# Patient Record
Sex: Male | Born: 1965 | Race: Black or African American | Hispanic: No | Marital: Single | Smoking: Former smoker
Health system: Southern US, Community
[De-identification: ages and names within clinical notes are randomized; demographics above are authoritative.]

## PROBLEM LIST (undated history)

## (undated) DIAGNOSIS — I482 Chronic atrial fibrillation, unspecified: Secondary | ICD-10-CM

## (undated) DIAGNOSIS — J189 Pneumonia, unspecified organism: Secondary | ICD-10-CM

## (undated) DIAGNOSIS — R9431 Abnormal electrocardiogram [ECG] [EKG]: Secondary | ICD-10-CM

## (undated) DIAGNOSIS — J9621 Acute and chronic respiratory failure with hypoxia: Secondary | ICD-10-CM

## (undated) DIAGNOSIS — R140 Abdominal distension (gaseous): Secondary | ICD-10-CM

## (undated) DIAGNOSIS — D62 Acute posthemorrhagic anemia: Secondary | ICD-10-CM

## (undated) DIAGNOSIS — F1410 Cocaine abuse, uncomplicated: Secondary | ICD-10-CM

## (undated) DIAGNOSIS — I472 Ventricular tachycardia: Secondary | ICD-10-CM

## (undated) DIAGNOSIS — R911 Solitary pulmonary nodule: Secondary | ICD-10-CM

## (undated) DIAGNOSIS — F1910 Other psychoactive substance abuse, uncomplicated: Secondary | ICD-10-CM

## (undated) DIAGNOSIS — I5043 Acute on chronic combined systolic (congestive) and diastolic (congestive) heart failure: Secondary | ICD-10-CM

## (undated) DIAGNOSIS — F1010 Alcohol abuse, uncomplicated: Secondary | ICD-10-CM

## (undated) DIAGNOSIS — Z992 Dependence on renal dialysis: Secondary | ICD-10-CM

## (undated) DIAGNOSIS — Z91199 Patient's noncompliance with other medical treatment and regimen due to unspecified reason: Secondary | ICD-10-CM

## (undated) DIAGNOSIS — K4090 Unilateral inguinal hernia, without obstruction or gangrene, not specified as recurrent: Secondary | ICD-10-CM

## (undated) DIAGNOSIS — I428 Other cardiomyopathies: Secondary | ICD-10-CM

## (undated) DIAGNOSIS — I5040 Unspecified combined systolic (congestive) and diastolic (congestive) heart failure: Secondary | ICD-10-CM

## (undated) DIAGNOSIS — I10 Essential (primary) hypertension: Secondary | ICD-10-CM

## (undated) DIAGNOSIS — N186 End stage renal disease: Secondary | ICD-10-CM

## (undated) DIAGNOSIS — I132 Hypertensive heart and chronic kidney disease with heart failure and with stage 5 chronic kidney disease, or end stage renal disease: Secondary | ICD-10-CM

## (undated) DIAGNOSIS — G2581 Restless legs syndrome: Secondary | ICD-10-CM

## (undated) DIAGNOSIS — F17210 Nicotine dependence, cigarettes, uncomplicated: Secondary | ICD-10-CM

## (undated) DIAGNOSIS — R0600 Dyspnea, unspecified: Secondary | ICD-10-CM

## (undated) DIAGNOSIS — R05 Cough: Secondary | ICD-10-CM

## (undated) DIAGNOSIS — K819 Cholecystitis, unspecified: Secondary | ICD-10-CM

## (undated) DIAGNOSIS — I2699 Other pulmonary embolism without acute cor pulmonale: Secondary | ICD-10-CM

## (undated) DIAGNOSIS — Z9119 Patient's noncompliance with other medical treatment and regimen: Secondary | ICD-10-CM

## (undated) DIAGNOSIS — I509 Heart failure, unspecified: Secondary | ICD-10-CM

## (undated) HISTORY — DX: Ventricular tachycardia: I47.2

## (undated) HISTORY — DX: Cocaine abuse, uncomplicated: F14.10

## (undated) HISTORY — DX: Cholecystitis, unspecified: K81.9

## (undated) HISTORY — DX: Cough: R05

## (undated) HISTORY — DX: Patient's noncompliance with other medical treatment and regimen: Z91.19

## (undated) HISTORY — DX: Other cardiomyopathies: I42.8

## (undated) HISTORY — DX: Unilateral inguinal hernia, without obstruction or gangrene, not specified as recurrent: K40.90

## (undated) HISTORY — DX: Abdominal distension (gaseous): R14.0

## (undated) HISTORY — DX: Unspecified combined systolic (congestive) and diastolic (congestive) heart failure: I50.40

## (undated) HISTORY — DX: Alcohol abuse, uncomplicated: F10.10

## (undated) HISTORY — DX: Solitary pulmonary nodule: R91.1

## (undated) HISTORY — DX: End stage renal disease: N18.6

## (undated) HISTORY — DX: Patient's noncompliance with other medical treatment and regimen due to unspecified reason: Z91.199

## (undated) HISTORY — DX: Nicotine dependence, cigarettes, uncomplicated: F17.210

## (undated) HISTORY — DX: Hypocalcemia: E83.51

## (undated) HISTORY — DX: Hypertensive heart and chronic kidney disease with heart failure and with stage 5 chronic kidney disease, or end stage renal disease: I13.2

## (undated) HISTORY — DX: Abnormal electrocardiogram (ECG) (EKG): R94.31

## (undated) HISTORY — DX: Essential (primary) hypertension: I10

## (undated) HISTORY — DX: Acute posthemorrhagic anemia: D62

## (undated) HISTORY — DX: Pneumonia, unspecified organism: J18.9

## (undated) HISTORY — DX: Other psychoactive substance abuse, uncomplicated: F19.10

---

## 2008-04-16 ENCOUNTER — Emergency Department (HOSPITAL_COMMUNITY): Admission: EM | Admit: 2008-04-16 | Discharge: 2008-04-17 | Payer: Self-pay | Admitting: Emergency Medicine

## 2009-06-20 ENCOUNTER — Inpatient Hospital Stay (HOSPITAL_COMMUNITY): Admission: EM | Admit: 2009-06-20 | Discharge: 2009-06-24 | Payer: Self-pay | Admitting: Emergency Medicine

## 2009-06-29 ENCOUNTER — Emergency Department (HOSPITAL_COMMUNITY): Admission: EM | Admit: 2009-06-29 | Discharge: 2009-06-29 | Payer: Self-pay | Admitting: Emergency Medicine

## 2009-07-13 ENCOUNTER — Ambulatory Visit: Payer: Self-pay | Admitting: Internal Medicine

## 2009-07-13 DIAGNOSIS — N186 End stage renal disease: Secondary | ICD-10-CM

## 2009-07-13 DIAGNOSIS — I132 Hypertensive heart and chronic kidney disease with heart failure and with stage 5 chronic kidney disease, or end stage renal disease: Secondary | ICD-10-CM

## 2009-07-13 HISTORY — DX: End stage renal disease: N18.6

## 2009-07-13 HISTORY — DX: Hypertensive heart and chronic kidney disease with heart failure and with stage 5 chronic kidney disease, or end stage renal disease: I13.2

## 2009-07-20 ENCOUNTER — Encounter (INDEPENDENT_AMBULATORY_CARE_PROVIDER_SITE_OTHER): Payer: Self-pay | Admitting: Internal Medicine

## 2009-07-20 LAB — CONVERTED CEMR LAB
BUN: 42 mg/dL — ABNORMAL HIGH (ref 6–23)
Calcium: 9.1 mg/dL (ref 8.4–10.5)
Chloride: 109 meq/L (ref 96–112)
Creatinine, Ser: 4.86 mg/dL — ABNORMAL HIGH (ref 0.40–1.50)

## 2009-11-13 ENCOUNTER — Telehealth (INDEPENDENT_AMBULATORY_CARE_PROVIDER_SITE_OTHER): Payer: Self-pay | Admitting: Internal Medicine

## 2009-11-28 ENCOUNTER — Encounter (INDEPENDENT_AMBULATORY_CARE_PROVIDER_SITE_OTHER): Payer: Self-pay | Admitting: *Deleted

## 2010-02-26 ENCOUNTER — Telehealth (INDEPENDENT_AMBULATORY_CARE_PROVIDER_SITE_OTHER): Payer: Self-pay | Admitting: Internal Medicine

## 2010-03-12 ENCOUNTER — Encounter (INDEPENDENT_AMBULATORY_CARE_PROVIDER_SITE_OTHER): Payer: Self-pay | Admitting: *Deleted

## 2010-03-18 ENCOUNTER — Encounter (INDEPENDENT_AMBULATORY_CARE_PROVIDER_SITE_OTHER): Payer: Self-pay | Admitting: Internal Medicine

## 2010-03-18 ENCOUNTER — Telehealth (INDEPENDENT_AMBULATORY_CARE_PROVIDER_SITE_OTHER): Payer: Self-pay | Admitting: Internal Medicine

## 2010-04-20 ENCOUNTER — Emergency Department (HOSPITAL_COMMUNITY): Admission: EM | Admit: 2010-04-20 | Discharge: 2010-04-20 | Payer: Self-pay | Admitting: Emergency Medicine

## 2010-07-14 ENCOUNTER — Emergency Department (HOSPITAL_COMMUNITY): Admission: EM | Admit: 2010-07-14 | Discharge: 2010-07-14 | Payer: Self-pay | Admitting: Emergency Medicine

## 2010-07-22 ENCOUNTER — Emergency Department (HOSPITAL_COMMUNITY): Admission: EM | Admit: 2010-07-22 | Discharge: 2010-07-22 | Payer: Self-pay | Admitting: Emergency Medicine

## 2010-10-20 ENCOUNTER — Inpatient Hospital Stay (HOSPITAL_COMMUNITY): Admission: EM | Admit: 2010-10-20 | Discharge: 2010-10-26 | Payer: Self-pay | Admitting: Emergency Medicine

## 2010-10-21 ENCOUNTER — Encounter (INDEPENDENT_AMBULATORY_CARE_PROVIDER_SITE_OTHER): Payer: Self-pay | Admitting: Internal Medicine

## 2010-10-22 ENCOUNTER — Encounter (INDEPENDENT_AMBULATORY_CARE_PROVIDER_SITE_OTHER): Payer: Self-pay | Admitting: Nephrology

## 2010-10-23 ENCOUNTER — Ambulatory Visit: Payer: Self-pay | Admitting: Vascular Surgery

## 2010-10-26 ENCOUNTER — Encounter (INDEPENDENT_AMBULATORY_CARE_PROVIDER_SITE_OTHER): Payer: Self-pay | Admitting: Internal Medicine

## 2010-11-26 ENCOUNTER — Ambulatory Visit: Payer: Self-pay | Admitting: Internal Medicine

## 2010-11-26 ENCOUNTER — Telehealth (INDEPENDENT_AMBULATORY_CARE_PROVIDER_SITE_OTHER): Payer: Self-pay | Admitting: Internal Medicine

## 2010-11-26 ENCOUNTER — Encounter (INDEPENDENT_AMBULATORY_CARE_PROVIDER_SITE_OTHER): Payer: Self-pay | Admitting: Internal Medicine

## 2010-11-26 DIAGNOSIS — F1410 Cocaine abuse, uncomplicated: Secondary | ICD-10-CM

## 2010-11-26 DIAGNOSIS — F1010 Alcohol abuse, uncomplicated: Secondary | ICD-10-CM

## 2010-11-26 DIAGNOSIS — N186 End stage renal disease: Secondary | ICD-10-CM | POA: Insufficient documentation

## 2010-11-26 DIAGNOSIS — Z992 Dependence on renal dialysis: Secondary | ICD-10-CM

## 2010-11-26 DIAGNOSIS — F17210 Nicotine dependence, cigarettes, uncomplicated: Secondary | ICD-10-CM

## 2010-11-26 HISTORY — DX: Nicotine dependence, cigarettes, uncomplicated: F17.210

## 2010-11-26 HISTORY — DX: Cocaine abuse, uncomplicated: F14.10

## 2010-11-26 HISTORY — DX: Alcohol abuse, uncomplicated: F10.10

## 2010-11-26 LAB — CONVERTED CEMR LAB
ALT: 8 units/L (ref 0–53)
AST: 11 units/L (ref 0–37)
Albumin: 4.4 g/dL (ref 3.5–5.2)
Basophils Absolute: 0 10*3/uL (ref 0.0–0.1)
Basophils Relative: 0 % (ref 0–1)
Calcium: 9.4 mg/dL (ref 8.4–10.5)
Chloride: 102 meq/L (ref 96–112)
Lymphocytes Relative: 32 % (ref 12–46)
MCHC: 32.6 g/dL (ref 30.0–36.0)
Monocytes Relative: 10 % (ref 3–12)
Neutro Abs: 2.9 10*3/uL (ref 1.7–7.7)
Neutrophils Relative %: 53 % (ref 43–77)
Potassium: 4.2 meq/L (ref 3.5–5.3)
RBC: 4.73 M/uL (ref 4.22–5.81)
RDW: 16.2 % — ABNORMAL HIGH (ref 11.5–15.5)
Total Protein: 7.5 g/dL (ref 6.0–8.3)

## 2010-12-08 ENCOUNTER — Encounter (INDEPENDENT_AMBULATORY_CARE_PROVIDER_SITE_OTHER): Payer: Self-pay | Admitting: Internal Medicine

## 2010-12-17 ENCOUNTER — Encounter (INDEPENDENT_AMBULATORY_CARE_PROVIDER_SITE_OTHER): Payer: Self-pay | Admitting: *Deleted

## 2010-12-19 ENCOUNTER — Encounter (INDEPENDENT_AMBULATORY_CARE_PROVIDER_SITE_OTHER): Payer: Self-pay | Admitting: Internal Medicine

## 2010-12-19 ENCOUNTER — Telehealth (INDEPENDENT_AMBULATORY_CARE_PROVIDER_SITE_OTHER): Payer: Self-pay | Admitting: Internal Medicine

## 2010-12-26 ENCOUNTER — Encounter (INDEPENDENT_AMBULATORY_CARE_PROVIDER_SITE_OTHER): Payer: Self-pay | Admitting: *Deleted

## 2011-01-14 NOTE — Letter (Signed)
Summary: REFERRAL//[facility]   REFERRAL//[facility]   Imported By: Roland Earl 03/27/2010 15:44:15  _____________________________________________________________________  External Attachment:    Type:   Image     Comment:   External Document

## 2011-01-14 NOTE — Progress Notes (Signed)
  Phone Note Outgoing Call   Summary of Call: Rec'd request for refills on lopressor. Patient not seen. No f/u since July 2010. Need to contact him to arrange f/u before meds refilled. Initial call taken by: Richardson Dopp PA-C,  February 26, 2010 5:32 PM  Follow-up for Phone Call        left msg with lady to have pt call.  He will have to have eligibility also.  Was only seen one time for one time hosp f/u and has never done eligibility. Follow-up by: Shellia Carwin CMA,  February 27, 2010 9:41 AM  Additional Follow-up for Phone Call Additional follow up Details #1::        Left message on answering machine for pt to return call  Additional Follow-up by: Shellia Carwin CMA,  March 11, 2010 2:16 PM    Additional Follow-up for Phone Call Additional follow up Details #2::    Left message on answering machine for pt to return call @ [phone]. Will also mail pt. a letter since 3 attemps have been made to reach pt.

## 2011-01-14 NOTE — Letter (Signed)
Summary: Discharge Summary  Discharge Summary   Imported By: Roland Earl 11/15/2010 10:35:52  _____________________________________________________________________  External Attachment:    Type:   Image     Comment:   External Document

## 2011-01-14 NOTE — Letter (Signed)
Summary: FAXED REQUESTED RECORDS TO [place]  FAXED REQUESTED RECORDS TO [facility]   Imported By: Roland Earl 03/18/2010 15:53:03  _____________________________________________________________________  External Attachment:    Type:   Image     Comment:   External Document

## 2011-01-14 NOTE — Progress Notes (Signed)
  Phone Note Call from Patient Call back at Home Phone [phone] Call back at [phone]   Summary of Call: Pt states that he was returning a phone called back from Dr [name], and he also wants to share that he needs refills from two bp medications [name] [name] ([place] [id number]) [name] MD Initial call taken by: Alexis Goodell,  March 18, 2010 12:16 PM  Follow-up for Phone Call        Pt has to have eligibility and an ov.  He has only been seen one time as a one time hosp f/u and has never had his elgibility done. Follow-up by: Shellia Carwin CMA,  March 18, 2010 4:20 PM  Additional Follow-up for Phone Call Additional follow up Details #1::        left a message to the pt to call back.Alexis Goodell  March 22, 2010 3:54 PM  left a message to the pt to call back.Alexis Goodell  March 25, 2010 1:15 PM  Left a message to the pt to call back.Alexis Goodell  March 28, 2010 1:52 PM He had set up an eligibility appointment on May 06, 2010 at 10:15.Alexis Goodell  March 28, 2010 1:58 PM    Additional Follow-up for Phone Call Additional follow up Details #2::    Thank youl. Follow-up by: Shellia Carwin CMA,  March 28, 2010 3:01 PM

## 2011-01-14 NOTE — Letter (Signed)
Summary: *HSN Results Follow up  [place], [place] [address]   Phone: [phone]  Fax: [phone]      03/12/2010   [facility] [address], [place]  [address]   Dear  [name],                            ____S.Drinkard,FNP   ____D. Gore,FNP       ____B. McPherson,MD   ____V. Rankins,MD    ____E. Mulberry,MD    ____N. Hassell Done, FNP  ____D. Jobe Igo, MD    ____K. Tomma Lightning, MD    ____Other     This letter is to inform you that your recent test(s):  _______Pap Smear    _______Lab Test     _______X-ray __x__Prescription request    _______ is within acceptable limits  _______ requires a medication change  _______ requires a follow-up lab visit  ___xx____ requires a follow-up visit with your provider   Comments:  You must complete eligibility and have an appointment with your doctor before we can refill your medications.  Please call to schedule your appointments.  Thank you.       _________________________________________________________ If you have any questions, please contact our office                     Sincerely,  Shellia Carwin CMA [facility]

## 2011-01-14 NOTE — Consult Note (Addendum)
NAMEELWAY, MCCULLUM              ACCOUNT NO.:  [id number]  MEDICAL RECORD NO.:  [id number]          PATIENT TYPE:  INP  LOCATION:  [id number]                         FACILITY:  [facility]  PHYSICIAN:  Conrad W[place]res, MD       DATE OF BIRTH:  1966-10-16  DATE OF CONSULTATION: DATE OF DISCHARGE:                                CONSULTATION   REASON FOR CONSULTATION:  Hemodialysis access, permanent.  REQUESTING PHYSICIAN:  Windy Kalata, MD  Personal care physician at this time were the hospitalist.  HISTORY OF PRESENT ILLNESS:  This 45 year old black man was admitted through the emergency room with shortness of breath, chest pain, and fatigue.  He has a history of uncontrolled hypertension due to poor compliance with medications and position appointments.  He has had several admissions due to this and has been found to have been in hypertensive crisis.  He has also been evaluated in July 2012 by Dr. Hassell Done for kidney disease.  He was admitted to the CCU for evaluation and a renal consult was obtained.  His chest pain was controlled.  Significant laboratory findings revealed a sodium 139, potassium 2.4, bicarb 31, BUN of 38, and creatinine 5.2 at time of admission.  His drug screen was significant for cocaine.  He was felt at that time by Dr. Mercy Moore to have acute-on-chronic kidney disease versus stage IV chronic kidney disease secondary to hypertension and cocaine abuse.  Dr. Mercy Moore feels that he should proceed with placement for permanent access and has requesting our opinion.  PAST MEDICAL HISTORY:  Hypertension.  CURRENT MEDICATIONS: 1. Multivitamin with iron daily. 2. Folic acid p.o. daily. 3. Vitamin B1 p.o. daily. 4. Imdur 30 mg p.o. daily. 5. Aspirin 81 mg p.o. daily. 6. Norvasc 10 mg p.o. daily. 7. Catapres 0.1 mg p.o. every 8 hours.  FAMILY HISTORY:  Significant for hypertension and diabetes mellitus.  SOCIAL HISTORY:  Significant for alcohol use.  He does admit 2  beers per day.  He did have a positive cocaine screening, and admits to marijuana usage.  He is not married.  He does have two children, age 21 and 69; and he works for a [employer].  REVIEW OF SYSTEMS:  Significant for shortness of breath, chest pain, fatigue on admission, and a currently depressed appetite.  The rest of his symptoms was unremarkable.  PHYSICAL EXAMINATION:  VITAL SIGNS:  Blood pressure was 163/124 with a heart rate of 83, respiratory rate of 18. GENERAL:  This is a young black male in no apparent distress.  He is sleeping, but he woke when the lights were turned on. HEENT:  NCAT.  EOMI.  Sclerae were nonicteric.  Mucous membranes were pink and moist. NECK:  Evaluation of the neck demonstrates the trachea to be midline. CHEST:  Atraumatic. LUNGS:  Clear to auscultation. CARDIAC:  Regular rate and rhythm. ABDOMEN:  Soft, nontender, nondistended.  Bowel sounds are present. EXTREMITIES:  He was able to move all extremities.  He is right-handed. Vein mapping was obtained, which demonstrated adequate pain in the left arm.  He does have several puncture sites of his vein  systems due to IV. The IVs at this time, have been removed. NEUROLOGICAL:  Nonfocal. PSYCHOLOGICAL:  Appropriate to situation.  LABORATORY DATA:  As previously-described.  ASSESSMENT AND PLAN:  This 45 year old man has uncontrolled hypertension and chronic kidney disease.  He is nearing hemodialysis stage and will need access.  His veins have been mapped, and the mapping has been reviewed.  Currently, he has in cc but it should be more than just 600- 700 later today.  It may be possible that this access can be obtained during this admission.  Scheduled for admitting or certainly as an outpatient.  I will discuss this patient with Dr. Dorna Mai.  His hypertension should be treated per the hospitalist and renal physicians.     Chad Cordial, PA   ______________________________ Conrad Rock, MD    KEL/MEDQ  D:  10/23/2010  T:  10/24/2010  Job:  [id number]  Electronically Signed by Adele Barthel MD on 01/14/2011 07:36:19 AM

## 2011-01-16 NOTE — Progress Notes (Signed)
Summary: needs BP check  Phone Note From Pharmacy   Summary of Call: Request from [facility] pharmacy to switch to combination BiDil as Isosorbid Diniatrate is not covered alone. Please have pt. come in for bp check 2 weeks after switches Hydralazine and Isosorbid dinitrate to Bidil.  Nurse visit Initial call taken by: Mack Hook MD,  December 19, 2010 10:09 AM  Follow-up for Phone Call        called [phone] line busy Isla Pence  December 23, 2010 2:25 PM  [id number] # no longer in service, contact # [phone] Left message on answer machine for pt. to return call. Bridgett Larsson RN  December 25, 2010 9:34 AM      Additional Follow-up for Phone Call Additional follow up Details #1::        Left message on answer machine for pt. to return call [phone]. Will mail letter. Bridgett Larsson RN  December 26, 2010 9:22 AM        New/Updated Medications: BIDIL 20-37.5 MG TABS (ISOSORB DINITRATE-HYDRALAZINE) 1 tab by mouth three times a day Prescriptions: AMLODIPINE BESYLATE 10 MG TABS (AMLODIPINE BESYLATE) 1 tab by mouth daily  #90 x 1   Entered and Authorized by:   Mack Hook MD   Signed by:   Mack Hook MD on 12/19/2010   Method used:   Printed then faxed to ...       [facility] (retail)       [place], [place]  [address]       Ph: [phone]       Fax: [phone]   RxID:   [id number] BIDIL 20-37.5 MG TABS (ISOSORB DINITRATE-HYDRALAZINE) 1 tab by mouth three times a day  #90 x 6   Entered and Authorized by:   Mack Hook MD   Signed by:   Mack Hook MD on 12/19/2010   Method used:   Printed then faxed to ...       [facility] (retail)       [address] [place], [place]  [address]       Ph: [phone]       Fax: [phone]   RxID:   [id number]

## 2011-01-16 NOTE — Medication Information (Signed)
Summary: MAP FAXED //[facility]  [facility] FAXED //[facility]   Imported By: Roland Earl 12/19/2010 13:46:06  _____________________________________________________________________  External Attachment:    Type:   Image     Comment:   External Document

## 2011-01-16 NOTE — Assessment & Plan Note (Signed)
Summary: HOSPITAL F/U [place]//[facility]   Vital Signs:  Patient profile:   45 year old male Weight:      161 pounds BMI:     23.86 Temp:     97.8 degrees F oral Pulse rate:   76 / minute Pulse rhythm:   regular Resp:     14 per minute BP sitting:   118 / 80  (left arm)  Vitals Entered By: Aquilla Solian CMA (November 26, 2010 12:22 PM) CC: XF/U for BP. Pt. ran out of BP meds for a month. Was feeling very tired and not eating and having HA's.  Is Patient Diabetic? No Pain Assessment Patient in pain? no       Does patient need assistance? Functional Status Self care Ambulation Normal   CC:  XF/U for BP. Pt. ran out of BP meds for a month. Was feeling very tired and not eating and having HA's. .  History of Present Illness: 45 yo male here again as hospital follow up almost 1 1/2 years after one and only previous visit.  Pt. hospitalized 11/6 - 11-12 for Hypertensive emergency with renal and cardiac involvement.  Was found to have chronic Kidney disease, stage IV with this hospitalization and to have cardiomyopathy with bost systolic and diastolic CHF.   Pt. states he has made some bad choices and has not made appts as he should for followup.  States his cocaine use has nothing to do with it.  1.  Hypertension:  Has remained on bp meds since hospitalization--will run out in 2 days, however.   2.  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  3.  CKD, stage IV:  Renal ultrasound with no hydronephrosis and changes consistent with medical renal disease bilaterally.  [name] [name] refused placement of AV fistula   4.  Alcohol and cocaine abuse;  pt. feels this is really not a big problem for him and is not interested in referral to [facility] or other form of treatment.  Calls his mother when tempted to  use.  Current Medications (verified): 1)  Norvasc 10 Mg Tabs (Amlodipine Besylate) .Marland Kitchen.. 1 By Mouth Once Daily 2)  Catapres 0.3 Mg Tabs (Clonidine Hcl) .Marland Kitchen.. 1 By Mouth Two Times A Day 3)  Lopressor 50 Mg Tabs (Metoprolol Tartrate) .Marland Kitchen.. 1 By Mouth Two Times A Day 4)  Amlodipine Besylate 10 Mg Tabs (Amlodipine Besylate) .... Take One Tablet By Mouth Every Day 5)  Isosorbide Dinitrate 20 Mg Tabs (Isosorbide Dinitrate) .... Take One Tablet By Mouth Three Times Daily 6)  Hydralazine Hcl 50 Mg Tabs (Hydralazine Hcl) .... Take One Tablet By Mouth 4 Times Daily 7)  Furosemide 40 Mg Tabs (Furosemide) .... Take One Tablet By Mouth Twice Daily 8)  Rocaltrol 0.25 Mcg Caps (Calcitriol) .... Take One Capsule By Mouth Every Day 9)  Clonidine Hcl 0.2 Mg Tabs (Clonidine Hcl) .... Take One Tablet By Mouth Every 8 Hours  Allergies (verified): No Known Drug Allergies  Family History: Mother, 84:  DM, Hypertension, overweight Father, 19:  Hypertension, Gout 3 Siblings:  brother and sister have hypertension--not sure if another sister has hypertension or not. 2 Biologic Children:  78 and 20:  Healthy  Social History: Currently unemployed [place] married Lives with mother. Children live with their mother here in town No significant other Tobacco Use:  Started age 89 yo.  Usually  smokes when drinking.  Smokes maybe 5 cigarettes in one week. Alcohol Use:  One beer daily.  Has had overuse in past, but not an issue currently. Drug Use:  Hx of drug abuse in 20s--Crack cocaine and cocaine.  Never used anything IV.  Started using Cocaine:  snorting powder in past year--used weekly.  Still using MJ every day.    Physical Exam  General:  NAD--appears much younger than stated age. Lungs:  Normal respiratory effort, chest expands symmetrically. Lungs are clear to auscultation, no crackles or wheezes. Heart:  Normal rate and regular rhythm. S1 and S2 normal without gallop, murmur, click, rub or other extra  sounds.  No S3 or S4 appreciated.  Radial pulses normal and equal Abdomen:  Bowel sounds positive,abdomen soft and non-tender without masses, organomegaly or hernias noted. Extremities:  Trace edema--can see slight indentation from socks.   Impression & Recommendations:  Problem # 1:  CARDIOMYOPATHY, HYPERTENSIVE (ICD-402.90) Consider switch from Clonidine to Labetalol if keeps next visit. BP currently controlled If remains complian, consider echo in a few months to evaluate for any improvement in function The following medications were removed from the medication list:    Lopressor 50 Mg Tabs (Metoprolol tartrate) .Marland Kitchen... 1 by mouth two times a day His updated medication list for this problem includes:    Amlodipine Besylate 10 Mg Tabs (Amlodipine besylate) .Marland Kitchen... 1 tab by mouth daily    Clonidine Hcl 0.3 Mg Tabs (Clonidine hcl) .Marland Kitchen... 1 tab by mouth two times a day    Furosemide 40 Mg Tabs (Furosemide) .Marland Kitchen... 1 tab by mouth two times a day    Hydralazine Hcl 50 Mg Tabs (Hydralazine hcl) .Marland Kitchen... 1 tab by mouth 4 times daily  Orders: T-Lipid Profile [id number]) T-Comprehensive Metabolic Panel (A999333) UA Dipstick w/o Micro (manual) [id number]) Nephrology Referral (Nephro) T-CBC w/Diff [phone])  Problem # 2:  CHRONIC KIDNEY DISEASE STAGE IV (SEVERE) (ICD-585.4)  Orders: T-Lipid Profile [id number]) T-Comprehensive Metabolic Panel (A999333) UA Dipstick w/o Micro (manual) [id number]) Nephrology Referral (Nephro) T-CBC w/Diff [id number])  Problem # 3:  COCAINE ABUSE (ICD-305.60) and alcohol abuse--pt. denies this, but apparently had mild withdrawal in hospital. He does not feel he needs to pursue treatment for addicton. Will continue to encourage him to do so.   Orders: T-Lipid Profile [id number]) T-Comprehensive Metabolic Panel (A999333) UA Dipstick w/o Micro (manual) (81002) T-CBC w/Diff [id number])  Complete Medication List: 1)  Amlodipine Besylate 10 Mg  Tabs (Amlodipine besylate) .Marland Kitchen.. 1 tab by mouth daily 2)  Clonidine Hcl 0.3 Mg Tabs (Clonidine hcl) .Marland Kitchen.. 1 tab by mouth two times a day 3)  Isosorbide Dinitrate 20 Mg Tabs (Isosorbide dinitrate) .Marland Kitchen.. 1 tab by mouth three times a day 4)  Furosemide 40 Mg Tabs (Furosemide) .Marland Kitchen.. 1 tab by mouth two times a day 5)  Hydralazine Hcl 50 Mg Tabs (Hydralazine hcl) .Marland Kitchen.. 1 tab by mouth 4 times daily 6)  Aspirin 81 Mg Tbec (Aspirin) .Marland Kitchen.. 1 tab by mouth daily 7)  Calcitriol 0.25 Mcg Caps (Calcitriol) .Marland Kitchen.. 1 tab by mouth daily 8)  Thiamine Hcl 100 Mg Tabs (Thiamine hcl) .Marland Kitchen.. 1 tab by mouth daily  Other Orders: Flu Vaccine 60yrs + [id number]) Admin 1st Vaccine [id number])  Patient Instructions: 1)  Follow up with Dr. [name] in 2 months --cardiomyopathy, hypertension Prescriptions: THIAMINE HCL 100 MG TABS (THIAMINE HCL) 1 tab by mouth daily  #30 x 6   Entered and Authorized by:   Mack Hook MD   Signed by:   Mack Hook  MD on 11/26/2010   Method used:   Faxed to ...       [facility] (retail)       [place], [place]  [address]       Ph: [phone] [place]       Fax: [phone]   RxID:   [id number] CALCITRIOL 0.25 MCG CAPS (CALCITRIOL) 1 tab by mouth daily  #30 x 6   Entered and Authorized by:   Mack Hook MD   Signed by:   Mack Hook MD on 11/26/2010   Method used:   Faxed to ...       [facility] (retail)       [place], [place]  [address]       Ph: [phone] [place]       Fax: [phone]   RxID:   [id number] HYDRALAZINE HCL 50 MG TABS (HYDRALAZINE HCL) 1 tab by mouth 4 times daily  #120 x 6   Entered and Authorized by:   Mack Hook MD   Signed by:   Mack Hook MD on 11/26/2010   Method used:   Faxed to ...       [facility] (retail)       [place], [place]  [address]       Ph:  [phone] [place]       Fax: [phone]   RxID:   [id number] FUROSEMIDE 40 MG TABS (FUROSEMIDE) 1 tab by mouth two times a day  #60 x 6   Entered and Authorized by:   Mack Hook MD   Signed by:   Mack Hook MD on 11/26/2010   Method used:   Faxed to ...       [facility] (retail)       [place], [place]  [address]       Ph: [phone]       Fax: [phone]   RxID:   [id number] ISOSORBIDE DINITRATE 20 MG TABS (ISOSORBIDE DINITRATE) 1 tab by mouth three times a day  #90 x 6   Entered and Authorized by:   Mack Hook MD   Signed by:   Mack Hook MD on 11/26/2010   Method used:   Faxed to ...       [facility] (retail)       [place], [place]  [address]       Ph: [phone] [place]       Fax: [phone]   RxID:   [id number] CLONIDINE HCL 0.3 MG TABS (CLONIDINE HCL) 1 tab by mouth two times a day  #60 x 6   Entered and Authorized by:   Mack Hook MD   Signed by:   Mack Hook MD on 11/26/2010   Method used:   Faxed to ...       [facility] (retail)       [place], [place]  [address]       Ph: [phone] [place]       Fax: [phone]   RxID:   [id number] AMLODIPINE BESYLATE 10 MG TABS (AMLODIPINE BESYLATE) 1 tab by mouth daily  #30 x  6   Entered and Authorized by:   Mack Hook MD   Signed by:   Mack Hook MD on 11/26/2010   Method used:   Faxed to ...       [facility] (retail)       [place], [place]  [address]       Ph: [phone] [place]       Fax: [phone]   RxID:   [id number]    Orders Added: 1)  Flu Vaccine 62yrs + [id number] 2)  Admin 1st Vaccine [id number] 3)  T-Lipid Profile [id number] 4)  T-Comprehensive Metabolic Panel [id number] 5)  UA Dipstick w/o  Micro (manual) [81002] 6)  Nephrology Referral [Nephro] 7)  T-CBC w/Diff [id number] 8)  Est. Patient Level IV [id number]   Immunizations Administered:  Influenza Vaccine # 1:    Vaccine Type: Fluvax 3+    Site: left deltoid    Mfr: GlaxoSmithKline    Dose: 0.5 ml    Route: IM    Given by: Aquilla Solian CMA    Exp. Date: 06/14/2011    Lot #: [id number]    VIS given: 07/09/10 version given November 26, 2010.  Flu Vaccine Consent Questions:    Do you have a history of severe allergic reactions to this vaccine? no    Any prior history of allergic reactions to egg and/or gelatin? no    Do you have a sensitivity to the preservative Thimersol? no    Do you have a past history of Guillan-Barre Syndrome? no    Do you currently have an acute febrile illness? no    Have you ever had a severe reaction to latex? no    Vaccine information given and explained to patient? yes   Immunizations Administered:  Influenza Vaccine # 1:    Vaccine Type: Fluvax 3+    Site: left deltoid    Mfr: GlaxoSmithKline    Dose: 0.5 ml    Route: IM    Given by: Aquilla Solian CMA    Exp. Date: 06/14/2011    Lot #: [id number]    VIS given: 07/09/10 version given November 26, 2010.   Appended Document: HOSPITAL F/U [place]//[facility]    Nurse Visit   Allergies: No Known Drug Allergies Laboratory Results   Urine Tests  Date/Time Received: November 26, 2010 2:56 PM   Routine Urinalysis   Color: yellow Glucose: negative   (Normal Range: Negative) Bilirubin: negative   (Normal Range: Negative) Ketone: negative   (Normal Range: Negative) Spec. Gravity: 1.020   (Normal Range: 1.003-1.035) Blood: negative   (Normal Range: Negative) pH: 5.0   (Normal Range: 5.0-8.0) Protein: >=300   (Normal Range: Negative) Urobilinogen: 0.2   (Normal Range: 0-1) Nitrite: negative   (Normal Range: Negative) Leukocyte Esterace: negative   (Normal Range: Negative)

## 2011-01-16 NOTE — Letter (Signed)
Summary: *HSN Results Follow up  [facility]  [address] [place], [place] [address]   Phone: [phone]  Fax: [phone]      12/17/2010   [facility] [address] [place], [place]  [address]   Dear  [name],                            Comments: MR [place] WE HAVE BEEN TRYING TO REACH YOU PLEASE, CALL [facility] TO SCHEDULE AN APPOINTMENT . THANK YOU FOR YOU ATTENTION        _________________________________________________________ If you have any questions, please contact our office                     Sincerely,  Maren Reamer [facility]

## 2011-01-16 NOTE — Letter (Signed)
Summary: Lipid Letter  [facility]  [address] [place], [place] [address]   Phone: [phone]  Fax: [phone]    12/08/2010  [name] [name] [address], [place]  [address]  Dear [name] [name]:  We have carefully reviewed your last lipid profile from 11/26/2010 and the results are noted below with a summary of recommendations for lipid management.    Cholesterol:       159     Goal: <200   HDL "good" Cholesterol:   96     Goal: >45   LDL "bad" Cholesterol:   43     Goal: <70   Triglycerides:       102     Goal: <150    Your cholesterol is excellent.  Your kidney status is about the same.  Your blood counts are okay.  Please follow up as planned    TLC Diet (Therapeutic Lifestyle Change): Saturated Fats & Transfatty acids should be kept < 7% of total calories ***Reduce Saturated Fats Polyunstaurated Fat can be up to 10% of total calories Monounsaturated Fat Fat can be up to 20% of total calories Total Fat should be no greater than 25-35% of total calories Carbohydrates should be 50-60% of total calories Protein should be approximately 15% of total calories Fiber should be at least 20-30 grams a day ***Increased fiber may help lower LDL Total Cholesterol should be < 200mg /day Consider adding plant stanol/sterols to diet (example: Benacol spread) ***A higher intake of unsaturated fat may reduce Triglycerides and Increase HDL    Adjunctive Measures (may lower LIPIDS and reduce risk of Heart Attack) include: Aerobic Exercise (20-30 minutes 3-4 times a week) Limit Alcohol Consumption Weight Reduction Aspirin 75-81 mg a day by mouth (if not allergic or contraindicated) Dietary Fiber 20-30 grams a day by mouth     Current Medications: 1)    Amlodipine Besylate 10 Mg Tabs (Amlodipine besylate) .Marland Kitchen.. 1 tab by mouth daily 2)    Clonidine Hcl 0.3 Mg Tabs (Clonidine hcl) .Marland Kitchen.. 1 tab by mouth two times a day 3)    Isosorbide Dinitrate 20  Mg Tabs (Isosorbide dinitrate) .Marland Kitchen.. 1 tab by mouth three times a day 4)    Furosemide 40 Mg Tabs (Furosemide) .Marland Kitchen.. 1 tab by mouth two times a day 5)    Hydralazine Hcl 50 Mg Tabs (Hydralazine hcl) .Marland Kitchen.. 1 tab by mouth 4 times daily 6)    Aspirin 81 Mg Tbec (Aspirin) .Marland Kitchen.. 1 tab by mouth daily 7)    Calcitriol 0.25 Mcg Caps (Calcitriol) .Marland Kitchen.. 1 tab by mouth daily 8)    Thiamine Hcl 100 Mg Tabs (Thiamine hcl) .Marland Kitchen.. 1 tab by mouth daily  If you have any questions, please call. We appreciate being able to work with you.   Sincerely,    [facility]

## 2011-01-16 NOTE — Progress Notes (Signed)
Summary: Renal referral  Phone Note Outgoing Call   Summary of Call: NOra--referral to Nephrology Initial call taken by: Mack Hook MD,  November 26, 2010 1:55 PM  Follow-up for Phone Call        [facility] [place] [facility] APPT Follow-up by: Maren Reamer,  November 27, 2010 6:08 PM

## 2011-01-16 NOTE — Letter (Signed)
Summary: Generic Letter  [facility]  [address] [place], [place] [address]   Phone: [phone]  Fax: [phone]        12/26/2010  [facility] [address] [place], [place]  [address]  Dear Mr. Gessel,  We have been unable to reach you by phone. Please call our office with your new contact information. Your blood pressure medication was changed so we need to have you come in for a blood pressure check two weeks after you have started the new medication.       Sincerely,   Bridgett Larsson RN

## 2011-02-25 LAB — COMPREHENSIVE METABOLIC PANEL
ALT: 11 U/L (ref 0–53)
AST: 17 U/L (ref 0–37)
AST: 18 U/L (ref 0–37)
Albumin: 2.5 g/dL — ABNORMAL LOW (ref 3.5–5.2)
Albumin: 2.6 g/dL — ABNORMAL LOW (ref 3.5–5.2)
Alkaline Phosphatase: 51 U/L (ref 39–117)
Alkaline Phosphatase: 56 U/L (ref 39–117)
CO2: 29 mEq/L (ref 19–32)
Chloride: 103 mEq/L (ref 96–112)
Chloride: 105 mEq/L (ref 96–112)
GFR calc Af Amer: 13 mL/min — ABNORMAL LOW (ref >60)
GFR calc Af Amer: 15 mL/min — ABNORMAL LOW (ref >60)
Potassium: 3.6 mEq/L (ref 3.5–5.1)
Potassium: 4 mEq/L (ref 3.5–5.1)
Sodium: 138 mEq/L (ref 135–145)
Total Bilirubin: 0.4 mg/dL (ref 0.3–1.2)
Total Bilirubin: 0.4 mg/dL (ref 0.3–1.2)

## 2011-02-25 LAB — PTH, INTACT AND CALCIUM: Calcium, Total (PTH): 8.2 mg/dL — ABNORMAL LOW (ref 8.4–10.5)

## 2011-02-25 LAB — BASIC METABOLIC PANEL WITH GFR
BUN: 37 mg/dL — ABNORMAL HIGH (ref 6–23)
Creatinine, Ser: 5.21 mg/dL — ABNORMAL HIGH (ref 0.4–1.5)
GFR calc non Af Amer: 12 mL/min — ABNORMAL LOW (ref >60)
Glucose, Bld: 92 mg/dL (ref 70–99)

## 2011-02-25 LAB — CK TOTAL AND CKMB (NOT AT ARMC)
CK, MB: 5.3 ng/mL — ABNORMAL HIGH (ref 0.3–4.0)
Relative Index: INVALID (ref 0.0–2.5)
Total CK: 74 U/L (ref 7–232)

## 2011-02-25 LAB — URINE MICROSCOPIC-ADD ON

## 2011-02-25 LAB — DIFFERENTIAL
Basophils Absolute: 0.1 K/uL (ref 0.0–0.1)
Basophils Relative: 1 % (ref 0–1)
Eosinophils Absolute: 0.3 10*3/uL (ref 0.0–0.7)
Eosinophils Relative: 6 % — ABNORMAL HIGH (ref 0–5)
Lymphocytes Relative: 32 % (ref 12–46)
Lymphs Abs: 1.8 10*3/uL (ref 0.7–4.0)
Monocytes Absolute: 0.5 K/uL (ref 0.1–1.0)
Monocytes Relative: 9 % (ref 3–12)
Neutro Abs: 2.9 K/uL (ref 1.7–7.7)
Neutrophils Relative %: 51 % (ref 43–77)

## 2011-02-25 LAB — RAPID URINE DRUG SCREEN, HOSP PERFORMED
Amphetamines: NOT DETECTED
Barbiturates: NOT DETECTED
Benzodiazepines: NOT DETECTED
Cocaine: POSITIVE — AB
Opiates: NOT DETECTED
Tetrahydrocannabinol: POSITIVE — AB

## 2011-02-25 LAB — LIPID PANEL
LDL Cholesterol: 34 mg/dL (ref 0–99)
Total CHOL/HDL Ratio: 1.8 RATIO
Triglycerides: 67 mg/dL (ref <150)
VLDL: 13 mg/dL (ref 0–40)

## 2011-02-25 LAB — HEPATIC FUNCTION PANEL
ALT: 13 U/L (ref 0–53)
AST: 31 U/L (ref 0–37)
Albumin: 3 g/dL — ABNORMAL LOW (ref 3.5–5.2)
Alkaline Phosphatase: 72 U/L (ref 39–117)
Bilirubin, Direct: 0.3 mg/dL (ref 0.0–0.3)
Indirect Bilirubin: 0.8 mg/dL (ref 0.3–0.9)
Total Bilirubin: 1.1 mg/dL (ref 0.3–1.2)
Total Protein: 6.6 g/dL (ref 6.0–8.3)

## 2011-02-25 LAB — CBC
HCT: 33.4 % — ABNORMAL LOW (ref 39.0–52.0)
HCT: 34.7 % — ABNORMAL LOW (ref 39.0–52.0)
HCT: 38.4 % — ABNORMAL LOW (ref 39.0–52.0)
Hemoglobin: 11.3 g/dL — ABNORMAL LOW (ref 13.0–17.0)
Hemoglobin: 12.1 g/dL — ABNORMAL LOW (ref 13.0–17.0)
Hemoglobin: 13.1 g/dL (ref 13.0–17.0)
MCH: 28.9 pg (ref 26.0–34.0)
MCH: 29 pg (ref 26.0–34.0)
MCHC: 34.1 g/dL (ref 30.0–36.0)
MCV: 84.4 fL (ref 78.0–100.0)
MCV: 84.8 fL (ref 78.0–100.0)
Platelets: 270 K/uL (ref 150–400)
RBC: 3.89 MIL/uL — ABNORMAL LOW (ref 4.22–5.81)
RBC: 4.53 MIL/uL (ref 4.22–5.81)
RDW: 14.8 % (ref 11.5–15.5)
WBC: 5.6 10*3/uL (ref 4.0–10.5)
WBC: 5.6 K/uL (ref 4.0–10.5)

## 2011-02-25 LAB — MRSA PCR SCREENING
MRSA by PCR: NEGATIVE
MRSA by PCR: NEGATIVE

## 2011-02-25 LAB — PROTIME-INR
INR: 1 (ref 0.00–1.49)
Prothrombin Time: 13.4 s (ref 11.6–15.2)

## 2011-02-25 LAB — BASIC METABOLIC PANEL
BUN: 38 mg/dL — ABNORMAL HIGH (ref 6–23)
BUN: 40 mg/dL — ABNORMAL HIGH (ref 6–23)
BUN: 47 mg/dL — ABNORMAL HIGH (ref 6–23)
BUN: 52 mg/dL — ABNORMAL HIGH (ref 6–23)
CO2: 26 mEq/L (ref 19–32)
CO2: 27 mEq/L (ref 19–32)
Calcium: 8.3 mg/dL — ABNORMAL LOW (ref 8.4–10.5)
Calcium: 8.4 mg/dL (ref 8.4–10.5)
Calcium: 8.6 mg/dL (ref 8.4–10.5)
Chloride: 106 mEq/L (ref 96–112)
Chloride: 90 mEq/L — ABNORMAL LOW (ref 96–112)
Chloride: 98 mEq/L (ref 96–112)
Creatinine, Ser: 5.25 mg/dL — ABNORMAL HIGH (ref 0.4–1.5)
GFR calc Af Amer: 14 mL/min — ABNORMAL LOW (ref >60)
GFR calc Af Amer: 15 mL/min — ABNORMAL LOW (ref >60)
GFR calc non Af Amer: 11 mL/min — ABNORMAL LOW (ref >60)
GFR calc non Af Amer: 12 mL/min — ABNORMAL LOW (ref >60)
Glucose, Bld: 104 mg/dL — ABNORMAL HIGH (ref 70–99)
Glucose, Bld: 66 mg/dL — ABNORMAL LOW (ref 70–99)
Glucose, Bld: 84 mg/dL (ref 70–99)
Potassium: 2.5 mEq/L — CL (ref 3.5–5.1)
Potassium: 3.4 mEq/L — ABNORMAL LOW (ref 3.5–5.1)
Potassium: 4 mEq/L (ref 3.5–5.1)
Sodium: 129 mEq/L — ABNORMAL LOW (ref 135–145)
Sodium: 139 mEq/L (ref 135–145)

## 2011-02-25 LAB — URINALYSIS, ROUTINE W REFLEX MICROSCOPIC
Bilirubin Urine: NEGATIVE
Glucose, UA: NEGATIVE mg/dL
Ketones, ur: NEGATIVE mg/dL
Leukocytes, UA: NEGATIVE
Protein, ur: >300 mg/dL — AB

## 2011-02-25 LAB — PHOSPHORUS: Phosphorus: 4.7 mg/dL — ABNORMAL HIGH (ref 2.3–4.6)

## 2011-02-25 LAB — CARDIAC PANEL(CRET KIN+CKTOT+MB+TROPI)
CK, MB: 5 ng/mL — ABNORMAL HIGH (ref 0.3–4.0)
Total CK: 42 U/L (ref 7–232)
Troponin I: 0.91 ng/mL (ref 0.00–0.06)
Troponin I: 0.98 ng/mL (ref 0.00–0.06)

## 2011-02-25 LAB — TROPONIN I: Troponin I: 1.06 ng/mL (ref 0.00–0.06)

## 2011-02-25 LAB — APTT: aPTT: 29 s (ref 24–37)

## 2011-02-25 LAB — TSH: TSH: 2.982 u[IU]/mL (ref 0.350–4.500)

## 2011-02-27 NOTE — Consult Note (Signed)
NAMEDONIVON, EULBERG              ACCOUNT NO.:  [id number]  MEDICAL RECORD NO.:  [id number]          PATIENT TYPE:  INP  LOCATION:  [id number]                         FACILITY:  [facility]  PHYSICIAN:  Curly Shores, MD      DATE OF BIRTH:  [birth year]  DATE OF CONSULTATION:  10/26/2010 DATE OF DISCHARGE:                                CONSULTATION   TIME OF CONSULT:  6:30 a.m.  REASON FOR CONSULTATION:  "Ventricular tachycardia."  HISTORY OF PRESENT ILLNESS:  A 45 year old African American male with a past medical history detailed below, presents to [facility] as a consult at the request of [facility] for further evaluation and recommendations regarding ventricular tachycardia seen on inpatient telemetry.  The patient was admitted approximately 5-6 days ago in the setting of positive urinary drug screen for cocaine, renal dysfunction and hypertensive urgency.  Tonight, the patient had approximately 11- second beat run of nonsustained ventricular tachycardia.  The patient denied any presyncopal or syncopal symptoms, chest pain, palpitations. No interventions were necessary.  The patient said he felt some minor "fluttering" in his heart.  Notably he is taking cocaine for over 15 years, has smoked marijuana extensively,  continues to smoke cigarettes and has a history of previous alcohol abuse.  ALLERGIES:  NONE REPORTED.  PAST MEDICAL HISTORY: 1. Hypertension. 2. Renal dysfunction.  The patient is currently being evaluated by     Nephrology to  determine whether he has CKD IV versus acute on     chronic renal disease.  He is currently hesitant to undergo     hemodialysis at this time.  MEDICATIONS:  The patient is not taking any medications, but previous discharge summary indicated that he is on antihypertensive therapy, but simply noncompliant.  SOCIAL HISTORY:  Patient has 2 adult children and 2 grandchildren.  He smokes approximately half a pack per day and has done  for the past 17 years.  He continues to smoke marijuana.  Drinks approximately 2 beers a day.  He does admit to doing cocaine and has done so for the past 15 years.  FAMILY HISTORY:  The patient has a positive history for hypertension and diabetes.  REVIEW OF SYSTEMS:  A 14-point review of systems is negative as stated above.  CODE STATUS:  The patient is a full code.  PHYSICAL EXAMINATION:  VITAL SIGNS:  Temperature 97.6, pulse is 67, respiratory rate 17, blood pressure is 138/92, 100% on room air. GENERAL:  No acute distress, but slightly anxious. HEENT:  NCAT, PERRL.  Dentition is fair.  EOMI. NECK:  Supple without lymphadenopathy.  No JVD. CVS:  Heart rate is normal.  Rhythm is regular.  S1 and S2 noted.  There is an S3. LUNGS:  Clear to auscultation without wheezes, rales or rhonchi. SKIN:  No rashes on the skin or lesions on the skin. ABDOMEN:  Soft, nontender, thin, otherwise NABS, otherwise benign. GU/RECTAL:  Deferred. EXTREMITIES:  No clubbing, cyanosis or edema is noted.  No rashes or lesions. MUSCULOSKELETAL:  No joint deformities, effusion, spine tenderness or CVA tenderness. NEURO:  Alert to person, place and time.  CN  II-XII grossly intact.  RADIOLOGY: 1. Chest x-ray shows no acute cardiopulmonary disease. 2. EKG:  Sinus rhythm with T-wave inversions from V2-V3, anterior     ischemia.  QTC is approximately 510.  LVH was noted. 3. Telemetry:  Monomorphic approximately 10-11 second run of     nonsustained ventricular tachycardia.  Ventricular rate     approximately 115 beats per minute.  LABORATORY DATA:  White count 5.4 and 11.3, glucose 266, creatinine 5172, check home values.  Positive urine drug screen for cocaine.  ASSESSMENT:  A 44 year old [place] American male with past medical history detailed above, presents arrhythmic.  [place] consulted for further evaluation and management regarding ventricular tachycardia.  Nonsustained ventricular  tachycardia (supraventricular tachycardia). Occurred approximately at 0341 a.m. today on October 26, 2010.  The patient had approximately 11-second run of monomorphic ventricular tachycardia at 150 beats per minute.  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  Unfortunately, the patient has used cocaine within the past 10 days and has not been compliant with medications including his antihypertensive medications.  Based on the morphology of the ventricular tachycardia, ischemia is less likely as a cause of this ventricular tachycardia.  Furthermore, the patient is hesitant to undergo hemodialysis at this time and with his renal dysfunction, cardiac catheterization is not an option currently.  RECOMMENDATIONS:  Allow the patient to have a sufficient washout from cocaine.  Ideally, he would benefit from being on approximately 6-9 months duration of optimal medical management for heart failure.  During that course and time, the patient will be followed up closely as an outpatient and reassessed in 6-9 months to see whether his EF has improved.  Should the patient have continued runs of nonsustained ventricular tachycardia after his cocaine has washed out of his system and prior to discharge, we need to discuss the role of a __________ test in the interim versus the clinical benefits and risks regarding ICD implantation given his likely noncompliance that will likely be evident when he leaves the hospital.  We will have the primary team follow up on echocardiogram.  Otherwise, no acute concerns at this point in time.     Curly Shores, MD   ______________________________ Curly Shores, MD    SS/MEDQ  D:  10/26/2010  T:  10/26/2010  Job:  [id number]  Electronically Signed by Curly Shores MD on 02/27/2011 05:47:53 PM

## 2011-03-01 LAB — POCT I-STAT, CHEM 8
Calcium, Ion: 0.99 mmol/L — ABNORMAL LOW (ref 1.12–1.32)
HCT: 53 % — ABNORMAL HIGH (ref 39.0–52.0)
TCO2: 25 mmol/L (ref 0–100)

## 2011-03-04 LAB — COMPREHENSIVE METABOLIC PANEL
AST: 25 U/L (ref 0–37)
Albumin: 3.8 g/dL (ref 3.5–5.2)
Chloride: 100 mEq/L (ref 96–112)
Creatinine, Ser: 3.03 mg/dL — ABNORMAL HIGH (ref 0.4–1.5)
GFR calc Af Amer: 28 mL/min — ABNORMAL LOW (ref >60)
Potassium: 3.7 mEq/L (ref 3.5–5.1)
Total Bilirubin: 0.9 mg/dL (ref 0.3–1.2)

## 2011-03-04 LAB — DIFFERENTIAL
Basophils Absolute: 0.1 10*3/uL (ref 0.0–0.1)
Eosinophils Relative: 1 % (ref 0–5)
Lymphocytes Relative: 22 % (ref 12–46)
Lymphs Abs: 2.1 10*3/uL (ref 0.7–4.0)
Monocytes Absolute: 0.6 10*3/uL (ref 0.1–1.0)
Monocytes Relative: 6 % (ref 3–12)

## 2011-03-04 LAB — CBC
MCV: 93.1 fL (ref 78.0–100.0)
Platelets: 253 10*3/uL (ref 150–400)
WBC: 9.8 10*3/uL (ref 4.0–10.5)

## 2011-03-10 ENCOUNTER — Inpatient Hospital Stay (HOSPITAL_COMMUNITY)
Admission: EM | Admit: 2011-03-10 | Discharge: 2011-03-12 | DRG: 683 | Disposition: A | Discharge status: Home / Self Care | Payer: Self-pay | Attending: Internal Medicine | Admitting: Internal Medicine

## 2011-03-10 ENCOUNTER — Emergency Department (HOSPITAL_COMMUNITY): Payer: Self-pay

## 2011-03-10 DIAGNOSIS — R0602 Shortness of breath: Secondary | ICD-10-CM

## 2011-03-10 DIAGNOSIS — F17200 Nicotine dependence, unspecified, uncomplicated: Secondary | ICD-10-CM | POA: Diagnosis present

## 2011-03-10 DIAGNOSIS — Z91199 Patient's noncompliance with other medical treatment and regimen due to unspecified reason: Secondary | ICD-10-CM

## 2011-03-10 DIAGNOSIS — E876 Hypokalemia: Secondary | ICD-10-CM | POA: Diagnosis present

## 2011-03-10 DIAGNOSIS — I517 Cardiomegaly: Secondary | ICD-10-CM | POA: Diagnosis present

## 2011-03-10 DIAGNOSIS — N289 Disorder of kidney and ureter, unspecified: Secondary | ICD-10-CM

## 2011-03-10 DIAGNOSIS — I509 Heart failure, unspecified: Secondary | ICD-10-CM | POA: Diagnosis present

## 2011-03-10 DIAGNOSIS — F1410 Cocaine abuse, uncomplicated: Secondary | ICD-10-CM | POA: Diagnosis present

## 2011-03-10 DIAGNOSIS — Z7982 Long term (current) use of aspirin: Secondary | ICD-10-CM

## 2011-03-10 DIAGNOSIS — I5042 Chronic combined systolic (congestive) and diastolic (congestive) heart failure: Secondary | ICD-10-CM | POA: Diagnosis present

## 2011-03-10 DIAGNOSIS — F1210 Cannabis abuse, uncomplicated: Secondary | ICD-10-CM | POA: Diagnosis present

## 2011-03-10 DIAGNOSIS — R079 Chest pain, unspecified: Secondary | ICD-10-CM | POA: Diagnosis present

## 2011-03-10 DIAGNOSIS — Z9119 Patient's noncompliance with other medical treatment and regimen: Secondary | ICD-10-CM

## 2011-03-10 DIAGNOSIS — I129 Hypertensive chronic kidney disease with stage 1 through stage 4 chronic kidney disease, or unspecified chronic kidney disease: Principal | ICD-10-CM | POA: Diagnosis present

## 2011-03-10 DIAGNOSIS — N184 Chronic kidney disease, stage 4 (severe): Secondary | ICD-10-CM | POA: Diagnosis present

## 2011-03-10 DIAGNOSIS — F1010 Alcohol abuse, uncomplicated: Secondary | ICD-10-CM | POA: Diagnosis present

## 2011-03-10 DIAGNOSIS — R82998 Other abnormal findings in urine: Secondary | ICD-10-CM | POA: Diagnosis present

## 2011-03-10 LAB — CK TOTAL AND CKMB (NOT AT ARMC): Relative Index: INVALID (ref 0.0–2.5)

## 2011-03-10 LAB — RAPID URINE DRUG SCREEN, HOSP PERFORMED
Amphetamines: NOT DETECTED
Barbiturates: NOT DETECTED
Benzodiazepines: NOT DETECTED
Opiates: NOT DETECTED

## 2011-03-10 LAB — URINE MICROSCOPIC-ADD ON

## 2011-03-10 LAB — HEMOGLOBIN A1C
Hgb A1c MFr Bld: 5.7 % — ABNORMAL HIGH (ref <5.7)
Mean Plasma Glucose: 117 mg/dL — ABNORMAL HIGH (ref <117)

## 2011-03-10 LAB — CBC
HCT: 35.2 % — ABNORMAL LOW (ref 39.0–52.0)
Hemoglobin: 12.2 g/dL — ABNORMAL LOW (ref 13.0–17.0)
MCH: 29.8 pg (ref 26.0–34.0)
MCHC: 34.7 g/dL (ref 30.0–36.0)

## 2011-03-10 LAB — URINALYSIS, ROUTINE W REFLEX MICROSCOPIC
Bilirubin Urine: NEGATIVE
Glucose, UA: NEGATIVE mg/dL
Specific Gravity, Urine: 1.02 (ref 1.005–1.030)
Urobilinogen, UA: 0.2 mg/dL (ref 0.0–1.0)

## 2011-03-10 LAB — DIFFERENTIAL
Lymphocytes Relative: 29 % (ref 12–46)
Lymphs Abs: 2.2 10*3/uL (ref 0.7–4.0)
Monocytes Absolute: 0.5 10*3/uL (ref 0.1–1.0)
Monocytes Relative: 7 % (ref 3–12)
Neutro Abs: 4.5 10*3/uL (ref 1.7–7.7)

## 2011-03-10 LAB — POCT I-STAT, CHEM 8
BUN: 43 mg/dL — ABNORMAL HIGH (ref 6–23)
Chloride: 108 mEq/L (ref 96–112)
Creatinine, Ser: 6.2 mg/dL — ABNORMAL HIGH (ref 0.4–1.5)
Potassium: 3.4 mEq/L — ABNORMAL LOW (ref 3.5–5.1)
Sodium: 141 mEq/L (ref 135–145)

## 2011-03-10 LAB — CARDIAC PANEL(CRET KIN+CKTOT+MB+TROPI)
CK, MB: 6.1 ng/mL (ref 0.3–4.0)
Troponin I: 0.34 ng/mL — ABNORMAL HIGH (ref 0.00–0.06)

## 2011-03-10 LAB — HIV ANTIBODY (ROUTINE TESTING W REFLEX): HIV: NONREACTIVE

## 2011-03-10 LAB — TROPONIN I: Troponin I: 0.4 ng/mL — ABNORMAL HIGH (ref 0.00–0.06)

## 2011-03-10 LAB — POCT CARDIAC MARKERS: Troponin i, poc: 0.11 ng/mL — ABNORMAL HIGH (ref 0.00–0.09)

## 2011-03-10 LAB — TSH: TSH: 3.453 u[IU]/mL (ref 0.350–4.500)

## 2011-03-11 DIAGNOSIS — R0602 Shortness of breath: Secondary | ICD-10-CM

## 2011-03-11 DIAGNOSIS — I129 Hypertensive chronic kidney disease with stage 1 through stage 4 chronic kidney disease, or unspecified chronic kidney disease: Secondary | ICD-10-CM

## 2011-03-11 DIAGNOSIS — R079 Chest pain, unspecified: Secondary | ICD-10-CM

## 2011-03-11 DIAGNOSIS — N189 Chronic kidney disease, unspecified: Secondary | ICD-10-CM

## 2011-03-11 LAB — LIPID PANEL
Cholesterol: 91 mg/dL (ref 0–200)
HDL: 36 mg/dL — ABNORMAL LOW (ref >39)

## 2011-03-11 LAB — COMPREHENSIVE METABOLIC PANEL
ALT: 15 U/L (ref 0–53)
AST: 18 U/L (ref 0–37)
Alkaline Phosphatase: 55 U/L (ref 39–117)
CO2: 20 mEq/L (ref 19–32)
Chloride: 110 mEq/L (ref 96–112)
GFR calc Af Amer: 15 mL/min — ABNORMAL LOW (ref >60)
GFR calc non Af Amer: 12 mL/min — ABNORMAL LOW (ref >60)
Glucose, Bld: 88 mg/dL (ref 70–99)
Potassium: 3 mEq/L — ABNORMAL LOW (ref 3.5–5.1)
Sodium: 141 mEq/L (ref 135–145)

## 2011-03-11 LAB — CBC
HCT: 30.7 % — ABNORMAL LOW (ref 39.0–52.0)
Hemoglobin: 10.5 g/dL — ABNORMAL LOW (ref 13.0–17.0)
MCH: 29.4 pg (ref 26.0–34.0)
MCHC: 34.2 g/dL (ref 30.0–36.0)
MCV: 86 fL (ref 78.0–100.0)
RDW: 15 % (ref 11.5–15.5)

## 2011-03-11 LAB — GLUCOSE, CAPILLARY: Glucose-Capillary: 121 mg/dL — ABNORMAL HIGH (ref 70–99)

## 2011-03-12 DIAGNOSIS — R079 Chest pain, unspecified: Secondary | ICD-10-CM

## 2011-03-12 DIAGNOSIS — N189 Chronic kidney disease, unspecified: Secondary | ICD-10-CM

## 2011-03-12 DIAGNOSIS — I129 Hypertensive chronic kidney disease with stage 1 through stage 4 chronic kidney disease, or unspecified chronic kidney disease: Secondary | ICD-10-CM

## 2011-03-12 DIAGNOSIS — R0602 Shortness of breath: Secondary | ICD-10-CM

## 2011-03-12 LAB — BASIC METABOLIC PANEL
CO2: 17 mEq/L — ABNORMAL LOW (ref 19–32)
Calcium: 8.4 mg/dL (ref 8.4–10.5)
Chloride: 112 mEq/L (ref 96–112)
Creatinine, Ser: 5.03 mg/dL — ABNORMAL HIGH (ref 0.4–1.5)
GFR calc Af Amer: 15 mL/min — ABNORMAL LOW (ref >60)
Sodium: 140 mEq/L (ref 135–145)

## 2011-03-13 ENCOUNTER — Encounter: Payer: Self-pay | Admitting: Ophthalmology

## 2011-03-13 NOTE — Progress Notes (Signed)
  Subjective:    Patient ID: [name] [name], male    DOB: 1966-06-05, 45 y.o.   MRN: [id number]  HPI For complete hospital discharge summary and follow up, please see Echart.   For medications at time of discharge, see updated medications.   Review of Systems     Objective:   Physical Exam        Assessment & Plan:

## 2011-03-17 LAB — ALDOSTERONE: Aldosterone, Serum: 2 ng/dL

## 2011-03-17 NOTE — Discharge Summary (Signed)
[name] [name], [name] [name]              ACCOUNT NO.:  [id number]  MEDICAL RECORD NO.:  [id number]           PATIENT TYPE:  I  LOCATION:  2039                         FACILITY:  [facility]  PHYSICIAN:  Oval Linsey, MD     DATE OF BIRTH:  [birth year]  DATE OF ADMISSION:  03/10/2011 DATE OF DISCHARGE:  03/12/2011                              DISCHARGE SUMMARY   DISCHARGE DIAGNOSES: 1. Hypertensive urgency. 2. Hypertension. 3. Chronic kidney disease, stage IV. 4. Systolic congestive heart failure with an ejection fraction of 30-     35%. 5. Medical nonadherence. 6. Polysubstance abuse notable for cocaine, marijuana, tobacco and     alcohol.  DISCHARGE MEDICATIONS: 1. Hydralazine 50 mg taken orally three times daily. 2. Imdur XR 60 mg taken orally once daily. 3. Norvasc 10 mg taken orally once daily. 4. Coreg 12.5 mg taken orally twice daily. 5. Aspirin 325 mg taken orally once daily.  DISPOSITION AND FOLLOWUP:  [name] [name] was discharged from the [facility] in stable and in improved condition.  He will have followup appointments with Dr. Jola Schmidt of the [facility] on March 25, 2011.  He will also have an orange card eligibility appointment on April 04, 2011 and follow up with Dr. [name] with [facility] on May 27, 2011.  At these follow-up appointments, he is to make sure that he has taking his current antihypertensives.  Financial assistance and social work should also be provided during these appointments.  CONSULTATIONS:  None.  PROCEDURES: 1. A chest x-ray done on March 10, 2011, showed stable cardiomegaly,      no pulmonary edema. 2. EKG on March 10, 2011, showed normal sinus rhythm with occasional PVCs. 3. EKG on March 11, 2011, which showed normal sinus rhythm.  ADMISSION HISTORY AND PHYSICAL:  [name] [name] is a 45 year old man with a past medical history significant for hypertension that is poorly controlled, chronic kidney  disease with a baseline creatinine of around 5, noncompliance, systolic and diastolic congestive heart failure with an EF of 30-35% who presented with shortness of breath that had progressed over 1 week.  He reports not taking his medications for 1 month,  having ran out.  He does not have a primary care physician.  He reports  using cocaine weekly.  He continues to smoke.  He denied chest pain, but  did endorse some abdominal pain, which was diffuse.  He states that he  felt like he needed to catch his breath.  He denied any urinary or bowel disturbances, fever, nausea, vomiting, palpitations, syncope, vision  changes or headache.  VITAL SIGNS ON ADMISSION:  Temperature 97.6, blood pressure 227/170, pulse  105, respirations 22, O2 sat 96% on room air.  PHYSICAL EXAMINATION:  GENERAL:  Mild distress, sleeping on two pillows. EYES:  Pupils equal, round, and reactive to light. NECK:  Supple.  Full range of motion. RESPIRATORY:  Mildly labored.  Good bilateral air entry.  No wheezing, occasional crackles heard at the right posterior base. CARDIOVASCULAR:  Positive S1 and S2 with S3 present. ABDOMEN:  Soft, nontender. EXTREMITIES:  No significant edema. NEUROLOGIC:  Alert and oriented.  Cranial nerves II through XII were  intact grossly.  ADMISSION LABS:  Sodium 141, potassium 3.4, chloride 108, bicarb 20,  BUN 43, creatinine 6.2, glucose 113, magnesium 2.3.  White blood cell  count 7.5, hemoglobin 12.2, hematocrit 35.2, platelets 338, MCV 86.1. Cardiac markers included a troponin 0.11, MB 4.9, myoglobin 268. Urinalysis was significant for protein greater than 300, otherwise was negative for leukocyte esterase and nitrites.  UDS was positive for cocaine and cannabis.  HOSPITAL COURSE: 1. Hypertensive urgency.  [name] [name] presented to the [facility]     Emergency Department with complaints of increasing shortness of     breath over the past week along with some abdominal and chest      pain.  The chest pain had remitted before presentation to the     emergency department.  He was found to have a blood pressure of     227/170 and was initially managed with a nitroprusside drip in the     emergency department.  Once his blood pressure dropped to     178/100, he was transferred to a telemetry floor and was restarted     on his home antihypertensives, which consisted of clonidine,     hydralazine, isosorbide dinitrate, Norvasc, Lasix, and he was also     started on IV hydralazine, which was on an as-needed basis.  Over     the course of his hospitalization, his blood pressure trended down     into the 140s/90s.  His clonidine was discontinued secondary to     possible rebound hypertension.  He was started on Coreg,     which is a beta-blocker and this was titrated upwards for optimal     blood pressure control.  On discharge, he was on Coreg, Norvasc, Imdur     and hydralazine.  Furthermore causes of secondary hypertension were     explored, such as hyperaldosteronism by checking a plasma renin     activity as well as aldosterone level.  These two lab values were     pending upon discharge and will be followed up by Dr. Jola Schmidt     at the [facility] at his follow-up appointment.     His hypertensive urgency was likely secondary to medication     nonadherence as well as ongoing alcohol and cocaine abuse.  2. Chronic kidney disease, stage IV.  [name] [name] presented with a     creatinine of 6.2 in the emergency department, which trended back     down to his baseline of 5 throughout his hospitalization.  He was     counseled on the likelihood of dialysis in the future and the need     to adhere to his medication regimen.  On a prior admission, Renal     and Vascular Surgery were consulted in preparation for dialysis and     AV fistula, which the patient continues to decline at this time.  3. Elevated troponin.  This was likely secondary to chronic kidney       disease and hypertensive urgency as he did not have anymore chest      pain and there were no new EKG findings to suggest acute coronary      syndrome.  His troponin and CK-MB did begin to trend down once his      blood pressure was adequately controlled.  4. Systolic and diastolic congestive heart failure.  [name] [name] was  continued on his home regimen of antihypertensives with the     addition of a beta-blocker.  His last echo was done in November     2011 which showed an ejection fraction of 30-35%.  He is due for     another echo in May 2012 per Cardiology recommendations.  His     current regimen for heart failure includes hydralazine and Imdur.     Of note, he cannot be on an ACE inhibitor given his renal     dysfunction.  5. Polysubstance abuse.  [name] [name] was placed on an alcohol and     cocaine withdrawal protocol and Ativan as needed.  He was counseled     on the detriments to his health from cocaine and alcohol and advice     to discontinue their use.  He was in agreement and understood the      ramifications of continued substance abuse.  6. Medical noncompliance.  [name] [name] stated that he was unable to get     his medications due to cost.  We have reviewed the price of all of     his discharge medications which fall under the 4-dollar     formulary at the [facility] [place].  He reports     that he is able to afford them including the over-the-counter     aspirin.  7. Deep venous thrombosis prophylaxis.  He was started on     Lovenox for deep venous thrombosis prophylaxis.  DISCHARGE VITAL SIGNS:  Temperature 98.5, blood pressure 163/107,  pulses 75, respirations 16, O2 sat 100% on room air.  DISCHARGE LABS:  Sodium 140, potassium 3.6, chloride 112, bicarb 17, BUN 34, creatinine 5.03, glucose 112, calcium 8.4.   ______________________________ Jolene Provost, MD   ______________________________ Oval Linsey, MD   AS/MEDQ  D:   03/12/2011  T:  03/13/2011  Job:  [id number]  cc:   Jola Schmidt, MD  Electronically Signed by Jolene Provost  on 03/13/2011 10:40:49 AM Electronically Signed by Oval Linsey  on 03/17/2011 11:10:45 AM

## 2011-03-23 LAB — RENAL FUNCTION PANEL
BUN: 27 mg/dL — ABNORMAL HIGH (ref 6–23)
Calcium: 8.7 mg/dL (ref 8.4–10.5)
Creatinine, Ser: 3.86 mg/dL — ABNORMAL HIGH (ref 0.4–1.5)
Glucose, Bld: 81 mg/dL (ref 70–99)
Phosphorus: 3.8 mg/dL (ref 2.3–4.6)

## 2011-03-23 LAB — COMPREHENSIVE METABOLIC PANEL
ALT: 13 U/L (ref 0–53)
Albumin: 4.1 g/dL (ref 3.5–5.2)
Alkaline Phosphatase: 78 U/L (ref 39–117)
BUN: 31 mg/dL — ABNORMAL HIGH (ref 6–23)
BUN: 33 mg/dL — ABNORMAL HIGH (ref 6–23)
CO2: 34 mEq/L — ABNORMAL HIGH (ref 19–32)
Calcium: 9.2 mg/dL (ref 8.4–10.5)
Chloride: 92 mEq/L — ABNORMAL LOW (ref 96–112)
Chloride: 95 mEq/L — ABNORMAL LOW (ref 96–112)
Creatinine, Ser: 4.81 mg/dL — ABNORMAL HIGH (ref 0.4–1.5)
GFR calc non Af Amer: 13 mL/min — ABNORMAL LOW (ref >60)
Glucose, Bld: 117 mg/dL — ABNORMAL HIGH (ref 70–99)
Glucose, Bld: 165 mg/dL — ABNORMAL HIGH (ref 70–99)
Potassium: 3.1 mEq/L — ABNORMAL LOW (ref 3.5–5.1)
Sodium: 140 mEq/L (ref 135–145)
Total Bilirubin: 0.6 mg/dL (ref 0.3–1.2)
Total Bilirubin: 1 mg/dL (ref 0.3–1.2)
Total Protein: 7.9 g/dL (ref 6.0–8.3)

## 2011-03-23 LAB — BASIC METABOLIC PANEL
BUN: 24 mg/dL — ABNORMAL HIGH (ref 6–23)
CO2: 29 mEq/L (ref 19–32)
CO2: 33 mEq/L — ABNORMAL HIGH (ref 19–32)
Calcium: 8.5 mg/dL (ref 8.4–10.5)
Calcium: 9.3 mg/dL (ref 8.4–10.5)
Creatinine, Ser: 4.25 mg/dL — ABNORMAL HIGH (ref 0.4–1.5)
Glucose, Bld: 82 mg/dL (ref 70–99)
Glucose, Bld: 98 mg/dL (ref 70–99)
Sodium: 137 mEq/L (ref 135–145)

## 2011-03-23 LAB — DIFFERENTIAL
Basophils Absolute: 0 10*3/uL (ref 0.0–0.1)
Eosinophils Relative: 0 % (ref 0–5)
Lymphocytes Relative: 15 % (ref 12–46)
Lymphs Abs: 1.1 10*3/uL (ref 0.7–4.0)
Neutro Abs: 6.2 10*3/uL (ref 1.7–7.7)
Neutrophils Relative %: 81 % — ABNORMAL HIGH (ref 43–77)

## 2011-03-23 LAB — OSMOLALITY, URINE: Osmolality, Ur: 312 mOsm/kg — ABNORMAL LOW (ref 390–1090)

## 2011-03-23 LAB — URINALYSIS, MICROSCOPIC ONLY
Glucose, UA: NEGATIVE mg/dL
Hgb urine dipstick: NEGATIVE
Ketones, ur: NEGATIVE mg/dL
Leukocytes, UA: NEGATIVE
Protein, ur: 100 mg/dL — AB

## 2011-03-23 LAB — PROTEIN, URINE, 24 HOUR
Collection Interval-UPROT: 24 hours
Urine Total Volume-UPROT: 3050 mL

## 2011-03-23 LAB — CBC
HCT: 39 % (ref 39.0–52.0)
Hemoglobin: 10.8 g/dL — ABNORMAL LOW (ref 13.0–17.0)
Hemoglobin: 12.9 g/dL — ABNORMAL LOW (ref 13.0–17.0)
MCHC: 33.7 g/dL (ref 30.0–36.0)
MCHC: 33.9 g/dL (ref 30.0–36.0)
MCHC: 34.3 g/dL (ref 30.0–36.0)
MCV: 93.1 fL (ref 78.0–100.0)
Platelets: 121 10*3/uL — ABNORMAL LOW (ref 150–400)
RBC: 4.19 MIL/uL — ABNORMAL LOW (ref 4.22–5.81)
RDW: 14.2 % (ref 11.5–15.5)
RDW: 14.3 % (ref 11.5–15.5)
WBC: 7.7 10*3/uL (ref 4.0–10.5)

## 2011-03-23 LAB — PTH, INTACT AND CALCIUM: PTH: 213.7 pg/mL — ABNORMAL HIGH (ref 14.0–72.0)

## 2011-03-23 LAB — POCT CARDIAC MARKERS: Myoglobin, poc: 238 ng/mL (ref 12–200)

## 2011-03-23 LAB — IRON AND TIBC
Iron: 41 ug/dL — ABNORMAL LOW (ref 42–135)
TIBC: 253 ug/dL (ref 215–435)

## 2011-03-23 LAB — POCT I-STAT, CHEM 8
Chloride: 112 mEq/L (ref 96–112)
Creatinine, Ser: 2.9 mg/dL — ABNORMAL HIGH (ref 0.4–1.5)
Glucose, Bld: 107 mg/dL — ABNORMAL HIGH (ref 70–99)
Hemoglobin: 11.9 g/dL — ABNORMAL LOW (ref 13.0–17.0)
Potassium: 4.1 mEq/L (ref 3.5–5.1)
Sodium: 139 mEq/L (ref 135–145)

## 2011-03-23 LAB — CREATININE, URINE, RANDOM: Creatinine, Urine: 137.5 mg/dL

## 2011-03-23 LAB — CHLORIDE, URINE, RANDOM: Chloride Urine: 20 mEq/L

## 2011-03-23 LAB — CREATININE, URINE, 24 HOUR
Collection Interval-UCRE24: 24 hours
Creatinine, Urine: 49.7 mg/dL

## 2011-03-23 LAB — NA AND K (SODIUM & POTASSIUM), RAND UR: Sodium, Ur: 16 mEq/L

## 2011-03-25 ENCOUNTER — Encounter: Payer: Self-pay | Admitting: Ophthalmology

## 2011-03-25 ENCOUNTER — Ambulatory Visit (INDEPENDENT_AMBULATORY_CARE_PROVIDER_SITE_OTHER): Payer: Self-pay | Admitting: Ophthalmology

## 2011-03-25 VITALS — BP 223/157 | HR 100 | Temp 96.3°F | Ht 69.0 in | Wt 159.9 lb

## 2011-03-25 DIAGNOSIS — I10 Essential (primary) hypertension: Secondary | ICD-10-CM

## 2011-03-25 MED ORDER — AMLODIPINE BESYLATE 10 MG PO TABS: 10.0000 mg | ORAL_TABLET | Freq: Every day | ORAL | Status: DC

## 2011-03-25 MED ORDER — CLONIDINE HCL 0.1 MG PO TABS: 0.1000 mg | ORAL_TABLET | Freq: Two times a day (BID) | ORAL | Status: DC

## 2011-03-25 MED ORDER — CARVEDILOL 12.5 MG PO TABS: 12.5000 mg | ORAL_TABLET | Freq: Two times a day (BID) | ORAL | Status: DC

## 2011-03-25 MED ORDER — ISOSORBIDE MONONITRATE ER 60 MG PO TB24: 60.0000 mg | ORAL_TABLET | Freq: Every day | ORAL | Status: DC

## 2011-03-25 MED ORDER — HYDRALAZINE HCL 50 MG PO TABS: 50.0000 mg | ORAL_TABLET | Freq: Three times a day (TID) | ORAL | Status: DC

## 2011-03-25 NOTE — Assessment & Plan Note (Addendum)
The patient's blood pressure was quite elevated today, ast 220/148.  This is because he has not been taking any of his blood pressure medications. The patient was given one dose of clonidine in the office with a plan to take a repeat dose tonight. The patient was instructed to immediately go to the [facility] tomorrow to pick up all of his medications. The patient was instructed to call the clinic if he has any trouble filling his medications. According to the health service pharmacy, hydralazine, Corag, and Norvasc should all be $4 per month, and Imdur ER. 60 mg is available at [facility] for $4 per month. The patient has been made aware of this. I will plan to see the patient again next Monday to followup on his blood pressure.  Repeat BP after receiving clonidine +20 mins was 198/138.  Given that the patients BP was improving and the chronic nature of his hypertension, The patient was sent home with instructions to go to the ED if he develops any changes in his vision, headache, or other symptoms.

## 2011-03-25 NOTE — Progress Notes (Signed)
Clonidine 0.1mg  PO given to pt @4 :22PM per Dr. Valetta Close.

## 2011-03-25 NOTE — Patient Instructions (Signed)
It is extremely important that you go get your medications filled tomorrow. If you have any trouble getting  medications please call our clinic at [phone]. You have a followup appointment with me next Monday at 3:15.

## 2011-03-25 NOTE — Progress Notes (Signed)
  Subjective:    Patient ID: [name] [name], male    DOB: 1966-11-08, 45 y.o.   MRN: [id number]  Hypertension Pertinent negatives include no chest pain, palpitations or shortness of breath.    This is a 45 year old male with a past medical history significant for hypertension, CKD stage IV, who presented for hospital followup for hypertensive urgency. The patient was discharged on hydralazine, Imdur, Norvasc, and Corag his blood pressure was stable during his hospitalization. The patient was made aware of the fact that he should fill his prescriptions at the [facility] pharmacy for $4 each, but was confused and went to [facility] and was unable to afford his medications. Because of this, the patient did not have any of his medications filled. The patient's blood pressure was severely elevated at 220/148 today, the patient completely asymptomatic without chest pain, palpitations, changes in his vision, darkening of his urine, or headache.  Review of Systems  Constitutional: Negative for fever and chills.  Respiratory: Negative for cough and shortness of breath.   Cardiovascular: Negative for chest pain and palpitations.  Gastrointestinal: Negative for vomiting, diarrhea and constipation.       Objective:   Physical Exam  Constitutional: He appears well-developed and well-nourished.  HENT:  Head: Normocephalic and atraumatic.  Eyes: Pupils are equal, round, and reactive to light.  Cardiovascular: Normal rate, regular rhythm and intact distal pulses.  Exam reveals no gallop and no friction rub.   No murmur heard. Pulmonary/Chest: Effort normal and breath sounds normal. He has no wheezes. He has no rales.  Abdominal: Soft. Bowel sounds are normal. He exhibits no distension. There is no tenderness.  Musculoskeletal: Normal range of motion.  Neurological: He is alert. No cranial nerve deficit.  Skin: No rash noted.        Current Outpatient Prescriptions on File Prior to Visit  Medication  Sig Dispense Refill  . aspirin 325 MG tablet Take 325 mg by mouth daily.        Marland Kitchen DISCONTD: amLODipine (NORVASC) 10 MG tablet Take 10 mg by mouth daily.        Marland Kitchen DISCONTD: carvedilol (COREG) 12.5 MG tablet Take 12.5 mg by mouth 2 (two) times daily with a meal.        . DISCONTD: hydrALAZINE (APRESOLINE) 50 MG tablet Take 50 mg by mouth 3 (three) times daily.        Marland Kitchen DISCONTD: isosorbide mononitrate (IMDUR) 60 MG 24 hr tablet Take 60 mg by mouth daily.          Past Medical History  Diagnosis Date  . Hypertensive urgency   . Hypertension   . CKD (chronic kidney disease) stage 4, GFR 15-29 ml/min   . Systolic CHF     Ef [id number]  . Medical non-compliance   . Polysubstance abuse      Assessment & Plan:

## 2011-04-01 ENCOUNTER — Telehealth: Payer: Self-pay | Admitting: Ophthalmology

## 2011-04-01 ENCOUNTER — Encounter: Payer: Self-pay | Admitting: Ophthalmology

## 2011-04-01 NOTE — Progress Notes (Signed)
This encounter was created in error - please disregard.

## 2011-04-01 NOTE — Telephone Encounter (Signed)
I attempted to contact the patient regarding his missed appointment this afternoon, but the patient's phone line has been disconnected. Because of the severity of his hypertension at his last visit, I contacted the [facility] pharmacy to determine whether or not the patient had picked up his medications. Fortunately, the patient has picked up all 4 antihypertensives and did so on April 11. The patient was well-controlled on this regimen in the hospital. At this point, if the patient recontacts the clinic to schedule appointment, he should be seen one more time to reach check his blood pressure. Otherwise he has been instructed that he should keep his regularly scheduled appointment that was made for him with [facility].

## 2011-04-29 NOTE — Consult Note (Signed)
NAMENOEL, THORMAHLEN              ACCOUNT NO.:  [id number]   MEDICAL RECORD NO.:  [id number]          PATIENT TYPE:  INP   LOCATION:  6703                         FACILITY:  [facility]   PHYSICIAN:  Maudie Flakes. Hassell Done, M.D.   DATE OF BIRTH:  11/11/1966   DATE OF CONSULTATION:  06/21/2009  DATE OF DISCHARGE:                                 CONSULTATION   Mr. Ocheltree is a 45 year old black man admitted with headache, weakness,  nausea, and blood pressure of 155/122 in the ER.  He had BUN/CR of  31/4.8 on admission.  Despite IV fluids, BUN and creatinine have  remained elevated.  Renal consult was requested by Dr. Lydia Guiles.   PAST MEDICAL HISTORY:  Hypertension 10-15 years.  He takes no meds.  He  was healthy as a child according to his mother.   MEDICATIONS PRIOR TO ADMISSION:  None.  No prescription drugs.  He took  [facility] powders and ibuprofen over the last week or two for headache.   ALLERGIES:  None known.   SOCIAL HISTORY:  He was born in [place].  He graduated in [employer].  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  No cigarettes. A  case of beer per week.  He smokes and snorts  cocaine, last time about 1 week ago.  No IV or subcutaneous drugs.  He  lives with a girlfriend.  He knows several people who were on dialysis  but could not remember their names.   FAMILY HISTORY:  Mother age 60, has diabetes and hypertension.  Father  age 55, has diabetes and hypertension.  He has 2 sisters, one of them  has hypertension.  He has one brother who has hypertension.  He has 2  children, a daughter age 12 and a son age 87, both healthy.  Neither of  them live with him.   REVIEW OF SYSTEMS:  No edema.  No oral ulcers.  No alopecia.  No  arthritis.  No rash.  No angina.  No claudication.  No melena or  hematochezia.  No gross hematuria.  No renal colic.  No DOE.  No  orthopnea.  No cold or heat intolerance.   CURRENT MEDICATIONS:  1. Amlodipine 10/D.  2. Clonidine  0.1 t.i.d. plus p.r.n. Clonidine 0.2 mg.  3. Metoprolol 12.5 b.i.d.  4. Protonix 40/D plus p.r.n.   PHYSICAL EXAMINATION:  GENERAL:  He is awake, alert, and cooperative.  VITAL SIGNS:  Temperature 97.5, pulse 60, respirations 20, blood  pressure 160/104.  HEENT:  Eyes:  Pupils equal, round, and reactive to light.  Fundi show  sharp discs, AV nicking, arterial narrowing.  Exudates are present.    No hemorrhages.  Nose, mouth, pharynx:  No oral ulcers.  Teeth in fair shape.  NECK:  Thyroid not enlarged.  No bruits.  CHEST:  Clear.  HEART:  No rub.  ABDOMEN:  Slight tenderness in the right upper quadrant.  Liver edge  felt 4 cm below the right costal margin.  GENITOURINARY:  Circumcised penis.  Testes descended bilaterally.  No  masses.  EXTREMITIES:  No rash.  No edema.  No arthritis.  NEUROLOGIC:  Right handed.  No flap.  Strength equal.  Sensation intact.   LABORATORY DATA:  Hemoglobin 13.1, WBC 7700, PLTs 120.  Sodium 135,  potassium 3.4, chloride 92, CO2 34, BUN 33, CR 4.81, calcium 9.2,  albumin 3.7.  Urine protein 185.  Urine creatinine 137.5 (protein  creatinine ratio 1.35).  Urinalysis not done.   Renal ultrasound:  Right kidney 9.3 cm, left kidney 9.8 cm.  No  hydronephrosis noted.  No increase in echogenicity.   IMPRESSION:  1. Hypertensive urgency (better now).  2. Cocaine use.  3. Chronic kidney disease, likely secondary to hypertensive urgency      and cocaine use.  4. Alcohol use.  5. Headache.  6. Right upper quadrant pain.   RECOMMENDATIONS:  1. Continue current meds to slowly decrease blood pressure, avoid ACE      inhibitor for now.  2. Cocaine use (he is not using at the current time--needs to STOP).  3. Check HIV, check urinalysis.  Depending on what urinalysis shows      then decide whether further serologic testing is indicated; 24-hour      urine for protein and creatinine.  Save left arm for vascular      access.  4. Watch for DTs.  5. Avoid  NSAIDs.  6. If pain continues, check RUQ ultrasound.      Richard F. Hassell Done, M.D.  Electronically Signed     RFF/MEDQ  D:  06/21/2009  T:  06/22/2009  Job:  [id number]

## 2011-04-29 NOTE — H&P (Signed)
[name] [name], [name] [name]              ACCOUNT NO.:  [id number]   MEDICAL RECORD NO.:  [id number]          PATIENT TYPE:  INP   LOCATION:  6703                         FACILITY:  [facility]   PHYSICIAN:  Noel Christmas, MD    DATE OF BIRTH:  1966/02/23   DATE OF ADMISSION:  06/20/2009  DATE OF DISCHARGE:                              HISTORY & PHYSICAL   The patient seen in the emergency room.   PRIMARY CARE PHYSICIAN:  The patient is unassigned and has no primary  care physician.   CHIEF COMPLAINT:  Generalized weakness, nausea, vomiting, some abdominal  pain.   HISTORY OF PRESENTING ILLNESS:  Mr. Mayhew is a 84-year African  American male with a past medical history significant for only  hypertension for which he has not been taking medications.  According to  him in the past, 1 week, he has been having generalized weakness,  nausea, and vomiting.  He admits to being a heavy drinker and has not  been able to enjoy his beer in the last few days again because of his  generalized weakness.  It got worse to the point that he had to come to  the hospital today to find out what was going on.  Note that he did have  some epigastric pain as well, but his vomitus did not contain any blood  and no melanotic stools or diarrhea.  He denies chest pain and shortness  of breath.  Also, denies dysuria, frequency, and urgency.  He says that  he drinks a lot.  He has not had any withdrawal symptoms when he did not  have any drink.  He denies fever, chills.   PAST MEDICAL HISTORY:  Hypertension for which is not on any medication.   MEDICATIONS:  He is not on any medication at this time.   SOCIAL HISTORY:  He says he lives with his girlfriend.  Admits to  smoking about a pack a day.  Also, admits to drinking a lot of beer,  could not specify just used what a lot and admits to using marijuana,  but denies cocaine and heroin use.   FAMILY HISTORY:  Significant for diabetes with the mother and  hypertension with the father and no heart disease.   ALLERGIES:  No known drug allergies.   REVIEW OF SYSTEMS:  A 12-point review of system was done and was  negative except noted in the history of present illness.  Of note is the  fact that on presentation in the emergency room, the patient came in  with a blood pressure of 155/122 and at one point, it went as high as  230/170.  They had to place the patient on nicardipine drip, which  eventually brought down his blood pressure to 126/94, which is the last  reading in the computer.   PHYSICAL EXAMINATION:  GENERAL:  The patient seen and examined in the  emergency room.  He is alert and oriented x3 in no acute cardiopulmonary  distress.  Complains of mild epigastric discomfort.  HEENT: Normocephalic and atraumatic.  Pupils equal, round, and reactive  to light.  Extraocular muscles intact.  Nares patent.  Neck:  Supple.  No JVD, no lymphadenopathy, no thyromegaly.  CHEST: Clear to auscultation bilaterally.  No rhonchi, no rales, no  wheezing.  ABDOMEN:  Soft, nontender.  No hepatosplenomegaly.  EXTREMITIES:  No clubbing, no cyanosis, no edema.  CARDIOVASCULAR SYSTEM:  First and second heart sounds only.  No third  heart sounds.  No murmurs, no gallops, no rubs.  CENTRAL NERVOUS SYSTEM:  No obvious focal deficits.  Cranial nerves  intact II through XII.  The patient has normal reflexes.  Power is 5/5  globally.   LABORATORY DATA/INVESTIGATIONS:  Sodium 140, potassium 3.1, chloride 95,  bicarb 33, glucose 117, BUN 31, and creatinine 4.80.  CK-MB 5.2,  troponin 0.16, and a myoglobin 238.  CT scan of the head was done and  was read as having no acute intracranial findings.  Chest x-ray was done  and was read as having no acute findings as well.   ASSESSMENT:  1. Accelerated hypertension, now better control.  2. Acute renal failure, possibly prerenal, but chronic components      cannot be disregarded at this point.  3. Nausea and  vomiting, better control.  4. Hypokalemia.  5. Likely contraction alkalosis.  6. Alcohol abuse, watch out for withdrawal.  7. Tobacco abuse.  8. Medical noncompliance.   PLAN OF CARE:  The patient will be admitted and IV fluid will be  initiated.  We are going to hydrate him aggressively and what out for  his renal indices in the morning.  There is a strong suspicion at this  time that this could just be proven of from the patient vomiting for  some time now, but then again there might still be a chronic component  considering the patient has not been on his blood pressure medications  for some time now.  Because of this, we will go ahead and order a renal  ultrasound and a urinary indices.  Depending on how this goes in the  next day also, we will have to consult the Renal Service at that point.  Blood pressure contrary is of highest importance in this setting, and we  are going to start him on both Norvasc and clonidine.  We will also have  him on a p.r.n. hydralazine in case his blood pressure again goes out of  control.  In the meantime, we will replete his potassium.  Once his  urinary indices are back, we will be able to calculate the patient's  FENA.  We will give him antiemetics for his nausea and give him some GI  and DVT prophylaxis.  Further plan of care will be according to hospital  course      Noel Christmas, MD  Electronically Signed     GU/MEDQ  D:  06/20/2009  T:  06/21/2009  Job:  [id number]

## 2011-04-29 NOTE — Discharge Summary (Signed)
NAMELEANTHONY, HOOD              ACCOUNT NO.:  [id number]   MEDICAL RECORD NO.:  [id number]          PATIENT TYPE:  INP   LOCATION:  6703                         FACILITY:  [facility]   PHYSICIAN:  Neysa Bonito, MD  DATE OF BIRTH:  02-Dec-1966   DATE OF ADMISSION:  06/20/2009  DATE OF DISCHARGE:  06/24/2009                               DISCHARGE SUMMARY   PRIMARY CARE PHYSICIAN:  Unassigned.   BRIEF HISTORY OF PRESENT ILLNESS:  Mr. Twing is a pleasant African  American male presented with generalized weakness, nausea and vomiting.  The patient was found to have renal failure and accelerated  hypertension.   HOSPITAL PROCEDURE:  1. During the hospital course, the patient had chest x-ray on June 20, 2009.  Impression was showing no acute finding.  2. CT head without contrast on June 20, 2009.  Impression:  No acute      intracranial findings.  3. Renal sonogram on June 21, 2009.  Impression:  Normal study.  Normal      size and parenchymal echogenicity.  No evidence of mass or      hydronephrosis.  Left renal length is 9.8 cm.   His sodium today is 134, potassium 3.7, chloride 97, CO2 29, glucose is  82, and BUN is 31.  White blood count is 4.1, hemoglobin is 10.8,  hematocrit 31.4, and platelet count 137.   HOSPITAL CONSULTATION:  Nephrology, Maudie Flakes. Hassell Done, [place]:  1. The patient was found to have renal failure, which is felt      secondary acute, hence the patient does not have previous      documented creatinine.  The patient was started on IV fluids and      his renal sonogram was obtained as above.  There is no improvement      of his kidney function and the reason for kidney function is likely      chronic with stage IV.  This is felt secondary to hypertensive      nephrosclerosis, although no renal biopsy was done.  Nevertheless,      the patient is very stable to be released today to follow up with      [facility].  As per the  Nephrology note, the Renal      Service will call him to set up an appointment within 2-4 weeks.  2. Hypertension with medical noncompliance.  The patient will be      discharged on clonidine 0.3 mg p.o. b.i.d. and Lopressor 50 mg      b.i.d.  3. I discussed with the patient the findings and the plan of care.  He      is aware and agreeable to discharge plan.   DISCHARGE DIAGNOSES:  1. 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  2. Hypertension with hypertensive urgency on admission, stable.  3. Hypokalemia stable.   DISCHARGE MEDICATIONS:  1. Norvasc 10 mg p.o. daily.  2. Catapres 0.3 mg p.o. b.i.d.  3. Lopressor 50 mg  p.o. b.i.d., prescription written.   DISCHARGE/PLAN:  Follow up with [facility] as discussed  with the patient.  He is aware and agreeable to discharge plan.      Neysa Bonito, MD  Electronically Signed     EME/MEDQ  D:  06/24/2009  T:  06/25/2009  Job:  [id number]

## 2012-07-15 ENCOUNTER — Inpatient Hospital Stay (HOSPITAL_COMMUNITY)
Admission: EM | Admit: 2012-07-15 | Discharge: 2012-07-22 | DRG: 673 | Disposition: A | Discharge status: Home / Self Care | Payer: Medicaid Other | Attending: Internal Medicine | Admitting: Internal Medicine

## 2012-07-15 ENCOUNTER — Inpatient Hospital Stay (HOSPITAL_COMMUNITY): Payer: Medicaid Other

## 2012-07-15 ENCOUNTER — Emergency Department (HOSPITAL_COMMUNITY): Payer: Medicaid Other

## 2012-07-15 ENCOUNTER — Inpatient Hospital Stay (HOSPITAL_COMMUNITY): Payer: Medicaid Other | Admitting: Certified Registered Nurse Anesthetist

## 2012-07-15 ENCOUNTER — Encounter (HOSPITAL_COMMUNITY): Payer: Self-pay

## 2012-07-15 ENCOUNTER — Encounter (HOSPITAL_COMMUNITY)
Admission: EM | Disposition: A | Discharge status: Home / Self Care | Payer: Self-pay | Source: Home / Self Care | Attending: Internal Medicine

## 2012-07-15 ENCOUNTER — Encounter (HOSPITAL_COMMUNITY): Payer: Self-pay | Admitting: Certified Registered Nurse Anesthetist

## 2012-07-15 DIAGNOSIS — I509 Heart failure, unspecified: Secondary | ICD-10-CM | POA: Diagnosis present

## 2012-07-15 DIAGNOSIS — F17200 Nicotine dependence, unspecified, uncomplicated: Secondary | ICD-10-CM

## 2012-07-15 DIAGNOSIS — I5022 Chronic systolic (congestive) heart failure: Secondary | ICD-10-CM | POA: Diagnosis present

## 2012-07-15 DIAGNOSIS — Z9119 Patient's noncompliance with other medical treatment and regimen: Secondary | ICD-10-CM

## 2012-07-15 DIAGNOSIS — R7989 Other specified abnormal findings of blood chemistry: Secondary | ICD-10-CM

## 2012-07-15 DIAGNOSIS — N179 Acute kidney failure, unspecified: Secondary | ICD-10-CM | POA: Diagnosis present

## 2012-07-15 DIAGNOSIS — D62 Acute posthemorrhagic anemia: Secondary | ICD-10-CM

## 2012-07-15 DIAGNOSIS — F17210 Nicotine dependence, cigarettes, uncomplicated: Secondary | ICD-10-CM | POA: Diagnosis present

## 2012-07-15 DIAGNOSIS — N19 Unspecified kidney failure: Secondary | ICD-10-CM

## 2012-07-15 DIAGNOSIS — E872 Acidosis, unspecified: Secondary | ICD-10-CM

## 2012-07-15 DIAGNOSIS — D631 Anemia in chronic kidney disease: Secondary | ICD-10-CM | POA: Diagnosis present

## 2012-07-15 DIAGNOSIS — I119 Hypertensive heart disease without heart failure: Secondary | ICD-10-CM

## 2012-07-15 DIAGNOSIS — R040 Epistaxis: Secondary | ICD-10-CM

## 2012-07-15 DIAGNOSIS — F1210 Cannabis abuse, uncomplicated: Secondary | ICD-10-CM | POA: Diagnosis present

## 2012-07-15 DIAGNOSIS — I10 Essential (primary) hypertension: Secondary | ICD-10-CM

## 2012-07-15 DIAGNOSIS — F1010 Alcohol abuse, uncomplicated: Secondary | ICD-10-CM

## 2012-07-15 DIAGNOSIS — I120 Hypertensive chronic kidney disease with stage 5 chronic kidney disease or end stage renal disease: Principal | ICD-10-CM | POA: Diagnosis present

## 2012-07-15 DIAGNOSIS — N189 Chronic kidney disease, unspecified: Secondary | ICD-10-CM

## 2012-07-15 DIAGNOSIS — E876 Hypokalemia: Secondary | ICD-10-CM | POA: Diagnosis not present

## 2012-07-15 DIAGNOSIS — F1410 Cocaine abuse, uncomplicated: Secondary | ICD-10-CM

## 2012-07-15 DIAGNOSIS — N184 Chronic kidney disease, stage 4 (severe): Secondary | ICD-10-CM

## 2012-07-15 DIAGNOSIS — Z91199 Patient's noncompliance with other medical treatment and regimen due to unspecified reason: Secondary | ICD-10-CM

## 2012-07-15 DIAGNOSIS — N186 End stage renal disease: Secondary | ICD-10-CM | POA: Diagnosis present

## 2012-07-15 DIAGNOSIS — N2581 Secondary hyperparathyroidism of renal origin: Secondary | ICD-10-CM | POA: Diagnosis present

## 2012-07-15 DIAGNOSIS — N039 Chronic nephritic syndrome with unspecified morphologic changes: Secondary | ICD-10-CM | POA: Diagnosis present

## 2012-07-15 DIAGNOSIS — R748 Abnormal levels of other serum enzymes: Secondary | ICD-10-CM | POA: Diagnosis present

## 2012-07-15 HISTORY — DX: Acute posthemorrhagic anemia: D62

## 2012-07-15 HISTORY — DX: Hypocalcemia: E83.51

## 2012-07-15 LAB — PROTIME-INR
INR: 1.35 (ref 0.00–1.49)
Prothrombin Time: 16.9 seconds — ABNORMAL HIGH (ref 11.6–15.2)

## 2012-07-15 LAB — CBC
MCV: 86 fL (ref 78.0–100.0)
Platelets: 335 10*3/uL (ref 150–400)
RBC: 2.86 MIL/uL — ABNORMAL LOW (ref 4.22–5.81)
WBC: 11 10*3/uL — ABNORMAL HIGH (ref 4.0–10.5)

## 2012-07-15 LAB — PRO B NATRIURETIC PEPTIDE: Pro B Natriuretic peptide (BNP): 31955 pg/mL — ABNORMAL HIGH (ref 0–125)

## 2012-07-15 LAB — CBC WITH DIFFERENTIAL/PLATELET
Basophils Relative: 1 % (ref 0–1)
Eosinophils Absolute: 0.5 10*3/uL (ref 0.0–0.7)
MCH: 28 pg (ref 26.0–34.0)
MCHC: 33 g/dL (ref 30.0–36.0)
Neutrophils Relative %: 64 % (ref 43–77)
Platelets: 295 10*3/uL (ref 150–400)
RBC: 3.28 MIL/uL — ABNORMAL LOW (ref 4.22–5.81)
RDW: 14.4 % (ref 11.5–15.5)

## 2012-07-15 LAB — URINALYSIS, ROUTINE W REFLEX MICROSCOPIC
Bilirubin Urine: NEGATIVE
Nitrite: NEGATIVE
Protein, ur: NEGATIVE mg/dL
Specific Gravity, Urine: 1.015 (ref 1.005–1.030)
Urobilinogen, UA: 0.2 mg/dL (ref 0.0–1.0)

## 2012-07-15 LAB — POCT I-STAT 3, VENOUS BLOOD GAS (G3P V)
Bicarbonate: 15.7 mEq/L — ABNORMAL LOW (ref 20.0–24.0)
pH, Ven: 7.285 (ref 7.250–7.300)
pO2, Ven: 32 mmHg (ref 30.0–45.0)

## 2012-07-15 LAB — COMPREHENSIVE METABOLIC PANEL
ALT: 17 U/L (ref 0–53)
Albumin: 2.3 g/dL — ABNORMAL LOW (ref 3.5–5.2)
Alkaline Phosphatase: 53 U/L (ref 39–117)
Potassium: 3.9 mEq/L (ref 3.5–5.1)
Sodium: 135 mEq/L (ref 135–145)
Total Protein: 5.2 g/dL — ABNORMAL LOW (ref 6.0–8.3)

## 2012-07-15 LAB — CARDIAC PANEL(CRET KIN+CKTOT+MB+TROPI)
CK, MB: 10.1 ng/mL (ref 0.3–4.0)
Total CK: 569 U/L — ABNORMAL HIGH (ref 7–232)
Troponin I: <0.3 ng/mL (ref <0.30)

## 2012-07-15 LAB — APTT: aPTT: 32 seconds (ref 24–37)

## 2012-07-15 LAB — POCT I-STAT TROPONIN I

## 2012-07-15 LAB — RETICULOCYTES: Retic Ct Pct: 2.6 % (ref 0.4–3.1)

## 2012-07-15 SURGERY — CONTROL OF EPISTAXIS, ENDOSCOPIC
Anesthesia: General | Site: Nose | Laterality: Bilateral | Wound class: Clean Contaminated

## 2012-07-15 MED ORDER — ACETAMINOPHEN 650 MG RE SUPP: 650.0000 mg | Freq: Four times a day (QID) | RECTAL | Status: DC | PRN

## 2012-07-15 MED ORDER — OXYMETAZOLINE HCL 0.05 % NA SOLN
NASAL | Status: AC
  Filled 2012-07-15: qty 15

## 2012-07-15 MED ORDER — LORAZEPAM 1 MG PO TABS
1.0000 mg | ORAL_TABLET | Freq: Four times a day (QID) | ORAL | Status: AC | PRN
  Administered 2012-07-15 – 2012-07-17 (×2): 1 mg via ORAL
  Filled 2012-07-15 (×3): qty 1

## 2012-07-15 MED ORDER — ONDANSETRON HCL 4 MG/2ML IJ SOLN
INTRAMUSCULAR | Status: AC
  Filled 2012-07-15: qty 2

## 2012-07-15 MED ORDER — MUPIROCIN CALCIUM 2 % EX CREA
TOPICAL_CREAM | CUTANEOUS | Status: AC
  Filled 2012-07-15: qty 15

## 2012-07-15 MED ORDER — OXYMETAZOLINE HCL 0.05 % NA SOLN
1.0000 | Freq: Once | NASAL | Status: AC
  Administered 2012-07-15: 1 via NASAL
  Filled 2012-07-15: qty 15

## 2012-07-15 MED ORDER — LIDOCAINE-EPINEPHRINE 1 %-1:100000 IJ SOLN
INTRAMUSCULAR | Status: AC
  Filled 2012-07-15: qty 1

## 2012-07-15 MED ORDER — LORAZEPAM 1 MG PO TABS
0.0000 mg | ORAL_TABLET | Freq: Four times a day (QID) | ORAL | Status: AC
  Administered 2012-07-15 – 2012-07-16 (×3): 1 mg via ORAL
  Filled 2012-07-15: qty 1
  Filled 2012-07-15: qty 2
  Filled 2012-07-15: qty 1

## 2012-07-15 MED ORDER — ONDANSETRON HCL 4 MG/2ML IJ SOLN
INTRAMUSCULAR | Status: DC | PRN
  Administered 2012-07-15 (×2): 4 mg via INTRAVENOUS

## 2012-07-15 MED ORDER — 0.9 % SODIUM CHLORIDE (POUR BTL) OPTIME
TOPICAL | Status: DC | PRN
  Administered 2012-07-15: 1000 mL

## 2012-07-15 MED ORDER — PHENYLEPHRINE HCL 10 MG/ML IJ SOLN
INTRAMUSCULAR | Status: DC | PRN
  Administered 2012-07-15: 40 ug via INTRAVENOUS
  Administered 2012-07-15 (×2): 80 ug via INTRAVENOUS

## 2012-07-15 MED ORDER — EPHEDRINE SULFATE 50 MG/ML IJ SOLN
INTRAMUSCULAR | Status: DC | PRN
  Administered 2012-07-15 (×3): 10 mg via INTRAVENOUS
  Administered 2012-07-15: 5 mg via INTRAVENOUS
  Administered 2012-07-15: 10 mg via INTRAVENOUS

## 2012-07-15 MED ORDER — SUCCINYLCHOLINE CHLORIDE 20 MG/ML IJ SOLN
INTRAMUSCULAR | Status: DC | PRN
  Administered 2012-07-15: 110 mg via INTRAVENOUS

## 2012-07-15 MED ORDER — PANTOPRAZOLE SODIUM 40 MG IV SOLR
40.0000 mg | INTRAVENOUS | Status: DC
  Administered 2012-07-15 – 2012-07-17 (×3): 40 mg via INTRAVENOUS
  Filled 2012-07-15 (×4): qty 40

## 2012-07-15 MED ORDER — ONDANSETRON HCL 4 MG/2ML IJ SOLN
4.0000 mg | Freq: Once | INTRAMUSCULAR | Status: AC
  Administered 2012-07-15: 4 mg via INTRAVENOUS

## 2012-07-15 MED ORDER — FUROSEMIDE 10 MG/ML IJ SOLN
40.0000 mg | Freq: Two times a day (BID) | INTRAMUSCULAR | Status: DC
  Administered 2012-07-15 – 2012-07-16 (×3): 40 mg via INTRAVENOUS
  Filled 2012-07-15 (×6): qty 4

## 2012-07-15 MED ORDER — HYDRALAZINE HCL 20 MG/ML IJ SOLN
10.0000 mg | Freq: Four times a day (QID) | INTRAMUSCULAR | Status: DC | PRN
  Administered 2012-07-16 – 2012-07-18 (×6): 10 mg via INTRAVENOUS
  Filled 2012-07-15 (×6): qty 0.5

## 2012-07-15 MED ORDER — MORPHINE SULFATE 2 MG/ML IJ SOLN
2.0000 mg | INTRAMUSCULAR | Status: DC | PRN
  Administered 2012-07-17 – 2012-07-21 (×6): 2 mg via INTRAVENOUS
  Filled 2012-07-15 (×6): qty 1

## 2012-07-15 MED ORDER — MORPHINE SULFATE 4 MG/ML IJ SOLN
4.0000 mg | Freq: Once | INTRAMUSCULAR | Status: AC
  Administered 2012-07-15: 4 mg via INTRAVENOUS
  Filled 2012-07-15: qty 1

## 2012-07-15 MED ORDER — LORAZEPAM 2 MG/ML IJ SOLN: 1.0000 mg | Freq: Four times a day (QID) | INTRAMUSCULAR | Status: AC | PRN

## 2012-07-15 MED ORDER — METOCLOPRAMIDE HCL 5 MG/ML IJ SOLN
INTRAMUSCULAR | Status: DC | PRN
  Administered 2012-07-15: 10 mg via INTRAVENOUS

## 2012-07-15 MED ORDER — ADULT MULTIVITAMIN W/MINERALS CH
1.0000 | ORAL_TABLET | Freq: Every day | ORAL | Status: DC
  Administered 2012-07-15 – 2012-07-22 (×8): 1 via ORAL
  Filled 2012-07-15 (×8): qty 1

## 2012-07-15 MED ORDER — ACETAMINOPHEN 325 MG PO TABS
650.0000 mg | ORAL_TABLET | Freq: Four times a day (QID) | ORAL | Status: DC | PRN
  Administered 2012-07-17 – 2012-07-19 (×3): 650 mg via ORAL
  Filled 2012-07-15 (×2): qty 2

## 2012-07-15 MED ORDER — SODIUM CHLORIDE 0.9 % IV SOLN
1.0000 g | Freq: Once | INTRAVENOUS | Status: AC
  Administered 2012-07-15: 1 g via INTRAVENOUS
  Filled 2012-07-15 (×2): qty 10

## 2012-07-15 MED ORDER — DEXAMETHASONE SODIUM PHOSPHATE 4 MG/ML IJ SOLN
INTRAMUSCULAR | Status: DC | PRN
  Administered 2012-07-15: 4 mg via INTRAVENOUS

## 2012-07-15 MED ORDER — ISOSORBIDE MONONITRATE ER 60 MG PO TB24
60.0000 mg | ORAL_TABLET | Freq: Every day | ORAL | Status: DC
  Administered 2012-07-15 – 2012-07-22 (×8): 60 mg via ORAL
  Filled 2012-07-15 (×8): qty 1

## 2012-07-15 MED ORDER — MUPIROCIN CALCIUM 2 % EX CREA
TOPICAL_CREAM | CUTANEOUS | Status: DC | PRN
  Administered 2012-07-15: 1 via TOPICAL

## 2012-07-15 MED ORDER — OXYMETAZOLINE HCL 0.05 % NA SOLN
NASAL | Status: DC | PRN
  Administered 2012-07-15: 1 via NASAL

## 2012-07-15 MED ORDER — SODIUM CHLORIDE 0.9 % IJ SOLN
3.0000 mL | Freq: Two times a day (BID) | INTRAMUSCULAR | Status: DC
  Administered 2012-07-15 – 2012-07-21 (×11): 3 mL via INTRAVENOUS

## 2012-07-15 MED ORDER — CARVEDILOL 12.5 MG PO TABS
12.5000 mg | ORAL_TABLET | Freq: Two times a day (BID) | ORAL | Status: DC
  Administered 2012-07-16 – 2012-07-18 (×5): 12.5 mg via ORAL
  Filled 2012-07-15 (×9): qty 1

## 2012-07-15 MED ORDER — ONDANSETRON HCL 4 MG PO TABS: 4.0000 mg | ORAL_TABLET | Freq: Four times a day (QID) | ORAL | Status: DC | PRN

## 2012-07-15 MED ORDER — SODIUM CHLORIDE 0.9 % IV SOLN
INTRAVENOUS | Status: DC | PRN
  Administered 2012-07-15: 15:00:00 via INTRAVENOUS

## 2012-07-15 MED ORDER — ONDANSETRON HCL 4 MG/2ML IJ SOLN: 4.0000 mg | Freq: Once | INTRAMUSCULAR | Status: DC | PRN

## 2012-07-15 MED ORDER — PHENYLEPHRINE HCL 10 MG/ML IJ SOLN
10.0000 mg | INTRAVENOUS | Status: DC | PRN
  Administered 2012-07-15: 50 ug/min via INTRAVENOUS

## 2012-07-15 MED ORDER — ONDANSETRON HCL 4 MG/2ML IJ SOLN
4.0000 mg | Freq: Four times a day (QID) | INTRAMUSCULAR | Status: DC | PRN
  Administered 2012-07-18: 4 mg via INTRAVENOUS
  Filled 2012-07-15: qty 2

## 2012-07-15 MED ORDER — MIDAZOLAM HCL 5 MG/5ML IJ SOLN
INTRAMUSCULAR | Status: DC | PRN
  Administered 2012-07-15: 2 mg via INTRAVENOUS

## 2012-07-15 MED ORDER — OXYMETAZOLINE HCL 0.05 % NA SOLN
2.0000 | Freq: Two times a day (BID) | NASAL | Status: AC
  Administered 2012-07-16 – 2012-07-18 (×4): 2 via NASAL
  Filled 2012-07-15 (×4): qty 15

## 2012-07-15 MED ORDER — THIAMINE HCL 100 MG/ML IJ SOLN
100.0000 mg | Freq: Every day | INTRAMUSCULAR | Status: DC
  Filled 2012-07-15 (×8): qty 1

## 2012-07-15 MED ORDER — METOPROLOL TARTRATE 1 MG/ML IV SOLN
10.0000 mg | Freq: Once | INTRAVENOUS | Status: AC
  Administered 2012-07-15: 10 mg via INTRAVENOUS
  Filled 2012-07-15: qty 10

## 2012-07-15 MED ORDER — HYDROMORPHONE HCL PF 1 MG/ML IJ SOLN: 0.2500 mg | INTRAMUSCULAR | Status: DC | PRN

## 2012-07-15 MED ORDER — LIDOCAINE-EPINEPHRINE 1 %-1:100000 IJ SOLN
INTRAMUSCULAR | Status: DC | PRN
  Administered 2012-07-15: 20 mL

## 2012-07-15 MED ORDER — LORAZEPAM 1 MG PO TABS
0.0000 mg | ORAL_TABLET | Freq: Two times a day (BID) | ORAL | Status: AC
  Administered 2012-07-19 (×2): 1 mg via ORAL
  Filled 2012-07-15 (×2): qty 1

## 2012-07-15 MED ORDER — VITAMIN B-1 100 MG PO TABS
100.0000 mg | ORAL_TABLET | Freq: Every day | ORAL | Status: DC
  Administered 2012-07-15 – 2012-07-22 (×8): 100 mg via ORAL
  Filled 2012-07-15 (×8): qty 1

## 2012-07-15 MED ORDER — SENNOSIDES-DOCUSATE SODIUM 8.6-50 MG PO TABS
1.0000 | ORAL_TABLET | Freq: Every evening | ORAL | Status: DC | PRN
  Administered 2012-07-15 – 2012-07-17 (×2): 1 via ORAL
  Filled 2012-07-15 (×2): qty 1

## 2012-07-15 MED ORDER — LIDOCAINE HCL (CARDIAC) 20 MG/ML IV SOLN
INTRAVENOUS | Status: DC | PRN
  Administered 2012-07-15: 40 mg via INTRAVENOUS

## 2012-07-15 MED ORDER — PROPOFOL 10 MG/ML IV EMUL
INTRAVENOUS | Status: DC | PRN
  Administered 2012-07-15: 120 mg via INTRAVENOUS

## 2012-07-15 MED ORDER — CALCIUM CARBONATE 1250 (500 CA) MG PO TABS
1000.0000 mg | ORAL_TABLET | Freq: Three times a day (TID) | ORAL | Status: DC
  Administered 2012-07-15 – 2012-07-16 (×2): 1000 mg via ORAL
  Filled 2012-07-15 (×4): qty 2

## 2012-07-15 MED ORDER — FENTANYL CITRATE 0.05 MG/ML IJ SOLN
INTRAMUSCULAR | Status: DC | PRN
  Administered 2012-07-15: 100 ug via INTRAVENOUS

## 2012-07-15 MED ORDER — ALBUTEROL SULFATE (5 MG/ML) 0.5% IN NEBU: 2.5000 mg | INHALATION_SOLUTION | RESPIRATORY_TRACT | Status: DC | PRN

## 2012-07-15 MED ORDER — FOLIC ACID 1 MG PO TABS
1.0000 mg | ORAL_TABLET | Freq: Every day | ORAL | Status: DC
  Administered 2012-07-15 – 2012-07-22 (×8): 1 mg via ORAL
  Filled 2012-07-15 (×8): qty 1

## 2012-07-15 MED ORDER — ACETAMINOPHEN 10 MG/ML IV SOLN: 1000.0000 mg | Freq: Once | INTRAVENOUS | Status: DC | PRN

## 2012-07-15 MED ORDER — DEXTROSE 5 % IV SOLN
1.0000 g | INTRAVENOUS | Status: DC
  Administered 2012-07-15 – 2012-07-17 (×3): 1 g via INTRAVENOUS
  Filled 2012-07-15 (×3): qty 10

## 2012-07-15 MED ORDER — SODIUM CHLORIDE 0.9 % IV SOLN
20.0000 ug | Freq: Once | INTRAVENOUS | Status: AC
  Administered 2012-07-15: 20 ug via INTRAVENOUS
  Filled 2012-07-15: qty 5

## 2012-07-15 SURGICAL SUPPLY — 46 items
ATTRACTOMAT 16X20 MAGNETIC DRP (DRAPES) IMPLANT
BENZOIN TINCTURE PRP APPL 2/3 (GAUZE/BANDAGES/DRESSINGS) ×2 IMPLANT
BLADE RAD40 ROTATE 4M 4 5PK (BLADE) IMPLANT
BLADE RAD60 ROTATE M4 4 5PK (BLADE) IMPLANT
BLADE TRICUT ROTATE M4 4 5PK (BLADE) ×2 IMPLANT
CANISTER SUCTION 2500CC (MISCELLANEOUS) ×2 IMPLANT
CLOTH BEACON ORANGE TIMEOUT ST (SAFETY) ×2 IMPLANT
CLSR STERI-STRIP ANTIMIC 1/2X4 (GAUZE/BANDAGES/DRESSINGS) ×2 IMPLANT
COAGULATOR SUCT SWTCH 10FR 6 (ELECTROSURGICAL) ×2 IMPLANT
CORDS BIPOLAR (ELECTRODE) IMPLANT
CRADLE DONUT ADULT HEAD (MISCELLANEOUS) ×2 IMPLANT
DECANTER SPIKE VIAL GLASS SM (MISCELLANEOUS) IMPLANT
DRESSING MEROCEL 8CM (GAUZE/BANDAGES/DRESSINGS) ×1 IMPLANT
DRESSING NASAL POPE 10X1.5X2.5 (GAUZE/BANDAGES/DRESSINGS) ×1 IMPLANT
DRESSING TELFA 8X3 (GAUZE/BANDAGES/DRESSINGS) IMPLANT
DRSG MEROCEL 8CM (GAUZE/BANDAGES/DRESSINGS) ×2
DRSG NASAL POPE 10X1.5X2.5 (GAUZE/BANDAGES/DRESSINGS) ×2
ELECT REM PT RETURN 9FT ADLT (ELECTROSURGICAL)
ELECTRODE REM PT RTRN 9FT ADLT (ELECTROSURGICAL) IMPLANT
FILTER ARTHROSCOPY CONVERTOR (FILTER) ×2 IMPLANT
FLOSEAL 10ML (HEMOSTASIS) ×2 IMPLANT
GLOVE SURG SS PI 7.5 STRL IVOR (GLOVE) ×2 IMPLANT
GOWN STRL NON-REIN LRG LVL3 (GOWN DISPOSABLE) ×4 IMPLANT
KIT BASIN OR (CUSTOM PROCEDURE TRAY) ×2 IMPLANT
KIT ROOM TURNOVER OR (KITS) ×2 IMPLANT
NEEDLE SPNL 25GX3.5 QUINCKE BL (NEEDLE) ×2 IMPLANT
NS IRRIG 1000ML POUR BTL (IV SOLUTION) ×2 IMPLANT
PAD ARMBOARD 7.5X6 YLW CONV (MISCELLANEOUS) ×4 IMPLANT
PATTIES SURGICAL .5 X3 (DISPOSABLE) ×2 IMPLANT
PENCIL FOOT CONTROL (ELECTRODE) ×2 IMPLANT
SHEATH ENDOSCRUB 0 DEG (SHEATH) IMPLANT
SHEATH ENDOSCRUB 30 DEG (SHEATH) IMPLANT
SHEATH ENDOSCRUB 45 DEG (SHEATH) IMPLANT
SOLUTION ANTI FOG 6CC (MISCELLANEOUS) ×2 IMPLANT
SPECIMEN JAR SMALL (MISCELLANEOUS) IMPLANT
STRIP CLOSURE SKIN 1/2X4 (GAUZE/BANDAGES/DRESSINGS) ×2 IMPLANT
SUT ETHILON 3 0 PS 1 (SUTURE) IMPLANT
SWAB COLLECTION DEVICE MRSA (MISCELLANEOUS) IMPLANT
SYR 50ML SLIP (SYRINGE) IMPLANT
SYRINGE 10CC LL (SYRINGE) ×2 IMPLANT
TOWEL OR 17X24 6PK STRL BLUE (TOWEL DISPOSABLE) ×2 IMPLANT
TOWEL OR 17X26 10 PK STRL BLUE (TOWEL DISPOSABLE) ×2 IMPLANT
TRAY ENT MC OR (CUSTOM PROCEDURE TRAY) ×2 IMPLANT
TUBE CONNECTING 12X1/4 (SUCTIONS) ×2 IMPLANT
WATER STERILE IRR 1000ML POUR (IV SOLUTION) IMPLANT
WIPE INSTRUMENT VISIWIPE 73X73 (MISCELLANEOUS) ×2 IMPLANT

## 2012-07-15 NOTE — Anesthesia Procedure Notes (Signed)
Procedure Name: Intubation Date/Time: 07/15/2012 2:54 PM Performed by: Carola Frost Pre-anesthesia Checklist: Patient identified, Timeout performed, Emergency Drugs available, Suction available and Patient being monitored Patient Re-evaluated:Patient Re-evaluated prior to inductionOxygen Delivery Method: Circle system utilized Preoxygenation: Pre-oxygenation with 100% oxygen Intubation Type: IV induction, Rapid sequence and Cricoid Pressure applied Laryngoscope Size: Mac and 4 Grade View: Grade I Tube size: 8.0 mm Number of attempts: 1 Airway Equipment and Method: Stylet Placement Confirmation: positive ETCO2,  breath sounds checked- equal and bilateral and ETT inserted through vocal cords under direct vision Secured at: 23 cm Tube secured with: Tape Dental Injury: Teeth and Oropharynx as per pre-operative assessment

## 2012-07-15 NOTE — ED Notes (Signed)
Dr. Darl Householder back at bedside. Consulting with ENT on the phone.

## 2012-07-15 NOTE — Progress Notes (Signed)
Subjective: POD#0 from nasal endoscopy with bilateral control of epistaxis and bilateral sphenopalatine artery cauterization/ligation. Nasal packs in place, nose hemostatic.  Objective: Vital signs in last 24 hours: Temp:  [96.8 F (36 C)-97.9 F (36.6 C)] 96.8 F (36 C) (08/01 1609) Pulse Rate:  [62-84] 65  (08/01 1630) Resp:  [10-19] 13  (08/01 1630) BP: (138-187)/(93-130) 144/110 mmHg (08/01 1630) SpO2:  [99 %-100 %] 99 % (08/01 1630) Weight:  [72.576 kg (160 lb)] 72.576 kg (160 lb) (08/01 0942)  Somnolent but awakens easily and follows commands. Some stertor/snoring when sleping. CN 2-12 intact and symmetric. EOMI, PERRLA BL. Nasal merocele packs in place and secure to malar area with silk ties/steri-strips. Nose hemostatic.  @LABLAST2 (wbc:2,hgb:2,hct:2,plt:2)  Basename 07/15/12 1007  NA 135  K 3.9  CL 100  CO2 13*  GLUCOSE 134*  BUN 107*  CREATININE 12.29*  CALCIUM 4.8*    Medications: Current facility-administered medications:acetaminophen (OFIRMEV) IV 1,000 mg, 1,000 mg, Intravenous, Once PRN, Roberts Gaudy, MD;  calcium gluconate 1 g in sodium chloride 0.9 % 100 mL IVPB, 1 g, Intravenous, Once, Wandra Arthurs, MD, 1 g at 07/15/12 1223;  HYDROmorphone (DILAUDID) injection 0.25-0.5 mg, 0.25-0.5 mg, Intravenous, Q5 min PRN, Roberts Gaudy, MD metoprolol (LOPRESSOR) injection 10 mg, 10 mg, Intravenous, Once, Wandra Arthurs, MD, 10 mg at 07/15/12 1223;  morphine 4 MG/ML injection 4 mg, 4 mg, Intravenous, Once, Wandra Arthurs, MD, 4 mg at 07/15/12 1223;  ondansetron [facility]) injection 4 mg, 4 mg, Intravenous, Once, Wandra Arthurs, MD, 4 mg at 07/15/12 1230;  ondansetron (ZOFRAN) injection 4 mg, 4 mg, Intravenous, Once PRN, Roberts Gaudy, MD oxymetazoline (AFRIN) 0.05 % nasal spray 1 spray, 1 spray, Each Nare, Once, Wandra Arthurs, MD, 1 spray at 07/15/12 1028;  oxymetazoline (AFRIN) 0.05 % nasal spray 2 spray, 2 spray, Each Nare, BID, Ruby Cola, MD;  DISCONTD: 0.9 % irrigation (POUR BTL), , , PRN,  Ruby Cola, MD, 1,000 mL at 07/15/12 1533;  DISCONTD: lidocaine-EPINEPHrine (XYLOCAINE W/EPI) 1 %-1:100000 (with pres) injection, , , PRN, Ruby Cola, MD, 20 mL at 07/15/12 1542 DISCONTD: mupirocin cream (BACTROBAN) 2 %, , , PRN, Ruby Cola, MD, 1 application at [id number] 1545;  DISCONTD: oxymetazoline (AFRIN) 0.05 % nasal spray, , , PRN, Ruby Cola, MD, 1 application at [id number] 1531 Facility-Administered Medications Ordered in Other Encounters: DISCONTD: 0.9 %  sodium chloride infusion, , , Continuous PRN, Carola Frost, CRNA;  DISCONTD: dexamethasone (DECADRON) injection, , , PRN, Carola Frost, CRNA, 4 mg at 07/15/12 1513;  DISCONTD: ePHEDrine injection, , , PRN, Carola Frost, CRNA, 10 mg at 07/15/12 1519;  DISCONTD: fentaNYL (SUBLIMAZE) injection, , , PRN, Noah Charon, CRNA, 100 mcg at 07/15/12 1454 DISCONTD: lidocaine (cardiac) 100 mg/15ml (XYLOCAINE) 20 MG/ML injection 2%, , , PRN, Noah Charon, CRNA, 40 mg at 07/15/12 1454;  DISCONTD: metoCLOPramide (REGLAN) injection, , , PRN, Carola Frost, CRNA, 10 mg at 07/15/12 1513;  DISCONTD: midazolam (VERSED) 5 MG/5ML injection, , , PRN, Noah Charon, CRNA, 2 mg at 07/15/12 1445;  DISCONTD: ondansetron (ZOFRAN) injection, , , PRN, Noah Charon, CRNA, 4 mg at 07/15/12 1513 DISCONTD: phenylephrine (NEO-SYNEPHRINE) 0.04 mg/mL in dextrose 5 % 250 mL infusion, 10 mg, , Continuous PRN, Carola Frost, CRNA, 50 mcg/min at 07/15/12 1517;  DISCONTD: phenylephrine (NEO-SYNEPHRINE) injection, , , PRN, Carola Frost, CRNA, 80 mcg at 07/15/12 1520;  DISCONTD: propofol (DIPRIVAN) 10 mg/ml infusion, , , PRN, Noah Charon, CRNA,  120 mg at 07/15/12 1454 DISCONTD: succinylcholine (ANECTINE) injection, , , PRN, Noah Charon, CRNA, 110 mg at 07/15/12 1454  Assessment/Plan: POD#0 from nasal endoscopy and SPA ligation/cautery. Will be admitted to Medicine service for control of blood pressure and acute on chronic  renal failure. Nasal packs need to stay in place x 7 days. I will remove the packs in the office in 7 days if he is discharged, if an inpatient in 7 days I will remove at bedside. Needs to be on antibiotics while the nasal packs are in place, e.g. Keflex, Bactrim, etc. If on oxygen needs to be humidified.   LOS: 0 days   Ruby Cola 07/15/2012, 4:48 PM

## 2012-07-15 NOTE — ED Notes (Signed)
Troponin elevated, primary advised by phone.  RN  [place].

## 2012-07-15 NOTE — Anesthesia Postprocedure Evaluation (Signed)
  Anesthesia Post-op Note  Patient: [name] [name]  Procedure(s) Performed: Procedure(s) (LRB): NASAL ENDOSCOPY WITH EPISTAXIS CONTROL (Bilateral)  Patient Location: PACU  Anesthesia Type: General  Level of Consciousness: awake, alert  and oriented  Airway and Oxygen Therapy: Patient Spontanous Breathing  Post-op Pain: mild  Post-op Assessment: Post-op Vital signs reviewed  Post-op Vital Signs: Reviewed  Complications: No apparent anesthesia complications

## 2012-07-15 NOTE — H&P (Signed)
[name] [name] is an 46 y.o. male.    PCP: [facility]  Chief Complaint: Bleeding from nose  HPI: This is a 46 year old, African American male, with a past medical history of Hypertension, chronic kidney disease, who is noncompliant, who was in his usual state of health till this morning when he woke up at around 5 or 6:00 and felt that his nose was bleeding. Initially, the bleeding seemed to occur from his left nostril, subsequently, both his nostrils started bleeding. The bleeding would not stop despite pressure and, so, he decided to come in to the hospital. He denies picking his nose or blowing his nose recently. Denies being on any anticoagulants. He's had a few episodes of epistaxis in the past, but hasn't required any surgical intervention. Currently, is complaining of a headache in the front of the head which has been ongoing for 45 minutes without any visual disturbances or any focal weakness.His denies any fever or chills.  Tells me that for the last 2-3 weeks he's noticed that his ankles have been swollen. He hasn't noticed any decrease in the amount of urination. Denies any blood in the urine. Has been short of breath. However, denies any orthopnea. Shortness of breath is mainly with exertion. He mentioned that he's lost about 25 pounds in the last one year. Denies any blood in the stools or black colored stools. He does have known kidney disease, however, does not follow any nephrologist. Review of his chart reveals that he has been offered dialysis multiple times in the past. However, patient has declined. He denies any chest pain at this time. Denies any cough. No sick contacts.    Home Medications: Prior to Admission medications   Medication Sig Start Date End Date Taking? Authorizing Provider  amLODipine (NORVASC) 10 MG tablet Take 1 tablet (10 mg total) by mouth daily. 03/25/11  Yes Leory Plowman Bowen  carvedilol (COREG) 12.5 MG tablet Take 1 tablet (12.5 mg total) by mouth 2 (two)  times daily with a meal. 03/25/11  Yes Jola Schmidt  hydrALAZINE (APRESOLINE) 50 MG tablet Take 1 tablet (50 mg total) by mouth 3 (three) times daily. 03/25/11  Yes Jola Schmidt  isosorbide mononitrate (IMDUR) 60 MG 24 hr tablet Take 1 tablet (60 mg total) by mouth daily. 03/25/11  Yes Jola Schmidt  Multiple Vitamin (MULTIVITAMIN WITH MINERALS) TABS Take 1 tablet by mouth daily.   Yes Historical Provider, MD  Tetrahydrozoline HCl (VISINE OP) Apply 2 drops to eye daily as needed. For dry eyes   Yes Historical Provider, MD    Allergies: No Known Allergies  Past Medical History: Past Medical History  Diagnosis Date  . Hypertensive urgency   . Hypertension   . CKD (chronic kidney disease) stage 4, GFR 15-29 ml/min   . Systolic CHF     Ef [id number]  . Medical non-compliance   . Polysubstance abuse    Denies any surgical history   Social History:  reports that he has been smoking.  He does not have any smokeless tobacco history on file. He reports that he drinks alcohol. He reports that he uses illicit drugs. He's not entirely forthcoming about his alcohol intake.   Family History:  Family History  Problem Relation Age of Onset  . Hypertension Mother   . Hypertension Father     Review of Systems - History obtained from the patient General ROS: positive for  - fatigue Psychological ROS: negative Ophthalmic ROS: negative ENT ROS: positive for - epistaxis Allergy and Immunology  ROS: negative Hematological and Lymphatic ROS: negative Endocrine ROS: negative Respiratory ROS: as in hpi Cardiovascular ROS: as in hpi Gastrointestinal ROS: no abdominal pain, change in bowel habits, or black or bloody stools Genito-Urinary ROS: no dysuria, trouble voiding, or hematuria Musculoskeletal ROS: negative Neurological ROS: no TIA or stroke symptoms Dermatological ROS: negative  Physical Examination Blood pressure 173/121, pulse 68, temperature 97.9 F (36.6 C), temperature source Oral, resp.  rate 10, height 5\' 9"  (1.753 m), weight 72.576 kg (160 lb), SpO2 100.00%.  General appearance: alert, cooperative, appears stated age and no distress Head: Normocephalic, without obvious abnormality, atraumatic Eyes: conjunctivae/corneas clear. PERRL, EOM's intact.  Nose: Nasal packing noted in both nostrils with blood oozing out. Throat: lips, mucosa, and tongue normal; teeth and gums normal Neck: no adenopathy, no carotid bruit, no JVD, supple, symmetrical, trachea midline and thyroid not enlarged, symmetric, no tenderness/mass/nodules Back: symmetric, no curvature. ROM normal. No CVA tenderness. Resp: Decreased air entry at the bases without any crackles. No wheezing is appreciated. Cardio: regular rate and rhythm, S1, S2 normal, no murmur, click, rub or gallop GI: soft, non-tender; bowel sounds normal; no masses,  no organomegaly Extremities: pitting edema 2+ bilateral LE Pulses: 2+ and symmetric Skin: Skin color, texture, turgor normal. 2 open wounds noted in both feet and axillary nodes normal. Neurologic: He is alert and oriented x3. Seems somewhat distracted, and takes a long time to answer questions. Does not have any focal neurologic deficits. Cranial nerves are intact.  Laboratory Data: Results for orders placed during the hospital encounter of 07/15/12 (from the past 48 hour(s))  CBC WITH DIFFERENTIAL     Status: Abnormal   Collection Time   07/15/12 10:07 AM      Component Value Range Comment   WBC 6.4  4.0 - 10.5 K/uL    RBC 3.28 (*) 4.22 - 5.81 MIL/uL    Hemoglobin 9.2 (*) 13.0 - 17.0 g/dL    HCT 27.9 (*) 39.0 - 52.0 %    MCV 85.1  78.0 - 100.0 fL    MCH 28.0  26.0 - 34.0 pg    MCHC 33.0  30.0 - 36.0 g/dL    RDW 14.4  11.5 - 15.5 %    Platelets 295  150 - 400 K/uL    Neutrophils Relative 64  43 - 77 %    Neutro Abs 4.1  1.7 - 7.7 K/uL    Lymphocytes Relative 18  12 - 46 %    Lymphs Abs 1.1  0.7 - 4.0 K/uL    Monocytes Relative 10  3 - 12 %    Monocytes Absolute 0.7   0.1 - 1.0 K/uL    Eosinophils Relative 7 (*) 0 - 5 %    Eosinophils Absolute 0.5  0.0 - 0.7 K/uL    Basophils Relative 1  0 - 1 %    Basophils Absolute 0.1  0.0 - 0.1 K/uL   COMPREHENSIVE METABOLIC PANEL     Status: Abnormal   Collection Time   07/15/12 10:07 AM      Component Value Range Comment   Sodium 135  135 - 145 mEq/L    Potassium 3.9  3.5 - 5.1 mEq/L    Chloride 100  96 - 112 mEq/L    CO2 13 (*) 19 - 32 mEq/L    Glucose, Bld 134 (*) 70 - 99 mg/dL    BUN 107 (*) 6 - 23 mg/dL    Creatinine, Ser 12.29 (*) 0.50 - 1.35  mg/dL    Calcium 4.8 (*) 8.4 - 10.5 mg/dL    Total Protein 5.2 (*) 6.0 - 8.3 g/dL    Albumin 2.3 (*) 3.5 - 5.2 g/dL    AST 20  0 - 37 U/L    ALT 17  0 - 53 U/L    Alkaline Phosphatase 53  39 - 117 U/L    Total Bilirubin 0.1 (*) 0.3 - 1.2 mg/dL    GFR calc non Af Amer 4 (*) >90 mL/min    GFR calc Af Amer 5 (*) >90 mL/min   PRO B NATRIURETIC PEPTIDE     Status: Abnormal   Collection Time   07/15/12 10:08 AM      Component Value Range Comment   Pro B Natriuretic peptide (BNP) 31955.0 (*) 0 - 125 pg/mL   POCT I-STAT TROPONIN I     Status: Abnormal   Collection Time   07/15/12 10:24 AM      Component Value Range Comment   Troponin i, poc 0.17 (*) 0.00 - 0.08 ng/mL    Comment 3            POCT I-STAT 3, BLOOD GAS (G3P V)     Status: Abnormal   Collection Time   07/15/12 11:51 AM      Component Value Range Comment   pH, Ven 7.285  7.250 - 7.300    pCO2, Ven 33.0 (*) 45.0 - 50.0 mmHg    pO2, Ven 32.0  30.0 - 45.0 mmHg    Bicarbonate 15.7 (*) 20.0 - 24.0 mEq/L    TCO2 17  0 - 100 mmol/L    O2 Saturation 56.0      Acid-base deficit 10.0 (*) 0.0 - 2.0 mmol/L    Sample type VENOUS      Comment NOTIFIED PHYSICIAN       Radiology Reports: Dg Chest 2 View  07/15/2012  *RADIOLOGY REPORT*  Clinical Data: Epistaxis  CHEST - 2 VIEW  Comparison: 03/10/2011 10/20/2010  Findings: Stable moderate cardiomegaly.  Thoracic aorta contour is normal.  The lungs are clear.  Negative  for edema, airspace disease, or pleural effusion.  No acute or suspicious bony abnormality.  Visualized upper abdomen is normal.  IMPRESSION: Stable moderate cardiomegaly with mild pulmonary vascular congestion.  The lungs are clear.  Original Report Authenticated By: Curlene Dolphin, M.D.    Electrocardiogram: Pending  Assessment/Plan  Principal Problem:  *Acute on chronic renal failure Active Problems:  ALCOHOL ABUSE  TOBACCO ABUSE  COCAINE ABUSE  CHRONIC KIDNEY DISEASE STAGE IV (SEVERE)  Epistaxis  Anemia due to blood loss, acute  Accelerated hypertension  Elevated troponin  Metabolic acidosis  Hypocalcemia   #1 acute and chronic renal failure with metabolic acidosis and hypocalcemia: This is likely progression of his chronic kidney disease due to non-compliance with anti-hypertensive regimen. He likely has end-stage renal disease. He will likely require hemodialysis during this hospitalization. Nephrology has been consulted. Defer further management of his ARF, hypocalcemia and his metabolic acidosis to them. Phosphorus levels will be checked  #2 epistaxis: He is been seen by ENT and they plan to take him to the OR cauterization. Coagulation panel will be checked.  #3 accelerated hypertension: He'll be placed on his home dose of Coreg and Imdur. Hydralazine intravenously will be utilized as needed. Blood pressure is probably high because of noncompliance.  #4 elevated troponin: Probably a result of his renal failure. He denies any chest pain. EKG will be obtained. Cardiac enzymes will be  cycled. Echocardiogram will be obtained as well.  #5 anemia likely secondary to acute blood loss: CBCs will be followed up. He will be transfused as needed. Anemia panel will be checked.  #6 history of chronic systolic heart failure with a known EF of 30% (based on ECHO from 2011): He does have significant lower extremity edema. However, no evidence for pulmonary edema at this time. History of heart  failure, along with end-stage renal disease is probably contributing to the pedal edema. Follow up on echocardiogram.  #7 history of polysubstance abuse including nicotine, alcohol, as well as cocaine: A urine drug screen will be obtained. He'll be placed on the Ativan withdrawal protocol. Thiamine will be provided.  #8 Headache: Likely related to epistaxis but we will proceed with Ct of head.  Patient is a full code.  DVT, prophylaxis with SCDs.  All of the above was discussed with the patient and, with his permission, with his other family members who were in the room.  Due to his medical acuity he'll be admitted to the step down unit for now.  Further management decisions will depend on results of further testing and patient's response to treatment  [facility] Pager [phone]  07/15/2012, 2:36 PM

## 2012-07-15 NOTE — Anesthesia Postprocedure Evaluation (Signed)
  Anesthesia Post-op Note  Patient: [name] [name]  Procedure(s) Performed: Procedure(s) (LRB): NASAL ENDOSCOPY WITH EPISTAXIS CONTROL (Bilateral)  Patient Location: PACU  Anesthesia Type: General  Level of Consciousness: awake, alert  and oriented  Airway and Oxygen Therapy: Patient Spontanous Breathing  Post-op Pain: none  Post-op Assessment: Post-op Vital signs reviewed and Patient's Cardiovascular Status Stable  Post-op Vital Signs: stable  Complications: No apparent anesthesia complications

## 2012-07-15 NOTE — ED Notes (Signed)
Right nare continues to drain blood around packing as well.

## 2012-07-15 NOTE — ED Notes (Signed)
Family at bedside. 

## 2012-07-15 NOTE — ED Notes (Signed)
Pt returned from xray

## 2012-07-15 NOTE — Progress Notes (Signed)
Report to [place], Therapist, sports. Elmo Putt, NP notified of patient's blood pressure of 189/118. Blood pressure meds ordered

## 2012-07-15 NOTE — Consult Note (Addendum)
Reason for Consult: ARF on CKD 4 versus progressive CKD to ESRD Referring Physician: Bonnielee Haff [name]- [facility]  [name] [name] is an 46 y.o. male.  HPI:  46 year old African American man with a history of CKD 4 (Creatinine of about 5.0 last year) who failed to keep regular appointments with nephrology or primary [name]. Has underlying hypertension complicated by poor compliance and history of polysubstance abuse. Brought in to the ER today with epistaxis that was unresponsive to acute management/rhino rocket  and required emergent surgical intervention by ENT. Also noted to have significantly elevated blood pressures- hypertensive urgency.  I evaluated the patient in the PACU where he was arousable to touch and voice and engaged in part of the conversation however frequently drifted off to sleep-likely residual effects of anesthesia. He denied any chest pain and reported some shortness of breath. Had some itchiness and discomfort in his nose that was recently status post cautery/packing. He was not forthcoming regarding his antihypertensive medication compliance but freely admitted that it was "long" since he last saw Dr. Amil [name].  Labs in ER earlier today were significant for a creatinine that is elevated to 12, Corrected calcium of 6.4, hypoalbuminemia and anemia.     Past Medical History  Diagnosis Date  . Hypertensive urgency   . Hypertension   . CKD (chronic kidney disease) stage 4, GFR 15-29 ml/min   . Systolic CHF     Ef 99991111  . Medical non-compliance   . Polysubstance abuse     History reviewed. No pertinent past surgical history.  Family History  Problem Relation Age of Onset  . Hypertension Mother   . Hypertension Father     Social History:  reports that he has been smoking.  He does not have any smokeless tobacco history on file. He reports that he drinks alcohol. He reports that he uses illicit drugs.  Allergies: No Known Allergies  Medications:    Scheduled:   . calcium gluconate 1 GM IV  1 g Intravenous Once  . metoprolol  10 mg Intravenous Once  .  morphine injection  4 mg Intravenous Once  . ondansetron (ZOFRAN) IV  4 mg Intravenous Once  . oxymetazoline  1 spray Each Nare Once    Results for orders placed during the hospital encounter of 07/15/12 (from the past 48 hour(s))  CBC WITH DIFFERENTIAL     Status: Abnormal   Collection Time   07/15/12 10:07 AM      Component Value Range Comment   WBC 6.4  4.0 - 10.5 K/uL    RBC 3.28 (*) 4.22 - 5.81 MIL/uL    Hemoglobin 9.2 (*) 13.0 - 17.0 g/dL    HCT 27.9 (*) 39.0 - 52.0 %    MCV 85.1  78.0 - 100.0 fL    MCH 28.0  26.0 - 34.0 pg    MCHC 33.0  30.0 - 36.0 g/dL    RDW 14.4  11.5 - 15.5 %    Platelets 295  150 - 400 K/uL    Neutrophils Relative 64  43 - 77 %    Neutro Abs 4.1  1.7 - 7.7 K/uL    Lymphocytes Relative 18  12 - 46 %    Lymphs Abs 1.1  0.7 - 4.0 K/uL    Monocytes Relative 10  3 - 12 %    Monocytes Absolute 0.7  0.1 - 1.0 K/uL    Eosinophils Relative 7 (*) 0 - 5 %  Eosinophils Absolute 0.5  0.0 - 0.7 K/uL    Basophils Relative 1  0 - 1 %    Basophils Absolute 0.1  0.0 - 0.1 K/uL   COMPREHENSIVE METABOLIC PANEL     Status: Abnormal   Collection Time   07/15/12 10:07 AM      Component Value Range Comment   Sodium 135  135 - 145 mEq/L    Potassium 3.9  3.5 - 5.1 mEq/L    Chloride 100  96 - 112 mEq/L    CO2 13 (*) 19 - 32 mEq/L    Glucose, Bld 134 (*) 70 - 99 mg/dL    BUN 107 (*) 6 - 23 mg/dL    Creatinine, Ser 12.29 (*) 0.50 - 1.35 mg/dL    Calcium 4.8 (*) 8.4 - 10.5 mg/dL    Total Protein 5.2 (*) 6.0 - 8.3 g/dL    Albumin 2.3 (*) 3.5 - 5.2 g/dL    AST 20  0 - 37 U/L    ALT 17  0 - 53 U/L    Alkaline Phosphatase 53  39 - 117 U/L    Total Bilirubin 0.1 (*) 0.3 - 1.2 mg/dL    GFR calc non Af Amer 4 (*) >90 mL/min    GFR calc Af Amer 5 (*) >90 mL/min   PRO B NATRIURETIC PEPTIDE     Status: Abnormal   Collection Time   07/15/12 10:08 AM      Component Value  Range Comment   Pro B Natriuretic peptide (BNP) 31955.0 (*) 0 - 125 pg/mL   POCT I-STAT TROPONIN I     Status: Abnormal   Collection Time   07/15/12 10:24 AM      Component Value Range Comment   Troponin i, poc 0.17 (*) 0.00 - 0.08 ng/mL    Comment 3            POCT I-STAT 3, BLOOD GAS (G3P V)     Status: Abnormal   Collection Time   07/15/12 11:51 AM      Component Value Range Comment   pH, Ven 7.285  7.250 - 7.300    pCO2, Ven 33.0 (*) 45.0 - 50.0 mmHg    pO2, Ven 32.0  30.0 - 45.0 mmHg    Bicarbonate 15.7 (*) 20.0 - 24.0 mEq/L    TCO2 17  0 - 100 mmol/L    O2 Saturation 56.0      Acid-base deficit 10.0 (*) 0.0 - 2.0 mmol/L    Sample type VENOUS      Comment NOTIFIED PHYSICIAN       Dg Chest 2 View  07/15/2012  *RADIOLOGY REPORT*  Clinical Data: Epistaxis  CHEST - 2 VIEW  Comparison: 03/10/2011 10/20/2010  Findings: Stable moderate cardiomegaly.  Thoracic aorta contour is normal.  The lungs are clear.  Negative for edema, airspace disease, or pleural effusion.  No acute or suspicious bony abnormality.  Visualized upper abdomen is normal.  IMPRESSION: Stable moderate cardiomegaly with mild pulmonary vascular congestion.  The lungs are clear.  Original Report Authenticated By: Curlene Dolphin, M.D.    Review of Systems  Unable to perform ROS: mental status change  HENT:       Refractory epistaxis as noted in HPI above   Unable to perform the review of systems as the patient is lethargic with intermittent somnolence status post anesthesia.   Blood pressure 173/121, pulse 68, temperature 97.9 F (36.6 C), temperature source Oral, resp. rate 10, height 5'  9" (1.753 m), weight 72.576 kg (160 lb), SpO2 100.00%. Physical Exam  Nursing note and vitals reviewed. Constitutional: He appears well-developed and well-nourished. No distress.       Strong urinary odor is present  HENT:  Head: Normocephalic.  Mouth/Throat: No oropharyngeal exudate.       Both nares are packed for management of  epistaxis-attaching suture is taped onto cheeks bilaterally. Oropharynx and oral mucosa are dry at this time.  Eyes: Pupils are equal, round, and reactive to light. No scleral icterus.       Divergent squint noted  Neck: Normal range of motion. Neck supple. JVD present. No tracheal deviation present. No thyromegaly present.       JVD 5 cm  Cardiovascular: Normal rate and regular rhythm.  Exam reveals no gallop and no friction rub.   No murmur heard. Respiratory: Effort normal. No stridor. No respiratory distress. He has no wheezes. He has rales. He exhibits no tenderness.       Fine rales audible bibasally  GI: Soft. Bowel sounds are normal. He exhibits no distension and no mass. There is no tenderness. There is no rebound and no guarding.  Musculoskeletal: Normal range of motion. He exhibits no tenderness.       3+ bipedal edema extending to the knees  Lymphadenopathy:    He has no cervical adenopathy.  Neurological:       Lethargic status post exposure to anesthesia-unable to test neurological system.  Skin: Skin is warm and dry. No rash noted. He is not diaphoretic. No pallor.  Psychiatric:       Unable to assess status post anesthesia    Assessment/Plan: 1. ARF on CKD 4 versus progressive CKD now to ESRD: Suspect that with his lapses with compliance and poorly controlled blood pressure, Mr. Berteau has likely involved into end-stage renal disease. His volume status is on the hypervolemic and and he would benefit from reinitiation of diuretic therapy to help improve not only his peripheral edema but what also appears to be pulmonary edema. I attempted discussions regarding renal replacement therapy/dialysis however recognized that he was not comprehending what I was trying to tell him due to his mental status. I will go ahead and obtain venous mapping with plans for placement of hemodialysis access in this hospitalization prior to his discharge given his lapses with compliance. This of  course, will have to follow extensive discussions as to whether he will be agreeable to initiate dialysis. His epistaxis at this time may be due to the uremic dysfunctional platelets and I will go ahead and empirically treat him with DDAVP. Difficult to assess for other uremic symptoms at this time given the pitfalls of obtaining his history/evaluating for asterixis. His discordant BUN elevation is in part from the epistaxis-blood to upper GIT. For the benefit of doubt and to help with her management further, we'll check a renal ultrasound, urine protein to creatinine ratio and hepatitis B surface antigen as well. 2. Hypertensive urgency: Agree with initiation of antihypertensive therapy for partial control of his blood pressure at this time-suspect that noncompliance was the main reason for his elevated blood pressures however, would also evaluate for the presence of cocaine use. 3. Epistaxis: Probably associated with hypertension but also in part from dysfunctional platelets of advanced renal dysfunction/uremia. Acute management as done by ENT will be supplemented with DDAVP administration. 4. Anemia of chronic kidney disease complicated by acute blood loss anemia from epistaxis: Iron panel and ferritin will be checked today and  ESA therapy initiated in the near future. 5. Metabolic bone disease: Noted to be profoundly hypocalcemic, he has received 1 g of intravenous calcium gluconate and a will go ahead and start him on oral calcium carbonate supplementation. Will check his intact PTH level and 25 hydroxyvitamin D. level to see if these warrant treatment as well.  Kameko Hukill K. 07/15/2012, 3:23 PM

## 2012-07-15 NOTE — Anesthesia Preprocedure Evaluation (Addendum)
Anesthesia Evaluation  Patient identified by MRN, date of birth, ID band Patient awake    Reviewed: Allergy & Precautions, H&P , NPO status , Patient's Chart, lab work & pertinent test results, reviewed documented beta blocker date and time   Airway Mallampati: II      Dental  (+) Teeth Intact and Dental Advisory Given   Pulmonary  breath sounds clear to auscultation        Cardiovascular hypertension, Pt. on medications +CHF Rhythm:Regular Rate:Normal  EF 30-35% Echo 2011   Neuro/Psych Hx substance abuse   GI/Hepatic   Endo/Other    Renal/GU CRFRenal diseaseStage 4     Musculoskeletal   Abdominal   Peds  Hematology   Anesthesia Other Findings   Reproductive/Obstetrics                          Anesthesia Physical Anesthesia Plan  ASA: IV and Emergent  Anesthesia Plan: General   Post-op Pain Management:    Induction: Intravenous and Rapid sequence  Airway Management Planned: Oral ETT  Additional Equipment:   Intra-op Plan:   Post-operative Plan: Extubation in OR  Informed Consent: I have reviewed the patients History and Physical, chart, labs and discussed the procedure including the risks, benefits and alternatives for the proposed anesthesia with the patient or authorized representative who has indicated his/her understanding and acceptance.   Dental advisory given  Plan Discussed with: CRNA and Surgeon  Anesthesia Plan Comments: (Uncontrolled Epistxis Renal Failure has not started HD K-3.9 Htn H/O LV dysfunction EF 30-35% with LVH H/O polysubstance abuse  Roberts Gaudy, MD )       Anesthesia Quick Evaluation

## 2012-07-15 NOTE — Op Note (Signed)
DATE OF OPERATION: 07/15/12 Surgeon: Ruby Cola Procedure Performed: bilateral nasal endoscopy and control of nasal hemorrhage with endoscopic ligation of bilateral sphenopalatine arteries [id number]  PREOPERATIVE DIAGNOSIS: refractory epistaxis POSTOPERATIVE DIAGNOSIS: refractory epistaxis  SURGEON: Ruby Cola ANESTHESIA: General endotracheal.  ESTIMATED BLOOD LOSS: Approximately 50 mL.  DRAINS/DRESSINGS: bilateral 9cm merocele nasal packs secured to face. SPECIMENS: none INDICATIONS: The patient is a 46yo with a history of refractory epistaxis with renal failure and hypertension. DESCRIPTION OF OPERATION: The patient was brought to the operating room and was placed in the supine position and was placed under general endotracheal anesthesia by anesthesiology. The patient's nose was inspected the nose was decongested with Afrin-coated pledgets which were then removed. The patient was prepped and draped in the usual sterile fashion. After the Afrin pledgets were removed, the inferior turbinates were outfractured. Bilateral greater palatine blocks and bilateral endoscopic sphenopalatine artery blocks were placed with 2% lidocaine with epinephrine. The nose was irrigated out with 245mL of sterile saline.  I first began on the left. The middle turbinate was deflected medially and the crista ethmoidalis was identified. The Cottle was used to elevate the mucosa off of the crista ethmoidalis and the sphenopalatine foramen was identified with the sphenopalatine artery coursing out of it. The Bovie suction cautery was used to ligate and cauterize the left sphenopalatine artery. I then used the suction Bovie to thoroughly cauterize diffuse mucosal bleeding from the inferior and middle turbinate, anterior septum , and adenoid pad.  Next we turned to the right side. The middle turbinate was deflected medially and the crista ethmoidalis was identified. The Cottle was used to elevate the mucosa off of the  crista ethmoidalis and the sphenopalatine foramen was identified with the sphenopalatine artery coursing out of it. The Bovie suction cautery was used to ligate and cauterize the right sphenopalatine artery. I then cauterized diffuse mucosal bleeding from the adenoid pad, inferior and medial turbinate, and anterior septum. I then placed bilateral Surgiflo thrombin foam in the nose, followed by bilateral Mupirocin-coated 9cm merocele nasal packs. Hemostasis was noted. I secured the meroceles to his face with Benzoin and steri-strips.  The nose and stomach were suctioned out and the patient was turned over to anesthesia and awakened from anesthesia and extubated without difficulty. The patient tolerated the procedure well with no immediate complications and was taken to the postoperative recovery area in good condition.   Dr. Ruby Cola was present and performed the entire procedure. 07/15/12 4:17 PM Ruby Cola

## 2012-07-15 NOTE — ED Notes (Signed)
Pt sts his nose started bleeding this morning about 0600. He is unsure if he is on any blood thinners and unsure which side it feels like it is bleeding from.

## 2012-07-15 NOTE — Transfer of Care (Signed)
Immediate Anesthesia Transfer of Care Note  Patient: [name] [name]  Procedure(s) Performed: Procedure(s) (LRB): NASAL ENDOSCOPY WITH EPISTAXIS CONTROL (Bilateral)  Patient Location: PACU  Anesthesia Type: General  Level of Consciousness: awake and alert   Airway & Oxygen Therapy: Patient Spontanous Breathing and Patient connected to face mask oxygen  Post-op Assessment: Report given to PACU RN and Post -op Vital signs reviewed and stable  Post vital signs: Reviewed and stable  Complications: No apparent anesthesia complications

## 2012-07-15 NOTE — ED Provider Notes (Signed)
History     CSN: [id number]  Arrival date & time 07/15/12  [id number]   First MD Initiated Contact with Patient 07/15/12 [phone]      Chief Complaint  Patient presents with  . Epistaxis    (Consider location/radiation/quality/duration/timing/severity/associated sxs/prior treatment) Patient is a 46 y.o. male presenting with nosebleeds. The history is provided by the patient.  Epistaxis  This is a new problem. The current episode started 1 to 2 hours ago. The problem occurs rarely. The problem has not changed since onset.The problem is associated with an unknown factor. The bleeding has been from both nares. He has tried applying pressure for the symptoms. The treatment provided no relief.   Patient also has bilateral leg swelling x 1 month. Patient complains of substernal CP x 1 month, worse with exertion. No SOB, no ab pain, no cough. Also has hx of CKD but hasn't followed up in 2 years.   Past Medical History  Diagnosis Date  . Hypertensive urgency   . Hypertension   . CKD (chronic kidney disease) stage 4, GFR 15-29 ml/min   . Systolic CHF     Ef [id number]  . Medical non-compliance   . Polysubstance abuse     History reviewed. No pertinent past surgical history.  No family history on file.  History  Substance Use Topics  . Smoking status: Current Some Day Smoker  . Smokeless tobacco: Not on file   Comment: Trying to quit; 3 cigs/last 2 weeks  . Alcohol Use: Yes     Beer sometimes      Review of Systems  Constitutional: Negative.   HENT: Positive for nosebleeds.   Eyes: Negative.   Respiratory: Positive for shortness of breath.   Cardiovascular: Positive for chest pain and leg swelling.  Gastrointestinal: Negative.   Genitourinary: Negative.   Musculoskeletal: Positive for joint swelling.  Skin: Negative.   Neurological: Negative.   Hematological: Negative.   Psychiatric/Behavioral: Negative.     Allergies  Review of patient's allergies indicates no known  allergies.  Home Medications   Current Outpatient Rx  Name Route Sig Dispense Refill  . AMLODIPINE BESYLATE 10 MG PO TABS Oral Take 1 tablet (10 mg total) by mouth daily. 30 tablet 11  . CARVEDILOL 12.5 MG PO TABS Oral Take 1 tablet (12.5 mg total) by mouth 2 (two) times daily with a meal. 60 tablet 11  . HYDRALAZINE HCL 50 MG PO TABS Oral Take 1 tablet (50 mg total) by mouth 3 (three) times daily. 30 tablet 11  . ISOSORBIDE MONONITRATE ER 60 MG PO TB24 Oral Take 1 tablet (60 mg total) by mouth daily. 30 tablet 11  . ADULT MULTIVITAMIN W/MINERALS CH Oral Take 1 tablet by mouth daily.    Marland Kitchen VISINE OP Ophthalmic Apply 2 drops to eye daily as needed. For dry eyes      BP 176/126  Pulse 84  Temp 97.9 F (36.6 C) (Oral)  Resp 18  Ht 5\' 9"  (1.753 m)  Wt 160 lb (72.576 kg)  BMI 23.63 kg/m2  SpO2 100%  Physical Exam  Constitutional: He is oriented to person, place, and time. He appears distressed.  HENT:  Nose: No rhinorrhea. Epistaxis is observed.       Bilateral epistaxis, unable to localize bleeding. No active bleeding in OP.   Neck: Normal range of motion. Neck supple.  Cardiovascular: Normal rate, regular rhythm and normal heart sounds.        Mild JVD  Pulmonary/Chest:  Mild crackles bilateral bases.   Abdominal: Soft. Normal appearance.  Musculoskeletal:       1+ edema bilaterally   Neurological: He is alert and oriented to person, place, and time.  Skin: Skin is warm.  Psychiatric: He has a normal mood and affect. His behavior is normal. Judgment and thought content normal.    ED Course  Procedures (including critical care time)  Bilateral rhinorocket placed with some afrin.   Labs Reviewed  CBC WITH DIFFERENTIAL - Abnormal; Notable for the following:    RBC 3.28 (*)     Hemoglobin 9.2 (*)     HCT 27.9 (*)     Eosinophils Relative 7 (*)     All other components within normal limits  COMPREHENSIVE METABOLIC PANEL - Abnormal; Notable for the following:    CO2  13 (*)     Glucose, Bld 134 (*)     BUN 107 (*)     Creatinine, Ser 12.29 (*)     Calcium 4.8 (*)     Total Protein 5.2 (*)     Albumin 2.3 (*)     Total Bilirubin 0.1 (*)     GFR calc non Af Amer 4 (*)     GFR calc Af Amer 5 (*)     All other components within normal limits  PRO B NATRIURETIC PEPTIDE - Abnormal; Notable for the following:    Pro B Natriuretic peptide (BNP) 31955.0 (*)     All other components within normal limits  POCT I-STAT TROPONIN I - Abnormal; Notable for the following:    Troponin i, poc 0.17 (*)     All other components within normal limits   Dg Chest 2 View  07/15/2012  *RADIOLOGY REPORT*  Clinical Data: Epistaxis  CHEST - 2 VIEW  Comparison: 03/10/2011 10/20/2010  Findings: Stable moderate cardiomegaly.  Thoracic aorta contour is normal.  The lungs are clear.  Negative for edema, airspace disease, or pleural effusion.  No acute or suspicious bony abnormality.  Visualized upper abdomen is normal.  IMPRESSION: Stable moderate cardiomegaly with mild pulmonary vascular congestion.  The lungs are clear.  Original Report Authenticated By: Curlene Dolphin, M.D.     No diagnosis found.    MDM  Patient is a 46 yo M with hx of CKD with poor f/u here with bilateral epistaxis and 1 month exertional CP, SOB, leg swelling. Bilateral rhinorocket placed. Will need CBC, CMP, Trop, BNP. Admit to tele.   12:09 PM Patient's L rhinorocket fell out. ENT called, who recommended merocel and will evaluate patient. Patient has positive trop but will avoid aspirin given active bleeding. Has elevated Cr 12 and Ca 4.8. Will give Ca gluconate and admit to medicine to evaluate for possible dialysis.   3:22 PM ENT evaluated patient, will take to OR. Admitted to Medicine for renal failure.      Wandra Arthurs, MD 07/15/12 [phone]

## 2012-07-15 NOTE — ED Notes (Signed)
Rhino rocket in left nare came out. Dr. Darl Householder made aware. Large clot still present in left nare after the packing removed.

## 2012-07-15 NOTE — ED Notes (Signed)
Dr. Darl Householder made aware of Calcium level of 4.8

## 2012-07-15 NOTE — H&P (Addendum)
07/15/12 1:34 PM  [name] [name]  PREOPERATIVE HISTORY AND PHYSICAL  CHIEF COMPLAINT: bilateral epistaxis, renal failure, hypertension  HISTORY: This is a 46 year old who presents with renal failure with creatinine of 12 and hypertension (BP in the 170's over 100's) who has been having significant bilateral epistaxis for several hours. ER physician attempted to stop the bleeding with bilateral packs which was unsuccessful, ENT is consulted for refractory epistaxis.  He now presents for nasal endoscopy with cautery and packing.  Dr. Simeon Craft, Al[place] Dust has discussed the risks, benefits, and alternatives of this procedure. The patient understands the risks and would like to proceed with the procedure. The chances of success of the procedure are >50% and the patient understands this. I personally performed an examination of the patient within 24 hours of the procedure.  PAST MEDICAL HISTORY: Past Medical History  Diagnosis Date  . Hypertensive urgency   . Hypertension   . CKD (chronic kidney disease) stage 4, GFR 15-29 ml/min   . Systolic CHF     Ef [id number]  . Medical non-compliance   . Polysubstance abuse     PAST SURGICAL HISTORY: History reviewed. No pertinent past surgical history.  MEDICATIONS: No current facility-administered medications on file prior to encounter.   Current Outpatient Prescriptions on File Prior to Encounter  Medication Sig Dispense Refill  . amLODipine (NORVASC) 10 MG tablet Take 1 tablet (10 mg total) by mouth daily.  30 tablet  11  . carvedilol (COREG) 12.5 MG tablet Take 1 tablet (12.5 mg total) by mouth 2 (two) times daily with a meal.  60 tablet  11  . hydrALAZINE (APRESOLINE) 50 MG tablet Take 1 tablet (50 mg total) by mouth 3 (three) times daily.  30 tablet  11  . isosorbide mononitrate (IMDUR) 60 MG 24 hr tablet Take 1 tablet (60 mg total) by mouth daily.  30 tablet  11    ALLERGIES: No Known Allergies  SOCIAL HISTORY: History   Social History  .  Marital Status: Single    Spouse Name: N/A    Number of Children: N/A  . Years of Education: N/A   Occupational History  . Not on file.   Social History Main Topics  . Smoking status: Current Some Day Smoker  . Smokeless tobacco: Not on file   Comment: Trying to quit; 3 cigs/last 2 weeks  . Alcohol Use: Yes     Beer sometimes  . Drug Use: Yes     Marijuana  . Sexually Active: Not on file   Other Topics Concern  . Not on file   Social History Narrative  . No narrative on file    FAMILY HISTORY:No family history on file.  REVIEW OF SYSTEMS:  Epistaxis, otherwise negative x 10 systems except per HPI   PHYSICAL EXAM:  GENERAL:  NAD, awake, alert VITAL SIGNS:   Filed Vitals:   07/15/12 1236  BP: 168/115  Pulse: 65  Temp:   Resp:    SKIN:  Warm, dry HEENT:  Oral cavity clear other than posterior clots, bilateral nasal packs in place that are completely soaked with blood with active oozing NECK:  Supple, trachea midline LYMPH:  No LAD LUNGS:  Grossly clear CARDIOVASCULAR:  RRR ABDOMEN:  soft MUSCULOSKELETAL: normal strength PSYCH:  normal NEUROLOGIC:  Cn 2-12 intact and symmetric  DIAGNOSTIC STUDIES: Cr 12, hemoglobin ~9  ASSESSMENT AND PLAN: Plan to proceed with nasal endoscopy and bilateral nasal cautery in the OR with nasal packing. Patient understands the risks, benefits,  and alternatives. Informed written consent on chart. Patient will be admitted to medicine service to manage his multiple significant medical problems. 07/15/12 1:33 PM [name] [name]

## 2012-07-15 NOTE — Progress Notes (Signed)
[name] [name], is a 46 y.o. male,   MRN: [id number]  -  DOB - Jan 26, 1966  Outpatient Primary MD for the patient is Pcp Not In System   Blood pressure 173/121, pulse 68, temperature 97.9 F (36.6 C), temperature source Oral, resp. rate 10, height 5\' 9"  (1.753 m), weight 72.576 kg (160 lb), SpO2 100.00%.  Active Problems:  * No active hospital problems. *    46 yo male with epistaxis despite rhino rocket.  H/O CKD 4.  Did not follow up.  Now Creatinine 12.  Patient with BNP of 31,000+  Bilateral leg swelling, and increased dyspnea on exertion.  ENT taking patient to OR to cauterize nasal bleeding. Request Step-down level of care.  Dr. Elmarie Shiley of [facility] has been consulted.  Imogene Burn, PA-C [facility] Pager: [phone]

## 2012-07-15 NOTE — ED Notes (Signed)
Troponin of 0.17 called from minilab. Dr. Darl Householder made aware.

## 2012-07-16 DIAGNOSIS — Z01810 Encounter for preprocedural cardiovascular examination: Secondary | ICD-10-CM

## 2012-07-16 DIAGNOSIS — I517 Cardiomegaly: Secondary | ICD-10-CM

## 2012-07-16 DIAGNOSIS — F1010 Alcohol abuse, uncomplicated: Secondary | ICD-10-CM

## 2012-07-16 DIAGNOSIS — N186 End stage renal disease: Secondary | ICD-10-CM

## 2012-07-16 LAB — CBC
HCT: 19.9 % — ABNORMAL LOW (ref 39.0–52.0)
HCT: 21.4 % — ABNORMAL LOW (ref 39.0–52.0)
Hemoglobin: 7.6 g/dL — ABNORMAL LOW (ref 13.0–17.0)
Hemoglobin: 6.9 g/dL — CL (ref 13.0–17.0)
Hemoglobin: 7.4 g/dL — ABNORMAL LOW (ref 13.0–17.0)
MCH: 28.6 pg (ref 26.0–34.0)
MCHC: 33.9 g/dL (ref 30.0–36.0)
MCHC: 34.7 g/dL (ref 30.0–36.0)
MCHC: 34.6 g/dL (ref 30.0–36.0)
MCV: 84.2 fL (ref 78.0–100.0)
MCV: 83.6 fL (ref 78.0–100.0)
Platelets: 332 10*3/uL (ref 150–400)
RDW: 14.1 % (ref 11.5–15.5)
WBC: 10.7 10*3/uL — ABNORMAL HIGH (ref 4.0–10.5)

## 2012-07-16 LAB — RENAL FUNCTION PANEL
Albumin: 2.4 g/dL — ABNORMAL LOW (ref 3.5–5.2)
Calcium: 5 mg/dL — CL (ref 8.4–10.5)
GFR calc Af Amer: 5 mL/min — ABNORMAL LOW (ref >90)
GFR calc non Af Amer: 4 mL/min — ABNORMAL LOW (ref >90)
Glucose, Bld: 128 mg/dL — ABNORMAL HIGH (ref 70–99)
Phosphorus: 7.9 mg/dL — ABNORMAL HIGH (ref 2.3–4.6)
Sodium: 137 mEq/L (ref 135–145)

## 2012-07-16 LAB — RAPID URINE DRUG SCREEN, HOSP PERFORMED
Barbiturates: NOT DETECTED
Cocaine: POSITIVE — AB
Tetrahydrocannabinol: POSITIVE — AB

## 2012-07-16 LAB — HEPATITIS B SURFACE ANTIGEN: Hepatitis B Surface Ag: NEGATIVE

## 2012-07-16 LAB — COMPREHENSIVE METABOLIC PANEL
Alkaline Phosphatase: 49 U/L (ref 39–117)
BUN: 129 mg/dL — ABNORMAL HIGH (ref 6–23)
CO2: 15 mEq/L — ABNORMAL LOW (ref 19–32)
Chloride: 102 mEq/L (ref 96–112)
GFR calc Af Amer: 5 mL/min — ABNORMAL LOW (ref >90)
Glucose, Bld: 116 mg/dL — ABNORMAL HIGH (ref 70–99)
Potassium: 4 mEq/L (ref 3.5–5.1)
Total Bilirubin: 0.1 mg/dL — ABNORMAL LOW (ref 0.3–1.2)

## 2012-07-16 LAB — VITAMIN D 25 HYDROXY (VIT D DEFICIENCY, FRACTURES): Vit D, 25-Hydroxy: 26 ng/mL — ABNORMAL LOW (ref 30–89)

## 2012-07-16 LAB — PROTEIN / CREATININE RATIO, URINE: Creatinine, Urine: 83.93 mg/dL

## 2012-07-16 LAB — CALCIUM, IONIZED: Calcium, Ion: 0.63 mmol/L — CL (ref 1.12–1.32)

## 2012-07-16 LAB — CARDIAC PANEL(CRET KIN+CKTOT+MB+TROPI)
CK, MB: 11.6 ng/mL (ref 0.3–4.0)
Total CK: 520 U/L — ABNORMAL HIGH (ref 7–232)

## 2012-07-16 LAB — IRON AND TIBC: Saturation Ratios: 17 % — ABNORMAL LOW (ref 20–55)

## 2012-07-16 MED ORDER — CALCIUM ACETATE 667 MG PO CAPS
1334.0000 mg | ORAL_CAPSULE | Freq: Three times a day (TID) | ORAL | Status: DC
  Administered 2012-07-16 – 2012-07-22 (×9): 1334 mg via ORAL
  Filled 2012-07-16 (×20): qty 2

## 2012-07-16 MED ORDER — PARICALCITOL 5 MCG/ML IV SOLN
2.0000 ug | INTRAVENOUS | Status: DC
  Administered 2012-07-17 – 2012-07-20 (×2): 2 ug via INTRAVENOUS
  Filled 2012-07-16 (×3): qty 0.4

## 2012-07-16 MED ORDER — SODIUM CHLORIDE 0.9 % IV SOLN: 125.0000 mg | INTRAVENOUS | Status: DC

## 2012-07-16 MED ORDER — CLONIDINE HCL 0.1 MG PO TABS
0.1000 mg | ORAL_TABLET | ORAL | Status: DC | PRN
  Administered 2012-07-16 – 2012-07-18 (×3): 0.1 mg via ORAL
  Filled 2012-07-16 (×3): qty 1

## 2012-07-16 MED ORDER — SODIUM CHLORIDE 0.9 % IV SOLN
125.0000 mg | INTRAVENOUS | Status: DC
  Administered 2012-07-17 – 2012-07-22 (×2): 125 mg via INTRAVENOUS
  Filled 2012-07-16 (×6): qty 10

## 2012-07-16 MED ORDER — DARBEPOETIN ALFA-POLYSORBATE 100 MCG/0.5ML IJ SOLN
100.0000 ug | INTRAMUSCULAR | Status: DC
  Administered 2012-07-17: 100 ug via INTRAVENOUS
  Filled 2012-07-16: qty 0.5

## 2012-07-16 MED ORDER — HYDROCODONE-ACETAMINOPHEN 5-325 MG PO TABS
1.0000 | ORAL_TABLET | Freq: Four times a day (QID) | ORAL | Status: DC | PRN
  Administered 2012-07-16 – 2012-07-17 (×4): 1 via ORAL
  Administered 2012-07-18 – 2012-07-22 (×6): 2 via ORAL
  Filled 2012-07-16: qty 2
  Filled 2012-07-16: qty 1
  Filled 2012-07-16: qty 2
  Filled 2012-07-16: qty 1
  Filled 2012-07-16: qty 2
  Filled 2012-07-16 (×2): qty 1
  Filled 2012-07-16 (×2): qty 2

## 2012-07-16 NOTE — Progress Notes (Signed)
Contacted MD due to latest BP 170/97.  Last dose of Hydralazine restricts an administration at this time.  MD instructed nurse to give dose of Coreg due at 1700 and place an order for 0.1mg  Clonidine PRN with specific instructions.  Nurse administered Coreg and placed order as MD specified.  Nurse will continue to monitor.

## 2012-07-16 NOTE — Progress Notes (Signed)
VASCULAR LAB PRELIMINARY  PRELIMINARY  PRELIMINARY  PRELIMINARY  Right  Upper Extremity Vein Map    Cephalic  Segment Diameter Depth Comment  1. Axilla 0.48mm mm   2. Mid upper arm 1.37mm mm   3. Above AC 1.67mm mm   4. In [facility] 1.46mm mm   5. Below AC 2.47mm mm thickening  6. Mid forearm 2.19mm mm thickening  7. Wrist 1.62mm mm    mm mm    mm mm    mm mm    Basilic  Segment Diameter Depth Comment  1. Prox upper arm 4.14mm 5.80mm origin  2. Mid upper arm 2.11mm 7.92mm   3. Above [facility] 4.67mm 6.105mm thickening  4. In [facility] 2.40mm 4.81mm branch  5. Below AC 1.39mm 4.44mm   6. Mid forearm 1.13mm 4.58mm branch  7. Wrist 1.37mm 1.69mm    mm mm    mm mm    mm mm      Left Upper Extremity Vein Map    Cephalic  Segment Diameter Depth Comment  1. Axilla 61mm mm   2. Mid upper arm 72mm mm   3. Above AC 1.38mm mm   4. In [facility] 1.20mm mm   5. Below AC 1.76mm mm   6. Mid forearm mm mm Too small  7. Wrist mm mm Too small   mm mm    mm mm    mm mm    Basilic  Segment Diameter Depth Comment  1. Axilla mm mm   2. Mid upper arm 2.88mm 27mm origin  3. Above [facility] 2.33mm 11.58mm thickening  4. In [facility] 2.58mm 10.42mm branch  5. Below AC 1.49mm 3.6mm   6. Mid forearm mm mm Too small  7. Wrist mm mm Too small   mm mm    mm mm    mm mm     Landry Mellow, RDMS, RVT 07/16/2012, 12:07 PM

## 2012-07-16 NOTE — Progress Notes (Signed)
CRITICAL VALUE ALERT  Critical value received:  CK,MB: 10.1  Date of notification:  07/15/12  Time of notification:  2308  Critical value read back:yes  Nurse who received alert:  Eliane Decree, RN   MD notified (1st page):  Frederik Pear, NP  Time of first page:  2310  MD notified (2nd page):  Time of second page:  Responding MD:  Frederik Pear, NP  Time MD responded:  [phone]

## 2012-07-16 NOTE — Progress Notes (Signed)
Subjective:  Patient feels little better. He had mild nose bleeding and uses 2 tissues every hour overnight. He dose not have nausea or vomiting. Afebrile.   Objective: Vital signs in last 24 hours: Filed Vitals:   07/16/12 0200 07/16/12 0300 07/16/12 0638 07/16/12 0800  BP: 160/107 143/105 171/120 150/112  Pulse:  81 81 78  Temp:  97.7 F (36.5 C)  97.5 F (36.4 C)  TempSrc:  Oral  Oral  Resp:  17  12  Height:      Weight:      SpO2:  100%  100%   Weight change:   Intake/Output Summary (Last 24 hours) at 07/16/12 0838 Last data filed at 07/16/12 0800  Gross per 24 hour  Intake    780 ml  Output   1850 ml  Net  -1070 ml   Urine 1250 Net: -470  Body weight:  07/15/12 0942  160lb 00 oz  Vitals: T: 98.1      HR: 81       BP: 171/120      RR: 17      O2 saturation: 100%  General: resting in bed, not in acute distress. HEENT: PERRL, no scleral icterus. Both nares are packed for management of epistaxis. There is no active bleeding when I saw patient. Oropharynx and oral mucosa are dry at this time.  Cardiac: S1/S2, RRR, No murmurs, gallops or rubs Pulm: Good air movement bilaterally, Clear to auscultation bilaterally, No rales, wheezing, rhonchi or rubs. Abd: Soft,  nondistended, nontender, no rebound pain, no organomegaly, BS present Ext: No rashes,  2+DP/PT pulse bilaterally. 1+ petting edema bilaterally over leg/thigh Musculoskeletal: No joint deformities, erythema, or stiffness, ROM full and no nontender Skin: no rashes. No skin bruise. Neuro: alert and oriented X3, cranial nerves II-XII grossly intact, muscle strength 5/5 in all extremeties,  sensation to light touch intact.   Lab Results: Basic Metabolic Panel:  Lab [id number] 0410 07/15/12 1806 07/15/12 1007  NA 137 -- 135  K 4.1 -- 3.9  CL 101 -- 100  CO2 15* -- 13*  GLUCOSE 128* -- 134*  BUN 126* -- 107*  CREATININE 11.93* -- 12.29*  CALCIUM 5.0* -- 4.8*  MG -- -- --  PHOS 7.9* 9.2* --   Liver Function  Tests:  Lab 07/16/12 0410 07/15/12 1007  AST -- 20  ALT -- 17  ALKPHOS -- 53  BILITOT -- 0.1*  PROT -- 5.2*  ALBUMIN 2.4* 2.3*   CBC:  Lab 07/16/12 0410 07/15/12 2144 07/15/12 1007  WBC 10.9* 11.0* --  NEUTROABS -- -- 4.1  HGB 7.6* 8.3* --  HCT 22.4* 24.6* --  MCV 84.2 86.0 --  PLT 332 335 --   Cardiac Enzymes:  Lab 07/16/12 0410 07/15/12 2146  CKTOTAL 585* 569*  CKMB 11.6* 10.1*  CKMBINDEX -- --  TROPONINI <0.30 <0.30   BNP:  Lab 07/15/12 1008  PROBNP 31955.0*     Lab 07/15/12 1806  LABPROT 16.9*  INR 1.35   Anemia Panel:  Lab 07/15/12 2144 07/15/12 1806  VITAMINB12 -- --  FOLATE -- --  FERRITIN -- 27  TIBC -- 337  IRON -- 57  RETICCTPCT 2.6 --   Urine Drug Screen: Drugs of Abuse     Component Value Date/Time   LABOPIA NONE DETECTED 07/15/2012 2328   COCAINSCRNUR POSITIVE* 07/15/2012 2328   LABBENZ NONE DETECTED 07/15/2012 2328   AMPHETMU NONE DETECTED 07/15/2012 2328   THCU POSITIVE* 07/15/2012 2328   LABBARB NONE  DETECTED 07/15/2012 2328    Alcohol Level: No results found for this basename: ETH:2 in the last 168 hours Urinalysis:  Lab 07/15/12 2328  COLORURINE YELLOW  LABSPEC 1.015  PHURINE 5.0  GLUCOSEU NEGATIVE  HGBUR NEGATIVE  BILIRUBINUR NEGATIVE  KETONESUR NEGATIVE  PROTEINUR NEGATIVE  UROBILINOGEN 0.2  NITRITE NEGATIVE  LEUKOCYTESUR NEGATIVE    Micro Results: No results found for this or any previous visit (from the past 240 hour(s)). Studies/Results: Dg Chest 2 View  07/15/2012  *RADIOLOGY REPORT*  Clinical Data: Epistaxis  CHEST - 2 VIEW  Comparison: 03/10/2011 10/20/2010  Findings: Stable moderate cardiomegaly.  Thoracic aorta contour is normal.  The lungs are clear.  Negative for edema, airspace disease, or pleural effusion.  No acute or suspicious bony abnormality.  Visualized upper abdomen is normal.  IMPRESSION: Stable moderate cardiomegaly with mild pulmonary vascular congestion.  The lungs are clear.  Original Report Authenticated  By: Curlene Dolphin, M.D.   Ct Head Wo Contrast  07/15/2012  *RADIOLOGY REPORT*  Clinical Data: 46 year old male with headache and epistaxis.  CT HEAD WITHOUT CONTRAST  Technique:  Contiguous axial images were obtained from the base of the skull through the vertex without contrast.  Comparison: 07/14/2012  Findings: No intracranial abnormalities are identified, including mass lesion or mass effect, hydrocephalus, extra-axial fluid collection, midline shift, hemorrhage, or acute infarction.  The visualized bony calvarium is unremarkable. High attenuation fluid in the nasal cavity and paranasal sinuses noted compatible with blood.  IMPRESSION: No evidence of intracranial quality.  Blood in the nasal cavity and paranasal sinuses.  Original Report Authenticated By: Lura Em, M.D.   US Renal  07/15/2012  *RADIOLOGY REPORT*  Clinical Data: 46 year old male with acute on chronic renal failure.  RENAL/URINARY TRACT ULTRASOUND COMPLETE  Comparison:  10/21/2010 ultrasound  Findings:  Right Kidney:  The right kidney is small and echogenic measuring 6 cm in greatest longitudinal dimension.  There is no evidence of solid mass, hydronephrosis or definite renal calculi.  Left Kidney:  The left kidney is small and echogenic measuring 7.5 cm in greatest longitudinal dimension.  There is no evidence of hydronephrosis, solid renal mass or definite renal calculi.  Bladder:  The bladder is normal for degree of filling.  IMPRESSION: Small echogenic kidneys compatible with chronic medical renal disease.  No evidence of hydronephrosis.  Original Report Authenticated By: Lura Em, M.D.   Medications: Scheduled Meds:    . calcium carbonate  1,000 mg of elemental calcium Oral TID  . calcium gluconate 1 GM IV  1 g Intravenous Once  . carvedilol  12.5 mg Oral BID WC  . cefTRIAXone (ROCEPHIN)  IV  1 g Intravenous Q24H  . desmopressin (DDAVP) IV  20 mcg Intravenous Once  . folic acid  1 mg Oral Daily  . furosemide  40 mg  Intravenous Q12H  . isosorbide mononitrate  60 mg Oral Daily  . LORazepam  0-4 mg Oral Q6H   Followed by  . LORazepam  0-4 mg Oral Q12H  . metoprolol  10 mg Intravenous Once  .  morphine injection  4 mg Intravenous Once  . multivitamin with minerals  1 tablet Oral Daily  . ondansetron (ZOFRAN) IV  4 mg Intravenous Once  . oxymetazoline  1 spray Each Nare Once  . oxymetazoline  2 spray Each Nare BID  . pantoprazole (PROTONIX) IV  40 mg Intravenous Q24H  . sodium chloride  3 mL Intravenous Q12H  . thiamine  100 mg Oral Daily  Or  . thiamine  100 mg Intravenous Daily   Continuous Infusions:  PRN Meds:.acetaminophen, acetaminophen, albuterol, hydrALAZINE, LORazepam, LORazepam, morphine injection, ondansetron (ZOFRAN) IV, ondansetron, senna-docusate, DISCONTD: 0.9 % irrigation (POUR BTL), DISCONTD: acetaminophen, DISCONTD:  HYDROmorphone (DILAUDID) injection, DISCONTD: lidocaine-EPINEPHrine, DISCONTD: mupirocin cream, DISCONTD: ondansetron (ZOFRAN) IV, DISCONTD: oxymetazoline Assessment/Plan:  1.  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 He had hypervolemic status on admission as evidenced by CXR and leg edema and SOB. He responded to Lasix treatment with good urine output yesterday, but with only minimal improvement of Cre. BUN is worsening which may be related to nose bleeding and GI absorption of blood.  Renal US showed no hydronephrosis and bilaterally small shrunken kidneys. Protein/cre ratio is slightly elevated at 0.16 (normal <0.15).  Venous mapping was ordered.  HBsAg and HBsAb are negative.  He will need chronic dialysis and this is discussed with patient and his mother Chrisotpher Curcio and son CLIP process to be started - has no Medicaid or insurance - will need emergency Medicaid application [facility] called for permanent and temporary access Pt be kept NPO pending access -will continue Lasix 40 mg bid until HD starts -will continue  preparation of venous mapping with plan for placement of hemodialysis access. -Renal function panel in AM  3. Epistaxis: Probably associated with hypertension but also in part from dysfunctional platelets of advanced renal dysfunction/uremia. Acute management as done by ENT. DDAVP was given yesterday. Currently no active bleeding.  2. Hypertensive urgency: bp was 171/121 on admission. Currently on Imdur, lasix and prn IV hydralazine. Today bp is 150/112. Per primary team.   4. Anemia of chronic kidney disease complicated by acute blood loss anemia from epistaxis: both iron and ferritin are at low normal range. It is likely due to iron deficiency.  -will give iron supplement -pending vitamin B12 level Start aranesp and iron with first HD (8/3)   5. 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  Patient will require long term HD.  CLIP process started (note has no medicaid - will need medicaid number before can be d/c).  [facility] has been called for permanent access.  Anticipate first HD on 8/3.  See above for additional plans. Torell Minder B,MD 07/16/2012 12:09 PM

## 2012-07-16 NOTE — Progress Notes (Signed)
Utilization Review Completed.  [name] [name], Muddy T  07/16/2012

## 2012-07-16 NOTE — Progress Notes (Signed)
CRITICAL VALUE ALERT  Critical value received:  Hemoglobin 6.9  Date of notification:  07/16/12  Time of notification:  [id number]  Critical value read back:yes  Nurse who received alert:  Dorena Bodo  MD notified (1st page):  Jamal Maes  Time of first page:  [phone]  MD notified (2nd page):  Time of second page:  Responding MD:  Jamal Maes  Time MD responded:  [phone]

## 2012-07-16 NOTE — Progress Notes (Signed)
NP made aware of pt's BP 160/107 after giving PRN Apresoline. No new orders. Will continue to monitor.

## 2012-07-16 NOTE — Progress Notes (Signed)
Pharmacist Heart Failure Core Measure Documentation  Assessment: [name] [name] has an EF documented as 30 % on 10/2010  by ECHO.  Rationale: Heart failure patients with left ventricular systolic dysfunction (LVSD) and an EF < 40% should be prescribed an angiotensin converting enzyme inhibitor (ACEI) or angiotensin receptor blocker (ARB) at discharge unless a contraindication is documented in the medical record.  This patient is not currently on an ACEI or ARB for HF.  This note is being placed in the record in order to provide documentation that a contraindication to the use of these agents is present for this encounter.  ACE Inhibitor or Angiotensin Receptor Blocker is contraindicated (specify all that apply)  []   ACEI allergy AND ARB allergy []   Angioedema []   Moderate or severe aortic stenosis []   Hyperkalemia []   Hypotension []   Renal artery stenosis [x]   Worsening renal function, preexisting renal disease or dysfunction   [name] [facility] 07/16/2012 10:33 AM

## 2012-07-16 NOTE — Progress Notes (Signed)
Echocardiogram 2D Echocardiogram has been performed.  [name] [name] 07/16/2012, 10:51 AM

## 2012-07-16 NOTE — Progress Notes (Signed)
[facility] PROGRESS NOTE  [name] [name] [id number] DOB: Oct 17, 1966 DOA: 07/15/2012 PCP: Pcp Not In System  Assessment/Plan: #1 acute and chronic renal failure with metabolic acidosis and hypocalcemia: This is likely progression of his chronic kidney disease due to non-compliance with anti-hypertensive regimen. He has agreed to hemodialysis during this hospitalization- Nephrology is following- Defer further management of his ARF, hypocalcemia and his metabolic acidosis to them.  He is currently on Lasix and is diuresing well. No respiratory distress- pulse ox is 100% on RA.    #2 epistaxis: He is been seen by ENT and has had a cauterization. Coagulation panel will be checked.   #3 accelerated hypertension: He'll be placed on his home dose of Coreg and Imdur. Hydralazine intravenously will be utilized as needed. Diastolic continues to be elevated and may be difficult to improve until he has fluid removal. Try Norvasc for further vasodilatation.   #4 elevated troponin: Probably a result of his renal failure. He denies any chest pain. EKG will be obtained. Troponins negative. Echocardiogram will be obtained as well.   #5 anemia likely secondary to acute blood loss: He is to receive 1 unit PRBC today as ordered by nephrology.   #6 history of chronic systolic heart failure with a known EF of 30% (based on ECHO from 2011): He does have significant lower extremity edema. However, no evidence for pulmonary edema at this time. History of heart failure, along with end-stage renal disease is probably contributing to the pedal edema. Follow up on echocardiogram.   #7 history of polysubstance abuse including nicotine, alcohol, as well as cocaine: A urine drug screen is positive for Cocaine and Marijauna. He'll be placed on the Ativan withdrawal protocol. Thiamine will be provided.   #8 Headache: Likely related to epistaxis but we will proceed with Ct of head.   #9 chronic systolic CHF with wall  motion abnormality: will need cardiology workup when stable. Ischemia may have been secondary to cocaine use. Will check lipid profile.    Code Status: full code Disposition Plan: follow in SDU until dialysis is started.    Brief narrative: HPI: This is a 46 year old, African American male, with a past medical history of Hypertension, chronic kidney disease, who is noncompliant, who was in his usual state of health when he woke up at around 5 or 6:00 and felt that his nose was bleeding. Initially, the bleeding seemed to occur from his left nostril, subsequently, both his nostrils started bleeding. The bleeding would not stop despite pressure and, so, he decided to come in to the hospital. He denies picking his nose or blowing his nose recently. Denies being on any anticoagulants. He's had a few episodes of epistaxis in the past, but hasn't required any surgical intervention. He mentioned that for the last 2-3 weeks he's noticed that his ankles have been swollen. He hasn't noticed any decrease in the amount of urination. Denies any blood in the urine. Has been short of breath. However, denies any orthopnea. Shortness of breath is mainly with exertion. He mentioned that he's lost about 25 pounds in the last one year. Denies any blood in the stools or black colored stools. He does have known kidney disease, however, does not follow any nephrologist. Review of his chart reveals that he has been offered dialysis multiple times in the past. However, patient has declined. He denies any chest pain at this time. Denies any cough. No sick contacts.    Consultants:  Nephrology  Vascular  ENT  Procedures:  endoscopic ligation of bilateral sphenopalatine arteries 07/15/12  Antibiotics:  Rocephin 8/1>>  HPI/Subjective: Does not seem very interested in talking with me. Has no complaints. Admits to mile postnasal drip when asked about it. Ankles edema not coming down yet.   Objective: Filed Vitals:    07/16/12 1200 07/16/12 1400 07/16/12 1500 07/16/12 1537  BP: 167/122 157/108  149/100  Pulse: 85 83    Temp: 97.8 F (36.6 C)  98.3 F (36.8 C) 98.3 F (36.8 C)  TempSrc: Oral   Oral  Resp: 20 13  15   Height:      Weight:      SpO2: 100%  100%     Intake/Output Summary (Last 24 hours) at 07/16/12 1549 Last data filed at 07/16/12 1537  Gross per 24 hour  Intake   1407 ml  Output   2850 ml  Net  -1443 ml    Exam:   General:  No distress, alert and sitting up in bed  Cardiovascular: RRR. No murmurs  Respiratory: CTA b/l  Abdomen: soft/ nt/ nd/ bs+  Ext: no c/c/e  Data Reviewed: Basic Metabolic Panel:  Lab [id number] 1321 07/16/12 0410 07/15/12 1806 07/15/12 1007  NA 137 137 -- 135  K 4.0 4.1 -- 3.9  CL 102 101 -- 100  CO2 15* 15* -- 13*  GLUCOSE 116* 128* -- 134*  BUN 129* 126* -- 107*  CREATININE 12.00* 11.93* -- 12.29*  CALCIUM 5.4* 5.0* -- 4.8*  MG -- -- -- --  PHOS -- 7.9* 9.2* --   Liver Function Tests:  Lab 07/16/12 1321 07/16/12 0410 07/15/12 1007  AST 14 -- 20  ALT 13 -- 17  ALKPHOS 49 -- 53  BILITOT 0.1* -- 0.1*  PROT 5.1* -- 5.2*  ALBUMIN 2.4* 2.4* 2.3*   No results found for this basename: LIPASE:5,AMYLASE:5 in the last 168 hours No results found for this basename: AMMONIA:5 in the last 168 hours CBC:  Lab 07/16/12 1321 07/16/12 0410 07/15/12 2144 07/15/12 1007  WBC 10.7* 10.9* 11.0* 6.4  NEUTROABS -- -- -- 4.1  HGB 6.9* 7.6* 8.3* 9.2*  HCT 19.9* 22.4* 24.6* 27.9*  MCV 84.7 84.2 86.0 85.1  PLT 265 332 335 295   Cardiac Enzymes:  Lab 07/16/12 1320 07/16/12 0410 07/15/12 2146  CKTOTAL 520* 585* 569*  CKMB 10.7* 11.6* 10.1*  CKMBINDEX -- -- --  TROPONINI <0.30 <0.30 <0.30   BNP (last 3 results)  Basename 07/15/12 1008  PROBNP 31955.0*   CBG: No results found for this basename: GLUCAP:5 in the last 168 hours  No results found for this or any previous visit (from the past 240 hour(s)).   Studies: Dg Chest 2 View  07/15/2012   *RADIOLOGY REPORT*  Clinical Data: Epistaxis  CHEST - 2 VIEW  Comparison: 03/10/2011 10/20/2010  Findings: Stable moderate cardiomegaly.  Thoracic aorta contour is normal.  The lungs are clear.  Negative for edema, airspace disease, or pleural effusion.  No acute or suspicious bony abnormality.  Visualized upper abdomen is normal.  IMPRESSION: Stable moderate cardiomegaly with mild pulmonary vascular congestion.  The lungs are clear.  Original Report Authenticated By: Curlene Dolphin, M.D.   Ct Head Wo Contrast  07/15/2012  *RADIOLOGY REPORT*  Clinical Data: 46 year old male with headache and epistaxis.  CT HEAD WITHOUT CONTRAST  Technique:  Contiguous axial images were obtained from the base of the skull through the vertex without contrast.  Comparison: 07/14/2012  Findings: No intracranial abnormalities are identified, including mass lesion or  mass effect, hydrocephalus, extra-axial fluid collection, midline shift, hemorrhage, or acute infarction.  The visualized bony calvarium is unremarkable. High attenuation fluid in the nasal cavity and paranasal sinuses noted compatible with blood.  IMPRESSION: No evidence of intracranial quality.  Blood in the nasal cavity and paranasal sinuses.  Original Report Authenticated By: Lura Em, M.D.   US Renal  07/15/2012  *RADIOLOGY REPORT*  Clinical Data: 46 year old male with acute on chronic renal failure.  RENAL/URINARY TRACT ULTRASOUND COMPLETE  Comparison:  10/21/2010 ultrasound  Findings:  Right Kidney:  The right kidney is small and echogenic measuring 6 cm in greatest longitudinal dimension.  There is no evidence of solid mass, hydronephrosis or definite renal calculi.  Left Kidney:  The left kidney is small and echogenic measuring 7.5 cm in greatest longitudinal dimension.  There is no evidence of hydronephrosis, solid renal mass or definite renal calculi.  Bladder:  The bladder is normal for degree of filling.  IMPRESSION: Small echogenic kidneys compatible with  chronic medical renal disease.  No evidence of hydronephrosis.  Original Report Authenticated By: Lura Em, M.D.    Scheduled Meds:   . calcium acetate  1,334 mg Oral TID WC  . carvedilol  12.5 mg Oral BID WC  . cefTRIAXone (ROCEPHIN)  IV  1 g Intravenous Q24H  . darbepoetin (ARANESP) injection - DIALYSIS  100 mcg Intravenous Q Sat-HD  . desmopressin (DDAVP) IV  20 mcg Intravenous Once  . ferric gluconate (FERRLECIT/NULECIT) IV  125 mg Intravenous Q T,Th,Sa-HD  . ferric gluconate (FERRLECIT/NULECIT) IV  125 mg Intravenous Q Thu-HD  . folic acid  1 mg Oral Daily  . furosemide  40 mg Intravenous Q12H  . isosorbide mononitrate  60 mg Oral Daily  . LORazepam  0-4 mg Oral Q6H   Followed by  . LORazepam  0-4 mg Oral Q12H  . multivitamin with minerals  1 tablet Oral Daily  . oxymetazoline  2 spray Each Nare BID  . pantoprazole (PROTONIX) IV  40 mg Intravenous Q24H  . paricalcitol  2 mcg Intravenous Q T,Th,Sa-HD  . sodium chloride  3 mL Intravenous Q12H  . thiamine  100 mg Oral Daily   Or  . thiamine  100 mg Intravenous Daily  . DISCONTD: calcium carbonate  1,000 mg of elemental calcium Oral TID   Continuous Infusions:   ________________________________________________________________________  Time spent: 35 min    [facility]  [facility] Pager [phone] If 8PM-8AM, please contact night-coverage at [email or web address], password [facility] 07/16/2012, 3:49 PM  LOS: 1 day

## 2012-07-16 NOTE — Progress Notes (Signed)
Critical value of CK, MB at 11.1. MD notified via text page [phone]. No response back. MD paged, [phone], no respond. Nephrology resident notified. Reported off to morning shift nurse

## 2012-07-16 NOTE — Progress Notes (Signed)
[facility] [place]  Fully consult to follow.  ARF likely related to ilicit drug use (cocaine and marjuana).  Will schedule the patient for [facility] placement tomorrow.  The patient has documented reluctance to proceed with HD, so I'm not certain he will allow either [facility] placement or permanent access placement.  Laboratory: CBC:    Component Value Date/Time   WBC 10.7* 07/16/2012 1321   RBC 2.35* 07/16/2012 1321   HGB 6.9* 07/16/2012 1321   HCT 19.9* 07/16/2012 1321   PLT 265 07/16/2012 1321   MCV 84.7 07/16/2012 1321   MCH 29.4 07/16/2012 1321   MCHC 34.7 07/16/2012 1321   RDW 14.6 07/16/2012 1321   LYMPHSABS 1.1 07/15/2012 1007   MONOABS 0.7 07/15/2012 1007   EOSABS 0.5 07/15/2012 1007   BASOSABS 0.1 07/15/2012 1007    BMP:    Component Value Date/Time   NA 137 07/16/2012 0410   K 4.1 07/16/2012 0410   CL 101 07/16/2012 0410   CO2 15* 07/16/2012 0410   GLUCOSE 128* 07/16/2012 0410   BUN 126* 07/16/2012 0410   CREATININE 11.93* 07/16/2012 0410   CALCIUM 5.0* 07/16/2012 0410   CALCIUM 8.2* 10/21/2010 1420   GFRNONAA 4* 07/16/2012 0410   GFRAA 5* 07/16/2012 0410    Coagulation: Lab Results  Component Value Date   INR 1.35 07/15/2012   INR 1.00 10/20/2010   INR 0.94 04/20/2010   No results found for this basename: PTT   Cardiac Panel (last 3 results)  Basename 07/16/12 0410 07/15/12 2146  CKTOTAL 585* 569*  CKMB 11.6* 10.1*  TROPONINI <0.30 <0.30  RELINDX 2.0 1.8   Adele Barthel, MD [facility] [place] Office: [phone] Pager: [phone]  07/16/2012, 2:02 PM

## 2012-07-16 NOTE — Progress Notes (Addendum)
Note written by Dorena Bodo, RN at 07/16/12 1930. Written under wrong name. York Grice, RN CRITICAL VALUE ALERT  Critical value received: Ionized Ca drawn 07/15/12  0.63 Date of notification:  07/16/12  Time of notification:  [id number]  Critical value read back:yes  Nurse who received alert:  Dorena Bodo  MD notified (1st page):  Jamal Maes  Time of first page:  [phone]  MD notified (2nd page):  Time of second page:  Responding MD:  Jamal Maes  Time MD responded:  [phone]

## 2012-07-16 NOTE — Consult Note (Addendum)
[facility] [place]  Referred by:  Dr. Jamal Maes  Reason for referral: New access  History of Present Illness  [name] [name] is a 46 y.o. (02/13/1966) male who presents for evaluation for permanent access.  History is obtained from chart review and family as patient kept falling asleep during the history and physical.  The patient is right hand dominant.  The patient has not had previous access procedures.  Previous central venous cannulation procedures include: none.  The patient has never had a PPM placed.  The patient has not be compliant with medical in the past.  He has a document resistance to consideration of hemodialysis.  It appears he came into the hospital with acute renal failure, uncontrolled hypertension, ilicit drug use on UDS, and bilateral epistaxis.  Past Medical History  Diagnosis Date  . Hypertensive urgency   . Hypertension   . CKD (chronic kidney disease) stage 4, GFR 15-29 ml/min   . Systolic CHF     Ef [id number]  . Medical non-compliance   . Polysubstance abuse     History reviewed. No pertinent past surgical history.  History   Social History  . Marital Status: Single    Spouse Name: N/A    Number of Children: N/A  . Years of Education: N/A   Occupational History  . Not on file.   Social History Main Topics  . Smoking status: Current Everyday Smoker    Types: Cigarettes  . Smokeless tobacco: Not on file   Comment: Trying to quit; 3 cigs/last 2 weeks  . Alcohol Use: 5.4 oz/week    7 Cans of beer, 2 Shots of liquor per week     Beer and liquor. Unknown qty. Patient not forthcoming  . Drug Use: Yes     Marijuana  . Sexually Active: Not on file   Other Topics Concern  . Not on file   Social History Narrative  . No narrative on file    Family History  Problem Relation Age of Onset  . Hypertension Mother   . Hypertension Father     No current facility-administered medications on file prior to encounter.   Current  Outpatient Prescriptions on File Prior to Encounter  Medication Sig Dispense Refill  . amLODipine (NORVASC) 10 MG tablet Take 1 tablet (10 mg total) by mouth daily.  30 tablet  11  . carvedilol (COREG) 12.5 MG tablet Take 1 tablet (12.5 mg total) by mouth 2 (two) times daily with a meal.  60 tablet  11  . hydrALAZINE (APRESOLINE) 50 MG tablet Take 1 tablet (50 mg total) by mouth 3 (three) times daily.  30 tablet  11  . isosorbide mononitrate (IMDUR) 60 MG 24 hr tablet Take 1 tablet (60 mg total) by mouth daily.  30 tablet  11    No Known Allergies  REVIEW OF SYSTEMS:  (Positives indicated with an "x", otherwise negative)  CARDIOVASCULAR: [ ]  chest pain    [ ]  chest pressure    [ ]  palpitations    [ ]  orthopnea   [ ]  dyspnea on exert. [ ]  claudication    [ ]  rest pain     [ ]  DVT     [ ]  phlebitis  PULMONARY:    [ ]  productive cough [ ]  asthma  [ ]  wheezing  NEUROLOGIC:    [ ]  weakness    [ ]  paresthesias   [ ]  aphasia    [ ]  amaurosis    [ ]   dizziness  HEMATOLOGIC:    [ ]  bleeding problems  [ ]  clotting disorders  MUSCULOSKEL: [ ]  joint pain     [ ]  joint swelling  GASTROINTEST:  [ ]   blood in stool   [ ]   hematemesis  GENITOURINARY:   [ ]   dysuria    [ ]   hematuria  PSYCHIATRIC:   [ ]  history of major depression  INTEGUMENTARY: [ ]  rashes    [ ]  ulcers  CONSTITUTIONAL:  [ ]  fever     [ ]  chills  Physical Examination  Filed Vitals:   07/16/12 0920 07/16/12 1000 07/16/12 1200 07/16/12 1400  BP: 151/97 148/94 167/122 157/108  Pulse:  75 85 83  Temp:   97.8 F (36.6 C)   TempSrc:   Oral   Resp: 14 12 20 13   Height:      Weight:      SpO2:   100%    Body mass index is 23.63 kg/(m^2).  General: A&O x 3, WDWN  Head: [place]/AT  Ear/Nose/Throat: Hearing grossly intact, nares packed bilaterally  Eyes: limited exam due to AMS  Neck: Supple, no nuchal rigidity, no palpable LAD  Pulmonary: Sym exp, good air movt, CTAB, no rales, rhonchi, & wheezing  Cardiac: RRR, Nl S1,  S2, no Murmurs, rubs or gallops  Vascular: Vessel Right Left  Radial Palpable Palpable  Brachial Palpable Palpable  Carotid Palpable, without bruit Palpable, without bruit  Aorta Non-palpable N/A  Femoral Palpable Palpable  Popliteal Non-palpable Non-palpable  PT Palpable Palpable  DP Palpable Palpable   Gastrointestinal: soft, NTND, -G/R, - HSM, - masses, - CVAT B  Musculoskeletal: Unable to test due to AMS, Extremities without ischemic changes   Neurologic: Unable to cooperate with testing due to AMS  Psychiatric: Unable to cooperate with testing due to AMS  Dermatologic: See M/S exam for extremity exam, no rashes otherwise noted  Lymph : No Cervical, Axillary, or Inguinal lymphadenopathy   Non-Invasive Vascular Imaging  Vein Mapping  (Date: 07/16/12):   R arm: acceptable vein conduits include marginally basilic vein  L arm: acceptable vein conduits include none Laboratory: CBC:    Component Value Date/Time   WBC 10.5 07/17/2012 0520   RBC 2.60* 07/17/2012 0520   HGB 7.6* 07/17/2012 0520   HCT 22.0* 07/17/2012 0520   PLT 239 07/17/2012 0520   MCV 84.6 07/17/2012 0520   MCH 29.2 07/17/2012 0520   MCHC 34.5 07/17/2012 0520   RDW 14.3 07/17/2012 0520   LYMPHSABS 1.1 07/15/2012 1007   MONOABS 0.7 07/15/2012 1007   EOSABS 0.5 07/15/2012 1007   BASOSABS 0.1 07/15/2012 1007    BMP:    Component Value Date/Time   NA 135 07/17/2012 0520   K 3.4* 07/17/2012 0520   CL 100 07/17/2012 0520   CO2 17* 07/17/2012 0520   GLUCOSE 115* 07/17/2012 0520   BUN 126* 07/17/2012 0520   CREATININE 12.07* 07/17/2012 0520   CALCIUM 5.6* 07/17/2012 0520   CALCIUM 8.2* 10/21/2010 1420   GFRNONAA 4* 07/17/2012 0520   GFRAA 5* 07/17/2012 0520    Coagulation: Lab Results  Component Value Date   INR 1.10 07/17/2012   INR 1.35 07/15/2012   INR 1.00 10/20/2010   No results found for this basename: PTT    Medical Decision Making  [name] [name] is a 46 y.o. male who presents with acute on chronic renal failure requiring  hemodialysis, multiple co-morbidities including polysubstance abuse and poorly controlled HTN  Based on vein mapping and  examination, this patient's permanent access options include: possible R BVT vs brachial vein transposition.  He is scheduled for [facility] placement tomorrow.  His family will sign for the consent.  I will defer future determination of permanent access placement until we know if his kidney function will recover.  Also once his mental status is improved, the patient has to make a decision in that regard.  Thank you for consult!  Adele Barthel, MD [facility] [place] Office: [phone] Pager: [phone]  07/16/2012, 2:48 PM

## 2012-07-16 NOTE — Progress Notes (Addendum)
CRITICAL VALUE ALERT  Critical value received:  Calcium 5.4  Date of notification:  07/16/12  Time of notification:  1430  Critical value read back:yes  Nurse who received alert:  Dorena Bodo  MD notified (1st page):  Jamal Maes  Time of first page:  1450  MD notified (2nd page):  Time of second page:  Responding MD:  Jamal Maes  Time MD responded: 1452

## 2012-07-17 ENCOUNTER — Encounter (HOSPITAL_COMMUNITY): Payer: Self-pay | Admitting: Anesthesiology

## 2012-07-17 ENCOUNTER — Inpatient Hospital Stay (HOSPITAL_COMMUNITY): Payer: Medicaid Other | Admitting: Anesthesiology

## 2012-07-17 ENCOUNTER — Inpatient Hospital Stay (HOSPITAL_COMMUNITY): Payer: Medicaid Other

## 2012-07-17 ENCOUNTER — Encounter (HOSPITAL_COMMUNITY)
Admission: EM | Disposition: A | Discharge status: Home / Self Care | Payer: Self-pay | Source: Home / Self Care | Attending: Internal Medicine

## 2012-07-17 HISTORY — PX: INSERTION OF DIALYSIS CATHETER: SHX1324

## 2012-07-17 LAB — CBC
MCHC: 34.5 g/dL (ref 30.0–36.0)
Platelets: 239 10*3/uL (ref 150–400)
RDW: 14.3 % (ref 11.5–15.5)

## 2012-07-17 LAB — LIPID PANEL
LDL Cholesterol: 14 mg/dL (ref 0–99)
VLDL: 21 mg/dL (ref 0–40)

## 2012-07-17 LAB — RENAL FUNCTION PANEL
Calcium: 5.6 mg/dL — CL (ref 8.4–10.5)
GFR calc Af Amer: 5 mL/min — ABNORMAL LOW (ref >90)
GFR calc non Af Amer: 4 mL/min — ABNORMAL LOW (ref >90)
Phosphorus: 7.8 mg/dL — ABNORMAL HIGH (ref 2.3–4.6)
Sodium: 135 mEq/L (ref 135–145)

## 2012-07-17 LAB — HEPATITIS C ANTIBODY (REFLEX): HCV Ab: NEGATIVE

## 2012-07-17 LAB — PROTIME-INR: Prothrombin Time: 14.4 seconds (ref 11.6–15.2)

## 2012-07-17 LAB — HIV ANTIBODY (ROUTINE TESTING W REFLEX): HIV: NONREACTIVE

## 2012-07-17 SURGERY — INSERTION OF DIALYSIS CATHETER
Anesthesia: Monitor Anesthesia Care | Site: Neck | Laterality: Right | Wound class: Clean

## 2012-07-17 MED ORDER — CEPHALEXIN 500 MG PO CAPS
500.0000 mg | ORAL_CAPSULE | Freq: Two times a day (BID) | ORAL | Status: DC
  Administered 2012-07-17 – 2012-07-22 (×8): 500 mg via ORAL
  Filled 2012-07-17 (×11): qty 1

## 2012-07-17 MED ORDER — POTASSIUM CHLORIDE CRYS ER 20 MEQ PO TBCR
40.0000 meq | EXTENDED_RELEASE_TABLET | Freq: Once | ORAL | Status: AC
  Administered 2012-07-17: 40 meq via ORAL
  Filled 2012-07-17 (×2): qty 1

## 2012-07-17 MED ORDER — FENTANYL CITRATE 0.05 MG/ML IJ SOLN
INTRAMUSCULAR | Status: DC | PRN
  Administered 2012-07-17: 100 ug via INTRAVENOUS

## 2012-07-17 MED ORDER — PROPOFOL 10 MG/ML IV BOLUS
INTRAVENOUS | Status: DC | PRN
  Administered 2012-07-17: 200 mg via INTRAVENOUS

## 2012-07-17 MED ORDER — HEPARIN SODIUM (PORCINE) 1000 UNIT/ML IJ SOLN
INTRAMUSCULAR | Status: AC
  Filled 2012-07-17: qty 1

## 2012-07-17 MED ORDER — HEPARIN SODIUM (PORCINE) 1000 UNIT/ML IJ SOLN
INTRAMUSCULAR | Status: DC | PRN
  Administered 2012-07-17: 4.6 mL

## 2012-07-17 MED ORDER — LIDOCAINE-EPINEPHRINE (PF) 1 %-1:200000 IJ SOLN
INTRAMUSCULAR | Status: AC
  Filled 2012-07-17: qty 10

## 2012-07-17 MED ORDER — ONDANSETRON HCL 4 MG/2ML IJ SOLN: 4.0000 mg | Freq: Once | INTRAMUSCULAR | Status: DC | PRN

## 2012-07-17 MED ORDER — SODIUM CHLORIDE 0.9 % IR SOLN
Status: DC | PRN
  Administered 2012-07-17: 09:00:00

## 2012-07-17 MED ORDER — SODIUM CHLORIDE 0.9 % IV SOLN
1.0000 g | Freq: Once | INTRAVENOUS | Status: AC
  Administered 2012-07-17: 1 g via INTRAVENOUS
  Filled 2012-07-17: qty 10

## 2012-07-17 MED ORDER — SODIUM CHLORIDE 0.9 % IV SOLN
INTRAVENOUS | Status: DC | PRN
  Administered 2012-07-17: 08:00:00 via INTRAVENOUS

## 2012-07-17 MED ORDER — LIDOCAINE HCL (CARDIAC) 20 MG/ML IV SOLN
INTRAVENOUS | Status: DC | PRN
  Administered 2012-07-17: 100 mg via INTRAVENOUS

## 2012-07-17 MED ORDER — PARICALCITOL 5 MCG/ML IV SOLN
INTRAVENOUS | Status: AC
  Administered 2012-07-17: 2 ug via INTRAVENOUS
  Filled 2012-07-17: qty 1

## 2012-07-17 MED ORDER — DARBEPOETIN ALFA-POLYSORBATE 100 MCG/0.5ML IJ SOLN
INTRAMUSCULAR | Status: AC
  Administered 2012-07-17: 100 ug via INTRAVENOUS
  Filled 2012-07-17: qty 0.5

## 2012-07-17 MED ORDER — HYDROMORPHONE HCL PF 1 MG/ML IJ SOLN: 0.2500 mg | INTRAMUSCULAR | Status: DC | PRN

## 2012-07-17 SURGICAL SUPPLY — 38 items
BAG DECANTER FOR FLEXI CONT (MISCELLANEOUS) ×2 IMPLANT
CATH CANNON HEMO 15F 50CM (CATHETERS) IMPLANT
CATH CANNON HEMO 15FR 19 (HEMODIALYSIS SUPPLIES) ×2 IMPLANT
CATH CANNON HEMO 15FR 23CM (HEMODIALYSIS SUPPLIES) IMPLANT
CATH CANNON HEMO 15FR 31CM (HEMODIALYSIS SUPPLIES) IMPLANT
CATH CANNON HEMO 15FR 32CM (HEMODIALYSIS SUPPLIES) IMPLANT
CLOTH BEACON ORANGE TIMEOUT ST (SAFETY) ×2 IMPLANT
COVER PROBE W GEL 5X96 (DRAPES) ×2 IMPLANT
COVER SURGICAL LIGHT HANDLE (MISCELLANEOUS) ×2 IMPLANT
DRAPE C-ARM 42X72 X-RAY (DRAPES) ×2 IMPLANT
DRAPE CHEST BREAST 15X10 FENES (DRAPES) ×2 IMPLANT
GAUZE SPONGE 2X2 8PLY STRL LF (GAUZE/BANDAGES/DRESSINGS) ×1 IMPLANT
GAUZE SPONGE 4X4 16PLY XRAY LF (GAUZE/BANDAGES/DRESSINGS) ×2 IMPLANT
GLOVE BIO SURGEON STRL SZ7 (GLOVE) ×2 IMPLANT
GLOVE BIOGEL PI IND STRL 7.5 (GLOVE) ×1 IMPLANT
GLOVE BIOGEL PI INDICATOR 7.5 (GLOVE) ×1
GOWN STRL NON-REIN LRG LVL3 (GOWN DISPOSABLE) ×4 IMPLANT
KIT BASIN OR (CUSTOM PROCEDURE TRAY) ×2 IMPLANT
KIT ROOM TURNOVER OR (KITS) ×2 IMPLANT
NEEDLE 18GX1X1/2 (RX/OR ONLY) (NEEDLE) ×2 IMPLANT
NEEDLE HYPO 25GX1X1/2 BEV (NEEDLE) ×2 IMPLANT
NS IRRIG 1000ML POUR BTL (IV SOLUTION) ×2 IMPLANT
PACK SURGICAL SETUP 50X90 (CUSTOM PROCEDURE TRAY) ×2 IMPLANT
PAD ARMBOARD 7.5X6 YLW CONV (MISCELLANEOUS) ×2 IMPLANT
SOAP 2 % CHG 4 OZ (WOUND CARE) ×2 IMPLANT
SPONGE GAUZE 2X2 STER 10/PKG (GAUZE/BANDAGES/DRESSINGS) ×1
SUT ETHILON 3 0 PS 1 (SUTURE) ×2 IMPLANT
SUT MNCRL AB 4-0 PS2 18 (SUTURE) ×2 IMPLANT
SYR 20CC LL (SYRINGE) ×4 IMPLANT
SYR 30ML LL (SYRINGE) IMPLANT
SYR 3ML LL SCALE MARK (SYRINGE) ×2 IMPLANT
SYR 5ML LL (SYRINGE) ×2 IMPLANT
SYR CONTROL 10ML LL (SYRINGE) ×2 IMPLANT
SYRINGE 10CC LL (SYRINGE) ×2 IMPLANT
TAPE CLOTH SURG 4X10 WHT LF (GAUZE/BANDAGES/DRESSINGS) ×2 IMPLANT
TOWEL OR 17X24 6PK STRL BLUE (TOWEL DISPOSABLE) ×2 IMPLANT
TOWEL OR 17X26 10 PK STRL BLUE (TOWEL DISPOSABLE) ×2 IMPLANT
WATER STERILE IRR 1000ML POUR (IV SOLUTION) IMPLANT

## 2012-07-17 NOTE — Addendum Note (Signed)
Addendum  created 07/17/12 0931 by Warrick Parisian, MD   Modules edited:Orders, PRL Based Order Sets

## 2012-07-17 NOTE — Progress Notes (Addendum)
CRITICAL VALUE ALERT  Critical value received:  Ca: 5.6  Date of notification:  07/17/2012  Time of notification:  0630  Critical value read back:yes  Nurse who received alert:  Eliane Decree, RN   MD notified (1st page):  M. Donnal Debar, NP via text pg  Time of first page:  0632  MD notified (2nd page):  Time of second page:  Responding MD:  M. Donnal Debar, NP  Time MD responded:  [phone]

## 2012-07-17 NOTE — Preoperative (Signed)
Beta Blockers   Reason not to administer Beta Blockers:Not Applicable 

## 2012-07-17 NOTE — Anesthesia Preprocedure Evaluation (Signed)
Anesthesia Evaluation  Patient identified by MRN, date of birth, ID band Patient awake    Reviewed: Allergy & Precautions, H&P , NPO status , Patient's Chart, lab work & pertinent test results  Airway Mallampati: I TM Distance: >3 FB Neck ROM: full    Dental   Pulmonary Current Smoker,          Cardiovascular hypertension, +CHF Rhythm:regular Rate:Normal     Neuro/Psych PSYCHIATRIC DISORDERS    GI/Hepatic   Endo/Other    Renal/GU CRF, ESRF and Dialysis     Musculoskeletal   Abdominal   Peds  Hematology   Anesthesia Other Findings   Reproductive/Obstetrics                           Anesthesia Physical Anesthesia Plan  ASA: III  Anesthesia Plan: MAC   Post-op Pain Management:    Induction: Intravenous  Airway Management Planned: Mask  Additional Equipment:   Intra-op Plan:   Post-operative Plan:   Informed Consent:   Plan Discussed with: CRNA, Anesthesiologist and Surgeon  Anesthesia Plan Comments:         Anesthesia Quick Evaluation

## 2012-07-17 NOTE — Transfer of Care (Signed)
Immediate Anesthesia Transfer of Care Note  Patient: [name] [name]  Procedure(s) Performed: Procedure(s) (LRB): INSERTION OF DIALYSIS CATHETER (Right)  Patient Location: PACU  Anesthesia Type: General  Level of Consciousness: awake, alert  and oriented  Airway & Oxygen Therapy: Patient Spontanous Breathing and Patient connected to face mask oxygen  Post-op Assessment: Report given to PACU RN, Post -op Vital signs reviewed and stable and Patient moving all extremities  Post vital signs: Reviewed and stable  Complications: No apparent anesthesia complications

## 2012-07-17 NOTE — Progress Notes (Signed)
Assumed care of patient - pt currently in OR.

## 2012-07-17 NOTE — Progress Notes (Signed)
Subjective:  Patient feels tired. No active nose bleeding. He dose not have nausea or vomiting. No SOB. Did not eat food; did not have BM.  Afebrile.   Will get tunneled dialysis catheter placement today (07/17/12).  Will get Fistula placement on Wednesday (07/21/12)  Objective: Vital signs in last 24 hours: Filed Vitals:   07/16/12 2300 07/17/12 0132 07/17/12 0416 07/17/12 0500  BP: 162/118 166/110 165/107 171/109  Pulse: 74  71 73  Temp: 97.7 F (36.5 C)  97.4 F (36.3 C)   TempSrc: Oral  Oral   Resp: 13  12   Height:      Weight:   147 lb 11.3 oz (67 kg)   SpO2: 100%  100%    Weight change: -12 lb 4.7 oz (-5.576 kg)  Intake/Output Summary (Last 24 hours) at 07/17/12 0749 Last data filed at 07/17/12 0417  Gross per 24 hour  Intake   1383 ml  Output   2900 ml  Net  -1517 ml   Urine 1250 (8/2); 2900 (8/3)   Body weight:  07/17/12 0416 147 lb 11.3 oz    (67.0  kg) 07/15/12 0942 160 lb                 (72.6 kg)  Vitals: T: 98.3     HR: 73      BP: 171/109     RR: 12      O2 saturation: 100%  General: resting in bed, not in acute distress. HEENT: PERRL, no scleral icterus. Both nares are packed for management of epistaxis. There is no active bleeding when I saw patient. Oropharynx and oral mucosa are dry at this time.  Cardiac: S1/S2, RRR, No murmurs, gallops or rubs Pulm: Good air movement bilaterally, Clear to auscultation bilaterally, No rales, wheezing, rhonchi or rubs. Abd: Soft,  nondistended, nontender, no rebound pain, no organomegaly, BS present Ext: No rashes,  2+DP/PT pulse bilaterally. 1+ petting edema bilaterally over leg/thigh Musculoskeletal: No joint deformities, erythema, or stiffness, ROM full and no nontender Skin: no rashes. No skin bruise. Neuro: alert and oriented X3, cranial nerves II-XII grossly intact, muscle strength 5/5 in all extremeties,  sensation to light touch intact.   Lab Results: Basic Metabolic Panel:  Lab [id number] 0520 07/16/12 1321  07/16/12 0410  NA 135 137 --  K 3.4* 4.0 --  CL 100 102 --  CO2 17* 15* --  GLUCOSE 115* 116* --  BUN 126* 129* --  CREATININE 12.07* 12.00* --  CALCIUM 5.6* 5.4* --  MG -- -- --  PHOS 7.8* -- 7.9*   Magnesium:    Ref. Range 06/24/2009 05:42 10/21/2010 14:20 07/15/2012 18:06  PTH Latest Range: 14.0-72.0 pg/mL 213.7 (H) 317.3 (H) 684.9 (H)    Liver Function Tests:  Lab 07/17/12 0520 07/16/12 1321 07/15/12 1007  AST -- 14 20  ALT -- 13 17  ALKPHOS -- 49 53  BILITOT -- 0.1* 0.1*  PROT -- 5.1* 5.2*  ALBUMIN 2.4* 2.4* --   CBC:  Lab 07/17/12 0520 07/16/12 2013 07/15/12 1007  WBC 10.5 12.6* --  NEUTROABS -- -- 4.1  HGB 7.6* 7.4* --  HCT 22.0* 21.4* --  MCV 84.6 83.6 --  PLT 239 241 --   Cardiac Enzymes:  Lab 07/16/12 1320 07/16/12 0410 07/15/12 2146  CKTOTAL 520* 585* 569*  CKMB 10.7* 11.6* 10.1*  CKMBINDEX -- -- --  TROPONINI <0.30 <0.30 <0.30   BNP:  Lab 07/15/12 1008  PROBNP 31955.0*  Lab 07/17/12 0520 07/15/12 1806  LABPROT 14.4 16.9*  INR 1.10 1.35   Anemia Panel:  Lab 07/15/12 2144 07/15/12 1806  VITAMINB12 489 --  FOLATE 7.1 --  FERRITIN -- 27  TIBC -- 337  IRON -- 57  RETICCTPCT 2.6 --   Urine Drug Screen: Drugs of Abuse     Component Value Date/Time   LABOPIA NONE DETECTED 07/15/2012 2328   COCAINSCRNUR POSITIVE* 07/15/2012 2328   LABBENZ NONE DETECTED 07/15/2012 2328   AMPHETMU NONE DETECTED 07/15/2012 2328   THCU POSITIVE* 07/15/2012 2328   LABBARB NONE DETECTED 07/15/2012 2328    Alcohol Level: No results found for this basename: ETH:2 in the last 168 hours Urinalysis:  Lab 07/15/12 2328  COLORURINE YELLOW  LABSPEC 1.015  PHURINE 5.0  GLUCOSEU NEGATIVE  HGBUR NEGATIVE  BILIRUBINUR NEGATIVE  KETONESUR NEGATIVE  PROTEINUR NEGATIVE  UROBILINOGEN 0.2  NITRITE NEGATIVE  LEUKOCYTESUR NEGATIVE    Micro Results: No results found for this or any previous visit (from the past 240 hour(s)). Studies/Results: Dg Chest 2 View  07/15/2012   *RADIOLOGY REPORT*  Clinical Data: Epistaxis  CHEST - 2 VIEW  Comparison: 03/10/2011 10/20/2010  Findings: Stable moderate cardiomegaly.  Thoracic aorta contour is normal.  The lungs are clear.  Negative for edema, airspace disease, or pleural effusion.  No acute or suspicious bony abnormality.  Visualized upper abdomen is normal.  IMPRESSION: Stable moderate cardiomegaly with mild pulmonary vascular congestion.  The lungs are clear.  Original Report Authenticated By: Curlene Dolphin, M.D.   Ct Head Wo Contrast  07/15/2012  *RADIOLOGY REPORT*  Clinical Data: 46 year old male with headache and epistaxis.  CT HEAD WITHOUT CONTRAST  Technique:  Contiguous axial images were obtained from the base of the skull through the vertex without contrast.  Comparison: 07/14/2012  Findings: No intracranial abnormalities are identified, including mass lesion or mass effect, hydrocephalus, extra-axial fluid collection, midline shift, hemorrhage, or acute infarction.  The visualized bony calvarium is unremarkable. High attenuation fluid in the nasal cavity and paranasal sinuses noted compatible with blood.  IMPRESSION: No evidence of intracranial quality.  Blood in the nasal cavity and paranasal sinuses.  Original Report Authenticated By: Lura Em, M.D.   US Renal  07/15/2012  *RADIOLOGY REPORT*  Clinical Data: 46 year old male with acute on chronic renal failure.  RENAL/URINARY TRACT ULTRASOUND COMPLETE  Comparison:  10/21/2010 ultrasound  Findings:  Right Kidney:  The right kidney is small and echogenic measuring 6 cm in greatest longitudinal dimension.  There is no evidence of solid mass, hydronephrosis or definite renal calculi.  Left Kidney:  The left kidney is small and echogenic measuring 7.5 cm in greatest longitudinal dimension.  There is no evidence of hydronephrosis, solid renal mass or definite renal calculi.  Bladder:  The bladder is normal for degree of filling.  IMPRESSION: Small echogenic kidneys compatible with  chronic medical renal disease.  No evidence of hydronephrosis.  Original Report Authenticated By: Lura Em, M.D.   Medications: Scheduled Meds:    . calcium acetate  1,334 mg Oral TID WC  . calcium gluconate  1 g Intravenous Once  . carvedilol  12.5 mg Oral BID WC  . cefTRIAXone (ROCEPHIN)  IV  1 g Intravenous Q24H  . darbepoetin (ARANESP) injection - DIALYSIS  100 mcg Intravenous Q Sat-HD  . ferric gluconate (FERRLECIT/NULECIT) IV  125 mg Intravenous Q T,Th,Sa-HD  . ferric gluconate (FERRLECIT/NULECIT) IV  125 mg Intravenous Q Thu-HD  . folic acid  1 mg Oral  Daily  . furosemide  40 mg Intravenous Q12H  . isosorbide mononitrate  60 mg Oral Daily  . LORazepam  0-4 mg Oral Q6H   Followed by  . LORazepam  0-4 mg Oral Q12H  . multivitamin with minerals  1 tablet Oral Daily  . oxymetazoline  2 spray Each Nare BID  . pantoprazole (PROTONIX) IV  40 mg Intravenous Q24H  . paricalcitol  2 mcg Intravenous Q T,Th,Sa-HD  . sodium chloride  3 mL Intravenous Q12H  . thiamine  100 mg Oral Daily   Or  . thiamine  100 mg Intravenous Daily  . DISCONTD: calcium carbonate  1,000 mg of elemental calcium Oral TID   Continuous Infusions:  PRN Meds:.acetaminophen, acetaminophen, albuterol, cloNIDine, hydrALAZINE, HYDROcodone-acetaminophen, LORazepam, LORazepam, morphine injection, ondansetron (ZOFRAN) IV, ondansetron, senna-docusate Assessment/Plan:  1. NEW ESRD: most likely due to noncompliance to HTN medications. He had hypervolemic status on admission as evidenced by CXR and leg edema and SOB. He responded to Lasix treatment with good urine output yesterday, but without improvement of Cre and BUN. Renal US showed no hydronephrosis and bilaterally small shrunken kidneys.  Protein/cre ratio is slightly elevated at 0.16 (normal <0.15).  HBsAg and HBsAb are negative.  For initiation of dialysis today - plan 3 days in a row CLIP process has been started - has no Medicaid or insurance - will need  emergency Medicaid application Am told that Financial counselor to see on Monday Tunneled dialysis catheter placement today (07/17/12).  Will get Fistula placement on Wednesday (07/21/12) D/C lasix  3. Epistaxis: Probably associated with hypertension but also in part from dysfunctional platelets of advanced renal dysfunction/uremia and use of crack cocaine.  Acute management as done by ENT.  DDAVP was given (8/1). Currently no active bleeding.  2. Hypertensive urgency: bp was 171/21 on admission. Currently on Imdur, lasix and prn IV hydralazine. Today bp is 171/109. Per primary team.  4. Anemia of chronic kidney disease complicated by acute blood loss anemia from epistaxis: both iron and ferritin are at low normal range. It is likely due to iron deficiency. Vitamin B12 normal. -Start aranesp and iron with first HD (8/3) S/p transfusion 1 unit (8/2) S/p    5. 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 Hypokalemia (K 3.4): will be corrected by HD.   Ivor Costa, MD PGY2, Internal Medicine Teaching Service Pager: [phone] I have seen and examined this patient and agree with plan as outlined above with attention to highlighted additions. For initiation of dialysis today via tunneled dialysis catheter, plan 3 serial treatments; working on outpt arrangemetns; will need to get his AVF before he can be discharged - scheduled for Wednesday 8/7 Kamil Mchaffie B,MD 07/17/2012 11:35 AM

## 2012-07-17 NOTE — Op Note (Signed)
OPERATIVE NOTE  PROCEDURE: 1. right internal jugular vein vein tunneled dialysis catheter placement 2. right internal jugular vein vein cannulation under ultrasound guidance  PRE-OPERATIVE DIAGNOSIS: end-stage renal failure  POST-OPERATIVE DIAGNOSIS: same as above  SURGEON: Hinda Lenis, MD  ANESTHESIA: general  ESTIMATED BLOOD LOSS: minimal  FINDING(S): 1.  Tips of the catheter in the right atrium on fluoroscopy 2.  No obvious pneumothorax on fluoroscopy  SPECIMEN(S):  none  INDICATIONS:   [name] [name] is a 46 y.o. male who presents with end stage renal disease.  The patient presents for tunneled dialysis catheter placement.  The patient is aware the risks of tunneled dialysis catheter placement include but are not limited to: bleeding, infection, central venous injury, pneumothorax, possible venous stenosis, possible malpositioning in the venous system, and possible infections related to long-term catheter presence. The patient was aware of these risks and agreed to proceed.  DESCRIPTION: After written full informed consent was obtained from the patient, the patient was taken back to the operating room.  Prior to induction, the patient was given IV antibiotics.  After obtaining adequate sedation, the patient was prepped and draped in the standard fashion for a chest or neck tunneled dialysis catheter placement.  I anesthesized the neck cannulation site with local anesthetic, then under ultrasound guidance, the right internal jugular vein vein was cannulated with the 18 gauge needle.  A J-wire was then placed down in the inferior vena cava under fluroscopic guidance.  The wire was then secured in place with a clamp to the drapes.  I then made stab incisions are the neck and exit sites.  I dissected from the chest to the neck and dilated the subcutaneous tunnel with a plastic dilator.  The wire was then unclamped and I removed the needle.  The skin tract and venotomy was dilated  serially with dilators.  Finally, the dilator-sheath was placed under fluroscopic guidance into the superior vena cava.  The dilator and wire were removed.  A 23 cm Diatek catheter was placed under fluoroscopic guidance down into the right atrium.  The sheath was broken and peeled away while holding the catheter cuff at the level of the skin.  The back end of this catheter was transected, revealing the two lumens of this catheter.  The ports were docked onto these two lumens.  The catheter hub was then screwed into place.  Each port was tested by aspirating and flushing.  No resistance was noted.  Each port was then thoroughly flushed with heparinized saline.  The catheter was secured in placed with two interrupted stitches of 3-0 Nylon tied to the catheter.  The neck incision was closed with a U-stitch of 4-0 Monocryl.  The neck and chest incision were cleaned and sterile bandages applied.  Each port was then loaded with concentrated heparin (1000 Units/mL) at the manufacturer recommended volumes to each port.  Sterile caps were applied to each port.  On completion fluoroscopy, the tips of the catheter were in the right atrium, and there was no evidence of pneumothorax.  COMPLICATIONS: none  CONDITION: stable   Adele Barthel, MD [facility] [place] Office: [phone] Pager: [phone]  07/17/2012, 8:34 AM

## 2012-07-17 NOTE — Anesthesia Postprocedure Evaluation (Signed)
  Anesthesia Post-op Note  Patient: [name] [name]  Procedure(s) Performed: Procedure(s) (LRB): INSERTION OF DIALYSIS CATHETER (Right)  Patient Location: PACU  Anesthesia Type: General  Level of Consciousness: awake, oriented, sedated and patient cooperative  Airway and Oxygen Therapy: Patient Spontanous Breathing and Patient connected to nasal cannula oxygen  Post-op Pain: none  Post-op Assessment: Post-op Vital signs reviewed, Patient's Cardiovascular Status Stable, Respiratory Function Stable, Patent Airway, No signs of Nausea or vomiting and Pain level controlled  Post-op Vital Signs: stable  Complications: No apparent anesthesia complications

## 2012-07-17 NOTE — H&P (Addendum)
[facility]  History and Physical Update  The patient was interviewed and re-examined.  The patient's previous History and Physical has been reviewed and is unchanged except from my consult on 07/16/12.  There is no change in the plan of care: tunneled dialysis catheter placement today.  Fistula placement on Wednesday assuming resolution of metabolic disturbances after commencement of hemodialysis.  Laboratory: CBC:    Component Value Date/Time   WBC 10.5 07/17/2012 0520   RBC 2.60* 07/17/2012 0520   HGB 7.6* 07/17/2012 0520   HCT 22.0* 07/17/2012 0520   PLT 239 07/17/2012 0520   MCV 84.6 07/17/2012 0520   MCH 29.2 07/17/2012 0520   MCHC 34.5 07/17/2012 0520   RDW 14.3 07/17/2012 0520   LYMPHSABS 1.1 07/15/2012 1007   MONOABS 0.7 07/15/2012 1007   EOSABS 0.5 07/15/2012 1007   BASOSABS 0.1 07/15/2012 1007    BMP:    Component Value Date/Time   NA 135 07/17/2012 0520   K 3.4* 07/17/2012 0520   CL 100 07/17/2012 0520   CO2 17* 07/17/2012 0520   GLUCOSE 115* 07/17/2012 0520   BUN 126* 07/17/2012 0520   CREATININE 12.07* 07/17/2012 0520   CALCIUM 5.6* 07/17/2012 0520   CALCIUM 8.2* 10/21/2010 1420   GFRNONAA 4* 07/17/2012 0520   GFRAA 5* 07/17/2012 0520    Coagulation: Lab Results  Component Value Date   INR 1.10 07/17/2012   INR 1.35 07/15/2012   INR 1.00 10/20/2010   No results found for this basename: PTT      Adele Barthel, MD [facility] Office: [phone] Pager: [phone]  07/17/2012, 7:15 AM

## 2012-07-17 NOTE — Progress Notes (Signed)
Both ports of new catheter flushed frequently and system flushed also.  Venous pressure extremely high.  Lines reversed at times.  Unable to keep machine running due to pressures.  New system set up X1 as venous chamber began clotting.  Only able to keep that system going 13 minutes.  All of patient's blood returned then and Dr Jonnie Finner notified.

## 2012-07-17 NOTE — Progress Notes (Signed)
Pt had 18 beats run of V-tach. Pt was asymptomatic, and VS stable (see VS flow sheet). PA/NP notified; no new order received. Will continue to monitor.

## 2012-07-18 ENCOUNTER — Inpatient Hospital Stay (HOSPITAL_COMMUNITY): Payer: Medicaid Other

## 2012-07-18 DIAGNOSIS — N184 Chronic kidney disease, stage 4 (severe): Secondary | ICD-10-CM

## 2012-07-18 LAB — PREPARE FRESH FROZEN PLASMA: Unit division: 0

## 2012-07-18 LAB — RENAL FUNCTION PANEL
CO2: 18 mEq/L — ABNORMAL LOW (ref 19–32)
Calcium: 6.5 mg/dL — ABNORMAL LOW (ref 8.4–10.5)
Creatinine, Ser: 10.86 mg/dL — ABNORMAL HIGH (ref 0.50–1.35)
Glucose, Bld: 102 mg/dL — ABNORMAL HIGH (ref 70–99)

## 2012-07-18 MED ORDER — AMLODIPINE BESYLATE 5 MG PO TABS
5.0000 mg | ORAL_TABLET | Freq: Every day | ORAL | Status: DC
  Administered 2012-07-18: 5 mg via ORAL
  Filled 2012-07-18 (×2): qty 1

## 2012-07-18 MED ORDER — HYDROCODONE-ACETAMINOPHEN 5-325 MG PO TABS
ORAL_TABLET | ORAL | Status: AC
  Administered 2012-07-18: 2 via ORAL
  Filled 2012-07-18: qty 2

## 2012-07-18 MED ORDER — DARBEPOETIN ALFA-POLYSORBATE 100 MCG/0.5ML IJ SOLN
INTRAMUSCULAR | Status: AC
  Filled 2012-07-18: qty 0.5

## 2012-07-18 MED ORDER — ALTEPLASE 2 MG IJ SOLR
2.0000 mg | Freq: Once | INTRAMUSCULAR | Status: AC
  Administered 2012-07-18: 2 mg
  Filled 2012-07-18: qty 2

## 2012-07-18 MED ORDER — PANTOPRAZOLE SODIUM 40 MG PO TBEC
40.0000 mg | DELAYED_RELEASE_TABLET | Freq: Every day | ORAL | Status: DC
  Administered 2012-07-18 – 2012-07-22 (×4): 40 mg via ORAL
  Filled 2012-07-18 (×5): qty 1

## 2012-07-18 NOTE — Progress Notes (Signed)
Subjective: The patient had a R IJ HD catheter placed yesterday, with some fatigue after his first HD session yesterday.  Patient notes continued urine output.  He notes that he believes he is continuing to have small-volume leakage of blood from nares, around nasal packing, and also wonders when he can have the packing removed.  Overnight, the patient experienced an 18-beat run of symptomatic Vtach.  His BP remains elevated.  Plan for fistula placement 07/21/12 by verbal report - need to clarify with Dr. Bridgett Larsson. Vein mapping has been ordered  Objective: Vital signs in last 24 hours: Filed Vitals:   07/18/12 0514 07/18/12 0700 07/18/12 0910 07/18/12 0950  BP: 166/117 165/93 109/58 168/112  Pulse: 72 67 93 73  Temp: 97.9 F (36.6 C)  98.5 F (36.9 C) 97 F (36.1 C)  TempSrc: Oral  Oral Oral  Resp: 18  18 18   Height:      Weight:    160 lb 15 oz (73 kg)  SpO2: 100%  91% 96%   Weight change: -3 lb 12 oz (-1.7 kg)  Intake/Output Summary (Last 24 hours) at 07/18/12 1030 Last data filed at 07/18/12 0900  Gross per 24 hour  Intake    290 ml  Output    800 ml  Net   -510 ml   Urine 1250 (8/2); 2900 (8/3); 1400 (8/4)  Body weight: 07/18/12 0950 160 lbs 15 oz (73 kg) pre-dialysis 07/17/12 2304 162 lbs 0.6 oz (73.5 kg) post-dialysis 07/17/12 0416 147 lb 11.3 oz    (67.0  kg) 07/15/12 0942 160 lb                 (72.6 kg)  General: lying in bed, resting, NAD HEENT: pupils equal round and reactive to light, vision grossly intact, oropharynx clear and non-erythematous, minimal crusted blood noted below nares, nasal packing in place Neck: supple, no lymphadenopathy Lungs: clear to ascultation bilaterally, normal work of respiration, no wheezes, rales, ronchi Heart: regular rate and rhythm, no murmurs, gallops, or rubs Abdomen: soft, non-tender, non-distended, normal bowel sounds Extremities: 2+ bilateral pitting edema Neurologic: alert & oriented X3, cranial nerves II-XII intact, strength  grossly intact, sensation intact to light touch Right sided tunneled dialysis catheter in place.   Lab Results: Basic Metabolic Panel:  Lab [id number] 07/17/12 0520  NA 140 135  K 4.3 3.4*  CL 105 100  CO2 18* 17*  GLUCOSE 102* 115*  BUN 101* 126*  CREATININE 10.86* 12.07*  CALCIUM 6.5* 5.6*  MG -- --  PHOS 6.8* 7.8*   Magnesium:    Ref. Range 06/24/2009 05:42 10/21/2010 14:20 07/15/2012 18:06  PTH Latest Range: 14.0-72.0 pg/mL 213.7 (H) 317.3 (H) 684.9 (H)    Liver Function Tests:  Lab 07/18/12 0756 07/17/12 0520 07/16/12 1321 07/15/12 1007  AST -- -- 14 20  ALT -- -- 13 17  ALKPHOS -- -- 49 53  BILITOT -- -- 0.1* 0.1*  PROT -- -- 5.1* 5.2*  ALBUMIN 2.3* 2.4* -- --   CBC:  Lab 07/17/12 0520 07/16/12 2013 07/15/12 1007  WBC 10.5 12.6* --  NEUTROABS -- -- 4.1  HGB 7.6* 7.4* --  HCT 22.0* 21.4* --  MCV 84.6 83.6 --  PLT 239 241 --   Cardiac Enzymes:  Lab 07/16/12 1320 07/16/12 0410 07/15/12 2146  CKTOTAL 520* 585* 569*  CKMB 10.7* 11.6* 10.1*  CKMBINDEX -- -- --  TROPONINI <0.30 <0.30 <0.30   BNP:  Lab 07/15/12 1008  PROBNP 31955.0*  Lab 07/17/12 0520 07/15/12 1806  LABPROT 14.4 16.9*  INR 1.10 1.35   Anemia Panel:  Lab 07/15/12 2144 07/15/12 1806  VITAMINB12 489 --  FOLATE 7.1 --  FERRITIN -- 27  TIBC -- 337  IRON -- 57  RETICCTPCT 2.6 --   Urine Drug Screen: Drugs of Abuse     Component Value Date/Time   LABOPIA NONE DETECTED 07/15/2012 2328   COCAINSCRNUR POSITIVE* 07/15/2012 2328   LABBENZ NONE DETECTED 07/15/2012 2328   AMPHETMU NONE DETECTED 07/15/2012 2328   THCU POSITIVE* 07/15/2012 2328   LABBARB NONE DETECTED 07/15/2012 2328    Urinalysis:  Lab 07/15/12 2328  COLORURINE YELLOW  LABSPEC 1.015  PHURINE 5.0  GLUCOSEU NEGATIVE  HGBUR NEGATIVE  BILIRUBINUR NEGATIVE  KETONESUR NEGATIVE  PROTEINUR NEGATIVE  UROBILINOGEN 0.2  NITRITE NEGATIVE  LEUKOCYTESUR NEGATIVE    Studies/Results: Dg Chest 2 View  07/15/2012  *RADIOLOGY  REPORT*  Clinical Data: Epistaxis  CHEST - 2 VIEW  Comparison: 03/10/2011 10/20/2010  Findings: Stable moderate cardiomegaly.  Thoracic aorta contour is normal.  The lungs are clear.  Negative for edema, airspace disease, or pleural effusion.  No acute or suspicious bony abnormality.  Visualized upper abdomen is normal.  IMPRESSION: Stable moderate cardiomegaly with mild pulmonary vascular congestion.  The lungs are clear.  Original Report Authenticated By: Curlene Dolphin, M.D.   Ct Head Wo Contrast  07/15/2012  *RADIOLOGY REPORT*  Clinical Data: 46 year old male with headache and epistaxis.  CT HEAD WITHOUT CONTRAST  Technique:  Contiguous axial images were obtained from the base of the skull through the vertex without contrast.  Comparison: 07/14/2012  Findings: No intracranial abnormalities are identified, including mass lesion or mass effect, hydrocephalus, extra-axial fluid collection, midline shift, hemorrhage, or acute infarction.  The visualized bony calvarium is unremarkable. High attenuation fluid in the nasal cavity and paranasal sinuses noted compatible with blood.  IMPRESSION: No evidence of intracranial quality.  Blood in the nasal cavity and paranasal sinuses.  Original Report Authenticated By: Lura Em, M.D.   US Renal  07/15/2012  *RADIOLOGY REPORT*  Clinical Data: 46 year old male with acute on chronic renal failure.  RENAL/URINARY TRACT ULTRASOUND COMPLETE  Comparison:  10/21/2010 ultrasound  Findings:  Right Kidney:  The right kidney is small and echogenic measuring 6 cm in greatest longitudinal dimension.  There is no evidence of solid mass, hydronephrosis or definite renal calculi.  Left Kidney:  The left kidney is small and echogenic measuring 7.5 cm in greatest longitudinal dimension.  There is no evidence of hydronephrosis, solid renal mass or definite renal calculi.  Bladder:  The bladder is normal for degree of filling.  IMPRESSION: Small echogenic kidneys compatible with chronic  medical renal disease.  No evidence of hydronephrosis.  Original Report Authenticated By: Lura Em, M.D.   Medications: Scheduled Meds:    . calcium acetate  1,334 mg Oral TID WC  . calcium gluconate  1 g Intravenous Once  . carvedilol  12.5 mg Oral BID WC  . cephALEXin  500 mg Oral Q12H  . darbepoetin (ARANESP) injection - DIALYSIS  100 mcg Intravenous Q Sat-HD  . ferric gluconate (FERRLECIT/NULECIT) IV  125 mg Intravenous Q T,Th,Sa-HD  . ferric gluconate (FERRLECIT/NULECIT) IV  125 mg Intravenous Q Thu-HD  . folic acid  1 mg Oral Daily  . isosorbide mononitrate  60 mg Oral Daily  . LORazepam  0-4 mg Oral Q6H   Followed by  . LORazepam  0-4 mg Oral Q12H  . multivitamin  with minerals  1 tablet Oral Daily  . oxymetazoline  2 spray Each Nare BID  . pantoprazole (PROTONIX) IV  40 mg Intravenous Q24H  . paricalcitol  2 mcg Intravenous Q T,Th,Sa-HD  . potassium chloride  40 mEq Oral Once  . sodium chloride  3 mL Intravenous Q12H  . thiamine  100 mg Oral Daily   Or  . thiamine  100 mg Intravenous Daily  . DISCONTD: cefTRIAXone (ROCEPHIN)  IV  1 g Intravenous Q24H  . DISCONTD: furosemide  40 mg Intravenous Q12H   Continuous Infusions:  PRN Meds:.acetaminophen, acetaminophen, albuterol, cloNIDine, hydrALAZINE, HYDROcodone-acetaminophen, LORazepam, LORazepam, morphine injection, ondansetron (ZOFRAN) IV, ondansetron, senna-docusate, DISCONTD:  HYDROmorphone (DILAUDID) injection, DISCONTD: ondansetron (ZOFRAN) IV  Assessment/Plan:  1. ESRD: HTN (poor medication compliance and cocaine use). He had hypervolemic status on admission as evidenced by CXR and leg edema and SOB, which has improved but still persists. Renal US showed no hydronephrosis and bilaterally small shrunken kidneys. Protein/cre ratio is slightly elevated at 0.16 (normal <0.15). HBsAg and HBsAb are negative. -s/p R IJ HD catheter 8/3, plan for AV fistula placement 8/7 -s/p HD 8/3, plan to repeat today (8/4) and  8/5 -CLIP process has been started - has no Medicaid or insurance - will need emergency Medicaid application -Pt to see Financial counselor on Monday -lasix has been discontinued  2. Epistaxis: Probably associated with hypertension but also in part from dysfunctional platelets of advanced renal dysfunction/uremia and use of crack cocaine. -s/p nasal packing and bilateral sphenopalatine artery cauterization by ENT 8/1. -per ENT, nasal packs should stay in place for 7 days (do not remove before 8/8) -on keflex while nasal packs in place -DDAVP was given (8/1). Patient believes he may still be having slow bleeding from nostrils  3. Hypertensive urgency: bp was 171/21 on admission. Currently on Imdur, amlodipine, carvedilol, and prn IV hydralazine.  -Per primary team.  4. Anemia of chronic kidney disease complicated by acute blood loss anemia from epistaxis: both iron and ferritin are at low normal range. It is likely due to iron deficiency. Vitamin B12 normal. 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 [phone] 07/18/2012, 10:48 AM I have seen and examined this patient and agree with plan as outlined in note of Elnora Morrison, MD. Patient with new ESRD secondary to hypertension.  3rd HD tomorrow.  Financial application and Medicaid application to be started tomorrow.  CLIP process started and outpt spot will be contingent on Medicaid.  Have started iron, phosphorus binders, zemplar.  Using no heparin with HD in face of the nosebleed/nasal packing.  Will clarify with Dr. Bridgett Larsson that AVF is in fact to be done this week. Ronnie Mallette B,MD 07/18/2012 11:32 AM

## 2012-07-18 NOTE — Procedures (Signed)
I have personally attended this patient's dialysis session.  Permcath running at 255 with AP -70, VP 60, TMP 90.  Goal is 4200 and thus far BP is high (DBP 112).  No heparin due to nosebleeds. This is patient's second HD treatment (1st treatment abbreviated yesterday due to clotting) 3 hours planned Plan 3rd tmt tomorrow.   Cagney Steenson B

## 2012-07-18 NOTE — Progress Notes (Signed)
Patient ID: [name] [name]  male  [id number]    DOB: 1966/11/19    DOA: 07/15/2012  PCP: Pcp Not In System  Brief narrative:  HPI: This is a 46 year old, African American male, with a past medical history of Hypertension, chronic kidney disease, who is noncompliant, who was in his usual state of health when he woke up at around 5 or 6:00 and felt that his nose was bleeding. Initially, the bleeding seemed to occur from his left nostril, subsequently, both his nostrils started bleeding. The bleeding would not stop despite pressure and, so, he decided to come in to the hospital. He denies picking his nose or blowing his nose recently. Denies being on any anticoagulants. He's had a few episodes of epistaxis in the past, but hasn't required any surgical intervention.  He mentioned that for the last 2-3 weeks he's noticed that his ankles have been swollen. He hasn't noticed any decrease in the amount of urination. Denies any blood in the urine. Has been short of breath. However, denies any orthopnea. Shortness of breath is mainly with exertion. He mentioned that he's lost about 25 pounds in the last one year. Denies any blood in the stools or black colored stools. He does have known kidney disease, however, does not follow any nephrologist. Review of his chart reveals that he has been offered dialysis multiple times in the past. However, patient has declined. He denies any chest pain at this time. Denies any cough. No sick contacts.   Consultants:  Nephrology  Vascular  ENT  Procedures:  endoscopic ligation of bilateral sphenopalatine arteries 07/15/12  Antibiotics:  Rocephin 8/1>>   Subjective: C/o headache, otherwise no specific complaints   Objective: Weight change: -1.7 kg (-3 lb 12 oz)  Intake/Output Summary (Last 24 hours) at 07/18/12 1028 Last data filed at 07/18/12 0900  Gross per 24 hour  Intake    290 ml  Output    800 ml  Net   -510 ml   Blood pressure 109/58, pulse 93,  temperature 98.5 F (36.9 C), temperature source Oral, resp. rate 18, height 5\' 9"  (1.753 m), weight 73.5 kg (162 lb 0.6 oz), SpO2 91.00%.  Physical Exam: General: Alert and awake, oriented x3, not in any acute distress. HEENT: anicteric sclera, pupils reactive to light and accommodation, EOMI, nasal packing+ CVS: S1-S2 clear, no murmur rubs or gallops Chest: clear to auscultation bilaterally, no wheezing, rales or rhonchi Abdomen: soft nontender, nondistended, normal bowel sounds, no organomegaly Extremities: no cyanosis, clubbing. 2+ edema noted bilaterally Neuro: Cranial nerves II-XII intact, no focal neurological deficits  Lab Results: Basic Metabolic Panel:  Lab [id number] 07/17/12 0520  NA 140 135  K 4.3 3.4*  CL 105 100  CO2 18* 17*  GLUCOSE 102* 115*  BUN 101* 126*  CREATININE 10.86* 12.07*  CALCIUM 6.5* 5.6*  MG -- --  PHOS 6.8* --   Liver Function Tests:  Lab 07/18/12 0756 07/17/12 0520 07/16/12 1321 07/15/12 1007  AST -- -- 14 20  ALT -- -- 13 17  ALKPHOS -- -- 49 53  BILITOT -- -- 0.1* 0.1*  PROT -- -- 5.1* 5.2*  ALBUMIN 2.3* 2.4* -- --   CBC:  Lab 07/17/12 0520 07/16/12 2013 07/15/12 1007  WBC 10.5 12.6* --  NEUTROABS -- -- 4.1  HGB 7.6* 7.4* --  HCT 22.0* 21.4* --  MCV 84.6 83.6 --  PLT 239 241 --   Cardiac Enzymes:  Lab 07/16/12 1320 07/16/12 0410 07/15/12 2146  CKTOTAL  520* 585* 569*  CKMB 10.7* 11.6* 10.1*  CKMBINDEX -- -- --  TROPONINI <0.30 <0.30 <0.30    Studies/Results: Dg Chest 2 View  07/15/2012  *RADIOLOGY REPORT*  Clinical Data: Epistaxis  CHEST - 2 VIEW  Comparison: 03/10/2011 10/20/2010  Findings: Stable moderate cardiomegaly.  Thoracic aorta contour is normal.  The lungs are clear.  Negative for edema, airspace disease, or pleural effusion.  No acute or suspicious bony abnormality.  Visualized upper abdomen is normal.  IMPRESSION: Stable moderate cardiomegaly with mild pulmonary vascular congestion.  The lungs are clear.   Original Report Authenticated By: Curlene Dolphin, M.D.   Ct Head Wo Contrast  07/15/2012  *RADIOLOGY REPORT*  Clinical Data: 46 year old male with headache and epistaxis.  CT HEAD WITHOUT CONTRAST  Technique:  Contiguous axial images were obtained from the base of the skull through the vertex without contrast.  Comparison: 07/14/2012  Findings: No intracranial abnormalities are identified, including mass lesion or mass effect, hydrocephalus, extra-axial fluid collection, midline shift, hemorrhage, or acute infarction.  The visualized bony calvarium is unremarkable. High attenuation fluid in the nasal cavity and paranasal sinuses noted compatible with blood.  IMPRESSION: No evidence of intracranial quality.  Blood in the nasal cavity and paranasal sinuses.  Original Report Authenticated By: Lura Em, M.D.   US Renal  07/15/2012  *RADIOLOGY REPORT*  Clinical Data: 46 year old male with acute on chronic renal failure.  RENAL/URINARY TRACT ULTRASOUND COMPLETE  Comparison:  10/21/2010 ultrasound  Findings:  Right Kidney:  The right kidney is small and echogenic measuring 6 cm in greatest longitudinal dimension.  There is no evidence of solid mass, hydronephrosis or definite renal calculi.  Left Kidney:  The left kidney is small and echogenic measuring 7.5 cm in greatest longitudinal dimension.  There is no evidence of hydronephrosis, solid renal mass or definite renal calculi.  Bladder:  The bladder is normal for degree of filling.  IMPRESSION: Small echogenic kidneys compatible with chronic medical renal disease.  No evidence of hydronephrosis.  Original Report Authenticated By: Lura Em, M.D.   Dg Chest Port 1 View  07/17/2012  *RADIOLOGY REPORT*  Clinical Data: Tunneled dialysis catheter placement.  Rule out pneumothorax.  PORTABLE CHEST - 1 VIEW  Comparison: 07/15/2012.  Findings: There is a new right internal jugular PermCath with tips terminating in the right atrium and at the superior cavoatrial  junction.  Lung volumes are normal.  No consolidative airspace disease.  No pleural effusions.  No pneumothorax.  Mild pulmonary venous congestion without frank pulmonary edema.  Heart size is mildly enlarged.  Mediastinal contours are unremarkable.  IMPRESSION: 1.  Right internal jugular PermCath in proper position, as above. No associated pneumothorax. 2.  Cardiomegaly with pulmonary venous congestion but no frank pulmonary edema.  Original Report Authenticated By: Etheleen Mayhew, M.D.    Medications: Scheduled Meds:   . calcium acetate  1,334 mg Oral TID WC  . calcium gluconate  1 g Intravenous Once  . carvedilol  12.5 mg Oral BID WC  . cephALEXin  500 mg Oral Q12H  . darbepoetin (ARANESP) injection - DIALYSIS  100 mcg Intravenous Q Sat-HD  . ferric gluconate (FERRLECIT/NULECIT) IV  125 mg Intravenous Q T,Th,Sa-HD  . ferric gluconate (FERRLECIT/NULECIT) IV  125 mg Intravenous Q Thu-HD  . folic acid  1 mg Oral Daily  . isosorbide mononitrate  60 mg Oral Daily  . LORazepam  0-4 mg Oral Q6H   Followed by  . LORazepam  0-4 mg Oral  Q12H  . multivitamin with minerals  1 tablet Oral Daily  . oxymetazoline  2 spray Each Nare BID  . pantoprazole (PROTONIX) IV  40 mg Intravenous Q24H  . paricalcitol  2 mcg Intravenous Q T,Th,Sa-HD  . potassium chloride  40 mEq Oral Once  . sodium chloride  3 mL Intravenous Q12H  . thiamine  100 mg Oral Daily   Or  . thiamine  100 mg Intravenous Daily  . DISCONTD: cefTRIAXone (ROCEPHIN)  IV  1 g Intravenous Q24H  . DISCONTD: furosemide  40 mg Intravenous Q12H   Continuous Infusions:    Assessment/Plan: Principal Problem:  *Acute on chronic renal failure/New ESRD: likely progression of his chronic kidney disease due to non-compliance with anti-hypertensive regimen - Renal service following, renal ultrasound showed no hydronephrosis and bilateral small shrunken kidneys. - Hemodialysis initiated per renal recommendations x 3 days in row - Fistula  placement on 07/21/2012  Epistaxis: Likely secondary to hypertension, advanced renal dysfunction/uremia and use of crack cocaine - Acute management on by ENT, nasal packing and cauterization - DDAVP was given 07/15/2012, currently no active bleeding  Accelerated hypertension: Not well controlled - Continue beta blocker, Imdur, Norvasc added  Acute blood loss anemia secondary to epistaxis with anemia of chronic kidney disease - Status post transfusion 1 unit on 8/2, started on Aranesp, iron   History of ALCOHOL ABUSE/ TOBACCO ABUSE/ COCAINE ABUSE - Urine drug screen was positive for cocaine and marijuana, currently on Ativan withdrawal protocol  Mildly Elevated troponin: Probably secondary to renal failure, further troponins negative, asymptomatic  Chronic CHF: Systolic, current echo on 07/16/2012 showed EF of [id number], grade 2 diastolic dysfunction  DVT Prophylaxis: SCDs  Code Status: Full  Disposition: Not medically ready   LOS: 3 days   RAI,RIPUDEEP M.D. [facility] 07/18/2012, 10:28 AM Pager: [phone]  If 7PM-7AM, please contact night-coverage [email or web address] Password [email or web address]

## 2012-07-18 NOTE — Progress Notes (Signed)
[facility] PROGRESS NOTE  [name] [name] [id number] DOB: 12-24-1965 DOA: 07/15/2012 PCP: Pcp Not In System  Assessment/Plan: #1 acute and chronic renal failure with metabolic acidosis and hypocalcemia: This is likely progression of his chronic kidney disease due to non-compliance with anti-hypertensive regimen. He has agreed to hemodialysis during this hospitalization- Nephrology is following- Defer further management of his ARF, hypocalcemia and his metabolic acidosis to them.  He was being dialyzed when evaluated.     #2 epistaxis: He is been seen by ENT and has had a cauterization. Coagulation panel WNL.  #3 accelerated hypertension: He'll be placed on his home dose of Coreg and Imdur. Hydralazine intravenously will be utilized as needed. Added Norvasc.   #4 Mildly elevated troponin: Probably a result of his renal failure. He denies any chest pain.  Further troponins negative.   #5 anemia likely secondary to acute blood loss: He is to receive 1 unit PRBC today as ordered by nephrology.   #6 history of chronic systolic heart failure with a known EF of 30% (based on ECHO from 2011): Current ECHO also reveals Gr 2 diastolic dysfunction  He does have significant lower extremity edema. However, no evidence for pulmonary edema at this time. History of heart failure, along with end-stage renal disease is probably contributing to the pedal edema. .   #7 history of polysubstance abuse including nicotine, alcohol, as well as cocaine: A urine drug screen is positive for Cocaine and Marijauna. He'll be placed on the Ativan withdrawal protocol. Thiamine will be provided.   #8 Headache: Likely related to epistaxis but we will proceed with Ct of head.   #9 chronic systolic CHF with wall motion abnormality: will need cardiology workup when stable. Ischemia may have been secondary to cocaine use. Will check lipid profile.    Code Status: full code Disposition Plan: follow in SDU until  dialysis is started.    Brief narrative: HPI: This is a 46 year old, African American male, with a past medical history of Hypertension, chronic kidney disease, who is noncompliant, who was in his usual state of health when he woke up at around 5 or 6:00 and felt that his nose was bleeding. Initially, the bleeding seemed to occur from his left nostril, subsequently, both his nostrils started bleeding. The bleeding would not stop despite pressure and, so, he decided to come in to the hospital. He denies picking his nose or blowing his nose recently. Denies being on any anticoagulants. He's had a few episodes of epistaxis in the past, but hasn't required any surgical intervention. He mentioned that for the last 2-3 weeks he's noticed that his ankles have been swollen. He hasn't noticed any decrease in the amount of urination. Denies any blood in the urine. Has been short of breath. However, denies any orthopnea. Shortness of breath is mainly with exertion. He mentioned that he's lost about 25 pounds in the last one year. Denies any blood in the stools or black colored stools. He does have known kidney disease, however, does not follow any nephrologist. Review of his chart reveals that he has been offered dialysis multiple times in the past. However, patient has declined. He denies any chest pain at this time. Denies any cough. No sick contacts.    Consultants:  Nephrology  Vascular  ENT  Procedures:  endoscopic ligation of bilateral sphenopalatine arteries 07/15/12  Antibiotics:  Rocephin 8/1>>  HPI/Subjective: Evaluated on dialysis- c/o pain at HD site.   Objective: Filed Vitals:   07/17/12 1800 07/17/12 2030  07/17/12 2100 07/17/12 2304  BP: 150/90 153/99 155/100 169/114  Pulse: 70 72 72 69  Temp:  98.2 F (36.8 C)  97.9 F (36.6 C)  TempSrc:  Oral  Oral  Resp: 16 10 12 16   Height:      Weight:    73.5 kg (162 lb 0.6 oz)  SpO2:  100% 100% 100%    Intake/Output Summary (Last 24  hours) at 07/18/12 0015 Last data filed at 07/17/12 2159  Gross per 24 hour  Intake    690 ml  Output   2400 ml  Net  -1710 ml    Exam:   General:  No distress, alert and sitting up in bed  Cardiovascular: RRR. No murmurs  Respiratory: CTA b/l  Abdomen: soft/ nt/ nd/ bs+  Ext: no c/c- 2+ pedal edema  Data Reviewed: Basic Metabolic Panel:  Lab [id number] 0520 07/16/12 1321 07/16/12 0410 07/15/12 1806 07/15/12 1007  NA 135 137 137 -- 135  K 3.4* 4.0 4.1 -- 3.9  CL 100 102 101 -- 100  CO2 17* 15* 15* -- 13*  GLUCOSE 115* 116* 128* -- 134*  BUN 126* 129* 126* -- 107*  CREATININE 12.07* 12.00* 11.93* -- 12.29*  CALCIUM 5.6* 5.4* 5.0* -- 4.8*  MG -- -- -- -- --  PHOS 7.8* -- 7.9* 9.2* --   Liver Function Tests:  Lab 07/17/12 0520 07/16/12 1321 07/16/12 0410 07/15/12 1007  AST -- 14 -- 20  ALT -- 13 -- 17  ALKPHOS -- 49 -- 53  BILITOT -- 0.1* -- 0.1*  PROT -- 5.1* -- 5.2*  ALBUMIN 2.4* 2.4* 2.4* 2.3*   No results found for this basename: LIPASE:5,AMYLASE:5 in the last 168 hours No results found for this basename: AMMONIA:5 in the last 168 hours CBC:  Lab 07/17/12 0520 07/16/12 2013 07/16/12 1321 07/16/12 0410 07/15/12 2144 07/15/12 1007  WBC 10.5 12.6* 10.7* 10.9* 11.0* --  NEUTROABS -- -- -- -- -- 4.1  HGB 7.6* 7.4* 6.9* 7.6* 8.3* --  HCT 22.0* 21.4* 19.9* 22.4* 24.6* --  MCV 84.6 83.6 84.7 84.2 86.0 --  PLT 239 241 265 332 335 --   Cardiac Enzymes:  Lab 07/16/12 1320 07/16/12 0410 07/15/12 2146  CKTOTAL 520* 585* 569*  CKMB 10.7* 11.6* 10.1*  CKMBINDEX -- -- --  TROPONINI <0.30 <0.30 <0.30   BNP (last 3 results)  Basename 07/15/12 1008  PROBNP 31955.0*   CBG: No results found for this basename: GLUCAP:5 in the last 168 hours  No results found for this or any previous visit (from the past 240 hour(s)).   Studies: Dg Chest 2 View  07/15/2012  *RADIOLOGY REPORT*  Clinical Data: Epistaxis  CHEST - 2 VIEW  Comparison: 03/10/2011 10/20/2010  Findings:  Stable moderate cardiomegaly.  Thoracic aorta contour is normal.  The lungs are clear.  Negative for edema, airspace disease, or pleural effusion.  No acute or suspicious bony abnormality.  Visualized upper abdomen is normal.  IMPRESSION: Stable moderate cardiomegaly with mild pulmonary vascular congestion.  The lungs are clear.  Original Report Authenticated By: Curlene Dolphin, M.D.   Ct Head Wo Contrast  07/15/2012  *RADIOLOGY REPORT*  Clinical Data: 46 year old male with headache and epistaxis.  CT HEAD WITHOUT CONTRAST  Technique:  Contiguous axial images were obtained from the base of the skull through the vertex without contrast.  Comparison: 07/14/2012  Findings: No intracranial abnormalities are identified, including mass lesion or mass effect, hydrocephalus, extra-axial fluid collection, midline shift, hemorrhage, or  acute infarction.  The visualized bony calvarium is unremarkable. High attenuation fluid in the nasal cavity and paranasal sinuses noted compatible with blood.  IMPRESSION: No evidence of intracranial quality.  Blood in the nasal cavity and paranasal sinuses.  Original Report Authenticated By: Lura Em, M.D.   US Renal  07/15/2012  *RADIOLOGY REPORT*  Clinical Data: 46 year old male with acute on chronic renal failure.  RENAL/URINARY TRACT ULTRASOUND COMPLETE  Comparison:  10/21/2010 ultrasound  Findings:  Right Kidney:  The right kidney is small and echogenic measuring 6 cm in greatest longitudinal dimension.  There is no evidence of solid mass, hydronephrosis or definite renal calculi.  Left Kidney:  The left kidney is small and echogenic measuring 7.5 cm in greatest longitudinal dimension.  There is no evidence of hydronephrosis, solid renal mass or definite renal calculi.  Bladder:  The bladder is normal for degree of filling.  IMPRESSION: Small echogenic kidneys compatible with chronic medical renal disease.  No evidence of hydronephrosis.  Original Report Authenticated By: Lura Em, M.D.    Scheduled Meds:    . calcium acetate  1,334 mg Oral TID WC  . calcium gluconate  1 g Intravenous Once  . carvedilol  12.5 mg Oral BID WC  . cephALEXin  500 mg Oral Q12H  . darbepoetin (ARANESP) injection - DIALYSIS  100 mcg Intravenous Q Sat-HD  . ferric gluconate (FERRLECIT/NULECIT) IV  125 mg Intravenous Q T,Th,Sa-HD  . ferric gluconate (FERRLECIT/NULECIT) IV  125 mg Intravenous Q Thu-HD  . folic acid  1 mg Oral Daily  . isosorbide mononitrate  60 mg Oral Daily  . LORazepam  0-4 mg Oral Q6H   Followed by  . LORazepam  0-4 mg Oral Q12H  . multivitamin with minerals  1 tablet Oral Daily  . oxymetazoline  2 spray Each Nare BID  . pantoprazole (PROTONIX) IV  40 mg Intravenous Q24H  . paricalcitol  2 mcg Intravenous Q T,Th,Sa-HD  . potassium chloride  40 mEq Oral Once  . sodium chloride  3 mL Intravenous Q12H  . thiamine  100 mg Oral Daily   Or  . thiamine  100 mg Intravenous Daily  . DISCONTD: cefTRIAXone (ROCEPHIN)  IV  1 g Intravenous Q24H  . DISCONTD: furosemide  40 mg Intravenous Q12H   Continuous Infusions:   ________________________________________________________________________  Time spent: 35 min    [facility] Pager [phone] If 8PM-8AM, please contact night-coverage at [email or web address], password [facility] 07/18/2012, 12:15 AM  LOS: 3 days

## 2012-07-19 ENCOUNTER — Encounter (HOSPITAL_COMMUNITY): Payer: Self-pay | Admitting: Vascular Surgery

## 2012-07-19 ENCOUNTER — Inpatient Hospital Stay (HOSPITAL_COMMUNITY): Payer: Medicaid Other

## 2012-07-19 DIAGNOSIS — R7989 Other specified abnormal findings of blood chemistry: Secondary | ICD-10-CM

## 2012-07-19 DIAGNOSIS — N19 Unspecified kidney failure: Secondary | ICD-10-CM

## 2012-07-19 DIAGNOSIS — F1410 Cocaine abuse, uncomplicated: Secondary | ICD-10-CM

## 2012-07-19 LAB — RENAL FUNCTION PANEL
BUN: 58 mg/dL — ABNORMAL HIGH (ref 6–23)
CO2: 25 mEq/L (ref 19–32)
Chloride: 103 mEq/L (ref 96–112)
GFR calc Af Amer: 9 mL/min — ABNORMAL LOW (ref >90)
Glucose, Bld: 85 mg/dL (ref 70–99)
Phosphorus: 5 mg/dL — ABNORMAL HIGH (ref 2.3–4.6)
Potassium: 3.6 mEq/L (ref 3.5–5.1)
Sodium: 138 mEq/L (ref 135–145)

## 2012-07-19 LAB — CBC
HCT: 21.6 % — ABNORMAL LOW (ref 39.0–52.0)
Hemoglobin: 7.3 g/dL — ABNORMAL LOW (ref 13.0–17.0)
MCHC: 33.8 g/dL (ref 30.0–36.0)
RBC: 2.5 MIL/uL — ABNORMAL LOW (ref 4.22–5.81)

## 2012-07-19 LAB — TYPE AND SCREEN
ABO/RH(D): A POS
Antibody Screen: NEGATIVE
Unit division: 0
Unit division: 0

## 2012-07-19 LAB — MAGNESIUM: Magnesium: 1.9 mg/dL (ref 1.5–2.5)

## 2012-07-19 MED ORDER — ACETAMINOPHEN 325 MG PO TABS
ORAL_TABLET | ORAL | Status: AC
  Filled 2012-07-19: qty 2

## 2012-07-19 MED ORDER — SORBITOL 70 % SOLN
30.0000 mL | Freq: Every day | Status: DC | PRN
Start: 2012-07-19 — End: 2012-07-22
  Administered 2012-07-20: 30 mL via ORAL
  Filled 2012-07-19: qty 30

## 2012-07-19 MED ORDER — CARVEDILOL 25 MG PO TABS
25.0000 mg | ORAL_TABLET | Freq: Two times a day (BID) | ORAL | Status: DC
  Administered 2012-07-19 – 2012-07-21 (×4): 25 mg via ORAL
  Filled 2012-07-19 (×6): qty 1

## 2012-07-19 MED ORDER — AMLODIPINE BESYLATE 10 MG PO TABS
10.0000 mg | ORAL_TABLET | Freq: Every day | ORAL | Status: DC
  Administered 2012-07-20 – 2012-07-22 (×3): 10 mg via ORAL
  Filled 2012-07-19 (×3): qty 1

## 2012-07-19 NOTE — Progress Notes (Signed)
07/19/2012 4:03 PM Hemodialysis Outpatient note; This patient has been accepted at the [facility] location on a TTS 2nd shift schedule. The center can begin patient on Thurs 08.08.13 . The patient needs to arrive at the center for 1030 AM. I will inform patient of schedule. Thank you. Gordy Savers

## 2012-07-19 NOTE — Clinical Social Work Psychosocial (Signed)
     Clinical Social Work Department BRIEF PSYCHOSOCIAL ASSESSMENT 07/19/2012  Patient:  [name],[name]     Account Number:  [id number]     Admit date:  07/15/2012  Clinical Social Worker:  Frederico Hamman  Date/Time:  07/19/2012 04:53 AM  Referred by:  Physician  Date Referred:  07/19/2012 Referred for  Transportation assistance   Other Referral:   Patient's daughter [name] [name] was also in the room.   Interview type:  Patient Other interview type:    PSYCHOSOCIAL DATA Living Status:  ALONE Admitted from facility:   Level of care:   Primary support name:   Primary support relationship to patient:   Degree of support available:   Daughter was at the bedside along with another visitor. Patient was comfortable with CSW talking with persons in the room present.    CURRENT CONCERNS Current Concerns  Other - See comment   Other Concerns:   Assistance with transportation to dialysis requested    SOCIAL WORK ASSESSMENT / PLAN CSW talked with patient about dialysis transportation and provided him with a SCAT application. CSW informed patient that if he has any questions, CSW will follow-up with him on Tuesday.    Patient advised CSW that his Medicaid application has been done and he is waiting to hear about when the person will come to do the disability application. CSW advised that follow-up will be done with financial counselor.   Assessment/plan status:   Other assessment/ plan:   Information/referral to community resources:   SCAT application    PATIENTS/FAMILYS RESPONSE TO PLAN OF CARE: Patient and family very pleasant and receptive to talking with CSW. Patient appreciative of CSW assistance.

## 2012-07-19 NOTE — Care Management Note (Unsigned)
    Page 1 of 1   07/19/2012     3:53:16 PM   CARE MANAGEMENT NOTE 07/19/2012  Patient:  [name] [name],[name] [name]   Account Number:  [id number]  Date Initiated:  07/16/2012  Documentation initiated by:  Felix Ahmadi Assessment:   46 yr-old male with h/o polysubstance abuse and noncompliance adm with dx of ARF, lives with mother and dad, independent PTA.     Action/Plan:   Anticipated DC Date:     Anticipated DC Plan:    In-house referral  Financial Counselor  Clinical Social Worker         Choice offered to / List presented to:             Status of service:   Medicare Important Message given?   (If response is "NO", the following Medicare IM given date fields will be blank) Date Medicare IM given:   Date Additional Medicare IM given:    Discharge Disposition:    Per UR Regulation:    If discussed at Long Length of Stay Meetings, dates discussed:    Comments:  Primary care @ [facility]  07-19-12 [address] [place] [phone] CM will continue to [facility] for disposition needs.   07/16/12 [place] MSN CCM Pt sleeping.  Per mother, financial counselor called and will bring applications to room for completion.  Discussed PCP issue, pt will have options for primary care when he receives MCD pending #.   Vascular surgery to place HD access.

## 2012-07-19 NOTE — Progress Notes (Signed)
Patient ID: [name] [name]  male  [id number]    DOB: 10-03-1966    DOA: 07/15/2012  PCP: Pcp Not In System  Brief narrative:  46 year old, African American male, with a past medical history of Hypertension, chronic kidney disease, who is noncompliant, admitted with epistaxis, stabilized after acute management by ENT, currently with nasal packing after cauterization. Started on hemodialysis, new, due to progression of his chronic kidney disease, uremia and accelerated hypertension. He has been previously noncompliant with renal. Access placement is scheduled for Wednesday, 07/21/2012.   Consultants:  Nephrology  Vascular  ENT  Procedures:  endoscopic ligation of bilateral sphenopalatine arteries 07/15/12  Antibiotics:  Rocephin 8/1>>   Subjective: No specific complaints, seen on HD, does not like renal diet.  Objective: Weight change: 7.7 kg (16 lb 15.6 oz)  Intake/Output Summary (Last 24 hours) at 07/19/12 1120 Last data filed at 07/19/12 0900  Gross per 24 hour  Intake    360 ml  Output   3651 ml  Net  -3291 ml   Blood pressure 169/108, pulse 72, temperature 97.8 F (36.6 C), temperature source Oral, resp. rate 18, height 5\' 9"  (1.753 m), weight 68.2 kg (150 lb 5.7 oz), SpO2 98.00%.  Physical Exam: General: Alert and awake, oriented x3, not in any acute distress. HEENT: anicteric sclera, pupils reactive to light and accommodation, EOMI, nasal packing+ CVS: S1-S2 clear, no murmur rubs or gallops Chest: clear to auscultation bilaterally, no wheezing, rales or rhonchi Abdomen: soft nontender, nondistended, normal bowel sounds, no organomegaly Extremities: no cyanosis, clubbing. 2+ edema noted bilaterally Neuro: Cranial nerves II-XII intact, no focal neurological deficits  Lab Results: Basic Metabolic Panel:  Lab [id number] 0910 07/18/12 0756  NA 138 140  K 3.6 4.3  CL 103 105  CO2 25 18*  GLUCOSE 85 102*  BUN 58* 101*  CREATININE 7.87* 10.86*  CALCIUM 7.5* 6.5*  MG  1.9 --  PHOS 5.0* --   Liver Function Tests:  Lab 07/19/12 0910 07/18/12 0756 07/16/12 1321 07/15/12 1007  AST -- -- 14 20  ALT -- -- 13 17  ALKPHOS -- -- 49 53  BILITOT -- -- 0.1* 0.1*  PROT -- -- 5.1* 5.2*  ALBUMIN 2.3* 2.3* -- --   CBC:  Lab 07/19/12 0910 07/17/12 0520 07/15/12 1007  WBC 7.4 10.5 --  NEUTROABS -- -- 4.1  HGB 7.3* 7.6* --  HCT 21.6* 22.0* --  MCV 86.4 84.6 --  PLT 172 239 --   Cardiac Enzymes:  Lab 07/16/12 1320 07/16/12 0410 07/15/12 2146  CKTOTAL 520* 585* 569*  CKMB 10.7* 11.6* 10.1*  CKMBINDEX -- -- --  TROPONINI <0.30 <0.30 <0.30    Studies/Results: Dg Chest 2 View  07/15/2012  *RADIOLOGY REPORT*  Clinical Data: Epistaxis  CHEST - 2 VIEW  Comparison: 03/10/2011 10/20/2010  Findings: Stable moderate cardiomegaly.  Thoracic aorta contour is normal.  The lungs are clear.  Negative for edema, airspace disease, or pleural effusion.  No acute or suspicious bony abnormality.  Visualized upper abdomen is normal.  IMPRESSION: Stable moderate cardiomegaly with mild pulmonary vascular congestion.  The lungs are clear.  Original Report Authenticated By: Curlene Dolphin, M.D.   Ct Head Wo Contrast  07/15/2012  *RADIOLOGY REPORT*  Clinical Data: 46 year old male with headache and epistaxis.  CT HEAD WITHOUT CONTRAST  Technique:  Contiguous axial images were obtained from the base of the skull through the vertex without contrast.  Comparison: 07/14/2012  Findings: No intracranial abnormalities are identified, including mass lesion or  mass effect, hydrocephalus, extra-axial fluid collection, midline shift, hemorrhage, or acute infarction.  The visualized bony calvarium is unremarkable. High attenuation fluid in the nasal cavity and paranasal sinuses noted compatible with blood.  IMPRESSION: No evidence of intracranial quality.  Blood in the nasal cavity and paranasal sinuses.  Original Report Authenticated By: Lura Em, M.D.   US Renal  07/15/2012  *RADIOLOGY REPORT*   Clinical Data: 46 year old male with acute on chronic renal failure.  RENAL/URINARY TRACT ULTRASOUND COMPLETE  Comparison:  10/21/2010 ultrasound  Findings:  Right Kidney:  The right kidney is small and echogenic measuring 6 cm in greatest longitudinal dimension.  There is no evidence of solid mass, hydronephrosis or definite renal calculi.  Left Kidney:  The left kidney is small and echogenic measuring 7.5 cm in greatest longitudinal dimension.  There is no evidence of hydronephrosis, solid renal mass or definite renal calculi.  Bladder:  The bladder is normal for degree of filling.  IMPRESSION: Small echogenic kidneys compatible with chronic medical renal disease.  No evidence of hydronephrosis.  Original Report Authenticated By: Lura Em, M.D.   Dg Chest Port 1 View  07/17/2012  *RADIOLOGY REPORT*  Clinical Data: Tunneled dialysis catheter placement.  Rule out pneumothorax.  PORTABLE CHEST - 1 VIEW  Comparison: 07/15/2012.  Findings: There is a new right internal jugular PermCath with tips terminating in the right atrium and at the superior cavoatrial junction.  Lung volumes are normal.  No consolidative airspace disease.  No pleural effusions.  No pneumothorax.  Mild pulmonary venous congestion without frank pulmonary edema.  Heart size is mildly enlarged.  Mediastinal contours are unremarkable.  IMPRESSION: 1.  Right internal jugular PermCath in proper position, as above. No associated pneumothorax. 2.  Cardiomegaly with pulmonary venous congestion but no frank pulmonary edema.  Original Report Authenticated By: Etheleen Mayhew, M.D.    Medications: Scheduled Meds:    . alteplase  2 mg Intracatheter Once  . amLODipine  5 mg Oral Daily  . calcium acetate  1,334 mg Oral TID WC  . carvedilol  12.5 mg Oral BID WC  . cephALEXin  500 mg Oral Q12H  . darbepoetin (ARANESP) injection - DIALYSIS  100 mcg Intravenous Q Sat-HD  . ferric gluconate (FERRLECIT/NULECIT) IV  125 mg Intravenous Q  T,Th,Sa-HD  . ferric gluconate (FERRLECIT/NULECIT) IV  125 mg Intravenous Q Thu-HD  . folic acid  1 mg Oral Daily  . isosorbide mononitrate  60 mg Oral Daily  . LORazepam  0-4 mg Oral Q12H  . multivitamin with minerals  1 tablet Oral Daily  . oxymetazoline  2 spray Each Nare BID  . pantoprazole  40 mg Oral Q1200  . paricalcitol  2 mcg Intravenous Q T,Th,Sa-HD  . sodium chloride  3 mL Intravenous Q12H  . thiamine  100 mg Oral Daily   Or  . thiamine  100 mg Intravenous Daily   Continuous Infusions:    Assessment/Plan: Principal Problem:  *Acute on chronic renal failure/New ESRD: likely progression of his chronic kidney disease due to non-compliance with anti-hypertensive regimen - Renal service following, renal ultrasound showed no hydronephrosis and bilateral small shrunken kidneys. - Hemodialysis initiated per renal recommendations x 3 days in row, seen on HD today. - Access/Fistula placement on 07/21/2012, vascular surgery following  Epistaxis: Likely secondary to hypertension, advanced renal dysfunction/uremia and use of crack cocaine - Resolved after acute management by ENT, nasal packing and cauterization - DDAVP was given 07/15/2012, currently no active bleeding  Accelerated hypertension: Still not well controlled - Increase Coreg to 25 mg BID, Norvasc to 10 mg, continue Imdur  Acute blood loss anemia secondary to epistaxis with anemia of chronic kidney disease, hemoglobin still low 7.3 but no active bleeding - Status post transfusion 1 unit on 8/2, started on Aranesp, iron   History of ALCOHOL ABUSE/ TOBACCO ABUSE/ COCAINE ABUSE - Urine drug screen was positive for cocaine and marijuana, currently on Ativan withdrawal protocol, stable  Mildly Elevated troponin: Probably secondary to renal failure, further troponins negative, asymptomatic  Chronic CHF: Systolic, current echo on 07/16/2012 showed EF of A999333, grade 2 diastolic dysfunction  DVT Prophylaxis: SCDs  Code  Status: Full  Disposition: Not medically ready, will need CLIP process for outpatient hemodialysis center, SW aware.   LOS: 4 days   RAI,RIPUDEEP M.D. [facility] 07/19/2012, 11:20 AM Pager: [phone]  If 7PM-7AM, please contact night-coverage [email or web address] Password [email or web address]

## 2012-07-19 NOTE — Progress Notes (Addendum)
Pt. Has 8 runs of V-tach,pt. Is asymptomatic.MD. BP 162/96.Has been notified.keep assessing pt. Closely. Orders were received.

## 2012-07-19 NOTE — Progress Notes (Signed)
[facility]  R staged BVT seems to be this pt's only only fistula options and that is seems questionable.  Would repeat the L arm vein mapping to look at brachial vein.  Regardless, access placement scheduled for Wednesday if pt is willing.  He has been reluctant in the past.  Adele Barthel, MD [facility]: [phone] Pager: [phone]  07/19/2012, 9:37 AM

## 2012-07-19 NOTE — Progress Notes (Signed)
Subjective:  Has nausea and vomited once. No SOB. Still has minimal nose bleeding.  The patient had a R IJ HD catheter placed 07/17/12 Tolerated 2 HD, will get 3rc HD today  Plan for fistula placement 07/21/12 by verbal report -  Vein mapping has been ordered  Objective: Vital signs in last 24 hours: Filed Vitals:   07/18/12 1630 07/18/12 1813 07/18/12 2017 07/19/12 0449  BP: 188/108 197/111 162/101 134/90  Pulse: 84  78 85  Temp: 98.2 F (36.8 C)  97.9 F (36.6 C) 97.8 F (36.6 C)  TempSrc: Oral  Oral Oral  Resp: 15  16 16   Height:      Weight:   154 lb 5.2 oz (70 kg)   SpO2: 99%  100% 100%   Weight change: 16 lb 15.6 oz (7.7 kg)  Intake/Output Summary (Last 24 hours) at 07/19/12 0811 Last data filed at 07/19/12 0100  Gross per 24 hour  Intake    360 ml  Output   3651 ml  Net  -3291 ml   Urine 1250 (8/2); 2900 (8/3); 1400 (8/4), No urine ouput recorded on 8/5  Body weight:  07/19/12  07/18/12 0950 160 lbs 15 oz (73 kg) pre-dialysis 07/17/12 2304 162 lbs 0.6 oz (73.5 kg) post-dialysis 07/17/12 0416 147 lb 11.3 oz    (67.0  kg) 07/15/12 0942 160 lb                 (72.6 kg)  General: lying in bed, resting, NAD, Right sided tunneled dialysis catheter in place. HEENT: pupils equal round and reactive to light, vision grossly intact, oropharynx clear and non-erythematous, minimal crusted blood noted below nares, nasal packing in place Neck: supple, no lymphadenopathy Lungs: clear to ascultation bilaterally, normal work of respiration, no wheezes, rales, ronchi Heart: regular rate and rhythm, no murmurs, gallops, or rubs Abdomen: soft, non-tender, non-distended, normal bowel sounds Extremities: 1+ bilateral pitting edema Neurologic: alert & oriented X3, cranial nerves II-XII intact, strength grossly intact, sensation intact to light touch   Lab Results: Basic Metabolic Panel:  Lab [id number] 07/17/12 0520  NA 140 135  K 4.3 3.4*  CL 105 100  CO2 18* 17*  GLUCOSE  102* 115*  BUN 101* 126*  CREATININE 10.86* 12.07*  CALCIUM 6.5* 5.6*  MG -- --  PHOS 6.8* 7.8*     Ref. Range 06/24/2009 05:42 10/21/2010 14:20 07/15/2012 18:06  PTH Latest Range: 14.0-72.0 pg/mL 213.7 (H) 317.3 (H) 684.9 (H)    Liver Function Tests:  Lab 07/18/12 0756 07/17/12 0520 07/16/12 1321 07/15/12 1007  AST -- -- 14 20  ALT -- -- 13 17  ALKPHOS -- -- 49 53  BILITOT -- -- 0.1* 0.1*  PROT -- -- 5.1* 5.2*  ALBUMIN 2.3* 2.4* -- --   CBC:  Lab 07/17/12 0520 07/16/12 2013 07/15/12 1007  WBC 10.5 12.6* --  NEUTROABS -- -- 4.1  HGB 7.6* 7.4* --  HCT 22.0* 21.4* --  MCV 84.6 83.6 --  PLT 239 241 --   Cardiac Enzymes:  Lab 07/16/12 1320 07/16/12 0410 07/15/12 2146  CKTOTAL 520* 585* 569*  CKMB 10.7* 11.6* 10.1*  CKMBINDEX -- -- --  TROPONINI <0.30 <0.30 <0.30   BNP:  Lab 07/15/12 1008  PROBNP 31955.0*     Lab 07/17/12 0520 07/15/12 1806  LABPROT 14.4 16.9*  INR 1.10 1.35   Anemia Panel:  Lab 07/15/12 2144 07/15/12 1806  VITAMINB12 489 --  FOLATE 7.1 --  FERRITIN -- 27  TIBC -- 337  IRON -- 57  RETICCTPCT 2.6 --   Urine Drug Screen: Drugs of Abuse     Component Value Date/Time   LABOPIA NONE DETECTED 07/15/2012 2328   COCAINSCRNUR POSITIVE* 07/15/2012 2328   LABBENZ NONE DETECTED 07/15/2012 2328   AMPHETMU NONE DETECTED 07/15/2012 2328   THCU POSITIVE* 07/15/2012 2328   LABBARB NONE DETECTED 07/15/2012 2328    Urinalysis:  Lab 07/15/12 2328  COLORURINE YELLOW  LABSPEC 1.015  PHURINE 5.0  GLUCOSEU NEGATIVE  HGBUR NEGATIVE  BILIRUBINUR NEGATIVE  KETONESUR NEGATIVE  PROTEINUR NEGATIVE  UROBILINOGEN 0.2  NITRITE NEGATIVE  LEUKOCYTESUR NEGATIVE    Studies/Results: Dg Chest 2 View  07/15/2012  *RADIOLOGY REPORT*  Clinical Data: Epistaxis  CHEST - 2 VIEW  Comparison: 03/10/2011 10/20/2010  Findings: Stable moderate cardiomegaly.  Thoracic aorta contour is normal.  The lungs are clear.  Negative for edema, airspace disease, or pleural effusion.  No acute  or suspicious bony abnormality.  Visualized upper abdomen is normal.  IMPRESSION: Stable moderate cardiomegaly with mild pulmonary vascular congestion.  The lungs are clear.  Original Report Authenticated By: Curlene Dolphin, M.D.   Ct Head Wo Contrast  07/15/2012  *RADIOLOGY REPORT*  Clinical Data: 46 year old male with headache and epistaxis.  CT HEAD WITHOUT CONTRAST  Technique:  Contiguous axial images were obtained from the base of the skull through the vertex without contrast.  Comparison: 07/14/2012  Findings: No intracranial abnormalities are identified, including mass lesion or mass effect, hydrocephalus, extra-axial fluid collection, midline shift, hemorrhage, or acute infarction.  The visualized bony calvarium is unremarkable. High attenuation fluid in the nasal cavity and paranasal sinuses noted compatible with blood.  IMPRESSION: No evidence of intracranial quality.  Blood in the nasal cavity and paranasal sinuses.  Original Report Authenticated By: Lura Em, M.D.   US Renal  07/15/2012  *RADIOLOGY REPORT*  Clinical Data: 46 year old male with acute on chronic renal failure.  RENAL/URINARY TRACT ULTRASOUND COMPLETE  Comparison:  10/21/2010 ultrasound  Findings:  Right Kidney:  The right kidney is small and echogenic measuring 6 cm in greatest longitudinal dimension.  There is no evidence of solid mass, hydronephrosis or definite renal calculi.  Left Kidney:  The left kidney is small and echogenic measuring 7.5 cm in greatest longitudinal dimension.  There is no evidence of hydronephrosis, solid renal mass or definite renal calculi.  Bladder:  The bladder is normal for degree of filling.  IMPRESSION: Small echogenic kidneys compatible with chronic medical renal disease.  No evidence of hydronephrosis.  Original Report Authenticated By: Lura Em, M.D.   Medications: Scheduled Meds:    . alteplase  2 mg Intracatheter Once  . amLODipine  5 mg Oral Daily  . calcium acetate  1,334 mg Oral  TID WC  . carvedilol  12.5 mg Oral BID WC  . cephALEXin  500 mg Oral Q12H  . darbepoetin (ARANESP) injection - DIALYSIS  100 mcg Intravenous Q Sat-HD  . ferric gluconate (FERRLECIT/NULECIT) IV  125 mg Intravenous Q T,Th,Sa-HD  . ferric gluconate (FERRLECIT/NULECIT) IV  125 mg Intravenous Q Thu-HD  . folic acid  1 mg Oral Daily  . isosorbide mononitrate  60 mg Oral Daily  . LORazepam  0-4 mg Oral Q12H  . multivitamin with minerals  1 tablet Oral Daily  . oxymetazoline  2 spray Each Nare BID  . pantoprazole  40 mg Oral Q1200  . paricalcitol  2 mcg Intravenous Q T,Th,Sa-HD  . sodium chloride  3 mL Intravenous  Q12H  . thiamine  100 mg Oral Daily   Or  . thiamine  100 mg Intravenous Daily  . DISCONTD: pantoprazole (PROTONIX) IV  40 mg Intravenous Q24H   Continuous Infusions:  PRN Meds:.acetaminophen, acetaminophen, albuterol, cloNIDine, hydrALAZINE, HYDROcodone-acetaminophen, LORazepam, LORazepam, morphine injection, ondansetron (ZOFRAN) IV, ondansetron, senna-docusate  Assessment/Plan:  1. ESRD: HTN (poor medication compliance and cocaine use). He had hypervolemic status on admission as evidenced by CXR and leg edema and SOB. Renal US showed no hydronephrosis and bilaterally small shrunken kidneys. Protein/cre ratio is slightly elevated at 0.16 (normal <0.15). HBsAg and HBsAb are negative.  -s/p R IJ HD catheter 8/3, plan for AV fistula placement 8/7 -s/p HD 8/3 and 8/4) and will get 3rd  HD 8/5 -CLIP process has been started - has no Medicaid or insurance - will need emergency Medicaid application -Pt to see Financial counselor on Monday -lasix has been discontinued   2. Epistaxis: Probably associated with hypertension but also in part from dysfunctional platelets of advanced renal dysfunction/uremia and use of crack cocaine.  -s/p nasal packing and bilateral sphenopalatine artery cauterization by ENT 8/1. -per ENT, nasal packs should stay in place for 7 days (do not remove before  8/8) -on keflex while nasal packs in place -DDAVP was given (8/1). Patient believes he may still be having slow bleeding from nostrils  3. Hypertensive urgency: bp was 171/21 on admission. Currently on Imdur, amlodipine, carvedilol, and prn IV hydralazine.  -Per primary team. Today Bp is 134/90  4. Anemia of chronic kidney disease complicated by acute blood loss anemia from epistaxis: both iron and ferritin are at low normal range. It is likely due to iron deficiency. Vitamin B12 normal.  -S/p transfusion 1 unit (8/2) -S/p aranesp 100 (8/3) and ferric gluconate 125 mg (8/3)   Lab 07/17/12 0520 07/16/12 2013 07/16/12 1321 07/16/12 0410 07/15/12 2144  HGB 7.6* 7.4* 6.9* 7.6* 8.3*   5. 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  Ap 110 Vp 70 BFR 300.  Working on outpt spot.  He is agreeable to access placement. Plan next HD Wed.

## 2012-07-19 NOTE — Progress Notes (Signed)
VASCULAR LAB PRELIMINARY  PRELIMINARY  PRELIMINARY  PRELIMINARY  Left brachial vein map completed.    Preliminary report:   [name] [name], 07/19/2012, 7:38 PM

## 2012-07-20 ENCOUNTER — Inpatient Hospital Stay (HOSPITAL_COMMUNITY): Payer: Medicaid Other

## 2012-07-20 ENCOUNTER — Encounter (HOSPITAL_COMMUNITY): Payer: Self-pay | Admitting: Anesthesiology

## 2012-07-20 LAB — RENAL FUNCTION PANEL
CO2: 29 mEq/L (ref 19–32)
Chloride: 103 mEq/L (ref 96–112)
GFR calc Af Amer: 12 mL/min — ABNORMAL LOW (ref >90)
GFR calc non Af Amer: 11 mL/min — ABNORMAL LOW (ref >90)
Glucose, Bld: 135 mg/dL — ABNORMAL HIGH (ref 70–99)
Potassium: 3.5 mEq/L (ref 3.5–5.1)
Sodium: 139 mEq/L (ref 135–145)

## 2012-07-20 MED ORDER — HYDROCODONE-ACETAMINOPHEN 5-325 MG PO TABS
ORAL_TABLET | ORAL | Status: AC
  Administered 2012-07-20: 2 via ORAL
  Filled 2012-07-20: qty 2

## 2012-07-20 MED ORDER — CLONIDINE HCL 0.1 MG PO TABS
0.1000 mg | ORAL_TABLET | Freq: Two times a day (BID) | ORAL | Status: DC
  Administered 2012-07-20 – 2012-07-22 (×5): 0.1 mg via ORAL
  Filled 2012-07-20 (×6): qty 1

## 2012-07-20 MED ORDER — PARICALCITOL 5 MCG/ML IV SOLN
INTRAVENOUS | Status: AC
  Administered 2012-07-20: 2 ug via INTRAVENOUS
  Filled 2012-07-20: qty 1

## 2012-07-20 NOTE — Progress Notes (Signed)
Patient ID: [name] [name]  male  [id number]    DOB: Jul 04, 1966    DOA: 07/15/2012  PCP: Pcp Not In System  Brief narrative and hospitalization course so far:  46 year old, African American male, with a past medical history of Hypertension, chronic kidney disease, who is noncompliant, admitted with epistaxis, stabilized after acute management by ENT, currently with nasal packing after cauterization. Started on hemodialysis, new, due to progression of his chronic kidney disease, uremia and accelerated hypertension. He has been previously noncompliant with renal. Access placement is scheduled for Wednesday, 07/21/2012. The plan is to schedule his outpatient hemodialysis on Tuesday, Thursday, Saturday after the CLIP process is complete. Patient will have the nasal packing removed by Dr. Simeon Craft in office on 07/22/2012 at 3:00PM. (Please notify Dr. Theressa Millard office if the patient's discharge is  delayed for some reason).   Consultants:  Nephrology - Dr Mercy Moore Vascular - Dr Bridgett Larsson ENT- Dr Ruby Cola  Procedures:  endoscopic ligation of bilateral sphenopalatine arteries 07/15/12  Antibiotics:  Keflex   Subjective:  No specific complaints, somewhat anxious about his hemodialysis which is definitive for future. Otherwise no chest pain, shortness of breath cough fevers, chills or abdominal pain.  Objective: Weight change: -4.8 kg (-10 lb 9.3 oz)  Intake/Output Summary (Last 24 hours) at 07/20/12 1526 Last data filed at 07/20/12 1513  Gross per 24 hour  Intake   1770 ml  Output    600 ml  Net   1170 ml   Blood pressure 154/99, pulse 67, temperature 96.7 F (35.9 C), temperature source Oral, resp. rate 12, height 5\' 9"  (1.753 m), weight 66.3 kg (146 lb 2.6 oz), SpO2 100.00%.  Physical Exam: General: Alert and awake, oriented x3, not in any acute distress. HEENT: anicteric sclera, pupils reactive to light and accommodation, EOMI, nasal packing+ CVS: S1-S2 clear, no murmur rubs or  gallops Chest: clear to auscultation bilaterally, no wheezing, rales or rhonchi Abdomen: soft nontender, nondistended, normal bowel sounds, no organomegaly Extremities: no cyanosis, clubbing. 2+ edema noted bilaterally Neuro: Cranial nerves II-XII intact, no focal neurological deficits  Lab Results: Basic Metabolic Panel:  Lab [id number] 07/19/12 0910  NA 139 138  K 3.5 3.6  CL 103 103  CO2 29 25  GLUCOSE 135* 85  BUN 31* 58*  CREATININE 5.87* 7.87*  CALCIUM 7.5* 7.5*  MG -- 1.9  PHOS 3.6 --   Liver Function Tests:  Lab 07/20/12 0920 07/19/12 0910 07/16/12 1321 07/15/12 1007  AST -- -- 14 20  ALT -- -- 13 17  ALKPHOS -- -- 49 53  BILITOT -- -- 0.1* 0.1*  PROT -- -- 5.1* 5.2*  ALBUMIN 2.4* 2.3* -- --   CBC:  Lab 07/19/12 0910 07/17/12 0520 07/15/12 1007  WBC 7.4 10.5 --  NEUTROABS -- -- 4.1  HGB 7.3* 7.6* --  HCT 21.6* 22.0* --  MCV 86.4 84.6 --  PLT 172 239 --   Cardiac Enzymes:  Lab 07/16/12 1320 07/16/12 0410 07/15/12 2146  CKTOTAL 520* 585* 569*  CKMB 10.7* 11.6* 10.1*  CKMBINDEX -- -- --  TROPONINI <0.30 <0.30 <0.30    Studies/Results: Dg Chest 2 View  07/15/2012  *RADIOLOGY REPORT*  Clinical Data: Epistaxis  CHEST - 2 VIEW  Comparison: 03/10/2011 10/20/2010  Findings: Stable moderate cardiomegaly.  Thoracic aorta contour is normal.  The lungs are clear.  Negative for edema, airspace disease, or pleural effusion.  No acute or suspicious bony abnormality.  Visualized upper abdomen is normal.  IMPRESSION: Stable moderate  cardiomegaly with mild pulmonary vascular congestion.  The lungs are clear.  Original Report Authenticated By: Curlene Dolphin, M.D.   Ct Head Wo Contrast  07/15/2012  *RADIOLOGY REPORT*  Clinical Data: 46 year old male with headache and epistaxis.  CT HEAD WITHOUT CONTRAST  Technique:  Contiguous axial images were obtained from the base of the skull through the vertex without contrast.  Comparison: 07/14/2012  Findings: No intracranial  abnormalities are identified, including mass lesion or mass effect, hydrocephalus, extra-axial fluid collection, midline shift, hemorrhage, or acute infarction.  The visualized bony calvarium is unremarkable. High attenuation fluid in the nasal cavity and paranasal sinuses noted compatible with blood.  IMPRESSION: No evidence of intracranial quality.  Blood in the nasal cavity and paranasal sinuses.  Original Report Authenticated By: Lura Em, M.D.   US Renal  07/15/2012  *RADIOLOGY REPORT*  Clinical Data: 45 year old male with acute on chronic renal failure.  RENAL/URINARY TRACT ULTRASOUND COMPLETE  Comparison:  10/21/2010 ultrasound  Findings:  Right Kidney:  The right kidney is small and echogenic measuring 6 cm in greatest longitudinal dimension.  There is no evidence of solid mass, hydronephrosis or definite renal calculi.  Left Kidney:  The left kidney is small and echogenic measuring 7.5 cm in greatest longitudinal dimension.  There is no evidence of hydronephrosis, solid renal mass or definite renal calculi.  Bladder:  The bladder is normal for degree of filling.  IMPRESSION: Small echogenic kidneys compatible with chronic medical renal disease.  No evidence of hydronephrosis.  Original Report Authenticated By: Lura Em, M.D.   Dg Chest Port 1 View  07/17/2012  *RADIOLOGY REPORT*  Clinical Data: Tunneled dialysis catheter placement.  Rule out pneumothorax.  PORTABLE CHEST - 1 VIEW  Comparison: 07/15/2012.  Findings: There is a new right internal jugular PermCath with tips terminating in the right atrium and at the superior cavoatrial junction.  Lung volumes are normal.  No consolidative airspace disease.  No pleural effusions.  No pneumothorax.  Mild pulmonary venous congestion without frank pulmonary edema.  Heart size is mildly enlarged.  Mediastinal contours are unremarkable.  IMPRESSION: 1.  Right internal jugular PermCath in proper position, as above. No associated pneumothorax. 2.   Cardiomegaly with pulmonary venous congestion but no frank pulmonary edema.  Original Report Authenticated By: Etheleen Mayhew, M.D.    Medications: Scheduled Meds:    . acetaminophen      . amLODipine  10 mg Oral Daily  . calcium acetate  1,334 mg Oral TID WC  . carvedilol  25 mg Oral BID WC  . cephALEXin  500 mg Oral Q12H  . cloNIDine  0.1 mg Oral BID  . darbepoetin (ARANESP) injection - DIALYSIS  100 mcg Intravenous Q Sat-HD  . ferric gluconate (FERRLECIT/NULECIT) IV  125 mg Intravenous Q T,Th,Sa-HD  . ferric gluconate (FERRLECIT/NULECIT) IV  125 mg Intravenous Q Thu-HD  . folic acid  1 mg Oral Daily  . isosorbide mononitrate  60 mg Oral Daily  . LORazepam  0-4 mg Oral Q12H  . multivitamin with minerals  1 tablet Oral Daily  . oxymetazoline  2 spray Each Nare BID  . pantoprazole  40 mg Oral Q1200  . paricalcitol  2 mcg Intravenous Q T,Th,Sa-HD  . sodium chloride  3 mL Intravenous Q12H  . thiamine  100 mg Oral Daily   Or  . thiamine  100 mg Intravenous Daily   Continuous Infusions:    Assessment/Plan: Principal Problem:  *Acute on chronic renal failure/New ESRD: likely  progression of his chronic kidney disease due to non-compliance with anti-hypertensive regimen - Renal service following, renal ultrasound showed no hydronephrosis and bilateral small shrunken kidneys. - Hemodialysis initiated per renal recommendations x 3 days in row.  - Access/Fistula placement on 07/21/2012, vascular surgery following - Discussed with Dr. Mercy Moore today, they will schedule patient on Tues-Thurs-Sat out-patient schedule, so HD again today.  Epistaxis: Likely secondary to hypertension, advanced renal dysfunction/uremia and use of crack cocaine - Resolved after acute management by ENT, nasal packing and cauterization - DDAVP was given 07/15/2012, currently no active bleeding - Discussed with Dr. Theressa Millard office, nasal packing will be removed outpatient in office on 07/22/2012 at 3 PM. I  have placed the appointment time in the discharge AVS, please notify Dr. Theressa Millard office if the discharge is delayed for some reason to coordinate the removal. It needs to be removed postop days #7).  Accelerated hypertension: Still not well controlled - Added clonidine, continue Coreg to 25 mg BID, Norvasc 10 mg, continue Imdur  Acute blood loss anemia secondary to epistaxis with anemia of chronic kidney disease, hemoglobin still low 7.3 but no active bleeding - Status post transfusion 1 unit on 8/2, started on Aranesp, iron, 2 units today with hemodialysis   History of ALCOHOL ABUSE/ TOBACCO ABUSE/ COCAINE ABUSE - Urine drug screen was positive for cocaine and marijuana, currently on Ativan withdrawal protocol, stable  Mildly Elevated troponin: Probably secondary to renal failure, further troponins negative, asymptomatic  Chronic CHF: Systolic, current echo on 07/16/2012 showed EF of [id number], grade 2 diastolic dysfunction  DVT Prophylaxis: SCDs  Code Status: Full  Disposition: Not medically ready, will need CLIP process for outpatient hemodialysis center, SW aware.    LOS: 5 days   Beckie Viscardi M.D. [facility] 07/20/2012, 3:26 PM Pager: [phone]  If 7PM-7AM, please contact night-coverage [email or web address] Password [email or web address]

## 2012-07-20 NOTE — Progress Notes (Signed)
Pt complaining of bladder discomfort, bladder scan revealed 325cc in bladder, notified Dr. Rai,MD ordered I&O cath, RN carried out order, UOP s/p cath 600 cc, will continue to monitor

## 2012-07-20 NOTE — Plan of Care (Signed)
Problem: Food- and Nutrition-Related Knowledge Deficit (NB-1.1) Goal: Nutrition education Formal process to instruct or train a patient/client in a skill or to impart knowledge to help patients/clients voluntarily manage or modify food choices and eating behavior to maintain or improve health.  Outcome: Completed/Met Date Met:  07/20/12 Nutrition Education Note  RD consulted for Renal Education. Provided Renal Pyramid to patient. Reviewed food groups and provided written recommended serving sizes specifically determined for patient's current nutritional status. Explained why diet restrictions are needed and provided lists of foods to limit/avoid that are high potassium, sodium, and phosphorus. Provided specific recommendations on safer alternatives of these foods. Strongly encouraged compliance of this diet.   Discussed importance of protein intake at each meal and snack. Provided examples of how to maximize protein intake throughout the day. Encouraged pt to discuss specific diet questions/concerns with RD at HD outpatient facility.   Expect poor to fair compliance.  Body mass index is 21.58 kg/(m^2). Pt meets criteria for Normal based on current BMI.  Current diet order is Renal 80-90, patient is consuming approximately 50-100% of meals at this time. Labs and medications reviewed. No further nutrition interventions warranted at this time. RD contact information provided. If additional nutrition issues arise, please re-consult RD.  Inda Coke MS, RD, LDN Pager: [phone] After-hours pager: [phone]

## 2012-07-20 NOTE — Progress Notes (Signed)
Pt received 2 uints of blood in hemodialysis; no s/s of rxn noted. Pt tolerated blood transfusion well.

## 2012-07-20 NOTE — Progress Notes (Signed)
Subjective:   Patient feels much better. No nausea, vomiting, SOB.  Nose bleeding resolved  The patient had a R IJ HD catheter placed 07/17/12  Tolerated 3 HD (8/5, 8/4, 8/3)  Left brachial vein map completed.   Plan for fistula placement 07/21/12 by verbal report   Objective: Vital signs in last 24 hours: Filed Vitals:   07/19/12 1305 07/19/12 1800 07/19/12 2137 07/20/12 0539  BP: 180/104 162/96 154/99 178/111  Pulse: 70 72 74 73  Temp: 98.4 F (36.9 C) 98 F (36.7 C) 97.7 F (36.5 C) 97.4 F (36.3 C)  TempSrc: Oral Oral Oral Oral  Resp: 20 18 18 18   Height:   5\' 9"  (1.753 m)   Weight:   146 lb 2.6 oz (66.3 kg)   SpO2: 100% 98% 99% 100%   Weight change: -10 lb 9.3 oz (-4.8 kg)  Intake/Output Summary (Last 24 hours) at 07/20/12 0641 Last data filed at 07/19/12 2141  Gross per 24 hour  Intake    480 ml  Output   2500 ml  Net  -2020 ml   Urine 1250 (8/2); 2900 (8/3); 1400 (8/4), No urine ouput recorded on 8/6  Body weight:  07/20/12  07/19/12 2137 146 lb 2.6 oz (66.3 kg) 07/19/12 1244 145 lb 4.5 oz (65.9 kg) 07/19/12 0840 150 lb 5.7 oz (68.2 kg) 07/18/12 0950 160 lbs 15 oz (73 kg) pre-dialysis 07/17/12 2304 162 lbs 0.6 oz (73.5 kg) post-dialysis 07/17/12 0416 147 lb 11.3 oz    (67.0  kg) 07/15/12 0942 160 lb                 (72.6 kg)  General: lying in bed, resting, NAD, Right sided tunneled dialysis catheter in place. HEENT: pupils equal round and reactive to light, vision grossly intact, oropharynx clear and non-erythematous, minimal crusted blood noted below nares, nasal packing in place Neck: supple, no lymphadenopathy Lungs: clear to ascultation bilaterally, normal work of respiration, no wheezes, rales, ronchi Heart: regular rate and rhythm, no murmurs, gallops, or rubs Abdomen: soft, non-tender, non-distended, normal bowel sounds Extremities: 1+ bilateral pitting edema Neurologic: alert & oriented X3, cranial nerves II-XII intact, strength grossly intact,  sensation intact to light touch   Lab Results: Basic Metabolic Panel:  Lab [id number] 0910 07/18/12 0756  NA 138 140  K 3.6 4.3  CL 103 105  CO2 25 18*  GLUCOSE 85 102*  BUN 58* 101*  CREATININE 7.87* 10.86*  CALCIUM 7.5* 6.5*  MG 1.9 --  PHOS 5.0* 6.8*     Ref. Range 06/24/2009 05:42 10/21/2010 14:20 07/15/2012 18:06  PTH Latest Range: 14.0-72.0 pg/mL 213.7 (H) 317.3 (H) 684.9 (H)    Liver Function Tests:  Lab 07/19/12 0910 07/18/12 0756 07/16/12 1321 07/15/12 1007  AST -- -- 14 20  ALT -- -- 13 17  ALKPHOS -- -- 49 53  BILITOT -- -- 0.1* 0.1*  PROT -- -- 5.1* 5.2*  ALBUMIN 2.3* 2.3* -- --   CBC:  Lab 07/19/12 0910 07/17/12 0520 07/15/12 1007  WBC 7.4 10.5 --  NEUTROABS -- -- 4.1  HGB 7.3* 7.6* --  HCT 21.6* 22.0* --  MCV 86.4 84.6 --  PLT 172 239 --   Cardiac Enzymes:  Lab 07/16/12 1320 07/16/12 0410 07/15/12 2146  CKTOTAL 520* 585* 569*  CKMB 10.7* 11.6* 10.1*  CKMBINDEX -- -- --  TROPONINI <0.30 <0.30 <0.30   BNP:  Lab 07/15/12 1008  PROBNP 31955.0*     Lab 07/17/12 0520 07/15/12 1806  LABPROT 14.4 16.9*  INR 1.10 1.35   Anemia Panel:  Lab 07/15/12 2144 07/15/12 1806  VITAMINB12 489 --  FOLATE 7.1 --  FERRITIN -- 27  TIBC -- 337  IRON -- 57  RETICCTPCT 2.6 --   Urine Drug Screen: Drugs of Abuse     Component Value Date/Time   LABOPIA NONE DETECTED 07/15/2012 2328   COCAINSCRNUR POSITIVE* 07/15/2012 2328   LABBENZ NONE DETECTED 07/15/2012 2328   AMPHETMU NONE DETECTED 07/15/2012 2328   THCU POSITIVE* 07/15/2012 2328   LABBARB NONE DETECTED 07/15/2012 2328    Urinalysis:  Lab 07/15/12 2328  COLORURINE YELLOW  LABSPEC 1.015  PHURINE 5.0  GLUCOSEU NEGATIVE  HGBUR NEGATIVE  BILIRUBINUR NEGATIVE  KETONESUR NEGATIVE  PROTEINUR NEGATIVE  UROBILINOGEN 0.2  NITRITE NEGATIVE  LEUKOCYTESUR NEGATIVE    Studies/Results: Dg Chest 2 View  07/15/2012  *RADIOLOGY REPORT*  Clinical Data: Epistaxis  CHEST - 2 VIEW  Comparison: 03/10/2011 10/20/2010   Findings: Stable moderate cardiomegaly.  Thoracic aorta contour is normal.  The lungs are clear.  Negative for edema, airspace disease, or pleural effusion.  No acute or suspicious bony abnormality.  Visualized upper abdomen is normal.  IMPRESSION: Stable moderate cardiomegaly with mild pulmonary vascular congestion.  The lungs are clear.  Original Report Authenticated By: Curlene Dolphin, M.D.   Ct Head Wo Contrast  07/15/2012  *RADIOLOGY REPORT*  Clinical Data: 46 year old male with headache and epistaxis.  CT HEAD WITHOUT CONTRAST  Technique:  Contiguous axial images were obtained from the base of the skull through the vertex without contrast.  Comparison: 07/14/2012  Findings: No intracranial abnormalities are identified, including mass lesion or mass effect, hydrocephalus, extra-axial fluid collection, midline shift, hemorrhage, or acute infarction.  The visualized bony calvarium is unremarkable. High attenuation fluid in the nasal cavity and paranasal sinuses noted compatible with blood.  IMPRESSION: No evidence of intracranial quality.  Blood in the nasal cavity and paranasal sinuses.  Original Report Authenticated By: Lura Em, M.D.   US Renal  07/15/2012  *RADIOLOGY REPORT*  Clinical Data: 46 year old male with acute on chronic renal failure.  RENAL/URINARY TRACT ULTRASOUND COMPLETE  Comparison:  10/21/2010 ultrasound  Findings:  Right Kidney:  The right kidney is small and echogenic measuring 6 cm in greatest longitudinal dimension.  There is no evidence of solid mass, hydronephrosis or definite renal calculi.  Left Kidney:  The left kidney is small and echogenic measuring 7.5 cm in greatest longitudinal dimension.  There is no evidence of hydronephrosis, solid renal mass or definite renal calculi.  Bladder:  The bladder is normal for degree of filling.  IMPRESSION: Small echogenic kidneys compatible with chronic medical renal disease.  No evidence of hydronephrosis.  Original Report Authenticated  By: Lura Em, M.D.   Medications: Scheduled Meds:    . acetaminophen      . amLODipine  10 mg Oral Daily  . calcium acetate  1,334 mg Oral TID WC  . carvedilol  25 mg Oral BID WC  . cephALEXin  500 mg Oral Q12H  . darbepoetin (ARANESP) injection - DIALYSIS  100 mcg Intravenous Q Sat-HD  . ferric gluconate (FERRLECIT/NULECIT) IV  125 mg Intravenous Q T,Th,Sa-HD  . ferric gluconate (FERRLECIT/NULECIT) IV  125 mg Intravenous Q Thu-HD  . folic acid  1 mg Oral Daily  . isosorbide mononitrate  60 mg Oral Daily  . LORazepam  0-4 mg Oral Q12H  . multivitamin with minerals  1 tablet Oral Daily  . oxymetazoline  2  spray Each Nare BID  . pantoprazole  40 mg Oral Q1200  . paricalcitol  2 mcg Intravenous Q T,Th,Sa-HD  . sodium chloride  3 mL Intravenous Q12H  . thiamine  100 mg Oral Daily   Or  . thiamine  100 mg Intravenous Daily  . DISCONTD: amLODipine  5 mg Oral Daily  . DISCONTD: carvedilol  12.5 mg Oral BID WC   Continuous Infusions:  PRN Meds:.acetaminophen, acetaminophen, albuterol, cloNIDine, hydrALAZINE, HYDROcodone-acetaminophen, morphine injection, ondansetron (ZOFRAN) IV, ondansetron, senna-docusate, sorbitol  Assessment/Plan:  1. ESRD: Most likely secondary to HTN (poor medication compliance and cocaine use). He had hypervolemic status on admission as evidenced by CXR and leg edema and SOB. Renal US showed no hydronephrosis and bilaterally small shrunken kidneys. Protein/cre ratio is slightly elevated at 0.16 (normal <0.15). HBsAg and HBsAb are negative.  -s/p R IJ HD catheter 8/3, plan for AV fistula placement 8/7 -s/p HD 8/3, 8/4 and 8/5.  -CLIP process has been started - has no Medicaid or insurance - will need emergency Medicaid application -lasix has been discontinued -Plan next HD Wed.  2. Epistaxis: Resolved. Probably associated with hypertension but also in part from dysfunctional platelets of advanced renal dysfunction/uremia and use of crack cocaine.  -s/p  nasal packing and bilateral sphenopalatine artery cauterization by ENT 8/1. -per ENT, nasal packs should stay in place for 7 days (do not remove before 8/8) -on keflex while nasal packs in place -DDAVP was given (8/1).   3. Hypertensive urgency: bp was 171/21 on admission. Currently on Imdur, amlodipine, carvedilol, and prn IV hydralazine.  -Per primary team.   4. Anemia of chronic kidney disease complicated by acute blood loss anemia from epistaxis: both iron and ferritin are at low normal range. It is likely due to iron deficiency. Vitamin B12 normal.  -S/p transfusion 1 unit (8/2) -S/p aranesp 100 (8/3) and ferric gluconate 125 mg (8/3)   Lab 07/19/12 0910 07/17/12 0520 07/16/12 2013 07/16/12 1321 07/16/12 0410  HGB 7.3* 7.6* 7.4* 6.9* 7.6*   5. 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  He has an outpt spot at [facility] at 11:30am.  Will plan for HD today and transfuse.  For access in am and then possibly home. Jinan Biggins,Nicoles T,MD 07/20/2012 11:04 AM

## 2012-07-20 NOTE — Progress Notes (Signed)
[facility]  Based on repeat vein mapping on L arm, pt has an adequate brachial vein for a staged brachial vein transposition.  Adele Barthel, MD [facility] [place] Office: [phone] Pager: [phone]  07/20/2012, 11:54 AM

## 2012-07-20 NOTE — Procedures (Signed)
Pt seen on HD.  Ap 260 Vp 80 will reverse the lines.  To get two units PRBC's.  K 3.5 on 4 K bath

## 2012-07-21 ENCOUNTER — Inpatient Hospital Stay (HOSPITAL_COMMUNITY): Payer: Medicaid Other

## 2012-07-21 ENCOUNTER — Inpatient Hospital Stay (HOSPITAL_COMMUNITY): Payer: Medicaid Other | Admitting: Anesthesiology

## 2012-07-21 ENCOUNTER — Encounter (HOSPITAL_COMMUNITY): Payer: Self-pay | Admitting: Anesthesiology

## 2012-07-21 ENCOUNTER — Encounter (HOSPITAL_COMMUNITY)
Admission: EM | Disposition: A | Discharge status: Home / Self Care | Payer: Self-pay | Source: Home / Self Care | Attending: Internal Medicine

## 2012-07-21 DIAGNOSIS — N186 End stage renal disease: Secondary | ICD-10-CM

## 2012-07-21 LAB — CBC
Platelets: 140 10*3/uL — ABNORMAL LOW (ref 150–400)
RBC: 3.71 MIL/uL — ABNORMAL LOW (ref 4.22–5.81)
RDW: 17.1 % — ABNORMAL HIGH (ref 11.5–15.5)
WBC: 10.1 10*3/uL (ref 4.0–10.5)

## 2012-07-21 LAB — URINALYSIS, ROUTINE W REFLEX MICROSCOPIC
Bilirubin Urine: NEGATIVE
Nitrite: NEGATIVE
Specific Gravity, Urine: 1.012 (ref 1.005–1.030)
Urobilinogen, UA: 0.2 mg/dL (ref 0.0–1.0)
pH: 7 (ref 5.0–8.0)

## 2012-07-21 LAB — TYPE AND SCREEN
Antibody Screen: NEGATIVE
Unit division: 0

## 2012-07-21 LAB — RENAL FUNCTION PANEL
Albumin: 2.5 g/dL — ABNORMAL LOW (ref 3.5–5.2)
BUN: 17 mg/dL (ref 6–23)
Chloride: 104 mEq/L (ref 96–112)
GFR calc Af Amer: 15 mL/min — ABNORMAL LOW (ref >90)
GFR calc non Af Amer: 13 mL/min — ABNORMAL LOW (ref >90)
Phosphorus: 3.1 mg/dL (ref 2.3–4.6)
Potassium: 3.8 mEq/L (ref 3.5–5.1)
Sodium: 140 mEq/L (ref 135–145)

## 2012-07-21 SURGERY — TRANSPOSITION, VEIN, BASILIC
Anesthesia: General | Site: Arm Upper | Laterality: Left | Wound class: Clean

## 2012-07-21 SURGERY — EVACUATION HEMATOMA
Anesthesia: General | Laterality: Right

## 2012-07-21 MED ORDER — CALCIUM ACETATE 667 MG PO CAPS: 1334.0000 mg | ORAL_CAPSULE | Freq: Three times a day (TID) | ORAL | Status: DC

## 2012-07-21 MED ORDER — EPHEDRINE SULFATE 50 MG/ML IJ SOLN
INTRAMUSCULAR | Status: DC | PRN
  Administered 2012-07-21 (×2): 10 mg via INTRAVENOUS
  Administered 2012-07-21: 5 mg via INTRAVENOUS
  Administered 2012-07-21: 10 mg via INTRAVENOUS

## 2012-07-21 MED ORDER — 0.9 % SODIUM CHLORIDE (POUR BTL) OPTIME
TOPICAL | Status: DC | PRN
  Administered 2012-07-21: 1000 mL

## 2012-07-21 MED ORDER — ISOSORBIDE MONONITRATE ER 60 MG PO TB24: 60.0000 mg | ORAL_TABLET | Freq: Every day | ORAL | Status: DC

## 2012-07-21 MED ORDER — CARVEDILOL 25 MG PO TABS
37.5000 mg | ORAL_TABLET | Freq: Two times a day (BID) | ORAL | Status: DC
  Administered 2012-07-21: 37.5 mg via ORAL
  Filled 2012-07-21 (×4): qty 1

## 2012-07-21 MED ORDER — ARTIFICIAL TEARS OP OINT
TOPICAL_OINTMENT | OPHTHALMIC | Status: DC | PRN
  Administered 2012-07-21: 1 via OPHTHALMIC

## 2012-07-21 MED ORDER — HYDRALAZINE HCL 50 MG PO TABS: 50.0000 mg | ORAL_TABLET | Freq: Three times a day (TID) | ORAL | Status: DC

## 2012-07-21 MED ORDER — SODIUM CHLORIDE 0.9 % IR SOLN
Status: DC | PRN
  Administered 2012-07-21: 10:00:00

## 2012-07-21 MED ORDER — CEFAZOLIN SODIUM 1-5 GM-% IV SOLN
INTRAVENOUS | Status: AC
  Filled 2012-07-21: qty 50

## 2012-07-21 MED ORDER — LIDOCAINE HCL 1 % IJ SOLN
INTRAMUSCULAR | Status: DC | PRN
  Administered 2012-07-21: 100 mg via INTRADERMAL

## 2012-07-21 MED ORDER — OXYCODONE-ACETAMINOPHEN 5-325 MG PO TABS
1.0000 | ORAL_TABLET | ORAL | Status: DC | PRN
  Filled 2012-07-21: qty 1

## 2012-07-21 MED ORDER — CEPHALEXIN 500 MG PO CAPS: 500.0000 mg | ORAL_CAPSULE | Freq: Two times a day (BID) | ORAL | Status: DC

## 2012-07-21 MED ORDER — FENTANYL CITRATE 0.05 MG/ML IJ SOLN
INTRAMUSCULAR | Status: DC | PRN
  Administered 2012-07-21: 100 ug via INTRAVENOUS
  Administered 2012-07-21: 50 ug via INTRAVENOUS

## 2012-07-21 MED ORDER — HYDROCODONE-ACETAMINOPHEN 5-325 MG PO TABS: 1.0000 | ORAL_TABLET | Freq: Four times a day (QID) | ORAL | Status: AC | PRN

## 2012-07-21 MED ORDER — HYDROMORPHONE HCL PF 1 MG/ML IJ SOLN
0.2500 mg | INTRAMUSCULAR | Status: DC | PRN
  Administered 2012-07-21: 0.5 mg via INTRAVENOUS

## 2012-07-21 MED ORDER — SODIUM CHLORIDE 0.9 % IV SOLN
INTRAVENOUS | Status: DC | PRN
  Administered 2012-07-21 (×2): via INTRAVENOUS

## 2012-07-21 MED ORDER — CEFAZOLIN SODIUM 1-5 GM-% IV SOLN
INTRAVENOUS | Status: DC | PRN
  Administered 2012-07-21: 1 g via INTRAVENOUS

## 2012-07-21 MED ORDER — CARVEDILOL 12.5 MG PO TABS: 25.0000 mg | ORAL_TABLET | Freq: Two times a day (BID) | ORAL | Status: DC

## 2012-07-21 MED ORDER — SUCCINYLCHOLINE CHLORIDE 20 MG/ML IJ SOLN
INTRAMUSCULAR | Status: DC | PRN
  Administered 2012-07-21: 100 mg via INTRAVENOUS

## 2012-07-21 MED ORDER — THIAMINE HCL 100 MG PO TABS: 100.0000 mg | ORAL_TABLET | Freq: Every day | ORAL | Status: DC

## 2012-07-21 MED ORDER — AMLODIPINE BESYLATE 10 MG PO TABS: 10.0000 mg | ORAL_TABLET | Freq: Every day | ORAL | Status: DC

## 2012-07-21 MED ORDER — CLONIDINE HCL 0.1 MG PO TABS: 0.1000 mg | ORAL_TABLET | Freq: Two times a day (BID) | ORAL | Status: DC

## 2012-07-21 MED ORDER — MIDAZOLAM HCL 5 MG/5ML IJ SOLN
INTRAMUSCULAR | Status: DC | PRN
  Administered 2012-07-21: 2 mg via INTRAVENOUS

## 2012-07-21 MED ORDER — PROPOFOL 10 MG/ML IV EMUL
INTRAVENOUS | Status: DC | PRN
  Administered 2012-07-21: 130 mg via INTRAVENOUS

## 2012-07-21 SURGICAL SUPPLY — 37 items
CANISTER SUCTION 2500CC (MISCELLANEOUS) ×2 IMPLANT
CLIP TI MEDIUM 6 (CLIP) ×2 IMPLANT
CLIP TI WIDE RED SMALL 6 (CLIP) ×2 IMPLANT
CLOTH BEACON ORANGE TIMEOUT ST (SAFETY) ×2 IMPLANT
COVER PROBE W GEL 5X96 (DRAPES) IMPLANT
COVER SURGICAL LIGHT HANDLE (MISCELLANEOUS) ×2 IMPLANT
DECANTER SPIKE VIAL GLASS SM (MISCELLANEOUS) ×2 IMPLANT
DERMABOND ADVANCED (GAUZE/BANDAGES/DRESSINGS) ×1
DERMABOND ADVANCED .7 DNX12 (GAUZE/BANDAGES/DRESSINGS) ×1 IMPLANT
DRAIN PENROSE 1/2X12 LTX STRL (WOUND CARE) IMPLANT
ELECT REM PT RETURN 9FT ADLT (ELECTROSURGICAL) ×2
ELECTRODE REM PT RTRN 9FT ADLT (ELECTROSURGICAL) ×1 IMPLANT
GLOVE BIO SURGEON STRL SZ7 (GLOVE) ×4 IMPLANT
GLOVE BIOGEL PI IND STRL 6.5 (GLOVE) ×1 IMPLANT
GLOVE BIOGEL PI IND STRL 7.5 (GLOVE) ×4 IMPLANT
GLOVE BIOGEL PI INDICATOR 6.5 (GLOVE) ×1
GLOVE BIOGEL PI INDICATOR 7.5 (GLOVE) ×4
GLOVE SS BIOGEL STRL SZ 7 (GLOVE) ×1 IMPLANT
GLOVE SUPERSENSE BIOGEL SZ 7 (GLOVE) ×1
GLOVE SURG SS PI 7.5 STRL IVOR (GLOVE) ×4 IMPLANT
GOWN STRL NON-REIN LRG LVL3 (GOWN DISPOSABLE) ×4 IMPLANT
KIT BASIN OR (CUSTOM PROCEDURE TRAY) ×2 IMPLANT
KIT ROOM TURNOVER OR (KITS) ×2 IMPLANT
NS IRRIG 1000ML POUR BTL (IV SOLUTION) ×2 IMPLANT
PACK CV ACCESS (CUSTOM PROCEDURE TRAY) ×2 IMPLANT
PAD ARMBOARD 7.5X6 YLW CONV (MISCELLANEOUS) ×4 IMPLANT
SPONGE SURGIFOAM ABS GEL 100 (HEMOSTASIS) IMPLANT
SUT MNCRL AB 4-0 PS2 18 (SUTURE) ×2 IMPLANT
SUT PROLENE 6 0 BV (SUTURE) ×2 IMPLANT
SUT PROLENE 7 0 BV 1 (SUTURE) ×4 IMPLANT
SUT SILK 2 0 SH (SUTURE) ×2 IMPLANT
SUT VIC AB 3-0 SH 27 (SUTURE) ×1
SUT VIC AB 3-0 SH 27X BRD (SUTURE) ×1 IMPLANT
TOWEL OR 17X24 6PK STRL BLUE (TOWEL DISPOSABLE) ×2 IMPLANT
TOWEL OR 17X26 10 PK STRL BLUE (TOWEL DISPOSABLE) ×2 IMPLANT
UNDERPAD 30X30 INCONTINENT (UNDERPADS AND DIAPERS) ×2 IMPLANT
WATER STERILE IRR 1000ML POUR (IV SOLUTION) ×2 IMPLANT

## 2012-07-21 NOTE — Anesthesia Postprocedure Evaluation (Signed)
  Anesthesia Post-op Note  Patient: [name] [name]  Procedure(s) Performed: Procedure(s) (LRB): BASCILIC VEIN TRANSPOSITION (Left)  Patient Location: PACU  Anesthesia Type: General  Level of Consciousness: awake  Airway and Oxygen Therapy: Patient Spontanous Breathing  Post-op Pain: mild  Post-op Assessment: Post-op Vital signs reviewed  Post-op Vital Signs: Reviewed  Complications: No apparent anesthesia complications

## 2012-07-21 NOTE — Progress Notes (Signed)
   CARE MANAGEMENT NOTE 07/21/2012  Patient:  [name] [name],[name] [name]   Account Number:  [id number]  Date Initiated:  07/16/2012  Documentation initiated by:  MAYO,HENRIETTA  Subjective/Objective Assessment:   46 yr-old male with h/o polysubstance abuse and noncompliance adm with dx of ARF, lives with mother and dad, independent PTA.     Action/Plan:   Anticipated DC Date:     Anticipated DC Plan:    In-house referral  Financial Counselor  Clinical Social Worker         Choice offered to / List presented to:             Status of service:  In process, will continue to follow Medicare Important Message given?   (If response is "NO", the following Medicare IM given date fields will be blank) Date Medicare IM given:   Date Additional Medicare IM given:    Discharge Disposition:    Per UR Regulation:    If discussed at Long Length of Stay Meetings, dates discussed:    Comments:  Primary care @ [facility]  07/21/2012  [place], [place] Met with patient and mother regarding discharge planning. Provided phone contact information for [facility] on [address] for medical follow up post discharge. Advised copay $45, and $3 when Medicaid active. Talked about the cost of medications at [place]. Mom said could manage the costs. Suggested they follow up regarding the Medicaid application process.  07-19-12 [address] Jacqlyn Krauss, RN,BSN [phone] CM will continue to [place] for disposition needs.   07/16/12 [place] MSN CCM Pt sleeping.  Per mother, financial counselor called and will bring applications to room for completion.  Discussed PCP issue, pt will have options for primary care when he receives MCD pending #.   Vascular surgery to place HD access.

## 2012-07-21 NOTE — Preoperative (Signed)
Beta Blockers   Reason not to administer Beta Blockers:Carvedilol 07/21/12 [phone]

## 2012-07-21 NOTE — Anesthesia Procedure Notes (Signed)
Procedure Name: Intubation Date/Time: 07/21/2012 9:20 AM Performed by: Luane School A Pre-anesthesia Checklist: Patient identified Patient Re-evaluated:Patient Re-evaluated prior to inductionOxygen Delivery Method: Circle system utilized Preoxygenation: Pre-oxygenation with 100% oxygen Intubation Type: IV induction and Cricoid Pressure applied Ventilation: Mask ventilation without difficulty Laryngoscope Size: Miller and 3 Grade View: Grade I Tube type: Oral Tube size: 7.5 mm Number of attempts: 1 Airway Equipment and Method: Stylet Placement Confirmation: ETT inserted through vocal cords under direct vision,  positive ETCO2 and breath sounds checked- equal and bilateral Secured at: 21 cm Tube secured with: Tape Dental Injury: Teeth and Oropharynx as per pre-operative assessment

## 2012-07-21 NOTE — Progress Notes (Signed)
[facility] PROGRESS NOTE  [name] [name] [id number] DOB: March 10, 1966 DOA: 07/15/2012 PCP: Pcp Not In System  Assessment/Plan:  Principal Problem:  *Acute on chronic renal failure/New ESRD: likely progression of his chronic kidney disease due to non-compliance with anti-hypertensive regimen  - Renal service following, renal ultrasound showed no hydronephrosis and bilateral small shrunken kidneys.  - Hemodialysis initiated per renal recommendations. Plans for dialysis on the first shift on 07/22/2012. He is scheduled for outpatient dialysis on Tuesday, Thursday, and Saturdays. - Access/Fistula placement on 07/21/2012, vascular surgery following  - Discussed with Dr. Mercy Moore today, they will schedule patient on Tues-Thurs-Sat out-patient schedule, so HD again today.  Epistaxis: Likely secondary to hypertension, advanced renal dysfunction/uremia and use of crack cocaine  - Resolved after acute management by ENT, nasal packing and cauterization  - DDAVP was given 07/15/2012, currently no active bleeding  - Discussed with Dr. Theressa Millard office, nasal packing will be removed outpatient in office on 07/22/2012 at 3 PM. I have placed the appointment time in the discharge AVS, please notify Dr. Theressa Millard office if the discharge is delayed for some reason to coordinate the removal. It needs to be removed postop days #7).  Abdominal discomfort-noted on 07/22/2012. -The patient has sensations of urine retention. Urinalysis and urine culture were ordered. 2 view abdominal x-ray was also ordered. Morning labs including BMP and CBC. Accelerated hypertension: Still not well controlled,, but gradually improving.  - Added clonidine, continue Coreg to 25 mg BID, Norvasc 10 mg, continue Imdur  Acute blood loss anemia secondary to epistaxis with anemia of chronic kidney disease, hemoglobin still low 7.3 but no active bleeding  - Status post transfusion 1 unit on 8/2, started on Aranesp, iron, 2 units today with  hemodialysis  History of ALCOHOL ABUSE/ TOBACCO ABUSE/ COCAINE ABUSE  - Urine drug screen was positive for cocaine and marijuana, currently on Ativan withdrawal protocol, stable  Mildly Elevated troponin: Probably secondary to renal failure, further troponins negative, asymptomatic  Chronic CHF: Systolic, current echo on 07/16/2012 showed EF of [id number], grade 2 diastolic dysfunction  DVT Prophylaxis: SCDs  Brief narrative: Today, the patient complained of some urine retention with suprapubic discomfort after returning from surgery for his AV graft. He denies any nausea, vomiting, chest pain, shortness of breath, other abdominal pain. Discharge was held for 07/21/12.  46 year old, African American male, with a past medical history of Hypertension, chronic kidney disease, who is noncompliant, admitted with epistaxis, stabilized after acute management by ENT, currently with nasal packing after cauterization. Started on hemodialysis, new, due to progression of his chronic kidney disease, uremia and accelerated hypertension. He has been previously noncompliant with renal. Access placement is scheduled for Wednesday, 07/21/2012. The plan is to schedule his outpatient hemodialysis on Tuesday, Thursday, Saturday after the CLIP process is complete. Patient will have the nasal packing removed by Dr. Simeon Craft in office on 07/22/2012 at 3:00PM. (Please notify Dr. Theressa Millard office if the patient's discharge is delayed for some reason).   Principal Problem:  *Acute on chronic renal failure Active Problems:  ALCOHOL ABUSE  TOBACCO ABUSE  COCAINE ABUSE  CHRONIC KIDNEY DISEASE STAGE IV (SEVERE)  Epistaxis  Anemia due to blood loss, acute  Accelerated hypertension  Elevated troponin  Metabolic acidosis  Hypocalcemia   Consultants: Nephrology - Dr Mercy Moore  Vascular - Dr Bridgett Larsson  ENT- Dr Ruby Cola    Procedures/Studies:  AVG 07/21/12 by Dr. Bridgett Larsson  Antibiotics:  Keflex  Code Status: Full Family  Communication: I spoke to his mother and  father at the bedside today the Disposition Plan: Home when medically stable  HPI/Subjective: No events overnight.   Objective: Filed Vitals:   07/21/12 1215 07/21/12 1230 07/21/12 1245 07/21/12 1314  BP: 141/111 151/107 147/104 163/113  Pulse: 64 64 64 68  Temp:   98 F (36.7 C) 98.1 F (36.7 C)  TempSrc:    Oral  Resp: 13 10 12 16   Height:      Weight:      SpO2: 98% 98% 99% 99%    Intake/Output Summary (Last 24 hours) at 07/21/12 1353 Last data filed at 07/21/12 1053  Gross per 24 hour  Intake   1650 ml  Output   3518 ml  Net  -1868 ml    Exam:   General:  Pt is alert, follows commands appropriately, not in acute distress  Cardiovascular: Regular rate and rhythm, S1/S2, no murmurs, no rubs, no gallops  Respiratory: Clear to auscultation bilaterally, no wheezing, no crackles, no rhonchi  Abdomen: Soft, non tender, non distended, bowel sounds present, no guarding  Extremities: No edema, pulses DP and PT palpable bilaterally  Neuro: Grossly nonfocal  Data Reviewed: Basic Metabolic Panel:  Lab [id number] 0515 07/20/12 0920 07/19/12 0910 07/18/12 0756 07/17/12 0520  NA 140 139 138 140 135  K 3.8 3.5 3.6 4.3 3.4*  CL 104 103 103 105 100  CO2 26 29 25  18* 17*  GLUCOSE 87 135* 85 102* 115*  BUN 17 31* 58* 101* 126*  CREATININE 4.88* 5.87* 7.87* 10.86* 12.07*  CALCIUM 7.5* 7.5* 7.5* 6.5* 5.6*  MG -- -- 1.9 -- --  PHOS 3.1 3.6 5.0* 6.8* 7.8*   Liver Function Tests:  Lab 07/21/12 0515 07/20/12 0920 07/19/12 0910 07/18/12 0756 07/17/12 0520 07/16/12 1321 07/15/12 1007  AST -- -- -- -- -- 14 20  ALT -- -- -- -- -- 13 17  ALKPHOS -- -- -- -- -- 49 53  BILITOT -- -- -- -- -- 0.1* 0.1*  PROT -- -- -- -- -- 5.1* 5.2*  ALBUMIN 2.5* 2.4* 2.3* 2.3* 2.4* -- --   No results found for this basename: LIPASE:5,AMYLASE:5 in the last 168 hours No results found for this basename: AMMONIA:5 in the last 168 hours CBC:  Lab 07/21/12  0515 07/19/12 0910 07/17/12 0520 07/16/12 2013 07/16/12 1321 07/15/12 1007  WBC 10.1 7.4 10.5 12.6* 10.7* --  NEUTROABS -- -- -- -- -- 4.1  HGB 10.6* 7.3* 7.6* 7.4* 6.9* --  HCT 32.1* 21.6* 22.0* 21.4* 19.9* --  MCV 86.5 86.4 84.6 83.6 84.7 --  PLT 140* 172 239 241 265 --   Cardiac Enzymes:  Lab 07/16/12 1320 07/16/12 0410 07/15/12 2146  CKTOTAL 520* 585* 569*  CKMB 10.7* 11.6* 10.1*  CKMBINDEX -- -- --  TROPONINI <0.30 <0.30 <0.30   BNP: No components found with this basename: POCBNP:5 CBG: No results found for this basename: GLUCAP:5 in the last 168 hours  No results found for this or any previous visit (from the past 240 hour(s)).   Scheduled Meds:   . amLODipine  10 mg Oral Daily  . calcium acetate  1,334 mg Oral TID WC  . carvedilol  37.5 mg Oral BID WC  . cephALEXin  500 mg Oral Q12H  . cloNIDine  0.1 mg Oral BID  . darbepoetin (ARANESP) injection - DIALYSIS  100 mcg Intravenous Q Sat-HD  . ferric gluconate (FERRLECIT/NULECIT) IV  125 mg Intravenous Q T,Th,Sa-HD  . ferric gluconate (FERRLECIT/NULECIT) IV  125 mg Intravenous  Q Thu-HD  . folic acid  1 mg Oral Daily  . isosorbide mononitrate  60 mg Oral Daily  . multivitamin with minerals  1 tablet Oral Daily  . pantoprazole  40 mg Oral Q1200  . paricalcitol  2 mcg Intravenous Q T,Th,Sa-HD  . sodium chloride  3 mL Intravenous Q12H  . thiamine  100 mg Oral Daily   Or  . thiamine  100 mg Intravenous Daily  . DISCONTD: carvedilol  25 mg Oral BID WC   Continuous Infusions:    Annabella Elford, MD  [facility] Pager [phone]  If 7PM-7AM, please contact night-coverage [email or web address] Password [email or web address] 07/21/2012, 1:53 PM   LOS: 6 days

## 2012-07-21 NOTE — H&P (Signed)
[facility] [place]  History and Physical Update  The patient was interviewed and re-examined.  The patient's previous History and Physical has been reviewed and is unchanged from my consult on 07/16/12.  Based on his repeat vein mapping, his L brachial vein is his best option for a fistula, brachial vein transposition.  Adele Barthel, MD [facility] [place] Office: [phone] Pager: [phone]  07/21/2012, 8:42 AM

## 2012-07-21 NOTE — Transfer of Care (Signed)
Immediate Anesthesia Transfer of Care Note  Patient: [name] [name]  Procedure(s) Performed: Procedure(s) (LRB): BASCILIC VEIN TRANSPOSITION (Left)  Patient Location: PACU  Anesthesia Type: General  Level of Consciousness: awake, alert , oriented and patient cooperative  Airway & Oxygen Therapy: Patient Spontanous Breathing and Patient connected to face mask oxygen  Post-op Assessment: Report given to PACU RN and Post -op Vital signs reviewed and stable  Post vital signs: Reviewed and stable  Complications: No apparent anesthesia complications

## 2012-07-21 NOTE — Progress Notes (Signed)
Subjective:   Transfused 2 U of blood yesterday.  Patient feels good. No nausea, vomiting, SOB.  Nose bleeding resolved. Will have the nasal packing removed by Dr. Simeon Craft in office on 07/22/2012 at 3:00PM.   Tolerated 4 HD (8/6, 8/5, 8/4, 8/3)  Will get Access/Fistula placement today (07/21/2012).  The patient had a R IJ HD catheter placed 07/17/12  Objective: Vital signs in last 24 hours: Filed Vitals:   07/20/12 1556 07/20/12 1658 07/20/12 2106 07/21/12 0548  BP: 147/88 142/104 148/97 170/93  Pulse: 70 67 73 75  Temp: 97 F (36.1 C) 98.5 F (36.9 C) 98.1 F (36.7 C) 98.4 F (36.9 C)  TempSrc: Oral Oral Oral Oral  Resp: 16 17 17 17   Height:   5\' 9"  (1.753 m)   Weight: 136 lb 3.9 oz (61.8 kg)  136 lb 3.9 oz (61.8 kg)   SpO2:  100% 100% 99%   Weight change: -14 lb 1.8 oz (-6.4 kg)  Intake/Output Summary (Last 24 hours) at 07/21/12 0755 Last data filed at 07/20/12 1556  Gross per 24 hour  Intake   1290 ml  Output   4118 ml  Net  -2828 ml   Urine 1250 (8/2); 2900 (8/3); 1400 (8/4), 600 (8/6) Body weight:  07/21/12  07/20/12 2106 136 lb 3.9 oz (61.8 kg) 07/19/12 2137 146 lb 2.6 oz (66.3 kg) 07/19/12 1244 145 lb 4.5 oz (65.9 kg) 07/19/12 0840 150 lb 5.7 oz (68.2 kg) 07/18/12 0950 160 lbs 15 oz (73 kg) pre-dialysis 07/17/12 2304 162 lbs 0.6 oz (73.5 kg) post-dialysis 07/17/12 0416 147 lb 11.3 oz    (67.0  kg) 07/15/12 0942 160 lb                 (72.6 kg)  General: lying in bed, resting, NAD, Right sided tunneled dialysis catheter in place. HEENT: pupils equal round and reactive to light, vision grossly intact, oropharynx clear and non-erythematous, minimal crusted blood noted below nares, nasal packing in place Neck: supple, no lymphadenopathy Lungs: clear to ascultation bilaterally, normal work of respiration, no wheezes, rales, ronchi Heart: regular rate and rhythm, no murmurs, gallops, or rubs Abdomen: soft, non-tender, non-distended, normal bowel  sounds Extremities: Trace amount of leg edema bilaterally. Neurologic: alert & oriented X3, cranial nerves II-XII intact, strength grossly intact, sensation intact to light touch  Lab Results: Basic Metabolic Panel:  Lab [id number] 0515 07/20/12 0920 07/19/12 0910  NA 140 139 --  K 3.8 3.5 --  CL 104 103 --  CO2 26 29 --  GLUCOSE 87 135* --  BUN 17 31* --  CREATININE 4.88* 5.87* --  CALCIUM 7.5* 7.5* --  MG -- -- 1.9  PHOS 3.1 3.6 --     Ref. Range 06/24/2009 05:42 10/21/2010 14:20 07/15/2012 18:06  PTH Latest Range: 14.0-72.0 pg/mL 213.7 (H) 317.3 (H) 684.9 (H)   Liver Function Tests:  Lab 07/21/12 0515 07/20/12 0920 07/16/12 1321 07/15/12 1007  AST -- -- 14 20  ALT -- -- 13 17  ALKPHOS -- -- 49 53  BILITOT -- -- 0.1* 0.1*  PROT -- -- 5.1* 5.2*  ALBUMIN 2.5* 2.4* -- --   CBC:  Lab 07/21/12 0515 07/19/12 0910 07/15/12 1007  WBC 10.1 7.4 --  NEUTROABS -- -- 4.1  HGB 10.6* 7.3* --  HCT 32.1* 21.6* --  MCV 86.5 86.4 --  PLT 140* 172 --   Cardiac Enzymes:  Lab 07/16/12 1320 07/16/12 0410 07/15/12 2146  CKTOTAL 520* 585* 569*  CKMB  10.7* 11.6* 10.1*  CKMBINDEX -- -- --  TROPONINI <0.30 <0.30 <0.30   BNP:  Lab 07/15/12 1008  PROBNP 31955.0*     Lab 07/17/12 0520 07/15/12 1806  LABPROT 14.4 16.9*  INR 1.10 1.35   Anemia Panel:  Lab 07/15/12 2144 07/15/12 1806  VITAMINB12 489 --  FOLATE 7.1 --  FERRITIN -- 27  TIBC -- 337  IRON -- 57  RETICCTPCT 2.6 --   Urine Drug Screen: Drugs of Abuse     Component Value Date/Time   LABOPIA NONE DETECTED 07/15/2012 2328   COCAINSCRNUR POSITIVE* 07/15/2012 2328   LABBENZ NONE DETECTED 07/15/2012 2328   AMPHETMU NONE DETECTED 07/15/2012 2328   THCU POSITIVE* 07/15/2012 2328   LABBARB NONE DETECTED 07/15/2012 2328    Urinalysis:  Lab 07/15/12 2328  COLORURINE YELLOW  LABSPEC 1.015  PHURINE 5.0  GLUCOSEU NEGATIVE  HGBUR NEGATIVE  BILIRUBINUR NEGATIVE  KETONESUR NEGATIVE  PROTEINUR NEGATIVE  UROBILINOGEN 0.2  NITRITE  NEGATIVE  LEUKOCYTESUR NEGATIVE    Studies/Results: Dg Chest 2 View  07/15/2012  *RADIOLOGY REPORT*  Clinical Data: Epistaxis  CHEST - 2 VIEW  Comparison: 03/10/2011 10/20/2010  Findings: Stable moderate cardiomegaly.  Thoracic aorta contour is normal.  The lungs are clear.  Negative for edema, airspace disease, or pleural effusion.  No acute or suspicious bony abnormality.  Visualized upper abdomen is normal.  IMPRESSION: Stable moderate cardiomegaly with mild pulmonary vascular congestion.  The lungs are clear.  Original Report Authenticated By: Curlene Dolphin, M.D.   Ct Head Wo Contrast  07/15/2012  *RADIOLOGY REPORT*  Clinical Data: 46 year old male with headache and epistaxis.  CT HEAD WITHOUT CONTRAST  Technique:  Contiguous axial images were obtained from the base of the skull through the vertex without contrast.  Comparison: 07/14/2012  Findings: No intracranial abnormalities are identified, including mass lesion or mass effect, hydrocephalus, extra-axial fluid collection, midline shift, hemorrhage, or acute infarction.  The visualized bony calvarium is unremarkable. High attenuation fluid in the nasal cavity and paranasal sinuses noted compatible with blood.  IMPRESSION: No evidence of intracranial quality.  Blood in the nasal cavity and paranasal sinuses.  Original Report Authenticated By: Lura Em, M.D.   US Renal  07/15/2012  *RADIOLOGY REPORT*  Clinical Data: 46 year old male with acute on chronic renal failure.  RENAL/URINARY TRACT ULTRASOUND COMPLETE  Comparison:  10/21/2010 ultrasound  Findings:  Right Kidney:  The right kidney is small and echogenic measuring 6 cm in greatest longitudinal dimension.  There is no evidence of solid mass, hydronephrosis or definite renal calculi.  Left Kidney:  The left kidney is small and echogenic measuring 7.5 cm in greatest longitudinal dimension.  There is no evidence of hydronephrosis, solid renal mass or definite renal calculi.  Bladder:  The bladder  is normal for degree of filling.  IMPRESSION: Small echogenic kidneys compatible with chronic medical renal disease.  No evidence of hydronephrosis.  Original Report Authenticated By: Lura Em, M.D.   Medications: Scheduled Meds:    . amLODipine  10 mg Oral Daily  . calcium acetate  1,334 mg Oral TID WC  . carvedilol  25 mg Oral BID WC  . cephALEXin  500 mg Oral Q12H  . cloNIDine  0.1 mg Oral BID  . darbepoetin (ARANESP) injection - DIALYSIS  100 mcg Intravenous Q Sat-HD  . ferric gluconate (FERRLECIT/NULECIT) IV  125 mg Intravenous Q T,Th,Sa-HD  . ferric gluconate (FERRLECIT/NULECIT) IV  125 mg Intravenous Q Thu-HD  . folic acid  1 mg  Oral Daily  . isosorbide mononitrate  60 mg Oral Daily  . multivitamin with minerals  1 tablet Oral Daily  . pantoprazole  40 mg Oral Q1200  . paricalcitol  2 mcg Intravenous Q T,Th,Sa-HD  . sodium chloride  3 mL Intravenous Q12H  . thiamine  100 mg Oral Daily   Or  . thiamine  100 mg Intravenous Daily   Continuous Infusions:  PRN Meds:.acetaminophen, acetaminophen, albuterol, cloNIDine, hydrALAZINE, HYDROcodone-acetaminophen, morphine injection, ondansetron (ZOFRAN) IV, ondansetron, senna-docusate, sorbitol  Assessment/Plan:  1. ESRD: Most likely secondary to HTN (poor medication compliance and cocaine use). He had hypervolemic status on admission as evidenced by CXR and leg edema and SOB. Renal US showed no hydronephrosis and bilaterally small shrunken kidneys. Protein/cre ratio is slightly elevated at 0.16 (normal <0.15). HBsAg and HBsAb are negative.   -s/p R IJ HD catheter 8/3, plan for AV fistula placement today (8/7) -s/p HD 8/3, 8/4, 8/5 and 8/6 -CLIP process has been started - has no Medicaid or insurance - will need emergency Medicaid application -lasix has been discontinued -Plan next HD today  2. Epistaxis: Resolved. Probably associated with hypertension but also in part from dysfunctional platelets of advanced renal  dysfunction/uremia and use of crack cocaine.  -s/p nasal packing and bilateral sphenopalatine artery cauterization by ENT 8/1. -per ENT, nasal packs should stay in place for 7 days (will  remove on  8/8) -on keflex while nasal packs in place -DDAVP was given (8/1).   3. Hypertensive urgency: bp was 171/21 on admission. Currently on Imdur, amlodipine, carvedilol, and prn IV hydralazine.  -Per primary team.   4. Anemia of chronic kidney disease complicated by acute blood loss anemia from epistaxis: both iron and ferritin are at low normal range. It is likely due to iron deficiency. Vitamin B12 normal.  -S/p transfusion of 4 units of blood so far -S/p aranesp 100 (8/3) and ferric gluconate 125 mg (8/3)   Lab 07/21/12 0515 07/19/12 0910 07/17/12 0520 07/16/12 2013 07/16/12 1321  HGB 10.6* 7.3* 7.6* 7.4* 6.9*   5. 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  For AVF today.  BP still high, will increase coreg to 37.5 mg BID.  He could go today from renal standpoint as he has HD set up at [facility].  He should be there tomorrow at 10:30am to do paperwork.  I would not DC on Imdur or protonix. Maritza Goldsborough,Dominik T,MD 07/21/2012 10:32 AM

## 2012-07-21 NOTE — Op Note (Signed)
OPERATIVE NOTE   PROCEDURE: 1. left first stage brachial vein transposition (brachiobrachial arteriovenous fistula) placement  PRE-OPERATIVE DIAGNOSIS: end stage renal disease   POST-OPERATIVE DIAGNOSIS: same as above   SURGEON: Adele Barthel, MD  ASSISTANT(S): Gerri Lins, PAC   ANESTHESIA: general  ESTIMATED BLOOD LOSS: 50 cc  FINDING(S): 1. Small brachial artery: 2.5-3 mm 2. Small brachial vein: 2.5-3 mm 3. Difficult to palpable thrill due to close proximity to brachial artery 4. Dopplerable left radial signal  SPECIMEN(S):  none  INDICATIONS:   [name] [name] is a 46 y.o. male who presents with end stage renal disease .  The patient is scheduled for left first stage brachial vein transposition.  The patient is aware the risks include but are not limited to: bleeding, infection, steal syndrome, nerve damage, ischemic monomelic neuropathy, failure to mature, and need for additional procedures.  The patient is aware of the risks of the procedure and elects to proceed forward.  DESCRIPTION: After full informed written consent was obtained from the patient, the patient was brought back to the operating room and placed supine upon the operating table.  Prior to induction, the patient received IV antibiotics.   After obtaining adequate anesthesia, the patient was then prepped and draped in the standard fashion for a left arm access procedure.  I turned my attention first to identifying the patient's brachial vein and brachial artery.  Using SonoSite guidance, the location of these vessels were marked out on the skin.   I made a longitudinal incision at the level of the antecubitum and dissected through the subcutaneous tissue and fascia to gain exposure of the brachial artery.  This was noted to be 2.5-3.0 mm in diameter externally.  This was dissected out proximally and distally and controlled with vessel loops .  I then dissected out the brachial vein.  This was noted to be 2.5-3.0 mm  in diameter externally.  The distal segment of the vein was ligated with a  2-0 silk, and the vein was transected.  The proximal segment was iinterrogated with serial dilators.  The vein accepted up to a 3 mm dilator without any difficulty.  I then instilled the heparinized saline into the vein and clamped it.  At this point, I reset my exposure of the brachial artery and placed the artery under tension proximally and distally.  I made an arteriotomy with a #11 blade, and then I extended the arteriotomy with a Potts scissor.  I injected heparinized saline proximal and distal to this arteriotomy.  The vein was then sewn to the artery in an end-to-side configuration with a running stitch of 7-0 Prolene.  Prior to completing this anastomosis, I allowed the vein and artery to backbleed.  There was no evidence of clot from any vessels.  I completed the anastomosis in the usual fashion and then released all vessel loops and clamps.  There was a difficult to palpable thrill in the venous outflow, as it was directly adjacent to the brachial artery.  There was more of pulsatile character in the venous outflow.  There was a dopplerable radial signal.  At this point, I irrigated out the surgical wound.  There was no further active bleeding.  The subcutaneous tissue was reapproximated with a running stitch of 3-0 Vicryl.  The skin was then reapproximated with a running subcuticular stitch of 4-0 Vicryl.  The skin was then cleaned, dried, and reinforced with Dermabond.  The patient tolerated this procedure well.   COMPLICATIONS: none  CONDITION: stable  Adele Barthel, MD [facility] Office: [phone] Pager: [phone]  07/21/2012, 10:39 AM

## 2012-07-21 NOTE — Anesthesia Preprocedure Evaluation (Addendum)
Anesthesia Evaluation  Patient identified by MRN, date of birth, ID band Patient awake    Reviewed: Allergy & Precautions, H&P , NPO status , Patient's Chart, lab work & pertinent test results  Airway       Dental   Pulmonary Current Smoker,  Admit with epitaxies--07/15/12  Patient will have the nasal packing removed by Dr. Simeon Craft in office on 07/22/2012 at 3:00PM breath sounds clear to auscultation        Cardiovascular hypertension, Pt. on medications +CHF Rhythm:Regular Rate:Normal  CM   Neuro/Psych PSYCHIATRIC DISORDERS    GI/Hepatic negative GI ROS, (+)     substance abuse  alcohol use and cocaine use,   Endo/Other  negative endocrine ROS  Renal/GU ESRFRenal disease  negative genitourinary   Musculoskeletal negative musculoskeletal ROS (+)   Abdominal   Peds negative pediatric ROS (+)  Hematology negative hematology ROS (+)   Anesthesia Other Findings   Reproductive/Obstetrics                        Anesthesia Physical Anesthesia Plan  ASA: III  Anesthesia Plan: General   Post-op Pain Management:    Induction: Intravenous  Airway Management Planned: LMA  Additional Equipment:   Intra-op Plan:   Post-operative Plan: Extubation in OR  Informed Consent:   Dental advisory given  Plan Discussed with: CRNA, Anesthesiologist and Surgeon  Anesthesia Plan Comments:        Anesthesia Quick Evaluation

## 2012-07-21 NOTE — Progress Notes (Signed)
Report given to kay rn as caregiver 

## 2012-07-21 NOTE — Progress Notes (Signed)
No bruitt heard dr Bridgett Larsson aware

## 2012-07-21 NOTE — Clinical Social Work Note (Signed)
CSW met with patient, with his parents [name] [name] and [name] [name] at the bedside. CSW had patient sign application and advised that it will be reviewed and faxed to the [employer]AT office and contact made with SCAT coordinator Sherria High. Patient requested that CSW follow-up to determine if his SSA disability application will be done before he is discharged. Call made to financial counselor and CSW advised that patient will be contacted to come back to [facility] to complete disability application. Contact numbers given to financial counselor.  Brihany Butch Givens, MSW, LCSW [phone]

## 2012-07-22 ENCOUNTER — Inpatient Hospital Stay (HOSPITAL_COMMUNITY): Payer: Medicaid Other

## 2012-07-22 ENCOUNTER — Telehealth: Payer: Self-pay | Admitting: Vascular Surgery

## 2012-07-22 LAB — RENAL FUNCTION PANEL
CO2: 26 mEq/L (ref 19–32)
Calcium: 7.3 mg/dL — ABNORMAL LOW (ref 8.4–10.5)
Creatinine, Ser: 6.42 mg/dL — ABNORMAL HIGH (ref 0.50–1.35)
Glucose, Bld: 86 mg/dL (ref 70–99)

## 2012-07-22 LAB — CBC WITH DIFFERENTIAL/PLATELET
Basophils Relative: 1 % (ref 0–1)
Eosinophils Absolute: 0.4 10*3/uL (ref 0.0–0.7)
HCT: 28.6 % — ABNORMAL LOW (ref 39.0–52.0)
Hemoglobin: 9.4 g/dL — ABNORMAL LOW (ref 13.0–17.0)
MCH: 28.6 pg (ref 26.0–34.0)
MCHC: 32.9 g/dL (ref 30.0–36.0)
MCV: 86.9 fL (ref 78.0–100.0)
Monocytes Absolute: 1.6 10*3/uL — ABNORMAL HIGH (ref 0.1–1.0)
Monocytes Relative: 18 % — ABNORMAL HIGH (ref 3–12)

## 2012-07-22 LAB — BASIC METABOLIC PANEL
BUN: 26 mg/dL — ABNORMAL HIGH (ref 6–23)
Chloride: 102 mEq/L (ref 96–112)
Creatinine, Ser: 6.47 mg/dL — ABNORMAL HIGH (ref 0.50–1.35)
GFR calc Af Amer: 11 mL/min — ABNORMAL LOW (ref >90)
GFR calc non Af Amer: 9 mL/min — ABNORMAL LOW (ref >90)

## 2012-07-22 LAB — URINE CULTURE
Colony Count: NO GROWTH
Culture: NO GROWTH

## 2012-07-22 MED ORDER — PARICALCITOL 5 MCG/ML IV SOLN
INTRAVENOUS | Status: AC
  Administered 2012-07-22: 2 ug
  Filled 2012-07-22: qty 1

## 2012-07-22 MED ORDER — MUPIROCIN 2 % EX OINT: TOPICAL_OINTMENT | Freq: Three times a day (TID) | CUTANEOUS | Status: AC

## 2012-07-22 MED ORDER — OXYMETAZOLINE HCL 0.05 % NA SOLN
3.0000 | Freq: Two times a day (BID) | NASAL | Status: DC
  Administered 2012-07-22: 3 via NASAL
  Filled 2012-07-22: qty 15

## 2012-07-22 MED ORDER — LISINOPRIL 10 MG PO TABS: 10.0000 mg | ORAL_TABLET | Freq: Every day | ORAL | Status: DC

## 2012-07-22 MED ORDER — CIPROFLOXACIN HCL 250 MG PO TABS: 250.0000 mg | ORAL_TABLET | Freq: Every day | ORAL | Status: AC

## 2012-07-22 MED ORDER — OXYMETAZOLINE HCL 0.05 % NA SOLN: 3.0000 | Freq: Two times a day (BID) | NASAL | Status: AC

## 2012-07-22 MED ORDER — MUPIROCIN 2 % EX OINT
TOPICAL_OINTMENT | Freq: Three times a day (TID) | CUTANEOUS | Status: DC
  Administered 2012-07-22: 15:00:00 via NASAL
  Filled 2012-07-22: qty 22

## 2012-07-22 MED ORDER — CIPROFLOXACIN HCL 250 MG PO TABS
250.0000 mg | ORAL_TABLET | Freq: Once | ORAL | Status: AC
  Administered 2012-07-22: 250 mg via ORAL
  Filled 2012-07-22: qty 1

## 2012-07-22 NOTE — Discharge Summary (Signed)
Physician Discharge Summary  [name] [name] [id number] DOB: Mar 05, 1966 DOA: 07/15/2012  PCP: Pcp Not In System  Admit date: 07/15/2012 Discharge date: 07/22/2012  Recommendations for Outpatient Follow-up:  1. Pt will need to follow up with PCP in 2-3 weeks post discharge 2. Please obtain BMP to evaluate electrolytes and kidney function 3. Please also check CBC to evaluate Hg and Hct levels 4. The patient was given the contact information for [facility] to establish primary care. 5. Followup with Dr. Adele Barthel in 2 weeks status post left brachial vein fistula placement  Discharge Diagnoses:  Principal Problem:  *Acute on chronic renal failure Active Problems:  ALCOHOL ABUSE  TOBACCO ABUSE  COCAINE ABUSE  CHRONIC KIDNEY DISEASE STAGE IV (SEVERE)  Epistaxis  Anemia due to blood loss, acute  Accelerated hypertension  Elevated troponin  Metabolic acidosis  Hypocalcemia    Discharge Condition: Stable  Diet recommendation: Heart healthy diet discussed in details   History of present illness:  46 year old, African American male, with a past medical history of Hypertension, chronic kidney disease, who is noncompliant, admitted with epistaxis, stabilized after acute management by ENT, currently with nasal packing after cauterization. Started on hemodialysis, new, due to progression of his chronic kidney disease, uremia and accelerated hypertension. He has been previously noncompliant with renal. Access placement is scheduled for Wednesday, 07/21/2012. The plan is to schedule his outpatient hemodialysis on Tuesday, Thursday, Saturday after the CLIP process is complete.    Hospital Course:  Principal Problem:  *Acute on chronic renal failure/New ESRD: likely progression of his chronic kidney disease due to non-compliance with anti-hypertensive regimen  - Renal service following, renal ultrasound showed no hydronephrosis and bilateral small shrunken kidneys.  - Hemodialysis  initiated per renal recommendations. Plans for dialysis on the first shift on 07/22/2012. He is scheduled for outpatient dialysis on Tuesday, Thursday, and Saturdays.  - Access/Fistula placement on 07/21/2012 - Discussed with Dr. Mercy Moore today, they will schedule patient on Tues-Thurs-Sat out-patient schedule Epistaxis: Likely secondary to hypertension, advanced renal dysfunction/uremia and use of crack cocaine  - Resolved after acute management by ENT, nasal packing and cauterization  - DDAVP was given 07/15/2012, currently no active bleeding  - Nasal packing was removed in the hospital by Dr. Simeon Craft on August 8. Instructions were provided to use mupirocin x14 days and Afrin x3 days. No recurrence after nasal packing was removed. Abdominal discomfort-noted on 07/22/2012.  -The patient has sensations of urine retention. Urinalysis and urine culture were ordered. 2 view abdominal x-ray was also ordered. Urinalysis suggested pyuria. The patient will be sent home with a 7 day course of ciprofloxacin. abdominal x-ray was negative for acute abnormalities. The patient is tolerating a renal diet. Accelerated hypertension: Still not well controlled,, but gradually improving.  - Added clonidine and lisinopril, continue Coreg to 25 mg BID, Norvasc 10 mg, continue Imdur  Acute blood loss anemia secondary to epistaxis with anemia of chronic kidney disease, hemoglobin still low 7.3 but no active bleeding  - Status post transfusion 1 unit on 8/2, started on Aranesp, iron, hemoglobin has remained stable History of ALCOHOL ABUSE/ TOBACCO ABUSE/ COCAINE ABUSE  - Urine drug screen was positive for cocaine and marijuana, currently on Ativan withdrawal protocol, stable  Mildly Elevated troponin: Probably secondary to renal failure, further troponins negative, asymptomatic  Chronic CHF: Systolic, current echo on 07/16/2012 showed EF of [id number], grade 2 diastolic dysfunction     Procedures/Studies: Dg Chest 2  View  07/15/2012  *RADIOLOGY REPORT*  Clinical  Data: Epistaxis  CHEST - 2 VIEW  Comparison: 03/10/2011 10/20/2010  Findings: Stable moderate cardiomegaly.  Thoracic aorta contour is normal.  The lungs are clear.  Negative for edema, airspace disease, or pleural effusion.  No acute or suspicious bony abnormality.  Visualized upper abdomen is normal.  IMPRESSION: Stable moderate cardiomegaly with mild pulmonary vascular congestion.  The lungs are clear.  Original Report Authenticated By: Curlene Dolphin, M.D.   Ct Head Wo Contrast  07/15/2012  *RADIOLOGY REPORT*  Clinical Data: 46 year old male with headache and epistaxis.  CT HEAD WITHOUT CONTRAST  Technique:  Contiguous axial images were obtained from the base of the skull through the vertex without contrast.  Comparison: 07/14/2012  Findings: No intracranial abnormalities are identified, including mass lesion or mass effect, hydrocephalus, extra-axial fluid collection, midline shift, hemorrhage, or acute infarction.  The visualized bony calvarium is unremarkable. High attenuation fluid in the nasal cavity and paranasal sinuses noted compatible with blood.  IMPRESSION: No evidence of intracranial quality.  Blood in the nasal cavity and paranasal sinuses.  Original Report Authenticated By: Lura Em, M.D.   US Renal  07/15/2012  *RADIOLOGY REPORT*  Clinical Data: 46 year old male with acute on chronic renal failure.  RENAL/URINARY TRACT ULTRASOUND COMPLETE  Comparison:  10/21/2010 ultrasound  Findings:  Right Kidney:  The right kidney is small and echogenic measuring 6 cm in greatest longitudinal dimension.  There is no evidence of solid mass, hydronephrosis or definite renal calculi.  Left Kidney:  The left kidney is small and echogenic measuring 7.5 cm in greatest longitudinal dimension.  There is no evidence of hydronephrosis, solid renal mass or definite renal calculi.  Bladder:  The bladder is normal for degree of filling.  IMPRESSION: Small echogenic  kidneys compatible with chronic medical renal disease.  No evidence of hydronephrosis.  Original Report Authenticated By: Lura Em, M.D.   Dg Chest Port 1 View  07/17/2012  *RADIOLOGY REPORT*  Clinical Data: Tunneled dialysis catheter placement.  Rule out pneumothorax.  PORTABLE CHEST - 1 VIEW  Comparison: 07/15/2012.  Findings: There is a new right internal jugular PermCath with tips terminating in the right atrium and at the superior cavoatrial junction.  Lung volumes are normal.  No consolidative airspace disease.  No pleural effusions.  No pneumothorax.  Mild pulmonary venous congestion without frank pulmonary edema.  Heart size is mildly enlarged.  Mediastinal contours are unremarkable.  IMPRESSION: 1.  Right internal jugular PermCath in proper position, as above. No associated pneumothorax. 2.  Cardiomegaly with pulmonary venous congestion but no frank pulmonary edema.  Original Report Authenticated By: Etheleen Mayhew, M.D.   Dg Abd 2 Views  07/21/2012  *RADIOLOGY REPORT*  Clinical Data: Abdominal pain.  ABDOMEN - 2 VIEW  Comparison: None.  Findings: There is cardiomegaly.  Double-lumen dialysis catheter in the right atrium.  No free air or free fluid in the abdomen.  The bowel gas pattern is normal.  No worrisome abdominal calcifications.  No osseous abnormality.  IMPRESSION: Benign-appearing abdomen.  Original Report Authenticated By: Larey Seat, M.D.   Dg Fluoro Guide Cv Line-no Report  07/21/2012  CLINICAL DATA: diatek   FLOURO GUIDE CV LINE  Fluoroscopy was utilized by the requesting physician.  No radiographic  interpretation.         Consultations:  Vascular surgery  Nephrology  ENT  Discharge Exam: Filed Vitals:   07/22/12 1020  BP: 137/91  Pulse: 63  Temp:   Resp: 13   Filed Vitals:  07/22/12 0900 07/22/12 0920 07/22/12 0950 07/22/12 1020  BP: 162/97 139/93 134/89 137/91  Pulse: 72 67 67 63  Temp: 97.7 F (36.5 C)     TempSrc: Oral     Resp: 16 15 14  13   Height:      Weight: 62.8 kg (138 lb 7.2 oz)     SpO2: 98%       General: Pt is alert, follows commands appropriately, not in acute distress Cardiovascular: Regular rate and rhythm, S1/S2 +, no murmurs, no rubs, no gallops Respiratory: Clear to auscultation bilaterally, no wheezing, no crackles, no rhonchi Abdominal: Soft, non tender, non distended, bowel sounds +, no guarding Extremities: no edema, no cyanosis   Discharge Instructions  Discharge Orders    Future Appointments: Provider: Department: Dept Phone: Center:   08/20/2012 10:15 AM Conrad Hebron, MD [facility] [phone] [facility]     Medication List  As of 07/22/2012 10:53 AM   STOP taking these medications         hydrALAZINE 50 MG tablet      multivitamin with minerals Tabs      VISINE OP         TAKE these medications         amLODipine 10 MG tablet   Commonly known as: NORVASC   Take 1 tablet (10 mg total) by mouth daily.      calcium acetate 667 MG capsule   Commonly known as: PHOSLO   Take 2 capsules (1,334 mg total) by mouth 3 (three) times daily with meals.      carvedilol 12.5 MG tablet   Commonly known as: COREG   Take 2 tablets (25 mg total) by mouth 2 (two) times daily with a meal.      ciprofloxacin 250 MG tablet   Commonly known as: CIPRO   Take 1 tablet (250 mg total) by mouth daily.      cloNIDine 0.1 MG tablet   Commonly known as: CATAPRES   Take 1 tablet (0.1 mg total) by mouth 2 (two) times daily.      HYDROcodone-acetaminophen 5-325 MG per tablet   Commonly known as: NORCO/VICODIN   Take 1 tablet by mouth every 6 (six) hours as needed.      isosorbide mononitrate 60 MG 24 hr tablet   Commonly known as: IMDUR   Take 1 tablet (60 mg total) by mouth daily.      lisinopril 10 MG tablet   Commonly known as: PRINIVIL,ZESTRIL   Take 1 tablet (10 mg total) by mouth daily.      mupirocin ointment 2 %   Commonly known as: BACTROBAN   Apply topically 3 (three) times daily. Place in  nostrils 3 times daily x 14 days      oxymetazoline 0.05 % nasal spray   Commonly known as: AFRIN   Place 3 sprays into the nose 2 (two) times daily.      thiamine 100 MG tablet   Take 1 tablet (100 mg total) by mouth daily.           Follow-up Information    Follow up with Ruby Cola, MD on 07/22/2012. (at 3:00PM for the removal of nasal packing )    Contact information:   [facility] [address] [place] [phone]       Follow up with Hinda Lenis, MD. Schedule an appointment as soon as possible for a visit in 2 weeks.  Contact information:   [address] [place] [phone]       Schedule an appointment as soon as possible for a visit with Pcp Not In System. ([facility])           The results of significant diagnostics from this hospitalization (including imaging, microbiology, ancillary and laboratory) are listed below for reference.     Microbiology: No results found for this or any previous visit (from the past 240 hour(s)).   Labs: Basic Metabolic Panel:  Lab Q000111Q 0525 07/21/12 0515 07/20/12 0920 07/19/12 0910 07/18/12 0756  NA 136 140 139 138 140  K 4.4 3.8 3.5 3.6 4.3  CL 102 104 103 103 105  CO2 26 26 29 25  18*  GLUCOSE 86 87 135* 85 102*  BUN 26* 17 31* 58* 101*  CREATININE 6.42* 4.88* 5.87* 7.87* 10.86*  CALCIUM 7.3* 7.5* 7.5* 7.5* 6.5*  MG -- -- -- 1.9 --  PHOS 4.3 3.1 3.6 5.0* 6.8*   Liver Function Tests:  Lab 07/22/12 0525 07/21/12 0515 07/20/12 0920 07/19/12 0910 07/18/12 0756 07/16/12 1321  AST -- -- -- -- -- 14  ALT -- -- -- -- -- 13  ALKPHOS -- -- -- -- -- 49  BILITOT -- -- -- -- -- 0.1*  PROT -- -- -- -- -- 5.1*  ALBUMIN 2.2* 2.5* 2.4* 2.3* 2.3* --   No results found for this basename: LIPASE:5,AMYLASE:5 in the last 168 hours No results found for this basename: AMMONIA:5 in the last 168 hours CBC:  Lab  07/22/12 0525 07/21/12 0515 07/19/12 0910 07/17/12 0520 07/16/12 2013  WBC 8.8 10.1 7.4 10.5 12.6*  NEUTROABS 5.7 -- -- -- --  HGB 9.4* 10.6* 7.3* 7.6* 7.4*  HCT 28.6* 32.1* 21.6* 22.0* 21.4*  MCV 86.9 86.5 86.4 84.6 83.6  PLT 173 140* 172 239 241   Cardiac Enzymes:  Lab 07/16/12 1320 07/16/12 0410 07/15/12 2146  CKTOTAL 520* 585* 569*  CKMB 10.7* 11.6* 10.1*  CKMBINDEX -- -- --  TROPONINI <0.30 <0.30 <0.30   BNP: BNP (last 3 results)  Basename 07/15/12 1008  PROBNP 31955.0*   CBG: No results found for this basename: GLUCAP:5 in the last 168 hours   SIGNED: Time coordinating discharge: Over 30 minutes  Sobia Karger, MD  [facility] 07/22/2012, 10:53 AM Pager [phone]  If 7PM-7AM, please contact night-coverage [email or web address] Password [email or web address]

## 2012-07-22 NOTE — Progress Notes (Signed)
Subjective:                                               Patient feels good. No nausea, vomiting, SOB.  Patient feels having urine retention. I asked nurse for bladder scan.  Nose bleeding resolved. Will have the nasal packing removed by Dr. Simeon Craft in office on 07/22/2012 at 3:00PM.   Tolerated 4 HD (8/6, 8/5, 8/4, 8/3).   Will get another HD today in AM  AVF was placed on the left arm on 07/21/2012.  The patient had a R IJ HD catheter placed 07/17/12  Objective: Vital signs in last 24 hours: Filed Vitals:   07/21/12 1748 07/21/12 2055 07/22/12 0317 07/22/12 0436  BP: 154/90 147/82 157/87 163/83  Pulse: 71 64 65 68  Temp: 98.3 F (36.8 C) 98 F (36.7 C) 97.6 F (36.4 C) 98.7 F (37.1 C)  TempSrc: Oral Oral Oral Oral  Resp: 18 18 18 18   Height:  5\' 9"  (1.753 m)    Weight:  136 lb 3.9 oz (61.8 kg)    SpO2: 99% 100% 100% 100%   Weight change: 0 lb (0 kg)  Intake/Output Summary (Last 24 hours) at 07/22/12 0703 Last data filed at 07/21/12 2059  Gross per 24 hour  Intake   1080 ml  Output      0 ml  Net   1080 ml   Urine 1250 (8/2); 2900 (8/3); 1400 (8/4), 600 (8/6), No UOP recorded today Body weight:  07/22/12  07/21/12 2055 136 lb 3.9 oz (61.8 kg) 07/20/12 2106 136 lb 3.9 oz (61.8 kg) 07/19/12 2137 146 lb 2.6 oz (66.3 kg) 07/19/12 1244 145 lb 4.5 oz (65.9 kg) 07/19/12 0840 150 lb 5.7 oz (68.2 kg) 07/18/12 0950 160 lbs 15 oz (73 kg) pre-dialysis 07/17/12 2304 162 lbs 0.6 oz (73.5 kg) post-dialysis 07/17/12 0416 147 lb 11.3 oz    (67.0  kg) 07/15/12 0942 160 lb                 (72.6 kg)  General: lying in bed, resting, NAD, Right sided tunneled dialysis catheter in place. HEENT: pupils equal round and reactive to light, vision grossly intact, oropharynx clear and non-erythematous, minimal crusted blood noted below nares, nasal packing in place Neck: supple, no lymphadenopathy Lungs: clear to ascultation bilaterally, normal work of respiration, no wheezes, rales,  ronchi Heart: regular rate and rhythm, no murmurs, gallops, or rubs Abdomen: soft, non-tender, non-distended, normal bowel sounds Extremities: Trace amount of leg edema bilaterally. Neurologic: alert & oriented X3, cranial nerves II-XII intact, strength grossly intact, sensation intact to light touch  Lab Results: Basic Metabolic Panel:  Lab [id number] 0525 07/21/12 0515 07/19/12 0910  NA 136 140 --  K 4.4 3.8 --  CL 102 104 --  CO2 26 26 --  GLUCOSE 86 87 --  BUN 26* 17 --  CREATININE 6.42* 4.88* --  CALCIUM 7.3* 7.5* --  MG -- -- 1.9  PHOS 4.3 3.1 --     Ref. Range 06/24/2009 05:42 10/21/2010 14:20 07/15/2012 18:06  PTH Latest Range: 14.0-72.0 pg/mL 213.7 (H) 317.3 (H) 684.9 (H)   Liver Function Tests:  Lab 07/22/12 0525 07/21/12 0515 07/16/12 1321 07/15/12 1007  AST -- -- 14 20  ALT -- -- 13 17  ALKPHOS -- -- 49 53  BILITOT -- -- 0.1* 0.1*  PROT -- --  5.1* 5.2*  ALBUMIN 2.2* 2.5* -- --   CBC:  Lab 07/22/12 0525 07/21/12 0515 07/15/12 1007  WBC 8.8 10.1 --  NEUTROABS 5.7 -- 4.1  HGB 9.4* 10.6* --  HCT 28.6* 32.1* --  MCV 86.9 86.5 --  PLT 173 140* --   Cardiac Enzymes:  Lab 07/16/12 1320 07/16/12 0410 07/15/12 2146  CKTOTAL 520* 585* 569*  CKMB 10.7* 11.6* 10.1*  CKMBINDEX -- -- --  TROPONINI <0.30 <0.30 <0.30   BNP:  Lab 07/15/12 1008  PROBNP 31955.0*     Lab 07/17/12 0520 07/15/12 1806  LABPROT 14.4 16.9*  INR 1.10 1.35   Anemia Panel:  Lab 07/15/12 2144 07/15/12 1806  VITAMINB12 489 --  FOLATE 7.1 --  FERRITIN -- 27  TIBC -- 337  IRON -- 57  RETICCTPCT 2.6 --   Urine Drug Screen: Drugs of Abuse     Component Value Date/Time   LABOPIA NONE DETECTED 07/15/2012 2328   COCAINSCRNUR POSITIVE* 07/15/2012 2328   LABBENZ NONE DETECTED 07/15/2012 2328   AMPHETMU NONE DETECTED 07/15/2012 2328   THCU POSITIVE* 07/15/2012 2328   LABBARB NONE DETECTED 07/15/2012 2328    Urinalysis:  Lab 07/21/12 1432 07/15/12 2328  COLORURINE YELLOW YELLOW  LABSPEC 1.012  1.015  PHURINE 7.0 5.0  GLUCOSEU NEGATIVE NEGATIVE  HGBUR NEGATIVE NEGATIVE  BILIRUBINUR NEGATIVE NEGATIVE  KETONESUR NEGATIVE NEGATIVE  PROTEINUR 100* NEGATIVE  UROBILINOGEN 0.2 0.2  NITRITE NEGATIVE NEGATIVE  LEUKOCYTESUR SMALL* NEGATIVE    Studies/Results: Dg Chest 2 View  07/15/2012  *RADIOLOGY REPORT*  Clinical Data: Epistaxis  CHEST - 2 VIEW  Comparison: 03/10/2011 10/20/2010  Findings: Stable moderate cardiomegaly.  Thoracic aorta contour is normal.  The lungs are clear.  Negative for edema, airspace disease, or pleural effusion.  No acute or suspicious bony abnormality.  Visualized upper abdomen is normal.  IMPRESSION: Stable moderate cardiomegaly with mild pulmonary vascular congestion.  The lungs are clear.  Original Report Authenticated By: Curlene Dolphin, M.D.   Ct Head Wo Contrast  07/15/2012  *RADIOLOGY REPORT*  Clinical Data: 46 year old male with headache and epistaxis.  CT HEAD WITHOUT CONTRAST  Technique:  Contiguous axial images were obtained from the base of the skull through the vertex without contrast.  Comparison: 07/14/2012  Findings: No intracranial abnormalities are identified, including mass lesion or mass effect, hydrocephalus, extra-axial fluid collection, midline shift, hemorrhage, or acute infarction.  The visualized bony calvarium is unremarkable. High attenuation fluid in the nasal cavity and paranasal sinuses noted compatible with blood.  IMPRESSION: No evidence of intracranial quality.  Blood in the nasal cavity and paranasal sinuses.  Original Report Authenticated By: Lura Em, M.D.   US Renal  07/15/2012  *RADIOLOGY REPORT*  Clinical Data: 46 year old male with acute on chronic renal failure.  RENAL/URINARY TRACT ULTRASOUND COMPLETE  Comparison:  10/21/2010 ultrasound  Findings:  Right Kidney:  The right kidney is small and echogenic measuring 6 cm in greatest longitudinal dimension.  There is no evidence of solid mass, hydronephrosis or definite renal  calculi.  Left Kidney:  The left kidney is small and echogenic measuring 7.5 cm in greatest longitudinal dimension.  There is no evidence of hydronephrosis, solid renal mass or definite renal calculi.  Bladder:  The bladder is normal for degree of filling.  IMPRESSION: Small echogenic kidneys compatible with chronic medical renal disease.  No evidence of hydronephrosis.  Original Report Authenticated By: Lura Em, M.D.   Medications: Scheduled Meds:    . amLODipine  10 mg  Oral Daily  . calcium acetate  1,334 mg Oral TID WC  . carvedilol  37.5 mg Oral BID WC  . cephALEXin  500 mg Oral Q12H  . cloNIDine  0.1 mg Oral BID  . darbepoetin (ARANESP) injection - DIALYSIS  100 mcg Intravenous Q Sat-HD  . ferric gluconate (FERRLECIT/NULECIT) IV  125 mg Intravenous Q T,Th,Sa-HD  . ferric gluconate (FERRLECIT/NULECIT) IV  125 mg Intravenous Q Thu-HD  . folic acid  1 mg Oral Daily  . isosorbide mononitrate  60 mg Oral Daily  . multivitamin with minerals  1 tablet Oral Daily  . pantoprazole  40 mg Oral Q1200  . paricalcitol  2 mcg Intravenous Q T,Th,Sa-HD  . sodium chloride  3 mL Intravenous Q12H  . thiamine  100 mg Oral Daily   Or  . thiamine  100 mg Intravenous Daily  . DISCONTD: carvedilol  25 mg Oral BID WC   Continuous Infusions:  PRN Meds:.acetaminophen, acetaminophen, albuterol, cloNIDine, hydrALAZINE, HYDROcodone-acetaminophen, HYDROmorphone (DILAUDID) injection, morphine injection, ondansetron (ZOFRAN) IV, ondansetron, oxyCODONE-acetaminophen, senna-docusate, sorbitol, DISCONTD: 0.9 % irrigation (POUR BTL), DISCONTD: heparin 6000 unit irrigation  Assessment/Plan:  1. ESRD: Most likely secondary to HTN (poor medication compliance and cocaine use). He had hypervolemic status on admission as evidenced by CXR and leg edema and SOB. Renal US showed no hydronephrosis and bilaterally small shrunken kidneys. Protein/cre ratio is slightly elevated at 0.16 (normal <0.15). HBsAg and HBsAb are  negative.   -s/p R IJ HD catheter 8/3, AV fistula was placed on 07/21/12. -s/p HD 8/3, 8/4, 8/5 and 8/6. -CLIP process has been started - has no Medicaid or insurance - will need emergency Medicaid application -Will do HD today (8/8)  2. Epistaxis: Resolved. Probably associated with hypertension but also in part from dysfunctional platelets of advanced renal dysfunction/uremia and use of crack cocaine.  -s/p nasal packing and bilateral sphenopalatine artery cauterization by ENT 8/1. -per ENT, nasal packs should stay in place for 7 days (will  remove on  8/8) -on keflex while nasal packs in place -DDAVP was given (8/1).   3. Hypertensive urgency: bp was 171/21 on admission. Currently on Imdur, amlodipine, carvedilol, and prn IV hydralazine. bp is still elevated at 163/83 today  - Increased dose of Carvedilol to 37.5 mg bid on 07/21/12 - Per primary team.   4. Anemia of chronic kidney disease complicated by acute blood loss anemia from epistaxis: both iron and ferritin are at low normal range. It is likely due to iron deficiency. Vitamin B12 normal.  -S/p transfusion of 4 units of blood so far -S/p aranesp 100 (8/3) and ferric gluconate 125 mg (8/3)   Lab 07/22/12 0525 07/21/12 0515 07/19/12 0910 07/17/12 0520 07/16/12 2013  HGB 9.4* 10.6* 7.3* 7.6* 7.4*   5. 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  Note plans for DC today.  Pt seen on HD.  His AVF is clotted and [facility] wants to do central venogram.  Spoke with Dr Bridgett Larsson about getting it done sooner than later.  Pt will need to go th [place] tomorrow to sign paperwork. Maika Kaczmarek,Jemuel T,MD 07/22/2012 11:34 AM

## 2012-07-22 NOTE — Telephone Encounter (Signed)
Message copied by Berniece Salines on Thu Jul 22, 2012  9:54 AM ------      Message from: Denman George      Created: Wed Jul 21, 2012  5:52 PM                   ----- Message -----         From: Conrad Cotulla, MD         Sent: 07/21/2012  10:43 AM           To: Patrici Ranks, Alfonso Patten, RN            Kenderick Ketelsen      WU:[id number]      September 11, 1966            PROCEDURE:      left first stage brachial vein transposition (brachiobrachial arteriovenous fistula) placement            Asst: Gerri Lins, [facility]             Follow-up: 4 weeks

## 2012-07-22 NOTE — Progress Notes (Signed)
Pt discharged with instructions and prescriptions. Pt given education on signs and symptoms of when to call the doctor and he was told in the case of an emergency to go to the hospital.

## 2012-07-22 NOTE — Progress Notes (Signed)
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  He will probably need a L arm and central venogram prior to proceeding with any further access in that arm  Depending on his findings, he might be a candidate for a L upper arm AVG  He can follow up in the office in 2 weeks  Subjective  - 1 Day Post-Op  No complaints  Objective Filed Vitals:   07/21/12 1748 07/21/12 2055 07/22/12 0317 07/22/12 0436  BP: 154/90 147/82 157/87 163/83  Pulse: 71 64 65 68  Temp: 98.3 F (36.8 C) 98 F (36.7 C) 97.6 F (36.4 C) 98.7 F (37.1 C)  TempSrc: Oral Oral Oral Oral  Resp: 18 18 18 18   Height:  5\' 9"  (1.753 m)    Weight:  136 lb 3.9 oz (61.8 kg)    SpO2: 99% 100% 100% 100%    Intake/Output Summary (Last 24 hours) at 07/22/12 0818 Last data filed at 07/21/12 2059  Gross per 24 hour  Intake   1080 ml  Output      0 ml  Net   1080 ml    PULM  CTAB CV  RRR GI  soft, NTND VASC  Palpable L radial pulse, inc c/d/i, no thrill or bruit  Laboratory CBC    Component Value Date/Time   WBC 8.8 07/22/2012 0525   HGB 9.4* 07/22/2012 0525   HCT 28.6* 07/22/2012 0525   PLT 173 07/22/2012 0525    BMET    Component Value Date/Time   NA 136 07/22/2012 0525   K 4.4 07/22/2012 0525   CL 102 07/22/2012 0525   CO2 26 07/22/2012 0525   GLUCOSE 86 07/22/2012 0525   BUN 26* 07/22/2012 0525   CREATININE 6.42* 07/22/2012 0525   CALCIUM 7.3* 07/22/2012 0525   CALCIUM 8.2* 10/21/2010 1420   GFRNONAA 9* 07/22/2012 0525   GFRAA 11* 07/22/2012 0525    Adele Barthel, MD [facility] [place] Office: [phone] Pager: [phone]  07/22/2012, 8:18 AM

## 2012-07-22 NOTE — Progress Notes (Signed)
Subjective: *POD#7 from bilateral sphenopalatine artery ligation and control of nasal hemorrhage. No epistaxis, awake, alert.  Objective: Vital signs in last 24 hours: Temp:  [97.6 F (36.4 C)-98.7 F (37.1 C)] 98.7 F (37.1 C) (08/08 0436) Pulse Rate:  [64-71] 68  (08/08 0436) Resp:  [10-21] 18  (08/08 0436) BP: (139-163)/(82-113) 163/83 mmHg (08/08 0436) SpO2:  [98 %-100 %] 100 % (08/08 0436) Weight:  [61.8 kg (136 lb 3.9 oz)] 61.8 kg (136 lb 3.9 oz) (08/07 2055)  Nose with BL 9cm meroceles secure to face, no epistaxis, EOMI, PERRLA, Cn 2-12 intact and symmetric. I removed the BL merocele packs, minimal mucous cleaned with gauze, no epistaxis. Patient tolerated packing removal well.  @LABLAST2 (wbc:2,hgb:2,hct:2,plt:2)  Basename 07/22/12 0525 07/21/12 0515  NA 136 140  K 4.4 3.8  CL 102 104  CO2 26 26  GLUCOSE 86 87  BUN 26* 17  CREATININE 6.42* 4.88*  CALCIUM 7.3* 7.5*    Medications:  Scheduled Meds:   . amLODipine  10 mg Oral Daily  . calcium acetate  1,334 mg Oral TID WC  . carvedilol  37.5 mg Oral BID WC  . cephALEXin  500 mg Oral Q12H  . cloNIDine  0.1 mg Oral BID  . darbepoetin (ARANESP) injection - DIALYSIS  100 mcg Intravenous Q Sat-HD  . ferric gluconate (FERRLECIT/NULECIT) IV  125 mg Intravenous Q T,Th,Sa-HD  . ferric gluconate (FERRLECIT/NULECIT) IV  125 mg Intravenous Q Thu-HD  . folic acid  1 mg Oral Daily  . isosorbide mononitrate  60 mg Oral Daily  . multivitamin with minerals  1 tablet Oral Daily  . mupirocin ointment   Nasal TID  . oxymetazoline  3 spray Each Nare BID  . pantoprazole  40 mg Oral Q1200  . paricalcitol  2 mcg Intravenous Q T,Th,Sa-HD  . sodium chloride  3 mL Intravenous Q12H  . thiamine  100 mg Oral Daily   Or  . thiamine  100 mg Intravenous Daily  . DISCONTD: carvedilol  25 mg Oral BID WC   Continuous Infusions:  PRN Meds:.acetaminophen, acetaminophen, albuterol, cloNIDine, hydrALAZINE, HYDROcodone-acetaminophen, HYDROmorphone  (DILAUDID) injection, morphine injection, ondansetron (ZOFRAN) IV, ondansetron, oxyCODONE-acetaminophen, senna-docusate, sorbitol, DISCONTD: 0.9 % irrigation (POUR BTL), DISCONTD: heparin 6000 unit irrigation  Assessment/Plan: Doing well POD#7 from endoscopic sphenopalatine artery ligation and control of epistaxis. Packing removed. Nasal mupirocin x 14 days and Afrin BID x 3 days then stop. Follow up with Dr. Simeon Craft as needed, avoid nose blowing for 2 weeks. Can use OTC nasal saline spray if has excessive mucous or crusting.   LOS: 7 days   Ruby Cola 07/22/2012, 8:17 AM

## 2012-07-22 NOTE — Clinical Social Work Note (Signed)
CSW assisted patient with [facility] application for dialysis transportation. Application faxed to [facility] office and call made to [facility] and message left to confirm receipt of application. Patient given completed application to take with him to his in-office [facility] appointment.  Jery Hollern Givens, MSW, LCSW [phone]

## 2012-07-30 ENCOUNTER — Ambulatory Visit: Payer: 59 | Admitting: Vascular Surgery

## 2012-08-05 ENCOUNTER — Other Ambulatory Visit: Payer: Self-pay | Admitting: *Deleted

## 2012-08-06 ENCOUNTER — Ambulatory Visit: Payer: 59 | Admitting: Vascular Surgery

## 2012-08-08 MED ORDER — SODIUM CHLORIDE 0.9 % IJ SOLN: 3.0000 mL | INTRAMUSCULAR | Status: DC | PRN

## 2012-08-09 ENCOUNTER — Ambulatory Visit (HOSPITAL_COMMUNITY)
Admission: RE | Admit: 2012-08-09 | Discharge: 2012-08-09 | Disposition: A | Discharge status: Home / Self Care | Payer: Medicaid Other | Source: Ambulatory Visit | Attending: Vascular Surgery | Admitting: Vascular Surgery

## 2012-08-09 ENCOUNTER — Encounter (HOSPITAL_COMMUNITY)
Admission: RE | Disposition: A | Discharge status: Home / Self Care | Payer: Self-pay | Source: Ambulatory Visit | Attending: Vascular Surgery

## 2012-08-09 DIAGNOSIS — N186 End stage renal disease: Secondary | ICD-10-CM

## 2012-08-09 DIAGNOSIS — N19 Unspecified kidney failure: Secondary | ICD-10-CM

## 2012-08-09 HISTORY — PX: VENOGRAM: SHX5497

## 2012-08-09 LAB — POCT I-STAT, CHEM 8
Creatinine, Ser: 7 mg/dL — ABNORMAL HIGH (ref 0.50–1.35)
Glucose, Bld: 96 mg/dL (ref 70–99)
HCT: 41 % (ref 39.0–52.0)
Hemoglobin: 13.9 g/dL (ref 13.0–17.0)
Potassium: 4.2 mEq/L (ref 3.5–5.1)
Sodium: 138 mEq/L (ref 135–145)
TCO2: 24 mmol/L (ref 0–100)

## 2012-08-09 SURGERY — VENOGRAM
Anesthesia: LOCAL

## 2012-08-09 NOTE — Interval H&P Note (Signed)
[facility] [place]  History and Physical Update  The patient was interviewed and re-examined.  The patient's previous History and Physical has been reviewed and is unchanged except for: failed L brachial vein transposition.  Plan is left arm and central venogram.  Adele Barthel, MD [facility] Office: [phone] Pager: [phone]  08/09/2012, 12:03 PM

## 2012-08-09 NOTE — H&P (View-Only) (Signed)
[facility] [place]  History and Physical Update  The patient was interviewed and re-examined.  The patient's previous History and Physical has been reviewed and is unchanged from my consult on 07/16/12.  Based on his repeat vein mapping, his L brachial vein is his best option for a fistula, brachial vein transposition.  Adele Barthel, MD [facility] [place] Office: [phone] Pager: [phone]  07/21/2012, 8:42 AM

## 2012-08-09 NOTE — Op Note (Signed)
OPERATIVE NOTE   PROCEDURE: 1. left arm and central venogram   PRE-OPERATIVE DIAGNOSIS: end stage renal disease  POST-OPERATIVE DIAGNOSIS: same as above   SURGEON: Adele Barthel, MD  ANESTHESIA: local  ESTIMATED BLOOD LOSS: 5 cc  FINDING(S): 1. Small left basilic and brachial vein 2. Patent left high brachial vein > basilic vein 3. Patent left axillary, subclavian, and innominate veins 4. Patent superior vena cava   SPECIMEN(S):  None  CONTRAST: 45 cc  INDICATIONS: [name] [name] is a 46 y.o. male who  presents with end stage renal disease.  The patient is scheduled for left arm and central venogram to help determine the availability of proximal veins for permanent access placement.  The patient is aware the risks include but are not limited to: bleeding, infection, thrombosis of the cannulated access, and possible anaphylactic reaction to the contrast.  The patient is aware of the risks of the procedure and elects to proceed forward.  DESCRIPTION: After full informed written consent was obtained, the patient was brought back to the angiography suite and placed supine upon the angiography table.  The patient was connected to monitoring equipment.  The left forearm IV was connected to IV extension tubing.  Hand injections were completed to image the arm veins and central venous structures, the findings of which are listed above.  Based on the venogram, this patient's best options is a Left upper arm arteriovenous graft.  COMPLICATIONS: none  CONDITION: stable  Adele Barthel, MD [facility] [place] Office: [phone] Pager: [phone]  08/09/2012 1:37 PM

## 2012-08-19 ENCOUNTER — Encounter: Payer: Self-pay | Admitting: Vascular Surgery

## 2012-08-20 ENCOUNTER — Ambulatory Visit: Payer: 59 | Admitting: Vascular Surgery

## 2012-08-26 ENCOUNTER — Encounter: Payer: Self-pay | Admitting: Vascular Surgery

## 2012-08-27 ENCOUNTER — Ambulatory Visit: Payer: 59 | Admitting: Vascular Surgery

## 2012-09-03 ENCOUNTER — Other Ambulatory Visit: Payer: Self-pay

## 2012-09-03 ENCOUNTER — Ambulatory Visit: Payer: 59 | Admitting: Vascular Surgery

## 2012-09-03 ENCOUNTER — Encounter: Payer: Self-pay | Admitting: Vascular Surgery

## 2012-09-03 ENCOUNTER — Ambulatory Visit (INDEPENDENT_AMBULATORY_CARE_PROVIDER_SITE_OTHER): Payer: 59 | Admitting: Vascular Surgery

## 2012-09-03 VITALS — BP 129/90 | HR 69 | Resp 16 | Ht 69.5 in | Wt 145.1 lb

## 2012-09-03 DIAGNOSIS — N186 End stage renal disease: Secondary | ICD-10-CM

## 2012-09-03 NOTE — Progress Notes (Signed)
[facility] [place]  Postoperative Access Visit  History of Present Illness  [name] [name] is a 46 y.o. year old male who presents for postoperative follow-up for: failed L BRVT (Date: 07/21/12) and L arm and central venogram (08/09/12).  The patient's wounds are healed.  The patient notes no steal symptoms.  The patient is able to complete their activities of daily living.  The patient's current symptoms are: none.  Physical Examination  Filed Vitals:   09/03/12 0917  BP: 129/90  Pulse: 69  Resp: 16   LUE: Incision is healed, skin feels warm, hand grip is 5/5, sensation in digits is intact, no palpable thrill, bruit can not be auscultated   Medical Decision Making  [name] [name] is a 46 y.o. year old male who presents s/p failed L BRVT.  Based on his venogram, I recommended we consider a L 1st stage BVT vs AVG placement if the vein is inadequate.   Thank you for allowing [place] to participate in this patient's care.  Adele Barthel, MD [facility] [place] Office: [phone] Pager: [phone]

## 2012-09-06 ENCOUNTER — Encounter (HOSPITAL_COMMUNITY): Payer: Self-pay | Admitting: Pharmacy Technician

## 2012-09-10 ENCOUNTER — Encounter (HOSPITAL_COMMUNITY): Payer: Self-pay | Admitting: *Deleted

## 2012-09-12 MED ORDER — MUPIROCIN 2 % EX OINT
TOPICAL_OINTMENT | Freq: Two times a day (BID) | CUTANEOUS | Status: DC
  Administered 2012-09-13: 1 via NASAL
  Filled 2012-09-12: qty 22

## 2012-09-12 MED ORDER — SODIUM CHLORIDE 0.9 % IV SOLN: INTRAVENOUS | Status: DC

## 2012-09-12 MED ORDER — CEFAZOLIN SODIUM-DEXTROSE 2-3 GM-% IV SOLR
2.0000 g | Freq: Once | INTRAVENOUS | Status: AC
  Administered 2012-09-13: 2 g via INTRAVENOUS
  Filled 2012-09-12: qty 50

## 2012-09-13 ENCOUNTER — Ambulatory Visit (HOSPITAL_COMMUNITY)
Admission: RE | Admit: 2012-09-13 | Discharge: 2012-09-13 | Disposition: A | Discharge status: Home / Self Care | Payer: Medicaid Other | Source: Ambulatory Visit | Attending: Vascular Surgery | Admitting: Vascular Surgery

## 2012-09-13 ENCOUNTER — Encounter (HOSPITAL_COMMUNITY): Payer: Self-pay | Admitting: *Deleted

## 2012-09-13 ENCOUNTER — Encounter (HOSPITAL_COMMUNITY): Payer: Self-pay | Admitting: Certified Registered"

## 2012-09-13 ENCOUNTER — Ambulatory Visit (HOSPITAL_COMMUNITY): Payer: Medicaid Other | Admitting: Certified Registered"

## 2012-09-13 ENCOUNTER — Telehealth: Payer: Self-pay | Admitting: Vascular Surgery

## 2012-09-13 ENCOUNTER — Encounter (HOSPITAL_COMMUNITY)
Admission: RE | Disposition: A | Discharge status: Home / Self Care | Payer: Self-pay | Source: Ambulatory Visit | Attending: Vascular Surgery

## 2012-09-13 DIAGNOSIS — Z9119 Patient's noncompliance with other medical treatment and regimen: Secondary | ICD-10-CM | POA: Insufficient documentation

## 2012-09-13 DIAGNOSIS — I120 Hypertensive chronic kidney disease with stage 5 chronic kidney disease or end stage renal disease: Secondary | ICD-10-CM | POA: Insufficient documentation

## 2012-09-13 DIAGNOSIS — Z91199 Patient's noncompliance with other medical treatment and regimen due to unspecified reason: Secondary | ICD-10-CM | POA: Insufficient documentation

## 2012-09-13 DIAGNOSIS — N186 End stage renal disease: Secondary | ICD-10-CM

## 2012-09-13 DIAGNOSIS — N19 Unspecified kidney failure: Secondary | ICD-10-CM

## 2012-09-13 LAB — POCT I-STAT, CHEM 8
Glucose, Bld: 82 mg/dL (ref 70–99)
HCT: 39 % (ref 39.0–52.0)
Hemoglobin: 13.3 g/dL (ref 13.0–17.0)
Potassium: 4.8 mEq/L (ref 3.5–5.1)
Sodium: 142 mEq/L (ref 135–145)

## 2012-09-13 LAB — SURGICAL PCR SCREEN
MRSA, PCR: POSITIVE — AB
Staphylococcus aureus: POSITIVE — AB

## 2012-09-13 SURGERY — TRANSPOSITION, VEIN, BASILIC
Anesthesia: General | Site: Arm Upper | Laterality: Left | Wound class: Clean

## 2012-09-13 MED ORDER — 0.9 % SODIUM CHLORIDE (POUR BTL) OPTIME
TOPICAL | Status: DC | PRN
  Administered 2012-09-13: 1000 mL

## 2012-09-13 MED ORDER — HYDROMORPHONE HCL PF 1 MG/ML IJ SOLN
INTRAMUSCULAR | Status: AC
  Filled 2012-09-13: qty 1

## 2012-09-13 MED ORDER — THROMBIN 20000 UNITS EX SOLR
CUTANEOUS | Status: DC | PRN
  Administered 2012-09-13: 09:00:00 via TOPICAL

## 2012-09-13 MED ORDER — OXYCODONE HCL 5 MG PO TABS
ORAL_TABLET | ORAL | Status: AC
  Filled 2012-09-13: qty 1

## 2012-09-13 MED ORDER — MIDAZOLAM HCL 5 MG/5ML IJ SOLN
INTRAMUSCULAR | Status: DC | PRN
  Administered 2012-09-13: 2 mg via INTRAVENOUS

## 2012-09-13 MED ORDER — OXYCODONE HCL 5 MG/5ML PO SOLN: 5.0000 mg | Freq: Once | ORAL | Status: AC | PRN

## 2012-09-13 MED ORDER — OXYCODONE HCL 5 MG PO TABS: 5.0000 mg | ORAL_TABLET | ORAL | Status: DC | PRN

## 2012-09-13 MED ORDER — OXYCODONE HCL 5 MG PO TABS
10.0000 mg | ORAL_TABLET | Freq: Once | ORAL | Status: AC
  Administered 2012-09-13: 10 mg via ORAL

## 2012-09-13 MED ORDER — BUPIVACAINE HCL (PF) 0.5 % IJ SOLN
INTRAMUSCULAR | Status: AC
  Filled 2012-09-13: qty 30

## 2012-09-13 MED ORDER — ONDANSETRON HCL 4 MG/2ML IJ SOLN: 4.0000 mg | Freq: Once | INTRAMUSCULAR | Status: DC | PRN

## 2012-09-13 MED ORDER — THROMBIN 20000 UNITS EX SOLR
CUTANEOUS | Status: AC
  Filled 2012-09-13: qty 20000

## 2012-09-13 MED ORDER — OXYCODONE HCL 5 MG PO TABS
ORAL_TABLET | ORAL | Status: AC
  Filled 2012-09-13: qty 2

## 2012-09-13 MED ORDER — FENTANYL CITRATE 0.05 MG/ML IJ SOLN
INTRAMUSCULAR | Status: DC | PRN
  Administered 2012-09-13 (×3): 50 ug via INTRAVENOUS
  Administered 2012-09-13: 100 ug via INTRAVENOUS

## 2012-09-13 MED ORDER — OXYCODONE HCL 5 MG PO TABS
5.0000 mg | ORAL_TABLET | Freq: Once | ORAL | Status: AC | PRN
  Administered 2012-09-13: 5 mg via ORAL

## 2012-09-13 MED ORDER — HYDROMORPHONE HCL PF 1 MG/ML IJ SOLN
0.2500 mg | INTRAMUSCULAR | Status: DC | PRN
  Administered 2012-09-13 (×2): 0.25 mg via INTRAVENOUS

## 2012-09-13 MED ORDER — LIDOCAINE HCL (CARDIAC) 20 MG/ML IV SOLN
INTRAVENOUS | Status: DC | PRN
  Administered 2012-09-13: 100 mg via INTRAVENOUS

## 2012-09-13 MED ORDER — SODIUM CHLORIDE 0.9 % IR SOLN
Status: DC | PRN
  Administered 2012-09-13: 08:00:00

## 2012-09-13 MED ORDER — MEPERIDINE HCL 25 MG/ML IJ SOLN: 6.2500 mg | INTRAMUSCULAR | Status: DC | PRN

## 2012-09-13 MED ORDER — SODIUM CHLORIDE 0.9 % IV SOLN
INTRAVENOUS | Status: DC | PRN
  Administered 2012-09-13: 07:00:00 via INTRAVENOUS

## 2012-09-13 MED ORDER — LIDOCAINE-EPINEPHRINE (PF) 1 %-1:200000 IJ SOLN
INTRAMUSCULAR | Status: AC
  Filled 2012-09-13: qty 10

## 2012-09-13 MED ORDER — PROPOFOL 10 MG/ML IV BOLUS
INTRAVENOUS | Status: DC | PRN
  Administered 2012-09-13: 200 mg via INTRAVENOUS

## 2012-09-13 MED ORDER — ONDANSETRON HCL 4 MG/2ML IJ SOLN
INTRAMUSCULAR | Status: DC | PRN
  Administered 2012-09-13: 4 mg via INTRAVENOUS

## 2012-09-13 MED ORDER — THROMBIN 20000 UNITS EX SOLR: CUTANEOUS | Status: DC | PRN

## 2012-09-13 SURGICAL SUPPLY — 40 items
CANISTER SUCTION 2500CC (MISCELLANEOUS) ×2 IMPLANT
CLIP TI MEDIUM 24 (CLIP) ×2 IMPLANT
CLIP TI WIDE RED SMALL 24 (CLIP) ×2 IMPLANT
CLOTH BEACON ORANGE TIMEOUT ST (SAFETY) ×2 IMPLANT
COVER PROBE W GEL 5X96 (DRAPES) ×2 IMPLANT
COVER SURGICAL LIGHT HANDLE (MISCELLANEOUS) ×2 IMPLANT
DECANTER SPIKE VIAL GLASS SM (MISCELLANEOUS) IMPLANT
DERMABOND ADVANCED (GAUZE/BANDAGES/DRESSINGS) ×1
DERMABOND ADVANCED .7 DNX12 (GAUZE/BANDAGES/DRESSINGS) ×1 IMPLANT
DRAIN PENROSE 1/2X12 LTX STRL (WOUND CARE) IMPLANT
ELECT REM PT RETURN 9FT ADLT (ELECTROSURGICAL) ×2
ELECTRODE REM PT RTRN 9FT ADLT (ELECTROSURGICAL) ×1 IMPLANT
GLOVE BIO SURGEON STRL SZ7 (GLOVE) ×2 IMPLANT
GLOVE BIOGEL PI IND STRL 6.5 (GLOVE) ×1 IMPLANT
GLOVE BIOGEL PI IND STRL 7.0 (GLOVE) ×2 IMPLANT
GLOVE BIOGEL PI IND STRL 7.5 (GLOVE) ×3 IMPLANT
GLOVE BIOGEL PI INDICATOR 6.5 (GLOVE) ×1
GLOVE BIOGEL PI INDICATOR 7.0 (GLOVE) ×2
GLOVE BIOGEL PI INDICATOR 7.5 (GLOVE) ×3
GLOVE SS BIOGEL STRL SZ 7 (GLOVE) ×1 IMPLANT
GLOVE SUPERSENSE BIOGEL SZ 7 (GLOVE) ×1
GLOVE SURG SS PI 7.5 STRL IVOR (GLOVE) ×4 IMPLANT
GOWN PREVENTION PLUS XLARGE (GOWN DISPOSABLE) ×6 IMPLANT
GOWN STRL NON-REIN LRG LVL3 (GOWN DISPOSABLE) ×6 IMPLANT
KIT BASIN OR (CUSTOM PROCEDURE TRAY) ×2 IMPLANT
KIT ROOM TURNOVER OR (KITS) ×2 IMPLANT
NS IRRIG 1000ML POUR BTL (IV SOLUTION) ×2 IMPLANT
PACK CV ACCESS (CUSTOM PROCEDURE TRAY) ×2 IMPLANT
PAD ARMBOARD 7.5X6 YLW CONV (MISCELLANEOUS) ×4 IMPLANT
SPONGE SURGIFOAM ABS GEL 100 (HEMOSTASIS) ×2 IMPLANT
SUT MNCRL AB 4-0 PS2 18 (SUTURE) ×2 IMPLANT
SUT PROLENE 6 0 BV (SUTURE) ×2 IMPLANT
SUT PROLENE 7 0 BV 1 (SUTURE) ×2 IMPLANT
SUT SILK 2 0 SH (SUTURE) IMPLANT
SUT VIC AB 3-0 SH 27 (SUTURE) ×1
SUT VIC AB 3-0 SH 27X BRD (SUTURE) ×1 IMPLANT
TOWEL OR 17X24 6PK STRL BLUE (TOWEL DISPOSABLE) ×2 IMPLANT
TOWEL OR 17X26 10 PK STRL BLUE (TOWEL DISPOSABLE) ×2 IMPLANT
UNDERPAD 30X30 INCONTINENT (UNDERPADS AND DIAPERS) ×2 IMPLANT
WATER STERILE IRR 1000ML POUR (IV SOLUTION) ×2 IMPLANT

## 2012-09-13 NOTE — Transfer of Care (Signed)
Immediate Anesthesia Transfer of Care Note  Patient: [name] [name]  Procedure(s) Performed: Procedure(s) (LRB) with comments: [place] (Left) - First Stage Left Arm Basilic Vein Transposition   Patient Location: PACU  Anesthesia Type: General  Level of Consciousness: awake, alert , oriented and patient cooperative  Airway & Oxygen Therapy: Patient Spontanous Breathing  Post-op Assessment: Report given to PACU RN and Post -op Vital signs reviewed and stable  Post vital signs: Reviewed and stable  Complications: No apparent anesthesia complications

## 2012-09-13 NOTE — Anesthesia Preprocedure Evaluation (Addendum)
Anesthesia Evaluation  Patient identified by MRN, date of birth, ID band Patient awake    Reviewed: Allergy & Precautions, H&P , NPO status , Patient's Chart, lab work & pertinent test results, reviewed documented beta blocker date and time   History of Anesthesia Complications Negative for: history of anesthetic complications  Airway Mallampati: I TM Distance: >3 FB Neck ROM: Full    Dental  (+) Teeth Intact   Pulmonary former smoker (quit one month ago),          Cardiovascular hypertension, Pt. on medications and Pt. on home beta blockers     Neuro/Psych PSYCHIATRIC DISORDERS    GI/Hepatic   Endo/Other    Renal/GU ESRFRenal disease     Musculoskeletal   Abdominal   Peds  Hematology   Anesthesia Other Findings   Reproductive/Obstetrics                          Anesthesia Physical Anesthesia Plan  ASA: III  Anesthesia Plan: General   Post-op Pain Management:    Induction: Intravenous  Airway Management Planned: LMA  Additional Equipment:   Intra-op Plan:   Post-operative Plan: Extubation in OR  Informed Consent: I have reviewed the patients History and Physical, chart, labs and discussed the procedure including the risks, benefits and alternatives for the proposed anesthesia with the patient or authorized representative who has indicated his/her understanding and acceptance.   Dental advisory given  Plan Discussed with: Surgeon and CRNA  Anesthesia Plan Comments:        Anesthesia Quick Evaluation

## 2012-09-13 NOTE — Anesthesia Procedure Notes (Signed)
Procedure Name: LMA Insertion Date/Time: 09/13/2012 8:00 AM Performed by: Manuela Schwartz B Pre-anesthesia Checklist: Patient identified, Emergency Drugs available, Suction available, Patient being monitored and Timeout performed Patient Re-evaluated:Patient Re-evaluated prior to inductionOxygen Delivery Method: Circle system utilized Preoxygenation: Pre-oxygenation with 100% oxygen Intubation Type: IV induction LMA: LMA inserted LMA Size: 4.0 Number of attempts: 1 Placement Confirmation: positive ETCO2 and breath sounds checked- equal and bilateral Tube secured with: Tape Dental Injury: Teeth and Oropharynx as per pre-operative assessment

## 2012-09-13 NOTE — Anesthesia Postprocedure Evaluation (Signed)
Anesthesia Post Note  Patient: [name] [name]  Procedure(s) Performed: Procedure(s) (LRB): BASCILIC VEIN TRANSPOSITION (Left)  Anesthesia type: general  Patient location: PACU  Post pain: Pain level controlled  Post assessment: Patient's Cardiovascular Status Stable  Last Vitals:  Filed Vitals:   09/13/12 1058  BP:   Pulse: 63  Temp: 36.1 C  Resp: 16    Post vital signs: Reviewed and stable  Level of consciousness: sedated  Complications: No apparent anesthesia complications

## 2012-09-13 NOTE — H&P (Signed)
[facility] [place]  Brief History and Physical  History of Present Illness  [name] [name] is a 46 y.o. male who presents with chief complaint: ESRD.  The patient presents today for placement L arm fistula vs. AVG  Past Medical History  Diagnosis Date  . Hypertensive urgency   . Hypertension   . CKD (chronic kidney disease) stage 4, GFR 15-29 ml/min   . Systolic CHF     Ef [id number]  . Medical non-compliance   . Polysubstance abuse   . ESRD (end stage renal disease)     Past Surgical History  Procedure Date  . Insertion of dialysis catheter 07/17/2012    Procedure: INSERTION OF DIALYSIS CATHETER;  Surgeon: Conrad Klamath, MD;  Location: [facility];  Service: Vascular;  Laterality: Right;  right internal jugular    History   Social History  . Marital Status: Single    Spouse Name: N/A    Number of Children: N/A  . Years of Education: N/A   Occupational History  . Not on file.   Social History Main Topics  . Smoking status: Former Smoker    Types: Cigarettes    Quit date: 08/09/2012  . Smokeless tobacco: Not on file   Comment: Trying to quit; 3 cigs/last 2 weeks  . Alcohol Use: 4.2 oz/week    7 Cans of beer per week     Beer and liquor. Unknown qty. Patient not forthcoming  . Drug Use: Yes    Special: Marijuana     Marijuana  . Sexually Active: Not on file   Other Topics Concern  . Not on file   Social History Narrative  . No narrative on file    Family History  Problem Relation Age of Onset  . Hypertension Mother   . Hypertension Father     No current facility-administered medications on file prior to encounter.   Current Outpatient Prescriptions on File Prior to Encounter  Medication Sig Dispense Refill  . amLODipine (NORVASC) 10 MG tablet Take 10 mg by mouth daily.      . calcium acetate (PHOSLO) 667 MG capsule Take 1,334 mg by mouth 3 (three) times daily with meals.      . carvedilol (COREG) 12.5 MG tablet Take 25 mg by mouth 2  (two) times daily with a meal.      . cloNIDine (CATAPRES) 0.1 MG tablet Take 0.1 mg by mouth 2 (two) times daily.      . isosorbide mononitrate (IMDUR) 60 MG 24 hr tablet Take 60 mg by mouth daily.      Marland Kitchen lisinopril (PRINIVIL,ZESTRIL) 10 MG tablet Take 10 mg by mouth daily.        No Known Allergies  Review of Systems: As listed above, otherwise negative.  Physical Examination  Filed Vitals:   09/13/12 0613 09/13/12 0712  BP: 182/118 165/97  Pulse: 56   Temp: 97.9 F (36.6 C)   TempSrc: Oral   Resp: 20   SpO2: 100%     General: A&O x 3, WDWN  Pulmonary: Sym exp, good air movt, CTAB, no rales, rhonchi, & wheezing  Cardiac: RRR, Nl S1, S2, no Murmurs, rubs or gallops  Gastrointestinal: soft, NTND, -G/R, - HSM, - masses, - CVAT B  Musculoskeletal: M/S 5/5 throughout , Extremities without ischemic changes   Laboratory See iStat  Medical Decision Making  [name] [name] is a 46 y.o. male who presents with: ESRD.   The patient is scheduled for: L AVF  vs AVG  Risk, benefits, and alternatives to access surgery were discussed.  The patient is aware the risks include but are not limited to: bleeding, infection, steal syndrome, nerve damage, ischemic monomelic neuropathy, failure to mature, and need for additional procedures.  The patient is aware of the risks and agrees to proceed.  Adele Barthel, MD [facility] [place] Office: [phone] Pager: [phone]  09/13/2012, 7:28 AM

## 2012-09-13 NOTE — Op Note (Signed)
OPERATIVE NOTE   PROCEDURE: 1. left first stage basilic vein transposition (brachiobasilic arteriovenous fistula) placement  PRE-OPERATIVE DIAGNOSIS: end stage renal disease   POST-OPERATIVE DIAGNOSIS: same as above   SURGEON: Adele Barthel, MD  ASSISTANT(S): Gerri Lins, PAC   ANESTHESIA: general  ESTIMATED BLOOD LOSS: 30 cc  FINDING(S): 1. Somewhat thickened cephalic vein draining into basilic vein: 3 mm  2. Small brachial artery: 2.5 mm (possible early bifurcation)  SPECIMEN(S):  none  INDICATIONS:   [name] [name] is a 46 y.o. male who presents with end stage renal disease.  I had previously placed a first stage brachial vein transposition which thrombosed.  On venogram, the patient had a basilic and brachial vein still patent. The patient is scheduled for left first stage basilic vein transposition.  The patient is aware the risks include but are not limited to: bleeding, infection, steal syndrome, nerve damage, ischemic monomelic neuropathy, failure to mature, and need for additional procedures.  The patient is aware of the risks of the procedure and elects to proceed forward.  DESCRIPTION: After full informed written consent was obtained from the patient, the patient was brought back to the operating room and placed supine upon the operating table.  Prior to induction, the patient received IV antibiotics.   After obtaining adequate anesthesia, the patient was then prepped and draped in the standard fashion for a left arm access procedure.  I turned my attention first to identifying the patient's basilic vein and brachial artery.  Using SonoSite guidance, the location of these vessels were marked out on the skin.   At this point, I injected local anesthetic to obtain a field block of the antecubitum.  I made a transverse incision at the level of the antecubitum and dissected through the subcutaneous tissue and fascia to gain exposure of the brachial artery.  This was noted to be  2.5 mm in diameter externally.  This was dissected out proximally and distally and controlled with vessel loops .  I then started dissecting out the basilic vein.  In the process, I found a 3 mm cubital vein draining to the basilic vein.  This was likely the vein imaged on the venogram, given the proximity to the brachial artery.  This was noted to be 3 mm in diameter externally.  The distal segment of the vein was ligated with a  2-0 silk, and the vein was transected.  The proximal segment was iinterrogated with serial dilators.  The vein accepted up to a 4 mm dilator with some difficulty, even the somewhat sclerotic quality of the vein.  I then instilled the heparinized saline into the vein and clamped it.  At this point, I reset my exposure of the brachial artery and placed the artery under tension proximally and distally.  I made an arteriotomy with a #11 blade, and then I extended the arteriotomy with a Potts scissor.  I injected heparinized saline proximal and distal to this arteriotomy.  The vein was then sewn to the artery in an end-to-side configuration with a running stitch of 7-0 Prolene.  Prior to completing this anastomosis, I allowed the vein and artery to backbleed.  There was no evidence of clot from any vessels.  I completed the anastomosis in the usual fashion and then released all vessel loops and clamps.  There was a weakly palpable thrill in the venous outflow, and there was a dopplerable radial pulse.  At this point, I irrigated out the surgical wound.  There was no further active  bleeding.  The subcutaneous tissue was reapproximated with a running stitch of 3-0 Vicryl.  The skin was then reapproximated with a running subcuticular stitch of 4-0 Vicryl.  The skin was then cleaned, dried, and reinforced with Dermabond.  The patient tolerated this procedure well.   If this fistula fails also, I suspect the etiology is an early bifurcation given the small caliber of the antecubital  artery.  COMPLICATIONS: none  CONDITION: stable  Adele Barthel, MD [facility] [place] Office: [phone] Pager: [phone]  09/13/2012, 9:18 AM

## 2012-09-13 NOTE — Progress Notes (Signed)
WENT OVER D\C INST WITH PATIENT AND SISTER.  (PAIN RX GIVEN TO SISTER, MUPIROCIN OINTMENT AND INSTRUCTIONS , ANESTHESIA, MD INSTRUCTIONS GIVEN.

## 2012-09-13 NOTE — Telephone Encounter (Addendum)
Message copied by Doristine Section on Mon Sep 13, 2012 11:46 AM ------      Message from: Alfonso Patten      Created: Mon Sep 13, 2012 10:57 AM                   ----- Message -----         From: Conrad [place], MD         Sent: 09/13/2012   9:24 AM           To: Patrici Ranks, Alfonso Patten, RN            Zyonn Book      [id number]      1966-05-29            PROCEDURE:      left first stage basilic vein transposition (brachiobasilic arteriovenous fistula) placement            Asst: Gerri Lins, [facility]            Follow-up: 4 weeks        notified patient of fu appt. with blc 10-08-12 3:15

## 2012-09-13 NOTE — Progress Notes (Signed)
PT STATES PARENTS WORK ... HE WILL TRY TO GET HIS GIRLFRIEND TO COME STAY WITH HIM ... HIS SISTER WILL PICK HIM UP .

## 2012-10-07 ENCOUNTER — Encounter: Payer: Self-pay | Admitting: Vascular Surgery

## 2012-10-08 ENCOUNTER — Ambulatory Visit: Payer: Medicaid Other | Admitting: Vascular Surgery

## 2012-11-18 ENCOUNTER — Encounter: Payer: Self-pay | Admitting: Vascular Surgery

## 2012-11-19 ENCOUNTER — Ambulatory Visit: Payer: Medicaid Other | Admitting: Vascular Surgery

## 2012-11-25 ENCOUNTER — Encounter: Payer: Self-pay | Admitting: Vascular Surgery

## 2012-11-26 ENCOUNTER — Ambulatory Visit: Payer: Medicaid Other | Admitting: Vascular Surgery

## 2012-12-09 ENCOUNTER — Encounter: Payer: Self-pay | Admitting: Vascular Surgery

## 2012-12-10 ENCOUNTER — Ambulatory Visit: Payer: Medicaid Other | Admitting: Vascular Surgery

## 2012-12-21 ENCOUNTER — Telehealth: Payer: Self-pay | Admitting: Vascular Surgery

## 2012-12-21 NOTE — Telephone Encounter (Addendum)
Message copied by Lujean Amel on Tue Dec 21, 2012 11:05 AM ------      Message from: Denman George      Created: Tue Dec 21, 2012 10:43 AM      Regarding: needs f/u appt       Call Tamira at [facility]/ Dr. Moshe Cipro questioned what the next step is for pt./ I informed nurse that pt. Has either no-showed and cancelled multiple appts. For follow-up to the BVT done 09/13/12.  Nurse said if we make another appt. She will make sure he keeps it. Ph. [id number] Lorre Munroe, nurse @ [facility]) Thanks!  I spoke w/ Lorre Munroe to schedule the above pt for Friday 12/31/12 at 1pm to see [place]. She will notify pt and make arrangements for him to keep this appt.awt

## 2012-12-30 ENCOUNTER — Encounter: Payer: Self-pay | Admitting: Vascular Surgery

## 2012-12-31 ENCOUNTER — Ambulatory Visit: Payer: Medicaid Other | Admitting: Vascular Surgery

## 2013-01-13 ENCOUNTER — Encounter: Payer: Self-pay | Admitting: Vascular Surgery

## 2013-01-14 ENCOUNTER — Encounter: Payer: Self-pay | Admitting: Vascular Surgery

## 2013-01-14 ENCOUNTER — Ambulatory Visit (INDEPENDENT_AMBULATORY_CARE_PROVIDER_SITE_OTHER): Payer: Medicare Other | Admitting: Vascular Surgery

## 2013-01-14 VITALS — BP 101/60 | HR 81 | Ht 69.5 in | Wt 158.8 lb

## 2013-01-14 DIAGNOSIS — N186 End stage renal disease: Secondary | ICD-10-CM

## 2013-01-14 NOTE — Progress Notes (Signed)
[facility] [place]  Postoperative Access Visit  History of Present Illness  [name] [name] is a 47 y.o. year old male who presents for postoperative follow-up for: L 1st stage BVT (Date: 09/13/12).  The patient's wounds are healed.  The patient notes no steal symptoms.  The patient is able to complete their activities of daily living.  The patient's current symptoms are: bilateral fingertip intermittent numbness.  Physical Examination  Filed Vitals:   01/14/13 0839  BP: 101/60  Pulse: 81   LUE: Incision is healed, skin feels warm, hand grip is 5/5, sensation in digits is decreased B, palpable thrill, bruit can be auscultated , On sonosite: early drainage in brachial vein system, fistula appears 6-8 mm throughout  Medical Decision Making  [name] [name] is a 47 y.o. year old male who presents s/p L 1st stage BVT.  The patient has been lost to follow-up for a few months, but this has worked to his advantage.  The patient is ready for the transposition of the BVT now.  This is scheduled for 5 FEB 14. Risk, benefits, and alternatives to access surgery were discussed.  The patient is aware the risks include but are not limited to: bleeding, infection, steal syndrome, nerve damage, ischemic monomelic neuropathy, failure to mature, need for additional procedures, death and stroke.   The patient agrees to proceed forward with the procedure.  Thank you for allowing [place] to participate in this patient's care.  Adele Barthel, MD [facility] [place] Office: [phone] Pager: [phone]

## 2013-01-17 ENCOUNTER — Encounter (HOSPITAL_COMMUNITY): Payer: Self-pay | Admitting: Respiratory Therapy

## 2013-01-17 ENCOUNTER — Other Ambulatory Visit: Payer: Self-pay

## 2013-01-18 MED ORDER — DEXTROSE 5 % IV SOLN
1.5000 g | INTRAVENOUS | Status: AC
  Administered 2013-01-19: 1.5 g via INTRAVENOUS
  Filled 2013-01-18: qty 1.5

## 2013-01-19 ENCOUNTER — Encounter (HOSPITAL_COMMUNITY): Payer: Self-pay | Admitting: *Deleted

## 2013-01-19 ENCOUNTER — Encounter (HOSPITAL_COMMUNITY): Payer: Self-pay | Admitting: Anesthesiology

## 2013-01-19 ENCOUNTER — Ambulatory Visit (HOSPITAL_COMMUNITY)
Admission: RE | Admit: 2013-01-19 | Discharge: 2013-01-19 | Disposition: A | Discharge status: Home / Self Care | Payer: Medicare Other | Source: Ambulatory Visit | Attending: Vascular Surgery | Admitting: Vascular Surgery

## 2013-01-19 ENCOUNTER — Encounter (HOSPITAL_COMMUNITY)
Admission: RE | Disposition: A | Discharge status: Home / Self Care | Payer: Self-pay | Source: Ambulatory Visit | Attending: Vascular Surgery

## 2013-01-19 ENCOUNTER — Ambulatory Visit (HOSPITAL_COMMUNITY): Payer: Medicare Other | Admitting: Anesthesiology

## 2013-01-19 DIAGNOSIS — Z91199 Patient's noncompliance with other medical treatment and regimen due to unspecified reason: Secondary | ICD-10-CM | POA: Insufficient documentation

## 2013-01-19 DIAGNOSIS — F17200 Nicotine dependence, unspecified, uncomplicated: Secondary | ICD-10-CM | POA: Diagnosis not present

## 2013-01-19 DIAGNOSIS — Z9119 Patient's noncompliance with other medical treatment and regimen: Secondary | ICD-10-CM | POA: Insufficient documentation

## 2013-01-19 DIAGNOSIS — N19 Unspecified kidney failure: Secondary | ICD-10-CM

## 2013-01-19 DIAGNOSIS — I120 Hypertensive chronic kidney disease with stage 5 chronic kidney disease or end stage renal disease: Secondary | ICD-10-CM | POA: Insufficient documentation

## 2013-01-19 DIAGNOSIS — N186 End stage renal disease: Secondary | ICD-10-CM | POA: Insufficient documentation

## 2013-01-19 HISTORY — PX: BASCILIC VEIN TRANSPOSITION: SHX5742

## 2013-01-19 LAB — POCT I-STAT 4, (NA,K, GLUC, HGB,HCT)
Glucose, Bld: 97 mg/dL (ref 70–99)
HCT: 36 % — ABNORMAL LOW (ref 39.0–52.0)
Potassium: 4.9 mEq/L (ref 3.5–5.1)
Sodium: 139 mEq/L (ref 135–145)

## 2013-01-19 LAB — SURGICAL PCR SCREEN
MRSA, PCR: NEGATIVE
Staphylococcus aureus: NEGATIVE

## 2013-01-19 SURGERY — TRANSPOSITION, VEIN, BASILIC
Anesthesia: General | Site: Arm Upper | Laterality: Left | Wound class: Clean

## 2013-01-19 MED ORDER — PROPOFOL 10 MG/ML IV BOLUS
INTRAVENOUS | Status: DC | PRN
  Administered 2013-01-19: 180 mg via INTRAVENOUS

## 2013-01-19 MED ORDER — THROMBIN 20000 UNITS EX SOLR
CUTANEOUS | Status: AC
  Filled 2013-01-19: qty 20000

## 2013-01-19 MED ORDER — OXYCODONE HCL 5 MG PO TABS
ORAL_TABLET | ORAL | Status: AC
  Filled 2013-01-19: qty 1

## 2013-01-19 MED ORDER — OXYCODONE HCL 5 MG PO TABS: 5.0000 mg | ORAL_TABLET | ORAL | Status: DC | PRN

## 2013-01-19 MED ORDER — HYDROMORPHONE HCL PF 1 MG/ML IJ SOLN
INTRAMUSCULAR | Status: AC
  Administered 2013-01-19: 0.5 mg via INTRAVENOUS
  Filled 2013-01-19: qty 1

## 2013-01-19 MED ORDER — OXYCODONE HCL 5 MG PO TABS
ORAL_TABLET | ORAL | Status: AC
  Administered 2013-01-19: 5 mg
  Filled 2013-01-19: qty 1

## 2013-01-19 MED ORDER — BUPIVACAINE HCL (PF) 0.5 % IJ SOLN
INTRAMUSCULAR | Status: AC
  Filled 2013-01-19: qty 30

## 2013-01-19 MED ORDER — PHENYLEPHRINE HCL 10 MG/ML IJ SOLN
INTRAMUSCULAR | Status: DC | PRN
  Administered 2013-01-19 (×2): 80 ug via INTRAVENOUS

## 2013-01-19 MED ORDER — LIDOCAINE HCL (CARDIAC) 10 MG/ML IV SOLN
INTRAVENOUS | Status: DC | PRN
  Administered 2013-01-19: 50 mg via INTRAVENOUS

## 2013-01-19 MED ORDER — FENTANYL CITRATE 0.05 MG/ML IJ SOLN
INTRAMUSCULAR | Status: DC | PRN
  Administered 2013-01-19: 100 ug via INTRAVENOUS
  Administered 2013-01-19 (×3): 50 ug via INTRAVENOUS

## 2013-01-19 MED ORDER — SODIUM CHLORIDE 0.9 % IV SOLN
INTRAVENOUS | Status: DC
  Administered 2013-01-19 (×2): via INTRAVENOUS

## 2013-01-19 MED ORDER — OXYCODONE HCL 5 MG/5ML PO SOLN: 5.0000 mg | Freq: Once | ORAL | Status: AC | PRN

## 2013-01-19 MED ORDER — ALBUMIN HUMAN 5 % IV SOLN
INTRAVENOUS | Status: DC | PRN
  Administered 2013-01-19: 16:00:00 via INTRAVENOUS

## 2013-01-19 MED ORDER — OXYCODONE HCL 5 MG PO TABS
5.0000 mg | ORAL_TABLET | Freq: Once | ORAL | Status: AC | PRN
  Administered 2013-01-19: 5 mg via ORAL

## 2013-01-19 MED ORDER — PROMETHAZINE HCL 25 MG/ML IJ SOLN: 6.2500 mg | INTRAMUSCULAR | Status: DC | PRN

## 2013-01-19 MED ORDER — HYDROMORPHONE HCL PF 1 MG/ML IJ SOLN
0.2500 mg | INTRAMUSCULAR | Status: DC | PRN
  Administered 2013-01-19: 0.5 mg via INTRAVENOUS

## 2013-01-19 MED ORDER — SODIUM CHLORIDE 0.9 % IR SOLN
Status: DC | PRN
  Administered 2013-01-19: 15:00:00

## 2013-01-19 MED ORDER — MUPIROCIN CALCIUM 2 % EX CREA
TOPICAL_CREAM | Freq: Two times a day (BID) | CUTANEOUS | Status: DC
  Filled 2013-01-19: qty 15

## 2013-01-19 MED ORDER — THROMBIN 20000 UNITS EX SOLR
CUTANEOUS | Status: DC | PRN
  Administered 2013-01-19 (×2): via TOPICAL

## 2013-01-19 MED ORDER — MUPIROCIN 2 % EX OINT
TOPICAL_OINTMENT | CUTANEOUS | Status: AC
  Administered 2013-01-19: 1 via NASAL
  Filled 2013-01-19: qty 22

## 2013-01-19 MED ORDER — 0.9 % SODIUM CHLORIDE (POUR BTL) OPTIME
TOPICAL | Status: DC | PRN
  Administered 2013-01-19: 1000 mL

## 2013-01-19 MED ORDER — LIDOCAINE-EPINEPHRINE (PF) 1 %-1:200000 IJ SOLN
INTRAMUSCULAR | Status: AC
  Filled 2013-01-19: qty 10

## 2013-01-19 SURGICAL SUPPLY — 39 items
CANISTER SUCTION 2500CC (MISCELLANEOUS) ×2 IMPLANT
CLIP TI MEDIUM 24 (CLIP) ×2 IMPLANT
CLIP TI WIDE RED SMALL 24 (CLIP) ×2 IMPLANT
CLOTH BEACON ORANGE TIMEOUT ST (SAFETY) ×2 IMPLANT
CORDS BIPOLAR (ELECTRODE) IMPLANT
COVER PROBE W GEL 5X96 (DRAPES) IMPLANT
COVER SURGICAL LIGHT HANDLE (MISCELLANEOUS) ×2 IMPLANT
DECANTER SPIKE VIAL GLASS SM (MISCELLANEOUS) IMPLANT
DERMABOND ADVANCED (GAUZE/BANDAGES/DRESSINGS) ×1
DERMABOND ADVANCED .7 DNX12 (GAUZE/BANDAGES/DRESSINGS) ×1 IMPLANT
DRAIN PENROSE 1/2X12 LTX STRL (WOUND CARE) IMPLANT
ELECT REM PT RETURN 9FT ADLT (ELECTROSURGICAL) ×2
ELECTRODE REM PT RTRN 9FT ADLT (ELECTROSURGICAL) ×1 IMPLANT
GLOVE BIO SURGEON STRL SZ 6 (GLOVE) ×2 IMPLANT
GLOVE BIO SURGEON STRL SZ 6.5 (GLOVE) ×2 IMPLANT
GLOVE BIO SURGEON STRL SZ7 (GLOVE) ×2 IMPLANT
GLOVE BIOGEL PI IND STRL 7.0 (GLOVE) ×1 IMPLANT
GLOVE BIOGEL PI IND STRL 7.5 (GLOVE) ×2 IMPLANT
GLOVE BIOGEL PI INDICATOR 7.0 (GLOVE) ×1
GLOVE BIOGEL PI INDICATOR 7.5 (GLOVE) ×2
GLOVE SURG SS PI 7.0 STRL IVOR (GLOVE) ×4 IMPLANT
GOWN STRL NON-REIN LRG LVL3 (GOWN DISPOSABLE) ×6 IMPLANT
KIT BASIN OR (CUSTOM PROCEDURE TRAY) ×2 IMPLANT
KIT ROOM TURNOVER OR (KITS) ×2 IMPLANT
NS IRRIG 1000ML POUR BTL (IV SOLUTION) ×2 IMPLANT
PACK CV ACCESS (CUSTOM PROCEDURE TRAY) ×2 IMPLANT
PAD ARMBOARD 7.5X6 YLW CONV (MISCELLANEOUS) ×4 IMPLANT
SLEEVE SURGEON STRL (DRAPES) ×4 IMPLANT
SPONGE SURGIFOAM ABS GEL 100 (HEMOSTASIS) ×4 IMPLANT
SUT MNCRL AB 4-0 PS2 18 (SUTURE) ×4 IMPLANT
SUT PROLENE 6 0 BV (SUTURE) ×2 IMPLANT
SUT PROLENE 7 0 BV 1 (SUTURE) IMPLANT
SUT SILK 2 0 SH (SUTURE) IMPLANT
SUT VIC AB 3-0 SH 27 (SUTURE) ×5
SUT VIC AB 3-0 SH 27X BRD (SUTURE) ×5 IMPLANT
TOWEL OR 17X24 6PK STRL BLUE (TOWEL DISPOSABLE) ×2 IMPLANT
TOWEL OR 17X26 10 PK STRL BLUE (TOWEL DISPOSABLE) ×2 IMPLANT
UNDERPAD 30X30 INCONTINENT (UNDERPADS AND DIAPERS) ×2 IMPLANT
WATER STERILE IRR 1000ML POUR (IV SOLUTION) ×2 IMPLANT

## 2013-01-19 NOTE — Op Note (Signed)
OPERATIVE NOTE   PROCEDURE: left second stage basilic vein transposition (ulnar/radial-basilic arteriovenous fistula) placement  PRE-OPERATIVE DIAGNOSIS: end stage renal disease   POST-OPERATIVE DIAGNOSIS: same as above   SURGEON: Adele Barthel, MD  ASSISTANT(S): Wray Kearns, [facility]   ANESTHESIA: none  ESTIMATED BLOOD LOSS: 50 cc  FINDING(S): 1.  Palpable thrill and palpable radial artery  2.  Brachial bifurcation in distal upper arm  SPECIMEN(S):  none  INDICATIONS:   [name] [name] is a 47 y.o. male who presents with end stage renal disease and s/p successful left first stage basilic vein transposition.  The patient is scheduled for left second stage basilic vein transposition.  The patient is aware the risks include but are not limited to: bleeding, infection, steal syndrome, nerve damage, ischemic monomelic neuropathy, failure to mature, and need for additional procedures.  The patient is aware of the risks of the procedure and elects to proceed forward.  DESCRIPTION: After full informed written consent was obtained from the patient, the patient was brought back to the operating room and placed supine upon the operating table.  Prior to induction, the patient received IV antibiotics.   After obtaining adequate anesthesia, the patient was then prepped and draped in the standard fashion for a left arm access procedure.  I turned my attention first to identifying the patient's brachiobasilic arteriovenous fistula.  Using SonoSite guidance, the location of this fistula was marked out on the skin.   I made an longitudinal incision over the fistula from its arterial anastomosis up to its axillary extent.  I carefully dissected the fistula away from its adjacent nerves.  Eventually the entirety of this fistula was mobilized and I dissected a plane on top of the bicipital fascia with electrocautery.  Note, in this process of dissection, I exposed the brachial artery bifurcation into the ulnar  and radial arteries in the upper arm.  Also with electrocautery, I developed a curvilinear subcutaneous trough in which to transpose the fistula into.  The fistula was secured in its new location with loosely placed interrupt 3-0 Vicryl stitches, taking care to avoid compressing the fistula.  The deep subcutaneous tissue was inspected for bleeding.  Bleeding was controlled with electrocautery and placement of large pieces of thrombin and gelfoam.  I washed out the surgical site after waiting a few minutes, and there was no further bleeding.  The deep subcutaneous tissue was reapproximated with mattress sutures of 3-0 Vicryl to eliminate some of the dead space.  The superficial subcutaneous tissue was then reapproximated along the incision line with a running stitch of 3-0 Vicryl.  The skin was then reapproximated with a running subcuticular of 4-0 Monocryl.  The skin was then cleaned, dried, and reinforced with Dermabond.  The patient tolerated this procedure well.   COMPLICATIONS: none  CONDITION: stable  Adele Barthel, MD [facility] [place] Office: [phone] Pager: [phone]  01/19/2013, 5:01 PM

## 2013-01-19 NOTE — Anesthesia Procedure Notes (Signed)
Procedure Name: LMA Insertion Date/Time: 01/19/2013 3:19 PM Performed by: Eligha Bridegroom Pre-anesthesia Checklist: Timeout performed, Patient identified, Emergency Drugs available, Suction available and Patient being monitored Patient Re-evaluated:Patient Re-evaluated prior to inductionOxygen Delivery Method: Circle system utilized Preoxygenation: Pre-oxygenation with 100% oxygen Intubation Type: IV induction LMA: LMA inserted LMA Size: 5.0 Number of attempts: 1 Placement Confirmation: positive ETCO2 and breath sounds checked- equal and bilateral Tube secured with: Tape Dental Injury: Teeth and Oropharynx as per pre-operative assessment

## 2013-01-19 NOTE — H&P (Signed)
[facility] [place]  Brief History and Physical  History of Present Illness  [name] [name] is a 47 y.o. male who presents with chief complaint: end stage renal disease.  The patient presents today for L 2nd stage BVT after successful maturation of 1st BVT.    Past Medical History  Diagnosis Date  . Hypertensive urgency   . Hypertension   . CKD (chronic kidney disease) stage 4, GFR 15-29 ml/min   . Systolic CHF     Ef [id number]  . Medical non-compliance   . Polysubstance abuse   . ESRD (end stage renal disease)     Past Surgical History  Procedure Date  . Insertion of dialysis catheter 07/17/2012    Procedure: INSERTION OF DIALYSIS CATHETER;  Surgeon: Conrad Hinckley, MD;  Location: [facility];  Service: Vascular;  Laterality: Right;  right internal jugular    History   Social History  . Marital Status: Single    Spouse Name: N/A    Number of Children: N/A  . Years of Education: N/A   Occupational History  . Not on file.   Social History Main Topics  . Smoking status: Former Smoker    Types: Cigarettes    Quit date: 08/09/2012  . Smokeless tobacco: Not on file     Comment: Trying to quit; 3 cigs/last 2 weeks  . Alcohol Use: 4.2 oz/week    7 Cans of beer per week     Comment: Beer and liquor. Unknown qty. Patient not forthcoming  . Drug Use: Yes    Special: Marijuana     Comment: Marijuana  . Sexually Active: Not on file   Other Topics Concern  . Not on file   Social History Narrative  . No narrative on file    Family History  Problem Relation Age of Onset  . Hypertension Mother   . Hypertension Father     No current facility-administered medications on file prior to encounter.   Current Outpatient Prescriptions on File Prior to Encounter  Medication Sig Dispense Refill  . calcium acetate (PHOSLO) 667 MG capsule Take 1,334 mg by mouth 3 (three) times daily with meals.      . carvedilol (COREG) 12.5 MG tablet Take 25 mg by mouth 2 (two)  times daily with a meal.      . isosorbide mononitrate (IMDUR) 60 MG 24 hr tablet Take 60 mg by mouth daily.      Marland Kitchen lisinopril (PRINIVIL,ZESTRIL) 10 MG tablet Take 10 mg by mouth daily.        No Known Allergies  Review of Systems: As listed above, otherwise negative.  Physical Examination  Filed Vitals:   01/19/13 0928  BP: 113/75  Pulse: 81  Temp: 97.8 F (36.6 C)  TempSrc: Oral  Resp: 20  Height: 5' 9.5" (1.765 m)  Weight: 151 lb 14.4 oz (68.9 kg)  SpO2: 99%    General: A&O x 3, WDWN  Pulmonary: Sym exp, good air movt, CTAB, no rales, rhonchi, & wheezing  Cardiac: RRR, Nl S1, S2, no Murmurs, rubs or gallops  Gastrointestinal: soft, NTND, -G/R, - HSM, - masses, - CVAT B  Musculoskeletal: M/S 5/5 throughout , Extremities without ischemic changes , palpable thrill in L arm  Laboratory See [place]  [name] [name] is a 47 y.o. male who presents with: end stage renal disease, successful L 1st stage BVT.   The patient is scheduled for: L 2nd stage BVT (actually basiliobrachial transposition  given early drainage into brachial vein system).  Risk, benefits, and alternatives to access surgery were discussed.  The patient is aware the risks include but are not limited to: bleeding, infection, steal syndrome, nerve damage, ischemic monomelic neuropathy, failure to mature, and need for additional procedures.  The patient is aware of that a long incision is necessary to accomplish the transposition.  The patient is aware of the risks and agrees to proceed.  Adele Barthel, MD [facility] [place] Office: [phone] Pager: [phone]  01/19/2013, 8:02 AM

## 2013-01-19 NOTE — Anesthesia Preprocedure Evaluation (Addendum)
Anesthesia Evaluation  Patient identified by MRN, date of birth, ID band Patient awake    Reviewed: Allergy & Precautions, H&P , NPO status , Patient's Chart, lab work & pertinent test results  History of Anesthesia Complications Negative for: history of anesthetic complications  Airway Mallampati: II TM Distance: >3 FB Neck ROM: Full    Dental  (+) Poor Dentition   Pulmonary Current Smoker,    Pulmonary exam normal       Cardiovascular hypertension, Pt. on medications and Pt. on home beta blockers  EF 30-35%   Neuro/Psych negative neurological ROS  negative psych ROS   GI/Hepatic negative GI ROS, (+)     substance abuse   ,   Endo/Other  negative endocrine ROS  Renal/GU ESRFRenal disease     Musculoskeletal   Abdominal   Peds  Hematology negative hematology ROS (+)   Anesthesia Other Findings   Reproductive/Obstetrics                          Anesthesia Physical Anesthesia Plan  ASA: III  Anesthesia Plan: General   Post-op Pain Management:    Induction: Intravenous  Airway Management Planned: LMA  Additional Equipment:   Intra-op Plan:   Post-operative Plan: Extubation in OR  Informed Consent: I have reviewed the patients History and Physical, chart, labs and discussed the procedure including the risks, benefits and alternatives for the proposed anesthesia with the patient or authorized representative who has indicated his/her understanding and acceptance.   Dental advisory given  Plan Discussed with: CRNA, Anesthesiologist and Surgeon  Anesthesia Plan Comments:        Anesthesia Quick Evaluation

## 2013-01-19 NOTE — Anesthesia Postprocedure Evaluation (Signed)
Anesthesia Post Note  Patient: [name] [name]  Procedure(s) Performed: Procedure(s) (LRB): BASCILIC VEIN TRANSPOSITION (Left)  Anesthesia type: general  Patient location: PACU  Post pain: Pain level controlled  Post assessment: Patient's Cardiovascular Status Stable  Last Vitals:  Filed Vitals:   01/19/13 1800  BP: 142/92  Pulse: 66  Temp:   Resp: 15    Post vital signs: Reviewed and stable  Level of consciousness: sedated  Complications: No apparent anesthesia complications

## 2013-01-19 NOTE — Transfer of Care (Signed)
Immediate Anesthesia Transfer of Care Note  Patient: [name] [name]  Procedure(s) Performed: Procedure(s) (LRB) with comments: BASCILIC VEIN TRANSPOSITION (Left) - left 2nd stage basilic vein transposition  Patient Location: PACU  Anesthesia Type:General  Level of Consciousness: awake and alert   Airway & Oxygen Therapy: Patient Spontanous Breathing and Patient connected to nasal cannula oxygen  Post-op Assessment: Report given to PACU RN and Post -op Vital signs reviewed and stable  Post vital signs: Reviewed and stable  Complications: No apparent anesthesia complications

## 2013-01-20 ENCOUNTER — Telehealth: Payer: Self-pay | Admitting: Vascular Surgery

## 2013-01-20 NOTE — Telephone Encounter (Addendum)
Message copied by Lujean Amel on Thu Jan 20, 2013 12:38 PM ------      Message from: Denman George      Created: Thu Jan 20, 2013 11:51 AM      Regarding: 2-3 wk f/u                   ----- Message -----         From: Richrd Prime, PA         Sent: 01/19/2013   5:06 PM           To: Alfonso Patten, RN            2-3 weeks BVT 2nd stage Bridgett Larsson  I scheduled an appt for the above pt on 02/18/13 at 12:30pm w/ [facility]. I mailed an appt letter to the pt but was unable to leave a message-the phone mailbox was full/awt

## 2013-01-21 ENCOUNTER — Encounter (HOSPITAL_COMMUNITY): Payer: Self-pay | Admitting: Vascular Surgery

## 2013-02-17 ENCOUNTER — Encounter: Payer: Self-pay | Admitting: Vascular Surgery

## 2013-02-18 ENCOUNTER — Ambulatory Visit: Payer: Medicare Other | Admitting: Vascular Surgery

## 2013-02-24 ENCOUNTER — Encounter: Payer: Self-pay | Admitting: Vascular Surgery

## 2013-02-25 ENCOUNTER — Ambulatory Visit: Payer: Medicare Other | Admitting: Vascular Surgery

## 2013-03-03 ENCOUNTER — Encounter: Payer: Self-pay | Admitting: Vascular Surgery

## 2013-03-04 ENCOUNTER — Ambulatory Visit: Payer: Medicare Other | Admitting: Vascular Surgery

## 2013-03-25 ENCOUNTER — Encounter (HOSPITAL_COMMUNITY): Payer: Self-pay | Admitting: Emergency Medicine

## 2013-03-25 ENCOUNTER — Emergency Department (INDEPENDENT_AMBULATORY_CARE_PROVIDER_SITE_OTHER)
Admission: EM | Admit: 2013-03-25 | Discharge: 2013-03-25 | Disposition: A | Discharge status: Home / Self Care | Payer: Medicare Other | Source: Home / Self Care | Attending: Family Medicine | Admitting: Family Medicine

## 2013-03-25 DIAGNOSIS — G2581 Restless legs syndrome: Secondary | ICD-10-CM

## 2013-03-25 MED ORDER — CLONAZEPAM 0.5 MG PO TABS: 0.5000 mg | ORAL_TABLET | Freq: Every evening | ORAL | Status: DC | PRN

## 2013-03-25 NOTE — ED Notes (Signed)
Patient has been to dialysis today .  Patient reports for about 4 weeks have had "tickly" feeling in both legs.  Reports difficulty sleeping, unable to get comfortable.  .  Patient denies feeling anxious.

## 2013-03-25 NOTE — ED Provider Notes (Signed)
History     CSN: [id number]  Arrival date & time 03/25/13  1144   First MD Initiated Contact with Patient 03/25/13 1153      No chief complaint on file.   (Consider location/radiation/quality/duration/timing/severity/associated sxs/prior treatment) Patient is a 47 y.o. male presenting with general illness. The history is provided by the patient.  Illness  The current episode started more than 2 weeks ago (itching, tingling, crawling bilat sensation.). The onset is undetermined. The problem occurs continuously. The problem has been gradually worsening. The problem is mild. Associated symptoms include muscle aches. Pertinent negatives include no fever. Recent Medical Care: dialysis.    Past Medical History  Diagnosis Date  . Hypertensive urgency   . Hypertension   . CKD (chronic kidney disease) stage 4, GFR 15-29 ml/min   . Systolic CHF     Ef [id number]  . Medical non-compliance   . Polysubstance abuse   . ESRD (end stage renal disease)     Past Surgical History  Procedure Laterality Date  . Insertion of dialysis catheter  07/17/2012    Procedure: INSERTION OF DIALYSIS CATHETER;  Surgeon: Conrad Linganore, MD;  Location: [facility];  Service: Vascular;  Laterality: Right;  right internal jugular  . Bascilic vein transposition  01/19/2013    Procedure: BASCILIC VEIN TRANSPOSITION;  Surgeon: Conrad Hobson, MD;  Location: [facility];  Service: Vascular;  Laterality: Left;  left 2nd stage basilic vein transposition    Family History  Problem Relation Age of Onset  . Hypertension Mother   . Hypertension Father     History  Substance Use Topics  . Smoking status: Former Smoker    Types: Cigarettes    Quit date: 08/09/2012  . Smokeless tobacco: Not on file     Comment: Trying to quit; 3 cigs/last 2 weeks  . Alcohol Use: 4.2 oz/week    7 Cans of beer per week     Comment: Beer and liquor. Unknown qty. Patient not forthcoming      Review of Systems  Constitutional: Negative.  Negative for  fever.  Musculoskeletal: Negative.   Skin: Negative.     Allergies  Review of patient's allergies indicates no known allergies.  Home Medications   Current Outpatient Rx  Name  Route  Sig  Dispense  Refill  . calcium acetate (PHOSLO) 667 MG capsule   Oral   Take 1,334 mg by mouth 3 (three) times daily with meals.         . carvedilol (COREG) 12.5 MG tablet   Oral   Take 25 mg by mouth 2 (two) times daily with a meal.         . isosorbide mononitrate (IMDUR) 60 MG 24 hr tablet   Oral   Take 60 mg by mouth daily.         Marland Kitchen lisinopril (PRINIVIL,ZESTRIL) 10 MG tablet   Oral   Take 10 mg by mouth daily.         Marland Kitchen oxyCODONE (ROXICODONE) 5 MG immediate release tablet   Oral   Take 1-2 tablets (5-10 mg total) by mouth every 4 (four) hours as needed for pain.   30 tablet   0     BP 169/113  Pulse 70  Temp(Src) 98.2 F (36.8 C) (Oral)  Resp 16  SpO2 99%  Physical Exam  Nursing note and vitals reviewed. Constitutional: He is oriented to person, place, and time. He appears well-developed and well-nourished.  Musculoskeletal: He exhibits no edema and  no tenderness.  Neurological: He is alert and oriented to person, place, and time.  Skin: Skin is warm and dry.    ED Course  Procedures (including critical care time)  Labs Reviewed - No data to display No results found.   No diagnosis found.    MDM          Billy Fischer, MD 03/25/13 [phone]

## 2013-04-19 ENCOUNTER — Other Ambulatory Visit: Payer: Self-pay | Admitting: *Deleted

## 2013-04-21 ENCOUNTER — Other Ambulatory Visit (HOSPITAL_COMMUNITY): Payer: Self-pay | Admitting: *Deleted

## 2013-04-22 ENCOUNTER — Ambulatory Visit (HOSPITAL_COMMUNITY)
Admission: RE | Admit: 2013-04-22 | Discharge: 2013-04-22 | Disposition: A | Discharge status: Home / Self Care | Payer: Medicare Other | Source: Ambulatory Visit | Attending: Vascular Surgery | Admitting: Vascular Surgery

## 2013-04-22 DIAGNOSIS — I5020 Unspecified systolic (congestive) heart failure: Secondary | ICD-10-CM | POA: Insufficient documentation

## 2013-04-22 DIAGNOSIS — Z9119 Patient's noncompliance with other medical treatment and regimen: Secondary | ICD-10-CM | POA: Insufficient documentation

## 2013-04-22 DIAGNOSIS — I120 Hypertensive chronic kidney disease with stage 5 chronic kidney disease or end stage renal disease: Secondary | ICD-10-CM | POA: Insufficient documentation

## 2013-04-22 DIAGNOSIS — Z91199 Patient's noncompliance with other medical treatment and regimen due to unspecified reason: Secondary | ICD-10-CM | POA: Insufficient documentation

## 2013-04-22 DIAGNOSIS — Z87891 Personal history of nicotine dependence: Secondary | ICD-10-CM | POA: Insufficient documentation

## 2013-04-22 DIAGNOSIS — Z79899 Other long term (current) drug therapy: Secondary | ICD-10-CM | POA: Insufficient documentation

## 2013-04-22 DIAGNOSIS — Z452 Encounter for adjustment and management of vascular access device: Secondary | ICD-10-CM | POA: Insufficient documentation

## 2013-04-22 DIAGNOSIS — F1210 Cannabis abuse, uncomplicated: Secondary | ICD-10-CM | POA: Insufficient documentation

## 2013-04-22 DIAGNOSIS — N186 End stage renal disease: Secondary | ICD-10-CM | POA: Insufficient documentation

## 2013-04-22 NOTE — Progress Notes (Signed)
Right chest dressing CDI and pt D/C home

## 2013-04-22 NOTE — Progress Notes (Signed)
[facility] SHORT STAY H&P  CC:  Catheter removal   HPI:  This is a 47 y.o. male here for diatek catheter removal.  He underwent a 2nd stage left BVT 01/19/13.  They have been using his BVT for about a month and a half without difficulty. He had his right IJ diatek placed 07/15/12.   Past Medical History  Diagnosis Date  . Hypertensive urgency   . Hypertension   . CKD (chronic kidney disease) stage 4, GFR 15-29 ml/min   . Systolic CHF     Ef [id number]  . Medical non-compliance   . Polysubstance abuse   . ESRD (end stage renal disease)     FH:  Non-Contributory  History   Social History  . Marital Status: Single    Spouse Name: N/A    Number of Children: N/A  . Years of Education: N/A   Occupational History  . Not on file.   Social History Main Topics  . Smoking status: Former Smoker    Types: Cigarettes    Quit date: 08/09/2012  . Smokeless tobacco: Not on file     Comment: Trying to quit; 3 cigs/last 2 weeks  . Alcohol Use: 4.2 oz/week    7 Cans of beer per week     Comment: Beer and liquor. Unknown qty. Patient not forthcoming  . Drug Use: Yes    Special: Marijuana     Comment: Marijuana  . Sexually Active: Not on file   Other Topics Concern  . Not on file   Social History Narrative  . No narrative on file    No Known Allergies  Current Outpatient Prescriptions  Medication Sig Dispense Refill  . calcium acetate (PHOSLO) 667 MG capsule Take 1,334 mg by mouth 3 (three) times daily with meals.      . carvedilol (COREG) 12.5 MG tablet Take 25 mg by mouth 2 (two) times daily with a meal.      . clonazePAM (KLONOPIN) 0.5 MG tablet Take 1 tablet (0.5 mg total) by mouth at bedtime as needed for anxiety.  30 tablet  1  . isosorbide mononitrate (IMDUR) 60 MG 24 hr tablet Take 60 mg by mouth daily.      Marland Kitchen lisinopril (PRINIVIL,ZESTRIL) 10 MG tablet Take 10 mg by mouth daily.      Marland Kitchen oxyCODONE (ROXICODONE) 5 MG immediate release tablet Take 1-2 tablets  (5-10 mg total) by mouth every 4 (four) hours as needed for pain.  30 tablet  0   No current facility-administered medications for this encounter.    ROS:  See HPI  PHYSICAL EXAM  There were no vitals filed for this visit.  Gen:  Well developed well nourished HEENT:  normocephalic Neck:  Right IJ diatek catheter Heart:  RRR Lungs:  Non-labored Abdomen:  Soft NT/ND Extremities:  + thrill and bruit in left BVT Skin:  No obvious rashes Neuro:  In tact  Lab/X-ray:  none  Impression: This is a 47 y.o. male here for diatek catheter removal  Plan:  Removal of right diatek catheter   Leontine Locket, PA-C [facility] [phone] 04/22/2013 12:53 PM

## 2013-04-22 NOTE — Progress Notes (Signed)
[facility] Catheter Removal Procedure Note  Diagnosis: ESRD  Plan:  Remove right diatek catheter  Consent signed:  yes Time out completed:  yes Coumadin:  no PT/INR (if applicable):   Other labs:  Procedure: 1.  Sterile prepping and draping over catheter area 2. 6 ml 2% lidocaine plain instilled at removal site. 3.  right catheter removed in its entirety with cuff in tact. 4.  Complications:  none 5. Tip of catheter sent for culture:  no   Patient tolerated procedure well:  yes Pressure held, no bleeding noted, dressing applied Instructions given to the pt regarding wound care and bleeding.  Other:  Leontine Locket, PA-C 04/22/2013 1:05 PM

## 2013-08-15 DIAGNOSIS — D509 Iron deficiency anemia, unspecified: Secondary | ICD-10-CM | POA: Diagnosis not present

## 2013-08-15 DIAGNOSIS — N2581 Secondary hyperparathyroidism of renal origin: Secondary | ICD-10-CM | POA: Diagnosis not present

## 2013-08-15 DIAGNOSIS — Z992 Dependence on renal dialysis: Secondary | ICD-10-CM | POA: Diagnosis not present

## 2013-08-15 DIAGNOSIS — D631 Anemia in chronic kidney disease: Secondary | ICD-10-CM | POA: Diagnosis not present

## 2013-08-15 DIAGNOSIS — Z23 Encounter for immunization: Secondary | ICD-10-CM | POA: Diagnosis not present

## 2013-08-15 DIAGNOSIS — N186 End stage renal disease: Secondary | ICD-10-CM | POA: Diagnosis not present

## 2013-10-17 DIAGNOSIS — D631 Anemia in chronic kidney disease: Secondary | ICD-10-CM | POA: Diagnosis not present

## 2013-10-17 DIAGNOSIS — N186 End stage renal disease: Secondary | ICD-10-CM | POA: Diagnosis not present

## 2013-10-17 DIAGNOSIS — N2581 Secondary hyperparathyroidism of renal origin: Secondary | ICD-10-CM | POA: Diagnosis not present

## 2013-10-17 DIAGNOSIS — D509 Iron deficiency anemia, unspecified: Secondary | ICD-10-CM | POA: Diagnosis not present

## 2013-10-17 DIAGNOSIS — Z992 Dependence on renal dialysis: Secondary | ICD-10-CM | POA: Diagnosis not present

## 2013-11-14 DIAGNOSIS — N186 End stage renal disease: Secondary | ICD-10-CM | POA: Diagnosis not present

## 2013-11-14 DIAGNOSIS — D631 Anemia in chronic kidney disease: Secondary | ICD-10-CM | POA: Diagnosis not present

## 2013-11-14 DIAGNOSIS — N2581 Secondary hyperparathyroidism of renal origin: Secondary | ICD-10-CM | POA: Diagnosis not present

## 2013-11-14 DIAGNOSIS — Z992 Dependence on renal dialysis: Secondary | ICD-10-CM | POA: Diagnosis not present

## 2013-11-14 DIAGNOSIS — D509 Iron deficiency anemia, unspecified: Secondary | ICD-10-CM | POA: Diagnosis not present

## 2013-12-12 ENCOUNTER — Encounter (HOSPITAL_COMMUNITY): Payer: Self-pay | Admitting: Emergency Medicine

## 2013-12-12 ENCOUNTER — Emergency Department (INDEPENDENT_AMBULATORY_CARE_PROVIDER_SITE_OTHER)
Admission: EM | Admit: 2013-12-12 | Discharge: 2013-12-12 | Disposition: A | Discharge status: Home / Self Care | Payer: Medicare Other | Source: Home / Self Care | Attending: Emergency Medicine | Admitting: Emergency Medicine

## 2013-12-12 DIAGNOSIS — J111 Influenza due to unidentified influenza virus with other respiratory manifestations: Secondary | ICD-10-CM

## 2013-12-12 MED ORDER — TRAMADOL HCL 50 MG PO TABS: 100.0000 mg | ORAL_TABLET | Freq: Three times a day (TID) | ORAL | Status: DC | PRN

## 2013-12-12 MED ORDER — OSELTAMIVIR PHOSPHATE 75 MG PO CAPS: 75.0000 mg | ORAL_CAPSULE | Freq: Two times a day (BID) | ORAL | Status: DC

## 2013-12-12 NOTE — ED Notes (Signed)
Pt is currently out of BP meds and has been with out meds for several days.   Need refill.

## 2013-12-12 NOTE — ED Notes (Signed)
C/o  Body aches.  Nonproductive cough.  Headache.  Fever.   Denies n/v/d.  Onset  Yesterday.   Pt has tried robutusin with mild relief.

## 2013-12-12 NOTE — ED Provider Notes (Signed)
  Chief Complaint   Chief Complaint  Patient presents with  . Influenza    History of Present Illness   [name] [name] is a 47 year old male who's had a two-day history of dry cough, fever 100.8, chills, sweats, myalgias, headache, scratchy throat, and sore throat. He's been exposed to the flu and has gotten the vaccine. He denies any nasal congestion, rhinorrhea, wheezing, shortness of breath, chest pain, or GI symptoms.  Review of Systems   Other than as noted above, the patient denies any of the following symptoms: Systemic:  No fevers, chills, sweats, or myalgias. Eye:  No redness or discharge. ENT:  No ear pain, headache, nasal congestion, drainage, sinus pressure, or sore throat. Neck:  No neck pain, stiffness, or swollen glands. Lungs:  No cough, sputum production, hemoptysis, wheezing, chest tightness, shortness of breath or chest pain. GI:  No abdominal pain, nausea, vomiting or diarrhea.  [place]   Past medical history, family history, social history, meds, and allergies were reviewed. He has high blood pressure and is on kidney dialysis. He takes lisinopril, carvedilol, and isosorbide. He denies any cardiac disease. He has a history of congestive heart failure, medical noncompliance, and polysubstance abuse.  Physical exam   Vital signs:  BP 158/105  Pulse 88  Temp(Src) 100.8 F (38.2 C) (Oral)  Resp 18  Ht 5' 9.5" (1.765 m)  Wt 160 lb (72.576 kg)  BMI 23.30 kg/m2  SpO2 100% General:  Alert and oriented.  In no distress.  Skin warm and dry. Eye:  No conjunctival injection or drainage. Lids were normal. ENT:  TMs and canals were normal, without erythema or inflammation.  Nasal mucosa was clear and uncongested, without drainage.  Mucous membranes were moist.  Pharynx was clear with no exudate or drainage.  There were no oral ulcerations or lesions. Neck:  Supple, no adenopathy, tenderness or mass. Lungs:  No respiratory distress.  Lungs were clear to auscultation,  without wheezes, rales or rhonchi.  Breath sounds were clear and equal bilaterally.  Heart:  Regular rhythm, without gallops, murmers or rubs. Skin:  Clear, warm, and dry, without rash or lesions.  Assessment     The encounter diagnosis was Influenza-like illness.  Plan    1.  Meds:  The following meds were prescribed:   Discharge Medication List as of 12/12/2013 10:16 AM    START taking these medications   Details  oseltamivir (TAMIFLU) 75 MG capsule Take 1 capsule (75 mg total) by mouth every 12 (twelve) hours., Starting 12/12/2013, Until Discontinued, Normal    traMADol (ULTRAM) 50 MG tablet Take 2 tablets (100 mg total) by mouth every 8 (eight) hours as needed., Starting 12/12/2013, Until Discontinued, Normal        2.  Patient Education/Counseling:  The patient was given appropriate handouts, self care instructions, and instructed in symptomatic relief.  Instructed to get extra fluids, rest, and use a cool mist vaporizer.    3.  Follow up:  The patient was told to follow up here if no better in 3 to 4 days, or sooner if becoming worse in any way, and given some red flag symptoms such as increasing fever, difficulty breathing, chest pain, or persistent vomiting which would prompt immediate return.  Follow up here as needed.      Harden Mo, MD 12/12/13 [phone]

## 2013-12-16 DIAGNOSIS — N2581 Secondary hyperparathyroidism of renal origin: Secondary | ICD-10-CM | POA: Diagnosis not present

## 2013-12-16 DIAGNOSIS — N186 End stage renal disease: Secondary | ICD-10-CM | POA: Diagnosis not present

## 2013-12-16 DIAGNOSIS — D631 Anemia in chronic kidney disease: Secondary | ICD-10-CM | POA: Diagnosis not present

## 2013-12-16 DIAGNOSIS — D509 Iron deficiency anemia, unspecified: Secondary | ICD-10-CM | POA: Diagnosis not present

## 2013-12-16 DIAGNOSIS — Z992 Dependence on renal dialysis: Secondary | ICD-10-CM | POA: Diagnosis not present

## 2014-01-14 DIAGNOSIS — N186 End stage renal disease: Secondary | ICD-10-CM | POA: Diagnosis not present

## 2014-01-17 DIAGNOSIS — D509 Iron deficiency anemia, unspecified: Secondary | ICD-10-CM | POA: Diagnosis not present

## 2014-01-17 DIAGNOSIS — N186 End stage renal disease: Secondary | ICD-10-CM | POA: Diagnosis not present

## 2014-01-17 DIAGNOSIS — Z992 Dependence on renal dialysis: Secondary | ICD-10-CM | POA: Diagnosis not present

## 2014-01-17 DIAGNOSIS — N2581 Secondary hyperparathyroidism of renal origin: Secondary | ICD-10-CM | POA: Diagnosis not present

## 2014-01-17 DIAGNOSIS — N039 Chronic nephritic syndrome with unspecified morphologic changes: Secondary | ICD-10-CM | POA: Diagnosis not present

## 2014-01-18 DIAGNOSIS — D631 Anemia in chronic kidney disease: Secondary | ICD-10-CM | POA: Diagnosis not present

## 2014-01-18 DIAGNOSIS — N2581 Secondary hyperparathyroidism of renal origin: Secondary | ICD-10-CM | POA: Diagnosis not present

## 2014-01-18 DIAGNOSIS — D509 Iron deficiency anemia, unspecified: Secondary | ICD-10-CM | POA: Diagnosis not present

## 2014-01-18 DIAGNOSIS — N186 End stage renal disease: Secondary | ICD-10-CM | POA: Diagnosis not present

## 2014-01-18 DIAGNOSIS — Z992 Dependence on renal dialysis: Secondary | ICD-10-CM | POA: Diagnosis not present

## 2014-01-23 DIAGNOSIS — Z992 Dependence on renal dialysis: Secondary | ICD-10-CM | POA: Diagnosis not present

## 2014-01-23 DIAGNOSIS — N186 End stage renal disease: Secondary | ICD-10-CM | POA: Diagnosis not present

## 2014-01-23 DIAGNOSIS — D631 Anemia in chronic kidney disease: Secondary | ICD-10-CM | POA: Diagnosis not present

## 2014-01-23 DIAGNOSIS — N2581 Secondary hyperparathyroidism of renal origin: Secondary | ICD-10-CM | POA: Diagnosis not present

## 2014-01-23 DIAGNOSIS — D509 Iron deficiency anemia, unspecified: Secondary | ICD-10-CM | POA: Diagnosis not present

## 2014-01-25 DIAGNOSIS — D509 Iron deficiency anemia, unspecified: Secondary | ICD-10-CM | POA: Diagnosis not present

## 2014-01-25 DIAGNOSIS — Z992 Dependence on renal dialysis: Secondary | ICD-10-CM | POA: Diagnosis not present

## 2014-01-25 DIAGNOSIS — N2581 Secondary hyperparathyroidism of renal origin: Secondary | ICD-10-CM | POA: Diagnosis not present

## 2014-01-25 DIAGNOSIS — N186 End stage renal disease: Secondary | ICD-10-CM | POA: Diagnosis not present

## 2014-01-25 DIAGNOSIS — D631 Anemia in chronic kidney disease: Secondary | ICD-10-CM | POA: Diagnosis not present

## 2014-01-27 DIAGNOSIS — D631 Anemia in chronic kidney disease: Secondary | ICD-10-CM | POA: Diagnosis not present

## 2014-01-27 DIAGNOSIS — N2581 Secondary hyperparathyroidism of renal origin: Secondary | ICD-10-CM | POA: Diagnosis not present

## 2014-01-27 DIAGNOSIS — Z992 Dependence on renal dialysis: Secondary | ICD-10-CM | POA: Diagnosis not present

## 2014-01-27 DIAGNOSIS — N186 End stage renal disease: Secondary | ICD-10-CM | POA: Diagnosis not present

## 2014-01-27 DIAGNOSIS — D509 Iron deficiency anemia, unspecified: Secondary | ICD-10-CM | POA: Diagnosis not present

## 2014-01-30 DIAGNOSIS — D509 Iron deficiency anemia, unspecified: Secondary | ICD-10-CM | POA: Diagnosis not present

## 2014-01-30 DIAGNOSIS — N2581 Secondary hyperparathyroidism of renal origin: Secondary | ICD-10-CM | POA: Diagnosis not present

## 2014-01-30 DIAGNOSIS — D631 Anemia in chronic kidney disease: Secondary | ICD-10-CM | POA: Diagnosis not present

## 2014-01-30 DIAGNOSIS — Z992 Dependence on renal dialysis: Secondary | ICD-10-CM | POA: Diagnosis not present

## 2014-01-30 DIAGNOSIS — N186 End stage renal disease: Secondary | ICD-10-CM | POA: Diagnosis not present

## 2014-02-01 DIAGNOSIS — N186 End stage renal disease: Secondary | ICD-10-CM | POA: Diagnosis not present

## 2014-02-01 DIAGNOSIS — N2581 Secondary hyperparathyroidism of renal origin: Secondary | ICD-10-CM | POA: Diagnosis not present

## 2014-02-01 DIAGNOSIS — D509 Iron deficiency anemia, unspecified: Secondary | ICD-10-CM | POA: Diagnosis not present

## 2014-02-01 DIAGNOSIS — D631 Anemia in chronic kidney disease: Secondary | ICD-10-CM | POA: Diagnosis not present

## 2014-02-01 DIAGNOSIS — Z992 Dependence on renal dialysis: Secondary | ICD-10-CM | POA: Diagnosis not present

## 2014-02-06 DIAGNOSIS — Z992 Dependence on renal dialysis: Secondary | ICD-10-CM | POA: Diagnosis not present

## 2014-02-06 DIAGNOSIS — N2581 Secondary hyperparathyroidism of renal origin: Secondary | ICD-10-CM | POA: Diagnosis not present

## 2014-02-06 DIAGNOSIS — N186 End stage renal disease: Secondary | ICD-10-CM | POA: Diagnosis not present

## 2014-02-06 DIAGNOSIS — N039 Chronic nephritic syndrome with unspecified morphologic changes: Secondary | ICD-10-CM | POA: Diagnosis not present

## 2014-02-06 DIAGNOSIS — D509 Iron deficiency anemia, unspecified: Secondary | ICD-10-CM | POA: Diagnosis not present

## 2014-02-08 DIAGNOSIS — Z992 Dependence on renal dialysis: Secondary | ICD-10-CM | POA: Diagnosis not present

## 2014-02-08 DIAGNOSIS — D509 Iron deficiency anemia, unspecified: Secondary | ICD-10-CM | POA: Diagnosis not present

## 2014-02-08 DIAGNOSIS — D631 Anemia in chronic kidney disease: Secondary | ICD-10-CM | POA: Diagnosis not present

## 2014-02-08 DIAGNOSIS — N186 End stage renal disease: Secondary | ICD-10-CM | POA: Diagnosis not present

## 2014-02-08 DIAGNOSIS — N2581 Secondary hyperparathyroidism of renal origin: Secondary | ICD-10-CM | POA: Diagnosis not present

## 2014-02-11 DIAGNOSIS — N186 End stage renal disease: Secondary | ICD-10-CM | POA: Diagnosis not present

## 2014-02-13 DIAGNOSIS — D631 Anemia in chronic kidney disease: Secondary | ICD-10-CM | POA: Diagnosis not present

## 2014-02-13 DIAGNOSIS — D509 Iron deficiency anemia, unspecified: Secondary | ICD-10-CM | POA: Diagnosis not present

## 2014-02-13 DIAGNOSIS — N2581 Secondary hyperparathyroidism of renal origin: Secondary | ICD-10-CM | POA: Diagnosis not present

## 2014-02-13 DIAGNOSIS — N186 End stage renal disease: Secondary | ICD-10-CM | POA: Diagnosis not present

## 2014-02-13 DIAGNOSIS — Z992 Dependence on renal dialysis: Secondary | ICD-10-CM | POA: Diagnosis not present

## 2014-03-08 ENCOUNTER — Emergency Department (HOSPITAL_COMMUNITY): Payer: Medicare Other

## 2014-03-08 ENCOUNTER — Encounter (HOSPITAL_COMMUNITY): Payer: Self-pay | Admitting: Emergency Medicine

## 2014-03-08 ENCOUNTER — Inpatient Hospital Stay (HOSPITAL_COMMUNITY): Payer: Medicare Other

## 2014-03-08 ENCOUNTER — Inpatient Hospital Stay (HOSPITAL_COMMUNITY)
Admission: EM | Admit: 2014-03-08 | Discharge: 2014-03-09 | DRG: 640 | Disposition: A | Discharge status: Home / Self Care | Payer: Medicare Other | Attending: Family Medicine | Admitting: Family Medicine

## 2014-03-08 DIAGNOSIS — Z9119 Patient's noncompliance with other medical treatment and regimen: Secondary | ICD-10-CM

## 2014-03-08 DIAGNOSIS — I119 Hypertensive heart disease without heart failure: Secondary | ICD-10-CM

## 2014-03-08 DIAGNOSIS — I5020 Unspecified systolic (congestive) heart failure: Secondary | ICD-10-CM | POA: Diagnosis present

## 2014-03-08 DIAGNOSIS — Z79899 Other long term (current) drug therapy: Secondary | ICD-10-CM

## 2014-03-08 DIAGNOSIS — R112 Nausea with vomiting, unspecified: Secondary | ICD-10-CM

## 2014-03-08 DIAGNOSIS — R040 Epistaxis: Secondary | ICD-10-CM

## 2014-03-08 DIAGNOSIS — I509 Heart failure, unspecified: Secondary | ICD-10-CM | POA: Diagnosis present

## 2014-03-08 DIAGNOSIS — I132 Hypertensive heart and chronic kidney disease with heart failure and with stage 5 chronic kidney disease, or end stage renal disease: Secondary | ICD-10-CM | POA: Diagnosis present

## 2014-03-08 DIAGNOSIS — N186 End stage renal disease: Secondary | ICD-10-CM | POA: Diagnosis not present

## 2014-03-08 DIAGNOSIS — I10 Essential (primary) hypertension: Secondary | ICD-10-CM

## 2014-03-08 DIAGNOSIS — Z992 Dependence on renal dialysis: Secondary | ICD-10-CM

## 2014-03-08 DIAGNOSIS — F17210 Nicotine dependence, cigarettes, uncomplicated: Secondary | ICD-10-CM | POA: Diagnosis present

## 2014-03-08 DIAGNOSIS — F17200 Nicotine dependence, unspecified, uncomplicated: Secondary | ICD-10-CM

## 2014-03-08 DIAGNOSIS — F411 Generalized anxiety disorder: Secondary | ICD-10-CM | POA: Diagnosis present

## 2014-03-08 DIAGNOSIS — D62 Acute posthemorrhagic anemia: Secondary | ICD-10-CM

## 2014-03-08 DIAGNOSIS — R109 Unspecified abdominal pain: Secondary | ICD-10-CM

## 2014-03-08 DIAGNOSIS — N189 Chronic kidney disease, unspecified: Secondary | ICD-10-CM

## 2014-03-08 DIAGNOSIS — E872 Acidosis, unspecified: Secondary | ICD-10-CM

## 2014-03-08 DIAGNOSIS — D649 Anemia, unspecified: Secondary | ICD-10-CM | POA: Diagnosis present

## 2014-03-08 DIAGNOSIS — Z91199 Patient's noncompliance with other medical treatment and regimen due to unspecified reason: Secondary | ICD-10-CM

## 2014-03-08 DIAGNOSIS — M949 Disorder of cartilage, unspecified: Secondary | ICD-10-CM

## 2014-03-08 DIAGNOSIS — N2889 Other specified disorders of kidney and ureter: Secondary | ICD-10-CM

## 2014-03-08 DIAGNOSIS — M899 Disorder of bone, unspecified: Secondary | ICD-10-CM | POA: Diagnosis present

## 2014-03-08 DIAGNOSIS — I43 Cardiomyopathy in diseases classified elsewhere: Secondary | ICD-10-CM | POA: Diagnosis not present

## 2014-03-08 DIAGNOSIS — E875 Hyperkalemia: Principal | ICD-10-CM

## 2014-03-08 DIAGNOSIS — R7989 Other specified abnormal findings of blood chemistry: Secondary | ICD-10-CM

## 2014-03-08 DIAGNOSIS — N289 Disorder of kidney and ureter, unspecified: Secondary | ICD-10-CM

## 2014-03-08 DIAGNOSIS — N179 Acute kidney failure, unspecified: Secondary | ICD-10-CM

## 2014-03-08 DIAGNOSIS — N19 Unspecified kidney failure: Secondary | ICD-10-CM

## 2014-03-08 DIAGNOSIS — N2581 Secondary hyperparathyroidism of renal origin: Secondary | ICD-10-CM | POA: Diagnosis present

## 2014-03-08 DIAGNOSIS — Z87891 Personal history of nicotine dependence: Secondary | ICD-10-CM

## 2014-03-08 DIAGNOSIS — R778 Other specified abnormalities of plasma proteins: Secondary | ICD-10-CM

## 2014-03-08 DIAGNOSIS — Z23 Encounter for immunization: Secondary | ICD-10-CM

## 2014-03-08 DIAGNOSIS — F1410 Cocaine abuse, uncomplicated: Secondary | ICD-10-CM

## 2014-03-08 DIAGNOSIS — Z9115 Patient's noncompliance with renal dialysis: Secondary | ICD-10-CM

## 2014-03-08 DIAGNOSIS — E785 Hyperlipidemia, unspecified: Secondary | ICD-10-CM | POA: Diagnosis present

## 2014-03-08 DIAGNOSIS — IMO0001 Reserved for inherently not codable concepts without codable children: Secondary | ICD-10-CM | POA: Diagnosis present

## 2014-03-08 DIAGNOSIS — G2581 Restless legs syndrome: Secondary | ICD-10-CM | POA: Diagnosis present

## 2014-03-08 DIAGNOSIS — F1010 Alcohol abuse, uncomplicated: Secondary | ICD-10-CM

## 2014-03-08 DIAGNOSIS — Z91158 Patient's noncompliance with renal dialysis for other reason: Secondary | ICD-10-CM

## 2014-03-08 DIAGNOSIS — I120 Hypertensive chronic kidney disease with stage 5 chronic kidney disease or end stage renal disease: Secondary | ICD-10-CM | POA: Diagnosis not present

## 2014-03-08 LAB — POCT I-STAT, CHEM 8
BUN: 22 mg/dL (ref 6–23)
CALCIUM ION: 1.17 mmol/L (ref 1.12–1.23)
Chloride: 96 mEq/L (ref 96–112)
Creatinine, Ser: 6.4 mg/dL — ABNORMAL HIGH (ref 0.50–1.35)
GLUCOSE: 108 mg/dL — AB (ref 70–99)
HCT: 48 % (ref 39.0–52.0)
Hemoglobin: 16.3 g/dL (ref 13.0–17.0)
Potassium: 3.7 mEq/L (ref 3.7–5.3)
SODIUM: 138 meq/L (ref 137–147)
TCO2: 28 mmol/L (ref 0–100)

## 2014-03-08 LAB — COMPREHENSIVE METABOLIC PANEL
ALK PHOS: 63 U/L (ref 39–117)
ALT: 11 U/L (ref 0–53)
AST: 22 U/L (ref 0–37)
Albumin: 4.8 g/dL (ref 3.5–5.2)
BUN: 37 mg/dL — AB (ref 6–23)
CALCIUM: 11.6 mg/dL — AB (ref 8.4–10.5)
CO2: 27 mEq/L (ref 19–32)
Chloride: 88 mEq/L — ABNORMAL LOW (ref 96–112)
Creatinine, Ser: 9.82 mg/dL — ABNORMAL HIGH (ref 0.50–1.35)
GFR, EST AFRICAN AMERICAN: 7 mL/min — AB (ref >90)
GFR, EST NON AFRICAN AMERICAN: 6 mL/min — AB (ref >90)
GLUCOSE: 88 mg/dL (ref 70–99)
POTASSIUM: 6.1 meq/L — AB (ref 3.7–5.3)
Sodium: 136 mEq/L — ABNORMAL LOW (ref 137–147)
Total Bilirubin: 0.7 mg/dL (ref 0.3–1.2)
Total Protein: 10 g/dL — ABNORMAL HIGH (ref 6.0–8.3)

## 2014-03-08 LAB — BASIC METABOLIC PANEL
BUN: 39 mg/dL — ABNORMAL HIGH (ref 6–23)
CO2: 27 mEq/L (ref 19–32)
Calcium: 10.5 mg/dL (ref 8.4–10.5)
Chloride: 91 mEq/L — ABNORMAL LOW (ref 96–112)
Creatinine, Ser: 10.11 mg/dL — ABNORMAL HIGH (ref 0.50–1.35)
GFR calc Af Amer: 6 mL/min — ABNORMAL LOW (ref >90)
GFR calc non Af Amer: 5 mL/min — ABNORMAL LOW (ref >90)
Glucose, Bld: 92 mg/dL (ref 70–99)
Potassium: 7.1 mEq/L (ref 3.7–5.3)
Sodium: 135 mEq/L — ABNORMAL LOW (ref 137–147)

## 2014-03-08 LAB — LIPASE, BLOOD: Lipase: 26 U/L (ref 11–59)

## 2014-03-08 LAB — CBC
HCT: 45.4 % (ref 39.0–52.0)
HEMOGLOBIN: 15.4 g/dL (ref 13.0–17.0)
MCH: 31.8 pg (ref 26.0–34.0)
MCHC: 33.9 g/dL (ref 30.0–36.0)
MCV: 93.6 fL (ref 78.0–100.0)
Platelets: 224 10*3/uL (ref 150–400)
RBC: 4.85 MIL/uL (ref 4.22–5.81)
RDW: 14.7 % (ref 11.5–15.5)
WBC: 10.5 10*3/uL (ref 4.0–10.5)

## 2014-03-08 MED ORDER — ONDANSETRON HCL 4 MG/2ML IJ SOLN
4.0000 mg | Freq: Once | INTRAMUSCULAR | Status: AC
  Administered 2014-03-08: 4 mg via INTRAVENOUS
  Filled 2014-03-08: qty 2

## 2014-03-08 MED ORDER — ACETAMINOPHEN 650 MG RE SUPP: 650.0000 mg | Freq: Four times a day (QID) | RECTAL | Status: DC | PRN

## 2014-03-08 MED ORDER — HYDROMORPHONE HCL PF 1 MG/ML IJ SOLN
1.0000 mg | Freq: Once | INTRAMUSCULAR | Status: AC
  Administered 2014-03-08: 1 mg via INTRAVENOUS
  Filled 2014-03-08: qty 1

## 2014-03-08 MED ORDER — HEPARIN SODIUM (PORCINE) 5000 UNIT/ML IJ SOLN
5000.0000 [IU] | Freq: Three times a day (TID) | INTRAMUSCULAR | Status: DC
  Administered 2014-03-08 – 2014-03-09 (×2): 5000 [IU] via SUBCUTANEOUS
  Filled 2014-03-08 (×5): qty 1

## 2014-03-08 MED ORDER — ONDANSETRON HCL 4 MG/2ML IJ SOLN: 4.0000 mg | Freq: Four times a day (QID) | INTRAMUSCULAR | Status: DC | PRN

## 2014-03-08 MED ORDER — ONDANSETRON HCL 4 MG PO TABS: 4.0000 mg | ORAL_TABLET | Freq: Four times a day (QID) | ORAL | Status: DC | PRN

## 2014-03-08 MED ORDER — ONDANSETRON HCL 4 MG/2ML IJ SOLN: 4.0000 mg | Freq: Three times a day (TID) | INTRAMUSCULAR | Status: DC | PRN

## 2014-03-08 MED ORDER — SODIUM CHLORIDE 0.9 % IV SOLN
1.0000 g | Freq: Once | INTRAVENOUS | Status: AC
  Administered 2014-03-08: 1 g via INTRAVENOUS
  Filled 2014-03-08: qty 10

## 2014-03-08 MED ORDER — INSULIN ASPART 100 UNIT/ML IV SOLN
10.0000 [IU] | Freq: Once | INTRAVENOUS | Status: AC
  Administered 2014-03-08: 10 [IU] via INTRAVENOUS
  Filled 2014-03-08 (×2): qty 0.1

## 2014-03-08 MED ORDER — HYDRALAZINE HCL 20 MG/ML IJ SOLN: 10.0000 mg | Freq: Four times a day (QID) | INTRAMUSCULAR | Status: DC | PRN

## 2014-03-08 MED ORDER — SODIUM CHLORIDE 0.9 % IJ SOLN
3.0000 mL | Freq: Two times a day (BID) | INTRAMUSCULAR | Status: DC
  Administered 2014-03-08 – 2014-03-09 (×2): 3 mL via INTRAVENOUS

## 2014-03-08 MED ORDER — ACETAMINOPHEN 325 MG PO TABS: 650.0000 mg | ORAL_TABLET | Freq: Four times a day (QID) | ORAL | Status: DC | PRN

## 2014-03-08 MED ORDER — HYDROMORPHONE HCL PF 1 MG/ML IJ SOLN
1.0000 mg | INTRAMUSCULAR | Status: AC | PRN
  Administered 2014-03-08 – 2014-03-09 (×3): 1 mg via INTRAVENOUS
  Filled 2014-03-08 (×3): qty 1

## 2014-03-08 MED ORDER — DEXTROSE 50 % IV SOLN
1.0000 | Freq: Once | INTRAVENOUS | Status: AC
  Administered 2014-03-08: 50 mL via INTRAVENOUS
  Filled 2014-03-08: qty 50

## 2014-03-08 MED ORDER — CARVEDILOL 25 MG PO TABS
25.0000 mg | ORAL_TABLET | Freq: Two times a day (BID) | ORAL | Status: DC
  Administered 2014-03-08 – 2014-03-09 (×2): 25 mg via ORAL
  Filled 2014-03-08 (×4): qty 1

## 2014-03-08 MED ORDER — LISINOPRIL 10 MG PO TABS
10.0000 mg | ORAL_TABLET | Freq: Every day | ORAL | Status: DC
  Administered 2014-03-08: 10 mg via ORAL
  Filled 2014-03-08: qty 1

## 2014-03-08 MED ORDER — AMLODIPINE BESYLATE 5 MG PO TABS
5.0000 mg | ORAL_TABLET | Freq: Every day | ORAL | Status: DC
  Administered 2014-03-09: 5 mg via ORAL
  Filled 2014-03-08 (×2): qty 1

## 2014-03-08 MED ORDER — ADULT MULTIVITAMIN W/MINERALS CH
1.0000 | ORAL_TABLET | Freq: Every day | ORAL | Status: DC
  Administered 2014-03-08 – 2014-03-09 (×2): 1 via ORAL
  Filled 2014-03-08 (×2): qty 1

## 2014-03-08 MED ORDER — ALBUTEROL SULFATE (2.5 MG/3ML) 0.083% IN NEBU
2.5000 mg | INHALATION_SOLUTION | Freq: Once | RESPIRATORY_TRACT | Status: AC
  Administered 2014-03-08: 2.5 mg via RESPIRATORY_TRACT
  Filled 2014-03-08: qty 3

## 2014-03-08 MED ORDER — ISOSORBIDE MONONITRATE ER 60 MG PO TB24
60.0000 mg | ORAL_TABLET | Freq: Every day | ORAL | Status: DC
  Administered 2014-03-08 – 2014-03-09 (×2): 60 mg via ORAL
  Filled 2014-03-08 (×2): qty 1

## 2014-03-08 NOTE — ED Notes (Addendum)
Dialysis M, W, Fri. Pt went to Dialysis today Has not taken any BP med this morning.

## 2014-03-08 NOTE — ED Provider Notes (Signed)
Medical screening examination/treatment/procedure(s) were conducted as a shared visit with non-physician practitioner(s) and myself.  I personally evaluated the patient during the encounter.   EKG Interpretation None      Results for orders placed during the hospital encounter of 03/08/14  CBC      Result Value Ref Range   WBC 10.5  4.0 - 10.5 K/uL   RBC 4.85  4.22 - 5.81 MIL/uL   Hemoglobin 15.4  13.0 - 17.0 g/dL   HCT 45.4  39.0 - 52.0 %   MCV 93.6  78.0 - 100.0 fL   MCH 31.8  26.0 - 34.0 pg   MCHC 33.9  30.0 - 36.0 g/dL   RDW 14.7  11.5 - 15.5 %   Platelets 224  150 - 400 K/uL  COMPREHENSIVE METABOLIC PANEL      Result Value Ref Range   Sodium 136 (*) 137 - 147 mEq/L   Potassium 6.1 (*) 3.7 - 5.3 mEq/L   Chloride 88 (*) 96 - 112 mEq/L   CO2 27  19 - 32 mEq/L   Glucose, Bld 88  70 - 99 mg/dL   BUN 37 (*) 6 - 23 mg/dL   Creatinine, Ser 9.82 (*) 0.50 - 1.35 mg/dL   Calcium 11.6 (*) 8.4 - 10.5 mg/dL   Total Protein 10.0 (*) 6.0 - 8.3 g/dL   Albumin 4.8  3.5 - 5.2 g/dL   AST 22  0 - 37 U/L   ALT 11  0 - 53 U/L   Alkaline Phosphatase 63  39 - 117 U/L   Total Bilirubin 0.7  0.3 - 1.2 mg/dL   GFR calc non Af Amer 6 (*) >90 mL/min   GFR calc Af Amer 7 (*) >90 mL/min  LIPASE, BLOOD      Result Value Ref Range   Lipase 26  11 - 59 U/L  BASIC METABOLIC PANEL      Result Value Ref Range   Sodium 135 (*) 137 - 147 mEq/L   Potassium 7.1 (*) 3.7 - 5.3 mEq/L   Chloride 91 (*) 96 - 112 mEq/L   CO2 27  19 - 32 mEq/L   Glucose, Bld 92  70 - 99 mg/dL   BUN 39 (*) 6 - 23 mg/dL   Creatinine, Ser 10.11 (*) 0.50 - 1.35 mg/dL   Calcium 10.5  8.4 - 10.5 mg/dL   GFR calc non Af Amer 5 (*) >90 mL/min   GFR calc Af Amer 6 (*) >90 mL/min   Ct Abdomen Pelvis Wo Contrast  03/08/2014   CLINICAL DATA:  Left flank and groin pain  EXAM: CT ABDOMEN AND PELVIS WITHOUT CONTRAST  TECHNIQUE: Multidetector CT imaging of the abdomen and pelvis was performed following the standard protocol without  intravenous contrast.  COMPARISON:  DG ABD 2 VIEWS dated 07/21/2012; US RENAL dated 07/15/2012  FINDINGS: Cardiomegaly noted. Lung bases are clear. Kidneys are small in size with lobulated contour and ill-defined hypodense cortical areas suggestive of cysts but incompletely characterized, for example 1.2 cm right upper renal pole in 1.2 cm left upper renal pole image 30. There is a more masslike appearance to the left lower renal pole with an apparent solid mass measuring 2.9 x 2.9 cm image 37. No hydroureteronephrosis. No radiopaque renal or ureteral calculus. Unenhanced liver, gallbladder, spleen, pancreas, and adrenal glands appear normal.  Appendix is normal. Bladder is normal. No bowel wall thickening or focal segmental dilatation. Moderate atheromatous aortic calcification without aneurysm. No lymphadenopathy or free  fluid.  No acute osseous abnormality.  L5-S1 disc degenerative change noted.  IMPRESSION: No acute intra-abdominal or pelvic pathology.  2.9 cm left lower renal pole mass which is suspicious for renal cell carcinoma or less likely benign neoplasm such as oncocytoma, or less likely hyperdense cyst. Other areas of renal cortical lobulation and hypodensity are incompletely characterized on this noncontrast exam. If the patient cannot receive contrast, then outpatient nonemergent renal ultrasound would be recommended as the next imaging exam for further evaluation. If the patient can receive IV contrast, then CT abdomen with contrast according to renal mass protocol would be recommended for further characterization on a nonemergent basis as an outpatient. Because the patient reportedly has stage IV chronic kidney disease, he is unlikely to be a candidate for contrast enhanced MRI.  These results will be called to the ordering clinician or representative by the Radiologist Assistant, and communication documented in the PACS Dashboard.   Electronically Signed   By: Conchita Paris M.D.   On: 03/08/2014  13:24    Critical care documented by PA. Patient is a dialysis patient. Was dialyzed today. Patient with flank pain came in for evaluation of that. It is on the left side of his abdomen. During the workup patient's potassium came back at 6.1. EKG suggestive of hyperkalemia as well with some widening of the QRS peaked T waves. Not sure why this would be present right after dialysis. Nephrology consult today evaluated and felt it was probably a regimen. Repeat potassium was done came back actually at 7. Patient will require bowel cyst. In the meantime patient was given calcium gluconate, patient also got D50 and insulin.   Mervin Kung, MD 03/08/14 [phone]

## 2014-03-08 NOTE — ED Provider Notes (Signed)
CSN: [id number]     Arrival date & time 03/08/14  1007 History   First MD Initiated Contact with Patient 03/08/14 1141     Chief Complaint  Patient presents with  . 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 Pain has been constant since, described as cramping, radiating along the left side of his abdomen. He went to dialysis today and completed treatment, however could not take the pain and came to the ED. Tried taking tylenol without relief. No specific aggravating or alleviating factors. He makes urine, denies any urinary symptoms. Denies fever, chills, n/v/d. No hx of kidney stones. No hx of abdominal surgeries. BP noted to be elevated, pt did not take his medications this morning.  Patient is a 48 y.o. male presenting with flank pain. The history is provided by the patient.  Flank Pain Associated symptoms include abdominal pain.    Past Medical History  Diagnosis Date  . Hypertensive urgency   . Hypertension   . CKD (chronic kidney disease) stage 4, GFR 15-29 ml/min   . Systolic CHF     Ef [id number]  . Medical non-compliance   . Polysubstance abuse   . ESRD (end stage renal disease)    Past Surgical History  Procedure Laterality Date  . Insertion of dialysis catheter  07/17/2012    Procedure: INSERTION OF DIALYSIS CATHETER;  Surgeon: Conrad Delta, MD;  Location: [place];  Service: Vascular;  Laterality: Right;  right internal jugular  . Bascilic vein transposition  01/19/2013    Procedure: BASCILIC VEIN TRANSPOSITION;  Surgeon: Conrad Bristow, MD;  Location: [place];  Service: Vascular;  Laterality: Left;  left 2nd stage basilic vein transposition   Family  History  Problem Relation Age of Onset  . Hypertension Mother   . Hypertension Father    History  Substance Use Topics  . Smoking status: Former Smoker    Types: Cigarettes    Quit date: 08/09/2012  . Smokeless tobacco: Not on file     Comment: Trying to quit; 3 cigs/last 2 weeks  . Alcohol Use: 4.2 oz/week    7 Cans of beer per week     Comment: Beer and liquor. Unknown qty. Patient not forthcoming    Review of Systems  Gastrointestinal: Positive for abdominal pain.  Genitourinary: Positive for flank pain.  All other systems reviewed and are negative.      Allergies  Review of patient's allergies indicates no known allergies.  Home Medications   Current Outpatient Rx  Name  Route  Sig  Dispense  Refill  . carvedilol (COREG) 12.5 MG tablet   Oral   Take 25 mg by mouth 2 (two) times daily with a meal.         . isosorbide mononitrate (IMDUR) 60 MG 24 hr tablet   Oral   Take 60 mg by mouth daily.         Marland Kitchen lisinopril (PRINIVIL,ZESTRIL) 10 MG tablet   Oral   Take 10 mg by mouth daily.         . Multiple Vitamin (MULTIVITAMIN WITH MINERALS) TABS tablet   Oral   Take 1 tablet by mouth daily.         Marland Kitchen  oxyCODONE (ROXICODONE) 5 MG immediate release tablet   Oral   Take 1-2 tablets (5-10 mg total) by mouth every 4 (four) hours as needed for pain.   30 tablet   0   . traMADol (ULTRAM) 50 MG tablet   Oral   Take 100 mg by mouth every 6 (six) hours as needed (pain).          BP 192/119  Pulse 67  Temp(Src) 98.4 F (36.9 C) (Oral)  Resp 22  Ht 5\' 9"  (1.753 m)  Wt 155 lb 3.3 oz (70.4 kg)  BMI 22.91 kg/m2  SpO2 100% Physical Exam  Nursing note and vitals reviewed. Constitutional: He appears distressed.  Abdominal: Normal appearance. He exhibits no distension. There is generalized tenderness. There is guarding and CVA tenderness (mild left). There is no rigidity and no rebound.  Generalized abdominal tenderness, worse left side and flank. No  peritoneal signs.    ED Course  Procedures (including critical care time) CRITICAL CARE Performed by: Michele Mcalpine   Total critical care time: 30 minutes  Critical care time was exclusive of separately billable procedures and treating other patients.  Critical care was necessary to treat or prevent imminent or life-threatening deterioration.  Critical care was time spent personally by me on the following activities: development of treatment plan with patient and/or surrogate as well as nursing, discussions with consultants, evaluation of patient's response to treatment, examination of patient, obtaining history from patient or surrogate, ordering and performing treatments and interventions, ordering and review of laboratory studies, ordering and review of radiographic studies, pulse oximetry and re-evaluation of patient's condition.  Labs Review Labs Reviewed  COMPREHENSIVE METABOLIC PANEL - Abnormal; Notable for the following:    Sodium 136 (*)    Potassium 6.1 (*)    Chloride 88 (*)    BUN 37 (*)    Creatinine, Ser 9.82 (*)    Calcium 11.6 (*)    Total Protein 10.0 (*)    GFR calc non Af Amer 6 (*)    GFR calc Af Amer 7 (*)    All other components within normal limits  BASIC METABOLIC PANEL - Abnormal; Notable for the following:    Sodium 135 (*)    Potassium 7.1 (*)    Chloride 91 (*)    BUN 39 (*)    Creatinine, Ser 10.11 (*)    GFR calc non Af Amer 5 (*)    GFR calc Af Amer 6 (*)    All other components within normal limits  CBC  LIPASE, BLOOD  URINALYSIS, ROUTINE W REFLEX MICROSCOPIC   Imaging Review Ct Abdomen Pelvis Wo Contrast  03/08/2014   CLINICAL DATA:  Left flank and groin pain  EXAM: CT ABDOMEN AND PELVIS WITHOUT CONTRAST  TECHNIQUE: Multidetector CT imaging of the abdomen and pelvis was performed following the standard protocol without intravenous contrast.  COMPARISON:  DG ABD 2 VIEWS dated 07/21/2012; US RENAL dated 07/15/2012  FINDINGS: Cardiomegaly noted.  Lung bases are clear. Kidneys are small in size with lobulated contour and ill-defined hypodense cortical areas suggestive of cysts but incompletely characterized, for example 1.2 cm right upper renal pole in 1.2 cm left upper renal pole image 30. There is a more masslike appearance to the left lower renal pole with an apparent solid mass measuring 2.9 x 2.9 cm image 37. No hydroureteronephrosis. No radiopaque renal or ureteral calculus. Unenhanced liver, gallbladder, spleen, pancreas, and adrenal glands appear normal.  Appendix is normal. Bladder is normal. No  bowel wall thickening or focal segmental dilatation. Moderate atheromatous aortic calcification without aneurysm. No lymphadenopathy or free fluid.  No acute osseous abnormality.  L5-S1 disc degenerative change noted.  IMPRESSION: No acute intra-abdominal or pelvic pathology.  2.9 cm left lower renal pole mass which is suspicious for renal cell carcinoma or less likely benign neoplasm such as oncocytoma, or less likely hyperdense cyst. Other areas of renal cortical lobulation and hypodensity are incompletely characterized on this noncontrast exam. If the patient cannot receive contrast, then outpatient nonemergent renal ultrasound would be recommended as the next imaging exam for further evaluation. If the patient can receive IV contrast, then CT abdomen with contrast according to renal mass protocol would be recommended for further characterization on a nonemergent basis as an outpatient. Because the patient reportedly has stage IV chronic kidney disease, he is unlikely to be a candidate for contrast enhanced MRI.  These results will be called to the ordering clinician or representative by the Radiologist Assistant, and communication documented in the PACS Dashboard.   Electronically Signed   By: Conchita Paris M.D.   On: 03/08/2014 13:24     EKG Interpretation None      MDM   Final diagnoses:  Hyperkalemia  Flank pain  Left renal mass   Pt  presenting with left flank pain, completed dialysis today. No associated symptoms. He appears uncomfortable. Afebrile. Hypertensive at 158/118 on arrival, he has not taken medication this morning. Home bp meds ordered. Generalized abdominal tenderness, worse left, no peritoneal signs. Labs pending. CT w/o contrast pending. 1:59 PM CT scan showing a 2.9 cm left lower renal pole mass, suspicious for renal cell carcinoma. No other acute findings. Patient reports his pain has improved after receiving Dilaudid. Labs showed hyperkalemia with a potassium of 6.1, despite patient having dialysis earlier today. Plan to repeat potassium value. EKG showing peaked T waves and slight widening of QRS, will give calcium gluconate. Plan to consult nephrology. Case discussed with attending Dr. Rogene Houston who agrees with plan of care. 2:47 PM Nephrology in with pt. 4:08 PM Nephrology will dialyze patient. Repeat potassium on BNP 7.1. Patient will be admitted to medicine, admission accepted by Dr. Daleen Bo, [facility]. Insulin and D50 given per hospitalist.  Pt also seen by Dr. Rogene Houston.  Illene Labrador, PA-C 03/08/14 1609

## 2014-03-08 NOTE — ED Notes (Signed)
MD at bedside. 

## 2014-03-08 NOTE — H&P (Signed)
[facility] History and Physical  [name] [name] [id number] DOB: [birth year] DOA: 03/08/2014  Referring physician:  PCP: Pcp Not In System  Specialists:   Chief Complaint: nausea, vomiting   HPI: [name] [name] is a 48 y.o. male with PMH of HTN, HPL, CHF, polysubstance abuse, ESRD on HD, medication non-compliance who presents to the ED with his parents from dialysis complaining of sudden onset left sided flank pain. Pain has been constant since, described as cramping, radiating along the left side of his abdomen associated wiuth nsuea vomiting, no diarrhea, no fever;  He underwent to HD today and presented to ED with L flank pain found to have hyper K;  -denies chest pain, no SOB, no dizziness; no focal neuro symptoms   Review of Systems: The patient denies anorexia, fever, weight loss,, vision loss, decreased hearing, hoarseness, chest pain, syncope, dyspnea on exertion, peripheral edema, balance deficits, hemoptysis, abdominal pain, melena, hematochezia, severe indigestion/heartburn, hematuria, incontinence, genital sores, muscle weakness, suspicious skin lesions, transient blindness, difficulty walking, depression, unusual weight change, abnormal bleeding, enlarged lymph nodes, angioedema, and breast masses.    Past Medical History  Diagnosis Date  . Hypertensive urgency   . Hypertension   . CKD (chronic kidney disease) stage 4, GFR 15-29 ml/min   . Systolic CHF     Ef [id number]  . Medical non-compliance   . Polysubstance abuse   . ESRD (end stage renal disease)    Past Surgical History  Procedure Laterality Date  . Insertion of dialysis catheter  07/17/2012    Procedure: INSERTION OF DIALYSIS CATHETER;  Surgeon: Conrad [place], MD;  Location: [place];  Service: Vascular;  Laterality: Right;  right internal jugular  . Bascilic vein transposition  01/19/2013    Procedure: BASCILIC VEIN TRANSPOSITION;  Surgeon: Conrad [place], MD;  Location: [place];  Service: Vascular;  Laterality: Left;   left 2nd stage basilic vein transposition   Social History:  reports that he quit smoking about 18 months ago. His smoking use included Cigarettes. He smoked 0.00 packs per day. He does not have any smokeless tobacco history on file. He reports that he drinks about 4.2 ounces of alcohol per week. He reports that he uses illicit drugs (Marijuana). Home;  where does patient live--home, ALF, SNF? and with whom if at home? Yes;  Can patient participate in ADLs?  No Known Allergies  Family History  Problem Relation Age of Onset  . Hypertension Mother   . Hypertension Father     (be sure to complete)  Prior to Admission medications   Medication Sig Start Date End Date Taking? Authorizing Provider  carvedilol (COREG) 12.5 MG tablet Take 25 mg by mouth 2 (two) times daily with a meal. 07/21/12 03/08/14 Yes Orson Eva, MD  isosorbide mononitrate (IMDUR) 60 MG 24 hr tablet Take 60 mg by mouth daily. 07/21/12  Yes Orson Eva, MD  lisinopril (PRINIVIL,ZESTRIL) 10 MG tablet Take 10 mg by mouth daily. 07/22/12 03/08/14 Yes Orson Eva, MD  Multiple Vitamin (MULTIVITAMIN WITH MINERALS) TABS tablet Take 1 tablet by mouth daily.   Yes Historical Provider, MD  oxyCODONE (ROXICODONE) 5 MG immediate release tablet Take 1-2 tablets (5-10 mg total) by mouth every 4 (four) hours as needed for pain. 01/19/13  Yes Regina J Roczniak, PA-C  traMADol (ULTRAM) 50 MG tablet Take 100 mg by mouth every 6 (six) hours as needed (pain).   Yes Historical Provider, MD   Physical Exam: Filed Vitals:   03/08/14 1342  BP: 177/108  Pulse: 83  Temp: 98.7 F (37.1 C)  Resp: 17     General:  alert  Eyes: EOM-I  ENT: no oral ulcers   Neck: supple   Cardiovascular: s1,s2 rrr  Respiratory: CTA BL  Abdomen: soft, nt,nd   Skin: no rash  Musculoskeletal: no LE edema  Psychiatric: no hallucinations   Neurologic: CN 2-12 intact, motor 5/5 BL, sensation is intact   Labs on Admission:  Basic Metabolic Panel:  Recent  Labs Lab 03/08/14 1231 03/08/14 1447  NA 136* 135*  K 6.1* 7.1*  CL 88* 91*  CO2 27 27  GLUCOSE 88 92  BUN 37* 39*  CREATININE 9.82* 10.11*  CALCIUM 11.6* 10.5   Liver Function Tests:  Recent Labs Lab 03/08/14 1231  AST 22  ALT 11  ALKPHOS 63  BILITOT 0.7  PROT 10.0*  ALBUMIN 4.8    Recent Labs Lab 03/08/14 1231  LIPASE 26   No results found for this basename: AMMONIA,  in the last 168 hours CBC:  Recent Labs Lab 03/08/14 1231  WBC 10.5  HGB 15.4  HCT 45.4  MCV 93.6  PLT 224   Cardiac Enzymes: No results found for this basename: CKTOTAL, CKMB, CKMBINDEX, TROPONINI,  in the last 168 hours  BNP (last 3 results) No results found for this basename: PROBNP,  in the last 8760 hours CBG: No results found for this basename: GLUCAP,  in the last 168 hours  Radiological Exams on Admission: Ct Abdomen Pelvis Wo Contrast  03/08/2014   CLINICAL DATA:  Left flank and groin pain  EXAM: CT ABDOMEN AND PELVIS WITHOUT CONTRAST  TECHNIQUE: Multidetector CT imaging of the abdomen and pelvis was performed following the standard protocol without intravenous contrast.  COMPARISON:  DG ABD 2 VIEWS dated 07/21/2012; US RENAL dated 07/15/2012  FINDINGS: Cardiomegaly noted. Lung bases are clear. Kidneys are small in size with lobulated contour and ill-defined hypodense cortical areas suggestive of cysts but incompletely characterized, for example 1.2 cm right upper renal pole in 1.2 cm left upper renal pole image 30. There is a more masslike appearance to the left lower renal pole with an apparent solid mass measuring 2.9 x 2.9 cm image 37. No hydroureteronephrosis. No radiopaque renal or ureteral calculus. Unenhanced liver, gallbladder, spleen, pancreas, and adrenal glands appear normal.  Appendix is normal. Bladder is normal. No bowel wall thickening or focal segmental dilatation. Moderate atheromatous aortic calcification without aneurysm. No lymphadenopathy or free fluid.  No acute osseous  abnormality.  L5-S1 disc degenerative change noted.  IMPRESSION: No acute intra-abdominal or pelvic pathology.  2.9 cm left lower renal pole mass which is suspicious for renal cell carcinoma or less likely benign neoplasm such as oncocytoma, or less likely hyperdense cyst. Other areas of renal cortical lobulation and hypodensity are incompletely characterized on this noncontrast exam. If the patient cannot receive contrast, then outpatient nonemergent renal ultrasound would be recommended as the next imaging exam for further evaluation. If the patient can receive IV contrast, then CT abdomen with contrast according to renal mass protocol would be recommended for further characterization on a nonemergent basis as an outpatient. Because the patient reportedly has stage IV chronic kidney disease, he is unlikely to be a candidate for contrast enhanced MRI.  These results will be called to the ordering clinician or representative by the Radiologist Assistant, and communication documented in the PACS Dashboard.   Electronically Signed   By: Conchita Paris M.D.   On: 03/08/2014 13:24  EKG: Independently reviewed.   Assessment/Plan Principal Problem:   Hyperkalemia Active Problems:   HYPERTENSION   TOBACCO ABUSE   COCAINE ABUSE   CARDIOMYOPATHY, HYPERTENSIVE   Nausea & vomiting   48 y.o. male with PMH of HTN, HPL, CHF, polysubstance abuse, ESRD on HD, medication non-compliance who presents to the ED with his parents from dialysis complaining of sudden onset left sided flank pain found to have hyper K  1. ESRD on HD with hyper K likely worse with ACE;  -started IV Ca gluconate, IV insulin+D 50%, albuterol; recheck K; ED called nephrology for HD -d/c ACE, monitor on tele; ECG; defer management to nephrologist, appreciate th input  2. L flank pain with nausea, vomiting of unclear etiology ? musculoskeletal origin; patient have been cleaning floors   -CT abd: No acute intra-abdominal or pelvic  pathology -cont supportive care, pain control, antiemetics   3. HTN uncontrolled; likely medication non adherence  -resume carvedilol, NTG, added amlodipine, prn hydralazine; d/c ACE due to hyper K  4. 2.9 cm left lower renal pole mass; obtain renal US; need urology evaluation likely outpatient   5. Polysubstance abuse; per patient used, cannabis cocaine last week  -recommended to stop using drugs    Nephrology;  if consultant consulted, please document name and whether formally or informally consulted  Code Status: full (must indicate code status--if unknown or must be presumed, indicate so) Family Communication: d/w patient (indicate person spoken with, if applicable, with phone number if by telephone) Disposition Plan: home 24-48 hours  (indicate anticipated LOS)  Time spent: [place], [facility] Pager [phone]  If 7PM-7AM, please contact night-coverage [email or web address] Password [facility] 03/08/2014, 4:25 PM

## 2014-03-08 NOTE — ED Notes (Signed)
Communicated with Pharmacy to send Insulin

## 2014-03-08 NOTE — Consult Note (Signed)
[facility] Renal Consultation Note    Indication for Consultation:  Management of ESRD/hemodialysis; anemia, hypertension/volume and secondary hyperparathyroidism  HPI: [name] [name] is a 48 y.o. male with ESRD presumed secondary to HTN on dialysis since August 2013, hx of substance abuse, noncompliance with dialysis and medical regimen presented to dialysis today (usual MWF schedule) with complaints of left flank pain. Goal was 7.8 kg above EDW BP elevated pre and post whereas BP usually comes down after 2.5 to 3 hours of HD today post BP was 195/109 and 159/87 with P of 69. He was afebrile.  Pt. Came to the ED for further evaluation. CT showed 2.9 cm left lower renal pole mass suspicious for RCC.  He denied fever, chills, diarrhea, but has had intermittent N and V with flank pain. He has severe restless leg syndrome on dialysis and at night..  Post dialysis labs in the ED showed K 6.1 Cr 9.8 with repeat 7.1 Cr 10.1.  Calcium was also elevated at 11.5 down to 10.6 on recheck.  He had been on an added Ca bath and also received 1 mcg of Hectorol.  He will be admitted for emergent treatment of his hyperkalemia and further work up of renal mass.  Past Medical History  Diagnosis Date  . Hypertensive urgency   . Hypertension   . CKD (chronic kidney disease) stage 4, GFR 15-29 ml/min   . Systolic CHF     Ef [id number]  . Medical non-compliance   . Polysubstance abuse   . ESRD (end stage renal disease)    Past Surgical History  Procedure Laterality Date  . Insertion of dialysis catheter  07/17/2012    Procedure: INSERTION OF DIALYSIS CATHETER;  Surgeon: Conrad Buck Creek, MD;  Location: [place];  Service: Vascular;  Laterality: Right;  right internal jugular  . Bascilic vein transposition  01/19/2013    Procedure: BASCILIC VEIN TRANSPOSITION;  Surgeon: Conrad Concord, MD;  Location: [place];  Service: Vascular;  Laterality: Left;  left 2nd stage basilic vein transposition   Family History  Problem  Relation Age of Onset  . Hypertension Mother   . Hypertension Father    Social History:  reports that he quit smoking about 18 months ago. His smoking use included Cigarettes. He smoked 0.00 packs per day. He does not have any smokeless tobacco history on file. He reports that he drinks about 4.2 ounces of alcohol per week. He reports that he uses illicit drugs (Marijuana). No Known Allergies Prior to Admission medications   Medication Sig Start Date End Date Taking? Authorizing Provider  carvedilol (COREG) 12.5 MG tablet Take 25 mg by mouth 2 (two) times daily with a meal. 07/21/12 03/08/14 Yes Orson Eva, MD  isosorbide mononitrate (IMDUR) 60 MG 24 hr tablet Take 60 mg by mouth daily. 07/21/12  Yes Orson Eva, MD  lisinopril (PRINIVIL,ZESTRIL) 10 MG tablet Take 10 mg by mouth daily. 07/22/12 03/08/14 Yes Orson Eva, MD  Multiple Vitamin (MULTIVITAMIN WITH MINERALS) TABS tablet Take 1 tablet by mouth daily.   Yes Historical Provider, MD  oxyCODONE (ROXICODONE) 5 MG immediate release tablet Take 1-2 tablets (5-10 mg total) by mouth every 4 (four) hours as needed for pain. 01/19/13  Yes Regina J Roczniak, PA-C  traMADol (ULTRAM) 50 MG tablet Take 100 mg by mouth every 6 (six) hours as needed (pain).   Yes Historical Provider, MD   Current Facility-Administered Medications  Medication Dose Route Frequency Provider Last Rate Last Dose  . albuterol (  PROVENTIL) (2.5 MG/3ML) 0.083% nebulizer solution 2.5 mg  2.5 mg Nebulization Once Kinnie Feil, MD      . amLODipine (NORVASC) tablet 5 mg  5 mg Oral Daily Kinnie Feil, MD      . carvedilol (COREG) tablet 25 mg  25 mg Oral BID WC Illene Labrador, PA-C   25 mg at 03/08/14 1316  . dextrose 50 % solution 50 mL  1 ampule Intravenous Once Robyn M Albert, PA-C      . hydrALAZINE (APRESOLINE) injection 10 mg  10 mg Intravenous Q6H PRN Kinnie Feil, MD      . insulin aspart (novoLOG) injection 10 Units  10 Units Intravenous Once Robyn M Albert, PA-C      .  lisinopril (PRINIVIL,ZESTRIL) tablet 10 mg  10 mg Oral Daily Illene Labrador, PA-C   10 mg at 03/08/14 1328   Current Outpatient Prescriptions  Medication Sig Dispense Refill  . carvedilol (COREG) 12.5 MG tablet Take 25 mg by mouth 2 (two) times daily with a meal.      . isosorbide mononitrate (IMDUR) 60 MG 24 hr tablet Take 60 mg by mouth daily.      Marland Kitchen lisinopril (PRINIVIL,ZESTRIL) 10 MG tablet Take 10 mg by mouth daily.      . Multiple Vitamin (MULTIVITAMIN WITH MINERALS) TABS tablet Take 1 tablet by mouth daily.      Marland Kitchen oxyCODONE (ROXICODONE) 5 MG immediate release tablet Take 1-2 tablets (5-10 mg total) by mouth every 4 (four) hours as needed for pain.  30 tablet  0  . traMADol (ULTRAM) 50 MG tablet Take 100 mg by mouth every 6 (six) hours as needed (pain).       Labs: Basic Metabolic Panel:  Recent Labs Lab 03/08/14 1231 03/08/14 1447  NA 136* 135*  K 6.1* 7.1*  CL 88* 91*  CO2 27 27  GLUCOSE 88 92  BUN 37* 39*  CREATININE 9.82* 10.11*  CALCIUM 11.6* 10.5   Liver Function Tests:  Recent Labs Lab 03/08/14 1231  AST 22  ALT 11  ALKPHOS 63  BILITOT 0.7  PROT 10.0*  ALBUMIN 4.8    Recent Labs Lab 03/08/14 1231  LIPASE 26   CBC:  Recent Labs Lab 03/08/14 1231  WBC 10.5  HGB 15.4  HCT 45.4  MCV 93.6  PLT 224   Studies/Results: Ct Abdomen Pelvis Wo Contrast  03/08/2014   CLINICAL DATA:  Left flank and groin pain  EXAM: CT ABDOMEN AND PELVIS WITHOUT CONTRAST  TECHNIQUE: Multidetector CT imaging of the abdomen and pelvis was performed following the standard protocol without intravenous contrast.  COMPARISON:  DG ABD 2 VIEWS dated 07/21/2012; US RENAL dated 07/15/2012  FINDINGS: Cardiomegaly noted. Lung bases are clear. Kidneys are small in size with lobulated contour and ill-defined hypodense cortical areas suggestive of cysts but incompletely characterized, for example 1.2 cm right upper renal pole in 1.2 cm left upper renal pole image 30. There is a more masslike  appearance to the left lower renal pole with an apparent solid mass measuring 2.9 x 2.9 cm image 37. No hydroureteronephrosis. No radiopaque renal or ureteral calculus. Unenhanced liver, gallbladder, spleen, pancreas, and adrenal glands appear normal.  Appendix is normal. Bladder is normal. No bowel wall thickening or focal segmental dilatation. Moderate atheromatous aortic calcification without aneurysm. No lymphadenopathy or free fluid.  No acute osseous abnormality.  L5-S1 disc degenerative change noted.  IMPRESSION: No acute intra-abdominal or pelvic pathology.  2.9 cm left  lower renal pole mass which is suspicious for renal cell carcinoma or less likely benign neoplasm such as oncocytoma, or less likely hyperdense cyst. Other areas of renal cortical lobulation and hypodensity are incompletely characterized on this noncontrast exam. If the patient cannot receive contrast, then outpatient nonemergent renal ultrasound would be recommended as the next imaging exam for further evaluation. If the patient can receive IV contrast, then CT abdomen with contrast according to renal mass protocol would be recommended for further characterization on a nonemergent basis as an outpatient. Because the patient reportedly has stage IV chronic kidney disease, he is unlikely to be a candidate for contrast enhanced MRI.  These results will be called to the ordering clinician or representative by the Radiologist Assistant, and communication documented in the PACS Dashboard.   Electronically Signed   By: Conchita Paris M.D.   On: 03/08/2014 13:24   ROS: As per HPI otherwise negative  Physical Exam: Filed Vitals:   03/08/14 1126 03/08/14 1130 03/08/14 1328 03/08/14 1342  BP:  192/119 179/115 177/108  Pulse:  67  83  Temp:    98.7 F (37.1 C)  TempSrc:    Oral  Resp:    17  Height:      Weight:      SpO2: 100% 100%  100%     General:  Slender male ill appearing  Head: Normocephalic, atraumatic, sclera non-icteric,  mucus membranes are moist Neck: Supple. JVD not elevated. Lungs: Clear bilaterally to auscultation without wheezes, rales, or rhonchi. Breathing is unlabored. Heart: RRR with S1 S2. No murmurs, rubs, or gallops appreciated. Abdomen: Soft, non-tender, non-distended with normoactive bowel sounds.  Back:  Left flank tenderness M-S:  Strength and tone appear normal for age. Lower extremities:without edema or ischemic changes, no open wounds  Neuro: Alert and oriented X 3. Moves all extremities spontaneously. Psych:  Responds to questions appropriately with a normal affect. Dialysis Access: left lower AVF + bruit  Dialysis: MWF [facility]  4 hours runs 2.5 - 3 hr on 160 Optiflux with suboptimal kinetics due to not running full time;EDW 69 = but never attains so don't know true edw; 2K 3.5 Ca left lower AVF heparin 5000 no Epo, Hectorol 1 venofer 50 per week Recent labs: pre HD Ca usually 8 - 8.8 and Cr 16-17 P usually 10 - 12; iPTH 205  Assessment/Plan: 1. Left flank pain - new left mass - worrisome for RCC - nausea likely related to pain; 2. ESRD -  MWF - with hyperkalemia - 6.1 recheck 7.1 after reported 2.75 hours of dialysis; LONG history of noncompliance with dialysis reflected in outpatient and inpatient labs; today ran 2.5; Monday ran 3 hrs (today had pain but usually he says he signs off due to restless legs); recent access flows have been in usual range; Plan emergent HD on tele 3 hours today; repeat labs in am; may need additional dialysis Thursday 3. Hypertension/volume  -  More hypertensive today post HD possibly due to pain; BP usually normalizes post HD; UF another 1 -2 L on HD today 4. Anemia  - no ESA - hold venofer; Hgb 15.4 5. Metabolic bone disease -  On added Ca bath - post HD Ca was 11.6, on recheck was 10.5 - redialyze on 2.25 Ca bath today when we correct hyperkalemia - also had received 1 mcg of Hectorol today; noncompliance with binders evident by outpatient ^ P -  i 6. Nutrition - needs renal diet 7. Severe restless legs -  has been given 0.5 xanax pre HD; has tried a variety of meds in the past neurontin, mirapex, klonopin with variable inadequate success; underdialysis may be contributory --which has been explained to him  [name], PA-C [place] [phone] 03/08/2014, 4:25 PM   Pt seen, examined and agree w A/P as above. ESRD patient with poor record of HD compliance presenting with flank pain and small renal mass on CT, but also hyperkalemic most likely due to chronic noncompliance with dialysis duration. Plan extra HD this evening and recheck labs in am.   Kelly Splinter MD pager [phone]    cell [phone] 03/08/2014, 5:57 PM

## 2014-03-08 NOTE — Procedures (Signed)
I was present at this dialysis session, have reviewed the session itself and made  appropriate changes  Kelly Splinter MD (pgr) [phone]    ([phone] 03/08/2014, 6:05 PM

## 2014-03-08 NOTE — Progress Notes (Signed)
Pt admitted to the unit. Pt is alert and oriented. Pt oriented to room, staff, and call bell. Educated on fall safety plan. Bed in lowest position. Full assessment to Epic. Call bell with in reach. Educated to call for assists. Will continue to monitor. Boykin Baetz, RN 

## 2014-03-08 NOTE — ED Notes (Signed)
Pt states he started having left flank pain yesterday. States it is an aching pain. Denies any nausea or vomiting. States he is a M, W, F dialysis patient and just left dialysis. Pt couldn't take his whole treatment and stayed almost 3 hours.

## 2014-03-08 NOTE — ED Notes (Signed)
Critical K+ reported to ED

## 2014-03-08 NOTE — ED Notes (Signed)
PA notified of CT Result

## 2014-03-09 ENCOUNTER — Encounter (HOSPITAL_COMMUNITY): Payer: Self-pay | Admitting: Nephrology

## 2014-03-09 ENCOUNTER — Inpatient Hospital Stay (HOSPITAL_COMMUNITY): Payer: Medicare Other

## 2014-03-09 DIAGNOSIS — T82898A Other specified complication of vascular prosthetic devices, implants and grafts, initial encounter: Secondary | ICD-10-CM | POA: Diagnosis not present

## 2014-03-09 DIAGNOSIS — N189 Chronic kidney disease, unspecified: Secondary | ICD-10-CM

## 2014-03-09 DIAGNOSIS — N179 Acute kidney failure, unspecified: Secondary | ICD-10-CM

## 2014-03-09 DIAGNOSIS — I120 Hypertensive chronic kidney disease with stage 5 chronic kidney disease or end stage renal disease: Secondary | ICD-10-CM | POA: Diagnosis not present

## 2014-03-09 DIAGNOSIS — E875 Hyperkalemia: Secondary | ICD-10-CM | POA: Diagnosis not present

## 2014-03-09 DIAGNOSIS — N289 Disorder of kidney and ureter, unspecified: Secondary | ICD-10-CM | POA: Diagnosis not present

## 2014-03-09 DIAGNOSIS — N186 End stage renal disease: Secondary | ICD-10-CM

## 2014-03-09 LAB — RENAL FUNCTION PANEL
Albumin: 3.9 g/dL (ref 3.5–5.2)
BUN: 31 mg/dL — ABNORMAL HIGH (ref 6–23)
CO2: 23 mEq/L (ref 19–32)
Calcium: 9.6 mg/dL (ref 8.4–10.5)
Chloride: 88 mEq/L — ABNORMAL LOW (ref 96–112)
Creatinine, Ser: 8.77 mg/dL — ABNORMAL HIGH (ref 0.50–1.35)
GFR calc Af Amer: 7 mL/min — ABNORMAL LOW (ref >90)
GFR calc non Af Amer: 6 mL/min — ABNORMAL LOW (ref >90)
Glucose, Bld: 93 mg/dL (ref 70–99)
Phosphorus: 6.2 mg/dL — ABNORMAL HIGH (ref 2.3–4.6)
Potassium: 7.6 mEq/L (ref 3.7–5.3)
Sodium: 131 mEq/L — ABNORMAL LOW (ref 137–147)

## 2014-03-09 LAB — BASIC METABOLIC PANEL
BUN: 18 mg/dL (ref 6–23)
BUN: 24 mg/dL — ABNORMAL HIGH (ref 6–23)
BUN: 36 mg/dL — ABNORMAL HIGH (ref 6–23)
CALCIUM: 8.9 mg/dL (ref 8.4–10.5)
CALCIUM: 9.8 mg/dL (ref 8.4–10.5)
CHLORIDE: 88 meq/L — AB (ref 96–112)
CO2: 25 mEq/L (ref 19–32)
CO2: 28 meq/L (ref 19–32)
CO2: 29 meq/L (ref 19–32)
Calcium: 9.1 mg/dL (ref 8.4–10.5)
Chloride: 90 mEq/L — ABNORMAL LOW (ref 96–112)
Chloride: 92 mEq/L — ABNORMAL LOW (ref 96–112)
Creatinine, Ser: 5.82 mg/dL — ABNORMAL HIGH (ref 0.50–1.35)
Creatinine, Ser: 7.69 mg/dL — ABNORMAL HIGH (ref 0.50–1.35)
Creatinine, Ser: 8.64 mg/dL — ABNORMAL HIGH (ref 0.50–1.35)
GFR calc Af Amer: 12 mL/min — ABNORMAL LOW (ref >90)
GFR calc Af Amer: 9 mL/min — ABNORMAL LOW (ref >90)
GFR calc non Af Amer: 11 mL/min — ABNORMAL LOW (ref >90)
GFR calc non Af Amer: 7 mL/min — ABNORMAL LOW (ref >90)
GFR calc non Af Amer: 8 mL/min — ABNORMAL LOW (ref >90)
GFR, EST AFRICAN AMERICAN: 8 mL/min — AB (ref >90)
GLUCOSE: 90 mg/dL (ref 70–99)
Glucose, Bld: 112 mg/dL — ABNORMAL HIGH (ref 70–99)
Glucose, Bld: 95 mg/dL (ref 70–99)
POTASSIUM: 5.1 meq/L (ref 3.7–5.3)
Potassium: 4.3 mEq/L (ref 3.7–5.3)
Potassium: 5.1 mEq/L (ref 3.7–5.3)
Sodium: 132 mEq/L — ABNORMAL LOW (ref 137–147)
Sodium: 135 mEq/L — ABNORMAL LOW (ref 137–147)
Sodium: 137 mEq/L (ref 137–147)

## 2014-03-09 LAB — URINE MICROSCOPIC-ADD ON

## 2014-03-09 LAB — URINALYSIS, ROUTINE W REFLEX MICROSCOPIC
BILIRUBIN URINE: NEGATIVE
GLUCOSE, UA: NEGATIVE mg/dL
KETONES UR: NEGATIVE mg/dL
Nitrite: NEGATIVE
PH: 7 (ref 5.0–8.0)
Protein, ur: 100 mg/dL — AB
Specific Gravity, Urine: 1.012 (ref 1.005–1.030)
Urobilinogen, UA: 0.2 mg/dL (ref 0.0–1.0)

## 2014-03-09 LAB — CBC
HCT: 42.6 % (ref 39.0–52.0)
Hemoglobin: 14.4 g/dL (ref 13.0–17.0)
MCH: 32 pg (ref 26.0–34.0)
MCHC: 33.8 g/dL (ref 30.0–36.0)
MCV: 94.7 fL (ref 78.0–100.0)
PLATELETS: 187 10*3/uL (ref 150–400)
RBC: 4.5 MIL/uL (ref 4.22–5.81)
RDW: 14.7 % (ref 11.5–15.5)
WBC: 13.6 10*3/uL — ABNORMAL HIGH (ref 4.0–10.5)

## 2014-03-09 LAB — HEPATITIS B SURFACE ANTIGEN: HEP B S AG: NEGATIVE

## 2014-03-09 LAB — POTASSIUM: POTASSIUM: 6.5 meq/L — AB (ref 3.7–5.3)

## 2014-03-09 LAB — MRSA PCR SCREENING: MRSA BY PCR: POSITIVE — AB

## 2014-03-09 MED ORDER — PENTAFLUOROPROP-TETRAFLUOROETH EX AERO: 1.0000 "application " | INHALATION_SPRAY | CUTANEOUS | Status: DC | PRN

## 2014-03-09 MED ORDER — HEPARIN SODIUM (PORCINE) 1000 UNIT/ML DIALYSIS: 1000.0000 [IU] | INTRAMUSCULAR | Status: DC | PRN

## 2014-03-09 MED ORDER — OXYCODONE HCL 5 MG PO TABS: 5.0000 mg | ORAL_TABLET | ORAL | Status: DC | PRN

## 2014-03-09 MED ORDER — HYDROMORPHONE HCL PF 1 MG/ML IJ SOLN
1.0000 mg | INTRAMUSCULAR | Status: DC | PRN
  Administered 2014-03-09: 1 mg via INTRAVENOUS
  Filled 2014-03-09: qty 1

## 2014-03-09 MED ORDER — SODIUM CHLORIDE 0.9 % IV SOLN: 100.0000 mL | INTRAVENOUS | Status: DC | PRN

## 2014-03-09 MED ORDER — ALTEPLASE 2 MG IJ SOLR: 2.0000 mg | Freq: Once | INTRAMUSCULAR | Status: DC | PRN

## 2014-03-09 MED ORDER — CHLORHEXIDINE GLUCONATE CLOTH 2 % EX PADS
6.0000 | MEDICATED_PAD | Freq: Every day | CUTANEOUS | Status: DC
  Administered 2014-03-09: 6 via TOPICAL

## 2014-03-09 MED ORDER — NEPRO/CARBSTEADY PO LIQD: 237.0000 mL | ORAL | Status: DC | PRN

## 2014-03-09 MED ORDER — HEPARIN SODIUM (PORCINE) 1000 UNIT/ML DIALYSIS: 5000.0000 [IU] | Freq: Once | INTRAMUSCULAR | Status: DC

## 2014-03-09 MED ORDER — ALTEPLASE 2 MG IJ SOLR
2.0000 mg | Freq: Once | INTRAMUSCULAR | Status: DC | PRN
Start: 2014-03-09 — End: 2014-03-09

## 2014-03-09 MED ORDER — LIDOCAINE HCL (PF) 1 % IJ SOLN: 5.0000 mL | INTRAMUSCULAR | Status: DC | PRN

## 2014-03-09 MED ORDER — LIDOCAINE-PRILOCAINE 2.5-2.5 % EX CREA: 1.0000 "application " | TOPICAL_CREAM | CUTANEOUS | Status: DC | PRN

## 2014-03-09 MED ORDER — SODIUM CHLORIDE 0.9 % IV SOLN
100.0000 mL | INTRAVENOUS | Status: DC | PRN
Start: 2014-03-09 — End: 2014-03-09

## 2014-03-09 MED ORDER — LORAZEPAM 2 MG/ML IJ SOLN
2.0000 mg | Freq: Four times a day (QID) | INTRAMUSCULAR | Status: DC | PRN
  Administered 2014-03-09: 2 mg via INTRAVENOUS
  Filled 2014-03-09: qty 1

## 2014-03-09 MED ORDER — IOHEXOL 300 MG/ML  SOLN
100.0000 mL | Freq: Once | INTRAMUSCULAR | Status: AC | PRN
  Administered 2014-03-09: 32 mL via INTRAVENOUS

## 2014-03-09 MED ORDER — LORAZEPAM 2 MG PO TABS: 2.0000 mg | ORAL_TABLET | Freq: Four times a day (QID) | ORAL | Status: DC | PRN

## 2014-03-09 MED ORDER — MUPIROCIN 2 % EX OINT
1.0000 "application " | TOPICAL_OINTMENT | Freq: Two times a day (BID) | CUTANEOUS | Status: DC
  Administered 2014-03-09 (×2): 1 via NASAL
  Filled 2014-03-09: qty 22

## 2014-03-09 MED ORDER — HEPARIN SODIUM (PORCINE) 1000 UNIT/ML DIALYSIS: 20.0000 [IU]/kg | INTRAMUSCULAR | Status: DC | PRN

## 2014-03-09 MED ORDER — PNEUMOCOCCAL VAC POLYVALENT 25 MCG/0.5ML IJ INJ: 0.5000 mL | INJECTION | INTRAMUSCULAR | Status: DC

## 2014-03-09 MED ORDER — PNEUMOCOCCAL VAC POLYVALENT 25 MCG/0.5ML IJ INJ
0.5000 mL | INJECTION | INTRAMUSCULAR | Status: AC
  Administered 2014-03-09: 0.5 mL via INTRAMUSCULAR
  Filled 2014-03-09: qty 0.5

## 2014-03-09 MED ORDER — CARVEDILOL 12.5 MG PO TABS: 25.0000 mg | ORAL_TABLET | Freq: Two times a day (BID) | ORAL | Status: DC

## 2014-03-09 MED ORDER — LORAZEPAM 1 MG PO TABS: 2.0000 mg | ORAL_TABLET | Freq: Four times a day (QID) | ORAL | Status: DC | PRN

## 2014-03-09 MED ORDER — HEPARIN SODIUM (PORCINE) 1000 UNIT/ML IJ SOLN
5000.0000 [IU] | Freq: Once | INTRAMUSCULAR | Status: AC
  Administered 2014-03-09: 5000 [IU] via INTRAVENOUS

## 2014-03-09 NOTE — Progress Notes (Signed)
Pt with B/P = 74/58, remains asymptomatic, also with critical K notification of 7.6. Page to K. Schorr for notification with callback, page to on-call Renal MD per K. Schorr. Dr. Augustin Coupe with call back. New order for STAT recheck of potassium. MD to notify HD inpatient.  CRITICAL VALUE ALERT  Critical value received:  K = 7.6  Date of notification:  03/09/2014  Time of notification:  0612  Critical value read back:yes  Nurse who received alert:  Mady Gemma, RN  MD notified (1st page):  Lamar Blinks, NP  Time of first page:  [phone]  MD notified (2nd page): 0630  Time of second page: Dr. Augustin Coupe  Responding MD:  Lamar Blinks, NP; Dr. Augustin Coupe   Time MD [id number]; [phone]

## 2014-03-09 NOTE — Discharge Summary (Signed)
Physician Discharge Summary  [name] [name] [id number] DOB: 02/27/1966 DOA: 03/08/2014  PCP: Pcp Not In System  Admit date: 03/08/2014 Discharge date: 03/09/2014  Time spent: 50* minutes  Recommendations for Outpatient Follow-up:  1. *Follow up PCP in  2 weeks 2.   Discharge Diagnoses:  Principal Problem:   Hyperkalemia Active Problems:   HYPERTENSION   TOBACCO ABUSE   COCAINE ABUSE   CARDIOMYOPATHY, HYPERTENSIVE   Nausea & vomiting   Discharge Condition: Stable  Diet recommendation: Renal diet  Filed Weights   03/08/14 2015 03/08/14 2049 03/09/14 1422  Weight: 69.3 kg (152 lb 12.5 oz) 69.4 kg (153 lb) 69.4 kg (153 lb)    History of present illness:  48 y.o. male with PMH of HTN, HPL, CHF, polysubstance abuse, ESRD on HD, medication non-compliance who presents to the ED with his parents from dialysis complaining of sudden onset left sided flank pain. Pain has been constant since, described as cramping, radiating along the left side of his abdomen associated wiuth nsuea vomiting, no diarrhea, no fever; He underwent to HD today and presented to ED with L flank pain found to have hyper K;  -denies chest pain, no SOB, no dizziness; no focal neuro symptoms    Hospital Course:  ESRD on HD with hyper K likely worse with ACE;  -starteded IV Ca gluconate, IV insulin+D 50%, albuterol; underwent HD, now potasium is back to normal  -d/c ACE,   2. L flank pain with nausea, vomiting of unclear etiology ? musculoskeletal origin; patient have been cleaning floors  -CT abd: No acute intra-abdominal or pelvic pathology  No fever, Ct shows 2.9 cm mass on left, which will need evaluation as outpatient.  3. HTN uncontrolled; likely medication non adherence  Continue with Coreg  4. 2.9 cm left lower renal pole mass; CT scan with contrast will be needed in 3-4 weeks, ; need urology evaluation likely outpatient , discussed with patient.  5. Polysubstance abuse; per patient used, cannabis  cocaine last week  -recommended to stop using drugs   6. Anxiety/Restless leg syndrome- Improved with ativan, will give 10 tabs of ativan 2 mg , to be used before dialysis as it cause restless legs.   Procedures:  None  Consultations:  Nephrology  Discharge Exam: Filed Vitals:   03/09/14 1452  BP: 114/72  Pulse: 102  Temp: 98.5 F (36.9 C)  Resp: 18    General: Appear in no acute distress Cardiovascular: *S1s2 RRR Respiratory: Clear bilaterally  Discharge Instructions  Discharge Orders   Future Orders Complete By Expires   Diet - low sodium heart healthy  As directed    Discharge instructions  As directed    Comments:     You will need CT scan with contrast to evaluate the Renal mass in next 3-4 weeks   Increase activity slowly  As directed        Medication List    STOP taking these medications       lisinopril 10 MG tablet  Commonly known as:  PRINIVIL,ZESTRIL     traMADol 50 MG tablet  Commonly known as:  ULTRAM      TAKE these medications       carvedilol 12.5 MG tablet  Commonly known as:  COREG  Take 2 tablets (25 mg total) by mouth 2 (two) times daily with a meal.     isosorbide mononitrate 60 MG 24 hr tablet  Commonly known as:  IMDUR  Take 60 mg by mouth daily.  LORazepam 2 MG tablet  Commonly known as:  ATIVAN  Take 1 tablet (2 mg total) by mouth every 6 (six) hours as needed for anxiety.     multivitamin with minerals Tabs tablet  Take 1 tablet by mouth daily.     oxyCODONE 5 MG immediate release tablet  Commonly known as:  ROXICODONE  Take 1 tablet (5 mg total) by mouth every 4 (four) hours as needed.       No Known Allergies    The results of significant diagnostics from this hospitalization (including imaging, microbiology, ancillary and laboratory) are listed below for reference.    Significant Diagnostic Studies: Ct Abdomen Pelvis Wo Contrast  03/08/2014   CLINICAL DATA:  Left flank and groin pain  EXAM: CT ABDOMEN  AND PELVIS WITHOUT CONTRAST  TECHNIQUE: Multidetector CT imaging of the abdomen and pelvis was performed following the standard protocol without intravenous contrast.  COMPARISON:  DG ABD 2 VIEWS dated 07/21/2012; US RENAL dated 07/15/2012  FINDINGS: Cardiomegaly noted. Lung bases are clear. Kidneys are small in size with lobulated contour and ill-defined hypodense cortical areas suggestive of cysts but incompletely characterized, for example 1.2 cm right upper renal pole in 1.2 cm left upper renal pole image 30. There is a more masslike appearance to the left lower renal pole with an apparent solid mass measuring 2.9 x 2.9 cm image 37. No hydroureteronephrosis. No radiopaque renal or ureteral calculus. Unenhanced liver, gallbladder, spleen, pancreas, and adrenal glands appear normal.  Appendix is normal. Bladder is normal. No bowel wall thickening or focal segmental dilatation. Moderate atheromatous aortic calcification without aneurysm. No lymphadenopathy or free fluid.  No acute osseous abnormality.  L5-S1 disc degenerative change noted.  IMPRESSION: No acute intra-abdominal or pelvic pathology.  2.9 cm left lower renal pole mass which is suspicious for renal cell carcinoma or less likely benign neoplasm such as oncocytoma, or less likely hyperdense cyst. Other areas of renal cortical lobulation and hypodensity are incompletely characterized on this noncontrast exam. If the patient cannot receive contrast, then outpatient nonemergent renal ultrasound would be recommended as the next imaging exam for further evaluation. If the patient can receive IV contrast, then CT abdomen with contrast according to renal mass protocol would be recommended for further characterization on a nonemergent basis as an outpatient. Because the patient reportedly has stage IV chronic kidney disease, he is unlikely to be a candidate for contrast enhanced MRI.  These results will be called to the ordering clinician or representative by the  Radiologist Assistant, and communication documented in the PACS Dashboard.   Electronically Signed   By: Conchita Paris M.D.   On: 03/08/2014 13:24   US Renal  03/09/2014   CLINICAL DATA:  Question renal mass.  EXAM: RENAL/URINARY TRACT ULTRASOUND COMPLETE  COMPARISON:  Abdominal CT from the same day. Renal ultrasound 07/15/2012  FINDINGS: Right Kidney:  Small and echogenic kidney consistent with chronic kidney disease. Renal length measures 6.7 cm. There are 2 hypoechoic lesions within the upper pole, the largest measuring 15 mm. These have mid level echoes diffusely. No internal vascularity. No hydronephrosis.  Left Kidney:  Small (8 cm length) and echogenic kidney consistent with chronic kidney disease. There is a heterogeneous, mixed echogenicity mass from the lower pole which measures up to 3.3 cm. This was not seen on previous renal ultrasound 07/15/2012. There is questionable internal power Doppler flow within the lesion on 1 of the Doppler images, although there is motion artifact on the same image. Spectral  Doppler not applied.  Bladder:  Unremarkable for degree of distention. There is still production of urine.  IMPRESSION: 1. A 3.3 cm mass arising from the lower pole left kidney remains indeterminate between renal cell carcinoma and hemorrhage/debris containing cyst. Since the patient continues to make urine (the bladder is moderately distended), noncontrast MRI is recommended to evaluate for features of hemorrhagic cyst. Non-emergent MRI should be deferred until patient has been discharged for the acute illness, and can optimally cooperate with positioning and breath-holding instructions. 2. Two complex, cystic appearing lesions in the upper right kidney, up to 15 mm diameter.   Electronically Signed   By: Jorje Guild M.D.   On: 03/09/2014 08:35   Ir Shuntogram/ Fistulagram Left Mod Sed  03/09/2014   CLINICAL DATA:  48 year old male with end-stage renal disease presumed secondary to  hypertension on dialysis since August of 2013 with a past history of substance abuse and non compliance. He dialysis via a left upper extremity brachial artery to transposed basilic vein arteriovenous fistula surgically completed on 01/19/2013. Dialysis flow rates and pressures have been within normal limits, however the patient has been persistently hyperkalemic raising concern for re-circulation during hemodialysis. Fistulogram is warranted to assess for treatable abnormality.  EXAM: IR LEFT SHUNTOGRAM/FISTULAGRAM  Date: 03/09/2014  PROCEDURE: 1. Diagnostic left upper extremity fistulogram Interventional Radiologist:  Criselda Peaches, MD  ANESTHESIA/SEDATION: None  FLUOROSCOPY TIME:  36 seconds  CONTRAST:  72mL OMNIPAQUE IOHEXOL 300 MG/ML  SOLN  TECHNIQUE: Informed consent was obtained from the patient following explanation of the procedure, risks, benefits and alternatives. The patient understands, agrees and consents for the procedure. All questions were addressed. A time out was performed.  The arm was sterilely prepped and draped in the usual fashion using Betadine skin prep. The left upper extremity arteriovenous fistula was punctured with an 18 gauge angiocatheter. A diagnostic fistulogram was performed. Anatomy is consistent with a transposed brachial basilic arteriovenous fistula. The central veins are widely patent. There is no evidence of central stenosis.  At the transposed venous segment, there is a distal branch vessel heading down into the forearm which opacifies during the reflux image. This is unlikely be providing competing flow given the sufficient enlargement of the main draining basilic vein. No evidence of a focal stenosis. Mild dilatation of the venous stick zone. Flow is brisk.  The arterial anastomosis is widely patent. There is a mild focal stenosis of the brachial artery just proximal to the arterial venous anastomosis. Additionally, the more proximal renal artery is slightly abnormal  with a highly tortuous corkscrew configuration and almost beaded appearance. Query possible brachial artery fibromuscular dysplasia?  IMPRESSION: 1. Left forearm brachial artery to transposed basilic vein arteriovenous fistula. The arterial anastomosis, venous outflow and central veins are widely patent. 2. Abnormal appearance of the brachial artery proximal to the arteriovenous anastomosis. There is a highly tortuous, corkscrew appearance and some mild beading. Query possible brachial artery fibromuscular dysplasia versus atherosclerotic stenoses. In either case, flow is brisk through the fistula and there has been no reported issue with poor venous access flow rates or distal forearm or hand ischemia and therefore these findings are likely not clinically significant. Signed,  Criselda Peaches, MD  [facility]  [facility]   Electronically Signed   By: Jacqulynn Cadet M.D.   On: 03/09/2014 16:35    Microbiology: Recent Results (from the past 240 hour(s))  MRSA PCR SCREENING     Status: Abnormal   Collection  Time    03/08/14 11:36 PM      Result Value Ref Range Status   MRSA by PCR POSITIVE (*) NEGATIVE Final   Comment:            The GeneXpert MRSA Assay (FDA     approved for NASAL specimens     only), is one component of a     comprehensive MRSA colonization     surveillance program. It is not     intended to diagnose MRSA     infection nor to guide or     monitor treatment for     MRSA infections.     RESULT CALLED TO, READ BACK BY AND VERIFIED WITH:     MOORE,K RN [id number] AT 0159 SKEEN,P     Labs: Basic Metabolic Panel:  Recent Labs Lab 03/08/14 1231 03/08/14 1447 03/08/14 1840 03/08/14 2341 03/09/14 0500 03/09/14 0735 03/09/14 1059  NA 136* 135* 138 135* 131*  --  132*  K 6.1* 7.1* 3.7 5.1 7.6* 6.5* 5.1  CL 88* 91* 96 90* 88*  --  88*  CO2 27 27  --  28 23  --  25  GLUCOSE 88 92 108* 95 93  --  112*  BUN 37* 39* 22 24* 31*   --  36*  CREATININE 9.82* 10.11* 6.40* 7.69* 8.77*  --  8.64*  CALCIUM 11.6* 10.5  --  9.8 9.6  --  9.1  PHOS  --   --   --   --  6.2*  --   --    Liver Function Tests:  Recent Labs Lab 03/08/14 1231 03/09/14 0500  AST 22  --   ALT 11  --   ALKPHOS 63  --   BILITOT 0.7  --   PROT 10.0*  --   ALBUMIN 4.8 3.9    Recent Labs Lab 03/08/14 1231  LIPASE 26   No results found for this basename: AMMONIA,  in the last 168 hours CBC:  Recent Labs Lab 03/08/14 1231 03/08/14 1840 03/08/14 2341  WBC 10.5  --  13.6*  HGB 15.4 16.3 14.4  HCT 45.4 48.0 42.6  MCV 93.6  --  94.7  PLT 224  --  187   Cardiac Enzymes: No results found for this basename: CKTOTAL, CKMB, CKMBINDEX, TROPONINI,  in the last 168 hours BNP: BNP (last 3 results) No results found for this basename: PROBNP,  in the last 8760 hours CBG: No results found for this basename: GLUCAP,  in the last 168 hours     Signed:  Ericca Labra S  [facility] 03/09/2014, 6:00 PM

## 2014-03-09 NOTE — Progress Notes (Signed)
Subjective: K+ went down w HD yesterday but is back up today , 6.5 on repeat specimen. Cr went from 10 pre HD > 6 post HD > 8 today  Filed Vitals:   03/08/14 2049 03/09/14 0553 03/09/14 0615 03/09/14 0802  BP: 136/109 74/52 74/58 103/75  Pulse: 107 85  96  Temp: 100 F (37.8 C) 99.3 F (37.4 C)  99 F (37.2 C)  TempSrc: Oral Oral  Oral  Resp: 14 16  18   Height: 5\' 9"  (1.753 m)     Weight: 69.4 kg (153 lb)     SpO2: 100% 100%  98%   Exam: Alert, calm, no distress No jvd RRR no MRG Chest clear bilat Abd soft, NTND No LE edema Neuro is nf, ox3 LFA AVF patent   Dialysis: [facility] 4 hours runs 2.5 - 3 hr on 160 Optiflux with suboptimal kinetics due to not running full time;EDW 69 = but never attains so don't know true edw; 2K 3.5 Ca left lower AVF heparin 5000 EPO none   Hectorol 1  Venofer 50 per week  Recent labs: pre HD Ca usually 8 - 8.8 and Cr 16-17 P usually 10 - 12; iPTH 205  Assessment: 1 Hyperkalemia- refractory to HD, suggesting access recirculation and/or marked K overload 2 ESRD 3 Renal mass on CT- needs contrast study when stable 4 HTN BP down, on coreg/acei, at dry wt today 5 Hx systolic CHF   Plan- IR consulted for fistulogram, pt on is way down, plan HD later today after procedure    Kelly Splinter MD  pager [phone]    cell [phone]  03/09/2014, 9:43 AM     Recent Labs Lab 03/08/14 1447 03/08/14 1840 03/08/14 2341 03/09/14 0500 03/09/14 0735  NA 135* 138 135* 131*  --   K 7.1* 3.7 5.1 7.6* 6.5*  CL 91* 96 90* 88*  --   CO2 27  --  28 23  --   GLUCOSE 92 108* 95 93  --   BUN 39* 22 24* 31*  --   CREATININE 10.11* 6.40* 7.69* 8.77*  --   CALCIUM 10.5  --  9.8 9.6  --   PHOS  --   --   --  6.2*  --     Recent Labs Lab 03/08/14 1231 03/09/14 0500  AST 22  --   ALT 11  --   ALKPHOS 63  --   BILITOT 0.7  --   PROT 10.0*  --   ALBUMIN 4.8 3.9    Recent Labs Lab 03/08/14 1231 03/08/14 1840 03/08/14 2341  WBC 10.5  --  13.6*   HGB 15.4 16.3 14.4  HCT 45.4 48.0 42.6  MCV 93.6  --  94.7  PLT 224  --  187   . amLODipine  5 mg Oral Daily  . carvedilol  25 mg Oral BID WC  . Chlorhexidine Gluconate Cloth  6 each Topical Q0600  . heparin  5,000 Units Subcutaneous 3 times per day  . isosorbide mononitrate  60 mg Oral Daily  . multivitamin with minerals  1 tablet Oral Daily  . mupirocin ointment  1 application Nasal BID  . [START ON 03/10/2014] pneumococcal 23 valent vaccine  0.5 mL Intramuscular Tomorrow-1000  . sodium chloride  3 mL Intravenous Q12H     acetaminophen, acetaminophen, hydrALAZINE, LORazepam, ondansetron (ZOFRAN) IV, ondansetron

## 2014-03-09 NOTE — Progress Notes (Signed)
Spoke to Dr. Mattingly/Dr.Schertz who stated it's okay for patient to be discharged tonight. Informed Dr. Darrick Meigs of this information who stated he would look into discharging patient, per patient's request.   Aaron Edelman, Lonni Fix

## 2014-03-09 NOTE — Progress Notes (Addendum)
CRITICAL VALUE ALERT  Critical value received:  Potassium 6.5   Date of notification:  03/09/14   Time of notification:  08:36  Critical value read back:yes  Nurse who received alert:  Virgilio Frees   MD notified (1st page):  Dr. Darrick Meigs  Time of first page:  MD in hallway (08:36)  MD notified (2nd page):  Time of second page:  Responding MD:  Dr. Darrick Meigs  Time MD responded:  08:36  Also notified Dr. Melvia Heaps who stated would speak with patient

## 2014-03-09 NOTE — Progress Notes (Signed)
Pt to be discharged to home. PIV removed. Telemetry box #20 removed and returned the nurse's station. Follow-up appts and medications reviewed. Pt aware he needs to go to his outpt dialysis center tomorrow for his scheduled HD.   Aaron Edelman, Nelvin [place]

## 2014-03-09 NOTE — Progress Notes (Signed)
Patient rec'd pneumonia vaccine prior to discharge.  [name] [name], [name]

## 2014-03-09 NOTE — Progress Notes (Signed)
Pt declines to go to HD until result of potassium recheck first. Spoke with Dr. Moshe Cipro for notification. Pt remains stable and asymptomatic. Oncoming shift made aware for continued monitoring. Dorthey Sawyer, RN

## 2014-03-14 DIAGNOSIS — N186 End stage renal disease: Secondary | ICD-10-CM | POA: Diagnosis not present

## 2014-03-15 DIAGNOSIS — N186 End stage renal disease: Secondary | ICD-10-CM | POA: Diagnosis not present

## 2014-03-15 DIAGNOSIS — D509 Iron deficiency anemia, unspecified: Secondary | ICD-10-CM | POA: Diagnosis not present

## 2014-03-15 DIAGNOSIS — N2581 Secondary hyperparathyroidism of renal origin: Secondary | ICD-10-CM | POA: Diagnosis not present

## 2014-03-15 DIAGNOSIS — D631 Anemia in chronic kidney disease: Secondary | ICD-10-CM | POA: Diagnosis not present

## 2014-03-15 DIAGNOSIS — Z992 Dependence on renal dialysis: Secondary | ICD-10-CM | POA: Diagnosis not present

## 2014-03-15 DIAGNOSIS — N039 Chronic nephritic syndrome with unspecified morphologic changes: Secondary | ICD-10-CM | POA: Diagnosis not present

## 2014-03-17 DIAGNOSIS — D509 Iron deficiency anemia, unspecified: Secondary | ICD-10-CM | POA: Diagnosis not present

## 2014-03-17 DIAGNOSIS — Z992 Dependence on renal dialysis: Secondary | ICD-10-CM | POA: Diagnosis not present

## 2014-03-17 DIAGNOSIS — N2581 Secondary hyperparathyroidism of renal origin: Secondary | ICD-10-CM | POA: Diagnosis not present

## 2014-03-17 DIAGNOSIS — N039 Chronic nephritic syndrome with unspecified morphologic changes: Secondary | ICD-10-CM | POA: Diagnosis not present

## 2014-03-17 DIAGNOSIS — N186 End stage renal disease: Secondary | ICD-10-CM | POA: Diagnosis not present

## 2014-03-20 DIAGNOSIS — N186 End stage renal disease: Secondary | ICD-10-CM | POA: Diagnosis not present

## 2014-03-20 DIAGNOSIS — N2581 Secondary hyperparathyroidism of renal origin: Secondary | ICD-10-CM | POA: Diagnosis not present

## 2014-03-20 DIAGNOSIS — Z992 Dependence on renal dialysis: Secondary | ICD-10-CM | POA: Diagnosis not present

## 2014-03-20 DIAGNOSIS — D509 Iron deficiency anemia, unspecified: Secondary | ICD-10-CM | POA: Diagnosis not present

## 2014-03-20 DIAGNOSIS — N039 Chronic nephritic syndrome with unspecified morphologic changes: Secondary | ICD-10-CM | POA: Diagnosis not present

## 2014-03-22 DIAGNOSIS — Z992 Dependence on renal dialysis: Secondary | ICD-10-CM | POA: Diagnosis not present

## 2014-03-22 DIAGNOSIS — N186 End stage renal disease: Secondary | ICD-10-CM | POA: Diagnosis not present

## 2014-03-22 DIAGNOSIS — D509 Iron deficiency anemia, unspecified: Secondary | ICD-10-CM | POA: Diagnosis not present

## 2014-03-22 DIAGNOSIS — N2581 Secondary hyperparathyroidism of renal origin: Secondary | ICD-10-CM | POA: Diagnosis not present

## 2014-03-22 DIAGNOSIS — D631 Anemia in chronic kidney disease: Secondary | ICD-10-CM | POA: Diagnosis not present

## 2014-03-27 DIAGNOSIS — D631 Anemia in chronic kidney disease: Secondary | ICD-10-CM | POA: Diagnosis not present

## 2014-03-27 DIAGNOSIS — D509 Iron deficiency anemia, unspecified: Secondary | ICD-10-CM | POA: Diagnosis not present

## 2014-03-27 DIAGNOSIS — N2581 Secondary hyperparathyroidism of renal origin: Secondary | ICD-10-CM | POA: Diagnosis not present

## 2014-03-27 DIAGNOSIS — N186 End stage renal disease: Secondary | ICD-10-CM | POA: Diagnosis not present

## 2014-03-27 DIAGNOSIS — Z992 Dependence on renal dialysis: Secondary | ICD-10-CM | POA: Diagnosis not present

## 2014-03-28 DIAGNOSIS — D4959 Neoplasm of unspecified behavior of other genitourinary organ: Secondary | ICD-10-CM | POA: Diagnosis not present

## 2014-03-28 DIAGNOSIS — N269 Renal sclerosis, unspecified: Secondary | ICD-10-CM | POA: Diagnosis not present

## 2014-03-28 DIAGNOSIS — N281 Cyst of kidney, acquired: Secondary | ICD-10-CM | POA: Diagnosis not present

## 2014-03-29 ENCOUNTER — Other Ambulatory Visit (HOSPITAL_COMMUNITY): Payer: Self-pay | Admitting: Urology

## 2014-03-29 DIAGNOSIS — Z992 Dependence on renal dialysis: Secondary | ICD-10-CM | POA: Diagnosis not present

## 2014-03-29 DIAGNOSIS — D49519 Neoplasm of unspecified behavior of unspecified kidney: Secondary | ICD-10-CM

## 2014-03-29 DIAGNOSIS — N2581 Secondary hyperparathyroidism of renal origin: Secondary | ICD-10-CM | POA: Diagnosis not present

## 2014-03-29 DIAGNOSIS — D631 Anemia in chronic kidney disease: Secondary | ICD-10-CM | POA: Diagnosis not present

## 2014-03-29 DIAGNOSIS — D509 Iron deficiency anemia, unspecified: Secondary | ICD-10-CM | POA: Diagnosis not present

## 2014-03-29 DIAGNOSIS — N186 End stage renal disease: Secondary | ICD-10-CM | POA: Diagnosis not present

## 2014-03-31 DIAGNOSIS — D509 Iron deficiency anemia, unspecified: Secondary | ICD-10-CM | POA: Diagnosis not present

## 2014-03-31 DIAGNOSIS — N2581 Secondary hyperparathyroidism of renal origin: Secondary | ICD-10-CM | POA: Diagnosis not present

## 2014-03-31 DIAGNOSIS — N039 Chronic nephritic syndrome with unspecified morphologic changes: Secondary | ICD-10-CM | POA: Diagnosis not present

## 2014-03-31 DIAGNOSIS — N186 End stage renal disease: Secondary | ICD-10-CM | POA: Diagnosis not present

## 2014-03-31 DIAGNOSIS — Z992 Dependence on renal dialysis: Secondary | ICD-10-CM | POA: Diagnosis not present

## 2014-04-03 DIAGNOSIS — N2581 Secondary hyperparathyroidism of renal origin: Secondary | ICD-10-CM | POA: Diagnosis not present

## 2014-04-03 DIAGNOSIS — Z992 Dependence on renal dialysis: Secondary | ICD-10-CM | POA: Diagnosis not present

## 2014-04-03 DIAGNOSIS — N186 End stage renal disease: Secondary | ICD-10-CM | POA: Diagnosis not present

## 2014-04-03 DIAGNOSIS — D509 Iron deficiency anemia, unspecified: Secondary | ICD-10-CM | POA: Diagnosis not present

## 2014-04-03 DIAGNOSIS — D631 Anemia in chronic kidney disease: Secondary | ICD-10-CM | POA: Diagnosis not present

## 2014-04-05 DIAGNOSIS — D631 Anemia in chronic kidney disease: Secondary | ICD-10-CM | POA: Diagnosis not present

## 2014-04-05 DIAGNOSIS — N2581 Secondary hyperparathyroidism of renal origin: Secondary | ICD-10-CM | POA: Diagnosis not present

## 2014-04-05 DIAGNOSIS — D509 Iron deficiency anemia, unspecified: Secondary | ICD-10-CM | POA: Diagnosis not present

## 2014-04-05 DIAGNOSIS — N186 End stage renal disease: Secondary | ICD-10-CM | POA: Diagnosis not present

## 2014-04-05 DIAGNOSIS — Z992 Dependence on renal dialysis: Secondary | ICD-10-CM | POA: Diagnosis not present

## 2014-04-06 ENCOUNTER — Ambulatory Visit (HOSPITAL_COMMUNITY)
Admission: RE | Admit: 2014-04-06 | Discharge: 2014-04-06 | Disposition: A | Discharge status: Home / Self Care | Payer: Medicare Other | Source: Ambulatory Visit | Attending: Urology | Admitting: Urology

## 2014-04-08 DIAGNOSIS — N2581 Secondary hyperparathyroidism of renal origin: Secondary | ICD-10-CM | POA: Diagnosis not present

## 2014-04-08 DIAGNOSIS — N186 End stage renal disease: Secondary | ICD-10-CM | POA: Diagnosis not present

## 2014-04-08 DIAGNOSIS — Z992 Dependence on renal dialysis: Secondary | ICD-10-CM | POA: Diagnosis not present

## 2014-04-08 DIAGNOSIS — D509 Iron deficiency anemia, unspecified: Secondary | ICD-10-CM | POA: Diagnosis not present

## 2014-04-08 DIAGNOSIS — D631 Anemia in chronic kidney disease: Secondary | ICD-10-CM | POA: Diagnosis not present

## 2014-04-10 DIAGNOSIS — N2581 Secondary hyperparathyroidism of renal origin: Secondary | ICD-10-CM | POA: Diagnosis not present

## 2014-04-10 DIAGNOSIS — N039 Chronic nephritic syndrome with unspecified morphologic changes: Secondary | ICD-10-CM | POA: Diagnosis not present

## 2014-04-10 DIAGNOSIS — N186 End stage renal disease: Secondary | ICD-10-CM | POA: Diagnosis not present

## 2014-04-10 DIAGNOSIS — Z992 Dependence on renal dialysis: Secondary | ICD-10-CM | POA: Diagnosis not present

## 2014-04-10 DIAGNOSIS — D509 Iron deficiency anemia, unspecified: Secondary | ICD-10-CM | POA: Diagnosis not present

## 2014-04-13 DIAGNOSIS — D509 Iron deficiency anemia, unspecified: Secondary | ICD-10-CM | POA: Diagnosis not present

## 2014-04-13 DIAGNOSIS — D631 Anemia in chronic kidney disease: Secondary | ICD-10-CM | POA: Diagnosis not present

## 2014-04-13 DIAGNOSIS — Z992 Dependence on renal dialysis: Secondary | ICD-10-CM | POA: Diagnosis not present

## 2014-04-13 DIAGNOSIS — N186 End stage renal disease: Secondary | ICD-10-CM | POA: Diagnosis not present

## 2014-04-13 DIAGNOSIS — N2581 Secondary hyperparathyroidism of renal origin: Secondary | ICD-10-CM | POA: Diagnosis not present

## 2014-04-14 DIAGNOSIS — D509 Iron deficiency anemia, unspecified: Secondary | ICD-10-CM | POA: Diagnosis not present

## 2014-04-14 DIAGNOSIS — N2581 Secondary hyperparathyroidism of renal origin: Secondary | ICD-10-CM | POA: Diagnosis not present

## 2014-04-14 DIAGNOSIS — D631 Anemia in chronic kidney disease: Secondary | ICD-10-CM | POA: Diagnosis not present

## 2014-04-14 DIAGNOSIS — N039 Chronic nephritic syndrome with unspecified morphologic changes: Secondary | ICD-10-CM | POA: Diagnosis not present

## 2014-04-14 DIAGNOSIS — N186 End stage renal disease: Secondary | ICD-10-CM | POA: Diagnosis not present

## 2014-04-14 DIAGNOSIS — Z992 Dependence on renal dialysis: Secondary | ICD-10-CM | POA: Diagnosis not present

## 2014-04-17 DIAGNOSIS — N039 Chronic nephritic syndrome with unspecified morphologic changes: Secondary | ICD-10-CM | POA: Diagnosis not present

## 2014-04-17 DIAGNOSIS — Z992 Dependence on renal dialysis: Secondary | ICD-10-CM | POA: Diagnosis not present

## 2014-04-17 DIAGNOSIS — D509 Iron deficiency anemia, unspecified: Secondary | ICD-10-CM | POA: Diagnosis not present

## 2014-04-17 DIAGNOSIS — N186 End stage renal disease: Secondary | ICD-10-CM | POA: Diagnosis not present

## 2014-04-17 DIAGNOSIS — N2581 Secondary hyperparathyroidism of renal origin: Secondary | ICD-10-CM | POA: Diagnosis not present

## 2014-04-18 ENCOUNTER — Inpatient Hospital Stay (HOSPITAL_COMMUNITY): Payer: Medicare Other

## 2014-04-18 ENCOUNTER — Encounter (HOSPITAL_COMMUNITY): Payer: Self-pay | Admitting: Emergency Medicine

## 2014-04-18 ENCOUNTER — Inpatient Hospital Stay (HOSPITAL_COMMUNITY)
Admission: EM | Admit: 2014-04-18 | Discharge: 2014-04-21 | DRG: 444 | Disposition: A | Discharge status: Home / Self Care | Payer: Medicare Other | Attending: Internal Medicine | Admitting: Internal Medicine

## 2014-04-18 ENCOUNTER — Emergency Department (HOSPITAL_COMMUNITY): Payer: Medicare Other

## 2014-04-18 DIAGNOSIS — R112 Nausea with vomiting, unspecified: Secondary | ICD-10-CM

## 2014-04-18 DIAGNOSIS — N186 End stage renal disease: Secondary | ICD-10-CM

## 2014-04-18 DIAGNOSIS — N19 Unspecified kidney failure: Secondary | ICD-10-CM

## 2014-04-18 DIAGNOSIS — I10 Essential (primary) hypertension: Secondary | ICD-10-CM

## 2014-04-18 DIAGNOSIS — K8010 Calculus of gallbladder with chronic cholecystitis without obstruction: Principal | ICD-10-CM | POA: Diagnosis present

## 2014-04-18 DIAGNOSIS — M899 Disorder of bone, unspecified: Secondary | ICD-10-CM | POA: Diagnosis present

## 2014-04-18 DIAGNOSIS — F1010 Alcohol abuse, uncomplicated: Secondary | ICD-10-CM

## 2014-04-18 DIAGNOSIS — I5040 Unspecified combined systolic (congestive) and diastolic (congestive) heart failure: Secondary | ICD-10-CM

## 2014-04-18 DIAGNOSIS — Z91199 Patient's noncompliance with other medical treatment and regimen due to unspecified reason: Secondary | ICD-10-CM

## 2014-04-18 DIAGNOSIS — I5042 Chronic combined systolic (congestive) and diastolic (congestive) heart failure: Secondary | ICD-10-CM | POA: Diagnosis present

## 2014-04-18 DIAGNOSIS — I120 Hypertensive chronic kidney disease with stage 5 chronic kidney disease or end stage renal disease: Secondary | ICD-10-CM | POA: Diagnosis not present

## 2014-04-18 DIAGNOSIS — D638 Anemia in other chronic diseases classified elsewhere: Secondary | ICD-10-CM | POA: Diagnosis present

## 2014-04-18 DIAGNOSIS — R7989 Other specified abnormal findings of blood chemistry: Secondary | ICD-10-CM

## 2014-04-18 DIAGNOSIS — K838 Other specified diseases of biliary tract: Secondary | ICD-10-CM | POA: Diagnosis not present

## 2014-04-18 DIAGNOSIS — D649 Anemia, unspecified: Secondary | ICD-10-CM | POA: Diagnosis not present

## 2014-04-18 DIAGNOSIS — R109 Unspecified abdominal pain: Secondary | ICD-10-CM

## 2014-04-18 DIAGNOSIS — G2581 Restless legs syndrome: Secondary | ICD-10-CM | POA: Diagnosis present

## 2014-04-18 DIAGNOSIS — E875 Hyperkalemia: Secondary | ICD-10-CM

## 2014-04-18 DIAGNOSIS — F1210 Cannabis abuse, uncomplicated: Secondary | ICD-10-CM | POA: Diagnosis present

## 2014-04-18 DIAGNOSIS — K819 Cholecystitis, unspecified: Secondary | ICD-10-CM

## 2014-04-18 DIAGNOSIS — I509 Heart failure, unspecified: Secondary | ICD-10-CM | POA: Diagnosis present

## 2014-04-18 DIAGNOSIS — F1410 Cocaine abuse, uncomplicated: Secondary | ICD-10-CM

## 2014-04-18 DIAGNOSIS — N179 Acute kidney failure, unspecified: Secondary | ICD-10-CM

## 2014-04-18 DIAGNOSIS — Z8249 Family history of ischemic heart disease and other diseases of the circulatory system: Secondary | ICD-10-CM

## 2014-04-18 DIAGNOSIS — D631 Anemia in chronic kidney disease: Secondary | ICD-10-CM | POA: Diagnosis not present

## 2014-04-18 DIAGNOSIS — D62 Acute posthemorrhagic anemia: Secondary | ICD-10-CM

## 2014-04-18 DIAGNOSIS — R0989 Other specified symptoms and signs involving the circulatory and respiratory systems: Secondary | ICD-10-CM | POA: Diagnosis not present

## 2014-04-18 DIAGNOSIS — Z992 Dependence on renal dialysis: Secondary | ICD-10-CM

## 2014-04-18 DIAGNOSIS — R778 Other specified abnormalities of plasma proteins: Secondary | ICD-10-CM

## 2014-04-18 DIAGNOSIS — N2581 Secondary hyperparathyroidism of renal origin: Secondary | ICD-10-CM | POA: Diagnosis not present

## 2014-04-18 DIAGNOSIS — Z9119 Patient's noncompliance with other medical treatment and regimen: Secondary | ICD-10-CM

## 2014-04-18 DIAGNOSIS — N189 Chronic kidney disease, unspecified: Secondary | ICD-10-CM

## 2014-04-18 DIAGNOSIS — E872 Acidosis, unspecified: Secondary | ICD-10-CM

## 2014-04-18 DIAGNOSIS — I119 Hypertensive heart disease without heart failure: Secondary | ICD-10-CM

## 2014-04-18 DIAGNOSIS — R040 Epistaxis: Secondary | ICD-10-CM

## 2014-04-18 DIAGNOSIS — F17200 Nicotine dependence, unspecified, uncomplicated: Secondary | ICD-10-CM

## 2014-04-18 DIAGNOSIS — K8000 Calculus of gallbladder with acute cholecystitis without obstruction: Secondary | ICD-10-CM | POA: Diagnosis not present

## 2014-04-18 DIAGNOSIS — K8020 Calculus of gallbladder without cholecystitis without obstruction: Secondary | ICD-10-CM

## 2014-04-18 DIAGNOSIS — F1910 Other psychoactive substance abuse, uncomplicated: Secondary | ICD-10-CM | POA: Diagnosis present

## 2014-04-18 DIAGNOSIS — M949 Disorder of cartilage, unspecified: Secondary | ICD-10-CM

## 2014-04-18 DIAGNOSIS — F17210 Nicotine dependence, cigarettes, uncomplicated: Secondary | ICD-10-CM | POA: Diagnosis present

## 2014-04-18 HISTORY — DX: Cholecystitis, unspecified: K81.9

## 2014-04-18 HISTORY — DX: Unspecified combined systolic (congestive) and diastolic (congestive) heart failure: I50.40

## 2014-04-18 LAB — TROPONIN I
Troponin I: 0.39 ng/mL (ref <0.30)
Troponin I: 0.35 ng/mL (ref <0.30)
Troponin I: <0.3 ng/mL (ref <0.30)

## 2014-04-18 LAB — CBC WITH DIFFERENTIAL/PLATELET
Basophils Absolute: 0 10*3/uL (ref 0.0–0.1)
Basophils Relative: 1 % (ref 0–1)
Eosinophils Absolute: 0.4 10*3/uL (ref 0.0–0.7)
Eosinophils Relative: 7 % — ABNORMAL HIGH (ref 0–5)
HEMATOCRIT: 29.4 % — AB (ref 39.0–52.0)
HEMOGLOBIN: 9.5 g/dL — AB (ref 13.0–17.0)
Lymphocytes Relative: 33 % (ref 12–46)
Lymphs Abs: 1.9 10*3/uL (ref 0.7–4.0)
MCH: 30.1 pg (ref 26.0–34.0)
MCHC: 32.3 g/dL (ref 30.0–36.0)
MCV: 93 fL (ref 78.0–100.0)
MONO ABS: 0.4 10*3/uL (ref 0.1–1.0)
MONOS PCT: 7 % (ref 3–12)
NEUTROS ABS: 3 10*3/uL (ref 1.7–7.7)
Neutrophils Relative %: 52 % (ref 43–77)
Platelets: 221 10*3/uL (ref 150–400)
RBC: 3.16 MIL/uL — ABNORMAL LOW (ref 4.22–5.81)
RDW: 14.6 % (ref 11.5–15.5)
WBC: 5.7 10*3/uL (ref 4.0–10.5)

## 2014-04-18 LAB — COMPREHENSIVE METABOLIC PANEL
ALBUMIN: 3.4 g/dL — AB (ref 3.5–5.2)
ALT: 45 U/L (ref 0–53)
AST: 27 U/L (ref 0–37)
Alkaline Phosphatase: 49 U/L (ref 39–117)
BILIRUBIN TOTAL: 0.3 mg/dL (ref 0.3–1.2)
BUN: 82 mg/dL — AB (ref 6–23)
CHLORIDE: 95 meq/L — AB (ref 96–112)
CO2: 26 mEq/L (ref 19–32)
Calcium: 8.8 mg/dL (ref 8.4–10.5)
Creatinine, Ser: 13.1 mg/dL — ABNORMAL HIGH (ref 0.50–1.35)
GFR, EST AFRICAN AMERICAN: 5 mL/min — AB (ref >90)
GFR, EST NON AFRICAN AMERICAN: 4 mL/min — AB (ref >90)
GLUCOSE: 93 mg/dL (ref 70–99)
Potassium: 5.2 mEq/L (ref 3.7–5.3)
Sodium: 141 mEq/L (ref 137–147)
Total Protein: 7.4 g/dL (ref 6.0–8.3)

## 2014-04-18 LAB — GLUCOSE, CAPILLARY
Glucose-Capillary: 67 mg/dL — ABNORMAL LOW (ref 70–99)
Glucose-Capillary: 81 mg/dL (ref 70–99)

## 2014-04-18 LAB — PROTIME-INR
INR: 1.09 (ref 0.00–1.49)
PROTHROMBIN TIME: 13.9 s (ref 11.6–15.2)

## 2014-04-18 LAB — I-STAT TROPONIN, ED: TROPONIN I, POC: 0.28 ng/mL — AB (ref 0.00–0.08)

## 2014-04-18 LAB — LIPASE, BLOOD: LIPASE: 27 U/L (ref 11–59)

## 2014-04-18 MED ORDER — MORPHINE SULFATE 2 MG/ML IJ SOLN
2.0000 mg | INTRAMUSCULAR | Status: DC | PRN
  Administered 2014-04-18 – 2014-04-20 (×5): 2 mg via INTRAVENOUS
  Filled 2014-04-18 (×5): qty 1

## 2014-04-18 MED ORDER — ASPIRIN EC 325 MG PO TBEC
325.0000 mg | DELAYED_RELEASE_TABLET | Freq: Once | ORAL | Status: DC
  Filled 2014-04-18: qty 1

## 2014-04-18 MED ORDER — ACETAMINOPHEN 325 MG PO TABS
650.0000 mg | ORAL_TABLET | Freq: Four times a day (QID) | ORAL | Status: DC | PRN
  Administered 2014-04-21: 650 mg via ORAL
  Filled 2014-04-18: qty 2

## 2014-04-18 MED ORDER — ISOSORBIDE MONONITRATE ER 60 MG PO TB24
60.0000 mg | ORAL_TABLET | Freq: Every day | ORAL | Status: DC
  Administered 2014-04-19 – 2014-04-21 (×3): 60 mg via ORAL
  Filled 2014-04-18 (×3): qty 1

## 2014-04-18 MED ORDER — RENA-VITE PO TABS
1.0000 | ORAL_TABLET | Freq: Every day | ORAL | Status: DC
  Administered 2014-04-18 – 2014-04-20 (×3): 1 via ORAL
  Filled 2014-04-18 (×5): qty 1

## 2014-04-18 MED ORDER — ONDANSETRON HCL 4 MG/2ML IJ SOLN: 4.0000 mg | Freq: Three times a day (TID) | INTRAMUSCULAR | Status: AC | PRN

## 2014-04-18 MED ORDER — ONDANSETRON HCL 4 MG/2ML IJ SOLN
4.0000 mg | Freq: Once | INTRAMUSCULAR | Status: AC
  Administered 2014-04-18: 4 mg via INTRAVENOUS
  Filled 2014-04-18: qty 2

## 2014-04-18 MED ORDER — ROPINIROLE HCL 0.25 MG PO TABS
0.2500 mg | ORAL_TABLET | Freq: Every day | ORAL | Status: DC
  Administered 2014-04-18 – 2014-04-20 (×3): 0.25 mg via ORAL
  Filled 2014-04-18 (×4): qty 1

## 2014-04-18 MED ORDER — SODIUM CHLORIDE 0.9 % IJ SOLN: 3.0000 mL | INTRAMUSCULAR | Status: DC | PRN

## 2014-04-18 MED ORDER — DEXTROSE-NACL 5-0.45 % IV SOLN
INTRAVENOUS | Status: DC
Start: 2014-04-18 — End: 2014-04-21
  Administered 2014-04-18 – 2014-04-19 (×2): via INTRAVENOUS

## 2014-04-18 MED ORDER — SODIUM CHLORIDE 0.9 % IJ SOLN
3.0000 mL | Freq: Two times a day (BID) | INTRAMUSCULAR | Status: DC
  Administered 2014-04-18: 3 mL via INTRAVENOUS

## 2014-04-18 MED ORDER — TECHNETIUM TC 99M MEBROFENIN IV KIT
5.0000 | PACK | Freq: Once | INTRAVENOUS | Status: AC | PRN
  Administered 2014-04-18: 5 via INTRAVENOUS

## 2014-04-18 MED ORDER — SODIUM CHLORIDE 0.9 % IV SOLN: 250.0000 mL | INTRAVENOUS | Status: DC | PRN

## 2014-04-18 MED ORDER — ACETAMINOPHEN 650 MG RE SUPP: 650.0000 mg | Freq: Four times a day (QID) | RECTAL | Status: DC | PRN

## 2014-04-18 MED ORDER — HYDROMORPHONE HCL PF 1 MG/ML IJ SOLN
1.0000 mg | Freq: Once | INTRAMUSCULAR | Status: AC
  Administered 2014-04-18: 1 mg via INTRAVENOUS
  Filled 2014-04-18: qty 1

## 2014-04-18 MED ORDER — DOXERCALCIFEROL 4 MCG/2ML IV SOLN
1.0000 ug | INTRAVENOUS | Status: DC
  Administered 2014-04-19 – 2014-04-21 (×2): 1 ug via INTRAVENOUS
  Filled 2014-04-18 (×2): qty 2

## 2014-04-18 MED ORDER — HYDROMORPHONE HCL PF 1 MG/ML IJ SOLN
0.5000 mg | INTRAMUSCULAR | Status: AC | PRN
  Administered 2014-04-18: 0.5 mg via INTRAVENOUS
  Filled 2014-04-18: qty 1

## 2014-04-18 MED ORDER — SODIUM CHLORIDE 0.9 % IV SOLN: INTRAVENOUS | Status: DC

## 2014-04-18 MED ORDER — HEPARIN SODIUM (PORCINE) 5000 UNIT/ML IJ SOLN
5000.0000 [IU] | Freq: Three times a day (TID) | INTRAMUSCULAR | Status: DC
  Administered 2014-04-18 – 2014-04-21 (×7): 5000 [IU] via SUBCUTANEOUS
  Filled 2014-04-18 (×12): qty 1

## 2014-04-18 MED ORDER — PROMETHAZINE HCL 25 MG PO TABS
12.5000 mg | ORAL_TABLET | Freq: Four times a day (QID) | ORAL | Status: DC | PRN
  Filled 2014-04-18: qty 1

## 2014-04-18 MED ORDER — CARVEDILOL 25 MG PO TABS
25.0000 mg | ORAL_TABLET | Freq: Two times a day (BID) | ORAL | Status: DC
  Administered 2014-04-18 – 2014-04-21 (×5): 25 mg via ORAL
  Filled 2014-04-18 (×8): qty 1

## 2014-04-18 MED ORDER — CALCIUM ACETATE 667 MG PO CAPS
2001.0000 mg | ORAL_CAPSULE | Freq: Three times a day (TID) | ORAL | Status: DC
  Administered 2014-04-19 – 2014-04-21 (×4): 2001 mg via ORAL
  Filled 2014-04-18 (×11): qty 3

## 2014-04-18 MED ORDER — SODIUM CHLORIDE 0.9 % IV SOLN
62.5000 mg | INTRAVENOUS | Status: DC
  Administered 2014-04-19: 62.5 mg via INTRAVENOUS
  Filled 2014-04-18 (×2): qty 5

## 2014-04-18 MED ORDER — DARBEPOETIN ALFA-POLYSORBATE 25 MCG/0.42ML IJ SOLN
25.0000 ug | INTRAMUSCULAR | Status: DC
  Administered 2014-04-19: 25 ug via INTRAVENOUS
  Filled 2014-04-18: qty 0.42

## 2014-04-18 NOTE — ED Notes (Signed)
Informed pt. That we need an urine specimen.

## 2014-04-18 NOTE — ED Provider Notes (Signed)
CSN: [id number]     Arrival date & time 04/18/14  0516 History   First MD Initiated Contact with Patient 04/18/14 [phone]     Chief Complaint  Patient presents with  . Abdominal Pain  . Back Pain     (Consider location/radiation/quality/duration/timing/severity/associated sxs/prior Treatment) HPI Hollice Curnutt is a 48 y.o. male who presents to emergency department complaining of right upper quadrant abdominal pain that radiates around his flank to the right mid back. Patient states this pain began 4 days ago. States it is constant. States pain is worsened with coughing and deep breathing, as well as palpation of his right upper quadrant. Patient denies any associated nausea or vomiting. States eating does not make his pain worse. Patient is a hemodialysis patient, last dialysis yesterday, states did not finish a dialysis due to restless leg syndrome. He did not take any medications for this pain prior to coming in. He does still make urine but denies any urinary symptoms. He states about 2 months ago he was diagnosed with a left kidney mass, states was supposed to get MRI of his abdomen but did not receive his appointment time, due to miscommunication. He has a followup appointment in 2 days. Denies any prior abdominal surgeries.  Past Medical History  Diagnosis Date  . Hypertension   . CKD (chronic kidney disease) stage 4, GFR 15-29 ml/min   . Systolic CHF     Ef [id number]  . Medical non-compliance   . Polysubstance abuse   . ESRD (end stage renal disease)    Past Surgical History  Procedure Laterality Date  . Insertion of dialysis catheter  07/17/2012    Procedure: INSERTION OF DIALYSIS CATHETER;  Surgeon: Conrad [place], MD;  Location: [facility];  Service: Vascular;  Laterality: Right;  right internal jugular  . Bascilic vein transposition  01/19/2013    Procedure: BASCILIC VEIN TRANSPOSITION;  Surgeon: Conrad , MD;  Location: [facility];  Service: Vascular;  Laterality: Left;  left 2nd stage basilic  vein transposition   Family History  Problem Relation Age of Onset  . Hypertension Mother   . Hypertension Father    History  Substance Use Topics  . Smoking status: Former Smoker    Types: Cigarettes    Quit date: 08/09/2012  . Smokeless tobacco: Not on file     Comment: Trying to quit; 3 cigs/last 2 weeks  . Alcohol Use: 4.2 oz/week    7 Cans of beer per week     Comment: Beer and liquor. Unknown qty. Patient not forthcoming    Review of Systems  Constitutional: Negative for fever and chills.  Respiratory: Negative for cough, chest tightness and shortness of breath.   Cardiovascular: Negative for chest pain, palpitations and leg swelling.  Gastrointestinal: Positive for abdominal pain. Negative for nausea, vomiting, diarrhea and abdominal distention.  Genitourinary: Positive for flank pain. Negative for dysuria, urgency, frequency and hematuria.  Musculoskeletal: Negative for arthralgias, myalgias, neck pain and neck stiffness.  Skin: Negative for rash.  Allergic/Immunologic: Negative for immunocompromised state.  Neurological: Negative for dizziness, weakness, light-headedness, numbness and headaches.      Allergies  Review of patient's allergies indicates no known allergies.  Home Medications   Prior to Admission medications   Medication Sig Start Date End Date Taking? Authorizing Provider  carvedilol (COREG) 12.5 MG tablet Take 2 tablets (25 mg total) by mouth 2 (two) times daily with a meal. 03/09/14 10/25/15 Yes Oswald Hillock, MD  isosorbide mononitrate (IMDUR) 60  MG 24 hr tablet Take 60 mg by mouth daily. 07/21/12  Yes Orson Eva, MD  Multiple Vitamin (MULTIVITAMIN WITH MINERALS) TABS tablet Take 1 tablet by mouth daily.   Yes Historical Provider, MD   BP 157/103  Pulse 73  Temp(Src) 98.1 F (36.7 C) (Oral)  Ht 5\' 9"  (1.753 m)  Wt 170 lb (77.111 kg)  BMI 25.09 kg/m2  SpO2 100% Physical Exam  Nursing note and vitals reviewed. Constitutional: He is oriented to  person, place, and time. He appears well-developed and well-nourished. No distress.  HENT:  Head: Normocephalic and atraumatic.  Eyes: Conjunctivae are normal.  Neck: Neck supple.  Cardiovascular: Normal rate, regular rhythm and normal heart sounds.   Pulmonary/Chest: Effort normal. No respiratory distress. He has no wheezes. He has no rales.  Abdominal: Soft. Bowel sounds are normal. He exhibits no distension. There is tenderness. There is no rebound and no guarding.  Right upper quadrant tenderness  Musculoskeletal: He exhibits no edema.  Neurological: He is alert and oriented to person, place, and time.  Skin: Skin is warm and dry.    ED Course  Procedures (including critical care time) Labs Review Labs Reviewed  CBC WITH DIFFERENTIAL - Abnormal; Notable for the following:    RBC 3.16 (*)    Hemoglobin 9.5 (*)    HCT 29.4 (*)    Eosinophils Relative 7 (*)    All other components within normal limits  COMPREHENSIVE METABOLIC PANEL - Abnormal; Notable for the following:    Chloride 95 (*)    BUN 82 (*)    Creatinine, Ser 13.10 (*)    Albumin 3.4 (*)    GFR calc non Af Amer 4 (*)    GFR calc Af Amer 5 (*)    All other components within normal limits  I-STAT TROPOININ, ED - Abnormal; Notable for the following:    Troponin i, poc 0.28 (*)    All other components within normal limits  LIPASE, BLOOD  URINALYSIS, ROUTINE W REFLEX MICROSCOPIC    Imaging Review Dg Chest 2 View  04/18/2014   CLINICAL DATA:  Abdominal pain for 4 days.  Chronic renal failure.  EXAM: CHEST  2 VIEW  COMPARISON:  IR SHUNTOGRAM/ FISTULAGRAM *L* dated 03/09/2014; DG CHEST 1V PORT dated 07/17/2012  FINDINGS: The heart remains enlarged. Dialysis catheter noted previously has been removed in the interim. There is mild vascular congestion but no infiltrates or failure. Similar appearance to priors otherwise. No acute osseous findings.  IMPRESSION: Cardiomegaly.  No active infiltrates.  No overt CHF.    Electronically Signed   By: Rolla Flatten M.D.   On: 04/18/2014 08:15   Mr Abdomen Wo Contrast  04/18/2014   CLINICAL DATA:  Right upper quadrant abdominal pain. Left lower pole renal mass.  EXAM: MRI ABDOMEN WITHOUT CONTRAST  TECHNIQUE: Multiplanar multisequence MR imaging was performed without the administration of intravenous contrast.  COMPARISON:  Abdominal ultrasound dated 04/18/2012. CT abdomen pelvis dated 03/08/2014.  FINDINGS: Cardiomegaly.  Iron deposition in the liver and spleen.  Pancreas and adrenal glands are within normal limits.  Gallbladder wall thickening/edema (series 3/image 16). Known gallstone ultrasound is not evident on MRI. No intrahepatic or extrahepatic ductal dilatation.  Dominant 2.2 x 2.4 x 2.3 cm lesion in the anterior left lower kidney (series 3/image 14), with associated intrinsic T1 hyperintensity suggesting hemorrhage (series 7/image 36). This appearance favors a benign hemorrhagic cyst, although hemorrhage within a solid renal neoplasm remains possible.  Additional 1.7 x 1.7 x  1.6 cm lesion in the medial left upper pole (series 3/image 19), without hemorrhage, suspicious for solid renal neoplasm.  Numerous additional bilateral renal cysts of varying sizes and complexities, most of which likely reflect simple cysts, although incompletely characterized. No hydronephrosis.  No abdominal ascites.  No suspicious abdominal lymphadenopathy.  No focal osseous lesions.  IMPRESSION: 1.7 cm lesion in the medial left upper kidney, suspicious for solid renal neoplasm.  Additional 2.4 cm hemorrhagic lesion in the anterior left lower kidney, favored to reflect a benign hemorrhagic cyst, although solitary neoplasm is not excluded.  Gallbladder wall thickening/edema with known cholelithiasis, worrisome for acute cholecystitis. Consider hepatobiliary nuclear medicine scan for further evaluation as clinically warranted.  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   Electronically Signed   By: Julian Hy M.D.   On: 04/18/2014 17:27   Nm Hepatobiliary  04/18/2014   CLINICAL DATA:  Gallbladder wall thickening on ultrasound. Abdominal pain. Rule out cholecystitis.  EXAM: NUCLEAR MEDICINE HEPATOBILIARY IMAGING  TECHNIQUE: Sequential images of the abdomen were obtained out to 60 minutes following intravenous administration of radiopharmaceutical.  RADIOPHARMACEUTICALS:  5.0 mCi Tc-17m Choletec  COMPARISON:  Ultrasound 04/18/2014  FINDINGS: There is prompt uptake and excretion of radiotracer by the liver. The gallbladder fills. The biliary system empties into the small bowel.  IMPRESSION: No evidence of cystic duct or common bile duct obstruction.   Electronically Signed   By: Rolm Baptise M.D.   On: 04/18/2014 15:40   US Abdomen Complete  04/18/2014   CLINICAL DATA:  Abdominal pain.  EXAM: ULTRASOUND ABDOMEN COMPLETE  COMPARISON:  CT scan 03/08/2014 and renal ultrasound, same date.  FINDINGS: Gallbladder:  The gallbladder wall is thickened measuring up to 5 mm. No pericholecystic fluid. There is a 7 mm echogenic structure in the gallbladder neck. No acoustic shadowing is demonstrated. This could be an non shadowing gallstone or echogenic debris.  Common bile duct:  Diameter: 6.1 mm  Liver:  Normal echogenicity without focal lesion or biliary dilatation.  IVC:  Normal caliber.  Pancreas:  Sonographically unremarkable.  Spleen:  Size and appearance within normal limits.  Right Kidney:  Length: 7.0 cm. Increased echogenicity suggesting medical renal disease. There is a rounded 13 x 13 mm exophytic lesion involving the upper pole which could represent a complex cyst or solid nodule. A small upper pole cyst is also noted. This measures approximately 9 mm.  Left Kidney:  Length: 7.1 cm. Increased echogenicity suggesting medical renal disease. As demonstrated on the prior CT scan there is a 2.2 cm lesion projecting off the lower pole which could be a hemorrhagic cyst or mass.  Abdominal aorta:  No aneurysm  visualized.  Other findings:  None.  IMPRESSION: 1. Gallbladder wall thickening could be due to acute cholecystitis. 7 mm non shadowing gallstone versus echogenic debris in the gallbladder neck. Borderline common bile duct. Nuclear medicine hepatobiliary scan may be helpful for further evaluation. 2. Small kidneys with increased echogenicity consistent with end-stage renal disease. 3. Bilateral renal lesions are indeterminate. MR imaging is suggested (without contrast given renal function).   Electronically Signed   By: Kalman Jewels M.D.   On: 04/18/2014 08:32     EKG Interpretation None      MDM   Final diagnoses:  Cholecystitis  Anemia  Renal failure   Pt is dialysis pt, with RUQ pain and tenderness. Will get labs, [place] abd. Pain medications and anti emetics ordered.   9:23 AM Pt's US showing possible cholecystitis. Borderline  common bile duct. Pt's pain improved with iV dilaudid, but continues to have some pain. Pts LFTs and Lipase are normal. Spoke with surgery, will come see in ED. Asked for medical admission given multiple medical problems. '  Filed Vitals:   04/19/14 1215 04/19/14 1300 04/19/14 1722 04/19/14 2100  BP: 150/115 126/76 142/96 137/90  Pulse: 85 75 73 70  Temp: 98.9 F (37.2 C) 99.8 F (37.7 C) 98.8 F (37.1 C) 99.2 F (37.3 C)  TempSrc: Oral Oral Oral Oral  Resp: 18 17 16 16   Height:      Weight: 159 lb 2.8 oz (72.2 kg)   158 lb 11.7 oz (72 kg)  SpO2:  98% 100% 94%     Jawanza Zambito A Lakoda Mcanany, PA-C 04/20/14 0030

## 2014-04-18 NOTE — H&P (Signed)
[facility] History and Physical  [name] [name] [id number] DOB: 09-26-1966 DOA: 04/18/2014  Referring physician: ED  PCP: Pcp Not In System   Chief Complaint: Abdominal pain  HPI: [name] [name] is a 48 y.o. male    [name] [name] is a 48 y.o. Male with past medical history of end-stage renal disease on dialysis (T/Thu/S), hypertension, combined diastolic and systolic congestive heart failure (EF of 40-45% on 2-D echo 07/16/12), history of substance abuse, who presents to emergency department complaining of right upper quadrant abdominal pain for 4 days.   The patient reports that he has been having mild intermittent right upper quadrant abdominal pain in the past 6-8 months. He did not seek for treatment. Four days ago, his abdominal pain suddenly got worse when he was walking. It is located at right upper quadrant, constant, 8/10 in severity, non-radiating. It is aggravated by walking and not alleviated by any known factors. It is associated with some mild nausea, but without vomiting, diarrhea, fever or chills. Patient does not have chest pain, shortness of breath or palpitation. No symptoms for UTI. No hematuria.   Of note, patient was found to have a 2.9 cm left lower renal pole mass which is suspicious for renal cell carcinoma vs. Less likely benign neoplasm such as oncocytoma vs. less likely hyperdense cyst per radiologist. The patient was supposed to get MRI of his abdomen, but did not receive his appointment time yet.    Review of Systems:  Constitutional: not in acute distress No weight loss, night sweats, Fevers, chills. HEENT:  No headaches, Difficulty swallowing,Tooth/dental problems,Sore throat,  No sneezing, itching, ear ache, nasal congestion, post nasal drip,  Cardio-vascular:  No chest pain, Orthopnea, PND, swelling in lower extremities, anasarca, dizziness, palpitations  GI:  Has abdominal pain and nausea, but without vomiting, diarrhea, change in bowel  habits. Resp:  No shortness of breath with exertion or at rest. No excess mucus, no productive cough, No non-productive cough, No coughing up of blood. No change in color of mucus.No wheezing.No chest wall deformity  Skin:  no rash or lesions.  GU:  no dysuria, change in color of urine, no urgency or frequency. No flank pain.  Musculoskeletal:  No joint pain or swelling. No decreased range of motion. No back pain.  Psych:  No change in mood or affect. No depression or anxiety. No memory loss.   Past Medical History  Diagnosis Date  . Hypertension   . CKD (chronic kidney disease) stage 4, GFR 15-29 ml/min   . Systolic CHF     Ef [id number]  . Medical non-compliance   . Polysubstance abuse   . ESRD (end stage renal disease)    Past Surgical History  Procedure Laterality Date  . Insertion of dialysis catheter  07/17/2012    Procedure: INSERTION OF DIALYSIS CATHETER;  Surgeon: Conrad Aragon, MD;  Location: [facility];  Service: Vascular;  Laterality: Right;  right internal jugular  . Bascilic vein transposition  01/19/2013    Procedure: BASCILIC VEIN TRANSPOSITION;  Surgeon: Conrad Newburg, MD;  Location: [facility];  Service: Vascular;  Laterality: Left;  left 2nd stage basilic vein transposition   Social History:  reports that he has been smoking Cigarettes.  He has been smoking about 0.25 packs per day. He does not have any smokeless tobacco history on file. He reports that he drinks about 4.2 ounces of alcohol per week. He reports that he does not use illicit drugs.  No Known Allergies  Family  History  Problem Relation Age of Onset  . Hypertension Mother   . Diabetes Mother   . Hypertension Father      Prior to Admission medications   Medication Sig Start Date End Date Taking? Authorizing Provider  carvedilol (COREG) 12.5 MG tablet Take 2 tablets (25 mg total) by mouth 2 (two) times daily with a meal. 03/09/14 10/25/15 Yes Oswald Hillock, MD  isosorbide mononitrate (IMDUR) 60 MG 24 hr tablet Take  60 mg by mouth daily. 07/21/12  Yes Orson Eva, MD  Multiple Vitamin (MULTIVITAMIN WITH MINERALS) TABS tablet Take 1 tablet by mouth daily.   Yes Historical Provider, MD   Physical Exam: Filed Vitals:   04/18/14 1144  BP: 175/127  Pulse: 71  Temp: 97.9 F (36.6 C)  Resp: 16    BP 175/127  Pulse 71  Temp(Src) 97.9 F (36.6 C) (Oral)  Resp 16  Ht 5\' 9"  (1.753 m)  Wt 167 lb 12.3 oz (76.1 kg)  BMI 24.76 kg/m2  SpO2 100%  General:  Appears calm, no acute distress Eyes: PERRL, normal lids, irises & conjunctiva ENT: grossly normal hearing, lips & tongue Neck: no LAD, masses or thyromegaly Cardiovascular: RRR, no m/r/g. No LE edema. Telemetry: SR, no arrhythmias  Respiratory: CTA bilaterally, no w/r/r. Normal respiratory effort. Abdomen: soft, tender over RUQ, no guarding or rebound pain. Bowel sounds active Skin: no rash or induration seen on limited exam Musculoskeletal: grossly normal tone BUE/BLE Psychiatric: grossly normal mood and affect, speech fluent and appropriate Neurologic: grossly non-focal.          Labs on Admission:  Basic Metabolic Panel:  Recent Labs Lab 04/18/14 0550  NA 141  K 5.2  CL 95*  CO2 26  GLUCOSE 93  BUN 82*  CREATININE 13.10*  CALCIUM 8.8   Liver Function Tests:  Recent Labs Lab 04/18/14 0550  AST 27  ALT 45  ALKPHOS 49  BILITOT 0.3  PROT 7.4  ALBUMIN 3.4*    Recent Labs Lab 04/18/14 0550  LIPASE 27   No results found for this basename: AMMONIA,  in the last 168 hours CBC:  Recent Labs Lab 04/18/14 0550  WBC 5.7  NEUTROABS 3.0  HGB 9.5*  HCT 29.4*  MCV 93.0  PLT 221   Cardiac Enzymes:  Recent Labs Lab 04/18/14 0920  TROPONINI 0.39*    BNP (last 3 results) No results found for this basename: PROBNP,  in the last 8760 hours CBG: No results found for this basename: GLUCAP,  in the last 168 hours  Radiological Exams on Admission: Dg Chest 2 View  04/18/2014   CLINICAL DATA:  Abdominal pain for 4 days.   Chronic renal failure.  EXAM: CHEST  2 VIEW  COMPARISON:  IR SHUNTOGRAM/ FISTULAGRAM *L* dated 03/09/2014; DG CHEST 1V PORT dated 07/17/2012  FINDINGS: The heart remains enlarged. Dialysis catheter noted previously has been removed in the interim. There is mild vascular congestion but no infiltrates or failure. Similar appearance to priors otherwise. No acute osseous findings.  IMPRESSION: Cardiomegaly.  No active infiltrates.  No overt CHF.   Electronically Signed   By: Rolla Flatten M.D.   On: 04/18/2014 08:15   US Abdomen Complete  04/18/2014   CLINICAL DATA:  Abdominal pain.  EXAM: ULTRASOUND ABDOMEN COMPLETE  COMPARISON:  CT scan 03/08/2014 and renal ultrasound, same date.  FINDINGS: Gallbladder:  The gallbladder wall is thickened measuring up to 5 mm. No pericholecystic fluid. There is a 7 mm echogenic structure in  the gallbladder neck. No acoustic shadowing is demonstrated. This could be an non shadowing gallstone or echogenic debris.  Common bile duct:  Diameter: 6.1 mm  Liver:  Normal echogenicity without focal lesion or biliary dilatation.  IVC:  Normal caliber.  Pancreas:  Sonographically unremarkable.  Spleen:  Size and appearance within normal limits.  Right Kidney:  Length: 7.0 cm. Increased echogenicity suggesting medical renal disease. There is a rounded 13 x 13 mm exophytic lesion involving the upper pole which could represent a complex cyst or solid nodule. A small upper pole cyst is also noted. This measures approximately 9 mm.  Left Kidney:  Length: 7.1 cm. Increased echogenicity suggesting medical renal disease. As demonstrated on the prior CT scan there is a 2.2 cm lesion projecting off the lower pole which could be a hemorrhagic cyst or mass.  Abdominal aorta:  No aneurysm visualized.  Other findings:  None.  IMPRESSION: 1. Gallbladder wall thickening could be due to acute cholecystitis. 7 mm non shadowing gallstone versus echogenic debris in the gallbladder neck. Borderline common bile duct.  Nuclear medicine hepatobiliary scan may be helpful for further evaluation. 2. Small kidneys with increased echogenicity consistent with end-stage renal disease. 3. Bilateral renal lesions are indeterminate. MR imaging is suggested (without contrast given renal function).   Electronically Signed   By: Kalman Jewels M.D.   On: 04/18/2014 08:32    EKG: Sinus rhythm, regular, T-wave inversion in inferior leads and V5 to V6, ST slightly elevated in V3 to V5, left atrial enlargement, Q waves in aVL lead.   Assessment/Plan Principal Problem:   Cholecystitis Active Problems:   TOBACCO ABUSE   ESRD (end stage renal disease) on dialysis   Elevated troponin   HTN (hypertension)   Combined congestive systolic and diastolic heart failure   1. Abdominal pain: It is most likely caused by cholecystitis as evidenced on CT scan. Physical examination findings is consistent with cholecystitis. Other differential diagnoses include, but less likely, pancreatitis (lipase is normal), kidney stone (no typical abdominal pain for kidney stone, no hematuria, no CVA tenderness), gastroenteritis (no vomiting, diarrhea), hemorrhage from his kidney mass (the location of his kidney mass is on the left side).   Plan:  - NPO - Gentle IVF: NS 50 cc/h - Symptomatic treatment: Morphine when necessary for pain and Phenergan for nausea - Surgeon consulted, pending recommendation - cycle trop q6h given abnormal EKG and elevated troponin (currently patient does not have any chest pain). - pending UA - check UDS - HIDA  #: End-stage renal disease on dialysis (T/Thu/S):  - Patient had partial dialysis yesterday. He could not complete a full course of dialysis on 5/4 because of restless leg symptoms-->volume status seems fine.  - will consult to renal for HD  #: Hypertension: - Patient was on Coreg and Imdur at home, but was not compliant to medications. He has not taken medications in the past 3 days - restart home Coreg  and Indure  #: combined diastolic and systolic congestive heart failure (EF of 40-45% with grade 2 diastolic dysfuncion by 2-D echo 07/16/12): - Patient's volume status ok on admission - Volume manage per renal  #: Abnormal EKG and elevated troponin (0.39): - No chest pain or shortness of breath currently - Will give one dose of ASA - cycle trop q6h x 3 - repeat ekg today and then tomorrow morning--> will consult cardiology if develops chest pain or worsening troponin   Code Status: FULL CODE Family Communication: not yet,  no family member available.   Disposition Plan: 2 to 3 days   Time spent: 50 min  [facility] Pager [phone]-

## 2014-04-18 NOTE — ED Notes (Signed)
Pt states that there is a mass on his left kidney, and he states that this pain is similar.

## 2014-04-18 NOTE — ED Notes (Signed)
Pts. Oxygen levels decrease while sleeping 86% ,  placed pt. On Oxygen 2l [place] sats are 100%

## 2014-04-18 NOTE — Consult Note (Signed)
Indication for Consultation:  Management of ESRD/hemodialysis; anemia, hypertension/volume and secondary hyperparathyroidism  HPI: [name] [name] is a 48 y.o. male admitted for eval of abdominal pain. He has a history of HTN on HD MWF @ [place]. Last HD yesterday, is only able to run about 2.5 hours each tx before he signs off d/t restless leg syndrome. He reports a 4 days history of RLQ abdominal pain, describes as sharp and constant. No other associated symptoms. Has been tolerating po intake.  He was recently admitted 3/25-26 for Left flank pain, found to have a renal mass worrisome for RCC and hyperkalemia secondary to noncompliance with HD.   Past Medical History  Diagnosis Date  . Hypertension   . CKD (chronic kidney disease) stage 4, GFR 15-29 ml/min   . Systolic CHF     Ef [id number]  . Medical non-compliance   . Polysubstance abuse   . ESRD (end stage renal disease)    Past Surgical History  Procedure Laterality Date  . Insertion of dialysis catheter  07/17/2012    Procedure: INSERTION OF DIALYSIS CATHETER;  Surgeon: Conrad Grady, MD;  Location: [place];  Service: Vascular;  Laterality: Right;  right internal jugular  . Bascilic vein transposition  01/19/2013    Procedure: BASCILIC VEIN TRANSPOSITION;  Surgeon: Conrad , MD;  Location: [place];  Service: Vascular;  Laterality: Left;  left 2nd stage basilic vein transposition   Family History  Problem Relation Age of Onset  . Hypertension Mother   . Diabetes Mother   . Hypertension Father    Social History:  reports that he has been smoking Cigarettes.  He has been smoking about 0.25 packs per day. He does not have any smokeless tobacco history on file. He reports that he drinks about 4.2 ounces of alcohol per week. He reports that he does not use illicit drugs. Reports occasional cigarettes use. Had 2 beers yesterday. Denies illicit drug use No Known Allergies Prior to Admission medications   Medication Sig Start Date End Date Taking?  Authorizing Provider  carvedilol (COREG) 12.5 MG tablet Take 2 tablets (25 mg total) by mouth 2 (two) times daily with a meal. 03/09/14 10/25/15 Yes Oswald Hillock, MD  isosorbide mononitrate (IMDUR) 60 MG 24 hr tablet Take 60 mg by mouth daily. 07/21/12  Yes Orson Eva, MD  Multiple Vitamin (MULTIVITAMIN WITH MINERALS) TABS tablet Take 1 tablet by mouth daily.   Yes Historical Provider, MD   Current Facility-Administered Medications  Medication Dose Route Frequency Provider Last Rate Last Dose  . acetaminophen (TYLENOL) tablet 650 mg  650 mg Oral Q6H PRN Ivor Costa, MD       Or  . acetaminophen (TYLENOL) suppository 650 mg  650 mg Rectal Q6H PRN Ivor Costa, MD      . aspirin EC tablet 325 mg  325 mg Oral Once Ivor Costa, MD      . carvedilol (COREG) tablet 25 mg  25 mg Oral BID WC Ivor Costa, MD      . dextrose 5 %-0.45 % sodium chloride infusion   Intravenous Continuous Charlynne Cousins, MD 50 mL/hr at 04/18/14 1221    . heparin injection 5,000 Units  5,000 Units Subcutaneous 3 times per day Ivor Costa, MD      . HYDROmorphone (DILAUDID) injection 0.5 mg  0.5 mg Intravenous Q4H PRN Tatyana A Kirichenko, PA-C      . [START ON 04/19/2014] isosorbide mononitrate (IMDUR) 24 hr tablet 60 mg  60 mg  Oral Daily Ivor Costa, MD      . morphine 2 MG/ML injection 2 mg  2 mg Intravenous Q3H PRN Ivor Costa, MD      . ondansetron [facility]) injection 4 mg  4 mg Intravenous Q8H PRN Tatyana A Kirichenko, PA-C      . promethazine (PHENERGAN) tablet 12.5 mg  12.5 mg Oral Q6H PRN Ivor Costa, MD       Labs: Basic Metabolic Panel:  Recent Labs Lab 04/18/14 0550  NA 141  K 5.2  CL 95*  CO2 26  GLUCOSE 93  BUN 82*  CREATININE 13.10*  CALCIUM 8.8   Liver Function Tests:  Recent Labs Lab 04/18/14 0550  AST 27  ALT 45  ALKPHOS 49  BILITOT 0.3  PROT 7.4  ALBUMIN 3.4*    Recent Labs Lab 04/18/14 0550  LIPASE 27   No results found for this basename: AMMONIA,  in the last 168 hours CBC:  Recent Labs Lab  04/18/14 0550  WBC 5.7  NEUTROABS 3.0  HGB 9.5*  HCT 29.4*  MCV 93.0  PLT 221   Cardiac Enzymes:  Recent Labs Lab 04/18/14 0920  TROPONINI 0.39*   CBG:  Recent Labs Lab 04/18/14 1158  GLUCAP 67*   Iron Studies: No results found for this basename: IRON, TIBC, TRANSFERRIN, FERRITIN,  in the last 72 hours Studies/Results: Dg Chest 2 View  04/18/2014   CLINICAL DATA:  Abdominal pain for 4 days.  Chronic renal failure.  EXAM: CHEST  2 VIEW  COMPARISON:  IR SHUNTOGRAM/ FISTULAGRAM *L* dated 03/09/2014; DG CHEST 1V PORT dated 07/17/2012  FINDINGS: The heart remains enlarged. Dialysis catheter noted previously has been removed in the interim. There is mild vascular congestion but no infiltrates or failure. Similar appearance to priors otherwise. No acute osseous findings.  IMPRESSION: Cardiomegaly.  No active infiltrates.  No overt CHF.   Electronically Signed   By: Rolla Flatten M.D.   On: 04/18/2014 08:15   US Abdomen Complete  04/18/2014   CLINICAL DATA:  Abdominal pain.  EXAM: ULTRASOUND ABDOMEN COMPLETE  COMPARISON:  CT scan 03/08/2014 and renal ultrasound, same date.  FINDINGS: Gallbladder:  The gallbladder wall is thickened measuring up to 5 mm. No pericholecystic fluid. There is a 7 mm echogenic structure in the gallbladder neck. No acoustic shadowing is demonstrated. This could be an non shadowing gallstone or echogenic debris.  Common bile duct:  Diameter: 6.1 mm  Liver:  Normal echogenicity without focal lesion or biliary dilatation.  IVC:  Normal caliber.  Pancreas:  Sonographically unremarkable.  Spleen:  Size and appearance within normal limits.  Right Kidney:  Length: 7.0 cm. Increased echogenicity suggesting medical renal disease. There is a rounded 13 x 13 mm exophytic lesion involving the upper pole which could represent a complex cyst or solid nodule. A small upper pole cyst is also noted. This measures approximately 9 mm.  Left Kidney:  Length: 7.1 cm. Increased echogenicity  suggesting medical renal disease. As demonstrated on the prior CT scan there is a 2.2 cm lesion projecting off the lower pole which could be a hemorrhagic cyst or mass.  Abdominal aorta:  No aneurysm visualized.  Other findings:  None.  IMPRESSION: 1. Gallbladder wall thickening could be due to acute cholecystitis. 7 mm non shadowing gallstone versus echogenic debris in the gallbladder neck. Borderline common bile duct. Nuclear medicine hepatobiliary scan may be helpful for further evaluation. 2. Small kidneys with increased echogenicity consistent with end-stage renal disease. 3. Bilateral  renal lesions are indeterminate. MR imaging is suggested (without contrast given renal function).   Electronically Signed   By: Kalman Jewels M.D.   On: 04/18/2014 08:32    Review of Systems: Gen: Denies any fever, chills, sweats, anorexia, fatigue, weakness, malaise, weight loss, reports poor sleep d/t restless leg HEENT: No visual complaints, No history of Retinopathy. Normal external appearance No Epistaxis or Sore throat. No sinusitis.   CV: Denies chest pain, angina, palpitations, syncope, orthopnea, PND, peripheral edema, and claudication. Resp: Reports dyspnea with exercise. Denies dyspnea at rest,  cough, sputum, wheezing, coughing up blood, and pleurisy. GI: Denies vomiting blood, jaundice, and fecal incontinence.   Denies dysphagia or odynophagia. Reports RLQ abd pain. GU : Denies urinary burning, blood in urine, urinary frequency, urinary hesitancy, nocturnal urination, and urinary incontinence.  No renal calculi. MS: Denies joint pain, limitation of movement, and swelling, stiffness, low back pain, extremity pain. Denies muscle weakness, cramps, atrophy.  No use of non steroidal antiinflammatory drugs. Derm: Denies rash, itching, dry skin, hives, moles, warts, or unhealing ulcers.  Psych: Denies depression, anxiety, memory loss, suicidal ideation, hallucinations, paranoia, and confusion. Heme: Denies  bruising, bleeding, and enlarged lymph nodes. Neuro: No headache.  No diplopia. No dysarthria.  No dysphasia.  No history of CVA.  No Seizures. No paresthesias.  No weakness. Reports restless leg  Endocrine No DM.  No Thyroid disease.  No Adrenal disease.  Physical Exam: Filed Vitals:   04/18/14 1030 04/18/14 1045 04/18/14 1100 04/18/14 1144  BP: 161/102 171/114 174/113 175/127  Pulse: 72 72 72 71  Temp:    97.9 F (36.6 C)  TempSrc:    Oral  Resp:    16  Height:      Weight:    76.1 kg (167 lb 12.3 oz)  SpO2: 100% 100% 100% 100%     General: Well developed, well nourished, in no acute distress.  Head: Normocephalic, atraumatic, sclera non-icteric, mucus membranes are moist Neck: Supple. JVD not elevated. Lungs: Clear bilaterally to auscultation without wheezes, rales, or rhonchi. Breathing is unlabored. Heart: RRR with S1 S2. No murmurs, rubs, or gallops appreciated. Abdomen: Soft, mild tenderness RLQ,  non-distended with normoactive bowel sounds. No rebound/guarding. No obvious abdominal masses. M-S:  Strength and tone appear normal for age. Lower extremities:without edema or ischemic changes, no open wounds  Neuro: Alert and oriented X 3. Moves all extremities spontaneously. Psych:  Responds to questions appropriately with a normal affect. Dialysis Access: L AVF +bruit/thrill  Dialysis Orders:  MWF @ [place] 70.5kg    4hr 160   400/1.5  2K /2.5Ca LAVF  Heparin 5000 hectorol 1 mcg IV/HD Epogen 1200 Units IV/HD  Venofer 50 week   Assessment/Plan: 1. Abdominal pain- likely cholecystitis per abd [place]. Surgery consulted. HIDA pending. WBC 5.7 2. Elevated troponin- without chestpain. Management per primary 3. Renal mass- ? RCC, outpt followup next week 4.  ESRD -  MWF @ [place], noncompliant with running full tx. K+ 5.2 ~6kg over EDW, but never reaches. HD pending ?OR 5. Restless leg- limits HD, lorazepam outpt pre HD 6.  Hypertension/volume  - SBP 170s. noncomplaint with medications.  Cont home coreg and imdur 7.  Anemia  - hgb 9.5 cont ESA and Fe 8.  Metabolic bone disease -  Ca+ 8.8. phos 11.1 PTH 291 (from 4/22) phoslo 3 TID when diet. Cont hectorol 9.  Nutrition - alb 3.4. Currently NPO. Renal diet and multivit when advanced.  Shelle Iron, NP [facility]  Beeper [id number] 04/18/2014, 12:54 PM   Pt seen, examined and agree w A/P as above. Mr Broderson has been on HD for about two years. He is here w abd pain and is to have a HIDA scan to evaluate for acute cholecysititis.  The cause of ESRD was HTN.  According to patient he did well initially with HD for a few months , but since then has not been able to stay on the machine due to severe problems with RLS.  This gets worse during HD and he has to sign off at about 2 - 2.5 hours every treatment.  He has never been prescribed RLS meds but has received other treatments such as Ativan and Benadryl to try and get him thru his treatments but they haven't helped.  He does not have pain or numbness, just "restlessness" which causes him to sign off. We will need to try and address this while he is here - he is grossly uremic with uremic fetor.  Will start him on Requip and titrate as needed, plan extra HD while here as well with sedation as tolerated to try and get solute load down.  Will follow.  Kelly Splinter MD pager [phone]    cell [phone] 04/18/2014, 6:36 PM

## 2014-04-18 NOTE — Consult Note (Addendum)
Reason for Consult: abdominal pain Referring Physician: Ezekiel Ina, PA-C   HPI: [name] [name] is a 48 year old male with a history of HTN, ESRD(dialysis MWF), substance abuse, systolic heart failure who presents with RUQ abdominal pain.  Duration of symptoms is 4 days.  Onset was sudden.  Coarse is unchanged.  Moderate in severity.  Characterized as sharp pain.  Time pattern is constant.  Location is RUQ and without radiation.  Associating factors; none.  Denies previous symptoms.  No modifying factors.  No aggravating factors.  Alleviated with pain medication.  On evaluation today, LFTs are normal, white count is normal.  [place] of abdomen gallbladder wall thickening, cholelithiasis.  We have been asked to evaluate the patient for possible acute cholecystitis.  He denies previous abdominal surgeries.  Denies any recent use of illicit drugs.  Last oral intake was last night.     Past Medical History  Diagnosis Date  . Hypertension   . CKD (chronic kidney disease) stage 4, GFR 15-29 ml/min   . Systolic CHF     Ef 52-77%  . Medical non-compliance   . Polysubstance abuse   . ESRD (end stage renal disease)     Past Surgical History  Procedure Laterality Date  . Insertion of dialysis catheter  07/17/2012    Procedure: INSERTION OF DIALYSIS CATHETER;  Surgeon: Conrad [place], MD;  Location: [place];  Service: Vascular;  Laterality: Right;  right internal jugular  . Bascilic vein transposition  01/19/2013    Procedure: BASCILIC VEIN TRANSPOSITION;  Surgeon: Conrad [place], MD;  Location: [place];  Service: Vascular;  Laterality: Left;  left 2nd stage basilic vein transposition    Family History  Problem Relation Age of Onset  . Hypertension Mother   . Hypertension Father     Social History:  reports that he quit smoking about 20 months ago. His smoking use included Cigarettes. He smoked 0.00 packs per day. He does not have any smokeless tobacco history on file. He reports that he drinks about 4.2  ounces of alcohol per week. He reports that he uses illicit drugs (Marijuana).  Allergies: No Known Allergies  Home Medications:  Carvedilol 12.14m BID Imdur 677mdaily MVI  Results for orders placed during the hospital encounter of 04/18/14 (from the past 48 hour(s))  CBC WITH DIFFERENTIAL     Status: Abnormal   Collection Time    04/18/14  5:50 AM      Result Value Ref Range   WBC 5.7  4.0 - 10.5 K/uL   RBC 3.16 (*) 4.22 - 5.81 MIL/uL   Hemoglobin 9.5 (*) 13.0 - 17.0 g/dL   HCT 29.4 (*) 39.0 - 52.0 %   MCV 93.0  78.0 - 100.0 fL   MCH 30.1  26.0 - 34.0 pg   MCHC 32.3  30.0 - 36.0 g/dL   RDW 14.6  11.5 - 15.5 %   Platelets 221  150 - 400 K/uL   Neutrophils Relative % 52  43 - 77 %   Neutro Abs 3.0  1.7 - 7.7 K/uL   Lymphocytes Relative 33  12 - 46 %   Lymphs Abs 1.9  0.7 - 4.0 K/uL   Monocytes Relative 7  3 - 12 %   Monocytes Absolute 0.4  0.1 - 1.0 K/uL   Eosinophils Relative 7 (*) 0 - 5 %   Eosinophils Absolute 0.4  0.0 - 0.7 K/uL   Basophils Relative 1  0 - 1 %  Basophils Absolute 0.0  0.0 - 0.1 K/uL  COMPREHENSIVE METABOLIC PANEL     Status: Abnormal   Collection Time    04/18/14  5:50 AM      Result Value Ref Range   Sodium 141  137 - 147 mEq/L   Potassium 5.2  3.7 - 5.3 mEq/L   Chloride 95 (*) 96 - 112 mEq/L   CO2 26  19 - 32 mEq/L   Glucose, Bld 93  70 - 99 mg/dL   BUN 82 (*) 6 - 23 mg/dL   Creatinine, Ser 13.10 (*) 0.50 - 1.35 mg/dL   Calcium 8.8  8.4 - 10.5 mg/dL   Total Protein 7.4  6.0 - 8.3 g/dL   Albumin 3.4 (*) 3.5 - 5.2 g/dL   AST 27  0 - 37 U/L   ALT 45  0 - 53 U/L   Alkaline Phosphatase 49  39 - 117 U/L   Total Bilirubin 0.3  0.3 - 1.2 mg/dL   GFR calc non Af Amer 4 (*) >90 mL/min   GFR calc Af Amer 5 (*) >90 mL/min   Comment: (NOTE)     The eGFR has been calculated using the CKD EPI equation.     This calculation has not been validated in all clinical situations.     eGFR's persistently <90 mL/min signify possible Chronic Kidney     Disease.   LIPASE, BLOOD     Status: None   Collection Time    04/18/14  5:50 AM      Result Value Ref Range   Lipase 27  11 - 59 U/L  I-STAT TROPOININ, ED     Status: Abnormal   Collection Time    04/18/14  5:52 AM      Result Value Ref Range   Troponin i, poc 0.28 (*) 0.00 - 0.08 ng/mL   Comment NOTIFIED PHYSICIAN     Comment 3            Comment: Due to the release kinetics of cTnI,     a negative result within the first hours     of the onset of symptoms does not rule out     myocardial infarction with certainty.     If myocardial infarction is still suspected,     repeat the test at appropriate intervals.  TROPONIN I     Status: Abnormal   Collection Time    04/18/14  9:20 AM      Result Value Ref Range   Troponin I 0.39 (*) <0.30 ng/mL   Comment:            Due to the release kinetics of cTnI,     a negative result within the first hours     of the onset of symptoms does not rule out     myocardial infarction with certainty.     If myocardial infarction is still suspected,     repeat the test at appropriate intervals.     CRITICAL RESULT CALLED TO, READ BACK BY AND VERIFIED WITH:     L.BERDIK,RN 04/18/14 0956 BY BSLADE    Dg Chest 2 View  04/18/2014   CLINICAL DATA:  Abdominal pain for 4 days.  Chronic renal failure.  EXAM: CHEST  2 VIEW  COMPARISON:  IR SHUNTOGRAM/ FISTULAGRAM *L* dated 03/09/2014; DG CHEST 1V PORT dated 07/17/2012  FINDINGS: The heart remains enlarged. Dialysis catheter noted previously has been removed in the interim. There is mild vascular congestion but  no infiltrates or failure. Similar appearance to priors otherwise. No acute osseous findings.  IMPRESSION: Cardiomegaly.  No active infiltrates.  No overt CHF.   Electronically Signed   By: Rolla Flatten M.D.   On: 04/18/2014 08:15   US Abdomen Complete  04/18/2014   CLINICAL DATA:  Abdominal pain.  EXAM: ULTRASOUND ABDOMEN COMPLETE  COMPARISON:  CT scan 03/08/2014 and renal ultrasound, same date.  FINDINGS:  Gallbladder:  The gallbladder wall is thickened measuring up to 5 mm. No pericholecystic fluid. There is a 7 mm echogenic structure in the gallbladder neck. No acoustic shadowing is demonstrated. This could be an non shadowing gallstone or echogenic debris.  Common bile duct:  Diameter: 6.1 mm  Liver:  Normal echogenicity without focal lesion or biliary dilatation.  IVC:  Normal caliber.  Pancreas:  Sonographically unremarkable.  Spleen:  Size and appearance within normal limits.  Right Kidney:  Length: 7.0 cm. Increased echogenicity suggesting medical renal disease. There is a rounded 13 x 13 mm exophytic lesion involving the upper pole which could represent a complex cyst or solid nodule. A small upper pole cyst is also noted. This measures approximately 9 mm.  Left Kidney:  Length: 7.1 cm. Increased echogenicity suggesting medical renal disease. As demonstrated on the prior CT scan there is a 2.2 cm lesion projecting off the lower pole which could be a hemorrhagic cyst or mass.  Abdominal aorta:  No aneurysm visualized.  Other findings:  None.  IMPRESSION: 1. Gallbladder wall thickening could be due to acute cholecystitis. 7 mm non shadowing gallstone versus echogenic debris in the gallbladder neck. Borderline common bile duct. Nuclear medicine hepatobiliary scan may be helpful for further evaluation. 2. Small kidneys with increased echogenicity consistent with end-stage renal disease. 3. Bilateral renal lesions are indeterminate. MR imaging is suggested (without contrast given renal function).   Electronically Signed   By: Kalman Jewels M.D.   On: 04/18/2014 08:32    Review of Systems  Constitutional: Negative for fever, chills, weight loss, malaise/fatigue and diaphoresis.  Respiratory: Negative for cough, shortness of breath and wheezing.   Cardiovascular: Negative for chest pain, palpitations and leg swelling.  Gastrointestinal: Positive for abdominal pain. Negative for heartburn, nausea, vomiting,  diarrhea, constipation, blood in stool and melena.  Musculoskeletal: Positive for back pain. Negative for joint pain and myalgias.  Neurological: Negative for dizziness, tingling, tremors, sensory change, speech change, focal weakness, seizures, loss of consciousness and weakness.  Psychiatric/Behavioral: Positive for substance abuse.   Blood pressure 157/111, pulse 74, temperature 98.1 F (36.7 C), temperature source Oral, resp. rate 12, height _0  (1.753 m), weight 170 lb (77.111 kg), SpO2 100.00%. Physical Exam  Constitutional: He is oriented to person, place, and time. He appears well-developed and well-nourished. No distress.  HENT:  Head: Normocephalic and atraumatic.  Eyes: No scleral icterus.  Neck: Normal range of motion. Neck supple.  Cardiovascular: Normal rate, regular rhythm, normal heart sounds and intact distal pulses.  Exam reveals no gallop and no friction rub.   No murmur heard. Respiratory: Effort normal and breath sounds normal. No respiratory distress. He has no wheezes. He has no rales. He exhibits no tenderness.  GI: Soft. Bowel sounds are normal. He exhibits no distension and no mass. There is no guarding.  TTP to RUQ  Musculoskeletal: Normal range of motion. He exhibits no edema and no tenderness.  Neurological: He is oriented to person, place, and time.  Skin: Skin is warm and dry. No rash noted. He  is not diaphoretic. No erythema. No pallor.  Left arm AV graft  Psychiatric: He has a normal mood and affect. His behavior is normal. Judgment and thought content normal.    Assessment/Plan: Hx polysubstance abuse HTN ESRD Symptomatic cholelithiasis, possible early cholecystitis  -admit to medicine service -NPO for now, IVF -pain control -HIDA scan versus a lap chole with IOC, Dr. Lucia Gaskins to evaluate.    Left lower renal pole mass (2.9 cm) Smokes (1/2 ppd) Mild elevation of Troponin levels (0.399 on 04/18/2014) Poorly controlled HTN  Emina Riebock  ANP-BC 04/18/2014, 10:34 AM   Agree with above. [place] was equivocal - there is gall bladder wall thickening, there may be a gall stone.  Dr. Blaine Hamper, in his note, mentions a "most likely caused by cholecystitis as evidenced on CT scan", but the patient had an [place].  The CT scan is remote.  There is a 2.9 cm left lower pole renal mass seen on CT scan 03/08/2014 which is being worked up.  He is supposed to get a MRI next week.  I talked to the patient about the potential diagnoses and possible surgery, but he is very anxious, and does not want to thing about surgery today. I spoke to his mother on the phone [phone]) and explained her son's diagnosis. He is getting a HIDA scan, which I think will help the patient make a decision about surgery.  HIDA scan was negative.  He does not have cholecystitis.  If pain continues, would consider GI evaluation.  Both his parent were in the room when I went over the results with him.  MRI results of left renal mass is pending.  Alphonsa Overall, MD, [facility] Pager: [phone] Office phone:  [phone]

## 2014-04-18 NOTE — ED Notes (Signed)
I stat troponin results given to Dr. Florina Ou by B. Yolanda Bonine, EMT

## 2014-04-18 NOTE — Progress Notes (Signed)
Pt c/o of wanting to eat. Stated that he was told that he would not be having surgery. Call to surgeon. Orders for clear liquids then NPO after midnight. Pt advised of diet. Continue to monitor.

## 2014-04-18 NOTE — ED Notes (Signed)
Pt. reports RUQ and right lower back pain worse with deep inspiration onset 4 days ago , pt. did not complete his hemodialysis yesterday .

## 2014-04-19 DIAGNOSIS — D649 Anemia, unspecified: Secondary | ICD-10-CM

## 2014-04-19 DIAGNOSIS — I120 Hypertensive chronic kidney disease with stage 5 chronic kidney disease or end stage renal disease: Secondary | ICD-10-CM | POA: Diagnosis not present

## 2014-04-19 DIAGNOSIS — R799 Abnormal finding of blood chemistry, unspecified: Secondary | ICD-10-CM

## 2014-04-19 DIAGNOSIS — K8010 Calculus of gallbladder with chronic cholecystitis without obstruction: Secondary | ICD-10-CM | POA: Diagnosis not present

## 2014-04-19 DIAGNOSIS — N2581 Secondary hyperparathyroidism of renal origin: Secondary | ICD-10-CM | POA: Diagnosis not present

## 2014-04-19 DIAGNOSIS — R109 Unspecified abdominal pain: Secondary | ICD-10-CM

## 2014-04-19 DIAGNOSIS — K8020 Calculus of gallbladder without cholecystitis without obstruction: Secondary | ICD-10-CM | POA: Diagnosis not present

## 2014-04-19 DIAGNOSIS — E875 Hyperkalemia: Secondary | ICD-10-CM

## 2014-04-19 DIAGNOSIS — N186 End stage renal disease: Secondary | ICD-10-CM | POA: Diagnosis not present

## 2014-04-19 DIAGNOSIS — I10 Essential (primary) hypertension: Secondary | ICD-10-CM

## 2014-04-19 DIAGNOSIS — I5040 Unspecified combined systolic (congestive) and diastolic (congestive) heart failure: Secondary | ICD-10-CM

## 2014-04-19 DIAGNOSIS — F1410 Cocaine abuse, uncomplicated: Secondary | ICD-10-CM

## 2014-04-19 DIAGNOSIS — D631 Anemia in chronic kidney disease: Secondary | ICD-10-CM | POA: Diagnosis not present

## 2014-04-19 DIAGNOSIS — I5042 Chronic combined systolic (congestive) and diastolic (congestive) heart failure: Secondary | ICD-10-CM | POA: Diagnosis not present

## 2014-04-19 DIAGNOSIS — R112 Nausea with vomiting, unspecified: Secondary | ICD-10-CM

## 2014-04-19 LAB — URINALYSIS, ROUTINE W REFLEX MICROSCOPIC
BILIRUBIN URINE: NEGATIVE
GLUCOSE, UA: NEGATIVE mg/dL
KETONES UR: NEGATIVE mg/dL
Nitrite: NEGATIVE
PROTEIN: 100 mg/dL — AB
Specific Gravity, Urine: 1.012 (ref 1.005–1.030)
Urobilinogen, UA: 0.2 mg/dL (ref 0.0–1.0)
pH: 7 (ref 5.0–8.0)

## 2014-04-19 LAB — CBC
HCT: 29.3 % — ABNORMAL LOW (ref 39.0–52.0)
HEMOGLOBIN: 9.7 g/dL — AB (ref 13.0–17.0)
MCH: 30.6 pg (ref 26.0–34.0)
MCHC: 33.1 g/dL (ref 30.0–36.0)
MCV: 92.4 fL (ref 78.0–100.0)
Platelets: 223 10*3/uL (ref 150–400)
RBC: 3.17 MIL/uL — AB (ref 4.22–5.81)
RDW: 14.5 % (ref 11.5–15.5)
WBC: 6.1 10*3/uL (ref 4.0–10.5)

## 2014-04-19 LAB — BASIC METABOLIC PANEL
BUN: 98 mg/dL — ABNORMAL HIGH (ref 6–23)
CHLORIDE: 94 meq/L — AB (ref 96–112)
CO2: 19 mEq/L (ref 19–32)
Calcium: 7.7 mg/dL — ABNORMAL LOW (ref 8.4–10.5)
Creatinine, Ser: 15.13 mg/dL — ABNORMAL HIGH (ref 0.50–1.35)
GFR calc Af Amer: 4 mL/min — ABNORMAL LOW (ref >90)
GFR calc non Af Amer: 3 mL/min — ABNORMAL LOW (ref >90)
GLUCOSE: 108 mg/dL — AB (ref 70–99)
Potassium: 6.7 mEq/L (ref 3.7–5.3)
SODIUM: 136 meq/L — AB (ref 137–147)

## 2014-04-19 LAB — URINE MICROSCOPIC-ADD ON

## 2014-04-19 LAB — POTASSIUM: Potassium: 3.2 mEq/L — ABNORMAL LOW (ref 3.7–5.3)

## 2014-04-19 LAB — RAPID URINE DRUG SCREEN, HOSP PERFORMED
Amphetamines: NOT DETECTED
Barbiturates: NOT DETECTED
Benzodiazepines: NOT DETECTED
Cocaine: POSITIVE — AB
OPIATES: NOT DETECTED
Tetrahydrocannabinol: NOT DETECTED

## 2014-04-19 LAB — GLUCOSE, CAPILLARY: Glucose-Capillary: 72 mg/dL (ref 70–99)

## 2014-04-19 LAB — TROPONIN I

## 2014-04-19 MED ORDER — DOXERCALCIFEROL 4 MCG/2ML IV SOLN
INTRAVENOUS | Status: AC
  Administered 2014-04-19: 1 ug via INTRAVENOUS
  Filled 2014-04-19: qty 2

## 2014-04-19 MED ORDER — DARBEPOETIN ALFA-POLYSORBATE 25 MCG/0.42ML IJ SOLN
INTRAMUSCULAR | Status: AC
  Filled 2014-04-19: qty 0.42

## 2014-04-19 NOTE — Progress Notes (Signed)
[facility]                                                                              Patient Demographics  [name] [name], is a 48 y.o. male, DOB - 03/01/1966, [id number]  Admit date - 04/18/2014   Admitting Physician Charlynne Cousins, MD  Outpatient Primary MD for the patient is Pcp Not In System  LOS - 1   Chief Complaint  Patient presents with  . Abdominal Pain  . Back Pain        Assessment & Plan   Abdominal pain likely due to symptomatic cholelithiasis -Ultrasound the abdomen: Gallbladder wall thickening could be due to acute cholecystitis. 7 mm non shadowing gallstone versus echogenic debris in the gallbladder neck -MRI 1.7 cm lesion in the medial left upper kidney, suspicious for solid renal neoplasm, Gallbladder wall thickening/edema with known cholelithiasis, worrisome for acute cholecystitis. 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 -General surgery consulted and appreciated, recommended cholecystectomy, however not emergently.  End-stage renal disease requiring hemodialysis -Patient dialyzes on Tuesday Thursday and Saturday -Patient partially dialyzed on Tuesday due to restless leg symptoms, dialyzed today -Nephrology consulted and appreciated  Hypertension -Continue Coreg and Imdur  -Patient has had a history of noncompliance with medications  Combined diastolic and systolic congestive heart failure -Echocardiogram 07/16/2012 shows an EF of A999333, grade 2 diastolic dysfunction -Continue daily weights, strict intake and output -Currently appears compensated  Abnormal EKG with elevated troponin -Currently no chest pain or shortness of breath -Continue aspirin -EKG: Normal sinus rhythm -Initial Troponin 0.39, has trended downward to 0.3  Polysubstance abuse -Toxicology screen positive for cocaine -Patient counseled  Restless leg syndrome -Continue Requip  Anemia of  chronic disease -Continue Aranesp, ferric gluconate -Continue to monitor CBC  Hyperkalemia -Resolved, Likely due to missed dialysis, currently 3.2  Code Status: Full  Family Communication: None at bedside  Disposition Plan: Admitted  Time Spent in minutes   30 minutes  Procedures  HIDA  Consults   Nephrology General surgery  DVT Prophylaxis  Heparin  Lab Results  Component Value Date   PLT 223 04/19/2014    Medications  Scheduled Meds: . aspirin EC  325 mg Oral Once  . calcium acetate  2,001 mg Oral TID WC  . carvedilol  25 mg Oral BID WC  . darbepoetin      . darbepoetin (ARANESP) injection - DIALYSIS  25 mcg Intravenous Q Wed-HD  . doxercalciferol  1 mcg Intravenous Q M,W,F-HD  . ferric gluconate (FERRLECIT/NULECIT) IV  62.5 mg Intravenous Weekly  . heparin  5,000 Units Subcutaneous 3 times per day  . isosorbide mononitrate  60 mg Oral Daily  . multivitamin  1 tablet Oral QHS  . rOPINIRole  0.25 mg Oral QHS   Continuous Infusions: . dextrose 5 % and 0.45% NaCl 50 mL/hr at 04/18/14 1221   PRN Meds:.acetaminophen, acetaminophen, morphine injection, promethazine  Antibiotics    Anti-infectives   None      Subjective:   [name] [name] seen and examined today.  Patient continues to complain of nausea and some abdominal pain. Denies any chest pain or  shortness of breath at this time.  Objective:   Filed Vitals:   04/19/14 0930 04/19/14 1000 04/19/14 1030 04/19/14 1100  BP: 198/125 179/121 179/119 181/119  Pulse: 86 86 84 83  Temp:      TempSrc:      Resp: 18 18 18 18   Height:      Weight:      SpO2:        Wt Readings from Last 3 Encounters:  04/18/14 76.476 kg (168 lb 9.6 oz)  03/09/14 69.4 kg (153 lb)  12/12/13 72.576 kg (160 lb)     Intake/Output Summary (Last 24 hours) at 04/19/14 1213 Last data filed at 04/19/14 0700  Gross per 24 hour  Intake 1112.5 ml  Output    200 ml  Net  912.5 ml    Exam  General: Well developed, well  nourished, NAD, appears stated age  HEENT: NCAT, PERRLA, EOMI, Anicteic Sclera, mucous membranes moist.   Neck: Supple, no JVD, no masses  Cardiovascular: S1 S2 auscultated, no rubs, murmurs or gallops. Regular rate and rhythm.  Respiratory: Clear to auscultation bilaterally with equal chest rise  Abdomen: Soft, RUQ tender to palpation, nondistended, + bowel sounds  Extremities: warm dry without cyanosis clubbing or edema  Neuro: AAOx3, cranial nerves grossly intact. Strength 5/5 in patient's upper and lower extremities bilaterally  Skin: Without rashes exudates or nodules  Psych: Normal affect and demeanor with intact judgement and insight  Data Review   Micro Results No results found for this or any previous visit (from the past 240 hour(s)).  Radiology Reports Dg Chest 2 View  04/18/2014   CLINICAL DATA:  Abdominal pain for 4 days.  Chronic renal failure.  EXAM: CHEST  2 VIEW  COMPARISON:  IR SHUNTOGRAM/ FISTULAGRAM *L* dated 03/09/2014; DG CHEST 1V PORT dated 07/17/2012  FINDINGS: The heart remains enlarged. Dialysis catheter noted previously has been removed in the interim. There is mild vascular congestion but no infiltrates or failure. Similar appearance to priors otherwise. No acute osseous findings.  IMPRESSION: Cardiomegaly.  No active infiltrates.  No overt CHF.   Electronically Signed   By: Rolla Flatten M.D.   On: 04/18/2014 08:15   Mr Abdomen Wo Contrast  04/18/2014   CLINICAL DATA:  Right upper quadrant abdominal pain. Left lower pole renal mass.  EXAM: MRI ABDOMEN WITHOUT CONTRAST  TECHNIQUE: Multiplanar multisequence MR imaging was performed without the administration of intravenous contrast.  COMPARISON:  Abdominal ultrasound dated 04/18/2012. CT abdomen pelvis dated 03/08/2014.  FINDINGS: Cardiomegaly.  Iron deposition in the liver and spleen.  Pancreas and adrenal glands are within normal limits.  Gallbladder wall thickening/edema (series 3/image 16). Known gallstone  ultrasound is not evident on MRI. No intrahepatic or extrahepatic ductal dilatation.  Dominant 2.2 x 2.4 x 2.3 cm lesion in the anterior left lower kidney (series 3/image 14), with associated intrinsic T1 hyperintensity suggesting hemorrhage (series 7/image 36). This appearance favors a benign hemorrhagic cyst, although hemorrhage within a solid renal neoplasm remains possible.  Additional 1.7 x 1.7 x 1.6 cm lesion in the medial left upper pole (series 3/image 19), without hemorrhage, suspicious for solid renal neoplasm.  Numerous additional bilateral renal cysts of varying sizes and complexities, most of which likely reflect simple cysts, although incompletely characterized. No hydronephrosis.  No abdominal ascites.  No suspicious abdominal lymphadenopathy.  No focal osseous lesions.  IMPRESSION: 1.7 cm lesion in the medial left upper kidney, suspicious for solid renal neoplasm.  Additional 2.4 cm hemorrhagic  lesion in the anterior left lower kidney, favored to reflect a benign hemorrhagic cyst, although solitary neoplasm is not excluded.  Gallbladder wall thickening/edema with known cholelithiasis, worrisome for acute cholecystitis. Consider hepatobiliary nuclear medicine scan for further evaluation as clinically warranted.  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   Electronically Signed   By: Julian Hy M.D.   On: 04/18/2014 17:27   Nm Hepatobiliary  04/18/2014   CLINICAL DATA:  Gallbladder wall thickening on ultrasound. Abdominal pain. Rule out cholecystitis.  EXAM: NUCLEAR MEDICINE HEPATOBILIARY IMAGING  TECHNIQUE: Sequential images of the abdomen were obtained out to 60 minutes following intravenous administration of radiopharmaceutical.  RADIOPHARMACEUTICALS:  5.0 mCi Tc-31m Choletec  COMPARISON:  Ultrasound 04/18/2014  FINDINGS: There is prompt uptake and excretion of radiotracer by the liver. The gallbladder fills. The biliary system empties into the small bowel.  IMPRESSION: No evidence of cystic  duct or common bile duct obstruction.   Electronically Signed   By: Rolm Baptise M.D.   On: 04/18/2014 15:40   US Abdomen Complete  04/18/2014   CLINICAL DATA:  Abdominal pain.  EXAM: ULTRASOUND ABDOMEN COMPLETE  COMPARISON:  CT scan 03/08/2014 and renal ultrasound, same date.  FINDINGS: Gallbladder:  The gallbladder wall is thickened measuring up to 5 mm. No pericholecystic fluid. There is a 7 mm echogenic structure in the gallbladder neck. No acoustic shadowing is demonstrated. This could be an non shadowing gallstone or echogenic debris.  Common bile duct:  Diameter: 6.1 mm  Liver:  Normal echogenicity without focal lesion or biliary dilatation.  IVC:  Normal caliber.  Pancreas:  Sonographically unremarkable.  Spleen:  Size and appearance within normal limits.  Right Kidney:  Length: 7.0 cm. Increased echogenicity suggesting medical renal disease. There is a rounded 13 x 13 mm exophytic lesion involving the upper pole which could represent a complex cyst or solid nodule. A small upper pole cyst is also noted. This measures approximately 9 mm.  Left Kidney:  Length: 7.1 cm. Increased echogenicity suggesting medical renal disease. As demonstrated on the prior CT scan there is a 2.2 cm lesion projecting off the lower pole which could be a hemorrhagic cyst or mass.  Abdominal aorta:  No aneurysm visualized.  Other findings:  None.  IMPRESSION: 1. Gallbladder wall thickening could be due to acute cholecystitis. 7 mm non shadowing gallstone versus echogenic debris in the gallbladder neck. Borderline common bile duct. Nuclear medicine hepatobiliary scan may be helpful for further evaluation. 2. Small kidneys with increased echogenicity consistent with end-stage renal disease. 3. Bilateral renal lesions are indeterminate. MR imaging is suggested (without contrast given renal function).   Electronically Signed   By: Kalman Jewels M.D.   On: 04/18/2014 08:32    CBC  Recent Labs Lab 04/18/14 0550 04/19/14 0500    WBC 5.7 6.1  HGB 9.5* 9.7*  HCT 29.4* 29.3*  PLT 221 223  MCV 93.0 92.4  MCH 30.1 30.6  MCHC 32.3 33.1  RDW 14.6 14.5  LYMPHSABS 1.9  --   MONOABS 0.4  --   EOSABS 0.4  --   BASOSABS 0.0  --     Chemistries   Recent Labs Lab 04/18/14 0550 04/19/14 0500 04/19/14 1020  NA 141 136*  --   K 5.2 6.7* 3.2*  CL 95* 94*  --   CO2 26 19  --   GLUCOSE 93 108*  --   BUN 82* 98*  --   CREATININE 13.10* 15.13*  --   CALCIUM  8.8 7.7*  --   AST 27  --   --   ALT 45  --   --   ALKPHOS 49  --   --   BILITOT 0.3  --   --    ------------------------------------------------------------------------------------------------------------------ estimated creatinine clearance is 6.2 ml/min (by C-G formula based on Cr of 15.13). ------------------------------------------------------------------------------------------------------------------ No results found for this basename: HGBA1C,  in the last 72 hours ------------------------------------------------------------------------------------------------------------------ No results found for this basename: CHOL, HDL, LDLCALC, TRIG, CHOLHDL, LDLDIRECT,  in the last 72 hours ------------------------------------------------------------------------------------------------------------------ No results found for this basename: TSH, T4TOTAL, FREET3, T3FREE, THYROIDAB,  in the last 72 hours ------------------------------------------------------------------------------------------------------------------ No results found for this basename: VITAMINB12, FOLATE, FERRITIN, TIBC, IRON, RETICCTPCT,  in the last 72 hours  Coagulation profile  Recent Labs Lab 04/18/14 1502  INR 1.09    No results found for this basename: DDIMER,  in the last 72 hours  Cardiac Enzymes  Recent Labs Lab 04/18/14 1502 04/18/14 2115 04/18/14 2333  TROPONINI 0.35* <0.30 <0.30    ------------------------------------------------------------------------------------------------------------------ No components found with this basename: POCBNP,     Laveah Gloster D.O. on 04/19/2014 at 12:13 PM  Between 7am to 7pm - Pager - [phone]  After 7pm go to [email or web address] - password [email or web address]  And look for the night coverage person covering for me after hours  [facility] Office  [phone]

## 2014-04-19 NOTE — Procedures (Signed)
I was present at this dialysis session, have reviewed the session itself and made  appropriate changes  Kelly Splinter MD (pgr) [phone]    ([phone] 04/19/2014, 12:51 PM

## 2014-04-19 NOTE — Progress Notes (Signed)
[facility]                                                                              Patient Demographics  [name] [name], is a 48 y.o. male, DOB - Apr 24, 1966, [id number]  Admit date - 04/18/2014   Admitting Physician Charlynne Cousins, MD  Outpatient Primary MD for the patient is Pcp Not In System  LOS - 1   Chief Complaint  Patient presents with  . Abdominal Pain  . Back Pain        Assessment & Plan   Abdominal pain likely due to symptomatic cholelithiasis -Ultrasound the abdomen: Gallbladder wall thickening could be due to acute cholecystitis. 7 mm non shadowing gallstone versus echogenic debris in the gallbladder neck -MRI 1.7 cm lesion in the medial left upper kidney, suspicious for solid renal neoplasm, Gallbladder wall thickening/edema with known cholelithiasis, worrisome for acute cholecystitis. 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 -General surgery consulted and appreciated, recommended cholecystectomy, however not emergently.  End-stage renal disease requiring hemodialysis -Patient dialyzes on Tuesday Thursday and Saturday -Patient partially dialyzed on Tuesday due to restless leg symptoms, dialyzed today -Nephrology consulted and appreciated  Hypertension -Continue Coreg and Imdur  -Patient has had a history of noncompliance with medications  Combined diastolic and systolic congestive heart failure -Echocardiogram 07/16/2012 shows an EF of A999333, grade 2 diastolic dysfunction -Continue daily weights, strict intake and output -Currently appears compensated  Abnormal EKG with elevated troponin -Currently no chest pain or shortness of breath -Continue aspirin -EKG: Normal sinus rhythm -Initial Troponin 0.39, has trended downward to 0.3 -Likely secondary to end-stage renal disease  Polysubstance abuse -Toxicology screen positive for cocaine -Patient counseled  Restless leg  syndrome -Continue Requip  Anemia of chronic disease -Continue Aranesp, ferric gluconate -Continue to monitor CBC  Hyperkalemia -Resolved, Likely due to missed dialysis, currently 3.2  Code Status: Full  Family Communication: None at bedside  Disposition Plan: Admitted  Time Spent in minutes   30 minutes  Procedures  HIDA  Consults   Nephrology General surgery  DVT Prophylaxis  Heparin  Lab Results  Component Value Date   PLT 223 04/19/2014    Medications  Scheduled Meds: . aspirin EC  325 mg Oral Once  . calcium acetate  2,001 mg Oral TID WC  . carvedilol  25 mg Oral BID WC  . darbepoetin      . darbepoetin (ARANESP) injection - DIALYSIS  25 mcg Intravenous Q Wed-HD  . doxercalciferol  1 mcg Intravenous Q M,W,F-HD  . ferric gluconate (FERRLECIT/NULECIT) IV  62.5 mg Intravenous Weekly  . heparin  5,000 Units Subcutaneous 3 times per day  . isosorbide mononitrate  60 mg Oral Daily  . multivitamin  1 tablet Oral QHS  . rOPINIRole  0.25 mg Oral QHS   Continuous Infusions: . dextrose 5 % and 0.45% NaCl 50 mL/hr at 04/18/14 1221   PRN Meds:.acetaminophen, acetaminophen, morphine injection, promethazine  Antibiotics    Anti-infectives   None      Subjective:   [name] seen and examined today.  Patient continues to complain of nausea and some abdominal  pain. Denies any chest pain or shortness of breath at this time.  Objective:   Filed Vitals:   04/19/14 1100 04/19/14 1130 04/19/14 1200 04/19/14 1215  BP: 181/119 172/102 174/109 150/115  Pulse: 83 80 79 85  Temp:      TempSrc:      Resp: 18 18 18 18   Height:      Weight:      SpO2:        Wt Readings from Last 3 Encounters:  04/18/14 76.476 kg (168 lb 9.6 oz)  03/09/14 69.4 kg (153 lb)  12/12/13 72.576 kg (160 lb)     Intake/Output Summary (Last 24 hours) at 04/19/14 1224 Last data filed at 04/19/14 1215  Gross per 24 hour  Intake 1112.5 ml  Output   4135 ml  Net -3022.5 ml     Exam  General: Well developed, well nourished, NAD, appears stated age  HEENT: NCAT, PERRLA, EOMI, Anicteic Sclera, mucous membranes moist.   Neck: Supple, no JVD, no masses  Cardiovascular: S1 S2 auscultated, no rubs, murmurs or gallops. Regular rate and rhythm.  Respiratory: Clear to auscultation bilaterally with equal chest rise  Abdomen: Soft, RUQ tender to palpation, nondistended, + bowel sounds  Extremities: warm dry without cyanosis clubbing or edema  Neuro: AAOx3, cranial nerves grossly intact. Strength 5/5 in patient's upper and lower extremities bilaterally  Skin: Without rashes exudates or nodules  Psych: Normal affect and demeanor with intact judgement and insight  Data Review   Micro Results No results found for this or any previous visit (from the past 240 hour(s)).  Radiology Reports Dg Chest 2 View  04/18/2014   CLINICAL DATA:  Abdominal pain for 4 days.  Chronic renal failure.  EXAM: CHEST  2 VIEW  COMPARISON:  IR SHUNTOGRAM/ FISTULAGRAM *L* dated 03/09/2014; DG CHEST 1V PORT dated 07/17/2012  FINDINGS: The heart remains enlarged. Dialysis catheter noted previously has been removed in the interim. There is mild vascular congestion but no infiltrates or failure. Similar appearance to priors otherwise. No acute osseous findings.  IMPRESSION: Cardiomegaly.  No active infiltrates.  No overt CHF.   Electronically Signed   By: Rolla Flatten M.D.   On: 04/18/2014 08:15   Mr Abdomen Wo Contrast  04/18/2014   CLINICAL DATA:  Right upper quadrant abdominal pain. Left lower pole renal mass.  EXAM: MRI ABDOMEN WITHOUT CONTRAST  TECHNIQUE: Multiplanar multisequence MR imaging was performed without the administration of intravenous contrast.  COMPARISON:  Abdominal ultrasound dated 04/18/2012. CT abdomen pelvis dated 03/08/2014.  FINDINGS: Cardiomegaly.  Iron deposition in the liver and spleen.  Pancreas and adrenal glands are within normal limits.  Gallbladder wall  thickening/edema (series 3/image 16). Known gallstone ultrasound is not evident on MRI. No intrahepatic or extrahepatic ductal dilatation.  Dominant 2.2 x 2.4 x 2.3 cm lesion in the anterior left lower kidney (series 3/image 14), with associated intrinsic T1 hyperintensity suggesting hemorrhage (series 7/image 36). This appearance favors a benign hemorrhagic cyst, although hemorrhage within a solid renal neoplasm remains possible.  Additional 1.7 x 1.7 x 1.6 cm lesion in the medial left upper pole (series 3/image 19), without hemorrhage, suspicious for solid renal neoplasm.  Numerous additional bilateral renal cysts of varying sizes and complexities, most of which likely reflect simple cysts, although incompletely characterized. No hydronephrosis.  No abdominal ascites.  No suspicious abdominal lymphadenopathy.  No focal osseous lesions.  IMPRESSION: 1.7 cm lesion in the medial left upper kidney, suspicious for solid renal neoplasm.  Additional 2.4 cm hemorrhagic lesion in the anterior left lower kidney, favored to reflect a benign hemorrhagic cyst, although solitary neoplasm is not excluded.  Gallbladder wall thickening/edema with known cholelithiasis, worrisome for acute cholecystitis. Consider hepatobiliary nuclear medicine scan for further evaluation as clinically warranted.  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   Electronically Signed   By: Julian Hy M.D.   On: 04/18/2014 17:27   Nm Hepatobiliary  04/18/2014   CLINICAL DATA:  Gallbladder wall thickening on ultrasound. Abdominal pain. Rule out cholecystitis.  EXAM: NUCLEAR MEDICINE HEPATOBILIARY IMAGING  TECHNIQUE: Sequential images of the abdomen were obtained out to 60 minutes following intravenous administration of radiopharmaceutical.  RADIOPHARMACEUTICALS:  5.0 mCi Tc-92m Choletec  COMPARISON:  Ultrasound 04/18/2014  FINDINGS: There is prompt uptake and excretion of radiotracer by the liver. The gallbladder fills. The biliary system empties into  the small bowel.  IMPRESSION: No evidence of cystic duct or common bile duct obstruction.   Electronically Signed   By: Rolm Baptise M.D.   On: 04/18/2014 15:40   US Abdomen Complete  04/18/2014   CLINICAL DATA:  Abdominal pain.  EXAM: ULTRASOUND ABDOMEN COMPLETE  COMPARISON:  CT scan 03/08/2014 and renal ultrasound, same date.  FINDINGS: Gallbladder:  The gallbladder wall is thickened measuring up to 5 mm. No pericholecystic fluid. There is a 7 mm echogenic structure in the gallbladder neck. No acoustic shadowing is demonstrated. This could be an non shadowing gallstone or echogenic debris.  Common bile duct:  Diameter: 6.1 mm  Liver:  Normal echogenicity without focal lesion or biliary dilatation.  IVC:  Normal caliber.  Pancreas:  Sonographically unremarkable.  Spleen:  Size and appearance within normal limits.  Right Kidney:  Length: 7.0 cm. Increased echogenicity suggesting medical renal disease. There is a rounded 13 x 13 mm exophytic lesion involving the upper pole which could represent a complex cyst or solid nodule. A small upper pole cyst is also noted. This measures approximately 9 mm.  Left Kidney:  Length: 7.1 cm. Increased echogenicity suggesting medical renal disease. As demonstrated on the prior CT scan there is a 2.2 cm lesion projecting off the lower pole which could be a hemorrhagic cyst or mass.  Abdominal aorta:  No aneurysm visualized.  Other findings:  None.  IMPRESSION: 1. Gallbladder wall thickening could be due to acute cholecystitis. 7 mm non shadowing gallstone versus echogenic debris in the gallbladder neck. Borderline common bile duct. Nuclear medicine hepatobiliary scan may be helpful for further evaluation. 2. Small kidneys with increased echogenicity consistent with end-stage renal disease. 3. Bilateral renal lesions are indeterminate. MR imaging is suggested (without contrast given renal function).   Electronically Signed   By: Kalman Jewels M.D.   On: 04/18/2014 08:32     CBC  Recent Labs Lab 04/18/14 0550 04/19/14 0500  WBC 5.7 6.1  HGB 9.5* 9.7*  HCT 29.4* 29.3*  PLT 221 223  MCV 93.0 92.4  MCH 30.1 30.6  MCHC 32.3 33.1  RDW 14.6 14.5  LYMPHSABS 1.9  --   MONOABS 0.4  --   EOSABS 0.4  --   BASOSABS 0.0  --     Chemistries   Recent Labs Lab 04/18/14 0550 04/19/14 0500 04/19/14 1020  NA 141 136*  --   K 5.2 6.7* 3.2*  CL 95* 94*  --   CO2 26 19  --   GLUCOSE 93 108*  --   BUN 82* 98*  --   CREATININE 13.10* 15.13*  --  CALCIUM 8.8 7.7*  --   AST 27  --   --   ALT 45  --   --   ALKPHOS 49  --   --   BILITOT 0.3  --   --    ------------------------------------------------------------------------------------------------------------------ estimated creatinine clearance is 6.2 ml/min (by C-G formula based on Cr of 15.13). ------------------------------------------------------------------------------------------------------------------ No results found for this basename: HGBA1C,  in the last 72 hours ------------------------------------------------------------------------------------------------------------------ No results found for this basename: CHOL, HDL, LDLCALC, TRIG, CHOLHDL, LDLDIRECT,  in the last 72 hours ------------------------------------------------------------------------------------------------------------------ No results found for this basename: TSH, T4TOTAL, FREET3, T3FREE, THYROIDAB,  in the last 72 hours ------------------------------------------------------------------------------------------------------------------ No results found for this basename: VITAMINB12, FOLATE, FERRITIN, TIBC, IRON, RETICCTPCT,  in the last 72 hours  Coagulation profile  Recent Labs Lab 04/18/14 1502  INR 1.09    No results found for this basename: DDIMER,  in the last 72 hours  Cardiac Enzymes  Recent Labs Lab 04/18/14 1502 04/18/14 2115 04/18/14 2333  TROPONINI 0.35* <0.30 <0.30    ------------------------------------------------------------------------------------------------------------------ No components found with this basename: POCBNP,     Rhett Najera D.O. on 04/19/2014 at 12:24 PM  Between 7am to 7pm - Pager - [phone]  After 7pm go to [email or web address] - password [email or web address]  And look for the night coverage person covering for me after hours  [facility] Office  [phone]

## 2014-04-19 NOTE — Progress Notes (Addendum)
Patient ID: [name] [name], male   DOB: [birth year], 48 y.o.   MRN: [id number]   Subjective: Family brought his a hamburger last night, his pain acutely worsened.  No n/v.  Pain is better today.  No white count.    Objective:  Vital signs:  Filed Vitals:   04/18/14 2128 04/19/14 0544 04/19/14 0756 04/19/14 0807  BP: 158/110 150/105 156/100 154/109  Pulse: 71 73 69 70  Temp: 98.4 F (36.9 C) 98.4 F (36.9 C) 98.7 F (37.1 C)   TempSrc: Oral Oral Oral   Resp: 16 17 18 18   Height:      Weight: 168 lb 9.6 oz (76.476 kg)     SpO2: 95% 100%      Last BM Date: 04/17/14  Intake/Output   Yesterday:  05/05 0701 - 05/06 0700 In: 1112.5 [P.O.:180; I.V.:932.5] Out: 200 [Urine:200] This shift:    I/O last 3 completed shifts: In: 1112.5 [P.O.:180; I.V.:932.5] Out: 200 [Urine:200]    Physical Exam: General: Pt awake/alert/oriented x4 in no acute distress Abdomen: Soft.  Nondistended.  TTP RUQ.  No evidence of peritonitis.  No incarcerated hernias.   Problem List:   Principal Problem:   Cholecystitis Active Problems:   TOBACCO ABUSE   ESRD (end stage renal disease) on dialysis   Elevated troponin   HTN (hypertension)   Combined congestive systolic and diastolic heart failure   Abdominal pain    Results:   Labs: Results for orders placed during the hospital encounter of 04/18/14 (from the past 48 hour(s))  CBC WITH DIFFERENTIAL     Status: Abnormal   Collection Time    04/18/14  5:50 AM      Result Value Ref Range   WBC 5.7  4.0 - 10.5 K/uL   RBC 3.16 (*) 4.22 - 5.81 MIL/uL   Hemoglobin 9.5 (*) 13.0 - 17.0 g/dL   HCT 29.4 (*) 39.0 - 52.0 %   MCV 93.0  78.0 - 100.0 fL   MCH 30.1  26.0 - 34.0 pg   MCHC 32.3  30.0 - 36.0 g/dL   RDW 14.6  11.5 - 15.5 %   Platelets 221  150 - 400 K/uL   Neutrophils Relative % 52  43 - 77 %   Neutro Abs 3.0  1.7 - 7.7 K/uL   Lymphocytes Relative 33  12 - 46 %   Lymphs Abs 1.9  0.7 - 4.0 K/uL   Monocytes Relative 7  3 - 12 %    Monocytes Absolute 0.4  0.1 - 1.0 K/uL   Eosinophils Relative 7 (*) 0 - 5 %   Eosinophils Absolute 0.4  0.0 - 0.7 K/uL   Basophils Relative 1  0 - 1 %   Basophils Absolute 0.0  0.0 - 0.1 K/uL  COMPREHENSIVE METABOLIC PANEL     Status: Abnormal   Collection Time    04/18/14  5:50 AM      Result Value Ref Range   Sodium 141  137 - 147 mEq/L   Potassium 5.2  3.7 - 5.3 mEq/L   Chloride 95 (*) 96 - 112 mEq/L   CO2 26  19 - 32 mEq/L   Glucose, Bld 93  70 - 99 mg/dL   BUN 82 (*) 6 - 23 mg/dL   Creatinine, Ser 13.10 (*) 0.50 - 1.35 mg/dL   Calcium 8.8  8.4 - 10.5 mg/dL   Total Protein 7.4  6.0 - 8.3 g/dL   Albumin 3.4 (*) 3.5 - 5.2 g/dL  AST 27  0 - 37 U/L   ALT 45  0 - 53 U/L   Alkaline Phosphatase 49  39 - 117 U/L   Total Bilirubin 0.3  0.3 - 1.2 mg/dL   GFR calc non Af Amer 4 (*) >90 mL/min   GFR calc Af Amer 5 (*) >90 mL/min   Comment: (NOTE)     The eGFR has been calculated using the CKD EPI equation.     This calculation has not been validated in all clinical situations.     eGFR's persistently <90 mL/min signify possible Chronic Kidney     Disease.  LIPASE, BLOOD     Status: None   Collection Time    04/18/14  5:50 AM      Result Value Ref Range   Lipase 27  11 - 59 U/L  I-STAT TROPOININ, ED     Status: Abnormal   Collection Time    04/18/14  5:52 AM      Result Value Ref Range   Troponin i, poc 0.28 (*) 0.00 - 0.08 ng/mL   Comment NOTIFIED PHYSICIAN     Comment 3            Comment: Due to the release kinetics of cTnI,     a negative result within the first hours     of the onset of symptoms does not rule out     myocardial infarction with certainty.     If myocardial infarction is still suspected,     repeat the test at appropriate intervals.  TROPONIN I     Status: Abnormal   Collection Time    04/18/14  9:20 AM      Result Value Ref Range   Troponin I 0.39 (*) <0.30 ng/mL   Comment:            Due to the release kinetics of cTnI,     a negative result within  the first hours     of the onset of symptoms does not rule out     myocardial infarction with certainty.     If myocardial infarction is still suspected,     repeat the test at appropriate intervals.     CRITICAL RESULT CALLED TO, READ BACK BY AND VERIFIED WITH:     L.BERDIK,RN 04/18/14 0956 BY BSLADE  GLUCOSE, CAPILLARY     Status: Abnormal   Collection Time    04/18/14 11:58 AM      Result Value Ref Range   Glucose-Capillary 67 (*) 70 - 99 mg/dL  TROPONIN I     Status: Abnormal   Collection Time    04/18/14  3:02 PM      Result Value Ref Range   Troponin I 0.35 (*) <0.30 ng/mL   Comment:            Due to the release kinetics of cTnI,     a negative result within the first hours     of the onset of symptoms does not rule out     myocardial infarction with certainty.     If myocardial infarction is still suspected,     repeat the test at appropriate intervals.     CRITICAL VALUE NOTED.  VALUE IS CONSISTENT WITH PREVIOUSLY REPORTED AND CALLED VALUE.  PROTIME-INR     Status: None   Collection Time    04/18/14  3:02 PM      Result Value Ref Range   Prothrombin Time 13.9  11.6 -  15.2 seconds   INR 1.09  0.00 - 1.49  GLUCOSE, CAPILLARY     Status: None   Collection Time    04/18/14  5:15 PM      Result Value Ref Range   Glucose-Capillary 81  70 - 99 mg/dL   Comment 1 Notify RN     Comment 2 Documented in Chart    TROPONIN I     Status: None   Collection Time    04/18/14  9:15 PM      Result Value Ref Range   Troponin I <0.30  <0.30 ng/mL   Comment:            Due to the release kinetics of cTnI,     a negative result within the first hours     of the onset of symptoms does not rule out     myocardial infarction with certainty.     If myocardial infarction is still suspected,     repeat the test at appropriate intervals.  TROPONIN I     Status: None   Collection Time    04/18/14 11:33 PM      Result Value Ref Range   Troponin I <0.30  <0.30 ng/mL   Comment:             Due to the release kinetics of cTnI,     a negative result within the first hours     of the onset of symptoms does not rule out     myocardial infarction with certainty.     If myocardial infarction is still suspected,     repeat the test at appropriate intervals.  URINALYSIS, ROUTINE W REFLEX MICROSCOPIC     Status: Abnormal   Collection Time    04/19/14  1:09 AM      Result Value Ref Range   Color, Urine YELLOW  YELLOW   APPearance CLEAR  CLEAR   Specific Gravity, Urine 1.012  1.005 - 1.030   pH 7.0  5.0 - 8.0   Glucose, UA NEGATIVE  NEGATIVE mg/dL   Hgb urine dipstick SMALL (*) NEGATIVE   Bilirubin Urine NEGATIVE  NEGATIVE   Ketones, ur NEGATIVE  NEGATIVE mg/dL   Protein, ur 100 (*) NEGATIVE mg/dL   Urobilinogen, UA 0.2  0.0 - 1.0 mg/dL   Nitrite NEGATIVE  NEGATIVE   Leukocytes, UA TRACE (*) NEGATIVE  URINE RAPID DRUG SCREEN (HOSP PERFORMED)     Status: Abnormal   Collection Time    04/19/14  1:09 AM      Result Value Ref Range   Opiates NONE DETECTED  NONE DETECTED   Cocaine POSITIVE (*) NONE DETECTED   Benzodiazepines NONE DETECTED  NONE DETECTED   Amphetamines NONE DETECTED  NONE DETECTED   Tetrahydrocannabinol NONE DETECTED  NONE DETECTED   Barbiturates NONE DETECTED  NONE DETECTED   Comment:            DRUG SCREEN FOR MEDICAL PURPOSES     ONLY.  IF CONFIRMATION IS NEEDED     FOR ANY PURPOSE, NOTIFY LAB     WITHIN 5 DAYS.                LOWEST DETECTABLE LIMITS     FOR URINE DRUG SCREEN     Drug Class       Cutoff (ng/mL)     Amphetamine      1000     Barbiturate      200  Benzodiazepine   329     Tricyclics       924     Opiates          300     Cocaine          300     THC              50  URINE MICROSCOPIC-ADD ON     Status: None   Collection Time    04/19/14  1:09 AM      Result Value Ref Range   Squamous Epithelial / LPF RARE  RARE   WBC, UA 0-2  <3 WBC/hpf   RBC / HPF 0-2  <3 RBC/hpf   Bacteria, UA RARE  RARE  CBC     Status: Abnormal    Collection Time    04/19/14  5:00 AM      Result Value Ref Range   WBC 6.1  4.0 - 10.5 K/uL   RBC 3.17 (*) 4.22 - 5.81 MIL/uL   Hemoglobin 9.7 (*) 13.0 - 17.0 g/dL   HCT 29.3 (*) 39.0 - 52.0 %   MCV 92.4  78.0 - 100.0 fL   MCH 30.6  26.0 - 34.0 pg   MCHC 33.1  30.0 - 36.0 g/dL   RDW 14.5  11.5 - 15.5 %   Platelets 223  150 - 400 K/uL    Imaging / Studies: Dg Chest 2 View  04/18/2014   CLINICAL DATA:  Abdominal pain for 4 days.  Chronic renal failure.  EXAM: CHEST  2 VIEW  COMPARISON:  IR SHUNTOGRAM/ FISTULAGRAM *L* dated 03/09/2014; DG CHEST 1V PORT dated 07/17/2012  FINDINGS: The heart remains enlarged. Dialysis catheter noted previously has been removed in the interim. There is mild vascular congestion but no infiltrates or failure. Similar appearance to priors otherwise. No acute osseous findings.  IMPRESSION: Cardiomegaly.  No active infiltrates.  No overt CHF.   Electronically Signed   By: Rolla Flatten M.D.   On: 04/18/2014 08:15   Mr Abdomen Wo Contrast  04/18/2014   CLINICAL DATA:  Right upper quadrant abdominal pain. Left lower pole renal mass.  EXAM: MRI ABDOMEN WITHOUT CONTRAST  TECHNIQUE: Multiplanar multisequence MR imaging was performed without the administration of intravenous contrast.  COMPARISON:  Abdominal ultrasound dated 04/18/2012. CT abdomen pelvis dated 03/08/2014.  FINDINGS: Cardiomegaly.  Iron deposition in the liver and spleen.  Pancreas and adrenal glands are within normal limits.  Gallbladder wall thickening/edema (series 3/image 16). Known gallstone ultrasound is not evident on MRI. No intrahepatic or extrahepatic ductal dilatation.  Dominant 2.2 x 2.4 x 2.3 cm lesion in the anterior left lower kidney (series 3/image 14), with associated intrinsic T1 hyperintensity suggesting hemorrhage (series 7/image 36). This appearance favors a benign hemorrhagic cyst, although hemorrhage within a solid renal neoplasm remains possible.  Additional 1.7 x 1.7 x 1.6 cm lesion in the medial  left upper pole (series 3/image 19), without hemorrhage, suspicious for solid renal neoplasm.  Numerous additional bilateral renal cysts of varying sizes and complexities, most of which likely reflect simple cysts, although incompletely characterized. No hydronephrosis.  No abdominal ascites.  No suspicious abdominal lymphadenopathy.  No focal osseous lesions.  IMPRESSION: 1.7 cm lesion in the medial left upper kidney, suspicious for solid renal neoplasm.  Additional 2.4 cm hemorrhagic lesion in the anterior left lower kidney, favored to reflect a benign hemorrhagic cyst, although solitary neoplasm is not excluded.  Gallbladder wall thickening/edema with known cholelithiasis, worrisome for  acute cholecystitis. Consider hepatobiliary nuclear medicine scan for further evaluation as clinically warranted.  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   Electronically Signed   By: Julian Hy M.D.   On: 04/18/2014 17:27   Nm Hepatobiliary  04/18/2014   CLINICAL DATA:  Gallbladder wall thickening on ultrasound. Abdominal pain. Rule out cholecystitis.  EXAM: NUCLEAR MEDICINE HEPATOBILIARY IMAGING  TECHNIQUE: Sequential images of the abdomen were obtained out to 60 minutes following intravenous administration of radiopharmaceutical.  RADIOPHARMACEUTICALS:  5.0 mCi Tc-28mCholetec  COMPARISON:  Ultrasound 04/18/2014  FINDINGS: There is prompt uptake and excretion of radiotracer by the liver. The gallbladder fills. The biliary system empties into the small bowel.  IMPRESSION: No evidence of cystic duct or common bile duct obstruction.   Electronically Signed   By: KRolm BaptiseM.D.   On: 04/18/2014 15:40   UKoreaAbdomen Complete  04/18/2014   CLINICAL DATA:  Abdominal pain.  EXAM: ULTRASOUND ABDOMEN COMPLETE  COMPARISON:  CT scan 03/08/2014 and renal ultrasound, same date.  FINDINGS: Gallbladder:  The gallbladder wall is thickened measuring up to 5 mm. No pericholecystic fluid. There is a 7 mm echogenic structure in the  gallbladder neck. No acoustic shadowing is demonstrated. This could be an non shadowing gallstone or echogenic debris.  Common bile duct:  Diameter: 6.1 mm  Liver:  Normal echogenicity without focal lesion or biliary dilatation.  IVC:  Normal caliber.  Pancreas:  Sonographically unremarkable.  Spleen:  Size and appearance within normal limits.  Right Kidney:  Length: 7.0 cm. Increased echogenicity suggesting medical renal disease. There is a rounded 13 x 13 mm exophytic lesion involving the upper pole which could represent a complex cyst or solid nodule. A small upper pole cyst is also noted. This measures approximately 9 mm.  Left Kidney:  Length: 7.1 cm. Increased echogenicity suggesting medical renal disease. As demonstrated on the prior CT scan there is a 2.2 cm lesion projecting off the lower pole which could be a hemorrhagic cyst or mass.  Abdominal aorta:  No aneurysm visualized.  Other findings:  None.  IMPRESSION: 1. Gallbladder wall thickening could be due to acute cholecystitis. 7 mm non shadowing gallstone versus echogenic debris in the gallbladder neck. Borderline common bile duct. Nuclear medicine hepatobiliary scan may be helpful for further evaluation. 2. Small kidneys with increased echogenicity consistent with end-stage renal disease. 3. Bilateral renal lesions are indeterminate. MR imaging is suggested (without contrast given renal function).   Electronically Signed   By: MKalman JewelsM.D.   On: 04/18/2014 08:32    Medications / Allergies: per chart  Antibiotics: Anti-infectives   None      Assessment/Plan Hx polysubstance abuse(+cocaine UDS this admission)  HTN  ESRD  Left renal mass Symptomatic cholelithiasis without acute cholecystitis, possible chronic cholecystitis -HIDA scan is negative.  MRI reveals gb thickening, cholelithiasis.   -start him on clears and advance to a low fat diet.  He is to remain on a low fat diet.  He will likely require a cholecystectomy at some  point, but does not need to be done on urgent basis.  He needs to stop using cocaine before we can proceed with surgery.  -GI eval -further work up for renal mass per primary team  EErby Pian [facility] Pager [phone] [phone] 04/19/2014 8:36 AM  Agree with above. Now on isolation for MRSA. Urine positive for cocaine.  As above, outside of emergency surgery, he is not a candidate for surgery  until he is off the cocaine. He said that he used to use drugs, but had been off until he made a mistake recently. He feels better post dialysis today.  Plans to stay here until Friday. I discussed these findings with the patient. We will sign off.  Alphonsa Overall, MD, [facility] Surgery Pager: [phone] Office phone:  [phone]

## 2014-04-19 NOTE — Progress Notes (Signed)
  [facility] Progress Note   Subjective: Feels a lot better, restless leg symptoms are better today. Started on Requip last night. Got thru HD without any problems today.   Filed Vitals:   04/19/14 1130 04/19/14 1200 04/19/14 1215 04/19/14 1300  BP: 172/102 174/109 150/115 126/76  Pulse: 80 79 85 75  Temp:   98.9 F (37.2 C) 99.8 F (37.7 C)  TempSrc:   Oral Oral  Resp: 18 18 18 17   Height:      Weight:   72.2 kg (159 lb 2.8 oz)   SpO2:    98%   Exam: Looks better, uremic fetor improved No jvd Chest clear bilat RRR no MRG Abd scaphoid, NTND No LE edema, ulcers L AVF +bruit Neuro is nf, ox3  HD: [facility] 4h  F160   70.5kg   2/2.5 Bath  Heparin 5000   LUA AVF Hect 1  EPO 1200  Venofer 50/wk       Assessment: 1 Abd pain- possible cholecystitis, HIDA scan negative. Per surg advance diet, may need GB out but not urgent 2 ESRD on HD- uremic from chronic underdialysis d/t noncompliance 3 Restless legs syndrome- primary or due to uremia, or both; better on Requip 4 Systolic CHF EF [id number] 5 HTN/volume up 2kg, BP up, on coreg and Imdur only 6 Drug abuse (cocaine+)  Plan- HD again tomorrow and Friday for chronic underdialysis with uremia, continue ropirinole (Requip) for RLS, may be helping with HD compliance    Kelly Splinter MD  pager [phone]    cell [phone]  04/19/2014, 1:51 PM     Recent Labs Lab 04/18/14 0550 04/19/14 0500 04/19/14 1020  NA 141 136*  --   K 5.2 6.7* 3.2*  CL 95* 94*  --   CO2 26 19  --   GLUCOSE 93 108*  --   BUN 82* 98*  --   CREATININE 13.10* 15.13*  --   CALCIUM 8.8 7.7*  --     Recent Labs Lab 04/18/14 0550  AST 27  ALT 45  ALKPHOS 49  BILITOT 0.3  PROT 7.4  ALBUMIN 3.4*    Recent Labs Lab 04/18/14 0550 04/19/14 0500  WBC 5.7 6.1  NEUTROABS 3.0  --   HGB 9.5* 9.7*  HCT 29.4* 29.3*  MCV 93.0 92.4  PLT 221 223   . aspirin EC  325 mg Oral Once  . calcium acetate  2,001 mg Oral TID WC  . carvedilol  25 mg  Oral BID WC  . darbepoetin (ARANESP) injection - DIALYSIS  25 mcg Intravenous Q Wed-HD  . doxercalciferol  1 mcg Intravenous Q M,W,F-HD  . ferric gluconate (FERRLECIT/NULECIT) IV  62.5 mg Intravenous Weekly  . heparin  5,000 Units Subcutaneous 3 times per day  . isosorbide mononitrate  60 mg Oral Daily  . multivitamin  1 tablet Oral QHS  . rOPINIRole  0.25 mg Oral QHS   . dextrose 5 % and 0.45% NaCl Stopped (04/19/14 1347)   acetaminophen, acetaminophen, morphine injection, promethazine

## 2014-04-19 NOTE — Progress Notes (Signed)
Pt A&OX4.  VSS.  Denies pain at this time.  Pleasant.

## 2014-04-20 DIAGNOSIS — I120 Hypertensive chronic kidney disease with stage 5 chronic kidney disease or end stage renal disease: Secondary | ICD-10-CM | POA: Diagnosis not present

## 2014-04-20 DIAGNOSIS — N186 End stage renal disease: Secondary | ICD-10-CM | POA: Diagnosis not present

## 2014-04-20 DIAGNOSIS — N039 Chronic nephritic syndrome with unspecified morphologic changes: Secondary | ICD-10-CM | POA: Diagnosis not present

## 2014-04-20 DIAGNOSIS — N2581 Secondary hyperparathyroidism of renal origin: Secondary | ICD-10-CM | POA: Diagnosis not present

## 2014-04-20 LAB — CBC
HEMATOCRIT: 33.4 % — AB (ref 39.0–52.0)
Hemoglobin: 11 g/dL — ABNORMAL LOW (ref 13.0–17.0)
MCH: 30.8 pg (ref 26.0–34.0)
MCHC: 32.9 g/dL (ref 30.0–36.0)
MCV: 93.6 fL (ref 78.0–100.0)
Platelets: 211 10*3/uL (ref 150–400)
RBC: 3.57 MIL/uL — ABNORMAL LOW (ref 4.22–5.81)
RDW: 14.6 % (ref 11.5–15.5)
WBC: 5.9 10*3/uL (ref 4.0–10.5)

## 2014-04-20 LAB — BASIC METABOLIC PANEL
BUN: 51 mg/dL — AB (ref 6–23)
CO2: 25 mEq/L (ref 19–32)
Calcium: 8.4 mg/dL (ref 8.4–10.5)
Chloride: 94 mEq/L — ABNORMAL LOW (ref 96–112)
Creatinine, Ser: 10.65 mg/dL — ABNORMAL HIGH (ref 0.50–1.35)
GFR, EST AFRICAN AMERICAN: 6 mL/min — AB (ref >90)
GFR, EST NON AFRICAN AMERICAN: 5 mL/min — AB (ref >90)
Glucose, Bld: 86 mg/dL (ref 70–99)
POTASSIUM: 3.9 meq/L (ref 3.7–5.3)
Sodium: 140 mEq/L (ref 137–147)

## 2014-04-20 LAB — HEPATITIS B SURFACE ANTIGEN: Hepatitis B Surface Ag: NEGATIVE

## 2014-04-20 MED ORDER — DIPHENHYDRAMINE HCL 25 MG PO CAPS
50.0000 mg | ORAL_CAPSULE | Freq: Every evening | ORAL | Status: DC | PRN
  Administered 2014-04-21: 50 mg via ORAL
  Filled 2014-04-20: qty 2

## 2014-04-20 NOTE — Progress Notes (Signed)
Utilization review completed. Kasee Hantz, RN, BSN. 

## 2014-04-20 NOTE — Progress Notes (Addendum)
Subjective:   Feeling great, no abdominal pain. Tolerated HD yesterday and slept well last night, no restless leg.   Objective Filed Vitals:   04/19/14 1300 04/19/14 1722 04/19/14 2100 04/20/14 0500  BP: 126/76 142/96 137/90 182/117  Pulse: 75 73 70 77  Temp: 99.8 F (37.7 C) 98.8 F (37.1 C) 99.2 F (37.3 C) 98.3 F (36.8 C)  TempSrc: Oral Oral Oral Oral  Resp: 17 16 16 16   Height:      Weight:   72 kg (158 lb 11.7 oz)   SpO2: 98% 100% 94% 100%   Physical Exam General: alert and oriented. No acute distress. Heart: RRR Lungs: CTA, unlabored Abdomen: soft, nontender. +BS Extremities: no edema Dialysis Access:  L AVF +bruit/thrill  Dialysis Orders: [facility]  70.5kg 4hr 160 400/1.5 2K /2.5Ca LAVF Heparin 5000  hectorol 1 mcg IV/HD Epogen 1200 Units IV/HD Venofer 50 week   Assessment/Plan:  1. Abdominal pain-possible cholecystitis per abd [place].  HIDA negative.  2. Elevated troponin- trending back down. without chestpain.  3. Renal mass- ? RCC, outpt followup next week 4. ESRD - MWF @ [place], noncompliant with running full tx out pt. HD pending again today 5. Restless leg- limits HD, started Requip 0.25 mg qhs 6. Hypertension/volume - SBP up to 180. noncomplaint with medications. Cont home coreg and imdur 7. Anemia - hgb 11cont ESA and Fe 8. Metabolic bone disease - Ca+ 8.4. phos 11.1 PTH 291 (from 4/22) phoslo 3 TID. Cont hectorol 9. Nutrition - alb 3.4. Full liquid and multivit 10. Substance abuse- cocaine +  Shelle Iron, NP [place] [phone] 04/20/2014,9:12 AM  LOS: 2 days   Pt seen, examined and agree w A/P as above. No indication for acute surgery, abd pain somewhat better. Looks a lot better.  Responding well to RLS therapy w ropirinole.  Plan HD today and 1st shift tomorrow in case of d/c tomorrow. Please make sure he has at least 3 months prescription for ropirinole at d/c , thanks.  Kelly Splinter MD pager [phone]    cell  [phone] 04/20/2014, 1:49 PM     Additional Objective Labs: Basic Metabolic Panel:  Recent Labs Lab 04/18/14 0550 04/19/14 0500 04/19/14 1020 04/20/14 0606  NA 141 136*  --  140  K 5.2 6.7* 3.2* 3.9  CL 95* 94*  --  94*  CO2 26 19  --  25  GLUCOSE 93 108*  --  86  BUN 82* 98*  --  51*  CREATININE 13.10* 15.13*  --  10.65*  CALCIUM 8.8 7.7*  --  8.4   Liver Function Tests:  Recent Labs Lab 04/18/14 0550  AST 27  ALT 45  ALKPHOS 49  BILITOT 0.3  PROT 7.4  ALBUMIN 3.4*    Recent Labs Lab 04/18/14 0550  LIPASE 27   CBC:  Recent Labs Lab 04/18/14 0550 04/19/14 0500 04/20/14 0606  WBC 5.7 6.1 5.9  NEUTROABS 3.0  --   --   HGB 9.5* 9.7* 11.0*  HCT 29.4* 29.3* 33.4*  MCV 93.0 92.4 93.6  PLT 221 223 211   Blood Culture    Component Value Date/Time   SDES URINE, CATHETERIZED 07/21/2012 1432   SPECREQUEST NONE 07/21/2012 1432   CULT NO GROWTH 07/21/2012 1432   REPTSTATUS 07/22/2012 FINAL 07/21/2012 1432    Cardiac Enzymes:  Recent Labs Lab 04/18/14 0920 04/18/14 1502 04/18/14 2115 04/18/14 2333  TROPONINI 0.39* 0.35* <0.30 <0.30   CBG:  Recent Labs Lab 04/18/14  1158 04/18/14 1715 04/19/14 1318  GLUCAP 67* 81 72   Iron Studies: No results found for this basename: IRON, TIBC, TRANSFERRIN, FERRITIN,  in the last 72 hours @lablastinr3 @ Studies/Results: Mr Abdomen Wo Contrast  04/18/2014   CLINICAL DATA:  Right upper quadrant abdominal pain. Left lower pole renal mass.  EXAM: MRI ABDOMEN WITHOUT CONTRAST  TECHNIQUE: Multiplanar multisequence MR imaging was performed without the administration of intravenous contrast.  COMPARISON:  Abdominal ultrasound dated 04/18/2012. CT abdomen pelvis dated 03/08/2014.  FINDINGS: Cardiomegaly.  Iron deposition in the liver and spleen.  Pancreas and adrenal glands are within normal limits.  Gallbladder wall thickening/edema (series 3/image 16). Known gallstone ultrasound is not evident on MRI. No intrahepatic or  extrahepatic ductal dilatation.  Dominant 2.2 x 2.4 x 2.3 cm lesion in the anterior left lower kidney (series 3/image 14), with associated intrinsic T1 hyperintensity suggesting hemorrhage (series 7/image 36). This appearance favors a benign hemorrhagic cyst, although hemorrhage within a solid renal neoplasm remains possible.  Additional 1.7 x 1.7 x 1.6 cm lesion in the medial left upper pole (series 3/image 19), without hemorrhage, suspicious for solid renal neoplasm.  Numerous additional bilateral renal cysts of varying sizes and complexities, most of which likely reflect simple cysts, although incompletely characterized. No hydronephrosis.  No abdominal ascites.  No suspicious abdominal lymphadenopathy.  No focal osseous lesions.  IMPRESSION: 1.7 cm lesion in the medial left upper kidney, suspicious for solid renal neoplasm.  Additional 2.4 cm hemorrhagic lesion in the anterior left lower kidney, favored to reflect a benign hemorrhagic cyst, although solitary neoplasm is not excluded.  Gallbladder wall thickening/edema with known cholelithiasis, worrisome for acute cholecystitis. Consider hepatobiliary nuclear medicine scan for further evaluation as clinically warranted.  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   Electronically Signed   By: Julian Hy M.D.   On: 04/18/2014 17:27   Nm Hepatobiliary  04/18/2014   CLINICAL DATA:  Gallbladder wall thickening on ultrasound. Abdominal pain. Rule out cholecystitis.  EXAM: NUCLEAR MEDICINE HEPATOBILIARY IMAGING  TECHNIQUE: Sequential images of the abdomen were obtained out to 60 minutes following intravenous administration of radiopharmaceutical.  RADIOPHARMACEUTICALS:  5.0 mCi Tc-81m Choletec  COMPARISON:  Ultrasound 04/18/2014  FINDINGS: There is prompt uptake and excretion of radiotracer by the liver. The gallbladder fills. The biliary system empties into the small bowel.  IMPRESSION: No evidence of cystic duct or common bile duct obstruction.   Electronically  Signed   By: Rolm Baptise M.D.   On: 04/18/2014 15:40   Medications: . dextrose 5 % and 0.45% NaCl Stopped (04/19/14 1347)   . aspirin EC  325 mg Oral Once  . calcium acetate  2,001 mg Oral TID WC  . carvedilol  25 mg Oral BID WC  . darbepoetin (ARANESP) injection - DIALYSIS  25 mcg Intravenous Q Wed-HD  . doxercalciferol  1 mcg Intravenous Q M,W,F-HD  . ferric gluconate (FERRLECIT/NULECIT) IV  62.5 mg Intravenous Weekly  . heparin  5,000 Units Subcutaneous 3 times per day  . isosorbide mononitrate  60 mg Oral Daily  . multivitamin  1 tablet Oral QHS  . rOPINIRole  0.25 mg Oral QHS

## 2014-04-20 NOTE — Discharge Summary (Addendum)
Physician Discharge Summary  [name] [name] [id number] DOB: 1966/05/21 DOA: 04/18/2014  PCP: Pcp Not In System  Admit date: 04/18/2014 Discharge date: 04/21/2014  Time spent: 45 minutes  Recommendations for Outpatient Follow-up:  Patient will be discharged to home.  He is to establish care and follow up with a primary care physician within one week of discharge.  He has been given that information.  He will also need to follow up with general surgery.  Continue medications as prescribed.   Discharge Diagnoses:  Abdominal pain likely due to symptomatic cholelithiasis End stage renal disease requiring hemodialysis Hypertension Combined diastolic and systolic congestive heart failure Abnormal EKG with elevated troponin Polysubstance abuse Restless leg syndrome Anemia of chronic disease hyperkalemia  Discharge Condition: Stable  Diet recommendation: Cardiac healthy  Filed Weights   04/20/14 1701 04/20/14 2036 04/21/14 0845  Weight: 70.7 kg (155 lb 13.8 oz) 71.6 kg (157 lb 13.6 oz) 71.2 kg (156 lb 15.5 oz)    History of present illness:  [name] [name] is a 48 y.o. Male with past medical history of end-stage renal disease on dialysis (T/Thu/S), hypertension, combined diastolic and systolic congestive heart failure (EF of 40-45% on 2-D echo 07/16/12), history of substance abuse, who presented to the emergency department complaining of right upper quadrant abdominal pain for 4 days.  The patient reported that he has been having mild intermittent right upper quadrant abdominal pain in the past 6-8 months. He did not seek for treatment. Four days before admission, his abdominal pain suddenly got worse when he was walking. It was located at right upper quadrant, constant, 8/10 in severity, non-radiating. It was aggravated by walking and not alleviated by any known factors. It was associated with some mild nausea, but without vomiting, diarrhea, fever or chills. Patient did not have chest pain,  shortness of breath or palpitation. No symptoms for UTI. No hematuria.  Of note, patient was found to have a 2.9 cm left lower renal pole mass which is suspicious for renal cell carcinoma vs. Less likely benign neoplasm such as oncocytoma vs. less likely hyperdense cyst per radiologist. The patient was supposed to get MRI of his abdomen, but did not receive his appointment time yet.  Hospital Course:  Abdominal pain likely due to symptomatic cholelithiasis  -Ultrasound the abdomen: Gallbladder wall thickening could be due to acute cholecystitis. 7 mm non shadowing gallstone versus echogenic debris in the gallbladder neck  -MRI 1.7 cm lesion in the medial left upper kidney, suspicious for solid renal neoplasm, Gallbladder wall thickening/edema with known cholelithiasis, worrisome for acute cholecystitis. 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  -General surgery consulted and appreciated, recommended cholecystectomy, however not emergently due to patient's cocaine abuse, also recommended GI consult  -Spoke with GI Azucena Freed PA), no intervention as there is no duct dilatation or obstruction  -Patient was able to tolerate full diet.  End-stage renal disease requiring hemodialysis  -Patient dialyzes on Tuesday Thursday and Saturday  -Patient partially dialyzed on Tuesday due to restless leg symptoms, dialyzed today  -Nephrology consulted and appreciated   Hypertension  -Continue Coreg and Imdur  -Patient has had a history of noncompliance with medications   Combined diastolic and systolic congestive heart failure  -Echocardiogram 07/16/2012 shows an EF of [id number], grade 2 diastolic dysfunction  -Continue daily weights, strict intake and output  -Currently appears compensated   Abnormal EKG with elevated troponin  -Currently no chest pain or shortness  of breath  -Continue aspirin  -EKG: Normal sinus rhythm  -Initial  Troponin 0.39, has trended downward to 0.3  -Likely secondary to end-stage renal disease vs cocaine abuse  Polysubstance abuse  -Toxicology screen positive for cocaine  -Patient counseled   Restless leg syndrome  -Continue Requip   Anemia of chronic disease  -Stable, H/H 11.7/35.8 -Continue Aranesp, ferric gluconate with dialysis  Hyperkalemia  -Resolved, Likely due to missed dialysis, currently 3.9  Procedures: HIDA: No evidence of cystic duct or common bile duct obstruction.  Consultations: Nephrology  General surgery  Gastroenterology, via phone  Discharge Exam: Filed Vitals:   04/21/14 1000  BP: 158/118  Pulse: 78  Temp:   Resp:    Exam  General: Well developed, well nourished, NAD, appears stated age  HEENT: NCAT, mucous membranes moist.  Neck: Supple, no JVD, no masses  Cardiovascular: S1 S2 auscultated, no rubs, murmurs or gallops. Regular rate and rhythm.  Respiratory: Clear to auscultation bilaterally with equal chest rise  Abdomen: Soft, nontender, nondistended, + bowel sounds  Extremities: warm dry without cyanosis clubbing or edema  Neuro: AAOx3, no focal deficits  Skin: Without rashes exudates or nodules  Psych: Normal affect and demeanor with intact judgement and insight  Discharge Instructions      Discharge Orders   Future Appointments Provider Department Dept Phone   04/25/2014 1:00 PM [facility] [phone]   Patient to arrive 15 minutes prior to appointment time.   Future Orders Complete By Expires   Diet - low sodium heart healthy  As directed    Discharge instructions  As directed    Increase activity slowly  As directed        Medication List         aspirin 325 MG EC tablet  Take 1 tablet (325 mg total) by mouth once.     calcium acetate 667 MG capsule  Commonly known as:  PHOSLO  Take 3 capsules (2,001 mg total) by mouth 3 (three) times daily with meals.     carvedilol 12.5 MG tablet    Commonly known as:  COREG  Take 2 tablets (25 mg total) by mouth 2 (two) times daily with a meal.     isosorbide mononitrate 60 MG 24 hr tablet  Commonly known as:  IMDUR  Take 60 mg by mouth daily.     multivitamin with minerals Tabs tablet  Take 1 tablet by mouth daily.     rOPINIRole 0.25 MG tablet  Commonly known as:  REQUIP  Take 1 tablet (0.25 mg total) by mouth at bedtime.       No Known Allergies Follow-up Information   Follow up with NEWMAN,DAVID H, MD. Schedule an appointment as soon as possible for a visit in 2 weeks. [facility] followup)    Specialty:  General Surgery   Contact information:   [place] [address] [phone]       Follow up with Primary care physician. Schedule an appointment as soon as possible for a visit in 1 week. (Call this number to get a list of local doctors accepting new patients:  [phone])        The results of significant diagnostics from this hospitalization (including imaging, microbiology, ancillary and laboratory) are listed below for reference.    Significant Diagnostic Studies: Dg Chest 2 View  04/18/2014   CLINICAL DATA:  Abdominal pain for 4 days.  Chronic renal failure.  EXAM: CHEST  2 VIEW  COMPARISON:  IR SHUNTOGRAM/ FISTULAGRAM *L* dated 03/09/2014; DG CHEST 1V PORT dated 07/17/2012  FINDINGS: The heart remains enlarged. Dialysis catheter noted previously has been removed in the interim. There is mild vascular congestion but no infiltrates or failure. Similar appearance to priors otherwise. No acute osseous findings.  IMPRESSION: Cardiomegaly.  No active infiltrates.  No overt CHF.   Electronically Signed   By: Rolla Flatten M.D.   On: 04/18/2014 08:15   Mr Abdomen Wo Contrast  04/18/2014   CLINICAL DATA:  Right upper quadrant abdominal pain. Left lower pole renal mass.  EXAM: MRI ABDOMEN WITHOUT CONTRAST  TECHNIQUE: Multiplanar multisequence MR imaging was performed without the administration of  intravenous contrast.  COMPARISON:  Abdominal ultrasound dated 04/18/2012. CT abdomen pelvis dated 03/08/2014.  FINDINGS: Cardiomegaly.  Iron deposition in the liver and spleen.  Pancreas and adrenal glands are within normal limits.  Gallbladder wall thickening/edema (series 3/image 16). Known gallstone ultrasound is not evident on MRI. No intrahepatic or extrahepatic ductal dilatation.  Dominant 2.2 x 2.4 x 2.3 cm lesion in the anterior left lower kidney (series 3/image 14), with associated intrinsic T1 hyperintensity suggesting hemorrhage (series 7/image 36). This appearance favors a benign hemorrhagic cyst, although hemorrhage within a solid renal neoplasm remains possible.  Additional 1.7 x 1.7 x 1.6 cm lesion in the medial left upper pole (series 3/image 19), without hemorrhage, suspicious for solid renal neoplasm.  Numerous additional bilateral renal cysts of varying sizes and complexities, most of which likely reflect simple cysts, although incompletely characterized. No hydronephrosis.  No abdominal ascites.  No suspicious abdominal lymphadenopathy.  No focal osseous lesions.  IMPRESSION: 1.7 cm lesion in the medial left upper kidney, suspicious for solid renal neoplasm.  Additional 2.4 cm hemorrhagic lesion in the anterior left lower kidney, favored to reflect a benign hemorrhagic cyst, although solitary neoplasm is not excluded.  Gallbladder wall thickening/edema with known cholelithiasis, worrisome for acute cholecystitis. Consider hepatobiliary nuclear medicine scan for further evaluation as clinically warranted.  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   Electronically Signed   By: Julian Hy M.D.   On: 04/18/2014 17:27   Nm Hepatobiliary  04/18/2014   CLINICAL DATA:  Gallbladder wall thickening on ultrasound. Abdominal pain. Rule out cholecystitis.  EXAM: NUCLEAR MEDICINE HEPATOBILIARY IMAGING  TECHNIQUE: Sequential images of the abdomen were obtained out to 60 minutes following intravenous  administration of radiopharmaceutical.  RADIOPHARMACEUTICALS:  5.0 mCi Tc-45m Choletec  COMPARISON:  Ultrasound 04/18/2014  FINDINGS: There is prompt uptake and excretion of radiotracer by the liver. The gallbladder fills. The biliary system empties into the small bowel.  IMPRESSION: No evidence of cystic duct or common bile duct obstruction.   Electronically Signed   By: Rolm Baptise M.D.   On: 04/18/2014 15:40   US Abdomen Complete  04/18/2014   CLINICAL DATA:  Abdominal pain.  EXAM: ULTRASOUND ABDOMEN COMPLETE  COMPARISON:  CT scan 03/08/2014 and renal ultrasound, same date.  FINDINGS: Gallbladder:  The gallbladder wall is thickened measuring up to 5 mm. No pericholecystic fluid. There is a 7 mm echogenic structure in the gallbladder neck. No acoustic shadowing is demonstrated. This could be an non shadowing gallstone or echogenic debris.  Common bile duct:  Diameter: 6.1 mm  Liver:  Normal echogenicity without focal lesion or biliary dilatation.  IVC:  Normal caliber.  Pancreas:  Sonographically unremarkable.  Spleen:  Size and appearance within normal limits.  Right Kidney:  Length: 7.0 cm. Increased echogenicity suggesting medical  renal disease. There is a rounded 13 x 13 mm exophytic lesion involving the upper pole which could represent a complex cyst or solid nodule. A small upper pole cyst is also noted. This measures approximately 9 mm.  Left Kidney:  Length: 7.1 cm. Increased echogenicity suggesting medical renal disease. As demonstrated on the prior CT scan there is a 2.2 cm lesion projecting off the lower pole which could be a hemorrhagic cyst or mass.  Abdominal aorta:  No aneurysm visualized.  Other findings:  None.  IMPRESSION: 1. Gallbladder wall thickening could be due to acute cholecystitis. 7 mm non shadowing gallstone versus echogenic debris in the gallbladder neck. Borderline common bile duct. Nuclear medicine hepatobiliary scan may be helpful for further evaluation. 2. Small kidneys with  increased echogenicity consistent with end-stage renal disease. 3. Bilateral renal lesions are indeterminate. MR imaging is suggested (without contrast given renal function).   Electronically Signed   By: Kalman Jewels M.D.   On: 04/18/2014 08:32    Microbiology: No results found for this or any previous visit (from the past 240 hour(s)).   Labs: Basic Metabolic Panel:  Recent Labs Lab 04/18/14 0550 04/19/14 0500 04/19/14 1020 04/20/14 0606 04/21/14 0900  NA 141 136*  --  140 137  K 5.2 6.7* 3.2* 3.9 3.9  CL 95* 94*  --  94* 93*  CO2 26 19  --  25 27  GLUCOSE 93 108*  --  86 114*  BUN 82* 98*  --  51* 35*  CREATININE 13.10* 15.13*  --  10.65* 8.99*  CALCIUM 8.8 7.7*  --  8.4 8.9   Liver Function Tests:  Recent Labs Lab 04/18/14 0550  AST 27  ALT 45  ALKPHOS 49  BILITOT 0.3  PROT 7.4  ALBUMIN 3.4*    Recent Labs Lab 04/18/14 0550  LIPASE 27   No results found for this basename: AMMONIA,  in the last 168 hours CBC:  Recent Labs Lab 04/18/14 0550 04/19/14 0500 04/20/14 0606 04/21/14 0900  WBC 5.7 6.1 5.9 6.1  NEUTROABS 3.0  --   --   --   HGB 9.5* 9.7* 11.0* 11.7*  HCT 29.4* 29.3* 33.4* 35.8*  MCV 93.0 92.4 93.6 93.2  PLT 221 223 211 214   Cardiac Enzymes:  Recent Labs Lab 04/18/14 0920 04/18/14 1502 04/18/14 2115 04/18/14 2333  TROPONINI 0.39* 0.35* <0.30 <0.30   BNP: BNP (last 3 results) No results found for this basename: PROBNP,  in the last 8760 hours CBG:  Recent Labs Lab 04/18/14 1158 04/18/14 1715 04/19/14 1318  GLUCAP 67* 81 72       Signed:  Valma Rotenberg  [facility] 04/21/2014, 10:40 AM

## 2014-04-20 NOTE — Progress Notes (Signed)
[facility]                                                                              Patient Demographics  [name] [name], is a 48 y.o. male, DOB - 1966-05-31, [id number]  Admit date - 04/18/2014   Admitting Physician [name] Cousins, MD  Outpatient Primary MD for the patient is [name] [name]  LOS - 2   Chief Complaint  Patient presents with  . Abdominal Pain  . Back Pain        Assessment & Plan   Abdominal pain likely due to symptomatic cholelithiasis -Ultrasound the abdomen: Gallbladder wall thickening could be due to acute cholecystitis. 7 mm non shadowing gallstone versus echogenic debris in the gallbladder neck -MRI 1.7 cm lesion in the medial left upper kidney, suspicious for solid renal neoplasm, Gallbladder wall thickening/edema with known cholelithiasis, worrisome for acute cholecystitis. 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 -General surgery consulted and appreciated, recommended cholecystectomy, however not emergently due to patient's cocaine abuse, also recommended GI consult -Spoke with GI [name] Freed PA), no intervention as there is no duct dilatation or obstruction -Will advance diet to full liquid and as tolerated  End-stage renal disease requiring hemodialysis -Patient dialyzes on Tuesday Thursday and Saturday -Patient partially dialyzed on Tuesday due to restless leg symptoms, dialyzed today -Nephrology consulted and appreciated  Hypertension -Continue Coreg and Imdur  -Patient has had a history of noncompliance with medications  Combined diastolic and systolic congestive heart failure -Echocardiogram 07/16/2012 shows an EF of [id number], grade 2 diastolic dysfunction -Continue daily weights, strict intake and output -Currently appears compensated  Abnormal EKG with elevated troponin -Currently no chest pain or shortness of breath -Continue aspirin -EKG: Normal  sinus rhythm -Initial Troponin 0.39, has trended downward to 0.3 -Likely secondary to end-stage renal disease  Polysubstance abuse -Toxicology screen positive for cocaine -Patient counseled  Restless leg syndrome -Continue Requip  Anemia of chronic disease -Stable, H/H 11/33.4 -Continue Aranesp, ferric gluconate -Continue to monitor CBC  Hyperkalemia -Resolved, Likely due to missed dialysis, currently 3.9  Code Status: Full  Family Communication: None at bedside  Disposition Plan: Admitted, will advance diet as tolerated. Likely discharge to home 5/8  Time Spent in minutes   25 minutes  Procedures  HIDA: No evidence of cystic duct or common bile duct obstruction.  Consults   Nephrology General surgery Gastroenterology, via phone  DVT Prophylaxis  Heparin  Lab Results  Component Value Date   PLT 211 04/20/2014    Medications  Scheduled Meds: . aspirin EC  325 mg Oral Once  . calcium acetate  2,001 mg Oral TID WC  . carvedilol  25 mg Oral BID WC  . darbepoetin (ARANESP) injection - DIALYSIS  25 mcg Intravenous Q Wed-HD  . doxercalciferol  1 mcg Intravenous Q M,W,F-HD  . ferric gluconate (FERRLECIT/NULECIT) IV  62.5 mg Intravenous Weekly  . heparin  5,000 Units Subcutaneous 3 times per day  . isosorbide mononitrate  60 mg Oral Daily  . multivitamin  1 tablet Oral QHS  . rOPINIRole  0.25 mg Oral QHS   Continuous  Infusions: . dextrose 5 % and 0.45% NaCl Stopped (04/19/14 1347)   PRN Meds:.acetaminophen, acetaminophen, morphine injection, promethazine  Antibiotics    Anti-infectives   None      Subjective:   [name] [name] seen and examined today.  Patient no longer complains of nausea.  States his abdominal pain has improved.  Denies chest pain, shortness of breath.  Objective:   Filed Vitals:   04/19/14 1300 04/19/14 1722 04/19/14 2100 04/20/14 0500  BP: 126/76 142/96 137/90 182/117  Pulse: 75 73 70 77  Temp: 99.8 F (37.7 C) 98.8 F (37.1 C)  99.2 F (37.3 C) 98.3 F (36.8 C)  TempSrc: Oral Oral Oral Oral  Resp: 17 16 16 16   Height:      Weight:   72 kg (158 lb 11.7 oz)   SpO2: 98% 100% 94% 100%    Wt Readings from Last 3 Encounters:  04/19/14 72 kg (158 lb 11.7 oz)  03/09/14 69.4 kg (153 lb)  12/12/13 72.576 kg (160 lb)     Intake/Output Summary (Last 24 hours) at 04/20/14 0820 Last data filed at 04/19/14 1500  Gross per 24 hour  Intake    240 ml  Output   3935 ml  Net  -3695 ml    Exam  General: Well developed, well nourished, NAD, appears stated age  HEENT: NCAT, mucous membranes moist.   Neck: Supple, no JVD, no masses  Cardiovascular: S1 S2 auscultated, no rubs, murmurs or gallops. Regular rate and rhythm.  Respiratory: Clear to auscultation bilaterally with equal chest rise  Abdomen: Soft, nontender, nondistended, + bowel sounds  Extremities: warm dry without cyanosis clubbing or edema  Neuro: AAOx3, no focal deficits  Skin: Without rashes exudates or nodules  Psych: Normal affect and demeanor with intact judgement and insight  Data Review   Micro Results No results found for this or any previous visit (from the past 240 hour(s)).  Radiology Reports Dg Chest 2 View  04/18/2014   CLINICAL DATA:  Abdominal pain for 4 days.  Chronic renal failure.  EXAM: CHEST  2 VIEW  COMPARISON:  IR SHUNTOGRAM/ FISTULAGRAM *L* dated 03/09/2014; DG CHEST 1V PORT dated 07/17/2012  FINDINGS: The heart remains enlarged. Dialysis catheter noted previously has been removed in the interim. There is mild vascular congestion but no infiltrates or failure. Similar appearance to priors otherwise. No acute osseous findings.  IMPRESSION: Cardiomegaly.  No active infiltrates.  No overt CHF.   Electronically Signed   By: Rolla Flatten M.D.   On: 04/18/2014 08:15   Mr Abdomen Wo Contrast  04/18/2014   CLINICAL DATA:  Right upper quadrant abdominal pain. Left lower pole renal mass.  EXAM: MRI ABDOMEN WITHOUT CONTRAST  TECHNIQUE:  Multiplanar multisequence MR imaging was performed without the administration of intravenous contrast.  COMPARISON:  Abdominal ultrasound dated 04/18/2012. CT abdomen pelvis dated 03/08/2014.  FINDINGS: Cardiomegaly.  Iron deposition in the liver and spleen.  Pancreas and adrenal glands are within normal limits.  Gallbladder wall thickening/edema (series 3/image 16). Known gallstone ultrasound is not evident on MRI. No intrahepatic or extrahepatic ductal dilatation.  Dominant 2.2 x 2.4 x 2.3 cm lesion in the anterior left lower kidney (series 3/image 14), with associated intrinsic T1 hyperintensity suggesting hemorrhage (series 7/image 36). This appearance favors a benign hemorrhagic cyst, although hemorrhage within a solid renal neoplasm remains possible.  Additional 1.7 x 1.7 x 1.6 cm lesion in the medial left upper pole (series 3/image 19), without hemorrhage, suspicious for solid renal  neoplasm.  Numerous additional bilateral renal cysts of varying sizes and complexities, most of which likely reflect simple cysts, although incompletely characterized. No hydronephrosis.  No abdominal ascites.  No suspicious abdominal lymphadenopathy.  No focal osseous lesions.  IMPRESSION: 1.7 cm lesion in the medial left upper kidney, suspicious for solid renal neoplasm.  Additional 2.4 cm hemorrhagic lesion in the anterior left lower kidney, favored to reflect a benign hemorrhagic cyst, although solitary neoplasm is not excluded.  Gallbladder wall thickening/edema with known cholelithiasis, worrisome for acute cholecystitis. Consider hepatobiliary nuclear medicine scan for further evaluation as clinically warranted.  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   Electronically Signed   By: Julian Hy M.D.   On: 04/18/2014 17:27   Nm Hepatobiliary  04/18/2014   CLINICAL DATA:  Gallbladder wall thickening on ultrasound. Abdominal pain. Rule out cholecystitis.  EXAM: NUCLEAR MEDICINE HEPATOBILIARY IMAGING  TECHNIQUE:  Sequential images of the abdomen were obtained out to 60 minutes following intravenous administration of radiopharmaceutical.  RADIOPHARMACEUTICALS:  5.0 mCi Tc-66m Choletec  COMPARISON:  Ultrasound 04/18/2014  FINDINGS: There is prompt uptake and excretion of radiotracer by the liver. The gallbladder fills. The biliary [name] empties into the small bowel.  IMPRESSION: No evidence of cystic duct or common bile duct obstruction.   Electronically Signed   By: Rolm Baptise M.D.   On: 04/18/2014 15:40   US Abdomen Complete  04/18/2014   CLINICAL DATA:  Abdominal pain.  EXAM: ULTRASOUND ABDOMEN COMPLETE  COMPARISON:  CT scan 03/08/2014 and renal ultrasound, same date.  FINDINGS: Gallbladder:  The gallbladder wall is thickened measuring up to 5 mm. No pericholecystic fluid. There is a 7 mm echogenic structure in the gallbladder neck. No acoustic shadowing is demonstrated. This could be an non shadowing gallstone or echogenic debris.  Common bile duct:  Diameter: 6.1 mm  Liver:  Normal echogenicity without focal lesion or biliary dilatation.  IVC:  Normal caliber.  Pancreas:  Sonographically unremarkable.  Spleen:  Size and appearance within normal limits.  Right Kidney:  Length: 7.0 cm. Increased echogenicity suggesting medical renal disease. There is a rounded 13 x 13 mm exophytic lesion involving the upper pole which could represent a complex cyst or solid nodule. A small upper pole cyst is also noted. This measures approximately 9 mm.  Left Kidney:  Length: 7.1 cm. Increased echogenicity suggesting medical renal disease. As demonstrated on the prior CT scan there is a 2.2 cm lesion projecting off the lower pole which could be a hemorrhagic cyst or mass.  Abdominal aorta:  No aneurysm visualized.  Other findings:  None.  IMPRESSION: 1. Gallbladder wall thickening could be due to acute cholecystitis. 7 mm non shadowing gallstone versus echogenic debris in the gallbladder neck. Borderline common bile duct. Nuclear  medicine hepatobiliary scan may be helpful for further evaluation. 2. Small kidneys with increased echogenicity consistent with end-stage renal disease. 3. Bilateral renal lesions are indeterminate. MR imaging is suggested (without contrast given renal function).   Electronically Signed   By: Kalman Jewels M.D.   On: 04/18/2014 08:32    CBC  Recent Labs Lab 04/18/14 0550 04/19/14 0500 04/20/14 0606  WBC 5.7 6.1 5.9  HGB 9.5* 9.7* 11.0*  HCT 29.4* 29.3* 33.4*  PLT 221 223 211  MCV 93.0 92.4 93.6  MCH 30.1 30.6 30.8  MCHC 32.3 33.1 32.9  RDW 14.6 14.5 14.6  LYMPHSABS 1.9  --   --   MONOABS 0.4  --   --   EOSABS  0.4  --   --   BASOSABS 0.0  --   --     Chemistries   Recent Labs Lab 04/18/14 0550 04/19/14 0500 04/19/14 1020 04/20/14 0606  NA 141 136*  --  140  K 5.2 6.7* 3.2* 3.9  CL 95* 94*  --  94*  CO2 26 19  --  25  GLUCOSE 93 108*  --  86  BUN 82* 98*  --  51*  CREATININE 13.10* 15.13*  --  10.65*  CALCIUM 8.8 7.7*  --  8.4  AST 27  --   --   --   ALT 45  --   --   --   ALKPHOS 49  --   --   --   BILITOT 0.3  --   --   --    ------------------------------------------------------------------------------------------------------------------ estimated creatinine clearance is 8.8 ml/min (by C-G formula based on Cr of 10.65). ------------------------------------------------------------------------------------------------------------------ No results found for this basename: HGBA1C,  in the last 72 hours ------------------------------------------------------------------------------------------------------------------ No results found for this basename: CHOL, HDL, LDLCALC, TRIG, CHOLHDL, LDLDIRECT,  in the last 72 hours ------------------------------------------------------------------------------------------------------------------ No results found for this basename: TSH, T4TOTAL, FREET3, T3FREE, THYROIDAB,  in the last 72  hours ------------------------------------------------------------------------------------------------------------------ No results found for this basename: VITAMINB12, FOLATE, FERRITIN, TIBC, IRON, RETICCTPCT,  in the last 72 hours  Coagulation profile  Recent Labs Lab 04/18/14 1502  INR 1.09    No results found for this basename: DDIMER,  in the last 72 hours  Cardiac Enzymes  Recent Labs Lab 04/18/14 1502 04/18/14 2115 04/18/14 2333  TROPONINI 0.35* <0.30 <0.30   ------------------------------------------------------------------------------------------------------------------ No components found with this basename: POCBNP,     Tajuanna Burnett D.O. on 04/20/2014 at 8:20 AM  Between 7am to 7pm - Pager - [phone]  After 7pm go to [email or web address] - password [email or web address]  And look for the night coverage person covering for me after hours  [facility] Office  [phone]

## 2014-04-21 DIAGNOSIS — I120 Hypertensive chronic kidney disease with stage 5 chronic kidney disease or end stage renal disease: Secondary | ICD-10-CM | POA: Diagnosis not present

## 2014-04-21 DIAGNOSIS — N2581 Secondary hyperparathyroidism of renal origin: Secondary | ICD-10-CM | POA: Diagnosis not present

## 2014-04-21 DIAGNOSIS — D631 Anemia in chronic kidney disease: Secondary | ICD-10-CM | POA: Diagnosis not present

## 2014-04-21 DIAGNOSIS — N186 End stage renal disease: Secondary | ICD-10-CM | POA: Diagnosis not present

## 2014-04-21 LAB — CBC
HCT: 35.8 % — ABNORMAL LOW (ref 39.0–52.0)
Hemoglobin: 11.7 g/dL — ABNORMAL LOW (ref 13.0–17.0)
MCH: 30.5 pg (ref 26.0–34.0)
MCHC: 32.7 g/dL (ref 30.0–36.0)
MCV: 93.2 fL (ref 78.0–100.0)
PLATELETS: 214 10*3/uL (ref 150–400)
RBC: 3.84 MIL/uL — AB (ref 4.22–5.81)
RDW: 14.6 % (ref 11.5–15.5)
WBC: 6.1 10*3/uL (ref 4.0–10.5)

## 2014-04-21 LAB — BASIC METABOLIC PANEL
BUN: 35 mg/dL — ABNORMAL HIGH (ref 6–23)
CHLORIDE: 93 meq/L — AB (ref 96–112)
CO2: 27 mEq/L (ref 19–32)
Calcium: 8.9 mg/dL (ref 8.4–10.5)
Creatinine, Ser: 8.99 mg/dL — ABNORMAL HIGH (ref 0.50–1.35)
GFR calc Af Amer: 7 mL/min — ABNORMAL LOW (ref >90)
GFR calc non Af Amer: 6 mL/min — ABNORMAL LOW (ref >90)
Glucose, Bld: 114 mg/dL — ABNORMAL HIGH (ref 70–99)
POTASSIUM: 3.9 meq/L (ref 3.7–5.3)
Sodium: 137 mEq/L (ref 137–147)

## 2014-04-21 MED ORDER — HEPARIN SODIUM (PORCINE) 1000 UNIT/ML DIALYSIS: 5000.0000 [IU] | Freq: Once | INTRAMUSCULAR | Status: DC

## 2014-04-21 MED ORDER — SODIUM CHLORIDE 0.9 % IV SOLN: 100.0000 mL | INTRAVENOUS | Status: DC | PRN

## 2014-04-21 MED ORDER — LIDOCAINE-PRILOCAINE 2.5-2.5 % EX CREA: 1.0000 "application " | TOPICAL_CREAM | CUTANEOUS | Status: DC | PRN

## 2014-04-21 MED ORDER — HEPARIN SODIUM (PORCINE) 1000 UNIT/ML DIALYSIS: 1000.0000 [IU] | INTRAMUSCULAR | Status: DC | PRN

## 2014-04-21 MED ORDER — LIDOCAINE HCL (PF) 1 % IJ SOLN: 5.0000 mL | INTRAMUSCULAR | Status: DC | PRN

## 2014-04-21 MED ORDER — HEPARIN SODIUM (PORCINE) 1000 UNIT/ML DIALYSIS
5000.0000 [IU] | Freq: Once | INTRAMUSCULAR | Status: DC
  Filled 2014-04-21: qty 5

## 2014-04-21 MED ORDER — ALTEPLASE 2 MG IJ SOLR: 2.0000 mg | Freq: Once | INTRAMUSCULAR | Status: DC | PRN

## 2014-04-21 MED ORDER — NEPRO/CARBSTEADY PO LIQD: 237.0000 mL | ORAL | Status: DC | PRN

## 2014-04-21 MED ORDER — CALCIUM ACETATE 667 MG PO CAPS: 2001.0000 mg | ORAL_CAPSULE | Freq: Three times a day (TID) | ORAL | Status: DC

## 2014-04-21 MED ORDER — DOXERCALCIFEROL 4 MCG/2ML IV SOLN
INTRAVENOUS | Status: AC
  Filled 2014-04-21: qty 2

## 2014-04-21 MED ORDER — ROPINIROLE HCL 0.25 MG PO TABS: 0.2500 mg | ORAL_TABLET | Freq: Every day | ORAL | Status: DC

## 2014-04-21 MED ORDER — HEPARIN SODIUM (PORCINE) 1000 UNIT/ML IJ SOLN
5000.0000 [IU] | Freq: Once | INTRAMUSCULAR | Status: AC
  Administered 2014-04-21: 5000 [IU] via INTRAVENOUS

## 2014-04-21 MED ORDER — PENTAFLUOROPROP-TETRAFLUOROETH EX AERO
1.0000 | INHALATION_SPRAY | CUTANEOUS | Status: DC | PRN
Start: 2014-04-21 — End: 2014-04-21

## 2014-04-21 MED ORDER — PENTAFLUOROPROP-TETRAFLUOROETH EX AERO: 1.0000 "application " | INHALATION_SPRAY | CUTANEOUS | Status: DC | PRN

## 2014-04-21 MED ORDER — ASPIRIN 325 MG PO TBEC: 325.0000 mg | DELAYED_RELEASE_TABLET | Freq: Once | ORAL | Status: DC

## 2014-04-21 MED ORDER — HEPARIN SODIUM (PORCINE) 1000 UNIT/ML DIALYSIS
1000.0000 [IU] | INTRAMUSCULAR | Status: DC | PRN
  Filled 2014-04-21: qty 1

## 2014-04-21 MED ORDER — ALTEPLASE 2 MG IJ SOLR
2.0000 mg | Freq: Once | INTRAMUSCULAR | Status: DC | PRN
  Filled 2014-04-21: qty 2

## 2014-04-21 NOTE — Progress Notes (Signed)
Explained discharge instructions including medications, upcoming appointment, and precautions.  Pt verbalized understanding of all items discussed.  Pt discharged in stable condition.  Thressa Sheller, RN, RS

## 2014-04-21 NOTE — Discharge Instructions (Signed)
Cholelithiasis °Cholelithiasis (also called gallstones) is a form of gallbladder disease in which gallstones form in your gallbladder. The gallbladder is an organ that stores bile made in the liver, which helps digest fats. Gallstones begin as small crystals and slowly grow into stones. Gallstone pain occurs when the gallbladder spasms and a gallstone is blocking the duct. Pain can also occur when a stone passes out of the duct.  °RISK FACTORS °· Being male.   °· Having multiple pregnancies. Health care providers sometimes advise removing diseased gallbladders before future pregnancies.   °· Being obese. °· Eating a diet heavy in fried foods and fat.   °· Being older than 60 years and increasing age.   °· Prolonged use of medicines containing male hormones.   °· Having diabetes mellitus.   °· Rapidly losing weight.   °· Having a family history of gallstones (heredity).   °SYMPTOMS °· Nausea.   °· Vomiting. °· Abdominal pain.   °· Yellowing of the skin (jaundice).   °· Sudden pain. It may persist from several minutes to several hours. °· Fever.   °· Tenderness to the touch.  °In some cases, when gallstones do not move into the bile duct, people have no pain or symptoms. These are called "silent" gallstones.  °TREATMENT °Silent gallstones do not need treatment. In severe cases, emergency surgery may be required. Options for treatment include: °· Surgery to remove the gallbladder. This is the most common treatment. °· Medicines. These do not always work and may take 6 12 months or more to work. °· Shock wave treatment (extracorporeal biliary lithotripsy). In this treatment an ultrasound machine sends shock waves to the gallbladder to break gallstones into smaller pieces that can pass into the intestines or be dissolved by medicine. °HOME CARE INSTRUCTIONS  °· Only take over-the-counter or prescription medicines for pain, discomfort, or fever as directed by your health care provider.   °· Follow a low-fat diet until  seen again by your health care provider. Fat causes the gallbladder to contract, which can result in pain.   °· Follow up with your health care provider as directed. Attacks are almost always recurrent and surgery is usually required for permanent treatment.   °SEEK IMMEDIATE MEDICAL CARE IF:  °· Your pain increases and is not controlled by medicines.   °· You have a fever or persistent symptoms for more than 2 3 days.   °· You have a fever and your symptoms suddenly get worse.   °· You have persistent nausea and vomiting.   °MAKE SURE YOU:  °· Understand these instructions. °· Will watch your condition. °· Will get help right away if you are not doing well or get worse. °Document Released: 11/27/2005 Document Revised: 08/03/2013 Document Reviewed: 05/25/2013 °[employer]® Patient Information ©2014 [employer]. ° °

## 2014-04-21 NOTE — ED Provider Notes (Signed)
Medical screening examination/treatment/procedure(s) were performed by non-physician practitioner and as supervising physician I was immediately available for consultation/collaboration.   EKG Interpretation   Date/Time:  Tuesday Apr 18 2014 05:21:20 EDT Ventricular Rate:  72 PR Interval:  174 QRS Duration: 98 QT Interval:  458 QTC Calculation: 501 R Axis:   -53 Text Interpretation:  Normal sinus rhythm Possible Left atrial enlargement  Left axis deviation T wave abnormality, consider lateral ischemia  Prolonged QT Abnormal ECG Rate is Slower Confirmed by Florina Ou  MD, [name]  [phone]) on 04/18/2014 7:37:32 AM        Wynetta Fines, MD 04/21/14 2252

## 2014-04-21 NOTE — Care Management Note (Signed)
CARE MANAGEMENT NOTE 04/21/2014  Patient:  [name] [name],[name] [name]   Account Number:  [id number]  Date Initiated:  04/21/2014  Documentation initiated by:  Morrie Daywalt  Subjective/Objective Assessment:     Action/Plan:   No d/c needs identified.   Anticipated DC Date:  04/21/2014   Anticipated DC Plan:  HOME/SELF CARE         Choice offered to / List presented to:             Status of service:  Completed, signed off Medicare Important Message given?   (If response is "NO", the following Medicare IM given date fields will be blank) Date Medicare IM given:  04/21/2014 Date Additional Medicare IM given:    Discharge Disposition:  HOME/SELF CARE  Per UR Regulation:    If discussed at Long Length of Stay Meetings, dates discussed:    Comments:  04/21/2014 IM letter prepared for pt however when pt returned from hemodialysis, he left quickly and was unavailable to sign the IM. Jasmine Pang RN MPH, case manager, [phone]

## 2014-04-21 NOTE — Procedures (Signed)
I was present at this dialysis session, have reviewed the session itself and made  appropriate changes  Kelly Splinter MD (pgr) [phone]    ([phone] 04/21/2014, 11:36 AM

## 2014-04-21 NOTE — Progress Notes (Signed)
Subjective:  Seen on dialysis, no complaints, no restless legs with med  Objective: Vital signs in last 24 hours: Temp:  [98.1 F (36.7 C)-99.4 F (37.4 C)] 98.2 F (36.8 C) (05/08 0845) Pulse Rate:  [69-82] 77 (05/08 1030) Resp:  [18-20] 19 (05/08 0845) BP: (134-173)/(88-120) 141/95 mmHg (05/08 1030) SpO2:  [96 %-100 %] 96 % (05/08 0848) Weight:  [70.7 kg (155 lb 13.8 oz)-72.5 kg (159 lb 13.3 oz)] 71.2 kg (156 lb 15.5 oz) (05/08 0845) Weight change: 0.3 kg (10.6 oz)  Intake/Output from previous day: 05/07 0701 - 05/08 0700 In: 240 [P.O.:240] Out: 1260    Lab Results:  Recent Labs  04/20/14 0606 04/21/14 0900  WBC 5.9 6.1  HGB 11.0* 11.7*  HCT 33.4* 35.8*  PLT 211 214   BMET:  Recent Labs  04/20/14 0606 04/21/14 0900  NA 140 137  K 3.9 3.9  CL 94* 93*  CO2 25 27  GLUCOSE 86 114*  BUN 51* 35*  CREATININE 10.65* 8.99*  CALCIUM 8.4 8.9   No results found for this basename: PTH,  in the last 72 hours Iron Studies: No results found for this basename: IRON, TIBC, TRANSFERRIN, FERRITIN,  in the last 72 hours  Studies/Results: No results found.  EXAM: General appearance:  Alert, in no apparent distress Resp:  CTA without rales, rhonchi, or wheezes Cardio:  RRR without murmur or rub GI:  + BS, soft and nontender Extremities:  No edema Access:  AVF @ LUA with BFR 400  Dialysis Orders: MWF [facility]  70.5kg 4hr 160 400/1.5 2K /2.5Ca LAVF Heparin 5000  hectorol 1 mcg IV/HD Epogen 1200 Units IV/HD Venofer 50 week  Assessment/Plan: 1. Abdominal pain - initially suspected cholecystitis, but HIDA negative; now resolved, possibly related to uremia. 2. Elevated troponin - trending down, no chest pain. 3. Restless leg syndrome - much better on Requip. 4. Renal mass - ? RCC, outpatient follow-up next week. 5. ESRD - HD on MWF @ [facility], K 3.9.  HD 3rd straight day. 6. HTN/Volume - BP 164/101 on Carvedilol; wt 71.2 kg s/p net UF 1.2 L yesterday, goal only 0.7 today.  May require  increase with EDW. 7. Anemia - Hgb 11.7 on Aranesp 25 mcg on Wed. 8. 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 9. Nutrition - Alb 3.4, vitamin. 10. Substance abuse - cocaine +.   LOS: 3 days   Ramiro Harvest 04/21/2014,10:57 AM  Pt seen, examined and agree w A/P as above. Ready for d/c, have d/w primary MD.  Kelly Splinter MD pager [phone]    cell [phone] 04/21/2014, 11:33 AM

## 2014-04-22 NOTE — H&P (Signed)
-   Agree with plan as above, I have d/w in details management and finding. - Abd U/S showed : Gallbladder wall thickening could be due to acute cholecystitis.  7 mm non shadowing gallstone, obtain HIDA scan.

## 2014-04-24 DIAGNOSIS — D509 Iron deficiency anemia, unspecified: Secondary | ICD-10-CM | POA: Diagnosis not present

## 2014-04-24 DIAGNOSIS — N186 End stage renal disease: Secondary | ICD-10-CM | POA: Diagnosis not present

## 2014-04-24 DIAGNOSIS — Z992 Dependence on renal dialysis: Secondary | ICD-10-CM | POA: Diagnosis not present

## 2014-04-24 DIAGNOSIS — N2581 Secondary hyperparathyroidism of renal origin: Secondary | ICD-10-CM | POA: Diagnosis not present

## 2014-04-24 DIAGNOSIS — N039 Chronic nephritic syndrome with unspecified morphologic changes: Secondary | ICD-10-CM | POA: Diagnosis not present

## 2014-04-25 ENCOUNTER — Ambulatory Visit (HOSPITAL_COMMUNITY): Admission: RE | Admit: 2014-04-25 | Payer: Medicare Other | Source: Ambulatory Visit

## 2014-04-26 DIAGNOSIS — N186 End stage renal disease: Secondary | ICD-10-CM | POA: Diagnosis not present

## 2014-04-26 DIAGNOSIS — D631 Anemia in chronic kidney disease: Secondary | ICD-10-CM | POA: Diagnosis not present

## 2014-04-26 DIAGNOSIS — Z992 Dependence on renal dialysis: Secondary | ICD-10-CM | POA: Diagnosis not present

## 2014-04-26 DIAGNOSIS — N2581 Secondary hyperparathyroidism of renal origin: Secondary | ICD-10-CM | POA: Diagnosis not present

## 2014-04-26 DIAGNOSIS — D509 Iron deficiency anemia, unspecified: Secondary | ICD-10-CM | POA: Diagnosis not present

## 2014-04-28 DIAGNOSIS — D509 Iron deficiency anemia, unspecified: Secondary | ICD-10-CM | POA: Diagnosis not present

## 2014-04-28 DIAGNOSIS — Z992 Dependence on renal dialysis: Secondary | ICD-10-CM | POA: Diagnosis not present

## 2014-04-28 DIAGNOSIS — D631 Anemia in chronic kidney disease: Secondary | ICD-10-CM | POA: Diagnosis not present

## 2014-04-28 DIAGNOSIS — N186 End stage renal disease: Secondary | ICD-10-CM | POA: Diagnosis not present

## 2014-04-28 DIAGNOSIS — N2581 Secondary hyperparathyroidism of renal origin: Secondary | ICD-10-CM | POA: Diagnosis not present

## 2014-05-01 DIAGNOSIS — D631 Anemia in chronic kidney disease: Secondary | ICD-10-CM | POA: Diagnosis not present

## 2014-05-01 DIAGNOSIS — Z992 Dependence on renal dialysis: Secondary | ICD-10-CM | POA: Diagnosis not present

## 2014-05-01 DIAGNOSIS — N2581 Secondary hyperparathyroidism of renal origin: Secondary | ICD-10-CM | POA: Diagnosis not present

## 2014-05-01 DIAGNOSIS — D509 Iron deficiency anemia, unspecified: Secondary | ICD-10-CM | POA: Diagnosis not present

## 2014-05-01 DIAGNOSIS — N186 End stage renal disease: Secondary | ICD-10-CM | POA: Diagnosis not present

## 2014-05-03 DIAGNOSIS — N2581 Secondary hyperparathyroidism of renal origin: Secondary | ICD-10-CM | POA: Diagnosis not present

## 2014-05-03 DIAGNOSIS — D509 Iron deficiency anemia, unspecified: Secondary | ICD-10-CM | POA: Diagnosis not present

## 2014-05-03 DIAGNOSIS — D631 Anemia in chronic kidney disease: Secondary | ICD-10-CM | POA: Diagnosis not present

## 2014-05-03 DIAGNOSIS — Z992 Dependence on renal dialysis: Secondary | ICD-10-CM | POA: Diagnosis not present

## 2014-05-03 DIAGNOSIS — N186 End stage renal disease: Secondary | ICD-10-CM | POA: Diagnosis not present

## 2014-05-08 DIAGNOSIS — D631 Anemia in chronic kidney disease: Secondary | ICD-10-CM | POA: Diagnosis not present

## 2014-05-08 DIAGNOSIS — N186 End stage renal disease: Secondary | ICD-10-CM | POA: Diagnosis not present

## 2014-05-08 DIAGNOSIS — N2581 Secondary hyperparathyroidism of renal origin: Secondary | ICD-10-CM | POA: Diagnosis not present

## 2014-05-08 DIAGNOSIS — D509 Iron deficiency anemia, unspecified: Secondary | ICD-10-CM | POA: Diagnosis not present

## 2014-05-08 DIAGNOSIS — Z992 Dependence on renal dialysis: Secondary | ICD-10-CM | POA: Diagnosis not present

## 2014-05-10 DIAGNOSIS — Z992 Dependence on renal dialysis: Secondary | ICD-10-CM | POA: Diagnosis not present

## 2014-05-10 DIAGNOSIS — D509 Iron deficiency anemia, unspecified: Secondary | ICD-10-CM | POA: Diagnosis not present

## 2014-05-10 DIAGNOSIS — N2581 Secondary hyperparathyroidism of renal origin: Secondary | ICD-10-CM | POA: Diagnosis not present

## 2014-05-10 DIAGNOSIS — N186 End stage renal disease: Secondary | ICD-10-CM | POA: Diagnosis not present

## 2014-05-10 DIAGNOSIS — N039 Chronic nephritic syndrome with unspecified morphologic changes: Secondary | ICD-10-CM | POA: Diagnosis not present

## 2014-05-13 DIAGNOSIS — D631 Anemia in chronic kidney disease: Secondary | ICD-10-CM | POA: Diagnosis not present

## 2014-05-13 DIAGNOSIS — D509 Iron deficiency anemia, unspecified: Secondary | ICD-10-CM | POA: Diagnosis not present

## 2014-05-13 DIAGNOSIS — Z992 Dependence on renal dialysis: Secondary | ICD-10-CM | POA: Diagnosis not present

## 2014-05-13 DIAGNOSIS — N186 End stage renal disease: Secondary | ICD-10-CM | POA: Diagnosis not present

## 2014-05-13 DIAGNOSIS — N2581 Secondary hyperparathyroidism of renal origin: Secondary | ICD-10-CM | POA: Diagnosis not present

## 2014-05-14 DIAGNOSIS — N186 End stage renal disease: Secondary | ICD-10-CM | POA: Diagnosis not present

## 2014-05-15 DIAGNOSIS — D509 Iron deficiency anemia, unspecified: Secondary | ICD-10-CM | POA: Diagnosis not present

## 2014-05-15 DIAGNOSIS — N186 End stage renal disease: Secondary | ICD-10-CM | POA: Diagnosis not present

## 2014-05-15 DIAGNOSIS — N039 Chronic nephritic syndrome with unspecified morphologic changes: Secondary | ICD-10-CM | POA: Diagnosis not present

## 2014-05-15 DIAGNOSIS — Z992 Dependence on renal dialysis: Secondary | ICD-10-CM | POA: Diagnosis not present

## 2014-05-15 DIAGNOSIS — N2581 Secondary hyperparathyroidism of renal origin: Secondary | ICD-10-CM | POA: Diagnosis not present

## 2014-05-15 DIAGNOSIS — D631 Anemia in chronic kidney disease: Secondary | ICD-10-CM | POA: Diagnosis not present

## 2014-06-13 DIAGNOSIS — N186 End stage renal disease: Secondary | ICD-10-CM | POA: Diagnosis not present

## 2014-06-15 DIAGNOSIS — Z992 Dependence on renal dialysis: Secondary | ICD-10-CM | POA: Diagnosis not present

## 2014-06-15 DIAGNOSIS — N039 Chronic nephritic syndrome with unspecified morphologic changes: Secondary | ICD-10-CM | POA: Diagnosis not present

## 2014-06-15 DIAGNOSIS — D509 Iron deficiency anemia, unspecified: Secondary | ICD-10-CM | POA: Diagnosis not present

## 2014-06-15 DIAGNOSIS — N186 End stage renal disease: Secondary | ICD-10-CM | POA: Diagnosis not present

## 2014-06-19 DIAGNOSIS — D509 Iron deficiency anemia, unspecified: Secondary | ICD-10-CM | POA: Diagnosis not present

## 2014-06-19 DIAGNOSIS — N039 Chronic nephritic syndrome with unspecified morphologic changes: Secondary | ICD-10-CM | POA: Diagnosis not present

## 2014-06-19 DIAGNOSIS — N186 End stage renal disease: Secondary | ICD-10-CM | POA: Diagnosis not present

## 2014-06-19 DIAGNOSIS — Z992 Dependence on renal dialysis: Secondary | ICD-10-CM | POA: Diagnosis not present

## 2014-06-21 DIAGNOSIS — D509 Iron deficiency anemia, unspecified: Secondary | ICD-10-CM | POA: Diagnosis not present

## 2014-06-21 DIAGNOSIS — N039 Chronic nephritic syndrome with unspecified morphologic changes: Secondary | ICD-10-CM | POA: Diagnosis not present

## 2014-06-21 DIAGNOSIS — N186 End stage renal disease: Secondary | ICD-10-CM | POA: Diagnosis not present

## 2014-06-21 DIAGNOSIS — Z992 Dependence on renal dialysis: Secondary | ICD-10-CM | POA: Diagnosis not present

## 2014-06-23 DIAGNOSIS — D509 Iron deficiency anemia, unspecified: Secondary | ICD-10-CM | POA: Diagnosis not present

## 2014-06-23 DIAGNOSIS — D631 Anemia in chronic kidney disease: Secondary | ICD-10-CM | POA: Diagnosis not present

## 2014-06-23 DIAGNOSIS — Z992 Dependence on renal dialysis: Secondary | ICD-10-CM | POA: Diagnosis not present

## 2014-06-23 DIAGNOSIS — N186 End stage renal disease: Secondary | ICD-10-CM | POA: Diagnosis not present

## 2014-06-26 DIAGNOSIS — Z992 Dependence on renal dialysis: Secondary | ICD-10-CM | POA: Diagnosis not present

## 2014-06-26 DIAGNOSIS — D509 Iron deficiency anemia, unspecified: Secondary | ICD-10-CM | POA: Diagnosis not present

## 2014-06-26 DIAGNOSIS — D631 Anemia in chronic kidney disease: Secondary | ICD-10-CM | POA: Diagnosis not present

## 2014-06-26 DIAGNOSIS — N186 End stage renal disease: Secondary | ICD-10-CM | POA: Diagnosis not present

## 2014-06-28 DIAGNOSIS — D631 Anemia in chronic kidney disease: Secondary | ICD-10-CM | POA: Diagnosis not present

## 2014-06-28 DIAGNOSIS — Z992 Dependence on renal dialysis: Secondary | ICD-10-CM | POA: Diagnosis not present

## 2014-06-28 DIAGNOSIS — N186 End stage renal disease: Secondary | ICD-10-CM | POA: Diagnosis not present

## 2014-06-28 DIAGNOSIS — D509 Iron deficiency anemia, unspecified: Secondary | ICD-10-CM | POA: Diagnosis not present

## 2014-07-01 DIAGNOSIS — N186 End stage renal disease: Secondary | ICD-10-CM | POA: Diagnosis not present

## 2014-07-01 DIAGNOSIS — D631 Anemia in chronic kidney disease: Secondary | ICD-10-CM | POA: Diagnosis not present

## 2014-07-01 DIAGNOSIS — D509 Iron deficiency anemia, unspecified: Secondary | ICD-10-CM | POA: Diagnosis not present

## 2014-07-01 DIAGNOSIS — Z992 Dependence on renal dialysis: Secondary | ICD-10-CM | POA: Diagnosis not present

## 2014-07-03 DIAGNOSIS — N186 End stage renal disease: Secondary | ICD-10-CM | POA: Diagnosis not present

## 2014-07-03 DIAGNOSIS — Z992 Dependence on renal dialysis: Secondary | ICD-10-CM | POA: Diagnosis not present

## 2014-07-03 DIAGNOSIS — D631 Anemia in chronic kidney disease: Secondary | ICD-10-CM | POA: Diagnosis not present

## 2014-07-03 DIAGNOSIS — D509 Iron deficiency anemia, unspecified: Secondary | ICD-10-CM | POA: Diagnosis not present

## 2014-07-05 DIAGNOSIS — D631 Anemia in chronic kidney disease: Secondary | ICD-10-CM | POA: Diagnosis not present

## 2014-07-05 DIAGNOSIS — Z992 Dependence on renal dialysis: Secondary | ICD-10-CM | POA: Diagnosis not present

## 2014-07-05 DIAGNOSIS — D509 Iron deficiency anemia, unspecified: Secondary | ICD-10-CM | POA: Diagnosis not present

## 2014-07-05 DIAGNOSIS — N186 End stage renal disease: Secondary | ICD-10-CM | POA: Diagnosis not present

## 2014-07-07 DIAGNOSIS — Z992 Dependence on renal dialysis: Secondary | ICD-10-CM | POA: Diagnosis not present

## 2014-07-07 DIAGNOSIS — D509 Iron deficiency anemia, unspecified: Secondary | ICD-10-CM | POA: Diagnosis not present

## 2014-07-07 DIAGNOSIS — N186 End stage renal disease: Secondary | ICD-10-CM | POA: Diagnosis not present

## 2014-07-07 DIAGNOSIS — D631 Anemia in chronic kidney disease: Secondary | ICD-10-CM | POA: Diagnosis not present

## 2014-07-10 DIAGNOSIS — D509 Iron deficiency anemia, unspecified: Secondary | ICD-10-CM | POA: Diagnosis not present

## 2014-07-10 DIAGNOSIS — D631 Anemia in chronic kidney disease: Secondary | ICD-10-CM | POA: Diagnosis not present

## 2014-07-10 DIAGNOSIS — Z992 Dependence on renal dialysis: Secondary | ICD-10-CM | POA: Diagnosis not present

## 2014-07-10 DIAGNOSIS — N186 End stage renal disease: Secondary | ICD-10-CM | POA: Diagnosis not present

## 2014-07-14 DIAGNOSIS — N186 End stage renal disease: Secondary | ICD-10-CM | POA: Diagnosis not present

## 2014-07-14 DIAGNOSIS — D509 Iron deficiency anemia, unspecified: Secondary | ICD-10-CM | POA: Diagnosis not present

## 2014-07-14 DIAGNOSIS — D631 Anemia in chronic kidney disease: Secondary | ICD-10-CM | POA: Diagnosis not present

## 2014-07-14 DIAGNOSIS — Z992 Dependence on renal dialysis: Secondary | ICD-10-CM | POA: Diagnosis not present

## 2014-07-16 ENCOUNTER — Emergency Department (HOSPITAL_COMMUNITY)
Admission: EM | Admit: 2014-07-16 | Discharge: 2014-07-16 | Disposition: A | Discharge status: Home / Self Care | Payer: Medicare Other | Attending: Emergency Medicine | Admitting: Emergency Medicine

## 2014-07-16 ENCOUNTER — Encounter (HOSPITAL_COMMUNITY): Payer: Self-pay | Admitting: Emergency Medicine

## 2014-07-16 ENCOUNTER — Emergency Department (HOSPITAL_COMMUNITY): Payer: Medicare Other

## 2014-07-16 DIAGNOSIS — N186 End stage renal disease: Secondary | ICD-10-CM | POA: Diagnosis not present

## 2014-07-16 DIAGNOSIS — K8020 Calculus of gallbladder without cholecystitis without obstruction: Secondary | ICD-10-CM | POA: Diagnosis not present

## 2014-07-16 DIAGNOSIS — I120 Hypertensive chronic kidney disease with stage 5 chronic kidney disease or end stage renal disease: Secondary | ICD-10-CM | POA: Diagnosis not present

## 2014-07-16 DIAGNOSIS — I10 Essential (primary) hypertension: Secondary | ICD-10-CM | POA: Diagnosis not present

## 2014-07-16 DIAGNOSIS — Z9119 Patient's noncompliance with other medical treatment and regimen: Secondary | ICD-10-CM | POA: Insufficient documentation

## 2014-07-16 DIAGNOSIS — Z79899 Other long term (current) drug therapy: Secondary | ICD-10-CM | POA: Insufficient documentation

## 2014-07-16 DIAGNOSIS — R1011 Right upper quadrant pain: Secondary | ICD-10-CM | POA: Insufficient documentation

## 2014-07-16 DIAGNOSIS — Z91199 Patient's noncompliance with other medical treatment and regimen due to unspecified reason: Secondary | ICD-10-CM | POA: Insufficient documentation

## 2014-07-16 DIAGNOSIS — F17200 Nicotine dependence, unspecified, uncomplicated: Secondary | ICD-10-CM | POA: Diagnosis not present

## 2014-07-16 DIAGNOSIS — I5020 Unspecified systolic (congestive) heart failure: Secondary | ICD-10-CM | POA: Diagnosis not present

## 2014-07-16 DIAGNOSIS — K819 Cholecystitis, unspecified: Secondary | ICD-10-CM | POA: Insufficient documentation

## 2014-07-16 DIAGNOSIS — K810 Acute cholecystitis: Secondary | ICD-10-CM | POA: Diagnosis not present

## 2014-07-16 LAB — COMPREHENSIVE METABOLIC PANEL
ALT: 85 U/L — AB (ref 0–53)
AST: 49 U/L — ABNORMAL HIGH (ref 0–37)
Albumin: 2.9 g/dL — ABNORMAL LOW (ref 3.5–5.2)
Alkaline Phosphatase: 58 U/L (ref 39–117)
Anion gap: 22 — ABNORMAL HIGH (ref 5–15)
BILIRUBIN TOTAL: 0.2 mg/dL — AB (ref 0.3–1.2)
BUN: 69 mg/dL — ABNORMAL HIGH (ref 6–23)
CALCIUM: 7.3 mg/dL — AB (ref 8.4–10.5)
CO2: 21 meq/L (ref 19–32)
Chloride: 92 mEq/L — ABNORMAL LOW (ref 96–112)
Creatinine, Ser: 12.4 mg/dL — ABNORMAL HIGH (ref 0.50–1.35)
GFR, EST AFRICAN AMERICAN: 5 mL/min — AB (ref >90)
GFR, EST NON AFRICAN AMERICAN: 4 mL/min — AB (ref >90)
GLUCOSE: 113 mg/dL — AB (ref 70–99)
Potassium: 4.5 mEq/L (ref 3.7–5.3)
SODIUM: 135 meq/L — AB (ref 137–147)
Total Protein: 6 g/dL (ref 6.0–8.3)

## 2014-07-16 LAB — CBC WITH DIFFERENTIAL/PLATELET
Basophils Absolute: 0 10*3/uL (ref 0.0–0.1)
Basophils Relative: 0 % (ref 0–1)
Eosinophils Absolute: 0.1 10*3/uL (ref 0.0–0.7)
Eosinophils Relative: 2 % (ref 0–5)
HEMATOCRIT: 33.4 % — AB (ref 39.0–52.0)
Hemoglobin: 11.2 g/dL — ABNORMAL LOW (ref 13.0–17.0)
LYMPHS ABS: 2 10*3/uL (ref 0.7–4.0)
LYMPHS PCT: 35 % (ref 12–46)
MCH: 30.9 pg (ref 26.0–34.0)
MCHC: 33.5 g/dL (ref 30.0–36.0)
MCV: 92.3 fL (ref 78.0–100.0)
MONO ABS: 0.6 10*3/uL (ref 0.1–1.0)
Monocytes Relative: 11 % (ref 3–12)
Neutro Abs: 3 10*3/uL (ref 1.7–7.7)
Neutrophils Relative %: 52 % (ref 43–77)
PLATELETS: 263 10*3/uL (ref 150–400)
RBC: 3.62 MIL/uL — AB (ref 4.22–5.81)
RDW: 15 % (ref 11.5–15.5)
WBC: 5.7 10*3/uL (ref 4.0–10.5)

## 2014-07-16 LAB — LIPASE, BLOOD: Lipase: 20 U/L (ref 11–59)

## 2014-07-16 MED ORDER — HYDROMORPHONE HCL PF 1 MG/ML IJ SOLN
0.5000 mg | Freq: Once | INTRAMUSCULAR | Status: AC
  Administered 2014-07-16: 0.5 mg via INTRAVENOUS
  Filled 2014-07-16: qty 1

## 2014-07-16 MED ORDER — ONDANSETRON 4 MG PO TBDP: ORAL_TABLET | ORAL | Status: DC

## 2014-07-16 MED ORDER — OXYCODONE-ACETAMINOPHEN 5-325 MG PO TABS: 1.0000 | ORAL_TABLET | Freq: Four times a day (QID) | ORAL | Status: DC | PRN

## 2014-07-16 MED ORDER — ONDANSETRON HCL 4 MG/2ML IJ SOLN
4.0000 mg | Freq: Once | INTRAMUSCULAR | Status: AC
  Administered 2014-07-16: 4 mg via INTRAVENOUS
  Filled 2014-07-16: qty 2

## 2014-07-16 NOTE — ED Notes (Signed)
MD at bedside. 

## 2014-07-16 NOTE — ED Provider Notes (Signed)
CSN: [id number]     Arrival date & time 07/16/14  [id number] History   First MD Initiated Contact with Patient 07/16/14 0740     Chief Complaint  Patient presents with  . Abdominal Pain     (Consider location/radiation/quality/duration/timing/severity/associated sxs/prior Treatment) HPI Comments: Pt presents with RUQ pain which has been worsening over last 2 weeks. He had a recent admission in May for cholecystitis. It was felt at that time that his gallbladder did not need to come out emergently. It was going to be scheduled to be removed as an outpatient. He states he was not aware of this and has not followed up with Gen. surgery. He states he was doing okay until about 2 weeks ago when it started hurting worse again. He's had some nausea but no vomiting. He denies he fevers or chills. He is a dialysis patient and had his last dialysis on Friday. He's had some generalized fatigue. He's been taking Bayer aspirin for the pain but says it hurts his stomach so he stopped taking it.   Past Medical History  Diagnosis Date  . Hypertension   . CKD (chronic kidney disease) stage 4, GFR 15-29 ml/min   . Systolic CHF     Ef [id number]  . Medical non-compliance   . Polysubstance abuse   . ESRD (end stage renal disease)    Past Surgical History  Procedure Laterality Date  . Insertion of dialysis catheter  07/17/2012    Procedure: INSERTION OF DIALYSIS CATHETER;  Surgeon: Conrad Ramah, MD;  Location: [place];  Service: Vascular;  Laterality: Right;  right internal jugular  . Bascilic vein transposition  01/19/2013    Procedure: BASCILIC VEIN TRANSPOSITION;  Surgeon: Conrad [place], MD;  Location: [place];  Service: Vascular;  Laterality: Left;  left 2nd stage basilic vein transposition   Family History  Problem Relation Age of Onset  . Hypertension Mother   . Diabetes Mother   . Hypertension Father    History  Substance Use Topics  . Smoking status: Current Every Day Smoker -- 0.25 packs/day    Types:  Cigarettes    Last Attempt to Quit: 08/09/2012  . Smokeless tobacco: Not on file     Comment: Trying to quit; 3 cigs/last 2 weeks  . Alcohol Use: 4.2 oz/week    7 Cans of beer per week     Comment: Patient not forthcoming.  2-3 beers per week    Review of Systems  Constitutional: Positive for fatigue. Negative for fever, chills and diaphoresis.  HENT: Negative for congestion, rhinorrhea and sneezing.   Eyes: Negative.   Respiratory: Negative for cough, chest tightness and shortness of breath.   Cardiovascular: Negative for chest pain and leg swelling.  Gastrointestinal: Positive for nausea and abdominal pain. Negative for vomiting, diarrhea and blood in stool.  Genitourinary: Negative for frequency, hematuria, flank pain and difficulty urinating.  Musculoskeletal: Negative for arthralgias and back pain.  Skin: Negative for rash.  Neurological: Negative for dizziness, speech difficulty, weakness, numbness and headaches.      Allergies  Review of patient's allergies indicates no known allergies.  Home Medications   Prior to Admission medications   Medication Sig Start Date End Date Taking? Authorizing Provider  calcium acetate (PHOSLO) 667 MG capsule Take 3 capsules (2,001 mg total) by mouth 3 (three) times daily with meals. 04/21/14  Yes Maryann Mikhail, DO  carvedilol (COREG) 12.5 MG tablet Take 2 tablets (25 mg total) by mouth 2 (two) times daily  with a meal. 03/09/14 10/25/15 Yes Oswald Hillock, MD  isosorbide mononitrate (IMDUR) 60 MG 24 hr tablet Take 60 mg by mouth daily. 07/21/12  Yes Orson Eva, MD  Multiple Vitamin (MULTIVITAMIN WITH MINERALS) TABS tablet Take 1 tablet by mouth daily as needed (Nutritional supplementation).    Yes Historical Provider, MD  ondansetron (ZOFRAN ODT) 4 MG disintegrating tablet 4mg  ODT q4 hours prn nausea/vomit 07/16/14   Malvin Johns, MD  oxyCODONE-acetaminophen (PERCOCET) 5-325 MG per tablet Take 1-2 tablets by mouth every 6 (six) hours as needed for  moderate pain. 07/16/14   Malvin Johns, MD   BP 147/109  Pulse 62  Temp(Src) 97.9 F (36.6 C) (Oral)  Resp 16  Ht 5\' 9"  (1.753 m)  Wt 170 lb (77.111 kg)  BMI 25.09 kg/m2  SpO2 100% Physical Exam  Constitutional: He is oriented to person, place, and time. He appears well-developed and well-nourished.  HENT:  Head: Normocephalic and atraumatic.  Eyes: Pupils are equal, round, and reactive to light.  Neck: Normal range of motion. Neck supple.  Cardiovascular: Normal rate, regular rhythm and normal heart sounds.   Pulmonary/Chest: Effort normal and breath sounds normal. No respiratory distress. He has no wheezes. He has no rales. He exhibits no tenderness.  Abdominal: Soft. Bowel sounds are normal. There is tenderness (moderate tenderness to RUQ). There is no rebound and no guarding.  Musculoskeletal: Normal range of motion. He exhibits no edema.  Lymphadenopathy:    He has no cervical adenopathy.  Neurological: He is alert and oriented to person, place, and time.  Skin: Skin is warm and dry. No rash noted.  Psychiatric: He has a normal mood and affect.    ED Course  Procedures (including critical care time) Labs Review Labs Reviewed  CBC WITH DIFFERENTIAL - Abnormal; Notable for the following:    RBC 3.62 (*)    Hemoglobin 11.2 (*)    HCT 33.4 (*)    All other components within normal limits  COMPREHENSIVE METABOLIC PANEL - Abnormal; Notable for the following:    Sodium 135 (*)    Chloride 92 (*)    Glucose, Bld 113 (*)    BUN 69 (*)    Creatinine, Ser 12.40 (*)    Calcium 7.3 (*)    Albumin 2.9 (*)    AST 49 (*)    ALT 85 (*)    Total Bilirubin 0.2 (*)    GFR calc non Af Amer 4 (*)    GFR calc Af Amer 5 (*)    Anion gap 22 (*)    All other components within normal limits  LIPASE, BLOOD  URINE RAPID DRUG SCREEN (HOSP PERFORMED)    Imaging Review US Abdomen Complete  07/16/2014   CLINICAL DATA:  Right upper quadrant pain  EXAM: ULTRASOUND ABDOMEN COMPLETE   COMPARISON:  Abdominal MRI 04/18/2014; prior abdominal ultrasound 04/18/2014  FINDINGS: Gallbladder:  Solitary small 7 mm mobile echogenic focus consistent with cholelithiasis. The gallbladder wall is diffusely thickened at 5.3 mm. Gallbladder wall thickening was also present on prior MRI from 04/18/2014. Per the sonographer, the sonographic Percell Miller sign was negative.  Common bile duct:  Diameter: Within normal limits at 4.5 mm  Liver:  No focal lesion identified. Within normal limits in parenchymal echogenicity. Main portal vein is patent with normal hepatopetal flow.  IVC:  No abnormality visualized. Additionally, sub cm upper pole cystic structures are present.  Pancreas:  Visualized portion unremarkable.  Spleen:  Size and appearance within normal limits.  Right Kidney:  Length: 8 cm. Echogenic renal parenchyma. Subcentimeter simple cysts. Additionally, there is a 1.6 cm region of the right kidney which appears more solid in nature. However, correlation with the recent prior MRI demonstrates no solid lesion in the right kidney. This may represent a hemorrhagic cyst.  Left Kidney:  Length: 7.7 cm. Diffusely echogenic renal parenchyma. Solid 1.7 x 1.9 x 1.9 cm lesion in the upper pole consistent with neoplasm as seen on prior MRI. Complex 1.5 x 1.4 x 1.5 cm lesion in the lower pole correlates with the hemorrhagic cyst versus hemorrhagic neoplasm seen on prior imaging.  Abdominal aorta:  No aneurysm visualized.  Other findings:  Mild perihepatic and perisplenic ascites.  IMPRESSION: 1. Cholelithiasis with gallbladder wall thickening but negative sonographic Murphy sign. Gallbladder wall thickening was also noted on MRI of the abdomen from 04/18/2014. Overall, findings are most suggestive of chronic cholecystitis. If there is clinical concern for acute cholecystitis consider nuclear medicine HIDA scan for confirmation. 2. Multiple renal lesions bilaterally as seen on the recent prior MRI. There are 2 lesions which  appear solid in the left kidney and correlate with suspected renal neoplasm and hemorrhagic cyst versus hemorrhagic neoplasm as described on prior imaging. A potentially a solid lesion in the right kidney has no correlate on the prior MRI in may be artifactual or represent interval hemorrhage into a cyst. 3. Small volume peri hepatic and perisplenic ascites.   Electronically Signed   By: Jacqulynn Cadet M.D.   On: 07/16/2014 09:50     EKG Interpretation None      MDM   Final diagnoses:  Cholecystitis    Patient with gallstones and gallbladder wall thickening which appears to be ongoing since his last evaluation. He does have some mild elevation of his LFTs. Lipase is normal. He has no elevated white count or fever. I discussed this with surgery on call, Dr. Grandville Silos who recommends discharge and outpatient followup to schedule elective removal of his gallbladder. I discussed this with the patient. I advised him that he needs to stop doing cocaine which he says he did about a week ago. I advised him to call tomorrow to get an appointment to follow with surgery return here if he has worsening pain vomiting or fevers.    Malvin Johns, MD 07/16/14 1118

## 2014-07-16 NOTE — ED Notes (Signed)
Pt's O2 sat dropped to 88%, pt resting in bed upon entering room. Pt sts he feels slightly sob. Reports hx of sleep apnea. Pt placed on 1 liter/min O2 nasal canula. O2 sats raised with O2 placement.

## 2014-07-16 NOTE — Discharge Instructions (Signed)
Cholecystitis Cholecystitis is an inflammation of your gallbladder. It is usually caused by a buildup of gallstones or sludge (cholelithiasis) in your gallbladder. The gallbladder stores a fluid that helps digest fats (bile). Cholecystitis is serious and needs treatment right away.  CAUSES   Gallstones. Gallstones can block the tube that leads to your gallbladder, causing bile to build up. As bile builds up, the gallbladder becomes inflamed.  Bile duct problems, such as blockage from scarring or kinking.  Tumors. Tumors can stop bile from leaving your gallbladder correctly, causing bile to build up. As bile builds up, the gallbladder becomes inflamed. SYMPTOMS   Nausea.  Vomiting.  Abdominal pain, especially in the upper right area of your abdomen.  Abdominal tenderness or bloating.  Sweating.  Chills.  Fever.  Yellowing of the skin and the whites of the eyes (jaundice). DIAGNOSIS  Your caregiver may order blood tests to look for infection or gallbladder problems. Your caregiver may also order imaging tests, such as an ultrasound or computed tomography (CT) scan. Further tests may include a hepatobiliary iminodiacetic acid (HIDA) scan. This scan allows your caregiver to see your bile move from the liver to the gallbladder and to the small intestine. TREATMENT  A hospital stay is usually necessary to lessen the inflammation of your gallbladder. You may be required to not eat or drink (fast) for a certain amount of time. You may be given medicine to treat pain or an antibiotic medicine to treat an infection. Surgery may be needed to remove your gallbladder (cholecystectomy) once the inflammation has gone down. Surgery may be needed right away if you develop complications such as death of gallbladder tissue (gangrene) or a tear (perforation) of the gallbladder.  [facility] care will depend on your treatment. In general:  If you were given antibiotics, take them as  directed. Finish them even if you start to feel better.  Only take over-the-counter or prescription medicines for pain, discomfort, or fever as directed by your caregiver.  Follow a low-fat diet until you see your caregiver again.  Keep all follow-up visits as directed by your caregiver. SEEK IMMEDIATE MEDICAL CARE IF:   Your pain is increasing and not controlled by medicines.  Your pain moves to another part of your abdomen or to your back.  You have a fever.  You have nausea and vomiting. MAKE SURE YOU:  Understand these instructions.  Will watch your condition.  Will get help right away if you are not doing well or get worse. Document Released: 12/01/2005 Document Revised: 02/23/2012 Document Reviewed: 10/17/2011 [facility] Patient Information 2015 [place], [place]. This information is not intended to replace advice given to you by your health care provider. Make sure you discuss any questions you have with your health care provider.

## 2014-07-16 NOTE — ED Notes (Signed)
Pt states that he has had RUQ pain for approx 2 weeks. Denies N/V. States that he feels tired and weak when he walks as well. Pt last dialysis treatment was on Friday.

## 2014-07-17 DIAGNOSIS — N2581 Secondary hyperparathyroidism of renal origin: Secondary | ICD-10-CM | POA: Diagnosis not present

## 2014-07-17 DIAGNOSIS — Z992 Dependence on renal dialysis: Secondary | ICD-10-CM | POA: Diagnosis not present

## 2014-07-17 DIAGNOSIS — D509 Iron deficiency anemia, unspecified: Secondary | ICD-10-CM | POA: Diagnosis not present

## 2014-07-17 DIAGNOSIS — N039 Chronic nephritic syndrome with unspecified morphologic changes: Secondary | ICD-10-CM | POA: Diagnosis not present

## 2014-07-17 DIAGNOSIS — D631 Anemia in chronic kidney disease: Secondary | ICD-10-CM | POA: Diagnosis not present

## 2014-07-17 DIAGNOSIS — N186 End stage renal disease: Secondary | ICD-10-CM | POA: Diagnosis not present

## 2014-07-19 DIAGNOSIS — N039 Chronic nephritic syndrome with unspecified morphologic changes: Secondary | ICD-10-CM | POA: Diagnosis not present

## 2014-07-19 DIAGNOSIS — N2581 Secondary hyperparathyroidism of renal origin: Secondary | ICD-10-CM | POA: Diagnosis not present

## 2014-07-19 DIAGNOSIS — N186 End stage renal disease: Secondary | ICD-10-CM | POA: Diagnosis not present

## 2014-07-19 DIAGNOSIS — D509 Iron deficiency anemia, unspecified: Secondary | ICD-10-CM | POA: Diagnosis not present

## 2014-07-19 DIAGNOSIS — Z992 Dependence on renal dialysis: Secondary | ICD-10-CM | POA: Diagnosis not present

## 2014-07-21 DIAGNOSIS — D631 Anemia in chronic kidney disease: Secondary | ICD-10-CM | POA: Diagnosis not present

## 2014-07-21 DIAGNOSIS — Z992 Dependence on renal dialysis: Secondary | ICD-10-CM | POA: Diagnosis not present

## 2014-07-21 DIAGNOSIS — N186 End stage renal disease: Secondary | ICD-10-CM | POA: Diagnosis not present

## 2014-07-21 DIAGNOSIS — D509 Iron deficiency anemia, unspecified: Secondary | ICD-10-CM | POA: Diagnosis not present

## 2014-07-21 DIAGNOSIS — N2581 Secondary hyperparathyroidism of renal origin: Secondary | ICD-10-CM | POA: Diagnosis not present

## 2014-07-24 DIAGNOSIS — N2581 Secondary hyperparathyroidism of renal origin: Secondary | ICD-10-CM | POA: Diagnosis not present

## 2014-07-24 DIAGNOSIS — N186 End stage renal disease: Secondary | ICD-10-CM | POA: Diagnosis not present

## 2014-07-24 DIAGNOSIS — N039 Chronic nephritic syndrome with unspecified morphologic changes: Secondary | ICD-10-CM | POA: Diagnosis not present

## 2014-07-24 DIAGNOSIS — D509 Iron deficiency anemia, unspecified: Secondary | ICD-10-CM | POA: Diagnosis not present

## 2014-07-24 DIAGNOSIS — Z992 Dependence on renal dialysis: Secondary | ICD-10-CM | POA: Diagnosis not present

## 2014-07-26 DIAGNOSIS — N2581 Secondary hyperparathyroidism of renal origin: Secondary | ICD-10-CM | POA: Diagnosis not present

## 2014-07-26 DIAGNOSIS — N186 End stage renal disease: Secondary | ICD-10-CM | POA: Diagnosis not present

## 2014-07-26 DIAGNOSIS — D509 Iron deficiency anemia, unspecified: Secondary | ICD-10-CM | POA: Diagnosis not present

## 2014-07-26 DIAGNOSIS — Z992 Dependence on renal dialysis: Secondary | ICD-10-CM | POA: Diagnosis not present

## 2014-07-26 DIAGNOSIS — N039 Chronic nephritic syndrome with unspecified morphologic changes: Secondary | ICD-10-CM | POA: Diagnosis not present

## 2014-07-28 DIAGNOSIS — N2581 Secondary hyperparathyroidism of renal origin: Secondary | ICD-10-CM | POA: Diagnosis not present

## 2014-07-28 DIAGNOSIS — N186 End stage renal disease: Secondary | ICD-10-CM | POA: Diagnosis not present

## 2014-07-28 DIAGNOSIS — Z992 Dependence on renal dialysis: Secondary | ICD-10-CM | POA: Diagnosis not present

## 2014-07-28 DIAGNOSIS — D631 Anemia in chronic kidney disease: Secondary | ICD-10-CM | POA: Diagnosis not present

## 2014-07-28 DIAGNOSIS — D509 Iron deficiency anemia, unspecified: Secondary | ICD-10-CM | POA: Diagnosis not present

## 2014-07-31 DIAGNOSIS — Z992 Dependence on renal dialysis: Secondary | ICD-10-CM | POA: Diagnosis not present

## 2014-07-31 DIAGNOSIS — D631 Anemia in chronic kidney disease: Secondary | ICD-10-CM | POA: Diagnosis not present

## 2014-07-31 DIAGNOSIS — N2581 Secondary hyperparathyroidism of renal origin: Secondary | ICD-10-CM | POA: Diagnosis not present

## 2014-07-31 DIAGNOSIS — D509 Iron deficiency anemia, unspecified: Secondary | ICD-10-CM | POA: Diagnosis not present

## 2014-07-31 DIAGNOSIS — N186 End stage renal disease: Secondary | ICD-10-CM | POA: Diagnosis not present

## 2014-08-04 DIAGNOSIS — N186 End stage renal disease: Secondary | ICD-10-CM | POA: Diagnosis not present

## 2014-08-04 DIAGNOSIS — D631 Anemia in chronic kidney disease: Secondary | ICD-10-CM | POA: Diagnosis not present

## 2014-08-04 DIAGNOSIS — D509 Iron deficiency anemia, unspecified: Secondary | ICD-10-CM | POA: Diagnosis not present

## 2014-08-04 DIAGNOSIS — Z992 Dependence on renal dialysis: Secondary | ICD-10-CM | POA: Diagnosis not present

## 2014-08-04 DIAGNOSIS — N2581 Secondary hyperparathyroidism of renal origin: Secondary | ICD-10-CM | POA: Diagnosis not present

## 2014-08-07 DIAGNOSIS — Z992 Dependence on renal dialysis: Secondary | ICD-10-CM | POA: Diagnosis not present

## 2014-08-07 DIAGNOSIS — D631 Anemia in chronic kidney disease: Secondary | ICD-10-CM | POA: Diagnosis not present

## 2014-08-07 DIAGNOSIS — D509 Iron deficiency anemia, unspecified: Secondary | ICD-10-CM | POA: Diagnosis not present

## 2014-08-07 DIAGNOSIS — N2581 Secondary hyperparathyroidism of renal origin: Secondary | ICD-10-CM | POA: Diagnosis not present

## 2014-08-07 DIAGNOSIS — N186 End stage renal disease: Secondary | ICD-10-CM | POA: Diagnosis not present

## 2014-08-09 DIAGNOSIS — N2581 Secondary hyperparathyroidism of renal origin: Secondary | ICD-10-CM | POA: Diagnosis not present

## 2014-08-09 DIAGNOSIS — D631 Anemia in chronic kidney disease: Secondary | ICD-10-CM | POA: Diagnosis not present

## 2014-08-09 DIAGNOSIS — D509 Iron deficiency anemia, unspecified: Secondary | ICD-10-CM | POA: Diagnosis not present

## 2014-08-09 DIAGNOSIS — Z992 Dependence on renal dialysis: Secondary | ICD-10-CM | POA: Diagnosis not present

## 2014-08-09 DIAGNOSIS — N186 End stage renal disease: Secondary | ICD-10-CM | POA: Diagnosis not present

## 2014-08-11 DIAGNOSIS — Z992 Dependence on renal dialysis: Secondary | ICD-10-CM | POA: Diagnosis not present

## 2014-08-11 DIAGNOSIS — D509 Iron deficiency anemia, unspecified: Secondary | ICD-10-CM | POA: Diagnosis not present

## 2014-08-11 DIAGNOSIS — N2581 Secondary hyperparathyroidism of renal origin: Secondary | ICD-10-CM | POA: Diagnosis not present

## 2014-08-11 DIAGNOSIS — N186 End stage renal disease: Secondary | ICD-10-CM | POA: Diagnosis not present

## 2014-08-11 DIAGNOSIS — D631 Anemia in chronic kidney disease: Secondary | ICD-10-CM | POA: Diagnosis not present

## 2014-08-14 DIAGNOSIS — D509 Iron deficiency anemia, unspecified: Secondary | ICD-10-CM | POA: Diagnosis not present

## 2014-08-14 DIAGNOSIS — D631 Anemia in chronic kidney disease: Secondary | ICD-10-CM | POA: Diagnosis not present

## 2014-08-14 DIAGNOSIS — N186 End stage renal disease: Secondary | ICD-10-CM | POA: Diagnosis not present

## 2014-08-14 DIAGNOSIS — N2581 Secondary hyperparathyroidism of renal origin: Secondary | ICD-10-CM | POA: Diagnosis not present

## 2014-08-14 DIAGNOSIS — Z992 Dependence on renal dialysis: Secondary | ICD-10-CM | POA: Diagnosis not present

## 2014-08-16 DIAGNOSIS — N2581 Secondary hyperparathyroidism of renal origin: Secondary | ICD-10-CM | POA: Diagnosis not present

## 2014-08-16 DIAGNOSIS — N186 End stage renal disease: Secondary | ICD-10-CM | POA: Diagnosis not present

## 2014-08-16 DIAGNOSIS — D509 Iron deficiency anemia, unspecified: Secondary | ICD-10-CM | POA: Diagnosis not present

## 2014-08-16 DIAGNOSIS — Z992 Dependence on renal dialysis: Secondary | ICD-10-CM | POA: Diagnosis not present

## 2014-08-16 DIAGNOSIS — Z4931 Encounter for adequacy testing for hemodialysis: Secondary | ICD-10-CM | POA: Diagnosis not present

## 2014-09-13 DIAGNOSIS — N186 End stage renal disease: Secondary | ICD-10-CM | POA: Diagnosis not present

## 2014-09-15 DIAGNOSIS — Z23 Encounter for immunization: Secondary | ICD-10-CM | POA: Diagnosis not present

## 2014-09-15 DIAGNOSIS — D509 Iron deficiency anemia, unspecified: Secondary | ICD-10-CM | POA: Diagnosis not present

## 2014-09-15 DIAGNOSIS — N2581 Secondary hyperparathyroidism of renal origin: Secondary | ICD-10-CM | POA: Diagnosis not present

## 2014-09-15 DIAGNOSIS — N186 End stage renal disease: Secondary | ICD-10-CM | POA: Diagnosis not present

## 2014-09-15 DIAGNOSIS — Z4931 Encounter for adequacy testing for hemodialysis: Secondary | ICD-10-CM | POA: Diagnosis not present

## 2014-09-18 DIAGNOSIS — Z4931 Encounter for adequacy testing for hemodialysis: Secondary | ICD-10-CM | POA: Diagnosis not present

## 2014-09-18 DIAGNOSIS — N2581 Secondary hyperparathyroidism of renal origin: Secondary | ICD-10-CM | POA: Diagnosis not present

## 2014-09-18 DIAGNOSIS — D509 Iron deficiency anemia, unspecified: Secondary | ICD-10-CM | POA: Diagnosis not present

## 2014-09-18 DIAGNOSIS — Z23 Encounter for immunization: Secondary | ICD-10-CM | POA: Diagnosis not present

## 2014-09-18 DIAGNOSIS — N186 End stage renal disease: Secondary | ICD-10-CM | POA: Diagnosis not present

## 2014-09-22 DIAGNOSIS — Z4931 Encounter for adequacy testing for hemodialysis: Secondary | ICD-10-CM | POA: Diagnosis not present

## 2014-09-22 DIAGNOSIS — N186 End stage renal disease: Secondary | ICD-10-CM | POA: Diagnosis not present

## 2014-09-22 DIAGNOSIS — D509 Iron deficiency anemia, unspecified: Secondary | ICD-10-CM | POA: Diagnosis not present

## 2014-09-22 DIAGNOSIS — N2581 Secondary hyperparathyroidism of renal origin: Secondary | ICD-10-CM | POA: Diagnosis not present

## 2014-09-22 DIAGNOSIS — Z23 Encounter for immunization: Secondary | ICD-10-CM | POA: Diagnosis not present

## 2014-09-23 ENCOUNTER — Emergency Department (HOSPITAL_COMMUNITY)
Admission: EM | Admit: 2014-09-23 | Discharge: 2014-09-23 | Disposition: A | Discharge status: Home / Self Care | Payer: Medicare Other | Attending: Emergency Medicine | Admitting: Emergency Medicine

## 2014-09-23 ENCOUNTER — Encounter (HOSPITAL_COMMUNITY): Payer: Self-pay | Admitting: Emergency Medicine

## 2014-09-23 ENCOUNTER — Emergency Department (HOSPITAL_COMMUNITY): Payer: Medicare Other

## 2014-09-23 DIAGNOSIS — Z992 Dependence on renal dialysis: Secondary | ICD-10-CM | POA: Insufficient documentation

## 2014-09-23 DIAGNOSIS — R1011 Right upper quadrant pain: Secondary | ICD-10-CM | POA: Diagnosis not present

## 2014-09-23 DIAGNOSIS — Z720 Tobacco use: Secondary | ICD-10-CM | POA: Insufficient documentation

## 2014-09-23 DIAGNOSIS — Z79899 Other long term (current) drug therapy: Secondary | ICD-10-CM | POA: Insufficient documentation

## 2014-09-23 DIAGNOSIS — I120 Hypertensive chronic kidney disease with stage 5 chronic kidney disease or end stage renal disease: Secondary | ICD-10-CM | POA: Insufficient documentation

## 2014-09-23 DIAGNOSIS — I10 Essential (primary) hypertension: Secondary | ICD-10-CM | POA: Diagnosis not present

## 2014-09-23 DIAGNOSIS — R05 Cough: Secondary | ICD-10-CM | POA: Diagnosis present

## 2014-09-23 DIAGNOSIS — G8929 Other chronic pain: Secondary | ICD-10-CM

## 2014-09-23 DIAGNOSIS — N186 End stage renal disease: Secondary | ICD-10-CM | POA: Diagnosis not present

## 2014-09-23 DIAGNOSIS — I5020 Unspecified systolic (congestive) heart failure: Secondary | ICD-10-CM | POA: Insufficient documentation

## 2014-09-23 DIAGNOSIS — J441 Chronic obstructive pulmonary disease with (acute) exacerbation: Secondary | ICD-10-CM | POA: Insufficient documentation

## 2014-09-23 DIAGNOSIS — R109 Unspecified abdominal pain: Secondary | ICD-10-CM

## 2014-09-23 DIAGNOSIS — J9811 Atelectasis: Secondary | ICD-10-CM | POA: Diagnosis not present

## 2014-09-23 LAB — COMPREHENSIVE METABOLIC PANEL
ALBUMIN: 3.7 g/dL (ref 3.5–5.2)
ALT: 24 U/L (ref 0–53)
AST: 22 U/L (ref 0–37)
Alkaline Phosphatase: 56 U/L (ref 39–117)
Anion gap: 18 — ABNORMAL HIGH (ref 5–15)
BUN: 50 mg/dL — ABNORMAL HIGH (ref 6–23)
CO2: 29 mEq/L (ref 19–32)
CREATININE: 10.5 mg/dL — AB (ref 0.50–1.35)
Calcium: 8.5 mg/dL (ref 8.4–10.5)
Chloride: 93 mEq/L — ABNORMAL LOW (ref 96–112)
GFR calc Af Amer: 6 mL/min — ABNORMAL LOW (ref >90)
GFR, EST NON AFRICAN AMERICAN: 5 mL/min — AB (ref >90)
Glucose, Bld: 102 mg/dL — ABNORMAL HIGH (ref 70–99)
Potassium: 4.9 mEq/L (ref 3.7–5.3)
SODIUM: 140 meq/L (ref 137–147)
TOTAL PROTEIN: 8 g/dL (ref 6.0–8.3)
Total Bilirubin: 0.5 mg/dL (ref 0.3–1.2)

## 2014-09-23 LAB — CBC WITH DIFFERENTIAL/PLATELET
BASOS ABS: 0 10*3/uL (ref 0.0–0.1)
Basophils Relative: 1 % (ref 0–1)
EOS ABS: 0.3 10*3/uL (ref 0.0–0.7)
Eosinophils Relative: 9 % — ABNORMAL HIGH (ref 0–5)
HCT: 38.4 % — ABNORMAL LOW (ref 39.0–52.0)
Hemoglobin: 12.6 g/dL — ABNORMAL LOW (ref 13.0–17.0)
Lymphocytes Relative: 16 % (ref 12–46)
Lymphs Abs: 0.6 10*3/uL — ABNORMAL LOW (ref 0.7–4.0)
MCH: 30 pg (ref 26.0–34.0)
MCHC: 32.8 g/dL (ref 30.0–36.0)
MCV: 91.4 fL (ref 78.0–100.0)
Monocytes Absolute: 0.5 10*3/uL (ref 0.1–1.0)
Monocytes Relative: 14 % — ABNORMAL HIGH (ref 3–12)
Neutro Abs: 2.2 10*3/uL (ref 1.7–7.7)
Neutrophils Relative %: 60 % (ref 43–77)
PLATELETS: 212 10*3/uL (ref 150–400)
RBC: 4.2 MIL/uL — ABNORMAL LOW (ref 4.22–5.81)
RDW: 15.6 % — AB (ref 11.5–15.5)
WBC: 3.7 10*3/uL — AB (ref 4.0–10.5)

## 2014-09-23 LAB — I-STAT TROPONIN, ED: Troponin i, poc: 0.26 ng/mL (ref 0.00–0.08)

## 2014-09-23 LAB — LIPASE, BLOOD: LIPASE: 17 U/L (ref 11–59)

## 2014-09-23 LAB — TROPONIN I: Troponin I: <0.3 ng/mL (ref <0.30)

## 2014-09-23 MED ORDER — MORPHINE SULFATE 4 MG/ML IJ SOLN
4.0000 mg | Freq: Once | INTRAMUSCULAR | Status: AC
  Administered 2014-09-23: 4 mg via INTRAVENOUS
  Filled 2014-09-23: qty 1

## 2014-09-23 MED ORDER — ALBUTEROL SULFATE (2.5 MG/3ML) 0.083% IN NEBU
5.0000 mg | INHALATION_SOLUTION | Freq: Once | RESPIRATORY_TRACT | Status: AC
  Administered 2014-09-23: 5 mg via RESPIRATORY_TRACT
  Filled 2014-09-23: qty 6

## 2014-09-23 MED ORDER — PREDNISONE 20 MG PO TABS: 60.0000 mg | ORAL_TABLET | Freq: Every day | ORAL | Status: DC

## 2014-09-23 MED ORDER — ALBUTEROL SULFATE HFA 108 (90 BASE) MCG/ACT IN AERS: 1.0000 | INHALATION_SPRAY | Freq: Four times a day (QID) | RESPIRATORY_TRACT | Status: DC | PRN

## 2014-09-23 MED ORDER — IPRATROPIUM BROMIDE 0.02 % IN SOLN
0.5000 mg | Freq: Once | RESPIRATORY_TRACT | Status: AC
  Administered 2014-09-23: 0.5 mg via RESPIRATORY_TRACT
  Filled 2014-09-23: qty 2.5

## 2014-09-23 MED ORDER — ONDANSETRON HCL 4 MG/2ML IJ SOLN
4.0000 mg | Freq: Once | INTRAMUSCULAR | Status: AC
  Administered 2014-09-23: 4 mg via INTRAVENOUS
  Filled 2014-09-23: qty 2

## 2014-09-23 MED ORDER — ASPIRIN 81 MG PO CHEW
324.0000 mg | CHEWABLE_TABLET | Freq: Once | ORAL | Status: AC
  Administered 2014-09-23: 324 mg via ORAL
  Filled 2014-09-23: qty 4

## 2014-09-23 MED ORDER — PREDNISONE 20 MG PO TABS
60.0000 mg | ORAL_TABLET | Freq: Once | ORAL | Status: AC
  Administered 2014-09-23: 60 mg via ORAL
  Filled 2014-09-23: qty 3

## 2014-09-23 NOTE — ED Notes (Signed)
Pt. reports productive cough for several days , denies fever or chills , hemodialysis q Mon /Wed/Fri .

## 2014-09-23 NOTE — ED Provider Notes (Signed)
CSN: [id number]     Arrival date & time 09/23/14  0554 History   First MD Initiated Contact with Patient 09/23/14 [phone]     Chief Complaint  Patient presents with  . Cough     (Consider location/radiation/quality/duration/timing/severity/associated sxs/prior Treatment) HPI Comments: Patient with a history of ESRD and CHF who currently gets hemodialysis presents today with a chief complaint of cough that has been present for the past 2 weeks.  Cough gradually worsening.  He reports that he is taking Dayquil, but does not feel that it is helping.  He reports associated SOB.  He denies fever or chills.  He is also complaining of some pain in the right lower rib area and RUQ abdominal area.  He reports that this pain has been present for the past 6 months.  Pain is no different today.  Pain does not become worse after eating, but does become worse with walking.  He states that he has had two previous abdominal ultrasounds to evaluate his Gallbladder.  Review of the chart shows that the patient has been diagnosed with Chronic Cholecystitis in the past and has been referred to surgery.  He reports that he has not followed up with Surgery.  He has not taken anything for the pain.  He does complain of associated nausea, but no vomiting.  The history is provided by the patient.    Past Medical History  Diagnosis Date  . Hypertension   . CKD (chronic kidney disease) stage 4, GFR 15-29 ml/min   . Systolic CHF     Ef [id number]  . Medical non-compliance   . Polysubstance abuse   . ESRD (end stage renal disease)    Past Surgical History  Procedure Laterality Date  . Insertion of dialysis catheter  07/17/2012    Procedure: INSERTION OF DIALYSIS CATHETER;  Surgeon: Conrad [place], MD;  Location: [place];  Service: Vascular;  Laterality: Right;  right internal jugular  . Bascilic vein transposition  01/19/2013    Procedure: BASCILIC VEIN TRANSPOSITION;  Surgeon: Conrad [place], MD;  Location: [place];  Service:  Vascular;  Laterality: Left;  left 2nd stage basilic vein transposition   Family History  Problem Relation Age of Onset  . Hypertension Mother   . Diabetes Mother   . Hypertension Father    History  Substance Use Topics  . Smoking status: Current Every Day Smoker -- 0.25 packs/day    Types: Cigarettes    Last Attempt to Quit: 08/09/2012  . Smokeless tobacco: Not on file     Comment: Trying to quit; 3 cigs/last 2 weeks  . Alcohol Use: 4.2 oz/week    7 Cans of beer per week     Comment: Patient not forthcoming.  2-3 beers per week    Review of Systems  All other systems reviewed and are negative.     Allergies  Review of patient's allergies indicates no known allergies.  Home Medications   Prior to Admission medications   Medication Sig Start Date End Date Taking? Authorizing Provider  calcium acetate (PHOSLO) 667 MG capsule Take 3 capsules (2,001 mg total) by mouth 3 (three) times daily with meals. 04/21/14  Yes Maryann Mikhail, DO  carvedilol (COREG) 12.5 MG tablet Take 2 tablets (25 mg total) by mouth 2 (two) times daily with a meal. 03/09/14 10/25/15 Yes Oswald Hillock, MD  isosorbide mononitrate (IMDUR) 60 MG 24 hr tablet Take 60 mg by mouth daily. 07/21/12  Yes Orson Eva, MD  Multiple Vitamin (MULTIVITAMIN WITH MINERALS) TABS tablet Take 1 tablet by mouth daily as needed (Nutritional supplementation).    Yes Historical Provider, MD   BP 123/96  Pulse 95  Temp(Src) 97.6 F (36.4 C) (Oral)  Resp 18  Ht 5\' 9"  (1.753 m)  Wt 160 lb (72.576 kg)  BMI 23.62 kg/m2  SpO2 95% Physical Exam  Nursing note and vitals reviewed. Constitutional: He appears well-developed and well-nourished.  HENT:  Head: Normocephalic and atraumatic.  Mouth/Throat: Oropharynx is clear and moist.  Cardiovascular: Normal rate, regular rhythm and normal heart sounds.   Pulmonary/Chest: Effort normal. He has wheezes.  Diffuse expiratory wheezing  Abdominal: Soft. Bowel sounds are normal. He exhibits  no distension and no mass. There is tenderness in the right upper quadrant. There is no rebound and no guarding.  Musculoskeletal: Normal range of motion.  No LE edema bilaterally  Neurological: He is alert.  Skin: Skin is warm and dry.  Psychiatric: He has a normal mood and affect.    ED Course  Procedures (including critical care time) Labs Review Labs Reviewed  CBC WITH DIFFERENTIAL  COMPREHENSIVE METABOLIC PANEL  LIPASE, BLOOD  I-STAT [place], ED    Imaging Review Dg Chest 2 View  09/23/2014   CLINICAL DATA:  Acute onset of bilateral rib pain, worse on the right. Pain has been worsening with cough over the past 2 weeks. Initial encounter.  EXAM: CHEST  2 VIEW  COMPARISON:  Chest radiograph performed 04/18/2014  FINDINGS: The lungs are well-aerated. Minimal bibasilar densities appear stable from prior studies and appear to reflect the patient's baseline. Minimal left midlung atelectasis is noted. There is no evidence of pleural effusion or pneumothorax.  The heart is mildly enlarged. No acute osseous abnormalities are seen.  IMPRESSION: Minimal left midlung atelectasis noted; lungs otherwise clear. No displaced rib fractures identified. Mild cardiomegaly noted.   Electronically Signed   By: Garald Balding M.D.   On: 09/23/2014 07:16     EKG Interpretation   Date/Time:  Saturday September 23 2014 07:44:30 EDT Ventricular Rate:  68 PR Interval:  190 QRS Duration: 99 QT Interval:  487 QTC Calculation: 518 R Axis:   -97 Text Interpretation:  Sinus rhythm Left atrial enlargement Left anterior  fascicular block Consider right ventricular hypertrophy Consider left  ventricular hypertrophy Prolonged QT interval Confirmed by Rogene Houston  MD,  SCOTT ([id number]) on 09/23/2014 8:17:17 AM     10:36 AM Reassessed patient.  He reports some improvement in his breathing.  Diffuse wheezing heard on exam. 11:48 AM Reassessed patient.  He reports that his breathing is back to baseline.  Denies  SOB at this time.  He reports that his pain is controlled. MDM   Final diagnoses:  None   Patient with a history of ESRD, HTN, and CHF presents today with SOB.  Diffuse wheezing heard on exam.  Lungs sounds and SOB improved after given breathing treatment.  EKG unremarkable. Troponin negative. No acute findings on CXR.  No evidence of fluid overload on exam.  Labs unremarkable. Feel that the patient is stable for discharge. Return precautions given.    Hyman Bible, PA-C 09/25/14 1039

## 2014-09-23 NOTE — ED Notes (Signed)
Troponin results given to Dr. Zackowski 

## 2014-09-25 DIAGNOSIS — Z23 Encounter for immunization: Secondary | ICD-10-CM | POA: Diagnosis not present

## 2014-09-25 DIAGNOSIS — Z4931 Encounter for adequacy testing for hemodialysis: Secondary | ICD-10-CM | POA: Diagnosis not present

## 2014-09-25 DIAGNOSIS — N2581 Secondary hyperparathyroidism of renal origin: Secondary | ICD-10-CM | POA: Diagnosis not present

## 2014-09-25 DIAGNOSIS — N186 End stage renal disease: Secondary | ICD-10-CM | POA: Diagnosis not present

## 2014-09-25 DIAGNOSIS — D509 Iron deficiency anemia, unspecified: Secondary | ICD-10-CM | POA: Diagnosis not present

## 2014-09-27 DIAGNOSIS — Z23 Encounter for immunization: Secondary | ICD-10-CM | POA: Diagnosis not present

## 2014-09-27 DIAGNOSIS — N186 End stage renal disease: Secondary | ICD-10-CM | POA: Diagnosis not present

## 2014-09-27 DIAGNOSIS — Z4931 Encounter for adequacy testing for hemodialysis: Secondary | ICD-10-CM | POA: Diagnosis not present

## 2014-09-27 DIAGNOSIS — N2581 Secondary hyperparathyroidism of renal origin: Secondary | ICD-10-CM | POA: Diagnosis not present

## 2014-09-27 DIAGNOSIS — D509 Iron deficiency anemia, unspecified: Secondary | ICD-10-CM | POA: Diagnosis not present

## 2014-09-29 DIAGNOSIS — Z23 Encounter for immunization: Secondary | ICD-10-CM | POA: Diagnosis not present

## 2014-09-29 DIAGNOSIS — N186 End stage renal disease: Secondary | ICD-10-CM | POA: Diagnosis not present

## 2014-09-29 DIAGNOSIS — N2581 Secondary hyperparathyroidism of renal origin: Secondary | ICD-10-CM | POA: Diagnosis not present

## 2014-09-29 DIAGNOSIS — Z4931 Encounter for adequacy testing for hemodialysis: Secondary | ICD-10-CM | POA: Diagnosis not present

## 2014-09-29 DIAGNOSIS — D509 Iron deficiency anemia, unspecified: Secondary | ICD-10-CM | POA: Diagnosis not present

## 2014-10-02 DIAGNOSIS — Z4931 Encounter for adequacy testing for hemodialysis: Secondary | ICD-10-CM | POA: Diagnosis not present

## 2014-10-02 DIAGNOSIS — N2581 Secondary hyperparathyroidism of renal origin: Secondary | ICD-10-CM | POA: Diagnosis not present

## 2014-10-02 DIAGNOSIS — N186 End stage renal disease: Secondary | ICD-10-CM | POA: Diagnosis not present

## 2014-10-02 DIAGNOSIS — D509 Iron deficiency anemia, unspecified: Secondary | ICD-10-CM | POA: Diagnosis not present

## 2014-10-02 DIAGNOSIS — Z23 Encounter for immunization: Secondary | ICD-10-CM | POA: Diagnosis not present

## 2014-10-03 NOTE — ED Provider Notes (Signed)
Medical screening examination/treatment/procedure(s) were performed by non-physician practitioner and as supervising physician I was immediately available for consultation/collaboration.   EKG Interpretation   Date/Time:  Saturday September 23 2014 07:44:30 EDT Ventricular Rate:  68 PR Interval:  190 QRS Duration: 99 QT Interval:  487 QTC Calculation: 518 R Axis:   -97 Text Interpretation:  Sinus rhythm Left atrial enlargement Left anterior  fascicular block Consider right ventricular hypertrophy Consider left  ventricular hypertrophy Prolonged QT interval Confirmed by Rogene Houston  MD,  Dory Verdun ([id number]) on 09/23/2014 8:17:17 AM        Fredia Sorrow, MD 10/03/14 [phone]

## 2014-10-04 DIAGNOSIS — N2581 Secondary hyperparathyroidism of renal origin: Secondary | ICD-10-CM | POA: Diagnosis not present

## 2014-10-04 DIAGNOSIS — N186 End stage renal disease: Secondary | ICD-10-CM | POA: Diagnosis not present

## 2014-10-04 DIAGNOSIS — D509 Iron deficiency anemia, unspecified: Secondary | ICD-10-CM | POA: Diagnosis not present

## 2014-10-04 DIAGNOSIS — Z4931 Encounter for adequacy testing for hemodialysis: Secondary | ICD-10-CM | POA: Diagnosis not present

## 2014-10-04 DIAGNOSIS — Z23 Encounter for immunization: Secondary | ICD-10-CM | POA: Diagnosis not present

## 2014-10-06 DIAGNOSIS — N186 End stage renal disease: Secondary | ICD-10-CM | POA: Diagnosis not present

## 2014-10-06 DIAGNOSIS — D509 Iron deficiency anemia, unspecified: Secondary | ICD-10-CM | POA: Diagnosis not present

## 2014-10-06 DIAGNOSIS — Z23 Encounter for immunization: Secondary | ICD-10-CM | POA: Diagnosis not present

## 2014-10-06 DIAGNOSIS — Z4931 Encounter for adequacy testing for hemodialysis: Secondary | ICD-10-CM | POA: Diagnosis not present

## 2014-10-06 DIAGNOSIS — N2581 Secondary hyperparathyroidism of renal origin: Secondary | ICD-10-CM | POA: Diagnosis not present

## 2014-10-09 DIAGNOSIS — N2581 Secondary hyperparathyroidism of renal origin: Secondary | ICD-10-CM | POA: Diagnosis not present

## 2014-10-09 DIAGNOSIS — Z23 Encounter for immunization: Secondary | ICD-10-CM | POA: Diagnosis not present

## 2014-10-09 DIAGNOSIS — Z4931 Encounter for adequacy testing for hemodialysis: Secondary | ICD-10-CM | POA: Diagnosis not present

## 2014-10-09 DIAGNOSIS — N186 End stage renal disease: Secondary | ICD-10-CM | POA: Diagnosis not present

## 2014-10-09 DIAGNOSIS — D509 Iron deficiency anemia, unspecified: Secondary | ICD-10-CM | POA: Diagnosis not present

## 2014-10-11 DIAGNOSIS — D509 Iron deficiency anemia, unspecified: Secondary | ICD-10-CM | POA: Diagnosis not present

## 2014-10-11 DIAGNOSIS — Z23 Encounter for immunization: Secondary | ICD-10-CM | POA: Diagnosis not present

## 2014-10-11 DIAGNOSIS — Z4931 Encounter for adequacy testing for hemodialysis: Secondary | ICD-10-CM | POA: Diagnosis not present

## 2014-10-11 DIAGNOSIS — N186 End stage renal disease: Secondary | ICD-10-CM | POA: Diagnosis not present

## 2014-10-11 DIAGNOSIS — N2581 Secondary hyperparathyroidism of renal origin: Secondary | ICD-10-CM | POA: Diagnosis not present

## 2014-10-13 DIAGNOSIS — N2581 Secondary hyperparathyroidism of renal origin: Secondary | ICD-10-CM | POA: Diagnosis not present

## 2014-10-13 DIAGNOSIS — Z23 Encounter for immunization: Secondary | ICD-10-CM | POA: Diagnosis not present

## 2014-10-13 DIAGNOSIS — Z4931 Encounter for adequacy testing for hemodialysis: Secondary | ICD-10-CM | POA: Diagnosis not present

## 2014-10-13 DIAGNOSIS — N186 End stage renal disease: Secondary | ICD-10-CM | POA: Diagnosis not present

## 2014-10-13 DIAGNOSIS — D509 Iron deficiency anemia, unspecified: Secondary | ICD-10-CM | POA: Diagnosis not present

## 2014-10-14 DIAGNOSIS — Z992 Dependence on renal dialysis: Secondary | ICD-10-CM | POA: Diagnosis not present

## 2014-10-14 DIAGNOSIS — N186 End stage renal disease: Secondary | ICD-10-CM | POA: Diagnosis not present

## 2014-10-17 DIAGNOSIS — D509 Iron deficiency anemia, unspecified: Secondary | ICD-10-CM | POA: Diagnosis not present

## 2014-10-17 DIAGNOSIS — N2581 Secondary hyperparathyroidism of renal origin: Secondary | ICD-10-CM | POA: Diagnosis not present

## 2014-10-17 DIAGNOSIS — N186 End stage renal disease: Secondary | ICD-10-CM | POA: Diagnosis not present

## 2014-10-18 DIAGNOSIS — N186 End stage renal disease: Secondary | ICD-10-CM | POA: Diagnosis not present

## 2014-10-18 DIAGNOSIS — D509 Iron deficiency anemia, unspecified: Secondary | ICD-10-CM | POA: Diagnosis not present

## 2014-10-18 DIAGNOSIS — N2581 Secondary hyperparathyroidism of renal origin: Secondary | ICD-10-CM | POA: Diagnosis not present

## 2014-10-20 DIAGNOSIS — N186 End stage renal disease: Secondary | ICD-10-CM | POA: Diagnosis not present

## 2014-10-20 DIAGNOSIS — D509 Iron deficiency anemia, unspecified: Secondary | ICD-10-CM | POA: Diagnosis not present

## 2014-10-20 DIAGNOSIS — N2581 Secondary hyperparathyroidism of renal origin: Secondary | ICD-10-CM | POA: Diagnosis not present

## 2014-10-23 DIAGNOSIS — N186 End stage renal disease: Secondary | ICD-10-CM | POA: Diagnosis not present

## 2014-10-23 DIAGNOSIS — D509 Iron deficiency anemia, unspecified: Secondary | ICD-10-CM | POA: Diagnosis not present

## 2014-10-23 DIAGNOSIS — N2581 Secondary hyperparathyroidism of renal origin: Secondary | ICD-10-CM | POA: Diagnosis not present

## 2014-10-25 DIAGNOSIS — N2581 Secondary hyperparathyroidism of renal origin: Secondary | ICD-10-CM | POA: Diagnosis not present

## 2014-10-25 DIAGNOSIS — D509 Iron deficiency anemia, unspecified: Secondary | ICD-10-CM | POA: Diagnosis not present

## 2014-10-25 DIAGNOSIS — N186 End stage renal disease: Secondary | ICD-10-CM | POA: Diagnosis not present

## 2014-10-27 DIAGNOSIS — N186 End stage renal disease: Secondary | ICD-10-CM | POA: Diagnosis not present

## 2014-10-27 DIAGNOSIS — D509 Iron deficiency anemia, unspecified: Secondary | ICD-10-CM | POA: Diagnosis not present

## 2014-10-27 DIAGNOSIS — N2581 Secondary hyperparathyroidism of renal origin: Secondary | ICD-10-CM | POA: Diagnosis not present

## 2014-10-30 DIAGNOSIS — N2581 Secondary hyperparathyroidism of renal origin: Secondary | ICD-10-CM | POA: Diagnosis not present

## 2014-10-30 DIAGNOSIS — D509 Iron deficiency anemia, unspecified: Secondary | ICD-10-CM | POA: Diagnosis not present

## 2014-10-30 DIAGNOSIS — N186 End stage renal disease: Secondary | ICD-10-CM | POA: Diagnosis not present

## 2014-11-01 DIAGNOSIS — D509 Iron deficiency anemia, unspecified: Secondary | ICD-10-CM | POA: Diagnosis not present

## 2014-11-01 DIAGNOSIS — N186 End stage renal disease: Secondary | ICD-10-CM | POA: Diagnosis not present

## 2014-11-01 DIAGNOSIS — N2581 Secondary hyperparathyroidism of renal origin: Secondary | ICD-10-CM | POA: Diagnosis not present

## 2014-11-03 DIAGNOSIS — D509 Iron deficiency anemia, unspecified: Secondary | ICD-10-CM | POA: Diagnosis not present

## 2014-11-03 DIAGNOSIS — N2581 Secondary hyperparathyroidism of renal origin: Secondary | ICD-10-CM | POA: Diagnosis not present

## 2014-11-03 DIAGNOSIS — N186 End stage renal disease: Secondary | ICD-10-CM | POA: Diagnosis not present

## 2014-11-07 DIAGNOSIS — D509 Iron deficiency anemia, unspecified: Secondary | ICD-10-CM | POA: Diagnosis not present

## 2014-11-07 DIAGNOSIS — N186 End stage renal disease: Secondary | ICD-10-CM | POA: Diagnosis not present

## 2014-11-07 DIAGNOSIS — N2581 Secondary hyperparathyroidism of renal origin: Secondary | ICD-10-CM | POA: Diagnosis not present

## 2014-11-10 DIAGNOSIS — N2581 Secondary hyperparathyroidism of renal origin: Secondary | ICD-10-CM | POA: Diagnosis not present

## 2014-11-10 DIAGNOSIS — N186 End stage renal disease: Secondary | ICD-10-CM | POA: Diagnosis not present

## 2014-11-10 DIAGNOSIS — D509 Iron deficiency anemia, unspecified: Secondary | ICD-10-CM | POA: Diagnosis not present

## 2014-11-13 DIAGNOSIS — N186 End stage renal disease: Secondary | ICD-10-CM | POA: Diagnosis not present

## 2014-11-13 DIAGNOSIS — N2581 Secondary hyperparathyroidism of renal origin: Secondary | ICD-10-CM | POA: Diagnosis not present

## 2014-11-13 DIAGNOSIS — Z992 Dependence on renal dialysis: Secondary | ICD-10-CM | POA: Diagnosis not present

## 2014-11-13 DIAGNOSIS — D509 Iron deficiency anemia, unspecified: Secondary | ICD-10-CM | POA: Diagnosis not present

## 2014-11-15 DIAGNOSIS — N186 End stage renal disease: Secondary | ICD-10-CM | POA: Diagnosis not present

## 2014-11-15 DIAGNOSIS — N2581 Secondary hyperparathyroidism of renal origin: Secondary | ICD-10-CM | POA: Diagnosis not present

## 2014-11-15 DIAGNOSIS — D509 Iron deficiency anemia, unspecified: Secondary | ICD-10-CM | POA: Diagnosis not present

## 2014-11-15 DIAGNOSIS — D631 Anemia in chronic kidney disease: Secondary | ICD-10-CM | POA: Diagnosis not present

## 2014-11-23 ENCOUNTER — Encounter (HOSPITAL_COMMUNITY): Payer: Self-pay | Admitting: Vascular Surgery

## 2014-12-14 DIAGNOSIS — Z992 Dependence on renal dialysis: Secondary | ICD-10-CM | POA: Diagnosis not present

## 2014-12-14 DIAGNOSIS — N186 End stage renal disease: Secondary | ICD-10-CM | POA: Diagnosis not present

## 2014-12-18 DIAGNOSIS — D631 Anemia in chronic kidney disease: Secondary | ICD-10-CM | POA: Diagnosis not present

## 2014-12-18 DIAGNOSIS — D509 Iron deficiency anemia, unspecified: Secondary | ICD-10-CM | POA: Diagnosis not present

## 2014-12-18 DIAGNOSIS — N186 End stage renal disease: Secondary | ICD-10-CM | POA: Diagnosis not present

## 2014-12-18 DIAGNOSIS — N2581 Secondary hyperparathyroidism of renal origin: Secondary | ICD-10-CM | POA: Diagnosis not present

## 2014-12-20 DIAGNOSIS — D631 Anemia in chronic kidney disease: Secondary | ICD-10-CM | POA: Diagnosis not present

## 2014-12-20 DIAGNOSIS — D509 Iron deficiency anemia, unspecified: Secondary | ICD-10-CM | POA: Diagnosis not present

## 2014-12-20 DIAGNOSIS — N2581 Secondary hyperparathyroidism of renal origin: Secondary | ICD-10-CM | POA: Diagnosis not present

## 2014-12-20 DIAGNOSIS — N186 End stage renal disease: Secondary | ICD-10-CM | POA: Diagnosis not present

## 2014-12-22 DIAGNOSIS — D509 Iron deficiency anemia, unspecified: Secondary | ICD-10-CM | POA: Diagnosis not present

## 2014-12-22 DIAGNOSIS — D631 Anemia in chronic kidney disease: Secondary | ICD-10-CM | POA: Diagnosis not present

## 2014-12-22 DIAGNOSIS — N186 End stage renal disease: Secondary | ICD-10-CM | POA: Diagnosis not present

## 2014-12-22 DIAGNOSIS — N2581 Secondary hyperparathyroidism of renal origin: Secondary | ICD-10-CM | POA: Diagnosis not present

## 2014-12-25 DIAGNOSIS — N2581 Secondary hyperparathyroidism of renal origin: Secondary | ICD-10-CM | POA: Diagnosis not present

## 2014-12-25 DIAGNOSIS — N186 End stage renal disease: Secondary | ICD-10-CM | POA: Diagnosis not present

## 2014-12-25 DIAGNOSIS — D631 Anemia in chronic kidney disease: Secondary | ICD-10-CM | POA: Diagnosis not present

## 2014-12-25 DIAGNOSIS — D509 Iron deficiency anemia, unspecified: Secondary | ICD-10-CM | POA: Diagnosis not present

## 2014-12-27 DIAGNOSIS — N2581 Secondary hyperparathyroidism of renal origin: Secondary | ICD-10-CM | POA: Diagnosis not present

## 2014-12-27 DIAGNOSIS — N186 End stage renal disease: Secondary | ICD-10-CM | POA: Diagnosis not present

## 2014-12-27 DIAGNOSIS — D631 Anemia in chronic kidney disease: Secondary | ICD-10-CM | POA: Diagnosis not present

## 2014-12-27 DIAGNOSIS — D509 Iron deficiency anemia, unspecified: Secondary | ICD-10-CM | POA: Diagnosis not present

## 2014-12-29 DIAGNOSIS — N186 End stage renal disease: Secondary | ICD-10-CM | POA: Diagnosis not present

## 2014-12-29 DIAGNOSIS — D509 Iron deficiency anemia, unspecified: Secondary | ICD-10-CM | POA: Diagnosis not present

## 2014-12-29 DIAGNOSIS — D631 Anemia in chronic kidney disease: Secondary | ICD-10-CM | POA: Diagnosis not present

## 2014-12-29 DIAGNOSIS — N2581 Secondary hyperparathyroidism of renal origin: Secondary | ICD-10-CM | POA: Diagnosis not present

## 2015-01-01 DIAGNOSIS — D509 Iron deficiency anemia, unspecified: Secondary | ICD-10-CM | POA: Diagnosis not present

## 2015-01-01 DIAGNOSIS — N2581 Secondary hyperparathyroidism of renal origin: Secondary | ICD-10-CM | POA: Diagnosis not present

## 2015-01-01 DIAGNOSIS — N186 End stage renal disease: Secondary | ICD-10-CM | POA: Diagnosis not present

## 2015-01-01 DIAGNOSIS — D631 Anemia in chronic kidney disease: Secondary | ICD-10-CM | POA: Diagnosis not present

## 2015-01-03 DIAGNOSIS — N2581 Secondary hyperparathyroidism of renal origin: Secondary | ICD-10-CM | POA: Diagnosis not present

## 2015-01-03 DIAGNOSIS — N186 End stage renal disease: Secondary | ICD-10-CM | POA: Diagnosis not present

## 2015-01-03 DIAGNOSIS — D631 Anemia in chronic kidney disease: Secondary | ICD-10-CM | POA: Diagnosis not present

## 2015-01-03 DIAGNOSIS — D509 Iron deficiency anemia, unspecified: Secondary | ICD-10-CM | POA: Diagnosis not present

## 2015-01-08 DIAGNOSIS — N186 End stage renal disease: Secondary | ICD-10-CM | POA: Diagnosis not present

## 2015-01-08 DIAGNOSIS — D631 Anemia in chronic kidney disease: Secondary | ICD-10-CM | POA: Diagnosis not present

## 2015-01-08 DIAGNOSIS — N2581 Secondary hyperparathyroidism of renal origin: Secondary | ICD-10-CM | POA: Diagnosis not present

## 2015-01-08 DIAGNOSIS — D509 Iron deficiency anemia, unspecified: Secondary | ICD-10-CM | POA: Diagnosis not present

## 2015-01-10 DIAGNOSIS — D509 Iron deficiency anemia, unspecified: Secondary | ICD-10-CM | POA: Diagnosis not present

## 2015-01-10 DIAGNOSIS — N186 End stage renal disease: Secondary | ICD-10-CM | POA: Diagnosis not present

## 2015-01-10 DIAGNOSIS — N2581 Secondary hyperparathyroidism of renal origin: Secondary | ICD-10-CM | POA: Diagnosis not present

## 2015-01-10 DIAGNOSIS — D631 Anemia in chronic kidney disease: Secondary | ICD-10-CM | POA: Diagnosis not present

## 2015-01-12 DIAGNOSIS — N186 End stage renal disease: Secondary | ICD-10-CM | POA: Diagnosis not present

## 2015-01-12 DIAGNOSIS — D509 Iron deficiency anemia, unspecified: Secondary | ICD-10-CM | POA: Diagnosis not present

## 2015-01-12 DIAGNOSIS — D631 Anemia in chronic kidney disease: Secondary | ICD-10-CM | POA: Diagnosis not present

## 2015-01-12 DIAGNOSIS — N2581 Secondary hyperparathyroidism of renal origin: Secondary | ICD-10-CM | POA: Diagnosis not present

## 2015-01-14 DIAGNOSIS — N186 End stage renal disease: Secondary | ICD-10-CM | POA: Diagnosis not present

## 2015-01-14 DIAGNOSIS — Z992 Dependence on renal dialysis: Secondary | ICD-10-CM | POA: Diagnosis not present

## 2015-01-15 DIAGNOSIS — D509 Iron deficiency anemia, unspecified: Secondary | ICD-10-CM | POA: Diagnosis not present

## 2015-01-15 DIAGNOSIS — N186 End stage renal disease: Secondary | ICD-10-CM | POA: Diagnosis not present

## 2015-01-15 DIAGNOSIS — N2581 Secondary hyperparathyroidism of renal origin: Secondary | ICD-10-CM | POA: Diagnosis not present

## 2015-02-12 DIAGNOSIS — N186 End stage renal disease: Secondary | ICD-10-CM | POA: Diagnosis not present

## 2015-02-12 DIAGNOSIS — Z992 Dependence on renal dialysis: Secondary | ICD-10-CM | POA: Diagnosis not present

## 2015-02-14 DIAGNOSIS — N186 End stage renal disease: Secondary | ICD-10-CM | POA: Diagnosis not present

## 2015-02-14 DIAGNOSIS — D509 Iron deficiency anemia, unspecified: Secondary | ICD-10-CM | POA: Diagnosis not present

## 2015-02-14 DIAGNOSIS — N2581 Secondary hyperparathyroidism of renal origin: Secondary | ICD-10-CM | POA: Diagnosis not present

## 2015-02-26 ENCOUNTER — Encounter (HOSPITAL_COMMUNITY): Payer: Self-pay | Admitting: Emergency Medicine

## 2015-02-26 ENCOUNTER — Emergency Department (HOSPITAL_COMMUNITY)
Admission: EM | Admit: 2015-02-26 | Discharge: 2015-02-26 | Disposition: A | Discharge status: Home / Self Care | Payer: Medicare Other | Attending: Emergency Medicine | Admitting: Emergency Medicine

## 2015-02-26 ENCOUNTER — Emergency Department (HOSPITAL_COMMUNITY): Payer: Medicare Other

## 2015-02-26 DIAGNOSIS — I120 Hypertensive chronic kidney disease with stage 5 chronic kidney disease or end stage renal disease: Secondary | ICD-10-CM | POA: Diagnosis not present

## 2015-02-26 DIAGNOSIS — N186 End stage renal disease: Secondary | ICD-10-CM | POA: Insufficient documentation

## 2015-02-26 DIAGNOSIS — R0781 Pleurodynia: Secondary | ICD-10-CM | POA: Diagnosis not present

## 2015-02-26 DIAGNOSIS — Z992 Dependence on renal dialysis: Secondary | ICD-10-CM | POA: Diagnosis not present

## 2015-02-26 DIAGNOSIS — R109 Unspecified abdominal pain: Secondary | ICD-10-CM | POA: Diagnosis present

## 2015-02-26 DIAGNOSIS — Z79899 Other long term (current) drug therapy: Secondary | ICD-10-CM | POA: Diagnosis not present

## 2015-02-26 DIAGNOSIS — R079 Chest pain, unspecified: Secondary | ICD-10-CM | POA: Diagnosis not present

## 2015-02-26 DIAGNOSIS — I509 Heart failure, unspecified: Secondary | ICD-10-CM | POA: Insufficient documentation

## 2015-02-26 DIAGNOSIS — Z7952 Long term (current) use of systemic steroids: Secondary | ICD-10-CM | POA: Insufficient documentation

## 2015-02-26 DIAGNOSIS — I517 Cardiomegaly: Secondary | ICD-10-CM | POA: Diagnosis not present

## 2015-02-26 DIAGNOSIS — R0789 Other chest pain: Secondary | ICD-10-CM | POA: Diagnosis not present

## 2015-02-26 LAB — COMPREHENSIVE METABOLIC PANEL
ALBUMIN: 3.5 g/dL (ref 3.5–5.2)
ALT: 11 U/L (ref 0–53)
AST: 21 U/L (ref 0–37)
Alkaline Phosphatase: 66 U/L (ref 39–117)
Anion gap: 16 — ABNORMAL HIGH (ref 5–15)
BILIRUBIN TOTAL: 1 mg/dL (ref 0.3–1.2)
BUN: 27 mg/dL — ABNORMAL HIGH (ref 6–23)
CHLORIDE: 95 mmol/L — AB (ref 96–112)
CO2: 29 mmol/L (ref 19–32)
Calcium: 8.6 mg/dL (ref 8.4–10.5)
Creatinine, Ser: 7.2 mg/dL — ABNORMAL HIGH (ref 0.50–1.35)
GFR calc Af Amer: 9 mL/min — ABNORMAL LOW (ref >90)
GFR calc non Af Amer: 8 mL/min — ABNORMAL LOW (ref >90)
Glucose, Bld: 159 mg/dL — ABNORMAL HIGH (ref 70–99)
Potassium: 3.7 mmol/L (ref 3.5–5.1)
SODIUM: 140 mmol/L (ref 135–145)
Total Protein: 7.5 g/dL (ref 6.0–8.3)

## 2015-02-26 LAB — CBC WITH DIFFERENTIAL/PLATELET
BASOS ABS: 0 10*3/uL (ref 0.0–0.1)
BASOS PCT: 1 % (ref 0–1)
Eosinophils Absolute: 0.2 10*3/uL (ref 0.0–0.7)
Eosinophils Relative: 4 % (ref 0–5)
HEMATOCRIT: 36.6 % — AB (ref 39.0–52.0)
Hemoglobin: 12.2 g/dL — ABNORMAL LOW (ref 13.0–17.0)
LYMPHS PCT: 21 % (ref 12–46)
Lymphs Abs: 0.9 10*3/uL (ref 0.7–4.0)
MCH: 30.6 pg (ref 26.0–34.0)
MCHC: 33.3 g/dL (ref 30.0–36.0)
MCV: 91.7 fL (ref 78.0–100.0)
MONO ABS: 0.6 10*3/uL (ref 0.1–1.0)
Monocytes Relative: 13 % — ABNORMAL HIGH (ref 3–12)
NEUTROS ABS: 2.7 10*3/uL (ref 1.7–7.7)
NEUTROS PCT: 61 % (ref 43–77)
Platelets: 200 10*3/uL (ref 150–400)
RBC: 3.99 MIL/uL — ABNORMAL LOW (ref 4.22–5.81)
RDW: 15.4 % (ref 11.5–15.5)
WBC: 4.3 10*3/uL (ref 4.0–10.5)

## 2015-02-26 LAB — LIPASE, BLOOD: LIPASE: 23 U/L (ref 11–59)

## 2015-02-26 MED ORDER — OXYCODONE-ACETAMINOPHEN 5-325 MG PO TABS
1.0000 | ORAL_TABLET | Freq: Once | ORAL | Status: AC
  Administered 2015-02-26: 1 via ORAL
  Filled 2015-02-26: qty 1

## 2015-02-26 NOTE — Discharge Instructions (Signed)

## 2015-02-26 NOTE — ED Notes (Signed)
Pt c/o RUQ pain x 3 weeks; pt sts dialysis pt with last dialysis today

## 2015-02-26 NOTE — ED Provider Notes (Signed)
CSN: [id number]     Arrival date & time 02/26/15  1238 History   First MD Initiated Contact with Patient 02/26/15 1547     Chief Complaint  Patient presents with  . Abdominal Pain     (Consider location/radiation/quality/duration/timing/severity/associated sxs/prior Treatment) Patient is a 49 y.o. male presenting with abdominal pain and chest pain.  Abdominal Pain Associated symptoms: chest pain   Associated symptoms: no chills, no cough, no diarrhea, no dysuria, no fever, no nausea, no shortness of breath and no vomiting   Chest Pain Pain location:  R chest Pain quality: sharp   Pain radiates to:  Does not radiate Pain radiates to the back: no   Pain severity:  Unable to specify Duration:  1 month Timing:  Constant Progression:  Waxing and waning Context: breathing, movement and at rest   Relieved by:  None tried Worsened by:  Movement Ineffective treatments:  None tried Associated symptoms: abdominal pain   Associated symptoms: no cough, no fever, no headache, no nausea, no shortness of breath and not vomiting   Risk factors: hypertension     Past Medical History  Diagnosis Date  . Hypertension   . CKD (chronic kidney disease) stage 4, GFR 15-29 ml/min   . Systolic CHF     Ef [id number]  . Medical non-compliance   . Polysubstance abuse   . ESRD (end stage renal disease)    Past Surgical History  Procedure Laterality Date  . Insertion of dialysis catheter  07/17/2012    Procedure: INSERTION OF DIALYSIS CATHETER;  Surgeon: Conrad [place], MD;  Location: [place];  Service: Vascular;  Laterality: Right;  right internal jugular  . Bascilic vein transposition  01/19/2013    Procedure: BASCILIC VEIN TRANSPOSITION;  Surgeon: Conrad Bearden, MD;  Location: [place];  Service: Vascular;  Laterality: Left;  left 2nd stage basilic vein transposition  . Venogram N/A 08/09/2012    Procedure: VENOGRAM;  Surgeon: Conrad Monroe, MD;  Location: [facility] CATH LAB;  Service: Cardiovascular;  Laterality: N/A;    Family History  Problem Relation Age of Onset  . Hypertension Mother   . Diabetes Mother   . Hypertension Father    History  Substance Use Topics  . Smoking status: Current Every Day Smoker -- 0.25 packs/day    Types: Cigarettes    Last Attempt to Quit: 08/09/2012  . Smokeless tobacco: Not on file     Comment: Trying to quit; 3 cigs/last 2 weeks  . Alcohol Use: 4.2 oz/week    7 Cans of beer per week     Comment: Patient not forthcoming.  2-3 beers per week    Review of Systems  Constitutional: Negative for fever and chills.  Eyes: Negative for redness.  Respiratory: Negative for cough and shortness of breath.   Cardiovascular: Positive for chest pain. Negative for leg swelling.  Gastrointestinal: Positive for abdominal pain. Negative for nausea, vomiting and diarrhea.  Genitourinary: Negative for dysuria.  Skin: Negative for rash.  Neurological: Negative for headaches.  All other systems reviewed and are negative.     Allergies  Review of patient's allergies indicates no known allergies.  Home Medications   Prior to Admission medications   Medication Sig Start Date End Date Taking? Authorizing Provider  albuterol (PROVENTIL HFA;VENTOLIN HFA) 108 (90 BASE) MCG/ACT inhaler Inhale 1-2 puffs into the lungs every 6 (six) hours as needed for wheezing or shortness of breath. 09/23/14  Yes Heather Laisure, PA-C  calcium acetate (PHOSLO) 667 MG  capsule Take 3 capsules (2,001 mg total) by mouth 3 (three) times daily with meals. 04/21/14  Yes Maryann Mikhail, DO  carvedilol (COREG) 12.5 MG tablet Take 2 tablets (25 mg total) by mouth 2 (two) times daily with a meal. 03/09/14 10/25/15 Yes Oswald Hillock, MD  isosorbide mononitrate (IMDUR) 60 MG 24 hr tablet Take 60 mg by mouth daily. 07/21/12  Yes Orson Eva, MD  lisinopril (PRINIVIL,ZESTRIL) 40 MG tablet Take 40 mg by mouth at bedtime. 01/29/15  Yes Historical Provider, MD  Multiple Vitamin (MULTIVITAMIN WITH MINERALS) TABS tablet Take 1  tablet by mouth daily as needed (Nutritional supplementation).    Yes Historical Provider, MD  predniSONE (DELTASONE) 20 MG tablet Take 3 tablets (60 mg total) by mouth daily. 09/23/14   Heather Laisure, PA-C   BP 156/106 mmHg  Pulse 87  Temp(Src) 98.4 F (36.9 C) (Oral)  Resp 15  Ht 5' 9.5" (1.765 m)  Wt 164 lb 5 oz (74.532 kg)  BMI 23.93 kg/m2  SpO2 93% Physical Exam  Constitutional: He is oriented to person, place, and time. No distress.  HENT:  Head: Normocephalic and atraumatic.  Eyes: EOM are normal. Pupils are equal, round, and reactive to light.  Neck: Normal range of motion. Neck supple.  Cardiovascular: Normal rate.   Pulmonary/Chest: Effort normal. No respiratory distress. He exhibits tenderness (right lower chest wall tenderness to palpation).  Abdominal: Soft. There is no tenderness.  Musculoskeletal: Normal range of motion.  Neurological: He is alert and oriented to person, place, and time.  Skin: No rash noted. He is not diaphoretic.  Psychiatric: He has a normal mood and affect.    ED Course  Procedures (including critical care time) Labs Review Labs Reviewed  CBC WITH DIFFERENTIAL/PLATELET - Abnormal; Notable for the following:    RBC 3.99 (*)    Hemoglobin 12.2 (*)    HCT 36.6 (*)    Monocytes Relative 13 (*)    All other components within normal limits  COMPREHENSIVE METABOLIC PANEL - Abnormal; Notable for the following:    Chloride 95 (*)    Glucose, Bld 159 (*)    BUN 27 (*)    Creatinine, Ser 7.20 (*)    GFR calc non Af Amer 8 (*)    GFR calc Af Amer 9 (*)    Anion gap 16 (*)    All other components within normal limits  LIPASE, BLOOD    Imaging Review Dg Chest 2 View  02/26/2015   CLINICAL DATA:  49 year old male with 3 week history of right lower rib pain  EXAM: CHEST  2 VIEW  COMPARISON:  Prior chest x-ray 09/23/2014  FINDINGS: Stable mild cardiomegaly. The lungs are clear. No evidence of focal airspace consolidation, pulmonary edema,  pleural effusion or pneumothorax. No acute osseous abnormality.  IMPRESSION: Stable cardiomegaly.  No acute cardiopulmonary process.   Electronically Signed   By: Jacqulynn Cadet M.D.   On: 02/26/2015 16:25     EKG Interpretation   Date/Time:  Monday February 26 2015 16:27:54 EDT Ventricular Rate:  82 PR Interval:  191 QRS Duration: 100 QT Interval:  422 QTC Calculation: 493 R Axis:   -84 Text Interpretation:  Sinus rhythm Ventricular premature complex Probable  left atrial enlargement Left anterior fascicular block Abnormal R-wave  progression, late transition Left ventricular hypertrophy Nonspecific T  abnormalities, lateral leads Borderline prolonged QT interval nonspecific  changes as compared to prior ecg Confirmed by CAMPOS  MD, KEVIN ([id number]) on  02/26/2015 5:21:09  PM      MDM   Final diagnoses:  Right-sided chest pain    49 y/o male with PMH ESRD, CHF, HTN.  Dialysis M/W/F, last dialysis today.  He presents today with one month of right lower chest wall pain.  The pain is sharp, worse with movement, worse when he pushes on his chest.  The pain is also worse with taking a deep breath.  No shortness of breath, no abdominal pain.  No nausea/vomiting/diarrhea/dysuria/fever.  Exam as above, patient is afebrile, HDS w/ a heart rate of 91 and satting 99% on room air.  Doubt PE given no leg swelling, no previous dvt/pe, no recent surgeries or hx of cancer.  Heart rate 91 and satting 99% on room air. Doubt ACS given this very atypical sounding pain that has been going on for the past month but will get an EKG to further eval.  Will also get a CXR to rule out PTX.  Patient obtained cbc/cmp/lipase in the waiting room which were unremarkable, w/o need for acute dialysis.  Will give percocet for pain.  ekg w/o any new ischemic changes, CXR w/o PTX other acute abnormality.    I have discussed the results, Dx and Tx plan with the patient. They expressed understanding and agree with the  plan and were told to return to ED with any worsening of condition or concern.    Disposition: Discharge  Condition: Good  Discharge Medication List as of 02/26/2015  5:18 PM      Follow Up: [facility] [address] [id number] [place] [phone]  If symptoms worsen   Pt seen in conjunction with Dr. Alecia Lemming, MD 02/27/15 [id number]  Jola Schmidt, MD 02/28/15 [phone]

## 2015-03-15 DIAGNOSIS — Z992 Dependence on renal dialysis: Secondary | ICD-10-CM | POA: Diagnosis not present

## 2015-03-15 DIAGNOSIS — N186 End stage renal disease: Secondary | ICD-10-CM | POA: Diagnosis not present

## 2015-03-15 DIAGNOSIS — I120 Hypertensive chronic kidney disease with stage 5 chronic kidney disease or end stage renal disease: Secondary | ICD-10-CM | POA: Diagnosis not present

## 2015-03-16 DIAGNOSIS — N186 End stage renal disease: Secondary | ICD-10-CM | POA: Diagnosis not present

## 2015-03-16 DIAGNOSIS — N2581 Secondary hyperparathyroidism of renal origin: Secondary | ICD-10-CM | POA: Diagnosis not present

## 2015-03-16 DIAGNOSIS — D509 Iron deficiency anemia, unspecified: Secondary | ICD-10-CM | POA: Diagnosis not present

## 2015-04-10 ENCOUNTER — Emergency Department (HOSPITAL_COMMUNITY)
Admission: EM | Admit: 2015-04-10 | Discharge: 2015-04-10 | Disposition: A | Discharge status: Home / Self Care | Payer: Medicare Other | Attending: Emergency Medicine | Admitting: Emergency Medicine

## 2015-04-10 ENCOUNTER — Encounter (HOSPITAL_COMMUNITY): Payer: Self-pay

## 2015-04-10 DIAGNOSIS — Z992 Dependence on renal dialysis: Secondary | ICD-10-CM | POA: Insufficient documentation

## 2015-04-10 DIAGNOSIS — I5020 Unspecified systolic (congestive) heart failure: Secondary | ICD-10-CM | POA: Diagnosis not present

## 2015-04-10 DIAGNOSIS — X58XXXA Exposure to other specified factors, initial encounter: Secondary | ICD-10-CM | POA: Diagnosis not present

## 2015-04-10 DIAGNOSIS — Z720 Tobacco use: Secondary | ICD-10-CM | POA: Insufficient documentation

## 2015-04-10 DIAGNOSIS — N186 End stage renal disease: Secondary | ICD-10-CM | POA: Diagnosis not present

## 2015-04-10 DIAGNOSIS — Y939 Activity, unspecified: Secondary | ICD-10-CM | POA: Insufficient documentation

## 2015-04-10 DIAGNOSIS — Z7952 Long term (current) use of systemic steroids: Secondary | ICD-10-CM | POA: Diagnosis not present

## 2015-04-10 DIAGNOSIS — Y999 Unspecified external cause status: Secondary | ICD-10-CM | POA: Insufficient documentation

## 2015-04-10 DIAGNOSIS — Y929 Unspecified place or not applicable: Secondary | ICD-10-CM | POA: Diagnosis not present

## 2015-04-10 DIAGNOSIS — S0502XA Injury of conjunctiva and corneal abrasion without foreign body, left eye, initial encounter: Secondary | ICD-10-CM

## 2015-04-10 DIAGNOSIS — R109 Unspecified abdominal pain: Secondary | ICD-10-CM | POA: Diagnosis not present

## 2015-04-10 DIAGNOSIS — S0592XA Unspecified injury of left eye and orbit, initial encounter: Secondary | ICD-10-CM | POA: Diagnosis present

## 2015-04-10 DIAGNOSIS — Z79899 Other long term (current) drug therapy: Secondary | ICD-10-CM | POA: Insufficient documentation

## 2015-04-10 DIAGNOSIS — I120 Hypertensive chronic kidney disease with stage 5 chronic kidney disease or end stage renal disease: Secondary | ICD-10-CM | POA: Insufficient documentation

## 2015-04-10 MED ORDER — TETRACAINE HCL 0.5 % OP SOLN
1.0000 [drp] | Freq: Once | OPHTHALMIC | Status: AC
  Administered 2015-04-10: 1 [drp] via OPHTHALMIC
  Filled 2015-04-10: qty 2

## 2015-04-10 MED ORDER — POLYMYXIN B-TRIMETHOPRIM 10000-0.1 UNIT/ML-% OP SOLN: 1.0000 [drp] | OPHTHALMIC | Status: DC

## 2015-04-10 MED ORDER — FLUORESCEIN SODIUM 1 MG OP STRP
1.0000 | ORAL_STRIP | Freq: Once | OPHTHALMIC | Status: AC
  Administered 2015-04-10: 1 via OPHTHALMIC
  Filled 2015-04-10: qty 1

## 2015-04-10 NOTE — ED Notes (Signed)
Pt got something in his left eye Friday and tried to wash it out but is still watering and blurry from watering and hurting. States it itches too. Had it covered but took the patch off cause the cotton was getting in it.

## 2015-04-10 NOTE — Discharge Instructions (Signed)
Use polytrim as directed until symptoms improve. Refer to attached documents for more information. Return to the ED with worsening or concerning symptoms.

## 2015-04-10 NOTE — ED Provider Notes (Signed)
CSN: [id number]     Arrival date & time 04/10/15  1512 History  This chart was scribed for non-physician practitioner, Alvina Chou, working with Francine Graven, DO by Molli Posey, ED Scribe. This patient was seen in room TR04C/TR04C and the patient's care was started at 3:49 PM.   Chief Complaint  Patient presents with  . Eye Pain    The history is provided by the patient. No language interpreter was used.   HPI Comments: Khilyn Vaughns is a 49 y.o. male with a history of HTN, CKD, systolic CHF and ESRD who presents to the Emergency Department complaining of eye pain for the last 5 days. Pt states he got something in his left eye and tried to wash it out but states it is still watering and causing him pain. Pt states he was working outside when he thinks he got a foreign body in his left eye. He reports that the eye is itching as well. Pt states that he had covered it with a patch but took it off cause the cotton was getting in his eye. He reports no exacerbating or alleviating factors at this time.   Past Medical History  Diagnosis Date  . Hypertension   . CKD (chronic kidney disease) stage 4, GFR 15-29 ml/min   . Systolic CHF     Ef [id number]  . Medical non-compliance   . Polysubstance abuse   . ESRD (end stage renal disease)    Past Surgical History  Procedure Laterality Date  . Insertion of dialysis catheter  07/17/2012    Procedure: INSERTION OF DIALYSIS CATHETER;  Surgeon: Conrad Crystal, MD;  Location: [place];  Service: Vascular;  Laterality: Right;  right internal jugular  . Bascilic vein transposition  01/19/2013    Procedure: BASCILIC VEIN TRANSPOSITION;  Surgeon: Conrad Prosser, MD;  Location: [place];  Service: Vascular;  Laterality: Left;  left 2nd stage basilic vein transposition  . Venogram N/A 08/09/2012    Procedure: VENOGRAM;  Surgeon: Conrad [place], MD;  Location: [facility] CATH LAB;  Service: Cardiovascular;  Laterality: N/A;   Family History  Problem Relation Age of Onset  .  Hypertension Mother   . Diabetes Mother   . Hypertension Father    History  Substance Use Topics  . Smoking status: Current Every Day Smoker -- 0.25 packs/day    Types: Cigarettes    Last Attempt to Quit: 08/09/2012  . Smokeless tobacco: Not on file     Comment: Trying to quit; 3 cigs/last 2 weeks  . Alcohol Use: 4.2 oz/week    7 Cans of beer per week     Comment: Patient not forthcoming.  2-3 beers per week    Review of Systems  Eyes: Positive for pain, redness and itching.  All other systems reviewed and are negative.     Allergies  Review of patient's allergies indicates no known allergies.  Home Medications   Prior to Admission medications   Medication Sig Start Date End Date Taking? Authorizing Provider  albuterol (PROVENTIL HFA;VENTOLIN HFA) 108 (90 BASE) MCG/ACT inhaler Inhale 1-2 puffs into the lungs every 6 (six) hours as needed for wheezing or shortness of breath. 09/23/14   Hyman Bible, PA-C  calcium acetate (PHOSLO) 667 MG capsule Take 3 capsules (2,001 mg total) by mouth 3 (three) times daily with meals. 04/21/14   Maryann Mikhail, DO  carvedilol (COREG) 12.5 MG tablet Take 2 tablets (25 mg total) by mouth 2 (two) times daily with a meal.  03/09/14 10/25/15  Oswald Hillock, MD  isosorbide mononitrate (IMDUR) 60 MG 24 hr tablet Take 60 mg by mouth daily. 07/21/12   Orson Eva, MD  lisinopril (PRINIVIL,ZESTRIL) 40 MG tablet Take 40 mg by mouth at bedtime. 01/29/15   Historical Provider, MD  Multiple Vitamin (MULTIVITAMIN WITH MINERALS) TABS tablet Take 1 tablet by mouth daily as needed (Nutritional supplementation).     Historical Provider, MD  predniSONE (DELTASONE) 20 MG tablet Take 3 tablets (60 mg total) by mouth daily. 09/23/14   Heather Laisure, PA-C   BP 156/116 mmHg  Pulse 95  Temp(Src) 98.4 F (36.9 C) (Oral)  Resp 16  SpO2 99% Physical Exam  Constitutional: He is oriented to person, place, and time. He appears well-developed and well-nourished.  HENT:   Head: Normocephalic and atraumatic.  Eyes: Conjunctivae and EOM are normal.  Conjunctival injection on the left. Corneal abrasion noted just lateral of the iris on the left eye.   Neck: Normal range of motion. Neck supple. No tracheal deviation present.  Cardiovascular: Normal rate.   Pulmonary/Chest: Effort normal.  Abdominal: Soft. He exhibits no distension. There is tenderness.  Musculoskeletal: Normal range of motion.  Neurological: He is alert and oriented to person, place, and time. Coordination normal.  Skin: Skin is warm and dry.  Psychiatric: He has a normal mood and affect. His behavior is normal.  Nursing note and vitals reviewed.   ED Course  Procedures   DIAGNOSTIC STUDIES: Oxygen Saturation is 99% on RA, normal by my interpretation.    COORDINATION OF CARE: 4:04 PM Discussed treatment plan with pt at bedside and pt agreed to plan.   Labs Review Labs Reviewed - No data to display  Imaging Review No results found.   EKG Interpretation None      MDM   Final diagnoses:  Corneal abrasion, left, initial encounter   Patient will have antibiotic eyedrops for corneal abrasion.    I personally performed the services described in this documentation, which was scribed in my presence. The recorded information has been reviewed and is accurate.     Alvina Chou, PA-C 04/12/15 [place], DO 04/13/15 2110

## 2015-04-14 DIAGNOSIS — I120 Hypertensive chronic kidney disease with stage 5 chronic kidney disease or end stage renal disease: Secondary | ICD-10-CM | POA: Diagnosis not present

## 2015-04-14 DIAGNOSIS — N186 End stage renal disease: Secondary | ICD-10-CM | POA: Diagnosis not present

## 2015-04-14 DIAGNOSIS — Z992 Dependence on renal dialysis: Secondary | ICD-10-CM | POA: Diagnosis not present

## 2015-04-16 DIAGNOSIS — N2581 Secondary hyperparathyroidism of renal origin: Secondary | ICD-10-CM | POA: Diagnosis not present

## 2015-04-16 DIAGNOSIS — N186 End stage renal disease: Secondary | ICD-10-CM | POA: Diagnosis not present

## 2015-04-16 DIAGNOSIS — D509 Iron deficiency anemia, unspecified: Secondary | ICD-10-CM | POA: Diagnosis not present

## 2015-04-16 DIAGNOSIS — D631 Anemia in chronic kidney disease: Secondary | ICD-10-CM | POA: Diagnosis not present

## 2015-05-15 DIAGNOSIS — Z992 Dependence on renal dialysis: Secondary | ICD-10-CM | POA: Diagnosis not present

## 2015-05-15 DIAGNOSIS — N186 End stage renal disease: Secondary | ICD-10-CM | POA: Diagnosis not present

## 2015-05-15 DIAGNOSIS — I120 Hypertensive chronic kidney disease with stage 5 chronic kidney disease or end stage renal disease: Secondary | ICD-10-CM | POA: Diagnosis not present

## 2015-05-16 DIAGNOSIS — D509 Iron deficiency anemia, unspecified: Secondary | ICD-10-CM | POA: Diagnosis not present

## 2015-05-16 DIAGNOSIS — N186 End stage renal disease: Secondary | ICD-10-CM | POA: Diagnosis not present

## 2015-05-16 DIAGNOSIS — N2581 Secondary hyperparathyroidism of renal origin: Secondary | ICD-10-CM | POA: Diagnosis not present

## 2015-05-18 DIAGNOSIS — B0052 Herpesviral keratitis: Secondary | ICD-10-CM | POA: Diagnosis not present

## 2015-06-14 DIAGNOSIS — I120 Hypertensive chronic kidney disease with stage 5 chronic kidney disease or end stage renal disease: Secondary | ICD-10-CM | POA: Diagnosis not present

## 2015-06-14 DIAGNOSIS — N186 End stage renal disease: Secondary | ICD-10-CM | POA: Diagnosis not present

## 2015-06-14 DIAGNOSIS — Z992 Dependence on renal dialysis: Secondary | ICD-10-CM | POA: Diagnosis not present

## 2015-06-15 DIAGNOSIS — N186 End stage renal disease: Secondary | ICD-10-CM | POA: Diagnosis not present

## 2015-06-15 DIAGNOSIS — N2581 Secondary hyperparathyroidism of renal origin: Secondary | ICD-10-CM | POA: Diagnosis not present

## 2015-06-15 DIAGNOSIS — D509 Iron deficiency anemia, unspecified: Secondary | ICD-10-CM | POA: Diagnosis not present

## 2015-07-15 DIAGNOSIS — N186 End stage renal disease: Secondary | ICD-10-CM | POA: Diagnosis not present

## 2015-07-15 DIAGNOSIS — I120 Hypertensive chronic kidney disease with stage 5 chronic kidney disease or end stage renal disease: Secondary | ICD-10-CM | POA: Diagnosis not present

## 2015-07-15 DIAGNOSIS — Z992 Dependence on renal dialysis: Secondary | ICD-10-CM | POA: Diagnosis not present

## 2015-07-16 DIAGNOSIS — D509 Iron deficiency anemia, unspecified: Secondary | ICD-10-CM | POA: Diagnosis not present

## 2015-07-16 DIAGNOSIS — N186 End stage renal disease: Secondary | ICD-10-CM | POA: Diagnosis not present

## 2015-07-16 DIAGNOSIS — N2581 Secondary hyperparathyroidism of renal origin: Secondary | ICD-10-CM | POA: Diagnosis not present

## 2015-08-15 DIAGNOSIS — N186 End stage renal disease: Secondary | ICD-10-CM | POA: Diagnosis not present

## 2015-08-15 DIAGNOSIS — Z992 Dependence on renal dialysis: Secondary | ICD-10-CM | POA: Diagnosis not present

## 2015-08-15 DIAGNOSIS — I120 Hypertensive chronic kidney disease with stage 5 chronic kidney disease or end stage renal disease: Secondary | ICD-10-CM | POA: Diagnosis not present

## 2015-08-17 DIAGNOSIS — N2581 Secondary hyperparathyroidism of renal origin: Secondary | ICD-10-CM | POA: Diagnosis not present

## 2015-08-17 DIAGNOSIS — D509 Iron deficiency anemia, unspecified: Secondary | ICD-10-CM | POA: Diagnosis not present

## 2015-08-17 DIAGNOSIS — N186 End stage renal disease: Secondary | ICD-10-CM | POA: Diagnosis not present

## 2015-09-14 DIAGNOSIS — I120 Hypertensive chronic kidney disease with stage 5 chronic kidney disease or end stage renal disease: Secondary | ICD-10-CM | POA: Diagnosis not present

## 2015-09-14 DIAGNOSIS — N186 End stage renal disease: Secondary | ICD-10-CM | POA: Diagnosis not present

## 2015-09-14 DIAGNOSIS — Z992 Dependence on renal dialysis: Secondary | ICD-10-CM | POA: Diagnosis not present

## 2015-09-17 DIAGNOSIS — N186 End stage renal disease: Secondary | ICD-10-CM | POA: Diagnosis not present

## 2015-09-17 DIAGNOSIS — N2581 Secondary hyperparathyroidism of renal origin: Secondary | ICD-10-CM | POA: Diagnosis not present

## 2015-09-17 DIAGNOSIS — D509 Iron deficiency anemia, unspecified: Secondary | ICD-10-CM | POA: Diagnosis not present

## 2015-09-17 DIAGNOSIS — Z23 Encounter for immunization: Secondary | ICD-10-CM | POA: Diagnosis not present

## 2015-09-19 DIAGNOSIS — Z23 Encounter for immunization: Secondary | ICD-10-CM | POA: Diagnosis not present

## 2015-09-19 DIAGNOSIS — N186 End stage renal disease: Secondary | ICD-10-CM | POA: Diagnosis not present

## 2015-09-19 DIAGNOSIS — D509 Iron deficiency anemia, unspecified: Secondary | ICD-10-CM | POA: Diagnosis not present

## 2015-09-19 DIAGNOSIS — N2581 Secondary hyperparathyroidism of renal origin: Secondary | ICD-10-CM | POA: Diagnosis not present

## 2015-09-21 DIAGNOSIS — Z23 Encounter for immunization: Secondary | ICD-10-CM | POA: Diagnosis not present

## 2015-09-21 DIAGNOSIS — N2581 Secondary hyperparathyroidism of renal origin: Secondary | ICD-10-CM | POA: Diagnosis not present

## 2015-09-21 DIAGNOSIS — N186 End stage renal disease: Secondary | ICD-10-CM | POA: Diagnosis not present

## 2015-09-21 DIAGNOSIS — D509 Iron deficiency anemia, unspecified: Secondary | ICD-10-CM | POA: Diagnosis not present

## 2015-09-24 DIAGNOSIS — D509 Iron deficiency anemia, unspecified: Secondary | ICD-10-CM | POA: Diagnosis not present

## 2015-09-24 DIAGNOSIS — N2581 Secondary hyperparathyroidism of renal origin: Secondary | ICD-10-CM | POA: Diagnosis not present

## 2015-09-24 DIAGNOSIS — Z23 Encounter for immunization: Secondary | ICD-10-CM | POA: Diagnosis not present

## 2015-09-24 DIAGNOSIS — N186 End stage renal disease: Secondary | ICD-10-CM | POA: Diagnosis not present

## 2015-09-26 DIAGNOSIS — N2581 Secondary hyperparathyroidism of renal origin: Secondary | ICD-10-CM | POA: Diagnosis not present

## 2015-09-26 DIAGNOSIS — D509 Iron deficiency anemia, unspecified: Secondary | ICD-10-CM | POA: Diagnosis not present

## 2015-09-26 DIAGNOSIS — Z23 Encounter for immunization: Secondary | ICD-10-CM | POA: Diagnosis not present

## 2015-09-26 DIAGNOSIS — N186 End stage renal disease: Secondary | ICD-10-CM | POA: Diagnosis not present

## 2015-09-28 DIAGNOSIS — Z23 Encounter for immunization: Secondary | ICD-10-CM | POA: Diagnosis not present

## 2015-09-28 DIAGNOSIS — N2581 Secondary hyperparathyroidism of renal origin: Secondary | ICD-10-CM | POA: Diagnosis not present

## 2015-09-28 DIAGNOSIS — N186 End stage renal disease: Secondary | ICD-10-CM | POA: Diagnosis not present

## 2015-09-28 DIAGNOSIS — D509 Iron deficiency anemia, unspecified: Secondary | ICD-10-CM | POA: Diagnosis not present

## 2015-10-01 DIAGNOSIS — Z23 Encounter for immunization: Secondary | ICD-10-CM | POA: Diagnosis not present

## 2015-10-01 DIAGNOSIS — N186 End stage renal disease: Secondary | ICD-10-CM | POA: Diagnosis not present

## 2015-10-01 DIAGNOSIS — N2581 Secondary hyperparathyroidism of renal origin: Secondary | ICD-10-CM | POA: Diagnosis not present

## 2015-10-01 DIAGNOSIS — D509 Iron deficiency anemia, unspecified: Secondary | ICD-10-CM | POA: Diagnosis not present

## 2015-10-03 DIAGNOSIS — D509 Iron deficiency anemia, unspecified: Secondary | ICD-10-CM | POA: Diagnosis not present

## 2015-10-03 DIAGNOSIS — N2581 Secondary hyperparathyroidism of renal origin: Secondary | ICD-10-CM | POA: Diagnosis not present

## 2015-10-03 DIAGNOSIS — Z23 Encounter for immunization: Secondary | ICD-10-CM | POA: Diagnosis not present

## 2015-10-03 DIAGNOSIS — N186 End stage renal disease: Secondary | ICD-10-CM | POA: Diagnosis not present

## 2015-10-05 DIAGNOSIS — N186 End stage renal disease: Secondary | ICD-10-CM | POA: Diagnosis not present

## 2015-10-05 DIAGNOSIS — D509 Iron deficiency anemia, unspecified: Secondary | ICD-10-CM | POA: Diagnosis not present

## 2015-10-05 DIAGNOSIS — N2581 Secondary hyperparathyroidism of renal origin: Secondary | ICD-10-CM | POA: Diagnosis not present

## 2015-10-05 DIAGNOSIS — Z23 Encounter for immunization: Secondary | ICD-10-CM | POA: Diagnosis not present

## 2015-10-08 DIAGNOSIS — N2581 Secondary hyperparathyroidism of renal origin: Secondary | ICD-10-CM | POA: Diagnosis not present

## 2015-10-08 DIAGNOSIS — D509 Iron deficiency anemia, unspecified: Secondary | ICD-10-CM | POA: Diagnosis not present

## 2015-10-08 DIAGNOSIS — N186 End stage renal disease: Secondary | ICD-10-CM | POA: Diagnosis not present

## 2015-10-08 DIAGNOSIS — Z23 Encounter for immunization: Secondary | ICD-10-CM | POA: Diagnosis not present

## 2015-10-09 ENCOUNTER — Ambulatory Visit (INDEPENDENT_AMBULATORY_CARE_PROVIDER_SITE_OTHER): Payer: Medicare Other | Admitting: Internal Medicine

## 2015-10-09 ENCOUNTER — Ambulatory Visit (INDEPENDENT_AMBULATORY_CARE_PROVIDER_SITE_OTHER)
Admission: RE | Admit: 2015-10-09 | Discharge: 2015-10-09 | Disposition: A | Discharge status: Home / Self Care | Payer: Medicare Other | Source: Ambulatory Visit | Attending: Internal Medicine | Admitting: Internal Medicine

## 2015-10-09 ENCOUNTER — Encounter: Payer: Self-pay | Admitting: Internal Medicine

## 2015-10-09 VITALS — BP 120/74 | HR 62 | Ht 69.5 in | Wt 175.4 lb

## 2015-10-09 DIAGNOSIS — F17210 Nicotine dependence, cigarettes, uncomplicated: Secondary | ICD-10-CM

## 2015-10-09 DIAGNOSIS — R058 Other specified cough: Secondary | ICD-10-CM

## 2015-10-09 DIAGNOSIS — R05 Cough: Secondary | ICD-10-CM

## 2015-10-09 DIAGNOSIS — R911 Solitary pulmonary nodule: Secondary | ICD-10-CM | POA: Diagnosis not present

## 2015-10-09 DIAGNOSIS — Z720 Tobacco use: Secondary | ICD-10-CM | POA: Diagnosis not present

## 2015-10-09 HISTORY — DX: Other specified cough: R05.8

## 2015-10-09 MED ORDER — PREDNISONE 10 MG PO TABS: ORAL_TABLET | ORAL | Status: DC

## 2015-10-09 MED ORDER — PANTOPRAZOLE SODIUM 40 MG PO TBEC: 40.0000 mg | DELAYED_RELEASE_TABLET | Freq: Every day | ORAL | Status: DC

## 2015-10-09 MED ORDER — FAMOTIDINE 20 MG PO TABS: ORAL_TABLET | ORAL | Status: DC

## 2015-10-09 NOTE — Progress Notes (Signed)
Subjective:    Patient ID: [name] [name], male    DOB: June 21, 1966,    MRN: [id number]  HPI  45 yobm active smoker no prev h/o asthma/ allergies with esrf on HD x 2015 due to hbp and referred by Dr Joelyn Oms 10/09/2015 for cough x 07/2015   10/09/2015 1st [facility] office visit/ [facility]   Chief Complaint  Patient presents with  . Advice Only    Referred by Dialysis doctor; chronic cough x 2 months, only thing that helps is eating ice or keeping something in mouth, wheezing..   cough is dry, hacking, daily, came on 2 m prior to OV  While on acei no better since stopped ?2-3 weeks prior to OV (on first bottle of cozar)   Not present on wakening, worse as day goes on  With voice use / better when sucks on ice cubes but can't do this too much due to excess water intake on HD - tried inhaler = ventolin no better but note on moderate doses of coreg  No obvious other patterns in day to day or daytime variabilty or assoc chronic cough or cp or chest tightness, subjective wheeze overt sinus or hb symptoms. No unusual exp hx or h/o childhood pna/ asthma or knowledge of premature birth.  Sleeping ok without nocturnal  or early am exacerbation  of respiratory  c/o's or need for noct saba. Also denies any obvious fluctuation of symptoms with weather or environmental changes or other aggravating or alleviating factors except as outlined above   Current Medications, Allergies, Complete Past Medical History, Past Surgical History, Family History, and Social History were reviewed in [employer] record.             Review of Systems  Constitutional: Negative for fever, chills, activity change, appetite change and unexpected weight change.  HENT: Negative for congestion, dental problem, postnasal drip, rhinorrhea, sneezing, sore throat, trouble swallowing and voice change.   Eyes: Negative for visual disturbance.  Respiratory: Positive for cough. Negative for choking and  shortness of breath.   Cardiovascular: Negative for chest pain and leg swelling.  Gastrointestinal: Negative for nausea, vomiting and abdominal pain.  Genitourinary: Negative for difficulty urinating.  Musculoskeletal: Negative for arthralgias.  Skin: Negative for rash.  Psychiatric/Behavioral: Negative for behavioral problems and confusion.       Objective:   Physical Exam  amb bm nad  Wt Readings from Last 3 Encounters:  10/09/15 175 lb 6.4 oz (79.561 kg)  02/26/15 164 lb 5 oz (74.532 kg)  09/23/14 160 lb (72.576 kg)    Vital signs reviewed   HEENT: nl dentition, turbinates, and orophanx. Nl external ear canals without cough reflex   NECK :  without JVD/Nodes/TM/ nl carotid upstrokes bilaterally   LUNGS: no acc muscle use, clear to A and P bilaterally without cough on insp or exp maneuvers   CV:  RRR  no s3 or murmur or increase in P2, no edema   ABD:  soft and nontender with nl excursion in the supine position. No bruits or organomegaly, bowel sounds nl  MS:  warm without deformities, calf tenderness, cyanosis or clubbing  SKIN: warm and dry without lesions    NEURO:  alert, approp, no deficits     .   CXR PA and Lateral:   10/09/2015 :    I personally reviewed images and agree with radiology impression as follows:   No acute infiltrate or pulmonary edema. Mild perihilar bronchitic changes. There is  vague nodular density left midlung laterally overlying left eighth rib posteriorly. A lung nodule cannot be excluded. Further correlation with CT scan of the chest is recommended.       Assessment & Plan:

## 2015-10-09 NOTE — Patient Instructions (Addendum)
Prednisone 10 mg take  4 each am x 2 days,   2 each am x 2 days,  1 each am x 2 days and stop  Pantoprazole (protonix) 40 mg   Take  30-60 min before first meal of the day and Pepcid (famotidine)  20 mg one @  bedtime until return to office - this is the best way to tell whether stomach acid is contributing to your problem.    GERD (REFLUX)  is an extremely common cause of respiratory symptoms just like yours , many times with no obvious heartburn at all.    It can be treated with medication, but also with lifestyle changes including elevation of the head of your bed (ideally with 6 inch  bed blocks),  Smoking cessation, avoidance of late meals, excessive alcohol, and avoid fatty foods, chocolate, peppermint, colas, red wine, and acidic juices such as orange juice.  NO MINT OR MENTHOL PRODUCTS SO NO COUGH DROPS  USE SUGARLESS CANDY INSTEAD (Jolley ranchers or Stover's or Life Savers) or even ice chips will also do - the key is to swallow to prevent all throat clearing. NO OIL BASED VITAMINS - use powdered substitutes.  Please remember to go to the  x-ray  department downstairs for your tests - we will call you with the results when they are available.  In the short run, ok take gabapentin 300 three times daily until stop coughing then resume night time dose only       Please schedule a follow up office visit in 4 weeks, sooner if needed

## 2015-10-10 DIAGNOSIS — D509 Iron deficiency anemia, unspecified: Secondary | ICD-10-CM | POA: Diagnosis not present

## 2015-10-10 DIAGNOSIS — R911 Solitary pulmonary nodule: Secondary | ICD-10-CM

## 2015-10-10 DIAGNOSIS — Z23 Encounter for immunization: Secondary | ICD-10-CM | POA: Diagnosis not present

## 2015-10-10 DIAGNOSIS — N186 End stage renal disease: Secondary | ICD-10-CM | POA: Diagnosis not present

## 2015-10-10 DIAGNOSIS — N2581 Secondary hyperparathyroidism of renal origin: Secondary | ICD-10-CM | POA: Diagnosis not present

## 2015-10-10 HISTORY — DX: Solitary pulmonary nodule: R91.1

## 2015-10-10 NOTE — Assessment & Plan Note (Signed)
The chest x-rays were reviewed and to me this looks like a rib fracture but since he is an active smoker less he remembers a recent rib injury it is reasonable to go ahead his radiology recommends with a noncontrast CT scan. Since she is a chronic cough or will also do a sinus scan limited.

## 2015-10-10 NOTE — Assessment & Plan Note (Signed)

## 2015-10-10 NOTE — Assessment & Plan Note (Addendum)
The most common causes of chronic cough in immunocompetent adults include the following: upper airway cough syndrome (UACS), previously referred to as postnasal drip syndrome (PNDS), which is caused by variety of rhinosinus conditions; (2) asthma; (3) GERD; (4) chronic bronchitis from cigarette smoking or other inhaled environmental irritants; (5) nonasthmatic eosinophilic bronchitis; and (6) bronchiectasis.   These conditions, singly or in combination, have accounted for up to 94% of the causes of chronic cough in prospective studies.   Other conditions have constituted no >6% of the causes in prospective studies These have included bronchogenic carcinoma, chronic interstitial pneumonia, sarcoidosis, left ventricular failure, ACEI-induced cough, and aspiration from a condition associated with pharyngeal dysfunction.    Chronic cough is often simultaneously caused by more than one condition. A single cause has been found from 38 to 82% of the time, multiple causes from 18 to 62%. Multiply caused cough has been the result of three diseases up to 42% of the time.       Although he is a smoker this cough is not typical of chronic bronchitis as he does not have excess mucus in the morning or any evidence of COPD clinically that I can detect. Is much more likely this is upper airway cough syndrome related to the use of ace inhibitors and it simply not been long enough for the full effects of ace inhibitors to have worn off based on the fact that he still has plenty of Cozaar pills in his first bottle he did make the actual change off of ace inhibitors until 2 or 3 weeks ago.  The act that melting ice helps the cough is strongly also in favor of upper airway cough syndrome and the best substitute for ice is  hard rock candy which will not add to his fluid burden and keep him from clearing his throat which promote GERD and cyclical cough. Also treat occult reflux with diet and acid suppression. We can treat  upper airways inflammation with very short course of prednisone as well.  If not better in 4 weeks needs to return to start the chronic cough algorithm.  Total time devoted to counseling  = 35/48m review case with pt/ discussion of options/alternatives/ giving and going over instructions (see avs)

## 2015-10-11 NOTE — Progress Notes (Signed)
Quick Note:  LMTCB ______ 

## 2015-10-12 DIAGNOSIS — N186 End stage renal disease: Secondary | ICD-10-CM | POA: Diagnosis not present

## 2015-10-12 DIAGNOSIS — D509 Iron deficiency anemia, unspecified: Secondary | ICD-10-CM | POA: Diagnosis not present

## 2015-10-12 DIAGNOSIS — Z23 Encounter for immunization: Secondary | ICD-10-CM | POA: Diagnosis not present

## 2015-10-12 DIAGNOSIS — N2581 Secondary hyperparathyroidism of renal origin: Secondary | ICD-10-CM | POA: Diagnosis not present

## 2015-10-15 DIAGNOSIS — D509 Iron deficiency anemia, unspecified: Secondary | ICD-10-CM | POA: Diagnosis not present

## 2015-10-15 DIAGNOSIS — Z23 Encounter for immunization: Secondary | ICD-10-CM | POA: Diagnosis not present

## 2015-10-15 DIAGNOSIS — N2581 Secondary hyperparathyroidism of renal origin: Secondary | ICD-10-CM | POA: Diagnosis not present

## 2015-10-15 DIAGNOSIS — Z992 Dependence on renal dialysis: Secondary | ICD-10-CM | POA: Diagnosis not present

## 2015-10-15 DIAGNOSIS — I120 Hypertensive chronic kidney disease with stage 5 chronic kidney disease or end stage renal disease: Secondary | ICD-10-CM | POA: Diagnosis not present

## 2015-10-15 DIAGNOSIS — N186 End stage renal disease: Secondary | ICD-10-CM | POA: Diagnosis not present

## 2015-10-16 NOTE — Progress Notes (Signed)
Quick Note:  LMTCB ______ 

## 2015-10-17 ENCOUNTER — Encounter: Payer: Self-pay | Admitting: *Deleted

## 2015-10-17 NOTE — Progress Notes (Signed)
Quick Note:    Letter mailed.  ______

## 2015-10-19 DIAGNOSIS — N186 End stage renal disease: Secondary | ICD-10-CM | POA: Diagnosis not present

## 2015-10-19 DIAGNOSIS — N2581 Secondary hyperparathyroidism of renal origin: Secondary | ICD-10-CM | POA: Diagnosis not present

## 2015-10-19 DIAGNOSIS — E8770 Fluid overload, unspecified: Secondary | ICD-10-CM | POA: Diagnosis not present

## 2015-10-19 DIAGNOSIS — D509 Iron deficiency anemia, unspecified: Secondary | ICD-10-CM | POA: Diagnosis not present

## 2015-10-22 DIAGNOSIS — E8770 Fluid overload, unspecified: Secondary | ICD-10-CM | POA: Diagnosis not present

## 2015-10-22 DIAGNOSIS — D509 Iron deficiency anemia, unspecified: Secondary | ICD-10-CM | POA: Diagnosis not present

## 2015-10-22 DIAGNOSIS — N2581 Secondary hyperparathyroidism of renal origin: Secondary | ICD-10-CM | POA: Diagnosis not present

## 2015-10-22 DIAGNOSIS — N186 End stage renal disease: Secondary | ICD-10-CM | POA: Diagnosis not present

## 2015-10-26 DIAGNOSIS — D509 Iron deficiency anemia, unspecified: Secondary | ICD-10-CM | POA: Diagnosis not present

## 2015-10-26 DIAGNOSIS — N2581 Secondary hyperparathyroidism of renal origin: Secondary | ICD-10-CM | POA: Diagnosis not present

## 2015-10-26 DIAGNOSIS — N186 End stage renal disease: Secondary | ICD-10-CM | POA: Diagnosis not present

## 2015-10-26 DIAGNOSIS — E8770 Fluid overload, unspecified: Secondary | ICD-10-CM | POA: Diagnosis not present

## 2015-10-31 DIAGNOSIS — N2581 Secondary hyperparathyroidism of renal origin: Secondary | ICD-10-CM | POA: Diagnosis not present

## 2015-10-31 DIAGNOSIS — E8770 Fluid overload, unspecified: Secondary | ICD-10-CM | POA: Diagnosis not present

## 2015-10-31 DIAGNOSIS — N186 End stage renal disease: Secondary | ICD-10-CM | POA: Diagnosis not present

## 2015-10-31 DIAGNOSIS — D509 Iron deficiency anemia, unspecified: Secondary | ICD-10-CM | POA: Diagnosis not present

## 2015-11-01 DIAGNOSIS — N2581 Secondary hyperparathyroidism of renal origin: Secondary | ICD-10-CM | POA: Diagnosis not present

## 2015-11-01 DIAGNOSIS — N186 End stage renal disease: Secondary | ICD-10-CM | POA: Diagnosis not present

## 2015-11-01 DIAGNOSIS — E8770 Fluid overload, unspecified: Secondary | ICD-10-CM | POA: Diagnosis not present

## 2015-11-01 DIAGNOSIS — D509 Iron deficiency anemia, unspecified: Secondary | ICD-10-CM | POA: Diagnosis not present

## 2015-11-05 DIAGNOSIS — E8770 Fluid overload, unspecified: Secondary | ICD-10-CM | POA: Diagnosis not present

## 2015-11-05 DIAGNOSIS — N2581 Secondary hyperparathyroidism of renal origin: Secondary | ICD-10-CM | POA: Diagnosis not present

## 2015-11-05 DIAGNOSIS — D509 Iron deficiency anemia, unspecified: Secondary | ICD-10-CM | POA: Diagnosis not present

## 2015-11-05 DIAGNOSIS — N186 End stage renal disease: Secondary | ICD-10-CM | POA: Diagnosis not present

## 2015-11-06 ENCOUNTER — Ambulatory Visit: Payer: Medicare Other | Admitting: Internal Medicine

## 2015-11-12 DIAGNOSIS — E8770 Fluid overload, unspecified: Secondary | ICD-10-CM | POA: Diagnosis not present

## 2015-11-12 DIAGNOSIS — N2581 Secondary hyperparathyroidism of renal origin: Secondary | ICD-10-CM | POA: Diagnosis not present

## 2015-11-12 DIAGNOSIS — D509 Iron deficiency anemia, unspecified: Secondary | ICD-10-CM | POA: Diagnosis not present

## 2015-11-12 DIAGNOSIS — N186 End stage renal disease: Secondary | ICD-10-CM | POA: Diagnosis not present

## 2015-11-14 DIAGNOSIS — N186 End stage renal disease: Secondary | ICD-10-CM | POA: Diagnosis not present

## 2015-11-14 DIAGNOSIS — E8770 Fluid overload, unspecified: Secondary | ICD-10-CM | POA: Diagnosis not present

## 2015-11-14 DIAGNOSIS — I120 Hypertensive chronic kidney disease with stage 5 chronic kidney disease or end stage renal disease: Secondary | ICD-10-CM | POA: Diagnosis not present

## 2015-11-14 DIAGNOSIS — Z992 Dependence on renal dialysis: Secondary | ICD-10-CM | POA: Diagnosis not present

## 2015-11-14 DIAGNOSIS — D509 Iron deficiency anemia, unspecified: Secondary | ICD-10-CM | POA: Diagnosis not present

## 2015-11-14 DIAGNOSIS — N2581 Secondary hyperparathyroidism of renal origin: Secondary | ICD-10-CM | POA: Diagnosis not present

## 2015-11-16 DIAGNOSIS — N186 End stage renal disease: Secondary | ICD-10-CM | POA: Diagnosis not present

## 2015-11-16 DIAGNOSIS — D509 Iron deficiency anemia, unspecified: Secondary | ICD-10-CM | POA: Diagnosis not present

## 2015-11-16 DIAGNOSIS — N2581 Secondary hyperparathyroidism of renal origin: Secondary | ICD-10-CM | POA: Diagnosis not present

## 2015-11-19 DIAGNOSIS — D509 Iron deficiency anemia, unspecified: Secondary | ICD-10-CM | POA: Diagnosis not present

## 2015-11-19 DIAGNOSIS — N186 End stage renal disease: Secondary | ICD-10-CM | POA: Diagnosis not present

## 2015-11-19 DIAGNOSIS — N2581 Secondary hyperparathyroidism of renal origin: Secondary | ICD-10-CM | POA: Diagnosis not present

## 2015-11-21 DIAGNOSIS — N2581 Secondary hyperparathyroidism of renal origin: Secondary | ICD-10-CM | POA: Diagnosis not present

## 2015-11-21 DIAGNOSIS — D509 Iron deficiency anemia, unspecified: Secondary | ICD-10-CM | POA: Diagnosis not present

## 2015-11-21 DIAGNOSIS — N186 End stage renal disease: Secondary | ICD-10-CM | POA: Diagnosis not present

## 2015-11-23 DIAGNOSIS — N2581 Secondary hyperparathyroidism of renal origin: Secondary | ICD-10-CM | POA: Diagnosis not present

## 2015-11-23 DIAGNOSIS — N186 End stage renal disease: Secondary | ICD-10-CM | POA: Diagnosis not present

## 2015-11-23 DIAGNOSIS — D509 Iron deficiency anemia, unspecified: Secondary | ICD-10-CM | POA: Diagnosis not present

## 2015-11-26 DIAGNOSIS — D509 Iron deficiency anemia, unspecified: Secondary | ICD-10-CM | POA: Diagnosis not present

## 2015-11-26 DIAGNOSIS — N186 End stage renal disease: Secondary | ICD-10-CM | POA: Diagnosis not present

## 2015-11-26 DIAGNOSIS — N2581 Secondary hyperparathyroidism of renal origin: Secondary | ICD-10-CM | POA: Diagnosis not present

## 2015-11-28 DIAGNOSIS — N186 End stage renal disease: Secondary | ICD-10-CM | POA: Diagnosis not present

## 2015-11-28 DIAGNOSIS — N2581 Secondary hyperparathyroidism of renal origin: Secondary | ICD-10-CM | POA: Diagnosis not present

## 2015-11-28 DIAGNOSIS — D509 Iron deficiency anemia, unspecified: Secondary | ICD-10-CM | POA: Diagnosis not present

## 2015-11-30 DIAGNOSIS — N186 End stage renal disease: Secondary | ICD-10-CM | POA: Diagnosis not present

## 2015-11-30 DIAGNOSIS — N2581 Secondary hyperparathyroidism of renal origin: Secondary | ICD-10-CM | POA: Diagnosis not present

## 2015-11-30 DIAGNOSIS — D509 Iron deficiency anemia, unspecified: Secondary | ICD-10-CM | POA: Diagnosis not present

## 2015-12-03 DIAGNOSIS — D509 Iron deficiency anemia, unspecified: Secondary | ICD-10-CM | POA: Diagnosis not present

## 2015-12-03 DIAGNOSIS — N2581 Secondary hyperparathyroidism of renal origin: Secondary | ICD-10-CM | POA: Diagnosis not present

## 2015-12-03 DIAGNOSIS — N186 End stage renal disease: Secondary | ICD-10-CM | POA: Diagnosis not present

## 2015-12-05 DIAGNOSIS — D509 Iron deficiency anemia, unspecified: Secondary | ICD-10-CM | POA: Diagnosis not present

## 2015-12-05 DIAGNOSIS — N186 End stage renal disease: Secondary | ICD-10-CM | POA: Diagnosis not present

## 2015-12-05 DIAGNOSIS — N2581 Secondary hyperparathyroidism of renal origin: Secondary | ICD-10-CM | POA: Diagnosis not present

## 2015-12-07 DIAGNOSIS — N186 End stage renal disease: Secondary | ICD-10-CM | POA: Diagnosis not present

## 2015-12-07 DIAGNOSIS — D509 Iron deficiency anemia, unspecified: Secondary | ICD-10-CM | POA: Diagnosis not present

## 2015-12-07 DIAGNOSIS — N2581 Secondary hyperparathyroidism of renal origin: Secondary | ICD-10-CM | POA: Diagnosis not present

## 2015-12-10 DIAGNOSIS — N186 End stage renal disease: Secondary | ICD-10-CM | POA: Diagnosis not present

## 2015-12-10 DIAGNOSIS — D509 Iron deficiency anemia, unspecified: Secondary | ICD-10-CM | POA: Diagnosis not present

## 2015-12-10 DIAGNOSIS — N2581 Secondary hyperparathyroidism of renal origin: Secondary | ICD-10-CM | POA: Diagnosis not present

## 2015-12-12 DIAGNOSIS — N186 End stage renal disease: Secondary | ICD-10-CM | POA: Diagnosis not present

## 2015-12-12 DIAGNOSIS — N2581 Secondary hyperparathyroidism of renal origin: Secondary | ICD-10-CM | POA: Diagnosis not present

## 2015-12-12 DIAGNOSIS — D509 Iron deficiency anemia, unspecified: Secondary | ICD-10-CM | POA: Diagnosis not present

## 2015-12-15 DIAGNOSIS — Z992 Dependence on renal dialysis: Secondary | ICD-10-CM | POA: Diagnosis not present

## 2015-12-15 DIAGNOSIS — I120 Hypertensive chronic kidney disease with stage 5 chronic kidney disease or end stage renal disease: Secondary | ICD-10-CM | POA: Diagnosis not present

## 2015-12-15 DIAGNOSIS — N186 End stage renal disease: Secondary | ICD-10-CM | POA: Diagnosis not present

## 2015-12-17 DIAGNOSIS — D509 Iron deficiency anemia, unspecified: Secondary | ICD-10-CM | POA: Diagnosis not present

## 2015-12-17 DIAGNOSIS — N186 End stage renal disease: Secondary | ICD-10-CM | POA: Diagnosis not present

## 2015-12-17 DIAGNOSIS — D631 Anemia in chronic kidney disease: Secondary | ICD-10-CM | POA: Diagnosis not present

## 2015-12-17 DIAGNOSIS — N2581 Secondary hyperparathyroidism of renal origin: Secondary | ICD-10-CM | POA: Diagnosis not present

## 2015-12-21 DIAGNOSIS — D509 Iron deficiency anemia, unspecified: Secondary | ICD-10-CM | POA: Diagnosis not present

## 2015-12-21 DIAGNOSIS — N2581 Secondary hyperparathyroidism of renal origin: Secondary | ICD-10-CM | POA: Diagnosis not present

## 2015-12-21 DIAGNOSIS — N186 End stage renal disease: Secondary | ICD-10-CM | POA: Diagnosis not present

## 2015-12-21 DIAGNOSIS — D631 Anemia in chronic kidney disease: Secondary | ICD-10-CM | POA: Diagnosis not present

## 2015-12-26 DIAGNOSIS — D631 Anemia in chronic kidney disease: Secondary | ICD-10-CM | POA: Diagnosis not present

## 2015-12-26 DIAGNOSIS — D509 Iron deficiency anemia, unspecified: Secondary | ICD-10-CM | POA: Diagnosis not present

## 2015-12-26 DIAGNOSIS — N2581 Secondary hyperparathyroidism of renal origin: Secondary | ICD-10-CM | POA: Diagnosis not present

## 2015-12-26 DIAGNOSIS — N186 End stage renal disease: Secondary | ICD-10-CM | POA: Diagnosis not present

## 2015-12-28 DIAGNOSIS — D509 Iron deficiency anemia, unspecified: Secondary | ICD-10-CM | POA: Diagnosis not present

## 2015-12-28 DIAGNOSIS — D631 Anemia in chronic kidney disease: Secondary | ICD-10-CM | POA: Diagnosis not present

## 2015-12-28 DIAGNOSIS — N2581 Secondary hyperparathyroidism of renal origin: Secondary | ICD-10-CM | POA: Diagnosis not present

## 2015-12-28 DIAGNOSIS — N186 End stage renal disease: Secondary | ICD-10-CM | POA: Diagnosis not present

## 2015-12-31 DIAGNOSIS — N2581 Secondary hyperparathyroidism of renal origin: Secondary | ICD-10-CM | POA: Diagnosis not present

## 2015-12-31 DIAGNOSIS — N186 End stage renal disease: Secondary | ICD-10-CM | POA: Diagnosis not present

## 2015-12-31 DIAGNOSIS — D509 Iron deficiency anemia, unspecified: Secondary | ICD-10-CM | POA: Diagnosis not present

## 2015-12-31 DIAGNOSIS — D631 Anemia in chronic kidney disease: Secondary | ICD-10-CM | POA: Diagnosis not present

## 2016-01-02 DIAGNOSIS — N2581 Secondary hyperparathyroidism of renal origin: Secondary | ICD-10-CM | POA: Diagnosis not present

## 2016-01-02 DIAGNOSIS — D509 Iron deficiency anemia, unspecified: Secondary | ICD-10-CM | POA: Diagnosis not present

## 2016-01-02 DIAGNOSIS — N186 End stage renal disease: Secondary | ICD-10-CM | POA: Diagnosis not present

## 2016-01-02 DIAGNOSIS — D631 Anemia in chronic kidney disease: Secondary | ICD-10-CM | POA: Diagnosis not present

## 2016-01-04 DIAGNOSIS — N186 End stage renal disease: Secondary | ICD-10-CM | POA: Diagnosis not present

## 2016-01-04 DIAGNOSIS — N2581 Secondary hyperparathyroidism of renal origin: Secondary | ICD-10-CM | POA: Diagnosis not present

## 2016-01-04 DIAGNOSIS — D631 Anemia in chronic kidney disease: Secondary | ICD-10-CM | POA: Diagnosis not present

## 2016-01-04 DIAGNOSIS — D509 Iron deficiency anemia, unspecified: Secondary | ICD-10-CM | POA: Diagnosis not present

## 2016-01-07 DIAGNOSIS — N2581 Secondary hyperparathyroidism of renal origin: Secondary | ICD-10-CM | POA: Diagnosis not present

## 2016-01-07 DIAGNOSIS — N186 End stage renal disease: Secondary | ICD-10-CM | POA: Diagnosis not present

## 2016-01-07 DIAGNOSIS — D509 Iron deficiency anemia, unspecified: Secondary | ICD-10-CM | POA: Diagnosis not present

## 2016-01-07 DIAGNOSIS — D631 Anemia in chronic kidney disease: Secondary | ICD-10-CM | POA: Diagnosis not present

## 2016-01-09 DIAGNOSIS — N2581 Secondary hyperparathyroidism of renal origin: Secondary | ICD-10-CM | POA: Diagnosis not present

## 2016-01-09 DIAGNOSIS — D509 Iron deficiency anemia, unspecified: Secondary | ICD-10-CM | POA: Diagnosis not present

## 2016-01-09 DIAGNOSIS — N186 End stage renal disease: Secondary | ICD-10-CM | POA: Diagnosis not present

## 2016-01-09 DIAGNOSIS — D631 Anemia in chronic kidney disease: Secondary | ICD-10-CM | POA: Diagnosis not present

## 2016-01-11 DIAGNOSIS — N2581 Secondary hyperparathyroidism of renal origin: Secondary | ICD-10-CM | POA: Diagnosis not present

## 2016-01-11 DIAGNOSIS — N186 End stage renal disease: Secondary | ICD-10-CM | POA: Diagnosis not present

## 2016-01-11 DIAGNOSIS — D631 Anemia in chronic kidney disease: Secondary | ICD-10-CM | POA: Diagnosis not present

## 2016-01-11 DIAGNOSIS — D509 Iron deficiency anemia, unspecified: Secondary | ICD-10-CM | POA: Diagnosis not present

## 2016-01-14 DIAGNOSIS — D509 Iron deficiency anemia, unspecified: Secondary | ICD-10-CM | POA: Diagnosis not present

## 2016-01-14 DIAGNOSIS — N2581 Secondary hyperparathyroidism of renal origin: Secondary | ICD-10-CM | POA: Diagnosis not present

## 2016-01-14 DIAGNOSIS — D631 Anemia in chronic kidney disease: Secondary | ICD-10-CM | POA: Diagnosis not present

## 2016-01-14 DIAGNOSIS — N186 End stage renal disease: Secondary | ICD-10-CM | POA: Diagnosis not present

## 2016-01-15 DIAGNOSIS — N186 End stage renal disease: Secondary | ICD-10-CM | POA: Diagnosis not present

## 2016-01-15 DIAGNOSIS — Z992 Dependence on renal dialysis: Secondary | ICD-10-CM | POA: Diagnosis not present

## 2016-01-15 DIAGNOSIS — I120 Hypertensive chronic kidney disease with stage 5 chronic kidney disease or end stage renal disease: Secondary | ICD-10-CM | POA: Diagnosis not present

## 2016-01-16 DIAGNOSIS — N186 End stage renal disease: Secondary | ICD-10-CM | POA: Diagnosis not present

## 2016-01-16 DIAGNOSIS — N2581 Secondary hyperparathyroidism of renal origin: Secondary | ICD-10-CM | POA: Diagnosis not present

## 2016-01-16 DIAGNOSIS — D509 Iron deficiency anemia, unspecified: Secondary | ICD-10-CM | POA: Diagnosis not present

## 2016-01-18 DIAGNOSIS — N186 End stage renal disease: Secondary | ICD-10-CM | POA: Diagnosis not present

## 2016-01-18 DIAGNOSIS — D509 Iron deficiency anemia, unspecified: Secondary | ICD-10-CM | POA: Diagnosis not present

## 2016-01-18 DIAGNOSIS — N2581 Secondary hyperparathyroidism of renal origin: Secondary | ICD-10-CM | POA: Diagnosis not present

## 2016-01-21 DIAGNOSIS — N186 End stage renal disease: Secondary | ICD-10-CM | POA: Diagnosis not present

## 2016-01-21 DIAGNOSIS — N2581 Secondary hyperparathyroidism of renal origin: Secondary | ICD-10-CM | POA: Diagnosis not present

## 2016-01-21 DIAGNOSIS — D509 Iron deficiency anemia, unspecified: Secondary | ICD-10-CM | POA: Diagnosis not present

## 2016-01-23 DIAGNOSIS — N186 End stage renal disease: Secondary | ICD-10-CM | POA: Diagnosis not present

## 2016-01-23 DIAGNOSIS — N2581 Secondary hyperparathyroidism of renal origin: Secondary | ICD-10-CM | POA: Diagnosis not present

## 2016-01-23 DIAGNOSIS — D509 Iron deficiency anemia, unspecified: Secondary | ICD-10-CM | POA: Diagnosis not present

## 2016-01-25 DIAGNOSIS — N186 End stage renal disease: Secondary | ICD-10-CM | POA: Diagnosis not present

## 2016-01-25 DIAGNOSIS — D509 Iron deficiency anemia, unspecified: Secondary | ICD-10-CM | POA: Diagnosis not present

## 2016-01-25 DIAGNOSIS — N2581 Secondary hyperparathyroidism of renal origin: Secondary | ICD-10-CM | POA: Diagnosis not present

## 2016-01-28 DIAGNOSIS — N186 End stage renal disease: Secondary | ICD-10-CM | POA: Diagnosis not present

## 2016-01-28 DIAGNOSIS — D509 Iron deficiency anemia, unspecified: Secondary | ICD-10-CM | POA: Diagnosis not present

## 2016-01-28 DIAGNOSIS — N2581 Secondary hyperparathyroidism of renal origin: Secondary | ICD-10-CM | POA: Diagnosis not present

## 2016-01-30 DIAGNOSIS — D509 Iron deficiency anemia, unspecified: Secondary | ICD-10-CM | POA: Diagnosis not present

## 2016-01-30 DIAGNOSIS — N186 End stage renal disease: Secondary | ICD-10-CM | POA: Diagnosis not present

## 2016-01-30 DIAGNOSIS — N2581 Secondary hyperparathyroidism of renal origin: Secondary | ICD-10-CM | POA: Diagnosis not present

## 2016-02-01 DIAGNOSIS — N2581 Secondary hyperparathyroidism of renal origin: Secondary | ICD-10-CM | POA: Diagnosis not present

## 2016-02-01 DIAGNOSIS — N186 End stage renal disease: Secondary | ICD-10-CM | POA: Diagnosis not present

## 2016-02-01 DIAGNOSIS — D509 Iron deficiency anemia, unspecified: Secondary | ICD-10-CM | POA: Diagnosis not present

## 2016-02-04 DIAGNOSIS — D509 Iron deficiency anemia, unspecified: Secondary | ICD-10-CM | POA: Diagnosis not present

## 2016-02-04 DIAGNOSIS — N186 End stage renal disease: Secondary | ICD-10-CM | POA: Diagnosis not present

## 2016-02-04 DIAGNOSIS — N2581 Secondary hyperparathyroidism of renal origin: Secondary | ICD-10-CM | POA: Diagnosis not present

## 2016-02-06 DIAGNOSIS — N186 End stage renal disease: Secondary | ICD-10-CM | POA: Diagnosis not present

## 2016-02-06 DIAGNOSIS — N2581 Secondary hyperparathyroidism of renal origin: Secondary | ICD-10-CM | POA: Diagnosis not present

## 2016-02-06 DIAGNOSIS — D509 Iron deficiency anemia, unspecified: Secondary | ICD-10-CM | POA: Diagnosis not present

## 2016-02-08 DIAGNOSIS — N2581 Secondary hyperparathyroidism of renal origin: Secondary | ICD-10-CM | POA: Diagnosis not present

## 2016-02-08 DIAGNOSIS — N186 End stage renal disease: Secondary | ICD-10-CM | POA: Diagnosis not present

## 2016-02-08 DIAGNOSIS — D509 Iron deficiency anemia, unspecified: Secondary | ICD-10-CM | POA: Diagnosis not present

## 2016-02-11 DIAGNOSIS — N2581 Secondary hyperparathyroidism of renal origin: Secondary | ICD-10-CM | POA: Diagnosis not present

## 2016-02-11 DIAGNOSIS — D509 Iron deficiency anemia, unspecified: Secondary | ICD-10-CM | POA: Diagnosis not present

## 2016-02-11 DIAGNOSIS — N186 End stage renal disease: Secondary | ICD-10-CM | POA: Diagnosis not present

## 2016-02-12 DIAGNOSIS — N186 End stage renal disease: Secondary | ICD-10-CM | POA: Diagnosis not present

## 2016-02-12 DIAGNOSIS — I120 Hypertensive chronic kidney disease with stage 5 chronic kidney disease or end stage renal disease: Secondary | ICD-10-CM | POA: Diagnosis not present

## 2016-02-12 DIAGNOSIS — Z992 Dependence on renal dialysis: Secondary | ICD-10-CM | POA: Diagnosis not present

## 2016-02-13 DIAGNOSIS — N186 End stage renal disease: Secondary | ICD-10-CM | POA: Diagnosis not present

## 2016-02-13 DIAGNOSIS — N2581 Secondary hyperparathyroidism of renal origin: Secondary | ICD-10-CM | POA: Diagnosis not present

## 2016-02-13 DIAGNOSIS — D509 Iron deficiency anemia, unspecified: Secondary | ICD-10-CM | POA: Diagnosis not present

## 2016-02-20 ENCOUNTER — Encounter (HOSPITAL_COMMUNITY): Payer: Self-pay | Admitting: Vascular Surgery

## 2016-02-20 ENCOUNTER — Inpatient Hospital Stay (HOSPITAL_COMMUNITY)
Admission: EM | Admit: 2016-02-20 | Discharge: 2016-02-26 | DRG: 286 | Disposition: A | Discharge status: Home / Self Care | Payer: Medicare Other | Attending: Internal Medicine | Admitting: Internal Medicine

## 2016-02-20 ENCOUNTER — Emergency Department (HOSPITAL_COMMUNITY): Payer: Medicare Other

## 2016-02-20 DIAGNOSIS — F1410 Cocaine abuse, uncomplicated: Secondary | ICD-10-CM | POA: Diagnosis present

## 2016-02-20 DIAGNOSIS — D649 Anemia, unspecified: Secondary | ICD-10-CM | POA: Diagnosis present

## 2016-02-20 DIAGNOSIS — M549 Dorsalgia, unspecified: Secondary | ICD-10-CM

## 2016-02-20 DIAGNOSIS — R001 Bradycardia, unspecified: Secondary | ICD-10-CM | POA: Diagnosis not present

## 2016-02-20 DIAGNOSIS — I5043 Acute on chronic combined systolic (congestive) and diastolic (congestive) heart failure: Secondary | ICD-10-CM | POA: Diagnosis not present

## 2016-02-20 DIAGNOSIS — N186 End stage renal disease: Secondary | ICD-10-CM

## 2016-02-20 DIAGNOSIS — R1011 Right upper quadrant pain: Secondary | ICD-10-CM

## 2016-02-20 DIAGNOSIS — E8770 Fluid overload, unspecified: Secondary | ICD-10-CM | POA: Diagnosis not present

## 2016-02-20 DIAGNOSIS — I5020 Unspecified systolic (congestive) heart failure: Secondary | ICD-10-CM | POA: Diagnosis not present

## 2016-02-20 DIAGNOSIS — I509 Heart failure, unspecified: Secondary | ICD-10-CM | POA: Diagnosis not present

## 2016-02-20 DIAGNOSIS — A419 Sepsis, unspecified organism: Secondary | ICD-10-CM | POA: Diagnosis not present

## 2016-02-20 DIAGNOSIS — N2581 Secondary hyperparathyroidism of renal origin: Secondary | ICD-10-CM | POA: Diagnosis not present

## 2016-02-20 DIAGNOSIS — R0602 Shortness of breath: Secondary | ICD-10-CM | POA: Diagnosis not present

## 2016-02-20 DIAGNOSIS — I440 Atrioventricular block, first degree: Secondary | ICD-10-CM | POA: Diagnosis present

## 2016-02-20 DIAGNOSIS — Z992 Dependence on renal dialysis: Secondary | ICD-10-CM

## 2016-02-20 DIAGNOSIS — E872 Acidosis: Secondary | ICD-10-CM | POA: Diagnosis present

## 2016-02-20 DIAGNOSIS — I159 Secondary hypertension, unspecified: Secondary | ICD-10-CM

## 2016-02-20 DIAGNOSIS — J189 Pneumonia, unspecified organism: Secondary | ICD-10-CM

## 2016-02-20 DIAGNOSIS — Z9115 Patient's noncompliance with renal dialysis: Secondary | ICD-10-CM | POA: Diagnosis not present

## 2016-02-20 DIAGNOSIS — R0781 Pleurodynia: Secondary | ICD-10-CM | POA: Diagnosis not present

## 2016-02-20 DIAGNOSIS — Z8249 Family history of ischemic heart disease and other diseases of the circulatory system: Secondary | ICD-10-CM

## 2016-02-20 DIAGNOSIS — I472 Ventricular tachycardia: Secondary | ICD-10-CM | POA: Diagnosis present

## 2016-02-20 DIAGNOSIS — Z9114 Patient's other noncompliance with medication regimen: Secondary | ICD-10-CM | POA: Diagnosis not present

## 2016-02-20 DIAGNOSIS — I132 Hypertensive heart and chronic kidney disease with heart failure and with stage 5 chronic kidney disease, or end stage renal disease: Secondary | ICD-10-CM | POA: Diagnosis not present

## 2016-02-20 DIAGNOSIS — I5023 Acute on chronic systolic (congestive) heart failure: Secondary | ICD-10-CM | POA: Insufficient documentation

## 2016-02-20 DIAGNOSIS — Z79899 Other long term (current) drug therapy: Secondary | ICD-10-CM | POA: Diagnosis not present

## 2016-02-20 DIAGNOSIS — I110 Hypertensive heart disease with heart failure: Secondary | ICD-10-CM | POA: Diagnosis not present

## 2016-02-20 DIAGNOSIS — R042 Hemoptysis: Secondary | ICD-10-CM | POA: Diagnosis not present

## 2016-02-20 DIAGNOSIS — I429 Cardiomyopathy, unspecified: Secondary | ICD-10-CM | POA: Diagnosis not present

## 2016-02-20 DIAGNOSIS — R05 Cough: Secondary | ICD-10-CM | POA: Diagnosis not present

## 2016-02-20 DIAGNOSIS — E875 Hyperkalemia: Secondary | ICD-10-CM

## 2016-02-20 DIAGNOSIS — M25562 Pain in left knee: Secondary | ICD-10-CM | POA: Diagnosis not present

## 2016-02-20 DIAGNOSIS — K8020 Calculus of gallbladder without cholecystitis without obstruction: Secondary | ICD-10-CM | POA: Diagnosis not present

## 2016-02-20 DIAGNOSIS — K6389 Other specified diseases of intestine: Secondary | ICD-10-CM | POA: Diagnosis not present

## 2016-02-20 DIAGNOSIS — F1910 Other psychoactive substance abuse, uncomplicated: Secondary | ICD-10-CM | POA: Diagnosis present

## 2016-02-20 DIAGNOSIS — G4700 Insomnia, unspecified: Secondary | ICD-10-CM | POA: Diagnosis not present

## 2016-02-20 DIAGNOSIS — M25561 Pain in right knee: Secondary | ICD-10-CM | POA: Diagnosis not present

## 2016-02-20 DIAGNOSIS — I4729 Other ventricular tachycardia: Secondary | ICD-10-CM

## 2016-02-20 DIAGNOSIS — E162 Hypoglycemia, unspecified: Secondary | ICD-10-CM

## 2016-02-20 DIAGNOSIS — R0600 Dyspnea, unspecified: Secondary | ICD-10-CM | POA: Diagnosis not present

## 2016-02-20 DIAGNOSIS — R079 Chest pain, unspecified: Secondary | ICD-10-CM

## 2016-02-20 DIAGNOSIS — I5042 Chronic combined systolic (congestive) and diastolic (congestive) heart failure: Secondary | ICD-10-CM | POA: Diagnosis present

## 2016-02-20 DIAGNOSIS — F17210 Nicotine dependence, cigarettes, uncomplicated: Secondary | ICD-10-CM | POA: Diagnosis present

## 2016-02-20 DIAGNOSIS — Z23 Encounter for immunization: Secondary | ICD-10-CM | POA: Diagnosis not present

## 2016-02-20 DIAGNOSIS — I371 Nonrheumatic pulmonary valve insufficiency: Secondary | ICD-10-CM | POA: Diagnosis present

## 2016-02-20 DIAGNOSIS — I10 Essential (primary) hypertension: Secondary | ICD-10-CM | POA: Diagnosis not present

## 2016-02-20 DIAGNOSIS — I340 Nonrheumatic mitral (valve) insufficiency: Secondary | ICD-10-CM | POA: Diagnosis present

## 2016-02-20 LAB — CBC WITH DIFFERENTIAL/PLATELET
Basophils Absolute: 0 10*3/uL (ref 0.0–0.1)
Basophils Relative: 1 %
EOS PCT: 1 %
Eosinophils Absolute: 0 10*3/uL (ref 0.0–0.7)
HEMATOCRIT: 36.8 % — AB (ref 39.0–52.0)
Hemoglobin: 12.6 g/dL — ABNORMAL LOW (ref 13.0–17.0)
LYMPHS ABS: 0.9 10*3/uL (ref 0.7–4.0)
LYMPHS PCT: 14 %
MCH: 32.6 pg (ref 26.0–34.0)
MCHC: 34.2 g/dL (ref 30.0–36.0)
MCV: 95.1 fL (ref 78.0–100.0)
MONOS PCT: 7 %
Monocytes Absolute: 0.5 10*3/uL (ref 0.1–1.0)
NEUTROS ABS: 5.1 10*3/uL (ref 1.7–7.7)
Neutrophils Relative %: 77 %
PLATELETS: 213 10*3/uL (ref 150–400)
RBC: 3.87 MIL/uL — ABNORMAL LOW (ref 4.22–5.81)
RDW: 17.4 % — AB (ref 11.5–15.5)
WBC: 6.6 10*3/uL (ref 4.0–10.5)

## 2016-02-20 LAB — COMPREHENSIVE METABOLIC PANEL
ALBUMIN: 3.6 g/dL (ref 3.5–5.0)
ALK PHOS: 81 U/L (ref 38–126)
ALT: 13 U/L — ABNORMAL LOW (ref 17–63)
ANION GAP: 31 — AB (ref 5–15)
AST: 22 U/L (ref 15–41)
BILIRUBIN TOTAL: 1.9 mg/dL — AB (ref 0.3–1.2)
BUN: 120 mg/dL — AB (ref 6–20)
CALCIUM: 8.9 mg/dL (ref 8.9–10.3)
CO2: 16 mmol/L — AB (ref 22–32)
CREATININE: 18.43 mg/dL — AB (ref 0.61–1.24)
Chloride: 90 mmol/L — ABNORMAL LOW (ref 101–111)
GFR calc Af Amer: 3 mL/min — ABNORMAL LOW (ref >60)
GFR calc non Af Amer: 3 mL/min — ABNORMAL LOW (ref >60)
GLUCOSE: 63 mg/dL — AB (ref 65–99)
Potassium: 7.1 mmol/L (ref 3.5–5.1)
SODIUM: 137 mmol/L (ref 135–145)
Total Protein: 7.7 g/dL (ref 6.5–8.1)

## 2016-02-20 LAB — PROTIME-INR
INR: 1.72 — ABNORMAL HIGH (ref 0.00–1.49)
Prothrombin Time: 20.1 seconds — ABNORMAL HIGH (ref 11.6–15.2)

## 2016-02-20 LAB — CBG MONITORING, ED
GLUCOSE-CAPILLARY: 167 mg/dL — AB (ref 65–99)
Glucose-Capillary: 141 mg/dL — ABNORMAL HIGH (ref 65–99)
Glucose-Capillary: 40 mg/dL — CL (ref 65–99)

## 2016-02-20 LAB — I-STAT CG4 LACTIC ACID, ED: Lactic Acid, Venous: 3.69 mmol/L (ref 0.5–2.0)

## 2016-02-20 LAB — LACTIC ACID, PLASMA: LACTIC ACID, VENOUS: 2 mmol/L (ref 0.5–2.0)

## 2016-02-20 LAB — GLUCOSE, RANDOM: Glucose, Bld: 131 mg/dL — ABNORMAL HIGH (ref 65–99)

## 2016-02-20 LAB — APTT: APTT: 34 s (ref 24–37)

## 2016-02-20 LAB — LIPASE, BLOOD: Lipase: 19 U/L (ref 11–51)

## 2016-02-20 MED ORDER — LIDOCAINE HCL (PF) 1 % IJ SOLN: 5.0000 mL | INTRAMUSCULAR | Status: DC | PRN

## 2016-02-20 MED ORDER — DEXTROSE 5 % IV SOLN: 2.0000 g | Freq: Once | INTRAVENOUS | Status: DC

## 2016-02-20 MED ORDER — HEPARIN SODIUM (PORCINE) 1000 UNIT/ML DIALYSIS: 1000.0000 [IU] | INTRAMUSCULAR | Status: DC | PRN

## 2016-02-20 MED ORDER — VANCOMYCIN HCL 10 G IV SOLR
1500.0000 mg | Freq: Once | INTRAVENOUS | Status: AC
  Administered 2016-02-20: 1500 mg via INTRAVENOUS
  Filled 2016-02-20: qty 1500

## 2016-02-20 MED ORDER — CALCIUM ACETATE 667 MG PO CAPS: 2001.0000 mg | ORAL_CAPSULE | Freq: Three times a day (TID) | ORAL | Status: DC

## 2016-02-20 MED ORDER — HYDROMORPHONE HCL 1 MG/ML IJ SOLN
0.5000 mg | Freq: Once | INTRAMUSCULAR | Status: AC
  Administered 2016-02-20: 0.5 mg via INTRAVENOUS
  Filled 2016-02-20: qty 1

## 2016-02-20 MED ORDER — ALBUTEROL SULFATE (2.5 MG/3ML) 0.083% IN NEBU
10.0000 mg | INHALATION_SOLUTION | Freq: Once | RESPIRATORY_TRACT | Status: DC
  Filled 2016-02-20: qty 12

## 2016-02-20 MED ORDER — SODIUM CHLORIDE 0.9 % IV SOLN: 100.0000 mL | INTRAVENOUS | Status: DC | PRN

## 2016-02-20 MED ORDER — HEPARIN SODIUM (PORCINE) 1000 UNIT/ML DIALYSIS
1000.0000 [IU] | INTRAMUSCULAR | Status: DC | PRN
Start: 2016-02-20 — End: 2016-02-21
  Filled 2016-02-20: qty 1

## 2016-02-20 MED ORDER — LIDOCAINE-PRILOCAINE 2.5-2.5 % EX CREA: 1.0000 "application " | TOPICAL_CREAM | CUTANEOUS | Status: DC | PRN

## 2016-02-20 MED ORDER — SODIUM CHLORIDE 0.9 % IV BOLUS (SEPSIS): 500.0000 mL | INTRAVENOUS | Status: AC

## 2016-02-20 MED ORDER — IOHEXOL 300 MG/ML  SOLN
25.0000 mL | INTRAMUSCULAR | Status: AC
  Administered 2016-02-20 (×2): 25 mL via ORAL

## 2016-02-20 MED ORDER — ALTEPLASE 2 MG IJ SOLR: 2.0000 mg | Freq: Once | INTRAMUSCULAR | Status: DC | PRN

## 2016-02-20 MED ORDER — PENTAFLUOROPROP-TETRAFLUOROETH EX AERO: 1.0000 "application " | INHALATION_SPRAY | CUTANEOUS | Status: DC | PRN

## 2016-02-20 MED ORDER — HEPARIN SODIUM (PORCINE) 1000 UNIT/ML DIALYSIS
4000.0000 [IU] | Freq: Once | INTRAMUSCULAR | Status: DC
  Filled 2016-02-20: qty 4

## 2016-02-20 MED ORDER — CARVEDILOL 25 MG PO TABS
25.0000 mg | ORAL_TABLET | Freq: Two times a day (BID) | ORAL | Status: DC
  Administered 2016-02-21 – 2016-02-26 (×9): 25 mg via ORAL
  Filled 2016-02-20 (×9): qty 1

## 2016-02-20 MED ORDER — DEXTROSE 5 % IV SOLN
2.0000 g | Freq: Once | INTRAVENOUS | Status: AC
  Administered 2016-02-20: 2 g via INTRAVENOUS
  Filled 2016-02-20 (×2): qty 2

## 2016-02-20 MED ORDER — INSULIN ASPART 100 UNIT/ML IV SOLN
10.0000 [IU] | Freq: Once | INTRAVENOUS | Status: DC
  Filled 2016-02-20: qty 1

## 2016-02-20 MED ORDER — ALBUTEROL (5 MG/ML) CONTINUOUS INHALATION SOLN
10.0000 mg/h | INHALATION_SOLUTION | RESPIRATORY_TRACT | Status: DC
  Administered 2016-02-20: 10 mg/h via RESPIRATORY_TRACT

## 2016-02-20 MED ORDER — SODIUM BICARBONATE 8.4 % IV SOLN
50.0000 meq | Freq: Once | INTRAVENOUS | Status: AC
  Administered 2016-02-20: 50 meq via INTRAVENOUS
  Filled 2016-02-20: qty 50

## 2016-02-20 MED ORDER — LOSARTAN POTASSIUM 50 MG PO TABS: 100.0000 mg | ORAL_TABLET | Freq: Every day | ORAL | Status: DC

## 2016-02-20 MED ORDER — CALCIUM CHLORIDE 10 % IV SOLN
1.0000 g | Freq: Once | INTRAVENOUS | Status: DC
  Filled 2016-02-20: qty 10

## 2016-02-20 MED ORDER — ACETAMINOPHEN 325 MG PO TABS
650.0000 mg | ORAL_TABLET | Freq: Four times a day (QID) | ORAL | Status: DC | PRN
  Administered 2016-02-20: 650 mg via ORAL
  Filled 2016-02-20: qty 2

## 2016-02-20 MED ORDER — HEPARIN SODIUM (PORCINE) 1000 UNIT/ML DIALYSIS
4000.0000 [IU] | Freq: Once | INTRAMUSCULAR | Status: DC
Start: 2016-02-20 — End: 2016-02-20

## 2016-02-20 MED ORDER — SODIUM CHLORIDE 0.9 % IV BOLUS (SEPSIS): 1000.0000 mL | INTRAVENOUS | Status: AC

## 2016-02-20 MED ORDER — ALBUTEROL SULFATE (2.5 MG/3ML) 0.083% IN NEBU
2.5000 mg | INHALATION_SOLUTION | Freq: Four times a day (QID) | RESPIRATORY_TRACT | Status: DC | PRN
  Administered 2016-02-20: 2.5 mg via RESPIRATORY_TRACT
  Filled 2016-02-20: qty 3

## 2016-02-20 MED ORDER — DEXTROSE 50 % IV SOLN
INTRAVENOUS | Status: AC
  Administered 2016-02-20: 50 mL
  Filled 2016-02-20: qty 50

## 2016-02-20 MED ORDER — SODIUM CHLORIDE 0.9 % IV SOLN
1500.0000 mg | Freq: Once | INTRAVENOUS | Status: DC
  Filled 2016-02-20: qty 1500

## 2016-02-20 MED ORDER — CALCIUM GLUCONATE 10 % IV SOLN
1.0000 g | Freq: Once | INTRAVENOUS | Status: AC
  Administered 2016-02-20: 1 g via INTRAVENOUS
  Filled 2016-02-20: qty 10

## 2016-02-20 MED ORDER — PANTOPRAZOLE SODIUM 40 MG PO TBEC
40.0000 mg | DELAYED_RELEASE_TABLET | Freq: Every day | ORAL | Status: DC
  Administered 2016-02-22 – 2016-02-26 (×4): 40 mg via ORAL
  Filled 2016-02-20 (×4): qty 1

## 2016-02-20 MED ORDER — DICLOFENAC SODIUM 1 % TD GEL
2.0000 g | Freq: Three times a day (TID) | TRANSDERMAL | Status: DC
  Administered 2016-02-21 – 2016-02-23 (×8): 2 g via TOPICAL
  Filled 2016-02-20: qty 100

## 2016-02-20 MED ORDER — DEXTROSE 10 % IV SOLN
Freq: Once | INTRAVENOUS | Status: AC
  Administered 2016-02-20: 15:00:00 via INTRAVENOUS

## 2016-02-20 MED ORDER — CALCIUM ACETATE (PHOS BINDER) 667 MG PO CAPS
2001.0000 mg | ORAL_CAPSULE | Freq: Three times a day (TID) | ORAL | Status: DC
  Administered 2016-02-21 – 2016-02-26 (×14): 2001 mg via ORAL
  Filled 2016-02-20 (×15): qty 3

## 2016-02-20 MED ORDER — ALTEPLASE 2 MG IJ SOLR
2.0000 mg | Freq: Once | INTRAMUSCULAR | Status: DC | PRN
  Filled 2016-02-20: qty 2

## 2016-02-20 MED ORDER — LOSARTAN POTASSIUM 50 MG PO TABS
100.0000 mg | ORAL_TABLET | Freq: Every day | ORAL | Status: DC
  Administered 2016-02-22 – 2016-02-24 (×3): 100 mg via ORAL
  Filled 2016-02-20 (×3): qty 2

## 2016-02-20 MED ORDER — VANCOMYCIN HCL IN DEXTROSE 1-5 GM/200ML-% IV SOLN: 1000.0000 mg | Freq: Once | INTRAVENOUS | Status: DC

## 2016-02-20 NOTE — Consult Note (Signed)
Renal Service Consult Note [facility]  [name] [name] 02/20/2016 [place] Requesting Physician:  Dr. Jeneen Rinks, ED  Reason for Consult:  ESRD pt with SOB and high K HPI: The patient is a 50 y.o. year-old with hx of HTN, substance abuse, ESRD on HD , systolic CHF who presented with cough/ SOB and back pain. Missed last 2 HD sessions due to back pain.  SOB in ED, CXR mild pulm edema, unable to speak in full sentences.  BP high in ED 167/128.  RA SaO2 97%.  K 7.1, getting IV medicaitons for acute Rx of hyperkalemia.  EKG LAFB, sinus tach., QRS 118 msec up from 100 msec 1 yr ago.   Patient himself is quite groggy and falling asleep during interview, follows simple commands.  Very drowsy. Poor historian.     ROS  denies CP  no joint pain   no HA  no blurry vision  no rash  no diarrhea  no nausea/ vomiting  no dysuria  no difficulty voiding  no change in urine color    Past Medical History  Past Medical History  Diagnosis Date  . Hypertension   . CKD (chronic kidney disease) stage 4, GFR 15-29 ml/min (HCC)   . Systolic CHF (HCC)     Ef 30-35%  . Medical non-compliance   . Polysubstance abuse   . ESRD (end stage renal disease) [facility])    Past Surgical History  Past Surgical History  Procedure Laterality Date  . Insertion of dialysis catheter  07/17/2012    Procedure: INSERTION OF DIALYSIS CATHETER;  Surgeon: Conrad [place], MD;  Location: [place];  Service: Vascular;  Laterality: Right;  right internal jugular  . Bascilic vein transposition  01/19/2013    Procedure: BASCILIC VEIN TRANSPOSITION;  Surgeon: Conrad [place], MD;  Location: [place];  Service: Vascular;  Laterality: Left;  left 2nd stage basilic vein transposition  . Venogram N/A 08/09/2012    Procedure: VENOGRAM;  Surgeon: Conrad [place], MD;  Location: [facility] CATH LAB;  Service: Cardiovascular;  Laterality: N/A;   Family History  Family History  Problem Relation Age of Onset  . Hypertension Mother   . Diabetes  Mother   . Hypertension Father    Social History  reports that he has been smoking Cigarettes.  He has been smoking about 0.25 packs per day. He does not have any smokeless tobacco history on file. He reports that he drinks about 4.2 oz of alcohol per week. He reports that he does not use illicit drugs. Allergies No Known Allergies Home medications Prior to Admission medications   Medication Sig Start Date End Date Taking? Authorizing Provider  calcium acetate (PHOSLO) 667 MG capsule Take 3 capsules (2,001 mg total) by mouth 3 (three) times daily with meals. 04/21/14   Maryann Mikhail, DO  carvedilol (COREG) 12.5 MG tablet Take 2 tablets (25 mg total) by mouth 2 (two) times daily with a meal. 03/09/14 10/25/15  Oswald Hillock, MD  famotidine (PEPCID) 20 MG tablet One at bedtime 10/09/15   Tanda Rockers, MD  gabapentin (NEURONTIN) 300 MG capsule Take 300 mg by mouth at bedtime.    Historical Provider, MD  isosorbide mononitrate (IMDUR) 60 MG 24 hr tablet Take 60 mg by mouth daily. 07/21/12   Orson Eva, MD  losartan (COZAAR) 100 MG tablet  09/17/15   Historical Provider, MD  Multiple Vitamin (MULTIVITAMIN WITH MINERALS) TABS tablet Take 1 tablet by mouth daily as needed (Nutritional supplementation).  Historical Provider, MD  pantoprazole (PROTONIX) 40 MG tablet Take 1 tablet (40 mg total) by mouth daily. Take 30-60 min before first meal of the day 10/09/15   Tanda Rockers, MD  predniSONE (DELTASONE) 10 MG tablet Take  4 each am x 2 days,   2 each am x 2 days,  1 each am x 2 days and stop 10/09/15   Tanda Rockers, MD   Liver Function Tests  Recent Labs Lab 02/20/16 1346  AST 22  ALT 13*  ALKPHOS 81  BILITOT 1.9*  PROT 7.7  ALBUMIN 3.6   No results for input(s): LIPASE, AMYLASE in the last 168 hours. CBC  Recent Labs Lab 02/20/16 1346  WBC 6.6  NEUTROABS 5.1  HGB 12.6*  HCT 36.8*  MCV 95.1  PLT 123456   Basic Metabolic Panel  Recent Labs Lab 02/20/16 1346  NA 137  K 7.1*  CL  90*  CO2 16*  GLUCOSE 63*  BUN 120*  CREATININE 18.43*  CALCIUM 8.9    Filed Vitals:   02/20/16 1436 02/20/16 1445 02/20/16 1500 02/20/16 1505  BP:  167/128 173/151   Pulse:  66 82 87  Temp:      TempSrc:      Resp:  20 21 21   Weight: 80 kg (176 lb 5.9 oz)     SpO2:  100% 96% 97%   Exam On FM O2, not in severe distress, mild tachypnea No rash, cyanosis or gangrene Sclera anicteric, throat clear ++JVD to angle of jaw Chest coarse rales/ rhonchi bilat throughout RRR no MRG Abd soft ntnd no mass or ascites noted, +bs GU normal male MS no joint effusion Ext 1-2+ bilat pretib edema LE's Neuro is groggy, nonfocal, Ox 2  CXR - bilat early pulm edema, hazy  [facility]    4h   400/A1.5  77kg   AVF  Heparin 5000   Assessment: 1 Hyperkalemia - severe, QRS slightly more prolonged than last EKG 2 Volume excess - prob pulm edema 3 HTN uncontrolled 4 Systolic CHF 5 ESRD HD MWF - missed last two HD sessions 6 Hx noncompliance 7 AMS - lethargic, smells/ looks uremic w ^^BUN/ creat   Plan - HD asap today upstairs, HD again tomorrow.  Will follow.    Kelly Splinter MD [employer] pager [phone]    cell [phone] 02/20/2016, 3:14 PM

## 2016-02-20 NOTE — Progress Notes (Signed)
Pharmacy Antibiotic Note.   58 yom with ESRD on HD admitted 02/20/2016  with pneumonia. Pt reports last HD session was 3/1. Pharmacy consulted to dose Vancomycin and Cefepime for pneumonia. WBC 6.6, afebrile.   Plan: Vancomycin 1500mg  load x1 in the ED Cefepime 2g IV x1 in the ED F/U HD plans and further antibiotic dosing Weight: 176 lb 5.9 oz (80 kg)  Temp (24hrs), Avg:97.9 F (36.6 C), Min:97.9 F (36.6 C), Max:97.9 F (36.6 C)   Recent Labs Lab 02/20/16 1346 02/20/16 1355  WBC 6.6  --   CREATININE 18.43*  --   LATICACIDVEN  --  3.69*    Estimated Creatinine Clearance: 5 mL/min (by C-G formula based on Cr of 18.43).    No Known Allergies  Antimicrobials this admission: 3/8 Vanc>> 3/8 Cefepime>>  Thank you for allowing pharmacy to be a part of this patient's care.  Halston Fairclough C. Lennox Grumbles, PharmD Pharmacy Resident  Pager: [phone] 02/20/2016 3:58 PM

## 2016-02-20 NOTE — ED Notes (Signed)
Pt reports to the ED for eval of generalized weakness, back pain, SOB, and missed dialysis. Pt states he has not been feeling well and has missed his last 2 dialysis appts. Pt reports the last time he went to dialysis was 3/1. Pt tachpnic and lethargic. Unable to speak in full sentences. Pt oriented x 4. Skin warm and dry.

## 2016-02-20 NOTE — ED Provider Notes (Signed)
Pt seen and evaluated with N. Piscotta PA-C.    Patient presents for evaluation with complaints of your back pain for last several days. States he missed his dialysis for the last 3 days. He is short of breath and dizzy and lightheaded. My examination he is lethargic but not apneic. Placed on nasal cannula O2. Sugar is low. Given IV D50 one amp.   Slowly improved his mental status. Pupils initially 1 mm, and improved to 3. Narcan was initially ordered. However this was held as his mental status improved. He had been given Dilaudid. Clinically and radiographically's in congestive heart failure. He has a potassium of 7 but no EKG changes. Given calcium, bicarbonate, D10 drip and albuterol. Is being admitted for emergent dialysis  Tanna Furry, MD 02/20/16 1609

## 2016-02-20 NOTE — ED Notes (Signed)
cbg 167 

## 2016-02-20 NOTE — H&P (Signed)
Date: 02/20/2016               Patient Name:  [name] [name] MRN: [id number]  DOB: 11-Jan-1966 Age / Sex: 50 y.o., male   PCP: Pcp Not In System         Medical Service: Internal Medicine Teaching Service         Attending Physician: Dr. Oval Linsey, MD    First Contact: Dr. Lovena Le Pager: [phone]  Second Contact: Dr. Randell Patient Pager: [phone]       After Hours (After 5p/  First Contact Pager: [phone]  weekends / holidays): Second Contact Pager: [phone]   Chief Complaint: back pain, hemoptysis  History of Present Illness: [name] [name] is a 50 yo male with ESRD (MWF), combined CHF (EF Q000111Q; grade 2 diastolic dysfunction in 0000000), HTN, and polysubstance abuse, presenting with unclear history of back pain, hemoptysis, and missed dialysis.  History was difficult to obtain as patient was somnolent, falling asleep quickly between questions.  Patient reports he has not had dialysis since last Wednesday (3/1) 2/2 back pain.  However, he states he has only had back pain for the last 2 days.  The pain is sharp, right mid back, radiating to right frontal abdomen. Nothing makes the pain better or worse.  It is not a/w eating.  He denies hematuria or dysuria.   MRI in May 2015 demonstrated normal pancreas, gallbladder thickening, left renal lesion c/f solid renal neoplasm, right renal lesion c/f benign hemorrhagic cyst but possibly solid renal neoplasm, and hemosiderosis of the liver and spleen. HIDA scan in May 2015 was negative for cholecystitis.  He had an ultrasound in August 2015 demonstrating cholelithiasis c/f chronic cholecystitis and possible right kidney neoplasm.  Patient also complains of cough for the last 2 days with yellow and bloody sputum.  He denies fever, chills, muscle aches, or sick contacts.   CXR in Oct 2016 noted vague nodular density in left midlung.  CT chest recommended at that time has not been performed.  He is a current tobacco user, but states "not that often" for a "not sure"  number of years.  He drinks 2-3 beers and 2-3 airplane bottles of liquor every couple days.  He last used cocaine 2 days ago.  He denies IVDU.  He has a h/o of combined CHF.  Echo from 2013 noted inability to rule out left atrial mass and follow up TEE or cardiac MRI was recommended, which has not been performed.  FHx: "everyone" has HTN.  He denies family h/o DMII or kidney disease.  In the ED, he was noted to be somnolent, with BUN elevated to 120, lactate 3.7 and potassium of 7.1 without ECG changes.  CXR noted cardiac enlargement, vascular congestion, and patchy left perihilar infiltrate c/f asymmetric edema vs infectious infiltrate.  He was started on Vancomycin and Cefepime in the ED.      Meds: Current Facility-Administered Medications  Medication Dose Route Frequency Provider Last Rate Last Dose  . 0.9 %  sodium chloride infusion  100 mL Intravenous PRN Roney Jaffe, MD      . 0.9 %  sodium chloride infusion  100 mL Intravenous PRN Roney Jaffe, MD      . alteplase (CATHFLO ACTIVASE) injection 2 mg  2 mg Intracatheter Once PRN Roney Jaffe, MD      . Derrill Memo ON 02/21/2016] calcium acetate (PHOSLO) capsule 2,001 mg  2,001 mg Oral TID WC Milagros Loll, MD      . carvedilol (  COREG) tablet 25 mg  25 mg Oral BID WC Milagros Loll, MD      . ceFEPIme (MAXIPIME) 2 g in dextrose 5 % 50 mL IVPB  2 g Intravenous Once [employer], PA-C      . heparin injection 1,000 Units  1,000 Units Dialysis PRN Roney Jaffe, MD      . Derrill Memo ON 02/21/2016] heparin injection 4,000 Units  4,000 Units Dialysis Once in dialysis Roney Jaffe, MD      . insulin aspart (novoLOG) injection 10 Units  10 Units Intravenous Once [employer], PA-C      . lidocaine (PF) (XYLOCAINE) 1 % injection 5 mL  5 mL Intradermal PRN Roney Jaffe, MD      . lidocaine-prilocaine (EMLA) cream 1 application  1 application Topical PRN Roney Jaffe, MD      . losartan (COZAAR) tablet 100 mg  100 mg Oral Daily Milagros Loll, MD      . Derrill Memo ON 02/21/2016] pantoprazole (PROTONIX) EC tablet 40 mg  40 mg Oral Daily Milagros Loll, MD      . pentafluoroprop-tetrafluoroeth (GEBAUERS) aerosol 1 application  1 application Topical PRN Roney Jaffe, MD      . sodium chloride 0.9 % bolus 1,000 mL  1,000 mL Intravenous Q1H Nicole Pisciotta, PA-C       Followed by  . sodium chloride 0.9 % bolus 500 mL  500 mL Intravenous Q1H Nicole Pisciotta, PA-C      . vancomycin (VANCOCIN) 1,500 mg in sodium chloride 0.9 % 500 mL IVPB  1,500 mg Intravenous Once Conrad Salado, [facility]       Current Outpatient Prescriptions  Medication Sig Dispense Refill  . calcium acetate (PHOSLO) 667 MG capsule Take 3 capsules (2,001 mg total) by mouth 3 (three) times daily with meals. 90 capsule 0  . carvedilol (COREG) 12.5 MG tablet Take 2 tablets (25 mg total) by mouth 2 (two) times daily with a meal. 30 tablet 2  . famotidine (PEPCID) 20 MG tablet One at bedtime 30 tablet 2  . gabapentin (NEURONTIN) 300 MG capsule Take 300 mg by mouth at bedtime.    . isosorbide mononitrate (IMDUR) 60 MG 24 hr tablet Take 60 mg by mouth daily.    Marland Kitchen losartan (COZAAR) 100 MG tablet     . Multiple Vitamin (MULTIVITAMIN WITH MINERALS) TABS tablet Take 1 tablet by mouth daily as needed (Nutritional supplementation).     . pantoprazole (PROTONIX) 40 MG tablet Take 1 tablet (40 mg total) by mouth daily. Take 30-60 min before first meal of the day 30 tablet 2    Allergies: Allergies as of 02/20/2016  . (No Known Allergies)   Past Medical History  Diagnosis Date  . Hypertension   . CKD (chronic kidney disease) stage 4, GFR 15-29 ml/min (HCC)   . Systolic CHF (HCC)     Ef 30-35%  . Medical non-compliance   . Polysubstance abuse   . ESRD (end stage renal disease) [facility])    Past Surgical History  Procedure Laterality Date  . Insertion of dialysis catheter  07/17/2012    Procedure: INSERTION OF DIALYSIS CATHETER;  Surgeon: Conrad [place], MD;  Location: [place];   Service: Vascular;  Laterality: Right;  right internal jugular  . Bascilic vein transposition  01/19/2013    Procedure: BASCILIC VEIN TRANSPOSITION;  Surgeon: Conrad [place], MD;  Location: [place];  Service: Vascular;  Laterality: Left;  left 2nd stage basilic vein transposition  .  Venogram N/A 08/09/2012    Procedure: VENOGRAM;  Surgeon: Conrad [place], MD;  Location: [facility] CATH LAB;  Service: Cardiovascular;  Laterality: N/A;   Family History  Problem Relation Age of Onset  . Hypertension Mother   . Diabetes Mother   . Hypertension Father    Social History   Social History  . Marital Status: Single    Spouse Name: N/A  . Number of Children: N/A  . Years of Education: N/A   Occupational History  . Not on file.   Social History Main Topics  . Smoking status: Current Every Day Smoker -- 0.25 packs/day    Types: Cigarettes    Last Attempt to Quit: 08/09/2012  . Smokeless tobacco: Not on file     Comment: Trying to quit; 3 cigs/last 2 weeks  . Alcohol Use: 4.2 oz/week    7 Cans of beer per week     Comment: Patient not forthcoming.  2-3 beers per week  . Drug Use: No     Comment: Marijuana  . Sexual Activity: Not Currently   Other Topics Concern  . Not on file   Social History Narrative    Review of Systems: Pertinent items noted in HPI and remainder of comprehensive ROS otherwise negative.  Physical Exam: Blood pressure 168/112, pulse 83, temperature 98 F (36.7 C), temperature source Oral, resp. rate 23, weight 176 lb 5.9 oz (80 kg), SpO2 93 %. Physical Exam  Constitutional:  Chronically ill appearing male, appears older than stated age, lying in bed, arousable to loud voice/touch, falls asleep quickly, slow to answer questions.  Tachypnic but not in respiratory distress.  HENT:  Head: Normocephalic and atraumatic.  Eyes: Scleral icterus is present.  Pupils 2-3 mm, sluggish, but equal, round, and reactive.  Neck: No tracheal deviation present.  Cardiovascular: Normal rate,  regular rhythm and intact distal pulses.   No murmurs or gallops appreciated.  Pulmonary/Chest: No stridor.  Tachypnic, but no respiratory distress. No use of accessory muscles. Loud, diffuse, rhonchorous breath sounds with inspiratory crackles and expiratory wheezes with forced expiration.  Abdominal: Soft. He exhibits no distension. There is no tenderness. There is no rebound and no guarding.  Protuberant, full.  No epigastric tenderness.  Musculoskeletal: He exhibits no edema.  Neurological:  Somnolent, falls asleep quickly, slow to respond, answers questions appropriately with delay.  Skin: Skin is warm and dry.     Lab results: Basic Metabolic Panel:  Recent Labs  02/20/16 1346 02/20/16 1623  NA 137  --   K 7.1*  --   CL 90*  --   CO2 16*  --   GLUCOSE 63* 131*  BUN 120*  --   CREATININE 18.43*  --   CALCIUM 8.9  --    Liver Function Tests:  Recent Labs  02/20/16 1346  AST 22  ALT 13*  ALKPHOS 81  BILITOT 1.9*  PROT 7.7  ALBUMIN 3.6   No results for input(s): LIPASE, AMYLASE in the last 72 hours. No results for input(s): AMMONIA in the last 72 hours. CBC:  Recent Labs  02/20/16 1346  WBC 6.6  NEUTROABS 5.1  HGB 12.6*  HCT 36.8*  MCV 95.1  PLT 213   Cardiac Enzymes: No results for input(s): CKTOTAL, CKMB, CKMBINDEX, TROPONINI in the last 72 hours. BNP: No results for input(s): PROBNP in the last 72 hours. D-Dimer: No results for input(s): DDIMER in the last 72 hours. CBG:  Recent Labs  02/20/16 1449 02/20/16 1500 02/20/16 1532  GLUCAP 40* 141* 167*   Hemoglobin A1C: No results for input(s): HGBA1C in the last 72 hours. Fasting Lipid Panel: No results for input(s): CHOL, HDL, LDLCALC, TRIG, CHOLHDL, LDLDIRECT in the last 72 hours. Thyroid Function Tests: No results for input(s): TSH, T4TOTAL, FREET4, T3FREE, THYROIDAB in the last 72 hours. Anemia Panel: No results for input(s): VITAMINB12, FOLATE, FERRITIN, TIBC, IRON, RETICCTPCT in the  last 72 hours. Coagulation: No results for input(s): LABPROT, INR in the last 72 hours. Urine Drug Screen: Drugs of Abuse     Component Value Date/Time   LABOPIA NONE DETECTED 04/19/2014 0109   COCAINSCRNUR POSITIVE* 04/19/2014 0109   LABBENZ NONE DETECTED 04/19/2014 0109   AMPHETMU NONE DETECTED 04/19/2014 0109   THCU NONE DETECTED 04/19/2014 0109   LABBARB NONE DETECTED 04/19/2014 0109    Alcohol Level: No results for input(s): ETH in the last 72 hours. Urinalysis: No results for input(s): COLORURINE, LABSPEC, PHURINE, GLUCOSEU, HGBUR, BILIRUBINUR, KETONESUR, PROTEINUR, UROBILINOGEN, NITRITE, LEUKOCYTESUR in the last 72 hours.  Invalid input(s): APPERANCEUR Misc. Labs:   Imaging results:  Dg Chest 2 View  02/20/2016  CLINICAL DATA:  Generalized weakness, back pain, shortness of breath, missed appointment for dialysis EXAM: CHEST  2 VIEW COMPARISON:  10/09/2015 FINDINGS: Stable moderate cardiac enlargement. Central vascular congestion. Mild hazy attenuation left perihilar region. Known posterior lateral mid left rib fracture. No pleural effusion. IMPRESSION: Cardiac enlargement with vascular congestion. Although the right lung is clear there is patchy left perihilar infiltrate. Possibilities include mild asymmetric pulmonary edema or infectious infiltrate. Electronically Signed   By: Skipper Cliche M.D.   On: 02/20/2016 14:24    Other results: EKG: 1st degree AV block (PR 231), prolonged QTc (515), severe LAD.  Assessment & Plan by Problem: Principal Problem:   ESRD (end stage renal disease) on dialysis [facility] Active Problems:   Essential hypertension   Metabolic acidosis   End stage renal disease (HCC)   Hyperkalemia   Combined congestive systolic and diastolic heart failure (HCC)   Volume overload  [name] [name] is a 50 yo male with ESRD (MWF), combined CHF (EF Q000111Q; grade 2 diastolic dysfunction in 0000000), HTN, and polysubstance abuse, presenting with unclear history of  back pain, hemoptysis, and missed dialysis.   AMS 2/2 Metabolic Derangement and ESRD: Patient is ESRD (MWF) and missed last 2 dialysis sessions.  He presents somnolent but arousable, with BUN elevated to 120 and potassium 7.1 without ECG changes.  Lactate 3.7. CXR shows volume overload vs possible infectious infiltrate. He was started on Vanc/Cefepime in the ED.  Patient's lactate likely 2/2 CHF and volume overload more so than infection.  SIRS is 1 (RR) and qSOFA 1.  Low concern for infectious etiology.  Patient has a h/o cocaine abuse and reports last use was 2 days ago.  Therefore, substance abuse may be adding to his presentation.   - Nephrology consult - Dialysis - STOP Vanc and Cefepime [ ]  UDS [ ]  Trend lactate [ ]  BCx from ED  Back Pain: Patient reports unclear history of back pain with radiating to right quadrants. He still makes urine and denies hematuria or dysuria.  WBC normal.  Especially given his previous imaging, the differential for his back pain is broad, including renal neoplasm, cholecystitis, pancreatitis, cirrhosis, and renal colic.  Unfortunately, patient unable to provide clear history of symptoms due to AMS.  Patient will need to be re-evaluated after dialysis to reassess chronology of symptoms.  - CT abdomen/pelvis with contrast [ ]  Lipase  Hemoptysis: Patient complaining of cough and blood streaked sputum for 2 days.  CXR in Oct 2016 noted nodular density in left midlung with recommendation for CT chest.  Extreme volume overload could explain his cough and hemoptysis.  Wells PE is only 1, and patient is not hypoxic or tachycardic.  As patient will need CT chest for evaluation of nodule, will also r/o PE with CTA.  As SIRS is 1, do not feel that infiltrate is infectious in etiology.  Due to recent cocaine use, cocaine induced hemorrhage is also possible, but likely a diagnosis of exclusion.  Recent Pulmonary evaluation for chronic cough does not feel that patient has COPD or  OSA. [ ]  CTA chest - Dialysis         HTN and Combined CHF: Last echo was 2013, showing EF 40-45% and grade 2 diastolic dysfunction.  At that time, right atrial mass could not be ruled out and TEE/cardiac MRI was recommended.  He now presents with volume overload 2/2 missed dialysis and elevated lactate.  Lactate likely explained by poor cardiac function in the setting of volume overload more than infection.   [ ]  TTE - Coreg 25 mg BID - Losartan 100 mg - Imdur 60 mg   Polysubstance Abuse: Patient reports last cocaine use 2 days ago, as well as alcohol and tobacco use.  He denies other illicit drugs.  - Cessation [ ]  HIV  FEN/GI: - Renal  - Protonix  DVT Ppx: Heparin [place]  Dispo: Disposition is deferred at this time, awaiting improvement of current medical problems.   The patient does not have a current PCP (Pcp Not In System) and does need an [facility] hospital follow-up appointment after discharge.  The patient does not have transportation limitations that hinder transportation to clinic appointments.  Signed: Iline Oven, MD, PhD 02/20/2016, 5:32 PM

## 2016-02-20 NOTE — Procedures (Signed)
  I was present at this dialysis session, have reviewed the session itself and made  appropriate changes Kelly Splinter MD [place] pager [phone]    cell [phone] 02/20/2016, 5:05 PM

## 2016-02-20 NOTE — ED Provider Notes (Signed)
CSN: [id number]     Arrival date & time 02/20/16  1248 History   First MD Initiated Contact with Patient 02/20/16 1421     Chief Complaint  Patient presents with  . Vascular Access Problem     (Consider location/radiation/quality/duration/timing/severity/associated sxs/prior Treatment) HPI  Blood pressure 167/128, pulse 85, temperature 97.9 F (36.6 C), temperature source Oral, resp. rate 16, weight 80 kg, SpO2 97 %.  Londyn Coday is a 50 y.o. male with past medical history significant for ESRD, has not been dialyzed in 7 days secondary to back pain complaining of generalized weakness, shortness of breath endorses productive cough without fever, chest pain, palpitations, syncope, abdominal pain. Denies prescription or illicit narcotic drug use. Patient states he took a Xanax to help him fall asleep last night.  Past Medical History  Diagnosis Date  . Hypertension   . CKD (chronic kidney disease) stage 4, GFR 15-29 ml/min (HCC)   . Systolic CHF (HCC)     Ef 30-35%  . Medical non-compliance   . Polysubstance abuse   . ESRD (end stage renal disease) [facility])    Past Surgical History  Procedure Laterality Date  . Insertion of dialysis catheter  07/17/2012    Procedure: INSERTION OF DIALYSIS CATHETER;  Surgeon: Conrad [place], MD;  Location: [place];  Service: Vascular;  Laterality: Right;  right internal jugular  . Bascilic vein transposition  01/19/2013    Procedure: BASCILIC VEIN TRANSPOSITION;  Surgeon: Conrad [place], MD;  Location: [place];  Service: Vascular;  Laterality: Left;  left 2nd stage basilic vein transposition  . Venogram N/A 08/09/2012    Procedure: VENOGRAM;  Surgeon: Conrad [place], MD;  Location: [facility] CATH LAB;  Service: Cardiovascular;  Laterality: N/A;   Family History  Problem Relation Age of Onset  . Hypertension Mother   . Diabetes Mother   . Hypertension Father    Social History  Substance Use Topics  . Smoking status: Current Every Day Smoker -- 0.25 packs/day   Types: Cigarettes    Last Attempt to Quit: 08/09/2012  . Smokeless tobacco: None     Comment: Trying to quit; 3 cigs/last 2 weeks  . Alcohol Use: 4.2 oz/week    7 Cans of beer per week     Comment: Patient not forthcoming.  2-3 beers per week    Review of Systems  10 systems reviewed and found to be negative, except as noted in the HPI.   Allergies  Review of patient's allergies indicates no known allergies.  Home Medications   Prior to Admission medications   Medication Sig Start Date End Date Taking? Authorizing Provider  calcium acetate (PHOSLO) 667 MG capsule Take 3 capsules (2,001 mg total) by mouth 3 (three) times daily with meals. 04/21/14   Maryann Mikhail, DO  carvedilol (COREG) 12.5 MG tablet Take 2 tablets (25 mg total) by mouth 2 (two) times daily with a meal. 03/09/14 10/25/15  Oswald Hillock, MD  famotidine (PEPCID) 20 MG tablet One at bedtime 10/09/15   Tanda Rockers, MD  gabapentin (NEURONTIN) 300 MG capsule Take 300 mg by mouth at bedtime.    Historical Provider, MD  isosorbide mononitrate (IMDUR) 60 MG 24 hr tablet Take 60 mg by mouth daily. 07/21/12   Orson Eva, MD  losartan (COZAAR) 100 MG tablet  09/17/15   Historical Provider, MD  Multiple Vitamin (MULTIVITAMIN WITH MINERALS) TABS tablet Take 1 tablet by mouth daily as needed (Nutritional supplementation).     Historical Provider,  MD  pantoprazole (PROTONIX) 40 MG tablet Take 1 tablet (40 mg total) by mouth daily. Take 30-60 min before first meal of the day 10/09/15   Tanda Rockers, MD  predniSONE (DELTASONE) 10 MG tablet Take  4 each am x 2 days,   2 each am x 2 days,  1 each am x 2 days and stop 10/09/15   Tanda Rockers, MD   BP 167/128 mmHg  Pulse 85  Temp(Src) 97.9 F (36.6 C) (Oral)  Resp 16  Wt 80 kg  SpO2 97% Physical Exam  Constitutional: He is oriented to person, place, and time. He appears well-developed and well-nourished. No distress.  HENT:  Head: Normocephalic.  Dry Mucous membranes  Eyes:  Conjunctivae and EOM are normal.  Cardiovascular: Normal rate, regular rhythm and intact distal pulses.   Pulmonary/Chest: Effort normal. No stridor. No respiratory distress. He has wheezes. He has no rales. He exhibits no tenderness.  Crackles to mid lung bilaterally  Abdominal: Soft. Bowel sounds are normal. He exhibits no distension and no mass. There is no tenderness. There is no rebound and no guarding.  Musculoskeletal: Normal range of motion. He exhibits edema and tenderness.  Neurological: He is alert and oriented to person, place, and time.  Psychiatric: He has a normal mood and affect.  Nursing note and vitals reviewed.   ED Course  Procedures (including critical care time)  CRITICAL CARE Performed by: Monico Blitz   Total critical care time: 45 minutes  Critical care time was exclusive of separately billable procedures and treating other patients.  Critical care was necessary to treat or prevent imminent or life-threatening deterioration.  Critical care was time spent personally by me on the following activities: development of treatment plan with patient and/or surrogate as well as nursing, discussions with consultants, evaluation of patient's response to treatment, examination of patient, obtaining history from patient or surrogate, ordering and performing treatments and interventions, ordering and review of laboratory studies, ordering and review of radiographic studies, pulse oximetry and re-evaluation of patient's condition.  Labs Review Labs Reviewed  COMPREHENSIVE METABOLIC PANEL - Abnormal; Notable for the following:    Potassium 7.1 (*)    Chloride 90 (*)    CO2 16 (*)    Glucose, Bld 63 (*)    BUN 120 (*)    Creatinine, Ser 18.43 (*)    ALT 13 (*)    Total Bilirubin 1.9 (*)    GFR calc non Af Amer 3 (*)    GFR calc Af Amer 3 (*)    Anion gap 31 (*)    All other components within normal limits  CBC WITH DIFFERENTIAL/PLATELET - Abnormal; Notable for  the following:    RBC 3.87 (*)    Hemoglobin 12.6 (*)    HCT 36.8 (*)    RDW 17.4 (*)    All other components within normal limits  I-STAT CG4 LACTIC ACID, ED - Abnormal; Notable for the following:    Lactic Acid, Venous 3.69 (*)    All other components within normal limits  CULTURE, BLOOD (ROUTINE X 2)  CULTURE, BLOOD (ROUTINE X 2)  GLUCOSE, RANDOM    Imaging Review Dg Chest 2 View  02/20/2016  CLINICAL DATA:  Generalized weakness, back pain, shortness of breath, missed appointment for dialysis EXAM: CHEST  2 VIEW COMPARISON:  10/09/2015 FINDINGS: Stable moderate cardiac enlargement. Central vascular congestion. Mild hazy attenuation left perihilar region. Known posterior lateral mid left rib fracture. No pleural effusion. IMPRESSION: Cardiac  enlargement with vascular congestion. Although the right lung is clear there is patchy left perihilar infiltrate. Possibilities include mild asymmetric pulmonary edema or infectious infiltrate. Electronically Signed   By: Skipper Cliche M.D.   On: 02/20/2016 14:24   I have personally reviewed and evaluated these images and lab results as part of my medical decision-making.   EKG Interpretation None      MDM   Final diagnoses:  Hyperkalemia  HCAP (healthcare-associated pneumonia)  Acute on chronic congestive heart failure, unspecified congestive heart failure type ([place])     Filed Vitals:   02/20/16 1436 02/20/16 1445 02/20/16 1500 02/20/16 1505  BP:  167/128 173/151   Pulse:  66 82 87  Temp:      TempSrc:      Resp:  20 21 21   Weight: 80 kg     SpO2:  100% 96% 97%    Medications  insulin aspart (novoLOG) injection 10 Units (not administered)  sodium bicarbonate injection 50 mEq (not administered)  albuterol (PROVENTIL,VENTOLIN) solution continuous neb (10 mg/hr Nebulization New Bag/Given 02/20/16 1503)  dextrose 10 % infusion ( Intravenous New Bag/Given 02/20/16 1455)  HYDROmorphone (DILAUDID) injection 0.5 mg (0.5 mg Intravenous  Given 02/20/16 1447)  dextrose 50 % solution (50 mLs  Given 02/20/16 1454)  calcium gluconate inj 10% (1 g) URGENT USE ONLY! (1 g Intravenous Given 02/20/16 1503)    Bettie Quebodeaux is 50 y.o. male presenting with somnolence, generalized weakness and severe back pain with associated cough and shortness of breath. Patient has not been dialyzed in about one week. Lung sounds with wheezing and crackles patient also has JVD, he has a history of CHF, likely volume overloaded from lack of dialysis. Patient found to be hypoglycemic 240. He is lethargic but oriented 3 and responsive to voice. Chest x-ray consistent with a left-sided infiltrate. Blood cultures are drawn given his elevated lactic acid of 4, will start him on a HCAP Regimen. Case discussed with Dr. Burnett Sheng who accepts admission for emergent dialysis given his potassium of 7.1. No EKG changes. Patient is given bicarbonate, calcium gluconate, albuterol and an infusion of insulin/D10 will be administered after patient's blood sugar remains above 200. This will be an unassigned admission to family practice.  Monico Blitz, PA-C 02/20/16 1618  Tanna Furry, MD 02/26/16 2015

## 2016-02-21 ENCOUNTER — Inpatient Hospital Stay (HOSPITAL_COMMUNITY): Payer: Medicare Other

## 2016-02-21 ENCOUNTER — Encounter (HOSPITAL_COMMUNITY): Payer: Self-pay | Admitting: Radiology

## 2016-02-21 DIAGNOSIS — M549 Dorsalgia, unspecified: Secondary | ICD-10-CM

## 2016-02-21 DIAGNOSIS — I5043 Acute on chronic combined systolic (congestive) and diastolic (congestive) heart failure: Secondary | ICD-10-CM | POA: Insufficient documentation

## 2016-02-21 DIAGNOSIS — R1011 Right upper quadrant pain: Secondary | ICD-10-CM

## 2016-02-21 DIAGNOSIS — Z720 Tobacco use: Secondary | ICD-10-CM

## 2016-02-21 DIAGNOSIS — F1010 Alcohol abuse, uncomplicated: Secondary | ICD-10-CM

## 2016-02-21 DIAGNOSIS — F1410 Cocaine abuse, uncomplicated: Secondary | ICD-10-CM

## 2016-02-21 DIAGNOSIS — R0600 Dyspnea, unspecified: Secondary | ICD-10-CM

## 2016-02-21 DIAGNOSIS — Z9115 Patient's noncompliance with renal dialysis: Secondary | ICD-10-CM

## 2016-02-21 DIAGNOSIS — I132 Hypertensive heart and chronic kidney disease with heart failure and with stage 5 chronic kidney disease, or end stage renal disease: Principal | ICD-10-CM

## 2016-02-21 DIAGNOSIS — I5023 Acute on chronic systolic (congestive) heart failure: Secondary | ICD-10-CM | POA: Insufficient documentation

## 2016-02-21 DIAGNOSIS — R042 Hemoptysis: Secondary | ICD-10-CM

## 2016-02-21 LAB — HEPATIC FUNCTION PANEL
ALK PHOS: 83 U/L (ref 38–126)
ALT: 13 U/L — ABNORMAL LOW (ref 17–63)
AST: 19 U/L (ref 15–41)
Albumin: 3.3 g/dL — ABNORMAL LOW (ref 3.5–5.0)
BILIRUBIN TOTAL: 2 mg/dL — AB (ref 0.3–1.2)
Bilirubin, Direct: 0.8 mg/dL — ABNORMAL HIGH (ref 0.1–0.5)
Indirect Bilirubin: 1.2 mg/dL — ABNORMAL HIGH (ref 0.3–0.9)
Total Protein: 7.3 g/dL (ref 6.5–8.1)

## 2016-02-21 LAB — ECHOCARDIOGRAM COMPLETE
HEIGHTINCHES: 69 in
Weight: 2751.34 oz

## 2016-02-21 LAB — RENAL FUNCTION PANEL
Albumin: 3.3 g/dL — ABNORMAL LOW (ref 3.5–5.0)
Anion gap: 25 — ABNORMAL HIGH (ref 5–15)
BUN: 58 mg/dL — AB (ref 6–20)
CHLORIDE: 90 mmol/L — AB (ref 101–111)
CO2: 20 mmol/L — ABNORMAL LOW (ref 22–32)
CREATININE: 11.46 mg/dL — AB (ref 0.61–1.24)
Calcium: 8.5 mg/dL — ABNORMAL LOW (ref 8.9–10.3)
GFR calc Af Amer: 5 mL/min — ABNORMAL LOW (ref >60)
GFR calc non Af Amer: 5 mL/min — ABNORMAL LOW (ref >60)
GLUCOSE: 82 mg/dL (ref 65–99)
Phosphorus: 7.7 mg/dL — ABNORMAL HIGH (ref 2.5–4.6)
Potassium: 4.6 mmol/L (ref 3.5–5.1)
Sodium: 135 mmol/L (ref 135–145)

## 2016-02-21 LAB — CBC
HCT: 34.7 % — ABNORMAL LOW (ref 39.0–52.0)
Hemoglobin: 11.9 g/dL — ABNORMAL LOW (ref 13.0–17.0)
MCH: 32.3 pg (ref 26.0–34.0)
MCHC: 34.3 g/dL (ref 30.0–36.0)
MCV: 94.3 fL (ref 78.0–100.0)
PLATELETS: 196 10*3/uL (ref 150–400)
RBC: 3.68 MIL/uL — ABNORMAL LOW (ref 4.22–5.81)
RDW: 17.3 % — AB (ref 11.5–15.5)
WBC: 6.6 10*3/uL (ref 4.0–10.5)

## 2016-02-21 LAB — HEPATITIS B SURFACE ANTIGEN: HEP B S AG: NEGATIVE

## 2016-02-21 LAB — GLUCOSE, CAPILLARY
GLUCOSE-CAPILLARY: 109 mg/dL — AB (ref 65–99)
GLUCOSE-CAPILLARY: 101 mg/dL — AB (ref 65–99)

## 2016-02-21 LAB — HIV ANTIBODY (ROUTINE TESTING W REFLEX): HIV SCREEN 4TH GENERATION: NONREACTIVE

## 2016-02-21 LAB — MAGNESIUM: Magnesium: 2.3 mg/dL (ref 1.7–2.4)

## 2016-02-21 LAB — MRSA PCR SCREENING: MRSA by PCR: NEGATIVE

## 2016-02-21 MED ORDER — CALCITRIOL 0.5 MCG PO CAPS
0.5000 ug | ORAL_CAPSULE | ORAL | Status: DC
  Administered 2016-02-22 – 2016-02-25 (×2): 0.5 ug via ORAL
  Filled 2016-02-21: qty 1

## 2016-02-21 MED ORDER — ACETAMINOPHEN 650 MG RE SUPP: 650.0000 mg | Freq: Four times a day (QID) | RECTAL | Status: DC | PRN

## 2016-02-21 MED ORDER — ACETAMINOPHEN 325 MG PO TABS
650.0000 mg | ORAL_TABLET | Freq: Four times a day (QID) | ORAL | Status: DC | PRN
  Administered 2016-02-21 – 2016-02-23 (×4): 650 mg via ORAL
  Filled 2016-02-21 (×4): qty 2

## 2016-02-21 MED ORDER — SODIUM CHLORIDE 0.9 % IV SOLN
125.0000 mg | INTRAVENOUS | Status: DC
  Administered 2016-02-22 – 2016-02-25 (×2): 125 mg via INTRAVENOUS
  Filled 2016-02-21 (×4): qty 10

## 2016-02-21 MED ORDER — ISOSORBIDE MONONITRATE ER 60 MG PO TB24
60.0000 mg | ORAL_TABLET | Freq: Every day | ORAL | Status: DC
  Administered 2016-02-21 – 2016-02-26 (×5): 60 mg via ORAL
  Filled 2016-02-21 (×5): qty 1

## 2016-02-21 MED ORDER — IOHEXOL 350 MG/ML SOLN
80.0000 mL | Freq: Once | INTRAVENOUS | Status: AC | PRN
Start: 2016-02-21 — End: 2016-02-21
  Administered 2016-02-21: 100 mL via INTRAVENOUS

## 2016-02-21 MED ORDER — SODIUM CHLORIDE 0.9% FLUSH
3.0000 mL | Freq: Two times a day (BID) | INTRAVENOUS | Status: DC
  Administered 2016-02-21 – 2016-02-24 (×7): 3 mL via INTRAVENOUS

## 2016-02-21 MED ORDER — CETYLPYRIDINIUM CHLORIDE 0.05 % MT LIQD: 7.0000 mL | Freq: Two times a day (BID) | OROMUCOSAL | Status: DC

## 2016-02-21 MED ORDER — HEPARIN SODIUM (PORCINE) 5000 UNIT/ML IJ SOLN
5000.0000 [IU] | Freq: Three times a day (TID) | INTRAMUSCULAR | Status: DC
  Administered 2016-02-21 – 2016-02-24 (×10): 5000 [IU] via SUBCUTANEOUS
  Filled 2016-02-21 (×9): qty 1

## 2016-02-21 NOTE — Progress Notes (Signed)
Subjective: Overnight, patient had reported 7 beat run of VTach.  He was asymptomatic.  His mental status improved following dialysis.  This morning, he still describes only 2 days of back pain, but also states that the back pain forced him to miss his last two sessions of dialysis.  He also reports first noticing worsening about abdominal girth/swelling for the last 3 weeks.    Objective: Vital signs in last 24 hours: Filed Vitals:   02/21/16 0600 02/21/16 0719 02/21/16 0725 02/21/16 0800  BP:  161/127 165/121 159/110  Pulse:  83 85 87  Temp:      TempSrc:  Oral    Resp:  16 15 23   Height:      Weight:  171 lb 15.3 oz (78 kg)    SpO2: 99% 100% 100% 99%   Weight change:   Intake/Output Summary (Last 24 hours) at 02/21/16 0838 Last data filed at 02/21/16 0100  Gross per 24 hour  Intake    300 ml  Output   3001 ml  Net  -2701 ml   Physical Exam  Constitutional: He is well-developed, well-nourished, and in no distress.  Lying in bed in dialysis.  HENT:  Head: Normocephalic and atraumatic.  Eyes: EOM are normal. Scleral icterus is present.  Neck: No tracheal deviation present.  JVD not appreciated 2/2 body habitus.  Cardiovascular: Normal rate, regular rhythm, normal heart sounds and intact distal pulses.   Pulmonary/Chest: No stridor.  Poor inspiratory effort.  Lungs clear in anterior zones.  Posterior zones could not be auscultated while in dialysis.  Abdominal:  Protuberant, full, moderately TTP in RUQ.  Murphy's sign positive. No rebound or guarding.  Musculoskeletal: He exhibits no edema.  Nontender right flank/rib cage to palpation.  Neurological: He is alert.  Skin: Skin is warm and dry.    Lab Results: Basic Metabolic Panel:  Recent Labs Lab 02/20/16 1346 02/20/16 1623 02/21/16 0512 02/21/16 0715  NA 137  --  135  --   K 7.1*  --  4.6  --   CL 90*  --  90*  --   CO2 16*  --  20*  --   GLUCOSE 63* 131* 82  --   BUN 120*  --  58*  --   CREATININE  18.43*  --  11.46*  --   CALCIUM 8.9  --  8.5*  --   MG  --   --   --  2.3  PHOS  --   --  7.7*  --    Liver Function Tests:  Recent Labs Lab 02/20/16 1346 02/21/16 0512  AST 22 19  ALT 13* 13*  ALKPHOS 81 83  BILITOT 1.9* 2.0*  PROT 7.7 7.3  ALBUMIN 3.6 3.3*  3.3*    Recent Labs Lab 02/20/16 1623  LIPASE 19   No results for input(s): AMMONIA in the last 168 hours. CBC:  Recent Labs Lab 02/20/16 1346 02/21/16 0512  WBC 6.6 6.6  NEUTROABS 5.1  --   HGB 12.6* 11.9*  HCT 36.8* 34.7*  MCV 95.1 94.3  PLT 213 196   Cardiac Enzymes: No results for input(s): CKTOTAL, CKMB, CKMBINDEX, TROPONINI in the last 168 hours. BNP: No results for input(s): PROBNP in the last 168 hours. D-Dimer: No results for input(s): DDIMER in the last 168 hours. CBG:  Recent Labs Lab 02/20/16 1449 02/20/16 1500 02/20/16 1532  GLUCAP 40* 141* 167*   Hemoglobin A1C: No results for input(s): HGBA1C in the last  168 hours. Fasting Lipid Panel: No results for input(s): CHOL, HDL, LDLCALC, TRIG, CHOLHDL, LDLDIRECT in the last 168 hours. Thyroid Function Tests: No results for input(s): TSH, T4TOTAL, FREET4, T3FREE, THYROIDAB in the last 168 hours. Coagulation:  Recent Labs Lab 02/20/16 2214  LABPROT 20.1*  INR 1.72*   Anemia Panel: No results for input(s): VITAMINB12, FOLATE, FERRITIN, TIBC, IRON, RETICCTPCT in the last 168 hours. Urine Drug Screen: Drugs of Abuse     Component Value Date/Time   LABOPIA NONE DETECTED 04/19/2014 0109   COCAINSCRNUR POSITIVE* 04/19/2014 0109   LABBENZ NONE DETECTED 04/19/2014 0109   AMPHETMU NONE DETECTED 04/19/2014 0109   THCU NONE DETECTED 04/19/2014 0109   LABBARB NONE DETECTED 04/19/2014 0109    Alcohol Level: No results for input(s): ETH in the last 168 hours. Urinalysis: No results for input(s): COLORURINE, LABSPEC, PHURINE, GLUCOSEU, HGBUR, BILIRUBINUR, KETONESUR, PROTEINUR, UROBILINOGEN, NITRITE, LEUKOCYTESUR in the last 168  hours.  Invalid input(s): APPERANCEUR Misc. Labs:   Micro Results: Recent Results (from the past 240 hour(s))  MRSA PCR Screening     Status: None   Collection Time: 02/20/16  9:53 PM  Result Value Ref Range Status   MRSA by PCR NEGATIVE NEGATIVE Final    Comment:        The GeneXpert MRSA Assay (FDA approved for NASAL specimens only), is one component of a comprehensive MRSA colonization surveillance program. It is not intended to diagnose MRSA infection nor to guide or monitor treatment for MRSA infections.    Studies/Results: Dg Chest 2 View  02/20/2016  CLINICAL DATA:  Generalized weakness, back pain, shortness of breath, missed appointment for dialysis EXAM: CHEST  2 VIEW COMPARISON:  10/09/2015 FINDINGS: Stable moderate cardiac enlargement. Central vascular congestion. Mild hazy attenuation left perihilar region. Known posterior lateral mid left rib fracture. No pleural effusion. IMPRESSION: Cardiac enlargement with vascular congestion. Although the right lung is clear there is patchy left perihilar infiltrate. Possibilities include mild asymmetric pulmonary edema or infectious infiltrate. Electronically Signed   By: Skipper Cliche M.D.   On: 02/20/2016 14:24   Ct Angio Chest Pe W/cm &/or Wo Cm  02/21/2016  CLINICAL DATA:  Sharp right mid back and frontal abdominal pain. Two days productive cough. Hemoptysis. EXAM: CT ANGIOGRAPHY CHEST CT ABDOMEN AND PELVIS WITH CONTRAST TECHNIQUE: Multidetector CT imaging of the chest was performed using the standard protocol during bolus administration of intravenous contrast. Multiplanar CT image reconstructions and MIPs were obtained to evaluate the vascular anatomy. Multidetector CT imaging of the abdomen and pelvis was performed using the standard protocol during bolus administration of intravenous contrast. CONTRAST:  144mL OMNIPAQUE IOHEXOL 350 MG/ML SOLN COMPARISON:  CT abdomen and pelvis 03/08/2014 FINDINGS: CTA CHEST FINDINGS Technically  adequate study with good opacification of the central and segmental pulmonary arteries. No focal filling defects are demonstrated. No evidence of significant pulmonary embolus. Diffuse cardiac enlargement. Reflux of contrast material into the IVC and hepatic veins consistent with passive congestion and right heart failure. No significant lymphadenopathy in the chest. Visualized mediastinal and axillary lymph nodes are prominent but without pathologic enlargement. These are likely reactive. Esophagus is decompressed. Evaluation of lungs is limited due to motion artifact but there appears to be diffuse perihilar airspace disease likely representing edema although pneumonia could also have this appearance. No pleural effusions. No pneumothorax. Airways appear patent. The nodule demonstrated on previous chest x-ray from 10/09/2015 appears to correspond to callus from a left posterior rib fracture. No destructive bone lesions. CT ABDOMEN and  PELVIS FINDINGS Cholelithiasis with mild gallbladder wall thickening. No bile duct dilatation. Liver, spleen, pancreas, adrenal glands, abdominal aorta, inferior vena cava, and retroperitoneal lymph nodes are unremarkable. There is mild diffuse abdominal and pelvic ascites. Kidneys are atrophic with heterogeneous delayed nephrograms consistent with renal insufficiency. The left renal mass seen previously is no longer present and may have been removed or resolved in the interval. No hydronephrosis in either kidney. Multiple sub cm renal cysts. Stomach, small bowel, and colon are not abnormally distended and no discrete wall thickening is appreciated. Contrast material flows through to the colon without evidence of bowel obstruction. No free air in the abdomen. Pelvis: Prostate gland is enlarged, measuring 4.2 cm diameter. Mild thickening of bladder wall may be due to hypertrophy or cystitis. Free fluid in the pelvis consistent with ascites. Fluid extends into a right inguinal hernia.  No bowel herniation. The appendix is normal. No pelvic mass or lymphadenopathy. Small left inguinal hernia containing mostly fat. No destructive bone lesions. Review of the MIP images confirms the above findings. IMPRESSION: No evidence of significant pulmonary embolus. Cardiac enlargement with evidence of right heart failure. Bilateral perihilar airspace disease probably representing edema although pneumonia could also have this appearance. Prominent lymph nodes without pathologic enlargement, likely reactive. Cholelithiasis with mild gallbladder wall thickening. Cholecystitis not excluded. Diffuse abdominal and pelvic ascites. Atrophic kidneys with heterogeneous delayed nephrogram consistent with renal insufficiency. Prostate enlargement. Electronically Signed   By: Lucienne Capers M.D.   On: 02/21/2016 02:05   Ct Abdomen Pelvis W Contrast  02/21/2016  CLINICAL DATA:  Sharp right mid back and frontal abdominal pain. Two days productive cough. Hemoptysis. EXAM: CT ANGIOGRAPHY CHEST CT ABDOMEN AND PELVIS WITH CONTRAST TECHNIQUE: Multidetector CT imaging of the chest was performed using the standard protocol during bolus administration of intravenous contrast. Multiplanar CT image reconstructions and MIPs were obtained to evaluate the vascular anatomy. Multidetector CT imaging of the abdomen and pelvis was performed using the standard protocol during bolus administration of intravenous contrast. CONTRAST:  169mL OMNIPAQUE IOHEXOL 350 MG/ML SOLN COMPARISON:  CT abdomen and pelvis 03/08/2014 FINDINGS: CTA CHEST FINDINGS Technically adequate study with good opacification of the central and segmental pulmonary arteries. No focal filling defects are demonstrated. No evidence of significant pulmonary embolus. Diffuse cardiac enlargement. Reflux of contrast material into the IVC and hepatic veins consistent with passive congestion and right heart failure. No significant lymphadenopathy in the chest. Visualized  mediastinal and axillary lymph nodes are prominent but without pathologic enlargement. These are likely reactive. Esophagus is decompressed. Evaluation of lungs is limited due to motion artifact but there appears to be diffuse perihilar airspace disease likely representing edema although pneumonia could also have this appearance. No pleural effusions. No pneumothorax. Airways appear patent. The nodule demonstrated on previous chest x-ray from 10/09/2015 appears to correspond to callus from a left posterior rib fracture. No destructive bone lesions. CT ABDOMEN and PELVIS FINDINGS Cholelithiasis with mild gallbladder wall thickening. No bile duct dilatation. Liver, spleen, pancreas, adrenal glands, abdominal aorta, inferior vena cava, and retroperitoneal lymph nodes are unremarkable. There is mild diffuse abdominal and pelvic ascites. Kidneys are atrophic with heterogeneous delayed nephrograms consistent with renal insufficiency. The left renal mass seen previously is no longer present and may have been removed or resolved in the interval. No hydronephrosis in either kidney. Multiple sub cm renal cysts. Stomach, small bowel, and colon are not abnormally distended and no discrete wall thickening is appreciated. Contrast material flows through to the colon without  evidence of bowel obstruction. No free air in the abdomen. Pelvis: Prostate gland is enlarged, measuring 4.2 cm diameter. Mild thickening of bladder wall may be due to hypertrophy or cystitis. Free fluid in the pelvis consistent with ascites. Fluid extends into a right inguinal hernia. No bowel herniation. The appendix is normal. No pelvic mass or lymphadenopathy. Small left inguinal hernia containing mostly fat. No destructive bone lesions. Review of the MIP images confirms the above findings. IMPRESSION: No evidence of significant pulmonary embolus. Cardiac enlargement with evidence of right heart failure. Bilateral perihilar airspace disease probably  representing edema although pneumonia could also have this appearance. Prominent lymph nodes without pathologic enlargement, likely reactive. Cholelithiasis with mild gallbladder wall thickening. Cholecystitis not excluded. Diffuse abdominal and pelvic ascites. Atrophic kidneys with heterogeneous delayed nephrogram consistent with renal insufficiency. Prostate enlargement. Electronically Signed   By: Lucienne Capers M.D.   On: 02/21/2016 02:05   Medications: I have reviewed the patient's current medications. Scheduled Meds: . calcium acetate  2,001 mg Oral TID WC  . carvedilol  25 mg Oral BID WC  . diclofenac sodium  2 g Topical TID AC & HS  . heparin  4,000 Units Dialysis Once in dialysis  . heparin  5,000 Units Subcutaneous 3 times per day  . insulin aspart  10 Units Intravenous Once  . losartan  100 mg Oral Daily  . pantoprazole  40 mg Oral Daily  . sodium chloride flush  3 mL Intravenous Q12H   Continuous Infusions:  PRN Meds:.sodium chloride, sodium chloride, acetaminophen **OR** acetaminophen, albuterol, alteplase, heparin, lidocaine (PF), lidocaine-prilocaine, pentafluoroprop-tetrafluoroeth Assessment/Plan: Principal Problem:   ESRD (end stage renal disease) on dialysis [facility]) Active Problems:   Essential hypertension   Metabolic acidosis   End stage renal disease (HCC)   Hyperkalemia   Combined congestive systolic and diastolic heart failure (HCC)   Volume overload  Mr. Kipps is a 51 yo male with ESRD (MWF), combined CHF (EF [id number]; grade 2 diastolic dysfunction in 0000000), HTN, and polysubstance abuse, presenting with unclear history of back pain, hemoptysis, and missed dialysis.   AMS 2/2 Metabolic Derangement and ESRD, improved: Patient is ESRD (MWF) and missed last 2 dialysis sessions. Mental status improved following dialysis with reduction of BUN from 120 to 60.  Lactate corrected.  Low concern for infection. Patient has a h/o cocaine abuse and reports last use was 2 days  ago. Therefore, substance abuse may be adding to his acute presentation.  - Nephrology consult - Dialysis [ ]  UDS [ ]  BCx from ED  HTN and Combined CHF: Last echo was 2013, showing EF 40-45% and grade 2 diastolic dysfunction. At that time, right atrial mass could not be ruled out and TEE/cardiac MRI was recommended. He now presents with volume overload 2/2 missed dialysis and elevated lactate. Lactate likely explained by poor cardiac function in the setting of volume overload more than infection. 7 beat run of VTach documented on telemetry.  Patient was asymptomatic.  If repeat TTE demonstrates EF < 40%, will consult cardiology for evaluation and consideration of ICD.  [ ]  TTE - Coreg 25 mg BID - Losartan 100 mg - Imdur 60 mg   Flank/Right Abdominal Pain: Patient reports unclear history of sharp, back/flank pain with radiating to right quadrants.  He still makes urine and denies hematuria or dysuria. His history is still unclear, though it appears the back pain preceded his missed dialysis.  Location and description of pain most c/w MSK pathology, but ribs nontender to  palpation and Murphy's sign positive.  CT abdomen negative for pancreatic or renal pathology.  It does show diffuse ascites cholelithiasis.  He has previous RUQ [place] c/w chronic cholecystitis.  Differential also includes osteomalacia 2/2 renal disease.  If RUQ [place] does not reveal active cholecystitis, will consider right rib films.  [ ]  RUQ [place]  Hemoptysis: Patient complaining of cough and blood streaked sputum for 2 days. CXR in Oct 2016 noted nodular density in left midlung with recommendation for CT chest. Extreme volume overload likely cause of his cough and hemoptysis. CTA negative for PE and previous density is due to callus of previous bone fracture.  Due to recent cocaine use, cocaine induced hemorrhage is also possible, but likely a diagnosis of exclusion.  - Dialysis   Polysubstance Abuse: Patient reports  last cocaine use 2 days ago, as well as alcohol and tobacco use. He denies other illicit drugs.  - Cessation [ ]  HIV  FEN/GI: - Renal  - Protonix  DVT Ppx: Heparin [place]  Dispo: Disposition is deferred at this time, awaiting improvement of current medical problems.  Anticipated discharge in approximately 2-3 day(s).   The patient does not have a current PCP (Pcp Not In System) and does need an [facility] hospital follow-up appointment after discharge.  The patient does not have transportation limitations that hinder transportation to clinic appointments.  .Services Needed at time of discharge: Y = Yes, Blank = No PT:   OT:   RN:   Equipment:   Other:     LOS: 1 day   Iline Oven, MD 02/21/2016, 8:38 AM

## 2016-02-21 NOTE — Progress Notes (Signed)
Patient had 22 beat of Vtach. No signs or symptoms noted.Patient denies any pain or discomfort at this time. Notified attending. Continue to monitor and notify on call if any changes.

## 2016-02-21 NOTE — Progress Notes (Signed)
Patient had 7 runs of V-tach. Pt denies any chest pain or discomfort. MD notified. Will continue to monitor.

## 2016-02-21 NOTE — Progress Notes (Signed)
Marland Kitchen  [facility] Progress Note  Assessment/Plan: 1. Hyperkalemia - resolved with HD K 4.6 today 2. ESRD - MWF - off schedule - serial HD to decrease volume, correct labs; HD again Friday to get on schedule if still here 3. Anemia - hgb 11.9 no ESA - resume Fe if still here 4. Secondary hyperparathyroidism -  Resume calcitriol 5. HTN/volume - continue to lower volume; post HD BPs are usually 120 - 130/80 - 90s at EDW; on usual outpt BP meds -BP still high had net UF 3 L Wed 6. Nutrition - renal diet/vit 7. Back pain - back still hurts - no abdominal pain - had neg CT angio and abd CT yesterday 8. Noncompliance with HD - attendance variable - often signs off early 9. Polysubstance abuse - may be contributory to HTN 10. Hx systolic CHF  [name], PA-C [place] 02/21/2016,9:58 AM  LOS: 1 day    Patient seen and agree w above A/P.  For HD today and tomorrow, get back on schedule. May be ready for dc after HD tomorrow.   Kelly Splinter MD [facility] pager [phone]    cell [phone] 02/21/2016, 11:05 AM   Subjective:   Tells me he missed HD due to back pain. Breathing better  Objective Filed Vitals:   02/21/16 0725 02/21/16 0800 02/21/16 0830 02/21/16 0900  BP: 165/121 159/110 161/105 155/110  Pulse: 85 87 90 91  Temp:      TempSrc:      Resp: 15 23 24 24   Height:      Weight:      SpO2: 100% 99% 100% 100%   Physical Exam General: NAD Heart: RRR Lungs: crackles at bases, exp wheezes Abdomen:  Soft NT Extremities:  No edema Dialysis Access: AVF  Dialysis Orders: [place] M[facility] 4h 400/A1.5 77kg AVF Heparin 5000 calcitriol 0.5 - venofer through 3/20 no ESA  Additional Objective Labs: Basic Metabolic Panel:  Recent Labs Lab 02/20/16 1346 02/20/16 1623 02/21/16 0512  NA 137  --  135  K 7.1*  --  4.6  CL 90*  --  90*  CO2 16*  --  20*  GLUCOSE 63* 131* 82  BUN 120*  --  58*  CREATININE 18.43*   --  11.46*  CALCIUM 8.9  --  8.5*  PHOS  --   --  7.7*   Liver Function Tests:  Recent Labs Lab 02/20/16 1346 02/21/16 0512  AST 22 19  ALT 13* 13*  ALKPHOS 81 83  BILITOT 1.9* 2.0*  PROT 7.7 7.3  ALBUMIN 3.6 3.3*  3.3*    Recent Labs Lab 02/20/16 1623  LIPASE 19   CBC:  Recent Labs Lab 02/20/16 1346 02/21/16 0512  WBC 6.6 6.6  NEUTROABS 5.1  --   HGB 12.6* 11.9*  HCT 36.8* 34.7*  MCV 95.1 94.3  PLT 213 196  CBG:  Recent Labs Lab 02/20/16 1449 02/20/16 1500 02/20/16 1532  GLUCAP 40* 141* 167*    Medications:   . calcium acetate  2,001 mg Oral TID WC  . carvedilol  25 mg Oral BID WC  . diclofenac sodium  2 g Topical TID AC & HS  . heparin  4,000 Units Dialysis Once in dialysis  . heparin  5,000 Units Subcutaneous 3 times per day  . insulin aspart  10 Units Intravenous Once  . losartan  100 mg Oral Daily  . pantoprazole  40 mg Oral Daily  . sodium  chloride flush  3 mL Intravenous Q12H

## 2016-02-21 NOTE — Progress Notes (Signed)
Echocardiogram 2D Echocardiogram has been performed.  [name] [name] 02/21/2016, 3:30 PM

## 2016-02-21 NOTE — H&P (Signed)
Internal Medicine Attending Admission Note Date: 02/21/2016  Patient name: [name] [name] Medical record number: [id number] Date of birth: 01-Dec-1966 Age: 50 y.o. Gender: male  I saw and evaluated the patient. I reviewed the resident's note and I agree with the resident's findings and plan as documented in the resident's note.  Chief Complaint(s): Back pain and shortness of breath.  History - key components related to admission:  [name] is a 50 year old man with a history of end-stage renal disease requiring hemodialysis, chronic systolic and diastolic heart failure, hypertension, and polysubstance abuse including cocaine and alcohol who presents with a several day history of right-sided back pain, shortness of breath, and bloody sputum. He missed hemodialysis for 1 week secondary to back pain per his report. He has had a recent upper respiratory infection and states the bloody sputum is associated with some mucus as well. He presented to the emergency department with the above complaints and was found to have a potassium of 7.1 with mild widening of his QRS complex compared to his previous tracing. He was taken to emergent dialysis and admitted to the internal medicine teaching service for further evaluation and care.  When seen on rounds the morning after admission and during his second dialysis session he felt much improved from a respiratory standpoint. He continued to note right-sided back pain that is worse with movement, deep breaths, and coughing. It is only slightly exacerbated by external compression over the area and he specifically denies any trauma although he has been coughing recently. He is without other complaints at this time.  Physical Exam - key components related to admission:  Filed Vitals:   02/21/16 0900 02/21/16 0930 02/21/16 1000 02/21/16 1030  BP: 155/110 162/104 157/115   Pulse: 91 90 90 91  Temp:      TempSrc:      Resp: 24 19 21 19   Height:      Weight:       SpO2: 100% 99% 100%    Gen.: Well-developed, well-nourished, man lying comfortably on a bed undergoing hemodialysis in no acute distress. He answers questions appropriately and is cooperative. Neck: Jugular venous reflux is not appreciated. Lungs: Bibasilar inspiratory crackles with scattered faint end expiratory wheezes. Heart: Regular rate and rhythm without murmurs, rubs, or gallops. Abdomen: Soft, active bowel sounds, mild right upper quadrant tenderness to palpation, mild guarding without rebound. Extremities: Without edema.  Lab results:  Basic Metabolic Panel:  Recent Labs  02/20/16 1346 02/20/16 1623 02/21/16 0512 02/21/16 0715  NA 137  --  135  --   K 7.1*  --  4.6  --   CL 90*  --  90*  --   CO2 16*  --  20*  --   GLUCOSE 63* 131* 82  --   BUN 120*  --  58*  --   CREATININE 18.43*  --  11.46*  --   CALCIUM 8.9  --  8.5*  --   MG  --   --   --  2.3  PHOS  --   --  7.7*  --    Liver Function Tests:  Recent Labs  02/20/16 1346 02/21/16 0512  AST 22 19  ALT 13* 13*  ALKPHOS 81 83  BILITOT 1.9* 2.0*  PROT 7.7 7.3  ALBUMIN 3.6 3.3*  3.3*    Recent Labs  02/20/16 1623  LIPASE 19   CBC:  Recent Labs  02/20/16 1346 02/21/16 0512  WBC 6.6 6.6  NEUTROABS 5.1  --  HGB 12.6* 11.9*  HCT 36.8* 34.7*  MCV 95.1 94.3  PLT 213 196   CBG:  Recent Labs  02/20/16 1449 02/20/16 1500 02/20/16 1532  GLUCAP 40* 141* 167*   Coagulation:  Recent Labs  02/20/16 2214  INR 1.72*   Misc. Labs:  Lactic acid 3.69 -> 2.0 Blood culture 2 pending HIV antibody nonreactive Hepatitis B surface antigen negative  Imaging results:  Dg Chest 2 View  02/20/2016  CLINICAL DATA:  Generalized weakness, back pain, shortness of breath, missed appointment for dialysis EXAM: CHEST  2 VIEW COMPARISON:  10/09/2015 FINDINGS: Stable moderate cardiac enlargement. Central vascular congestion. Mild hazy attenuation left perihilar region. Known posterior lateral mid left rib  fracture. No pleural effusion. IMPRESSION: Cardiac enlargement with vascular congestion. Although the right lung is clear there is patchy left perihilar infiltrate. Possibilities include mild asymmetric pulmonary edema or infectious infiltrate. Electronically Signed   By: Skipper Cliche M.D.   On: 02/20/2016 14:24   Ct Angio Chest Pe W/cm &/or Wo Cm  02/21/2016  CLINICAL DATA:  Sharp right mid back and frontal abdominal pain. Two days productive cough. Hemoptysis. EXAM: CT ANGIOGRAPHY CHEST CT ABDOMEN AND PELVIS WITH CONTRAST TECHNIQUE: Multidetector CT imaging of the chest was performed using the standard protocol during bolus administration of intravenous contrast. Multiplanar CT image reconstructions and MIPs were obtained to evaluate the vascular anatomy. Multidetector CT imaging of the abdomen and pelvis was performed using the standard protocol during bolus administration of intravenous contrast. CONTRAST:  160mL OMNIPAQUE IOHEXOL 350 MG/ML SOLN COMPARISON:  CT abdomen and pelvis 03/08/2014 FINDINGS: CTA CHEST FINDINGS Technically adequate study with good opacification of the central and segmental pulmonary arteries. No focal filling defects are demonstrated. No evidence of significant pulmonary embolus. Diffuse cardiac enlargement. Reflux of contrast material into the IVC and hepatic veins consistent with passive congestion and right heart failure. No significant lymphadenopathy in the chest. Visualized mediastinal and axillary lymph nodes are prominent but without pathologic enlargement. These are likely reactive. Esophagus is decompressed. Evaluation of lungs is limited due to motion artifact but there appears to be diffuse perihilar airspace disease likely representing edema although pneumonia could also have this appearance. No pleural effusions. No pneumothorax. Airways appear patent. The nodule demonstrated on previous chest x-ray from 10/09/2015 appears to correspond to callus from a left posterior  rib fracture. No destructive bone lesions. CT ABDOMEN and PELVIS FINDINGS Cholelithiasis with mild gallbladder wall thickening. No bile duct dilatation. Liver, spleen, pancreas, adrenal glands, abdominal aorta, inferior vena cava, and retroperitoneal lymph nodes are unremarkable. There is mild diffuse abdominal and pelvic ascites. Kidneys are atrophic with heterogeneous delayed nephrograms consistent with renal insufficiency. The left renal mass seen previously is no longer present and may have been removed or resolved in the interval. No hydronephrosis in either kidney. Multiple sub cm renal cysts. Stomach, small bowel, and colon are not abnormally distended and no discrete wall thickening is appreciated. Contrast material flows through to the colon without evidence of bowel obstruction. No free air in the abdomen. Pelvis: Prostate gland is enlarged, measuring 4.2 cm diameter. Mild thickening of bladder wall may be due to hypertrophy or cystitis. Free fluid in the pelvis consistent with ascites. Fluid extends into a right inguinal hernia. No bowel herniation. The appendix is normal. No pelvic mass or lymphadenopathy. Small left inguinal hernia containing mostly fat. No destructive bone lesions. Review of the MIP images confirms the above findings. IMPRESSION: No evidence of significant pulmonary embolus. Cardiac enlargement with  evidence of right heart failure. Bilateral perihilar airspace disease probably representing edema although pneumonia could also have this appearance. Prominent lymph nodes without pathologic enlargement, likely reactive. Cholelithiasis with mild gallbladder wall thickening. Cholecystitis not excluded. Diffuse abdominal and pelvic ascites. Atrophic kidneys with heterogeneous delayed nephrogram consistent with renal insufficiency. Prostate enlargement. Electronically Signed   By: Lucienne Capers M.D.   On: 02/21/2016 02:05   Ct Abdomen Pelvis W Contrast  02/21/2016  CLINICAL DATA:  Sharp  right mid back and frontal abdominal pain. Two days productive cough. Hemoptysis. EXAM: CT ANGIOGRAPHY CHEST CT ABDOMEN AND PELVIS WITH CONTRAST TECHNIQUE: Multidetector CT imaging of the chest was performed using the standard protocol during bolus administration of intravenous contrast. Multiplanar CT image reconstructions and MIPs were obtained to evaluate the vascular anatomy. Multidetector CT imaging of the abdomen and pelvis was performed using the standard protocol during bolus administration of intravenous contrast. CONTRAST:  159mL OMNIPAQUE IOHEXOL 350 MG/ML SOLN COMPARISON:  CT abdomen and pelvis 03/08/2014 FINDINGS: CTA CHEST FINDINGS Technically adequate study with good opacification of the central and segmental pulmonary arteries. No focal filling defects are demonstrated. No evidence of significant pulmonary embolus. Diffuse cardiac enlargement. Reflux of contrast material into the IVC and hepatic veins consistent with passive congestion and right heart failure. No significant lymphadenopathy in the chest. Visualized mediastinal and axillary lymph nodes are prominent but without pathologic enlargement. These are likely reactive. Esophagus is decompressed. Evaluation of lungs is limited due to motion artifact but there appears to be diffuse perihilar airspace disease likely representing edema although pneumonia could also have this appearance. No pleural effusions. No pneumothorax. Airways appear patent. The nodule demonstrated on previous chest x-ray from 10/09/2015 appears to correspond to callus from a left posterior rib fracture. No destructive bone lesions. CT ABDOMEN and PELVIS FINDINGS Cholelithiasis with mild gallbladder wall thickening. No bile duct dilatation. Liver, spleen, pancreas, adrenal glands, abdominal aorta, inferior vena cava, and retroperitoneal lymph nodes are unremarkable. There is mild diffuse abdominal and pelvic ascites. Kidneys are atrophic with heterogeneous delayed  nephrograms consistent with renal insufficiency. The left renal mass seen previously is no longer present and may have been removed or resolved in the interval. No hydronephrosis in either kidney. Multiple sub cm renal cysts. Stomach, small bowel, and colon are not abnormally distended and no discrete wall thickening is appreciated. Contrast material flows through to the colon without evidence of bowel obstruction. No free air in the abdomen. Pelvis: Prostate gland is enlarged, measuring 4.2 cm diameter. Mild thickening of bladder wall may be due to hypertrophy or cystitis. Free fluid in the pelvis consistent with ascites. Fluid extends into a right inguinal hernia. No bowel herniation. The appendix is normal. No pelvic mass or lymphadenopathy. Small left inguinal hernia containing mostly fat. No destructive bone lesions. Review of the MIP images confirms the above findings. IMPRESSION: No evidence of significant pulmonary embolus. Cardiac enlargement with evidence of right heart failure. Bilateral perihilar airspace disease probably representing edema although pneumonia could also have this appearance. Prominent lymph nodes without pathologic enlargement, likely reactive. Cholelithiasis with mild gallbladder wall thickening. Cholecystitis not excluded. Diffuse abdominal and pelvic ascites. Atrophic kidneys with heterogeneous delayed nephrogram consistent with renal insufficiency. Prostate enlargement. Electronically Signed   By: Lucienne Capers M.D.   On: 02/21/2016 02:05   Other results:  EKG: Normal sinus rhythm at 79 bpm, indeterminate axis, first-degree AV block, no significant Q waves, no LVH by voltage, delayed R wave progression, no ST segment changes, lateral  T wave flattening. There are no peaked T waves. Compared to the previous ECG on 02/26/2015 the QRS complexes are wider at this time and there is pseudonormalization of the T-wave in V6.  Assessment & Plan by Problem:  [name] is a  51 year old man with a history of end-stage renal disease requiring hemodialysis, chronic systolic and diastolic heart failure, hypertension, and polysubstance abuse including cocaine and alcohol who presents with a several day history of right-sided back pain, shortness of breath, and bloody sputum. He missed hemodialysis for 1 week secondary to back pain per his report. The most likely cause of his shortness of breath and blood-tinged sputum is decompensation of his combined systolic and diastolic heart failure. This was exacerbated by noncompliance with his hemodialysis resulting in fluid buildup. The cause of his right-sided back pain is less clear, but I am concerned about the possibility of a rib fracture secondary to renal osteodystrophy, particularly given the fact that he has an isolated left-sided rib fracture with callus suggesting he may have underlying renal bone disease. Because of the right upper quadrant tenderness the differential includes cholecystitis related to chronic cholelithiasis although the liver function tests are not consistent with a cholestatic pattern and the CT of the abdomen is only notable for mild thickening of the gallbladder wall. The hemoptysis may be secondary to an acute viral bronchitis as it was associated with sputum production. That being said, if it resolves with the resolution of his volume overload and pulmonary edema the cause was most likely his acute on chronic heart failure exacerbation.  1) Acute on chronic systolic and diastolic heart failure secondary to volume overload from renal dialysis noncompliance: He is undergoing dialysis for his second run this morning with marked improvement in his symptoms. Further dialysis will be as per nephrology's recommendations, but likely be back on his typical schedule. We will reassess his clinical status after the second run of dialysis including if he has continued hemoptysis.  2) Right-sided back pain: We are obtaining  a right upper quadrant ultrasound to better assess the gallbladder. If that is unremarkable we will consider obtaining spot posterior rib films on the right to take a closer look at a possible rib fracture. We will also check an intact PTH to see if he has secondary hyperparathyroidism, and if so whether further changes in his regimen are required.  3) Disposition: I anticipate he will be ready for discharge within the next 24 hours if nephrology feels that his dialysis sessions have brought him back to normal volume status and eliminated his hyperkalemia and uremia. If he does not have gallbladder disease as a cause of the pain he may respond to as needed nonsteroidal anti-inflammatories for his musculoskeletal induced pleuritic type pain.

## 2016-02-21 NOTE — Progress Notes (Signed)
Report attempted to nurse on 6E * 1. Nurse to call back for report.

## 2016-02-22 ENCOUNTER — Encounter (HOSPITAL_COMMUNITY): Payer: Self-pay | Admitting: Cardiology

## 2016-02-22 ENCOUNTER — Inpatient Hospital Stay (HOSPITAL_COMMUNITY): Payer: Medicare Other

## 2016-02-22 DIAGNOSIS — I4729 Other ventricular tachycardia: Secondary | ICD-10-CM

## 2016-02-22 DIAGNOSIS — I5043 Acute on chronic combined systolic (congestive) and diastolic (congestive) heart failure: Secondary | ICD-10-CM

## 2016-02-22 DIAGNOSIS — I472 Ventricular tachycardia: Secondary | ICD-10-CM

## 2016-02-22 DIAGNOSIS — I10 Essential (primary) hypertension: Secondary | ICD-10-CM

## 2016-02-22 HISTORY — DX: Other ventricular tachycardia: I47.29

## 2016-02-22 LAB — CBC
HCT: 31 % — ABNORMAL LOW (ref 39.0–52.0)
Hemoglobin: 10.2 g/dL — ABNORMAL LOW (ref 13.0–17.0)
MCH: 30.9 pg (ref 26.0–34.0)
MCHC: 32.9 g/dL (ref 30.0–36.0)
MCV: 93.9 fL (ref 78.0–100.0)
Platelets: 146 10*3/uL — ABNORMAL LOW (ref 150–400)
RBC: 3.3 MIL/uL — ABNORMAL LOW (ref 4.22–5.81)
RDW: 17.3 % — ABNORMAL HIGH (ref 11.5–15.5)
WBC: 4.2 10*3/uL (ref 4.0–10.5)

## 2016-02-22 LAB — RENAL FUNCTION PANEL
Albumin: 2.9 g/dL — ABNORMAL LOW (ref 3.5–5.0)
Anion gap: 14 (ref 5–15)
BUN: 32 mg/dL — ABNORMAL HIGH (ref 6–20)
CO2: 25 mmol/L (ref 22–32)
Calcium: 8.4 mg/dL — ABNORMAL LOW (ref 8.9–10.3)
Chloride: 95 mmol/L — ABNORMAL LOW (ref 101–111)
Creatinine, Ser: 8.55 mg/dL — ABNORMAL HIGH (ref 0.61–1.24)
GFR calc Af Amer: 8 mL/min — ABNORMAL LOW (ref >60)
GFR calc non Af Amer: 7 mL/min — ABNORMAL LOW (ref >60)
Glucose, Bld: 101 mg/dL — ABNORMAL HIGH (ref 65–99)
Phosphorus: 4.2 mg/dL (ref 2.5–4.6)
Potassium: 3.4 mmol/L — ABNORMAL LOW (ref 3.5–5.1)
Sodium: 134 mmol/L — ABNORMAL LOW (ref 135–145)

## 2016-02-22 LAB — GLUCOSE, CAPILLARY: Glucose-Capillary: 137 mg/dL — ABNORMAL HIGH (ref 65–99)

## 2016-02-22 MED ORDER — HEPARIN SODIUM (PORCINE) 1000 UNIT/ML DIALYSIS: 20.0000 [IU]/kg | INTRAMUSCULAR | Status: DC | PRN

## 2016-02-22 MED ORDER — ALTEPLASE 2 MG IJ SOLR
2.0000 mg | Freq: Once | INTRAMUSCULAR | Status: DC | PRN
  Filled 2016-02-22: qty 2

## 2016-02-22 MED ORDER — HYDRALAZINE HCL 25 MG PO TABS
25.0000 mg | ORAL_TABLET | Freq: Three times a day (TID) | ORAL | Status: DC
  Administered 2016-02-22 – 2016-02-23 (×2): 25 mg via ORAL
  Filled 2016-02-22 (×2): qty 1

## 2016-02-22 MED ORDER — HYDRALAZINE HCL 10 MG PO TABS
10.0000 mg | ORAL_TABLET | Freq: Three times a day (TID) | ORAL | Status: DC
  Administered 2016-02-22: 10 mg via ORAL
  Filled 2016-02-22: qty 1

## 2016-02-22 MED ORDER — NON FORMULARY: 1.5000 mg | Freq: Every evening | Status: DC | PRN

## 2016-02-22 MED ORDER — DICLOFENAC SODIUM 1 % TD GEL: 2.0000 g | Freq: Three times a day (TID) | TRANSDERMAL | Status: DC

## 2016-02-22 MED ORDER — SODIUM CHLORIDE 0.9 % IV SOLN: 125.0000 mg | INTRAVENOUS | Status: DC

## 2016-02-22 MED ORDER — LIDOCAINE HCL (PF) 1 % IJ SOLN: 5.0000 mL | INTRAMUSCULAR | Status: DC | PRN

## 2016-02-22 MED ORDER — SODIUM CHLORIDE 0.9 % IV SOLN: 100.0000 mL | INTRAVENOUS | Status: DC | PRN

## 2016-02-22 MED ORDER — VANCOMYCIN HCL IN DEXTROSE 750-5 MG/150ML-% IV SOLN: 750.0000 mg | INTRAVENOUS | Status: DC

## 2016-02-22 MED ORDER — HEPARIN SODIUM (PORCINE) 1000 UNIT/ML DIALYSIS: 1000.0000 [IU] | INTRAMUSCULAR | Status: DC | PRN

## 2016-02-22 MED ORDER — PENTAFLUOROPROP-TETRAFLUOROETH EX AERO: 1.0000 "application " | INHALATION_SPRAY | CUTANEOUS | Status: DC | PRN

## 2016-02-22 MED ORDER — CALCITRIOL 0.5 MCG PO CAPS: 0.5000 ug | ORAL_CAPSULE | ORAL | Status: DC

## 2016-02-22 MED ORDER — CALCITRIOL 0.5 MCG PO CAPS
ORAL_CAPSULE | ORAL | Status: AC
  Filled 2016-02-22: qty 1

## 2016-02-22 MED ORDER — DEXTROSE 5 % IV SOLN
2.0000 g | INTRAVENOUS | Status: DC
  Filled 2016-02-22: qty 2

## 2016-02-22 MED ORDER — PNEUMOCOCCAL VAC POLYVALENT 25 MCG/0.5ML IJ INJ
0.5000 mL | INJECTION | INTRAMUSCULAR | Status: AC
  Administered 2016-02-23: 0.5 mL via INTRAMUSCULAR
  Filled 2016-02-22: qty 0.5
  Filled 2016-02-22: qty 1

## 2016-02-22 MED ORDER — LIDOCAINE-PRILOCAINE 2.5-2.5 % EX CREA: 1.0000 "application " | TOPICAL_CREAM | CUTANEOUS | Status: DC | PRN

## 2016-02-22 MED ORDER — MELATONIN 3 MG PO TABS
1.5000 mg | ORAL_TABLET | Freq: Every evening | ORAL | Status: DC | PRN
  Administered 2016-02-22 – 2016-02-23 (×2): 1.5 mg via ORAL
  Filled 2016-02-22 (×4): qty 0.5

## 2016-02-22 MED ORDER — LOSARTAN POTASSIUM 100 MG PO TABS: 100.0000 mg | ORAL_TABLET | Freq: Every day | ORAL | Status: DC

## 2016-02-22 NOTE — Progress Notes (Signed)
Internal Medicine Attending  Date: 02/22/2016  Patient name: [name] Medical record number: [id number] Date of birth: [birth year] Age: 50 y.o. Gender: male  I saw and evaluated the patient. I reviewed the resident's note by Dr. Lovena Le and I agree with the resident's findings and plans as documented in his progress note.  [name] continues to have right-sided pain. CT angiogram was negative, right upper quadrant ultrasound failed to reveal an etiology, and his rib films were negative for fracture. I believe he's got underlying musculoskeletal pain related to his coughing. Otherwise his presenting complaints have resolved with dialysis and volume removal. He did have a 22 beat run of nonsustained V. tach yesterday evening. An echocardiogram revealed a decrease in his ejection fraction down to 25%. He has been on maximal medical therapy with an angiotensin receptor blocker and beta blocker and despite this therapy continues with a low ejection fraction. The cause could be non-ischemic related to substance abuse or may be related to underlying ischemia. Cardiology has been consulted to provide [place] with opinions on whether or not we need to rule out an ischemic source and whether or not he would qualify for an AICD given his continued depressed left ventricular ejection fraction despite the ARB and beta blocker therapy. We will proceed with any recommendations they have. If no further evaluation or intervention is necessary at this time he is stable for discharge home.

## 2016-02-22 NOTE — Progress Notes (Signed)
Subjective: Overnight, patient had 22 beat run of VTach.  He was asymptomatic.  He has continued back right flank/rib pain worsened with cough and twisting motions.  He denies CP, SOB, hemoptysis, or palpitations    Objective: Vital signs in last 24 hours: Filed Vitals:   02/22/16 0800 02/22/16 0830 02/22/16 0900 02/22/16 0930  BP: 128/82 121/99 140/102 139/90  Pulse: 72 82 81 81  Temp:      TempSrc:      Resp:      Height:      Weight:      SpO2:       Weight change: -8 lb 0.7 oz (-3.647 kg)  Intake/Output Summary (Last 24 hours) at 02/22/16 1032 Last data filed at 02/21/16 2336  Gross per 24 hour  Intake    363 ml  Output      0 ml  Net    363 ml   Physical Exam  Constitutional: He is well-developed, well-nourished, and in no distress.  Lying in bed in dialysis.  HENT:  Head: Normocephalic and atraumatic.  Eyes: EOM are normal. Scleral icterus is present.  Neck: No tracheal deviation present.  JVD not appreciated 2/2 body habitus.  Cardiovascular: Normal rate, regular rhythm, normal heart sounds and intact distal pulses.   Pulmonary/Chest: No stridor.  Poor inspiratory effort.  Diffuse inspiratory and expiratory rhonchorous sounds without wheezes or crackles.  Abdominal:  Protuberant, full. Nontender to palpation. No rebound or guarding.  Musculoskeletal: He exhibits no edema.  Nontender right flank/rib cage to palpation.  Neurological: He is alert.  Skin: Skin is warm and dry.    Lab Results: Basic Metabolic Panel:  Recent Labs Lab 02/21/16 0512 02/21/16 0715 02/22/16 0706  NA 135  --  134*  K 4.6  --  3.4*  CL 90*  --  95*  CO2 20*  --  25  GLUCOSE 82  --  101*  BUN 58*  --  32*  CREATININE 11.46*  --  8.55*  CALCIUM 8.5*  --  8.4*  MG  --  2.3  --   PHOS 7.7*  --  4.2   Liver Function Tests:  Recent Labs Lab 02/20/16 1346 02/21/16 0512 02/22/16 0706  AST 22 19  --   ALT 13* 13*  --   ALKPHOS 81 83  --   BILITOT 1.9* 2.0*  --   PROT  7.7 7.3  --   ALBUMIN 3.6 3.3*  3.3* 2.9*    Recent Labs Lab 02/20/16 1623  LIPASE 19   No results for input(s): AMMONIA in the last 168 hours. CBC:  Recent Labs Lab 02/20/16 1346 02/21/16 0512 02/22/16 0707  WBC 6.6 6.6 4.2  NEUTROABS 5.1  --   --   HGB 12.6* 11.9* 10.2*  HCT 36.8* 34.7* 31.0*  MCV 95.1 94.3 93.9  PLT 213 196 146*   Cardiac Enzymes: No results for input(s): CKTOTAL, CKMB, CKMBINDEX, TROPONINI in the last 168 hours. BNP: No results for input(s): PROBNP in the last 168 hours. D-Dimer: No results for input(s): DDIMER in the last 168 hours. CBG:  Recent Labs Lab 02/20/16 1449 02/20/16 1500 02/20/16 1532 02/21/16 1239 02/21/16 1702  GLUCAP 40* 141* 167* 109* 101*   Hemoglobin A1C: No results for input(s): HGBA1C in the last 168 hours. Fasting Lipid Panel: No results for input(s): CHOL, HDL, LDLCALC, TRIG, CHOLHDL, LDLDIRECT in the last 168 hours. Thyroid Function Tests: No results for input(s): TSH, T4TOTAL, FREET4, T3FREE, THYROIDAB in  the last 168 hours. Coagulation:  Recent Labs Lab 02/20/16 2214  LABPROT 20.1*  INR 1.72*   Anemia Panel: No results for input(s): VITAMINB12, FOLATE, FERRITIN, TIBC, IRON, RETICCTPCT in the last 168 hours. Urine Drug Screen: Drugs of Abuse     Component Value Date/Time   LABOPIA NONE DETECTED 04/19/2014 0109   COCAINSCRNUR POSITIVE* 04/19/2014 0109   LABBENZ NONE DETECTED 04/19/2014 0109   AMPHETMU NONE DETECTED 04/19/2014 0109   THCU NONE DETECTED 04/19/2014 0109   LABBARB NONE DETECTED 04/19/2014 0109    Alcohol Level: No results for input(s): ETH in the last 168 hours. Urinalysis: No results for input(s): COLORURINE, LABSPEC, PHURINE, GLUCOSEU, HGBUR, BILIRUBINUR, KETONESUR, PROTEINUR, UROBILINOGEN, NITRITE, LEUKOCYTESUR in the last 168 hours.  Invalid input(s): APPERANCEUR Misc. Labs:   Micro Results: Recent Results (from the past 240 hour(s))  Blood culture (routine x 2)     Status:  None (Preliminary result)   Collection Time: 02/20/16  3:42 PM  Result Value Ref Range Status   Specimen Description BLOOD RIGHT HAND  Final   Special Requests BOTTLES DRAWN AEROBIC ONLY 5CC  Final   Culture NO GROWTH < 24 HOURS  Final   Report Status PENDING  Incomplete  MRSA PCR Screening     Status: None   Collection Time: 02/20/16  9:53 PM  Result Value Ref Range Status   MRSA by PCR NEGATIVE NEGATIVE Final    Comment:        The GeneXpert MRSA Assay (FDA approved for NASAL specimens only), is one component of a comprehensive MRSA colonization surveillance program. It is not intended to diagnose MRSA infection nor to guide or monitor treatment for MRSA infections.    Studies/Results: Dg Chest 2 View  02/20/2016  CLINICAL DATA:  Generalized weakness, back pain, shortness of breath, missed appointment for dialysis EXAM: CHEST  2 VIEW COMPARISON:  10/09/2015 FINDINGS: Stable moderate cardiac enlargement. Central vascular congestion. Mild hazy attenuation left perihilar region. Known posterior lateral mid left rib fracture. No pleural effusion. IMPRESSION: Cardiac enlargement with vascular congestion. Although the right lung is clear there is patchy left perihilar infiltrate. Possibilities include mild asymmetric pulmonary edema or infectious infiltrate. Electronically Signed   By: Skipper Cliche M.D.   On: 02/20/2016 14:24   Ct Angio Chest Pe W/cm &/or Wo Cm  02/21/2016  CLINICAL DATA:  Sharp right mid back and frontal abdominal pain. Two days productive cough. Hemoptysis. EXAM: CT ANGIOGRAPHY CHEST CT ABDOMEN AND PELVIS WITH CONTRAST TECHNIQUE: Multidetector CT imaging of the chest was performed using the standard protocol during bolus administration of intravenous contrast. Multiplanar CT image reconstructions and MIPs were obtained to evaluate the vascular anatomy. Multidetector CT imaging of the abdomen and pelvis was performed using the standard protocol during bolus administration  of intravenous contrast. CONTRAST:  11mL OMNIPAQUE IOHEXOL 350 MG/ML SOLN COMPARISON:  CT abdomen and pelvis 03/08/2014 FINDINGS: CTA CHEST FINDINGS Technically adequate study with good opacification of the central and segmental pulmonary arteries. No focal filling defects are demonstrated. No evidence of significant pulmonary embolus. Diffuse cardiac enlargement. Reflux of contrast material into the IVC and hepatic veins consistent with passive congestion and right heart failure. No significant lymphadenopathy in the chest. Visualized mediastinal and axillary lymph nodes are prominent but without pathologic enlargement. These are likely reactive. Esophagus is decompressed. Evaluation of lungs is limited due to motion artifact but there appears to be diffuse perihilar airspace disease likely representing edema although pneumonia could also have this appearance. No pleural effusions.  No pneumothorax. Airways appear patent. The nodule demonstrated on previous chest x-ray from 10/09/2015 appears to correspond to callus from a left posterior rib fracture. No destructive bone lesions. CT ABDOMEN and PELVIS FINDINGS Cholelithiasis with mild gallbladder wall thickening. No bile duct dilatation. Liver, spleen, pancreas, adrenal glands, abdominal aorta, inferior vena cava, and retroperitoneal lymph nodes are unremarkable. There is mild diffuse abdominal and pelvic ascites. Kidneys are atrophic with heterogeneous delayed nephrograms consistent with renal insufficiency. The left renal mass seen previously is no longer present and may have been removed or resolved in the interval. No hydronephrosis in either kidney. Multiple sub cm renal cysts. Stomach, small bowel, and colon are not abnormally distended and no discrete wall thickening is appreciated. Contrast material flows through to the colon without evidence of bowel obstruction. No free air in the abdomen. Pelvis: Prostate gland is enlarged, measuring 4.2 cm diameter.  Mild thickening of bladder wall may be due to hypertrophy or cystitis. Free fluid in the pelvis consistent with ascites. Fluid extends into a right inguinal hernia. No bowel herniation. The appendix is normal. No pelvic mass or lymphadenopathy. Small left inguinal hernia containing mostly fat. No destructive bone lesions. Review of the MIP images confirms the above findings. IMPRESSION: No evidence of significant pulmonary embolus. Cardiac enlargement with evidence of right heart failure. Bilateral perihilar airspace disease probably representing edema although pneumonia could also have this appearance. Prominent lymph nodes without pathologic enlargement, likely reactive. Cholelithiasis with mild gallbladder wall thickening. Cholecystitis not excluded. Diffuse abdominal and pelvic ascites. Atrophic kidneys with heterogeneous delayed nephrogram consistent with renal insufficiency. Prostate enlargement. Electronically Signed   By: Lucienne Capers M.D.   On: 02/21/2016 02:05   Ct Abdomen Pelvis W Contrast  02/21/2016  CLINICAL DATA:  Sharp right mid back and frontal abdominal pain. Two days productive cough. Hemoptysis. EXAM: CT ANGIOGRAPHY CHEST CT ABDOMEN AND PELVIS WITH CONTRAST TECHNIQUE: Multidetector CT imaging of the chest was performed using the standard protocol during bolus administration of intravenous contrast. Multiplanar CT image reconstructions and MIPs were obtained to evaluate the vascular anatomy. Multidetector CT imaging of the abdomen and pelvis was performed using the standard protocol during bolus administration of intravenous contrast. CONTRAST:  150mL OMNIPAQUE IOHEXOL 350 MG/ML SOLN COMPARISON:  CT abdomen and pelvis 03/08/2014 FINDINGS: CTA CHEST FINDINGS Technically adequate study with good opacification of the central and segmental pulmonary arteries. No focal filling defects are demonstrated. No evidence of significant pulmonary embolus. Diffuse cardiac enlargement. Reflux of contrast  material into the IVC and hepatic veins consistent with passive congestion and right heart failure. No significant lymphadenopathy in the chest. Visualized mediastinal and axillary lymph nodes are prominent but without pathologic enlargement. These are likely reactive. Esophagus is decompressed. Evaluation of lungs is limited due to motion artifact but there appears to be diffuse perihilar airspace disease likely representing edema although pneumonia could also have this appearance. No pleural effusions. No pneumothorax. Airways appear patent. The nodule demonstrated on previous chest x-ray from 10/09/2015 appears to correspond to callus from a left posterior rib fracture. No destructive bone lesions. CT ABDOMEN and PELVIS FINDINGS Cholelithiasis with mild gallbladder wall thickening. No bile duct dilatation. Liver, spleen, pancreas, adrenal glands, abdominal aorta, inferior vena cava, and retroperitoneal lymph nodes are unremarkable. There is mild diffuse abdominal and pelvic ascites. Kidneys are atrophic with heterogeneous delayed nephrograms consistent with renal insufficiency. The left renal mass seen previously is no longer present and may have been removed or resolved in the interval. No hydronephrosis  in either kidney. Multiple sub cm renal cysts. Stomach, small bowel, and colon are not abnormally distended and no discrete wall thickening is appreciated. Contrast material flows through to the colon without evidence of bowel obstruction. No free air in the abdomen. Pelvis: Prostate gland is enlarged, measuring 4.2 cm diameter. Mild thickening of bladder wall may be due to hypertrophy or cystitis. Free fluid in the pelvis consistent with ascites. Fluid extends into a right inguinal hernia. No bowel herniation. The appendix is normal. No pelvic mass or lymphadenopathy. Small left inguinal hernia containing mostly fat. No destructive bone lesions. Review of the MIP images confirms the above findings. IMPRESSION:  No evidence of significant pulmonary embolus. Cardiac enlargement with evidence of right heart failure. Bilateral perihilar airspace disease probably representing edema although pneumonia could also have this appearance. Prominent lymph nodes without pathologic enlargement, likely reactive. Cholelithiasis with mild gallbladder wall thickening. Cholecystitis not excluded. Diffuse abdominal and pelvic ascites. Atrophic kidneys with heterogeneous delayed nephrogram consistent with renal insufficiency. Prostate enlargement. Electronically Signed   By: Lucienne Capers M.D.   On: 02/21/2016 02:05   US Abdomen Limited Ruq  02/21/2016  CLINICAL DATA:  Right upper quadrant abdominal pain. EXAM: US ABDOMEN LIMITED - RIGHT UPPER QUADRANT COMPARISON:  02/21/2016 FINDINGS: Gallbladder: Gallbladder wall thickening at 4 mm. Flat 7 mm gallstone observed. Sonographic Murphy's sign absent. Common bile duct: Diameter: 4 mm Liver: No focal lesion identified. Within normal limits in parenchymal echogenicity. Other: Perihepatic ascites as on prior images such as 02/21/2016. IMPRESSION: 1. Flat 7 mm gallstone. 2. Gallbladder wall thickening with perihepatic ascites as on the prior CT. Gallbladder wall thickening could be from inflammation although could also be from the patient's known hypoalbuminemia. Electronically Signed   By: Van Clines M.D.   On: 02/21/2016 13:52   Medications: I have reviewed the patient's current medications. Scheduled Meds: . calcitRIOL      . calcitRIOL  0.5 mcg Oral Q M,W,F-HD  . calcium acetate  2,001 mg Oral TID WC  . carvedilol  25 mg Oral BID WC  . ceFEPime (MAXIPIME) IV  2 g Intravenous Q M,W,F-HD  . diclofenac sodium  2 g Topical TID AC & HS  . ferric gluconate (FERRLECIT/NULECIT) IV  125 mg Intravenous Q M,W,F-HD  . heparin  4,000 Units Dialysis Once in dialysis  . heparin  5,000 Units Subcutaneous 3 times per day  . insulin aspart  10 Units Intravenous Once  . isosorbide  mononitrate  60 mg Oral Daily  . losartan  100 mg Oral Daily  . pantoprazole  40 mg Oral Daily  . sodium chloride flush  3 mL Intravenous Q12H  . vancomycin  750 mg Intravenous Q M,W,F-HD   Continuous Infusions:  PRN Meds:.sodium chloride, sodium chloride, sodium chloride, sodium chloride, acetaminophen **OR** acetaminophen, albuterol, alteplase, heparin, heparin, lidocaine (PF), lidocaine-prilocaine, pentafluoroprop-tetrafluoroeth Assessment/Plan: Principal Problem:   ESRD (end stage renal disease) on dialysis [facility]) Active Problems:   Essential hypertension   Metabolic acidosis   End stage renal disease (HCC)   Hyperkalemia   Combined congestive systolic and diastolic heart failure (HCC)   Volume overload   Acute on chronic combined systolic and diastolic heart failure (HCC)   Back pain  [name] [name] is a 50 yo male with ESRD (MWF), combined CHF (EF [id number]; grade 2 diastolic dysfunction in 0000000), HTN, and polysubstance abuse, presenting with unclear history of back pain, hemoptysis, and missed dialysis.   Combined CHF, NSVT, and HTN: Repeat echo demonstrates EF 25-30%  and grade 2 diastolic dysfunction.  Overnight, asymptomatic 22 beat run of VTach documented on telemetry.  Given worsening cardiac function and intermittent dysrhythmia, will consult cardiology for evaluation of need for ICD placement. - Cardiology consult - Coreg 25 mg BID - Losartan 100 mg - Imdur 60 mg   AMS 2/2 Metabolic Derangement and ESRD, improved: Patient is ESRD (MWF) and missed last 2 dialysis sessions. Mental status improved following dialysis with reduction of BUN from 120 to 60.  Lactate corrected.  Low concern for infection. Nephrology believes patient stable for discharge from renal disease standpoint. - Nephrology consult - Dialysis [ ]  UDS [ ]  BCx from ED  Flank/Right Abdominal Pain: Patient reports unclear history of sharp, back/flank pain with radiating to right quadrants.  He still makes urine  and denies hematuria or dysuria. His history is still unclear, though it appears the back pain preceded his missed dialysis.  Location and description of pain most c/w [facility] pathology. RUQ US shows nonobstructing stone, gallbladder wall thickening (which is chronic for patient), and normal CBD without dilation.  Will check PTH given his renal dysfunction and check rib films. [ ]  Right rib plain films [ ]  PTH  Hemoptysis: Patient complaining of cough and blood streaked sputum for 2 days. CXR in Oct 2016 noted nodular density in left midlung with recommendation for CT chest. Extreme volume overload likely cause of his cough and hemoptysis. CTA negative for PE and previous density is due to callus of previous bone fracture.  Due to recent cocaine use, cocaine induced hemorrhage is also possible, but likely a diagnosis of exclusion.  - Dialysis   Polysubstance Abuse: Patient reports last cocaine use 2 days ago, as well as alcohol and tobacco use. He denies other illicit drugs. HIV negative - Cessation  FEN/GI: - Renal  - Protonix  DVT Ppx: Heparin [place]  Dispo: Disposition is deferred at this time, awaiting improvement of current medical problems.  Anticipated discharge in approximately 2-3 day(s).   The patient does not have a current PCP (Pcp Not In System) and does need an [facility] hospital follow-up appointment after discharge.  The patient does not have transportation limitations that hinder transportation to clinic appointments.  .Services Needed at time of discharge: Y = Yes, Blank = No PT:   OT:   RN:   Equipment:   Other:     LOS: 2 days   [name] Oven, MD 02/22/2016, 10:32 AM

## 2016-02-22 NOTE — Consult Note (Signed)
CARDIOLOGY CONSULT NOTE   Patient ID: [name] [name] MRN: [id number] DOB/AGE: [birth year] 48 y.o.  Admit date: 02/20/2016  Primary Physician   Pcp Not In System Primary Cardiologist   New Reason for Consultation   NSVT Requesting Physician  Dr. Eppie Gibson  HPI: [name] [name] is a 50 y.o. male with a history of HTN, ESRD on HD, polysubstance abuse including cocaine and alcohol abuse, chronic systolic and diastolic HF, medication non compliance, Secondary hyperparathyroidism and anemia who brought to [facility] 3/8 with complaint of cough/shortness of breath and back pain. He had missed 2 prior hemodialysis session due to back pain. Workup in ED revealed mild pulmonary edema, cardiac enlargement, potassium of 7.1 and taken for emergent hemodialysis. He was started on Vancomycin and Cefepime in the ED. His mental status improved after dialysis.  EKG on admission shows normal sinus rhythm at rate of 79 bpm, prolonged PR interval and QTc of 567ms.  Echocardiogram during this admission 3/9 shows left ventricular function of 25-30%, mild LVH, grade 2 diastolic dysfunction, mild mitral regurgitation, she file dilated left atrium, mild dilated right ventricle with reduced systolic function, moderate pulmonic regurgitation, PA peak pressure of 55 mmHg and finding consistent with elevated central venous pressure. Review of telemetry shows multiple episode of nonsustained VT ranging 3 beats to 21 beats. 21 beats 3/9 @ 1802. 13 beats at 3/10 @ 0734. Rate mostly stable in 60s to 70s. Intermittently goes to high 40s. The patient seems asymptomatic, unable to provide specific information.  He complains of right flank pain that worsens with cough and movement. The patient denies nausea, vomiting, fever, chest pain, palpitations, orthopnea, PND, dizziness, syncope,  abdominal pain, hematochezia, melena, lower extremity edema. He drinks 2-3 beers and 2-3 airplane bottles of liquor every couple days. He last used  cocaine 02/18/16 He denies IVDU.  Echo from 2013 noted inability to rule out left atrial mass and follow up TEE or cardiac MRI was recommended, which has not been performed. The patient was seen by Dr. Curly Shores 11/12/11for VT in setting of + cocaine and hypertensive urgency and renal dysfunction. Advice was out period of cocaine and medical therapy and reassess LV EF afterwards.    Past Medical History  Diagnosis Date  . Hypertension   . CKD (chronic kidney disease) stage 4, GFR 15-29 ml/min (HCC)   . Systolic CHF (HCC)     Ef 30-35%  . Medical non-compliance   . Polysubstance abuse   . ESRD (end stage renal disease) [facility])      Past Surgical History  Procedure Laterality Date  . Insertion of dialysis catheter  07/17/2012    Procedure: INSERTION OF DIALYSIS CATHETER;  Surgeon: Conrad Oldtown, MD;  Location: [place];  Service: Vascular;  Laterality: Right;  right internal jugular  . Bascilic vein transposition  01/19/2013    Procedure: BASCILIC VEIN TRANSPOSITION;  Surgeon: Conrad [place], MD;  Location: [place];  Service: Vascular;  Laterality: Left;  left 2nd stage basilic vein transposition  . Venogram N/A 08/09/2012    Procedure: VENOGRAM;  Surgeon: Conrad [place], MD;  Location: [facility] CATH LAB;  Service: Cardiovascular;  Laterality: N/A;    No Known Allergies  I have reviewed the patient's current medications . calcitRIOL      . calcitRIOL  0.5 mcg Oral Q M,W,F-HD  . calcium acetate  2,001 mg Oral TID WC  . carvedilol  25 mg Oral BID WC  . ceFEPime (MAXIPIME) IV  2 g Intravenous  Q M,W,F-HD  . diclofenac sodium  2 g Topical TID AC & HS  . ferric gluconate (FERRLECIT/NULECIT) IV  125 mg Intravenous Q M,W,F-HD  . heparin  4,000 Units Dialysis Once in dialysis  . heparin  5,000 Units Subcutaneous 3 times per day  . insulin aspart  10 Units Intravenous Once  . isosorbide mononitrate  60 mg Oral Daily  . losartan  100 mg Oral Daily  . pantoprazole  40 mg Oral Daily  . sodium chloride flush   3 mL Intravenous Q12H  . vancomycin  750 mg Intravenous Q M,W,F-HD     sodium chloride, sodium chloride, sodium chloride, sodium chloride, acetaminophen **OR** acetaminophen, albuterol, alteplase, heparin, heparin, lidocaine (PF), lidocaine-prilocaine, pentafluoroprop-tetrafluoroeth  Prior to Admission medications   Medication Sig Start Date End Date Taking? Authorizing Provider  calcium acetate (PHOSLO) 667 MG capsule Take 3 capsules (2,001 mg total) by mouth 3 (three) times daily with meals. 04/21/14  Yes Maryann Mikhail, DO  carvedilol (COREG) 12.5 MG tablet Take 2 tablets (25 mg total) by mouth 2 (two) times daily with a meal. 03/09/14 02/21/16 Yes Oswald Hillock, MD  isosorbide mononitrate (IMDUR) 60 MG 24 hr tablet Take 60 mg by mouth daily. 07/21/12  Yes Orson Eva, MD  Multiple Vitamin (MULTIVITAMIN WITH MINERALS) TABS tablet Take 1 tablet by mouth daily as needed (Nutritional supplementation). Reported on 02/21/2016   Yes Historical Provider, MD  rOPINIRole (REQUIP) 0.5 MG tablet Take 0.5 mg by mouth 2 (two) times daily. 0.5mg  at bedtime and 0.5mg  at dialysis 12/20/15  Yes Historical Provider, MD     Social History   Social History  . Marital Status: Single    Spouse Name: N/A  . Number of Children: N/A  . Years of Education: N/A   Occupational History  . Not on file.   Social History Main Topics  . Smoking status: Current Every Day Smoker -- 0.25 packs/day    Types: Cigarettes    Last Attempt to Quit: 08/09/2012  . Smokeless tobacco: Not on file     Comment: Trying to quit; 3 cigs/last 2 weeks  . Alcohol Use: 4.2 oz/week    7 Cans of beer per week     Comment: Patient not forthcoming.  2-3 beers per week  . Drug Use: No     Comment: Marijuana  . Sexual Activity: Not Currently   Other Topics Concern  . Not on file   Social History Narrative    Family Status  Relation Status Death Age  . Mother Alive   . Father Alive    Family History  Problem Relation Age of Onset  .  Hypertension Mother   . Diabetes Mother   . Hypertension Father      ROS:  Full 14 point review of systems complete and found to be negative unless listed above.  Physical Exam: Blood pressure 139/90, pulse 81, temperature 97.1 F (36.2 C), temperature source Oral, resp. rate 21, height 5\' 9"  (1.753 m), weight 167 lb 15.9 oz (76.2 kg), SpO2 97 %.  General: Well developed, well nourished, male in no acute distress. Seen in hemodialysis center. Head: Eyes PERRLA, No xanthomas. Normocephalic and atraumatic, oropharynx without edema or exudate.  Lungs: Resp regular and unlabored. Bibasilar faint crackles with expiratory wheezing. Heart: RRR no s3, s4, or murmurs.  Neck: No carotid bruits. No lymphadenopathy.  Abdomen: Bowel sounds present, abdomen soft with diffuse tenderness Msk:  No spine tenderness. No weakness, no joint deformities or effusions. R  flank tenderness Extremities: No clubbing, cyanosis or edema. DP/PT/Radials 2+ and equal bilaterally. Neuro: Alert and oriented X 3. No focal deficits noted. Psych:  Good affect, responds appropriately Skin: No rashes or lesions noted.  Labs:   Lab Results  Component Value Date   WBC 4.2 02/22/2016   HGB 10.2* 02/22/2016   HCT 31.0* 02/22/2016   MCV 93.9 02/22/2016   PLT 146* 02/22/2016    Recent Labs  02/20/16 2214  INR 1.72*    Recent Labs Lab 02/21/16 0512 02/22/16 0706  NA 135 134*  K 4.6 3.4*  CL 90* 95*  CO2 20* 25  BUN 58* 32*  CREATININE 11.46* 8.55*  CALCIUM 8.5* 8.4*  PROT 7.3  --   BILITOT 2.0*  --   ALKPHOS 83  --   ALT 13*  --   AST 19  --   GLUCOSE 82 101*  ALBUMIN 3.3*  3.3* 2.9*  No results found for: DDIMER LIPASE  Date/Time Value Ref Range Status  02/20/2016 04:23 PM 19 11 - 51 U/L Final   Echo:02/21/16   LV EF: 25% - 30%  ------------------------------------------------------------------- Indications: Dyspnea 786.09. Renal Failure chronic  585.0.  ------------------------------------------------------------------- History: Risk factors: Current tobacco use. Hypertension.  ------------------------------------------------------------------- Study Conclusions  - Left ventricle: The cavity size was normal. Wall thickness was  increased in a pattern of mild LVH. There was mild concentric  hypertrophy. Systolic function was severely reduced. The  estimated ejection fraction was in the range of 25% to 30%.  Diffuse hypokinesis. Features are consistent with a pseudonormal  left ventricular filling pattern, with concomitant abnormal  relaxation and increased filling pressure (grade 2 diastolic  dysfunction). Doppler parameters are consistent with high  ventricular filling pressure. - Aortic valve: Transvalvular velocity was within the normal range.  There was no stenosis. There was no regurgitation. - Mitral valve: There was mild regurgitation. - Left atrium: The atrium was severely dilated. - Right ventricle: The cavity size was mildly dilated. Wall  thickness was normal. Systolic function was moderately reduced. - Right atrium: The atrium was severely dilated. - Atrial septum: No defect or patent foramen ovale was identified  by color flow Doppler. - Tricuspid valve: There was mild regurgitation. - Pulmonic valve: There was moderate regurgitation. - Pulmonary arteries: Systolic pressure was severely increased. PA  peak pressure: 55 mm Hg (S). - Inferior vena cava: The vessel was dilated. The respirophasic  diameter changes were blunted (< 50%), consistent with elevated  central venous pressure.  ECG:  Vent. rate 79 BPM PR interval 231 ms QRS duration 118 ms QT/QTc 449/515 ms P-R-T axes 71 244 65   Radiology:  Dg Chest 2 View  02/20/2016  CLINICAL DATA:  Generalized weakness, back pain, shortness of breath, missed appointment for dialysis EXAM: CHEST  2 VIEW COMPARISON:  10/09/2015 FINDINGS: Stable  moderate cardiac enlargement. Central vascular congestion. Mild hazy attenuation left perihilar region. Known posterior lateral mid left rib fracture. No pleural effusion. IMPRESSION: Cardiac enlargement with vascular congestion. Although the right lung is clear there is patchy left perihilar infiltrate. Possibilities include mild asymmetric pulmonary edema or infectious infiltrate. Electronically Signed   By: Skipper Cliche M.D.   On: 02/20/2016 14:24   Ct Angio Chest Pe W/cm &/or Wo Cm  02/21/2016  CLINICAL DATA:  Sharp right mid back and frontal abdominal pain. Two days productive cough. Hemoptysis. EXAM: CT ANGIOGRAPHY CHEST CT ABDOMEN AND PELVIS WITH CONTRAST TECHNIQUE: Multidetector CT imaging of the chest was performed using the  standard protocol during bolus administration of intravenous contrast. Multiplanar CT image reconstructions and MIPs were obtained to evaluate the vascular anatomy. Multidetector CT imaging of the abdomen and pelvis was performed using the standard protocol during bolus administration of intravenous contrast. CONTRAST:  160mL OMNIPAQUE IOHEXOL 350 MG/ML SOLN COMPARISON:  CT abdomen and pelvis 03/08/2014 FINDINGS: CTA CHEST FINDINGS Technically adequate study with good opacification of the central and segmental pulmonary arteries. No focal filling defects are demonstrated. No evidence of significant pulmonary embolus. Diffuse cardiac enlargement. Reflux of contrast material into the IVC and hepatic veins consistent with passive congestion and right heart failure. No significant lymphadenopathy in the chest. Visualized mediastinal and axillary lymph nodes are prominent but without pathologic enlargement. These are likely reactive. Esophagus is decompressed. Evaluation of lungs is limited due to motion artifact but there appears to be diffuse perihilar airspace disease likely representing edema although pneumonia could also have this appearance. No pleural effusions. No pneumothorax.  Airways appear patent. The nodule demonstrated on previous chest x-ray from 10/09/2015 appears to correspond to callus from a left posterior rib fracture. No destructive bone lesions. CT ABDOMEN and PELVIS FINDINGS Cholelithiasis with mild gallbladder wall thickening. No bile duct dilatation. Liver, spleen, pancreas, adrenal glands, abdominal aorta, inferior vena cava, and retroperitoneal lymph nodes are unremarkable. There is mild diffuse abdominal and pelvic ascites. Kidneys are atrophic with heterogeneous delayed nephrograms consistent with renal insufficiency. The left renal mass seen previously is no longer present and may have been removed or resolved in the interval. No hydronephrosis in either kidney. Multiple sub cm renal cysts. Stomach, small bowel, and colon are not abnormally distended and no discrete wall thickening is appreciated. Contrast material flows through to the colon without evidence of bowel obstruction. No free air in the abdomen. Pelvis: Prostate gland is enlarged, measuring 4.2 cm diameter. Mild thickening of bladder wall may be due to hypertrophy or cystitis. Free fluid in the pelvis consistent with ascites. Fluid extends into a right inguinal hernia. No bowel herniation. The appendix is normal. No pelvic mass or lymphadenopathy. Small left inguinal hernia containing mostly fat. No destructive bone lesions. Review of the MIP images confirms the above findings. IMPRESSION: No evidence of significant pulmonary embolus. Cardiac enlargement with evidence of right heart failure. Bilateral perihilar airspace disease probably representing edema although pneumonia could also have this appearance. Prominent lymph nodes without pathologic enlargement, likely reactive. Cholelithiasis with mild gallbladder wall thickening. Cholecystitis not excluded. Diffuse abdominal and pelvic ascites. Atrophic kidneys with heterogeneous delayed nephrogram consistent with renal insufficiency. Prostate enlargement.  Electronically Signed   By: Lucienne Capers M.D.   On: 02/21/2016 02:05   Ct Abdomen Pelvis W Contrast  02/21/2016  CLINICAL DATA:  Sharp right mid back and frontal abdominal pain. Two days productive cough. Hemoptysis. EXAM: CT ANGIOGRAPHY CHEST CT ABDOMEN AND PELVIS WITH CONTRAST TECHNIQUE: Multidetector CT imaging of the chest was performed using the standard protocol during bolus administration of intravenous contrast. Multiplanar CT image reconstructions and MIPs were obtained to evaluate the vascular anatomy. Multidetector CT imaging of the abdomen and pelvis was performed using the standard protocol during bolus administration of intravenous contrast. CONTRAST:  130mL OMNIPAQUE IOHEXOL 350 MG/ML SOLN COMPARISON:  CT abdomen and pelvis 03/08/2014 FINDINGS: CTA CHEST FINDINGS Technically adequate study with good opacification of the central and segmental pulmonary arteries. No focal filling defects are demonstrated. No evidence of significant pulmonary embolus. Diffuse cardiac enlargement. Reflux of contrast material into the IVC and hepatic veins consistent with passive  congestion and right heart failure. No significant lymphadenopathy in the chest. Visualized mediastinal and axillary lymph nodes are prominent but without pathologic enlargement. These are likely reactive. Esophagus is decompressed. Evaluation of lungs is limited due to motion artifact but there appears to be diffuse perihilar airspace disease likely representing edema although pneumonia could also have this appearance. No pleural effusions. No pneumothorax. Airways appear patent. The nodule demonstrated on previous chest x-ray from 10/09/2015 appears to correspond to callus from a left posterior rib fracture. No destructive bone lesions. CT ABDOMEN and PELVIS FINDINGS Cholelithiasis with mild gallbladder wall thickening. No bile duct dilatation. Liver, spleen, pancreas, adrenal glands, abdominal aorta, inferior vena cava, and  retroperitoneal lymph nodes are unremarkable. There is mild diffuse abdominal and pelvic ascites. Kidneys are atrophic with heterogeneous delayed nephrograms consistent with renal insufficiency. The left renal mass seen previously is no longer present and may have been removed or resolved in the interval. No hydronephrosis in either kidney. Multiple sub cm renal cysts. Stomach, small bowel, and colon are not abnormally distended and no discrete wall thickening is appreciated. Contrast material flows through to the colon without evidence of bowel obstruction. No free air in the abdomen. Pelvis: Prostate gland is enlarged, measuring 4.2 cm diameter. Mild thickening of bladder wall may be due to hypertrophy or cystitis. Free fluid in the pelvis consistent with ascites. Fluid extends into a right inguinal hernia. No bowel herniation. The appendix is normal. No pelvic mass or lymphadenopathy. Small left inguinal hernia containing mostly fat. No destructive bone lesions. Review of the MIP images confirms the above findings. IMPRESSION: No evidence of significant pulmonary embolus. Cardiac enlargement with evidence of right heart failure. Bilateral perihilar airspace disease probably representing edema although pneumonia could also have this appearance. Prominent lymph nodes without pathologic enlargement, likely reactive. Cholelithiasis with mild gallbladder wall thickening. Cholecystitis not excluded. Diffuse abdominal and pelvic ascites. Atrophic kidneys with heterogeneous delayed nephrogram consistent with renal insufficiency. Prostate enlargement. Electronically Signed   By: Lucienne Capers M.D.   On: 02/21/2016 02:05   US Abdomen Limited Ruq  02/21/2016  CLINICAL DATA:  Right upper quadrant abdominal pain. EXAM: US ABDOMEN LIMITED - RIGHT UPPER QUADRANT COMPARISON:  02/21/2016 FINDINGS: Gallbladder: Gallbladder wall thickening at 4 mm. Flat 7 mm gallstone observed. Sonographic Murphy's sign absent. Common bile  duct: Diameter: 4 mm Liver: No focal lesion identified. Within normal limits in parenchymal echogenicity. Other: Perihepatic ascites as on prior images such as 02/21/2016. IMPRESSION: 1. Flat 7 mm gallstone. 2. Gallbladder wall thickening with perihepatic ascites as on the prior CT. Gallbladder wall thickening could be from inflammation although could also be from the patient's known hypoalbuminemia. Electronically Signed   By: Van Clines M.D.   On: 02/21/2016 13:52      ASSESSMENT AND PLAN:     Boyan Cheever is a 50 y.o. male with a history of HTN, ESRD on HD, polysubstance abuse including cocaine and alcohol abuse, chronic systolic and diastolic HF, medication non compliance, secondary hyperparathyroidism and anemia who brought to [facility] 3/8 with complaint of AMS/cough/shortness of breath and back pain. He had missed 2 prior hemodialysis session due to back pain. Workup in ED revealed mild pulmonary edema, cardiac enlargement, potassium of 7.1 and taken for emergent hemodialysis. He was started on Vancomycin and Cefepime in the ED. His mental status improved after dialysis.  1. Acute on chronic combined systolic and diastolic heart failure - Echo from 2013 with LV EF of 40-45% noted inability to  rule out left atrial mass and follow up TEE or cardiac MRI was recommended, which has not been performed. He continued to use polysubstance including cocaine, marijuana and tobacco. He also continued to drink alcohol. Noncompliance with medication and hemodialysis. - Echocardiogram during this admission 3/9 shows left ventricular function of 25-30%, mild LVH, grade 2 diastolic dysfunction, mild mitral regurgitation, she file dilated left atrium, mild dilated right ventricle with reduced systolic function, moderate pulmonic regurgitation, PA peak pressure of 55 mmHg and finding consistent with elevated central venous pressure.  - We will discuss plan with M.D. On coreg 25mg  BID, losartan  100mg , imdur 60mg .    2. NSVT - Review of telemetry shows multiple episode of nonsustained VT ranging from 3 beats to 21 beats. 21 beats 3/9 @ 1802. 13 beats at 3/10 @ 0734. Rate mostly stable in 60s to 70s. Intermittently goes to high 40s. The patient seems asymptomatic, unable to provide specific information. - EKG on admission shows normal sinus rhythm at rate of 79 bpm, prolonged PR interval and QTc of 555ms.   Vent. rate 79 BPM  PR interval 231 ms  QRS duration 118 ms  QT/QTc 449/515 ms  P-R-T axes 71 244 65 - Worsening of PR and QTC prolongation when compared to EKG of 02/26/2015.  Vent. rate 82 BPM  PR interval 191 ms  QRS duration 100 ms  QT/QTc 422/493 ms  P-R-T axes 60 -84 148 - Continue BB. Correct K.    Principal Problem:   ESRD (end stage renal disease) on dialysis [facility]) Active Problems:   Essential hypertension   Metabolic acidosis   End stage renal disease (HCC)   Hyperkalemia   Combined congestive systolic and diastolic heart failure (HCC)   Volume overload   Acute on chronic combined systolic and diastolic heart failure ([place])   Back pain   Signed: Anacristina Steffek, PA 02/22/2016, 10:31 AM Pager [phone]  Co-Sign MD

## 2016-02-22 NOTE — Discharge Summary (Signed)
Name: [name] [name] MRN: [id number] DOB: 04/20/1966 50 y.o. PCP: Pcp Not In System  Date of Admission: 02/20/2016  2:13 PM Date of Discharge: 02/26/2016 Attending Physician: Oval Linsey, MD  Discharge Diagnosis: 1. ESRD 2. Combined CHF 3. Back/Flank Pain   Principal Problem:   ESRD (end stage renal disease) on dialysis [facility]) Active Problems:   Essential hypertension   Metabolic acidosis   End stage renal disease (HCC)   Hyperkalemia   Combined congestive systolic and diastolic heart failure (HCC)   Volume overload   Acute on chronic combined systolic and diastolic heart failure (HCC)   Back pain   Right-sided chest pain   Ventricular tachycardia ([place])   Non-ischemic cardiomyopathy ([place])  Discharge Medications:   Medication List    TAKE these medications        calcitRIOL 0.5 MCG capsule  Commonly known as:  ROCALTROL  Take 1 capsule (0.5 mcg total) by mouth every Monday, Wednesday, and Friday with hemodialysis.     calcium acetate 667 MG capsule  Commonly known as:  PHOSLO  Take 3 capsules (2,001 mg total) by mouth 3 (three) times daily with meals.     carvedilol 12.5 MG tablet  Commonly known as:  COREG  Take 2 tablets (25 mg total) by mouth 2 (two) times daily with a meal.     dextromethorphan 30 MG/5ML liquid  Commonly known as:  DELSYM  Take 5 mLs (30 mg total) by mouth 2 (two) times daily as needed for cough.     diclofenac sodium 1 % Gel  Commonly known as:  VOLTAREN  Apply 2 g topically 4 (four) times daily -  before meals and at bedtime.     ferric gluconate 125 mg in sodium chloride 0.9 % 100 mL  Inject 125 mg into the vein every Monday, Wednesday, and Friday with hemodialysis.     isosorbide mononitrate 60 MG 24 hr tablet  Commonly known as:  IMDUR  Take 60 mg by mouth daily.     losartan 100 MG tablet  Commonly known as:  COZAAR  Take 1 tablet (100 mg total) by mouth daily.     multivitamin with minerals Tabs tablet  Take 1 tablet by  mouth daily as needed (Nutritional supplementation). Reported on 02/21/2016     rOPINIRole 0.5 MG tablet  Commonly known as:  REQUIP  Take 0.5 mg by mouth 2 (two) times daily. 0.5mg  at bedtime and 0.5mg  at dialysis        Disposition and follow-up:   [name] was discharged from [facility] in Stable condition.  At the hospital follow up visit please address:  1.  Medication and dialysis adherence, Cardiology follow up  2.  Labs / imaging needed at time of follow-up: none  3.  Pending labs/ test needing follow-up: none  Follow-up Appointments: Follow-up Information    Follow up with [facility]. Schedule an appointment as soon as possible for a visit in 2 weeks.   Why:  establish care   Contact information:   [address] [place] [phone]      Discharge Instructions: Discharge Instructions    Call MD for:  difficulty breathing, headache or visual disturbances    Complete by:  As directed      Call MD for:  extreme fatigue    Complete by:  As directed      Call MD for:  persistant dizziness or light-headedness  Complete by:  As directed      Call MD for:  persistant nausea and vomiting    Complete by:  As directed      Call MD for:  redness, tenderness, or signs of infection (pain, swelling, redness, odor or green/yellow discharge around incision site)    Complete by:  As directed      Call MD for:  temperature >100.4    Complete by:  As directed      Diet - low sodium heart healthy    Complete by:  As directed      Increase activity slowly    Complete by:  As directed            Consultations: Treatment Team:  Roney Jaffe, MD  Procedures Performed:  Dg Chest 2 View  02/20/2016  CLINICAL DATA:  Generalized weakness, back pain, shortness of breath, missed appointment for dialysis EXAM: CHEST  2 VIEW COMPARISON:  10/09/2015 FINDINGS: Stable moderate cardiac  enlargement. Central vascular congestion. Mild hazy attenuation left perihilar region. Known posterior lateral mid left rib fracture. No pleural effusion. IMPRESSION: Cardiac enlargement with vascular congestion. Although the right lung is clear there is patchy left perihilar infiltrate. Possibilities include mild asymmetric pulmonary edema or infectious infiltrate. Electronically Signed   By: Skipper Cliche M.D.   On: 02/20/2016 14:24   Dg Ribs Unilateral Right  02/22/2016  CLINICAL DATA:  Right anterior to lateral rib pain. EXAM: RIGHT RIBS - 2 VIEW COMPARISON:  None. FINDINGS: No fracture or other bone lesions are seen involving the ribs. IMPRESSION: Negative. Electronically Signed   By: Kerby Moors M.D.   On: 02/22/2016 11:45   Ct Angio Chest Pe W/cm &/or Wo Cm  02/21/2016  CLINICAL DATA:  Sharp right mid back and frontal abdominal pain. Two days productive cough. Hemoptysis. EXAM: CT ANGIOGRAPHY CHEST CT ABDOMEN AND PELVIS WITH CONTRAST TECHNIQUE: Multidetector CT imaging of the chest was performed using the standard protocol during bolus administration of intravenous contrast. Multiplanar CT image reconstructions and MIPs were obtained to evaluate the vascular anatomy. Multidetector CT imaging of the abdomen and pelvis was performed using the standard protocol during bolus administration of intravenous contrast. CONTRAST:  161mL OMNIPAQUE IOHEXOL 350 MG/ML SOLN COMPARISON:  CT abdomen and pelvis 03/08/2014 FINDINGS: CTA CHEST FINDINGS Technically adequate study with good opacification of the central and segmental pulmonary arteries. No focal filling defects are demonstrated. No evidence of significant pulmonary embolus. Diffuse cardiac enlargement. Reflux of contrast material into the IVC and hepatic veins consistent with passive congestion and right heart failure. No significant lymphadenopathy in the chest. Visualized mediastinal and axillary lymph nodes are prominent but without pathologic  enlargement. These are likely reactive. Esophagus is decompressed. Evaluation of lungs is limited due to motion artifact but there appears to be diffuse perihilar airspace disease likely representing edema although pneumonia could also have this appearance. No pleural effusions. No pneumothorax. Airways appear patent. The nodule demonstrated on previous chest x-ray from 10/09/2015 appears to correspond to callus from a left posterior rib fracture. No destructive bone lesions. CT ABDOMEN and PELVIS FINDINGS Cholelithiasis with mild gallbladder wall thickening. No bile duct dilatation. Liver, spleen, pancreas, adrenal glands, abdominal aorta, inferior vena cava, and retroperitoneal lymph nodes are unremarkable. There is mild diffuse abdominal and pelvic ascites. Kidneys are atrophic with heterogeneous delayed nephrograms consistent with renal insufficiency. The left renal mass seen previously is no longer present and may have been removed or resolved in the interval. No hydronephrosis in  either kidney. Multiple sub cm renal cysts. Stomach, small bowel, and colon are not abnormally distended and no discrete wall thickening is appreciated. Contrast material flows through to the colon without evidence of bowel obstruction. No free air in the abdomen. Pelvis: Prostate gland is enlarged, measuring 4.2 cm diameter. Mild thickening of bladder wall may be due to hypertrophy or cystitis. Free fluid in the pelvis consistent with ascites. Fluid extends into a right inguinal hernia. No bowel herniation. The appendix is normal. No pelvic mass or lymphadenopathy. Small left inguinal hernia containing mostly fat. No destructive bone lesions. Review of the MIP images confirms the above findings. IMPRESSION: No evidence of significant pulmonary embolus. Cardiac enlargement with evidence of right heart failure. Bilateral perihilar airspace disease probably representing edema although pneumonia could also have this appearance. Prominent  lymph nodes without pathologic enlargement, likely reactive. Cholelithiasis with mild gallbladder wall thickening. Cholecystitis not excluded. Diffuse abdominal and pelvic ascites. Atrophic kidneys with heterogeneous delayed nephrogram consistent with renal insufficiency. Prostate enlargement. Electronically Signed   By: Lucienne Capers M.D.   On: 02/21/2016 02:05   Ct Abdomen Pelvis W Contrast  02/21/2016  CLINICAL DATA:  Sharp right mid back and frontal abdominal pain. Two days productive cough. Hemoptysis. EXAM: CT ANGIOGRAPHY CHEST CT ABDOMEN AND PELVIS WITH CONTRAST TECHNIQUE: Multidetector CT imaging of the chest was performed using the standard protocol during bolus administration of intravenous contrast. Multiplanar CT image reconstructions and MIPs were obtained to evaluate the vascular anatomy. Multidetector CT imaging of the abdomen and pelvis was performed using the standard protocol during bolus administration of intravenous contrast. CONTRAST:  164mL OMNIPAQUE IOHEXOL 350 MG/ML SOLN COMPARISON:  CT abdomen and pelvis 03/08/2014 FINDINGS: CTA CHEST FINDINGS Technically adequate study with good opacification of the central and segmental pulmonary arteries. No focal filling defects are demonstrated. No evidence of significant pulmonary embolus. Diffuse cardiac enlargement. Reflux of contrast material into the IVC and hepatic veins consistent with passive congestion and right heart failure. No significant lymphadenopathy in the chest. Visualized mediastinal and axillary lymph nodes are prominent but without pathologic enlargement. These are likely reactive. Esophagus is decompressed. Evaluation of lungs is limited due to motion artifact but there appears to be diffuse perihilar airspace disease likely representing edema although pneumonia could also have this appearance. No pleural effusions. No pneumothorax. Airways appear patent. The nodule demonstrated on previous chest x-ray from 10/09/2015 appears  to correspond to callus from a left posterior rib fracture. No destructive bone lesions. CT ABDOMEN and PELVIS FINDINGS Cholelithiasis with mild gallbladder wall thickening. No bile duct dilatation. Liver, spleen, pancreas, adrenal glands, abdominal aorta, inferior vena cava, and retroperitoneal lymph nodes are unremarkable. There is mild diffuse abdominal and pelvic ascites. Kidneys are atrophic with heterogeneous delayed nephrograms consistent with renal insufficiency. The left renal mass seen previously is no longer present and may have been removed or resolved in the interval. No hydronephrosis in either kidney. Multiple sub cm renal cysts. Stomach, small bowel, and colon are not abnormally distended and no discrete wall thickening is appreciated. Contrast material flows through to the colon without evidence of bowel obstruction. No free air in the abdomen. Pelvis: Prostate gland is enlarged, measuring 4.2 cm diameter. Mild thickening of bladder wall may be due to hypertrophy or cystitis. Free fluid in the pelvis consistent with ascites. Fluid extends into a right inguinal hernia. No bowel herniation. The appendix is normal. No pelvic mass or lymphadenopathy. Small left inguinal hernia containing mostly fat. No destructive bone lesions. Review  of the MIP images confirms the above findings. IMPRESSION: No evidence of significant pulmonary embolus. Cardiac enlargement with evidence of right heart failure. Bilateral perihilar airspace disease probably representing edema although pneumonia could also have this appearance. Prominent lymph nodes without pathologic enlargement, likely reactive. Cholelithiasis with mild gallbladder wall thickening. Cholecystitis not excluded. Diffuse abdominal and pelvic ascites. Atrophic kidneys with heterogeneous delayed nephrogram consistent with renal insufficiency. Prostate enlargement. Electronically Signed   By: Lucienne Capers M.D.   On: 02/21/2016 02:05   US Abdomen Limited  Ruq  02/21/2016  CLINICAL DATA:  Right upper quadrant abdominal pain. EXAM: US ABDOMEN LIMITED - RIGHT UPPER QUADRANT COMPARISON:  02/21/2016 FINDINGS: Gallbladder: Gallbladder wall thickening at 4 mm. Flat 7 mm gallstone observed. Sonographic Murphy's sign absent. Common bile duct: Diameter: 4 mm Liver: No focal lesion identified. Within normal limits in parenchymal echogenicity. Other: Perihepatic ascites as on prior images such as 02/21/2016. IMPRESSION: 1. Flat 7 mm gallstone. 2. Gallbladder wall thickening with perihepatic ascites as on the prior CT. Gallbladder wall thickening could be from inflammation although could also be from the patient's known hypoalbuminemia. Electronically Signed   By: Van Clines M.D.   On: 02/21/2016 13:52    2D Echo: Study Conclusions  - Left ventricle: The cavity size was normal. Wall thickness was  increased in a pattern of mild LVH. There was mild concentric  hypertrophy. Systolic function was severely reduced. The  estimated ejection fraction was in the range of 25% to 30%.  Diffuse hypokinesis. Features are consistent with a pseudonormal  left ventricular filling pattern, with concomitant abnormal  relaxation and increased filling pressure (grade 2 diastolic  dysfunction). Doppler parameters are consistent with high  ventricular filling pressure. - Aortic valve: Transvalvular velocity was within the normal range.  There was no stenosis. There was no regurgitation. - Mitral valve: There was mild regurgitation. - Left atrium: The atrium was severely dilated. - Right ventricle: The cavity size was mildly dilated. Wall  thickness was normal. Systolic function was moderately reduced. - Right atrium: The atrium was severely dilated. - Atrial septum: No defect or patent foramen ovale was identified  by color flow Doppler. - Tricuspid valve: There was mild regurgitation. - Pulmonic valve: There was moderate regurgitation. - Pulmonary  arteries: Systolic pressure was severely increased. PA  peak pressure: 55 mm Hg (S). - Inferior vena cava: The vessel was dilated. The respirophasic  diameter changes were blunted (< 50%), consistent with elevated  central venous pressure.  Cardiac Cath:  PCWP: 45 mmHg Conclusion    1. No angiographic evidence of CAD 2. Moderate pulmonary HTN 3. Elevated LV filling pressures.   Recommendations: Would consider aggressive volume removal with HD.     Admission HPI: Mr. Iha is a 50 yo male with ESRD (MWF), combined CHF (EF [id number]; grade 2 diastolic dysfunction in 0000000), HTN, and polysubstance abuse, presenting with unclear history of back pain, hemoptysis, and missed dialysis. History was difficult to obtain as patient was somnolent, falling asleep quickly between questions. Patient reports he has not had dialysis since last Wednesday (3/1) 2/2 back pain. However, he states he has only had back pain for the last 2 days. The pain is sharp, right mid back, radiating to right frontal abdomen. Nothing makes the pain better or worse. It is not a/w eating. He denies hematuria or dysuria. MRI in May 2015 demonstrated normal pancreas, gallbladder thickening, left renal lesion c/f solid renal neoplasm, right renal lesion c/f benign hemorrhagic cyst but possibly  solid renal neoplasm, and hemosiderosis of the liver and spleen. HIDA scan in May 2015 was negative for cholecystitis. He had an ultrasound in August 2015 demonstrating cholelithiasis c/f chronic cholecystitis and possible right kidney neoplasm.  Patient also complains of cough for the last 2 days with yellow and bloody sputum. He denies fever, chills, muscle aches, or sick contacts. CXR in Oct 2016 noted vague nodular density in left midlung. CT chest recommended at that time has not been performed.  He is a current tobacco user, but states "not that often" for a "not sure" number of years. He drinks 2-3 beers and 2-3 airplane  bottles of liquor every couple days. He last used cocaine 2 days ago. He denies IVDU.  He has a h/o of combined CHF. Echo from 2013 noted inability to rule out left atrial mass and follow up TEE or cardiac MRI was recommended, which has not been performed.  FHx: "everyone" has HTN. He denies family h/o DMII or kidney disease.  In the ED, he was noted to be somnolent, with BUN elevated to 120, lactate 3.7 and potassium of 7.1 without ECG changes. CXR noted cardiac enlargement, vascular congestion, and patchy left perihilar infiltrate c/f asymmetric edema vs infectious infiltrate. He was started on Vancomycin and Cefepime in the ED.    Hospital Course by problem list: Principal Problem:   ESRD (end stage renal disease) on dialysis [facility]) Active Problems:   Essential hypertension   Metabolic acidosis   End stage renal disease (HCC)   Hyperkalemia   Combined congestive systolic and diastolic heart failure (HCC)   Volume overload   Acute on chronic combined systolic and diastolic heart failure (HCC)   Back pain   Right-sided chest pain   Ventricular tachycardia ([place])   Non-ischemic cardiomyopathy ([place])   ESRD: Patient presented somnolent, with potassium 7.1 without ECG changes after missing 2 dialysis sessions.  He was given emergent dialysis and repeat dialysis on the next two consecutive days.  His somnolence improved and he was cleared to return to his outpatient dialysis schedule.  Combined CHF and NSVT: Patient presented with volume overload 2/2 missed dialysis sessions.  Repeat echocardiogram showed reduced EF of 25-30% and grade 2 diastolic dysfunction.  During hospitalization, he had repeat episodes of asymptomatic nonsustained VTach. Cardiology evaluated patient and did not recommend ICD placement.  Right and left heart catheterizations demonstrated elevated PCWP/LVEDP without evidence of coronary ischemia.  His CHF is therefore believed to be non-ischemic in the setting of  longstanding cocaine abuse.  Patient understands implication of cocaine use and need to abstain.  At follow up, please address whether patient needs continued Cardiology follow up and cocaine abstinence.  Back/Flank Pain: Previous imaging by CT scan and abdominal US showed concern for renal neoplasms and chronic cholecystitis.  Repeat CT was negative for concern for renal neoplasm.  RUQ US showed continued gallbladder stone without obstruction.  Rib Xrays were negative.  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  Pulmonary Density: Previous CXR demonstrated left density.  Follow up CT chest showed callus of previous rib fracture.    Discharge Vitals:   BP 116/81 mmHg  Pulse 66  Temp(Src) 98.6 F (37 C) (Oral)  Resp 16  Ht 5\' 9"  (1.753 m)  Wt 173 lb 15.1 oz (78.9 kg)  BMI 25.68 kg/m2  SpO2 99%  Discharge Labs:  Results for orders placed or performed during the hospital encounter of 02/20/16 (from the past 24 hour(s))  CBC     Status: Abnormal   Collection Time: 02/25/16  2:01 PM  Result Value Ref Range   WBC 4.6 4.0 - 10.5 K/uL   RBC 3.38 (L) 4.22 - 5.81 MIL/uL   Hemoglobin 10.5 (L) 13.0 - 17.0 g/dL   HCT 32.3 (L) 39.0 - 52.0 %   MCV 95.6 78.0 - 100.0 fL   MCH 31.1 26.0 - 34.0 pg   MCHC 32.5 30.0 - 36.0 g/dL   RDW 17.5 (H) 11.5 - 15.5 %   Platelets 152 150 - 400 K/uL  Renal function panel     Status: Abnormal   Collection Time: 02/25/16  2:01 PM  Result Value Ref Range   Sodium 136 135 - 145 mmol/L   Potassium 4.1 3.5 - 5.1 mmol/L   Chloride 98 (L) 101 - 111 mmol/L   CO2 21 (L) 22 - 32 mmol/L   Glucose, Bld 130 (H) 65 - 99 mg/dL   BUN 61 (H) 6 - 20 mg/dL   Creatinine, Ser 10.74 (H) 0.61 - 1.24 mg/dL   Calcium 8.7 (L) 8.9 - 10.3 mg/dL   Phosphorus 5.8 (H) 2.5 - 4.6 mg/dL   Albumin 3.0 (L) 3.5 - 5.0 g/dL   GFR calc non Af Amer 5 (L) >60 mL/min   GFR calc Af Amer 6 (L) >60 mL/min   Anion gap 17 (H) 5 - 15  CBC     Status:  Abnormal   Collection Time: 02/26/16  5:42 AM  Result Value Ref Range   WBC 4.7 4.0 - 10.5 K/uL   RBC 3.47 (L) 4.22 - 5.81 MIL/uL   Hemoglobin 10.8 (L) 13.0 - 17.0 g/dL   HCT 33.7 (L) 39.0 - 52.0 %   MCV 97.1 78.0 - 100.0 fL   MCH 31.1 26.0 - 34.0 pg   MCHC 32.0 30.0 - 36.0 g/dL   RDW 17.7 (H) 11.5 - 15.5 %   Platelets 140 (L) 150 - 400 K/uL  Basic metabolic panel     Status: Abnormal   Collection Time: 02/26/16  5:42 AM  Result Value Ref Range   Sodium 140 135 - 145 mmol/L   Potassium 4.0 3.5 - 5.1 mmol/L   Chloride 98 (L) 101 - 111 mmol/L   CO2 26 22 - 32 mmol/L   Glucose, Bld 87 65 - 99 mg/dL   BUN 30 (H) 6 - 20 mg/dL   Creatinine, Ser 6.74 (H) 0.61 - 1.24 mg/dL   Calcium 8.7 (L) 8.9 - 10.3 mg/dL   GFR calc non Af Amer 9 (L) >60 mL/min   GFR calc Af Amer 10 (L) >60 mL/min   Anion gap 16 (H) 5 - 15    Signed: Iline Oven, MD 02/26/2016, 1:00 PM    Services Ordered on Discharge: none Equipment Ordered on Discharge: none

## 2016-02-22 NOTE — Progress Notes (Signed)
Utilization review completed. Carney Saxton, RN, BSN. 

## 2016-02-22 NOTE — Consult Note (Signed)
ELECTROPHYSIOLOGY CONSULT NOTE    Patient ID: [name] [name] MRN: [id number], DOB/AGE: August 22, 1966 50 y.o.  Admit date: 02/20/2016 Date of Consult: 02/22/2016  Primary Physician: Pcp Not In System Primary Cardiologist:   Reason for Consultation: VT  HPI: [name] [name] is a 50 y.o. male a history of end-stage renal disease requiring hemodialysis, chronic systolic and diastolic heart failure, hypertension, and polysubstance abuse including cocaine and alcohol who presents with a several day history of right-sided back pain, shortness of breath, and bloody sputum. He missed hemodialysis for 1 week secondary to back pain per his report. He has had a recent upper respiratory infection and states the bloody sputum is associated with some mucus as well. He presented to the emergency department with the above complaints and was found to have a potassium of 7.1 and was taken to emergent dialysis and admitted.    Cardiology was consulted to the case with observed episodes of NSVT and noted hx of CM without an ischemic w/u and is planned for cardiac cath on Monday to further evaluate this.  EP is being asked to evaluate VT noting prolonged QT as well.  He is feeling well today outside of his back pain.  He denies any kind of CP, palpitations,.  No h/o near syncope or syncope, no family hx of sudden death.   The patient's presenting labs  02/20/16: K+ 7.1 >> 4.6, BUN/Creat 120/18.43 02/21/16: Mag 2.3 02/22/16: K+ 3.4, BUN/Creat 32/8.55  He continues to use ETOH and cocaine ETOH he reports 3 beers week, historically a heavy drinker Last cocaine use 6-7 days ago Marijuana: daily No tox screen done  02/21/16: Echocardiogram Study Conclusions - Left ventricle: The cavity size was normal. Wall thickness was  increased in a pattern of mild LVH. There was mild concentric  hypertrophy. Systolic function was severely reduced. The  estimated ejection fraction was in the range of 25% to 30%.  Diffuse  hypokinesis. Features are consistent with a pseudonormal  left ventricular filling pattern, with concomitant abnormal  relaxation and increased filling pressure (grade 2 diastolic  dysfunction). Doppler parameters are consistent with high  ventricular filling pressure. - Aortic valve: Transvalvular velocity was within the normal range.  There was no stenosis. There was no regurgitation. - Mitral valve: There was mild regurgitation. - Left atrium: The atrium was severely dilated. - Right ventricle: The cavity size was mildly dilated. Wall  thickness was normal. Systolic function was moderately reduced. - Right atrium: The atrium was severely dilated. - Atrial septum: No defect or patent foramen ovale was identified  by color flow Doppler. - Tricuspid valve: There was mild regurgitation. - Pulmonic valve: There was moderate regurgitation. - Pulmonary arteries: Systolic pressure was severely increased. PA  peak pressure: 55 mm Hg (S). - Inferior vena cava: The vessel was dilated. The respirophasic  diameter changes were blunted (< 50%), consistent with elevated  central venous pressure.  07/16/12: EF 40-45% 10/21/10: EF 30-35%   Past Medical History  Diagnosis Date  . Hypertension   . CKD (chronic kidney disease) stage 4, GFR 15-29 ml/min (HCC)   . Systolic CHF (HCC)     Ef 30-35%  . Medical non-compliance   . Polysubstance abuse   . ESRD (end stage renal disease) ([place])   . Ventricular tachycardia ([place]) 02/22/2016     Surgical History:  Past Surgical History  Procedure Laterality Date  . Insertion of dialysis catheter  07/17/2012    Procedure: INSERTION OF DIALYSIS CATHETER;  Surgeon: Jannette Fogo  Bridgett Larsson, MD;  Location: [place];  Service: Vascular;  Laterality: Right;  right internal jugular  . Bascilic vein transposition  01/19/2013    Procedure: BASCILIC VEIN TRANSPOSITION;  Surgeon: Conrad Ranger, MD;  Location: [place];  Service: Vascular;  Laterality: Left;  left 2nd stage  basilic vein transposition  . Venogram N/A 08/09/2012    Procedure: VENOGRAM;  Surgeon: Conrad Waverly, MD;  Location: [facility];  Service: Cardiovascular;  Laterality: N/A;     Prescriptions prior to admission  Medication Sig Dispense Refill Last Dose  . calcium acetate (PHOSLO) 667 MG capsule Take 3 capsules (2,001 mg total) by mouth 3 (three) times daily with meals. 90 capsule 0 02/18/2016  . carvedilol (COREG) 12.5 MG tablet Take 2 tablets (25 mg total) by mouth 2 (two) times daily with a meal. 30 tablet 2 02/17/2016 at 1800  . isosorbide mononitrate (IMDUR) 60 MG 24 hr tablet Take 60 mg by mouth daily.   02/18/2016  . Multiple Vitamin (MULTIVITAMIN WITH MINERALS) TABS tablet Take 1 tablet by mouth daily as needed (Nutritional supplementation). Reported on 02/21/2016   02/18/2016  . rOPINIRole (REQUIP) 0.5 MG tablet Take 0.5 mg by mouth 2 (two) times daily. 0.5mg  at bedtime and 0.5mg  at dialysis       Inpatient Medications:  . calcitRIOL      . calcitRIOL  0.5 mcg Oral Q M,W,F-HD  . calcium acetate  2,001 mg Oral TID WC  . carvedilol  25 mg Oral BID WC  . diclofenac sodium  2 g Topical TID AC & HS  . ferric gluconate (FERRLECIT/NULECIT) IV  125 mg Intravenous Q M,W,F-HD  . heparin  4,000 Units Dialysis Once in dialysis  . heparin  5,000 Units Subcutaneous 3 times per day  . hydrALAZINE  10 mg Oral 3 times per day  . insulin aspart  10 Units Intravenous Once  . isosorbide mononitrate  60 mg Oral Daily  . losartan  100 mg Oral Daily  . pantoprazole  40 mg Oral Daily  . sodium chloride flush  3 mL Intravenous Q12H    Allergies: No Known Allergies  Social History   Social History  . Marital Status: Single    Spouse Name: N/A  . Number of Children: N/A  . Years of Education: N/A   Occupational History  . Not on file.   Social History Main Topics  . Smoking status: Current Every Day Smoker -- 0.25 packs/day    Types: Cigarettes    Last Attempt to Quit: 08/09/2012  . Smokeless  tobacco: Not on file     Comment: Trying to quit; 3 cigs/last 2 weeks  . Alcohol Use: 4.2 oz/week    7 Cans of beer per week     Comment: Patient not forthcoming.  2-3 beers per week  . Drug Use: No     Comment: Marijuana  . Sexual Activity: Not Currently   Other Topics Concern  . Not on file   Social History Narrative     Family History  Problem Relation Age of Onset  . Hypertension Mother   . Diabetes Mother   . Hypertension Father      Review of Systems: All other systems reviewed and are otherwise negative except as noted above.  Physical Exam: Filed Vitals:   02/22/16 0900 02/22/16 0930 02/22/16 1000 02/22/16 1145  BP: 140/102 139/90 134/96 130/84  Pulse: 81 81 75 81  Temp:   97.5 F (36.4 C) 97.2 F (36.2  C)  TempSrc:   Oral Oral  Resp:   15 16  Height:      Weight:   161 lb 13.1 oz (73.4 kg)   SpO2:   96% 97%    GEN- The patient appears older then his age, is in NAD, alert and oriented x 3 today.   HEENT: normocephalic, atraumatic; sclera clear, conjunctiva pink; hearing intact; oropharynx clear; neck supple, no JVP Lymph- no cervical lymphadenopathy Lungs- Clear to ausculation bilaterally, normal work of breathing.  No wheezes, rales, rhonchi Heart- Regular rate and rhythm, no significant murmurs, rubs or gallops, PMI not laterally displaced GI- soft, non-tender, somewhat distended Extremities- no clubbing, cyanosis, or edema; DP/PT/radial pulses 2+ bilaterally, LUE AVF MS- no significant deformity or atrophy Skin- warm and dry, no rash or lesion Psych- euthymic mood, full affect Neuro- no gross deficits observed  Labs:   Lab Results  Component Value Date   WBC 4.2 02/22/2016   HGB 10.2* 02/22/2016   HCT 31.0* 02/22/2016   MCV 93.9 02/22/2016   PLT 146* 02/22/2016    Recent Labs Lab 02/21/16 0512 02/22/16 0706  NA 135 134*  K 4.6 3.4*  CL 90* 95*  CO2 20* 25  BUN 58* 32*  CREATININE 11.46* 8.55*  CALCIUM 8.5* 8.4*  PROT 7.3  --     BILITOT 2.0*  --   ALKPHOS 83  --   ALT 13*  --   AST 19  --   GLUCOSE 82 101*      Radiology/Studies:  Dg Chest 2 View 02/20/2016  CLINICAL DATA:  Generalized weakness, back pain, shortness of breath, missed appointment for dialysis EXAM: CHEST  2 VIEW COMPARISON:  10/09/2015 FINDINGS: Stable moderate cardiac enlargement. Central vascular congestion. Mild hazy attenuation left perihilar region. Known posterior lateral mid left rib fracture. No pleural effusion. IMPRESSION: Cardiac enlargement with vascular congestion. Although the right lung is clear there is patchy left perihilar infiltrate. Possibilities include mild asymmetric pulmonary edema or infectious infiltrate. Electronically Signed   By: Skipper Cliche M.D.   On: 02/20/2016 14:24   Dg Ribs Unilateral Right 02/22/2016  CLINICAL DATA:  Right anterior to lateral rib pain. EXAM: RIGHT RIBS - 2 VIEW COMPARISON:  None. FINDINGS: No fracture or other bone lesions are seen involving the ribs. IMPRESSION: Negative. Electronically Signed   By: Kerby Moors M.D.   On: 02/22/2016 11:45   Ct Angio Chest Pe W/cm &/or Wo Cm 02/21/2016  CLINICAL DATA:  Sharp right mid back and frontal abdominal pain. Two days productive cough. Hemoptysis. EXAM: CT ANGIOGRAPHY CHEST CT ABDOMEN AND PELVIS WITH CONTRAST TECHNIQUE: Multidetector CT imaging of the chest was performed using the standard protocol during bolus administration of intravenous contrast. Multiplanar CT image reconstructions and MIPs were obtained to evaluate the vascular anatomy. Multidetector CT imaging of the abdomen and pelvis was performed using the standard protocol during bolus administration of intravenous contrast. CONTRAST:  176mL OMNIPAQUE IOHEXOL 350 MG/ML SOLN COMPARISON:  CT abdomen and pelvis 03/08/2014 FINDINGS: CTA CHEST FINDINGS Technically adequate study with good opacification of the central and segmental pulmonary arteries. No focal filling defects are demonstrated. No evidence of  significant pulmonary embolus. Diffuse cardiac enlargement. Reflux of contrast material into the IVC and hepatic veins consistent with passive congestion and right heart failure. No significant lymphadenopathy in the chest. Visualized mediastinal and axillary lymph nodes are prominent but without pathologic enlargement. These are likely reactive. Esophagus is decompressed. Evaluation of lungs is limited due to motion artifact  but there appears to be diffuse perihilar airspace disease likely representing edema although pneumonia could also have this appearance. No pleural effusions. No pneumothorax. Airways appear patent. The nodule demonstrated on previous chest x-ray from 10/09/2015 appears to correspond to callus from a left posterior rib fracture. No destructive bone lesions. CT ABDOMEN and PELVIS FINDINGS Cholelithiasis with mild gallbladder wall thickening. No bile duct dilatation. Liver, spleen, pancreas, adrenal glands, abdominal aorta, inferior vena cava, and retroperitoneal lymph nodes are unremarkable. There is mild diffuse abdominal and pelvic ascites. Kidneys are atrophic with heterogeneous delayed nephrograms consistent with renal insufficiency. The left renal mass seen previously is no longer present and may have been removed or resolved in the interval. No hydronephrosis in either kidney. Multiple sub cm renal cysts. Stomach, small bowel, and colon are not abnormally distended and no discrete wall thickening is appreciated. Contrast material flows through to the colon without evidence of bowel obstruction. No free air in the abdomen. Pelvis: Prostate gland is enlarged, measuring 4.2 cm diameter. Mild thickening of bladder wall may be due to hypertrophy or cystitis. Free fluid in the pelvis consistent with ascites. Fluid extends into a right inguinal hernia. No bowel herniation. The appendix is normal. No pelvic mass or lymphadenopathy. Small left inguinal hernia containing mostly fat. No destructive  bone lesions. Review of the MIP images confirms the above findings. IMPRESSION: No evidence of significant pulmonary embolus. Cardiac enlargement with evidence of right heart failure. Bilateral perihilar airspace disease probably representing edema although pneumonia could also have this appearance. Prominent lymph nodes without pathologic enlargement, likely reactive. Cholelithiasis with mild gallbladder wall thickening. Cholecystitis not excluded. Diffuse abdominal and pelvic ascites. Atrophic kidneys with heterogeneous delayed nephrogram consistent with renal insufficiency. Prostate enlargement. Electronically Signed   By: Lucienne Capers M.D.   On: 02/21/2016 02:05    US Abdomen Limited Ruq 02/21/2016  CLINICAL DATA:  Right upper quadrant abdominal pain. EXAM: US ABDOMEN LIMITED - RIGHT UPPER QUADRANT COMPARISON:  02/21/2016 FINDINGS: Gallbladder: Gallbladder wall thickening at 4 mm. Flat 7 mm gallstone observed. Sonographic Murphy's sign absent. Common bile duct: Diameter: 4 mm Liver: No focal lesion identified. Within normal limits in parenchymal echogenicity. Other: Perihepatic ascites as on prior images such as 02/21/2016. IMPRESSION: 1. Flat 7 mm gallstone. 2. Gallbladder wall thickening with perihepatic ascites as on the prior CT. Gallbladder wall thickening could be from inflammation although could also be from the patient's known hypoalbuminemia. Electronically Signed   By: Van Clines M.D.   On: 02/21/2016 13:52    EKG: Today is SR, QTc is 437ms 02/20/16: SR, QTc 451ms  TELEMETRY: SR, NSVT episodes, longest 22 beats   Assessment and Plan:   1. CHF/fluid OL     Missed 2 sessions of HD out patient, has had 3 here      Severe hyperkalemia is resolved, now hypokalemic -- deferred to nephrology  2. NSVT     Hyperkalemia, QTc reported to be 522ms, appears less then that, todays EKG as well.  3.CM dating back years     no history of an ischemic w/u, the patient states he has never  seen a cardiologist     Could be multifactorial with ESRF, multi-substance abuse, uncontrolled HTN for years leading to renal failure     Agree with cardiac cath given NSVT     Continue BB  4. Back pain     Felt to be MS by IM       Signed, Tommye Standard, PA-C 02/22/2016 2:43  PM  Patient history reviewed. The patient was examined. He was falling asleep during my interview.  Nonsustained ventricular tachycardia  Borderline QT prolongation  Cardiomyopathy ejection fraction 25-35%; severe left atrial enlargement (53/2.7/44) and pulmonary hypertension  End-stage renal disease on dialysis with challenges with compliance  Polysubstance abuse   The patient has a cardiomyopathy of unclear cause. I concur with recommendation for catheterization. Nonsustained ventricular tachycardia has prognostic implications in the context of cardiomyopathy but it differs as to whether the cardiomyopathy is ischemic or not. Total mortality is increased  ischemic cohort while sudden death but not total mortality is associated with nonischemic cohort.  QT prolongation and nonsustained ventricular tachycardia are not likely related as when they are we typically see long-and short coupling as well as polymorphic ventricular tachycardia.  Cardiomyopathy in itself can be associated with  QT prolongation.  The issue raised by Dr. Eppie Gibson regarding ICD is a very complicated issue in patients with dialysis and even more so in a patient with polysubstance drug abuse and, if I may be Bold, the issues further raised by intermittent noncompliance.  For now medical therapy for his cardiomyopathy would be most appropriate with hydralazine nitrates as well as beta blockers.  Call for further questions

## 2016-02-22 NOTE — Progress Notes (Signed)
Marland Kitchen  [facility] Progress Note  Assessment: 1. Hyperkalemia - resolved 2. ESRD - MWF - lower dry wt 3. Anemia - hgb 11.9 no ESA 4. Secondary hyperparathyroidism -  Resume calcitriol 5. HTN/volume - lowering dry wt 6. Nutrition - renal diet/vit 7. Back pain - back still hurts - no abdominal pain - had neg CT angio and abd CT yesterday 8. Noncompliance with HD - attendance variable - often signs off early 9. Polysubstance abuse - may be contributory to HTN 10. Hx systolic CHF  Plan - ok for discharge after HD today     Kelly Splinter MD [place] pager [phone]    cell [phone] 02/22/2016, 9:05 AM   Subjective:   Tells me he missed HD due to back pain. Breathing better  Objective Filed Vitals:   02/22/16 0651 02/22/16 0700 02/22/16 0730 02/22/16 0800  BP: 129/92 133/86 124/87 128/82  Pulse: 72 71 72 72  Temp:      TempSrc:      Resp:      Height:      Weight:      SpO2:       Physical Exam General: NAD Heart: RRR Lungs: crackles at bases, exp wheezes Abdomen:  Soft NT Extremities:  No edema Dialysis Access: AVF  Dialysis Orders: [facility] M[facility] 4h 400/A1.5 77kg AVF Heparin 5000 calcitriol 0.5 - venofer through 3/20 no ESA  Additional Objective Labs: Basic Metabolic Panel:  Recent Labs Lab 02/20/16 1346 02/20/16 1623 02/21/16 0512 02/22/16 0706  NA 137  --  135 134*  K 7.1*  --  4.6 3.4*  CL 90*  --  90* 95*  CO2 16*  --  20* 25  GLUCOSE 63* 131* 82 101*  BUN 120*  --  58* 32*  CREATININE 18.43*  --  11.46* 8.55*  CALCIUM 8.9  --  8.5* 8.4*  PHOS  --   --  7.7* 4.2   Liver Function Tests:  Recent Labs Lab 02/20/16 1346 02/21/16 0512 02/22/16 0706  AST 22 19  --   ALT 13* 13*  --   ALKPHOS 81 83  --   BILITOT 1.9* 2.0*  --   PROT 7.7 7.3  --   ALBUMIN 3.6 3.3*  3.3* 2.9*    Recent Labs Lab 02/20/16 1623  LIPASE 19   CBC:  Recent Labs Lab 02/20/16 1346 02/21/16 0512 02/22/16 0707  WBC 6.6 6.6  4.2  NEUTROABS 5.1  --   --   HGB 12.6* 11.9* 10.2*  HCT 36.8* 34.7* 31.0*  MCV 95.1 94.3 93.9  PLT 213 196 146*  CBG:  Recent Labs Lab 02/20/16 1449 02/20/16 1500 02/20/16 1532 02/21/16 1239 02/21/16 1702  GLUCAP 40* 141* 167* 109* 101*    Medications:   . calcitRIOL  0.5 mcg Oral Q M,W,F-HD  . calcium acetate  2,001 mg Oral TID WC  . carvedilol  25 mg Oral BID WC  . diclofenac sodium  2 g Topical TID AC & HS  . ferric gluconate (FERRLECIT/NULECIT) IV  125 mg Intravenous Q M,W,F-HD  . heparin  4,000 Units Dialysis Once in dialysis  . heparin  5,000 Units Subcutaneous 3 times per day  . insulin aspart  10 Units Intravenous Once  . isosorbide mononitrate  60 mg Oral Daily  . losartan  100 mg Oral Daily  . pantoprazole  40 mg Oral Daily  . sodium chloride flush  3 mL Intravenous Q12H

## 2016-02-23 DIAGNOSIS — Z9119 Patient's noncompliance with other medical treatment and regimen: Secondary | ICD-10-CM

## 2016-02-23 DIAGNOSIS — I429 Cardiomyopathy, unspecified: Secondary | ICD-10-CM

## 2016-02-23 DIAGNOSIS — Z992 Dependence on renal dialysis: Secondary | ICD-10-CM

## 2016-02-23 DIAGNOSIS — N186 End stage renal disease: Secondary | ICD-10-CM

## 2016-02-23 DIAGNOSIS — I132 Hypertensive heart and chronic kidney disease with heart failure and with stage 5 chronic kidney disease, or end stage renal disease: Secondary | ICD-10-CM | POA: Diagnosis not present

## 2016-02-23 LAB — PARATHYROID HORMONE, INTACT (NO CA): PTH: 177 pg/mL — ABNORMAL HIGH (ref 15–65)

## 2016-02-23 MED ORDER — HYDRALAZINE HCL 10 MG PO TABS
10.0000 mg | ORAL_TABLET | Freq: Three times a day (TID) | ORAL | Status: DC
  Administered 2016-02-23 – 2016-02-24 (×3): 10 mg via ORAL
  Filled 2016-02-23 (×3): qty 1

## 2016-02-23 MED ORDER — DIPHENHYDRAMINE HCL 12.5 MG/5ML PO ELIX
12.5000 mg | ORAL_SOLUTION | Freq: Every evening | ORAL | Status: DC | PRN
  Administered 2016-02-23: 12.5 mg via ORAL
  Filled 2016-02-23 (×2): qty 5

## 2016-02-23 MED ORDER — DEXTROMETHORPHAN POLISTIREX ER 30 MG/5ML PO SUER
30.0000 mg | Freq: Two times a day (BID) | ORAL | Status: DC | PRN
  Administered 2016-02-23 (×2): 30 mg via ORAL
  Filled 2016-02-23 (×4): qty 5

## 2016-02-23 MED ORDER — DOXYLAMINE SUCCINATE (SLEEP) 25 MG PO TABS
25.0000 mg | ORAL_TABLET | Freq: Every evening | ORAL | Status: DC | PRN
  Filled 2016-02-23: qty 1

## 2016-02-23 MED ORDER — DICLOFENAC SODIUM 1 % TD GEL
4.0000 g | Freq: Three times a day (TID) | TRANSDERMAL | Status: DC
  Administered 2016-02-23 – 2016-02-25 (×7): 4 g via TOPICAL
  Filled 2016-02-23: qty 100

## 2016-02-23 NOTE — Progress Notes (Signed)
Primary cardiologist: Dr. Fransico Him  Seen for followup: Cardiomyopathy  Subjective:    Breathing more easily. No chest pain or dizziness.  Objective:   Temp:  [97.9 F (36.6 C)-98.4 F (36.9 C)] 98.4 F (36.9 C) (03/11 0907) Pulse Rate:  [64-80] 80 (03/11 0907) Resp:  [16-22] 20 (03/11 0907) BP: (117-133)/(64-110) 129/89 mmHg (03/11 0907) SpO2:  [94 %-100 %] 95 % (03/11 0907) Weight:  [170 lb (77.111 kg)] 170 lb (77.111 kg) (03/10 2238) Last BM Date: 02/22/16  Filed Weights   02/22/16 0644 02/22/16 1000 02/22/16 2238  Weight: 167 lb 15.9 oz (76.2 kg) 161 lb 13.1 oz (73.4 kg) 170 lb (77.111 kg)    Intake/Output Summary (Last 24 hours) at 02/23/16 1402 Last data filed at 02/23/16 0908  Gross per 24 hour  Intake   1080 ml  Output      0 ml  Net   1080 ml    Telemetry: Sinus rhythm with PVCs.  Exam:  General: Appears comfortable at rest.  Lungs: Scattered rhonchi, no wheezes.  Cardiac: Elevated JVP. Indistinct PMI with RRR, 2/6 apical systolic murmur.  Abdomen: NABS.  Extremities: Trace edema.  Lab Results:  Basic Metabolic Panel:  Recent Labs Lab 02/20/16 1346 02/20/16 1623 02/21/16 0512 02/21/16 0715 02/22/16 0706  NA 137  --  135  --  134*  K 7.1*  --  4.6  --  3.4*  CL 90*  --  90*  --  95*  CO2 16*  --  20*  --  25  GLUCOSE 63* 131* 82  --  101*  BUN 120*  --  58*  --  32*  CREATININE 18.43*  --  11.46*  --  8.55*  CALCIUM 8.9  --  8.5*  --  8.4*  MG  --   --   --  2.3  --     Liver Function Tests:  Recent Labs Lab 02/20/16 1346 02/21/16 0512 02/22/16 0706  AST 22 19  --   ALT 13* 13*  --   ALKPHOS 81 83  --   BILITOT 1.9* 2.0*  --   PROT 7.7 7.3  --   ALBUMIN 3.6 3.3*  3.3* 2.9*    CBC:  Recent Labs Lab 02/20/16 1346 02/21/16 0512 02/22/16 0707  WBC 6.6 6.6 4.2  HGB 12.6* 11.9* 10.2*  HCT 36.8* 34.7* 31.0*  MCV 95.1 94.3 93.9  PLT 213 196 146*    Coagulation:  Recent Labs Lab 02/20/16 2214  INR 1.72*    Echocardiogram 02/21/2016: Study Conclusions  - Left ventricle: The cavity size was normal. Wall thickness was  increased in a pattern of mild LVH. There was mild concentric  hypertrophy. Systolic function was severely reduced. The  estimated ejection fraction was in the range of 25% to 30%.  Diffuse hypokinesis. Features are consistent with a pseudonormal  left ventricular filling pattern, with concomitant abnormal  relaxation and increased filling pressure (grade 2 diastolic  dysfunction). Doppler parameters are consistent with high  ventricular filling pressure. - Aortic valve: Transvalvular velocity was within the normal range.  There was no stenosis. There was no regurgitation. - Mitral valve: There was mild regurgitation. - Left atrium: The atrium was severely dilated. - Right ventricle: The cavity size was mildly dilated. Wall  thickness was normal. Systolic function was moderately reduced. - Right atrium: The atrium was severely dilated. - Atrial septum: No defect or patent foramen ovale was identified  by color flow Doppler. -  Tricuspid valve: There was mild regurgitation. - Pulmonic valve: There was moderate regurgitation. - Pulmonary arteries: Systolic pressure was severely increased. PA  peak pressure: 55 mm Hg (S). - Inferior vena cava: The vessel was dilated. The respirophasic  diameter changes were blunted (< 50%), consistent with elevated  central venous pressure.   Medications:   Scheduled Medications: . calcitRIOL  0.5 mcg Oral Q M,W,F-HD  . calcium acetate  2,001 mg Oral TID WC  . carvedilol  25 mg Oral BID WC  . diclofenac sodium  4 g Topical TID AC & HS  . ferric gluconate (FERRLECIT/NULECIT) IV  125 mg Intravenous Q M,W,F-HD  . heparin  4,000 Units Dialysis Once in dialysis  . heparin  5,000 Units Subcutaneous 3 times per day  . hydrALAZINE  10 mg Oral TID  . insulin aspart  10 Units Intravenous Once  . isosorbide mononitrate  60 mg Oral  Daily  . losartan  100 mg Oral Daily  . pantoprazole  40 mg Oral Daily  . sodium chloride flush  3 mL Intravenous Q12H     PRN Medications:  sodium chloride, sodium chloride, acetaminophen **OR** acetaminophen, albuterol, dextromethorphan, diphenhydrAMINE, Melatonin   Assessment:   1. Cardiomyopathy with LVEF 25-30%, reduced RV function and moderate pulmonary hypertension. Plan is for left and right heart catheterization on Monday.  2. ESRD on HD.  3. Noncompliance.  4. Polysubstance abuse including cocaine and alcohol.  5. Hypertension.  6. NSVT. EP consultation reviewed.   Plan/Discussion:    Continue Coreg, hydralazine, Imdur, and Cozaar. For right and left heart catheterization on Monday.   Satira Sark, M.D., F.A.C.C.

## 2016-02-23 NOTE — Progress Notes (Signed)
Subjective: No acute events overnight. Main complaint of insomnia and bilateral knee pain he associates with lying in the hospital bed. He has continued back right flank/rib pain worsened with cough and twisting motions. He denies CP, SOB.  Objective: Vital signs in last 24 hours: Filed Vitals:   02/22/16 1603 02/22/16 2238 02/23/16 0516 02/23/16 0907  BP: 125/96 117/64 133/110 129/89  Pulse: 66 67 75 80  Temp: 98.1 F (36.7 C) 97.9 F (36.6 C) 98.2 F (36.8 C) 98.4 F (36.9 C)  TempSrc: Oral  Oral Oral  Resp: 17 22 18 20   Height:      Weight:  170 lb (77.111 kg)    SpO2: 94% 100% 99% 95%   Weight change: -10 lb 2.3 oz (-4.6 kg)  Intake/Output Summary (Last 24 hours) at 02/23/16 1242 Last data filed at 02/23/16 0908  Gross per 24 hour  Intake   1320 ml  Output      0 ml  Net   1320 ml   General Apperance: NAD HEENT: Normocephalic, atraumatic Neck: Supple, trachea midline Lungs: Some rhonchi or rales. Breathing comfortably on room air Heart: Regular rate and rhythm Abdomen: Soft, nontender, mildly distended, no rebound/guarding Extremities: Warm and well perfused, no edema Skin: No rashes or lesions Neurologic: Alert and interactive. No gross deficits.  Lab Results: Basic Metabolic Panel:  Recent Labs Lab 02/21/16 0512 02/21/16 0715 02/22/16 0706  NA 135  --  134*  K 4.6  --  3.4*  CL 90*  --  95*  CO2 20*  --  25  GLUCOSE 82  --  101*  BUN 58*  --  32*  CREATININE 11.46*  --  8.55*  CALCIUM 8.5*  --  8.4*  MG  --  2.3  --   PHOS 7.7*  --  4.2   Liver Function Tests:  Recent Labs Lab 02/20/16 1346 02/21/16 0512 02/22/16 0706  AST 22 19  --   ALT 13* 13*  --   ALKPHOS 81 83  --   BILITOT 1.9* 2.0*  --   PROT 7.7 7.3  --   ALBUMIN 3.6 3.3*  3.3* 2.9*   CBC:  Recent Labs Lab 02/20/16 1346 02/21/16 0512 02/22/16 0707  WBC 6.6 6.6 4.2  NEUTROABS 5.1  --   --   HGB 12.6* 11.9* 10.2*  HCT 36.8* 34.7* 31.0*  MCV 95.1 94.3 93.9  PLT 213  196 146*    Micro Results: Recent Results (from the past 240 hour(s))  Blood culture (routine x 2)     Status: None (Preliminary result)   Collection Time: 02/20/16  3:42 PM  Result Value Ref Range Status   Specimen Description BLOOD RIGHT HAND  Final   Special Requests BOTTLES DRAWN AEROBIC ONLY 5CC  Final   Culture NO GROWTH 2 DAYS  Final   Report Status PENDING  Incomplete  MRSA PCR Screening     Status: None   Collection Time: 02/20/16  9:53 PM  Result Value Ref Range Status   MRSA by PCR NEGATIVE NEGATIVE Final    Comment:        The GeneXpert MRSA Assay (FDA approved for NASAL specimens only), is one component of a comprehensive MRSA colonization surveillance program. It is not intended to diagnose MRSA infection nor to guide or monitor treatment for MRSA infections.   Blood culture (routine x 2)     Status: None (Preliminary result)   Collection Time: 02/20/16 11:28 PM  Result Value  Ref Range Status   Specimen Description BLOOD RIGHT HAND  Final   Special Requests IN PEDIATRIC BOTTLE 2CC  Final   Culture NO GROWTH 1 DAY  Final   Report Status PENDING  Incomplete   Studies/Results: Dg Ribs Unilateral Right  02/22/2016  CLINICAL DATA:  Right anterior to lateral rib pain. EXAM: RIGHT RIBS - 2 VIEW COMPARISON:  None. FINDINGS: No fracture or other bone lesions are seen involving the ribs. IMPRESSION: Negative. Electronically Signed   By: Kerby Moors M.D.   On: 02/22/2016 11:45   US Abdomen Limited Ruq  02/21/2016  CLINICAL DATA:  Right upper quadrant abdominal pain. EXAM: US ABDOMEN LIMITED - RIGHT UPPER QUADRANT COMPARISON:  02/21/2016 FINDINGS: Gallbladder: Gallbladder wall thickening at 4 mm. Flat 7 mm gallstone observed. Sonographic Murphy's sign absent. Common bile duct: Diameter: 4 mm Liver: No focal lesion identified. Within normal limits in parenchymal echogenicity. Other: Perihepatic ascites as on prior images such as 02/21/2016. IMPRESSION: 1. Flat 7 mm  gallstone. 2. Gallbladder wall thickening with perihepatic ascites as on the prior CT. Gallbladder wall thickening could be from inflammation although could also be from the patient's known hypoalbuminemia. Electronically Signed   By: Van Clines M.D.   On: 02/21/2016 13:52   Medications: I have reviewed the patient's current medications. Scheduled Meds: . calcitRIOL  0.5 mcg Oral Q M,W,F-HD  . calcium acetate  2,001 mg Oral TID WC  . carvedilol  25 mg Oral BID WC  . diclofenac sodium  2 g Topical TID AC & HS  . ferric gluconate (FERRLECIT/NULECIT) IV  125 mg Intravenous Q M,W,F-HD  . heparin  4,000 Units Dialysis Once in dialysis  . heparin  5,000 Units Subcutaneous 3 times per day  . hydrALAZINE  25 mg Oral TID  . insulin aspart  10 Units Intravenous Once  . isosorbide mononitrate  60 mg Oral Daily  . losartan  100 mg Oral Daily  . pantoprazole  40 mg Oral Daily  . 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   Combined CHF, NSVT, and HTN: Repeat echo demonstrates EF 25-30% and grade 2 diastolic dysfunction.  - Cardiology and EP consulted, appreciated recommendations. - Right and left heart catheterization to rule out CAD and assess PAP on Monday - Coreg 25 mg BID - Losartan 100 mg - Imdur 60 mg  - Hydralazine 25mg  TID  AMS 2/2 Metabolic Derangement and ESRD, improved:  - HD per nephrology  Flank/Right Abdominal Pain: Location and description of pain most c/w MSK pathology. RUQ US shows nonobstructing stone, gallbladder wall thickening (which is chronic for patient), and normal CBD without dilation. Will check PTH given his  renal dysfunction. R rib XR unremarkable. -Dextromethorphan 30mg  BID prn cough [ ]  PTHpending  Hemoptysis secondary to increased pulmonary capillary pressure due to HD noncompliance: Improved - Continue to monitor  Polysubstance Abuse:  - Cessation counseling provided  FEN: Renal diet VTE Ppx: Heparin [place]  Dispo: Disposition is deferred at this time, awaiting improvement of current medical problems.  Anticipated discharge in approximately 2-3 day(s).     LOS: 3 days   Milagros Loll, MD 02/23/2016, 12:42 PM

## 2016-02-23 NOTE — Progress Notes (Signed)
Subjective: HD yest on schedule/ noted patient had asymptomatic 22 beat run of VTach  And Card / IP seeing/ co some lower back pain with movements but does straight Leg raises  In bed with no cos this am   Objective Vital signs in last 24 hours: Filed Vitals:   02/22/16 1530 02/22/16 1603 02/22/16 2238 02/23/16 0516  BP: 124/92 125/96 117/64 133/110  Pulse: 64 66 67 75  Temp: 97.9 F (36.6 C) 98.1 F (36.7 C) 97.9 F (36.6 C) 98.2 F (36.8 C)  TempSrc: Oral Oral  Oral  Resp: 16 17 22 18   Height:      Weight:   77.111 kg (170 lb)   SpO2: 100% 94% 100% 99%   Weight change: -4.6 kg (-10 lb 2.3 oz)      Physical Exam General: alert , OX3,  NAD Heart: RRR no mur , rub or gal. Lungs: soft  exp wheezes bilat / nonlabored breathing Abdomen:  bs pos. Soft NT Extremities: No pedal  edema Dialysis Access: LUA AVF pos bruit   Dialysis Orders: [facility] 4h 400/A1.5 77kg AVF Heparin 5000 calcitriol 0.5 - venofer through 3/20 no ESA        Problem/Plan: 1. Hyperkalemia - resolved 2. ESRD - MWF - lower dry wt 3. NSVT- EP saw  4  Hx systolic CHF/ CM - noted per EP recom Card cath  In setting of NSVT  5 Anemia - hgb 11.9 no ESA 6. Secondary hyperparathyroidism - Resume calcitriol/ ca / phos stable 7 HTN/volume - lowering dry wt/ stable bp on hd and  yest post hd wt to 73.4 kg  (old edw 77kg) 8. Nutrition - renal diet/vit 9. Back pain - per admit tem  - had neg CT angio and abd CT 10. Noncompliance with HD - attendance variable - often signs off early 33. Polysubstance abuse -playing large component to his problems = htn /cardiac arrhythmias/ missing HD     Ernest Haber, PA-C [place] [phone] 02/23/2016,8:38 AM  LOS: 3 days   Pt seen, examined and agree w A/P as above. Recurrent NSVT, hx cocaine abuse.  EP team consulted, for heart cath on Monday.  Cont BB/ nitrates/ ARB, cards added hydralazine 10 tid.  Poor ICD candidate w noncompliance/  drug abuse/ ESRD.  Kelly Splinter MD [facility] pager [phone]    cell [phone] 02/23/2016, 12:58 PM  '  Labs: Basic Metabolic Panel:  Recent Labs Lab 02/20/16 1346 02/20/16 1623 02/21/16 0512 02/22/16 0706  NA 137  --  135 134*  K 7.1*  --  4.6 3.4*  CL 90*  --  90* 95*  CO2 16*  --  20* 25  GLUCOSE 63* 131* 82 101*  BUN 120*  --  58* 32*  CREATININE 18.43*  --  11.46* 8.55*  CALCIUM 8.9  --  8.5* 8.4*  PHOS  --   --  7.7* 4.2   Liver Function Tests:  Recent Labs Lab 02/20/16 1346 02/21/16 0512 02/22/16 0706  AST 22 19  --   ALT 13* 13*  --   ALKPHOS 81 83  --   BILITOT 1.9* 2.0*  --   PROT 7.7 7.3  --   ALBUMIN 3.6 3.3*  3.3* 2.9*    Recent Labs Lab 02/20/16 1623  LIPASE 19   No results for input(s): AMMONIA in the last 168 hours. CBC:  Recent Labs Lab 02/20/16 1346 02/21/16 0512 02/22/16 0707  WBC 6.6 6.6 4.2  NEUTROABS 5.1  --   --   HGB 12.6* 11.9* 10.2*  HCT 36.8* 34.7* 31.0*  MCV 95.1 94.3 93.9  PLT 213 196 146*   Cardiac Enzymes: No results for input(s): CKTOTAL, CKMB, CKMBINDEX, TROPONINI in the last 168 hours. CBG:  Recent Labs Lab 02/20/16 1500 02/20/16 1532 02/21/16 1239 02/21/16 1702 02/22/16 2235  GLUCAP 141* 167* 109* 101* 137*    Medications:   . calcitRIOL  0.5 mcg Oral Q M,W,F-HD  . calcium acetate  2,001 mg Oral TID WC  . carvedilol  25 mg Oral BID WC  . diclofenac sodium  2 g Topical TID AC & HS  . ferric gluconate (FERRLECIT/NULECIT) IV  125 mg Intravenous Q M,W,F-HD  . heparin  4,000 Units Dialysis Once in dialysis  . heparin  5,000 Units Subcutaneous 3 times per day  . hydrALAZINE  25 mg Oral TID  . insulin aspart  10 Units Intravenous Once  . isosorbide mononitrate  60 mg Oral Daily  . losartan  100 mg Oral Daily  . pantoprazole  40 mg Oral Daily  . pneumococcal 23 valent vaccine  0.5 mL Intramuscular Tomorrow-1000  . sodium chloride flush  3 mL Intravenous Q12H

## 2016-02-24 MED ORDER — ASPIRIN 81 MG PO CHEW
81.0000 mg | CHEWABLE_TABLET | ORAL | Status: AC
  Administered 2016-02-25: 81 mg via ORAL
  Filled 2016-02-24: qty 1

## 2016-02-24 MED ORDER — SODIUM CHLORIDE 0.9% FLUSH
3.0000 mL | Freq: Two times a day (BID) | INTRAVENOUS | Status: DC
  Administered 2016-02-24 (×2): 3 mL via INTRAVENOUS

## 2016-02-24 MED ORDER — SODIUM CHLORIDE 0.9 % IV SOLN: 250.0000 mL | INTRAVENOUS | Status: DC | PRN

## 2016-02-24 MED ORDER — ASPIRIN 81 MG PO CHEW: 81.0000 mg | CHEWABLE_TABLET | ORAL | Status: DC

## 2016-02-24 MED ORDER — LOSARTAN POTASSIUM 25 MG PO TABS
25.0000 mg | ORAL_TABLET | Freq: Every day | ORAL | Status: DC
  Administered 2016-02-26: 25 mg via ORAL
  Filled 2016-02-24: qty 1

## 2016-02-24 MED ORDER — SODIUM CHLORIDE 0.9% FLUSH: 3.0000 mL | Freq: Two times a day (BID) | INTRAVENOUS | Status: DC

## 2016-02-24 MED ORDER — SODIUM CHLORIDE 0.9% FLUSH: 3.0000 mL | INTRAVENOUS | Status: DC | PRN

## 2016-02-24 MED ORDER — SODIUM CHLORIDE 0.9 % IV SOLN
INTRAVENOUS | Status: DC
  Administered 2016-02-25: 06:00:00 via INTRAVENOUS

## 2016-02-24 MED ORDER — SODIUM CHLORIDE 0.9 % IV SOLN: INTRAVENOUS | Status: DC

## 2016-02-24 NOTE — Progress Notes (Signed)
Primary cardiologist: Dr. Fransico Him  Seen for followup: Cardiomyopathy  Subjective:    No chest pain or dizziness. No dyspnea at rest.  Objective:   Temp:  [97.4 F (36.3 C)-98.3 F (36.8 C)] 98.1 F (36.7 C) (03/12 0602) Pulse Rate:  [61-71] 71 (03/12 0602) Resp:  [17-18] 18 (03/12 0602) BP: (111-140)/(79-92) 140/92 mmHg (03/12 0602) SpO2:  [100 %] 100 % (03/12 0602) Last BM Date: 02/23/16  Filed Weights   02/22/16 0644 02/22/16 1000 02/22/16 2238  Weight: 167 lb 15.9 oz (76.2 kg) 161 lb 13.1 oz (73.4 kg) 170 lb (77.111 kg)    Intake/Output Summary (Last 24 hours) at 02/24/16 1031 Last data filed at 02/23/16 2251  Gross per 24 hour  Intake    720 ml  Output      0 ml  Net    720 ml    Telemetry: Sinus rhythm with PVCs and short bursts of NSVT.  Exam:  General: Appears comfortable at rest.  Lungs: Scattered rhonchi, no wheezes.  Cardiac: Elevated JVP. Indistinct PMI with RRR, 2/6 apical systolic murmur.  Abdomen: NABS.  Extremities: Trace edema.  Lab Results:  Basic Metabolic Panel:  Recent Labs Lab 02/20/16 1346 02/20/16 1623 02/21/16 0512 02/21/16 0715 02/22/16 0706  NA 137  --  135  --  134*  K 7.1*  --  4.6  --  3.4*  CL 90*  --  90*  --  95*  CO2 16*  --  20*  --  25  GLUCOSE 63* 131* 82  --  101*  BUN 120*  --  58*  --  32*  CREATININE 18.43*  --  11.46*  --  8.55*  CALCIUM 8.9  --  8.5*  --  8.4*  MG  --   --   --  2.3  --     Liver Function Tests:  Recent Labs Lab 02/20/16 1346 02/21/16 0512 02/22/16 0706  AST 22 19  --   ALT 13* 13*  --   ALKPHOS 81 83  --   BILITOT 1.9* 2.0*  --   PROT 7.7 7.3  --   ALBUMIN 3.6 3.3*  3.3* 2.9*    CBC:  Recent Labs Lab 02/20/16 1346 02/21/16 0512 02/22/16 0707  WBC 6.6 6.6 4.2  HGB 12.6* 11.9* 10.2*  HCT 36.8* 34.7* 31.0*  MCV 95.1 94.3 93.9  PLT 213 196 146*    Coagulation:  Recent Labs Lab 02/20/16 2214  INR 1.72*   Echocardiogram 02/21/2016: Study  Conclusions  - Left ventricle: The cavity size was normal. Wall thickness was  increased in a pattern of mild LVH. There was mild concentric  hypertrophy. Systolic function was severely reduced. The  estimated ejection fraction was in the range of 25% to 30%.  Diffuse hypokinesis. Features are consistent with a pseudonormal  left ventricular filling pattern, with concomitant abnormal  relaxation and increased filling pressure (grade 2 diastolic  dysfunction). Doppler parameters are consistent with high  ventricular filling pressure. - Aortic valve: Transvalvular velocity was within the normal range.  There was no stenosis. There was no regurgitation. - Mitral valve: There was mild regurgitation. - Left atrium: The atrium was severely dilated. - Right ventricle: The cavity size was mildly dilated. Wall  thickness was normal. Systolic function was moderately reduced. - Right atrium: The atrium was severely dilated. - Atrial septum: No defect or patent foramen ovale was identified  by color flow Doppler. - Tricuspid valve: There was mild  regurgitation. - Pulmonic valve: There was moderate regurgitation. - Pulmonary arteries: Systolic pressure was severely increased. PA  peak pressure: 55 mm Hg (S). - Inferior vena cava: The vessel was dilated. The respirophasic  diameter changes were blunted (< 50%), consistent with elevated  central venous pressure.   Medications:   Scheduled Medications: . [START ON 02/25/2016] aspirin  81 mg Oral Pre-Cath  . calcitRIOL  0.5 mcg Oral Q M,W,F-HD  . calcium acetate  2,001 mg Oral TID WC  . carvedilol  25 mg Oral BID WC  . diclofenac sodium  4 g Topical TID AC & HS  . ferric gluconate (FERRLECIT/NULECIT) IV  125 mg Intravenous Q M,W,F-HD  . heparin  5,000 Units Subcutaneous 3 times per day  . hydrALAZINE  10 mg Oral TID  . isosorbide mononitrate  60 mg Oral Daily  . losartan  100 mg Oral Daily  . pantoprazole  40 mg Oral Daily  .  sodium chloride flush  3 mL Intravenous Q12H  . sodium chloride flush  3 mL Intravenous Q12H    PRN Medications: sodium chloride, acetaminophen **OR** acetaminophen, albuterol, dextromethorphan, diphenhydrAMINE, Melatonin, sodium chloride flush   Assessment:   1. Cardiomyopathy with LVEF 25-30%, reduced RV function and moderate pulmonary hypertension. Plan is for left and right heart catheterization on Monday.  2. ESRD on HD.  3. Noncompliance.  4. Polysubstance abuse including cocaine and alcohol.  5. Hypertension.  6. NSVT. EP consultation reviewed.   Plan/Discussion:    Continue Coreg, hydralazine, Imdur, and Cozaar. For right and left heart catheterization on Monday.   Satira Sark, M.D., F.A.C.C.

## 2016-02-24 NOTE — Progress Notes (Addendum)
Subjective: no complaints  Objective Vital signs in last 24 hours: Filed Vitals:   02/23/16 0907 02/23/16 1635 02/23/16 2059 02/24/16 0602  BP: 129/89 124/86 111/79 140/92  Pulse: 80 71 61 71  Temp: 98.4 F (36.9 C) 98.3 F (36.8 C) 97.4 F (36.3 C) 98.1 F (36.7 C)  TempSrc: Oral Oral Oral Oral  Resp: 20 18 17 18   Height:      Weight:      SpO2: 95% 100% 100% 100%   Weight change:       Physical Exam: General: alert , OX3,  NAD Heart: RRR no mur , rub or gal. Lungs: soft  exp wheezes bilat / nonlabored breathing Abdomen:  bs pos. Soft NT Extremities: No pedal  edema Dialysis Access: LUA AVF pos bruit   Dialysis Orders: [facility] M[facility] 4h 400/A1.5 77kg AVF Heparin 5000  LUA AVF calcitriol 0.5 venofer through 3/20 no ESA      Assessment: 1. Hyperkalemia - resolved 2. ESRD - MWF lowering dry wt 3. NSVT- EP saw  4  Hx systolic CHF/ CM EF [id number] NSVT - for heart cath tomorrow 5  Anemia - hgb 11.9 no ESA 6. Secondary hyperparathyroidism - Resume calcitriol/ ca / phos stable 7  HTN/volume - at dry wt, lower BP meds and get vol down further 8. Nutrition - renal diet/vit 9. Back pain - per admit tem  - had neg CT angio and abd CT 10. Noncompliance with HD - attendance variable - often signs off early 80. Polysubstance abuse -playing large component to his problems = htn /cardiac arrhythmias/ missing HD      Plan - for heart cath tomorrow. HD tomorrow , sched around cath. Lower BP meds, get vol down further   Labs: Basic Metabolic Panel:  Recent Labs Lab 02/20/16 1346 02/20/16 1623 02/21/16 0512 02/22/16 0706  NA 137  --  135 134*  K 7.1*  --  4.6 3.4*  CL 90*  --  90* 95*  CO2 16*  --  20* 25  GLUCOSE 63* 131* 82 101*  BUN 120*  --  58* 32*  CREATININE 18.43*  --  11.46* 8.55*  CALCIUM 8.9  --  8.5* 8.4*  PHOS  --   --  7.7* 4.2   Liver Function Tests:  Recent Labs Lab 02/20/16 1346 02/21/16 0512 02/22/16 0706  AST 22 19  --   ALT 13*  13*  --   ALKPHOS 81 83  --   BILITOT 1.9* 2.0*  --   PROT 7.7 7.3  --   ALBUMIN 3.6 3.3*  3.3* 2.9*    Recent Labs Lab 02/20/16 1623  LIPASE 19   No results for input(s): AMMONIA in the last 168 hours. CBC:  Recent Labs Lab 02/20/16 1346 02/21/16 0512 02/22/16 0707  WBC 6.6 6.6 4.2  NEUTROABS 5.1  --   --   HGB 12.6* 11.9* 10.2*  HCT 36.8* 34.7* 31.0*  MCV 95.1 94.3 93.9  PLT 213 196 146*   Cardiac Enzymes: No results for input(s): CKTOTAL, CKMB, CKMBINDEX, TROPONINI in the last 168 hours. CBG:  Recent Labs Lab 02/20/16 1500 02/20/16 1532 02/21/16 1239 02/21/16 1702 02/22/16 2235  GLUCAP 141* 167* 109* 101* 137*    Medications: . [START ON 02/25/2016] sodium chloride     . [START ON 02/25/2016] aspirin  81 mg Oral Pre-Cath  . calcitRIOL  0.5 mcg Oral Q M,W,F-HD  . calcium acetate  2,001 mg Oral TID WC  . carvedilol  25 mg Oral BID WC  . diclofenac sodium  4 g Topical TID AC & HS  . ferric gluconate (FERRLECIT/NULECIT) IV  125 mg Intravenous Q M,W,F-HD  . heparin  5,000 Units Subcutaneous 3 times per day  . hydrALAZINE  10 mg Oral TID  . isosorbide mononitrate  60 mg Oral Daily  . losartan  100 mg Oral Daily  . pantoprazole  40 mg Oral Daily  . sodium chloride flush  3 mL Intravenous Q12H  . sodium chloride flush  3 mL Intravenous Q12H

## 2016-02-24 NOTE — Progress Notes (Signed)
Subjective: No acute events overnight. Patient left the unit on his own accord to buy food from the cafeteria.  He denies chest pain, palpitations, or SOB.  His back/flank pain is improving and almost resolved.  He feels "really good."  Objective: Vital signs in last 24 hours: Filed Vitals:   02/23/16 0907 02/23/16 1635 02/23/16 2059 02/24/16 0602  BP: 129/89 124/86 111/79 140/92  Pulse: 80 71 61 71  Temp: 98.4 F (36.9 C) 98.3 F (36.8 C) 97.4 F (36.3 C) 98.1 F (36.7 C)  TempSrc: Oral Oral Oral Oral  Resp: 20 18 17 18   Height:      Weight:      SpO2: 95% 100% 100% 100%   Weight change:   Intake/Output Summary (Last 24 hours) at 02/24/16 0700 Last data filed at 02/23/16 2251  Gross per 24 hour  Intake   1080 ml  Output      0 ml  Net   1080 ml   General Apperance: NAD HEENT: Normocephalic, atraumatic Neck: Supple, trachea midline Lungs: Minimal diffuse rhonchi. Breathing comfortably on room air Heart: Regular rate and rhythm Abdomen: Soft, nontender, mildly distended, no rebound/guarding Extremities: Warm and well perfused, no edema Skin: No rashes or lesions Neurologic: Alert and interactive. No gross deficits.  Lab Results: Basic Metabolic Panel:  Recent Labs Lab 02/21/16 0512 02/21/16 0715 02/22/16 0706  NA 135  --  134*  K 4.6  --  3.4*  CL 90*  --  95*  CO2 20*  --  25  GLUCOSE 82  --  101*  BUN 58*  --  32*  CREATININE 11.46*  --  8.55*  CALCIUM 8.5*  --  8.4*  MG  --  2.3  --   PHOS 7.7*  --  4.2   Liver Function Tests:  Recent Labs Lab 02/20/16 1346 02/21/16 0512 02/22/16 0706  AST 22 19  --   ALT 13* 13*  --   ALKPHOS 81 83  --   BILITOT 1.9* 2.0*  --   PROT 7.7 7.3  --   ALBUMIN 3.6 3.3*  3.3* 2.9*   CBC:  Recent Labs Lab 02/20/16 1346 02/21/16 0512 02/22/16 0707  WBC 6.6 6.6 4.2  NEUTROABS 5.1  --   --   HGB 12.6* 11.9* 10.2*  HCT 36.8* 34.7* 31.0*  MCV 95.1 94.3 93.9  PLT 213 196 146*    Micro Results: Recent  Results (from the past 240 hour(s))  Blood culture (routine x 2)     Status: None (Preliminary result)   Collection Time: 02/20/16  3:42 PM  Result Value Ref Range Status   Specimen Description BLOOD RIGHT HAND  Final   Special Requests BOTTLES DRAWN AEROBIC ONLY 5CC  Final   Culture NO GROWTH 3 DAYS  Final   Report Status PENDING  Incomplete  MRSA PCR Screening     Status: None   Collection Time: 02/20/16  9:53 PM  Result Value Ref Range Status   MRSA by PCR NEGATIVE NEGATIVE Final    Comment:        The GeneXpert MRSA Assay (FDA approved for NASAL specimens only), is one component of a comprehensive MRSA colonization surveillance program. It is not intended to diagnose MRSA infection nor to guide or monitor treatment for MRSA infections.   Blood culture (routine x 2)     Status: None (Preliminary result)   Collection Time: 02/20/16 11:28 PM  Result Value Ref Range Status   Specimen  Description BLOOD RIGHT HAND  Final   Special Requests IN PEDIATRIC BOTTLE 2CC  Final   Culture NO GROWTH 2 DAYS  Final   Report Status PENDING  Incomplete   Studies/Results: Dg Ribs Unilateral Right  02/22/2016  CLINICAL DATA:  Right anterior to lateral rib pain. EXAM: RIGHT RIBS - 2 VIEW COMPARISON:  None. FINDINGS: No fracture or other bone lesions are seen involving the ribs. IMPRESSION: Negative. Electronically Signed   By: Kerby Moors M.D.   On: 02/22/2016 11:45   Medications: I have reviewed the patient's current medications. Scheduled Meds: . calcitRIOL  0.5 mcg Oral Q M,W,F-HD  . calcium acetate  2,001 mg Oral TID WC  . carvedilol  25 mg Oral BID WC  . diclofenac sodium  4 g Topical TID AC & HS  . ferric gluconate (FERRLECIT/NULECIT) IV  125 mg Intravenous Q M,W,F-HD  . heparin  4,000 Units Dialysis Once in dialysis  . heparin  5,000 Units Subcutaneous 3 times per day  . hydrALAZINE  10 mg Oral TID  . insulin aspart  10 Units Intravenous Once  . isosorbide mononitrate  60 mg Oral  Daily  . losartan  100 mg Oral Daily  . pantoprazole  40 mg Oral Daily  . sodium chloride flush  3 mL Intravenous Q12H   Continuous Infusions:  PRN Meds:.sodium chloride, sodium chloride, acetaminophen **OR** acetaminophen, albuterol, dextromethorphan, diphenhydrAMINE, Melatonin Assessment/Plan:  Mr. 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   Combined CHF, NSVT, and HTN: Repeat echo demonstrates EF 25-30% and grade 2 diastolic dysfunction. Telemetry showing runs of NSVT, up to 21-22 beats.  Cardiology planning EP and cath on Monday. - Cardiology and EP consulted, appreciated recommendations. - Coreg 25 mg BID - Losartan 100 mg - Imdur 60 mg  - Hydralazine 25mg  TID  AMS 2/2 Metabolic Derangement and ESRD, improved:  - HD per nephrology  Flank/Right Abdominal Pain, improving: Location and description of pain most c/w MSK pathology. RUQ US shows nonobstructing stone, gallbladder wall thickening (which is chronic for patient), and normal CBD without dilation. Right rib XR unremarkable. PTH elevated, likely secondary hyperparathyroidism in the setting of renal disease.  Corrected calcium normal.  Phosphorous normal.   Management per Neurology. -Dextromethorphan 30mg  BID prn cough  Hemoptysis secondary to increased pulmonary capillary pressure due to HD noncompliance: Improved - Continue to monitor  Polysubstance Abuse:  - Cessation counseling provided  FEN: Renal diet VTE Ppx: Heparin [place]  Dispo: Disposition is deferred at this time, awaiting improvement of current medical problems.  Anticipated discharge in approximately 2-3 day(s).     LOS: 4 days   Iline Oven, MD 02/24/2016, 7:00 AM

## 2016-02-24 NOTE — Progress Notes (Signed)
[id number] , patient insisted on going downstairs to the cafeteria to go buy  his breakfast against staff advice.He was advised not to go out of the unit because he was on a heart monitor but he said that he needed to buy his breakfast and that he will be right back immediately because he is a 50 year old adult and that nothing will happen to him.At 0605hrs patient went downstairs to the cafeteria with his heart monitor on ,after staff finished taking his vitals.

## 2016-02-25 ENCOUNTER — Encounter (HOSPITAL_COMMUNITY): Payer: Self-pay | Admitting: Cardiovascular Disease

## 2016-02-25 ENCOUNTER — Encounter (HOSPITAL_COMMUNITY)
Admission: EM | Disposition: A | Discharge status: Home / Self Care | Payer: Self-pay | Source: Home / Self Care | Attending: Internal Medicine

## 2016-02-25 HISTORY — PX: CARDIAC CATHETERIZATION: SHX172

## 2016-02-25 LAB — RENAL FUNCTION PANEL
ALBUMIN: 3 g/dL — AB (ref 3.5–5.0)
ANION GAP: 17 — AB (ref 5–15)
BUN: 61 mg/dL — ABNORMAL HIGH (ref 6–20)
CO2: 21 mmol/L — ABNORMAL LOW (ref 22–32)
Calcium: 8.7 mg/dL — ABNORMAL LOW (ref 8.9–10.3)
Chloride: 98 mmol/L — ABNORMAL LOW (ref 101–111)
Creatinine, Ser: 10.74 mg/dL — ABNORMAL HIGH (ref 0.61–1.24)
GFR calc Af Amer: 6 mL/min — ABNORMAL LOW (ref >60)
GFR calc non Af Amer: 5 mL/min — ABNORMAL LOW (ref >60)
GLUCOSE: 130 mg/dL — AB (ref 65–99)
PHOSPHORUS: 5.8 mg/dL — AB (ref 2.5–4.6)
POTASSIUM: 4.1 mmol/L (ref 3.5–5.1)
Sodium: 136 mmol/L (ref 135–145)

## 2016-02-25 LAB — POCT I-STAT 3, ART BLOOD GAS (G3+)
Acid-base deficit: 5 mmol/L — ABNORMAL HIGH (ref 0.0–2.0)
BICARBONATE: 20.7 meq/L (ref 20.0–24.0)
O2 Saturation: 85 %
PCO2 ART: 40.9 mmHg (ref 35.0–45.0)
PH ART: 7.312 — AB (ref 7.350–7.450)
TCO2: 22 mmol/L (ref 0–100)
pO2, Arterial: 54 mmHg — ABNORMAL LOW (ref 80.0–100.0)

## 2016-02-25 LAB — CULTURE, BLOOD (ROUTINE X 2): CULTURE: NO GROWTH

## 2016-02-25 LAB — POCT I-STAT 3, VENOUS BLOOD GAS (G3P V)
Acid-base deficit: 3 mmol/L — ABNORMAL HIGH (ref 0.0–2.0)
Bicarbonate: 22.4 mEq/L (ref 20.0–24.0)
O2 SAT: 63 %
PCO2 VEN: 41.4 mmHg — AB (ref 45.0–50.0)
PO2 VEN: 35 mmHg (ref 31.0–45.0)
TCO2: 24 mmol/L (ref 0–100)
pH, Ven: 7.341 — ABNORMAL HIGH (ref 7.250–7.300)

## 2016-02-25 LAB — CBC
HEMATOCRIT: 32.3 % — AB (ref 39.0–52.0)
HEMOGLOBIN: 10.5 g/dL — AB (ref 13.0–17.0)
MCH: 31.1 pg (ref 26.0–34.0)
MCHC: 32.5 g/dL (ref 30.0–36.0)
MCV: 95.6 fL (ref 78.0–100.0)
Platelets: 152 10*3/uL (ref 150–400)
RBC: 3.38 MIL/uL — ABNORMAL LOW (ref 4.22–5.81)
RDW: 17.5 % — ABNORMAL HIGH (ref 11.5–15.5)
WBC: 4.6 10*3/uL (ref 4.0–10.5)

## 2016-02-25 LAB — PROTIME-INR
INR: 1.24 (ref 0.00–1.49)
PROTHROMBIN TIME: 15.8 s — AB (ref 11.6–15.2)

## 2016-02-25 SURGERY — RIGHT/LEFT HEART CATH AND CORONARY ANGIOGRAPHY

## 2016-02-25 MED ORDER — MIDAZOLAM HCL 2 MG/2ML IJ SOLN
INTRAMUSCULAR | Status: DC | PRN
  Administered 2016-02-25 (×2): 1 mg via INTRAVENOUS

## 2016-02-25 MED ORDER — SODIUM CHLORIDE 0.9 % IV SOLN: 250.0000 mL | INTRAVENOUS | Status: DC | PRN

## 2016-02-25 MED ORDER — SODIUM CHLORIDE 0.9% FLUSH
3.0000 mL | Freq: Two times a day (BID) | INTRAVENOUS | Status: DC
  Administered 2016-02-25 – 2016-02-26 (×2): 3 mL via INTRAVENOUS

## 2016-02-25 MED ORDER — MIDAZOLAM HCL 2 MG/2ML IJ SOLN
INTRAMUSCULAR | Status: AC
  Filled 2016-02-25: qty 2

## 2016-02-25 MED ORDER — LIDOCAINE HCL (PF) 1 % IJ SOLN
INTRAMUSCULAR | Status: DC | PRN
  Administered 2016-02-25: 18 mL

## 2016-02-25 MED ORDER — FENTANYL CITRATE (PF) 100 MCG/2ML IJ SOLN
INTRAMUSCULAR | Status: DC | PRN
  Administered 2016-02-25 (×2): 25 ug via INTRAVENOUS

## 2016-02-25 MED ORDER — HEPARIN (PORCINE) IN NACL 2-0.9 UNIT/ML-% IJ SOLN
INTRAMUSCULAR | Status: AC
  Filled 2016-02-25: qty 500

## 2016-02-25 MED ORDER — FENTANYL CITRATE (PF) 100 MCG/2ML IJ SOLN
INTRAMUSCULAR | Status: AC
  Filled 2016-02-25: qty 2

## 2016-02-25 MED ORDER — SODIUM CHLORIDE 0.9% FLUSH
3.0000 mL | Freq: Two times a day (BID) | INTRAVENOUS | Status: DC
  Administered 2016-02-25 – 2016-02-26 (×3): 3 mL via INTRAVENOUS

## 2016-02-25 MED ORDER — IOHEXOL 350 MG/ML SOLN
INTRAVENOUS | Status: DC | PRN
  Administered 2016-02-25: 55 mL via INTRA_ARTERIAL

## 2016-02-25 MED ORDER — HEPARIN (PORCINE) IN NACL 2-0.9 UNIT/ML-% IJ SOLN
INTRAMUSCULAR | Status: DC | PRN
Start: 2016-02-25 — End: 2016-02-25
  Administered 2016-02-25: 1500 mL

## 2016-02-25 MED ORDER — SODIUM CHLORIDE 0.9% FLUSH: 3.0000 mL | INTRAVENOUS | Status: DC | PRN

## 2016-02-25 MED ORDER — LIDOCAINE HCL (PF) 1 % IJ SOLN
INTRAMUSCULAR | Status: AC
  Filled 2016-02-25: qty 30

## 2016-02-25 MED ORDER — HEPARIN (PORCINE) IN NACL 2-0.9 UNIT/ML-% IJ SOLN
INTRAMUSCULAR | Status: AC
  Filled 2016-02-25: qty 1000

## 2016-02-25 SURGICAL SUPPLY — 13 items
CATH INFINITI 5FR MULTPACK ANG (CATHETERS) ×3 IMPLANT
CATH SWAN GANZ 7F STRAIGHT (CATHETERS) ×3 IMPLANT
KIT HEART LEFT (KITS) ×3 IMPLANT
PACK CARDIAC CATHETERIZATION (CUSTOM PROCEDURE TRAY) ×3 IMPLANT
SHEATH BRITE TIP 6FR 35CM (SHEATH) ×3 IMPLANT
SHEATH PINNACLE 5F 10CM (SHEATH) ×3 IMPLANT
SHEATH PINNACLE 6F 10CM (SHEATH) ×3 IMPLANT
SHEATH PINNACLE 7F 10CM (SHEATH) ×3 IMPLANT
SYR MEDRAD MARK V 150ML (SYRINGE) ×3 IMPLANT
TRANSDUCER W/STOPCOCK (MISCELLANEOUS) ×6 IMPLANT
WIRE EMERALD 3MM-J .025X260CM (WIRE) ×3 IMPLANT
WIRE EMERALD 3MM-J .035X150CM (WIRE) ×3 IMPLANT
WIRE HI TORQ VERSACORE-J 145CM (WIRE) ×3 IMPLANT

## 2016-02-25 NOTE — Plan of Care (Signed)
Problem: Consults Goal: Cardiac Cath Patient Education (See Patient Education module for education specifics.)  Outcome: Progressing Pt was educated on the cardiac cath procedure and the important of staying NPO from midnight prior to procedure

## 2016-02-25 NOTE — H&P (View-Only) (Signed)
Primary cardiologist: Dr. Fransico Him  Seen for followup: Cardiomyopathy  Subjective:    No chest pain or dizziness. No dyspnea at rest.  Objective:   Temp:  [97.4 F (36.3 C)-98.3 F (36.8 C)] 98.1 F (36.7 C) (03/12 0602) Pulse Rate:  [61-71] 71 (03/12 0602) Resp:  [17-18] 18 (03/12 0602) BP: (111-140)/(79-92) 140/92 mmHg (03/12 0602) SpO2:  [100 %] 100 % (03/12 0602) Last BM Date: 02/23/16  Filed Weights   02/22/16 0644 02/22/16 1000 02/22/16 2238  Weight: 167 lb 15.9 oz (76.2 kg) 161 lb 13.1 oz (73.4 kg) 170 lb (77.111 kg)    Intake/Output Summary (Last 24 hours) at 02/24/16 1031 Last data filed at 02/23/16 2251  Gross per 24 hour  Intake    720 ml  Output      0 ml  Net    720 ml    Telemetry: Sinus rhythm with PVCs and short bursts of NSVT.  Exam:  General: Appears comfortable at rest.  Lungs: Scattered rhonchi, no wheezes.  Cardiac: Elevated JVP. Indistinct PMI with RRR, 2/6 apical systolic murmur.  Abdomen: NABS.  Extremities: Trace edema.  Lab Results:  Basic Metabolic Panel:  Recent Labs Lab 02/20/16 1346 02/20/16 1623 02/21/16 0512 02/21/16 0715 02/22/16 0706  NA 137  --  135  --  134*  K 7.1*  --  4.6  --  3.4*  CL 90*  --  90*  --  95*  CO2 16*  --  20*  --  25  GLUCOSE 63* 131* 82  --  101*  BUN 120*  --  58*  --  32*  CREATININE 18.43*  --  11.46*  --  8.55*  CALCIUM 8.9  --  8.5*  --  8.4*  MG  --   --   --  2.3  --     Liver Function Tests:  Recent Labs Lab 02/20/16 1346 02/21/16 0512 02/22/16 0706  AST 22 19  --   ALT 13* 13*  --   ALKPHOS 81 83  --   BILITOT 1.9* 2.0*  --   PROT 7.7 7.3  --   ALBUMIN 3.6 3.3*  3.3* 2.9*    CBC:  Recent Labs Lab 02/20/16 1346 02/21/16 0512 02/22/16 0707  WBC 6.6 6.6 4.2  HGB 12.6* 11.9* 10.2*  HCT 36.8* 34.7* 31.0*  MCV 95.1 94.3 93.9  PLT 213 196 146*    Coagulation:  Recent Labs Lab 02/20/16 2214  INR 1.72*   Echocardiogram 02/21/2016: Study  Conclusions  - Left ventricle: The cavity size was normal. Wall thickness was  increased in a pattern of mild LVH. There was mild concentric  hypertrophy. Systolic function was severely reduced. The  estimated ejection fraction was in the range of 25% to 30%.  Diffuse hypokinesis. Features are consistent with a pseudonormal  left ventricular filling pattern, with concomitant abnormal  relaxation and increased filling pressure (grade 2 diastolic  dysfunction). Doppler parameters are consistent with high  ventricular filling pressure. - Aortic valve: Transvalvular velocity was within the normal range.  There was no stenosis. There was no regurgitation. - Mitral valve: There was mild regurgitation. - Left atrium: The atrium was severely dilated. - Right ventricle: The cavity size was mildly dilated. Wall  thickness was normal. Systolic function was moderately reduced. - Right atrium: The atrium was severely dilated. - Atrial septum: No defect or patent foramen ovale was identified  by color flow Doppler. - Tricuspid valve: There was mild  regurgitation. - Pulmonic valve: There was moderate regurgitation. - Pulmonary arteries: Systolic pressure was severely increased. PA  peak pressure: 55 mm Hg (S). - Inferior vena cava: The vessel was dilated. The respirophasic  diameter changes were blunted (< 50%), consistent with elevated  central venous pressure.   Medications:   Scheduled Medications: . [START ON 02/25/2016] aspirin  81 mg Oral Pre-Cath  . calcitRIOL  0.5 mcg Oral Q M,W,F-HD  . calcium acetate  2,001 mg Oral TID WC  . carvedilol  25 mg Oral BID WC  . diclofenac sodium  4 g Topical TID AC & HS  . ferric gluconate (FERRLECIT/NULECIT) IV  125 mg Intravenous Q M,W,F-HD  . heparin  5,000 Units Subcutaneous 3 times per day  . hydrALAZINE  10 mg Oral TID  . isosorbide mononitrate  60 mg Oral Daily  . losartan  100 mg Oral Daily  . pantoprazole  40 mg Oral Daily  .  sodium chloride flush  3 mL Intravenous Q12H  . sodium chloride flush  3 mL Intravenous Q12H    PRN Medications: sodium chloride, acetaminophen **OR** acetaminophen, albuterol, dextromethorphan, diphenhydrAMINE, Melatonin, sodium chloride flush   Assessment:   1. Cardiomyopathy with LVEF 25-30%, reduced RV function and moderate pulmonary hypertension. Plan is for left and right heart catheterization on Monday.  2. ESRD on HD.  3. Noncompliance.  4. Polysubstance abuse including cocaine and alcohol.  5. Hypertension.  6. NSVT. EP consultation reviewed.   Plan/Discussion:    Continue Coreg, hydralazine, Imdur, and Cozaar. For right and left heart catheterization on Monday.   Satira Sark, M.D., F.A.C.C.

## 2016-02-25 NOTE — Progress Notes (Signed)
Patient refusing to go to hemodialysis at this time.

## 2016-02-25 NOTE — Progress Notes (Signed)
Assessment: 1. Hyperkalemia - resolved 2. ESRD - MWF lowering dry wt 3. NSVT- EP saw  4 Hx systolic CHF/ CM EF [id number] NSVT - for heart cath today & HD 5 Anemia - hgb 11.9 no ESA 6. Secondary hyperparathyroidism - Resume calcitriol/ ca / phos stable 7 HTN/volume - get vol down further as tol 8. Nutrition - renal diet/vit 9. Back pain - per admit tem - had neg CT angio and abd CT 10. Noncompliance with HD - attendance variable - often signs off early 66. Polysubstance abuse -playing large component to his problems = htn /cardiac arrhythmias/ missing HD    Plan - for heart cath tomorrow. HD tomorrow , sched around cath. Lower BP meds, get vol down further  Subjective: Interval History: Better  Objective: Vital signs in last 24 hours: Temp:  [97.5 F (36.4 C)-98.2 F (36.8 C)] 98.2 F (36.8 C) (03/13 0626) Pulse Rate:  [49-72] 52 (03/13 1209) Resp:  [10-55] 14 (03/13 1005) BP: (105-137)/(70-102) 120/92 mmHg (03/13 1209) SpO2:  [74 %-100 %] 100 % (03/13 1139) Weight:  [79.9 kg (176 lb 2.4 oz)] 79.9 kg (176 lb 2.4 oz) (03/12 2050) Weight change:   Intake/Output from previous day: 03/12 0701 - 03/13 0700 In: 20 [P.O.:20] Out: 1 [Urine:1] Intake/Output this shift:    General appearance: alert and cooperative Back: symmetric, no curvature. ROM normal. No CVA tenderness. Resp: clear to auscultation bilaterally Cardio: regular rate and rhythm, S1, S2 normal, no murmur, click, rub or gallop GI: soft, non-tender; bowel sounds normal; no masses,  no organomegaly Extremities: extremities normal, atraumatic, no cyanosis or edema  LUE AV access  Lab Results: No results for input(s): WBC, HGB, HCT, PLT in the last 72 hours. BMET: No results for input(s): NA, K, CL, CO2, GLUCOSE, BUN, CREATININE, CALCIUM in the last 72 hours. No results for input(s): PTH in the last 72 hours. Iron Studies: No results for input(s): IRON, TIBC, TRANSFERRIN, FERRITIN in the last 72  hours. Studies/Results: No results found.  Scheduled: . calcitRIOL  0.5 mcg Oral Q M,W,F-HD  . calcium acetate  2,001 mg Oral TID WC  . carvedilol  25 mg Oral BID WC  . diclofenac sodium  4 g Topical TID AC & HS  . ferric gluconate (FERRLECIT/NULECIT) IV  125 mg Intravenous Q M,W,F-HD  . isosorbide mononitrate  60 mg Oral Daily  . losartan  25 mg Oral Daily  . pantoprazole  40 mg Oral Daily  . sodium chloride flush  3 mL Intravenous Q12H  . sodium chloride flush  3 mL Intravenous Q12H  . sodium chloride flush  3 mL Intravenous Q12H    LOS: 5 days   Cricket Goodlin C 02/25/2016,12:19 PM

## 2016-02-25 NOTE — Progress Notes (Signed)
Internal Medicine Attending  Date: 02/25/2016  Patient name: [name] [name] Medical record number: [id number] Date of birth: November 15, 1966 Age: 50 y.o. Gender: male  I saw and evaluated the patient. I reviewed the resident's note by Dr. Randell Patient and I agree with the resident's findings and plans as documented in her progress note.  [name] underwent left and right heart catheterizations today. His left heart cath was without evidence of coronary artery disease. 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 Right heart catheterization is notable for moderate pulmonary artery hypertension with a mean pulmonary artery pressure of 27 mmHg and significant volume overload with a left ventricular end-diastolic pressure of 47 mmHg. Therefore, he has acute on chronic systolic heart failure with moderate pulmonary hypertension related to left ventricular heart failure. The pulmonary artery hypertension should improve with volume removal during hemodialysis in order to lower his left ventricular end-diastolic pressures. As fluid removal can be adjusted in hemodialysis as an outpatient [name] should be ready for discharge home tomorrow morning.

## 2016-02-25 NOTE — Progress Notes (Signed)
Subjective: No acute events overnight. No chest pain, palpitations, or SOB.  Objective: Vital signs in last 24 hours: Filed Vitals:   02/25/16 0950 02/25/16 0955 02/25/16 1000 02/25/16 1005  BP: 108/70 118/85 114/78 113/82  Pulse: 53 51 50 49  Temp:      TempSrc:      Resp: 10 11 12 14   Height:      Weight:      SpO2: 100% 99% 98% 98%   Weight change:   Intake/Output Summary (Last 24 hours) at 02/25/16 1013 Last data filed at 02/25/16 0600  Gross per 24 hour  Intake     20 ml  Output      1 ml  Net     19 ml   General Apperance: NAD HEENT: Normocephalic, atraumatic Neck: Supple, trachea midline Lungs: Clear to auscultation. Breathing comfortably on room air Heart: Regular rate and rhythm Abdomen: Soft, nontender, mildly distended, no rebound/guarding Extremities: Warm and well perfused, no edema Skin: No rashes or lesions Neurologic: Alert and interactive. No gross deficits.  Lab Results: Basic Metabolic Panel:  Recent Labs Lab 02/21/16 0512 02/21/16 0715 02/22/16 0706  NA 135  --  134*  K 4.6  --  3.4*  CL 90*  --  95*  CO2 20*  --  25  GLUCOSE 82  --  101*  BUN 58*  --  32*  CREATININE 11.46*  --  8.55*  CALCIUM 8.5*  --  8.4*  MG  --  2.3  --   PHOS 7.7*  --  4.2   Liver Function Tests:  Recent Labs Lab 02/20/16 1346 02/21/16 0512 02/22/16 0706  AST 22 19  --   ALT 13* 13*  --   ALKPHOS 81 83  --   BILITOT 1.9* 2.0*  --   PROT 7.7 7.3  --   ALBUMIN 3.6 3.3*  3.3* 2.9*   CBC:  Recent Labs Lab 02/20/16 1346 02/21/16 0512 02/22/16 0707  WBC 6.6 6.6 4.2  NEUTROABS 5.1  --   --   HGB 12.6* 11.9* 10.2*  HCT 36.8* 34.7* 31.0*  MCV 95.1 94.3 93.9  PLT 213 196 146*    Micro Results: Recent Results (from the past 240 hour(s))  Blood culture (routine x 2)     Status: None (Preliminary result)   Collection Time: 02/20/16  3:42 PM  Result Value Ref Range Status   Specimen Description BLOOD RIGHT HAND  Final   Special Requests BOTTLES  DRAWN AEROBIC ONLY 5CC  Final   Culture NO GROWTH 4 DAYS  Final   Report Status PENDING  Incomplete  MRSA PCR Screening     Status: None   Collection Time: 02/20/16  9:53 PM  Result Value Ref Range Status   MRSA by PCR NEGATIVE NEGATIVE Final    Comment:        The GeneXpert MRSA Assay (FDA approved for NASAL specimens only), is one component of a comprehensive MRSA colonization surveillance program. It is not intended to diagnose MRSA infection nor to guide or monitor treatment for MRSA infections.   Blood culture (routine x 2)     Status: None (Preliminary result)   Collection Time: 02/20/16 11:28 PM  Result Value Ref Range Status   Specimen Description BLOOD RIGHT HAND  Final   Special Requests IN PEDIATRIC BOTTLE 2CC  Final   Culture NO GROWTH 3 DAYS  Final   Report Status PENDING  Incomplete   Studies/Results: No results found.  Medications: I have reviewed the patient's current medications. Scheduled Meds: . [MAR Hold] calcitRIOL  0.5 mcg Oral Q M,W,F-HD  . [MAR Hold] calcium acetate  2,001 mg Oral TID WC  . [MAR Hold] carvedilol  25 mg Oral BID WC  . [MAR Hold] diclofenac sodium  4 g Topical TID AC & HS  . [MAR Hold] ferric gluconate (FERRLECIT/NULECIT) IV  125 mg Intravenous Q M,W,F-HD  . [MAR Hold] heparin  5,000 Units Subcutaneous 3 times per day  . [MAR Hold] isosorbide mononitrate  60 mg Oral Daily  . [MAR Hold] losartan  25 mg Oral Daily  . [MAR Hold] pantoprazole  40 mg Oral Daily  . [MAR Hold] sodium chloride flush  3 mL Intravenous Q12H  . sodium chloride flush  3 mL Intravenous Q12H   Continuous Infusions: . sodium chloride 10 mL/hr at 02/25/16 0610   PRN Meds:.sodium chloride, [MAR Hold] acetaminophen **OR** [MAR Hold] acetaminophen, [MAR Hold] albuterol, [MAR Hold] dextromethorphan, [MAR Hold] diphenhydrAMINE, [MAR Hold] Melatonin, sodium chloride flush   Assessment/Plan: 50 year old man with ESRD on HD MWF, combined CHF (EF 40-50%; grade 2  diastolic dysfunction in 0000000), HTN, and polysubstance abuse, presenting with unclear history of back pain, hemoptysis, and missed dialysis.   Combined CHF, NSVT, and HTN: Repeat echo demonstrates EF 25-30% and grade 2 diastolic dysfunction. - Cardiology and EP consulted, appreciated recommendations. - Cath today - Coreg 25 mg BID - Losartan 100 mg - Imdur 60 mg  - Hydralazine 25mg  TID  AMS 2/2 Metabolic Derangement and ESRD, improved:  - HD per nephrology  Flank/Right Abdominal Pain, improving: Location and description of pain most c/w MSK pathology.  -Dextromethorphan 30mg  BID prn cough  Hemoptysis secondary to increased pulmonary capillary pressure due to HD noncompliance: Improved - Continue to monitor  Polysubstance Abuse:  - Cessation counseling provided  FEN: Renal diet VTE Ppx: Heparin   Dispo: Disposition is deferred at this time, awaiting improvement of current medical problems.  Anticipated discharge in approximately 0-1 day(s).     LOS: 5 days   Milagros Loll, MD 02/25/2016, 10:13 AM

## 2016-02-25 NOTE — Interval H&P Note (Signed)
History and Physical Interval Note:  02/25/2016 8:28 AM  [name] [name]  has presented today for cardiac cath with the diagnosis of cardiomyopathy.  The various methods of treatment have been discussed with the patient and family. After consideration of risks, benefits and other options for treatment, the patient has consented to  Procedure(s): Right/Left Heart Cath and Coronary Angiography (N/A) as a surgical intervention .  The patient's history has been reviewed, patient examined, no change in status, stable for surgery.  I have reviewed the patient's chart and labs.  Questions were answered to the patient's satisfaction.     [name]

## 2016-02-25 NOTE — Progress Notes (Signed)
Site area: Right groin a 6 french arterial and a 7 french venous sheath was removed  Site Prior to Removal:  Level 0  Pressure Applied For 15 MINUTES    Minutes Beginning at 0940a  Manual:   Yes.    Patient Status During Pull:  stable  Post Pull Groin Site:  Level 0  Post Pull Instructions Given:  Yes.    Post Pull Pulses Present:  Yes.    Dressing Applied:  Yes.    Comments:  VS remain stable during sheath pull

## 2016-02-26 ENCOUNTER — Encounter (HOSPITAL_COMMUNITY): Payer: Self-pay | Admitting: Cardiovascular Disease

## 2016-02-26 LAB — BASIC METABOLIC PANEL
Anion gap: 16 — ABNORMAL HIGH (ref 5–15)
BUN: 30 mg/dL — AB (ref 6–20)
CO2: 26 mmol/L (ref 22–32)
CREATININE: 6.74 mg/dL — AB (ref 0.61–1.24)
Calcium: 8.7 mg/dL — ABNORMAL LOW (ref 8.9–10.3)
Chloride: 98 mmol/L — ABNORMAL LOW (ref 101–111)
GFR calc Af Amer: 10 mL/min — ABNORMAL LOW (ref >60)
GFR, EST NON AFRICAN AMERICAN: 9 mL/min — AB (ref >60)
GLUCOSE: 87 mg/dL (ref 65–99)
POTASSIUM: 4 mmol/L (ref 3.5–5.1)
SODIUM: 140 mmol/L (ref 135–145)

## 2016-02-26 LAB — CBC
HCT: 33.7 % — ABNORMAL LOW (ref 39.0–52.0)
Hemoglobin: 10.8 g/dL — ABNORMAL LOW (ref 13.0–17.0)
MCH: 31.1 pg (ref 26.0–34.0)
MCHC: 32 g/dL (ref 30.0–36.0)
MCV: 97.1 fL (ref 78.0–100.0)
PLATELETS: 140 10*3/uL — AB (ref 150–400)
RBC: 3.47 MIL/uL — ABNORMAL LOW (ref 4.22–5.81)
RDW: 17.7 % — ABNORMAL HIGH (ref 11.5–15.5)
WBC: 4.7 10*3/uL (ref 4.0–10.5)

## 2016-02-26 LAB — POCT ACTIVATED CLOTTING TIME: ACTIVATED CLOTTING TIME: 142 s

## 2016-02-26 LAB — CULTURE, BLOOD (ROUTINE X 2): Culture: NO GROWTH

## 2016-02-26 MED ORDER — DEXTROMETHORPHAN POLISTIREX ER 30 MG/5ML PO SUER: 30.0000 mg | Freq: Two times a day (BID) | ORAL | Status: DC | PRN

## 2016-02-26 MED ORDER — LOSARTAN POTASSIUM 100 MG PO TABS: 100.0000 mg | ORAL_TABLET | Freq: Every day | ORAL | Status: DC

## 2016-02-26 MED ORDER — CARVEDILOL 12.5 MG PO TABS: 25.0000 mg | ORAL_TABLET | Freq: Two times a day (BID) | ORAL | Status: DC

## 2016-02-26 NOTE — Progress Notes (Signed)
Internal Medicine Attending  Date: 02/26/2016  Patient name: [name] [name] Medical record number: [id number] Date of birth: 06-12-1966 Age: 50 y.o. Gender: male  I saw and evaluated the patient. I reviewed the resident's note by Dr. Lovena Le and I agree with the resident's findings and plans as documented in his progress note.  [name] [name] feels well today and is ready for discharge home. We discussed the importance of cocaine cessation given this is the likely cause of his heart failure. We will back off on his afterload reducers in order to assist dialysis in removing more fluid given his left ventricular end-diastolic pressure and pulmonary capillary wedge pressure being 45 mmHg. Follow-up will be in the [place] [facility] as well as in his chronic dialysis unit.

## 2016-02-26 NOTE — Care Management Important Message (Signed)
Important Message  Patient Details  Name: [name] [name] MRN: [id number] Date of Birth: May 19, 1966   Medicare Important Message Given:  Yes    Barb Merino [place] 02/26/2016, 12:33 PM

## 2016-02-26 NOTE — Progress Notes (Signed)
[facility] Progress Note  Assessment/Plan: 1. Hyperkalemia - resolved 2. ESRD - MWF at [facility]. K+4.0 Last tx 02/25/16. Possible DC today.  3. NSVT- EP saw pt. Now in SR.  4 Hx systolic CHF/ CM EF [id number] NSVT - L heart cath 02/25/16 showed No angiographic evidence of CAD, moderate pulmonary HTN and elevated LV pressures.  5 Anemia - hgb 10.8 no ESA, follow hgb, cont Fe load 6. Secondary hyperparathyroidism - Resume calcitriol/ ca / phos stable 7 HTN/volume - HD 02/25/16 Net UF 2514 post wt 78.9. BP ok during HD. Continue to lower EDW.  8. Nutrition - renal diet/vit 9. Back pain - per admit tem - had neg CT angio and abd CT 10. Noncompliance with HD - attendance variable - often signs off early 34. Polysubstance abuse -playing large component to his problems = htn /cardiac arrhythmias/ missing HD   Disposition: Possible DC today  Rita H. Brown NP-C 02/26/2016, 10:28 AM  [facility] [phone]  Renal Attending: I agree with note above.  I remain concerned about his adherence to treatment and have discussed this with him. Anesia Blackwell C   Subjective:  "I feel great" Sitting up in chair, apparently just spoke with cardiology, excited about heart cath results.   Objective Filed Vitals:   02/25/16 1852 02/25/16 2246 02/26/16 0626 02/26/16 0853  BP: 129/96 112/76 131/89 116/81  Pulse: 66 60 63 66  Temp: 98 F (36.7 C) 98.8 F (37.1 C) 98.4 F (36.9 C) 98.6 F (37 C)  TempSrc: Oral   Oral  Resp: 18 20 18 16   Height:      Weight:      SpO2: 100% 100% 98% 99%   Physical Exam General: Well nourished, NAD.  Heart: S1, S2, RRR.  Lungs: Bilateral breath sounds CTA Abdomen: Soft nontender Extremities: No LE edema Dialysis Access: LFA AVF + bruit  Dialysis Orders: [place]  MonWedFri, 4 hrs 0 min, 180NRe Optiflux, BFR 400, DFR Autoflow 1.5, EDW 77 (kg), Dialysate 2.0 K, 2.0 Ca, UFR Profile: None, Sodium Model: None, Access: LFA AV  Fistula Mircera: None Heparin: 5000 units per treatment Calcitriol: 0.5 mcg PO q MWF  Additional Objective Labs: Basic Metabolic Panel:  Recent Labs Lab 02/21/16 0512 02/22/16 0706 02/25/16 1401 02/26/16 0542  NA 135 134* 136 140  K 4.6 3.4* 4.1 4.0  CL 90* 95* 98* 98*  CO2 20* 25 21* 26  GLUCOSE 82 101* 130* 87  BUN 58* 32* 61* 30*  CREATININE 11.46* 8.55* 10.74* 6.74*  CALCIUM 8.5* 8.4* 8.7* 8.7*  PHOS 7.7* 4.2 5.8*  --    Liver Function Tests:  Recent Labs Lab 02/20/16 1346 02/21/16 0512 02/22/16 0706 02/25/16 1401  AST 22 19  --   --   ALT 13* 13*  --   --   ALKPHOS 81 83  --   --   BILITOT 1.9* 2.0*  --   --   PROT 7.7 7.3  --   --   ALBUMIN 3.6 3.3*  3.3* 2.9* 3.0*    Recent Labs Lab 02/20/16 1623  LIPASE 19   CBC:  Recent Labs Lab 02/20/16 1346 02/21/16 0512 02/22/16 0707 02/25/16 1401 02/26/16 0542  WBC 6.6 6.6 4.2 4.6 4.7  NEUTROABS 5.1  --   --   --   --   HGB 12.6* 11.9* 10.2* 10.5* 10.8*  HCT 36.8* 34.7* 31.0* 32.3* 33.7*  MCV 95.1 94.3 93.9 95.6 97.1  PLT 213 196 146* 152 140*  Blood Culture    Component Value Date/Time   SDES BLOOD RIGHT HAND 02/20/2016 2328   SPECREQUEST IN PEDIATRIC BOTTLE 2CC 02/20/2016 2328   CULT NO GROWTH 4 DAYS 02/20/2016 2328   REPTSTATUS PENDING 02/20/2016 2328    Cardiac Enzymes: No results for input(s): CKTOTAL, CKMB, CKMBINDEX, TROPONINI in the last 168 hours. CBG:  Recent Labs Lab 02/20/16 1500 02/20/16 1532 02/21/16 1239 02/21/16 1702 02/22/16 2235  GLUCAP 141* 167* 109* 101* 137*   Iron Studies: No results for input(s): IRON, TIBC, TRANSFERRIN, FERRITIN in the last 72 hours. @lablastinr3 @ Studies/Results: No results found. Medications:   . calcitRIOL  0.5 mcg Oral Q M,W,F-HD  . calcium acetate  2,001 mg Oral TID WC  . carvedilol  25 mg Oral BID WC  . diclofenac sodium  4 g Topical TID AC & HS  . ferric gluconate (FERRLECIT/NULECIT) IV  125 mg Intravenous Q M,W,F-HD  .  isosorbide mononitrate  60 mg Oral Daily  . losartan  25 mg Oral Daily  . pantoprazole  40 mg Oral Daily  . sodium chloride flush  3 mL Intravenous Q12H  . sodium chloride flush  3 mL Intravenous Q12H  . sodium chloride flush  3 mL Intravenous Q12H

## 2016-02-26 NOTE — Progress Notes (Signed)
Subjective: No acute events overnight. No chest pain, palpitations, or SOB.  He feels good and wants to go home.  He understands that cocaine has likely led to his heart failure and he states he will stop using.  Objective: Vital signs in last 24 hours: Filed Vitals:   02/25/16 1739 02/25/16 1852 02/25/16 2246 02/26/16 0626  BP: 107/86 129/96 112/76 131/89  Pulse: 68 66 60 63  Temp: 98.5 F (36.9 C) 98 F (36.7 C) 98.8 F (37.1 C) 98.4 F (36.9 C)  TempSrc: Oral Oral    Resp: 19 18 20 18   Height:      Weight: 173 lb 15.1 oz (78.9 kg)     SpO2: 100% 100% 100% 98%   Weight change: 6 lb 2.8 oz (2.8 kg)  Intake/Output Summary (Last 24 hours) at 02/26/16 0844 Last data filed at 02/25/16 2132  Gross per 24 hour  Intake    240 ml  Output   2514 ml  Net  -2274 ml   General Apperance: NAD HEENT: Normocephalic, atraumatic Neck: Supple, trachea midline Lungs: Clear to auscultation. Breathing comfortably on room air Heart: Regular rate and rhythm Abdomen: Soft, nontender, mildly distended, no rebound/guarding Extremities: Warm and well perfused, no edema Skin: No rashes or lesions Neurologic: Alert and interactive. No gross deficits.  Lab Results: Basic Metabolic Panel:  Recent Labs Lab 02/21/16 0715 02/22/16 0706 02/25/16 1401 02/26/16 0542  NA  --  134* 136 140  K  --  3.4* 4.1 4.0  CL  --  95* 98* 98*  CO2  --  25 21* 26  GLUCOSE  --  101* 130* 87  BUN  --  32* 61* 30*  CREATININE  --  8.55* 10.74* 6.74*  CALCIUM  --  8.4* 8.7* 8.7*  MG 2.3  --   --   --   PHOS  --  4.2 5.8*  --    Liver Function Tests:  Recent Labs Lab 02/20/16 1346 02/21/16 0512 02/22/16 0706 02/25/16 1401  AST 22 19  --   --   ALT 13* 13*  --   --   ALKPHOS 81 83  --   --   BILITOT 1.9* 2.0*  --   --   PROT 7.7 7.3  --   --   ALBUMIN 3.6 3.3*  3.3* 2.9* 3.0*   CBC:  Recent Labs Lab 02/20/16 1346  02/25/16 1401 02/26/16 0542  WBC 6.6  < > 4.6 4.7  NEUTROABS 5.1  --   --    --   HGB 12.6*  < > 10.5* 10.8*  HCT 36.8*  < > 32.3* 33.7*  MCV 95.1  < > 95.6 97.1  PLT 213  < > 152 140*  < > = values in this interval not displayed.  Micro Results: Recent Results (from the past 240 hour(s))  Blood culture (routine x 2)     Status: None   Collection Time: 02/20/16  3:42 PM  Result Value Ref Range Status   Specimen Description BLOOD RIGHT HAND  Final   Special Requests BOTTLES DRAWN AEROBIC ONLY 5CC  Final   Culture NO GROWTH 5 DAYS  Final   Report Status 02/25/2016 FINAL  Final  MRSA PCR Screening     Status: None   Collection Time: 02/20/16  9:53 PM  Result Value Ref Range Status   MRSA by PCR NEGATIVE NEGATIVE Final    Comment:        The GeneXpert MRSA  Assay (FDA approved for NASAL specimens only), is one component of a comprehensive MRSA colonization surveillance program. It is not intended to diagnose MRSA infection nor to guide or monitor treatment for MRSA infections.   Blood culture (routine x 2)     Status: None (Preliminary result)   Collection Time: 02/20/16 11:28 PM  Result Value Ref Range Status   Specimen Description BLOOD RIGHT HAND  Final   Special Requests IN PEDIATRIC BOTTLE 2CC  Final   Culture NO GROWTH 4 DAYS  Final   Report Status PENDING  Incomplete   Studies/Results: No results found.   Medications: I have reviewed the patient's current medications. Scheduled Meds: . calcitRIOL  0.5 mcg Oral Q M,W,F-HD  . calcium acetate  2,001 mg Oral TID WC  . carvedilol  25 mg Oral BID WC  . diclofenac sodium  4 g Topical TID AC & HS  . ferric gluconate (FERRLECIT/NULECIT) IV  125 mg Intravenous Q M,W,F-HD  . isosorbide mononitrate  60 mg Oral Daily  . losartan  25 mg Oral Daily  . pantoprazole  40 mg Oral Daily  . sodium chloride flush  3 mL Intravenous Q12H  . sodium chloride flush  3 mL Intravenous Q12H  . sodium chloride flush  3 mL Intravenous Q12H   Continuous Infusions:   PRN Meds:.sodium chloride, sodium chloride,  acetaminophen **OR** acetaminophen, albuterol, dextromethorphan, diphenhydrAMINE, Melatonin, sodium chloride flush, sodium chloride flush   Assessment/Plan: 50 year old man with ESRD on HD MWF, combined CHF (EF [id number]; grade 2 diastolic dysfunction in 0000000), HTN, and polysubstance abuse, presenting with unclear history of back pain, hemoptysis, and missed dialysis.   Combined CHF, NSVT, and HTN: Repeat echo demonstrates EF 25-30% and grade 2 diastolic dysfunction.  Cath with ischemic disease and with mean PA pressure and LVEDP 45 mmHg.  NICM likely 2/2 longstanding cocaine abuse and worsened by dialysis noncompliance.  No recommendations from EP regarding NSVT.   - Cardiology consulted, appreciated recommendations. - Coreg 25 mg BID - Losartan 100 mg - Imdur 60 mg   AMS 2/2 Metabolic Derangement, resolved   ESRD:  - HD per nephrology  Flank/Right Abdominal Pain, improving: Location and description of pain most c/w MSK pathology.  -Dextromethorphan 30mg  BID prn cough  Hemoptysis secondary to increased pulmonary capillary pressure due to HD noncompliance: Improved - Continue to monitor  Polysubstance Abuse:  - Cessation counseling provided  FEN: Renal diet VTE Ppx: Heparin [place]   Dispo: Disposition is deferred at this time, awaiting improvement of current medical problems.  Anticipated discharge in approximately 0-1 day(s).     LOS: 6 days   Iline Oven, MD 02/26/2016, 8:44 AM

## 2016-02-26 NOTE — Progress Notes (Signed)
Patient Name: [name] [name] Date of Encounter: 02/26/2016  Principal Problem:   ESRD (end stage renal disease) on dialysis [facility]) Active Problems:   Essential hypertension   Metabolic acidosis   End stage renal disease (HCC)   Hyperkalemia   Combined congestive systolic and diastolic heart failure (HCC)   Volume overload   Acute on chronic combined systolic and diastolic heart failure (HCC)   Back pain   Right-sided chest pain   Ventricular tachycardia ([place])   Non-ischemic cardiomyopathy [facility])   Primary Cardiologist: Dr Radford Pax Patient Profile: 50 yo male w/ hx ESRD on HD, S-D-CHF, HTN, polysubs abuse, admitted 03/08 w/ back pain, missed HD. Cards consulted due to NSVT, CM>>no CAD>>NICM, EF 25-30% by echo.  SUBJECTIVE: No chest pain, no palpitations, breathing well, had HD yesterday.  OBJECTIVE Filed Vitals:   02/25/16 1739 02/25/16 1852 02/25/16 2246 02/26/16 0626  BP: 107/86 129/96 112/76 131/89  Pulse: 68 66 60 63  Temp: 98.5 F (36.9 C) 98 F (36.7 C) 98.8 F (37.1 C) 98.4 F (36.9 C)  TempSrc: Oral Oral    Resp: 19 18 20 18   Height:      Weight: 173 lb 15.1 oz (78.9 kg)     SpO2: 100% 100% 100% 98%    Intake/Output Summary (Last 24 hours) at 02/26/16 0804 Last data filed at 02/25/16 2132  Gross per 24 hour  Intake    240 ml  Output   2514 ml  Net  -2274 ml   Filed Weights   02/24/16 2050 02/25/16 1325 02/25/16 1739  Weight: 176 lb 2.4 oz (79.9 kg) 182 lb 5.1 oz (82.7 kg) 173 lb 15.1 oz (78.9 kg)    PHYSICAL EXAM General: Well developed, well nourished, male in no acute distress. Head: Normocephalic, atraumatic.  Neck: Supple without bruits, JVD 11 cm. Lungs:  Resp regular and unlabored, CTA. Heart: RRR, S1, S2, no S3, S4, or murmur; no rub. Abdomen: Soft, non-tender, non-distended, BS + x 4.  Extremities: No clubbing, cyanosis, edema.  Neuro: Alert and oriented X 3. Moves all extremities spontaneously. Psych: Normal  affect.  LABS: CBC: Recent Labs  02/25/16 1401 02/26/16 0542  WBC 4.6 4.7  HGB 10.5* 10.8*  HCT 32.3* 33.7*  MCV 95.6 97.1  PLT 152 140*   INR: Recent Labs  02/25/16  INR 0000000   Basic Metabolic Panel: Recent Labs  02/25/16 1401  NA 136  K 4.1  CL 98*  CO2 21*  GLUCOSE 130*  BUN 61*  CREATININE 10.74*  CALCIUM 8.7*  PHOS 5.8*   Liver Function Tests: Recent Labs  02/25/16 1401  ALBUMIN 3.0*   TELE:   SR, S brady, 40s at times.  ?Junctional brady w/ inverted P waves before the QRS 03/13   CATH: 02/25/2016 1. No angiographic evidence of CAD 2. Moderate pulmonary HTN 3. Elevated LV filling pressures.   Recommendations: Would consider aggressive volume removal with HD.   Radiology/Studies: No results found.   Current Medications:  . calcitRIOL  0.5 mcg Oral Q M,W,F-HD  . calcium acetate  2,001 mg Oral TID WC  . carvedilol  25 mg Oral BID WC  . diclofenac sodium  4 g Topical TID AC & HS  . ferric gluconate (FERRLECIT/NULECIT) IV  125 mg Intravenous Q M,W,F-HD  . isosorbide mononitrate  60 mg Oral Daily  . losartan  25 mg Oral Daily  . pantoprazole  40 mg Oral Daily  . sodium chloride flush  3 mL Intravenous  Q12H  . sodium chloride flush  3 mL Intravenous Q12H  . sodium chloride flush  3 mL Intravenous Q12H      ASSESSMENT AND PLAN: Principal Problem: 1.  ESRD (end stage renal disease) on dialysis ([place]) - per IM  2.  Ventricular tachycardia ([place]) - none since 03/12 - PVCs seen on telemetry but no pairs or runs - continue Coreg  3.  Bradycardia - asymptomatic - would continue BB at current dose for now  4.  Non-ischemic cardiomyopathy (HCC) - on ARB, BB, Imdur - no doses missed - volume mgt with HD  Otherwise, per IM Active Problems:   Essential hypertension   Metabolic acidosis   End stage renal disease (HCC)   Hyperkalemia   Combined congestive systolic and diastolic heart failure (HCC)   Volume overload   Acute on chronic  combined systolic and diastolic heart failure (HCC)   Back pain   Right-sided chest pain  Signed, Rosaria Ferries , PA-C 8:04 AM 02/26/2016   The patient was seen, examined and discussed with Rosaria Ferries, PA-C and I agree with the above.   50 year old male with h/o cocaine abuse, non-ischemic cardiomyopathy, not\rmal coronaries by cath yesterday, started on carvedilol and losartan, fluid overload managed by HD. We will sign off and arrange a follow up in our clinic.  Dorothy Spark 02/26/2016

## 2016-02-27 DIAGNOSIS — N186 End stage renal disease: Secondary | ICD-10-CM | POA: Diagnosis not present

## 2016-02-27 DIAGNOSIS — N2581 Secondary hyperparathyroidism of renal origin: Secondary | ICD-10-CM | POA: Diagnosis not present

## 2016-02-27 DIAGNOSIS — D509 Iron deficiency anemia, unspecified: Secondary | ICD-10-CM | POA: Diagnosis not present

## 2016-02-29 DIAGNOSIS — N2581 Secondary hyperparathyroidism of renal origin: Secondary | ICD-10-CM | POA: Diagnosis not present

## 2016-02-29 DIAGNOSIS — D509 Iron deficiency anemia, unspecified: Secondary | ICD-10-CM | POA: Diagnosis not present

## 2016-02-29 DIAGNOSIS — N186 End stage renal disease: Secondary | ICD-10-CM | POA: Diagnosis not present

## 2016-03-03 DIAGNOSIS — N2581 Secondary hyperparathyroidism of renal origin: Secondary | ICD-10-CM | POA: Diagnosis not present

## 2016-03-03 DIAGNOSIS — D509 Iron deficiency anemia, unspecified: Secondary | ICD-10-CM | POA: Diagnosis not present

## 2016-03-03 DIAGNOSIS — N186 End stage renal disease: Secondary | ICD-10-CM | POA: Diagnosis not present

## 2016-03-05 DIAGNOSIS — D509 Iron deficiency anemia, unspecified: Secondary | ICD-10-CM | POA: Diagnosis not present

## 2016-03-05 DIAGNOSIS — N186 End stage renal disease: Secondary | ICD-10-CM | POA: Diagnosis not present

## 2016-03-05 DIAGNOSIS — N2581 Secondary hyperparathyroidism of renal origin: Secondary | ICD-10-CM | POA: Diagnosis not present

## 2016-03-07 DIAGNOSIS — N186 End stage renal disease: Secondary | ICD-10-CM | POA: Diagnosis not present

## 2016-03-07 DIAGNOSIS — D509 Iron deficiency anemia, unspecified: Secondary | ICD-10-CM | POA: Diagnosis not present

## 2016-03-07 DIAGNOSIS — N2581 Secondary hyperparathyroidism of renal origin: Secondary | ICD-10-CM | POA: Diagnosis not present

## 2016-03-10 DIAGNOSIS — D509 Iron deficiency anemia, unspecified: Secondary | ICD-10-CM | POA: Diagnosis not present

## 2016-03-10 DIAGNOSIS — N2581 Secondary hyperparathyroidism of renal origin: Secondary | ICD-10-CM | POA: Diagnosis not present

## 2016-03-10 DIAGNOSIS — N186 End stage renal disease: Secondary | ICD-10-CM | POA: Diagnosis not present

## 2016-03-12 DIAGNOSIS — N2581 Secondary hyperparathyroidism of renal origin: Secondary | ICD-10-CM | POA: Diagnosis not present

## 2016-03-12 DIAGNOSIS — D509 Iron deficiency anemia, unspecified: Secondary | ICD-10-CM | POA: Diagnosis not present

## 2016-03-12 DIAGNOSIS — N186 End stage renal disease: Secondary | ICD-10-CM | POA: Diagnosis not present

## 2016-03-13 ENCOUNTER — Telehealth: Payer: Self-pay

## 2016-03-13 ENCOUNTER — Institutional Professional Consult (permissible substitution): Payer: Medicare Other | Admitting: Neurology

## 2016-03-13 NOTE — Telephone Encounter (Signed)
Pt did not show for their appt with Dr. Dohmeier today.  

## 2016-03-14 ENCOUNTER — Encounter: Payer: Self-pay | Admitting: Neurology

## 2016-03-14 DIAGNOSIS — N186 End stage renal disease: Secondary | ICD-10-CM | POA: Diagnosis not present

## 2016-03-14 DIAGNOSIS — I120 Hypertensive chronic kidney disease with stage 5 chronic kidney disease or end stage renal disease: Secondary | ICD-10-CM | POA: Diagnosis not present

## 2016-03-14 DIAGNOSIS — D509 Iron deficiency anemia, unspecified: Secondary | ICD-10-CM | POA: Diagnosis not present

## 2016-03-14 DIAGNOSIS — N2581 Secondary hyperparathyroidism of renal origin: Secondary | ICD-10-CM | POA: Diagnosis not present

## 2016-03-14 DIAGNOSIS — Z992 Dependence on renal dialysis: Secondary | ICD-10-CM | POA: Diagnosis not present

## 2016-03-17 DIAGNOSIS — N186 End stage renal disease: Secondary | ICD-10-CM | POA: Diagnosis not present

## 2016-03-17 DIAGNOSIS — N2581 Secondary hyperparathyroidism of renal origin: Secondary | ICD-10-CM | POA: Diagnosis not present

## 2016-03-17 DIAGNOSIS — D509 Iron deficiency anemia, unspecified: Secondary | ICD-10-CM | POA: Diagnosis not present

## 2016-03-17 DIAGNOSIS — D631 Anemia in chronic kidney disease: Secondary | ICD-10-CM | POA: Diagnosis not present

## 2016-03-19 DIAGNOSIS — N2581 Secondary hyperparathyroidism of renal origin: Secondary | ICD-10-CM | POA: Diagnosis not present

## 2016-03-19 DIAGNOSIS — D509 Iron deficiency anemia, unspecified: Secondary | ICD-10-CM | POA: Diagnosis not present

## 2016-03-19 DIAGNOSIS — N186 End stage renal disease: Secondary | ICD-10-CM | POA: Diagnosis not present

## 2016-03-19 DIAGNOSIS — D631 Anemia in chronic kidney disease: Secondary | ICD-10-CM | POA: Diagnosis not present

## 2016-03-21 DIAGNOSIS — N2581 Secondary hyperparathyroidism of renal origin: Secondary | ICD-10-CM | POA: Diagnosis not present

## 2016-03-21 DIAGNOSIS — N186 End stage renal disease: Secondary | ICD-10-CM | POA: Diagnosis not present

## 2016-03-21 DIAGNOSIS — D509 Iron deficiency anemia, unspecified: Secondary | ICD-10-CM | POA: Diagnosis not present

## 2016-03-21 DIAGNOSIS — D631 Anemia in chronic kidney disease: Secondary | ICD-10-CM | POA: Diagnosis not present

## 2016-03-24 DIAGNOSIS — D631 Anemia in chronic kidney disease: Secondary | ICD-10-CM | POA: Diagnosis not present

## 2016-03-24 DIAGNOSIS — D509 Iron deficiency anemia, unspecified: Secondary | ICD-10-CM | POA: Diagnosis not present

## 2016-03-24 DIAGNOSIS — N186 End stage renal disease: Secondary | ICD-10-CM | POA: Diagnosis not present

## 2016-03-24 DIAGNOSIS — N2581 Secondary hyperparathyroidism of renal origin: Secondary | ICD-10-CM | POA: Diagnosis not present

## 2016-03-26 ENCOUNTER — Telehealth: Payer: Self-pay

## 2016-03-26 ENCOUNTER — Institutional Professional Consult (permissible substitution): Payer: Medicare Other | Admitting: Neurology

## 2016-03-26 DIAGNOSIS — D509 Iron deficiency anemia, unspecified: Secondary | ICD-10-CM | POA: Diagnosis not present

## 2016-03-26 DIAGNOSIS — D631 Anemia in chronic kidney disease: Secondary | ICD-10-CM | POA: Diagnosis not present

## 2016-03-26 DIAGNOSIS — N186 End stage renal disease: Secondary | ICD-10-CM | POA: Diagnosis not present

## 2016-03-26 DIAGNOSIS — N2581 Secondary hyperparathyroidism of renal origin: Secondary | ICD-10-CM | POA: Diagnosis not present

## 2016-03-26 NOTE — Telephone Encounter (Signed)
I spoke to pt. I advised him that Dr. Brett Fairy is out today for sickness and his appt needs to be rescheduled. Pt is agreeable to coming in on 4/20 at 2:00. Pt verbalized understanding.

## 2016-03-28 DIAGNOSIS — D509 Iron deficiency anemia, unspecified: Secondary | ICD-10-CM | POA: Diagnosis not present

## 2016-03-28 DIAGNOSIS — N186 End stage renal disease: Secondary | ICD-10-CM | POA: Diagnosis not present

## 2016-03-28 DIAGNOSIS — D631 Anemia in chronic kidney disease: Secondary | ICD-10-CM | POA: Diagnosis not present

## 2016-03-28 DIAGNOSIS — N2581 Secondary hyperparathyroidism of renal origin: Secondary | ICD-10-CM | POA: Diagnosis not present

## 2016-03-31 DIAGNOSIS — N2581 Secondary hyperparathyroidism of renal origin: Secondary | ICD-10-CM | POA: Diagnosis not present

## 2016-03-31 DIAGNOSIS — N186 End stage renal disease: Secondary | ICD-10-CM | POA: Diagnosis not present

## 2016-03-31 DIAGNOSIS — D631 Anemia in chronic kidney disease: Secondary | ICD-10-CM | POA: Diagnosis not present

## 2016-03-31 DIAGNOSIS — D509 Iron deficiency anemia, unspecified: Secondary | ICD-10-CM | POA: Diagnosis not present

## 2016-04-02 DIAGNOSIS — N2581 Secondary hyperparathyroidism of renal origin: Secondary | ICD-10-CM | POA: Diagnosis not present

## 2016-04-02 DIAGNOSIS — D509 Iron deficiency anemia, unspecified: Secondary | ICD-10-CM | POA: Diagnosis not present

## 2016-04-02 DIAGNOSIS — N186 End stage renal disease: Secondary | ICD-10-CM | POA: Diagnosis not present

## 2016-04-02 DIAGNOSIS — D631 Anemia in chronic kidney disease: Secondary | ICD-10-CM | POA: Diagnosis not present

## 2016-04-03 ENCOUNTER — Telehealth: Payer: Self-pay

## 2016-04-03 ENCOUNTER — Institutional Professional Consult (permissible substitution): Payer: Self-pay | Admitting: Neurology

## 2016-04-03 NOTE — Telephone Encounter (Signed)
Pt did not show for their appt with Dr. Dohmeier today.  

## 2016-04-04 ENCOUNTER — Encounter: Payer: Self-pay | Admitting: Neurology

## 2016-04-04 DIAGNOSIS — N2581 Secondary hyperparathyroidism of renal origin: Secondary | ICD-10-CM | POA: Diagnosis not present

## 2016-04-04 DIAGNOSIS — N186 End stage renal disease: Secondary | ICD-10-CM | POA: Diagnosis not present

## 2016-04-04 DIAGNOSIS — D509 Iron deficiency anemia, unspecified: Secondary | ICD-10-CM | POA: Diagnosis not present

## 2016-04-04 DIAGNOSIS — D631 Anemia in chronic kidney disease: Secondary | ICD-10-CM | POA: Diagnosis not present

## 2016-04-07 DIAGNOSIS — D509 Iron deficiency anemia, unspecified: Secondary | ICD-10-CM | POA: Diagnosis not present

## 2016-04-07 DIAGNOSIS — D631 Anemia in chronic kidney disease: Secondary | ICD-10-CM | POA: Diagnosis not present

## 2016-04-07 DIAGNOSIS — N2581 Secondary hyperparathyroidism of renal origin: Secondary | ICD-10-CM | POA: Diagnosis not present

## 2016-04-07 DIAGNOSIS — N186 End stage renal disease: Secondary | ICD-10-CM | POA: Diagnosis not present

## 2016-04-09 DIAGNOSIS — D509 Iron deficiency anemia, unspecified: Secondary | ICD-10-CM | POA: Diagnosis not present

## 2016-04-09 DIAGNOSIS — N2581 Secondary hyperparathyroidism of renal origin: Secondary | ICD-10-CM | POA: Diagnosis not present

## 2016-04-09 DIAGNOSIS — N186 End stage renal disease: Secondary | ICD-10-CM | POA: Diagnosis not present

## 2016-04-09 DIAGNOSIS — D631 Anemia in chronic kidney disease: Secondary | ICD-10-CM | POA: Diagnosis not present

## 2016-04-10 ENCOUNTER — Encounter: Payer: Self-pay | Admitting: Vascular Surgery

## 2016-04-11 DIAGNOSIS — D509 Iron deficiency anemia, unspecified: Secondary | ICD-10-CM | POA: Diagnosis not present

## 2016-04-11 DIAGNOSIS — N186 End stage renal disease: Secondary | ICD-10-CM | POA: Diagnosis not present

## 2016-04-11 DIAGNOSIS — D631 Anemia in chronic kidney disease: Secondary | ICD-10-CM | POA: Diagnosis not present

## 2016-04-11 DIAGNOSIS — N2581 Secondary hyperparathyroidism of renal origin: Secondary | ICD-10-CM | POA: Diagnosis not present

## 2016-04-13 DIAGNOSIS — N186 End stage renal disease: Secondary | ICD-10-CM | POA: Diagnosis not present

## 2016-04-13 DIAGNOSIS — I120 Hypertensive chronic kidney disease with stage 5 chronic kidney disease or end stage renal disease: Secondary | ICD-10-CM | POA: Diagnosis not present

## 2016-04-13 DIAGNOSIS — Z992 Dependence on renal dialysis: Secondary | ICD-10-CM | POA: Diagnosis not present

## 2016-04-14 DIAGNOSIS — D509 Iron deficiency anemia, unspecified: Secondary | ICD-10-CM | POA: Diagnosis not present

## 2016-04-14 DIAGNOSIS — N186 End stage renal disease: Secondary | ICD-10-CM | POA: Diagnosis not present

## 2016-04-14 DIAGNOSIS — N2581 Secondary hyperparathyroidism of renal origin: Secondary | ICD-10-CM | POA: Diagnosis not present

## 2016-04-15 ENCOUNTER — Ambulatory Visit: Payer: Medicare Other | Admitting: Vascular Surgery

## 2016-04-22 ENCOUNTER — Institutional Professional Consult (permissible substitution): Payer: Medicare Other | Admitting: Neurology

## 2016-04-22 ENCOUNTER — Telehealth: Payer: Self-pay

## 2016-04-22 NOTE — Telephone Encounter (Signed)
Pt did not show for their appt with Dr. Brett Fairy today.  This is the third no show this year.  Pt has no showed on 03/13/16, 04/03/16, and 04/22/16.  Do you want to dismiss?

## 2016-04-22 NOTE — Telephone Encounter (Signed)
3 no shows- dismissed from [facility]> CD

## 2016-04-23 ENCOUNTER — Encounter: Payer: Self-pay | Admitting: Neurology

## 2016-05-14 DIAGNOSIS — I120 Hypertensive chronic kidney disease with stage 5 chronic kidney disease or end stage renal disease: Secondary | ICD-10-CM | POA: Diagnosis not present

## 2016-05-14 DIAGNOSIS — Z992 Dependence on renal dialysis: Secondary | ICD-10-CM | POA: Diagnosis not present

## 2016-05-14 DIAGNOSIS — N186 End stage renal disease: Secondary | ICD-10-CM | POA: Diagnosis not present

## 2016-05-16 DIAGNOSIS — N186 End stage renal disease: Secondary | ICD-10-CM | POA: Diagnosis not present

## 2016-05-16 DIAGNOSIS — D509 Iron deficiency anemia, unspecified: Secondary | ICD-10-CM | POA: Diagnosis not present

## 2016-05-16 DIAGNOSIS — N2581 Secondary hyperparathyroidism of renal origin: Secondary | ICD-10-CM | POA: Diagnosis not present

## 2016-05-26 DIAGNOSIS — N186 End stage renal disease: Secondary | ICD-10-CM | POA: Diagnosis not present

## 2016-05-26 DIAGNOSIS — F419 Anxiety disorder, unspecified: Secondary | ICD-10-CM | POA: Diagnosis not present

## 2016-05-26 DIAGNOSIS — K439 Ventral hernia without obstruction or gangrene: Secondary | ICD-10-CM | POA: Diagnosis not present

## 2016-05-26 DIAGNOSIS — Z1389 Encounter for screening for other disorder: Secondary | ICD-10-CM | POA: Diagnosis not present

## 2016-05-26 DIAGNOSIS — I10 Essential (primary) hypertension: Secondary | ICD-10-CM | POA: Diagnosis not present

## 2016-05-26 DIAGNOSIS — Z125 Encounter for screening for malignant neoplasm of prostate: Secondary | ICD-10-CM | POA: Diagnosis not present

## 2016-05-26 DIAGNOSIS — Z13220 Encounter for screening for lipoid disorders: Secondary | ICD-10-CM | POA: Diagnosis not present

## 2016-05-26 DIAGNOSIS — I509 Heart failure, unspecified: Secondary | ICD-10-CM | POA: Diagnosis not present

## 2016-06-11 ENCOUNTER — Ambulatory Visit: Payer: Self-pay | Admitting: Surgery

## 2016-06-11 DIAGNOSIS — M6208 Separation of muscle (nontraumatic), other site: Secondary | ICD-10-CM | POA: Diagnosis not present

## 2016-06-11 DIAGNOSIS — K429 Umbilical hernia without obstruction or gangrene: Secondary | ICD-10-CM | POA: Diagnosis not present

## 2016-06-11 DIAGNOSIS — K4090 Unilateral inguinal hernia, without obstruction or gangrene, not specified as recurrent: Secondary | ICD-10-CM | POA: Diagnosis not present

## 2016-06-11 NOTE — H&P (Signed)
History of Present Illness [name] [name]. [name] Garis MD; 06/11/2016 5:05 PM) Patient words: hernia.  The patient is a 50 year old male who presents with an inguinal hernia. Cardiology - [facility] - [place] PCP - Dr. Jeanie Cooks  This is a 50 year old male who is dialysis dependent who presents with several months of some generalized abdominal distention. His develop some protrusion in the upper part of his abdomen whenever he tries to sit up. He reports some discomfort near his umbilicus. He has also developed a bulge in his right groin over the last 3 months that seems to be enlarging and is uncomfortable. He has no left-sided symptoms. He had a CT scan in March for other reasons and there was no sign of ventral hernia. He had a noticeable right inguinal hernia and a tiny fat-containing left inguinal hernia on that scan.  CLINICAL DATA: Sharp right mid back and frontal abdominal pain. Two days productive cough. Hemoptysis.  EXAM: CT ANGIOGRAPHY CHEST  CT ABDOMEN AND PELVIS WITH CONTRAST  TECHNIQUE: Multidetector CT imaging of the chest was performed using the standard protocol during bolus administration of intravenous contrast. Multiplanar CT image reconstructions and MIPs were obtained to evaluate the vascular anatomy. Multidetector CT imaging of the abdomen and pelvis was performed using the standard protocol during bolus administration of intravenous contrast.  CONTRAST: 169mL OMNIPAQUE IOHEXOL 350 MG/ML SOLN  COMPARISON: CT abdomen and pelvis 03/08/2014  FINDINGS: CTA CHEST FINDINGS  Technically adequate study with good opacification of the central and segmental pulmonary arteries. No focal filling defects are demonstrated. No evidence of significant pulmonary embolus.  Diffuse cardiac enlargement. Reflux of contrast material into the IVC and hepatic veins consistent with passive congestion and right heart failure. No significant lymphadenopathy in the chest. Visualized  mediastinal and axillary lymph nodes are prominent but without pathologic enlargement. These are likely reactive. Esophagus is decompressed.  Evaluation of lungs is limited due to motion artifact but there appears to be diffuse perihilar airspace disease likely representing edema although pneumonia could also have this appearance. No pleural effusions. No pneumothorax. Airways appear patent. The nodule demonstrated on previous chest x-ray from 10/09/2015 appears to correspond to callus from a left posterior rib fracture. No destructive bone lesions.  CT ABDOMEN and PELVIS FINDINGS  Cholelithiasis with mild gallbladder wall thickening. No bile duct dilatation. Liver, spleen, pancreas, adrenal glands, abdominal aorta, inferior vena cava, and retroperitoneal lymph nodes are unremarkable. There is mild diffuse abdominal and pelvic ascites. Kidneys are atrophic with heterogeneous delayed nephrograms consistent with renal insufficiency. The left renal mass seen previously is no longer present and may have been removed or resolved in the interval. No hydronephrosis in either kidney. Multiple sub cm renal cysts.  Stomach, small bowel, and colon are not abnormally distended and no discrete wall thickening is appreciated. Contrast material flows through to the colon without evidence of bowel obstruction. No free air in the abdomen.  Pelvis: Prostate gland is enlarged, measuring 4.2 cm diameter. Mild thickening of bladder wall may be due to hypertrophy or cystitis. Free fluid in the pelvis consistent with ascites. Fluid extends into a right inguinal hernia. No bowel herniation. The appendix is normal. No pelvic mass or lymphadenopathy. Small left inguinal hernia containing mostly fat. No destructive bone lesions.  Review of the MIP images confirms the above findings.  IMPRESSION: No evidence of significant pulmonary embolus. Cardiac enlargement with evidence of right heart failure.  Bilateral perihilar airspace disease probably representing edema although pneumonia could also have this appearance.  Prominent lymph nodes without pathologic enlargement, likely reactive.  Cholelithiasis with mild gallbladder wall thickening. Cholecystitis not excluded. Diffuse abdominal and pelvic ascites. Atrophic kidneys with heterogeneous delayed nephrogram consistent with renal insufficiency. Prostate enlargement.   Electronically Signed By: Lucienne Capers M.D. On: 02/21/2016 02:05   Other Problems (Ammie Eversole, LPN; 075-GRM D34-534 PM) Anxiety Disorder Back Pain Chronic Renal Failure Syndrome High blood pressure Inguinal Hernia Sleep Apnea Umbilical Hernia Repair  Past Surgical History (Ammie Eversole, LPN; 075-GRM D34-534 PM) Dialysis Shunt / Fistula  Diagnostic Studies History (Ammie Eversole, LPN; 075-GRM D34-534 PM) Colonoscopy never  Allergies (Ammie Eversole, LPN; 075-GRM QA348G PM) No Known Drug Allergies 06/11/2016  Medication History (Ammie Eversole, LPN; 075-GRM 624THL PM) Carvedilol (25MG  Tablet, Oral) Active. ROPINIRole HCl (0.5MG  Tablet, Oral) Active. Isosorbide Mononitrate ER (60MG  Tablet ER 24HR, Oral) Active. Calcium Acetate (Phos Binder) (667MG  Capsule, Oral) Active. ClonazePAM (0.5MG  Tablet, Oral) Active. Ferric Ammon Cit-B12-FA (15-0.010-0.8MG /ML Liquid, Oral) Active. Medications Reconciled  Social History (Aleatha Borer, LPN; 075-GRM D34-534 PM) Alcohol use Occasional alcohol use. Illicit drug use Recently quit drug use. No caffeine use Tobacco use Former smoker.  Family History Aleatha Borer, LPN; 075-GRM D34-534 PM) Arthritis Father. Diabetes Mellitus Mother. Hypertension Brother, Father, Mother, Sister. Prostate Cancer Father.     Review of Systems (Ammie Eversole LPN; 075-GRM D34-534 PM) General Present- Fatigue. Not Present- Appetite Loss, Chills, Fever, Night Sweats, Weight Gain and Weight  Loss. Skin Present- Non-Healing Wounds. Not Present- Change in Wart/Mole, Dryness, Hives, Jaundice, New Lesions, Rash and Ulcer. HEENT Not Present- Earache, Hearing Loss, Hoarseness, Nose Bleed, Oral Ulcers, Ringing in the Ears, Seasonal Allergies, Sinus Pain, Sore Throat, Visual Disturbances, Wears glasses/contact lenses and Yellow Eyes. Respiratory Present- Chronic Cough and Wheezing. Not Present- Bloody sputum, Difficulty Breathing and Snoring. Breast Not Present- Breast Mass, Breast Pain, Nipple Discharge and Skin Changes. Cardiovascular Present- Difficulty Breathing Lying Down, Shortness of Breath and Swelling of Extremities. Not Present- Chest Pain, Leg Cramps, Palpitations and Rapid Heart Rate. Gastrointestinal Present- Abdominal Pain and Excessive gas. Not Present- Bloating, Bloody Stool, Change in Bowel Habits, Chronic diarrhea, Constipation, Difficulty Swallowing, Gets full quickly at meals, Hemorrhoids, Indigestion, Nausea, Rectal Pain and Vomiting. Male Genitourinary Present- Impotence. Not Present- Blood in Urine, Change in Urinary Stream, Frequency, Nocturia, Painful Urination, Urgency and Urine Leakage. Musculoskeletal Present- Back Pain. Not Present- Joint Pain, Joint Stiffness, Muscle Pain, Muscle Weakness and Swelling of Extremities. Neurological Present- Weakness. Not Present- Decreased Memory, Fainting, Headaches, Numbness, Seizures, Tingling, Tremor and Trouble walking. Psychiatric Present- Anxiety. Not Present- Bipolar, Change in Sleep Pattern, Depression, Fearful and Frequent crying. Endocrine Not Present- Cold Intolerance, Excessive Hunger, Hair Changes, Heat Intolerance, Hot flashes and New Diabetes. Hematology Not Present- Blood Thinners, Easy Bruising, Excessive bleeding, Gland problems, HIV and Persistent Infections.  Vitals (Ammie Eversole LPN; 075-GRM 075-GRM PM) 06/11/2016 1:41 PM Weight: 170.8 lb Height: 69.5in Body Surface Area: 1.94 m Body Mass Index: 24.86  kg/m  Temp.: 98.66F(Oral)  BP: 126/70 (Sitting, Left Arm, Standard)      Physical Exam Rodman Key K. Paulette Rockford MD; 06/11/2016 5:07 PM)  The physical exam findings are as follows: Note:WDWN in NAD HEENT: EOMI, sclera anicteric Neck: No masses, no thyromegaly Lungs: CTA bilaterally; normal respiratory effort CV: Regular rate and rhythm; no murmurs Abd: +bowel sounds, soft, upper midline rectus diastasis when sitting up Small umbilical hernia - reducible; 1.5 cm defect Large right inguinal hernia - reducible; no left inguinal hernia on physical examination Ext: Well-perfused; no edema Skin: Warm, dry; no sign of  jaundice    Assessment & Plan [name] [name]. 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 The surgical procedure has been discussed with the patient. Potential risks, benefits, alternative treatments, and expected outcomes have been explained. All of the patient's questions at this time have been answered. The likelihood of reaching the patient's treatment goal is good. The patient understand the proposed surgical procedure and wishes to proceed.   Cardiac clearance - [facility]  Will need admission after surgery for dialysis the next day.  Will schedule for Tuesday or Thursday, since patient dialyzes MWF   [name] [name]. Georgette Dover, MD, [facility] Surgery  General/ Trauma Surgery  06/11/2016 5:11 PM

## 2016-06-13 DIAGNOSIS — Z992 Dependence on renal dialysis: Secondary | ICD-10-CM | POA: Diagnosis not present

## 2016-06-13 DIAGNOSIS — N186 End stage renal disease: Secondary | ICD-10-CM | POA: Diagnosis not present

## 2016-06-13 DIAGNOSIS — I120 Hypertensive chronic kidney disease with stage 5 chronic kidney disease or end stage renal disease: Secondary | ICD-10-CM | POA: Diagnosis not present

## 2016-06-16 DIAGNOSIS — N2581 Secondary hyperparathyroidism of renal origin: Secondary | ICD-10-CM | POA: Diagnosis not present

## 2016-06-16 DIAGNOSIS — D509 Iron deficiency anemia, unspecified: Secondary | ICD-10-CM | POA: Diagnosis not present

## 2016-06-16 DIAGNOSIS — N186 End stage renal disease: Secondary | ICD-10-CM | POA: Diagnosis not present

## 2016-06-24 ENCOUNTER — Encounter: Payer: Medicare Other | Admitting: Physician Assistant

## 2016-06-24 ENCOUNTER — Encounter: Payer: Self-pay | Admitting: Physician Assistant

## 2016-06-24 DIAGNOSIS — R0989 Other specified symptoms and signs involving the circulatory and respiratory systems: Secondary | ICD-10-CM

## 2016-06-24 NOTE — Progress Notes (Signed)
Cardiology Office Note:    Date:  06/24/2016   ID:  [name] [name], DOB 11-10-1966, MRN [id number]  PCP:  Pcp Not In System  Cardiologist:  Dr. Fransico Him   Electrophysiologist:  n/a  Referring MD: No ref. provider found   Chief Complaint  Patient presents with  . Surgical Clearance    History of Present Illness:     [name] [name] is a 50 y.o. male with a hx of HTN, ESRD on HD, polysubstance abuse including cocaine and ETOH, combined systolic and diastolic CHF, secondary hyperparathyroidism, anemia, VT in the setting of cocaine and HTN urgency in 2011, medical non-adherence.  Prior EF was 40-45% in 2013.    Admitted in 3/17 with volume overload in the setting of 2 missed dialysis sessions requiring emergent dialysis.  Echo demonstrated EF 123XX123, mod diastolic dysfunction, PASP 55 mmHg.  He was noted to have NSVT on Tele in the hospital.  Cardiac catheterization demonstrated normal coronary arteries. He was seen by electrophysiology. Continued medical therapy was recommended.    He has not been seen in our office for follow-up since his hospitalization.  Recently seen by general surgery and he needs inguinal hernia repair. Surgeon is Dr. Donnie Mesa.      Past Medical History  Diagnosis Date  . Hypertension   . CKD (chronic kidney disease) stage 4, GFR 15-29 ml/min (HCC)   . Systolic CHF (HCC)     Ef 30-35%  . Medical non-compliance   . Polysubstance abuse   . ESRD (end stage renal disease) ([place])   . Ventricular tachycardia ([place]) 02/22/2016    Past Surgical History  Procedure Laterality Date  . Insertion of dialysis catheter  07/17/2012    Procedure: INSERTION OF DIALYSIS CATHETER;  Surgeon: Conrad Chico, MD;  Location: [place];  Service: Vascular;  Laterality: Right;  right internal jugular  . Bascilic vein transposition  01/19/2013    Procedure: BASCILIC VEIN TRANSPOSITION;  Surgeon: Conrad [place], MD;  Location: [place];  Service: Vascular;  Laterality: Left;  left 2nd  stage basilic vein transposition  . Venogram N/A 08/09/2012    Procedure: VENOGRAM;  Surgeon: Conrad Sneads, MD;  Location: [facility] CATH LAB;  Service: Cardiovascular;  Laterality: N/A;  . Cardiac catheterization N/A 02/25/2016    Procedure: Right/Left Heart Cath and Coronary Angiography;  Surgeon: Burnell Blanks, MD;  Location: [place] CV LAB;  Service: Cardiovascular;  Laterality: N/A;    Current Medications: Outpatient Prescriptions Prior to Visit  Medication Sig Dispense Refill  . calcitRIOL (ROCALTROL) 0.5 MCG capsule Take 1 capsule (0.5 mcg total) by mouth every Monday, Wednesday, and Friday with hemodialysis.    Marland Kitchen calcium acetate (PHOSLO) 667 MG capsule Take 3 capsules (2,001 mg total) by mouth 3 (three) times daily with meals. 90 capsule 0  . carvedilol (COREG) 12.5 MG tablet Take 2 tablets (25 mg total) by mouth 2 (two) times daily with a meal. 30 tablet 2  . carvedilol (COREG) 12.5 MG tablet Take 2 tablets (25 mg total) by mouth 2 (two) times daily with a meal. 30 tablet 2  . dextromethorphan (DELSYM) 30 MG/5ML liquid Take 5 mLs (30 mg total) by mouth 2 (two) times daily as needed for cough. 89 mL 0  . diclofenac sodium (VOLTAREN) 1 % GEL Apply 2 g topically 4 (four) times daily -  before meals and at bedtime. 100 g 0  . ferric gluconate 125 mg in sodium chloride 0.9 % 100 mL Inject 125 mg  into the vein every Monday, Wednesday, and Friday with hemodialysis.    Marland Kitchen gabapentin (NEURONTIN) 300 MG capsule Take 300 mg by mouth 3 (three) times daily.    . isosorbide mononitrate (IMDUR) 60 MG 24 hr tablet Take 60 mg by mouth daily.    Marland Kitchen levofloxacin (LEVAQUIN) 500 MG tablet Take 500 mg by mouth daily. After dialysis on dialysis days    . losartan (COZAAR) 100 MG tablet Take 1 tablet (100 mg total) by mouth daily. 30 tablet 1  . Multiple Vitamin (MULTIVITAMIN WITH MINERALS) TABS tablet Take 1 tablet by mouth daily as needed (Nutritional supplementation). Reported on 02/21/2016    . rOPINIRole  (REQUIP) 0.5 MG tablet Take 0.5 mg by mouth 2 (two) times daily. 0.5mg  at bedtime and 0.5mg  at dialysis     No facility-administered medications prior to visit.      Allergies:   Review of patient's allergies indicates no known allergies.   Social History   Social History  . Marital Status: Single    Spouse Name: N/A  . Number of Children: N/A  . Years of Education: N/A   Social History Main Topics  . Smoking status: Current Every Day Smoker -- 0.25 packs/day    Types: Cigarettes    Last Attempt to Quit: 08/09/2012  . Smokeless tobacco: Not on file     Comment: Trying to quit; 3 cigs/last 2 weeks  . Alcohol Use: 4.2 oz/week    7 Cans of beer per week     Comment: Patient not forthcoming.  2-3 beers per week  . Drug Use: No     Comment: Marijuana  . Sexual Activity: Not Currently   Other Topics Concern  . Not on file   Social History Narrative     Family History:  The patient's family history includes Diabetes in his mother; Hypertension in his father and mother.   ROS:   Please see the history of present illness.    ROS All other systems reviewed and are negative.   Physical Exam:    VS:  There were no vitals taken for this visit.   Physical Exam  Wt Readings from Last 3 Encounters:  02/25/16 173 lb 15.1 oz (78.9 kg)  10/09/15 175 lb 6.4 oz (79.561 kg)  02/26/15 164 lb 5 oz (74.532 kg)      Studies/Labs Reviewed:     EKG:  EKG is  ordered today.  The ekg ordered today demonstrates   Recent Labs: 02/21/2016: ALT 13*; Magnesium 2.3 02/26/2016: BUN 30*; Creatinine, Ser 6.74*; Hemoglobin 10.8*; Platelets 140*; Potassium 4.0; Sodium 140   Recent Lipid Panel    Component Value Date/Time   CHOL 68 07/17/2012 0520   TRIG 103 07/17/2012 0520   HDL 33* 07/17/2012 0520   CHOLHDL 2.1 07/17/2012 0520   VLDL 21 07/17/2012 0520   LDLCALC 14 07/17/2012 0520    Additional studies/ records that were reviewed today include:    LHC 02/25/16 Normal coronary  arteries PASP 65 mmHg LVEDP 45 mmHg 1. No angiographic evidence of CAD 2. Moderate pulmonary HTN 3. Elevated LV filling pressures.   Echo 02/21/16 - Left ventricle: The cavity size was normal. Wall thickness was  increased in a pattern of mild LVH. There was mild concentric  hypertrophy. Systolic function was severely reduced. The  estimated ejection fraction was in the range of 25% to 30%.  Diffuse hypokinesis. Features are consistent with a pseudonormal  left ventricular filling pattern, with concomitant abnormal  relaxation and  increased filling pressure (grade 2 diastolic  dysfunction). Doppler parameters are consistent with high  ventricular filling pressure. - Aortic valve: Transvalvular velocity was within the normal range.  There was no stenosis. There was no regurgitation. - Mitral valve: There was mild regurgitation. - Left atrium: The atrium was severely dilated. - Right ventricle: The cavity size was mildly dilated. Wall  thickness was normal. Systolic function was moderately reduced. - Right atrium: The atrium was severely dilated. - Atrial septum: No defect or patent foramen ovale was identified  by color flow Doppler. - Tricuspid valve: There was mild regurgitation. - Pulmonic valve: There was moderate regurgitation. - Pulmonary arteries: Systolic pressure was severely increased. PA  peak pressure: 55 mm Hg (S). - Inferior vena cava: The vessel was dilated. The respirophasic  diameter changes were blunted (< 50%), consistent with elevated  central venous pressure.  Echo 8/13 - Left ventricle: The cavity size was normal. Wall thickness was increased in a pattern of severe LVH. Systolic function was mildly to moderately reduced. The estimated ejection fraction was in the range of 40% to 45%. There is akinesis of the basal-mid inferoposterior myocardium. Features are consistent with a pseudonormal left ventricular filling pattern, with  concomitant abnormal relaxation and increased filling pressure (grade 2 diastolic dysfunction). - Left atrium: The atrium was moderately dilated. - Right ventricle: The cavity size was mildly dilated. Systolic function was mildly reduced. - Right atrium: The atrium was mildly dilated. Impressions:  - Cannot R/O right atrial mass; suggest TEE or cardiac MRI to better assess.  ASSESSMENT:     1. Pre-operative cardiovascular examination   2. Chronic combined systolic and diastolic congestive heart failure ([place])   3. Non-ischemic cardiomyopathy ([place])   4. ESRD (end stage renal disease) on dialysis ([place])   5. 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 NSVT (nonsustained ventricular tachycardia) (HCC)     PLAN:     In order of problems listed above:  1.    Medication Adjustments/Labs and Tests Ordered: Current medicines are reviewed at length with the patient today.  Concerns regarding medicines are outlined above.  Medication changes, Labs and Tests ordered today are outlined in the Patient Instructions noted below. There are no Patient Instructions on file for this visit. Signed, Richardson Dopp, PA-C  06/24/2016 1:50 PM    [facility] [address], [place], [place]  [address] Phone: [phone]; Fax: [phone]     This encounter was created in error - please disregard.

## 2016-06-25 ENCOUNTER — Telehealth: Payer: Self-pay | Admitting: Internal Medicine

## 2016-06-25 NOTE — Telephone Encounter (Signed)
New message     Richardson Dopp, the PA saw the pt the office for [place] surgery id calling to make sure the pt is cleared for surgery please fax back [phone]

## 2016-06-26 NOTE — Telephone Encounter (Signed)
I called and left a message for surgery scheduling that the patient did not show for his appointment for surgical clearance yesterday.

## 2016-06-30 DIAGNOSIS — K439 Ventral hernia without obstruction or gangrene: Secondary | ICD-10-CM | POA: Diagnosis not present

## 2016-06-30 DIAGNOSIS — F419 Anxiety disorder, unspecified: Secondary | ICD-10-CM | POA: Diagnosis not present

## 2016-06-30 DIAGNOSIS — I10 Essential (primary) hypertension: Secondary | ICD-10-CM | POA: Diagnosis not present

## 2016-06-30 DIAGNOSIS — I509 Heart failure, unspecified: Secondary | ICD-10-CM | POA: Diagnosis not present

## 2016-06-30 DIAGNOSIS — N186 End stage renal disease: Secondary | ICD-10-CM | POA: Diagnosis not present

## 2016-06-30 DIAGNOSIS — F17210 Nicotine dependence, cigarettes, uncomplicated: Secondary | ICD-10-CM | POA: Diagnosis not present

## 2016-07-14 DIAGNOSIS — N186 End stage renal disease: Secondary | ICD-10-CM | POA: Diagnosis not present

## 2016-07-14 DIAGNOSIS — Z992 Dependence on renal dialysis: Secondary | ICD-10-CM | POA: Diagnosis not present

## 2016-07-14 DIAGNOSIS — I120 Hypertensive chronic kidney disease with stage 5 chronic kidney disease or end stage renal disease: Secondary | ICD-10-CM | POA: Diagnosis not present

## 2016-07-16 DIAGNOSIS — D509 Iron deficiency anemia, unspecified: Secondary | ICD-10-CM | POA: Diagnosis not present

## 2016-07-16 DIAGNOSIS — N2581 Secondary hyperparathyroidism of renal origin: Secondary | ICD-10-CM | POA: Diagnosis not present

## 2016-07-16 DIAGNOSIS — N186 End stage renal disease: Secondary | ICD-10-CM | POA: Diagnosis not present

## 2016-07-18 DIAGNOSIS — N186 End stage renal disease: Secondary | ICD-10-CM | POA: Diagnosis not present

## 2016-07-18 DIAGNOSIS — D509 Iron deficiency anemia, unspecified: Secondary | ICD-10-CM | POA: Diagnosis not present

## 2016-07-18 DIAGNOSIS — N2581 Secondary hyperparathyroidism of renal origin: Secondary | ICD-10-CM | POA: Diagnosis not present

## 2016-07-21 DIAGNOSIS — N186 End stage renal disease: Secondary | ICD-10-CM | POA: Diagnosis not present

## 2016-07-21 DIAGNOSIS — N2581 Secondary hyperparathyroidism of renal origin: Secondary | ICD-10-CM | POA: Diagnosis not present

## 2016-07-21 DIAGNOSIS — D509 Iron deficiency anemia, unspecified: Secondary | ICD-10-CM | POA: Diagnosis not present

## 2016-07-21 NOTE — Progress Notes (Signed)
Cardiology Office Note:    Date:  07/22/2016   ID:  [name] [name], DOB 1966-06-07, MRN [id number]  PCP:  Pcp Not In System  Cardiologist:  Dr. Fransico Him   Electrophysiologist:  n/a  Referring MD: No ref. provider found   Chief Complaint  Patient presents with  . Other    Surgical Clearance   History of Present Illness:    [name] [name] is a 50 y.o. male with a hx of HTN, ESRD on HD, polysubstance abuse including cocaine and ETOH, combined systolic and diastolic CHF, secondary hyperparathyroidism, anemia, VT in the setting of cocaine and HTN urgency in 2011, medical non-adherence.  Prior EF was 40-45% in 2013.    Admitted in 3/17 with volume overload in the setting of 2 missed dialysis sessions requiring emergent dialysis.  Echo demonstrated EF 123XX123, mod diastolic dysfunction, PASP 55 mmHg.  He was noted to have NSVT on Tele in the hospital.  Cardiac catheterization demonstrated normal coronary arteries. He was seen by electrophysiology. Continued medical therapy was recommended.    He has not been seen in our office for follow-up since his hospitalization.  Recently seen by general surgery and he needs inguinal hernia repair. Surgeon is Dr. Donnie Mesa. He returns for surgical clearance.  He is here alone.  He has been adherent with dialysis.  He goes every Monday, Wednesday, Friday.  He tells me that he is getting to his dry weight.  He notes a dry cough for > 1 year.  He was previously on Losartan. He thinks he stopped b/c of the cough.  He denies chest pain.  He notes dyspnea with more extreme activities.  He denies syncope.  He denies orthopnea, PND, edema. He tells me that he no longer uses cocaine.  He smokes on occasion.   Prior CV studies that were reviewed today include:    LHC 02/25/16 Normal coronary arteries PASP 65 mmHg LVEDP 45 mmHg 1. No angiographic evidence of CAD 2. Moderate pulmonary HTN 3. Elevated LV filling pressures.   Echo 02/21/16 Mild LVH, EF  123XX123, grade 2 diastolic dysfunction, mild MR, severe LAE, moderately reduced RVSF, severe RAE, mild TR, moderate PI, PASP 55 mmHg  Echo 8/13 Severe LVH, EF 40-45%, inferoposterior AK, grade 2 diastolic dysfunction, moderate LAE, mild RVE, mildly reduced RVSF, mild RAE   Past Medical History:  Diagnosis Date  . Combined congestive systolic and diastolic heart failure ([place]) 04/18/2014   A. Echo 8/13: Severe LVH, EF 40-45%, inferoposterior akinesis, grade 2 diastolic dysfunction, moderate LAE, mild RVE, mildly reduced RVSF, mild RAE; cannot rule out R atrial mass-suggest TEE or cardiac MRI  //  B. Echo 3/17: Mild LVH, EF 25-30%, diffuse HK, grade 2 diastolic dysfunction, mild MR, severe LAE,  moderately reduced RVSF, severe RAE, mild TR, moderate PI, PASP 55 mmHg    . ESRD (end stage renal disease) ([place])   . Hypertension   . Medical non-compliance   . Non-ischemic cardiomyopathy ([place])    A. R/L HC 3/17: Normal coronary arteries, moderate pulmonary hypertension (PASP 65 mmHg), elevated LV filling pressures (LVEDP 45 mmHg)   . NSVT (nonsustained ventricular tachycardia) ([place]) 02/22/2016  . Polysubstance abuse     Past Surgical History:  Procedure Laterality Date  . [place] TRANSPOSITION  01/19/2013   Procedure: BASCILIC VEIN TRANSPOSITION;  Surgeon: Conrad Babcock, MD;  Location: [place];  Service: Vascular;  Laterality: Left;  left 2nd stage basilic vein transposition  . CARDIAC CATHETERIZATION N/A 02/25/2016  Procedure: Right/Left Heart Cath and Coronary Angiography;  Surgeon: Burnell Blanks, MD;  Location: [place] CV LAB;  Service: Cardiovascular;  Laterality: N/A;  . INSERTION OF DIALYSIS CATHETER  07/17/2012   Procedure: INSERTION OF DIALYSIS CATHETER;  Surgeon: Conrad Moran, MD;  Location: [place];  Service: Vascular;  Laterality: Right;  right internal jugular  . VENOGRAM N/A 08/09/2012   Procedure: VENOGRAM;  Surgeon: Conrad Mulford, MD;  Location: [facility] CATH LAB;  Service:  Cardiovascular;  Laterality: N/A;    Current Medications: Outpatient Medications Prior to Visit  Medication Sig Dispense Refill  . calcitRIOL (ROCALTROL) 0.5 MCG capsule Take 1 capsule (0.5 mcg total) by mouth every Monday, Wednesday, and Friday with hemodialysis.    Marland Kitchen calcium acetate (PHOSLO) 667 MG capsule Take 3 capsules (2,001 mg total) by mouth 3 (three) times daily with meals. 90 capsule 0  . isosorbide mononitrate (IMDUR) 60 MG 24 hr tablet Take 60 mg by mouth daily.    . carvedilol (COREG) 12.5 MG tablet Take 2 tablets (25 mg total) by mouth 2 (two) times daily with a meal. 30 tablet 2  . carvedilol (COREG) 12.5 MG tablet Take 2 tablets (25 mg total) by mouth 2 (two) times daily with a meal. (Patient not taking: Reported on 07/22/2016) 30 tablet 2  . dextromethorphan (DELSYM) 30 MG/5ML liquid Take 5 mLs (30 mg total) by mouth 2 (two) times daily as needed for cough. (Patient not taking: Reported on 07/22/2016) 89 mL 0  . diclofenac sodium (VOLTAREN) 1 % GEL Apply 2 g topically 4 (four) times daily -  before meals and at bedtime. (Patient not taking: Reported on 07/22/2016) 100 g 0  . ferric gluconate 125 mg in sodium chloride 0.9 % 100 mL Inject 125 mg into the vein every Monday, Wednesday, and Friday with hemodialysis. (Patient not taking: Reported on 07/22/2016)    . gabapentin (NEURONTIN) 300 MG capsule Take 300 mg by mouth 3 (three) times daily.    Marland Kitchen levofloxacin (LEVAQUIN) 500 MG tablet Take 500 mg by mouth daily. After dialysis on dialysis days    . losartan (COZAAR) 100 MG tablet Take 1 tablet (100 mg total) by mouth daily. (Patient not taking: Reported on 07/22/2016) 30 tablet 1  . Multiple Vitamin (MULTIVITAMIN WITH MINERALS) TABS tablet Take 1 tablet by mouth daily as needed (Nutritional supplementation). Reported on 02/21/2016    . rOPINIRole (REQUIP) 0.5 MG tablet Take 0.5 mg by mouth 2 (two) times daily. 0.5mg  at bedtime and 0.5mg  at dialysis     No facility-administered medications  prior to visit.       Allergies:   Review of patient's allergies indicates no known allergies.   Social History   Social History  . Marital status: Single    Spouse name: N/A  . Number of children: N/A  . Years of education: N/A   Social History Main Topics  . Smoking status: Current Every Day Smoker    Packs/day: 0.25    Types: Cigarettes    Last attempt to quit: 08/09/2012  . Smokeless tobacco: Current User     Comment: Trying to quit; 3 cigs/last 2 weeks  . Alcohol use 4.2 oz/week    7 Cans of beer per week     Comment: Patient not forthcoming.  2-3 beers per week  . Drug use: No     Comment: Marijuana  . Sexual activity: Not Currently   Other Topics Concern  . None   Social History Narrative  .  None     Family History:  The patient's family history includes Diabetes in his mother; Hypertension in his father and mother.   ROS:   Please see the history of present illness.    Review of Systems  Respiratory: Positive for cough and wheezing.   Psychiatric/Behavioral: The patient is nervous/anxious.    All other systems reviewed and are negative.   EKGs/Labs/Other Test Reviewed:    EKG:  EKG is  ordered today.  The ekg ordered today demonstrates sinus bradycardia, HR 59, right axis deviation, first-degree AV block, PR interval 236 ms, QTC 477 ms, T-wave inversions V5-V6, poor R-wave progression, no significant change when compared to prior tracing  Recent Labs: 02/21/2016: ALT 13; Magnesium 2.3 02/26/2016: BUN 30; Creatinine, Ser 6.74; Hemoglobin 10.8; Platelets 140; Potassium 4.0; Sodium 140   Recent Lipid Panel    Component Value Date/Time   CHOL 68 07/17/2012 0520   TRIG 103 07/17/2012 0520   HDL 33 (L) 07/17/2012 0520   CHOLHDL 2.1 07/17/2012 0520   VLDL 21 07/17/2012 0520   LDLCALC 14 07/17/2012 0520     Physical Exam:    VS:  BP 120/70   Pulse 60   Ht 5\' 9"  (1.753 m)   Wt 173 lb 12.8 oz (78.8 kg)   BMI 25.67 kg/m     Wt Readings from Last 3  Encounters:  07/22/16 173 lb 12.8 oz (78.8 kg)  02/25/16 173 lb 15.1 oz (78.9 kg)  10/09/15 175 lb 6.4 oz (79.6 kg)     Physical Exam  Constitutional: He is oriented to person, place, and time. He appears well-developed and well-nourished. No distress.  HENT:  Head: Normocephalic and atraumatic.  Eyes: No scleral icterus.  Neck: Normal range of motion. No JVD present.  Cardiovascular: Normal rate, regular rhythm, S1 normal, S2 normal and normal heart sounds.  Exam reveals no gallop and no friction rub.   No murmur heard. Pulmonary/Chest: Effort normal. He has decreased breath sounds. He has no wheezes. He has no rhonchi. He has no rales.  Faint crackles bilateral bases  Abdominal: Soft. He exhibits distension. He exhibits no mass. There is no tenderness.  Musculoskeletal: Normal range of motion. He exhibits no edema.  Lymphadenopathy:    He has no cervical adenopathy.  Neurological: He is alert and oriented to person, place, and time.  Skin: Skin is warm and dry.  Psychiatric: He has a normal mood and affect.    ASSESSMENT:    1. Pre-operative cardiovascular examination   2. Chronic combined systolic and diastolic congestive heart failure ([place])   3. Non-ischemic cardiomyopathy ([place])   4. ESRD (end stage renal disease) on dialysis ([place])   5. 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 NSVT (nonsustained ventricular tachycardia) (HCC)    PLAN:    In order of problems listed above:  1. Surgical clearance - Patient needs R inguinal hernia repair.  He has a hx of non-ischemic cardiomyopathy, combined systolic and diastolic CHF and VT.  LHC in 3/17 demonstrated no CAD.  He is dependent on dialysis for volume management.  He has a hx of non-adherence. However, he notes compliance with dialysis.  Given his comorbid illnesses, he is mod risk at best for any procedure requiring gen anesthesia.  I reviewed his case today with Dr. Radford Pax.   He is ok to proceed as long as he and his surgeon are agreeable to the risk. He will need dialysis  at the time of his procedure.    2. Combined systolic and diastolic CHF - Volume management per dialysis.  Continue beta blocker, nitrates. Will try to resume ARB.  I have asked him to clear this with nephrology first.  -  If ok with renal, start Losartan 25 mg QD  3. NICM - Likely related to HTN +/- substance abuse.  His LHC in 3/17 was neg for CAD.  Continue beta blocker, nitrates. Try to resume ARB.  Eventually plan repeat echo in next few mos.  If EF remains < 35%, refer back to Dr. Caryl Comes for consideration of ICD.  4. ESRD - He goes to dialysis on M, W, F.  5. HTN - Controlled.   6. NSVT - Patient was seen by EP in the hospital earlier this year (Dr. Virl Axe). ICD implant was felt to be complicated given ESRD, substance abuse and non-adherence.  As his DCM is non-ischemic, it puts him in a different risk category and therefore, medical Rx was planned.  Continue beta blocker.     Medication Adjustments/Labs and Tests Ordered: Current medicines are reviewed at length with the patient today.  Concerns regarding medicines are outlined above.  Medication changes, Labs and Tests ordered today are outlined in the Patient Instructions noted below. Patient Instructions  Medication Instructions:  I have sent Losartan 25 mg Once daily to your pharmacy.  DO NOT START this until you ask the kidney doctor at dialysis. Take this paper to dialysis tomorrow and ask the kidney doctor if it is ok to start Losartan 25 mg Once daily - if they say no, do not take and let us know.  Labwork: None   Testing/Procedures: None   Follow-Up: 08/19/16 @ 10:45 WITH MICHELLE LENZE, PAC SAME DAY DR. Radford Pax IS IN THE OFFICE  Any Other Special Instructions Will Be Listed Below (If Applicable).  If you need a refill on your cardiac medications before your next appointment, please call your pharmacy.    Signed, Richardson Dopp, PA-C  07/22/2016 12:51 PM    [facility] [place], [place], [place]  [address] Phone: [phone]; Fax: [phone]

## 2016-07-22 ENCOUNTER — Encounter: Payer: Self-pay | Admitting: Physician Assistant

## 2016-07-22 ENCOUNTER — Encounter (INDEPENDENT_AMBULATORY_CARE_PROVIDER_SITE_OTHER): Payer: Self-pay

## 2016-07-22 ENCOUNTER — Ambulatory Visit (INDEPENDENT_AMBULATORY_CARE_PROVIDER_SITE_OTHER): Payer: Medicare Other | Admitting: Physician Assistant

## 2016-07-22 VITALS — BP 120/70 | HR 60 | Ht 69.0 in | Wt 173.8 lb

## 2016-07-22 DIAGNOSIS — I429 Cardiomyopathy, unspecified: Secondary | ICD-10-CM

## 2016-07-22 DIAGNOSIS — I472 Ventricular tachycardia: Secondary | ICD-10-CM | POA: Diagnosis not present

## 2016-07-22 DIAGNOSIS — Z992 Dependence on renal dialysis: Secondary | ICD-10-CM

## 2016-07-22 DIAGNOSIS — I509 Heart failure, unspecified: Secondary | ICD-10-CM

## 2016-07-22 DIAGNOSIS — I428 Other cardiomyopathies: Secondary | ICD-10-CM

## 2016-07-22 DIAGNOSIS — I132 Hypertensive heart and chronic kidney disease with heart failure and with stage 5 chronic kidney disease, or end stage renal disease: Secondary | ICD-10-CM

## 2016-07-22 DIAGNOSIS — I5042 Chronic combined systolic (congestive) and diastolic (congestive) heart failure: Secondary | ICD-10-CM | POA: Diagnosis not present

## 2016-07-22 DIAGNOSIS — Z01810 Encounter for preprocedural cardiovascular examination: Secondary | ICD-10-CM | POA: Diagnosis not present

## 2016-07-22 DIAGNOSIS — N186 End stage renal disease: Secondary | ICD-10-CM

## 2016-07-22 DIAGNOSIS — I4729 Other ventricular tachycardia: Secondary | ICD-10-CM

## 2016-07-22 MED ORDER — LOSARTAN POTASSIUM 25 MG PO TABS: 25.0000 mg | ORAL_TABLET | Freq: Every day | ORAL | 3 refills | Status: DC

## 2016-07-22 NOTE — Patient Instructions (Addendum)
Medication Instructions:  I have sent Losartan 25 mg Once daily to your pharmacy.  DO NOT START this until you ask the kidney doctor at dialysis. Take this paper to dialysis tomorrow and ask the kidney doctor if it is ok to start Losartan 25 mg Once daily - if they say no, do not take and let us know.  Labwork: None   Testing/Procedures: None   Follow-Up: 08/19/16 @ 10:45 WITH MICHELLE LENZE, PAC SAME DAY DR. Radford Pax IS IN THE OFFICE  Any Other Special Instructions Will Be Listed Below (If Applicable).  If you need a refill on your cardiac medications before your next appointment, please call your pharmacy.

## 2016-07-23 DIAGNOSIS — N2581 Secondary hyperparathyroidism of renal origin: Secondary | ICD-10-CM | POA: Diagnosis not present

## 2016-07-23 DIAGNOSIS — N186 End stage renal disease: Secondary | ICD-10-CM | POA: Diagnosis not present

## 2016-07-23 DIAGNOSIS — D509 Iron deficiency anemia, unspecified: Secondary | ICD-10-CM | POA: Diagnosis not present

## 2016-07-25 DIAGNOSIS — D509 Iron deficiency anemia, unspecified: Secondary | ICD-10-CM | POA: Diagnosis not present

## 2016-07-25 DIAGNOSIS — N186 End stage renal disease: Secondary | ICD-10-CM | POA: Diagnosis not present

## 2016-07-25 DIAGNOSIS — N2581 Secondary hyperparathyroidism of renal origin: Secondary | ICD-10-CM | POA: Diagnosis not present

## 2016-07-28 DIAGNOSIS — N186 End stage renal disease: Secondary | ICD-10-CM | POA: Diagnosis not present

## 2016-07-28 DIAGNOSIS — N2581 Secondary hyperparathyroidism of renal origin: Secondary | ICD-10-CM | POA: Diagnosis not present

## 2016-07-28 DIAGNOSIS — D509 Iron deficiency anemia, unspecified: Secondary | ICD-10-CM | POA: Diagnosis not present

## 2016-07-30 DIAGNOSIS — N2581 Secondary hyperparathyroidism of renal origin: Secondary | ICD-10-CM | POA: Diagnosis not present

## 2016-07-30 DIAGNOSIS — N186 End stage renal disease: Secondary | ICD-10-CM | POA: Diagnosis not present

## 2016-07-30 DIAGNOSIS — D509 Iron deficiency anemia, unspecified: Secondary | ICD-10-CM | POA: Diagnosis not present

## 2016-08-01 DIAGNOSIS — N186 End stage renal disease: Secondary | ICD-10-CM | POA: Diagnosis not present

## 2016-08-01 DIAGNOSIS — N2581 Secondary hyperparathyroidism of renal origin: Secondary | ICD-10-CM | POA: Diagnosis not present

## 2016-08-01 DIAGNOSIS — D509 Iron deficiency anemia, unspecified: Secondary | ICD-10-CM | POA: Diagnosis not present

## 2016-08-01 NOTE — Pre-Procedure Instructions (Signed)
Tarel Wandell  08/01/2016     Your procedure is scheduled on : Tuesday August 05, 2016 at 7:30 AM.  Report to [facility] Admitting at 5:30 AM.  Call this number if you have problems the morning of surgery: [phone]    Remember:  Do not eat food or drink liquids after midnight.  Take these medicines the morning of surgery with A SIP OF WATER : Carvedilol (Coreg), Isosorbide (Imdur)   Stop taking any vitamins, herbal medications/supplements, NSAIDs, Ibuprofen, Advil, Motrin, Aleve, etc today   Do not wear jewelry.  Do not wear lotions, powders, or cologne.    Men may shave face and neck.  Do not bring valuables to the hospital.  [facility] is not responsible for any belongings or valuables.  Contacts, dentures or bridgework may not be worn into surgery.  Leave your suitcase in the car.  After surgery it may be brought to your room.  For patients admitted to the hospital, discharge time will be determined by your treatment team.  Patients discharged the day of surgery will not be allowed to drive home.   Name and phone number of your driver:    Special instructions:  Shower using CHG soap the night before and the morning of your surgery  Please read over the following fact sheets that you were given.

## 2016-08-04 ENCOUNTER — Encounter (HOSPITAL_COMMUNITY): Payer: Self-pay

## 2016-08-04 ENCOUNTER — Encounter (HOSPITAL_COMMUNITY)
Admission: RE | Admit: 2016-08-04 | Discharge: 2016-08-04 | Disposition: A | Discharge status: Home / Self Care | Payer: Medicare Other | Source: Ambulatory Visit | Attending: Surgery | Admitting: Surgery

## 2016-08-04 DIAGNOSIS — N2581 Secondary hyperparathyroidism of renal origin: Secondary | ICD-10-CM | POA: Diagnosis not present

## 2016-08-04 DIAGNOSIS — I5040 Unspecified combined systolic (congestive) and diastolic (congestive) heart failure: Secondary | ICD-10-CM | POA: Diagnosis not present

## 2016-08-04 DIAGNOSIS — Z992 Dependence on renal dialysis: Secondary | ICD-10-CM | POA: Diagnosis not present

## 2016-08-04 DIAGNOSIS — F17210 Nicotine dependence, cigarettes, uncomplicated: Secondary | ICD-10-CM | POA: Diagnosis not present

## 2016-08-04 DIAGNOSIS — D509 Iron deficiency anemia, unspecified: Secondary | ICD-10-CM | POA: Diagnosis not present

## 2016-08-04 DIAGNOSIS — I132 Hypertensive heart and chronic kidney disease with heart failure and with stage 5 chronic kidney disease, or end stage renal disease: Secondary | ICD-10-CM | POA: Diagnosis not present

## 2016-08-04 DIAGNOSIS — N186 End stage renal disease: Secondary | ICD-10-CM | POA: Diagnosis not present

## 2016-08-04 DIAGNOSIS — K4090 Unilateral inguinal hernia, without obstruction or gangrene, not specified as recurrent: Secondary | ICD-10-CM | POA: Diagnosis not present

## 2016-08-04 HISTORY — DX: Dependence on renal dialysis: Z99.2

## 2016-08-04 HISTORY — DX: Restless legs syndrome: G25.81

## 2016-08-04 NOTE — Progress Notes (Signed)
Anesthesia Chart Review: Patient is a 50 year old male scheduled for right inguinal hernia repair, umbilical hernia repair on 08/05/16 by Dr. Georgette Dover.  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  - Admitted in 02/20/16-02/26/16 with volume overload in the setting of 2 missed dialysis sessions requiring emergent dialysis. Echo demonstrated EF 123XX123, mod diastolic dysfunction, PASP 55 mmHg. He was noted to have NSVT on Tele in the hospital. Cardiac catheterization demonstrated normal coronary arteries. He was seen by electrophysiology (Dr. Caryl Comes) for NSVT, cardiomyopathy. Question of future ICD could be considered but very complicated in a non-compliant patient with ESRD and polysubstance abuse, so continued medical therapy recommended for now.  Primary cardiologist is Dr. Fransico Him. He was seen by Richardson Dopp, PA-C on 07/22/16 and felt to be moderate risk at best for any procedure requiring general anesthesia. He reviewed with Dr. Radford Pax who also felt "ok to proceed as long as he and his surgeon are agreeable to the risk. He will need dialysis at the time of his procedure."  Meds include calcitriol, Phoslo, Coreg, clonazepam, Imdur, losartan.   BP 116/79   Pulse 88   Temp 36.9 C   Resp 20   Ht 5\' 9"  (1.753 m)   Wt 169 lb 12.8 oz (77 kg)   SpO2 95%   BMI 25.08 kg/m   07/22/16 EKG: SB with 1st degree AVB, right superior axis deviation, anterior infarct (age undetermined), ST/T wave abnormality, consider lateral ischemia.       02/25/16 Cardiac cath:  1. No angiographic evidence of CAD 2. Moderate pulmonary HTN 3. Elevated LV filling pressures.  Recommendations: Would consider aggressive volume removal with HD.   02/21/16 Echo: Study Conclusions -  Left ventricle: The cavity size was normal. Wall thickness was increased in a pattern of mild LVH. There was mild concentric hypertrophy. Systolic function was severely reduced. The estimated ejection fraction was in the range of 25% to 30%. Diffuse hypokinesis. Features are consistent with a pseudonormal left ventricular filling pattern, with concomitant abnormal relaxation and increased filling pressure (grade 2 diastolic dysfunction). Doppler parameters are consistent with high ventricular filling pressure. - Aortic valve: Transvalvular velocity was within the normal range. There was no stenosis. There was no regurgitation. - Mitral valve: There was mild regurgitation. - Left atrium: The atrium was severely dilated. - Right ventricle: The cavity size was mildly dilated. Wall thickness was normal. Systolic function was moderately reduced. - Right atrium: The atrium was severely dilated. - Atrial septum: No defect or patent foramen ovale was identified by color flow Doppler. - Tricuspid valve: There was mild regurgitation. - Pulmonic valve: There was moderate regurgitation. - Pulmonary arteries: Systolic pressure was severely increased. PA peak pressure: 55 mm Hg (S). - Inferior vena cava: The vessel was dilated. The respirophasic diameter changes were blunted (< 50%), consistent with elevated central venous pressure. (Comparsion EF 40-45% 07/16/12.)  02/20/16 CXR: IMPRESSION: Cardiac enlargement with vascular congestion. Although the right lung is clear there is patchy left perihilar infiltrate. Possibilities include mild asymmetric pulmonary edema or infectious infiltrate.  02/21/16 CTA Chest/abd/pelvis: IMPRESSION: No evidence of significant pulmonary embolus. Cardiac enlargement with evidence of right heart failure. Bilateral perihilar airspace disease probably representing edema although pneumonia could also have this appearance. Prominent lymph nodes without pathologic enlargement, likely  reactive. Cholelithiasis with  mild gallbladder wall thickening. Cholecystitis not excluded. Diffuse abdominal and pelvic ascites. Atrophic kidneys with heterogeneous delayed nephrogram consistent with renal insufficiency. Prostate enlargement.  Labs were not done at PAT (RN misunderstood and thought since renal patient would not need to DOS.) He will get STAT CMET and CBC on arrival. His last hemodialysis session was on 08/04/16.   Patient with recent cath and echo. Normal coronaries but with severe LV dysfunction. Medical treatment recommended. He has been cleared by cardiology at at least moderate risk. If labs acceptable and otherwise no acute changes then I would anticipate that he could proceed as planned.  George Hugh [facility] Short Stay Center/Anesthesiology Phone [phone] 08/04/2016 4:31 PM

## 2016-08-04 NOTE — Progress Notes (Signed)
Patient is a hemodialysis patient and has dialysis on M,W,F. Patient was dialyzed today. Nurse assumed ISTAT would be performed DOS, however Ebony Hail, [place] informed Nurse that patient needed to have a CBC and CMP done instead. Will enter STAT labs for tomorrow.

## 2016-08-04 NOTE — Progress Notes (Signed)
PCP is at [facility]  Patient denied having any acute cardiac or pulmonary issues, and did not recall being hospitalized in March of 2017, and patient even denied having a cardiac cath which was found in [place] on 02/25/16. After Nurse re questioned patient about the cath, he then stated he had a cath, but everything was fine.  Patient denied having a cardiologist, but did inform Nurse that he recently had a office visit with a Cardiologist before having scheduled surgery. OV in EPIC with Richardson Dopp, PA-C on 07/22/16.  Patient denied any recreational drug usage, and Nurse also explained to patient the importance of smoking cessation. Patient verbalized understanding.   Ebony Hail, [place] called notified of patient history and informed her that patients surgery is scheduled for tomorrow.

## 2016-08-04 NOTE — Anesthesia Preprocedure Evaluation (Addendum)
Anesthesia Evaluation  Patient identified by MRN, date of birth, ID band Patient awake    Reviewed: Allergy & Precautions, H&P , NPO status , Patient's Chart, lab work & pertinent test results, reviewed documented beta blocker date and time   Airway Mallampati: III  TM Distance: >3 FB Neck ROM: Full    Dental no notable dental hx. (+) Poor Dentition, Dental Advisory Given   Pulmonary Current Smoker,    Pulmonary exam normal breath sounds clear to auscultation       Cardiovascular hypertension, Pt. on medications and Pt. on home beta blockers +CHF   Rhythm:Regular Rate:Normal     Neuro/Psych negative neurological ROS  negative psych ROS   GI/Hepatic negative GI ROS, Neg liver ROS,   Endo/Other  negative endocrine ROS  Renal/GU ESRF and DialysisRenal disease  negative genitourinary   Musculoskeletal   Abdominal   Peds  Hematology negative hematology ROS (+) anemia ,   Anesthesia Other Findings   Reproductive/Obstetrics negative OB ROS                            Anesthesia Physical Anesthesia Plan  ASA: III  Anesthesia Plan: General   Post-op Pain Management:    Induction: Intravenous  Airway Management Planned: Oral ETT  Additional Equipment:   Intra-op Plan:   Post-operative Plan: Extubation in OR  Informed Consent: I have reviewed the patients History and Physical, chart, labs and discussed the procedure including the risks, benefits and alternatives for the proposed anesthesia with the patient or authorized representative who has indicated his/her understanding and acceptance.   Dental advisory given  Plan Discussed with: CRNA  Anesthesia Plan Comments:         Anesthesia Quick Evaluation

## 2016-08-05 ENCOUNTER — Ambulatory Visit (HOSPITAL_COMMUNITY): Payer: Medicare Other | Admitting: Vascular Surgery

## 2016-08-05 ENCOUNTER — Ambulatory Visit (HOSPITAL_COMMUNITY): Payer: Medicare Other | Admitting: Anesthesiology

## 2016-08-05 ENCOUNTER — Observation Stay (HOSPITAL_COMMUNITY)
Admission: RE | Admit: 2016-08-05 | Discharge: 2016-08-06 | Disposition: A | Discharge status: Home / Self Care | Payer: Medicare Other | Source: Ambulatory Visit | Attending: Surgery | Admitting: Surgery

## 2016-08-05 ENCOUNTER — Encounter (HOSPITAL_COMMUNITY): Payer: Self-pay | Admitting: General Practice

## 2016-08-05 ENCOUNTER — Encounter (HOSPITAL_COMMUNITY)
Admission: RE | Disposition: A | Discharge status: Home / Self Care | Payer: Self-pay | Source: Ambulatory Visit | Attending: Surgery

## 2016-08-05 DIAGNOSIS — F17210 Nicotine dependence, cigarettes, uncomplicated: Secondary | ICD-10-CM | POA: Diagnosis not present

## 2016-08-05 DIAGNOSIS — N186 End stage renal disease: Secondary | ICD-10-CM | POA: Insufficient documentation

## 2016-08-05 DIAGNOSIS — I5040 Unspecified combined systolic (congestive) and diastolic (congestive) heart failure: Secondary | ICD-10-CM | POA: Insufficient documentation

## 2016-08-05 DIAGNOSIS — Z992 Dependence on renal dialysis: Secondary | ICD-10-CM | POA: Diagnosis not present

## 2016-08-05 DIAGNOSIS — K429 Umbilical hernia without obstruction or gangrene: Secondary | ICD-10-CM | POA: Diagnosis not present

## 2016-08-05 DIAGNOSIS — I132 Hypertensive heart and chronic kidney disease with heart failure and with stage 5 chronic kidney disease, or end stage renal disease: Secondary | ICD-10-CM | POA: Insufficient documentation

## 2016-08-05 DIAGNOSIS — K4090 Unilateral inguinal hernia, without obstruction or gangrene, not specified as recurrent: Principal | ICD-10-CM | POA: Insufficient documentation

## 2016-08-05 HISTORY — PX: INSERTION OF MESH: SHX5868

## 2016-08-05 HISTORY — DX: Unilateral inguinal hernia, without obstruction or gangrene, not specified as recurrent: K40.90

## 2016-08-05 HISTORY — PX: UMBILICAL HERNIA REPAIR: SHX196

## 2016-08-05 HISTORY — PX: INGUINAL HERNIA REPAIR: SHX194

## 2016-08-05 LAB — COMPREHENSIVE METABOLIC PANEL
ALBUMIN: 3.6 g/dL (ref 3.5–5.0)
ALK PHOS: 85 U/L (ref 38–126)
ALT: 12 U/L — AB (ref 17–63)
ANION GAP: 16 — AB (ref 5–15)
AST: 13 U/L — ABNORMAL LOW (ref 15–41)
BILIRUBIN TOTAL: 0.8 mg/dL (ref 0.3–1.2)
BUN: 54 mg/dL — AB (ref 6–20)
CALCIUM: 8.5 mg/dL — AB (ref 8.9–10.3)
CO2: 30 mmol/L (ref 22–32)
CREATININE: 9.25 mg/dL — AB (ref 0.61–1.24)
Chloride: 93 mmol/L — ABNORMAL LOW (ref 101–111)
GFR calc Af Amer: 7 mL/min — ABNORMAL LOW (ref >60)
GFR calc non Af Amer: 6 mL/min — ABNORMAL LOW (ref >60)
GLUCOSE: 105 mg/dL — AB (ref 65–99)
Potassium: 4.7 mmol/L (ref 3.5–5.1)
Sodium: 139 mmol/L (ref 135–145)
TOTAL PROTEIN: 7.5 g/dL (ref 6.5–8.1)

## 2016-08-05 LAB — CBC
HEMATOCRIT: 37.6 % — AB (ref 39.0–52.0)
HEMOGLOBIN: 12.2 g/dL — AB (ref 13.0–17.0)
MCH: 30.7 pg (ref 26.0–34.0)
MCHC: 32.4 g/dL (ref 30.0–36.0)
MCV: 94.7 fL (ref 78.0–100.0)
Platelets: 168 10*3/uL (ref 150–400)
RBC: 3.97 MIL/uL — AB (ref 4.22–5.81)
RDW: 14.7 % (ref 11.5–15.5)
WBC: 4.2 10*3/uL (ref 4.0–10.5)

## 2016-08-05 SURGERY — REPAIR, HERNIA, INGUINAL, ADULT
Anesthesia: General | Site: Abdomen | Laterality: Right

## 2016-08-05 MED ORDER — PHENYLEPHRINE 40 MCG/ML (10ML) SYRINGE FOR IV PUSH (FOR BLOOD PRESSURE SUPPORT)
PREFILLED_SYRINGE | INTRAVENOUS | Status: AC
  Filled 2016-08-05: qty 10

## 2016-08-05 MED ORDER — PROPOFOL 10 MG/ML IV BOLUS
INTRAVENOUS | Status: AC
  Filled 2016-08-05: qty 20

## 2016-08-05 MED ORDER — ROCURONIUM BROMIDE 10 MG/ML (PF) SYRINGE
PREFILLED_SYRINGE | INTRAVENOUS | Status: AC
  Filled 2016-08-05: qty 10

## 2016-08-05 MED ORDER — LIDOCAINE 2% (20 MG/ML) 5 ML SYRINGE
INTRAMUSCULAR | Status: AC
  Filled 2016-08-05: qty 5

## 2016-08-05 MED ORDER — BUPIVACAINE LIPOSOME 1.3 % IJ SUSP
20.0000 mL | INTRAMUSCULAR | Status: DC
  Filled 2016-08-05: qty 20

## 2016-08-05 MED ORDER — NEOSTIGMINE METHYLSULFATE 10 MG/10ML IV SOLN
INTRAVENOUS | Status: DC | PRN
  Administered 2016-08-05: 4 mg via INTRAVENOUS

## 2016-08-05 MED ORDER — ONDANSETRON HCL 4 MG/2ML IJ SOLN
INTRAMUSCULAR | Status: DC | PRN
  Administered 2016-08-05: 4 mg via INTRAVENOUS

## 2016-08-05 MED ORDER — HYDRALAZINE HCL 20 MG/ML IJ SOLN: 10.0000 mg | INTRAMUSCULAR | Status: DC | PRN

## 2016-08-05 MED ORDER — CHLORHEXIDINE GLUCONATE CLOTH 2 % EX PADS
6.0000 | MEDICATED_PAD | Freq: Once | CUTANEOUS | Status: DC
Start: 2016-08-05 — End: 2016-08-05

## 2016-08-05 MED ORDER — HYDROMORPHONE HCL 1 MG/ML IJ SOLN
INTRAMUSCULAR | Status: AC
  Filled 2016-08-05: qty 1

## 2016-08-05 MED ORDER — DIPHENHYDRAMINE HCL 25 MG PO CAPS
25.0000 mg | ORAL_CAPSULE | Freq: Four times a day (QID) | ORAL | Status: DC | PRN
Start: 2016-08-05 — End: 2016-08-06

## 2016-08-05 MED ORDER — ONDANSETRON 4 MG PO TBDP: 4.0000 mg | ORAL_TABLET | Freq: Four times a day (QID) | ORAL | Status: DC | PRN

## 2016-08-05 MED ORDER — CLONAZEPAM 0.5 MG PO TABS
0.5000 mg | ORAL_TABLET | Freq: Every day | ORAL | Status: DC | PRN
  Administered 2016-08-06: 0.5 mg via ORAL

## 2016-08-05 MED ORDER — DIPHENHYDRAMINE HCL 50 MG/ML IJ SOLN: 25.0000 mg | Freq: Four times a day (QID) | INTRAMUSCULAR | Status: DC | PRN

## 2016-08-05 MED ORDER — FENTANYL CITRATE (PF) 100 MCG/2ML IJ SOLN
INTRAMUSCULAR | Status: AC
  Filled 2016-08-05: qty 2

## 2016-08-05 MED ORDER — OXYCODONE HCL 5 MG PO TABS
5.0000 mg | ORAL_TABLET | ORAL | Status: DC | PRN
  Administered 2016-08-05: 10 mg via ORAL
  Administered 2016-08-05 (×2): 5 mg via ORAL
  Administered 2016-08-06: 10 mg via ORAL
  Filled 2016-08-05 (×3): qty 2

## 2016-08-05 MED ORDER — GLYCOPYRROLATE 0.2 MG/ML IV SOSY
PREFILLED_SYRINGE | INTRAVENOUS | Status: AC
  Filled 2016-08-05: qty 3

## 2016-08-05 MED ORDER — CALCIUM ACETATE (PHOS BINDER) 667 MG PO CAPS
2001.0000 mg | ORAL_CAPSULE | Freq: Three times a day (TID) | ORAL | Status: DC
  Administered 2016-08-05 – 2016-08-06 (×2): 2001 mg via ORAL
  Filled 2016-08-05 (×2): qty 3

## 2016-08-05 MED ORDER — ONDANSETRON HCL 4 MG/2ML IJ SOLN: 4.0000 mg | Freq: Four times a day (QID) | INTRAMUSCULAR | Status: DC | PRN

## 2016-08-05 MED ORDER — METHOCARBAMOL 500 MG PO TABS
500.0000 mg | ORAL_TABLET | Freq: Four times a day (QID) | ORAL | Status: DC | PRN
  Administered 2016-08-05: 500 mg via ORAL
  Filled 2016-08-05: qty 1

## 2016-08-05 MED ORDER — OXYCODONE HCL 5 MG PO TABS
ORAL_TABLET | ORAL | Status: AC
  Filled 2016-08-05: qty 1

## 2016-08-05 MED ORDER — ETOMIDATE 2 MG/ML IV SOLN
INTRAVENOUS | Status: AC
  Filled 2016-08-05: qty 10

## 2016-08-05 MED ORDER — MIDAZOLAM HCL 2 MG/2ML IJ SOLN
INTRAMUSCULAR | Status: AC
  Filled 2016-08-05: qty 2

## 2016-08-05 MED ORDER — BUPIVACAINE-EPINEPHRINE (PF) 0.25% -1:200000 IJ SOLN
INTRAMUSCULAR | Status: AC
  Filled 2016-08-05: qty 30

## 2016-08-05 MED ORDER — MIDAZOLAM HCL 5 MG/5ML IJ SOLN
INTRAMUSCULAR | Status: DC | PRN
  Administered 2016-08-05: 2 mg via INTRAVENOUS

## 2016-08-05 MED ORDER — CHLORHEXIDINE GLUCONATE CLOTH 2 % EX PADS: 6.0000 | MEDICATED_PAD | Freq: Once | CUTANEOUS | Status: DC

## 2016-08-05 MED ORDER — LIDOCAINE HCL (CARDIAC) 20 MG/ML IV SOLN
INTRAVENOUS | Status: DC | PRN
  Administered 2016-08-05: 60 mg via INTRAVENOUS

## 2016-08-05 MED ORDER — ROCURONIUM BROMIDE 100 MG/10ML IV SOLN
INTRAVENOUS | Status: DC | PRN
Start: 2016-08-05 — End: 2016-08-05
  Administered 2016-08-05: 50 mg via INTRAVENOUS
  Administered 2016-08-05: 10 mg via INTRAVENOUS

## 2016-08-05 MED ORDER — PHENYLEPHRINE HCL 10 MG/ML IJ SOLN
INTRAMUSCULAR | Status: DC | PRN
  Administered 2016-08-05 (×2): 80 ug via INTRAVENOUS
  Administered 2016-08-05 (×3): 40 ug via INTRAVENOUS
  Administered 2016-08-05: 80 ug via INTRAVENOUS

## 2016-08-05 MED ORDER — MORPHINE SULFATE (PF) 2 MG/ML IV SOLN
2.0000 mg | INTRAVENOUS | Status: DC | PRN
  Administered 2016-08-05 – 2016-08-06 (×2): 2 mg via INTRAVENOUS
  Filled 2016-08-05 (×2): qty 1

## 2016-08-05 MED ORDER — CEFAZOLIN SODIUM-DEXTROSE 2-4 GM/100ML-% IV SOLN
2.0000 g | INTRAVENOUS | Status: AC
  Administered 2016-08-05: 2 g via INTRAVENOUS
  Filled 2016-08-05: qty 100

## 2016-08-05 MED ORDER — CARVEDILOL 25 MG PO TABS
25.0000 mg | ORAL_TABLET | Freq: Two times a day (BID) | ORAL | Status: DC
  Administered 2016-08-05 – 2016-08-06 (×2): 25 mg via ORAL
  Filled 2016-08-05 (×2): qty 1

## 2016-08-05 MED ORDER — ISOSORBIDE MONONITRATE ER 60 MG PO TB24
60.0000 mg | ORAL_TABLET | Freq: Every day | ORAL | Status: DC
  Administered 2016-08-06: 60 mg via ORAL
  Filled 2016-08-05: qty 1

## 2016-08-05 MED ORDER — CEFAZOLIN SODIUM-DEXTROSE 2-4 GM/100ML-% IV SOLN
2.0000 g | Freq: Three times a day (TID) | INTRAVENOUS | Status: AC
  Administered 2016-08-05: 2 g via INTRAVENOUS
  Filled 2016-08-05: qty 100

## 2016-08-05 MED ORDER — GLYCOPYRROLATE 0.2 MG/ML IJ SOLN
INTRAMUSCULAR | Status: DC | PRN
  Administered 2016-08-05: .8 mg via INTRAVENOUS
  Administered 2016-08-05: 0.2 mg via INTRAVENOUS

## 2016-08-05 MED ORDER — SODIUM CHLORIDE 0.9 % IV SOLN
INTRAVENOUS | Status: DC | PRN
  Administered 2016-08-05: 07:00:00 via INTRAVENOUS

## 2016-08-05 MED ORDER — FENTANYL CITRATE (PF) 100 MCG/2ML IJ SOLN
INTRAMUSCULAR | Status: DC | PRN
Start: 2016-08-05 — End: 2016-08-05
  Administered 2016-08-05 (×2): 50 ug via INTRAVENOUS
  Administered 2016-08-05: 100 ug via INTRAVENOUS

## 2016-08-05 MED ORDER — DEXAMETHASONE SODIUM PHOSPHATE 10 MG/ML IJ SOLN
INTRAMUSCULAR | Status: DC | PRN
  Administered 2016-08-05: 5 mg via INTRAVENOUS

## 2016-08-05 MED ORDER — ACETAMINOPHEN 500 MG PO TABS
1000.0000 mg | ORAL_TABLET | Freq: Four times a day (QID) | ORAL | Status: DC
  Administered 2016-08-05 – 2016-08-06 (×4): 1000 mg via ORAL
  Filled 2016-08-05 (×4): qty 2

## 2016-08-05 MED ORDER — HYDROMORPHONE HCL 1 MG/ML IJ SOLN
0.2500 mg | INTRAMUSCULAR | Status: DC | PRN
  Administered 2016-08-05: 0.5 mg via INTRAVENOUS
  Administered 2016-08-05: 0.25 mg via INTRAVENOUS

## 2016-08-05 MED ORDER — ONDANSETRON HCL 4 MG/2ML IJ SOLN
INTRAMUSCULAR | Status: AC
  Filled 2016-08-05: qty 2

## 2016-08-05 MED ORDER — DEXAMETHASONE SODIUM PHOSPHATE 10 MG/ML IJ SOLN
INTRAMUSCULAR | Status: AC
  Filled 2016-08-05: qty 1

## 2016-08-05 MED ORDER — CALCIUM ACETATE 667 MG PO CAPS: 2001.0000 mg | ORAL_CAPSULE | Freq: Three times a day (TID) | ORAL | Status: DC

## 2016-08-05 MED ORDER — NEOSTIGMINE METHYLSULFATE 5 MG/5ML IV SOSY
PREFILLED_SYRINGE | INTRAVENOUS | Status: AC
  Filled 2016-08-05: qty 5

## 2016-08-05 MED ORDER — ETOMIDATE 2 MG/ML IV SOLN
INTRAVENOUS | Status: DC | PRN
  Administered 2016-08-05: 20 mg via INTRAVENOUS

## 2016-08-05 MED ORDER — 0.9 % SODIUM CHLORIDE (POUR BTL) OPTIME
TOPICAL | Status: DC | PRN
Start: 2016-08-05 — End: 2016-08-05
  Administered 2016-08-05: 1000 mL

## 2016-08-05 MED ORDER — BUPIVACAINE-EPINEPHRINE 0.25% -1:200000 IJ SOLN
INTRAMUSCULAR | Status: DC | PRN
  Administered 2016-08-05: 20 mL

## 2016-08-05 SURGICAL SUPPLY — 65 items
BENZOIN TINCTURE PRP APPL 2/3 (GAUZE/BANDAGES/DRESSINGS) ×8 IMPLANT
BLADE SURG 15 STRL LF DISP TIS (BLADE) ×2 IMPLANT
BLADE SURG 15 STRL SS (BLADE) ×2
BLADE SURG ROTATE 9660 (MISCELLANEOUS) ×4 IMPLANT
CANISTER SUCTION 2500CC (MISCELLANEOUS) IMPLANT
CHLORAPREP W/TINT 26ML (MISCELLANEOUS) ×4 IMPLANT
CLOSURE STERI-STRIP 1/2X4 (GAUZE/BANDAGES/DRESSINGS) ×2
CLOSURE WOUND 1/2 X4 (GAUZE/BANDAGES/DRESSINGS) ×1
CLSR STERI-STRIP ANTIMIC 1/2X4 (GAUZE/BANDAGES/DRESSINGS) ×6 IMPLANT
COVER SURGICAL LIGHT HANDLE (MISCELLANEOUS) ×4 IMPLANT
DRAIN PENROSE 1/2X12 LTX STRL (WOUND CARE) ×4 IMPLANT
DRAPE LAPAROSCOPIC ABDOMINAL (DRAPES) ×4 IMPLANT
DRAPE LAPAROTOMY T 98X78 PEDS (DRAPES) IMPLANT
DRAPE LAPAROTOMY TRNSV 102X78 (DRAPE) IMPLANT
DRAPE UTILITY XL STRL (DRAPES) ×4 IMPLANT
DRSG TEGADERM 4X4.75 (GAUZE/BANDAGES/DRESSINGS) ×8 IMPLANT
ELECT CAUTERY BLADE 6.4 (BLADE) ×4 IMPLANT
ELECT REM PT RETURN 9FT ADLT (ELECTROSURGICAL) ×4
ELECTRODE REM PT RTRN 9FT ADLT (ELECTROSURGICAL) ×2 IMPLANT
GAUZE SPONGE 2X2 8PLY STRL LF (GAUZE/BANDAGES/DRESSINGS) ×2 IMPLANT
GAUZE SPONGE 4X4 12PLY STRL (GAUZE/BANDAGES/DRESSINGS) ×4 IMPLANT
GAUZE SPONGE 4X4 16PLY XRAY LF (GAUZE/BANDAGES/DRESSINGS) ×4 IMPLANT
GLOVE BIO SURGEON STRL SZ 6 (GLOVE) ×4 IMPLANT
GLOVE BIO SURGEON STRL SZ7 (GLOVE) ×12 IMPLANT
GLOVE BIOGEL PI IND STRL 6.5 (GLOVE) ×4 IMPLANT
GLOVE BIOGEL PI IND STRL 7.0 (GLOVE) ×2 IMPLANT
GLOVE BIOGEL PI IND STRL 7.5 (GLOVE) ×2 IMPLANT
GLOVE BIOGEL PI INDICATOR 6.5 (GLOVE) ×4
GLOVE BIOGEL PI INDICATOR 7.0 (GLOVE) ×2
GLOVE BIOGEL PI INDICATOR 7.5 (GLOVE) ×2
GOWN STRL REUS W/ TWL LRG LVL3 (GOWN DISPOSABLE) ×8 IMPLANT
GOWN STRL REUS W/TWL LRG LVL3 (GOWN DISPOSABLE) ×8
KIT BASIN OR (CUSTOM PROCEDURE TRAY) ×4 IMPLANT
KIT ROOM TURNOVER OR (KITS) ×4 IMPLANT
MESH PARIETEX PROGRIP RIGHT (Mesh General) ×4 IMPLANT
MESH VENTRALEX ST 1-7/10 CRC S (Mesh General) ×4 IMPLANT
NEEDLE HYPO 25GX1X1/2 BEV (NEEDLE) ×4 IMPLANT
NS IRRIG 1000ML POUR BTL (IV SOLUTION) ×4 IMPLANT
PACK SURGICAL SETUP 50X90 (CUSTOM PROCEDURE TRAY) ×4 IMPLANT
PAD ARMBOARD 7.5X6 YLW CONV (MISCELLANEOUS) ×4 IMPLANT
PENCIL BUTTON HOLSTER BLD 10FT (ELECTRODE) ×4 IMPLANT
SPONGE GAUZE 2X2 STER 10/PKG (GAUZE/BANDAGES/DRESSINGS) ×2
SPONGE GAUZE 4X4 12PLY STER LF (GAUZE/BANDAGES/DRESSINGS) ×8 IMPLANT
SPONGE INTESTINAL PEANUT (DISPOSABLE) ×4 IMPLANT
STRIP CLOSURE SKIN 1/2X4 (GAUZE/BANDAGES/DRESSINGS) ×3 IMPLANT
SUT MNCRL AB 4-0 PS2 18 (SUTURE) ×8 IMPLANT
SUT NOVA NAB DX-16 0-1 5-0 T12 (SUTURE) ×4 IMPLANT
SUT NOVA NAB GS-21 0 18 T12 DT (SUTURE) ×4 IMPLANT
SUT PDS AB 0 CT 36 (SUTURE) IMPLANT
SUT SILK 2 0 SH (SUTURE) IMPLANT
SUT SILK 3 0 (SUTURE)
SUT SILK 3-0 18XBRD TIE 12 (SUTURE) IMPLANT
SUT VIC AB 0 CT2 27 (SUTURE) ×4 IMPLANT
SUT VIC AB 2-0 SH 27 (SUTURE) ×2
SUT VIC AB 2-0 SH 27X BRD (SUTURE) ×2 IMPLANT
SUT VIC AB 3-0 SH 27 (SUTURE) ×4
SUT VIC AB 3-0 SH 27X BRD (SUTURE) ×2 IMPLANT
SUT VIC AB 3-0 SH 27XBRD (SUTURE) ×2 IMPLANT
SYR BULB 3OZ (MISCELLANEOUS) ×4 IMPLANT
SYR CONTROL 10ML LL (SYRINGE) ×4 IMPLANT
TOWEL OR 17X24 6PK STRL BLUE (TOWEL DISPOSABLE) ×4 IMPLANT
TOWEL OR 17X26 10 PK STRL BLUE (TOWEL DISPOSABLE) ×4 IMPLANT
TUBE CONNECTING 12'X1/4 (SUCTIONS)
TUBE CONNECTING 12X1/4 (SUCTIONS) IMPLANT
YANKAUER SUCT BULB TIP NO VENT (SUCTIONS) IMPLANT

## 2016-08-05 NOTE — Transfer of Care (Signed)
Immediate Anesthesia Transfer of Care Note  Patient: [name] [name]  Procedure(s) Performed: Procedure(s): RIGHT INGUINAL HERNIA REPAIR WITH MESH (Right) UMBILICAL HERINA REPAIR WITH MESH (N/A) INSERTION OF MESH (Right)  Patient Location: PACU  Anesthesia Type:General  Level of Consciousness:  sedated, patient cooperative and responds to stimulation  Airway & Oxygen Therapy:Patient Spontanous Breathing and Patient connected to face mask oxgen  Post-op Assessment:  Report given to PACU RN and Post -op Vital signs reviewed and stable  Post vital signs:  Reviewed and stable  Last Vitals:  Vitals:   08/05/16 0611 08/05/16 0918  BP: 119/86   Pulse: (!) 58 70  Resp: 20 (!) 7  Temp: 36.8 C 123XX123 C    Complications: No apparent anesthesia complications

## 2016-08-05 NOTE — Op Note (Signed)
Right inguinal hernia repair with mesh/ umbilical hernia repair with meshProcedure Note  Indications: The patient presented with a history of a right, reducible inguinal hernia and an umbilical hernia  Pre-operative Diagnosis: right reducible inguinal hernia and umbilical hernia Post-operative Diagnosis: same  Surgeon: Maia Petties.   Assistants: Dr. Romana Juniper  Anesthesia: General endotracheal anesthesia  ASA Class: 2  Procedure Details  The patient was seen again in the Holding Room. The risks, benefits, complications, treatment options, and expected outcomes were discussed with the patient. The possibilities of reaction to medication, pulmonary aspiration, perforation of viscus, bleeding, recurrent infection, the need for additional procedures, and development of a complication requiring transfusion or further operation were discussed with the patient and/or family. The likelihood of success in repairing the hernia and returning the patient to their previous functional status is good.  There was concurrence with the proposed plan, and informed consent was obtained. The site of surgery was properly noted/marked. The patient was taken to the Operating Room, identified as [name] [name], and the procedure verified as right inguinal hernia repair and umbilical hernia repair. A Time Out was held and the above information confirmed.  The patient was placed in the supine position and underwent induction of anesthesia. The lower abdomen and groin was prepped with Chloraprep and draped in the standard fashion, and 0.25% Marcaine with epinephrine was used to anesthetize the skin over the mid-portion of the inguinal canal. An oblique incision was made. Dissection was carried down through the subcutaneous tissue with cautery to the external oblique fascia.  We opened the external oblique fascia along the direction of its fibers to the external ring.  The spermatic cord was circumferentially dissected  bluntly and retracted with a Penrose drain.  The ilioinguinal nerve was identified and preserved.  The floor of the inguinal canal was inspected and showed a large direct hernia sac.  We dissected the direct sac away from the surrounding tissues and reduced it.  We closed the floor of the inguinal canal with 0 Vicryl.  We skeletonized the spermatic cord and removed a small cord lipoma.  We used a right Progrip mesh which was inserted and deployed across the floor of the inguinal canal. The mesh was tucked underneath the external oblique fascia laterally.  The flap of the mesh was closed around the spermatic cord to recreate the internal inguinal ring.  The mesh was secured to the pubic tubercle with 0 Vicryl.  The lower edge of the mesh was secured to the shelving edgeThe external oblique fascia was reapproximated with 2-0 Vicryl.  3-0 Vicryl was used to close the subcutaneous tissues and 4-0 Monocryl was used to close the skin in subcuticular fashion.  We then turned our attention to the umbilicus.  We made a transverse incision below the umbilicus.  Dissection was carried down to the hernia sac with cautery.  We dissected bluntly around the hernia sac down to the edge of the fascial defect.  We reduced the hernia sac back into the pre-peritoneal space.  The fascial defect measured 1.5 cm.  We cleared the fascia in all directions.  A 4.3 cm Ventralex mesh was inserted into the pre-peritoneal space and was deployed.  The mesh was secured with four trans-fascial sutures of 0 Novofil.  The fascial defect was closed with multiple interrupted figure-of-eight 1 Novofil sutures.  The base of the umbilicus was tacked down with 3-0 Vicryl.  3-0 Vicryl was used to close the subcutaneous tissues and 4-0 Monocryl was used  to close the skin.  Steri-strips and clean dressing were applied.  The patient was extubated and brought to the recovery room in stable condition.  All sponge, instrument, and needle counts were correct  prior to closure and at the conclusion of the case.  Benzoin and steri-strips were used to seal the incision.  A clean dressing was applied.  The patient was then extubated and brought to the recovery room in stable condition.  All sponge, instrument, and needle counts were correct prior to closure and at the conclusion of the case.   Estimated Blood Loss: Minimal                 Complications: None; patient tolerated the procedure well.         Disposition: PACU - hemodynamically stable.         Condition: stable  Imogene Burn. Georgette Dover, MD, [facility]  General/ Trauma Surgery  08/05/2016 9:14 AM

## 2016-08-05 NOTE — Anesthesia Postprocedure Evaluation (Signed)
Anesthesia Post Note  Patient: [name]  Procedure(s) Performed: Procedure(s) (LRB): RIGHT INGUINAL HERNIA REPAIR WITH MESH (Right) UMBILICAL HERINA REPAIR WITH MESH (N/A) INSERTION OF MESH (Right)  Patient location during evaluation: PACU Anesthesia Type: General Level of consciousness: awake and alert Pain management: pain level controlled Vital Signs Assessment: post-procedure vital signs reviewed and stable Respiratory status: spontaneous breathing, nonlabored ventilation, respiratory function stable and patient connected to nasal cannula oxygen Cardiovascular status: blood pressure returned to baseline and stable Postop Assessment: no signs of nausea or vomiting Anesthetic complications: no    Last Vitals:  Vitals:   08/05/16 1130 08/05/16 1200  BP: 110/79 115/86  Pulse: (!) 55 (!) 55  Resp: 13 10  Temp:      Last Pain:  Vitals:   08/05/16 1200  TempSrc:   PainSc: Asleep                 Gaelle Adriance,W. EDMOND

## 2016-08-05 NOTE — H&P (Signed)
History of Present Illness  Patient words: hernia.  The patient is a 50 year old male who presents with an inguinal hernia. Cardiology - [name] [name] Renal - [name] [name] PCP - Dr. Jeanie [name]  This is a 50 year old male who is dialysis dependent who presents with several months of some generalized abdominal distention. His develop some protrusion in the upper part of his abdomen whenever he tries to sit up. He reports some discomfort near his umbilicus. He has also developed a bulge in his right groin over the last 3 months that seems to be enlarging and is uncomfortable. He has no left-sided symptoms. He had a CT scan in March for other reasons and there was no sign of ventral hernia. He had a noticeable right inguinal hernia and a tiny fat-containing left inguinal hernia on that scan.  CLINICAL DATA: Sharp right mid back and frontal abdominal pain. Two days productive cough. Hemoptysis.  EXAM: CT ANGIOGRAPHY CHEST  CT ABDOMEN AND PELVIS WITH CONTRAST  TECHNIQUE: Multidetector CT imaging of the chest was performed using the standard protocol during bolus administration of intravenous contrast. Multiplanar CT image reconstructions and MIPs were obtained to evaluate the vascular anatomy. Multidetector CT imaging of the abdomen and pelvis was performed using the standard protocol during bolus administration of intravenous contrast.  CONTRAST: 142mL OMNIPAQUE IOHEXOL 350 MG/ML SOLN  COMPARISON: CT abdomen and pelvis 03/08/2014  FINDINGS: CTA CHEST FINDINGS  Technically adequate study with good opacification of the central and segmental pulmonary arteries. No focal filling defects are demonstrated. No evidence of significant pulmonary embolus.  Diffuse cardiac enlargement. Reflux of contrast material into the IVC and hepatic veins consistent with passive congestion and right heart failure. No significant lymphadenopathy in the chest. Visualized mediastinal and axillary  lymph nodes are prominent but without pathologic enlargement. These are likely reactive. Esophagus is decompressed.  Evaluation of lungs is limited due to motion artifact but there appears to be diffuse perihilar airspace disease likely representing edema although pneumonia could also have this appearance. No pleural effusions. No pneumothorax. Airways appear patent. The nodule demonstrated on previous chest x-ray from 10/09/2015 appears to correspond to callus from a left posterior rib fracture. No destructive bone lesions.  CT ABDOMEN and PELVIS FINDINGS  Cholelithiasis with mild gallbladder wall thickening. No bile duct dilatation. Liver, spleen, pancreas, adrenal glands, abdominal aorta, inferior vena cava, and retroperitoneal lymph nodes are unremarkable. There is mild diffuse abdominal and pelvic ascites. Kidneys are atrophic with heterogeneous delayed nephrograms consistent with renal insufficiency. The left renal mass seen previously is no longer present and may have been removed or resolved in the interval. No hydronephrosis in either kidney. Multiple sub cm renal cysts.  Stomach, small bowel, and colon are not abnormally distended and no discrete wall thickening is appreciated. Contrast material flows through to the colon without evidence of bowel obstruction. No free air in the abdomen.  Pelvis: Prostate gland is enlarged, measuring 4.2 cm diameter. Mild thickening of bladder wall may be due to hypertrophy or cystitis. Free fluid in the pelvis consistent with ascites. Fluid extends into a right inguinal hernia. No bowel herniation. The appendix is normal. No pelvic mass or lymphadenopathy. Small left inguinal hernia containing mostly fat. No destructive bone lesions.  Review of the MIP images confirms the above findings.  IMPRESSION: No evidence of significant pulmonary embolus. Cardiac enlargement with evidence of right heart failure. Bilateral perihilar  airspace disease probably representing edema although pneumonia could also have this appearance. Prominent lymph nodes without pathologic enlargement,  likely reactive.  Cholelithiasis with mild gallbladder wall thickening. Cholecystitis not excluded. Diffuse abdominal and pelvic ascites. Atrophic kidneys with heterogeneous delayed nephrogram consistent with renal insufficiency. Prostate enlargement.   Electronically Signed By: [name] [name] M.D. On: 02/21/2016 02:05   Other Problems  Anxiety Disorder Back Pain Chronic Renal Failure Syndrome High blood pressure Inguinal Hernia Sleep Apnea Umbilical Hernia Repair  Past Surgical History  Dialysis Shunt / Fistula  Diagnostic Studies History  Colonoscopy never  Allergies  No Known Drug Allergies 06/11/2016  Medication History  Carvedilol (25MG  Tablet, Oral) Active. ROPINIRole HCl (0.5MG  Tablet, Oral) Active. Isosorbide Mononitrate ER (60MG  Tablet ER 24HR, Oral) Active. Calcium Acetate (Phos Binder) (667MG  Capsule, Oral) Active. ClonazePAM (0.5MG  Tablet, Oral) Active. Ferric Ammon Cit-B12-FA (15-0.010-0.8MG /ML Liquid, Oral) Active. Medications Reconciled  Social History Alcohol use Occasional alcohol use. Illicit drug use Recently quit drug use. No caffeine use Tobacco use Former smoker.  Family History  Arthritis Father. Diabetes Mellitus Mother. Hypertension Brother, Father, Mother, Sister. Prostate Cancer Father.     Review of Systems  General Present- Fatigue. Not Present- Appetite Loss, Chills, Fever, Night Sweats, Weight Gain and Weight Loss. Skin Present- Non-Healing Wounds. Not Present- Change in Wart/Mole, Dryness, Hives, Jaundice, New Lesions, Rash and Ulcer. HEENT Not Present- Earache, Hearing Loss, Hoarseness, Nose Bleed, Oral Ulcers, Ringing in the Ears, Seasonal Allergies, Sinus Pain, Sore Throat, Visual Disturbances, Wears glasses/contact lenses and  Yellow Eyes. Respiratory Present- Chronic Cough and Wheezing. Not Present- Bloody sputum, Difficulty Breathing and Snoring. Breast Not Present- Breast Mass, Breast Pain, Nipple Discharge and Skin Changes. Cardiovascular Present- Difficulty Breathing Lying Down, Shortness of Breath and Swelling of Extremities. Not Present- Chest Pain, Leg Cramps, Palpitations and Rapid Heart Rate. Gastrointestinal Present- Abdominal Pain and Excessive gas. Not Present- Bloating, Bloody Stool, Change in Bowel Habits, Chronic diarrhea, Constipation, Difficulty Swallowing, Gets full quickly at meals, Hemorrhoids, Indigestion, Nausea, Rectal Pain and Vomiting. Male Genitourinary Present- Impotence. Not Present- Blood in Urine, Change in Urinary Stream, Frequency, Nocturia, Painful Urination, Urgency and Urine Leakage. Musculoskeletal Present- Back Pain. Not Present- Joint Pain, Joint Stiffness, Muscle Pain, Muscle Weakness and Swelling of Extremities. Neurological Present- Weakness. Not Present- Decreased Memory, Fainting, Headaches, Numbness, Seizures, Tingling, Tremor and Trouble walking. Psychiatric Present- Anxiety. Not Present- Bipolar, Change in Sleep Pattern, Depression, Fearful and Frequent crying. Endocrine Not Present- Cold Intolerance, Excessive Hunger, Hair Changes, Heat Intolerance, Hot flashes and New Diabetes. Hematology Not Present- Blood Thinners, Easy Bruising, Excessive bleeding, Gland problems, HIV and Persistent Infections.  Vitals  Weight: 170.8 lb Height: 69.5in Body Surface Area: 1.94 m Body Mass Index: 24.86 kg/m  Temp.: 98.74F(Oral)  BP: 126/70 (Sitting, Left Arm, Standard)      Physical Exam   The physical exam findings are as follows: Note:WDWN in NAD HEENT: EOMI, sclera anicteric Neck: No masses, no thyromegaly Lungs: CTA bilaterally; normal respiratory effort CV: Regular rate and rhythm; no murmurs Abd: +bowel sounds, soft, upper midline rectus diastasis  when sitting up Small umbilical hernia - reducible; 1.5 cm defect Large right inguinal hernia - reducible; no left inguinal hernia on physical examination Ext: Well-perfused; no edema Skin: Warm, dry; no sign of jaundice    Assessment & Plan   RECTUS DIASTASIS (123456)  UMBILICAL HERNIA WITHOUT OBSTRUCTION OR GANGRENE (K42.9)  RIGHT INGUINAL HERNIA (K40.90)  Current Plans Schedule for Surgery - Right inguinal hernia repair with mesh/ umbilical hernia repair with mesh. The surgical procedure has been discussed with the patient. Potential risks, benefits, alternative treatments, and expected outcomes have  been explained. All of the patient's questions at this time have been answered. The likelihood of reaching the patient's treatment goal is good. The patient understand the proposed surgical procedure and wishes to proceed.   Cardiac clearance - [name] [name]  Will need admission after surgery for dialysis the next day.  Will schedule for Tuesday or Thursday, since patient dialyzes MWF  Imogene Burn. Georgette Dover, MD, [facility]  General/ Trauma Surgery  08/05/2016 7:07 AM

## 2016-08-05 NOTE — Progress Notes (Signed)
Pt. With ESRD secondary to HTN on MWF HD at [facility] is admitted to OBS following right inguinal and umbilical hernia repairs by Dr. Georgette Dover.  Plan HD in the am and hold heparin.  HD: 4 hr 400/A 1.5 EDW 75.5 (post wt 77.2 Monday) 2 K 2 Ca heparin 5000 calcitriol 0.25 - no ESA or Fe. Access left lower AVF Last outpt Hgb 12.5  Amalia Hailey, PA-C

## 2016-08-05 NOTE — Progress Notes (Signed)
08/05/2016 1:51 PM  New admission from PACU. Placed patient on telemetry box [phone]. Informed CCMD. Awaiting second verification.   At time of transfer patient is alert and oriented X4. Call light placed within in reach, educated on use. Verbalized understanding. IVF running. Bed alarm activated. Oxygen in place. Will inform attending RN that patient has arrived.   [employer], RN-BC, [employer] [facility] Phone [phone]

## 2016-08-05 NOTE — Care Management Obs Status (Signed)
MEDICARE OBSERVATION STATUS NOTIFICATION   Patient Details  Name: [name] [name] MRN: [id number] Date of Birth: 1966-04-24   Medicare Observation Status Notification Given:  Yes CM spoke to patient at the bedside and delivered and explained [place] letter. Patient verbalized understanding and said that there were no questions. Patient signed [facility] letter and was given a copy.    Guido Sander, RN 08/05/2016, 10:21 PM

## 2016-08-05 NOTE — Anesthesia Procedure Notes (Signed)
Procedure Name: Intubation Date/Time: 08/05/2016 7:42 AM Performed by: Freddie Breech Pre-anesthesia Checklist: Patient identified, Emergency Drugs available, Suction available and Patient being monitored Patient Re-evaluated:Patient Re-evaluated prior to inductionOxygen Delivery Method: Circle System Utilized Preoxygenation: Pre-oxygenation with 100% oxygen Intubation Type: IV induction Ventilation: Mask ventilation without difficulty Laryngoscope Size: Mac and 4 Grade View: Grade I Tube type: Oral Tube size: 7.0 mm Number of attempts: 1 Airway Equipment and Method: Stylet and Oral airway Placement Confirmation: ETT inserted through vocal cords under direct vision,  positive ETCO2 and breath sounds checked- equal and bilateral Secured at: 22 cm Tube secured with: Tape Dental Injury: Teeth and Oropharynx as per pre-operative assessment

## 2016-08-06 ENCOUNTER — Encounter (HOSPITAL_COMMUNITY): Payer: Self-pay | Admitting: Surgery

## 2016-08-06 DIAGNOSIS — F17210 Nicotine dependence, cigarettes, uncomplicated: Secondary | ICD-10-CM | POA: Diagnosis not present

## 2016-08-06 DIAGNOSIS — I5040 Unspecified combined systolic (congestive) and diastolic (congestive) heart failure: Secondary | ICD-10-CM | POA: Diagnosis not present

## 2016-08-06 DIAGNOSIS — I132 Hypertensive heart and chronic kidney disease with heart failure and with stage 5 chronic kidney disease, or end stage renal disease: Secondary | ICD-10-CM | POA: Diagnosis not present

## 2016-08-06 DIAGNOSIS — N186 End stage renal disease: Secondary | ICD-10-CM | POA: Diagnosis not present

## 2016-08-06 DIAGNOSIS — K4090 Unilateral inguinal hernia, without obstruction or gangrene, not specified as recurrent: Secondary | ICD-10-CM | POA: Diagnosis not present

## 2016-08-06 DIAGNOSIS — Z992 Dependence on renal dialysis: Secondary | ICD-10-CM | POA: Diagnosis not present

## 2016-08-06 LAB — BASIC METABOLIC PANEL
Anion gap: 17 — ABNORMAL HIGH (ref 5–15)
BUN: 71 mg/dL — AB (ref 6–20)
CHLORIDE: 94 mmol/L — AB (ref 101–111)
CO2: 22 mmol/L (ref 22–32)
Calcium: 8.2 mg/dL — ABNORMAL LOW (ref 8.9–10.3)
Creatinine, Ser: 10.73 mg/dL — ABNORMAL HIGH (ref 0.61–1.24)
GFR calc Af Amer: 6 mL/min — ABNORMAL LOW (ref >60)
GFR calc non Af Amer: 5 mL/min — ABNORMAL LOW (ref >60)
Glucose, Bld: 128 mg/dL — ABNORMAL HIGH (ref 65–99)
POTASSIUM: 6 mmol/L — AB (ref 3.5–5.1)
SODIUM: 133 mmol/L — AB (ref 135–145)

## 2016-08-06 LAB — CBC
HEMATOCRIT: 36.8 % — AB (ref 39.0–52.0)
HEMOGLOBIN: 12 g/dL — AB (ref 13.0–17.0)
MCH: 30.9 pg (ref 26.0–34.0)
MCHC: 32.6 g/dL (ref 30.0–36.0)
MCV: 94.8 fL (ref 78.0–100.0)
Platelets: 174 10*3/uL (ref 150–400)
RBC: 3.88 MIL/uL — ABNORMAL LOW (ref 4.22–5.81)
RDW: 15 % (ref 11.5–15.5)
WBC: 8.9 10*3/uL (ref 4.0–10.5)

## 2016-08-06 MED ORDER — CLONAZEPAM 0.5 MG PO TABS
ORAL_TABLET | ORAL | Status: AC
  Filled 2016-08-06: qty 1

## 2016-08-06 MED ORDER — OXYCODONE HCL 5 MG PO TABS: 5.0000 mg | ORAL_TABLET | ORAL | 0 refills | Status: DC | PRN

## 2016-08-06 NOTE — Discharge Instructions (Signed)
[facility] _______[facility]  UMBILICAL OR INGUINAL HERNIA REPAIR: POST OP INSTRUCTIONS  Always review your discharge instruction sheet given to you by the facility where your surgery was performed. IF YOU HAVE DISABILITY OR FAMILY LEAVE FORMS, YOU MUST BRING THEM TO THE OFFICE FOR PROCESSING.   DO NOT GIVE THEM TO YOUR DOCTOR.  1. A  prescription for pain medication may be given to you upon discharge.  Take your pain medication as prescribed, if needed.  If narcotic pain medicine is not needed, then you may take acetaminophen (Tylenol) or ibuprofen (Advil) as needed. 2. Take your usually prescribed medications unless otherwise directed. 3. If you need a refill on your pain medication, please contact your pharmacy.  They will contact our office to request authorization. Prescriptions will not be filled after 5 pm or on week-ends. 4. You should follow a light diet the first 24 hours after arrival home, such as soup and crackers, etc.  Be sure to include lots of fluids daily.  Resume your normal diet the day after surgery. 5. Most patients will experience some swelling and bruising around the umbilicus or in the groin and scrotum.  Ice packs and reclining will help.  Swelling and bruising can take several days to resolve.  6. It is common to experience some constipation if taking pain medication after surgery.  Increasing fluid intake and taking a stool softener (such as Colace) will usually help or prevent this problem from occurring.  A mild laxative (Milk of Magnesia or Miralax) should be taken according to package directions if there are no bowel movements after 48 hours. 7. Unless discharge instructions indicate otherwise, you may remove your bandages 24-48 hours after surgery, and you may shower at that time.  You may have steri-strips (small skin tapes) in place directly over the incision.  These strips should be left on the skin for 7-10 days.  If your surgeon used skin glue on the  incision, you may shower in 24 hours.  The glue will flake off over the next 2-3 weeks.  Any sutures or staples will be removed at the office during your follow-up visit. 8. ACTIVITIES:  You may resume regular (light) daily activities beginning the next day--such as daily self-care, walking, climbing stairs--gradually increasing activities as tolerated.  You may have sexual intercourse when it is comfortable.  Refrain from any heavy lifting or straining until approved by your doctor. a. You may drive when you are no longer taking prescription pain medication, you can comfortably wear a seatbelt, and you can safely maneuver your car and apply brakes. b. RETURN TO WORK:  __________________________________________________________ 9. You should see your doctor in the office for a follow-up appointment approximately 2-3 weeks after your surgery.  Make sure that you call for this appointment within a day or two after you arrive home to insure a convenient appointment time. 10. OTHER INSTRUCTIONS:  __________________________________________________________________________________________________________________________________________________________________________________________  WHEN TO CALL YOUR DOCTOR: 1. Fever over 101.0 2. Inability to urinate 3. Nausea and/or vomiting 4. Extreme swelling or bruising 5. Continued bleeding from incision. 6. Increased pain, redness, or drainage from the incision  The clinic staff is available to answer your questions during regular business hours.  Please don't hesitate to call and ask to speak to one of the nurses for clinical concerns.  If you have a medical emergency, go to the nearest emergency room or call 911.  A surgeon from [facility] is always on call at the hospital     [address], [address], [place],   [address] ?  [address] [address], [place],    [address] [phone] ? [phone] ? FAX [phone] Web site:  [email or web address]  

## 2016-08-06 NOTE — Progress Notes (Signed)
Seen on HD today. Goal 4500. SBP 130s K 6 - for 4 hr HD  =should be ok hgb 12 post op No changes in dialysis orders at discharge except adjust heparin for short term. Doing well  Amalia Hailey, PA-C

## 2016-08-06 NOTE — Progress Notes (Signed)
Patient discharged to home with family. All discharge instructions reviewed with patient, all scripts and follow up appointments. IV removed. Patient has all belongings with the patient. Patient left the floor in stable condition.  Sheliah Plane RN

## 2016-08-06 NOTE — Discharge Summary (Signed)
Physician Discharge Summary  Patient ID: [name] [name] MRN: [id number] DOB/AGE: 05-06-1966 50 y.o.  Admit date: 08/05/2016 Discharge date: 08/06/2016  Admission Diagnoses:  Right inguinal hernia     Umbilical hernia   Discharge Diagnoses: same Active Problems:   Hernia, inguinal, right   Discharged Condition: good  Hospital Course: Open repair of large direct right inguinal hernia/ umbilical hernia with mesh on 08/05/16.  Kept overnight for observation and dialysis.  He normally dialyzes MWF.  He will be discharged after dialysis this morning.  Consults: nephrology  Significant Diagnostic Studies: labs:  Lab Results  Component Value Date   CREATININE 10.73 (H) 08/06/2016   BUN 71 (H) 08/06/2016   NA 133 (L) 08/06/2016   K 6.0 (H) 08/06/2016   CL 94 (L) 08/06/2016   CO2 22 08/06/2016   Lab Results  Component Value Date   WBC 8.9 08/06/2016   HGB 12.0 (L) 08/06/2016   HCT 36.8 (L) 08/06/2016   MCV 94.8 08/06/2016   PLT 174 08/06/2016     Treatments: surgery: see above and dialysis  Discharge Exam: Blood pressure 129/85, pulse 64, temperature 98.5 F (36.9 C), temperature source Oral, resp. rate 16, height 5\' 9"  (1.753 m), weight 76.7 kg (169 lb), SpO2 93 %. General appearance: alert, cooperative and no distress GI: tender around umbilicus; dressing dry Right inguinal incision - minimal swelling; dressing dry  Disposition: 01-Home or Self Care  Discharge Instructions    Call MD for:  persistant nausea and vomiting    Complete by:  As directed   Call MD for:  redness, tenderness, or signs of infection (pain, swelling, redness, odor or green/yellow discharge around incision site)    Complete by:  As directed   Call MD for:  severe uncontrolled pain    Complete by:  As directed   Call MD for:  temperature >100.4    Complete by:  As directed   Diet general    Complete by:  As directed   Driving Restrictions    Complete by:  As directed   Do not drive while taking  pain medications   Increase activity slowly    Complete by:  As directed   May shower / Bathe    Complete by:  As directed   Tomorrow - 08/07/16   Remove dressing in 24 hours    Complete by:  As directed        Medication List    TAKE these medications   calcitRIOL 0.5 MCG capsule Commonly known as:  ROCALTROL Take 1 capsule (0.5 mcg total) by mouth every Monday, Wednesday, and Friday with hemodialysis.   calcium acetate 667 MG capsule Commonly known as:  PHOSLO Take 3 capsules (2,001 mg total) by mouth 3 (three) times daily with meals.   carvedilol 25 MG tablet Commonly known as:  COREG Take 25 mg by mouth 2 (two) times daily.   clonazePAM 0.5 MG tablet Commonly known as:  KLONOPIN Take 0.5 mg by mouth daily as needed for anxiety.   isosorbide mononitrate 60 MG 24 hr tablet Commonly known as:  IMDUR Take 60 mg by mouth daily.   losartan 25 MG tablet Commonly known as:  COZAAR Take 1 tablet (25 mg total) by mouth daily.   oxyCODONE 5 MG immediate release tablet Commonly known as:  Oxy IR/ROXICODONE Take 1-2 tablets (5-10 mg total) by mouth every 4 (four) hours as needed for moderate pain.       Follow-up Information    [name] Blayney  K., MD. Schedule an appointment as soon as possible for a visit in 3 week(s).   Specialty:  General Surgery Contact information: [address] [place]  [address] [phone]           Signed: Maia Petties. 08/06/2016, 7:44 AM

## 2016-08-07 LAB — HEPATITIS B SURFACE ANTIGEN: Hepatitis B Surface Ag: NEGATIVE

## 2016-08-08 DIAGNOSIS — D509 Iron deficiency anemia, unspecified: Secondary | ICD-10-CM | POA: Diagnosis not present

## 2016-08-08 DIAGNOSIS — N186 End stage renal disease: Secondary | ICD-10-CM | POA: Diagnosis not present

## 2016-08-08 DIAGNOSIS — N2581 Secondary hyperparathyroidism of renal origin: Secondary | ICD-10-CM | POA: Diagnosis not present

## 2016-08-11 DIAGNOSIS — N2581 Secondary hyperparathyroidism of renal origin: Secondary | ICD-10-CM | POA: Diagnosis not present

## 2016-08-11 DIAGNOSIS — D509 Iron deficiency anemia, unspecified: Secondary | ICD-10-CM | POA: Diagnosis not present

## 2016-08-11 DIAGNOSIS — N186 End stage renal disease: Secondary | ICD-10-CM | POA: Diagnosis not present

## 2016-08-13 ENCOUNTER — Encounter (HOSPITAL_COMMUNITY): Payer: Self-pay | Admitting: Surgery

## 2016-08-13 DIAGNOSIS — D509 Iron deficiency anemia, unspecified: Secondary | ICD-10-CM | POA: Diagnosis not present

## 2016-08-13 DIAGNOSIS — N186 End stage renal disease: Secondary | ICD-10-CM | POA: Diagnosis not present

## 2016-08-13 DIAGNOSIS — N2581 Secondary hyperparathyroidism of renal origin: Secondary | ICD-10-CM | POA: Diagnosis not present

## 2016-08-14 DIAGNOSIS — N186 End stage renal disease: Secondary | ICD-10-CM | POA: Diagnosis not present

## 2016-08-14 DIAGNOSIS — I120 Hypertensive chronic kidney disease with stage 5 chronic kidney disease or end stage renal disease: Secondary | ICD-10-CM | POA: Diagnosis not present

## 2016-08-14 DIAGNOSIS — Z992 Dependence on renal dialysis: Secondary | ICD-10-CM | POA: Diagnosis not present

## 2016-08-15 DIAGNOSIS — Z23 Encounter for immunization: Secondary | ICD-10-CM | POA: Diagnosis not present

## 2016-08-15 DIAGNOSIS — N186 End stage renal disease: Secondary | ICD-10-CM | POA: Diagnosis not present

## 2016-08-15 DIAGNOSIS — N2581 Secondary hyperparathyroidism of renal origin: Secondary | ICD-10-CM | POA: Diagnosis not present

## 2016-08-15 DIAGNOSIS — D509 Iron deficiency anemia, unspecified: Secondary | ICD-10-CM | POA: Diagnosis not present

## 2016-08-19 ENCOUNTER — Ambulatory Visit: Payer: Medicare Other | Admitting: Physician Assistant

## 2016-08-19 NOTE — Progress Notes (Deleted)
Cardiology Office Note    Date:  08/19/2016   ID:  [name] [name], DOB August 31, 1966, MRN [id number]  PCP:  Pcp Not In System  Cardiologist:   No chief complaint on file.   History of Present Illness:  [name] [name] is a 50 y.o. male with a hx of HTN, ESRD on HD, polysubstance abuse including cocaine and ETOH, combined systolic and diastolic CHF, secondary hyperparathyroidism, anemia, VT in the setting of cocaine and HTN urgency in 2011, medical non-adherence.  Prior EF was 40-45% in 2013.     Admitted in 3/17 with volume overload in the setting of 2 missed dialysis sessions requiring emergent dialysis.  Echo demonstrated EF 123XX123, mod diastolic dysfunction, PASP 55 mmHg.  He was noted to have NSVT on Tele in the hospital.  Cardiac catheterization demonstrated normal coronary arteries. He was seen by electrophysiology. Continued medical therapy was recommended.    Patient saw [name] Dopp PA-C for surgical clearance.He underwent right inguinal hernia and umbilical hernia repair 123XX123.       Past Medical History:  Diagnosis Date  . Combined congestive systolic and diastolic heart failure ([place]) 04/18/2014   A. Echo 8/13: Severe LVH, EF 40-45%, inferoposterior akinesis, grade 2 diastolic dysfunction, moderate LAE, mild RVE, mildly reduced RVSF, mild RAE; cannot rule out R atrial mass-suggest TEE or cardiac MRI  //  B. Echo 3/17: Mild LVH, EF 25-30%, diffuse HK, grade 2 diastolic dysfunction, mild MR, severe LAE,  moderately reduced RVSF, severe RAE, mild TR, moderate PI, PASP 55 mmHg    . ESRD (end stage renal disease) ([place])   . Hemodialysis patient ([place])    M,W,F  . Hypertension   . Medical non-compliance   . Non-ischemic cardiomyopathy ([place])    A. R/L HC 3/17: Normal coronary arteries, moderate pulmonary hypertension (PASP 65 mmHg), elevated LV filling pressures (LVEDP 45 mmHg)   . NSVT (nonsustained ventricular tachycardia) ([place]) 02/22/2016  . Polysubstance abuse   . Restless leg  syndrome     Past Surgical History:  Procedure Laterality Date  . [place] TRANSPOSITION  01/19/2013   Procedure: BASCILIC VEIN TRANSPOSITION;  Surgeon: Conrad [place], MD;  Location: [facility];  Service: Vascular;  Laterality: Left;  left 2nd stage basilic vein transposition  . CARDIAC CATHETERIZATION N/A 02/25/2016   Procedure: Right/Left Heart Cath and Coronary Angiography;  Surgeon: Burnell Blanks, MD;  Location: [facility] CV LAB;  Service: Cardiovascular;  Laterality: N/A;  . INGUINAL HERNIA REPAIR Right 08/05/2016   Procedure: RIGHT INGUINAL HERNIA REPAIR WITH MESH;  Surgeon: Donnie Mesa, MD;  Location: [facility];  Service: General;  Laterality: Right;  . INSERTION OF DIALYSIS CATHETER  07/17/2012   Procedure: INSERTION OF DIALYSIS CATHETER;  Surgeon: Conrad [place], MD;  Location: [facility];  Service: Vascular;  Laterality: Right;  right internal jugular  . INSERTION OF MESH Right 08/05/2016   Procedure: INSERTION OF MESH;  Surgeon: Donnie Mesa, MD;  Location: [facility];  Service: General;  Laterality: Right;  . UMBILICAL HERNIA REPAIR N/A 08/05/2016   Procedure: [place];  Surgeon: Donnie Mesa, MD;  Location: [facility];  Service: General;  Laterality: N/A;  . VENOGRAM N/A 08/09/2012   Procedure: VENOGRAM;  Surgeon: Conrad Trenton, MD;  Location: [facility] CATH LAB;  Service: Cardiovascular;  Laterality: N/A;    Current Medications: Outpatient Medications Prior to Visit  Medication Sig Dispense Refill  . calcitRIOL (ROCALTROL) 0.5 MCG capsule Take 1 capsule (0.5 mcg total) by mouth  every Monday, Wednesday, and Friday with hemodialysis. (Patient not taking: Reported on 08/01/2016)    . calcium acetate (PHOSLO) 667 MG capsule Take 3 capsules (2,001 mg total) by mouth 3 (three) times daily with meals. 90 capsule 0  . carvedilol (COREG) 25 MG tablet Take 25 mg by mouth 2 (two) times daily.    . clonazePAM (KLONOPIN) 0.5 MG tablet Take 0.5 mg by mouth daily as needed for anxiety.      . isosorbide mononitrate (IMDUR) 60 MG 24 hr tablet Take 60 mg by mouth daily.    Marland Kitchen losartan (COZAAR) 25 MG tablet Take 1 tablet (25 mg total) by mouth daily. (Patient not taking: Reported on 08/01/2016) 90 tablet 3  . oxyCODONE (OXY IR/ROXICODONE) 5 MG immediate release tablet Take 1-2 tablets (5-10 mg total) by mouth every 4 (four) hours as needed for moderate pain. 40 tablet 0   No facility-administered medications prior to visit.      Allergies:   Losartan   Social History   Social History  . Marital status: Single    Spouse name: N/A  . Number of children: N/A  . Years of education: N/A   Social History Main Topics  . Smoking status: Current Every Day Smoker    Packs/day: 0.25    Years: 10.00    Types: Cigarettes    Last attempt to quit: 08/09/2012  . Smokeless tobacco: Current User     Comment: Trying to quit; 3 cigs/last 2 weeks  . Alcohol use 4.2 oz/week    7 Cans of beer per week     Comment: Patient not forthcoming.  2-3 beers per week  . Drug use: No     Comment: Marijuana  . Sexual activity: Not Currently   Other Topics Concern  . Not on file   Social History Narrative  . No narrative on file     Family History:  The patient's ***family history includes Diabetes in his mother; Hypertension in his father and mother.   ROS:   Please see the history of present illness.    ROS All other systems reviewed and are negative.   PHYSICAL EXAM:   VS:  There were no vitals taken for this visit.  Physical Exam  GEN: Well nourished, well developed, in no acute distress  HEENT: normal  Neck: no JVD, carotid bruits, or masses Cardiac:RRR; no murmurs, rubs, or gallops  Respiratory:  clear to auscultation bilaterally, normal work of breathing GI: soft, nontender, nondistended, + BS Ext: without cyanosis, clubbing, or edema, Good distal pulses bilaterally MS: no deformity or atrophy  Skin: warm and dry, no rash Neuro:  Alert and Oriented x 3, Strength and sensation  are intact Psych: euthymic mood, full affect  Wt Readings from Last 3 Encounters:  08/06/16 167 lb 5.3 oz (75.9 kg)  08/04/16 169 lb 12.8 oz (77 kg)  07/22/16 173 lb 12.8 oz (78.8 kg)      Studies/Labs Reviewed:   EKG:  EKG is*** ordered today.  The ekg ordered today demonstrates ***  Recent Labs: 02/21/2016: Magnesium 2.3 08/05/2016: ALT 12 08/06/2016: BUN 71; Creatinine, Ser 10.73; Hemoglobin 12.0; Platelets 174; Potassium 6.0; Sodium 133   Lipid Panel    Component Value Date/Time   CHOL 68 07/17/2012 0520   TRIG 103 07/17/2012 0520   HDL 33 (L) 07/17/2012 0520   CHOLHDL 2.1 07/17/2012 0520   VLDL 21 07/17/2012 0520   LDLCALC 14 07/17/2012 0520    Additional studies/ records that were  reviewed today include:  ***    ASSESSMENT:    No diagnosis found.   PLAN:  In order of problems listed above:      Medication Adjustments/Labs and Tests Ordered: Current medicines are reviewed at length with the patient today.  Concerns regarding medicines are outlined above.  Medication changes, Labs and Tests ordered today are listed in the Patient Instructions below. There are no Patient Instructions on file for this visit.   Sumner Boast, PA-C  08/19/2016 10:31 AM    [facility], [place],   [address] Phone: [phone]; Fax: [phone]

## 2016-08-22 ENCOUNTER — Encounter: Payer: Self-pay | Admitting: Physician Assistant

## 2016-09-04 ENCOUNTER — Encounter: Payer: Self-pay | Admitting: Cardiology

## 2016-09-04 ENCOUNTER — Encounter: Payer: Self-pay | Admitting: Physician Assistant

## 2016-09-13 DIAGNOSIS — Z992 Dependence on renal dialysis: Secondary | ICD-10-CM | POA: Diagnosis not present

## 2016-09-13 DIAGNOSIS — N186 End stage renal disease: Secondary | ICD-10-CM | POA: Diagnosis not present

## 2016-09-13 DIAGNOSIS — I120 Hypertensive chronic kidney disease with stage 5 chronic kidney disease or end stage renal disease: Secondary | ICD-10-CM | POA: Diagnosis not present

## 2016-09-15 DIAGNOSIS — N186 End stage renal disease: Secondary | ICD-10-CM | POA: Diagnosis not present

## 2016-09-15 DIAGNOSIS — N2581 Secondary hyperparathyroidism of renal origin: Secondary | ICD-10-CM | POA: Diagnosis not present

## 2016-09-29 ENCOUNTER — Encounter: Payer: Self-pay | Admitting: Vascular Surgery

## 2016-10-02 ENCOUNTER — Ambulatory Visit: Payer: Medicare Other | Admitting: Vascular Surgery

## 2016-10-09 ENCOUNTER — Encounter: Payer: Self-pay | Admitting: Vascular Surgery

## 2016-10-14 DIAGNOSIS — I120 Hypertensive chronic kidney disease with stage 5 chronic kidney disease or end stage renal disease: Secondary | ICD-10-CM | POA: Diagnosis not present

## 2016-10-14 DIAGNOSIS — Z992 Dependence on renal dialysis: Secondary | ICD-10-CM | POA: Diagnosis not present

## 2016-10-14 DIAGNOSIS — N186 End stage renal disease: Secondary | ICD-10-CM | POA: Diagnosis not present

## 2016-10-15 ENCOUNTER — Ambulatory Visit: Payer: Medicare Other | Admitting: Vascular Surgery

## 2016-10-15 DIAGNOSIS — N2581 Secondary hyperparathyroidism of renal origin: Secondary | ICD-10-CM | POA: Diagnosis not present

## 2016-10-15 DIAGNOSIS — N186 End stage renal disease: Secondary | ICD-10-CM | POA: Diagnosis not present

## 2016-10-15 DIAGNOSIS — D509 Iron deficiency anemia, unspecified: Secondary | ICD-10-CM | POA: Diagnosis not present

## 2016-10-15 DIAGNOSIS — Z23 Encounter for immunization: Secondary | ICD-10-CM | POA: Diagnosis not present

## 2016-10-17 DIAGNOSIS — Z23 Encounter for immunization: Secondary | ICD-10-CM | POA: Diagnosis not present

## 2016-10-17 DIAGNOSIS — D509 Iron deficiency anemia, unspecified: Secondary | ICD-10-CM | POA: Diagnosis not present

## 2016-10-17 DIAGNOSIS — N2581 Secondary hyperparathyroidism of renal origin: Secondary | ICD-10-CM | POA: Diagnosis not present

## 2016-10-17 DIAGNOSIS — N186 End stage renal disease: Secondary | ICD-10-CM | POA: Diagnosis not present

## 2016-10-20 DIAGNOSIS — Z23 Encounter for immunization: Secondary | ICD-10-CM | POA: Diagnosis not present

## 2016-10-20 DIAGNOSIS — N186 End stage renal disease: Secondary | ICD-10-CM | POA: Diagnosis not present

## 2016-10-20 DIAGNOSIS — N2581 Secondary hyperparathyroidism of renal origin: Secondary | ICD-10-CM | POA: Diagnosis not present

## 2016-10-20 DIAGNOSIS — D509 Iron deficiency anemia, unspecified: Secondary | ICD-10-CM | POA: Diagnosis not present

## 2016-10-22 ENCOUNTER — Ambulatory Visit: Payer: Medicare Other | Admitting: Vascular Surgery

## 2016-10-22 DIAGNOSIS — N2581 Secondary hyperparathyroidism of renal origin: Secondary | ICD-10-CM | POA: Diagnosis not present

## 2016-10-22 DIAGNOSIS — D509 Iron deficiency anemia, unspecified: Secondary | ICD-10-CM | POA: Diagnosis not present

## 2016-10-22 DIAGNOSIS — Z23 Encounter for immunization: Secondary | ICD-10-CM | POA: Diagnosis not present

## 2016-10-22 DIAGNOSIS — N186 End stage renal disease: Secondary | ICD-10-CM | POA: Diagnosis not present

## 2016-10-24 DIAGNOSIS — Z23 Encounter for immunization: Secondary | ICD-10-CM | POA: Diagnosis not present

## 2016-10-24 DIAGNOSIS — D509 Iron deficiency anemia, unspecified: Secondary | ICD-10-CM | POA: Diagnosis not present

## 2016-10-24 DIAGNOSIS — N186 End stage renal disease: Secondary | ICD-10-CM | POA: Diagnosis not present

## 2016-10-24 DIAGNOSIS — N2581 Secondary hyperparathyroidism of renal origin: Secondary | ICD-10-CM | POA: Diagnosis not present

## 2016-10-27 DIAGNOSIS — N2581 Secondary hyperparathyroidism of renal origin: Secondary | ICD-10-CM | POA: Diagnosis not present

## 2016-10-27 DIAGNOSIS — D509 Iron deficiency anemia, unspecified: Secondary | ICD-10-CM | POA: Diagnosis not present

## 2016-10-27 DIAGNOSIS — Z23 Encounter for immunization: Secondary | ICD-10-CM | POA: Diagnosis not present

## 2016-10-27 DIAGNOSIS — N186 End stage renal disease: Secondary | ICD-10-CM | POA: Diagnosis not present

## 2016-10-29 DIAGNOSIS — D509 Iron deficiency anemia, unspecified: Secondary | ICD-10-CM | POA: Diagnosis not present

## 2016-10-29 DIAGNOSIS — Z23 Encounter for immunization: Secondary | ICD-10-CM | POA: Diagnosis not present

## 2016-10-29 DIAGNOSIS — N186 End stage renal disease: Secondary | ICD-10-CM | POA: Diagnosis not present

## 2016-10-29 DIAGNOSIS — N2581 Secondary hyperparathyroidism of renal origin: Secondary | ICD-10-CM | POA: Diagnosis not present

## 2016-10-31 DIAGNOSIS — N186 End stage renal disease: Secondary | ICD-10-CM | POA: Diagnosis not present

## 2016-10-31 DIAGNOSIS — Z23 Encounter for immunization: Secondary | ICD-10-CM | POA: Diagnosis not present

## 2016-10-31 DIAGNOSIS — N2581 Secondary hyperparathyroidism of renal origin: Secondary | ICD-10-CM | POA: Diagnosis not present

## 2016-10-31 DIAGNOSIS — D509 Iron deficiency anemia, unspecified: Secondary | ICD-10-CM | POA: Diagnosis not present

## 2016-11-02 DIAGNOSIS — N2581 Secondary hyperparathyroidism of renal origin: Secondary | ICD-10-CM | POA: Diagnosis not present

## 2016-11-02 DIAGNOSIS — Z23 Encounter for immunization: Secondary | ICD-10-CM | POA: Diagnosis not present

## 2016-11-02 DIAGNOSIS — N186 End stage renal disease: Secondary | ICD-10-CM | POA: Diagnosis not present

## 2016-11-02 DIAGNOSIS — D509 Iron deficiency anemia, unspecified: Secondary | ICD-10-CM | POA: Diagnosis not present

## 2016-11-07 DIAGNOSIS — N2581 Secondary hyperparathyroidism of renal origin: Secondary | ICD-10-CM | POA: Diagnosis not present

## 2016-11-07 DIAGNOSIS — N186 End stage renal disease: Secondary | ICD-10-CM | POA: Diagnosis not present

## 2016-11-07 DIAGNOSIS — Z23 Encounter for immunization: Secondary | ICD-10-CM | POA: Diagnosis not present

## 2016-11-07 DIAGNOSIS — D509 Iron deficiency anemia, unspecified: Secondary | ICD-10-CM | POA: Diagnosis not present

## 2016-11-10 DIAGNOSIS — N186 End stage renal disease: Secondary | ICD-10-CM | POA: Diagnosis not present

## 2016-11-10 DIAGNOSIS — Z23 Encounter for immunization: Secondary | ICD-10-CM | POA: Diagnosis not present

## 2016-11-10 DIAGNOSIS — N2581 Secondary hyperparathyroidism of renal origin: Secondary | ICD-10-CM | POA: Diagnosis not present

## 2016-11-10 DIAGNOSIS — D509 Iron deficiency anemia, unspecified: Secondary | ICD-10-CM | POA: Diagnosis not present

## 2016-11-12 DIAGNOSIS — N2581 Secondary hyperparathyroidism of renal origin: Secondary | ICD-10-CM | POA: Diagnosis not present

## 2016-11-12 DIAGNOSIS — Z23 Encounter for immunization: Secondary | ICD-10-CM | POA: Diagnosis not present

## 2016-11-12 DIAGNOSIS — D509 Iron deficiency anemia, unspecified: Secondary | ICD-10-CM | POA: Diagnosis not present

## 2016-11-12 DIAGNOSIS — N186 End stage renal disease: Secondary | ICD-10-CM | POA: Diagnosis not present

## 2016-11-13 DIAGNOSIS — N186 End stage renal disease: Secondary | ICD-10-CM | POA: Diagnosis not present

## 2016-11-13 DIAGNOSIS — I120 Hypertensive chronic kidney disease with stage 5 chronic kidney disease or end stage renal disease: Secondary | ICD-10-CM | POA: Diagnosis not present

## 2016-11-13 DIAGNOSIS — Z992 Dependence on renal dialysis: Secondary | ICD-10-CM | POA: Diagnosis not present

## 2016-11-17 ENCOUNTER — Encounter: Payer: Self-pay | Admitting: Vascular Surgery

## 2016-11-17 DIAGNOSIS — N186 End stage renal disease: Secondary | ICD-10-CM | POA: Diagnosis not present

## 2016-11-17 DIAGNOSIS — D509 Iron deficiency anemia, unspecified: Secondary | ICD-10-CM | POA: Diagnosis not present

## 2016-11-17 DIAGNOSIS — N2581 Secondary hyperparathyroidism of renal origin: Secondary | ICD-10-CM | POA: Diagnosis not present

## 2016-11-19 DIAGNOSIS — N186 End stage renal disease: Secondary | ICD-10-CM | POA: Diagnosis not present

## 2016-11-19 DIAGNOSIS — D509 Iron deficiency anemia, unspecified: Secondary | ICD-10-CM | POA: Diagnosis not present

## 2016-11-19 DIAGNOSIS — N2581 Secondary hyperparathyroidism of renal origin: Secondary | ICD-10-CM | POA: Diagnosis not present

## 2016-11-20 ENCOUNTER — Ambulatory Visit: Payer: Medicare Other | Admitting: Vascular Surgery

## 2016-11-21 DIAGNOSIS — D509 Iron deficiency anemia, unspecified: Secondary | ICD-10-CM | POA: Diagnosis not present

## 2016-11-21 DIAGNOSIS — N186 End stage renal disease: Secondary | ICD-10-CM | POA: Diagnosis not present

## 2016-11-21 DIAGNOSIS — N2581 Secondary hyperparathyroidism of renal origin: Secondary | ICD-10-CM | POA: Diagnosis not present

## 2016-11-24 DIAGNOSIS — N2581 Secondary hyperparathyroidism of renal origin: Secondary | ICD-10-CM | POA: Diagnosis not present

## 2016-11-24 DIAGNOSIS — D509 Iron deficiency anemia, unspecified: Secondary | ICD-10-CM | POA: Diagnosis not present

## 2016-11-24 DIAGNOSIS — N186 End stage renal disease: Secondary | ICD-10-CM | POA: Diagnosis not present

## 2016-11-26 DIAGNOSIS — N2581 Secondary hyperparathyroidism of renal origin: Secondary | ICD-10-CM | POA: Diagnosis not present

## 2016-11-26 DIAGNOSIS — N186 End stage renal disease: Secondary | ICD-10-CM | POA: Diagnosis not present

## 2016-11-26 DIAGNOSIS — D509 Iron deficiency anemia, unspecified: Secondary | ICD-10-CM | POA: Diagnosis not present

## 2016-11-28 DIAGNOSIS — D509 Iron deficiency anemia, unspecified: Secondary | ICD-10-CM | POA: Diagnosis not present

## 2016-11-28 DIAGNOSIS — N2581 Secondary hyperparathyroidism of renal origin: Secondary | ICD-10-CM | POA: Diagnosis not present

## 2016-11-28 DIAGNOSIS — N186 End stage renal disease: Secondary | ICD-10-CM | POA: Diagnosis not present

## 2016-12-01 DIAGNOSIS — D509 Iron deficiency anemia, unspecified: Secondary | ICD-10-CM | POA: Diagnosis not present

## 2016-12-01 DIAGNOSIS — N2581 Secondary hyperparathyroidism of renal origin: Secondary | ICD-10-CM | POA: Diagnosis not present

## 2016-12-01 DIAGNOSIS — N186 End stage renal disease: Secondary | ICD-10-CM | POA: Diagnosis not present

## 2016-12-03 DIAGNOSIS — N186 End stage renal disease: Secondary | ICD-10-CM | POA: Diagnosis not present

## 2016-12-03 DIAGNOSIS — D509 Iron deficiency anemia, unspecified: Secondary | ICD-10-CM | POA: Diagnosis not present

## 2016-12-03 DIAGNOSIS — N2581 Secondary hyperparathyroidism of renal origin: Secondary | ICD-10-CM | POA: Diagnosis not present

## 2016-12-05 DIAGNOSIS — N2581 Secondary hyperparathyroidism of renal origin: Secondary | ICD-10-CM | POA: Diagnosis not present

## 2016-12-05 DIAGNOSIS — N186 End stage renal disease: Secondary | ICD-10-CM | POA: Diagnosis not present

## 2016-12-05 DIAGNOSIS — D509 Iron deficiency anemia, unspecified: Secondary | ICD-10-CM | POA: Diagnosis not present

## 2016-12-10 DIAGNOSIS — D509 Iron deficiency anemia, unspecified: Secondary | ICD-10-CM | POA: Diagnosis not present

## 2016-12-10 DIAGNOSIS — N186 End stage renal disease: Secondary | ICD-10-CM | POA: Diagnosis not present

## 2016-12-10 DIAGNOSIS — N2581 Secondary hyperparathyroidism of renal origin: Secondary | ICD-10-CM | POA: Diagnosis not present

## 2016-12-12 DIAGNOSIS — N186 End stage renal disease: Secondary | ICD-10-CM | POA: Diagnosis not present

## 2016-12-12 DIAGNOSIS — D509 Iron deficiency anemia, unspecified: Secondary | ICD-10-CM | POA: Diagnosis not present

## 2016-12-12 DIAGNOSIS — N2581 Secondary hyperparathyroidism of renal origin: Secondary | ICD-10-CM | POA: Diagnosis not present

## 2016-12-14 DIAGNOSIS — Z992 Dependence on renal dialysis: Secondary | ICD-10-CM | POA: Diagnosis not present

## 2016-12-14 DIAGNOSIS — I120 Hypertensive chronic kidney disease with stage 5 chronic kidney disease or end stage renal disease: Secondary | ICD-10-CM | POA: Diagnosis not present

## 2016-12-14 DIAGNOSIS — N2581 Secondary hyperparathyroidism of renal origin: Secondary | ICD-10-CM | POA: Diagnosis not present

## 2016-12-14 DIAGNOSIS — N186 End stage renal disease: Secondary | ICD-10-CM | POA: Diagnosis not present

## 2016-12-14 DIAGNOSIS — D509 Iron deficiency anemia, unspecified: Secondary | ICD-10-CM | POA: Diagnosis not present

## 2016-12-17 DIAGNOSIS — D509 Iron deficiency anemia, unspecified: Secondary | ICD-10-CM | POA: Diagnosis not present

## 2016-12-17 DIAGNOSIS — N2581 Secondary hyperparathyroidism of renal origin: Secondary | ICD-10-CM | POA: Diagnosis not present

## 2016-12-17 DIAGNOSIS — N186 End stage renal disease: Secondary | ICD-10-CM | POA: Diagnosis not present

## 2016-12-19 DIAGNOSIS — D509 Iron deficiency anemia, unspecified: Secondary | ICD-10-CM | POA: Diagnosis not present

## 2016-12-19 DIAGNOSIS — N2581 Secondary hyperparathyroidism of renal origin: Secondary | ICD-10-CM | POA: Diagnosis not present

## 2016-12-19 DIAGNOSIS — N186 End stage renal disease: Secondary | ICD-10-CM | POA: Diagnosis not present

## 2016-12-22 DIAGNOSIS — D509 Iron deficiency anemia, unspecified: Secondary | ICD-10-CM | POA: Diagnosis not present

## 2016-12-22 DIAGNOSIS — N2581 Secondary hyperparathyroidism of renal origin: Secondary | ICD-10-CM | POA: Diagnosis not present

## 2016-12-22 DIAGNOSIS — N186 End stage renal disease: Secondary | ICD-10-CM | POA: Diagnosis not present

## 2016-12-24 DIAGNOSIS — D509 Iron deficiency anemia, unspecified: Secondary | ICD-10-CM | POA: Diagnosis not present

## 2016-12-24 DIAGNOSIS — N2581 Secondary hyperparathyroidism of renal origin: Secondary | ICD-10-CM | POA: Diagnosis not present

## 2016-12-24 DIAGNOSIS — N186 End stage renal disease: Secondary | ICD-10-CM | POA: Diagnosis not present

## 2016-12-25 ENCOUNTER — Encounter: Payer: Self-pay | Admitting: Family

## 2016-12-26 DIAGNOSIS — N186 End stage renal disease: Secondary | ICD-10-CM | POA: Diagnosis not present

## 2016-12-26 DIAGNOSIS — D509 Iron deficiency anemia, unspecified: Secondary | ICD-10-CM | POA: Diagnosis not present

## 2016-12-26 DIAGNOSIS — N2581 Secondary hyperparathyroidism of renal origin: Secondary | ICD-10-CM | POA: Diagnosis not present

## 2016-12-29 DIAGNOSIS — N2581 Secondary hyperparathyroidism of renal origin: Secondary | ICD-10-CM | POA: Diagnosis not present

## 2016-12-29 DIAGNOSIS — D509 Iron deficiency anemia, unspecified: Secondary | ICD-10-CM | POA: Diagnosis not present

## 2016-12-29 DIAGNOSIS — N186 End stage renal disease: Secondary | ICD-10-CM | POA: Diagnosis not present

## 2016-12-30 ENCOUNTER — Other Ambulatory Visit: Payer: Self-pay

## 2016-12-30 ENCOUNTER — Ambulatory Visit (INDEPENDENT_AMBULATORY_CARE_PROVIDER_SITE_OTHER): Payer: Medicare Other | Admitting: Vascular Surgery

## 2016-12-30 VITALS — BP 151/106 | HR 74 | Temp 97.4°F | Resp 18 | Ht 69.0 in | Wt 172.8 lb

## 2016-12-30 DIAGNOSIS — T82898A Other specified complication of vascular prosthetic devices, implants and grafts, initial encounter: Secondary | ICD-10-CM

## 2016-12-30 NOTE — Progress Notes (Signed)
[facility]  Patient name: [name] [name] MRN: [id number] DOB: 09-08-1966 Sex: male  REASON FOR VISIT: scab left arm fistula  HPI: [name] [name] is a 51 y.o. male, who presents with 2-3 month history of a scab overlying his left upper arm fistula. The patient previously underwent left basilic vein transposition by Dr. Bridgett Larsson in 2014. He has been dialyzing on MWF without complications. He denies any bleeding issues. The scab did improve some last month with rest of the fistula, but started progressing again when he was stuck in that area.   He has a past medical history of CHF, hypertension and polysubstance abuse. He is not on blood thinners. He is a smoker. He denies any chest pain or shortness of breath.   Past Medical History:  Diagnosis Date  . Combined congestive systolic and diastolic heart failure ([place]) 04/18/2014   A. Echo 8/13: Severe LVH, EF 40-45%, inferoposterior akinesis, grade 2 diastolic dysfunction, moderate LAE, mild RVE, mildly reduced RVSF, mild RAE; cannot rule out R atrial mass-suggest TEE or cardiac MRI  //  B. Echo 3/17: Mild LVH, EF 25-30%, diffuse HK, grade 2 diastolic dysfunction, mild MR, severe LAE,  moderately reduced RVSF, severe RAE, mild TR, moderate PI, PASP 55 mmHg    . ESRD (end stage renal disease) ([place])   . Hemodialysis patient ([place])    M,W,F  . Hypertension   . Medical non-compliance   . Non-ischemic cardiomyopathy ([place])    A. R/L HC 3/17: Normal coronary arteries, moderate pulmonary hypertension (PASP 65 mmHg), elevated LV filling pressures (LVEDP 45 mmHg)   . NSVT (nonsustained ventricular tachycardia) ([place]) 02/22/2016  . Polysubstance abuse   . Restless leg syndrome     Family History  Problem Relation Age of Onset  . Hypertension Mother   . Diabetes Mother   . Hypertension Father     SOCIAL HISTORY: Social History   Social History  . Marital status: Single    Spouse name: N/A  . Number of children: N/A  .  Years of education: N/A   Occupational History  . Not on file.   Social History Main Topics  . Smoking status: Current Every Day Smoker    Packs/day: 0.25    Years: 10.00    Types: Cigarettes    Last attempt to quit: 08/09/2012  . Smokeless tobacco: Current User     Comment: Trying to quit; 3 cigs/last 2 weeks  . Alcohol use 4.2 oz/week    7 Cans of beer per week     Comment: Patient not forthcoming.  2-3 beers per week  . Drug use: No     Comment: Marijuana  . Sexual activity: Not Currently   Other Topics Concern  . Not on file   Social History Narrative  . No narrative on file    Allergies  Allergen Reactions  . Losartan Cough    Current Outpatient Prescriptions  Medication Sig Dispense Refill  . calcium acetate (PHOSLO) 667 MG capsule Take 3 capsules (2,001 mg total) by mouth 3 (three) times daily with meals. 90 capsule 0  . carvedilol (COREG) 25 MG tablet Take 25 mg by mouth 2 (two) times daily.    . clonazePAM (KLONOPIN) 0.5 MG tablet Take 0.5 mg by mouth daily as needed for anxiety.    . isosorbide mononitrate (IMDUR) 60 MG 24 hr tablet Take 60 mg by mouth daily.    . calcitRIOL (ROCALTROL) 0.5 MCG capsule Take 1 capsule (0.5 mcg  total) by mouth every Monday, Wednesday, and Friday with hemodialysis. (Patient not taking: Reported on 12/30/2016)    . losartan (COZAAR) 25 MG tablet Take 1 tablet (25 mg total) by mouth daily. (Patient not taking: Reported on 08/01/2016) 90 tablet 3  . oxyCODONE (OXY IR/ROXICODONE) 5 MG immediate release tablet Take 1-2 tablets (5-10 mg total) by mouth every 4 (four) hours as needed for moderate pain. (Patient not taking: Reported on 12/30/2016) 40 tablet 0   No current facility-administered medications for this visit.     REVIEW OF SYSTEMS:  [X]  denotes positive finding, [ ]  denotes negative finding Cardiac  Comments:  Chest pain or chest pressure:    Shortness of breath upon exertion:    Short of breath when lying flat:    Irregular  heart rhythm:        Vascular    Pain in calf, thigh, or hip brought on by ambulation:    Pain in feet at night that wakes you up from your sleep:     Blood clot in your veins:    Leg swelling:         Pulmonary    Oxygen at home:    Productive cough:     Wheezing:         Neurologic    Sudden weakness in arms or legs:     Sudden numbness in arms or legs:     Sudden onset of difficulty speaking or slurred speech:    Temporary loss of vision in one eye:     Problems with dizziness:         Gastrointestinal    Blood in stool:     Vomited blood:         Genitourinary    Burning when urinating:     Blood in urine:        Psychiatric    Major depression:         Hematologic    Bleeding problems:    Problems with blood clotting too easily:        Skin    Rashes or ulcers: x Scab to left upper arm fistula 2-3 months      Constitutional    Fever or chills:      PHYSICAL EXAM: Vitals:   12/30/16 1258 12/30/16 1300  BP: (!) 146/106 (!) 151/106  Pulse: 74   Resp: 18   Temp: 97.4 F (36.3 C)   TempSrc: Oral   SpO2: 97%   Weight: 172 lb 12.8 oz (78.4 kg)   Height: 5\' 9"  (1.753 m)     GENERAL: The patient is a well-nourished male, in no acute distress. The vital signs are documented above. CARDIAC: There is a regular rate and rhythm. No carotid bruits.  VASCULAR: Left upper arm fistula with some skin thinning and scab to mid portion of fistula. Minimally aneurysmal. The scab cannot be mobilized over the fistula. There is a larger aneurysmal segment just distal to the scab that is without any skin thinning or ulceration. Easily palpable thrill left upper arm fistula. 1+ left radial pulse.  PULMONARY: There is good air exchange bilaterally without wheezing or rales. MUSCULOSKELETAL: There are no major deformities or cyanosis. NEUROLOGIC: No focal weakness or paresthesias are detected. SKIN: See vascular section above. No other rashes appreciated.  PSYCHIATRIC: The  patient has a normal affect.  MEDICAL ISSUES: Ulceration left arm fistula ESRD on HD (MWF)  There is some skin thinning and a scab overlying the mid  portion of the patient's left upper arm fistula. He fortunately has not had any bleeding issues. Discussed that he is at increased risk of bleeding secondary to this. Dr. Donnetta Hutching also saw the patient. Recommend revision of his left arm fistula. He has adequate room on his fistula for cannulation while his surgical site heals. Plan for revision of left arm fistula on 01/06/17 with Dr. Patterson Hammersmith, PA-C [facility] MD: Early

## 2016-12-31 DIAGNOSIS — N186 End stage renal disease: Secondary | ICD-10-CM | POA: Diagnosis not present

## 2016-12-31 DIAGNOSIS — N2581 Secondary hyperparathyroidism of renal origin: Secondary | ICD-10-CM | POA: Diagnosis not present

## 2016-12-31 DIAGNOSIS — D509 Iron deficiency anemia, unspecified: Secondary | ICD-10-CM | POA: Diagnosis not present

## 2017-01-02 DIAGNOSIS — D509 Iron deficiency anemia, unspecified: Secondary | ICD-10-CM | POA: Diagnosis not present

## 2017-01-02 DIAGNOSIS — N186 End stage renal disease: Secondary | ICD-10-CM | POA: Diagnosis not present

## 2017-01-02 DIAGNOSIS — N2581 Secondary hyperparathyroidism of renal origin: Secondary | ICD-10-CM | POA: Diagnosis not present

## 2017-01-05 ENCOUNTER — Encounter (HOSPITAL_COMMUNITY): Payer: Self-pay | Admitting: *Deleted

## 2017-01-05 DIAGNOSIS — D509 Iron deficiency anemia, unspecified: Secondary | ICD-10-CM | POA: Diagnosis not present

## 2017-01-05 DIAGNOSIS — N186 End stage renal disease: Secondary | ICD-10-CM | POA: Diagnosis not present

## 2017-01-05 DIAGNOSIS — N2581 Secondary hyperparathyroidism of renal origin: Secondary | ICD-10-CM | POA: Diagnosis not present

## 2017-01-05 NOTE — Anesthesia Preprocedure Evaluation (Addendum)
Anesthesia Evaluation  Patient identified by MRN, date of birth, ID band Patient awake    Reviewed: Allergy & Precautions, NPO status , Patient's Chart, lab work & pertinent test results, reviewed documented beta blocker date and time   Airway Mallampati: II  TM Distance: >3 FB Neck ROM: Full    Dental no notable dental hx. (+) Poor Dentition   Pulmonary Current Smoker,    Pulmonary exam normal breath sounds clear to auscultation       Cardiovascular hypertension, Pt. on medications and Pt. on home beta blockers +CHF  Normal cardiovascular exam Rhythm:Regular Rate:Normal     Neuro/Psych negative neurological ROS  negative psych ROS   GI/Hepatic GERD  Medicated and Controlled,(+)     substance abuse  alcohol use and cocaine use,   Endo/Other  negative endocrine ROS  Renal/GU ESRF and DialysisRenal disease  negative genitourinary   Musculoskeletal negative musculoskeletal ROS (+)   Abdominal   Peds  Hematology  (+) anemia ,   Anesthesia Other Findings   Reproductive/Obstetrics                            Anesthesia Physical Anesthesia Plan  ASA: IV  Anesthesia Plan: MAC   Post-op Pain Management:    Induction: Intravenous  Airway Management Planned: Natural Airway and Simple Face Mask  Additional Equipment:   Intra-op Plan:   Post-operative Plan:   Informed Consent: I have reviewed the patients History and Physical, chart, labs and discussed the procedure including the risks, benefits and alternatives for the proposed anesthesia with the patient or authorized representative who has indicated his/her understanding and acceptance.     Plan Discussed with: CRNA, Anesthesiologist and Surgeon  Anesthesia Plan Comments:        Anesthesia Quick Evaluation

## 2017-01-06 ENCOUNTER — Telehealth: Payer: Self-pay | Admitting: Vascular Surgery

## 2017-01-06 ENCOUNTER — Ambulatory Visit (HOSPITAL_COMMUNITY): Payer: Medicare Other | Admitting: Anesthesiology

## 2017-01-06 ENCOUNTER — Encounter (HOSPITAL_COMMUNITY): Payer: Self-pay | Admitting: Certified Registered"

## 2017-01-06 ENCOUNTER — Encounter (HOSPITAL_COMMUNITY)
Admission: RE | Disposition: A | Discharge status: Home / Self Care | Payer: Self-pay | Source: Ambulatory Visit | Attending: Vascular Surgery

## 2017-01-06 ENCOUNTER — Ambulatory Visit (HOSPITAL_COMMUNITY)
Admission: RE | Admit: 2017-01-06 | Discharge: 2017-01-06 | Disposition: A | Discharge status: Home / Self Care | Payer: Medicare Other | Source: Ambulatory Visit | Attending: Vascular Surgery | Admitting: Vascular Surgery

## 2017-01-06 DIAGNOSIS — Z888 Allergy status to other drugs, medicaments and biological substances status: Secondary | ICD-10-CM | POA: Diagnosis not present

## 2017-01-06 DIAGNOSIS — Z8249 Family history of ischemic heart disease and other diseases of the circulatory system: Secondary | ICD-10-CM | POA: Insufficient documentation

## 2017-01-06 DIAGNOSIS — Z9114 Patient's other noncompliance with medication regimen: Secondary | ICD-10-CM | POA: Insufficient documentation

## 2017-01-06 DIAGNOSIS — I132 Hypertensive heart and chronic kidney disease with heart failure and with stage 5 chronic kidney disease, or end stage renal disease: Secondary | ICD-10-CM | POA: Diagnosis not present

## 2017-01-06 DIAGNOSIS — Z79899 Other long term (current) drug therapy: Secondary | ICD-10-CM | POA: Diagnosis not present

## 2017-01-06 DIAGNOSIS — I509 Heart failure, unspecified: Secondary | ICD-10-CM | POA: Diagnosis not present

## 2017-01-06 DIAGNOSIS — Z833 Family history of diabetes mellitus: Secondary | ICD-10-CM | POA: Insufficient documentation

## 2017-01-06 DIAGNOSIS — T82898A Other specified complication of vascular prosthetic devices, implants and grafts, initial encounter: Secondary | ICD-10-CM | POA: Diagnosis not present

## 2017-01-06 DIAGNOSIS — D649 Anemia, unspecified: Secondary | ICD-10-CM | POA: Diagnosis not present

## 2017-01-06 DIAGNOSIS — I5042 Chronic combined systolic (congestive) and diastolic (congestive) heart failure: Secondary | ICD-10-CM | POA: Diagnosis not present

## 2017-01-06 DIAGNOSIS — I728 Aneurysm of other specified arteries: Secondary | ICD-10-CM | POA: Insufficient documentation

## 2017-01-06 DIAGNOSIS — G2581 Restless legs syndrome: Secondary | ICD-10-CM | POA: Insufficient documentation

## 2017-01-06 DIAGNOSIS — X58XXXA Exposure to other specified factors, initial encounter: Secondary | ICD-10-CM | POA: Insufficient documentation

## 2017-01-06 DIAGNOSIS — N186 End stage renal disease: Secondary | ICD-10-CM | POA: Insufficient documentation

## 2017-01-06 DIAGNOSIS — K219 Gastro-esophageal reflux disease without esophagitis: Secondary | ICD-10-CM | POA: Diagnosis not present

## 2017-01-06 DIAGNOSIS — I120 Hypertensive chronic kidney disease with stage 5 chronic kidney disease or end stage renal disease: Secondary | ICD-10-CM | POA: Diagnosis not present

## 2017-01-06 DIAGNOSIS — F17210 Nicotine dependence, cigarettes, uncomplicated: Secondary | ICD-10-CM | POA: Diagnosis not present

## 2017-01-06 DIAGNOSIS — Z992 Dependence on renal dialysis: Secondary | ICD-10-CM | POA: Diagnosis not present

## 2017-01-06 DIAGNOSIS — I429 Cardiomyopathy, unspecified: Secondary | ICD-10-CM | POA: Insufficient documentation

## 2017-01-06 HISTORY — DX: Heart failure, unspecified: I50.9

## 2017-01-06 HISTORY — PX: REVISON OF ARTERIOVENOUS FISTULA: SHX6074

## 2017-01-06 LAB — POCT I-STAT 4, (NA,K, GLUC, HGB,HCT)
Glucose, Bld: 102 mg/dL — ABNORMAL HIGH (ref 65–99)
HEMATOCRIT: 42 % (ref 39.0–52.0)
Hemoglobin: 14.3 g/dL (ref 13.0–17.0)
Potassium: 4 mmol/L (ref 3.5–5.1)
SODIUM: 138 mmol/L (ref 135–145)

## 2017-01-06 SURGERY — REVISON OF ARTERIOVENOUS FISTULA
Anesthesia: Monitor Anesthesia Care | Laterality: Left

## 2017-01-06 MED ORDER — PROTAMINE SULFATE 10 MG/ML IV SOLN
INTRAVENOUS | Status: DC | PRN
  Administered 2017-01-06: 30 mg via INTRAVENOUS

## 2017-01-06 MED ORDER — CHLORHEXIDINE GLUCONATE CLOTH 2 % EX PADS: 6.0000 | MEDICATED_PAD | Freq: Once | CUTANEOUS | Status: DC

## 2017-01-06 MED ORDER — OXYCODONE-ACETAMINOPHEN 5-325 MG PO TABS: 1.0000 | ORAL_TABLET | Freq: Four times a day (QID) | ORAL | 0 refills | Status: DC | PRN

## 2017-01-06 MED ORDER — PROPOFOL 10 MG/ML IV BOLUS
INTRAVENOUS | Status: DC | PRN
  Administered 2017-01-06: 20 mg via INTRAVENOUS

## 2017-01-06 MED ORDER — SODIUM CHLORIDE 0.9 % IV SOLN
INTRAVENOUS | Status: DC | PRN
  Administered 2017-01-06: 07:00:00 via INTRAVENOUS

## 2017-01-06 MED ORDER — MIDAZOLAM HCL 2 MG/2ML IJ SOLN
INTRAMUSCULAR | Status: DC | PRN
  Administered 2017-01-06: 2 mg via INTRAVENOUS

## 2017-01-06 MED ORDER — FENTANYL CITRATE (PF) 100 MCG/2ML IJ SOLN
INTRAMUSCULAR | Status: AC
  Filled 2017-01-06: qty 2

## 2017-01-06 MED ORDER — FENTANYL CITRATE (PF) 100 MCG/2ML IJ SOLN: 25.0000 ug | INTRAMUSCULAR | Status: DC | PRN

## 2017-01-06 MED ORDER — PROPOFOL 10 MG/ML IV BOLUS
INTRAVENOUS | Status: AC
  Filled 2017-01-06: qty 20

## 2017-01-06 MED ORDER — SODIUM CHLORIDE 0.9 % IV SOLN
INTRAVENOUS | Status: DC | PRN
  Administered 2017-01-06: 08:00:00 500 mL

## 2017-01-06 MED ORDER — LIDOCAINE HCL (PF) 1 % IJ SOLN
INTRAMUSCULAR | Status: AC
  Filled 2017-01-06: qty 30

## 2017-01-06 MED ORDER — PROPOFOL 500 MG/50ML IV EMUL
INTRAVENOUS | Status: DC | PRN
  Administered 2017-01-06: 75 ug/kg/min via INTRAVENOUS

## 2017-01-06 MED ORDER — PHENYLEPHRINE HCL 10 MG/ML IJ SOLN
INTRAMUSCULAR | Status: DC | PRN
  Administered 2017-01-06 (×2): 20 ug via INTRAVENOUS
  Administered 2017-01-06: 40 ug via INTRAVENOUS
  Administered 2017-01-06 (×2): 20 ug via INTRAVENOUS
  Administered 2017-01-06: 40 ug via INTRAVENOUS
  Administered 2017-01-06: 20 ug via INTRAVENOUS

## 2017-01-06 MED ORDER — SODIUM CHLORIDE 0.9 % IV SOLN: INTRAVENOUS | Status: DC

## 2017-01-06 MED ORDER — 0.9 % SODIUM CHLORIDE (POUR BTL) OPTIME
TOPICAL | Status: DC | PRN
  Administered 2017-01-06: 1000 mL

## 2017-01-06 MED ORDER — FENTANYL CITRATE (PF) 100 MCG/2ML IJ SOLN
INTRAMUSCULAR | Status: DC | PRN
  Administered 2017-01-06: 25 ug via INTRAVENOUS
  Administered 2017-01-06 (×2): 50 ug via INTRAVENOUS
  Administered 2017-01-06: 25 ug via INTRAVENOUS
  Administered 2017-01-06: 50 ug via INTRAVENOUS

## 2017-01-06 MED ORDER — LIDOCAINE HCL (PF) 1 % IJ SOLN
INTRAMUSCULAR | Status: DC | PRN
  Administered 2017-01-06: 5 mL

## 2017-01-06 MED ORDER — MIDAZOLAM HCL 2 MG/2ML IJ SOLN
INTRAMUSCULAR | Status: AC
  Filled 2017-01-06: qty 2

## 2017-01-06 MED ORDER — DEXMEDETOMIDINE HCL 200 MCG/2ML IV SOLN
INTRAVENOUS | Status: DC | PRN
  Administered 2017-01-06 (×2): 2 ug via INTRAVENOUS
  Administered 2017-01-06: 4 ug via INTRAVENOUS
  Administered 2017-01-06: 2 ug via INTRAVENOUS

## 2017-01-06 MED ORDER — DEXTROSE 5 % IV SOLN
1.5000 g | INTRAVENOUS | Status: AC
  Administered 2017-01-06: 1.5 g via INTRAVENOUS
  Filled 2017-01-06: qty 1.5

## 2017-01-06 MED ORDER — ONDANSETRON HCL 4 MG/2ML IJ SOLN: 4.0000 mg | Freq: Once | INTRAMUSCULAR | Status: DC | PRN

## 2017-01-06 MED ORDER — HEPARIN SODIUM (PORCINE) 1000 UNIT/ML IJ SOLN
INTRAMUSCULAR | Status: DC | PRN
  Administered 2017-01-06: 7 mL via INTRAVENOUS

## 2017-01-06 SURGICAL SUPPLY — 38 items
ARMBAND PINK RESTRICT EXTREMIT (MISCELLANEOUS) ×3 IMPLANT
BNDG ESMARK 4X9 LF (GAUZE/BANDAGES/DRESSINGS) ×3 IMPLANT
CANISTER SUCTION 2500CC (MISCELLANEOUS) ×3 IMPLANT
CLIP TI MEDIUM 6 (CLIP) ×3 IMPLANT
CLIP TI WIDE RED SMALL 6 (CLIP) ×3 IMPLANT
COVER PROBE W GEL 5X96 (DRAPES) ×3 IMPLANT
CUFF TOURNIQUET SINGLE 18IN (TOURNIQUET CUFF) ×3 IMPLANT
DECANTER SPIKE VIAL GLASS SM (MISCELLANEOUS) ×3 IMPLANT
DERMABOND ADVANCED (GAUZE/BANDAGES/DRESSINGS) ×2
DERMABOND ADVANCED .7 DNX12 (GAUZE/BANDAGES/DRESSINGS) ×1 IMPLANT
DRAIN PENROSE 1/2X12 LTX STRL (WOUND CARE) IMPLANT
DRSG COVADERM 4X6 (GAUZE/BANDAGES/DRESSINGS) ×3 IMPLANT
ELECT REM PT RETURN 9FT ADLT (ELECTROSURGICAL) ×3
ELECTRODE REM PT RTRN 9FT ADLT (ELECTROSURGICAL) ×1 IMPLANT
GLOVE BIO SURGEON STRL SZ 6.5 (GLOVE) ×2 IMPLANT
GLOVE BIO SURGEON STRL SZ7 (GLOVE) ×3 IMPLANT
GLOVE BIO SURGEONS STRL SZ 6.5 (GLOVE) ×1
GLOVE BIOGEL PI IND STRL 6.5 (GLOVE) ×2 IMPLANT
GLOVE BIOGEL PI IND STRL 7.5 (GLOVE) ×1 IMPLANT
GLOVE BIOGEL PI INDICATOR 6.5 (GLOVE) ×4
GLOVE BIOGEL PI INDICATOR 7.5 (GLOVE) ×2
GOWN STRL REUS W/ TWL LRG LVL3 (GOWN DISPOSABLE) ×3 IMPLANT
GOWN STRL REUS W/TWL LRG LVL3 (GOWN DISPOSABLE) ×6
HEMOSTAT SPONGE AVITENE ULTRA (HEMOSTASIS) IMPLANT
KIT BASIN OR (CUSTOM PROCEDURE TRAY) ×3 IMPLANT
KIT ROOM TURNOVER OR (KITS) ×3 IMPLANT
NS IRRIG 1000ML POUR BTL (IV SOLUTION) ×3 IMPLANT
PACK CV ACCESS (CUSTOM PROCEDURE TRAY) ×3 IMPLANT
PAD ARMBOARD 7.5X6 YLW CONV (MISCELLANEOUS) ×6 IMPLANT
STAPLER VISISTAT 35W (STAPLE) ×3 IMPLANT
SUT MNCRL AB 4-0 PS2 18 (SUTURE) ×3 IMPLANT
SUT PROLENE 5 0 C 1 24 (SUTURE) ×9 IMPLANT
SUT PROLENE 6 0 BV (SUTURE) ×3 IMPLANT
SUT PROLENE 7 0 BV 1 (SUTURE) ×3 IMPLANT
SUT VIC AB 3-0 SH 27 (SUTURE) ×2
SUT VIC AB 3-0 SH 27X BRD (SUTURE) ×1 IMPLANT
UNDERPAD 30X30 (UNDERPADS AND DIAPERS) ×3 IMPLANT
WATER STERILE IRR 1000ML POUR (IV SOLUTION) ×3 IMPLANT

## 2017-01-06 NOTE — Telephone Encounter (Signed)
-----   Message from Mena Goes, RN sent at 01/06/2017  9:41 AM EST ----- Regarding: Suture and MD appts   ----- Message ----- From: Conrad Alpine, MD Sent: 01/06/2017   8:42 AM To: Vvs Charge Pool  [name] [name] [id number] [birth year]  PROCEDURE: Plication of left upper arm arteriovenous fistula   Asst: Silva Bandy, [facility]   Follow-up:  1. 2 weeks staple removal (nurse) 2. 4 weeks follow up with MD

## 2017-01-06 NOTE — Telephone Encounter (Signed)
spoke to pt at [facility], req letter mailed Staple removal PA on 2/9 Post Op PA on 2/23

## 2017-01-06 NOTE — Interval H&P Note (Signed)
[facility] [place]  History and Physical Update  The patient was interviewed and re-examined.  The patient's previous History and Physical has been reviewed and is unchanged from Dr. Luther Parody consult.  There is no change in the plan of care: excision of scab with revision of fistula.   Risk, benefits, and alternatives to access surgery were discussed.    The patient is aware the risks include but are not limited to: bleeding, infection, steal syndrome, nerve damage, ischemic monomelic neuropathy, thrombosis, failure to mature, need for additional procedures, death and stroke.    The patient agrees to proceed forward with the procedure.   Adele Barthel, MD [facility] [place] Office: [phone] Pager: [phone]  01/06/2017, 7:16 AM

## 2017-01-06 NOTE — Anesthesia Postprocedure Evaluation (Signed)
Anesthesia Post Note  Patient: [name] [name]  Procedure(s) Performed: Procedure(s) (LRB): REVISON OF ARTERIOVENOUS FISTULA (Left)  Patient location during evaluation: PACU Anesthesia Type: MAC Level of consciousness: awake and alert and oriented Pain management: pain level controlled Vital Signs Assessment: post-procedure vital signs reviewed and stable Respiratory status: spontaneous breathing, nonlabored ventilation and respiratory function stable Cardiovascular status: blood pressure returned to baseline and stable Postop Assessment: no signs of nausea or vomiting Anesthetic complications: no       Last Vitals:  Vitals:   01/06/17 0900 01/06/17 0915  BP: 125/88 (!) 126/91  Pulse: (!) 56 (!) 57  Resp: 12 18  Temp:      Last Pain:  Vitals:   01/06/17 0847  TempSrc:   PainSc: 0-No pain                 Lilliam Chamblee,Avaneesh A.

## 2017-01-06 NOTE — Transfer of Care (Signed)
Immediate Anesthesia Transfer of Care Note  Patient: [name] [name]  Procedure(s) Performed: Procedure(s): REVISON OF ARTERIOVENOUS FISTULA (Left)  Patient Location: PACU  Anesthesia Type:MAC  Level of Consciousness: awake, alert , oriented and patient cooperative  Airway & Oxygen Therapy: Patient Spontanous Breathing and Patient connected to face mask oxygen  Post-op Assessment: Report given to RN and Post -op Vital signs reviewed and stable  Post vital signs: Reviewed and stable  Last Vitals:  Vitals:   01/06/17 0559 01/06/17 0847  BP: (!) 141/102 (P) 117/87  Pulse: 70   Resp: 18   Temp: 36.7 C (P) 36.6 C    Last Pain:  Vitals:   01/06/17 0559  TempSrc: Oral      Patients Stated Pain Goal: 3 (00/37/04 8889)  Complications: No apparent anesthesia complications

## 2017-01-06 NOTE — Op Note (Signed)
    OPERATIVE NOTE   PROCEDURE: 1. Plication of left upper arm arteriovenous fistula   PRE-OPERATIVE DIAGNOSIS: Pseudoaneurysm degeneration of arteriovenous fistula   POST-OPERATIVE DIAGNOSIS: same as above   SURGEON: Adele Barthel, MD  ASSISTANT(S): Silva Bandy, PAC   ANESTHESIA: local and MAC  ESTIMATED BLOOD LOSS: 50 cc  FINDING(S): 1. Moderate size pseudoaneurysm with overlying ulcer 2. Palpable thrill at end of the case  SPECIMEN(S):  none  INDICATIONS:   [name] [name] is a 51 y.o. 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  In order to salvage the fistula and decrease the bleeding complication risks, I recommended plication of the fistula.  Risk, benefits, and alternatives to access surgery were discussed.  The patient is aware the risks include but are not limited to: bleeding, infection, steal syndrome, nerve damage, ischemic monomelic neuropathy, failure to mature, need for additional procedures, thrombosis of the access, death and stroke.  The patient agrees to proceed forward with the procedure.   DESCRIPTION: After obtaining full informed written consent, the patient was brought back to the operating room and placed supine upon the operating table.  The patient received IV antibiotics prior to induction.  After obtaining adequate anesthesia, the patient was prepped and draped in the standard fashion for: left arm access procedure.  The patient was given 7000 units of Heparin intravenously, which was a therapeutic bolus.  After waiting 5 minutes, a sterile tourniquet was applied to the upper arm and inflated to 250 mm Hg after exsanguinating the arm with an Esmark bandage.    An elliptical incision was made around the skin overlying the pseudoaneurysmal segment in the fistula.  I dissected the skin away from the pseudoaneurysm with electrocautery.  Eventually, I was able to skeletonize the pseudoaneurysm.  I sharply entered into  the pseudoaneurysm to visualize the lumen of the pseudoaneurysm.  The residual fistula wall was felt to be adequately strong to repair.  I sharply tailored the wall of this fistula.  I repaired the fistula with a running stitch of 5-0 Prolene, taking care to take large bites of the fistula wall.  Prior to completing this repair, I passed 5 mm dilator proximally and distally: no resistance was encountered.  The repair was completed in the usual fashion.    At this point, the patient was given 30 mg of Protamine intravenously to reverse the anticoagulation.  Bleeding points were controlled with electrocautery.  No further active bleeding was noted.  I reapproximated the subcutaneous tissue immediately above the fistula with a running stitch of 3-0 Vicryl.  The skin was reapproximated with staples.  The skin was cleaned, dried, and dressed with Coverderm.  The patient continued to have a palpable thrill in the fistula.   COMPLICATIONS: none  CONDITION: stable   Adele Barthel, MD, [facility] [place] Office: [phone] Pager: [phone]  01/06/2017, 8:39 AM

## 2017-01-06 NOTE — Progress Notes (Signed)
Arrived to pacu with iv infusing from pacu

## 2017-01-06 NOTE — H&P (View-Only) (Signed)
[facility]  Patient name: [name] [name] MRN: [id number] DOB: 1965-12-24 Sex: male  REASON FOR VISIT: scab left arm fistula  HPI: [name] [name] is a 51 y.o. male, who presents with 2-3 month history of a scab overlying his left upper arm fistula. The patient previously underwent left basilic vein transposition by Dr. Bridgett Larsson in 2014. He has been dialyzing on MWF without complications. He denies any bleeding issues. The scab did improve some last month with rest of the fistula, but started progressing again when he was stuck in that area.   He has a past medical history of CHF, hypertension and polysubstance abuse. He is not on blood thinners. He is a smoker. He denies any chest pain or shortness of breath.   Past Medical History:  Diagnosis Date  . Combined congestive systolic and diastolic heart failure ([place]) 04/18/2014   A. Echo 8/13: Severe LVH, EF 40-45%, inferoposterior akinesis, grade 2 diastolic dysfunction, moderate LAE, mild RVE, mildly reduced RVSF, mild RAE; cannot rule out R atrial mass-suggest TEE or cardiac MRI  //  B. Echo 3/17: Mild LVH, EF 25-30%, diffuse HK, grade 2 diastolic dysfunction, mild MR, severe LAE,  moderately reduced RVSF, severe RAE, mild TR, moderate PI, PASP 55 mmHg    . ESRD (end stage renal disease) ([place])   . Hemodialysis patient ([place])    M,W,F  . Hypertension   . Medical non-compliance   . Non-ischemic cardiomyopathy ([place])    A. R/L HC 3/17: Normal coronary arteries, moderate pulmonary hypertension (PASP 65 mmHg), elevated LV filling pressures (LVEDP 45 mmHg)   . NSVT (nonsustained ventricular tachycardia) ([place]) 02/22/2016  . Polysubstance abuse   . Restless leg syndrome     Family History  Problem Relation Age of Onset  . Hypertension Mother   . Diabetes Mother   . Hypertension Father     SOCIAL HISTORY: Social History   Social History  . Marital status: Single    Spouse name: N/A  . Number of children: N/A  .  Years of education: N/A   Occupational History  . Not on file.   Social History Main Topics  . Smoking status: Current Every Day Smoker    Packs/day: 0.25    Years: 10.00    Types: Cigarettes    Last attempt to quit: 08/09/2012  . Smokeless tobacco: Current User     Comment: Trying to quit; 3 cigs/last 2 weeks  . Alcohol use 4.2 oz/week    7 Cans of beer per week     Comment: Patient not forthcoming.  2-3 beers per week  . Drug use: No     Comment: Marijuana  . Sexual activity: Not Currently   Other Topics Concern  . Not on file   Social History Narrative  . No narrative on file    Allergies  Allergen Reactions  . Losartan Cough    Current Outpatient Prescriptions  Medication Sig Dispense Refill  . calcium acetate (PHOSLO) 667 MG capsule Take 3 capsules (2,001 mg total) by mouth 3 (three) times daily with meals. 90 capsule 0  . carvedilol (COREG) 25 MG tablet Take 25 mg by mouth 2 (two) times daily.    . clonazePAM (KLONOPIN) 0.5 MG tablet Take 0.5 mg by mouth daily as needed for anxiety.    . isosorbide mononitrate (IMDUR) 60 MG 24 hr tablet Take 60 mg by mouth daily.    . calcitRIOL (ROCALTROL) 0.5 MCG capsule Take 1 capsule (0.5 mcg  total) by mouth every Monday, Wednesday, and Friday with hemodialysis. (Patient not taking: Reported on 12/30/2016)    . losartan (COZAAR) 25 MG tablet Take 1 tablet (25 mg total) by mouth daily. (Patient not taking: Reported on 08/01/2016) 90 tablet 3  . oxyCODONE (OXY IR/ROXICODONE) 5 MG immediate release tablet Take 1-2 tablets (5-10 mg total) by mouth every 4 (four) hours as needed for moderate pain. (Patient not taking: Reported on 12/30/2016) 40 tablet 0   No current facility-administered medications for this visit.     REVIEW OF SYSTEMS:  [X]  denotes positive finding, [ ]  denotes negative finding Cardiac  Comments:  Chest pain or chest pressure:    Shortness of breath upon exertion:    Short of breath when lying flat:    Irregular  heart rhythm:        Vascular    Pain in calf, thigh, or hip brought on by ambulation:    Pain in feet at night that wakes you up from your sleep:     Blood clot in your veins:    Leg swelling:         Pulmonary    Oxygen at home:    Productive cough:     Wheezing:         Neurologic    Sudden weakness in arms or legs:     Sudden numbness in arms or legs:     Sudden onset of difficulty speaking or slurred speech:    Temporary loss of vision in one eye:     Problems with dizziness:         Gastrointestinal    Blood in stool:     Vomited blood:         Genitourinary    Burning when urinating:     Blood in urine:        Psychiatric    Major depression:         Hematologic    Bleeding problems:    Problems with blood clotting too easily:        Skin    Rashes or ulcers: x Scab to left upper arm fistula 2-3 months      Constitutional    Fever or chills:      PHYSICAL EXAM: Vitals:   12/30/16 1258 12/30/16 1300  BP: (!) 146/106 (!) 151/106  Pulse: 74   Resp: 18   Temp: 97.4 F (36.3 C)   TempSrc: Oral   SpO2: 97%   Weight: 172 lb 12.8 oz (78.4 kg)   Height: 5\' 9"  (1.753 m)     GENERAL: The patient is a well-nourished male, in no acute distress. The vital signs are documented above. CARDIAC: There is a regular rate and rhythm. No carotid bruits.  VASCULAR: Left upper arm fistula with some skin thinning and scab to mid portion of fistula. Minimally aneurysmal. The scab cannot be mobilized over the fistula. There is a larger aneurysmal segment just distal to the scab that is without any skin thinning or ulceration. Easily palpable thrill left upper arm fistula. 1+ left radial pulse.  PULMONARY: There is good air exchange bilaterally without wheezing or rales. MUSCULOSKELETAL: There are no major deformities or cyanosis. NEUROLOGIC: No focal weakness or paresthesias are detected. SKIN: See vascular section above. No other rashes appreciated.  PSYCHIATRIC: The  patient has a normal affect.  MEDICAL ISSUES: Ulceration left arm fistula ESRD on HD (MWF)  There is some skin thinning and a scab overlying the mid  portion of the patient's left upper arm fistula. He fortunately has not had any bleeding issues. Discussed that he is at increased risk of bleeding secondary to this. Dr. Donnetta Hutching also saw the patient. Recommend revision of his left arm fistula. He has adequate room on his fistula for cannulation while his surgical site heals. Plan for revision of left arm fistula on 01/06/17 with Dr. Patterson Hammersmith, PA-C [facility] [place] MD: [place]

## 2017-01-07 ENCOUNTER — Encounter (HOSPITAL_COMMUNITY): Payer: Self-pay | Admitting: Vascular Surgery

## 2017-01-07 DIAGNOSIS — N186 End stage renal disease: Secondary | ICD-10-CM | POA: Diagnosis not present

## 2017-01-07 DIAGNOSIS — D509 Iron deficiency anemia, unspecified: Secondary | ICD-10-CM | POA: Diagnosis not present

## 2017-01-07 DIAGNOSIS — N2581 Secondary hyperparathyroidism of renal origin: Secondary | ICD-10-CM | POA: Diagnosis not present

## 2017-01-09 DIAGNOSIS — N2581 Secondary hyperparathyroidism of renal origin: Secondary | ICD-10-CM | POA: Diagnosis not present

## 2017-01-09 DIAGNOSIS — N186 End stage renal disease: Secondary | ICD-10-CM | POA: Diagnosis not present

## 2017-01-09 DIAGNOSIS — D509 Iron deficiency anemia, unspecified: Secondary | ICD-10-CM | POA: Diagnosis not present

## 2017-01-12 DIAGNOSIS — D509 Iron deficiency anemia, unspecified: Secondary | ICD-10-CM | POA: Diagnosis not present

## 2017-01-12 DIAGNOSIS — N186 End stage renal disease: Secondary | ICD-10-CM | POA: Diagnosis not present

## 2017-01-12 DIAGNOSIS — N2581 Secondary hyperparathyroidism of renal origin: Secondary | ICD-10-CM | POA: Diagnosis not present

## 2017-01-14 DIAGNOSIS — N186 End stage renal disease: Secondary | ICD-10-CM | POA: Diagnosis not present

## 2017-01-14 DIAGNOSIS — Z992 Dependence on renal dialysis: Secondary | ICD-10-CM | POA: Diagnosis not present

## 2017-01-14 DIAGNOSIS — I120 Hypertensive chronic kidney disease with stage 5 chronic kidney disease or end stage renal disease: Secondary | ICD-10-CM | POA: Diagnosis not present

## 2017-01-14 DIAGNOSIS — D509 Iron deficiency anemia, unspecified: Secondary | ICD-10-CM | POA: Diagnosis not present

## 2017-01-14 DIAGNOSIS — N2581 Secondary hyperparathyroidism of renal origin: Secondary | ICD-10-CM | POA: Diagnosis not present

## 2017-01-16 ENCOUNTER — Encounter: Payer: Self-pay | Admitting: Vascular Surgery

## 2017-01-16 DIAGNOSIS — N186 End stage renal disease: Secondary | ICD-10-CM | POA: Diagnosis not present

## 2017-01-16 DIAGNOSIS — N2581 Secondary hyperparathyroidism of renal origin: Secondary | ICD-10-CM | POA: Diagnosis not present

## 2017-01-16 DIAGNOSIS — D509 Iron deficiency anemia, unspecified: Secondary | ICD-10-CM | POA: Diagnosis not present

## 2017-01-19 DIAGNOSIS — N2581 Secondary hyperparathyroidism of renal origin: Secondary | ICD-10-CM | POA: Diagnosis not present

## 2017-01-19 DIAGNOSIS — D509 Iron deficiency anemia, unspecified: Secondary | ICD-10-CM | POA: Diagnosis not present

## 2017-01-19 DIAGNOSIS — N186 End stage renal disease: Secondary | ICD-10-CM | POA: Diagnosis not present

## 2017-01-21 DIAGNOSIS — N186 End stage renal disease: Secondary | ICD-10-CM | POA: Diagnosis not present

## 2017-01-21 DIAGNOSIS — D509 Iron deficiency anemia, unspecified: Secondary | ICD-10-CM | POA: Diagnosis not present

## 2017-01-21 DIAGNOSIS — N2581 Secondary hyperparathyroidism of renal origin: Secondary | ICD-10-CM | POA: Diagnosis not present

## 2017-01-23 ENCOUNTER — Ambulatory Visit (INDEPENDENT_AMBULATORY_CARE_PROVIDER_SITE_OTHER): Payer: Medicare Other | Admitting: Physician Assistant

## 2017-01-23 VITALS — BP 128/88 | HR 71 | Temp 97.8°F | Resp 16 | Ht 69.0 in | Wt 165.0 lb

## 2017-01-23 DIAGNOSIS — N186 End stage renal disease: Secondary | ICD-10-CM

## 2017-01-23 DIAGNOSIS — N2581 Secondary hyperparathyroidism of renal origin: Secondary | ICD-10-CM | POA: Diagnosis not present

## 2017-01-23 DIAGNOSIS — D509 Iron deficiency anemia, unspecified: Secondary | ICD-10-CM | POA: Diagnosis not present

## 2017-01-23 DIAGNOSIS — Z992 Dependence on renal dialysis: Secondary | ICD-10-CM

## 2017-01-23 NOTE — Progress Notes (Signed)
  POST OPERATIVE OFFICE NOTE    CC:  F/u for surgery  HPI:  This is a 51 y.o. male who is s/p who is s/p plication of left upper arm AV fistula by Dr. Bridgett Larsson on 01/06/17.  He has done well since then.  They are able to use his fistula without difficulty.  He denies any pain in his hand.  He dialyzes M/W/F on [employer].    Allergies  Allergen Reactions  . Losartan Cough    Current Outpatient Prescriptions  Medication Sig Dispense Refill  . calcium acetate (PHOSLO) 667 MG capsule Take 3 capsules (2,001 mg total) by mouth 3 (three) times daily with meals. 90 capsule 0  . carvedilol (COREG) 25 MG tablet Take 25 mg by mouth 2 (two) times daily.    . clonazePAM (KLONOPIN) 0.5 MG tablet Take 0.5 mg by mouth daily as needed for anxiety.    . isosorbide mononitrate (IMDUR) 60 MG 24 hr tablet Take 60 mg by mouth daily.     No current facility-administered medications for this visit.      ROS:  See HPI  Physical Exam:  Vitals:   01/23/17 1252  BP: 128/88  Pulse: 71  Resp: 16  Temp: 97.8 F (36.6 C)    Incision:  Healing nicely with staples in tact.  Extremities:  Strong left hand grip with 2+ left radial pulse.  There is an excellent thrill within the fistula. Small superficial area on the aneurysmal area distal to the incision.  No eschar present.   Assessment/Plan:  This is a 51 y.o. male who is s/p: Plication of left upper arm AV fistula  -pt is doing well and incision is well healed. -will remove staples today -he does have a small superficial area on the aneurysmal area distal to the incision.  He will f/u with Dr. Bridgett Larsson in 2 weeks on 02/06/17 at 11am to evaluate this area.  This should heal with period of rest.  I have instructed the pt not to stick over the incision or the area of concern until he sees Dr. Bridgett Larsson back in 2 weeks.  May stick the distal portion of the incision. -he will call sooner if he has any issues.     Leontine Locket, PA-C [facility] [phone]  Clinic MD:  Donzetta Matters

## 2017-01-26 DIAGNOSIS — N2581 Secondary hyperparathyroidism of renal origin: Secondary | ICD-10-CM | POA: Diagnosis not present

## 2017-01-26 DIAGNOSIS — N186 End stage renal disease: Secondary | ICD-10-CM | POA: Diagnosis not present

## 2017-01-26 DIAGNOSIS — D509 Iron deficiency anemia, unspecified: Secondary | ICD-10-CM | POA: Diagnosis not present

## 2017-01-28 DIAGNOSIS — D509 Iron deficiency anemia, unspecified: Secondary | ICD-10-CM | POA: Diagnosis not present

## 2017-01-28 DIAGNOSIS — N186 End stage renal disease: Secondary | ICD-10-CM | POA: Diagnosis not present

## 2017-01-28 DIAGNOSIS — N2581 Secondary hyperparathyroidism of renal origin: Secondary | ICD-10-CM | POA: Diagnosis not present

## 2017-02-02 DIAGNOSIS — N186 End stage renal disease: Secondary | ICD-10-CM | POA: Diagnosis not present

## 2017-02-02 DIAGNOSIS — N2581 Secondary hyperparathyroidism of renal origin: Secondary | ICD-10-CM | POA: Diagnosis not present

## 2017-02-02 DIAGNOSIS — D509 Iron deficiency anemia, unspecified: Secondary | ICD-10-CM | POA: Diagnosis not present

## 2017-02-03 ENCOUNTER — Encounter: Payer: Self-pay | Admitting: Vascular Surgery

## 2017-02-03 NOTE — Progress Notes (Deleted)
    Postoperative Access Visit   History of Present Illness  Marico Buckle is a 51 y.o. year old male who presents for postoperative follow-up for: plication of LUA AVF (Date: 01/06/17).  The patient's wounds are *** healed.  The patient notes *** steal symptoms.  The patient is *** able to complete their activities of daily living.  The patient's current symptoms are: ***.  For VQI Use Only  PRE-ADM LIVING: {VQI Pre-admission Living:20973}  AMB STATUS: {VQI Ambulatory Status:20974}  Physical Examination There were no vitals filed for this visit.  LUE: Incision is *** healed, skin feels ***, hand grip is ***/5, sensation in digits is *** intact, ***palpable thrill, bruit can *** be auscultated   Medical Decision Making  Danis Pembleton is a 51 y.o. year old male who presents s/p plication of LUA AVF.   The patient's access is *** ready for use.  The patient's tunneled dialysis catheter can be removed after two successful cannulations and completed dialysis treatments.  Thank you for allowing [place] to participate in this patient's care.   Adele Barthel, MD, FACS [facility] [place] Office: [phone] Pager: [phone]

## 2017-02-04 DIAGNOSIS — D509 Iron deficiency anemia, unspecified: Secondary | ICD-10-CM | POA: Diagnosis not present

## 2017-02-04 DIAGNOSIS — N2581 Secondary hyperparathyroidism of renal origin: Secondary | ICD-10-CM | POA: Diagnosis not present

## 2017-02-04 DIAGNOSIS — N186 End stage renal disease: Secondary | ICD-10-CM | POA: Diagnosis not present

## 2017-02-06 ENCOUNTER — Encounter: Payer: Medicare Other | Admitting: Vascular Surgery

## 2017-02-06 DIAGNOSIS — D509 Iron deficiency anemia, unspecified: Secondary | ICD-10-CM | POA: Diagnosis not present

## 2017-02-06 DIAGNOSIS — N186 End stage renal disease: Secondary | ICD-10-CM | POA: Diagnosis not present

## 2017-02-06 DIAGNOSIS — N2581 Secondary hyperparathyroidism of renal origin: Secondary | ICD-10-CM | POA: Diagnosis not present

## 2017-02-09 DIAGNOSIS — D509 Iron deficiency anemia, unspecified: Secondary | ICD-10-CM | POA: Diagnosis not present

## 2017-02-09 DIAGNOSIS — N2581 Secondary hyperparathyroidism of renal origin: Secondary | ICD-10-CM | POA: Diagnosis not present

## 2017-02-09 DIAGNOSIS — N186 End stage renal disease: Secondary | ICD-10-CM | POA: Diagnosis not present

## 2017-02-11 DIAGNOSIS — D509 Iron deficiency anemia, unspecified: Secondary | ICD-10-CM | POA: Diagnosis not present

## 2017-02-11 DIAGNOSIS — I120 Hypertensive chronic kidney disease with stage 5 chronic kidney disease or end stage renal disease: Secondary | ICD-10-CM | POA: Diagnosis not present

## 2017-02-11 DIAGNOSIS — N2581 Secondary hyperparathyroidism of renal origin: Secondary | ICD-10-CM | POA: Diagnosis not present

## 2017-02-11 DIAGNOSIS — Z992 Dependence on renal dialysis: Secondary | ICD-10-CM | POA: Diagnosis not present

## 2017-02-11 DIAGNOSIS — N186 End stage renal disease: Secondary | ICD-10-CM | POA: Diagnosis not present

## 2017-02-13 ENCOUNTER — Encounter: Payer: Self-pay | Admitting: Vascular Surgery

## 2017-02-16 DIAGNOSIS — N186 End stage renal disease: Secondary | ICD-10-CM | POA: Diagnosis not present

## 2017-02-16 DIAGNOSIS — D509 Iron deficiency anemia, unspecified: Secondary | ICD-10-CM | POA: Diagnosis not present

## 2017-02-16 DIAGNOSIS — N2581 Secondary hyperparathyroidism of renal origin: Secondary | ICD-10-CM | POA: Diagnosis not present

## 2017-02-18 DIAGNOSIS — D509 Iron deficiency anemia, unspecified: Secondary | ICD-10-CM | POA: Diagnosis not present

## 2017-02-18 DIAGNOSIS — N2581 Secondary hyperparathyroidism of renal origin: Secondary | ICD-10-CM | POA: Diagnosis not present

## 2017-02-18 DIAGNOSIS — N186 End stage renal disease: Secondary | ICD-10-CM | POA: Diagnosis not present

## 2017-02-19 NOTE — Progress Notes (Deleted)
    Postoperative Access Visit   History of Present Illness  [name] [name] is a 51 y.o. year old male who presents for postoperative follow-up for: pliation of L UA AVF (Date: 01/06/17).  The patient's wounds are *** healed.  The patient notes *** steal symptoms.  The patient is *** able to complete their activities of daily living.  The patient's current symptoms are: ***.  For VQI Use Only  PRE-ADM LIVING: {VQI Pre-admission Living:20973}  AMB STATUS: {VQI Ambulatory Status:20974}   Physical Examination There were no vitals filed for this visit.  LUE: Incision is *** healed, skin feels ***, hand grip is ***/5, sensation in digits is *** intact, ***palpable thrill, bruit can *** be auscultated    Medical Decision Making  [name] [name] is a 51 y.o. year old male who presents s/p L UA AVF plication.   The patient's access is *** ready for use.  Thank you for allowing [place] to participate in this patient's care.  Adele Barthel, MD, FACS [facility] [place] Office: [phone] Pager: [phone]

## 2017-02-20 ENCOUNTER — Encounter: Payer: Medicare Other | Admitting: Vascular Surgery

## 2017-02-20 DIAGNOSIS — D509 Iron deficiency anemia, unspecified: Secondary | ICD-10-CM | POA: Diagnosis not present

## 2017-02-20 DIAGNOSIS — N186 End stage renal disease: Secondary | ICD-10-CM | POA: Diagnosis not present

## 2017-02-20 DIAGNOSIS — N2581 Secondary hyperparathyroidism of renal origin: Secondary | ICD-10-CM | POA: Diagnosis not present

## 2017-02-23 DIAGNOSIS — N186 End stage renal disease: Secondary | ICD-10-CM | POA: Diagnosis not present

## 2017-02-23 DIAGNOSIS — D509 Iron deficiency anemia, unspecified: Secondary | ICD-10-CM | POA: Diagnosis not present

## 2017-02-23 DIAGNOSIS — N2581 Secondary hyperparathyroidism of renal origin: Secondary | ICD-10-CM | POA: Diagnosis not present

## 2017-02-24 NOTE — Progress Notes (Deleted)
    Postoperative Access Visit   History of Present Illness  [name] [name] is a 51 y.o. year old male who presents for postoperative follow-up for: plicationof LUA AVF (Date: 01/06/17).  The patient's wounds are *** healed.  The patient notes *** steal symptoms.  The patient is *** able to complete their activities of daily living.  The patient's current symptoms are: ***.  For VQI Use Only  PRE-ADM LIVING: {VQI Pre-admission Living:20973}  AMB STATUS: {VQI Ambulatory Status:20974}  Physical Examination There were no vitals filed for this visit.  LUE: Incision is *** healed, skin feels ***, hand grip is ***/5, sensation in digits is *** intact, ***palpable thrill, bruit can *** be auscultated   Medical Decision Making  [name] [name] is a 51 y.o. year old male who presents s/p Plication of LUA AVF.   The patient's access is *** ready for use.  The patient's tunneled dialysis catheter can be removed after two successful cannulations and completed dialysis treatments.  Thank you for allowing [place] to participate in this patient's care.  Adele Barthel, MD, FACS [facility] [place] Office: [phone] Pager: [phone]

## 2017-02-25 DIAGNOSIS — N186 End stage renal disease: Secondary | ICD-10-CM | POA: Diagnosis not present

## 2017-02-25 DIAGNOSIS — N2581 Secondary hyperparathyroidism of renal origin: Secondary | ICD-10-CM | POA: Diagnosis not present

## 2017-02-25 DIAGNOSIS — D509 Iron deficiency anemia, unspecified: Secondary | ICD-10-CM | POA: Diagnosis not present

## 2017-02-27 ENCOUNTER — Encounter: Payer: Medicare Other | Admitting: Vascular Surgery

## 2017-02-27 DIAGNOSIS — N2581 Secondary hyperparathyroidism of renal origin: Secondary | ICD-10-CM | POA: Diagnosis not present

## 2017-02-27 DIAGNOSIS — D509 Iron deficiency anemia, unspecified: Secondary | ICD-10-CM | POA: Diagnosis not present

## 2017-02-27 DIAGNOSIS — N186 End stage renal disease: Secondary | ICD-10-CM | POA: Diagnosis not present

## 2017-03-02 DIAGNOSIS — D509 Iron deficiency anemia, unspecified: Secondary | ICD-10-CM | POA: Diagnosis not present

## 2017-03-02 DIAGNOSIS — N2581 Secondary hyperparathyroidism of renal origin: Secondary | ICD-10-CM | POA: Diagnosis not present

## 2017-03-02 DIAGNOSIS — N186 End stage renal disease: Secondary | ICD-10-CM | POA: Diagnosis not present

## 2017-03-04 DIAGNOSIS — N2581 Secondary hyperparathyroidism of renal origin: Secondary | ICD-10-CM | POA: Diagnosis not present

## 2017-03-04 DIAGNOSIS — N186 End stage renal disease: Secondary | ICD-10-CM | POA: Diagnosis not present

## 2017-03-04 DIAGNOSIS — D509 Iron deficiency anemia, unspecified: Secondary | ICD-10-CM | POA: Diagnosis not present

## 2017-03-06 DIAGNOSIS — N186 End stage renal disease: Secondary | ICD-10-CM | POA: Diagnosis not present

## 2017-03-06 DIAGNOSIS — D509 Iron deficiency anemia, unspecified: Secondary | ICD-10-CM | POA: Diagnosis not present

## 2017-03-06 DIAGNOSIS — N2581 Secondary hyperparathyroidism of renal origin: Secondary | ICD-10-CM | POA: Diagnosis not present

## 2017-03-09 DIAGNOSIS — D509 Iron deficiency anemia, unspecified: Secondary | ICD-10-CM | POA: Diagnosis not present

## 2017-03-09 DIAGNOSIS — N2581 Secondary hyperparathyroidism of renal origin: Secondary | ICD-10-CM | POA: Diagnosis not present

## 2017-03-09 DIAGNOSIS — N186 End stage renal disease: Secondary | ICD-10-CM | POA: Diagnosis not present

## 2017-03-11 DIAGNOSIS — N2581 Secondary hyperparathyroidism of renal origin: Secondary | ICD-10-CM | POA: Diagnosis not present

## 2017-03-11 DIAGNOSIS — D509 Iron deficiency anemia, unspecified: Secondary | ICD-10-CM | POA: Diagnosis not present

## 2017-03-11 DIAGNOSIS — N186 End stage renal disease: Secondary | ICD-10-CM | POA: Diagnosis not present

## 2017-03-13 DIAGNOSIS — D509 Iron deficiency anemia, unspecified: Secondary | ICD-10-CM | POA: Diagnosis not present

## 2017-03-13 DIAGNOSIS — N2581 Secondary hyperparathyroidism of renal origin: Secondary | ICD-10-CM | POA: Diagnosis not present

## 2017-03-13 DIAGNOSIS — N186 End stage renal disease: Secondary | ICD-10-CM | POA: Diagnosis not present

## 2017-03-14 DIAGNOSIS — N186 End stage renal disease: Secondary | ICD-10-CM | POA: Diagnosis not present

## 2017-03-14 DIAGNOSIS — Z992 Dependence on renal dialysis: Secondary | ICD-10-CM | POA: Diagnosis not present

## 2017-03-14 DIAGNOSIS — I120 Hypertensive chronic kidney disease with stage 5 chronic kidney disease or end stage renal disease: Secondary | ICD-10-CM | POA: Diagnosis not present

## 2017-03-16 DIAGNOSIS — Z23 Encounter for immunization: Secondary | ICD-10-CM | POA: Diagnosis not present

## 2017-03-16 DIAGNOSIS — N186 End stage renal disease: Secondary | ICD-10-CM | POA: Diagnosis not present

## 2017-03-16 DIAGNOSIS — D509 Iron deficiency anemia, unspecified: Secondary | ICD-10-CM | POA: Diagnosis not present

## 2017-03-16 DIAGNOSIS — N2581 Secondary hyperparathyroidism of renal origin: Secondary | ICD-10-CM | POA: Diagnosis not present

## 2017-03-18 DIAGNOSIS — N2581 Secondary hyperparathyroidism of renal origin: Secondary | ICD-10-CM | POA: Diagnosis not present

## 2017-03-18 DIAGNOSIS — D509 Iron deficiency anemia, unspecified: Secondary | ICD-10-CM | POA: Diagnosis not present

## 2017-03-18 DIAGNOSIS — Z23 Encounter for immunization: Secondary | ICD-10-CM | POA: Diagnosis not present

## 2017-03-18 DIAGNOSIS — N186 End stage renal disease: Secondary | ICD-10-CM | POA: Diagnosis not present

## 2017-03-23 DIAGNOSIS — Z23 Encounter for immunization: Secondary | ICD-10-CM | POA: Diagnosis not present

## 2017-03-23 DIAGNOSIS — N2581 Secondary hyperparathyroidism of renal origin: Secondary | ICD-10-CM | POA: Diagnosis not present

## 2017-03-23 DIAGNOSIS — N186 End stage renal disease: Secondary | ICD-10-CM | POA: Diagnosis not present

## 2017-03-23 DIAGNOSIS — D509 Iron deficiency anemia, unspecified: Secondary | ICD-10-CM | POA: Diagnosis not present

## 2017-03-25 DIAGNOSIS — D509 Iron deficiency anemia, unspecified: Secondary | ICD-10-CM | POA: Diagnosis not present

## 2017-03-25 DIAGNOSIS — N2581 Secondary hyperparathyroidism of renal origin: Secondary | ICD-10-CM | POA: Diagnosis not present

## 2017-03-25 DIAGNOSIS — Z23 Encounter for immunization: Secondary | ICD-10-CM | POA: Diagnosis not present

## 2017-03-25 DIAGNOSIS — N186 End stage renal disease: Secondary | ICD-10-CM | POA: Diagnosis not present

## 2017-03-27 DIAGNOSIS — N2581 Secondary hyperparathyroidism of renal origin: Secondary | ICD-10-CM | POA: Diagnosis not present

## 2017-03-27 DIAGNOSIS — N186 End stage renal disease: Secondary | ICD-10-CM | POA: Diagnosis not present

## 2017-03-27 DIAGNOSIS — D509 Iron deficiency anemia, unspecified: Secondary | ICD-10-CM | POA: Diagnosis not present

## 2017-03-27 DIAGNOSIS — Z23 Encounter for immunization: Secondary | ICD-10-CM | POA: Diagnosis not present

## 2017-03-30 DIAGNOSIS — N186 End stage renal disease: Secondary | ICD-10-CM | POA: Diagnosis not present

## 2017-03-30 DIAGNOSIS — N2581 Secondary hyperparathyroidism of renal origin: Secondary | ICD-10-CM | POA: Diagnosis not present

## 2017-03-30 DIAGNOSIS — D509 Iron deficiency anemia, unspecified: Secondary | ICD-10-CM | POA: Diagnosis not present

## 2017-03-30 DIAGNOSIS — Z23 Encounter for immunization: Secondary | ICD-10-CM | POA: Diagnosis not present

## 2017-04-01 DIAGNOSIS — D509 Iron deficiency anemia, unspecified: Secondary | ICD-10-CM | POA: Diagnosis not present

## 2017-04-01 DIAGNOSIS — N2581 Secondary hyperparathyroidism of renal origin: Secondary | ICD-10-CM | POA: Diagnosis not present

## 2017-04-01 DIAGNOSIS — N186 End stage renal disease: Secondary | ICD-10-CM | POA: Diagnosis not present

## 2017-04-01 DIAGNOSIS — Z23 Encounter for immunization: Secondary | ICD-10-CM | POA: Diagnosis not present

## 2017-04-03 DIAGNOSIS — N186 End stage renal disease: Secondary | ICD-10-CM | POA: Diagnosis not present

## 2017-04-03 DIAGNOSIS — N2581 Secondary hyperparathyroidism of renal origin: Secondary | ICD-10-CM | POA: Diagnosis not present

## 2017-04-03 DIAGNOSIS — D509 Iron deficiency anemia, unspecified: Secondary | ICD-10-CM | POA: Diagnosis not present

## 2017-04-03 DIAGNOSIS — Z23 Encounter for immunization: Secondary | ICD-10-CM | POA: Diagnosis not present

## 2017-04-06 DIAGNOSIS — N2581 Secondary hyperparathyroidism of renal origin: Secondary | ICD-10-CM | POA: Diagnosis not present

## 2017-04-06 DIAGNOSIS — Z23 Encounter for immunization: Secondary | ICD-10-CM | POA: Diagnosis not present

## 2017-04-06 DIAGNOSIS — D509 Iron deficiency anemia, unspecified: Secondary | ICD-10-CM | POA: Diagnosis not present

## 2017-04-06 DIAGNOSIS — N186 End stage renal disease: Secondary | ICD-10-CM | POA: Diagnosis not present

## 2017-04-08 DIAGNOSIS — Z23 Encounter for immunization: Secondary | ICD-10-CM | POA: Diagnosis not present

## 2017-04-08 DIAGNOSIS — N2581 Secondary hyperparathyroidism of renal origin: Secondary | ICD-10-CM | POA: Diagnosis not present

## 2017-04-08 DIAGNOSIS — D509 Iron deficiency anemia, unspecified: Secondary | ICD-10-CM | POA: Diagnosis not present

## 2017-04-08 DIAGNOSIS — N186 End stage renal disease: Secondary | ICD-10-CM | POA: Diagnosis not present

## 2017-04-10 DIAGNOSIS — D509 Iron deficiency anemia, unspecified: Secondary | ICD-10-CM | POA: Diagnosis not present

## 2017-04-10 DIAGNOSIS — Z23 Encounter for immunization: Secondary | ICD-10-CM | POA: Diagnosis not present

## 2017-04-10 DIAGNOSIS — N186 End stage renal disease: Secondary | ICD-10-CM | POA: Diagnosis not present

## 2017-04-10 DIAGNOSIS — N2581 Secondary hyperparathyroidism of renal origin: Secondary | ICD-10-CM | POA: Diagnosis not present

## 2017-04-13 DIAGNOSIS — Z992 Dependence on renal dialysis: Secondary | ICD-10-CM | POA: Diagnosis not present

## 2017-04-13 DIAGNOSIS — Z23 Encounter for immunization: Secondary | ICD-10-CM | POA: Diagnosis not present

## 2017-04-13 DIAGNOSIS — N2581 Secondary hyperparathyroidism of renal origin: Secondary | ICD-10-CM | POA: Diagnosis not present

## 2017-04-13 DIAGNOSIS — N186 End stage renal disease: Secondary | ICD-10-CM | POA: Diagnosis not present

## 2017-04-13 DIAGNOSIS — D509 Iron deficiency anemia, unspecified: Secondary | ICD-10-CM | POA: Diagnosis not present

## 2017-04-13 DIAGNOSIS — I120 Hypertensive chronic kidney disease with stage 5 chronic kidney disease or end stage renal disease: Secondary | ICD-10-CM | POA: Diagnosis not present

## 2017-04-15 DIAGNOSIS — N2581 Secondary hyperparathyroidism of renal origin: Secondary | ICD-10-CM | POA: Diagnosis not present

## 2017-04-15 DIAGNOSIS — N186 End stage renal disease: Secondary | ICD-10-CM | POA: Diagnosis not present

## 2017-04-15 DIAGNOSIS — D509 Iron deficiency anemia, unspecified: Secondary | ICD-10-CM | POA: Diagnosis not present

## 2017-04-17 DIAGNOSIS — N2581 Secondary hyperparathyroidism of renal origin: Secondary | ICD-10-CM | POA: Diagnosis not present

## 2017-04-17 DIAGNOSIS — D509 Iron deficiency anemia, unspecified: Secondary | ICD-10-CM | POA: Diagnosis not present

## 2017-04-17 DIAGNOSIS — N186 End stage renal disease: Secondary | ICD-10-CM | POA: Diagnosis not present

## 2017-04-20 DIAGNOSIS — N186 End stage renal disease: Secondary | ICD-10-CM | POA: Diagnosis not present

## 2017-04-20 DIAGNOSIS — D509 Iron deficiency anemia, unspecified: Secondary | ICD-10-CM | POA: Diagnosis not present

## 2017-04-20 DIAGNOSIS — N2581 Secondary hyperparathyroidism of renal origin: Secondary | ICD-10-CM | POA: Diagnosis not present

## 2017-04-22 DIAGNOSIS — N2581 Secondary hyperparathyroidism of renal origin: Secondary | ICD-10-CM | POA: Diagnosis not present

## 2017-04-22 DIAGNOSIS — N186 End stage renal disease: Secondary | ICD-10-CM | POA: Diagnosis not present

## 2017-04-22 DIAGNOSIS — D509 Iron deficiency anemia, unspecified: Secondary | ICD-10-CM | POA: Diagnosis not present

## 2017-04-24 DIAGNOSIS — N2581 Secondary hyperparathyroidism of renal origin: Secondary | ICD-10-CM | POA: Diagnosis not present

## 2017-04-24 DIAGNOSIS — N186 End stage renal disease: Secondary | ICD-10-CM | POA: Diagnosis not present

## 2017-04-24 DIAGNOSIS — D509 Iron deficiency anemia, unspecified: Secondary | ICD-10-CM | POA: Diagnosis not present

## 2017-04-27 ENCOUNTER — Encounter: Payer: Self-pay | Admitting: Vascular Surgery

## 2017-04-29 ENCOUNTER — Ambulatory Visit: Payer: Medicare Other | Admitting: Vascular Surgery

## 2017-04-29 DIAGNOSIS — N2581 Secondary hyperparathyroidism of renal origin: Secondary | ICD-10-CM | POA: Diagnosis not present

## 2017-04-29 DIAGNOSIS — D509 Iron deficiency anemia, unspecified: Secondary | ICD-10-CM | POA: Diagnosis not present

## 2017-04-29 DIAGNOSIS — N186 End stage renal disease: Secondary | ICD-10-CM | POA: Diagnosis not present

## 2017-05-01 DIAGNOSIS — N2581 Secondary hyperparathyroidism of renal origin: Secondary | ICD-10-CM | POA: Diagnosis not present

## 2017-05-01 DIAGNOSIS — D509 Iron deficiency anemia, unspecified: Secondary | ICD-10-CM | POA: Diagnosis not present

## 2017-05-01 DIAGNOSIS — N186 End stage renal disease: Secondary | ICD-10-CM | POA: Diagnosis not present

## 2017-05-04 DIAGNOSIS — N186 End stage renal disease: Secondary | ICD-10-CM | POA: Diagnosis not present

## 2017-05-04 DIAGNOSIS — D509 Iron deficiency anemia, unspecified: Secondary | ICD-10-CM | POA: Diagnosis not present

## 2017-05-04 DIAGNOSIS — N2581 Secondary hyperparathyroidism of renal origin: Secondary | ICD-10-CM | POA: Diagnosis not present

## 2017-05-08 DIAGNOSIS — N186 End stage renal disease: Secondary | ICD-10-CM | POA: Diagnosis not present

## 2017-05-08 DIAGNOSIS — N2581 Secondary hyperparathyroidism of renal origin: Secondary | ICD-10-CM | POA: Diagnosis not present

## 2017-05-08 DIAGNOSIS — D509 Iron deficiency anemia, unspecified: Secondary | ICD-10-CM | POA: Diagnosis not present

## 2017-05-11 DIAGNOSIS — N2581 Secondary hyperparathyroidism of renal origin: Secondary | ICD-10-CM | POA: Diagnosis not present

## 2017-05-11 DIAGNOSIS — N186 End stage renal disease: Secondary | ICD-10-CM | POA: Diagnosis not present

## 2017-05-11 DIAGNOSIS — D509 Iron deficiency anemia, unspecified: Secondary | ICD-10-CM | POA: Diagnosis not present

## 2017-05-13 DIAGNOSIS — N186 End stage renal disease: Secondary | ICD-10-CM | POA: Diagnosis not present

## 2017-05-13 DIAGNOSIS — D509 Iron deficiency anemia, unspecified: Secondary | ICD-10-CM | POA: Diagnosis not present

## 2017-05-13 DIAGNOSIS — N2581 Secondary hyperparathyroidism of renal origin: Secondary | ICD-10-CM | POA: Diagnosis not present

## 2017-05-14 DIAGNOSIS — N186 End stage renal disease: Secondary | ICD-10-CM | POA: Diagnosis not present

## 2017-05-14 DIAGNOSIS — I120 Hypertensive chronic kidney disease with stage 5 chronic kidney disease or end stage renal disease: Secondary | ICD-10-CM | POA: Diagnosis not present

## 2017-05-14 DIAGNOSIS — Z992 Dependence on renal dialysis: Secondary | ICD-10-CM | POA: Diagnosis not present

## 2017-05-15 DIAGNOSIS — N186 End stage renal disease: Secondary | ICD-10-CM | POA: Diagnosis not present

## 2017-05-15 DIAGNOSIS — D509 Iron deficiency anemia, unspecified: Secondary | ICD-10-CM | POA: Diagnosis not present

## 2017-05-15 DIAGNOSIS — N2581 Secondary hyperparathyroidism of renal origin: Secondary | ICD-10-CM | POA: Diagnosis not present

## 2017-05-18 ENCOUNTER — Inpatient Hospital Stay (HOSPITAL_COMMUNITY)
Admission: EM | Admit: 2017-05-18 | Discharge: 2017-05-20 | DRG: 291 | Disposition: A | Discharge status: Home / Self Care | Payer: Medicare Other | Attending: Internal Medicine | Admitting: Internal Medicine

## 2017-05-18 ENCOUNTER — Observation Stay (HOSPITAL_COMMUNITY): Payer: Medicare Other

## 2017-05-18 ENCOUNTER — Emergency Department (HOSPITAL_COMMUNITY): Payer: Medicare Other

## 2017-05-18 ENCOUNTER — Encounter (HOSPITAL_COMMUNITY): Payer: Self-pay | Admitting: Vascular Surgery

## 2017-05-18 DIAGNOSIS — R0609 Other forms of dyspnea: Secondary | ICD-10-CM | POA: Diagnosis not present

## 2017-05-18 DIAGNOSIS — R0602 Shortness of breath: Secondary | ICD-10-CM | POA: Diagnosis not present

## 2017-05-18 DIAGNOSIS — K5730 Diverticulosis of large intestine without perforation or abscess without bleeding: Secondary | ICD-10-CM | POA: Diagnosis not present

## 2017-05-18 DIAGNOSIS — I4581 Long QT syndrome: Secondary | ICD-10-CM | POA: Diagnosis not present

## 2017-05-18 DIAGNOSIS — R109 Unspecified abdominal pain: Secondary | ICD-10-CM

## 2017-05-18 DIAGNOSIS — Z992 Dependence on renal dialysis: Secondary | ICD-10-CM

## 2017-05-18 DIAGNOSIS — R9431 Abnormal electrocardiogram [ECG] [EKG]: Secondary | ICD-10-CM

## 2017-05-18 DIAGNOSIS — K8020 Calculus of gallbladder without cholecystitis without obstruction: Secondary | ICD-10-CM | POA: Diagnosis present

## 2017-05-18 DIAGNOSIS — R1011 Right upper quadrant pain: Secondary | ICD-10-CM | POA: Diagnosis not present

## 2017-05-18 DIAGNOSIS — N2581 Secondary hyperparathyroidism of renal origin: Secondary | ICD-10-CM | POA: Diagnosis present

## 2017-05-18 DIAGNOSIS — R748 Abnormal levels of other serum enzymes: Secondary | ICD-10-CM | POA: Diagnosis not present

## 2017-05-18 DIAGNOSIS — I428 Other cardiomyopathies: Secondary | ICD-10-CM

## 2017-05-18 DIAGNOSIS — I2729 Other secondary pulmonary hypertension: Secondary | ICD-10-CM | POA: Diagnosis present

## 2017-05-18 DIAGNOSIS — F1410 Cocaine abuse, uncomplicated: Secondary | ICD-10-CM | POA: Diagnosis present

## 2017-05-18 DIAGNOSIS — D631 Anemia in chronic kidney disease: Secondary | ICD-10-CM | POA: Diagnosis present

## 2017-05-18 DIAGNOSIS — R778 Other specified abnormalities of plasma proteins: Secondary | ICD-10-CM | POA: Diagnosis present

## 2017-05-18 DIAGNOSIS — I429 Cardiomyopathy, unspecified: Secondary | ICD-10-CM | POA: Diagnosis present

## 2017-05-18 DIAGNOSIS — I5043 Acute on chronic combined systolic (congestive) and diastolic (congestive) heart failure: Secondary | ICD-10-CM | POA: Diagnosis not present

## 2017-05-18 DIAGNOSIS — N186 End stage renal disease: Secondary | ICD-10-CM

## 2017-05-18 DIAGNOSIS — I5042 Chronic combined systolic (congestive) and diastolic (congestive) heart failure: Secondary | ICD-10-CM | POA: Diagnosis present

## 2017-05-18 DIAGNOSIS — R945 Abnormal results of liver function studies: Secondary | ICD-10-CM | POA: Diagnosis present

## 2017-05-18 DIAGNOSIS — F17210 Nicotine dependence, cigarettes, uncomplicated: Secondary | ICD-10-CM | POA: Diagnosis present

## 2017-05-18 DIAGNOSIS — T447X6A Underdosing of beta-adrenoreceptor antagonists, initial encounter: Secondary | ICD-10-CM | POA: Diagnosis present

## 2017-05-18 DIAGNOSIS — R7989 Other specified abnormal findings of blood chemistry: Secondary | ICD-10-CM

## 2017-05-18 DIAGNOSIS — R079 Chest pain, unspecified: Secondary | ICD-10-CM

## 2017-05-18 DIAGNOSIS — I132 Hypertensive heart and chronic kidney disease with heart failure and with stage 5 chronic kidney disease, or end stage renal disease: Secondary | ICD-10-CM | POA: Diagnosis not present

## 2017-05-18 DIAGNOSIS — Z833 Family history of diabetes mellitus: Secondary | ICD-10-CM

## 2017-05-18 DIAGNOSIS — Z91128 Patient's intentional underdosing of medication regimen for other reason: Secondary | ICD-10-CM

## 2017-05-18 DIAGNOSIS — F1010 Alcohol abuse, uncomplicated: Secondary | ICD-10-CM | POA: Diagnosis present

## 2017-05-18 DIAGNOSIS — Z8249 Family history of ischemic heart disease and other diseases of the circulatory system: Secondary | ICD-10-CM

## 2017-05-18 DIAGNOSIS — D509 Iron deficiency anemia, unspecified: Secondary | ICD-10-CM | POA: Diagnosis not present

## 2017-05-18 HISTORY — DX: Abnormal electrocardiogram (ECG) (EKG): R94.31

## 2017-05-18 HISTORY — DX: Dependence on renal dialysis: Z99.2

## 2017-05-18 HISTORY — DX: End stage renal disease: N18.6

## 2017-05-18 LAB — HEPATIC FUNCTION PANEL
ALBUMIN: 3.4 g/dL — AB (ref 3.5–5.0)
ALT: 120 U/L — AB (ref 17–63)
AST: 69 U/L — ABNORMAL HIGH (ref 15–41)
Alkaline Phosphatase: 68 U/L (ref 38–126)
Bilirubin, Direct: 0.1 mg/dL (ref 0.1–0.5)
Indirect Bilirubin: 0.8 mg/dL (ref 0.3–0.9)
TOTAL PROTEIN: 6.7 g/dL (ref 6.5–8.1)
Total Bilirubin: 0.9 mg/dL (ref 0.3–1.2)

## 2017-05-18 LAB — BASIC METABOLIC PANEL
Anion gap: 15 (ref 5–15)
BUN: 30 mg/dL — AB (ref 6–20)
CHLORIDE: 93 mmol/L — AB (ref 101–111)
CO2: 31 mmol/L (ref 22–32)
Calcium: 7.8 mg/dL — ABNORMAL LOW (ref 8.9–10.3)
Creatinine, Ser: 8.17 mg/dL — ABNORMAL HIGH (ref 0.61–1.24)
GFR calc Af Amer: 8 mL/min — ABNORMAL LOW (ref >60)
GFR calc non Af Amer: 7 mL/min — ABNORMAL LOW (ref >60)
GLUCOSE: 138 mg/dL — AB (ref 65–99)
Potassium: 3.5 mmol/L (ref 3.5–5.1)
Sodium: 139 mmol/L (ref 135–145)

## 2017-05-18 LAB — CBC
HEMATOCRIT: 36.7 % — AB (ref 39.0–52.0)
Hemoglobin: 12.2 g/dL — ABNORMAL LOW (ref 13.0–17.0)
MCH: 30.7 pg (ref 26.0–34.0)
MCHC: 33.2 g/dL (ref 30.0–36.0)
MCV: 92.4 fL (ref 78.0–100.0)
Platelets: 162 10*3/uL (ref 150–400)
RBC: 3.97 MIL/uL — ABNORMAL LOW (ref 4.22–5.81)
RDW: 15.4 % (ref 11.5–15.5)
WBC: 4.5 10*3/uL (ref 4.0–10.5)

## 2017-05-18 LAB — TROPONIN I: Troponin I: 0.29 ng/mL (ref <0.03)

## 2017-05-18 LAB — I-STAT TROPONIN, ED: Troponin i, poc: 0.24 ng/mL (ref 0.00–0.08)

## 2017-05-18 LAB — LACTIC ACID, PLASMA: Lactic Acid, Venous: 2.4 mmol/L (ref 0.5–1.9)

## 2017-05-18 LAB — MAGNESIUM: Magnesium: 2.8 mg/dL — ABNORMAL HIGH (ref 1.7–2.4)

## 2017-05-18 LAB — LIPASE, BLOOD: Lipase: 24 U/L (ref 11–51)

## 2017-05-18 MED ORDER — GUAIFENESIN ER 600 MG PO TB12
600.0000 mg | ORAL_TABLET | Freq: Two times a day (BID) | ORAL | Status: DC
  Administered 2017-05-18 – 2017-05-20 (×4): 600 mg via ORAL
  Filled 2017-05-18 (×4): qty 1

## 2017-05-18 MED ORDER — MAGNESIUM SULFATE 2 GM/50ML IV SOLN
2.0000 g | INTRAVENOUS | Status: AC
  Administered 2017-05-18: 2 g via INTRAVENOUS
  Filled 2017-05-18: qty 50

## 2017-05-18 MED ORDER — CARVEDILOL 25 MG PO TABS
25.0000 mg | ORAL_TABLET | Freq: Two times a day (BID) | ORAL | Status: DC
  Administered 2017-05-19 (×2): 25 mg via ORAL
  Filled 2017-05-18 (×3): qty 1

## 2017-05-18 MED ORDER — SODIUM CHLORIDE 0.9 % IV SOLN: 250.0000 mL | INTRAVENOUS | Status: DC | PRN

## 2017-05-18 MED ORDER — SODIUM CHLORIDE 0.9% FLUSH
3.0000 mL | Freq: Two times a day (BID) | INTRAVENOUS | Status: DC
  Administered 2017-05-18 – 2017-05-20 (×4): 3 mL via INTRAVENOUS

## 2017-05-18 MED ORDER — CALCIUM ACETATE (PHOS BINDER) 667 MG PO CAPS
2001.0000 mg | ORAL_CAPSULE | Freq: Three times a day (TID) | ORAL | Status: DC
  Administered 2017-05-19 – 2017-05-20 (×2): 2001 mg via ORAL
  Filled 2017-05-18 (×2): qty 3

## 2017-05-18 MED ORDER — CALCIUM ACETATE (PHOS BINDER) 667 MG PO CAPS: 1334.0000 mg | ORAL_CAPSULE | ORAL | Status: DC | PRN

## 2017-05-18 MED ORDER — ENOXAPARIN SODIUM 30 MG/0.3ML ~~LOC~~ SOLN
30.0000 mg | Freq: Every day | SUBCUTANEOUS | Status: DC
  Administered 2017-05-18 – 2017-05-19 (×2): 30 mg via SUBCUTANEOUS
  Filled 2017-05-18 (×2): qty 0.3

## 2017-05-18 MED ORDER — ALBUTEROL SULFATE (2.5 MG/3ML) 0.083% IN NEBU: 2.5000 mg | INHALATION_SOLUTION | RESPIRATORY_TRACT | Status: DC | PRN

## 2017-05-18 MED ORDER — HYDROCODONE-ACETAMINOPHEN 5-325 MG PO TABS
1.0000 | ORAL_TABLET | ORAL | Status: DC | PRN
  Administered 2017-05-18: 1 via ORAL
  Filled 2017-05-18: qty 1

## 2017-05-18 MED ORDER — ISOSORBIDE MONONITRATE ER 60 MG PO TB24
60.0000 mg | ORAL_TABLET | Freq: Every day | ORAL | Status: DC
Start: 2017-05-19 — End: 2017-05-20
  Administered 2017-05-19 – 2017-05-20 (×2): 60 mg via ORAL
  Filled 2017-05-18 (×2): qty 1

## 2017-05-18 MED ORDER — SODIUM CHLORIDE 0.9% FLUSH: 3.0000 mL | INTRAVENOUS | Status: DC | PRN

## 2017-05-18 MED ORDER — CALCIUM ACETATE 667 MG PO CAPS: 1334.0000 mg | ORAL_CAPSULE | ORAL | Status: DC

## 2017-05-18 MED ORDER — ACETAMINOPHEN 650 MG RE SUPP
650.0000 mg | Freq: Four times a day (QID) | RECTAL | Status: DC | PRN
Start: 2017-05-18 — End: 2017-05-20

## 2017-05-18 MED ORDER — ACETAMINOPHEN 325 MG PO TABS: 650.0000 mg | ORAL_TABLET | Freq: Four times a day (QID) | ORAL | Status: DC | PRN

## 2017-05-18 MED ORDER — IOPAMIDOL (ISOVUE-370) INJECTION 76%
INTRAVENOUS | Status: AC
  Administered 2017-05-18: 100 mL via INTRAVENOUS
  Filled 2017-05-18: qty 100

## 2017-05-18 MED ORDER — SODIUM CHLORIDE 0.9% FLUSH
3.0000 mL | Freq: Two times a day (BID) | INTRAVENOUS | Status: DC
  Administered 2017-05-20: 3 mL via INTRAVENOUS

## 2017-05-18 NOTE — ED Notes (Signed)
Troponin 0.24

## 2017-05-18 NOTE — ED Triage Notes (Signed)
Pt reports to the ED for eval of SOB with minimal exertion. Denies any CP. States that he has had it x several days. He reports a dry cough x several days as well. Denies any BLE swelling. Has hx of CHF in the past and he is a dialysis patient. Last dialysis was this am. He did have his full tx. He states dialysis did not help his symptoms.

## 2017-05-18 NOTE — ED Notes (Signed)
Crit result Lactic acid 2.4

## 2017-05-18 NOTE — H&P (Signed)
[name] MMN:[id number] DOB: 06-Feb-1966 DOA: 05/18/2017     PCP: System, [place] Not In   Outpatient [facility], Cardiology Caryl Comes Patient coming from:   home Lives  With family    Chief Complaint: Shortness of breath  HPI: [name] [name] is a 51 y.o. male with medical history significant of       HTN, ESRD on HD, polysubstance abuse including cocaine and ETOH, combined systolic and diastolic CHF, secondary hyperparathyroidism, anemia, VT in the setting of cocaine and HTN urgency in 2011, medical non-adherence.  Presented with 4 days dyspnea and exertional shortness of breath with some orthopnea. Endorses RUQ pain and tenderness. He been feeling un well. No nausea.  symptoms has progressively been getting worse he denies any leg edema feels that he always been dialyzed down to her dry weight. He has occasional nonproductive cough but has been getting better. He continues to smoke about 2 cigarettes a day. Reports no cocaine for the past 90 days Last hemodialysis was this a.m.  reports no fever no chest pain.  NO recent sick contacts, no travel. Still able to make urine.   Reports decreased appetite, no diarrhea. No melena no blood in stool  Regarding pertinent Chronic problems:  EF was 40-45% in 2013. in 2017 EF 90-38%, mod diastolic dysfunction, PASP 55 mmHg. He was noted to have NSVT on Tele in the hospital. Cardiac catheterization demonstrated normal coronary arteries. ESRD on HD He goes every Monday, Wednesday, Friday [place] through left upper extremity fistula,  IN ER:  Temp (24hrs), Avg:97.8 F (36.6 C), Min:97.8 F (36.6 C), Max:97.8 F (36.6 C) Last vitals  RR 15 setting 100% HR 65 BP 114/95    on arrival  ED Triage Vitals  Enc Vitals Group     BP 05/18/17 1610 (!) 131/100     Pulse Rate 05/18/17 1610 76     Resp 05/18/17 1610 20     Temp 05/18/17 1610 97.8 F (36.6 C)     Temp Source 05/18/17 1610 Oral     SpO2 05/18/17 1610 99 %     Weight  05/18/17 1615 169 lb 3.2 oz (76.7 kg)     Height 05/18/17 1615 5\' 9"  (1.753 m)     Head Circumference --      Peak Flow --      Pain Score 05/18/17 1615 6     Pain Loc --      Pain Edu? --      Excl. in [place]? --     Troponin 0.24 at  his baseline  Sodium 139 potassium 3.5 BUN 30 creatinine 8.17 WBC 4.4 hemoglobin 12.2 CXR showing cardiomegaly but no edema or infiltrates  Following Medications were ordered in ER: Medications  magnesium sulfate IVPB 2 g 50 mL (0 g Intravenous Stopped 05/18/17 2014)    Hospitalist was called for admission for Dyspnea and prolonged QT  Review of Systems:    Pertinent positives include:  shortness of breath at rest  dyspnea on exertion, orthopnea Constitutional:  No weight loss, night sweats, Fevers, chills, fatigue, weight loss  HEENT:  No headaches, Difficulty swallowing,Tooth/dental problems,Sore throat,  No sneezing, itching, ear ache, nasal congestion, post nasal drip,  Cardio-vascular:  No chest pain, Orthopnea, PND, anasarca, dizziness, palpitations.no Bilateral lower extremity swelling  GI:  No heartburn, indigestion, abdominal pain, nausea, vomiting, diarrhea, change in bowel habits, loss of appetite, melena, blood in stool, hematemesis Resp:  no . No No excess mucus, no productive cough,  No non-productive cough, No coughing up of blood.No change in color of mucus.No wheezing. Skin:  no rash or lesions. No jaundice GU:  no dysuria, change in color of urine, no urgency or frequency. No straining to urinate.  No flank pain.  Musculoskeletal:  No joint pain or no joint swelling. No decreased range of motion. No back pain.  Psych:  No change in mood or affect. No depression or anxiety. No memory loss.  Neuro: no localizing neurological complaints, no tingling, no weakness, no double vision, no gait abnormality, no slurred speech, no confusion  As per HPI otherwise 10 point review of systems negative.   Past Medical History: Past  Medical History:  Diagnosis Date  . CHF (congestive heart failure) ([place])   . Combined congestive systolic and diastolic heart failure ([place]) 04/18/2014   A. Echo 8/13: Severe LVH, EF 40-45%, inferoposterior akinesis, grade 2 diastolic dysfunction, moderate LAE, mild RVE, mildly reduced RVSF, mild RAE; cannot rule out R atrial mass-suggest TEE or cardiac MRI  //  B. Echo 3/17: Mild LVH, EF 25-30%, diffuse HK, grade 2 diastolic dysfunction, mild MR, severe LAE,  moderately reduced RVSF, severe RAE, mild TR, moderate PI, PASP 55 mmHg    . ESRD (end stage renal disease) ([place])    MWF- [facility]  . Hemodialysis patient ([place])    M,W,F  . Hypertension   . Medical non-compliance   . Non-ischemic cardiomyopathy ([place])    A. R/L HC 3/17: Normal coronary arteries, moderate pulmonary hypertension (PASP 65 mmHg), elevated LV filling pressures (LVEDP 45 mmHg)   . NSVT (nonsustained ventricular tachycardia) ([place]) 02/22/2016  . Polysubstance abuse   . Restless leg syndrome    Past Surgical History:  Procedure Laterality Date  . [place] TRANSPOSITION  01/19/2013   Procedure: BASCILIC VEIN TRANSPOSITION;  Surgeon: Conrad Albion, MD;  Location: [facility];  Service: Vascular;  Laterality: Left;  left 2nd stage basilic vein transposition  . CARDIAC CATHETERIZATION N/A 02/25/2016   Procedure: Right/Left Heart Cath and Coronary Angiography;  Surgeon: Burnell Blanks, MD;  Location: [facility] CV LAB;  Service: Cardiovascular;  Laterality: N/A;  . INGUINAL HERNIA REPAIR Right 08/05/2016   Procedure: RIGHT INGUINAL HERNIA REPAIR WITH MESH;  Surgeon: Donnie Mesa, MD;  Location: [facility];  Service: General;  Laterality: Right;  . INSERTION OF DIALYSIS CATHETER  07/17/2012   Procedure: INSERTION OF DIALYSIS CATHETER;  Surgeon: Conrad Woodland, MD;  Location: [facility];  Service: Vascular;  Laterality: Right;  right internal jugular  . INSERTION OF MESH Right 08/05/2016   Procedure: INSERTION OF MESH;  Surgeon: Donnie Mesa, MD;  Location: [place];  Service: General;  Laterality: Right;  . REVISON OF ARTERIOVENOUS FISTULA Left 01/06/2017   Procedure: REVISON OF ARTERIOVENOUS FISTULA;  Surgeon: Conrad [place], MD;  Location: [facility];  Service: Vascular;  Laterality: Left;  . UMBILICAL HERNIA REPAIR N/A 08/05/2016   Procedure: [place];  Surgeon: Donnie Mesa, MD;  Location: [facility];  Service: General;  Laterality: N/A;  . VENOGRAM N/A 08/09/2012   Procedure: VENOGRAM;  Surgeon: Conrad Sterling, MD;  Location: [facility] CATH LAB;  Service: Cardiovascular;  Laterality: N/A;     Social History:  Ambulatory  independently     reports that he has been smoking Cigarettes.  He has a 2.50 pack-year smoking history. He uses smokeless tobacco. He reports that he drinks alcohol. He reports that he does not use drugs.  Allergies:  Allergies  Allergen Reactions  . Losartan Cough       Family History:   Family History  Problem Relation Age of Onset  . Hypertension Mother   . Diabetes Mother   . Hypertension Father     Medications: Prior to Admission medications   Medication Sig Start Date End Date Taking? Authorizing Provider  acetaminophen (TYLENOL) 325 MG tablet Take 650 mg by mouth every 6 (six) hours as needed for headache (pain).   Yes [provider]  calcium acetate (PHOSLO) 667 MG capsule Take 3 capsules (2,001 mg total) by mouth 3 (three) times daily with meals. Patient taking differently: Take 1,334-3,335 mg by mouth See admin instructions. Take 3-5 capsules (2001 - 3335 mg) by mouth 3 times daily with large meals, take 2 capsules (1334 mg) with snacks 04/21/14  Yes Mikhail, Maryann, DO  carvedilol (COREG) 25 MG tablet Take 25 mg by mouth 2 (two) times daily. 07/10/16  Yes [provider]  isosorbide mononitrate (IMDUR) 60 MG 24 hr tablet Take 60 mg by mouth daily. 07/21/12  Yes Tat, Shanon Brow, MD  Multiple Vitamin (MULTIVITAMIN WITH MINERALS) TABS tablet Take 1 tablet by mouth  daily.   Yes [provider]    Physical Exam: Patient Vitals for the past 24 hrs:  BP Temp Temp src Pulse Resp SpO2 Height Weight  05/18/17 1900 (!) 114/95 - - 65 15 100 % - -  05/18/17 1830 113/89 - - 62 12 98 % - -  05/18/17 1806 (!) 105/94 - - 66 13 100 % - -  05/18/17 1803 - - - - - 100 % - -  05/18/17 1615 - - - - - - 5\' 9"  (1.753 m) 76.7 kg (169 lb 3.2 oz)  05/18/17 1610 (!) 131/100 97.8 F (36.6 C) Oral 76 20 99 % - -    1. General:  in No Acute distress 2. Psychological: Alert and   Oriented 3. Head/ENT:   Moist Mucous Membranes                          Head Non traumatic, neck supple                            Poor Dentition 4. SKIN:  decreased Skin turgor,  Skin clean Dry and intact no rash 5. Heart: Regular rate and rhythm no Murmur, Rub or gallop 6. Lungs:  no wheezes mild  crackles  At the bases 7. Abdomen: Soft, diffusely tender, Non distended 8. Lower extremities: no clubbing, cyanosis, or edema 9. Neurologically Grossly intact, moving all 4 extremities equally  intact 10. MSK: Normal range of motion   body mass index is 24.99 kg/m.  Labs on Admission:   Labs on Admission: I have personally reviewed following labs and imaging studies  CBC:  Recent Labs Lab 05/18/17 1630  WBC 4.5  HGB 12.2*  HCT 36.7*  MCV 92.4  PLT 938   Basic Metabolic Panel:  Recent Labs Lab 05/18/17 1630  NA 139  K 3.5  CL 93*  CO2 31  GLUCOSE 138*  BUN 30*  CREATININE 8.17*  CALCIUM 7.8*   GFR: Estimated Creatinine Clearance: 10.9 mL/min (A) (by C-G formula based on SCr of 8.17 mg/dL (H)). Liver Function Tests: No results for input(s): AST, ALT, ALKPHOS, BILITOT, PROT, ALBUMIN in the last 168 hours. No results for input(s): LIPASE, AMYLASE in the last 168 hours.  No results for input(s): AMMONIA in the last 168 hours. Coagulation Profile: No results for input(s): INR, PROTIME in the last 168 hours. Cardiac Enzymes: No results for input(s): CKTOTAL,  CKMB, CKMBINDEX, TROPONINI in the last 168 hours. BNP (last 3 results) No results for input(s): PROBNP in the last 8760 hours. HbA1C: No results for input(s): HGBA1C in the last 72 hours. CBG: No results for input(s): GLUCAP in the last 168 hours. Lipid Profile: No results for input(s): CHOL, HDL, LDLCALC, TRIG, CHOLHDL, LDLDIRECT in the last 72 hours. Thyroid Function Tests: No results for input(s): TSH, T4TOTAL, FREET4, T3FREE, THYROIDAB in the last 72 hours. Anemia Panel: No results for input(s): VITAMINB12, FOLATE, FERRITIN, TIBC, IRON, RETICCTPCT in the last 72 hours.  Sepsis Labs: @LABRCNTIP (procalcitonin:4,lacticidven:4) )No results found for this or any previous visit (from the past 240 hour(s)).     Lab Results  Component Value Date   HGBA1C (H) 03/10/2011    5.7 (NOTE)                                                                       According to the ADA Clinical Practice Recommendations for 2011, when HbA1c is used as a screening test:   >=6.5%   Diagnostic of Diabetes Mellitus           (if abnormal result  is confirmed)  5.7-6.4%   Increased risk of developing Diabetes Mellitus  References:Diagnosis and Classification of Diabetes Mellitus,Diabetes LYYT,0354,65(KCLEX 1):S62-S69 and Standards of Medical Care in         Diabetes - 2011,Diabetes Care,2011,34  (Suppl 1):S11-S61.    Estimated Creatinine Clearance: 10.9 mL/min (A) (by C-G formula based on SCr of 8.17 mg/dL (H)).  BNP (last 3 results) No results for input(s): PROBNP in the last 8760 hours.   ECG REPORT  Independently reviewed Rate:73  Rhythm: Sinus rhythm with left posterior fascicular block No evidence of ischemia QTC 656  Filed Weights   05/18/17 1615  Weight: 76.7 kg (169 lb 3.2 oz)     Cultures:    Component Value Date/Time   SDES BLOOD RIGHT HAND 02/20/2016 2328   SPECREQUEST IN PEDIATRIC BOTTLE 2CC 02/20/2016 2328   CULT NO GROWTH 5 DAYS 02/20/2016 2328   REPTSTATUS 02/26/2016 FINAL  02/20/2016 2328     Radiological Exams on Admission: Dg Chest 2 View  Result Date: 05/18/2017 CLINICAL DATA:  Shortness of breath EXAM: CHEST  2 VIEW COMPARISON:  02/20/2016 FINDINGS: Mild cardiomegaly. No edema or pleural effusion. No pneumothorax. Probable old right eighth and ninth rib fractures. IMPRESSION: Mild cardiomegaly.  No edema or infiltrate. Electronically Signed   By: Donavan Foil M.D.   On: 05/18/2017 17:37    Chart has been reviewed    Assessment/Plan  51 y.o. male with medical history significant of       HTN, ESRD on HD, polysubstance abuse including cocaine and ETOH, combined systolic and diastolic CHF, secondary hyperparathyroidism, anemia, VT in the setting of cocaine and HTN urgency in 2011, medical non-adherence. Admitted for dyspnea and prolonged QT  Present on Admission: . Elevated troponin chronic in the setting of end-stage renal disease and cardiomyopathy . Prolonged QT interval monitor on telemetry hold QT prolonging medications, check electrolytes and replace as needed  repeat in the morning . Alcohol abuse - will monitor for sign of withdrawal, may explain elevated LFT's and CT finding of chirrosis. CIWA protocol . Cocaine abuse - currently deneis . Combined congestive systolic and diastolic heart failure ([place]) - repeat echo, appreciate cardiology and nephrology input.    . RUQ pain - CT evidence of cirrhosis and cholelithiasis without cholecystitis will obtain right upper quadrant ultrasound to value gallbladder better. Alcohol   abuse may be contributing to liver disease     Other plan as per orders.  DVT prophylaxis:    Lovenox     Code Status:  FULL CODE as per patient    Family Communication:   Family not  at  Bedside    Disposition Plan:    To home once workup is complete and patient is stable                             Consults called: email cardiology , left msg to nephrology  Admission status:  obs   Level of care    tele          I have spent a total of 67 min on this admission   Alvilda Mckenna 05/19/2017, 12:27 AM  [facility]  Pager [phone]   after 2 AM please page floor coverage PA If 7AM-7PM, please contact the day team taking care of the patient  [email or web address]  Password [email or web address]

## 2017-05-18 NOTE — ED Provider Notes (Signed)
[facility] DEPT Provider Note   CSN: [id number] Arrival date & time: 05/18/17  1553     History   Chief Complaint Chief Complaint  Patient presents with  . Shortness of Breath    HPI [name] [name] is a 50 y.o. male.  HPI  The patient is a 51 year old male, he has a known history of end-stage renal disease on dialysis, he uses his left upper extremity fistula, he goes on Monday Wednesdays and Fridays and has not missed any dialysis. He states that he has been close to his dry weight, he dialyzed this morning. He has had exertional shortness of breath over the last several days, he also has some orthopnea over the last several days. He has been told he has some congestive heart failure but never had a myocardial infarction. Symptoms are gradually worsening, becoming more severe, not associated with lower extremity swelling fevers or chills. He does have a nonproductive cough. He still smokes one to 2 cigarettes a day. He has not used illegal drugs including cocaine over 90 days.   Past Medical History:  Diagnosis Date  . CHF (congestive heart failure) ([place])   . Combined congestive systolic and diastolic heart failure ([place]) 04/18/2014   A. Echo 8/13: Severe LVH, EF 40-45%, inferoposterior akinesis, grade 2 diastolic dysfunction, moderate LAE, mild RVE, mildly reduced RVSF, mild RAE; cannot rule out R atrial mass-suggest TEE or cardiac MRI  //  B. Echo 3/17: Mild LVH, EF 25-30%, diffuse HK, grade 2 diastolic dysfunction, mild MR, severe LAE,  moderately reduced RVSF, severe RAE, mild TR, moderate PI, PASP 55 mmHg    . ESRD (end stage renal disease) ([place])    MWF- [facility]  . Hemodialysis patient ([place])    M,W,F  . Hypertension   . Medical non-compliance   . Non-ischemic cardiomyopathy ([place])    A. R/L HC 3/17: Normal coronary arteries, moderate pulmonary hypertension (PASP 65 mmHg), elevated LV filling pressures (LVEDP 45 mmHg)   . NSVT (nonsustained ventricular tachycardia)  ([place]) 02/22/2016  . Polysubstance abuse   . Restless leg syndrome     Patient Active Problem List   Diagnosis Date Noted  . Prolonged QT interval 05/18/2017  . Hernia, inguinal, right 08/05/2016  . Non-ischemic cardiomyopathy ([place])   . NSVT (nonsustained ventricular tachycardia) ([place]) 02/22/2016  . Right-sided chest pain   . Back pain   . Volume overload 02/20/2016  . Solitary pulmonary nodule 10/10/2015  . Upper airway cough syndrome 10/09/2015  . Cholecystitis 04/18/2014  . Combined congestive systolic and diastolic heart failure ([place]) 04/18/2014  . Abdominal pain 04/18/2014  . Hyperkalemia 03/08/2014  . Nausea & vomiting 03/08/2014  . End stage renal disease ([place]) 09/03/2012  . Epistaxis 07/15/2012  . Acute on chronic renal failure ([place]) 07/15/2012  . Anemia due to blood loss, acute 07/15/2012  . Elevated troponin 07/15/2012  . Metabolic acidosis 10/11/2535  . Hypocalcemia 07/15/2012  . ALCOHOL ABUSE 11/26/2010  . Cigarette smoker 11/26/2010  . COCAINE ABUSE 11/26/2010  . ESRD (end stage renal disease) on dialysis ([place]) 11/26/2010  . 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 RENAL FAILURE 07/13/2009    Past Surgical History:  Procedure Laterality Date  . [place] TRANSPOSITION  01/19/2013   Procedure: BASCILIC VEIN TRANSPOSITION;  Surgeon: Conrad [place], MD;  Location: [facility];  Service: Vascular;  Laterality: Left;  left 2nd stage basilic vein transposition  . CARDIAC CATHETERIZATION N/A 02/25/2016  Procedure: Right/Left Heart Cath and Coronary Angiography;  Surgeon: Burnell Blanks, MD;  Location: [place] [facility] CV LAB;  Service: Cardiovascular;  Laterality: N/A;  . INGUINAL HERNIA REPAIR Right 08/05/2016   Procedure: RIGHT INGUINAL HERNIA REPAIR WITH MESH;  Surgeon: Donnie Mesa, MD;  Location: [facility];  Service: General;  Laterality: Right;  . INSERTION OF DIALYSIS CATHETER  07/17/2012   Procedure:  INSERTION OF DIALYSIS CATHETER;  Surgeon: Conrad [place], MD;  Location: [place] [facility];  Service: Vascular;  Laterality: Right;  right internal jugular  . INSERTION OF MESH Right 08/05/2016   Procedure: INSERTION OF MESH;  Surgeon: Donnie Mesa, MD;  Location: [facility];  Service: General;  Laterality: Right;  . REVISON OF ARTERIOVENOUS FISTULA Left 01/06/2017   Procedure: REVISON OF ARTERIOVENOUS FISTULA;  Surgeon: Conrad , MD;  Location: [facility];  Service: Vascular;  Laterality: Left;  . UMBILICAL HERNIA REPAIR N/A 08/05/2016   Procedure: [place];  Surgeon: Donnie Mesa, MD;  Location: [place];  Service: General;  Laterality: N/A;  . VENOGRAM N/A 08/09/2012   Procedure: VENOGRAM;  Surgeon: Conrad , MD;  Location: [facility] CATH LAB;  Service: Cardiovascular;  Laterality: N/A;       Home Medications    Prior to Admission medications   Medication Sig Start Date End Date Taking? Authorizing Provider  calcium acetate (PHOSLO) 667 MG capsule Take 3 capsules (2,001 mg total) by mouth 3 (three) times daily with meals. 04/21/14   Mikhail, Velta Addison, DO  carvedilol (COREG) 25 MG tablet Take 25 mg by mouth 2 (two) times daily. 07/10/16   [provider]  clonazePAM (KLONOPIN) 0.5 MG tablet Take 0.5 mg by mouth daily as needed for anxiety. 07/11/16   [provider]  isosorbide mononitrate (IMDUR) 60 MG 24 hr tablet Take 60 mg by mouth daily. 07/21/12   Orson Eva, MD    Family History Family History  Problem Relation Age of Onset  . Hypertension Mother   . Diabetes Mother   . Hypertension Father     Social History Social History  Substance Use Topics  . Smoking status: Current Every Day Smoker    Packs/day: 0.25    Years: 10.00    Types: Cigarettes  . Smokeless tobacco: Current User     Comment: 1- 2 cigs a day  . Alcohol use Yes     Comment: Patient not forthcoming.  2-3 beers per week     Allergies   Losartan   Review of Systems Review of Systems  All  other systems reviewed and are negative.    Physical Exam Updated Vital Signs BP (!) 105/94 (BP Location: Right Arm)   Pulse 66   Temp 97.8 F (36.6 C) (Oral)   Resp 13   Ht 5\' 9"  (1.753 m)   Wt 76.7 kg (169 lb 3.2 oz)   SpO2 100%   BMI 24.99 kg/m   Physical Exam  Constitutional: He appears well-developed and well-nourished. No distress.  HENT:  Head: Normocephalic and atraumatic.  Mouth/Throat: Oropharynx is clear and moist. No oropharyngeal exudate.  Eyes: Conjunctivae and EOM are normal. Pupils are equal, round, and reactive to light. Right eye exhibits no discharge. Left eye exhibits no discharge. No scleral icterus.  Neck: Normal range of motion. Neck supple. No JVD present. No thyromegaly present.  Cardiovascular: Normal rate, regular rhythm, normal heart sounds and intact distal pulses.  Exam reveals no gallop and no friction rub.   No murmur heard.  Pulmonary/Chest: Effort normal and breath sounds normal. No respiratory distress. He has no wheezes. He has no rales.  Abdominal: Soft. Bowel sounds are normal. He exhibits no distension and no mass. There is no tenderness.  Musculoskeletal: Normal range of motion. He exhibits no edema or tenderness.  Lymphadenopathy:    He has no cervical adenopathy.  Neurological: He is alert. Coordination normal.  Skin: Skin is warm and dry. No rash noted. No erythema.  Psychiatric: He has a normal mood and affect. His behavior is normal.  Nursing note and vitals reviewed.    ED Treatments / Results  Labs (all labs ordered are listed, but only abnormal results are displayed) Labs Reviewed  BASIC METABOLIC PANEL - Abnormal; Notable for the following:       Result Value   Chloride 93 (*)    Glucose, Bld 138 (*)    BUN 30 (*)    Creatinine, Ser 8.17 (*)    Calcium 7.8 (*)    GFR calc non Af Amer 7 (*)    GFR calc Af Amer 8 (*)    All other components within normal limits  CBC - Abnormal; Notable for the following:    RBC 3.97  (*)    Hemoglobin 12.2 (*)    HCT 36.7 (*)    All other components within normal limits  I-STAT TROPOININ, ED - Abnormal; Notable for the following:    Troponin i, poc 0.24 (*)    All other components within normal limits  MAGNESIUM  TROPONIN I  TROPONIN I  TROPONIN I    EKG  EKG Interpretation  Date/Time:  Monday May 18 2017 16:14:27 EDT Ventricular Rate:  73 PR Interval:  194 QRS Duration: 108 QT Interval:  596 QTC Calculation: 656 R Axis:   164 Text Interpretation:  * Critical Test Result: Long QTc Normal sinus rhythm Possible Left atrial enlargement Left posterior fascicular block Anterior infarct , age undetermined Abnormal ECG Since last tracing QT prolonged Confirmed by Noemi Chapel [phone]) on 05/18/2017 6:00:40 PM       Radiology Dg Chest 2 View  Result Date: 05/18/2017 CLINICAL DATA:  Shortness of breath EXAM: CHEST  2 VIEW COMPARISON:  02/20/2016 FINDINGS: Mild cardiomegaly. No edema or pleural effusion. No pneumothorax. Probable old right eighth and ninth rib fractures. IMPRESSION: Mild cardiomegaly.  No edema or infiltrate. Electronically Signed   By: Donavan Foil M.D.   On: 05/18/2017 17:37    Procedures Procedures (including critical care time)  Medications Ordered in ED Medications  magnesium sulfate IVPB 2 g 50 mL (2 g Intravenous New Bag/Given 05/18/17 1854)     Initial Impression / Assessment and Plan / ED Course  I have reviewed the triage vital signs and the nursing notes.  Pertinent labs & imaging results that were available during my care of the patient were reviewed by me and considered in my medical decision making (see chart for details).     The patient has a significant prolonged QT, I will give him magnesium, I do not see any reason for him to have this based on his home medications, he does not have congenital prolonged QT. I'm not sure that this explains his shortness of breath on exertion but with an elevated troponin despite being a  dialysis patient and likely having a baseline elevated troponin I will admit him to the hospital for further evaluation. He does not have any chest pain at this time.   Magnesium ordered, CT angiogram ordered, discussed  with the hospitalist will admit.  Final Clinical Impressions(s) / ED Diagnoses   Final diagnoses:  Chest pain  QT prolongation  DOE (dyspnea on exertion)    New Prescriptions New Prescriptions   No medications on file     Noemi Chapel, MD 05/18/17 1914

## 2017-05-19 ENCOUNTER — Inpatient Hospital Stay (HOSPITAL_COMMUNITY): Payer: Medicare Other

## 2017-05-19 DIAGNOSIS — Z833 Family history of diabetes mellitus: Secondary | ICD-10-CM | POA: Diagnosis not present

## 2017-05-19 DIAGNOSIS — I5021 Acute systolic (congestive) heart failure: Secondary | ICD-10-CM | POA: Diagnosis not present

## 2017-05-19 DIAGNOSIS — Z91128 Patient's intentional underdosing of medication regimen for other reason: Secondary | ICD-10-CM | POA: Diagnosis not present

## 2017-05-19 DIAGNOSIS — R945 Abnormal results of liver function studies: Secondary | ICD-10-CM | POA: Diagnosis present

## 2017-05-19 DIAGNOSIS — R0609 Other forms of dyspnea: Secondary | ICD-10-CM | POA: Diagnosis present

## 2017-05-19 DIAGNOSIS — R1011 Right upper quadrant pain: Secondary | ICD-10-CM | POA: Diagnosis not present

## 2017-05-19 DIAGNOSIS — I2729 Other secondary pulmonary hypertension: Secondary | ICD-10-CM | POA: Diagnosis present

## 2017-05-19 DIAGNOSIS — N186 End stage renal disease: Secondary | ICD-10-CM

## 2017-05-19 DIAGNOSIS — R778 Other specified abnormalities of plasma proteins: Secondary | ICD-10-CM | POA: Diagnosis present

## 2017-05-19 DIAGNOSIS — I132 Hypertensive heart and chronic kidney disease with heart failure and with stage 5 chronic kidney disease, or end stage renal disease: Secondary | ICD-10-CM | POA: Diagnosis present

## 2017-05-19 DIAGNOSIS — Z992 Dependence on renal dialysis: Secondary | ICD-10-CM | POA: Diagnosis not present

## 2017-05-19 DIAGNOSIS — I5020 Unspecified systolic (congestive) heart failure: Secondary | ICD-10-CM | POA: Diagnosis not present

## 2017-05-19 DIAGNOSIS — R7989 Other specified abnormal findings of blood chemistry: Secondary | ICD-10-CM | POA: Diagnosis not present

## 2017-05-19 DIAGNOSIS — F1410 Cocaine abuse, uncomplicated: Secondary | ICD-10-CM | POA: Diagnosis not present

## 2017-05-19 DIAGNOSIS — I429 Cardiomyopathy, unspecified: Secondary | ICD-10-CM | POA: Diagnosis present

## 2017-05-19 DIAGNOSIS — F17210 Nicotine dependence, cigarettes, uncomplicated: Secondary | ICD-10-CM | POA: Diagnosis present

## 2017-05-19 DIAGNOSIS — I4581 Long QT syndrome: Secondary | ICD-10-CM | POA: Diagnosis present

## 2017-05-19 DIAGNOSIS — I5043 Acute on chronic combined systolic (congestive) and diastolic (congestive) heart failure: Secondary | ICD-10-CM | POA: Diagnosis present

## 2017-05-19 DIAGNOSIS — F1010 Alcohol abuse, uncomplicated: Secondary | ICD-10-CM | POA: Diagnosis present

## 2017-05-19 DIAGNOSIS — D631 Anemia in chronic kidney disease: Secondary | ICD-10-CM | POA: Diagnosis not present

## 2017-05-19 DIAGNOSIS — T447X6A Underdosing of beta-adrenoreceptor antagonists, initial encounter: Secondary | ICD-10-CM | POA: Diagnosis present

## 2017-05-19 DIAGNOSIS — Z8249 Family history of ischemic heart disease and other diseases of the circulatory system: Secondary | ICD-10-CM | POA: Diagnosis not present

## 2017-05-19 DIAGNOSIS — K8020 Calculus of gallbladder without cholecystitis without obstruction: Secondary | ICD-10-CM | POA: Diagnosis present

## 2017-05-19 DIAGNOSIS — I120 Hypertensive chronic kidney disease with stage 5 chronic kidney disease or end stage renal disease: Secondary | ICD-10-CM | POA: Diagnosis not present

## 2017-05-19 DIAGNOSIS — N2581 Secondary hyperparathyroidism of renal origin: Secondary | ICD-10-CM | POA: Diagnosis not present

## 2017-05-19 DIAGNOSIS — R188 Other ascites: Secondary | ICD-10-CM | POA: Diagnosis not present

## 2017-05-19 LAB — LIPID PANEL
Cholesterol: 62 mg/dL (ref 0–200)
HDL: 29 mg/dL — ABNORMAL LOW (ref >40)
LDL CALC: 22 mg/dL (ref 0–99)
TRIGLYCERIDES: 56 mg/dL (ref <150)
Total CHOL/HDL Ratio: 2.1 RATIO
VLDL: 11 mg/dL (ref 0–40)

## 2017-05-19 LAB — COMPREHENSIVE METABOLIC PANEL
ALBUMIN: 3.2 g/dL — AB (ref 3.5–5.0)
ALK PHOS: 59 U/L (ref 38–126)
ALT: 124 U/L — ABNORMAL HIGH (ref 17–63)
ANION GAP: 17 — AB (ref 5–15)
AST: 77 U/L — ABNORMAL HIGH (ref 15–41)
BILIRUBIN TOTAL: 1.2 mg/dL (ref 0.3–1.2)
BUN: 39 mg/dL — ABNORMAL HIGH (ref 6–20)
CALCIUM: 7.4 mg/dL — AB (ref 8.9–10.3)
CO2: 26 mmol/L (ref 22–32)
Chloride: 93 mmol/L — ABNORMAL LOW (ref 101–111)
Creatinine, Ser: 9.34 mg/dL — ABNORMAL HIGH (ref 0.61–1.24)
GFR calc non Af Amer: 6 mL/min — ABNORMAL LOW (ref >60)
GFR, EST AFRICAN AMERICAN: 7 mL/min — AB (ref >60)
GLUCOSE: 90 mg/dL (ref 65–99)
Potassium: 4 mmol/L (ref 3.5–5.1)
Sodium: 136 mmol/L (ref 135–145)
TOTAL PROTEIN: 6.5 g/dL (ref 6.5–8.1)

## 2017-05-19 LAB — CBC
HCT: 36.6 % — ABNORMAL LOW (ref 39.0–52.0)
HEMOGLOBIN: 12.1 g/dL — AB (ref 13.0–17.0)
MCH: 30.7 pg (ref 26.0–34.0)
MCHC: 33.1 g/dL (ref 30.0–36.0)
MCV: 92.9 fL (ref 78.0–100.0)
PLATELETS: 150 10*3/uL (ref 150–400)
RBC: 3.94 MIL/uL — ABNORMAL LOW (ref 4.22–5.81)
RDW: 15.7 % — AB (ref 11.5–15.5)
WBC: 4.9 10*3/uL (ref 4.0–10.5)

## 2017-05-19 LAB — PROTIME-INR
INR: 1.4
PROTHROMBIN TIME: 17.3 s — AB (ref 11.4–15.2)

## 2017-05-19 LAB — TROPONIN I
TROPONIN I: 0.26 ng/mL — AB (ref <0.03)
Troponin I: 0.28 ng/mL (ref <0.03)

## 2017-05-19 LAB — LACTIC ACID, PLASMA: Lactic Acid, Venous: 2.3 mmol/L (ref 0.5–1.9)

## 2017-05-19 LAB — TSH: TSH: 4.659 u[IU]/mL — ABNORMAL HIGH (ref 0.350–4.500)

## 2017-05-19 LAB — PHOSPHORUS: Phosphorus: 6.2 mg/dL — ABNORMAL HIGH (ref 2.5–4.6)

## 2017-05-19 LAB — MAGNESIUM: MAGNESIUM: 2.9 mg/dL — AB (ref 1.7–2.4)

## 2017-05-19 LAB — MRSA PCR SCREENING: MRSA BY PCR: NEGATIVE

## 2017-05-19 LAB — HIV ANTIBODY (ROUTINE TESTING W REFLEX): HIV SCREEN 4TH GENERATION: NONREACTIVE

## 2017-05-19 MED ORDER — ADULT MULTIVITAMIN W/MINERALS CH
1.0000 | ORAL_TABLET | Freq: Every day | ORAL | Status: DC
  Administered 2017-05-19 – 2017-05-20 (×2): 1 via ORAL
  Filled 2017-05-19 (×2): qty 1

## 2017-05-19 MED ORDER — TECHNETIUM TC 99M MEBROFENIN IV KIT
5.0400 | PACK | Freq: Once | INTRAVENOUS | Status: AC | PRN
  Administered 2017-05-19: 5.04 via INTRAVENOUS

## 2017-05-19 MED ORDER — LORAZEPAM 1 MG PO TABS: 1.0000 mg | ORAL_TABLET | Freq: Four times a day (QID) | ORAL | Status: DC | PRN

## 2017-05-19 MED ORDER — LORAZEPAM 2 MG/ML IJ SOLN: 1.0000 mg | Freq: Four times a day (QID) | INTRAMUSCULAR | Status: DC | PRN

## 2017-05-19 MED ORDER — CALCITRIOL 0.5 MCG PO CAPS: 0.7500 ug | ORAL_CAPSULE | ORAL | Status: DC

## 2017-05-19 MED ORDER — THIAMINE HCL 100 MG/ML IJ SOLN: 100.0000 mg | Freq: Every day | INTRAMUSCULAR | Status: DC

## 2017-05-19 MED ORDER — VITAMIN B-1 100 MG PO TABS
100.0000 mg | ORAL_TABLET | Freq: Every day | ORAL | Status: DC
  Administered 2017-05-19 – 2017-05-20 (×2): 100 mg via ORAL
  Filled 2017-05-19 (×2): qty 1

## 2017-05-19 MED ORDER — SODIUM CHLORIDE 0.9 % IV SOLN
125.0000 mg | INTRAVENOUS | Status: DC
  Administered 2017-05-20: 125 mg via INTRAVENOUS
  Filled 2017-05-19 (×3): qty 10

## 2017-05-19 MED ORDER — FOLIC ACID 1 MG PO TABS
1.0000 mg | ORAL_TABLET | Freq: Every day | ORAL | Status: DC
  Administered 2017-05-19 – 2017-05-20 (×2): 1 mg via ORAL
  Filled 2017-05-19 (×2): qty 1

## 2017-05-19 NOTE — Progress Notes (Signed)
Forrest Moron, NP notified of elevated lactic acid and CT results.  Jodell Cipro

## 2017-05-19 NOTE — Consult Note (Signed)
[facility] Renal Consultation Note  Indication for Consultation:  Management of ESRD/hemodialysis; anemia, hypertension/volume and secondary hyperparathyroidism  HPI: [name] [name] is a 51 y.o. male with ESRD 2/2 HTN( MWF  HD started 07/22/2012),ho cocaine last used "last year"/ and current cannibis,  ho ED 40-45% 2013 and Hosp 5/5-04/21/14 abdom pain poss symptomatic gallstones ; surgery recommended but not emergent due to cocaine . Now presents with  SOB  And 4 days RUQ pain and tenderness./reports no fever no chest pain no diarrhea.Also reports some dyspnea but had full Hd tx yesterday and actually left .1 kg below edw with no cramping. Admit cxr show no edema.CT evidence of cirrhosis and cholelithiasis without cholecystitis will obtain right upper quadrant ultrasound to value gallbladder better.  Has ho Alcohol abuse. Also had prolonged  QT and Elevated troponin 0..28>0.26 in setting of ESRD and CM  HIDA Scan today pending /CCS seeing/ Currently in room no sob.and minimal abd discomfort and hungry  npo for Hida scan.    Past Medical History:  Diagnosis Date  . CHF (congestive heart failure) ([place])   . Combined congestive systolic and diastolic heart failure ([place]) 04/18/2014   A. Echo 8/13: Severe LVH, EF 40-45%, inferoposterior akinesis, grade 2 diastolic dysfunction, moderate LAE, mild RVE, mildly reduced RVSF, mild RAE; cannot rule out R atrial mass-suggest TEE or cardiac MRI  //  B. Echo 3/17: Mild LVH, EF 25-30%, diffuse HK, grade 2 diastolic dysfunction, mild MR, severe LAE,  moderately reduced RVSF, severe RAE, mild TR, moderate PI, PASP 55 mmHg    . ESRD (end stage renal disease) on dialysis [facility])    MWF- [place] (05/18/2017)  . Hemodialysis patient ([place])    M,W,F  . Hypertension   . Medical non-compliance   . Non-ischemic cardiomyopathy ([place])    A. R/L HC 3/17: Normal coronary arteries, moderate pulmonary hypertension (PASP 65 mmHg), elevated LV filling pressures  (LVEDP 45 mmHg)   . NSVT (nonsustained ventricular tachycardia) ([place]) 02/22/2016  . Polysubstance abuse   . Restless leg syndrome     Past Surgical History:  Procedure Laterality Date  . [place] TRANSPOSITION  01/19/2013   Procedure: BASCILIC VEIN TRANSPOSITION;  Surgeon: Conrad [place], MD;  Location: [place];  Service: Vascular;  Laterality: Left;  left 2nd stage basilic vein transposition  . CARDIAC CATHETERIZATION N/A 02/25/2016   Procedure: Right/Left Heart Cath and Coronary Angiography;  Surgeon: Burnell Blanks, MD;  Location: [place] CV LAB;  Service: Cardiovascular;  Laterality: N/A;  . INGUINAL HERNIA REPAIR Right 08/05/2016   Procedure: RIGHT INGUINAL HERNIA REPAIR WITH MESH;  Surgeon: Donnie Mesa, MD;  Location: [place];  Service: General;  Laterality: Right;  . INSERTION OF DIALYSIS CATHETER  07/17/2012   Procedure: INSERTION OF DIALYSIS CATHETER;  Surgeon: Conrad [place], MD;  Location: [place];  Service: Vascular;  Laterality: Right;  right internal jugular  . INSERTION OF MESH Right 08/05/2016   Procedure: INSERTION OF MESH;  Surgeon: Donnie Mesa, MD;  Location: [place];  Service: General;  Laterality: Right;  . REVISON OF ARTERIOVENOUS FISTULA Left 01/06/2017   Procedure: REVISON OF ARTERIOVENOUS FISTULA;  Surgeon: Conrad [place], MD;  Location: [place];  Service: Vascular;  Laterality: Left;  . UMBILICAL HERNIA REPAIR N/A 08/05/2016   Procedure: [place];  Surgeon: Donnie Mesa, MD;  Location: [place];  Service: General;  Laterality: N/A;  . VENOGRAM N/A 08/09/2012   Procedure: VENOGRAM;  Surgeon: Conrad [place],  MD;  Location: [place] CATH LAB;  Service: Cardiovascular;  Laterality: N/A;      Family History  Problem Relation Age of Onset  . Hypertension Mother   . Diabetes Mother   . Hypertension Father       reports that he has been smoking Cigarettes.  He has a 3.00 pack-year smoking history. He has never used smokeless tobacco. He reports that he  drinks about 3.6 oz of alcohol per week . He reports that he does not use drugs.   Allergies  Allergen Reactions  . Losartan Cough    Prior to Admission medications   Medication Sig Start Date End Date Taking? Authorizing Provider  acetaminophen (TYLENOL) 325 MG tablet Take 650 mg by mouth every 6 (six) hours as needed for headache (pain).   Yes [provider]  calcium acetate (PHOSLO) 667 MG capsule Take 3 capsules (2,001 mg total) by mouth 3 (three) times daily with meals. Patient taking differently: Take 1,334-3,335 mg by mouth See admin instructions. Take 3-5 capsules (2001 - 3335 mg) by mouth 3 times daily with large meals, take 2 capsules (1334 mg) with snacks 04/21/14  Yes Mikhail, Maryann, DO  carvedilol (COREG) 25 MG tablet Take 25 mg by mouth 2 (two) times daily. 07/10/16  Yes [provider]  isosorbide mononitrate (IMDUR) 60 MG 24 hr tablet Take 60 mg by mouth daily. 07/21/12  Yes Tat, Shanon Brow, MD  Multiple Vitamin (MULTIVITAMIN WITH MINERALS) TABS tablet Take 1 tablet by mouth daily.   Yes [provider]     Anti-infectives    None      Results for orders placed or performed during the hospital encounter of 05/18/17 (from the past 48 hour(s))  Basic metabolic panel     Status: Abnormal   Collection Time: 05/18/17  4:30 PM  Result Value Ref Range   Sodium 139 135 - 145 mmol/L   Potassium 3.5 3.5 - 5.1 mmol/L   Chloride 93 (L) 101 - 111 mmol/L   CO2 31 22 - 32 mmol/L   Glucose, Bld 138 (H) 65 - 99 mg/dL   BUN 30 (H) 6 - 20 mg/dL   Creatinine, Ser 8.17 (H) 0.61 - 1.24 mg/dL   Calcium 7.8 (L) 8.9 - 10.3 mg/dL   GFR calc non Af Amer 7 (L) >60 mL/min   GFR calc Af Amer 8 (L) >60 mL/min    Comment: (NOTE) The eGFR has been calculated using the CKD EPI equation. This calculation has not been validated in all clinical situations. eGFR's persistently <60 mL/min signify possible Chronic Kidney Disease.    Anion gap 15 5 - 15  CBC     Status: Abnormal    Collection Time: 05/18/17  4:30 PM  Result Value Ref Range   WBC 4.5 4.0 - 10.5 K/uL   RBC 3.97 (L) 4.22 - 5.81 MIL/uL   Hemoglobin 12.2 (L) 13.0 - 17.0 g/dL   HCT 36.7 (L) 39.0 - 52.0 %   MCV 92.4 78.0 - 100.0 fL   MCH 30.7 26.0 - 34.0 pg   MCHC 33.2 30.0 - 36.0 g/dL   RDW 15.4 11.5 - 15.5 %   Platelets 162 150 - 400 K/uL  I-stat troponin, ED     Status: Abnormal   Collection Time: 05/18/17  4:54 PM  Result Value Ref Range   Troponin i, poc 0.24 (HH) 0.00 - 0.08 ng/mL   Comment NOTIFIED PHYSICIAN    Comment 3  Comment: Due to the release kinetics of cTnI, a negative result within the first hours of the onset of symptoms does not rule out myocardial infarction with certainty. If myocardial infarction is still suspected, repeat the test at appropriate intervals.   Magnesium     Status: Abnormal   Collection Time: 05/18/17  7:47 PM  Result Value Ref Range   Magnesium 2.8 (H) 1.7 - 2.4 mg/dL  Troponin I (q 6hr x 3)     Status: Abnormal   Collection Time: 05/18/17  7:47 PM  Result Value Ref Range   Troponin I 0.29 (HH) <0.03 ng/mL    Comment: CRITICAL RESULT CALLED TO, READ BACK BY AND VERIFIED WITH: GUIJOZA,A RN 05/18/2017 2044 JORDANS   Hepatic function panel     Status: Abnormal   Collection Time: 05/18/17  7:47 PM  Result Value Ref Range   Total Protein 6.7 6.5 - 8.1 g/dL   Albumin 3.4 (L) 3.5 - 5.0 g/dL   AST 69 (H) 15 - 41 U/L   ALT 120 (H) 17 - 63 U/L   Alkaline Phosphatase 68 38 - 126 U/L   Total Bilirubin 0.9 0.3 - 1.2 mg/dL   Bilirubin, Direct 0.1 0.1 - 0.5 mg/dL   Indirect Bilirubin 0.8 0.3 - 0.9 mg/dL  Lipase, blood     Status: None   Collection Time: 05/18/17  7:47 PM  Result Value Ref Range   Lipase 24 11 - 51 U/L  Lactic acid, plasma     Status: Abnormal   Collection Time: 05/18/17  9:15 PM  Result Value Ref Range   Lactic Acid, Venous 2.4 (HH) 0.5 - 1.9 mmol/L    Comment: CRITICAL RESULT CALLED TO, READ BACK BY AND VERIFIED  WITH: S.MIZE,RN 2208 05/18/17 G.MCADOO   MRSA PCR Screening     Status: None   Collection Time: 05/18/17 11:27 PM  Result Value Ref Range   MRSA by PCR NEGATIVE NEGATIVE    Comment:        The GeneXpert MRSA Assay (FDA approved for NASAL specimens only), is one component of a comprehensive MRSA colonization surveillance program. It is not intended to diagnose MRSA infection nor to guide or monitor treatment for MRSA infections.   Troponin I (q 6hr x 3)     Status: Abnormal   Collection Time: 05/19/17  1:54 AM  Result Value Ref Range   Troponin I 0.28 (HH) <0.03 ng/mL    Comment: CRITICAL VALUE NOTED.  VALUE IS CONSISTENT WITH PREVIOUSLY REPORTED AND CALLED VALUE.  TSH     Status: Abnormal   Collection Time: 05/19/17  1:54 AM  Result Value Ref Range   TSH 4.659 (H) 0.350 - 4.500 uIU/mL    Comment: Performed by a 3rd Generation assay with a functional sensitivity of <=0.01 uIU/mL.  Lactic acid, plasma     Status: Abnormal   Collection Time: 05/19/17  1:54 AM  Result Value Ref Range   Lactic Acid, Venous 2.3 (HH) 0.5 - 1.9 mmol/L    Comment: CRITICAL RESULT CALLED TO, READ BACK BY AND VERIFIED WITH: DUVALL,D RN 05/19/2017 0302 JORDANS   Troponin I (q 6hr x 3)     Status: Abnormal   Collection Time: 05/19/17  6:59 AM  Result Value Ref Range   Troponin I 0.26 (HH) <0.03 ng/mL    Comment: CRITICAL VALUE NOTED.  VALUE IS CONSISTENT WITH PREVIOUSLY REPORTED AND CALLED VALUE.  Magnesium     Status: Abnormal   Collection Time: 05/19/17  6:59  AM  Result Value Ref Range   Magnesium 2.9 (H) 1.7 - 2.4 mg/dL  Phosphorus     Status: Abnormal   Collection Time: 05/19/17  6:59 AM  Result Value Ref Range   Phosphorus 6.2 (H) 2.5 - 4.6 mg/dL  Comprehensive metabolic panel     Status: Abnormal   Collection Time: 05/19/17  6:59 AM  Result Value Ref Range   Sodium 136 135 - 145 mmol/L   Potassium 4.0 3.5 - 5.1 mmol/L   Chloride 93 (L) 101 - 111 mmol/L   CO2 26 22 - 32 mmol/L   Glucose,  Bld 90 65 - 99 mg/dL   BUN 39 (H) 6 - 20 mg/dL   Creatinine, Ser 9.34 (H) 0.61 - 1.24 mg/dL   Calcium 7.4 (L) 8.9 - 10.3 mg/dL   Total Protein 6.5 6.5 - 8.1 g/dL   Albumin 3.2 (L) 3.5 - 5.0 g/dL   AST 77 (H) 15 - 41 U/L   ALT 124 (H) 17 - 63 U/L   Alkaline Phosphatase 59 38 - 126 U/L   Total Bilirubin 1.2 0.3 - 1.2 mg/dL   GFR calc non Af Amer 6 (L) >60 mL/min   GFR calc Af Amer 7 (L) >60 mL/min    Comment: (NOTE) The eGFR has been calculated using the CKD EPI equation. This calculation has not been validated in all clinical situations. eGFR's persistently <60 mL/min signify possible Chronic Kidney Disease.    Anion gap 17 (H) 5 - 15  CBC     Status: Abnormal   Collection Time: 05/19/17  6:59 AM  Result Value Ref Range   WBC 4.9 4.0 - 10.5 K/uL   RBC 3.94 (L) 4.22 - 5.81 MIL/uL   Hemoglobin 12.1 (L) 13.0 - 17.0 g/dL   HCT 36.6 (L) 39.0 - 52.0 %   MCV 92.9 78.0 - 100.0 fL   MCH 30.7 26.0 - 34.0 pg   MCHC 33.1 30.0 - 36.0 g/dL   RDW 15.7 (H) 11.5 - 15.5 %   Platelets 150 150 - 400 K/uL  Protime-INR     Status: Abnormal   Collection Time: 05/19/17  6:59 AM  Result Value Ref Range   Prothrombin Time 17.3 (H) 11.4 - 15.2 seconds   INR 1.40   Lipid panel     Status: Abnormal   Collection Time: 05/19/17  6:59 AM  Result Value Ref Range   Cholesterol 62 0 - 200 mg/dL   Triglycerides 56 <150 mg/dL   HDL 29 (L) >40 mg/dL   Total CHOL/HDL Ratio 2.1 RATIO   VLDL 11 0 - 40 mg/dL   LDL Cholesterol 22 0 - 99 mg/dL    Comment:        Total Cholesterol/HDL:CHD Risk Coronary Heart Disease Risk Table                     Men   Women  1/2 Average Risk   3.4   3.3  Average Risk       5.0   4.4  2 X Average Risk   9.6   7.1  3 X Average Risk  23.4   11.0        Use the calculated Patient Ratio above and the CHD Risk Table to determine the patient's CHD Risk.        ATP III CLASSIFICATION (LDL):  <100     mg/dL   Optimal  100-129  mg/dL   Near or Above  Optimal  130-159  mg/dL   Borderline  160-189  mg/dL   High  >190     mg/dL   Very High     ROS: see hpi for positives  Physical Exam: Vitals:   05/19/17 0027 05/19/17 0611  BP: (!) 122/92 111/86  Pulse: 63 62  Resp: 18   Temp: 97.7 F (36.5 C) 97.7 F (36.5 C)     General: alert AAM  NAD , OX3 HEENT: [place], MMM, poor dental  dentation  Neck: supple, no jvd Heart: RRR no mur, rub or gallop Lungs: CTA  Bila Abdomen: BS pos , soft , ND, Tender R U/L quads  Extremities: no peal edema Skin: no overt rash Neuro: Alert OX3 ,no acute focal deficits  noted Dialysis Access: Pos. Bruit l AVF  Dialysis Orders: [facility]: [facility] on MWF . EDW 76.5  HD Bath 2k, 2ca  Time 4hr Heparin 5000. Access  lUA AVF     PO Calcitriol 0.75 mcg/HD Epogen no ESA    Venofer  149m  q hd  On 06/01/17    Assessment/Plan 1. ESRD -  Plan HD MWF No hd need today  2. R sided abd Pain  With CT cirrhosis and cholelithiasis -plan per admit  3. Hypertension/volume  - bp and vol stable / on Coreg 269mand [place] [id number] 4. Anemia  - hgb 12.1 no esa / on fe load continue  5. Metabolic bone disease -  Po vit d on d and Phoslo binder  DavErnest HaberA-C CarLower [facility] Beeper [phone]/2018, 10:33 AM

## 2017-05-19 NOTE — Progress Notes (Signed)
High risk for surgery with cirrhosis and ESRD.  HIDA is appropriate.  [name] [name]. Dahlia Bailiff, MD, [place] [phone] [facility]

## 2017-05-19 NOTE — Progress Notes (Signed)
PROGRESS NOTE    [name] [name]  JIR:[id number] DOB: 04-03-1966 DOA: 05/18/2017 PCP: [facility]   Outpatient Specialists:     Brief Narrative:  [name] [name] is a 51 y.o. male with medical history significant of HTN, ESRD on HD, polysubstance abuse including cocaine and ETOH, combined systolic and diastolic CHF, secondary hyperparathyroidism, anemia, VT in the setting of cocaine and HTN urgency in 2011, medical non-adherence.  Presented with 4 days dyspnea and exertional shortness of breath with some orthopnea. Endorses RUQ pain and tenderness. He been feeling un well. No nausea.  symptoms has progressively been getting worse he denies any leg edema feels that he always been dialyzed down to her dry weight. He has occasional nonproductive cough but has been getting better. He continues to smoke about 2 cigarettes a day. Reports no cocaine for the past 90 days Last hemodialysis was this a.m.     Assessment & Plan:   Active Problems:   Alcohol abuse   Cocaine abuse   ESRD (end stage renal disease) on dialysis (HCC)   Elevated troponin   Combined congestive systolic and diastolic heart failure (HCC)   Non-ischemic cardiomyopathy (HCC)   Prolonged QT interval   Prolonged Q-T interval on ECG   RUQ pain   Acute systolic CHF (congestive heart failure) (HCC)   Elevated troponin chronic in the setting of end-stage renal disease and cardiomyopathy -cardiology consult  Prolonged QT interval monitor on telemetry hold QT prolonging medications, check electrolytes and replace as needed  Alcohol abuse -LFTs elevated -CIWA protocol  Cocaine abuse - currently deneis  Combined congestive systolic and diastolic heart failure ([place])  -repeat echo, appreciate cardiology and nephrology input.    RUQ pain -  -right upper quadrant ultrasound:Few small layering gallstones. Gallbladder wall thickening without sonographic Murphy's sign. Findings may reflect chronic cholecystitis.  Recommend clinical correlation to exclude acute cholecystitis. -Alcohol   abuse may be contributing to liver disease   -HIDA scan   DVT prophylaxis:  Lovenox   Code Status: Full Code   Family Communication:   Disposition Plan:     Consultants:   GS  Cards  renal   Subjective: Asking to eat but still c/o RUQ Pain  Objective: Vitals:   05/18/17 1930 05/18/17 2223 05/19/17 0027 05/19/17 0611  BP: 111/88 (!) 125/95 (!) 122/92 111/86  Pulse: 63 62 63 62  Resp: 17 20 18    Temp:  97.5 F (36.4 C) 97.7 F (36.5 C) 97.7 F (36.5 C)  TempSrc:  Oral Oral Oral  SpO2: 100% 100% 95% 97%  Weight:  75.4 kg (166 lb 4.8 oz)  75.3 kg (166 lb)  Height:  5\' 9"  (1.753 m)      Intake/Output Summary (Last 24 hours) at 05/19/17 1044 Last data filed at 05/19/17 0900  Gross per 24 hour  Intake              530 ml  Output                0 ml  Net              530 ml   Filed Weights   05/18/17 1615 05/18/17 2223 05/19/17 0611  Weight: 76.7 kg (169 lb 3.2 oz) 75.4 kg (166 lb 4.8 oz) 75.3 kg (166 lb)    Examination:  General exam: Appears calm and comfortable  Respiratory system: Clear to auscultation. Respiratory effort normal. Cardiovascular system: S1 & S2 heard, RRR. No JVD, murmurs, rubs, gallops or  clicks. No pedal edema. Gastrointestinal system: +BS, soft, mildly tender to palpation RUQ Central nervous system: Alert and oriented. No focal neurological deficits. Extremities: Symmetric 5 x 5 power.     Data Reviewed: I have personally reviewed following labs and imaging studies  CBC:  Recent Labs Lab 05/18/17 1630 05/19/17 0659  WBC 4.5 4.9  HGB 12.2* 12.1*  HCT 36.7* 36.6*  MCV 92.4 92.9  PLT 162 947   Basic Metabolic Panel:  Recent Labs Lab 05/18/17 1630 05/18/17 1947 05/19/17 0659  NA 139  --  136  K 3.5  --  4.0  CL 93*  --  93*  CO2 31  --  26  GLUCOSE 138*  --  90  BUN 30*  --  39*  CREATININE 8.17*  --  9.34*  CALCIUM 7.8*  --  7.4*    MG  --  2.8* 2.9*  PHOS  --   --  6.2*   GFR: Estimated Creatinine Clearance: 9.6 mL/min (A) (by C-G formula based on SCr of 9.34 mg/dL (H)). Liver Function Tests:  Recent Labs Lab 05/18/17 1947 05/19/17 0659  AST 69* 77*  ALT 120* 124*  ALKPHOS 68 59  BILITOT 0.9 1.2  PROT 6.7 6.5  ALBUMIN 3.4* 3.2*    Recent Labs Lab 05/18/17 1947  LIPASE 24   No results for input(s): AMMONIA in the last 168 hours. Coagulation Profile:  Recent Labs Lab 05/19/17 0659  INR 1.40   Cardiac Enzymes:  Recent Labs Lab 05/18/17 1947 05/19/17 0154 05/19/17 0659  TROPONINI 0.29* 0.28* 0.26*   BNP (last 3 results) No results for input(s): PROBNP in the last 8760 hours. HbA1C: No results for input(s): HGBA1C in the last 72 hours. CBG: No results for input(s): GLUCAP in the last 168 hours. Lipid Profile:  Recent Labs  05/19/17 0659  CHOL 62  HDL 29*  LDLCALC 22  TRIG 56  CHOLHDL 2.1   Thyroid Function Tests:  Recent Labs  05/19/17 0154  TSH 4.659*   Anemia Panel: No results for input(s): VITAMINB12, FOLATE, FERRITIN, TIBC, IRON, RETICCTPCT in the last 72 hours. Urine analysis:    Component Value Date/Time   COLORURINE YELLOW 04/19/2014 0109   APPEARANCEUR CLEAR 04/19/2014 0109   LABSPEC 1.012 04/19/2014 0109   PHURINE 7.0 04/19/2014 0109   GLUCOSEU NEGATIVE 04/19/2014 0109   HGBUR SMALL (A) 04/19/2014 0109   BILIRUBINUR NEGATIVE 04/19/2014 0109   KETONESUR NEGATIVE 04/19/2014 0109   PROTEINUR 100 (A) 04/19/2014 0109   UROBILINOGEN 0.2 04/19/2014 0109   NITRITE NEGATIVE 04/19/2014 0109   LEUKOCYTESUR TRACE (A) 04/19/2014 0109    ) Recent Results (from the past 240 hour(s))  MRSA PCR Screening     Status: None   Collection Time: 05/18/17 11:27 PM  Result Value Ref Range Status   MRSA by PCR NEGATIVE NEGATIVE Final    Comment:        The GeneXpert MRSA Assay (FDA approved for NASAL specimens only), is one component of a comprehensive MRSA  colonization surveillance program. It is not intended to diagnose MRSA infection nor to guide or monitor treatment for MRSA infections.       Anti-infectives    None       Radiology Studies: Dg Chest 2 View  Result Date: 05/18/2017 CLINICAL DATA:  Shortness of breath EXAM: CHEST  2 VIEW COMPARISON:  02/20/2016 FINDINGS: Mild cardiomegaly. No edema or pleural effusion. No pneumothorax. Probable old right eighth and ninth rib fractures. IMPRESSION: Mild cardiomegaly.  No edema or infiltrate. Electronically Signed   By: Donavan Foil M.D.   On: 05/18/2017 17:37   Ct Angio Chest Pe W Or Wo Contrast  Result Date: 05/18/2017 CLINICAL DATA:  Chest pain times 3-4 days with pain beneath right lower ribs. End-stage renal disease on dialysis greater than 5 years. Last dialysis was today. History of right inguinal hernia repair and umbilical hernia repair. EXAM: CT ANGIOGRAPHY CHEST CT ABDOMEN AND PELVIS WITH CONTRAST TECHNIQUE: Multidetector CT imaging of the chest was performed using the standard protocol during bolus administration of intravenous contrast. Multiplanar CT image reconstructions and MIPs were obtained to evaluate the vascular anatomy. Multidetector CT imaging of the abdomen and pelvis was performed using the standard protocol during bolus administration of intravenous contrast. CONTRAST:  100 cc Isovue 370 IV. COMPARISON:  02/21/2016 CT, 03/08/2014 CT FINDINGS: CTA CHEST FINDINGS Cardiovascular: Cardiomegaly. Reflux of contrast into the IVC and hepatic veins consistent with right heart failure. No pericardial effusion. Aortic atherosclerosis without aneurysm. No large central pulmonary embolus. Coronary arteriosclerosis is identified along the circumflex. Mediastinum/Nodes: Mild mediastinal lymphadenopathy measuring up to 14 mm in the right lower paratracheal and prevascular portions of the mediastinum possibly reactive in etiology. No hilar lymphadenopathy. Trachea, esophagus and thyroid  gland are unremarkable. Lungs/Pleura: Small right pleural effusion. No pneumonic consolidation. No dominant mass. Musculoskeletal: Chronic right lower rib fractures. No acute osseous abnormality. Osteoarthritis of the right glenohumeral joint with subcortical and subchondral cystic change. Review of the MIP images confirms the above findings. CT ABDOMEN and PELVIS FINDINGS Hepatobiliary: Cholelithiasis with slight thickened appearance of the gallbladder some which may be due to sympathetic thickening from small volume of ascites noted in the abdomen. Slightly nodular appearance of the liver surface may represent changes of cirrhosis. No space-occupying mass. Pancreas: No pancreatic ductal dilatation or mass. Spleen: No splenomegaly or mass. Adrenals/Urinary Tract: Several bilateral simple and complex renal cysts are noted some which demonstrate milk-of-calcium for example in the left upper pole, series 14, image 34 and others which are hyperdense, example 9 mm in diameter in the right upper pole image 29, too small to further characterize but similar in size dating back to 2015 and are likely of benign etiology. No obstructive uropathy. Renovascular calcifications are noted bilaterally. No obstructive uropathy. The urinary bladder is contracted. Renal cortical scarring is seen bilaterally. No adrenal mass is identified. Stomach/Bowel: Contracted stomach with normal small bowel rotation. No bowel obstruction or inflammation. Normal appendix. Scattered colonic diverticulosis is identified without acute diverticulitis. Vascular/Lymphatic: No aortic aneurysm or dissection. No lymphadenopathy. A few mildly enlarged inguinal lymph nodes are seen bilaterally which may be reactive in etiology the largest approximately mm short axis. Reproductive: Mildly enlarged prostate gland measuring 4.6 x 3.7 cm in transverse by AP dimension. Unremarkable seminal vesicles. Other: Small volume of ascites is noted within the abdomen  outlining the liver, pancreas, spleen and along the pericolic gutters. Musculoskeletal: Degenerative disc disease L5-S1 with slight retrolisthesis grade 1 of L5 on S1. No acute nor suspicious osseous lesions. Review of the MIP images confirms the above findings. IMPRESSION: 1. Stable cardiomegaly with changes suggesting right heart failure with reflux of contrast into the IVC and right hepatic veins with trace right effusion. 2. Coronary arteriosclerosis and aortic atherosclerosis. No aneurysm or dissection. No acute pulmonary embolus. 3. Slightly nodular appearance of the liver surface which may reflect changes of cirrhosis. Small volume of ascites in the abdomen. 4. Mild reactive adenopathy suspected of the mediastinal lymph nodes with prevascular  and right paratracheal lymph nodes measuring up to 14 mm short axis. Mild enlargement of the inguinal lymph nodes measuring up to 8 mm short axis. 5. Sympathetic thickening of the gallbladder wall likely secondary to adjacent ascites and cholelithiasis. Findings are similar to that seen previously. 6. Simple and complex renal cysts with cortical scarring of the kidneys. No dominant mass noted. 7. Colonic diverticulosis without acute diverticulitis. Electronically Signed   By: Ashley Royalty M.D.   On: 05/18/2017 22:37   Ct Abdomen Pelvis W Contrast  Result Date: 05/18/2017 CLINICAL DATA:  Chest pain times 3-4 days with pain beneath right lower ribs. End-stage renal disease on dialysis greater than 5 years. Last dialysis was today. History of right inguinal hernia repair and umbilical hernia repair. EXAM: CT ANGIOGRAPHY CHEST CT ABDOMEN AND PELVIS WITH CONTRAST TECHNIQUE: Multidetector CT imaging of the chest was performed using the standard protocol during bolus administration of intravenous contrast. Multiplanar CT image reconstructions and MIPs were obtained to evaluate the vascular anatomy. Multidetector CT imaging of the abdomen and pelvis was performed using the  standard protocol during bolus administration of intravenous contrast. CONTRAST:  100 cc Isovue 370 IV. COMPARISON:  02/21/2016 CT, 03/08/2014 CT FINDINGS: CTA CHEST FINDINGS Cardiovascular: Cardiomegaly. Reflux of contrast into the IVC and hepatic veins consistent with right heart failure. No pericardial effusion. Aortic atherosclerosis without aneurysm. No large central pulmonary embolus. Coronary arteriosclerosis is identified along the circumflex. Mediastinum/Nodes: Mild mediastinal lymphadenopathy measuring up to 14 mm in the right lower paratracheal and prevascular portions of the mediastinum possibly reactive in etiology. No hilar lymphadenopathy. Trachea, esophagus and thyroid gland are unremarkable. Lungs/Pleura: Small right pleural effusion. No pneumonic consolidation. No dominant mass. Musculoskeletal: Chronic right lower rib fractures. No acute osseous abnormality. Osteoarthritis of the right glenohumeral joint with subcortical and subchondral cystic change. Review of the MIP images confirms the above findings. CT ABDOMEN and PELVIS FINDINGS Hepatobiliary: Cholelithiasis with slight thickened appearance of the gallbladder some which may be due to sympathetic thickening from small volume of ascites noted in the abdomen. Slightly nodular appearance of the liver surface may represent changes of cirrhosis. No space-occupying mass. Pancreas: No pancreatic ductal dilatation or mass. Spleen: No splenomegaly or mass. Adrenals/Urinary Tract: Several bilateral simple and complex renal cysts are noted some which demonstrate milk-of-calcium for example in the left upper pole, series 14, image 34 and others which are hyperdense, example 9 mm in diameter in the right upper pole image 29, too small to further characterize but similar in size dating back to 2015 and are likely of benign etiology. No obstructive uropathy. Renovascular calcifications are noted bilaterally. No obstructive uropathy. The urinary bladder is  contracted. Renal cortical scarring is seen bilaterally. No adrenal mass is identified. Stomach/Bowel: Contracted stomach with normal small bowel rotation. No bowel obstruction or inflammation. Normal appendix. Scattered colonic diverticulosis is identified without acute diverticulitis. Vascular/Lymphatic: No aortic aneurysm or dissection. No lymphadenopathy. A few mildly enlarged inguinal lymph nodes are seen bilaterally which may be reactive in etiology the largest approximately mm short axis. Reproductive: Mildly enlarged prostate gland measuring 4.6 x 3.7 cm in transverse by AP dimension. Unremarkable seminal vesicles. Other: Small volume of ascites is noted within the abdomen outlining the liver, pancreas, spleen and along the pericolic gutters. Musculoskeletal: Degenerative disc disease L5-S1 with slight retrolisthesis grade 1 of L5 on S1. No acute nor suspicious osseous lesions. Review of the MIP images confirms the above findings. IMPRESSION: 1. Stable cardiomegaly with changes suggesting right heart failure  with reflux of contrast into the IVC and right hepatic veins with trace right effusion. 2. Coronary arteriosclerosis and aortic atherosclerosis. No aneurysm or dissection. No acute pulmonary embolus. 3. Slightly nodular appearance of the liver surface which may reflect changes of cirrhosis. Small volume of ascites in the abdomen. 4. Mild reactive adenopathy suspected of the mediastinal lymph nodes with prevascular and right paratracheal lymph nodes measuring up to 14 mm short axis. Mild enlargement of the inguinal lymph nodes measuring up to 8 mm short axis. 5. Sympathetic thickening of the gallbladder wall likely secondary to adjacent ascites and cholelithiasis. Findings are similar to that seen previously. 6. Simple and complex renal cysts with cortical scarring of the kidneys. No dominant mass noted. 7. Colonic diverticulosis without acute diverticulitis. Electronically Signed   By: Ashley Royalty M.D.    On: 05/18/2017 22:37   US Abdomen Limited Ruq  Result Date: 05/19/2017 CLINICAL DATA:  Right upper quadrant pain EXAM: ULTRASOUND ABDOMEN LIMITED RIGHT UPPER QUADRANT COMPARISON:  05/18/2017 FINDINGS: Gallbladder: Small gallstones, the largest 7 mm. Gallbladder wall thickening measuring 7 mm. Negative sonographic Murphy's. Common bile duct: Diameter: Normal caliber, 3 mm Liver: No focal lesion identified. Within normal limits in parenchymal echogenicity. Incidentally noted is small amount of perihepatic ascites and right effusion. IMPRESSION: Few small layering gallstones. Gallbladder wall thickening without sonographic Murphy's sign. Findings may reflect chronic cholecystitis. Recommend clinical correlation to exclude acute cholecystitis. If felt clinically indicated, nuclear medicine hepatobiliary scan may be helpful. Small amount of perihepatic ascites.  Right pleural effusion. Electronically Signed   By: Rolm Baptise M.D.   On: 05/19/2017 07:38        Scheduled Meds: . calcium acetate  2,001 mg Oral TID WC  . carvedilol  25 mg Oral BID WC  . enoxaparin (LOVENOX) injection  30 mg Subcutaneous QHS  . folic acid  1 mg Oral Daily  . guaiFENesin  600 mg Oral BID  . isosorbide mononitrate  60 mg Oral Daily  . multivitamin with minerals  1 tablet Oral Daily  . sodium chloride flush  3 mL Intravenous Q12H  . sodium chloride flush  3 mL Intravenous Q12H  . thiamine  100 mg Oral Daily   Or  . thiamine  100 mg Intravenous Daily   Continuous Infusions: . sodium chloride       LOS: 0 days    Time spent: 25 min    [place], DO [facility] Pager [phone]  If 7PM-7AM, please contact night-coverage [email or web address] Password [email or web address] 05/19/2017, 10:44 AM

## 2017-05-19 NOTE — Consult Note (Signed)
[facility] Surgery Consult/Admission Note  [name] [name] 01-Apr-1966  [id number].    Requesting MD: Dr. Eliseo Squires Chief Complaint/Reason for Consult: RUQ abdominal pain  HPI:    Patient is a 51 year old male with a history of ESRD on HD, chronic systolic and diastolic heart failure, polysubstance abuse (alcohol and cocaine), secondary hypoparathyroidism who presented to the [facility] ED with complaints of shortness of breath yesterday (6/4). Patient states he had a similar episode of shortness of breath and abdominal pain roughly one year ago. He states roughly 4 days ago he noted shortness of breath walking up his driveway. Shortly after he began having pain in his right upper quadrant that radiated to the left side. This pain waxes and wanes in severity but is constant. Pain is currently mild. Patient states he had abdominal bloating 4 days ago but this has resolved. Patient denies fever, chills, nausea, vomiting, diarrhea, changes in bowel habits, blood in his stool.  ED course: Vital signs stable, chloride 93, glucose 138, BUN 30, creatinine 8.17, calcium 7.8, magnesium 2.8, AST 69, all T1 20, troponin 0.29, lactic acid 2.4, hemoglobin 12.2, TSH 4.659 CT abdomen pelvis: Small-volume ascites, slightly nodular appearance of liver service may reflect changes of cirrhosis, thickening of the gallbladder wall, cholelithiasis US abdomen: Diffuse mild ankle stones, bladder wall thickening without sonographic Murphy sign. Findings may reflect chronic cholecystitis. Small amount of perihepatic ascites  ROS:  Review of Systems  Constitutional: Positive for malaise/fatigue. Negative for chills and fever.  HENT: Negative for sore throat.   Respiratory: Positive for shortness of breath. Negative for cough.   Cardiovascular: Negative for chest pain.  Gastrointestinal: Positive for abdominal pain. Negative for blood in stool, constipation, diarrhea, nausea and vomiting.  Genitourinary: Negative for dysuria and  hematuria.  Skin: Negative for rash.  Neurological: Negative for dizziness, loss of consciousness and headaches.  All other systems reviewed and are negative.    Family History  Problem Relation Age of Onset  . Hypertension Mother   . Diabetes Mother   . Hypertension Father     Past Medical History:  Diagnosis Date  . CHF (congestive heart failure) ([place])   . Combined congestive systolic and diastolic heart failure ([place]) 04/18/2014   A. Echo 8/13: Severe LVH, EF 40-45%, inferoposterior akinesis, grade 2 diastolic dysfunction, moderate LAE, mild RVE, mildly reduced RVSF, mild RAE; cannot rule out R atrial mass-suggest TEE or cardiac MRI  //  B. Echo 3/17: Mild LVH, EF 25-30%, diffuse HK, grade 2 diastolic dysfunction, mild MR, severe LAE,  moderately reduced RVSF, severe RAE, mild TR, moderate PI, PASP 55 mmHg    . ESRD (end stage renal disease) on dialysis [facility])    MWF- [place] (05/18/2017)  . Hemodialysis patient ([place])    M,W,F  . Hypertension   . Medical non-compliance   . Non-ischemic cardiomyopathy ([place])    A. R/L HC 3/17: Normal coronary arteries, moderate pulmonary hypertension (PASP 65 mmHg), elevated LV filling pressures (LVEDP 45 mmHg)   . NSVT (nonsustained ventricular tachycardia) ([place]) 02/22/2016  . Polysubstance abuse   . Restless leg syndrome     Past Surgical History:  Procedure Laterality Date  . [place] TRANSPOSITION  01/19/2013   Procedure: BASCILIC VEIN TRANSPOSITION;  Surgeon: Conrad White Stone, MD;  Location: [place];  Service: Vascular;  Laterality: Left;  left 2nd stage basilic vein transposition  . CARDIAC CATHETERIZATION N/A 02/25/2016   Procedure: Right/Left Heart Cath and Coronary Angiography;  Surgeon: Burnell Blanks, MD;  Location:  [place] INVASIVE CV LAB;  Service: Cardiovascular;  Laterality: N/A;  . INGUINAL HERNIA REPAIR Right 08/05/2016   Procedure: RIGHT INGUINAL HERNIA REPAIR WITH MESH;  Surgeon: Donnie Mesa, MD;  Location: [place];  Service:  General;  Laterality: Right;  . INSERTION OF DIALYSIS CATHETER  07/17/2012   Procedure: INSERTION OF DIALYSIS CATHETER;  Surgeon: Conrad Hungerford, MD;  Location: [place];  Service: Vascular;  Laterality: Right;  right internal jugular  . INSERTION OF MESH Right 08/05/2016   Procedure: INSERTION OF MESH;  Surgeon: Donnie Mesa, MD;  Location: [place];  Service: General;  Laterality: Right;  . REVISON OF ARTERIOVENOUS FISTULA Left 01/06/2017   Procedure: REVISON OF ARTERIOVENOUS FISTULA;  Surgeon: Conrad Vredenburgh, MD;  Location: [place];  Service: Vascular;  Laterality: Left;  . UMBILICAL HERNIA REPAIR N/A 08/05/2016   Procedure: [place];  Surgeon: Donnie Mesa, MD;  Location: [place];  Service: General;  Laterality: N/A;  . VENOGRAM N/A 08/09/2012   Procedure: VENOGRAM;  Surgeon: Conrad Hearne, MD;  Location: [facility] CATH LAB;  Service: Cardiovascular;  Laterality: N/A;    Social History:  reports that he has been smoking Cigarettes.  He has a 3.00 pack-year smoking history. He has never used smokeless tobacco. He reports that he drinks about 3.6 oz of alcohol per week . He reports that he does not use drugs.  Allergies:  Allergies  Allergen Reactions  . Losartan Cough    Medications Prior to Admission  Medication Sig Dispense Refill  . acetaminophen (TYLENOL) 325 MG tablet Take 650 mg by mouth every 6 (six) hours as needed for headache (pain).    . calcium acetate (PHOSLO) 667 MG capsule Take 3 capsules (2,001 mg total) by mouth 3 (three) times daily with meals. (Patient taking differently: Take 1,334-3,335 mg by mouth See admin instructions. Take 3-5 capsules (2001 - 3335 mg) by mouth 3 times daily with large meals, take 2 capsules (1334 mg) with snacks) 90 capsule 0  . carvedilol (COREG) 25 MG tablet Take 25 mg by mouth 2 (two) times daily.    . isosorbide mononitrate (IMDUR) 60 MG 24 hr tablet Take 60 mg by mouth daily.    . Multiple Vitamin (MULTIVITAMIN WITH MINERALS) TABS tablet  Take 1 tablet by mouth daily.      Blood pressure 111/86, pulse 62, temperature 97.7 F (36.5 C), temperature source Oral, resp. rate 18, height '5\' 9"'$  (1.753 m), weight 166 lb (75.3 kg), SpO2 97 %.  Physical Exam  Constitutional: He is oriented to person, place, and time and well-developed, well-nourished, and in no distress. No distress.  HENT:  Head: Normocephalic and atraumatic.  Nose: Nose normal.  Mouth/Throat: Oropharynx is clear and moist. No oropharyngeal exudate.  Eyes: Right eye exhibits no discharge. Left eye exhibits no discharge.  Pupils equal  Neck: Normal range of motion. Neck supple. No thyromegaly present.  Cardiovascular: Normal rate, regular rhythm and normal heart sounds.  Exam reveals no gallop and no friction rub.   No murmur heard. Pulses:      Radial pulses are 2+ on the right side, and 2+ on the left side.  Pulmonary/Chest: Effort normal and breath sounds normal. He has no decreased breath sounds. He has no wheezes.  Abdominal: Soft. Normal appearance and bowel sounds are normal. He exhibits no distension. There is hepatomegaly. There is tenderness in the right upper quadrant.  Musculoskeletal: Normal range of motion. He exhibits no tenderness or deformity.  Neurological:  He is alert and oriented to person, place, and time.  Skin: Skin is warm and dry. He is not diaphoretic.  Psychiatric: Mood and affect normal.  Nursing note and vitals reviewed.   Results for orders placed or performed during the hospital encounter of 05/18/17 (from the past 48 hour(s))  Basic metabolic panel     Status: Abnormal   Collection Time: 05/18/17  4:30 PM  Result Value Ref Range   Sodium 139 135 - 145 mmol/L   Potassium 3.5 3.5 - 5.1 mmol/L   Chloride 93 (L) 101 - 111 mmol/L   CO2 31 22 - 32 mmol/L   Glucose, Bld 138 (H) 65 - 99 mg/dL   BUN 30 (H) 6 - 20 mg/dL   Creatinine, Ser 8.17 (H) 0.61 - 1.24 mg/dL   Calcium 7.8 (L) 8.9 - 10.3 mg/dL   GFR calc non Af Amer 7 (L) >60  mL/min   GFR calc Af Amer 8 (L) >60 mL/min    Comment: (NOTE) The eGFR has been calculated using the CKD EPI equation. This calculation has not been validated in all clinical situations. eGFR's persistently <60 mL/min signify possible Chronic Kidney Disease.    Anion gap 15 5 - 15  CBC     Status: Abnormal   Collection Time: 05/18/17  4:30 PM  Result Value Ref Range   WBC 4.5 4.0 - 10.5 K/uL   RBC 3.97 (L) 4.22 - 5.81 MIL/uL   Hemoglobin 12.2 (L) 13.0 - 17.0 g/dL   HCT 36.7 (L) 39.0 - 52.0 %   MCV 92.4 78.0 - 100.0 fL   MCH 30.7 26.0 - 34.0 pg   MCHC 33.2 30.0 - 36.0 g/dL   RDW 15.4 11.5 - 15.5 %   Platelets 162 150 - 400 K/uL  I-stat troponin, ED     Status: Abnormal   Collection Time: 05/18/17  4:54 PM  Result Value Ref Range   Troponin i, poc 0.24 (HH) 0.00 - 0.08 ng/mL   Comment NOTIFIED PHYSICIAN    Comment 3            Comment: Due to the release kinetics of cTnI, a negative result within the first hours of the onset of symptoms does not rule out myocardial infarction with certainty. If myocardial infarction is still suspected, repeat the test at appropriate intervals.   Magnesium     Status: Abnormal   Collection Time: 05/18/17  7:47 PM  Result Value Ref Range   Magnesium 2.8 (H) 1.7 - 2.4 mg/dL  Troponin I (q 6hr x 3)     Status: Abnormal   Collection Time: 05/18/17  7:47 PM  Result Value Ref Range   Troponin I 0.29 (HH) <0.03 ng/mL    Comment: CRITICAL RESULT CALLED TO, READ BACK BY AND VERIFIED WITH: GUIJOZA,A RN 05/18/2017 2044 JORDANS   Hepatic function panel     Status: Abnormal   Collection Time: 05/18/17  7:47 PM  Result Value Ref Range   Total Protein 6.7 6.5 - 8.1 g/dL   Albumin 3.4 (L) 3.5 - 5.0 g/dL   AST 69 (H) 15 - 41 U/L   ALT 120 (H) 17 - 63 U/L   Alkaline Phosphatase 68 38 - 126 U/L   Total Bilirubin 0.9 0.3 - 1.2 mg/dL   Bilirubin, Direct 0.1 0.1 - 0.5 mg/dL   Indirect Bilirubin 0.8 0.3 - 0.9 mg/dL  Lipase, blood     Status: None    Collection Time: 05/18/17  7:47 PM  Result Value Ref Range   Lipase 24 11 - 51 U/L  Lactic acid, plasma     Status: Abnormal   Collection Time: 05/18/17  9:15 PM  Result Value Ref Range   Lactic Acid, Venous 2.4 (HH) 0.5 - 1.9 mmol/L    Comment: CRITICAL RESULT CALLED TO, READ BACK BY AND VERIFIED WITH: S.MIZE,RN 2208 05/18/17 [id number]   MRSA PCR Screening     Status: None   Collection Time: 05/18/17 11:27 PM  Result Value Ref Range   MRSA by PCR NEGATIVE NEGATIVE    Comment:        The GeneXpert MRSA Assay (FDA approved for NASAL specimens only), is one component of a comprehensive MRSA colonization surveillance program. It is not intended to diagnose MRSA infection nor to guide or monitor treatment for MRSA infections.   Troponin I (q 6hr x 3)     Status: Abnormal   Collection Time: 05/19/17  1:54 AM  Result Value Ref Range   Troponin I 0.28 (HH) <0.03 ng/mL    Comment: CRITICAL VALUE NOTED.  VALUE IS CONSISTENT WITH PREVIOUSLY REPORTED AND CALLED VALUE.  TSH     Status: Abnormal   Collection Time: 05/19/17  1:54 AM  Result Value Ref Range   TSH 4.659 (H) 0.350 - 4.500 uIU/mL    Comment: Performed by a 3rd Generation assay with a functional sensitivity of <=0.01 uIU/mL.  Lactic acid, plasma     Status: Abnormal   Collection Time: 05/19/17  1:54 AM  Result Value Ref Range   Lactic Acid, Venous 2.3 (HH) 0.5 - 1.9 mmol/L    Comment: CRITICAL RESULT CALLED TO, READ BACK BY AND VERIFIED WITH: DUVALL,D RN 05/19/2017 0302 JORDANS   Troponin I (q 6hr x 3)     Status: Abnormal   Collection Time: 05/19/17  6:59 AM  Result Value Ref Range   Troponin I 0.26 (HH) <0.03 ng/mL    Comment: CRITICAL VALUE NOTED.  VALUE IS CONSISTENT WITH PREVIOUSLY REPORTED AND CALLED VALUE.  Magnesium     Status: Abnormal   Collection Time: 05/19/17  6:59 AM  Result Value Ref Range   Magnesium 2.9 (H) 1.7 - 2.4 mg/dL  Phosphorus     Status: Abnormal   Collection Time: 05/19/17  6:59 AM  Result  Value Ref Range   Phosphorus 6.2 (H) 2.5 - 4.6 mg/dL  Comprehensive metabolic panel     Status: Abnormal   Collection Time: 05/19/17  6:59 AM  Result Value Ref Range   Sodium 136 135 - 145 mmol/L   Potassium 4.0 3.5 - 5.1 mmol/L   Chloride 93 (L) 101 - 111 mmol/L   CO2 26 22 - 32 mmol/L   Glucose, Bld 90 65 - 99 mg/dL   BUN 39 (H) 6 - 20 mg/dL   Creatinine, Ser 9.34 (H) 0.61 - 1.24 mg/dL   Calcium 7.4 (L) 8.9 - 10.3 mg/dL   Total Protein 6.5 6.5 - 8.1 g/dL   Albumin 3.2 (L) 3.5 - 5.0 g/dL   AST 77 (H) 15 - 41 U/L   ALT 124 (H) 17 - 63 U/L   Alkaline Phosphatase 59 38 - 126 U/L   Total Bilirubin 1.2 0.3 - 1.2 mg/dL   GFR calc non Af Amer 6 (L) >60 mL/min   GFR calc Af Amer 7 (L) >60 mL/min    Comment: (NOTE) The eGFR has been calculated using the CKD EPI equation. This calculation has not been validated in all clinical situations.  eGFR's persistently <60 mL/min signify possible Chronic Kidney Disease.    Anion gap 17 (H) 5 - 15  CBC     Status: Abnormal   Collection Time: 05/19/17  6:59 AM  Result Value Ref Range   WBC 4.9 4.0 - 10.5 K/uL   RBC 3.94 (L) 4.22 - 5.81 MIL/uL   Hemoglobin 12.1 (L) 13.0 - 17.0 g/dL   HCT 36.6 (L) 39.0 - 52.0 %   MCV 92.9 78.0 - 100.0 fL   MCH 30.7 26.0 - 34.0 pg   MCHC 33.1 30.0 - 36.0 g/dL   RDW 15.7 (H) 11.5 - 15.5 %   Platelets 150 150 - 400 K/uL  Protime-INR     Status: Abnormal   Collection Time: 05/19/17  6:59 AM  Result Value Ref Range   Prothrombin Time 17.3 (H) 11.4 - 15.2 seconds   INR 1.40   Lipid panel     Status: Abnormal   Collection Time: 05/19/17  6:59 AM  Result Value Ref Range   Cholesterol 62 0 - 200 mg/dL   Triglycerides 56 <150 mg/dL   HDL 29 (L) >40 mg/dL   Total CHOL/HDL Ratio 2.1 RATIO   VLDL 11 0 - 40 mg/dL   LDL Cholesterol 22 0 - 99 mg/dL    Comment:        Total Cholesterol/HDL:CHD Risk Coronary Heart Disease Risk Table                     Men   Women  1/2 Average Risk   3.4   3.3  Average Risk        5.0   4.4  2 X Average Risk   9.6   7.1  3 X Average Risk  23.4   11.0        Use the calculated Patient Ratio above and the CHD Risk Table to determine the patient's CHD Risk.        ATP III CLASSIFICATION (LDL):  <100     mg/dL   Optimal  100-129  mg/dL   Near or Above                    Optimal  130-159  mg/dL   Borderline  160-189  mg/dL   High  >190     mg/dL   Very High    Dg Chest 2 View  Result Date: 05/18/2017 CLINICAL DATA:  Shortness of breath EXAM: CHEST  2 VIEW COMPARISON:  02/20/2016 FINDINGS: Mild cardiomegaly. No edema or pleural effusion. No pneumothorax. Probable old right eighth and ninth rib fractures. IMPRESSION: Mild cardiomegaly.  No edema or infiltrate. Electronically Signed   By: Donavan Foil M.D.   On: 05/18/2017 17:37   Ct Angio Chest Pe W Or Wo Contrast  Result Date: 05/18/2017 CLINICAL DATA:  Chest pain times 3-4 days with pain beneath right lower ribs. End-stage renal disease on dialysis greater than 5 years. Last dialysis was today. History of right inguinal hernia repair and umbilical hernia repair. EXAM: CT ANGIOGRAPHY CHEST CT ABDOMEN AND PELVIS WITH CONTRAST TECHNIQUE: Multidetector CT imaging of the chest was performed using the standard protocol during bolus administration of intravenous contrast. Multiplanar CT image reconstructions and MIPs were obtained to evaluate the vascular anatomy. Multidetector CT imaging of the abdomen and pelvis was performed using the standard protocol during bolus administration of intravenous contrast. CONTRAST:  100 cc Isovue 370 IV. COMPARISON:  02/21/2016 CT, 03/08/2014 CT FINDINGS: CTA CHEST  FINDINGS Cardiovascular: Cardiomegaly. Reflux of contrast into the IVC and hepatic veins consistent with right heart failure. No pericardial effusion. Aortic atherosclerosis without aneurysm. No large central pulmonary embolus. Coronary arteriosclerosis is identified along the circumflex. Mediastinum/Nodes: Mild mediastinal  lymphadenopathy measuring up to 14 mm in the right lower paratracheal and prevascular portions of the mediastinum possibly reactive in etiology. No hilar lymphadenopathy. Trachea, esophagus and thyroid gland are unremarkable. Lungs/Pleura: Small right pleural effusion. No pneumonic consolidation. No dominant mass. Musculoskeletal: Chronic right lower rib fractures. No acute osseous abnormality. Osteoarthritis of the right glenohumeral joint with subcortical and subchondral cystic change. Review of the MIP images confirms the above findings. CT ABDOMEN and PELVIS FINDINGS Hepatobiliary: Cholelithiasis with slight thickened appearance of the gallbladder some which may be due to sympathetic thickening from small volume of ascites noted in the abdomen. Slightly nodular appearance of the liver surface may represent changes of cirrhosis. No space-occupying mass. Pancreas: No pancreatic ductal dilatation or mass. Spleen: No splenomegaly or mass. Adrenals/Urinary Tract: Several bilateral simple and complex renal cysts are noted some which demonstrate milk-of-calcium for example in the left upper pole, series 14, image 34 and others which are hyperdense, example 9 mm in diameter in the right upper pole image 29, too small to further characterize but similar in size dating back to 2015 and are likely of benign etiology. No obstructive uropathy. Renovascular calcifications are noted bilaterally. No obstructive uropathy. The urinary bladder is contracted. Renal cortical scarring is seen bilaterally. No adrenal mass is identified. Stomach/Bowel: Contracted stomach with normal small bowel rotation. No bowel obstruction or inflammation. Normal appendix. Scattered colonic diverticulosis is identified without acute diverticulitis. Vascular/Lymphatic: No aortic aneurysm or dissection. No lymphadenopathy. A few mildly enlarged inguinal lymph nodes are seen bilaterally which may be reactive in etiology the largest approximately mm  short axis. Reproductive: Mildly enlarged prostate gland measuring 4.6 x 3.7 cm in transverse by AP dimension. Unremarkable seminal vesicles. Other: Small volume of ascites is noted within the abdomen outlining the liver, pancreas, spleen and along the pericolic gutters. Musculoskeletal: Degenerative disc disease L5-S1 with slight retrolisthesis grade 1 of L5 on S1. No acute nor suspicious osseous lesions. Review of the MIP images confirms the above findings. IMPRESSION: 1. Stable cardiomegaly with changes suggesting right heart failure with reflux of contrast into the IVC and right hepatic veins with trace right effusion. 2. Coronary arteriosclerosis and aortic atherosclerosis. No aneurysm or dissection. No acute pulmonary embolus. 3. Slightly nodular appearance of the liver surface which may reflect changes of cirrhosis. Small volume of ascites in the abdomen. 4. Mild reactive adenopathy suspected of the mediastinal lymph nodes with prevascular and right paratracheal lymph nodes measuring up to 14 mm short axis. Mild enlargement of the inguinal lymph nodes measuring up to 8 mm short axis. 5. Sympathetic thickening of the gallbladder wall likely secondary to adjacent ascites and cholelithiasis. Findings are similar to that seen previously. 6. Simple and complex renal cysts with cortical scarring of the kidneys. No dominant mass noted. 7. Colonic diverticulosis without acute diverticulitis. Electronically Signed   By: Ashley Royalty M.D.   On: 05/18/2017 22:37   Ct Abdomen Pelvis W Contrast  Result Date: 05/18/2017 CLINICAL DATA:  Chest pain times 3-4 days with pain beneath right lower ribs. End-stage renal disease on dialysis greater than 5 years. Last dialysis was today. History of right inguinal hernia repair and umbilical hernia repair. EXAM: CT ANGIOGRAPHY CHEST CT ABDOMEN AND PELVIS WITH CONTRAST TECHNIQUE: Multidetector CT imaging of the  chest was performed using the standard protocol during bolus  administration of intravenous contrast. Multiplanar CT image reconstructions and MIPs were obtained to evaluate the vascular anatomy. Multidetector CT imaging of the abdomen and pelvis was performed using the standard protocol during bolus administration of intravenous contrast. CONTRAST:  100 cc Isovue 370 IV. COMPARISON:  02/21/2016 CT, 03/08/2014 CT FINDINGS: CTA CHEST FINDINGS Cardiovascular: Cardiomegaly. Reflux of contrast into the IVC and hepatic veins consistent with right heart failure. No pericardial effusion. Aortic atherosclerosis without aneurysm. No large central pulmonary embolus. Coronary arteriosclerosis is identified along the circumflex. Mediastinum/Nodes: Mild mediastinal lymphadenopathy measuring up to 14 mm in the right lower paratracheal and prevascular portions of the mediastinum possibly reactive in etiology. No hilar lymphadenopathy. Trachea, esophagus and thyroid gland are unremarkable. Lungs/Pleura: Small right pleural effusion. No pneumonic consolidation. No dominant mass. Musculoskeletal: Chronic right lower rib fractures. No acute osseous abnormality. Osteoarthritis of the right glenohumeral joint with subcortical and subchondral cystic change. Review of the MIP images confirms the above findings. CT ABDOMEN and PELVIS FINDINGS Hepatobiliary: Cholelithiasis with slight thickened appearance of the gallbladder some which may be due to sympathetic thickening from small volume of ascites noted in the abdomen. Slightly nodular appearance of the liver surface may represent changes of cirrhosis. No space-occupying mass. Pancreas: No pancreatic ductal dilatation or mass. Spleen: No splenomegaly or mass. Adrenals/Urinary Tract: Several bilateral simple and complex renal cysts are noted some which demonstrate milk-of-calcium for example in the left upper pole, series 14, image 34 and others which are hyperdense, example 9 mm in diameter in the right upper pole image 29, too small to further  characterize but similar in size dating back to 2015 and are likely of benign etiology. No obstructive uropathy. Renovascular calcifications are noted bilaterally. No obstructive uropathy. The urinary bladder is contracted. Renal cortical scarring is seen bilaterally. No adrenal mass is identified. Stomach/Bowel: Contracted stomach with normal small bowel rotation. No bowel obstruction or inflammation. Normal appendix. Scattered colonic diverticulosis is identified without acute diverticulitis. Vascular/Lymphatic: No aortic aneurysm or dissection. No lymphadenopathy. A few mildly enlarged inguinal lymph nodes are seen bilaterally which may be reactive in etiology the largest approximately mm short axis. Reproductive: Mildly enlarged prostate gland measuring 4.6 x 3.7 cm in transverse by AP dimension. Unremarkable seminal vesicles. Other: Small volume of ascites is noted within the abdomen outlining the liver, pancreas, spleen and along the pericolic gutters. Musculoskeletal: Degenerative disc disease L5-S1 with slight retrolisthesis grade 1 of L5 on S1. No acute nor suspicious osseous lesions. Review of the MIP images confirms the above findings. IMPRESSION: 1. Stable cardiomegaly with changes suggesting right heart failure with reflux of contrast into the IVC and right hepatic veins with trace right effusion. 2. Coronary arteriosclerosis and aortic atherosclerosis. No aneurysm or dissection. No acute pulmonary embolus. 3. Slightly nodular appearance of the liver surface which may reflect changes of cirrhosis. Small volume of ascites in the abdomen. 4. Mild reactive adenopathy suspected of the mediastinal lymph nodes with prevascular and right paratracheal lymph nodes measuring up to 14 mm short axis. Mild enlargement of the inguinal lymph nodes measuring up to 8 mm short axis. 5. Sympathetic thickening of the gallbladder wall likely secondary to adjacent ascites and cholelithiasis. Findings are similar to that seen  previously. 6. Simple and complex renal cysts with cortical scarring of the kidneys. No dominant mass noted. 7. Colonic diverticulosis without acute diverticulitis. Electronically Signed   By: Ashley Royalty M.D.   On: 05/18/2017 22:37  US Abdomen Limited Ruq  Result Date: 05/19/2017 CLINICAL DATA:  Right upper quadrant pain EXAM: ULTRASOUND ABDOMEN LIMITED RIGHT UPPER QUADRANT COMPARISON:  05/18/2017 FINDINGS: Gallbladder: Small gallstones, the largest 7 mm. Gallbladder wall thickening measuring 7 mm. Negative sonographic Murphy's. Common bile duct: Diameter: Normal caliber, 3 mm Liver: No focal lesion identified. Within normal limits in parenchymal echogenicity. Incidentally noted is small amount of perihepatic ascites and right effusion. IMPRESSION: Few small layering gallstones. Gallbladder wall thickening without sonographic Murphy's sign. Findings may reflect chronic cholecystitis. Recommend clinical correlation to exclude acute cholecystitis. If felt clinically indicated, nuclear medicine hepatobiliary scan may be helpful. Small amount of perihepatic ascites.  Right pleural effusion. Electronically Signed   By: Rolm Baptise M.D.   On: 05/19/2017 07:38      Assessment/Plan  CHF ESRD - HD M/W/F Polysubstance abuse  Right upper quadrant abdominal pain - CT scan and ultrasound concerning for chronic cholecystitis - HIDA scan pending - Echo pending  Patient is not an ideal surgical candidate. We'll await results of HIDA scan. We will continue to follow this patient. Thank you for the consult.  Kalman Drape, [facility] Surgery 05/19/2017, 1:51 PM Pager: [phone] Consults: [phone] Mon-Fri 7:00 am-4:30 pm Sat-Sun 7:00 am-11:30 am

## 2017-05-19 NOTE — Consult Note (Signed)
Cardiology Consult    Patient ID: [name] [name] MRN: [id number], DOB/AGE: [id number]   Admit date: 05/18/2017 Date of Consult: 05/19/2017  Primary Physician: [facility] Primary Cardiologist: Dr. Radford Pax Requesting Provider: Dr. Eliseo Squires  Reason for Consult: acute on chronic combined heart failure  Patient Profile    [name] [name] has a PMH significant for ESRD on HD, polysubstance abuse (cocaine, ETOH), chronic systolic and diastolic heart failure, secondary hyperparathyroidism, VT in the setting of cocaine and HTN urgerncy (2011), and medical non-compliance. He presented to [facility] with shortness of breath and dyspnea on exertion.   [name] [name] is a 51 y.o. male who is being seen today for the evaluation of CHF exacerbation at the request of Dr. Eliseo Squires.   Past Medical History   Past Medical History:  Diagnosis Date  . CHF (congestive heart failure) ([place])   . Combined congestive systolic and diastolic heart failure ([place]) 04/18/2014   A. Echo 8/13: Severe LVH, EF 40-45%, inferoposterior akinesis, grade 2 diastolic dysfunction, moderate LAE, mild RVE, mildly reduced RVSF, mild RAE; cannot rule out R atrial mass-suggest TEE or cardiac MRI  //  B. Echo 3/17: Mild LVH, EF 25-30%, diffuse HK, grade 2 diastolic dysfunction, mild MR, severe LAE,  moderately reduced RVSF, severe RAE, mild TR, moderate PI, PASP 55 mmHg    . ESRD (end stage renal disease) on dialysis [facility])    MWF- [place] (05/18/2017)  . Hemodialysis patient ([place])    M,W,F  . Hypertension   . Medical non-compliance   . Non-ischemic cardiomyopathy ([place])    A. R/L HC 3/17: Normal coronary arteries, moderate pulmonary hypertension (PASP 65 mmHg), elevated LV filling pressures (LVEDP 45 mmHg)   . NSVT (nonsustained ventricular tachycardia) ([place]) 02/22/2016  . Polysubstance abuse   . Restless leg syndrome     Past Surgical History:  Procedure Laterality Date  . [place] TRANSPOSITION  01/19/2013   Procedure: BASCILIC VEIN  TRANSPOSITION;  Surgeon: Conrad Sharon, MD;  Location: [place];  Service: Vascular;  Laterality: Left;  left 2nd stage basilic vein transposition  . CARDIAC CATHETERIZATION N/A 02/25/2016   Procedure: Right/Left Heart Cath and Coronary Angiography;  Surgeon: Burnell Blanks, MD;  Location: [place] CV LAB;  Service: Cardiovascular;  Laterality: N/A;  . INGUINAL HERNIA REPAIR Right 08/05/2016   Procedure: RIGHT INGUINAL HERNIA REPAIR WITH MESH;  Surgeon: Donnie Mesa, MD;  Location: [place];  Service: General;  Laterality: Right;  . INSERTION OF DIALYSIS CATHETER  07/17/2012   Procedure: INSERTION OF DIALYSIS CATHETER;  Surgeon: Conrad Noble, MD;  Location: [place];  Service: Vascular;  Laterality: Right;  right internal jugular  . INSERTION OF MESH Right 08/05/2016   Procedure: INSERTION OF MESH;  Surgeon: Donnie Mesa, MD;  Location: [place];  Service: General;  Laterality: Right;  . REVISON OF ARTERIOVENOUS FISTULA Left 01/06/2017   Procedure: REVISON OF ARTERIOVENOUS FISTULA;  Surgeon: Conrad Surry, MD;  Location: [place];  Service: Vascular;  Laterality: Left;  . UMBILICAL HERNIA REPAIR N/A 08/05/2016   Procedure: [place];  Surgeon: Donnie Mesa, MD;  Location: [place];  Service: General;  Laterality: N/A;  . VENOGRAM N/A 08/09/2012   Procedure: VENOGRAM;  Surgeon: Conrad Livingston Wheeler, MD;  Location: [facility] C[facility] LAB;  Service: Cardiovascular;  Laterality: N/A;     Allergies  Allergies  Allergen Reactions  . Losartan Cough    History of Present Illness    [name] [name] is known to  our service and last saw [name] Dopp, PA-C, in clinic on 07/22/16 for surgical clearance for inguinal hernia repair. At that time, he was compliant on HD (MWF).    He states that he started to become short of breath on his daily walks about 4-5 days ago. He has an associated cough and RUQ abdominal pain. The dyspnea on exertion persisted and SOB started occurring at rest. No relieving or aggravating  factors. He denies chest pain, palpitations, dizziness, nausea, vomiting, recent illness, sick contacts, and feelings of syncope. He presented to [facility] because the SOB and DOE were not improving. He continues to have a nonproductive cough.  Inpatient Medications    . calcium acetate  2,001 mg Oral TID WC  . carvedilol  25 mg Oral BID WC  . enoxaparin (LOVENOX) injection  30 mg Subcutaneous QHS  . folic acid  1 mg Oral Daily  . guaiFENesin  600 mg Oral BID  . isosorbide mononitrate  60 mg Oral Daily  . multivitamin with minerals  1 tablet Oral Daily  . sodium chloride flush  3 mL Intravenous Q12H  . sodium chloride flush  3 mL Intravenous Q12H  . thiamine  100 mg Oral Daily   Or  . thiamine  100 mg Intravenous Daily     Outpatient Medications    Prior to Admission medications   Medication Sig Start Date End Date Taking? Authorizing Provider  acetaminophen (TYLENOL) 325 MG tablet Take 650 mg by mouth every 6 (six) hours as needed for headache (pain).   Yes [provider]  calcium acetate (PHOSLO) 667 MG capsule Take 3 capsules (2,001 mg total) by mouth 3 (three) times daily with meals. Patient taking differently: Take 1,334-3,335 mg by mouth See admin instructions. Take 3-5 capsules (2001 - 3335 mg) by mouth 3 times daily with large meals, take 2 capsules (1334 mg) with snacks 04/21/14  Yes Mikhail, Maryann, DO  carvedilol (COREG) 25 MG tablet Take 25 mg by mouth 2 (two) times daily. 07/10/16  Yes [provider]  isosorbide mononitrate (IMDUR) 60 MG 24 hr tablet Take 60 mg by mouth daily. 07/21/12  Yes Tat, Shanon Brow, MD  Multiple Vitamin (MULTIVITAMIN WITH MINERALS) TABS tablet Take 1 tablet by mouth daily.   Yes [provider]     Family History     Family History  Problem Relation Age of Onset  . Hypertension Mother   . Diabetes Mother   . Hypertension Father     Social History    Social History   Social History  . Marital status: Single     Spouse name: N/A  . Number of children: N/A  . Years of education: N/A   Occupational History  . Not on file.   Social History Main Topics  . Smoking status: Current Every Day Smoker    Packs/day: 0.10    Years: 30.00    Types: Cigarettes  . Smokeless tobacco: Never Used  . Alcohol use 3.6 oz/week    6 Cans of beer per week  . Drug use: No     Comment: 05/18/2017 "qd"  . Sexual activity: Not Currently   Other Topics Concern  . Not on file   Social History Narrative  . No narrative on file     Review of Systems    General:  No chills, fever, night sweats or weight changes.  Cardiovascular:  No chest pain, + dyspnea on exertion, no edema, orthopnea, palpitations, paroxysmal nocturnal dyspnea. Dermatological: No  rash, lesions/masses Respiratory: + cough, + dyspnea Urologic: No hematuria, dysuria Abdominal:   No nausea, vomiting, diarrhea, bright red blood per rectum, melena, or hematemesis Neurologic:  No visual changes, changes in mental status. All other systems reviewed and are otherwise negative except as noted above.  Physical Exam    Blood pressure 111/86, pulse 62, temperature 97.7 F (36.5 C), temperature source Oral, resp. rate 18, height 5\' 9"  (1.753 m), weight 166 lb (75.3 kg), SpO2 97 %.  General: Pleasant, NAD Psych: Normal affect. Neuro: Alert and oriented X 3. Moves all extremities spontaneously. HEENT: Normal  Neck: Supple without bruits, minimal JVD. Lungs:  Resp regular and unlabored, CTA. Heart: RRR no s3, s4, or murmurs. Abdomen: Soft, non-tender, non-distended, BS + x 4.  Extremities: No clubbing, cyanosis or edema. DP/PT/Radials 2+ and equal bilaterally.  Labs    Troponin [facility] Test)  Recent Labs  05/18/17 1654  TROPIPOC 0.24*    Recent Labs  05/18/17 1947 05/19/17 0154 05/19/17 0659  TROPONINI 0.29* 0.28* 0.26*   Lab Results  Component Value Date   WBC 4.9 05/19/2017   HGB 12.1 (L) 05/19/2017   HCT 36.6 (L) 05/19/2017     MCV 92.9 05/19/2017   PLT 150 05/19/2017    Recent Labs Lab 05/19/17 0659  NA 136  K 4.0  CL 93*  CO2 26  BUN 39*  CREATININE 9.34*  CALCIUM 7.4*  PROT 6.5  BILITOT 1.2  ALKPHOS 59  ALT 124*  AST 77*  GLUCOSE 90   Lab Results  Component Value Date   CHOL 62 05/19/2017   HDL 29 (L) 05/19/2017   LDLCALC 22 05/19/2017   TRIG 56 05/19/2017   No results found for: [facility]   Radiology Studies    Dg Chest 2 View  Result Date: 05/18/2017 CLINICAL DATA:  Shortness of breath EXAM: CHEST  2 VIEW COMPARISON:  02/20/2016 FINDINGS: Mild cardiomegaly. No edema or pleural effusion. No pneumothorax. Probable old right eighth and ninth rib fractures. IMPRESSION: Mild cardiomegaly.  No edema or infiltrate. Electronically Signed   By: Donavan Foil M.D.   On: 05/18/2017 17:37   Ct Angio Chest Pe W Or Wo Contrast  Result Date: 05/18/2017 CLINICAL DATA:  Chest pain times 3-4 days with pain beneath right lower ribs. End-stage renal disease on dialysis greater than 5 years. Last dialysis was today. History of right inguinal hernia repair and umbilical hernia repair. EXAM: CT ANGIOGRAPHY CHEST CT ABDOMEN AND PELVIS WITH CONTRAST TECHNIQUE: Multidetector CT imaging of the chest was performed using the standard protocol during bolus administration of intravenous contrast. Multiplanar CT image reconstructions and MIPs were obtained to evaluate the vascular anatomy. Multidetector CT imaging of the abdomen and pelvis was performed using the standard protocol during bolus administration of intravenous contrast. CONTRAST:  100 cc Isovue 370 IV. COMPARISON:  02/21/2016 CT, 03/08/2014 CT FINDINGS: CTA CHEST FINDINGS Cardiovascular: Cardiomegaly. Reflux of contrast into the IVC and hepatic veins consistent with right heart failure. No pericardial effusion. Aortic atherosclerosis without aneurysm. No large central pulmonary embolus. Coronary arteriosclerosis is identified along the circumflex. Mediastinum/Nodes:  Mild mediastinal lymphadenopathy measuring up to 14 mm in the right lower paratracheal and prevascular portions of the mediastinum possibly reactive in etiology. No hilar lymphadenopathy. Trachea, esophagus and thyroid gland are unremarkable. Lungs/Pleura: Small right pleural effusion. No pneumonic consolidation. No dominant mass. Musculoskeletal: Chronic right lower rib fractures. No acute osseous abnormality. Osteoarthritis of the right glenohumeral joint with subcortical and subchondral cystic change.  Review of the MIP images confirms the above findings. CT ABDOMEN and PELVIS FINDINGS Hepatobiliary: Cholelithiasis with slight thickened appearance of the gallbladder some which may be due to sympathetic thickening from small volume of ascites noted in the abdomen. Slightly nodular appearance of the liver surface may represent changes of cirrhosis. No space-occupying mass. Pancreas: No pancreatic ductal dilatation or mass. Spleen: No splenomegaly or mass. Adrenals/Urinary Tract: Several bilateral simple and complex renal cysts are noted some which demonstrate milk-of-calcium for example in the left upper pole, series 14, image 34 and others which are hyperdense, example 9 mm in diameter in the right upper pole image 29, too small to further characterize but similar in size dating back to 2015 and are likely of benign etiology. No obstructive uropathy. Renovascular calcifications are noted bilaterally. No obstructive uropathy. The urinary bladder is contracted. Renal cortical scarring is seen bilaterally. No adrenal mass is identified. Stomach/Bowel: Contracted stomach with normal small bowel rotation. No bowel obstruction or inflammation. Normal appendix. Scattered colonic diverticulosis is identified without acute diverticulitis. Vascular/Lymphatic: No aortic aneurysm or dissection. No lymphadenopathy. A few mildly enlarged inguinal lymph nodes are seen bilaterally which may be reactive in etiology the largest  approximately mm short axis. Reproductive: Mildly enlarged prostate gland measuring 4.6 x 3.7 cm in transverse by AP dimension. Unremarkable seminal vesicles. Other: Small volume of ascites is noted within the abdomen outlining the liver, pancreas, spleen and along the pericolic gutters. Musculoskeletal: Degenerative disc disease L5-S1 with slight retrolisthesis grade 1 of L5 on S1. No acute nor suspicious osseous lesions. Review of the MIP images confirms the above findings. IMPRESSION: 1. Stable cardiomegaly with changes suggesting right heart failure with reflux of contrast into the IVC and right hepatic veins with trace right effusion. 2. Coronary arteriosclerosis and aortic atherosclerosis. No aneurysm or dissection. No acute pulmonary embolus. 3. Slightly nodular appearance of the liver surface which may reflect changes of cirrhosis. Small volume of ascites in the abdomen. 4. Mild reactive adenopathy suspected of the mediastinal lymph nodes with prevascular and right paratracheal lymph nodes measuring up to 14 mm short axis. Mild enlargement of the inguinal lymph nodes measuring up to 8 mm short axis. 5. Sympathetic thickening of the gallbladder wall likely secondary to adjacent ascites and cholelithiasis. Findings are similar to that seen previously. 6. Simple and complex renal cysts with cortical scarring of the kidneys. No dominant mass noted. 7. Colonic diverticulosis without acute diverticulitis. Electronically Signed   By: Ashley Royalty M.D.   On: 05/18/2017 22:37   Ct Abdomen Pelvis W Contrast  Result Date: 05/18/2017 CLINICAL DATA:  Chest pain times 3-4 days with pain beneath right lower ribs. End-stage renal disease on dialysis greater than 5 years. Last dialysis was today. History of right inguinal hernia repair and umbilical hernia repair. EXAM: CT ANGIOGRAPHY CHEST CT ABDOMEN AND PELVIS WITH CONTRAST TECHNIQUE: Multidetector CT imaging of the chest was performed using the standard protocol during  bolus administration of intravenous contrast. Multiplanar CT image reconstructions and MIPs were obtained to evaluate the vascular anatomy. Multidetector CT imaging of the abdomen and pelvis was performed using the standard protocol during bolus administration of intravenous contrast. CONTRAST:  100 cc Isovue 370 IV. COMPARISON:  02/21/2016 CT, 03/08/2014 CT FINDINGS: CTA CHEST FINDINGS Cardiovascular: Cardiomegaly. Reflux of contrast into the IVC and hepatic veins consistent with right heart failure. No pericardial effusion. Aortic atherosclerosis without aneurysm. No large central pulmonary embolus. Coronary arteriosclerosis is identified along the circumflex. Mediastinum/Nodes: Mild mediastinal lymphadenopathy measuring  up to 14 mm in the right lower paratracheal and prevascular portions of the mediastinum possibly reactive in etiology. No hilar lymphadenopathy. Trachea, esophagus and thyroid gland are unremarkable. Lungs/Pleura: Small right pleural effusion. No pneumonic consolidation. No dominant mass. Musculoskeletal: Chronic right lower rib fractures. No acute osseous abnormality. Osteoarthritis of the right glenohumeral joint with subcortical and subchondral cystic change. Review of the MIP images confirms the above findings. CT ABDOMEN and PELVIS FINDINGS Hepatobiliary: Cholelithiasis with slight thickened appearance of the gallbladder some which may be due to sympathetic thickening from small volume of ascites noted in the abdomen. Slightly nodular appearance of the liver surface may represent changes of cirrhosis. No space-occupying mass. Pancreas: No pancreatic ductal dilatation or mass. Spleen: No splenomegaly or mass. Adrenals/Urinary Tract: Several bilateral simple and complex renal cysts are noted some which demonstrate milk-of-calcium for example in the left upper pole, series 14, image 34 and others which are hyperdense, example 9 mm in diameter in the right upper pole image 29, too small to  further characterize but similar in size dating back to 2015 and are likely of benign etiology. No obstructive uropathy. Renovascular calcifications are noted bilaterally. No obstructive uropathy. The urinary bladder is contracted. Renal cortical scarring is seen bilaterally. No adrenal mass is identified. Stomach/Bowel: Contracted stomach with normal small bowel rotation. No bowel obstruction or inflammation. Normal appendix. Scattered colonic diverticulosis is identified without acute diverticulitis. Vascular/Lymphatic: No aortic aneurysm or dissection. No lymphadenopathy. A few mildly enlarged inguinal lymph nodes are seen bilaterally which may be reactive in etiology the largest approximately mm short axis. Reproductive: Mildly enlarged prostate gland measuring 4.6 x 3.7 cm in transverse by AP dimension. Unremarkable seminal vesicles. Other: Small volume of ascites is noted within the abdomen outlining the liver, pancreas, spleen and along the pericolic gutters. Musculoskeletal: Degenerative disc disease L5-S1 with slight retrolisthesis grade 1 of L5 on S1. No acute nor suspicious osseous lesions. Review of the MIP images confirms the above findings. IMPRESSION: 1. Stable cardiomegaly with changes suggesting right heart failure with reflux of contrast into the IVC and right hepatic veins with trace right effusion. 2. Coronary arteriosclerosis and aortic atherosclerosis. No aneurysm or dissection. No acute pulmonary embolus. 3. Slightly nodular appearance of the liver surface which may reflect changes of cirrhosis. Small volume of ascites in the abdomen. 4. Mild reactive adenopathy suspected of the mediastinal lymph nodes with prevascular and right paratracheal lymph nodes measuring up to 14 mm short axis. Mild enlargement of the inguinal lymph nodes measuring up to 8 mm short axis. 5. Sympathetic thickening of the gallbladder wall likely secondary to adjacent ascites and cholelithiasis. Findings are similar to  that seen previously. 6. Simple and complex renal cysts with cortical scarring of the kidneys. No dominant mass noted. 7. Colonic diverticulosis without acute diverticulitis. Electronically Signed   By: Ashley Royalty M.D.   On: 05/18/2017 22:37   US Abdomen Limited Ruq  Result Date: 05/19/2017 CLINICAL DATA:  Right upper quadrant pain EXAM: ULTRASOUND ABDOMEN LIMITED RIGHT UPPER QUADRANT COMPARISON:  05/18/2017 FINDINGS: Gallbladder: Small gallstones, the largest 7 mm. Gallbladder wall thickening measuring 7 mm. Negative sonographic Murphy's. Common bile duct: Diameter: Normal caliber, 3 mm Liver: No focal lesion identified. Within normal limits in parenchymal echogenicity. Incidentally noted is small amount of perihepatic ascites and right effusion. IMPRESSION: Few small layering gallstones. Gallbladder wall thickening without sonographic Murphy's sign. Findings may reflect chronic cholecystitis. Recommend clinical correlation to exclude acute cholecystitis. If felt clinically indicated, nuclear medicine hepatobiliary  scan may be helpful. Small amount of perihepatic ascites.  Right pleural effusion. Electronically Signed   By: Rolm Baptise M.D.   On: 05/19/2017 07:38    ECG & Cardiac Imaging    EKG 05/19/17: sinus rhythm, nonspecific T wave changes   Echocardiogram 05/19/17: pending   Right and Left Heart Catheterization 02/25/16: 1. No angiographic evidence of CAD 2. Moderate pulmonary HTN 3. Elevated LV filling pressures.   Recommendations: Would consider aggressive volume removal with HD.    Echocardiogram 02/21/16: Study Conclusions - Left ventricle: The cavity size was normal. Wall thickness was   increased in a pattern of mild LVH. There was mild concentric   hypertrophy. Systolic function was severely reduced. The   estimated ejection fraction was in the range of 25% to 30%.   Diffuse hypokinesis. Features are consistent with a pseudonormal   left ventricular filling pattern, with  concomitant abnormal   relaxation and increased filling pressure (grade 2 diastolic   dysfunction). Doppler parameters are consistent with high   ventricular filling pressure. - Aortic valve: Transvalvular velocity was within the normal range.   There was no stenosis. There was no regurgitation. - Mitral valve: There was mild regurgitation. - Left atrium: The atrium was severely dilated. - Right ventricle: The cavity size was mildly dilated. Wall   thickness was normal. Systolic function was moderately reduced. - Right atrium: The atrium was severely dilated. - Atrial septum: No defect or patent foramen ovale was identified   by color flow Doppler. - Tricuspid valve: There was mild regurgitation. - Pulmonic valve: There was moderate regurgitation. - Pulmonary arteries: Systolic pressure was severely increased. PA   peak pressure: 55 mm Hg (S). - Inferior vena cava: The vessel was dilated. The respirophasic   diameter changes were blunted (< 50%), consistent with elevated   central venous pressure.  Assessment & Plan    1. Acute on chronic combined systolic and diastolic heart failure, NICM - volume management per HD (MWF) - he has been compliant with dialysis - NICM likely secondary to HTN and drug abuse - echo with LVEF of 25-30%; will repeat echo - if remains low, will discuss ICD with EP and modify medication regimen - prior consideration for ICD by EP during a 2017 hospitalization with NSVT was thought to be risky, medical management was recommended at that time - home medications include coreg and imdur; he did not restart losartan after last clinic visit - BNP not collected given his HD status - CT chest with cardiomegaly and signs of right heart failure; negative for PE - pt continues to complain of shortness of breath, although he is maintaining on room air; continues with nonproductive cough   2. HTN - continue medications as above, defer to nephrology   3.  Polysubstance abuse - UDS pending - may need to hold beta blocker pending cocaine positive on UDS - per patient, no cocaine in the past 90 days - continues to smoke cigarettes   4. RUQ abdominal pain - defer to GI/surgery   5. ESRD on HD - per nephrology - will defer to nephrology fluid management   Signed, Ledora Bottcher, PA-C 05/19/2017, 11:51 AM [phone]  History and all data above reviewed.  Patient examined.  I agree with the findings as above.  The patient presents with increased SOB.  He does not appear to have excess volume and in fact does not have this issue when he presents with dialysis.  He has been at dry  weight at the end of dialysis and does not typically have to have significant fluid "pulled" .  He presents with DOE walking less than 50 yards on level ground.  It turns out that he has been without his Coreg and Imdur for a week as he says the prescriptions were not called into the pharmacy.   I do note that he has had many no show and cancelled appts in the system including the last cardiology appt and he has not been seen by [place] since last year. The patient exam reveals COR:RRR  ,  Lungs: Clear  ,  Abd: Positive bowel sounds, no rebound no guarding, Ext No edema  .  All available labs, radiology testing, previous records reviewed. Agree with documented assessment and plan. ACUTE ON CHRONIC SYSTOLIC HF:  I think that he has low output symptoms likely secondary to being off of his meds and these have been restarted. I do not suspect that he has severe volume overload.  Volume is managed by dialysis.  I do not think that further ischemia work up is indicated.  I do not think that he would be a good ICD candidate but we can talk to him further about this and the risk of not following with appts.    Jeneen Rinks Arden Axon  6:50 PM  05/19/2017

## 2017-05-20 ENCOUNTER — Other Ambulatory Visit (HOSPITAL_COMMUNITY): Payer: Medicare Other

## 2017-05-20 LAB — CBC
HEMATOCRIT: 35.4 % — AB (ref 39.0–52.0)
Hemoglobin: 11.9 g/dL — ABNORMAL LOW (ref 13.0–17.0)
MCH: 30.8 pg (ref 26.0–34.0)
MCHC: 33.6 g/dL (ref 30.0–36.0)
MCV: 91.7 fL (ref 78.0–100.0)
Platelets: 147 10*3/uL — ABNORMAL LOW (ref 150–400)
RBC: 3.86 MIL/uL — AB (ref 4.22–5.81)
RDW: 16 % — ABNORMAL HIGH (ref 11.5–15.5)
WBC: 5.1 10*3/uL (ref 4.0–10.5)

## 2017-05-20 LAB — RENAL FUNCTION PANEL
ALBUMIN: 3.1 g/dL — AB (ref 3.5–5.0)
Anion gap: 17 — ABNORMAL HIGH (ref 5–15)
BUN: 55 mg/dL — ABNORMAL HIGH (ref 6–20)
CHLORIDE: 90 mmol/L — AB (ref 101–111)
CO2: 22 mmol/L (ref 22–32)
Calcium: 7.4 mg/dL — ABNORMAL LOW (ref 8.9–10.3)
Creatinine, Ser: 11.11 mg/dL — ABNORMAL HIGH (ref 0.61–1.24)
GFR, EST AFRICAN AMERICAN: 5 mL/min — AB (ref >60)
GFR, EST NON AFRICAN AMERICAN: 5 mL/min — AB (ref >60)
Glucose, Bld: 137 mg/dL — ABNORMAL HIGH (ref 65–99)
PHOSPHORUS: 6.5 mg/dL — AB (ref 2.5–4.6)
POTASSIUM: 3.8 mmol/L (ref 3.5–5.1)
Sodium: 129 mmol/L — ABNORMAL LOW (ref 135–145)

## 2017-05-20 LAB — HEPATITIS PANEL, ACUTE
HCV Ab: 0.1 s/co ratio (ref 0.0–0.9)
Hep A IgM: NEGATIVE
Hep B C IgM: NEGATIVE
Hepatitis B Surface Ag: NEGATIVE

## 2017-05-20 MED ORDER — ISOSORBIDE MONONITRATE ER 60 MG PO TB24: 60.0000 mg | ORAL_TABLET | Freq: Every day | ORAL | 0 refills | Status: DC

## 2017-05-20 MED ORDER — ZOLPIDEM TARTRATE 5 MG PO TABS
5.0000 mg | ORAL_TABLET | Freq: Every evening | ORAL | Status: DC | PRN
  Administered 2017-05-20: 5 mg via ORAL
  Filled 2017-05-20: qty 1

## 2017-05-20 MED ORDER — CARVEDILOL 25 MG PO TABS: 25.0000 mg | ORAL_TABLET | Freq: Two times a day (BID) | ORAL | 0 refills | Status: DC

## 2017-05-20 NOTE — Progress Notes (Signed)
At 0533, Patient had 22 beat run of Vtach. Assessed patient. Asymptomatic, resting in bed. Will continue to monitor.

## 2017-05-20 NOTE — Progress Notes (Signed)
Progress Note  Patient Name: [name] [name] Date of Encounter: 05/20/2017  Primary Cardiologist: Fransico Him, MD  Subjective   Feeling well.  Breathing is back to baseline.   Inpatient Medications    Scheduled Meds: . calcitRIOL  0.75 mcg Oral Q M,W,F-HD  . calcium acetate  2,001 mg Oral TID WC  . carvedilol  25 mg Oral BID WC  . enoxaparin (LOVENOX) injection  30 mg Subcutaneous QHS  . folic acid  1 mg Oral Daily  . guaiFENesin  600 mg Oral BID  . isosorbide mononitrate  60 mg Oral Daily  . multivitamin with minerals  1 tablet Oral Daily  . sodium chloride flush  3 mL Intravenous Q12H  . sodium chloride flush  3 mL Intravenous Q12H  . thiamine  100 mg Oral Daily   Or  . thiamine  100 mg Intravenous Daily   Continuous Infusions: . sodium chloride    . ferric gluconate (FERRLECIT/NULECIT) IV     PRN Meds: sodium chloride, acetaminophen **OR** acetaminophen, albuterol, calcium acetate, HYDROcodone-acetaminophen, LORazepam **OR** LORazepam, sodium chloride flush, zolpidem   Vital Signs    Vitals:   05/20/17 0009 05/20/17 0547 05/20/17 0650 05/20/17 0658  BP: 110/85 (!) 124/95 112/88 112/81  Pulse: 60 61 61 60  Resp:   11   Temp: 97.4 F (36.3 C) 98.1 F (36.7 C) 97.5 F (36.4 C)   TempSrc: Oral Oral Oral   SpO2: 100% 100% 99%   Weight:  75.8 kg (167 lb 1.6 oz) 76.4 kg (168 lb 6.9 oz)   Height:        Intake/Output Summary (Last 24 hours) at 05/20/17 0848 Last data filed at 05/20/17 0600  Gross per 24 hour  Intake              240 ml  Output                0 ml  Net              240 ml   Filed Weights   05/19/17 0611 05/20/17 0547 05/20/17 0650  Weight: 75.3 kg (166 lb) 75.8 kg (167 lb 1.6 oz) 76.4 kg (168 lb 6.9 oz)    Telemetry    No events  - Personally Reviewed  ECG    n/a - Personally Reviewed  Physical Exam   GEN: No acute distress.  Resting comfortably in HD. Neck: No JVD Cardiac: RRR, no murmurs, rubs, or gallops.  Respiratory: Clear  to auscultation bilaterally. GI: Soft, nontender, non-distended  MS: No edema; No deformity. Neuro:  Nonfocal  Psych: Normal affect  Ext: L UE fistula  Labs    Chemistry Recent Labs Lab 05/18/17 1630 05/18/17 1947 05/19/17 0659 05/20/17 0630  NA 139  --  136 129*  K 3.5  --  4.0 3.8  CL 93*  --  93* 90*  CO2 31  --  26 22  GLUCOSE 138*  --  90 137*  BUN 30*  --  39* 55*  CREATININE 8.17*  --  9.34* 11.11*  CALCIUM 7.8*  --  7.4* 7.4*  PROT  --  6.7 6.5  --   ALBUMIN  --  3.4* 3.2* 3.1*  AST  --  69* 77*  --   ALT  --  120* 124*  --   ALKPHOS  --  68 59  --   BILITOT  --  0.9 1.2  --   GFRNONAA 7*  --  6*  5*  GFRAA 8*  --  7* 5*  ANIONGAP 15  --  17* 17*     Hematology Recent Labs Lab 05/18/17 1630 05/19/17 0659 05/20/17 0630  WBC 4.5 4.9 5.1  RBC 3.97* 3.94* 3.86*  HGB 12.2* 12.1* 11.9*  HCT 36.7* 36.6* 35.4*  MCV 92.4 92.9 91.7  MCH 30.7 30.7 30.8  MCHC 33.2 33.1 33.6  RDW 15.4 15.7* 16.0*  PLT 162 150 147*    Cardiac Enzymes Recent Labs Lab 05/18/17 1947 05/19/17 0154 05/19/17 0659  TROPONINI 0.29* 0.28* 0.26*    Recent Labs Lab 05/18/17 1654  TROPIPOC 0.24*     BNPNo results for input(s): BNP, PROBNP in the last 168 hours.   DDimer No results for input(s): DDIMER in the last 168 hours.   Radiology    Dg Chest 2 View  Result Date: 05/18/2017 CLINICAL DATA:  Shortness of breath EXAM: CHEST  2 VIEW COMPARISON:  02/20/2016 FINDINGS: Mild cardiomegaly. No edema or pleural effusion. No pneumothorax. Probable old right eighth and ninth rib fractures. IMPRESSION: Mild cardiomegaly.  No edema or infiltrate. Electronically Signed   By: Donavan Foil M.D.   On: 05/18/2017 17:37   Ct Angio Chest Pe W Or Wo Contrast  Result Date: 05/18/2017 CLINICAL DATA:  Chest pain times 3-4 days with pain beneath right lower ribs. End-stage renal disease on dialysis greater than 5 years. Last dialysis was today. History of right inguinal hernia repair and umbilical  hernia repair. EXAM: CT ANGIOGRAPHY CHEST CT ABDOMEN AND PELVIS WITH CONTRAST TECHNIQUE: Multidetector CT imaging of the chest was performed using the standard protocol during bolus administration of intravenous contrast. Multiplanar CT image reconstructions and MIPs were obtained to evaluate the vascular anatomy. Multidetector CT imaging of the abdomen and pelvis was performed using the standard protocol during bolus administration of intravenous contrast. CONTRAST:  100 cc Isovue 370 IV. COMPARISON:  02/21/2016 CT, 03/08/2014 CT FINDINGS: CTA CHEST FINDINGS Cardiovascular: Cardiomegaly. Reflux of contrast into the IVC and hepatic veins consistent with right heart failure. No pericardial effusion. Aortic atherosclerosis without aneurysm. No large central pulmonary embolus. Coronary arteriosclerosis is identified along the circumflex. Mediastinum/Nodes: Mild mediastinal lymphadenopathy measuring up to 14 mm in the right lower paratracheal and prevascular portions of the mediastinum possibly reactive in etiology. No hilar lymphadenopathy. Trachea, esophagus and thyroid gland are unremarkable. Lungs/Pleura: Small right pleural effusion. No pneumonic consolidation. No dominant mass. Musculoskeletal: Chronic right lower rib fractures. No acute osseous abnormality. Osteoarthritis of the right glenohumeral joint with subcortical and subchondral cystic change. Review of the MIP images confirms the above findings. CT ABDOMEN and PELVIS FINDINGS Hepatobiliary: Cholelithiasis with slight thickened appearance of the gallbladder some which may be due to sympathetic thickening from small volume of ascites noted in the abdomen. Slightly nodular appearance of the liver surface may represent changes of cirrhosis. No space-occupying mass. Pancreas: No pancreatic ductal dilatation or mass. Spleen: No splenomegaly or mass. Adrenals/Urinary Tract: Several bilateral simple and complex renal cysts are noted some which demonstrate  milk-of-calcium for example in the left upper pole, series 14, image 34 and others which are hyperdense, example 9 mm in diameter in the right upper pole image 29, too small to further characterize but similar in size dating back to 2015 and are likely of benign etiology. No obstructive uropathy. Renovascular calcifications are noted bilaterally. No obstructive uropathy. The urinary bladder is contracted. Renal cortical scarring is seen bilaterally. No adrenal mass is identified. Stomach/Bowel: Contracted stomach with normal small bowel rotation.  No bowel obstruction or inflammation. Normal appendix. Scattered colonic diverticulosis is identified without acute diverticulitis. Vascular/Lymphatic: No aortic aneurysm or dissection. No lymphadenopathy. A few mildly enlarged inguinal lymph nodes are seen bilaterally which may be reactive in etiology the largest approximately mm short axis. Reproductive: Mildly enlarged prostate gland measuring 4.6 x 3.7 cm in transverse by AP dimension. Unremarkable seminal vesicles. Other: Small volume of ascites is noted within the abdomen outlining the liver, pancreas, spleen and along the pericolic gutters. Musculoskeletal: Degenerative disc disease L5-S1 with slight retrolisthesis grade 1 of L5 on S1. No acute nor suspicious osseous lesions. Review of the MIP images confirms the above findings. IMPRESSION: 1. Stable cardiomegaly with changes suggesting right heart failure with reflux of contrast into the IVC and right hepatic veins with trace right effusion. 2. Coronary arteriosclerosis and aortic atherosclerosis. No aneurysm or dissection. No acute pulmonary embolus. 3. Slightly nodular appearance of the liver surface which may reflect changes of cirrhosis. Small volume of ascites in the abdomen. 4. Mild reactive adenopathy suspected of the mediastinal lymph nodes with prevascular and right paratracheal lymph nodes measuring up to 14 mm short axis. Mild enlargement of the inguinal  lymph nodes measuring up to 8 mm short axis. 5. Sympathetic thickening of the gallbladder wall likely secondary to adjacent ascites and cholelithiasis. Findings are similar to that seen previously. 6. Simple and complex renal cysts with cortical scarring of the kidneys. No dominant mass noted. 7. Colonic diverticulosis without acute diverticulitis. Electronically Signed   By: Ashley Royalty M.D.   On: 05/18/2017 22:37   Ct Abdomen Pelvis W Contrast  Result Date: 05/18/2017 CLINICAL DATA:  Chest pain times 3-4 days with pain beneath right lower ribs. End-stage renal disease on dialysis greater than 5 years. Last dialysis was today. History of right inguinal hernia repair and umbilical hernia repair. EXAM: CT ANGIOGRAPHY CHEST CT ABDOMEN AND PELVIS WITH CONTRAST TECHNIQUE: Multidetector CT imaging of the chest was performed using the standard protocol during bolus administration of intravenous contrast. Multiplanar CT image reconstructions and MIPs were obtained to evaluate the vascular anatomy. Multidetector CT imaging of the abdomen and pelvis was performed using the standard protocol during bolus administration of intravenous contrast. CONTRAST:  100 cc Isovue 370 IV. COMPARISON:  02/21/2016 CT, 03/08/2014 CT FINDINGS: CTA CHEST FINDINGS Cardiovascular: Cardiomegaly. Reflux of contrast into the IVC and hepatic veins consistent with right heart failure. No pericardial effusion. Aortic atherosclerosis without aneurysm. No large central pulmonary embolus. Coronary arteriosclerosis is identified along the circumflex. Mediastinum/Nodes: Mild mediastinal lymphadenopathy measuring up to 14 mm in the right lower paratracheal and prevascular portions of the mediastinum possibly reactive in etiology. No hilar lymphadenopathy. Trachea, esophagus and thyroid gland are unremarkable. Lungs/Pleura: Small right pleural effusion. No pneumonic consolidation. No dominant mass. Musculoskeletal: Chronic right lower rib fractures. No  acute osseous abnormality. Osteoarthritis of the right glenohumeral joint with subcortical and subchondral cystic change. Review of the MIP images confirms the above findings. CT ABDOMEN and PELVIS FINDINGS Hepatobiliary: Cholelithiasis with slight thickened appearance of the gallbladder some which may be due to sympathetic thickening from small volume of ascites noted in the abdomen. Slightly nodular appearance of the liver surface may represent changes of cirrhosis. No space-occupying mass. Pancreas: No pancreatic ductal dilatation or mass. Spleen: No splenomegaly or mass. Adrenals/Urinary Tract: Several bilateral simple and complex renal cysts are noted some which demonstrate milk-of-calcium for example in the left upper pole, series 14, image 34 and others which are hyperdense, example 9 mm in  diameter in the right upper pole image 29, too small to further characterize but similar in size dating back to 2015 and are likely of benign etiology. No obstructive uropathy. Renovascular calcifications are noted bilaterally. No obstructive uropathy. The urinary bladder is contracted. Renal cortical scarring is seen bilaterally. No adrenal mass is identified. Stomach/Bowel: Contracted stomach with normal small bowel rotation. No bowel obstruction or inflammation. Normal appendix. Scattered colonic diverticulosis is identified without acute diverticulitis. Vascular/Lymphatic: No aortic aneurysm or dissection. No lymphadenopathy. A few mildly enlarged inguinal lymph nodes are seen bilaterally which may be reactive in etiology the largest approximately mm short axis. Reproductive: Mildly enlarged prostate gland measuring 4.6 x 3.7 cm in transverse by AP dimension. Unremarkable seminal vesicles. Other: Small volume of ascites is noted within the abdomen outlining the liver, pancreas, spleen and along the pericolic gutters. Musculoskeletal: Degenerative disc disease L5-S1 with slight retrolisthesis grade 1 of L5 on S1. No  acute nor suspicious osseous lesions. Review of the MIP images confirms the above findings. IMPRESSION: 1. Stable cardiomegaly with changes suggesting right heart failure with reflux of contrast into the IVC and right hepatic veins with trace right effusion. 2. Coronary arteriosclerosis and aortic atherosclerosis. No aneurysm or dissection. No acute pulmonary embolus. 3. Slightly nodular appearance of the liver surface which may reflect changes of cirrhosis. Small volume of ascites in the abdomen. 4. Mild reactive adenopathy suspected of the mediastinal lymph nodes with prevascular and right paratracheal lymph nodes measuring up to 14 mm short axis. Mild enlargement of the inguinal lymph nodes measuring up to 8 mm short axis. 5. Sympathetic thickening of the gallbladder wall likely secondary to adjacent ascites and cholelithiasis. Findings are similar to that seen previously. 6. Simple and complex renal cysts with cortical scarring of the kidneys. No dominant mass noted. 7. Colonic diverticulosis without acute diverticulitis. Electronically Signed   By: Ashley Royalty M.D.   On: 05/18/2017 22:37   Nm Hepato W/eject Fract  Result Date: 05/19/2017 CLINICAL DATA:  Right upper quadrant pain EXAM: NUCLEAR MEDICINE HEPATOBILIARY IMAGING WITH GALLBLADDER EF TECHNIQUE: Sequential images of the abdomen were obtained out to 60 minutes following intravenous administration of radiopharmaceutical. After oral ingestion of 8 ounces of Half-and-Half cream, gallbladder ejection fraction was determined. RADIOPHARMACEUTICALS:  5.28 mCi Tc-36m Choletec IV COMPARISON:  None. FINDINGS: Prompt uptake and biliary excretion of activity by the liver is seen. Gallbladder activity is visualized, consistent with patency of cystic duct. Biliary activity passes into small bowel, consistent with patent common bile duct. Calculated gallbladder ejection fraction is 54%. (Normal gallbladder ejection fraction with half-and-half is greater than 33%.)  IMPRESSION: Cystic and common bile ducts are patent. Gallbladder ejection fraction is within normal limits. Electronically Signed   By: Marybelle Killings M.D.   On: 05/19/2017 18:55   US Abdomen Limited Ruq  Result Date: 05/19/2017 CLINICAL DATA:  Right upper quadrant pain EXAM: ULTRASOUND ABDOMEN LIMITED RIGHT UPPER QUADRANT COMPARISON:  05/18/2017 FINDINGS: Gallbladder: Small gallstones, the largest 7 mm. Gallbladder wall thickening measuring 7 mm. Negative sonographic Murphy's. Common bile duct: Diameter: Normal caliber, 3 mm Liver: No focal lesion identified. Within normal limits in parenchymal echogenicity. Incidentally noted is small amount of perihepatic ascites and right effusion. IMPRESSION: Few small layering gallstones. Gallbladder wall thickening without sonographic Murphy's sign. Findings may reflect chronic cholecystitis. Recommend clinical correlation to exclude acute cholecystitis. If felt clinically indicated, nuclear medicine hepatobiliary scan may be helpful. Small amount of perihepatic ascites.  Right pleural effusion. Electronically Signed   By:  Rolm Baptise M.D.   On: 05/19/2017 07:38    Cardiac Studies   Echo 02/21/16: Study Conclusions  - Left ventricle: The cavity size was normal. Wall thickness was   increased in a pattern of mild LVH. There was mild concentric   hypertrophy. Systolic function was severely reduced. The   estimated ejection fraction was in the range of 25% to 30%.   Diffuse hypokinesis. Features are consistent with a pseudonormal   left ventricular filling pattern, with concomitant abnormal   relaxation and increased filling pressure (grade 2 diastolic   dysfunction). Doppler parameters are consistent with high   ventricular filling pressure. - Aortic valve: Transvalvular velocity was within the normal range.   There was no stenosis. There was no regurgitation. - Mitral valve: There was mild regurgitation. - Left atrium: The atrium was severely dilated. -  Right ventricle: The cavity size was mildly dilated. Wall   thickness was normal. Systolic function was moderately reduced. - Right atrium: The atrium was severely dilated. - Atrial septum: No defect or patent foramen ovale was identified   by color flow Doppler. - Tricuspid valve: There was mild regurgitation. - Pulmonic valve: There was moderate regurgitation. - Pulmonary arteries: Systolic pressure was severely increased. PA   peak pressure: 55 mm Hg (S). - Inferior vena cava: The vessel was dilated. The respirophasic   diameter changes were blunted (< 50%), consistent with elevated   central venous pressure.  LHC 02/25/16: 1. No angiographic evidence of CAD 2. Moderate pulmonary HTN 3. Elevated LV filling pressures.   Patient Profile     51 y.o. male with Chronic systolic and diastolic heart failure, ventricular tachycardia in the setting of cocaine abuse and hypertensive emergency, noncompliance and ESRD on HD here was acute on chronic systolic and diastolic heart failure.  Assessment & Plan    # Acute on systolic and diastolic heart failure: LVEF 25-30%.  [name] presented with acute systolic and diastolic heart failure in the setting of not taking his BP medication for several days. Respiratory status is back to baseline and he is not volume overloaded.  Volume management with hemodialysis. They're going to try adjusting his dry weight to see if he can remove more fluid.  He is not a good candidate for ICD due to noncompliance and drug abuse.   # Hypertension: Blood pressures while-controlled medication regimen was restarted.  Continue carvedilol and Imdur.    Signed, Skeet Latch, MD  05/20/2017, 8:48 AM

## 2017-05-20 NOTE — Discharge Summary (Addendum)
Physician Discharge Summary  [name] [name] NIO:[id number] DOB: October 07, 1966 DOA: 05/18/2017  PCP: Beaulah Corin, [facility]  Admit date: 05/18/2017 Discharge date: 05/20/2017   Recommendations for Outpatient Follow-Up:   1. Compliance with medications   Discharge Diagnosis:   Active Problems:   Alcohol abuse   Cocaine abuse   ESRD (end stage renal disease) on dialysis (HCC)   Elevated troponin   Combined congestive systolic and diastolic heart failure (HCC)   Non-ischemic cardiomyopathy (HCC)   Prolonged QT interval   Prolonged Q-T interval on ECG   RUQ pain   Acute systolic CHF (congestive heart failure) [facility])   Discharge disposition:  Home.  Discharge Condition: Improved.  Diet recommendation: renal  Wound care: None.   History of Present Illness:   Presented with 4 days dyspnea and exertional shortness of breath with some orthopnea. Endorses RUQ pain and tenderness. He been feeling un well. No nausea.  symptoms has progressively been getting worse he denies any leg edema feels that he always been dialyzed down to her dry weight. He has occasional nonproductive cough but has been getting better. He continues to smoke about 2 cigarettes a day. Reports no cocaine for the past 90 days Last hemodialysis was this a.m.  reports no fever no chest pain.  NO recent sick contacts, no travel. Still able to make urine. -- ran out of medications several weeks prior   Hospital Course by Problem:   acute on systolic and diastolic heart failure: LVEF 25-30%.   in the setting of not taking his BP medication for several days. Respiratory status is back to baseline and he is not volume overloaded.  Volume management with hemodialysis.  not a good candidate for ICD due to noncompliance and drug abuse per cardiology  Hypertension:   Continue carvedilol and Imdur-- had run out at home  Elevated LFTS; most likely due to alcohol abuse, HIDA scan normal after abnormal U/S -alcohol cessation  counseled.    ESRD -continue HD M/W/F   Medical Consultants:    Cards  General surgery   Discharge Exam:   Vitals:   05/20/17 0930 05/20/17 1000  BP: 122/90 (!) 129/92  Pulse: 61 (!) 50  Resp:  14  Temp:  97.6 F (36.4 C)   Vitals:   05/20/17 0830 05/20/17 0900 05/20/17 0930 05/20/17 1000  BP: (!) 119/92 (!) 133/100 122/90 (!) 129/92  Pulse: 63 62 61 (!) 50  Resp:    14  Temp:    97.6 F (36.4 C)  TempSrc:    Oral  SpO2:    99%  Weight:    73.7 kg (162 lb 7.7 oz)  Height:        Gen:  NAD    The results of significant diagnostics from this hospitalization (including imaging, microbiology, ancillary and laboratory) are listed below for reference.     Procedures and Diagnostic Studies:   Dg Chest 2 View  Result Date: 05/18/2017 CLINICAL DATA:  Shortness of breath EXAM: CHEST  2 VIEW COMPARISON:  02/20/2016 FINDINGS: Mild cardiomegaly. No edema or pleural effusion. No pneumothorax. Probable old right eighth and ninth rib fractures. IMPRESSION: Mild cardiomegaly.  No edema or infiltrate. Electronically Signed   By: Donavan Foil M.D.   On: 05/18/2017 17:37   Ct Angio Chest Pe W Or Wo Contrast  Result Date: 05/18/2017 CLINICAL DATA:  Chest pain times 3-4 days with pain beneath right lower ribs. End-stage renal disease on dialysis greater than 5 years. Last dialysis was today.  History of right inguinal hernia repair and umbilical hernia repair. EXAM: CT ANGIOGRAPHY CHEST CT ABDOMEN AND PELVIS WITH CONTRAST TECHNIQUE: Multidetector CT imaging of the chest was performed using the standard protocol during bolus administration of intravenous contrast. Multiplanar CT image reconstructions and MIPs were obtained to evaluate the vascular anatomy. Multidetector CT imaging of the abdomen and pelvis was performed using the standard protocol during bolus administration of intravenous contrast. CONTRAST:  100 cc Isovue 370 IV. COMPARISON:  02/21/2016 CT, 03/08/2014 CT FINDINGS: CTA  CHEST FINDINGS Cardiovascular: Cardiomegaly. Reflux of contrast into the IVC and hepatic veins consistent with right heart failure. No pericardial effusion. Aortic atherosclerosis without aneurysm. No large central pulmonary embolus. Coronary arteriosclerosis is identified along the circumflex. Mediastinum/Nodes: Mild mediastinal lymphadenopathy measuring up to 14 mm in the right lower paratracheal and prevascular portions of the mediastinum possibly reactive in etiology. No hilar lymphadenopathy. Trachea, esophagus and thyroid gland are unremarkable. Lungs/Pleura: Small right pleural effusion. No pneumonic consolidation. No dominant mass. Musculoskeletal: Chronic right lower rib fractures. No acute osseous abnormality. Osteoarthritis of the right glenohumeral joint with subcortical and subchondral cystic change. Review of the MIP images confirms the above findings. CT ABDOMEN and PELVIS FINDINGS Hepatobiliary: Cholelithiasis with slight thickened appearance of the gallbladder some which may be due to sympathetic thickening from small volume of ascites noted in the abdomen. Slightly nodular appearance of the liver surface may represent changes of cirrhosis. No space-occupying mass. Pancreas: No pancreatic ductal dilatation or mass. Spleen: No splenomegaly or mass. Adrenals/Urinary Tract: Several bilateral simple and complex renal cysts are noted some which demonstrate milk-of-calcium for example in the left upper pole, series 14, image 34 and others which are hyperdense, example 9 mm in diameter in the right upper pole image 29, too small to further characterize but similar in size dating back to 2015 and are likely of benign etiology. No obstructive uropathy. Renovascular calcifications are noted bilaterally. No obstructive uropathy. The urinary bladder is contracted. Renal cortical scarring is seen bilaterally. No adrenal mass is identified. Stomach/Bowel: Contracted stomach with normal small bowel rotation. No  bowel obstruction or inflammation. Normal appendix. Scattered colonic diverticulosis is identified without acute diverticulitis. Vascular/Lymphatic: No aortic aneurysm or dissection. No lymphadenopathy. A few mildly enlarged inguinal lymph nodes are seen bilaterally which may be reactive in etiology the largest approximately mm short axis. Reproductive: Mildly enlarged prostate gland measuring 4.6 x 3.7 cm in transverse by AP dimension. Unremarkable seminal vesicles. Other: Small volume of ascites is noted within the abdomen outlining the liver, pancreas, spleen and along the pericolic gutters. Musculoskeletal: Degenerative disc disease L5-S1 with slight retrolisthesis grade 1 of L5 on S1. No acute nor suspicious osseous lesions. Review of the MIP images confirms the above findings. IMPRESSION: 1. Stable cardiomegaly with changes suggesting right heart failure with reflux of contrast into the IVC and right hepatic veins with trace right effusion. 2. Coronary arteriosclerosis and aortic atherosclerosis. No aneurysm or dissection. No acute pulmonary embolus. 3. Slightly nodular appearance of the liver surface which may reflect changes of cirrhosis. Small volume of ascites in the abdomen. 4. Mild reactive adenopathy suspected of the mediastinal lymph nodes with prevascular and right paratracheal lymph nodes measuring up to 14 mm short axis. Mild enlargement of the inguinal lymph nodes measuring up to 8 mm short axis. 5. Sympathetic thickening of the gallbladder wall likely secondary to adjacent ascites and cholelithiasis. Findings are similar to that seen previously. 6. Simple and complex renal cysts with cortical scarring of the kidneys.  No dominant mass noted. 7. Colonic diverticulosis without acute diverticulitis. Electronically Signed   By: Ashley Royalty M.D.   On: 05/18/2017 22:37   Ct Abdomen Pelvis W Contrast  Result Date: 05/18/2017 CLINICAL DATA:  Chest pain times 3-4 days with pain beneath right lower ribs.  End-stage renal disease on dialysis greater than 5 years. Last dialysis was today. History of right inguinal hernia repair and umbilical hernia repair. EXAM: CT ANGIOGRAPHY CHEST CT ABDOMEN AND PELVIS WITH CONTRAST TECHNIQUE: Multidetector CT imaging of the chest was performed using the standard protocol during bolus administration of intravenous contrast. Multiplanar CT image reconstructions and MIPs were obtained to evaluate the vascular anatomy. Multidetector CT imaging of the abdomen and pelvis was performed using the standard protocol during bolus administration of intravenous contrast. CONTRAST:  100 cc Isovue 370 IV. COMPARISON:  02/21/2016 CT, 03/08/2014 CT FINDINGS: CTA CHEST FINDINGS Cardiovascular: Cardiomegaly. Reflux of contrast into the IVC and hepatic veins consistent with right heart failure. No pericardial effusion. Aortic atherosclerosis without aneurysm. No large central pulmonary embolus. Coronary arteriosclerosis is identified along the circumflex. Mediastinum/Nodes: Mild mediastinal lymphadenopathy measuring up to 14 mm in the right lower paratracheal and prevascular portions of the mediastinum possibly reactive in etiology. No hilar lymphadenopathy. Trachea, esophagus and thyroid gland are unremarkable. Lungs/Pleura: Small right pleural effusion. No pneumonic consolidation. No dominant mass. Musculoskeletal: Chronic right lower rib fractures. No acute osseous abnormality. Osteoarthritis of the right glenohumeral joint with subcortical and subchondral cystic change. Review of the MIP images confirms the above findings. CT ABDOMEN and PELVIS FINDINGS Hepatobiliary: Cholelithiasis with slight thickened appearance of the gallbladder some which may be due to sympathetic thickening from small volume of ascites noted in the abdomen. Slightly nodular appearance of the liver surface may represent changes of cirrhosis. No space-occupying mass. Pancreas: No pancreatic ductal dilatation or mass. Spleen:  No splenomegaly or mass. Adrenals/Urinary Tract: Several bilateral simple and complex renal cysts are noted some which demonstrate milk-of-calcium for example in the left upper pole, series 14, image 34 and others which are hyperdense, example 9 mm in diameter in the right upper pole image 29, too small to further characterize but similar in size dating back to 2015 and are likely of benign etiology. No obstructive uropathy. Renovascular calcifications are noted bilaterally. No obstructive uropathy. The urinary bladder is contracted. Renal cortical scarring is seen bilaterally. No adrenal mass is identified. Stomach/Bowel: Contracted stomach with normal small bowel rotation. No bowel obstruction or inflammation. Normal appendix. Scattered colonic diverticulosis is identified without acute diverticulitis. Vascular/Lymphatic: No aortic aneurysm or dissection. No lymphadenopathy. A few mildly enlarged inguinal lymph nodes are seen bilaterally which may be reactive in etiology the largest approximately mm short axis. Reproductive: Mildly enlarged prostate gland measuring 4.6 x 3.7 cm in transverse by AP dimension. Unremarkable seminal vesicles. Other: Small volume of ascites is noted within the abdomen outlining the liver, pancreas, spleen and along the pericolic gutters. Musculoskeletal: Degenerative disc disease L5-S1 with slight retrolisthesis grade 1 of L5 on S1. No acute nor suspicious osseous lesions. Review of the MIP images confirms the above findings. IMPRESSION: 1. Stable cardiomegaly with changes suggesting right heart failure with reflux of contrast into the IVC and right hepatic veins with trace right effusion. 2. Coronary arteriosclerosis and aortic atherosclerosis. No aneurysm or dissection. No acute pulmonary embolus. 3. Slightly nodular appearance of the liver surface which may reflect changes of cirrhosis. Small volume of ascites in the abdomen. 4. Mild reactive adenopathy suspected of the mediastinal  lymph nodes with prevascular and right paratracheal lymph nodes measuring up to 14 mm short axis. Mild enlargement of the inguinal lymph nodes measuring up to 8 mm short axis. 5. Sympathetic thickening of the gallbladder wall likely secondary to adjacent ascites and cholelithiasis. Findings are similar to that seen previously. 6. Simple and complex renal cysts with cortical scarring of the kidneys. No dominant mass noted. 7. Colonic diverticulosis without acute diverticulitis. Electronically Signed   By: Ashley Royalty M.D.   On: 05/18/2017 22:37   Nm Hepato W/eject Fract  Result Date: 05/19/2017 CLINICAL DATA:  Right upper quadrant pain EXAM: NUCLEAR MEDICINE HEPATOBILIARY IMAGING WITH GALLBLADDER EF TECHNIQUE: Sequential images of the abdomen were obtained out to 60 minutes following intravenous administration of radiopharmaceutical. After oral ingestion of 8 ounces of Half-and-Half cream, gallbladder ejection fraction was determined. RADIOPHARMACEUTICALS:  5.28 mCi Tc-73m Choletec IV COMPARISON:  None. FINDINGS: Prompt uptake and biliary excretion of activity by the liver is seen. Gallbladder activity is visualized, consistent with patency of cystic duct. Biliary activity passes into small bowel, consistent with patent common bile duct. Calculated gallbladder ejection fraction is 54%. (Normal gallbladder ejection fraction with half-and-half is greater than 33%.) IMPRESSION: Cystic and common bile ducts are patent. Gallbladder ejection fraction is within normal limits. Electronically Signed   By: Marybelle Killings M.D.   On: 05/19/2017 18:55   US Abdomen Limited Ruq  Result Date: 05/19/2017 CLINICAL DATA:  Right upper quadrant pain EXAM: ULTRASOUND ABDOMEN LIMITED RIGHT UPPER QUADRANT COMPARISON:  05/18/2017 FINDINGS: Gallbladder: Small gallstones, the largest 7 mm. Gallbladder wall thickening measuring 7 mm. Negative sonographic Murphy's. Common bile duct: Diameter: Normal caliber, 3 mm Liver: No focal lesion  identified. Within normal limits in parenchymal echogenicity. Incidentally noted is small amount of perihepatic ascites and right effusion. IMPRESSION: Few small layering gallstones. Gallbladder wall thickening without sonographic Murphy's sign. Findings may reflect chronic cholecystitis. Recommend clinical correlation to exclude acute cholecystitis. If felt clinically indicated, nuclear medicine hepatobiliary scan may be helpful. Small amount of perihepatic ascites.  Right pleural effusion. Electronically Signed   By: Rolm Baptise M.D.   On: 05/19/2017 07:38     Labs:   Basic Metabolic Panel:  Recent Labs Lab 05/18/17 1630 05/18/17 1947 05/19/17 0659 05/20/17 0630  NA 139  --  136 129*  K 3.5  --  4.0 3.8  CL 93*  --  93* 90*  CO2 31  --  26 22  GLUCOSE 138*  --  90 137*  BUN 30*  --  39* 55*  CREATININE 8.17*  --  9.34* 11.11*  CALCIUM 7.8*  --  7.4* 7.4*  MG  --  2.8* 2.9*  --   PHOS  --   --  6.2* 6.5*   GFR Estimated Creatinine Clearance: 8 mL/min (A) (by C-G formula based on SCr of 11.11 mg/dL (H)). Liver Function Tests:  Recent Labs Lab 05/18/17 1947 05/19/17 0659 05/20/17 0630  AST 69* 77*  --   ALT 120* 124*  --   ALKPHOS 68 59  --   BILITOT 0.9 1.2  --   PROT 6.7 6.5  --   ALBUMIN 3.4* 3.2* 3.1*    Recent Labs Lab 05/18/17 1947  LIPASE 24   No results for input(s): AMMONIA in the last 168 hours. Coagulation profile  Recent Labs Lab 05/19/17 0659  INR 1.40    CBC:  Recent Labs Lab 05/18/17 1630 05/19/17 0659 05/20/17 0630  WBC 4.5 4.9 5.1  HGB 12.2*  12.1* 11.9*  HCT 36.7* 36.6* 35.4*  MCV 92.4 92.9 91.7  PLT 162 150 147*   Cardiac Enzymes:  Recent Labs Lab 05/18/17 1947 05/19/17 0154 05/19/17 0659  TROPONINI 0.29* 0.28* 0.26*   BNP: Invalid input(s): POCBNP CBG: No results for input(s): GLUCAP in the last 168 hours. D-Dimer No results for input(s): DDIMER in the last 72 hours. Hgb A1c No results for input(s): HGBA1C in the  last 72 hours. Lipid Profile  Recent Labs  05/19/17 0659  CHOL 62  HDL 29*  LDLCALC 22  TRIG 56  CHOLHDL 2.1   Thyroid function studies  Recent Labs  05/19/17 0154  TSH 4.659*   Anemia work up No results for input(s): VITAMINB12, FOLATE, FERRITIN, TIBC, IRON, RETICCTPCT in the last 72 hours. Microbiology Recent Results (from the past 240 hour(s))  MRSA PCR Screening     Status: None   Collection Time: 05/18/17 11:27 PM  Result Value Ref Range Status   MRSA by PCR NEGATIVE NEGATIVE Final    Comment:        The GeneXpert MRSA Assay (FDA approved for NASAL specimens only), is one component of a comprehensive MRSA colonization surveillance program. It is not intended to diagnose MRSA infection nor to guide or monitor treatment for MRSA infections.      Discharge Instructions:   Discharge Instructions    Discharge instructions    Complete by:  As directed    Alcohol cessation Establish with PCP Renal diet   Increase activity slowly    Complete by:  As directed      Allergies as of 05/20/2017      Reactions   Losartan Cough      Medication List    TAKE these medications   acetaminophen 325 MG tablet Commonly known as:  TYLENOL Take 650 mg by mouth every 6 (six) hours as needed for headache (pain).   calcium acetate 667 MG capsule Commonly known as:  PHOSLO Take 3 capsules (2,001 mg total) by mouth 3 (three) times daily with meals. What changed:  how much to take  when to take this  additional instructions   carvedilol 25 MG tablet Commonly known as:  COREG Take 1 tablet (25 mg total) by mouth 2 (two) times daily.   isosorbide mononitrate 60 MG 24 hr tablet Commonly known as:  IMDUR Take 1 tablet (60 mg total) by mouth daily.   multivitamin with minerals Tabs tablet Take 1 tablet by mouth daily.      Follow-up Information    [facility], [facility] Follow up in 1 week(s).   Specialty:  Internal Medicine Why:  resume HD Contact  information: [address] [place] [address] [phone]            Time coordinating discharge: 35 min  Signed:  [place]   [facility] 05/20/2017, 12:29 PM

## 2017-05-20 NOTE — Procedures (Signed)
I was present at this dialysis session. I have reviewed the session itself and made appropriate changes.   Cardiology notes reviewed.  Probe for lower EDW with UF 2.5L.  4K bath.  Next HD 6/8 as inpatient or outpatient.    Filed Weights   05/19/17 0611 05/20/17 0547 05/20/17 0650  Weight: 75.3 kg (166 lb) 75.8 kg (167 lb 1.6 oz) 76.4 kg (168 lb 6.9 oz)     Recent Labs Lab 05/20/17 0630  NA 129*  K 3.8  CL 90*  CO2 22  GLUCOSE 137*  BUN 55*  CREATININE 11.11*  CALCIUM 7.4*  PHOS 6.5*     Recent Labs Lab 05/18/17 1630 05/19/17 0659 05/20/17 0630  WBC 4.5 4.9 5.1  HGB 12.2* 12.1* 11.9*  HCT 36.7* 36.6* 35.4*  MCV 92.4 92.9 91.7  PLT 162 150 147*    Scheduled Meds: . calcitRIOL  0.75 mcg Oral Q M,W,F-HD  . calcium acetate  2,001 mg Oral TID WC  . carvedilol  25 mg Oral BID WC  . enoxaparin (LOVENOX) injection  30 mg Subcutaneous QHS  . folic acid  1 mg Oral Daily  . guaiFENesin  600 mg Oral BID  . isosorbide mononitrate  60 mg Oral Daily  . multivitamin with minerals  1 tablet Oral Daily  . sodium chloride flush  3 mL Intravenous Q12H  . sodium chloride flush  3 mL Intravenous Q12H  . thiamine  100 mg Oral Daily   Or  . thiamine  100 mg Intravenous Daily   Continuous Infusions: . sodium chloride    . ferric gluconate (FERRLECIT/NULECIT) IV     PRN Meds:.sodium chloride, acetaminophen **OR** acetaminophen, albuterol, calcium acetate, HYDROcodone-acetaminophen, LORazepam **OR** LORazepam, sodium chloride flush, zolpidem   Pearson Grippe  MD 05/20/2017, 8:05 AM

## 2017-05-22 DIAGNOSIS — N2581 Secondary hyperparathyroidism of renal origin: Secondary | ICD-10-CM | POA: Diagnosis not present

## 2017-05-22 DIAGNOSIS — D509 Iron deficiency anemia, unspecified: Secondary | ICD-10-CM | POA: Diagnosis not present

## 2017-05-22 DIAGNOSIS — N186 End stage renal disease: Secondary | ICD-10-CM | POA: Diagnosis not present

## 2017-05-25 DIAGNOSIS — N2581 Secondary hyperparathyroidism of renal origin: Secondary | ICD-10-CM | POA: Diagnosis not present

## 2017-05-25 DIAGNOSIS — D509 Iron deficiency anemia, unspecified: Secondary | ICD-10-CM | POA: Diagnosis not present

## 2017-05-25 DIAGNOSIS — N186 End stage renal disease: Secondary | ICD-10-CM | POA: Diagnosis not present

## 2017-05-25 LAB — OPIATES,MS,WB/SP RFX
6-ACETYLMORPHINE: NEGATIVE
CODEINE: NEGATIVE ng/mL
Dihydrocodeine: NEGATIVE ng/mL
Hydrocodone: 7.5 ng/mL
Hydromorphone: NEGATIVE ng/mL
MORPHINE: NEGATIVE ng/mL
OPIATE CONFIRMATION: POSITIVE

## 2017-05-25 LAB — OXYCODONES,MS,WB/SP RFX
OXYMORPHONE: NEGATIVE ng/mL
Oxycocone: NEGATIVE ng/mL
Oxycodones Confirmation: NEGATIVE

## 2017-05-26 LAB — COCAINE,MS,WB/SP RFX
BENZOYLECGONINE: 105 ng/mL
COCAINE CONFIRMATION: POSITIVE
Cocaine: NEGATIVE ng/mL

## 2017-05-27 DIAGNOSIS — N2581 Secondary hyperparathyroidism of renal origin: Secondary | ICD-10-CM | POA: Diagnosis not present

## 2017-05-27 DIAGNOSIS — N186 End stage renal disease: Secondary | ICD-10-CM | POA: Diagnosis not present

## 2017-05-27 DIAGNOSIS — D509 Iron deficiency anemia, unspecified: Secondary | ICD-10-CM | POA: Diagnosis not present

## 2017-05-29 DIAGNOSIS — N2581 Secondary hyperparathyroidism of renal origin: Secondary | ICD-10-CM | POA: Diagnosis not present

## 2017-05-29 DIAGNOSIS — N186 End stage renal disease: Secondary | ICD-10-CM | POA: Diagnosis not present

## 2017-05-29 DIAGNOSIS — D509 Iron deficiency anemia, unspecified: Secondary | ICD-10-CM | POA: Diagnosis not present

## 2017-05-31 LAB — DRUG SCREEN 10 W/CONF, SERUM
AMPHETAMINES, IA: NEGATIVE ng/mL
BENZODIAZEPINES, IA: NEGATIVE ng/mL
Barbiturates, IA: NEGATIVE ug/mL
COCAINE & METABOLITE, IA: POSITIVE ng/mL
Methadone, IA: NEGATIVE ng/mL
OPIATES, IA: POSITIVE ng/mL
Oxycodones, IA: NEGATIVE ng/mL
PROPOXYPHENE, IA: NEGATIVE ng/mL
Phencyclidine, IA: NEGATIVE ng/mL
THC(Marijuana) Metabolite, IA: POSITIVE ng/mL

## 2017-05-31 LAB — THC,MS,WB/SP RFX
CANNABIDIOL: NEGATIVE ng/mL
Cannabinoid Confirmation: POSITIVE
Cannabinol: NEGATIVE ng/mL
Carboxy-THC: 17.4 ng/mL
Hydroxy-THC: NEGATIVE ng/mL
TETRAHYDROCANNABINOL(THC): 1.5 ng/mL

## 2017-06-01 DIAGNOSIS — N2581 Secondary hyperparathyroidism of renal origin: Secondary | ICD-10-CM | POA: Diagnosis not present

## 2017-06-01 DIAGNOSIS — D509 Iron deficiency anemia, unspecified: Secondary | ICD-10-CM | POA: Diagnosis not present

## 2017-06-01 DIAGNOSIS — N186 End stage renal disease: Secondary | ICD-10-CM | POA: Diagnosis not present

## 2017-06-03 DIAGNOSIS — N186 End stage renal disease: Secondary | ICD-10-CM | POA: Diagnosis not present

## 2017-06-03 DIAGNOSIS — N2581 Secondary hyperparathyroidism of renal origin: Secondary | ICD-10-CM | POA: Diagnosis not present

## 2017-06-03 DIAGNOSIS — D509 Iron deficiency anemia, unspecified: Secondary | ICD-10-CM | POA: Diagnosis not present

## 2017-06-05 DIAGNOSIS — N2581 Secondary hyperparathyroidism of renal origin: Secondary | ICD-10-CM | POA: Diagnosis not present

## 2017-06-05 DIAGNOSIS — D509 Iron deficiency anemia, unspecified: Secondary | ICD-10-CM | POA: Diagnosis not present

## 2017-06-05 DIAGNOSIS — N186 End stage renal disease: Secondary | ICD-10-CM | POA: Diagnosis not present

## 2017-06-08 DIAGNOSIS — D509 Iron deficiency anemia, unspecified: Secondary | ICD-10-CM | POA: Diagnosis not present

## 2017-06-08 DIAGNOSIS — N186 End stage renal disease: Secondary | ICD-10-CM | POA: Diagnosis not present

## 2017-06-08 DIAGNOSIS — N2581 Secondary hyperparathyroidism of renal origin: Secondary | ICD-10-CM | POA: Diagnosis not present

## 2017-06-10 DIAGNOSIS — D509 Iron deficiency anemia, unspecified: Secondary | ICD-10-CM | POA: Diagnosis not present

## 2017-06-10 DIAGNOSIS — N186 End stage renal disease: Secondary | ICD-10-CM | POA: Diagnosis not present

## 2017-06-10 DIAGNOSIS — N2581 Secondary hyperparathyroidism of renal origin: Secondary | ICD-10-CM | POA: Diagnosis not present

## 2017-06-12 DIAGNOSIS — N2581 Secondary hyperparathyroidism of renal origin: Secondary | ICD-10-CM | POA: Diagnosis not present

## 2017-06-12 DIAGNOSIS — D509 Iron deficiency anemia, unspecified: Secondary | ICD-10-CM | POA: Diagnosis not present

## 2017-06-12 DIAGNOSIS — N186 End stage renal disease: Secondary | ICD-10-CM | POA: Diagnosis not present

## 2017-06-13 DIAGNOSIS — I120 Hypertensive chronic kidney disease with stage 5 chronic kidney disease or end stage renal disease: Secondary | ICD-10-CM | POA: Diagnosis not present

## 2017-06-13 DIAGNOSIS — Z992 Dependence on renal dialysis: Secondary | ICD-10-CM | POA: Diagnosis not present

## 2017-06-13 DIAGNOSIS — N186 End stage renal disease: Secondary | ICD-10-CM | POA: Diagnosis not present

## 2017-06-15 DIAGNOSIS — N2581 Secondary hyperparathyroidism of renal origin: Secondary | ICD-10-CM | POA: Diagnosis not present

## 2017-06-15 DIAGNOSIS — N186 End stage renal disease: Secondary | ICD-10-CM | POA: Diagnosis not present

## 2017-06-15 DIAGNOSIS — D509 Iron deficiency anemia, unspecified: Secondary | ICD-10-CM | POA: Diagnosis not present

## 2017-06-17 DIAGNOSIS — N2581 Secondary hyperparathyroidism of renal origin: Secondary | ICD-10-CM | POA: Diagnosis not present

## 2017-06-17 DIAGNOSIS — N186 End stage renal disease: Secondary | ICD-10-CM | POA: Diagnosis not present

## 2017-06-17 DIAGNOSIS — D509 Iron deficiency anemia, unspecified: Secondary | ICD-10-CM | POA: Diagnosis not present

## 2017-06-19 DIAGNOSIS — D509 Iron deficiency anemia, unspecified: Secondary | ICD-10-CM | POA: Diagnosis not present

## 2017-06-19 DIAGNOSIS — N186 End stage renal disease: Secondary | ICD-10-CM | POA: Diagnosis not present

## 2017-06-19 DIAGNOSIS — N2581 Secondary hyperparathyroidism of renal origin: Secondary | ICD-10-CM | POA: Diagnosis not present

## 2017-06-22 DIAGNOSIS — N186 End stage renal disease: Secondary | ICD-10-CM | POA: Diagnosis not present

## 2017-06-22 DIAGNOSIS — N2581 Secondary hyperparathyroidism of renal origin: Secondary | ICD-10-CM | POA: Diagnosis not present

## 2017-06-22 DIAGNOSIS — D509 Iron deficiency anemia, unspecified: Secondary | ICD-10-CM | POA: Diagnosis not present

## 2017-06-24 DIAGNOSIS — N2581 Secondary hyperparathyroidism of renal origin: Secondary | ICD-10-CM | POA: Diagnosis not present

## 2017-06-24 DIAGNOSIS — N186 End stage renal disease: Secondary | ICD-10-CM | POA: Diagnosis not present

## 2017-06-24 DIAGNOSIS — D509 Iron deficiency anemia, unspecified: Secondary | ICD-10-CM | POA: Diagnosis not present

## 2017-06-26 DIAGNOSIS — N2581 Secondary hyperparathyroidism of renal origin: Secondary | ICD-10-CM | POA: Diagnosis not present

## 2017-06-26 DIAGNOSIS — N186 End stage renal disease: Secondary | ICD-10-CM | POA: Diagnosis not present

## 2017-06-26 DIAGNOSIS — D509 Iron deficiency anemia, unspecified: Secondary | ICD-10-CM | POA: Diagnosis not present

## 2017-06-29 DIAGNOSIS — N186 End stage renal disease: Secondary | ICD-10-CM | POA: Diagnosis not present

## 2017-06-29 DIAGNOSIS — N2581 Secondary hyperparathyroidism of renal origin: Secondary | ICD-10-CM | POA: Diagnosis not present

## 2017-06-29 DIAGNOSIS — D509 Iron deficiency anemia, unspecified: Secondary | ICD-10-CM | POA: Diagnosis not present

## 2017-07-01 DIAGNOSIS — N186 End stage renal disease: Secondary | ICD-10-CM | POA: Diagnosis not present

## 2017-07-01 DIAGNOSIS — N2581 Secondary hyperparathyroidism of renal origin: Secondary | ICD-10-CM | POA: Diagnosis not present

## 2017-07-01 DIAGNOSIS — D509 Iron deficiency anemia, unspecified: Secondary | ICD-10-CM | POA: Diagnosis not present

## 2017-07-02 DIAGNOSIS — G2589 Other specified extrapyramidal and movement disorders: Secondary | ICD-10-CM | POA: Diagnosis not present

## 2017-07-02 DIAGNOSIS — F419 Anxiety disorder, unspecified: Secondary | ICD-10-CM | POA: Diagnosis not present

## 2017-07-02 DIAGNOSIS — Z13220 Encounter for screening for lipoid disorders: Secondary | ICD-10-CM | POA: Diagnosis not present

## 2017-07-02 DIAGNOSIS — Z125 Encounter for screening for malignant neoplasm of prostate: Secondary | ICD-10-CM | POA: Diagnosis not present

## 2017-07-02 DIAGNOSIS — Z131 Encounter for screening for diabetes mellitus: Secondary | ICD-10-CM | POA: Diagnosis not present

## 2017-07-02 DIAGNOSIS — I509 Heart failure, unspecified: Secondary | ICD-10-CM | POA: Diagnosis not present

## 2017-07-02 DIAGNOSIS — I10 Essential (primary) hypertension: Secondary | ICD-10-CM | POA: Diagnosis not present

## 2017-07-02 DIAGNOSIS — N186 End stage renal disease: Secondary | ICD-10-CM | POA: Diagnosis not present

## 2017-07-03 DIAGNOSIS — N186 End stage renal disease: Secondary | ICD-10-CM | POA: Diagnosis not present

## 2017-07-03 DIAGNOSIS — N2581 Secondary hyperparathyroidism of renal origin: Secondary | ICD-10-CM | POA: Diagnosis not present

## 2017-07-03 DIAGNOSIS — D509 Iron deficiency anemia, unspecified: Secondary | ICD-10-CM | POA: Diagnosis not present

## 2017-07-06 DIAGNOSIS — D509 Iron deficiency anemia, unspecified: Secondary | ICD-10-CM | POA: Diagnosis not present

## 2017-07-06 DIAGNOSIS — N186 End stage renal disease: Secondary | ICD-10-CM | POA: Diagnosis not present

## 2017-07-06 DIAGNOSIS — N2581 Secondary hyperparathyroidism of renal origin: Secondary | ICD-10-CM | POA: Diagnosis not present

## 2017-07-10 DIAGNOSIS — N186 End stage renal disease: Secondary | ICD-10-CM | POA: Diagnosis not present

## 2017-07-10 DIAGNOSIS — D509 Iron deficiency anemia, unspecified: Secondary | ICD-10-CM | POA: Diagnosis not present

## 2017-07-10 DIAGNOSIS — N2581 Secondary hyperparathyroidism of renal origin: Secondary | ICD-10-CM | POA: Diagnosis not present

## 2017-07-13 DIAGNOSIS — N186 End stage renal disease: Secondary | ICD-10-CM | POA: Diagnosis not present

## 2017-07-13 DIAGNOSIS — D509 Iron deficiency anemia, unspecified: Secondary | ICD-10-CM | POA: Diagnosis not present

## 2017-07-13 DIAGNOSIS — N2581 Secondary hyperparathyroidism of renal origin: Secondary | ICD-10-CM | POA: Diagnosis not present

## 2017-07-14 DIAGNOSIS — N186 End stage renal disease: Secondary | ICD-10-CM | POA: Diagnosis not present

## 2017-07-14 DIAGNOSIS — Z992 Dependence on renal dialysis: Secondary | ICD-10-CM | POA: Diagnosis not present

## 2017-07-14 DIAGNOSIS — I120 Hypertensive chronic kidney disease with stage 5 chronic kidney disease or end stage renal disease: Secondary | ICD-10-CM | POA: Diagnosis not present

## 2017-07-17 DIAGNOSIS — Z23 Encounter for immunization: Secondary | ICD-10-CM | POA: Diagnosis not present

## 2017-07-17 DIAGNOSIS — N2581 Secondary hyperparathyroidism of renal origin: Secondary | ICD-10-CM | POA: Diagnosis not present

## 2017-07-17 DIAGNOSIS — N186 End stage renal disease: Secondary | ICD-10-CM | POA: Diagnosis not present

## 2017-07-17 DIAGNOSIS — D509 Iron deficiency anemia, unspecified: Secondary | ICD-10-CM | POA: Diagnosis not present

## 2017-07-20 DIAGNOSIS — D509 Iron deficiency anemia, unspecified: Secondary | ICD-10-CM | POA: Diagnosis not present

## 2017-07-20 DIAGNOSIS — N186 End stage renal disease: Secondary | ICD-10-CM | POA: Diagnosis not present

## 2017-07-20 DIAGNOSIS — N2581 Secondary hyperparathyroidism of renal origin: Secondary | ICD-10-CM | POA: Diagnosis not present

## 2017-07-20 DIAGNOSIS — Z23 Encounter for immunization: Secondary | ICD-10-CM | POA: Diagnosis not present

## 2017-07-22 DIAGNOSIS — Z23 Encounter for immunization: Secondary | ICD-10-CM | POA: Diagnosis not present

## 2017-07-22 DIAGNOSIS — N186 End stage renal disease: Secondary | ICD-10-CM | POA: Diagnosis not present

## 2017-07-22 DIAGNOSIS — D509 Iron deficiency anemia, unspecified: Secondary | ICD-10-CM | POA: Diagnosis not present

## 2017-07-22 DIAGNOSIS — N2581 Secondary hyperparathyroidism of renal origin: Secondary | ICD-10-CM | POA: Diagnosis not present

## 2017-07-24 DIAGNOSIS — N186 End stage renal disease: Secondary | ICD-10-CM | POA: Diagnosis not present

## 2017-07-24 DIAGNOSIS — D509 Iron deficiency anemia, unspecified: Secondary | ICD-10-CM | POA: Diagnosis not present

## 2017-07-24 DIAGNOSIS — Z23 Encounter for immunization: Secondary | ICD-10-CM | POA: Diagnosis not present

## 2017-07-24 DIAGNOSIS — N2581 Secondary hyperparathyroidism of renal origin: Secondary | ICD-10-CM | POA: Diagnosis not present

## 2017-07-27 DIAGNOSIS — D509 Iron deficiency anemia, unspecified: Secondary | ICD-10-CM | POA: Diagnosis not present

## 2017-07-27 DIAGNOSIS — N186 End stage renal disease: Secondary | ICD-10-CM | POA: Diagnosis not present

## 2017-07-27 DIAGNOSIS — Z23 Encounter for immunization: Secondary | ICD-10-CM | POA: Diagnosis not present

## 2017-07-27 DIAGNOSIS — N2581 Secondary hyperparathyroidism of renal origin: Secondary | ICD-10-CM | POA: Diagnosis not present

## 2017-07-29 DIAGNOSIS — N2581 Secondary hyperparathyroidism of renal origin: Secondary | ICD-10-CM | POA: Diagnosis not present

## 2017-07-29 DIAGNOSIS — D509 Iron deficiency anemia, unspecified: Secondary | ICD-10-CM | POA: Diagnosis not present

## 2017-07-29 DIAGNOSIS — N186 End stage renal disease: Secondary | ICD-10-CM | POA: Diagnosis not present

## 2017-07-29 DIAGNOSIS — Z23 Encounter for immunization: Secondary | ICD-10-CM | POA: Diagnosis not present

## 2017-07-31 DIAGNOSIS — N186 End stage renal disease: Secondary | ICD-10-CM | POA: Diagnosis not present

## 2017-07-31 DIAGNOSIS — Z23 Encounter for immunization: Secondary | ICD-10-CM | POA: Diagnosis not present

## 2017-07-31 DIAGNOSIS — D509 Iron deficiency anemia, unspecified: Secondary | ICD-10-CM | POA: Diagnosis not present

## 2017-07-31 DIAGNOSIS — N2581 Secondary hyperparathyroidism of renal origin: Secondary | ICD-10-CM | POA: Diagnosis not present

## 2017-08-03 DIAGNOSIS — N186 End stage renal disease: Secondary | ICD-10-CM | POA: Diagnosis not present

## 2017-08-03 DIAGNOSIS — Z23 Encounter for immunization: Secondary | ICD-10-CM | POA: Diagnosis not present

## 2017-08-03 DIAGNOSIS — N2581 Secondary hyperparathyroidism of renal origin: Secondary | ICD-10-CM | POA: Diagnosis not present

## 2017-08-03 DIAGNOSIS — D509 Iron deficiency anemia, unspecified: Secondary | ICD-10-CM | POA: Diagnosis not present

## 2017-08-05 DIAGNOSIS — D509 Iron deficiency anemia, unspecified: Secondary | ICD-10-CM | POA: Diagnosis not present

## 2017-08-05 DIAGNOSIS — Z23 Encounter for immunization: Secondary | ICD-10-CM | POA: Diagnosis not present

## 2017-08-05 DIAGNOSIS — N186 End stage renal disease: Secondary | ICD-10-CM | POA: Diagnosis not present

## 2017-08-05 DIAGNOSIS — N2581 Secondary hyperparathyroidism of renal origin: Secondary | ICD-10-CM | POA: Diagnosis not present

## 2017-08-07 DIAGNOSIS — N2581 Secondary hyperparathyroidism of renal origin: Secondary | ICD-10-CM | POA: Diagnosis not present

## 2017-08-07 DIAGNOSIS — N186 End stage renal disease: Secondary | ICD-10-CM | POA: Diagnosis not present

## 2017-08-07 DIAGNOSIS — D509 Iron deficiency anemia, unspecified: Secondary | ICD-10-CM | POA: Diagnosis not present

## 2017-08-07 DIAGNOSIS — Z23 Encounter for immunization: Secondary | ICD-10-CM | POA: Diagnosis not present

## 2017-08-10 DIAGNOSIS — N186 End stage renal disease: Secondary | ICD-10-CM | POA: Diagnosis not present

## 2017-08-10 DIAGNOSIS — Z23 Encounter for immunization: Secondary | ICD-10-CM | POA: Diagnosis not present

## 2017-08-10 DIAGNOSIS — D509 Iron deficiency anemia, unspecified: Secondary | ICD-10-CM | POA: Diagnosis not present

## 2017-08-10 DIAGNOSIS — N2581 Secondary hyperparathyroidism of renal origin: Secondary | ICD-10-CM | POA: Diagnosis not present

## 2017-08-12 DIAGNOSIS — D509 Iron deficiency anemia, unspecified: Secondary | ICD-10-CM | POA: Diagnosis not present

## 2017-08-12 DIAGNOSIS — Z23 Encounter for immunization: Secondary | ICD-10-CM | POA: Diagnosis not present

## 2017-08-12 DIAGNOSIS — N186 End stage renal disease: Secondary | ICD-10-CM | POA: Diagnosis not present

## 2017-08-12 DIAGNOSIS — N2581 Secondary hyperparathyroidism of renal origin: Secondary | ICD-10-CM | POA: Diagnosis not present

## 2017-08-14 DIAGNOSIS — Z992 Dependence on renal dialysis: Secondary | ICD-10-CM | POA: Diagnosis not present

## 2017-08-14 DIAGNOSIS — N186 End stage renal disease: Secondary | ICD-10-CM | POA: Diagnosis not present

## 2017-08-14 DIAGNOSIS — Z23 Encounter for immunization: Secondary | ICD-10-CM | POA: Diagnosis not present

## 2017-08-14 DIAGNOSIS — N2581 Secondary hyperparathyroidism of renal origin: Secondary | ICD-10-CM | POA: Diagnosis not present

## 2017-08-14 DIAGNOSIS — D509 Iron deficiency anemia, unspecified: Secondary | ICD-10-CM | POA: Diagnosis not present

## 2017-08-14 DIAGNOSIS — I120 Hypertensive chronic kidney disease with stage 5 chronic kidney disease or end stage renal disease: Secondary | ICD-10-CM | POA: Diagnosis not present

## 2017-08-17 DIAGNOSIS — N2581 Secondary hyperparathyroidism of renal origin: Secondary | ICD-10-CM | POA: Diagnosis not present

## 2017-08-17 DIAGNOSIS — N186 End stage renal disease: Secondary | ICD-10-CM | POA: Diagnosis not present

## 2017-08-17 DIAGNOSIS — D509 Iron deficiency anemia, unspecified: Secondary | ICD-10-CM | POA: Diagnosis not present

## 2017-08-19 DIAGNOSIS — D509 Iron deficiency anemia, unspecified: Secondary | ICD-10-CM | POA: Diagnosis not present

## 2017-08-19 DIAGNOSIS — N186 End stage renal disease: Secondary | ICD-10-CM | POA: Diagnosis not present

## 2017-08-19 DIAGNOSIS — N2581 Secondary hyperparathyroidism of renal origin: Secondary | ICD-10-CM | POA: Diagnosis not present

## 2017-08-21 DIAGNOSIS — N2581 Secondary hyperparathyroidism of renal origin: Secondary | ICD-10-CM | POA: Diagnosis not present

## 2017-08-21 DIAGNOSIS — N186 End stage renal disease: Secondary | ICD-10-CM | POA: Diagnosis not present

## 2017-08-21 DIAGNOSIS — D509 Iron deficiency anemia, unspecified: Secondary | ICD-10-CM | POA: Diagnosis not present

## 2017-08-24 DIAGNOSIS — N2581 Secondary hyperparathyroidism of renal origin: Secondary | ICD-10-CM | POA: Diagnosis not present

## 2017-08-24 DIAGNOSIS — D509 Iron deficiency anemia, unspecified: Secondary | ICD-10-CM | POA: Diagnosis not present

## 2017-08-24 DIAGNOSIS — N186 End stage renal disease: Secondary | ICD-10-CM | POA: Diagnosis not present

## 2017-08-26 DIAGNOSIS — N2581 Secondary hyperparathyroidism of renal origin: Secondary | ICD-10-CM | POA: Diagnosis not present

## 2017-08-26 DIAGNOSIS — N186 End stage renal disease: Secondary | ICD-10-CM | POA: Diagnosis not present

## 2017-08-26 DIAGNOSIS — D509 Iron deficiency anemia, unspecified: Secondary | ICD-10-CM | POA: Diagnosis not present

## 2017-08-28 DIAGNOSIS — D509 Iron deficiency anemia, unspecified: Secondary | ICD-10-CM | POA: Diagnosis not present

## 2017-08-28 DIAGNOSIS — N186 End stage renal disease: Secondary | ICD-10-CM | POA: Diagnosis not present

## 2017-08-28 DIAGNOSIS — N2581 Secondary hyperparathyroidism of renal origin: Secondary | ICD-10-CM | POA: Diagnosis not present

## 2017-08-31 DIAGNOSIS — N186 End stage renal disease: Secondary | ICD-10-CM | POA: Diagnosis not present

## 2017-08-31 DIAGNOSIS — D509 Iron deficiency anemia, unspecified: Secondary | ICD-10-CM | POA: Diagnosis not present

## 2017-08-31 DIAGNOSIS — N2581 Secondary hyperparathyroidism of renal origin: Secondary | ICD-10-CM | POA: Diagnosis not present

## 2017-09-02 DIAGNOSIS — N186 End stage renal disease: Secondary | ICD-10-CM | POA: Diagnosis not present

## 2017-09-02 DIAGNOSIS — D509 Iron deficiency anemia, unspecified: Secondary | ICD-10-CM | POA: Diagnosis not present

## 2017-09-02 DIAGNOSIS — N2581 Secondary hyperparathyroidism of renal origin: Secondary | ICD-10-CM | POA: Diagnosis not present

## 2017-09-04 DIAGNOSIS — N186 End stage renal disease: Secondary | ICD-10-CM | POA: Diagnosis not present

## 2017-09-04 DIAGNOSIS — D509 Iron deficiency anemia, unspecified: Secondary | ICD-10-CM | POA: Diagnosis not present

## 2017-09-04 DIAGNOSIS — N2581 Secondary hyperparathyroidism of renal origin: Secondary | ICD-10-CM | POA: Diagnosis not present

## 2017-09-07 DIAGNOSIS — N186 End stage renal disease: Secondary | ICD-10-CM | POA: Diagnosis not present

## 2017-09-07 DIAGNOSIS — N2581 Secondary hyperparathyroidism of renal origin: Secondary | ICD-10-CM | POA: Diagnosis not present

## 2017-09-07 DIAGNOSIS — D509 Iron deficiency anemia, unspecified: Secondary | ICD-10-CM | POA: Diagnosis not present

## 2017-09-09 DIAGNOSIS — N2581 Secondary hyperparathyroidism of renal origin: Secondary | ICD-10-CM | POA: Diagnosis not present

## 2017-09-09 DIAGNOSIS — N186 End stage renal disease: Secondary | ICD-10-CM | POA: Diagnosis not present

## 2017-09-09 DIAGNOSIS — D509 Iron deficiency anemia, unspecified: Secondary | ICD-10-CM | POA: Diagnosis not present

## 2017-09-11 DIAGNOSIS — N186 End stage renal disease: Secondary | ICD-10-CM | POA: Diagnosis not present

## 2017-09-11 DIAGNOSIS — N2581 Secondary hyperparathyroidism of renal origin: Secondary | ICD-10-CM | POA: Diagnosis not present

## 2017-09-11 DIAGNOSIS — D509 Iron deficiency anemia, unspecified: Secondary | ICD-10-CM | POA: Diagnosis not present

## 2017-09-13 DIAGNOSIS — Z992 Dependence on renal dialysis: Secondary | ICD-10-CM | POA: Diagnosis not present

## 2017-09-13 DIAGNOSIS — N186 End stage renal disease: Secondary | ICD-10-CM | POA: Diagnosis not present

## 2017-09-13 DIAGNOSIS — I120 Hypertensive chronic kidney disease with stage 5 chronic kidney disease or end stage renal disease: Secondary | ICD-10-CM | POA: Diagnosis not present

## 2017-09-14 DIAGNOSIS — D509 Iron deficiency anemia, unspecified: Secondary | ICD-10-CM | POA: Diagnosis not present

## 2017-09-14 DIAGNOSIS — N2581 Secondary hyperparathyroidism of renal origin: Secondary | ICD-10-CM | POA: Diagnosis not present

## 2017-09-14 DIAGNOSIS — N186 End stage renal disease: Secondary | ICD-10-CM | POA: Diagnosis not present

## 2017-09-16 DIAGNOSIS — N2581 Secondary hyperparathyroidism of renal origin: Secondary | ICD-10-CM | POA: Diagnosis not present

## 2017-09-16 DIAGNOSIS — N186 End stage renal disease: Secondary | ICD-10-CM | POA: Diagnosis not present

## 2017-09-16 DIAGNOSIS — D509 Iron deficiency anemia, unspecified: Secondary | ICD-10-CM | POA: Diagnosis not present

## 2017-09-18 DIAGNOSIS — N2581 Secondary hyperparathyroidism of renal origin: Secondary | ICD-10-CM | POA: Diagnosis not present

## 2017-09-18 DIAGNOSIS — D509 Iron deficiency anemia, unspecified: Secondary | ICD-10-CM | POA: Diagnosis not present

## 2017-09-18 DIAGNOSIS — N186 End stage renal disease: Secondary | ICD-10-CM | POA: Diagnosis not present

## 2017-09-21 DIAGNOSIS — D509 Iron deficiency anemia, unspecified: Secondary | ICD-10-CM | POA: Diagnosis not present

## 2017-09-21 DIAGNOSIS — N2581 Secondary hyperparathyroidism of renal origin: Secondary | ICD-10-CM | POA: Diagnosis not present

## 2017-09-21 DIAGNOSIS — N186 End stage renal disease: Secondary | ICD-10-CM | POA: Diagnosis not present

## 2017-09-23 DIAGNOSIS — D509 Iron deficiency anemia, unspecified: Secondary | ICD-10-CM | POA: Diagnosis not present

## 2017-09-23 DIAGNOSIS — N186 End stage renal disease: Secondary | ICD-10-CM | POA: Diagnosis not present

## 2017-09-23 DIAGNOSIS — N2581 Secondary hyperparathyroidism of renal origin: Secondary | ICD-10-CM | POA: Diagnosis not present

## 2017-09-25 DIAGNOSIS — N2581 Secondary hyperparathyroidism of renal origin: Secondary | ICD-10-CM | POA: Diagnosis not present

## 2017-09-25 DIAGNOSIS — D509 Iron deficiency anemia, unspecified: Secondary | ICD-10-CM | POA: Diagnosis not present

## 2017-09-25 DIAGNOSIS — N186 End stage renal disease: Secondary | ICD-10-CM | POA: Diagnosis not present

## 2017-09-28 DIAGNOSIS — N186 End stage renal disease: Secondary | ICD-10-CM | POA: Diagnosis not present

## 2017-09-28 DIAGNOSIS — N2581 Secondary hyperparathyroidism of renal origin: Secondary | ICD-10-CM | POA: Diagnosis not present

## 2017-09-28 DIAGNOSIS — D509 Iron deficiency anemia, unspecified: Secondary | ICD-10-CM | POA: Diagnosis not present

## 2017-09-30 DIAGNOSIS — D509 Iron deficiency anemia, unspecified: Secondary | ICD-10-CM | POA: Diagnosis not present

## 2017-09-30 DIAGNOSIS — N186 End stage renal disease: Secondary | ICD-10-CM | POA: Diagnosis not present

## 2017-09-30 DIAGNOSIS — N2581 Secondary hyperparathyroidism of renal origin: Secondary | ICD-10-CM | POA: Diagnosis not present

## 2017-10-02 DIAGNOSIS — N2581 Secondary hyperparathyroidism of renal origin: Secondary | ICD-10-CM | POA: Diagnosis not present

## 2017-10-02 DIAGNOSIS — N186 End stage renal disease: Secondary | ICD-10-CM | POA: Diagnosis not present

## 2017-10-02 DIAGNOSIS — D509 Iron deficiency anemia, unspecified: Secondary | ICD-10-CM | POA: Diagnosis not present

## 2017-10-05 DIAGNOSIS — N2581 Secondary hyperparathyroidism of renal origin: Secondary | ICD-10-CM | POA: Diagnosis not present

## 2017-10-05 DIAGNOSIS — N186 End stage renal disease: Secondary | ICD-10-CM | POA: Diagnosis not present

## 2017-10-05 DIAGNOSIS — D509 Iron deficiency anemia, unspecified: Secondary | ICD-10-CM | POA: Diagnosis not present

## 2017-10-07 DIAGNOSIS — D509 Iron deficiency anemia, unspecified: Secondary | ICD-10-CM | POA: Diagnosis not present

## 2017-10-07 DIAGNOSIS — N186 End stage renal disease: Secondary | ICD-10-CM | POA: Diagnosis not present

## 2017-10-07 DIAGNOSIS — N2581 Secondary hyperparathyroidism of renal origin: Secondary | ICD-10-CM | POA: Diagnosis not present

## 2017-10-09 DIAGNOSIS — N186 End stage renal disease: Secondary | ICD-10-CM | POA: Diagnosis not present

## 2017-10-09 DIAGNOSIS — N2581 Secondary hyperparathyroidism of renal origin: Secondary | ICD-10-CM | POA: Diagnosis not present

## 2017-10-09 DIAGNOSIS — D509 Iron deficiency anemia, unspecified: Secondary | ICD-10-CM | POA: Diagnosis not present

## 2017-10-12 DIAGNOSIS — D509 Iron deficiency anemia, unspecified: Secondary | ICD-10-CM | POA: Diagnosis not present

## 2017-10-12 DIAGNOSIS — N186 End stage renal disease: Secondary | ICD-10-CM | POA: Diagnosis not present

## 2017-10-12 DIAGNOSIS — N2581 Secondary hyperparathyroidism of renal origin: Secondary | ICD-10-CM | POA: Diagnosis not present

## 2017-10-14 DIAGNOSIS — I120 Hypertensive chronic kidney disease with stage 5 chronic kidney disease or end stage renal disease: Secondary | ICD-10-CM | POA: Diagnosis not present

## 2017-10-14 DIAGNOSIS — D509 Iron deficiency anemia, unspecified: Secondary | ICD-10-CM | POA: Diagnosis not present

## 2017-10-14 DIAGNOSIS — Z992 Dependence on renal dialysis: Secondary | ICD-10-CM | POA: Diagnosis not present

## 2017-10-14 DIAGNOSIS — N2581 Secondary hyperparathyroidism of renal origin: Secondary | ICD-10-CM | POA: Diagnosis not present

## 2017-10-14 DIAGNOSIS — N186 End stage renal disease: Secondary | ICD-10-CM | POA: Diagnosis not present

## 2017-10-16 DIAGNOSIS — N2581 Secondary hyperparathyroidism of renal origin: Secondary | ICD-10-CM | POA: Diagnosis not present

## 2017-10-16 DIAGNOSIS — N186 End stage renal disease: Secondary | ICD-10-CM | POA: Diagnosis not present

## 2017-10-16 DIAGNOSIS — D509 Iron deficiency anemia, unspecified: Secondary | ICD-10-CM | POA: Diagnosis not present

## 2017-10-19 DIAGNOSIS — N2581 Secondary hyperparathyroidism of renal origin: Secondary | ICD-10-CM | POA: Diagnosis not present

## 2017-10-19 DIAGNOSIS — N186 End stage renal disease: Secondary | ICD-10-CM | POA: Diagnosis not present

## 2017-10-19 DIAGNOSIS — D509 Iron deficiency anemia, unspecified: Secondary | ICD-10-CM | POA: Diagnosis not present

## 2017-10-21 DIAGNOSIS — N2581 Secondary hyperparathyroidism of renal origin: Secondary | ICD-10-CM | POA: Diagnosis not present

## 2017-10-21 DIAGNOSIS — D509 Iron deficiency anemia, unspecified: Secondary | ICD-10-CM | POA: Diagnosis not present

## 2017-10-21 DIAGNOSIS — N186 End stage renal disease: Secondary | ICD-10-CM | POA: Diagnosis not present

## 2017-10-23 DIAGNOSIS — N2581 Secondary hyperparathyroidism of renal origin: Secondary | ICD-10-CM | POA: Diagnosis not present

## 2017-10-23 DIAGNOSIS — D509 Iron deficiency anemia, unspecified: Secondary | ICD-10-CM | POA: Diagnosis not present

## 2017-10-23 DIAGNOSIS — N186 End stage renal disease: Secondary | ICD-10-CM | POA: Diagnosis not present

## 2017-10-26 DIAGNOSIS — N186 End stage renal disease: Secondary | ICD-10-CM | POA: Diagnosis not present

## 2017-10-26 DIAGNOSIS — D509 Iron deficiency anemia, unspecified: Secondary | ICD-10-CM | POA: Diagnosis not present

## 2017-10-26 DIAGNOSIS — N2581 Secondary hyperparathyroidism of renal origin: Secondary | ICD-10-CM | POA: Diagnosis not present

## 2017-10-28 DIAGNOSIS — N186 End stage renal disease: Secondary | ICD-10-CM | POA: Diagnosis not present

## 2017-10-28 DIAGNOSIS — D509 Iron deficiency anemia, unspecified: Secondary | ICD-10-CM | POA: Diagnosis not present

## 2017-10-28 DIAGNOSIS — N2581 Secondary hyperparathyroidism of renal origin: Secondary | ICD-10-CM | POA: Diagnosis not present

## 2017-10-30 DIAGNOSIS — N186 End stage renal disease: Secondary | ICD-10-CM | POA: Diagnosis not present

## 2017-10-30 DIAGNOSIS — D509 Iron deficiency anemia, unspecified: Secondary | ICD-10-CM | POA: Diagnosis not present

## 2017-10-30 DIAGNOSIS — N2581 Secondary hyperparathyroidism of renal origin: Secondary | ICD-10-CM | POA: Diagnosis not present

## 2017-11-03 DIAGNOSIS — N2581 Secondary hyperparathyroidism of renal origin: Secondary | ICD-10-CM | POA: Diagnosis not present

## 2017-11-03 DIAGNOSIS — N186 End stage renal disease: Secondary | ICD-10-CM | POA: Diagnosis not present

## 2017-11-03 DIAGNOSIS — D509 Iron deficiency anemia, unspecified: Secondary | ICD-10-CM | POA: Diagnosis not present

## 2017-11-06 DIAGNOSIS — N2581 Secondary hyperparathyroidism of renal origin: Secondary | ICD-10-CM | POA: Diagnosis not present

## 2017-11-06 DIAGNOSIS — N186 End stage renal disease: Secondary | ICD-10-CM | POA: Diagnosis not present

## 2017-11-06 DIAGNOSIS — D509 Iron deficiency anemia, unspecified: Secondary | ICD-10-CM | POA: Diagnosis not present

## 2017-11-09 DIAGNOSIS — D509 Iron deficiency anemia, unspecified: Secondary | ICD-10-CM | POA: Diagnosis not present

## 2017-11-09 DIAGNOSIS — N186 End stage renal disease: Secondary | ICD-10-CM | POA: Diagnosis not present

## 2017-11-09 DIAGNOSIS — N2581 Secondary hyperparathyroidism of renal origin: Secondary | ICD-10-CM | POA: Diagnosis not present

## 2017-11-11 DIAGNOSIS — N186 End stage renal disease: Secondary | ICD-10-CM | POA: Diagnosis not present

## 2017-11-11 DIAGNOSIS — D509 Iron deficiency anemia, unspecified: Secondary | ICD-10-CM | POA: Diagnosis not present

## 2017-11-11 DIAGNOSIS — N2581 Secondary hyperparathyroidism of renal origin: Secondary | ICD-10-CM | POA: Diagnosis not present

## 2017-11-12 ENCOUNTER — Inpatient Hospital Stay (HOSPITAL_COMMUNITY)
Admission: EM | Admit: 2017-11-12 | Discharge: 2017-11-14 | DRG: 193 | Disposition: A | Discharge status: Home / Self Care | Payer: Medicare Other | Attending: Internal Medicine | Admitting: Internal Medicine

## 2017-11-12 ENCOUNTER — Encounter (HOSPITAL_COMMUNITY): Payer: Self-pay

## 2017-11-12 ENCOUNTER — Emergency Department (HOSPITAL_COMMUNITY): Payer: Medicare Other

## 2017-11-12 DIAGNOSIS — Z23 Encounter for immunization: Secondary | ICD-10-CM | POA: Diagnosis not present

## 2017-11-12 DIAGNOSIS — I5042 Chronic combined systolic (congestive) and diastolic (congestive) heart failure: Secondary | ICD-10-CM | POA: Diagnosis present

## 2017-11-12 DIAGNOSIS — J181 Lobar pneumonia, unspecified organism: Secondary | ICD-10-CM

## 2017-11-12 DIAGNOSIS — I428 Other cardiomyopathies: Secondary | ICD-10-CM | POA: Diagnosis not present

## 2017-11-12 DIAGNOSIS — R0602 Shortness of breath: Secondary | ICD-10-CM | POA: Diagnosis not present

## 2017-11-12 DIAGNOSIS — R05 Cough: Secondary | ICD-10-CM | POA: Diagnosis not present

## 2017-11-12 DIAGNOSIS — D649 Anemia, unspecified: Secondary | ICD-10-CM | POA: Diagnosis present

## 2017-11-12 DIAGNOSIS — R748 Abnormal levels of other serum enzymes: Secondary | ICD-10-CM | POA: Diagnosis not present

## 2017-11-12 DIAGNOSIS — I2510 Atherosclerotic heart disease of native coronary artery without angina pectoris: Secondary | ICD-10-CM | POA: Diagnosis present

## 2017-11-12 DIAGNOSIS — I132 Hypertensive heart and chronic kidney disease with heart failure and with stage 5 chronic kidney disease, or end stage renal disease: Secondary | ICD-10-CM | POA: Diagnosis not present

## 2017-11-12 DIAGNOSIS — E889 Metabolic disorder, unspecified: Secondary | ICD-10-CM | POA: Diagnosis present

## 2017-11-12 DIAGNOSIS — Z992 Dependence on renal dialysis: Secondary | ICD-10-CM | POA: Diagnosis not present

## 2017-11-12 DIAGNOSIS — F17210 Nicotine dependence, cigarettes, uncomplicated: Secondary | ICD-10-CM | POA: Diagnosis present

## 2017-11-12 DIAGNOSIS — N2581 Secondary hyperparathyroidism of renal origin: Secondary | ICD-10-CM | POA: Diagnosis present

## 2017-11-12 DIAGNOSIS — G2581 Restless legs syndrome: Secondary | ICD-10-CM | POA: Diagnosis present

## 2017-11-12 DIAGNOSIS — I120 Hypertensive chronic kidney disease with stage 5 chronic kidney disease or end stage renal disease: Secondary | ICD-10-CM | POA: Diagnosis not present

## 2017-11-12 DIAGNOSIS — J189 Pneumonia, unspecified organism: Secondary | ICD-10-CM | POA: Diagnosis not present

## 2017-11-12 DIAGNOSIS — F1910 Other psychoactive substance abuse, uncomplicated: Secondary | ICD-10-CM | POA: Diagnosis present

## 2017-11-12 DIAGNOSIS — F1410 Cocaine abuse, uncomplicated: Secondary | ICD-10-CM | POA: Diagnosis present

## 2017-11-12 DIAGNOSIS — Z888 Allergy status to other drugs, medicaments and biological substances status: Secondary | ICD-10-CM | POA: Diagnosis not present

## 2017-11-12 DIAGNOSIS — N186 End stage renal disease: Secondary | ICD-10-CM | POA: Diagnosis present

## 2017-11-12 HISTORY — DX: Pneumonia, unspecified organism: J18.9

## 2017-11-12 LAB — COMPREHENSIVE METABOLIC PANEL
ALK PHOS: 84 U/L (ref 38–126)
ALT: 15 U/L — ABNORMAL LOW (ref 17–63)
ANION GAP: 11 (ref 5–15)
AST: 20 U/L (ref 15–41)
Albumin: 3.4 g/dL — ABNORMAL LOW (ref 3.5–5.0)
BUN: 43 mg/dL — ABNORMAL HIGH (ref 6–20)
CALCIUM: 9.1 mg/dL (ref 8.9–10.3)
CHLORIDE: 97 mmol/L — AB (ref 101–111)
CO2: 31 mmol/L (ref 22–32)
CREATININE: 9.85 mg/dL — AB (ref 0.61–1.24)
GFR, EST AFRICAN AMERICAN: 6 mL/min — AB (ref >60)
GFR, EST NON AFRICAN AMERICAN: 5 mL/min — AB (ref >60)
Glucose, Bld: 147 mg/dL — ABNORMAL HIGH (ref 65–99)
Potassium: 4.2 mmol/L (ref 3.5–5.1)
SODIUM: 139 mmol/L (ref 135–145)
Total Bilirubin: 1.1 mg/dL (ref 0.3–1.2)
Total Protein: 7.5 g/dL (ref 6.5–8.1)

## 2017-11-12 LAB — I-STAT TROPONIN, ED: TROPONIN I, POC: 0.19 ng/mL — AB (ref 0.00–0.08)

## 2017-11-12 LAB — CBC WITH DIFFERENTIAL/PLATELET
BASOS PCT: 1 %
Basophils Absolute: 0 10*3/uL (ref 0.0–0.1)
EOS ABS: 0.2 10*3/uL (ref 0.0–0.7)
Eosinophils Relative: 4 %
HEMATOCRIT: 40.8 % (ref 39.0–52.0)
HEMOGLOBIN: 13.7 g/dL (ref 13.0–17.0)
Lymphocytes Relative: 27 %
Lymphs Abs: 1.2 10*3/uL (ref 0.7–4.0)
MCH: 32.2 pg (ref 26.0–34.0)
MCHC: 33.6 g/dL (ref 30.0–36.0)
MCV: 96 fL (ref 78.0–100.0)
MONOS PCT: 9 %
Monocytes Absolute: 0.4 10*3/uL (ref 0.1–1.0)
NEUTROS ABS: 2.5 10*3/uL (ref 1.7–7.7)
NEUTROS PCT: 59 %
Platelets: 140 10*3/uL — ABNORMAL LOW (ref 150–400)
RBC: 4.25 MIL/uL (ref 4.22–5.81)
RDW: 16.5 % — ABNORMAL HIGH (ref 11.5–15.5)
WBC: 4.2 10*3/uL (ref 4.0–10.5)

## 2017-11-12 LAB — I-STAT CG4 LACTIC ACID, ED: LACTIC ACID, VENOUS: 1.33 mmol/L (ref 0.5–1.9)

## 2017-11-12 LAB — TROPONIN I: TROPONIN I: 0.19 ng/mL — AB (ref <0.03)

## 2017-11-12 MED ORDER — SODIUM CHLORIDE 0.9% FLUSH
3.0000 mL | Freq: Two times a day (BID) | INTRAVENOUS | Status: DC
  Administered 2017-11-12 – 2017-11-13 (×2): 3 mL via INTRAVENOUS

## 2017-11-12 MED ORDER — CALCIUM ACETATE (PHOS BINDER) 667 MG PO CAPS
1334.0000 mg | ORAL_CAPSULE | Freq: Two times a day (BID) | ORAL | Status: DC | PRN
  Filled 2017-11-12: qty 2

## 2017-11-12 MED ORDER — HEPARIN SODIUM (PORCINE) 5000 UNIT/ML IJ SOLN: 5000.0000 [IU] | Freq: Three times a day (TID) | INTRAMUSCULAR | Status: DC

## 2017-11-12 MED ORDER — DEXTROSE 5 % IV SOLN
1.0000 g | INTRAVENOUS | Status: DC
  Administered 2017-11-13: 1 g via INTRAVENOUS
  Filled 2017-11-12 (×2): qty 10

## 2017-11-12 MED ORDER — CLONAZEPAM 0.5 MG PO TABS
0.5000 mg | ORAL_TABLET | ORAL | Status: DC | PRN
  Administered 2017-11-13 (×2): 0.5 mg via ORAL
  Filled 2017-11-12 (×2): qty 1

## 2017-11-12 MED ORDER — AZITHROMYCIN 250 MG PO TABS
500.0000 mg | ORAL_TABLET | Freq: Once | ORAL | Status: AC
  Administered 2017-11-12: 500 mg via ORAL
  Filled 2017-11-12: qty 2

## 2017-11-12 MED ORDER — AZITHROMYCIN 500 MG PO TABS
500.0000 mg | ORAL_TABLET | Freq: Every day | ORAL | Status: DC
  Administered 2017-11-13 – 2017-11-14 (×2): 500 mg via ORAL
  Filled 2017-11-12 (×2): qty 1

## 2017-11-12 MED ORDER — CALCIUM ACETATE (PHOS BINDER) 667 MG PO CAPS
2001.0000 mg | ORAL_CAPSULE | Freq: Three times a day (TID) | ORAL | Status: DC
  Administered 2017-11-13 – 2017-11-14 (×4): 2001 mg via ORAL
  Filled 2017-11-12 (×5): qty 3

## 2017-11-12 MED ORDER — AZITHROMYCIN 250 MG PO TABS: 500.0000 mg | ORAL_TABLET | ORAL | Status: DC

## 2017-11-12 MED ORDER — SODIUM CHLORIDE 0.9% FLUSH: 3.0000 mL | INTRAVENOUS | Status: DC | PRN

## 2017-11-12 MED ORDER — CALCIUM ACETATE 667 MG PO CAPS: 1334.0000 mg | ORAL_CAPSULE | ORAL | Status: DC

## 2017-11-12 MED ORDER — HYDRALAZINE HCL 20 MG/ML IJ SOLN: 5.0000 mg | Freq: Three times a day (TID) | INTRAMUSCULAR | Status: DC | PRN

## 2017-11-12 MED ORDER — DEXTROSE 5 % IV SOLN: 1.0000 g | INTRAVENOUS | Status: DC

## 2017-11-12 MED ORDER — IPRATROPIUM-ALBUTEROL 0.5-2.5 (3) MG/3ML IN SOLN
3.0000 mL | RESPIRATORY_TRACT | Status: DC | PRN
  Administered 2017-11-12: 3 mL via RESPIRATORY_TRACT
  Filled 2017-11-12: qty 3

## 2017-11-12 MED ORDER — ISOSORBIDE MONONITRATE ER 60 MG PO TB24
60.0000 mg | ORAL_TABLET | Freq: Every day | ORAL | Status: DC
  Administered 2017-11-13 – 2017-11-14 (×2): 60 mg via ORAL
  Filled 2017-11-12 (×2): qty 1

## 2017-11-12 MED ORDER — ADULT MULTIVITAMIN W/MINERALS CH
1.0000 | ORAL_TABLET | Freq: Every day | ORAL | Status: DC
  Administered 2017-11-13 – 2017-11-14 (×2): 1 via ORAL
  Filled 2017-11-12 (×2): qty 1

## 2017-11-12 MED ORDER — IPRATROPIUM-ALBUTEROL 0.5-2.5 (3) MG/3ML IN SOLN
3.0000 mL | Freq: Four times a day (QID) | RESPIRATORY_TRACT | Status: DC
  Administered 2017-11-12 – 2017-11-13 (×4): 3 mL via RESPIRATORY_TRACT
  Filled 2017-11-12 (×6): qty 3

## 2017-11-12 MED ORDER — CARVEDILOL 12.5 MG PO TABS: 12.5000 mg | ORAL_TABLET | Freq: Two times a day (BID) | ORAL | Status: DC

## 2017-11-12 MED ORDER — SODIUM CHLORIDE 0.9 % IV SOLN: 250.0000 mL | INTRAVENOUS | Status: DC | PRN

## 2017-11-12 MED ORDER — HEPARIN SODIUM (PORCINE) 5000 UNIT/ML IJ SOLN
5000.0000 [IU] | Freq: Three times a day (TID) | INTRAMUSCULAR | Status: DC
  Administered 2017-11-12 – 2017-11-14 (×4): 5000 [IU] via SUBCUTANEOUS
  Filled 2017-11-12 (×4): qty 1

## 2017-11-12 MED ORDER — DEXTROSE 5 % IV SOLN
1.0000 g | Freq: Once | INTRAVENOUS | Status: AC
  Administered 2017-11-12: 1 g via INTRAVENOUS
  Filled 2017-11-12: qty 10

## 2017-11-12 MED ORDER — OFLOXACIN 0.3 % OP SOLN
1.0000 [drp] | Freq: Four times a day (QID) | OPHTHALMIC | Status: DC
  Administered 2017-11-12 – 2017-11-14 (×3): 1 [drp] via OPHTHALMIC
  Filled 2017-11-12 (×2): qty 5

## 2017-11-12 NOTE — ED Notes (Signed)
0.19 Trop

## 2017-11-12 NOTE — ED Notes (Signed)
Patient states he is starting to feel bad, like "sick" kind of bad, like a virus.  Temperature taken, 98.7.

## 2017-11-12 NOTE — ED Notes (Signed)
CRITICAL VALUE ALERT  Critical Value:  Troponin 0.19  Date & Time Notied:  11/12/2017 1407   Provider Notified: Dr. Rex Kras  Orders Received/Actions taken: MD advised that pt has chronically elevated troponin. Pt does not need to rush back to room at this time.

## 2017-11-12 NOTE — ED Notes (Signed)
Pt aware of need for urine specimen. Pt does not make much urine.

## 2017-11-12 NOTE — ED Triage Notes (Signed)
Patient complains of 2 weeks of cough and SOB with the cough, states that he now has some epigastric discomfort that he states is sore muscles. Had dialysis yesterday, denies CP. Alert and oriented

## 2017-11-12 NOTE — H&P (Signed)
History and Physical  [name] MWN:[id number] DOB: [birth year] DOA: 11/12/2017  Referring physician: Dr. Wynelle Cleveland, ED PCP: Pa, [place]  Outpatient Specialists: Nephrologist Patient coming from: Home & is able to ambulate independently  Chief Complaint: Nonproductive cough  HPI: [name] [name] is a 51 y.o. male with medical history significant for end-stage renal disease on hemodialysis Monday/Wednesday/Friday, hypertension, nonischemic CAD, polysubstance/tobacco abuse, presents to the ED complaining of nonproductive cough for 2 weeks.  Patient reported associated musculoskeletal sharp chest pain, nonradiating, only when he coughs, reports shortness of breath.  Patient denies any left-sided chest pain, fever/chills, abdominal pain, nausea/vomiting/diarrhea, sore throat, runny nose.  Patient reports sick contacts with his grandkids.  Patient initially thought cough will go away on its own, did not seek any medical attention.  Was advised by his partner to seek medical attention due to unresolving cough.  Of note, patient has a history of polysubstance abuse (marijuana, cocaine, reports smoking a few cigarettes per day).  Chest x-ray showed right middle lobe consolidation consistent with pneumonia.  Due to patient's complex medical history, patient was admitted for community-acquired pneumonia  ED Course: In the ED, chest x-ray showed right middle lobe consolidation, lactic acid within normal limit, afebrile, no leukocytosis, saturating well on room air, troponin was elevated at 0.19, which appears to be his baseline.  EKG shows inverted T waves in the lateral leads similar to his prior EKGs.  No acute ischemic changes.  Patient was started on IV ceftriaxone and azithromycin for community-acquired pneumonia.   Review of Systems: Review of systems are otherwise negative   Past Medical History:  Diagnosis Date  . CHF (congestive heart failure) ([place])   . Combined congestive systolic and  diastolic heart failure ([place]) 04/18/2014   A. Echo 8/13: Severe LVH, EF 40-45%, inferoposterior akinesis, grade 2 diastolic dysfunction, moderate LAE, mild RVE, mildly reduced RVSF, mild RAE; cannot rule out R atrial mass-suggest TEE or cardiac MRI  //  B. Echo 3/17: Mild LVH, EF 25-30%, diffuse HK, grade 2 diastolic dysfunction, mild MR, severe LAE,  moderately reduced RVSF, severe RAE, mild TR, moderate PI, PASP 55 mmHg    . ESRD (end stage renal disease) on dialysis [facility])    MWF- [place] (05/18/2017)  . Hemodialysis patient ([place])    M,W,F  . Hypertension   . Medical non-compliance   . Non-ischemic cardiomyopathy ([place])    A. R/L HC 3/17: Normal coronary arteries, moderate pulmonary hypertension (PASP 65 mmHg), elevated LV filling pressures (LVEDP 45 mmHg)   . NSVT (nonsustained ventricular tachycardia) ([place]) 02/22/2016  . Polysubstance abuse ([place])   . Restless leg syndrome    Past Surgical History:  Procedure Laterality Date  . [place] TRANSPOSITION  01/19/2013   Procedure: BASCILIC VEIN TRANSPOSITION;  Surgeon: Conrad [place], MD;  Location: [place];  Service: Vascular;  Laterality: Left;  left 2nd stage basilic vein transposition  . CARDIAC CATHETERIZATION N/A 02/25/2016   Procedure: Right/Left Heart Cath and Coronary Angiography;  Surgeon: Burnell Blanks, MD;  Location: [place] CV LAB;  Service: Cardiovascular;  Laterality: N/A;  . INGUINAL HERNIA REPAIR Right 08/05/2016   Procedure: RIGHT INGUINAL HERNIA REPAIR WITH MESH;  Surgeon: Donnie Mesa, MD;  Location: [place];  Service: General;  Laterality: Right;  . INSERTION OF DIALYSIS CATHETER  07/17/2012   Procedure: INSERTION OF DIALYSIS CATHETER;  Surgeon: Conrad [place], MD;  Location: [place];  Service: Vascular;  Laterality: Right;  right internal jugular  . INSERTION OF MESH  Right 08/05/2016   Procedure: INSERTION OF MESH;  Surgeon: Donnie Mesa, MD;  Location: [place];  Service: General;  Laterality: Right;  . REVISON OF  ARTERIOVENOUS FISTULA Left 01/06/2017   Procedure: REVISON OF ARTERIOVENOUS FISTULA;  Surgeon: Conrad [place], MD;  Location: [place];  Service: Vascular;  Laterality: Left;  . UMBILICAL HERNIA REPAIR N/A 08/05/2016   Procedure: [place];  Surgeon: Donnie Mesa, MD;  Location: [place];  Service: General;  Laterality: N/A;  . VENOGRAM N/A 08/09/2012   Procedure: VENOGRAM;  Surgeon: Conrad [place], MD;  Location: [facility] CATH LAB;  Service: Cardiovascular;  Laterality: N/A;    Social History:  reports that he has been smoking cigarettes.  He has a 3.00 pack-year smoking history. he has never used smokeless tobacco. He reports that he drinks about 3.6 oz of alcohol per week. He reports that he does not use drugs.   Allergies  Allergen Reactions  . Losartan Cough    Family History  Problem Relation Age of Onset  . Hypertension Mother   . Diabetes Mother   . Hypertension Father     No other family history  Prior to Admission medications   Medication Sig Start Date End Date Taking? Authorizing Provider  acetaminophen (TYLENOL) 325 MG tablet Take 650 mg by mouth every 6 (six) hours as needed for headache (pain).   Yes [provider]  calcium acetate (PHOSLO) 667 MG capsule Take 3 capsules (2,001 mg total) by mouth 3 (three) times daily with meals. Patient taking differently: Take 1,334-3,335 mg by mouth See admin instructions. Take 3-5 capsules (2001 - 3335 mg) by mouth 3 times daily with large meals, take 2 capsules (1334 mg) with snacks 04/21/14  Yes Mikhail, Maryann, DO  carvedilol (COREG) 25 MG tablet Take 1 tablet (25 mg total) by mouth 2 (two) times daily. 05/20/17  Yes Vann, Jessica U, DO  clonazePAM (KLONOPIN) 0.5 MG tablet Take 0.5 mg by mouth as needed. 11/03/17  Yes [provider]  isosorbide mononitrate (IMDUR) 60 MG 24 hr tablet Take 1 tablet (60 mg total) by mouth daily. 05/20/17  Yes Geradine Girt, DO  Multiple Vitamin (MULTIVITAMIN WITH MINERALS)  TABS tablet Take 1 tablet by mouth daily.   Yes [provider]  ofloxacin (OCUFLOX) 0.3 % ophthalmic solution Place 1 drop into the left eye 4 (four) times daily. 11/03/17  Yes [provider]    Physical Exam: BP (!) 145/106   Pulse 61   Temp 98.2 F (36.8 C) (Oral)   Resp 17   SpO2 100%   General: Alert, awake, oriented x3 Eyes: Normal ENT: Normal Neck: Normal Cardiovascular: Heart sounds S1-S2 present, no added heart sound Respiratory: RML/RLL rhonci noted, other clear to auscultation Abdomen: Soft, nontender, nondistended, bowel sounds present Skin: Normal Musculoskeletal: No bilateral pedal edema Psychiatric: Normal mood Neurologic: No focal neurologic deficit          Labs on Admission:  Basic Metabolic Panel: Recent Labs  Lab 11/12/17 1304  NA 139  K 4.2  CL 97*  CO2 31  GLUCOSE 147*  BUN 43*  CREATININE 9.85*  CALCIUM 9.1   Liver Function Tests: Recent Labs  Lab 11/12/17 1304  AST 20  ALT 15*  ALKPHOS 84  BILITOT 1.1  PROT 7.5  ALBUMIN 3.4*   No results for input(s): LIPASE, AMYLASE in the last 168 hours. No results for input(s): AMMONIA in the last 168 hours. CBC: Recent Labs  Lab  11/12/17 1304  WBC 4.2  NEUTROABS 2.5  HGB 13.7  HCT 40.8  MCV 96.0  PLT 140*   Cardiac Enzymes: No results for input(s): CKTOTAL, CKMB, CKMBINDEX, TROPONINI in the last 168 hours.  BNP (last 3 results) No results for input(s): BNP in the last 8760 hours.  ProBNP (last 3 results) No results for input(s): PROBNP in the last 8760 hours.  CBG: No results for input(s): GLUCAP in the last 168 hours.  Radiological Exams on Admission: Dg Chest 2 View  Result Date: 11/12/2017 CLINICAL DATA:  51 year old male with cough and shortness of breath for 2 weeks. EXAM: CHEST  2 VIEW COMPARISON:  05/18/2017. FINDINGS: Cardiomediastinal silhouette is enlarged but stable in size and morphology. There is mild increase in pulmonary vascular congestion.  Focal opacity of the right middle lobe most consistent with pneumonia. Small amount of pleural fluid/ atelectasis at the minor fissure. The left lung is clear. No sizeable effusions. No pneumothorax. No acute osseous abnormality. IMPRESSION: Right middle lobe consolidation most consistent with pneumonia. Follow-up in 4-6 week after treatment recommended to exclude underlying mass. Electronically Signed   By: Kristopher Oppenheim M.D.   On: 11/12/2017 13:23    EKG: Independently reviewed.  Inverted T waves in the lateral leads, unchanged from previous  Assessment/Plan Present on Admission: . Pneumonia . 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 Cigarette smoker . Cocaine abuse ([place]) . Elevated troponin . 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 (HCC)   Cigarette smoker   Cocaine abuse (HCC)   Elevated troponin   Combined congestive systolic and diastolic heart failure (HCC)   Non-ischemic cardiomyopathy (HCC)   #Community-acquired pneumonia Detailed history as above Afebrile, no leukocytosis, saturating well on room air Chest x-ray showed RML consolidation Lactic acid within normal limits Blood culture x2, sputum culture, strep urine antigen, Legionella urine antigen all pending Duonebs Continue IV ceftriaxone, azithromycin  #End-stage renal disease HD on M/W/F, patient reports still making little urine Didn't complete dialysis on 11/28, due to not feeling well Continue renal meds, renal diet Consult to nephrology in the a.m. for dialysis Daily BMP  #Hypertension Stable Decreased home Coreg to 12.5 twice daily due to soft BP, continue Imdur  #Nonischemic cardiomyopathy/CAD Reports musculoskeletal chest pain during cough only Troponin noted to be 0.19, at baseline, EKG no  ischemic changes Last echo in 02/2016 showed LVEF of 37-48%, grade 2 diastolic dysfunction Last cath in 02/2016 showed nonischemic CM, moderate pulmonary hypertension Trend troponin Continue Coreg, imdur  #Polysubstance abuse UDS pending (patient still makes little urine) Advised against marijuana/cocaine use Advised smoking cessation    DVT prophylaxis: Heparin  Code Status: Full  Family Communication: None at bedside  Disposition Plan: Home once stable on 11/14/17  Consults called: None  Admission status: Inpatient    Alma Friendly MD [facility] Pager [phone]  If 7PM-7AM, please contact night-coverage [email or web address] Password [facility]  11/12/2017, 6:31 PM

## 2017-11-12 NOTE — ED Notes (Signed)
Notified pharmacy of need for Ocuflox

## 2017-11-12 NOTE — ED Provider Notes (Signed)
[facility] EMERGENCY DEPARTMENT Provider Note   CSN: [id number] Arrival date & time: 11/12/17  1245     History   Chief Complaint No chief complaint on file.   HPI [name] [name] is a 51 y.o. male.  The history is provided by the patient.  Cough  This is a new problem. Episode onset: 2 weeks. The problem occurs constantly. The problem has been gradually worsening. The cough is non-productive. There has been no fever. Associated symptoms include chest pain (only when he coughs) and shortness of breath. Pertinent negatives include no chills, no sweats, no ear pain, no headaches and no sore throat. He has tried nothing for the symptoms. He is a smoker.  Hx of CHF, ESRD on HD MWF, HTN, CAD, polysubstance abuse. SOB worse when lying back or on exertion. CP is sharp and only when he coughs, does not radiate, no pain at this time. Denies Abd pain, N/V/D.  Past Medical History:  Diagnosis Date  . CHF (congestive heart failure) ([place])   . Combined congestive systolic and diastolic heart failure ([place]) 04/18/2014   A. Echo 8/13: Severe LVH, EF 40-45%, inferoposterior akinesis, grade 2 diastolic dysfunction, moderate LAE, mild RVE, mildly reduced RVSF, mild RAE; cannot rule out R atrial mass-suggest TEE or cardiac MRI  //  B. Echo 3/17: Mild LVH, EF 25-30%, diffuse HK, grade 2 diastolic dysfunction, mild MR, severe LAE,  moderately reduced RVSF, severe RAE, mild TR, moderate PI, PASP 55 mmHg    . ESRD (end stage renal disease) on dialysis [facility])    MWF- [place] (05/18/2017)  . Hemodialysis patient ([place])    M,W,F  . Hypertension   . Medical non-compliance   . Non-ischemic cardiomyopathy ([place])    A. R/L HC 3/17: Normal coronary arteries, moderate pulmonary hypertension (PASP 65 mmHg), elevated LV filling pressures (LVEDP 45 mmHg)   . NSVT (nonsustained ventricular tachycardia) ([place]) 02/22/2016  . Polysubstance abuse ([place])   . Restless leg syndrome     Patient Active  Problem List   Diagnosis Date Noted  . Pneumonia 11/12/2017  . Acute systolic CHF (congestive heart failure) ([place]) 05/19/2017  . Prolonged QT interval 05/18/2017  . Prolonged Q-T interval on ECG 05/18/2017  . RUQ pain 05/18/2017  . Hernia, inguinal, right 08/05/2016  . Non-ischemic cardiomyopathy ([place])   . NSVT (nonsustained ventricular tachycardia) ([place]) 02/22/2016  . Right-sided chest pain   . Back pain   . Volume overload 02/20/2016  . Solitary pulmonary nodule 10/10/2015  . Upper airway cough syndrome 10/09/2015  . Cholecystitis 04/18/2014  . Combined congestive systolic and diastolic heart failure ([place]) 04/18/2014  . Abdominal pain 04/18/2014  . Hyperkalemia 03/08/2014  . Nausea & vomiting 03/08/2014  . End stage renal disease ([place]) 09/03/2012  . Epistaxis 07/15/2012  . Acute on chronic renal failure ([place]) 07/15/2012  . Anemia due to blood loss, acute 07/15/2012  . Elevated troponin 07/15/2012  . Metabolic acidosis 69/67/8938  . Hypocalcemia 07/15/2012  . Alcohol abuse 11/26/2010  . Cigarette smoker 11/26/2010  . Cocaine abuse ([place]) 11/26/2010  . ESRD (end stage renal disease) on dialysis ([place]) 11/26/2010  . 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 RENAL FAILURE 07/13/2009    Past Surgical History:  Procedure Laterality Date  . [place] TRANSPOSITION  01/19/2013   Procedure: BASCILIC VEIN TRANSPOSITION;  Surgeon: Conrad [place], MD;  Location: [place];  Service: Vascular;  Laterality: Left;  left 2nd  stage basilic vein transposition  . CARDIAC CATHETERIZATION N/A 02/25/2016   Procedure: Right/Left Heart Cath and Coronary Angiography;  Surgeon: Burnell Blanks, MD;  Location: [place] CV LAB;  Service: Cardiovascular;  Laterality: N/A;  . INGUINAL HERNIA REPAIR Right 08/05/2016   Procedure: RIGHT INGUINAL HERNIA REPAIR WITH MESH;  Surgeon: Donnie Mesa, MD;  Location: [place];  Service: General;   Laterality: Right;  . INSERTION OF DIALYSIS CATHETER  07/17/2012   Procedure: INSERTION OF DIALYSIS CATHETER;  Surgeon: Conrad [place], MD;  Location: [place];  Service: Vascular;  Laterality: Right;  right internal jugular  . INSERTION OF MESH Right 08/05/2016   Procedure: INSERTION OF MESH;  Surgeon: Donnie Mesa, MD;  Location: [place];  Service: General;  Laterality: Right;  . REVISON OF ARTERIOVENOUS FISTULA Left 01/06/2017   Procedure: REVISON OF ARTERIOVENOUS FISTULA;  Surgeon: Conrad [place], MD;  Location: [place];  Service: Vascular;  Laterality: Left;  . UMBILICAL HERNIA REPAIR N/A 08/05/2016   Procedure: [place];  Surgeon: Donnie Mesa, MD;  Location: [place];  Service: General;  Laterality: N/A;  . VENOGRAM N/A 08/09/2012   Procedure: VENOGRAM;  Surgeon: Conrad [place], MD;  Location: [facility] CATH LAB;  Service: Cardiovascular;  Laterality: N/A;       Home Medications    Prior to Admission medications   Medication Sig Start Date End Date Taking? Authorizing Provider  acetaminophen (TYLENOL) 325 MG tablet Take 650 mg by mouth every 6 (six) hours as needed for headache (pain).   Yes [provider]  calcium acetate (PHOSLO) 667 MG capsule Take 3 capsules (2,001 mg total) by mouth 3 (three) times daily with meals. Patient taking differently: Take 1,334-3,335 mg by mouth See admin instructions. Take 3-5 capsules (2001 - 3335 mg) by mouth 3 times daily with large meals, take 2 capsules (1334 mg) with snacks 04/21/14  Yes Mikhail, Maryann, DO  carvedilol (COREG) 25 MG tablet Take 1 tablet (25 mg total) by mouth 2 (two) times daily. 05/20/17  Yes Vann, Jessica U, DO  clonazePAM (KLONOPIN) 0.5 MG tablet Take 0.5 mg by mouth as needed. 11/03/17  Yes [provider]  isosorbide mononitrate (IMDUR) 60 MG 24 hr tablet Take 1 tablet (60 mg total) by mouth daily. 05/20/17  Yes Geradine Girt, DO  Multiple Vitamin (MULTIVITAMIN WITH MINERALS) TABS tablet Take 1 tablet by mouth  daily.   Yes [provider]  ofloxacin (OCUFLOX) 0.3 % ophthalmic solution Place 1 drop into the left eye 4 (four) times daily. 11/03/17  Yes [provider]    Family History Family History  Problem Relation Age of Onset  . Hypertension Mother   . Diabetes Mother   . Hypertension Father     Social History Social History   Tobacco Use  . Smoking status: Current Every Day Smoker    Packs/day: 0.10    Years: 30.00    Pack years: 3.00    Types: Cigarettes  . Smokeless tobacco: Never Used  Substance Use Topics  . Alcohol use: Yes    Alcohol/week: 3.6 oz    Types: 6 Cans of beer per week  . Drug use: No    Comment: 05/18/2017 "qd"     Allergies   Losartan   Review of Systems Review of Systems  Constitutional: Negative for chills and fever.  HENT: Negative for ear pain and sore throat.   Eyes: Negative for pain and visual disturbance.  Respiratory: Positive for  cough and shortness of breath.   Cardiovascular: Positive for chest pain (only when he coughs). Negative for palpitations.  Gastrointestinal: Negative for abdominal pain and vomiting.  Genitourinary: Negative for dysuria and hematuria.  Musculoskeletal: Negative for arthralgias and back pain.  Skin: Negative for color change and rash.  Neurological: Negative for seizures, syncope and headaches.  All other systems reviewed and are negative.    Physical Exam Updated Vital Signs BP (!) 145/106   Pulse 61   Temp 98.2 F (36.8 C) (Oral)   Resp 17   SpO2 100%   Physical Exam  Constitutional: He appears well-developed and well-nourished.  HENT:  Head: Normocephalic and atraumatic.  Eyes: Conjunctivae are normal.  Neck: Neck supple.  Cardiovascular: Normal rate and regular rhythm.  No murmur heard. Pulmonary/Chest: Effort normal. No tachypnea. No respiratory distress. He has rales in the right lower field.  Abdominal: Soft. There is no tenderness.  Musculoskeletal: He exhibits no edema.    Dialysis fistula in LUE, no overlying erythema   Neurological: He is alert.  Skin: Skin is warm and dry. Capillary refill takes less than 2 seconds.  Psychiatric: He has a normal mood and affect.  Nursing note and vitals reviewed.    ED Treatments / Results  Labs (all labs ordered are listed, but only abnormal results are displayed) Labs Reviewed  COMPREHENSIVE METABOLIC PANEL - Abnormal; Notable for the following components:      Result Value   Chloride 97 (*)    Glucose, Bld 147 (*)    BUN 43 (*)    Creatinine, Ser 9.85 (*)    Albumin 3.4 (*)    ALT 15 (*)    GFR calc non Af Amer 5 (*)    GFR calc Af Amer 6 (*)    All other components within normal limits  CBC WITH DIFFERENTIAL/PLATELET - Abnormal; Notable for the following components:   RDW 16.5 (*)    Platelets 140 (*)    All other components within normal limits  I-STAT TROPONIN, ED - Abnormal; Notable for the following components:   Troponin i, poc 0.19 (*)    All other components within normal limits  I-STAT CG4 LACTIC ACID, ED    EKG  EKG Interpretation None       Radiology Dg Chest 2 View  Result Date: 11/12/2017 CLINICAL DATA:  51 year old male with cough and shortness of breath for 2 weeks. EXAM: CHEST  2 VIEW COMPARISON:  05/18/2017. FINDINGS: Cardiomediastinal silhouette is enlarged but stable in size and morphology. There is mild increase in pulmonary vascular congestion. Focal opacity of the right middle lobe most consistent with pneumonia. Small amount of pleural fluid/ atelectasis at the minor fissure. The left lung is clear. No sizeable effusions. No pneumothorax. No acute osseous abnormality. IMPRESSION: Right middle lobe consolidation most consistent with pneumonia. Follow-up in 4-6 week after treatment recommended to exclude underlying mass. Electronically Signed   By: Kristopher Oppenheim M.D.   On: 11/12/2017 13:23    Procedures Procedures (including critical care time)  Medications Ordered in  ED Medications  cefTRIAXone (ROCEPHIN) 1 g in dextrose 5 % 50 mL IVPB (1 g Intravenous New Bag/Given 11/12/17 1804)  azithromycin (ZITHROMAX) tablet 500 mg (500 mg Oral Given 11/12/17 1804)     Initial Impression / Assessment and Plan / ED Course  I have reviewed the triage vital signs and the nursing notes.  Pertinent labs & imaging results that were available during my care of the patient were reviewed  by me and considered in my medical decision making (see chart for details).     51 year old male with the above history presenting with 2 weeks of worsening shortness of breath and nonproductive cough.  He is currently afebrile and hemodynamic is stable and well-appearing.  Did not finish a full dialysis session yesterday but has not missed any sessions.  CBC, CMP, lactic acid, troponin ordered in triage notable for a BUN of 43 and creatinine 9.8 related to his ESRD.  Troponin 0.19 however this appears to be his baseline.  EKG shows inverted T waves in the lateral leads similar to prior EKGs no acute ischemic changes.  Chest x-ray shows changes in the right middle lobe pneumonia.  No pneumothorax or widened mediastinum or pneumomediastinum.  Patient denies pain at this time.  We will give ceftriaxone and azithromycin for CAP.  QTc within normal limits.  Patient will be admitted to the hospital service.  Patient amenable with plan.  Final Clinical Impressions(s) / ED Diagnoses   Final diagnoses:  Community acquired pneumonia of right middle lobe of lung [facility])    ED Discharge Orders    None       Tommie Dejoseph Mali, MD 11/12/17 1810    Isla Pence, MD 11/12/17 1815

## 2017-11-12 NOTE — ED Notes (Signed)
1 set of blood cultures drawn with IV start and kept at bedside if needed.

## 2017-11-12 NOTE — ED Notes (Signed)
Pt called out asking for something for a cough.  This RN assessed, patient has wheezing, will provide PRN breathing treatment

## 2017-11-13 ENCOUNTER — Other Ambulatory Visit: Payer: Self-pay

## 2017-11-13 DIAGNOSIS — Z992 Dependence on renal dialysis: Secondary | ICD-10-CM | POA: Diagnosis not present

## 2017-11-13 DIAGNOSIS — N186 End stage renal disease: Secondary | ICD-10-CM | POA: Diagnosis not present

## 2017-11-13 DIAGNOSIS — I120 Hypertensive chronic kidney disease with stage 5 chronic kidney disease or end stage renal disease: Secondary | ICD-10-CM | POA: Diagnosis not present

## 2017-11-13 LAB — CBC WITH DIFFERENTIAL/PLATELET
BASOS ABS: 0 10*3/uL (ref 0.0–0.1)
Basophils Relative: 0 %
EOS PCT: 5 %
Eosinophils Absolute: 0.2 10*3/uL (ref 0.0–0.7)
HEMATOCRIT: 37.9 % — AB (ref 39.0–52.0)
Hemoglobin: 12.3 g/dL — ABNORMAL LOW (ref 13.0–17.0)
LYMPHS ABS: 1.7 10*3/uL (ref 0.7–4.0)
LYMPHS PCT: 35 %
MCH: 31 pg (ref 26.0–34.0)
MCHC: 32.5 g/dL (ref 30.0–36.0)
MCV: 95.5 fL (ref 78.0–100.0)
Monocytes Absolute: 0.4 10*3/uL (ref 0.1–1.0)
Monocytes Relative: 8 %
NEUTROS ABS: 2.6 10*3/uL (ref 1.7–7.7)
Neutrophils Relative %: 52 %
PLATELETS: 144 10*3/uL — AB (ref 150–400)
RBC: 3.97 MIL/uL — AB (ref 4.22–5.81)
RDW: 16.4 % — ABNORMAL HIGH (ref 11.5–15.5)
WBC: 4.9 10*3/uL (ref 4.0–10.5)

## 2017-11-13 LAB — RESPIRATORY PANEL BY PCR
Adenovirus: NOT DETECTED
BORDETELLA PERTUSSIS-RVPCR: NOT DETECTED
CHLAMYDOPHILA PNEUMONIAE-RVPPCR: NOT DETECTED
CORONAVIRUS 229E-RVPPCR: NOT DETECTED
CORONAVIRUS HKU1-RVPPCR: NOT DETECTED
Coronavirus NL63: NOT DETECTED
Coronavirus OC43: NOT DETECTED
INFLUENZA B-RVPPCR: NOT DETECTED
Influenza A: NOT DETECTED
METAPNEUMOVIRUS-RVPPCR: NOT DETECTED
Mycoplasma pneumoniae: NOT DETECTED
Parainfluenza Virus 1: NOT DETECTED
Parainfluenza Virus 2: NOT DETECTED
Parainfluenza Virus 3: NOT DETECTED
Parainfluenza Virus 4: NOT DETECTED
RESPIRATORY SYNCYTIAL VIRUS-RVPPCR: NOT DETECTED
Rhinovirus / Enterovirus: NOT DETECTED

## 2017-11-13 LAB — BASIC METABOLIC PANEL
ANION GAP: 16 — AB (ref 5–15)
BUN: 53 mg/dL — AB (ref 6–20)
CHLORIDE: 98 mmol/L — AB (ref 101–111)
CO2: 23 mmol/L (ref 22–32)
Calcium: 8.4 mg/dL — ABNORMAL LOW (ref 8.9–10.3)
Creatinine, Ser: 10.97 mg/dL — ABNORMAL HIGH (ref 0.61–1.24)
GFR calc Af Amer: 6 mL/min — ABNORMAL LOW (ref >60)
GFR calc non Af Amer: 5 mL/min — ABNORMAL LOW (ref >60)
GLUCOSE: 84 mg/dL (ref 65–99)
POTASSIUM: 4.1 mmol/L (ref 3.5–5.1)
Sodium: 137 mmol/L (ref 135–145)

## 2017-11-13 LAB — HIV ANTIBODY (ROUTINE TESTING W REFLEX): HIV SCREEN 4TH GENERATION: NONREACTIVE

## 2017-11-13 LAB — TROPONIN I: Troponin I: 0.22 ng/mL (ref <0.03)

## 2017-11-13 LAB — MAGNESIUM: Magnesium: 2.5 mg/dL — ABNORMAL HIGH (ref 1.7–2.4)

## 2017-11-13 MED ORDER — SODIUM CHLORIDE 0.9% FLUSH: 3.0000 mL | INTRAVENOUS | Status: DC | PRN

## 2017-11-13 MED ORDER — ACETAMINOPHEN 325 MG PO TABS
650.0000 mg | ORAL_TABLET | Freq: Four times a day (QID) | ORAL | Status: DC | PRN
  Administered 2017-11-13 (×2): 650 mg via ORAL
  Filled 2017-11-13 (×2): qty 2

## 2017-11-13 MED ORDER — LIDOCAINE HCL (PF) 1 % IJ SOLN
5.0000 mL | INTRAMUSCULAR | Status: DC | PRN
  Filled 2017-11-13: qty 5

## 2017-11-13 MED ORDER — PNEUMOCOCCAL VAC POLYVALENT 25 MCG/0.5ML IJ INJ
0.5000 mL | INJECTION | INTRAMUSCULAR | Status: AC
  Administered 2017-11-14: 0.5 mL via INTRAMUSCULAR
  Filled 2017-11-13: qty 0.5

## 2017-11-13 MED ORDER — LIDOCAINE-PRILOCAINE 2.5-2.5 % EX CREA
1.0000 "application " | TOPICAL_CREAM | CUTANEOUS | Status: DC | PRN
  Filled 2017-11-13: qty 5

## 2017-11-13 MED ORDER — SODIUM CHLORIDE 0.9 % IV SOLN: 100.0000 mL | INTRAVENOUS | Status: DC | PRN

## 2017-11-13 MED ORDER — IPRATROPIUM-ALBUTEROL 0.5-2.5 (3) MG/3ML IN SOLN: 3.0000 mL | Freq: Three times a day (TID) | RESPIRATORY_TRACT | Status: DC

## 2017-11-13 MED ORDER — MELATONIN 3 MG PO TABS
3.0000 mg | ORAL_TABLET | Freq: Every day | ORAL | Status: DC
  Administered 2017-11-13: 3 mg via ORAL
  Filled 2017-11-13: qty 1

## 2017-11-13 MED ORDER — ALTEPLASE 2 MG IJ SOLR
2.0000 mg | Freq: Once | INTRAMUSCULAR | Status: DC | PRN
  Filled 2017-11-13: qty 2

## 2017-11-13 MED ORDER — PENTAFLUOROPROP-TETRAFLUOROETH EX AERO: 1.0000 "application " | INHALATION_SPRAY | CUTANEOUS | Status: DC | PRN

## 2017-11-13 MED ORDER — CALCITRIOL 0.25 MCG PO CAPS: 0.5000 ug | ORAL_CAPSULE | ORAL | Status: DC

## 2017-11-13 MED ORDER — DM-GUAIFENESIN ER 30-600 MG PO TB12
1.0000 | ORAL_TABLET | Freq: Two times a day (BID) | ORAL | Status: DC | PRN
  Administered 2017-11-13: 1 via ORAL
  Filled 2017-11-13: qty 1

## 2017-11-13 MED ORDER — HEPARIN SODIUM (PORCINE) 1000 UNIT/ML DIALYSIS
20.0000 [IU]/kg | INTRAMUSCULAR | Status: DC | PRN
  Filled 2017-11-13: qty 2

## 2017-11-13 MED ORDER — CARVEDILOL 25 MG PO TABS
25.0000 mg | ORAL_TABLET | Freq: Two times a day (BID) | ORAL | Status: DC
  Administered 2017-11-13 – 2017-11-14 (×3): 25 mg via ORAL
  Filled 2017-11-13 (×3): qty 1

## 2017-11-13 MED ORDER — MELATONIN 3 MG PO TABS
3.0000 mg | ORAL_TABLET | Freq: Once | ORAL | Status: AC
  Administered 2017-11-13: 3 mg via ORAL
  Filled 2017-11-13: qty 1

## 2017-11-13 MED ORDER — SODIUM CHLORIDE 0.9% FLUSH
3.0000 mL | Freq: Two times a day (BID) | INTRAVENOUS | Status: DC
  Administered 2017-11-13 – 2017-11-14 (×3): 3 mL via INTRAVENOUS

## 2017-11-13 MED ORDER — HEPARIN SODIUM (PORCINE) 1000 UNIT/ML DIALYSIS
1000.0000 [IU] | INTRAMUSCULAR | Status: DC | PRN
  Filled 2017-11-13: qty 1

## 2017-11-13 NOTE — Progress Notes (Signed)
This RN and a NT on the floor went to [facility], ED overflow to transfer the pt to 5W-19. Pt was assessed to be AxOx4. No complaints of pain. Vital signs were completed. Skin assessment completed. Cardiac telemetry initiated. Droplet isolation was noted and initiated. Will continue to monitor pt.

## 2017-11-13 NOTE — Progress Notes (Signed)
PROGRESS NOTE  [name] [name] VPX:[id number] DOB: [birth year] DOA: 11/12/2017 PCP: [facility]  HPI/Recap of past 24 hours: [name] [name] is a 51 y.o. male with medical history significant for end-stage renal disease on hemodialysis M/W/F, hypertension, nonischemic CAD, polysubstance/tobacco abuse, presents to the ED complaining of nonproductive cough for 2 weeks.  Patient reported associated musculoskeletal sharp chest pain, nonradiating, only when he coughs, reports shortness of breath.  Patient denies any left-sided chest pain, fever/chills, abdominal pain, nausea/vomiting/diarrhea, sore throat, runny nose. Chest x-ray showed right middle lobe consolidation consistent with pneumonia.  Due to patient's complex medical history, patient was admitted for community-acquired pneumonia.  Today, pt still coughing, feeling slightly better overall. Denies any chest pain, worsening SOB, abdominal pain, N/V/D/C, fever/chills. Pt noted to have episodes of NSVT over night and this am.    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)   Cigarette smoker   Cocaine abuse (HCC)   Elevated troponin   Combined congestive systolic and diastolic heart failure (HCC)   Non-ischemic cardiomyopathy ([place])  #Community-acquired pneumonia Detailed history as above Afebrile, no leukocytosis, saturating well on room air Chest x-ray showed RML consolidation Lactic acid within normal limits Blood culture x2, sputum culture, strep urine antigen, Legionella urine antigen all pending Duonebs Continue IV ceftriaxone, azithromycin  #End-stage renal disease HD on M/W/F, patient reports still making little urine Didn't complete dialysis on 11/28, due to not feeling well Continue renal meds, renal diet Consult to nephrology for dialysis Daily BMP  #Hypertension Stable Continue home Coreg to 25 mg  twice daily, continue Imdur  #Nonischemic cardiomyopathy/CAD Compensated Reports musculoskeletal chest pain during cough only Troponin noted to be 0.19, at baseline with flat trend, EKG no ischemic changes Last echo in 02/2016 showed LVEF of 46-27%, grade 2 diastolic dysfunction Last cath in 02/2016 showed nonischemic CM, moderate pulmonary hypertension Trend troponin Continue Coreg, imdur  #Polysubstance abuse UDS pending (patient still makes little urine) Advised against marijuana/cocaine use Advised smoking cessation      Code Status: Full  Family Communication: None at bedside  Disposition Plan: Home once stable   Consultants:  Nephrology  Procedures:  None   Antimicrobials:  IV ceftriaxone  IV Azithromycin  DVT prophylaxis:  Heparin   Objective: Vitals:   11/13/17 0326 11/13/17 0538 11/13/17 0807 11/13/17 0912  BP:  (!) 126/105 (!) 133/101   Pulse:  65 76   Resp:  20    Temp:  98 F (36.7 C)    TempSrc:  Oral    SpO2: 100% 100%  95%  Weight:      Height:        Intake/Output Summary (Last 24 hours) at 11/13/2017 1346 Last data filed at 11/12/2017 2214 Gross per 24 hour  Intake 270 ml  Output -  Net 270 ml   Filed Weights   11/13/17 0053  Weight: 74.7 kg (164 lb 9.6 oz)    Exam:   General:  Alert, awake, oriented X 3  Cardiovascular: S1-S2, present, no added heart sound   Respiratory: RLL rhonchi, otherwise, clear to auscultation  Abdomen: Soft, non-tender, non-distended, bowel sound present   Musculoskeletal: No bilateral pedal edema, LUE graft  Skin: Normal  Psychiatry: Normal mood   Data Reviewed: CBC: Recent Labs  Lab 11/12/17 1304 11/13/17 0343  WBC 4.2 4.9  NEUTROABS 2.5 2.6  HGB 13.7 12.3*  HCT 40.8 37.9*  MCV 96.0  95.5  PLT 140* 196*   Basic Metabolic Panel: Recent Labs  Lab 11/12/17 1304 11/13/17 0343  NA 139 137  K 4.2 4.1  CL 97* 98*  CO2 31 23  GLUCOSE 147* 84  BUN 43* 53*  CREATININE  9.85* 10.97*  CALCIUM 9.1 8.4*  MG  --  2.5*   GFR: Estimated Creatinine Clearance: 8.1 mL/min (A) (by C-G formula based on SCr of 10.97 mg/dL (H)). Liver Function Tests: Recent Labs  Lab 11/12/17 1304  AST 20  ALT 15*  ALKPHOS 84  BILITOT 1.1  PROT 7.5  ALBUMIN 3.4*   No results for input(s): LIPASE, AMYLASE in the last 168 hours. No results for input(s): AMMONIA in the last 168 hours. Coagulation Profile: No results for input(s): INR, PROTIME in the last 168 hours. Cardiac Enzymes: Recent Labs  Lab 11/12/17 2130 11/13/17 0343  TROPONINI 0.19* 0.22*   BNP (last 3 results) No results for input(s): PROBNP in the last 8760 hours. HbA1C: No results for input(s): HGBA1C in the last 72 hours. CBG: No results for input(s): GLUCAP in the last 168 hours. Lipid Profile: No results for input(s): CHOL, HDL, LDLCALC, TRIG, CHOLHDL, LDLDIRECT in the last 72 hours. Thyroid Function Tests: No results for input(s): TSH, T4TOTAL, FREET4, T3FREE, THYROIDAB in the last 72 hours. Anemia Panel: No results for input(s): VITAMINB12, FOLATE, FERRITIN, TIBC, IRON, RETICCTPCT in the last 72 hours. Urine analysis:    Component Value Date/Time   COLORURINE YELLOW 04/19/2014 0109   APPEARANCEUR CLEAR 04/19/2014 0109   LABSPEC 1.012 04/19/2014 0109   PHURINE 7.0 04/19/2014 0109   GLUCOSEU NEGATIVE 04/19/2014 0109   HGBUR SMALL (A) 04/19/2014 0109   BILIRUBINUR NEGATIVE 04/19/2014 0109   KETONESUR NEGATIVE 04/19/2014 0109   PROTEINUR 100 (A) 04/19/2014 0109   UROBILINOGEN 0.2 04/19/2014 0109   NITRITE NEGATIVE 04/19/2014 0109   LEUKOCYTESUR TRACE (A) 04/19/2014 0109   Sepsis Labs: @LABRCNTIP (procalcitonin:4,lacticidven:4)  ) Recent Results (from the past 240 hour(s))  Respiratory Panel by PCR     Status: None   Collection Time: 11/13/17 12:40 AM  Result Value Ref Range Status   Adenovirus NOT DETECTED NOT DETECTED Final   Coronavirus 229E NOT DETECTED NOT DETECTED Final    Coronavirus HKU1 NOT DETECTED NOT DETECTED Final   Coronavirus NL63 NOT DETECTED NOT DETECTED Final   Coronavirus OC43 NOT DETECTED NOT DETECTED Final   Metapneumovirus NOT DETECTED NOT DETECTED Final   Rhinovirus / Enterovirus NOT DETECTED NOT DETECTED Final   Influenza A NOT DETECTED NOT DETECTED Final   Influenza B NOT DETECTED NOT DETECTED Final   Parainfluenza Virus 1 NOT DETECTED NOT DETECTED Final   Parainfluenza Virus 2 NOT DETECTED NOT DETECTED Final   Parainfluenza Virus 3 NOT DETECTED NOT DETECTED Final   Parainfluenza Virus 4 NOT DETECTED NOT DETECTED Final   Respiratory Syncytial Virus NOT DETECTED NOT DETECTED Final   Bordetella pertussis NOT DETECTED NOT DETECTED Final   Chlamydophila pneumoniae NOT DETECTED NOT DETECTED Final   Mycoplasma pneumoniae NOT DETECTED NOT DETECTED Final      Studies: No results found.  Scheduled Meds: . azithromycin  500 mg Oral Daily  . [START ON 11/16/2017] calcitRIOL  0.5 mcg Oral Q Mon-HD  . calcium acetate  2,001 mg Oral TID WC  . carvedilol  25 mg Oral BID WC  . heparin  5,000 Units Subcutaneous Q8H  . ipratropium-albuterol  3 mL Nebulization Q6H  . isosorbide mononitrate  60 mg Oral Daily  . Melatonin  3 mg Oral QHS  . multivitamin with minerals  1 tablet Oral Daily  . ofloxacin  1 drop Left Eye QID  . [START ON 11/14/2017] pneumococcal 23 valent vaccine  0.5 mL Intramuscular Tomorrow-1000  . sodium chloride flush  3 mL Intravenous Q12H  . sodium chloride flush  3 mL Intravenous Q12H    Continuous Infusions: . sodium chloride    . sodium chloride    . cefTRIAXone (ROCEPHIN)  IV       LOS: 1 day     Alma Friendly, MD [facility]  If 7PM-7AM, please contact night-coverage [email or web address] Password [email or web address] 11/13/2017, 1:46 PM

## 2017-11-13 NOTE — Progress Notes (Addendum)
At around 2am, pt had 13 beats of Vtach. Pt was asymptomatic. NP on call was notified. No new orders.  At around 3:45am, pt reported SOB. Pt was put on 3l, he reports eased breathing and SpO2 was assessed to be 100% on 3l. NP on call notified. Will continue to monitor.

## 2017-11-13 NOTE — Consult Note (Signed)
[facility] Renal Consultation Note    Indication for Consultation:  Management of ESRD/hemodialysis; anemia, hypertension/volume and secondary hyperparathyroidism PCP:  HPI: [name] [name] is a 51 y.o. male with ESRD secondary to HTN on HD at [place] (MWF) since 07/2012 with PMHx of CHF (EF 40 - 45% 2015), NSVT, substance abuse was + cocaine and cannibis June 2018  still smoking who presented with a two week hx of dry cough. He denies fever and chills, but did report SOB to his rounding nephrologist this week.  He has not been attaining his EDW recently but also signs off 15 - 40 min early at times.   He tells me that his father told him he should get his cough checked out because it has gone on so long. He has severe restless leg syndrome but overall this has improved and though he signs off early on HD, he stays on [facility] longer than he used to.  Today he feels better and is coughing less.  He doesn't feel like his appetite has lessened at all. Work up upon admission showed RML consolidation. He was afebrile without ^ WBC. Sats were ok on room air. There were no acute changes on EKG. He was started on empiric antibiotics for CAP.  Past Medical History:  Diagnosis Date  . CHF (congestive heart failure) ([place])   . Combined congestive systolic and diastolic heart failure ([place]) 04/18/2014   A. Echo 8/13: Severe LVH, EF 40-45%, inferoposterior akinesis, grade 2 diastolic dysfunction, moderate LAE, mild RVE, mildly reduced RVSF, mild RAE; cannot rule out R atrial mass-suggest TEE or cardiac MRI  //  B. Echo 3/17: Mild LVH, EF 25-30%, diffuse HK, grade 2 diastolic dysfunction, mild MR, severe LAE,  moderately reduced RVSF, severe RAE, mild TR, moderate PI, PASP 55 mmHg    . ESRD (end stage renal disease) on dialysis [facility])    MWF- [place] (05/18/2017)  . Hemodialysis patient ([place])    M,W,F  . Hypertension   . Medical non-compliance   . Non-ischemic cardiomyopathy ([place])    A. R/L HC 3/17:  Normal coronary arteries, moderate pulmonary hypertension (PASP 65 mmHg), elevated LV filling pressures (LVEDP 45 mmHg)   . NSVT (nonsustained ventricular tachycardia) ([place]) 02/22/2016  . Polysubstance abuse ([place])   . Restless leg syndrome    Past Surgical History:  Procedure Laterality Date  . [place] TRANSPOSITION  01/19/2013   Procedure: BASCILIC VEIN TRANSPOSITION;  Surgeon: Conrad Elida, MD;  Location: [place];  Service: Vascular;  Laterality: Left;  left 2nd stage basilic vein transposition  . CARDIAC CATHETERIZATION N/A 02/25/2016   Procedure: Right/Left Heart Cath and Coronary Angiography;  Surgeon: Burnell Blanks, MD;  Location: [place] CV LAB;  Service: Cardiovascular;  Laterality: N/A;  . INGUINAL HERNIA REPAIR Right 08/05/2016   Procedure: RIGHT INGUINAL HERNIA REPAIR WITH MESH;  Surgeon: Donnie Mesa, MD;  Location: [place];  Service: General;  Laterality: Right;  . INSERTION OF DIALYSIS CATHETER  07/17/2012   Procedure: INSERTION OF DIALYSIS CATHETER;  Surgeon: Conrad Edgeley, MD;  Location: [place];  Service: Vascular;  Laterality: Right;  right internal jugular  . INSERTION OF MESH Right 08/05/2016   Procedure: INSERTION OF MESH;  Surgeon: Donnie Mesa, MD;  Location: [place];  Service: General;  Laterality: Right;  . REVISON OF ARTERIOVENOUS FISTULA Left 01/06/2017   Procedure: REVISON OF ARTERIOVENOUS FISTULA;  Surgeon: Conrad [place], MD;  Location: [place];  Service: Vascular;  Laterality: Left;  .  UMBILICAL HERNIA REPAIR N/A 08/05/2016   Procedure: UMBILICAL HERINA REPAIR WITH MESH;  Surgeon: Donnie Mesa, MD;  Location: [place];  Service: General;  Laterality: N/A;  . VENOGRAM N/A 08/09/2012   Procedure: VENOGRAM;  Surgeon: Conrad Galax, MD;  Location: [facility] CATH LAB;  Service: Cardiovascular;  Laterality: N/A;   Family History  Problem Relation Age of Onset  . Hypertension Mother   . Diabetes Mother   . Hypertension Father    Social History:  reports that he has been  smoking cigarettes.  He has a 3.00 pack-year smoking history. he has never used smokeless tobacco. He reports that he drinks about 3.6 oz of alcohol per week. He reports that he does not use drugs. Allergies  Allergen Reactions  . Losartan Cough   Prior to Admission medications   Medication Sig Start Date End Date Taking? Authorizing Provider  acetaminophen (TYLENOL) 325 MG tablet Take 650 mg by mouth every 6 (six) hours as needed for headache (pain).   Yes [provider]  calcium acetate (PHOSLO) 667 MG capsule Take 3 capsules (2,001 mg total) by mouth 3 (three) times daily with meals. Patient taking differently: Take 1,334-3,335 mg by mouth See admin instructions. Take 3-5 capsules (2001 - 3335 mg) by mouth 3 times daily with large meals, take 2 capsules (1334 mg) with snacks 04/21/14  Yes Mikhail, Maryann, DO  carvedilol (COREG) 25 MG tablet Take 1 tablet (25 mg total) by mouth 2 (two) times daily. 05/20/17  Yes Vann, Jessica U, DO  clonazePAM (KLONOPIN) 0.5 MG tablet Take 0.5 mg by mouth as needed. 11/03/17  Yes [provider]  isosorbide mononitrate (IMDUR) 60 MG 24 hr tablet Take 1 tablet (60 mg total) by mouth daily. 05/20/17  Yes Geradine Girt, DO  Multiple Vitamin (MULTIVITAMIN WITH MINERALS) TABS tablet Take 1 tablet by mouth daily.   Yes [provider]  ofloxacin (OCUFLOX) 0.3 % ophthalmic solution Place 1 drop into the left eye 4 (four) times daily. 11/03/17  Yes [provider]   Current Facility-Administered Medications  Medication Dose Route Frequency Provider Last Rate Last Dose  . 0.9 %  sodium chloride infusion  250 mL Intravenous PRN Alma Friendly, MD      . 0.9 %  sodium chloride infusion  250 mL Intravenous PRN Alma Friendly, MD      . acetaminophen (TYLENOL) tablet 650 mg  650 mg Oral Q6H PRN Alma Friendly, MD   650 mg at 11/13/17 0807  . azithromycin (ZITHROMAX) tablet 500 mg  500 mg Oral Daily Alma Friendly,  MD   500 mg at 11/13/17 1109  . [START ON 11/16/2017] calcitRIOL (ROCALTROL) capsule 0.5 mcg  0.5 mcg Oral Q Mon-HD Alric Seton, PA-C      . calcium acetate (PHOSLO) capsule 1,334 mg  1,334 mg Oral BID PRN Alma Friendly, MD      . calcium acetate (PHOSLO) capsule 2,001 mg  2,001 mg Oral TID WC Alma Friendly, MD   2,001 mg at 11/13/17 1230  . carvedilol (COREG) tablet 25 mg  25 mg Oral BID WC Alma Friendly, MD   25 mg at 11/13/17 1610  . cefTRIAXone (ROCEPHIN) 1 g in dextrose 5 % 50 mL IVPB  1 g Intravenous Q24H Alma Friendly, MD      . clonazePAM Bobbye [place]) tablet 0.5 mg  0.5 mg Oral PRN Alma Friendly, MD   0.5 mg at 11/13/17 0128  .  dextromethorphan-guaiFENesin (MUCINEX DM) 30-600 MG per 12 hr tablet 1 tablet  1 tablet Oral BID PRN Alma Friendly, MD      . heparin injection 5,000 Units  5,000 Units Subcutaneous Q8H Alma Friendly, MD   5,000 Units at 11/13/17 0509  . hydrALAZINE (APRESOLINE) injection 5 mg  5 mg Intravenous Q8H PRN Alma Friendly, MD      . ipratropium-albuterol (DUONEB) 0.5-2.5 (3) MG/3ML nebulizer solution 3 mL  3 mL Nebulization Q6H Alma Friendly, MD   3 mL at 11/13/17 0911  . ipratropium-albuterol (DUONEB) 0.5-2.5 (3) MG/3ML nebulizer solution 3 mL  3 mL Nebulization Q2H PRN Alma Friendly, MD   3 mL at 11/12/17 2359  . isosorbide mononitrate (IMDUR) 24 hr tablet 60 mg  60 mg Oral Daily Alma Friendly, MD   60 mg at 11/13/17 1108  . Melatonin TABS 3 mg  3 mg Oral QHS Alma Friendly, MD      . multivitamin with minerals tablet 1 tablet  1 tablet Oral Daily Alma Friendly, MD   1 tablet at 11/13/17 1109  . ofloxacin (OCUFLOX) 0.3 % ophthalmic solution 1 drop  1 drop Left Eye QID Alma Friendly, MD   1 drop at 11/13/17 1030  . [START ON 11/14/2017] pneumococcal 23 valent vaccine (PNU-IMMUNE) injection 0.5 mL  0.5 mL Intramuscular Tomorrow-1000 Alma Friendly, MD      . sodium  chloride flush (NS) 0.9 % injection 3 mL  3 mL Intravenous Q12H Alma Friendly, MD   3 mL at 11/12/17 2309  . sodium chloride flush (NS) 0.9 % injection 3 mL  3 mL Intravenous PRN Alma Friendly, MD      . sodium chloride flush (NS) 0.9 % injection 3 mL  3 mL Intravenous Q12H Alma Friendly, MD   3 mL at 11/13/17 0129  . sodium chloride flush (NS) 0.9 % injection 3 mL  3 mL Intravenous PRN Alma Friendly, MD       Labs: Basic Metabolic Panel: Recent Labs  Lab 11/12/17 1304 11/13/17 0343  NA 139 137  K 4.2 4.1  CL 97* 98*  CO2 31 23  GLUCOSE 147* 84  BUN 43* 53*  CREATININE 9.85* 10.97*  CALCIUM 9.1 8.4*   Liver Function Tests: Recent Labs  Lab 11/12/17 1304  AST 20  ALT 15*  ALKPHOS 84  BILITOT 1.1  PROT 7.5  ALBUMIN 3.4*   CBC: Recent Labs  Lab 11/12/17 1304 11/13/17 0343  WBC 4.2 4.9  NEUTROABS 2.5 2.6  HGB 13.7 12.3*  HCT 40.8 37.9*  MCV 96.0 95.5  PLT 140* 144*   Cardiac Enzymes: Recent Labs  Lab 11/12/17 2130 11/13/17 0343  TROPONINI 0.19* 0.22*  Studies/Results: Dg Chest 2 View  Result Date: 11/12/2017 CLINICAL DATA:  51 year old male with cough and shortness of breath for 2 weeks. EXAM: CHEST  2 VIEW COMPARISON:  05/18/2017. FINDINGS: Cardiomediastinal silhouette is enlarged but stable in size and morphology. There is mild increase in pulmonary vascular congestion. Focal opacity of the right middle lobe most consistent with pneumonia. Small amount of pleural fluid/ atelectasis at the minor fissure. The left lung is clear. No sizeable effusions. No pneumothorax. No acute osseous abnormality. IMPRESSION: Right middle lobe consolidation most consistent with pneumonia. Follow-up in 4-6 week after treatment recommended to exclude underlying mass. Electronically Signed   By: Kristopher Oppenheim M.D.   On: 11/12/2017 13:23    ROS: As per  HPI otherwise negative.   Physical Exam: Vitals:   11/13/17 0326 11/13/17 0538 11/13/17 0807 11/13/17  0912  BP:  (!) 126/105 (!) 133/101   Pulse:  65 76   Resp:  20    Temp:  98 F (36.7 C)    TempSrc:  Oral    SpO2: 100% 100%  95%  Weight:      Height:         General: WDWN AAM breathing easily on room air Head: NCAT sclera muddy  Neck: Supple.  Lungs: rhonchi right mid.lower lung - no rales bialterally Breathing is unlabored. Heart: RRR with S1 S2.  Abdomen: soft NT + BS Lower extremities:without edema or ischemic changes, no open wounds  Neuro: A & O  X 3. Moves all extremities spontaneously. Psych:  Responds to questions appropriately with a normal affect. Dialysis Access:left upper AVF + bruit totuous  Dialysis Orders: [facility] MWF 4 hr 400/600 73.5kg   2 K 2 Ca profile 2 left upper AVF, heparin 5 K calcitriol 0.5 no ESA or Fe Recent Labs:  hgb 13.9 21% sat ferritin 1249 Ca corr 8.8 P 7.9 iPTH 360 Compliance: attends regularly but signs off 15 - 40 min early  Assessment/Plan: 1. RML PNA - improved on po zithromax and IV rocephin- hopefully can transition soon to all PO and d/c; Pneumovax given- need to notify HD unit at d/c 2. ESRD -  MWF - HD today K 4.1 - use 3 K bath today 3. Hypertension/volume  - Has not been getting to edw but leaves early - so hard to know if EDW truly needs adjusting - 4. Anemia  - hgb 12.3 - no ESA or Fe 5. Metabolic bone disease -  Prone to ^ P missed binders - continue usual binder and VDRA dose 6. Nutrition - renal diet + vitamin\ 7. NICM- on coreg, imdur- trop 0.19 > 0.22 8. Hx polysubstance abuse - encourage abstinence  [name], PA-C [place] [phone] 11/13/2017, 12:40 PM   Pt seen, examined and agree w A/P as above.  ESRD pt with cough, RLL infiltrate, clinically stable.  On abx.  Plan HD today on schedule.  Kelly Splinter MD [employer] pager [phone]   11/13/2017, 3:10 PM

## 2017-11-13 NOTE — Progress Notes (Signed)
Pt has a nine beats run of V-Tach at around 0730 with no symptoms associated. MD notified. Will continue to monitor and treat per MD's order.

## 2017-11-14 LAB — BASIC METABOLIC PANEL
Anion gap: 13 (ref 5–15)
BUN: 30 mg/dL — ABNORMAL HIGH (ref 6–20)
CHLORIDE: 97 mmol/L — AB (ref 101–111)
CO2: 28 mmol/L (ref 22–32)
CREATININE: 7.79 mg/dL — AB (ref 0.61–1.24)
Calcium: 8.6 mg/dL — ABNORMAL LOW (ref 8.9–10.3)
GFR, EST AFRICAN AMERICAN: 8 mL/min — AB (ref >60)
GFR, EST NON AFRICAN AMERICAN: 7 mL/min — AB (ref >60)
Glucose, Bld: 68 mg/dL (ref 65–99)
POTASSIUM: 3.7 mmol/L (ref 3.5–5.1)
SODIUM: 138 mmol/L (ref 135–145)

## 2017-11-14 LAB — CBC WITH DIFFERENTIAL/PLATELET
BASOS PCT: 0 %
Basophils Absolute: 0 10*3/uL (ref 0.0–0.1)
EOS ABS: 0.2 10*3/uL (ref 0.0–0.7)
EOS PCT: 5 %
HCT: 38.1 % — ABNORMAL LOW (ref 39.0–52.0)
HEMOGLOBIN: 12.7 g/dL — AB (ref 13.0–17.0)
Lymphocytes Relative: 29 %
Lymphs Abs: 1.4 10*3/uL (ref 0.7–4.0)
MCH: 31.8 pg (ref 26.0–34.0)
MCHC: 33.3 g/dL (ref 30.0–36.0)
MCV: 95.3 fL (ref 78.0–100.0)
Monocytes Absolute: 0.4 10*3/uL (ref 0.1–1.0)
Monocytes Relative: 8 %
NEUTROS PCT: 58 %
Neutro Abs: 2.8 10*3/uL (ref 1.7–7.7)
PLATELETS: 125 10*3/uL — AB (ref 150–400)
RBC: 4 MIL/uL — AB (ref 4.22–5.81)
RDW: 16.3 % — ABNORMAL HIGH (ref 11.5–15.5)
WBC: 4.7 10*3/uL (ref 4.0–10.5)

## 2017-11-14 MED ORDER — ALBUTEROL SULFATE HFA 108 (90 BASE) MCG/ACT IN AERS: 2.0000 | INHALATION_SPRAY | Freq: Four times a day (QID) | RESPIRATORY_TRACT | 0 refills | Status: DC | PRN

## 2017-11-14 MED ORDER — AMOXICILLIN-POT CLAVULANATE 875-125 MG PO TABS: 1.0000 | ORAL_TABLET | Freq: Two times a day (BID) | ORAL | 0 refills | Status: DC

## 2017-11-14 MED ORDER — IPRATROPIUM-ALBUTEROL 0.5-2.5 (3) MG/3ML IN SOLN
3.0000 mL | Freq: Two times a day (BID) | RESPIRATORY_TRACT | Status: DC
  Administered 2017-11-14: 3 mL via RESPIRATORY_TRACT
  Filled 2017-11-14: qty 3

## 2017-11-14 MED ORDER — AMOXICILLIN-POT CLAVULANATE 875-125 MG PO TABS: ORAL_TABLET | ORAL | 0 refills | Status: DC

## 2017-11-14 MED ORDER — DM-GUAIFENESIN ER 30-600 MG PO TB12: 1.0000 | ORAL_TABLET | Freq: Two times a day (BID) | ORAL | 0 refills | Status: DC | PRN

## 2017-11-14 NOTE — Progress Notes (Signed)
Patient ready for discharge, ride on the way.

## 2017-11-14 NOTE — Progress Notes (Signed)
[facility] Progress Note   Dialysis Orders: [facility] MWF 4 hr 400/600 73.5kg   2 K 2 Ca profile 2 left upper AVF, heparin 5 K calcitriol 0.5 no ESA or Fe Recent Labs:  hgb 13.9 21% sat ferritin 1249 Ca corr 8.8 P 7.9 iPTH 360 Compliance: attends regularly but signs off 15 - 40 min early   Assessment/Plan: 1. RML PNA - improved on po zithromax and IV rocephin- d/c on augmentin- asked primary to reduce his d/c dose to 500 mg per due to ESRD status.; Pneumovax given- need to notify HD unit at d/c 2. ESRD -  MWF -will notify home HD unit of d/c 3. Hypertension/volume  - Has not been getting to edw but leaves early - so hard to know if EDW truly needs adjusting -post HD wt 73.5 Sat - no change in EDW 4. Anemia  - hgb 12.3 - no ESA or Fe 5. Metabolic bone disease -  Prone to ^ P missed binders - continue usual binder and VDRA dose 6. Nutrition - renal diet + vitamin\ 7. NICM- on coreg, imdur- trop 0.19 > 0.22 8. Hx polysubstance abuse - encourage abstinence    [name], PA-C [place] [phone] 11/14/2017,12:11 PM  LOS: 2 days   Pt seen, examined and agree w A/P as above.  Kelly Splinter MD [facility] pager [phone]   11/14/2017, 1:38 PM    Subjective:   Happy to be going home  Objective Vitals:   11/13/17 2222 11/14/17 0541 11/14/17 0819 11/14/17 1155  BP: (!) 128/98 (!) 126/99  (!) 130/94  Pulse: 81 65  70  Resp: 20 18    Temp: 98.6 F (37 C) (!) 97.4 F (36.3 C)    TempSrc: Oral Oral    SpO2: 100% 100% 100%   Weight:      Height:       Physical Exam General: dressed for d/c Heart: RRR Lungs: rhonchi on right mid and lower low Abdomen: soft NT Extremities: no LE edema Dialysis Access: left AVF + bruit   Additional Objective Labs: Basic Metabolic Panel: Recent Labs  Lab 11/12/17 1304 11/13/17 0343 11/14/17 0315  NA 139 137 138  K 4.2 4.1 3.7  CL 97* 98* 97*  CO2 31 23 28   GLUCOSE 147* 84 68   BUN 43* 53* 30*  CREATININE 9.85* 10.97* 7.79*  CALCIUM 9.1 8.4* 8.6*   Liver Function Tests: Recent Labs  Lab 11/12/17 1304  AST 20  ALT 15*  ALKPHOS 84  BILITOT 1.1  PROT 7.5  ALBUMIN 3.4*   No results for input(s): LIPASE, AMYLASE in the last 168 hours. CBC: Recent Labs  Lab 11/12/17 1304 11/13/17 0343 11/14/17 0315  WBC 4.2 4.9 4.7  NEUTROABS 2.5 2.6 2.8  HGB 13.7 12.3* 12.7*  HCT 40.8 37.9* 38.1*  MCV 96.0 95.5 95.3  PLT 140* 144* 125*   Blood Culture    Component Value Date/Time   SDES BLOOD BLOOD RIGHT HAND 11/12/2017 2130   SPECREQUEST  11/12/2017 2130    BOTTLES DRAWN AEROBIC AND ANAEROBIC Blood Culture adequate volume   CULT NO GROWTH 2 DAYS 11/12/2017 2130   REPTSTATUS PENDING 11/12/2017 2130    Cardiac Enzymes: Recent Labs  Lab 11/12/17 2130 11/13/17 0343  TROPONINI 0.19* 0.22*   CBG: No results for input(s): GLUCAP in the last 168 hours. Iron Studies: No results for input(s): IRON, TIBC, TRANSFERRIN, FERRITIN in the last 72 hours. Lab Results  Component  Value Date   INR 1.40 05/19/2017   INR 1.24 02/25/2016   INR 1.72 (H) 02/20/2016   Studies/Results: Dg Chest 2 View  Result Date: 11/12/2017 CLINICAL DATA:  51 year old male with cough and shortness of breath for 2 weeks. EXAM: CHEST  2 VIEW COMPARISON:  05/18/2017. FINDINGS: Cardiomediastinal silhouette is enlarged but stable in size and morphology. There is mild increase in pulmonary vascular congestion. Focal opacity of the right middle lobe most consistent with pneumonia. Small amount of pleural fluid/ atelectasis at the minor fissure. The left lung is clear. No sizeable effusions. No pneumothorax. No acute osseous abnormality. IMPRESSION: Right middle lobe consolidation most consistent with pneumonia. Follow-up in 4-6 week after treatment recommended to exclude underlying mass. Electronically Signed   By: Kristopher Oppenheim M.D.   On: 11/12/2017 13:23   Medications: . sodium chloride    .  sodium chloride    . sodium chloride    . sodium chloride    . cefTRIAXone (ROCEPHIN)  IV Stopped (11/13/17 2017)   . azithromycin  500 mg Oral Daily  . [START ON 11/16/2017] calcitRIOL  0.5 mcg Oral Q Mon-HD  . calcium acetate  2,001 mg Oral TID WC  . carvedilol  25 mg Oral BID WC  . heparin  5,000 Units Subcutaneous Q8H  . ipratropium-albuterol  3 mL Nebulization BID  . isosorbide mononitrate  60 mg Oral Daily  . Melatonin  3 mg Oral QHS  . multivitamin with minerals  1 tablet Oral Daily  . ofloxacin  1 drop Left Eye QID  . sodium chloride flush  3 mL Intravenous Q12H  . sodium chloride flush  3 mL Intravenous Q12H

## 2017-11-14 NOTE — Plan of Care (Signed)
  Progressing Clinical Measurements: Ability to maintain clinical measurements within normal limits will improve 11/14/2017 0623 - Progressing by Verne Grain, RN Note Telemetry called and stated patient had several runs of VT overnight patient denies any chest pain, weakness, lightheadedness. Vitals remain stable.

## 2017-11-14 NOTE — Progress Notes (Signed)
[name] to be D/C'd Home per MD order.  Discussed with the patient and all questions fully answered.  VSS, Skin clean, dry and intact without evidence of skin break down, no evidence of skin tears noted. IV catheter discontinued intact. Site without signs and symptoms of complications. Dressing and pressure applied.  An After Visit Summary was printed and given to the patient. Patient received prescription.  D/c education completed with patient/family including follow up instructions, medication list, d/c activities limitations if indicated, with other d/c instructions as indicated by MD - patient able to verbalize understanding, all questions fully answered.   Patient instructed to return to ED, call 911, or call MD for any changes in condition.   Patient escorted via [place], and D/C home via private auto. Discharge Instructions    Diet - low sodium heart healthy   Complete by:  As directed    Increase activity slowly   Complete by:  As directed      Allergies as of 11/14/2017      Reactions   Losartan Cough      Medication List    TAKE these medications   acetaminophen 325 MG tablet Commonly known as:  TYLENOL Take 650 mg by mouth every 6 (six) hours as needed for headache (pain).   albuterol 108 (90 Base) MCG/ACT inhaler Commonly known as:  PROVENTIL HFA;VENTOLIN HFA Inhale 2 puffs into the lungs every 6 (six) hours as needed for wheezing or shortness of breath.   amoxicillin-clavulanate 875-125 MG tablet Commonly known as:  AUGMENTIN TAKE THE 500MG  TABS CALLED INTO YOUR PHARMACY   calcium acetate 667 MG capsule Commonly known as:  PHOSLO Take 3 capsules (2,001 mg total) by mouth 3 (three) times daily with meals. What changed:    how much to take  when to take this  additional instructions   carvedilol 25 MG tablet Commonly known as:  COREG Take 1 tablet (25 mg total) by mouth 2 (two) times daily.   clonazePAM 0.5 MG tablet Commonly known as:   KLONOPIN Take 0.5 mg by mouth as needed.   dextromethorphan-guaiFENesin 30-600 MG 12hr tablet Commonly known as:  MUCINEX DM Take 1 tablet by mouth 2 (two) times daily as needed for cough.   isosorbide mononitrate 60 MG 24 hr tablet Commonly known as:  IMDUR Take 1 tablet (60 mg total) by mouth daily.   multivitamin with minerals Tabs tablet Take 1 tablet by mouth daily.   ofloxacin 0.3 % ophthalmic solution Commonly known as:  OCUFLOX Place 1 drop into the left eye 4 (four) times daily.      Betha Loa Apolonia Ellwood 11/14/2017 1:58 PM

## 2017-11-14 NOTE — Progress Notes (Signed)
Spoke to patient at the bedside. CM consult for PCP needs. Patient states he goes to [facility] but is not satisfied with the care. Patient is assigned to this clinic through [facility]. CM instructed patient to contact Medicaid to have PCP changed, they will review with him his options for new PCP. Patient verbalized understanding, no other CM needs identified at this time.

## 2017-11-14 NOTE — Discharge Summary (Signed)
Discharge Summary  [name] [name] OIZ:[id number] DOB: [birth year]  [facility]P: [name]  Admit date: 11/12/2017 Discharge date: 11/14/2017  Time spent: >21mins  Recommendations for Outpatient Follow-up:  1. PCP 2. Dialysis  Discharge Diagnoses:  Active Hospital Problems   Diagnosis Date Noted  . Pneumonia 11/12/2017  . Non-ischemic cardiomyopathy ([place])   . 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 Elevated troponin 07/15/2012  . Cigarette smoker 11/26/2010  . Cocaine abuse ([place]) 11/26/2010  . 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    Discharge Condition: Stable  Diet recommendation: Renal diet  Vitals:   11/14/17 0819 11/14/17 1155  BP:  (!) 130/94  Pulse:  70  Resp:    Temp:    SpO2: 100%     History of present illness:  [name] Hardinis a 51 y.o.malewith medical history significant forend-stage renal disease on hemodialysis M/W/F, hypertension, nonischemic CAD, polysubstance/tobacco abuse,presents to the ED complaining of nonproductive cough for 2 weeks. Patient reported associated musculoskeletal sharp chest pain,nonradiating,only when he coughs, reports shortness of breath. Patient denies any left-sided chest pain, fever/chills, abdominal pain, nausea/vomiting/diarrhea, sore throat, runny nose. Chest x-ray showed right middle lobe consolidation consistent with pneumonia. Due to patient's complex medical history, patient was admitted for community-acquired pneumonia.  Today, pt still coughing, re-assured pt that it will gradually resolve, otherwise feeling slightly better overall. Denies any chest pain, worsening SOB, abdominal pain, N/V/D/C, fever/chills. Pt stable for discharge.  Hospital Course:  Principal Problem:   Pneumonia Active Problems:   Hypertensive heart and kidney disease with  heart failure and end-stage renal failure ([facility])   Cigarette smoker   Cocaine abuse ([facility])   Elevated troponin   Combined congestive systolic and diastolic heart failure ([facility])   Non-ischemic cardiomyopathy ([facility])  #Community-acquired pneumonia Detailed history as above Afebrile, no leukocytosis,saturating well on room air Chest x-ray showedRMLconsolidation Lactic acid within normal limits Blood culture x2 showed NGTD Strep urine antigen, Legionella urine antigen not done as pt is oliguric S/P IV ceftriaxone, azithromycin DC pt on PO Augmentin 500mg , renally dosed, albuterol inhaler and mucinex Follow up with PCP  #End-stage renal disease HD on M/W/F Continue renal meds,renal diet  #Hypertension Stable Continue home Coreg to 25 mg twice daily, continueImdur  #Nonischemic cardiomyopathy/CAD Compensated Reports musculoskeletal chest pain during cough only Troponin noted to be 0.19, at baseline with flat trend, EKG no ischemic changes Last echo in 02/2016 showed LVEF of 33-82%, grade 2 diastolic dysfunction Last cath in 3/2017showed nonischemicCM,moderate pulmonary hypertension Continue Coreg,imdur  #Polysubstance abuse Advised against marijuana/cocaine use Advised smoking cessation   Procedures:  None  Consultations:  Nephrology   Discharge Exam: BP (!) 130/94   Pulse 70   Temp (!) 97.4 F (36.3 C) (Oral)   Resp 18   Ht 5\' 9"  (1.753 m)   Wt 73.5 kg (162 lb 0.6 oz)   SpO2 100%   BMI 23.93 kg/m   General: Alert, awake, oriented x3 Cardiovascular: S1-S2 present Respiratory: Chest clear bilaterally, with very mild expiratory wheeze  Discharge Instructions You were cared for by a hospitalist during your hospital stay. If you have any questions about your discharge medications or the care you received while you were in the hospital after you are discharged, you can call the unit and asked to speak with the hospitalist on call if the hospitalist that  took care of you  is not available. Once you are discharged, your primary care physician will handle any further medical issues. Please note that NO REFILLS for any discharge medications will be authorized once you are discharged, as it is imperative that you return to your primary care physician (or establish a relationship with a primary care physician if you do not have one) for your aftercare needs so that they can reassess your need for medications and monitor your lab values.  Discharge Instructions    Diet - low sodium heart healthy   Complete by:  As directed    Increase activity slowly   Complete by:  As directed      Allergies as of 11/14/2017      Reactions   Losartan Cough      Medication List    TAKE these medications   acetaminophen 325 MG tablet Commonly known as:  TYLENOL Take 650 mg by mouth every 6 (six) hours as needed for headache (pain).   albuterol 108 (90 Base) MCG/ACT inhaler Commonly known as:  PROVENTIL HFA;VENTOLIN HFA Inhale 2 puffs into the lungs every 6 (six) hours as needed for wheezing or shortness of breath.   amoxicillin-clavulanate 875-125 MG tablet Commonly known as:  AUGMENTIN TAKE THE 500MG  TABS CALLED INTO YOUR PHARMACY   calcium acetate 667 MG capsule Commonly known as:  PHOSLO Take 3 capsules (2,001 mg total) by mouth 3 (three) times daily with meals. What changed:    how much to take  when to take this  additional instructions   carvedilol 25 MG tablet Commonly known as:  COREG Take 1 tablet (25 mg total) by mouth 2 (two) times daily.   clonazePAM 0.5 MG tablet Commonly known as:  KLONOPIN Take 0.5 mg by mouth as needed.   dextromethorphan-guaiFENesin 30-600 MG 12hr tablet Commonly known as:  MUCINEX DM Take 1 tablet by mouth 2 (two) times daily as needed for cough.   isosorbide mononitrate 60 MG 24 hr tablet Commonly known as:  IMDUR Take 1 tablet (60 mg total) by mouth daily.   multivitamin with minerals Tabs  tablet Take 1 tablet by mouth daily.   ofloxacin 0.3 % ophthalmic solution Commonly known as:  OCUFLOX Place 1 drop into the left eye 4 (four) times daily.      Allergies  Allergen Reactions  . Losartan Cough   Follow-up Information    [place]. Go on 11/26/2017.   Why:  hospital follow up/ establish new PCP scheduled for 11/26/2017 at 9:00 am  with Domenica Fail NP Contact information: [place] [id number] [phone]           The results of significant diagnostics from this hospitalization (including imaging, microbiology, ancillary and laboratory) are listed below for reference.    Significant Diagnostic Studies: Dg Chest 2 View  Result Date: 11/12/2017 CLINICAL DATA:  51 year old male with cough and shortness of breath for 2 weeks. EXAM: CHEST  2 VIEW COMPARISON:  05/18/2017. FINDINGS: Cardiomediastinal silhouette is enlarged but stable in size and morphology. There is mild increase in pulmonary vascular congestion. Focal opacity of the right middle lobe most consistent with pneumonia. Small amount of pleural fluid/ atelectasis at the minor fissure. The left lung is clear. No sizeable effusions. No pneumothorax. No acute osseous abnormality. IMPRESSION: Right middle lobe consolidation most consistent with pneumonia. Follow-up in 4-6 week after treatment recommended to exclude underlying mass. Electronically Signed   By: Shayne Alken.D.  On: 11/12/2017 13:23    Microbiology: Recent Results (from the past 240 hour(s))  Culture, blood (routine x 2)     Status: None (Preliminary result)   Collection Time: 11/12/17  6:00 PM  Result Value Ref Range Status   Specimen Description BLOOD BLOOD RIGHT FOREARM  Final   Special Requests   Final    BOTTLES DRAWN AEROBIC AND ANAEROBIC Blood Culture adequate volume   Culture NO GROWTH 2 DAYS  Final   Report Status PENDING  Incomplete  Culture, blood  (routine x 2)     Status: None (Preliminary result)   Collection Time: 11/12/17  9:30 PM  Result Value Ref Range Status   Specimen Description BLOOD BLOOD RIGHT HAND  Final   Special Requests   Final    BOTTLES DRAWN AEROBIC AND ANAEROBIC Blood Culture adequate volume   Culture NO GROWTH 2 DAYS  Final   Report Status PENDING  Incomplete  Respiratory Panel by PCR     Status: None   Collection Time: 11/13/17 12:40 AM  Result Value Ref Range Status   Adenovirus NOT DETECTED NOT DETECTED Final   Coronavirus 229E NOT DETECTED NOT DETECTED Final   Coronavirus HKU1 NOT DETECTED NOT DETECTED Final   Coronavirus NL63 NOT DETECTED NOT DETECTED Final   Coronavirus OC43 NOT DETECTED NOT DETECTED Final   Metapneumovirus NOT DETECTED NOT DETECTED Final   Rhinovirus / Enterovirus NOT DETECTED NOT DETECTED Final   Influenza A NOT DETECTED NOT DETECTED Final   Influenza B NOT DETECTED NOT DETECTED Final   Parainfluenza Virus 1 NOT DETECTED NOT DETECTED Final   Parainfluenza Virus 2 NOT DETECTED NOT DETECTED Final   Parainfluenza Virus 3 NOT DETECTED NOT DETECTED Final   Parainfluenza Virus 4 NOT DETECTED NOT DETECTED Final   Respiratory Syncytial Virus NOT DETECTED NOT DETECTED Final   Bordetella pertussis NOT DETECTED NOT DETECTED Final   Chlamydophila pneumoniae NOT DETECTED NOT DETECTED Final   Mycoplasma pneumoniae NOT DETECTED NOT DETECTED Final     Labs: Basic Metabolic Panel: Recent Labs  Lab 11/12/17 1304 11/13/17 0343 11/14/17 0315  NA 139 137 138  K 4.2 4.1 3.7  CL 97* 98* 97*  CO2 31 23 28   GLUCOSE 147* 84 68  BUN 43* 53* 30*  CREATININE 9.85* 10.97* 7.79*  CALCIUM 9.1 8.4* 8.6*  MG  --  2.5*  --    Liver Function Tests: Recent Labs  Lab 11/12/17 1304  AST 20  ALT 15*  ALKPHOS 84  BILITOT 1.1  PROT 7.5  ALBUMIN 3.4*   No results for input(s): LIPASE, AMYLASE in the last 168 hours. No results for input(s): AMMONIA in the last 168 hours. CBC: Recent Labs  Lab  11/12/17 1304 11/13/17 0343 11/14/17 0315  WBC 4.2 4.9 4.7  NEUTROABS 2.5 2.6 2.8  HGB 13.7 12.3* 12.7*  HCT 40.8 37.9* 38.1*  MCV 96.0 95.5 95.3  PLT 140* 144* 125*   Cardiac Enzymes: Recent Labs  Lab 11/12/17 2130 11/13/17 0343  TROPONINI 0.19* 0.22*   BNP: BNP (last 3 results) No results for input(s): BNP in the last 8760 hours.  ProBNP (last 3 results) No results for input(s): PROBNP in the last 8760 hours.  CBG: No results for input(s): GLUCAP in the last 168 hours.     Signed:  Alma Friendly, MD [facility] 11/14/2017, 7:50 PM

## 2017-11-16 DIAGNOSIS — N186 End stage renal disease: Secondary | ICD-10-CM | POA: Diagnosis not present

## 2017-11-16 DIAGNOSIS — D509 Iron deficiency anemia, unspecified: Secondary | ICD-10-CM | POA: Diagnosis not present

## 2017-11-16 DIAGNOSIS — N2581 Secondary hyperparathyroidism of renal origin: Secondary | ICD-10-CM | POA: Diagnosis not present

## 2017-11-17 LAB — CULTURE, BLOOD (ROUTINE X 2)
Culture: NO GROWTH
Culture: NO GROWTH
SPECIAL REQUESTS: ADEQUATE
Special Requests: ADEQUATE

## 2017-11-18 DIAGNOSIS — N2581 Secondary hyperparathyroidism of renal origin: Secondary | ICD-10-CM | POA: Diagnosis not present

## 2017-11-18 DIAGNOSIS — N186 End stage renal disease: Secondary | ICD-10-CM | POA: Diagnosis not present

## 2017-11-18 DIAGNOSIS — D509 Iron deficiency anemia, unspecified: Secondary | ICD-10-CM | POA: Diagnosis not present

## 2017-11-20 DIAGNOSIS — N186 End stage renal disease: Secondary | ICD-10-CM | POA: Diagnosis not present

## 2017-11-20 DIAGNOSIS — D509 Iron deficiency anemia, unspecified: Secondary | ICD-10-CM | POA: Diagnosis not present

## 2017-11-20 DIAGNOSIS — N2581 Secondary hyperparathyroidism of renal origin: Secondary | ICD-10-CM | POA: Diagnosis not present

## 2017-11-23 DIAGNOSIS — N186 End stage renal disease: Secondary | ICD-10-CM | POA: Diagnosis not present

## 2017-11-23 DIAGNOSIS — D509 Iron deficiency anemia, unspecified: Secondary | ICD-10-CM | POA: Diagnosis not present

## 2017-11-23 DIAGNOSIS — N2581 Secondary hyperparathyroidism of renal origin: Secondary | ICD-10-CM | POA: Diagnosis not present

## 2017-11-25 DIAGNOSIS — N186 End stage renal disease: Secondary | ICD-10-CM | POA: Diagnosis not present

## 2017-11-25 DIAGNOSIS — N2581 Secondary hyperparathyroidism of renal origin: Secondary | ICD-10-CM | POA: Diagnosis not present

## 2017-11-25 DIAGNOSIS — D509 Iron deficiency anemia, unspecified: Secondary | ICD-10-CM | POA: Diagnosis not present

## 2017-11-26 ENCOUNTER — Ambulatory Visit (INDEPENDENT_AMBULATORY_CARE_PROVIDER_SITE_OTHER): Payer: Medicare Other | Admitting: Physician Assistant

## 2017-11-27 DIAGNOSIS — N2581 Secondary hyperparathyroidism of renal origin: Secondary | ICD-10-CM | POA: Diagnosis not present

## 2017-11-27 DIAGNOSIS — N186 End stage renal disease: Secondary | ICD-10-CM | POA: Diagnosis not present

## 2017-11-27 DIAGNOSIS — D509 Iron deficiency anemia, unspecified: Secondary | ICD-10-CM | POA: Diagnosis not present

## 2017-11-30 DIAGNOSIS — N2581 Secondary hyperparathyroidism of renal origin: Secondary | ICD-10-CM | POA: Diagnosis not present

## 2017-11-30 DIAGNOSIS — N186 End stage renal disease: Secondary | ICD-10-CM | POA: Diagnosis not present

## 2017-11-30 DIAGNOSIS — D509 Iron deficiency anemia, unspecified: Secondary | ICD-10-CM | POA: Diagnosis not present

## 2017-12-02 DIAGNOSIS — D509 Iron deficiency anemia, unspecified: Secondary | ICD-10-CM | POA: Diagnosis not present

## 2017-12-02 DIAGNOSIS — N2581 Secondary hyperparathyroidism of renal origin: Secondary | ICD-10-CM | POA: Diagnosis not present

## 2017-12-02 DIAGNOSIS — N186 End stage renal disease: Secondary | ICD-10-CM | POA: Diagnosis not present

## 2017-12-04 DIAGNOSIS — D509 Iron deficiency anemia, unspecified: Secondary | ICD-10-CM | POA: Diagnosis not present

## 2017-12-04 DIAGNOSIS — N186 End stage renal disease: Secondary | ICD-10-CM | POA: Diagnosis not present

## 2017-12-04 DIAGNOSIS — N2581 Secondary hyperparathyroidism of renal origin: Secondary | ICD-10-CM | POA: Diagnosis not present

## 2017-12-06 DIAGNOSIS — D509 Iron deficiency anemia, unspecified: Secondary | ICD-10-CM | POA: Diagnosis not present

## 2017-12-06 DIAGNOSIS — N186 End stage renal disease: Secondary | ICD-10-CM | POA: Diagnosis not present

## 2017-12-06 DIAGNOSIS — N2581 Secondary hyperparathyroidism of renal origin: Secondary | ICD-10-CM | POA: Diagnosis not present

## 2017-12-09 DIAGNOSIS — N2581 Secondary hyperparathyroidism of renal origin: Secondary | ICD-10-CM | POA: Diagnosis not present

## 2017-12-09 DIAGNOSIS — D509 Iron deficiency anemia, unspecified: Secondary | ICD-10-CM | POA: Diagnosis not present

## 2017-12-09 DIAGNOSIS — N186 End stage renal disease: Secondary | ICD-10-CM | POA: Diagnosis not present

## 2017-12-11 DIAGNOSIS — D509 Iron deficiency anemia, unspecified: Secondary | ICD-10-CM | POA: Diagnosis not present

## 2017-12-11 DIAGNOSIS — N186 End stage renal disease: Secondary | ICD-10-CM | POA: Diagnosis not present

## 2017-12-11 DIAGNOSIS — N2581 Secondary hyperparathyroidism of renal origin: Secondary | ICD-10-CM | POA: Diagnosis not present

## 2017-12-13 DIAGNOSIS — N2581 Secondary hyperparathyroidism of renal origin: Secondary | ICD-10-CM | POA: Diagnosis not present

## 2017-12-13 DIAGNOSIS — N186 End stage renal disease: Secondary | ICD-10-CM | POA: Diagnosis not present

## 2017-12-13 DIAGNOSIS — D509 Iron deficiency anemia, unspecified: Secondary | ICD-10-CM | POA: Diagnosis not present

## 2017-12-14 DIAGNOSIS — I120 Hypertensive chronic kidney disease with stage 5 chronic kidney disease or end stage renal disease: Secondary | ICD-10-CM | POA: Diagnosis not present

## 2017-12-14 DIAGNOSIS — N186 End stage renal disease: Secondary | ICD-10-CM | POA: Diagnosis not present

## 2017-12-14 DIAGNOSIS — Z992 Dependence on renal dialysis: Secondary | ICD-10-CM | POA: Diagnosis not present

## 2017-12-16 DIAGNOSIS — N186 End stage renal disease: Secondary | ICD-10-CM | POA: Diagnosis not present

## 2017-12-16 DIAGNOSIS — N2581 Secondary hyperparathyroidism of renal origin: Secondary | ICD-10-CM | POA: Diagnosis not present

## 2017-12-16 DIAGNOSIS — D509 Iron deficiency anemia, unspecified: Secondary | ICD-10-CM | POA: Diagnosis not present

## 2017-12-18 DIAGNOSIS — D509 Iron deficiency anemia, unspecified: Secondary | ICD-10-CM | POA: Diagnosis not present

## 2017-12-18 DIAGNOSIS — N2581 Secondary hyperparathyroidism of renal origin: Secondary | ICD-10-CM | POA: Diagnosis not present

## 2017-12-18 DIAGNOSIS — N186 End stage renal disease: Secondary | ICD-10-CM | POA: Diagnosis not present

## 2017-12-21 DIAGNOSIS — N2581 Secondary hyperparathyroidism of renal origin: Secondary | ICD-10-CM | POA: Diagnosis not present

## 2017-12-21 DIAGNOSIS — D509 Iron deficiency anemia, unspecified: Secondary | ICD-10-CM | POA: Diagnosis not present

## 2017-12-21 DIAGNOSIS — N186 End stage renal disease: Secondary | ICD-10-CM | POA: Diagnosis not present

## 2017-12-23 DIAGNOSIS — N2581 Secondary hyperparathyroidism of renal origin: Secondary | ICD-10-CM | POA: Diagnosis not present

## 2017-12-23 DIAGNOSIS — N186 End stage renal disease: Secondary | ICD-10-CM | POA: Diagnosis not present

## 2017-12-23 DIAGNOSIS — D509 Iron deficiency anemia, unspecified: Secondary | ICD-10-CM | POA: Diagnosis not present

## 2017-12-25 DIAGNOSIS — D509 Iron deficiency anemia, unspecified: Secondary | ICD-10-CM | POA: Diagnosis not present

## 2017-12-25 DIAGNOSIS — N186 End stage renal disease: Secondary | ICD-10-CM | POA: Diagnosis not present

## 2017-12-25 DIAGNOSIS — N2581 Secondary hyperparathyroidism of renal origin: Secondary | ICD-10-CM | POA: Diagnosis not present

## 2017-12-28 DIAGNOSIS — D509 Iron deficiency anemia, unspecified: Secondary | ICD-10-CM | POA: Diagnosis not present

## 2017-12-28 DIAGNOSIS — N2581 Secondary hyperparathyroidism of renal origin: Secondary | ICD-10-CM | POA: Diagnosis not present

## 2017-12-28 DIAGNOSIS — N186 End stage renal disease: Secondary | ICD-10-CM | POA: Diagnosis not present

## 2017-12-30 DIAGNOSIS — N186 End stage renal disease: Secondary | ICD-10-CM | POA: Diagnosis not present

## 2017-12-30 DIAGNOSIS — N2581 Secondary hyperparathyroidism of renal origin: Secondary | ICD-10-CM | POA: Diagnosis not present

## 2017-12-30 DIAGNOSIS — D509 Iron deficiency anemia, unspecified: Secondary | ICD-10-CM | POA: Diagnosis not present

## 2017-12-31 ENCOUNTER — Encounter (INDEPENDENT_AMBULATORY_CARE_PROVIDER_SITE_OTHER): Payer: Self-pay | Admitting: Physician Assistant

## 2017-12-31 ENCOUNTER — Other Ambulatory Visit: Payer: Self-pay

## 2017-12-31 ENCOUNTER — Ambulatory Visit (INDEPENDENT_AMBULATORY_CARE_PROVIDER_SITE_OTHER): Payer: Medicare Other | Admitting: Physician Assistant

## 2017-12-31 ENCOUNTER — Ambulatory Visit (HOSPITAL_COMMUNITY)
Admission: RE | Admit: 2017-12-31 | Discharge: 2017-12-31 | Disposition: A | Discharge status: Home / Self Care | Payer: Medicare Other | Source: Ambulatory Visit | Attending: Physician Assistant | Admitting: Physician Assistant

## 2017-12-31 VITALS — BP 122/85 | HR 63 | Temp 98.0°F | Ht 69.5 in | Wt 172.6 lb

## 2017-12-31 DIAGNOSIS — F17200 Nicotine dependence, unspecified, uncomplicated: Secondary | ICD-10-CM | POA: Diagnosis not present

## 2017-12-31 DIAGNOSIS — R9431 Abnormal electrocardiogram [ECG] [EKG]: Secondary | ICD-10-CM | POA: Diagnosis not present

## 2017-12-31 DIAGNOSIS — R918 Other nonspecific abnormal finding of lung field: Secondary | ICD-10-CM | POA: Insufficient documentation

## 2017-12-31 DIAGNOSIS — R05 Cough: Secondary | ICD-10-CM | POA: Diagnosis not present

## 2017-12-31 DIAGNOSIS — R053 Chronic cough: Secondary | ICD-10-CM

## 2017-12-31 MED ORDER — LEVOFLOXACIN 750 MG PO TABS: 750.0000 mg | ORAL_TABLET | Freq: Every day | ORAL | 0 refills | Status: AC

## 2017-12-31 NOTE — Progress Notes (Signed)
Subjective:  Patient ID: [name] [name], male    DOB: 1966/04/20  Age: 52 y.o. MRN: [id number]  CC: cough, hospital f/u  HPI [name] [name] is a 52 y.o. male with a medical history of CHF, ESRD, HTN, Non-ischemic cardiomyopathy, polysubstance, and restless leg syndrome that presents for hospital f/u for non productive cough. Was diagnosed with CAP. CXR found consolidation in the right middle lobe. Radiologist recommended repeat to assess for underlying mass. Had been treated with three antibiotics. Of note, WBCs were normal, Troponin was positive and had an abnormal EKG. Patient is hemodialysis patient and goes to have hemodialysis three times per week. Patient says his cough is the same as when he was admitted. Does not endorse CP, palpitations, LE edema, SOB, HA, abdominal pain, f/c/n/v, rash, or GI/GU sxs.      Outpatient Medications Prior to Visit  Medication Sig Dispense Refill  . albuterol (PROVENTIL HFA;VENTOLIN HFA) 108 (90 Base) MCG/ACT inhaler Inhale 2 puffs into the lungs every 6 (six) hours as needed for wheezing or shortness of breath. 1 Inhaler 0  . calcium acetate (PHOSLO) 667 MG capsule Take 3 capsules (2,001 mg total) by mouth 3 (three) times daily with meals. (Patient taking differently: Take 1,334-3,335 mg by mouth See admin instructions. Take 3-5 capsules (2001 - 3335 mg) by mouth 3 times daily with large meals, take 2 capsules (1334 mg) with snacks) 90 capsule 0  . carvedilol (COREG) 25 MG tablet Take 1 tablet (25 mg total) by mouth 2 (two) times daily. 60 tablet 0  . clonazePAM (KLONOPIN) 0.5 MG tablet Take 0.5 mg by mouth as needed.    . isosorbide mononitrate (IMDUR) 60 MG 24 hr tablet Take 1 tablet (60 mg total) by mouth daily. 30 tablet 0  . acetaminophen (TYLENOL) 325 MG tablet Take 650 mg by mouth every 6 (six) hours as needed for headache (pain).    Marland Kitchen amoxicillin-clavulanate (AUGMENTIN) 875-125 MG tablet TAKE THE 500MG  TABS CALLED INTO YOUR PHARMACY 10 tablet 0  .  dextromethorphan-guaiFENesin (MUCINEX DM) 30-600 MG 12hr tablet Take 1 tablet by mouth 2 (two) times daily as needed for cough. (Patient not taking: Reported on 12/31/2017) 30 tablet 0  . Multiple Vitamin (MULTIVITAMIN WITH MINERALS) TABS tablet Take 1 tablet by mouth daily.    Marland Kitchen ofloxacin (OCUFLOX) 0.3 % ophthalmic solution Place 1 drop into the left eye 4 (four) times daily.     No facility-administered medications prior to visit.      ROS Review of Systems  Constitutional: Negative for chills, fever and malaise/fatigue.  Eyes: Negative for blurred vision.  Respiratory: Positive for cough. Negative for shortness of breath.   Cardiovascular: Negative for chest pain and palpitations.  Gastrointestinal: Negative for abdominal pain and nausea.  Genitourinary: Negative for dysuria and hematuria.  Musculoskeletal: Negative for joint pain and myalgias.  Skin: Negative for rash.  Neurological: Negative for tingling and headaches.  Psychiatric/Behavioral: Negative for depression. The patient is not nervous/anxious.     Objective:  BP 122/85 (BP Location: Right Arm, Patient Position: Sitting, Cuff Size: Normal)   Pulse 63   Temp 98 F (36.7 C) (Oral)   Ht 5' 9.5" (1.765 m)   Wt 172 lb 9.6 oz (78.3 kg)   SpO2 100%   BMI 25.12 kg/m   BP/Weight 12/31/2017 11/14/2017 00/93/8182  Systolic BP 993 716 -  Diastolic BP 85 94 -  Wt. (Lbs) 172.6 - 162.04  BMI 25.12 - 23.93      Physical Exam  Constitutional: He is oriented to person, place, and time.  Well developed, well nourished, NAD, polite  HENT:  Head: Normocephalic and atraumatic.  Eyes: Conjunctivae are normal. No scleral icterus.  Neck: Normal range of motion. Neck supple. No thyromegaly present.  Cardiovascular: Normal rate, regular rhythm and normal heart sounds. Exam reveals no gallop and no friction rub.  No murmur heard. No LE edema bilaterally  Pulmonary/Chest: Effort normal and breath sounds normal. No respiratory  distress. He has no wheezes. He has no rales.  Abdominal: Soft. Bowel sounds are normal. There is no tenderness.  Musculoskeletal: He exhibits no edema.  Neurological: He is alert and oriented to person, place, and time. No cranial nerve deficit. Coordination normal.  Skin: Skin is warm and dry. No rash noted. No erythema. No pallor.  Psychiatric: He has a normal mood and affect. His behavior is normal. Thought content normal.  Vitals reviewed.    Assessment & Plan:    1. Chronic cough - IV ceftriaxone and azithromycin in hospital. Augmentin rx'ed at discharge. Cough still the same according to patient. Of note, WBCs normal twice at hospital. XR found consolidation in the right middle lobe and recommended to have repeat to look for underlying mass. Pt asymptomatic otherwise. Sending patient for cardiac evaluation due to abnormal EKG and elevated Troponin at time of admission. Of note patient is a hemodialysis pt. - Begin levofloxacin (LEVAQUIN) 750 MG tablet; Take 1 tablet (750 mg total) by mouth daily for 5 days.  Dispense: 5 tablet; Refill: 0 - DG Chest 2 View; Future - CBC with Differential - TB Skin Test; Future  2. Tobacco use disorder - Pt wants to quit on his own accord and has declined treatment.  3. Nonspecific abnormal electrocardiogram (ECG) (EKG) - Ambulatory referral to Cardiology   Meds ordered this encounter  Medications  . levofloxacin (LEVAQUIN) 750 MG tablet    Sig: Take 1 tablet (750 mg total) by mouth daily for 5 days.    Dispense:  5 tablet    Refill:  0    Order Specific Question:   Supervising Provider    Answer:   Tresa Garter [id number]    Follow-up: Return in about 2 weeks (around 01/14/2018) for cough.   Clent Demark PA

## 2017-12-31 NOTE — Progress Notes (Signed)
Pt was told at hospital last month that he had a touch of pneumonia

## 2017-12-31 NOTE — Patient Instructions (Signed)

## 2018-01-01 ENCOUNTER — Other Ambulatory Visit (INDEPENDENT_AMBULATORY_CARE_PROVIDER_SITE_OTHER): Payer: Self-pay | Admitting: Physician Assistant

## 2018-01-01 ENCOUNTER — Telehealth (INDEPENDENT_AMBULATORY_CARE_PROVIDER_SITE_OTHER): Payer: Self-pay

## 2018-01-01 DIAGNOSIS — R918 Other nonspecific abnormal finding of lung field: Secondary | ICD-10-CM

## 2018-01-01 DIAGNOSIS — N2581 Secondary hyperparathyroidism of renal origin: Secondary | ICD-10-CM | POA: Diagnosis not present

## 2018-01-01 DIAGNOSIS — D509 Iron deficiency anemia, unspecified: Secondary | ICD-10-CM | POA: Diagnosis not present

## 2018-01-01 DIAGNOSIS — N186 End stage renal disease: Secondary | ICD-10-CM | POA: Diagnosis not present

## 2018-01-01 LAB — CBC WITH DIFFERENTIAL/PLATELET
BASOS ABS: 0 10*3/uL (ref 0.0–0.2)
Basos: 1 %
EOS (ABSOLUTE): 0.1 10*3/uL (ref 0.0–0.4)
Eos: 3 %
Hematocrit: 37.4 % — ABNORMAL LOW (ref 37.5–51.0)
Hemoglobin: 12.3 g/dL — ABNORMAL LOW (ref 13.0–17.7)
Immature Grans (Abs): 0 10*3/uL (ref 0.0–0.1)
Immature Granulocytes: 0 %
LYMPHS ABS: 1 10*3/uL (ref 0.7–3.1)
Lymphs: 25 %
MCH: 32.6 pg (ref 26.6–33.0)
MCHC: 32.9 g/dL (ref 31.5–35.7)
MCV: 99 fL — ABNORMAL HIGH (ref 79–97)
Monocytes Absolute: 0.5 10*3/uL (ref 0.1–0.9)
Monocytes: 12 %
NEUTROS ABS: 2.4 10*3/uL (ref 1.4–7.0)
Neutrophils: 59 %
PLATELETS: 176 10*3/uL (ref 150–379)
RBC: 3.77 x10E6/uL — ABNORMAL LOW (ref 4.14–5.80)
RDW: 18.8 % — ABNORMAL HIGH (ref 12.3–15.4)
WBC: 3.9 10*3/uL (ref 3.4–10.8)

## 2018-01-01 NOTE — Telephone Encounter (Signed)
Patient is aware of lab results and Xray results, has been scheduled for CT on 01/07/17 @ 3:30, he is aware of CT date and time and to have nothing by mouth 4 hours prior to CT. Nat Christen, CMA

## 2018-01-01 NOTE — Telephone Encounter (Signed)
-----   Message from Clent Demark, PA-C sent at 01/01/2018  8:22 AM EST ----- The consolidation in the right middle lobe is persistant. I am ordering a chest CT now, please try to get him scheduled.

## 2018-01-04 DIAGNOSIS — N186 End stage renal disease: Secondary | ICD-10-CM | POA: Diagnosis not present

## 2018-01-04 DIAGNOSIS — D509 Iron deficiency anemia, unspecified: Secondary | ICD-10-CM | POA: Diagnosis not present

## 2018-01-04 DIAGNOSIS — N2581 Secondary hyperparathyroidism of renal origin: Secondary | ICD-10-CM | POA: Diagnosis not present

## 2018-01-06 DIAGNOSIS — N186 End stage renal disease: Secondary | ICD-10-CM | POA: Diagnosis not present

## 2018-01-06 DIAGNOSIS — D509 Iron deficiency anemia, unspecified: Secondary | ICD-10-CM | POA: Diagnosis not present

## 2018-01-06 DIAGNOSIS — N2581 Secondary hyperparathyroidism of renal origin: Secondary | ICD-10-CM | POA: Diagnosis not present

## 2018-01-07 ENCOUNTER — Ambulatory Visit (HOSPITAL_COMMUNITY): Admission: RE | Admit: 2018-01-07 | Payer: Medicare Other | Source: Ambulatory Visit

## 2018-01-08 DIAGNOSIS — N186 End stage renal disease: Secondary | ICD-10-CM | POA: Diagnosis not present

## 2018-01-08 DIAGNOSIS — N2581 Secondary hyperparathyroidism of renal origin: Secondary | ICD-10-CM | POA: Diagnosis not present

## 2018-01-08 DIAGNOSIS — D509 Iron deficiency anemia, unspecified: Secondary | ICD-10-CM | POA: Diagnosis not present

## 2018-01-11 DIAGNOSIS — N2581 Secondary hyperparathyroidism of renal origin: Secondary | ICD-10-CM | POA: Diagnosis not present

## 2018-01-11 DIAGNOSIS — D509 Iron deficiency anemia, unspecified: Secondary | ICD-10-CM | POA: Diagnosis not present

## 2018-01-11 DIAGNOSIS — N186 End stage renal disease: Secondary | ICD-10-CM | POA: Diagnosis not present

## 2018-01-13 DIAGNOSIS — N2581 Secondary hyperparathyroidism of renal origin: Secondary | ICD-10-CM | POA: Diagnosis not present

## 2018-01-13 DIAGNOSIS — D509 Iron deficiency anemia, unspecified: Secondary | ICD-10-CM | POA: Diagnosis not present

## 2018-01-13 DIAGNOSIS — N186 End stage renal disease: Secondary | ICD-10-CM | POA: Diagnosis not present

## 2018-01-14 DIAGNOSIS — N186 End stage renal disease: Secondary | ICD-10-CM | POA: Diagnosis not present

## 2018-01-14 DIAGNOSIS — Z992 Dependence on renal dialysis: Secondary | ICD-10-CM | POA: Diagnosis not present

## 2018-01-14 DIAGNOSIS — I120 Hypertensive chronic kidney disease with stage 5 chronic kidney disease or end stage renal disease: Secondary | ICD-10-CM | POA: Diagnosis not present

## 2018-01-15 DIAGNOSIS — Z992 Dependence on renal dialysis: Secondary | ICD-10-CM | POA: Diagnosis not present

## 2018-01-15 DIAGNOSIS — N186 End stage renal disease: Secondary | ICD-10-CM | POA: Diagnosis not present

## 2018-01-15 DIAGNOSIS — D509 Iron deficiency anemia, unspecified: Secondary | ICD-10-CM | POA: Diagnosis not present

## 2018-01-15 DIAGNOSIS — N2581 Secondary hyperparathyroidism of renal origin: Secondary | ICD-10-CM | POA: Diagnosis not present

## 2018-01-15 DIAGNOSIS — I120 Hypertensive chronic kidney disease with stage 5 chronic kidney disease or end stage renal disease: Secondary | ICD-10-CM | POA: Diagnosis not present

## 2018-01-18 DIAGNOSIS — N186 End stage renal disease: Secondary | ICD-10-CM | POA: Diagnosis not present

## 2018-01-18 DIAGNOSIS — N2581 Secondary hyperparathyroidism of renal origin: Secondary | ICD-10-CM | POA: Diagnosis not present

## 2018-01-18 DIAGNOSIS — D509 Iron deficiency anemia, unspecified: Secondary | ICD-10-CM | POA: Diagnosis not present

## 2018-01-20 DIAGNOSIS — N2581 Secondary hyperparathyroidism of renal origin: Secondary | ICD-10-CM | POA: Diagnosis not present

## 2018-01-20 DIAGNOSIS — D509 Iron deficiency anemia, unspecified: Secondary | ICD-10-CM | POA: Diagnosis not present

## 2018-01-20 DIAGNOSIS — N186 End stage renal disease: Secondary | ICD-10-CM | POA: Diagnosis not present

## 2018-01-22 DIAGNOSIS — N2581 Secondary hyperparathyroidism of renal origin: Secondary | ICD-10-CM | POA: Diagnosis not present

## 2018-01-22 DIAGNOSIS — D509 Iron deficiency anemia, unspecified: Secondary | ICD-10-CM | POA: Diagnosis not present

## 2018-01-22 DIAGNOSIS — N186 End stage renal disease: Secondary | ICD-10-CM | POA: Diagnosis not present

## 2018-01-25 DIAGNOSIS — N186 End stage renal disease: Secondary | ICD-10-CM | POA: Diagnosis not present

## 2018-01-25 DIAGNOSIS — N2581 Secondary hyperparathyroidism of renal origin: Secondary | ICD-10-CM | POA: Diagnosis not present

## 2018-01-25 DIAGNOSIS — D509 Iron deficiency anemia, unspecified: Secondary | ICD-10-CM | POA: Diagnosis not present

## 2018-01-27 DIAGNOSIS — D509 Iron deficiency anemia, unspecified: Secondary | ICD-10-CM | POA: Diagnosis not present

## 2018-01-27 DIAGNOSIS — N2581 Secondary hyperparathyroidism of renal origin: Secondary | ICD-10-CM | POA: Diagnosis not present

## 2018-01-27 DIAGNOSIS — N186 End stage renal disease: Secondary | ICD-10-CM | POA: Diagnosis not present

## 2018-02-01 DIAGNOSIS — N2581 Secondary hyperparathyroidism of renal origin: Secondary | ICD-10-CM | POA: Diagnosis not present

## 2018-02-01 DIAGNOSIS — N186 End stage renal disease: Secondary | ICD-10-CM | POA: Diagnosis not present

## 2018-02-01 DIAGNOSIS — D509 Iron deficiency anemia, unspecified: Secondary | ICD-10-CM | POA: Diagnosis not present

## 2018-02-03 DIAGNOSIS — N186 End stage renal disease: Secondary | ICD-10-CM | POA: Diagnosis not present

## 2018-02-03 DIAGNOSIS — N2581 Secondary hyperparathyroidism of renal origin: Secondary | ICD-10-CM | POA: Diagnosis not present

## 2018-02-03 DIAGNOSIS — D509 Iron deficiency anemia, unspecified: Secondary | ICD-10-CM | POA: Diagnosis not present

## 2018-02-08 DIAGNOSIS — N186 End stage renal disease: Secondary | ICD-10-CM | POA: Diagnosis not present

## 2018-02-08 DIAGNOSIS — D509 Iron deficiency anemia, unspecified: Secondary | ICD-10-CM | POA: Diagnosis not present

## 2018-02-08 DIAGNOSIS — N2581 Secondary hyperparathyroidism of renal origin: Secondary | ICD-10-CM | POA: Diagnosis not present

## 2018-02-10 DIAGNOSIS — N186 End stage renal disease: Secondary | ICD-10-CM | POA: Diagnosis not present

## 2018-02-10 DIAGNOSIS — N2581 Secondary hyperparathyroidism of renal origin: Secondary | ICD-10-CM | POA: Diagnosis not present

## 2018-02-10 DIAGNOSIS — D509 Iron deficiency anemia, unspecified: Secondary | ICD-10-CM | POA: Diagnosis not present

## 2018-02-12 DIAGNOSIS — D509 Iron deficiency anemia, unspecified: Secondary | ICD-10-CM | POA: Diagnosis not present

## 2018-02-12 DIAGNOSIS — N2581 Secondary hyperparathyroidism of renal origin: Secondary | ICD-10-CM | POA: Diagnosis not present

## 2018-02-12 DIAGNOSIS — I120 Hypertensive chronic kidney disease with stage 5 chronic kidney disease or end stage renal disease: Secondary | ICD-10-CM | POA: Diagnosis not present

## 2018-02-12 DIAGNOSIS — N186 End stage renal disease: Secondary | ICD-10-CM | POA: Diagnosis not present

## 2018-02-12 DIAGNOSIS — Z992 Dependence on renal dialysis: Secondary | ICD-10-CM | POA: Diagnosis not present

## 2018-02-15 DIAGNOSIS — N2581 Secondary hyperparathyroidism of renal origin: Secondary | ICD-10-CM | POA: Diagnosis not present

## 2018-02-15 DIAGNOSIS — D509 Iron deficiency anemia, unspecified: Secondary | ICD-10-CM | POA: Diagnosis not present

## 2018-02-15 DIAGNOSIS — N186 End stage renal disease: Secondary | ICD-10-CM | POA: Diagnosis not present

## 2018-02-17 DIAGNOSIS — N186 End stage renal disease: Secondary | ICD-10-CM | POA: Diagnosis not present

## 2018-02-17 DIAGNOSIS — D509 Iron deficiency anemia, unspecified: Secondary | ICD-10-CM | POA: Diagnosis not present

## 2018-02-17 DIAGNOSIS — N2581 Secondary hyperparathyroidism of renal origin: Secondary | ICD-10-CM | POA: Diagnosis not present

## 2018-02-19 DIAGNOSIS — N186 End stage renal disease: Secondary | ICD-10-CM | POA: Diagnosis not present

## 2018-02-19 DIAGNOSIS — N2581 Secondary hyperparathyroidism of renal origin: Secondary | ICD-10-CM | POA: Diagnosis not present

## 2018-02-19 DIAGNOSIS — D509 Iron deficiency anemia, unspecified: Secondary | ICD-10-CM | POA: Diagnosis not present

## 2018-02-22 DIAGNOSIS — N2581 Secondary hyperparathyroidism of renal origin: Secondary | ICD-10-CM | POA: Diagnosis not present

## 2018-02-22 DIAGNOSIS — N186 End stage renal disease: Secondary | ICD-10-CM | POA: Diagnosis not present

## 2018-02-22 DIAGNOSIS — D509 Iron deficiency anemia, unspecified: Secondary | ICD-10-CM | POA: Diagnosis not present

## 2018-02-26 DIAGNOSIS — N186 End stage renal disease: Secondary | ICD-10-CM | POA: Diagnosis not present

## 2018-02-26 DIAGNOSIS — D509 Iron deficiency anemia, unspecified: Secondary | ICD-10-CM | POA: Diagnosis not present

## 2018-02-26 DIAGNOSIS — N2581 Secondary hyperparathyroidism of renal origin: Secondary | ICD-10-CM | POA: Diagnosis not present

## 2018-03-01 DIAGNOSIS — N2581 Secondary hyperparathyroidism of renal origin: Secondary | ICD-10-CM | POA: Diagnosis not present

## 2018-03-01 DIAGNOSIS — N186 End stage renal disease: Secondary | ICD-10-CM | POA: Diagnosis not present

## 2018-03-01 DIAGNOSIS — D509 Iron deficiency anemia, unspecified: Secondary | ICD-10-CM | POA: Diagnosis not present

## 2018-03-03 DIAGNOSIS — D509 Iron deficiency anemia, unspecified: Secondary | ICD-10-CM | POA: Diagnosis not present

## 2018-03-03 DIAGNOSIS — N186 End stage renal disease: Secondary | ICD-10-CM | POA: Diagnosis not present

## 2018-03-03 DIAGNOSIS — N2581 Secondary hyperparathyroidism of renal origin: Secondary | ICD-10-CM | POA: Diagnosis not present

## 2018-03-05 DIAGNOSIS — D509 Iron deficiency anemia, unspecified: Secondary | ICD-10-CM | POA: Diagnosis not present

## 2018-03-05 DIAGNOSIS — N2581 Secondary hyperparathyroidism of renal origin: Secondary | ICD-10-CM | POA: Diagnosis not present

## 2018-03-05 DIAGNOSIS — N186 End stage renal disease: Secondary | ICD-10-CM | POA: Diagnosis not present

## 2018-03-08 DIAGNOSIS — D509 Iron deficiency anemia, unspecified: Secondary | ICD-10-CM | POA: Diagnosis not present

## 2018-03-08 DIAGNOSIS — N2581 Secondary hyperparathyroidism of renal origin: Secondary | ICD-10-CM | POA: Diagnosis not present

## 2018-03-08 DIAGNOSIS — N186 End stage renal disease: Secondary | ICD-10-CM | POA: Diagnosis not present

## 2018-03-10 DIAGNOSIS — D509 Iron deficiency anemia, unspecified: Secondary | ICD-10-CM | POA: Diagnosis not present

## 2018-03-10 DIAGNOSIS — N2581 Secondary hyperparathyroidism of renal origin: Secondary | ICD-10-CM | POA: Diagnosis not present

## 2018-03-10 DIAGNOSIS — N186 End stage renal disease: Secondary | ICD-10-CM | POA: Diagnosis not present

## 2018-03-12 DIAGNOSIS — N2581 Secondary hyperparathyroidism of renal origin: Secondary | ICD-10-CM | POA: Diagnosis not present

## 2018-03-12 DIAGNOSIS — N186 End stage renal disease: Secondary | ICD-10-CM | POA: Diagnosis not present

## 2018-03-12 DIAGNOSIS — D509 Iron deficiency anemia, unspecified: Secondary | ICD-10-CM | POA: Diagnosis not present

## 2018-03-15 DIAGNOSIS — N186 End stage renal disease: Secondary | ICD-10-CM | POA: Diagnosis not present

## 2018-03-15 DIAGNOSIS — I120 Hypertensive chronic kidney disease with stage 5 chronic kidney disease or end stage renal disease: Secondary | ICD-10-CM | POA: Diagnosis not present

## 2018-03-15 DIAGNOSIS — D509 Iron deficiency anemia, unspecified: Secondary | ICD-10-CM | POA: Diagnosis not present

## 2018-03-15 DIAGNOSIS — N2581 Secondary hyperparathyroidism of renal origin: Secondary | ICD-10-CM | POA: Diagnosis not present

## 2018-03-15 DIAGNOSIS — Z992 Dependence on renal dialysis: Secondary | ICD-10-CM | POA: Diagnosis not present

## 2018-03-17 DIAGNOSIS — N186 End stage renal disease: Secondary | ICD-10-CM | POA: Diagnosis not present

## 2018-03-17 DIAGNOSIS — D509 Iron deficiency anemia, unspecified: Secondary | ICD-10-CM | POA: Diagnosis not present

## 2018-03-17 DIAGNOSIS — N2581 Secondary hyperparathyroidism of renal origin: Secondary | ICD-10-CM | POA: Diagnosis not present

## 2018-03-19 DIAGNOSIS — N186 End stage renal disease: Secondary | ICD-10-CM | POA: Diagnosis not present

## 2018-03-19 DIAGNOSIS — D509 Iron deficiency anemia, unspecified: Secondary | ICD-10-CM | POA: Diagnosis not present

## 2018-03-19 DIAGNOSIS — N2581 Secondary hyperparathyroidism of renal origin: Secondary | ICD-10-CM | POA: Diagnosis not present

## 2018-03-22 DIAGNOSIS — D509 Iron deficiency anemia, unspecified: Secondary | ICD-10-CM | POA: Diagnosis not present

## 2018-03-22 DIAGNOSIS — N186 End stage renal disease: Secondary | ICD-10-CM | POA: Diagnosis not present

## 2018-03-22 DIAGNOSIS — N2581 Secondary hyperparathyroidism of renal origin: Secondary | ICD-10-CM | POA: Diagnosis not present

## 2018-03-24 DIAGNOSIS — N2581 Secondary hyperparathyroidism of renal origin: Secondary | ICD-10-CM | POA: Diagnosis not present

## 2018-03-24 DIAGNOSIS — N186 End stage renal disease: Secondary | ICD-10-CM | POA: Diagnosis not present

## 2018-03-24 DIAGNOSIS — D509 Iron deficiency anemia, unspecified: Secondary | ICD-10-CM | POA: Diagnosis not present

## 2018-03-26 DIAGNOSIS — N186 End stage renal disease: Secondary | ICD-10-CM | POA: Diagnosis not present

## 2018-03-26 DIAGNOSIS — D509 Iron deficiency anemia, unspecified: Secondary | ICD-10-CM | POA: Diagnosis not present

## 2018-03-26 DIAGNOSIS — N2581 Secondary hyperparathyroidism of renal origin: Secondary | ICD-10-CM | POA: Diagnosis not present

## 2018-03-29 DIAGNOSIS — D509 Iron deficiency anemia, unspecified: Secondary | ICD-10-CM | POA: Diagnosis not present

## 2018-03-29 DIAGNOSIS — N186 End stage renal disease: Secondary | ICD-10-CM | POA: Diagnosis not present

## 2018-03-29 DIAGNOSIS — N2581 Secondary hyperparathyroidism of renal origin: Secondary | ICD-10-CM | POA: Diagnosis not present

## 2018-03-31 ENCOUNTER — Telehealth: Payer: Self-pay | Admitting: *Deleted

## 2018-03-31 DIAGNOSIS — D509 Iron deficiency anemia, unspecified: Secondary | ICD-10-CM | POA: Diagnosis not present

## 2018-03-31 DIAGNOSIS — N186 End stage renal disease: Secondary | ICD-10-CM | POA: Diagnosis not present

## 2018-03-31 DIAGNOSIS — N2581 Secondary hyperparathyroidism of renal origin: Secondary | ICD-10-CM | POA: Diagnosis not present

## 2018-03-31 NOTE — Telephone Encounter (Signed)
Attempted to call patient back. Number in chart is not in service and number he left on voice mail is "wrong number" per person who answers.

## 2018-04-05 DIAGNOSIS — N2581 Secondary hyperparathyroidism of renal origin: Secondary | ICD-10-CM | POA: Diagnosis not present

## 2018-04-05 DIAGNOSIS — D509 Iron deficiency anemia, unspecified: Secondary | ICD-10-CM | POA: Diagnosis not present

## 2018-04-05 DIAGNOSIS — N186 End stage renal disease: Secondary | ICD-10-CM | POA: Diagnosis not present

## 2018-04-07 DIAGNOSIS — N2581 Secondary hyperparathyroidism of renal origin: Secondary | ICD-10-CM | POA: Diagnosis not present

## 2018-04-07 DIAGNOSIS — N186 End stage renal disease: Secondary | ICD-10-CM | POA: Diagnosis not present

## 2018-04-07 DIAGNOSIS — D509 Iron deficiency anemia, unspecified: Secondary | ICD-10-CM | POA: Diagnosis not present

## 2018-04-09 DIAGNOSIS — N186 End stage renal disease: Secondary | ICD-10-CM | POA: Diagnosis not present

## 2018-04-09 DIAGNOSIS — D509 Iron deficiency anemia, unspecified: Secondary | ICD-10-CM | POA: Diagnosis not present

## 2018-04-09 DIAGNOSIS — N2581 Secondary hyperparathyroidism of renal origin: Secondary | ICD-10-CM | POA: Diagnosis not present

## 2018-04-12 DIAGNOSIS — N2581 Secondary hyperparathyroidism of renal origin: Secondary | ICD-10-CM | POA: Diagnosis not present

## 2018-04-12 DIAGNOSIS — N186 End stage renal disease: Secondary | ICD-10-CM | POA: Diagnosis not present

## 2018-04-12 DIAGNOSIS — D509 Iron deficiency anemia, unspecified: Secondary | ICD-10-CM | POA: Diagnosis not present

## 2018-04-14 DIAGNOSIS — I120 Hypertensive chronic kidney disease with stage 5 chronic kidney disease or end stage renal disease: Secondary | ICD-10-CM | POA: Diagnosis not present

## 2018-04-14 DIAGNOSIS — N186 End stage renal disease: Secondary | ICD-10-CM | POA: Diagnosis not present

## 2018-04-14 DIAGNOSIS — D509 Iron deficiency anemia, unspecified: Secondary | ICD-10-CM | POA: Diagnosis not present

## 2018-04-14 DIAGNOSIS — Z992 Dependence on renal dialysis: Secondary | ICD-10-CM | POA: Diagnosis not present

## 2018-04-14 DIAGNOSIS — N2581 Secondary hyperparathyroidism of renal origin: Secondary | ICD-10-CM | POA: Diagnosis not present

## 2018-04-19 DIAGNOSIS — N186 End stage renal disease: Secondary | ICD-10-CM | POA: Diagnosis not present

## 2018-04-19 DIAGNOSIS — N2581 Secondary hyperparathyroidism of renal origin: Secondary | ICD-10-CM | POA: Diagnosis not present

## 2018-04-19 DIAGNOSIS — D509 Iron deficiency anemia, unspecified: Secondary | ICD-10-CM | POA: Diagnosis not present

## 2018-04-21 DIAGNOSIS — D509 Iron deficiency anemia, unspecified: Secondary | ICD-10-CM | POA: Diagnosis not present

## 2018-04-21 DIAGNOSIS — N186 End stage renal disease: Secondary | ICD-10-CM | POA: Diagnosis not present

## 2018-04-21 DIAGNOSIS — N2581 Secondary hyperparathyroidism of renal origin: Secondary | ICD-10-CM | POA: Diagnosis not present

## 2018-04-23 DIAGNOSIS — D509 Iron deficiency anemia, unspecified: Secondary | ICD-10-CM | POA: Diagnosis not present

## 2018-04-23 DIAGNOSIS — N186 End stage renal disease: Secondary | ICD-10-CM | POA: Diagnosis not present

## 2018-04-23 DIAGNOSIS — N2581 Secondary hyperparathyroidism of renal origin: Secondary | ICD-10-CM | POA: Diagnosis not present

## 2018-04-26 DIAGNOSIS — N186 End stage renal disease: Secondary | ICD-10-CM | POA: Diagnosis not present

## 2018-04-26 DIAGNOSIS — D509 Iron deficiency anemia, unspecified: Secondary | ICD-10-CM | POA: Diagnosis not present

## 2018-04-26 DIAGNOSIS — N2581 Secondary hyperparathyroidism of renal origin: Secondary | ICD-10-CM | POA: Diagnosis not present

## 2018-04-28 DIAGNOSIS — N2581 Secondary hyperparathyroidism of renal origin: Secondary | ICD-10-CM | POA: Diagnosis not present

## 2018-04-28 DIAGNOSIS — N186 End stage renal disease: Secondary | ICD-10-CM | POA: Diagnosis not present

## 2018-04-28 DIAGNOSIS — D509 Iron deficiency anemia, unspecified: Secondary | ICD-10-CM | POA: Diagnosis not present

## 2018-04-30 DIAGNOSIS — D509 Iron deficiency anemia, unspecified: Secondary | ICD-10-CM | POA: Diagnosis not present

## 2018-04-30 DIAGNOSIS — N2581 Secondary hyperparathyroidism of renal origin: Secondary | ICD-10-CM | POA: Diagnosis not present

## 2018-04-30 DIAGNOSIS — N186 End stage renal disease: Secondary | ICD-10-CM | POA: Diagnosis not present

## 2018-05-03 DIAGNOSIS — D509 Iron deficiency anemia, unspecified: Secondary | ICD-10-CM | POA: Diagnosis not present

## 2018-05-03 DIAGNOSIS — N186 End stage renal disease: Secondary | ICD-10-CM | POA: Diagnosis not present

## 2018-05-03 DIAGNOSIS — N2581 Secondary hyperparathyroidism of renal origin: Secondary | ICD-10-CM | POA: Diagnosis not present

## 2018-05-05 DIAGNOSIS — N186 End stage renal disease: Secondary | ICD-10-CM | POA: Diagnosis not present

## 2018-05-05 DIAGNOSIS — D509 Iron deficiency anemia, unspecified: Secondary | ICD-10-CM | POA: Diagnosis not present

## 2018-05-05 DIAGNOSIS — N2581 Secondary hyperparathyroidism of renal origin: Secondary | ICD-10-CM | POA: Diagnosis not present

## 2018-05-07 DIAGNOSIS — N186 End stage renal disease: Secondary | ICD-10-CM | POA: Diagnosis not present

## 2018-05-07 DIAGNOSIS — D509 Iron deficiency anemia, unspecified: Secondary | ICD-10-CM | POA: Diagnosis not present

## 2018-05-07 DIAGNOSIS — N2581 Secondary hyperparathyroidism of renal origin: Secondary | ICD-10-CM | POA: Diagnosis not present

## 2018-05-10 DIAGNOSIS — N186 End stage renal disease: Secondary | ICD-10-CM | POA: Diagnosis not present

## 2018-05-10 DIAGNOSIS — D509 Iron deficiency anemia, unspecified: Secondary | ICD-10-CM | POA: Diagnosis not present

## 2018-05-10 DIAGNOSIS — N2581 Secondary hyperparathyroidism of renal origin: Secondary | ICD-10-CM | POA: Diagnosis not present

## 2018-05-14 DIAGNOSIS — D509 Iron deficiency anemia, unspecified: Secondary | ICD-10-CM | POA: Diagnosis not present

## 2018-05-14 DIAGNOSIS — N186 End stage renal disease: Secondary | ICD-10-CM | POA: Diagnosis not present

## 2018-05-14 DIAGNOSIS — N2581 Secondary hyperparathyroidism of renal origin: Secondary | ICD-10-CM | POA: Diagnosis not present

## 2018-05-15 DIAGNOSIS — N186 End stage renal disease: Secondary | ICD-10-CM | POA: Diagnosis not present

## 2018-05-15 DIAGNOSIS — I120 Hypertensive chronic kidney disease with stage 5 chronic kidney disease or end stage renal disease: Secondary | ICD-10-CM | POA: Diagnosis not present

## 2018-05-15 DIAGNOSIS — Z992 Dependence on renal dialysis: Secondary | ICD-10-CM | POA: Diagnosis not present

## 2018-05-17 DIAGNOSIS — N186 End stage renal disease: Secondary | ICD-10-CM | POA: Diagnosis not present

## 2018-05-17 DIAGNOSIS — N2581 Secondary hyperparathyroidism of renal origin: Secondary | ICD-10-CM | POA: Diagnosis not present

## 2018-05-17 DIAGNOSIS — D509 Iron deficiency anemia, unspecified: Secondary | ICD-10-CM | POA: Diagnosis not present

## 2018-05-21 DIAGNOSIS — N186 End stage renal disease: Secondary | ICD-10-CM | POA: Diagnosis not present

## 2018-05-21 DIAGNOSIS — N2581 Secondary hyperparathyroidism of renal origin: Secondary | ICD-10-CM | POA: Diagnosis not present

## 2018-05-21 DIAGNOSIS — D509 Iron deficiency anemia, unspecified: Secondary | ICD-10-CM | POA: Diagnosis not present

## 2018-05-24 DIAGNOSIS — N186 End stage renal disease: Secondary | ICD-10-CM | POA: Diagnosis not present

## 2018-05-24 DIAGNOSIS — N2581 Secondary hyperparathyroidism of renal origin: Secondary | ICD-10-CM | POA: Diagnosis not present

## 2018-05-24 DIAGNOSIS — D509 Iron deficiency anemia, unspecified: Secondary | ICD-10-CM | POA: Diagnosis not present

## 2018-05-26 DIAGNOSIS — N186 End stage renal disease: Secondary | ICD-10-CM | POA: Diagnosis not present

## 2018-05-26 DIAGNOSIS — N2581 Secondary hyperparathyroidism of renal origin: Secondary | ICD-10-CM | POA: Diagnosis not present

## 2018-05-26 DIAGNOSIS — D509 Iron deficiency anemia, unspecified: Secondary | ICD-10-CM | POA: Diagnosis not present

## 2018-05-28 DIAGNOSIS — D509 Iron deficiency anemia, unspecified: Secondary | ICD-10-CM | POA: Diagnosis not present

## 2018-05-28 DIAGNOSIS — N186 End stage renal disease: Secondary | ICD-10-CM | POA: Diagnosis not present

## 2018-05-28 DIAGNOSIS — N2581 Secondary hyperparathyroidism of renal origin: Secondary | ICD-10-CM | POA: Diagnosis not present

## 2018-05-31 DIAGNOSIS — N2581 Secondary hyperparathyroidism of renal origin: Secondary | ICD-10-CM | POA: Diagnosis not present

## 2018-05-31 DIAGNOSIS — D509 Iron deficiency anemia, unspecified: Secondary | ICD-10-CM | POA: Diagnosis not present

## 2018-05-31 DIAGNOSIS — N186 End stage renal disease: Secondary | ICD-10-CM | POA: Diagnosis not present

## 2018-06-02 DIAGNOSIS — D509 Iron deficiency anemia, unspecified: Secondary | ICD-10-CM | POA: Diagnosis not present

## 2018-06-02 DIAGNOSIS — N186 End stage renal disease: Secondary | ICD-10-CM | POA: Diagnosis not present

## 2018-06-02 DIAGNOSIS — N2581 Secondary hyperparathyroidism of renal origin: Secondary | ICD-10-CM | POA: Diagnosis not present

## 2018-06-04 DIAGNOSIS — D509 Iron deficiency anemia, unspecified: Secondary | ICD-10-CM | POA: Diagnosis not present

## 2018-06-04 DIAGNOSIS — N2581 Secondary hyperparathyroidism of renal origin: Secondary | ICD-10-CM | POA: Diagnosis not present

## 2018-06-04 DIAGNOSIS — N186 End stage renal disease: Secondary | ICD-10-CM | POA: Diagnosis not present

## 2018-06-07 DIAGNOSIS — N186 End stage renal disease: Secondary | ICD-10-CM | POA: Diagnosis not present

## 2018-06-07 DIAGNOSIS — D509 Iron deficiency anemia, unspecified: Secondary | ICD-10-CM | POA: Diagnosis not present

## 2018-06-07 DIAGNOSIS — N2581 Secondary hyperparathyroidism of renal origin: Secondary | ICD-10-CM | POA: Diagnosis not present

## 2018-06-09 DIAGNOSIS — N2581 Secondary hyperparathyroidism of renal origin: Secondary | ICD-10-CM | POA: Diagnosis not present

## 2018-06-09 DIAGNOSIS — N186 End stage renal disease: Secondary | ICD-10-CM | POA: Diagnosis not present

## 2018-06-09 DIAGNOSIS — D509 Iron deficiency anemia, unspecified: Secondary | ICD-10-CM | POA: Diagnosis not present

## 2018-06-11 DIAGNOSIS — D509 Iron deficiency anemia, unspecified: Secondary | ICD-10-CM | POA: Diagnosis not present

## 2018-06-11 DIAGNOSIS — N2581 Secondary hyperparathyroidism of renal origin: Secondary | ICD-10-CM | POA: Diagnosis not present

## 2018-06-11 DIAGNOSIS — N186 End stage renal disease: Secondary | ICD-10-CM | POA: Diagnosis not present

## 2018-06-14 DIAGNOSIS — D509 Iron deficiency anemia, unspecified: Secondary | ICD-10-CM | POA: Diagnosis not present

## 2018-06-14 DIAGNOSIS — Z992 Dependence on renal dialysis: Secondary | ICD-10-CM | POA: Diagnosis not present

## 2018-06-14 DIAGNOSIS — I120 Hypertensive chronic kidney disease with stage 5 chronic kidney disease or end stage renal disease: Secondary | ICD-10-CM | POA: Diagnosis not present

## 2018-06-14 DIAGNOSIS — N186 End stage renal disease: Secondary | ICD-10-CM | POA: Diagnosis not present

## 2018-06-14 DIAGNOSIS — N2581 Secondary hyperparathyroidism of renal origin: Secondary | ICD-10-CM | POA: Diagnosis not present

## 2018-06-16 DIAGNOSIS — N186 End stage renal disease: Secondary | ICD-10-CM | POA: Diagnosis not present

## 2018-06-16 DIAGNOSIS — D509 Iron deficiency anemia, unspecified: Secondary | ICD-10-CM | POA: Diagnosis not present

## 2018-06-16 DIAGNOSIS — N2581 Secondary hyperparathyroidism of renal origin: Secondary | ICD-10-CM | POA: Diagnosis not present

## 2018-06-18 DIAGNOSIS — D509 Iron deficiency anemia, unspecified: Secondary | ICD-10-CM | POA: Diagnosis not present

## 2018-06-18 DIAGNOSIS — N2581 Secondary hyperparathyroidism of renal origin: Secondary | ICD-10-CM | POA: Diagnosis not present

## 2018-06-18 DIAGNOSIS — N186 End stage renal disease: Secondary | ICD-10-CM | POA: Diagnosis not present

## 2018-06-21 DIAGNOSIS — N186 End stage renal disease: Secondary | ICD-10-CM | POA: Diagnosis not present

## 2018-06-21 DIAGNOSIS — N2581 Secondary hyperparathyroidism of renal origin: Secondary | ICD-10-CM | POA: Diagnosis not present

## 2018-06-21 DIAGNOSIS — D509 Iron deficiency anemia, unspecified: Secondary | ICD-10-CM | POA: Diagnosis not present

## 2018-06-23 DIAGNOSIS — N2581 Secondary hyperparathyroidism of renal origin: Secondary | ICD-10-CM | POA: Diagnosis not present

## 2018-06-23 DIAGNOSIS — N186 End stage renal disease: Secondary | ICD-10-CM | POA: Diagnosis not present

## 2018-06-23 DIAGNOSIS — D509 Iron deficiency anemia, unspecified: Secondary | ICD-10-CM | POA: Diagnosis not present

## 2018-06-25 DIAGNOSIS — N2581 Secondary hyperparathyroidism of renal origin: Secondary | ICD-10-CM | POA: Diagnosis not present

## 2018-06-25 DIAGNOSIS — D509 Iron deficiency anemia, unspecified: Secondary | ICD-10-CM | POA: Diagnosis not present

## 2018-06-25 DIAGNOSIS — N186 End stage renal disease: Secondary | ICD-10-CM | POA: Diagnosis not present

## 2018-06-28 DIAGNOSIS — D509 Iron deficiency anemia, unspecified: Secondary | ICD-10-CM | POA: Diagnosis not present

## 2018-06-28 DIAGNOSIS — N186 End stage renal disease: Secondary | ICD-10-CM | POA: Diagnosis not present

## 2018-06-28 DIAGNOSIS — N2581 Secondary hyperparathyroidism of renal origin: Secondary | ICD-10-CM | POA: Diagnosis not present

## 2018-06-30 DIAGNOSIS — N2581 Secondary hyperparathyroidism of renal origin: Secondary | ICD-10-CM | POA: Diagnosis not present

## 2018-06-30 DIAGNOSIS — D509 Iron deficiency anemia, unspecified: Secondary | ICD-10-CM | POA: Diagnosis not present

## 2018-06-30 DIAGNOSIS — N186 End stage renal disease: Secondary | ICD-10-CM | POA: Diagnosis not present

## 2018-07-05 DIAGNOSIS — D509 Iron deficiency anemia, unspecified: Secondary | ICD-10-CM | POA: Diagnosis not present

## 2018-07-05 DIAGNOSIS — N186 End stage renal disease: Secondary | ICD-10-CM | POA: Diagnosis not present

## 2018-07-05 DIAGNOSIS — N2581 Secondary hyperparathyroidism of renal origin: Secondary | ICD-10-CM | POA: Diagnosis not present

## 2018-07-07 DIAGNOSIS — D509 Iron deficiency anemia, unspecified: Secondary | ICD-10-CM | POA: Diagnosis not present

## 2018-07-07 DIAGNOSIS — N2581 Secondary hyperparathyroidism of renal origin: Secondary | ICD-10-CM | POA: Diagnosis not present

## 2018-07-07 DIAGNOSIS — N186 End stage renal disease: Secondary | ICD-10-CM | POA: Diagnosis not present

## 2018-07-09 DIAGNOSIS — N186 End stage renal disease: Secondary | ICD-10-CM | POA: Diagnosis not present

## 2018-07-09 DIAGNOSIS — N2581 Secondary hyperparathyroidism of renal origin: Secondary | ICD-10-CM | POA: Diagnosis not present

## 2018-07-09 DIAGNOSIS — D509 Iron deficiency anemia, unspecified: Secondary | ICD-10-CM | POA: Diagnosis not present

## 2018-07-12 DIAGNOSIS — N2581 Secondary hyperparathyroidism of renal origin: Secondary | ICD-10-CM | POA: Diagnosis not present

## 2018-07-12 DIAGNOSIS — D509 Iron deficiency anemia, unspecified: Secondary | ICD-10-CM | POA: Diagnosis not present

## 2018-07-12 DIAGNOSIS — N186 End stage renal disease: Secondary | ICD-10-CM | POA: Diagnosis not present

## 2018-07-14 DIAGNOSIS — N2581 Secondary hyperparathyroidism of renal origin: Secondary | ICD-10-CM | POA: Diagnosis not present

## 2018-07-14 DIAGNOSIS — D509 Iron deficiency anemia, unspecified: Secondary | ICD-10-CM | POA: Diagnosis not present

## 2018-07-14 DIAGNOSIS — N186 End stage renal disease: Secondary | ICD-10-CM | POA: Diagnosis not present

## 2018-07-15 DIAGNOSIS — N186 End stage renal disease: Secondary | ICD-10-CM | POA: Diagnosis not present

## 2018-07-15 DIAGNOSIS — Z992 Dependence on renal dialysis: Secondary | ICD-10-CM | POA: Diagnosis not present

## 2018-07-15 DIAGNOSIS — I120 Hypertensive chronic kidney disease with stage 5 chronic kidney disease or end stage renal disease: Secondary | ICD-10-CM | POA: Diagnosis not present

## 2018-07-16 DIAGNOSIS — N186 End stage renal disease: Secondary | ICD-10-CM | POA: Diagnosis not present

## 2018-07-16 DIAGNOSIS — N2581 Secondary hyperparathyroidism of renal origin: Secondary | ICD-10-CM | POA: Diagnosis not present

## 2018-07-19 DIAGNOSIS — N186 End stage renal disease: Secondary | ICD-10-CM | POA: Diagnosis not present

## 2018-07-19 DIAGNOSIS — N2581 Secondary hyperparathyroidism of renal origin: Secondary | ICD-10-CM | POA: Diagnosis not present

## 2018-07-23 DIAGNOSIS — N2581 Secondary hyperparathyroidism of renal origin: Secondary | ICD-10-CM | POA: Diagnosis not present

## 2018-07-23 DIAGNOSIS — N186 End stage renal disease: Secondary | ICD-10-CM | POA: Diagnosis not present

## 2018-07-26 DIAGNOSIS — N2581 Secondary hyperparathyroidism of renal origin: Secondary | ICD-10-CM | POA: Diagnosis not present

## 2018-07-26 DIAGNOSIS — N186 End stage renal disease: Secondary | ICD-10-CM | POA: Diagnosis not present

## 2018-07-28 DIAGNOSIS — N186 End stage renal disease: Secondary | ICD-10-CM | POA: Diagnosis not present

## 2018-07-28 DIAGNOSIS — N2581 Secondary hyperparathyroidism of renal origin: Secondary | ICD-10-CM | POA: Diagnosis not present

## 2018-08-02 DIAGNOSIS — N186 End stage renal disease: Secondary | ICD-10-CM | POA: Diagnosis not present

## 2018-08-02 DIAGNOSIS — N2581 Secondary hyperparathyroidism of renal origin: Secondary | ICD-10-CM | POA: Diagnosis not present

## 2018-08-04 DIAGNOSIS — N2581 Secondary hyperparathyroidism of renal origin: Secondary | ICD-10-CM | POA: Diagnosis not present

## 2018-08-04 DIAGNOSIS — N186 End stage renal disease: Secondary | ICD-10-CM | POA: Diagnosis not present

## 2018-08-06 DIAGNOSIS — N2581 Secondary hyperparathyroidism of renal origin: Secondary | ICD-10-CM | POA: Diagnosis not present

## 2018-08-06 DIAGNOSIS — N186 End stage renal disease: Secondary | ICD-10-CM | POA: Diagnosis not present

## 2018-08-09 DIAGNOSIS — N2581 Secondary hyperparathyroidism of renal origin: Secondary | ICD-10-CM | POA: Diagnosis not present

## 2018-08-09 DIAGNOSIS — N186 End stage renal disease: Secondary | ICD-10-CM | POA: Diagnosis not present

## 2018-08-10 ENCOUNTER — Other Ambulatory Visit: Payer: Self-pay

## 2018-08-10 ENCOUNTER — Ambulatory Visit (INDEPENDENT_AMBULATORY_CARE_PROVIDER_SITE_OTHER): Payer: Medicare Other | Admitting: Internal Medicine

## 2018-08-10 VITALS — BP 120/90 | HR 71 | Temp 98.5°F | Ht 69.5 in | Wt 168.7 lb

## 2018-08-10 DIAGNOSIS — I5040 Unspecified combined systolic (congestive) and diastolic (congestive) heart failure: Secondary | ICD-10-CM | POA: Diagnosis not present

## 2018-08-10 DIAGNOSIS — R911 Solitary pulmonary nodule: Secondary | ICD-10-CM | POA: Diagnosis not present

## 2018-08-10 DIAGNOSIS — I132 Hypertensive heart and chronic kidney disease with heart failure and with stage 5 chronic kidney disease, or end stage renal disease: Secondary | ICD-10-CM | POA: Diagnosis not present

## 2018-08-10 DIAGNOSIS — F1411 Cocaine abuse, in remission: Secondary | ICD-10-CM | POA: Diagnosis not present

## 2018-08-10 DIAGNOSIS — Z992 Dependence on renal dialysis: Secondary | ICD-10-CM | POA: Diagnosis not present

## 2018-08-10 DIAGNOSIS — R160 Hepatomegaly, not elsewhere classified: Secondary | ICD-10-CM | POA: Diagnosis not present

## 2018-08-10 DIAGNOSIS — Z79899 Other long term (current) drug therapy: Secondary | ICD-10-CM | POA: Diagnosis not present

## 2018-08-10 DIAGNOSIS — N2581 Secondary hyperparathyroidism of renal origin: Secondary | ICD-10-CM | POA: Diagnosis not present

## 2018-08-10 DIAGNOSIS — K4091 Unilateral inguinal hernia, without obstruction or gangrene, recurrent: Secondary | ICD-10-CM

## 2018-08-10 DIAGNOSIS — I5042 Chronic combined systolic (congestive) and diastolic (congestive) heart failure: Secondary | ICD-10-CM

## 2018-08-10 DIAGNOSIS — R188 Other ascites: Secondary | ICD-10-CM | POA: Diagnosis not present

## 2018-08-10 DIAGNOSIS — N186 End stage renal disease: Secondary | ICD-10-CM

## 2018-08-10 DIAGNOSIS — F1011 Alcohol abuse, in remission: Secondary | ICD-10-CM | POA: Diagnosis not present

## 2018-08-10 DIAGNOSIS — I428 Other cardiomyopathies: Secondary | ICD-10-CM | POA: Diagnosis not present

## 2018-08-10 DIAGNOSIS — K4090 Unilateral inguinal hernia, without obstruction or gangrene, not specified as recurrent: Secondary | ICD-10-CM

## 2018-08-10 DIAGNOSIS — R140 Abdominal distension (gaseous): Secondary | ICD-10-CM

## 2018-08-10 HISTORY — DX: Abdominal distension (gaseous): R14.0

## 2018-08-10 NOTE — Patient Instructions (Signed)
Mr. Kitchings,  It was a pleasure taking care of you at the clinic today.  The ultrasound we performed the clinic today showed that he had some fluid in your belly which could be a result of the heart failure.  I have ordered a CT scan of your abdomen, chest and also an echocardiogram to evaluate your heart.  I will see you in the clinic in 1 to 2 months for follow-up.  Please follow-up with cardiologist Dr. Adam Phenix.  ~Dr. Eileen Stanford

## 2018-08-10 NOTE — Progress Notes (Signed)
CC: Abdominal distension, establish care   HPI:  [name] is a 52 y.o. 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  Abdominal distention: [name] has nonischemic cardiomyopathy, combined systolic and diastolic heart failure EF 20-30% 2017, ESRD on HD MWF, HTN presenting today with abdominal distention.  He reports that for the past 2 to 3 months he has been noticing increased abdominal girth but denies abdominal pain, nausea, vomiting, shortness of breath, lower extremity swelling, fevers or chills.  He had a similar incident about a year ago when he was told that he had multiple hernias.  On physical exam, abdomen was noticeable for distention, liver edge was palpable about 3 to 4 cm below the costal margin, no splenomegaly, abdomen was nontender to palpation, negative fluid wave, no lower extremity edema, heart sounds were normal, no crackles or wheezes on lung auscultation.  Point-of-care abdominal ultrasound reviewed trace intraperitoneal perihepatic ascites which was present on abdominal ultrasound on 05/2017, hepatomegaly and bilateral renal cysts.  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  Plan: - CT abdomen - Echocardiogram - RTC in 1-2 months or sooner pending imaging - Continue hemodialysis   Inguinal hernia:  [name] has a history of hernia repair.  Hernia status post repair in 2017.  Today he states that he feels his hernia is returned.  On physical exam, there was a noticeable bulge bilaterally at the groin R>L measuring about 1.5 cm.  He most likely has an uncomplicated hernia with no signs of incarceration or  strangulation.  Plan: - Will continue to monitor - Given strict return precautions  Chronic combined systolic and diastolic congestive heart failure: [name] has nonischemic cardiomyopathy, combined systolic and diastolic heart failure EF 20-30% 2017.  He was last seen by cardiology on 07/22/2016 for preoperative evaluation for right inguinal hernia repair.  He was evaluated by electrophysiologist Dr. Virl Axe who indicated that the ICD implant was not optimal for him and will be complicated due to ESRD, substance abuse and nonadherence.  He recommended medical management and continue beta-blocker.  Plan: -Continue Coreg 25 mg twice daily -Continue Imdur 60 mg daily - Referral to cardiology (Dr. Adam Phenix)  Solitary lung nodule: Was noted to have a vague nodular density of the left midlung on chest x-ray in 2016.  In addition, he has been experiencing chronic cough and was evaluated by family practice in 12/2017 and was given a 5-day course of Levaquin 750 mg.  Checks x-ray reviewed right middle lobe consolidation with repeat CXR which also showed persistent consolidation of right middle lobe.    Plan: - CT chest    Past Medical History:  Diagnosis Date  . CHF (congestive heart failure) ([place])   . Cholecystitis 04/18/2014  . Combined congestive systolic and diastolic heart failure ([place]) 04/18/2014   A. Echo 8/13: Severe LVH, EF 40-45%, inferoposterior akinesis, grade 2 diastolic dysfunction, moderate LAE, mild RVE, mildly reduced RVSF, mild RAE; cannot rule out R atrial mass-suggest TEE or cardiac MRI  //  B. Echo 3/17: Mild LVH, EF 25-30%, diffuse HK, grade 2 diastolic dysfunction, mild MR, severe LAE,  moderately reduced RVSF, severe RAE, mild TR, moderate PI, PASP 55 mmHg    . ESRD (end stage renal disease)  on dialysis [facility])    MWF- [place] (05/18/2017)  . Hemodialysis patient ([place])    M,W,F  . Hypertension   . Medical non-compliance   . Non-ischemic cardiomyopathy  ([place])    A. R/L HC 3/17: Normal coronary arteries, moderate pulmonary hypertension (PASP 65 mmHg), elevated LV filling pressures (LVEDP 45 mmHg)   . NSVT (nonsustained ventricular tachycardia) ([place]) 02/22/2016  . NSVT (nonsustained ventricular tachycardia) ([place]) 02/22/2016  . Polysubstance abuse ([place])   . Restless leg syndrome    Review of Systems: As per HPI  Physical Exam:  Vitals:   08/10/18 0842  BP: 120/90  Pulse: 71  Temp: 98.5 F (36.9 C)  TempSrc: Oral  SpO2: 99%  Weight: 168 lb 11.2 oz (76.5 kg)  Height: 5' 9.5" (1.765 m)   Constitutional: In no acute distress HEENT: Noticeable bulge bilaterally at the supraclavicular region, nontender to touch, nonerythematous, no palpable mass or lymph node. Cardiovascular: Normal S1 and S2.  No murmurs, gallops, rubs Respiratory: Clear to auscultation bilaterally.  No wheezes, crackles, rhonchi Abdomen: Distended abdomen, liver edge was palpable about 3 to 4 cm below the costal margin, no splenomegaly, abdomen was nontender to palpation, negative fluid wave, BS+  Assessment & Plan:   See Encounters Tab for problem based charting.  Patient discussed with Dr. Rebeca Alert

## 2018-08-10 NOTE — Assessment & Plan Note (Signed)
[name] [name] has nonischemic cardiomyopathy, combined systolic and diastolic heart failure EF 20-30% 2017, ESRD on HD MWF, HTN presenting today with abdominal distention.  He reports that for the past 2 to 3 months he has been noticing increased abdominal girth but denies abdominal pain, nausea, vomiting, shortness of breath, lower extremity swelling, fevers or chills.  He had a similar incident about a year ago when he was told that he had multiple hernias.  On physical exam, abdomen was noticeable for distention, liver edge was palpable about 3 to 4 cm below the costal margin, no splenomegaly, abdomen was nontender to palpation, negative fluid wave, no lower extremity edema, heart sounds were normal, no crackles or wheezes on lung auscultation.  Point-of-care abdominal ultrasound reviewed trace intraperitoneal perihepatic ascites which was present on abdominal ultrasound on 05/2017, hepatomegaly and bilateral renal cysts.  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  Plan: - CT abdomen - Echocardiogram - RTC in 1-2 months or sooner pending imaging - Continue hemodialysis [facility]

## 2018-08-10 NOTE — Assessment & Plan Note (Addendum)
[name] [name] has a history of hernia repair.  Hernia status post repair in 2017.  Today he states that he feels his hernia is returned.  On physical exam, there was a noticeable bulge bilaterally at the groin R>L measuring about 1.5 cm.  He most likely has an uncomplicated hernia with no signs of incarceration or strangulation.  Plan: - Will continue to monitor - Given strict return precautions

## 2018-08-10 NOTE — Assessment & Plan Note (Signed)
Was noted to have a vague nodular density of the left midlung on chest x-ray in 2016.  In addition, he has been experiencing chronic cough and was evaluated by family practice in 12/2017 and was given a 5-day course of Levaquin 750 mg.  Checks x-ray reviewed right middle lobe consolidation with repeat CXR which also showed persistent consolidation of right middle lobe.    Plan: - CT chest

## 2018-08-10 NOTE — Assessment & Plan Note (Addendum)
Mr. Beeks has nonischemic cardiomyopathy, combined systolic and diastolic heart failure EF 20-30% 2017.  He was last seen by cardiology on 07/22/2016 for preoperative evaluation for right inguinal hernia repair.  He was evaluated by electrophysiologist Dr. Virl Axe who indicated that the ICD implant was not optimal for him and will be complicated due to ESRD, substance abuse and nonadherence.  He recommended medical management and continue beta-blocker.  Plan: -Continue Coreg 25 mg twice daily -Continue Imdur 60 mg daily - Referral to cardiology (Dr. Adam Phenix)

## 2018-08-11 DIAGNOSIS — N186 End stage renal disease: Secondary | ICD-10-CM | POA: Diagnosis not present

## 2018-08-11 DIAGNOSIS — N2581 Secondary hyperparathyroidism of renal origin: Secondary | ICD-10-CM | POA: Diagnosis not present

## 2018-08-13 DIAGNOSIS — N186 End stage renal disease: Secondary | ICD-10-CM | POA: Diagnosis not present

## 2018-08-13 DIAGNOSIS — N2581 Secondary hyperparathyroidism of renal origin: Secondary | ICD-10-CM | POA: Diagnosis not present

## 2018-08-15 DIAGNOSIS — I120 Hypertensive chronic kidney disease with stage 5 chronic kidney disease or end stage renal disease: Secondary | ICD-10-CM | POA: Diagnosis not present

## 2018-08-15 DIAGNOSIS — Z992 Dependence on renal dialysis: Secondary | ICD-10-CM | POA: Diagnosis not present

## 2018-08-15 DIAGNOSIS — N186 End stage renal disease: Secondary | ICD-10-CM | POA: Diagnosis not present

## 2018-08-16 DIAGNOSIS — N2581 Secondary hyperparathyroidism of renal origin: Secondary | ICD-10-CM | POA: Diagnosis not present

## 2018-08-16 DIAGNOSIS — D509 Iron deficiency anemia, unspecified: Secondary | ICD-10-CM | POA: Diagnosis not present

## 2018-08-16 DIAGNOSIS — N186 End stage renal disease: Secondary | ICD-10-CM | POA: Diagnosis not present

## 2018-08-18 ENCOUNTER — Encounter: Payer: Self-pay | Admitting: Internal Medicine

## 2018-08-18 DIAGNOSIS — N186 End stage renal disease: Secondary | ICD-10-CM | POA: Diagnosis not present

## 2018-08-18 DIAGNOSIS — N2581 Secondary hyperparathyroidism of renal origin: Secondary | ICD-10-CM | POA: Diagnosis not present

## 2018-08-18 DIAGNOSIS — D509 Iron deficiency anemia, unspecified: Secondary | ICD-10-CM | POA: Diagnosis not present

## 2018-08-19 NOTE — Progress Notes (Signed)
Internal Medicine Clinic Attending  I saw and evaluated the patient.  I personally confirmed the key portions of the history and exam documented by Dr. Eileen Stanford and I reviewed pertinent patient test results.  The assessment, diagnosis, and plan were formulated together and I agree with the documentation in the resident's note.  Here for worsening abdominal distention.  Exam and point-of-care ultrasound significant for hepatomegaly, which could be due to volume overload from HFrEF and ESRD, but other etiologies of hepatomegaly also possible.  Minimal amount of ascites noted in Morison's pouch between liver and kidney.  This warrants further imaging and laboratory work-up.  We discussed the best possible imaging modality, and both ultrasound and CT are reasonable.  However, given that he has a pulmonary nodule that needs follow-up, we discussed obtaining CT chest abdomen and pelvis with and without contrast to follow-up on the nodule and evaluate his hepatomegaly.  We will try to coordinate with his dialysis center to have him dialyzed after the contrast load.  In addition, his exam was notable for bilateral swelling between his distal clavicles and trapezius muscles that is soft and easily compressible.  Minor pulsations noted on exam, suggesting close approximation to the subclavian or carotid arteries.  On point-of-care ultrasound examination, did not appear hypoechoic as would expect for fluid collection, density was more consistent with tissue.  Uncertain of the etiology for these bilateral masses, but they would also be evaluated on CT chest imaging.  We will follow-up closely on this imaging to see what further studies are warranted.  Lenice Pressman, M.D., Ph.D.

## 2018-08-20 ENCOUNTER — Ambulatory Visit (HOSPITAL_COMMUNITY): Payer: Medicare Other

## 2018-08-20 DIAGNOSIS — N186 End stage renal disease: Secondary | ICD-10-CM | POA: Diagnosis not present

## 2018-08-20 DIAGNOSIS — N2581 Secondary hyperparathyroidism of renal origin: Secondary | ICD-10-CM | POA: Diagnosis not present

## 2018-08-20 DIAGNOSIS — D509 Iron deficiency anemia, unspecified: Secondary | ICD-10-CM | POA: Diagnosis not present

## 2018-08-25 DIAGNOSIS — N186 End stage renal disease: Secondary | ICD-10-CM | POA: Diagnosis not present

## 2018-08-25 DIAGNOSIS — N2581 Secondary hyperparathyroidism of renal origin: Secondary | ICD-10-CM | POA: Diagnosis not present

## 2018-08-25 DIAGNOSIS — D509 Iron deficiency anemia, unspecified: Secondary | ICD-10-CM | POA: Diagnosis not present

## 2018-08-26 ENCOUNTER — Ambulatory Visit (HOSPITAL_COMMUNITY): Admission: RE | Admit: 2018-08-26 | Payer: Medicare Other | Source: Ambulatory Visit

## 2018-08-27 ENCOUNTER — Other Ambulatory Visit: Payer: Self-pay | Admitting: Internal Medicine

## 2018-08-27 DIAGNOSIS — N2581 Secondary hyperparathyroidism of renal origin: Secondary | ICD-10-CM | POA: Diagnosis not present

## 2018-08-27 DIAGNOSIS — Z720 Tobacco use: Secondary | ICD-10-CM

## 2018-08-27 DIAGNOSIS — R160 Hepatomegaly, not elsewhere classified: Secondary | ICD-10-CM

## 2018-08-27 DIAGNOSIS — D509 Iron deficiency anemia, unspecified: Secondary | ICD-10-CM | POA: Diagnosis not present

## 2018-08-27 DIAGNOSIS — R140 Abdominal distension (gaseous): Secondary | ICD-10-CM

## 2018-08-27 DIAGNOSIS — R911 Solitary pulmonary nodule: Secondary | ICD-10-CM

## 2018-08-27 DIAGNOSIS — N186 End stage renal disease: Secondary | ICD-10-CM | POA: Diagnosis not present

## 2018-08-30 DIAGNOSIS — N186 End stage renal disease: Secondary | ICD-10-CM | POA: Diagnosis not present

## 2018-08-30 DIAGNOSIS — D509 Iron deficiency anemia, unspecified: Secondary | ICD-10-CM | POA: Diagnosis not present

## 2018-08-30 DIAGNOSIS — N2581 Secondary hyperparathyroidism of renal origin: Secondary | ICD-10-CM | POA: Diagnosis not present

## 2018-09-01 DIAGNOSIS — N2581 Secondary hyperparathyroidism of renal origin: Secondary | ICD-10-CM | POA: Diagnosis not present

## 2018-09-01 DIAGNOSIS — N186 End stage renal disease: Secondary | ICD-10-CM | POA: Diagnosis not present

## 2018-09-01 DIAGNOSIS — D509 Iron deficiency anemia, unspecified: Secondary | ICD-10-CM | POA: Diagnosis not present

## 2018-09-03 DIAGNOSIS — N186 End stage renal disease: Secondary | ICD-10-CM | POA: Diagnosis not present

## 2018-09-03 DIAGNOSIS — N2581 Secondary hyperparathyroidism of renal origin: Secondary | ICD-10-CM | POA: Diagnosis not present

## 2018-09-03 DIAGNOSIS — D509 Iron deficiency anemia, unspecified: Secondary | ICD-10-CM | POA: Diagnosis not present

## 2018-09-06 DIAGNOSIS — I771 Stricture of artery: Secondary | ICD-10-CM | POA: Diagnosis not present

## 2018-09-06 DIAGNOSIS — T82858A Stenosis of vascular prosthetic devices, implants and grafts, initial encounter: Secondary | ICD-10-CM | POA: Diagnosis not present

## 2018-09-06 DIAGNOSIS — Z992 Dependence on renal dialysis: Secondary | ICD-10-CM | POA: Diagnosis not present

## 2018-09-06 DIAGNOSIS — N186 End stage renal disease: Secondary | ICD-10-CM | POA: Diagnosis not present

## 2018-09-07 ENCOUNTER — Ambulatory Visit (HOSPITAL_COMMUNITY): Payer: Medicare Other

## 2018-09-07 DIAGNOSIS — N2581 Secondary hyperparathyroidism of renal origin: Secondary | ICD-10-CM | POA: Diagnosis not present

## 2018-09-07 DIAGNOSIS — N186 End stage renal disease: Secondary | ICD-10-CM | POA: Diagnosis not present

## 2018-09-07 DIAGNOSIS — D509 Iron deficiency anemia, unspecified: Secondary | ICD-10-CM | POA: Diagnosis not present

## 2018-09-08 DIAGNOSIS — D509 Iron deficiency anemia, unspecified: Secondary | ICD-10-CM | POA: Diagnosis not present

## 2018-09-08 DIAGNOSIS — N2581 Secondary hyperparathyroidism of renal origin: Secondary | ICD-10-CM | POA: Diagnosis not present

## 2018-09-08 DIAGNOSIS — N186 End stage renal disease: Secondary | ICD-10-CM | POA: Diagnosis not present

## 2018-09-10 DIAGNOSIS — N2581 Secondary hyperparathyroidism of renal origin: Secondary | ICD-10-CM | POA: Diagnosis not present

## 2018-09-10 DIAGNOSIS — N186 End stage renal disease: Secondary | ICD-10-CM | POA: Diagnosis not present

## 2018-09-10 DIAGNOSIS — D509 Iron deficiency anemia, unspecified: Secondary | ICD-10-CM | POA: Diagnosis not present

## 2018-09-13 DIAGNOSIS — D509 Iron deficiency anemia, unspecified: Secondary | ICD-10-CM | POA: Diagnosis not present

## 2018-09-13 DIAGNOSIS — N2581 Secondary hyperparathyroidism of renal origin: Secondary | ICD-10-CM | POA: Diagnosis not present

## 2018-09-13 DIAGNOSIS — N186 End stage renal disease: Secondary | ICD-10-CM | POA: Diagnosis not present

## 2018-09-14 DIAGNOSIS — Z992 Dependence on renal dialysis: Secondary | ICD-10-CM | POA: Diagnosis not present

## 2018-09-14 DIAGNOSIS — I120 Hypertensive chronic kidney disease with stage 5 chronic kidney disease or end stage renal disease: Secondary | ICD-10-CM | POA: Diagnosis not present

## 2018-09-14 DIAGNOSIS — N186 End stage renal disease: Secondary | ICD-10-CM | POA: Diagnosis not present

## 2018-09-15 DIAGNOSIS — D509 Iron deficiency anemia, unspecified: Secondary | ICD-10-CM | POA: Diagnosis not present

## 2018-09-15 DIAGNOSIS — N2581 Secondary hyperparathyroidism of renal origin: Secondary | ICD-10-CM | POA: Diagnosis not present

## 2018-09-15 DIAGNOSIS — Z23 Encounter for immunization: Secondary | ICD-10-CM | POA: Diagnosis not present

## 2018-09-15 DIAGNOSIS — N186 End stage renal disease: Secondary | ICD-10-CM | POA: Diagnosis not present

## 2018-09-16 NOTE — Addendum Note (Signed)
Addended by: Hulan Fray on: 09/16/2018 04:54 PM   Modules accepted: Orders

## 2018-09-17 ENCOUNTER — Other Ambulatory Visit: Payer: Self-pay

## 2018-09-17 ENCOUNTER — Encounter: Payer: Self-pay | Admitting: Internal Medicine

## 2018-09-17 ENCOUNTER — Ambulatory Visit (INDEPENDENT_AMBULATORY_CARE_PROVIDER_SITE_OTHER): Payer: Medicare Other | Admitting: Internal Medicine

## 2018-09-17 VITALS — BP 110/77 | HR 59 | Temp 99.0°F | Ht 69.5 in | Wt 169.3 lb

## 2018-09-17 DIAGNOSIS — Z992 Dependence on renal dialysis: Secondary | ICD-10-CM

## 2018-09-17 DIAGNOSIS — Z79899 Other long term (current) drug therapy: Secondary | ICD-10-CM

## 2018-09-17 DIAGNOSIS — R918 Other nonspecific abnormal finding of lung field: Secondary | ICD-10-CM

## 2018-09-17 DIAGNOSIS — I132 Hypertensive heart and chronic kidney disease with heart failure and with stage 5 chronic kidney disease, or end stage renal disease: Secondary | ICD-10-CM | POA: Diagnosis not present

## 2018-09-17 DIAGNOSIS — N186 End stage renal disease: Secondary | ICD-10-CM | POA: Diagnosis not present

## 2018-09-17 DIAGNOSIS — F17210 Nicotine dependence, cigarettes, uncomplicated: Secondary | ICD-10-CM | POA: Diagnosis not present

## 2018-09-17 DIAGNOSIS — I5042 Chronic combined systolic (congestive) and diastolic (congestive) heart failure: Secondary | ICD-10-CM

## 2018-09-17 DIAGNOSIS — Z23 Encounter for immunization: Secondary | ICD-10-CM | POA: Diagnosis not present

## 2018-09-17 DIAGNOSIS — F1011 Alcohol abuse, in remission: Secondary | ICD-10-CM | POA: Diagnosis not present

## 2018-09-17 DIAGNOSIS — N2581 Secondary hyperparathyroidism of renal origin: Secondary | ICD-10-CM | POA: Diagnosis not present

## 2018-09-17 DIAGNOSIS — D649 Anemia, unspecified: Secondary | ICD-10-CM | POA: Diagnosis not present

## 2018-09-17 DIAGNOSIS — I5040 Unspecified combined systolic (congestive) and diastolic (congestive) heart failure: Secondary | ICD-10-CM | POA: Diagnosis not present

## 2018-09-17 DIAGNOSIS — R140 Abdominal distension (gaseous): Secondary | ICD-10-CM | POA: Diagnosis not present

## 2018-09-17 DIAGNOSIS — R911 Solitary pulmonary nodule: Secondary | ICD-10-CM

## 2018-09-17 DIAGNOSIS — Z9114 Patient's other noncompliance with medication regimen: Secondary | ICD-10-CM | POA: Diagnosis not present

## 2018-09-17 DIAGNOSIS — I43 Cardiomyopathy in diseases classified elsewhere: Secondary | ICD-10-CM

## 2018-09-17 DIAGNOSIS — D509 Iron deficiency anemia, unspecified: Secondary | ICD-10-CM | POA: Diagnosis not present

## 2018-09-17 MED ORDER — CLONAZEPAM 0.5 MG PO TABS: 0.5000 mg | ORAL_TABLET | ORAL | 3 refills | Status: DC | PRN

## 2018-09-17 NOTE — Progress Notes (Signed)
   CC: Medication refill, Routine follow up   HPI:  [name] is a 52 y.o. 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  Please see problem based charting for further details.  Past Medical History:  Diagnosis Date  . CHF (congestive heart failure) ([place])   . Cholecystitis 04/18/2014  . Combined congestive systolic and diastolic heart failure ([place]) 04/18/2014   A. Echo 8/13: Severe LVH, EF 40-45%, inferoposterior akinesis, grade 2 diastolic dysfunction, moderate LAE, mild RVE, mildly reduced RVSF, mild RAE; cannot rule out R atrial mass-suggest TEE or cardiac MRI  //  B. Echo 3/17: Mild LVH, EF 25-30%, diffuse HK, grade 2 diastolic dysfunction, mild MR, severe LAE,  moderately reduced RVSF, severe RAE, mild TR, moderate PI, PASP 55 mmHg    . ESRD (end stage renal disease) on dialysis [facility])    MWF- [place] (05/18/2017)  . Hemodialysis patient ([place])    M,W,F  . Hypertension   . Medical non-compliance   . Non-ischemic cardiomyopathy ([place])    A. R/L HC 3/17: Normal coronary arteries, moderate pulmonary hypertension (PASP 65 mmHg), elevated LV filling pressures (LVEDP 45 mmHg)   . NSVT (nonsustained ventricular tachycardia) ([place]) 02/22/2016  . NSVT (nonsustained ventricular tachycardia) ([place]) 02/22/2016  . Polysubstance abuse ([place])   . Restless leg syndrome    Review of Systems:  As per HPI  Physical Exam:  Vitals:   09/17/18 1332  BP: 110/77  Pulse: (!) 59  Temp: 99 F (37.2 C)  TempSrc: Oral  SpO2: 99%  Weight: 169 lb 4.8 oz (76.8 kg)  Height: 5' 9.5" (1.765 m)   Const: In NAD CV: RRR, no MGR Resp: Minimal rales on the posterior lobes Abd: Abdominal distension-unchanged from previous assessment, liver edge palpation, palpable spleen Ext: No pitting edema Skin: No  noticeable spider angiomata  Assessment & Plan:   See Encounters Tab for problem based charting.  Patient discussed with Dr. Dareen Piano

## 2018-09-17 NOTE — Patient Instructions (Signed)
Mr. Grosso,   It was a pleasure seeing you here at the clinic again and taking care of you. As we discussed, I am ordering another image study for you since you were unable to have the previous ones done.   I am also getting labs to see how your liver is doing.   ~Take Care

## 2018-09-17 NOTE — Assessment & Plan Note (Signed)
During our assessment on 8/27, he was noted to have hepatomegaly and splenomegaly on physical exam and also on point-of-care ultrasound.  This is most likely multifactorial secondary to heart failure vs chronic alcohol use.  An order for CT abdomen and pelvis was placed but patient did not follow-up with appointment as he reports that he was feeling nauseous, weak and generalized malaise after undergoing dialysis.  This was after [place] and myself had worked closely with the radiologist and the dialysis center to coordinate the ideal time to undergo his CT images.  On chart review he had a negative hepatitis panel in 2018 and he denies any history of hepatitis B or C.  Plan: - His previous CT abdomen with contrast will be consult -A new order for complete abdominal ultrasound has been placed - Follow-up LFTs

## 2018-09-17 NOTE — Assessment & Plan Note (Addendum)
Previous chest x-ray revealed right middle lobe consolidation with repeat CXR which also showed persistent consolidation of right middle lobe.  When I saw the patient on 8/27 I placed an order for CT chest but he reports that he was unable to follow through with appointment as he was feeling weak and nauseous after dialysis.  He has minimal rales on lung exams which has been present and I am suspecting COPD but will hold off on PFTs for now and reevaluate.  Plan: -CT chest without contrast placed

## 2018-09-17 NOTE — Assessment & Plan Note (Signed)
[name] [name] has nonischemic cardiomyopathy, combined systolic and diastolic heart failure EF 25-30% 2017.  He was last seen at the clinic on 08/10/2018 and was referred for an echocardiogram but he no showed.  He remains asymptomatic and denies shortness of breath, orthopnea, dyspnea on exertion, paroxysmal nocturnal dyspnea, lower extremity edema.  He reports that he is able to walk 2 blocks without feeling short of breath and also able to carry out his daily activities.  I strongly encouraged the patient to follow through with the second set of echocardiogram that I am placing.  Previous evaluation by cardiology noted that he was not the ideal candidate for an ICD placement due to complication of ESRD, substance abuse and nonadherence to medications.  Plan: - A new order for echocardiogram has been placed -Continue Coreg 25 mg twice daily and Imdur 60 mg daily -He is to follow-up with Dr. Adam Phenix

## 2018-09-18 LAB — HEPATIC FUNCTION PANEL
ALT: 6 IU/L (ref 0–44)
AST: 10 IU/L (ref 0–40)
Albumin: 4.4 g/dL (ref 3.5–5.5)
Alkaline Phosphatase: 105 IU/L (ref 39–117)
Bilirubin Total: 0.8 mg/dL (ref 0.0–1.2)
Bilirubin, Direct: 0.39 mg/dL (ref 0.00–0.40)
Total Protein: 7.6 g/dL (ref 6.0–8.5)

## 2018-09-20 DIAGNOSIS — N2581 Secondary hyperparathyroidism of renal origin: Secondary | ICD-10-CM | POA: Diagnosis not present

## 2018-09-20 DIAGNOSIS — D509 Iron deficiency anemia, unspecified: Secondary | ICD-10-CM | POA: Diagnosis not present

## 2018-09-20 DIAGNOSIS — N186 End stage renal disease: Secondary | ICD-10-CM | POA: Diagnosis not present

## 2018-09-20 DIAGNOSIS — Z23 Encounter for immunization: Secondary | ICD-10-CM | POA: Diagnosis not present

## 2018-09-20 NOTE — Progress Notes (Signed)
Internal Medicine Clinic Attending  I saw and evaluated the patient.  I personally confirmed the key portions of the history and exam documented by Dr. Eileen Stanford and I reviewed pertinent patient test results.  The assessment, diagnosis, and plan were formulated together and I agree with the documentation in the resident's note.  Of note, his CT abdomen was cancelled and abd sono was ordered instead.

## 2018-09-21 ENCOUNTER — Other Ambulatory Visit (HOSPITAL_COMMUNITY): Payer: Medicare Other

## 2018-09-22 DIAGNOSIS — N2581 Secondary hyperparathyroidism of renal origin: Secondary | ICD-10-CM | POA: Diagnosis not present

## 2018-09-22 DIAGNOSIS — Z23 Encounter for immunization: Secondary | ICD-10-CM | POA: Diagnosis not present

## 2018-09-22 DIAGNOSIS — N186 End stage renal disease: Secondary | ICD-10-CM | POA: Diagnosis not present

## 2018-09-22 DIAGNOSIS — D509 Iron deficiency anemia, unspecified: Secondary | ICD-10-CM | POA: Diagnosis not present

## 2018-09-24 DIAGNOSIS — D509 Iron deficiency anemia, unspecified: Secondary | ICD-10-CM | POA: Diagnosis not present

## 2018-09-24 DIAGNOSIS — N186 End stage renal disease: Secondary | ICD-10-CM | POA: Diagnosis not present

## 2018-09-24 DIAGNOSIS — N2581 Secondary hyperparathyroidism of renal origin: Secondary | ICD-10-CM | POA: Diagnosis not present

## 2018-09-24 DIAGNOSIS — Z23 Encounter for immunization: Secondary | ICD-10-CM | POA: Diagnosis not present

## 2018-09-29 DIAGNOSIS — D509 Iron deficiency anemia, unspecified: Secondary | ICD-10-CM | POA: Diagnosis not present

## 2018-09-29 DIAGNOSIS — N186 End stage renal disease: Secondary | ICD-10-CM | POA: Diagnosis not present

## 2018-09-29 DIAGNOSIS — Z23 Encounter for immunization: Secondary | ICD-10-CM | POA: Diagnosis not present

## 2018-09-29 DIAGNOSIS — N2581 Secondary hyperparathyroidism of renal origin: Secondary | ICD-10-CM | POA: Diagnosis not present

## 2018-09-30 ENCOUNTER — Ambulatory Visit (HOSPITAL_COMMUNITY): Admission: RE | Admit: 2018-09-30 | Payer: Medicare Other | Source: Ambulatory Visit

## 2018-10-01 ENCOUNTER — Encounter: Payer: Self-pay | Admitting: Internal Medicine

## 2018-10-01 DIAGNOSIS — D509 Iron deficiency anemia, unspecified: Secondary | ICD-10-CM | POA: Diagnosis not present

## 2018-10-01 DIAGNOSIS — N186 End stage renal disease: Secondary | ICD-10-CM | POA: Diagnosis not present

## 2018-10-01 DIAGNOSIS — N2581 Secondary hyperparathyroidism of renal origin: Secondary | ICD-10-CM | POA: Diagnosis not present

## 2018-10-01 DIAGNOSIS — Z23 Encounter for immunization: Secondary | ICD-10-CM | POA: Diagnosis not present

## 2018-10-04 DIAGNOSIS — N2581 Secondary hyperparathyroidism of renal origin: Secondary | ICD-10-CM | POA: Diagnosis not present

## 2018-10-04 DIAGNOSIS — Z23 Encounter for immunization: Secondary | ICD-10-CM | POA: Diagnosis not present

## 2018-10-04 DIAGNOSIS — N186 End stage renal disease: Secondary | ICD-10-CM | POA: Diagnosis not present

## 2018-10-04 DIAGNOSIS — D509 Iron deficiency anemia, unspecified: Secondary | ICD-10-CM | POA: Diagnosis not present

## 2018-10-06 DIAGNOSIS — Z23 Encounter for immunization: Secondary | ICD-10-CM | POA: Diagnosis not present

## 2018-10-06 DIAGNOSIS — N186 End stage renal disease: Secondary | ICD-10-CM | POA: Diagnosis not present

## 2018-10-06 DIAGNOSIS — N2581 Secondary hyperparathyroidism of renal origin: Secondary | ICD-10-CM | POA: Diagnosis not present

## 2018-10-06 DIAGNOSIS — D509 Iron deficiency anemia, unspecified: Secondary | ICD-10-CM | POA: Diagnosis not present

## 2018-10-07 ENCOUNTER — Ambulatory Visit (HOSPITAL_COMMUNITY): Payer: Medicare Other | Attending: Internal Medicine

## 2018-10-11 DIAGNOSIS — D509 Iron deficiency anemia, unspecified: Secondary | ICD-10-CM | POA: Diagnosis not present

## 2018-10-11 DIAGNOSIS — Z23 Encounter for immunization: Secondary | ICD-10-CM | POA: Diagnosis not present

## 2018-10-11 DIAGNOSIS — N2581 Secondary hyperparathyroidism of renal origin: Secondary | ICD-10-CM | POA: Diagnosis not present

## 2018-10-11 DIAGNOSIS — N186 End stage renal disease: Secondary | ICD-10-CM | POA: Diagnosis not present

## 2018-10-15 DIAGNOSIS — N186 End stage renal disease: Secondary | ICD-10-CM | POA: Diagnosis not present

## 2018-10-15 DIAGNOSIS — D509 Iron deficiency anemia, unspecified: Secondary | ICD-10-CM | POA: Diagnosis not present

## 2018-10-15 DIAGNOSIS — N2581 Secondary hyperparathyroidism of renal origin: Secondary | ICD-10-CM | POA: Diagnosis not present

## 2018-10-15 DIAGNOSIS — Z23 Encounter for immunization: Secondary | ICD-10-CM | POA: Diagnosis not present

## 2018-10-18 DIAGNOSIS — N186 End stage renal disease: Secondary | ICD-10-CM | POA: Diagnosis not present

## 2018-10-18 DIAGNOSIS — D509 Iron deficiency anemia, unspecified: Secondary | ICD-10-CM | POA: Diagnosis not present

## 2018-10-18 DIAGNOSIS — N2581 Secondary hyperparathyroidism of renal origin: Secondary | ICD-10-CM | POA: Diagnosis not present

## 2018-10-18 DIAGNOSIS — Z23 Encounter for immunization: Secondary | ICD-10-CM | POA: Diagnosis not present

## 2018-10-20 DIAGNOSIS — Z23 Encounter for immunization: Secondary | ICD-10-CM | POA: Diagnosis not present

## 2018-10-20 DIAGNOSIS — N186 End stage renal disease: Secondary | ICD-10-CM | POA: Diagnosis not present

## 2018-10-20 DIAGNOSIS — D509 Iron deficiency anemia, unspecified: Secondary | ICD-10-CM | POA: Diagnosis not present

## 2018-10-20 DIAGNOSIS — N2581 Secondary hyperparathyroidism of renal origin: Secondary | ICD-10-CM | POA: Diagnosis not present

## 2018-10-22 ENCOUNTER — Other Ambulatory Visit: Payer: Self-pay

## 2018-10-22 ENCOUNTER — Encounter: Payer: Self-pay | Admitting: *Deleted

## 2018-10-22 ENCOUNTER — Encounter: Payer: Self-pay | Admitting: Family

## 2018-10-22 ENCOUNTER — Other Ambulatory Visit: Payer: Self-pay | Admitting: *Deleted

## 2018-10-22 ENCOUNTER — Ambulatory Visit (INDEPENDENT_AMBULATORY_CARE_PROVIDER_SITE_OTHER): Payer: Medicare Other | Admitting: Family

## 2018-10-22 VITALS — BP 129/90 | HR 57 | Temp 97.0°F | Resp 16 | Ht 69.0 in | Wt 180.0 lb

## 2018-10-22 DIAGNOSIS — N186 End stage renal disease: Secondary | ICD-10-CM

## 2018-10-22 DIAGNOSIS — T82898D Other specified complication of vascular prosthetic devices, implants and grafts, subsequent encounter: Secondary | ICD-10-CM

## 2018-10-22 DIAGNOSIS — Z992 Dependence on renal dialysis: Secondary | ICD-10-CM | POA: Diagnosis not present

## 2018-10-22 NOTE — Progress Notes (Signed)
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 (1966-11-19) male who is s/p plication of left upper arm AV fistula by Dr. Bridgett Larsson on 01/06/17.   He denies any pain in his hand.  He dialyzes M/W/F on [employer].  Pt was last evaluated on 01-23-17 by S. Rhyne PA-C. At that time incision was healing nicely with staples in tact.  Strong left hand grip with 2+ left radial pulse.  There was an excellent thrill within the fistula.  Pt was doing well and incision was well healed, staples removed. He had a small superficial area on the aneurysmal area distal to the incision.  He was to f/u with Dr. Bridgett Larsson in 2 weeks on 02/06/17 at 11am to evaluate this area.  This should heal with period of rest. [name] instructed the pt not to allow sticks over the incision or the area of concern until he sees Dr. Bridgett Larsson back in 2 weeks.  May stick the distal portion of the incision. Pt was to will call sooner if he has any issues.    Pt did not follow up after the visit on 01-23-17, until today. Today he returns at the request of Dr. Joelyn Oms to evaluate thinning of skin over aneurysms on his left upper arm AVF, in use for about 4+ years. He denies any steal sx's left UE.  He denies any bleeding from his AV fistula.   Past Medical History:  Diagnosis Date  . Abdominal distension 08/10/2018  . Alcohol abuse 11/26/2010   Qualifier: Diagnosis of  By: [name] MD, Benjamine Mola    . Anemia due to blood loss, acute 07/15/2012  . CHF (congestive heart failure) ([place])   . Cholecystitis 04/18/2014  . Cigarette smoker 11/26/2010   Qualifier: Diagnosis of  By: [name] MD, Benjamine Mola    . Cocaine abuse ([place]) 11/26/2010   Qualifier: Diagnosis of  By: [name] MD, Benjamine Mola    . Combined congestive systolic and diastolic heart failure ([place]) 04/18/2014   A. Echo 8/13: Severe LVH, EF 40-45%, inferoposterior  akinesis, grade 2 diastolic dysfunction, moderate LAE, mild RVE, mildly reduced RVSF, mild RAE; cannot rule out R atrial mass-suggest TEE or cardiac MRI  //  B. Echo 3/17: Mild LVH, EF 25-30%, diffuse HK, grade 2 diastolic dysfunction, mild MR, severe LAE,  moderately reduced RVSF, severe RAE, mild TR, moderate PI, PASP 55 mmHg    . ESRD (end stage renal disease) on dialysis [facility])    MWF- [place] (05/18/2017)  . Hemodialysis patient ([place])    M,W,F  . Hernia, inguinal, right 08/05/2016  . Hypertension   . 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 Hypocalcemia 07/15/2012  . Medical non-compliance   . Non-ischemic cardiomyopathy ([place])    A. R/L HC 3/17: Normal coronary arteries, moderate pulmonary hypertension (PASP 65 mmHg), elevated LV filling pressures (LVEDP 45 mmHg)   . NSVT (nonsustained ventricular tachycardia) ([place]) 02/22/2016  . NSVT (nonsustained ventricular tachycardia) ([place]) 02/22/2016  . Pneumonia 11/12/2017  . Polysubstance abuse ([place])   . Prolonged Q-T interval on ECG 05/18/2017  . Prolonged QT interval 05/18/2017  . Restless leg syndrome   . Solitary pulmonary nodule 10/10/2015   See cxr 10/09/2015 - CT rec 10/10/2015 >>>   .  Upper airway cough syndrome 10/09/2015   Off ACEi around 1st Oct 2016  - Sinus CT 10/10/2015 >>>      Social History Social History   Tobacco Use  . Smoking status: Current Every Day Smoker    Packs/day: 0.10    Years: 30.00    Pack years: 3.00    Types: Cigarettes  . Smokeless tobacco: Never Used  . Tobacco comment: Cutting back   Substance Use Topics  . Alcohol use: Yes    Alcohol/week: 6.0 standard drinks    Types: 6 Cans of beer per week  . Drug use: No    Types: Marijuana    Comment: 05/18/2017 "qd"    Family History Family History  Problem Relation Age of Onset  . Hypertension Mother   . Diabetes Mother   .  Hypertension Father     Surgical History Past Surgical History:  Procedure Laterality Date  . [place] TRANSPOSITION  01/19/2013   Procedure: BASCILIC VEIN TRANSPOSITION;  Surgeon: Conrad Borup, MD;  Location: [facility];  Service: Vascular;  Laterality: Left;  left 2nd stage basilic vein transposition  . CARDIAC CATHETERIZATION N/A 02/25/2016   Procedure: Right/Left Heart Cath and Coronary Angiography;  Surgeon: Burnell Blanks, MD;  Location: [facility];  Service: Cardiovascular;  Laterality: N/A;  . INGUINAL HERNIA REPAIR Right 08/05/2016   Procedure: RIGHT INGUINAL HERNIA REPAIR WITH MESH;  Surgeon: Donnie Mesa, MD;  Location: [facility];  Service: General;  Laterality: Right;  . INSERTION OF DIALYSIS CATHETER  07/17/2012   Procedure: INSERTION OF DIALYSIS CATHETER;  Surgeon: Conrad Valley Hi, MD;  Location: [facility];  Service: Vascular;  Laterality: Right;  right internal jugular  . INSERTION OF MESH Right 08/05/2016   Procedure: INSERTION OF MESH;  Surgeon: Donnie Mesa, MD;  Location: [facility];  Service: General;  Laterality: Right;  . REVISON OF ARTERIOVENOUS FISTULA Left 01/06/2017   Procedure: REVISON OF ARTERIOVENOUS FISTULA;  Surgeon: Conrad Nuremberg, MD;  Location: [facility];  Service: Vascular;  Laterality: Left;  . UMBILICAL HERNIA REPAIR N/A 08/05/2016   Procedure: [place] Walk;  Surgeon: Donnie Mesa, MD;  Location: [facility];  Service: General;  Laterality: N/A;  . VENOGRAM N/A 08/09/2012   Procedure: VENOGRAM;  Surgeon: Conrad Greenfield, MD;  Location: [facility] CATH LAB;  Service: Cardiovascular;  Laterality: N/A;    Allergies  Allergen Reactions  . Losartan Cough    Current Outpatient Medications  Medication Sig Dispense Refill  . calcium acetate (PHOSLO) 667 MG capsule Take 3 capsules (2,001 mg total) by mouth 3 (three) times daily with meals. (Patient taking differently: Take 1,334-3,335 mg by mouth See admin instructions. Take 3-5 capsules (2001 - 3335 mg) by mouth 3  times daily with large meals, take 2 capsules (1334 mg) with snacks) 90 capsule 0  . carvedilol (COREG) 25 MG tablet Take 1 tablet (25 mg total) by mouth 2 (two) times daily. 60 tablet 0  . clonazePAM (KLONOPIN) 0.5 MG tablet Take 1 tablet (0.5 mg total) by mouth as needed. 30 tablet 3  . isosorbide mononitrate (IMDUR) 60 MG 24 hr tablet Take 1 tablet (60 mg total) by mouth daily. 30 tablet 0  . albuterol (PROVENTIL HFA;VENTOLIN HFA) 108 (90 Base) MCG/ACT inhaler Inhale 2 puffs into the lungs every 6 (six) hours as needed for wheezing or shortness of breath. (Patient not taking: Reported on 10/22/2018) 1 Inhaler 0  . Multiple Vitamin (MULTIVITAMIN WITH MINERALS) TABS tablet Take  1 tablet by mouth daily.     No current facility-administered medications for this visit.      REVIEW OF SYSTEMS: see HPI for pertinent positives and negatives    PHYSICAL EXAMINATION:  Vitals:   10/22/18 1457  BP: 129/90  Pulse: (!) 57  Resp: 16  Temp: (!) 97 F (36.1 C)  TempSrc: Oral  SpO2: 98%  Weight: 180 lb (81.6 kg)  Height: 5\' 9"  (1.753 m)   Body mass index is 26.58 kg/m.  General: The patient appears his stated age.   HEENT:  No gross abnormalities Pulmonary: Respirations are non-labored Abdomen: Soft and non-tender Musculoskeletal: There are no major deformities.   Neurologic: No focal weakness or paresthesias are detected, Skin: There are no ulcer or rashes noted. Psychiatric: The patient has normal affect. Cardiovascular: There is a regular rate and rhythm without significant murmur appreciated.  Left radial pulse is 2+ palpable. Palpable thrill over aneurysmal AVF.    Left upper arm AV fistula     Medical Decision Making  Ladarryl Wrage is a 52 y.o. male who was referred by Dr. 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  Dr. Donzetta Matters spoke with and examined pt. Will schedule for plication  and revision of left upper arm AV fistula for 10-26-18, available vascular surgeon that date.  Pt advised to hold direct pressure to bleeding area, if bleeding occurs from AV fistula.    Clemon Chambers, RN, MSN, FNP-C [facility] Office: [phone]  10/22/2018, 3:07 PM  Clinic MD: Donzetta Matters

## 2018-10-25 ENCOUNTER — Other Ambulatory Visit: Payer: Self-pay

## 2018-10-25 ENCOUNTER — Ambulatory Visit (HOSPITAL_COMMUNITY): Payer: Medicare Other | Admitting: Vascular Surgery

## 2018-10-25 ENCOUNTER — Encounter (HOSPITAL_COMMUNITY): Payer: Self-pay | Admitting: *Deleted

## 2018-10-25 NOTE — Progress Notes (Signed)
Spoke with pt's mother, Zephan Beauchaine for pre-op call. DPR on file. She states pt has hx of CHF, not sure about other cardiac history. Pt's cardiologist is Dr. Ashok Norris. Mrs. Tugwell states pt has not c/o any recent chest pain or sob. She states pt is not diabetic. She also states she's not sure if he is using any recreational drugs or if he is still smoking. Pre-op instructions given to her and she voiced understanding.

## 2018-10-25 NOTE — Anesthesia Preprocedure Evaluation (Addendum)
Anesthesia Evaluation    Airway        Dental   Pulmonary Current Smoker,           Cardiovascular hypertension, Pt. on medications and Pt. on home beta blockers +CHF    CV: Cardiac cath 02/25/16: Conclusions: 1. No angiographic evidence of CAD 2. Moderate pulmonary HTN 3. Elevated LV filling pressures.  Recommendations: Would consider aggressive volume removal with HD.  Echo 02/21/16: Study Conclusions - Left ventricle: The cavity size was normal. Wall thickness was increased in a pattern of mild LVH. There was mild concentric hypertrophy. Systolic function was severely reduced. The estimated ejection fraction was in the range of 25% to 30%. Diffuse hypokinesis. Features are consistent with a pseudonormal left ventricular filling pattern, with concomitant abnormalrelaxation and increased filling pressure (grade 2diastolicdysfunction). Doppler parameters are consistent with high ventricular filling pressure. - Aortic valve: Transvalvular velocity was within the normal range. There was no stenosis. There was no regurgitation. - Mitral valve: There was mild regurgitation. - Left atrium: The atrium was severely dilated. - Right ventricle: The cavity size was mildly dilated. Wall thickness was normal. Systolic function was moderately reduced. - Right atrium: The atrium was severely dilated. - Atrial septum: No defect or patent foramen ovale was identified by color flow Doppler. - Tricuspid valve: There was mild regurgitation. - Pulmonic valve: There was moderate regurgitation. - Pulmonary arteries: Systolic pressure was severely increased. PA peak pressure: 55 mm Hg (S). - Inferior vena cava: The vessel was dilated. The respirophasic diameter changes were blunted (<50%), consistent with elevated central venous pressure. (Comparsion EF 40-45% 07/16/12.)   Neuro/Psych negative neurological ROS     GI/Hepatic negative GI ROS, (+)      substance abuse  alcohol use, cocaine use and marijuana use,   Endo/Other  negative endocrine ROS  Renal/GU ESRF and DialysisRenal disease     Musculoskeletal   Abdominal   Peds  Hematology  (+) anemia ,   Anesthesia Other Findings   Reproductive/Obstetrics                            Lab Results  Component Value Date   WBC 3.9 12/31/2017   HGB 12.3 (L) 12/31/2017   HCT 37.4 (L) 12/31/2017   MCV 99 (H) 12/31/2017   PLT 176 12/31/2017   Lab Results  Component Value Date   CREATININE 7.79 (H) 11/14/2017   BUN 30 (H) 11/14/2017   NA 138 11/14/2017   K 3.7 11/14/2017   CL 97 (L) 11/14/2017   CO2 28 11/14/2017    Anesthesia Physical Anesthesia Plan  ASA: III  Anesthesia Plan:    Post-op Pain Management:    Induction:   PONV Risk Score and Plan:   Airway Management Planned:   Additional Equipment:   Intra-op Plan:   Post-operative Plan:   Informed Consent:   Plan Discussed with:   Anesthesia Plan Comments: (PAT note written 10/25/2018 by Myra Gianotti, PA-C.  )       Anesthesia Quick Evaluation

## 2018-10-25 NOTE — Progress Notes (Signed)
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  History includes smoking, ESRD (HD MWF, [place]), non-ischemic cardiomyopathy (normal coronaries 02/2016), combined systolic and diastolic CHF (EF 97-98% 08/2118, PASP 55 mmHg; last admission for acute CHF 05/2017), NSVT (in the setting of cocaine and hypertensive urgency '11, 02/2016), prolonged QT 05/18/17 (+ cocaine), HTN, anemia, polysubstance abuse (alcohol, tobacco, marijuana; previous cocaine, + UDS 05/19/17), medical non-compliance. He has pending abdominal U/S to evaluate exam findings of hepatomegaly and splenomegaly (LFTs WNL 09/17/18, PLT count WNL 12/31/17), a routine echo to follow-up CHF (denied acute symptoms), and chest CT to follow-up lung nodule per PCP Jean Rosenthal, MD 09/17/18 note.   Previous evaluation by EP cardiology noted that he was not the ideal candidate for an ICD placement due to complication of ESRD, substance abuse and nonadherence to medications. This was also noted in 05/2017 cardiology consult notes.   Patient with extensive history as outlined above (including ESRD and non-ischemic CM with EF 25-30%, not felt to be a candidate for ICD in 2018). He has pending tests ordered by his PCP last month, but his AVF is also at risk for bleeding, so unless he is having acute symptoms, acute drug intoxication, or his labs are unacceptable for OR then I would still anticipate that he could proceed as planned. However, he is a same day work-up, so definitve  plan following evaluation by his anesthesia team on the day of surgery.    PROVIDERS: Jean Rosenthal, MD is PCP ([facility]). Last visit 09/17/18. Patient did not have his ordered CT abd/pelvis due to hepatomegaly/splenomegaly on exam and POC U/S, so abdominal ultrasound ordered with follow-up LFTs. CT scan of the chest ordered for follow-up pulmonary nodule. Routine echo ordered to follow-up on CHF history although at 09/17/18 visit, "He remains asymptomatic and denies shortness of breath, orthopnea, dyspnea on exertion, paroxysmal nocturnal dyspnea, lower extremity edema.  He reports that he is able to walk 2 blocks without feeling short of breath and also able to carry out his daily activities."   Nephrologist is through [place], MD is primary cardiologist. However, cardiology encounters noted during 05/2017 hospitalization for acute on chronic systolic and diastolic HF in the setting of not taking his medications for several days. Drug screen positive for cocaine & metabolites, opiates, THC. 2017 echo showed LVEF 25-30%. Compliance with medical therapy recommended. He was not a good candidate for ICD due to noncompliance and drug abuse.    LABS: He is for ISTAT4 on the day of surgery. Last labs in Epic include: Lab Results  Component Value Date   WBC 3.9 12/31/2017   HGB 12.3 (L) 12/31/2017   HCT 37.4 (L) 12/31/2017   PLT 176 12/31/2017   GLUCOSE 68 11/14/2017   ALT 6 09/17/2018   AST 10 09/17/2018   NA 138 11/14/2017   K 3.7 11/14/2017   CL  97 (L) 11/14/2017   CREATININE 7.79 (H) 11/14/2017   BUN 30 (H) 11/14/2017   CO2 28 11/14/2017    IMAGES: CXR 12/31/17: IMPRESSION: Persistent consolidation of the right medial lung base since November 12, 2017. Further evaluation with a chest CT is recommended for exclusion of underlying mass. (Follow-up chest CT ordered by Jean Rosenthal, MD, but patient had not had yet. He also did  not go to his scheduled CT abd/pelvis or abdominal U/S that were also ordered.)   EKG: 11/12/17: SR with first degree AV block, occasional PVCs. Right superior axis deviation. Anterior infarct (age undetermined).    CV: Cardiac cath 02/25/16:  Conclusions: 1. No angiographic evidence of CAD 2. Moderate pulmonary HTN 3. Elevated LV filling pressures.  Recommendations: Would consider aggressive volume removal with HD.   Echo 02/21/16:  Study Conclusions - Left ventricle: The cavity size was normal. Wall thickness was increased in a pattern of mild LVH. There was mild concentric hypertrophy. Systolic function was severely reduced. The estimated ejection fraction was in the range of 25% to 30%. Diffuse hypokinesis. Features are consistent with a pseudonormal left ventricular filling pattern, with concomitant abnormalrelaxation and increased filling pressure (grade 2 diastolic dysfunction). Doppler parameters are consistent with high ventricular filling pressure. - Aortic valve: Transvalvular velocity was within the normal range. There was no stenosis. There was no regurgitation. - Mitral valve: There was mild regurgitation. - Left atrium: The atrium was severely dilated. - Right ventricle: The cavity size was mildly dilated. Wall thickness was normal. Systolic function was moderately reduced. - Right atrium: The atrium was severely dilated. - Atrial septum: No defect or patent foramen ovale was identified by color flow Doppler. - Tricuspid valve: There was mild regurgitation. - Pulmonic valve: There was moderate regurgitation. - Pulmonary arteries: Systolic pressure was severely increased. PA peak pressure: 55 mm Hg (S). - Inferior vena cava: The vessel was dilated. The respirophasic diameter changes were blunted (<50%), consistent with elevated central venous pressure. (Comparsion EF 40-45% 07/16/12.)    Past Medical History:  Diagnosis Date  . Abdominal distension 08/10/2018  . Alcohol  abuse 11/26/2010   Qualifier: Diagnosis of  By: [name] MD, Benjamine Mola    . Anemia due to blood loss, acute 07/15/2012  . CHF (congestive heart failure) ([place])   . Cholecystitis 04/18/2014  . Cigarette smoker 11/26/2010   Qualifier: Diagnosis of  By: [name] MD, Benjamine Mola    . Cocaine abuse ([place]) 11/26/2010   Qualifier: Diagnosis of  By: [name] MD, Benjamine Mola    . Combined congestive systolic and diastolic heart failure ([place]) 04/18/2014   A. Echo 8/13: Severe LVH, EF 40-45%, inferoposterior akinesis, grade 2 diastolic dysfunction, moderate LAE, mild RVE, mildly reduced RVSF, mild RAE; cannot rule out R atrial mass-suggest TEE or cardiac MRI  //  B. Echo 3/17: Mild LVH, EF 25-30%, diffuse HK, grade 2 diastolic dysfunction, mild MR, severe LAE,  moderately reduced RVSF, severe RAE, mild TR, moderate PI, PASP 55 mmHg    . ESRD (end stage renal disease) on dialysis [facility])    MWF- [place] (05/18/2017)  . Hemodialysis patient ([place])    M,W,F  . Hernia, inguinal, right 08/05/2016  . Hypertension   . 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 Hypocalcemia 07/15/2012  . Medical non-compliance   . Non-ischemic cardiomyopathy ([place])    A. R/L HC 3/17: Normal coronary  arteries, moderate pulmonary hypertension (PASP 65 mmHg), elevated LV filling pressures (LVEDP 45 mmHg)   . NSVT (nonsustained ventricular tachycardia) ([place]) 02/22/2016  . NSVT (nonsustained ventricular tachycardia) ([place]) 02/22/2016  . Pneumonia 11/12/2017  . Polysubstance abuse ([place])   . Prolonged Q-T interval on ECG 05/18/2017  . Prolonged QT interval 05/18/2017  . Restless leg syndrome   . Solitary pulmonary nodule 10/10/2015   See cxr 10/09/2015 - CT rec 10/10/2015 >>>   . Upper airway cough syndrome 10/09/2015   Off ACEi around 1st Oct 2016  - Sinus CT 10/10/2015 >>>      Past Surgical History:  Procedure Laterality  Date  . [place] TRANSPOSITION  01/19/2013   Procedure: BASCILIC VEIN TRANSPOSITION;  Surgeon: Conrad [place], MD;  Location: [facility];  Service: Vascular;  Laterality: Left;  left 2nd stage basilic vein transposition  . CARDIAC CATHETERIZATION N/A 02/25/2016   Procedure: Right/Left Heart Cath and Coronary Angiography;  Surgeon: Burnell Blanks, MD;  Location: [facility];  Service: Cardiovascular;  Laterality: N/A;  . INGUINAL HERNIA REPAIR Right 08/05/2016   Procedure: RIGHT INGUINAL HERNIA REPAIR WITH MESH;  Surgeon: Donnie Mesa, MD;  Location: [facility];  Service: General;  Laterality: Right;  . INSERTION OF DIALYSIS CATHETER  07/17/2012   Procedure: INSERTION OF DIALYSIS CATHETER;  Surgeon: Conrad [place], MD;  Location: [facility] [place];  Service: Vascular;  Laterality: Right;  right internal jugular  . INSERTION OF MESH Right 08/05/2016   Procedure: INSERTION OF MESH;  Surgeon: Donnie Mesa, MD;  Location: [facility];  Service: General;  Laterality: Right;  . REVISON OF ARTERIOVENOUS FISTULA Left 01/06/2017   Procedure: REVISON OF ARTERIOVENOUS FISTULA;  Surgeon: Conrad [place], MD;  Location: [facility];  Service: Vascular;  Laterality: Left;  . UMBILICAL HERNIA REPAIR N/A 08/05/2016   Procedure: [place];  Surgeon: Donnie Mesa, MD;  Location: [facility];  Service: General;  Laterality: N/A;  . VENOGRAM N/A 08/09/2012   Procedure: VENOGRAM;  Surgeon: Conrad Parke, MD;  Location: [facility] CATH LAB;  Service: Cardiovascular;  Laterality: N/A;    MEDICATIONS: No current facility-administered medications for this encounter.    . calcium acetate (PHOSLO) 667 MG capsule  . carvedilol (COREG) 25 MG tablet  . clonazePAM (KLONOPIN) 0.5 MG tablet  . isosorbide mononitrate (IMDUR) 60 MG 24 hr tablet    [facility] Short Stay Center/Anesthesiology Phone [phone] 10/25/2018 11:27 AM

## 2018-10-26 ENCOUNTER — Observation Stay (HOSPITAL_COMMUNITY)
Admission: EM | Admit: 2018-10-26 | Discharge: 2018-10-28 | Disposition: A | Discharge status: Home / Self Care | Payer: Medicare Other | Attending: Student in an Organized Health Care Education/Training Program | Admitting: Student in an Organized Health Care Education/Training Program

## 2018-10-26 ENCOUNTER — Emergency Department (HOSPITAL_COMMUNITY): Payer: Medicare Other

## 2018-10-26 ENCOUNTER — Encounter (HOSPITAL_COMMUNITY)
Admission: RE | Disposition: A | Discharge status: Still In Hospital | Payer: Self-pay | Source: Ambulatory Visit | Attending: Vascular Surgery

## 2018-10-26 ENCOUNTER — Encounter (HOSPITAL_COMMUNITY): Payer: Self-pay

## 2018-10-26 ENCOUNTER — Encounter (HOSPITAL_COMMUNITY): Payer: Self-pay | Admitting: Certified Registered"

## 2018-10-26 ENCOUNTER — Other Ambulatory Visit: Payer: Self-pay

## 2018-10-26 ENCOUNTER — Ambulatory Visit (HOSPITAL_COMMUNITY)
Admission: RE | Admit: 2018-10-26 | Discharge: 2018-10-26 | Disposition: A | Discharge status: Still In Hospital | Payer: Medicare Other | Source: Ambulatory Visit | Attending: Vascular Surgery | Admitting: Vascular Surgery

## 2018-10-26 DIAGNOSIS — Y832 Surgical operation with anastomosis, bypass or graft as the cause of abnormal reaction of the patient, or of later complication, without mention of misadventure at the time of the procedure: Secondary | ICD-10-CM

## 2018-10-26 DIAGNOSIS — Z9115 Patient's noncompliance with renal dialysis: Secondary | ICD-10-CM | POA: Insufficient documentation

## 2018-10-26 DIAGNOSIS — Z79899 Other long term (current) drug therapy: Secondary | ICD-10-CM | POA: Diagnosis not present

## 2018-10-26 DIAGNOSIS — Z5309 Procedure and treatment not carried out because of other contraindication: Secondary | ICD-10-CM | POA: Insufficient documentation

## 2018-10-26 DIAGNOSIS — F1010 Alcohol abuse, uncomplicated: Secondary | ICD-10-CM | POA: Diagnosis not present

## 2018-10-26 DIAGNOSIS — Z992 Dependence on renal dialysis: Secondary | ICD-10-CM | POA: Insufficient documentation

## 2018-10-26 DIAGNOSIS — Z8249 Family history of ischemic heart disease and other diseases of the circulatory system: Secondary | ICD-10-CM | POA: Diagnosis not present

## 2018-10-26 DIAGNOSIS — I132 Hypertensive heart and chronic kidney disease with heart failure and with stage 5 chronic kidney disease, or end stage renal disease: Secondary | ICD-10-CM

## 2018-10-26 DIAGNOSIS — I428 Other cardiomyopathies: Secondary | ICD-10-CM | POA: Diagnosis not present

## 2018-10-26 DIAGNOSIS — I2510 Atherosclerotic heart disease of native coronary artery without angina pectoris: Secondary | ICD-10-CM | POA: Insufficient documentation

## 2018-10-26 DIAGNOSIS — I160 Hypertensive urgency: Secondary | ICD-10-CM | POA: Insufficient documentation

## 2018-10-26 DIAGNOSIS — R0600 Dyspnea, unspecified: Secondary | ICD-10-CM | POA: Diagnosis not present

## 2018-10-26 DIAGNOSIS — I5043 Acute on chronic combined systolic (congestive) and diastolic (congestive) heart failure: Secondary | ICD-10-CM | POA: Insufficient documentation

## 2018-10-26 DIAGNOSIS — X58XXXA Exposure to other specified factors, initial encounter: Secondary | ICD-10-CM | POA: Insufficient documentation

## 2018-10-26 DIAGNOSIS — Z833 Family history of diabetes mellitus: Secondary | ICD-10-CM | POA: Diagnosis not present

## 2018-10-26 DIAGNOSIS — I272 Pulmonary hypertension, unspecified: Secondary | ICD-10-CM | POA: Diagnosis not present

## 2018-10-26 DIAGNOSIS — Z9119 Patient's noncompliance with other medical treatment and regimen: Secondary | ICD-10-CM | POA: Diagnosis not present

## 2018-10-26 DIAGNOSIS — D631 Anemia in chronic kidney disease: Secondary | ICD-10-CM | POA: Diagnosis not present

## 2018-10-26 DIAGNOSIS — G2581 Restless legs syndrome: Secondary | ICD-10-CM | POA: Insufficient documentation

## 2018-10-26 DIAGNOSIS — N186 End stage renal disease: Secondary | ICD-10-CM | POA: Insufficient documentation

## 2018-10-26 DIAGNOSIS — E1122 Type 2 diabetes mellitus with diabetic chronic kidney disease: Secondary | ICD-10-CM

## 2018-10-26 DIAGNOSIS — F1410 Cocaine abuse, uncomplicated: Secondary | ICD-10-CM | POA: Insufficient documentation

## 2018-10-26 DIAGNOSIS — I5040 Unspecified combined systolic (congestive) and diastolic (congestive) heart failure: Secondary | ICD-10-CM | POA: Insufficient documentation

## 2018-10-26 DIAGNOSIS — T82898A Other specified complication of vascular prosthetic devices, implants and grafts, initial encounter: Secondary | ICD-10-CM | POA: Insufficient documentation

## 2018-10-26 DIAGNOSIS — R0602 Shortness of breath: Secondary | ICD-10-CM | POA: Diagnosis not present

## 2018-10-26 DIAGNOSIS — I5042 Chronic combined systolic (congestive) and diastolic (congestive) heart failure: Secondary | ICD-10-CM | POA: Diagnosis present

## 2018-10-26 DIAGNOSIS — Z841 Family history of disorders of kidney and ureter: Secondary | ICD-10-CM | POA: Diagnosis not present

## 2018-10-26 DIAGNOSIS — F17210 Nicotine dependence, cigarettes, uncomplicated: Secondary | ICD-10-CM | POA: Insufficient documentation

## 2018-10-26 DIAGNOSIS — I120 Hypertensive chronic kidney disease with stage 5 chronic kidney disease or end stage renal disease: Secondary | ICD-10-CM | POA: Diagnosis not present

## 2018-10-26 DIAGNOSIS — E8770 Fluid overload, unspecified: Secondary | ICD-10-CM | POA: Diagnosis not present

## 2018-10-26 HISTORY — DX: Dyspnea, unspecified: R06.00

## 2018-10-26 LAB — CBC WITH DIFFERENTIAL/PLATELET
Abs Immature Granulocytes: 0.01 10*3/uL (ref 0.00–0.07)
BASOS ABS: 0.1 10*3/uL (ref 0.0–0.1)
Basophils Relative: 1 %
EOS ABS: 0.2 10*3/uL (ref 0.0–0.5)
EOS PCT: 4 %
HCT: 37.3 % — ABNORMAL LOW (ref 39.0–52.0)
Hemoglobin: 11.6 g/dL — ABNORMAL LOW (ref 13.0–17.0)
Immature Granulocytes: 0 %
LYMPHS ABS: 1.2 10*3/uL (ref 0.7–4.0)
Lymphocytes Relative: 25 %
MCH: 30.4 pg (ref 26.0–34.0)
MCHC: 31.1 g/dL (ref 30.0–36.0)
MCV: 97.9 fL (ref 80.0–100.0)
MONO ABS: 0.7 10*3/uL (ref 0.1–1.0)
Monocytes Relative: 14 %
NRBC: 0 % (ref 0.0–0.2)
Neutro Abs: 2.8 10*3/uL (ref 1.7–7.7)
Neutrophils Relative %: 56 %
Platelets: 187 10*3/uL (ref 150–400)
RBC: 3.81 MIL/uL — ABNORMAL LOW (ref 4.22–5.81)
RDW: 15.7 % — AB (ref 11.5–15.5)
WBC: 4.9 10*3/uL (ref 4.0–10.5)

## 2018-10-26 LAB — I-STAT CHEM 8, ED
BUN: 101 mg/dL — AB (ref 6–20)
CREATININE: 16 mg/dL — AB (ref 0.61–1.24)
Calcium, Ion: 0.89 mmol/L — CL (ref 1.15–1.40)
Chloride: 97 mmol/L — ABNORMAL LOW (ref 98–111)
GLUCOSE: 94 mg/dL (ref 70–99)
HCT: 40 % (ref 39.0–52.0)
Hemoglobin: 13.6 g/dL (ref 13.0–17.0)
POTASSIUM: 5 mmol/L (ref 3.5–5.1)
Sodium: 136 mmol/L (ref 135–145)
TCO2: 26 mmol/L (ref 22–32)

## 2018-10-26 LAB — MRSA PCR SCREENING: MRSA BY PCR: NEGATIVE

## 2018-10-26 LAB — POCT I-STAT 4, (NA,K, GLUC, HGB,HCT)
Glucose, Bld: 102 mg/dL — ABNORMAL HIGH (ref 70–99)
HCT: 40 % (ref 39.0–52.0)
Hemoglobin: 13.6 g/dL (ref 13.0–17.0)
POTASSIUM: 4.8 mmol/L (ref 3.5–5.1)
SODIUM: 137 mmol/L (ref 135–145)

## 2018-10-26 SURGERY — REVISON OF ARTERIOVENOUS FISTULA
Anesthesia: General | Laterality: Left

## 2018-10-26 MED ORDER — ISOSORBIDE MONONITRATE ER 60 MG PO TB24
60.0000 mg | ORAL_TABLET | Freq: Every day | ORAL | Status: DC
  Administered 2018-10-26 – 2018-10-28 (×3): 60 mg via ORAL
  Filled 2018-10-26 (×3): qty 1

## 2018-10-26 MED ORDER — LIDOCAINE 2% (20 MG/ML) 5 ML SYRINGE
INTRAMUSCULAR | Status: AC
  Filled 2018-10-26: qty 5

## 2018-10-26 MED ORDER — ACETAMINOPHEN 325 MG PO TABS: 650.0000 mg | ORAL_TABLET | Freq: Four times a day (QID) | ORAL | Status: DC | PRN

## 2018-10-26 MED ORDER — SENNOSIDES-DOCUSATE SODIUM 8.6-50 MG PO TABS: 1.0000 | ORAL_TABLET | Freq: Every evening | ORAL | Status: DC | PRN

## 2018-10-26 MED ORDER — HEPARIN SODIUM (PORCINE) 5000 UNIT/ML IJ SOLN: 5000.0000 [IU] | Freq: Three times a day (TID) | INTRAMUSCULAR | Status: DC

## 2018-10-26 MED ORDER — PROPOFOL 10 MG/ML IV BOLUS
INTRAVENOUS | Status: AC
  Filled 2018-10-26: qty 20

## 2018-10-26 MED ORDER — HEPARIN SODIUM (PORCINE) 1000 UNIT/ML DIALYSIS
5000.0000 [IU] | Freq: Once | INTRAMUSCULAR | Status: DC
  Filled 2018-10-26: qty 5

## 2018-10-26 MED ORDER — CARVEDILOL 25 MG PO TABS
25.0000 mg | ORAL_TABLET | Freq: Two times a day (BID) | ORAL | Status: DC
  Administered 2018-10-26 – 2018-10-28 (×5): 25 mg via ORAL
  Filled 2018-10-26 (×5): qty 1

## 2018-10-26 MED ORDER — CEFAZOLIN SODIUM-DEXTROSE 2-4 GM/100ML-% IV SOLN: 2.0000 g | INTRAVENOUS | Status: DC

## 2018-10-26 MED ORDER — GLYCOPYRROLATE PF 0.2 MG/ML IJ SOSY
PREFILLED_SYRINGE | INTRAMUSCULAR | Status: AC
  Filled 2018-10-26: qty 1

## 2018-10-26 MED ORDER — MIDAZOLAM HCL 2 MG/2ML IJ SOLN
INTRAMUSCULAR | Status: AC
  Filled 2018-10-26: qty 2

## 2018-10-26 MED ORDER — CHLORHEXIDINE GLUCONATE CLOTH 2 % EX PADS
6.0000 | MEDICATED_PAD | Freq: Every day | CUTANEOUS | Status: DC
  Administered 2018-10-27: 6 via TOPICAL

## 2018-10-26 MED ORDER — HEPARIN SODIUM (PORCINE) 1000 UNIT/ML IJ SOLN
INTRAMUSCULAR | Status: AC
  Administered 2018-10-26: 5000 [IU]
  Filled 2018-10-26: qty 5

## 2018-10-26 MED ORDER — ACETAMINOPHEN 650 MG RE SUPP: 650.0000 mg | Freq: Four times a day (QID) | RECTAL | Status: DC | PRN

## 2018-10-26 MED ORDER — DEXAMETHASONE SODIUM PHOSPHATE 10 MG/ML IJ SOLN
INTRAMUSCULAR | Status: AC
  Filled 2018-10-26: qty 1

## 2018-10-26 MED ORDER — FENTANYL CITRATE (PF) 250 MCG/5ML IJ SOLN
INTRAMUSCULAR | Status: AC
  Filled 2018-10-26: qty 5

## 2018-10-26 MED ORDER — PHENYLEPHRINE 40 MCG/ML (10ML) SYRINGE FOR IV PUSH (FOR BLOOD PRESSURE SUPPORT)
PREFILLED_SYRINGE | INTRAVENOUS | Status: AC
  Filled 2018-10-26: qty 10

## 2018-10-26 MED ORDER — ONDANSETRON HCL 4 MG/2ML IJ SOLN
INTRAMUSCULAR | Status: AC
  Filled 2018-10-26: qty 2

## 2018-10-26 MED ORDER — SODIUM CHLORIDE 0.9 % IV SOLN
1.5000 g | INTRAVENOUS | Status: AC
  Administered 2018-10-27: 1.5 g via INTRAVENOUS
  Filled 2018-10-26 (×2): qty 1.5

## 2018-10-26 MED ORDER — SODIUM CHLORIDE 0.9 % IV SOLN
INTRAVENOUS | Status: DC
  Administered 2018-10-26: 07:00:00 via INTRAVENOUS

## 2018-10-26 NOTE — Progress Notes (Signed)
Report given to nurse in ED by Pecolia Ades, RN.  Patient transferred to ED.  ED nurses at bedside with patient.

## 2018-10-26 NOTE — H&P (Signed)
Date: 10/26/2018               Patient Name:  [name] [name] MRN: [id number]  DOB: [birth year] Age / Sex: 52 y.o., male   PCP: Jean Rosenthal, MD         Medical Service: Internal Medicine Teaching Service         Attending Physician: Dr. Evette Doffing, Mallie Mussel, *    First Contact: Dr. Laural Golden Pager: [phone]  Second Contact: Dr. Frederico Hamman Pager: [phone]       After Hours (After 5p/  First Contact Pager: [phone]  weekends / holidays): Second Contact Pager: [phone]   Chief Complaint: Shortness of breath   History of Present Illness: [name] is a 52 year old male with a past medical history of ESRD on HD M/W/F, hypertension, nonischemic CAD and combined systolic and diastolic HF that presented to short stay today for elective fistula revision.  He reports missing dialysis for the past week due to being out of town on vacation. He reports shortness of breath and inability to lay flat.  From short stay he was sent to the ED for further evaluation and need for hemodialysis.  Nephrology was consulted in the ED for hemodialysis.  Patient denies fever/chills, chest pains, abdominal pains or leg swelling.  Meds:  Current Meds  Medication Sig  . calcium acetate (PHOSLO) 667 MG capsule Take 3 capsules (2,001 mg total) by mouth 3 (three) times daily with meals. (Patient taking differently: Take 1,334-3,335 mg by mouth See admin instructions. Take 3-5 capsules (2001 - 3335 mg) by mouth 3 times daily with large meals, take 2 capsules (1334 mg) with snacks)  . carvedilol (COREG) 25 MG tablet Take 1 tablet (25 mg total) by mouth 2 (two) times daily.  . clonazePAM (KLONOPIN) 0.5 MG tablet Take 1 tablet (0.5 mg total) by mouth as needed. (Patient taking differently: Take 0.5 mg by mouth every Monday, Wednesday, and Friday with hemodialysis. )  . isosorbide mononitrate (IMDUR) 60 MG 24 hr tablet Take 1 tablet (60 mg total) by mouth daily.     Allergies: Allergies as of 10/26/2018 - Review  Complete 10/26/2018  Allergen Reaction Noted  . Losartan Cough 08/01/2016   Past Medical History:  Diagnosis Date  . Abdominal distension 08/10/2018  . Alcohol abuse 11/26/2010   Qualifier: Diagnosis of  By: [name] MD, Benjamine Mola    . Anemia due to blood loss, acute 07/15/2012  . CHF (congestive heart failure) ([place])   . Cholecystitis 04/18/2014  . Cigarette smoker 11/26/2010   Qualifier: Diagnosis of  By: [name] MD, Benjamine Mola    . Cocaine abuse ([place]) 11/26/2010   Qualifier: Diagnosis of  By: [name] MD, Benjamine Mola    . Combined congestive systolic and diastolic heart failure ([place]) 04/18/2014   A. Echo 8/13: Severe LVH, EF 40-45%, inferoposterior akinesis, grade 2 diastolic dysfunction, moderate LAE, mild RVE, mildly reduced RVSF, mild RAE; cannot rule out R atrial mass-suggest TEE or cardiac MRI  //  B. Echo 3/17: Mild LVH, EF 25-30%, diffuse HK, grade 2 diastolic dysfunction, mild MR, severe LAE,  moderately reduced RVSF, severe RAE, mild TR, moderate PI, PASP 55 mmHg    . Dyspnea   . ESRD (end stage renal disease) on dialysis [facility])    MWF- [place] (05/18/2017)  . Hemodialysis patient ([place])    M,W,F  . Hernia, inguinal, right 08/05/2016  . Hypertension   . 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 Hypocalcemia 07/15/2012  . Medical non-compliance   . Non-ischemic cardiomyopathy ([place])    A. R/L HC 3/17: Normal coronary arteries, moderate pulmonary hypertension (PASP 65 mmHg), elevated LV filling pressures (LVEDP 45 mmHg)   . NSVT (nonsustained ventricular tachycardia) ([place]) 02/22/2016  . NSVT (nonsustained ventricular tachycardia) ([place]) 02/22/2016  . Pneumonia 11/12/2017  . Polysubstance abuse ([place])   . Prolonged Q-T interval on ECG 05/18/2017  . Prolonged QT interval 05/18/2017  . Restless leg syndrome   . Solitary pulmonary nodule 10/10/2015   See cxr 10/09/2015 - CT  rec 10/10/2015 >>>   . Upper airway cough syndrome 10/09/2015   Off ACEi around 1st Oct 2016  - Sinus CT 10/10/2015 >>>      Family History:  Family History  Problem Relation Age of Onset  . Hypertension Mother   . Diabetes Mother   . Renal Disease Mother   . Hypertension Father      Social History: Tobacco use: denies Alcohol use: Denies Illicit drug use: Denies  Review of Systems: A complete ROS was negative except as per HPI.   Physical Exam: Blood pressure 131/88, pulse (!) 57, temperature 97.8 F (36.6 C), temperature source Oral, resp. rate 18, height 5\' 9"  (1.753 m), weight 81.6 kg, SpO2 99 %. Physical Exam  Constitutional: He is oriented to person, place, and time and well-developed, well-nourished, and in no distress.  HENT:  Head: Normocephalic and atraumatic.  Eyes: Pupils are equal, round, and reactive to light. Conjunctivae and EOM are normal.  Neck: Normal range of motion.  Cardiovascular: Normal rate, regular rhythm and normal heart sounds. Exam reveals no gallop and no friction rub.  No murmur heard. Pulmonary/Chest: Effort normal and breath sounds normal. No respiratory distress. He has no wheezes. He has no rales.  Abdominal: Soft. He exhibits no distension and no mass. There is no tenderness. There is no rebound and no guarding.  Musculoskeletal: He exhibits no edema.  Neurological: He is alert and oriented to person, place, and time.  Skin: Skin is warm and dry.     EKG: personally reviewed my interpretation is normal sinus rhythm with ventricular bigeminy and prolonged PR interval  CXR: personally reviewed my interpretation is vascular congestion and perihilar opacities  Assessment & Plan by Problem: Active Problems:   ESRD needing dialysis ([place])  Volume overload secondary to missing hemodialysis for 1 week Patient presented with shortness of breath after missing hemodialysis for 1 week.  No signs of emergent dialysis on blood work. Patient was  transported from the ED to hemodialysis and currently receiving treatment.  He is able to lay flat at this time and has no acute issues.  Patient was planned to have revision of his fistula today. This has been rescheduled for tomorrow. -Hemodialysis -fistula revision per Dr. Donzetta Matters 11/13, NPO at midnight  Combined diastolic and systolic heart failure Continuing home medications - Carvedilol and IMDUR   Dispo: Admit patient to Observation with expected length of stay less than 2 midnights.  Signed: Valinda Party, DO 10/26/2018, 11:45 AM  Pager: [phone]

## 2018-10-26 NOTE — ED Notes (Signed)
IM paged to [phone]-per Dr. Delo-pg by Levada Dy

## 2018-10-26 NOTE — Consult Note (Signed)
Renal Service Consult Note [facility]  [name] [name] 10/26/2018 [name] Requesting Physician:  Dr Stark Jock, ED [facility]  Reason for Consult:  ESRD pt missed HD, SOB HPI: The patient is a 52 y.o. year-old with hx of HTN, esrd on HD, combined CHF, substance abuse presented to ED w/ SOB sent from Short Stay where he was supposed to have surgery on his fistula today.  Patient had gone to the beach and missed last 2 HD sessions.  We were asked to see for HD.    Patient started HD in Aug 2013. Prior to HD was here mostly for HTN urgency and combined CHF/ fluid overload.  He went to the beach this past week w/ his friends to go fishing , he didn't want to bother them to take him to dialysis so he tried to "tough it out".    Pt was seen in SS by Vasc surg this am and due to his difficulty breathing was sent to ED.    Pt denies any fevers, chills, CP or abd pain.      EChart  06/2009 - new dx CKD IV, HTN urgency  02/2011 - CKD IV, HTN urgency, low EF 30%, polysubstance abuse  07/2012 - a/c renal , etoh / cocaine/ tobacco, epistaxis, anemia, accel htn  02/2014 - esrd on HD, high K+, ace dc'd  04/2014 - abd pain/ gallstones, esrd on HD  02/2016 - combined CHF/ pulm edema, had R heart cath w/ LVEDP of 45 w/ normal CXR and clear lungs on exam.  BP meds reduced and had extra HD to lower volume.   07/2016 - repair R ing hernia  05/2017 - etoh/ cocaine, esrd, ^trop, combined CHF/ NICM, RUQ pain, poor ICD candidate per cards due to noncompliance and drug abuse  10/2017 - CAP rx'd w/ abx, esrd on HD   ROS  denies CP  no joint pain   no HA  no blurry vision  no rash  no diarrhea  no nausea/ vomiting    Past Medical History  Past Medical History:  Diagnosis Date  . Abdominal distension 08/10/2018  . Alcohol abuse 11/26/2010   Qualifier: Diagnosis of  By: [name] MD, Benjamine Mola    . Anemia due to blood loss, acute 07/15/2012  . CHF (congestive heart failure) ([place])   . Cholecystitis  04/18/2014  . Cigarette smoker 11/26/2010   Qualifier: Diagnosis of  By: [name] MD, Benjamine Mola    . Cocaine abuse ([place]) 11/26/2010   Qualifier: Diagnosis of  By: [name] MD, Benjamine Mola    . Combined congestive systolic and diastolic heart failure ([place]) 04/18/2014   A. Echo 8/13: Severe LVH, EF 40-45%, inferoposterior akinesis, grade 2 diastolic dysfunction, moderate LAE, mild RVE, mildly reduced RVSF, mild RAE; cannot rule out R atrial mass-suggest TEE or cardiac MRI  //  B. Echo 3/17: Mild LVH, EF 25-30%, diffuse HK, grade 2 diastolic dysfunction, mild MR, severe LAE,  moderately reduced RVSF, severe RAE, mild TR, moderate PI, PASP 55 mmHg    . Dyspnea   . ESRD (end stage renal disease) on dialysis [facility])    MWF- [place] (05/18/2017)  . Hemodialysis patient ([facility])    M,W,F  . Hernia, inguinal, right 08/05/2016  . Hypertension   . 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 Hypocalcemia 07/15/2012  . Medical non-compliance   . Non-ischemic cardiomyopathy ([place])  A. R/L [facility] 3/17: Normal coronary arteries, moderate pulmonary hypertension (PASP 65 mmHg), elevated LV filling pressures (LVEDP 45 mmHg)   . NSVT (nonsustained ventricular tachycardia) ([place]) 02/22/2016  . NSVT (nonsustained ventricular tachycardia) ([place]) 02/22/2016  . Pneumonia 11/12/2017  . Polysubstance abuse ([place])   . Prolonged Q-T interval on ECG 05/18/2017  . Prolonged QT interval 05/18/2017  . Restless leg syndrome   . Solitary pulmonary nodule 10/10/2015   See cxr 10/09/2015 - CT rec 10/10/2015 >>>   . Upper airway cough syndrome 10/09/2015   Off ACEi around 1st Oct 2016  - Sinus CT 10/10/2015 >>>     Past Surgical History  Past Surgical History:  Procedure Laterality Date  . [place] TRANSPOSITION  01/19/2013   Procedure: BASCILIC VEIN TRANSPOSITION;  Surgeon: Conrad Tyrone, MD;  Location: [place];  Service:  Vascular;  Laterality: Left;  left 2nd stage basilic vein transposition  . CARDIAC CATHETERIZATION N/A 02/25/2016   Procedure: Right/Left Heart Cath and Coronary Angiography;  Surgeon: Burnell Blanks, MD;  Location: [place] CV LAB;  Service: Cardiovascular;  Laterality: N/A;  . INGUINAL HERNIA REPAIR Right 08/05/2016   Procedure: RIGHT INGUINAL HERNIA REPAIR WITH MESH;  Surgeon: Donnie Mesa, MD;  Location: [place];  Service: General;  Laterality: Right;  . INSERTION OF DIALYSIS CATHETER  07/17/2012   Procedure: INSERTION OF DIALYSIS CATHETER;  Surgeon: Conrad Winside, MD;  Location: [place];  Service: Vascular;  Laterality: Right;  right internal jugular  . INSERTION OF MESH Right 08/05/2016   Procedure: INSERTION OF MESH;  Surgeon: Donnie Mesa, MD;  Location: [place];  Service: General;  Laterality: Right;  . REVISON OF ARTERIOVENOUS FISTULA Left 01/06/2017   Procedure: REVISON OF ARTERIOVENOUS FISTULA;  Surgeon: Conrad [place], MD;  Location: [place];  Service: Vascular;  Laterality: Left;  . UMBILICAL HERNIA REPAIR N/A 08/05/2016   Procedure: [place];  Surgeon: Donnie Mesa, MD;  Location: [place];  Service: General;  Laterality: N/A;  . VENOGRAM N/A 08/09/2012   Procedure: VENOGRAM;  Surgeon: Conrad [place], MD;  Location[facility] C[facility] LAB;  Service: Cardiovascular;  Laterality: N/A;   Family History  Family History  Problem Relation Age of Onset  . Hypertension Mother   . Diabetes Mother   . Renal Disease Mother   . Hypertension Father    Social History  reports that he has been smoking cigarettes. He has a 3.00 pack-year smoking history. He has never used smokeless tobacco. He reports that he drinks about 6.0 standard drinks of alcohol per week. He reports that he does not use drugs. Allergies  Allergies  Allergen Reactions  . Losartan Cough   Home medications Prior to Admission medications   Medication Sig Start Date End Date Taking? Authorizing Provider  calcium  acetate (PHOSLO) 667 MG capsule Take 3 capsules (2,001 mg total) by mouth 3 (three) times daily with meals. Patient taking differently: Take 1,334-3,335 mg by mouth See admin instructions. Take 3-5 capsules (2001 - 3335 mg) by mouth 3 times daily with large meals, take 2 capsules (1334 mg) with snacks 04/21/14  Yes Mikhail, Maryann, DO  carvedilol (COREG) 25 MG tablet Take 1 tablet (25 mg total) by mouth 2 (two) times daily. 05/20/17  Yes Vann, Jessica U, DO  clonazePAM (KLONOPIN) 0.5 MG tablet Take 1 tablet (0.5 mg total) by mouth as needed. Patient taking differently: Take 0.5 mg by mouth every Monday, Wednesday, and Friday with hemodialysis.  09/17/18  Yes Jean Rosenthal, MD  isosorbide mononitrate (IMDUR) 60 MG 24 hr tablet Take 1 tablet (60 mg total) by mouth daily. 05/20/17  Yes Geradine Girt, DO   Liver Function Tests No results for input(s): AST, ALT, ALKPHOS, BILITOT, PROT, ALBUMIN in the last 168 hours. No results for input(s): LIPASE, AMYLASE in the last 168 hours. CBC Recent Labs  Lab 10/26/18 0634 10/26/18 0811 10/26/18 0815  WBC  --  4.9  --   NEUTROABS  --  2.8  --   HGB 13.6 11.6* 13.6  HCT 40.0 37.3* 40.0  MCV  --  97.9  --   PLT  --  187  --    Basic Metabolic Panel Recent Labs  Lab 10/26/18 0634 10/26/18 0815  NA 137 136  K 4.8 5.0  CL  --  97*  GLUCOSE 102* 94  BUN  --  101*  CREATININE  --  16.00*   Iron/TIBC/Ferritin/ %Sat    Component Value Date/Time   IRON 57 07/15/2012 1806   TIBC 337 07/15/2012 1806   FERRITIN 27 07/15/2012 1806   IRONPCTSAT 17 (L) 07/15/2012 1806    Vitals:   10/26/18 1000 10/26/18 1030 10/26/18 1100 10/26/18 1130  BP: (!) 130/98 119/81 135/81 131/88  Pulse: (!) 59 61 62 (!) 57  Resp:      Temp:      TempSrc:      SpO2:      Weight:      Height:       Exam Gen alert, dyspneic, not in distress, nasal O2 No rash, cyanosis or gangrene Sclera anicteric, throat clear  +jvd no bruits Chest bilat basilar crackles RRR no  MRG Abd soft ntnd no mass +ascites moderate, +bs GU normal male MS no joint effusions or deformity Ext 1+ bilat LE edema, no wounds or ulcers Neuro is alert, Ox 3 , nf    Home meds:  - carvedilol 25 bid/ isosorbide mononitrate 60 qd  - clonazepam 0.5mg  prn mwf  - calcium acetate 3-5 w/ large meals  CXR - bilat mild-mod CHF  Dialysis: MWF   4h  76kg  2/2 bath  Hep 5000  LFA AVF  - vit D 0.25     Impression/ Plan: 1. Dyspnea - in pt w/ missed HD x 2, no infection symptoms, suspect vol overload. Plan HD asap this am , get volume down.   2. Volume - is 5-6kg over dry wt and vol overloaded on exam 3. ESRD on HD MWF 4. HTN - cont meds 5. Anemia ckd - not an issue at this time 6. MBD ckd - cont meds 7. Combined CHF - low EF 8. Tobacco use     Kelly Splinter MD [employer] pager [phone]   10/26/2018, 11:51 AM

## 2018-10-26 NOTE — Procedures (Signed)
   I was present at this dialysis session, have reviewed the session itself and made  appropriate changes Kelly Splinter MD [place] pager [phone]   10/26/2018, 12:08 PM

## 2018-10-26 NOTE — Progress Notes (Signed)
   Patient presented for elective fistula revision today.  Unfortunately he has not had dialysis for 1 week and is short of breath on arrival with rales bilaterally and cannot lay flat.  I discussed with Dr. Jonnie Finner of nephrology we will send him to the emergency department for evaluation.  Fistula revision will be rescheduled.  Ilisha Blust C. Donzetta Matters, MD [facility] [place] Office: [phone] Pager: [phone]

## 2018-10-26 NOTE — ED Provider Notes (Signed)
[facility] EMERGENCY DEPARTMENT Provider Note   CSN: [id number] Arrival date & time: 10/26/18  0759     History   Chief Complaint Chief Complaint  Patient presents with  . Shortness of Breath    HPI [name] [name] is a 52 y.o. male.  Patient is a 52 year old male with past medical history of end-stage renal disease on hemodialysis, polysubstance abuse, and medical noncompliance.  He presents today for evaluation of shortness of breath.  He was to have a procedure today to revise the fistula in his left biceps region.  When he arrived at same-day surgery, he informed them that he has not had dialysis in 1 week and felt somewhat short of breath.  He was then sent here for dialysis and further evaluation.  The patient complains of mild shortness of breath, but has no other complaints.  The history is provided by the patient.  Shortness of Breath  This is a new problem. The average episode lasts 2 days. The problem occurs continuously.The problem has been gradually worsening. Pertinent negatives include no fever, no cough and no chest pain. He has tried nothing for the symptoms. The treatment provided no relief.    Past Medical History:  Diagnosis Date  . Abdominal distension 08/10/2018  . Alcohol abuse 11/26/2010   Qualifier: Diagnosis of  By: [name] MD, Benjamine Mola    . Anemia due to blood loss, acute 07/15/2012  . CHF (congestive heart failure) ([place])   . Cholecystitis 04/18/2014  . Cigarette smoker 11/26/2010   Qualifier: Diagnosis of  By: [name] MD, Benjamine Mola    . Cocaine abuse ([place]) 11/26/2010   Qualifier: Diagnosis of  By: [name] MD, Benjamine Mola    . Combined congestive systolic and diastolic heart failure ([place]) 04/18/2014   A. Echo 8/13: Severe LVH, EF 40-45%, inferoposterior akinesis, grade 2 diastolic dysfunction, moderate LAE, mild RVE, mildly reduced RVSF, mild RAE; cannot rule out R atrial mass-suggest TEE or cardiac MRI  //  B. Echo 3/17: Mild LVH, EF  25-30%, diffuse HK, grade 2 diastolic dysfunction, mild MR, severe LAE,  moderately reduced RVSF, severe RAE, mild TR, moderate PI, PASP 55 mmHg    . Dyspnea   . ESRD (end stage renal disease) on dialysis [facility])    MWF- [place] (05/18/2017)  . Hemodialysis patient ([place])    M,W,F  . Hernia, inguinal, right 08/05/2016  . Hypertension   . 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 Hypocalcemia 07/15/2012  . Medical non-compliance   . Non-ischemic cardiomyopathy ([place])    A. R/L [facility] 3/17: Normal coronary arteries, moderate pulmonary hypertension (PASP 65 mmHg), elevated LV filling pressures (LVEDP 45 mmHg)   . NSVT (nonsustained ventricular tachycardia) ([place]) 02/22/2016  . NSVT (nonsustained ventricular tachycardia) ([place]) 02/22/2016  . Pneumonia 11/12/2017  . Polysubstance abuse ([place])   . Prolonged Q-T interval on ECG 05/18/2017  . Prolonged QT interval 05/18/2017  . Restless leg syndrome   . Solitary pulmonary nodule 10/10/2015   See cxr 10/09/2015 - CT rec 10/10/2015 >>>   . Upper airway cough syndrome 10/09/2015   Off ACEi around 1st Oct 2016  - Sinus CT 10/10/2015 >>>      Patient Active Problem List   Diagnosis Date Noted  . Abdominal distension 08/10/2018  . Pneumonia 11/12/2017  . Prolonged QT interval 05/18/2017  . Prolonged Q-T interval on ECG 05/18/2017  .  Hernia, inguinal, right 08/05/2016  . Volume overload 02/20/2016  . Solitary pulmonary nodule 10/10/2015  . Upper airway cough syndrome 10/09/2015  . Combined congestive systolic and diastolic heart failure ([place]) 04/18/2014  . Anemia due to blood loss, acute 07/15/2012  . Hypocalcemia 07/15/2012  . Alcohol abuse 11/26/2010  . Cigarette smoker 11/26/2010  . Cocaine abuse ([place]) 11/26/2010  . ESRD (end stage renal disease) on dialysis ([place]) 11/26/2010  . Hypertensive heart and kidney disease with heart  failure and end-stage renal failure ([place]) 07/13/2009    Past Surgical History:  Procedure Laterality Date  . [place] TRANSPOSITION  01/19/2013   Procedure: BASCILIC VEIN TRANSPOSITION;  Surgeon: Conrad [place], MD;  Location: [place];  Service: Vascular;  Laterality: Left;  left 2nd stage basilic vein transposition  . CARDIAC CATHETERIZATION N/A 02/25/2016   Procedure: Right/Left Heart Cath and Coronary Angiography;  Surgeon: Burnell Blanks, MD;  Location: [place] CV LAB;  Service: Cardiovascular;  Laterality: N/A;  . INGUINAL HERNIA REPAIR Right 08/05/2016   Procedure: RIGHT INGUINAL HERNIA REPAIR WITH MESH;  Surgeon: Donnie Mesa, MD;  Location: [place];  Service: General;  Laterality: Right;  . INSERTION OF DIALYSIS CATHETER  07/17/2012   Procedure: INSERTION OF DIALYSIS CATHETER;  Surgeon: Conrad [place], MD;  Location: [place];  Service: Vascular;  Laterality: Right;  right internal jugular  . INSERTION OF MESH Right 08/05/2016   Procedure: INSERTION OF MESH;  Surgeon: Donnie Mesa, MD;  Location: [place];  Service: General;  Laterality: Right;  . REVISON OF ARTERIOVENOUS FISTULA Left 01/06/2017   Procedure: REVISON OF ARTERIOVENOUS FISTULA;  Surgeon: Conrad [place], MD;  Location: [place];  Service: Vascular;  Laterality: Left;  . UMBILICAL HERNIA REPAIR N/A 08/05/2016   Procedure: [place];  Surgeon: Donnie Mesa, MD;  Location: [place];  Service: General;  Laterality: N/A;  . VENOGRAM N/A 08/09/2012   Procedure: VENOGRAM;  Surgeon: Conrad [place], MD;  Location: [facility] CATH LAB;  Service: Cardiovascular;  Laterality: N/A;        Home Medications    Prior to Admission medications   Medication Sig Start Date End Date Taking? Authorizing Provider  calcium acetate (PHOSLO) 667 MG capsule Take 3 capsules (2,001 mg total) by mouth 3 (three) times daily with meals. Patient taking differently: Take 1,334-3,335 mg by mouth See admin instructions. Take 3-5 capsules (2001 -  3335 mg) by mouth 3 times daily with large meals, take 2 capsules (1334 mg) with snacks 04/21/14   Mikhail, Velta [place], DO  carvedilol (COREG) 25 MG tablet Take 1 tablet (25 mg total) by mouth 2 (two) times daily. 05/20/17   Geradine Girt, DO  clonazePAM (KLONOPIN) 0.5 MG tablet Take 1 tablet (0.5 mg total) by mouth as needed. Patient taking differently: Take 0.5 mg by mouth every Monday, Wednesday, and Friday with hemodialysis.  09/17/18   Jean Rosenthal, MD  isosorbide mononitrate (IMDUR) 60 MG 24 hr tablet Take 1 tablet (60 mg total) by mouth daily. 05/20/17   Geradine Girt, DO    Family History Family History  Problem Relation Age of Onset  . Hypertension Mother   . Diabetes Mother   . Renal Disease Mother   . Hypertension Father     Social History Social History   Tobacco Use  . Smoking status: Current Every Day Smoker    Packs/day: 0.10    Years: 30.00    Pack years: 3.00    Types:  Cigarettes  . Smokeless tobacco: Never Used  . Tobacco comment: Cutting back   Substance Use Topics  . Alcohol use: Yes    Alcohol/week: 6.0 standard drinks    Types: 6 Cans of beer per week  . Drug use: No    Types: Marijuana    Comment: 05/18/2017 "qd"     Allergies   Losartan   Review of Systems Review of Systems  Constitutional: Negative for fever.  Respiratory: Positive for shortness of breath. Negative for cough.   Cardiovascular: Negative for chest pain.  All other systems reviewed and are negative.    Physical Exam Updated Vital Signs Ht 5\' 9"  (1.753 m)   Wt 81.6 kg   BMI 26.58 kg/m   Physical Exam  Constitutional: He is oriented to person, place, and time. He appears well-developed and well-nourished. No distress.  HENT:  Head: Normocephalic and atraumatic.  Mouth/Throat: Oropharynx is clear and moist.  Neck: Normal range of motion. Neck supple.  Cardiovascular: Normal rate and regular rhythm. Exam reveals no friction rub.  No murmur heard. Pulmonary/Chest: Effort  normal and breath sounds normal. No respiratory distress. He has no wheezes. He has no rales.  Abdominal: Soft. Bowel sounds are normal. He exhibits no distension. There is no tenderness.  Musculoskeletal: Normal range of motion. He exhibits no edema.  Neurological: He is alert and oriented to person, place, and time. Coordination normal.  Skin: Skin is warm and dry. He is not diaphoretic.  Nursing note and vitals reviewed.    ED Treatments / Results  Labs (all labs ordered are listed, but only abnormal results are displayed) Labs Reviewed  CBC WITH DIFFERENTIAL/PLATELET  I-STAT CHEM 8, ED    EKG None  Radiology No results found.  Procedures Procedures (including critical care time)  Medications Ordered in ED Medications - No data to display   Initial Impression / Assessment and Plan / ED Course  I have reviewed the triage vital signs and the nursing notes.  Pertinent labs & imaging results that were available during my care of the patient were reviewed by me and considered in my medical decision making (see chart for details).  Patient presenting with complaints of shortness of breath.  He was to undergo a procedure to revise his dialysis fistula, however has not been dialyzed in 1 week as he has been out of town.  He was sent here for emergent dialysis and admission.  Vascular surgery plans to revise his fistula in the morning once his dialysis is complete.  Dr. Melvia Heaps is aware of the patient and is requesting admission to the hospitalist service.  Final Clinical Impressions(s) / ED Diagnoses   Final diagnoses:  None    ED Discharge Orders    None       Veryl Speak, MD 10/26/18 1539

## 2018-10-26 NOTE — Progress Notes (Signed)
New Admission Note: Patient admitted to 5M01  Arrival Method: via stretcher Mental Orientation:  A&O x4  Telemetry: Box # 3 Assessment: Completed Skin: dry and intact IV: SL right FA g # 20 Pain: denies of any pain Tubes: none Safety Measures: Safety Fall Prevention Plan has been given, discussed and signed Admission: Completed [facility] Orientation: Patient has been orientated to the room, unit and staff.  Family: unavailable at this time  Orders have been reviewed and implemented. Will continue to monitor the patient. Call light has been placed within reach and bed alarm has been activated.   Dolores Hoose, RN

## 2018-10-26 NOTE — ED Notes (Signed)
Report given to [place] in hemodialysis.

## 2018-10-26 NOTE — Progress Notes (Signed)
Pt. Reported last dialysis was 1 week ago. Stated he went on vacation. Reports he has become more short of breath since yesterday. Dr. Deatra Canter notified.

## 2018-10-26 NOTE — ED Triage Notes (Signed)
Pt is coming from short stay where he was scheduled to have a procedure on his dialysis fistula. Pt stated that he has been short of breath since yesterday and hasn't had dialysis in a week due to being on vacation.

## 2018-10-27 ENCOUNTER — Observation Stay (HOSPITAL_COMMUNITY): Payer: Medicare Other | Admitting: Certified Registered"

## 2018-10-27 ENCOUNTER — Encounter (HOSPITAL_COMMUNITY): Payer: Self-pay | Admitting: Certified Registered"

## 2018-10-27 ENCOUNTER — Encounter (HOSPITAL_COMMUNITY)
Admission: EM | Disposition: A | Discharge status: Home / Self Care | Payer: Self-pay | Source: Home / Self Care | Attending: Emergency Medicine

## 2018-10-27 DIAGNOSIS — Z79899 Other long term (current) drug therapy: Secondary | ICD-10-CM | POA: Diagnosis not present

## 2018-10-27 DIAGNOSIS — I160 Hypertensive urgency: Secondary | ICD-10-CM | POA: Diagnosis not present

## 2018-10-27 DIAGNOSIS — Z992 Dependence on renal dialysis: Secondary | ICD-10-CM

## 2018-10-27 DIAGNOSIS — N186 End stage renal disease: Secondary | ICD-10-CM | POA: Diagnosis not present

## 2018-10-27 DIAGNOSIS — I132 Hypertensive heart and chronic kidney disease with heart failure and with stage 5 chronic kidney disease, or end stage renal disease: Secondary | ICD-10-CM | POA: Diagnosis not present

## 2018-10-27 DIAGNOSIS — T82898A Other specified complication of vascular prosthetic devices, implants and grafts, initial encounter: Secondary | ICD-10-CM | POA: Diagnosis not present

## 2018-10-27 DIAGNOSIS — I5043 Acute on chronic combined systolic (congestive) and diastolic (congestive) heart failure: Secondary | ICD-10-CM

## 2018-10-27 HISTORY — PX: REVISON OF ARTERIOVENOUS FISTULA: SHX6074

## 2018-10-27 LAB — BASIC METABOLIC PANEL
ANION GAP: 14 (ref 5–15)
BUN: 58 mg/dL — ABNORMAL HIGH (ref 6–20)
CALCIUM: 7.8 mg/dL — AB (ref 8.9–10.3)
CHLORIDE: 100 mmol/L (ref 98–111)
CO2: 24 mmol/L (ref 22–32)
Creatinine, Ser: 10.46 mg/dL — ABNORMAL HIGH (ref 0.61–1.24)
GFR calc Af Amer: 6 mL/min — ABNORMAL LOW (ref >60)
GFR, EST NON AFRICAN AMERICAN: 5 mL/min — AB (ref >60)
Glucose, Bld: 116 mg/dL — ABNORMAL HIGH (ref 70–99)
POTASSIUM: 4 mmol/L (ref 3.5–5.1)
Sodium: 138 mmol/L (ref 135–145)

## 2018-10-27 LAB — HEPATITIS B SURFACE ANTIGEN: HEP B S AG: NEGATIVE

## 2018-10-27 LAB — PROTIME-INR
INR: 1.36
PROTHROMBIN TIME: 16.6 s — AB (ref 11.4–15.2)

## 2018-10-27 LAB — CBC
HCT: 35 % — ABNORMAL LOW (ref 39.0–52.0)
HEMOGLOBIN: 11.2 g/dL — AB (ref 13.0–17.0)
MCH: 30.8 pg (ref 26.0–34.0)
MCHC: 32 g/dL (ref 30.0–36.0)
MCV: 96.2 fL (ref 80.0–100.0)
Platelets: 148 10*3/uL — ABNORMAL LOW (ref 150–400)
RBC: 3.64 MIL/uL — AB (ref 4.22–5.81)
RDW: 15.7 % — ABNORMAL HIGH (ref 11.5–15.5)
WBC: 4.3 10*3/uL (ref 4.0–10.5)
nRBC: 0 % (ref 0.0–0.2)

## 2018-10-27 SURGERY — REVISON OF ARTERIOVENOUS FISTULA
Anesthesia: General | Site: Arm Upper | Laterality: Left

## 2018-10-27 MED ORDER — PROPOFOL 10 MG/ML IV BOLUS
INTRAVENOUS | Status: DC | PRN
  Administered 2018-10-27: 160 mg via INTRAVENOUS

## 2018-10-27 MED ORDER — GLYCOPYRROLATE PF 0.2 MG/ML IJ SOSY
PREFILLED_SYRINGE | INTRAMUSCULAR | Status: DC | PRN
  Administered 2018-10-27 (×2): .2 mg via INTRAVENOUS

## 2018-10-27 MED ORDER — GLYCOPYRROLATE PF 0.2 MG/ML IJ SOSY
PREFILLED_SYRINGE | INTRAMUSCULAR | Status: AC
  Filled 2018-10-27: qty 1

## 2018-10-27 MED ORDER — LIDOCAINE 2% (20 MG/ML) 5 ML SYRINGE
INTRAMUSCULAR | Status: AC
  Filled 2018-10-27: qty 5

## 2018-10-27 MED ORDER — FENTANYL CITRATE (PF) 250 MCG/5ML IJ SOLN
INTRAMUSCULAR | Status: AC
  Filled 2018-10-27: qty 5

## 2018-10-27 MED ORDER — 0.9 % SODIUM CHLORIDE (POUR BTL) OPTIME
TOPICAL | Status: DC | PRN
  Administered 2018-10-27: 1000 mL

## 2018-10-27 MED ORDER — MIDAZOLAM HCL 2 MG/2ML IJ SOLN
INTRAMUSCULAR | Status: AC
  Filled 2018-10-27: qty 2

## 2018-10-27 MED ORDER — EPHEDRINE SULFATE-NACL 50-0.9 MG/10ML-% IV SOSY
PREFILLED_SYRINGE | INTRAVENOUS | Status: DC | PRN
  Administered 2018-10-27 (×3): 15 mg via INTRAVENOUS
  Administered 2018-10-27 (×2): 10 mg via INTRAVENOUS
  Administered 2018-10-27: 15 mg via INTRAVENOUS

## 2018-10-27 MED ORDER — CHLORHEXIDINE GLUCONATE CLOTH 2 % EX PADS: 6.0000 | MEDICATED_PAD | Freq: Every day | CUTANEOUS | Status: DC

## 2018-10-27 MED ORDER — SODIUM CHLORIDE 0.9 % IV SOLN
INTRAVENOUS | Status: DC | PRN
  Administered 2018-10-27: 12:00:00

## 2018-10-27 MED ORDER — DEXAMETHASONE SODIUM PHOSPHATE 10 MG/ML IJ SOLN
INTRAMUSCULAR | Status: DC | PRN
  Administered 2018-10-27: 10 mg via INTRAVENOUS

## 2018-10-27 MED ORDER — PHENYLEPHRINE 40 MCG/ML (10ML) SYRINGE FOR IV PUSH (FOR BLOOD PRESSURE SUPPORT)
PREFILLED_SYRINGE | INTRAVENOUS | Status: AC
  Filled 2018-10-27: qty 10

## 2018-10-27 MED ORDER — OXYCODONE HCL 5 MG/5ML PO SOLN: 5.0000 mg | Freq: Once | ORAL | Status: DC | PRN

## 2018-10-27 MED ORDER — HYDROCODONE-ACETAMINOPHEN 5-325 MG PO TABS
1.0000 | ORAL_TABLET | ORAL | Status: DC | PRN
  Administered 2018-10-27 – 2018-10-28 (×4): 2 via ORAL
  Filled 2018-10-27 (×3): qty 2

## 2018-10-27 MED ORDER — FENTANYL CITRATE (PF) 100 MCG/2ML IJ SOLN
INTRAMUSCULAR | Status: DC | PRN
  Administered 2018-10-27 (×2): 50 ug via INTRAVENOUS

## 2018-10-27 MED ORDER — VASOPRESSIN 20 UNIT/ML IV SOLN
INTRAVENOUS | Status: DC | PRN
  Administered 2018-10-27: 1 [IU] via INTRAVENOUS

## 2018-10-27 MED ORDER — LIDOCAINE 2% (20 MG/ML) 5 ML SYRINGE
INTRAMUSCULAR | Status: DC | PRN
  Administered 2018-10-27: 60 mg via INTRAVENOUS

## 2018-10-27 MED ORDER — SODIUM CHLORIDE 0.9 % IV SOLN
INTRAVENOUS | Status: AC
  Filled 2018-10-27: qty 1.2

## 2018-10-27 MED ORDER — HYDROCODONE-ACETAMINOPHEN 5-325 MG PO TABS
ORAL_TABLET | ORAL | Status: AC
  Filled 2018-10-27: qty 2

## 2018-10-27 MED ORDER — MORPHINE SULFATE (PF) 2 MG/ML IV SOLN: 2.0000 mg | INTRAVENOUS | Status: AC | PRN

## 2018-10-27 MED ORDER — PHENYLEPHRINE HCL 10 MG/ML IJ SOLN
INTRAMUSCULAR | Status: AC
  Filled 2018-10-27: qty 1

## 2018-10-27 MED ORDER — HYDROCODONE-ACETAMINOPHEN 5-325 MG PO TABS: 1.0000 | ORAL_TABLET | Freq: Four times a day (QID) | ORAL | 0 refills | Status: DC | PRN

## 2018-10-27 MED ORDER — VASOPRESSIN 20 UNIT/ML IV SOLN
INTRAVENOUS | Status: AC
  Filled 2018-10-27: qty 1

## 2018-10-27 MED ORDER — FENTANYL CITRATE (PF) 100 MCG/2ML IJ SOLN: 25.0000 ug | INTRAMUSCULAR | Status: DC | PRN

## 2018-10-27 MED ORDER — LIDOCAINE HCL (PF) 1 % IJ SOLN
INTRAMUSCULAR | Status: AC
  Filled 2018-10-27: qty 30

## 2018-10-27 MED ORDER — MIDAZOLAM HCL 5 MG/5ML IJ SOLN
INTRAMUSCULAR | Status: DC | PRN
  Administered 2018-10-27: 2 mg via INTRAVENOUS

## 2018-10-27 MED ORDER — EPHEDRINE 5 MG/ML INJ
INTRAVENOUS | Status: AC
  Filled 2018-10-27: qty 10

## 2018-10-27 MED ORDER — SODIUM CHLORIDE 0.9 % IV SOLN
INTRAVENOUS | Status: DC
  Administered 2018-10-27 (×2): via INTRAVENOUS

## 2018-10-27 MED ORDER — ONDANSETRON HCL 4 MG/2ML IJ SOLN
INTRAMUSCULAR | Status: DC | PRN
  Administered 2018-10-27: 4 mg via INTRAVENOUS

## 2018-10-27 MED ORDER — SODIUM CHLORIDE 0.9 % IV SOLN
INTRAVENOUS | Status: DC | PRN
  Administered 2018-10-27: 50 ug/min via INTRAVENOUS

## 2018-10-27 MED ORDER — DEXAMETHASONE SODIUM PHOSPHATE 10 MG/ML IJ SOLN
INTRAMUSCULAR | Status: AC
  Filled 2018-10-27: qty 1

## 2018-10-27 MED ORDER — PROPOFOL 10 MG/ML IV BOLUS
INTRAVENOUS | Status: AC
  Filled 2018-10-27: qty 20

## 2018-10-27 MED ORDER — PHENYLEPHRINE 40 MCG/ML (10ML) SYRINGE FOR IV PUSH (FOR BLOOD PRESSURE SUPPORT)
PREFILLED_SYRINGE | INTRAVENOUS | Status: DC | PRN
  Administered 2018-10-27: 120 ug via INTRAVENOUS
  Administered 2018-10-27: 160 ug via INTRAVENOUS
  Administered 2018-10-27: 120 ug via INTRAVENOUS

## 2018-10-27 MED ORDER — OXYCODONE HCL 5 MG PO TABS: 5.0000 mg | ORAL_TABLET | Freq: Once | ORAL | Status: DC | PRN

## 2018-10-27 MED ORDER — ONDANSETRON HCL 4 MG/2ML IJ SOLN
INTRAMUSCULAR | Status: AC
  Filled 2018-10-27: qty 2

## 2018-10-27 SURGICAL SUPPLY — 33 items
ARMBAND PINK RESTRICT EXTREMIT (MISCELLANEOUS) ×3 IMPLANT
CANISTER SUCT 3000ML PPV (MISCELLANEOUS) ×3 IMPLANT
CLIP VESOCCLUDE MED 6/CT (CLIP) ×3 IMPLANT
CLIP VESOCCLUDE SM WIDE 6/CT (CLIP) ×3 IMPLANT
COVER PROBE W GEL 5X96 (DRAPES) ×3 IMPLANT
COVER WAND RF STERILE (DRAPES) ×3 IMPLANT
DERMABOND ADVANCED (GAUZE/BANDAGES/DRESSINGS) ×2
DERMABOND ADVANCED .7 DNX12 (GAUZE/BANDAGES/DRESSINGS) ×1 IMPLANT
ELECT REM PT RETURN 9FT ADLT (ELECTROSURGICAL) ×3
ELECTRODE REM PT RTRN 9FT ADLT (ELECTROSURGICAL) ×1 IMPLANT
GAUZE SPONGE 4X4 12PLY STRL LF (GAUZE/BANDAGES/DRESSINGS) ×3 IMPLANT
GLOVE BIO SURGEON STRL SZ7 (GLOVE) ×3 IMPLANT
GLOVE BIO SURGEON STRL SZ7.5 (GLOVE) ×9 IMPLANT
GLOVE INDICATOR 7.0 STRL GRN (GLOVE) ×6 IMPLANT
GOWN STRL REUS W/ TWL LRG LVL3 (GOWN DISPOSABLE) ×3 IMPLANT
GOWN STRL REUS W/ TWL XL LVL3 (GOWN DISPOSABLE) ×3 IMPLANT
GOWN STRL REUS W/TWL LRG LVL3 (GOWN DISPOSABLE) ×6
GOWN STRL REUS W/TWL XL LVL3 (GOWN DISPOSABLE) ×6
KIT BASIN OR (CUSTOM PROCEDURE TRAY) ×3 IMPLANT
KIT TURNOVER KIT B (KITS) ×3 IMPLANT
NS IRRIG 1000ML POUR BTL (IV SOLUTION) ×3 IMPLANT
PACK CV ACCESS (CUSTOM PROCEDURE TRAY) ×3 IMPLANT
PAD ARMBOARD 7.5X6 YLW CONV (MISCELLANEOUS) ×6 IMPLANT
STAPLER VISISTAT 35W (STAPLE) ×3 IMPLANT
SUT MNCRL AB 4-0 PS2 18 (SUTURE) ×3 IMPLANT
SUT PROLENE 5 0 C 1 24 (SUTURE) ×6 IMPLANT
SUT PROLENE 6 0 BV (SUTURE) ×3 IMPLANT
SUT VIC AB 3-0 SH 27 (SUTURE) ×2
SUT VIC AB 3-0 SH 27X BRD (SUTURE) ×1 IMPLANT
TAPE CLOTH SURG 4X10 WHT LF (GAUZE/BANDAGES/DRESSINGS) ×3 IMPLANT
TOWEL GREEN STERILE (TOWEL DISPOSABLE) ×3 IMPLANT
UNDERPAD 30X30 (UNDERPADS AND DIAPERS) ×3 IMPLANT
WATER STERILE IRR 1000ML POUR (IV SOLUTION) ×3 IMPLANT

## 2018-10-27 NOTE — Progress Notes (Signed)
  Progress Note    10/27/2018 11:00 AM Day of Surgery  Subjective: feeling better today  Vitals:   10/27/18 0411 10/27/18 0949  BP: 116/89 (!) 114/93  Pulse: 60 (!) 58  Resp: 18 18  Temp: 97.7 F (36.5 C) 98.3 F (36.8 C)  SpO2: 100% 99%    Physical Exam: aaox3 Non labored respirations Left arm avf with thrill, 2 areas in close connection with ulceration  CBC    Component Value Date/Time   WBC 4.3 10/27/2018 0521   RBC 3.64 (L) 10/27/2018 0521   HGB 11.2 (L) 10/27/2018 0521   HGB 12.3 (L) 12/31/2017 1012   HCT 35.0 (L) 10/27/2018 0521   HCT 37.4 (L) 12/31/2017 1012   PLT 148 (L) 10/27/2018 0521   PLT 176 12/31/2017 1012   MCV 96.2 10/27/2018 0521   MCV 99 (H) 12/31/2017 1012   MCH 30.8 10/27/2018 0521   MCHC 32.0 10/27/2018 0521   RDW 15.7 (H) 10/27/2018 0521   RDW 18.8 (H) 12/31/2017 1012   LYMPHSABS 1.2 10/26/2018 0811   LYMPHSABS 1.0 12/31/2017 1012   MONOABS 0.7 10/26/2018 0811   EOSABS 0.2 10/26/2018 0811   EOSABS 0.1 12/31/2017 1012   BASOSABS 0.1 10/26/2018 0811   BASOSABS 0.0 12/31/2017 1012    BMET    Component Value Date/Time   NA 138 10/27/2018 0521   K 4.0 10/27/2018 0521   CL 100 10/27/2018 0521   CO2 24 10/27/2018 0521   GLUCOSE 116 (H) 10/27/2018 0521   BUN 58 (H) 10/27/2018 0521   CREATININE 10.46 (H) 10/27/2018 0521   CALCIUM 7.8 (L) 10/27/2018 0521   CALCIUM 8.2 (L) 10/21/2010 1420   GFRNONAA 5 (L) 10/27/2018 0521   GFRAA 6 (L) 10/27/2018 0521    INR    Component Value Date/Time   INR 1.36 10/27/2018 0521     Intake/Output Summary (Last 24 hours) at 10/27/2018 1100 Last data filed at 10/27/2018 0930 Gross per 24 hour  Intake 80 ml  Output 3293 ml  Net -3213 ml     Assessment/plan:  52 y.o. male is here for revision of left arm AV fistula.  He required dialysis yesterday and so operation was canceled.  He is feeling much better today without shortness of breath and we will plan for revision of left arm AV fistula  possible placement of Artegraft.  I discussed the risk benefits alternatives she agrees to proceed.   Biran Mayberry C. Donzetta Matters, MD [facility] [place] Office: [phone] Pager: [phone]  10/27/2018 11:00 AM

## 2018-10-27 NOTE — Op Note (Signed)
    Patient name: [name] [name] MRN: [id number] DOB: [birth year] Sex: male  10/27/2018 Pre-operative Diagnosis: esrd Post-operative diagnosis:  Same Surgeon:  Erlene Quan C. Donzetta Matters, MD  Assistant: Arlee Muslim, PA Procedure Performed: Revision left arm AV fistula with primary plication  Indications: 52 year old male with end-stage renal disease on dialysis via left arm AV fistula.  He has 2 areas of ulceration with concern for bleeding and is indicated for revision.  Findings: Fistula itself was densely adhered to the skin.  At completion there was a thrill throughout the fistula palpable radial pulse the wrist skin was closed primarily with staples.  There should be an area above and below the fistula for cannulation.   Procedure:  The patient was identified in the holding area and taken to the operating room where is placed supine the operative table and LMA anesthesia induced.  Sterilely prepped and draped in the left upper extremity usual fashion, antibiotics were administered, a timeout was called.  We began with longitudinal incisions at either end of the concerning area of the fistula and obtained proximal and distal control placing Vesseloops around the fistula.  I then clamped distally and proximally and opened the entire skin with 10 blade with an elliptical incision to remove the infected skin.  Flushed with heparinized saline both directions.  We then primarily repaired the fistula with running 5-0 Prolene suture in a mattress fashion.  Prior to completing the suture line we flushed all directions.  Upon completion there was good pulsatility with compression of the fistula.  He had a palpable radial pulse this was confirmed with Doppler.  We mobilized some skin around the fistula.  Interrupted 3-0 Vicryl sutures were placed and the skin was closed with staples.  Dry dressing was placed.  His blood away from anesthesia having tolerated procedure without immediate comp occasion.  All counts were  correct at completion.  EBL 50 cc   [name] C. Donzetta Matters, MD [facility] Office: [phone] Pager: [phone]

## 2018-10-27 NOTE — Anesthesia Procedure Notes (Signed)
Procedure Name: LMA Insertion Date/Time: 10/27/2018 11:39 AM Performed by: Moshe Salisbury, CRNA Pre-anesthesia Checklist: Patient identified, Emergency Drugs available, Suction available and Patient being monitored Patient Re-evaluated:Patient Re-evaluated prior to induction Oxygen Delivery Method: Circle System Utilized Preoxygenation: Pre-oxygenation with 100% oxygen Induction Type: IV induction Ventilation: Mask ventilation without difficulty LMA: LMA inserted LMA Size: 5.0 Number of attempts: 1 Placement Confirmation: positive ETCO2 Tube secured with: Tape Dental Injury: Teeth and Oropharynx as per pre-operative assessment

## 2018-10-27 NOTE — Discharge Instructions (Signed)
° °  [facility] ° °Discharge Instructions ° °AV Fistula or Graft Surgery for Dialysis Access ° °Please refer to the following instructions for your post-procedure care. Your surgeon or physician assistant will discuss any changes with you. ° °Activity ° °You may drive the day following your surgery, if you are comfortable and no longer taking prescription pain medication. Resume full activity as the soreness in your incision resolves. ° °Bathing/Showering ° °You may shower after you go home. Keep your incision dry for 48 hours. Do not soak in a bathtub, hot tub, or swim until the incision heals completely. You may not shower if you have a hemodialysis catheter. ° °Incision Care ° °Clean your incision with mild soap and water after 48 hours. Pat the area dry with a clean towel. You do not need a bandage unless otherwise instructed. Do not apply any ointments or creams to your incision. You may have skin glue on your incision. Do not peel it off. It will come off on its own in about one week. Your arm may swell a bit after surgery. To reduce swelling use pillows to elevate your arm so it is above your heart. Your doctor will tell you if you need to lightly wrap your arm with an ACE bandage. ° °Diet ° °Resume your normal diet. There are not special food restrictions following this procedure. In order to heal from your surgery, it is CRITICAL to get adequate nutrition. Your body requires vitamins, minerals, and protein. Vegetables are the best source of vitamins and minerals. Vegetables also provide the perfect balance of protein. Processed food has little nutritional value, so try to avoid this. ° °Medications ° °Resume taking all of your medications. If your incision is causing pain, you may take over-the counter pain relievers such as acetaminophen (Tylenol). If you were prescribed a stronger pain medication, please be aware these medications can cause nausea and constipation. Prevent  nausea by taking the medication with a snack or meal. Avoid constipation by drinking plenty of fluids and eating foods with high amount of fiber, such as fruits, vegetables, and grains. Do not take Tylenol if you are taking prescription pain medications. ° ° ° ° °Follow up °Your surgeon may want to see you in the office following your access surgery. If so, this will be arranged at the time of your surgery. ° °Please call us immediately for any of the following conditions: ° °Increased pain, redness, drainage (pus) from your incision site °Fever of 101 degrees or higher °Severe or worsening pain at your incision site °Hand pain or numbness. ° °Reduce your risk of vascular disease: ° °Stop smoking. If you would like help, call [facility] at 1-800-QUIT-NOW (1-800-784-8669) or [place] at 336-586-4000 ° °Manage your cholesterol °Maintain a desired weight °Control your diabetes °Keep your blood pressure down ° °Dialysis ° °It will take several weeks to several months for your new dialysis access to be ready for use. Your surgeon will determine when it is OK to use it. Your nephrologist will continue to direct your dialysis. You can continue to use your Permcath until your new access is ready for use. ° °If you have any questions, please call the office at [phone]. ° °

## 2018-10-27 NOTE — Progress Notes (Signed)
   Subjective: Mr. Arts reported feeling well this morning. He said he missed 3 HD sessions last week because he went on an annual trip to the beach with friends. He said he has done this in the past. He denied any SOB or orthopnea today. He reported feeling well after HD. He said he has needed a fistula repair in the past and due to his religion he is not considering transplant options.   Objective:  Vital signs in last 24 hours: Vitals:   10/26/18 2028 10/27/18 0411 10/27/18 0949 10/27/18 1037  BP: 102/73 116/89 (!) 114/93   Pulse: (!) 59 60 (!) 58   Resp: 18 18 18    Temp: 98.9 F (37.2 C) 97.7 F (36.5 C) 98.3 F (36.8 C)   TempSrc: Oral Oral Oral   SpO2: 99% 100% 99%   Weight:    80 kg  Height:    5' 9.02" (1.753 m)   Physical Exam  Constitutional: He is oriented to person, place, and time and well-developed, well-nourished, and in no distress.  Cardiovascular: Normal rate, regular rhythm and normal heart sounds.  No murmur heard. Abdominal: Soft. Bowel sounds are normal. He exhibits no distension. There is no tenderness.  Musculoskeletal: He exhibits no edema.  Neurological: He is alert and oriented to person, place, and time.  Skin: Skin is warm and dry.    Assessment/Plan:  Principal Problem:   Acute on chronic combined systolic and diastolic CHF (congestive heart failure) (HCC) Active Problems:   ESRD needing dialysis ([place])  Volume overload secondary to missing hemodialysis for 1 week Patient had HD yesterday, denies SOB or orthopnea today.  No acute issues. Scheudled for fistula revision today. Will discuss with nephrology if patient needs HD today -fistula revision per Dr. Donzetta Matters today  Combined diastolic and systolic heart failure Continuing home medications - Carvedilol and IMDUR  Dispo: Anticipated discharge is today pending AV fistula repair and possible HD.   Mike Craze, DO 10/27/2018, 10:56 AM Pager: [phone]

## 2018-10-27 NOTE — Transfer of Care (Signed)
Immediate Anesthesia Transfer of Care Note  Patient: [name] [name]  Procedure(s) Performed: REVISION PLICATION OF ARTERIOVENOUS FISTULA  LEFT UPPER ARM (Left Arm Upper)  Patient Location: PACU  Anesthesia Type:General  Level of Consciousness: drowsy  Airway & Oxygen Therapy: Patient Spontanous Breathing and Patient connected to nasal cannula oxygen  Post-op Assessment: Report given to RN, Post -op Vital signs reviewed and stable and Patient moving all extremities  Post vital signs: Reviewed and stable  Last Vitals:  Vitals Value Taken Time  BP 96/64 10/27/2018  1:06 PM  Temp    Pulse 58 10/27/2018  1:05 PM  Resp 12 10/27/2018  1:07 PM  SpO2 68 % 10/27/2018  1:05 PM  Vitals shown include unvalidated device data.  Last Pain:  Vitals:   10/27/18 0949  TempSrc: Oral  PainSc:       Patients Stated Pain Goal: 0 (76/28/31 5176)  Complications: No apparent anesthesia complications

## 2018-10-27 NOTE — Anesthesia Preprocedure Evaluation (Signed)
Anesthesia Evaluation  Patient identified by MRN, date of birth, ID band Patient awake    Reviewed: Allergy & Precautions, NPO status , Patient's Chart, lab work & pertinent test results  Airway Mallampati: II   Neck ROM: full    Dental   Pulmonary shortness of breath, Current Smoker,    breath sounds clear to auscultation       Cardiovascular hypertension, +CHF   Rhythm:regular Rate:Normal     Neuro/Psych    GI/Hepatic   Endo/Other    Renal/GU ESRF and DialysisRenal disease     Musculoskeletal   Abdominal   Peds  Hematology  (+) Blood dyscrasia, anemia ,   Anesthesia Other Findings   Reproductive/Obstetrics                             Anesthesia Physical Anesthesia Plan  ASA: III  Anesthesia Plan: General   Post-op Pain Management:    Induction: Intravenous  PONV Risk Score and Plan: 1 and Ondansetron, Dexamethasone, Treatment may vary due to age or medical condition and Midazolam  Airway Management Planned: LMA  Additional Equipment:   Intra-op Plan:   Post-operative Plan:   Informed Consent: I have reviewed the patients History and Physical, chart, labs and discussed the procedure including the risks, benefits and alternatives for the proposed anesthesia with the patient or authorized representative who has indicated his/her understanding and acceptance.     Plan Discussed with: CRNA, Anesthesiologist and Surgeon  Anesthesia Plan Comments:         Anesthesia Quick Evaluation

## 2018-10-27 NOTE — Progress Notes (Signed)
[facility] Progress Note  Subjective: feeling a lot better, going for surgery this am  Vitals:   10/26/18 2028 10/27/18 0411 10/27/18 0949 10/27/18 1037  BP: 102/73 116/89 (!) 114/93   Pulse: (!) 59 60 (!) 58   Resp: 18 18 18    Temp: 98.9 F (37.2 C) 97.7 F (36.5 C) 98.3 F (36.8 C)   TempSrc: Oral Oral Oral   SpO2: 99% 100% 99%   Weight:    80 kg  Height:    5' 9.02" (1.753 m)    Inpatient medications: . [MAR Hold] carvedilol  25 mg Oral BID  . [MAR Hold] Chlorhexidine Gluconate Cloth  6 each Topical Q0600  . [MAR Hold] heparin  5,000 Units Dialysis Once in dialysis  . [MAR Hold] isosorbide mononitrate  60 mg Oral Daily   . sodium chloride 10 mL/hr at 10/27/18 1040  . [MAR Hold] cefUROXime (ZINACEF)  IV     [MAR Hold] acetaminophen **OR** [MAR Hold] acetaminophen, [MAR Hold] senna-docusate  Iron/TIBC/Ferritin/ %Sat    Component Value Date/Time   IRON 57 07/15/2012 1806   TIBC 337 07/15/2012 1806   FERRITIN 27 07/15/2012 1806   IRONPCTSAT 17 (L) 07/15/2012 1806    Exam: Gen alert, no distress, looks much better Chest clear bilat  RRR no MRG Abd soft ntnd no mass +bs Ext no edema Neuro is alert, Ox 3 , nf    Home meds:  - carvedilol 25 bid/ isosorbide mononitrate 60 qd  - clonazepam 0.5mg  prn mwf  - calcium acetate 3-5 w/ large meals  CXR - bilat mild-mod CHF  Dialysis: MWF   4h  76kg  2/2 bath  Hep 5000  LFA AVF  - vit D 0.25     Impression/ Plan: 1. Dyspnea - in pt w/ missed HD x 2, better after HD yest.  2. Volume - still over dry wt 3-4kg, get vol down w/ HD again today 3. ESRD on HD MWF. HD this afternoon after surgery.  4. HTN - cont meds 5. Anemia ckd - not an issue at this time 6. MBD ckd - cont meds 7. Combined CHF - low EF 8. Tobacco use 9. Dispo - stable for d/c from renal standpoint after HD this afternoon      Kelly Splinter MD [place] pager [phone]   10/27/2018, 11:02 AM    Recent Labs  Lab 10/26/18 0815 10/27/18 0521  NA 136 138  K 5.0 4.0  CL 97* 100  CO2  --  24  GLUCOSE 94 116*  BUN 101* 58*  CREATININE 16.00* 10.46*  CALCIUM  --  7.8*  INR  --  1.36   No results for input(s): AST, ALT, ALKPHOS, BILITOT, PROT in the last 168 hours. Recent Labs  Lab 10/26/18 0811 10/26/18 0815 10/27/18 0521  WBC 4.9  --  4.3  NEUTROABS 2.8  --   --   HGB 11.6* 13.6 11.2*  HCT 37.3* 40.0 35.0*  MCV 97.9  --  96.2  PLT 187  --  148*

## 2018-10-28 ENCOUNTER — Telehealth: Payer: Self-pay | Admitting: Vascular Surgery

## 2018-10-28 ENCOUNTER — Encounter (HOSPITAL_COMMUNITY): Payer: Self-pay | Admitting: Vascular Surgery

## 2018-10-28 DIAGNOSIS — Z992 Dependence on renal dialysis: Secondary | ICD-10-CM | POA: Diagnosis not present

## 2018-10-28 DIAGNOSIS — T82898A Other specified complication of vascular prosthetic devices, implants and grafts, initial encounter: Secondary | ICD-10-CM | POA: Diagnosis not present

## 2018-10-28 DIAGNOSIS — Z888 Allergy status to other drugs, medicaments and biological substances status: Secondary | ICD-10-CM

## 2018-10-28 DIAGNOSIS — Z79899 Other long term (current) drug therapy: Secondary | ICD-10-CM | POA: Diagnosis not present

## 2018-10-28 DIAGNOSIS — N186 End stage renal disease: Secondary | ICD-10-CM | POA: Diagnosis not present

## 2018-10-28 DIAGNOSIS — I5043 Acute on chronic combined systolic (congestive) and diastolic (congestive) heart failure: Secondary | ICD-10-CM | POA: Diagnosis not present

## 2018-10-28 NOTE — Progress Notes (Signed)
   Subjective: Mr. Gabrielle reported feeling well this morning. He is having some pain from his fistula repair. No other complaints.  Objective:  Vital signs in last 24 hours: Vitals:   10/27/18 2000 10/27/18 2045 10/27/18 2147 10/28/18 0516  BP: 106/61 116/83 106/60 127/78  Pulse: (!) 54 (!) 56 (!) 58 (!) 58  Resp: 16 17 17 20   Temp:  97.6 F (36.4 C) 98.4 F (36.9 C) 98.3 F (36.8 C)  TempSrc:  Oral Oral Oral  SpO2:  98% 100% 98%  Weight:  79.5 kg    Height:       Physical Exam  Constitutional: He is oriented to person, place, and time and well-developed, well-nourished, and in no distress.  Neurological: He is alert and oriented to person, place, and time.    Assessment/Plan:  Principal Problem:   Acute on chronic combined systolic and diastolic CHF (congestive heart failure) (HCC) Active Problems:   ESRD needing dialysis ([place])  Volume overload secondary to missing hemodialysis for 1 week Patient had HD yesterday, denies SOB or orthopnea today.Also had his AV fistula revision yesterday which was used for HD later the same day. Will need to continue HD MWF.   Dispo: Patient is medically stable for discharge today.  Mike Craze, DO 10/28/2018, 7:38 AM Pager: [phone]

## 2018-10-28 NOTE — Progress Notes (Addendum)
Subjective:  Tolerated hd post op AVF  revision  yesterday / no sob ,for dc today   Objective Vital signs in last 24 hours: Vitals:   10/27/18 2000 10/27/18 2045 10/27/18 2147 10/28/18 0516  BP: 106/61 116/83 106/60 127/78  Pulse: (!) 54 (!) 56 (!) 58 (!) 58  Resp: 16 17 17 20   Temp:  97.6 F (36.4 C) 98.4 F (36.9 C) 98.3 F (36.8 C)  TempSrc:  Oral Oral Oral  SpO2:  98% 100% 98%  Weight:  79.5 kg    Height:       Weight change: -1.647 kg  Physical Exam: Gen-alert, nad ,OX3 Chest- clear bilat Card- RRR no MRG Abd- soft nt ,no change in distention with ventral  Hernia,  no mass+bs Ext -no edema Dialysis Access: pos. Bruit LFA  AVF with ace wrap   Dialysis: [facility]  4h 76kg 2/2 bath Hep 5000 LFA AVF - vit D 0.25  Problem/Plan: 1.  Dyspnea/ Vol overload  - missed HD x 2, better after HD x 2   2. Volume -  uf yest on hd  Closer to edw  stable for dc 3. ESRD on HD MWF.  4. SP  Scheduled revision  Of  left arm AV fistula with primary plication- Dr Donzetta Matters  32/20/25 5. HTN - cont meds 6. Anemia ckd - hgb 11.2 no esa  7. MBD ckd - cont meds 8. Combined CHF - low EF 9. Tobacco use 10. Dispo - stable for d/c from renal standpoint   Ernest Haber, PA-C [facility] [phone] 10/28/2018,12:03 PM  LOS: 0 days   Pt seen, examined and agree w A/P as above.  Kelly Splinter MD [facility] pager [phone]   10/28/2018, 12:45 PM    Labs: Basic Metabolic Panel: Recent Labs  Lab 10/26/18 0634 10/26/18 0815 10/27/18 0521  NA 137 136 138  K 4.8 5.0 4.0  CL  --  97* 100  CO2  --   --  24  GLUCOSE 102* 94 116*  BUN  --  101* 58*  CREATININE  --  16.00* 10.46*  CALCIUM  --   --  7.8*   Liver Function Tests: No results for input(s): AST, ALT, ALKPHOS, BILITOT, PROT, ALBUMIN in the last 168 hours. No results for input(s): LIPASE, AMYLASE in the last 168 hours. No results for input(s): AMMONIA in the last 168  hours. CBC: Recent Labs  Lab 10/26/18 0811 10/26/18 0815 10/27/18 0521  WBC 4.9  --  4.3  NEUTROABS 2.8  --   --   HGB 11.6* 13.6 11.2*  HCT 37.3* 40.0 35.0*  MCV 97.9  --  96.2  PLT 187  --  148*   Cardiac Enzymes: No results for input(s): CKTOTAL, CKMB, CKMBINDEX, TROPONINI in the last 168 hours. CBG: No results for input(s): GLUCAP in the last 168 hours.  Studies/Results: No results found. Medications: . sodium chloride 10 mL/hr at 10/27/18 1040   . carvedilol  25 mg Oral BID  . Chlorhexidine Gluconate Cloth  6 each Topical Q0600  . Chlorhexidine Gluconate Cloth  6 each Topical Q0600  . heparin  5,000 Units Dialysis Once in dialysis  . isosorbide mononitrate  60 mg Oral Daily

## 2018-10-28 NOTE — Discharge Summary (Signed)
Name: Tidus Upchurch MRN: [id number] DOB: [birth year] 52 y.o. PCP: Jean Rosenthal, MD  Date of Admission: 10/26/2018  8:03 AM Date of Discharge: 10/28/2018 Attending Physician: Axel Filler, *  Discharge Diagnosis: 1. Acute on chronic combined systolic and diastolic CHF 2. ESRD on HD  Discharge Medications: Allergies as of 10/28/2018      Reactions   Losartan Cough      Medication List    TAKE these medications   calcium acetate 667 MG capsule Commonly known as:  PHOSLO Take 3 capsules (2,001 mg total) by mouth 3 (three) times daily with meals. What changed:    how much to take  when to take this  additional instructions   carvedilol 25 MG tablet Commonly known as:  COREG Take 1 tablet (25 mg total) by mouth 2 (two) times daily.   clonazePAM 0.5 MG tablet Commonly known as:  KLONOPIN Take 1 tablet (0.5 mg total) by mouth as needed. What changed:  when to take this   HYDROcodone-acetaminophen 5-325 MG tablet Commonly known as:  NORCO/VICODIN Take 1 tablet by mouth every 6 (six) hours as needed for moderate pain.   isosorbide mononitrate 60 MG 24 hr tablet Commonly known as:  IMDUR Take 1 tablet (60 mg total) by mouth daily.       Disposition and follow-up:   Mr.Izaah Nienhuis was discharged from [facility] in Stable condition.  At the hospital follow up visit please address:  1.  ESRD on HD - please assess patient's compliance with HD   2.  Labs / imaging needed at time of follow-up: none  3.  Pending labs/ test needing follow-up: none  Follow-up Appointments: Follow-up Information    Waynetta Sandy, MD Follow up in 3 week(s).   Specialties:  Vascular Surgery, Cardiology Contact information: [place] [address] [phone]        Jean Rosenthal, MD .   Specialty:  Internal Medicine Contact information: [address] [place] [address] [phone]        [place] .   Contact information: [address] [place] [facility] Course by problem list: 1. Volume overload secondary to missed HD, Combined diastolic and systolic HF- patient presented to short stay for elective fistula revision.  He reports missing dialysis for the past week due to being out of town on vacation. He reports shortness of breath and inability to lay flat.  From short stay he was sent to the ED for further evaluation and need for hemodialysis.  Nephrology was consulted in the ED for hemodialysis. After receiving HD he had his AV fistula repaired the following morning and another HD session following the procedure using the AV fistula. He was discharged with instructions to continue HD on Friday and MWF onward. He will follow up with vascular surgery in ~3 weeks for staple removal.  Discharge Vitals:   BP 127/78 (BP Location: Left Arm)   Pulse (!) 58   Temp 98.3 F (36.8 C) (Oral)   Resp 20   Ht 5' 9.02" (1.753 m)   Wt 79.5 kg Comment: bed scale  SpO2 98%   BMI 25.87 kg/m   Pertinent Labs, Studies, and Procedures:   CBC Latest Ref Rng & Units 10/27/2018 10/26/2018 10/26/2018  WBC 4.0 - 10.5 K/uL 4.3 - 4.9  Hemoglobin 13.0 - 17.0 g/dL 11.2(L)  13.6 11.6(L)  Hematocrit 39.0 - 52.0 % 35.0(L) 40.0 37.3(L)  Platelets 150 - 400 K/uL 148(L) - 187   CMP Latest Ref Rng & Units 10/27/2018 10/26/2018 10/26/2018  Glucose 70 - 99 mg/dL 116(H) 94 102(H)  BUN 6 - 20 mg/dL 58(H) 101(H) -  Creatinine 0.61 - 1.24 mg/dL 10.46(H) 16.00(H) -  Sodium 135 - 145 mmol/L 138 136 137  Potassium 3.5 - 5.1 mmol/L 4.0 5.0 4.8  Chloride 98 - 111 mmol/L 100 97(L) -  CO2 22 - 32 mmol/L 24 - -  Calcium 8.9 - 10.3 mg/dL 7.8(L) - -  Total Protein 6.0 - 8.5 g/dL - - -  Total Bilirubin 0.0 - 1.2 mg/dL - - -  Alkaline Phos 39 - 117 IU/L - - -  AST 0 - 40 IU/L - - -  ALT 0 - 44 IU/L - - -     Discharge Instructions: Discharge  Instructions    Diet - low sodium heart healthy   Complete by:  As directed    Discharge instructions   Complete by:  As directed    Mr. Bartolucci,  Please take Norco 5-325 mg 1 tablet every six hours as needed for moderate to severe pain. Resume all your other medications as prescribed and your MWF Hemodialysis. Follow up with vascular surgery in 3 weeks and with your PCP on 11/22.   Increase activity slowly   Complete by:  As directed       Signed: Darryle Dennie N, DO 10/28/2018, 11:40 AM   Pager: [phone]

## 2018-10-28 NOTE — Telephone Encounter (Signed)
-----   Message from Mena Goes, RN sent at 10/27/2018  1:50 PM EST ----- Regarding: 3 weeks for staple removal in PA clinic   ----- Message ----- From: Iline Oven Sent: 10/27/2018  12:39 PM EST To: [facility] Admin Pool, [facility] Charge Pool   Can you schedule an appt for this pt in 3 weeks for staple removal on [facility] clinic.  PO L arm fistula plication. Thanks, [facility]

## 2018-10-28 NOTE — Progress Notes (Signed)
Internal Medicine Attending:   I saw and examined the patient. I reviewed the resident's note and I agree with the resident's findings and plan as documented in the resident's note.  Principal Problem:   Acute on chronic combined systolic and diastolic CHF (congestive heart failure) (HCC) Active Problems:   ESRD needing dialysis ([place])  Clinically stable. Please see Dr. Olevia Perches progress note for today's plan.  Lalla Brothers, MD

## 2018-10-28 NOTE — Anesthesia Postprocedure Evaluation (Signed)
Anesthesia Post Note  Patient: [name] [name]  Procedure(s) Performed: REVISION PLICATION OF ARTERIOVENOUS FISTULA  LEFT UPPER ARM (Left Arm Upper)     Patient location during evaluation: PACU Anesthesia Type: General Level of consciousness: awake and alert Pain management: pain level controlled Vital Signs Assessment: post-procedure vital signs reviewed and stable Respiratory status: spontaneous breathing, nonlabored ventilation and respiratory function stable Cardiovascular status: blood pressure returned to baseline and stable Postop Assessment: no apparent nausea or vomiting Anesthetic complications: no    Last Vitals:  Vitals:   10/27/18 2147 10/28/18 0516  BP: 106/60 127/78  Pulse: (!) 58 (!) 58  Resp: 17 20  Temp: 36.9 C 36.8 C  SpO2: 100% 98%    Last Pain:  Vitals:   10/28/18 0939  TempSrc:   PainSc: 8                  Lidia Collum

## 2018-10-28 NOTE — Progress Notes (Signed)
[name] [name] to be D/C'd Home per MD order.  Discussed prescriptions and follow up appointments with the patient. Prescriptions given to patient, medication list explained in detail. Pt verbalized understanding.  Allergies as of 10/28/2018      Reactions   Losartan Cough      Medication List    TAKE these medications   calcium acetate 667 MG capsule Commonly known as:  PHOSLO Take 3 capsules (2,001 mg total) by mouth 3 (three) times daily with meals. What changed:    how much to take  when to take this  additional instructions   carvedilol 25 MG tablet Commonly known as:  COREG Take 1 tablet (25 mg total) by mouth 2 (two) times daily.   clonazePAM 0.5 MG tablet Commonly known as:  KLONOPIN Take 1 tablet (0.5 mg total) by mouth as needed. What changed:  when to take this   HYDROcodone-acetaminophen 5-325 MG tablet Commonly known as:  NORCO/VICODIN Take 1 tablet by mouth every 6 (six) hours as needed for moderate pain.   isosorbide mononitrate 60 MG 24 hr tablet Commonly known as:  IMDUR Take 1 tablet (60 mg total) by mouth daily.       Vitals:   10/27/18 2147 10/28/18 0516  BP: 106/60 127/78  Pulse: (!) 58 (!) 58  Resp: 17 20  Temp: 98.4 F (36.9 C) 98.3 F (36.8 C)  SpO2: 100% 98%    IV catheter discontinued intact. Site without signs and symptoms of complications. Dressing and pressure applied. Pt denies pain at this time. No complaints noted.  An After Visit Summary was printed and given to the patient. Patient escorted via [place], and D/C home via private auto.

## 2018-10-28 NOTE — Telephone Encounter (Signed)
sch appt lvm 11/19/18 3pm staple removal

## 2018-10-28 NOTE — Progress Notes (Addendum)
  Progress Note    10/28/2018 9:15 AM 1 Day Post-Op  Subjective:  Says HD went well last night; soreness at incision of arm   Vitals:   10/27/18 2147 10/28/18 0516  BP: 106/60 127/78  Pulse: (!) 58 (!) 58  Resp: 17 20  Temp: 98.4 F (36.9 C) 98.3 F (36.8 C)  SpO2: 100% 98%   Physical Exam: Lungs:  Non labored Heart: bradycardic Incisions:  L arm incision without palpable hematoma or continued bleeding; skin edges approximated well with staples; soft thrill with compression of fistula at shoulder Extremities:  Palpable L radial pulse Abdomen:  Soft Neurologic: A&O  CBC    Component Value Date/Time   WBC 4.3 10/27/2018 0521   RBC 3.64 (L) 10/27/2018 0521   HGB 11.2 (L) 10/27/2018 0521   HGB 12.3 (L) 12/31/2017 1012   HCT 35.0 (L) 10/27/2018 0521   HCT 37.4 (L) 12/31/2017 1012   PLT 148 (L) 10/27/2018 0521   PLT 176 12/31/2017 1012   MCV 96.2 10/27/2018 0521   MCV 99 (H) 12/31/2017 1012   MCH 30.8 10/27/2018 0521   MCHC 32.0 10/27/2018 0521   RDW 15.7 (H) 10/27/2018 0521   RDW 18.8 (H) 12/31/2017 1012   LYMPHSABS 1.2 10/26/2018 0811   LYMPHSABS 1.0 12/31/2017 1012   MONOABS 0.7 10/26/2018 0811   EOSABS 0.2 10/26/2018 0811   EOSABS 0.1 12/31/2017 1012   BASOSABS 0.1 10/26/2018 0811   BASOSABS 0.0 12/31/2017 1012    BMET    Component Value Date/Time   NA 138 10/27/2018 0521   K 4.0 10/27/2018 0521   CL 100 10/27/2018 0521   CO2 24 10/27/2018 0521   GLUCOSE 116 (H) 10/27/2018 0521   BUN 58 (H) 10/27/2018 0521   CREATININE 10.46 (H) 10/27/2018 0521   CALCIUM 7.8 (L) 10/27/2018 0521   CALCIUM 8.2 (L) 10/21/2010 1420   GFRNONAA 5 (L) 10/27/2018 0521   GFRAA 6 (L) 10/27/2018 0521    INR    Component Value Date/Time   INR 1.36 10/27/2018 0521     Intake/Output Summary (Last 24 hours) at 10/28/2018 0915 Last data filed at 10/28/2018 0601 Gross per 24 hour  Intake 1085.17 ml  Output 2538 ml  Net -1452.83 ml     Assessment/Plan:  52 y.o. male  is s/p revision with plication of left arm fistula 1 Day Post-Op   Patent fistula with soft thrill Ok for discharge from vascular standpoint if fistula functioning well on HD Office will arrange follow up for staple removal in about 3 weeks   Dagoberto Ligas, PA-C [facility] [phone] 10/28/2018 9:15 AM   I have independently interviewed and examine patient and agree with PA assessment and plan above.   Nevelyn Mellott C. Donzetta Matters, MD [facility] [place] Office: [phone] Pager: [phone]

## 2018-10-29 DIAGNOSIS — D509 Iron deficiency anemia, unspecified: Secondary | ICD-10-CM | POA: Diagnosis not present

## 2018-10-29 DIAGNOSIS — N186 End stage renal disease: Secondary | ICD-10-CM | POA: Diagnosis not present

## 2018-10-29 DIAGNOSIS — Z23 Encounter for immunization: Secondary | ICD-10-CM | POA: Diagnosis not present

## 2018-10-29 DIAGNOSIS — N2581 Secondary hyperparathyroidism of renal origin: Secondary | ICD-10-CM | POA: Diagnosis not present

## 2018-11-01 DIAGNOSIS — Z23 Encounter for immunization: Secondary | ICD-10-CM | POA: Diagnosis not present

## 2018-11-01 DIAGNOSIS — D509 Iron deficiency anemia, unspecified: Secondary | ICD-10-CM | POA: Diagnosis not present

## 2018-11-01 DIAGNOSIS — N2581 Secondary hyperparathyroidism of renal origin: Secondary | ICD-10-CM | POA: Diagnosis not present

## 2018-11-01 DIAGNOSIS — N186 End stage renal disease: Secondary | ICD-10-CM | POA: Diagnosis not present

## 2018-11-01 NOTE — Progress Notes (Deleted)
Cardiology Office Note:    Date:  11/02/2018   ID:  [name] [name], DOB Aug 30, 1966, MRN [id number]  PCP:  [name] Rosenthal, MD  Cardiologist:  [name] Him, MD *** Electrophysiologist:  None   Referring MD: [name] Rosenthal, MD   No chief complaint on file. ***  History of Present Illness:    [name] [name] is a 52 y.o. male with HTN, ESRD on HD, polysubstance abuse including cocaine and ETOH, combined systolic and diastolic CHF, secondary hyperparathyroidism, anemia, VT in the setting of cocaine and HTN urgency in 2011, medical non-adherence. Prior EF was 40-45% in 2013.   He was admitted in 02/2016 with volume overload in the setting of 2 missed dialysis sessions requiring emergent dialysis. Echo demonstrated EF 08-67%, mod diastolic dysfunction, PASP 55 mmHg. He was noted to have NSVT on Tele in the hospital. Cardiac catheterization demonstrated normal coronary arteries. He was seen by electrophysiology. Continued medical therapy was recommended.   He has not been seen in clinic since 2017.  He has been admitted several times for decompensated congestive heart failure in the setting of missed dialysis sessions.  He was most recently admitted 11/12-11/14 for volume overload.    [name] [name] ***  Prior CV studies:   The following studies were reviewed today:  *** LHC 02/25/16 Normal coronary arteries PASP 65 mmHg LVEDP 45 mmHg 1. No angiographic evidence of CAD 2. Moderate pulmonary HTN 3. Elevated LV filling pressures.   Echo 02/21/16 Mild LVH, EF 61-95%, grade 2 diastolic dysfunction, mild MR, severe LAE, moderately reduced RVSF, severe RAE, mild TR, moderate PI, PASP 55 mmHg  Echo 8/13 Severe LVH, EF 40-45%, inferoposterior AK, grade 2 diastolic dysfunction, moderate LAE, mild RVE, mildly reduced RVSF, mild RAE   Past Medical History:  Diagnosis Date  . Abdominal distension 08/10/2018  . Alcohol abuse 11/26/2010   Qualifier: Diagnosis of  By: [name] MD, Benjamine Mola      . Anemia due to blood loss, acute 07/15/2012  . CHF (congestive heart failure) ([place])   . Cholecystitis 04/18/2014  . Cigarette smoker 11/26/2010   Qualifier: Diagnosis of  By: [name] MD, Benjamine Mola    . Cocaine abuse ([place]) 11/26/2010   Qualifier: Diagnosis of  By: [name] MD, Benjamine Mola    . Combined congestive systolic and diastolic heart failure ([place]) 04/18/2014   A. Echo 8/13: Severe LVH, EF 40-45%, inferoposterior akinesis, grade 2 diastolic dysfunction, moderate LAE, mild RVE, mildly reduced RVSF, mild RAE; cannot rule out R atrial mass-suggest TEE or cardiac MRI  //  B. Echo 3/17: Mild LVH, EF 25-30%, diffuse HK, grade 2 diastolic dysfunction, mild MR, severe LAE,  moderately reduced RVSF, severe RAE, mild TR, moderate PI, PASP 55 mmHg    . Dyspnea   . ESRD (end stage renal disease) on dialysis [facility])    MWF- [place] (05/18/2017)  . Hemodialysis patient ([place])    M,W,F  . Hernia, inguinal, right 08/05/2016  . Hypertension   . 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 Hypocalcemia 07/15/2012  . Medical non-compliance   . Non-ischemic cardiomyopathy ([place])    A. R/L HC 3/17: Normal coronary arteries, moderate pulmonary hypertension (PASP 65 mmHg), elevated LV filling pressures (LVEDP 45 mmHg)   . NSVT (nonsustained ventricular tachycardia) ([place]) 02/22/2016  . NSVT (nonsustained ventricular tachycardia) ([place]) 02/22/2016  . Pneumonia 11/12/2017  . Polysubstance abuse ([place])   .  Prolonged Q-T interval on ECG 05/18/2017  . Prolonged QT interval 05/18/2017  . Restless leg syndrome   . Solitary pulmonary nodule 10/10/2015   See cxr 10/09/2015 - CT rec 10/10/2015 >>>   . Upper airway cough syndrome 10/09/2015   Off ACEi around 1st Oct 2016  - Sinus CT 10/10/2015 >>>     Surgical Hx: The patient  has a past surgical history that includes Insertion of dialysis catheter (07/17/2012);  Bascilic vein transposition (01/19/2013); venogram (N/A, 08/09/2012); Cardiac catheterization (N/A, 02/25/2016); Inguinal hernia repair (Right, 08/05/2016); Umbilical hernia repair (N/A, 08/05/2016); Insertion of mesh (Right, 08/05/2016); Revison of arteriovenous fistula (Left, 01/06/2017); and Revison of arteriovenous fistula (Left, 10/27/2018).   Current Medications: No outpatient medications have been marked as taking for the 11/02/18 encounter (Appointment) with Richardson Dopp T, PA-C.     Allergies:   Losartan   Social History   Tobacco Use  . Smoking status: Current Every Day Smoker    Packs/day: 0.10    Years: 30.00    Pack years: 3.00    Types: Cigarettes  . Smokeless tobacco: Never Used  . Tobacco comment: Cutting back   Substance Use Topics  . Alcohol use: Yes    Alcohol/week: 6.0 standard drinks    Types: 6 Cans of beer per week  . Drug use: No    Types: Marijuana    Comment: 05/18/2017 "qd"     Family Hx: The patient's family history includes Diabetes in his mother; Hypertension in his father and mother; Renal Disease in his mother.  ROS:   Please see the history of present illness.    ROS All other systems reviewed and are negative.   EKGs/Labs/Other Test Reviewed:    EKG:  EKG is *** ordered today.  The ekg ordered today demonstrates ***  Recent Labs: 11/13/2017: Magnesium 2.5 09/17/2018: ALT 6 10/27/2018: BUN 58; Creatinine, Ser 10.46; Hemoglobin 11.2; Platelets 148; Potassium 4.0; Sodium 138   Recent Lipid Panel Lab Results  Component Value Date/Time   CHOL 62 05/19/2017 06:59 AM   TRIG 56 05/19/2017 06:59 AM   HDL 29 (L) 05/19/2017 06:59 AM   CHOLHDL 2.1 05/19/2017 06:59 AM   LDLCALC 22 05/19/2017 06:59 AM    Physical Exam:    VS:  There were no vitals taken for this visit.    Wt Readings from Last 3 Encounters:  10/27/18 175 lb 4.3 oz (79.5 kg)  10/26/18 180 lb (81.6 kg)  10/22/18 180 lb (81.6 kg)     ***Physical Exam  ASSESSMENT & PLAN:     Chronic combined systolic and diastolic heart failure (HCC)  ESRD needing dialysis ([place])  Essential hypertension  NSVT (nonsustained ventricular tachycardia) (HCC)*** 1. Surgical clearance - Patient needs R inguinal hernia repair.  He has a hx of non-ischemic cardiomyopathy, combined systolic and diastolic CHF and VT.  LHC in 3/17 demonstrated no CAD.  He is dependent on dialysis for volume management.  He has a hx of non-adherence. However, he notes compliance with dialysis.  Given his comorbid illnesses, he is mod risk at best for any procedure requiring gen anesthesia.  I reviewed his case today with Dr. Radford Pax.  He is ok to proceed as long as he and his surgeon are agreeable to the risk. He will need dialysis at the time of his procedure.    2. Combined systolic and diastolic CHF - Volume management per dialysis.  Continue beta blocker, nitrates. Will try to resume ARB.  I have asked [name] to  clear this with nephrology first.             -  If ok with renal, start Losartan 25 mg QD  3. NICM - Likely related to HTN +/- substance abuse.  His LHC in 3/17 was neg for CAD.  Continue beta blocker, nitrates. Try to resume ARB.  Eventually plan repeat echo in next few mos.  If EF remains < 35%, refer back to Dr. Caryl Comes for consideration of ICD.  4. ESRD - He goes to dialysis on M, W, F.  5. HTN - Controlled.   6. NSVT - Patient was seen by EP in the hospital earlier this year (Dr. Virl Axe). ICD implant was felt to be complicated given ESRD, substance abuse and non-adherence.  As his DCM is non-ischemic, it puts [name] in a different risk category and therefore, medical Rx was planned.  Continue beta blocker.   Dispo:  No follow-ups on file.   Medication Adjustments/Labs and Tests Ordered: Current medicines are reviewed at length with the patient today.  Concerns regarding medicines are outlined above.  Tests Ordered: No orders of the defined types were placed in this  encounter.  Medication Changes: No orders of the defined types were placed in this encounter.   Signed, Richardson Dopp, PA-C  11/02/2018 7:59 AM    [facility] [place], [place], [place]  [address] Phone: [phone]; Fax: [phone]

## 2018-11-02 ENCOUNTER — Ambulatory Visit: Payer: Medicare Other | Admitting: Physician Assistant

## 2018-11-03 ENCOUNTER — Ambulatory Visit: Payer: Medicare Other | Admitting: Physician Assistant

## 2018-11-03 ENCOUNTER — Encounter: Payer: Self-pay | Admitting: Physician Assistant

## 2018-11-03 DIAGNOSIS — I428 Other cardiomyopathies: Secondary | ICD-10-CM | POA: Insufficient documentation

## 2018-11-03 DIAGNOSIS — I10 Essential (primary) hypertension: Secondary | ICD-10-CM | POA: Insufficient documentation

## 2018-11-03 NOTE — Progress Notes (Deleted)
Cardiology Office Note:    Date:  11/03/2018   ID:  [name] [name], DOB March 28, 1966, MRN [id number]  PCP:  Jean Rosenthal, MD  Cardiologist:  Fransico Him, MD *** Electrophysiologist:  None   Referring MD: Jean Rosenthal, MD   No chief complaint on file. ***  History of Present Illness:    [name] [name] is a 52 y.o. male with HTN, ESRD on HD, polysubstance abuse including cocaine and ETOH, combined systolic and diastolic CHF, secondary hyperparathyroidism, anemia, VT in the setting of cocaine and HTN urgency in 2011, medical non-adherence. Prior EF was 40-45% in 2013.   He was admitted in 02/2016 with volume overload in the setting of 2 missed dialysis sessions requiring emergent dialysis. Echo demonstrated EF 18-29%, mod diastolic dysfunction, PASP 55 mmHg. He was noted to have NSVT on Tele in the hospital. Cardiac catheterization demonstrated normal coronary arteries. He was seen by electrophysiology. Continued medical therapy was recommended.   He has not been seen in clinic since 2017.  He has been admitted several times for decompensated congestive heart failure in the setting of missed dialysis sessions.  He was most recently admitted 11/12-11/14 for volume overload.    [name] [name] ***  Prior CV studies:   The following studies were reviewed today:  *** LHC 02/25/16 Normal coronary arteries PASP 65 mmHg LVEDP 45 mmHg 1. No angiographic evidence of CAD 2. Moderate pulmonary HTN 3. Elevated LV filling pressures.   Echo 02/21/16 Mild LVH, EF 93-71%, grade 2 diastolic dysfunction, mild MR, severe LAE, moderately reduced RVSF, severe RAE, mild TR, moderate PI, PASP 55 mmHg  Echo 8/13 Severe LVH, EF 40-45%, inferoposterior AK, grade 2 diastolic dysfunction, moderate LAE, mild RVE, mildly reduced RVSF, mild RAE   Past Medical History:  Diagnosis Date  . Abdominal distension 08/10/2018  . Alcohol abuse 11/26/2010   Qualifier: Diagnosis of  By: [name] MD, Benjamine Mola      . Anemia due to blood loss, acute 07/15/2012  . CHF (congestive heart failure) ([place])   . Cholecystitis 04/18/2014  . Cigarette smoker 11/26/2010   Qualifier: Diagnosis of  By: [name] MD, Benjamine Mola    . Cocaine abuse ([place]) 11/26/2010   Qualifier: Diagnosis of  By: [name] MD, Benjamine Mola    . Combined congestive systolic and diastolic heart failure ([place]) 04/18/2014   A. Echo 8/13: Severe LVH, EF 40-45%, inferoposterior akinesis, grade 2 diastolic dysfunction, moderate LAE, mild RVE, mildly reduced RVSF, mild RAE; cannot rule out R atrial mass-suggest TEE or cardiac MRI  //  B. Echo 3/17: Mild LVH, EF 25-30%, diffuse HK, grade 2 diastolic dysfunction, mild MR, severe LAE,  moderately reduced RVSF, severe RAE, mild TR, moderate PI, PASP 55 mmHg    . Dyspnea   . ESRD (end stage renal disease) on dialysis [facility])    MWF- [place] (05/18/2017)  . Hemodialysis patient ([place])    M,W,F  . Hernia, inguinal, right 08/05/2016  . Hypertension   . 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 Hypocalcemia 07/15/2012  . Medical non-compliance   . Non-ischemic cardiomyopathy ([place])    A. R/L HC 3/17: Normal coronary arteries, moderate pulmonary hypertension (PASP 65 mmHg), elevated LV filling pressures (LVEDP 45 mmHg)   . NSVT (nonsustained ventricular tachycardia) ([place]) 02/22/2016  . NSVT (nonsustained ventricular tachycardia) ([place]) 02/22/2016  . Pneumonia 11/12/2017  . Polysubstance abuse ([place])   .  Prolonged Q-T interval on ECG 05/18/2017  . Prolonged QT interval 05/18/2017  . Restless leg syndrome   . Solitary pulmonary nodule 10/10/2015   See cxr 10/09/2015 - CT rec 10/10/2015 >>>   . Upper airway cough syndrome 10/09/2015   Off ACEi around 1st Oct 2016  - Sinus CT 10/10/2015 >>>     Surgical Hx: The patient  has a past surgical history that includes Insertion of dialysis catheter (07/17/2012);  Bascilic vein transposition (01/19/2013); venogram (N/A, 08/09/2012); Cardiac catheterization (N/A, 02/25/2016); Inguinal hernia repair (Right, 08/05/2016); Umbilical hernia repair (N/A, 08/05/2016); Insertion of mesh (Right, 08/05/2016); Revison of arteriovenous fistula (Left, 01/06/2017); and Revison of arteriovenous fistula (Left, 10/27/2018).   Current Medications: No outpatient medications have been marked as taking for the 11/03/18 encounter (Appointment) with Richardson Dopp T, PA-C.     Allergies:   Losartan   Social History   Tobacco Use  . Smoking status: Current Every Day Smoker    Packs/day: 0.10    Years: 30.00    Pack years: 3.00    Types: Cigarettes  . Smokeless tobacco: Never Used  . Tobacco comment: Cutting back   Substance Use Topics  . Alcohol use: Yes    Alcohol/week: 6.0 standard drinks    Types: 6 Cans of beer per week  . Drug use: No    Types: Marijuana    Comment: 05/18/2017 "qd"     Family Hx: The patient's family history includes Diabetes in his mother; Hypertension in his father and mother; Renal Disease in his mother.  ROS:   Please see the history of present illness.    ROS All other systems reviewed and are negative.   EKGs/Labs/Other Test Reviewed:    EKG:  EKG is *** ordered today.  The ekg ordered today demonstrates ***  Recent Labs: 11/13/2017: Magnesium 2.5 09/17/2018: ALT 6 10/27/2018: BUN 58; Creatinine, Ser 10.46; Hemoglobin 11.2; Platelets 148; Potassium 4.0; Sodium 138   Recent Lipid Panel Lab Results  Component Value Date/Time   CHOL 62 05/19/2017 06:59 AM   TRIG 56 05/19/2017 06:59 AM   HDL 29 (L) 05/19/2017 06:59 AM   CHOLHDL 2.1 05/19/2017 06:59 AM   LDLCALC 22 05/19/2017 06:59 AM    Physical Exam:    VS:  There were no vitals taken for this visit.    Wt Readings from Last 3 Encounters:  10/27/18 175 lb 4.3 oz (79.5 kg)  10/26/18 180 lb (81.6 kg)  10/22/18 180 lb (81.6 kg)     ***Physical Exam  ASSESSMENT & PLAN:     Chronic combined systolic and diastolic heart failure (HCC)  NICM (nonischemic cardiomyopathy) (HCC)  NSVT (nonsustained ventricular tachycardia) (HCC)  ESRD (end stage renal disease) on dialysis [facility])  Essential hypertension*** 1. Surgical clearance - Patient needs R inguinal hernia repair.  He has a hx of non-ischemic cardiomyopathy, combined systolic and diastolic CHF and VT.  LHC in 3/17 demonstrated no CAD.  He is dependent on dialysis for volume management.  He has a hx of non-adherence. However, he notes compliance with dialysis.  Given his comorbid illnesses, he is mod risk at best for any procedure requiring gen anesthesia.  I reviewed his case today with Dr. Radford Pax.  He is ok to proceed as long as he and his surgeon are agreeable to the risk. He will need dialysis at the time of his procedure.    2. Combined systolic and diastolic CHF - Volume management per dialysis.  Continue beta blocker, nitrates. Will try  to resume ARB.  I have asked him to clear this with nephrology first.             -  If ok with renal, start Losartan 25 mg QD  3. NICM - Likely related to HTN +/- substance abuse.  His LHC in 3/17 was neg for CAD.  Continue beta blocker, nitrates. Try to resume ARB.  Eventually plan repeat echo in next few mos.  If EF remains < 35%, refer back to Dr. Caryl Comes for consideration of ICD.  4. ESRD - He goes to dialysis on M, W, F.  5. HTN - Controlled.   6. NSVT - Patient was seen by EP in the hospital earlier this year (Dr. Virl Axe). ICD implant was felt to be complicated given ESRD, substance abuse and non-adherence.  As his DCM is non-ischemic, it puts him in a different risk category and therefore, medical Rx was planned.  Continue beta blocker.   Dispo:  No follow-ups on file.   Medication Adjustments/Labs and Tests Ordered: Current medicines are reviewed at length with the patient today.  Concerns regarding medicines are outlined above.  Tests Ordered: No  orders of the defined types were placed in this encounter.  Medication Changes: No orders of the defined types were placed in this encounter.   Signed, Richardson Dopp, PA-C  11/03/2018 9:02 AM    [facility] [place], [place], [place]  [address] Phone: [phone]; Fax: [phone]

## 2018-11-04 ENCOUNTER — Ambulatory Visit (HOSPITAL_COMMUNITY): Admission: RE | Admit: 2018-11-04 | Payer: Medicare Other | Source: Ambulatory Visit

## 2018-11-05 ENCOUNTER — Ambulatory Visit (INDEPENDENT_AMBULATORY_CARE_PROVIDER_SITE_OTHER): Payer: Medicare Other | Admitting: Internal Medicine

## 2018-11-05 ENCOUNTER — Encounter: Payer: Self-pay | Admitting: Internal Medicine

## 2018-11-05 ENCOUNTER — Other Ambulatory Visit: Payer: Self-pay

## 2018-11-05 VITALS — BP 114/79 | HR 61 | Temp 97.8°F | Ht 69.0 in | Wt 171.9 lb

## 2018-11-05 DIAGNOSIS — Z992 Dependence on renal dialysis: Secondary | ICD-10-CM | POA: Diagnosis not present

## 2018-11-05 DIAGNOSIS — N186 End stage renal disease: Secondary | ICD-10-CM | POA: Diagnosis not present

## 2018-11-05 DIAGNOSIS — R140 Abdominal distension (gaseous): Secondary | ICD-10-CM

## 2018-11-05 DIAGNOSIS — M79602 Pain in left arm: Secondary | ICD-10-CM | POA: Diagnosis not present

## 2018-11-05 DIAGNOSIS — Z23 Encounter for immunization: Secondary | ICD-10-CM | POA: Diagnosis not present

## 2018-11-05 DIAGNOSIS — F17210 Nicotine dependence, cigarettes, uncomplicated: Secondary | ICD-10-CM

## 2018-11-05 DIAGNOSIS — N2581 Secondary hyperparathyroidism of renal origin: Secondary | ICD-10-CM | POA: Diagnosis not present

## 2018-11-05 DIAGNOSIS — D509 Iron deficiency anemia, unspecified: Secondary | ICD-10-CM | POA: Diagnosis not present

## 2018-11-05 DIAGNOSIS — I82611 Acute embolism and thrombosis of superficial veins of right upper extremity: Secondary | ICD-10-CM | POA: Insufficient documentation

## 2018-11-05 MED ORDER — HYDROCODONE-ACETAMINOPHEN 5-325 MG PO TABS: 1.0000 | ORAL_TABLET | Freq: Four times a day (QID) | ORAL | 0 refills | Status: DC | PRN

## 2018-11-05 NOTE — Progress Notes (Signed)
Internal Medicine Clinic Attending  I saw and evaluated the patient.  I personally confirmed the key portions of the history and exam documented by Dr. Agyei and I reviewed pertinent patient test results.  The assessment, diagnosis, and plan were formulated together and I agree with the documentation in the resident's note.  

## 2018-11-05 NOTE — Patient Instructions (Addendum)
[name] [name],  It was a pleasure taking care of you at the clinic.  I am glad you are feeling much better after your recent hospitalization.  For your left arm pain I am going to prescribe you a short course of Norco 5-325 mg hopefully this should give you some relief.  As we discussed, - Please follow-up with the ultrasound results that I place - I also placed an order for you to have an echocardiogram so please follow-up on that.  Dr. Eileen Stanford

## 2018-11-05 NOTE — Assessment & Plan Note (Signed)
Left arm pain: Patient recently underwent revision of left arm fistula for dialysis access and currently complaining of throbbing and constant pain that was initially relieved with a short course of Norco 5-325 mg he was given at discharge.  He reports that he takes 1 pill every 6 hours.  He states that he has tried Tylenol with no improvement and indicates he has given taken up to 7 pills of Tylenol.  He states that he is compliant with hemodialysis and is able to undergo dialysis through the recently revised fistula with no difficulties.      Plan: -Advised patient to stop taking Tylenol for now  (should not exceed more than 4 g/day.) -Given short course of Norco 5-325 mg #12 (on review of the [place] narcotic database, he only refilled Norco 5-325 mg #12 on 10/31/2018)

## 2018-11-05 NOTE — Progress Notes (Signed)
CC: Left arm pain  HPI:  Mr.Charly Febles is a 52 y.o. gentleman with ESRD on hemodialysis Monday, Wednesday, Friday and recent revision of left arm fistula presenting with left arm pain.  He was recently admitted to the hospital from 11/12 to 11/14 with volume overload secondary to missing hemodialysis while on vacation.  During his hospitalization, he underwent hemodialysis and was subsequently discharged once he was stable.  He also underwent revision of his left arm fistula and since then has been having throbbing and constant pain.  Currently rates the pain at 8/10.  Though he has pain, he is able to undergo hemodialysis through the recently revised fistula with no difficulties.  He was discharged with a short course of Norco and reports that it helps relieve the pain.  See problem based charting for further details:  Past Medical History:  Diagnosis Date  . Abdominal distension 08/10/2018  . Alcohol abuse 11/26/2010   Qualifier: Diagnosis of  By: [name] MD, Benjamine Mola    . Anemia due to blood loss, acute 07/15/2012  . CHF (congestive heart failure) ([place])   . Cholecystitis 04/18/2014  . Cigarette smoker 11/26/2010   Qualifier: Diagnosis of  By: [name] MD, Benjamine Mola    . Cocaine abuse ([place]) 11/26/2010   Qualifier: Diagnosis of  By: [name] MD, Benjamine Mola    . 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 Echo 8/13: Severe LVH, EF 40-45%, inferoposterior akinesis, grade 2 diastolic dysfunction, moderate LAE, mild RVE, mildly reduced RVSF, mild RAE; cannot rule out R atrial mass-suggest TEE or cardiac MRI  //  B. Echo 3/17: Mild LVH, EF 25-30%, diffuse HK, grade 2 diastolic dysfunction, mild MR, severe LAE,  moderately reduced RVSF, severe RAE, mild TR, moderate PI, PASP 55 mmHg    . Dyspnea   . ESRD (end stage renal disease) on dialysis [facility])    MWF- [place] (05/18/2017)  . Hemodialysis patient ([place])    M,W,F  . Hernia, inguinal, right 08/05/2016   . Hypertension   . 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 Hypocalcemia 07/15/2012  . Medical non-compliance   . Non-ischemic cardiomyopathy ([place])    A. R/L HC 3/17: Normal coronary arteries, moderate pulmonary hypertension (PASP 65 mmHg), elevated LV filling pressures (LVEDP 45 mmHg)   . NSVT (nonsustained ventricular tachycardia) ([place]) 02/22/2016  . NSVT (nonsustained ventricular tachycardia) ([place]) 02/22/2016  . Pneumonia 11/12/2017  . Polysubstance abuse ([place])   . Prolonged Q-T interval on ECG 05/18/2017  . Prolonged QT interval 05/18/2017  . Restless leg syndrome   . Solitary pulmonary nodule 10/10/2015   See cxr 10/09/2015 - CT rec 10/10/2015 >>>   . Upper airway cough syndrome 10/09/2015   Off ACEi around 1st Oct 2016  - Sinus CT 10/10/2015 >>>     Review of Systems: As per HPI  Physical Exam:  Vitals:   11/05/18 1516  BP: 114/79  Pulse: 61  Temp: 97.8 F (36.6 C)  TempSrc: Oral  SpO2: 100%  Weight: 171 lb 14.4 oz (78 kg)  Height: 5\' 9"  (1.753 m)   Physical Exam  Constitutional: He is well-developed, well-nourished, and in no distress. No distress.  HENT:  Head: Normocephalic and atraumatic.  Eyes: Conjunctivae are normal.  Cardiovascular: Normal rate, regular rhythm and normal heart sounds. Exam reveals no friction rub.  No murmur heard. Pulmonary/Chest: Effort normal  and breath sounds normal. No respiratory distress. He has no wheezes. He has no rales.  Abdominal: Bowel sounds are normal.  Musculoskeletal: Normal range of motion. He exhibits tenderness (Left arm at the site of fistula revision, staples in place). He exhibits no edema or deformity.  Skin: He is not diaphoretic.      Assessment & Plan:   See Encounters Tab for problem based charting.  Patient discussed with Dr. Evette Doffing

## 2018-11-06 LAB — HEPATIC FUNCTION PANEL
ALBUMIN: 4.6 g/dL (ref 3.5–5.5)
ALT: 11 IU/L (ref 0–44)
AST: 17 IU/L (ref 0–40)
Alkaline Phosphatase: 90 IU/L (ref 39–117)
Bilirubin Total: 0.7 mg/dL (ref 0.0–1.2)
Bilirubin, Direct: 0.37 mg/dL (ref 0.00–0.40)
Total Protein: 8 g/dL (ref 6.0–8.5)

## 2018-11-09 DIAGNOSIS — Z23 Encounter for immunization: Secondary | ICD-10-CM | POA: Diagnosis not present

## 2018-11-09 DIAGNOSIS — D509 Iron deficiency anemia, unspecified: Secondary | ICD-10-CM | POA: Diagnosis not present

## 2018-11-09 DIAGNOSIS — N2581 Secondary hyperparathyroidism of renal origin: Secondary | ICD-10-CM | POA: Diagnosis not present

## 2018-11-09 DIAGNOSIS — N186 End stage renal disease: Secondary | ICD-10-CM | POA: Diagnosis not present

## 2018-11-10 ENCOUNTER — Other Ambulatory Visit: Payer: Self-pay

## 2018-11-10 ENCOUNTER — Ambulatory Visit (HOSPITAL_COMMUNITY): Payer: Medicare Other | Attending: Internal Medicine

## 2018-11-10 DIAGNOSIS — I5042 Chronic combined systolic (congestive) and diastolic (congestive) heart failure: Secondary | ICD-10-CM | POA: Insufficient documentation

## 2018-11-12 DIAGNOSIS — N2581 Secondary hyperparathyroidism of renal origin: Secondary | ICD-10-CM | POA: Diagnosis not present

## 2018-11-12 DIAGNOSIS — N186 End stage renal disease: Secondary | ICD-10-CM | POA: Diagnosis not present

## 2018-11-12 DIAGNOSIS — D509 Iron deficiency anemia, unspecified: Secondary | ICD-10-CM | POA: Diagnosis not present

## 2018-11-12 DIAGNOSIS — Z23 Encounter for immunization: Secondary | ICD-10-CM | POA: Diagnosis not present

## 2018-11-14 DIAGNOSIS — Z992 Dependence on renal dialysis: Secondary | ICD-10-CM | POA: Diagnosis not present

## 2018-11-14 DIAGNOSIS — I120 Hypertensive chronic kidney disease with stage 5 chronic kidney disease or end stage renal disease: Secondary | ICD-10-CM | POA: Diagnosis not present

## 2018-11-14 DIAGNOSIS — N186 End stage renal disease: Secondary | ICD-10-CM | POA: Diagnosis not present

## 2018-11-15 DIAGNOSIS — N186 End stage renal disease: Secondary | ICD-10-CM | POA: Diagnosis not present

## 2018-11-15 DIAGNOSIS — D509 Iron deficiency anemia, unspecified: Secondary | ICD-10-CM | POA: Diagnosis not present

## 2018-11-15 DIAGNOSIS — N2581 Secondary hyperparathyroidism of renal origin: Secondary | ICD-10-CM | POA: Diagnosis not present

## 2018-11-19 ENCOUNTER — Ambulatory Visit (INDEPENDENT_AMBULATORY_CARE_PROVIDER_SITE_OTHER): Payer: Self-pay | Admitting: Physician Assistant

## 2018-11-19 ENCOUNTER — Other Ambulatory Visit: Payer: Self-pay

## 2018-11-19 VITALS — BP 100/70 | Temp 97.0°F | Resp 20 | Ht 69.0 in | Wt 181.0 lb

## 2018-11-19 DIAGNOSIS — N186 End stage renal disease: Secondary | ICD-10-CM

## 2018-11-19 DIAGNOSIS — N2581 Secondary hyperparathyroidism of renal origin: Secondary | ICD-10-CM | POA: Diagnosis not present

## 2018-11-19 DIAGNOSIS — Z992 Dependence on renal dialysis: Secondary | ICD-10-CM

## 2018-11-19 DIAGNOSIS — D509 Iron deficiency anemia, unspecified: Secondary | ICD-10-CM | POA: Diagnosis not present

## 2018-11-19 NOTE — Progress Notes (Signed)
  POST OPERATIVE OFFICE NOTE    CC:  F/u for surgery  HPI:  This is a 52 y.o. male who is s/p Revision left arm AV fistula with primary plication on 74/16/3845.  He has HD T-TH-Sat.  He was unable to complete HD yesterday secondary to infiltration.  He will return tomorrow for HD.  He denise loss of sensation, weakness or pain.  Allergies  Allergen Reactions  . Losartan Cough    Current Outpatient Medications  Medication Sig Dispense Refill  . calcium acetate (PHOSLO) 667 MG capsule Take 3 capsules (2,001 mg total) by mouth 3 (three) times daily with meals. (Patient taking differently: Take 1,334-3,335 mg by mouth See admin instructions. Take 3-5 capsules (2001 - 3335 mg) by mouth 3 times daily with large meals, take 2 capsules (1334 mg) with snacks) 90 capsule 0  . carvedilol (COREG) 25 MG tablet Take 1 tablet (25 mg total) by mouth 2 (two) times daily. 60 tablet 0  . clonazePAM (KLONOPIN) 0.5 MG tablet Take 1 tablet (0.5 mg total) by mouth as needed. (Patient taking differently: Take 0.5 mg by mouth every Monday, Wednesday, and Friday with hemodialysis. ) 30 tablet 3  . HYDROcodone-acetaminophen (NORCO) 5-325 MG tablet Take 1 tablet by mouth every 6 (six) hours as needed for moderate pain. 12 tablet 0  . isosorbide mononitrate (IMDUR) 60 MG 24 hr tablet Take 1 tablet (60 mg total) by mouth daily. 30 tablet 0   No current facility-administered medications for this visit.      ROS:  See HPI  Physical Exam:  There were no vitals filed for this visit.  Incision:  Well healed left Upper arm incision.  Extremities:  Grip 5/5, sensation intact, palpable thrill in fistula.  Upper area of localized edema due to infiltration.  Surrounding skin intact without edema.   Assessment/Plan:  This is a 52 y.o. male who is s/p: s/p Revision left arm AV fistula with primary plication on 36/46/8032.    Staples were removed today, patient tolerated this well.  He will return to HD tomorrow.  Don't  stick at new incision site for 8 weeks.  F/U as needed.     Roxy Horseman PA-C [facility] [phone]

## 2018-11-22 DIAGNOSIS — N2581 Secondary hyperparathyroidism of renal origin: Secondary | ICD-10-CM | POA: Diagnosis not present

## 2018-11-22 DIAGNOSIS — N186 End stage renal disease: Secondary | ICD-10-CM | POA: Diagnosis not present

## 2018-11-22 DIAGNOSIS — D509 Iron deficiency anemia, unspecified: Secondary | ICD-10-CM | POA: Diagnosis not present

## 2018-11-24 DIAGNOSIS — D509 Iron deficiency anemia, unspecified: Secondary | ICD-10-CM | POA: Diagnosis not present

## 2018-11-24 DIAGNOSIS — N2581 Secondary hyperparathyroidism of renal origin: Secondary | ICD-10-CM | POA: Diagnosis not present

## 2018-11-24 DIAGNOSIS — N186 End stage renal disease: Secondary | ICD-10-CM | POA: Diagnosis not present

## 2018-11-26 DIAGNOSIS — N186 End stage renal disease: Secondary | ICD-10-CM | POA: Diagnosis not present

## 2018-11-26 DIAGNOSIS — D509 Iron deficiency anemia, unspecified: Secondary | ICD-10-CM | POA: Diagnosis not present

## 2018-11-26 DIAGNOSIS — N2581 Secondary hyperparathyroidism of renal origin: Secondary | ICD-10-CM | POA: Diagnosis not present

## 2018-11-29 DIAGNOSIS — D509 Iron deficiency anemia, unspecified: Secondary | ICD-10-CM | POA: Diagnosis not present

## 2018-11-29 DIAGNOSIS — N2581 Secondary hyperparathyroidism of renal origin: Secondary | ICD-10-CM | POA: Diagnosis not present

## 2018-11-29 DIAGNOSIS — N186 End stage renal disease: Secondary | ICD-10-CM | POA: Diagnosis not present

## 2018-12-01 DIAGNOSIS — N186 End stage renal disease: Secondary | ICD-10-CM | POA: Diagnosis not present

## 2018-12-01 DIAGNOSIS — N2581 Secondary hyperparathyroidism of renal origin: Secondary | ICD-10-CM | POA: Diagnosis not present

## 2018-12-01 DIAGNOSIS — D509 Iron deficiency anemia, unspecified: Secondary | ICD-10-CM | POA: Diagnosis not present

## 2018-12-03 DIAGNOSIS — N2581 Secondary hyperparathyroidism of renal origin: Secondary | ICD-10-CM | POA: Diagnosis not present

## 2018-12-03 DIAGNOSIS — D509 Iron deficiency anemia, unspecified: Secondary | ICD-10-CM | POA: Diagnosis not present

## 2018-12-03 DIAGNOSIS — N186 End stage renal disease: Secondary | ICD-10-CM | POA: Diagnosis not present

## 2018-12-05 DIAGNOSIS — N2581 Secondary hyperparathyroidism of renal origin: Secondary | ICD-10-CM | POA: Diagnosis not present

## 2018-12-05 DIAGNOSIS — D509 Iron deficiency anemia, unspecified: Secondary | ICD-10-CM | POA: Diagnosis not present

## 2018-12-05 DIAGNOSIS — N186 End stage renal disease: Secondary | ICD-10-CM | POA: Diagnosis not present

## 2018-12-07 DIAGNOSIS — N2581 Secondary hyperparathyroidism of renal origin: Secondary | ICD-10-CM | POA: Diagnosis not present

## 2018-12-07 DIAGNOSIS — N186 End stage renal disease: Secondary | ICD-10-CM | POA: Diagnosis not present

## 2018-12-07 DIAGNOSIS — D509 Iron deficiency anemia, unspecified: Secondary | ICD-10-CM | POA: Diagnosis not present

## 2018-12-10 DIAGNOSIS — N186 End stage renal disease: Secondary | ICD-10-CM | POA: Diagnosis not present

## 2018-12-10 DIAGNOSIS — N2581 Secondary hyperparathyroidism of renal origin: Secondary | ICD-10-CM | POA: Diagnosis not present

## 2018-12-10 DIAGNOSIS — D509 Iron deficiency anemia, unspecified: Secondary | ICD-10-CM | POA: Diagnosis not present

## 2018-12-14 DIAGNOSIS — N2581 Secondary hyperparathyroidism of renal origin: Secondary | ICD-10-CM | POA: Diagnosis not present

## 2018-12-14 DIAGNOSIS — N186 End stage renal disease: Secondary | ICD-10-CM | POA: Diagnosis not present

## 2018-12-14 DIAGNOSIS — D509 Iron deficiency anemia, unspecified: Secondary | ICD-10-CM | POA: Diagnosis not present

## 2018-12-15 DIAGNOSIS — Z992 Dependence on renal dialysis: Secondary | ICD-10-CM | POA: Diagnosis not present

## 2018-12-15 DIAGNOSIS — I120 Hypertensive chronic kidney disease with stage 5 chronic kidney disease or end stage renal disease: Secondary | ICD-10-CM | POA: Diagnosis not present

## 2018-12-15 DIAGNOSIS — N186 End stage renal disease: Secondary | ICD-10-CM | POA: Diagnosis not present

## 2018-12-17 DIAGNOSIS — N186 End stage renal disease: Secondary | ICD-10-CM | POA: Diagnosis not present

## 2018-12-17 DIAGNOSIS — D509 Iron deficiency anemia, unspecified: Secondary | ICD-10-CM | POA: Diagnosis not present

## 2018-12-17 DIAGNOSIS — N2581 Secondary hyperparathyroidism of renal origin: Secondary | ICD-10-CM | POA: Diagnosis not present

## 2018-12-20 DIAGNOSIS — N186 End stage renal disease: Secondary | ICD-10-CM | POA: Diagnosis not present

## 2018-12-20 DIAGNOSIS — N2581 Secondary hyperparathyroidism of renal origin: Secondary | ICD-10-CM | POA: Diagnosis not present

## 2018-12-20 DIAGNOSIS — D509 Iron deficiency anemia, unspecified: Secondary | ICD-10-CM | POA: Diagnosis not present

## 2018-12-22 DIAGNOSIS — D509 Iron deficiency anemia, unspecified: Secondary | ICD-10-CM | POA: Diagnosis not present

## 2018-12-22 DIAGNOSIS — N2581 Secondary hyperparathyroidism of renal origin: Secondary | ICD-10-CM | POA: Diagnosis not present

## 2018-12-22 DIAGNOSIS — N186 End stage renal disease: Secondary | ICD-10-CM | POA: Diagnosis not present

## 2018-12-24 DIAGNOSIS — N2581 Secondary hyperparathyroidism of renal origin: Secondary | ICD-10-CM | POA: Diagnosis not present

## 2018-12-24 DIAGNOSIS — D509 Iron deficiency anemia, unspecified: Secondary | ICD-10-CM | POA: Diagnosis not present

## 2018-12-24 DIAGNOSIS — N186 End stage renal disease: Secondary | ICD-10-CM | POA: Diagnosis not present

## 2018-12-27 DIAGNOSIS — N186 End stage renal disease: Secondary | ICD-10-CM | POA: Diagnosis not present

## 2018-12-27 DIAGNOSIS — D509 Iron deficiency anemia, unspecified: Secondary | ICD-10-CM | POA: Diagnosis not present

## 2018-12-27 DIAGNOSIS — N2581 Secondary hyperparathyroidism of renal origin: Secondary | ICD-10-CM | POA: Diagnosis not present

## 2018-12-29 DIAGNOSIS — N2581 Secondary hyperparathyroidism of renal origin: Secondary | ICD-10-CM | POA: Diagnosis not present

## 2018-12-29 DIAGNOSIS — N186 End stage renal disease: Secondary | ICD-10-CM | POA: Diagnosis not present

## 2018-12-29 DIAGNOSIS — D509 Iron deficiency anemia, unspecified: Secondary | ICD-10-CM | POA: Diagnosis not present

## 2018-12-31 DIAGNOSIS — D509 Iron deficiency anemia, unspecified: Secondary | ICD-10-CM | POA: Diagnosis not present

## 2018-12-31 DIAGNOSIS — N2581 Secondary hyperparathyroidism of renal origin: Secondary | ICD-10-CM | POA: Diagnosis not present

## 2018-12-31 DIAGNOSIS — N186 End stage renal disease: Secondary | ICD-10-CM | POA: Diagnosis not present

## 2019-01-03 DIAGNOSIS — N186 End stage renal disease: Secondary | ICD-10-CM | POA: Diagnosis not present

## 2019-01-03 DIAGNOSIS — D509 Iron deficiency anemia, unspecified: Secondary | ICD-10-CM | POA: Diagnosis not present

## 2019-01-03 DIAGNOSIS — N2581 Secondary hyperparathyroidism of renal origin: Secondary | ICD-10-CM | POA: Diagnosis not present

## 2019-01-05 DIAGNOSIS — N2581 Secondary hyperparathyroidism of renal origin: Secondary | ICD-10-CM | POA: Diagnosis not present

## 2019-01-05 DIAGNOSIS — N186 End stage renal disease: Secondary | ICD-10-CM | POA: Diagnosis not present

## 2019-01-05 DIAGNOSIS — D509 Iron deficiency anemia, unspecified: Secondary | ICD-10-CM | POA: Diagnosis not present

## 2019-01-10 DIAGNOSIS — N2581 Secondary hyperparathyroidism of renal origin: Secondary | ICD-10-CM | POA: Diagnosis not present

## 2019-01-10 DIAGNOSIS — D509 Iron deficiency anemia, unspecified: Secondary | ICD-10-CM | POA: Diagnosis not present

## 2019-01-10 DIAGNOSIS — N186 End stage renal disease: Secondary | ICD-10-CM | POA: Diagnosis not present

## 2019-01-12 DIAGNOSIS — D509 Iron deficiency anemia, unspecified: Secondary | ICD-10-CM | POA: Diagnosis not present

## 2019-01-12 DIAGNOSIS — N186 End stage renal disease: Secondary | ICD-10-CM | POA: Diagnosis not present

## 2019-01-12 DIAGNOSIS — N2581 Secondary hyperparathyroidism of renal origin: Secondary | ICD-10-CM | POA: Diagnosis not present

## 2019-01-14 DIAGNOSIS — D509 Iron deficiency anemia, unspecified: Secondary | ICD-10-CM | POA: Diagnosis not present

## 2019-01-14 DIAGNOSIS — N186 End stage renal disease: Secondary | ICD-10-CM | POA: Diagnosis not present

## 2019-01-14 DIAGNOSIS — N2581 Secondary hyperparathyroidism of renal origin: Secondary | ICD-10-CM | POA: Diagnosis not present

## 2019-01-15 DIAGNOSIS — N186 End stage renal disease: Secondary | ICD-10-CM | POA: Diagnosis not present

## 2019-01-15 DIAGNOSIS — Z992 Dependence on renal dialysis: Secondary | ICD-10-CM | POA: Diagnosis not present

## 2019-01-15 DIAGNOSIS — I120 Hypertensive chronic kidney disease with stage 5 chronic kidney disease or end stage renal disease: Secondary | ICD-10-CM | POA: Diagnosis not present

## 2019-01-17 DIAGNOSIS — D509 Iron deficiency anemia, unspecified: Secondary | ICD-10-CM | POA: Diagnosis not present

## 2019-01-17 DIAGNOSIS — N186 End stage renal disease: Secondary | ICD-10-CM | POA: Diagnosis not present

## 2019-01-17 DIAGNOSIS — N2581 Secondary hyperparathyroidism of renal origin: Secondary | ICD-10-CM | POA: Diagnosis not present

## 2019-01-19 DIAGNOSIS — N186 End stage renal disease: Secondary | ICD-10-CM | POA: Diagnosis not present

## 2019-01-19 DIAGNOSIS — D509 Iron deficiency anemia, unspecified: Secondary | ICD-10-CM | POA: Diagnosis not present

## 2019-01-19 DIAGNOSIS — N2581 Secondary hyperparathyroidism of renal origin: Secondary | ICD-10-CM | POA: Diagnosis not present

## 2019-01-21 DIAGNOSIS — N186 End stage renal disease: Secondary | ICD-10-CM | POA: Diagnosis not present

## 2019-01-21 DIAGNOSIS — D509 Iron deficiency anemia, unspecified: Secondary | ICD-10-CM | POA: Diagnosis not present

## 2019-01-21 DIAGNOSIS — N2581 Secondary hyperparathyroidism of renal origin: Secondary | ICD-10-CM | POA: Diagnosis not present

## 2019-01-26 DIAGNOSIS — N2581 Secondary hyperparathyroidism of renal origin: Secondary | ICD-10-CM | POA: Diagnosis not present

## 2019-01-26 DIAGNOSIS — D509 Iron deficiency anemia, unspecified: Secondary | ICD-10-CM | POA: Diagnosis not present

## 2019-01-26 DIAGNOSIS — N186 End stage renal disease: Secondary | ICD-10-CM | POA: Diagnosis not present

## 2019-01-31 DIAGNOSIS — N186 End stage renal disease: Secondary | ICD-10-CM | POA: Diagnosis not present

## 2019-01-31 DIAGNOSIS — N2581 Secondary hyperparathyroidism of renal origin: Secondary | ICD-10-CM | POA: Diagnosis not present

## 2019-01-31 DIAGNOSIS — D509 Iron deficiency anemia, unspecified: Secondary | ICD-10-CM | POA: Diagnosis not present

## 2019-02-02 DIAGNOSIS — D509 Iron deficiency anemia, unspecified: Secondary | ICD-10-CM | POA: Diagnosis not present

## 2019-02-02 DIAGNOSIS — N2581 Secondary hyperparathyroidism of renal origin: Secondary | ICD-10-CM | POA: Diagnosis not present

## 2019-02-02 DIAGNOSIS — N186 End stage renal disease: Secondary | ICD-10-CM | POA: Diagnosis not present

## 2019-02-07 DIAGNOSIS — N186 End stage renal disease: Secondary | ICD-10-CM | POA: Diagnosis not present

## 2019-02-07 DIAGNOSIS — D509 Iron deficiency anemia, unspecified: Secondary | ICD-10-CM | POA: Diagnosis not present

## 2019-02-07 DIAGNOSIS — N2581 Secondary hyperparathyroidism of renal origin: Secondary | ICD-10-CM | POA: Diagnosis not present

## 2019-02-09 DIAGNOSIS — D509 Iron deficiency anemia, unspecified: Secondary | ICD-10-CM | POA: Diagnosis not present

## 2019-02-09 DIAGNOSIS — N2581 Secondary hyperparathyroidism of renal origin: Secondary | ICD-10-CM | POA: Diagnosis not present

## 2019-02-09 DIAGNOSIS — N186 End stage renal disease: Secondary | ICD-10-CM | POA: Diagnosis not present

## 2019-02-11 DIAGNOSIS — N2581 Secondary hyperparathyroidism of renal origin: Secondary | ICD-10-CM | POA: Diagnosis not present

## 2019-02-11 DIAGNOSIS — N186 End stage renal disease: Secondary | ICD-10-CM | POA: Diagnosis not present

## 2019-02-11 DIAGNOSIS — D509 Iron deficiency anemia, unspecified: Secondary | ICD-10-CM | POA: Diagnosis not present

## 2019-02-13 DIAGNOSIS — Z992 Dependence on renal dialysis: Secondary | ICD-10-CM | POA: Diagnosis not present

## 2019-02-13 DIAGNOSIS — I120 Hypertensive chronic kidney disease with stage 5 chronic kidney disease or end stage renal disease: Secondary | ICD-10-CM | POA: Diagnosis not present

## 2019-02-13 DIAGNOSIS — N186 End stage renal disease: Secondary | ICD-10-CM | POA: Diagnosis not present

## 2019-02-14 DIAGNOSIS — D509 Iron deficiency anemia, unspecified: Secondary | ICD-10-CM | POA: Diagnosis not present

## 2019-02-14 DIAGNOSIS — N2581 Secondary hyperparathyroidism of renal origin: Secondary | ICD-10-CM | POA: Diagnosis not present

## 2019-02-14 DIAGNOSIS — N186 End stage renal disease: Secondary | ICD-10-CM | POA: Diagnosis not present

## 2019-02-16 DIAGNOSIS — D509 Iron deficiency anemia, unspecified: Secondary | ICD-10-CM | POA: Diagnosis not present

## 2019-02-16 DIAGNOSIS — N186 End stage renal disease: Secondary | ICD-10-CM | POA: Diagnosis not present

## 2019-02-16 DIAGNOSIS — N2581 Secondary hyperparathyroidism of renal origin: Secondary | ICD-10-CM | POA: Diagnosis not present

## 2019-02-18 DIAGNOSIS — N2581 Secondary hyperparathyroidism of renal origin: Secondary | ICD-10-CM | POA: Diagnosis not present

## 2019-02-18 DIAGNOSIS — D509 Iron deficiency anemia, unspecified: Secondary | ICD-10-CM | POA: Diagnosis not present

## 2019-02-18 DIAGNOSIS — N186 End stage renal disease: Secondary | ICD-10-CM | POA: Diagnosis not present

## 2019-02-21 DIAGNOSIS — D509 Iron deficiency anemia, unspecified: Secondary | ICD-10-CM | POA: Diagnosis not present

## 2019-02-21 DIAGNOSIS — N186 End stage renal disease: Secondary | ICD-10-CM | POA: Diagnosis not present

## 2019-02-21 DIAGNOSIS — N2581 Secondary hyperparathyroidism of renal origin: Secondary | ICD-10-CM | POA: Diagnosis not present

## 2019-02-28 DIAGNOSIS — D509 Iron deficiency anemia, unspecified: Secondary | ICD-10-CM | POA: Diagnosis not present

## 2019-02-28 DIAGNOSIS — N2581 Secondary hyperparathyroidism of renal origin: Secondary | ICD-10-CM | POA: Diagnosis not present

## 2019-02-28 DIAGNOSIS — N186 End stage renal disease: Secondary | ICD-10-CM | POA: Diagnosis not present

## 2019-03-02 DIAGNOSIS — N186 End stage renal disease: Secondary | ICD-10-CM | POA: Diagnosis not present

## 2019-03-02 DIAGNOSIS — D509 Iron deficiency anemia, unspecified: Secondary | ICD-10-CM | POA: Diagnosis not present

## 2019-03-02 DIAGNOSIS — N2581 Secondary hyperparathyroidism of renal origin: Secondary | ICD-10-CM | POA: Diagnosis not present

## 2019-03-04 DIAGNOSIS — N186 End stage renal disease: Secondary | ICD-10-CM | POA: Diagnosis not present

## 2019-03-04 DIAGNOSIS — D509 Iron deficiency anemia, unspecified: Secondary | ICD-10-CM | POA: Diagnosis not present

## 2019-03-04 DIAGNOSIS — N2581 Secondary hyperparathyroidism of renal origin: Secondary | ICD-10-CM | POA: Diagnosis not present

## 2019-03-07 DIAGNOSIS — N186 End stage renal disease: Secondary | ICD-10-CM | POA: Diagnosis not present

## 2019-03-07 DIAGNOSIS — N2581 Secondary hyperparathyroidism of renal origin: Secondary | ICD-10-CM | POA: Diagnosis not present

## 2019-03-07 DIAGNOSIS — D509 Iron deficiency anemia, unspecified: Secondary | ICD-10-CM | POA: Diagnosis not present

## 2019-03-11 DIAGNOSIS — N186 End stage renal disease: Secondary | ICD-10-CM | POA: Diagnosis not present

## 2019-03-11 DIAGNOSIS — N2581 Secondary hyperparathyroidism of renal origin: Secondary | ICD-10-CM | POA: Diagnosis not present

## 2019-03-11 DIAGNOSIS — D509 Iron deficiency anemia, unspecified: Secondary | ICD-10-CM | POA: Diagnosis not present

## 2019-03-16 DIAGNOSIS — N186 End stage renal disease: Secondary | ICD-10-CM | POA: Diagnosis not present

## 2019-03-16 DIAGNOSIS — D509 Iron deficiency anemia, unspecified: Secondary | ICD-10-CM | POA: Diagnosis not present

## 2019-03-16 DIAGNOSIS — Z992 Dependence on renal dialysis: Secondary | ICD-10-CM | POA: Diagnosis not present

## 2019-03-16 DIAGNOSIS — I120 Hypertensive chronic kidney disease with stage 5 chronic kidney disease or end stage renal disease: Secondary | ICD-10-CM | POA: Diagnosis not present

## 2019-03-16 DIAGNOSIS — N2581 Secondary hyperparathyroidism of renal origin: Secondary | ICD-10-CM | POA: Diagnosis not present

## 2019-03-21 DIAGNOSIS — D509 Iron deficiency anemia, unspecified: Secondary | ICD-10-CM | POA: Diagnosis not present

## 2019-03-21 DIAGNOSIS — N2581 Secondary hyperparathyroidism of renal origin: Secondary | ICD-10-CM | POA: Diagnosis not present

## 2019-03-21 DIAGNOSIS — N186 End stage renal disease: Secondary | ICD-10-CM | POA: Diagnosis not present

## 2019-03-23 DIAGNOSIS — N2581 Secondary hyperparathyroidism of renal origin: Secondary | ICD-10-CM | POA: Diagnosis not present

## 2019-03-23 DIAGNOSIS — D509 Iron deficiency anemia, unspecified: Secondary | ICD-10-CM | POA: Diagnosis not present

## 2019-03-23 DIAGNOSIS — N186 End stage renal disease: Secondary | ICD-10-CM | POA: Diagnosis not present

## 2019-03-25 DIAGNOSIS — N186 End stage renal disease: Secondary | ICD-10-CM | POA: Diagnosis not present

## 2019-03-25 DIAGNOSIS — D509 Iron deficiency anemia, unspecified: Secondary | ICD-10-CM | POA: Diagnosis not present

## 2019-03-25 DIAGNOSIS — N2581 Secondary hyperparathyroidism of renal origin: Secondary | ICD-10-CM | POA: Diagnosis not present

## 2019-03-28 DIAGNOSIS — N186 End stage renal disease: Secondary | ICD-10-CM | POA: Diagnosis not present

## 2019-03-28 DIAGNOSIS — N2581 Secondary hyperparathyroidism of renal origin: Secondary | ICD-10-CM | POA: Diagnosis not present

## 2019-03-28 DIAGNOSIS — D509 Iron deficiency anemia, unspecified: Secondary | ICD-10-CM | POA: Diagnosis not present

## 2019-03-30 DIAGNOSIS — N186 End stage renal disease: Secondary | ICD-10-CM | POA: Diagnosis not present

## 2019-03-30 DIAGNOSIS — D509 Iron deficiency anemia, unspecified: Secondary | ICD-10-CM | POA: Diagnosis not present

## 2019-03-30 DIAGNOSIS — N2581 Secondary hyperparathyroidism of renal origin: Secondary | ICD-10-CM | POA: Diagnosis not present

## 2019-04-01 DIAGNOSIS — N186 End stage renal disease: Secondary | ICD-10-CM | POA: Diagnosis not present

## 2019-04-01 DIAGNOSIS — D509 Iron deficiency anemia, unspecified: Secondary | ICD-10-CM | POA: Diagnosis not present

## 2019-04-01 DIAGNOSIS — N2581 Secondary hyperparathyroidism of renal origin: Secondary | ICD-10-CM | POA: Diagnosis not present

## 2019-04-06 DIAGNOSIS — N186 End stage renal disease: Secondary | ICD-10-CM | POA: Diagnosis not present

## 2019-04-06 DIAGNOSIS — D509 Iron deficiency anemia, unspecified: Secondary | ICD-10-CM | POA: Diagnosis not present

## 2019-04-06 DIAGNOSIS — N2581 Secondary hyperparathyroidism of renal origin: Secondary | ICD-10-CM | POA: Diagnosis not present

## 2019-04-08 DIAGNOSIS — D509 Iron deficiency anemia, unspecified: Secondary | ICD-10-CM | POA: Diagnosis not present

## 2019-04-08 DIAGNOSIS — N2581 Secondary hyperparathyroidism of renal origin: Secondary | ICD-10-CM | POA: Diagnosis not present

## 2019-04-08 DIAGNOSIS — N186 End stage renal disease: Secondary | ICD-10-CM | POA: Diagnosis not present

## 2019-04-11 DIAGNOSIS — D509 Iron deficiency anemia, unspecified: Secondary | ICD-10-CM | POA: Diagnosis not present

## 2019-04-11 DIAGNOSIS — N186 End stage renal disease: Secondary | ICD-10-CM | POA: Diagnosis not present

## 2019-04-11 DIAGNOSIS — N2581 Secondary hyperparathyroidism of renal origin: Secondary | ICD-10-CM | POA: Diagnosis not present

## 2019-04-13 DIAGNOSIS — N2581 Secondary hyperparathyroidism of renal origin: Secondary | ICD-10-CM | POA: Diagnosis not present

## 2019-04-13 DIAGNOSIS — N186 End stage renal disease: Secondary | ICD-10-CM | POA: Diagnosis not present

## 2019-04-13 DIAGNOSIS — D509 Iron deficiency anemia, unspecified: Secondary | ICD-10-CM | POA: Diagnosis not present

## 2019-04-15 DIAGNOSIS — Z992 Dependence on renal dialysis: Secondary | ICD-10-CM | POA: Diagnosis not present

## 2019-04-15 DIAGNOSIS — I120 Hypertensive chronic kidney disease with stage 5 chronic kidney disease or end stage renal disease: Secondary | ICD-10-CM | POA: Diagnosis not present

## 2019-04-15 DIAGNOSIS — N186 End stage renal disease: Secondary | ICD-10-CM | POA: Diagnosis not present

## 2019-04-15 DIAGNOSIS — N2581 Secondary hyperparathyroidism of renal origin: Secondary | ICD-10-CM | POA: Diagnosis not present

## 2019-04-15 DIAGNOSIS — D509 Iron deficiency anemia, unspecified: Secondary | ICD-10-CM | POA: Diagnosis not present

## 2019-04-18 DIAGNOSIS — N186 End stage renal disease: Secondary | ICD-10-CM | POA: Diagnosis not present

## 2019-04-18 DIAGNOSIS — D509 Iron deficiency anemia, unspecified: Secondary | ICD-10-CM | POA: Diagnosis not present

## 2019-04-18 DIAGNOSIS — N2581 Secondary hyperparathyroidism of renal origin: Secondary | ICD-10-CM | POA: Diagnosis not present

## 2019-04-20 DIAGNOSIS — D509 Iron deficiency anemia, unspecified: Secondary | ICD-10-CM | POA: Diagnosis not present

## 2019-04-20 DIAGNOSIS — N2581 Secondary hyperparathyroidism of renal origin: Secondary | ICD-10-CM | POA: Diagnosis not present

## 2019-04-20 DIAGNOSIS — N186 End stage renal disease: Secondary | ICD-10-CM | POA: Diagnosis not present

## 2019-04-22 DIAGNOSIS — N186 End stage renal disease: Secondary | ICD-10-CM | POA: Diagnosis not present

## 2019-04-22 DIAGNOSIS — N2581 Secondary hyperparathyroidism of renal origin: Secondary | ICD-10-CM | POA: Diagnosis not present

## 2019-04-22 DIAGNOSIS — D509 Iron deficiency anemia, unspecified: Secondary | ICD-10-CM | POA: Diagnosis not present

## 2019-04-25 DIAGNOSIS — N2581 Secondary hyperparathyroidism of renal origin: Secondary | ICD-10-CM | POA: Diagnosis not present

## 2019-04-25 DIAGNOSIS — N186 End stage renal disease: Secondary | ICD-10-CM | POA: Diagnosis not present

## 2019-04-25 DIAGNOSIS — D509 Iron deficiency anemia, unspecified: Secondary | ICD-10-CM | POA: Diagnosis not present

## 2019-04-27 DIAGNOSIS — D509 Iron deficiency anemia, unspecified: Secondary | ICD-10-CM | POA: Diagnosis not present

## 2019-04-27 DIAGNOSIS — N2581 Secondary hyperparathyroidism of renal origin: Secondary | ICD-10-CM | POA: Diagnosis not present

## 2019-04-27 DIAGNOSIS — N186 End stage renal disease: Secondary | ICD-10-CM | POA: Diagnosis not present

## 2019-04-29 DIAGNOSIS — D509 Iron deficiency anemia, unspecified: Secondary | ICD-10-CM | POA: Diagnosis not present

## 2019-04-29 DIAGNOSIS — N2581 Secondary hyperparathyroidism of renal origin: Secondary | ICD-10-CM | POA: Diagnosis not present

## 2019-04-29 DIAGNOSIS — N186 End stage renal disease: Secondary | ICD-10-CM | POA: Diagnosis not present

## 2019-05-02 DIAGNOSIS — N2581 Secondary hyperparathyroidism of renal origin: Secondary | ICD-10-CM | POA: Diagnosis not present

## 2019-05-02 DIAGNOSIS — N186 End stage renal disease: Secondary | ICD-10-CM | POA: Diagnosis not present

## 2019-05-02 DIAGNOSIS — D509 Iron deficiency anemia, unspecified: Secondary | ICD-10-CM | POA: Diagnosis not present

## 2019-05-04 DIAGNOSIS — N186 End stage renal disease: Secondary | ICD-10-CM | POA: Diagnosis not present

## 2019-05-04 DIAGNOSIS — D509 Iron deficiency anemia, unspecified: Secondary | ICD-10-CM | POA: Diagnosis not present

## 2019-05-04 DIAGNOSIS — N2581 Secondary hyperparathyroidism of renal origin: Secondary | ICD-10-CM | POA: Diagnosis not present

## 2019-05-06 DIAGNOSIS — N2581 Secondary hyperparathyroidism of renal origin: Secondary | ICD-10-CM | POA: Diagnosis not present

## 2019-05-06 DIAGNOSIS — N186 End stage renal disease: Secondary | ICD-10-CM | POA: Diagnosis not present

## 2019-05-06 DIAGNOSIS — D509 Iron deficiency anemia, unspecified: Secondary | ICD-10-CM | POA: Diagnosis not present

## 2019-05-09 DIAGNOSIS — N2581 Secondary hyperparathyroidism of renal origin: Secondary | ICD-10-CM | POA: Diagnosis not present

## 2019-05-09 DIAGNOSIS — N186 End stage renal disease: Secondary | ICD-10-CM | POA: Diagnosis not present

## 2019-05-09 DIAGNOSIS — D509 Iron deficiency anemia, unspecified: Secondary | ICD-10-CM | POA: Diagnosis not present

## 2019-05-13 DIAGNOSIS — D509 Iron deficiency anemia, unspecified: Secondary | ICD-10-CM | POA: Diagnosis not present

## 2019-05-13 DIAGNOSIS — N2581 Secondary hyperparathyroidism of renal origin: Secondary | ICD-10-CM | POA: Diagnosis not present

## 2019-05-13 DIAGNOSIS — N186 End stage renal disease: Secondary | ICD-10-CM | POA: Diagnosis not present

## 2019-05-16 DIAGNOSIS — I120 Hypertensive chronic kidney disease with stage 5 chronic kidney disease or end stage renal disease: Secondary | ICD-10-CM | POA: Diagnosis not present

## 2019-05-16 DIAGNOSIS — N2581 Secondary hyperparathyroidism of renal origin: Secondary | ICD-10-CM | POA: Diagnosis not present

## 2019-05-16 DIAGNOSIS — D509 Iron deficiency anemia, unspecified: Secondary | ICD-10-CM | POA: Diagnosis not present

## 2019-05-16 DIAGNOSIS — N186 End stage renal disease: Secondary | ICD-10-CM | POA: Diagnosis not present

## 2019-05-16 DIAGNOSIS — Z992 Dependence on renal dialysis: Secondary | ICD-10-CM | POA: Diagnosis not present

## 2019-05-23 DIAGNOSIS — N186 End stage renal disease: Secondary | ICD-10-CM | POA: Diagnosis not present

## 2019-05-23 DIAGNOSIS — D509 Iron deficiency anemia, unspecified: Secondary | ICD-10-CM | POA: Diagnosis not present

## 2019-05-23 DIAGNOSIS — N2581 Secondary hyperparathyroidism of renal origin: Secondary | ICD-10-CM | POA: Diagnosis not present

## 2019-05-25 DIAGNOSIS — N2581 Secondary hyperparathyroidism of renal origin: Secondary | ICD-10-CM | POA: Diagnosis not present

## 2019-05-25 DIAGNOSIS — N186 End stage renal disease: Secondary | ICD-10-CM | POA: Diagnosis not present

## 2019-05-25 DIAGNOSIS — D509 Iron deficiency anemia, unspecified: Secondary | ICD-10-CM | POA: Diagnosis not present

## 2019-05-30 DIAGNOSIS — N2581 Secondary hyperparathyroidism of renal origin: Secondary | ICD-10-CM | POA: Diagnosis not present

## 2019-05-30 DIAGNOSIS — N186 End stage renal disease: Secondary | ICD-10-CM | POA: Diagnosis not present

## 2019-05-30 DIAGNOSIS — D509 Iron deficiency anemia, unspecified: Secondary | ICD-10-CM | POA: Diagnosis not present

## 2019-06-01 DIAGNOSIS — D509 Iron deficiency anemia, unspecified: Secondary | ICD-10-CM | POA: Diagnosis not present

## 2019-06-01 DIAGNOSIS — N186 End stage renal disease: Secondary | ICD-10-CM | POA: Diagnosis not present

## 2019-06-01 DIAGNOSIS — N2581 Secondary hyperparathyroidism of renal origin: Secondary | ICD-10-CM | POA: Diagnosis not present

## 2019-06-03 DIAGNOSIS — N186 End stage renal disease: Secondary | ICD-10-CM | POA: Diagnosis not present

## 2019-06-03 DIAGNOSIS — N2581 Secondary hyperparathyroidism of renal origin: Secondary | ICD-10-CM | POA: Diagnosis not present

## 2019-06-03 DIAGNOSIS — D509 Iron deficiency anemia, unspecified: Secondary | ICD-10-CM | POA: Diagnosis not present

## 2019-06-10 DIAGNOSIS — N2581 Secondary hyperparathyroidism of renal origin: Secondary | ICD-10-CM | POA: Diagnosis not present

## 2019-06-10 DIAGNOSIS — N186 End stage renal disease: Secondary | ICD-10-CM | POA: Diagnosis not present

## 2019-06-10 DIAGNOSIS — D509 Iron deficiency anemia, unspecified: Secondary | ICD-10-CM | POA: Diagnosis not present

## 2019-06-13 DIAGNOSIS — N2581 Secondary hyperparathyroidism of renal origin: Secondary | ICD-10-CM | POA: Diagnosis not present

## 2019-06-13 DIAGNOSIS — D509 Iron deficiency anemia, unspecified: Secondary | ICD-10-CM | POA: Diagnosis not present

## 2019-06-13 DIAGNOSIS — N186 End stage renal disease: Secondary | ICD-10-CM | POA: Diagnosis not present

## 2019-06-15 DIAGNOSIS — N186 End stage renal disease: Secondary | ICD-10-CM | POA: Diagnosis not present

## 2019-06-15 DIAGNOSIS — D509 Iron deficiency anemia, unspecified: Secondary | ICD-10-CM | POA: Diagnosis not present

## 2019-06-15 DIAGNOSIS — I120 Hypertensive chronic kidney disease with stage 5 chronic kidney disease or end stage renal disease: Secondary | ICD-10-CM | POA: Diagnosis not present

## 2019-06-15 DIAGNOSIS — N2581 Secondary hyperparathyroidism of renal origin: Secondary | ICD-10-CM | POA: Diagnosis not present

## 2019-06-15 DIAGNOSIS — Z992 Dependence on renal dialysis: Secondary | ICD-10-CM | POA: Diagnosis not present

## 2019-06-20 DIAGNOSIS — N186 End stage renal disease: Secondary | ICD-10-CM | POA: Diagnosis not present

## 2019-06-20 DIAGNOSIS — D509 Iron deficiency anemia, unspecified: Secondary | ICD-10-CM | POA: Diagnosis not present

## 2019-06-20 DIAGNOSIS — N2581 Secondary hyperparathyroidism of renal origin: Secondary | ICD-10-CM | POA: Diagnosis not present

## 2019-06-22 DIAGNOSIS — N186 End stage renal disease: Secondary | ICD-10-CM | POA: Diagnosis not present

## 2019-06-22 DIAGNOSIS — N2581 Secondary hyperparathyroidism of renal origin: Secondary | ICD-10-CM | POA: Diagnosis not present

## 2019-06-22 DIAGNOSIS — D509 Iron deficiency anemia, unspecified: Secondary | ICD-10-CM | POA: Diagnosis not present

## 2019-06-24 DIAGNOSIS — D509 Iron deficiency anemia, unspecified: Secondary | ICD-10-CM | POA: Diagnosis not present

## 2019-06-24 DIAGNOSIS — N2581 Secondary hyperparathyroidism of renal origin: Secondary | ICD-10-CM | POA: Diagnosis not present

## 2019-06-24 DIAGNOSIS — N186 End stage renal disease: Secondary | ICD-10-CM | POA: Diagnosis not present

## 2019-06-27 DIAGNOSIS — D509 Iron deficiency anemia, unspecified: Secondary | ICD-10-CM | POA: Diagnosis not present

## 2019-06-27 DIAGNOSIS — N186 End stage renal disease: Secondary | ICD-10-CM | POA: Diagnosis not present

## 2019-06-27 DIAGNOSIS — N2581 Secondary hyperparathyroidism of renal origin: Secondary | ICD-10-CM | POA: Diagnosis not present

## 2019-06-29 ENCOUNTER — Encounter: Payer: Medicare Other | Admitting: Internal Medicine

## 2019-06-29 ENCOUNTER — Encounter: Payer: Self-pay | Admitting: Internal Medicine

## 2019-06-29 DIAGNOSIS — N2581 Secondary hyperparathyroidism of renal origin: Secondary | ICD-10-CM | POA: Diagnosis not present

## 2019-06-29 DIAGNOSIS — D509 Iron deficiency anemia, unspecified: Secondary | ICD-10-CM | POA: Diagnosis not present

## 2019-06-29 DIAGNOSIS — N186 End stage renal disease: Secondary | ICD-10-CM | POA: Diagnosis not present

## 2019-07-01 DIAGNOSIS — N186 End stage renal disease: Secondary | ICD-10-CM | POA: Diagnosis not present

## 2019-07-01 DIAGNOSIS — N2581 Secondary hyperparathyroidism of renal origin: Secondary | ICD-10-CM | POA: Diagnosis not present

## 2019-07-01 DIAGNOSIS — D509 Iron deficiency anemia, unspecified: Secondary | ICD-10-CM | POA: Diagnosis not present

## 2019-07-04 DIAGNOSIS — N2581 Secondary hyperparathyroidism of renal origin: Secondary | ICD-10-CM | POA: Diagnosis not present

## 2019-07-04 DIAGNOSIS — N186 End stage renal disease: Secondary | ICD-10-CM | POA: Diagnosis not present

## 2019-07-04 DIAGNOSIS — D509 Iron deficiency anemia, unspecified: Secondary | ICD-10-CM | POA: Diagnosis not present

## 2019-07-06 DIAGNOSIS — N186 End stage renal disease: Secondary | ICD-10-CM | POA: Diagnosis not present

## 2019-07-06 DIAGNOSIS — D509 Iron deficiency anemia, unspecified: Secondary | ICD-10-CM | POA: Diagnosis not present

## 2019-07-06 DIAGNOSIS — N2581 Secondary hyperparathyroidism of renal origin: Secondary | ICD-10-CM | POA: Diagnosis not present

## 2019-07-11 DIAGNOSIS — N2581 Secondary hyperparathyroidism of renal origin: Secondary | ICD-10-CM | POA: Diagnosis not present

## 2019-07-11 DIAGNOSIS — D509 Iron deficiency anemia, unspecified: Secondary | ICD-10-CM | POA: Diagnosis not present

## 2019-07-11 DIAGNOSIS — N186 End stage renal disease: Secondary | ICD-10-CM | POA: Diagnosis not present

## 2019-07-13 DIAGNOSIS — D509 Iron deficiency anemia, unspecified: Secondary | ICD-10-CM | POA: Diagnosis not present

## 2019-07-13 DIAGNOSIS — N186 End stage renal disease: Secondary | ICD-10-CM | POA: Diagnosis not present

## 2019-07-13 DIAGNOSIS — N2581 Secondary hyperparathyroidism of renal origin: Secondary | ICD-10-CM | POA: Diagnosis not present

## 2019-07-15 DIAGNOSIS — N2581 Secondary hyperparathyroidism of renal origin: Secondary | ICD-10-CM | POA: Diagnosis not present

## 2019-07-15 DIAGNOSIS — N186 End stage renal disease: Secondary | ICD-10-CM | POA: Diagnosis not present

## 2019-07-15 DIAGNOSIS — D509 Iron deficiency anemia, unspecified: Secondary | ICD-10-CM | POA: Diagnosis not present

## 2019-07-16 DIAGNOSIS — N186 End stage renal disease: Secondary | ICD-10-CM | POA: Diagnosis not present

## 2019-07-16 DIAGNOSIS — I120 Hypertensive chronic kidney disease with stage 5 chronic kidney disease or end stage renal disease: Secondary | ICD-10-CM | POA: Diagnosis not present

## 2019-07-16 DIAGNOSIS — Z992 Dependence on renal dialysis: Secondary | ICD-10-CM | POA: Diagnosis not present

## 2019-07-18 DIAGNOSIS — N2581 Secondary hyperparathyroidism of renal origin: Secondary | ICD-10-CM | POA: Diagnosis not present

## 2019-07-18 DIAGNOSIS — Z992 Dependence on renal dialysis: Secondary | ICD-10-CM | POA: Diagnosis not present

## 2019-07-18 DIAGNOSIS — D509 Iron deficiency anemia, unspecified: Secondary | ICD-10-CM | POA: Diagnosis not present

## 2019-07-18 DIAGNOSIS — N186 End stage renal disease: Secondary | ICD-10-CM | POA: Diagnosis not present

## 2019-07-20 DIAGNOSIS — N186 End stage renal disease: Secondary | ICD-10-CM | POA: Diagnosis not present

## 2019-07-20 DIAGNOSIS — D509 Iron deficiency anemia, unspecified: Secondary | ICD-10-CM | POA: Diagnosis not present

## 2019-07-20 DIAGNOSIS — N2581 Secondary hyperparathyroidism of renal origin: Secondary | ICD-10-CM | POA: Diagnosis not present

## 2019-07-20 DIAGNOSIS — Z992 Dependence on renal dialysis: Secondary | ICD-10-CM | POA: Diagnosis not present

## 2019-07-22 DIAGNOSIS — D509 Iron deficiency anemia, unspecified: Secondary | ICD-10-CM | POA: Diagnosis not present

## 2019-07-22 DIAGNOSIS — Z992 Dependence on renal dialysis: Secondary | ICD-10-CM | POA: Diagnosis not present

## 2019-07-22 DIAGNOSIS — N2581 Secondary hyperparathyroidism of renal origin: Secondary | ICD-10-CM | POA: Diagnosis not present

## 2019-07-22 DIAGNOSIS — N186 End stage renal disease: Secondary | ICD-10-CM | POA: Diagnosis not present

## 2019-07-25 DIAGNOSIS — D509 Iron deficiency anemia, unspecified: Secondary | ICD-10-CM | POA: Diagnosis not present

## 2019-07-25 DIAGNOSIS — N2581 Secondary hyperparathyroidism of renal origin: Secondary | ICD-10-CM | POA: Diagnosis not present

## 2019-07-25 DIAGNOSIS — Z992 Dependence on renal dialysis: Secondary | ICD-10-CM | POA: Diagnosis not present

## 2019-07-25 DIAGNOSIS — N186 End stage renal disease: Secondary | ICD-10-CM | POA: Diagnosis not present

## 2019-07-27 DIAGNOSIS — Z992 Dependence on renal dialysis: Secondary | ICD-10-CM | POA: Diagnosis not present

## 2019-07-27 DIAGNOSIS — N186 End stage renal disease: Secondary | ICD-10-CM | POA: Diagnosis not present

## 2019-07-27 DIAGNOSIS — D509 Iron deficiency anemia, unspecified: Secondary | ICD-10-CM | POA: Diagnosis not present

## 2019-07-27 DIAGNOSIS — N2581 Secondary hyperparathyroidism of renal origin: Secondary | ICD-10-CM | POA: Diagnosis not present

## 2019-07-29 DIAGNOSIS — D509 Iron deficiency anemia, unspecified: Secondary | ICD-10-CM | POA: Diagnosis not present

## 2019-07-29 DIAGNOSIS — N2581 Secondary hyperparathyroidism of renal origin: Secondary | ICD-10-CM | POA: Diagnosis not present

## 2019-07-29 DIAGNOSIS — N186 End stage renal disease: Secondary | ICD-10-CM | POA: Diagnosis not present

## 2019-07-29 DIAGNOSIS — Z992 Dependence on renal dialysis: Secondary | ICD-10-CM | POA: Diagnosis not present

## 2019-08-01 ENCOUNTER — Ambulatory Visit (INDEPENDENT_AMBULATORY_CARE_PROVIDER_SITE_OTHER): Payer: Medicare Other | Admitting: Physician Assistant

## 2019-08-01 ENCOUNTER — Encounter: Payer: Self-pay | Admitting: *Deleted

## 2019-08-01 ENCOUNTER — Other Ambulatory Visit: Payer: Self-pay | Admitting: *Deleted

## 2019-08-01 ENCOUNTER — Other Ambulatory Visit: Payer: Self-pay

## 2019-08-01 VITALS — BP 128/70 | HR 66 | Temp 98.0°F | Resp 12 | Ht 69.0 in | Wt 166.0 lb

## 2019-08-01 DIAGNOSIS — N186 End stage renal disease: Secondary | ICD-10-CM | POA: Diagnosis not present

## 2019-08-01 DIAGNOSIS — Z992 Dependence on renal dialysis: Secondary | ICD-10-CM | POA: Diagnosis not present

## 2019-08-01 DIAGNOSIS — N2581 Secondary hyperparathyroidism of renal origin: Secondary | ICD-10-CM | POA: Diagnosis not present

## 2019-08-01 DIAGNOSIS — D509 Iron deficiency anemia, unspecified: Secondary | ICD-10-CM | POA: Diagnosis not present

## 2019-08-01 NOTE — Progress Notes (Signed)
Pt not available. Spoke with pt's mother, Peter Congo and she was willing to take down the pre-op instructions only.   Pt to have Covid test done on arrival in the AM.

## 2019-08-01 NOTE — H&P (View-Only) (Signed)
History of Present Illness:  Patient is a 53 y.o. year old male who presents for assessment of his left UE AV fistula with non healing chronic ulcers.  He denise prolonged bleeding episodes, but the ulcers never heal.   He denise pain, loss of motor and loss of sensation.    He has had ulcer formation prior to this and had plication/revision of the fistula by Dr. Donzetta Matters 10/27/2018.  Previous plication for pseudoaneurysm by Dr. Bridgett Larsson 01/06/2017.  He has had the fistula working since Agricultural engineer in 2014.    Past Medical History:  Diagnosis Date  . Abdominal distension 08/10/2018  . Alcohol abuse 11/26/2010   Qualifier: Diagnosis of  By: [name] MD, Benjamine Mola    . Anemia due to blood loss, acute 07/15/2012  . CHF (congestive heart failure) ([place])   . Cholecystitis 04/18/2014  . Cigarette smoker 11/26/2010   Qualifier: Diagnosis of  By: [name] MD, Benjamine Mola    . Cocaine abuse ([place]) 11/26/2010   Qualifier: Diagnosis of  By: [name] MD, Benjamine Mola    . Combined congestive systolic and diastolic heart failure ([place]) 04/18/2014   A. Echo 8/13: Severe LVH, EF 40-45%, inferoposterior akinesis, grade 2 diastolic dysfunction, moderate LAE, mild RVE, mildly reduced RVSF, mild RAE; cannot rule out R atrial mass-suggest TEE or cardiac MRI  //  B. Echo 3/17: Mild LVH, EF 25-30%, diffuse HK, grade 2 diastolic dysfunction, mild MR, severe LAE,  moderately reduced RVSF, severe RAE, mild TR, moderate PI, PASP 55 mmHg    . Dyspnea   . ESRD (end stage renal disease) on dialysis [facility])    MWF- [place] (05/18/2017)  . Hemodialysis patient ([place])    M,W,F  . Hernia, inguinal, right 08/05/2016  . Hypertension   . 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 Hypocalcemia 07/15/2012  . Medical non-compliance   . Non-ischemic cardiomyopathy ([place])    A. R/L HC 3/17: Normal coronary arteries, moderate  pulmonary hypertension (PASP 65 mmHg), elevated LV filling pressures (LVEDP 45 mmHg)   . NSVT (nonsustained ventricular tachycardia) ([place]) 02/22/2016  . NSVT (nonsustained ventricular tachycardia) ([place]) 02/22/2016  . Pneumonia 11/12/2017  . Polysubstance abuse ([place])   . Prolonged Q-T interval on ECG 05/18/2017  . Prolonged QT interval 05/18/2017  . Restless leg syndrome   . Solitary pulmonary nodule 10/10/2015   See cxr 10/09/2015 - CT rec 10/10/2015 >>>   . Upper airway cough syndrome 10/09/2015   Off ACEi around 1st Oct 2016  - Sinus CT 10/10/2015 >>>      Past Surgical History:  Procedure Laterality Date  . [place] TRANSPOSITION  01/19/2013   Procedure: BASCILIC VEIN TRANSPOSITION;  Surgeon: Conrad Sedgwick, MD;  Location: [place];  Service: Vascular;  Laterality: Left;  left 2nd stage basilic vein transposition  . CARDIAC CATHETERIZATION N/A 02/25/2016   Procedure: Right/Left Heart Cath and Coronary Angiography;  Surgeon: Burnell Blanks, MD;  Location: [place] CV LAB;  Service: Cardiovascular;  Laterality: N/A;  . INGUINAL HERNIA REPAIR Right 08/05/2016   Procedure: RIGHT INGUINAL HERNIA REPAIR WITH MESH;  Surgeon: Donnie Mesa, MD;  Location: [place];  Service: General;  Laterality: Right;  . INSERTION OF DIALYSIS CATHETER  07/17/2012   Procedure: INSERTION OF DIALYSIS CATHETER;  Surgeon: Conrad [place], MD;  Location: [place];  Service: Vascular;  Laterality: Right;  right internal  jugular  . INSERTION OF MESH Right 08/05/2016   Procedure: INSERTION OF MESH;  Surgeon: Donnie Mesa, MD;  Location: [place];  Service: General;  Laterality: Right;  . REVISON OF ARTERIOVENOUS FISTULA Left 01/06/2017   Procedure: REVISON OF ARTERIOVENOUS FISTULA;  Surgeon: Conrad Kellerton, MD;  Location: [place];  Service: Vascular;  Laterality: Left;  . REVISON OF ARTERIOVENOUS FISTULA Left 10/27/2018   Procedure: REVISION PLICATION OF ARTERIOVENOUS FISTULA  LEFT UPPER ARM;  Surgeon: Waynetta Sandy, MD;   Location: [place];  Service: Vascular;  Laterality: Left;  . UMBILICAL HERNIA REPAIR N/A 08/05/2016   Procedure: [place];  Surgeon: Donnie Mesa, MD;  Location: [place];  Service: General;  Laterality: N/A;  . VENOGRAM N/A 08/09/2012   Procedure: VENOGRAM;  Surgeon: Conrad [place], MD;  Location: [facility] CATH LAB;  Service: Cardiovascular;  Laterality: N/A;     Social History Social History   Tobacco Use  . Smoking status: Former Smoker    Years: 30.00    Types: Cigarettes    Quit date: 10/20/2018    Years since quitting: 0.7  . Smokeless tobacco: Never Used  Substance Use Topics  . Alcohol use: Yes    Alcohol/week: 6.0 standard drinks    Types: 6 Cans of beer per week  . Drug use: No    Types: Marijuana    Comment: 05/18/2017 "qd"    Family History Family History  Problem Relation Age of Onset  . Hypertension Mother   . Diabetes Mother   . Renal Disease Mother   . Hypertension Father     Allergies  Allergies  Allergen Reactions  . Losartan Cough     Current Outpatient Medications  Medication Sig Dispense Refill  . calcium acetate (PHOSLO) 667 MG capsule Take 3 capsules (2,001 mg total) by mouth 3 (three) times daily with meals. (Patient taking differently: Take 1,334-3,335 mg by mouth See admin instructions. Take 3-5 capsules (2001 - 3335 mg) by mouth 3 times daily with large meals, take 2 capsules (1334 mg) with snacks) 90 capsule 0  . carvedilol (COREG) 25 MG tablet Take 1 tablet (25 mg total) by mouth 2 (two) times daily. 60 tablet 0  . clonazePAM (KLONOPIN) 0.5 MG tablet Take 1 tablet (0.5 mg total) by mouth as needed. (Patient taking differently: Take 0.5 mg by mouth every Monday, Wednesday, and Friday with hemodialysis. ) 30 tablet 3  . isosorbide mononitrate (IMDUR) 60 MG 24 hr tablet Take 1 tablet (60 mg total) by mouth daily. 30 tablet 0   No current facility-administered medications for this visit.     ROS:   General:  No weight loss,  Fever, chills  HEENT: No recent headaches, no nasal bleeding, no visual changes, no sore throat  Neurologic: No dizziness, blackouts, seizures. No recent symptoms of stroke or mini- stroke. No recent episodes of slurred speech, or temporary blindness.  Cardiac: No recent episodes of chest pain/pressure, no shortness of breath at rest.  No shortness of breath with exertion.  Denies history of atrial fibrillation or irregular heartbeat  Vascular: No history of rest pain in feet.  No history of claudication.  No history of non-healing ulcer, No history of DVT   Pulmonary: No home oxygen, no productive cough, no hemoptysis,  No asthma or wheezing  Musculoskeletal:  [ ]  Arthritis, [ ]  Low back pain,  [ ]  Joint pain  Hematologic:No history of hypercoagulable state.  No history of easy bleeding.  No  history of anemia  Gastrointestinal: No hematochezia or melena,  No gastroesophageal reflux, no trouble swallowing  Urinary: [ ]  chronic Kidney disease, [x ] on HD - [x ] MWF or [ ]  TTHS, [ ]  Burning with urination, [ ]  Frequent urination, [ ]  Difficulty urinating;   Skin: No rashes  Psychological: No history of anxiety,  No history of depression   Physical Examination  Vitals:   08/01/19 1450  BP: 128/70  Pulse: 66  Resp: 12  Temp: 98 F (36.7 C)  TempSrc: Temporal  SpO2: 99%  Weight: 166 lb (75.3 kg)  Height: 5\' 9"  (1.753 m)    Body mass index is 24.51 kg/m.  General:  Alert and oriented, no acute distress HEENT: Normal Neck: No bruit or JVD Pulmonary: Clear to auscultation bilaterally Cardiac: Regular Rate and Rhythm without murmur Gastrointestinal: Soft, non-tender, non-distended, no mass, no scars Skin: Thin shinny adhered ulcers over the fistula stick sites. Extremity Pulses:  2+ radial, brachial pulses bilaterally, palpable thrill throughout the fistula. 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  He will be scheduled on his non HD day when conveniently.    Roxy Horseman PA-C [facility] [place] Office: [phone]    MD on call Donzetta Matters

## 2019-08-01 NOTE — Progress Notes (Signed)
History of Present Illness:  Patient is a 53 y.o. year old male who presents for assessment of his left UE AV fistula with non healing chronic ulcers.  He denise prolonged bleeding episodes, but the ulcers never heal.   He denise pain, loss of motor and loss of sensation.    He has had ulcer formation prior to this and had plication/revision of the fistula by Dr. Donzetta Matters 10/27/2018.  Previous plication for pseudoaneurysm by Dr. Bridgett Larsson 01/06/2017.  He has had the fistula working since Agricultural engineer in 2014.    Past Medical History:  Diagnosis Date  . Abdominal distension 08/10/2018  . Alcohol abuse 11/26/2010   Qualifier: Diagnosis of  By: [name] MD, Benjamine Mola    . Anemia due to blood loss, acute 07/15/2012  . CHF (congestive heart failure) ([place])   . Cholecystitis 04/18/2014  . Cigarette smoker 11/26/2010   Qualifier: Diagnosis of  By: [name] MD, Benjamine Mola    . Cocaine abuse ([place]) 11/26/2010   Qualifier: Diagnosis of  By: [name] MD, Benjamine Mola    . Combined congestive systolic and diastolic heart failure ([place]) 04/18/2014   A. Echo 8/13: Severe LVH, EF 40-45%, inferoposterior akinesis, grade 2 diastolic dysfunction, moderate LAE, mild RVE, mildly reduced RVSF, mild RAE; cannot rule out R atrial mass-suggest TEE or cardiac MRI  //  B. Echo 3/17: Mild LVH, EF 25-30%, diffuse HK, grade 2 diastolic dysfunction, mild MR, severe LAE,  moderately reduced RVSF, severe RAE, mild TR, moderate PI, PASP 55 mmHg    . Dyspnea   . ESRD (end stage renal disease) on dialysis [facility])    MWF- [place] (05/18/2017)  . Hemodialysis patient ([place])    M,W,F  . Hernia, inguinal, right 08/05/2016  . Hypertension   . 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 Hypocalcemia 07/15/2012  . Medical non-compliance   . Non-ischemic cardiomyopathy ([place])    A. R/L HC 3/17: Normal coronary arteries, moderate  pulmonary hypertension (PASP 65 mmHg), elevated LV filling pressures (LVEDP 45 mmHg)   . NSVT (nonsustained ventricular tachycardia) ([place]) 02/22/2016  . NSVT (nonsustained ventricular tachycardia) ([place]) 02/22/2016  . Pneumonia 11/12/2017  . Polysubstance abuse ([place])   . Prolonged Q-T interval on ECG 05/18/2017  . Prolonged QT interval 05/18/2017  . Restless leg syndrome   . Solitary pulmonary nodule 10/10/2015   See cxr 10/09/2015 - CT rec 10/10/2015 >>>   . Upper airway cough syndrome 10/09/2015   Off ACEi around 1st Oct 2016  - Sinus CT 10/10/2015 >>>      Past Surgical History:  Procedure Laterality Date  . [place] TRANSPOSITION  01/19/2013   Procedure: BASCILIC VEIN TRANSPOSITION;  Surgeon: Conrad Penbrook, MD;  Location: [place];  Service: Vascular;  Laterality: Left;  left 2nd stage basilic vein transposition  . CARDIAC CATHETERIZATION N/A 02/25/2016   Procedure: Right/Left Heart Cath and Coronary Angiography;  Surgeon: Burnell Blanks, MD;  Location: [place] CV LAB;  Service: Cardiovascular;  Laterality: N/A;  . INGUINAL HERNIA REPAIR Right 08/05/2016   Procedure: RIGHT INGUINAL HERNIA REPAIR WITH MESH;  Surgeon: Donnie Mesa, MD;  Location: [place];  Service: General;  Laterality: Right;  . INSERTION OF DIALYSIS CATHETER  07/17/2012   Procedure: INSERTION OF DIALYSIS CATHETER;  Surgeon: Conrad Mead, MD;  Location: [place];  Service: Vascular;  Laterality: Right;  right internal  jugular  . INSERTION OF MESH Right 08/05/2016   Procedure: INSERTION OF MESH;  Surgeon: Donnie Mesa, MD;  Location: [place];  Service: General;  Laterality: Right;  . REVISON OF ARTERIOVENOUS FISTULA Left 01/06/2017   Procedure: REVISON OF ARTERIOVENOUS FISTULA;  Surgeon: Conrad Amanda Park, MD;  Location: [place];  Service: Vascular;  Laterality: Left;  . REVISON OF ARTERIOVENOUS FISTULA Left 10/27/2018   Procedure: REVISION PLICATION OF ARTERIOVENOUS FISTULA  LEFT UPPER ARM;  Surgeon: Waynetta Sandy, MD;   Location: [place];  Service: Vascular;  Laterality: Left;  . UMBILICAL HERNIA REPAIR N/A 08/05/2016   Procedure: [place];  Surgeon: Donnie Mesa, MD;  Location: [place];  Service: General;  Laterality: N/A;  . VENOGRAM N/A 08/09/2012   Procedure: VENOGRAM;  Surgeon: Conrad Delia, MD;  Location: [facility] CATH LAB;  Service: Cardiovascular;  Laterality: N/A;     Social History Social History   Tobacco Use  . Smoking status: Former Smoker    Years: 30.00    Types: Cigarettes    Quit date: 10/20/2018    Years since quitting: 0.7  . Smokeless tobacco: Never Used  Substance Use Topics  . Alcohol use: Yes    Alcohol/week: 6.0 standard drinks    Types: 6 Cans of beer per week  . Drug use: No    Types: Marijuana    Comment: 05/18/2017 "qd"    Family History Family History  Problem Relation Age of Onset  . Hypertension Mother   . Diabetes Mother   . Renal Disease Mother   . Hypertension Father     Allergies  Allergies  Allergen Reactions  . Losartan Cough     Current Outpatient Medications  Medication Sig Dispense Refill  . calcium acetate (PHOSLO) 667 MG capsule Take 3 capsules (2,001 mg total) by mouth 3 (three) times daily with meals. (Patient taking differently: Take 1,334-3,335 mg by mouth See admin instructions. Take 3-5 capsules (2001 - 3335 mg) by mouth 3 times daily with large meals, take 2 capsules (1334 mg) with snacks) 90 capsule 0  . carvedilol (COREG) 25 MG tablet Take 1 tablet (25 mg total) by mouth 2 (two) times daily. 60 tablet 0  . clonazePAM (KLONOPIN) 0.5 MG tablet Take 1 tablet (0.5 mg total) by mouth as needed. (Patient taking differently: Take 0.5 mg by mouth every Monday, Wednesday, and Friday with hemodialysis. ) 30 tablet 3  . isosorbide mononitrate (IMDUR) 60 MG 24 hr tablet Take 1 tablet (60 mg total) by mouth daily. 30 tablet 0   No current facility-administered medications for this visit.     ROS:   General:  No weight loss,  Fever, chills  HEENT: No recent headaches, no nasal bleeding, no visual changes, no sore throat  Neurologic: No dizziness, blackouts, seizures. No recent symptoms of stroke or mini- stroke. No recent episodes of slurred speech, or temporary blindness.  Cardiac: No recent episodes of chest pain/pressure, no shortness of breath at rest.  No shortness of breath with exertion.  Denies history of atrial fibrillation or irregular heartbeat  Vascular: No history of rest pain in feet.  No history of claudication.  No history of non-healing ulcer, No history of DVT   Pulmonary: No home oxygen, no productive cough, no hemoptysis,  No asthma or wheezing  Musculoskeletal:  [ ]  Arthritis, [ ]  Low back pain,  [ ]  Joint pain  Hematologic:No history of hypercoagulable state.  No history of easy bleeding.  No  history of anemia  Gastrointestinal: No hematochezia or melena,  No gastroesophageal reflux, no trouble swallowing  Urinary: [ ]  chronic Kidney disease, [x ] on HD - [x ] MWF or [ ]  TTHS, [ ]  Burning with urination, [ ]  Frequent urination, [ ]  Difficulty urinating;   Skin: No rashes  Psychological: No history of anxiety,  No history of depression   Physical Examination  Vitals:   08/01/19 1450  BP: 128/70  Pulse: 66  Resp: 12  Temp: 98 F (36.7 C)  TempSrc: Temporal  SpO2: 99%  Weight: 166 lb (75.3 kg)  Height: 5\' 9"  (1.753 m)    Body mass index is 24.51 kg/m.  General:  Alert and oriented, no acute distress HEENT: Normal Neck: No bruit or JVD Pulmonary: Clear to auscultation bilaterally Cardiac: Regular Rate and Rhythm without murmur Gastrointestinal: Soft, non-tender, non-distended, no mass, no scars Skin: Thin shinny adhered ulcers over the fistula stick sites. Extremity Pulses:  2+ radial, brachial pulses bilaterally, palpable thrill throughout the fistula. 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  He will be scheduled on his non HD day when conveniently.    Roxy Horseman PA-C [facility] [place] Office: [phone]    MD on call Donzetta Matters

## 2019-08-02 ENCOUNTER — Other Ambulatory Visit: Payer: Self-pay

## 2019-08-02 ENCOUNTER — Ambulatory Visit (HOSPITAL_COMMUNITY)
Admission: RE | Admit: 2019-08-02 | Discharge: 2019-08-02 | Disposition: A | Discharge status: Home / Self Care | Payer: Medicare Other | Attending: Vascular Surgery | Admitting: Vascular Surgery

## 2019-08-02 ENCOUNTER — Encounter (HOSPITAL_COMMUNITY): Payer: Self-pay | Admitting: *Deleted

## 2019-08-02 ENCOUNTER — Ambulatory Visit (HOSPITAL_COMMUNITY): Payer: Medicare Other | Admitting: Anesthesiology

## 2019-08-02 ENCOUNTER — Encounter (HOSPITAL_COMMUNITY)
Admission: RE | Disposition: A | Discharge status: Home / Self Care | Payer: Self-pay | Source: Home / Self Care | Attending: Vascular Surgery

## 2019-08-02 DIAGNOSIS — I5042 Chronic combined systolic (congestive) and diastolic (congestive) heart failure: Secondary | ICD-10-CM | POA: Insufficient documentation

## 2019-08-02 DIAGNOSIS — T82898A Other specified complication of vascular prosthetic devices, implants and grafts, initial encounter: Secondary | ICD-10-CM | POA: Insufficient documentation

## 2019-08-02 DIAGNOSIS — Z888 Allergy status to other drugs, medicaments and biological substances status: Secondary | ICD-10-CM | POA: Insufficient documentation

## 2019-08-02 DIAGNOSIS — Z87891 Personal history of nicotine dependence: Secondary | ICD-10-CM | POA: Insufficient documentation

## 2019-08-02 DIAGNOSIS — I272 Pulmonary hypertension, unspecified: Secondary | ICD-10-CM | POA: Diagnosis not present

## 2019-08-02 DIAGNOSIS — N186 End stage renal disease: Secondary | ICD-10-CM | POA: Diagnosis not present

## 2019-08-02 DIAGNOSIS — Y832 Surgical operation with anastomosis, bypass or graft as the cause of abnormal reaction of the patient, or of later complication, without mention of misadventure at the time of the procedure: Secondary | ICD-10-CM | POA: Diagnosis not present

## 2019-08-02 DIAGNOSIS — Z20828 Contact with and (suspected) exposure to other viral communicable diseases: Secondary | ICD-10-CM | POA: Insufficient documentation

## 2019-08-02 DIAGNOSIS — I428 Other cardiomyopathies: Secondary | ICD-10-CM | POA: Diagnosis not present

## 2019-08-02 DIAGNOSIS — Z992 Dependence on renal dialysis: Secondary | ICD-10-CM | POA: Insufficient documentation

## 2019-08-02 DIAGNOSIS — T82590A Other mechanical complication of surgically created arteriovenous fistula, initial encounter: Secondary | ICD-10-CM | POA: Diagnosis not present

## 2019-08-02 DIAGNOSIS — Z79899 Other long term (current) drug therapy: Secondary | ICD-10-CM | POA: Insufficient documentation

## 2019-08-02 DIAGNOSIS — I132 Hypertensive heart and chronic kidney disease with heart failure and with stage 5 chronic kidney disease, or end stage renal disease: Secondary | ICD-10-CM | POA: Diagnosis not present

## 2019-08-02 HISTORY — PX: REVISION OF ARTERIOVENOUS GORETEX GRAFT: SHX6073

## 2019-08-02 LAB — POCT I-STAT 4, (NA,K, GLUC, HGB,HCT)
Glucose, Bld: 107 mg/dL — ABNORMAL HIGH (ref 70–99)
HCT: 45 % (ref 39.0–52.0)
Hemoglobin: 15.3 g/dL (ref 13.0–17.0)
Potassium: 3.9 mmol/L (ref 3.5–5.1)
Sodium: 137 mmol/L (ref 135–145)

## 2019-08-02 LAB — SARS CORONAVIRUS 2 BY RT PCR (HOSPITAL ORDER, PERFORMED IN ~~LOC~~ HOSPITAL LAB): SARS Coronavirus 2: NEGATIVE

## 2019-08-02 SURGERY — REVISION OF ARTERIOVENOUS GORETEX GRAFT
Anesthesia: General | Laterality: Left

## 2019-08-02 MED ORDER — SODIUM CHLORIDE 0.9 % IV SOLN
INTRAVENOUS | Status: DC
  Administered 2019-08-02: 08:00:00 via INTRAVENOUS

## 2019-08-02 MED ORDER — HYDROMORPHONE HCL 1 MG/ML IJ SOLN
INTRAMUSCULAR | Status: AC
  Filled 2019-08-02: qty 1

## 2019-08-02 MED ORDER — STERILE WATER FOR IRRIGATION IR SOLN
Status: DC | PRN
  Administered 2019-08-02: 1000 mL

## 2019-08-02 MED ORDER — CHLORHEXIDINE GLUCONATE 4 % EX LIQD: 60.0000 mL | Freq: Once | CUTANEOUS | Status: DC

## 2019-08-02 MED ORDER — DEXAMETHASONE SODIUM PHOSPHATE 10 MG/ML IJ SOLN
INTRAMUSCULAR | Status: DC | PRN
  Administered 2019-08-02: 4 mg via INTRAVENOUS

## 2019-08-02 MED ORDER — HYDROMORPHONE HCL 1 MG/ML IJ SOLN
0.2500 mg | INTRAMUSCULAR | Status: DC | PRN
  Administered 2019-08-02: 0.5 mg via INTRAVENOUS

## 2019-08-02 MED ORDER — MEPERIDINE HCL 25 MG/ML IJ SOLN: 6.2500 mg | INTRAMUSCULAR | Status: DC | PRN

## 2019-08-02 MED ORDER — SODIUM CHLORIDE 0.9 % IV SOLN
INTRAVENOUS | Status: DC | PRN
  Administered 2019-08-02: 150 ug/min via INTRAVENOUS

## 2019-08-02 MED ORDER — DEXAMETHASONE SODIUM PHOSPHATE 10 MG/ML IJ SOLN
INTRAMUSCULAR | Status: AC
  Filled 2019-08-02: qty 1

## 2019-08-02 MED ORDER — BUPIVACAINE HCL (PF) 0.25 % IJ SOLN
INTRAMUSCULAR | Status: AC
  Filled 2019-08-02: qty 360

## 2019-08-02 MED ORDER — HYDROCODONE-ACETAMINOPHEN 5-325 MG PO TABS: 1.0000 | ORAL_TABLET | Freq: Four times a day (QID) | ORAL | 0 refills | Status: DC | PRN

## 2019-08-02 MED ORDER — PROPOFOL 10 MG/ML IV BOLUS
INTRAVENOUS | Status: DC | PRN
  Administered 2019-08-02: 50 mg via INTRAVENOUS
  Administered 2019-08-02 (×2): 20 mg via INTRAVENOUS
  Administered 2019-08-02: 150 mg via INTRAVENOUS

## 2019-08-02 MED ORDER — PROTAMINE SULFATE 10 MG/ML IV SOLN
INTRAVENOUS | Status: AC
  Filled 2019-08-02: qty 5

## 2019-08-02 MED ORDER — PROPOFOL 500 MG/50ML IV EMUL
INTRAVENOUS | Status: DC | PRN
  Administered 2019-08-02: 50 ug/kg/min via INTRAVENOUS

## 2019-08-02 MED ORDER — SODIUM CHLORIDE 0.9 % IV SOLN
INTRAVENOUS | Status: AC
  Filled 2019-08-02: qty 1.2

## 2019-08-02 MED ORDER — SODIUM CHLORIDE 0.9 % IV SOLN
INTRAVENOUS | Status: DC | PRN
  Administered 2019-08-02: 500 mL

## 2019-08-02 MED ORDER — BUPIVACAINE HCL (PF) 0.25 % IJ SOLN
INTRAMUSCULAR | Status: DC | PRN
  Administered 2019-08-02: 9.5 mL

## 2019-08-02 MED ORDER — CEFAZOLIN SODIUM-DEXTROSE 2-4 GM/100ML-% IV SOLN
2.0000 g | INTRAVENOUS | Status: AC
  Administered 2019-08-02: 09:00:00 2 g via INTRAVENOUS
  Filled 2019-08-02: qty 100

## 2019-08-02 MED ORDER — PROPOFOL 10 MG/ML IV BOLUS
INTRAVENOUS | Status: AC
  Filled 2019-08-02: qty 20

## 2019-08-02 MED ORDER — ONDANSETRON HCL 4 MG/2ML IJ SOLN
INTRAMUSCULAR | Status: DC | PRN
  Administered 2019-08-02: 4 mg via INTRAVENOUS

## 2019-08-02 MED ORDER — DEXMEDETOMIDINE HCL IN NACL 200 MCG/50ML IV SOLN
INTRAVENOUS | Status: DC | PRN
  Administered 2019-08-02: 8 ug via INTRAVENOUS

## 2019-08-02 MED ORDER — ONDANSETRON HCL 4 MG/2ML IJ SOLN
INTRAMUSCULAR | Status: AC
  Filled 2019-08-02: qty 2

## 2019-08-02 MED ORDER — LIDOCAINE HCL (PF) 1 % IJ SOLN
INTRAMUSCULAR | Status: AC
  Filled 2019-08-02: qty 30

## 2019-08-02 MED ORDER — FENTANYL CITRATE (PF) 100 MCG/2ML IJ SOLN
INTRAMUSCULAR | Status: DC | PRN
  Administered 2019-08-02: 50 ug via INTRAVENOUS
  Administered 2019-08-02: 25 ug via INTRAVENOUS

## 2019-08-02 MED ORDER — MIDAZOLAM HCL 5 MG/5ML IJ SOLN
INTRAMUSCULAR | Status: DC | PRN
  Administered 2019-08-02: 2 mg via INTRAVENOUS

## 2019-08-02 MED ORDER — 0.9 % SODIUM CHLORIDE (POUR BTL) OPTIME
TOPICAL | Status: DC | PRN
  Administered 2019-08-02: 10:00:00 1000 mL

## 2019-08-02 MED ORDER — HEPARIN SODIUM (PORCINE) 1000 UNIT/ML IJ SOLN
INTRAMUSCULAR | Status: DC | PRN
  Administered 2019-08-02: 5000 [IU] via INTRAVENOUS

## 2019-08-02 MED ORDER — LIDOCAINE-EPINEPHRINE (PF) 1 %-1:200000 IJ SOLN
INTRAMUSCULAR | Status: AC
  Filled 2019-08-02: qty 90

## 2019-08-02 MED ORDER — FENTANYL CITRATE (PF) 250 MCG/5ML IJ SOLN
INTRAMUSCULAR | Status: AC
  Filled 2019-08-02: qty 5

## 2019-08-02 MED ORDER — ALBUMIN HUMAN 5 % IV SOLN
INTRAVENOUS | Status: DC | PRN
  Administered 2019-08-02: 10:00:00 via INTRAVENOUS

## 2019-08-02 MED ORDER — GLYCOPYRROLATE PF 0.2 MG/ML IJ SOSY
PREFILLED_SYRINGE | INTRAMUSCULAR | Status: DC | PRN
  Administered 2019-08-02: .2 mg via INTRAVENOUS

## 2019-08-02 MED ORDER — PROTAMINE SULFATE 10 MG/ML IV SOLN
INTRAVENOUS | Status: DC | PRN
  Administered 2019-08-02 (×4): 10 mg via INTRAVENOUS

## 2019-08-02 MED ORDER — MIDAZOLAM HCL 2 MG/2ML IJ SOLN
INTRAMUSCULAR | Status: AC
  Filled 2019-08-02: qty 2

## 2019-08-02 MED ORDER — PHENYLEPHRINE 40 MCG/ML (10ML) SYRINGE FOR IV PUSH (FOR BLOOD PRESSURE SUPPORT)
PREFILLED_SYRINGE | INTRAVENOUS | Status: DC | PRN
  Administered 2019-08-02 (×5): 80 ug via INTRAVENOUS

## 2019-08-02 MED ORDER — EPHEDRINE SULFATE-NACL 50-0.9 MG/10ML-% IV SOSY
PREFILLED_SYRINGE | INTRAVENOUS | Status: DC | PRN
  Administered 2019-08-02 (×5): 10 mg via INTRAVENOUS

## 2019-08-02 MED ORDER — LIDOCAINE-EPINEPHRINE (PF) 1 %-1:200000 IJ SOLN
INTRAMUSCULAR | Status: DC | PRN
  Administered 2019-08-02: 9.5 mL

## 2019-08-02 SURGICAL SUPPLY — 38 items
BNDG ELASTIC 4X5.8 VLCR STR LF (GAUZE/BANDAGES/DRESSINGS) ×2 IMPLANT
CANISTER SUCT 3000ML PPV (MISCELLANEOUS) ×2 IMPLANT
CANNULA VESSEL 3MM 2 BLNT TIP (CANNULA) ×2 IMPLANT
CLIP VESOCCLUDE MED 6/CT (CLIP) ×2 IMPLANT
CLIP VESOCCLUDE SM WIDE 6/CT (CLIP) ×2 IMPLANT
CONT SPEC 4OZ CLIKSEAL STRL BL (MISCELLANEOUS) ×2 IMPLANT
COVER WAND RF STERILE (DRAPES) IMPLANT
DECANTER SPIKE VIAL GLASS SM (MISCELLANEOUS) ×4 IMPLANT
DERMABOND ADVANCED (GAUZE/BANDAGES/DRESSINGS) ×1
DERMABOND ADVANCED .7 DNX12 (GAUZE/BANDAGES/DRESSINGS) ×1 IMPLANT
ELECT REM PT RETURN 9FT ADLT (ELECTROSURGICAL) ×2
ELECTRODE REM PT RTRN 9FT ADLT (ELECTROSURGICAL) ×1 IMPLANT
GAUZE SPONGE 4X4 12PLY STRL LF (GAUZE/BANDAGES/DRESSINGS) ×2 IMPLANT
GLOVE BIO SURGEON STRL SZ7.5 (GLOVE) ×4 IMPLANT
GLOVE BIOGEL PI IND STRL 6.5 (GLOVE) ×1 IMPLANT
GLOVE BIOGEL PI IND STRL 7.0 (GLOVE) ×1 IMPLANT
GLOVE BIOGEL PI INDICATOR 6.5 (GLOVE) ×1
GLOVE BIOGEL PI INDICATOR 7.0 (GLOVE) ×1
GLOVE SURG SS PI 6.5 STRL IVOR (GLOVE) ×4 IMPLANT
GOWN STRL NON-REIN LRG LVL3 (GOWN DISPOSABLE) ×2 IMPLANT
GOWN STRL REUS W/ TWL LRG LVL3 (GOWN DISPOSABLE) ×3 IMPLANT
GOWN STRL REUS W/TWL LRG LVL3 (GOWN DISPOSABLE) ×3
KIT BASIN OR (CUSTOM PROCEDURE TRAY) ×2 IMPLANT
KIT TURNOVER KIT B (KITS) ×2 IMPLANT
LOOP VESSEL MINI RED (MISCELLANEOUS) IMPLANT
NEEDLE HYPO 25GX1X1/2 BEV (NEEDLE) ×2 IMPLANT
NS IRRIG 1000ML POUR BTL (IV SOLUTION) ×2 IMPLANT
PACK CV ACCESS (CUSTOM PROCEDURE TRAY) ×2 IMPLANT
PAD ARMBOARD 7.5X6 YLW CONV (MISCELLANEOUS) ×4 IMPLANT
SPONGE SURGIFOAM ABS GEL 100 (HEMOSTASIS) IMPLANT
SUT PROLENE 5 0 C 1 24 (SUTURE) ×6 IMPLANT
SUT PROLENE 6 0 CC (SUTURE) ×2 IMPLANT
SUT VIC AB 3-0 SH 27 (SUTURE) ×1
SUT VIC AB 3-0 SH 27X BRD (SUTURE) ×1 IMPLANT
SUT VICRYL 4-0 PS2 18IN ABS (SUTURE) ×4 IMPLANT
TOWEL GREEN STERILE (TOWEL DISPOSABLE) ×2 IMPLANT
UNDERPAD 30X30 (UNDERPADS AND DIAPERS) ×2 IMPLANT
WATER STERILE IRR 1000ML POUR (IV SOLUTION) ×2 IMPLANT

## 2019-08-02 NOTE — Discharge Instructions (Signed)
° °  [facility] ° °Discharge Instructions ° °AV Fistula or Graft Surgery for Dialysis Access ° °Please refer to the following instructions for your post-procedure care. Your surgeon or physician assistant will discuss any changes with you. ° °Activity ° °You may drive the day following your surgery, if you are comfortable and no longer taking prescription pain medication. Resume full activity as the soreness in your incision resolves. ° °Bathing/Showering ° °You may shower after you go home. Keep your incision dry for 48 hours. Do not soak in a bathtub, hot tub, or swim until the incision heals completely. You may not shower if you have a hemodialysis catheter. ° °Incision Care ° °Clean your incision with mild soap and water after 48 hours. Pat the area dry with a clean towel. You do not need a bandage unless otherwise instructed. Do not apply any ointments or creams to your incision. You may have skin glue on your incision. Do not peel it off. It will come off on its own in about one week. Your arm may swell a bit after surgery. To reduce swelling use pillows to elevate your arm so it is above your heart. Your doctor will tell you if you need to lightly wrap your arm with an ACE bandage. ° °Diet ° °Resume your normal diet. There are not special food restrictions following this procedure. In order to heal from your surgery, it is CRITICAL to get adequate nutrition. Your body requires vitamins, minerals, and protein. Vegetables are the best source of vitamins and minerals. Vegetables also provide the perfect balance of protein. Processed food has little nutritional value, so try to avoid this. ° °Medications ° °Resume taking all of your medications. If your incision is causing pain, you may take over-the counter pain relievers such as acetaminophen (Tylenol). If you were prescribed a stronger pain medication, please be aware these medications can cause nausea and constipation. Prevent  nausea by taking the medication with a snack or meal. Avoid constipation by drinking plenty of fluids and eating foods with high amount of fiber, such as fruits, vegetables, and grains. Do not take Tylenol if you are taking prescription pain medications. ° ° ° ° °Follow up °Your surgeon may want to see you in the office following your access surgery. If so, this will be arranged at the time of your surgery. ° °Please call us immediately for any of the following conditions: ° °Increased pain, redness, drainage (pus) from your incision site °Fever of 101 degrees or higher °Severe or worsening pain at your incision site °Hand pain or numbness. ° °Reduce your risk of vascular disease: ° °Stop smoking. If you would like help, call [facility] at 1-800-QUIT-NOW (1-800-784-8669) or [facility] at 336-586-4000 ° °Manage your cholesterol °Maintain a desired weight °Control your diabetes °Keep your blood pressure down ° °Dialysis ° °It will take several weeks to several months for your new dialysis access to be ready for use. Your surgeon will determine when it is OK to use it. Your nephrologist will continue to direct your dialysis. You can continue to use your Permcath until your new access is ready for use. ° °If you have any questions, please call the office at [phone]. ° °

## 2019-08-02 NOTE — Anesthesia Postprocedure Evaluation (Signed)
Anesthesia Post Note  Patient: Keokea Shellhammer  Procedure(s) Performed: PLICATION OF ARTERIOVENOUS FISTULA LEFT ARM (Left )     Patient location during evaluation: PACU Anesthesia Type: General Level of consciousness: awake and alert Pain management: pain level controlled Vital Signs Assessment: post-procedure vital signs reviewed and stable Respiratory status: spontaneous breathing, nonlabored ventilation, respiratory function stable and patient connected to nasal cannula oxygen Cardiovascular status: blood pressure returned to baseline and stable Postop Assessment: no apparent nausea or vomiting Anesthetic complications: no    Last Vitals:  Vitals:   08/02/19 1145 08/02/19 1200  BP: 92/69 98/67  Pulse: (!) 59 (!) 57  Resp: 10 18  Temp:    SpO2: 100% 94%    Last Pain:  Vitals:   08/02/19 1200  TempSrc:   PainSc: 0-No pain                 Kiondre Grenz DAVID

## 2019-08-02 NOTE — Op Note (Signed)
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  The patient placed supine position operating table.  After induction of general anesthesia and placement of a laryngeal mask, patient's entire left upper extremities prepped and draped in usual sterile fashion.  Local anesthesia in the form of 1% lidocaine with epinephrine in addition to quarter percent Marcaine was infiltrated around the area of the midportion of the fistula.  An elliptical incision was then made encompassing the eroded skin and the aneurysmal portion of the fistula over about a 7 cm segment in the central portion of the fistula.  Incision was carried on through subcutaneous tissues down the level of fistula.  The fistula was dissected free circumferentially.  Patient was given 5000 inch of intravenous heparin.  Fistula clamps were used to control the fistula proximally and distally.  The fistula was opened longitudinally and the redundant skin and aneurysmal anterior wall resected.  The fistula was then repaired with a running 5-0 Prolene suture.  Despite completion anastomosis everything was opened up and there was good hemostasis except for 2 areas where a repair stitch was placed.  At this point there was hemostasis.  The patient was given 50 mg of protamine.  The subcutaneous tissues were reapproximated using a running 3-0 Vicryl suture.  The skin was closed with a 4-0 Vicryl subcuticular stitch.  The patient tolerated procedure well and there were no complications.  The instrument sponge needle count was correct end of the case.  The patient was taken the recovery room in stable condition.  Operative management: The patient's fistula he is  ready for use proximal and distal to the area of incision.  The area of incision should not be cannulated for 1 month.  Ruta Hinds, MD [facility] Office: [phone] Pager: [phone]

## 2019-08-02 NOTE — Anesthesia Procedure Notes (Signed)
Procedure Name: LMA Insertion Date/Time: 08/02/2019 9:45 AM Performed by: Renato Shin, CRNA Pre-anesthesia Checklist: Patient identified, Emergency Drugs available, Suction available and Patient being monitored Patient Re-evaluated:Patient Re-evaluated prior to induction Oxygen Delivery Method: Circle system utilized Preoxygenation: Pre-oxygenation with 100% oxygen Induction Type: IV induction LMA: LMA inserted LMA Size: 5.0 Number of attempts: 1 Placement Confirmation: positive ETCO2 and breath sounds checked- equal and bilateral Tube secured with: Tape Dental Injury: Teeth and Oropharynx as per pre-operative assessment

## 2019-08-02 NOTE — Interval H&P Note (Signed)
History and Physical Interval Note:  08/02/2019 10:53 AM  [name] [name]  has presented today for surgery, with the diagnosis of COMPLICATION OF ARTERIOVENOUS FISTULA LEFT ARM.  The various methods of treatment have been discussed with the patient and family. After consideration of risks, benefits and other options for treatment, the patient has consented to  Procedure(s): PLICATION OF ARTERIOVENOUS FISTULA LEFT ARM (Left) as a surgical intervention.  The patient's history has been reviewed, patient examined, no change in status, stable for surgery.  I have reviewed the patient's chart and labs.  Questions were answered to the patient's satisfaction.     Ruta Hinds

## 2019-08-02 NOTE — Transfer of Care (Signed)
Immediate Anesthesia Transfer of Care Note  Patient: Finlay Godbee  Procedure(s) Performed: PLICATION OF ARTERIOVENOUS FISTULA LEFT ARM (Left )  Patient Location: PACU  Anesthesia Type:General  Level of Consciousness: awake and drowsy  Airway & Oxygen Therapy: Patient Spontanous Breathing and Patient connected to face mask oxygen  Post-op Assessment: Report given to RN and Post -op Vital signs reviewed and stable  Post vital signs: Reviewed and stable  Last Vitals:  Vitals Value Taken Time  BP 99/67 08/02/19 1059  Temp    Pulse 56 08/02/19 1100  Resp 14 08/02/19 1100  SpO2 98 % 08/02/19 1100  Vitals shown include unvalidated device data.  Last Pain:  Vitals:   08/02/19 0738  TempSrc:   PainSc: 0-No pain         Complications: No apparent anesthesia complications

## 2019-08-02 NOTE — Anesthesia Preprocedure Evaluation (Signed)
Anesthesia Evaluation  Patient identified by MRN, date of birth, ID band Patient awake    Reviewed: Allergy & Precautions, NPO status , Patient's Chart, lab work & pertinent test results  Airway Mallampati: I  TM Distance: >3 FB Neck ROM: Full    Dental   Pulmonary former smoker,    Pulmonary exam normal        Cardiovascular hypertension, Pt. on medications Normal cardiovascular exam     Neuro/Psych    GI/Hepatic   Endo/Other    Renal/GU Dialysis and ESRFRenal disease     Musculoskeletal   Abdominal   Peds  Hematology   Anesthesia Other Findings   Reproductive/Obstetrics                             Anesthesia Physical Anesthesia Plan  ASA: III  Anesthesia Plan: General   Post-op Pain Management:    Induction: Intravenous  PONV Risk Score and Plan: 2  Airway Management Planned: LMA  Additional Equipment:   Intra-op Plan:   Post-operative Plan: Extubation in OR  Informed Consent: I have reviewed the patients History and Physical, chart, labs and discussed the procedure including the risks, benefits and alternatives for the proposed anesthesia with the patient or authorized representative who has indicated his/her understanding and acceptance.       Plan Discussed with: CRNA and Surgeon  Anesthesia Plan Comments:         Anesthesia Quick Evaluation

## 2019-08-03 ENCOUNTER — Encounter (HOSPITAL_COMMUNITY): Payer: Self-pay | Admitting: Vascular Surgery

## 2019-08-03 DIAGNOSIS — D509 Iron deficiency anemia, unspecified: Secondary | ICD-10-CM | POA: Diagnosis not present

## 2019-08-03 DIAGNOSIS — N2581 Secondary hyperparathyroidism of renal origin: Secondary | ICD-10-CM | POA: Diagnosis not present

## 2019-08-03 DIAGNOSIS — N186 End stage renal disease: Secondary | ICD-10-CM | POA: Diagnosis not present

## 2019-08-03 DIAGNOSIS — Z992 Dependence on renal dialysis: Secondary | ICD-10-CM | POA: Diagnosis not present

## 2019-08-05 DIAGNOSIS — D509 Iron deficiency anemia, unspecified: Secondary | ICD-10-CM | POA: Diagnosis not present

## 2019-08-05 DIAGNOSIS — N2581 Secondary hyperparathyroidism of renal origin: Secondary | ICD-10-CM | POA: Diagnosis not present

## 2019-08-05 DIAGNOSIS — Z992 Dependence on renal dialysis: Secondary | ICD-10-CM | POA: Diagnosis not present

## 2019-08-05 DIAGNOSIS — N186 End stage renal disease: Secondary | ICD-10-CM | POA: Diagnosis not present

## 2019-08-08 DIAGNOSIS — N2581 Secondary hyperparathyroidism of renal origin: Secondary | ICD-10-CM | POA: Diagnosis not present

## 2019-08-08 DIAGNOSIS — N186 End stage renal disease: Secondary | ICD-10-CM | POA: Diagnosis not present

## 2019-08-08 DIAGNOSIS — Z992 Dependence on renal dialysis: Secondary | ICD-10-CM | POA: Diagnosis not present

## 2019-08-08 DIAGNOSIS — D509 Iron deficiency anemia, unspecified: Secondary | ICD-10-CM | POA: Diagnosis not present

## 2019-08-12 DIAGNOSIS — N186 End stage renal disease: Secondary | ICD-10-CM | POA: Diagnosis not present

## 2019-08-12 DIAGNOSIS — Z992 Dependence on renal dialysis: Secondary | ICD-10-CM | POA: Diagnosis not present

## 2019-08-12 DIAGNOSIS — D509 Iron deficiency anemia, unspecified: Secondary | ICD-10-CM | POA: Diagnosis not present

## 2019-08-12 DIAGNOSIS — N2581 Secondary hyperparathyroidism of renal origin: Secondary | ICD-10-CM | POA: Diagnosis not present

## 2019-08-15 DIAGNOSIS — D509 Iron deficiency anemia, unspecified: Secondary | ICD-10-CM | POA: Diagnosis not present

## 2019-08-15 DIAGNOSIS — Z992 Dependence on renal dialysis: Secondary | ICD-10-CM | POA: Diagnosis not present

## 2019-08-15 DIAGNOSIS — N2581 Secondary hyperparathyroidism of renal origin: Secondary | ICD-10-CM | POA: Diagnosis not present

## 2019-08-15 DIAGNOSIS — N186 End stage renal disease: Secondary | ICD-10-CM | POA: Diagnosis not present

## 2019-08-16 DIAGNOSIS — N186 End stage renal disease: Secondary | ICD-10-CM | POA: Diagnosis not present

## 2019-08-16 DIAGNOSIS — I120 Hypertensive chronic kidney disease with stage 5 chronic kidney disease or end stage renal disease: Secondary | ICD-10-CM | POA: Diagnosis not present

## 2019-08-16 DIAGNOSIS — Z992 Dependence on renal dialysis: Secondary | ICD-10-CM | POA: Diagnosis not present

## 2019-08-17 ENCOUNTER — Ambulatory Visit: Payer: Medicare Other | Admitting: Family

## 2019-08-22 DIAGNOSIS — Z992 Dependence on renal dialysis: Secondary | ICD-10-CM | POA: Diagnosis not present

## 2019-08-22 DIAGNOSIS — L089 Local infection of the skin and subcutaneous tissue, unspecified: Secondary | ICD-10-CM | POA: Diagnosis not present

## 2019-08-22 DIAGNOSIS — N186 End stage renal disease: Secondary | ICD-10-CM | POA: Diagnosis not present

## 2019-08-22 DIAGNOSIS — N2581 Secondary hyperparathyroidism of renal origin: Secondary | ICD-10-CM | POA: Diagnosis not present

## 2019-08-22 DIAGNOSIS — D509 Iron deficiency anemia, unspecified: Secondary | ICD-10-CM | POA: Diagnosis not present

## 2019-08-24 DIAGNOSIS — N186 End stage renal disease: Secondary | ICD-10-CM | POA: Diagnosis not present

## 2019-08-24 DIAGNOSIS — L089 Local infection of the skin and subcutaneous tissue, unspecified: Secondary | ICD-10-CM | POA: Diagnosis not present

## 2019-08-24 DIAGNOSIS — Z992 Dependence on renal dialysis: Secondary | ICD-10-CM | POA: Diagnosis not present

## 2019-08-24 DIAGNOSIS — D509 Iron deficiency anemia, unspecified: Secondary | ICD-10-CM | POA: Diagnosis not present

## 2019-08-24 DIAGNOSIS — N2581 Secondary hyperparathyroidism of renal origin: Secondary | ICD-10-CM | POA: Diagnosis not present

## 2019-08-26 DIAGNOSIS — N2581 Secondary hyperparathyroidism of renal origin: Secondary | ICD-10-CM | POA: Diagnosis not present

## 2019-08-26 DIAGNOSIS — Z992 Dependence on renal dialysis: Secondary | ICD-10-CM | POA: Diagnosis not present

## 2019-08-26 DIAGNOSIS — L089 Local infection of the skin and subcutaneous tissue, unspecified: Secondary | ICD-10-CM | POA: Diagnosis not present

## 2019-08-26 DIAGNOSIS — D509 Iron deficiency anemia, unspecified: Secondary | ICD-10-CM | POA: Diagnosis not present

## 2019-08-26 DIAGNOSIS — N186 End stage renal disease: Secondary | ICD-10-CM | POA: Diagnosis not present

## 2019-08-29 DIAGNOSIS — N2581 Secondary hyperparathyroidism of renal origin: Secondary | ICD-10-CM | POA: Diagnosis not present

## 2019-08-29 DIAGNOSIS — D509 Iron deficiency anemia, unspecified: Secondary | ICD-10-CM | POA: Diagnosis not present

## 2019-08-29 DIAGNOSIS — L089 Local infection of the skin and subcutaneous tissue, unspecified: Secondary | ICD-10-CM | POA: Diagnosis not present

## 2019-08-29 DIAGNOSIS — Z992 Dependence on renal dialysis: Secondary | ICD-10-CM | POA: Diagnosis not present

## 2019-08-29 DIAGNOSIS — N186 End stage renal disease: Secondary | ICD-10-CM | POA: Diagnosis not present

## 2019-09-02 DIAGNOSIS — Z992 Dependence on renal dialysis: Secondary | ICD-10-CM | POA: Diagnosis not present

## 2019-09-02 DIAGNOSIS — L089 Local infection of the skin and subcutaneous tissue, unspecified: Secondary | ICD-10-CM | POA: Diagnosis not present

## 2019-09-02 DIAGNOSIS — N186 End stage renal disease: Secondary | ICD-10-CM | POA: Diagnosis not present

## 2019-09-02 DIAGNOSIS — N2581 Secondary hyperparathyroidism of renal origin: Secondary | ICD-10-CM | POA: Diagnosis not present

## 2019-09-02 DIAGNOSIS — D509 Iron deficiency anemia, unspecified: Secondary | ICD-10-CM | POA: Diagnosis not present

## 2019-09-05 ENCOUNTER — Other Ambulatory Visit: Payer: Self-pay

## 2019-09-05 ENCOUNTER — Emergency Department (HOSPITAL_COMMUNITY)
Admission: EM | Admit: 2019-09-05 | Discharge: 2019-09-05 | Disposition: A | Discharge status: Home / Self Care | Payer: Medicare Other | Attending: Emergency Medicine | Admitting: Emergency Medicine

## 2019-09-05 ENCOUNTER — Encounter (HOSPITAL_COMMUNITY): Payer: Self-pay | Admitting: Emergency Medicine

## 2019-09-05 DIAGNOSIS — M79622 Pain in left upper arm: Secondary | ICD-10-CM | POA: Diagnosis present

## 2019-09-05 DIAGNOSIS — I132 Hypertensive heart and chronic kidney disease with heart failure and with stage 5 chronic kidney disease, or end stage renal disease: Secondary | ICD-10-CM | POA: Insufficient documentation

## 2019-09-05 DIAGNOSIS — N2581 Secondary hyperparathyroidism of renal origin: Secondary | ICD-10-CM | POA: Diagnosis not present

## 2019-09-05 DIAGNOSIS — N186 End stage renal disease: Secondary | ICD-10-CM | POA: Diagnosis not present

## 2019-09-05 DIAGNOSIS — Z79899 Other long term (current) drug therapy: Secondary | ICD-10-CM | POA: Insufficient documentation

## 2019-09-05 DIAGNOSIS — L089 Local infection of the skin and subcutaneous tissue, unspecified: Secondary | ICD-10-CM | POA: Diagnosis not present

## 2019-09-05 DIAGNOSIS — I5042 Chronic combined systolic (congestive) and diastolic (congestive) heart failure: Secondary | ICD-10-CM | POA: Insufficient documentation

## 2019-09-05 DIAGNOSIS — Z992 Dependence on renal dialysis: Secondary | ICD-10-CM | POA: Insufficient documentation

## 2019-09-05 DIAGNOSIS — Z87891 Personal history of nicotine dependence: Secondary | ICD-10-CM | POA: Diagnosis not present

## 2019-09-05 DIAGNOSIS — D509 Iron deficiency anemia, unspecified: Secondary | ICD-10-CM | POA: Diagnosis not present

## 2019-09-05 LAB — CBC WITH DIFFERENTIAL/PLATELET
Abs Immature Granulocytes: 0.01 10*3/uL (ref 0.00–0.07)
Basophils Absolute: 0 10*3/uL (ref 0.0–0.1)
Basophils Relative: 1 %
Eosinophils Absolute: 0.1 10*3/uL (ref 0.0–0.5)
Eosinophils Relative: 3 %
HCT: 37 % — ABNORMAL LOW (ref 39.0–52.0)
Hemoglobin: 12.4 g/dL — ABNORMAL LOW (ref 13.0–17.0)
Immature Granulocytes: 0 %
Lymphocytes Relative: 17 %
Lymphs Abs: 0.7 10*3/uL (ref 0.7–4.0)
MCH: 31.6 pg (ref 26.0–34.0)
MCHC: 33.5 g/dL (ref 30.0–36.0)
MCV: 94.4 fL (ref 80.0–100.0)
Monocytes Absolute: 0.6 10*3/uL (ref 0.1–1.0)
Monocytes Relative: 15 %
Neutro Abs: 2.7 10*3/uL (ref 1.7–7.7)
Neutrophils Relative %: 64 %
Platelets: 126 10*3/uL — ABNORMAL LOW (ref 150–400)
RBC: 3.92 MIL/uL — ABNORMAL LOW (ref 4.22–5.81)
RDW: 15.4 % (ref 11.5–15.5)
WBC: 4.2 10*3/uL (ref 4.0–10.5)
nRBC: 0 % (ref 0.0–0.2)

## 2019-09-05 LAB — COMPREHENSIVE METABOLIC PANEL
ALT: 8 U/L (ref 0–44)
AST: 15 U/L (ref 15–41)
Albumin: 4 g/dL (ref 3.5–5.0)
Alkaline Phosphatase: 81 U/L (ref 38–126)
Anion gap: 18 — ABNORMAL HIGH (ref 5–15)
BUN: 28 mg/dL — ABNORMAL HIGH (ref 6–20)
CO2: 27 mmol/L (ref 22–32)
Calcium: 8.1 mg/dL — ABNORMAL LOW (ref 8.9–10.3)
Chloride: 93 mmol/L — ABNORMAL LOW (ref 98–111)
Creatinine, Ser: 6.79 mg/dL — ABNORMAL HIGH (ref 0.61–1.24)
GFR calc Af Amer: 10 mL/min — ABNORMAL LOW (ref >60)
GFR calc non Af Amer: 9 mL/min — ABNORMAL LOW (ref >60)
Glucose, Bld: 78 mg/dL (ref 70–99)
Potassium: 4 mmol/L (ref 3.5–5.1)
Sodium: 138 mmol/L (ref 135–145)
Total Bilirubin: 1.5 mg/dL — ABNORMAL HIGH (ref 0.3–1.2)
Total Protein: 8.1 g/dL (ref 6.5–8.1)

## 2019-09-05 LAB — LACTIC ACID, PLASMA: Lactic Acid, Venous: 1.3 mmol/L (ref 0.5–1.9)

## 2019-09-05 MED ORDER — CLINDAMYCIN HCL 150 MG PO CAPS
300.0000 mg | ORAL_CAPSULE | Freq: Once | ORAL | Status: AC
  Administered 2019-09-05: 300 mg via ORAL
  Filled 2019-09-05: qty 2

## 2019-09-05 MED ORDER — CLINDAMYCIN HCL 150 MG PO CAPS: 300.0000 mg | ORAL_CAPSULE | Freq: Three times a day (TID) | ORAL | 0 refills | Status: AC

## 2019-09-05 MED ORDER — SODIUM CHLORIDE 0.9% FLUSH: 3.0000 mL | Freq: Once | INTRAVENOUS | Status: DC

## 2019-09-05 NOTE — ED Notes (Signed)
Pt called for room x2 with no answer

## 2019-09-05 NOTE — ED Triage Notes (Signed)
Pt presents with left upper arm graft site draining pus. He reports completing dialysis today but continues to have pain in the site. Denies any recent trauma, fever, or chills. NAD at triage.

## 2019-09-05 NOTE — ED Notes (Signed)
Vascular surgeon at bedside.

## 2019-09-05 NOTE — Consult Note (Signed)
Hospital Consult    Reason for Consult:  Concern for left arm AVF infection Referring Physician:  ED MRN #:  [id number]  History of Present Illness: This is a 53 y.o. male with multiple medical problems including end-stage renal disease on hemodialysis Monday Wednesday Friday who vascular surgery has been consulted with concern for left arm AV fistula infection.  Patient's has had multiple left arm accesses including a left first stage brachial vein transposition by Dr. Bridgett Larsson in 2633, then a basilic vein transposition in 2013.  Subsequently had 3 primary fistula plications in 3545, 6256, 2020.  States he has never had any prosthetic or other graft placed in his left arm.  He last underwent a left AV fistula plication on 3/89/3734 by Dr. Oneida Alar.  States he went to dialysis today and they noticed at the very proximal portion of the incision on his upper arm there was some purulent drainage that prompted his referral to the ED.  No fevers or chills.  Fistula is working ok when they stick away from it.  Past Medical History:  Diagnosis Date  . Abdominal distension 08/10/2018  . Alcohol abuse 11/26/2010   Qualifier: Diagnosis of  By: [name] MD, Pershing Proud  . Anemia due to blood loss, acute 07/15/2012  . CHF (congestive heart failure) ([place])   . Cholecystitis 04/18/2014  . Cigarette smoker    has currently quit  . Cocaine abuse ([place]) 11/26/2010   Qualifier: Diagnosis of  By: [name] MD, Benjamine Mola  has been over a year  . 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 Echo 8/13: Severe LVH, EF 40-45%, inferoposterior akinesis, grade 2 diastolic dysfunction, moderate LAE, mild RVE, mildly reduced RVSF, mild RAE; cannot rule out R atrial mass-suggest TEE or cardiac MRI  //  B. Echo 3/17: Mild LVH, EF 25-30%, diffuse HK, grade 2 diastolic dysfunction, mild MR, severe LAE,  moderately reduced RVSF, severe RAE, mild TR, moderate PI, PASP 55 mmHg    . Dyspnea   .  ESRD (end stage renal disease) on dialysis [facility])    MWF- [place] (05/18/2017)  . Hemodialysis patient ([place])    M,W,F  . Hernia, inguinal, right 08/05/2016  . Hypertension   . 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 Hypocalcemia 07/15/2012  . Medical non-compliance   . Non-ischemic cardiomyopathy ([place])    A. R/L HC 3/17: Normal coronary arteries, moderate pulmonary hypertension (PASP 65 mmHg), elevated LV filling pressures (LVEDP 45 mmHg)   . NSVT (nonsustained ventricular tachycardia) ([place]) 02/22/2016  . NSVT (nonsustained ventricular tachycardia) ([place]) 02/22/2016  . Pneumonia 11/12/2017  . Polysubstance abuse ([place])   . Prolonged Q-T interval on ECG 05/18/2017  . Prolonged QT interval 05/18/2017  . Restless leg syndrome   . Solitary pulmonary nodule 10/10/2015   See cxr 10/09/2015 - CT rec 10/10/2015 >>>   . Upper airway cough syndrome 10/09/2015   Off ACEi around 1st Oct 2016  - Sinus CT 10/10/2015 >>>      Past Surgical History:  Procedure Laterality Date  . [place] TRANSPOSITION  01/19/2013   Procedure: BASCILIC VEIN TRANSPOSITION;  Surgeon: Conrad [place], MD;  Location: [facility];  Service: Vascular;  Laterality: Left;  left 2nd stage basilic vein transposition  . CARDIAC CATHETERIZATION N/A 02/25/2016   Procedure: Right/Left Heart Cath and Coronary Angiography;  Surgeon: Burnell Blanks,  MD;  Location: [facility];  Service: Cardiovascular;  Laterality: N/A;  . INGUINAL HERNIA REPAIR Right 08/05/2016   Procedure: RIGHT INGUINAL HERNIA REPAIR WITH MESH;  Surgeon: Donnie Mesa, MD;  Location: [facility] [place];  Service: General;  Laterality: Right;  . INSERTION OF DIALYSIS CATHETER  07/17/2012   Procedure: INSERTION OF DIALYSIS CATHETER;  Surgeon: Conrad Fulton, MD;  Location: [facility];  Service: Vascular;  Laterality: Right;  right internal jugular  . INSERTION OF  MESH Right 08/05/2016   Procedure: INSERTION OF MESH;  Surgeon: Donnie Mesa, MD;  Location: [facility];  Service: General;  Laterality: Right;  . REVISION OF ARTERIOVENOUS GORETEX GRAFT Left 01/04/9757   Procedure: PLICATION OF ARTERIOVENOUS FISTULA LEFT ARM;  Surgeon: Elam Dutch, MD;  Location: [facility];  Service: Vascular;  Laterality: Left;  . REVISON OF ARTERIOVENOUS FISTULA Left 01/06/2017   Procedure: REVISON OF ARTERIOVENOUS FISTULA;  Surgeon: Conrad Camino, MD;  Location: [facility];  Service: Vascular;  Laterality: Left;  . REVISON OF ARTERIOVENOUS FISTULA Left 10/27/2018   Procedure: REVISION PLICATION OF ARTERIOVENOUS FISTULA  LEFT UPPER ARM;  Surgeon: Waynetta Sandy, MD;  Location: [facility];  Service: Vascular;  Laterality: Left;  . UMBILICAL HERNIA REPAIR N/A 08/05/2016   Procedure: [place];  Surgeon: Donnie Mesa, MD;  Location: [place];  Service: General;  Laterality: N/A;  . VENOGRAM N/A 08/09/2012   Procedure: VENOGRAM;  Surgeon: Conrad Parker, MD;  Location[facility] C[facility] LAB;  Service: Cardiovascular;  Laterality: N/A;    Allergies  Allergen Reactions  . Losartan Cough    Prior to Admission medications   Medication Sig Start Date End Date Taking? Authorizing Provider  calcium acetate (PHOSLO) 667 MG capsule Take 3 capsules (2,001 mg total) by mouth 3 (three) times daily with meals. Patient taking differently: Take 1,334-3,335 mg by mouth See admin instructions. Take 3-5 capsules (2001 - 3335 mg) by mouth 3 times daily with large meals, take 2 capsules (1334 mg) with snacks 04/21/14   Mikhail, Velta Addison, DO  carvedilol (COREG) 25 MG tablet Take 1 tablet (25 mg total) by mouth 2 (two) times daily. 05/20/17   Geradine Girt, DO  clonazePAM (KLONOPIN) 0.5 MG tablet Take 1 tablet (0.5 mg total) by mouth as needed. Patient taking differently: Take 0.5 mg by mouth every Monday, Wednesday, and Friday with hemodialysis.  09/17/18   Jean Rosenthal, MD   HYDROcodone-acetaminophen (NORCO) 5-325 MG tablet Take 1 tablet by mouth every 6 (six) hours as needed for moderate pain. 08/02/19   Dagoberto Ligas, PA-C  isosorbide mononitrate (IMDUR) 60 MG 24 hr tablet Take 1 tablet (60 mg total) by mouth daily. 05/20/17   Geradine Girt, DO    Social History   Socioeconomic History  . Marital status: Single    Spouse name: Not on file  . Number of children: Not on file  . Years of education: Not on file  . Highest education level: Not on file  Occupational History  . Not on file  Social Needs  . Financial resource strain: Not on file  . Food insecurity    Worry: Not on file    Inability: Not on file  . Transportation needs    Medical: Not on file    Non-medical: Not on file  Tobacco Use  . Smoking status: Former Smoker    Years: 30.00    Types: Cigarettes    Quit date: 10/20/2018    Years  since quitting: 0.8  . Smokeless tobacco: Never Used  Substance and Sexual Activity  . Alcohol use: Yes    Alcohol/week: 4.0 standard drinks    Types: 4 Cans of beer per week  . Drug use: No    Types: Marijuana    Comment: 05/18/2017 "qd"  . Sexual activity: Not Currently  Lifestyle  . Physical activity    Days per week: Not on file    Minutes per session: Not on file  . Stress: Not on file  Relationships  . Social Herbalist on phone: Not on file    Gets together: Not on file    Attends religious service: Not on file    Active member of club or organization: Not on file    Attends meetings of clubs or organizations: Not on file    Relationship status: Not on file  . Intimate partner violence    Fear of current or ex partner: Not on file    Emotionally abused: Not on file    Physically abused: Not on file    Forced sexual activity: Not on file  Other Topics Concern  . Not on file  Social History Narrative  . Not on file     Family History  Problem Relation Age of Onset  . Hypertension Mother   . Diabetes Mother   . Renal  Disease Mother   . Hypertension Father     ROS: [x]  Positive   [ ]  Negative   [ ]  All sytems reviewed and are negative  Cardiovascular: []  chest pain/pressure []  palpitations []  SOB lying flat []  DOE []  pain in legs while walking []  pain in legs at rest []  pain in legs at night []  non-healing ulcers []  hx of DVT []  swelling in legs  Pulmonary: []  productive cough []  asthma/wheezing []  home O2  Neurologic: []  weakness in []  arms []  legs []  numbness in []  arms []  legs []  hx of CVA []  mini stroke [] difficulty speaking or slurred speech []  temporary loss of vision in one eye []  dizziness  Hematologic: []  hx of cancer []  bleeding problems []  problems with blood clotting easily  Endocrine:   []  diabetes []  thyroid disease  GI []  vomiting blood []  blood in stool  GU: []  CKD/renal failure []  HD--[]  M/W/F or []  T/T/S []  burning with urination []  blood in urine  Psychiatric: []  anxiety []  depression  Musculoskeletal: []  arthritis []  joint pain  Integumentary: []  rashes []  ulcers  Constitutional: []  fever []  chills   Physical Examination  Vitals:   09/05/19 1512 09/05/19 1742  BP: 107/77 119/79  Pulse: 64 62  Resp: 16 16  Temp: 98.4 F (36.9 C) 98.3 F (36.8 C)  SpO2: 97% 99%   There is no height or weight on file to calculate BMI.  General:  WDWN in NAD Gait: Not observed HENT: WNL, normocephalic Vascular Exam/Pulses: Left radial pulse palpable Left arm fistula good thrill Left arm incision healing as pictured below except for most proximal extent with pinpoint purulent expression, no fluctuance, no appreciable abscess       CBC    Component Value Date/Time   WBC 4.2 09/05/2019 1212   RBC 3.92 (L) 09/05/2019 1212   HGB 12.4 (L) 09/05/2019 1212   HGB 12.3 (L) 12/31/2017 1012   HCT 37.0 (L) 09/05/2019 1212   HCT 37.4 (L) 12/31/2017 1012   PLT 126 (L) 09/05/2019 1212   PLT 176 12/31/2017 1012   MCV 94.4  09/05/2019 1212   MCV 99  (H) 12/31/2017 1012   MCH 31.6 09/05/2019 1212   MCHC 33.5 09/05/2019 1212   RDW 15.4 09/05/2019 1212   RDW 18.8 (H) 12/31/2017 1012   LYMPHSABS 0.7 09/05/2019 1212   LYMPHSABS 1.0 12/31/2017 1012   MONOABS 0.6 09/05/2019 1212   EOSABS 0.1 09/05/2019 1212   EOSABS 0.1 12/31/2017 1012   BASOSABS 0.0 09/05/2019 1212   BASOSABS 0.0 12/31/2017 1012    BMET    Component Value Date/Time   NA 138 09/05/2019 1212   K 4.0 09/05/2019 1212   CL 93 (L) 09/05/2019 1212   CO2 27 09/05/2019 1212   GLUCOSE 78 09/05/2019 1212   BUN 28 (H) 09/05/2019 1212   CREATININE 6.79 (H) 09/05/2019 1212   CALCIUM 8.1 (L) 09/05/2019 1212   CALCIUM 8.2 (L) 10/21/2010 1420   GFRNONAA 9 (L) 09/05/2019 1212   GFRAA 10 (L) 09/05/2019 1212    COAGS: Lab Results  Component Value Date   INR 1.36 10/27/2018   INR 1.40 05/19/2017   INR 1.24 02/25/2016     Non-Invasive Vascular Imaging:    None   ASSESSMENT/PLAN: This is a 53 y.o. male that vascular surgery has been consulted for evaluation of left arm AV fistula given concern for infection after recent revision.  On evaluation the incision is overall healing nicely except for a very small pinpoint area at the most proximal extent of the incision in the left arm.  There is no fluctuance or other abscess that I appreciate here.  There is only a little pinpoint area of purulent drainage that I can only express a minimal amount of material.  I do not see anything that would warrant surgical drainage in the OR at this time.  I have recommended one week of antibiotics and discussed this with the ED.  I would be okay with clindamycin or whatever else is felt to be appropriate.  We can arrange wound check in 1 to 2 weeks.  They should be able to keep using his fistula by accessing away from the incision.  Per patient's history as well as after chart review, I do not see any history of prosthetic material that would warrant explanting his fistula since this appears to be  autogenous vein.    Marty Heck, MD [facility] Office: [phone] Pager: [phone]

## 2019-09-05 NOTE — ED Provider Notes (Signed)
[facility] EMERGENCY DEPARTMENT Provider Note   CSN: [id number] Arrival date & time: 09/05/19  1136     History   Chief Complaint Chief Complaint  Patient presents with  . Dialysis Graft Infection    HPI [name] [name] is a 53 y.o. male.     54yo M w/ PMH including ESRD on HD, CHF, polysubstance abuse, prolonged QT, HTN who p/w fistula infection. Last night, he noticed some soreness around his fistula. He pushed on it and it began draining pus. He had a normal dialysis session today with no problems during access. He continues to have soreness in his arm. He denies fever, body aches, or other complaints.   The history is provided by the patient.    Past Medical History:  Diagnosis Date  . Abdominal distension 08/10/2018  . Alcohol abuse 11/26/2010   Qualifier: Diagnosis of  By: [name] MD, Pershing Proud  . Anemia due to blood loss, acute 07/15/2012  . CHF (congestive heart failure) ([place])   . Cholecystitis 04/18/2014  . Cigarette smoker    has currently quit  . Cocaine abuse ([place]) 11/26/2010   Qualifier: Diagnosis of  By: [name] MD, Benjamine Mola  has been over a year  . Combined congestive systolic and diastolic heart failure ([place]) 04/18/2014   A. Echo 8/13: Severe LVH, EF 40-45%, inferoposterior akinesis, grade 2 diastolic dysfunction, moderate LAE, mild RVE, mildly reduced RVSF, mild RAE; cannot rule out R atrial mass-suggest TEE or cardiac MRI  //  B. Echo 3/17: Mild LVH, EF 25-30%, diffuse HK, grade 2 diastolic dysfunction, mild MR, severe LAE,  moderately reduced RVSF, severe RAE, mild TR, moderate PI, PASP 55 mmHg    . Dyspnea   . ESRD (end stage renal disease) on dialysis [facility])    MWF- [place] (05/18/2017)  . Hemodialysis patient ([place])    M,W,F  . Hernia, inguinal, right 08/05/2016  . Hypertension   . 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 Hypocalcemia 07/15/2012  . Medical non-compliance   . Non-ischemic cardiomyopathy ([place])    A. R/L HC 3/17: Normal coronary arteries, moderate pulmonary hypertension (PASP 65 mmHg), elevated LV filling pressures (LVEDP 45 mmHg)   . NSVT (nonsustained ventricular tachycardia) ([place]) 02/22/2016  . NSVT (nonsustained ventricular tachycardia) ([place]) 02/22/2016  . Pneumonia 11/12/2017  . Polysubstance abuse ([place])   . Prolonged Q-T interval on ECG 05/18/2017  . Prolonged QT interval 05/18/2017  . Restless leg syndrome   . Solitary pulmonary nodule 10/10/2015   See cxr 10/09/2015 - CT rec 10/10/2015 >>>   . Upper airway cough syndrome 10/09/2015   Off ACEi around 1st Oct 2016  - Sinus CT 10/10/2015 >>>      Patient Active Problem List   Diagnosis Date Noted  . Arm pain, anterior, left 11/05/2018  . NICM (nonischemic cardiomyopathy) ([place]) 11/03/2018  . Essential hypertension 11/03/2018  . ESRD needing dialysis ([place]) 10/26/2018  . Prolonged Q-T interval on ECG 05/18/2017  . Hernia, inguinal, right 08/05/2016  . Solitary pulmonary nodule 10/10/2015  . Chronic combined systolic and diastolic heart failure ([place]) 04/18/2014  . Anemia due to blood loss, acute 07/15/2012  . Hypocalcemia 07/15/2012  . Cigarette smoker 11/26/2010    Past Surgical History:  Procedure Laterality Date  . [place] TRANSPOSITION  01/19/2013   Procedure: BASCILIC VEIN TRANSPOSITION;  Surgeon: Conrad Fairbury, MD;  [facility] OR;  Service: Vascular;  Laterality: Left;  left 2nd stage basilic vein transposition  . CARDIAC CATHETERIZATION N/A 02/25/2016   Procedure: Right/Left Heart Cath and Coronary Angiography;  Surgeon: Burnell Blanks, MD;  Location: [facility] CV LAB;  Service: Cardiovascular;  Laterality: N/A;  . INGUINAL HERNIA REPAIR Right 08/05/2016   Procedure: RIGHT INGUINAL HERNIA REPAIR WITH MESH;  Surgeon: Donnie Mesa, MD;  Location: [facility];  Service: General;  Laterality: Right;   . INSERTION OF DIALYSIS CATHETER  07/17/2012   Procedure: INSERTION OF DIALYSIS CATHETER;  Surgeon: Conrad [place], MD;  Location: [place];  Service: Vascular;  Laterality: Right;  right internal jugular  . INSERTION OF MESH Right 08/05/2016   Procedure: INSERTION OF MESH;  Surgeon: Donnie Mesa, MD;  Location: [facility];  Service: General;  Laterality: Right;  . REVISION OF ARTERIOVENOUS GORETEX GRAFT Left 0/12/7492   Procedure: PLICATION OF ARTERIOVENOUS FISTULA LEFT ARM;  Surgeon: Elam Dutch, MD;  Location: [place];  Service: Vascular;  Laterality: Left;  . REVISON OF ARTERIOVENOUS FISTULA Left 01/06/2017   Procedure: REVISON OF ARTERIOVENOUS FISTULA;  Surgeon: Conrad Sanborn, MD;  Location: [place];  Service: Vascular;  Laterality: Left;  . REVISON OF ARTERIOVENOUS FISTULA Left 10/27/2018   Procedure: REVISION PLICATION OF ARTERIOVENOUS FISTULA  LEFT UPPER ARM;  Surgeon: Waynetta Sandy, MD;  Location: [place];  Service: Vascular;  Laterality: Left;  . UMBILICAL HERNIA REPAIR N/A 08/05/2016   Procedure: [place];  Surgeon: Donnie Mesa, MD;  Location: [place];  Service: General;  Laterality: N/A;  . VENOGRAM N/A 08/09/2012   Procedure: VENOGRAM;  Surgeon: Conrad [place]hill, MD;  Location: [facility] CATH LAB;  Service: Cardiovascular;  Laterality: N/A;        Home Medications    Prior to Admission medications   Medication Sig Start Date End Date Taking? Authorizing Provider  calcium acetate (PHOSLO) 667 MG capsule Take 3 capsules (2,001 mg total) by mouth 3 (three) times daily with meals. Patient taking differently: Take 1,334-3,335 mg by mouth See admin instructions. Take 3-5 capsules (2001 - 3335 mg) by mouth 3 times daily with large meals, take 2 capsules (1334 mg) with snacks 04/21/14   Mikhail, Velta Addison, DO  carvedilol (COREG) 25 MG tablet Take 1 tablet (25 mg total) by mouth 2 (two) times daily. 05/20/17   Geradine Girt, DO  clindamycin (CLEOCIN) 150 MG capsule Take 2  capsules (300 mg total) by mouth 3 (three) times daily for 7 days. 09/05/19 09/12/19  Little, Wenda Overland, MD  clonazePAM (KLONOPIN) 0.5 MG tablet Take 1 tablet (0.5 mg total) by mouth as needed. Patient taking differently: Take 0.5 mg by mouth every Monday, Wednesday, and Friday with hemodialysis.  09/17/18   Jean Rosenthal, MD  HYDROcodone-acetaminophen (NORCO) 5-325 MG tablet Take 1 tablet by mouth every 6 (six) hours as needed for moderate pain. 08/02/19   Dagoberto Ligas, PA-C  isosorbide mononitrate (IMDUR) 60 MG 24 hr tablet Take 1 tablet (60 mg total) by mouth daily. 05/20/17   Geradine Girt, DO    Family History Family History  Problem Relation Age of Onset  . Hypertension Mother   . Diabetes Mother   . Renal Disease Mother   . Hypertension Father     Social History Social History   Tobacco Use  . Smoking status: Former Smoker    Years: 30.00    Types: Cigarettes    Quit date: 10/20/2018  Years since quitting: 0.8  . Smokeless tobacco: Never Used  Substance Use Topics  . Alcohol use: Yes    Alcohol/week: 4.0 standard drinks    Types: 4 Cans of beer per week  . Drug use: No    Types: Marijuana    Comment: 05/18/2017 "qd"     Allergies   Losartan   Review of Systems Review of Systems All other systems reviewed and are negative except that which was mentioned in HPI   Physical Exam Updated Vital Signs BP 112/82 (BP Location: Right Arm)   Pulse 73   Temp 98.3 F (36.8 C) (Oral)   Resp 16   SpO2 97%   Physical Exam Vitals signs and nursing note reviewed.  Constitutional:      General: He is not in acute distress.    Appearance: He is well-developed.  HENT:     Head: Normocephalic and atraumatic.  Eyes:     Conjunctiva/sclera: Conjunctivae normal.  Neck:     Musculoskeletal: Neck supple.  Cardiovascular:     Rate and Rhythm: Normal rate and regular rhythm.     Pulses: Normal pulses.     Heart sounds: Murmur present.  Pulmonary:     Effort:  Pulmonary effort is normal.     Breath sounds: Normal breath sounds.  Abdominal:     General: Bowel sounds are normal. There is no distension.     Palpations: Abdomen is soft.     Tenderness: There is no abdominal tenderness.  Musculoskeletal:        General: Tenderness present.     Comments: Tenderness and warmth of L upper arm over fistula; pus draining from proximal edge of fistula with surrounding tenderness; bandage in place over previous puncture sites on distal portion of fistula  Skin:    General: Skin is warm and dry.  Neurological:     Mental Status: He is alert and oriented to person, place, and time.     Comments: Fluent speech  Psychiatric:        Judgment: Judgment normal.      ED Treatments / Results  Labs (all labs ordered are listed, but only abnormal results are displayed) Labs Reviewed  COMPREHENSIVE METABOLIC PANEL - Abnormal; Notable for the following components:      Result Value   Chloride 93 (*)    BUN 28 (*)    Creatinine, Ser 6.79 (*)    Calcium 8.1 (*)    Total Bilirubin 1.5 (*)    GFR calc non Af Amer 9 (*)    GFR calc Af Amer 10 (*)    Anion gap 18 (*)    All other components within normal limits  CBC WITH DIFFERENTIAL/PLATELET - Abnormal; Notable for the following components:   RBC 3.92 (*)    Hemoglobin 12.4 (*)    HCT 37.0 (*)    Platelets 126 (*)    All other components within normal limits  CULTURE, BLOOD (SINGLE)  LACTIC ACID, PLASMA  LACTIC ACID, PLASMA    EKG None  Radiology No results found.  Procedures Procedures (including critical care time)  Medications Ordered in ED Medications  sodium chloride flush (NS) 0.9 % injection 3 mL (has no administration in time range)  clindamycin (CLEOCIN) capsule 300 mg (300 mg Oral Given 09/05/19 2036)     Initial Impression / Assessment and Plan / ED Course  I have reviewed the triage vital signs and the nursing notes.  Pertinent labs  that were available during  my care of the  patient were reviewed by me and considered in my medical decision making (see chart for details).       Focal area draining pus on proximal portion of fistula with no drainage or tenderness of distal portion where it had been accessed earlier today. Afebrile, reassuring WBC count. Discussed w/ vascular, Dr. Carlis Abbott, who evaluated the patient in the ED.  I appreciate his assistance with the patient's care.  He advised that the infection appears very localized and superficial and because he does not have any graft or foreign material, he can be discharged home with a one-week course of antibiotics and follow-up.  I have discussed this plan with patient, gave first dose of clindamycin here.  I have extensively reviewed return precautions including any worsening symptoms.  He voiced understanding.  Final Clinical Impressions(s) / ED Diagnoses   Final diagnoses:  Skin infection    ED Discharge Orders         Ordered    clindamycin (CLEOCIN) 150 MG capsule  3 times daily     09/05/19 2128           Little, Wenda Overland, MD 09/05/19 2130

## 2019-09-10 LAB — CULTURE, BLOOD (SINGLE): Culture: NO GROWTH

## 2019-09-12 DIAGNOSIS — Z992 Dependence on renal dialysis: Secondary | ICD-10-CM | POA: Diagnosis not present

## 2019-09-12 DIAGNOSIS — N2581 Secondary hyperparathyroidism of renal origin: Secondary | ICD-10-CM | POA: Diagnosis not present

## 2019-09-12 DIAGNOSIS — L089 Local infection of the skin and subcutaneous tissue, unspecified: Secondary | ICD-10-CM | POA: Diagnosis not present

## 2019-09-12 DIAGNOSIS — D509 Iron deficiency anemia, unspecified: Secondary | ICD-10-CM | POA: Diagnosis not present

## 2019-09-12 DIAGNOSIS — N186 End stage renal disease: Secondary | ICD-10-CM | POA: Diagnosis not present

## 2019-09-14 DIAGNOSIS — D509 Iron deficiency anemia, unspecified: Secondary | ICD-10-CM | POA: Diagnosis not present

## 2019-09-14 DIAGNOSIS — N2581 Secondary hyperparathyroidism of renal origin: Secondary | ICD-10-CM | POA: Diagnosis not present

## 2019-09-14 DIAGNOSIS — Z992 Dependence on renal dialysis: Secondary | ICD-10-CM | POA: Diagnosis not present

## 2019-09-14 DIAGNOSIS — L089 Local infection of the skin and subcutaneous tissue, unspecified: Secondary | ICD-10-CM | POA: Diagnosis not present

## 2019-09-14 DIAGNOSIS — N186 End stage renal disease: Secondary | ICD-10-CM | POA: Diagnosis not present

## 2019-09-15 DIAGNOSIS — N186 End stage renal disease: Secondary | ICD-10-CM | POA: Diagnosis not present

## 2019-09-15 DIAGNOSIS — I120 Hypertensive chronic kidney disease with stage 5 chronic kidney disease or end stage renal disease: Secondary | ICD-10-CM | POA: Diagnosis not present

## 2019-09-15 DIAGNOSIS — Z992 Dependence on renal dialysis: Secondary | ICD-10-CM | POA: Diagnosis not present

## 2019-09-16 DIAGNOSIS — Z992 Dependence on renal dialysis: Secondary | ICD-10-CM | POA: Diagnosis not present

## 2019-09-16 DIAGNOSIS — N2581 Secondary hyperparathyroidism of renal origin: Secondary | ICD-10-CM | POA: Diagnosis not present

## 2019-09-16 DIAGNOSIS — N186 End stage renal disease: Secondary | ICD-10-CM | POA: Diagnosis not present

## 2019-09-16 DIAGNOSIS — Z23 Encounter for immunization: Secondary | ICD-10-CM | POA: Diagnosis not present

## 2019-09-16 DIAGNOSIS — D509 Iron deficiency anemia, unspecified: Secondary | ICD-10-CM | POA: Diagnosis not present

## 2019-09-19 DIAGNOSIS — Z992 Dependence on renal dialysis: Secondary | ICD-10-CM | POA: Diagnosis not present

## 2019-09-19 DIAGNOSIS — Z23 Encounter for immunization: Secondary | ICD-10-CM | POA: Diagnosis not present

## 2019-09-19 DIAGNOSIS — N186 End stage renal disease: Secondary | ICD-10-CM | POA: Diagnosis not present

## 2019-09-19 DIAGNOSIS — N2581 Secondary hyperparathyroidism of renal origin: Secondary | ICD-10-CM | POA: Diagnosis not present

## 2019-09-19 DIAGNOSIS — D509 Iron deficiency anemia, unspecified: Secondary | ICD-10-CM | POA: Diagnosis not present

## 2019-09-20 ENCOUNTER — Ambulatory Visit: Payer: Medicare Other

## 2019-09-21 DIAGNOSIS — N186 End stage renal disease: Secondary | ICD-10-CM | POA: Diagnosis not present

## 2019-09-21 DIAGNOSIS — N2581 Secondary hyperparathyroidism of renal origin: Secondary | ICD-10-CM | POA: Diagnosis not present

## 2019-09-21 DIAGNOSIS — D509 Iron deficiency anemia, unspecified: Secondary | ICD-10-CM | POA: Diagnosis not present

## 2019-09-21 DIAGNOSIS — Z992 Dependence on renal dialysis: Secondary | ICD-10-CM | POA: Diagnosis not present

## 2019-09-21 DIAGNOSIS — Z23 Encounter for immunization: Secondary | ICD-10-CM | POA: Diagnosis not present

## 2019-09-23 DIAGNOSIS — N2581 Secondary hyperparathyroidism of renal origin: Secondary | ICD-10-CM | POA: Diagnosis not present

## 2019-09-23 DIAGNOSIS — Z992 Dependence on renal dialysis: Secondary | ICD-10-CM | POA: Diagnosis not present

## 2019-09-23 DIAGNOSIS — Z23 Encounter for immunization: Secondary | ICD-10-CM | POA: Diagnosis not present

## 2019-09-23 DIAGNOSIS — D509 Iron deficiency anemia, unspecified: Secondary | ICD-10-CM | POA: Diagnosis not present

## 2019-09-23 DIAGNOSIS — N186 End stage renal disease: Secondary | ICD-10-CM | POA: Diagnosis not present

## 2019-09-26 DIAGNOSIS — N2581 Secondary hyperparathyroidism of renal origin: Secondary | ICD-10-CM | POA: Diagnosis not present

## 2019-09-26 DIAGNOSIS — Z992 Dependence on renal dialysis: Secondary | ICD-10-CM | POA: Diagnosis not present

## 2019-09-26 DIAGNOSIS — Z23 Encounter for immunization: Secondary | ICD-10-CM | POA: Diagnosis not present

## 2019-09-26 DIAGNOSIS — D509 Iron deficiency anemia, unspecified: Secondary | ICD-10-CM | POA: Diagnosis not present

## 2019-09-26 DIAGNOSIS — N186 End stage renal disease: Secondary | ICD-10-CM | POA: Diagnosis not present

## 2019-09-28 DIAGNOSIS — Z992 Dependence on renal dialysis: Secondary | ICD-10-CM | POA: Diagnosis not present

## 2019-09-28 DIAGNOSIS — Z23 Encounter for immunization: Secondary | ICD-10-CM | POA: Diagnosis not present

## 2019-09-28 DIAGNOSIS — N2581 Secondary hyperparathyroidism of renal origin: Secondary | ICD-10-CM | POA: Diagnosis not present

## 2019-09-28 DIAGNOSIS — D509 Iron deficiency anemia, unspecified: Secondary | ICD-10-CM | POA: Diagnosis not present

## 2019-09-28 DIAGNOSIS — N186 End stage renal disease: Secondary | ICD-10-CM | POA: Diagnosis not present

## 2019-09-30 DIAGNOSIS — D509 Iron deficiency anemia, unspecified: Secondary | ICD-10-CM | POA: Diagnosis not present

## 2019-09-30 DIAGNOSIS — N2581 Secondary hyperparathyroidism of renal origin: Secondary | ICD-10-CM | POA: Diagnosis not present

## 2019-09-30 DIAGNOSIS — Z23 Encounter for immunization: Secondary | ICD-10-CM | POA: Diagnosis not present

## 2019-09-30 DIAGNOSIS — N186 End stage renal disease: Secondary | ICD-10-CM | POA: Diagnosis not present

## 2019-09-30 DIAGNOSIS — Z992 Dependence on renal dialysis: Secondary | ICD-10-CM | POA: Diagnosis not present

## 2019-10-03 DIAGNOSIS — D509 Iron deficiency anemia, unspecified: Secondary | ICD-10-CM | POA: Diagnosis not present

## 2019-10-03 DIAGNOSIS — Z992 Dependence on renal dialysis: Secondary | ICD-10-CM | POA: Diagnosis not present

## 2019-10-03 DIAGNOSIS — Z23 Encounter for immunization: Secondary | ICD-10-CM | POA: Diagnosis not present

## 2019-10-03 DIAGNOSIS — N2581 Secondary hyperparathyroidism of renal origin: Secondary | ICD-10-CM | POA: Diagnosis not present

## 2019-10-03 DIAGNOSIS — N186 End stage renal disease: Secondary | ICD-10-CM | POA: Diagnosis not present

## 2019-10-05 DIAGNOSIS — Z992 Dependence on renal dialysis: Secondary | ICD-10-CM | POA: Diagnosis not present

## 2019-10-05 DIAGNOSIS — Z23 Encounter for immunization: Secondary | ICD-10-CM | POA: Diagnosis not present

## 2019-10-05 DIAGNOSIS — N186 End stage renal disease: Secondary | ICD-10-CM | POA: Diagnosis not present

## 2019-10-05 DIAGNOSIS — D509 Iron deficiency anemia, unspecified: Secondary | ICD-10-CM | POA: Diagnosis not present

## 2019-10-05 DIAGNOSIS — N2581 Secondary hyperparathyroidism of renal origin: Secondary | ICD-10-CM | POA: Diagnosis not present

## 2019-10-10 DIAGNOSIS — N186 End stage renal disease: Secondary | ICD-10-CM | POA: Diagnosis not present

## 2019-10-10 DIAGNOSIS — D509 Iron deficiency anemia, unspecified: Secondary | ICD-10-CM | POA: Diagnosis not present

## 2019-10-10 DIAGNOSIS — N2581 Secondary hyperparathyroidism of renal origin: Secondary | ICD-10-CM | POA: Diagnosis not present

## 2019-10-10 DIAGNOSIS — Z992 Dependence on renal dialysis: Secondary | ICD-10-CM | POA: Diagnosis not present

## 2019-10-10 DIAGNOSIS — Z23 Encounter for immunization: Secondary | ICD-10-CM | POA: Diagnosis not present

## 2019-10-12 DIAGNOSIS — N2581 Secondary hyperparathyroidism of renal origin: Secondary | ICD-10-CM | POA: Diagnosis not present

## 2019-10-12 DIAGNOSIS — Z23 Encounter for immunization: Secondary | ICD-10-CM | POA: Diagnosis not present

## 2019-10-12 DIAGNOSIS — Z992 Dependence on renal dialysis: Secondary | ICD-10-CM | POA: Diagnosis not present

## 2019-10-12 DIAGNOSIS — D509 Iron deficiency anemia, unspecified: Secondary | ICD-10-CM | POA: Diagnosis not present

## 2019-10-12 DIAGNOSIS — N186 End stage renal disease: Secondary | ICD-10-CM | POA: Diagnosis not present

## 2019-10-14 DIAGNOSIS — Z992 Dependence on renal dialysis: Secondary | ICD-10-CM | POA: Diagnosis not present

## 2019-10-14 DIAGNOSIS — Z23 Encounter for immunization: Secondary | ICD-10-CM | POA: Diagnosis not present

## 2019-10-14 DIAGNOSIS — N186 End stage renal disease: Secondary | ICD-10-CM | POA: Diagnosis not present

## 2019-10-14 DIAGNOSIS — N2581 Secondary hyperparathyroidism of renal origin: Secondary | ICD-10-CM | POA: Diagnosis not present

## 2019-10-14 DIAGNOSIS — D509 Iron deficiency anemia, unspecified: Secondary | ICD-10-CM | POA: Diagnosis not present

## 2019-10-16 DIAGNOSIS — I120 Hypertensive chronic kidney disease with stage 5 chronic kidney disease or end stage renal disease: Secondary | ICD-10-CM | POA: Diagnosis not present

## 2019-10-16 DIAGNOSIS — N186 End stage renal disease: Secondary | ICD-10-CM | POA: Diagnosis not present

## 2019-10-16 DIAGNOSIS — Z992 Dependence on renal dialysis: Secondary | ICD-10-CM | POA: Diagnosis not present

## 2019-10-17 DIAGNOSIS — D509 Iron deficiency anemia, unspecified: Secondary | ICD-10-CM | POA: Diagnosis not present

## 2019-10-17 DIAGNOSIS — N2581 Secondary hyperparathyroidism of renal origin: Secondary | ICD-10-CM | POA: Diagnosis not present

## 2019-10-17 DIAGNOSIS — Z992 Dependence on renal dialysis: Secondary | ICD-10-CM | POA: Diagnosis not present

## 2019-10-17 DIAGNOSIS — N186 End stage renal disease: Secondary | ICD-10-CM | POA: Diagnosis not present

## 2019-10-19 DIAGNOSIS — N2581 Secondary hyperparathyroidism of renal origin: Secondary | ICD-10-CM | POA: Diagnosis not present

## 2019-10-19 DIAGNOSIS — Z992 Dependence on renal dialysis: Secondary | ICD-10-CM | POA: Diagnosis not present

## 2019-10-19 DIAGNOSIS — N186 End stage renal disease: Secondary | ICD-10-CM | POA: Diagnosis not present

## 2019-10-19 DIAGNOSIS — D509 Iron deficiency anemia, unspecified: Secondary | ICD-10-CM | POA: Diagnosis not present

## 2019-10-24 DIAGNOSIS — N2581 Secondary hyperparathyroidism of renal origin: Secondary | ICD-10-CM | POA: Diagnosis not present

## 2019-10-24 DIAGNOSIS — N186 End stage renal disease: Secondary | ICD-10-CM | POA: Diagnosis not present

## 2019-10-24 DIAGNOSIS — D509 Iron deficiency anemia, unspecified: Secondary | ICD-10-CM | POA: Diagnosis not present

## 2019-10-24 DIAGNOSIS — Z992 Dependence on renal dialysis: Secondary | ICD-10-CM | POA: Diagnosis not present

## 2019-10-26 DIAGNOSIS — N2581 Secondary hyperparathyroidism of renal origin: Secondary | ICD-10-CM | POA: Diagnosis not present

## 2019-10-26 DIAGNOSIS — N186 End stage renal disease: Secondary | ICD-10-CM | POA: Diagnosis not present

## 2019-10-26 DIAGNOSIS — D509 Iron deficiency anemia, unspecified: Secondary | ICD-10-CM | POA: Diagnosis not present

## 2019-10-26 DIAGNOSIS — Z992 Dependence on renal dialysis: Secondary | ICD-10-CM | POA: Diagnosis not present

## 2019-10-28 DIAGNOSIS — Z992 Dependence on renal dialysis: Secondary | ICD-10-CM | POA: Diagnosis not present

## 2019-10-28 DIAGNOSIS — N186 End stage renal disease: Secondary | ICD-10-CM | POA: Diagnosis not present

## 2019-10-28 DIAGNOSIS — N2581 Secondary hyperparathyroidism of renal origin: Secondary | ICD-10-CM | POA: Diagnosis not present

## 2019-10-28 DIAGNOSIS — D509 Iron deficiency anemia, unspecified: Secondary | ICD-10-CM | POA: Diagnosis not present

## 2019-10-30 ENCOUNTER — Emergency Department (HOSPITAL_COMMUNITY)
Admission: EM | Admit: 2019-10-30 | Discharge: 2019-10-30 | Disposition: A | Discharge status: Home / Self Care | Payer: Medicare Other | Attending: Emergency Medicine | Admitting: Emergency Medicine

## 2019-10-30 ENCOUNTER — Encounter (HOSPITAL_COMMUNITY): Payer: Self-pay | Admitting: Emergency Medicine

## 2019-10-30 ENCOUNTER — Other Ambulatory Visit: Payer: Self-pay

## 2019-10-30 DIAGNOSIS — N186 End stage renal disease: Secondary | ICD-10-CM | POA: Insufficient documentation

## 2019-10-30 DIAGNOSIS — H5789 Other specified disorders of eye and adnexa: Secondary | ICD-10-CM | POA: Diagnosis not present

## 2019-10-30 DIAGNOSIS — H1131 Conjunctival hemorrhage, right eye: Secondary | ICD-10-CM | POA: Insufficient documentation

## 2019-10-30 DIAGNOSIS — I5042 Chronic combined systolic (congestive) and diastolic (congestive) heart failure: Secondary | ICD-10-CM | POA: Insufficient documentation

## 2019-10-30 DIAGNOSIS — Z87891 Personal history of nicotine dependence: Secondary | ICD-10-CM | POA: Diagnosis not present

## 2019-10-30 DIAGNOSIS — Z992 Dependence on renal dialysis: Secondary | ICD-10-CM | POA: Diagnosis not present

## 2019-10-30 DIAGNOSIS — I132 Hypertensive heart and chronic kidney disease with heart failure and with stage 5 chronic kidney disease, or end stage renal disease: Secondary | ICD-10-CM | POA: Insufficient documentation

## 2019-10-30 DIAGNOSIS — Z79899 Other long term (current) drug therapy: Secondary | ICD-10-CM | POA: Insufficient documentation

## 2019-10-30 DIAGNOSIS — H5711 Ocular pain, right eye: Secondary | ICD-10-CM | POA: Diagnosis not present

## 2019-10-30 MED ORDER — TOBRADEX 0.3-0.1 % OP OINT: 1.0000 "application " | TOPICAL_OINTMENT | Freq: Three times a day (TID) | OPHTHALMIC | 0 refills | Status: AC

## 2019-10-30 MED ORDER — FLUORESCEIN SODIUM 1 MG OP STRP
1.0000 | ORAL_STRIP | Freq: Once | OPHTHALMIC | Status: AC
  Administered 2019-10-30: 12:00:00 1 via OPHTHALMIC
  Filled 2019-10-30: qty 1

## 2019-10-30 MED ORDER — TETRACAINE HCL 0.5 % OP SOLN
2.0000 [drp] | Freq: Once | OPHTHALMIC | Status: AC
  Administered 2019-10-30: 2 [drp] via OPHTHALMIC
  Filled 2019-10-30: qty 4

## 2019-10-30 NOTE — ED Triage Notes (Signed)
PT. STATED, IVE HAD EYE PAIN FOR 3 DAYS, DENIES ANY BLURRED VISION

## 2019-10-30 NOTE — ED Provider Notes (Addendum)
[facility] EMERGENCY DEPARTMENT Provider Note   CSN: [id number] Arrival date & time: 10/30/19  1028     History   Chief Complaint Chief Complaint  Patient presents with  . Eye Pain    HPI [name] [name] is a 53 y.o. male with significant medical history as below presents today for eye pain.  Patient reports he was rubbing his eye a few days ago prior to developing pain.  He reports he has had 2-3 days of right eye pain constant severe burning sharp no alleviating factors worsened with touching the area, nonradiating.  He reports clear drainage from the area over the past 2 days.  Denies fever/chills, headache, vision changes, contact lens use, neck pain, facial swelling or any additional concerns.    Of note patient reports Tdap utd within past year.  HPI  Past Medical History:  Diagnosis Date  . Abdominal distension 08/10/2018  . Alcohol abuse 11/26/2010   Qualifier: Diagnosis of  By: [name] MD, Pershing Proud  . Anemia due to blood loss, acute 07/15/2012  . CHF (congestive heart failure) ([place])   . Cholecystitis 04/18/2014  . Cigarette smoker    has currently quit  . Cocaine abuse ([place]) 11/26/2010   Qualifier: Diagnosis of  By: [name] MD, Benjamine Mola  has been over a year  . Combined congestive systolic and diastolic heart failure ([place]) 04/18/2014   A. Echo 8/13: Severe LVH, EF 40-45%, inferoposterior akinesis, grade 2 diastolic dysfunction, moderate LAE, mild RVE, mildly reduced RVSF, mild RAE; cannot rule out R atrial mass-suggest TEE or cardiac MRI  //  B. Echo 3/17: Mild LVH, EF 25-30%, diffuse HK, grade 2 diastolic dysfunction, mild MR, severe LAE,  moderately reduced RVSF, severe RAE, mild TR, moderate PI, PASP 55 mmHg    . Dyspnea   . ESRD (end stage renal disease) on dialysis [facility])    MWF- [place] (05/18/2017)  . Hemodialysis patient ([place])    M,W,F  . Hernia, inguinal, right 08/05/2016  . Hypertension   . Hypertensive heart and  kidney disease with heart failure and end-stage renal failure ([place]) 07/13/2009   Qualifier: Diagnosis of  By: [name] MD, Benjamine Mola    . Hypocalcemia 07/15/2012  . Medical non-compliance   . Non-ischemic cardiomyopathy ([place])    A. R/L HC 3/17: Normal coronary arteries, moderate pulmonary hypertension (PASP 65 mmHg), elevated LV filling pressures (LVEDP 45 mmHg)   . NSVT (nonsustained ventricular tachycardia) ([place]) 02/22/2016  . NSVT (nonsustained ventricular tachycardia) ([place]) 02/22/2016  . Pneumonia 11/12/2017  . Polysubstance abuse ([place])   . Prolonged Q-T interval on ECG 05/18/2017  . Prolonged QT interval 05/18/2017  . Restless leg syndrome   . Solitary pulmonary nodule 10/10/2015   See cxr 10/09/2015 - CT rec 10/10/2015 >>>   . Upper airway cough syndrome 10/09/2015   Off ACEi around 1st Oct 2016  - Sinus CT 10/10/2015 >>>      Patient Active Problem List   Diagnosis Date Noted  . Arm pain, anterior, left 11/05/2018  . NICM (nonischemic cardiomyopathy) ([place]) 11/03/2018  . Essential hypertension 11/03/2018  . ESRD needing dialysis ([place]) 10/26/2018  . Prolonged Q-T interval on ECG 05/18/2017  . Hernia, inguinal, right 08/05/2016  . Solitary pulmonary nodule 10/10/2015  . Chronic combined systolic and diastolic heart failure ([place]) 04/18/2014  . Anemia due to blood loss, acute 07/15/2012  . Hypocalcemia 07/15/2012  . Cigarette smoker 11/26/2010    Past Surgical History:  Procedure Laterality Date  . [place]  TRANSPOSITION  01/19/2013   Procedure: BASCILIC VEIN TRANSPOSITION;  Surgeon: Conrad [place], MD;  Location: [place];  Service: Vascular;  Laterality: Left;  left 2nd stage basilic vein transposition  . CARDIAC CATHETERIZATION N/A 02/25/2016   Procedure: Right/Left Heart Cath and Coronary Angiography;  Surgeon: Burnell Blanks, MD;  Location: [place] CV LAB;  Service: Cardiovascular;  Laterality: N/A;  . INGUINAL HERNIA REPAIR Right 08/05/2016   Procedure: RIGHT INGUINAL  HERNIA REPAIR WITH MESH;  Surgeon: Donnie Mesa, MD;  Location: [place];  Service: General;  Laterality: Right;  . INSERTION OF DIALYSIS CATHETER  07/17/2012   Procedure: INSERTION OF DIALYSIS CATHETER;  Surgeon: Conrad Elliott, MD;  Location: [place];  Service: Vascular;  Laterality: Right;  right internal jugular  . INSERTION OF MESH Right 08/05/2016   Procedure: INSERTION OF MESH;  Surgeon: Donnie Mesa, MD;  Location: [place] [facility];  Service: General;  Laterality: Right;  . REVISION OF ARTERIOVENOUS GORETEX GRAFT Left 2/53/6644   Procedure: PLICATION OF ARTERIOVENOUS FISTULA LEFT ARM;  Surgeon: Elam Dutch, MD;  Location: [place];  Service: Vascular;  Laterality: Left;  . REVISON OF ARTERIOVENOUS FISTULA Left 01/06/2017   Procedure: REVISON OF ARTERIOVENOUS FISTULA;  Surgeon: Conrad [place], MD;  Location: [place];  Service: Vascular;  Laterality: Left;  . REVISON OF ARTERIOVENOUS FISTULA Left 10/27/2018   Procedure: REVISION PLICATION OF ARTERIOVENOUS FISTULA  LEFT UPPER ARM;  Surgeon: Waynetta Sandy, MD;  Location: [place];  Service: Vascular;  Laterality: Left;  . UMBILICAL HERNIA REPAIR N/A 08/05/2016   Procedure: [place];  Surgeon: Donnie Mesa, MD;  Location: [place];  Service: General;  Laterality: N/A;  . VENOGRAM N/A 08/09/2012   Procedure: VENOGRAM;  Surgeon: Conrad [place]unn[place], MD;  Location: [facility] CATH LAB;  Service: Cardiovascular;  Laterality: N/A;        Home Medications    Prior to Admission medications   Medication Sig Start Date End Date Taking? Authorizing Provider  calcium acetate (PHOSLO) 667 MG capsule Take 3 capsules (2,001 mg total) by mouth 3 (three) times daily with meals. Patient taking differently: Take 1,334-3,335 mg by mouth See admin instructions. Take 3-5 capsules (2001 - 3335 mg) by mouth 3 times daily with large meals, take 2 capsules (1334 mg) with snacks 04/21/14   Mikhail, Velta Addison, DO  carvedilol (COREG) 25 MG tablet Take 1 tablet (25 mg total)  by mouth 2 (two) times daily. 05/20/17   Geradine Girt, DO  clonazePAM (KLONOPIN) 0.5 MG tablet Take 1 tablet (0.5 mg total) by mouth as needed. Patient taking differently: Take 0.5 mg by mouth every Monday, Wednesday, and Friday with hemodialysis.  09/17/18   Jean Rosenthal, MD  HYDROcodone-acetaminophen (NORCO) 5-325 MG tablet Take 1 tablet by mouth every 6 (six) hours as needed for moderate pain. 08/02/19   Dagoberto Ligas, PA-C  isosorbide mononitrate (IMDUR) 60 MG 24 hr tablet Take 1 tablet (60 mg total) by mouth daily. 05/20/17   Geradine Girt, DO  tobramycin-dexamethasone (TOBRADEX) ophthalmic ointment Place 1 application into the right eye 3 (three) times daily for 7 days. 10/30/19 11/06/19  Deliah Boston, PA-C    Family History Family History  Problem Relation Age of Onset  . Hypertension Mother   . Diabetes Mother   . Renal Disease Mother   . Hypertension Father     Social History Social History   Tobacco Use  . Smoking status: Former Smoker  Years: 30.00    Types: Cigarettes    Quit date: 10/20/2018    Years since quitting: 1.0  . Smokeless tobacco: Never Used  Substance Use Topics  . Alcohol use: Yes    Alcohol/week: 4.0 standard drinks    Types: 4 Cans of beer per week  . Drug use: No    Types: Marijuana    Comment: 05/18/2017 "qd"     Allergies   Losartan   Review of Systems Review of Systems Ten systems are reviewed and are negative for acute change except as noted in the HPI   Physical Exam Updated Vital Signs BP (!) 128/91 (BP Location: Right Arm)   Pulse 82   Temp 98.1 F (36.7 C) (Oral)   Resp 20   SpO2 100%   Physical Exam Constitutional:      General: He is not in acute distress.    Appearance: Normal appearance. He is well-developed. He is not ill-appearing or diaphoretic.  HENT:     Head: Normocephalic and atraumatic.     Right Ear: External ear normal.     Left Ear: External ear normal.     Nose: Nose normal.  Eyes:      General: Vision grossly intact. Gaze aligned appropriately.     Extraocular Movements: Extraocular movements intact.     Pupils: Pupils are equal, round, and reactive to light.     Comments: Right eye: Large subconjunctival hemorrhage of the medial right eye.  Just medial to the iris on the sclera that there appears to be a 7 mm in diameter brown bulge.  No active discharge.  Pupils equal round reactive to light and accommodating.  Extraocular motions intact without nystagmus.  No photophobia or consensual photophobia.  Corneal Abrasion Exam Verbal Consent Obtained. Risks, benefits and alternatives explained. 2 drops of tetracaine (PONTOCAINE) 0.5 % ophthalmic solution were applied to the eye. Fluorescein 1 MG ophthalmic strip applied the the surface of the eye Wood's lamp used to screen for abrasion. No increased fluorescein uptake. No corneal ulcer. Negative Seidel sign. No foreign bodies noted. No visible hyphema. Eye flushed with sterile saline. Patient tolerated the procedure well  TONOPEN: 25 LEFT, 23 RIGHT   Neck:     Musculoskeletal: Normal range of motion.     Trachea: Trachea and phonation normal. No tracheal deviation.  Pulmonary:     Effort: Pulmonary effort is normal. No respiratory distress.  Musculoskeletal: Normal range of motion.  Skin:    General: Skin is warm and dry.  Neurological:     Mental Status: He is alert.     GCS: GCS eye subscore is 4. GCS verbal subscore is 5. GCS motor subscore is 6.     Comments: Speech is clear and goal oriented, follows commands Major Cranial nerves without deficit, no facial droop Moves extremities without ataxia, coordination intact  Psychiatric:        Behavior: Behavior normal.        ED Treatments / Results  Labs (all labs ordered are listed, but only abnormal results are displayed) Labs Reviewed - No data to display  EKG None  Radiology No results found.  Procedures Procedures (including critical care time)   Medications Ordered in ED Medications  fluorescein ophthalmic strip 1 strip (1 strip Right Eye Given 10/30/19 1134)  tetracaine (PONTOCAINE) 0.5 % ophthalmic solution 2 drop (2 drops Right Eye Given 10/30/19 1135)     Initial Impression / Assessment and Plan / ED Course  I have reviewed  the triage vital signs and the nursing notes.  Pertinent labs & imaging results that were available during my care of the patient were reviewed by me and considered in my medical decision making (see chart for details).  Clinical Course as of Oct 29 1402  Sun Oct 30, 2019  1316 Dr. Baird Cancer; topradex tid; shield at night; cool compresses tid-qid; outpatient f/u if not improved.   [BM]    Clinical Course User Index [BM] Deliah Boston, PA-C   Patient with pain x3 days, subconjunctival hemorrhage of the right medial eye, some bulging in the center as pictured above.  No complaint of vision changes and on visual acuity his vision is actually improved her right eye compared to left.  Pressures bilaterally within normal limits, extraocular motion is intact.  There is no sign of a hyphema, hypopyon or open globe on examination additionally mechanism is low risk, patient not a contact lens user.  Patient seen and evaluated by Dr. Wyvonnia Dusky who advises ophthalmology consult. - Discussed case and findings with ophthalmologist Dr. Baird Cancer who advises this is likely subconjunctival hemorrhage possible minor subconjunctival laceration, advises that we treat patient with TobraDex 3 times a day, have patient chilled his eye at night and use cool compresses 3-4 times a day.  If patient's symptoms do not improve he may follow-up with ophthalmology however Dr. Baird Cancer advises this is not necessary if patient symptoms improved. - Patient reevaluated resting comfortably no acute distress states understanding of care plan and is agreeable to above. - Case rediscussed with Dr. Wyvonnia Dusky who agrees with ophthalmology  recommendations and discharge at this time.  At this time there does not appear to be any evidence of an acute emergency medical condition and the patient appears stable for discharge with appropriate outpatient follow up. Diagnosis was discussed with patient who verbalizes understanding of care plan and is agreeable to discharge. I have discussed return precautions with patient who verbalizes understanding of return precautions. Patient encouraged to follow-up with their PCP. All questions answered.   Note: Portions of this report may have been transcribed using voice recognition software. Every effort was made to ensure accuracy; however, inadvertent computerized transcription errors may still be present. Final Clinical Impressions(s) / ED Diagnoses   Final diagnoses:  Eye irritation  Subconjunctival hemorrhage of right eye    ED Discharge Orders         Ordered    tobramycin-dexamethasone [facility]) ophthalmic ointment  3 times daily     10/30/19 1400           Deliah Boston, [place] 10/30/19 1409    Deliah Boston, PA-C 10/30/19 1434    Ezequiel [place], MD 10/30/19 1829

## 2019-10-30 NOTE — Discharge Instructions (Addendum)
You have been diagnosed today with right eye irritation and subconjunctival hemorrhage.  At this time there does not appear to be the presence of an emergent medical condition, however there is always the potential for conditions to change. Please read and follow the below instructions.  Please return to the Emergency Department immediately for any new or worsening symptoms or if your symptoms do not improve within 3 days. Please be sure to follow up with your Primary Care Provider within one week regarding your visit today; please call their office to schedule an appointment even if you are feeling better for a follow-up visit. Please use the medication TobraDex as prescribed in your right eye 3 times a day.  Please use the eye shield given to you at night to avoid accidental rubbing.  You may use cool compresses as discussed in your eye 2-3 times a day.  If your symptoms do not begin to improve over the next few days please call the eye doctor's office Dr. Baird Cancer on your discharge paperwork to schedule a follow-up appointment.  If your symptoms get worse or if you have vision changes return to the emergency department immediately.  Get help right away if: Your vision changes or you have difficulty seeing. You suddenly develop severe sensitivity to light. You develop a severe headache, persistent vomiting, confusion, or abnormal tiredness (lethargy). Your eye seems to bulge or protrude from your eye socket. You develop unexplained bruises on your body. You have unexplained bleeding in another area of your body. You have any new/concerning or worsening of symptoms  Please read the additional information packets attached to your discharge summary.  Do not take your medicine if  develop an itchy rash, swelling in your mouth or lips, or difficulty breathing; call 911 and seek immediate emergency medical attention if this occurs.  Note: Portions of this text may have been transcribed using voice  recognition software. Every effort was made to ensure accuracy; however, inadvertent computerized transcription errors may still be present.

## 2019-10-31 DIAGNOSIS — N186 End stage renal disease: Secondary | ICD-10-CM | POA: Diagnosis not present

## 2019-10-31 DIAGNOSIS — N2581 Secondary hyperparathyroidism of renal origin: Secondary | ICD-10-CM | POA: Diagnosis not present

## 2019-10-31 DIAGNOSIS — D509 Iron deficiency anemia, unspecified: Secondary | ICD-10-CM | POA: Diagnosis not present

## 2019-10-31 DIAGNOSIS — Z992 Dependence on renal dialysis: Secondary | ICD-10-CM | POA: Diagnosis not present

## 2019-11-02 DIAGNOSIS — N186 End stage renal disease: Secondary | ICD-10-CM | POA: Diagnosis not present

## 2019-11-02 DIAGNOSIS — N2581 Secondary hyperparathyroidism of renal origin: Secondary | ICD-10-CM | POA: Diagnosis not present

## 2019-11-02 DIAGNOSIS — Z992 Dependence on renal dialysis: Secondary | ICD-10-CM | POA: Diagnosis not present

## 2019-11-02 DIAGNOSIS — D509 Iron deficiency anemia, unspecified: Secondary | ICD-10-CM | POA: Diagnosis not present

## 2019-11-08 DIAGNOSIS — N186 End stage renal disease: Secondary | ICD-10-CM | POA: Diagnosis not present

## 2019-11-08 DIAGNOSIS — N2581 Secondary hyperparathyroidism of renal origin: Secondary | ICD-10-CM | POA: Diagnosis not present

## 2019-11-08 DIAGNOSIS — D509 Iron deficiency anemia, unspecified: Secondary | ICD-10-CM | POA: Diagnosis not present

## 2019-11-08 DIAGNOSIS — Z992 Dependence on renal dialysis: Secondary | ICD-10-CM | POA: Diagnosis not present

## 2019-11-14 DIAGNOSIS — N2581 Secondary hyperparathyroidism of renal origin: Secondary | ICD-10-CM | POA: Diagnosis not present

## 2019-11-14 DIAGNOSIS — N186 End stage renal disease: Secondary | ICD-10-CM | POA: Diagnosis not present

## 2019-11-14 DIAGNOSIS — Z992 Dependence on renal dialysis: Secondary | ICD-10-CM | POA: Diagnosis not present

## 2019-11-14 DIAGNOSIS — D509 Iron deficiency anemia, unspecified: Secondary | ICD-10-CM | POA: Diagnosis not present

## 2019-11-16 ENCOUNTER — Inpatient Hospital Stay (HOSPITAL_COMMUNITY): Payer: Medicare Other

## 2019-11-16 ENCOUNTER — Other Ambulatory Visit: Payer: Self-pay

## 2019-11-16 ENCOUNTER — Inpatient Hospital Stay (HOSPITAL_COMMUNITY)
Admission: EM | Admit: 2019-11-16 | Discharge: 2019-12-23 | DRG: 004 | Disposition: A | Discharge status: Other Acute Inpatient Hospital | Payer: Medicare Other | Source: Other Acute Inpatient Hospital | Attending: Pulmonary Disease | Admitting: Pulmonary Disease

## 2019-11-16 ENCOUNTER — Emergency Department (HOSPITAL_COMMUNITY): Payer: Medicare Other

## 2019-11-16 DIAGNOSIS — R140 Abdominal distension (gaseous): Secondary | ICD-10-CM

## 2019-11-16 DIAGNOSIS — J9601 Acute respiratory failure with hypoxia: Secondary | ICD-10-CM | POA: Diagnosis not present

## 2019-11-16 DIAGNOSIS — I472 Ventricular tachycardia: Secondary | ICD-10-CM | POA: Diagnosis not present

## 2019-11-16 DIAGNOSIS — R0689 Other abnormalities of breathing: Secondary | ICD-10-CM | POA: Diagnosis not present

## 2019-11-16 DIAGNOSIS — G931 Anoxic brain damage, not elsewhere classified: Secondary | ICD-10-CM | POA: Diagnosis not present

## 2019-11-16 DIAGNOSIS — E8809 Other disorders of plasma-protein metabolism, not elsewhere classified: Secondary | ICD-10-CM | POA: Diagnosis not present

## 2019-11-16 DIAGNOSIS — J9611 Chronic respiratory failure with hypoxia: Secondary | ICD-10-CM

## 2019-11-16 DIAGNOSIS — L97829 Non-pressure chronic ulcer of other part of left lower leg with unspecified severity: Secondary | ICD-10-CM | POA: Diagnosis not present

## 2019-11-16 DIAGNOSIS — L97819 Non-pressure chronic ulcer of other part of right lower leg with unspecified severity: Secondary | ICD-10-CM | POA: Diagnosis not present

## 2019-11-16 DIAGNOSIS — I82611 Acute embolism and thrombosis of superficial veins of right upper extremity: Secondary | ICD-10-CM | POA: Diagnosis not present

## 2019-11-16 DIAGNOSIS — I998 Other disorder of circulatory system: Secondary | ICD-10-CM | POA: Diagnosis not present

## 2019-11-16 DIAGNOSIS — Z9911 Dependence on respirator [ventilator] status: Secondary | ICD-10-CM

## 2019-11-16 DIAGNOSIS — E1022 Type 1 diabetes mellitus with diabetic chronic kidney disease: Secondary | ICD-10-CM | POA: Diagnosis present

## 2019-11-16 DIAGNOSIS — Z7289 Other problems related to lifestyle: Secondary | ICD-10-CM | POA: Diagnosis not present

## 2019-11-16 DIAGNOSIS — I5042 Chronic combined systolic (congestive) and diastolic (congestive) heart failure: Secondary | ICD-10-CM | POA: Diagnosis present

## 2019-11-16 DIAGNOSIS — N2581 Secondary hyperparathyroidism of renal origin: Secondary | ICD-10-CM | POA: Diagnosis not present

## 2019-11-16 DIAGNOSIS — Z8249 Family history of ischemic heart disease and other diseases of the circulatory system: Secondary | ICD-10-CM

## 2019-11-16 DIAGNOSIS — L8990 Pressure ulcer of unspecified site, unspecified stage: Secondary | ICD-10-CM | POA: Insufficient documentation

## 2019-11-16 DIAGNOSIS — Z992 Dependence on renal dialysis: Secondary | ICD-10-CM

## 2019-11-16 DIAGNOSIS — Z515 Encounter for palliative care: Secondary | ICD-10-CM

## 2019-11-16 DIAGNOSIS — R918 Other nonspecific abnormal finding of lung field: Secondary | ICD-10-CM | POA: Diagnosis not present

## 2019-11-16 DIAGNOSIS — M7989 Other specified soft tissue disorders: Secondary | ICD-10-CM | POA: Diagnosis not present

## 2019-11-16 DIAGNOSIS — J181 Lobar pneumonia, unspecified organism: Secondary | ICD-10-CM | POA: Diagnosis not present

## 2019-11-16 DIAGNOSIS — Z841 Family history of disorders of kidney and ureter: Secondary | ICD-10-CM

## 2019-11-16 DIAGNOSIS — I4891 Unspecified atrial fibrillation: Secondary | ICD-10-CM | POA: Diagnosis not present

## 2019-11-16 DIAGNOSIS — E871 Hypo-osmolality and hyponatremia: Secondary | ICD-10-CM | POA: Diagnosis not present

## 2019-11-16 DIAGNOSIS — L89152 Pressure ulcer of sacral region, stage 2: Secondary | ICD-10-CM | POA: Diagnosis present

## 2019-11-16 DIAGNOSIS — I454 Nonspecific intraventricular block: Secondary | ICD-10-CM | POA: Diagnosis not present

## 2019-11-16 DIAGNOSIS — J9691 Respiratory failure, unspecified with hypoxia: Secondary | ICD-10-CM | POA: Diagnosis not present

## 2019-11-16 DIAGNOSIS — Z930 Tracheostomy status: Secondary | ICD-10-CM | POA: Diagnosis not present

## 2019-11-16 DIAGNOSIS — F410 Panic disorder [episodic paroxysmal anxiety] without agoraphobia: Secondary | ICD-10-CM | POA: Diagnosis not present

## 2019-11-16 DIAGNOSIS — I480 Paroxysmal atrial fibrillation: Secondary | ICD-10-CM

## 2019-11-16 DIAGNOSIS — D631 Anemia in chronic kidney disease: Secondary | ICD-10-CM | POA: Diagnosis not present

## 2019-11-16 DIAGNOSIS — E874 Mixed disorder of acid-base balance: Secondary | ICD-10-CM | POA: Diagnosis not present

## 2019-11-16 DIAGNOSIS — R627 Adult failure to thrive: Secondary | ICD-10-CM

## 2019-11-16 DIAGNOSIS — E8770 Fluid overload, unspecified: Secondary | ICD-10-CM | POA: Diagnosis not present

## 2019-11-16 DIAGNOSIS — I2721 Secondary pulmonary arterial hypertension: Secondary | ICD-10-CM | POA: Diagnosis not present

## 2019-11-16 DIAGNOSIS — N186 End stage renal disease: Secondary | ICD-10-CM | POA: Diagnosis not present

## 2019-11-16 DIAGNOSIS — J15211 Pneumonia due to Methicillin susceptible Staphylococcus aureus: Secondary | ICD-10-CM | POA: Diagnosis not present

## 2019-11-16 DIAGNOSIS — I428 Other cardiomyopathies: Secondary | ICD-10-CM | POA: Diagnosis present

## 2019-11-16 DIAGNOSIS — E162 Hypoglycemia, unspecified: Secondary | ICD-10-CM | POA: Diagnosis not present

## 2019-11-16 DIAGNOSIS — J810 Acute pulmonary edema: Secondary | ICD-10-CM | POA: Diagnosis not present

## 2019-11-16 DIAGNOSIS — I2510 Atherosclerotic heart disease of native coronary artery without angina pectoris: Secondary | ICD-10-CM | POA: Diagnosis present

## 2019-11-16 DIAGNOSIS — J9620 Acute and chronic respiratory failure, unspecified whether with hypoxia or hypercapnia: Secondary | ICD-10-CM | POA: Diagnosis not present

## 2019-11-16 DIAGNOSIS — A419 Sepsis, unspecified organism: Secondary | ICD-10-CM | POA: Diagnosis not present

## 2019-11-16 DIAGNOSIS — K567 Ileus, unspecified: Secondary | ICD-10-CM | POA: Diagnosis not present

## 2019-11-16 DIAGNOSIS — I4901 Ventricular fibrillation: Secondary | ICD-10-CM | POA: Diagnosis not present

## 2019-11-16 DIAGNOSIS — I4819 Other persistent atrial fibrillation: Secondary | ICD-10-CM | POA: Diagnosis present

## 2019-11-16 DIAGNOSIS — I255 Ischemic cardiomyopathy: Secondary | ICD-10-CM | POA: Diagnosis not present

## 2019-11-16 DIAGNOSIS — I469 Cardiac arrest, cause unspecified: Secondary | ICD-10-CM | POA: Diagnosis not present

## 2019-11-16 DIAGNOSIS — R0902 Hypoxemia: Secondary | ICD-10-CM

## 2019-11-16 DIAGNOSIS — A492 Hemophilus influenzae infection, unspecified site: Secondary | ICD-10-CM | POA: Diagnosis not present

## 2019-11-16 DIAGNOSIS — I493 Ventricular premature depolarization: Secondary | ICD-10-CM | POA: Diagnosis present

## 2019-11-16 DIAGNOSIS — E1052 Type 1 diabetes mellitus with diabetic peripheral angiopathy with gangrene: Secondary | ICD-10-CM | POA: Diagnosis not present

## 2019-11-16 DIAGNOSIS — Z87891 Personal history of nicotine dependence: Secondary | ICD-10-CM

## 2019-11-16 DIAGNOSIS — I462 Cardiac arrest due to underlying cardiac condition: Secondary | ICD-10-CM | POA: Diagnosis present

## 2019-11-16 DIAGNOSIS — J9621 Acute and chronic respiratory failure with hypoxia: Principal | ICD-10-CM | POA: Diagnosis present

## 2019-11-16 DIAGNOSIS — I132 Hypertensive heart and chronic kidney disease with heart failure and with stage 5 chronic kidney disease, or end stage renal disease: Secondary | ICD-10-CM | POA: Diagnosis not present

## 2019-11-16 DIAGNOSIS — I5023 Acute on chronic systolic (congestive) heart failure: Secondary | ICD-10-CM

## 2019-11-16 DIAGNOSIS — Z833 Family history of diabetes mellitus: Secondary | ICD-10-CM

## 2019-11-16 DIAGNOSIS — Z20828 Contact with and (suspected) exposure to other viral communicable diseases: Secondary | ICD-10-CM | POA: Diagnosis not present

## 2019-11-16 DIAGNOSIS — I517 Cardiomegaly: Secondary | ICD-10-CM | POA: Diagnosis not present

## 2019-11-16 DIAGNOSIS — J439 Emphysema, unspecified: Secondary | ICD-10-CM | POA: Diagnosis not present

## 2019-11-16 DIAGNOSIS — Z4682 Encounter for fitting and adjustment of non-vascular catheter: Secondary | ICD-10-CM | POA: Diagnosis not present

## 2019-11-16 DIAGNOSIS — Z4659 Encounter for fitting and adjustment of other gastrointestinal appliance and device: Secondary | ICD-10-CM

## 2019-11-16 DIAGNOSIS — Z09 Encounter for follow-up examination after completed treatment for conditions other than malignant neoplasm: Secondary | ICD-10-CM

## 2019-11-16 DIAGNOSIS — R570 Cardiogenic shock: Secondary | ICD-10-CM | POA: Diagnosis not present

## 2019-11-16 DIAGNOSIS — G2581 Restless legs syndrome: Secondary | ICD-10-CM | POA: Diagnosis present

## 2019-11-16 DIAGNOSIS — E10622 Type 1 diabetes mellitus with other skin ulcer: Secondary | ICD-10-CM | POA: Diagnosis not present

## 2019-11-16 DIAGNOSIS — T797XXA Traumatic subcutaneous emphysema, initial encounter: Secondary | ICD-10-CM

## 2019-11-16 DIAGNOSIS — N342 Other urethritis: Secondary | ICD-10-CM

## 2019-11-16 DIAGNOSIS — Z9119 Patient's noncompliance with other medical treatment and regimen: Secondary | ICD-10-CM

## 2019-11-16 DIAGNOSIS — J9690 Respiratory failure, unspecified, unspecified whether with hypoxia or hypercapnia: Secondary | ICD-10-CM

## 2019-11-16 DIAGNOSIS — I2699 Other pulmonary embolism without acute cor pulmonale: Secondary | ICD-10-CM | POA: Diagnosis not present

## 2019-11-16 DIAGNOSIS — E8779 Other fluid overload: Secondary | ICD-10-CM | POA: Diagnosis not present

## 2019-11-16 DIAGNOSIS — J90 Pleural effusion, not elsewhere classified: Secondary | ICD-10-CM | POA: Diagnosis not present

## 2019-11-16 DIAGNOSIS — Z978 Presence of other specified devices: Secondary | ICD-10-CM | POA: Diagnosis not present

## 2019-11-16 DIAGNOSIS — Z888 Allergy status to other drugs, medicaments and biological substances status: Secondary | ICD-10-CM

## 2019-11-16 DIAGNOSIS — J189 Pneumonia, unspecified organism: Secondary | ICD-10-CM

## 2019-11-16 DIAGNOSIS — F1410 Cocaine abuse, uncomplicated: Secondary | ICD-10-CM | POA: Diagnosis present

## 2019-11-16 DIAGNOSIS — G9340 Encephalopathy, unspecified: Secondary | ICD-10-CM | POA: Diagnosis not present

## 2019-11-16 DIAGNOSIS — R9082 White matter disease, unspecified: Secondary | ICD-10-CM | POA: Diagnosis not present

## 2019-11-16 DIAGNOSIS — Z7189 Other specified counseling: Secondary | ICD-10-CM | POA: Diagnosis not present

## 2019-11-16 DIAGNOSIS — E10649 Type 1 diabetes mellitus with hypoglycemia without coma: Secondary | ICD-10-CM | POA: Diagnosis not present

## 2019-11-16 DIAGNOSIS — J811 Chronic pulmonary edema: Secondary | ICD-10-CM

## 2019-11-16 DIAGNOSIS — I2693 Single subsegmental pulmonary embolism without acute cor pulmonale: Secondary | ICD-10-CM | POA: Diagnosis not present

## 2019-11-16 DIAGNOSIS — Z452 Encounter for adjustment and management of vascular access device: Secondary | ICD-10-CM

## 2019-11-16 DIAGNOSIS — J9600 Acute respiratory failure, unspecified whether with hypoxia or hypercapnia: Secondary | ICD-10-CM

## 2019-11-16 DIAGNOSIS — Z20822 Contact with and (suspected) exposure to covid-19: Secondary | ICD-10-CM | POA: Diagnosis present

## 2019-11-16 DIAGNOSIS — F1010 Alcohol abuse, uncomplicated: Secondary | ICD-10-CM | POA: Diagnosis present

## 2019-11-16 DIAGNOSIS — J329 Chronic sinusitis, unspecified: Secondary | ICD-10-CM | POA: Diagnosis not present

## 2019-11-16 DIAGNOSIS — R112 Nausea with vomiting, unspecified: Secondary | ICD-10-CM

## 2019-11-16 DIAGNOSIS — J690 Pneumonitis due to inhalation of food and vomit: Secondary | ICD-10-CM | POA: Diagnosis not present

## 2019-11-16 LAB — POCT I-STAT 7, (LYTES, BLD GAS, ICA,H+H)
Acid-Base Excess: 3 mmol/L — ABNORMAL HIGH (ref 0.0–2.0)
Acid-base deficit: 2 mmol/L (ref 0.0–2.0)
Bicarbonate: 27.3 mmol/L (ref 20.0–28.0)
Bicarbonate: 28.1 mmol/L — ABNORMAL HIGH (ref 20.0–28.0)
Calcium, Ion: 1.35 mmol/L (ref 1.15–1.40)
Calcium, Ion: 1.21 mmol/L (ref 1.15–1.40)
HCT: 42 % (ref 39.0–52.0)
HCT: 39 % (ref 39.0–52.0)
Hemoglobin: 14.3 g/dL (ref 13.0–17.0)
Hemoglobin: 13.3 g/dL (ref 13.0–17.0)
O2 Saturation: 100 %
O2 Saturation: 100 %
Patient temperature: 98.4
Potassium: 5.1 mmol/L (ref 3.5–5.1)
Potassium: 3.8 mmol/L (ref 3.5–5.1)
Sodium: 136 mmol/L (ref 135–145)
Sodium: 138 mmol/L (ref 135–145)
TCO2: 29 mmol/L (ref 22–32)
TCO2: 29 mmol/L (ref 22–32)
pCO2 arterial: 65.7 mmHg (ref 32.0–48.0)
pCO2 arterial: 41.9 mmHg (ref 32.0–48.0)
pH, Arterial: 7.227 — ABNORMAL LOW (ref 7.350–7.450)
pH, Arterial: 7.434 (ref 7.350–7.450)
pO2, Arterial: 349 mmHg — ABNORMAL HIGH (ref 83.0–108.0)
pO2, Arterial: 185 mmHg — ABNORMAL HIGH (ref 83.0–108.0)

## 2019-11-16 LAB — MRSA PCR SCREENING: MRSA by PCR: NEGATIVE

## 2019-11-16 LAB — COMPREHENSIVE METABOLIC PANEL
ALT: 17 U/L (ref 0–44)
AST: 30 U/L (ref 15–41)
Albumin: 3.5 g/dL (ref 3.5–5.0)
Alkaline Phosphatase: 98 U/L (ref 38–126)
Anion gap: 17 — ABNORMAL HIGH (ref 5–15)
BUN: 23 mg/dL — ABNORMAL HIGH (ref 6–20)
CO2: 23 mmol/L (ref 22–32)
Calcium: 9.6 mg/dL (ref 8.9–10.3)
Chloride: 100 mmol/L (ref 98–111)
Creatinine, Ser: 7.01 mg/dL — ABNORMAL HIGH (ref 0.61–1.24)
GFR calc Af Amer: 10 mL/min — ABNORMAL LOW (ref >60)
GFR calc non Af Amer: 8 mL/min — ABNORMAL LOW (ref >60)
Glucose, Bld: 117 mg/dL — ABNORMAL HIGH (ref 70–99)
Potassium: 3.4 mmol/L — ABNORMAL LOW (ref 3.5–5.1)
Sodium: 140 mmol/L (ref 135–145)
Total Bilirubin: 1.2 mg/dL (ref 0.3–1.2)
Total Protein: 7.7 g/dL (ref 6.5–8.1)

## 2019-11-16 LAB — GLUCOSE, CAPILLARY
Glucose-Capillary: 119 mg/dL — ABNORMAL HIGH (ref 70–99)
Glucose-Capillary: 146 mg/dL — ABNORMAL HIGH (ref 70–99)
Glucose-Capillary: 165 mg/dL — ABNORMAL HIGH (ref 70–99)
Glucose-Capillary: 152 mg/dL — ABNORMAL HIGH (ref 70–99)
Glucose-Capillary: 147 mg/dL — ABNORMAL HIGH (ref 70–99)

## 2019-11-16 LAB — I-STAT CHEM 8, ED
BUN: 34 mg/dL — ABNORMAL HIGH (ref 6–20)
Calcium, Ion: 1.08 mmol/L — ABNORMAL LOW (ref 1.15–1.40)
Chloride: 100 mmol/L (ref 98–111)
Creatinine, Ser: 7.2 mg/dL — ABNORMAL HIGH (ref 0.61–1.24)
Glucose, Bld: 99 mg/dL (ref 70–99)
HCT: 46 % (ref 39.0–52.0)
Hemoglobin: 15.6 g/dL (ref 13.0–17.0)
Potassium: 3.8 mmol/L (ref 3.5–5.1)
Sodium: 138 mmol/L (ref 135–145)
TCO2: 26 mmol/L (ref 22–32)

## 2019-11-16 LAB — CBC WITH DIFFERENTIAL/PLATELET
Abs Immature Granulocytes: 0.4 10*3/uL — ABNORMAL HIGH (ref 0.00–0.07)
Basophils Absolute: 0.1 10*3/uL (ref 0.0–0.1)
Basophils Relative: 1 %
Eosinophils Absolute: 0.2 10*3/uL (ref 0.0–0.5)
Eosinophils Relative: 2 %
HCT: 42.5 % (ref 39.0–52.0)
Hemoglobin: 13.1 g/dL (ref 13.0–17.0)
Immature Granulocytes: 5 %
Lymphocytes Relative: 25 %
Lymphs Abs: 2.2 10*3/uL (ref 0.7–4.0)
MCH: 30.9 pg (ref 26.0–34.0)
MCHC: 30.8 g/dL (ref 30.0–36.0)
MCV: 100.2 fL — ABNORMAL HIGH (ref 80.0–100.0)
Monocytes Absolute: 0.4 10*3/uL (ref 0.1–1.0)
Monocytes Relative: 5 %
Neutro Abs: 5.6 10*3/uL (ref 1.7–7.7)
Neutrophils Relative %: 62 %
Platelets: 170 10*3/uL (ref 150–400)
RBC: 4.24 MIL/uL (ref 4.22–5.81)
RDW: 16.1 % — ABNORMAL HIGH (ref 11.5–15.5)
WBC: 8.8 10*3/uL (ref 4.0–10.5)
nRBC: 0.3 % — ABNORMAL HIGH (ref 0.0–0.2)

## 2019-11-16 LAB — APTT
aPTT: 37 seconds — ABNORMAL HIGH (ref 24–36)
aPTT: 51 seconds — ABNORMAL HIGH (ref 24–36)

## 2019-11-16 LAB — PROTIME-INR
INR: 1.5 — ABNORMAL HIGH (ref 0.8–1.2)
INR: 1.6 — ABNORMAL HIGH (ref 0.8–1.2)
Prothrombin Time: 17.5 seconds — ABNORMAL HIGH (ref 11.4–15.2)
Prothrombin Time: 19.2 seconds — ABNORMAL HIGH (ref 11.4–15.2)

## 2019-11-16 LAB — SAMPLE TO BLOOD BANK

## 2019-11-16 LAB — CBC
HCT: 34 % — ABNORMAL LOW (ref 39.0–52.0)
Hemoglobin: 12.4 g/dL — ABNORMAL LOW (ref 13.0–17.0)
MCH: 35.9 pg — ABNORMAL HIGH (ref 26.0–34.0)
MCHC: 36.5 g/dL — ABNORMAL HIGH (ref 30.0–36.0)
MCV: 98.6 fL (ref 80.0–100.0)
Platelets: 178 10*3/uL (ref 150–400)
RBC: 3.45 MIL/uL — ABNORMAL LOW (ref 4.22–5.81)
RDW: 16.1 % — ABNORMAL HIGH (ref 11.5–15.5)
WBC: 10 10*3/uL (ref 4.0–10.5)
nRBC: 0.3 % — ABNORMAL HIGH (ref 0.0–0.2)

## 2019-11-16 LAB — TROPONIN I (HIGH SENSITIVITY)
Troponin I (High Sensitivity): 171 ng/L (ref <18)
Troponin I (High Sensitivity): 243 ng/L (ref <18)
Troponin I (High Sensitivity): 679 ng/L (ref <18)

## 2019-11-16 LAB — SARS CORONAVIRUS 2 BY RT PCR (HOSPITAL ORDER, PERFORMED IN ~~LOC~~ HOSPITAL LAB): SARS Coronavirus 2: NEGATIVE

## 2019-11-16 LAB — LACTIC ACID, PLASMA
Lactic Acid, Venous: 5.9 mmol/L (ref 0.5–1.9)
Lactic Acid, Venous: 4.4 mmol/L (ref 0.5–1.9)
Lactic Acid, Venous: 1.2 mmol/L (ref 0.5–1.9)
Lactic Acid, Venous: 1.3 mmol/L (ref 0.5–1.9)

## 2019-11-16 LAB — CBG MONITORING, ED
Glucose-Capillary: 35 mg/dL — CL (ref 70–99)
Glucose-Capillary: 134 mg/dL — ABNORMAL HIGH (ref 70–99)

## 2019-11-16 LAB — PROCALCITONIN: Procalcitonin: 0.69 ng/mL

## 2019-11-16 LAB — ETHANOL: Alcohol, Ethyl (B): <10 mg/dL (ref <10)

## 2019-11-16 LAB — TSH: TSH: 7.112 u[IU]/mL — ABNORMAL HIGH (ref 0.350–4.500)

## 2019-11-16 LAB — PHOSPHORUS: Phosphorus: 5.9 mg/dL — ABNORMAL HIGH (ref 2.5–4.6)

## 2019-11-16 LAB — BRAIN NATRIURETIC PEPTIDE: B Natriuretic Peptide: 962.8 pg/mL — ABNORMAL HIGH (ref 0.0–100.0)

## 2019-11-16 LAB — MAGNESIUM: Magnesium: 1.9 mg/dL (ref 1.7–2.4)

## 2019-11-16 MED ORDER — HEPARIN SODIUM (PORCINE) 5000 UNIT/ML IJ SOLN: 5000.0000 [IU] | Freq: Three times a day (TID) | INTRAMUSCULAR | Status: DC

## 2019-11-16 MED ORDER — CISATRACURIUM BOLUS VIA INFUSION
0.1000 mg/kg | Freq: Once | INTRAVENOUS | Status: AC
  Administered 2019-11-16: 8 mg via INTRAVENOUS
  Filled 2019-11-16: qty 8

## 2019-11-16 MED ORDER — MIDAZOLAM 50MG/50ML (1MG/ML) PREMIX INFUSION
2.0000 mg/h | INTRAVENOUS | Status: DC
  Administered 2019-11-16 (×2): 4 mg/h via INTRAVENOUS
  Administered 2019-11-17: 8 mg/h via INTRAVENOUS
  Administered 2019-11-17: 10 mg/h via INTRAVENOUS
  Administered 2019-11-17: 4 mg/h via INTRAVENOUS
  Administered 2019-11-18: 8 mg/h via INTRAVENOUS
  Administered 2019-11-18: 4 mg/h via INTRAVENOUS
  Administered 2019-11-18: 8 mg/h via INTRAVENOUS
  Filled 2019-11-16 (×8): qty 50

## 2019-11-16 MED ORDER — FENTANYL CITRATE (PF) 100 MCG/2ML IJ SOLN: 100.0000 ug | Freq: Once | INTRAMUSCULAR | Status: DC

## 2019-11-16 MED ORDER — DEXTROSE 50 % IV SOLN
INTRAVENOUS | Status: AC
  Administered 2019-11-16: 13:00:00
  Filled 2019-11-16: qty 50

## 2019-11-16 MED ORDER — ARTIFICIAL TEARS OPHTHALMIC OINT
1.0000 "application " | TOPICAL_OINTMENT | Freq: Three times a day (TID) | OPHTHALMIC | Status: DC
  Administered 2019-11-16 – 2019-11-22 (×16): 1 via OPHTHALMIC
  Filled 2019-11-16 (×4): qty 3.5

## 2019-11-16 MED ORDER — AMIODARONE HCL IN DEXTROSE 360-4.14 MG/200ML-% IV SOLN
60.0000 mg/h | INTRAVENOUS | Status: DC
  Administered 2019-11-16: 60 mg/h via INTRAVENOUS

## 2019-11-16 MED ORDER — HEPARIN (PORCINE) 25000 UT/250ML-% IV SOLN
1700.0000 [IU]/h | INTRAVENOUS | Status: DC
  Administered 2019-11-16: 650 [IU]/h via INTRAVENOUS
  Administered 2019-11-18: 800 [IU]/h via INTRAVENOUS
  Administered 2019-11-19: 1050 [IU]/h via INTRAVENOUS
  Administered 2019-11-20: 1400 [IU]/h via INTRAVENOUS
  Administered 2019-11-21 – 2019-11-25 (×6): 1500 [IU]/h via INTRAVENOUS
  Administered 2019-11-27: 1600 [IU]/h via INTRAVENOUS
  Administered 2019-11-28: 1700 [IU]/h via INTRAVENOUS
  Administered 2019-11-28: 1650 [IU]/h via INTRAVENOUS
  Administered 2019-11-29 – 2019-11-30 (×3): 1700 [IU]/h via INTRAVENOUS
  Filled 2019-11-16 (×20): qty 250

## 2019-11-16 MED ORDER — NOREPINEPHRINE 4 MG/250ML-% IV SOLN
INTRAVENOUS | Status: AC
  Administered 2019-11-16: 18 ug/kg/min via INTRAVENOUS
  Filled 2019-11-16: qty 250

## 2019-11-16 MED ORDER — MIDAZOLAM BOLUS VIA INFUSION
2.0000 mg | INTRAVENOUS | Status: DC | PRN
  Filled 2019-11-16: qty 2

## 2019-11-16 MED ORDER — CISATRACURIUM BOLUS VIA INFUSION
0.0500 mg/kg | INTRAVENOUS | Status: DC | PRN
  Filled 2019-11-16: qty 4

## 2019-11-16 MED ORDER — NOREPINEPHRINE 4 MG/250ML-% IV SOLN
0.0000 ug/min | INTRAVENOUS | Status: DC
  Administered 2019-11-16: 12:00:00 18 ug/kg/min via INTRAVENOUS
  Administered 2019-11-17: 6 ug/min via INTRAVENOUS
  Filled 2019-11-16: qty 250

## 2019-11-16 MED ORDER — AMIODARONE HCL IN DEXTROSE 360-4.14 MG/200ML-% IV SOLN
30.0000 mg/h | INTRAVENOUS | Status: DC
  Administered 2019-11-16: 60 mg/h via INTRAVENOUS
  Administered 2019-11-17: 30 mg/h via INTRAVENOUS
  Filled 2019-11-16 (×2): qty 200

## 2019-11-16 MED ORDER — PANTOPRAZOLE SODIUM 40 MG IV SOLR
40.0000 mg | Freq: Every day | INTRAVENOUS | Status: DC
  Administered 2019-11-16 – 2019-11-21 (×6): 40 mg via INTRAVENOUS
  Filled 2019-11-16 (×6): qty 40

## 2019-11-16 MED ORDER — ASPIRIN 300 MG RE SUPP
300.0000 mg | RECTAL | Status: AC
  Administered 2019-11-16: 300 mg via RECTAL
  Filled 2019-11-16: qty 1

## 2019-11-16 MED ORDER — PROPOFOL 1000 MG/100ML IV EMUL
INTRAVENOUS | Status: AC
  Administered 2019-11-16: 25 ug/kg/min
  Filled 2019-11-16: qty 100

## 2019-11-16 MED ORDER — AMIODARONE LOAD VIA INFUSION: 150.0000 mg | Freq: Once | INTRAVENOUS | Status: DC

## 2019-11-16 MED ORDER — FENTANYL 2500MCG IN NS 250ML (10MCG/ML) PREMIX INFUSION
0.0000 ug/h | INTRAVENOUS | Status: DC
  Administered 2019-11-16 – 2019-11-19 (×3): 100 ug/h via INTRAVENOUS
  Administered 2019-11-20: 30 ug/h via INTRAVENOUS
  Administered 2019-11-21: 100 ug/h via INTRAVENOUS
  Administered 2019-11-22 (×2): 150 ug/h via INTRAVENOUS
  Filled 2019-11-16 (×9): qty 250

## 2019-11-16 MED ORDER — CHLORHEXIDINE GLUCONATE CLOTH 2 % EX PADS
6.0000 | MEDICATED_PAD | Freq: Every day | CUTANEOUS | Status: DC
  Administered 2019-11-18 – 2019-12-05 (×18): 6 via TOPICAL

## 2019-11-16 MED ORDER — SODIUM CHLORIDE 0.9 % IV SOLN
INTRAVENOUS | Status: DC
  Administered 2019-11-16: 1000 mL via INTRAVENOUS
  Administered 2019-11-24: 09:00:00 via INTRAVENOUS

## 2019-11-16 MED ORDER — CHLORHEXIDINE GLUCONATE 0.12% ORAL RINSE (MEDLINE KIT)
15.0000 mL | Freq: Two times a day (BID) | OROMUCOSAL | Status: DC
  Administered 2019-11-16 – 2019-12-23 (×72): 15 mL via OROMUCOSAL

## 2019-11-16 MED ORDER — MIDAZOLAM HCL 2 MG/2ML IJ SOLN
2.0000 mg | Freq: Once | INTRAMUSCULAR | Status: AC
  Administered 2019-11-22: 2 mg via INTRAVENOUS
  Filled 2019-11-16: qty 2

## 2019-11-16 MED ORDER — FENTANYL CITRATE (PF) 100 MCG/2ML IJ SOLN
INTRAMUSCULAR | Status: AC
  Administered 2019-11-16: 100 ug
  Filled 2019-11-16: qty 2

## 2019-11-16 MED ORDER — IOHEXOL 350 MG/ML SOLN
100.0000 mL | Freq: Once | INTRAVENOUS | Status: AC | PRN
  Administered 2019-11-16: 100 mL via INTRAVENOUS

## 2019-11-16 MED ORDER — ORAL CARE MOUTH RINSE
15.0000 mL | OROMUCOSAL | Status: DC
  Administered 2019-11-16 – 2019-12-23 (×341): 15 mL via OROMUCOSAL

## 2019-11-16 MED ORDER — SODIUM CHLORIDE 0.9 % IV SOLN
1.0000 ug/kg/min | INTRAVENOUS | Status: DC
  Administered 2019-11-16: 1 ug/kg/min via INTRAVENOUS
  Administered 2019-11-18: 1.5 ug/kg/min via INTRAVENOUS
  Filled 2019-11-16 (×2): qty 20

## 2019-11-16 MED ORDER — FENTANYL BOLUS VIA INFUSION
50.0000 ug | INTRAVENOUS | Status: DC | PRN
  Administered 2019-11-19 – 2019-11-21 (×8): 50 ug via INTRAVENOUS
  Filled 2019-11-16: qty 50

## 2019-11-16 MED ORDER — HEPARIN BOLUS VIA INFUSION
3500.0000 [IU] | Freq: Once | INTRAVENOUS | Status: DC
  Filled 2019-11-16: qty 3500

## 2019-11-16 MED ORDER — AMIODARONE HCL IN DEXTROSE 360-4.14 MG/200ML-% IV SOLN
INTRAVENOUS | Status: AC
  Filled 2019-11-16: qty 200

## 2019-11-16 NOTE — Progress Notes (Signed)
EEG complete - results pending 

## 2019-11-16 NOTE — Progress Notes (Signed)
CCM at bedside and notified of high PIP 60-80. Bedside bronch performed. High PIP okay per MD at this time. Cric airway is positional per MD. RT will continue to monitor.

## 2019-11-16 NOTE — Progress Notes (Signed)
Critical ABG results given to MD. °

## 2019-11-16 NOTE — Progress Notes (Signed)
A-line attempted by 2 RTs, x 2 attempts each.  Unable to obtain a-line.

## 2019-11-16 NOTE — Progress Notes (Signed)
Pt arrived via EMS post CPR. EMS unable to ventilate pt via kings airway and cric airway was placed in field. Pt easy to BVM. Pt has coarse crackles throughout both lungs and is more diminished on the left. PIP between 50-60. MD aware. Cric secured by EMS cric airway holder and secure. Omniflex in place.

## 2019-11-16 NOTE — Progress Notes (Signed)
Pt transported to CT then to [place] room 13 without any apparent complications. Report given to receiving RT.

## 2019-11-16 NOTE — ED Provider Notes (Signed)
[facility] Emergency Department Provider Note MRN:  [id number]  Arrival date & time: 11/16/19     Chief Complaint   Cardiac arrest History of Present Illness   [name] [name] is a 53 y.o. year-old male with a history of ESRD presenting to the ED with chief complaint of cardiac arrest.  Patient went unresponsive during dialysis.  3 hours into dialysis.  CPR started by my standards quickly.  PEA arrest upon EMS arrival.  Cricothyrotomy performed in the field.  I was unable to obtain an accurate HPI, PMH, or ROS due to the patient's altered mental status.  Level 5 caveat.  Review of Systems  Positive for collapse, cardiac arrest.  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 Echo 8/13: Severe LVH, EF 40-45%, inferoposterior akinesis, grade 2 diastolic dysfunction, moderate LAE, mild RVE, mildly reduced RVSF, mild RAE; cannot rule out R atrial mass-suggest TEE or cardiac MRI  //  B. 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 R/L HC 3/17: Normal coronary arteries, moderate pulmonary hypertension (PASP 65 mmHg), elevated LV filling pressures (LVEDP 45 mmHg)    NSVT (nonsustained ventricular tachycardia) ([facility]) 02/22/2016   NSVT (nonsustained ventricular tachycardia) ([place]) 02/22/2016   Pneumonia 11/12/2017   Polysubstance abuse ([place])    Prolonged Q-T interval on ECG 05/18/2017   Prolonged QT interval 05/18/2017   Restless leg syndrome    Solitary pulmonary nodule 10/10/2015   See cxr 10/09/2015 - CT rec 10/10/2015 >>>    Upper airway cough syndrome 10/09/2015   Off ACEi around 1st Oct 2016  - Sinus CT 10/10/2015 >>>      Past Surgical History:  Procedure Laterality Date   [place]  01/19/2013   Procedure: BASCILIC VEIN TRANSPOSITION;  Surgeon: Conrad Chesterfield, MD;  Location: [place];  Service: Vascular;  Laterality: Left;  left 2nd stage basilic vein transposition   CARDIAC CATHETERIZATION N/A 02/25/2016   Procedure: Right/Left Heart Cath and Coronary Angiography;  Surgeon: Burnell Blanks, MD;  Location: [facility] CV [facility];  Service: Cardiovascular;  Laterality: N/A;   INGUINAL HERNIA REPAIR Right 08/05/2016   Procedure: RIGHT INGUINAL HERNIA REPAIR WITH MESH;  Surgeon: Donnie Mesa, MD;  Location: [place];  Service: General;  Laterality: Right;   INSERTION OF DIALYSIS CATHETER  07/17/2012   Procedure:  INSERTION OF DIALYSIS CATHETER;  Surgeon: Conrad [place], MD;  Location: [place];  Service: Vascular;  Laterality: Right;  right internal jugular   INSERTION OF MESH Right 08/05/2016   Procedure: INSERTION OF MESH;  Surgeon: Donnie Mesa, MD;  Location: [place];  Service: General;  Laterality: Right;   REVISION OF ARTERIOVENOUS GORETEX GRAFT Left 7/84/6962   Procedure: PLICATION OF ARTERIOVENOUS FISTULA LEFT ARM;  Surgeon: Elam Dutch, MD;  Location: [place];  Service:  Vascular;  Laterality: Left;   REVISON OF ARTERIOVENOUS FISTULA Left 01/06/2017   Procedure: REVISON OF ARTERIOVENOUS FISTULA;  Surgeon: Conrad [place], MD;  Location: [place];  Service: Vascular;  Laterality: Left;   REVISON OF ARTERIOVENOUS FISTULA Left 10/27/2018   Procedure: REVISION PLICATION OF ARTERIOVENOUS FISTULA  LEFT UPPER ARM;  Surgeon: Waynetta Sandy, MD;  Location: [place];  Service: Vascular;  Laterality: Left;   UMBILICAL HERNIA REPAIR N/A 08/05/2016   Procedure: [place];  Surgeon: Donnie Mesa, MD;  Location: [place];  Service: General;  Laterality: N/A;   VENOGRAM N/A 08/09/2012   Procedure: VENOGRAM;  Surgeon: Conrad [place]ak[place], MD;  Location: [facility] CATH LAB;  Service: Cardiovascular;  Laterality: N/A;    Family History  Problem Relation Age of Onset   Hypertension Mother    Diabetes Mother    Renal Disease Mother    Hypertension Father     Social History   Socioeconomic History   Marital status: Single    Spouse name: Not on file   Number of children: Not on file   Years of education: Not on file   Highest education level: Not on file  Occupational History   Not on file  Social Needs   Financial resource strain: Not on file   Food insecurity    Worry: Not on file    Inability: Not on file   Transportation needs    Medical: Not on file    Non-medical: Not on file  Tobacco Use   Smoking status: Former Smoker    Years: 30.00    Types: Cigarettes    Quit date: 10/20/2018    Years since quitting: 1.0   Smokeless tobacco: Never Used  Substance and Sexual Activity   Alcohol use: Yes    Alcohol/week: 4.0 standard drinks    Types: 4 Cans of beer per week   Drug use: No    Types: Marijuana    Comment: 05/18/2017 "qd"   Sexual activity: Not Currently  Lifestyle   Physical activity    Days per week: Not on file    Minutes per session: Not on file   Stress: Not on file  Relationships   Social connections    Talks  on phone: Not on file    Gets together: Not on file    Attends religious service: Not on file    Active member of club or organization: Not on file    Attends meetings of clubs or organizations: Not on file    Relationship status: Not on file   Intimate partner violence    Fear of current or ex partner: Not on file    Emotionally abused: Not on file    Physically abused: Not on file    Forced sexual activity: Not on file  Other Topics Concern   Not on file  Social History Narrative   Not on file     Physical Exam  Vital Signs and Nursing Notes reviewed Vitals:  11/16/19 1145 11/16/19 1200  BP: (!) 118/92 112/82  Pulse:    Resp: (!) 24 (!) 25  Temp: (!) 96.9 F (36.1 C) (!) 97.5 F (36.4 C)    CONSTITUTIONAL: Ill-appearing NEURO: Nonpurposeful movements, largely unresponsive EYES:  eyes equal and reactive ENT/NECK:  no LAD, no JVD, cricothyrotomy of the anterior neck CARDIO: Tachycardic rate, cold and poorly perfused PULM:  CTAB no wheezing or rhonchi GI/GU: Significant abdominal distention MSK/SPINE:  No gross deformities, no edema SKIN:  no rash, atraumatic PSYCH: Unable to assess  Diagnostic and Interventional Summary    EKG Interpretation  Date/Time:  Wednesday November 16 2019 10:54:42 EST Ventricular Rate:  136 PR Interval:    QRS Duration: 114 QT Interval:  351 QTC Calculation: 528 R Axis:   -166 Text Interpretation: Atrial fibrillation Ventricular premature complex IRBBB and LPFB Borderline low voltage, extremity leads Abnormal lateral Q waves Minimal ST elevation, inferior leads Prolonged QT interval Artifact in lead(s) I II III aVR aVL aVF V3 V4 V5 V6 Confirmed by Gerlene Fee [phone]) on 11/16/2019 11:55:38 AM      Labs Reviewed  CBC WITH DIFFERENTIAL/PLATELET - Abnormal; Notable for the following components:      Result Value   MCV 100.2 (*)    RDW 16.1 (*)    nRBC 0.3 (*)    Abs Immature Granulocytes 0.40 (*)    All other components within  normal limits  LACTIC ACID, PLASMA - Abnormal; Notable for the following components:   Lactic Acid, Venous 5.9 (*)    All other components within normal limits  I-STAT CHEM 8, ED - Abnormal; Notable for the following components:   BUN 34 (*)    Creatinine, Ser 7.20 (*)    Calcium, Ion 1.08 (*)    All other components within normal limits  POCT I-STAT 7, (LYTES, BLD GAS, ICA,H+H) - Abnormal; Notable for the following components:   pH, Arterial 7.227 (*)    pCO2 arterial 65.7 (*)    pO2, Arterial 349.0 (*)    All other components within normal limits  SARS CORONAVIRUS 2 BY RT PCR (HOSPITAL ORDER, [facility])  COMPREHENSIVE METABOLIC PANEL  LACTIC ACID, PLASMA  SAMPLE TO BLOOD BANK  TROPONIN I (HIGH SENSITIVITY)    DG Chest Portable 1 View  Final Result      Medications  propofol (DIPRIVAN) 1000 MG/100ML infusion (45 mcg/kg/min  Rate/Dose Change 11/16/19 1205)  fentaNYL (SUBLIMAZE) 100 MCG/2ML injection (100 mcg  Given 11/16/19 1107)  norepinephrine (LEVOPHED) 4-5 MG/250ML-% infusion SOLN (18 mcg/kg/min  New Bag/Given 11/16/19 1204)     Procedures  /  Critical Care .Critical Care Performed by: Maudie Flakes, MD Authorized by: Maudie Flakes, MD   Critical care provider statement:    Critical care time (minutes):  45   Critical care was necessary to treat or prevent imminent or life-threatening deterioration of the following conditions:  Circulatory failure and respiratory failure (Cardiac arrest)   Critical care was time spent personally by me on the following activities:  Discussions with consultants, evaluation of patient's response to treatment, examination of patient, ordering and performing treatments and interventions, ordering and review of laboratory studies, ordering and review of radiographic studies, pulse oximetry, re-evaluation of patient's condition, obtaining history from patient or surrogate and review of old charts    ED Course and  Medical Decision Making  I have reviewed the triage vital signs and the nursing notes.  Pertinent labs & imaging results that  were available during my care of the patient were reviewed by me and considered in my medical decision making (see below for details).     Cardiac arrest at dialysis, favoring sudden cardiac arrhythmia.  Rapid bystander CPR, found to be in PEA arrest with EMS.  Total of 17 minutes of CPR, 4 epinephrines, amp bicarb, and calcium.  EMS had great difficulty with bag-valve-mask, patient was "clamped down" and unable to receive oral airway.  Cricothyrotomy performed in the field.  ROSC obtained.  On arrival patient is tachycardic in the 539N, systolic blood pressure 225, strong peripheral pulses put cold.  Spontaneous breathing, good oxygenation with the cric illness original position.  Patient provided with propofol and fentanyl for sedation and pain control.  Patient's heart rate improving to the 90s, blood pressure decreasing to the 83M systolic.  Starting norepinephrine drip.  Admitted to intensivist service for further care.  We will also consult ENT given the cricothyrotomy.  Barth Kirks. Sedonia Small, [place] [email or web address]  Final Clinical Impressions(s) / ED Diagnoses     ICD-10-CM   1. Cardiac arrest [facility])  I46.9     ED Discharge Orders    None       Discharge Instructions Discussed with and Provided to Patient:   Discharge Instructions   None       Maudie Flakes, MD 11/16/19 1212

## 2019-11-16 NOTE — Procedures (Signed)
Central Venous Catheter Insertion Procedure Note [name] [name] [id number] [birth year]  Procedure: Insertion of Central Venous Catheter Indications: Assessment of intravascular volume, Drug and/or fluid administration and Frequent blood sampling  Procedure Details Consent: Unable to obtain consent because of altered level of consciousness. Time Out: Verified patient identification, verified procedure, site/side was marked, verified correct patient position, special equipment/implants available, medications/allergies/relevent history reviewed, required imaging and test results available.  Performed  Maximum sterile technique was used including antiseptics, cap, gloves, gown, hand hygiene, mask and sheet. Skin prep: Chlorhexidine; local anesthetic administered A antimicrobial bonded/coated triple lumen catheter was placed in the right subclavian vein using the Seldinger technique.   Evaluation Blood flow good Complications: No apparent complications Patient did tolerate procedure well. Chest X-ray ordered to verify placement.  CXR: pending.  Johnsie Cancel, NP-C [facility] After hours pager: [phone]. 11/16/2019, 6:09 PM

## 2019-11-16 NOTE — Progress Notes (Signed)
vLTM started  Events button tested.  Rn shown use of event button ,  Neurology notified

## 2019-11-16 NOTE — Progress Notes (Signed)
Chaplain responded to consult for family support. Chaplain assisted parents in visiting Decklyn in Trauma room A. Father is designated visitor/support person for the visit. Chaplain handed off family contact information to the RN in the ED. Chaplains remain available for support as needs arise.   Chaplain Resident, Evelene Croon, M Div

## 2019-11-16 NOTE — Progress Notes (Signed)
ANTICOAGULATION CONSULT NOTE - Initial Consult  Pharmacy Consult for heparin Indication: atrial fibrillation and pulmonary embolus  Allergies  Allergen Reactions  . Losartan Cough    Patient Measurements: Height: 5' 10.5" (179.1 cm) Weight: 176 lb 5.9 oz (80 kg) IBW/kg (Calculated) : 74.15  Vital Signs: Temp: 99.2 F (37.3 C) (12/02 1500) BP: 97/86 (12/02 1500) Pulse Rate: 52 (12/02 1520)  Labs: Recent Labs    11/16/19 1105  11/16/19 1116 11/16/19 1141 11/16/19 1238 11/16/19 1310  HGB 15.6   < > 13.1  --  12.4* 13.3  HCT 46.0   < > 42.5  --  34.0* 39.0  PLT  --   --  170  --  178  --   APTT  --   --   --   --  37*  --   LABPROT  --   --   --   --  17.5*  --   INR  --   --   --   --  1.5*  --   CREATININE 7.20*  --  7.01*  --   --   --   TROPONINIHS  --   --   --  171* 243*  --    < > = values in this interval not displayed.    Estimated Creatinine Clearance: 13.1 mL/min (A) (by C-G formula based on SCr of 7.01 mg/dL (H)).  Assessment: CC/HPI: 53 yo m presenting post-cardiac arrest from HD - ROSC after 15 min; emergency cric done in field   PMH: NSVT CHF PSA ESRD  Anticoag: none pta - new onset afib on EKG; CTA highly suspicious for PE   CV: witnessed arrest TTM 12/2; new onset afib  Neuro: CTH neg  Renal: ESRD  Heme/Onc: H&H 13.3/39, PLT 178  Goal of Therapy:  Heparin level 0.3-0.7 units/ml Monitor platelets by anticoagulation protocol: Yes   Plan:  Heparin bolus 2500 units x 1  Heparin infusion 650 units/hr Lower doses of above since initiating TTM Initial HL 0100 Daily HL CBC F/U closely during warming  Barth Kirks, PharmD, BCPS, BCCCP Clinical Pharmacist [phone]  Please check AMION for all [place] numbers  11/16/2019 5:38 PM

## 2019-11-16 NOTE — Consult Note (Signed)
Cardiology Consultation:   Patient ID: [name] [name]; [id number]; December 18, 1965   Admit date: 11/16/2019 Date of Consult: 11/16/2019  Primary Care Provider: Jean Rosenthal, MD Primary Cardiologist: Fransico Him, MD Primary Electrophysiologist:  None   Patient Profile:   [name] [name] is a 53 y.o. 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 Lynetta Mare.  History of Present Illness:   [name] [name] is currently ventilated and sedated - unable to participate in history, therefore history obtained through chart review.   He was in his usual state of health until this morning when he suffered a PEA arrest while at dialysis. He reportedly 3 hours into his dialysis session when he became unresponsive. He was found to be pulseless and CPR was initiated by a bystander. EMS was activated and he was found to be inmEq of bicarb with ROSC after 15 minutes. EMS was unable PEA arrest. He received 3 rounds of epi, 1g of Ca, and 50  to intubate with a King airway in the field and emergent cricothyrotomy was performed. On arrival to the ED, he was maintaining sinus rhythm. He was admitted to PCCM with cooling protocol initiated.   He was last seen by cardiology during an admission to the hospital 05/2017 for acute on chronic combined CHF. His last echo 10/2018 showed EF 25-30%, G2DD, severe LAE, moderate RAE, mild-moderate TR, and mild pulmHTN. He was deemed to be a poor candidate for ICD in the past due to non-compliance and drug abuse. His last ischemic evaluation was a [facility] 02/2016 without CAD and moderate pulmHTN. He was subsequently lost to follow-up.   Past Medical History:  Diagnosis Date  . Abdominal distension 08/10/2018  . Alcohol abuse 11/26/2010   Qualifier: Diagnosis of  By: [name] MD, Pershing Proud   . Anemia due to blood loss, acute 07/15/2012  . CHF (congestive heart failure) ([place])   . Cholecystitis 04/18/2014  . Cigarette smoker    has currently quit  . Cocaine abuse ([place]) 11/26/2010   Qualifier: Diagnosis of  By: [name] MD, Benjamine Mola  has been over a year  . Combined congestive systolic and diastolic heart failure ([place]) 04/18/2014   A. Echo 8/13: Severe LVH, EF 40-45%, inferoposterior akinesis, grade 2 diastolic dysfunction, moderate LAE, mild RVE, mildly reduced RVSF, mild RAE; cannot rule out R atrial mass-suggest TEE or cardiac MRI  //  B. Echo 3/17: Mild LVH, EF 25-30%, diffuse HK, grade 2 diastolic dysfunction, mild MR, severe LAE,  moderately reduced RVSF, severe RAE, mild TR, moderate PI, PASP 55 mmHg    . Dyspnea   . ESRD (end stage renal disease) on dialysis [facility])    MWF- [place] (05/18/2017)  . Hemodialysis patient ([place])    M,W,F  . Hernia, inguinal, right 08/05/2016  . Hypertension   . 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 Hypocalcemia 07/15/2012  . Medical non-compliance   . Non-ischemic cardiomyopathy ([place])    A. R/L HC 3/17: Normal coronary arteries, moderate pulmonary hypertension (PASP 65 mmHg), elevated LV filling pressures (LVEDP 45 mmHg)   . NSVT (nonsustained ventricular tachycardia) ([place]) 02/22/2016  . NSVT (nonsustained ventricular tachycardia) ([place]) 02/22/2016  . Pneumonia 11/12/2017  .  Polysubstance abuse ([place])   . Prolonged Q-T interval on ECG 05/18/2017  . Prolonged QT interval 05/18/2017  . Restless leg syndrome   . Solitary pulmonary nodule 10/10/2015   See cxr 10/09/2015 - CT rec 10/10/2015 >>>   . Upper airway cough syndrome 10/09/2015   Off ACEi around 1st Oct 2016  - Sinus CT 10/10/2015 >>>      Past Surgical History:  Procedure Laterality Date  . [place] TRANSPOSITION  01/19/2013   Procedure: BASCILIC VEIN TRANSPOSITION;   Surgeon: Conrad Rockwall, MD;  Location: [place];  Service: Vascular;  Laterality: Left;  left 2nd stage basilic vein transposition  . CARDIAC CATHETERIZATION N/A 02/25/2016   Procedure: Right/Left Heart Cath and Coronary Angiography;  Surgeon: Burnell Blanks, MD;  Location: [place] CV LAB;  Service: Cardiovascular;  Laterality: N/A;  . INGUINAL HERNIA REPAIR Right 08/05/2016   Procedure: RIGHT INGUINAL HERNIA REPAIR WITH MESH;  Surgeon: Donnie Mesa, MD;  Location: [place];  Service: General;  Laterality: Right;  . INSERTION OF DIALYSIS CATHETER  07/17/2012   Procedure: INSERTION OF DIALYSIS CATHETER;  Surgeon: Conrad [place], MD;  Location: [place];  Service: Vascular;  Laterality: Right;  right internal jugular  . INSERTION OF MESH Right 08/05/2016   Procedure: INSERTION OF MESH;  Surgeon: Donnie Mesa, MD;  Location: [place];  Service: General;  Laterality: Right;  . REVISION OF ARTERIOVENOUS GORETEX GRAFT Left 6/71/2458   Procedure: PLICATION OF ARTERIOVENOUS FISTULA LEFT ARM;  Surgeon: Elam Dutch, MD;  Location: [place];  Service: Vascular;  Laterality: Left;  . REVISON OF ARTERIOVENOUS FISTULA Left 01/06/2017   Procedure: REVISON OF ARTERIOVENOUS FISTULA;  Surgeon: Conrad Aldan, MD;  Location: [place];  Service: Vascular;  Laterality: Left;  . REVISON OF ARTERIOVENOUS FISTULA Left 10/27/2018   Procedure: REVISION PLICATION OF ARTERIOVENOUS FISTULA  LEFT UPPER ARM;  Surgeon: Waynetta Sandy, MD;  Location: [place];  Service: Vascular;  Laterality: Left;  . UMBILICAL HERNIA REPAIR N/A 08/05/2016   Procedure: [place];  Surgeon: Donnie Mesa, MD;  Location: [place];  Service: General;  Laterality: N/A;  . VENOGRAM N/A 08/09/2012   Procedure: VENOGRAM;  Surgeon: Conrad Loomis, MD;  Location: [facility] CATH LAB;  Service: Cardiovascular;  Laterality: N/A;     Home Medications:  Prior to Admission medications   Medication Sig Start Date End Date Taking? Authorizing  Provider  calcium acetate (PHOSLO) 667 MG capsule Take 3 capsules (2,001 mg total) by mouth 3 (three) times daily with meals. Patient taking differently: Take 1,334-3,335 mg by mouth See admin instructions. Take 3-5 capsules (2001 - 3335 mg) by mouth 3 times daily with large meals, take 2 capsules (1334 mg) with snacks 04/21/14   Mikhail, Velta Addison, DO  carvedilol (COREG) 25 MG tablet Take 1 tablet (25 mg total) by mouth 2 (two) times daily. 05/20/17   Geradine Girt, DO  clonazePAM (KLONOPIN) 0.5 MG tablet Take 1 tablet (0.5 mg total) by mouth as needed. Patient taking differently: Take 0.5 mg by mouth every Monday, Wednesday, and Friday with hemodialysis.  09/17/18   Jean Rosenthal, MD  HYDROcodone-acetaminophen (NORCO) 5-325 MG tablet Take 1 tablet by mouth every 6 (six) hours as needed for moderate pain. 08/02/19   Dagoberto Ligas, PA-C  isosorbide mononitrate (IMDUR) 60 MG 24 hr tablet Take 1 tablet (60 mg total) by mouth daily. 05/20/17   Geradine Girt, DO    Inpatient Medications: Scheduled Meds: .  artificial tears  1 application Both Eyes Q3F  . aspirin  300 mg Rectal NOW  . chlorhexidine gluconate (MEDLINE KIT)  15 mL Mouth Rinse BID  . fentaNYL (SUBLIMAZE) injection  100 mcg Intravenous Once  . heparin  5,000 Units Subcutaneous Q8H  . mouth rinse  15 mL Mouth Rinse 10 times per day  . midazolam  2 mg Intravenous Once  . pantoprazole (PROTONIX) IV  40 mg Intravenous QHS   Continuous Infusions: . sodium chloride    . cisatracurium (NIMBEX) infusion 1 mcg/kg/min (11/16/19 1416)  . fentaNYL infusion INTRAVENOUS 100 mcg/hr (11/16/19 1340)  . midazolam 4 mg/hr (11/16/19 1341)  . norepinephrine (LEVOPHED) Adult infusion 20 mcg/min (11/16/19 1449)   PRN Meds: [COMPLETED] cisatracurium **AND** cisatracurium (NIMBEX) infusion **AND** cisatracurium, fentaNYL, midazolam  Allergies:    Allergies  Allergen Reactions  . Losartan Cough    Social History:   Social History   Socioeconomic  History  . Marital status: Single    Spouse name: Not on file  . Number of children: Not on file  . Years of education: Not on file  . Highest education level: Not on file  Occupational History  . Not on file  Social Needs  . Financial resource strain: Not on file  . Food insecurity    Worry: Not on file    Inability: Not on file  . Transportation needs    Medical: Not on file    Non-medical: Not on file  Tobacco Use  . Smoking status: Former Smoker    Years: 30.00    Types: Cigarettes    Quit date: 10/20/2018    Years since quitting: 1.0  . Smokeless tobacco: Never Used  Substance and Sexual Activity  . Alcohol use: Yes    Alcohol/week: 4.0 standard drinks    Types: 4 Cans of beer per week  . Drug use: No    Types: Marijuana    Comment: 05/18/2017 "qd"  . Sexual activity: Not Currently  Lifestyle  . Physical activity    Days per week: Not on file    Minutes per session: Not on file  . Stress: Not on file  Relationships  . Social Herbalist on phone: Not on file    Gets together: Not on file    Attends religious service: Not on file    Active member of club or organization: Not on file    Attends meetings of clubs or organizations: Not on file    Relationship status: Not on file  . Intimate partner violence    Fear of current or ex partner: Not on file    Emotionally abused: Not on file    Physically abused: Not on file    Forced sexual activity: Not on file  Other Topics Concern  . Not on file  Social History Narrative  . Not on file    Family History:    Family History  Problem Relation Age of Onset  . Hypertension Mother   . Diabetes Mother   . Renal Disease Mother   . Hypertension Father      ROS:  Please see the history of present illness.  ROS  All other ROS reviewed and negative.     Physical Exam/Data:   Vitals:   11/16/19 1418 11/16/19 1430 11/16/19 1445 11/16/19 1500  BP: (!) 104/91 108/90 95/74 97/86   Pulse:      Resp: (!)  24 (!) 24 14 14   Temp: 99.4 F (37.4  C) 99.4 F (37.4 C) 99.3 F (37.4 C) 99.2 F (37.3 C)  Weight:      Height:       No intake or output data in the 24 hours ending 11/16/19 1529 Filed Weights   11/16/19 1300  Weight: 80 kg   Body mass index is 24.95 kg/m.  Physical exam per MD below  EKG:  The EKG was personally reviewed and demonstrates:  Atrial fibrillation, rate 96bpm, multifocal PVC, no STE/D.  Telemetry:  Telemetry was personally reviewed and demonstrates:  Atrial fibrillation with CVR with frequent ectopy  Relevant CV Studies: Echocardiogram 10/2018: Study Conclusions  - Left ventricle: The cavity size was mildly dilated. Systolic   function was severely reduced. The estimated ejection fraction was in the range of 25% to 30%. Diffuse hypokinesis. Although no diagnostic regional wall motion abnormality was identified, this possibility cannot be completely excluded on the basis of this study. Features are consistent with a pseudonormal left ventricular filling pattern, with concomitant abnormal relaxation and increased filling pressure (grade 2 diastolic dysfunction). - Aortic valve: Transvalvular velocity was within the normal range. There was no stenosis. There was no regurgitation. - Mitral valve: Transvalvular velocity was within the normal range. There was no evidence for stenosis. There was trivial   regurgitation. - Left atrium: The atrium was severely dilated. - Right ventricle: The cavity size was moderately dilated. Wall   thickness was normal. Systolic function was moderately reduced. RV systolic pressure (S, est): 47 mm Hg. - Right atrium: The atrium was moderately dilated. - Tricuspid valve: There was mild-moderate regurgitation. - Pulmonic valve: There was trivial regurgitation. - Pulmonary arteries: The main pulmonary artery was mildly dilated. Systolic pressure was moderately increased. PA peak pressure: 47 mm Hg (S). - Inferior vena cava: The vessel was  dilated. The respirophasic diameter changes were blunted (< 50%), consistent with elevated central venous pressure. - Pericardium, extracardiac: There was no pericardial effusion.  Impressions:  - Compared to the previous exam 02/21/2016, no significant change has occured. There is severe LV dysfunction with LVEF 25-30%. Moderate-severe pulmonary hypertension with RVSP 47 mmHg and estimated RA pressure at least 15 mmHg. Moderate RV enlargement with moderately reduced function. Mild-moderate tricuspid valve regurgutation. Trivial pulmonary valve regurgutation. No pericardial effusion.  Left heart catheterization 2017: 1. No angiographic evidence of CAD 2. Moderate pulmonary HTN 3. Elevated LV filling pressures.   Recommendations: Would consider aggressive volume removal with HD.   Laboratory Data:  Chemistry Recent Labs  Lab 11/16/19 1105 11/16/19 1115 11/16/19 1116 11/16/19 1310  NA 138 136 140 138  K 3.8 5.1 3.4* 3.8  CL 100  --  100  --   CO2  --   --  23  --   GLUCOSE 99  --  117*  --   BUN 34*  --  23*  --   CREATININE 7.20*  --  7.01*  --   CALCIUM  --   --  9.6  --   GFRNONAA  --   --  8*  --   GFRAA  --   --  10*  --   ANIONGAP  --   --  17*  --     Recent Labs  Lab 11/16/19 1116  PROT 7.7  ALBUMIN 3.5  AST 30  ALT 17  ALKPHOS 98  BILITOT 1.2   Hematology Recent Labs  Lab 11/16/19 1116 11/16/19 1238 11/16/19 1310  WBC 8.8 10.0  --   RBC 4.24 3.45*  --  HGB 13.1 12.4* 13.3  HCT 42.5 34.0* 39.0  MCV 100.2* 98.6  --   MCH 30.9 35.9*  --   MCHC 30.8 36.5*  --   RDW 16.1* 16.1*  --   PLT 170 178  --    Cardiac EnzymesNo results for input(s): TROPONINI in the last 168 hours. No results for input(s): TROPIPOC in the last 168 hours.  BNPNo results for input(s): BNP, PROBNP in the last 168 hours.  DDimer No results for input(s): DDIMER in the last 168 hours.  Radiology/Studies:  Dg Chest Portable 1 View  Result Date: 11/16/2019 CLINICAL DATA:  Post  CPR.  Tracheostomy tube. EXAM: PORTABLE CHEST 1 VIEW COMPARISON:  Radiographs 10/26/2018 and 12/31/2017. FINDINGS: 1057 hours. Tip of the tracheostomy tube is just below the thoracic inlet. The heart is enlarged. There is vascular congestion with increasing right greater than left perihilar opacities, probably reflecting edema. There is no confluent airspace opacity, pleural effusion or pneumothorax. No acute fractures are seen. IMPRESSION: 1. Worsened right greater than left perihilar opacities most consistent with edema (versus atypical inflammation). 2. Tip of the tracheostomy tube in the proximal trachea, just below the thoracic inlet. Electronically Signed   By: Richardean Sale M.D.   On: 11/16/2019 11:16    Assessment and Plan:   1. PEA arrest: occurred at HD today with immediate bystander CPR with ROSC achieved after 15 minutes with 1 round of epi, Ca, and bicarb). Etiology unclear but was suspected to have RML and LLL PE on CT chest, though no large central or lobar PE's noted, likely supporting pulmonary etiology. He had normal coronaries on LHC in 2017, making obstructive CAD less likely. - Continue cooling protocol per primary team - No plans for ischemic evaluation at this time - Will defer anticoagulation to PCCM for management of PEs  2. Acute respiratory failure: he underwent emergent cricothyrotomy by EMS prior to arrival. CT Chest with RML and LLL PE, large right pleural effusion with new loculated collection of fluid at the base the left hemithorax, and collapse/consolidation in the RML and RLL possibly 2/2 aspiration. Currently ventilated  - Continue management per primary team  3. New onset atrial fibrillation: noted post resuscitation. Rates fairly well controlled. LHC in 2017 without CAD. TSH pending.  - This patients CHA2DS2-VASc Score and unadjusted Ischemic Stroke Rate (% per year) is equal to 3.2 % stroke rate/year from a score of 3 Above score calculated as 1 point each if  present [CHF, HTN, DM, Vascular=MI/PAD/Aortic Plaque, Age if 65-74, or Male] Above score calculated as 2 points each if present [Age > 75, or Stroke/TIA/TE] - Consider heparin gtt for stroke ppx  4. Chronic combined CHF: EF 25-30% with G2DD on echo 10/2018. Not a candidate for ICD given non-compliance and drug abuse. Not on goal directed therapy due to underlying renal disease. Volume managed with HD.  - Continue volume management per nephrology - Resume BBlocker as BP will allow  5. HTN: BP soft but stable post-arrest.  - Continue to monitor and resume BBlocker as BP will allow  6. ESRD on HD: on dialysis M/W/F.  - Continue management per nephrology     For questions or updates, please contact [place] Please consult [email or web address] for contact info under Cardiology/STEMI.   Signed, Abigail Butts, PA-C  11/16/2019 3:29 PM [phone]

## 2019-11-16 NOTE — H&P (Signed)
NAME:  [name] [name], MRN:  [id number], DOB:  Jul 28, 1966, LOS: 0 ADMISSION DATE:  11/16/2019, CONSULTATION DATE:  12/2 REFERRING MD:  Dr. Sedonia Small, CHIEF COMPLAINT:  Post cardiac arrest    Brief History   53yo male presented post arrest which occurred during iHD treatment. This was a witnessed arrest with quick bystander CPR. Received 3 rounds of epi, 1g of calcium and 50 mEq of bicarb with ROSC after 15 minutes.   History of present illness   HPI obtained from medical chart review as patient is currently intubated on mechanical ventilation.  53 year old male with extensive PMH significant for but not limited to NSVT, combined systolic and diastolic CHF, polysubstance abuse, and  end-stage renal disease who presented to the ED after cardiac arrest.  Patient had presented to outpatient hemodialysis appointment 3-1/2 hours into HD treatment when patient suffered a witnessed cardiac arrest. On arrival to EMS patients rhythm was PEA.  Patient received 3 rounds of epi, 1g of calcium and 50 mEq of bicarb with ROSC after 15 minutes. Emergent cricothyrotomy performed by EMS due to failed intubation with [facility] airway.   Vital signs stable on admission, has remained in sinus rhythm since ROSC.  ABG on admission pH 7.227 / pCO2 65.7 / pO2 349.0 / bicarb 27.3. Troponin 171, creatinine 7.01 and  lactic acid 5.9.   PCCM consulted for admission   Past Medical History  NSVT 2017 Nonischemic cardiomyopathy Medical noncompliance Polysubstance abuse Hypertension End-stage renal disease Combined systolic and diastolic congestive heart failure Cocaine abuse Alcohol abuse Prolonged QTC  Significant Hospital Events   12/2 admitted post cardiac arrest  Consults:  Cardiology Nephrology  Procedures:  12/2 remeasuring cricothyrotomy per EMS  Significant Diagnostic Tests:  CXR 12/2 > Worsened right greater than left perihilar opacities most consistent with edema (versus atypical inflammation). Tip of the  tracheostomy tube in the proximal trachea, just below the thoracic inlet.  Micro Data:  COVID 12/2 > pending  Antimicrobials:    Interim history/subjective:  Unresponsive on ventilator.   Objective   Blood pressure (!) 133/115, pulse (!) 32, resp. rate 20, height 5' 10.5" (1.791 m).    Vent Mode: PRVC FiO2 (%):  [100 %] 100 % Set Rate:  [28 bmp] 28 bmp Vt Set:  [590 mL] 590 mL PEEP:  [8 cmH20] 8 cmH20  No intake or output data in the 24 hours ending 11/16/19 1155 There were no vitals filed for this visit.  Examination: General: Chronically ill appearing male on mechanical ventilation, in NAD HEENT: emergent trach midline, bleeding from nose and mouth seen, pupils pinpoint and non-reactive  Neuro: unresponsive  CV: irregularly irregular rate and rhythm, no murmur, rubs, or gallops,  PULM:  Rhonchi bilaterally  GI: soft, bowel sounds active in all 4 quadrants, non-tender, non-distended Extremities: warm/dry, no edema  Skin: no rashes or lesions  Resolved Hospital Problem list     Assessment & Plan:  Cardiac arrest -[place] etiology, pt has hx of combined systolic and diastolic CHF, NSVT, prolonged QT, and polysubstance abuse  Circulatory shock -~15 minutes downtime, witnessed arrest with quick initiation of bystander CPR P: Start TTM, goal 33 degrees. Place CVL, arterial line Continue levophed PRN, goal MAP > 65 Assess CVP's, echo, UDS. Trend troponin, lactate Cardiology following, appreciate assistance  Hold preadmission coreg and Imdur medication   Acute hypoxic respiratory failure Emergent cricothyrotomy   -In the setting of post cardiac arrest  P: Continue ventilator support with lung protective strategies  Wean PEEP and FiO2  for sats greater than 90%. Head of bed elevated 30 degrees. Plateau pressures less than 30 cm H20.  Follow intermittent chest x-ray and ABG.   Hold SBT until off NMB. Ensure adequate pulmonary hygiene  Follow cultures  VAP bundle in  place  PAD protocol ENT consult due to emergent cricothyrotomy   New onset A-fib HX NSVT HX Prolonged QT -EKG on admission with A-fib and prolonged QT P: Continuous telemetry  Repeat 12 lead in AM Obtain TSH Cardiology consulted    Chronic combined systolic and diastolic congestive heart failure Nonischemic cardiomyopathy  -ECHO 11/10/18 with EF of 25-30% and grade 2 diastolicdysfunction  -Right and left heart cath 02/25/2016 with no angiographic evidence of CAD P: Obtain ECHO Strict I&O Hold antihypertensives due to circulatory shock Trend BNP Cardiology consult   At risk for anoxic encephalopathy. -In the setting of cardiac arrest  P: Sedation with fentanyl and versed during TTM RASS goal: -5 until off NMB Hold daily WUA until off NMB Assess EEG Neuro consult once rewarmed   At risk for multiple metabolic derangements during cooling. P: NS @ 50 Correct electrolytes as indicated. BMP q2hrs x 4 then q4hrs  At risk for hyperglycemia during cooling. P: ICU hyperglycemia protocol.  ESRD -Undergoing iHD at time of arrest, assuming regular schedule is MWF P: Consult nephrology Trend Bmet Close monitoring of volume status   Hypertension -Home medications includes Coreg and Imdur  P: Currently requiring pressor support home home antihypertensives for now   Polysubstance abuse -Hx of cocaine, alcohol, and tobacco abuse P: Tox screen PAD protocol    Best practice:  Diet: NPO Pain/Anxiety/Delirium protocol (if indicated): Fentanyl and versed  VAP protocol (if indicated): In place  DVT prophylaxis: Subq heparin GI prophylaxis: PPI Glucose control: SSI Mobility: Bedrest Code Status: Full Family Communication: Updated  Disposition: ICU  Labs   CBC: Recent Labs  Lab 11/16/19 1105 11/16/19 1115 11/16/19 1116  WBC  --   --  8.8  NEUTROABS  --   --  5.6  HGB 15.6 14.3 13.1  HCT 46.0 42.0 42.5  MCV  --   --  100.2*  PLT  --   --  170    Basic  Metabolic Panel: Recent Labs  Lab 11/16/19 1105 11/16/19 1115  NA 138 136  K 3.8 5.1  CL 100  --   GLUCOSE 99  --   BUN 34*  --   CREATININE 7.20*  --    GFR: CrCl cannot be calculated (Unknown ideal weight.). Recent Labs  Lab 11/16/19 1116  WBC 8.8    Liver Function Tests: No results for input(s): AST, ALT, ALKPHOS, BILITOT, PROT, ALBUMIN in the last 168 hours. No results for input(s): LIPASE, AMYLASE in the last 168 hours. No results for input(s): AMMONIA in the last 168 hours.  ABG    Component Value Date/Time   PHART 7.227 (L) 11/16/2019 1115   PCO2ART 65.7 (HH) 11/16/2019 1115   PO2ART 349.0 (H) 11/16/2019 1115   HCO3 27.3 11/16/2019 1115   TCO2 29 11/16/2019 1115   ACIDBASEDEF 2.0 11/16/2019 1115   O2SAT 100.0 11/16/2019 1115     Coagulation Profile: No results for input(s): INR, PROTIME in the last 168 hours.  Cardiac Enzymes: No results for input(s): CKTOTAL, CKMB, CKMBINDEX, TROPONINI in the last 168 hours.  HbA1C: Hgb A1c MFr Bld  Date/Time Value Ref Range Status  03/10/2011 12:02 PM (H) <5.7 % Final   5.7 (NOTE)  According to the ADA Clinical Practice Recommendations for 2011, when HbA1c is used as a screening test:   >=6.5%   Diagnostic of Diabetes Mellitus           (if abnormal result  is confirmed)  5.7-6.4%   Increased risk of developing Diabetes Mellitus  References:Diagnosis and Classification of Diabetes Mellitus,Diabetes NIOE,7035,00(XFGHW 1):S62-S69 and Standards of Medical Care in         Diabetes - 2011,Diabetes EXHB,7169,67  (Suppl 1):S11-S61.    CBG: No results for input(s): GLUCAP in the last 168 hours.  Review of Systems:   Unable to assess due to unresponsiveness   Past Medical History  He,  has a past medical history of Abdominal distension (08/10/2018), Alcohol abuse (11/26/2010), Anemia due to blood loss, acute (07/15/2012), CHF (congestive heart failure) ([place]),  Cholecystitis (04/18/2014), Cigarette smoker, Cocaine abuse ([place]) (11/26/2010), Combined congestive systolic and diastolic heart failure ([place]) (04/18/2014), Dyspnea, ESRD (end stage renal disease) on dialysis [facility]), Hemodialysis patient ([place]), Hernia, inguinal, right (08/05/2016), Hypertension, 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   Surgical History    Past Surgical History:  Procedure Laterality Date  . [place] TRANSPOSITION  01/19/2013   Procedure: BASCILIC VEIN TRANSPOSITION;  Surgeon: Conrad Anna, MD;  Location: [place];  Service: Vascular;  Laterality: Left;  left 2nd stage basilic vein transposition  . CARDIAC CATHETERIZATION N/A 02/25/2016   Procedure: Right/Left Heart Cath and Coronary Angiography;  Surgeon: Burnell Blanks, MD;  Location: [place] CV LAB;  Service: Cardiovascular;  Laterality: N/A;  . INGUINAL HERNIA REPAIR Right 08/05/2016   Procedure: RIGHT INGUINAL HERNIA REPAIR WITH MESH;  Surgeon: Donnie Mesa, MD;  Location: [place];  Service: General;  Laterality: Right;  . INSERTION OF DIALYSIS CATHETER  07/17/2012   Procedure: INSERTION OF DIALYSIS CATHETER;  Surgeon: Conrad Covel, MD;  Location: [place];  Service: Vascular;  Laterality: Right;  right internal jugular  . INSERTION OF MESH Right 08/05/2016   Procedure: INSERTION OF MESH;  Surgeon: Donnie Mesa, MD;  Location: [place];  Service: General;  Laterality: Right;  . REVISION OF ARTERIOVENOUS GORETEX GRAFT Left 8/93/8101    Procedure: PLICATION OF ARTERIOVENOUS FISTULA LEFT ARM;  Surgeon: Elam Dutch, MD;  Location: [place];  Service: Vascular;  Laterality: Left;  . REVISON OF ARTERIOVENOUS FISTULA Left 01/06/2017   Procedure: REVISON OF ARTERIOVENOUS FISTULA;  Surgeon: Conrad Torboy, MD;  Location: [place];  Service: Vascular;  Laterality: Left;  . REVISON OF ARTERIOVENOUS FISTULA Left 10/27/2018   Procedure: REVISION PLICATION OF ARTERIOVENOUS FISTULA  LEFT UPPER ARM;  Surgeon: Waynetta Sandy, MD;  Location: [place];  Service: Vascular;  Laterality: Left;  . UMBILICAL HERNIA REPAIR N/A 08/05/2016   Procedure: [place];  Surgeon: Donnie Mesa, MD;  Location: [place];  Service: General;  Laterality: N/A;  . VENOGRAM N/A 08/09/2012   Procedure: VENOGRAM;  Surgeon: Conrad Blackfoot, MD;  Location: [facility] CATH LAB;  Service: Cardiovascular;  Laterality: N/A;     Social History   reports that he quit smoking about 12 months ago. His smoking use included cigarettes. He quit after 30.00 years of use.  He has never used smokeless tobacco. He reports current alcohol use of about 4.0 standard drinks of alcohol per week. He reports that he does not use drugs.   Family History   His family history includes Diabetes in his mother; Hypertension in his father and mother; Renal Disease in his mother.   Allergies Allergies  Allergen Reactions  . Losartan Cough     Home Medications  Prior to Admission medications   Medication Sig Start Date End Date Taking? Authorizing Provider  calcium acetate (PHOSLO) 667 MG capsule Take 3 capsules (2,001 mg total) by mouth 3 (three) times daily with meals. Patient taking differently: Take 1,334-3,335 mg by mouth See admin instructions. Take 3-5 capsules (2001 - 3335 mg) by mouth 3 times daily with large meals, take 2 capsules (1334 mg) with snacks 04/21/14   Mikhail, Velta Addison, DO  carvedilol (COREG) 25 MG tablet Take 1 tablet (25 mg total) by mouth 2 (two) times  daily. 05/20/17   Geradine Girt, DO  clonazePAM (KLONOPIN) 0.5 MG tablet Take 1 tablet (0.5 mg total) by mouth as needed. Patient taking differently: Take 0.5 mg by mouth every Monday, Wednesday, and Friday with hemodialysis.  09/17/18   Jean Rosenthal, MD  HYDROcodone-acetaminophen (NORCO) 5-325 MG tablet Take 1 tablet by mouth every 6 (six) hours as needed for moderate pain. 08/02/19   Dagoberto Ligas, PA-C  isosorbide mononitrate (IMDUR) 60 MG 24 hr tablet Take 1 tablet (60 mg total) by mouth daily. 05/20/17   Geradine Girt, DO     Critical care time:    CRITICAL CARE Performed by: Johnsie Cancel   Total critical care time: 50 minutes  Critical care time was exclusive of separately billable procedures and treating other patients.  Critical care was necessary to treat or prevent imminent or life-threatening deterioration.  Critical care was time spent personally by me on the following activities: development of treatment plan with patient and/or surrogate as well as nursing, discussions with consultants, evaluation of patient's response to treatment, examination of patient, obtaining history from patient or surrogate, ordering and performing treatments and interventions, ordering and review of laboratory studies, ordering and review of radiographic studies, pulse oximetry and re-evaluation of patient's condition.  Signature:    Johnsie Cancel, NP-C [facility] After hours pager: [phone]. 11/16/2019, 1:58 PM

## 2019-11-16 NOTE — ED Notes (Signed)
Ice packs applied to pt

## 2019-11-16 NOTE — Procedures (Signed)
Arterial Catheter Insertion Procedure Note [name] [name] [id number] 1966/01/25  Procedure: Insertion of Arterial Catheter  Indications: Blood pressure monitoring and Frequent blood sampling  Procedure Details Consent: Unable to obtain consent because of emergent medical necessity. Time Out: Verified patient identification, verified procedure, site/side was marked, verified correct patient position, special equipment/implants available, medications/allergies/relevent history reviewed, required imaging and test results available.  Performed  Maximum sterile technique was used including antiseptics, cap, gloves, gown, hand hygiene, mask and sheet. Skin prep: Chlorhexidine; local anesthetic administered 22 gauge catheter was inserted into left femoral artery using the Seldinger technique. ULTRASOUND GUIDANCE USED: YES Evaluation Blood flow good; BP tracing good. Complications: No apparent complications.  Attempts at right brachial and axillary arterial line were aborted due to vessel tortuosity.  [name] [name] [name] [name] 11/16/2019

## 2019-11-16 NOTE — Progress Notes (Signed)
Patient was received on unit at approximately 1600. Dr. Lynetta Mare placed an arterial line in the left femoral artery. A 12 lead ECG had been obtained in the ED. Patient was continually iced at neck, groin and axillas as permitted by a-line placement procedure. Initial labs were taken and sent.

## 2019-11-16 NOTE — Progress Notes (Signed)
RT called to look at femoral Aline not working correctly.  Fem. Line had blood backed up and wasn't able to flush. New line was setup and site was flushed easily and new line was hooked up.  Aline tracing is on the monitor and blood pulls back easily. CVP line set up as well per RN request.  RT will continue to monitor.

## 2019-11-16 NOTE — ED Notes (Signed)
Cleaned patient of stool. Profuse bleeding still intermittently coming from mouth and nose. Suctioning frequently.

## 2019-11-16 NOTE — ED Triage Notes (Signed)
Pt here from dialysis-3.5 hrs intp tx when he had witnessed arrest. CPR in progress on EMS arrival-PEA initial rhythm. 3 epi, 1g calcium, and 50 meq bicarb given. ROSC after 17 mins. NSR after ROSC. Cric airway placed by EMS d/t failed king airway. Arrives to ED with strong pulses and 100% O2 with BVM.

## 2019-11-16 NOTE — Consult Note (Signed)
[name] [name] Admit Date: 11/16/2019 11/16/2019 [name] [name] Requesting Physician:  [name] Mare MD  Reason for Consult:  ESRD, cardiac arrest HPI:  74M ESRD MWF [name] [name] Kitchen who had witnessed cardiac arrest near end of HD today.  Prolonged CPR in field and ED, req emergent cricothyrotomy.  After eventual ROSC, now for therapeutic cooling.  Also found to have AFib.    PMH includes NICM, hx/o PSA/Cocaine, ETOH, HTN.   HD Rx: [place] MWF, 4h 8mn; 2/2.5 bath; 450/1.5; F180; UFP 4; IVB Hep 5 k qTx; C3 0.75 qTx; no ESA/Fe; Hb 12s usually  Intake labs here K 3.4, HCO3 23, BUN 23.    Outpt HD was uneventful prior to being found unresponsive.   Unable to complete balance of 12 systems PMH  Past Medical History:  Diagnosis Date  . Abdominal distension 08/10/2018  . Alcohol abuse 11/26/2010   Qualifier: Diagnosis of  By: MAmil AmenMD, EPershing Proud . Anemia due to blood loss, acute 07/15/2012  . CHF (congestive heart failure) ([place]   . Cholecystitis 04/18/2014  . Cigarette smoker    has currently quit  . Cocaine abuse ([place] 11/26/2010   Qualifier: Diagnosis of  By: MAmil AmenMD, EBenjamine Mola has been over a year  . Combined congestive systolic and diastolic heart failure ([place] 04/18/2014   A. Echo 8/13: Severe LVH, EF 40-45%, inferoposterior akinesis, grade 2 diastolic dysfunction, moderate LAE, mild RVE, mildly reduced RVSF, mild RAE; cannot rule out R atrial mass-suggest TEE or cardiac MRI  //  B. Echo 3/17: Mild LVH, EF 25-30%, diffuse HK, grade 2 diastolic dysfunction, mild MR, severe LAE,  moderately reduced RVSF, severe RAE, mild TR, moderate PI, PASP 55 mmHg    . Dyspnea   . ESRD (end stage renal disease) on dialysis ([facility]    MWF- [place] (05/18/2017)  . Hemodialysis patient ([facility]    M,W,F  . Hernia, inguinal, right 08/05/2016  . Hypertension   . 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 Hypocalcemia 07/15/2012  . Medical non-compliance   . Non-ischemic cardiomyopathy ([place]    A. R/L HC 3/17: Normal coronary arteries, moderate pulmonary hypertension (PASP 65 mmHg), elevated LV filling pressures (LVEDP 45 mmHg)   . NSVT (nonsustained ventricular tachycardia) ([place] 02/22/2016  . NSVT (nonsustained ventricular tachycardia) ([place] 02/22/2016  . Pneumonia 11/12/2017  . Polysubstance abuse ([place]   . Prolonged Q-T interval on ECG 05/18/2017  . Prolonged QT interval 05/18/2017  . Restless leg syndrome   . Solitary pulmonary nodule 10/10/2015   See cxr 10/09/2015 - CT rec 10/10/2015 >>>   . Upper airway cough syndrome 10/09/2015   Off ACEi around 1st Oct 2016  - Sinus CT 10/10/2015 >>>     PSH  Past Surgical History:  Procedure Laterality Date  . BD[place]ter[place]SPOSITION  01/19/2013   Procedure: BASCILIC VEIN TRANSPOSITION;  Surgeon: BConrad [place] MD;  [place]  Service: Vascular;  Laterality: Left;  left 2nd stage basilic vein transposition  . CARDIAC CATHETERIZATION N/A 02/25/2016   Procedure: Right/Left Heart Cath and Coronary Angiography;  Surgeon: CBurnell Blanks MD;  [place];  Service: Cardiovascular;  Laterality: N/A;  . INGUINAL HERNIA REPAIR Right 08/05/2016   Procedure: RIGHT INGUINAL HERNIA REPAIR WITH MESH;  Surgeon: MDonnie Mesa MD;  [place]  Service: General;  Laterality: Right;  . INSERTION  OF DIALYSIS CATHETER  07/17/2012   Procedure: INSERTION OF DIALYSIS CATHETER;  Surgeon: Conrad [place], MD;  Location: [place];  Service: Vascular;  Laterality: Right;  right internal jugular  . INSERTION OF MESH Right 08/05/2016   Procedure: INSERTION OF MESH;  Surgeon: Donnie Mesa, MD;  Location: [place];  Service: General;  Laterality: Right;  . REVISION OF ARTERIOVENOUS GORETEX GRAFT Left 03/31/4080   Procedure: PLICATION OF ARTERIOVENOUS FISTULA LEFT ARM;  Surgeon: Elam Dutch, MD;  Location: [place];  Service:  Vascular;  Laterality: Left;  . REVISON OF ARTERIOVENOUS FISTULA Left 01/06/2017   Procedure: REVISON OF ARTERIOVENOUS FISTULA;  Surgeon: Conrad [place], MD;  Location: [place];  Service: Vascular;  Laterality: Left;  . REVISON OF ARTERIOVENOUS FISTULA Left 10/27/2018   Procedure: REVISION PLICATION OF ARTERIOVENOUS FISTULA  LEFT UPPER ARM;  Surgeon: Waynetta Sandy, MD;  Location: [place];  Service: Vascular;  Laterality: Left;  . UMBILICAL HERNIA REPAIR N/A 08/05/2016   Procedure: [place];  Surgeon: Donnie Mesa, MD;  Location: [place];  Service: General;  Laterality: N/A;  . VENOGRAM N/A 08/09/2012   Procedure: VENOGRAM;  Surgeon: Conrad Fisher, MD;  Location: [facility] C[facility];  Service: Cardiovascular;  Laterality: N/A;   FH  Family History  Problem Relation Age of Onset  . Hypertension Mother   . Diabetes Mother   . Renal Disease Mother   . Hypertension Father    SH  reports that he quit smoking about 12 months ago. His smoking use included cigarettes. He quit after 30.00 years of use. He has never used smokeless tobacco. He reports current alcohol use of about 4.0 standard drinks of alcohol per week. He reports that he does not use drugs. Allergies  Allergies  Allergen Reactions  . Losartan Cough   Home medications Prior to Admission medications   Medication Sig Start Date End Date Taking? Authorizing Provider  calcium acetate (PHOSLO) 667 MG capsule Take 3 capsules (2,001 mg total) by mouth 3 (three) times daily with meals. Patient taking differently: Take 1,334-3,335 mg by mouth See admin instructions. Take 3-5 capsules (2001 - 3335 mg) by mouth 3 times daily with large meals, take 2 capsules (1334 mg) with snacks 04/21/14   [name], Velta Addison, DO  carvedilol (COREG) 25 MG tablet Take 1 tablet (25 mg total) by mouth 2 (two) times daily. 05/20/17   [name] Girt, DO  clonazePAM (KLONOPIN) 0.5 MG tablet Take 1 tablet (0.5 mg total) by mouth as needed. Patient  taking differently: Take 0.5 mg by mouth every Monday, Wednesday, and Friday with hemodialysis.  09/17/18   Jean Rosenthal, MD  HYDROcodone-acetaminophen (NORCO) 5-325 MG tablet Take 1 tablet by mouth every 6 (six) hours as needed for moderate pain. 08/02/19   Dagoberto Ligas, PA-C  isosorbide mononitrate (IMDUR) 60 MG 24 hr tablet Take 1 tablet (60 mg total) by mouth daily. 05/20/17   [name] Girt, DO    Current Medications Scheduled Meds: . artificial tears  1 application Both Eyes K4Y  . aspirin  300 mg Rectal NOW  . chlorhexidine gluconate (MEDLINE KIT)  15 mL Mouth Rinse BID  . fentaNYL (SUBLIMAZE) injection  100 mcg Intravenous Once  . heparin  5,000 Units Subcutaneous Q8H  . mouth rinse  15 mL Mouth Rinse 10 times per day  . midazolam  2 mg Intravenous Once  . pantoprazole (PROTONIX) IV  40 mg Intravenous QHS   Continuous Infusions: . sodium  chloride    . cisatracurium (NIMBEX) infusion 1 mcg/kg/min (11/16/19 1416)  . fentaNYL infusion INTRAVENOUS 100 mcg/hr (11/16/19 1340)  . midazolam 4 mg/hr (11/16/19 1341)  . norepinephrine (LEVOPHED) Adult infusion 20 mcg/min (11/16/19 1449)   PRN Meds:.[COMPLETED] cisatracurium **AND** cisatracurium (NIMBEX) infusion **AND** cisatracurium, fentaNYL, midazolam  CBC Recent Labs  Lab 11/16/19 1116 11/16/19 1238 11/16/19 1310  WBC 8.8 10.0  --   NEUTROABS 5.6  --   --   HGB 13.1 12.4* 13.3  HCT 42.5 34.0* 39.0  MCV 100.2* 98.6  --   PLT 170 178  --    Basic Metabolic Panel Recent Labs  Lab 11/16/19 1105 11/16/19 1115 11/16/19 1116 11/16/19 1310  NA 138 136 140 138  K 3.8 5.1 3.4* 3.8  CL 100  --  100  --   CO2  --   --  23  --   GLUCOSE 99  --  117*  --   BUN 34*  --  23*  --   CREATININE 7.20*  --  7.01*  --   CALCIUM  --   --  9.6  --     Physical Exam  Blood pressure 97/86, pulse (!) 45, temperature 99.2 F (37.3 C), resp. rate 14, height 5' 10.5" (1.791 m), weight 80 kg. GEN: unresponsive, jerking ENT: Cric in  place EYES: eyes closed CV: regular PULM: coarse bs b/l ABD: s, distended SKIN: no rashes/ LUE AVF with needles in place EXT: NO LEE   Assessment 89M ESRD, NICM, s/p cardiac arrest prolonged time until ROSC  1. ESRD MWF AVF [place] 2. Cardiac arrerst 12/2 at HD, prolonged period until ROSC, TTM in proces 3. S/p emergent cricothyrotomy 4. VDRF 5. CKD-BMD, severe chronic hyperphosphatemia 6. ANemia, not major issue  Plan 1. No immediate HD needs, will follow along.     [name] [name]  [phone] pgr 11/16/2019, 3:19 PM

## 2019-11-16 NOTE — Progress Notes (Signed)
Responded to CPR in progress in ED.  Pt in trauma A.  Pt. Witnessed arrest.  EDP has spoken with patient Mother and Father. Patient son and sister are all in consultation room A.  Patient is critical per EDP and scheduled to go to ICU. Per EMS patient was at dialysis and suddenly went unconsciousness and was CPR was started and was  broth to hospital.  Chaplain provided emotional and spiritual support to family and staff.  Chaplain will continue support as needed.  This was passed on to fellow chaplain to follow as needed.  Jaclynn Major, [place], [facility], Pager [phone]

## 2019-11-17 ENCOUNTER — Inpatient Hospital Stay (HOSPITAL_COMMUNITY): Payer: Medicare Other

## 2019-11-17 ENCOUNTER — Encounter (HOSPITAL_COMMUNITY): Payer: Self-pay

## 2019-11-17 DIAGNOSIS — J9600 Acute respiratory failure, unspecified whether with hypoxia or hypercapnia: Secondary | ICD-10-CM | POA: Diagnosis not present

## 2019-11-17 DIAGNOSIS — I469 Cardiac arrest, cause unspecified: Secondary | ICD-10-CM | POA: Diagnosis not present

## 2019-11-17 DIAGNOSIS — I480 Paroxysmal atrial fibrillation: Secondary | ICD-10-CM | POA: Diagnosis not present

## 2019-11-17 DIAGNOSIS — I428 Other cardiomyopathies: Secondary | ICD-10-CM | POA: Diagnosis not present

## 2019-11-17 DIAGNOSIS — I5023 Acute on chronic systolic (congestive) heart failure: Secondary | ICD-10-CM | POA: Diagnosis not present

## 2019-11-17 LAB — POCT I-STAT, CHEM 8
BUN: 32 mg/dL — ABNORMAL HIGH (ref 6–20)
BUN: 32 mg/dL — ABNORMAL HIGH (ref 6–20)
BUN: 32 mg/dL — ABNORMAL HIGH (ref 6–20)
BUN: 33 mg/dL — ABNORMAL HIGH (ref 6–20)
BUN: 35 mg/dL — ABNORMAL HIGH (ref 6–20)
BUN: 36 mg/dL — ABNORMAL HIGH (ref 6–20)
Calcium, Ion: 1.06 mmol/L — ABNORMAL LOW (ref 1.15–1.40)
Calcium, Ion: 1.08 mmol/L — ABNORMAL LOW (ref 1.15–1.40)
Calcium, Ion: 1.08 mmol/L — ABNORMAL LOW (ref 1.15–1.40)
Calcium, Ion: 1.15 mmol/L (ref 1.15–1.40)
Calcium, Ion: 1.19 mmol/L (ref 1.15–1.40)
Calcium, Ion: 1.2 mmol/L (ref 1.15–1.40)
Chloride: 100 mmol/L (ref 98–111)
Chloride: 101 mmol/L (ref 98–111)
Chloride: 102 mmol/L (ref 98–111)
Chloride: 98 mmol/L (ref 98–111)
Chloride: 98 mmol/L (ref 98–111)
Chloride: 99 mmol/L (ref 98–111)
Creatinine, Ser: 8 mg/dL — ABNORMAL HIGH (ref 0.61–1.24)
Creatinine, Ser: 8 mg/dL — ABNORMAL HIGH (ref 0.61–1.24)
Creatinine, Ser: 8.1 mg/dL — ABNORMAL HIGH (ref 0.61–1.24)
Creatinine, Ser: 8.5 mg/dL — ABNORMAL HIGH (ref 0.61–1.24)
Creatinine, Ser: 8.7 mg/dL — ABNORMAL HIGH (ref 0.61–1.24)
Creatinine, Ser: 8.8 mg/dL — ABNORMAL HIGH (ref 0.61–1.24)
Glucose, Bld: 125 mg/dL — ABNORMAL HIGH (ref 70–99)
Glucose, Bld: 127 mg/dL — ABNORMAL HIGH (ref 70–99)
Glucose, Bld: 134 mg/dL — ABNORMAL HIGH (ref 70–99)
Glucose, Bld: 143 mg/dL — ABNORMAL HIGH (ref 70–99)
Glucose, Bld: 167 mg/dL — ABNORMAL HIGH (ref 70–99)
Glucose, Bld: 97 mg/dL (ref 70–99)
HCT: 37 % — ABNORMAL LOW (ref 39.0–52.0)
HCT: 39 % (ref 39.0–52.0)
HCT: 39 % (ref 39.0–52.0)
HCT: 40 % (ref 39.0–52.0)
HCT: 40 % (ref 39.0–52.0)
HCT: 40 % (ref 39.0–52.0)
Hemoglobin: 12.6 g/dL — ABNORMAL LOW (ref 13.0–17.0)
Hemoglobin: 13.3 g/dL (ref 13.0–17.0)
Hemoglobin: 13.3 g/dL (ref 13.0–17.0)
Hemoglobin: 13.6 g/dL (ref 13.0–17.0)
Hemoglobin: 13.6 g/dL (ref 13.0–17.0)
Hemoglobin: 13.6 g/dL (ref 13.0–17.0)
Potassium: 3.5 mmol/L (ref 3.5–5.1)
Potassium: 3.6 mmol/L (ref 3.5–5.1)
Potassium: 3.6 mmol/L (ref 3.5–5.1)
Potassium: 3.6 mmol/L (ref 3.5–5.1)
Potassium: 4.1 mmol/L (ref 3.5–5.1)
Potassium: 4.3 mmol/L (ref 3.5–5.1)
Sodium: 137 mmol/L (ref 135–145)
Sodium: 137 mmol/L (ref 135–145)
Sodium: 138 mmol/L (ref 135–145)
Sodium: 138 mmol/L (ref 135–145)
Sodium: 138 mmol/L (ref 135–145)
Sodium: 139 mmol/L (ref 135–145)
TCO2: 22 mmol/L (ref 22–32)
TCO2: 22 mmol/L (ref 22–32)
TCO2: 23 mmol/L (ref 22–32)
TCO2: 23 mmol/L (ref 22–32)
TCO2: 24 mmol/L (ref 22–32)
TCO2: 27 mmol/L (ref 22–32)

## 2019-11-17 LAB — POCT I-STAT 7, (LYTES, BLD GAS, ICA,H+H)
Acid-Base Excess: 1 mmol/L (ref 0.0–2.0)
Bicarbonate: 26.4 mmol/L (ref 20.0–28.0)
Bicarbonate: 22.4 mmol/L (ref 20.0–28.0)
Calcium, Ion: 1.19 mmol/L (ref 1.15–1.40)
Calcium, Ion: 1.06 mmol/L — ABNORMAL LOW (ref 1.15–1.40)
HCT: 41 % (ref 39.0–52.0)
HCT: 38 % — ABNORMAL LOW (ref 39.0–52.0)
Hemoglobin: 13.9 g/dL (ref 13.0–17.0)
Hemoglobin: 12.9 g/dL — ABNORMAL LOW (ref 13.0–17.0)
O2 Saturation: 96 %
O2 Saturation: 100 %
Patient temperature: 35.7
Potassium: 3.5 mmol/L (ref 3.5–5.1)
Potassium: 3.8 mmol/L (ref 3.5–5.1)
Sodium: 137 mmol/L (ref 135–145)
Sodium: 137 mmol/L (ref 135–145)
TCO2: 28 mmol/L (ref 22–32)
TCO2: 23 mmol/L (ref 22–32)
pCO2 arterial: 42.1 mmHg (ref 32.0–48.0)
pCO2 arterial: 30.4 mmHg — ABNORMAL LOW (ref 32.0–48.0)
pH, Arterial: 7.4 (ref 7.350–7.450)
pH, Arterial: 7.474 — ABNORMAL HIGH (ref 7.350–7.450)
pO2, Arterial: 75 mmHg — ABNORMAL LOW (ref 83.0–108.0)
pO2, Arterial: 170 mmHg — ABNORMAL HIGH (ref 83.0–108.0)

## 2019-11-17 LAB — CBC
HCT: 37 % — ABNORMAL LOW (ref 39.0–52.0)
Hemoglobin: 12.2 g/dL — ABNORMAL LOW (ref 13.0–17.0)
MCH: 30.7 pg (ref 26.0–34.0)
MCHC: 33 g/dL (ref 30.0–36.0)
MCV: 93 fL (ref 80.0–100.0)
Platelets: 125 10*3/uL — ABNORMAL LOW (ref 150–400)
RBC: 3.98 MIL/uL — ABNORMAL LOW (ref 4.22–5.81)
RDW: 15.7 % — ABNORMAL HIGH (ref 11.5–15.5)
WBC: 11.1 10*3/uL — ABNORMAL HIGH (ref 4.0–10.5)
nRBC: 0 % (ref 0.0–0.2)

## 2019-11-17 LAB — GLUCOSE, CAPILLARY
Glucose-Capillary: 103 mg/dL — ABNORMAL HIGH (ref 70–99)
Glucose-Capillary: 104 mg/dL — ABNORMAL HIGH (ref 70–99)
Glucose-Capillary: 104 mg/dL — ABNORMAL HIGH (ref 70–99)
Glucose-Capillary: 119 mg/dL — ABNORMAL HIGH (ref 70–99)
Glucose-Capillary: 120 mg/dL — ABNORMAL HIGH (ref 70–99)
Glucose-Capillary: 122 mg/dL — ABNORMAL HIGH (ref 70–99)
Glucose-Capillary: 128 mg/dL — ABNORMAL HIGH (ref 70–99)
Glucose-Capillary: 137 mg/dL — ABNORMAL HIGH (ref 70–99)
Glucose-Capillary: 65 mg/dL — ABNORMAL LOW (ref 70–99)
Glucose-Capillary: 82 mg/dL (ref 70–99)
Glucose-Capillary: 90 mg/dL (ref 70–99)
Glucose-Capillary: 96 mg/dL (ref 70–99)
Glucose-Capillary: 96 mg/dL (ref 70–99)
Glucose-Capillary: 97 mg/dL (ref 70–99)
Glucose-Capillary: 98 mg/dL (ref 70–99)

## 2019-11-17 LAB — BASIC METABOLIC PANEL
Anion gap: 15 (ref 5–15)
BUN: 37 mg/dL — ABNORMAL HIGH (ref 6–20)
CO2: 21 mmol/L — ABNORMAL LOW (ref 22–32)
Calcium: 8.4 mg/dL — ABNORMAL LOW (ref 8.9–10.3)
Chloride: 100 mmol/L (ref 98–111)
Creatinine, Ser: 8.39 mg/dL — ABNORMAL HIGH (ref 0.61–1.24)
GFR calc Af Amer: 8 mL/min — ABNORMAL LOW (ref >60)
GFR calc non Af Amer: 7 mL/min — ABNORMAL LOW (ref >60)
Glucose, Bld: 110 mg/dL — ABNORMAL HIGH (ref 70–99)
Potassium: 4 mmol/L (ref 3.5–5.1)
Sodium: 136 mmol/L (ref 135–145)

## 2019-11-17 LAB — ECHOCARDIOGRAM COMPLETE
Height: 70.5 in
Weight: 2821.89 oz

## 2019-11-17 LAB — MAGNESIUM: Magnesium: 2 mg/dL (ref 1.7–2.4)

## 2019-11-17 LAB — PROCALCITONIN: Procalcitonin: 24.16 ng/mL

## 2019-11-17 LAB — PHOSPHORUS: Phosphorus: 5.2 mg/dL — ABNORMAL HIGH (ref 2.5–4.6)

## 2019-11-17 LAB — HEPARIN LEVEL (UNFRACTIONATED)
Heparin Unfractionated: 0.12 IU/mL — ABNORMAL LOW (ref 0.30–0.70)
Heparin Unfractionated: 0.28 IU/mL — ABNORMAL LOW (ref 0.30–0.70)
Heparin Unfractionated: 0.24 IU/mL — ABNORMAL LOW (ref 0.30–0.70)

## 2019-11-17 MED ORDER — HEPARIN BOLUS VIA INFUSION
1500.0000 [IU] | Freq: Once | INTRAVENOUS | Status: AC
  Administered 2019-11-17: 1500 [IU] via INTRAVENOUS
  Filled 2019-11-17: qty 1500

## 2019-11-17 MED ORDER — ACETAMINOPHEN 650 MG RE SUPP
650.0000 mg | Freq: Four times a day (QID) | RECTAL | Status: DC | PRN
  Administered 2019-11-17: 650 mg via RECTAL
  Filled 2019-11-17: qty 1

## 2019-11-17 MED ORDER — NOREPINEPHRINE 16 MG/250ML-% IV SOLN
0.0000 ug/min | INTRAVENOUS | Status: DC
  Administered 2019-11-17: 10 ug/min via INTRAVENOUS
  Administered 2019-11-18: 36 ug/min via INTRAVENOUS
  Administered 2019-11-18: 30 ug/min via INTRAVENOUS
  Administered 2019-11-19: 20 ug/min via INTRAVENOUS
  Administered 2019-11-19: 35 ug/min via INTRAVENOUS
  Administered 2019-11-19: 37 ug/min via INTRAVENOUS
  Administered 2019-11-20: 34 ug/min via INTRAVENOUS
  Administered 2019-11-20: 20 ug/min via INTRAVENOUS
  Administered 2019-11-21: 22 ug/min via INTRAVENOUS
  Administered 2019-11-21: 20 ug/min via INTRAVENOUS
  Administered 2019-11-22: 33 ug/min via INTRAVENOUS
  Administered 2019-11-23: 24 ug/min via INTRAVENOUS
  Administered 2019-11-23: 30 ug/min via INTRAVENOUS
  Administered 2019-11-23: 25.6 ug/min via INTRAVENOUS
  Administered 2019-11-23: 30 ug/min via INTRAVENOUS
  Administered 2019-11-24: 22 ug/min via INTRAVENOUS
  Administered 2019-11-24 – 2019-11-25 (×2): 28 ug/min via INTRAVENOUS
  Administered 2019-11-25: 15 ug/min via INTRAVENOUS
  Administered 2019-11-27: 20 ug/min via INTRAVENOUS
  Administered 2019-11-27: 19 ug/min via INTRAVENOUS
  Administered 2019-11-28: 16 ug/min via INTRAVENOUS
  Administered 2019-11-30: 2 ug/min via INTRAVENOUS
  Filled 2019-11-17 (×24): qty 250

## 2019-11-17 MED ORDER — "THROMBI-PAD 3""X3"" EX PADS"
1.0000 | MEDICATED_PAD | Freq: Once | CUTANEOUS | Status: DC
  Filled 2019-11-17: qty 1

## 2019-11-17 MED ORDER — DEXTROSE 50 % IV SOLN
12.5000 g | Freq: Once | INTRAVENOUS | Status: AC
  Administered 2019-11-17: 12.5 g via INTRAVENOUS
  Filled 2019-11-17: qty 50

## 2019-11-17 MED ORDER — PERFLUTREN LIPID MICROSPHERE
1.0000 mL | INTRAVENOUS | Status: DC | PRN
  Administered 2019-11-17: 2 mL via INTRAVENOUS
  Filled 2019-11-17: qty 10

## 2019-11-17 NOTE — Progress Notes (Signed)
NAME:  [name] [name], MRN:  [id number], DOB:  Mar 06, 1966, LOS: 1 ADMISSION DATE:  11/16/2019, CONSULTATION DATE:  12/2 REFERRING MD:  Dr. Sedonia Small, CHIEF COMPLAINT:  Post cardiac arrest    Brief History   53yo male presented post arrest which occurred during iHD treatment. This was a witnessed arrest with quick bystander CPR. Received 3 rounds of epi, 1g of calcium and 50 mEq of bicarb with ROSC after 15 minutes.   History of present illness   HPI obtained from medical chart review as patient is currently intubated on mechanical ventilation.  54 year old male with extensive PMH significant for but not limited to NSVT, combined systolic and diastolic CHF, polysubstance abuse, and  end-stage renal disease who presented to the ED after cardiac arrest.  Patient had presented to outpatient hemodialysis appointment 3-1/2 hours into HD treatment when patient suffered a witnessed cardiac arrest. On arrival to EMS patients rhythm was PEA.  Patient received 3 rounds of epi, 1g of calcium and 50 mEq of bicarb with ROSC after 15 minutes. Emergent cricothyrotomy performed by EMS due to failed intubation with [facility] airway.   Vital signs stable on admission, has remained in sinus rhythm since ROSC.  ABG on admission pH 7.227 / pCO2 65.7 / pO2 349.0 / bicarb 27.3. Troponin 171, creatinine 7.01 and  lactic acid 5.9.   PCCM consulted for admission   Past Medical History  NSVT 2017 Nonischemic cardiomyopathy Medical noncompliance Polysubstance abuse Hypertension End-stage renal disease Combined systolic and diastolic congestive heart failure Cocaine abuse Alcohol abuse Prolonged QTC  Significant Hospital Events   12/2 admitted post cardiac arrest  Consults:  Cardiology Nephrology  Procedures:  12/2 remeasuring cricothyrotomy per EMS  Significant Diagnostic Tests:  CXR 12/2 > Worsened right greater than left perihilar opacities most consistent with edema (versus atypical inflammation). Tip of the  tracheostomy tube in the proximal trachea, just below the thoracic inlet.  Micro Data:  COVID 12/2 > negative  Antimicrobials:    Interim history/subjective:  Currently neuromuscular blockade  Objective   Blood pressure 91/74, pulse (!) 28, temperature (!) 90.7 F (32.6 C), temperature source Bladder, resp. rate 14, height 5' 10.5" (1.791 m), weight 80 kg, SpO2 100 %. CVP:  [3 mmHg-31 mmHg] 17 mmHg  Vent Mode: PRVC FiO2 (%):  [40 %-100 %] 40 % Set Rate:  [14 bmp-28 bmp] 14 bmp Vt Set:  [590 mL] 590 mL PEEP:  [5 cmH20-8 cmH20] 5 cmH20 Plateau Pressure:  [16 cmH20-21 cmH20] 20 cmH20   Intake/Output Summary (Last 24 hours) at 11/17/2019 1039 Last data filed at 11/17/2019 1000 Gross per 24 hour  Intake 1553.4 ml  Output 0 ml  Net 1553.4 ml   Filed Weights   11/16/19 1300  Weight: 80 kg    Examination: General: Obese male in neuromuscular blockade and full mechanical ventilatory support HEENT: Emergency chronic is in place functioning well, Neuro: Currently with neuromuscular blockade on board  CV: Heart sounds are regular PULM: Coarse rhonchi Vent pressure regulated volume control FIO2 40% PEEP 5 RATE 14 VT 590 Plateau pressure 20 No chest x-ray for 11/17/2019 GI: soft, bsx4 active  GU: None Extremities: warm/dry,  edema left bicep graft positive bruit Skin: no rashes or lesions   Resolved Hospital Problem list     Assessment & Plan:  Cardiac arrest -[place] etiology, pt has hx of combined systolic and diastolic CHF, NSVT, prolonged QT, and polysubstance abuse  Circulatory shock -~15 minutes downtime, witnessed arrest with quick initiation of bystander CPR  P: TTM goal 33 degrees Central line and arterial line been placed Vasopressor support Appreciate cardiology's input   Acute hypoxic respiratory failure Emergent cricothyrotomy   -In the setting of post cardiac arrest  P: Full ventilatory support Vent bundle Wean per protocol once off neuromuscular  blockade ENT consult due to emergency chronic performed in field although the cricoid thyrotomy is working well Check chest x-ray to confirm cricotomy is in proper position.    New onset A-fib HX NSVT HX Prolonged QT -EKG on admission with A-fib and prolonged QT P: Continuous telemetry  Repeat 12 lead in AM Obtain TSH Cardiology consulted    Chronic combined systolic and diastolic congestive heart failure Nonischemic cardiomyopathy  -ECHO 11/10/18 with EF of 25-30% and grade 2 diastolicdysfunction  -Right and left heart cath 02/25/2016 with no angiographic evidence of CAD P: Cardiology consult Holding antihypertensives 2D echo  At risk for anoxic encephalopathy. -In the setting of cardiac arrest  P: Standard sedation protocol during TTM Neuromuscular blockade Once warmed will most likely need a neuro consult  At risk for multiple metabolic derangements during cooling. Recent Labs  Lab 11/17/19 0010 11/17/19 0500 11/17/19 0534  K 3.6 4.0 3.8   Recent Labs  Lab 11/17/19 0010 11/17/19 0500 11/17/19 0534  NA 138 136 137    P: KVO fluids in the setting of end-stage renal disease Monitor and correct  electrolytes as needed  At risk for hyperglycemia during cooling. CBG (last 3)  Recent Labs    11/17/19 0605 11/17/19 0655 11/17/19 0914  GLUCAP 103* 96 97    P: Sliding scale insulin protocol  ESRD -Undergoing iHD at time of arrest, assuming regular schedule is MWF P: Nephrology is following Most likely need temporary HD catheter placed and CVVH in the near future  Hypertension -Home medications includes Coreg and Imdur  P: [place] point currently in sinus requiring vasopressors May need to resume oral antihypertensives in the future  Polysubstance abuse -Hx of cocaine, alcohol, and tobacco abuse P: PAD protocol  Best practice:  Diet: NPO Pain/Anxiety/Delirium protocol (if indicated): Fentanyl and versed  VAP protocol (if indicated): In place   DVT prophylaxis: Subq heparin GI prophylaxis: PPI Glucose control: SSI Mobility: Bedrest Code Status: Full Family Communication: Updated  Disposition: ICU  Labs   CBC: Recent Labs  Lab 11/16/19 1116 11/16/19 1238  11/16/19 2004 11/16/19 2148 11/17/19 0010 11/17/19 0500 11/17/19 0534  WBC 8.8 10.0  --   --   --   --  11.1*  --   NEUTROABS 5.6  --   --   --   --   --   --   --   HGB 13.1 12.4*   < > 13.6 13.3 13.3 12.2* 12.9*  HCT 42.5 34.0*   < > 40.0 39.0 39.0 37.0* 38.0*  MCV 100.2* 98.6  --   --   --   --  93.0  --   PLT 170 178  --   --   --   --  125*  --    < > = values in this interval not displayed.    Basic Metabolic Panel: Recent Labs  Lab 11/16/19 1116  11/16/19 1730  11/16/19 1747 11/16/19 2004 11/16/19 2148 11/17/19 0010 11/17/19 0500 11/17/19 0534  NA 140   < >  --    < > 139 138 138 138 136 137  K 3.4*   < >  --    < > 3.6 3.5 3.6 3.6  4.0 3.8  CL 100  --   --   --  98 98 99 102 100  --   CO2 23  --   --   --   --   --   --   --  21*  --   GLUCOSE 117*  --   --   --  125* 167* 143* 127* 110*  --   BUN 23*  --   --   --  32* 33* 32* 32* 37*  --   CREATININE 7.01*  --   --   --  8.00* 8.00* 8.10* 8.80* 8.39*  --   CALCIUM 9.6  --   --   --   --   --   --   --  8.4*  --   MG  --   --  1.9  --   --   --   --   --  2.0  --   PHOS  --   --  5.9*  --   --   --   --   --  5.2*  --    < > = values in this interval not displayed.   GFR: Estimated Creatinine Clearance: 10.9 mL/min (A) (by C-G formula based on SCr of 8.39 mg/dL (H)). Recent Labs  Lab 11/16/19 1101 11/16/19 1116 11/16/19 1238 11/16/19 1346 11/16/19 1357 11/16/19 1740 11/16/19 2005 11/17/19 0500  PROCALCITON  --   --   --   --  0.69  --   --  24.16  WBC  --  8.8 10.0  --   --   --   --  11.1*  LATICACIDVEN 5.9*  --   --  4.4*  --  1.2 1.3  --     Liver Function Tests: Recent Labs  Lab 11/16/19 1116  AST 30  ALT 17  ALKPHOS 98  BILITOT 1.2  PROT 7.7  ALBUMIN 3.5   No  results for input(s): LIPASE, AMYLASE in the last 168 hours. No results for input(s): AMMONIA in the last 168 hours.  ABG    Component Value Date/Time   PHART 7.474 (H) 11/17/2019 0534   PCO2ART 30.4 (L) 11/17/2019 0534   PO2ART 170.0 (H) 11/17/2019 0534   HCO3 22.4 11/17/2019 0534   TCO2 23 11/17/2019 0534   ACIDBASEDEF 2.0 11/16/2019 1115   O2SAT 100.0 11/17/2019 0534     Coagulation Profile: Recent Labs  Lab 11/16/19 1238 11/16/19 2005  INR 1.5* 1.6*    Cardiac Enzymes: No results for input(s): CKTOTAL, CKMB, CKMBINDEX, TROPONINI in the last 168 hours.  HbA1C: Hgb A1c MFr Bld  Date/Time Value Ref Range Status  03/10/2011 12:02 PM (H) <5.7 % Final   5.7 (NOTE)                                                                       According to the ADA Clinical Practice Recommendations for 2011, when HbA1c is used as a screening test:   >=6.5%   Diagnostic of Diabetes Mellitus           (if abnormal result  is confirmed)  5.7-6.4%   Increased risk of developing Diabetes Mellitus  References:Diagnosis and Classification of Diabetes Mellitus,Diabetes  BEEF,0071,21(FXJOI 1):S62-S69 and Standards of Medical Care in         Diabetes - 2011,Diabetes TGPQ,9826,41  (Suppl 1):S11-S61.    CBG: Recent Labs  Lab 11/17/19 0434 11/17/19 0531 11/17/19 0605 11/17/19 0655 11/17/19 0914  GLUCAP 98 104* 103* 96 97     App cct 40 min   Richardson Landry Reighn Kaplan ACNP Acute Care Nurse Practitioner [place] Please consult Amion 11/17/2019, 10:40 AM

## 2019-11-17 NOTE — Procedures (Signed)
Patient Name: [name] [name]  MRN: [id number]  Epilepsy Attending: Lora Havens  Referring Physician/Provider: Merlene Laughter, NP Date: 11/16/2019 Duration: 22.35 minutes  Patient history: 53 year old male status post cardiac arrest on TTM.  EEG to evaluate for seizures.  Level of alertness: Comatose/sedated  AEDs during EEG study: Versed  Technical aspects: This EEG study was done with scalp electrodes positioned according to the 10-20 International system of electrode placement. Electrical activity was acquired at a sampling rate of 500Hz  and reviewed with a high frequency filter of 70Hz  and a low frequency filter of 1Hz . EEG data were recorded continuously and digitally stored.   Description: EEG showed continuous generalized mixed 8 to 13 Hz alpha-beta frequency as well as generalized 5 to 6 Hz theta frequency.  Hyperventilation and photic stimulation were not performed.   Abnormality -Continuous slow, generalized  IMPRESSION: This study is suggestive of profound diffuse encephalopathy, non specific to etiology. No seizures or epileptiform discharges were seen throughout the recording.

## 2019-11-17 NOTE — Progress Notes (Signed)
CCM at bedside to assess pt. MD updated on pt condition, made aware of pt temp/water temp trends. Labs reviewed at bedside with MD. Phillips Climes gtt stopped at this time per MD r/t pt VS. MD also made aware of noted bleeding from nose, mouth, penis and central line insertion site. MD gave verbal order for thrombi-pad. No further changes made at this time.

## 2019-11-17 NOTE — Progress Notes (Signed)
Admit: 11/16/2019 LOS: 1  60M ESRD, NICM, s/p cardiac arrest prolonged time until ROSC  Subjective:  . Sedated, paralyzed, on NE, in 24h cooling period . Labs stable,  K 3.8  12/02 0701 - 12/03 0700 In: 1235.8 [I.V.:1235.8] Out: 0   Filed Weights   11/16/19 1300  Weight: 80 kg    Scheduled Meds: . amiodarone  150 mg Intravenous Once  . artificial tears  1 application Both Eyes E7O  . chlorhexidine gluconate (MEDLINE KIT)  15 mL Mouth Rinse BID  . Chlorhexidine Gluconate Cloth  6 each Topical Daily  . fentaNYL (SUBLIMAZE) injection  100 mcg Intravenous Once  . mouth rinse  15 mL Mouth Rinse 10 times per day  . midazolam  2 mg Intravenous Once  . pantoprazole (PROTONIX) IV  40 mg Intravenous QHS   Continuous Infusions: . sodium chloride Stopped (11/17/19 0836)  . amiodarone 30 mg/hr (11/17/19 1000)  . cisatracurium (NIMBEX) infusion 1 mcg/kg/min (11/17/19 1000)  . fentaNYL infusion INTRAVENOUS 100 mcg/hr (11/17/19 1213)  . heparin 750 Units/hr (11/17/19 1000)  . midazolam 4 mg/hr (11/17/19 1000)  . norepinephrine (LEVOPHED) Adult infusion 3 mcg/min (11/17/19 1000)   PRN Meds:.[COMPLETED] cisatracurium **AND** cisatracurium (NIMBEX) infusion **AND** cisatracurium, fentaNYL, midazolam  Current Labs: reviewed    Physical Exam:  Blood pressure (!) 89/58, pulse (!) 54, temperature (!) 91 F (32.8 C), temperature source Bladder, resp. rate 14, height 5' 10.5" (1.791 m), weight 80 kg, SpO2 100 %. Cricothyrotomy, sedated, paralyzed LUE AVF +B/T [place], nl s1s2 Coarse bs b/l S/mild distension No LEE  HD Rx: [place] MWF, 4h 79mn; 2/2.5 bath; 450/1.5; F180; UFP 4; IVB Hep 5 k qTx; C3 0.75 qTx; no ESA/Fe; Hb 12s usually  A 1. [place] MWF [place]F [place]. Cardiac arrerst 12/2 at HD, prolonged period until ROSC, Cooling in proces 3. S/p emergent cricothyrotomy 4. VDRF 5. CKD-BMD, severe chronic hyperphosphatemia 6. ANemia, not major issue 7. Shock on NE  P . No HD needs right  now, try to get through cooling first and see if has BP to support iHD instead of CRRT . Medication Issues; o Preferred narcotic agents for pain control are hydromorphone, fentanyl, and methadone. Morphine should not be used.  o Baclofen should be avoided o Avoid oral sodium phosphate and magnesium citrate based laxatives / bowel preps    RPearson GrippeMD 11/17/2019, 12:17 PM  Recent Labs  Lab 11/16/19 1116  11/16/19 1730  11/16/19 2148 11/17/19 0010 11/17/19 0500 11/17/19 0534  NA 140   < >  --    < > 138 138 136 137  K 3.4*   < >  --    < > 3.6 3.6 4.0 3.8  CL 100  --   --    < > 99 102 100  --   CO2 23  --   --   --   --   --  21*  --   GLUCOSE 117*  --   --    < > 143* 127* 110*  --   BUN 23*  --   --    < > 32* 32* 37*  --   CREATININE 7.01*  --   --    < > 8.10* 8.80* 8.39*  --   CALCIUM 9.6  --   --   --   --   --  8.4*  --   PHOS  --   --  5.9*  --   --   --  5.2*  --    < > = values in this interval not displayed.   Recent Labs  Lab 11/16/19 1116 11/16/19 1238  11/17/19 0010 11/17/19 0500 11/17/19 0534  WBC 8.8 10.0  --   --  11.1*  --   NEUTROABS 5.6  --   --   --   --   --   HGB 13.1 12.4*   < > 13.3 12.2* 12.9*  HCT 42.5 34.0*   < > 39.0 37.0* 38.0*  MCV 100.2* 98.6  --   --  93.0  --   PLT 170 178  --   --  125*  --    < > = values in this interval not displayed.

## 2019-11-17 NOTE — Progress Notes (Signed)
ANTICOAGULATION CONSULT NOTE   Pharmacy Consult for Heparin Indication: atrial fibrillation and pulmonary embolus  Patient Measurements: Height: 5' 10.5" (179.1 cm) Weight: 176 lb 5.9 oz (80 kg) IBW/kg (Calculated) : 74.15  Vital Signs: Temp: 92.1 F (33.4 C) (12/03 2000) Temp Source: Rectal (12/03 2000) BP: 87/72 (12/03 2000) Pulse Rate: 71 (12/03 2007)  Labs: Recent Labs    11/16/19 1116 11/16/19 1141 11/16/19 1238  11/16/19 1730  11/16/19 2005  11/17/19 0500 11/17/19 0534 11/17/19 0541 11/17/19 1219 11/17/19 1300 11/17/19 1813 11/17/19 2000  HGB 13.1  --  12.4*   < >  --    < >  --    < > 12.2* 12.9*  --  13.6  --  12.6*  --   HCT 42.5  --  34.0*   < >  --    < >  --    < > 37.0* 38.0*  --  40.0  --  37.0*  --   PLT 170  --  178  --   --   --   --   --  125*  --   --   --   --   --   --   APTT  --   --  37*  --   --   --  51*  --   --   --   --   --   --   --   --   LABPROT  --   --  17.5*  --   --   --  19.2*  --   --   --   --   --   --   --   --   INR  --   --  1.5*  --   --   --  1.6*  --   --   --   --   --   --   --   --   HEPARINUNFRC  --   --   --   --   --   --   --   --   --   --  0.12*  --  0.28*  --  0.24*  CREATININE 7.01*  --   --   --   --    < >  --    < > 8.39*  --   --  8.50*  --  8.70*  --   TROPONINIHS  --  171* 243*  --  679*  --   --   --   --   --   --   --   --   --   --    < > = values in this interval not displayed.    Estimated Creatinine Clearance: 10.5 mL/min (A) (by C-G formula based on SCr of 8.7 mg/dL (H)).  Assessment: 53 yo m presenting post-cardiac arrest from HD - ROSC after 15 min; emergency cric done in field. Now to TTM protocol and heparin.  Hep lvl low has had some issues with bleeding so targeting low end of goal  Rewarming beginning this evening  Goal of Therapy:  Heparin level 0.3-0.7 units/ml Monitor platelets by anticoagulation protocol: Yes   Plan:  Slowly increase heparin at 800 units/hr Next lvl  0300  Barth Kirks, PharmD, BCPS, BCCCP Clinical Pharmacist [phone]  Please check AMION for all [place] numbers  11/17/2019 9:10 PM ,

## 2019-11-17 NOTE — Progress Notes (Signed)
  Echocardiogram 2D Echocardiogram has been performed.  [name] [name] 11/17/2019, 9:07 AM

## 2019-11-17 NOTE — Progress Notes (Signed)
[place] Progress Note Patient Name: [name] [name] DOB: Apr 03, 1966 MRN: [id number]   Date of Service  11/17/2019  HPI/Events of Note  Notified of bleeding per trach on suctioning. On discussion with bedside RN, bleeding has since stopped.  eICU Interventions  Continue heparin drip for PE and monitor aPTT as per protocol. Inform eLink if bleeding recurs     Intervention Category Major Interventions: Hemorrhage - evaluation and management  Shona Needles Jamaree Hosier 11/17/2019, 7:45 PM

## 2019-11-17 NOTE — Progress Notes (Signed)
ANTICOAGULATION CONSULT NOTE   Pharmacy Consult for Heparin Indication: atrial fibrillation and pulmonary embolus  Patient Measurements: Height: 5' 10.5" (179.1 cm) Weight: 176 lb 5.9 oz (80 kg) IBW/kg (Calculated) : 74.15  Vital Signs: Temp: 92.1 F (33.4 C) (12/03 1400) Temp Source: Bladder (12/03 1200) BP: 105/81 (12/03 1400) Pulse Rate: 69 (12/03 1300)  Labs: Recent Labs    11/16/19 1116 11/16/19 1141 11/16/19 1238  11/16/19 1730  11/16/19 2005  11/17/19 0010 11/17/19 0500 11/17/19 0534 11/17/19 0541 11/17/19 1219 11/17/19 1300  HGB 13.1  --  12.4*   < >  --    < >  --    < > 13.3 12.2* 12.9*  --  13.6  --   HCT 42.5  --  34.0*   < >  --    < >  --    < > 39.0 37.0* 38.0*  --  40.0  --   PLT 170  --  178  --   --   --   --   --   --  125*  --   --   --   --   APTT  --   --  37*  --   --   --  51*  --   --   --   --   --   --   --   LABPROT  --   --  17.5*  --   --   --  19.2*  --   --   --   --   --   --   --   INR  --   --  1.5*  --   --   --  1.6*  --   --   --   --   --   --   --   HEPARINUNFRC  --   --   --   --   --   --   --   --   --   --   --  0.12*  --  0.28*  CREATININE 7.01*  --   --   --   --    < >  --    < > 8.80* 8.39*  --   --  8.50*  --   TROPONINIHS  --  171* 243*  --  679*  --   --   --   --   --   --   --   --   --    < > = values in this interval not displayed.    Estimated Creatinine Clearance: 10.8 mL/min (A) (by C-G formula based on SCr of 8.5 mg/dL (H)).  Assessment: 53 yo m presenting post-cardiac arrest from HD - ROSC after 15 min; emergency cric done in field. Now to TTM protocol and heparin.  HL nearly therapeutic. Per RN, patient with trickling nose bleed that started ~14:20. Will target lower end of goal.    Goal of Therapy:  Heparin level 0.3-0.7 units/ml Monitor platelets by anticoagulation protocol: Yes   Plan:  Continue Heparin infusion to 750 units/hr Re-check confirmatory heparin level at 6 h  Monitor daily HL, CBC, plt   Monitor for signs/symptoms of bleeding    Benetta Spar, PharmD, BCPS, BCCP Clinical Pharmacist  Please check AMION for all [place] phone numbers After 10:00 PM, call [place]

## 2019-11-17 NOTE — Progress Notes (Signed)
[place] for Heparin Indication: atrial fibrillation and pulmonary embolus  Allergies  Allergen Reactions  . Losartan Cough    Patient Measurements: Height: 5' 10.5" (179.1 cm) Weight: 176 lb 5.9 oz (80 kg) IBW/kg (Calculated) : 74.15  Vital Signs: Temp: 91.9 F (33.3 C) (12/03 0500) Temp Source: Core (Comment) (12/03 0400) BP: 110/79 (12/03 0600) Pulse Rate: 61 (12/03 0333)  Labs: Recent Labs    11/16/19 1116 11/16/19 1141 11/16/19 1238  11/16/19 1730  11/16/19 2005 11/16/19 2148 11/17/19 0010 11/17/19 0500 11/17/19 0534 11/17/19 0541  HGB 13.1  --  12.4*   < >  --    < >  --  13.3 13.3 12.2* 12.9*  --   HCT 42.5  --  34.0*   < >  --    < >  --  39.0 39.0 37.0* 38.0*  --   PLT 170  --  178  --   --   --   --   --   --  125*  --   --   APTT  --   --  37*  --   --   --  51*  --   --   --   --   --   LABPROT  --   --  17.5*  --   --   --  19.2*  --   --   --   --   --   INR  --   --  1.5*  --   --   --  1.6*  --   --   --   --   --   HEPARINUNFRC  --   --   --   --   --   --   --   --   --   --   --  0.12*  CREATININE 7.01*  --   --   --   --    < >  --  8.10* 8.80* 8.39*  --   --   TROPONINIHS  --  171* 243*  --  679*  --   --   --   --   --   --   --    < > = values in this interval not displayed.    Estimated Creatinine Clearance: 10.9 mL/min (A) (by C-G formula based on SCr of 8.39 mg/dL (H)).  Assessment: CC/HPI: 53 yo m presenting post-cardiac arrest from HD - ROSC after 15 min; emergency cric done in field   PMH: NSVT CHF PSA ESRD  Anticoag: none pta - new onset afib on EKG; CTA highly suspicious for PE   CV: witnessed arrest TTM 12/2; new onset afib  Neuro: CTH neg  Renal: ESRD  Heme/Onc: H&H 13.3/39, PLT 178  12/3 AM update:  Heparin level low Pt still at 33 degree cooling No issues per RN  Goal of Therapy:  Heparin level 0.3-0.7 units/ml Monitor platelets by anticoagulation protocol: Yes   Plan:   Heparin re-bolus 1500 units x 1  Heparin infusion to 750 units/hr Re-check heparin level at [place], PharmD, [place] Pharmacist Phone: [phone]

## 2019-11-17 NOTE — Procedures (Addendum)
Patient Name: Brandom Kerwin  MRN: [id number]  Epilepsy Attending: Lora Havens  Referring Physician/Provider: Merlene Laughter, NP Duration:  11/16/2019 2027 to 11/17/2019 2024  Patient history: 53 year old male status post cardiac arrest on TTM.  EEG to evaluate for seizures.  Level of alertness: Comatose/sedated  AEDs during EEG study: Versed  Technical aspects: This EEG study was done with scalp electrodes positioned according to the 10-20 International system of electrode placement. Electrical activity was acquired at a sampling rate of 500Hz  and reviewed with a high frequency filter of 70Hz  and a low frequency filter of 1Hz . EEG data were recorded continuously and digitally stored.   Description: EEG showed continuous generalized mixed 8 to 13 Hz alpha-beta frequency as well as generalized 5 to 6 Hz theta frequency.  Hyperventilation and photic stimulation were not performed.   Abnormality -Continuous slow, generalized  IMPRESSION: This study is suggestive of profound diffuse encephalopathy, non specific to etiology. No seizures or epileptiform discharges were seen throughout the recording.  Lashanti Chambless Barbra Sarks

## 2019-11-17 NOTE — Progress Notes (Signed)
LTM maint complete - no skin breakdown under: FP1, F7, Grnd, P3

## 2019-11-17 NOTE — Plan of Care (Signed)
  Problem: Cardiac: Goal: Ability to achieve and maintain adequate cardiopulmonary perfusion will improve Outcome: Progressing   Problem: Skin Integrity: Goal: Risk for impaired skin integrity will be minimized. Outcome: Progressing   Problem: Pain Managment: Goal: General experience of comfort will improve Outcome: Progressing   Problem: Skin Integrity: Goal: Risk for impaired skin integrity will decrease Outcome: Progressing

## 2019-11-17 NOTE — Progress Notes (Signed)
Chaplain referred by nurse to see Mr. Fallin's father at bedside.  Chaplain was able to learn about Mr. Parker love of sports and the divide that existed in faith traditions within their household.  Mr. Derwin's father spoke of his wife's faith tradition of being a Restaurant manager, fast food and how Basilio decided to stop going to church because of wanting to please his dad's baptist tradition and also wanting to please his mom.  The discussion about the divide about faith in the household seemed to be weighing on Mr. Stowers, possibly as he prepares to make hard decisions about his son's life and care.    Chaplain offered father and Mr. Feggins prayer, father accepted.  Chaplain wanted to get dad talking because he was noticeably carrying a lot on his plate.  Father was able to open up and share some of things he has been feeling and the hard position he is in right now.   Chaplain will continue to follow-up.

## 2019-11-17 NOTE — Progress Notes (Signed)
[facility] Progress Note Patient Name: [name] [name] DOB: February 07, 1966 MRN: [id number]   Date of Service  11/17/2019  HPI/Events of Note  Noted bright red blood while suctioning patients cric  eICU Interventions  eLink RN to notify [facility] physician. Awaiting orders. Will continue to monitor.         Terie Purser 11/17/2019, 7:02 PM

## 2019-11-17 NOTE — Progress Notes (Signed)
Progress Note  Patient Name: [name] [name] Date of Encounter: 11/17/2019  Primary Cardiologist: Fransico Him, MD   Subjective   Patient intubated, on cooling protocol. nonresponsive  Inpatient Medications    Scheduled Meds:  amiodarone  150 mg Intravenous Once   artificial tears  1 application Both Eyes J2I   chlorhexidine gluconate (MEDLINE KIT)  15 mL Mouth Rinse BID   Chlorhexidine Gluconate Cloth  6 each Topical Daily   fentaNYL (SUBLIMAZE) injection  100 mcg Intravenous Once   mouth rinse  15 mL Mouth Rinse 10 times per day   midazolam  2 mg Intravenous Once   pantoprazole (PROTONIX) IV  40 mg Intravenous QHS   Continuous Infusions:  sodium chloride 50 mL/hr at 11/17/19 0600   amiodarone 30 mg/hr (11/17/19 0653)   cisatracurium (NIMBEX) infusion 1 mcg/kg/min (11/17/19 0600)   fentaNYL infusion INTRAVENOUS 100 mcg/hr (11/16/19 1340)   heparin 750 Units/hr (11/17/19 0651)   midazolam 4 mg/hr (11/17/19 0600)   norepinephrine (LEVOPHED) Adult infusion 3 mcg/min (11/17/19 0600)   PRN Meds: [COMPLETED] cisatracurium **AND** cisatracurium (NIMBEX) infusion **AND** cisatracurium, fentaNYL, midazolam   Vital Signs    Vitals:   11/17/19 0400 11/17/19 0500 11/17/19 0600 11/17/19 0700  BP: 93/76 90/60 110/79   Pulse:      Resp: '14 14 14 14  '$ Temp: (!) 91.4 F (33 C) (!) 91.9 F (33.3 C)    TempSrc: Core (Comment)     SpO2:      Weight:      Height:        Intake/Output Summary (Last 24 hours) at 11/17/2019 0737 Last data filed at 11/17/2019 0600 Gross per 24 hour  Intake 1235.76 ml  Output 0 ml  Net 1235.76 ml   Last 3 Weights 11/16/2019 08/02/2019 08/01/2019  Weight (lbs) 176 lb 5.9 oz 166 lb 166 lb  Weight (kg) 80 kg 75.297 kg 75.297 kg      Telemetry    Atrial fibrillation, frequent PVCs - Personally Reviewed  ECG    AFib controlled rate. Poor R wave progression. Prolonged QT- Personally Reviewed  Physical Exam   GEN: on vent via  cricothyrotomy.    Neck: No JVD Cardiac: IRRR, no murmurs, rubs, or gallops.  Respiratory: Clear to auscultation anteriorly GI: Soft, nontender, non-distended  MS: No edema; No deformity. Neuro:  Nonresponsive  Labs    High Sensitivity Troponin:   Recent Labs  Lab 11/16/19 1141 11/16/19 1238 11/16/19 1730  TROPONINIHS 171* 243* 679*      Chemistry Recent Labs  Lab 11/16/19 1116  11/16/19 2148 11/17/19 0010 11/17/19 0500 11/17/19 0534  NA 140   < > 138 138 136 137  K 3.4*   < > 3.6 3.6 4.0 3.8  CL 100   < > 99 102 100  --   CO2 23  --   --   --  21*  --   GLUCOSE 117*   < > 143* 127* 110*  --   BUN 23*   < > 32* 32* 37*  --   CREATININE 7.01*   < > 8.10* 8.80* 8.39*  --   CALCIUM 9.6  --   --   --  8.4*  --   PROT 7.7  --   --   --   --   --   ALBUMIN 3.5  --   --   --   --   --   AST 30  --   --   --   --   --  ALT 17  --   --   --   --   --   ALKPHOS 98  --   --   --   --   --   BILITOT 1.2  --   --   --   --   --   GFRNONAA 8*  --   --   --  7*  --   GFRAA 10*  --   --   --  8*  --   ANIONGAP 17*  --   --   --  15  --    < > = values in this interval not displayed.     Hematology Recent Labs  Lab 11/16/19 1116 11/16/19 1238  11/17/19 0010 11/17/19 0500 11/17/19 0534  WBC 8.8 10.0  --   --  11.1*  --   RBC 4.24 3.45*  --   --  3.98*  --   HGB 13.1 12.4*   < > 13.3 12.2* 12.9*  HCT 42.5 34.0*   < > 39.0 37.0* 38.0*  MCV 100.2* 98.6  --   --  93.0  --   MCH 30.9 35.9*  --   --  30.7  --   MCHC 30.8 36.5*  --   --  33.0  --   RDW 16.1* 16.1*  --   --  15.7*  --   PLT 170 178  --   --  125*  --    < > = values in this interval not displayed.    BNP Recent Labs  Lab 11/16/19 1356  BNP 962.8*     DDimer No results for input(s): DDIMER in the last 168 hours.   Radiology    Ct Head Wo Contrast  Result Date: 11/16/2019 CLINICAL DATA:  Status post cardiac arrest and CPR. EXAM: CT HEAD WITHOUT CONTRAST TECHNIQUE: Contiguous axial images were  obtained from the base of the skull through the vertex without intravenous contrast. COMPARISON:  Head CT 07/15/2012 FINDINGS: Brain: Streak artifact from overlying monitoring devices particularly in the left frontal region. No intracranial hemorrhage, mass effect, or midline shift. No evidence of cerebral edema. Gray-white differentiation is preserved. No hydrocephalus. The basilar cisterns are patent. No evidence of territorial infarct or acute ischemia. No extra-axial or intracranial fluid collection. Vascular: No hyperdense vessel. Skull: No fracture or focal lesion. Sinuses/Orbits: Fluid within the nasopharynx. Fluid level in the right maxillary sinus, sphenoid sinuses, and throughout the ethmoid air cells. No acute orbital abnormality. Minimal opacification of lower mastoid air cells. Other: None. IMPRESSION: 1. No acute intracranial abnormality, allowing for mild streak artifact limitations. 2. Inflammatory changes in the paranasal sinuses, likely due to tracheostomy tube placement. Electronically Signed   By: Keith Rake M.D.   On: 11/16/2019 15:33   Ct Angio Chest Pe W Or Wo Contrast  Result Date: 11/16/2019 CLINICAL DATA:  Cardiac arrest during dialysis today. EXAM: CT ANGIOGRAPHY CHEST WITH CONTRAST TECHNIQUE: Multidetector CT imaging of the chest was performed using the standard protocol during bolus administration of intravenous contrast. Multiplanar CT image reconstructions and MIPs were obtained to evaluate the vascular anatomy. CONTRAST:  175m OMNIPAQUE IOHEXOL 350 MG/ML SOLN COMPARISON:  05/19/2017. FINDINGS: Cardiovascular: Image quality is degraded by respiratory motion, expiratory phase imaging and streak artifact from the patient's arms. These factors limit evaluation of the segmental and subsegmental pulmonary arteries. There are suspected filling defects, however, at the segmental level in the right middle lobe (7/220) and left lower lobe (7/216). No  large central or lobar pulmonary  embolus. Atherosclerotic calcification of the aorta and coronary arteries. Pulmonic trunk and heart are enlarged. No pericardial effusion. Mediastinum/Nodes: Air is seen in the superior mediastinum. Mediastinal lymph nodes measure up to 1.5 cm in the prevascular space, as before. No definite hilar or axillary adenopathy. Esophagus is grossly unremarkable. Lungs/Pleura: Tracheostomy is in place. Large right pleural effusion with collapse/consolidation in the adjacent right upper and right lower lobes. There is airspace consolidation in the left lower lobe with a rounded area of fluid density in the base of the left hemithorax, adjacent to the left hemidiaphragm, measuring 4.2 cm (5/107), new from 05/18/2017. Increased attenuation throughout the lungs is likely due to expiratory phase imaging. No pneumothorax. Trace left pleural fluid. Airway is otherwise unremarkable. Upper Abdomen: Reflux of contrast into the IVC and hepatic veins. Again, streak artifact from the patient's arms degrades image quality. Visualized portions of the liver, adrenal glands, spleen, pancreas and stomach are grossly unremarkable. Perihepatic and perisplenic ascites. Musculoskeletal: Acute anterior rib fractures bilaterally, related to cardiopulmonary resuscitation. Subcutaneous emphysema is seen in the left neck and upper left chest. Old posterior right rib fractures. Bones appear dense, indicative of renal osteodystrophy. Review of the MIP images confirms the above findings. IMPRESSION: 1. Assessment for pulmonary emboli is fairly compromised by respiratory motion, expiratory phase imaging and streak artifact from the patient's arms. There are segmental low-attenuation filling defects associated with the right middle and left lower lobe pulmonary arteries, however, suspicious for pulmonary emboli. No large central or lobar pulmonary embolus. 2. Pneumomediastinum and subcutaneous emphysema with bilateral anterior rib fractures related to  cardiopulmonary resuscitation. No pneumothorax. 3. Large right pleural effusion. New loculated collection of fluid at the base of the left hemithorax may be within the left pleural space. A new small diaphragmatic defect on the left cannot be excluded, given aggressive cardiopulmonary resuscitation. 4. Collapse/consolidation in the right middle and right lower lobes. Additional airspace consolidation in the left lower lobe. Findings can be seen with aspiration. 5. Mediastinal adenopathy may be reactive. Difficult to exclude a lymphoproliferative disorder. 6. Ascites. 7. Aortic atherosclerosis (ICD10-170.0). Coronary artery calcification. 8. Enlarged pulmonic trunk, indicative of pulmonary arterial hypertension. Electronically Signed   By: Lorin Picket M.D.   On: 11/16/2019 16:08   Dg Chest Port 1 View  Result Date: 11/16/2019 CLINICAL DATA:  Central line insertion. Pulmonary infiltrates. EXAM: PORTABLE CHEST 1 VIEW 6:28 p.m. COMPARISON:  Chest x-ray 11/16/2019 at 10:57 a.m. FINDINGS: Tracheostomy tube in good position. New right central venous catheter tip is in the superior vena cava approximately 3 cm below the carina in good position. The bilateral pulmonary infiltrates have progressed particularly in the right upper and mid lung zone. Cardiomegaly. No acute bone abnormality. IMPRESSION: 1. New right central venous catheter is in good position. 2. Progressive bilateral pulmonary infiltrates, particularly in the right upper and mid lung zone. 3. No pneumothorax. Electronically Signed   By: Lorriane Shire M.D.   On: 11/16/2019 18:40   Dg Chest Portable 1 View  Result Date: 11/16/2019 CLINICAL DATA:  Post CPR.  Tracheostomy tube. EXAM: PORTABLE CHEST 1 VIEW COMPARISON:  Radiographs 10/26/2018 and 12/31/2017. FINDINGS: 1057 hours. Tip of the tracheostomy tube is just below the thoracic inlet. The heart is enlarged. There is vascular congestion with increasing right greater than left perihilar opacities,  probably reflecting edema. There is no confluent airspace opacity, pleural effusion or pneumothorax. No acute fractures are seen. IMPRESSION: 1. Worsened right greater than left perihilar  opacities most consistent with edema (versus atypical inflammation). 2. Tip of the tracheostomy tube in the proximal trachea, just below the thoracic inlet. Electronically Signed   By: Richardean Sale M.D.   On: 11/16/2019 11:16    Cardiac Studies   Echo 11/11/19:Study Conclusions  - Left ventricle: The cavity size was mildly dilated. Systolic   function was severely reduced. The estimated ejection fraction   was in the range of 25% to 30%. Diffuse hypokinesis. Although no   diagnostic regional wall motion abnormality was identified, this   possibility cannot be completely excluded on the basis of this   study. Features are consistent with a pseudonormal left   ventricular filling pattern, with concomitant abnormal relaxation   and increased filling pressure (grade 2 diastolic dysfunction). - Aortic valve: Transvalvular velocity was within the normal range.   There was no stenosis. There was no regurgitation. - Mitral valve: Transvalvular velocity was within the normal range.   There was no evidence for stenosis. There was trivial   regurgitation. - Left atrium: The atrium was severely dilated. - Right ventricle: The cavity size was moderately dilated. Wall   thickness was normal. Systolic function was moderately reduced.   RV systolic pressure (S, est): 47 mm Hg. - Right atrium: The atrium was moderately dilated. - Tricuspid valve: There was mild-moderate regurgitation. - Pulmonic valve: There was trivial regurgitation. - Pulmonary arteries: The main pulmonary artery was mildly dilated.   Systolic pressure was moderately increased. PA peak pressure: 47   mm Hg (S). - Inferior vena cava: The vessel was dilated. The respirophasic   diameter changes were blunted (< 50%), consistent with elevated    central venous pressure. - Pericardium, extracardiac: There was no pericardial effusion.    Patient Profile     53 y.o. 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 Lynetta Mare.  Assessment & Plan    1. PEA arrest: occurred at HD today with immediate bystander CPR with ROSC achieved after 15 minutes with 1 round of epi, Ca, and bicarb). Etiology unclear ? PE, aspiration, metabolic, acute volume shift in setting of poor LV function. No clear arrhythmia. Doubt ischemic with no acute Ecg changes and no CAD by cath in 2017. - Continue cooling protocol per primary team - continue vent support - No plans for ischemic evaluation at this time - on IV heparin for possible PE and for AFib - bedside Echo yesterday showed very poor LV and RV function. Will await complete Echo today. -Patient is a poor candidate for advanced CHF therapies. I don't think he is a candidate for ICD given poor social situation/ substance abuse and poor long term prognosis.  2. Acute respiratory failure: he underwent emergent cricothyrotomy by EMS prior to arrival. CT Chest with RML and LLL PE, large right pleural effusion with new loculated collection of fluid at the base the left hemithorax, and collapse/consolidation in the RML and RLL possibly 2/2 aspiration. Currently ventilated  - Continue management per primary team  3. New onset atrial fibrillation: noted post resuscitation. Rates well controlled. LHC in 2017 without CAD.  - This patients CHA2DS2-VASc Score and unadjusted Ischemic Stroke Rate (% per year) is equal to 3.2 % stroke rate/year from a score of 3 Above score calculated as 1 point each if present [  CHF, HTN, DM, Vascular=MI/PAD/Aortic Plaque, Age if 65-74, or Male] Above score calculated as 2 points each if present  [Age > 75, or Stroke/TIA/TE] - on IV heparin - on IV amiodarone but need to monitor closely for bradycardia on cooling protocol.   4. Chronic combined CHF: EF 25-30% with G2DD on echo 10/2018. Not a candidate for ICD given non-compliance and drug abuse. Not on goal directed therapy due to underlying renal disease. Volume managed with HD.  - Continue volume management per nephrology - now on phenylephrine for pressure support.  - otherwise as noted in #1  5. HTN: hypotensive on pressor support  6. ESRD on HD: on dialysis M/W/F.  - Continue management per nephrology  7. Prolonged QT - was normal on admit. Likely related to cooling protocol. Need to monitor for arrhythmia on amiodarone.         For questions or updates, please contact [place] Please consult [email or web address] for contact info under        Signed, Diyari Cherne Martinique, MD  11/17/2019, 7:37 AM

## 2019-11-18 ENCOUNTER — Inpatient Hospital Stay (HOSPITAL_COMMUNITY): Payer: Medicare Other

## 2019-11-18 DIAGNOSIS — Z992 Dependence on renal dialysis: Secondary | ICD-10-CM

## 2019-11-18 DIAGNOSIS — I5023 Acute on chronic systolic (congestive) heart failure: Secondary | ICD-10-CM | POA: Diagnosis not present

## 2019-11-18 DIAGNOSIS — I469 Cardiac arrest, cause unspecified: Secondary | ICD-10-CM | POA: Diagnosis not present

## 2019-11-18 DIAGNOSIS — N186 End stage renal disease: Secondary | ICD-10-CM

## 2019-11-18 DIAGNOSIS — I480 Paroxysmal atrial fibrillation: Secondary | ICD-10-CM | POA: Diagnosis not present

## 2019-11-18 DIAGNOSIS — J9600 Acute respiratory failure, unspecified whether with hypoxia or hypercapnia: Secondary | ICD-10-CM | POA: Diagnosis not present

## 2019-11-18 DIAGNOSIS — I428 Other cardiomyopathies: Secondary | ICD-10-CM | POA: Diagnosis not present

## 2019-11-18 LAB — BASIC METABOLIC PANEL
Anion gap: 15 (ref 5–15)
BUN: 42 mg/dL — ABNORMAL HIGH (ref 6–20)
CO2: 22 mmol/L (ref 22–32)
Calcium: 8.1 mg/dL — ABNORMAL LOW (ref 8.9–10.3)
Chloride: 101 mmol/L (ref 98–111)
Creatinine, Ser: 8.77 mg/dL — ABNORMAL HIGH (ref 0.61–1.24)
GFR calc Af Amer: 7 mL/min — ABNORMAL LOW (ref >60)
GFR calc non Af Amer: 6 mL/min — ABNORMAL LOW (ref >60)
Glucose, Bld: 83 mg/dL (ref 70–99)
Potassium: 4.1 mmol/L (ref 3.5–5.1)
Sodium: 138 mmol/L (ref 135–145)

## 2019-11-18 LAB — GLUCOSE, CAPILLARY
Glucose-Capillary: 85 mg/dL (ref 70–99)
Glucose-Capillary: 60 mg/dL — ABNORMAL LOW (ref 70–99)
Glucose-Capillary: 95 mg/dL (ref 70–99)
Glucose-Capillary: 65 mg/dL — ABNORMAL LOW (ref 70–99)
Glucose-Capillary: 66 mg/dL — ABNORMAL LOW (ref 70–99)
Glucose-Capillary: 66 mg/dL — ABNORMAL LOW (ref 70–99)
Glucose-Capillary: 110 mg/dL — ABNORMAL HIGH (ref 70–99)
Glucose-Capillary: 87 mg/dL (ref 70–99)
Glucose-Capillary: 73 mg/dL (ref 70–99)
Glucose-Capillary: 76 mg/dL (ref 70–99)
Glucose-Capillary: 44 mg/dL — CL (ref 70–99)

## 2019-11-18 LAB — POCT I-STAT, CHEM 8
BUN: 35 mg/dL — ABNORMAL HIGH (ref 6–20)
BUN: 36 mg/dL — ABNORMAL HIGH (ref 6–20)
BUN: 36 mg/dL — ABNORMAL HIGH (ref 6–20)
BUN: 37 mg/dL — ABNORMAL HIGH (ref 6–20)
BUN: 38 mg/dL — ABNORMAL HIGH (ref 6–20)
Calcium, Ion: 1.05 mmol/L — ABNORMAL LOW (ref 1.15–1.40)
Calcium, Ion: 1.05 mmol/L — ABNORMAL LOW (ref 1.15–1.40)
Calcium, Ion: 1.06 mmol/L — ABNORMAL LOW (ref 1.15–1.40)
Calcium, Ion: 1.08 mmol/L — ABNORMAL LOW (ref 1.15–1.40)
Calcium, Ion: 1.11 mmol/L — ABNORMAL LOW (ref 1.15–1.40)
Chloride: 102 mmol/L (ref 98–111)
Chloride: 102 mmol/L (ref 98–111)
Chloride: 102 mmol/L (ref 98–111)
Chloride: 103 mmol/L (ref 98–111)
Chloride: 104 mmol/L (ref 98–111)
Creatinine, Ser: 8.6 mg/dL — ABNORMAL HIGH (ref 0.61–1.24)
Creatinine, Ser: 8.6 mg/dL — ABNORMAL HIGH (ref 0.61–1.24)
Creatinine, Ser: 8.6 mg/dL — ABNORMAL HIGH (ref 0.61–1.24)
Creatinine, Ser: 8.7 mg/dL — ABNORMAL HIGH (ref 0.61–1.24)
Creatinine, Ser: 9 mg/dL — ABNORMAL HIGH (ref 0.61–1.24)
Glucose, Bld: 79 mg/dL (ref 70–99)
Glucose, Bld: 84 mg/dL (ref 70–99)
Glucose, Bld: 86 mg/dL (ref 70–99)
Glucose, Bld: 92 mg/dL (ref 70–99)
Glucose, Bld: 94 mg/dL (ref 70–99)
HCT: 37 % — ABNORMAL LOW (ref 39.0–52.0)
HCT: 37 % — ABNORMAL LOW (ref 39.0–52.0)
HCT: 38 % — ABNORMAL LOW (ref 39.0–52.0)
HCT: 38 % — ABNORMAL LOW (ref 39.0–52.0)
HCT: 39 % (ref 39.0–52.0)
Hemoglobin: 12.6 g/dL — ABNORMAL LOW (ref 13.0–17.0)
Hemoglobin: 12.6 g/dL — ABNORMAL LOW (ref 13.0–17.0)
Hemoglobin: 12.9 g/dL — ABNORMAL LOW (ref 13.0–17.0)
Hemoglobin: 12.9 g/dL — ABNORMAL LOW (ref 13.0–17.0)
Hemoglobin: 13.3 g/dL (ref 13.0–17.0)
Potassium: 3.8 mmol/L (ref 3.5–5.1)
Potassium: 3.9 mmol/L (ref 3.5–5.1)
Potassium: 3.9 mmol/L (ref 3.5–5.1)
Potassium: 3.9 mmol/L (ref 3.5–5.1)
Potassium: 3.9 mmol/L (ref 3.5–5.1)
Sodium: 138 mmol/L (ref 135–145)
Sodium: 138 mmol/L (ref 135–145)
Sodium: 138 mmol/L (ref 135–145)
Sodium: 138 mmol/L (ref 135–145)
Sodium: 139 mmol/L (ref 135–145)
TCO2: 21 mmol/L — ABNORMAL LOW (ref 22–32)
TCO2: 22 mmol/L (ref 22–32)
TCO2: 22 mmol/L (ref 22–32)
TCO2: 22 mmol/L (ref 22–32)
TCO2: 24 mmol/L (ref 22–32)

## 2019-11-18 LAB — PHOSPHORUS
Phosphorus: 5.9 mg/dL — ABNORMAL HIGH (ref 2.5–4.6)
Phosphorus: 5.8 mg/dL — ABNORMAL HIGH (ref 2.5–4.6)
Phosphorus: 5.6 mg/dL — ABNORMAL HIGH (ref 2.5–4.6)

## 2019-11-18 LAB — RENAL FUNCTION PANEL
Albumin: 3.1 g/dL — ABNORMAL LOW (ref 3.5–5.0)
Anion gap: 12 (ref 5–15)
BUN: 38 mg/dL — ABNORMAL HIGH (ref 6–20)
CO2: 22 mmol/L (ref 22–32)
Calcium: 8 mg/dL — ABNORMAL LOW (ref 8.9–10.3)
Chloride: 102 mmol/L (ref 98–111)
Creatinine, Ser: 7.01 mg/dL — ABNORMAL HIGH (ref 0.61–1.24)
GFR calc Af Amer: 10 mL/min — ABNORMAL LOW (ref >60)
GFR calc non Af Amer: 8 mL/min — ABNORMAL LOW (ref >60)
Glucose, Bld: 79 mg/dL (ref 70–99)
Phosphorus: 5.1 mg/dL — ABNORMAL HIGH (ref 2.5–4.6)
Potassium: 4.9 mmol/L (ref 3.5–5.1)
Sodium: 136 mmol/L (ref 135–145)

## 2019-11-18 LAB — MAGNESIUM
Magnesium: 1.8 mg/dL (ref 1.7–2.4)
Magnesium: 1.9 mg/dL (ref 1.7–2.4)
Magnesium: 2 mg/dL (ref 1.7–2.4)

## 2019-11-18 LAB — PROCALCITONIN: Procalcitonin: 31.62 ng/mL

## 2019-11-18 LAB — HEPARIN LEVEL (UNFRACTIONATED)
Heparin Unfractionated: 0.18 IU/mL — ABNORMAL LOW (ref 0.30–0.70)
Heparin Unfractionated: 0.34 IU/mL (ref 0.30–0.70)

## 2019-11-18 MED ORDER — DEXTROSE 50 % IV SOLN
25.0000 mL | Freq: Once | INTRAVENOUS | Status: AC
  Administered 2019-11-18: 25 mL via INTRAVENOUS

## 2019-11-18 MED ORDER — SODIUM CHLORIDE 0.9 % IV SOLN
3.0000 g | Freq: Three times a day (TID) | INTRAVENOUS | Status: DC
  Administered 2019-11-18 – 2019-11-22 (×12): 3 g via INTRAVENOUS
  Filled 2019-11-18: qty 8
  Filled 2019-11-18 (×3): qty 3
  Filled 2019-11-18: qty 8
  Filled 2019-11-18 (×3): qty 3
  Filled 2019-11-18: qty 8
  Filled 2019-11-18: qty 3
  Filled 2019-11-18: qty 8
  Filled 2019-11-18 (×3): qty 3

## 2019-11-18 MED ORDER — PRISMASOL BGK 4/2.5 32-4-2.5 MEQ/L REPLACEMENT SOLN
Status: DC
  Administered 2019-11-18 – 2019-11-22 (×10): via INTRAVENOUS_CENTRAL

## 2019-11-18 MED ORDER — PRISMASOL BGK 4/2.5 32-4-2.5 MEQ/L REPLACEMENT SOLN
Status: DC
  Administered 2019-11-18 – 2019-11-22 (×10): via INTRAVENOUS_CENTRAL

## 2019-11-18 MED ORDER — B COMPLEX-C PO TABS
1.0000 | ORAL_TABLET | Freq: Every day | ORAL | Status: DC
  Administered 2019-11-18 – 2019-12-08 (×19): 1
  Filled 2019-11-18 (×22): qty 1

## 2019-11-18 MED ORDER — DEXTROSE 5 % IV SOLN
INTRAVENOUS | Status: DC
  Administered 2019-11-18 – 2019-11-22 (×5): via INTRAVENOUS

## 2019-11-18 MED ORDER — DEXTROSE 50 % IV SOLN
25.0000 mL | Freq: Once | INTRAVENOUS | Status: AC
  Administered 2019-11-18: 25 mL via INTRAVENOUS
  Filled 2019-11-18: qty 50

## 2019-11-18 MED ORDER — SODIUM CHLORIDE 0.9 % IV SOLN
1.5000 g | Freq: Two times a day (BID) | INTRAVENOUS | Status: DC
  Filled 2019-11-18: qty 4

## 2019-11-18 MED ORDER — SODIUM CHLORIDE 0.9 % FOR CRRT
INTRAVENOUS_CENTRAL | Status: DC | PRN
  Filled 2019-11-18: qty 1000

## 2019-11-18 MED ORDER — HEPARIN SODIUM (PORCINE) 1000 UNIT/ML DIALYSIS
1000.0000 [IU] | INTRAMUSCULAR | Status: DC | PRN
  Administered 2019-11-18 – 2019-11-22 (×2): 2800 [IU] via INTRAVENOUS_CENTRAL
  Filled 2019-11-18 (×4): qty 6

## 2019-11-18 MED ORDER — VITAL AF 1.2 CAL PO LIQD
1000.0000 mL | ORAL | Status: DC
  Administered 2019-11-19 – 2019-11-20 (×2): 1000 mL

## 2019-11-18 MED ORDER — PRO-STAT SUGAR FREE PO LIQD
30.0000 mL | Freq: Three times a day (TID) | ORAL | Status: DC
  Administered 2019-11-18 – 2019-12-14 (×74): 30 mL
  Filled 2019-11-18 (×70): qty 30

## 2019-11-18 MED ORDER — DEXTROSE 50 % IV SOLN
INTRAVENOUS | Status: AC
  Filled 2019-11-18: qty 50

## 2019-11-18 MED ORDER — PRISMASOL BGK 4/2.5 32-4-2.5 MEQ/L IV SOLN
INTRAVENOUS | Status: DC
  Administered 2019-11-18 – 2019-11-22 (×32): via INTRAVENOUS_CENTRAL

## 2019-11-18 NOTE — Progress Notes (Signed)
NAME:  [name] [name], MRN:  [id number], DOB:  11/01/1966, LOS: 2 ADMISSION DATE:  11/16/2019, CONSULTATION DATE:  12/2 REFERRING MD:  Dr. Sedonia Small, CHIEF COMPLAINT:  Post cardiac arrest    Brief History   53yo male presented post arrest which occurred during iHD treatment. This was a witnessed arrest with quick bystander CPR. Received 3 rounds of epi, 1g of calcium and 50 mEq of bicarb with ROSC after 15 minutes.   History of present illness   HPI obtained from medical chart review as patient is currently intubated on mechanical ventilation.  53 year old male with extensive PMH significant for but not limited to NSVT, combined systolic and diastolic CHF, polysubstance abuse, and  end-stage renal disease who presented to the ED after cardiac arrest.  Patient had presented to outpatient hemodialysis appointment 3-1/2 hours into HD treatment when patient suffered a witnessed cardiac arrest. On arrival to EMS patients rhythm was PEA.  Patient received 3 rounds of epi, 1g of calcium and 50 mEq of bicarb with ROSC after 15 minutes. Emergent cricothyrotomy performed by EMS due to failed intubation with [facility] airway.   Vital signs stable on admission, has remained in sinus rhythm since ROSC.  ABG on admission pH 7.227 / pCO2 65.7 / pO2 349.0 / bicarb 27.3. Troponin 171, creatinine 7.01 and  lactic acid 5.9.   PCCM consulted for admission   Past Medical History  NSVT 2017 Nonischemic cardiomyopathy Medical noncompliance Polysubstance abuse Hypertension End-stage renal disease Combined systolic and diastolic congestive heart failure Cocaine abuse Alcohol abuse Prolonged QTC  Significant Hospital Events   12/2 admitted post cardiac arrest  Consults:  Cardiology Nephrology  Procedures:  12/2 remeasuring cricothyrotomy per EMS  Significant Diagnostic Tests:  CXR 12/2 > Worsened right greater than left perihilar opacities most consistent with edema (versus atypical inflammation). Tip of the  tracheostomy tube in the proximal trachea, just below the thoracic inlet.  Micro Data:  COVID 12/2 > negative  Antimicrobials:    Interim history/subjective:  We will require temporary HD catheter  Objective   Blood pressure 90/62, pulse (!) 108, temperature 98.1 F (36.7 C), temperature source Rectal, resp. rate (!) 7, height 5' 10.5" (1.791 m), weight 80 kg, SpO2 98 %. CVP:  [11 mmHg-20 mmHg] 20 mmHg  Vent Mode: PRVC FiO2 (%):  [40 %] 40 % Set Rate:  [14 bmp] 14 bmp Vt Set:  [590 mL] 590 mL PEEP:  [5 cmH20] 5 cmH20 Plateau Pressure:  [19 cmH20-27 cmH20] 27 cmH20   Intake/Output Summary (Last 24 hours) at 11/18/2019 0959 Last data filed at 11/18/2019 0800 Gross per 24 hour  Intake 1322.48 ml  Output 0 ml  Net 1322.48 ml   Filed Weights   11/16/19 1300  Weight: 80 kg    Examination: General: Obese male who is poorly responsive HEENT: Nipples equal reactive.  Emergency circ in place without overt bleeding Neuro: Does not respond to verbal noxious stimuli CV: Heart sounds are regular underlying atrial failure PULM: Coarse rhonchi with decreased breath sounds in the bases Vent pressure regulated volume control FIO2 40% PEEP 5 RATE 14 VT 590    GI: soft, bsx4 active  GU: Amber urine Extremities: warm/dry, 2+ edema left arm bicep AV graft positive bruit and thrill Skin: no rashes or lesions    Resolved Hospital Problem list     Assessment & Plan:  Cardiac arrest -[place] etiology, pt has hx of combined systolic and diastolic CHF, NSVT, prolonged QT, and polysubstance abuse  Circulatory shock -~  15 minutes downtime, witnessed arrest with quick initiation of bystander CPR P: TTM goals Appreciate cardiology's input Vasopressor support   Acute hypoxic respiratory failure Emergent cricothyrotomy   -In the setting of post cardiac arrest  P: Vent bundle  Wean per protocol when able  Pulmonary toilet Emergency circ in place mobilize likely needs ENT and  revision in the future.    New onset A-fib HX NSVT HX Prolonged QT -EKG on admission with A-fib and prolonged QT P: Off amiodarone due to prolonged QT Continue to monitor  Chronic combined systolic and diastolic congestive heart failure Nonischemic cardiomyopathy  -ECHO 11/10/18 with EF of 25-30% and grade 2 diastolicdysfunction  -Right and left heart cath 02/25/2016 with no angiographic evidence of CAD P: Cardiology is following   At risk for anoxic encephalopathy. -In the setting of cardiac arrest  P: EEG has been evaluated by neurology who with poor prognosis findings consistent with anoxic injury Sedate as needed Monitor for seizure  At risk for multiple metabolic derangements during cooling. Recent Labs  Lab 11/17/19 1219 11/17/19 1813 11/18/19 0306  K 4.3 4.1 4.1   Recent Labs  Lab 11/17/19 1219 11/17/19 1813 11/18/19 0306  NA 137 137 138    P: IV fluids were KVO in setting of end-stage renal disease Monitor and correct electrolyte disturbances  At risk for hyperglycemia during cooling. CBG (last 3)  Recent Labs    11/18/19 0544 11/18/19 0831 11/18/19 0832  GLUCAP 95 39* 65*    P: Continue to monitor using sliding scale insulin protocol  ESRD -Undergoing iHD at time of arrest, assuming regular schedule is MWF P: Nephrology is following Request for temporary hemodialysis catheter on 11/18/2019 will be placed after heparin is off for now  Hypertension -Home medications includes Coreg and Imdur  P: Currently a moot point requiring vasopressor support May need antihypertensives in the future  Polysubstance abuse -Hx of cocaine, alcohol, and tobacco abuse P: PAD protocol  Best practice:  Diet: NPO Pain/Anxiety/Delirium protocol (if indicated): Fentanyl and versed  VAP protocol (if indicated): In place  DVT prophylaxis: Subq heparin GI prophylaxis: PPI Glucose control: SSI Mobility: Bedrest Code Status: Full Family Communication:  Updated  Disposition: ICU  Labs   CBC: Recent Labs  Lab 11/16/19 1116 11/16/19 1238  11/17/19 0010 11/17/19 0500 11/17/19 0534 11/17/19 1219 11/17/19 1813  WBC 8.8 10.0  --   --  11.1*  --   --   --   NEUTROABS 5.6  --   --   --   --   --   --   --   HGB 13.1 12.4*   < > 13.3 12.2* 12.9* 13.6 12.6*  HCT 42.5 34.0*   < > 39.0 37.0* 38.0* 40.0 37.0*  MCV 100.2* 98.6  --   --  93.0  --   --   --   PLT 170 178  --   --  125*  --   --   --    < > = values in this interval not displayed.    Basic Metabolic Panel: Recent Labs  Lab 11/16/19 1116  11/16/19 1730  11/17/19 0010 11/17/19 0500 11/17/19 0534 11/17/19 1219 11/17/19 1813 11/18/19 0306  NA 140   < >  --    < > 138 136 137 137 137 138  K 3.4*   < >  --    < > 3.6 4.0 3.8 4.3 4.1 4.1  CL 100  --   --    < >  102 100  --  101 100 101  CO2 23  --   --   --   --  21*  --   --   --  22  GLUCOSE 117*  --   --    < > 127* 110*  --  97 134* 83  BUN 23*  --   --    < > 32* 37*  --  36* 35* 42*  CREATININE 7.01*  --   --    < > 8.80* 8.39*  --  8.50* 8.70* 8.77*  CALCIUM 9.6  --   --   --   --  8.4*  --   --   --  8.1*  MG  --   --  1.9  --   --  2.0  --   --   --  1.8  PHOS  --   --  5.9*  --   --  5.2*  --   --   --  5.9*   < > = values in this interval not displayed.   GFR: Estimated Creatinine Clearance: 10.5 mL/min (A) (by C-G formula based on SCr of 8.77 mg/dL (H)). Recent Labs  Lab 11/16/19 1101 11/16/19 1116 11/16/19 1238 11/16/19 1346 11/16/19 1357 11/16/19 1740 11/16/19 2005 11/17/19 0500 11/18/19 0306  PROCALCITON  --   --   --   --  0.69  --   --  24.16 31.62  WBC  --  8.8 10.0  --   --   --   --  11.1*  --   LATICACIDVEN 5.9*  --   --  4.4*  --  1.2 1.3  --   --     Liver Function Tests: Recent Labs  Lab 11/16/19 1116  AST 30  ALT 17  ALKPHOS 98  BILITOT 1.2  PROT 7.7  ALBUMIN 3.5   No results for input(s): LIPASE, AMYLASE in the last 168 hours. No results for input(s): AMMONIA in the last  168 hours.  ABG    Component Value Date/Time   PHART 7.474 (H) 11/17/2019 0534   PCO2ART 30.4 (L) 11/17/2019 0534   PO2ART 170.0 (H) 11/17/2019 0534   HCO3 22.4 11/17/2019 0534   TCO2 22 11/17/2019 1813   ACIDBASEDEF 2.0 11/16/2019 1115   O2SAT 100.0 11/17/2019 0534     Coagulation Profile: Recent Labs  Lab 11/16/19 1238 11/16/19 2005  INR 1.5* 1.6*    Cardiac Enzymes: No results for input(s): CKTOTAL, CKMB, CKMBINDEX, TROPONINI in the last 168 hours.  HbA1C: Hgb A1c MFr Bld  Date/Time Value Ref Range Status  03/10/2011 12:02 PM (H) <5.7 % Final   5.7 (NOTE)                                                                       According to the ADA Clinical Practice Recommendations for 2011, when HbA1c is used as a screening test:   >=6.5%   Diagnostic of Diabetes Mellitus           (if abnormal result  is confirmed)  5.7-6.4%   Increased risk of developing Diabetes Mellitus  References:Diagnosis and Classification of Diabetes Mellitus,Diabetes YBOF,7510,25(ENIDP 1):S62-S69 and Standards of Medical Care in  Diabetes - 2011,Diabetes Care,2011,34  (Suppl 1):S11-S61.    CBG: Recent Labs  Lab 11/18/19 0045 11/18/19 0456 11/18/19 0544 11/18/19 0831 11/18/19 0832  GLUCAP 85 60* 95 39* 65*     App cct 40 min   Richardson Landry Lucy Boardman ACNP Acute Care Nurse Practitioner [place] Please consult [place] 11/18/2019, 9:59 AM

## 2019-11-18 NOTE — Progress Notes (Signed)
Hypoglycemic Event  CBG: 66 at 1011  Treatment: 1/2 amp D50 and D5% gtt started at 73ml/hr   Symptoms: pt sedated   Follow-up CBG: Time:  1040 CBG Result: 110  Possible Reasons for Event: lack of nutrition, Cortrak placement planned today   Comments/MD notified: Minor NP notified     Halton Neas P Marjarie Irion

## 2019-11-18 NOTE — Progress Notes (Signed)
ANTICOAGULATION CONSULT NOTE   Pharmacy Consult for Heparin Indication: atrial fibrillation and pulmonary embolus  Patient Measurements: Height: 5' 10.5" (179.1 cm) Weight: 176 lb 5.9 oz (80 kg) IBW/kg (Calculated) : 74.15  Vital Signs: Temp: 97.7 F (36.5 C) (12/04 1700) Temp Source: Rectal (12/04 1600) BP: 90/76 (12/04 1800) Pulse Rate: 118 (12/04 1800)  Labs: Recent Labs    11/16/19 1116 11/16/19 1141 11/16/19 1238  11/16/19 1730  11/16/19 2005  11/17/19 0500 11/17/19 0534  11/17/19 1219  11/17/19 1813 11/17/19 2000 11/18/19 0305 11/18/19 0306 11/18/19 1619  HGB 13.1  --  12.4*   < >  --    < >  --    < > 12.2* 12.9*  --  13.6  --  12.6*  --   --   --   --   HCT 42.5  --  34.0*   < >  --    < >  --    < > 37.0* 38.0*  --  40.0  --  37.0*  --   --   --   --   PLT 170  --  178  --   --   --   --   --  125*  --   --   --   --   --   --   --   --   --   APTT  --   --  37*  --   --   --  51*  --   --   --   --   --   --   --   --   --   --   --   LABPROT  --   --  17.5*  --   --   --  19.2*  --   --   --   --   --   --   --   --   --   --   --   INR  --   --  1.5*  --   --   --  1.6*  --   --   --   --   --   --   --   --   --   --   --   HEPARINUNFRC  --   --   --   --   --   --   --   --   --   --    < >  --    < >  --  0.24* 0.18*  --  0.34  CREATININE 7.01*  --   --   --   --    < >  --    < > 8.39*  --   --  8.50*  --  8.70*  --   --  8.77* 7.01*  TROPONINIHS  --  171* 243*  --  679*  --   --   --   --   --   --   --   --   --   --   --   --   --    < > = values in this interval not displayed.    Estimated Creatinine Clearance: 13.1 mL/min (A) (by C-G formula based on SCr of 7.01 mg/dL (H)).  Assessment: 53 yo male with new onset afib and possible PE for heparin  Heparin infusion at 950 units/hr was stopped earlier today for placement  of catheter for CRRT and was restarted at same rate at 12:37 PM today. Heparin level drawn at 16:19 PM (~3.5 hrs after infusion  restarted) was 0.34 units/ml, which is within goal range. H/H, platelets stable. Per RN, pt continues to have episodes of epistaxis, but seems to be getting better.  Goal of Therapy:  Heparin level 0.3-0.7 units/ml (targeting lower end of range, due to episodic epistaxis) Monitor platelets by anticoagulation protocol: Yes   Plan: Continue heparin at 950 units/hr Check 8-hr heparin level Monitor daily heparin level, CBC Monitor for signs/symptoms of bleedingh  Gillermina Hu, PharmD, BCPS, [facility] Clinical Pharmacist 11/18/2019 6:55 PM ,

## 2019-11-18 NOTE — Progress Notes (Signed)
LTM maintenance completed; checked A1 and A2, no skin breakdown was seen.

## 2019-11-18 NOTE — Procedures (Signed)
Hemodialysis Insertion Procedure Note [name] [name] [id number] 17-Jun-1966  Procedure: Insertion of Hemodialysis Catheter Type: 3 port  Indications: Hemodialysis   Procedure Details Consent: Risks of procedure as well as the alternatives and risks of each were explained to the (patient/caregiver).  Consent for procedure obtained. Time Out: Verified patient identification, verified procedure, site/side was marked, verified correct patient position, special equipment/implants available, medications/allergies/relevent history reviewed, required imaging and test results available.  Performed  Maximum sterile technique was used including antiseptics, cap, gloves, gown, hand hygiene, mask and sheet. Skin prep: Chlorhexidine; local anesthetic administered A antimicrobial bonded/coated triple lumen catheter was placed in the right femoral vein due to patient being a dialysis patient using the Seldinger technique. Ultrasound guidance used.Yes.   Catheter placed to 20 cm. Blood aspirated via all 3 ports and then flushed x 3. Line sutured x 2 and dressing applied.   Rt fem a/v Evaluation Blood flow good Complications: No apparent complications Patient did tolerate procedure well. Chest X-ray ordered to verify placement.  CXR: not needed  Richardson Landry Berry Godsey ACNP Acute Care Nurse Practitioner [place] Please consult [place] 11/18/2019, 11:34 AM

## 2019-11-18 NOTE — Progress Notes (Signed)
Hypoglycemic Event  CBG: 66  Treatment: 25mg  D50   Symptoms:  UTA  Follow-up CBG: Time: 1233 CBG Result: 87  Possible Reasons for Event:  Lack of nutrition   Comments/MD notified: MD made aware     [name] [name]

## 2019-11-18 NOTE — Procedures (Addendum)
Patient Name:[name] [name] JAS:[id number] Epilepsy Attending:Latrelle Fuston Barbra Sarks Referring Physician/Provider:Whitney Rosana Hoes, NP Duration: 11/17/2019 2024 to 11/17/2019 2024  Patient history:53 year old male status post cardiac arrest on TTM. EEG to evaluate for seizures.  Level of alertness:Comatose/sedated  AEDs during EEG study:Versed  Technical aspects: This EEG study was done with scalp electrodes positioned according to the 10-20 International system of electrode placement. Electrical activity was acquired at a sampling rate of 500Hz  and reviewed with a high frequency filter of 70Hz  and a low frequency filter of 1Hz . EEG data were recorded continuously and digitally stored.  Description:EEG showed continuous generalized mixed 8 to 13 Hz alpha-beta frequency as well as generalized 5 to 6 Hz theta frequency. EEG was reactive to tactile stimulation. Hyperventilation and photic stimulation were not performed.   Abnormality - Intermittent slow, generalized - Generalized alpha activity  IMPRESSION: This study issuggestive of profound diffuse encephalopathy, non specific to etiology.No seizures or epileptiform discharges were seen throughout the recording.  EEG appears similar to prior study.      [name] Barbra Sarks

## 2019-11-18 NOTE — Consult Note (Signed)
Reason for Consult: Management of airway Referring Physician: Kipp Brood, MD  [name] [name] is an 53 y.o. male.  HPI: Patient admitted yesterday after a cardiac arrest and emergent cricothyrotomy in the field.  He is stable on ventilatory support and the plan for now is to evaluate his neurologic situation.  There may be a need for conversion to a tracheostomy.  Past Medical History:  Diagnosis Date  . Abdominal distension 08/10/2018  . Alcohol abuse 11/26/2010   Qualifier: Diagnosis of  By: [name] MD, Pershing Proud  . Anemia due to blood loss, acute 07/15/2012  . CHF (congestive heart failure) ([place])   . Cholecystitis 04/18/2014  . Cigarette smoker    has currently quit  . Cocaine abuse ([place]) 11/26/2010   Qualifier: Diagnosis of  By: [name] MD, Benjamine Mola  has been over a year  . 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 Echo 8/13: Severe LVH, EF 40-45%, inferoposterior akinesis, grade 2 diastolic dysfunction, moderate LAE, mild RVE, mildly reduced RVSF, mild RAE; cannot rule out R atrial mass-suggest TEE or cardiac MRI  //  B. Echo 3/17: Mild LVH, EF 25-30%, diffuse HK, grade 2 diastolic dysfunction, mild MR, severe LAE,  moderately reduced RVSF, severe RAE, mild TR, moderate PI, PASP 55 mmHg    . Dyspnea   . ESRD (end stage renal disease) on dialysis [facility])    MWF- [place] (05/18/2017)  . Hemodialysis patient ([place])    M,W,F  . Hernia, inguinal, right 08/05/2016  . Hypertension   . 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 Hypocalcemia 07/15/2012  . Medical non-compliance   . Non-ischemic cardiomyopathy ([place])    A. R/L HC 3/17: Normal coronary arteries, moderate pulmonary hypertension (PASP 65 mmHg), elevated LV filling pressures (LVEDP 45 mmHg)   . NSVT (nonsustained ventricular tachycardia) ([place]) 02/22/2016  . NSVT  (nonsustained ventricular tachycardia) ([place]) 02/22/2016  . Pneumonia 11/12/2017  . Polysubstance abuse ([place])   . Prolonged Q-T interval on ECG 05/18/2017  . Prolonged QT interval 05/18/2017  . Restless leg syndrome   . Solitary pulmonary nodule 10/10/2015   See cxr 10/09/2015 - CT rec 10/10/2015 >>>   . Upper airway cough syndrome 10/09/2015   Off ACEi around 1st Oct 2016  - Sinus CT 10/10/2015 >>>      Past Surgical History:  Procedure Laterality Date  . [place] TRANSPOSITION  01/19/2013   Procedure: BASCILIC VEIN TRANSPOSITION;  Surgeon: Conrad Onycha, MD;  Location: [place];  Service: Vascular;  Laterality: Left;  left 2nd stage basilic vein transposition  . CARDIAC CATHETERIZATION N/A 02/25/2016   Procedure: Right/Left Heart Cath and Coronary Angiography;  Surgeon: Burnell Blanks, MD;  Location: [facility];  Service: Cardiovascular;  Laterality: N/A;  . INGUINAL HERNIA REPAIR Right 08/05/2016   Procedure: RIGHT INGUINAL HERNIA REPAIR WITH MESH;  Surgeon: Donnie Mesa, MD;  Location: [place];  Service: General;  Laterality: Right;  . INSERTION OF DIALYSIS CATHETER  07/17/2012   Procedure: INSERTION OF DIALYSIS CATHETER;  Surgeon: Conrad [place], MD;  Location: [place];  Service: Vascular;  Laterality: Right;  right internal jugular  . INSERTION OF MESH Right 08/05/2016   Procedure: INSERTION OF MESH;  Surgeon: Donnie Mesa, MD;  Location: [place];  Service: General;  Laterality: Right;  . REVISION OF ARTERIOVENOUS GORETEX GRAFT Left 08/02/2019  Procedure: PLICATION OF ARTERIOVENOUS FISTULA LEFT ARM;  Surgeon: Elam Dutch, MD;  Location: [facility] [place];  Service: Vascular;  Laterality: Left;  . REVISON OF ARTERIOVENOUS FISTULA Left 01/06/2017   Procedure: REVISON OF ARTERIOVENOUS FISTULA;  Surgeon: Conrad [place], MD;  Location: [place];  Service: Vascular;  Laterality: Left;  . REVISON OF ARTERIOVENOUS FISTULA Left 10/27/2018   Procedure: REVISION PLICATION OF ARTERIOVENOUS FISTULA  LEFT  UPPER ARM;  Surgeon: Waynetta Sandy, MD;  Location: [place];  Service: Vascular;  Laterality: Left;  . UMBILICAL HERNIA REPAIR N/A 08/05/2016   Procedure: [place];  Surgeon: Donnie Mesa, MD;  Location: [place];  Service: General;  Laterality: N/A;  . VENOGRAM N/A 08/09/2012   Procedure: VENOGRAM;  Surgeon: Conrad , MD;  Location: [facility] CATH LAB;  Service: Cardiovascular;  Laterality: N/A;    Family History  Problem Relation Age of Onset  . Hypertension Mother   . Diabetes Mother   . Renal Disease Mother   . Hypertension Father     Social History:  reports that he quit smoking about 12 months ago. His smoking use included cigarettes. He quit after 30.00 years of use. He has never used smokeless tobacco. He reports current alcohol use of about 4.0 standard drinks of alcohol per week. He reports that he does not use drugs.  Allergies:  Allergies  Allergen Reactions  . Losartan Cough    Medications: Reviewed  Results for orders placed or performed during the hospital encounter of 11/16/19 (from the past 48 hour(s))  I-STAT 7, (LYTES, BLD GAS, ICA, H+H)     Status: Abnormal   Collection Time: 11/16/19  1:10 PM  Result Value Ref Range   pH, Arterial 7.434 7.350 - 7.450   pCO2 arterial 41.9 32.0 - 48.0 mmHg   pO2, Arterial 185.0 (H) 83.0 - 108.0 mmHg   Bicarbonate 28.1 (H) 20.0 - 28.0 mmol/L   TCO2 29 22 - 32 mmol/L   O2 Saturation 100.0 %   Acid-Base Excess 3.0 (H) 0.0 - 2.0 mmol/L   Sodium 138 135 - 145 mmol/L   Potassium 3.8 3.5 - 5.1 mmol/L   Calcium, Ion 1.21 1.15 - 1.40 mmol/L   HCT 39.0 39.0 - 52.0 %   Hemoglobin 13.3 13.0 - 17.0 g/dL   Patient temperature 98.4 F    Sample type ARTERIAL   CBG monitoring, ED     Status: Abnormal   Collection Time: 11/16/19  1:37 PM  Result Value Ref Range   Glucose-Capillary 134 (H) 70 - 99 mg/dL   Comment 1 Notify RN    Comment 2 Document in Chart   Lactic acid, plasma     Status: Abnormal    Collection Time: 11/16/19  1:46 PM  Result Value Ref Range   Lactic Acid, Venous 4.4 (HH) 0.5 - 1.9 mmol/L    Comment: CRITICAL VALUE NOTED.  VALUE IS CONSISTENT WITH PREVIOUSLY REPORTED AND CALLED VALUE. Performed at [facility], [place] [address] [place], [place] [address]   Brain natriuretic peptide     Status: Abnormal   Collection Time: 11/16/19  1:56 PM  Result Value Ref Range   B Natriuretic Peptide 962.8 (H) 0.0 - 100.0 pg/mL    Comment: Performed at [facility] [address], [place], [place] [address]  Procalcitonin - Baseline     Status: None   Collection Time: 11/16/19  1:57 PM  Result Value Ref Range   Procalcitonin 0.69  ng/mL    Comment:        Interpretation: PCT > 0.5 ng/mL and <= 2 ng/mL: Systemic infection (sepsis) is possible, but other conditions are known to elevate PCT as well. (NOTE)       Sepsis PCT Algorithm           Lower Respiratory Tract                                      Infection PCT Algorithm    ----------------------------     ----------------------------         PCT < 0.25 ng/mL                PCT < 0.10 ng/mL         Strongly encourage             Strongly discourage   discontinuation of antibiotics    initiation of antibiotics    ----------------------------     -----------------------------       PCT 0.25 - 0.50 ng/mL            PCT 0.10 - 0.25 ng/mL               OR       >80% decrease in PCT            Discourage initiation of                                            antibiotics      Encourage discontinuation           of antibiotics    ----------------------------     -----------------------------         PCT >= 0.50 ng/mL              PCT 0.26 - 0.50 ng/mL                AND       <80% decrease in PCT             Encourage initiation of                                             antibiotics       Encourage continuation           of antibiotics    ----------------------------     -----------------------------         PCT >= 0.50 ng/mL                  PCT > 0.50 ng/mL               AND         increase in PCT                  Strongly encourage                                      initiation of antibiotics    Strongly encourage escalation  of antibiotics                                     -----------------------------                                           PCT <= 0.25 ng/mL                                                 OR                                        > 80% decrease in PCT                                     Discontinue / Do not initiate                                             antibiotics Performed at [facility], [place] [address], [place], [place] [address]   TSH     Status: Abnormal   Collection Time: 11/16/19  5:30 PM  Result Value Ref Range   TSH 7.112 (H) 0.350 - 4.500 uIU/mL    Comment: Performed by a 3rd Generation assay with a functional sensitivity of <=0.01 uIU/mL. Performed at [facility], [place] [address], [place], [place] [address]   Troponin I (High Sensitivity)     Status: Abnormal   Collection Time: 11/16/19  5:30 PM  Result Value Ref Range   Troponin I (High Sensitivity) 679 (HH) <18 ng/L    Comment: CRITICAL VALUE NOTED.  VALUE IS CONSISTENT WITH PREVIOUSLY REPORTED AND CALLED VALUE. (NOTE) Elevated high sensitivity troponin I (hsTnI) values and significant  changes across serial measurements may suggest ACS but many other  chronic and acute conditions are known to elevate hsTnI results.  Refer to the Links section for chest pain algorithms and additional  guidance. Performed at [facility], [place] [address] [place], [place] [address]   Ethanol     Status: None   Collection Time: 11/16/19  5:30 PM  Result Value Ref Range   Alcohol, Ethyl (B) <10 <10 mg/dL    Comment: (NOTE) Lowest detectable limit for serum alcohol is 10 mg/dL. For medical purposes only. Performed at [facility], [place] [address], [place],  [place] [address]   Magnesium     Status: None   Collection Time: 11/16/19  5:30 PM  Result Value Ref Range   Magnesium 1.9 1.7 - 2.4 mg/dL    Comment: Performed at [facility] [address], [place], [place] [address]  Phosphorus     Status: Abnormal   Collection Time: 11/16/19  5:30 PM  Result Value Ref Range   Phosphorus 5.9 (H) 2.5 - 4.6 mg/dL    Comment: Performed at [facility]  [address] [place], [place] [address]  Glucose, capillary     Status: Abnormal   Collection Time: 11/16/19  5:39 PM  Result Value Ref Range   Glucose-Capillary 119 (H) 70 - 99 mg/dL  Lactic acid, plasma     Status: None   Collection Time: 11/16/19  5:40 PM  Result Value Ref Range   Lactic Acid, Venous 1.2 0.5 - 1.9 mmol/L    Comment: Performed at [facility] [address] [place], [place] [address]  I-STAT 7, (LYTES, BLD GAS, ICA, H+H)     Status: Abnormal   Collection Time: 11/16/19  5:41 PM  Result Value Ref Range   pH, Arterial 7.400 7.350 - 7.450   pCO2 arterial 42.1 32.0 - 48.0 mmHg   pO2, Arterial 75.0 (L) 83.0 - 108.0 mmHg   Bicarbonate 26.4 20.0 - 28.0 mmol/L   TCO2 28 22 - 32 mmol/L   O2 Saturation 96.0 %   Acid-Base Excess 1.0 0.0 - 2.0 mmol/L   Sodium 137 135 - 145 mmol/L   Potassium 3.5 3.5 - 5.1 mmol/L   Calcium, Ion 1.19 1.15 - 1.40 mmol/L   HCT 41.0 39.0 - 52.0 %   Hemoglobin 13.9 13.0 - 17.0 g/dL   Patient temperature 35.7 C    Collection site ARTERIAL LINE    Drawn by Nurse    Sample type ARTERIAL   I-STAT, chem 8     Status: Abnormal   Collection Time: 11/16/19  5:47 PM  Result Value Ref Range   Sodium 139 135 - 145 mmol/L   Potassium 3.6 3.5 - 5.1 mmol/L   Chloride 98 98 - 111 mmol/L   BUN 32 (H) 6 - 20 mg/dL   Creatinine, Ser 8.00 (H) 0.61 - 1.24 mg/dL   Glucose, Bld 125 (H) 70 - 99 mg/dL   Calcium, Ion 1.19 1.15 - 1.40 mmol/L   TCO2 27 22 - 32 mmol/L   Hemoglobin 13.6 13.0 - 17.0 g/dL   HCT 40.0 39.0 - 52.0 %  Culture, blood (routine x 2)      Status: None (Preliminary result)   Collection Time: 11/16/19  6:03 PM   Specimen: BLOOD RIGHT HAND  Result Value Ref Range   Specimen Description BLOOD RIGHT HAND    Special Requests      BOTTLES DRAWN AEROBIC AND ANAEROBIC Blood Culture results may not be optimal due to an inadequate volume of blood received in culture bottles   Culture      NO GROWTH 2 DAYS Performed at [facility], [place] [address], [place], [place] [address]    Report Status PENDING   MRSA PCR Screening     Status: None   Collection Time: 11/16/19  6:05 PM   Specimen: Nasal Mucosa; Nasopharyngeal  Result Value Ref Range   MRSA by PCR NEGATIVE NEGATIVE    Comment:        The GeneXpert MRSA Assay (FDA approved for NASAL specimens only), is one component of a comprehensive MRSA colonization surveillance program. It is not intended to diagnose MRSA infection nor to guide or monitor treatment for MRSA infections. Performed at [facility], [place] [address], [place], [place] [address]   Culture, blood (routine x 2)     Status: None (Preliminary result)   Collection Time: 11/16/19  6:08 PM   Specimen: BLOOD RIGHT HAND  Result Value Ref Range   Specimen Description BLOOD RIGHT HAND    Special Requests  BOTTLES DRAWN AEROBIC ONLY Blood Culture results may not be optimal due to an inadequate volume of blood received in culture bottles   Culture      NO GROWTH 2 DAYS Performed at [facility] [address] [place], [place] [address]    Report Status PENDING   Glucose, capillary     Status: Abnormal   Collection Time: 11/16/19  7:05 PM  Result Value Ref Range   Glucose-Capillary 146 (H) 70 - 99 mg/dL  Glucose, capillary     Status: Abnormal   Collection Time: 11/16/19  8:01 PM  Result Value Ref Range   Glucose-Capillary 165 (H) 70 - 99 mg/dL  I-STAT, chem 8     Status: Abnormal   Collection Time: 11/16/19  8:04 PM  Result Value Ref Range   Sodium 138 135 - 145 mmol/L   Potassium  3.5 3.5 - 5.1 mmol/L   Chloride 98 98 - 111 mmol/L   BUN 33 (H) 6 - 20 mg/dL   Creatinine, Ser 8.00 (H) 0.61 - 1.24 mg/dL   Glucose, Bld 167 (H) 70 - 99 mg/dL   Calcium, Ion 1.20 1.15 - 1.40 mmol/L   TCO2 23 22 - 32 mmol/L   Hemoglobin 13.6 13.0 - 17.0 g/dL   HCT 40.0 39.0 - 52.0 %  Protime-INR now and repeat in 8 hours     Status: Abnormal   Collection Time: 11/16/19  8:05 PM  Result Value Ref Range   Prothrombin Time 19.2 (H) 11.4 - 15.2 seconds   INR 1.6 (H) 0.8 - 1.2    Comment: (NOTE) INR goal varies based on device and disease states. Performed at [facility], [place] [address], [place], [place] [address]   APTT now and repeat in 8 hours     Status: Abnormal   Collection Time: 11/16/19  8:05 PM  Result Value Ref Range   aPTT 51 (H) 24 - 36 seconds    Comment:        IF BASELINE aPTT IS ELEVATED, SUGGEST PATIENT RISK ASSESSMENT BE USED TO DETERMINE APPROPRIATE ANTICOAGULANT THERAPY. Performed at [facility], [place] [address] [place], [place] [address]   Lactic acid, plasma     Status: None   Collection Time: 11/16/19  8:05 PM  Result Value Ref Range   Lactic Acid, Venous 1.3 0.5 - 1.9 mmol/L    Comment: Performed at [facility] [address], [place], [place] [address]  Glucose, capillary     Status: Abnormal   Collection Time: 11/16/19  9:05 PM  Result Value Ref Range   Glucose-Capillary 152 (H) 70 - 99 mg/dL  Glucose, capillary     Status: Abnormal   Collection Time: 11/16/19  9:45 PM  Result Value Ref Range   Glucose-Capillary 147 (H) 70 - 99 mg/dL  I-STAT, chem 8     Status: Abnormal   Collection Time: 11/16/19  9:48 PM  Result Value Ref Range   Sodium 138 135 - 145 mmol/L   Potassium 3.6 3.5 - 5.1 mmol/L   Chloride 99 98 - 111 mmol/L   BUN 32 (H) 6 - 20 mg/dL   Creatinine, Ser 8.10 (H) 0.61 - 1.24 mg/dL   Glucose, Bld 143 (H) 70 - 99 mg/dL   Calcium, Ion 1.15 1.15 - 1.40 mmol/L   TCO2 23 22 - 32 mmol/L   Hemoglobin 13.3 13.0 - 17.0  g/dL   HCT 39.0 39.0 - 52.0 %  Glucose,  capillary     Status: Abnormal   Collection Time: 11/16/19 11:03 PM  Result Value Ref Range   Glucose-Capillary 137 (H) 70 - 99 mg/dL  Glucose, capillary     Status: Abnormal   Collection Time: 11/17/19 12:06 AM  Result Value Ref Range   Glucose-Capillary 128 (H) 70 - 99 mg/dL  I-STAT, chem 8     Status: Abnormal   Collection Time: 11/17/19 12:10 AM  Result Value Ref Range   Sodium 138 135 - 145 mmol/L   Potassium 3.6 3.5 - 5.1 mmol/L   Chloride 102 98 - 111 mmol/L   BUN 32 (H) 6 - 20 mg/dL   Creatinine, Ser 8.80 (H) 0.61 - 1.24 mg/dL   Glucose, Bld 127 (H) 70 - 99 mg/dL   Calcium, Ion 1.06 (L) 1.15 - 1.40 mmol/L   TCO2 24 22 - 32 mmol/L   Hemoglobin 13.3 13.0 - 17.0 g/dL   HCT 39.0 39.0 - 52.0 %  Glucose, capillary     Status: Abnormal   Collection Time: 11/17/19  1:16 AM  Result Value Ref Range   Glucose-Capillary 120 (H) 70 - 99 mg/dL  Glucose, capillary     Status: Abnormal   Collection Time: 11/17/19  2:14 AM  Result Value Ref Range   Glucose-Capillary 122 (H) 70 - 99 mg/dL  Glucose, capillary     Status: Abnormal   Collection Time: 11/17/19  3:20 AM  Result Value Ref Range   Glucose-Capillary 119 (H) 70 - 99 mg/dL  Glucose, capillary     Status: None   Collection Time: 11/17/19  4:34 AM  Result Value Ref Range   Glucose-Capillary 98 70 - 99 mg/dL  CBC     Status: Abnormal   Collection Time: 11/17/19  5:00 AM  Result Value Ref Range   WBC 11.1 (H) 4.0 - 10.5 K/uL   RBC 3.98 (L) 4.22 - 5.81 MIL/uL   Hemoglobin 12.2 (L) 13.0 - 17.0 g/dL   HCT 37.0 (L) 39.0 - 52.0 %   MCV 93.0 80.0 - 100.0 fL   MCH 30.7 26.0 - 34.0 pg   MCHC 33.0 30.0 - 36.0 g/dL   RDW 15.7 (H) 11.5 - 15.5 %   Platelets 125 (L) 150 - 400 K/uL   nRBC 0.0 0.0 - 0.2 %    Comment: Performed at [facility], [address], [place], [place] [address]  Basic metabolic panel     Status: Abnormal   Collection Time: 11/17/19  5:00 AM  Result Value Ref Range    Sodium 136 135 - 145 mmol/L   Potassium 4.0 3.5 - 5.1 mmol/L   Chloride 100 98 - 111 mmol/L   CO2 21 (L) 22 - 32 mmol/L   Glucose, Bld 110 (H) 70 - 99 mg/dL   BUN 37 (H) 6 - 20 mg/dL   Creatinine, Ser 8.39 (H) 0.61 - 1.24 mg/dL   Calcium 8.4 (L) 8.9 - 10.3 mg/dL   GFR calc non Af Amer 7 (L) >60 mL/min   GFR calc Af Amer 8 (L) >60 mL/min   Anion gap 15 5 - 15    Comment: Performed at [facility] [address] [place], [place] [address]  Magnesium     Status: None   Collection Time: 11/17/19  5:00 AM  Result Value Ref Range   Magnesium 2.0 1.7 - 2.4 mg/dL    Comment: Performed at [facility], [place] [address], [place], [place]  [address]  Phosphorus     Status: Abnormal   Collection Time: 11/17/19  5:00 AM  Result Value Ref Range   Phosphorus 5.2 (H) 2.5 - 4.6 mg/dL    Comment: Performed at [facility] [address] [place], [place] [address]  Procalcitonin     Status: None   Collection Time: 11/17/19  5:00 AM  Result Value Ref Range   Procalcitonin 24.16 ng/mL    Comment:        Interpretation: PCT >= 10 ng/mL: Important systemic inflammatory response, almost exclusively due to severe bacterial sepsis or septic shock. (NOTE)       Sepsis PCT Algorithm           Lower Respiratory Tract                                      Infection PCT Algorithm    ----------------------------     ----------------------------         PCT < 0.25 ng/mL                PCT < 0.10 ng/mL         Strongly encourage             Strongly discourage   discontinuation of antibiotics    initiation of antibiotics    ----------------------------     -----------------------------       PCT 0.25 - 0.50 ng/mL            PCT 0.10 - 0.25 ng/mL               OR       >80% decrease in PCT            Discourage initiation of                                            antibiotics      Encourage discontinuation           of antibiotics    ----------------------------      -----------------------------         PCT >= 0.50 ng/mL              PCT 0.26 - 0.50 ng/mL                AND       <80% decrease in PCT             Encourage initiation of                                             antibiotics       Encourage continuation           of antibiotics    ----------------------------     -----------------------------        PCT >= 0.50 ng/mL                  PCT > 0.50 ng/mL               AND         increase in PCT  Strongly encourage                                      initiation of antibiotics    Strongly encourage escalation           of antibiotics                                     -----------------------------                                           PCT <= 0.25 ng/mL                                                 OR                                        > 80% decrease in PCT                                     Discontinue / Do not initiate                                             antibiotics Performed at [facility], [place] [address] [place], [place] [address]   Glucose, capillary     Status: Abnormal   Collection Time: 11/17/19  5:31 AM  Result Value Ref Range   Glucose-Capillary 104 (H) 70 - 99 mg/dL  I-STAT 7, (LYTES, BLD GAS, ICA, H+H)     Status: Abnormal   Collection Time: 11/17/19  5:34 AM  Result Value Ref Range   pH, Arterial 7.474 (H) 7.350 - 7.450   pCO2 arterial 30.4 (L) 32.0 - 48.0 mmHg   pO2, Arterial 170.0 (H) 83.0 - 108.0 mmHg   Bicarbonate 22.4 20.0 - 28.0 mmol/L   TCO2 23 22 - 32 mmol/L   O2 Saturation 100.0 %   Sodium 137 135 - 145 mmol/L   Potassium 3.8 3.5 - 5.1 mmol/L   Calcium, Ion 1.06 (L) 1.15 - 1.40 mmol/L   HCT 38.0 (L) 39.0 - 52.0 %   Hemoglobin 12.9 (L) 13.0 - 17.0 g/dL   Patient temperature HIDE    Sample type ARTERIAL   Heparin level (unfractionated)     Status: Abnormal   Collection Time: 11/17/19  5:41 AM  Result Value Ref Range   Heparin Unfractionated 0.12 (L) 0.30 -  0.70 IU/mL    Comment: (NOTE) If heparin results are below expected values, and patient dosage has  been confirmed, suggest follow up testing of antithrombin III levels. Performed at [facility], [place] [address], [place], [place] [address]   Glucose, capillary     Status: Abnormal   Collection Time: 11/17/19  6:05 AM  Result Value Ref Range  Glucose-Capillary 103 (H) 70 - 99 mg/dL  Glucose, capillary     Status: None   Collection Time: 11/17/19  6:55 AM  Result Value Ref Range   Glucose-Capillary 96 70 - 99 mg/dL  Glucose, capillary     Status: None   Collection Time: 11/17/19  9:14 AM  Result Value Ref Range   Glucose-Capillary 97 70 - 99 mg/dL  Glucose, capillary     Status: None   Collection Time: 11/17/19 11:28 AM  Result Value Ref Range   Glucose-Capillary 96 70 - 99 mg/dL  I-STAT, chem 8     Status: Abnormal   Collection Time: 11/17/19 12:19 PM  Result Value Ref Range   Sodium 137 135 - 145 mmol/L   Potassium 4.3 3.5 - 5.1 mmol/L   Chloride 101 98 - 111 mmol/L   BUN 36 (H) 6 - 20 mg/dL   Creatinine, Ser 8.50 (H) 0.61 - 1.24 mg/dL   Glucose, Bld 97 70 - 99 mg/dL   Calcium, Ion 1.08 (L) 1.15 - 1.40 mmol/L   TCO2 22 22 - 32 mmol/L   Hemoglobin 13.6 13.0 - 17.0 g/dL   HCT 40.0 39.0 - 52.0 %  Heparin level (unfractionated)     Status: Abnormal   Collection Time: 11/17/19  1:00 PM  Result Value Ref Range   Heparin Unfractionated 0.28 (L) 0.30 - 0.70 IU/mL    Comment: (NOTE) If heparin results are below expected values, and patient dosage has  been confirmed, suggest follow up testing of antithrombin III levels. Performed at [facility], [place] [address], [place], [place] [address]   Glucose, capillary     Status: Abnormal   Collection Time: 11/17/19  1:03 PM  Result Value Ref Range   Glucose-Capillary 104 (H) 70 - 99 mg/dL  Glucose, capillary     Status: None   Collection Time: 11/17/19  3:10 PM  Result Value Ref Range   Glucose-Capillary 90 70 - 99  mg/dL  Glucose, capillary     Status: Abnormal   Collection Time: 11/17/19  5:07 PM  Result Value Ref Range   Glucose-Capillary 65 (L) 70 - 99 mg/dL  I-STAT, chem 8     Status: Abnormal   Collection Time: 11/17/19  6:13 PM  Result Value Ref Range   Sodium 137 135 - 145 mmol/L   Potassium 4.1 3.5 - 5.1 mmol/L   Chloride 100 98 - 111 mmol/L   BUN 35 (H) 6 - 20 mg/dL   Creatinine, Ser 8.70 (H) 0.61 - 1.24 mg/dL   Glucose, Bld 134 (H) 70 - 99 mg/dL   Calcium, Ion 1.08 (L) 1.15 - 1.40 mmol/L   TCO2 22 22 - 32 mmol/L   Hemoglobin 12.6 (L) 13.0 - 17.0 g/dL   HCT 37.0 (L) 39.0 - 52.0 %  Glucose, capillary     Status: None   Collection Time: 11/17/19  7:52 PM  Result Value Ref Range   Glucose-Capillary 82 70 - 99 mg/dL  Heparin level (unfractionated)     Status: Abnormal   Collection Time: 11/17/19  8:00 PM  Result Value Ref Range   Heparin Unfractionated 0.24 (L) 0.30 - 0.70 IU/mL    Comment: (NOTE) If heparin results are below expected values, and patient dosage has  been confirmed, suggest follow up testing of antithrombin III levels. Performed at [facility], [place] [address], [place], [place] [address]   Glucose, capillary     Status: None   Collection Time:  11/18/19 12:45 AM  Result Value Ref Range   Glucose-Capillary 85 70 - 99 mg/dL  Heparin level (unfractionated)     Status: Abnormal   Collection Time: 11/18/19  3:05 AM  Result Value Ref Range   Heparin Unfractionated 0.18 (L) 0.30 - 0.70 IU/mL    Comment: (NOTE) If heparin results are below expected values, and patient dosage has  been confirmed, suggest follow up testing of antithrombin III levels. Performed at [facility], [place] [address], [place], [place] [address]   Procalcitonin     Status: None   Collection Time: 11/18/19  3:06 AM  Result Value Ref Range   Procalcitonin 31.62 ng/mL    Comment:        Interpretation: PCT >= 10 ng/mL: Important systemic inflammatory response, almost exclusively  due to severe bacterial sepsis or septic shock. (NOTE)       Sepsis PCT Algorithm           Lower Respiratory Tract                                      Infection PCT Algorithm    ----------------------------     ----------------------------         PCT < 0.25 ng/mL                PCT < 0.10 ng/mL         Strongly encourage             Strongly discourage   discontinuation of antibiotics    initiation of antibiotics    ----------------------------     -----------------------------       PCT 0.25 - 0.50 ng/mL            PCT 0.10 - 0.25 ng/mL               OR       >80% decrease in PCT            Discourage initiation of                                            antibiotics      Encourage discontinuation           of antibiotics    ----------------------------     -----------------------------         PCT >= 0.50 ng/mL              PCT 0.26 - 0.50 ng/mL                AND       <80% decrease in PCT             Encourage initiation of                                             antibiotics       Encourage continuation           of antibiotics    ----------------------------     -----------------------------        PCT >= 0.50 ng/mL  PCT > 0.50 ng/mL               AND         increase in PCT                  Strongly encourage                                      initiation of antibiotics    Strongly encourage escalation           of antibiotics                                     -----------------------------                                           PCT <= 0.25 ng/mL                                                 OR                                        > 80% decrease in PCT                                     Discontinue / Do not initiate                                             antibiotics Performed at [facility], [address], [place], [place] [address]   AM Basic metabolic panel     Status: Abnormal   Collection Time: 11/18/19  3:06 AM   Result Value Ref Range   Sodium 138 135 - 145 mmol/L   Potassium 4.1 3.5 - 5.1 mmol/L   Chloride 101 98 - 111 mmol/L   CO2 22 22 - 32 mmol/L   Glucose, Bld 83 70 - 99 mg/dL   BUN 42 (H) 6 - 20 mg/dL   Creatinine, Ser 8.77 (H) 0.61 - 1.24 mg/dL   Calcium 8.1 (L) 8.9 - 10.3 mg/dL   GFR calc non Af Amer 6 (L) >60 mL/min   GFR calc Af Amer 7 (L) >60 mL/min   Anion gap 15 5 - 15    Comment: Performed at [facility], [place] [address], [place],  [address]  AM Magnesium     Status: None   Collection Time: 11/18/19  3:06 AM  Result Value Ref Range   Magnesium 1.8 1.7 - 2.4 mg/dL    Comment: Performed at [place] [address], [place],  [address]  AM Phosphorus     Status: Abnormal   Collection Time: 11/18/19  3:06 AM  Result Value Ref Range   Phosphorus 5.9 (H) 2.5 - 4.6  mg/dL    Comment: Performed at [facility], [place] [address], [place], [place] [address]  Glucose, capillary     Status: Abnormal   Collection Time: 11/18/19  4:56 AM  Result Value Ref Range   Glucose-Capillary 60 (L) 70 - 99 mg/dL  Glucose, capillary     Status: None   Collection Time: 11/18/19  5:44 AM  Result Value Ref Range   Glucose-Capillary 95 70 - 99 mg/dL  Glucose, capillary     Status: Abnormal   Collection Time: 11/18/19  8:31 AM  Result Value Ref Range   Glucose-Capillary 39 (LL) 70 - 99 mg/dL   Comment 1 Notify RN   Glucose, capillary     Status: Abnormal   Collection Time: 11/18/19  8:32 AM  Result Value Ref Range   Glucose-Capillary 65 (L) 70 - 99 mg/dL  Glucose, capillary     Status: Abnormal   Collection Time: 11/18/19 10:11 AM  Result Value Ref Range   Glucose-Capillary 66 (L) 70 - 99 mg/dL  Glucose, capillary     Status: Abnormal   Collection Time: 11/18/19 10:43 AM  Result Value Ref Range   Glucose-Capillary 110 (H) 70 - 99 mg/dL  Glucose, capillary     Status: Abnormal   Collection Time: 11/18/19 11:34 AM  Result Value Ref Range   Glucose-Capillary  66 (L) 70 - 99 mg/dL  Glucose, capillary     Status: None   Collection Time: 11/18/19 12:33 PM  Result Value Ref Range   Glucose-Capillary 87 70 - 99 mg/dL    Ct Head Wo Contrast  Result Date: 11/16/2019 CLINICAL DATA:  Status post cardiac arrest and CPR. EXAM: CT HEAD WITHOUT CONTRAST TECHNIQUE: Contiguous axial images were obtained from the base of the skull through the vertex without intravenous contrast. COMPARISON:  Head CT 07/15/2012 FINDINGS: Brain: Streak artifact from overlying monitoring devices particularly in the left frontal region. No intracranial hemorrhage, mass effect, or midline shift. No evidence of cerebral edema. Gray-white differentiation is preserved. No hydrocephalus. The basilar cisterns are patent. No evidence of territorial infarct or acute ischemia. No extra-axial or intracranial fluid collection. Vascular: No hyperdense vessel. Skull: No fracture or focal lesion. Sinuses/Orbits: Fluid within the nasopharynx. Fluid level in the right maxillary sinus, sphenoid sinuses, and throughout the ethmoid air cells. No acute orbital abnormality. Minimal opacification of lower mastoid air cells. Other: None. IMPRESSION: 1. No acute intracranial abnormality, allowing for mild streak artifact limitations. 2. Inflammatory changes in the paranasal sinuses, likely due to tracheostomy tube placement. Electronically Signed   By: Keith Rake M.D.   On: 11/16/2019 15:33   Ct Angio Chest Pe W Or Wo Contrast  Result Date: 11/16/2019 CLINICAL DATA:  Cardiac arrest during dialysis today. EXAM: CT ANGIOGRAPHY CHEST WITH CONTRAST TECHNIQUE: Multidetector CT imaging of the chest was performed using the standard protocol during bolus administration of intravenous contrast. Multiplanar CT image reconstructions and MIPs were obtained to evaluate the vascular anatomy. CONTRAST:  147mL OMNIPAQUE IOHEXOL 350 MG/ML SOLN COMPARISON:  05/19/2017. FINDINGS: Cardiovascular: Image quality is degraded by  respiratory motion, expiratory phase imaging and streak artifact from the patient's arms. These factors limit evaluation of the segmental and subsegmental pulmonary arteries. There are suspected filling defects, however, at the segmental level in the right middle lobe (7/220) and left lower lobe (7/216). No large central or lobar pulmonary embolus. Atherosclerotic calcification of the aorta and coronary arteries. Pulmonic trunk and heart are enlarged. No pericardial effusion. Mediastinum/Nodes: Air is  seen in the superior mediastinum. Mediastinal lymph nodes measure up to 1.5 cm in the prevascular space, as before. No definite hilar or axillary adenopathy. Esophagus is grossly unremarkable. Lungs/Pleura: Tracheostomy is in place. Large right pleural effusion with collapse/consolidation in the adjacent right upper and right lower lobes. There is airspace consolidation in the left lower lobe with a rounded area of fluid density in the base of the left hemithorax, adjacent to the left hemidiaphragm, measuring 4.2 cm (5/107), new from 05/18/2017. Increased attenuation throughout the lungs is likely due to expiratory phase imaging. No pneumothorax. Trace left pleural fluid. Airway is otherwise unremarkable. Upper Abdomen: Reflux of contrast into the IVC and hepatic veins. Again, streak artifact from the patient's arms degrades image quality. Visualized portions of the liver, adrenal glands, spleen, pancreas and stomach are grossly unremarkable. Perihepatic and perisplenic ascites. Musculoskeletal: Acute anterior rib fractures bilaterally, related to cardiopulmonary resuscitation. Subcutaneous emphysema is seen in the left neck and upper left chest. Old posterior right rib fractures. Bones appear dense, indicative of renal osteodystrophy. Review of the MIP images confirms the above findings. IMPRESSION: 1. Assessment for pulmonary emboli is fairly compromised by respiratory motion, expiratory phase imaging and streak  artifact from the patient's arms. There are segmental low-attenuation filling defects associated with the right middle and left lower lobe pulmonary arteries, however, suspicious for pulmonary emboli. No large central or lobar pulmonary embolus. 2. Pneumomediastinum and subcutaneous emphysema with bilateral anterior rib fractures related to cardiopulmonary resuscitation. No pneumothorax. 3. Large right pleural effusion. New loculated collection of fluid at the base of the left hemithorax may be within the left pleural space. A new small diaphragmatic defect on the left cannot be excluded, given aggressive cardiopulmonary resuscitation. 4. Collapse/consolidation in the right middle and right lower lobes. Additional airspace consolidation in the left lower lobe. Findings can be seen with aspiration. 5. Mediastinal adenopathy may be reactive. Difficult to exclude a lymphoproliferative disorder. 6. Ascites. 7. Aortic atherosclerosis (ICD10-170.0). Coronary artery calcification. 8. Enlarged pulmonic trunk, indicative of pulmonary arterial hypertension. Electronically Signed   By: Lorin Picket M.D.   On: 11/16/2019 16:08   Am Dg Chest Port 1 View  Result Date: 11/18/2019 CLINICAL DATA:  Cardiac arrest. EXAM: PORTABLE CHEST 1 VIEW COMPARISON:  11/17/2019 FINDINGS: Tracheostomy tube in adequate position. Right subclavian central venous catheter with tip over the SVC. Lungs are adequately inflated demonstrate moderate stable hazy bilateral perihilar opacification likely moderate interstitial edema. Possible small amount right pleural fluid and basilar atelectasis. Stable cardiomegaly. Remainder of the exam is unchanged. IMPRESSION: 1. Stable cardiomegaly with evidence of stable moderate interstitial edema and possible small right effusion/atelectasis. 2.  Tubes and lines as described. Electronically Signed   By: Marin Olp M.D.   On: 11/18/2019 08:20   Dg Chest Port 1 View  Result Date: 11/17/2019 CLINICAL  DATA:  Respiratory failure. EXAM: PORTABLE CHEST 1 VIEW COMPARISON:  11/16/2019 FINDINGS: Tracheostomy tube and central venous catheter appear in good position, unchanged. Persistent cardiomegaly. The bilateral pulmonary infiltrates have improved. However, there is increased density at the lung bases, likely representing increased effusions. No other change. IMPRESSION: 1. Increased bilateral pleural effusions. 2. Improved bilateral pulmonary infiltrates. 3. No other change. 4. Stable cardiomegaly. Electronically Signed   By: Lorriane Shire M.D.   On: 11/17/2019 12:10   Dg Chest Port 1 View  Result Date: 11/16/2019 CLINICAL DATA:  Central line insertion. Pulmonary infiltrates. EXAM: PORTABLE CHEST 1 VIEW 6:28 p.m. COMPARISON:  Chest x-ray 11/16/2019 at 10:57 a.m.  FINDINGS: Tracheostomy tube in good position. New right central venous catheter tip is in the superior vena cava approximately 3 cm below the carina in good position. The bilateral pulmonary infiltrates have progressed particularly in the right upper and mid lung zone. Cardiomegaly. No acute bone abnormality. IMPRESSION: 1. New right central venous catheter is in good position. 2. Progressive bilateral pulmonary infiltrates, particularly in the right upper and mid lung zone. 3. No pneumothorax. Electronically Signed   By: Lorriane Shire M.D.   On: 11/16/2019 18:40    XTA:[id number] except as listed in admit H&P  Blood pressure 108/62, pulse (!) 117, temperature 99 F (37.2 C), temperature source Rectal, resp. rate 14, height 5' 10.5" (1.791 m), weight 80 kg, SpO2 97 %.  PHYSICAL EXAM: Overall appearance: On a ventilator, comatose. Head:  Normocephalic, atraumatic. Ears: External ears look healthy. Nose: External nose is healthy in appearance. Internal nasal exam free of any lesions or obstruction. Oral Cavity/Pharynx:  There are no mucosal lesions or masses identified. Larynx/Hypopharynx: Deferred Neuro: Comatose. Neck: Tracheostomy tube  in place within the cricothyrotomy site.  No palpable neck masses.  Studies Reviewed: none  Procedures: none   Assessment/Plan: Airway is stable at the time of the evaluation today.  I am available over the weekend if any needs arise including converting to a formal tracheostomy.  Izora Gala 11/18/2019, 1:08 PM

## 2019-11-18 NOTE — Progress Notes (Signed)
Pharmacy Antibiotic Note  [name] [name] is a 53 y.o. male admitted on 11/16/2019 with aspiration pneumonia.  Pharmacy has been consulted for amp/sulbactam dosing. Patient is normally on MWF HD, currently on TTM protocol and CRRT.   Plan: Start amp/sulb 3g Q8 hr while on CRRT F/u if switched to regular HD, adjust dose to 1.5g Q12hr    Height: 5' 10.5" (179.1 cm) Weight: 176 lb 5.9 oz (80 kg) IBW/kg (Calculated) : 74.15  Temp (24hrs), Avg:93.7 F (34.3 C), Min:89.8 F (32.1 C), Max:98.1 F (36.7 C)  Recent Labs  Lab 11/16/19 1101  11/16/19 1116 11/16/19 1238 11/16/19 1346 11/16/19 1740  11/16/19 2005  11/17/19 0010 11/17/19 0500 11/17/19 1219 11/17/19 1813 11/18/19 0306  WBC  --   --  8.8 10.0  --   --   --   --   --   --  11.1*  --   --   --   CREATININE  --    < > 7.01*  --   --   --    < >  --    < > 8.80* 8.39* 8.50* 8.70* 8.77*  LATICACIDVEN 5.9*  --   --   --  4.4* 1.2  --  1.3  --   --   --   --   --   --    < > = values in this interval not displayed.    Estimated Creatinine Clearance: 10.5 mL/min (A) (by C-G formula based on SCr of 8.77 mg/dL (H)).    Allergies  Allergen Reactions  . Losartan Cough    Antimicrobials this admission: Amp/sulb 12/4 >>    Microbiology results: 12/4 UCx: pend  12/4 Sputum: pend  12/2 BCx: ngtd 12/2 MRSA PCR: neg 12/2 Covid neg   Thank you for allowing pharmacy to be a part of this patient's care.   Benetta Spar, PharmD, BCPS, [facility] Clinical Pharmacist  Please check AMION for all [place] phone numbers After 10:00 PM, call [place] [phone]'

## 2019-11-18 NOTE — Progress Notes (Signed)
[facility] ROUNDING NOTE   Subjective:   Is a 53 year old gentleman history of SVT systolic heart failure polysubstance abuse end-stage renal disease Monday Wednesday Friday dialysis [facility] status post PEA arrest 11/16/2019.  Occurred about 3-1/2 hours into hemodialysis treatment received 3 rounds of epinephrine 1 g of calcium 50 mEq of bicarbonate with ROSC of about 15 minutes.  Emergent cricothyroitomy performed by EMS due to failed intubation.  Blood pressure 82/62 pulse 98 temperature 96.8 O2 sats 98% FiO2 40%  IV norepinephrine IV heparin  Sodium 138 potassium 4.1 chloride 101 CO2 22 BUN 42 creatinine 8.77 glucose 83 calcium 8.1 phosphorus 5.9 magnesium 1.8 hemoglobin 12.9 WBC 11.1 platelets 125  There has been no dialysis performed since admission 11/16/2019  IV Protonix   Objective:  Vital signs in last 24 hours:  Temp:  [89.8 F (32.1 C)-97.5 F (36.4 C)] 96.8 F (36 C) (12/04 0700) Pulse Rate:  [25-104] 104 (12/04 0700) Resp:  [9-19] 14 (12/04 0700) BP: (76-112)/(55-86) 87/67 (12/04 0700) SpO2:  [91 %-100 %] 100 % (12/04 0700) Arterial Line BP: (85-129)/(53-83) 96/59 (12/04 0700) FiO2 (%):  [40 %] 40 % (12/04 0359)  Weight change:  Filed Weights   11/16/19 1300  Weight: 80 kg    Intake/Output: I/O last 3 completed shifts: In: 2432.5 [I.V.:2432.5] Out: 0    Intake/Output this shift:  No intake/output data recorded.  Cricothyrotomy, sedated, paralyzed LUE AVF +B/T Brady, nl s1s2 Coarse bs b/l S/mild distension No LEE   Basic Metabolic Panel: Recent Labs  Lab 11/16/19 1116  11/16/19 1730  11/17/19 0010 11/17/19 0500 11/17/19 0534 11/17/19 1219 11/17/19 1813 11/18/19 0306  NA 140   < >  --    < > 138 136 137 137 137 138  K 3.4*   < >  --    < > 3.6 4.0 3.8 4.3 4.1 4.1  CL 100  --   --    < > 102 100  --  101 100 101  CO2 23  --   --   --   --  21*  --   --   --  22  GLUCOSE 117*  --   --    < > 127* 110*   --  97 134* 83  BUN 23*  --   --    < > 32* 37*  --  36* 35* 42*  CREATININE 7.01*  --   --    < > 8.80* 8.39*  --  8.50* 8.70* 8.77*  CALCIUM 9.6  --   --   --   --  8.4*  --   --   --  8.1*  MG  --   --  1.9  --   --  2.0  --   --   --  1.8  PHOS  --   --  5.9*  --   --  5.2*  --   --   --  5.9*   < > = values in this interval not displayed.    Liver Function Tests: Recent Labs  Lab 11/16/19 1116  AST 30  ALT 17  ALKPHOS 98  BILITOT 1.2  PROT 7.7  ALBUMIN 3.5   No results for input(s): LIPASE, AMYLASE in the last 168 hours. No results for input(s): AMMONIA in the last 168 hours.  CBC: Recent Labs  Lab 11/16/19 1116 11/16/19 1238  11/17/19 0010 11/17/19 0500 11/17/19 0534 11/17/19 1219 11/17/19 1813  WBC 8.8 10.0  --   --  11.1*  --   --   --   NEUTROABS 5.6  --   --   --   --   --   --   --   HGB 13.1 12.4*   < > 13.3 12.2* 12.9* 13.6 12.6*  HCT 42.5 34.0*   < > 39.0 37.0* 38.0* 40.0 37.0*  MCV 100.2* 98.6  --   --  93.0  --   --   --   PLT 170 178  --   --  125*  --   --   --    < > = values in this interval not displayed.    Cardiac Enzymes: No results for input(s): CKTOTAL, CKMB, CKMBINDEX, TROPONINI in the last 168 hours.  BNP: Invalid input(s): POCBNP  CBG: Recent Labs  Lab 11/17/19 1707 11/17/19 1952 11/18/19 0045 11/18/19 0456 11/18/19 0544  GLUCAP 65* 82 5 60* 95    Microbiology: Results for orders placed or performed during the hospital encounter of 11/16/19  SARS Coronavirus 2 by RT PCR (hospital order, performed in [facility] hospital lab) Nasopharyngeal Nasopharyngeal Swab     Status: None   Collection Time: 11/16/19 11:41 AM   Specimen: Nasopharyngeal Swab  Result Value Ref Range Status   SARS Coronavirus 2 NEGATIVE NEGATIVE Final    Comment: (NOTE) SARS-CoV-2 target nucleic acids are NOT DETECTED. The SARS-CoV-2 RNA is generally detectable in upper and lower respiratory specimens during the acute phase of infection. The  lowest concentration of SARS-CoV-2 viral copies this assay can detect is 250 copies / mL. A negative result does not preclude SARS-CoV-2 infection and should not be used as the sole basis for treatment or other patient management decisions.  A negative result may occur with improper specimen collection / handling, submission of specimen other than nasopharyngeal swab, presence of viral mutation(s) within the areas targeted by this assay, and inadequate number of viral copies (<250 copies / mL). A negative result must be combined with clinical observations, patient history, and epidemiological information. Fact Sheet for Patients:   StrictlyIdeas.no Fact Sheet for Healthcare Providers: BankingDealers.co.za This test is not yet approved or cleared  by the [place] FDA and has been authorized for detection and/or diagnosis of SARS-CoV-2 by FDA under an Emergency Use Authorization (EUA).  This EUA will remain in effect (meaning this test can be used) for the duration of the COVID-19 declaration under Section 564(b)(1) of the Act, 21 U.S.C. section 360bbb-3(b)(1), unless the authorization is terminated or revoked sooner. Performed at [facility], [place] [address] [place], [place] [address]   Culture, blood (routine x 2)     Status: None (Preliminary result)   Collection Time: 11/16/19  6:03 PM   Specimen: BLOOD RIGHT HAND  Result Value Ref Range Status   Specimen Description BLOOD RIGHT HAND  Final   Special Requests   Final    BOTTLES DRAWN AEROBIC AND ANAEROBIC Blood Culture results may not be optimal due to an inadequate volume of blood received in culture bottles   Culture   Final    NO GROWTH < 12 HOURS Performed at [facility], [place] [address], [place], [place] [address]    Report Status PENDING  Incomplete  MRSA PCR Screening     Status: None   Collection Time: 11/16/19  6:05 PM   Specimen: Nasal Mucosa;  Nasopharyngeal  Result Value Ref Range Status   MRSA by  PCR NEGATIVE NEGATIVE Final    Comment:        The GeneXpert MRSA Assay (FDA approved for NASAL specimens only), is one component of a comprehensive MRSA colonization surveillance program. It is not intended to diagnose MRSA infection nor to guide or monitor treatment for MRSA infections. Performed at [facility], [place] [address] [place], [place] [address]   Culture, blood (routine x 2)     Status: None (Preliminary result)   Collection Time: 11/16/19  6:08 PM   Specimen: BLOOD RIGHT HAND  Result Value Ref Range Status   Specimen Description BLOOD RIGHT HAND  Final   Special Requests   Final    BOTTLES DRAWN AEROBIC ONLY Blood Culture results may not be optimal due to an inadequate volume of blood received in culture bottles   Culture   Final    NO GROWTH < 12 HOURS Performed at [facility], [place] [address], [place], [place] [address]    Report Status PENDING  Incomplete    Coagulation Studies: Recent Labs    11/16/19 1238 11/16/19 2005  LABPROT 17.5* 19.2*  INR 1.5* 1.6*    Urinalysis: No results for input(s): COLORURINE, LABSPEC, PHURINE, GLUCOSEU, HGBUR, BILIRUBINUR, KETONESUR, PROTEINUR, UROBILINOGEN, NITRITE, LEUKOCYTESUR in the last 72 hours.  Invalid input(s): APPERANCEUR    Imaging: Ct Head Wo Contrast  Result Date: 11/16/2019 CLINICAL DATA:  Status post cardiac arrest and CPR. EXAM: CT HEAD WITHOUT CONTRAST TECHNIQUE: Contiguous axial images were obtained from the base of the skull through the vertex without intravenous contrast. COMPARISON:  Head CT 07/15/2012 FINDINGS: Brain: Streak artifact from overlying monitoring devices particularly in the left frontal region. No intracranial hemorrhage, mass effect, or midline shift. No evidence of cerebral edema. Gray-white differentiation is preserved. No hydrocephalus. The basilar cisterns are patent. No evidence of territorial infarct or acute  ischemia. No extra-axial or intracranial fluid collection. Vascular: No hyperdense vessel. Skull: No fracture or focal lesion. Sinuses/Orbits: Fluid within the nasopharynx. Fluid level in the right maxillary sinus, sphenoid sinuses, and throughout the ethmoid air cells. No acute orbital abnormality. Minimal opacification of lower mastoid air cells. Other: None. IMPRESSION: 1. No acute intracranial abnormality, allowing for mild streak artifact limitations. 2. Inflammatory changes in the paranasal sinuses, likely due to tracheostomy tube placement. Electronically Signed   By: Keith Rake M.D.   On: 11/16/2019 15:33   Ct Angio Chest Pe W Or Wo Contrast  Result Date: 11/16/2019 CLINICAL DATA:  Cardiac arrest during dialysis today. EXAM: CT ANGIOGRAPHY CHEST WITH CONTRAST TECHNIQUE: Multidetector CT imaging of the chest was performed using the standard protocol during bolus administration of intravenous contrast. Multiplanar CT image reconstructions and MIPs were obtained to evaluate the vascular anatomy. CONTRAST:  124m OMNIPAQUE IOHEXOL 350 MG/ML SOLN COMPARISON:  05/19/2017. FINDINGS: Cardiovascular: Image quality is degraded by respiratory motion, expiratory phase imaging and streak artifact from the patient's arms. These factors limit evaluation of the segmental and subsegmental pulmonary arteries. There are suspected filling defects, however, at the segmental level in the right middle lobe (7/220) and left lower lobe (7/216). No large central or lobar pulmonary embolus. Atherosclerotic calcification of the aorta and coronary arteries. Pulmonic trunk and heart are enlarged. No pericardial effusion. Mediastinum/Nodes: Air is seen in the superior mediastinum. Mediastinal lymph nodes measure up to 1.5 cm in the prevascular space, as before. No definite hilar or axillary adenopathy. Esophagus is grossly unremarkable. Lungs/Pleura: Tracheostomy is in place. Large right pleural effusion with  collapse/consolidation  in the adjacent right upper and right lower lobes. There is airspace consolidation in the left lower lobe with a rounded area of fluid density in the base of the left hemithorax, adjacent to the left hemidiaphragm, measuring 4.2 cm (5/107), new from 05/18/2017. Increased attenuation throughout the lungs is likely due to expiratory phase imaging. No pneumothorax. Trace left pleural fluid. Airway is otherwise unremarkable. Upper Abdomen: Reflux of contrast into the IVC and hepatic veins. Again, streak artifact from the patient's arms degrades image quality. Visualized portions of the liver, adrenal glands, spleen, pancreas and stomach are grossly unremarkable. Perihepatic and perisplenic ascites. Musculoskeletal: Acute anterior rib fractures bilaterally, related to cardiopulmonary resuscitation. Subcutaneous emphysema is seen in the left neck and upper left chest. Old posterior right rib fractures. Bones appear dense, indicative of renal osteodystrophy. Review of the MIP images confirms the above findings. IMPRESSION: 1. Assessment for pulmonary emboli is fairly compromised by respiratory motion, expiratory phase imaging and streak artifact from the patient's arms. There are segmental low-attenuation filling defects associated with the right middle and left lower lobe pulmonary arteries, however, suspicious for pulmonary emboli. No large central or lobar pulmonary embolus. 2. Pneumomediastinum and subcutaneous emphysema with bilateral anterior rib fractures related to cardiopulmonary resuscitation. No pneumothorax. 3. Large right pleural effusion. New loculated collection of fluid at the base of the left hemithorax may be within the left pleural space. A new small diaphragmatic defect on the left cannot be excluded, given aggressive cardiopulmonary resuscitation. 4. Collapse/consolidation in the right middle and right lower lobes. Additional airspace consolidation in the left lower lobe.  Findings can be seen with aspiration. 5. Mediastinal adenopathy may be reactive. Difficult to exclude a lymphoproliferative disorder. 6. Ascites. 7. Aortic atherosclerosis (ICD10-170.0). Coronary artery calcification. 8. Enlarged pulmonic trunk, indicative of pulmonary arterial hypertension. Electronically Signed   By: Lorin Picket M.D.   On: 11/16/2019 16:08   Dg Chest Port 1 View  Result Date: 11/17/2019 CLINICAL DATA:  Respiratory failure. EXAM: PORTABLE CHEST 1 VIEW COMPARISON:  11/16/2019 FINDINGS: Tracheostomy tube and central venous catheter appear in good position, unchanged. Persistent cardiomegaly. The bilateral pulmonary infiltrates have improved. However, there is increased density at the lung bases, likely representing increased effusions. No other change. IMPRESSION: 1. Increased bilateral pleural effusions. 2. Improved bilateral pulmonary infiltrates. 3. No other change. 4. Stable cardiomegaly. Electronically Signed   By: Lorriane Shire M.D.   On: 11/17/2019 12:10   Dg Chest Port 1 View  Result Date: 11/16/2019 CLINICAL DATA:  Central line insertion. Pulmonary infiltrates. EXAM: PORTABLE CHEST 1 VIEW 6:28 p.m. COMPARISON:  Chest x-ray 11/16/2019 at 10:57 a.m. FINDINGS: Tracheostomy tube in good position. New right central venous catheter tip is in the superior vena cava approximately 3 cm below the carina in good position. The bilateral pulmonary infiltrates have progressed particularly in the right upper and mid lung zone. Cardiomegaly. No acute bone abnormality. IMPRESSION: 1. New right central venous catheter is in good position. 2. Progressive bilateral pulmonary infiltrates, particularly in the right upper and mid lung zone. 3. No pneumothorax. Electronically Signed   By: Lorriane Shire M.D.   On: 11/16/2019 18:40   Dg Chest Portable 1 View  Result Date: 11/16/2019 CLINICAL DATA:  Post CPR.  Tracheostomy tube. EXAM: PORTABLE CHEST 1 VIEW COMPARISON:  Radiographs 10/26/2018 and  12/31/2017. FINDINGS: 1057 hours. Tip of the tracheostomy tube is just below the thoracic inlet. The heart is enlarged. There is vascular congestion with increasing right greater than left perihilar opacities, probably  reflecting edema. There is no confluent airspace opacity, pleural effusion or pneumothorax. No acute fractures are seen. IMPRESSION: 1. Worsened right greater than left perihilar opacities most consistent with edema (versus atypical inflammation). 2. Tip of the tracheostomy tube in the proximal trachea, just below the thoracic inlet. Electronically Signed   By: Richardean Sale M.D.   On: 11/16/2019 11:16     Medications:   . sodium chloride Stopped (11/17/19 0836)  . cisatracurium (NIMBEX) infusion 1.5 mcg/kg/min (11/18/19 0200)  . fentaNYL infusion INTRAVENOUS 100 mcg/hr (11/18/19 0200)  . heparin 950 Units/hr (11/18/19 0458)  . midazolam 8 mg/hr (11/18/19 0503)  . norepinephrine (LEVOPHED) Adult infusion 10 mcg/min (11/18/19 0200)   . artificial tears  1 application Both Eyes D4X  . chlorhexidine gluconate (MEDLINE KIT)  15 mL Mouth Rinse BID  . Chlorhexidine Gluconate Cloth  6 each Topical Daily  . fentaNYL (SUBLIMAZE) injection  100 mcg Intravenous Once  . mouth rinse  15 mL Mouth Rinse 10 times per day  . midazolam  2 mg Intravenous Once  . pantoprazole (PROTONIX) IV  40 mg Intravenous QHS  . Thrombi-Pad  1 each Topical Once   acetaminophen, [COMPLETED] cisatracurium **AND** cisatracurium (NIMBEX) infusion **AND** cisatracurium, fentaNYL, midazolam   HD Rx: [facility] MWF, 4h 34mn; 2/2.5 bath; 450/1.5; F180; UFP 4; IVB Hep 5 k qTx; C3 0.75 qTx; no ESA/Fe; Hb 12s usually  Assessment/ Plan:   [facility]Monday Wednesday [facility]  Patient will need placement of temporary dialysis catheter and initiation of CRRT.  Will write orders for CRRT today  Status post PEA arrest.  Appreciate assistance from Dr. [id number] 2 at hemodialysis prolonged  period of ROSC.  11/17/2019 2D echo less than 20% ejection fraction.  New onset atrial fibrillation CHA2DS2-VASc score 3 IV heparin and IV amiodarone  Chronic combined congestive heart failure EF 20% not a candidate for ICD due to noncompliance and drug abuse  Prolonged QT interval  Cardiogenic shock hypotensive pressor support  ANEMIA-no major issue  MBD-severe chronic hyperphosphatemia  ACCESS-AV fistula  Diffuse nonspecific encephalopathy EEG performed appreciate assistance from Dr. YHortense Ramal Emergent cricothyroidotomy  acute respiratory failure   History of polysubstance abuse   LOS: 2 MSherril [email or web address] @8 :02 AM

## 2019-11-18 NOTE — Progress Notes (Signed)
ANTICOAGULATION CONSULT NOTE   Pharmacy Consult for Heparin Indication: atrial fibrillation and pulmonary embolus  Patient Measurements: Height: 5' 10.5" (179.1 cm) Weight: 176 lb 5.9 oz (80 kg) IBW/kg (Calculated) : 74.15  Vital Signs: Temp: 94.6 F (34.8 C) (12/04 0200) Temp Source: Rectal (12/04 0000) BP: 76/59 (12/04 0200) Pulse Rate: 91 (12/03 2339)  Labs: Recent Labs    11/16/19 1116 11/16/19 1141 11/16/19 1238  11/16/19 1730  11/16/19 2005  11/17/19 0500 11/17/19 0534  11/17/19 1219 11/17/19 1300 11/17/19 1813 11/17/19 2000 11/18/19 0305  HGB 13.1  --  12.4*   < >  --    < >  --    < > 12.2* 12.9*  --  13.6  --  12.6*  --   --   HCT 42.5  --  34.0*   < >  --    < >  --    < > 37.0* 38.0*  --  40.0  --  37.0*  --   --   PLT 170  --  178  --   --   --   --   --  125*  --   --   --   --   --   --   --   APTT  --   --  37*  --   --   --  51*  --   --   --   --   --   --   --   --   --   LABPROT  --   --  17.5*  --   --   --  19.2*  --   --   --   --   --   --   --   --   --   INR  --   --  1.5*  --   --   --  1.6*  --   --   --   --   --   --   --   --   --   HEPARINUNFRC  --   --   --   --   --   --   --   --   --   --    < >  --  0.28*  --  0.24* 0.18*  CREATININE 7.01*  --   --   --   --    < >  --    < > 8.39*  --   --  8.50*  --  8.70*  --   --   TROPONINIHS  --  171* 243*  --  679*  --   --   --   --   --   --   --   --   --   --   --    < > = values in this interval not displayed.    Estimated Creatinine Clearance: 10.5 mL/min (A) (by C-G formula based on SCr of 8.7 mg/dL (H)).  Assessment: 53 yo male with new onset Afib and possible PE for heparin  Goal of Therapy:  Heparin level 0.3-0.7 units/ml Monitor platelets by anticoagulation protocol: Yes   Plan: Increase Heparin 950 units/hr Check heparin level in 8 hours.  Phillis Knack, PharmD, BCPS  11/18/2019 3:30 AM ,

## 2019-11-18 NOTE — Progress Notes (Signed)
Initial Nutrition Assessment  DOCUMENTATION CODES:   Not applicable  INTERVENTION:   Tube Feeding Vital AF 1.2 at 55 ml/hr Pro-Stat 30 mL TID Provides 144 g of protein, 1884 kcals and 1069 mL of free water Meets 100% estimated protein and calorie needs  Add B-complex with Vitamin C; switch to Rena-Vit once able to take po meds   NUTRITION DIAGNOSIS:   Inadequate oral intake related to acute illness as evidenced by NPO status.  GOAL:   Patient will meet greater than or equal to 90% of their needs  MONITOR:   Vent status, Labs, Weight trends, TF tolerance  REASON FOR ASSESSMENT:   Ventilator    ASSESSMENT:   53 yo male admitted post cardiac arrest at [facility], required emergent cricothyrotomy in the field. PMH includes ESRD on HD, HTN, CHF, cocaine and EtOH abuse, medical nonadherence   RD working remotely.  12/02 Admit post Cardiac Arrest, TTM 33 degrees 12/04 Gastric Cortrak placed, CRRT initiated  ENT consulted for possible conversion to formal trach Patient is currently on ventilator support, off paralytic, fentanyl and versed for sedation, on levophed MV: 7.9 L/min Temp (24hrs), Avg:94.2 F (34.6 C), Min:89.8 F (32.1 C), Max:99 F (37.2 C)  Cortak placed  Unable to obtain diet and weight history  Current wt 80 kg; unsure of EDW  Hypoglycemic; hopefully will improve with TF initiation  Monitor electrolytes and makes formula adjustments as needed  Labs: CBGs 39-110; phosphorus 5.9, potassium wdl Meds: reviewed   Diet Order:   Diet Order    None      EDUCATION NEEDS:   Not appropriate for education at this time  Skin:  Skin Assessment: Reviewed RN Assessment  Last BM:  no documented BM  Height:   Ht Readings from Last 1 Encounters:  11/16/19 5' 10.5" (1.791 m)    Weight:   Wt Readings from Last 1 Encounters:  11/16/19 80 kg    Ideal Body Weight:     BMI:  Body mass index is 24.95 kg/m.  Estimated Nutritional Needs:   Kcal:   1851 kcals  Protein:  120-160 g  Fluid:  1000 mL plus UOP    [employer] MS, RDN, LDN, CNSC [phone] Pager  [phone] Weekend/On-Call Pager,

## 2019-11-18 NOTE — H&P (View-Only) (Signed)
Reason for Consult: Management of airway Referring Physician: Kipp Brood, MD  [name] [name] is an 53 y.o. male.  HPI: Patient admitted yesterday after a cardiac arrest and emergent cricothyrotomy in the field.  He is stable on ventilatory support and the plan for now is to evaluate his neurologic situation.  There may be a need for conversion to a tracheostomy.  Past Medical History:  Diagnosis Date  . Abdominal distension 08/10/2018  . Alcohol abuse 11/26/2010   Qualifier: Diagnosis of  By: [name] MD, Pershing Proud  . Anemia due to blood loss, acute 07/15/2012  . CHF (congestive heart failure) ([place])   . Cholecystitis 04/18/2014  . Cigarette smoker    has currently quit  . Cocaine abuse ([place]) 11/26/2010   Qualifier: Diagnosis of  By: [name] MD, Benjamine Mola  has been over a year  . 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 Echo 8/13: Severe LVH, EF 40-45%, inferoposterior akinesis, grade 2 diastolic dysfunction, moderate LAE, mild RVE, mildly reduced RVSF, mild RAE; cannot rule out R atrial mass-suggest TEE or cardiac MRI  //  B. Echo 3/17: Mild LVH, EF 25-30%, diffuse HK, grade 2 diastolic dysfunction, mild MR, severe LAE,  moderately reduced RVSF, severe RAE, mild TR, moderate PI, PASP 55 mmHg    . Dyspnea   . ESRD (end stage renal disease) on dialysis [facility])    MWF- [place] (05/18/2017)  . Hemodialysis patient ([place])    M,W,F  . Hernia, inguinal, right 08/05/2016  . Hypertension   . 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 Hypocalcemia 07/15/2012  . Medical non-compliance   . Non-ischemic cardiomyopathy ([place])    A. R/L HC 3/17: Normal coronary arteries, moderate pulmonary hypertension (PASP 65 mmHg), elevated LV filling pressures (LVEDP 45 mmHg)   . NSVT (nonsustained ventricular tachycardia) ([place]) 02/22/2016  . NSVT  (nonsustained ventricular tachycardia) ([place]) 02/22/2016  . Pneumonia 11/12/2017  . Polysubstance abuse ([place])   . Prolonged Q-T interval on ECG 05/18/2017  . Prolonged QT interval 05/18/2017  . Restless leg syndrome   . Solitary pulmonary nodule 10/10/2015   See cxr 10/09/2015 - CT rec 10/10/2015 >>>   . Upper airway cough syndrome 10/09/2015   Off ACEi around 1st Oct 2016  - Sinus CT 10/10/2015 >>>      Past Surgical History:  Procedure Laterality Date  . [place] TRANSPOSITION  01/19/2013   Procedure: BASCILIC VEIN TRANSPOSITION;  Surgeon: Conrad Bertha, MD;  Location: [place];  Service: Vascular;  Laterality: Left;  left 2nd stage basilic vein transposition  . CARDIAC CATHETERIZATION N/A 02/25/2016   Procedure: Right/Left Heart Cath and Coronary Angiography;  Surgeon: Burnell Blanks, MD;  Location: [place] CV LAB;  Service: Cardiovascular;  Laterality: N/A;  . INGUINAL HERNIA REPAIR Right 08/05/2016   Procedure: RIGHT INGUINAL HERNIA REPAIR WITH MESH;  Surgeon: Donnie Mesa, MD;  Location: [place];  Service: General;  Laterality: Right;  . INSERTION OF DIALYSIS CATHETER  07/17/2012   Procedure: INSERTION OF DIALYSIS CATHETER;  Surgeon: Conrad Aguilar, MD;  Location: [place];  Service: Vascular;  Laterality: Right;  right internal jugular  . INSERTION OF MESH Right 08/05/2016   Procedure: INSERTION OF MESH;  Surgeon: Donnie Mesa, MD;  Location: [place];  Service: General;  Laterality: Right;  . REVISION OF ARTERIOVENOUS GORETEX GRAFT Left 08/02/2019  Procedure: PLICATION OF ARTERIOVENOUS FISTULA LEFT ARM;  Surgeon: Elam Dutch, MD;  Location: [place];  Service: Vascular;  Laterality: Left;  . REVISON OF ARTERIOVENOUS FISTULA Left 01/06/2017   Procedure: REVISON OF ARTERIOVENOUS FISTULA;  Surgeon: Conrad [place], MD;  Location: [place];  Service: Vascular;  Laterality: Left;  . REVISON OF ARTERIOVENOUS FISTULA Left 10/27/2018   Procedure: REVISION PLICATION OF ARTERIOVENOUS FISTULA  LEFT  UPPER ARM;  Surgeon: Waynetta [place], MD;  Location: [place];  Service: Vascular;  Laterality: Left;  . UMBILICAL HERNIA REPAIR N/A 08/05/2016   Procedure: [place];  Surgeon: Donnie Mesa, MD;  Location: [place];  Service: General;  Laterality: N/A;  . VENOGRAM N/A 08/09/2012   Procedure: VENOGRAM;  Surgeon: Conrad Falconaire, MD;  [facility];  Service: Cardiovascular;  Laterality: N/A;    Family History  Problem Relation Age of Onset  . Hypertension Mother   . Diabetes Mother   . Renal Disease Mother   . Hypertension Father     Social History:  reports that he quit smoking about 12 months ago. His smoking use included cigarettes. He quit after 30.00 years of use. He has never used smokeless tobacco. He reports current alcohol use of about 4.0 standard drinks of alcohol per week. He reports that he does not use drugs.  Allergies:  Allergies  Allergen Reactions  . Losartan Cough    Medications: Reviewed  Results for orders placed or performed during the hospital encounter of 11/16/19 (from the past 48 hour(s))  I-STAT 7, (LYTES, BLD GAS, ICA, H+H)     Status: Abnormal   Collection Time: 11/16/19  1:10 PM  Result Value Ref Range   pH, Arterial 7.434 7.350 - 7.450   pCO2 arterial 41.9 32.0 - 48.0 mmHg   pO2, Arterial 185.0 (H) 83.0 - 108.0 mmHg   Bicarbonate 28.1 (H) 20.0 - 28.0 mmol/L   TCO2 29 22 - 32 mmol/L   O2 Saturation 100.0 %   Acid-Base Excess 3.0 (H) 0.0 - 2.0 mmol/L   Sodium 138 135 - 145 mmol/L   Potassium 3.8 3.5 - 5.1 mmol/L   Calcium, Ion 1.21 1.15 - 1.40 mmol/L   HCT 39.0 39.0 - 52.0 %   Hemoglobin 13.3 13.0 - 17.0 g/dL   Patient temperature 98.4 F    Sample type ARTERIAL   CBG monitoring, ED     Status: Abnormal   Collection Time: 11/16/19  1:37 PM  Result Value Ref Range   Glucose-Capillary 134 (H) 70 - 99 mg/dL   Comment 1 Notify RN    Comment 2 Document in Chart   Lactic acid, plasma     Status: Abnormal    Collection Time: 11/16/19  1:46 PM  Result Value Ref Range   Lactic Acid, Venous 4.4 (HH) 0.5 - 1.9 mmol/L    Comment: CRITICAL VALUE NOTED.  VALUE IS CONSISTENT WITH PREVIOUSLY REPORTED AND CALLED VALUE. Performed at [facility], [place] [address] [place], [place] [address]   Brain natriuretic peptide     Status: Abnormal   Collection Time: 11/16/19  1:56 PM  Result Value Ref Range   B Natriuretic Peptide 962.8 (H) 0.0 - 100.0 pg/mL    Comment: Performed at [facility] [address] [place] [address], [place], [place] [address]  Procalcitonin - Baseline     Status: None   Collection Time: 11/16/19  1:57 PM  Result Value Ref Range   Procalcitonin 0.69  ng/mL    Comment:        Interpretation: PCT > 0.5 ng/mL and <= 2 ng/mL: Systemic infection (sepsis) is possible, but other conditions are known to elevate PCT as well. (NOTE)       Sepsis PCT Algorithm           Lower Respiratory Tract                                      Infection PCT Algorithm    ----------------------------     ----------------------------         PCT < 0.25 ng/mL                PCT < 0.10 ng/mL         Strongly encourage             Strongly discourage   discontinuation of antibiotics    initiation of antibiotics    ----------------------------     -----------------------------       PCT 0.25 - 0.50 ng/mL            PCT 0.10 - 0.25 ng/mL               OR       >80% decrease in PCT            Discourage initiation of                                            antibiotics      Encourage discontinuation           of antibiotics    ----------------------------     -----------------------------         PCT >= 0.50 ng/mL              PCT 0.26 - 0.50 ng/mL                AND       <80% decrease in PCT             Encourage initiation of                                             antibiotics       Encourage continuation           of antibiotics    ----------------------------     -----------------------------         PCT >= 0.50 ng/mL                  PCT > 0.50 ng/mL               AND         increase in PCT                  Strongly encourage                                      initiation of antibiotics    Strongly encourage escalation  of antibiotics                                     -----------------------------                                           PCT <= 0.25 ng/mL                                                 OR                                        > 80% decrease in PCT                                     Discontinue / Do not initiate                                             antibiotics Performed at [facility], [place] [address], [place], [place] [address]   TSH     Status: Abnormal   Collection Time: 11/16/19  5:30 PM  Result Value Ref Range   TSH 7.112 (H) 0.350 - 4.500 uIU/mL    Comment: Performed by a 3rd Generation assay with a functional sensitivity of <=0.01 uIU/mL. Performed at [facility], [place] [address], [place], [place] [address]   Troponin I (High Sensitivity)     Status: Abnormal   Collection Time: 11/16/19  5:30 PM  Result Value Ref Range   Troponin I (High Sensitivity) 679 (HH) <18 ng/L    Comment: CRITICAL VALUE NOTED.  VALUE IS CONSISTENT WITH PREVIOUSLY REPORTED AND CALLED VALUE. (NOTE) Elevated high sensitivity troponin I (hsTnI) values and significant  changes across serial measurements may suggest ACS but many other  chronic and acute conditions are known to elevate hsTnI results.  Refer to the Links section for chest pain algorithms and additional  guidance. Performed at [facility], [place] [address], [place], [place] [address]   Ethanol     Status: None   Collection Time: 11/16/19  5:30 PM  Result Value Ref Range   Alcohol, Ethyl (B) <10 <10 mg/dL    Comment: (NOTE) Lowest detectable limit for serum alcohol is 10 mg/dL. For medical purposes only. Performed at [facility], [place] [address] [place],  [place] [address]   Magnesium     Status: None   Collection Time: 11/16/19  5:30 PM  Result Value Ref Range   Magnesium 1.9 1.7 - 2.4 mg/dL    Comment: Performed at [facility] [address], [place], [place] [address]  Phosphorus     Status: Abnormal   Collection Time: 11/16/19  5:30 PM  Result Value Ref Range   Phosphorus 5.9 (H) 2.5 - 4.6 mg/dL    Comment: Performed at [facility]  [address] [place], [place] [address]  Glucose, capillary     Status: Abnormal   Collection Time: 11/16/19  5:39 PM  Result Value Ref Range   Glucose-Capillary 119 (H) 70 - 99 mg/dL  Lactic acid, plasma     Status: None   Collection Time: 11/16/19  5:40 PM  Result Value Ref Range   Lactic Acid, Venous 1.2 0.5 - 1.9 mmol/L    Comment: Performed at [facility] [address], [place], [place] [address]  I-STAT 7, (LYTES, BLD GAS, ICA, H+H)     Status: Abnormal   Collection Time: 11/16/19  5:41 PM  Result Value Ref Range   pH, Arterial 7.400 7.350 - 7.450   pCO2 arterial 42.1 32.0 - 48.0 mmHg   pO2, Arterial 75.0 (L) 83.0 - 108.0 mmHg   Bicarbonate 26.4 20.0 - 28.0 mmol/L   TCO2 28 22 - 32 mmol/L   O2 Saturation 96.0 %   Acid-Base Excess 1.0 0.0 - 2.0 mmol/L   Sodium 137 135 - 145 mmol/L   Potassium 3.5 3.5 - 5.1 mmol/L   Calcium, Ion 1.19 1.15 - 1.40 mmol/L   HCT 41.0 39.0 - 52.0 %   Hemoglobin 13.9 13.0 - 17.0 g/dL   Patient temperature 35.7 C    Collection site ARTERIAL LINE    Drawn by Nurse    Sample type ARTERIAL   I-STAT, chem 8     Status: Abnormal   Collection Time: 11/16/19  5:47 PM  Result Value Ref Range   Sodium 139 135 - 145 mmol/L   Potassium 3.6 3.5 - 5.1 mmol/L   Chloride 98 98 - 111 mmol/L   BUN 32 (H) 6 - 20 mg/dL   Creatinine, Ser 8.00 (H) 0.61 - 1.24 mg/dL   Glucose, Bld 125 (H) 70 - 99 mg/dL   Calcium, Ion 1.19 1.15 - 1.40 mmol/L   TCO2 27 22 - 32 mmol/L   Hemoglobin 13.6 13.0 - 17.0 g/dL   HCT 40.0 39.0 - 52.0 %  Culture, blood (routine x 2)      Status: None (Preliminary result)   Collection Time: 11/16/19  6:03 PM   Specimen: BLOOD RIGHT HAND  Result Value Ref Range   Specimen Description BLOOD RIGHT HAND    Special Requests      BOTTLES DRAWN AEROBIC AND ANAEROBIC Blood Culture results may not be optimal due to an inadequate volume of blood received in culture bottles   Culture      NO GROWTH 2 DAYS Performed at [facility], [place] [address] [place], [place] [address]    Report Status PENDING   MRSA PCR Screening     Status: None   Collection Time: 11/16/19  6:05 PM   Specimen: Nasal Mucosa; Nasopharyngeal  Result Value Ref Range   MRSA by PCR NEGATIVE NEGATIVE    Comment:        The GeneXpert MRSA Assay (FDA approved for NASAL specimens only), is one component of a comprehensive MRSA colonization surveillance program. It is not intended to diagnose MRSA infection nor to guide or monitor treatment for MRSA infections. Performed at [facility], [place] [address] [place],  [address]   Culture, blood (routine x 2)     Status: None (Preliminary result)   Collection Time: 11/16/19  6:08 PM   Specimen: BLOOD RIGHT HAND  Result Value Ref Range   Specimen Description BLOOD RIGHT HAND    Special Requests  BOTTLES DRAWN AEROBIC ONLY Blood Culture results may not be optimal due to an inadequate volume of blood received in culture bottles   Culture      NO GROWTH 2 DAYS Performed at [facility] [address], [place], [place] [address]    Report Status PENDING   Glucose, capillary     Status: Abnormal   Collection Time: 11/16/19  7:05 PM  Result Value Ref Range   Glucose-Capillary 146 (H) 70 - 99 mg/dL  Glucose, capillary     Status: Abnormal   Collection Time: 11/16/19  8:01 PM  Result Value Ref Range   Glucose-Capillary 165 (H) 70 - 99 mg/dL  I-STAT, chem 8     Status: Abnormal   Collection Time: 11/16/19  8:04 PM  Result Value Ref Range   Sodium 138 135 - 145 mmol/L   Potassium  3.5 3.5 - 5.1 mmol/L   Chloride 98 98 - 111 mmol/L   BUN 33 (H) 6 - 20 mg/dL   Creatinine, Ser 8.00 (H) 0.61 - 1.24 mg/dL   Glucose, Bld 167 (H) 70 - 99 mg/dL   Calcium, Ion 1.20 1.15 - 1.40 mmol/L   TCO2 23 22 - 32 mmol/L   Hemoglobin 13.6 13.0 - 17.0 g/dL   HCT 40.0 39.0 - 52.0 %  Protime-INR now and repeat in 8 hours     Status: Abnormal   Collection Time: 11/16/19  8:05 PM  Result Value Ref Range   Prothrombin Time 19.2 (H) 11.4 - 15.2 seconds   INR 1.6 (H) 0.8 - 1.2    Comment: (NOTE) INR goal varies based on device and disease states. Performed at [facility], [place] [address] [place], [place] [address]   APTT now and repeat in 8 hours     Status: Abnormal   Collection Time: 11/16/19  8:05 PM  Result Value Ref Range   aPTT 51 (H) 24 - 36 seconds    Comment:        IF BASELINE aPTT IS ELEVATED, SUGGEST PATIENT RISK ASSESSMENT BE USED TO DETERMINE APPROPRIATE ANTICOAGULANT THERAPY. Performed at [facility], [place] [address], [place], [place] [address]   Lactic acid, plasma     Status: None   Collection Time: 11/16/19  8:05 PM  Result Value Ref Range   Lactic Acid, Venous 1.3 0.5 - 1.9 mmol/L    Comment: Performed at [facility] [address], [place], [place] [address]  Glucose, capillary     Status: Abnormal   Collection Time: 11/16/19  9:05 PM  Result Value Ref Range   Glucose-Capillary 152 (H) 70 - 99 mg/dL  Glucose, capillary     Status: Abnormal   Collection Time: 11/16/19  9:45 PM  Result Value Ref Range   Glucose-Capillary 147 (H) 70 - 99 mg/dL  I-STAT, chem 8     Status: Abnormal   Collection Time: 11/16/19  9:48 PM  Result Value Ref Range   Sodium 138 135 - 145 mmol/L   Potassium 3.6 3.5 - 5.1 mmol/L   Chloride 99 98 - 111 mmol/L   BUN 32 (H) 6 - 20 mg/dL   Creatinine, Ser 8.10 (H) 0.61 - 1.24 mg/dL   Glucose, Bld 143 (H) 70 - 99 mg/dL   Calcium, Ion 1.15 1.15 - 1.40 mmol/L   TCO2 23 22 - 32 mmol/L   Hemoglobin 13.3 13.0 - 17.0  g/dL   HCT 39.0 39.0 - 52.0 %  Glucose,  capillary     Status: Abnormal   Collection Time: 11/16/19 11:03 PM  Result Value Ref Range   Glucose-Capillary 137 (H) 70 - 99 mg/dL  Glucose, capillary     Status: Abnormal   Collection Time: 11/17/19 12:06 AM  Result Value Ref Range   Glucose-Capillary 128 (H) 70 - 99 mg/dL  I-STAT, chem 8     Status: Abnormal   Collection Time: 11/17/19 12:10 AM  Result Value Ref Range   Sodium 138 135 - 145 mmol/L   Potassium 3.6 3.5 - 5.1 mmol/L   Chloride 102 98 - 111 mmol/L   BUN 32 (H) 6 - 20 mg/dL   Creatinine, Ser 8.80 (H) 0.61 - 1.24 mg/dL   Glucose, Bld 127 (H) 70 - 99 mg/dL   Calcium, Ion 1.06 (L) 1.15 - 1.40 mmol/L   TCO2 24 22 - 32 mmol/L   Hemoglobin 13.3 13.0 - 17.0 g/dL   HCT 39.0 39.0 - 52.0 %  Glucose, capillary     Status: Abnormal   Collection Time: 11/17/19  1:16 AM  Result Value Ref Range   Glucose-Capillary 120 (H) 70 - 99 mg/dL  Glucose, capillary     Status: Abnormal   Collection Time: 11/17/19  2:14 AM  Result Value Ref Range   Glucose-Capillary 122 (H) 70 - 99 mg/dL  Glucose, capillary     Status: Abnormal   Collection Time: 11/17/19  3:20 AM  Result Value Ref Range   Glucose-Capillary 119 (H) 70 - 99 mg/dL  Glucose, capillary     Status: None   Collection Time: 11/17/19  4:34 AM  Result Value Ref Range   Glucose-Capillary 98 70 - 99 mg/dL  CBC     Status: Abnormal   Collection Time: 11/17/19  5:00 AM  Result Value Ref Range   WBC 11.1 (H) 4.0 - 10.5 K/uL   RBC 3.98 (L) 4.22 - 5.81 MIL/uL   Hemoglobin 12.2 (L) 13.0 - 17.0 g/dL   HCT 37.0 (L) 39.0 - 52.0 %   MCV 93.0 80.0 - 100.0 fL   MCH 30.7 26.0 - 34.0 pg   MCHC 33.0 30.0 - 36.0 g/dL   RDW 15.7 (H) 11.5 - 15.5 %   Platelets 125 (L) 150 - 400 K/uL   nRBC 0.0 0.0 - 0.2 %    Comment: Performed at [facility], [address] [place], [place] [address]  Basic metabolic panel     Status: Abnormal   Collection Time: 11/17/19  5:00 AM  Result Value Ref Range    Sodium 136 135 - 145 mmol/L   Potassium 4.0 3.5 - 5.1 mmol/L   Chloride 100 98 - 111 mmol/L   CO2 21 (L) 22 - 32 mmol/L   Glucose, Bld 110 (H) 70 - 99 mg/dL   BUN 37 (H) 6 - 20 mg/dL   Creatinine, Ser 8.39 (H) 0.61 - 1.24 mg/dL   Calcium 8.4 (L) 8.9 - 10.3 mg/dL   GFR calc non Af Amer 7 (L) >60 mL/min   GFR calc Af Amer 8 (L) >60 mL/min   Anion gap 15 5 - 15    Comment: Performed at [facility] [address] [place], [place] [address]  Magnesium     Status: None   Collection Time: 11/17/19  5:00 AM  Result Value Ref Range   Magnesium 2.0 1.7 - 2.4 mg/dL    Comment: Performed at [facility], [place] [address] [place], [place]  [address]  Phosphorus     Status: Abnormal   Collection Time: 11/17/19  5:00 AM  Result Value Ref Range   Phosphorus 5.2 (H) 2.5 - 4.6 mg/dL    Comment: Performed at [facility] [address] [place], [place] [address]  Procalcitonin     Status: None   Collection Time: 11/17/19  5:00 AM  Result Value Ref Range   Procalcitonin 24.16 ng/mL    Comment:        Interpretation: PCT >= 10 ng/mL: Important systemic inflammatory response, almost exclusively due to severe bacterial sepsis or septic shock. (NOTE)       Sepsis PCT Algorithm           Lower Respiratory Tract                                      Infection PCT Algorithm    ----------------------------     ----------------------------         PCT < 0.25 ng/mL                PCT < 0.10 ng/mL         Strongly encourage             Strongly discourage   discontinuation of antibiotics    initiation of antibiotics    ----------------------------     -----------------------------       PCT 0.25 - 0.50 ng/mL            PCT 0.10 - 0.25 ng/mL               OR       >80% decrease in PCT            Discourage initiation of                                            antibiotics      Encourage discontinuation           of antibiotics    ----------------------------      -----------------------------         PCT >= 0.50 ng/mL              PCT 0.26 - 0.50 ng/mL                AND       <80% decrease in PCT             Encourage initiation of                                             antibiotics       Encourage continuation           of antibiotics    ----------------------------     -----------------------------        PCT >= 0.50 ng/mL                  PCT > 0.50 ng/mL               AND         increase in PCT  Strongly encourage                                      initiation of antibiotics    Strongly encourage escalation           of antibiotics                                     -----------------------------                                           PCT <= 0.25 ng/mL                                                 OR                                        > 80% decrease in PCT                                     Discontinue / Do not initiate                                             antibiotics Performed at [facility], [place] [address] [place], [place] [address]   Glucose, capillary     Status: Abnormal   Collection Time: 11/17/19  5:31 AM  Result Value Ref Range   Glucose-Capillary 104 (H) 70 - 99 mg/dL  I-STAT 7, (LYTES, BLD GAS, ICA, H+H)     Status: Abnormal   Collection Time: 11/17/19  5:34 AM  Result Value Ref Range   pH, Arterial 7.474 (H) 7.350 - 7.450   pCO2 arterial 30.4 (L) 32.0 - 48.0 mmHg   pO2, Arterial 170.0 (H) 83.0 - 108.0 mmHg   Bicarbonate 22.4 20.0 - 28.0 mmol/L   TCO2 23 22 - 32 mmol/L   O2 Saturation 100.0 %   Sodium 137 135 - 145 mmol/L   Potassium 3.8 3.5 - 5.1 mmol/L   Calcium, Ion 1.06 (L) 1.15 - 1.40 mmol/L   HCT 38.0 (L) 39.0 - 52.0 %   Hemoglobin 12.9 (L) 13.0 - 17.0 g/dL   Patient temperature HIDE    Sample type ARTERIAL   Heparin level (unfractionated)     Status: Abnormal   Collection Time: 11/17/19  5:41 AM  Result Value Ref Range   Heparin Unfractionated 0.12 (L) 0.30 -  0.70 IU/mL    Comment: (NOTE) If heparin results are below expected values, and patient dosage has  been confirmed, suggest follow up testing of antithrombin III levels. Performed at [facility], [place] [address] [place], [place] [address]   Glucose, capillary     Status: Abnormal   Collection Time: 11/17/19  6:05 AM  Result Value Ref Range  Glucose-Capillary 103 (H) 70 - 99 mg/dL  Glucose, capillary     Status: None   Collection Time: 11/17/19  6:55 AM  Result Value Ref Range   Glucose-Capillary 96 70 - 99 mg/dL  Glucose, capillary     Status: None   Collection Time: 11/17/19  9:14 AM  Result Value Ref Range   Glucose-Capillary 97 70 - 99 mg/dL  Glucose, capillary     Status: None   Collection Time: 11/17/19 11:28 AM  Result Value Ref Range   Glucose-Capillary 96 70 - 99 mg/dL  I-STAT, chem 8     Status: Abnormal   Collection Time: 11/17/19 12:19 PM  Result Value Ref Range   Sodium 137 135 - 145 mmol/L   Potassium 4.3 3.5 - 5.1 mmol/L   Chloride 101 98 - 111 mmol/L   BUN 36 (H) 6 - 20 mg/dL   Creatinine, Ser 8.50 (H) 0.61 - 1.24 mg/dL   Glucose, Bld 97 70 - 99 mg/dL   Calcium, Ion 1.08 (L) 1.15 - 1.40 mmol/L   TCO2 22 22 - 32 mmol/L   Hemoglobin 13.6 13.0 - 17.0 g/dL   HCT 40.0 39.0 - 52.0 %  Heparin level (unfractionated)     Status: Abnormal   Collection Time: 11/17/19  1:00 PM  Result Value Ref Range   Heparin Unfractionated 0.28 (L) 0.30 - 0.70 IU/mL    Comment: (NOTE) If heparin results are below expected values, and patient dosage has  been confirmed, suggest follow up testing of antithrombin III levels. Performed at [facility], [place] [address] [place], [place] [address]   Glucose, capillary     Status: Abnormal   Collection Time: 11/17/19  1:03 PM  Result Value Ref Range   Glucose-Capillary 104 (H) 70 - 99 mg/dL  Glucose, capillary     Status: None   Collection Time: 11/17/19  3:10 PM  Result Value Ref Range   Glucose-Capillary 90 70 - 99  mg/dL  Glucose, capillary     Status: Abnormal   Collection Time: 11/17/19  5:07 PM  Result Value Ref Range   Glucose-Capillary 65 (L) 70 - 99 mg/dL  I-STAT, chem 8     Status: Abnormal   Collection Time: 11/17/19  6:13 PM  Result Value Ref Range   Sodium 137 135 - 145 mmol/L   Potassium 4.1 3.5 - 5.1 mmol/L   Chloride 100 98 - 111 mmol/L   BUN 35 (H) 6 - 20 mg/dL   Creatinine, Ser 8.70 (H) 0.61 - 1.24 mg/dL   Glucose, Bld 134 (H) 70 - 99 mg/dL   Calcium, Ion 1.08 (L) 1.15 - 1.40 mmol/L   TCO2 22 22 - 32 mmol/L   Hemoglobin 12.6 (L) 13.0 - 17.0 g/dL   HCT 37.0 (L) 39.0 - 52.0 %  Glucose, capillary     Status: None   Collection Time: 11/17/19  7:52 PM  Result Value Ref Range   Glucose-Capillary 82 70 - 99 mg/dL  Heparin level (unfractionated)     Status: Abnormal   Collection Time: 11/17/19  8:00 PM  Result Value Ref Range   Heparin Unfractionated 0.24 (L) 0.30 - 0.70 IU/mL    Comment: (NOTE) If heparin results are below expected values, and patient dosage has  been confirmed, suggest follow up testing of antithrombin III levels. Performed at [facility], [place] [address], [place], [place] [address]   Glucose, capillary     Status: None   Collection Time:  11/18/19 12:45 AM  Result Value Ref Range   Glucose-Capillary 85 70 - 99 mg/dL  Heparin level (unfractionated)     Status: Abnormal   Collection Time: 11/18/19  3:05 AM  Result Value Ref Range   Heparin Unfractionated 0.18 (L) 0.30 - 0.70 IU/mL    Comment: (NOTE) If heparin results are below expected values, and patient dosage has  been confirmed, suggest follow up testing of antithrombin III levels. Performed at [facility], [place] [address] [place], [place] [address]   Procalcitonin     Status: None   Collection Time: 11/18/19  3:06 AM  Result Value Ref Range   Procalcitonin 31.62 ng/mL    Comment:        Interpretation: PCT >= 10 ng/mL: Important systemic inflammatory response, almost exclusively  due to severe bacterial sepsis or septic shock. (NOTE)       Sepsis PCT Algorithm           Lower Respiratory Tract                                      Infection PCT Algorithm    ----------------------------     ----------------------------         PCT < 0.25 ng/mL                PCT < 0.10 ng/mL         Strongly encourage             Strongly discourage   discontinuation of antibiotics    initiation of antibiotics    ----------------------------     -----------------------------       PCT 0.25 - 0.50 ng/mL            PCT 0.10 - 0.25 ng/mL               OR       >80% decrease in PCT            Discourage initiation of                                            antibiotics      Encourage discontinuation           of antibiotics    ----------------------------     -----------------------------         PCT >= 0.50 ng/mL              PCT 0.26 - 0.50 ng/mL                AND       <80% decrease in PCT             Encourage initiation of                                             antibiotics       Encourage continuation           of antibiotics    ----------------------------     -----------------------------        PCT >= 0.50 ng/mL  PCT > 0.50 ng/mL               AND         increase in PCT                  Strongly encourage                                      initiation of antibiotics    Strongly encourage escalation           of antibiotics                                     -----------------------------                                           PCT <= 0.25 ng/mL                                                 OR                                        > 80% decrease in PCT                                     Discontinue / Do not initiate                                             antibiotics Performed at [facility], [address] [place], [place] [address]   AM Basic metabolic panel     Status: Abnormal   Collection Time: 11/18/19  3:06 AM   Result Value Ref Range   Sodium 138 135 - 145 mmol/L   Potassium 4.1 3.5 - 5.1 mmol/L   Chloride 101 98 - 111 mmol/L   CO2 22 22 - 32 mmol/L   Glucose, Bld 83 70 - 99 mg/dL   BUN 42 (H) 6 - 20 mg/dL   Creatinine, Ser 8.77 (H) 0.61 - 1.24 mg/dL   Calcium 8.1 (L) 8.9 - 10.3 mg/dL   GFR calc non Af Amer 6 (L) >60 mL/min   GFR calc Af Amer 7 (L) >60 mL/min   Anion gap 15 5 - 15    Comment: Performed at [facility], [place] [address] [place], [place] [address]  AM Magnesium     Status: None   Collection Time: 11/18/19  3:06 AM  Result Value Ref Range   Magnesium 1.8 1.7 - 2.4 mg/dL    Comment: Performed at [place] [address], [place], [place] [address]  AM Phosphorus     Status: Abnormal   Collection Time: 11/18/19  3:06 AM  Result Value Ref Range   Phosphorus 5.9 (H) 2.5 - 4.6  mg/dL    Comment: Performed at [facility], [place] [address], [place], [place] [address]  Glucose, capillary     Status: Abnormal   Collection Time: 11/18/19  4:56 AM  Result Value Ref Range   Glucose-Capillary 60 (L) 70 - 99 mg/dL  Glucose, capillary     Status: None   Collection Time: 11/18/19  5:44 AM  Result Value Ref Range   Glucose-Capillary 95 70 - 99 mg/dL  Glucose, capillary     Status: Abnormal   Collection Time: 11/18/19  8:31 AM  Result Value Ref Range   Glucose-Capillary 39 (LL) 70 - 99 mg/dL   Comment 1 Notify RN   Glucose, capillary     Status: Abnormal   Collection Time: 11/18/19  8:32 AM  Result Value Ref Range   Glucose-Capillary 65 (L) 70 - 99 mg/dL  Glucose, capillary     Status: Abnormal   Collection Time: 11/18/19 10:11 AM  Result Value Ref Range   Glucose-Capillary 66 (L) 70 - 99 mg/dL  Glucose, capillary     Status: Abnormal   Collection Time: 11/18/19 10:43 AM  Result Value Ref Range   Glucose-Capillary 110 (H) 70 - 99 mg/dL  Glucose, capillary     Status: Abnormal   Collection Time: 11/18/19 11:34 AM  Result Value Ref Range   Glucose-Capillary  66 (L) 70 - 99 mg/dL  Glucose, capillary     Status: None   Collection Time: 11/18/19 12:33 PM  Result Value Ref Range   Glucose-Capillary 87 70 - 99 mg/dL    Ct Head Wo Contrast  Result Date: 11/16/2019 CLINICAL DATA:  Status post cardiac arrest and CPR. EXAM: CT HEAD WITHOUT CONTRAST TECHNIQUE: Contiguous axial images were obtained from the base of the skull through the vertex without intravenous contrast. COMPARISON:  Head CT 07/15/2012 FINDINGS: Brain: Streak artifact from overlying monitoring devices particularly in the left frontal region. No intracranial hemorrhage, mass effect, or midline shift. No evidence of cerebral edema. Gray-white differentiation is preserved. No hydrocephalus. The basilar cisterns are patent. No evidence of territorial infarct or acute ischemia. No extra-axial or intracranial fluid collection. Vascular: No hyperdense vessel. Skull: No fracture or focal lesion. Sinuses/Orbits: Fluid within the nasopharynx. Fluid level in the right maxillary sinus, sphenoid sinuses, and throughout the ethmoid air cells. No acute orbital abnormality. Minimal opacification of lower mastoid air cells. Other: None. IMPRESSION: 1. No acute intracranial abnormality, allowing for mild streak artifact limitations. 2. Inflammatory changes in the paranasal sinuses, likely due to tracheostomy tube placement. Electronically Signed   By: Keith Rake M.D.   On: 11/16/2019 15:33   Ct Angio Chest Pe W Or Wo Contrast  Result Date: 11/16/2019 CLINICAL DATA:  Cardiac arrest during dialysis today. EXAM: CT ANGIOGRAPHY CHEST WITH CONTRAST TECHNIQUE: Multidetector CT imaging of the chest was performed using the standard protocol during bolus administration of intravenous contrast. Multiplanar CT image reconstructions and MIPs were obtained to evaluate the vascular anatomy. CONTRAST:  15mL OMNIPAQUE IOHEXOL 350 MG/ML SOLN COMPARISON:  05/19/2017. FINDINGS: Cardiovascular: Image quality is degraded by  respiratory motion, expiratory phase imaging and streak artifact from the patient's arms. These factors limit evaluation of the segmental and subsegmental pulmonary arteries. There are suspected filling defects, however, at the segmental level in the right middle lobe (7/220) and left lower lobe (7/216). No large central or lobar pulmonary embolus. Atherosclerotic calcification of the aorta and coronary arteries. Pulmonic trunk and heart are enlarged. No pericardial effusion. Mediastinum/Nodes: Air is  seen in the superior mediastinum. Mediastinal lymph nodes measure up to 1.5 cm in the prevascular space, as before. No definite hilar or axillary adenopathy. Esophagus is grossly unremarkable. Lungs/Pleura: Tracheostomy is in place. Large right pleural effusion with collapse/consolidation in the adjacent right upper and right lower lobes. There is airspace consolidation in the left lower lobe with a rounded area of fluid density in the base of the left hemithorax, adjacent to the left hemidiaphragm, measuring 4.2 cm (5/107), new from 05/18/2017. Increased attenuation throughout the lungs is likely due to expiratory phase imaging. No pneumothorax. Trace left pleural fluid. Airway is otherwise unremarkable. Upper Abdomen: Reflux of contrast into the IVC and hepatic veins. Again, streak artifact from the patient's arms degrades image quality. Visualized portions of the liver, adrenal glands, spleen, pancreas and stomach are grossly unremarkable. Perihepatic and perisplenic ascites. Musculoskeletal: Acute anterior rib fractures bilaterally, related to cardiopulmonary resuscitation. Subcutaneous emphysema is seen in the left neck and upper left chest. Old posterior right rib fractures. Bones appear dense, indicative of renal osteodystrophy. Review of the MIP images confirms the above findings. IMPRESSION: 1. Assessment for pulmonary emboli is fairly compromised by respiratory motion, expiratory phase imaging and streak  artifact from the patient's arms. There are segmental low-attenuation filling defects associated with the right middle and left lower lobe pulmonary arteries, however, suspicious for pulmonary emboli. No large central or lobar pulmonary embolus. 2. Pneumomediastinum and subcutaneous emphysema with bilateral anterior rib fractures related to cardiopulmonary resuscitation. No pneumothorax. 3. Large right pleural effusion. New loculated collection of fluid at the base of the left hemithorax may be within the left pleural space. A new small diaphragmatic defect on the left cannot be excluded, given aggressive cardiopulmonary resuscitation. 4. Collapse/consolidation in the right middle and right lower lobes. Additional airspace consolidation in the left lower lobe. Findings can be seen with aspiration. 5. Mediastinal adenopathy may be reactive. Difficult to exclude a lymphoproliferative disorder. 6. Ascites. 7. Aortic atherosclerosis (ICD10-170.0). Coronary artery calcification. 8. Enlarged pulmonic trunk, indicative of pulmonary arterial hypertension. Electronically Signed   By: Lorin Picket M.D.   On: 11/16/2019 16:08   Am Dg Chest Port 1 View  Result Date: 11/18/2019 CLINICAL DATA:  Cardiac arrest. EXAM: PORTABLE CHEST 1 VIEW COMPARISON:  11/17/2019 FINDINGS: Tracheostomy tube in adequate position. Right subclavian central venous catheter with tip over the SVC. Lungs are adequately inflated demonstrate moderate stable hazy bilateral perihilar opacification likely moderate interstitial edema. Possible small amount right pleural fluid and basilar atelectasis. Stable cardiomegaly. Remainder of the exam is unchanged. IMPRESSION: 1. Stable cardiomegaly with evidence of stable moderate interstitial edema and possible small right effusion/atelectasis. 2.  Tubes and lines as described. Electronically Signed   By: Marin Olp M.D.   On: 11/18/2019 08:20   Dg Chest Port 1 View  Result Date: 11/17/2019 CLINICAL  DATA:  Respiratory failure. EXAM: PORTABLE CHEST 1 VIEW COMPARISON:  11/16/2019 FINDINGS: Tracheostomy tube and central venous catheter appear in good position, unchanged. Persistent cardiomegaly. The bilateral pulmonary infiltrates have improved. However, there is increased density at the lung bases, likely representing increased effusions. No other change. IMPRESSION: 1. Increased bilateral pleural effusions. 2. Improved bilateral pulmonary infiltrates. 3. No other change. 4. Stable cardiomegaly. Electronically Signed   By: Lorriane Shire M.D.   On: 11/17/2019 12:10   Dg Chest Port 1 View  Result Date: 11/16/2019 CLINICAL DATA:  Central line insertion. Pulmonary infiltrates. EXAM: PORTABLE CHEST 1 VIEW 6:28 p.m. COMPARISON:  Chest x-ray 11/16/2019 at 10:57 a.m.  FINDINGS: Tracheostomy tube in good position. New right central venous catheter tip is in the superior vena cava approximately 3 cm below the carina in good position. The bilateral pulmonary infiltrates have progressed particularly in the right upper and mid lung zone. Cardiomegaly. No acute bone abnormality. IMPRESSION: 1. New right central venous catheter is in good position. 2. Progressive bilateral pulmonary infiltrates, particularly in the right upper and mid lung zone. 3. No pneumothorax. Electronically Signed   By: Lorriane Shire M.D.   On: 11/16/2019 18:40    MBE:[id number] except as listed in admit H&P  Blood pressure 108/62, pulse (!) 117, temperature 99 F (37.2 C), temperature source Rectal, resp. rate 14, height 5' 10.5" (1.791 m), weight 80 kg, SpO2 97 %.  PHYSICAL EXAM: Overall appearance: On a ventilator, comatose. Head:  Normocephalic, atraumatic. Ears: External ears look healthy. Nose: External nose is healthy in appearance. Internal nasal exam free of any lesions or obstruction. Oral Cavity/Pharynx:  There are no mucosal lesions or masses identified. Larynx/Hypopharynx: Deferred Neuro: Comatose. Neck: Tracheostomy tube  in place within the cricothyrotomy site.  No palpable neck masses.  Studies Reviewed: none  Procedures: none   Assessment/Plan: Airway is stable at the time of the evaluation today.  I am available over the weekend if any needs arise including converting to a formal tracheostomy.  Izora Gala 11/18/2019, 1:08 PM

## 2019-11-18 NOTE — Procedures (Signed)
Cortrak  Tube Type:  Cortrak - 43 inches Tube Location:  Left nare Initial Placement:  Stomach Secured by: Bridle Technique Used to Measure Tube Placement:  Documented cm marking at nare/ corner of mouth Cortrak Secured At:  60 cm     Cortrak Tube Team Note:  Consult received to place a Cortrak feeding tube.   No x-ray is required. RN may begin using tube.    If the tube becomes dislodged please keep the tube and contact the Cortrak team at [email or web address] (password [email or web address]) for replacement.  If after hours and replacement cannot be delayed, place a NG tube and confirm placement with an abdominal x-ray.    Koleen Distance MS, RD, LDN Pager #- [phone] Office#- [phone] After Hours Pager: [phone]

## 2019-11-18 NOTE — Progress Notes (Signed)
Progress Note  Patient Name: [name] [name] Date of Encounter: 11/18/2019  Primary Cardiologist: Fransico Him, MD   Subjective   Patient intubated, on cooling protocol. nonresponsive  Inpatient Medications    Scheduled Meds:  artificial tears  1 application Both Eyes [id number]   chlorhexidine gluconate (MEDLINE KIT)  15 mL Mouth Rinse BID   Chlorhexidine Gluconate Cloth  6 each Topical Daily   fentaNYL (SUBLIMAZE) injection  100 mcg Intravenous Once   mouth rinse  15 mL Mouth Rinse 10 times per day   midazolam  2 mg Intravenous Once   pantoprazole (PROTONIX) IV  40 mg Intravenous QHS   Thrombi-Pad  1 each Topical Once   Continuous Infusions:   prismasol BGK 4/2.5      prismasol BGK 4/2.5     sodium chloride Stopped (11/17/19 0836)   cisatracurium (NIMBEX) infusion Stopped (11/18/19 0960)   fentaNYL infusion INTRAVENOUS 100 mcg/hr (11/18/19 0800)   heparin 950 Units/hr (11/18/19 0800)   midazolam 8 mg/hr (11/18/19 0800)   norepinephrine (LEVOPHED) Adult infusion 18 mcg/min (11/18/19 0800)   prismasol BGK 4/2.5     PRN Meds: acetaminophen, [COMPLETED] cisatracurium **AND** cisatracurium (NIMBEX) infusion **AND** cisatracurium, fentaNYL, heparin, midazolam, sodium chloride   Vital Signs    Vitals:   11/18/19 0700 11/18/19 0800 11/18/19 0821 11/18/19 0822  BP: (!) 87/67 (!) 83/62 (!) 83/62   Pulse: (!) 104  (!) 108   Resp: 14 14 16    Temp: (!) 96.8 F (36 C) (!) 97.2 F (36.2 C)    TempSrc:  Rectal    SpO2: 100% 94% 96% 96%  Weight:      Height:        Intake/Output Summary (Last 24 hours) at 11/18/2019 0921 Last data filed at 11/18/2019 0800 Gross per 24 hour  Intake 1322.48 ml  Output 0 ml  Net 1322.48 ml   Last 3 Weights 11/16/2019 08/02/2019 08/01/2019  Weight (lbs) 176 lb 5.9 oz 166 lb 166 lb  Weight (kg) 80 kg 75.297 kg 75.297 kg      Telemetry    Atrial fibrillation, Occ PVCs - Personally Reviewed  ECG    AFib controlled rate. Poor R  wave progression. Prolonged QT- Personally Reviewed  Physical Exam   GEN: on vent via cricothyrotomy.    Neck: No JVD Cardiac: IRRR, no murmurs, rubs, or gallops.  Respiratory: Clear to auscultation anteriorly GI: Soft, nontender, non-distended  MS: No edema; No deformity. Neuro:  Nonresponsive  Labs    High Sensitivity Troponin:   Recent Labs  Lab 11/16/19 1141 11/16/19 1238 11/16/19 1730  TROPONINIHS 171* 243* 679*      Chemistry Recent Labs  Lab 11/16/19 1116  11/17/19 0500  11/17/19 1219 11/17/19 1813 11/18/19 0306  NA 140   < > 136   < > 137 137 138  K 3.4*   < > 4.0   < > 4.3 4.1 4.1  CL 100   < > 100  --  101 100 101  CO2 23  --  21*  --   --   --  22  GLUCOSE 117*   < > 110*  --  97 134* 83  BUN 23*   < > 37*  --  36* 35* 42*  CREATININE 7.01*   < > 8.39*  --  8.50* 8.70* 8.77*  CALCIUM 9.6  --  8.4*  --   --   --  8.1*  PROT 7.7  --   --   --   --   --   --  ALBUMIN 3.5  --   --   --   --   --   --   AST 30  --   --   --   --   --   --   ALT 17  --   --   --   --   --   --   ALKPHOS 98  --   --   --   --   --   --   BILITOT 1.2  --   --   --   --   --   --   GFRNONAA 8*  --  7*  --   --   --  6*  GFRAA 10*  --  8*  --   --   --  7*  ANIONGAP 17*  --  15  --   --   --  15   < > = values in this interval not displayed.     Hematology Recent Labs  Lab 11/16/19 1116 11/16/19 1238  11/17/19 0500 11/17/19 0534 11/17/19 1219 11/17/19 1813  WBC 8.8 10.0  --  11.1*  --   --   --   RBC 4.24 3.45*  --  3.98*  --   --   --   HGB 13.1 12.4*   < > 12.2* 12.9* 13.6 12.6*  HCT 42.5 34.0*   < > 37.0* 38.0* 40.0 37.0*  MCV 100.2* 98.6  --  93.0  --   --   --   MCH 30.9 35.9*  --  30.7  --   --   --   MCHC 30.8 36.5*  --  33.0  --   --   --   RDW 16.1* 16.1*  --  15.7*  --   --   --   PLT 170 178  --  125*  --   --   --    < > = values in this interval not displayed.    BNP Recent Labs  Lab 11/16/19 1356  BNP 962.8*     DDimer No results for  input(s): DDIMER in the last 168 hours.   Radiology    Ct Head Wo Contrast  Result Date: 11/16/2019 CLINICAL DATA:  Status post cardiac arrest and CPR. EXAM: CT HEAD WITHOUT CONTRAST TECHNIQUE: Contiguous axial images were obtained from the base of the skull through the vertex without intravenous contrast. COMPARISON:  Head CT 07/15/2012 FINDINGS: Brain: Streak artifact from overlying monitoring devices particularly in the left frontal region. No intracranial hemorrhage, mass effect, or midline shift. No evidence of cerebral edema. Gray-white differentiation is preserved. No hydrocephalus. The basilar cisterns are patent. No evidence of territorial infarct or acute ischemia. No extra-axial or intracranial fluid collection. Vascular: No hyperdense vessel. Skull: No fracture or focal lesion. Sinuses/Orbits: Fluid within the nasopharynx. Fluid level in the right maxillary sinus, sphenoid sinuses, and throughout the ethmoid air cells. No acute orbital abnormality. Minimal opacification of lower mastoid air cells. Other: None. IMPRESSION: 1. No acute intracranial abnormality, allowing for mild streak artifact limitations. 2. Inflammatory changes in the paranasal sinuses, likely due to tracheostomy tube placement. Electronically Signed   By: Keith Rake M.D.   On: 11/16/2019 15:33   Ct Angio Chest Pe W Or Wo Contrast  Result Date: 11/16/2019 CLINICAL DATA:  Cardiac arrest during dialysis today. EXAM: CT ANGIOGRAPHY CHEST WITH CONTRAST TECHNIQUE: Multidetector CT imaging of the chest was performed using the standard protocol during bolus administration  of intravenous contrast. Multiplanar CT image reconstructions and MIPs were obtained to evaluate the vascular anatomy. CONTRAST:  119m OMNIPAQUE IOHEXOL 350 MG/ML SOLN COMPARISON:  05/19/2017. FINDINGS: Cardiovascular: Image quality is degraded by respiratory motion, expiratory phase imaging and streak artifact from the patient's arms. These factors limit  evaluation of the segmental and subsegmental pulmonary arteries. There are suspected filling defects, however, at the segmental level in the right middle lobe (7/220) and left lower lobe (7/216). No large central or lobar pulmonary embolus. Atherosclerotic calcification of the aorta and coronary arteries. Pulmonic trunk and heart are enlarged. No pericardial effusion. Mediastinum/Nodes: Air is seen in the superior mediastinum. Mediastinal lymph nodes measure up to 1.5 cm in the prevascular space, as before. No definite hilar or axillary adenopathy. Esophagus is grossly unremarkable. Lungs/Pleura: Tracheostomy is in place. Large right pleural effusion with collapse/consolidation in the adjacent right upper and right lower lobes. There is airspace consolidation in the left lower lobe with a rounded area of fluid density in the base of the left hemithorax, adjacent to the left hemidiaphragm, measuring 4.2 cm (5/107), new from 05/18/2017. Increased attenuation throughout the lungs is likely due to expiratory phase imaging. No pneumothorax. Trace left pleural fluid. Airway is otherwise unremarkable. Upper Abdomen: Reflux of contrast into the IVC and hepatic veins. Again, streak artifact from the patient's arms degrades image quality. Visualized portions of the liver, adrenal glands, spleen, pancreas and stomach are grossly unremarkable. Perihepatic and perisplenic ascites. Musculoskeletal: Acute anterior rib fractures bilaterally, related to cardiopulmonary resuscitation. Subcutaneous emphysema is seen in the left neck and upper left chest. Old posterior right rib fractures. Bones appear dense, indicative of renal osteodystrophy. Review of the MIP images confirms the above findings. IMPRESSION: 1. Assessment for pulmonary emboli is fairly compromised by respiratory motion, expiratory phase imaging and streak artifact from the patient's arms. There are segmental low-attenuation filling defects associated with the right  middle and left lower lobe pulmonary arteries, however, suspicious for pulmonary emboli. No large central or lobar pulmonary embolus. 2. Pneumomediastinum and subcutaneous emphysema with bilateral anterior rib fractures related to cardiopulmonary resuscitation. No pneumothorax. 3. Large right pleural effusion. New loculated collection of fluid at the base of the left hemithorax may be within the left pleural space. A new small diaphragmatic defect on the left cannot be excluded, given aggressive cardiopulmonary resuscitation. 4. Collapse/consolidation in the right middle and right lower lobes. Additional airspace consolidation in the left lower lobe. Findings can be seen with aspiration. 5. Mediastinal adenopathy may be reactive. Difficult to exclude a lymphoproliferative disorder. 6. Ascites. 7. Aortic atherosclerosis (ICD10-170.0). Coronary artery calcification. 8. Enlarged pulmonic trunk, indicative of pulmonary arterial hypertension. Electronically Signed   By: MLorin PicketM.D.   On: 11/16/2019 16:08   Am Dg Chest Port 1 View  Result Date: 11/18/2019 CLINICAL DATA:  Cardiac arrest. EXAM: PORTABLE CHEST 1 VIEW COMPARISON:  11/17/2019 FINDINGS: Tracheostomy tube in adequate position. Right subclavian central venous catheter with tip over the SVC. Lungs are adequately inflated demonstrate moderate stable hazy bilateral perihilar opacification likely moderate interstitial edema. Possible small amount right pleural fluid and basilar atelectasis. Stable cardiomegaly. Remainder of the exam is unchanged. IMPRESSION: 1. Stable cardiomegaly with evidence of stable moderate interstitial edema and possible small right effusion/atelectasis. 2.  Tubes and lines as described. Electronically Signed   By: DMarin OlpM.D.   On: 11/18/2019 08:20   Dg Chest Port 1 View  Result Date: 11/17/2019 CLINICAL DATA:  Respiratory failure. EXAM: PORTABLE CHEST 1 VIEW  COMPARISON:  11/16/2019 FINDINGS: Tracheostomy tube and  central venous catheter appear in good position, unchanged. Persistent cardiomegaly. The bilateral pulmonary infiltrates have improved. However, there is increased density at the lung bases, likely representing increased effusions. No other change. IMPRESSION: 1. Increased bilateral pleural effusions. 2. Improved bilateral pulmonary infiltrates. 3. No other change. 4. Stable cardiomegaly. Electronically Signed   By: Lorriane Shire M.D.   On: 11/17/2019 12:10   Dg Chest Port 1 View  Result Date: 11/16/2019 CLINICAL DATA:  Central line insertion. Pulmonary infiltrates. EXAM: PORTABLE CHEST 1 VIEW 6:28 p.m. COMPARISON:  Chest x-ray 11/16/2019 at 10:57 a.m. FINDINGS: Tracheostomy tube in good position. New right central venous catheter tip is in the superior vena cava approximately 3 cm below the carina in good position. The bilateral pulmonary infiltrates have progressed particularly in the right upper and mid lung zone. Cardiomegaly. No acute bone abnormality. IMPRESSION: 1. New right central venous catheter is in good position. 2. Progressive bilateral pulmonary infiltrates, particularly in the right upper and mid lung zone. 3. No pneumothorax. Electronically Signed   By: Lorriane Shire M.D.   On: 11/16/2019 18:40   Dg Chest Portable 1 View  Result Date: 11/16/2019 CLINICAL DATA:  Post CPR.  Tracheostomy tube. EXAM: PORTABLE CHEST 1 VIEW COMPARISON:  Radiographs 10/26/2018 and 12/31/2017. FINDINGS: 1057 hours. Tip of the tracheostomy tube is just below the thoracic inlet. The heart is enlarged. There is vascular congestion with increasing right greater than left perihilar opacities, probably reflecting edema. There is no confluent airspace opacity, pleural effusion or pneumothorax. No acute fractures are seen. IMPRESSION: 1. Worsened right greater than left perihilar opacities most consistent with edema (versus atypical inflammation). 2. Tip of the tracheostomy tube in the proximal trachea, just below the  thoracic inlet. Electronically Signed   By: Richardean Sale M.D.   On: 11/16/2019 11:16    Cardiac Studies   Echo 11/11/19:Study Conclusions  - Left ventricle: The cavity size was mildly dilated. Systolic   function was severely reduced. The estimated ejection fraction   was in the range of 25% to 30%. Diffuse hypokinesis. Although no   diagnostic regional wall motion abnormality was identified, this   possibility cannot be completely excluded on the basis of this   study. Features are consistent with a pseudonormal left   ventricular filling pattern, with concomitant abnormal relaxation   and increased filling pressure (grade 2 diastolic dysfunction). - Aortic valve: Transvalvular velocity was within the normal range.   There was no stenosis. There was no regurgitation. - Mitral valve: Transvalvular velocity was within the normal range.   There was no evidence for stenosis. There was trivial   regurgitation. - Left atrium: The atrium was severely dilated. - Right ventricle: The cavity size was moderately dilated. Wall   thickness was normal. Systolic function was moderately reduced.   RV systolic pressure (S, est): 47 mm Hg. - Right atrium: The atrium was moderately dilated. - Tricuspid valve: There was mild-moderate regurgitation. - Pulmonic valve: There was trivial regurgitation. - Pulmonary arteries: The main pulmonary artery was mildly dilated.   Systolic pressure was moderately increased. PA peak pressure: 47   mm Hg (S). - Inferior vena cava: The vessel was dilated. The respirophasic   diameter changes were blunted (< 50%), consistent with elevated   central venous pressure. - Pericardium, extracardiac: There was no pericardial effusion.  Echo 11/17/19: IMPRESSIONS    1. Left ventricular ejection fraction, by visual estimation, is <20%. The left ventricle has  severely decreased function. Left ventricular septal wall thickness was mildly increased. Mildly increased left  ventricular posterior wall thickness. Cannot  rule out early forming mural thrombus at the apex.  2. Definity contrast agent was given IV to delineate the left ventricular endocardial borders.  3. Left ventricular diastolic function could not be evaluated due to atrial fibrillation.  4. Global right ventricle has normal systolic function.The right ventricular size is normal. No increase in right ventricular wall thickness.  5. Left atrial size was severely dilated.  6. Right atrial size was severely dilated.  7. Moderate to severe mitral annular calcification.  8. The mitral valve is normal in structure. Trace mitral valve regurgitation. No evidence of mitral stenosis.  9. The tricuspid valve is normal in structure. Tricuspid valve regurgitation is trivial. 10. The aortic valve is tricuspid. Aortic valve regurgitation is not visualized. Mild aortic valve sclerosis without stenosis. 11. The pulmonic valve was normal in structure. Pulmonic valve regurgitation is not visualized.     Patient Profile     53 y.o. 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 Lynetta Mare.  Assessment & Plan    1. PEA arrest: occurred at HD with immediate bystander CPR with ROSC achieved after 15 minutes with 1 round of epi, Ca, and bicarb). Etiology unclear ? PE, aspiration, metabolic, acute volume shift in setting of poor LV function. No clear arrhythmia. Doubt ischemic with no acute Ecg changes and no CAD by cath in 2017. - Continue cooling protocol per primary team - continue vent support - No plans for ischemic evaluation at this time - on IV heparin for possible PE and for AFib - Echo shows severe LV dysfunction with EF < 20%. Moderate to severe MR, severe biatrial enlargement.  -Patient is a poor candidate for advanced  CHF therapies. I don't think he is a candidate for ICD given poor social situation/ substance abuse and poor long term prognosis. - continues to need IV pressors for BP control   2. Acute respiratory failure: he underwent emergent cricothyrotomy by EMS prior to arrival. CT Chest with RML and LLL PE, large right pleural effusion with new loculated collection of fluid at the base the left hemithorax, and collapse/consolidation in the RML and RLL possibly 2/2 aspiration. Currently ventilated  - Continue management per primary team - currently on IV heparin  3. New onset atrial fibrillation: noted post resuscitation. Rates well controlled. LHC in 2017 without CAD.  - This patients CHA2DS2-VASc Score and unadjusted Ischemic Stroke Rate (% per year) is equal to 3.2 % stroke rate/year from a score of 3 Above score calculated as 1 point each if present [CHF, HTN, DM, Vascular=MI/PAD/Aortic Plaque, Age if 65-74, or Male] Above score calculated as 2 points each if present [Age > 75, or Stroke/TIA/TE] - on IV heparin - IV amiodarone discontinued due to prolonged QT.  - doubt he will convert to NSR or maintain due to severe biatrial enlargement.   4. Chronic combined CHF: EF 25-30% with G2DD on echo 10/2018. Not a candidate for ICD given non-compliance and drug abuse.  Volume managed with HD.  - Continue volume management per nephrology - now on Levophed for pressure support.  - otherwise as noted in #1  5. HTN: hypotensive on pressor support  6. ESRD on HD:  on dialysis M/W/F.  - Continue management per nephrology  7. Prolonged QT - was normal on admit. Likely related to cooling protocol. Amiodarone discontinued  8. Severe anoxic encephalopathy. EEG unfavorable. Overall prognosis is poor.         For questions or updates, please contact [place] Please consult [email or web address] for contact info under        Signed, Kaydyn Sayas [place], MD  11/18/2019, 9:21 AM

## 2019-11-19 ENCOUNTER — Inpatient Hospital Stay (HOSPITAL_COMMUNITY): Payer: Medicare Other

## 2019-11-19 DIAGNOSIS — I469 Cardiac arrest, cause unspecified: Secondary | ICD-10-CM | POA: Diagnosis not present

## 2019-11-19 DIAGNOSIS — I428 Other cardiomyopathies: Secondary | ICD-10-CM | POA: Diagnosis not present

## 2019-11-19 DIAGNOSIS — N186 End stage renal disease: Secondary | ICD-10-CM | POA: Diagnosis not present

## 2019-11-19 DIAGNOSIS — L8990 Pressure ulcer of unspecified site, unspecified stage: Secondary | ICD-10-CM | POA: Insufficient documentation

## 2019-11-19 DIAGNOSIS — J9601 Acute respiratory failure with hypoxia: Secondary | ICD-10-CM | POA: Diagnosis not present

## 2019-11-19 DIAGNOSIS — I5023 Acute on chronic systolic (congestive) heart failure: Secondary | ICD-10-CM | POA: Diagnosis not present

## 2019-11-19 LAB — CBC WITH DIFFERENTIAL/PLATELET
Abs Immature Granulocytes: 0.13 10*3/uL — ABNORMAL HIGH (ref 0.00–0.07)
Basophils Absolute: 0 10*3/uL (ref 0.0–0.1)
Basophils Relative: 1 %
Eosinophils Absolute: 0.1 10*3/uL (ref 0.0–0.5)
Eosinophils Relative: 1 %
HCT: 35.5 % — ABNORMAL LOW (ref 39.0–52.0)
Hemoglobin: 11.3 g/dL — ABNORMAL LOW (ref 13.0–17.0)
Immature Granulocytes: 2 %
Lymphocytes Relative: 4 %
Lymphs Abs: 0.3 10*3/uL — ABNORMAL LOW (ref 0.7–4.0)
MCH: 30.8 pg (ref 26.0–34.0)
MCHC: 31.8 g/dL (ref 30.0–36.0)
MCV: 96.7 fL (ref 80.0–100.0)
Monocytes Absolute: 0.4 10*3/uL (ref 0.1–1.0)
Monocytes Relative: 5 %
Neutro Abs: 6.1 10*3/uL (ref 1.7–7.7)
Neutrophils Relative %: 87 %
Platelets: 125 10*3/uL — ABNORMAL LOW (ref 150–400)
RBC: 3.67 MIL/uL — ABNORMAL LOW (ref 4.22–5.81)
RDW: 16.3 % — ABNORMAL HIGH (ref 11.5–15.5)
WBC: 7 10*3/uL (ref 4.0–10.5)
nRBC: 0.7 % — ABNORMAL HIGH (ref 0.0–0.2)

## 2019-11-19 LAB — RENAL FUNCTION PANEL
Albumin: 3 g/dL — ABNORMAL LOW (ref 3.5–5.0)
Albumin: 2.8 g/dL — ABNORMAL LOW (ref 3.5–5.0)
Anion gap: 12 (ref 5–15)
Anion gap: 10 (ref 5–15)
BUN: 25 mg/dL — ABNORMAL HIGH (ref 6–20)
BUN: 19 mg/dL (ref 6–20)
CO2: 22 mmol/L (ref 22–32)
CO2: 23 mmol/L (ref 22–32)
Calcium: 7.9 mg/dL — ABNORMAL LOW (ref 8.9–10.3)
Calcium: 8.1 mg/dL — ABNORMAL LOW (ref 8.9–10.3)
Chloride: 101 mmol/L (ref 98–111)
Chloride: 102 mmol/L (ref 98–111)
Creatinine, Ser: 4.45 mg/dL — ABNORMAL HIGH (ref 0.61–1.24)
Creatinine, Ser: 3.03 mg/dL — ABNORMAL HIGH (ref 0.61–1.24)
GFR calc Af Amer: 17 mL/min — ABNORMAL LOW (ref >60)
GFR calc Af Amer: 26 mL/min — ABNORMAL LOW (ref >60)
GFR calc non Af Amer: 14 mL/min — ABNORMAL LOW (ref >60)
GFR calc non Af Amer: 23 mL/min — ABNORMAL LOW (ref >60)
Glucose, Bld: 92 mg/dL (ref 70–99)
Glucose, Bld: 112 mg/dL — ABNORMAL HIGH (ref 70–99)
Phosphorus: 4 mg/dL (ref 2.5–4.6)
Phosphorus: 2.2 mg/dL — ABNORMAL LOW (ref 2.5–4.6)
Potassium: 4.9 mmol/L (ref 3.5–5.1)
Potassium: 4.3 mmol/L (ref 3.5–5.1)
Sodium: 135 mmol/L (ref 135–145)
Sodium: 135 mmol/L (ref 135–145)

## 2019-11-19 LAB — POCT I-STAT 7, (LYTES, BLD GAS, ICA,H+H)
Acid-base deficit: 3 mmol/L — ABNORMAL HIGH (ref 0.0–2.0)
Acid-base deficit: 2 mmol/L (ref 0.0–2.0)
Bicarbonate: 24 mmol/L (ref 20.0–28.0)
Bicarbonate: 23.3 mmol/L (ref 20.0–28.0)
Calcium, Ion: 1.08 mmol/L — ABNORMAL LOW (ref 1.15–1.40)
Calcium, Ion: 1.07 mmol/L — ABNORMAL LOW (ref 1.15–1.40)
HCT: 36 % — ABNORMAL LOW (ref 39.0–52.0)
HCT: 35 % — ABNORMAL LOW (ref 39.0–52.0)
Hemoglobin: 12.2 g/dL — ABNORMAL LOW (ref 13.0–17.0)
Hemoglobin: 11.9 g/dL — ABNORMAL LOW (ref 13.0–17.0)
O2 Saturation: 88 %
O2 Saturation: 97 %
Patient temperature: 36.8
Patient temperature: 36.7
Potassium: 4.8 mmol/L (ref 3.5–5.1)
Potassium: 4.6 mmol/L (ref 3.5–5.1)
Sodium: 135 mmol/L (ref 135–145)
Sodium: 136 mmol/L (ref 135–145)
TCO2: 26 mmol/L (ref 22–32)
TCO2: 25 mmol/L (ref 22–32)
pCO2 arterial: 52.4 mmHg — ABNORMAL HIGH (ref 32.0–48.0)
pCO2 arterial: 42 mmHg (ref 32.0–48.0)
pH, Arterial: 7.268 — ABNORMAL LOW (ref 7.350–7.450)
pH, Arterial: 7.352 (ref 7.350–7.450)
pO2, Arterial: 62 mmHg — ABNORMAL LOW (ref 83.0–108.0)
pO2, Arterial: 95 mmHg (ref 83.0–108.0)

## 2019-11-19 LAB — HEPARIN LEVEL (UNFRACTIONATED)
Heparin Unfractionated: 0.24 IU/mL — ABNORMAL LOW (ref 0.30–0.70)
Heparin Unfractionated: 0.21 IU/mL — ABNORMAL LOW (ref 0.30–0.70)
Heparin Unfractionated: 0.23 IU/mL — ABNORMAL LOW (ref 0.30–0.70)

## 2019-11-19 LAB — MAGNESIUM
Magnesium: 2.2 mg/dL (ref 1.7–2.4)
Magnesium: 2.3 mg/dL (ref 1.7–2.4)

## 2019-11-19 LAB — GLUCOSE, CAPILLARY
Glucose-Capillary: 103 mg/dL — ABNORMAL HIGH (ref 70–99)
Glucose-Capillary: 106 mg/dL — ABNORMAL HIGH (ref 70–99)
Glucose-Capillary: 112 mg/dL — ABNORMAL HIGH (ref 70–99)
Glucose-Capillary: 117 mg/dL — ABNORMAL HIGH (ref 70–99)
Glucose-Capillary: 60 mg/dL — ABNORMAL LOW (ref 70–99)

## 2019-11-19 LAB — PHOSPHORUS: Phosphorus: 3.9 mg/dL (ref 2.5–4.6)

## 2019-11-19 MED ORDER — AMIODARONE HCL IN DEXTROSE 360-4.14 MG/200ML-% IV SOLN
60.0000 mg/h | INTRAVENOUS | Status: AC
  Administered 2019-11-19 (×2): 60 mg/h via INTRAVENOUS
  Filled 2019-11-19: qty 200

## 2019-11-19 MED ORDER — DOCUSATE SODIUM 50 MG/5ML PO LIQD
100.0000 mg | Freq: Every day | ORAL | Status: DC
  Administered 2019-11-19 – 2019-12-23 (×17): 100 mg
  Filled 2019-11-19 (×19): qty 10

## 2019-11-19 MED ORDER — WHITE PETROLATUM EX OINT
TOPICAL_OINTMENT | Freq: Every day | CUTANEOUS | Status: DC
  Administered 2019-11-19 – 2019-11-20 (×2): 0.2 via TOPICAL
  Administered 2019-11-21: 09:00:00 via TOPICAL
  Administered 2019-11-22 – 2019-11-25 (×3): 0.2 via TOPICAL
  Administered 2019-11-26: 09:00:00 via TOPICAL
  Administered 2019-11-27: 1 via TOPICAL
  Administered 2019-11-28: 12:00:00 via TOPICAL
  Administered 2019-11-29 – 2019-12-02 (×2): 0.2 via TOPICAL
  Administered 2019-12-04: 1 via TOPICAL
  Administered 2019-12-07 – 2019-12-09 (×3): 0.2 via TOPICAL
  Administered 2019-12-10: 1 via TOPICAL
  Administered 2019-12-12: 0.2 via TOPICAL
  Administered 2019-12-13: 1 via TOPICAL
  Administered 2019-12-14 – 2019-12-23 (×9): 0.2 via TOPICAL
  Filled 2019-11-19 (×6): qty 28.35

## 2019-11-19 MED ORDER — DEXTROSE 50 % IV SOLN
INTRAVENOUS | Status: AC
  Administered 2019-11-19: 25 mL
  Filled 2019-11-19: qty 50

## 2019-11-19 MED ORDER — AMIODARONE HCL IN DEXTROSE 360-4.14 MG/200ML-% IV SOLN
30.0000 mg/h | INTRAVENOUS | Status: DC
  Filled 2019-11-19 (×2): qty 200

## 2019-11-19 NOTE — Progress Notes (Signed)
ANTICOAGULATION CONSULT NOTE   Pharmacy Consult for Heparin Indication: atrial fibrillation and pulmonary embolus  Patient Measurements: Height: 5' 10.5" (179.1 cm) Weight: 176 lb 5.9 oz (80 kg) IBW/kg (Calculated) : 74.15  Vital Signs: Temp: 98.6 F (37 C) (12/05 2100) Temp Source: Rectal (12/05 1800) BP: 79/60 (12/05 2200) Pulse Rate: 101 (12/05 2230)  Labs: Recent Labs    11/17/19 0500  11/18/19 1619 11/19/19 0312 11/19/19 0321 11/19/19 0402 11/19/19 0828 11/19/19 1039 11/19/19 1453 11/19/19 2117  HGB 12.2*   < >  --   --  12.2* 11.3* 11.9*  --   --   --   HCT 37.0*   < >  --   --  36.0* 35.5* 35.0*  --   --   --   PLT 125*  --   --   --   --  125*  --   --   --   --   HEPARINUNFRC  --    < > 0.34 0.24*  --   --   --  0.21*  --  0.23*  CREATININE 8.39*   < > 7.01* 4.45*  --   --   --   --  3.03*  --    < > = values in this interval not displayed.    Estimated Creatinine Clearance: 30.3 mL/min (A) (by C-G formula based on SCr of 3.03 mg/dL (H)).  Assessment: 53 yo male with new onset afib and possible PE for heparin -heparin level= 0.23 after increase to1200 units/hr  Goal of Therapy:  Heparin level 0.3-0.7 units/ml (targeting lower end of range, due to episodic epistaxis) Monitor platelets by anticoagulation protocol: Yes   Plan:  Increase heparin to 1400 units/hr Check 8-hr heparin level Monitor daily heparin level, CBC  Hildred Laser, PharmD Clinical Pharmacist **Pharmacist phone directory can now be found on [email or web address] ([email or web address]).  Listed under [place].

## 2019-11-19 NOTE — Progress Notes (Signed)
Progress Note  Patient Name: [name] [name] Date of Encounter: 11/19/2019  Primary Cardiologist: Fransico Him, MD   Subjective   Patient intubated, on cooling protocol. Nonresponsive,  Inpatient Medications    Scheduled Meds:  artificial tears  1 application Both Eyes T5V   B-complex with vitamin C  1 tablet Per Tube Daily   chlorhexidine gluconate (MEDLINE KIT)  15 mL Mouth Rinse BID   Chlorhexidine Gluconate Cloth  6 each Topical Daily   feeding supplement (PRO-STAT SUGAR FREE 64)  30 mL Per Tube TID   fentaNYL (SUBLIMAZE) injection  100 mcg Intravenous Once   mouth rinse  15 mL Mouth Rinse 10 times per day   midazolam  2 mg Intravenous Once   pantoprazole (PROTONIX) IV  40 mg Intravenous QHS   Thrombi-Pad  1 each Topical Once   Continuous Infusions:   prismasol BGK 4/2.5 500 mL/hr at 11/18/19 2132    prismasol BGK 4/2.5 500 mL/hr at 11/18/19 2132   sodium chloride Stopped (11/17/19 0836)   ampicillin-sulbactam (UNASYN) IV Stopped (11/19/19 0558)   dextrose 50 mL/hr at 11/19/19 0800   feeding supplement (VITAL AF 1.2 CAL)     fentaNYL infusion INTRAVENOUS 100 mcg/hr (11/19/19 0800)   heparin 1,050 Units/hr (11/19/19 0800)   midazolam Stopped (11/18/19 1434)   norepinephrine (LEVOPHED) Adult infusion 26 mcg/min (11/19/19 0800)   prismasol BGK 4/2.5 2,000 mL/hr at 11/19/19 0531   PRN Meds: acetaminophen, fentaNYL, heparin, midazolam, sodium chloride   Vital Signs    Vitals:   11/19/19 0700 11/19/19 0730 11/19/19 0800 11/19/19 0844  BP: 115/87 97/83 97/79    Pulse:  (!) 34  61  Resp: 20 20 (!) 22 20  Temp:   98.1 F (36.7 C)   TempSrc:   Rectal   SpO2: 100% (!) 87% 93% 91%  Weight:      Height:        Intake/Output Summary (Last 24 hours) at 11/19/2019 0856 Last data filed at 11/19/2019 0800 Gross per 24 hour  Intake 2545.31 ml  Output 1896 ml  Net 649.31 ml   Last 3 Weights 11/16/2019 08/02/2019 08/01/2019  Weight (lbs) 176 lb 5.9 oz  166 lb 166 lb  Weight (kg) 80 kg 75.297 kg 75.297 kg      Telemetry    Atrial fibrillation, ventricular rates in 120', frequent PVCs - Personally Reviewed  ECG    AFib . Poor R wave progression. Prolonged QT- Personally Reviewed  Physical Exam   GEN: on vent via cricothyrotomy.    Neck: No JVD Cardiac: IRRR, no murmurs, rubs, or gallops.  Respiratory: Crackles to auscultation anteriorly GI: Soft, nontender, non-distended  MS: No edema; No deformity. Neuro:  Nonresponsive  Labs    High Sensitivity Troponin:   Recent Labs  Lab 11/16/19 1141 11/16/19 1238 11/16/19 1730  TROPONINIHS 171* 243* 679*      Chemistry Recent Labs  Lab 11/16/19 1116  11/18/19 0306 11/18/19 0547 11/18/19 1619 11/19/19 0312 11/19/19 0321  NA 140   < > 138 138 136 135 135  K 3.4*   < > 4.1 3.8 4.9 4.9 4.8  CL 100   < > 101 104 102 101  --   CO2 23   < > 22  --  22 22  --   GLUCOSE 117*   < > 83 94 79 92  --   BUN 23*   < > 42* 37* 38* 25*  --   CREATININE 7.01*   < >  8.77* 9.00* 7.01* 4.45*  --   CALCIUM 9.6   < > 8.1*  --  8.0* 7.9*  --   PROT 7.7  --   --   --   --   --   --   ALBUMIN 3.5  --   --   --  3.1* 3.0*  --   AST 30  --   --   --   --   --   --   ALT 17  --   --   --   --   --   --   ALKPHOS 98  --   --   --   --   --   --   BILITOT 1.2  --   --   --   --   --   --   GFRNONAA 8*   < > 6*  --  8* 14*  --   GFRAA 10*   < > 7*  --  10* 17*  --   ANIONGAP 17*   < > 15  --  12 12  --    < > = values in this interval not displayed.     Hematology Recent Labs  Lab 11/16/19 1238  11/17/19 0500  11/18/19 0547 11/19/19 0321 11/19/19 0402  WBC 10.0  --  11.1*  --   --   --  7.0  RBC 3.45*  --  3.98*  --   --   --  3.67*  HGB 12.4*   < > 12.2*   < > 12.6* 12.2* 11.3*  HCT 34.0*   < > 37.0*   < > 37.0* 36.0* 35.5*  MCV 98.6  --  93.0  --   --   --  96.7  MCH 35.9*  --  30.7  --   --   --  30.8  MCHC 36.5*  --  33.0  --   --   --  31.8  RDW 16.1*  --  15.7*  --   --   --   16.3*  PLT 178  --  125*  --   --   --  125*   < > = values in this interval not displayed.    BNP Recent Labs  Lab 11/16/19 1356  BNP 962.8*     DDimer No results for input(s): DDIMER in the last 168 hours.   Radiology    Am Dg Chest Port 1 View  Result Date: 11/18/2019 CLINICAL DATA:  Cardiac arrest. EXAM: PORTABLE CHEST 1 VIEW COMPARISON:  11/17/2019 FINDINGS: Tracheostomy tube in adequate position. Right subclavian central venous catheter with tip over the SVC. Lungs are adequately inflated demonstrate moderate stable hazy bilateral perihilar opacification likely moderate interstitial edema. Possible small amount right pleural fluid and basilar atelectasis. Stable cardiomegaly. Remainder of the exam is unchanged. IMPRESSION: 1. Stable cardiomegaly with evidence of stable moderate interstitial edema and possible small right effusion/atelectasis. 2.  Tubes and lines as described. Electronically Signed   By: Marin Olp M.D.   On: 11/18/2019 08:20   Dg Chest Port 1 View  Result Date: 11/17/2019 CLINICAL DATA:  Respiratory failure. EXAM: PORTABLE CHEST 1 VIEW COMPARISON:  11/16/2019 FINDINGS: Tracheostomy tube and central venous catheter appear in good position, unchanged. Persistent cardiomegaly. The bilateral pulmonary infiltrates have improved. However, there is increased density at the lung bases, likely representing increased effusions. No other change. IMPRESSION: 1. Increased bilateral pleural effusions. 2. Improved bilateral pulmonary infiltrates. 3.  No other change. 4. Stable cardiomegaly. Electronically Signed   By: Lorriane Shire M.D.   On: 11/17/2019 12:10    Cardiac Studies   Echo 11/11/19:Study Conclusions  - Left ventricle: The cavity size was mildly dilated. Systolic   function was severely reduced. The estimated ejection fraction   was in the range of 25% to 30%. Diffuse hypokinesis. Although no   diagnostic regional wall motion abnormality was identified, this    possibility cannot be completely excluded on the basis of this   study. Features are consistent with a pseudonormal left   ventricular filling pattern, with concomitant abnormal relaxation   and increased filling pressure (grade 2 diastolic dysfunction). - Aortic valve: Transvalvular velocity was within the normal range.   There was no stenosis. There was no regurgitation. - Mitral valve: Transvalvular velocity was within the normal range.   There was no evidence for stenosis. There was trivial   regurgitation. - Left atrium: The atrium was severely dilated. - Right ventricle: The cavity size was moderately dilated. Wall   thickness was normal. Systolic function was moderately reduced.   RV systolic pressure (S, est): 47 mm Hg. - Right atrium: The atrium was moderately dilated. - Tricuspid valve: There was mild-moderate regurgitation. - Pulmonic valve: There was trivial regurgitation. - Pulmonary arteries: The main pulmonary artery was mildly dilated.   Systolic pressure was moderately increased. PA peak pressure: 47   mm Hg (S). - Inferior vena cava: The vessel was dilated. The respirophasic   diameter changes were blunted (< 50%), consistent with elevated   central venous pressure. - Pericardium, extracardiac: There was no pericardial effusion.  Echo 11/17/19: IMPRESSIONS    1. Left ventricular ejection fraction, by visual estimation, is <20%. The left ventricle has severely decreased function. Left ventricular septal wall thickness was mildly increased. Mildly increased left ventricular posterior wall thickness. Cannot  rule out early forming mural thrombus at the apex.  2. Definity contrast agent was given IV to delineate the left ventricular endocardial borders.  3. Left ventricular diastolic function could not be evaluated due to atrial fibrillation.  4. Global right ventricle has normal systolic function.The right ventricular size is normal. No increase in right ventricular  wall thickness.  5. Left atrial size was severely dilated.  6. Right atrial size was severely dilated.  7. Moderate to severe mitral annular calcification.  8. The mitral valve is normal in structure. Trace mitral valve regurgitation. No evidence of mitral stenosis.  9. The tricuspid valve is normal in structure. Tricuspid valve regurgitation is trivial. 10. The aortic valve is tricuspid. Aortic valve regurgitation is not visualized. Mild aortic valve sclerosis without stenosis. 11. The pulmonic valve was normal in structure. Pulmonic valve regurgitation is not visualized.     Patient Profile     53 y.o. 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 Lynetta Mare.  Assessment & Plan    1. PEA arrest: occurred at HD with immediate bystander CPR with ROSC achieved after 15 minutes with 1 round of epi, Ca, and bicarb). Etiology unclear ? PE, aspiration, metabolic, acute volume shift in setting of poor LV function. No clear arrhythmia. Doubt ischemic with no acute Ecg changes and no CAD by cath in 2017. - Continue cooling protocol per primary team - continue vent support -  No plans for ischemic evaluation at this time - on IV heparin for possible PE and for AFib - Echo shows severe LV dysfunction with EF < 20%. Tis is chronic LVEF 25-30% a year ago  Moderate to severe MR, severe biatrial enlargement.  -Patient is a poor candidate for advanced CHF therapies. I don't think he is a candidate for ICD given poor social situation/ substance abuse and poor long term prognosis. - continues to need IV pressors for BP control   2. Acute respiratory failure: he underwent emergent cricothyrotomy by EMS prior to arrival. CT Chest with RML and LLL PE, large right pleural effusion with new loculated collection of fluid at  the base the left hemithorax, and collapse/consolidation in the RML and RLL possibly 2/2 aspiration. Currently ventilated  - Continue management per primary team - currently on IV heparin  3. New onset atrial fibrillation: noted post resuscitation. Rates well controlled. LHC in 2017 without CAD.  - This patients CHA2DS2-VASc Score and unadjusted Ischemic Stroke Rate (% per year) is equal to 3.2 % stroke rate/year from a score of 3 Above score calculated as 1 point each if present [CHF, HTN, DM, Vascular=MI/PAD/Aortic Plaque, Age if 65-74, or Male] Above score calculated as 2 points each if present [Age > 75, or Stroke/TIA/TE] - on IV heparin - IV amiodarone discontinued due to prolonged QT.  - doubt he will convert to NSR or maintain due to severe biatrial enlargement.   4. Chronic combined CHF: EF 25-30% with G2DD on echo 10/2018. Not a candidate for ICD given non-compliance and drug abuse.  Volume managed with HD.  - Continue volume management per nephrology - now on Levophed for pressure support 24 mcg/kg/min.  - otherwise as noted in #1  5. HTN: hypotensive on pressor support  6. ESRD on HD: on dialysis M/W/F.  - Continue management per nephrology  7. Prolonged QT - was normal on admit. Likely related to cooling protocol. Amiodarone discontinued  8. Severe anoxic encephalopathy. EEG unfavorable. Overall prognosis is poor.    Overall prognosis here is very poor, the patient is not a candidate for any advanced therapies given chronic CHF, CKD stage V and ongoing drug abuse.  We will not plan for any ischemic workup. He is fluid overloaded, but diuresis managed by HD.  We will sign off, please call us with any questions.   For questions or updates, please contact [place] Please consult [email or web address] for contact info under    Signed, Ena Dawley, MD  11/19/2019, 8:56 AM

## 2019-11-19 NOTE — Progress Notes (Signed)
vLTM EEG complete. 2 skin breakdown spots on forehead. Let nursing know

## 2019-11-19 NOTE — Progress Notes (Signed)
NAME:  [name] [name], MRN:  [id number], DOB:  1966/09/19, LOS: 3 ADMISSION DATE:  11/16/2019, CONSULTATION DATE:  12/2 REFERRING MD:  Dr. Sedonia Small, CHIEF COMPLAINT:  Post cardiac arrest    Brief History   53yo male presented post arrest which occurred during iHD treatment. This was a witnessed arrest with quick bystander CPR. Received 3 rounds of epi, 1g of calcium and 50 mEq of bicarb with ROSC after 15 minutes.   History of present illness   HPI obtained from medical chart review as patient is currently intubated on mechanical ventilation.  53 year old male with extensive PMH significant for but not limited to NSVT, combined systolic and diastolic CHF, polysubstance abuse, and  end-stage renal disease who presented to the ED after cardiac arrest.  Patient had presented to outpatient hemodialysis appointment 3-1/2 hours into HD treatment when patient suffered a witnessed cardiac arrest. On arrival to EMS patients rhythm was PEA.  Patient received 3 rounds of epi, 1g of calcium and 50 mEq of bicarb with ROSC after 15 minutes. Emergent cricothyrotomy performed by EMS due to failed intubation with [facility] airway.   Vital signs stable on admission, has remained in sinus rhythm since ROSC.  ABG on admission pH 7.227 / pCO2 65.7 / pO2 349.0 / bicarb 27.3. Troponin 171, creatinine 7.01 and  lactic acid 5.9.   PCCM consulted for admission   Past Medical History  NSVT 2017 Nonischemic cardiomyopathy Medical noncompliance Polysubstance abuse Hypertension End-stage renal disease Combined systolic and diastolic congestive heart failure Cocaine abuse Alcohol abuse Prolonged QTC  Significant Hospital Events   12/2 admitted post cardiac arrest  Consults:  Cardiology Nephrology  Procedures:  12/2 remeasuring cricothyrotomy per EMS  Significant Diagnostic Tests:  CXR 12/2 > Worsened right greater than left perihilar opacities most consistent with edema (versus atypical inflammation). Tip of the  tracheostomy tube in the proximal trachea, just below the thoracic inlet.  Micro Data:  COVID 12/2 > negative  Antimicrobials:    Interim history/subjective:   Weaning norepinephrine Awake and following commands In AF RVR  Best practice:    Objective   Blood pressure (!) 88/58, pulse (!) 107, temperature 98.6 F (37 C), temperature source Rectal, resp. rate 20, height 5' 10.5" (1.791 m), weight 80 kg, SpO2 93 %. CVP:  [14 mmHg-35 mmHg] 23 mmHg  Vent Mode: PRVC FiO2 (%):  [40 %-50 %] 50 % Set Rate:  [14 bmp-20 bmp] 20 bmp Vt Set:  [590 mL] 590 mL PEEP:  [5 cmH20] 5 cmH20 Plateau Pressure:  [24 cmH20-33 cmH20] 29 cmH20   Intake/Output Summary (Last 24 hours) at 11/19/2019 1340 Last data filed at 11/19/2019 1300 Gross per 24 hour  Intake 2754.99 ml  Output 2819 ml  Net -64.01 ml   Filed Weights   11/16/19 1300  Weight: 80 kg    Examination: General: Obese male in neuromuscular blockade and full mechanical ventilatory support HEENT: Emergency chronic is in place functioning well, Neuro: Resting comfortably, per nursing follows commands CV: Heart sounds are regular PULM: Coarse rhonchi GI: soft, bsx4 active  GU: None Extremities: warm/dry,  edema left bicep graft positive bruit Skin: no rashes or lesions Lines: Right femoral HD catheter, left femoral arterial line, right subclavian central venous catheter   Resolved Hospital Problem list     Assessment & Plan:  Cardiac arrest -Unknown etiology, pt has hx of combined systolic and diastolic CHF, NSVT, prolonged QT, and polysubstance abuse  Cardiogenic shock -~15 minutes downtime, witnessed arrest with quick initiation  of bystander CPR P: S/p TTM goal 33 degrees Central line and arterial line been placed in the groin Vasopressor support, weaning norepinephrine as able.  Appreciate cardiology's input   Acute hypoxic respiratory failure Emergent cricothyrotomy   -In the setting of post cardiac arrest  P:  Full ventilatory support Vent bundle Lung protective ventilation, decrease tidal volume to 6 cc/kg Will discuss tracheostomy with ENT as neurologic function improves.    New onset A-fib HX NSVT HX Prolonged QT -EKG on admission with A-fib and prolonged QT P: Continuous telemetry  Rate control as able, however will attempt to just wean norepinephrine right now. TSH is 7, not suppressed. Cardiology consulted   On heparin drip  Chronic combined systolic and diastolic congestive heart failure Nonischemic cardiomyopathy  -ECHO 11/10/18 with EF of 25-30% and grade 2 diastolicdysfunction  -Right and left heart cath 02/25/2016 with no angiographic evidence of CAD P: Cardiology consult Holding antihypertensives 2D echo  Acute encephalopathy Probably some component of delirium and anoxic encephalopathy from cardiac arrest Likely still delirious, but following commands Awaiting full neurologic recovery Minimizing sedation Delirium precautions   ESRD -Undergoing iHD at time of arrest, assuming regular schedule is MWF P: Nephrology is following Continue CRRT, will aim for net negative fluid balance.  Will discuss with nephrology.  Hypertension -Home medications includes Coreg and Imdur  P: [place] currently in sinus requiring vasopressors May need to resume oral antihypertensives in the future  Polysubstance abuse -Hx of cocaine, alcohol, and tobacco abuse P: PAD protocol   Best practice:  Diet: tube feeds when possible- don't have a pump, may need to start bolus Pain/Anxiety/Delirium protocol (if indicated): fentanyl prn VAP protocol (if indicated): yes, ordered DVT prophylaxis: heparin gtt GI prophylaxis: PPI Glucose control: yes, on D5 until he starts TF Foley: none Mobility: bed rest Code Status: Full Family Communication: mom and dad - will update.  Disposition: needs ICU  Labs   CBC: Recent Labs  Lab 11/16/19 1116 11/16/19 1238  11/17/19 0500   11/18/19 0048 11/18/19 0250 11/18/19 0547 11/19/19 0321 11/19/19 0402  WBC 8.8 10.0  --  11.1*  --   --   --   --   --  7.0  NEUTROABS 5.6  --   --   --   --   --   --   --   --  6.1  HGB 13.1 12.4*   < > 12.2*   < > 12.9* 13.3 12.6* 12.2* 11.3*  HCT 42.5 34.0*   < > 37.0*   < > 38.0* 39.0 37.0* 36.0* 35.5*  MCV 100.2* 98.6  --  93.0  --   --   --   --   --  96.7  PLT 170 178  --  125*  --   --   --   --   --  125*   < > = values in this interval not displayed.    Basic Metabolic Panel: Recent Labs  Lab 11/16/19 1116  11/17/19 0500  11/18/19 0250 11/18/19 0306 11/18/19 0547 11/18/19 1453 11/18/19 1619 11/19/19 0312 11/19/19 0321  NA 140   < > 136   < > 138 138 138  --  136 135 135  K 3.4*   < > 4.0   < > 3.9 4.1 3.8  --  4.9 4.9 4.8  CL 100   < > 100   < > 103 101 104  --  102 101  --   CO2  23  --  21*  --   --  22  --   --  22 22  --   GLUCOSE 117*   < > 110*   < > 79 83 94  --  79 92  --   BUN 23*   < > 37*   < > 38* 42* 37*  --  38* 25*  --   CREATININE 7.01*   < > 8.39*   < > 8.60* 8.77* 9.00*  --  7.01* 4.45*  --   CALCIUM 9.6  --  8.4*  --   --  8.1*  --   --  8.0* 7.9*  --   MG  --    < > 2.0  --   --  1.8  --  1.9 2.0 2.2  --   PHOS  --    < > 5.2*  --   --  5.9*  --  5.8* 5.6*  5.1* 3.9  4.0  --    < > = values in this interval not displayed.   GFR: Estimated Creatinine Clearance: 20.6 mL/min (A) (by C-G formula based on SCr of 4.45 mg/dL (H)). Recent Labs  Lab 11/16/19 1101 11/16/19 1116 11/16/19 1238 11/16/19 1346 11/16/19 1357 11/16/19 1740 11/16/19 2005 11/17/19 0500 11/18/19 0306 11/19/19 0402  PROCALCITON  --   --   --   --  0.69  --   --  24.16 31.62  --   WBC  --  8.8 10.0  --   --   --   --  11.1*  --  7.0  LATICACIDVEN 5.9*  --   --  4.4*  --  1.2 1.3  --   --   --     Liver Function Tests: Recent Labs  Lab 11/16/19 1116 11/18/19 1619 11/19/19 0312  AST 30  --   --   ALT 17  --   --   ALKPHOS 98  --   --   BILITOT 1.2  --   --    PROT 7.7  --   --   ALBUMIN 3.5 3.1* 3.0*   No results for input(s): LIPASE, AMYLASE in the last 168 hours. No results for input(s): AMMONIA in the last 168 hours.  ABG    Component Value Date/Time   PHART 7.268 (L) 11/19/2019 0321   PCO2ART 52.4 (H) 11/19/2019 0321   PO2ART 62.0 (L) 11/19/2019 0321   HCO3 24.0 11/19/2019 0321   TCO2 26 11/19/2019 0321   ACIDBASEDEF 3.0 (H) 11/19/2019 0321   O2SAT 88.0 11/19/2019 0321     Coagulation Profile: Recent Labs  Lab 11/16/19 1238 11/16/19 2005  INR 1.5* 1.6*    Cardiac Enzymes: No results for input(s): CKTOTAL, CKMB, CKMBINDEX, TROPONINI in the last 168 hours.  HbA1C: Hgb A1c MFr Bld  Date/Time Value Ref Range Status  03/10/2011 12:02 PM (H) <5.7 % Final   5.7 (NOTE)                                                                       According to the ADA Clinical Practice Recommendations for 2011, when HbA1c is used as a screening test:   >=6.5%   Diagnostic of Diabetes  Mellitus           (if abnormal result  is confirmed)  5.7-6.4%   Increased risk of developing Diabetes Mellitus  References:Diagnosis and Classification of Diabetes Mellitus,Diabetes BPPH,4327,61(YJWLK 1):S62-S69 and Standards of Medical Care in         Diabetes - 2011,Diabetes HVFM,7340,37  (Suppl 1):S11-S61.    CBG: Recent Labs  Lab 11/18/19 1614 11/18/19 1945 11/18/19 2016 11/19/19 0826 11/19/19 1134  GLUCAP 73 76 44* 103* 106*    The patient is critically ill with multiple organ systems failure and requires high complexity decision making for assessment and support, frequent evaluation and titration of therapies, application of advanced monitoring technologies and extensive interpretation of multiple databases.   Critical Care Time devoted to patient care services described in this note is 42 minutes. This time reflects time of care of this [place] . This critical care time does not reflect separately billable procedures or procedure  time, teaching time or supervisory time of PA/NP/Med student/Med Resident etc but could involve care discussion time.  [facility] 11/19/2019 1:40 PM  Pager: [phone] After hours pager: [phone]

## 2019-11-19 NOTE — Progress Notes (Signed)
Wasted 65ml of Versed with Production assistant, radio

## 2019-11-19 NOTE — Progress Notes (Signed)
ANTICOAGULATION CONSULT NOTE   Pharmacy Consult for Heparin Indication: atrial fibrillation and pulmonary embolus  Patient Measurements: Height: 5' 10.5" (179.1 cm) Weight: 176 lb 5.9 oz (80 kg) IBW/kg (Calculated) : 74.15  Vital Signs: Temp: 98.2 F (36.8 C) (12/05 1400) Temp Source: Rectal (12/05 1200) BP: 77/67 (12/05 1400) Pulse Rate: 107 (12/05 1132)  Labs: Recent Labs    11/16/19 1730  11/16/19 2005  11/17/19 0500  11/18/19 0547 11/18/19 1619 11/19/19 0312 11/19/19 0321 11/19/19 0402 11/19/19 1039  HGB  --    < >  --    < > 12.2*   < > 12.6*  --   --  12.2* 11.3*  --   HCT  --    < >  --    < > 37.0*   < > 37.0*  --   --  36.0* 35.5*  --   PLT  --   --   --   --  125*  --   --   --   --   --  125*  --   APTT  --   --  51*  --   --   --   --   --   --   --   --   --   LABPROT  --   --  19.2*  --   --   --   --   --   --   --   --   --   INR  --   --  1.6*  --   --   --   --   --   --   --   --   --   HEPARINUNFRC  --   --   --   --   --    < >  --  0.34 0.24*  --   --  0.21*  CREATININE  --    < >  --    < > 8.39*   < > 9.00* 7.01* 4.45*  --   --   --   TROPONINIHS 679*  --   --   --   --   --   --   --   --   --   --   --    < > = values in this interval not displayed.    Estimated Creatinine Clearance: 20.6 mL/min (A) (by C-G formula based on SCr of 4.45 mg/dL (H)).  Assessment: 53 yo male with new onset afib and possible PE for heparin  12/5 pm update:  Heparin level still below goal - 0.2 No current issues with bleeding per RN, no epistaxis noted, some oozing this am around HD site but now resolved.   Goal of Therapy:  Heparin level 0.3-0.7 units/ml (targeting lower end of range, due to episodic epistaxis) Monitor platelets by anticoagulation protocol: Yes   Plan: Increase heparin to 1200 units/hr Check 8-hr heparin level Monitor daily heparin level, CBC Monitor for signs/symptoms of bleedings  Erin Hearing PharmD., BCPS Clinical  Pharmacist 11/19/2019 2:19 PM

## 2019-11-19 NOTE — Progress Notes (Signed)
ANTICOAGULATION CONSULT NOTE   Pharmacy Consult for Heparin Indication: atrial fibrillation and pulmonary embolus  Patient Measurements: Height: 5' 10.5" (179.1 cm) Weight: 176 lb 5.9 oz (80 kg) IBW/kg (Calculated) : 74.15  Vital Signs: Temp: 98.8 F (37.1 C) (12/05 0400) Temp Source: Rectal (12/05 0000) BP: 88/56 (12/05 0400) Pulse Rate: 126 (12/05 0400)  Labs: Recent Labs    11/16/19 1141 11/16/19 1238  11/16/19 1730  11/16/19 2005  11/17/19 0500  11/18/19 0305 11/18/19 0306 11/18/19 0547 11/18/19 1619 11/19/19 0312 11/19/19 0321 11/19/19 0402  HGB  --  12.4*   < >  --    < >  --    < > 12.2*   < >  --   --  12.6*  --   --  12.2* 11.3*  HCT  --  34.0*   < >  --    < >  --    < > 37.0*   < >  --   --  37.0*  --   --  36.0* 35.5*  PLT  --  178  --   --   --   --   --  125*  --   --   --   --   --   --   --  125*  APTT  --  37*  --   --   --  51*  --   --   --   --   --   --   --   --   --   --   LABPROT  --  17.5*  --   --   --  19.2*  --   --   --   --   --   --   --   --   --   --   INR  --  1.5*  --   --   --  1.6*  --   --   --   --   --   --   --   --   --   --   HEPARINUNFRC  --   --   --   --   --   --   --   --    < > 0.18*  --   --  0.34 0.24*  --   --   CREATININE  --   --   --   --    < >  --    < > 8.39*   < >  --  8.77* 9.00* 7.01*  --   --   --   TROPONINIHS 171* 243*  --  679*  --   --   --   --   --   --   --   --   --   --   --   --    < > = values in this interval not displayed.    Estimated Creatinine Clearance: 13.1 mL/min (A) (by C-G formula based on SCr of 7.01 mg/dL (H)).  Assessment: 53 yo male with new onset afib and possible PE for heparin  Heparin infusion at 950 units/hr was stopped earlier today for placement of catheter for CRRT and was restarted at same rate at 12:37 PM today. Heparin level drawn at 16:19 PM (~3.5 hrs after infusion restarted) was 0.34 units/ml, which is within goal range. H/H, platelets stable. Per RN, pt continues to  have episodes of epistaxis, but  seems to be getting better  12/5 AM update:  Heparin level just below goal No current issues with bleeding per RN  Goal of Therapy:  Heparin level 0.3-0.7 units/ml (targeting lower end of range, due to episodic epistaxis) Monitor platelets by anticoagulation protocol: Yes   Plan: Inc heparin to 1050 units/hr Check 8-hr heparin level Monitor daily heparin level, CBC Monitor for signs/symptoms of bleedings  Narda Bonds, PharmD, BCPS Clinical Pharmacist Phone: [phone]

## 2019-11-19 NOTE — Progress Notes (Signed)
eLink Physician-Brief Progress Note Patient Name: [name] [name] DOB: [birth year] MRN: [id number]   Date of Service  11/19/2019  HPI/Events of Note  ABG 7.26/52/63 on PRVC 14/590/40%/5 PEEP not breathing over the set rate  eICU Interventions  Increase rate to 20     Intervention Category Major Interventions: Respiratory failure - evaluation and management  Judd Lien 11/19/2019, 3:30 AM

## 2019-11-19 NOTE — Procedures (Addendum)
Patient Name:[name] [name] CCE:[id number] Epilepsy Attending:Faelynn Wynder Barbra Sarks Referring Conway, NP Duration:11/18/2019 2024 to12/04/2019 65  Patient history:53 year old male status post cardiac arrest on TTM. EEG to evaluate for seizures.  Level of alertness:Comatose/sedated  AEDs during EEG study:None  Technical aspects: This EEG study was done with scalp electrodes positioned according to the 10-20 International system of electrode placement. Electrical activity was acquired at a sampling rate of 500Hz  and reviewed with a high frequency filter of 70Hz  and a low frequency filter of 1Hz . EEG data were recorded continuously and digitally stored.  Description:EEG showed continuous generalized mixed 8 to 13 Hz alpha-beta frequency as well as generalized 5 to 6 Hz theta frequency. EEG was reactive to tactile stimulation. Hyperventilation and photic stimulation were not performed.   Abnormality - Intermittent slow, generalized - Generalized alpha activity  IMPRESSION: This study issuggestive of severe diffuse encephalopathy, non specific to etiology.No seizures or epileptiform discharges were seen throughout the recording.

## 2019-11-19 NOTE — Progress Notes (Signed)
Spoke cards fellow @ [phone] 0n 11/18/19 concerning continued afib in the 120-130s, no orders given will continue to follow

## 2019-11-19 NOTE — Progress Notes (Signed)
[facility] ROUNDING NOTE   Subjective:   Is a 53 year old gentleman history of SVT systolic heart failure polysubstance abuse end-stage renal disease Monday Wednesday Friday dialysis [facility] status post PEA arrest 11/16/2019.  Occurred about 3-1/2 hours into hemodialysis treatment received 3 rounds of epinephrine 1 g of calcium 50 mEq of bicarbonate with ROSC of about 15 minutes.  Emergent cricothyroitomy performed by EMS due to failed intubation.  EEG revealed profound diffuse encephalopathy nonspecific to etiology with no seizure or epileptiform discharges.  Blood pressure 126/69.  A-line pulse 116 temperature 98.8 O2 sats 100% FiO2 50  IV norepinephrine IV heparin IV Protonix  Sodium 135 potassium 4.9 chloride 101 CO2 22 BUN 25 creatinine 4.45 glucose 92 phosphorus 4.0 calcium 7.9 magnesium 2.2 albumin 3.0 WBC 7.0 hemoglobin 11.3 platelets 125   CRRT initiated 11/18/2019      Objective:  Vital signs in last 24 hours:  Temp:  [97.7 F (36.5 C)-99 F (37.2 C)] 98.8 F (37.1 C) (12/05 0400) Pulse Rate:  [62-127] 62 (12/05 0600) Resp:  [7-31] 20 (12/05 0700) BP: (83-115)/(47-87) 115/87 (12/05 0700) SpO2:  [96 %-100 %] 100 % (12/05 0700) Arterial Line BP: (85-130)/(46-79) 123/76 (12/05 0700) FiO2 (%):  [40 %-50 %] 50 % (12/05 0500)  Weight change:  Filed Weights   11/16/19 1300  Weight: 80 kg    Intake/Output: I/O last 3 completed shifts: In: 3019.9 [I.V.:2689.8; NG/GT:30; IV Piggyback:300.1] Out: 1884 [Other:1884]   Intake/Output this shift:  Total I/O In: 99.3 [I.V.:99.3] Out: -   Cricothyrotomy, sedated, paralyzed LUE AVF +B/T Brady, nl s1s2 Coarse bs b/l S/mild distension No LEE   Basic Metabolic Panel: Recent Labs  Lab 11/16/19 1116  11/17/19 0500  11/18/19 0250 11/18/19 0306 11/18/19 0547 11/18/19 1453 11/18/19 1619 11/19/19 0312 11/19/19 0321  NA 140   < > 136   < > 138 138 138  --  136 135 135  K 3.4*   <  > 4.0   < > 3.9 4.1 3.8  --  4.9 4.9 4.8  CL 100   < > 100   < > 103 101 104  --  102 101  --   CO2 23  --  21*  --   --  22  --   --  22 22  --   GLUCOSE 117*   < > 110*   < > 79 83 94  --  79 92  --   BUN 23*   < > 37*   < > 38* 42* 37*  --  38* 25*  --   CREATININE 7.01*   < > 8.39*   < > 8.60* 8.77* 9.00*  --  7.01* 4.45*  --   CALCIUM 9.6  --  8.4*  --   --  8.1*  --   --  8.0* 7.9*  --   MG  --    < > 2.0  --   --  1.8  --  1.9 2.0 2.2  --   PHOS  --    < > 5.2*  --   --  5.9*  --  5.8* 5.6*  5.1* 3.9  4.0  --    < > = values in this interval not displayed.    Liver Function Tests: Recent Labs  Lab 11/16/19 1116 11/18/19 1619 11/19/19 0312  AST 30  --   --   ALT 17  --   --   ALKPHOS 98  --   --  BILITOT 1.2  --   --   PROT 7.7  --   --   ALBUMIN 3.5 3.1* 3.0*   No results for input(s): LIPASE, AMYLASE in the last 168 hours. No results for input(s): AMMONIA in the last 168 hours.  CBC: Recent Labs  Lab 11/16/19 1116 11/16/19 1238  11/17/19 0500  11/18/19 0048 11/18/19 0250 11/18/19 0547 11/19/19 0321 11/19/19 0402  WBC 8.8 10.0  --  11.1*  --   --   --   --   --  7.0  NEUTROABS 5.6  --   --   --   --   --   --   --   --  6.1  HGB 13.1 12.4*   < > 12.2*   < > 12.9* 13.3 12.6* 12.2* 11.3*  HCT 42.5 34.0*   < > 37.0*   < > 38.0* 39.0 37.0* 36.0* 35.5*  MCV 100.2* 98.6  --  93.0  --   --   --   --   --  96.7  PLT 170 178  --  125*  --   --   --   --   --  125*   < > = values in this interval not displayed.    Cardiac Enzymes: No results for input(s): CKTOTAL, CKMB, CKMBINDEX, TROPONINI in the last 168 hours.  BNP: Invalid input(s): POCBNP  CBG: Recent Labs  Lab 11/18/19 1134 11/18/19 1233 11/18/19 1614 11/18/19 1945 11/18/19 2016  GLUCAP 66* 87 73 94 44*    Microbiology: Results for orders placed or performed during the hospital encounter of 11/16/19  SARS Coronavirus 2 by RT PCR (hospital order, performed in [facility] hospital lab)  Nasopharyngeal Nasopharyngeal Swab     Status: None   Collection Time: 11/16/19 11:41 AM   Specimen: Nasopharyngeal Swab  Result Value Ref Range Status   SARS Coronavirus 2 NEGATIVE NEGATIVE Final    Comment: (NOTE) SARS-CoV-2 target nucleic acids are NOT DETECTED. The SARS-CoV-2 RNA is generally detectable in upper and lower respiratory specimens during the acute phase of infection. The lowest concentration of SARS-CoV-2 viral copies this assay can detect is 250 copies / mL. A negative result does not preclude SARS-CoV-2 infection and should not be used as the sole basis for treatment or other patient management decisions.  A negative result may occur with improper specimen collection / handling, submission of specimen other than nasopharyngeal swab, presence of viral mutation(s) within the areas targeted by this assay, and inadequate number of viral copies (<250 copies / mL). A negative result must be combined with clinical observations, patient history, and epidemiological information. Fact Sheet for Patients:   StrictlyIdeas.no Fact Sheet for Healthcare Providers: BankingDealers.co.za This test is not yet approved or cleared  by the [place] FDA and has been authorized for detection and/or diagnosis of SARS-CoV-2 by FDA under an Emergency Use Authorization (EUA).  This EUA will remain in effect (meaning this test can be used) for the duration of the COVID-19 declaration under Section 564(b)(1) of the Act, 21 U.S.C. section 360bbb-3(b)(1), unless the authorization is terminated or revoked sooner. Performed at [facility], [place] [address] [place], [place] [address]   Culture, blood (routine x 2)     Status: None (Preliminary result)   Collection Time: 11/16/19  6:03 PM   Specimen: Artery-; Blood  Result Value Ref Range Status   Specimen Description BLOOD RIGHT HAND  Final   Special Requests   Final  BOTTLES DRAWN  AEROBIC AND ANAEROBIC Blood Culture results may not be optimal due to an inadequate volume of blood received in culture bottles   Culture   Final    NO GROWTH 2 DAYS Performed at [facility], [place] [address], [place], [place] [address]    Report Status PENDING  Incomplete  MRSA PCR Screening     Status: None   Collection Time: 11/16/19  6:05 PM   Specimen: Nasal Mucosa; Nasopharyngeal  Result Value Ref Range Status   MRSA by PCR NEGATIVE NEGATIVE Final    Comment:        The GeneXpert MRSA Assay (FDA approved for NASAL specimens only), is one component of a comprehensive MRSA colonization surveillance program. It is not intended to diagnose MRSA infection nor to guide or monitor treatment for MRSA infections. Performed at [facility], [place] [address], [place], [place] [address]   Culture, blood (routine x 2)     Status: None (Preliminary result)   Collection Time: 11/16/19  6:08 PM   Specimen: Artery-; Blood  Result Value Ref Range Status   Specimen Description BLOOD RIGHT HAND  Final   Special Requests   Final    BOTTLES DRAWN AEROBIC ONLY Blood Culture results may not be optimal due to an inadequate volume of blood received in culture bottles   Culture   Final    NO GROWTH 2 DAYS Performed at [facility], [place] [address], [place], [place] [address]    Report Status PENDING  Incomplete  Culture, respiratory (non-expectorated)     Status: None (Preliminary result)   Collection Time: 11/18/19 12:28 PM   Specimen: Tracheal Aspirate; Respiratory  Result Value Ref Range Status   Specimen Description TRACHEAL ASPIRATE  Final   Special Requests NONE  Final   Gram Stain   Final    ABUNDANT WBC PRESENT,BOTH PMN AND MONONUCLEAR ABUNDANT GRAM POSITIVE COCCI ABUNDANT GRAM NEGATIVE RODS FEW GRAM VARIABLE ROD Performed at [facility], [place] [address] [place], [place] [address]    Culture PENDING  Incomplete   Report Status PENDING  Incomplete     Coagulation Studies: Recent Labs    11/16/19 1238 11/16/19 2005  LABPROT 17.5* 19.2*  INR 1.5* 1.6*    Urinalysis: No results for input(s): COLORURINE, LABSPEC, PHURINE, GLUCOSEU, HGBUR, BILIRUBINUR, KETONESUR, PROTEINUR, UROBILINOGEN, NITRITE, LEUKOCYTESUR in the last 72 hours.  Invalid input(s): APPERANCEUR    Imaging: Am Dg Chest Port 1 View  Result Date: 11/18/2019 CLINICAL DATA:  Cardiac arrest. EXAM: PORTABLE CHEST 1 VIEW COMPARISON:  11/17/2019 FINDINGS: Tracheostomy tube in adequate position. Right subclavian central venous catheter with tip over the SVC. Lungs are adequately inflated demonstrate moderate stable hazy bilateral perihilar opacification likely moderate interstitial edema. Possible small amount right pleural fluid and basilar atelectasis. Stable cardiomegaly. Remainder of the exam is unchanged. IMPRESSION: 1. Stable cardiomegaly with evidence of stable moderate interstitial edema and possible small right effusion/atelectasis. 2.  Tubes and lines as described. Electronically Signed   By: Marin Olp M.D.   On: 11/18/2019 08:20   Dg Chest Port 1 View  Result Date: 11/17/2019 CLINICAL DATA:  Respiratory failure. EXAM: PORTABLE CHEST 1 VIEW COMPARISON:  11/16/2019 FINDINGS: Tracheostomy tube and central venous catheter appear in good position, unchanged. Persistent cardiomegaly. The bilateral pulmonary infiltrates have improved. However, there is increased density at the lung bases, likely representing increased effusions. No other change. IMPRESSION: 1. Increased bilateral pleural effusions. 2. Improved bilateral pulmonary  infiltrates. 3. No other change. 4. Stable cardiomegaly. Electronically Signed   By: Lorriane Shire M.D.   On: 11/17/2019 12:10     Medications:   .  prismasol BGK 4/2.5 500 mL/hr at 11/18/19 2132  .  prismasol BGK 4/2.5 500 mL/hr at 11/18/19 2132  . sodium chloride Stopped (11/17/19 0836)  . ampicillin-sulbactam (UNASYN) IV Stopped  (11/19/19 0558)  . dextrose 50 mL/hr at 11/19/19 0800  . feeding supplement (VITAL AF 1.2 CAL)    . fentaNYL infusion INTRAVENOUS 100 mcg/hr (11/19/19 0800)  . heparin 1,050 Units/hr (11/19/19 0800)  . midazolam Stopped (11/18/19 1434)  . norepinephrine (LEVOPHED) Adult infusion 26 mcg/min (11/19/19 0800)  . prismasol BGK 4/2.5 2,000 mL/hr at 11/19/19 0531   . artificial tears  1 application Both Eyes Z7H  . B-complex with vitamin C  1 tablet Per Tube Daily  . chlorhexidine gluconate (MEDLINE KIT)  15 mL Mouth Rinse BID  . Chlorhexidine Gluconate Cloth  6 each Topical Daily  . feeding supplement (PRO-STAT SUGAR FREE 64)  30 mL Per Tube TID  . fentaNYL (SUBLIMAZE) injection  100 mcg Intravenous Once  . mouth rinse  15 mL Mouth Rinse 10 times per day  . midazolam  2 mg Intravenous Once  . pantoprazole (PROTONIX) IV  40 mg Intravenous QHS  . Thrombi-Pad  1 each Topical Once   acetaminophen, fentaNYL, heparin, midazolam, sodium chloride   HD Rx: [facility] MWF, 4h 60mn; 2/2.5 bath; 450/1.5; F180; UFP 4; IVB Hep 5 k qTx; C3 0.75 qTx; no ESA/Fe; Hb 12s usually  Assessment/ Plan:   [facility] Wednesday [facility]  Patient initiated on CRRT 11/19/2019.  Being kept even with no fluid removal.  Pre and post filter replacement fluids 500 cc an hour.  Composition of fluids and dialysate 4 mEq potassium 2.5 mEq calcium  Status post PEA arrest.  Appreciate assistance from Dr. [id number] 2 at hemodialysis prolonged period of ROSC.  11/17/2019 2D echo less than 20% ejection fraction.  New onset atrial fibrillation CHA2DS2-VASc score 3 IV heparin and IV amiodarone  Chronic combined congestive heart failure EF 20% not a candidate for ICD due to noncompliance and drug abuse  Prolonged QT interval  Cardiogenic shock hypotensive pressor support  ANEMIA-no major issue  MBD-severe chronic hyperphosphatemia  ACCESS-AV fistula  Diffuse nonspecific encephalopathy EEG  performed appreciate assistance from Dr. YHortense Ramal Emergent cricothyroidotomy  acute respiratory failure   History of polysubstance abuse   LOS: 3[place]_0 _1 :18 AM

## 2019-11-20 ENCOUNTER — Inpatient Hospital Stay (HOSPITAL_COMMUNITY): Payer: Medicare Other

## 2019-11-20 DIAGNOSIS — J9601 Acute respiratory failure with hypoxia: Secondary | ICD-10-CM | POA: Diagnosis not present

## 2019-11-20 DIAGNOSIS — I480 Paroxysmal atrial fibrillation: Secondary | ICD-10-CM

## 2019-11-20 DIAGNOSIS — R570 Cardiogenic shock: Secondary | ICD-10-CM

## 2019-11-20 DIAGNOSIS — I469 Cardiac arrest, cause unspecified: Secondary | ICD-10-CM | POA: Diagnosis not present

## 2019-11-20 DIAGNOSIS — I428 Other cardiomyopathies: Secondary | ICD-10-CM

## 2019-11-20 DIAGNOSIS — G9340 Encephalopathy, unspecified: Secondary | ICD-10-CM

## 2019-11-20 DIAGNOSIS — N186 End stage renal disease: Secondary | ICD-10-CM | POA: Diagnosis not present

## 2019-11-20 LAB — RENAL FUNCTION PANEL
Albumin: 3 g/dL — ABNORMAL LOW (ref 3.5–5.0)
Albumin: 2.8 g/dL — ABNORMAL LOW (ref 3.5–5.0)
Anion gap: 12 (ref 5–15)
Anion gap: 12 (ref 5–15)
BUN: 19 mg/dL (ref 6–20)
BUN: 20 mg/dL (ref 6–20)
CO2: 20 mmol/L — ABNORMAL LOW (ref 22–32)
CO2: 21 mmol/L — ABNORMAL LOW (ref 22–32)
Calcium: 8.4 mg/dL — ABNORMAL LOW (ref 8.9–10.3)
Calcium: 8.2 mg/dL — ABNORMAL LOW (ref 8.9–10.3)
Chloride: 103 mmol/L (ref 98–111)
Chloride: 102 mmol/L (ref 98–111)
Creatinine, Ser: 2.35 mg/dL — ABNORMAL HIGH (ref 0.61–1.24)
Creatinine, Ser: 2 mg/dL — ABNORMAL HIGH (ref 0.61–1.24)
GFR calc Af Amer: 36 mL/min — ABNORMAL LOW (ref >60)
GFR calc Af Amer: 43 mL/min — ABNORMAL LOW (ref >60)
GFR calc non Af Amer: 31 mL/min — ABNORMAL LOW (ref >60)
GFR calc non Af Amer: 38 mL/min — ABNORMAL LOW (ref >60)
Glucose, Bld: 146 mg/dL — ABNORMAL HIGH (ref 70–99)
Glucose, Bld: 100 mg/dL — ABNORMAL HIGH (ref 70–99)
Phosphorus: 2.2 mg/dL — ABNORMAL LOW (ref 2.5–4.6)
Phosphorus: 1.9 mg/dL — ABNORMAL LOW (ref 2.5–4.6)
Potassium: 4.4 mmol/L (ref 3.5–5.1)
Potassium: 4.4 mmol/L (ref 3.5–5.1)
Sodium: 135 mmol/L (ref 135–145)
Sodium: 135 mmol/L (ref 135–145)

## 2019-11-20 LAB — CBC WITH DIFFERENTIAL/PLATELET
Abs Immature Granulocytes: 0 10*3/uL (ref 0.00–0.07)
Band Neutrophils: 22 %
Basophils Absolute: 0 10*3/uL (ref 0.0–0.1)
Basophils Relative: 0 %
Eosinophils Absolute: 0 10*3/uL (ref 0.0–0.5)
Eosinophils Relative: 0 %
HCT: 32.6 % — ABNORMAL LOW (ref 39.0–52.0)
Hemoglobin: 10.5 g/dL — ABNORMAL LOW (ref 13.0–17.0)
Lymphocytes Relative: 2 %
Lymphs Abs: 0.2 10*3/uL — ABNORMAL LOW (ref 0.7–4.0)
MCH: 30.7 pg (ref 26.0–34.0)
MCHC: 32.2 g/dL (ref 30.0–36.0)
MCV: 95.3 fL (ref 80.0–100.0)
Monocytes Absolute: 0.2 10*3/uL (ref 0.1–1.0)
Monocytes Relative: 3 %
Neutro Abs: 7.7 10*3/uL (ref 1.7–7.7)
Neutrophils Relative %: 73 %
Platelets: 110 10*3/uL — ABNORMAL LOW (ref 150–400)
RBC: 3.42 MIL/uL — ABNORMAL LOW (ref 4.22–5.81)
RDW: 16.3 % — ABNORMAL HIGH (ref 11.5–15.5)
WBC: 8.1 10*3/uL (ref 4.0–10.5)
nRBC: 0.6 % — ABNORMAL HIGH (ref 0.0–0.2)
nRBC: 1 /100 WBC — ABNORMAL HIGH

## 2019-11-20 LAB — POCT I-STAT 7, (LYTES, BLD GAS, ICA,H+H)
Acid-base deficit: 3 mmol/L — ABNORMAL HIGH (ref 0.0–2.0)
Acid-base deficit: 4 mmol/L — ABNORMAL HIGH (ref 0.0–2.0)
Bicarbonate: 22.6 mmol/L (ref 20.0–28.0)
Bicarbonate: 23.5 mmol/L (ref 20.0–28.0)
Calcium, Ion: 1.09 mmol/L — ABNORMAL LOW (ref 1.15–1.40)
Calcium, Ion: 1.11 mmol/L — ABNORMAL LOW (ref 1.15–1.40)
HCT: 34 % — ABNORMAL LOW (ref 39.0–52.0)
HCT: 35 % — ABNORMAL LOW (ref 39.0–52.0)
Hemoglobin: 11.6 g/dL — ABNORMAL LOW (ref 13.0–17.0)
Hemoglobin: 11.9 g/dL — ABNORMAL LOW (ref 13.0–17.0)
O2 Saturation: 93 %
O2 Saturation: 98 %
Patient temperature: 36.9
Patient temperature: 37.3
Potassium: 4.3 mmol/L (ref 3.5–5.1)
Potassium: 4.5 mmol/L (ref 3.5–5.1)
Sodium: 136 mmol/L (ref 135–145)
Sodium: 137 mmol/L (ref 135–145)
TCO2: 24 mmol/L (ref 22–32)
TCO2: 25 mmol/L (ref 22–32)
pCO2 arterial: 40.9 mmHg (ref 32.0–48.0)
pCO2 arterial: 50.3 mmHg — ABNORMAL HIGH (ref 32.0–48.0)
pH, Arterial: 7.278 — ABNORMAL LOW (ref 7.350–7.450)
pH, Arterial: 7.351 (ref 7.350–7.450)
pO2, Arterial: 105 mmHg (ref 83.0–108.0)
pO2, Arterial: 78 mmHg — ABNORMAL LOW (ref 83.0–108.0)

## 2019-11-20 LAB — CULTURE, RESPIRATORY W GRAM STAIN

## 2019-11-20 LAB — GLUCOSE, CAPILLARY
Glucose-Capillary: 179 mg/dL — ABNORMAL HIGH (ref 70–99)
Glucose-Capillary: 105 mg/dL — ABNORMAL HIGH (ref 70–99)
Glucose-Capillary: 146 mg/dL — ABNORMAL HIGH (ref 70–99)
Glucose-Capillary: 141 mg/dL — ABNORMAL HIGH (ref 70–99)
Glucose-Capillary: 95 mg/dL (ref 70–99)
Glucose-Capillary: 92 mg/dL (ref 70–99)

## 2019-11-20 LAB — MAGNESIUM: Magnesium: 2.5 mg/dL — ABNORMAL HIGH (ref 1.7–2.4)

## 2019-11-20 LAB — HEPARIN LEVEL (UNFRACTIONATED): Heparin Unfractionated: 0.3 IU/mL (ref 0.30–0.70)

## 2019-11-20 MED ORDER — SODIUM CHLORIDE 0.9 % IV SOLN
INTRAVENOUS | Status: DC | PRN
  Administered 2019-11-20 – 2019-11-22 (×3): via INTRAVENOUS

## 2019-11-20 MED ORDER — AMIODARONE HCL 200 MG PO TABS: 200.0000 mg | ORAL_TABLET | Freq: Two times a day (BID) | ORAL | Status: DC

## 2019-11-20 MED ORDER — AMIODARONE HCL 200 MG PO TABS
400.0000 mg | ORAL_TABLET | Freq: Two times a day (BID) | ORAL | Status: DC
  Administered 2019-11-20 – 2019-11-21 (×4): 400 mg
  Filled 2019-11-20 (×4): qty 2

## 2019-11-20 MED ORDER — AMIODARONE HCL 200 MG PO TABS: 400.0000 mg | ORAL_TABLET | Freq: Two times a day (BID) | ORAL | Status: DC

## 2019-11-20 MED ORDER — MIDODRINE HCL 5 MG PO TABS
5.0000 mg | ORAL_TABLET | Freq: Three times a day (TID) | ORAL | Status: DC
  Administered 2019-11-20 – 2019-11-23 (×9): 5 mg
  Filled 2019-11-20 (×11): qty 1

## 2019-11-20 NOTE — Progress Notes (Signed)
NAME:  [name] [name], MRN:  [id number], DOB:  1966/12/09, LOS: 4 ADMISSION DATE:  11/16/2019, CONSULTATION DATE:  12/2 REFERRING MD:  Dr. Sedonia Small, CHIEF COMPLAINT:  Post cardiac arrest    Brief History   53yo male presented post arrest which occurred during iHD treatment. This was a witnessed arrest with quick bystander CPR. Received 3 rounds of epi, 1g of calcium and 50 mEq of bicarb with ROSC after 15 minutes.   History of present illness   HPI obtained from medical chart review as patient is currently intubated on mechanical ventilation.  53 year old male with extensive PMH significant for but not limited to NSVT, combined systolic and diastolic CHF, polysubstance abuse, and  end-stage renal disease who presented to the ED after cardiac arrest.  Patient had presented to outpatient hemodialysis appointment 3-1/2 hours into HD treatment when patient suffered a witnessed cardiac arrest. On arrival to EMS patients rhythm was PEA.  Patient received 3 rounds of epi, 1g of calcium and 50 mEq of bicarb with ROSC after 15 minutes. Emergent cricothyrotomy performed by EMS due to failed intubation with [facility] airway.   Vital signs stable on admission, has remained in sinus rhythm since ROSC.  ABG on admission pH 7.227 / pCO2 65.7 / pO2 349.0 / bicarb 27.3. Troponin 171, creatinine 7.01 and  lactic acid 5.9.   PCCM consulted for admission   Past Medical History  NSVT 2017 Nonischemic cardiomyopathy Medical noncompliance Polysubstance abuse Hypertension End-stage renal disease Combined systolic and diastolic congestive heart failure Cocaine abuse Alcohol abuse Prolonged QTC  Significant Hospital Events   12/2 admitted post cardiac arrest  Consults:  Cardiology Nephrology  Procedures:  12/2 remeasuring cricothyrotomy per EMS  Significant Diagnostic Tests:  CXR 12/2 > Worsened right greater than left perihilar opacities most consistent with edema (versus atypical inflammation). Tip of the  tracheostomy tube in the proximal trachea, just below the thoracic inlet.  Micro Data:  COVID 12/2 > negative  Antimicrobials:    Interim history/subjective:   Unable to wean norepinephrine this morning.  Have been unable to take net negative fluid off. Patient is able to open his eye but the movements do not appear to be purposeful.   Objective   Blood pressure 113/60, pulse 87, temperature 98.8 F (37.1 C), temperature source Rectal, resp. rate (!) 25, height 5' 10.5" (1.791 m), weight 81.4 kg, SpO2 93 %. CVP:  [21 mmHg-30 mmHg] 30 mmHg  Vent Mode: PRVC FiO2 (%):  [50 %] 50 % Set Rate:  [20 bmp-25 bmp] 25 bmp Vt Set:  [440 mL-590 mL] 440 mL PEEP:  [5 cmH20] 5 cmH20 Plateau Pressure:  [17 cmH20-33 cmH20] 25 cmH20   Intake/Output Summary (Last 24 hours) at 11/20/2019 0832 Last data filed at 11/20/2019 0800 Gross per 24 hour  Intake 3289.35 ml  Output 4136 ml  Net -846.65 ml   Filed Weights   11/16/19 1300 11/20/19 0756  Weight: 80 kg 81.4 kg    Examination: General: Obese male vent to cric.  HEENT: Emergency cric is in place functioning well, Neuro: grimacing. Spontaneously moves eyes and lower extremities, no purposeful commands for me. CV: Heart sounds are regular, no mrg PULM: Coarse rhonchi GI: soft, bsx4 active  GU: None Extremities: warm/dry,  edema left bicep graft positive bruit Skin: no rashes or lesions Lines: Right femoral HD catheter, left femoral arterial line, right subclavian central venous catheter  Resolved Hospital Problem list     Assessment & Plan:  Cardiac arrest -Unknown etiology, pt  has hx of combined systolic and diastolic CHF, NSVT, prolonged QT, and polysubstance abuse  Cardiogenic shock -~15 minutes downtime, witnessed arrest with quick initiation of bystander CPR P: S/p TTM goal 33 degrees Central line and arterial line been placed in the groin Vasopressor support, weaning norepinephrine as able.  Appreciate cardiology's input  Will add midodrine for component of circulatory shock from ESRD.    Acute hypoxic respiratory failure Emergent cricothyrotomy   -In the setting of post cardiac arrest  P: Full ventilatory support Vent bundle Lung protective ventilation, decrease tidal volume to 6 cc/kg Will discuss tracheostomy with ENT as neurologic function improves.    New onset A-fib HX NSVT HX Prolonged QT -EKG on admission with A-fib and prolonged QT P: Continuous telemetry  Rate control as able, however will attempt to just wean norepinephrine right now. TSH is 7, not suppressed. Cardiology consulted   On heparin drip Will transition amiodarone to po as he has received a 4g load.   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 EEG shows profound diffuse encephalopathy Suspect ESRD status makes clearing sedation agents challenging  ESRD -Undergoing iHD at time of arrest, assuming regular schedule is MWF P: Nephrology is following Continue CRRT, will aim for net negative fluid balance as able  Polysubstance abuse -Hx of cocaine, alcohol, and tobacco abuse P: PAD protocol   Best practice:  Diet: tube feeds at goal Pain/Anxiety/Delirium protocol (if indicated): fentanyl prn VAP protocol (if indicated): yes, ordered DVT prophylaxis: heparin gtt GI prophylaxis: PPI Glucose control: yes less than 180 Foley: none Mobility: bed rest Code Status: Full Family Communication: mom and dad - will  update.  Disposition: needs ICU  Labs   CBC: Recent Labs  Lab 11/16/19 1116 11/16/19 1238  11/17/19 0500  11/19/19 0402 11/19/19 0828 11/20/19 0022 11/20/19 0439 11/20/19 0448  WBC 8.8 10.0  --  11.1*  --  7.0  --   --   --  8.1  NEUTROABS 5.6  --   --   --   --  6.1  --   --   --  7.7  HGB 13.1 12.4*   < > 12.2*   < > 11.3* 11.9* 11.6* 11.9* 10.5*  HCT 42.5 34.0*   < > 37.0*   < > 35.5* 35.0* 34.0* 35.0* 32.6*  MCV 100.2* 98.6  --  93.0  --  96.7  --   --   --  95.3  PLT 170 178  --  125*  --  125*  --   --   --  110*   < > = values in this interval not displayed.    Basic Metabolic Panel: Recent Labs  Lab 11/18/19 0306 11/18/19 0547 11/18/19 1453 11/18/19 1619 11/19/19 0312  11/19/19 0828 11/19/19 1453 11/20/19 0022 11/20/19 0439 11/20/19 0447  NA 138 138  --  136 135   < > 136 135 137 136 135  K 4.1 3.8  --  4.9 4.9   < > 4.6 4.3 4.3 4.5 4.4  CL 101 104  --  102 101  --   --  102  --   --  103  CO2 22  --   --  22 22  --   --  23  --   --  20*  GLUCOSE 83 94  --  79 92  --   --  112*  --   --  146*  BUN 42* 37*  --  38* 25*  --   --  19  --   --  19  CREATININE 8.77* 9.00*  --  7.01* 4.45*  --   --  3.03*  --   --  2.35*  CALCIUM 8.1*  --   --  8.0* 7.9*  --   --  8.1*  --   --  8.4*  MG 1.8  --  1.9 2.0 2.2  --   --  2.3  --   --  2.5*  PHOS 5.9*  --  5.8* 5.6*  5.1* 3.9  4.0  --   --  2.2*  --   --  2.2*   < > = values in this interval not displayed.   GFR: Estimated Creatinine Clearance: 39 mL/min (A) (by C-G formula based on SCr of 2.35 mg/dL (H)). Recent Labs  Lab 11/16/19 1101  11/16/19 1238 11/16/19 1346 11/16/19 1357 11/16/19 1740 11/16/19 2005 11/17/19 0500 11/18/19 0306 11/19/19 0402 11/20/19 0448  PROCALCITON  --   --   --   --  0.69  --   --  24.16 31.62  --   --   WBC  --    < > 10.0  --   --   --   --  11.1*  --  7.0 8.1  LATICACIDVEN 5.9*  --   --  4.4*  --  1.2 1.3  --   --   --   --    < > = values in this interval not displayed.     Liver Function Tests: Recent Labs  Lab 11/16/19 1116 11/18/19 1619 11/19/19 0312 11/19/19 1453 11/20/19 0447  AST 30  --   --   --   --   ALT 17  --   --   --   --   ALKPHOS 98  --   --   --   --   BILITOT 1.2  --   --   --   --   PROT 7.7  --   --   --   --   ALBUMIN 3.5 3.1* 3.0* 2.8* 3.0*   No results for input(s): LIPASE, AMYLASE in the last 168 hours. No results for input(s): AMMONIA in the last 168 hours.  ABG    Component Value Date/Time   PHART 7.351 11/20/2019 0439   PCO2ART 40.9 11/20/2019 0439   PO2ART 105.0 11/20/2019 0439   HCO3 22.6 11/20/2019 0439   TCO2 24 11/20/2019 0439   ACIDBASEDEF 3.0 (H) 11/20/2019 0439   O2SAT 98.0 11/20/2019 0439     Coagulation Profile: Recent Labs  Lab 11/16/19 1238 11/16/19 2005  INR 1.5* 1.6*    Cardiac Enzymes: No results for input(s): CKTOTAL, CKMB, CKMBINDEX, TROPONINI in the last 168 hours.  HbA1C: Hgb A1c MFr Bld  Date/Time Value Ref Range Status  03/10/2011 12:02 PM (H) <5.7 % Final   5.7 (NOTE)  According to the ADA Clinical Practice Recommendations for 2011, when HbA1c is used as a screening test:   >=6.5%   Diagnostic of Diabetes Mellitus           (if abnormal result  is confirmed)  5.7-6.4%   Increased risk of developing Diabetes Mellitus  References:Diagnosis and Classification of Diabetes Mellitus,Diabetes SVXB,9390,30(SPQZR 1):S62-S69 and Standards of Medical Care in         Diabetes - 2011,Diabetes AQTM,2263,33  (Suppl 1):S11-S61.    CBG: Recent Labs  Lab 11/19/19 1554 11/19/19 2024 11/19/19 2052 11/19/19 2350 11/20/19 0334  GLUCAP 117* 60* 179* 112* 105*    The patient is critically ill with multiple organ systems failure and requires high complexity decision making for assessment and support, frequent evaluation and titration of therapies, application of advanced monitoring technologies and extensive interpretation of  multiple databases.   Critical Care Time devoted to patient care services described in this note is 47 minutes. This time reflects time of care of this [place] . This critical care time does not reflect separately billable procedures or procedure time, teaching time or supervisory time of PA/NP/Med student/Med Resident etc but could involve care discussion time.  [facility] 11/20/2019 8:32 AM  Pager: [phone] After hours pager: [phone]

## 2019-11-20 NOTE — Progress Notes (Signed)
[facility] Progress Note Patient Name: [name] [name] DOB: 18-Jun-1966 MRN: [id number]   Date of Service  11/20/2019  HPI/Events of Note  ABG on 50%/PRVC 20/TV 440/P 5 = 7.278/50.3/78.0  eICU Interventions  Will order: 1. Increase PRVC rate to 25. 2. Repeat ABG at 5 AM.      Intervention Category Major Interventions: Acid-Base disturbance - evaluation and management;Respiratory failure - evaluation and management  Sommer,Steven Eugene 11/20/2019, 2:05 AM

## 2019-11-20 NOTE — Progress Notes (Signed)
ANTICOAGULATION CONSULT NOTE   Pharmacy Consult for Heparin Indication: atrial fibrillation and pulmonary embolus  Patient Measurements: Height: 5' 10.5" (179.1 cm) Weight: 179 lb 7.3 oz (81.4 kg) IBW/kg (Calculated) : 74.15  Vital Signs: Temp: 98.8 F (37.1 C) (12/06 0800) Temp Source: Rectal (12/06 0800) BP: 113/60 (12/06 0806) Pulse Rate: 87 (12/06 0806)  Labs: Recent Labs    11/19/19 0312  11/19/19 0402  11/19/19 1039 11/19/19 1453 11/19/19 2117 11/20/19 0022 11/20/19 0439 11/20/19 0447 11/20/19 0448 11/20/19 0725  HGB  --    < > 11.3*   < >  --   --   --  11.6* 11.9*  --  10.5*  --   HCT  --    < > 35.5*   < >  --   --   --  34.0* 35.0*  --  32.6*  --   PLT  --   --  125*  --   --   --   --   --   --   --  110*  --   HEPARINUNFRC 0.24*  --   --   --  0.21*  --  0.23*  --   --   --   --  0.30  CREATININE 4.45*  --   --   --   --  3.03*  --   --   --  2.35*  --   --    < > = values in this interval not displayed.    Estimated Creatinine Clearance: 39 mL/min (A) (by C-G formula based on SCr of 2.35 mg/dL (H)).  Assessment: 53 yo male with new onset afib and possible PE for heparin.   Heparin level at low end of goal this morning at 0.3, no bleeding issues noted per RN.   Goal of Therapy:  Heparin level 0.3-0.7 units/ml  Monitor platelets by anticoagulation protocol: Yes   Plan:  Increase heparin to 1500 units/hr Monitor daily heparin level, CBC  Erin Hearing PharmD., BCPS Clinical Pharmacist 11/20/2019 8:24 AM

## 2019-11-20 NOTE — Progress Notes (Signed)
[facility] ROUNDING NOTE   Subjective:   Is a 53 year old gentleman history of SVT systolic heart failure polysubstance abuse end-stage renal disease Monday Wednesday Friday dialysis [facility] kidney center status post PEA arrest 11/16/2019.  Occurred about 3-1/2 hours into hemodialysis treatment received 3 rounds of epinephrine 1 g of calcium 50 mEq of bicarbonate with ROSC of about 15 minutes.  Emergent cricothyroitomy performed by EMS due to failed intubation.  EEG revealed profound diffuse encephalopathy nonspecific to etiology with no seizure or epileptiform discharges.  Intermittent episodes of atrial fibrillation started on IV amiodarone but discontinued due to increased QT interval.  Now on oral amiodarone 400 mg twice daily.  Appreciate assistance from cardiology Dr. Meda Coffee.  CRRT initiated 11/18/2019 and running well attempting to remove 50 cc an hour.  Blood pressure 113/60   pulse 87 temperature 98.8 O2 sats 93 FiO2 50  IV norepinephrine IV heparin IV Protonix   Sodium 135 potassium 4.4 chloride 103 CO2 20 BUN 19 creatinine 2.35 glucose 146 calcium 8.4 phosphorus  2.2 albumin 3.0 WBC 8.1 hemoglobin 10.5 platelets 110   Amiodarone 400 mg twice daily, midodrine 5 mg every 8 hours, Protonix 40 mg daily  CT scan chest 11/16/2019 segmental low-attenuation filling defects in the right middle and left lower lobe suspicious for pulmonary emboli.  Pneumomediastinum subcutaneous emphysema bilateral anterior rib fractures.  Right large pleural effusion new loculated collection of fluid base left hemithorax.  Collapse consolidation of right middle and right lower lobe.  Mediastinal adenopathy.  Ascites.  Coronary artery calcification.  Pulmonary artery hypertension.   Objective:  Vital signs in last 24 hours:  Temp:  [97.9 F (36.6 C)-98.8 F (37.1 C)] 98.8 F (37.1 C) (12/06 0800) Pulse Rate:  [32-112] 87 (12/06 0806) Resp:  [13-28] 25 (12/06 0806) BP:  (70-115)/(18-86) 113/60 (12/06 0806) SpO2:  [43 %-93 %] 93 % (12/06 0800) Arterial Line BP: (74-124)/(45-73) 112/67 (12/06 0800) FiO2 (%):  [50 %] 50 % (12/06 0806) Weight:  [81.4 kg] 81.4 kg (12/06 0756)  Weight change:  Filed Weights   11/16/19 1300 11/20/19 0756  Weight: 80 kg 81.4 kg    Intake/Output: I/O last 3 completed shifts: In: 4693.6 [I.V.:2913.9; NG/GT:1279.5; IV Piggyback:500.1] Out: 0109 [Other:5295]   Intake/Output this shift:  Total I/O In: 129.3 [I.V.:74.3; NG/GT:55] Out: 187 [Other:187]  Cricothyrotomy, sedated, paralyzed LUE AVF +B/T Brady, nl s1s2 Coarse bs b/l S/mild distension No LEE   Basic Metabolic Panel: Recent Labs  Lab 11/18/19 0306 11/18/19 0547 11/18/19 1453 11/18/19 1619 11/19/19 0312  11/19/19 0828 11/19/19 1453 11/20/19 0022 11/20/19 0439 11/20/19 0447  NA 138 138  --  136 135   < > 136 135 137 136 135  K 4.1 3.8  --  4.9 4.9   < > 4.6 4.3 4.3 4.5 4.4  CL 101 104  --  102 101  --   --  102  --   --  103  CO2 22  --   --  22 22  --   --  23  --   --  20*  GLUCOSE 83 94  --  79 92  --   --  112*  --   --  146*  BUN 42* 37*  --  38* 25*  --   --  19  --   --  19  CREATININE 8.77* 9.00*  --  7.01* 4.45*  --   --  3.03*  --   --  2.35*  CALCIUM 8.1*  --   --  8.0* 7.9*  --   --  8.1*  --   --  8.4*  MG 1.8  --  1.9 2.0 2.2  --   --  2.3  --   --  2.5*  PHOS 5.9*  --  5.8* 5.6*  5.1* 3.9  4.0  --   --  2.2*  --   --  2.2*   < > = values in this interval not displayed.    Liver Function Tests: Recent Labs  Lab 11/16/19 1116 11/18/19 1619 11/19/19 0312 11/19/19 1453 11/20/19 0447  AST 30  --   --   --   --   ALT 17  --   --   --   --   ALKPHOS 98  --   --   --   --   BILITOT 1.2  --   --   --   --   PROT 7.7  --   --   --   --   ALBUMIN 3.5 3.1* 3.0* 2.8* 3.0*   No results for input(s): LIPASE, AMYLASE in the last 168 hours. No results for input(s): AMMONIA in the last 168 hours.  CBC: Recent Labs  Lab  11/16/19 1116 11/16/19 1238  11/17/19 0500  11/19/19 0402 11/19/19 0828 11/20/19 0022 11/20/19 0439 11/20/19 0448  WBC 8.8 10.0  --  11.1*  --  7.0  --   --   --  8.1  NEUTROABS 5.6  --   --   --   --  6.1  --   --   --  7.7  HGB 13.1 12.4*   < > 12.2*   < > 11.3* 11.9* 11.6* 11.9* 10.5*  HCT 42.5 34.0*   < > 37.0*   < > 35.5* 35.0* 34.0* 35.0* 32.6*  MCV 100.2* 98.6  --  93.0  --  96.7  --   --   --  95.3  PLT 170 178  --  125*  --  125*  --   --   --  110*   < > = values in this interval not displayed.    Cardiac Enzymes: No results for input(s): CKTOTAL, CKMB, CKMBINDEX, TROPONINI in the last 168 hours.  BNP: Invalid input(s): POCBNP  CBG: Recent Labs  Lab 11/19/19 1554 11/19/19 2024 11/19/19 2052 11/19/19 2350 11/20/19 0334  GLUCAP 117* 60* 179* 112* 105*    Microbiology: Results for orders placed or performed during the hospital encounter of 11/16/19  SARS Coronavirus 2 by RT PCR (hospital order, performed in [facility] hospital lab) Nasopharyngeal Nasopharyngeal Swab     Status: None   Collection Time: 11/16/19 11:41 AM   Specimen: Nasopharyngeal Swab  Result Value Ref Range Status   SARS Coronavirus 2 NEGATIVE NEGATIVE Final    Comment: (NOTE) SARS-CoV-2 target nucleic acids are NOT DETECTED. The SARS-CoV-2 RNA is generally detectable in upper and lower respiratory specimens during the acute phase of infection. The lowest concentration of SARS-CoV-2 viral copies this assay can detect is 250 copies / mL. A negative result does not preclude SARS-CoV-2 infection and should not be used as the sole basis for treatment or other patient management decisions.  A negative result may occur with improper specimen collection / handling, submission of specimen other than nasopharyngeal swab, presence of viral mutation(s) within the areas targeted by this assay, and inadequate number of viral copies (<250 copies / mL). A negative result must  be combined with  clinical observations, patient history, and epidemiological information. Fact Sheet for Patients:   StrictlyIdeas.no Fact Sheet for Healthcare Providers: BankingDealers.co.za This test is not yet approved or cleared  by the [place] FDA and has been authorized for detection and/or diagnosis of SARS-CoV-2 by FDA under an Emergency Use Authorization (EUA).  This EUA will remain in effect (meaning this test can be used) for the duration of the COVID-19 declaration under Section 564(b)(1) of the Act, 21 U.S.C. section 360bbb-3(b)(1), unless the authorization is terminated or revoked sooner. Performed at [facility], [place] [address], [place], [place] [address]   Culture, blood (routine x 2)     Status: None (Preliminary result)   Collection Time: 11/16/19  6:03 PM   Specimen: BLOOD RIGHT HAND  Result Value Ref Range Status   Specimen Description BLOOD RIGHT HAND  Final   Special Requests   Final    BOTTLES DRAWN AEROBIC AND ANAEROBIC Blood Culture results may not be optimal due to an inadequate volume of blood received in culture bottles   Culture   Final    NO GROWTH 3 DAYS Performed at [facility], [place] [address] [place], [place] [address]    Report Status PENDING  Incomplete  MRSA PCR Screening     Status: None   Collection Time: 11/16/19  6:05 PM   Specimen: Nasal Mucosa; Nasopharyngeal  Result Value Ref Range Status   MRSA by PCR NEGATIVE NEGATIVE Final    Comment:        The GeneXpert MRSA Assay (FDA approved for NASAL specimens only), is one component of a comprehensive MRSA colonization surveillance program. It is not intended to diagnose MRSA infection nor to guide or monitor treatment for MRSA infections. Performed at [facility], [place] [address] [place], [place] [address]   Culture, blood (routine x 2)     Status: None (Preliminary result)   Collection Time: 11/16/19  6:08 PM   Specimen:  BLOOD RIGHT HAND  Result Value Ref Range Status   Specimen Description BLOOD RIGHT HAND  Final   Special Requests   Final    BOTTLES DRAWN AEROBIC ONLY Blood Culture results may not be optimal due to an inadequate volume of blood received in culture bottles   Culture   Final    NO GROWTH 3 DAYS Performed at [facility], [place] [address], [place], [place] [address]    Report Status PENDING  Incomplete  Culture, respiratory (non-expectorated)     Status: None (Preliminary result)   Collection Time: 11/18/19 12:28 PM   Specimen: Tracheal Aspirate; Respiratory  Result Value Ref Range Status   Specimen Description TRACHEAL ASPIRATE  Final   Special Requests NONE  Final   Gram Stain   Final    ABUNDANT WBC PRESENT,BOTH PMN AND MONONUCLEAR ABUNDANT GRAM POSITIVE COCCI ABUNDANT GRAM NEGATIVE RODS FEW GRAM VARIABLE ROD    Culture   Final    ABUNDANT HAEMOPHILUS INFLUENZAE BETA LACTAMASE POSITIVE Performed at [facility], [place] [address], [place], [place] [address]    Report Status PENDING  Incomplete    Coagulation Studies: No results for input(s): LABPROT, INR in the last 72 hours.  Urinalysis: No results for input(s): COLORURINE, LABSPEC, PHURINE, GLUCOSEU, HGBUR, BILIRUBINUR, KETONESUR, PROTEINUR, UROBILINOGEN, NITRITE, LEUKOCYTESUR in the last 72 hours.  Invalid input(s): APPERANCEUR    Imaging: Am Dg Chest Port 1 View  Result Date: 11/19/2019 CLINICAL DATA:  Respiratory failure,  cardiac arrest. EXAM: PORTABLE CHEST 1 VIEW COMPARISON:  None FINDINGS: There is a tracheostomy tube with tip above carina. Feeding tube tip is well below the expected level of the GE junction. There is a right subclavian central venous catheter with tip the expected location of the SVC. Cardiac enlargement. Bilateral interstitial and airspace opacities are identified throughout both lungs. Bilateral pleural effusions suspected. When compared with the previous exam the overall aeration a lungs  is unchanged. IMPRESSION: 1. No change in aeration to the lungs compared with previous exam. 2. Stable support apparatus. 3. No change in aeration to the heart and lungs compared with prior exam. Electronically Signed   By: Kerby Moors M.D.   On: 11/19/2019 10:56     Medications:   .  prismasol BGK 4/2.5 500 mL/hr at 11/20/19 0522  .  prismasol BGK 4/2.5 500 mL/hr at 11/20/19 0522  . sodium chloride Stopped (11/17/19 0836)  . sodium chloride 10 mL/hr at 11/20/19 0800  . ampicillin-sulbactam (UNASYN) IV Stopped (11/20/19 0551)  . dextrose Stopped (11/19/19 1103)  . feeding supplement (VITAL AF 1.2 CAL) 1,000 mL (11/19/19 1106)  . fentaNYL infusion INTRAVENOUS 30 mcg/hr (11/20/19 0800)  . heparin 1,500 Units/hr (11/20/19 0830)  . norepinephrine (LEVOPHED) Adult infusion 24 mcg/min (11/20/19 0800)  . prismasol BGK 4/2.5 2,000 mL/hr at 11/20/19 0834   . amiodarone  400 mg Per Tube BID   Followed by  . [START ON 11/27/2019] amiodarone  200 mg Per Tube BID  . artificial tears  1 application Both Eyes W6F  . B-complex with vitamin C  1 tablet Per Tube Daily  . chlorhexidine gluconate (MEDLINE KIT)  15 mL Mouth Rinse BID  . Chlorhexidine Gluconate Cloth  6 each Topical Daily  . docusate  100 mg Per Tube Daily  . feeding supplement (PRO-STAT SUGAR FREE 64)  30 mL Per Tube TID  . fentaNYL (SUBLIMAZE) injection  100 mcg Intravenous Once  . mouth rinse  15 mL Mouth Rinse 10 times per day  . midazolam  2 mg Intravenous Once  . midodrine  5 mg Per Tube Q8H  . pantoprazole (PROTONIX) IV  40 mg Intravenous QHS  . Thrombi-Pad  1 each Topical Once  . white petrolatum   Topical Daily   sodium chloride, acetaminophen, fentaNYL, heparin, sodium chloride   HD Rx: [facility] MWF, 4h 77mn; 2/2.5 bath; 450/1.5; F180; UFP 4; IVB Hep 5 k qTx; C3 0.75 qTx; no ESA/Fe; Hb 12s usually  Assessment/ Plan:   [facility]  Patient initiated on CRRT  11/19/2019.  Attempting 50 cc an hour removal Pre and post filter replacement fluids 500 cc an hour.  Composition of fluids and dialysate 4 mEq potassium 2.5 mEq calcium  Status post PEA arrest.  Appreciate assistance from Dr. [id number] 2 at hemodialysis prolonged period of ROSC about 15 minutes..  11/17/2019 2D echo less than 20% ejection fraction.  New onset atrial fibrillation CHA2DS2-VASc score 3 IV heparin.  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  Midodrine added per critical care medicine.  ANEMIA-no major issue  MBD-severe chronic hyperphosphatemia  ACCESS-AV fistula  Diffuse nonspecific encephalopathy EEG performed appreciate assistance from Dr. YHortense Ramal Emergent cricothyroidotomy  acute respiratory failure.  Will need tracheostomy placement if plan is to continue.  History of polysubstance abuse  CT scan of chest with suspicion for pulmonary embolus.  Continue IV heparin   LOS: 4 Sherril Croon @TODAY @8 :45 AM

## 2019-11-21 DIAGNOSIS — I428 Other cardiomyopathies: Secondary | ICD-10-CM | POA: Diagnosis not present

## 2019-11-21 DIAGNOSIS — N186 End stage renal disease: Secondary | ICD-10-CM | POA: Diagnosis not present

## 2019-11-21 DIAGNOSIS — J690 Pneumonitis due to inhalation of food and vomit: Secondary | ICD-10-CM

## 2019-11-21 DIAGNOSIS — J9601 Acute respiratory failure with hypoxia: Secondary | ICD-10-CM | POA: Diagnosis not present

## 2019-11-21 DIAGNOSIS — I469 Cardiac arrest, cause unspecified: Secondary | ICD-10-CM | POA: Diagnosis not present

## 2019-11-21 LAB — RENAL FUNCTION PANEL
Albumin: 2.7 g/dL — ABNORMAL LOW (ref 3.5–5.0)
Albumin: 2.7 g/dL — ABNORMAL LOW (ref 3.5–5.0)
Anion gap: 12 (ref 5–15)
Anion gap: 9 (ref 5–15)
BUN: 14 mg/dL (ref 6–20)
BUN: 14 mg/dL (ref 6–20)
CO2: 22 mmol/L (ref 22–32)
CO2: 22 mmol/L (ref 22–32)
Calcium: 8.3 mg/dL — ABNORMAL LOW (ref 8.9–10.3)
Calcium: 8.2 mg/dL — ABNORMAL LOW (ref 8.9–10.3)
Chloride: 101 mmol/L (ref 98–111)
Chloride: 101 mmol/L (ref 98–111)
Creatinine, Ser: 1.66 mg/dL — ABNORMAL HIGH (ref 0.61–1.24)
Creatinine, Ser: 1.72 mg/dL — ABNORMAL HIGH (ref 0.61–1.24)
GFR calc Af Amer: 54 mL/min — ABNORMAL LOW (ref >60)
GFR calc Af Amer: 52 mL/min — ABNORMAL LOW (ref >60)
GFR calc non Af Amer: 47 mL/min — ABNORMAL LOW (ref >60)
GFR calc non Af Amer: 45 mL/min — ABNORMAL LOW (ref >60)
Glucose, Bld: 103 mg/dL — ABNORMAL HIGH (ref 70–99)
Glucose, Bld: 106 mg/dL — ABNORMAL HIGH (ref 70–99)
Phosphorus: 1.8 mg/dL — ABNORMAL LOW (ref 2.5–4.6)
Phosphorus: 1.6 mg/dL — ABNORMAL LOW (ref 2.5–4.6)
Potassium: 4.2 mmol/L (ref 3.5–5.1)
Potassium: 4.2 mmol/L (ref 3.5–5.1)
Sodium: 135 mmol/L (ref 135–145)
Sodium: 132 mmol/L — ABNORMAL LOW (ref 135–145)

## 2019-11-21 LAB — CULTURE, BLOOD (ROUTINE X 2)
Culture: NO GROWTH
Culture: NO GROWTH

## 2019-11-21 LAB — CBC WITH DIFFERENTIAL/PLATELET
Abs Immature Granulocytes: 0.06 10*3/uL (ref 0.00–0.07)
Basophils Absolute: 0.1 10*3/uL (ref 0.0–0.1)
Basophils Relative: 1 %
Eosinophils Absolute: 0.2 10*3/uL (ref 0.0–0.5)
Eosinophils Relative: 2 %
HCT: 33.2 % — ABNORMAL LOW (ref 39.0–52.0)
Hemoglobin: 10.7 g/dL — ABNORMAL LOW (ref 13.0–17.0)
Immature Granulocytes: 1 %
Lymphocytes Relative: 5 %
Lymphs Abs: 0.5 10*3/uL — ABNORMAL LOW (ref 0.7–4.0)
MCH: 30.4 pg (ref 26.0–34.0)
MCHC: 32.2 g/dL (ref 30.0–36.0)
MCV: 94.3 fL (ref 80.0–100.0)
Monocytes Absolute: 0.8 10*3/uL (ref 0.1–1.0)
Monocytes Relative: 8 %
Neutro Abs: 7.8 10*3/uL — ABNORMAL HIGH (ref 1.7–7.7)
Neutrophils Relative %: 83 %
Platelets: 113 10*3/uL — ABNORMAL LOW (ref 150–400)
RBC: 3.52 MIL/uL — ABNORMAL LOW (ref 4.22–5.81)
RDW: 16.3 % — ABNORMAL HIGH (ref 11.5–15.5)
WBC: 9.3 10*3/uL (ref 4.0–10.5)
nRBC: 0.6 % — ABNORMAL HIGH (ref 0.0–0.2)

## 2019-11-21 LAB — POCT I-STAT 7, (LYTES, BLD GAS, ICA,H+H)
Acid-base deficit: 2 mmol/L (ref 0.0–2.0)
Bicarbonate: 24.2 mmol/L (ref 20.0–28.0)
Calcium, Ion: 1.13 mmol/L — ABNORMAL LOW (ref 1.15–1.40)
HCT: 35 % — ABNORMAL LOW (ref 39.0–52.0)
Hemoglobin: 11.9 g/dL — ABNORMAL LOW (ref 13.0–17.0)
O2 Saturation: 99 %
Patient temperature: 35.6
Potassium: 4.2 mmol/L (ref 3.5–5.1)
Sodium: 136 mmol/L (ref 135–145)
TCO2: 26 mmol/L (ref 22–32)
pCO2 arterial: 43.4 mmHg (ref 32.0–48.0)
pH, Arterial: 7.348 — ABNORMAL LOW (ref 7.350–7.450)
pO2, Arterial: 144 mmHg — ABNORMAL HIGH (ref 83.0–108.0)

## 2019-11-21 LAB — HEPARIN LEVEL (UNFRACTIONATED): Heparin Unfractionated: 0.46 IU/mL (ref 0.30–0.70)

## 2019-11-21 LAB — GLUCOSE, CAPILLARY
Glucose-Capillary: 102 mg/dL — ABNORMAL HIGH (ref 70–99)
Glucose-Capillary: 104 mg/dL — ABNORMAL HIGH (ref 70–99)
Glucose-Capillary: 110 mg/dL — ABNORMAL HIGH (ref 70–99)
Glucose-Capillary: 39 mg/dL — CL (ref 70–99)
Glucose-Capillary: 93 mg/dL (ref 70–99)
Glucose-Capillary: 94 mg/dL (ref 70–99)

## 2019-11-21 LAB — MAGNESIUM: Magnesium: 2.4 mg/dL (ref 1.7–2.4)

## 2019-11-21 NOTE — Progress Notes (Signed)
[facility] ROUNDING NOTE   Subjective:   Is a 53 year old gentleman history of SVT systolic heart failure polysubstance abuse end-stage renal disease Monday Wednesday Friday dialysis [facility] kidney center status post PEA arrest 11/16/2019.  Occurred about 3-1/2 hours into hemodialysis treatment received 3 rounds of epinephrine 1 g of calcium 50 mEq of bicarbonate with ROSC of about 15 minutes.  Emergent cricothyroitomy performed by EMS due to failed intubation.  EEG revealed profound diffuse encephalopathy nonspecific to etiology with no seizure or epileptiform discharges.  Intermittent episodes of atrial fibrillation started on IV amiodarone but discontinued due to increased QT interval.  Now on oral amiodarone 400 mg twice daily.  CRRT initiated 11/18/2019.   Daily Update:  seen in ICU, on pressors x 1  pulled - 4.1 L net yesterday  Wt's dow 80 >> 75kg   Objective:  Vital signs in last 24 hours:  Temp:  [96.1 F (35.6 C)-98.2 F (36.8 C)] 97.9 F (36.6 C) (12/07 1200) Pulse Rate:  [94-113] 94 (12/07 1536) Resp:  [13-29] 15 (12/07 1600) BP: (78-104)/(46-88) 94/62 (12/07 1600) SpO2:  [96 %-100 %] 98 % (12/07 1536) Arterial Line BP: (84-134)/(40-68) 119/47 (12/07 1600) FiO2 (%):  [40 %-50 %] 40 % (12/07 1536) Weight:  [74.6 kg] 74.6 kg (12/07 0600)  Weight change:  Filed Weights   11/16/19 1300 11/20/19 0756 11/21/19 0600  Weight: 80 kg 81.4 kg 74.6 kg    Intake/Output: I/O last 3 completed shifts: In: 4555.9 [I.V.:2790.6; NG/GT:1250.4; IV Piggyback:514.9] Out: 8910 [Emesis/NG output:800; Other:8110]   Intake/Output this shift:  Total I/O In: 934.6 [I.V.:834.6; IV Piggyback:100] Out: 1173 [Other:1173]  Cricothyrotomy, sedated, paralyzed LUE AVF +B/T Brady, nl s1s2 Coarse bs b/l S/mild distension No LEE   Basic Metabolic Panel: Recent Labs  Lab 11/18/19 1619 11/19/19 0312  11/19/19 1453  11/20/19 0439 11/20/19 0447 11/20/19 1600  11/21/19 0320 11/21/19 0321  NA 136 135   < > 135   < > 136 135 135 136 135  K 4.9 4.9   < > 4.3   < > 4.5 4.4 4.4 4.2 4.2  CL 102 101  --  102  --   --  103 102  --  101  CO2 22 22  --  23  --   --  20* 21*  --  22  GLUCOSE 79 92  --  112*  --   --  146* 100*  --  103*  BUN 38* 25*  --  19  --   --  19 20  --  14  CREATININE 7.01* 4.45*  --  3.03*  --   --  2.35* 2.00*  --  1.66*  CALCIUM 8.0* 7.9*  --  8.1*  --   --  8.4* 8.2*  --  8.3*  MG 2.0 2.2  --  2.3  --   --  2.5*  --   --  2.4  PHOS 5.6*  5.1* 3.9  4.0  --  2.2*  --   --  2.2* 1.9*  --  1.8*   < > = values in this interval not displayed.    Liver Function Tests: Recent Labs  Lab 11/16/19 1116  11/19/19 0312 11/19/19 1453 11/20/19 0447 11/20/19 1600 11/21/19 0321  AST 30  --   --   --   --   --   --   ALT 17  --   --   --   --   --   --  ALKPHOS 98  --   --   --   --   --   --   BILITOT 1.2  --   --   --   --   --   --   PROT 7.7  --   --   --   --   --   --   ALBUMIN 3.5   < > 3.0* 2.8* 3.0* 2.8* 2.7*   < > = values in this interval not displayed.   No results for input(s): LIPASE, AMYLASE in the last 168 hours. No results for input(s): AMMONIA in the last 168 hours.  CBC: Recent Labs  Lab 11/16/19 1116 11/16/19 1238  11/17/19 0500  11/19/19 0402  11/20/19 0022 11/20/19 0439 11/20/19 0448 11/21/19 0320 11/21/19 0321  WBC 8.8 10.0  --  11.1*  --  7.0  --   --   --  8.1  --  9.3  NEUTROABS 5.6  --   --   --   --  6.1  --   --   --  7.7  --  7.8*  HGB 13.1 12.4*   < > 12.2*   < > 11.3*   < > 11.6* 11.9* 10.5* 11.9* 10.7*  HCT 42.5 34.0*   < > 37.0*   < > 35.5*   < > 34.0* 35.0* 32.6* 35.0* 33.2*  MCV 100.2* 98.6  --  93.0  --  96.7  --   --   --  95.3  --  94.3  PLT 170 178  --  125*  --  125*  --   --   --  110*  --  113*   < > = values in this interval not displayed.    Cardiac Enzymes: No results for input(s): CKTOTAL, CKMB, CKMBINDEX, TROPONINI in the last 168 hours.  BNP: Invalid input(s):  POCBNP  CBG: Recent Labs  Lab 11/20/19 2003 11/20/19 2353 11/21/19 0825 11/21/19 1151 11/21/19 1552  GLUCAP 92 102* 94 93 104*    Microbiology: Results for orders placed or performed during the hospital encounter of 11/16/19  SARS Coronavirus 2 by RT PCR (hospital order, performed in [facility] hospital lab) Nasopharyngeal Nasopharyngeal Swab     Status: None   Collection Time: 11/16/19 11:41 AM   Specimen: Nasopharyngeal Swab  Result Value Ref Range Status   SARS Coronavirus 2 NEGATIVE NEGATIVE Final    Comment: (NOTE) SARS-CoV-2 target nucleic acids are NOT DETECTED. The SARS-CoV-2 RNA is generally detectable in upper and lower respiratory specimens during the acute phase of infection. The lowest concentration of SARS-CoV-2 viral copies this assay can detect is 250 copies / mL. A negative result does not preclude SARS-CoV-2 infection and should not be used as the sole basis for treatment or other patient management decisions.  A negative result may occur with improper specimen collection / handling, submission of specimen other than nasopharyngeal swab, presence of viral mutation(s) within the areas targeted by this assay, and inadequate number of viral copies (<250 copies / mL). A negative result must be combined with clinical observations, patient history, and epidemiological information. Fact Sheet for Patients:   StrictlyIdeas.no Fact Sheet for Healthcare Providers: BankingDealers.co.za This test is not yet approved or cleared  by the [place] FDA and has been authorized for detection and/or diagnosis of SARS-CoV-2 by FDA under an Emergency Use Authorization (EUA).  This EUA will remain in effect (meaning this test can be used) for the duration of the  COVID-19 declaration under Section 564(b)(1) of the Act, 21 U.S.C. section 360bbb-3(b)(1), unless the authorization is terminated or revoked sooner. Performed at  [facility], [place] [address], [place], [place] [address]   Culture, blood (routine x 2)     Status: None (Preliminary result)   Collection Time: 11/16/19  6:03 PM   Specimen: BLOOD RIGHT HAND  Result Value Ref Range Status   Specimen Description BLOOD RIGHT HAND  Final   Special Requests   Final    BOTTLES DRAWN AEROBIC AND ANAEROBIC Blood Culture results may not be optimal due to an inadequate volume of blood received in culture bottles   Culture   Final    NO GROWTH 4 DAYS Performed at [facility], [place] [address], [place], [place] [address]    Report Status PENDING  Incomplete  MRSA PCR Screening     Status: None   Collection Time: 11/16/19  6:05 PM   Specimen: Nasal Mucosa; Nasopharyngeal  Result Value Ref Range Status   MRSA by PCR NEGATIVE NEGATIVE Final    Comment:        The GeneXpert MRSA Assay (FDA approved for NASAL specimens only), is one component of a comprehensive MRSA colonization surveillance program. It is not intended to diagnose MRSA infection nor to guide or monitor treatment for MRSA infections. Performed at [facility], [place] [address], [place], [place] [address]   Culture, blood (routine x 2)     Status: None (Preliminary result)   Collection Time: 11/16/19  6:08 PM   Specimen: BLOOD RIGHT HAND  Result Value Ref Range Status   Specimen Description BLOOD RIGHT HAND  Final   Special Requests   Final    BOTTLES DRAWN AEROBIC ONLY Blood Culture results may not be optimal due to an inadequate volume of blood received in culture bottles   Culture   Final    NO GROWTH 4 DAYS Performed at [facility], [place] [address], [place], [place] [address]    Report Status PENDING  Incomplete  Culture, respiratory (non-expectorated)     Status: None   Collection Time: 11/18/19 12:28 PM   Specimen: Tracheal Aspirate; Respiratory  Result Value Ref Range Status   Specimen Description TRACHEAL ASPIRATE  Final   Special Requests NONE  Final    Gram Stain   Final    ABUNDANT WBC PRESENT,BOTH PMN AND MONONUCLEAR ABUNDANT GRAM POSITIVE COCCI ABUNDANT GRAM NEGATIVE RODS FEW GRAM VARIABLE ROD    Culture   Final    ABUNDANT HAEMOPHILUS INFLUENZAE BETA LACTAMASE POSITIVE Performed at [facility], [place] [address] [place], [place] [address]    Report Status 11/20/2019 FINAL  Final    Coagulation Studies: No results for input(s): LABPROT, INR in the last 72 hours.  Urinalysis: No results for input(s): COLORURINE, LABSPEC, PHURINE, GLUCOSEU, HGBUR, BILIRUBINUR, KETONESUR, PROTEINUR, UROBILINOGEN, NITRITE, LEUKOCYTESUR in the last 72 hours.  Invalid input(s): APPERANCEUR    Imaging: Dg Abd 1 View  Result Date: 11/20/2019 CLINICAL DATA:  Nasogastric tube placement EXAM: ABDOMEN - 1 VIEW COMPARISON:  Portable exam 1303 hours compared to 12/07 hours FINDINGS: Tip of feeding tube projects over mid stomach. Tip of nasogastric tube proxy acts over proximal stomach. Decreased gaseous distention of stomach since previous exam. Enlargement of cardiac silhouette with vascular congestion and probable pulmonary edema. Atelectasis versus consolidation at LEFT lower lobe. Minimal pleural effusions. IMPRESSION: Nasogastric tube and feeding tube projects over stomach as above. Electronically Signed  By: Lavonia Dana M.D.   On: 11/20/2019 13:49   Dg Abd Portable 1v  Result Date: 11/20/2019 CLINICAL DATA:  Abdominal distension. EXAM: PORTABLE ABDOMEN - 1 VIEW COMPARISON:  None FINDINGS: There is a weighted feeding tube with tip projecting over the proximal body of stomach. There is diffuse gaseous distension of the stomach. Air-filled transverse colon and cecum noted. IMPRESSION: 1. Diffuse gaseous distension of the stomach is nonspecific. A feeding tube is noted with tip in the expected location of the body of stomach. Electronically Signed   By: Kerby Moors M.D.   On: 11/20/2019 13:10     Medications:   .  prismasol BGK 4/2.5 500 mL/hr  at 11/21/19 1600  .  prismasol BGK 4/2.5 500 mL/hr at 11/21/19 1603  . sodium chloride Stopped (11/17/19 0836)  . sodium chloride Stopped (11/20/19 1344)  . ampicillin-sulbactam (UNASYN) IV 3 g (11/21/19 1322)  . dextrose 50 mL/hr at 11/21/19 1600  . feeding supplement (VITAL AF 1.2 CAL) Stopped (11/20/19 1255)  . fentaNYL infusion INTRAVENOUS 100 mcg/hr (11/21/19 1600)  . heparin 1,500 Units/hr (11/21/19 1600)  . norepinephrine (LEVOPHED) Adult infusion 18 mcg/min (11/21/19 1600)  . prismasol BGK 4/2.5 2,000 mL/hr at 11/21/19 1600   . amiodarone  400 mg Per Tube BID   Followed by  . [START ON 11/27/2019] amiodarone  200 mg Per Tube BID  . artificial tears  1 application Both Eyes Q3R  . B-complex with vitamin C  1 tablet Per Tube Daily  . chlorhexidine gluconate (MEDLINE KIT)  15 mL Mouth Rinse BID  . Chlorhexidine Gluconate Cloth  6 each Topical Daily  . docusate  100 mg Per Tube Daily  . feeding supplement (PRO-STAT SUGAR FREE 64)  30 mL Per Tube TID  . fentaNYL (SUBLIMAZE) injection  100 mcg Intravenous Once  . mouth rinse  15 mL Mouth Rinse 10 times per day  . midazolam  2 mg Intravenous Once  . midodrine  5 mg Per Tube Q8H  . pantoprazole (PROTONIX) IV  40 mg Intravenous QHS  . Thrombi-Pad  1 each Topical Once  . white petrolatum   Topical Daily   sodium chloride, acetaminophen, fentaNYL, heparin, sodium chloride   HD [facility]: [facility] M[facility]  4h 74mn  74.5kg  2/2.5 bath  340/A1.5   P4  Hep 5000     C3 0.75 qTx     no ESA/Fe     Hb 12s usually  CXR 12/4 - bilat infiltrates / edema    Assessment/ Plan:   ESRD- MWF HD.  Started on CRRT 11/19/2019.  Large UF yest, 4 L neg. Plan change to keep even for soft BP's and is below edw now and requiring pressor support. Repeat CXR in am.   Status post PEA arrest-  Appreciate assistance from Dr. JMartinique12/2 at hemodialysis prolonged period of ROSC about 15 minutes..  11/17/2019 2D echo less than 20% ejection fraction.  New onset atrial  fibrillation CHA2DS2-VASc score 3 IV heparin.  Started oral amiodarone  Chronic combined congestive heart failure EF 20% not a candidate for ICD due to noncompliance and drug abuse  Prolonged QT interval unable to tolerate IV amiodarone  Cardiogenic shock hypotensive pressor support  ANEMIA-no major issue  MBD-severe chronic hyperphosphatemia  ACCESS-AV fistula  Diffuse nonspecific encephalopathy EEG performed appreciate assistance from Dr. YHortense Ramal Emergent cricothyroidotomy / acute respiratory failure- per CCM  History of polysubstance abuse  CT scan of chest with suspicion for pulmonary embolus.  Continue  IV heparin  Kelly Splinter, MD 11/21/2019, 4:22 PM

## 2019-11-21 NOTE — Progress Notes (Signed)
ANTICOAGULATION CONSULT NOTE   Pharmacy Consult for Heparin Indication: atrial fibrillation and pulmonary embolus  Patient Measurements: Height: 5' 10.5" (179.1 cm) Weight: 164 lb 7.4 oz (74.6 kg) IBW/kg (Calculated) : 74.15  Vital Signs: Temp: 97.9 F (36.6 C) (12/07 1200) Temp Source: Rectal (12/07 1200) BP: 84/67 (12/07 1300) Pulse Rate: 113 (12/07 1129)  Labs: Recent Labs    11/19/19 0402  11/19/19 2117  11/20/19 0447 11/20/19 0448 11/20/19 0725 11/20/19 1600 11/21/19 0320 11/21/19 0321  HGB 11.3*   < >  --    < >  --  10.5*  --   --  11.9* 10.7*  HCT 35.5*   < >  --    < >  --  32.6*  --   --  35.0* 33.2*  PLT 125*  --   --   --   --  110*  --   --   --  113*  HEPARINUNFRC  --    < > 0.23*  --   --   --  0.30  --   --  0.46  CREATININE  --    < >  --   --  2.35*  --   --  2.00*  --  1.66*   < > = values in this interval not displayed.    Estimated Creatinine Clearance: 55.3 mL/min (A) (by C-G formula based on SCr of 1.66 mg/dL (H)).  Assessment: 53 yo male with new onset afib and possible PE for heparin.   Heparin level at goal this morning at 0.46, no bleeding issues noted per RN.   Goal of Therapy:  Heparin level 0.3-0.7 units/ml  Monitor platelets by anticoagulation protocol: Yes   Plan:  Continue heparin at 1500 units/hr Monitor daily heparin level, CBC  Erin Hearing PharmD., BCPS Clinical Pharmacist 11/21/2019 1:54 PM

## 2019-11-21 NOTE — Progress Notes (Signed)
NAME:  [name] [name], MRN:  [id number], DOB:  [birth year], LOS: 5 ADMISSION DATE:  11/16/2019, CONSULTATION DATE:  12/2 REFERRING MD:  Dr. Sedonia Small, CHIEF COMPLAINT:  Post cardiac arrest    Brief History   53yo male presented post arrest which occurred during iHD treatment. This was a witnessed arrest with quick bystander CPR. Received 3 rounds of epi, 1g of calcium and 50 mEq of bicarb with ROSC after 15 minutes.   History of present illness   HPI obtained from medical chart review as patient is currently intubated on mechanical ventilation.  53 year old male with extensive PMH significant for but not limited to NSVT, combined systolic and diastolic CHF, polysubstance abuse, and  end-stage renal disease who presented to the ED after cardiac arrest.  Patient had presented to outpatient hemodialysis appointment 3-1/2 hours into HD treatment when patient suffered a witnessed cardiac arrest. On arrival to EMS patients rhythm was PEA.  Patient received 3 rounds of epi, 1g of calcium and 50 mEq of bicarb with ROSC after 15 minutes. Emergent cricothyrotomy performed by EMS due to failed intubation with [facility] airway.   Vital signs stable on admission, has remained in sinus rhythm since ROSC.  ABG on admission pH 7.227 / pCO2 65.7 / pO2 349.0 / bicarb 27.3. Troponin 171, creatinine 7.01 and  lactic acid 5.9.   PCCM consulted for admission   Past Medical History  NSVT 2017 Nonischemic cardiomyopathy Medical noncompliance Polysubstance abuse Hypertension End-stage renal disease Combined systolic and diastolic congestive heart failure Cocaine abuse Alcohol abuse Prolonged QTC  Significant Hospital Events   12/2 admitted post cardiac arrest  Consults:  Cardiology Nephrology  Procedures:  12/2 remeasuring cricothyrotomy per EMS  Significant Diagnostic Tests:  CXR 12/2 > Worsened right greater than left perihilar opacities most consistent with edema (versus atypical inflammation). Tip of the  tracheostomy tube in the proximal trachea, just below the thoracic inlet.  Micro Data:  COVID 12/2 > negative  Antimicrobials:  On Unasyn for total 5-day course for aspiration pneumonia.  To finish 12/8  Interim history/subjective:   This morning's movements are more purposeful.  Taking off net -50 cc/h on CRRT.  Objective   Blood pressure (!) 86/59, pulse 98, temperature 98.2 F (36.8 C), temperature source Rectal, resp. rate (!) 22, height 5' 10.5" (1.791 m), weight 74.6 kg, SpO2 96 %. CVP:  [18 mmHg-29 mmHg] 18 mmHg  Vent Mode: PRVC FiO2 (%):  [40 %-50 %] 40 % Set Rate:  [25 bmp] 25 bmp Vt Set:  [440 mL] 440 mL PEEP:  [5 cmH20] 5 cmH20 Plateau Pressure:  [23 cmH20-35 cmH20] 35 cmH20   Intake/Output Summary (Last 24 hours) at 11/21/2019 1119 Last data filed at 11/21/2019 1100 Gross per 24 hour  Intake 2622.33 ml  Output 6411 ml  Net -3788.67 ml   Filed Weights   11/16/19 1300 11/20/19 0756 11/21/19 0600  Weight: 80 kg 81.4 kg 74.6 kg    Examination: General: Obese male vent to cric.  HEENT: Emergency cric is tied in place Neuro: grimacing. Spontaneously moves eyes and lower extremities, intermittently follows commands, very weak not moving his left arm. CV: Heart sounds are regular, no mrg PULM: Coarse rhonchi GI: soft, bsx4 active  GU: None Extremities: warm/dry,  edema left bicep graft positive bruit Skin: no rashes or lesions Lines: Right femoral HD catheter, left femoral arterial line, right subclavian central venous catheter  Resolved Hospital Problem list     Assessment & Plan:  Cardiac arrest -Unknown etiology,  pt has hx of combined systolic and diastolic CHF, NSVT, prolonged QT, and polysubstance abuse  Cardiogenic shock -~15 minutes downtime, witnessed arrest with quick initiation of bystander CPR P: S/p TTM goal 33 degrees Central line and arterial line been placed in the groin Vasopressor support, weaning norepinephrine as able.  Appreciate  cardiology's input Continue midodrine, he is on a little bit less norepinephrine today.  Acute hypoxic respiratory failure Emergent cricothyrotomy   -In the setting of post cardiac arrest  P: Full ventilatory support Vent bundle Lung protective ventilation, decrease tidal volume to 6 cc/kg Will discuss tracheostomy with ENT as neurologic function improves.  Today meets criteria for SBT.  Will perform.  New onset A-fib HX NSVT HX Prolonged QT -EKG on admission with A-fib and prolonged QT P: Continuous telemetry  Rate control as able, however will attempt to just wean norepinephrine right now. TSH is 7, not suppressed. Cardiology consulted   On heparin drip Will transition amiodarone to po as he has received a 4g load.   Chronic combined systolic and diastolic congestive heart failure Nonischemic cardiomyopathy  -ECHO 11/10/18 with EF of 25-30% and grade 2 diastolicdysfunction  -Right and left heart cath 02/25/2016 with no angiographic evidence of CAD P: Cardiology consult Holding antihypertensives 2D echo  Acute encephalopathy Probably some component of delirium and anoxic encephalopathy from cardiac arrest still delirious and encephalopathic  Awaiting full neurologic recovery.  He is very weak, and is now intermittently following commands. Off sedation Delirium precautions Will need an MRI early this week if not improving. EEG shows profound diffuse encephalopathy Suspect ESRD status makes clearing sedation agents challenging  ESRD -Undergoing iHD at time of arrest, assuming regular schedule is MWF P: Nephrology is following Continue CRRT, will aim for net negative fluid balance as able  Polysubstance abuse -Hx of cocaine, alcohol, and tobacco abuse P: PAD protocol   Best practice:  Diet: tube feeds at goal Pain/Anxiety/Delirium protocol (if indicated): fentanyl prn VAP protocol (if indicated): yes, ordered DVT prophylaxis: heparin gtt GI prophylaxis: PPI  Glucose control: yes less than 180 Foley: none Mobility: bed rest Code Status: Full Family Communication: have not been able to reach mother or father over the phone and unable to leave a message. Will ask nurse to page me if they come in person.  Disposition: needs ICU  Labs   CBC: Recent Labs  Lab 11/16/19 1116 11/16/19 1238  11/17/19 0500  11/19/19 0402  11/20/19 0022 11/20/19 0439 11/20/19 0448 11/21/19 0320 11/21/19 0321  WBC 8.8 10.0  --  11.1*  --  7.0  --   --   --  8.1  --  9.3  NEUTROABS 5.6  --   --   --   --  6.1  --   --   --  7.7  --  7.8*  HGB 13.1 12.4*   < > 12.2*   < > 11.3*   < > 11.6* 11.9* 10.5* 11.9* 10.7*  HCT 42.5 34.0*   < > 37.0*   < > 35.5*   < > 34.0* 35.0* 32.6* 35.0* 33.2*  MCV 100.2* 98.6  --  93.0  --  96.7  --   --   --  95.3  --  94.3  PLT 170 178  --  125*  --  125*  --   --   --  110*  --  113*   < > = values in this interval not displayed.    Basic Metabolic Panel:  Recent Labs  Lab 11/18/19 1619 11/19/19 0312  11/19/19 1453  11/20/19 0439 11/20/19 0447 11/20/19 1600 11/21/19 0320 11/21/19 0321  NA 136 135   < > 135   < > 136 135 135 136 135  K 4.9 4.9   < > 4.3   < > 4.5 4.4 4.4 4.2 4.2  CL 102 101  --  102  --   --  103 102  --  101  CO2 22 22  --  23  --   --  20* 21*  --  22  GLUCOSE 79 92  --  112*  --   --  146* 100*  --  103*  BUN 38* 25*  --  19  --   --  19 20  --  14  CREATININE 7.01* 4.45*  --  3.03*  --   --  2.35* 2.00*  --  1.66*  CALCIUM 8.0* 7.9*  --  8.1*  --   --  8.4* 8.2*  --  8.3*  MG 2.0 2.2  --  2.3  --   --  2.5*  --   --  2.4  PHOS 5.6*  5.1* 3.9  4.0  --  2.2*  --   --  2.2* 1.9*  --  1.8*   < > = values in this interval not displayed.   GFR: Estimated Creatinine Clearance: 55.3 mL/min (A) (by C-G formula based on SCr of 1.66 mg/dL (H)). Recent Labs  Lab 11/16/19 1101  11/16/19 1346 11/16/19 1357 11/16/19 1740 11/16/19 2005 11/17/19 0500 11/18/19 0306 11/19/19 0402 11/20/19 0448 11/21/19  0321  PROCALCITON  --   --   --  0.69  --   --  24.16 31.62  --   --   --   WBC  --    < >  --   --   --   --  11.1*  --  7.0 8.1 9.3  LATICACIDVEN 5.9*  --  4.4*  --  1.2 1.3  --   --   --   --   --    < > = values in this interval not displayed.    Liver Function Tests: Recent Labs  Lab 11/16/19 1116  11/19/19 0312 11/19/19 1453 11/20/19 0447 11/20/19 1600 11/21/19 0321  AST 30  --   --   --   --   --   --   ALT 17  --   --   --   --   --   --   ALKPHOS 98  --   --   --   --   --   --   BILITOT 1.2  --   --   --   --   --   --   PROT 7.7  --   --   --   --   --   --   ALBUMIN 3.5   < > 3.0* 2.8* 3.0* 2.8* 2.7*   < > = values in this interval not displayed.   No results for input(s): LIPASE, AMYLASE in the last 168 hours. No results for input(s): AMMONIA in the last 168 hours.  ABG    Component Value Date/Time   PHART 7.348 (L) 11/21/2019 0320   PCO2ART 43.4 11/21/2019 0320   PO2ART 144.0 (H) 11/21/2019 0320   HCO3 24.2 11/21/2019 0320   TCO2 26 11/21/2019 0320   ACIDBASEDEF 2.0 11/21/2019 0320   O2SAT 99.0 11/21/2019  0320     Coagulation Profile: Recent Labs  Lab 11/16/19 1238 11/16/19 2005  INR 1.5* 1.6*    Cardiac Enzymes: No results for input(s): CKTOTAL, CKMB, CKMBINDEX, TROPONINI in the last 168 hours.  HbA1C: Hgb A1c MFr Bld  Date/Time Value Ref Range Status  03/10/2011 12:02 PM (H) <5.7 % Final   5.7 (NOTE)                                                                       According to the ADA Clinical Practice Recommendations for 2011, when HbA1c is used as a screening test:   >=6.5%   Diagnostic of Diabetes Mellitus           (if abnormal result  is confirmed)  5.7-6.4%   Increased risk of developing Diabetes Mellitus  References:Diagnosis and Classification of Diabetes Mellitus,Diabetes MWUX,3244,01(UUVOZ 1):S62-S69 and Standards of Medical Care in         Diabetes - 2011,Diabetes DGUY,4034,74  (Suppl 1):S11-S61.    CBG: Recent Labs  Lab  11/20/19 1139 11/20/19 1555 11/20/19 2003 11/20/19 2353 11/21/19 0825  GLUCAP 141* 95 92 102* 94    The patient is critically ill with multiple organ systems failure and requires high complexity decision making for assessment and support, frequent evaluation and titration of therapies, application of advanced monitoring technologies and extensive interpretation of multiple databases.   Critical Care Time devoted to patient care services described in this note is 39 minutes. This time reflects time of care of this [place] . This critical care time does not reflect separately billable procedures or procedure time, teaching time or supervisory time of PA/NP/Med student/Med Resident etc but could involve care discussion time.  [facility] 11/21/2019 11:19 AM  Pager: [phone] After hours pager: [phone]

## 2019-11-21 NOTE — Plan of Care (Signed)
  Problem: Neurologic: Goal: Promote progressive neurologic recovery Outcome: Progressing

## 2019-11-22 ENCOUNTER — Inpatient Hospital Stay (HOSPITAL_COMMUNITY): Payer: Medicare Other

## 2019-11-22 DIAGNOSIS — I428 Other cardiomyopathies: Secondary | ICD-10-CM | POA: Diagnosis not present

## 2019-11-22 DIAGNOSIS — I469 Cardiac arrest, cause unspecified: Secondary | ICD-10-CM | POA: Diagnosis not present

## 2019-11-22 DIAGNOSIS — I4901 Ventricular fibrillation: Secondary | ICD-10-CM

## 2019-11-22 DIAGNOSIS — I472 Ventricular tachycardia: Secondary | ICD-10-CM

## 2019-11-22 DIAGNOSIS — I5023 Acute on chronic systolic (congestive) heart failure: Secondary | ICD-10-CM | POA: Diagnosis not present

## 2019-11-22 DIAGNOSIS — A492 Hemophilus influenzae infection, unspecified site: Secondary | ICD-10-CM

## 2019-11-22 DIAGNOSIS — N186 End stage renal disease: Secondary | ICD-10-CM | POA: Diagnosis not present

## 2019-11-22 LAB — POCT I-STAT 7, (LYTES, BLD GAS, ICA,H+H)
Acid-base deficit: 3 mmol/L — ABNORMAL HIGH (ref 0.0–2.0)
Acid-base deficit: 1 mmol/L (ref 0.0–2.0)
Bicarbonate: 24.6 mmol/L (ref 20.0–28.0)
Bicarbonate: 23.3 mmol/L (ref 20.0–28.0)
Calcium, Ion: 1.17 mmol/L (ref 1.15–1.40)
Calcium, Ion: 1.1 mmol/L — ABNORMAL LOW (ref 1.15–1.40)
HCT: 36 % — ABNORMAL LOW (ref 39.0–52.0)
HCT: 35 % — ABNORMAL LOW (ref 39.0–52.0)
Hemoglobin: 12.2 g/dL — ABNORMAL LOW (ref 13.0–17.0)
Hemoglobin: 11.9 g/dL — ABNORMAL LOW (ref 13.0–17.0)
O2 Saturation: 85 %
O2 Saturation: 97 %
Patient temperature: 98
Potassium: 5 mmol/L (ref 3.5–5.1)
Potassium: 4 mmol/L (ref 3.5–5.1)
Sodium: 135 mmol/L (ref 135–145)
Sodium: 135 mmol/L (ref 135–145)
TCO2: 26 mmol/L (ref 22–32)
TCO2: 24 mmol/L (ref 22–32)
pCO2 arterial: 51.8 mmHg — ABNORMAL HIGH (ref 32.0–48.0)
pCO2 arterial: 38.3 mmHg (ref 32.0–48.0)
pH, Arterial: 7.282 — ABNORMAL LOW (ref 7.350–7.450)
pH, Arterial: 7.391 (ref 7.350–7.450)
pO2, Arterial: 56 mmHg — ABNORMAL LOW (ref 83.0–108.0)
pO2, Arterial: 87 mmHg (ref 83.0–108.0)

## 2019-11-22 LAB — CBC WITH DIFFERENTIAL/PLATELET
Abs Immature Granulocytes: 0.17 10*3/uL — ABNORMAL HIGH (ref 0.00–0.07)
Basophils Absolute: 0 10*3/uL (ref 0.0–0.1)
Basophils Relative: 0 %
Eosinophils Absolute: 0.3 10*3/uL (ref 0.0–0.5)
Eosinophils Relative: 2 %
HCT: 31.2 % — ABNORMAL LOW (ref 39.0–52.0)
Hemoglobin: 10.4 g/dL — ABNORMAL LOW (ref 13.0–17.0)
Immature Granulocytes: 2 %
Lymphocytes Relative: 7 %
Lymphs Abs: 0.7 10*3/uL (ref 0.7–4.0)
MCH: 31 pg (ref 26.0–34.0)
MCHC: 33.3 g/dL (ref 30.0–36.0)
MCV: 93.1 fL (ref 80.0–100.0)
Monocytes Absolute: 1.3 10*3/uL — ABNORMAL HIGH (ref 0.1–1.0)
Monocytes Relative: 12 %
Neutro Abs: 8.8 10*3/uL — ABNORMAL HIGH (ref 1.7–7.7)
Neutrophils Relative %: 77 %
Platelets: 115 10*3/uL — ABNORMAL LOW (ref 150–400)
RBC: 3.35 MIL/uL — ABNORMAL LOW (ref 4.22–5.81)
RDW: 16.3 % — ABNORMAL HIGH (ref 11.5–15.5)
WBC: 11.3 10*3/uL — ABNORMAL HIGH (ref 4.0–10.5)
nRBC: 0.8 % — ABNORMAL HIGH (ref 0.0–0.2)

## 2019-11-22 LAB — RENAL FUNCTION PANEL
Albumin: 2.5 g/dL — ABNORMAL LOW (ref 3.5–5.0)
Albumin: 2.3 g/dL — ABNORMAL LOW (ref 3.5–5.0)
Anion gap: 12 (ref 5–15)
Anion gap: 11 (ref 5–15)
BUN: 15 mg/dL (ref 6–20)
BUN: 24 mg/dL — ABNORMAL HIGH (ref 6–20)
CO2: 22 mmol/L (ref 22–32)
CO2: 20 mmol/L — ABNORMAL LOW (ref 22–32)
Calcium: 8.3 mg/dL — ABNORMAL LOW (ref 8.9–10.3)
Calcium: 7.8 mg/dL — ABNORMAL LOW (ref 8.9–10.3)
Chloride: 101 mmol/L (ref 98–111)
Chloride: 100 mmol/L (ref 98–111)
Creatinine, Ser: 1.7 mg/dL — ABNORMAL HIGH (ref 0.61–1.24)
Creatinine, Ser: 2.4 mg/dL — ABNORMAL HIGH (ref 0.61–1.24)
GFR calc Af Amer: 53 mL/min — ABNORMAL LOW (ref >60)
GFR calc Af Amer: 35 mL/min — ABNORMAL LOW (ref >60)
GFR calc non Af Amer: 46 mL/min — ABNORMAL LOW (ref >60)
GFR calc non Af Amer: 30 mL/min — ABNORMAL LOW (ref >60)
Glucose, Bld: 109 mg/dL — ABNORMAL HIGH (ref 70–99)
Glucose, Bld: 130 mg/dL — ABNORMAL HIGH (ref 70–99)
Phosphorus: 1.2 mg/dL — ABNORMAL LOW (ref 2.5–4.6)
Phosphorus: 2.4 mg/dL — ABNORMAL LOW (ref 2.5–4.6)
Potassium: 4.3 mmol/L (ref 3.5–5.1)
Potassium: 4.6 mmol/L (ref 3.5–5.1)
Sodium: 135 mmol/L (ref 135–145)
Sodium: 131 mmol/L — ABNORMAL LOW (ref 135–145)

## 2019-11-22 LAB — GLUCOSE, CAPILLARY
Glucose-Capillary: 105 mg/dL — ABNORMAL HIGH (ref 70–99)
Glucose-Capillary: 108 mg/dL — ABNORMAL HIGH (ref 70–99)
Glucose-Capillary: 113 mg/dL — ABNORMAL HIGH (ref 70–99)
Glucose-Capillary: 115 mg/dL — ABNORMAL HIGH (ref 70–99)
Glucose-Capillary: 129 mg/dL — ABNORMAL HIGH (ref 70–99)

## 2019-11-22 LAB — MAGNESIUM: Magnesium: 2.6 mg/dL — ABNORMAL HIGH (ref 1.7–2.4)

## 2019-11-22 LAB — HEPARIN LEVEL (UNFRACTIONATED): Heparin Unfractionated: 0.36 IU/mL (ref 0.30–0.70)

## 2019-11-22 MED ORDER — SODIUM CHLORIDE 0.9 % FOR CRRT
INTRAVENOUS_CENTRAL | Status: DC | PRN
  Filled 2019-11-22: qty 1000

## 2019-11-22 MED ORDER — POTASSIUM PHOSPHATES 15 MMOLE/5ML IV SOLN
30.0000 mmol | Freq: Once | INTRAVENOUS | Status: AC
  Administered 2019-11-22: 30 mmol via INTRAVENOUS
  Filled 2019-11-22: qty 10

## 2019-11-22 MED ORDER — PRISMASOL BGK 4/2.5 32-4-2.5 MEQ/L IV SOLN
INTRAVENOUS | Status: DC
  Administered 2019-11-22 (×2): via INTRAVENOUS_CENTRAL
  Administered 2019-11-22: 5000 mL via INTRAVENOUS_CENTRAL
  Administered 2019-11-23 – 2019-11-25 (×16): via INTRAVENOUS_CENTRAL

## 2019-11-22 MED ORDER — SODIUM CHLORIDE 0.9% FLUSH: 10.0000 mL | INTRAVENOUS | Status: DC | PRN

## 2019-11-22 MED ORDER — SODIUM CHLORIDE 0.9% FLUSH
10.0000 mL | Freq: Two times a day (BID) | INTRAVENOUS | Status: DC
  Administered 2019-11-22 – 2019-11-25 (×3): 10 mL
  Administered 2019-11-25: 20 mL
  Administered 2019-11-26 – 2019-12-23 (×33): 10 mL

## 2019-11-22 MED ORDER — AMIODARONE HCL IN DEXTROSE 360-4.14 MG/200ML-% IV SOLN
30.0000 mg/h | INTRAVENOUS | Status: DC
  Administered 2019-11-22 – 2019-11-29 (×14): 30 mg/h via INTRAVENOUS
  Filled 2019-11-22 (×13): qty 200

## 2019-11-22 MED ORDER — ACETAMINOPHEN 160 MG/5ML PO SOLN
650.0000 mg | Freq: Four times a day (QID) | ORAL | Status: DC | PRN
  Administered 2019-11-22 – 2019-12-21 (×7): 650 mg
  Filled 2019-11-22 (×7): qty 20.3

## 2019-11-22 MED ORDER — AMIODARONE HCL IN DEXTROSE 360-4.14 MG/200ML-% IV SOLN
INTRAVENOUS | Status: AC
  Administered 2019-11-22: 30 mg/h via INTRAVENOUS
  Filled 2019-11-22: qty 200

## 2019-11-22 MED ORDER — PRISMASOL BGK 4/2.5 32-4-2.5 MEQ/L REPLACEMENT SOLN
Status: DC
  Administered 2019-11-22: 5000 mL via INTRAVENOUS_CENTRAL
  Administered 2019-11-23 – 2019-11-25 (×4): via INTRAVENOUS_CENTRAL

## 2019-11-22 MED ORDER — PRISMASOL BGK 4/2.5 32-4-2.5 MEQ/L REPLACEMENT SOLN
Status: DC
  Administered 2019-11-22: 5000 mL via INTRAVENOUS_CENTRAL
  Administered 2019-11-23 – 2019-11-25 (×2): via INTRAVENOUS_CENTRAL

## 2019-11-22 MED ORDER — LIDOCAINE IN D5W 4-5 MG/ML-% IV SOLN
1.0000 mg/min | INTRAVENOUS | Status: DC
  Administered 2019-11-22: 1 mg/min via INTRAVENOUS

## 2019-11-22 MED ORDER — SODIUM CHLORIDE 0.9 % IV SOLN
2.0000 g | INTRAVENOUS | Status: AC
  Administered 2019-11-22 – 2019-11-27 (×6): 2 g via INTRAVENOUS
  Filled 2019-11-22 (×3): qty 2
  Filled 2019-11-22: qty 20
  Filled 2019-11-22 (×2): qty 2

## 2019-11-22 MED ORDER — ALTEPLASE 2 MG IJ SOLR: 2.0000 mg | Freq: Once | INTRAMUSCULAR | Status: DC | PRN

## 2019-11-22 MED ORDER — MIDAZOLAM HCL 2 MG/2ML IJ SOLN
2.0000 mg | INTRAMUSCULAR | Status: DC | PRN
  Administered 2019-11-24 – 2019-11-25 (×3): 2 mg via INTRAVENOUS
  Filled 2019-11-22 (×2): qty 2

## 2019-11-22 MED ORDER — LIDOCAINE BOLUS VIA INFUSION
50.0000 mg | Freq: Once | INTRAVENOUS | Status: AC
  Administered 2019-11-22: 50 mg via INTRAVENOUS
  Filled 2019-11-22: qty 52

## 2019-11-22 MED ORDER — HEPARIN SODIUM (PORCINE) 1000 UNIT/ML DIALYSIS
1000.0000 [IU] | INTRAMUSCULAR | Status: DC | PRN
  Administered 2019-11-24 – 2019-11-25 (×2): 2800 [IU] via INTRAVENOUS_CENTRAL
  Filled 2019-11-22 (×3): qty 6
  Filled 2019-11-22 (×2): qty 4

## 2019-11-22 MED ORDER — PANTOPRAZOLE SODIUM 40 MG PO PACK
40.0000 mg | PACK | Freq: Every day | ORAL | Status: DC
  Administered 2019-11-22 – 2019-12-23 (×31): 40 mg
  Filled 2019-11-22 (×34): qty 20

## 2019-11-22 MED ORDER — VITAL 1.5 CAL PO LIQD
1000.0000 mL | ORAL | Status: DC
  Administered 2019-11-22 – 2019-11-29 (×5): 1000 mL
  Filled 2019-11-22 (×10): qty 1000

## 2019-11-22 NOTE — Progress Notes (Signed)
NAME:  Jozeph Persing, MRN:  [id number], DOB:  [birth year], LOS: 6 ADMISSION DATE:  11/16/2019, CONSULTATION DATE:  12/2 REFERRING MD:  Dr. Sedonia Small, CHIEF COMPLAINT:  Post cardiac arrest    Brief History   53yo male presented post arrest which occurred during iHD treatment. This was a witnessed arrest with quick bystander CPR. Received 3 rounds of epi, 1g of calcium and 50 mEq of bicarb with ROSC after 15 minutes.   History of present illness   HPI obtained from medical chart review as patient is currently intubated on mechanical ventilation.  53 year old male with extensive PMH significant for but not limited to NSVT, combined systolic and diastolic CHF, polysubstance abuse, and  end-stage renal disease who presented to the ED after cardiac arrest.  Patient had presented to outpatient hemodialysis appointment 3-1/2 hours into HD treatment when patient suffered a witnessed cardiac arrest. On arrival to EMS patients rhythm was PEA.  Patient received 3 rounds of epi, 1g of calcium and 50 mEq of bicarb with ROSC after 15 minutes. Emergent cricothyrotomy performed by EMS due to failed intubation with [facility] airway.   Vital signs stable on admission, has remained in sinus rhythm since ROSC.  ABG on admission pH 7.227 / pCO2 65.7 / pO2 349.0 / bicarb 27.3. Troponin 171, creatinine 7.01 and  lactic acid 5.9.   PCCM consulted for admission   Past Medical History  NSVT 2017 Nonischemic cardiomyopathy Medical noncompliance Polysubstance abuse Hypertension End-stage renal disease Combined systolic and diastolic congestive heart failure Cocaine abuse Alcohol abuse Prolonged QTC  Significant Hospital Events   12/2 admitted post cardiac arrest  Consults:   Nephrology Electrophysiology  Procedures:  12/2 remeasuring cricothyrotomy per EMS  Significant Diagnostic Tests:  CXR 12/2 > Worsened right greater than left perihilar opacities most consistent with edema (versus atypical inflammation). Tip  of the tracheostomy tube in the proximal trachea, just below the thoracic inlet.  Micro Data:  COVID 12/2 > negative Aspirate 12/04+ for Haemophilus influenza all/2 blood cultures no growth to date Antimicrobials:  Unasyn 12/4>>12/7 Ceftriazone 12/7>>  Interim history/subjective:   This morning had a V. fib arrest, got shocked twice.  Objective   Blood pressure 95/65, pulse 70, temperature (!) 101.3 F (38.5 C), resp. rate (!) 28, height 5' 10.5" (1.791 m), weight 75.3 kg, SpO2 97 %. CVP:  [16 mmHg-18 mmHg] (P) 17 mmHg  Vent Mode: PRVC FiO2 (%):  [40 %-50 %] 50 % Set Rate:  [25 bmp-28 bmp] 28 bmp Vt Set:  [440 mL] 440 mL PEEP:  [5 cmH20] 5 cmH20 Plateau Pressure:  [19 cmH20-25 cmH20] 19 cmH20   Intake/Output Summary (Last 24 hours) at 11/22/2019 1412 Last data filed at 11/22/2019 0800 Gross per 24 hour  Intake 2272.52 ml  Output 1734 ml  Net 538.52 ml   Filed Weights   11/20/19 0756 11/21/19 0600 11/22/19 0545  Weight: 81.4 kg 74.6 kg 75.3 kg    Examination: General: Obese male vent to cric.  HEENT: Emergency cric is tied in place Neuro: grimacing. Spontaneously moves eyes and lower extremities, intermittently follows commands, very weak not moving his left arm. CV: Heart sounds are regular, no mrg PULM: Coarse rhonchi GI: soft, bsx4 active  GU: None Extremities: warm/dry,  edema left bicep graft positive bruit Skin: no rashes or lesions Lines: Right femoral HD catheter, left femoral arterial line, right subclavian central venous catheter  Resolved Hospital Problem list     Assessment & Plan:  Cardiac arrest -Unknown etiology, pt has  hx of combined systolic and diastolic CHF, NSVT, prolonged QT, and polysubstance abuse  Cardiogenic shock -~15 minutes downtime, witnessed arrest with quick initiation of bystander CPR P: S/p TTM goal 33 degrees Central line and arterial line been placed in the groin Appreciate cardiology's input Continue midodrine, still  requiring norepinephrine  Acute hypoxic respiratory failure Emergent cricothyrotomy   -In the setting of post cardiac arrest  P: Full ventilatory support Vent bundle Lung protective ventilation, decrease tidal volume to 6 cc/kg Will discuss tracheostomy with ENT as neurologic function improves.  Today meets criteria for SBT.  Will perform.  New onset A-fib HX NSVT/V. fib arrest HX Prolonged QT -EKG on admission with A-fib and prolonged QT P: Continuous telemetry  Rate control as able, however will attempt to just wean norepinephrine right now. TSH is 7, not suppressed. Cardiology consulted   On heparin drip He is now on amiodarone drip again, as well as lidocaine infusion. 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  He is very weak, and is now intermittently following commands. I did get a CT head today which did not demonstrate any acute intracranial hemorrhage or evidence of embolic phenomenon. Delirium precautions EEG shows profound diffuse encephalopathy Suspect ESRD status makes clearing sedation agents challenging.  He is profoundly deconditioned.  ESRD -Undergoing iHD at time of arrest, assuming regular schedule is MWF P: Nephrology is following CRRT was stopped this morning during the arrest.  Will resume today.  Polysubstance abuse -Hx of cocaine, alcohol, and tobacco abuse P: PAD protocol   Best practice:  Diet: tube feeds at goal Pain/Anxiety/Delirium protocol (if indicated): fentanyl infusion VAP protocol  (if indicated): yes, ordered DVT prophylaxis: heparin gtt GI prophylaxis: PPI Glucose control: yes less than 180 Foley: none Mobility: bed rest Code Status: Full Family Communication: I updated his son Cartez at the bedside today.  Disposition: needs ICU  Labs   CBC: Recent Labs  Lab 11/16/19 1116  11/17/19 0500  11/19/19 0402  11/20/19 0448 11/21/19 0320 11/21/19 0321 11/22/19 0408 11/22/19 0425 11/22/19 0603  WBC 8.8   < > 11.1*  --  7.0  --  8.1  --  9.3 11.3*  --   --   NEUTROABS 5.6  --   --   --  6.1  --  7.7  --  7.8* 8.8*  --   --   HGB 13.1   < > 12.2*   < > 11.3*   < > 10.5* 11.9* 10.7* 10.4* 12.2* 11.9*  HCT 42.5   < > 37.0*   < > 35.5*   < > 32.6* 35.0* 33.2* 31.2* 36.0* 35.0*  MCV 100.2*   < > 93.0  --  96.7  --  95.3  --  94.3 93.1  --   --   PLT 170   < > 125*  --  125*  --  110*  --  113* 115*  --   --    < > = values in this interval not displayed.    Basic Metabolic Panel: Recent Labs  Lab 11/19/19 0312  11/19/19 1453  11/20/19 0447 11/20/19 1600  11/21/19  0321 11/21/19 1550 11/22/19 0408 11/22/19 0425 11/22/19 0603  NA 135   < > 135   < > 135 135   < > 135 132* 135 135 135  K 4.9   < > 4.3   < > 4.4 4.4   < > 4.2 4.2 4.3 5.0 4.0  CL 101  --  102  --  103 102  --  101 101 101  --   --   CO2 22  --  23  --  20* 21*  --  22 22 22   --   --   GLUCOSE 92  --  112*  --  146* 100*  --  103* 106* 109*  --   --   BUN 25*  --  19  --  19 20  --  14 14 15   --   --   CREATININE 4.45*  --  3.03*  --  2.35* 2.00*  --  1.66* 1.72* 1.70*  --   --   CALCIUM 7.9*  --  8.1*  --  8.4* 8.2*  --  8.3* 8.2* 8.3*  --   --   MG 2.2  --  2.3  --  2.5*  --   --  2.4  --  2.6*  --   --   PHOS 3.9  4.0  --  2.2*  --  2.2* 1.9*  --  1.8* 1.6* 1.2*  --   --    < > = values in this interval not displayed.   GFR: Estimated Creatinine Clearance: 54 mL/min (A) (by C-G formula based on SCr of 1.7 mg/dL (H)). Recent Labs  Lab 11/16/19 1101  11/16/19 1346 11/16/19 1357  11/16/19 1740 11/16/19 2005 11/17/19 0500 11/18/19 0306 11/19/19 0402 11/20/19 0448 11/21/19 0321 11/22/19 0408  PROCALCITON  --   --   --  0.69  --   --  24.16 31.62  --   --   --   --   WBC  --    < >  --   --   --   --  11.1*  --  7.0 8.1 9.3 11.3*  LATICACIDVEN 5.9*  --  4.4*  --  1.2 1.3  --   --   --   --   --   --    < > = values in this interval not displayed.    Liver Function Tests: Recent Labs  Lab 11/16/19 1116  11/20/19 0447 11/20/19 1600 11/21/19 0321 11/21/19 1550 11/22/19 0408  AST 30  --   --   --   --   --   --   ALT 17  --   --   --   --   --   --   ALKPHOS 98  --   --   --   --   --   --   BILITOT 1.2  --   --   --   --   --   --   PROT 7.7  --   --   --   --   --   --   ALBUMIN 3.5   < > 3.0* 2.8* 2.7* 2.7* 2.5*   < > = values in this interval not displayed.   No results for input(s): LIPASE, AMYLASE in the last 168 hours. No results for input(s): AMMONIA in the last 168 hours.  ABG    Component Value Date/Time   PHART 7.391 11/22/2019  0603   PCO2ART 38.3 11/22/2019 0603   PO2ART 87.0 11/22/2019 0603   HCO3 23.3 11/22/2019 0603   TCO2 24 11/22/2019 0603   ACIDBASEDEF 1.0 11/22/2019 0603   O2SAT 97.0 11/22/2019 0603     Coagulation Profile: Recent Labs  Lab 11/16/19 1238 11/16/19 2005  INR 1.5* 1.6*    Cardiac Enzymes: No results for input(s): CKTOTAL, CKMB, CKMBINDEX, TROPONINI in the last 168 hours.  HbA1C: Hgb A1c MFr Bld  Date/Time Value Ref Range Status  03/10/2011 12:02 PM (H) <5.7 % Final   5.7 (NOTE)                                                                       According to the ADA Clinical Practice Recommendations for 2011, when HbA1c is used as a screening test:   >=6.5%   Diagnostic of Diabetes Mellitus           (if abnormal result  is confirmed)  5.7-6.4%   Increased risk of developing Diabetes Mellitus  References:Diagnosis and Classification of Diabetes Mellitus,Diabetes OXBD,5329,92(EQAST 1):S62-S69 and  Standards of Medical Care in         Diabetes - 2011,Diabetes MHDQ,2229,79  (Suppl 1):S11-S61.    CBG: Recent Labs  Lab 11/21/19 1552 11/21/19 1935 11/22/19 0005 11/22/19 0402 11/22/19 0913  GLUCAP 104* 110* 108* 113* 105*    The patient is critically ill with multiple organ systems failure and requires high complexity decision making for assessment and support, frequent evaluation and titration of therapies, application of advanced monitoring technologies and extensive interpretation of multiple databases.   Critical Care Time devoted to patient care services described in this note is 48 minutes. This time reflects time of care of this George . This critical care time does not reflect separately billable procedures or procedure time, teaching time or supervisory time of PA/NP/Med student/Med Resident etc but could involve care discussion time.  [facility] 11/22/2019 2:12 PM  Pager: [phone] After hours pager: [phone]

## 2019-11-22 NOTE — Code Documentation (Addendum)
  Patient Name: [name] [name]   MRN: [id number]   Date of Birth/ Sex: 10-06-1966 , male      Admission Date: 11/16/2019  Attending Provider: Spero Geralds, MD  Primary Diagnosis: Cardiogenic shock   Indication: Pt was in his usual state of health until this PM, when he was noted to be in [place]. Code blue was subsequently called. At the time of arrival on scene, ACLS protocol was underway.   Technical Description:  - CPR performance duration:  6 minutes  - Was defibrillation or cardioversion used? Yes   - Was external pacer placed? No  - Was patient intubated pre/post CPR? Yes   Medications Administered: Y = Yes; Blank = No Amiodarone    Atropine    Calcium    Epinephrine    Lidocaine    Magnesium    Norepinephrine    Phenylephrine    Sodium bicarbonate    Vasopressin     Post CPR evaluation:  - Final Status - Was patient successfully resuscitated ? Yes - What is current rhythm? Normal sinus - What is current hemodynamic status? yes  Miscellaneous Information:  - Labs sent, including:   - Primary team notified?  Yes  - Family Notified? No  - Additional notes/ transfer status: Critical care was already at bedside when imts arrived. The patient had been going in and out of vtach. Cardiology was called and they took over management.      Lars Mage, MD  11/22/2019, 4:46 AM

## 2019-11-22 NOTE — Progress Notes (Addendum)
Per dayshift RN, MD wants patient MAP kept above 50.

## 2019-11-22 NOTE — Consult Note (Addendum)
Cardiology Consultation:   Patient ID: [name] [name] MRN: [id number]; DOB: Jul 15, 1966  Admit date: 11/16/2019 Date of Consult: 11/22/2019  Primary Care Provider: Jean Rosenthal, MD Primary Cardiologist: Fransico Him, MD  Primary Electrophysiologist:  None    Patient Profile:   [name] [name] is a 53 y.o. male with a hx of chronic CHF (combined), ESRF onHD, poly substance abuse (ETOH, cocaine), h/o VT in setting of cocaine, HTN urgency (2011), lost to cardiology follow up since 2018 who is being seen today for the evaluation of VT arrest at the request of Dr. Shearon Stalls.  History of Present Illness:   [name] [name] was admitted to [facility] 11/16/2019 after a PEA arrest he had while on HD, he received ACL via center and EMS services, received CPR, 3 rounds of epi, 1g of calcium and 50 mEq of bicarbwith ROSC after approx 15 minutes post arrest with new Afib. Cardiology was consulted, TTE day of admission noted LVEF 15-20% as well as RV dysfunction, he was intubated, sedated, being cooled, on pressors.  Low suspicion for CAD with 2017 cath with no CAD and no acute EKG changes, suspected not to be candidate for advanced CHF therapies or ICD given social situation and substance abuse and poor long term prognosis.  Anticoagulated with heparin gtt for his new AF that was rate controlled post arrest  He underwent emergent cricothyrotomy by EMS prior to arrival. CT Chest with RML and LLL PE, large right pleural effusion with new loculated collection of fluid at the base the left hemithorax, and collapse/consolidation in the RML and RLL possibly 2/2 aspiration.  EEG suggestive of profound diffuse encephalopathy COVID negative CRRT initiated  Cardiology followed, noted development of prolonged QT suspected 2/2 cooling (not present on initial EKG), not felt to be an ischemic event, not planned for ischemic w/u, Etiology unclear ? PE, aspiration, metabolic, acute volume shift in setting of poor LV function.  They last  saw him 11/19/2019, Overall prognosis here is very poor, the patient is not a candidate for any advanced therapies given chronic CHF, CKD stage V and ongoing drug abuse.  We will not plan for any ischemic workup. Volume management to be managed with HD, and signed off case  Neurologic recovery has been slow  He remains on pressors (weaning) with the addition of midodrine, remains intubated (via cricothyrotomy, pending ENT poss trach pending neurologic recovery) and is febrile today 101.3 He had VT arrest this AM, he was started on amiodarone gtt as well as lidocaine gtt Required shock successful with recurrent VT x2 more events with total 3 shocks CRRT was stopped.  He was hypotensive and levophed was increased, seems cards overnight responded to code, EP was asked by cardiology service to weigh in  LABS Admission I don't see a tox screen was done COVID negative  Resp panel H. Influenza BC (x1) negative x5 days  Today  K+ 4,3 > 5.0 > 4.0 BUN/Creat 15/1.70 Mag 2.6 WBC 11.3 H/H 11.9/35.o Plts 115  ABG (at code) PH 7.282 > 7.391 PCO2 51.8 > 38.3 PO2 56 > 87 HCO2 24.6 > 23.3 O2 sat 98%   Amiodarone gtt started at admission (peesumably for AF ?) 11/17/2019 - 11/20/2019 PO(tube) amiodarone started 12/6   amio gtt resumed with code  Lidocaine bolus and gtt started today  Also on midodrine 60m TID  Rocephin Fentanyl Heparin gtt Levophed at 38  Heart Pathway Score:     Past Medical History:  Diagnosis Date  . Abdominal distension 08/10/2018  .  Alcohol abuse 11/26/2010   Qualifier: Diagnosis of  By: [name] MD, Pershing Proud  . Anemia due to blood loss, acute 07/15/2012  . CHF (congestive heart failure) ([place])   . Cholecystitis 04/18/2014  . Cigarette smoker    has currently quit  . Cocaine abuse ([place]) 11/26/2010   Qualifier: Diagnosis of  By: [name] MD, Benjamine Mola  has been over a year  . Combined congestive systolic and diastolic heart failure ([place]) 04/18/2014   A.  Echo 8/13: Severe LVH, EF 40-45%, inferoposterior akinesis, grade 2 diastolic dysfunction, moderate LAE, mild RVE, mildly reduced RVSF, mild RAE; cannot rule out R atrial mass-suggest TEE or cardiac MRI  //  B. Echo 3/17: Mild LVH, EF 25-30%, diffuse HK, grade 2 diastolic dysfunction, mild MR, severe LAE,  moderately reduced RVSF, severe RAE, mild TR, moderate PI, PASP 55 mmHg    . Dyspnea   . ESRD (end stage renal disease) on dialysis [facility])    MWF- [place] (05/18/2017)  . Hemodialysis patient ([place])    M,W,F  . Hernia, inguinal, right 08/05/2016  . Hypertension   . 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 Hypocalcemia 07/15/2012  . Medical non-compliance   . Non-ischemic cardiomyopathy ([place])    A. R/L HC 3/17: Normal coronary arteries, moderate pulmonary hypertension (PASP 65 mmHg), elevated LV filling pressures (LVEDP 45 mmHg)   . NSVT (nonsustained ventricular tachycardia) ([place]) 02/22/2016  . NSVT (nonsustained ventricular tachycardia) ([place]) 02/22/2016  . Pneumonia 11/12/2017  . Polysubstance abuse ([place])   . Prolonged Q-T interval on ECG 05/18/2017  . Prolonged QT interval 05/18/2017  . Restless leg syndrome   . Solitary pulmonary nodule 10/10/2015   See cxr 10/09/2015 - CT rec 10/10/2015 >>>   . Upper airway cough syndrome 10/09/2015   Off ACEi around 1st Oct 2016  - Sinus CT 10/10/2015 >>>      Past Surgical History:  Procedure Laterality Date  . [place] TRANSPOSITION  01/19/2013   Procedure: BASCILIC VEIN TRANSPOSITION;  Surgeon: Conrad Fairview, MD;  Location: [place];  Service: Vascular;  Laterality: Left;  left 2nd stage basilic vein transposition  . CARDIAC CATHETERIZATION N/A 02/25/2016   Procedure: Right/Left Heart Cath and Coronary Angiography;  Surgeon: Burnell Blanks, MD;  Location: [place] CV LAB;  Service: Cardiovascular;  Laterality: N/A;  .  INGUINAL HERNIA REPAIR Right 08/05/2016   Procedure: RIGHT INGUINAL HERNIA REPAIR WITH MESH;  Surgeon: Donnie Mesa, MD;  Location: [place];  Service: General;  Laterality: Right;  . INSERTION OF DIALYSIS CATHETER  07/17/2012   Procedure: INSERTION OF DIALYSIS CATHETER;  Surgeon: Conrad Towns, MD;  Location: [place];  Service: Vascular;  Laterality: Right;  right internal jugular  . INSERTION OF MESH Right 08/05/2016   Procedure: INSERTION OF MESH;  Surgeon: Donnie Mesa, MD;  Location: [place];  Service: General;  Laterality: Right;  . REVISION OF ARTERIOVENOUS GORETEX GRAFT Left 04/16/5464   Procedure: PLICATION OF ARTERIOVENOUS FISTULA LEFT ARM;  Surgeon: Elam Dutch, MD;  Location: [place];  Service: Vascular;  Laterality: Left;  . REVISON OF ARTERIOVENOUS FISTULA Left 01/06/2017   Procedure: REVISON OF ARTERIOVENOUS FISTULA;  Surgeon: Conrad [place], MD;  Location: [place];  Service: Vascular;  Laterality: Left;  . REVISON OF ARTERIOVENOUS FISTULA Left 10/27/2018   Procedure: REVISION PLICATION OF ARTERIOVENOUS FISTULA  LEFT UPPER ARM;  Surgeon: Waynetta Sandy, MD;  Location: [place];  Service: Vascular;  Laterality: Left;  . UMBILICAL HERNIA REPAIR N/A 08/05/2016   Procedure: [place];  Surgeon: Donnie Mesa, MD;  Location: [place];  Service: General;  Laterality: N/A;  . VENOGRAM N/A 08/09/2012   Procedure: VENOGRAM;  Surgeon: Conrad Bennington, MD;  Location[facility] C[facility] LAB;  Service: Cardiovascular;  Laterality: N/A;       Inpatient Medications: Scheduled Meds: . artificial tears  1 application Both Eyes O2V  . B-complex with vitamin C  1 tablet Per Tube Daily  . chlorhexidine gluconate (MEDLINE KIT)  15 mL Mouth Rinse BID  . Chlorhexidine Gluconate Cloth  6 each Topical Daily  . docusate  100 mg Per Tube Daily  . feeding supplement (PRO-STAT SUGAR FREE 64)  30 mL Per Tube TID  . fentaNYL (SUBLIMAZE) injection  100 mcg Intravenous Once  . mouth rinse  15 mL Mouth  Rinse 10 times per day  . midazolam  2 mg Intravenous Once  . midodrine  5 mg Per Tube Q8H  . pantoprazole sodium  40 mg Per Tube QHS  . sodium chloride flush  10-40 mL Intracatheter Q12H  . white petrolatum   Topical Daily   Continuous Infusions: .  prismasol BGK 4/2.5 400 mL/hr at 11/22/19 0443  .  prismasol BGK 4/2.5 200 mL/hr at 11/22/19 0100  . sodium chloride Stopped (11/17/19 0836)  . sodium chloride 10 mL/hr at 11/22/19 0800  . amiodarone 30 mg/hr (11/22/19 1038)  . cefTRIAXone (ROCEPHIN)  IV    . dextrose 50 mL/hr at 11/22/19 0800  . feeding supplement (VITAL AF 1.2 CAL) Stopped (11/20/19 1255)  . fentaNYL infusion INTRAVENOUS 150 mcg/hr (11/22/19 0800)  . heparin 1,500 Units/hr (11/22/19 0800)  . lidocaine 1 mg/min (11/22/19 0800)  . norepinephrine (LEVOPHED) Adult infusion 42 mcg/min (11/22/19 0800)  . prismasol BGK 4/2.5 1,700 mL/hr at 11/22/19 0346   PRN Meds: sodium chloride, acetaminophen (TYLENOL) oral liquid 160 mg/5 mL, acetaminophen, fentaNYL, heparin, sodium chloride, sodium chloride flush  Allergies:    Allergies  Allergen Reactions  . Losartan Cough    Social History:   Social History   Socioeconomic History  . Marital status: Single    Spouse name: Not on file  . Number of children: Not on file  . Years of education: Not on file  . Highest education level: Not on file  Occupational History  . Not on file  Social Needs  . Financial resource strain: Not on file  . Food insecurity    Worry: Not on file    Inability: Not on file  . Transportation needs    Medical: Not on file    Non-medical: Not on file  Tobacco Use  . Smoking status: Former Smoker    Years: 30.00    Types: Cigarettes    Quit date: 10/20/2018    Years since quitting: 1.0  . Smokeless tobacco: Never Used  Substance and Sexual Activity  . Alcohol use: Yes    Alcohol/week: 4.0 standard drinks    Types: 4 Cans of beer per week  . Drug use: No    Types: Marijuana    Comment:  05/18/2017 "qd"  . Sexual activity: Not Currently  Lifestyle  . Physical activity    Days per week: Not on file    Minutes per session: Not on file  . Stress: Not on file  Relationships  . Social connections  Talks on phone: Not on file    Gets together: Not on file    Attends religious service: Not on file    Active member of club or organization: Not on file    Attends meetings of clubs or organizations: Not on file    Relationship status: Not on file  . Intimate partner violence    Fear of current or ex partner: Not on file    Emotionally abused: Not on file    Physically abused: Not on file    Forced sexual activity: Not on file  Other Topics Concern  . Not on file  Social History Narrative  . Not on file    Family History:    Family History  Problem Relation Age of Onset  . Hypertension Mother   . Diabetes Mother   . Renal Disease Mother   . Hypertension Father      ROS:  Please see the history of present illness.  All other ROS reviewed and negative.     Physical Exam/Data:   Vitals:   11/22/19 0645 11/22/19 0700 11/22/19 0740 11/22/19 0745  BP:   (!) 92/51   Pulse: 73 72 78   Resp: 20 (!) 24 (!) 28   Temp: (!) 101.3 F (38.5 C)     TempSrc:    (P) Rectal  SpO2: 97% 97% 98%   Weight:      Height:        Intake/Output Summary (Last 24 hours) at 11/22/2019 1042 Last data filed at 11/22/2019 0800 Gross per 24 hour  Intake 2742.72 ml  Output 2318 ml  Net 424.72 ml   Last 3 Weights 11/22/2019 11/21/2019 11/20/2019  Weight (lbs) 166 lb 0.1 oz 164 lb 7.4 oz 179 lb 7.3 oz  Weight (kg) 75.3 kg 74.6 kg 81.4 kg     Body mass index is 23.48 kg/m.  General:  Well nourished, intibated Lymph: no adenopathy Neck: no JVD Endocrine: not assessed Vascular: No carotid bruits  Cardiac: RRR; no murmurs, gallops or rubs are appreciated Lungs:  Diminished at the base (on vent) Abd: soft, Ext: no edema Musculoskeletal:  No obvious deformities Skin: warm and dry   Neuro:  He opens eyes to verbal, does not blick to threat (attending at bedside) does not reliably follow commands, though does seem to look towards voice Psych:  Unable to assess   EKG:  The EKG was personally reviewed and demonstrates:   admit (much artifact), irregular, 136bpm, no clear ischemic changes, no clear QT prolongation) AFib 96, PVCs (no QT prolongation) 12/3 AFib 68bpm, QTc 591 (cooled) 11/18/2019 AFib 119, artifact, QT looks Ok 12/5 AFib 118, PVC 12/7 AFib 105 PVCs  Today post code: regular rhythm, looks accelerated junctional 72bpm, icRBBB, QRS 165m, measured QT 4412m Qtc 48268mTelemetry:  Telemetry was personally reviewed and demonstrates:    AFib w/CVR >> VT >> post shocks appears accel junctional rhythm 70's  Relevant CV Studies:   Echo 11/11/19: Study Conclusions - Left ventricle: The cavity size was mildly dilated. Systolic function was severely reduced. The estimated ejection fraction was in the range of 25% to 30%. Diffuse hypokinesis. Although no diagnostic regional wall motion abnormality was identified, this possibility cannot be completely excluded on the basis of this study. Features are consistent with a pseudonormal left ventricular filling pattern, with concomitant abnormal relaxation and increased filling pressure (grade 2 diastolic dysfunction). - Aortic valve: Transvalvular velocity was within the normal range. There was no stenosis. There was no  regurgitation. - Mitral valve: Transvalvular velocity was within the normal range. There was no evidence for stenosis. There was trivial regurgitation. - Left atrium: The atrium was severely dilated. - Right ventricle: The cavity size was moderately dilated. Wall thickness was normal. Systolic function was moderately reduced. RV systolic pressure (S, est): 47 mm Hg. - Right atrium: The atrium was moderately dilated. - Tricuspid valve: There was mild-moderate regurgitation.  - Pulmonic valve: There was trivial regurgitation. - Pulmonary arteries: The main pulmonary artery was mildly dilated. Systolic pressure was moderately increased. PA peak pressure: 47 mm Hg (S). - Inferior vena cava: The vessel was dilated. The respirophasic diameter changes were blunted (<50%), consistent with elevated central venous pressure. - Pericardium, extracardiac: There was no pericardial effusion.    02/25/2016: LHC . No angiographic evidence of CAD 2. Moderate pulmonary HTN 3. Elevated LV filling pressures.    11/11/2019 25-30% 02/21/16 LVEF 25-30% 2014 LVEF 40-45%   Laboratory Data:  High Sensitivity Troponin:   Recent Labs  Lab 11/16/19 1141 11/16/19 1238 11/16/19 1730  TROPONINIHS 171* 243* 679*     Chemistry Recent Labs  Lab 11/21/19 0321 11/21/19 1550 11/22/19 0408 11/22/19 0425 11/22/19 0603  NA 135 132* 135 135 135  K 4.2 4.2 4.3 5.0 4.0  CL 101 101 101  --   --   CO2 _0 --   --   GLUCOSE 103* 106* 109*  --   --   BUN _1 --   --   CREATININE 1.66* 1.72* 1.70*  --   --   CALCIUM 8.3* 8.2* 8.3*  --   --   GFRNONAA 47* 45* 46*  --   --   GFRAA 54* 52* 53*  --   --   ANIONGAP _2 --   --     Recent Labs  Lab 11/16/19 1116  11/21/19 0321 11/21/19 1550 11/22/19 0408  PROT 7.7  --   --   --   --   ALBUMIN 3.5   < > 2.7* 2.7* 2.5*  AST 30  --   --   --   --   ALT 17  --   --   --   --   ALKPHOS 98  --   --   --   --   BILITOT 1.2  --   --   --   --    < > = values in this interval not displayed.   Hematology Recent Labs  Lab 11/20/19 0448  11/21/19 0321 11/22/19 0408 11/22/19 0425 11/22/19 0603  WBC 8.1  --  9.3 11.3*  --   --   RBC 3.42*  --  3.52* 3.35*  --   --   HGB 10.5*   < > 10.7* 10.4* 12.2* 11.9*  HCT 32.6*   < > 33.2* 31.2* 36.0* 35.0*  MCV 95.3  --  94.3 93.1  --   --   MCH 30.7  --  30.4 31.0  --   --   MCHC 32.2  --  32.2 33.3  --   --   RDW 16.3*  --  16.3* 16.3*  --   --   PLT 110*   --  113* 115*  --   --    < > = values in this interval not displayed.   BNP Recent Labs  Lab 11/16/19 1356  BNP 962.8*    DDimer No results for input(s): DDIMER  in the last 168 hours.   Radiology/Studies:   Dg Abd 1 View Result Date: 11/20/2019 CLINICAL DATA:  Nasogastric tube placement EXAM: ABDOMEN - 1 VIEW COMPARISON:  Portable exam 1303 hours compared to 12/07 hours FINDINGS: Tip of feeding tube projects over mid stomach. Tip of nasogastric tube proxy acts over proximal stomach. Decreased gaseous distention of stomach since previous exam. Enlargement of cardiac silhouette with vascular congestion and probable pulmonary edema. Atelectasis versus consolidation at LEFT lower lobe. Minimal pleural effusions. IMPRESSION: Nasogastric tube and feeding tube projects over stomach as above. Electronically Signed   By: Lavonia Dana M.D.   On: 11/20/2019 13:49    Dg Chest Port 1 View Result Date: 11/22/2019 CLINICAL DATA:  Post code. EXAM: PORTABLE CHEST 1 VIEW COMPARISON:  Radiograph 11/20/2019. CT 11/16/2019 FINDINGS: Tracheostomy tube tip at the thoracic inlet. Two enteric tubes with tips below the diaphragm not included in the field of view. Right subclavian central line tip overlies the lower SVC. Stable cardiomegaly. Unchanged hazy lung base opacities consistent with pleural effusions. Slight improvement in interstitial and airspace opacities in the upper lobes from prior. No pneumothorax. IMPRESSION: 1. Slight improvement in interstitial and airspace opacities in the upper lobes from prior. 2. Unchanged pleural effusions and cardiomegaly. 3. Tracheostomy tube tip at the thoracic inlet. Electronically Signed   By: Keith Rake M.D.   On: 11/22/2019 05:15     Assessment and Plan:   1. Admitted 11/16/2019 post PEA arrest while at HD     Required pressors since admission     Midodrine added      Cardiology signed off 11/19/2019 no plans for ischemic w/u given no CAD by cath 2017     LVEF  25-30%  Now s/p VT arrest had 2 shocks, amio gtt and lido gtt started, no drugs otherwise given that I can tell, levo was increased  He has had minimal, very slow neurological recovery, not felt to be an ischemic w/u candidate by cardiology service s/o a few days ago  He is febrile today, COVID negative on admission, being treated for H Influenza infection  amio and lidocaine have quieted his VT so far  CCM at bedside planned for stat CT brain Remains pressor dependent CRRT stopped with code (on HD at baseline) 2/2 hypotension  Reportedly polysubstance abuse (no tix screen this admission) with ETOH, cocaine  He is in accelerated junctional rhythm post shock, 70's, icRBBB (new), QRS 179m Follow telemetry I did not see any VF or clear PMVT  on tele, QT looks OK  Follow lido level closely Continued supportive care Keep K+ >4.0 and mag >2.0  Goals of care will need to be established  Dr. ARayann Hemanhas seen the patient Will stop the lidocaine gtt Discussed briefly with CCM/attending MD, agree with goals of care discussions with family    For questions or updates, please contact [facility] Please consult [email or web address] for contact info under     Signed, RBaldwin Jamaica PA-C  11/22/2019 10:42 AM   I have seen, examined the patient, and reviewed the above assessment and plan.  Changes to above are made where necessary.  On exam, ill appearing, not currently responding.  The patient had a VT arrest this morning in the setting of profound acute medical illness and while on amiodarone. I worry that his prognosis is very poor.  He is currently not a candidate for EP procedures.  He has a chronically depressed ejection fraction and normal cors by cath in  2017.    Stop lidocaine.  Continue IV amiodarone.  Family is at bedside.  He has also had afib.  I would hold off on anticoagulation at this time and continue to follow.  Ultimately, goals of care should be discussed with the  family.  If he is extubated and makes substantial recovery, further EP discussions will need to be had at that time.  Co Sign: Thompson Grayer, MD 11/22/2019 5:26 PM

## 2019-11-22 NOTE — Progress Notes (Signed)
[facility] ROUNDING NOTE   Subjective:   Is a 53 year old gentleman history of SVT systolic heart failure polysubstance abuse end-stage renal disease Monday Wednesday Friday dialysis [facility] status post PEA arrest 11/16/2019.  Occurred about 3-1/2 hours into hemodialysis treatment received 3 rounds of epinephrine 1 g of calcium 50 mEq of bicarbonate with ROSC of about 15 minutes.  Emergent cricothyroitomy performed by EMS due to failed intubation.  EEG revealed profound diffuse encephalopathy nonspecific to etiology with no seizure or epileptiform discharges.  Intermittent episodes of atrial fibrillation started on IV amiodarone but discontinued due to increased QT interval.  Now on oral amiodarone 400 mg twice daily.  CRRT initiated 11/18/2019.   Daily Update:  arrested again this am, Vtach, CRRT held  I/O even yest, wt's stable 75kg  Wt's dow 80 >> 75kg   Objective:  Vital signs in last 24 hours:  Temp:  [97.7 F (36.5 C)-101.3 F (38.5 C)] 101.3 F (38.5 C) (12/08 0645) Pulse Rate:  [40-122] 70 (12/08 1151) Resp:  [12-29] 28 (12/08 1151) BP: (83-109)/(51-86) 95/65 (12/08 1151) SpO2:  [75 %-100 %] 97 % (12/08 1151) Arterial Line BP: (89-129)/(39-65) 103/47 (12/08 0700) FiO2 (%):  [40 %-50 %] 50 % (12/08 1151) Weight:  [75.3 kg] 75.3 kg (12/08 0545)  Weight change: -6.1 kg Filed Weights   11/20/19 0756 11/21/19 0600 11/22/19 0545  Weight: 81.4 kg 74.6 kg 75.3 kg    Intake/Output: I/O last 3 completed shifts: In: 4285.5 [I.V.:3530.7; NG/GT:240; IV Piggyback:514.9] Out: 0865 [Emesis/NG output:300; Other:5461]   Intake/Output this shift:  Total I/O In: 285.3 [I.V.:165.3; NG/GT:120] Out: -   Exam Cricothyrotomy, sedated, paralyzed LUE AVF +B/T Brady, nl s1s2 Coarse bs b/l S/mild distension Mild LE edema  HD OP: [facility] M[facility]  4h 16mn  74.5kg  2/2.5 bath  340/A1.5   P4  Hep 5000     C3 0.75 qTx     no ESA/Fe     Hb 12s usually  CXR  12/4 - bilat infiltrates / edema CXR 12/8 - no chg bilat layering effusion and IS/ alveloar infiltrates   Assessment:   ESRD- MWF HD.  Started on CRRT 11/19/2019.  Held this am post arrest, will resume now.   Volume - CVP up 16, CXR still looks wet, poss aspiration also> will resume UF 50- 100 cc/hr as tolerated.   Status post PEA arrest-  132m until ROSC.  ECHO LV = 20% ejection fraction. Seen by EP team.   New onset atrial fibrillation CHA2DS2-VASc score 3 IV heparin.  Started oral amiodarone  Chronic combined congestive heart failure:  EF 25-30% , now 20% not a candidate for ICD due to noncompliance and drug abuse  Prolonged QT interval unable to tolerate IV amiodarone  Cardiogenic shock: remains on pressors  ANEMIA-no major issue  MBD-severe chronic hyperphosphatemia  ACCESS-AV fistula  Diffuse nonspecific encephalopathy EEG performed   Emergent cricothyroidotomy / acute respiratory failure- per CCM  History of polysubstance abuse  CT scan of chest with suspicion for pulmonary embolus - IV heparin  RoKelly SplinterMD 11/22/2019, 12:38 PM    Basic Metabolic Panel: Recent Labs  Lab 11/19/19 0312  11/19/19 1453  11/20/19 0447 11/20/19 1600  11/21/19 0321 11/21/19 1550 11/22/19 0408 11/22/19 0425 11/22/19 0603  NA 135   < > 135   < > 135 135   < > 135 132* 135 135 135  K 4.9   < > 4.3   < > 4.4 4.4   < >  4.2 4.2 4.3 5.0 4.0  CL 101  --  102  --  103 102  --  101 101 101  --   --   CO2 22  --  23  --  20* 21*  --  _0 --   --   GLUCOSE 92  --  112*  --  146* 100*  --  103* 106* 109*  --   --   BUN 25*  --  19  --  19 20  --  _1 --   --   CREATININE 4.45*  --  3.03*  --  2.35* 2.00*  --  1.66* 1.72* 1.70*  --   --   CALCIUM 7.9*  --  8.1*  --  8.4* 8.2*  --  8.3* 8.2* 8.3*  --   --   MG 2.2  --  2.3  --  2.5*  --   --  2.4  --  2.6*  --   --   PHOS 3.9  4.0  --  2.2*  --  2.2* 1.9*  --  1.8* 1.6* 1.2*  --   --    < > = values in this interval not  displayed.    Liver Function Tests: Recent Labs  Lab 11/16/19 1116  11/20/19 0447 11/20/19 1600 11/21/19 0321 11/21/19 1550 11/22/19 0408  AST 30  --   --   --   --   --   --   ALT 17  --   --   --   --   --   --   ALKPHOS 98  --   --   --   --   --   --   BILITOT 1.2  --   --   --   --   --   --   PROT 7.7  --   --   --   --   --   --   ALBUMIN 3.5   < > 3.0* 2.8* 2.7* 2.7* 2.5*   < > = values in this interval not displayed.   No results for input(s): LIPASE, AMYLASE in the last 168 hours. No results for input(s): AMMONIA in the last 168 hours.  CBC: Recent Labs  Lab 11/16/19 1116  11/17/19 0500  11/19/19 0402  11/20/19 0448 11/21/19 0320 11/21/19 0321 11/22/19 0408 11/22/19 0425 11/22/19 0603  WBC 8.8   < > 11.1*  --  7.0  --  8.1  --  9.3 11.3*  --   --   NEUTROABS 5.6  --   --   --  6.1  --  7.7  --  7.8* 8.8*  --   --   HGB 13.1   < > 12.2*   < > 11.3*   < > 10.5* 11.9* 10.7* 10.4* 12.2* 11.9*  HCT 42.5   < > 37.0*   < > 35.5*   < > 32.6* 35.0* 33.2* 31.2* 36.0* 35.0*  MCV 100.2*   < > 93.0  --  96.7  --  95.3  --  94.3 93.1  --   --   PLT 170   < > 125*  --  125*  --  110*  --  113* 115*  --   --    < > = values in this interval not displayed.    Cardiac Enzymes: No results for input(s): CKTOTAL, CKMB, CKMBINDEX, TROPONINI in the last 168 hours.  BNP: Invalid input(s): POCBNP  CBG: Recent Labs  Lab 11/21/19 1552 11/21/19 1935 11/22/19 0005 11/22/19 0402 11/22/19 0913  GLUCAP 104* 110* 108* 113* 105*     Medications:   .  prismasol BGK 4/2.5 400 mL/hr at 11/22/19 0443  .  prismasol BGK 4/2.5 200 mL/hr at 11/22/19 0100  . sodium chloride Stopped (11/17/19 0836)  . sodium chloride 10 mL/hr at 11/22/19 0800  . amiodarone 30 mg/hr (11/22/19 1038)  . cefTRIAXone (ROCEPHIN)  IV    . dextrose 50 mL/hr at 11/22/19 0800  . feeding supplement (VITAL AF 1.2 CAL) Stopped (11/20/19 1255)  . fentaNYL infusion INTRAVENOUS 150 mcg/hr (11/22/19 0800)  .  heparin 1,500 Units/hr (11/22/19 0800)  . lidocaine 1 mg/min (11/22/19 0800)  . norepinephrine (LEVOPHED) Adult infusion 42 mcg/min (11/22/19 0800)  . prismasol BGK 4/2.5 1,700 mL/hr at 11/22/19 0346   . artificial tears  1 application Both Eyes L1E  . B-complex with vitamin C  1 tablet Per Tube Daily  . chlorhexidine gluconate (MEDLINE KIT)  15 mL Mouth Rinse BID  . Chlorhexidine Gluconate Cloth  6 each Topical Daily  . docusate  100 mg Per Tube Daily  . feeding supplement (PRO-STAT SUGAR FREE 64)  30 mL Per Tube TID  . fentaNYL (SUBLIMAZE) injection  100 mcg Intravenous Once  . mouth rinse  15 mL Mouth Rinse 10 times per day  . midazolam  2 mg Intravenous Once  . midodrine  5 mg Per Tube Q8H  . pantoprazole sodium  40 mg Per Tube QHS  . sodium chloride flush  10-40 mL Intracatheter Q12H  . white petrolatum   Topical Daily   sodium chloride, acetaminophen (TYLENOL) oral liquid 160 mg/5 mL, acetaminophen, fentaNYL, heparin, midazolam, sodium chloride, sodium chloride flush

## 2019-11-22 NOTE — Progress Notes (Signed)
Patient had a vtach arrest that lasted for approximately 3 minutes. Please see code blue sheet. ABG was obtained after code. Labs pending. Amiodarone and Lidocaine gtt was started. Verbal order from Duwayne Heck, MD to stop CRRT at this time. CRRT stopped at 0505 and 1.49ml of Heparin was placed into both ports each of the HD cath verified by Orvil Feil, RN. Please see MAR. Awaiting labs. Repeat ABG to be obtained.

## 2019-11-22 NOTE — Progress Notes (Signed)
Pt transported from 2H13 to CT and back without incdent.

## 2019-11-22 NOTE — Progress Notes (Addendum)
Called to bedside for cardiac arrest, vtach.  Code in progress, pt had received one defibrillation.  Pulse check + pulse, Hypotension SBT 60s, Increased levo from 25 to 40.  BP improved.  VTach spont developed 2 more times Defib x 2.  PVCs.  Started lidocaine gtt 1mg /min.  Cards fellow at bedside Already received amio load and is on amio po.   ABG with ph 7.278, 52.6/57 immediately after code.   Labs pending.  CXR pending.   Increased RR to 28 from 25, FIO2 to 60% from 40%.  Will repeat ABG at 5:10Am.    Hold CRRT for now.   Starting amio gtt per cards recs.  ( already received Loading dose, on oral, switch to IV gtt)  Family call attempted, no answer.

## 2019-11-22 NOTE — Progress Notes (Signed)
Nutrition Follow-up  DOCUMENTATION CODES:   Not applicable  INTERVENTION:   Tube Feeding:  Vital 1.5 at 20, goal 50 ml/hr Pro-Stat 30 mL TID Provides 2100 kcals, 126 g of protein and 912 mL of free water   NUTRITION DIAGNOSIS:   Inadequate oral intake related to acute illness as evidenced by NPO status.  Being addressed via TF   GOAL:   Patient will meet greater than or equal to 90% of their needs  Progressing   MONITOR:   Vent status, Labs, Weight trends, TF tolerance  REASON FOR ASSESSMENT:   Ventilator    ASSESSMENT:   53 yo male admitted post cardiac arrest at [facility], required emergent cricothyrotomy in the field. PMH includes ESRD on HD, HTN, CHF, cocaine and EtOH abuse, medical nonadherence  12/02 Admit post Cardiac Arrest, TTM 33 degrees 12/04 Gastric Cortrak placed, CRRT initiated 12/08 Arrested, CRRT to resume  Patient is currently intubated on ventilator support MV: 11.2 L/min Temp (24hrs), Avg:99.9 F (37.7 C), Min:97.7 F (36.5 C), Max:101.3 F (38.5 C)  Currently 75.3 kg; admit wt 81.4 kg. EDW 74.5 kg  Per RN, TF held on 12/06 for abdominal distention, has not been restarted. Abd xray on 12/06 with OG tip in stomach, gaseous distention. OG to suction with minimal output, +BM. Discussed resuming TF with CCM MD today, MD agreeable  Labs: phosphorus 1.2 (H), Creatinine 1.70, BUN wdl Meds: B-complex with C, D5 at 50 ml/hr, potassium phosphate  Diet Order:   Diet Order    None      EDUCATION NEEDS:   Not appropriate for education at this time  Skin:  Skin Assessment: Reviewed RN Assessment  Last BM:  12/8  Height:   Ht Readings from Last 1 Encounters:  11/16/19 5' 10.5" (1.791 m)    Weight:   Wt Readings from Last 1 Encounters:  11/22/19 75.3 kg    Ideal Body Weight:     BMI:  Body mass index is 23.48 kg/m.  Estimated Nutritional Needs:   Kcal:  2118 kcals  Protein:  120-160 g  Fluid:  1000 mL plus UOP   [employer]  MS, RDN, LDN, CNSC [phone] Pager  [phone] Weekend/On-Call Pager

## 2019-11-22 NOTE — Progress Notes (Signed)
ANTICOAGULATION CONSULT NOTE   Pharmacy Consult for Heparin Indication: atrial fibrillation and pulmonary embolus  Patient Measurements: Height: 5' 10.5" (179.1 cm) Weight: 166 lb 0.1 oz (75.3 kg) IBW/kg (Calculated) : 74.15  Vital Signs: Temp: 101.3 F (38.5 C) (12/08 0645) Temp Source: Oral (12/08 0400) BP: 109/61 (12/08 0600) Pulse Rate: 72 (12/08 0700)  Labs: Recent Labs    11/20/19 0448 11/20/19 0725  11/21/19 0321 11/21/19 1550 11/22/19 0408 11/22/19 0410 11/22/19 0425 11/22/19 0603  HGB 10.5*  --    < > 10.7*  --  10.4*  --  12.2* 11.9*  HCT 32.6*  --    < > 33.2*  --  31.2*  --  36.0* 35.0*  PLT 110*  --   --  113*  --  115*  --   --   --   HEPARINUNFRC  --  0.30  --  0.46  --   --  0.36  --   --   CREATININE  --   --    < > 1.66* 1.72* 1.70*  --   --   --    < > = values in this interval not displayed.    Estimated Creatinine Clearance: 54 mL/min (A) (by C-G formula based on SCr of 1.7 mg/dL (H)).  Assessment: 53 yo male with new onset afib and possible PE for heparin.   Heparin level at goal this morning at 0.3, no bleeding issues noted per RN.   Goal of Therapy:  Heparin level 0.3-0.7 units/ml  Monitor platelets by anticoagulation protocol: Yes   Plan:  Continue heparin at 1500 units/hr Monitor daily heparin level, CBC  Erin Hearing PharmD., BCPS Clinical Pharmacist 11/22/2019 7:23 AM

## 2019-11-23 ENCOUNTER — Inpatient Hospital Stay (HOSPITAL_COMMUNITY): Payer: Medicare Other

## 2019-11-23 DIAGNOSIS — J9601 Acute respiratory failure with hypoxia: Secondary | ICD-10-CM | POA: Diagnosis not present

## 2019-11-23 DIAGNOSIS — I469 Cardiac arrest, cause unspecified: Secondary | ICD-10-CM | POA: Diagnosis not present

## 2019-11-23 LAB — CBC WITH DIFFERENTIAL/PLATELET
Abs Immature Granulocytes: 1.21 10*3/uL — ABNORMAL HIGH (ref 0.00–0.07)
Basophils Absolute: 0.1 10*3/uL (ref 0.0–0.1)
Basophils Relative: 1 %
Eosinophils Absolute: 0.1 10*3/uL (ref 0.0–0.5)
Eosinophils Relative: 1 %
HCT: 32.8 % — ABNORMAL LOW (ref 39.0–52.0)
Hemoglobin: 10.8 g/dL — ABNORMAL LOW (ref 13.0–17.0)
Immature Granulocytes: 9 %
Lymphocytes Relative: 7 %
Lymphs Abs: 0.9 10*3/uL (ref 0.7–4.0)
MCH: 30.5 pg (ref 26.0–34.0)
MCHC: 32.9 g/dL (ref 30.0–36.0)
MCV: 92.7 fL (ref 80.0–100.0)
Monocytes Absolute: 1.9 10*3/uL — ABNORMAL HIGH (ref 0.1–1.0)
Monocytes Relative: 14 %
Neutro Abs: 9.6 10*3/uL — ABNORMAL HIGH (ref 1.7–7.7)
Neutrophils Relative %: 68 %
Platelets: 129 10*3/uL — ABNORMAL LOW (ref 150–400)
RBC: 3.54 MIL/uL — ABNORMAL LOW (ref 4.22–5.81)
RDW: 16.8 % — ABNORMAL HIGH (ref 11.5–15.5)
WBC: 13.8 10*3/uL — ABNORMAL HIGH (ref 4.0–10.5)
nRBC: 3.2 % — ABNORMAL HIGH (ref 0.0–0.2)

## 2019-11-23 LAB — RENAL FUNCTION PANEL
Albumin: 2.3 g/dL — ABNORMAL LOW (ref 3.5–5.0)
Albumin: 2.1 g/dL — ABNORMAL LOW (ref 3.5–5.0)
Anion gap: 16 — ABNORMAL HIGH (ref 5–15)
Anion gap: 15 (ref 5–15)
BUN: 22 mg/dL — ABNORMAL HIGH (ref 6–20)
BUN: 24 mg/dL — ABNORMAL HIGH (ref 6–20)
CO2: 17 mmol/L — ABNORMAL LOW (ref 22–32)
CO2: 16 mmol/L — ABNORMAL LOW (ref 22–32)
Calcium: 7.8 mg/dL — ABNORMAL LOW (ref 8.9–10.3)
Calcium: 7.5 mg/dL — ABNORMAL LOW (ref 8.9–10.3)
Chloride: 100 mmol/L (ref 98–111)
Chloride: 103 mmol/L (ref 98–111)
Creatinine, Ser: 2.17 mg/dL — ABNORMAL HIGH (ref 0.61–1.24)
Creatinine, Ser: 1.93 mg/dL — ABNORMAL HIGH (ref 0.61–1.24)
GFR calc Af Amer: 39 mL/min — ABNORMAL LOW (ref >60)
GFR calc Af Amer: 45 mL/min — ABNORMAL LOW (ref >60)
GFR calc non Af Amer: 34 mL/min — ABNORMAL LOW (ref >60)
GFR calc non Af Amer: 39 mL/min — ABNORMAL LOW (ref >60)
Glucose, Bld: 89 mg/dL (ref 70–99)
Glucose, Bld: 197 mg/dL — ABNORMAL HIGH (ref 70–99)
Phosphorus: 3.3 mg/dL (ref 2.5–4.6)
Phosphorus: 2.6 mg/dL (ref 2.5–4.6)
Potassium: 4.8 mmol/L (ref 3.5–5.1)
Potassium: 4.3 mmol/L (ref 3.5–5.1)
Sodium: 133 mmol/L — ABNORMAL LOW (ref 135–145)
Sodium: 134 mmol/L — ABNORMAL LOW (ref 135–145)

## 2019-11-23 LAB — GLUCOSE, CAPILLARY
Glucose-Capillary: 121 mg/dL — ABNORMAL HIGH (ref 70–99)
Glucose-Capillary: 130 mg/dL — ABNORMAL HIGH (ref 70–99)
Glucose-Capillary: 143 mg/dL — ABNORMAL HIGH (ref 70–99)
Glucose-Capillary: 87 mg/dL (ref 70–99)
Glucose-Capillary: 90 mg/dL (ref 70–99)
Glucose-Capillary: 98 mg/dL (ref 70–99)

## 2019-11-23 LAB — HEPARIN LEVEL (UNFRACTIONATED): Heparin Unfractionated: 0.55 IU/mL (ref 0.30–0.70)

## 2019-11-23 LAB — MAGNESIUM: Magnesium: 2.5 mg/dL — ABNORMAL HIGH (ref 1.7–2.4)

## 2019-11-23 MED ORDER — AMIODARONE IV BOLUS ONLY 150 MG/100ML
150.0000 mg | Freq: Once | INTRAVENOUS | Status: AC
  Administered 2019-11-23: 150 mg via INTRAVENOUS

## 2019-11-23 MED ORDER — OXYCODONE HCL 5 MG/5ML PO SOLN
5.0000 mg | Freq: Four times a day (QID) | ORAL | Status: DC
  Administered 2019-11-23 – 2019-11-28 (×17): 5 mg via ORAL
  Filled 2019-11-23 (×18): qty 5

## 2019-11-23 MED ORDER — OXYCODONE HCL 5 MG PO TABS: 5.0000 mg | ORAL_TABLET | Freq: Four times a day (QID) | ORAL | Status: DC

## 2019-11-23 MED ORDER — DEXMEDETOMIDINE HCL IN NACL 400 MCG/100ML IV SOLN
0.4000 ug/kg/h | INTRAVENOUS | Status: DC
  Administered 2019-11-23 (×2): 0.4 ug/kg/h via INTRAVENOUS
  Administered 2019-11-24: 1.2 ug/kg/h via INTRAVENOUS
  Filled 2019-11-23 (×4): qty 100

## 2019-11-23 MED ORDER — FENTANYL CITRATE (PF) 100 MCG/2ML IJ SOLN
50.0000 ug | INTRAMUSCULAR | Status: DC | PRN
  Administered 2019-11-25 – 2019-12-01 (×13): 50 ug via INTRAVENOUS
  Filled 2019-11-23 (×15): qty 2

## 2019-11-23 MED ORDER — MIDODRINE HCL 5 MG PO TABS
10.0000 mg | ORAL_TABLET | Freq: Three times a day (TID) | ORAL | Status: DC
  Administered 2019-11-23 – 2019-12-16 (×65): 10 mg
  Filled 2019-11-23 (×67): qty 2

## 2019-11-23 MED FILL — Medication: Qty: 1 | Status: AC

## 2019-11-23 NOTE — Progress Notes (Signed)
Dr. Lois Huxley made aware of the patient having an increasing amount of ectopy while on Amiodarone and Levophed. Orders received to obtain an ECG, give Amiodarone bolus, and to send am labs early.

## 2019-11-23 NOTE — Progress Notes (Signed)
NAME:  [name] [name], MRN:  [id number], DOB:  [birth year], LOS: 7 ADMISSION DATE:  11/16/2019, CONSULTATION DATE:  12/2 REFERRING MD:  Dr. Sedonia Small, CHIEF COMPLAINT:  Post cardiac arrest    Brief History   53yo male presented post arrest which occurred during iHD treatment. This was a witnessed arrest with quick bystander CPR. Received 3 rounds of epi, 1g of calcium and 50 mEq of bicarb with ROSC after 15 minutes.   History of present illness   HPI obtained from medical chart review as patient is currently intubated on mechanical ventilation.  53 year old male with extensive PMH significant for but not limited to NSVT, combined systolic and diastolic CHF, polysubstance abuse, and  end-stage renal disease who presented to the ED after cardiac arrest.  Patient had presented to outpatient hemodialysis appointment 3-1/2 hours into HD treatment when patient suffered a witnessed cardiac arrest. On arrival to EMS patients rhythm was PEA.  Patient received 3 rounds of epi, 1g of calcium and 50 mEq of bicarb with ROSC after 15 minutes. Emergent cricothyrotomy performed by EMS due to failed intubation with [facility] airway.   Vital signs stable on admission, has remained in sinus rhythm since ROSC.  ABG on admission pH 7.227 / pCO2 65.7 / pO2 349.0 / bicarb 27.3. Troponin 171, creatinine 7.01 and  lactic acid 5.9.   PCCM consulted for admission   Past Medical History  NSVT 2017 Nonischemic cardiomyopathy Medical noncompliance Polysubstance abuse Hypertension End-stage renal disease Combined systolic and diastolic congestive heart failure Cocaine abuse Alcohol abuse Prolonged QTC  Significant Hospital Events   12/2 admitted post cardiac arrest  Consults:   Nephrology Electrophysiology  Procedures:  12/2 remeasuring cricothyrotomy per EMS  Significant Diagnostic Tests:  CXR 12/2 > Worsened right greater than left perihilar opacities most consistent with edema (versus atypical inflammation). Tip  of the tracheostomy tube in the proximal trachea, just below the thoracic inlet.  Micro Data:  COVID 12/2 > negative Aspirate 12/04+ for Haemophilus influenza all/2 blood cultures no growth to date Antimicrobials:  Unasyn 12/4>>12/7 Ceftriazone 12/7>>  Interim history/subjective:   This morning had a V. fib arrest, got shocked twice.  Objective   Blood pressure 90/64, pulse 62, temperature 98.2 F (36.8 C), resp. rate 15, height 5' 10.5" (1.791 m), weight 74.4 kg, SpO2 100 %. CVP:  [7 mmHg-20 mmHg] 13 mmHg  Vent Mode: PSV;CPAP FiO2 (%):  [40 %-50 %] 40 % Set Rate:  [28 bmp] 28 bmp Vt Set:  [440 mL] 440 mL PEEP:  [5 cmH20] 5 cmH20 Pressure Support:  [12 cmH20] 12 cmH20 Plateau Pressure:  [19 cmH20-24 cmH20] 23 cmH20   Intake/Output Summary (Last 24 hours) at 11/23/2019 0918 Last data filed at 11/23/2019 0900 Gross per 24 hour  Intake 4486.51 ml  Output 4323 ml  Net 163.51 ml   Filed Weights   11/21/19 0600 11/22/19 0545 11/23/19 0411  Weight: 74.6 kg 75.3 kg 74.4 kg    Examination: General: Obese male vent to cric.  HEENT: Emergency cric is tied in place Neuro: eyes open, awake, follows commands CV: Heart sounds are regular, no mrg PULM: Coarse rhonchi GI: soft, bsx4 active  GU: None Extremities: warm/dry,  edema left bicep graft positive bruit Skin: no rashes or lesions Lines: Right femoral HD catheter, left femoral arterial line, right subclavian central venous catheter  Resolved Hospital Problem list     Assessment & Plan:  Cardiac arrest -Unknown etiology, pt has hx of combined systolic and diastolic CHF, NSVT, prolonged  QT, and polysubstance abuse  Cardiogenic shock -~15 minutes downtime, witnessed arrest with quick initiation of bystander CPR P: S/p TTM goal 33 degrees Central line and arterial line been placed in the groin Increase midodrine, still requiring norepinephrine despite lower MAP goals.   Acute hypoxic respiratory failure Emergent  cricothyrotomy   -In the setting of post cardiac arrest  P: Vent bundle Lung protective ventilation, tidal volume to 6 cc/kg Will do cric collar trial today. Need to talk to ENT Dr. Constance Holster today about converting him to tracheostomy.  On SBT today. Very weak.   New onset A-fib HX NSVT/V. fib arrest HX Prolonged QT -EKG on admission with A-fib and prolonged QT P: Continuous telemetry  Rate control as able, however will attempt to just wean norepinephrine right now. TSH is 7, not suppressed. Cardiology consulted   On heparin drip He is now on amiodarone drip again, as well as lidocaine infusion. Appreciate EP assistance  Chronic combined systolic and diastolic congestive heart failure Nonischemic cardiomyopathy  -ECHO 11/10/18 with EF of 25-30% and grade 2 diastolicdysfunction  -Right and left heart cath 02/25/2016 with no angiographic evidence of CAD P: Cardiology consult Holding antihypertensives 2D echo  Acute encephalopathy Weak but improving a little bit daily.  CT head today which did not demonstrate any acute intracranial hemorrhage or evidence of embolic phenomenon. Delirium precautions EEG shows profound diffuse encephalopathy Suspect ESRD status makes clearing sedation agents challenging.  He is profoundly deconditioned. Discontinue continuous fentanyl. Will do scheduled oxycodone and precedex as needed.   ESRD -Undergoing iHD at time of arrest, assuming regular schedule is MWF P: Nephrology is following Continue CRRT.   Polysubstance abuse -Hx of cocaine, alcohol, and tobacco abuse P: PAD protocol   Best practice:  Diet: tube feeds at goal Pain/Anxiety/Delirium protocol (if indicated): fentanyl infusion VAP protocol (if indicated): yes, ordered DVT prophylaxis: heparin gtt GI prophylaxis: PPI Glucose control: yes less than 180 Foley: none Mobility: bed rest Code Status: Full Family Communication: I updated his son Solon at the bedside today.   Disposition: needs ICU  Labs   CBC: Recent Labs  Lab 11/19/19 0402  11/20/19 0448  11/21/19 0321 11/22/19 0408 11/22/19 0425 11/22/19 0603 11/23/19 0300  WBC 7.0  --  8.1  --  9.3 11.3*  --   --  13.8*  NEUTROABS 6.1  --  7.7  --  7.8* 8.8*  --   --  9.6*  HGB 11.3*   < > 10.5*   < > 10.7* 10.4* 12.2* 11.9* 10.8*  HCT 35.5*   < > 32.6*   < > 33.2* 31.2* 36.0* 35.0* 32.8*  MCV 96.7  --  95.3  --  94.3 93.1  --   --  92.7  PLT 125*  --  110*  --  113* 115*  --   --  129*   < > = values in this interval not displayed.    Basic Metabolic Panel: Recent Labs  Lab 11/19/19 1453  11/20/19 0447  11/21/19 0321 11/21/19 1550 11/22/19 0408 11/22/19 0425 11/22/19 0603 11/22/19 1652 11/23/19 0300  NA 135   < > 135   < > 135 132* 135 135 135 131* 133*  K 4.3   < > 4.4   < > 4.2 4.2 4.3 5.0 4.0 4.6 4.8  CL 102  --  103   < > 101 101 101  --   --  100 100  CO2 23  --  20*   < >  22 22 22   --   --  20* 17*  GLUCOSE 112*  --  146*   < > 103* 106* 109*  --   --  130* 89  BUN 19  --  19   < > 14 14 15   --   --  24* 22*  CREATININE 3.03*  --  2.35*   < > 1.66* 1.72* 1.70*  --   --  2.40* 2.17*  CALCIUM 8.1*  --  8.4*   < > 8.3* 8.2* 8.3*  --   --  7.8* 7.8*  MG 2.3  --  2.5*  --  2.4  --  2.6*  --   --   --  2.5*  PHOS 2.2*  --  2.2*   < > 1.8* 1.6* 1.2*  --   --  2.4* 3.3   < > = values in this interval not displayed.   GFR: Estimated Creatinine Clearance: 42.3 mL/min (A) (by C-G formula based on SCr of 2.17 mg/dL (H)). Recent Labs  Lab 11/16/19 1101  11/16/19 1346 11/16/19 1357 11/16/19 1740 11/16/19 2005 11/17/19 0500 11/18/19 0306  11/20/19 0448 11/21/19 0321 11/22/19 0408 11/23/19 0300  PROCALCITON  --   --   --  0.69  --   --  24.16 31.62  --   --   --   --   --   WBC  --    < >  --   --   --   --  11.1*  --    < > 8.1 9.3 11.3* 13.8*  LATICACIDVEN 5.9*  --  4.4*  --  1.2 1.3  --   --   --   --   --   --   --    < > = values in this interval not displayed.    Liver  Function Tests: Recent Labs  Lab 11/16/19 1116  11/21/19 0321 11/21/19 1550 11/22/19 0408 11/22/19 1652 11/23/19 0300  AST 30  --   --   --   --   --   --   ALT 17  --   --   --   --   --   --   ALKPHOS 98  --   --   --   --   --   --   BILITOT 1.2  --   --   --   --   --   --   PROT 7.7  --   --   --   --   --   --   ALBUMIN 3.5   < > 2.7* 2.7* 2.5* 2.3* 2.3*   < > = values in this interval not displayed.   No results for input(s): LIPASE, AMYLASE in the last 168 hours. No results for input(s): AMMONIA in the last 168 hours.  ABG    Component Value Date/Time   PHART 7.391 11/22/2019 0603   PCO2ART 38.3 11/22/2019 0603   PO2ART 87.0 11/22/2019 0603   HCO3 23.3 11/22/2019 0603   TCO2 24 11/22/2019 0603   ACIDBASEDEF 1.0 11/22/2019 0603   O2SAT 97.0 11/22/2019 0603     Coagulation Profile: Recent Labs  Lab 11/16/19 1238 11/16/19 2005  INR 1.5* 1.6*    Cardiac Enzymes: No results for input(s): CKTOTAL, CKMB, CKMBINDEX, TROPONINI in the last 168 hours.  HbA1C: Hgb A1c MFr Bld  Date/Time Value Ref Range Status  03/10/2011 12:02 PM (H) <5.7 % Final   5.7 (  NOTE)                                                                       According to the ADA Clinical Practice Recommendations for 2011, when HbA1c is used as a screening test:   >=6.5%   Diagnostic of Diabetes Mellitus           (if abnormal result  is confirmed)  5.7-6.4%   Increased risk of developing Diabetes Mellitus  References:Diagnosis and Classification of Diabetes Mellitus,Diabetes RWER,1540,08(QPYPP 1):S62-S69 and Standards of Medical Care in         Diabetes - 2011,Diabetes JKDT,2671,24  (Suppl 1):S11-S61.    CBG: Recent Labs  Lab 11/22/19 1623 11/22/19 2018 11/23/19 0015 11/23/19 0443 11/23/19 0908  GLUCAP 115* 129* 90 87 130*    The patient is critically ill with multiple organ systems failure and requires high complexity decision making for assessment and support, frequent evaluation and  titration of therapies, application of advanced monitoring technologies and extensive interpretation of multiple databases.   Critical Care Time devoted to patient care services described in this note is 48 minutes. This time reflects time of care of this [place] . This critical care time does not reflect separately billable procedures or procedure time, teaching time or supervisory time of PA/NP/Med student/Med Resident etc but could involve care discussion time.  [facility] 11/23/2019 9:18 AM  Pager: [phone] After hours pager: [phone]

## 2019-11-23 NOTE — Plan of Care (Signed)
  Problem: Neurologic: Goal: Promote progressive neurologic recovery Outcome: Progressing   Problem: Skin Integrity: Goal: Risk for impaired skin integrity will be minimized. Outcome: Progressing   Problem: Education: Goal: Knowledge of General Education information will improve Description: Including pain rating scale, medication(s)/side effects and non-pharmacologic comfort measures Outcome: Progressing   Problem: Health Behavior/Discharge Planning: Goal: Ability to manage health-related needs will improve Outcome: Progressing

## 2019-11-23 NOTE — Progress Notes (Signed)
ANTICOAGULATION CONSULT NOTE   Pharmacy Consult for Heparin Indication: atrial fibrillation and pulmonary embolus  Patient Measurements: Height: 5' 10.5" (179.1 cm) Weight: 164 lb 0.4 oz (74.4 kg) IBW/kg (Calculated) : 74.15  Vital Signs: Temp: 98.2 F (36.8 C) (12/09 0800) Temp Source: Rectal (12/09 0800) BP: 79/59 (12/09 0800) Pulse Rate: 62 (12/09 0800)  Labs: Recent Labs    11/21/19 0321  11/22/19 0408 11/22/19 0410 11/22/19 0425 11/22/19 0603 11/22/19 1652 11/23/19 0300 11/23/19 0445  HGB 10.7*  --  10.4*  --  12.2* 11.9*  --  10.8*  --   HCT 33.2*  --  31.2*  --  36.0* 35.0*  --  32.8*  --   PLT 113*  --  115*  --   --   --   --  129*  --   APTT  --   --   --   --   --   --   --   --  >200*  HEPARINUNFRC 0.46  --   --  0.36  --   --   --   --  0.55  CREATININE 1.66*   < > 1.70*  --   --   --  2.40* 2.17*  --    < > = values in this interval not displayed.    Estimated Creatinine Clearance: 42.3 mL/min (A) (by C-G formula based on SCr of 2.17 mg/dL (H)).  Assessment: 53 yo male s/p VT arrest and with VDRF and on CRRT. He is on heparin for with new onset afib and possible PE. He is noted with poor prognosis -heparin level at goal, hg= 10.8, plt= 129    Goal of Therapy:  Heparin level 0.3-0.7 units/ml  Monitor platelets by anticoagulation protocol: Yes   Plan:  Continue heparin at 1500 units/hr Monitor daily heparin level, CBC  Hildred Laser, PharmD Clinical Pharmacist **Pharmacist phone directory can now be found on [email or web address] ([email or web address]).  Listed under [place].

## 2019-11-23 NOTE — Progress Notes (Signed)
[place] Progress Note Patient Name: [name] [name] DOB: 1966-12-13 MRN: [id number]   Date of Service  11/23/2019  HPI/Events of Note  Vomiting, abdominal distention, faint bowel sounds r/o ileus  eICU Interventions  NG tube to LIS, KUB ordered, enteral feed held        [employer] 11/23/2019, 8:16 PM

## 2019-11-23 NOTE — Progress Notes (Signed)
Patient vomiting tube feeding. Abdomen distended. Left upper quadrant bowel sounds hypoactive. Other bowel sounds active. Patient had residual tube feeding totaling 350 mL. Tube feeding placed on hold. Elink/CCM called. Waiting for orders.

## 2019-11-23 NOTE — Progress Notes (Signed)
RT note- Placed back to PSV wean from trach collar trial, tolerated first trial well, will attempt again later this afternoon.

## 2019-11-23 NOTE — Progress Notes (Signed)
[place] Progress Note Patient Name: [name] [name] DOB: March 14, 1966 MRN: [id number]   Date of Service  11/23/2019  HPI/Events of Note  Patient with ventricular ectopy  eICU Interventions  BMET, Mg++ to be sent stat, Amiodarone 150 mg iv x 1 then continue infusion at 30 mg         Frederik Pear 11/23/2019, 2:54 AM

## 2019-11-23 NOTE — Progress Notes (Addendum)
[facility] ROUNDING NOTE   Subjective:   Is a 53 year old gentleman history of SVT systolic heart failure polysubstance abuse end-stage renal disease Monday Wednesday Friday dialysis [facility] kidney center status post PEA arrest 11/16/2019.  Occurred about 3-1/2 hours into hemodialysis treatment received 3 rounds of epinephrine 1 g of calcium 50 mEq of bicarbonate with ROSC of about 15 minutes.  Emergent cricothyroitomy performed by EMS due to failed intubation.  CRRT initiated 11/18/2019.   Daily Update:  seen in ICU, levo at 20 ug/min, diastolics very low, goal  map is now > 50. Having some PVC's overnight, got amio bolus.   I/O yest +600 cc, wt's down 1kg and CVP down to 13 from 16   Pt is responsive, nodding head to questions   Objective:  Vital signs in last 24 hours:  Temp:  [97.9 F (36.6 C)-101.1 F (38.4 C)] 98.2 F (36.8 C) (12/09 0900) Pulse Rate:  [38-70] 62 (12/09 0900) Resp:  [15-29] 15 (12/09 0900) BP: (76-103)/(43-79) 90/64 (12/09 0900) SpO2:  [95 %-100 %] 100 % (12/09 0900) Arterial Line BP: (90-130)/(35-68) 96/39 (12/09 0900) FiO2 (%):  [40 %-50 %] 40 % (12/09 0830) Weight:  [74.4 kg] 74.4 kg (12/09 0411)  Weight change: -0.9 kg Filed Weights   11/21/19 0600 11/22/19 0545 11/23/19 0411  Weight: 74.6 kg 75.3 kg 74.4 kg    Intake/Output: I/O last 3 completed shifts: In: 6079 [I.V.:4547.9; NG/GT:721.3; IV Piggyback:809.7] Out: 5007 [Emesis/NG output:20; WCHEN:2778]   Intake/Output this shift:  Total I/O In: 204.6 [I.V.:104.6; NG/GT:100] Out: 384 [Other:384]  Exam Cricothyrotomy, weaning vent on pressure support  responds to questions w/ head nods  LUE AVF +B/T Brady, nl s1s2 Coarse bs b/l S/mild distension Mild LE edema Temp cath in place  HD [facility]: [facility]  4h 24mn  74.5kg  2/2.5 bath  340/A1.5   P4  Hep 5000     C3 0.75 qTx     no ESA/Fe     Hb 12s usually  CXR 12/4 - bilat infiltrates / edema CXR 12/8 - no chg bilat  layering effusion and IS/ alveloar infiltrates   Assessment:   ES[facility]- MWF HD.  Started on CRRT 11/19/2019.  Remains in shock on pressors.   BP/Volume - down to dry wt, CXR still looks wet 12/8. cvp down a little, cont UF -50 /hr.  Will increase midodrine to 10 tid.  At OP HD pt's BP's are normal on coreg and Imdur home meds.  Status post arrest x 2- PEA at OP HD on 12/2, 1108m until ROSC.  ECHO LV = 20% ejection fraction. Seen by EP team. Then VT arrest on 12/8, recovered.   New onset atrial fibrillation CHA2DS2-VASc score 3 IV heparin.  Started oral amiodarone  Chronic combined congestive heart failure:  EF 25-30% , now 20% not a candidate for ICD due to noncompliance and drug abuse  Prolonged QT   Cardiogenic shock: remains on pressors  ANEMIA-no major issue  MBD-severe chronic hyperphosphatemia  ACCESS-AV fistula  Diffuse nonspecific encephalopathy EEG performed   Emergent cricothyroidotomy / acute respiratory failure- per CCM  History of polysubstance abuse  CT scan of chest with suspicion for pulmonary embolus - IV heparin  RoKelly SplinterMD 11/22/2019, 12:38 PM    Basic Metabolic Panel: Recent Labs  Lab 11/19/19 1453  11/20/19 0447  11/21/19 0321 11/21/19 1550 11/22/19 0408 11/22/19 0425 11/22/19 0603 11/22/19 1652 11/23/19 0300  NA 135   < > 135   < >  135 132* 135 135 135 131* 133*  K 4.3   < > 4.4   < > 4.2 4.2 4.3 5.0 4.0 4.6 4.8  CL 102  --  103   < > 101 101 101  --   --  100 100  CO2 23  --  20*   < > 22 22 22   --   --  20* 17*  GLUCOSE 112*  --  146*   < > 103* 106* 109*  --   --  130* 89  BUN 19  --  19   < > 14 14 15   --   --  24* 22*  CREATININE 3.03*  --  2.35*   < > 1.66* 1.72* 1.70*  --   --  2.40* 2.17*  CALCIUM 8.1*  --  8.4*   < > 8.3* 8.2* 8.3*  --   --  7.8* 7.8*  MG 2.3  --  2.5*  --  2.4  --  2.6*  --   --   --  2.5*  PHOS 2.2*  --  2.2*   < > 1.8* 1.6* 1.2*  --   --  2.4* 3.3   < > = values in this interval not displayed.    Liver  Function Tests: Recent Labs  Lab 11/16/19 1116  11/21/19 0321 11/21/19 1550 11/22/19 0408 11/22/19 1652 11/23/19 0300  AST 30  --   --   --   --   --   --   ALT 17  --   --   --   --   --   --   ALKPHOS 98  --   --   --   --   --   --   BILITOT 1.2  --   --   --   --   --   --   PROT 7.7  --   --   --   --   --   --   ALBUMIN 3.5   < > 2.7* 2.7* 2.5* 2.3* 2.3*   < > = values in this interval not displayed.   No results for input(s): LIPASE, AMYLASE in the last 168 hours. No results for input(s): AMMONIA in the last 168 hours.  CBC: Recent Labs  Lab 11/19/19 0402  11/20/19 0448  11/21/19 0321 11/22/19 0408 11/22/19 0425 11/22/19 0603 11/23/19 0300  WBC 7.0  --  8.1  --  9.3 11.3*  --   --  13.8*  NEUTROABS 6.1  --  7.7  --  7.8* 8.8*  --   --  9.6*  HGB 11.3*   < > 10.5*   < > 10.7* 10.4* 12.2* 11.9* 10.8*  HCT 35.5*   < > 32.6*   < > 33.2* 31.2* 36.0* 35.0* 32.8*  MCV 96.7  --  95.3  --  94.3 93.1  --   --  92.7  PLT 125*  --  110*  --  113* 115*  --   --  129*   < > = values in this interval not displayed.    Cardiac Enzymes: No results for input(s): CKTOTAL, CKMB, CKMBINDEX, TROPONINI in the last 168 hours.  BNP: Invalid input(s): POCBNP  CBG: Recent Labs  Lab 11/22/19 1623 11/22/19 2018 11/23/19 0015 11/23/19 0443 11/23/19 0908  GLUCAP 115* 129* 90 87 130*     Medications:   .  prismasol BGK 4/2.5 400 mL/hr at 11/23/19 0345  .  prismasol BGK 4/2.5 5,000 mL (11/22/19 1458)  . sodium chloride Stopped (11/17/19 0836)  . sodium chloride Stopped (11/22/19 1656)  . amiodarone 30 mg/hr (11/23/19 0258)  . cefTRIAXone (ROCEPHIN)  IV Stopped (11/22/19 1909)  . dextrose Stopped (11/22/19 1400)  . feeding supplement (VITAL 1.5 CAL) 50 mL/hr at 11/23/19 0900  . fentaNYL infusion INTRAVENOUS 150 mcg/hr (11/22/19 1832)  . heparin 1,500 Units/hr (11/23/19 0900)  . norepinephrine (LEVOPHED) Adult infusion 24 mcg/min (11/23/19 0900)  . prismasol BGK 4/2.5 1,600  mL/hr at 11/23/19 0729   . B-complex with vitamin C  1 tablet Per Tube Daily  . chlorhexidine gluconate (MEDLINE KIT)  15 mL Mouth Rinse BID  . Chlorhexidine Gluconate Cloth  6 each Topical Daily  . docusate  100 mg Per Tube Daily  . feeding supplement (PRO-STAT SUGAR FREE 64)  30 mL Per Tube TID  . fentaNYL (SUBLIMAZE) injection  100 mcg Intravenous Once  . mouth rinse  15 mL Mouth Rinse 10 times per day  . midodrine  10 mg Per Tube Q8H  . pantoprazole sodium  40 mg Per Tube QHS  . sodium chloride flush  10-40 mL Intracatheter Q12H  . white petrolatum   Topical Daily   sodium chloride, acetaminophen (TYLENOL) oral liquid 160 mg/5 mL, acetaminophen, alteplase, fentaNYL, heparin, midazolam, sodium chloride, sodium chloride flush

## 2019-11-23 NOTE — Progress Notes (Addendum)
Progress Note   Subjective   Remains intubated (trach) but more responsive.  Inpatient Medications    Scheduled Meds: . artificial tears  1 application Both Eyes T1X  . B-complex with vitamin C  1 tablet Per Tube Daily  . chlorhexidine gluconate (MEDLINE KIT)  15 mL Mouth Rinse BID  . Chlorhexidine Gluconate Cloth  6 each Topical Daily  . docusate  100 mg Per Tube Daily  . feeding supplement (PRO-STAT SUGAR FREE 64)  30 mL Per Tube TID  . fentaNYL (SUBLIMAZE) injection  100 mcg Intravenous Once  . mouth rinse  15 mL Mouth Rinse 10 times per day  . midodrine  5 mg Per Tube Q8H  . pantoprazole sodium  40 mg Per Tube QHS  . sodium chloride flush  10-40 mL Intracatheter Q12H  . white petrolatum   Topical Daily   Continuous Infusions: .  prismasol BGK 4/2.5 400 mL/hr at 11/23/19 0345  .  prismasol BGK 4/2.5 5,000 mL (11/22/19 1458)  . sodium chloride Stopped (11/17/19 0836)  . sodium chloride Stopped (11/22/19 1656)  . amiodarone 30 mg/hr (11/23/19 0258)  . cefTRIAXone (ROCEPHIN)  IV Stopped (11/22/19 1909)  . dextrose Stopped (11/22/19 1400)  . feeding supplement (VITAL 1.5 CAL) 1,000 mL (11/22/19 2206)  . fentaNYL infusion INTRAVENOUS 150 mcg/hr (11/22/19 1832)  . heparin 1,500 Units/hr (11/23/19 0700)  . norepinephrine (LEVOPHED) Adult infusion 20 mcg/min (11/23/19 0700)  . prismasol BGK 4/2.5 1,600 mL/hr at 11/23/19 0729   PRN Meds: sodium chloride, acetaminophen (TYLENOL) oral liquid 160 mg/5 mL, acetaminophen, alteplase, fentaNYL, heparin, midazolam, sodium chloride, sodium chloride flush   Vital Signs    Vitals:   11/23/19 0615 11/23/19 0630 11/23/19 0645 11/23/19 0700  BP: 96/76 92/66 (!) 89/66 (!) 79/51  Pulse:      Resp: (!) 28 17 (!) 24 (!) 28  Temp: 98.4 F (36.9 C) 98.4 F (36.9 C) 98.4 F (36.9 C) 98.4 F (36.9 C)  TempSrc:      SpO2:      Weight:      Height:        Intake/Output Summary (Last 24 hours) at 11/23/2019 0800 Last data filed at  11/23/2019 0700 Gross per 24 hour  Intake 4281.94 ml  Output 3939 ml  Net 342.94 ml   Filed Weights   11/21/19 0600 11/22/19 0545 11/23/19 0411  Weight: 74.6 kg 75.3 kg 74.4 kg    Telemetry    Sinus with frequent PVCs and atrial ectopy - Personally Reviewed  Physical Exam   GEN- The patient is ill appearing, sedated Head- normocephalic, atraumatic Eyes-  Sclera clear, conjunctiva pink Ears- hearing intact Oropharynx- clear Neck- supple, trach Lungs-  normal work of breathing Heart- Regular rate and rhythm with frequent ectopy GI- soft  Skin- no rash or lesion Psych- sedated Neuro- sedated   Labs    Chemistry Recent Labs  Lab 11/16/19 1116  11/22/19 0408  11/22/19 0603 11/22/19 1652 11/23/19 0300  NA 140   < > 135   < > 135 131* 133*  K 3.4*   < > 4.3   < > 4.0 4.6 4.8  CL 100   < > 101  --   --  100 100  CO2 23   < > 22  --   --  20* 17*  GLUCOSE 117*   < > 109*  --   --  130* 89  BUN 23*   < > 15  --   --  24* 22*  CREATININE 7.01*   < > 1.70*  --   --  2.40* 2.17*  CALCIUM 9.6   < > 8.3*  --   --  7.8* 7.8*  PROT 7.7  --   --   --   --   --   --   ALBUMIN 3.5   < > 2.5*  --   --  2.3* 2.3*  AST 30  --   --   --   --   --   --   ALT 17  --   --   --   --   --   --   ALKPHOS 98  --   --   --   --   --   --   BILITOT 1.2  --   --   --   --   --   --   GFRNONAA 8*   < > 46*  --   --  30* 34*  GFRAA 10*   < > 53*  --   --  35* 39*  ANIONGAP 17*   < > 12  --   --  11 16*   < > = values in this interval not displayed.     Hematology Recent Labs  Lab 11/21/19 0321 11/22/19 0408 11/22/19 0425 11/22/19 0603 11/23/19 0300  WBC 9.3 11.3*  --   --  13.8*  RBC 3.52* 3.35*  --   --  3.54*  HGB 10.7* 10.4* 12.2* 11.9* 10.8*  HCT 33.2* 31.2* 36.0* 35.0* 32.8*  MCV 94.3 93.1  --   --  92.7  MCH 30.4 31.0  --   --  30.5  MCHC 32.2 33.3  --   --  32.9  RDW 16.3* 16.3*  --   --  16.8*  PLT 113* 115*  --   --  129*     Patient Profile:   Kiril Hippe is  a 53 y.o. male with a hx of chronic CHF (combined), ESRF onHD, poly substance abuse (ETOH, cocaine), h/o VT in setting of cocaine, HTN urgency (2011), lost to cardiology follow up since 2018 who is being seen today for the evaluation of VT arrest at the request of Dr. Shearon Stalls.  Assessment & Plan    1.  VT No further VT though he does have frequent ectopy BP too low for beta blocker therapy Continue IV amiodarone Will need to have long discussions if he recovers/ makes meaningful improvement as currently he would not be an ICD candidate Keep Mg >1.8, K >3.9  2. Nonischemic CM Volume managed with dialysis BP too low for medical therapy current  3. Paroxysmal atrial fibrillation On amiodarone On heparin drip  Prognosis is poor. Goals of care conversations would be beneficial  Thompson Grayer MD, [facility] 11/23/2019 8:00 AM

## 2019-11-24 ENCOUNTER — Inpatient Hospital Stay (HOSPITAL_COMMUNITY): Payer: Medicare Other

## 2019-11-24 ENCOUNTER — Encounter (HOSPITAL_COMMUNITY)
Admission: EM | Disposition: A | Discharge status: Nursing Home (Includes ICF) | Payer: Self-pay | Attending: Internal Medicine

## 2019-11-24 ENCOUNTER — Encounter (HOSPITAL_COMMUNITY): Payer: Self-pay | Admitting: Pulmonary Disease

## 2019-11-24 DIAGNOSIS — J9601 Acute respiratory failure with hypoxia: Secondary | ICD-10-CM | POA: Diagnosis not present

## 2019-11-24 DIAGNOSIS — I469 Cardiac arrest, cause unspecified: Secondary | ICD-10-CM | POA: Diagnosis not present

## 2019-11-24 HISTORY — PX: TRACHEOSTOMY TUBE PLACEMENT: SHX814

## 2019-11-24 LAB — CBC WITH DIFFERENTIAL/PLATELET
Abs Immature Granulocytes: 0.93 10*3/uL — ABNORMAL HIGH (ref 0.00–0.07)
Basophils Absolute: 0.1 10*3/uL (ref 0.0–0.1)
Basophils Relative: 1 %
Eosinophils Absolute: 0.1 10*3/uL (ref 0.0–0.5)
Eosinophils Relative: 1 %
HCT: 32.7 % — ABNORMAL LOW (ref 39.0–52.0)
Hemoglobin: 11.2 g/dL — ABNORMAL LOW (ref 13.0–17.0)
Immature Granulocytes: 5 %
Lymphocytes Relative: 6 %
Lymphs Abs: 1.1 10*3/uL (ref 0.7–4.0)
MCH: 30 pg (ref 26.0–34.0)
MCHC: 34.3 g/dL (ref 30.0–36.0)
MCV: 87.7 fL (ref 80.0–100.0)
Monocytes Absolute: 1.7 10*3/uL — ABNORMAL HIGH (ref 0.1–1.0)
Monocytes Relative: 9 %
Neutro Abs: 14.9 10*3/uL — ABNORMAL HIGH (ref 1.7–7.7)
Neutrophils Relative %: 78 %
Platelets: 164 10*3/uL (ref 150–400)
RBC: 3.73 MIL/uL — ABNORMAL LOW (ref 4.22–5.81)
RDW: 16.4 % — ABNORMAL HIGH (ref 11.5–15.5)
WBC: 19 10*3/uL — ABNORMAL HIGH (ref 4.0–10.5)
nRBC: 6.8 % — ABNORMAL HIGH (ref 0.0–0.2)

## 2019-11-24 LAB — GLUCOSE, CAPILLARY
Glucose-Capillary: 111 mg/dL — ABNORMAL HIGH (ref 70–99)
Glucose-Capillary: 115 mg/dL — ABNORMAL HIGH (ref 70–99)
Glucose-Capillary: 162 mg/dL — ABNORMAL HIGH (ref 70–99)
Glucose-Capillary: 50 mg/dL — ABNORMAL LOW (ref 70–99)
Glucose-Capillary: 58 mg/dL — ABNORMAL LOW (ref 70–99)
Glucose-Capillary: 80 mg/dL (ref 70–99)
Glucose-Capillary: 81 mg/dL (ref 70–99)
Glucose-Capillary: 88 mg/dL (ref 70–99)

## 2019-11-24 LAB — SURGICAL PCR SCREEN
MRSA, PCR: NEGATIVE
Staphylococcus aureus: POSITIVE — AB

## 2019-11-24 LAB — RENAL FUNCTION PANEL
Albumin: 2.3 g/dL — ABNORMAL LOW (ref 3.5–5.0)
Albumin: 2.1 g/dL — ABNORMAL LOW (ref 3.5–5.0)
Anion gap: 12 (ref 5–15)
Anion gap: 14 (ref 5–15)
BUN: 25 mg/dL — ABNORMAL HIGH (ref 6–20)
BUN: 23 mg/dL — ABNORMAL HIGH (ref 6–20)
CO2: 21 mmol/L — ABNORMAL LOW (ref 22–32)
CO2: 20 mmol/L — ABNORMAL LOW (ref 22–32)
Calcium: 7.9 mg/dL — ABNORMAL LOW (ref 8.9–10.3)
Calcium: 7.7 mg/dL — ABNORMAL LOW (ref 8.9–10.3)
Chloride: 102 mmol/L (ref 98–111)
Chloride: 102 mmol/L (ref 98–111)
Creatinine, Ser: 1.86 mg/dL — ABNORMAL HIGH (ref 0.61–1.24)
Creatinine, Ser: 1.84 mg/dL — ABNORMAL HIGH (ref 0.61–1.24)
GFR calc Af Amer: 47 mL/min — ABNORMAL LOW (ref >60)
GFR calc Af Amer: 48 mL/min — ABNORMAL LOW (ref >60)
GFR calc non Af Amer: 41 mL/min — ABNORMAL LOW (ref >60)
GFR calc non Af Amer: 42 mL/min — ABNORMAL LOW (ref >60)
Glucose, Bld: 116 mg/dL — ABNORMAL HIGH (ref 70–99)
Glucose, Bld: 106 mg/dL — ABNORMAL HIGH (ref 70–99)
Phosphorus: 1.5 mg/dL — ABNORMAL LOW (ref 2.5–4.6)
Phosphorus: 1.8 mg/dL — ABNORMAL LOW (ref 2.5–4.6)
Potassium: 4.3 mmol/L (ref 3.5–5.1)
Potassium: 4.2 mmol/L (ref 3.5–5.1)
Sodium: 135 mmol/L (ref 135–145)
Sodium: 136 mmol/L (ref 135–145)

## 2019-11-24 LAB — HEPARIN LEVEL (UNFRACTIONATED): Heparin Unfractionated: 0.47 IU/mL (ref 0.30–0.70)

## 2019-11-24 LAB — APTT
aPTT: >200 seconds (ref 24–36)
aPTT: 171 seconds (ref 24–36)

## 2019-11-24 LAB — MAGNESIUM: Magnesium: 2.5 mg/dL — ABNORMAL HIGH (ref 1.7–2.4)

## 2019-11-24 SURGERY — CREATION, TRACHEOSTOMY
Anesthesia: General

## 2019-11-24 MED ORDER — SODIUM CHLORIDE 0.9 % IR SOLN
Status: DC | PRN
  Administered 2019-11-24: 1000 mL

## 2019-11-24 MED ORDER — STERILE WATER FOR IRRIGATION IR SOLN
Status: DC | PRN
  Administered 2019-11-24: 1000 mL

## 2019-11-24 MED ORDER — DEXTROSE 50 % IV SOLN: 25.0000 g | INTRAVENOUS | Status: AC

## 2019-11-24 MED ORDER — ROCURONIUM BROMIDE 10 MG/ML (PF) SYRINGE: PREFILLED_SYRINGE | INTRAVENOUS | Status: DC | PRN

## 2019-11-24 MED ORDER — FENTANYL CITRATE (PF) 250 MCG/5ML IJ SOLN
INTRAMUSCULAR | Status: DC | PRN
  Administered 2019-11-24: 12.5 ug via INTRAVENOUS

## 2019-11-24 MED ORDER — MIDAZOLAM HCL 2 MG/2ML IJ SOLN
INTRAMUSCULAR | Status: AC
  Filled 2019-11-24: qty 2

## 2019-11-24 MED ORDER — PROPOFOL 1000 MG/100ML IV EMUL
INTRAVENOUS | Status: AC
  Filled 2019-11-24: qty 100

## 2019-11-24 MED ORDER — ROCURONIUM BROMIDE 10 MG/ML (PF) SYRINGE
PREFILLED_SYRINGE | INTRAVENOUS | Status: DC | PRN
  Administered 2019-11-24: 30 mg via INTRAVENOUS
  Administered 2019-11-24: 20 mg via INTRAVENOUS

## 2019-11-24 MED ORDER — FENTANYL CITRATE (PF) 250 MCG/5ML IJ SOLN
INTRAMUSCULAR | Status: AC
  Filled 2019-11-24: qty 5

## 2019-11-24 MED ORDER — SODIUM PHOSPHATES 45 MMOLE/15ML IV SOLN
30.0000 mmol | Freq: Once | INTRAVENOUS | Status: AC
  Administered 2019-11-24: 30 mmol via INTRAVENOUS
  Filled 2019-11-24: qty 10

## 2019-11-24 MED ORDER — PROPOFOL 10 MG/ML IV BOLUS
INTRAVENOUS | Status: AC
  Filled 2019-11-24: qty 20

## 2019-11-24 MED ORDER — DEXTROSE 50 % IV SOLN
INTRAVENOUS | Status: AC
  Administered 2019-11-24: 25 g via INTRAVENOUS
  Filled 2019-11-24: qty 50

## 2019-11-24 SURGICAL SUPPLY — 37 items
BENZOIN TINCTURE PRP APPL 2/3 (GAUZE/BANDAGES/DRESSINGS) IMPLANT
BLADE CLIPPER SURG (BLADE) IMPLANT
CANISTER SUCT 3000ML PPV (MISCELLANEOUS) ×2 IMPLANT
CLEANER TIP ELECTROSURG 2X2 (MISCELLANEOUS) ×2 IMPLANT
COVER SURGICAL LIGHT HANDLE (MISCELLANEOUS) ×2 IMPLANT
COVER WAND RF STERILE (DRAPES) ×1 IMPLANT
DECANTER SPIKE VIAL GLASS SM (MISCELLANEOUS) ×1 IMPLANT
DRAPE HALF SHEET 40X57 (DRAPES) IMPLANT
ELECT COATED BLADE 2.86 ST (ELECTRODE) ×2 IMPLANT
ELECT REM PT RETURN 9FT ADLT (ELECTROSURGICAL) ×2
ELECTRODE REM PT RTRN 9FT ADLT (ELECTROSURGICAL) ×1 IMPLANT
GAUZE 4X4 16PLY RFD (DISPOSABLE) ×2 IMPLANT
GAUZE SPONGE 4X4 12PLY STRL (GAUZE/BANDAGES/DRESSINGS) ×1 IMPLANT
GLOVE ECLIPSE 7.5 STRL STRAW (GLOVE) ×2 IMPLANT
GOWN STRL REUS W/ TWL LRG LVL3 (GOWN DISPOSABLE) ×2 IMPLANT
GOWN STRL REUS W/TWL LRG LVL3 (GOWN DISPOSABLE) ×2
KIT BASIN OR (CUSTOM PROCEDURE TRAY) ×2 IMPLANT
KIT TURNOVER KIT B (KITS) ×2 IMPLANT
NDL PRECISIONGLIDE 27X1.5 (NEEDLE) ×1 IMPLANT
NEEDLE PRECISIONGLIDE 27X1.5 (NEEDLE) ×2 IMPLANT
NS IRRIG 1000ML POUR BTL (IV SOLUTION) ×2 IMPLANT
PAD ARMBOARD 7.5X6 YLW CONV (MISCELLANEOUS) ×4 IMPLANT
PENCIL FOOT CONTROL (ELECTRODE) ×2 IMPLANT
SPLINT NASAL DOYLE BI-VL (GAUZE/BANDAGES/DRESSINGS) ×1 IMPLANT
SUT CHROMIC 2 0 SH (SUTURE) ×2 IMPLANT
SUT ETHILON 3 0 PS 1 (SUTURE) ×2 IMPLANT
SUT SILK 4 0 (SUTURE) ×1
SUT SILK 4 0 TIE 10X30 (SUTURE) ×2 IMPLANT
SUT SILK 4-0 18XBRD TIE 12 (SUTURE) ×1 IMPLANT
SYR 20ML LL LF (SYRINGE) ×2 IMPLANT
SYR CONTROL 10ML LL (SYRINGE) IMPLANT
TOWEL GREEN STERILE FF (TOWEL DISPOSABLE) ×2 IMPLANT
TRAY ENT MC OR (CUSTOM PROCEDURE TRAY) ×2 IMPLANT
TUBE CONNECTING 12X1/4 (SUCTIONS) ×2 IMPLANT
TUBE TRACH SHILEY  6 DIST  CUF (TUBING) ×1 IMPLANT
WATER STERILE IRR 1000ML POUR (IV SOLUTION) ×2 IMPLANT
YANKAUER SUCT BULB TIP NO VENT (SUCTIONS) ×1 IMPLANT

## 2019-11-24 NOTE — Transfer of Care (Signed)
Immediate Anesthesia Transfer of Care Note  Patient: [name] [name]  Procedure(s) Performed: TRACHEOSTOMY (N/A )  Patient Location: ICU  Anesthesia Type:General  Level of Consciousness: sedated  Airway & Oxygen Therapy: Patient placed on Ventilator (see vital sign flow sheet for setting)  Post-op Assessment: Report given to RN and Post -op Vital signs reviewed and stable  Post vital signs: Reviewed and stable  Last Vitals:  Vitals Value Taken Time  BP    Temp    Pulse    Resp    SpO2      Last Pain:  Vitals:   11/24/19 0857  TempSrc: Oral  PainSc:          Complications: No apparent anesthesia complications

## 2019-11-24 NOTE — Anesthesia Postprocedure Evaluation (Signed)
Anesthesia Post Note  Patient: Nicandro Perrault  Procedure(s) Performed: TRACHEOSTOMY (N/A )     Patient location during evaluation: PACU Anesthesia Type: General Level of consciousness: awake and alert Pain management: pain level controlled Vital Signs Assessment: post-procedure vital signs reviewed and stable Respiratory status: spontaneous breathing, nonlabored ventilation, respiratory function stable and patient connected to nasal cannula oxygen Cardiovascular status: blood pressure returned to baseline and stable Postop Assessment: no apparent nausea or vomiting Anesthetic complications: no    Last Vitals:  Vitals:   11/24/19 0900 11/24/19 1141  BP: (!) 81/51 (!) 93/44  Pulse: (!) 56 (!) 56  Resp: 17 19  Temp:    SpO2: 100% 98%    Last Pain:  Vitals:   11/24/19 0857  TempSrc: Oral  PainSc:                  Effie Berkshire

## 2019-11-24 NOTE — Anesthesia Preprocedure Evaluation (Addendum)
Anesthesia Evaluation  Patient identified by MRN, date of birth, ID band Patient awake    Reviewed: Allergy & Precautions, NPO status , Patient's Chart, lab work & pertinent test results  Airway Mallampati: [place]  (+) Teeth Intact   Pulmonary former smoker,     + decreased breath sounds      Cardiovascular hypertension, Pt. on home beta blockers +CHF  Normal cardiovascular exam+ dysrhythmias Supra Ventricular Tachycardia      Neuro/Psych PSYCHIATRIC DISORDERS negative neurological ROS     GI/Hepatic negative GI ROS, Neg liver ROS,   Endo/Other  negative endocrine ROS  Renal/GU ESRF and DialysisRenal disease     Musculoskeletal negative musculoskeletal ROS (+)   Abdominal Normal abdominal exam  (+)   Peds  Hematology negative hematology ROS (+)   Anesthesia Other Findings   Reproductive/Obstetrics                            Anesthesia Physical Anesthesia Plan  ASA: IV  Anesthesia Plan: General   Post-op Pain Management:    Induction: Inhalational  PONV Risk Score and Plan: 3 and Ondansetron, Midazolam and Treatment may vary due to age or medical condition  Airway Management Planned: Oral ETT  Additional Equipment: None  Intra-op Plan:   Post-operative Plan: Post-operative intubation/ventilation  Informed Consent:   Plan Discussed with: CRNA  Anesthesia Plan Comments:         Anesthesia Quick Evaluation

## 2019-11-24 NOTE — Progress Notes (Signed)
ANTICOAGULATION CONSULT NOTE   Pharmacy Consult for Heparin Indication: atrial fibrillation and pulmonary embolus  Patient Measurements: Height: 5' 10.5" (179.1 cm) Weight: 156 lb 1.4 oz (70.8 kg) IBW/kg (Calculated) : 74.15  Vital Signs: Temp: 97.5 F (36.4 C) (12/10 0300) Temp Source: Oral (12/10 0300) BP: 96/49 (12/10 0818) Pulse Rate: 57 (12/10 0818)  Labs: Recent Labs    11/22/19 0408 11/22/19 0410 11/22/19 0425 11/22/19 0603 11/23/19 0300 11/23/19 0445 11/23/19 1514 11/24/19 0324  HGB 10.4*  --   --  11.9* 10.8*  --   --  11.2*  HCT 31.2*  --   --  35.0* 32.8*  --   --  32.7*  PLT 115*  --   --   --  129*  --   --  164  APTT  --   --   --   --   --  >200*  --  171*  HEPARINUNFRC  --  0.36  --   --   --  0.55  --  0.47  CREATININE 1.70*  --    < >  --  2.17*  --  1.93* 1.86*   < > = values in this interval not displayed.    Estimated Creatinine Clearance: 47.1 mL/min (A) (by C-G formula based on SCr of 1.86 mg/dL (H)).  Assessment: 53 yo male s/p VT arrest and with VDRF and on CRRT. He is on heparin for with new onset afib and possible PE. He is noted with poor prognosis -heparin level at goal, hg= 11.2, plt= 164    Goal of Therapy:  Heparin level 0.3-0.7 units/ml  Monitor platelets by anticoagulation protocol: Yes   Plan:  Continue heparin at 1500 units/hr Monitor daily heparin level, CBC  Hildred Laser, PharmD Clinical Pharmacist **Pharmacist phone directory can now be found on [email or web address] ([email or web address]).  Listed under [place].

## 2019-11-24 NOTE — Progress Notes (Signed)
OG tube connected to low-intermittent suction. 300 mL more tube feeding was in cannister.

## 2019-11-24 NOTE — Interval H&P Note (Signed)
History and Physical Interval Note:  11/24/2019 8:28 AM  [name] [name]  has presented today for surgery, with the diagnosis of tracheostomy.  The various methods of treatment have been discussed with the patient and family. After consideration of risks, benefits and other options for treatment, the patient has consented to  Procedure(s): TRACHEOSTOMY (N/A) as a surgical intervention.  The patient's history has been reviewed, patient examined, no change in status, stable for surgery.  I have reviewed the patient's chart and labs.  Questions were answered to the patient's satisfaction.     [name] [name]

## 2019-11-24 NOTE — Op Note (Signed)
11/24/2019 10:06 AM   PATIENT:  [name] [name], 53 y.o. male  PRE-OPERATIVE DIAGNOSIS: Ventilator dependent, previous cricothyrotomy  POST-OPERATIVE DIAGNOSIS: Same  PROCEDURE:  Procedure(s): TRACHEOSTOMY conversion from cricothyrotomy  SURGEON:  Surgeon(s): Beckie Salts, MD  ASSISTANTS: none   ANESTHESIA:   general  EBL: Minimal   DRAINS: none   LOCAL MEDICATIONS USED:  NONE  COUNTS CORRECT:  YES  PROCEDURE DETAILS: Patient was taken to the operating room and placed on the operating table in the supine position. A shoulder roll was placed for positioning. The patient was previously orally intubated. The neck was prepped and draped in a standard fashion. A vertical incision was created just above the sternal notch and just below the previous incision using electrocautery. The midline fascia was divided. The isthmus of the thyroid was reflected superiorly and the upper trachea was exposed. A tracheotomy was created between the second and third tracheal rings in a horizontal fashion. A lower tracheal flap was created with scissors and the flap was sutured to the cervical skin using 2-0 chromic suture. The previous tube was removed. The # 6 Shiley tracheostomy tube was placed without difficulty and the cuff was inflated. The shield was secured to the neck using a Velcro straps and nylon suture.  The previous incision was reapproximated with 3-0 chromic mattress sutures.  The patient was then transferred back to the intensive care unit in critical condition.  PLAN OF CARE: Transfer to ICU  PATIENT DISPOSITION:  ICU - hemodynamically stable.

## 2019-11-24 NOTE — Progress Notes (Signed)
NAME:  [name] [name], MRN:  [id number], DOB:  02-20-1966, LOS: 77 ADMISSION DATE:  11/16/2019, CONSULTATION DATE:  12/2 REFERRING MD:  Dr. Sedonia Small, CHIEF COMPLAINT:  Post cardiac arrest    Brief History   53yo male presented post PEA arrest which occurred during iHD treatment as outpatient. This was a witnessed arrest with quick bystander CPR. Received 3 rounds of epi, 1g of calcium and 50 mEq of bicarb with ROSC after 15 minutes.   Emergent cricothyrotomy performed by EMS due to failed intubation with [facility] airway.   Past Medical History  NSVT 2017 Nonischemic cardiomyopathy Medical noncompliance Polysubstance abuse Hypertension End-stage renal disease Combined systolic and diastolic congestive heart failure Cocaine abuse Alcohol abuse Prolonged QTC  Significant Hospital Events   12/2 admitted post cardiac arrest, emergent cric, 33 C TTM 12/8-V. tach arrest 12/10 Cric changed to tracheostomy  Consults:  Nephrology Electrophysiology ENT  Procedures:  12/2 remeasuring cricothyrotomy per EMS  Significant Diagnostic Tests:  CTA 12/2-segmental filling defects in the right middle lobe and right lower lobe pulmonary arteries, pneumomediastinum, subcu emphysema CT head 12/8-no acute abnormality  Echocardiogram 11/17/19-LVEF less than 20%, normal LV systolic function  Micro Data:  COVID 12/2 > negative Aspirate 12/04+ for Haemophilus influenza all/2 blood cultures no growth to date  Antimicrobials:  Unasyn 12/4>>12/7 Ceftriazone 12/7>>  Interim history/subjective:  Underwent tracheostomy.  Continues on Levophed  Objective   Blood pressure (!) 96/49, pulse (!) 57, temperature (!) 97.5 F (36.4 C), temperature source Oral, resp. rate 20, height 5' 10.5" (1.791 m), weight 70.8 kg, SpO2 100 %. CVP:  [15 mmHg-24 mmHg] 15 mmHg  Vent Mode: PRVC FiO2 (%):  [40 %] 40 % Set Rate:  [28 bmp] 28 bmp Vt Set:  [440 mL] 440 mL PEEP:  [5 cmH20] 5 cmH20 Pressure Support:  [12 cmH20-14  cmH20] 14 cmH20 Plateau Pressure:  [19 cmH20-20 cmH20] 19 cmH20   Intake/Output Summary (Last 24 hours) at 11/24/2019 1044 Last data filed at 11/24/2019 1004 Gross per 24 hour  Intake 2255.29 ml  Output 3661 ml  Net -1405.71 ml   Filed Weights   11/22/19 0545 11/23/19 0411 11/24/19 0500  Weight: 75.3 kg 74.4 kg 70.8 kg    Examination: Gen:      No acute distress HEENT:  EOMI, sclera anicteric Neck:     No masses; no thyromegaly, trach in place Lungs:    Clear to auscultation bilaterally; normal respiratory effort CV:         Regular rate and rhythm; no murmurs Abd:      + bowel sounds; soft, non-tender; no palpable masses, no distension Ext:    No edema; adequate peripheral perfusion Skin:      Warm and dry; no rash Neuro: Sedated, unresponsive Lines: Right femoral HD catheter, left femoral arterial line, right subclavian central venous catheter  Resolved Hospital Problem list     Assessment & Plan:  Cardiac arrest, with tachycardia Possibly PE but filling defects may be artifactual. Combined systolic and diastolic CHF, NSVT, prolonged QT, and polysubstance abuse  Cardiogenic shock, LVEF less than 20% P: S/p TTM goal 33 degrees Wean down nor epi as tolerated  Acute hypoxic respiratory failure Emergent cricothyrotomy   -In the setting of post cardiac arrest  P: Status post tracheostomy We will start pressure support weans when he wakes up  New onset A-fib HX NSVT/V. fib arrest HX Prolonged QT Chronic combined systolic and diastolic congestive heart failure P: Cardiology on board Continue amiodarone, heparin drip  Nonischemic cardiomyopathy  -ECHO 11/10/18 with EF of 25-30% and grade 2 diastolicdysfunction  -Right and left heart cath 02/25/2016 with no angiographic evidence of CAD P: Volume removal with dialysis  Acute encephalopathy Has been responsive per nursing We will wean off Precedex and use intermittent fentanyl, and Versed  ESRD -Undergoing  iHD at time of arrest, assuming regular schedule is MWF P: Continue CRRT  Polysubstance abuse -Hx of cocaine, alcohol, and tobacco abuse P: PAD protocol   Best practice:  Diet: tube feeds at goal Pain/Anxiety/Delirium protocol (if indicated): fentanyl infusion VAP protocol (if indicated): yes, ordered DVT prophylaxis: heparin gtt GI prophylaxis: PPI Glucose control: yes less than 180 Foley: none Mobility: bed rest Code Status: Full Family Communication: Family update pending Disposition: needs ICU  Labs   Significant for glucose 116, BUN/creatinine 25/1.86, Phos 1.5 WBC count 19, hemoglobin 11.2, platelets 164   Critical care time:    Critical Care Time devoted to patient care services described in this note is 48 minutes. This time reflects time of care of this [name] [name] . This critical care time does not reflect separately billable procedures or procedure time, teaching time or supervisory time of PA/NP/Med student/Med Resident etc but could involve care discussion time.  [name] [name] [facility] 11/24/2019 10:44 AM  Pager: [phone] After hours pager: [phone]

## 2019-11-24 NOTE — Progress Notes (Signed)
Telemetry reviewed SB/SR, 1st degree AVblock, some accelerated junctional as well, rates generally 50's One NSVT 6 beats, occ PVCs  Had vomiting, abd distention, OG tube to suction Went for trach today  Continue amiodarone gtt  Continue care with primary team We will follow along  Tommye Standard, PA-C

## 2019-11-25 ENCOUNTER — Inpatient Hospital Stay (HOSPITAL_COMMUNITY): Payer: Medicare Other

## 2019-11-25 LAB — CBC WITH DIFFERENTIAL/PLATELET
Abs Immature Granulocytes: 1.18 10*3/uL — ABNORMAL HIGH (ref 0.00–0.07)
Basophils Absolute: 0.1 10*3/uL (ref 0.0–0.1)
Basophils Relative: 1 %
Eosinophils Absolute: 0.1 10*3/uL (ref 0.0–0.5)
Eosinophils Relative: 1 %
HCT: 32.7 % — ABNORMAL LOW (ref 39.0–52.0)
Hemoglobin: 11.3 g/dL — ABNORMAL LOW (ref 13.0–17.0)
Immature Granulocytes: 5 %
Lymphocytes Relative: 6 %
Lymphs Abs: 1.3 10*3/uL (ref 0.7–4.0)
MCH: 29.8 pg (ref 26.0–34.0)
MCHC: 34.6 g/dL (ref 30.0–36.0)
MCV: 86.3 fL (ref 80.0–100.0)
Monocytes Absolute: 1.7 10*3/uL — ABNORMAL HIGH (ref 0.1–1.0)
Monocytes Relative: 8 %
Neutro Abs: 18 10*3/uL — ABNORMAL HIGH (ref 1.7–7.7)
Neutrophils Relative %: 79 %
Platelets: 189 10*3/uL (ref 150–400)
RBC: 3.79 MIL/uL — ABNORMAL LOW (ref 4.22–5.81)
RDW: 16.8 % — ABNORMAL HIGH (ref 11.5–15.5)
WBC: 22.5 10*3/uL — ABNORMAL HIGH (ref 4.0–10.5)
nRBC: 4.8 % — ABNORMAL HIGH (ref 0.0–0.2)

## 2019-11-25 LAB — POCT I-STAT 7, (LYTES, BLD GAS, ICA,H+H)
Bicarbonate: 24.6 mmol/L (ref 20.0–28.0)
Calcium, Ion: 1.03 mmol/L — ABNORMAL LOW (ref 1.15–1.40)
HCT: 38 % — ABNORMAL LOW (ref 39.0–52.0)
Hemoglobin: 12.9 g/dL — ABNORMAL LOW (ref 13.0–17.0)
O2 Saturation: 98 %
Patient temperature: 36.4
Potassium: 4 mmol/L (ref 3.5–5.1)
Sodium: 135 mmol/L (ref 135–145)
TCO2: 26 mmol/L (ref 22–32)
pCO2 arterial: 36.9 mmHg (ref 32.0–48.0)
pH, Arterial: 7.43 (ref 7.350–7.450)
pO2, Arterial: 106 mmHg (ref 83.0–108.0)

## 2019-11-25 LAB — RENAL FUNCTION PANEL
Albumin: 2.3 g/dL — ABNORMAL LOW (ref 3.5–5.0)
Anion gap: 15 (ref 5–15)
BUN: 21 mg/dL — ABNORMAL HIGH (ref 6–20)
CO2: 21 mmol/L — ABNORMAL LOW (ref 22–32)
Calcium: 8 mg/dL — ABNORMAL LOW (ref 8.9–10.3)
Chloride: 101 mmol/L (ref 98–111)
Creatinine, Ser: 1.71 mg/dL — ABNORMAL HIGH (ref 0.61–1.24)
GFR calc Af Amer: 53 mL/min — ABNORMAL LOW (ref >60)
GFR calc non Af Amer: 45 mL/min — ABNORMAL LOW (ref >60)
Glucose, Bld: 117 mg/dL — ABNORMAL HIGH (ref 70–99)
Phosphorus: 3 mg/dL (ref 2.5–4.6)
Potassium: 4.1 mmol/L (ref 3.5–5.1)
Sodium: 137 mmol/L (ref 135–145)

## 2019-11-25 LAB — GLUCOSE, CAPILLARY
Glucose-Capillary: 107 mg/dL — ABNORMAL HIGH (ref 70–99)
Glucose-Capillary: 109 mg/dL — ABNORMAL HIGH (ref 70–99)
Glucose-Capillary: 110 mg/dL — ABNORMAL HIGH (ref 70–99)
Glucose-Capillary: 129 mg/dL — ABNORMAL HIGH (ref 70–99)
Glucose-Capillary: 131 mg/dL — ABNORMAL HIGH (ref 70–99)
Glucose-Capillary: 161 mg/dL — ABNORMAL HIGH (ref 70–99)
Glucose-Capillary: 57 mg/dL — ABNORMAL LOW (ref 70–99)
Glucose-Capillary: 82 mg/dL (ref 70–99)

## 2019-11-25 LAB — APTT: aPTT: 133 seconds — ABNORMAL HIGH (ref 24–36)

## 2019-11-25 LAB — HEPARIN LEVEL (UNFRACTIONATED): Heparin Unfractionated: 0.35 IU/mL (ref 0.30–0.70)

## 2019-11-25 LAB — MAGNESIUM: Magnesium: 2.5 mg/dL — ABNORMAL HIGH (ref 1.7–2.4)

## 2019-11-25 LAB — PATHOLOGIST SMEAR REVIEW

## 2019-11-25 MED ORDER — CALCIUM GLUCONATE-NACL 1-0.675 GM/50ML-% IV SOLN
1.0000 g | Freq: Once | INTRAVENOUS | Status: AC
  Administered 2019-11-25: 1000 mg via INTRAVENOUS
  Filled 2019-11-25: qty 50

## 2019-11-25 MED ORDER — PROCHLORPERAZINE EDISYLATE 10 MG/2ML IJ SOLN
5.0000 mg | INTRAMUSCULAR | Status: DC | PRN
  Administered 2019-11-25 – 2019-11-27 (×7): 5 mg via INTRAVENOUS
  Filled 2019-11-25 (×10): qty 1

## 2019-11-25 MED ORDER — DEXTROSE 50 % IV SOLN
12.5000 g | INTRAVENOUS | Status: AC
  Administered 2019-11-25: 12.5 g via INTRAVENOUS
  Filled 2019-11-25: qty 50

## 2019-11-25 NOTE — Progress Notes (Signed)
[facility] Progress Note Patient Name: [name] [name] DOB: August 07, 1966 MRN: [id number]   Date of Service  11/25/2019  HPI/Events of Note  vomiting  eICU Interventions  KUB r/o ileus        Frederik Pear 11/25/2019, 6:39 AM

## 2019-11-25 NOTE — Progress Notes (Addendum)
NAME:  [name] [name], MRN:  [id number], DOB:  [birth year], LOS: 98 ADMISSION DATE:  11/16/2019, CONSULTATION DATE:  12/2 REFERRING MD:  Dr. Sedonia Small, CHIEF COMPLAINT:  Post cardiac arrest    Brief History   53yo male presented post PEA arrest which occurred during iHD treatment as outpatient. This was a witnessed arrest with quick bystander CPR. Received 3 rounds of epi, 1g of calcium and 50 mEq of bicarb with ROSC after 15 minutes.   Emergent cricothyrotomy performed by EMS due to failed intubation with [facility] airway.   Past Medical History  NSVT 2017 Nonischemic cardiomyopathy Medical noncompliance Polysubstance abuse Hypertension End-stage renal disease Combined systolic and diastolic congestive heart failure Cocaine abuse Alcohol abuse Prolonged QTC  Significant Hospital Events   12/2 admitted post cardiac arrest, emergent cric, 33 C TTM 12/8-V. tach arrest 12/10 Cric changed to tracheostomy  Consults:  Nephrology Electrophysiology ENT  Procedures:  12/2 remeasuring cricothyrotomy per EMS  Significant Diagnostic Tests:  CTA 12/2-segmental filling defects in the right middle lobe and right lower lobe pulmonary arteries, pneumomediastinum, subcu emphysema CT head 12/8-no acute abnormality  Echocardiogram 11/17/19-LVEF less than 20%, normal LV systolic function  40/98 KUB: Bowel gas pattern c/w ileus, mildly improved from prior imaging.   Micro Data:  COVID 12/2 > negative Aspirate 12/04+ for Haemophilus influenza all/2 blood cultures no growth to date  Antimicrobials:  Unasyn 12/4>>12/7 Ceftriazone 12/7>>  Interim history/subjective:  Vomiting overnight. KUB c/w ileus but with some improvement from prior NGT removed, cortrak remains  Objective   Blood pressure (!) 78/55, pulse 71, temperature 98.6 F (37 C), temperature source Oral, resp. rate (!) 30, height 5' 10.5" (1.791 m), weight 69.9 kg, SpO2 100 %. CVP:  [12 mmHg-17 mmHg] 16 mmHg  Vent Mode: PRVC FiO2 (%):   [40 %-92 %] 40 % Set Rate:  [28 bmp] 28 bmp Vt Set:  [440 mL] 440 mL PEEP:  [5 cmH20] 5 cmH20 Pressure Support:  [10 cmH20-15 cmH20] 10 cmH20 Plateau Pressure:  [20 cmH20-22 cmH20] 20 cmH20   Intake/Output Summary (Last 24 hours) at 11/25/2019 1124 Last data filed at 11/25/2019 1000 Gross per 24 hour  Intake 1846.49 ml  Output 3364 ml  Net -1517.51 ml   Filed Weights   11/23/19 0411 11/24/19 0500 11/25/19 0415  Weight: 74.4 kg 70.8 kg 69.9 kg    Examination: Gen:      Middle aged M, NAD reclined in bed  HEENT:  NCAT. Trach site c/d/i.Marland Kitchen cortrack secure.  Lungs:    CTA bilaterally. Symmetrical chest expansion without accessory muscle use CV:         RRR s1s2 no rgm. Cap refill < 3 seconds  Abd:      Distended, non-tender. + bowel sounds  Ext:    Symmetrical bulk and tone no edema. No cyanosis or clubbing  Skin:      Warm and dry; no rash Neuro:  Lightly sedated. Awakens and following commands.  Lines:   L fem a line, R [place] CVC. Fem trialysis cath  [facility] Problem list     Assessment & Plan:   Cardiac arrest, with tachycardia Possibly PE but filling defects may be artifactual. Combined systolic and diastolic CHF, NSVT, prolonged QT, and polysubstance abuse  Cardiogenic shock, LVEF less than 20% P: -MAP goal > 65. NE for goal. Wean as able  -Heparin for possible PE   Acute hypoxic respiratory failure, now s/p tracheostomy  Emergent cricothyrotomy   -In the setting of post cardiac  arrest  P: -Goal is wean to trach collar -Continue pulm hygiene   New onset A-fib HX NSVT/V. fib arrest HX Prolonged QT Chronic combined systolic and diastolic congestive heart failure Nonischemic cardiomyopathy  -ECHO 11/10/18 with EF of 25-30% and grade 2 diastolicdysfunction  -Right and left heart cath 02/25/2016 with no angiographic evidence of CAD P: -Appreciate cardiology involvement -Continue amio and heparin -Continue ICU monitoring  Acute encephalopathy Has been  responsive per nursing P -Delirium precautions   ESRD  -Undergoing iHD at time of arrest, assuming regular schedule is MWF P: -nephrology following, off CRRT  -Remove femoral trialysis cath  -Continue midodrine  Polysubstance abuse  -Hx of cocaine, alcohol, and tobacco abuse P: -PAD protocol -Will need substance use cessation counseling prior to discharge   Ileus Hiccups n/v P: -replace NGT (cortrack is not suctioning well) -LIMS  -Hold EN-- enteral meds ok  -Compazine PRN for n/v/hiccups  Hypocalcemia Replacing 12/11  Best practice:  Diet: Hold EN  Pain/Anxiety/Delirium protocol (if indicated): fentanyl infusion VAP protocol (if indicated): yes, ordered DVT prophylaxis: heparin gtt GI prophylaxis: PPI Glucose control: yes less than 180 Foley: none Mobility: bed rest Code Status: Full Family Communication: Pending 12/11  Disposition: needs ICU   Critical care time:    CRITICAL CARE Performed by: Cristal Generous   Total critical care time: 35 minutes  Critical care time was exclusive of separately billable procedures and treating other patients. Critical care was necessary to treat or prevent imminent or life-threatening deterioration. Critical care was time spent personally by me on the following activities: development of treatment plan with patient and/or surrogate as well as nursing, discussions with consultants, evaluation of patient's response to treatment, examination of patient, obtaining history from patient or surrogate, ordering and performing treatments and interventions, ordering and review of laboratory studies, ordering and review of radiographic studies, pulse oximetry and re-evaluation of patient's condition.  Eliseo Gum MSN, AGACNP-BC [place] [phone] If no answer, [phone] 11/25/2019, 11:24 AM

## 2019-11-25 NOTE — Progress Notes (Signed)
[facility] ROUNDING NOTE   Subjective:   Is a 53 year old gentleman history of SVT systolic heart failure polysubstance abuse end-stage renal disease Monday Wednesday Friday dialysis [facility] kidney center status post PEA arrest 11/16/2019.  Occurred about 3-1/2 hours into hemodialysis treatment received 3 rounds of epinephrine 1 g of calcium 50 mEq of bicarbonate with ROSC of about 15 minutes.  Emergent cricothyroitomy performed by EMS due to failed intubation.  CRRT initiated 11/18/2019.   Daily Update:  seen in ICU, I/O negative 1 L yesterday, still on pressors    Objective:  Vital signs in last 24 hours:  Temp:  [97.5 F (36.4 C)-98.6 F (37 C)] 98.6 F (37 C) (12/11 0817) Pulse Rate:  [25-76] 58 (12/11 1000) Resp:  [14-35] 22 (12/11 1000) BP: (67-122)/(31-87) 78/55 (12/11 1000) SpO2:  [96 %-100 %] 96 % (12/11 1000) Arterial Line BP: (81-142)/(33-62) 102/43 (12/11 1000) FiO2 (%):  [40 %-92 %] 40 % (12/11 0833) Weight:  [69.9 kg] 69.9 kg (12/11 0415)  Weight change: -0.9 kg Filed Weights   11/23/19 0411 11/24/19 0500 11/25/19 0415  Weight: 74.4 kg 70.8 kg 69.9 kg    Intake/Output: I/O last 3 completed shifts: In: 2827.6 [I.V.:2417.6; NG/GT:50; IV Piggyback:360.1] Out: 5148 [Other:5148]   Intake/Output this shift:  Total I/O In: 178.5 [I.V.:178.5] Out: 325 [Other:324; Stool:1]  Exam Trach, vent, alert and responsive LUE AVF +B/T Brady, nl s1s2 Coarse bs b/l S/mild distension Mild LE edema Temp cath in place R femoral  HD OP: [facility] MWF  4h 75mn  74.5kg  2/2.5 bath  340/A1.5   P4  Hep 5000     C3 0.75 qTx     no ESA/Fe     Hb 12s usually  CXR 12/4 - bilat infiltrates / edema CXR 12/8 - no chg bilat layering effusion and IS/ alveloar infiltrates   Assessment:   ESRD- MWF HD.  Started on CRRT 11/19/2019.  Is now 4kg under dry wt, still requiring pressors. Will dc CRRT, hopefully BP's will improve.    BP/Volume - wt's down 4kg under dry  wt. Patchy infiltrates on CXR not likely vol related. Midodrine to 10 tid.  At OP HD pt's BP's are normal on coreg and Imdur home meds.  Status post arrest x 2- PEA at OP HD on 12/2, 118m until ROSC.  ECHO LV = 20% ejection fraction. Seen by EP team. Then VT arrest on 12/8, recovered.   New onset atrial fibrillation CHA2DS2-VASc score 3 - per primary   Chronic combined congestive heart failure:  EF 25-30% , now 20% not a candidate for ICD due to noncompliance and drug abuse  Prolonged QT   Cardiogenic shock: remains on pressors  ANEMIA-no major issue  MBD-severe chronic hyperphosphatemia  ACCESS-AV fistula  Diffuse nonspecific encephalopathy EEG performed   Emergent cricothyroidotomy / acute respiratory failure- per CCM  History of polysubstance abuse  CT scan of chest with suspicion for pulmonary embolus - IV heparin  RoKelly SplinterMD 11/24/2019, 10:35 AM      Basic Metabolic Panel: Recent Labs  Lab 11/21/19 0321 11/22/19 0408 11/22/19 0425 11/23/19 0300 11/23/19 1514 11/24/19 0324 11/24/19 1632 11/25/19 0359 11/25/19 0408  NA 135 135  --  133* 134* 135 136 137 135  K 4.2 4.3  --  4.8 4.3 4.3 4.2 4.1 4.0  CL 101 101   < > 100 103 102 102 101  --   CO2 22 22   < > 17* 16* 21* 20*  21*  --   GLUCOSE 103* 109*   < > 89 197* 116* 106* 117*  --   BUN 14 15   < > 22* 24* 25* 23* 21*  --   CREATININE 1.66* 1.70*   < > 2.17* 1.93* 1.86* 1.84* 1.71*  --   CALCIUM 8.3* 8.3*   < > 7.8* 7.5* 7.9* 7.7* 8.0*  --   MG 2.4 2.6*  --  2.5*  --  2.5*  --  2.5*  --   PHOS 1.8* 1.2*   < > 3.3 2.6 1.5* 1.8* 3.0  --    < > = values in this interval not displayed.    Liver Function Tests: Recent Labs  Lab 11/23/19 0300 11/23/19 1514 11/24/19 0324 11/24/19 1632 11/25/19 0359  ALBUMIN 2.3* 2.1* 2.3* 2.1* 2.3*   No results for input(s): LIPASE, AMYLASE in the last 168 hours. No results for input(s): AMMONIA in the last 168 hours.  CBC: Recent Labs  Lab 11/21/19 0321  11/22/19 0408 11/22/19 0603 11/23/19 0300 11/24/19 0324 11/25/19 0359 11/25/19 0408  WBC 9.3 11.3*  --  13.8* 19.0* 22.5*  --   NEUTROABS 7.8* 8.8*  --  9.6* 14.9* 18.0*  --   HGB 10.7* 10.4* 11.9* 10.8* 11.2* 11.3* 12.9*  HCT 33.2* 31.2* 35.0* 32.8* 32.7* 32.7* 38.0*  MCV 94.3 93.1  --  92.7 87.7 86.3  --   PLT 113* 115*  --  129* 164 189  --     Cardiac Enzymes: No results for input(s): CKTOTAL, CKMB, CKMBINDEX, TROPONINI in the last 168 hours.  BNP: Invalid input(s): POCBNP  CBG: Recent Labs  Lab 11/24/19 1752 11/24/19 2048 11/25/19 0101 11/25/19 0403 11/25/19 0813  GLUCAP 162* 88 129* 110* 57*     Medications:   .  prismasol BGK 4/2.5 400 mL/hr at 11/25/19 0424  .  prismasol BGK 4/2.5 200 mL/hr at 11/25/19 0527  . sodium chloride Stopped (11/24/19 1004)  . sodium chloride Stopped (11/22/19 1656)  . amiodarone 30 mg/hr (11/25/19 1000)  . cefTRIAXone (ROCEPHIN)  IV Stopped (11/24/19 1758)  . dexmedetomidine (PRECEDEX) IV infusion 0.8 mcg/kg/hr (11/25/19 1000)  . dextrose Stopped (11/22/19 1400)  . feeding supplement (VITAL 1.5 CAL) Stopped (11/23/19 1950)  . heparin 1,500 Units/hr (11/25/19 1000)  . norepinephrine (LEVOPHED) Adult infusion 12 mcg/min (11/25/19 1000)  . prismasol BGK 4/2.5 1,600 mL/hr at 11/25/19 0916   . B-complex with vitamin C  1 tablet Per Tube Daily  . chlorhexidine gluconate (MEDLINE KIT)  15 mL Mouth Rinse BID  . Chlorhexidine Gluconate Cloth  6 each Topical Daily  . docusate  100 mg Per Tube Daily  . feeding supplement (PRO-STAT SUGAR FREE 64)  30 mL Per Tube TID  . mouth rinse  15 mL Mouth Rinse 10 times per day  . midodrine  10 mg Per Tube Q8H  . oxyCODONE  5 mg Oral Q6H  . pantoprazole sodium  40 mg Per Tube QHS  . sodium chloride flush  10-40 mL Intracatheter Q12H  . white petrolatum   Topical Daily   sodium chloride, acetaminophen (TYLENOL) oral liquid 160 mg/5 mL, acetaminophen, alteplase, fentaNYL (SUBLIMAZE) injection,  heparin, midazolam, sodium chloride, sodium chloride flush

## 2019-11-25 NOTE — Progress Notes (Signed)
[facility] ROUNDING NOTE   Subjective:   Is a 53 year old gentleman history of SVT systolic heart failure polysubstance abuse end-stage renal disease Monday Wednesday Friday dialysis [facility] kidney center status post PEA arrest 11/16/2019.  Occurred about 3-1/2 hours into hemodialysis treatment received 3 rounds of epinephrine 1 g of calcium 50 mEq of bicarbonate with ROSC of about 15 minutes.  Emergent cricothyroitomy performed by EMS due to failed intubation.  CRRT initiated 11/18/2019.   Daily Update:  seen in ICU, I/O negative 1 L yest, still on pressors    Objective:  Vital signs in last 24 hours:  Temp:  [97.5 F (36.4 C)-98.6 F (37 C)] 98.6 F (37 C) (12/11 0817) Pulse Rate:  [25-76] 58 (12/11 1000) Resp:  [14-35] 22 (12/11 1000) BP: (67-122)/(31-87) 78/55 (12/11 1000) SpO2:  [96 %-100 %] 96 % (12/11 1000) Arterial Line BP: (81-142)/(33-62) 102/43 (12/11 1000) FiO2 (%):  [40 %-92 %] 40 % (12/11 0833) Weight:  [69.9 kg] 69.9 kg (12/11 0415)  Weight change: -0.9 kg Filed Weights   11/23/19 0411 11/24/19 0500 11/25/19 0415  Weight: 74.4 kg 70.8 kg 69.9 kg    Intake/Output: I/O last 3 completed shifts: In: 2827.6 [I.V.:2417.6; NG/GT:50; IV Piggyback:360.1] Out: 5148 [Other:5148]   Intake/Output this shift:  Total I/O In: 178.5 [I.V.:178.5] Out: 325 [Other:324; Stool:1]  Exam Cricothyrotomy, weaning vent on pressure support  responds to questions w/ head nods  LUE AVF +B/T Brady, nl s1s2 Coarse bs b/l S/mild distension Mild LE edema Temp cath in place  HD OP: [facility] MWF  4h 61mn  74.5kg  2/2.5 bath  340/A1.5   P4  Hep 5000     C3 0.75 qTx     no ESA/Fe     Hb 12s usually  CXR 12/4 - bilat infiltrates / edema CXR 12/8 - no chg bilat layering effusion and IS/ alveloar infiltrates   Assessment:   ESRD- MWF HD.  Started on CRRT 11/19/2019.  On pressors.   BP/Volume - wt's down 3kg under dry wt. Midodrine to 10 tid.  At OP HD pt's  BP's are normal on coreg and Imdur home meds.  Status post arrest x 2- PEA at OP HD on 12/2, 158m until ROSC.  ECHO LV = 20% ejection fraction. Seen by EP team. Then VT arrest on 12/8, recovered.   New onset atrial fibrillation CHA2DS2-VASc score 3 - per primary   Chronic combined congestive heart failure:  EF 25-30% , now 20% not a candidate for ICD due to noncompliance and drug abuse  Prolonged QT   Cardiogenic shock: remains on pressors  ANEMIA-no major issue  MBD-severe chronic hyperphosphatemia  ACCESS-AV fistula  Diffuse nonspecific encephalopathy EEG performed   Emergent cricothyroidotomy / acute respiratory failure- per CCM  History of polysubstance abuse  CT scan of chest with suspicion for pulmonary embolus - IV heparin  RoKelly SplinterMD 11/24/2019, 10:33 AM      Basic Metabolic Panel: Recent Labs  Lab 11/21/19 0321 11/22/19 0408 11/22/19 0425 11/23/19 0300 11/23/19 1514 11/24/19 0324 11/24/19 1632 11/25/19 0359 11/25/19 0408  NA 135 135  --  133* 134* 135 136 137 135  K 4.2 4.3  --  4.8 4.3 4.3 4.2 4.1 4.0  CL 101 101   < > 100 103 102 102 101  --   CO2 22 22   < > 17* 16* 21* 20* 21*  --   GLUCOSE 103* 109*   < > 89 197* 116* 106*  117*  --   BUN 14 15   < > 22* 24* 25* 23* 21*  --   CREATININE 1.66* 1.70*   < > 2.17* 1.93* 1.86* 1.84* 1.71*  --   CALCIUM 8.3* 8.3*   < > 7.8* 7.5* 7.9* 7.7* 8.0*  --   MG 2.4 2.6*  --  2.5*  --  2.5*  --  2.5*  --   PHOS 1.8* 1.2*   < > 3.3 2.6 1.5* 1.8* 3.0  --    < > = values in this interval not displayed.    Liver Function Tests: Recent Labs  Lab 11/23/19 0300 11/23/19 1514 11/24/19 0324 11/24/19 1632 11/25/19 0359  ALBUMIN 2.3* 2.1* 2.3* 2.1* 2.3*   No results for input(s): LIPASE, AMYLASE in the last 168 hours. No results for input(s): AMMONIA in the last 168 hours.  CBC: Recent Labs  Lab 11/21/19 0321 11/22/19 0408 11/22/19 0603 11/23/19 0300 11/24/19 0324 11/25/19 0359 11/25/19 0408   WBC 9.3 11.3*  --  13.8* 19.0* 22.5*  --   NEUTROABS 7.8* 8.8*  --  9.6* 14.9* 18.0*  --   HGB 10.7* 10.4* 11.9* 10.8* 11.2* 11.3* 12.9*  HCT 33.2* 31.2* 35.0* 32.8* 32.7* 32.7* 38.0*  MCV 94.3 93.1  --  92.7 87.7 86.3  --   PLT 113* 115*  --  129* 164 189  --     Cardiac Enzymes: No results for input(s): CKTOTAL, CKMB, CKMBINDEX, TROPONINI in the last 168 hours.  BNP: Invalid input(s): POCBNP  CBG: Recent Labs  Lab 11/24/19 1752 11/24/19 2048 11/25/19 0101 11/25/19 0403 11/25/19 0813  GLUCAP 162* 88 129* 110* 57*     Medications:   .  prismasol BGK 4/2.5 400 mL/hr at 11/25/19 0424  .  prismasol BGK 4/2.5 200 mL/hr at 11/25/19 0527  . sodium chloride Stopped (11/24/19 1004)  . sodium chloride Stopped (11/22/19 1656)  . amiodarone 30 mg/hr (11/25/19 1000)  . cefTRIAXone (ROCEPHIN)  IV Stopped (11/24/19 1758)  . dexmedetomidine (PRECEDEX) IV infusion 0.8 mcg/kg/hr (11/25/19 1000)  . dextrose Stopped (11/22/19 1400)  . feeding supplement (VITAL 1.5 CAL) Stopped (11/23/19 1950)  . heparin 1,500 Units/hr (11/25/19 1000)  . norepinephrine (LEVOPHED) Adult infusion 12 mcg/min (11/25/19 1000)  . prismasol BGK 4/2.5 1,600 mL/hr at 11/25/19 0916   . B-complex with vitamin C  1 tablet Per Tube Daily  . chlorhexidine gluconate (MEDLINE KIT)  15 mL Mouth Rinse BID  . Chlorhexidine Gluconate Cloth  6 each Topical Daily  . docusate  100 mg Per Tube Daily  . feeding supplement (PRO-STAT SUGAR FREE 64)  30 mL Per Tube TID  . mouth rinse  15 mL Mouth Rinse 10 times per day  . midodrine  10 mg Per Tube Q8H  . oxyCODONE  5 mg Oral Q6H  . pantoprazole sodium  40 mg Per Tube QHS  . sodium chloride flush  10-40 mL Intracatheter Q12H  . white petrolatum   Topical Daily   sodium chloride, acetaminophen (TYLENOL) oral liquid 160 mg/5 mL, acetaminophen, alteplase, fentaNYL (SUBLIMAZE) injection, heparin, midazolam, sodium chloride, sodium chloride flush

## 2019-11-25 NOTE — Progress Notes (Signed)
Progress Note  Patient Name: [name] [name] Date of Encounter: 11/25/2019  Primary Cardiologist: Fransico Him, MD   Subjective   Awake and alert, follows commands, weak.  C/o belly discomfort, has hiccups  Inpatient Medications    Scheduled Meds: . B-complex with vitamin C  1 tablet Per Tube Daily  . chlorhexidine gluconate (MEDLINE KIT)  15 mL Mouth Rinse BID  . Chlorhexidine Gluconate Cloth  6 each Topical Daily  . docusate  100 mg Per Tube Daily  . feeding supplement (PRO-STAT SUGAR FREE 64)  30 mL Per Tube TID  . mouth rinse  15 mL Mouth Rinse 10 times per day  . midodrine  10 mg Per Tube Q8H  . oxyCODONE  5 mg Oral Q6H  . pantoprazole sodium  40 mg Per Tube QHS  . sodium chloride flush  10-40 mL Intracatheter Q12H  . white petrolatum   Topical Daily   Continuous Infusions: .  prismasol BGK 4/2.5 400 mL/hr at 11/25/19 0424  .  prismasol BGK 4/2.5 200 mL/hr at 11/25/19 0527  . sodium chloride Stopped (11/24/19 1004)  . sodium chloride Stopped (11/22/19 1656)  . amiodarone 30 mg/hr (11/25/19 0700)  . cefTRIAXone (ROCEPHIN)  IV Stopped (11/24/19 1758)  . dexmedetomidine (PRECEDEX) IV infusion 0.8 mcg/kg/hr (11/24/19 1900)  . dextrose Stopped (11/22/19 1400)  . feeding supplement (VITAL 1.5 CAL) Stopped (11/23/19 1950)  . heparin 1,500 Units/hr (11/25/19 0700)  . norepinephrine (LEVOPHED) Adult infusion 26 mcg/min (11/25/19 0700)  . prismasol BGK 4/2.5 1,600 mL/hr at 11/25/19 0515   PRN Meds: sodium chloride, acetaminophen (TYLENOL) oral liquid 160 mg/5 mL, acetaminophen, alteplase, fentaNYL (SUBLIMAZE) injection, heparin, midazolam, sodium chloride, sodium chloride flush   Vital Signs    Vitals:   11/25/19 0615 11/25/19 0630 11/25/19 0645 11/25/19 0700  BP: 92/69 103/72 100/72 91/73  Pulse:    65  Resp: (!) 28 (!) 28 (!) 28 (!) 28  Temp:      TempSrc:      SpO2: 100% 100%  100%  Weight:      Height:        Intake/Output Summary (Last 24 hours) at  11/25/2019 0806 Last data filed at 11/25/2019 0700 Gross per 24 hour  Intake 1858.82 ml  Output 3197 ml  Net -1338.18 ml   Last 3 Weights 11/25/2019 11/24/2019 11/23/2019  Weight (lbs) 154 lb 1.6 oz 156 lb 1.4 oz 164 lb 0.4 oz  Weight (kg) 69.9 kg 70.8 kg 74.4 kg      Telemetry    SR/SB, PAFib, HR generally 50's-60's, much less PVCs, rare NSVT - Personally Reviewed  ECG    No new EKGs - Personally Reviewed  Physical Exam   GEN: No acute distress.  Awake, alert Neck: trach Cardiac: RRR, no murmurs, rubs, or gallops.  Respiratory: CTA b/l. GI: somewhat distended, nontender to light palp  MS: No edema; No deformity. Neuro:  follows commands appropriately Psych: difficult to establish   Labs    High Sensitivity Troponin:   Recent Labs  Lab 11/16/19 1141 11/16/19 1238 11/16/19 1730  TROPONINIHS 171* 243* 679*      Chemistry Recent Labs  Lab 11/24/19 0324 11/24/19 1632 11/25/19 0359 11/25/19 0408  NA 135 136 137 135  K 4.3 4.2 4.1 4.0  CL 102 102 101  --   CO2 21* 20* 21*  --   GLUCOSE 116* 106* 117*  --   BUN 25* 23* 21*  --   CREATININE 1.86* 1.84* 1.71*  --  CALCIUM 7.9* 7.7* 8.0*  --   ALBUMIN 2.3* 2.1* 2.3*  --   GFRNONAA 41* 42* 45*  --   GFRAA 47* 48* 53*  --   ANIONGAP 12 14 15   --      Hematology Recent Labs  Lab 11/23/19 0300 11/24/19 0324 11/25/19 0359 11/25/19 0408  WBC 13.8* 19.0* 22.5*  --   RBC 3.54* 3.73* 3.79*  --   HGB 10.8* 11.2* 11.3* 12.9*  HCT 32.8* 32.7* 32.7* 38.0*  MCV 92.7 87.7 86.3  --   MCH 30.5 30.0 29.8  --   MCHC 32.9 34.3 34.6  --   RDW 16.8* 16.4* 16.8*  --   PLT 129* 164 189  --     BNPNo results for input(s): BNP, PROBNP in the last 168 hours.   DDimer No results for input(s): DDIMER in the last 168 hours.   Radiology    DG Abd 1 View Result Date: 11/23/2019 CLINICAL DATA:  Recent CPR with abdominal distension EXAM: ABDOMEN - 1 VIEW COMPARISON:  11/20/2019 FINDINGS: Feeding catheter extends into the  gastric lumen. The nasogastric catheter has withdrawn and the tip lies just above the gastroesophageal junction. Proximal side port also lies above the gastroesophageal junction. Right femoral central line is noted. Scattered large and small bowel gas is noted without obstructive change. This may represent a generalized ileus. IMPRESSION: Feeding catheter within the stomach. Nasogastric catheter has withdrawn into the distal esophagus and should be readvanced. Scattered large and small bowel gas without obstructive change. This may represent a mild ileus. Correlation with the physical exam is recommended. Electronically Signed   By: Inez Catalina M.D.   On: 11/23/2019 21:11    Cardiac Studies   Echo 11/11/19: Study Conclusions - Left ventricle: The cavity size was mildly dilated. Systolic function was severely reduced. The estimated ejection fraction was in the range of 25% to 30%. Diffuse hypokinesis. Although no diagnostic regional wall motion abnormality was identified, this possibility cannot be completely excluded on the basis of this study. Features are consistent with a pseudonormal left ventricular filling pattern, with concomitant abnormal relaxation and increased filling pressure (grade 2 diastolic dysfunction). - Aortic valve: Transvalvular velocity was within the normal range. There was no stenosis. There was no regurgitation. - Mitral valve: Transvalvular velocity was within the normal range. There was no evidence for stenosis. There was trivial regurgitation. - Left atrium: The atrium was severely dilated. - Right ventricle: The cavity size was moderately dilated. Wall thickness was normal. Systolic function was moderately reduced. RV systolic pressure (S, est): 47 mm Hg. - Right atrium: The atrium was moderately dilated. - Tricuspid valve: There was mild-moderate regurgitation. - Pulmonic valve: There was trivial regurgitation. - Pulmonary arteries:  The main pulmonary artery was mildly dilated. Systolic pressure was moderately increased. PA peak pressure: 47 mm Hg (S). - Inferior vena cava: The vessel was dilated. The respirophasic diameter changes were blunted (<50%), consistent with elevated central venous pressure. - Pericardium, extracardiac: There was no pericardial effusion.   02/25/2016: LHC . No angiographic evidence of CAD 2. Moderate pulmonary HTN 3. Elevated LV filling pressures.    11/11/2019 25-30% 02/21/16 LVEF 25-30% 2014 LVEF 40-45%  Patient Profile     53 y.o. male with a hx of chronic CHF (combined), ESRF onHD, poly substance abuse (ETOH, cocaine), h/o VT in setting of cocaine, HTN urgency (2011), lost to cardiology follow up since 2018  Admitted to [facility] 11/16/2019 after a PEA arrest he had while on  HD  >> post arrest in AFib (new) Cardiology followed, noted development of prolonged QT suspected 2/2 cooling (not present on initial EKG), not felt to be an ischemic event, not planned for ischemic w/u, Etiology unclear ? PE, aspiration, metabolic, acute volume shift in setting of poor LV function.  They last saw him 11/19/2019, Overall prognosis felt to be very poor, the patient was not felt to be a candidate for any advanced therapies given chronic CHF, CKD stage V and ongoing drug abuse (no tox screen done this admission). Cardiology signed off 11/22/2019  11/22/2019 VT arrest, restarted on amio gtt and started on lido gtt EP consulted Lidocaine stopped amio continued    Assessment & Plan    1. Admitted 11/16/2019 post PEA arrest while at HD     Required pressors since admission     Midodrine added  Cardiology signed off 11/19/2019 no plans for ischemic w/u given no CAD by cath 2017, overall poor prognosis  3. NICM     LVEF 25-30%     On pressors, no BB/ARB     Volume management with CRRT  3. VT arrest      V ectopy is much less, rare NSVT     In/out of AFib, 60's     On heparin gtt     On  amio gtt     Continue both  4. Post arrest, new onset Paroxysmal Afib     CHA2DS2Vasc is 2     Rate controlled     On heparin gtt  5. Post arrest encephalopathy     Off precedex, much better     Follows commands, awake and alert     C/w CCM  6. abd distention, vomiting     OG was noted high and renoved     Pending KUB     On/off hiccups     C/w CCM  7. H. Influenza     Leukocytosis continues to rise     BC x2 are neg 5 days     Afebrile last 24hrs     C/w CCM  8. ESRF on HD out pt, CRRT here     C/w nephrology       10. Hypotension        Continues to require pressor, on levo 18 this AM       Getting Midodrine 33m TID       C/w CCM  11. Repsiratory failure       Emergent cric by EMS >> trach yesterday       C/w CCM  12. Vomiting on/off       RN mentioned OG tube since removed was in esophagus       Some belly distension       hiccups       KUB done, pending       Deferred to CCM/attending    EP will continue to follow, he is improving though remains pressor dependent He is not a candidate currently for EP procedures or implant Continue amiodarone If he improves, able to get off pressors, may be a life vest and outpatient implant, final recommendations pending his clinical course      For questions or updates, please contact [facility] Please consult [email or web address] for contact info under        Signed, RBaldwin Jamaica PA-C  11/25/2019, 8:06 AM

## 2019-11-25 NOTE — Plan of Care (Signed)
  Problem: Education: Goal: Ability to manage disease process will improve Outcome: Progressing   Problem: Neurologic: Goal: Promote progressive neurologic recovery Outcome: Progressing   Problem: Education: Goal: Knowledge of General Education information will improve Description: Including pain rating scale, medication(s)/side effects and non-pharmacologic comfort measures Outcome: Progressing   Problem: Health Behavior/Discharge Planning: Goal: Ability to manage health-related needs will improve Outcome: Progressing   Problem: Clinical Measurements: Goal: Ability to maintain clinical measurements within normal limits will improve Outcome: Progressing Goal: Will remain free from infection Outcome: Progressing Goal: Diagnostic test results will improve Outcome: Progressing Goal: Respiratory complications will improve Outcome: Progressing Goal: Cardiovascular complication will be avoided Outcome: Progressing   Problem: Coping: Goal: Level of anxiety will decrease Outcome: Progressing   Problem: Elimination: Goal: Will not experience complications related to bowel motility Outcome: Progressing Goal: Will not experience complications related to urinary retention Outcome: Progressing   Problem: Safety: Goal: Ability to remain free from injury will improve Outcome: Progressing

## 2019-11-25 NOTE — Progress Notes (Signed)
ANTICOAGULATION CONSULT NOTE   Pharmacy Consult for Heparin Indication: atrial fibrillation and pulmonary embolus  Patient Measurements: Height: 5' 10.5" (179.1 cm) Weight: 154 lb 1.6 oz (69.9 kg) IBW/kg (Calculated) : 74.15  Vital Signs: Temp: 98.6 F (37 C) (12/11 0817) Temp Source: Oral (12/11 0817) BP: 91/73 (12/11 0700) Pulse Rate: 65 (12/11 0700)  Labs: Recent Labs    11/23/19 0300 11/23/19 0445 11/24/19 0324 11/24/19 1632 11/25/19 0359 11/25/19 0408  HGB 10.8*  --  11.2*  --  11.3* 12.9*  HCT 32.8*  --  32.7*  --  32.7* 38.0*  PLT 129*  --  164  --  189  --   APTT  --  >200* 171*  --  133*  --   HEPARINUNFRC  --  0.55 0.47  --  0.35  --   CREATININE 2.17*  --  1.86* 1.84* 1.71*  --     Estimated Creatinine Clearance: 50.5 mL/min (A) (by C-G formula based on SCr of 1.71 mg/dL (H)).  Assessment: 53 yo male s/p VT arrest and with VDRF and on CRRT. He is on heparin for with new onset afib and possible PE. He is noted with poor prognosis -heparin level at goal, hg= 12.9, plt= 135    Goal of Therapy:  Heparin level 0.3-0.7 units/ml  Monitor platelets by anticoagulation protocol: Yes   Plan:  Continue heparin at 1500 units/hr Monitor daily heparin level, CBC  Hildred Laser, PharmD Clinical Pharmacist **Pharmacist phone directory can now be found on [email or web address] ([email or web address]).  Listed under [place].

## 2019-11-25 NOTE — Progress Notes (Signed)
RN observed patient burping and spitting up medication and water from stomach. Patient also had 150 mL of fluid in the suction cannister that is attached to the nasogastric tube. Tube medications held. RN notified Ivin Booty, RN from [place]. Will notify 1st shift RN.

## 2019-11-26 DIAGNOSIS — I4891 Unspecified atrial fibrillation: Secondary | ICD-10-CM

## 2019-11-26 LAB — CBC WITH DIFFERENTIAL/PLATELET
Abs Immature Granulocytes: 0.79 10*3/uL — ABNORMAL HIGH (ref 0.00–0.07)
Basophils Absolute: 0.1 10*3/uL (ref 0.0–0.1)
Basophils Relative: 0 %
Eosinophils Absolute: 0.1 10*3/uL (ref 0.0–0.5)
Eosinophils Relative: 0 %
HCT: 28.1 % — ABNORMAL LOW (ref 39.0–52.0)
Hemoglobin: 10 g/dL — ABNORMAL LOW (ref 13.0–17.0)
Immature Granulocytes: 4 %
Lymphocytes Relative: 6 %
Lymphs Abs: 1.2 10*3/uL (ref 0.7–4.0)
MCH: 30.3 pg (ref 26.0–34.0)
MCHC: 35.6 g/dL (ref 30.0–36.0)
MCV: 85.2 fL (ref 80.0–100.0)
Monocytes Absolute: 1.5 10*3/uL — ABNORMAL HIGH (ref 0.1–1.0)
Monocytes Relative: 7 %
Neutro Abs: 16.6 10*3/uL — ABNORMAL HIGH (ref 1.7–7.7)
Neutrophils Relative %: 83 %
Platelets: 161 10*3/uL (ref 150–400)
RBC: 3.3 MIL/uL — ABNORMAL LOW (ref 4.22–5.81)
RDW: 16.8 % — ABNORMAL HIGH (ref 11.5–15.5)
WBC: 20.2 10*3/uL — ABNORMAL HIGH (ref 4.0–10.5)
nRBC: 1.4 % — ABNORMAL HIGH (ref 0.0–0.2)

## 2019-11-26 LAB — BASIC METABOLIC PANEL
Anion gap: 14 (ref 5–15)
BUN: 39 mg/dL — ABNORMAL HIGH (ref 6–20)
CO2: 20 mmol/L — ABNORMAL LOW (ref 22–32)
Calcium: 7.8 mg/dL — ABNORMAL LOW (ref 8.9–10.3)
Chloride: 102 mmol/L (ref 98–111)
Creatinine, Ser: 3.96 mg/dL — ABNORMAL HIGH (ref 0.61–1.24)
GFR calc Af Amer: 19 mL/min — ABNORMAL LOW (ref >60)
GFR calc non Af Amer: 16 mL/min — ABNORMAL LOW (ref >60)
Glucose, Bld: 122 mg/dL — ABNORMAL HIGH (ref 70–99)
Potassium: 3.5 mmol/L (ref 3.5–5.1)
Sodium: 136 mmol/L (ref 135–145)

## 2019-11-26 LAB — GLUCOSE, CAPILLARY
Glucose-Capillary: 102 mg/dL — ABNORMAL HIGH (ref 70–99)
Glucose-Capillary: 120 mg/dL — ABNORMAL HIGH (ref 70–99)
Glucose-Capillary: 127 mg/dL — ABNORMAL HIGH (ref 70–99)
Glucose-Capillary: 129 mg/dL — ABNORMAL HIGH (ref 70–99)
Glucose-Capillary: 132 mg/dL — ABNORMAL HIGH (ref 70–99)
Glucose-Capillary: 135 mg/dL — ABNORMAL HIGH (ref 70–99)

## 2019-11-26 LAB — HEPARIN LEVEL (UNFRACTIONATED): Heparin Unfractionated: 0.44 IU/mL (ref 0.30–0.70)

## 2019-11-26 NOTE — Progress Notes (Signed)
[facility] ROUNDING NOTE   Subjective:   Is a 53 year old gentleman history of SVT systolic heart failure polysubstance abuse end-stage renal disease Monday Wednesday Friday dialysis [facility] kidney center status post PEA arrest 11/16/2019.  Occurred about 3-1/2 hours into hemodialysis treatment received 3 rounds of epinephrine 1 g of calcium 50 mEq of bicarbonate with ROSC of about 15 minutes.  Emergent cricothyroitomy performed by EMS due to failed intubation.  CRRT initiated 11/18/2019.   Daily Update:  seen in ICU, up in bed interactive, eating ice   Objective:  Vital signs in last 24 hours:  Temp:  [98.8 F (37.1 C)-100.8 F (38.2 C)] 98.9 F (37.2 C) (12/12 0807) Pulse Rate:  [67-89] 71 (12/12 1122) Resp:  [14-32] 28 (12/12 1122) BP: (72-128)/(23-93) 128/60 (12/12 1122) SpO2:  [90 %-100 %] 100 % (12/12 1122) Arterial Line BP: (93-148)/(40-81) 106/43 (12/12 1100) FiO2 (%):  [40 %] 40 % (12/12 1122) Weight:  [69.1 kg] 69.1 kg (12/12 0500)  Weight change: -0.8 kg Filed Weights   11/24/19 0500 11/25/19 0415 11/26/19 0500  Weight: 70.8 kg 69.9 kg 69.1 kg    Intake/Output: I/O last 3 completed shifts: In: 2377.5 [I.V.:1877.4; NG/GT:90; IV Piggyback:410.1] Out: 2367 [Emesis/NG output:300; BDZHG:9924; Stool:1]   Intake/Output this shift:  No intake/output data recorded.  Exam Trach, vent, alert and responsive LUE AVF +B/T Brady, nl s1s2 Coarse bs b/l S/mild distension Mild LE edema Temp cath in place R femoral  HD OP: [facility]  4h 68mn  74.5kg  2/2.5 bath  340/A1.5   P4  Hep 5000     C3 0.75 qTx     no ESA/Fe     Hb 12s usually  CXR 12/8 - no chg bilat layering effusion and IS/ alveloar infiltrates   Assessment:   ESRD- MWF HD.  Started on CRRT 11/19/2019.  DMyrtleCRRT 12/11. No need HD for HD today, let vol come up and plan reg HD prob Monday.   BP/Volume - wt's down 4kg under dry wt. Patchy infiltrates on CXR not likely vol related.  Midodrine to 10 tid. Weaning pressors.  At OP HD pt's BP's are normal on coreg and Imdur home meds.  Status post arrest x 2- PEA at OP HD on 12/2, 133m until ROSC.  ECHO LV = 20% ejection fraction. Seen by EP team. Then VT arrest on 12/8, recovered.   New onset atrial fibrillation CHA2DS2-VASc score 3 - per primary   Chronic combined congestive heart failure:  EF 25-30% , now 20% not a candidate for ICD due to noncompliance and drug abuse  Prolonged QT   Cardiogenic shock: remains on pressors  ANEMIA-no major issue  MBD-severe chronic hyperphosphatemia  ACCESS-AV fistula  Diffuse nonspecific encephalopathy EEG performed   Emergent cricothyroidotomy / acute respiratory failure- per CCM  History of polysubstance abuse  CT scan of chest with suspicion for pulmonary embolus - IV heparin  RoKelly SplinterMD 11/24/2019, 11:31 AM      Basic Metabolic Panel: Recent Labs  Lab 11/21/19 0321 11/22/19 0408 11/22/19 0425 11/23/19 0300 11/23/19 1514 11/24/19 0324 11/24/19 1632 11/25/19 0359 11/25/19 0408 11/26/19 0406  NA 135 135  --  133* 134* 135 136 137 135 136  K 4.2 4.3  --  4.8 4.3 4.3 4.2 4.1 4.0 3.5  CL 101 101   < > 100 103 102 102 101  --  102  CO2 22 22   < > 17* 16* 21* 20* 21*  --  20*  GLUCOSE 103* 109*   < > 89 197* 116* 106* 117*  --  122*  BUN 14 15   < > 22* 24* 25* 23* 21*  --  39*  CREATININE 1.66* 1.70*   < > 2.17* 1.93* 1.86* 1.84* 1.71*  --  3.96*  CALCIUM 8.3* 8.3*   < > 7.8* 7.5* 7.9* 7.7* 8.0*  --  7.8*  MG 2.4 2.6*  --  2.5*  --  2.5*  --  2.5*  --   --   PHOS 1.8* 1.2*   < > 3.3 2.6 1.5* 1.8* 3.0  --   --    < > = values in this interval not displayed.    Liver Function Tests: Recent Labs  Lab 11/23/19 0300 11/23/19 1514 11/24/19 0324 11/24/19 1632 11/25/19 0359  ALBUMIN 2.3* 2.1* 2.3* 2.1* 2.3*   No results for input(s): LIPASE, AMYLASE in the last 168 hours. No results for input(s): AMMONIA in the last 168 hours.  CBC: Recent Labs   Lab 11/22/19 0408 11/23/19 0300 11/24/19 0324 11/25/19 0359 11/25/19 0408 11/26/19 0406  WBC 11.3* 13.8* 19.0* 22.5*  --  20.2*  NEUTROABS 8.8* 9.6* 14.9* 18.0*  --  16.6*  HGB 10.4* 10.8* 11.2* 11.3* 12.9* 10.0*  HCT 31.2* 32.8* 32.7* 32.7* 38.0* 28.1*  MCV 93.1 92.7 87.7 86.3  --  85.2  PLT 115* 129* 164 189  --  161    Cardiac Enzymes: No results for input(s): CKTOTAL, CKMB, CKMBINDEX, TROPONINI in the last 168 hours.  BNP: Invalid input(s): POCBNP  CBG: Recent Labs  Lab 11/25/19 1618 11/25/19 1951 11/25/19 2338 11/26/19 0319 11/26/19 0806  GLUCAP 107* 109* 131* 135* 102*     Medications:   . sodium chloride Stopped (11/22/19 1656)  . amiodarone 30 mg/hr (11/26/19 0300)  . cefTRIAXone (ROCEPHIN)  IV Stopped (11/25/19 2012)  . dexmedetomidine (PRECEDEX) IV infusion 0.8 mcg/kg/hr (11/25/19 1400)  . dextrose Stopped (11/22/19 1400)  . feeding supplement (VITAL 1.5 CAL) Stopped (11/23/19 1950)  . heparin 1,500 Units/hr (11/26/19 0300)  . norepinephrine (LEVOPHED) Adult infusion 16 mcg/min (11/26/19 0300)   . B-complex with vitamin C  1 tablet Per Tube Daily  . chlorhexidine gluconate (MEDLINE KIT)  15 mL Mouth Rinse BID  . Chlorhexidine Gluconate Cloth  6 each Topical Daily  . docusate  100 mg Per Tube Daily  . feeding supplement (PRO-STAT SUGAR FREE 64)  30 mL Per Tube TID  . mouth rinse  15 mL Mouth Rinse 10 times per day  . midodrine  10 mg Per Tube Q8H  . oxyCODONE  5 mg Oral Q6H  . pantoprazole sodium  40 mg Per Tube QHS  . sodium chloride flush  10-40 mL Intracatheter Q12H  . white petrolatum   Topical Daily   sodium chloride, acetaminophen (TYLENOL) oral liquid 160 mg/5 mL, acetaminophen, fentaNYL (SUBLIMAZE) injection, midazolam, prochlorperazine, sodium chloride flush

## 2019-11-26 NOTE — Progress Notes (Signed)
ANTICOAGULATION CONSULT NOTE   Pharmacy Consult for Heparin Indication: atrial fibrillation and pulmonary embolus  Patient Measurements: Height: 5' 10.5" (179.1 cm) Weight: 152 lb 5.4 oz (69.1 kg) IBW/kg (Calculated) : 74.15  Vital Signs: Temp: 98.8 F (37.1 C) (12/12 0321) Temp Source: Oral (12/12 0321) BP: 102/67 (12/12 0630) Pulse Rate: 67 (12/12 0630)  Labs: Recent Labs    11/24/19 0324 11/24/19 1632 11/25/19 0359 11/25/19 0408 11/26/19 0406  HGB 11.2*  --  11.3* 12.9* 10.0*  HCT 32.7*  --  32.7* 38.0* 28.1*  PLT 164  --  189  --  161  APTT 171*  --  133*  --   --   HEPARINUNFRC 0.47  --  0.35  --  0.44  CREATININE 1.86* 1.84* 1.71*  --   --     Estimated Creatinine Clearance: 50 mL/min (A) (by C-G formula based on SCr of 1.71 mg/dL (H)).  Assessment: 53 yo male s/p VT arrest and with VDRF and on CRRT. He is on heparin for with new onset afib and possible PE. He is noted with poor prognosis -heparin level at goal 0.44, hg down slightly to 10, pltc stable 161  Goal of Therapy:  Heparin level 0.3-0.7 units/ml  Monitor platelets by anticoagulation protocol: Yes   Plan:  Continue heparin at 1500 units/hr Monitor daily heparin level, CBC  Vertis Kelch, PharmD, [facility] PGY2 Cardiology Pharmacy Resident Phone [phone] 11/26/2019       7:28 AM  Please check [email or web address] for unit-specific pharmacist phone numbers

## 2019-11-26 NOTE — Progress Notes (Signed)
Progress Note  Patient Name: Pranshu Lyster Date of Encounter: 11/26/2019  Primary Cardiologist: Fransico Him, MD   Subjective   No useful or specific complaints from the patient.  Inpatient Medications    Scheduled Meds: . B-complex with vitamin C  1 tablet Per Tube Daily  . chlorhexidine gluconate (MEDLINE KIT)  15 mL Mouth Rinse BID  . Chlorhexidine Gluconate Cloth  6 each Topical Daily  . docusate  100 mg Per Tube Daily  . feeding supplement (PRO-STAT SUGAR FREE 64)  30 mL Per Tube TID  . mouth rinse  15 mL Mouth Rinse 10 times per day  . midodrine  10 mg Per Tube Q8H  . oxyCODONE  5 mg Oral Q6H  . pantoprazole sodium  40 mg Per Tube QHS  . sodium chloride flush  10-40 mL Intracatheter Q12H  . white petrolatum   Topical Daily   Continuous Infusions: . sodium chloride Stopped (11/22/19 1656)  . amiodarone 30 mg/hr (11/26/19 0300)  . cefTRIAXone (ROCEPHIN)  IV Stopped (11/25/19 2012)  . dexmedetomidine (PRECEDEX) IV infusion 0.8 mcg/kg/hr (11/25/19 1400)  . dextrose Stopped (11/22/19 1400)  . feeding supplement (VITAL 1.5 CAL) Stopped (11/23/19 1950)  . heparin 1,500 Units/hr (11/26/19 0300)  . norepinephrine (LEVOPHED) Adult infusion 16 mcg/min (11/26/19 0300)   PRN Meds: sodium chloride, acetaminophen (TYLENOL) oral liquid 160 mg/5 mL, acetaminophen, fentaNYL (SUBLIMAZE) injection, midazolam, prochlorperazine, sodium chloride flush   Vital Signs    Vitals:   11/26/19 0630 11/26/19 0700 11/26/19 0736 11/26/19 0807  BP: 102/67 105/70    Pulse: 67 69 67   Resp: (!) 22 (!) 29 (!) 29   Temp:    98.9 F (37.2 C)  TempSrc:    Oral  SpO2: 100% 100% 100%   Weight:      Height:        Intake/Output Summary (Last 24 hours) at 11/26/2019 1011 Last data filed at 11/26/2019 0300 Gross per 24 hour  Intake 1068.78 ml  Output 300 ml  Net 768.78 ml   Last 3 Weights 11/26/2019 11/25/2019 11/24/2019  Weight (lbs) 152 lb 5.4 oz 154 lb 1.6 oz 156 lb 1.4 oz  Weight  (kg) 69.1 kg 69.9 kg 70.8 kg      Telemetry    Sinus rhythm we will monitor- Personally Reviewed  ECG    No repeat- Personally Reviewed  Physical Exam  Appears older than stated age.  Currently on vent. GEN: No acute distress.  Lying flat in bed. Neck: No JVD Cardiac: RRR, no murmurs, rubs, or gallops.  Respiratory: Clear to auscultation bilaterally. GI: Soft, nontender, non-distended  MS: No edema; No deformity. Neuro:  Nonfocal  Psych: Normal affect   Labs    High Sensitivity Troponin:   Recent Labs  Lab 11/16/19 1141 11/16/19 1238 11/16/19 1730  TROPONINIHS 171* 243* 679*      Chemistry Recent Labs  Lab 11/24/19 0324 11/24/19 1632 11/25/19 0359 11/25/19 0408 11/26/19 0406  NA 135 136 137 135 136  K 4.3 4.2 4.1 4.0 3.5  CL 102 102 101  --  102  CO2 21* 20* 21*  --  20*  GLUCOSE 116* 106* 117*  --  122*  BUN 25* 23* 21*  --  39*  CREATININE 1.86* 1.84* 1.71*  --  3.96*  CALCIUM 7.9* 7.7* 8.0*  --  7.8*  ALBUMIN 2.3* 2.1* 2.3*  --   --   GFRNONAA 41* 42* 45*  --  16*  GFRAA 47* 48*  53*  --  19*  ANIONGAP '12 14 15  '$ --  14     Hematology Recent Labs  Lab 11/24/19 0324 11/25/19 0359 11/25/19 0408 11/26/19 0406  WBC 19.0* 22.5*  --  20.2*  RBC 3.73* 3.79*  --  3.30*  HGB 11.2* 11.3* 12.9* 10.0*  HCT 32.7* 32.7* 38.0* 28.1*  MCV 87.7 86.3  --  85.2  MCH 30.0 29.8  --  30.3  MCHC 34.3 34.6  --  35.6  RDW 16.4* 16.8*  --  16.8*  PLT 164 189  --  161    BNPNo results for input(s): BNP, PROBNP in the last 168 hours.   DDimer No results for input(s): DDIMER in the last 168 hours.   Radiology    DG Chest Port 1 View  Result Date: 11/25/2019 CLINICAL DATA:  54 year old male with respiratory failure. Tracheostomy. EXAM: PORTABLE CHEST 1 VIEW COMPARISON:  Portable chest 11/22/2019 and earlier. FINDINGS: Portable AP semi upright view at 0531 hours. Tracheostomy tube has been exchanged but placement appears stable. Stable right subclavian central  line. Visible enteric tube and feeding tube are stable. Cardiomegaly with veiling and confluent perihilar and lower lung opacity. No superimposed pneumothorax. Small component of pleural fluid suspected. Pulmonary vascularity has mildly decreased since yesterday. Stable visualized osseous structures. IMPRESSION: 1. Tracheostomy tube exchanged but position is stable. Otherwise stable lines and tubes. 2. Cardiomegaly with combination airspace opacity, lower lobe collapse, and probable small effusions stable since yesterday. No overt edema. Electronically Signed   By: Genevie Ann M.D.   On: 11/25/2019 08:53   DG Abd Portable 1V  Result Date: 11/25/2019 CLINICAL DATA:  53 year old male with respiratory failure and ileus. NG tube placement. EXAM: PORTABLE ABDOMEN - 1 VIEW COMPARISON:  0655 hours today. FINDINGS: Portable AP upright view at 1241 hours. The NG type tube has been advanced, and the side hole now projects at the level of the gastric cardia. The tip of the NG tube projects near the tip of the feeding tube. Partially visible right femoral and subclavian lines appear stable. Stable to slightly decreased gaseous distension of bowel from earlier today. Visible lung bases demonstrate cardiomegaly with bilateral retrocardiac lower lobe air bronchograms. Small if any effusions. IMPRESSION: 1. Satisfactory advancement of the enteric tube, side hole now at the level of the gastric cardia. Stable feeding tube. 2. Stable to slightly decreased gas distended bowel in the abdomen from this morning. 3. Lower lobe collapse or consolidation with small if any effusions visible at the lung bases. Electronically Signed   By: Genevie Ann M.D.   On: 11/25/2019 12:53   DG Abd Portable 1V  Result Date: 11/25/2019 CLINICAL DATA:  53 year old male with respiratory failure, ileus. EXAM: PORTABLE ABDOMEN - 1 VIEW COMPARISON:  Portable chest today reported separately. Abdominal films 11/23/2019. FINDINGS: Portable AP supine view at 0655  hours. Relative positions of enteric feeding tube and NG type tube tips are stable from 11/23/2019, and at that time the NG tube side hole was at the level of the distal esophagus. Feeding tube tip at the level of the lateral fundus. Persistent gas-filled bowel loops throughout the abdomen although the transverse colon appears less distended now. Probable gas-filled cecum is stable in the right lower quadrant. Distal gas is present to the junction of the sigmoid and descending colon. There are bilateral femoral approach vascular catheters in place now. Stable visualized osseous structures. IMPRESSION: 1. Stable enteric feeding tube tip at the gastric fundus. NG type tube  side hole is likely in the distal esophagus or at the GE J. consider advancing the NG tube 6 cm to ensure side hole placement within the stomach. 2. Bowel-gas pattern compatible with ileus appears mildly improved since 11/23/2019. Electronically Signed   By: Genevie Ann M.D.   On: 11/25/2019 08:56    Cardiac Studies   2D Doppler echocardiogram 11/17/2019 IMPRESSIONS    1. Left ventricular ejection fraction, by visual estimation, is <20%. The left ventricle has severely decreased function. Left ventricular septal wall thickness was mildly increased. Mildly increased left ventricular posterior wall thickness. Cannot  rule out early forming mural thrombus at the apex.  2. Definity contrast agent was given IV to delineate the left ventricular endocardial borders.  3. Left ventricular diastolic function could not be evaluated due to atrial fibrillation.  4. Global right ventricle has normal systolic function.The right ventricular size is normal. No increase in right ventricular wall thickness.  5. Left atrial size was severely dilated.  6. Right atrial size was severely dilated.  7. Moderate to severe mitral annular calcification.  8. The mitral valve is normal in structure. Trace mitral valve regurgitation. No evidence of mitral  stenosis.  9. The tricuspid valve is normal in structure. Tricuspid valve regurgitation is trivial. 10. The aortic valve is tricuspid. Aortic valve regurgitation is not visualized. Mild aortic valve sclerosis without stenosis. 11. The pulmonic valve was normal in structure. Pulmonic valve regurgitation is not visualized.  Patient Profile     53 y.o. 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 Lynetta Mare.  Assessment & Plan    1. PEA arrest - EP now following 2. New onset AF - amio will help prevent recurrence. 3. Chronic combined systolic HF- Volume control by dialysis. Hemodynamics prevent guideline dirested therapy 4. ESRD - continue 5. Encephalopathy -slightly improving.   Not much to offer from cardiac standpoint.  This is an unfortunate situation.  For questions or updates, please contact [place] Please consult [email or web address] for contact info under        Signed, Sinclair Grooms, MD  11/26/2019, 10:11 AM

## 2019-11-26 NOTE — Progress Notes (Signed)
NAME:  [name] [name], MRN:  [id number], DOB:  05-10-1966, LOS: 53 ADMISSION DATE:  11/16/2019, CONSULTATION DATE:  12/2 REFERRING MD:  Dr. Sedonia Small, CHIEF COMPLAINT:  Post cardiac arrest    Brief History   53yo male presented post PEA arrest which occurred during iHD treatment as outpatient. This was a witnessed arrest with quick bystander CPR. Received 3 rounds of epi, 1g of calcium and 50 mEq of bicarb with ROSC after 15 minutes.   Emergent cricothyrotomy performed by EMS due to failed intubation with [facility] airway.   Past Medical History  NSVT 2017 Nonischemic cardiomyopathy Medical noncompliance Polysubstance abuse Hypertension End-stage renal disease Combined systolic and diastolic congestive heart failure Cocaine abuse Alcohol abuse Prolonged QTC  Significant Hospital Events   12/2 admitted post cardiac arrest, emergent cric, 33 C TTM 12/8-V. tach arrest 12/10 Cric changed to tracheostomy  Consults:  Nephrology Electrophysiology ENT  Procedures:  12/2 remeasuring cricothyrotomy per EMS  Significant Diagnostic Tests:  CTA 12/2-segmental filling defects in the right middle lobe and right lower lobe pulmonary arteries, pneumomediastinum, subcu emphysema CT head 12/8-no acute abnormality  Echocardiogram 11/17/19-LVEF less than 20%, normal LV systolic function  85/63 KUB: Bowel gas pattern c/w ileus, mildly improved from prior imaging.   Micro Data:  COVID 12/2 > negative Aspirate 12/04+ for Haemophilus influenza all/2 blood cultures no growth to date  Antimicrobials:  Unasyn 12/4>>12/7 Ceftriazone 12/7>>  Interim history/subjective:  NG tube continues to do intermittent suction.  No acute events overnight Levophed is weaning down slowly. Has intermittent hiccups  Objective   Blood pressure 105/70, pulse 67, temperature 98.9 F (37.2 C), temperature source Oral, resp. rate (!) 29, height 5' 10.5" (1.791 m), weight 69.1 kg, SpO2 100 %. CVP:  [13 mmHg-17 mmHg] 14  mmHg  Vent Mode: PRVC FiO2 (%):  [40 %] 40 % Set Rate:  [28 bmp] 28 bmp Vt Set:  [440 mL] 440 mL PEEP:  [5 cmH20] 5 cmH20 Plateau Pressure:  [15 cmH20-21 cmH20] 15 cmH20   Intake/Output Summary (Last 24 hours) at 11/26/2019 0913 Last data filed at 11/26/2019 0300 Gross per 24 hour  Intake 1146.12 ml  Output 410 ml  Net 736.12 ml   Filed Weights   11/24/19 0500 11/25/19 0415 11/26/19 0500  Weight: 70.8 kg 69.9 kg 69.1 kg    Examination: Gen:      No acute distress HEENT:  EOMI, sclera anicteric Neck:     No masses; no thyromegaly, ET tube Lungs:    Clear to auscultation bilaterally; normal respiratory effort CV:         Regular rate and rhythm; no murmurs Abd:   Improved abdominal distention, positive bowel sounds Ext:    No edema; adequate peripheral perfusion Skin:      Warm and dry; no rash Neuro: Awake, responsive  Lines:   L fem a line, R [place] CVC.   Resolved Hospital Problem list     Assessment & Plan:   Cardiac arrest, with tachycardia Possibly PE but filling defects may be artifactual. Combined systolic and diastolic CHF, NSVT, prolonged QT, and polysubstance abuse  Cardiogenic shock, LVEF less than 20% P: -MAP goal > 65. NE for goal. Wean as able  -Heparin for possible PE   Acute hypoxic respiratory failure, now s/p tracheostomy  Emergent cricothyrotomy   -In the setting of post cardiac arrest  P: -Goal is wean to trach collar -Continue pulm hygiene   New onset A-fib HX NSVT/V. fib arrest HX Prolonged QT Chronic  combined systolic and diastolic congestive heart failure Nonischemic cardiomyopathy  -ECHO 11/10/18 with EF of 25-30% and grade 2 diastolicdysfunction  -Right and left heart cath 02/25/2016 with no angiographic evidence of CAD P: -Appreciate cardiology involvement -Continue amio and heparin -Continue ICU monitoring  Acute encephalopathy Has been responsive per nursing P -Delirium precautions   ESRD  -Undergoing iHD at time of  arrest, assuming regular schedule is MWF P: -nephrology following, off CRRT  -Removed femoral trialysis cath  -Continue midodrine  Polysubstance abuse  -Hx of cocaine, alcohol, and tobacco abuse P: -PAD protocol -Will need substance use cessation counseling prior to discharge   Ileus Hiccups n/v P: NG tube Intermittent suction Abdominal exam is better today.  We will resume tube feeds tomorrow after review of repeat x-ray  Hypocalcemia Replacing 12/11  Best practice:  Diet: Hold EN  Pain/Anxiety/Delirium protocol (if indicated): VAP protocol (if indicated): yes, ordered DVT prophylaxis: heparin gtt GI prophylaxis: PPI Glucose control: yes less than 180 Foley: none Mobility: bed rest Code Status: Full Family Communication: Pending 12/12  Disposition: needs ICU  Critical care time:   The patient is critically ill with multiple organ system failure and requires high complexity decision making for assessment and support, frequent evaluation and titration of therapies, advanced monitoring, review of radiographic studies and interpretation of complex data.   Critical Care Time devoted to patient care services, exclusive of separately billable procedures, described in this note is 35 minutes.   Marshell Garfinkel MD [facility] 11/26/2019, 9:13 AM

## 2019-11-26 NOTE — Progress Notes (Signed)
Progress Note  Patient Name: Blake Vetrano Date of Encounter: 11/26/2019  Primary Cardiologist: Fransico Him, MD   Subjective   Remains awake with trach tube in place  Inpatient Medications    Scheduled Meds: . B-complex with vitamin C  1 tablet Per Tube Daily  . chlorhexidine gluconate (MEDLINE KIT)  15 mL Mouth Rinse BID  . Chlorhexidine Gluconate Cloth  6 each Topical Daily  . docusate  100 mg Per Tube Daily  . feeding supplement (PRO-STAT SUGAR FREE 64)  30 mL Per Tube TID  . mouth rinse  15 mL Mouth Rinse 10 times per day  . midodrine  10 mg Per Tube Q8H  . oxyCODONE  5 mg Oral Q6H  . pantoprazole sodium  40 mg Per Tube QHS  . sodium chloride flush  10-40 mL Intracatheter Q12H  . white petrolatum   Topical Daily   Continuous Infusions: . sodium chloride Stopped (11/22/19 1656)  . amiodarone 30 mg/hr (11/26/19 0300)  . cefTRIAXone (ROCEPHIN)  IV Stopped (11/25/19 2012)  . dexmedetomidine (PRECEDEX) IV infusion 0.8 mcg/kg/hr (11/25/19 1400)  . dextrose Stopped (11/22/19 1400)  . feeding supplement (VITAL 1.5 CAL) Stopped (11/23/19 1950)  . heparin 1,500 Units/hr (11/26/19 0300)  . norepinephrine (LEVOPHED) Adult infusion 16 mcg/min (11/26/19 0300)   PRN Meds: sodium chloride, acetaminophen (TYLENOL) oral liquid 160 mg/5 mL, acetaminophen, fentaNYL (SUBLIMAZE) injection, midazolam, prochlorperazine, sodium chloride flush   Vital Signs    Vitals:   11/26/19 0630 11/26/19 0700 11/26/19 0736 11/26/19 0807  BP: 102/67 105/70    Pulse: 67 69 67   Resp: (!) 22 (!) 29 (!) 29   Temp:    98.9 F (37.2 C)  TempSrc:    Oral  SpO2: 100% 100% 100%   Weight:      Height:        Intake/Output Summary (Last 24 hours) at 11/26/2019 1031 Last data filed at 11/26/2019 0300 Gross per 24 hour  Intake 1068.78 ml  Output 300 ml  Net 768.78 ml   Filed Weights   11/24/19 0500 11/25/19 0415 11/26/19 0500  Weight: 70.8 kg 69.9 kg 69.1 kg    Telemetry    NSR -  Personally Reviewed  ECG    none - Personally Reviewed  Physical Exam   GEN: No acute distress.   Neck: No JVD, trach tube in place Cardiac: RRR, no murmurs, rubs, or gallops.  Respiratory: Clear to auscultation bilaterally. GI: Soft, nontender, non-distended  MS: No edema; No deformity. Neuro:  Nonfocal  Psych: Normal affect   Labs    Chemistry Recent Labs  Lab 11/24/19 0324 11/24/19 1632 11/25/19 0359 11/25/19 0408 11/26/19 0406  NA 135 136 137 135 136  K 4.3 4.2 4.1 4.0 3.5  CL 102 102 101  --  102  CO2 21* 20* 21*  --  20*  GLUCOSE 116* 106* 117*  --  122*  BUN 25* 23* 21*  --  39*  CREATININE 1.86* 1.84* 1.71*  --  3.96*  CALCIUM 7.9* 7.7* 8.0*  --  7.8*  ALBUMIN 2.3* 2.1* 2.3*  --   --   GFRNONAA 41* 42* 45*  --  16*  GFRAA 47* 48* 53*  --  19*  ANIONGAP 12 14 15   --  14     Hematology Recent Labs  Lab 11/24/19 0324 11/25/19 0359 11/25/19 0408 11/26/19 0406  WBC 19.0* 22.5*  --  20.2*  RBC 3.73* 3.79*  --  3.30*  HGB 11.2*  11.3* 12.9* 10.0*  HCT 32.7* 32.7* 38.0* 28.1*  MCV 87.7 86.3  --  85.2  MCH 30.0 29.8  --  30.3  MCHC 34.3 34.6  --  35.6  RDW 16.4* 16.8*  --  16.8*  PLT 164 189  --  161    Cardiac EnzymesNo results for input(s): TROPONINI in the last 168 hours. No results for input(s): TROPIPOC in the last 168 hours.   BNPNo results for input(s): BNP, PROBNP in the last 168 hours.   DDimer No results for input(s): DDIMER in the last 168 hours.   Radiology    DG Chest Port 1 View  Result Date: 11/25/2019 CLINICAL DATA:  53 year old male with respiratory failure. Tracheostomy. EXAM: PORTABLE CHEST 1 VIEW COMPARISON:  Portable chest 11/22/2019 and earlier. FINDINGS: Portable AP semi upright view at 0531 hours. Tracheostomy tube has been exchanged but placement appears stable. Stable right subclavian central line. Visible enteric tube and feeding tube are stable. Cardiomegaly with veiling and confluent perihilar and lower lung opacity. No  superimposed pneumothorax. Small component of pleural fluid suspected. Pulmonary vascularity has mildly decreased since yesterday. Stable visualized osseous structures. IMPRESSION: 1. Tracheostomy tube exchanged but position is stable. Otherwise stable lines and tubes. 2. Cardiomegaly with combination airspace opacity, lower lobe collapse, and probable small effusions stable since yesterday. No overt edema. Electronically Signed   By: Genevie Ann M.D.   On: 11/25/2019 08:53   DG Abd Portable 1V  Result Date: 11/25/2019 CLINICAL DATA:  53 year old male with respiratory failure and ileus. NG tube placement. EXAM: PORTABLE ABDOMEN - 1 VIEW COMPARISON:  0655 hours today. FINDINGS: Portable AP upright view at 1241 hours. The NG type tube has been advanced, and the side hole now projects at the level of the gastric cardia. The tip of the NG tube projects near the tip of the feeding tube. Partially visible right femoral and subclavian lines appear stable. Stable to slightly decreased gaseous distension of bowel from earlier today. Visible lung bases demonstrate cardiomegaly with bilateral retrocardiac lower lobe air bronchograms. Small if any effusions. IMPRESSION: 1. Satisfactory advancement of the enteric tube, side hole now at the level of the gastric cardia. Stable feeding tube. 2. Stable to slightly decreased gas distended bowel in the abdomen from this morning. 3. Lower lobe collapse or consolidation with small if any effusions visible at the lung bases. Electronically Signed   By: Genevie Ann M.D.   On: 11/25/2019 12:53   DG Abd Portable 1V  Result Date: 11/25/2019 CLINICAL DATA:  53 year old male with respiratory failure, ileus. EXAM: PORTABLE ABDOMEN - 1 VIEW COMPARISON:  Portable chest today reported separately. Abdominal films 11/23/2019. FINDINGS: Portable AP supine view at 0655 hours. Relative positions of enteric feeding tube and NG type tube tips are stable from 11/23/2019, and at that time the NG tube  side hole was at the level of the distal esophagus. Feeding tube tip at the level of the lateral fundus. Persistent gas-filled bowel loops throughout the abdomen although the transverse colon appears less distended now. Probable gas-filled cecum is stable in the right lower quadrant. Distal gas is present to the junction of the sigmoid and descending colon. There are bilateral femoral approach vascular catheters in place now. Stable visualized osseous structures. IMPRESSION: 1. Stable enteric feeding tube tip at the gastric fundus. NG type tube side hole is likely in the distal esophagus or at the GE J. consider advancing the NG tube 6 cm to ensure side hole placement within the  stomach. 2. Bowel-gas pattern compatible with ileus appears mildly improved since 11/23/2019. Electronically Signed   By: Genevie Ann M.D.   On: 11/25/2019 08:56    Cardiac Studies   none  Patient Profile     53 y.o. male admitted with PEA arrest but also VT in the hospital, s/p trach  Assessment & Plan    1. VT arrest - he will continue his iv amiodarone. No recurrent VT 2. New onset atrial fib - he will continue amiodarone 3. ESRD - on HD. Continue 4. Anoxic encephalopathy - he appears to be awake and alert.  5. Tracheostomy - this will limit our treatment options to prevent VT. I would suggest a Life vest at DC along with amiodarone.      For questions or updates, please contact [place] Please consult [email or web address] for contact info under Cardiology/STEMI.      Signed, Cristopher Peru, MD  11/26/2019, 10:31 AM  Patient ID: [name], male   DOB: 1966/09/17, 53 y.o.   MRN: [id number]

## 2019-11-27 ENCOUNTER — Inpatient Hospital Stay (HOSPITAL_COMMUNITY): Payer: Medicare Other

## 2019-11-27 LAB — GLUCOSE, CAPILLARY
Glucose-Capillary: 116 mg/dL — ABNORMAL HIGH (ref 70–99)
Glucose-Capillary: 105 mg/dL — ABNORMAL HIGH (ref 70–99)
Glucose-Capillary: 111 mg/dL — ABNORMAL HIGH (ref 70–99)
Glucose-Capillary: 110 mg/dL — ABNORMAL HIGH (ref 70–99)
Glucose-Capillary: 111 mg/dL — ABNORMAL HIGH (ref 70–99)

## 2019-11-27 LAB — MAGNESIUM: Magnesium: 2.8 mg/dL — ABNORMAL HIGH (ref 1.7–2.4)

## 2019-11-27 LAB — BASIC METABOLIC PANEL
Anion gap: 17 — ABNORMAL HIGH (ref 5–15)
BUN: 62 mg/dL — ABNORMAL HIGH (ref 6–20)
CO2: 18 mmol/L — ABNORMAL LOW (ref 22–32)
Calcium: 7.8 mg/dL — ABNORMAL LOW (ref 8.9–10.3)
Chloride: 98 mmol/L (ref 98–111)
Creatinine, Ser: 5.25 mg/dL — ABNORMAL HIGH (ref 0.61–1.24)
GFR calc Af Amer: 14 mL/min — ABNORMAL LOW (ref >60)
GFR calc non Af Amer: 12 mL/min — ABNORMAL LOW (ref >60)
Glucose, Bld: 112 mg/dL — ABNORMAL HIGH (ref 70–99)
Potassium: 3.8 mmol/L (ref 3.5–5.1)
Sodium: 133 mmol/L — ABNORMAL LOW (ref 135–145)

## 2019-11-27 LAB — CBC WITH DIFFERENTIAL/PLATELET
Abs Immature Granulocytes: 0.57 10*3/uL — ABNORMAL HIGH (ref 0.00–0.07)
Basophils Absolute: 0.1 10*3/uL (ref 0.0–0.1)
Basophils Relative: 0 %
Eosinophils Absolute: 0.2 10*3/uL (ref 0.0–0.5)
Eosinophils Relative: 1 %
HCT: 28 % — ABNORMAL LOW (ref 39.0–52.0)
Hemoglobin: 9.8 g/dL — ABNORMAL LOW (ref 13.0–17.0)
Immature Granulocytes: 3 %
Lymphocytes Relative: 7 %
Lymphs Abs: 1.3 10*3/uL (ref 0.7–4.0)
MCH: 29.8 pg (ref 26.0–34.0)
MCHC: 35 g/dL (ref 30.0–36.0)
MCV: 85.1 fL (ref 80.0–100.0)
Monocytes Absolute: 1.5 10*3/uL — ABNORMAL HIGH (ref 0.1–1.0)
Monocytes Relative: 8 %
Neutro Abs: 16 10*3/uL — ABNORMAL HIGH (ref 1.7–7.7)
Neutrophils Relative %: 81 %
Platelets: 195 10*3/uL (ref 150–400)
RBC: 3.29 MIL/uL — ABNORMAL LOW (ref 4.22–5.81)
RDW: 17.3 % — ABNORMAL HIGH (ref 11.5–15.5)
WBC: 19.6 10*3/uL — ABNORMAL HIGH (ref 4.0–10.5)
nRBC: 0.4 % — ABNORMAL HIGH (ref 0.0–0.2)

## 2019-11-27 LAB — HEPARIN LEVEL (UNFRACTIONATED)
Heparin Unfractionated: 0.23 IU/mL — ABNORMAL LOW (ref 0.30–0.70)
Heparin Unfractionated: 0.36 IU/mL (ref 0.30–0.70)

## 2019-11-27 LAB — PHOSPHORUS: Phosphorus: 3.7 mg/dL (ref 2.5–4.6)

## 2019-11-27 MED ORDER — CHLORHEXIDINE GLUCONATE CLOTH 2 % EX PADS
6.0000 | MEDICATED_PAD | Freq: Every day | CUTANEOUS | Status: DC
  Administered 2019-11-28 – 2019-12-05 (×4): 6 via TOPICAL

## 2019-11-27 NOTE — Progress Notes (Signed)
Per discussion w/ CCM, no ATC wean trials today, but okay for pt to wean on PSV as tol.

## 2019-11-27 NOTE — Progress Notes (Addendum)
NAME:  [name] [name], MRN:  [id number], DOB:  1966/11/27, LOS: 53 ADMISSION DATE:  11/16/2019, CONSULTATION DATE:  12/2 REFERRING MD:  Dr. Sedonia Small, CHIEF COMPLAINT:  Post cardiac arrest    Brief History   53yo male presented post PEA arrest which occurred during iHD treatment as outpatient. This was a witnessed arrest with quick bystander CPR. Received 3 rounds of epi, 1g of calcium and 50 mEq of bicarb with ROSC after 15 minutes.   Emergent cricothyrotomy performed by EMS due to failed intubation with [facility] airway.   Past Medical History  NSVT 2017 Nonischemic cardiomyopathy Medical noncompliance Polysubstance abuse Hypertension End-stage renal disease Combined systolic and diastolic congestive heart failure Cocaine abuse Alcohol abuse Prolonged QTC  Significant Hospital Events   12/2 Admitted post cardiac arrest, emergent cric, 33 C TTM 12/8 V. tach arrest 12/10 Cric changed to tracheostomy  Consults:  Nephrology Electrophysiology ENT  Procedures:  12/10-trach  Significant Diagnostic Tests:  CTA 12/2-segmental filling defects in the right middle lobe and right lower lobe pulmonary arteries, pneumomediastinum, subcu emphysema CT head 12/8-no acute abnormality  Echocardiogram 11/17/19-LVEF less than 20%, normal LV systolic function  11/91 KUB: Bowel gas pattern c/w ileus, mildly improved from prior imaging.   Micro Data:  COVID 12/2 > negative Aspirate 12/04+ for Haemophilus influenza all/2 blood cultures no growth to date  Antimicrobials:  Unasyn 12/4>>12/7 Ceftriazone 12/7>>  Interim history/subjective:  No acute events Levophed is weaning down Awake, give a thumbs up when asked how he is doing  Objective   Blood pressure 99/64, pulse 68, temperature 98.2 F (36.8 C), temperature source Oral, resp. rate 16, height 5' 10.5" (1.791 m), weight 71.6 kg, SpO2 93 %. CVP:  [5 mmHg-37 mmHg] 11 mmHg  Vent Mode: PRVC FiO2 (%):  [40 %] 40 % Set Rate:  [28 bmp] 28  bmp Vt Set:  [440 mL] 440 mL PEEP:  [5 cmH20] 5 cmH20 Plateau Pressure:  [16 cmH20-31 cmH20] 16 cmH20   Intake/Output Summary (Last 24 hours) at 11/27/2019 0902 Last data filed at 11/27/2019 0600 Gross per 24 hour  Intake 1046.81 ml  Output --  Net 1046.81 ml   Filed Weights   11/25/19 0415 11/26/19 0500 11/27/19 0500  Weight: 69.9 kg 69.1 kg 71.6 kg    Examination: Gen:      No acute distress HEENT:  EOMI, sclera anicteric Neck:     No masses; no thyromegaly Lungs:    Clear to auscultation bilaterally; normal respiratory effort CV:         Regular rate and rhythm; no murmurs Abd:      Mild abd distention, + BS Ext:    No edema; adequate peripheral perfusion Skin:      Warm and dry; no rash Neuro: Awake, responsive  Lines:   L fem a line, R [place] CVC.   Resolved Hospital Problem list     Assessment & Plan:   Cardiac arrest, with tachycardia Possibly PE but filling defects may be artifactual. Combined systolic and diastolic CHF, NSVT, prolonged QT, and polysubstance abuse  Cardiogenic shock, LVEF less than 20% P: -MAP goal > 65. NE for goal. Wean as able  -Heparin for possible PE   Acute hypoxic respiratory failure, now s/p tracheostomy  Emergent cricothyrotomy   -In the setting of post cardiac arrest  P: -PSV weans as tolerated -Continue pulm hygiene   New onset A-fib HX NSVT/V. fib arrest HX Prolonged QT Chronic combined systolic and diastolic congestive heart failure Nonischemic cardiomyopathy  -  ECHO 11/10/18 with EF of 25-30% and grade 2 diastolicdysfunction  -Right and left heart cath 02/25/2016 with no angiographic evidence of CAD P: -Appreciate cardiology involvement -Continue amio and heparin -Continue ICU monitoring -DC A-line and get out of bed  ESRD  -Undergoing iHD at time of arrest, assuming regular schedule is MWF P: -nephrology following, off CRRT  -Removed femoral trialysis cath  -Continue midodrine  Polysubstance abuse  -Hx of  cocaine, alcohol, and tobacco abuse P: -PAD protocol -Will need substance use cessation counseling prior to discharge   Ileus Hiccups n/v P: NG tube Intermittent suction Abdominal exam is better today.  We will resume tube feeds after review of repeat x-ray  Best practice:  Diet: Hold EN  Pain/Anxiety/Delirium protocol (if indicated): VAP protocol (if indicated): yes, ordered DVT prophylaxis: heparin gtt GI prophylaxis: PPI Glucose control: yes less than 180 Foley: none Mobility: bed rest Code Status: Full Family Communication: Pending 12/12  Disposition: needs ICU  Critical care time:   The patient is critically ill with multiple organ system failure and requires high complexity decision making for assessment and support, frequent evaluation and titration of therapies, advanced monitoring, review of radiographic studies and interpretation of complex data.   Critical Care Time devoted to patient care services, exclusive of separately billable procedures, described in this note is 35 minutes.   Marshell Garfinkel MD  Pulmonary and Critical Care 11/27/2019, 9:02 AM

## 2019-11-27 NOTE — Progress Notes (Signed)
Progress Note  Patient Name: [name] [name] Date of Encounter: 11/27/2019  Primary Cardiologist: Fransico Him, MD   Subjective   Remains intubated, sedated and hypotensive  Inpatient Medications    Scheduled Meds: . B-complex with vitamin C  1 tablet Per Tube Daily  . chlorhexidine gluconate (MEDLINE KIT)  15 mL Mouth Rinse BID  . Chlorhexidine Gluconate Cloth  6 each Topical Daily  . docusate  100 mg Per Tube Daily  . feeding supplement (PRO-STAT SUGAR FREE 64)  30 mL Per Tube TID  . mouth rinse  15 mL Mouth Rinse 10 times per day  . midodrine  10 mg Per Tube Q8H  . oxyCODONE  5 mg Oral Q6H  . pantoprazole sodium  40 mg Per Tube QHS  . sodium chloride flush  10-40 mL Intracatheter Q12H  . white petrolatum   Topical Daily   Continuous Infusions: . sodium chloride Stopped (11/22/19 1656)  . amiodarone 30 mg/hr (11/27/19 0600)  . cefTRIAXone (ROCEPHIN)  IV Stopped (11/26/19 1834)  . dexmedetomidine (PRECEDEX) IV infusion 0.8 mcg/kg/hr (11/25/19 1400)  . dextrose Stopped (11/22/19 1400)  . feeding supplement (VITAL 1.5 CAL) Stopped (11/23/19 1950)  . heparin 1,600 Units/hr (11/27/19 0600)  . norepinephrine (LEVOPHED) Adult infusion 20 mcg/min (11/27/19 0718)   PRN Meds: sodium chloride, acetaminophen (TYLENOL) oral liquid 160 mg/5 mL, acetaminophen, fentaNYL (SUBLIMAZE) injection, midazolam, prochlorperazine, sodium chloride flush   Vital Signs    Vitals:   11/27/19 0600 11/27/19 0700 11/27/19 0822 11/27/19 0824  BP: 102/69 99/64    Pulse: 70 68    Resp: 14 16    Temp:   98.2 F (36.8 C)   TempSrc:   Oral   SpO2: 99% 98%  93%  Weight:      Height:        Intake/Output Summary (Last 24 hours) at 11/27/2019 0858 Last data filed at 11/27/2019 0600 Gross per 24 hour  Intake 1091.54 ml  Output -  Net 1091.54 ml   Filed Weights   11/25/19 0415 11/26/19 0500 11/27/19 0500  Weight: 69.9 kg 69.1 kg 71.6 kg    Telemetry    nsr - Personally Reviewed  ECG    none - Personally Reviewed  Physical Exam   GEN: No acute distress.   Neck: trach tube in place Cardiac: RRR, no murmurs, rubs, or gallops.  Respiratory: Clear to auscultation bilaterally. GI: Soft, nontender, non-distended  MS: No edema; No deformity. Neuro:  Nonfocal but sedated Psych: unable to assess  Labs    Chemistry Recent Labs  Lab 11/24/19 0324 11/24/19 1632 11/25/19 0359 11/25/19 0408 11/26/19 0406 11/27/19 0341  NA 135 136 137 135 136 133*  K 4.3 4.2 4.1 4.0 3.5 3.8  CL 102 102 101  --  102 98  CO2 21* 20* 21*  --  20* 18*  GLUCOSE 116* 106* 117*  --  122* 112*  BUN 25* 23* 21*  --  39* 62*  CREATININE 1.86* 1.84* 1.71*  --  3.96* 5.25*  CALCIUM 7.9* 7.7* 8.0*  --  7.8* 7.8*  ALBUMIN 2.3* 2.1* 2.3*  --   --   --   GFRNONAA 41* 42* 45*  --  16* 12*  GFRAA 47* 48* 53*  --  19* 14*  ANIONGAP _0 --  14 17*     Hematology Recent Labs  Lab 11/25/19 0359 11/25/19 0408 11/26/19 0406 11/27/19 0341  WBC 22.5*  --  20.2* 19.6*  RBC 3.79*  --  3.30* 3.29*  HGB 11.3* 12.9* 10.0* 9.8*  HCT 32.7* 38.0* 28.1* 28.0*  MCV 86.3  --  85.2 85.1  MCH 29.8  --  30.3 29.8  MCHC 34.6  --  35.6 35.0  RDW 16.8*  --  16.8* 17.3*  PLT 189  --  161 195    Cardiac EnzymesNo results for input(s): TROPONINI in the last 168 hours. No results for input(s): TROPIPOC in the last 168 hours.   BNPNo results for input(s): BNP, PROBNP in the last 168 hours.   DDimer No results for input(s): DDIMER in the last 168 hours.   Radiology    DG Abd Portable 1V  Result Date: 11/25/2019 CLINICAL DATA:  53 year old male with respiratory failure and ileus. NG tube placement. EXAM: PORTABLE ABDOMEN - 1 VIEW COMPARISON:  0655 hours today. FINDINGS: Portable AP upright view at 1241 hours. The NG type tube has been advanced, and the side hole now projects at the level of the gastric cardia. The tip of the NG tube projects near the tip of the feeding tube. Partially visible right  femoral and subclavian lines appear stable. Stable to slightly decreased gaseous distension of bowel from earlier today. Visible lung bases demonstrate cardiomegaly with bilateral retrocardiac lower lobe air bronchograms. Small if any effusions. IMPRESSION: 1. Satisfactory advancement of the enteric tube, side hole now at the level of the gastric cardia. Stable feeding tube. 2. Stable to slightly decreased gas distended bowel in the abdomen from this morning. 3. Lower lobe collapse or consolidation with small if any effusions visible at the lung bases. Electronically Signed   By: Genevie Ann M.D.   On: 11/25/2019 12:53    Cardiac Studies   none  Patient Profile     53 y.o. male admitted with PEA arrest then with VT arrest in the hospital, with VDRF/s/p trach, with ES renal failure   Assessment & Plan    1. VT arrest  - he will continue amiodarone. No additional rec's. He will need a life vest if he survives to DC. Not currently an ICD candidate due to comorbidities. 2. PAF - he will continue amiodarone.      For questions or updates, please contact [place] Please consult [email or web address] for contact info under Cardiology/STEMI.      Signed, Cristopher Peru, MD  11/27/2019, 8:58 AM  Patient ID: [name], male   DOB: 1966-07-20, 53 y.o.   MRN: [id number]

## 2019-11-27 NOTE — Progress Notes (Signed)
[facility] ROUNDING NOTE   Subjective:   Is a 53 year old gentleman history of SVT systolic heart failure polysubstance abuse end-stage renal disease Monday Wednesday Friday dialysis [facility] status post PEA arrest 11/16/2019.  Occurred about 3-1/2 hours into hemodialysis treatment received 3 rounds of epinephrine 1 g of calcium 50 mEq of bicarbonate with ROSC of about 15 minutes.  Emergent cricothyroitomy performed by EMS due to failed intubation.  CRRT initiated 11/18/2019.   Daily Update:  seen in ICU, still on pressors   Objective:  Vital signs in last 24 hours:  Temp:  [97.8 F (36.6 C)-99.1 F (37.3 C)] 97.8 F (36.6 C) (12/13 1154) Pulse Rate:  [65-101] 101 (12/13 1000) Resp:  [14-33] 17 (12/13 1000) BP: (93-125)/(51-100) 102/80 (12/13 1000) SpO2:  [83 %-100 %] 100 % (12/13 1100) Arterial Line BP: (103-169)/(45-86) 111/53 (12/13 1000) FiO2 (%):  [40 %] 40 % (12/13 1100) Weight:  [71.6 kg] 71.6 kg (12/13 0500)  Weight change: 2.5 kg Filed Weights   11/25/19 0415 11/26/19 0500 11/27/19 0500  Weight: 69.9 kg 69.1 kg 71.6 kg    Intake/Output: I/O last 3 completed shifts: In: 1898.4 [I.V.:1608.4; NG/GT:90; IV Piggyback:200.1] Out: 100 [Emesis/NG output:100]   Intake/Output this shift:  Total I/O In: 430.7 [P.O.:110; I.V.:250.7; NG/GT:70] Out: 0   Exam Trach, awakens easily, on vent 40% LUE AVF +B/T Brady, nl s1s2 Coarse bs b/l S/mild distension Mild LE edema Temp cath removed  HD [facility]: [place] MWF  4h 32mn  74.5kg  2/2.5 bath  340/A1.5   P4  Hep 5000     C3 0.75 qTx     no ESA/Fe     Hb 12s usually  CXR 12/8 - no chg bilat layering effusion and IS/ alveloar infiltrates   Assessment:   ESRD- MWF HD.  Started on CRRT 11/19/2019.  [place] Plan HD Monday as tol in ICU.   BP/Volume - remains below dry wt, no vol excess on exam. Midodrine to 10 tid. Weaning pressors.  At OP HD pt's BP's are normal on coreg and Imdur  home meds  ID - WBC up so temp HD cath removed on Friday  Status post arrest x 2- PEA at OP HD on 12/2, 174m until ROSC.  ECHO LV = 20% ejection fraction. Seen by EP team. Then VT arrest on 12/8, recovered.   New onset atrial fibrillation CHA2DS2-VASc score 3 - per primary   Chronic combined congestive heart failure:  EF was 30% , now < 20% not a candidate for ICD due to noncompliance and drug abuse  Prolonged QT   Cardiogenic shock: remains on pressors  ANEMIA-no major issue  MBD-severe chronic hyperphosphatemia  ACCESS-AV fistula  Diffuse nonspecific encephalopathy EEG performed   Emergent cricothyroidotomy / acute respiratory failure- per CCM  History of polysubstance abuse  CT scan of chest with suspicion for pulmonary embolus - IV heparin  RoKelly SplinterMD 11/24/2019, 1:17 PM      Basic Metabolic Panel: Recent Labs  Lab 11/22/19 0408 11/22/19 0425 11/23/19 0300 11/23/19 1514 11/24/19 0324 11/24/19 1632 11/25/19 0359 11/25/19 0408 11/26/19 0406 11/27/19 0341  NA 135  --  133* 134* 135 136 137 135 136 133*  K 4.3  --  4.8 4.3 4.3 4.2 4.1 4.0 3.5 3.8  CL 101   < > 100 103 102 102 101  --  102 98  CO2 22   < > 17* 16* 21* 20* 21*  --  20* 18*  GLUCOSE 109*   < > 89 197* 116* 106* 117*  --  122* 112*  BUN 15   < > 22* 24* 25* 23* 21*  --  39* 62*  CREATININE 1.70*   < > 2.17* 1.93* 1.86* 1.84* 1.71*  --  3.96* 5.25*  CALCIUM 8.3*   < > 7.8* 7.5* 7.9* 7.7* 8.0*  --  7.8* 7.8*  MG 2.6*  --  2.5*  --  2.5*  --  2.5*  --   --  2.8*  PHOS 1.2*   < > 3.3 2.6 1.5* 1.8* 3.0  --   --  3.7   < > = values in this interval not displayed.    Liver Function Tests: Recent Labs  Lab 11/23/19 0300 11/23/19 1514 11/24/19 0324 11/24/19 1632 11/25/19 0359  ALBUMIN 2.3* 2.1* 2.3* 2.1* 2.3*   No results for input(s): LIPASE, AMYLASE in the last 168 hours. No results for input(s): AMMONIA in the last 168 hours.  CBC: Recent Labs  Lab 11/23/19 0300 11/24/19 0324  11/25/19 0359 11/25/19 0408 11/26/19 0406 11/27/19 0341  WBC 13.8* 19.0* 22.5*  --  20.2* 19.6*  NEUTROABS 9.6* 14.9* 18.0*  --  16.6* 16.0*  HGB 10.8* 11.2* 11.3* 12.9* 10.0* 9.8*  HCT 32.8* 32.7* 32.7* 38.0* 28.1* 28.0*  MCV 92.7 87.7 86.3  --  85.2 85.1  PLT 129* 164 189  --  161 195    Cardiac Enzymes: No results for input(s): CKTOTAL, CKMB, CKMBINDEX, TROPONINI in the last 168 hours.  BNP: Invalid input(s): POCBNP  CBG: Recent Labs  Lab 11/26/19 1953 11/26/19 2352 11/27/19 0400 11/27/19 0819 11/27/19 1151  GLUCAP 127* 120* 116* 105* 111*     Medications:   . sodium chloride Stopped (11/22/19 1656)  . amiodarone 30 mg/hr (11/27/19 1100)  . cefTRIAXone (ROCEPHIN)  IV Stopped (11/26/19 1834)  . dexmedetomidine (PRECEDEX) IV infusion 0.8 mcg/kg/hr (11/25/19 1400)  . dextrose Stopped (11/22/19 1400)  . feeding supplement (VITAL 1.5 CAL) Stopped (11/23/19 1950)  . heparin 1,600 Units/hr (11/27/19 1100)  . norepinephrine (LEVOPHED) Adult infusion 18 mcg/min (11/27/19 1100)   . B-complex with vitamin C  1 tablet Per Tube Daily  . chlorhexidine gluconate (MEDLINE KIT)  15 mL Mouth Rinse BID  . Chlorhexidine Gluconate Cloth  6 each Topical Daily  . docusate  100 mg Per Tube Daily  . feeding supplement (PRO-STAT SUGAR FREE 64)  30 mL Per Tube TID  . mouth rinse  15 mL Mouth Rinse 10 times per day  . midodrine  10 mg Per Tube Q8H  . oxyCODONE  5 mg Oral Q6H  . pantoprazole sodium  40 mg Per Tube QHS  . sodium chloride flush  10-40 mL Intracatheter Q12H  . white petrolatum   Topical Daily   sodium chloride, acetaminophen (TYLENOL) oral liquid 160 mg/5 mL, acetaminophen, fentaNYL (SUBLIMAZE) injection, midazolam, prochlorperazine, sodium chloride flush

## 2019-11-27 NOTE — Progress Notes (Signed)
Progress Note  Patient Name: [name] [name] Date of Encounter: 11/27/2019  Primary Cardiologist: Fransico Him, MD   Subjective   He is awake this morning.  Still complains of chest soreness.  Tracheostomy in place.  Inpatient Medications    Scheduled Meds: . B-complex with vitamin C  1 tablet Per Tube Daily  . chlorhexidine gluconate (MEDLINE KIT)  15 mL Mouth Rinse BID  . Chlorhexidine Gluconate Cloth  6 each Topical Daily  . docusate  100 mg Per Tube Daily  . feeding supplement (PRO-STAT SUGAR FREE 64)  30 mL Per Tube TID  . mouth rinse  15 mL Mouth Rinse 10 times per day  . midodrine  10 mg Per Tube Q8H  . oxyCODONE  5 mg Oral Q6H  . pantoprazole sodium  40 mg Per Tube QHS  . sodium chloride flush  10-40 mL Intracatheter Q12H  . white petrolatum   Topical Daily   Continuous Infusions: . sodium chloride Stopped (11/22/19 1656)  . amiodarone 30 mg/hr (11/27/19 1000)  . cefTRIAXone (ROCEPHIN)  IV Stopped (11/26/19 1834)  . dexmedetomidine (PRECEDEX) IV infusion 0.8 mcg/kg/hr (11/25/19 1400)  . dextrose Stopped (11/22/19 1400)  . feeding supplement (VITAL 1.5 CAL) Stopped (11/23/19 1950)  . heparin 1,600 Units/hr (11/27/19 1000)  . norepinephrine (LEVOPHED) Adult infusion 18 mcg/min (11/27/19 1000)   PRN Meds: sodium chloride, acetaminophen (TYLENOL) oral liquid 160 mg/5 mL, acetaminophen, fentaNYL (SUBLIMAZE) injection, midazolam, prochlorperazine, sodium chloride flush   Vital Signs    Vitals:   11/27/19 0600 11/27/19 0700 11/27/19 0822 11/27/19 0824  BP: 102/69 99/64    Pulse: 70 68    Resp: 14 16    Temp:   98.2 F (36.8 C)   TempSrc:   Oral   SpO2: 99% 98%  93%  Weight:      Height:        Intake/Output Summary (Last 24 hours) at 11/27/2019 1003 Last data filed at 11/27/2019 1000 Gross per 24 hour  Intake 1383.23 ml  Output 0 ml  Net 1383.23 ml   Last 3 Weights 11/27/2019 11/26/2019 11/25/2019  Weight (lbs) 157 lb 13.6 oz 152 lb 5.4 oz 154 lb 1.6  oz  Weight (kg) 71.6 kg 69.1 kg 69.9 kg      Telemetry    Normal sinus rhythm- Personally Reviewed  ECG    No repeat since 11/23/2019- Personally Reviewed  Physical Exam  Appears older than stated age.  Currently on vent. GEN:  Frail lying flat in bed. Neck: No JVD Cardiac: RRR, no murmurs, rubs, or gallops.  Respiratory:  Has paradoxical sternal motion related to fractures from CPR.Marland Kitchen GI: Soft, nontender, non-distended  MS: No edema; No deformity. Neuro:  Nonfocal  Psych: Normal affect   Labs    High Sensitivity Troponin:   Recent Labs  Lab 11/16/19 1141 11/16/19 1238 11/16/19 1730  TROPONINIHS 171* 243* 679*      Chemistry Recent Labs  Lab 11/24/19 0324 11/24/19 1632 11/25/19 0359 11/25/19 0408 11/26/19 0406 11/27/19 0341  NA 135 136 137 135 136 133*  K 4.3 4.2 4.1 4.0 3.5 3.8  CL 102 102 101  --  102 98  CO2 21* 20* 21*  --  20* 18*  GLUCOSE 116* 106* 117*  --  122* 112*  BUN 25* 23* 21*  --  39* 62*  CREATININE 1.86* 1.84* 1.71*  --  3.96* 5.25*  CALCIUM 7.9* 7.7* 8.0*  --  7.8* 7.8*  ALBUMIN 2.3* 2.1* 2.3*  --   --   --  GFRNONAA 41* 42* 45*  --  16* 12*  GFRAA 47* 48* 53*  --  19* 14*  ANIONGAP 12 14 15   --  14 17*     Hematology Recent Labs  Lab 11/25/19 0359 11/25/19 0408 11/26/19 0406 11/27/19 0341  WBC 22.5*  --  20.2* 19.6*  RBC 3.79*  --  3.30* 3.29*  HGB 11.3* 12.9* 10.0* 9.8*  HCT 32.7* 38.0* 28.1* 28.0*  MCV 86.3  --  85.2 85.1  MCH 29.8  --  30.3 29.8  MCHC 34.6  --  35.6 35.0  RDW 16.8*  --  16.8* 17.3*  PLT 189  --  161 195    BNPNo results for input(s): BNP, PROBNP in the last 168 hours.   DDimer No results for input(s): DDIMER in the last 168 hours.   Radiology    DG Abd Portable 1V  Result Date: 11/25/2019 CLINICAL DATA:  53 year old male with respiratory failure and ileus. NG tube placement. EXAM: PORTABLE ABDOMEN - 1 VIEW COMPARISON:  0655 hours today. FINDINGS: Portable AP upright view at 1241 hours. The NG  type tube has been advanced, and the side hole now projects at the level of the gastric cardia. The tip of the NG tube projects near the tip of the feeding tube. Partially visible right femoral and subclavian lines appear stable. Stable to slightly decreased gaseous distension of bowel from earlier today. Visible lung bases demonstrate cardiomegaly with bilateral retrocardiac lower lobe air bronchograms. Small if any effusions. IMPRESSION: 1. Satisfactory advancement of the enteric tube, side hole now at the level of the gastric cardia. Stable feeding tube. 2. Stable to slightly decreased gas distended bowel in the abdomen from this morning. 3. Lower lobe collapse or consolidation with small if any effusions visible at the lung bases. Electronically Signed   By: Genevie Ann M.D.   On: 11/25/2019 12:53    Cardiac Studies   2D Doppler echocardiogram 11/17/2019 IMPRESSIONS    1. Left ventricular ejection fraction, by visual estimation, is <20%. The left ventricle has severely decreased function. Left ventricular septal wall thickness was mildly increased. Mildly increased left ventricular posterior wall thickness. Cannot  rule out early forming mural thrombus at the apex.  2. Definity contrast agent was given IV to delineate the left ventricular endocardial borders.  3. Left ventricular diastolic function could not be evaluated due to atrial fibrillation.  4. Global right ventricle has normal systolic function.The right ventricular size is normal. No increase in right ventricular wall thickness.  5. Left atrial size was severely dilated.  6. Right atrial size was severely dilated.  7. Moderate to severe mitral annular calcification.  8. The mitral valve is normal in structure. Trace mitral valve regurgitation. No evidence of mitral stenosis.  9. The tricuspid valve is normal in structure. Tricuspid valve regurgitation is trivial. 10. The aortic valve is tricuspid. Aortic valve regurgitation is not  visualized. Mild aortic valve sclerosis without stenosis. 11. The pulmonic valve was normal in structure. Pulmonic valve regurgitation is not visualized.  Patient Profile     53 y.o. 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 Lynetta Mare.  Assessment & Plan    1. PEA arrest -appreciate EP recommendation for LifeVest and amiodarone at discharge. 2. New onset AF - amio will help prevent recurrence.  Once taking p.o., we  can transition amiodarone to oral therapy. 3. Chronic combined systolic HF- Volume control by dialysis. Hemodynamics prevent guideline dirested therapy 4. ESRD - continue 5. Encephalopathy -slightly improving. 6. Leukocytosis: This is concerning and source needs to be identified.   Not much to offer from cardiac standpoint.  This is an unfortunate situation.  For questions or updates, please contact [place] Please consult [email or web address] for contact info under        Signed, Sinclair Grooms, MD  11/27/2019, 10:03 AM

## 2019-11-27 NOTE — Progress Notes (Signed)
ANTICOAGULATION CONSULT NOTE   Pharmacy Consult for Heparin Indication: atrial fibrillation and pulmonary embolus  Patient Measurements: Height: 5' 10.5" (179.1 cm) Weight: 152 lb 5.4 oz (69.1 kg) IBW/kg (Calculated) : 74.15  Vital Signs: Temp: 99.1 F (37.3 C) (12/13 0300) Temp Source: Oral (12/13 0300) BP: 115/51 (12/13 0416) Pulse Rate: 68 (12/13 0416)  Labs: Recent Labs    11/25/19 0359 11/25/19 0408 11/26/19 0406 11/27/19 0341  HGB 11.3* 12.9* 10.0* 9.8*  HCT 32.7* 38.0* 28.1* 28.0*  PLT 189  --  161 195  APTT 133*  --   --   --   HEPARINUNFRC 0.35  --  0.44 0.23*  CREATININE 1.71*  --  3.96* 5.25*    Estimated Creatinine Clearance: 16.3 mL/min (A) (by C-G formula based on SCr of 5.25 mg/dL (H)).  Assessment: 53 yo male s/p VT arrest and with VDRF and on CRRT. He is on heparin for with new onset afib and possible PE. He is noted with poor prognosis -heparin 0.23 units/ml  Goal of Therapy:  Heparin level 0.3-0.7 units/ml  Monitor platelets by anticoagulation protocol: Yes   Plan:  Increase heparin to 1600 units/hr Check heparin level later today Monitor daily heparin level, CBC  Thanks for allowing pharmacy to be a part of this patient's care.  Excell Seltzer, PharmD Clinical Pharmacist  11/27/2019       5:09 AM

## 2019-11-27 NOTE — Progress Notes (Signed)
While RT was working w/ another pt, RN changed vent back to full vent support d/t high RR.

## 2019-11-27 NOTE — Progress Notes (Signed)
ANTICOAGULATION CONSULT NOTE  Pharmacy Consult for Heparin Indication: atrial fibrillation and pulmonary embolus  Patient Measurements: Height: 5' 10.5" (179.1 cm) Weight: 157 lb 13.6 oz (71.6 kg) IBW/kg (Calculated) : 74.15  Vital Signs: Temp: 97.8 F (36.6 C) (12/13 1154) Temp Source: Oral (12/13 1154) BP: 90/73 (12/13 1400) Pulse Rate: 84 (12/13 1400)  Labs: Recent Labs    11/25/19 0359 11/25/19 0359 11/25/19 0408 11/26/19 0406 11/27/19 0341 11/27/19 1514  HGB 11.3*   < > 12.9* 10.0* 9.8*  --   HCT 32.7*  --  38.0* 28.1* 28.0*  --   PLT 189  --   --  161 195  --   APTT 133*  --   --   --   --   --   HEPARINUNFRC 0.35  --   --  0.44 0.23* 0.36  CREATININE 1.71*  --   --  3.96* 5.25*  --    < > = values in this interval not displayed.    Estimated Creatinine Clearance: 16.9 mL/min (A) (by C-G formula based on SCr of 5.25 mg/dL (H)).  Assessment: 53 yo male s/p VT arrest and with VDRF and on CRRT. He is on heparin for with new onset afib and possible PE.   Heparin level is therapeutic.  RN reported some bloody drainage from trach done on 122/10/20, but no bleeding.  Goal of Therapy:  Heparin level 0.3-0.7 units/ml  Monitor platelets by anticoagulation protocol: Yes   Plan:  Increase heparin gtt slightly to 1650 units/hr to maintain therapeutic goal F/U AM labs  Nettie Wyffels D. Mina Marble, PharmD, BCPS, [place] 11/27/2019, 3:37 PM

## 2019-11-28 ENCOUNTER — Inpatient Hospital Stay (HOSPITAL_COMMUNITY): Payer: Medicare Other

## 2019-11-28 LAB — BLOOD GAS, ARTERIAL
Acid-base deficit: 8.2 mmol/L — ABNORMAL HIGH (ref 0.0–2.0)
Bicarbonate: 16.3 mmol/L — ABNORMAL LOW (ref 20.0–28.0)
FIO2: 40
O2 Saturation: 99.3 %
Patient temperature: 36.6
pCO2 arterial: 29.8 mmHg — ABNORMAL LOW (ref 32.0–48.0)
pH, Arterial: 7.356 (ref 7.350–7.450)
pO2, Arterial: 148 mmHg — ABNORMAL HIGH (ref 83.0–108.0)

## 2019-11-28 LAB — BASIC METABOLIC PANEL
Anion gap: 19 — ABNORMAL HIGH (ref 5–15)
BUN: 79 mg/dL — ABNORMAL HIGH (ref 6–20)
CO2: 15 mmol/L — ABNORMAL LOW (ref 22–32)
Calcium: 7.7 mg/dL — ABNORMAL LOW (ref 8.9–10.3)
Chloride: 96 mmol/L — ABNORMAL LOW (ref 98–111)
Creatinine, Ser: 6.67 mg/dL — ABNORMAL HIGH (ref 0.61–1.24)
GFR calc Af Amer: 10 mL/min — ABNORMAL LOW (ref >60)
GFR calc non Af Amer: 9 mL/min — ABNORMAL LOW (ref >60)
Glucose, Bld: 109 mg/dL — ABNORMAL HIGH (ref 70–99)
Potassium: 4 mmol/L (ref 3.5–5.1)
Sodium: 130 mmol/L — ABNORMAL LOW (ref 135–145)

## 2019-11-28 LAB — CBC WITH DIFFERENTIAL/PLATELET
Abs Immature Granulocytes: 0.33 10*3/uL — ABNORMAL HIGH (ref 0.00–0.07)
Basophils Absolute: 0.1 10*3/uL (ref 0.0–0.1)
Basophils Relative: 0 %
Eosinophils Absolute: 0.1 10*3/uL (ref 0.0–0.5)
Eosinophils Relative: 1 %
HCT: 24.6 % — ABNORMAL LOW (ref 39.0–52.0)
Hemoglobin: 9 g/dL — ABNORMAL LOW (ref 13.0–17.0)
Immature Granulocytes: 2 %
Lymphocytes Relative: 5 %
Lymphs Abs: 0.9 10*3/uL (ref 0.7–4.0)
MCH: 29.7 pg (ref 26.0–34.0)
MCHC: 36.6 g/dL — ABNORMAL HIGH (ref 30.0–36.0)
MCV: 81.2 fL (ref 80.0–100.0)
Monocytes Absolute: 1.5 10*3/uL — ABNORMAL HIGH (ref 0.1–1.0)
Monocytes Relative: 9 %
Neutro Abs: 13.9 10*3/uL — ABNORMAL HIGH (ref 1.7–7.7)
Neutrophils Relative %: 83 %
Platelets: 243 10*3/uL (ref 150–400)
RBC: 3.03 MIL/uL — ABNORMAL LOW (ref 4.22–5.81)
RDW: 17.3 % — ABNORMAL HIGH (ref 11.5–15.5)
WBC: 16.7 10*3/uL — ABNORMAL HIGH (ref 4.0–10.5)
nRBC: 0.3 % — ABNORMAL HIGH (ref 0.0–0.2)

## 2019-11-28 LAB — CULTURE, RESPIRATORY W GRAM STAIN

## 2019-11-28 LAB — GLUCOSE, CAPILLARY
Glucose-Capillary: 104 mg/dL — ABNORMAL HIGH (ref 70–99)
Glucose-Capillary: 107 mg/dL — ABNORMAL HIGH (ref 70–99)
Glucose-Capillary: 113 mg/dL — ABNORMAL HIGH (ref 70–99)
Glucose-Capillary: 146 mg/dL — ABNORMAL HIGH (ref 70–99)
Glucose-Capillary: 74 mg/dL (ref 70–99)
Glucose-Capillary: 88 mg/dL (ref 70–99)
Glucose-Capillary: 91 mg/dL (ref 70–99)
Glucose-Capillary: 97 mg/dL (ref 70–99)

## 2019-11-28 LAB — PHOSPHORUS: Phosphorus: 5.2 mg/dL — ABNORMAL HIGH (ref 2.5–4.6)

## 2019-11-28 LAB — HEPARIN LEVEL (UNFRACTIONATED): Heparin Unfractionated: 0.3 IU/mL (ref 0.30–0.70)

## 2019-11-28 LAB — CORTISOL: Cortisol, Plasma: 93.8 ug/dL

## 2019-11-28 LAB — MAGNESIUM: Magnesium: 2.8 mg/dL — ABNORMAL HIGH (ref 1.7–2.4)

## 2019-11-28 MED ORDER — METOCLOPRAMIDE HCL 5 MG/ML IJ SOLN: 10.0000 mg | Freq: Four times a day (QID) | INTRAMUSCULAR | Status: DC

## 2019-11-28 MED ORDER — HYDROCORTISONE NA SUCCINATE PF 100 MG IJ SOLR
50.0000 mg | Freq: Four times a day (QID) | INTRAMUSCULAR | Status: DC
  Administered 2019-11-28 – 2019-11-29 (×4): 50 mg via INTRAVENOUS
  Filled 2019-11-28 (×4): qty 2

## 2019-11-28 MED ORDER — NEOSTIGMINE METHYLSULFATE 10 MG/10ML IV SOLN
0.2500 mg | Freq: Four times a day (QID) | INTRAVENOUS | Status: AC
  Administered 2019-11-28 – 2019-11-29 (×8): 0.25 mg via SUBCUTANEOUS
  Filled 2019-11-28 (×10): qty 0.25

## 2019-11-28 MED ORDER — NEOSTIGMINE METHYLSULFATE 10 MG/10ML IV SOLN
0.2500 mg | Freq: Four times a day (QID) | INTRAVENOUS | Status: DC
  Filled 2019-11-28 (×2): qty 0.25

## 2019-11-28 MED ORDER — ATROPINE SULFATE 1 MG/10ML IJ SOSY
PREFILLED_SYRINGE | INTRAMUSCULAR | Status: AC
  Filled 2019-11-28: qty 10

## 2019-11-28 MED ORDER — OXYCODONE HCL 5 MG/5ML PO SOLN
5.0000 mg | ORAL | Status: DC | PRN
  Administered 2019-11-30 – 2019-12-15 (×36): 5 mg via ORAL
  Filled 2019-11-28 (×38): qty 5

## 2019-11-28 NOTE — Progress Notes (Signed)
NAME:  [name] [name], MRN:  [id number], DOB:  [birth year], LOS: 63 ADMISSION DATE:  11/16/2019, CONSULTATION DATE:  12/2 REFERRING MD:  Dr. Sedonia Small, CHIEF COMPLAINT:  Post cardiac arrest    Brief History   53yo male presented post PEA arrest which occurred during iHD treatment as outpatient. This was a witnessed arrest with quick bystander CPR. Received 3 rounds of epi, 1g of calcium and 50 mEq of bicarb with ROSC after 15 minutes.   Emergent cricothyrotomy performed by EMS due to failed intubation with [facility] airway.   Past Medical History  NSVT 2017 Nonischemic cardiomyopathy Medical noncompliance Polysubstance abuse Hypertension End-stage renal disease Combined systolic and diastolic congestive heart failure Cocaine abuse Alcohol abuse Prolonged QTC  Significant Hospital Events   12/2 Admitted post cardiac arrest, emergent cric, 33 C TTM 12/8 V. tach arrest 12/10 Cric changed to tracheostomy  Consults:  Nephrology Electrophysiology ENT  Procedures:  12/10-trach  Significant Diagnostic Tests:  CTA 12/2-segmental filling defects in the right middle lobe and right lower lobe pulmonary arteries, pneumomediastinum, subcu emphysema CT head 12/8-no acute abnormality  Echocardiogram 11/17/19-LVEF less than 20%, normal LV systolic function  76/22 KUB: Bowel gas pattern c/w ileus, mildly improved from prior imaging.   Micro Data:  COVID 12/2 > negative Aspirate 12/04+ for Haemophilus influenza all/2 blood cultures no growth to date  Antimicrobials:  Unasyn 12/4>>12/7 Ceftriazone 12/7>>  Interim history/subjective:  Sleepy this AM On PS Still on pressors  Objective   Blood pressure (!) 107/52, pulse (!) 103, temperature 98 F (36.7 C), temperature source Oral, resp. rate 16, height 5' 10.5" (1.791 m), weight 72.9 kg, SpO2 95 %. CVP:  [7 mmHg-50 mmHg] 12 mmHg  Vent Mode: PSV;CPAP FiO2 (%):  [40 %] 40 % Set Rate:  [28 bmp] 28 bmp Vt Set:  [440 mL] 440 mL PEEP:  [5  cmH20] 5 cmH20 Pressure Support:  [10 cmH20-15 cmH20] 15 cmH20 Plateau Pressure:  [15 cmH20-25 cmH20] 22 cmH20   Intake/Output Summary (Last 24 hours) at 11/28/2019 0911 Last data filed at 11/28/2019 0600 Gross per 24 hour  Intake 1365.83 ml  Output 580 ml  Net 785.83 ml   Filed Weights   11/26/19 0500 11/27/19 0500 11/28/19 0200  Weight: 69.1 kg 71.6 kg 72.9 kg    Examination: GEN: ill appearing man lying in BED HEENT: trach in place with mild secretion burden CV: regular, borderline tachycardic, ext warm PULM: Clear, no wheezing GI: Soft, +BS cortrak in place EXT: + muscle wasting, trace edena NEURO: will withdraw to pain PSYCH: RASS -1 SKIN: No rashes, LUE AV fistula with good thrill  WBC improved Hgb stable BMP derangements noted  CXR pulmonary edema and effusion AXR ileus  Resolved Hospital Problem list     Assessment & Plan:  # Cardiac arrest- possible PE but contrast timing poor, on empiric heparin regardless.  Vfib rhythm, remains on amiodarone. # Shock - related to bad heart, renal vasoplegia, persistent # Persistent respiratory failure, difficult intubation- crich now trach Possibly PE but filling defects may be artifactual. Combined systolic and diastolic CHF, NSVT, prolonged QT, and polysubstance abuse  # ESRD- to have trial of iHD today # Hx polysustance abuse- cocaine, alcohol, tobacco # Ileus- persistent # Prolonged QT  - Continue amiodarone drip (while no PO access), levophed - Heparin gtt for now, would probably repeat CTA in future as I am pretty doubtful he had PE, pretty rare in HD patients - Add stress steroids, check cortisol (grasping at straws here) -  iHD trial today - Start neostigmine subcutaneous for ileus (reglan not ideal given long QTc), once having flatus can resume TF - Progressive mobility as able  - Hold on further abx, not sure what we treating - No further benzodiazepines unless directed by physician - Switch oxycodone to PRN  - Poor prognosis, started talking with mother about this, she is not ready to discuss GoC    Best practice:  Diet: Hold EN pending return of bowel function Pain/Anxiety/Delirium protocol (if indicated): see above VAP protocol (if indicated): yes, ordered DVT prophylaxis: heparin gtt GI prophylaxis: PPI Glucose control: yes less than 180 Foley: none Mobility: bed rest Code Status: Full Family Communication: Updated mother today Disposition: needs ICU  The patient is critically ill with multiple organ systems failure and requires high complexity decision making for assessment and support, frequent evaluation and titration of therapies, application of advanced monitoring technologies and extensive interpretation of multiple databases. Critical Care Time devoted to patient care services described in this note independent of APP/resident time (if applicable)  is 38 minutes.   Erskine Emery MD [facility] 11/28/2019 9:41 AM Personal pager: [phone] If unanswered, please page CCM On-call: [phone]

## 2019-11-28 NOTE — Progress Notes (Signed)
PT Cancellation Note  Patient Details Name: [name] [name] MRN: [id number] DOB: March 01, 1966   Cancelled Treatment:    Reason Eval/Treat Not Completed: Patient not medically ready;Medical issues which prohibited therapy Per RN, pt not appropriate for PT at this time due to having difficulty keeping 02 saturations up despite being on full vent support. Will follow.  Marguarite Arbour A Emylie Amster 11/28/2019, 11:21 AM Marisa Severin, PT, DPT Acute Rehabilitation Services Pager [phone] Office [phone]

## 2019-11-28 NOTE — Progress Notes (Signed)
ANTICOAGULATION CONSULT NOTE  Pharmacy Consult for Heparin Indication: atrial fibrillation and pulmonary embolus  Patient Measurements: Height: 5' 10.5" (179.1 cm) Weight: 160 lb 11.5 oz (72.9 kg) IBW/kg (Calculated) : 74.15  Vital Signs: Temp: 98 F (36.7 C) (12/14 0837) Temp Source: Oral (12/14 0837) BP: 107/52 (12/14 0845) Pulse Rate: 103 (12/14 0845)  Labs: Recent Labs    11/26/19 0406 11/27/19 0341 11/27/19 1514 11/28/19 0401  HGB 10.0* 9.8*  --  9.0*  HCT 28.1* 28.0*  --  24.6*  PLT 161 195  --  243  HEPARINUNFRC 0.44 0.23* 0.36 0.30  CREATININE 3.96* 5.25*  --  6.67*    Estimated Creatinine Clearance: 13.5 mL/min (A) (by C-G formula based on SCr of 6.67 mg/dL (H)).  Assessment: 59 yoM with cardiac arrest complicated by VDRF and need for CRRT. Pt had intermittent AFib and concern for possible PE on CT chest, pharmacy consulted for IV heparin.  Heparin level therapeutic at 0.3, CBC stable.  Goal of Therapy:  Heparin level 0.3-0.7 units/ml  Monitor platelets by anticoagulation protocol: Yes   Plan:  -Increase heparin to 1700 units/h -Recheck heparin level and CBC with morning labs   Arrie Senate, PharmD, BCPS Clinical Pharmacist [phone] Please check AMION for all [place] numbers 11/28/2019

## 2019-11-28 NOTE — Progress Notes (Addendum)
   Pt remains critically ill on trach with pressor support. Plan for HD today.   No sustained VT noted on tele.   He is not currently a candidate for ICD with unclear prognosis and significant co morbidities.   We will continue to follow for disposition and plan.  Discussed all above with Dr. Loistine Chance "[address] [place], [place]  11/28/2019 8:05 AM

## 2019-11-28 NOTE — Progress Notes (Addendum)
[facility] ROUNDING NOTE   Subjective:   CRRT stopped on 12/11 per note. For HD today - hasn't been put on yet.  Levo was weaned to 17 mcg/min.   Review of systems: limited secondary to vent; denies air hunger, nausea or vomiting   Background:  Is a 53-year-old gentleman history of SVT systolic heart failure polysubstance abuse end-stage renal disease Monday Wednesday Friday dialysis [facility] status post PEA arrest 11/16/2019.  Occurred about 3-1/2 hours into hemodialysis treatment received 3 rounds of epinephrine 1 g of calcium 50 mEq of bicarbonate with ROSC of about 15 minutes.  Emergent cricothyroitomy performed by EMS due to failed intubation.  CRRT initiated 11/18/2019.     Objective:  Vital signs in last 24 hours:  Temp:  [97.8 F (36.6 C)-99 F (37.2 C)] 99 F (37.2 C) (12/14 0300) Pulse Rate:  [67-131] 98 (12/14 0516) Resp:  [13-29] 28 (12/14 0516) BP: (59-128)/(51-102) 105/57 (12/14 0516) SpO2:  [34 %-100 %] 99 % (12/14 0516) Arterial Line BP: (87-260)/(42-256) 103/56 (12/14 0445) FiO2 (%):  [40 %] 40 % (12/14 0516) Weight:  [72.9 kg] 72.9 kg (12/14 0200)  Weight change: 1.3 kg Filed Weights   11/26/19 0500 11/27/19 0500 11/28/19 0200  Weight: 69.1 kg 71.6 kg 72.9 kg    Intake/Output: I/O last 3 completed shifts: In: 2159.4 [P.O.:110; I.V.:1709.3; NG/GT:140; IV Piggyback:200.1] Out: 700 [Emesis/NG output:700]   Intake/Output this shift:  Total I/O In: 574.5 [I.V.:454.5; NG/GT:120] Out: -   Exam General adult male in bed awakens easily on vent  HEENT normocephalic atraumatic extraocular movements intact sclera anicteric Lungs coarse mechanical breath sounds on 40% fio2 peep 5 Heart tachycardic, s1s2 no rub Abdomen soft nontender mildly distended Extremities no edema appreciated  Psych no agitation Access LUE AVF bruit and thrill   [facility]  4h 15min  74.5kg  2/2.5 bath  340/A1.5   P4  Hep 5000     C3 0.75  qTx     no ESA/Fe     Hb 12s usually  CXR 12/8 - no chg bilat layering effusion and IS/ alveloar infiltrates   Assessment:   ESRD- MWF HD.  Started on CRRT 11/19/2019.  DC'd CRRT 12/11. Transitioning back to HD on today, 12/14 via AVF. Note temp line removed due to leukocytosis   Leukocytosis temp HD cath removed on 12/12    Status post arrest x 2- PEA at OP HD on 12/2, 15min until ROSC.  ECHO LV = 20% ejection fraction. Seen by EP team. Then VT arrest on 12/8, recovered.   New onset atrial fibrillation CHA2DS2-VASc score 3 - per primary   Chronic combined congestive heart failure:  EF was 30% , now < 20% not a candidate for ICD due to noncompliance and drug abuse  Prolonged QT   Cardiogenic shock: remains on pressors with requirement improving.  Midodrine 10 tid. Weaning pressors.  At OP HD pt's BP's are normal on coreg and Imdur are home meds  ANEMIA- trending down - no acute need for PRBC's  MBD-severe chronic hyperphosphatemia - for HD today and anticipate addition of binder    ACCESS-AV fistula  Diffuse nonspecific encephalopathy EEG performed   Emergent cricothyroidotomy / acute respiratory failure- per CCM  History of polysubstance abuse  CT scan of chest with suspicion for pulmonary embolus - per primary team     C  11/28/2019 5:56 AM       Basic Metabolic Panel: Recent Labs  Lab   11/23/19 0300 11/24/19 0324 11/24/19 1632 11/25/19 0359 11/25/19 0408 11/26/19 0406 11/27/19 0341 11/28/19 0401  NA 133* 135 136 137 135 136 133* 130*  K 4.8 4.3 4.2 4.1 4.0 3.5 3.8 4.0  CL 100 102 102 101  --  102 98 96*  CO2 17* 21* 20* 21*  --  20* 18* 15*  GLUCOSE 89 116* 106* 117*  --  122* 112* 109*  BUN 22* 25* 23* 21*  --  39* 62* 79*  CREATININE 2.17* 1.86* 1.84* 1.71*  --  3.96* 5.25* 6.67*  CALCIUM 7.8* 7.9* 7.7* 8.0*  --  7.8* 7.8* 7.7*  MG 2.5* 2.5*  --  2.5*  --   --  2.8* 2.8*  PHOS 3.3 1.5* 1.8* 3.0  --   --  3.7 5.2*    Liver Function  Tests: Recent Labs  Lab 11/23/19 0300 11/23/19 1514 11/24/19 0324 11/24/19 1632 11/25/19 0359  ALBUMIN 2.3* 2.1* 2.3* 2.1* 2.3*   No results for input(s): LIPASE, AMYLASE in the last 168 hours. No results for input(s): AMMONIA in the last 168 hours.  CBC: Recent Labs  Lab 11/24/19 0324 11/25/19 0359 11/25/19 0408 11/26/19 0406 11/27/19 0341 11/28/19 0401  WBC 19.0* 22.5*  --  20.2* 19.6* 16.7*  NEUTROABS 14.9* 18.0*  --  16.6* 16.0* 13.9*  HGB 11.2* 11.3* 12.9* 10.0* 9.8* 9.0*  HCT 32.7* 32.7* 38.0* 28.1* 28.0* 24.6*  MCV 87.7 86.3  --  85.2 85.1 81.2  PLT 164 189  --  161 195 243    Cardiac Enzymes: No results for input(s): CKTOTAL, CKMB, CKMBINDEX, TROPONINI in the last 168 hours.  BNP: Invalid input(s): POCBNP  CBG: Recent Labs  Lab 11/27/19 1151 11/27/19 1657 11/27/19 1947 11/28/19 0023 11/28/19 0405  GLUCAP 111* 110* 111* 104* 97     Medications:   . sodium chloride Stopped (11/22/19 1656)  . amiodarone 30 mg/hr (11/28/19 0400)  . dexmedetomidine (PRECEDEX) IV infusion 0.8 mcg/kg/hr (11/25/19 1400)  . dextrose Stopped (11/22/19 1400)  . feeding supplement (VITAL 1.5 CAL) Stopped (11/23/19 1950)  . heparin 1,650 Units/hr (11/28/19 0400)  . norepinephrine (LEVOPHED) Adult infusion 18 mcg/min (11/28/19 0400)   . B-complex with vitamin C  1 tablet Per Tube Daily  . chlorhexidine gluconate (MEDLINE KIT)  15 mL Mouth Rinse BID  . Chlorhexidine Gluconate Cloth  6 each Topical Daily  . Chlorhexidine Gluconate Cloth  6 each Topical Q0600  . docusate  100 mg Per Tube Daily  . feeding supplement (PRO-STAT SUGAR FREE 64)  30 mL Per Tube TID  . mouth rinse  15 mL Mouth Rinse 10 times per day  . midodrine  10 mg Per Tube Q8H  . oxyCODONE  5 mg Oral Q6H  . pantoprazole sodium  40 mg Per Tube QHS  . sodium chloride flush  10-40 mL Intracatheter Q12H  . white petrolatum   Topical Daily   sodium chloride, acetaminophen (TYLENOL) oral liquid 160 mg/5 mL,  acetaminophen, fentaNYL (SUBLIMAZE) injection, midazolam, prochlorperazine, sodium chloride flush      

## 2019-11-29 ENCOUNTER — Inpatient Hospital Stay (HOSPITAL_COMMUNITY): Payer: Medicare Other

## 2019-11-29 LAB — RENAL FUNCTION PANEL
Albumin: 2 g/dL — ABNORMAL LOW (ref 3.5–5.0)
Anion gap: 17 — ABNORMAL HIGH (ref 5–15)
BUN: 67 mg/dL — ABNORMAL HIGH (ref 6–20)
CO2: 18 mmol/L — ABNORMAL LOW (ref 22–32)
Calcium: 7.6 mg/dL — ABNORMAL LOW (ref 8.9–10.3)
Chloride: 99 mmol/L (ref 98–111)
Creatinine, Ser: 5.91 mg/dL — ABNORMAL HIGH (ref 0.61–1.24)
GFR calc Af Amer: 12 mL/min — ABNORMAL LOW (ref >60)
GFR calc non Af Amer: 10 mL/min — ABNORMAL LOW (ref >60)
Glucose, Bld: 129 mg/dL — ABNORMAL HIGH (ref 70–99)
Phosphorus: 5 mg/dL — ABNORMAL HIGH (ref 2.5–4.6)
Potassium: 4 mmol/L (ref 3.5–5.1)
Sodium: 134 mmol/L — ABNORMAL LOW (ref 135–145)

## 2019-11-29 LAB — BASIC METABOLIC PANEL
Anion gap: 18 — ABNORMAL HIGH (ref 5–15)
BUN: 81 mg/dL — ABNORMAL HIGH (ref 6–20)
CO2: 18 mmol/L — ABNORMAL LOW (ref 22–32)
Calcium: 7.7 mg/dL — ABNORMAL LOW (ref 8.9–10.3)
Chloride: 97 mmol/L — ABNORMAL LOW (ref 98–111)
Creatinine, Ser: 6.61 mg/dL — ABNORMAL HIGH (ref 0.61–1.24)
GFR calc Af Amer: 10 mL/min — ABNORMAL LOW (ref >60)
GFR calc non Af Amer: 9 mL/min — ABNORMAL LOW (ref >60)
Glucose, Bld: 140 mg/dL — ABNORMAL HIGH (ref 70–99)
Potassium: 3.6 mmol/L (ref 3.5–5.1)
Sodium: 133 mmol/L — ABNORMAL LOW (ref 135–145)

## 2019-11-29 LAB — CBC WITH DIFFERENTIAL/PLATELET
Abs Immature Granulocytes: 0.26 10*3/uL — ABNORMAL HIGH (ref 0.00–0.07)
Basophils Absolute: 0 10*3/uL (ref 0.0–0.1)
Basophils Relative: 0 %
Eosinophils Absolute: 0 10*3/uL (ref 0.0–0.5)
Eosinophils Relative: 0 %
HCT: 21.5 % — ABNORMAL LOW (ref 39.0–52.0)
Hemoglobin: 7.8 g/dL — ABNORMAL LOW (ref 13.0–17.0)
Immature Granulocytes: 2 %
Lymphocytes Relative: 3 %
Lymphs Abs: 0.4 10*3/uL — ABNORMAL LOW (ref 0.7–4.0)
MCH: 28.5 pg (ref 26.0–34.0)
MCHC: 36.3 g/dL — ABNORMAL HIGH (ref 30.0–36.0)
MCV: 78.5 fL — ABNORMAL LOW (ref 80.0–100.0)
Monocytes Absolute: 0.8 10*3/uL (ref 0.1–1.0)
Monocytes Relative: 6 %
Neutro Abs: 12.9 10*3/uL — ABNORMAL HIGH (ref 1.7–7.7)
Neutrophils Relative %: 89 %
Platelets: 278 10*3/uL (ref 150–400)
RBC: 2.74 MIL/uL — ABNORMAL LOW (ref 4.22–5.81)
RDW: 17 % — ABNORMAL HIGH (ref 11.5–15.5)
WBC: 14.4 10*3/uL — ABNORMAL HIGH (ref 4.0–10.5)
nRBC: 0.4 % — ABNORMAL HIGH (ref 0.0–0.2)

## 2019-11-29 LAB — GLUCOSE, CAPILLARY
Glucose-Capillary: 101 mg/dL — ABNORMAL HIGH (ref 70–99)
Glucose-Capillary: 103 mg/dL — ABNORMAL HIGH (ref 70–99)
Glucose-Capillary: 116 mg/dL — ABNORMAL HIGH (ref 70–99)
Glucose-Capillary: 130 mg/dL — ABNORMAL HIGH (ref 70–99)
Glucose-Capillary: 66 mg/dL — ABNORMAL LOW (ref 70–99)
Glucose-Capillary: 76 mg/dL (ref 70–99)

## 2019-11-29 LAB — HEPARIN LEVEL (UNFRACTIONATED): Heparin Unfractionated: 0.34 IU/mL (ref 0.30–0.70)

## 2019-11-29 LAB — MAGNESIUM: Magnesium: 2.6 mg/dL — ABNORMAL HIGH (ref 1.7–2.4)

## 2019-11-29 MED ORDER — CHLORHEXIDINE GLUCONATE CLOTH 2 % EX PADS
6.0000 | MEDICATED_PAD | Freq: Every day | CUTANEOUS | Status: DC
  Administered 2019-11-30 – 2019-12-05 (×2): 6 via TOPICAL

## 2019-11-29 MED ORDER — HYDROCORTISONE 10 MG PO TABS
10.0000 mg | ORAL_TABLET | Freq: Two times a day (BID) | ORAL | Status: DC
  Administered 2019-11-29 – 2019-11-30 (×3): 10 mg via ORAL
  Filled 2019-11-29 (×3): qty 1

## 2019-11-29 MED ORDER — AMIODARONE HCL 200 MG PO TABS
200.0000 mg | ORAL_TABLET | Freq: Every day | ORAL | Status: DC
  Administered 2019-11-29 – 2019-11-30 (×2): 200 mg via ORAL
  Filled 2019-11-29 (×2): qty 1

## 2019-11-29 MED ORDER — DEXTROSE 50 % IV SOLN
INTRAVENOUS | Status: AC
  Administered 2019-11-29: 12.5 g via INTRAVENOUS
  Filled 2019-11-29: qty 50

## 2019-11-29 MED ORDER — VITAL HIGH PROTEIN PO LIQD
1000.0000 mL | ORAL | Status: DC
  Administered 2019-11-29: 1000 mL

## 2019-11-29 MED ORDER — VITAL 1.5 CAL PO LIQD
1000.0000 mL | ORAL | Status: DC
  Administered 2019-11-29 – 2019-12-01 (×2): 1000 mL
  Filled 2019-11-29 (×2): qty 1000

## 2019-11-29 MED ORDER — DARBEPOETIN ALFA 100 MCG/0.5ML IJ SOSY
100.0000 ug | PREFILLED_SYRINGE | Freq: Once | INTRAMUSCULAR | Status: AC
  Administered 2019-11-29: 100 ug via SUBCUTANEOUS
  Filled 2019-11-29: qty 0.5

## 2019-11-29 MED ORDER — DEXTROSE 50 % IV SOLN: 12.5000 g | INTRAVENOUS | Status: AC

## 2019-11-29 MED ORDER — DARBEPOETIN ALFA 100 MCG/0.5ML IJ SOSY
100.0000 ug | PREFILLED_SYRINGE | Freq: Once | INTRAMUSCULAR | Status: AC
  Filled 2019-11-29: qty 0.5

## 2019-11-29 MED ORDER — POTASSIUM CHLORIDE 10 MEQ/50ML IV SOLN
10.0000 meq | INTRAVENOUS | Status: AC
  Administered 2019-11-29 (×2): 10 meq via INTRAVENOUS
  Filled 2019-11-29 (×2): qty 50

## 2019-11-29 MED ORDER — AMIODARONE IV BOLUS ONLY 150 MG/100ML
150.0000 mg | Freq: Once | INTRAVENOUS | Status: AC
  Administered 2019-11-29: 150 mg via INTRAVENOUS
  Filled 2019-11-29: qty 100

## 2019-11-29 NOTE — Progress Notes (Addendum)
[name] [name] Progress Note Patient Name: [name] [name] DOB: 20-Jul-1966 MRN: [id number]   Date of Service  11/29/2019  HPI/Events of Note  Paroxsymal Afib with rate dependent bundle branch block x 2, K+ 3.6, Pt is on oral Amiodarone which started today.  eICU Interventions  KCL 20 meq iv bolus over 2 hours, Amiodarone 150 mg iv x 1 .        Kerry Kass Onya Eutsler 11/29/2019, 9:24 PM

## 2019-11-29 NOTE — Progress Notes (Signed)
Cortrak Tube Team Note:  Consult received to advance Cortrak feeding tube post-pyloric  RD able to advance tube without difficulty    X-ray is required, abdominal x-ray has been ordered by the Cortrak team. Please confirm tube placement before using the Cortrak tube.   If the tube becomes dislodged please keep the tube and contact the Cortrak team at [email or web address] (password [email or web address]) for replacement.  If after hours and replacement cannot be delayed, place a NG tube and confirm placement with an abdominal x-ray.    Koleen Distance MS, RD, LDN Pager #- [phone] Office#- [phone] After Hours Pager: [phone]

## 2019-11-29 NOTE — Progress Notes (Signed)
PT Cancellation Note  Patient Details Name: [name] [name] MRN: [id number] DOB: 1966-05-04   Cancelled Treatment:    Reason Eval/Treat Not Completed: Patient not medically ready. Per discussion pt remains with femoral line with no plans to D/C today. RN also reporting patient has been able to follow commands but remains sleepy throughout the day, likely unable to follow through with HEP at this time. PT will hold evaluation at this time until patient is able to participate in functional mobility and sitting activities.   Zenaida Niece 11/29/2019, 10:40 AM

## 2019-11-29 NOTE — Progress Notes (Addendum)
ANTICOAGULATION CONSULT NOTE  Pharmacy Consult for Heparin Indication: atrial fibrillation and pulmonary embolus  Patient Measurements: Height: 5' 10.5" (179.1 cm) Weight: 162 lb 11.2 oz (73.8 kg) IBW/kg (Calculated) : 74.15  Vital Signs: Temp: 97.8 F (36.6 C) (12/15 0739) Temp Source: Oral (12/15 0739) BP: 88/69 (12/15 0900) Pulse Rate: 94 (12/15 0900)  Labs: Recent Labs    11/27/19 0341 11/27/19 1514 11/28/19 0401 11/29/19 0411 11/29/19 0632  HGB 9.8*  --  9.0* 7.8*  --   HCT 28.0*  --  24.6* 21.5*  --   PLT 195  --  243 278  --   HEPARINUNFRC 0.23* 0.36 0.30 0.34  --   CREATININE 5.25*  --  6.67*  --  5.91*    Estimated Creatinine Clearance: 15.4 mL/min (A) (by C-G formula based on SCr of 5.91 mg/dL (H)).  Assessment: 85 yoM with cardiac arrest complicated by VDRF and need for CRRT. Pt had intermittent AFib and concern for possible PE on CT chest, pharmacy consulted for IV heparin.  Heparin level therapeutic at 0.34, CBC down slightly.  Goal of Therapy:  Heparin level 0.3-0.7 units/ml  Monitor platelets by anticoagulation protocol: Yes   Plan:  -Continue heparin 1700 units/h -Recheck heparin level and CBC with morning labs   Arrie Senate, PharmD, BCPS Clinical Pharmacist [phone] Please check AMION for all [place] numbers 11/29/2019

## 2019-11-29 NOTE — Progress Notes (Signed)
Nutrition Follow-up  DOCUMENTATION CODES:   Not applicable  INTERVENTION:   If pt does not tolerate TF at this time, need to consider TPN initiation given no nutrition x 6 days.   Plan to move Cortrak tube tip to post-pyloric position  Tube Feeding:  Vital 1.5 at 20, goal 50 ml/hr Pro-Stat 30 mL TID Provides 2100 kcals, 126 g of protein and 912 mL of free water   NUTRITION DIAGNOSIS:   Inadequate oral intake related to acute illness as evidenced by NPO status.  Being Addressed  GOAL:   Patient will meet greater than or equal to 90% of their needs  Not Met  MONITOR:   Vent status, Labs, Weight trends, TF tolerance  REASON FOR ASSESSMENT:   Ventilator    ASSESSMENT:   53 yo male admitted post cardiac arrest at iHD, required emergent cricothyrotomy in the field. PMH includes ESRD on HD, HTN, CHF, cocaine and EtOH abuse, medical nonadherence  12/02 Admit post Cardiac Arrest, TTM 33 degrees 12/04 Gastric Cortrak placed, CRRT initiated 12/08 Arrested, CRRT to resume 12/10 Cricthothyrotomy changed to Trach 12/11 KUB with ileus pattern, CRRT d/c 12/13 Abd xray with improved ileus  Noted pt follows commands but sleepy. Unable to work with PT due to femoral line   Last HD on 12/14 with no UF, next iHD 12/16  Start trickle TF today per MD; it appears TF has been on hold for many days. The last documented TF infusion was on 12/09. No nutrition x 6 days. Pt vomited on 12/09 and TF never restarted despite active TF order  Abdominal xray from 12/13 shows improved ileus Noted pt with Cortrak in place but tip is gastric. Pt also with OG tube in stomach. Discussed advancing tube tip  EDW 74.5 kg; current wt 73.8 kg; lowest weight of 69 kg on 12/12. Likely pt has experienced true weight loss (not fluid related) give lack of nutrition  Labs: sodium 134 (L), phosphorus 5.0 (acceptable for HD) Meds: B-complex with C   Diet Order:   Diet Order    None      EDUCATION  NEEDS:   Not appropriate for education at this time  Skin:  Skin Assessment: Reviewed RN Assessment  Last BM:  12/15  Height:   Ht Readings from Last 1 Encounters:  11/16/19 5' 10.5" (1.791 m)    Weight:   Wt Readings from Last 1 Encounters:  11/29/19 73.8 kg    Ideal Body Weight:     BMI:  Body mass index is 23.01 kg/m.  Estimated Nutritional Needs:   Kcal:  2118 kcals  Protein:  120-160 g  Fluid:  1000 mL plus UOP   Cate  MS, RDN, LDN, CNSC [phone] Pager  [phone] Weekend/On-Call Pager  

## 2019-11-29 NOTE — Progress Notes (Addendum)
[facility] ROUNDING NOTE   Subjective:   Patient had HD on 12/14 with no UF.  Some issues with AVF alarming with patient movement per nursing.  His levo is down on 4 mcg/minute.    Review of systems: limited secondary to vent; denies air hunger, nausea or vomiting   Background:  Is a 53 year old gentleman history of SVT systolic heart failure polysubstance abuse end-stage renal disease Monday Wednesday Friday dialysis [facility] kidney center status post PEA arrest 11/16/2019.  Occurred about 3-1/2 hours into hemodialysis treatment received 3 rounds of epinephrine 1 g of calcium 50 mEq of bicarbonate with ROSC of about 15 minutes.  Emergent cricothyroitomy performed by EMS due to failed intubation.  CRRT initiated 11/18/2019.     Objective:  Vital signs in last 24 hours:  Temp:  [97.8 F (36.6 C)-98.6 F (37 C)] 97.8 F (36.6 C) (12/14 2345) Pulse Rate:  [83-115] 98 (12/14 2230) Resp:  [8-32] 22 (12/15 0400) BP: (80-131)/(51-103) 95/77 (12/15 0400) SpO2:  [85 %-99 %] 97 % (12/15 0355) Arterial Line BP: (98-143)/(48-76) 113/63 (12/15 0400) FiO2 (%):  [40 %] 40 % (12/15 0355) Weight:  [73.8 kg] 73.8 kg (12/15 0400)  Weight change: 0.9 kg Filed Weights   11/27/19 0500 11/28/19 0200 11/29/19 0400  Weight: 71.6 kg 72.9 kg 73.8 kg    Intake/Output: I/O last 3 completed shifts: In: 2290.2 [P.O.:110; I.V.:1740.1; NG/GT:340; IV Piggyback:100.1] Out: 700 [Emesis/NG output:700]   Intake/Output this shift:  Total I/O In: 440 [I.V.:440] Out: 1 [Stool:1]  Exam General adult male in bed awake on arrival, on vent HEENT normocephalic atraumatic extraocular movements intact sclera anicteric Lungs coarse mechanical breath sounds on 30% fio2 peep 5 Heart tachycardic, s1s2 no rub Abdomen soft nontender mildly distended Extremities no edema appreciated  Psych no agitation Access LUE AVF bruit and thrill   [facility]: [facility]  4h 69mn  74.5kg  2/2.5 bath  340/A1.5    P4  Hep 5000     C3 0.75 qTx     no ESA/Fe     Hb 12s usually  CXR 12/8 - no chg bilat layering effusion and IS/ alveloar infiltrates   Assessment:   ESRD- MWF HD.  Started on CRRT 11/19/2019.  [facility]CRRT 12/11.    - Had HD on 12/14 PM. Note temp line removed due to leukocytosis.     - renal panel ordered now and daily - to follow  - Plan for HD on 12/16   Status post arrest x 2- PEA at OP HD on 12/2, 132m until ROSC.  ECHO LV = 20% ejection fraction. Seen by EP team. Then VT arrest on 12/8, recovered.   New onset atrial fibrillation CHA2DS2-VASc score 3 - per primary   Chronic combined congestive heart failure:  EF was 30% , now < 20% not a candidate for ICD due to noncompliance and drug abuse  Prolonged QT - noted   Cardiogenic shock: pressors per primary team.  On midodrine 10 tid.  At OP HD pt's BP's are normal on coreg and Imdur are home meds  ANEMIA- CKD and acute decline as well.  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  anticipate addition of binder soon - follow trends.  ACCESS-AV fistula  Diffuse nonspecific encephalopathy EEG performed   Emergent cricothyroidotomy / acute respiratory failure- per CCM  History of polysubstance abuse  CT scan of chest with suspicion for pulmonary embolus - per primary team  Basic Metabolic Panel: Recent Labs  Lab 11/23/19 0300 11/24/19 0324 11/24/19 1632 11/25/19 0359 11/25/19 0408 11/26/19 0406 11/27/19 0341 11/28/19 0401  NA 133* 135 136 137 135 136 133* 130*  K 4.8 4.3 4.2 4.1 4.0 3.5 3.8 4.0  CL 100 102 102 101  --  102 98 96*  CO2 17* 21* 20* 21*  --  20* 18* 15*  GLUCOSE 89 116* 106* 117*  --  122* 112* 109*  BUN 22* 25* 23* 21*  --  39* 62* 79*  CREATININE 2.17* 1.86* 1.84* 1.71*  --  3.96* 5.25* 6.67*  CALCIUM 7.8* 7.9* 7.7* 8.0*  --  7.8* 7.8* 7.7*  MG 2.5* 2.5*  --  2.5*  --   --  2.8* 2.8*  PHOS 3.3 1.5* 1.8* 3.0  --   --  3.7 5.2*    Liver Function Tests: Recent Labs   Lab 11/23/19 0300 11/23/19 1514 11/24/19 0324 11/24/19 1632 11/25/19 0359  ALBUMIN 2.3* 2.1* 2.3* 2.1* 2.3*   No results for input(s): LIPASE, AMYLASE in the last 168 hours. No results for input(s): AMMONIA in the last 168 hours.  CBC: Recent Labs  Lab 11/25/19 0359 11/25/19 0408 11/26/19 0406 11/27/19 0341 11/28/19 0401 11/29/19 0411  WBC 22.5*  --  20.2* 19.6* 16.7* 14.4*  NEUTROABS 18.0*  --  16.6* 16.0* 13.9* 12.9*  HGB 11.3* 12.9* 10.0* 9.8* 9.0* 7.8*  HCT 32.7* 38.0* 28.1* 28.0* 24.6* 21.5*  MCV 86.3  --  85.2 85.1 81.2 78.5*  PLT 189  --  161 195 243 278    Cardiac Enzymes: No results for input(s): CKTOTAL, CKMB, CKMBINDEX, TROPONINI in the last 168 hours.  BNP: Invalid input(s): POCBNP  CBG: Recent Labs  Lab 11/28/19 1245 11/28/19 1634 11/28/19 2001 11/28/19 2339 11/29/19 0402  GLUCAP 88 113* 107* 146* 130*     Medications:   . sodium chloride Stopped (11/22/19 1656)  . amiodarone 30 mg/hr (11/29/19 0400)  . feeding supplement (VITAL 1.5 CAL) Stopped (11/23/19 1950)  . heparin 1,700 Units/hr (11/29/19 0400)  . norepinephrine (LEVOPHED) Adult infusion 4 mcg/min (11/29/19 0400)   . B-complex with vitamin C  1 tablet Per Tube Daily  . chlorhexidine gluconate (MEDLINE KIT)  15 mL Mouth Rinse BID  . Chlorhexidine Gluconate Cloth  6 each Topical Daily  . Chlorhexidine Gluconate Cloth  6 each Topical Q0600  . docusate  100 mg Per Tube Daily  . feeding supplement (PRO-STAT SUGAR FREE 64)  30 mL Per Tube TID  . hydrocortisone sod succinate (SOLU-CORTEF) inj  50 mg Intravenous Q6H  . mouth rinse  15 mL Mouth Rinse 10 times per day  . midodrine  10 mg Per Tube Q8H  . neostigmine  0.25 mg Subcutaneous QID  . pantoprazole sodium  40 mg Per Tube QHS  . sodium chloride flush  10-40 mL Intracatheter Q12H  . white petrolatum   Topical Daily   sodium chloride, acetaminophen (TYLENOL) oral liquid 160 mg/5 mL, acetaminophen, fentaNYL (SUBLIMAZE) injection,  oxyCODONE, prochlorperazine, sodium chloride flush     Claudia Desanctis 11/29/2019 5:45 AM

## 2019-11-29 NOTE — Progress Notes (Signed)
NAME:  [name] [name], MRN:  [id number], DOB:  [birth year], LOS: 41 ADMISSION DATE:  11/16/2019, CONSULTATION DATE:  12/2 REFERRING MD:  Dr. Sedonia Small, CHIEF COMPLAINT:  Post cardiac arrest    Brief History   53yo male presented post PEA arrest which occurred during iHD treatment as outpatient. This was a witnessed arrest with quick bystander CPR. Received 3 rounds of epi, 1g of calcium and 50 mEq of bicarb with ROSC after 15 minutes.   Emergent cricothyrotomy performed by EMS due to failed intubation with [facility] airway.   Past Medical History  NSVT 2017 Nonischemic cardiomyopathy Medical noncompliance Polysubstance abuse Hypertension End-stage renal disease Combined systolic and diastolic congestive heart failure Cocaine abuse Alcohol abuse Prolonged QTC  Significant Hospital Events   12/2 Admitted post cardiac arrest, emergent cric, 33 C TTM 12/8 V. tach arrest 12/10 Cric changed to tracheostomy  Consults:  Nephrology Electrophysiology ENT  Procedures:  12/10-trach  Significant Diagnostic Tests:  CTA 12/2-segmental filling defects in the right middle lobe and right lower lobe pulmonary arteries, pneumomediastinum, subcu emphysema CT head 12/8-no acute abnormality  Echocardiogram 11/17/19-LVEF less than 20%, normal LV systolic function  94/70 KUB: Bowel gas pattern c/w ileus, mildly improved from prior imaging.   Micro Data:  COVID 12/2 > negative Aspirate 12/04+ for Haemophilus influenza all/2 blood cultures no growth to date  Antimicrobials:  Unasyn 12/4>>12/7 Ceftriazone 12/7>>  Interim history/subjective:  No events.  Pressor requirements have improved dramatically.  I do not see where he actually had hemodialysis yesterday.  Apparently there are plans for this today.  Objective   Blood pressure (!) 88/69, pulse 94, temperature 97.8 F (36.6 C), temperature source Oral, resp. rate 15, height 5' 10.5" (1.791 m), weight 73.8 kg, SpO2 100 %. CVP:  [12 mmHg-17 mmHg] 14  mmHg  Vent Mode: CPAP;PSV FiO2 (%):  [40 %] 40 % Set Rate:  [20 bmp-28 bmp] 20 bmp Vt Set:  [440 mL-590 mL] 590 mL PEEP:  [5 cmH20] 5 cmH20 Pressure Support:  [12 cmH20] 12 cmH20 Plateau Pressure:  [15 cmH20-22 cmH20] 21 cmH20   Intake/Output Summary (Last 24 hours) at 11/29/2019 9628 Last data filed at 11/29/2019 0900 Gross per 24 hour  Intake 1048.23 ml  Output 1 ml  Net 1047.23 ml   Filed Weights   11/27/19 0500 11/28/19 0200 11/29/19 0400  Weight: 71.6 kg 72.9 kg 73.8 kg    Examination: GEN: ill appearing man lying in BED HEENT: trach in place with mild-moderate secretion burden CV: regular, borderline tachycardic, ext warm PULM: He has rhonchi along the right, left lung is clear, he is mildly tachypneic GI: Abdomen remains distended, he does have bowel sounds EXT: + muscle wasting, trace edena NEURO: Moves all 4 extremities to command PSYCH: RASS -1 SKIN: No rashes, LUE AV fistula with good thrill  WBC improved Hemoglobin slightly down today BMP derangements noted  CXR pulmonary edema and effusion AXR ileus  Resolved Hospital Problem list     Assessment & Plan:  # Cardiac arrest- possible PE but contrast timing poor, on empiric heparin regardless.  Vfib rhythm, remains on amiodarone. # Shock - related to bad heart, renal vasoplegia, persistent # Persistent respiratory failure, difficult intubation- crich now trach Possibly PE but filling defects may be artifactual. Combined systolic and diastolic CHF, NSVT, prolonged QT, and polysubstance abuse  # ESRD- to have trial of iHD today # Hx polysustance abuse- cocaine, alcohol, tobacco # Ileus- persistent  # Prolonged QT  -Switch amiodarone to p.o. 200  mg daily, cardiology can adjust as needed -Continue heparin drip for now, low threshold to DC if continues to drop hemoglobin, I doubt he had a pulmonary embolism, a repeat CTA chest down the line may be helpful to confirm this; transfusion threshold hemoglobin 7,  if continues to drop and/or pressor requirements increase (unrelated to HD), would consider CT abdomen pelvis rule out RP bleed.  He has no signs or symptoms of a GI bleed at this time, he is on a PPI. -Hydrocortisone, switch to p.o., seems to respond to this -iHD trial today -Continue neostigmine x1 more day then can DC -Progressive mobility as able  -Hold on further abx, not sure what we treating -No further benzodiazepines unless directed by physician -Switch oxycodone to PRN -Pressure support trials as tolerated -Remains in ICU pending vent and pressor liberation  Best practice:  Diet: Start trickle feeds Pain/Anxiety/Delirium protocol (if indicated): see above VAP protocol (if indicated): yes, ordered DVT prophylaxis: heparin gtt GI prophylaxis: PPI Glucose control: yes less than 180 Foley: none Mobility: bed rest Code Status: Full Family Communication: Will update son later Disposition: needs ICU  The patient is critically ill with multiple organ systems failure and requires high complexity decision making for assessment and support, frequent evaluation and titration of therapies, application of advanced monitoring technologies and extensive interpretation of multiple databases. Critical Care Time devoted to patient care services described in this note independent of APP/resident time (if applicable)  is 32 minutes.   Erskine Emery MD [facility] 11/29/2019 9:37 AM Personal pager: [phone] If unanswered, please page CCM On-call: [phone]

## 2019-11-30 ENCOUNTER — Inpatient Hospital Stay (HOSPITAL_COMMUNITY): Payer: Medicare Other

## 2019-11-30 DIAGNOSIS — I2699 Other pulmonary embolism without acute cor pulmonale: Secondary | ICD-10-CM

## 2019-11-30 LAB — GLUCOSE, CAPILLARY
Glucose-Capillary: 100 mg/dL — ABNORMAL HIGH (ref 70–99)
Glucose-Capillary: 107 mg/dL — ABNORMAL HIGH (ref 70–99)
Glucose-Capillary: 124 mg/dL — ABNORMAL HIGH (ref 70–99)
Glucose-Capillary: 150 mg/dL — ABNORMAL HIGH (ref 70–99)
Glucose-Capillary: 86 mg/dL (ref 70–99)
Glucose-Capillary: 91 mg/dL (ref 70–99)
Glucose-Capillary: 93 mg/dL (ref 70–99)

## 2019-11-30 LAB — CBC WITH DIFFERENTIAL/PLATELET
Abs Immature Granulocytes: 0.49 10*3/uL — ABNORMAL HIGH (ref 0.00–0.07)
Basophils Absolute: 0 10*3/uL (ref 0.0–0.1)
Basophils Relative: 0 %
Eosinophils Absolute: 0 10*3/uL (ref 0.0–0.5)
Eosinophils Relative: 0 %
HCT: 21.5 % — ABNORMAL LOW (ref 39.0–52.0)
Hemoglobin: 7.8 g/dL — ABNORMAL LOW (ref 13.0–17.0)
Immature Granulocytes: 3 %
Lymphocytes Relative: 7 %
Lymphs Abs: 1 10*3/uL (ref 0.7–4.0)
MCH: 29.2 pg (ref 26.0–34.0)
MCHC: 36.3 g/dL — ABNORMAL HIGH (ref 30.0–36.0)
MCV: 80.5 fL (ref 80.0–100.0)
Monocytes Absolute: 1.6 10*3/uL — ABNORMAL HIGH (ref 0.1–1.0)
Monocytes Relative: 10 %
Neutro Abs: 11.9 10*3/uL — ABNORMAL HIGH (ref 1.7–7.7)
Neutrophils Relative %: 80 %
Platelets: 398 10*3/uL (ref 150–400)
RBC: 2.67 MIL/uL — ABNORMAL LOW (ref 4.22–5.81)
RDW: 17.5 % — ABNORMAL HIGH (ref 11.5–15.5)
WBC: 15 10*3/uL — ABNORMAL HIGH (ref 4.0–10.5)
nRBC: 0.7 % — ABNORMAL HIGH (ref 0.0–0.2)

## 2019-11-30 LAB — CULTURE, BLOOD (ROUTINE X 2)
Culture: NO GROWTH
Culture: NO GROWTH

## 2019-11-30 LAB — RENAL FUNCTION PANEL
Albumin: 2.1 g/dL — ABNORMAL LOW (ref 3.5–5.0)
Anion gap: 18 — ABNORMAL HIGH (ref 5–15)
BUN: 88 mg/dL — ABNORMAL HIGH (ref 6–20)
CO2: 18 mmol/L — ABNORMAL LOW (ref 22–32)
Calcium: 7.8 mg/dL — ABNORMAL LOW (ref 8.9–10.3)
Chloride: 96 mmol/L — ABNORMAL LOW (ref 98–111)
Creatinine, Ser: 7.13 mg/dL — ABNORMAL HIGH (ref 0.61–1.24)
GFR calc Af Amer: 9 mL/min — ABNORMAL LOW (ref >60)
GFR calc non Af Amer: 8 mL/min — ABNORMAL LOW (ref >60)
Glucose, Bld: 124 mg/dL — ABNORMAL HIGH (ref 70–99)
Phosphorus: 5.8 mg/dL — ABNORMAL HIGH (ref 2.5–4.6)
Potassium: 3.9 mmol/L (ref 3.5–5.1)
Sodium: 132 mmol/L — ABNORMAL LOW (ref 135–145)

## 2019-11-30 LAB — BASIC METABOLIC PANEL
Anion gap: 11 (ref 5–15)
BUN: 31 mg/dL — ABNORMAL HIGH (ref 6–20)
CO2: 25 mmol/L (ref 22–32)
Calcium: 7.5 mg/dL — ABNORMAL LOW (ref 8.9–10.3)
Chloride: 99 mmol/L (ref 98–111)
Creatinine, Ser: 2.74 mg/dL — ABNORMAL HIGH (ref 0.61–1.24)
GFR calc Af Amer: 30 mL/min — ABNORMAL LOW (ref >60)
GFR calc non Af Amer: 26 mL/min — ABNORMAL LOW (ref >60)
Glucose, Bld: 106 mg/dL — ABNORMAL HIGH (ref 70–99)
Potassium: 3.3 mmol/L — ABNORMAL LOW (ref 3.5–5.1)
Sodium: 135 mmol/L (ref 135–145)

## 2019-11-30 LAB — MAGNESIUM: Magnesium: 2 mg/dL (ref 1.7–2.4)

## 2019-11-30 LAB — HEPARIN LEVEL (UNFRACTIONATED): Heparin Unfractionated: 0.44 IU/mL (ref 0.30–0.70)

## 2019-11-30 MED ORDER — CALCIUM ACETATE (PHOS BINDER) 667 MG PO CAPS
667.0000 mg | ORAL_CAPSULE | Freq: Three times a day (TID) | ORAL | Status: DC
  Administered 2019-11-30 (×3): 667 mg via ORAL
  Filled 2019-11-30 (×3): qty 1

## 2019-11-30 NOTE — Progress Notes (Signed)
ANTICOAGULATION CONSULT NOTE  Pharmacy Consult for Heparin Indication: atrial fibrillation and pulmonary embolus  Patient Measurements: Height: 5' 10.5" (179.1 cm) Weight: 168 lb 6.9 oz (76.4 kg) IBW/kg (Calculated) : 74.15  Vital Signs: Temp: 97.6 F (36.4 C) (12/16 0343) Temp Source: Axillary (12/16 0343) BP: 102/84 (12/16 0700) Pulse Rate: 77 (12/16 0400)  Labs: Recent Labs    11/28/19 0401 11/29/19 0411 11/29/19 0632 11/29/19 2019 11/30/19 0359  HGB 9.0* 7.8*  --   --  7.8*  HCT 24.6* 21.5*  --   --  21.5*  PLT 243 278  --   --  398  HEPARINUNFRC 0.30 0.34  --   --  0.44  CREATININE 6.67*  --  5.91* 6.61* 7.13*    Estimated Creatinine Clearance: 12.9 mL/min (A) (by C-G formula based on SCr of 7.13 mg/dL (H)).  Assessment: 82 yoM with cardiac arrest complicated by VDRF and need for CRRT. Pt had intermittent AFib and concern for possible PE on CT chest, pharmacy consulted for IV heparin.  Heparin level therapeutic at 0.44, CBC down slightly.  Goal of Therapy:  Heparin level 0.3-0.7 units/ml  Monitor platelets by anticoagulation protocol: Yes   Plan:  -Continue heparin 1700 units/h -Recheck heparin level and CBC with morning labs   Arrie Senate, PharmD, BCPS Clinical Pharmacist [phone] Please check AMION for all [place] numbers 11/30/2019

## 2019-11-30 NOTE — Progress Notes (Signed)
Called to room by RN d/t pt desat 78-80% and increased WOB on ATC.  Placed pt back on full vent support, RN in room and aware, MD at bedside and aware.

## 2019-11-30 NOTE — Progress Notes (Signed)
[facility] ROUNDING NOTE   Subjective:   Last had HD on 12/14 with no UF.  He has been off of levo for night shift with most recent pressures in the 48'J systolic - they are about to start back.  Had been 85'U systolic per nursing.   Review of systems: limited secondary to vent; denies air hunger, nausea or vomiting   Background:  Is a 53 year old gentleman history of SVT systolic heart failure polysubstance abuse end-stage renal disease Monday Wednesday Friday dialysis [facility] kidney center status post PEA arrest 11/16/2019.  Occurred about 3-1/2 hours into hemodialysis treatment received 3 rounds of epinephrine 1 g of calcium 50 mEq of bicarbonate with ROSC of about 15 minutes.  Emergent cricothyroitomy performed by EMS due to failed intubation.  CRRT initiated 11/18/2019.     Objective:  Vital signs in last 24 hours:  Temp:  [97.6 F (36.4 C)-97.9 F (36.6 C)] 97.6 F (36.4 C) (12/16 0343) Pulse Rate:  [70-104] 77 (12/16 0400) Resp:  [12-29] 19 (12/16 0500) BP: (77-112)/(56-95) 78/64 (12/16 0500) SpO2:  [66 %-100 %] 97 % (12/16 0400) Arterial Line BP: (85-126)/(44-67) 119/67 (12/15 1715) FiO2 (%):  [30 %-40 %] 30 % (12/16 0400) Weight:  [76.4 kg] 76.4 kg (12/16 0500)  Weight change: 2.6 kg Filed Weights   11/28/19 0200 11/29/19 0400 11/30/19 0500  Weight: 72.9 kg 73.8 kg 76.4 kg    Intake/Output: I/O last 3 completed shifts: In: 1878.1 [I.V.:1409.1; NG/GT:469] Out: 1 [Stool:1]   Intake/Output this shift:  Total I/O In: 881 [I.V.:332.4; NG/GT:460; IV Piggyback:88.6] Out: -   Exam General adult male in bed awake on arrival; he is on vent via trach HEENT normocephalic atraumatic extraocular movements intact sclera anicteric Lung coarse mechanical breath sounds on 30% fio2 peep 5  Heart s1s2 no rub Abdomen soft nontender mildly distended Extremities no edema appreciated  Psych no agitation Access LUE AVF bruit and thrill - tape/bandage is in  place  HD [facility]: [facility]  4h 57mn  74.5kg  2/2.5 bath  340/A1.5   P4  Hep 5000     C3 0.75 qTx     no ESA/Fe     Hb 12s usually  CXR 12/8 - no chg bilat layering effusion and IS/ alveloar infiltrates   Assessment:   ESRD- MWF HD.  Started on CRRT 11/19/2019.  [place]    - Had HD on 12/14 PM. Note temp line removed due to leukocytosis.   - HD today, 12/16 with gentle UF as tolerated  - Please avoid applying tape directly to any scabs overlying AVF    Status post arrest x 2- PEA at OP HD on 12/2, 14m until ROSC.  ECHO LV = 20% ejection fraction. Seen by EP team. Then VT arrest on 12/8, recovered.   New onset atrial fibrillation CHA2DS2-VASc score 3 - per primary   Chronic combined congestive heart failure:  EF was 30% , now < 20% not a candidate for ICD due to noncompliance and drug abuse  Prolonged QT - noted   Cardiogenic shock: pressors per primary team.  On midodrine 10 tid.  At OP HD pt's BP's are normal and pt on coreg and Imdur at home  ANEMIA- CKD and acute decline as well.  aranesp 100 mcg on 12/15.    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  Added back reduced dose of home calcium acetate   ACCESS-AV fistula  Diffuse nonspecific encephalopathy EEG performed   Emergent cricothyroidotomy /  acute respiratory failure- per CCM  History of polysubstance abuse  CT scan of chest with suspicion for pulmonary embolus - per primary team    Basic Metabolic Panel: Recent Labs  Lab 11/24/19 0324 11/25/19 0359 11/25/19 0408 11/27/19 0341 11/28/19 0401 11/29/19 0632 11/29/19 2019 11/30/19 0359  NA 135 137  --  133* 130* 134* 133* 132*  K 4.3 4.1  --  3.8 4.0 4.0 3.6 3.9  CL 102 101   < > 98 96* 99 97* 96*  CO2 21* 21*   < > 18* 15* 18* 18* 18*  GLUCOSE 116* 117*   < > 112* 109* 129* 140* 124*  BUN 25* 21*   < > 62* 79* 67* 81* 88*  CREATININE 1.86* 1.71*   < > 5.25* 6.67* 5.91* 6.61* 7.13*  CALCIUM 7.9* 8.0*   < > 7.8* 7.7* 7.6* 7.7* 7.8*  MG 2.5*  2.5*  --  2.8* 2.8*  --  2.6*  --   PHOS 1.5* 3.0  --  3.7 5.2* 5.0*  --  5.8*   < > = values in this interval not displayed.    Liver Function Tests: Recent Labs  Lab 11/24/19 0324 11/24/19 1632 11/25/19 0359 11/29/19 0632 11/30/19 0359  ALBUMIN 2.3* 2.1* 2.3* 2.0* 2.1*   No results for input(s): LIPASE, AMYLASE in the last 168 hours. No results for input(s): AMMONIA in the last 168 hours.  CBC: Recent Labs  Lab 11/26/19 0406 11/27/19 0341 11/28/19 0401 11/29/19 0411 11/30/19 0359  WBC 20.2* 19.6* 16.7* 14.4* 15.0*  NEUTROABS 16.6* 16.0* 13.9* 12.9* 11.9*  HGB 10.0* 9.8* 9.0* 7.8* 7.8*  HCT 28.1* 28.0* 24.6* 21.5* 21.5*  MCV 85.2 85.1 81.2 78.5* 80.5  PLT 161 195 243 278 398    Cardiac Enzymes: No results for input(s): CKTOTAL, CKMB, CKMBINDEX, TROPONINI in the last 168 hours.  BNP: Invalid input(s): POCBNP  CBG: Recent Labs  Lab 11/29/19 2006 11/29/19 2339 11/30/19 0012 11/30/19 0341 11/30/19 0402  GLUCAP 76 66* 150* 93 107*     Medications:   . sodium chloride 10 mL/hr at 11/30/19 0500  . feeding supplement (VITAL 1.5 CAL) 40 mL/hr at 11/30/19 0300  . heparin 1,700 Units/hr (11/30/19 0500)  . norepinephrine (LEVOPHED) Adult infusion Stopped (11/29/19 1608)   . amiodarone  200 mg Oral Daily  . B-complex with vitamin C  1 tablet Per Tube Daily  . chlorhexidine gluconate (MEDLINE KIT)  15 mL Mouth Rinse BID  . Chlorhexidine Gluconate Cloth  6 each Topical Daily  . Chlorhexidine Gluconate Cloth  6 each Topical Q0600  . Chlorhexidine Gluconate Cloth  6 each Topical Q0600  . docusate  100 mg Per Tube Daily  . feeding supplement (PRO-STAT SUGAR FREE 64)  30 mL Per Tube TID  . hydrocortisone  10 mg Oral BID  . mouth rinse  15 mL Mouth Rinse 10 times per day  . midodrine  10 mg Per Tube Q8H  . pantoprazole sodium  40 mg Per Tube QHS  . sodium chloride flush  10-40 mL Intracatheter Q12H  . white petrolatum   Topical Daily   sodium chloride,  acetaminophen (TYLENOL) oral liquid 160 mg/5 mL, acetaminophen, fentaNYL (SUBLIMAZE) injection, oxyCODONE, prochlorperazine, sodium chloride flush     Claudia Desanctis 11/30/2019 5:41 AM

## 2019-11-30 NOTE — Progress Notes (Signed)
NAME:  [name] [name], MRN:  [id number], DOB:  [birth year], LOS: 61 ADMISSION DATE:  11/16/2019, CONSULTATION DATE:  12/2 REFERRING MD:  Dr. Sedonia Small, CHIEF COMPLAINT:  Post cardiac arrest    Brief History   53yo male presented post PEA arrest which occurred during iHD treatment as outpatient. This was a witnessed arrest with quick bystander CPR. Received 3 rounds of epi, 1g of calcium and 50 mEq of bicarb with ROSC after 15 minutes.   Emergent cricothyrotomy performed by EMS due to failed intubation with [facility] airway.   Past Medical History  NSVT 2017 Nonischemic cardiomyopathy Medical noncompliance Polysubstance abuse Hypertension End-stage renal disease Combined systolic and diastolic congestive heart failure Cocaine abuse Alcohol abuse Prolonged QTC  Significant Hospital Events   12/2 Admitted post cardiac arrest, emergent cric, 33 C TTM 12/8 V. tach arrest 12/10 Cric changed to tracheostomy  Consults:  Nephrology Electrophysiology ENT  Procedures:  12/10-trach  Significant Diagnostic Tests:  CTA 12/2-segmental filling defects in the right middle lobe and right lower lobe pulmonary arteries, pneumomediastinum, subcu emphysema CT head 12/8-no acute abnormality  Echocardiogram 11/17/19-LVEF less than 20%, normal LV systolic function  97/98 KUB: Bowel gas pattern c/w ileus, mildly improved from prior imaging.   Micro Data:  COVID 12/2 > negative Aspirate 12/04+ for Haemophilus influenza all/2 blood cultures no growth to date  Antimicrobials:  Unasyn 12/4>>12/7 Ceftriazone 12/7>>12/13  Interim history/subjective:   Critically ill, with trach Off pressors Weaning on pressure support 12/5 Afebrile Last HD was 12/14 Complains of pain in both legs, has history of gout  Objective   Blood pressure (!) 79/63, pulse 79, temperature (!) 96.4 F (35.8 C), temperature source Axillary, resp. rate 14, height 5' 10.5" (1.791 m), weight 76.4 kg, SpO2 97 %. CVP:  [19 mmHg-22  mmHg] 22 mmHg  Vent Mode: PSV;CPAP FiO2 (%):  [30 %-40 %] 40 % Set Rate:  [20 bmp] 20 bmp Vt Set:  [590 mL] 590 mL PEEP:  [5 cmH20] 5 cmH20 Pressure Support:  [8 XQJ19-41 cmH20] 12 cmH20 Plateau Pressure:  [14 cmH20-24 cmH20] 14 cmH20   Intake/Output Summary (Last 24 hours) at 11/30/2019 0948 Last data filed at 11/30/2019 7408 Gross per 24 hour  Intake 1833.29 ml  Output --  Net 1833.29 ml   Filed Weights   11/28/19 0200 11/29/19 0400 11/30/19 0500  Weight: 72.9 kg 73.8 kg 76.4 kg    Examination: GEN: Acutely ill appearing man lying supine HEENT: trach in place , mild secretions, mild pallor, no icterus CV: regular, S1-S2 PULM: Decreased breath sounds bilateral, no accessory muscle use, on pressure support wean GI: Abdomen remains distended, active bowel sounds, nontender EXT: + muscle wasting, trace edema, tender both legs, ankles are not warm NEURO: Moves all 4 extremities to command PSYCH: RASS 0 to +1 SKIN: No rashes, LUE AV fistula with good thrill  Labs show persistent leukocytosis and anemia, mild hyponatremia, normal electrolytes. Chest x-ray 12/14 personally reviewed which shows bilateral layering pleural effusions with cardiomegaly  Resolved Hospital Problem list     Assessment & Plan:  # Cardiac arrest- possible PE but contrast timing poor, Vfib rhythm,  Combined systolic and diastolic CHF, NSVT, prolonged QT, and polysubstance abuse   -ct amiodarone.  -On IV heparin , hemoglobin dropped from 9-7.8 on 12/15 but stable, continue to monitor -Obtain venous duplex for completion, doubt PE, if negative may have to consider repeat CT angio to clarify need for anticoagulation going forward. -Not a candidate for ICD per EP -we  will have to readdress ICD versus LifeVest   # Shock - related to bad heart, renal vasoplegia, persistent -On empiric hydrocortisone  #Acute respiratory failure, difficult intubation- crich now trach  -Tolerating pressure support,  progress to trach collar -Then mobilize and progressed to Passy-Muir valve  # ESRD-intermittent HD per renal  # Hx polysustance abuse- cocaine, alcohol, tobacco -Fentanyl as needed  # Ileus- persistent     Best practice:  Diet: TFs Pain/Anxiety/Delirium protocol (if indicated): see above VAP protocol (if indicated): yes, ordered DVT prophylaxis: heparin gtt GI prophylaxis: PPI Glucose control: yes less than 180 Foley: none Mobility: bed rest Code Status: Full Family Communication: son Disposition: ICU   The patient is critically ill with multiple organ systems failure and requires high complexity decision making for assessment and support, frequent evaluation and titration of therapies, application of advanced monitoring technologies and extensive interpretation of multiple databases. Critical Care Time devoted to patient care services described in this note independent of APP/resident  time is 35 minutes.    Kara Mead MD. [facility]. [facility]  If no response to pager , please call [phone]   11/30/2019

## 2019-11-30 NOTE — Evaluation (Signed)
Physical Therapy Evaluation Patient Details Name: [name] [name] MRN: [id number] DOB: [birth year] Today's Date: 11/30/2019   History of Present Illness  53yo male presented post PEA arrest which occurred during iHD treatment as outpatient. This was a witnessed arrest with quick bystander CPR. Emergent cricothyrotomy performed by EMS due to failed intubation with [facility] airway. PMH significant for polysubstance abuse, HTN, ESRD, CHF, nonischemic cardiomyopathy. Trach placed on 12/10.  Clinical Impression  Pt presents to PT with deficits in strength, power, functional mobility, gair, balance, and endurance. Pt generally weak, requiring assistance for sitting balance and significant assistance for all functional mobility at this time. Pt remains ventilated through trach at time of evaluation and is noted to have increased RR into high 40s with mobility. Pt will benefit from continued acute PT POC to improve strength and mobility while also reducing physical assistance requirements during mobility. PT recommends high intensity inpatient Pt services at time of discharge to aide in a return toward the pt's PLOF.    Follow Up Recommendations CIR;Supervision/Assistance - 24 hour    Equipment Recommendations  (defer to post-acute setting)    Recommendations for Other Services OT consult;Rehab consult     Precautions / Restrictions Precautions Precautions: Fall Precaution Comments: Ventilated Restrictions Weight Bearing Restrictions: No      Mobility  Bed Mobility Overal bed mobility: Needs Assistance Bed Mobility: Supine to Sit     Supine to sit: Max assist;HOB elevated        Transfers Overall transfer level: Needs assistance Equipment used: 2 person hand held assist Transfers: Stand Pivot Transfers   Stand pivot transfers: Max assist       General transfer comment: pt performs bed to [facility] and BSC to recliner transfers with maxA  Ambulation/Gait                Stairs             Wheelchair Mobility    Modified Rankin (Stroke Patients Only)       Balance Overall balance assessment: Needs assistance Sitting-balance support: Bilateral upper extremity supported;Feet supported Sitting balance-Leahy Scale: Poor Sitting balance - Comments: minG-modA, increased sway Postural control: Posterior lean;Right lateral lean;Left lateral lean Standing balance support: Bilateral upper extremity supported Standing balance-Leahy Scale: Poor Standing balance comment: mod-maxA during SPT                             Pertinent Vitals/Pain Pain Assessment: Faces Faces Pain Scale: Hurts whole lot Pain Location: BLE, R>L Pain Descriptors / Indicators: Aching Pain Intervention(s): Limited activity within patient's tolerance    Home Living Family/patient expects to be discharged to:: Private residence Living Arrangements: Parent Available Help at Discharge: Family;Available 24 hours/day Type of Home: House Home Access: Stairs to enter Entrance Stairs-Rails: Can reach both Entrance Stairs-Number of Steps: 2 Home Layout: One level Home Equipment: None      Prior Function Level of Independence: Independent               Hand Dominance        Extremity/Trunk Assessment   Upper Extremity Assessment Upper Extremity Assessment: Generalized weakness    Lower Extremity Assessment Lower Extremity Assessment: Generalized weakness    Cervical / Trunk Assessment Cervical / Trunk Assessment: Kyphotic  Communication   Communication: Tracheostomy;Other (comment)(vent)  Cognition Arousal/Alertness: Awake/alert Behavior During Therapy: WFL for tasks assessed/performed Overall Cognitive Status: Difficult to assess  General Comments: difficult to assess 2/2 nonverbal, pt is A/Ox4, responds appropriately to PT commands and questions      General Comments General comments (skin integrity, edema,  etc.): VSS, pt on vent, RR intermittently increasing to upper 40s during mobility but pt recovers quickly, RN present and assisting with line management and mobility    Exercises     Assessment/Plan    PT Assessment Patient needs continued PT services  PT Problem List Decreased strength;Decreased activity tolerance;Decreased balance;Decreased mobility;Decreased knowledge of use of DME;Decreased safety awareness;Decreased knowledge of precautions;Cardiopulmonary status limiting activity;Pain       PT Treatment Interventions DME instruction;Stair training;Gait training;Functional mobility training;Therapeutic activities;Therapeutic exercise;Balance training;Neuromuscular re-education;Patient/family education    PT Goals (Current goals can be found in the Care Plan section)  Acute Rehab PT Goals Patient Stated Goal: To improve mobility PT Goal Formulation: With patient Time For Goal Achievement: 12/14/19 Potential to Achieve Goals: Fair    Frequency Min 3X/week   Barriers to discharge        Co-evaluation               AM-PAC PT "6 Clicks" Mobility  Outcome Measure Help needed turning from your back to your side while in a flat bed without using bedrails?: A Lot Help needed moving from lying on your back to sitting on the side of a flat bed without using bedrails?: Total Help needed moving to and from a bed to a chair (including a wheelchair)?: Total Help needed standing up from a chair using your arms (e.g., wheelchair or bedside chair)?: Total Help needed to walk in hospital room?: Total Help needed climbing 3-5 steps with a railing? : Total 6 Click Score: 7    End of Session Equipment Utilized During Treatment: Oxygen Activity Tolerance: Patient tolerated treatment well Patient left: in chair;with call bell/phone within reach;with chair alarm set;with nursing/sitter in room Nurse Communication: Mobility status PT Visit Diagnosis: Unsteadiness on feet (R26.81);Muscle  weakness (generalized) (M62.81)    Time: 2703-5009 PT Time Calculation (min) (ACUTE ONLY): 29 min   Charges:   PT Evaluation $PT Eval High Complexity: 1 High PT Treatments $Therapeutic Activity: 8-22 mins        Zenaida Niece, PT, DPT Acute Rehabilitation Pager: [phone]   Zenaida Niece 11/30/2019, 1:32 PM

## 2019-11-30 NOTE — Progress Notes (Signed)
Rehab Admissions Coordinator Note:  Per PT recommendation, patient was screened by Michel Santee, PT, DPT for appropriateness for an Inpatient Acute Rehab Consult.  At this time, note pt still requiring full vent support.  Will follow from a distance.   Michel Santee, PT, DPT 11/30/2019, 1:55 PM  I can be reached at [phone].

## 2019-11-30 NOTE — Progress Notes (Signed)
Lower extremity venous has been completed.   Preliminary results in CV Proc.   Abram Sander 11/30/2019 2:31 PM

## 2019-11-30 NOTE — Progress Notes (Signed)
Sat 86-88% on 40% fio2.  Fio2 increased to 50%, sat now 92%.  No distress noted, VSS currently.

## 2019-12-01 LAB — RENAL FUNCTION PANEL
Albumin: 2.2 g/dL — ABNORMAL LOW (ref 3.5–5.0)
Anion gap: 16 — ABNORMAL HIGH (ref 5–15)
BUN: 46 mg/dL — ABNORMAL HIGH (ref 6–20)
CO2: 24 mmol/L (ref 22–32)
Calcium: 7.5 mg/dL — ABNORMAL LOW (ref 8.9–10.3)
Chloride: 96 mmol/L — ABNORMAL LOW (ref 98–111)
Creatinine, Ser: 4.41 mg/dL — ABNORMAL HIGH (ref 0.61–1.24)
GFR calc Af Amer: 17 mL/min — ABNORMAL LOW (ref >60)
GFR calc non Af Amer: 14 mL/min — ABNORMAL LOW (ref >60)
Glucose, Bld: 128 mg/dL — ABNORMAL HIGH (ref 70–99)
Phosphorus: 2.3 mg/dL — ABNORMAL LOW (ref 2.5–4.6)
Potassium: 3.8 mmol/L (ref 3.5–5.1)
Sodium: 136 mmol/L (ref 135–145)

## 2019-12-01 LAB — HEMOGLOBIN AND HEMATOCRIT, BLOOD
HCT: 22 % — ABNORMAL LOW (ref 39.0–52.0)
Hemoglobin: 7.7 g/dL — ABNORMAL LOW (ref 13.0–17.0)

## 2019-12-01 LAB — CBC WITH DIFFERENTIAL/PLATELET
Abs Immature Granulocytes: 0.7 10*3/uL — ABNORMAL HIGH (ref 0.00–0.07)
Basophils Absolute: 0 10*3/uL (ref 0.0–0.1)
Basophils Relative: 0 %
Eosinophils Absolute: 0.1 10*3/uL (ref 0.0–0.5)
Eosinophils Relative: 1 %
HCT: 19.4 % — ABNORMAL LOW (ref 39.0–52.0)
Hemoglobin: 6.9 g/dL — CL (ref 13.0–17.0)
Immature Granulocytes: 5 %
Lymphocytes Relative: 10 %
Lymphs Abs: 1.2 10*3/uL (ref 0.7–4.0)
MCH: 29.2 pg (ref 26.0–34.0)
MCHC: 35.6 g/dL (ref 30.0–36.0)
MCV: 82.2 fL (ref 80.0–100.0)
Monocytes Absolute: 1.1 10*3/uL — ABNORMAL HIGH (ref 0.1–1.0)
Monocytes Relative: 8 %
Neutro Abs: 10 10*3/uL — ABNORMAL HIGH (ref 1.7–7.7)
Neutrophils Relative %: 76 %
Platelets: 421 10*3/uL — ABNORMAL HIGH (ref 150–400)
RBC: 2.36 MIL/uL — ABNORMAL LOW (ref 4.22–5.81)
RDW: 18.5 % — ABNORMAL HIGH (ref 11.5–15.5)
WBC: 13.1 10*3/uL — ABNORMAL HIGH (ref 4.0–10.5)
nRBC: 2.9 % — ABNORMAL HIGH (ref 0.0–0.2)

## 2019-12-01 LAB — GLUCOSE, CAPILLARY
Glucose-Capillary: 118 mg/dL — ABNORMAL HIGH (ref 70–99)
Glucose-Capillary: 118 mg/dL — ABNORMAL HIGH (ref 70–99)
Glucose-Capillary: 119 mg/dL — ABNORMAL HIGH (ref 70–99)
Glucose-Capillary: 136 mg/dL — ABNORMAL HIGH (ref 70–99)
Glucose-Capillary: 79 mg/dL (ref 70–99)
Glucose-Capillary: 86 mg/dL (ref 70–99)

## 2019-12-01 LAB — PREPARE RBC (CROSSMATCH)

## 2019-12-01 LAB — HEPARIN LEVEL (UNFRACTIONATED): Heparin Unfractionated: 0.24 IU/mL — ABNORMAL LOW (ref 0.30–0.70)

## 2019-12-01 MED ORDER — POTASSIUM CHLORIDE 20 MEQ/15ML (10%) PO SOLN
20.0000 meq | Freq: Once | ORAL | Status: AC
  Administered 2019-12-01: 20 meq
  Filled 2019-12-01: qty 15

## 2019-12-01 MED ORDER — AMIODARONE HCL 200 MG PO TABS
200.0000 mg | ORAL_TABLET | Freq: Every day | ORAL | Status: DC
  Administered 2019-12-01 – 2019-12-02 (×2): 200 mg
  Filled 2019-12-01 (×2): qty 1

## 2019-12-01 MED ORDER — CHLORHEXIDINE GLUCONATE CLOTH 2 % EX PADS
6.0000 | MEDICATED_PAD | Freq: Every day | CUTANEOUS | Status: DC
  Administered 2019-12-03 – 2019-12-09 (×8): 6 via TOPICAL

## 2019-12-01 MED ORDER — SODIUM CHLORIDE 0.9% IV SOLUTION: Freq: Once | INTRAVENOUS | Status: AC

## 2019-12-01 MED ORDER — VITAL 1.5 CAL PO LIQD
1000.0000 mL | ORAL | Status: DC
  Administered 2019-12-04 – 2019-12-14 (×10): 1000 mL
  Filled 2019-12-01 (×17): qty 1000

## 2019-12-01 MED ORDER — CALCIUM GLUCONATE-NACL 1-0.675 GM/50ML-% IV SOLN
1.0000 g | Freq: Once | INTRAVENOUS | Status: AC
  Administered 2019-12-01: 1000 mg via INTRAVENOUS
  Filled 2019-12-01: qty 50

## 2019-12-01 MED ORDER — ALBUMIN HUMAN 25 % IV SOLN
12.5000 g | Freq: Once | INTRAVENOUS | Status: AC
  Administered 2019-12-01: 12.5 g via INTRAVENOUS
  Filled 2019-12-01: qty 50

## 2019-12-01 NOTE — Progress Notes (Signed)
CRITICAL VALUE ALERT  Critical Value:  Hgb 6.9  Date & Time Notied:  12/01/19 4:50 AM   Provider Notified: Warren Lacy   Orders Received/Actions taken: Awaiting orders

## 2019-12-01 NOTE — Evaluation (Signed)
Occupational Therapy Evaluation Patient Details Name: [name] [name] MRN: [id number] DOB: Feb 13, 1966 Today's Date: 12/01/2019    History of Present Illness 53yo male presented post PEA arrest which occurred during iHD treatment as outpatient. This was a witnessed arrest with quick bystander CPR. Emergent cricothyrotomy performed by EMS due to failed intubation with [facility] airway. PMH significant for polysubstance abuse, HTN, ESRD, CHF, nonischemic cardiomyopathy. Trach placed on 12/10.   Clinical Impression   PTA patient reports independent with ADLs, mobility, IADLs and driving.  Admitted for above and limited by problem listed below, including generalized weakness, impaired balance, decreased activity tolerance. Patient currently requires setup/supervision for grooming, min-mod assist for UB ADLs and max-total assist for LB ADLs, mod assist +2 for transfers.  Pt on vent during session with SPo2 dropping to 78% when standing and RR increased to 40, but recovers quickly when seated; vent setting listed below. Patient will benefit from continued OT services while admitted and after dc at CIR level in order to optimize independence and return to PLOF with ADLs, mobility prior to dc home.     Follow Up Recommendations  CIR;Supervision/Assistance - 24 hour    Equipment Recommendations  3 in 1 bedside commode    Recommendations for Other Services Rehab consult     Precautions / Restrictions Precautions Precautions: Fall Precaution Comments: Ventilated Restrictions Weight Bearing Restrictions: No      Mobility Bed Mobility               General bed mobility comments: OOB in recliner upon entry  Transfers Overall transfer level: Needs assistance Equipment used: 2 person hand held assist Transfers: Sit to/from Stand Sit to Stand: Mod assist;+2 physical assistance;+2 safety/equipment         General transfer comment: mod assist +2 from recliner to power up and steady given  cueing for hand placement and safety    Balance Overall balance assessment: Needs assistance Sitting-balance support: No upper extremity supported;Feet supported Sitting balance-Leahy Scale: Fair Sitting balance - Comments: min guard seated unsupported in recliner, limited dynamic balance   Standing balance support: Bilateral upper extremity supported;During functional activity Standing balance-Leahy Scale: Poor Standing balance comment: relaint on BUE and external support                           ADL either performed or assessed with clinical judgement   ADL Overall ADL's : Needs assistance/impaired     Grooming: Wash/dry face;Oral care;Set up;Sitting;Supervision/safety Grooming Details (indicate cue type and reason): setup in chair, using suction oral care  Upper Body Bathing: Minimal assistance;Sitting   Lower Body Bathing: Maximal assistance;Sit to/from stand Lower Body Bathing Details (indicate cue type and reason): +2 mod assist sit to stand; unable to reach feet Upper Body Dressing : Moderate assistance;Sitting   Lower Body Dressing: Total assistance;Sit to/from stand;+2 for physical assistance     Toilet Transfer Details (indicate cue type and reason): sit to stand from recliner with mod assist +2, did not pivot today         Functional mobility during ADLs: Moderate assistance;+2 for physical assistance;+2 for safety/equipment;Rolling walker;Cueing for sequencing;Cueing for safety General ADL Comments: pt limited by decreased activity tolerance, endurance and generalized weakness      Vision Baseline Vision/History: No visual deficits Vision Assessment?: No apparent visual deficits     Perception     Praxis      Pertinent Vitals/Pain Pain Assessment: 0-10 Faces Pain Scale: Hurts whole lot Pain  Location: B LEs  Pain Descriptors / Indicators: Aching Pain Intervention(s): Limited activity within patient's tolerance;Monitored during  session;Repositioned     Hand Dominance Right   Extremity/Trunk Assessment Upper Extremity Assessment Upper Extremity Assessment: Generalized weakness   Lower Extremity Assessment Lower Extremity Assessment: Defer to PT evaluation   Cervical / Trunk Assessment Cervical / Trunk Assessment: Kyphotic   Communication Communication Communication: Tracheostomy;Other (comment)(vent)   Cognition Arousal/Alertness: Awake/alert Behavior During Therapy: WFL for tasks assessed/performed Overall Cognitive Status: Difficult to assess                                 General Comments: pt on vent via trach, engages appropriately    General Comments  pt on vent during session with SpO2 maintained while sitting, dropped to 78% when standing and noted RR increased to 40; pt recovers quickly; Vent settings: PRVC, PEEP 5, 40% FiO2    Exercises     Shoulder Instructions      Home Living Family/patient expects to be discharged to:: Private residence Living Arrangements: Parent Available Help at Discharge: Family;Available 24 hours/day Type of Home: House Home Access: Stairs to enter [employer] of Steps: 2 Entrance Stairs-Rails: Can reach both Home Layout: One level     Bathroom Shower/Tub: Teacher, early years/pre: Standard     Home Equipment: None          Prior Functioning/Environment Level of Independence: Independent                 OT Problem List: Decreased strength;Decreased activity tolerance;Impaired balance (sitting and/or standing);Decreased knowledge of use of DME or AE;Decreased knowledge of precautions;Cardiopulmonary status limiting activity;Pain      OT Treatment/Interventions: Self-care/ADL training;DME and/or AE instruction;Therapeutic exercise;Therapeutic activities;Balance training;Patient/family education    OT Goals(Current goals can be found in the care plan section) Acute Rehab OT Goals Patient Stated Goal: to get  stronger OT Goal Formulation: With patient Time For Goal Achievement: 12/15/19 Potential to Achieve Goals: Good  OT Frequency: Min 2X/week   Barriers to D/C:            Co-evaluation PT/OT/SLP Co-Evaluation/Treatment: Yes Reason for Co-Treatment: Complexity of the patient's impairments (multi-system involvement)   OT goals addressed during session: ADL's and self-care      AM-PAC OT "6 Clicks" Daily Activity     Outcome Measure Help from another person eating meals?: Total Help from another person taking care of personal grooming?: A Little Help from another person toileting, which includes using toliet, bedpan, or urinal?: A Lot Help from another person bathing (including washing, rinsing, drying)?: A Lot Help from another person to put on and taking off regular upper body clothing?: A Lot Help from another person to put on and taking off regular lower body clothing?: Total 6 Click Score: 11   End of Session Equipment Utilized During Treatment: Rolling walker;Other (comment)(vent ) Nurse Communication: Mobility status  Activity Tolerance: Patient tolerated treatment well Patient left: in chair;with call bell/phone within reach;Other (comment)(PT in room)  OT Visit Diagnosis: Other abnormalities of gait and mobility (R26.89);Muscle weakness (generalized) (M62.81);Pain Pain - part of body: (B LE)                Time: 0102-7253 OT Time Calculation (min): 19 min Charges:  OT General Charges $OT Visit: 1 Visit OT Evaluation $OT Eval High Complexity: 1 High  Jolaine Artist, OT Acute Rehabilitation Services Pager [phone] Office [phone]  Delight Stare 12/01/2019, 8:38 AM

## 2019-12-01 NOTE — Progress Notes (Signed)
Physical Therapy Treatment Patient Details Name: [name] [name] MRN: [id number] DOB: 1966-04-19 Today's Date: 12/01/2019    History of Present Illness 53yo male presented post PEA arrest which occurred during iHD treatment as outpatient. This was a witnessed arrest with quick bystander CPR. Emergent cricothyrotomy performed by EMS due to failed intubation with [facility] airway. PMH significant for polysubstance abuse, HTN, ESRD, CHF, nonischemic cardiomyopathy. Trach placed on 12/10.    PT Comments    Pt tolerated treatment well despite desaturation during standing. Pt progress with therapy has been limited by oxygen needs and ventilator dependency at this time. Pt demonstrates some improvement in transfer technique, although continues to require physical assistance. Pt will continue to benefit from acute PT POC to improve strength and activity tolerance.   Follow Up Recommendations  CIR;Supervision/Assistance - 24 hour     Equipment Recommendations  (defer to post-acute)    Recommendations for Other Services       Precautions / Restrictions Precautions Precautions: Fall Precaution Comments: Ventilated Restrictions Weight Bearing Restrictions: No    Mobility  Bed Mobility Overal bed mobility: (pt receive and left in recliner)             General bed mobility comments: OOB in recliner upon entry  Transfers Overall transfer level: Needs assistance Equipment used: Rolling walker (2 wheeled) Transfers: Sit to/from Stand Sit to Stand: Mod assist;+2 physical assistance         General transfer comment: mod assist +2 from recliner to power up and steady given cueing for hand placement and safety  Ambulation/Gait                 Stairs             Wheelchair Mobility    Modified Rankin (Stroke Patients Only)       Balance Overall balance assessment: Needs assistance Sitting-balance support: Single extremity supported;Feet supported Sitting  balance-Leahy Scale: Fair Sitting balance - Comments: minG with support of recliner arm rest Postural control: Left lateral lean(offloading R hip) Standing balance support: Bilateral upper extremity supported Standing balance-Leahy Scale: Fair Standing balance comment: relaint on BUE and external support                            Cognition Arousal/Alertness: Awake/alert Behavior During Therapy: WFL for tasks assessed/performed Overall Cognitive Status: Difficult to assess                                 General Comments: pt on vent via trach, engages appropriately       Exercises General Exercises - Lower Extremity Ankle Circles/Pumps: AROM;Both;20 reps Gluteal Sets: AROM;Both;10 reps Long Arc Quad: AROM;Both;15 reps    General Comments General comments (skin integrity, edema, etc.): pt on vent during session, desaturating to 78% during brief period of standing with reports of SOB, peak RR of 40. Pt on 50% FiO2. Pt performing seated HEP without reports of SOB, unreliable pleth reading at this time but reading 100% SpO2 with adjustment of pleth.      Pertinent Vitals/Pain Pain Assessment: Faces Faces Pain Scale: Hurts even more Pain Location: R hip Pain Descriptors / Indicators: Aching Pain Intervention(s): Limited activity within patient's tolerance    Home Living Family/patient expects to be discharged to:: Private residence Living Arrangements: Parent Available Help at Discharge: Family;Available 24 hours/day Type of Home: House Home Access: Stairs to enter Entrance  Stairs-Rails: Can reach both Home Layout: One level Home Equipment: None      Prior Function Level of Independence: Independent          PT Goals (current goals can now be found in the care plan section) Acute Rehab PT Goals Patient Stated Goal: to get stronger Progress towards PT goals: Progressing toward goals(slowly, limited by ventilator dependency)    Frequency     Min 3X/week      PT Plan Current plan remains appropriate    Co-evaluation PT/OT/SLP Co-Evaluation/Treatment: Yes Reason for Co-Treatment: Complexity of the patient's impairments (multi-system involvement);For patient/therapist safety;To address functional/ADL transfers PT goals addressed during session: Mobility/safety with mobility;Balance;Strengthening/ROM;Proper use of DME OT goals addressed during session: ADL's and self-care      AM-PAC PT "6 Clicks" Mobility   Outcome Measure  Help needed turning from your back to your side while in a flat bed without using bedrails?: A Lot Help needed moving from lying on your back to sitting on the side of a flat bed without using bedrails?: Total Help needed moving to and from a bed to a chair (including a wheelchair)?: Total Help needed standing up from a chair using your arms (e.g., wheelchair or bedside chair)?: Total Help needed to walk in hospital room?: Total Help needed climbing 3-5 steps with a railing? : Total 6 Click Score: 7    End of Session Equipment Utilized During Treatment: Oxygen Activity Tolerance: Patient tolerated treatment well Patient left: in chair;with call bell/phone within reach;with nursing/sitter in room Nurse Communication: Mobility status PT Visit Diagnosis: Unsteadiness on feet (R26.81);Muscle weakness (generalized) (M62.81)     Time: 5537-4827 PT Time Calculation (min) (ACUTE ONLY): 31 min  Charges:  $Therapeutic Exercise: 8-22 mins                     Zenaida Niece, PT, DPT Acute Rehabilitation Pager: [phone]    Zenaida Niece 12/01/2019, 10:45 AM

## 2019-12-01 NOTE — TOC Initial Note (Signed)
Transition of Care [facility]) - Initial/Assessment Note    Patient Details  Name: [name] [name] MRN: [id number] Date of Birth: 09/10/1966  [facility]) CM/SW Contact:    Midge Minium MSN, RN, NCM-BC, ACM-RN [phone] Phone Number: 12/01/2019, 1:50 PM  Clinical Narrative:                 Patient remains critically ill with vent support. CM will continue to follow for transitional needs.   Expected Discharge Plan: [place] Barriers to Discharge: Continued Medical Work up(vent support)   Expected Discharge Plan and Services Expected Discharge Plan: [place]      Activities of Daily Living Home Assistive Devices/Equipment: None ADL Screening (condition at time of admission) Patient's cognitive ability adequate to safely complete daily activities?: Yes Is the patient deaf or have difficulty hearing?: No Does the patient have difficulty seeing, even when wearing glasses/contacts?: No Does the patient have difficulty concentrating, remembering, or making decisions?: No Patient able to express need for assistance with ADLs?: Yes Does the patient have difficulty dressing or bathing?: No Independently performs ADLs?: Yes (appropriate for developmental age) Does the patient have difficulty walking or climbing stairs?: No Weakness of Legs: None Weakness of Arms/Hands: None   Admission diagnosis:  Cardiac arrest [facility]) [I46.9] Tracheostomy in place [facility]) [Z93.0] Patient Active Problem List   Diagnosis Date Noted  . Pressure injury of skin 11/19/2019  . Acute respiratory failure ([place])   . ESRD on hemodialysis ([place])   . Cardiac arrest ([place]) 11/16/2019  . Paroxysmal atrial fibrillation ([facility])   . Arm pain, anterior, left 11/05/2018  . Nonischemic cardiomyopathy ([place]) 11/03/2018  . Essential hypertension 11/03/2018  . ESRD needing dialysis ([place]) 10/26/2018  . Prolonged Q-T interval on ECG 05/18/2017  . Hernia, inguinal, right  08/05/2016  . Acute on chronic systolic CHF (congestive heart failure) ([place])   . Solitary pulmonary nodule 10/10/2015  . Chronic combined systolic and diastolic heart failure ([place]) 04/18/2014  . Anemia due to blood loss, acute 07/15/2012  . Hypocalcemia 07/15/2012  . Cigarette smoker 11/26/2010   PCP:  Jean Rosenthal, MD Pharmacy:   [place] - [address] [place] [address] Phone: [phone] Fax: [phone]     Social Determinants of Health (SDOH) Interventions    Readmission Risk Interventions No flowsheet data found.

## 2019-12-01 NOTE — Progress Notes (Signed)
[place] Progress Note Patient Name: [name] [name] DOB: 1966-02-14 MRN: [id number]   Date of Service  12/01/2019  HPI/Events of Note  K+ - 3.3 and Creatinine = 2.74. Patient is on HD, therefore, will not replace K+. Ca++ = 7.5 which corrects to 9.02 (normal) given albumin = 2.1.  eICU Interventions       Intervention Category Major Interventions: Electrolyte abnormality - evaluation and management  Rayssa Atha [place] 12/01/2019, 12:02 AM

## 2019-12-01 NOTE — Progress Notes (Signed)
NAME:  [name] [name], MRN:  [id number], DOB:  02/16/1966, LOS: 27 ADMISSION DATE:  11/16/2019, CONSULTATION DATE:  12/2 REFERRING MD:  Dr. Sedonia Small, CHIEF COMPLAINT:  Post cardiac arrest    Brief History   53yo male presented post PEA arrest which occurred during iHD treatment as outpatient. This was a witnessed arrest with quick bystander CPR. Received 3 rounds of epi, 1g of calcium and 50 mEq of bicarb with ROSC after 15 minutes.   Emergent cricothyrotomy performed by EMS due to failed intubation with [facility] airway.   Past Medical History  NSVT 2017 Nonischemic cardiomyopathy Medical noncompliance Polysubstance abuse Hypertension End-stage renal disease Combined systolic and diastolic congestive heart failure Cocaine abuse Alcohol abuse Prolonged QTC  Significant Hospital Events   12/2 Admitted post cardiac arrest, emergent cric, 33 C TTM 12/8 V. tach arrest 12/10 Cric changed to tracheostomy 12/16 HD, tolerated only 30 minutes of trach collar  Consults:  Nephrology Electrophysiology ENT  Procedures:  12/10-trach 12/4 RT fem HD cath >> out 12/2 RT [place] CV[facility] >>  Significant Diagnostic Tests:  CTA 12/2-segmental filling defects in the right middle lobe and right lower lobe pulmonary arteries, pneumomediastinum, subcu emphysema CT head 12/8-no acute abnormality  Echocardiogram 11/17/19-LVEF less than 20%, normal LV systolic function  22/02 KUB: Bowel gas pattern c/w ileus, mildly improved from prior imaging.   Micro Data:  COVID 12/2 > negative Aspirate 12/04+ for Haemophilus influenza  12/11 Bc >> ng  Antimicrobials:  Unasyn 12/4>>12/7 Ceftriazone 12/7>>12/13  Interim history/subjective:   Underwent HD yesterday Remains critically ill, on vent Soft blood pressure today Afebrile  Objective   Blood pressure (!) 80/62, pulse 88, temperature 98.4 F (36.9 C), temperature source Oral, resp. rate (!) 21, height 5' 10.5" (1.791 m), weight 74.3 kg, SpO2 100 %. CVP:  [0  mmHg-23 mmHg] 16 mmHg  Vent Mode: PRVC FiO2 (%):  [40 %-50 %] 40 % Set Rate:  [20 bmp] 20 bmp Vt Set:  [590 mL] 590 mL PEEP:  [5 cmH20] 5 cmH20 Pressure Support:  [12 cmH20] 12 cmH20 Plateau Pressure:  [19 cmH20-20 cmH20] 19 cmH20   Intake/Output Summary (Last 24 hours) at 12/01/2019 0953 Last data filed at 12/01/2019 0600 Gross per 24 hour  Intake 1137.49 ml  Output 1300 ml  Net -162.51 ml   Filed Weights   11/30/19 1914 11/30/19 2254 12/01/19 0400  Weight: 77 kg 76 kg 74.3 kg    Examination: GEN: Acutely ill appearing man out of bed to chair, on vent HEENT: trach in place , mild secretions, mild pallor, no icterus CV: regular, S1-S2 PULM: Bilateral scattered rhonchi GI: Abdomen remains distended, active bowel sounds, nontender EXT: + muscle wasting, trace edema, tender both legs, no swelling of ankles NEURO: Moves all 4 extremities to command PSYCH: RASS 0 to +1 SKIN: No rashes, LUE AV fistula with good thrill  Labs show drop in hemoglobin to 6.9, decrease leukocytosis, rising creatinine Chest x-ray 12/14 personally reviewed which shows bilateral layering pleural effusions with cardiomegaly  Resolved Hospital Problem list     Assessment & Plan:  # Cardiac arrest- doubt PE but contrast timing poor, venous duplex negative  Vfib rhythm,  Combined systolic and diastolic CHF, NSVT, prolonged QT  -ct amiodarone.  -Discontinue IV heparin , low prop PE and hemoglobin is dropping -Not a candidate for ICD per EP -we will have to readdress ICD versus LifeVest   # Shock - related to bad heart, renal vasoplegia, persistent -Off steroids -Discontinue subclavian access  #  Acute respiratory failure, difficult intubation needing cric- s/p  trach  -Goal is to progress to trach collar but not tolerate much weaning today   # ESRD-intermittent HD per renal, last 12/16  # Hx polysustance abuse- cocaine, alcohol, tobacco -Fentanyl as needed  # Ileus- persistent, tolerating  tube feeds  #Anemia -?  Of critical illness, no evidence of blood loss -Transfuse 1 unit PRBC today    Best practice:  Diet: TFs Pain/Anxiety/Delirium protocol (if indicated): see above VAP protocol (if indicated): yes, ordered DVT prophylaxis: heparin gtt GI prophylaxis: PPI Glucose control: yes less than 180 Foley: none Mobility: bed rest Code Status: Full Family Communication: son Disposition: ICU   The patient is critically ill with multiple organ systems failure and requires high complexity decision making for assessment and support, frequent evaluation and titration of therapies, application of advanced monitoring technologies and extensive interpretation of multiple databases. Critical Care Time devoted to patient care services described in this note independent of APP/resident  time is 35 minutes.    Kara Mead MD. [place] Flood. [facility]  If no response to pager , please call [phone]   12/01/2019

## 2019-12-01 NOTE — Progress Notes (Signed)
Nutrition Follow-up  DOCUMENTATION CODES:   Not applicable  INTERVENTION:   Tube Feeding:  Vital 1.5 at 50 ml/hr Pro-Stat 30 mL TID Provides 2100 kcals, 126 g of protein and 912 mL of free water  Continue B-complex with C; change to Rena-Vit when/if able to take po  NUTRITION DIAGNOSIS:   Inadequate oral intake related to acute illness as evidenced by NPO status.  Being addressed via TF   GOAL:   Patient will meet greater than or equal to 90% of their needs  Met  MONITOR:   Vent status, Labs, Weight trends, TF tolerance  REASON FOR ASSESSMENT:   Ventilator    ASSESSMENT:   53 yo male admitted post cardiac arrest at [facility], required emergent cricothyrotomy in the field. PMH includes ESRD on HD, HTN, CHF, cocaine and EtOH abuse, medical nonadherence  12/02 Admit post Cardiac Arrest, TTM 33 degrees 12/04 Gastric Cortrak placed, CRRT initiated 12/08 Arrested, CRRT to resume 12/10 Cricthothyrotomy changed to [place] 12/11 KUB with ileus pattern, CRRT d/c 12/13 Abd xray with improved ileus 12/15 Cortrak advanced to post-pyloric position  Pt alert on vent via trach. Pt denies any N/V or abdominal pain  Tolerating Vital 1.5 TF via Cortrak (post pyloric)  Rectal tube in place with liquid stool  Stage II on sacrum  Labs: phosphorus 2.3 (L), potassium 3.8 (wdl) Meds: B-complex with C   Diet Order:   Diet Order    None      EDUCATION NEEDS:   Not appropriate for education at this time  Skin:  Skin Assessment: Skin Integrity Issues: Skin Integrity Issues:: Stage II Stage II: sacrum  Last BM:  12/17 via rectal tube  Height:   Ht Readings from Last 1 Encounters:  11/16/19 5' 10.5" (1.791 m)    Weight:   Wt Readings from Last 1 Encounters:  12/01/19 74.3 kg    BMI:  Body mass index is 23.17 kg/m.  Estimated Nutritional Needs:   Kcal:  2118 kcals  Protein:  120-160 g  Fluid:  1000 mL plus UOP   [employer] MS, RDN, LDN, CNSC [phone] Pager  [phone] Weekend/On-Call Pager

## 2019-12-01 NOTE — Progress Notes (Signed)
[facility] Progress Note Patient Name: Mikai Meints DOB: [birth year] MRN: [id number]   Date of Service  12/01/2019  HPI/Events of Note  Anemia - Hgb = 6.9   eICU Interventions  Will transfuse 1 unit PRBC.     Intervention Category Major Interventions: Other:  Marice Angelino Cornelia Copa 12/01/2019, 4:59 AM

## 2019-12-01 NOTE — Progress Notes (Signed)
[facility] ROUNDING NOTE   Subjective:   Had HD on 12/16 with 1 kg UF. Hemoglobin further declined to 6.9 overnight. BP 80's and 90's overnight.  94/65 on exam.  He is to receive a unit of PRBC's.  He remains on heparin gtt per primary team.  Trach collar yesterday.   He has been off of levo.  Received albumin overnight.  Review of systems: limited secondary to vent; patient denies air hunger, nausea or vomiting. Shakes head no that he does not feel swollen     Background:  Is a 53 year old gentleman history of SVT systolic heart failure polysubstance abuse end-stage renal disease Monday Wednesday Friday dialysis [facility] status post PEA arrest 11/16/2019.  Occurred about 3-1/2 hours into hemodialysis treatment received 3 rounds of epinephrine 1 g of calcium 50 mEq of bicarbonate with ROSC of about 15 minutes.  Emergent cricothyroitomy performed by EMS due to failed intubation.  CRRT initiated 11/18/2019.     Objective:  Vital signs in last 24 hours:  Temp:  [96.2 F (35.7 C)-97.8 F (36.6 C)] 97.6 F (36.4 C) (12/17 0400) Pulse Rate:  [71-95] 82 (12/17 0445) Resp:  [13-26] 18 (12/17 0445) BP: (70-126)/(44-106) 86/54 (12/17 0445) SpO2:  [92 %-100 %] 95 % (12/17 0445) FiO2 (%):  [40 %-50 %] 50 % (12/17 0400) Weight:  [74.3 kg-77 kg] 74.3 kg (12/17 0400)  Weight change: 0.6 kg Filed Weights   11/30/19 1914 11/30/19 2254 12/01/19 0400  Weight: 77 kg 76 kg 74.3 kg    Intake/Output: I/O last 3 completed shifts: In: 2120.2 [I.V.:982.6; NG/GT:1049; IV Piggyback:88.6] Out: -    Intake/Output this shift:  Total I/O In: 919.7 [I.V.:157; NG/GT:722.7; IV Piggyback:40] Out: 1000 [Other:1000]  Exam General adult male in bed awake on arrival; he is on vent via trach  HEENT normocephalic atraumatic extraocular movements intact sclera anicteric Lung coarse mechanical breath sounds on 50% fio2 peep 5  Heart s1s2 no rub Abdomen soft nontender  distended Extremities no edema appreciated  Psych no agitation Access LUE AVF bruit and thrill - bandage is in place  HD [facility]: [facility]  4h 90mn  74.5kg  2/2.5 bath  340/A1.5   P4  Hep 5000     C3 0.75 qTx     no ESA/Fe     Hb 12s usually  CXR 12/8 - no chg bilat layering effusion and IS/ alveloar infiltrates   Assessment:   ESRD- MWF HD.  Started on CR[place] 11[place]19/2019.  [place] First HD on 12/14 then 12/16 Note temp line removed due to leukocytosis.  Using AVF - Plan for HD on 12/18 - would recommend support of pressor to UF if needed.  If not able to tolerate UF may need back on CRRT - Please avoid applying tape directly to any scabs overlying AVF    Status post arrest x 2- PEA at OP HD on 12/2, 157m until ROSC.  ECHO LV = 20% ejection fraction. Seen by EP team. Then VT arrest on 12/8, recovered.   New onset atrial fibrillation CHA2DS2-VASc score 3 - per primary   Chronic combined congestive heart failure:  EF was 30% , now < 20% not a candidate for ICD due to noncompliance and drug abuse  Prolonged QT - noted   Cardiogenic shock: pressors per primary team.  On midodrine 10 tid.  At OP HD pt's BP's are normal and pt on coreg and Imdur at home  ANEMIA- CKD and acute decline  as well, has been on heparin per primary.  Agree with PRBC's on 12/17.  aranesp 100 mcg on 12/15.    MBD-severe chronic hyperphosphatemia - back on HD.  Discontinued the low dose calcium acetate given low phos.  Trend   ACCESS-AV fistula  Diffuse nonspecific encephalopathy EEG performed   Emergent cricothyroidotomy / acute respiratory failure- per CCM  History of polysubstance abuse  CT scan of chest with suspicion for pulmonary embolus - per primary team    Basic Metabolic Panel: Recent Labs  Lab 11/25/19 0359 11/25/19 0408 11/27/19 0341 11/28/19 0401 11/29/19 9983 11/29/19 2019 11/30/19 0359 11/30/19 2236 12/01/19 0356  NA 137  --  133* 130* 134* 133* 132* 135 136  K 4.1  --  3.8  4.0 4.0 3.6 3.9 3.3* 3.8  CL 101   < > 98 96* 99 97* 96* 99 96*  CO2 21*   < > 18* 15* 18* 18* 18* 25 24  GLUCOSE 117*   < > 112* 109* 129* 140* 124* 106* 128*  BUN 21*   < > 62* 79* 67* 81* 88* 31* 46*  CREATININE 1.71*   < > 5.25* 6.67* 5.91* 6.61* 7.13* 2.74* 4.41*  CALCIUM 8.0*   < > 7.8* 7.7* 7.6* 7.7* 7.8* 7.5* 7.5*  MG 2.5*  --  2.8* 2.8*  --  2.6*  --  2.0  --   PHOS 3.0  --  3.7 5.2* 5.0*  --  5.8*  --  2.3*   < > = values in this interval not displayed.    Liver Function Tests: Recent Labs  Lab 11/24/19 1632 11/25/19 0359 11/29/19 3825 11/30/19 0359 12/01/19 0356  ALBUMIN 2.1* 2.3* 2.0* 2.1* 2.2*   No results for input(s): LIPASE, AMYLASE in the last 168 hours. No results for input(s): AMMONIA in the last 168 hours.  CBC: Recent Labs  Lab 11/27/19 0341 11/28/19 0401 11/29/19 0411 11/30/19 0359 12/01/19 0356  WBC 19.6* 16.7* 14.4* 15.0* 13.1*  NEUTROABS 16.0* 13.9* 12.9* 11.9* 10.0*  HGB 9.8* 9.0* 7.8* 7.8* 6.9*  HCT 28.0* 24.6* 21.5* 21.5* 19.4*  MCV 85.1 81.2 78.5* 80.5 82.2  PLT 195 243 278 398 421*    Cardiac Enzymes: No results for input(s): CKTOTAL, CKMB, CKMBINDEX, TROPONINI in the last 168 hours.  BNP: Invalid input(s): POCBNP  CBG: Recent Labs  Lab 11/30/19 1100 11/30/19 1541 11/30/19 1937 12/01/19 0002 12/01/19 0405  GLUCAP 100* 86 124* 118* 118*     Medications:   . sodium chloride Stopped (12/01/19 0136)  . feeding supplement (VITAL 1.5 CAL) 1,000 mL (12/01/19 0008)  . heparin Stopped (12/01/19 0352)   . sodium chloride   Intravenous Once  . amiodarone  200 mg Oral Daily  . B-complex with vitamin C  1 tablet Per Tube Daily  . calcium acetate  667 mg Oral TID WC  . chlorhexidine gluconate (MEDLINE KIT)  15 mL Mouth Rinse BID  . Chlorhexidine Gluconate Cloth  6 each Topical Daily  . Chlorhexidine Gluconate Cloth  6 each Topical Q0600  . Chlorhexidine Gluconate Cloth  6 each Topical Q0600  . docusate  100 mg Per Tube Daily  .  feeding supplement (PRO-STAT SUGAR FREE 64)  30 mL Per Tube TID  . mouth rinse  15 mL Mouth Rinse 10 times per day  . midodrine  10 mg Per Tube Q8H  . pantoprazole sodium  40 mg Per Tube QHS  . sodium chloride flush  10-40 mL Intracatheter Q12H  .  white petrolatum   Topical Daily   sodium chloride, acetaminophen (TYLENOL) oral liquid 160 mg/5 mL, acetaminophen, fentaNYL (SUBLIMAZE) injection, oxyCODONE, prochlorperazine, sodium chloride flush     Claudia Desanctis 12/01/2019 5:35 AM

## 2019-12-01 NOTE — Progress Notes (Signed)
   Tele reviewed. No further sustained VT on po amiodarone. He continues to have PVCs and NSVT 3-4 beats.   He remains on full vent support through trach.   We will continue to follow for plan -> likely lifevest at this time in the setting of severe co-morbidities.   [name] [address]" [place], PA-C  12/01/2019 1:09 PM

## 2019-12-02 ENCOUNTER — Inpatient Hospital Stay (HOSPITAL_COMMUNITY): Payer: Medicare Other

## 2019-12-02 DIAGNOSIS — I998 Other disorder of circulatory system: Secondary | ICD-10-CM

## 2019-12-02 DIAGNOSIS — I428 Other cardiomyopathies: Secondary | ICD-10-CM

## 2019-12-02 DIAGNOSIS — N186 End stage renal disease: Secondary | ICD-10-CM

## 2019-12-02 DIAGNOSIS — Z992 Dependence on renal dialysis: Secondary | ICD-10-CM

## 2019-12-02 LAB — CBC WITH DIFFERENTIAL/PLATELET
Abs Immature Granulocytes: 0.53 10*3/uL — ABNORMAL HIGH (ref 0.00–0.07)
Basophils Absolute: 0 10*3/uL (ref 0.0–0.1)
Basophils Relative: 0 %
Eosinophils Absolute: 0.1 10*3/uL (ref 0.0–0.5)
Eosinophils Relative: 1 %
HCT: 22.5 % — ABNORMAL LOW (ref 39.0–52.0)
Hemoglobin: 7.5 g/dL — ABNORMAL LOW (ref 13.0–17.0)
Immature Granulocytes: 4 %
Lymphocytes Relative: 7 %
Lymphs Abs: 1 10*3/uL (ref 0.7–4.0)
MCH: 29.3 pg (ref 26.0–34.0)
MCHC: 33.3 g/dL (ref 30.0–36.0)
MCV: 87.9 fL (ref 80.0–100.0)
Monocytes Absolute: 1.4 10*3/uL — ABNORMAL HIGH (ref 0.1–1.0)
Monocytes Relative: 9 %
Neutro Abs: 11.5 10*3/uL — ABNORMAL HIGH (ref 1.7–7.7)
Neutrophils Relative %: 79 %
Platelets: 423 10*3/uL — ABNORMAL HIGH (ref 150–400)
RBC: 2.56 MIL/uL — ABNORMAL LOW (ref 4.22–5.81)
RDW: 18.7 % — ABNORMAL HIGH (ref 11.5–15.5)
WBC: 14.5 10*3/uL — ABNORMAL HIGH (ref 4.0–10.5)
nRBC: 3.6 % — ABNORMAL HIGH (ref 0.0–0.2)

## 2019-12-02 LAB — BPAM RBC
Blood Product Expiration Date: 202101162359
ISSUE DATE / TIME: 202012170916
Unit Type and Rh: 6200

## 2019-12-02 LAB — GLUCOSE, CAPILLARY
Glucose-Capillary: 108 mg/dL — ABNORMAL HIGH (ref 70–99)
Glucose-Capillary: 114 mg/dL — ABNORMAL HIGH (ref 70–99)
Glucose-Capillary: 116 mg/dL — ABNORMAL HIGH (ref 70–99)
Glucose-Capillary: 119 mg/dL — ABNORMAL HIGH (ref 70–99)
Glucose-Capillary: 126 mg/dL — ABNORMAL HIGH (ref 70–99)
Glucose-Capillary: 137 mg/dL — ABNORMAL HIGH (ref 70–99)

## 2019-12-02 LAB — RENAL FUNCTION PANEL
Albumin: 2.1 g/dL — ABNORMAL LOW (ref 3.5–5.0)
Anion gap: 13 (ref 5–15)
BUN: 65 mg/dL — ABNORMAL HIGH (ref 6–20)
CO2: 24 mmol/L (ref 22–32)
Calcium: 7.5 mg/dL — ABNORMAL LOW (ref 8.9–10.3)
Chloride: 97 mmol/L — ABNORMAL LOW (ref 98–111)
Creatinine, Ser: 5.94 mg/dL — ABNORMAL HIGH (ref 0.61–1.24)
GFR calc Af Amer: 12 mL/min — ABNORMAL LOW (ref >60)
GFR calc non Af Amer: 10 mL/min — ABNORMAL LOW (ref >60)
Glucose, Bld: 115 mg/dL — ABNORMAL HIGH (ref 70–99)
Phosphorus: 2.3 mg/dL — ABNORMAL LOW (ref 2.5–4.6)
Potassium: 4.3 mmol/L (ref 3.5–5.1)
Sodium: 134 mmol/L — ABNORMAL LOW (ref 135–145)

## 2019-12-02 LAB — HEMOGLOBIN A1C
Hgb A1c MFr Bld: 6.2 % — ABNORMAL HIGH (ref 4.8–5.6)
Mean Plasma Glucose: 131.24 mg/dL

## 2019-12-02 LAB — TYPE AND SCREEN
ABO/RH(D): A POS
Antibody Screen: NEGATIVE
Unit division: 0

## 2019-12-02 MED ORDER — AMIODARONE HCL IN DEXTROSE 360-4.14 MG/200ML-% IV SOLN
INTRAVENOUS | Status: AC
  Filled 2019-12-02: qty 200

## 2019-12-02 MED ORDER — AMIODARONE HCL IN DEXTROSE 360-4.14 MG/200ML-% IV SOLN
60.0000 mg/h | INTRAVENOUS | Status: DC
  Administered 2019-12-02 (×2): 60 mg/h via INTRAVENOUS
  Filled 2019-12-02: qty 2000

## 2019-12-02 MED ORDER — FENTANYL CITRATE (PF) 100 MCG/2ML IJ SOLN
25.0000 ug | INTRAMUSCULAR | Status: DC | PRN
  Administered 2019-12-03 – 2019-12-15 (×12): 25 ug via INTRAVENOUS
  Filled 2019-12-02 (×13): qty 2

## 2019-12-02 MED ORDER — AMIODARONE HCL IN DEXTROSE 360-4.14 MG/200ML-% IV SOLN
30.0000 mg/h | INTRAVENOUS | Status: DC
  Administered 2019-12-03 – 2019-12-06 (×7): 30 mg/h via INTRAVENOUS
  Filled 2019-12-02 (×7): qty 200

## 2019-12-02 MED ORDER — INSULIN ASPART 100 UNIT/ML ~~LOC~~ SOLN
0.0000 [IU] | SUBCUTANEOUS | Status: DC
  Administered 2019-12-02 – 2019-12-23 (×10): 1 [IU] via SUBCUTANEOUS

## 2019-12-02 MED ORDER — AMIODARONE LOAD VIA INFUSION
150.0000 mg | Freq: Once | INTRAVENOUS | Status: AC
  Administered 2019-12-02: 150 mg via INTRAVENOUS
  Filled 2019-12-02: qty 83.34

## 2019-12-02 MED ORDER — ALBUMIN HUMAN 25 % IV SOLN
INTRAVENOUS | Status: AC
  Administered 2019-12-02: 12.5 g
  Filled 2019-12-02: qty 50

## 2019-12-02 MED ORDER — HEPARIN SODIUM (PORCINE) 1000 UNIT/ML IJ SOLN
INTRAMUSCULAR | Status: AC
  Filled 2019-12-02: qty 1

## 2019-12-02 NOTE — Consult Note (Signed)
[facility] Nurse Consult Note: Reason for Consult: Wound type: Full thickness ulcerations;  Not present prior to admission. PEA arrest with use of pressors; history ESRD. Now with two full thickness ulcerations on the right knee and 2 full thickness ulcerations on the left knee. With extension circumferentially that is dark purple but not open. Sloughing of the bilateral feed, superficial with darkening of both feet.   Pressure Injury POA:NA Measurement: added each ulcer to the nursing flow sheets; will need staff to measure and add with first dressing application Wound KCL:EXNTZ knee medial100% black; proximal clean 100% pink, appears dry Left medial and distal wounds both pink All of these wounds appear punched out in appearance.  Periwound:intact  Dressing procedure/placement/frequency: Vascular consult pending completion; topical care ordered in the interim for moist wound healing.   Discussed POC with patient and bedside nurse.  Re consult if needed, will not follow at this time. Thanks  Stephie Xu [employer], RN,CWOCN, CNS, [place] [phone])

## 2019-12-02 NOTE — Progress Notes (Addendum)
   Paged to come and assess patient for VT.   On my arrival pt in VT with slow rates of around 110-120s. He is awake and alert with stable BP. He denies CP, SOB, lightheadedness, or palpitations.   Will re-bolus with IV amiodarone and continue to follow (was on 200 mg daily, per tube)  Patients VT broke and he went back into AF with rates in the 80-90s PRIOR to initiation of amiodarone.    K 4.3 this am and Mg 2.0. He was set to go for HD today, but has not had it yet.   Discussed all above with Dr. Curt Bears.  We will continue to follow along for disposition and possible LifeVest.  [name] "[place], PA-C  12/02/2019 1:56 PM

## 2019-12-02 NOTE — Progress Notes (Signed)
Goal reduced to 1.5 d/t hypotension

## 2019-12-02 NOTE — Consult Note (Addendum)
Hospital Consult    Reason for Consult:  Ischemic BLE Requesting Physician:  Elsworth Soho MRN #:  [id number]  History of Present Illness: This is a 53 y.o. male who came in ~ 2 weeks ago with PEA arrest and required emergent cricothyrotomy by EMS.   He did require pressors and is now off these.  He was on heparin gtt and this was discontinued yesterday.    His echo this admission revealed EF of 20%.  Pt does not know how long his feet have been hurting.  He denies any pain in his feet or legs prior to his cardiac event.  He denies any claudication sx prior also.  He says that his feet hurt.  His legs do not hurt.  He did not have wounds prior to admission.    He is ESRD pt and has a LUA AVF.  It was plicated back in August and when asked, he says it is working well.    The pt is not on a statin for cholesterol management.  The pt is not on a daily aspirin.   Other AC:  none The pt is not on meds for hypertension.   The pt is not diabetic.   Tobacco hx:  remote  Past Medical History:  Diagnosis Date  . Abdominal distension 08/10/2018  . Alcohol abuse 11/26/2010   Qualifier: Diagnosis of  By: [name] MD, Pershing Proud  . Anemia due to blood loss, acute 07/15/2012  . CHF (congestive heart failure) ([place])   . Cholecystitis 04/18/2014  . Cigarette smoker    has currently quit  . Cocaine abuse ([place]) 11/26/2010   Qualifier: Diagnosis of  By: [name] MD, Benjamine Mola  has been over a year  . Combined congestive systolic and diastolic heart failure ([place]) 04/18/2014   A. Echo 8/13: Severe LVH, EF 40-45%, inferoposterior akinesis, grade 2 diastolic dysfunction, moderate LAE, mild RVE, mildly reduced RVSF, mild RAE; cannot rule out R atrial mass-suggest TEE or cardiac MRI  //  B. Echo 3/17: Mild LVH, EF 25-30%, diffuse HK, grade 2 diastolic dysfunction, mild MR, severe LAE,  moderately reduced RVSF, severe RAE, mild TR, moderate PI, PASP 55 mmHg    . Dyspnea   . ESRD (end stage renal disease) on  dialysis [facility])    MWF- [place] (05/18/2017)  . Hemodialysis patient ([place])    M,W,F  . Hernia, inguinal, right 08/05/2016  . Hypertension   . 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 Hypocalcemia 07/15/2012  . Medical non-compliance   . Non-ischemic cardiomyopathy ([place])    A. R/L HC 3/17: Normal coronary arteries, moderate pulmonary hypertension (PASP 65 mmHg), elevated LV filling pressures (LVEDP 45 mmHg)   . NSVT (nonsustained ventricular tachycardia) ([place]) 02/22/2016  . NSVT (nonsustained ventricular tachycardia) ([place]) 02/22/2016  . Pneumonia 11/12/2017  . Polysubstance abuse ([place])   . Prolonged Q-T interval on ECG 05/18/2017  . Prolonged QT interval 05/18/2017  . Restless leg syndrome   . Solitary pulmonary nodule 10/10/2015   See cxr 10/09/2015 - CT rec 10/10/2015 >>>   . Upper airway cough syndrome 10/09/2015   Off ACEi around 1st Oct 2016  - Sinus CT 10/10/2015 >>>      Past Surgical History:  Procedure Laterality Date  . [place] TRANSPOSITION  01/19/2013   Procedure: BASCILIC VEIN TRANSPOSITION;  Surgeon: Conrad [place], MD;  Location: [facility];  Service: Vascular;  Laterality: Left;  left 2nd stage basilic vein transposition  . CARDIAC CATHETERIZATION N/A 02/25/2016   Procedure: Right/Left Heart Cath and Coronary Angiography;  Surgeon: Burnell Blanks, MD;  Location: [facility];  Service: Cardiovascular;  Laterality: N/A;  . INGUINAL HERNIA REPAIR Right 08/05/2016   Procedure: RIGHT INGUINAL HERNIA REPAIR WITH MESH;  Surgeon: Donnie Mesa, MD;  Location: [facility];  Service: General;  Laterality: Right;  . INSERTION OF DIALYSIS CATHETER  07/17/2012   Procedure: INSERTION OF DIALYSIS CATHETER;  Surgeon: Conrad McIntire, MD;  Location: [facility];  Service: Vascular;  Laterality: Right;  right internal jugular  . INSERTION OF MESH Right 08/05/2016   Procedure:  INSERTION OF MESH;  Surgeon: Donnie Mesa, MD;  Location: [facility];  Service: General;  Laterality: Right;  . REVISION OF ARTERIOVENOUS GORETEX GRAFT Left 1/61/0960   Procedure: PLICATION OF ARTERIOVENOUS FISTULA LEFT ARM;  Surgeon: Elam Dutch, MD;  Location: [facility];  Service: Vascular;  Laterality: Left;  . REVISON OF ARTERIOVENOUS FISTULA Left 01/06/2017   Procedure: REVISON OF ARTERIOVENOUS FISTULA;  Surgeon: Conrad Sherrodsville, MD;  Location: [facility];  Service: Vascular;  Laterality: Left;  . REVISON OF ARTERIOVENOUS FISTULA Left 10/27/2018   Procedure: REVISION PLICATION OF ARTERIOVENOUS FISTULA  LEFT UPPER ARM;  Surgeon: Waynetta Sandy, MD;  Location: [facility];  Service: Vascular;  Laterality: Left;  . TRACHEOSTOMY TUBE PLACEMENT N/A 11/24/2019   Procedure: TRACHEOSTOMY;  Surgeon: Izora Gala, MD;  Location: [facility];  Service: ENT;  Laterality: N/A;  . UMBILICAL HERNIA REPAIR N/A 08/05/2016   Procedure: Central;  Surgeon: Donnie Mesa, MD;  Location: [facility];  Service: General;  Laterality: N/A;  . VENOGRAM N/A 08/09/2012   Procedure: VENOGRAM;  Surgeon: Conrad Clatskanie, MD;  Location[facility] CATH [facility];  Service: Cardiovascular;  Laterality: N/A;    Allergies  Allergen Reactions  . Losartan Cough    Prior to Admission medications   Medication Sig Start Date End Date Taking? Authorizing Provider  calcium acetate (PHOSLO) 667 MG capsule Take 3 capsules (2,001 mg total) by mouth 3 (three) times daily with meals. Patient taking differently: Take 1,334-3,335 mg by mouth See admin instructions. Take 3-5 capsules (2001 - 3335 mg) by mouth 3 times daily with large meals, take 2 capsules (1334 mg) with snacks 04/21/14   Mikhail, Velta Addison, DO  carvedilol (COREG) 25 MG tablet Take 1 tablet (25 mg total) by mouth 2 (two) times daily. 05/20/17   Geradine Girt, DO  clonazePAM (KLONOPIN) 0.5 MG tablet Take 1 tablet (0.5 mg total) by mouth as needed. Patient taking differently: Take 0.5  mg by mouth every Monday, Wednesday, and Friday with hemodialysis.  09/17/18   Jean Rosenthal, MD  HYDROcodone-acetaminophen (NORCO) 5-325 MG tablet Take 1 tablet by mouth every 6 (six) hours as needed for moderate pain. 08/02/19   Dagoberto Ligas, PA-C  isosorbide mononitrate (IMDUR) 60 MG 24 hr tablet Take 1 tablet (60 mg total) by mouth daily. 05/20/17   Geradine Girt, DO    Social History   Socioeconomic History  . Marital status: Single    Spouse name: Not on file  . Number of children: Not on file  . Years of education: Not on file  . Highest education level: Not on file  Occupational History  . Not on file  Tobacco Use  . Smoking status: Former Smoker    Years: 30.00    Types:  Cigarettes    Quit date: 10/20/2018    Years since quitting: 1.1  . Smokeless tobacco: Never Used  Substance and Sexual Activity  . Alcohol use: Yes    Alcohol/week: 4.0 standard drinks    Types: 4 Cans of beer per week  . Drug use: No    Types: Marijuana    Comment: 05/18/2017 "qd"  . Sexual activity: Not Currently  Other Topics Concern  . Not on file  Social History Narrative  . Not on file   Social Determinants of Health   Financial Resource Strain:   . Difficulty of Paying Living Expenses: Not on file  Food Insecurity:   . Worried About Charity fundraiser in the Last Year: Not on file  . Ran Out of Food in the Last Year: Not on file  Transportation Needs:   . Lack of Transportation (Medical): Not on file  . Lack of Transportation (Non-Medical): Not on file  Physical Activity:   . Days of Exercise per Week: Not on file  . Minutes of Exercise per Session: Not on file  Stress:   . Feeling of Stress : Not on file  Social Connections:   . Frequency of Communication with Friends and Family: Not on file  . Frequency of Social Gatherings with Friends and Family: Not on file  . Attends Religious Services: Not on file  . Active Member of Clubs or Organizations: Not on file  . Attends Theatre manager Meetings: Not on file  . Marital Status: Not on file  Intimate Partner Violence:   . Fear of Current or Ex-Partner: Not on file  . Emotionally Abused: Not on file  . Physically Abused: Not on file  . Sexually Abused: Not on file     Family History  Problem Relation Age of Onset  . Hypertension Mother   . Diabetes Mother   . Renal Disease Mother   . Hypertension Father     ROS: [x]  Positive   [ ]  Negative   [ ]  All sytems reviewed and are negative  Cardiac: [x]  cardiac arrest  Vascular: []  pain in legs while walking [x]  pain in feet at rest  Pulmonary: [x]  tracheostomy  Neurologic: []  hx of CVA   Hematologic: []  hx of cancer  Endocrine:   []  diabetes []  thyroid disease  GI []  vomiting blood []  blood in stool  GU: [x]  CKD/renal failure [x]  HD  Psychiatric: []  anxiety []  depression  Musculoskeletal: []  arthritis []  joint pain  Integumentary: []  rashes []  ulcers  Constitutional: []  fever []  chills   Physical Examination  Vitals:   12/02/19 0800 12/02/19 0912  BP: 90/65   Pulse: 93 95  Resp: 20 (!) 26  Temp: 98.9 F (37.2 C)   SpO2: 100% 98%   Body mass index is 24.23 kg/m.  General:  WDWN in NAD Gait: Not observed HENT: WNL, normocephalic Pulmonary: normal non-labored breathing, without Rales, rhonchi,  wheezing Cardiac: regular Abdomen:  soft, NT/ND, no masses Skin: without rashes Vascular Exam/Pulses:  Right Left  Radial Unable to palpate  2+ (normal)  Femoral Unable to palpate (pt sitting in chair and difficult to palpate)  1+ (weak)  DP Brisk doppler signal Brisk doppler signal  PT Brisk doppler signal Brisk doppler signal  Peroneal Brisk doppler signal Brisk doppler signal   Extremities: LUA AVF with + thrill    Musculoskeletal: no muscle wasting or atrophy  Neurologic: A&O X 3;  No focal weakness or paresthesias are detected;  speech is fluent/normal Psychiatric:  The pt has Normal affect.   CBC     Component Value Date/Time   WBC 14.5 (H) 12/02/2019 0527   RBC 2.56 (L) 12/02/2019 0527   HGB 7.5 (L) 12/02/2019 0527   HGB 12.3 (L) 12/31/2017 1012   HCT 22.5 (L) 12/02/2019 0527   HCT 37.4 (L) 12/31/2017 1012   PLT 423 (H) 12/02/2019 0527   PLT 176 12/31/2017 1012   MCV 87.9 12/02/2019 0527   MCV 99 (H) 12/31/2017 1012   MCH 29.3 12/02/2019 0527   MCHC 33.3 12/02/2019 0527   RDW 18.7 (H) 12/02/2019 0527   RDW 18.8 (H) 12/31/2017 1012   LYMPHSABS 1.0 12/02/2019 0527   LYMPHSABS 1.0 12/31/2017 1012   MONOABS 1.4 (H) 12/02/2019 0527   EOSABS 0.1 12/02/2019 0527   EOSABS 0.1 12/31/2017 1012   BASOSABS 0.0 12/02/2019 0527   BASOSABS 0.0 12/31/2017 1012    BMET    Component Value Date/Time   NA 134 (L) 12/02/2019 0527   K 4.3 12/02/2019 0527   CL 97 (L) 12/02/2019 0527   CO2 24 12/02/2019 0527   GLUCOSE 115 (H) 12/02/2019 0527   BUN 65 (H) 12/02/2019 0527   CREATININE 5.94 (H) 12/02/2019 0527   CALCIUM 7.5 (L) 12/02/2019 0527   CALCIUM 8.2 (L) 10/21/2010 1420   GFRNONAA 10 (L) 12/02/2019 0527   GFRAA 12 (L) 12/02/2019 0527    COAGS: Lab Results  Component Value Date   INR 1.6 (H) 11/16/2019   INR 1.5 (H) 11/16/2019   INR 1.36 10/27/2018     Non-Invasive Vascular Imaging:   none   ASSESSMENT/PLAN: This is a 53 y.o. male who presented a couple of weeks ago with cardiac arrest and required pressors until a couple of days ago.  -pt with painful bilateral feet and sloughing of skin with duration unknown.  He also has wounds around both knees.  He was on Norepi for most of this hospitalization but was weaned a couple of days ago.  He also has EF of 20% on echo.   He now has brisk doppler signals bilateral feet.  He did not have any wounds or claudication sx prior to his event.   May eventually need amputation, but will allow feet/legs to demarcate.  Dr. Trula Slade to see pt later today.     Leontine Locket, PA-C [facility] [phone]   I agree  with the above.  I have seen and evaluated the patient.  Briefly this is a 53 year old gentleman who initially presented with PEA arrest during hemodialysis outpatient treatment.  Emergency cricothyroidotomy was performed by EMS due to failed airway.  Cricothyroidotomy was converted to tracheostomy.  He required long term management with pressors which he is now off.  During this time he developed ischemic changes to both feet which now have blisters and are painful.  He also has skin breakdown on both knees.  On examination he has brisk pedal Doppler signals.  At this time, no urgent management of his legs are necessary.  Given that he has adequate Doppler signals, I would like for his legs to demarcate.  There is a possibility that the blisters could heal however he is at risk for bilateral amputation.  As long as he remains without infectious issues, we will give these time before recommending amputation.  This was discussed with the patient who acknowledged understanding.  We will continue to follow the patient.  Annamarie Major

## 2019-12-02 NOTE — Progress Notes (Signed)
[facility] Progress Note Patient Name: [name] [name] DOB: 10/22/1966 MRN: [id number]   Date of Service  12/02/2019  HPI/Events of Note  Hypotension - Patient dropped BP to 91/69 with MAP = 77 post Fentanyl 50 mcg IV after patient's bath. HD scheduled for the AM. LVEF < 20% on last echo.  eICU Interventions  Will order: 1. Decrease Fentanyl dose to 25 mcg IV Q 3 hours PRN.      Intervention Category Major Interventions: Hypotension - evaluation and management  Ohana Birdwell Eugene 12/02/2019, 1:36 AM

## 2019-12-02 NOTE — Progress Notes (Signed)
Patient ID: [name] [name], male   DOB: Oct 27, 1966, 53 y.o.   MRN: [id number] S: Hypotensive overnight with VT and bp 91/69.  Fentanyl decreased and no longer with sustained VT on po amiodarone. O:BP 90/70   Pulse 100   Temp 98.2 F (36.8 C) (Axillary)   Resp 20   Ht 5' 10.5" (1.791 m)   Wt 77.7 kg   SpO2 99%   BMI 24.23 kg/m   Intake/Output Summary (Last 24 hours) at 12/02/2019 0850 Last data filed at 12/02/2019 0600 Gross per 24 hour  Intake 650 ml  Output 800 ml  Net -150 ml   Intake/Output: I/O last 3 completed shifts: In: 1601.9 [P.O.:50; I.V.:189.2; NG/GT:1322.7; IV Piggyback:40] Out: 2100 [Other:1000; VVOHY:0737]  Intake/Output this shift:  No intake/output data recorded. Weight change: 0.7 kg Gen: sitting up in bed, appears fatigued/weak on vent via trach CVS: tachy no rub Resp: occ rhonchi Abd: +BS, soft, NT/ND Ext: +edema of lower ext with ischemic changes of feet bilateral, LUE AVF faint t/b but present.  Recent Labs  Lab 11/27/19 0341 11/28/19 0401 11/29/19 1062 11/29/19 2019 11/30/19 0359 11/30/19 2236 12/01/19 0356 12/02/19 0527  NA 133* 130* 134* 133* 132* 135 136 134*  K 3.8 4.0 4.0 3.6 3.9 3.3* 3.8 4.3  CL 98 96* 99 97* 96* 99 96* 97*  CO2 18* 15* 18* 18* 18* 25 24 24   GLUCOSE 112* 109* 129* 140* 124* 106* 128* 115*  BUN 62* 79* 67* 81* 88* 31* 46* 65*  CREATININE 5.25* 6.67* 5.91* 6.61* 7.13* 2.74* 4.41* 5.94*  ALBUMIN  --   --  2.0*  --  2.1*  --  2.2* 2.1*  CALCIUM 7.8* 7.7* 7.6* 7.7* 7.8* 7.5* 7.5* 7.5*  PHOS 3.7 5.2* 5.0*  --  5.8*  --  2.3* 2.3*   Liver Function Tests: Recent Labs  Lab 11/30/19 0359 12/01/19 0356 12/02/19 0527  ALBUMIN 2.1* 2.2* 2.1*   No results for input(s): LIPASE, AMYLASE in the last 168 hours. No results for input(s): AMMONIA in the last 168 hours. CBC: Recent Labs  Lab 11/28/19 0401 11/29/19 0411 11/30/19 0359 12/01/19 0356 12/01/19 1500 12/02/19 0527  WBC 16.7* 14.4* 15.0* 13.1*  --  14.5*   NEUTROABS 13.9* 12.9* 11.9* 10.0*  --  11.5*  HGB 9.0* 7.8* 7.8* 6.9* 7.7* 7.5*  HCT 24.6* 21.5* 21.5* 19.4* 22.0* 22.5*  MCV 81.2 78.5* 80.5 82.2  --  87.9  PLT 243 278 398 421*  --  423*   Cardiac Enzymes: No results for input(s): CKTOTAL, CKMB, CKMBINDEX, TROPONINI in the last 168 hours. CBG: Recent Labs  Lab 12/01/19 1603 12/01/19 1957 12/02/19 0002 12/02/19 0329 12/02/19 0831  GLUCAP 86 136* 119* 126* 137*    Iron Studies: No results for input(s): IRON, TIBC, TRANSFERRIN, FERRITIN in the last 72 hours. Studies/Results: DG Chest Port 1 View  Result Date: 12/02/2019 CLINICAL DATA:  Acute respiratory failure. Tracheostomy tube present. EXAM: PORTABLE CHEST 1 VIEW COMPARISON:  One-view chest x-ray 11/28/2019 FINDINGS: Tracheostomy tube stable. Right subclavian line was removed. Feeding tube courses off the inferior border of the film. The heart is enlarged. Bilateral effusions and airspace disease are again seen. Airspace disease at the right base is increasing. IMPRESSION: 1. Stable bilateral effusions and airspace disease. 2. Increasing right basilar airspace disease. While this likely reflects atelectasis, infection is also considered. Electronically Signed   By: San Morelle M.D.   On: 12/02/2019 07:52   VAS [place] LOWER EXTREMITY VENOUS (DVT)  Result  Date: 11/30/2019  Lower Venous Study Indications: Pulmonary embolism.  Comparison Study: no prior Performing Technologist: Abram Sander RVS  Examination Guidelines: A complete evaluation includes B-mode imaging, spectral Doppler, color Doppler, and power Doppler as needed of all accessible portions of each vessel. Bilateral testing is considered an integral part of a complete examination. Limited examinations for reoccurring indications may be performed as noted.  +---------+---------------+---------+-----------+----------+--------------+ RIGHT    CompressibilityPhasicitySpontaneityPropertiesThrombus Aging  +---------+---------------+---------+-----------+----------+--------------+ CFV      Full           Yes      Yes                                 +---------+---------------+---------+-----------+----------+--------------+ SFJ      Full                                                        +---------+---------------+---------+-----------+----------+--------------+ FV Prox  Full                                                        +---------+---------------+---------+-----------+----------+--------------+ FV Mid   Full                                                        +---------+---------------+---------+-----------+----------+--------------+ FV DistalFull                                                        +---------+---------------+---------+-----------+----------+--------------+ PFV      Full                                                        +---------+---------------+---------+-----------+----------+--------------+ POP      Full           Yes      Yes                                 +---------+---------------+---------+-----------+----------+--------------+ PTV      Full                                                        +---------+---------------+---------+-----------+----------+--------------+ PERO     Full                                                        +---------+---------------+---------+-----------+----------+--------------+   +---------+---------------+---------+-----------+----------+--------------+  LEFT     CompressibilityPhasicitySpontaneityPropertiesThrombus Aging +---------+---------------+---------+-----------+----------+--------------+ CFV      Full           Yes      Yes                                 +---------+---------------+---------+-----------+----------+--------------+ SFJ      Full                                                         +---------+---------------+---------+-----------+----------+--------------+ FV Prox  Full                                                        +---------+---------------+---------+-----------+----------+--------------+ FV Mid   Full                                                        +---------+---------------+---------+-----------+----------+--------------+ FV DistalFull                                                        +---------+---------------+---------+-----------+----------+--------------+ PFV      Full                                                        +---------+---------------+---------+-----------+----------+--------------+ POP      Full           Yes      Yes                                 +---------+---------------+---------+-----------+----------+--------------+ PTV      Full                                                        +---------+---------------+---------+-----------+----------+--------------+ PERO     Full                                                        +---------+---------------+---------+-----------+----------+--------------+     Summary: Right: There is no evidence of deep vein thrombosis in the lower extremity. No cystic structure found in the popliteal fossa. Left: There is no evidence of deep vein thrombosis in the lower extremity. No cystic structure found in the popliteal fossa.  *  See table(s) above for measurements and observations. Electronically signed by Monica Martinez MD on 11/30/2019 at 4:46:00 PM.    Final    . amiodarone  200 mg Per Tube Daily  . B-complex with vitamin C  1 tablet Per Tube Daily  . chlorhexidine gluconate (MEDLINE KIT)  15 mL Mouth Rinse BID  . Chlorhexidine Gluconate Cloth  6 each Topical Daily  . Chlorhexidine Gluconate Cloth  6 each Topical Q0600  . Chlorhexidine Gluconate Cloth  6 each Topical Q0600  . Chlorhexidine Gluconate Cloth  6 each Topical Q0600  . docusate  100 mg  Per Tube Daily  . feeding supplement (PRO-STAT SUGAR FREE 64)  30 mL Per Tube TID  . insulin aspart  0-9 Units Subcutaneous Q4H  . mouth rinse  15 mL Mouth Rinse 10 times per day  . midodrine  10 mg Per Tube Q8H  . pantoprazole sodium  40 mg Per Tube QHS  . sodium chloride flush  10-40 mL Intracatheter Q12H  . white petrolatum   Topical Daily    BMET    Component Value Date/Time   NA 134 (L) 12/02/2019 0527   K 4.3 12/02/2019 0527   CL 97 (L) 12/02/2019 0527   CO2 24 12/02/2019 0527   GLUCOSE 115 (H) 12/02/2019 0527   BUN 65 (H) 12/02/2019 0527   CREATININE 5.94 (H) 12/02/2019 0527   CALCIUM 7.5 (L) 12/02/2019 0527   CALCIUM 8.2 (L) 10/21/2010 1420   GFRNONAA 10 (L) 12/02/2019 0527   GFRAA 12 (L) 12/02/2019 0527   CBC    Component Value Date/Time   WBC 14.5 (H) 12/02/2019 0527   RBC 2.56 (L) 12/02/2019 0527   HGB 7.5 (L) 12/02/2019 0527   HGB 12.3 (L) 12/31/2017 1012   HCT 22.5 (L) 12/02/2019 0527   HCT 37.4 (L) 12/31/2017 1012   PLT 423 (H) 12/02/2019 0527   PLT 176 12/31/2017 1012   MCV 87.9 12/02/2019 0527   MCV 99 (H) 12/31/2017 1012   MCH 29.3 12/02/2019 0527   MCHC 33.3 12/02/2019 0527   RDW 18.7 (H) 12/02/2019 0527   RDW 18.8 (H) 12/31/2017 1012   LYMPHSABS 1.0 12/02/2019 0527   LYMPHSABS 1.0 12/31/2017 1012   MONOABS 1.4 (H) 12/02/2019 0527   EOSABS 0.1 12/02/2019 0527   EOSABS 0.1 12/31/2017 1012   BASOSABS 0.0 12/02/2019 0527   BASOSABS 0.0 12/31/2017 1012   [facility]  4h 58mn  74.5kg  2/2.5 bath  340/A1.5   P4  Hep 5000     C3 0.75 qTx     no ESA/Fe     Hb 12s usually   Background:  Is a 53year old gentleman history of SVT systolic heart failure polysubstance abuse end-stage renal disease Monday Wednesday [facility] post PEA arrest 11/16/2019.  Occurred about 3-1/2 hours into hemodialysis treatment received 3 rounds of epinephrine 1 g of calcium 50 mEq of bicarbonate with ROSC of about 15 minutes.   Emergent cricothyroitomy performed by EMS due to failed intubation.  CRRT initiated 11/18/2019.   Assessment/Plan:  1. ESRD- will attempt IHD today but BP remains soft and may need pressor support vs continued CVVHD 2. S/p PEA arrest 12/2 and VT arrest 12/8- cardiology and CHF following.   3. ICHF- not candidate for ICD due to noncompliance and drug abuse. 4. Cardiogenic shock- off pressors at the moment. 5. Anemia of CKD- s/p blood transfusion 12/01/19.  Cont with ESA q week. 6. VDRF-  s/p emergent cricothyroidotomy.  Per PCCM 7. H/o polysubstance abuse 8. SHPTH- hold binders due to low phos and npo.    Donetta Potts, MD [employer] [phone]

## 2019-12-02 NOTE — Progress Notes (Signed)
  No further sustained VT on po amiodarone. Discussed with Dr. Rayann Heman.   We will continue to follow for potential [facility].   Please call with any questions.   [name] [id number] [place], PA-C  12/02/2019 8:44 AM

## 2019-12-02 NOTE — Progress Notes (Signed)
NAME:  [name] [name], MRN:  [id number], DOB:  08-30-1966, LOS: 70 ADMISSION DATE:  11/16/2019, CONSULTATION DATE:  12/2 REFERRING MD:  Dr. Sedonia Small, CHIEF COMPLAINT:  Post cardiac arrest    Brief History   53yo male presented post PEA arrest which occurred during iHD treatment as outpatient. This was a witnessed arrest with quick bystander CPR. Received 3 rounds of epi, 1g of calcium and 50 mEq of bicarb with ROSC after 15 minutes.   Emergent cricothyrotomy performed by EMS due to failed intubation with [facility] airway.   Past Medical History  NSVT 2017 Nonischemic cardiomyopathy Medical noncompliance Polysubstance abuse Hypertension End-stage renal disease Combined systolic and diastolic congestive heart failure Cocaine abuse Alcohol abuse Prolonged QTC  Significant Hospital Events   12/2 Admitted post cardiac arrest, emergent cric, 33 C TTM 12/8 V. tach arrest 12/10 Cric changed to tracheostomy 12/16 HD, tolerated only 30 minutes of trach collar  Consults:  Nephrology Electrophysiology ENT  Procedures:  12/10-trach 12/4 RT fem HD cath >> out 12/2 RT [place] CVL >>  Significant Diagnostic Tests:  CTA 12/2-segmental filling defects in the right middle lobe and right lower lobe pulmonary arteries, pneumomediastinum, subcu emphysema CT head 12/8-no acute abnormality  Echocardiogram 11/17/19-LVEF less than 20%, normal LV systolic function    Micro Data:  COVID 12/2 > negative Aspirate 12/04+ for Haemophilus influenza  12/11 Bc >> ng  Antimicrobials:  Unasyn 12/4>>12/7 Ceftriazone 12/7>>12/13  Interim history/subjective:   He is critically ill, remains on vent. Soft blood pressure overnight but off pressors Afebrile   Objective   Blood pressure 90/65, pulse 95, temperature 98.2 F (36.8 C), temperature source Axillary, resp. rate (!) 26, height 5' 10.5" (1.791 m), weight 77.7 kg, SpO2 98 %.    Vent Mode: PRVC FiO2 (%):  [40 %-50 %] 40 % Set Rate:  [20 bmp] 20 bmp Vt  Set:  [590 mL] 590 mL PEEP:  [5 cmH20] 5 cmH20 Pressure Support:  [12 cmH20] 12 cmH20 Plateau Pressure:  [18 cmH20-22 cmH20] 21 cmH20   Intake/Output Summary (Last 24 hours) at 12/02/2019 0955 Last data filed at 12/02/2019 0600 Gross per 24 hour  Intake 650 ml  Output 800 ml  Net -150 ml   Filed Weights   11/30/19 2254 12/01/19 0400 12/02/19 0400  Weight: 76 kg 74.3 kg 77.7 kg    Examination: GEN: Acutely ill appearing man able to sit up in bed, on vent HEENT: trach in place , mild secretions, mild pallor, no icterus CV: regular, S1-S2 , no rub PULM: Bilateral scattered rhonchi GI: Abdomen mildly distended, active bowel sounds, nontender EXT: + muscle wasting, trace edema, tender both legs with ischemic changes-dark skin, small vesicles NEURO: Moves all 4 extremities to command PSYCH: RASS 0 to +1 SKIN: No rashes, LUE AV fistula with good thrill  Labs show improved hemoglobin to 7.5 , rising creatinine Chest x-ray 12/18 shows bilateral effusions and airspace disease, increased right basilar airspace disease  Resolved Hospital Problem list     Assessment & Plan:  # Cardiac arrest- doubt PE but contrast timing poor, venous duplex negative  Vfib rhythm,  Combined systolic and diastolic CHF, NSVT, prolonged QT  -ct amiodarone. -Not a candidate for ICD per EP -May be LifeVest eventually   # Shock - related to bad heart, renal vasoplegia, persistent -Off steroids -Off pressors, will see if he tolerates dialysis today -Observing off antibiotics, leukocytosis and slight increase in right lower lobe infiltrate, follow  #Acute respiratory failure, difficult intubation needing  cric- s/p  trach  -Goal is to progress to trach collar, Foley will make more progress after dialysis   # ESRD-intermittent HD per renal, last 12/16 , repeat planned today  # Hx polysustance abuse- cocaine, alcohol, tobacco -Fentanyl as needed, lowered to 25 mcg  # Ileus- persistent, tolerating  tube feeds  #Anemia -?  Of critical illness, no evidence of blood loss, status post 1 unit PRBC 12/17 -Monitor  #Ischemic changes of feet -due to low flow, obtain vascular input, now off IV heparin -Wound care consult  Best practice:  Diet: TFs Pain/Anxiety/Delirium protocol (if indicated): see above VAP protocol (if indicated): yes, ordered DVT prophylaxis: heparin gtt GI prophylaxis: PPI Glucose control: yes less than 180 Foley: none Mobility: bed rest Code Status: Full Family Communication: son Disposition: ICU   The patient is critically ill with multiple organ systems failure and requires high complexity decision making for assessment and support, frequent evaluation and titration of therapies, application of advanced monitoring technologies and extensive interpretation of multiple databases. Critical Care Time devoted to patient care services described in this note independent of APP/resident  time is 32 minutes.     Kara Mead MD. [place]. [facility]  If no response to pager , please call [phone]   12/02/2019

## 2019-12-03 DIAGNOSIS — J9620 Acute and chronic respiratory failure, unspecified whether with hypoxia or hypercapnia: Secondary | ICD-10-CM

## 2019-12-03 LAB — HEPATIC FUNCTION PANEL
ALT: 21 U/L (ref 0–44)
AST: 19 U/L (ref 15–41)
Albumin: 2.2 g/dL — ABNORMAL LOW (ref 3.5–5.0)
Alkaline Phosphatase: 113 U/L (ref 38–126)
Bilirubin, Direct: 0.3 mg/dL — ABNORMAL HIGH (ref 0.0–0.2)
Indirect Bilirubin: 0.5 mg/dL (ref 0.3–0.9)
Total Bilirubin: 0.8 mg/dL (ref 0.3–1.2)
Total Protein: 6.5 g/dL (ref 6.5–8.1)

## 2019-12-03 LAB — CBC WITH DIFFERENTIAL/PLATELET
Abs Immature Granulocytes: 0.52 10*3/uL — ABNORMAL HIGH (ref 0.00–0.07)
Basophils Absolute: 0 10*3/uL (ref 0.0–0.1)
Basophils Relative: 0 %
Eosinophils Absolute: 0.2 10*3/uL (ref 0.0–0.5)
Eosinophils Relative: 1 %
HCT: 22.7 % — ABNORMAL LOW (ref 39.0–52.0)
Hemoglobin: 7.4 g/dL — ABNORMAL LOW (ref 13.0–17.0)
Immature Granulocytes: 4 %
Lymphocytes Relative: 6 %
Lymphs Abs: 0.9 10*3/uL (ref 0.7–4.0)
MCH: 29.6 pg (ref 26.0–34.0)
MCHC: 32.6 g/dL (ref 30.0–36.0)
MCV: 90.8 fL (ref 80.0–100.0)
Monocytes Absolute: 1.4 10*3/uL — ABNORMAL HIGH (ref 0.1–1.0)
Monocytes Relative: 10 %
Neutro Abs: 10.8 10*3/uL — ABNORMAL HIGH (ref 1.7–7.7)
Neutrophils Relative %: 79 %
Platelets: 428 10*3/uL — ABNORMAL HIGH (ref 150–400)
RBC: 2.5 MIL/uL — ABNORMAL LOW (ref 4.22–5.81)
RDW: 19.2 % — ABNORMAL HIGH (ref 11.5–15.5)
WBC: 13.8 10*3/uL — ABNORMAL HIGH (ref 4.0–10.5)
nRBC: 2.4 % — ABNORMAL HIGH (ref 0.0–0.2)

## 2019-12-03 LAB — GLUCOSE, CAPILLARY
Glucose-Capillary: 100 mg/dL — ABNORMAL HIGH (ref 70–99)
Glucose-Capillary: 107 mg/dL — ABNORMAL HIGH (ref 70–99)
Glucose-Capillary: 112 mg/dL — ABNORMAL HIGH (ref 70–99)
Glucose-Capillary: 112 mg/dL — ABNORMAL HIGH (ref 70–99)
Glucose-Capillary: 122 mg/dL — ABNORMAL HIGH (ref 70–99)
Glucose-Capillary: 140 mg/dL — ABNORMAL HIGH (ref 70–99)

## 2019-12-03 LAB — RENAL FUNCTION PANEL
Albumin: 2.2 g/dL — ABNORMAL LOW (ref 3.5–5.0)
Anion gap: 15 (ref 5–15)
BUN: 39 mg/dL — ABNORMAL HIGH (ref 6–20)
CO2: 25 mmol/L (ref 22–32)
Calcium: 7.7 mg/dL — ABNORMAL LOW (ref 8.9–10.3)
Chloride: 94 mmol/L — ABNORMAL LOW (ref 98–111)
Creatinine, Ser: 4.07 mg/dL — ABNORMAL HIGH (ref 0.61–1.24)
GFR calc Af Amer: 18 mL/min — ABNORMAL LOW (ref >60)
GFR calc non Af Amer: 16 mL/min — ABNORMAL LOW (ref >60)
Glucose, Bld: 118 mg/dL — ABNORMAL HIGH (ref 70–99)
Phosphorus: 2 mg/dL — ABNORMAL LOW (ref 2.5–4.6)
Potassium: 3.7 mmol/L (ref 3.5–5.1)
Sodium: 134 mmol/L — ABNORMAL LOW (ref 135–145)

## 2019-12-03 NOTE — Progress Notes (Signed)
RT note: patient placed on CPAP/PSV of 10/5 at 0845.  Tolerating well at this time.  Will continue to monitor.

## 2019-12-03 NOTE — Progress Notes (Signed)
Attempted to trail patient on 40% trach collar.  Once patient placed on TC, patient began stating that he could not breath.  Sats and vitals were stable.  Suctioned patient and obtained a moderate amount of thick, bloody secretions.  Changed patient's inner cannula as well and patient continued to state he could not breath.  Placed patient back on ventilator to CPAP/PSV and increased PS to 15 from 10.  Patient stated that it felt better.  Will wean PS throughout the day as patient tolerates.  Patient also stated to RT that he was anxious and scared.  Instructed patient that everything was fine, numbers and vitals were stable.  Patient at ease at this time.  Will continue to monitor.

## 2019-12-03 NOTE — Progress Notes (Signed)
NAME:  [name] [name], MRN:  [id number], DOB:  1966/02/25, LOS: 100 ADMISSION DATE:  11/16/2019, CONSULTATION DATE:  12/2 REFERRING MD:  Dr. Sedonia Small, CHIEF COMPLAINT:  Post cardiac arrest    Brief History   53yo male presented post PEA arrest which occurred during iHD treatment as outpatient. This was a witnessed arrest with quick bystander CPR. Received 3 rounds of epi, 1g of calcium and 50 mEq of bicarb with ROSC after 15 minutes.   Emergent cricothyrotomy performed by EMS due to failed intubation with [facility] airway.   Past Medical History  NSVT 2017 Nonischemic cardiomyopathy Medical noncompliance Polysubstance abuse Hypertension End-stage renal disease Combined systolic and diastolic congestive heart failure Cocaine abuse Alcohol abuse Prolonged QTC  Significant Hospital Events   12/2 Admitted post cardiac arrest, emergent cric, 33 C TTM 12/8 V. tach arrest 12/10 Cric changed to tracheostomy 12/16 HD, tolerated only 30 minutes of trach collar  12/18 - He is critically ill, remains on vent. Soft blood pressure overnight but off pressors Afebrile  Consults:  Nephrology Electrophysiology ENT [place] 12/18   Procedures:  12/10-trach 12/4 RT fem HD cath >> out 12/2 RT [place] CVL >>  Significant Diagnostic Tests:  CTA 12/2-segmental filling defects in the right middle lobe and right lower lobe pulmonary arteries, pneumomediastinum, subcu emphysema CT head 12/8-no acute abnormality  Echocardiogram 11/17/19-LVEF less than 20%, normal LV systolic function    Micro Data:  COVID 12/2 > negative Aspirate 12/04+ for Haemophilus influenza  12/11 Bc >> ng  Antimicrobials:  Unasyn 12/4>>12/7 Ceftriazone 12/7>>12/13  Interim history/subjective:    12/03/2019 - MAP > 65 with slow SBP. Doing PSV via trach. EP wants amio gtt cntinued through 12/21 and then decide on Dc. RN concerned about bilsters on feet and knees. She has called wound care. Not on sedation gtt. Following commands/.  No active bleeding. Diarrhea + per RN  Objective   Blood pressure (!) 86/65, pulse 88, temperature 98.4 F (36.9 C), temperature source Oral, resp. rate 20, height 5' 10.5" (1.791 m), weight 78.8 kg, SpO2 100 %.    Vent Mode: PSV;CPAP FiO2 (%):  [40 %] 40 % Set Rate:  [20 bmp] 20 bmp Vt Set:  [590 mL] 590 mL PEEP:  [5 cmH20] 5 cmH20 Pressure Support:  [10 cmH20] 10 cmH20 Plateau Pressure:  [20 cmH20-23 cmH20] 22 cmH20   Intake/Output Summary (Last 24 hours) at 12/03/2019 1119 Last data filed at 12/03/2019 0700 Gross per 24 hour  Intake 1012.9 ml  Output 1300 ml  Net -287.1 ml   Filed Weights   12/02/19 1410 12/02/19 1740 12/03/19 0500  Weight: 78.9 kg 77.6 kg 78.8 kg    Examination:   General Appearance:  Looks chronic critically ill. Looks stable. Sitting on bed.  Head:  Normocephalic, without obvious abnormality, atraumatic Eyes:  PERRL - yes, conjunctiva/corneas - mddy     Ears:  Normal external ear canals, both ears Nose:  G tube - YES Throat:  ETT TUBE - no , OG tube - no. TRACH + Neck:  Supple,  No enlargement/tenderness/nodules Lungs: Clear to auscultation bilaterally, Ventilator   Synchrony - yes on SPVT Heart:  S1 and S2 normal, no murmur, CVP - no.  Pressors - no Abdomen:  Soft, no masses, no organomegaly Genitalia / Rectal:  Not done Extremities:  Extremities- intact. Knees with dressing. LUE fistula + Skin:  ntact in exposed areas . Sacral area - not examind Neurologic:  Sedation - none -> RASS - +1 . Moves  all 4s - yes. CAM-ICU - neg . Orientation - yes      Resolved Hospital Problem list     Assessment & Plan:  # Cardiac arrest- doubt PE but contrast timing poor, venous duplex negative  Vfib rhythm,  Combined systolic and diastolic CHF, NSVT, prolonged QT  12/03/2019 - Per EP continue amio. Gtt. MPA > 65 with low systolic  Plan -ct amiodarone. -Not a candidate for ICD per EP -May be LifeVest eventually   # Shock - related to bad heart,  renal vasoplegia, persistent  12/03/2019 - maintains MAP > 65 with low sbo and is  -Off steroids -Off pressors, will see if he tolerates dialysis today -Observing off antibiotics, leukocytosis and slight increase in right lower lobe infiltrate, follow   Plan  - monitor  - MAP goal > 65  #Acute respiratory failure, difficult intubation needing cric- s/p  Trach - -Goal is to progress to trach collar,  Hopefully will make more progress after dialysis  12/03/2019 - doing PSV ontrack  Plan PSV as tolerated    # ESRD-intermittent HD per renal, last 12/16 and 12/17  Plan  - per renal  # Hx polysustance abuse- cocaine, alcohol, tobacco -Fentanyl as needed, lowered to 25 mcg  # Ileus- persistent, tolerating tube feeds  #Anemia -?  Of critical illness, no evidence of blood loss, status post 1 unit PRBC 12/17  12/03/2019 - no active bleed but  hgb 7.4  Plan  - - PRBC for hgb </= 6.9gm%    - exceptions are   -  if ACS susepcted/confirmed then transfuse for hgb </= 8.0gm%,  or    -  active bleeding with hemodynamic instability, then transfuse regardless of hemoglobin value   At at all times try to transfuse 1 unit prbc as possible with exception of active hemorrhage    #Ischemic changes of feet -due to low flow, obtain vascular input, now off IV heparin. Seen by VVS 12/18  12/03/2019 - VVS consult concerned that patient iat risk for amputation of lower extremity and blisters are signaling ischemia. Recommending supportive care and await demarcatiin  Plan  - wound care  - VVS consult  - await demarcation    Best practice:  Diet: TFs Pain/Anxiety/Delirium protocol (if indicated): see above VAP protocol (if indicated): yes, ordered DVT prophylaxis: heparin gtt GI prophylaxis: PPI Glucose control: yes less than 180 Foley: none Mobility: bed rest Code Status: Full  Family Communication: son Ludwig Tugwell over phone 12/03/2019  - explained to anticipate slow progress  with bad progress. Did inform son that there is  risk for amputation  Disposition: ICU ->Move to progressive 12/03/2019 and if stable for 1 days can go to triad on progressive   ATTESTATION & SIGNATURE   The patient [name] [name] is critically ill with multiple organ systems failure and requires high complexity decision making for assessment and support, frequent evaluation and titration of therapies, application of advanced monitoring technologies and extensive interpretation of multiple databases.   Critical Care Time devoted to patient care services described in this note is  30  Minutes. This time reflects time of care of this signee Dr Brand Males. This critical care time does not reflect procedure time, or teaching time or supervisory time of PA/NP/Med student/Med Resident etc but could involve care discussion time     Dr. Brand Males, M.D., [facility].C.P Pulmonary and Critical Care Medicine Staff Physician [facility] Pager: [phone], If no  answer or between  15:00h - 7:00h: call [phone]  12/03/2019 11:19 AM     LABS    PULMONARY Recent Labs  Lab 11/28/19 1152  PHART 7.356  PCO2ART 29.8*  PO2ART 148*  HCO3 16.3*  O2SAT 99.3    CBC Recent Labs  Lab 12/01/19 0356 12/01/19 1500 12/02/19 0527 12/03/19 0418  HGB 6.9* 7.7* 7.5* 7.4*  HCT 19.4* 22.0* 22.5* 22.7*  WBC 13.1*  --  14.5* 13.8*  PLT 421*  --  423* 428*    COAGULATION No results for input(s): INR in the last 168 hours.  CARDIAC  No results for input(s): TROPONINI in the last 168 hours. No results for input(s): PROBNP in the last 168 hours.   CHEMISTRY Recent Labs  Lab 11/27/19 0341 11/28/19 0401 11/29/19 7026 11/29/19 2019 11/30/19 0359 11/30/19 2236 12/01/19 0356 12/02/19 0527 12/03/19 0418  NA 133* 130* 134* 133* 132* 135 136 134* 134*  K 3.8 4.0 4.0 3.6 3.9 3.3* 3.8 4.3 3.7  CL 98 96* 99 97* 96* 99 96* 97* 94*  CO2 18*  15* 18* 18* 18* 25 24 24 25   GLUCOSE 112* 109* 129* 140* 124* 106* 128* 115* 118*  BUN 62* 79* 67* 81* 88* 31* 46* 65* 39*  CREATININE 5.25* 6.67* 5.91* 6.61* 7.13* 2.74* 4.41* 5.94* 4.07*  CALCIUM 7.8* 7.7* 7.6* 7.7* 7.8* 7.5* 7.5* 7.5* 7.7*  MG 2.8* 2.8*  --  2.6*  --  2.0  --   --   --   PHOS 3.7 5.2* 5.0*  --  5.8*  --  2.3* 2.3* 2.0*   Estimated Creatinine Clearance: 22.5 mL/min (A) (by C-G formula based on SCr of 4.07 mg/dL (H)).   LIVER Recent Labs  Lab 11/29/19 3785 11/30/19 0359 12/01/19 0356 12/02/19 0527 12/03/19 0418  AST  --   --   --   --  19  ALT  --   --   --   --  21  ALKPHOS  --   --   --   --  113  BILITOT  --   --   --   --  0.8  PROT  --   --   --   --  6.5  ALBUMIN 2.0* 2.1* 2.2* 2.1* 2.2*  2.2*     INFECTIOUS No results for input(s): LATICACIDVEN, PROCALCITON in the last 168 hours.   ENDOCRINE CBG (last 3)  Recent Labs    12/03/19 0054 12/03/19 0418 12/03/19 0810  GLUCAP 100* 107* 122*         IMAGING x48h  - image(s) personally visualized  -   highlighted in bold DG Chest Port 1 View  Result Date: 12/02/2019 CLINICAL DATA:  Acute respiratory failure. Tracheostomy tube present. EXAM: PORTABLE CHEST 1 VIEW COMPARISON:  One-view chest x-ray 11/28/2019 FINDINGS: Tracheostomy tube stable. Right subclavian line was removed. Feeding tube courses off the inferior border of the film. The heart is enlarged. Bilateral effusions and airspace disease are again seen. Airspace disease at the right base is increasing. IMPRESSION: 1. Stable bilateral effusions and airspace disease. 2. Increasing right basilar airspace disease. While this likely reflects atelectasis, infection is also considered. Electronically Signed   By: San Morelle M.D.   On: 12/02/2019 07:52

## 2019-12-03 NOTE — Progress Notes (Signed)
Patient back on IV amiodarone.  Would continue IV amiodarone over the weekend.  Prognosis remains poor.  General cardiology to be available over the weekend if needed.  EP to revisit next week.  Thompson Grayer MD, [facility] 12/03/2019 7:33 AM

## 2019-12-03 NOTE — Progress Notes (Signed)
Patient ID: [name] [name], male   DOB: 04-23-1966, 53 y.o.   MRN: [id number] S: Sitting up in bed in NAD O:BP (!) 86/65   Pulse 88   Temp 98.4 F (36.9 C) (Oral)   Resp 20   Ht 5' 10.5" (1.791 m)   Wt 78.8 kg   SpO2 100%   BMI 24.57 kg/m   Intake/Output Summary (Last 24 hours) at 12/03/2019 0910 Last data filed at 12/03/2019 0700 Gross per 24 hour  Intake 1122.9 ml  Output 1300 ml  Net -177.1 ml   Intake/Output: I/O last 3 completed shifts: In: 1947.9 [P.O.:50; I.V.:572.9; NG/GT:1325] Out: 1800 [Other:1000; Stool:800]  Intake/Output this shift:  No intake/output data recorded. Weight change: 1.2 kg Gen: slowed mentation CVS: tachy at 101 Resp: occ rhonchi Abd: +BS, soft, NT/ND Ext: 1+ edema, ischemic changes to feet bilaterally, painful, bullae present.  Recent Labs  Lab 11/27/19 0341 11/28/19 0401 11/29/19 8811 11/29/19 2019 11/30/19 0359 11/30/19 2236 12/01/19 0356 12/02/19 0527 12/03/19 0418  NA 133* 130* 134* 133* 132* 135 136 134* 134*  K 3.8 4.0 4.0 3.6 3.9 3.3* 3.8 4.3 3.7  CL 98 96* 99 97* 96* 99 96* 97* 94*  CO2 18* 15* 18* 18* 18* 25 24 24 25   GLUCOSE 112* 109* 129* 140* 124* 106* 128* 115* 118*  BUN 62* 79* 67* 81* 88* 31* 46* 65* 39*  CREATININE 5.25* 6.67* 5.91* 6.61* 7.13* 2.74* 4.41* 5.94* 4.07*  ALBUMIN  --   --  2.0*  --  2.1*  --  2.2* 2.1* 2.2*  CALCIUM 7.8* 7.7* 7.6* 7.7* 7.8* 7.5* 7.5* 7.5* 7.7*  PHOS 3.7 5.2* 5.0*  --  5.8*  --  2.3* 2.3* 2.0*   Liver Function Tests: Recent Labs  Lab 12/01/19 0356 12/02/19 0527 12/03/19 0418  ALBUMIN 2.2* 2.1* 2.2*   No results for input(s): LIPASE, AMYLASE in the last 168 hours. No results for input(s): AMMONIA in the last 168 hours. CBC: Recent Labs  Lab 11/29/19 0411 11/30/19 0359 12/01/19 0356 12/01/19 1500 12/02/19 0527 12/03/19 0418  WBC 14.4* 15.0* 13.1*  --  14.5* 13.8*  NEUTROABS 12.9* 11.9* 10.0*  --  11.5* 10.8*  HGB 7.8* 7.8* 6.9* 7.7* 7.5* 7.4*  HCT 21.5* 21.5* 19.4* 22.0*  22.5* 22.7*  MCV 78.5* 80.5 82.2  --  87.9 90.8  PLT 278 398 421*  --  423* 428*   Cardiac Enzymes: No results for input(s): CKTOTAL, CKMB, CKMBINDEX, TROPONINI in the last 168 hours. CBG: Recent Labs  Lab 12/02/19 1553 12/02/19 2105 12/03/19 0054 12/03/19 0418 12/03/19 0810  GLUCAP 116* 114* 100* 107* 122*    Iron Studies: No results for input(s): IRON, TIBC, TRANSFERRIN, FERRITIN in the last 72 hours. Studies/Results: DG Chest Port 1 View  Result Date: 12/02/2019 CLINICAL DATA:  Acute respiratory failure. Tracheostomy tube present. EXAM: PORTABLE CHEST 1 VIEW COMPARISON:  One-view chest x-ray 11/28/2019 FINDINGS: Tracheostomy tube stable. Right subclavian line was removed. Feeding tube courses off the inferior border of the film. The heart is enlarged. Bilateral effusions and airspace disease are again seen. Airspace disease at the right base is increasing. IMPRESSION: 1. Stable bilateral effusions and airspace disease. 2. Increasing right basilar airspace disease. While this likely reflects atelectasis, infection is also considered. Electronically Signed   By: San Morelle M.D.   On: 12/02/2019 07:52   . B-complex with vitamin C  1 tablet Per Tube Daily  . chlorhexidine gluconate (MEDLINE KIT)  15 mL Mouth Rinse BID  .  Chlorhexidine Gluconate Cloth  6 each Topical Daily  . Chlorhexidine Gluconate Cloth  6 each Topical Q0600  . Chlorhexidine Gluconate Cloth  6 each Topical Q0600  . Chlorhexidine Gluconate Cloth  6 each Topical Q0600  . docusate  100 mg Per Tube Daily  . feeding supplement (PRO-STAT SUGAR FREE 64)  30 mL Per Tube TID  . insulin aspart  0-9 Units Subcutaneous Q4H  . mouth rinse  15 mL Mouth Rinse 10 times per day  . midodrine  10 mg Per Tube Q8H  . pantoprazole sodium  40 mg Per Tube QHS  . sodium chloride flush  10-40 mL Intracatheter Q12H  . white petrolatum   Topical Daily    BMET    Component Value Date/Time   NA 134 (L) 12/03/2019 0418   K 3.7  12/03/2019 0418   CL 94 (L) 12/03/2019 0418   CO2 25 12/03/2019 0418   GLUCOSE 118 (H) 12/03/2019 0418   BUN 39 (H) 12/03/2019 0418   CREATININE 4.07 (H) 12/03/2019 0418   CALCIUM 7.7 (L) 12/03/2019 0418   CALCIUM 8.2 (L) 10/21/2010 1420   GFRNONAA 16 (L) 12/03/2019 0418   GFRAA 18 (L) 12/03/2019 0418   CBC    Component Value Date/Time   WBC 13.8 (H) 12/03/2019 0418   RBC 2.50 (L) 12/03/2019 0418   HGB 7.4 (L) 12/03/2019 0418   HGB 12.3 (L) 12/31/2017 1012   HCT 22.7 (L) 12/03/2019 0418   HCT 37.4 (L) 12/31/2017 1012   PLT 428 (H) 12/03/2019 0418   PLT 176 12/31/2017 1012   MCV 90.8 12/03/2019 0418   MCV 99 (H) 12/31/2017 1012   MCH 29.6 12/03/2019 0418   MCHC 32.6 12/03/2019 0418   RDW 19.2 (H) 12/03/2019 0418   RDW 18.8 (H) 12/31/2017 1012   LYMPHSABS 0.9 12/03/2019 0418   LYMPHSABS 1.0 12/31/2017 1012   MONOABS 1.4 (H) 12/03/2019 0418   EOSABS 0.2 12/03/2019 0418   EOSABS 0.1 12/31/2017 1012   BASOSABS 0.0 12/03/2019 0418   BASOSABS 0.0 12/31/2017 1012    HD [facility]: [facility] M[facility] 4h 62mn 74.5kg 2/2.5 bath 340/A1.5 P4 Hep 5000 C3 0.75 qTx no ESA/Fe Hb 12s usually   Background:  Is a 53year old gentleman history of SVT systolic heart failure polysubstance abuse end-stage renal disease [facility] status post PEA arrest 11/16/2019. Occurred about 3-1/2 hours into hemodialysis treatment received 3 rounds of epinephrine 1 g of calcium 50 mEq of bicarbonate with ROSC of about 15 minutes. Emergent cricothyroitomy performed by EMS due to failed intubation. CRRT initiated 11/18/2019.   Assessment/Plan:  1. ESRD- s/p HD on 12/02/19 and had drop in BP to 62/46. BP remains soft and may need pressor support vs continued CVVHD if he cannot tolerate IHD.   2. S/p PEA arrest 12/2 and VT arrest 12/8- cardiology and CHF following.   3. ICHF- not candidate for ICD due to noncompliance and drug  abuse. 4. Cardiogenic shock- off pressors at the moment however BP remains low. 5. Anemia of CKD- s/p blood transfusion 12/01/19.  Cont with ESA q week.  Hgb trending down, transfuse if continues to drop. 6. VDRF- s/p emergent cricothyroidotomy.  Per PCCM 7. H/o polysubstance abuse 8. SHPTH- hold binders due to low phos and npo.    JDonetta Potts MD [employer]([phone]

## 2019-12-04 ENCOUNTER — Inpatient Hospital Stay (HOSPITAL_COMMUNITY): Payer: Medicare Other

## 2019-12-04 DIAGNOSIS — Z7189 Other specified counseling: Secondary | ICD-10-CM

## 2019-12-04 LAB — CBC WITH DIFFERENTIAL/PLATELET
Abs Immature Granulocytes: 0.43 10*3/uL — ABNORMAL HIGH (ref 0.00–0.07)
Basophils Absolute: 0 10*3/uL (ref 0.0–0.1)
Basophils Relative: 0 %
Eosinophils Absolute: 0.2 10*3/uL (ref 0.0–0.5)
Eosinophils Relative: 2 %
HCT: 23.1 % — ABNORMAL LOW (ref 39.0–52.0)
Hemoglobin: 7.6 g/dL — ABNORMAL LOW (ref 13.0–17.0)
Immature Granulocytes: 3 %
Lymphocytes Relative: 7 %
Lymphs Abs: 1 10*3/uL (ref 0.7–4.0)
MCH: 29.5 pg (ref 26.0–34.0)
MCHC: 32.9 g/dL (ref 30.0–36.0)
MCV: 89.5 fL (ref 80.0–100.0)
Monocytes Absolute: 1.3 10*3/uL — ABNORMAL HIGH (ref 0.1–1.0)
Monocytes Relative: 10 %
Neutro Abs: 10.7 10*3/uL — ABNORMAL HIGH (ref 1.7–7.7)
Neutrophils Relative %: 78 %
Platelets: 457 10*3/uL — ABNORMAL HIGH (ref 150–400)
RBC: 2.58 MIL/uL — ABNORMAL LOW (ref 4.22–5.81)
RDW: 18.6 % — ABNORMAL HIGH (ref 11.5–15.5)
WBC: 13.7 10*3/uL — ABNORMAL HIGH (ref 4.0–10.5)
nRBC: 1.3 % — ABNORMAL HIGH (ref 0.0–0.2)

## 2019-12-04 LAB — RENAL FUNCTION PANEL
Albumin: 2.2 g/dL — ABNORMAL LOW (ref 3.5–5.0)
Anion gap: 16 — ABNORMAL HIGH (ref 5–15)
BUN: 65 mg/dL — ABNORMAL HIGH (ref 6–20)
CO2: 24 mmol/L (ref 22–32)
Calcium: 7.4 mg/dL — ABNORMAL LOW (ref 8.9–10.3)
Chloride: 92 mmol/L — ABNORMAL LOW (ref 98–111)
Creatinine, Ser: 5.37 mg/dL — ABNORMAL HIGH (ref 0.61–1.24)
GFR calc Af Amer: 13 mL/min — ABNORMAL LOW (ref >60)
GFR calc non Af Amer: 11 mL/min — ABNORMAL LOW (ref >60)
Glucose, Bld: 113 mg/dL — ABNORMAL HIGH (ref 70–99)
Phosphorus: 2.5 mg/dL (ref 2.5–4.6)
Potassium: 4.3 mmol/L (ref 3.5–5.1)
Sodium: 132 mmol/L — ABNORMAL LOW (ref 135–145)

## 2019-12-04 LAB — GLUCOSE, CAPILLARY
Glucose-Capillary: 103 mg/dL — ABNORMAL HIGH (ref 70–99)
Glucose-Capillary: 107 mg/dL — ABNORMAL HIGH (ref 70–99)
Glucose-Capillary: 112 mg/dL — ABNORMAL HIGH (ref 70–99)
Glucose-Capillary: 113 mg/dL — ABNORMAL HIGH (ref 70–99)
Glucose-Capillary: 118 mg/dL — ABNORMAL HIGH (ref 70–99)
Glucose-Capillary: 122 mg/dL — ABNORMAL HIGH (ref 70–99)
Glucose-Capillary: 134 mg/dL — ABNORMAL HIGH (ref 70–99)

## 2019-12-04 LAB — LACTIC ACID, PLASMA: Lactic Acid, Venous: 1.5 mmol/L (ref 0.5–1.9)

## 2019-12-04 LAB — HEPATIC FUNCTION PANEL
ALT: 18 U/L (ref 0–44)
AST: 16 U/L (ref 15–41)
Albumin: 2.1 g/dL — ABNORMAL LOW (ref 3.5–5.0)
Alkaline Phosphatase: 114 U/L (ref 38–126)
Bilirubin, Direct: 0.3 mg/dL — ABNORMAL HIGH (ref 0.0–0.2)
Indirect Bilirubin: 0.6 mg/dL (ref 0.3–0.9)
Total Bilirubin: 0.9 mg/dL (ref 0.3–1.2)
Total Protein: 6.5 g/dL (ref 6.5–8.1)

## 2019-12-04 NOTE — Progress Notes (Signed)
Pt was placed back on full support mode on the vent from trach collar. Pt was getting SOB and belly breathing. Pt is stable at this time.

## 2019-12-04 NOTE — Progress Notes (Signed)
Patient ID: [name] [name], male   DOB: September 12, 1966, 53 y.o.   MRN: [id number] S: failed trach collar trial yesterday.  Still with thick secretions. O:BP 92/67   Pulse 98   Temp 98.3 F (36.8 C) (Oral)   Resp 18   Ht 5' 10.5" (1.791 m)   Wt 77.7 kg   SpO2 100%   BMI 24.23 kg/m   Intake/Output Summary (Last 24 hours) at 12/04/2019 1000 Last data filed at 12/04/2019 0900 Gross per 24 hour  Intake 712.23 ml  Output 0 ml  Net 712.23 ml   Intake/Output: I/O last 3 completed shifts: In: 1130.9 [I.V.:750.9; NG/GT:380] Out: 100 [Stool:100]  Intake/Output this shift:  Total I/O In: 83.4 [I.V.:33.4; NG/GT:50] Out: -  Weight change: -1.2 kg Gen: sitting up on edge of bed on vent via trach CVS: no rub Resp: scattered rhonchi Abd:+BS, soft, NT/Nd Ext: +ischemic changes to bilateral feet with edema, LUE AVF +T/B  Recent Labs  Lab 11/28/19 0401 11/29/19 2620 11/29/19 2019 11/30/19 0359 11/30/19 2236 12/01/19 0356 12/02/19 0527 12/03/19 0418 12/04/19 0322  NA 130* 134* 133* 132* 135 136 134* 134* 132*  K 4.0 4.0 3.6 3.9 3.3* 3.8 4.3 3.7 4.3  CL 96* 99 97* 96* 99 96* 97* 94* 92*  CO2 15* 18* 18* 18* 25 24 24 25 24   GLUCOSE 109* 129* 140* 124* 106* 128* 115* 118* 113*  BUN 79* 67* 81* 88* 31* 46* 65* 39* 65*  CREATININE 6.67* 5.91* 6.61* 7.13* 2.74* 4.41* 5.94* 4.07* 5.37*  ALBUMIN  --  2.0*  --  2.1*  --  2.2* 2.1* 2.2*  2.2* 2.2*  2.1*  CALCIUM 7.7* 7.6* 7.7* 7.8* 7.5* 7.5* 7.5* 7.7* 7.4*  PHOS 5.2* 5.0*  --  5.8*  --  2.3* 2.3* 2.0* 2.5  AST  --   --   --   --   --   --   --  19 16  ALT  --   --   --   --   --   --   --  21 18   Liver Function Tests: Recent Labs  Lab 12/02/19 0527 12/03/19 0418 12/04/19 0322  AST  --  19 16  ALT  --  21 18  ALKPHOS  --  113 114  BILITOT  --  0.8 0.9  PROT  --  6.5 6.5  ALBUMIN 2.1* 2.2*  2.2* 2.2*  2.1*   No results for input(s): LIPASE, AMYLASE in the last 168 hours. No results for input(s): AMMONIA in the last 168  hours. CBC: Recent Labs  Lab 11/30/19 0359 12/01/19 0356 12/02/19 0527 12/03/19 0418 12/04/19 0322  WBC 15.0* 13.1* 14.5* 13.8* 13.7*  NEUTROABS 11.9* 10.0* 11.5* 10.8* 10.7*  HGB 7.8* 6.9* 7.5* 7.4* 7.6*  HCT 21.5* 19.4* 22.5* 22.7* 23.1*  MCV 80.5 82.2 87.9 90.8 89.5  PLT 398 421* 423* 428* 457*   Cardiac Enzymes: No results for input(s): CKTOTAL, CKMB, CKMBINDEX, TROPONINI in the last 168 hours. CBG: Recent Labs  Lab 12/03/19 1645 12/03/19 2045 12/04/19 0027 12/04/19 0409 12/04/19 0900  GLUCAP 140* 112* 122* 107* 134*    Iron Studies: No results for input(s): IRON, TIBC, TRANSFERRIN, FERRITIN in the last 72 hours. Studies/Results: DG CHEST PORT 1 VIEW  Result Date: 12/04/2019 CLINICAL DATA:  Tracheostomy EXAM: PORTABLE CHEST 1 VIEW COMPARISON:  Two days ago FINDINGS: Tracheostomy tube remains well seated. The feeding tube reaches the stomach. Extensive bilateral airspace disease. Cardiopericardial enlargement and vascular pedicle  widening. No visible pneumothorax. There is layering pleural effusion. IMPRESSION: Stable hardware positioning, extensive airspace disease, and pleural effusions. Electronically Signed   By: Monte Fantasia M.D.   On: 12/04/2019 08:57   . B-complex with vitamin C  1 tablet Per Tube Daily  . chlorhexidine gluconate (MEDLINE KIT)  15 mL Mouth Rinse BID  . Chlorhexidine Gluconate Cloth  6 each Topical Daily  . Chlorhexidine Gluconate Cloth  6 each Topical Q0600  . Chlorhexidine Gluconate Cloth  6 each Topical Q0600  . Chlorhexidine Gluconate Cloth  6 each Topical Q0600  . docusate  100 mg Per Tube Daily  . feeding supplement (PRO-STAT SUGAR FREE 64)  30 mL Per Tube TID  . insulin aspart  0-9 Units Subcutaneous Q4H  . mouth rinse  15 mL Mouth Rinse 10 times per day  . midodrine  10 mg Per Tube Q8H  . pantoprazole sodium  40 mg Per Tube QHS  . sodium chloride flush  10-40 mL Intracatheter Q12H  . white petrolatum   Topical Daily    BMET     Component Value Date/Time   NA 132 (L) 12/04/2019 0322   K 4.3 12/04/2019 0322   CL 92 (L) 12/04/2019 0322   CO2 24 12/04/2019 0322   GLUCOSE 113 (H) 12/04/2019 0322   BUN 65 (H) 12/04/2019 0322   CREATININE 5.37 (H) 12/04/2019 0322   CALCIUM 7.4 (L) 12/04/2019 0322   CALCIUM 8.2 (L) 10/21/2010 1420   GFRNONAA 11 (L) 12/04/2019 0322   GFRAA 13 (L) 12/04/2019 0322   CBC    Component Value Date/Time   WBC 13.7 (H) 12/04/2019 0322   RBC 2.58 (L) 12/04/2019 0322   HGB 7.6 (L) 12/04/2019 0322   HGB 12.3 (L) 12/31/2017 1012   HCT 23.1 (L) 12/04/2019 0322   HCT 37.4 (L) 12/31/2017 1012   PLT 457 (H) 12/04/2019 0322   PLT 176 12/31/2017 1012   MCV 89.5 12/04/2019 0322   MCV 99 (H) 12/31/2017 1012   MCH 29.5 12/04/2019 0322   MCHC 32.9 12/04/2019 0322   RDW 18.6 (H) 12/04/2019 0322   RDW 18.8 (H) 12/31/2017 1012   LYMPHSABS 1.0 12/04/2019 0322   LYMPHSABS 1.0 12/31/2017 1012   MONOABS 1.3 (H) 12/04/2019 0322   EOSABS 0.2 12/04/2019 0322   EOSABS 0.1 12/31/2017 1012   BASOSABS 0.0 12/04/2019 0322   BASOSABS 0.0 12/31/2017 1012     HD OP: [facility] M[facility] 4h 49mn 74.5kg 2/2.5 bath 340/A1.5 P4 Hep 5000 C3 0.75 qTx no ESA/Fe Hb 12s usually   Background:  Is a 53year old gentleman history of SVT systolic heart failure polysubstance abuse end-stage renal disease Monday [facility] post PEA arrest 11/16/2019. Occurred about 3-1/2 hours into hemodialysis treatment received 3 rounds of epinephrine 1 g of calcium 50 mEq of bicarbonate with ROSC of about 15 minutes. Emergent cricothyroitomy performed by EMS due to failed intubation. CRRT initiated 11/18/2019.  Assessment/Plan:  1. ESRD- s/p HD on 12/02/19 and had drop in BP to 62/46. BP remains soft and may need pressor support vs continued CVVHD if he cannot tolerate IHD.  Plan for HD tomorrow as tolerated.  2. S/p PEA arrest 12/2 and VT arrest 12/8- cardiology  and CHF following.  3. ICHF- not candidate for ICD due to noncompliance and drug abuse. 4. Cardiogenic shock- off pressors at the moment however BP remains low. 5. Anemia of CKD- s/p blood transfusion 12/01/19. Cont with ESA q week.  Hgb trending down, transfuse if continues to drop. 6. VDRF- s/p emergent cricothyroidotomy. Per PCCM 7. H/o polysubstance abuse 8. SHPTH- hold binders due to low phos and npo.  Donetta Potts, MD [employer] [phone]

## 2019-12-04 NOTE — Progress Notes (Addendum)
NAME:  [name] [name], MRN:  [id number], DOB:  1966/04/27, LOS: 54 ADMISSION DATE:  11/16/2019, CONSULTATION DATE:  12/2 REFERRING MD:  Dr. Sedonia Small, CHIEF COMPLAINT:  Post cardiac arrest    Brief History   53yo male presented post PEA arrest which occurred during iHD treatment as outpatient. This was a witnessed arrest with quick bystander CPR. Received 3 rounds of epi, 1g of calcium and 50 mEq of bicarb with ROSC after 15 minutes.   Emergent cricothyrotomy performed by EMS due to failed intubation with [facility] airway.   Past Medical History  NSVT 2017 Nonischemic cardiomyopathy Medical noncompliance Polysubstance abuse Hypertension End-stage renal disease Combined systolic and diastolic congestive heart failure Cocaine abuse Alcohol abuse Prolonged QTC  Significant Hospital Events   12/2 Admitted post cardiac arrest, emergent cric, 33 C TTM 12/8 V. tach arrest 12/10 Cric changed to tracheostomy 12/16 HD, tolerated only 30 minutes of trach collar  12/18 - He is critically ill, remains on vent. Soft blood pressure overnight but off pressors Afebrile. Rx HD  12/19 -  MAP > 65 with slow SBP. Doing PSV via trach. EP wants amio gtt cntinued through 12/21 and then decide on Dc. RN concerned about bilsters on feet and knees. She has called wound care. Not on sedation gtt. Following commands/. No active bleeding. Diarrhea + per RN   Consults:  Nephrology Electrophysiology ENT [place] 12/18   Procedures:  12/10-trach 12/4 RT fem HD cath >> out 12/2 RT [place] CVL >>  Significant Diagnostic Tests:  CTA 12/2-segmental filling defects in the right middle lobe and right lower lobe pulmonary arteries, pneumomediastinum, subcu emphysema CT head 12/8-no acute abnormality  Echocardiogram 11/17/19-LVEF less than 20%, normal LV systolic function    Micro Data:  COVID 12/2 > negative Aspirate 12/04+ for Haemophilus influenza  12/11 Bc >> ng  Antimicrobials:  Unasyn 12/4>>12/7 Ceftriazone  12/7>>12/13  Interim history/subjective:    12/04/2019 -VVS recommends continued monitoring of LE and allowed necrosis to demarcate. Patient reportedly opposed to idea of amputaiton. Failed ATC and back on full vent. Amio gtt cotinues.  Afebrile.  Renam planning repeat HD v CRRT with pressor support 12/05/19 based on BP. Denies complaints other than LE pain. On TF   Objective   Blood pressure 116/90, pulse 96, temperature 98.3 F (36.8 C), temperature source Oral, resp. rate (!) 24, height 5' 10.5" (1.791 m), weight 77.7 kg, SpO2 97 %.    Vent Mode: PRVC FiO2 (%):  [40 %] 40 % Set Rate:  [20 bmp] 20 bmp Vt Set:  [590 mL] 590 mL PEEP:  [5 cmH20] 5 cmH20 Pressure Support:  [10 KWI09-73 cmH20] 10 cmH20 Plateau Pressure:  [15 cmH20-23 cmH20] 16 cmH20   Intake/Output Summary (Last 24 hours) at 12/04/2019 1318 Last data filed at 12/04/2019 1100 Gross per 24 hour  Intake 605.7 ml  Output 0 ml  Net 605.7 ml   Filed Weights   12/02/19 1740 12/03/19 0500 12/04/19 0500  Weight: 77.6 kg 78.8 kg 77.7 kg    Examination:  General Appearance:  Looks criticall ill chronic ill Head:  Normocephalic, without obvious abnormality, atraumatic Eyes:  PERRL - yes, conjunctiva/corneas - muddy     Ears:  Normal external ear canals, both ears Nose:  G tube - YES Throat:  ETT TUBE - no , OG tube - no. TRACH +. TRACH SITE - mild ulceration + Neck:  Supple,  No enlargement/tenderness/nodules Lungs: Clear to auscultation bilaterally, Ventilator   Synchrony - yes Heart:  S1  and S2 normal, no murmur, CVP - no.  Pressors - no. On AMio gtt Abdomen:  Soft, no masses, no organomegaly Genitalia / Rectal:  Not done Extremities:  Extremities- intact with blisters under dressing on feet and knee Skin:  ntact in exposed areas . Sacral area - not examined Neurologic:  Sedation - none -> RASS - +1 . Moves all 4s - yes. CAM-ICU - neg . Orientation - able to follow commands        Resolved Hospital Problem  list     Assessment & Plan:  # Cardiac arrest- doubt PE but contrast timing poor, venous duplex negative  Vfib rhythm,  Combined systolic and diastolic CHF, NSVT, prolonged QT  12/04/2019 - Per EP continue amio goat. Needs to be readdressed 12/05/2019 with EP/Cards. Goal MAP> 65 (has low systolic)  Plan -ct amiodarone. -Not a candidate for ICD per EP -May be LifeVest eventually   # Shock - related to bad heart, renal vasoplegia, persistent  12/04/2019 - maintains MAP > 65 with low sbpand is  -Off steroids -Off pressors, will see if he tolerates dialysis today -Observing off antibiotics, leukocytosis and slight increase in right lower lobe infiltrate, follow   Plan  - monitor  - MAP goal > 65  #Acute on chronic respiratory failure, difficult intubation needing cric- s/p  Trach - -Goal is to progress to trach collar,  Hopefully will make more progress after dialysis  12/04/2019 - failed ATC. Back on full vent support  Plan PSV as tolerated Full vent support ATC with care    # ESRD-intermittent HD per renal, last 12/16 and 12/18  Plan  - per renal - HD v CRRT 12/05/2019   # Hx polysustance abuse- cocaine, alcohol, tobacco  12/20 - has pain LE  Plan -Fentanyl as needed, lowered to 25 mcg  # Ileus- diagnosed 11/23/2019. Last notd KUB 11/24/2001. Not reported on KUB 11/29/2019.   12/04/2019 - abd soft without tenderness. And tolerating TF  Plan  - continue TF with monitoring - repeat KUB 12/05/2019  #Anemia -?  Of critical illness, no evidence of blood loss, status post 1 unit PRBC 12/17  12/04/2019 - no active bleed;  hgb 7.6  Plan  - - PRBC for hgb </= 6.9gm%    - exceptions are   -  if ACS susepcted/confirmed then transfuse for hgb </= 8.0gm%,  or    -  active bleeding with hemodynamic instability, then transfuse regardless of hemoglobin value   At at all times try to transfuse 1 unit prbc as possible with exception of active hemorrhage    #Ischemic  changes of feet -due to low flow, obtain vascular input, now off IV heparin. Seen by VVS 12/18  12/04/2019 - VVS consult concerned that patient iat risk for amputation of lower extremity and blisters are signaling ischemia. Recommending supportive care and await demarcatiin. Patient not interested in amputation  Plan  - wound care  - VVS consult  - await demarcation    Best practice:  Diet: TFs Pain/Anxiety/Delirium protocol (if indicated): see above VAP protocol (if indicated): yes, ordered DVT prophylaxis: heparin gtt GI prophylaxis: PPI Glucose control: yes less than 180 Foley: none Mobility: bed rest Code Status: Full (hold off pall care)  Family Communication: son Aidenn Skellenger over phone 12/03/2019  - explained to anticipate slow progress with bad progress. Did inform son that there is  risk for amputation. Updated same on 12/04/2019  Disposition: ICU. Consider progressive for 12/05/2019  ATTESTATION & SIGNATURE   The patient Armonie Staten is critically ill with multiple organ systems failure and requires high complexity decision making for assessment and support, frequent evaluation and titration of therapies, application of advanced monitoring technologies and extensive interpretation of multiple databases.   Critical Care Time devoted to patient care services described in this note is  30  Minutes. This time reflects time of care of this signee Dr Brand Males. This critical care time does not reflect procedure time, or teaching time or supervisory time of PA/NP/Med student/Med Resident etc but could involve care discussion time     Dr. Brand Males, M.D., [facility].C.P Pulmonary and Critical Care Medicine Staff Physician [facility] Pager: [phone], If no answer or between  15:00h - 7:00h: call [phone]  12/04/2019 1:39 PM         LABS    PULMONARY Recent Labs  Lab 11/28/19 1152  PHART  7.356  PCO2ART 29.8*  PO2ART 148*  HCO3 16.3*  O2SAT 99.3    CBC Recent Labs  Lab 12/02/19 0527 12/03/19 0418 12/04/19 0322  HGB 7.5* 7.4* 7.6*  HCT 22.5* 22.7* 23.1*  WBC 14.5* 13.8* 13.7*  PLT 423* 428* 457*    COAGULATION No results for input(s): INR in the last 168 hours.  CARDIAC  No results for input(s): TROPONINI in the last 168 hours. No results for input(s): PROBNP in the last 168 hours.   CHEMISTRY Recent Labs  Lab 11/28/19 0401 11/29/19 2019 11/30/19 0359 11/30/19 2236 12/01/19 0356 12/02/19 0527 12/03/19 0418 12/04/19 0322  NA 130* 133* 132* 135 136 134* 134* 132*  K 4.0 3.6 3.9 3.3* 3.8 4.3 3.7 4.3  CL 96* 97* 96* 99 96* 97* 94* 92*  CO2 15* 18* 18* 25 24 24 25 24   GLUCOSE 109* 140* 124* 106* 128* 115* 118* 113*  BUN 79* 81* 88* 31* 46* 65* 39* 65*  CREATININE 6.67* 6.61* 7.13* 2.74* 4.41* 5.94* 4.07* 5.37*  CALCIUM 7.7* 7.7* 7.8* 7.5* 7.5* 7.5* 7.7* 7.4*  MG 2.8* 2.6*  --  2.0  --   --   --   --   PHOS 5.2*  --  5.8*  --  2.3* 2.3* 2.0* 2.5   Estimated Creatinine Clearance: 17.1 mL/min (A) (by C-G formula based on SCr of 5.37 mg/dL (H)).   LIVER Recent Labs  Lab 11/30/19 0359 12/01/19 0356 12/02/19 0527 12/03/19 0418 12/04/19 0322  AST  --   --   --  19 16  ALT  --   --   --  21 18  ALKPHOS  --   --   --  113 114  BILITOT  --   --   --  0.8 0.9  PROT  --   --   --  6.5 6.5  ALBUMIN 2.1* 2.2* 2.1* 2.2*  2.2* 2.2*  2.1*     INFECTIOUS Recent Labs  Lab 12/04/19 0322  LATICACIDVEN 1.5     ENDOCRINE CBG (last 3)  Recent Labs    12/04/19 0027 12/04/19 0409 12/04/19 0900  GLUCAP 122* 107* 134*         IMAGING x48h  - image(s) personally visualized  -   highlighted in bold DG CHEST PORT 1 VIEW  Result Date: 12/04/2019 CLINICAL DATA:  Tracheostomy EXAM: PORTABLE CHEST 1 VIEW COMPARISON:  Two days ago FINDINGS: Tracheostomy tube remains well seated. The feeding tube reaches the stomach. Extensive bilateral  airspace  disease. Cardiopericardial enlargement and vascular pedicle widening. No visible pneumothorax. There is layering pleural effusion. IMPRESSION: Stable hardware positioning, extensive airspace disease, and pleural effusions. Electronically Signed   By: Monte Fantasia M.D.   On: 12/04/2019 08:57

## 2019-12-04 NOTE — Progress Notes (Signed)
Placed back on trach collar 40%/10L. Pt is anxious, however, stable at this time. RN aware. RT will monitor.

## 2019-12-04 NOTE — Progress Notes (Signed)
Trach sutures were removed per MD without complications.

## 2019-12-04 NOTE — Consult Note (Signed)
[place] Nurse Consult Note: Patient receiving care in [facility] 2H13.  Consult completed remotely after review of record.  Patient seen two days ago by my colleague, M. Austin. Reason for Consult: Update on wound orders for feet/legs/trach Wound type: Legs and feet are ischemic per MD notes.   Pressure Injury POA: Yes/No/NA Measurement: To be provided by the bedside RN in the flowsheet section.  Expect the feet and leg wounds to worsen as the ischemic area becomes more demarcated. Wound bed, Drainage (amount, consistency, odor), Periwound: To be provided by the bedside RN in the flowsheet section Dressing procedure/placement/frequency: Place enough Xeroform Kellie Simmering 294) over feet and ankle wounds to cover each. Top with ABD pads. Wrap in kerlex.  The punched out wound orders were modified to reflect wrapping the legs in kerlex.  The trach wound order was modified to placement of a Drawtex Trach dressing Kellie Simmering [phone]) each shift. Monitor the wound area(s) for worsening of condition such as: Signs/symptoms of infection,  Increase in size,  Development of or worsening of odor, Development of pain, or increased pain at the affected locations.  Notify the medical team if any of these develop.  Thank you for the consult.  [place] nurse will not follow at this time.  Please re-consult the [place] team if needed.  Val Riles, RN, MSN, CWOCN, CNS-BC, pager [phone]

## 2019-12-04 NOTE — Progress Notes (Signed)
Pt. Complaining of pain at and above IV site running Amiodarone. No obvious signs of infiltration. Amio paused, IV attempted without success, IV team consulted

## 2019-12-04 NOTE — Progress Notes (Signed)
    Subjective  -   Sitting up on the edge of the bed   Physical Exam:  No change in appearance of bilateral knee wounds and lower extremity blistering with signs of ischemia       Assessment/Plan:    Lower extremity vascular ischemia, bilateral secondary to cardiac arrest and need for pressors.  Patient has wounds on his knees as well as blistering and ischemic changes to his feet.  He has brisk Doppler signals to both feet.  As long as there are no significant concerns with infection, I would let his feet demarcate.  We discussed the possibility of bilateral amputations today, which he is opposed to.  I think it is reasonable to continue to closely monitor his feet, as long as he does not show significant signs of infection.  [place]s Janice Seales 12/04/2019 11:50 AM --  Vitals:   12/04/19 1100 12/04/19 1148  BP: 92/70 103/76  Pulse: 87 95  Resp: 16 20  Temp:    SpO2: 99% 94%    Intake/Output Summary (Last 24 hours) at 12/04/2019 1150 Last data filed at 12/04/2019 1100 Gross per 24 hour  Intake 805.51 ml  Output 0 ml  Net 805.51 ml     Laboratory CBC    Component Value Date/Time   WBC 13.7 (H) 12/04/2019 0322   HGB 7.6 (L) 12/04/2019 0322   HGB 12.3 (L) 12/31/2017 1012   HCT 23.1 (L) 12/04/2019 0322   HCT 37.4 (L) 12/31/2017 1012   PLT 457 (H) 12/04/2019 0322   PLT 176 12/31/2017 1012    BMET    Component Value Date/Time   NA 132 (L) 12/04/2019 0322   K 4.3 12/04/2019 0322   CL 92 (L) 12/04/2019 0322   CO2 24 12/04/2019 0322   GLUCOSE 113 (H) 12/04/2019 0322   BUN 65 (H) 12/04/2019 0322   CREATININE 5.37 (H) 12/04/2019 0322   CALCIUM 7.4 (L) 12/04/2019 0322   CALCIUM 8.2 (L) 10/21/2010 1420   GFRNONAA 11 (L) 12/04/2019 0322   GFRAA 13 (L) 12/04/2019 0322    COAG Lab Results  Component Value Date   INR 1.6 (H) 11/16/2019   INR 1.5 (H) 11/16/2019   INR 1.36 10/27/2018   No results found for: PTT  Antibiotics Anti-infectives (From admission,  onward)   Start     Dose/Rate Route Frequency Ordered Stop   11/22/19 1800  cefTRIAXone (ROCEPHIN) 2 g in sodium chloride 0.9 % 100 mL IVPB     2 g 200 mL/hr over 30 Minutes Intravenous Every 24 hours 11/22/19 0723 11/27/19 1820   11/18/19 1300  Ampicillin-Sulbactam (UNASYN) 3 g in sodium chloride 0.9 % 100 mL IVPB  Status:  Discontinued     3 g 200 mL/hr over 30 Minutes Intravenous Every 8 hours 11/18/19 1230 11/22/19 0722   11/18/19 1200  ampicillin-sulbactam (UNASYN) 1.5 g in sodium chloride 0.9 % 100 mL IVPB  Status:  Discontinued     1.5 g 200 mL/hr over 30 Minutes Intravenous Every 12 hours 11/18/19 1150 11/18/19 1230       V. Leia Alf, M.D., [facility] [place] Office: [phone] Pager:  [phone]

## 2019-12-05 ENCOUNTER — Inpatient Hospital Stay (HOSPITAL_COMMUNITY): Payer: Medicare Other

## 2019-12-05 DIAGNOSIS — Z930 Tracheostomy status: Secondary | ICD-10-CM

## 2019-12-05 LAB — RENAL FUNCTION PANEL
Albumin: 2.2 g/dL — ABNORMAL LOW (ref 3.5–5.0)
Anion gap: 15 (ref 5–15)
BUN: 85 mg/dL — ABNORMAL HIGH (ref 6–20)
CO2: 24 mmol/L (ref 22–32)
Calcium: 7.4 mg/dL — ABNORMAL LOW (ref 8.9–10.3)
Chloride: 92 mmol/L — ABNORMAL LOW (ref 98–111)
Creatinine, Ser: 6.65 mg/dL — ABNORMAL HIGH (ref 0.61–1.24)
GFR calc Af Amer: 10 mL/min — ABNORMAL LOW (ref >60)
GFR calc non Af Amer: 9 mL/min — ABNORMAL LOW (ref >60)
Glucose, Bld: 116 mg/dL — ABNORMAL HIGH (ref 70–99)
Phosphorus: 2.8 mg/dL (ref 2.5–4.6)
Potassium: 5.1 mmol/L (ref 3.5–5.1)
Sodium: 131 mmol/L — ABNORMAL LOW (ref 135–145)

## 2019-12-05 LAB — CBC WITH DIFFERENTIAL/PLATELET
Abs Immature Granulocytes: 0.43 10*3/uL — ABNORMAL HIGH (ref 0.00–0.07)
Basophils Absolute: 0.1 10*3/uL (ref 0.0–0.1)
Basophils Relative: 0 %
Eosinophils Absolute: 0.3 10*3/uL (ref 0.0–0.5)
Eosinophils Relative: 2 %
HCT: 24.8 % — ABNORMAL LOW (ref 39.0–52.0)
Hemoglobin: 8.1 g/dL — ABNORMAL LOW (ref 13.0–17.0)
Immature Granulocytes: 3 %
Lymphocytes Relative: 7 %
Lymphs Abs: 1.1 10*3/uL (ref 0.7–4.0)
MCH: 29.2 pg (ref 26.0–34.0)
MCHC: 32.7 g/dL (ref 30.0–36.0)
MCV: 89.5 fL (ref 80.0–100.0)
Monocytes Absolute: 1.6 10*3/uL — ABNORMAL HIGH (ref 0.1–1.0)
Monocytes Relative: 11 %
Neutro Abs: 11.1 10*3/uL — ABNORMAL HIGH (ref 1.7–7.7)
Neutrophils Relative %: 77 %
Platelets: 476 10*3/uL — ABNORMAL HIGH (ref 150–400)
RBC: 2.77 MIL/uL — ABNORMAL LOW (ref 4.22–5.81)
RDW: 18.4 % — ABNORMAL HIGH (ref 11.5–15.5)
WBC: 14.7 10*3/uL — ABNORMAL HIGH (ref 4.0–10.5)
nRBC: 0.7 % — ABNORMAL HIGH (ref 0.0–0.2)

## 2019-12-05 LAB — GLUCOSE, CAPILLARY
Glucose-Capillary: 101 mg/dL — ABNORMAL HIGH (ref 70–99)
Glucose-Capillary: 104 mg/dL — ABNORMAL HIGH (ref 70–99)
Glucose-Capillary: 108 mg/dL — ABNORMAL HIGH (ref 70–99)
Glucose-Capillary: 114 mg/dL — ABNORMAL HIGH (ref 70–99)
Glucose-Capillary: 124 mg/dL — ABNORMAL HIGH (ref 70–99)
Glucose-Capillary: 94 mg/dL (ref 70–99)

## 2019-12-05 MED ORDER — CHLORHEXIDINE GLUCONATE 0.12 % MT SOLN
OROMUCOSAL | Status: AC
  Administered 2019-12-05: 15 mL via OROMUCOSAL
  Filled 2019-12-05: qty 15

## 2019-12-05 MED ORDER — DARBEPOETIN ALFA 100 MCG/0.5ML IJ SOSY
100.0000 ug | PREFILLED_SYRINGE | INTRAMUSCULAR | Status: DC
  Filled 2019-12-05 (×3): qty 0.5

## 2019-12-05 MED ORDER — ALPRAZOLAM 0.5 MG PO TABS
1.0000 mg | ORAL_TABLET | Freq: Three times a day (TID) | ORAL | Status: DC | PRN
  Administered 2019-12-05 – 2019-12-22 (×34): 1 mg via ORAL
  Filled 2019-12-05 (×35): qty 2

## 2019-12-05 MED ORDER — HEPARIN SODIUM (PORCINE) 5000 UNIT/ML IJ SOLN
5000.0000 [IU] | Freq: Three times a day (TID) | INTRAMUSCULAR | Status: DC
  Administered 2019-12-05 – 2019-12-07 (×6): 5000 [IU] via SUBCUTANEOUS
  Filled 2019-12-05 (×5): qty 1

## 2019-12-05 MED ORDER — LORAZEPAM 2 MG/ML IJ SOLN
1.0000 mg | Freq: Once | INTRAMUSCULAR | Status: AC
  Administered 2019-12-05: 1 mg via INTRAVENOUS
  Filled 2019-12-05: qty 1

## 2019-12-05 NOTE — Progress Notes (Addendum)
Patient currently on trach collar. Vital signs WNL. Breathing regular, unlabored. Patient states he is scared, feels like he cannot breathe, and wants to be placed back on the ventilator. RT at bedside. RT and RN discussed with patient at length the importance of staying off the ventilator for as long as possible to get stronger. Paged Dr. Ruthann Cancer for anxiety meds.

## 2019-12-05 NOTE — Progress Notes (Addendum)
Progress Note  Patient Name: [name] [name] Date of Encounter: 12/05/2019  Primary Cardiologist: Fransico Him, MD   Subjective   On vent via trach, wakes easily, denies pain anywhere, getting HD currently  Inpatient Medications    Scheduled Meds: . B-complex with vitamin C  1 tablet Per Tube Daily  . chlorhexidine gluconate (MEDLINE KIT)  15 mL Mouth Rinse BID  . Chlorhexidine Gluconate Cloth  6 each Topical Daily  . Chlorhexidine Gluconate Cloth  6 each Topical Q0600  . Chlorhexidine Gluconate Cloth  6 each Topical Q0600  . Chlorhexidine Gluconate Cloth  6 each Topical Q0600  . [START ON 12/06/2019] darbepoetin (ARANESP) injection - DIALYSIS  100 mcg Intravenous Q Tue-HD  . docusate  100 mg Per Tube Daily  . feeding supplement (PRO-STAT SUGAR FREE 64)  30 mL Per Tube TID  . heparin injection (subcutaneous)  5,000 Units Subcutaneous Q8H  . insulin aspart  0-9 Units Subcutaneous Q4H  . mouth rinse  15 mL Mouth Rinse 10 times per day  . midodrine  10 mg Per Tube Q8H  . pantoprazole sodium  40 mg Per Tube QHS  . sodium chloride flush  10-40 mL Intracatheter Q12H  . white petrolatum   Topical Daily   Continuous Infusions: . sodium chloride Stopped (12/01/19 1424)  . amiodarone 30 mg/hr (12/05/19 1000)  . feeding supplement (VITAL 1.5 CAL) 1,000 mL (12/04/19 1645)   PRN Meds: sodium chloride, acetaminophen (TYLENOL) oral liquid 160 mg/5 mL, acetaminophen, ALPRAZolam, fentaNYL (SUBLIMAZE) injection, oxyCODONE, prochlorperazine, sodium chloride flush   Vital Signs    Vitals:   12/05/19 0930 12/05/19 0936 12/05/19 0948 12/05/19 1000  BP: (!) 155/76  124/79 (!) 147/87  Pulse: (!) 107  93 90  Resp: (!) _0 Temp:      TempSrc:      SpO2: 90% 100% 96%   Weight:      Height:        Intake/Output Summary (Last 24 hours) at 12/05/2019 1008 Last data filed at 12/05/2019 1000 Gross per 24 hour  Intake 1617.18 ml  Output --  Net 1617.18 ml   Filed Weights    12/03/19 0500 12/04/19 0500 12/05/19 0900  Weight: 78.8 kg 77.7 kg 81.6 kg    Telemetry    AFib 80's, occ PVCs, infrequent couplet, 3-4 beat NSVT - Personally Reviewed  ECG    12/02/2019 regular rhythm, IVCD, 113bpm, difficult though likely sinus,  Manually measured QT 438m, QRS 160-1880m  Given broad QRS, QTc 5358mPersonally Reviewed  Physical Exam   GEN: restig comfortably, easily woken, appears in no acute distress.   Neck: trach tube in place Cardiac: RRR, no murmurs, rubs, or gallops.  Respiratory: some scattered rhonchi b/l. GI: Soft MS: b/l LE dressed Neuro:  awake, follows commands, denies pain anywhere Psych: difficult to assess  Labs    Chemistry Recent Labs  Lab 12/03/19 0418 12/04/19 0322 12/05/19 0215  NA 134* 132* 131*  K 3.7 4.3 5.1  CL 94* 92* 92*  CO2 _1 GLUCOSE 118* 113* 116*  BUN 39* 65* 85*  CREATININE 4.07* 5.37* 6.65*  CALCIUM 7.7* 7.4* 7.4*  PROT 6.5 6.5  --   ALBUMIN 2.2*  2.2* 2.2*  2.1* 2.2*  AST 19 16  --   ALT 21 18  --   ALKPHOS 113 114  --   BILITOT 0.8 0.9  --   GFRNONAA 16* 11* 9*  GFRAA 18* 13* 10*  ANIONGAP 15 16* 15     Hematology Recent Labs  Lab 12/03/19 0418 12/04/19 0322 12/05/19 0215  WBC 13.8* 13.7* 14.7*  RBC 2.50* 2.58* 2.77*  HGB 7.4* 7.6* 8.1*  HCT 22.7* 23.1* 24.8*  MCV 90.8 89.5 89.5  MCH 29.6 29.5 29.2  MCHC 32.6 32.9 32.7  RDW 19.2* 18.6* 18.4*  PLT 428* 457* 476*    Cardiac EnzymesNo results for input(s): TROPONINI in the last 168 hours. No results for input(s): TROPIPOC in the last 168 hours.   BNPNo results for input(s): BNP, PROBNP in the last 168 hours.   DDimer No results for input(s): DDIMER in the last 168 hours.   Radiology    DG CHEST PORT 1 VIEW Result Date: 12/04/2019 CLINICAL DATA:  Tracheostomy EXAM: PORTABLE CHEST 1 VIEW COMPARISON:  Two days ago FINDINGS: Tracheostomy tube remains well seated. The feeding tube reaches the stomach. Extensive bilateral airspace  disease. Cardiopericardial enlargement and vascular pedicle widening. No visible pneumothorax. There is layering pleural effusion. IMPRESSION: Stable hardware positioning, extensive airspace disease, and pleural effusions. Electronically Signed   By: Monte Fantasia M.D.   On: 12/04/2019 08:57    DG Abd Portable 1V Result Date: 12/05/2019 CLINICAL DATA:  Ileus EXAM: PORTABLE ABDOMEN - 1 VIEW COMPARISON:  November 29, 2019 FINDINGS: Feeding tube tip is in the third portion of the duodenum. There is no bowel dilatation or air-fluid level to suggest bowel obstruction. No free air. There is airspace opacity in the lung bases. IMPRESSION: No bowel obstruction or free air. Feeding tube tip in third portion of duodenum. Infiltrate in lung bases. Electronically Signed   By: Lowella Grip III M.D.   On: 12/05/2019 06:55    Cardiac Studies   Echo 11/11/19: Study Conclusions - Left ventricle: The cavity size was mildly dilated. Systolic function was severely reduced. The estimated ejection fraction was in the range of 25% to 30%. Diffuse hypokinesis. Although no diagnostic regional wall motion abnormality was identified, this possibility cannot be completely excluded on the basis of this study. Features are consistent with a pseudonormal left ventricular filling pattern, with concomitant abnormal relaxation and increased filling pressure (grade 2 diastolic dysfunction). - Aortic valve: Transvalvular velocity was within the normal range. There was no stenosis. There was no regurgitation. - Mitral valve: Transvalvular velocity was within the normal range. There was no evidence for stenosis. There was trivial regurgitation. - Left atrium: The atrium was severely dilated. - Right ventricle: The cavity size was moderately dilated. Wall thickness was normal. Systolic function was moderately reduced. RV systolic pressure (S, est): 47 mm Hg. - Right atrium: The atrium was  moderately dilated. - Tricuspid valve: There was mild-moderate regurgitation. - Pulmonic valve: There was trivial regurgitation. - Pulmonary arteries: The main pulmonary artery was mildly dilated. Systolic pressure was moderately increased. PA peak pressure: 47 mm Hg (S). - Inferior vena cava: The vessel was dilated. The respirophasic diameter changes were blunted (<50%), consistent with elevated central venous pressure. - Pericardium, extracardiac: There was no pericardial effusion.   02/25/2016: LHC . No angiographic evidence of CAD 2. Moderate pulmonary HTN 3. Elevated LV filling pressures.    Patient Profile     53 y.o. male with a hx of chronic CHF (combined), ESRF onHD, poly substance abuse (ETOH, cocaine), h/o VT in setting of cocaine, HTN urgency (2011), lost to cardiology follow up since 2018  Admitted to [facility] initially after a PEA arrest while on HD 11/16/2019 Subsequently had a VT arrest here 11/22/2019  emergent cricothyrotomy by EMS prior to arrival >> trach >> remains on vent  Remained pressor dependent for much of his hospitalization >> off 11/30/2019, on midodrine  He has developed b/l leg (knees) feet wounds/blisters >> vascular service saw 12/02/2019, suspect 2/2 long requirement on pressors, and that he will need b/l amputations, though following conservatively  He was transitioned to PO amiodarone though had had recurrent slower VT last week and back on amio gtt 12/01/2019   Remains with feeding tube  Assessment & Plan     1.  VT 2. NICM  NSVT 3-4 beats on the last 24 hours, no sustained VT, some PVCs  Continue IV amiodarone currently he remains not an ICD candidate Keep Mg >1.8, K >3.9  Last EKG reviewed, QTc accounting for QRS is 528m, OK for amio, try to avoid other potentially QT prolonging drugs  Might transition tomorrow to PO His BP looks better though on midodrine Very poor prognosis, will discuss idea of ? BB to his regime for  his CM and VT  Volume managed with dialysis   3. Paroxysmal atrial fibrillation 4. PE on arrival ?     On amio (for his VT), though probably also helping rate control his AFib       He had been on heparin gtt, stopped 12/17, it seem CCM felt low probability for PE and Hgb drifing downward     Recommend a/c of CCM choice when/if felt able (discussed with them today)   For questions or updates, please contact CEarlstonPlease consult [email or web address] for contact info under Cardiology/STEMI.   Signed, RBaldwin Jamaica PA-C  12/05/2019, 10:08 AM  Patient ID: MYvetta Coder male   DOB: [birth year] 53y.o.   MRN: [id number] EP Attending  Patient seen and examined. Agree with the findings as noted above. He maintains NSR on IV amiodarone. He is undergoing HD during my visit. He probably could be switched over to oral amio down his NG tube.  GMikle BosworthD.

## 2019-12-05 NOTE — Progress Notes (Addendum)
NAME:  [name] [name], MRN:  [id number], DOB:  04-Aug-1966, LOS: 108 ADMISSION DATE:  11/16/2019, CONSULTATION DATE:  12/2 REFERRING MD:  Dr. Sedonia Small, CHIEF COMPLAINT:  Post cardiac arrest    Brief History   53yo male presented post PEA arrest which occurred during iHD treatment as outpatient. This was a witnessed arrest with quick bystander CPR. Received 3 rounds of epi, 1g of calcium and 50 mEq of bicarb with ROSC after 15 minutes.   Emergent cricothyrotomy performed by EMS due to failed intubation with [facility] airway.   Past Medical History  NSVT 2017 Nonischemic cardiomyopathy Medical noncompliance Polysubstance abuse Hypertension End-stage renal disease Combined systolic and diastolic congestive heart failure Cocaine abuse Alcohol abuse Prolonged QTC  Significant Hospital Events   12/2 Admitted post cardiac arrest, emergent cric, 33 C TTM 12/8 V. tach arrest 12/10 Cric changed to tracheostomy 12/16 HD, tolerated only 30 minutes of trach collar  12/18 - He is critically ill, remains on vent. Soft blood pressure overnight but off pressors Afebrile. Rx HD  12/19 -  MAP > 65 with slow SBP. Doing PSV via trach. EP wants amio gtt cntinued through 12/21 and then decide on Dc. RN concerned about bilsters on feet and knees. She has called wound care. Not on sedation gtt. Following commands/. No active bleeding. Diarrhea + per RN   Consults:  Nephrology Electrophysiology ENT [place] 12/18   Procedures:  12/10-trach 12/4 RT fem HD cath >> out 12/2 RT [place] CVL >>  Significant Diagnostic Tests:  CTA 12/2-segmental filling defects in the right middle lobe and right lower lobe pulmonary arteries, pneumomediastinum, subcu emphysema CT head 12/8-no acute abnormality  Echocardiogram 11/17/19-LVEF less than 20%, normal LV systolic function    Micro Data:  COVID 12/2 > negative Aspirate 12/04+ for Haemophilus influenza  12/11 Bc >> ng  Antimicrobials:  Unasyn 12/4>>12/7 Ceftriazone  12/7>>12/13  Interim history/subjective:    12/05/2019 -called by RN for increased anxiety when attempted to come off vent on ATC. Pt with increased wob on atc, getting dialysis at this time.   12/20: VVS recommends continued monitoring of LE and allowed necrosis to demarcate. Patient reportedly opposed to idea of amputaiton. Failed ATC and back on full vent. Amio gtt cotinues.  Afebrile.  Renam planning repeat HD v CRRT with pressor support 12/06/19 based on BP. Denies complaints other than LE pain. On TF   Objective   Blood pressure (!) 150/76, pulse 98, temperature 98.1 F (36.7 C), temperature source Oral, resp. rate (!) 23, height 5' 10.5" (1.791 m), weight 77.7 kg, SpO2 95 %.    Vent Mode: PRVC FiO2 (%):  [40 %] 40 % Set Rate:  [20 bmp] 20 bmp Vt Set:  [590 mL] 590 mL PEEP:  [5 cmH20] 5 cmH20 Plateau Pressure:  [16 cmH20-23 cmH20] 22 cmH20   Intake/Output Summary (Last 24 hours) at 12/05/2019 0904 Last data filed at 12/05/2019 0900 Gross per 24 hour  Intake 1567.47 ml  Output --  Net 1567.47 ml   Filed Weights   12/02/19 1740 12/03/19 0500 12/04/19 0500  Weight: 77.6 kg 78.8 kg 77.7 kg    Examination:  General Appearance:  Appears ill, with labored breathing on dialysis at this time.  Head:  Normocephalic, without obvious abnormality, atraumatic Eyes:  PERRL - yes, conjunctiva/corneas - injected Nose:  G tube - YES Throat:  TRACH +.  Neck:  Supple,  No enlargement/tenderness/nodules Lungs: + rhonchi, on ATC Heart:  S1 and S2 normal, no murmur Abdomen:  Soft, no masses, no organomegaly Genitalia / Rectal:  Not done Extremities:  Extremities- intact with blisters under dressing on feet and knee Skin:  intact in exposed areas . Sacral area - not examined Neurologic:  Awake appropriately conversant.         Resolved Hospital Problem list     Assessment & Plan:  # Cardiac arrest- doubt PE but contrast timing poor, venous duplex negative  Vfib rhythm,   Combined systolic and diastolic CHF, NSVT, prolonged QT Plan -on IV amio per EP note from 12/19 -Not a candidate for ICD per EP -May be LifeVest eventually -qt is prolonged on most recent checks, note on amio but avoiding use of other agents that could be prolonging.  -mag >2 and K >4 Afib:  -amio gtt ongoing -not on heparin gtt at this time due to decreasing hgb previously  -may restart soon if tolerates subcut heparin and no other issues arise.  -favor this over oral noac in setting of potential need for peg if unable to come off vent soon.     # Shock - cardiogenic -resolved Plan  - monitor  - MAP goal > 65  #Acute on chronic respiratory failure, difficult intubation needing cric- s/p  Trach - -Goal is to progress to trach collar,  Hopefully will make more progress after dialysis Plan ATC as tolerated, only short durations as of yet.  ATC after dialysis again -start po xanax for anxiety prn # subsegmental PE:  -possibly restart heparin gtt tomorrow.     #anxiety:  -prn xanax  # ESRD-intermittent HD per renal, last 12/16 and 12/18 Plan  - per renal - HD   # Hx polysustance abuse- cocaine, alcohol, tobacco Plan -Fentanyl as needed, lowered to 25 mcg  # Ileus- resolved  #Anemia normocytic:  Plan -no acute indication for transfusion -no overt bleeding noted  #Ischemic changes of feet -due to low flow, obtain vascular input, now off IV heparin. Seen by VVS 12/18 - VVS consult concerned that patient iat risk for amputation of lower extremity and blisters are signaling ischemia. Recommending supportive care and await demarcation. Patient not interested in amputation Plan  - wound care  - VVS consult  - await demarcation    Best practice:  Diet: TFs Pain/Anxiety/Delirium protocol (if indicated): see above VAP protocol (if indicated): yes, ordered DVT prophylaxis: heparin subcut GI prophylaxis: PPI Glucose control: yes less than 180 Foley: none Mobility:  bed rest Code Status: Full (hold off pall care) Disposition: ICU        LABS    PULMONARY Recent Labs  Lab 11/28/19 1152  PHART 7.356  PCO2ART 29.8*  PO2ART 148*  HCO3 16.3*  O2SAT 99.3    CBC Recent Labs  Lab 12/03/19 0418 12/04/19 0322 12/05/19 0215  HGB 7.4* 7.6* 8.1*  HCT 22.7* 23.1* 24.8*  WBC 13.8* 13.7* 14.7*  PLT 428* 457* 476*    COAGULATION No results for input(s): INR in the last 168 hours.  CARDIAC  No results for input(s): TROPONINI in the last 168 hours. No results for input(s): PROBNP in the last 168 hours.   CHEMISTRY Recent Labs  Lab 11/29/19 2019 11/30/19 2236 12/01/19 0356 12/02/19 0527 12/03/19 0418 12/04/19 0322 12/05/19 0215  NA 133* 135 136 134* 134* 132* 131*  K 3.6 3.3* 3.8 4.3 3.7 4.3 5.1  CL 97* 99 96* 97* 94* 92* 92*  CO2 18* 25 24 24 25 24 24   GLUCOSE 140* 106* 128* 115* 118* 113* 116*  BUN 81* 31* 46* 65* 39* 65* 85*  CREATININE 6.61* 2.74* 4.41* 5.94* 4.07* 5.37* 6.65*  CALCIUM 7.7* 7.5* 7.5* 7.5* 7.7* 7.4* 7.4*  MG 2.6* 2.0  --   --   --   --   --   PHOS  --   --  2.3* 2.3* 2.0* 2.5 2.8   Estimated Creatinine Clearance: 13.8 mL/min (A) (by C-G formula based on SCr of 6.65 mg/dL (H)).   LIVER Recent Labs  Lab 12/01/19 0356 12/02/19 0527 12/03/19 0418 12/04/19 0322 12/05/19 0215  AST  --   --  19 16  --   ALT  --   --  21 18  --   ALKPHOS  --   --  113 114  --   BILITOT  --   --  0.8 0.9  --   PROT  --   --  6.5 6.5  --   ALBUMIN 2.2* 2.1* 2.2*  2.2* 2.2*  2.1* 2.2*     INFECTIOUS Recent Labs  Lab 12/04/19 0322  LATICACIDVEN 1.5     ENDOCRINE CBG (last 3)  Recent Labs    12/04/19 2353 12/05/19 0339 12/05/19 0812  GLUCAP 103* 101* 124*         IMAGING x48h  - image(s) personally visualized  -   highlighted in bold DG CHEST PORT 1 VIEW  Result Date: 12/04/2019 CLINICAL DATA:  Tracheostomy EXAM: PORTABLE CHEST 1 VIEW COMPARISON:  Two days ago FINDINGS: Tracheostomy tube remains  well seated. The feeding tube reaches the stomach. Extensive bilateral airspace disease. Cardiopericardial enlargement and vascular pedicle widening. No visible pneumothorax. There is layering pleural effusion. IMPRESSION: Stable hardware positioning, extensive airspace disease, and pleural effusions. Electronically Signed   By: Monte Fantasia M.D.   On: 12/04/2019 08:57   DG Abd Portable 1V  Result Date: 12/05/2019 CLINICAL DATA:  Ileus EXAM: PORTABLE ABDOMEN - 1 VIEW COMPARISON:  November 29, 2019 FINDINGS: Feeding tube tip is in the third portion of the duodenum. There is no bowel dilatation or air-fluid level to suggest bowel obstruction. No free air. There is airspace opacity in the lung bases. IMPRESSION: No bowel obstruction or free air. Feeding tube tip in third portion of duodenum. Infiltrate in lung bases. Electronically Signed   By: Lowella Grip III M.D.   On: 12/05/2019 06:55   Critical care time: The patient is critically ill with multiple organ systems failure and requires high complexity decision making for assessment and support, frequent evaluation and titration of therapies, application of advanced monitoring technologies and extensive interpretation of multiple databases.  Critical care time 39 mins. This represents my time independent of the NPs time taking care of the pt. This is excluding procedures.    Audria Nine DO  Pulmonary and Critical Care 12/05/2019, 9:04 AM

## 2019-12-05 NOTE — Progress Notes (Signed)
Physical Therapy Treatment Patient Details Name: [name] [name] MRN: [id number] DOB: Jun 27, 1966 Today's Date: 12/05/2019    History of Present Illness 53yo male presented post PEA arrest which occurred during iHD treatment as outpatient. This was a witnessed arrest with quick bystander CPR. Emergent cricothyrotomy performed by EMS due to failed intubation with [facility] airway. PMH significant for polysubstance abuse, HTN, ESRD, CHF, nonischemic cardiomyopathy. Trach placed on 12/10.    PT Comments    Pt tolerated treatment well, transferring and performing LE exercise despite BLE pain and blisters. Pt demonstrates some improvement in LE power and transfer quality although continuing to require physical assistance for all mobility. Pt limited by vent requirements at this time, but without desaturation during this session. Pt will benefit from continued acute PT POC to improve LE strength and increase tolerance for OOB mobility.   Follow Up Recommendations  CIR;Supervision/Assistance - 24 hour     Equipment Recommendations  (defer to post-acute setting)    Recommendations for Other Services       Precautions / Restrictions Precautions Precautions: Fall Precaution Comments: Ventilated Restrictions Weight Bearing Restrictions: No    Mobility  Bed Mobility Overal bed mobility: (pt received sitting edge of bed and left in recliner)                Transfers Overall transfer level: Needs assistance Equipment used: 1 person hand held assist Transfers: Sit to/from [employer] Transfers Sit to Stand: Mod assist Stand pivot transfers: Mod assist          Ambulation/Gait Ambulation/Gait assistance: Mod assist Gait Distance (Feet): 2 Feet Assistive device: 1 person hand held assist Gait Pattern/deviations: Shuffle Gait velocity: reduced Gait velocity interpretation: <1.31 ft/sec, indicative of household ambulator General Gait Details: short shuffling steps to turn from  bed to recliner   Stairs             Wheelchair Mobility    Modified Rankin (Stroke Patients Only)       Balance Overall balance assessment: Needs assistance Sitting-balance support: Single extremity supported;Feet supported Sitting balance-Leahy Scale: Good Sitting balance - Comments: modI in recliner, supervision at edge of bed   Standing balance support: Bilateral upper extremity supported Standing balance-Leahy Scale: Poor Standing balance comment: modA during transfers                            Cognition Arousal/Alertness: Awake/alert Behavior During Therapy: WFL for tasks assessed/performed Overall Cognitive Status: Difficult to assess                                 General Comments: pt on vent via trach, engages appropriately       Exercises General Exercises - Lower Extremity Ankle Circles/Pumps: AROM;Both;20 reps Gluteal Sets: AROM;Both;20 reps Long Arc Quad: AROM;Both;20 reps Hip ABduction/ADduction: AROM;Both;20 reps Hip Flexion/Marching: AROM;Both;20 reps Other Exercises Other Exercises: press ups in recliner, 2 sets of 10 reps    General Comments General comments (skin integrity, edema, etc.): pt on vent via trach collar, full support, satting from 97-100% during session      Pertinent Vitals/Pain Pain Assessment: Faces Faces Pain Scale: Hurts little more Pain Location: BLE Pain Descriptors / Indicators: Aching Pain Intervention(s): Monitored during session;Limited activity within patient's tolerance    Home Living  Prior Function            PT Goals (current goals can now be found in the care plan section) Acute Rehab PT Goals Patient Stated Goal: to get stronger Progress towards PT goals: Not progressing toward goals - comment(limited by vent needs)    Frequency    Min 3X/week      PT Plan Current plan remains appropriate    Co-evaluation              AM-PAC  PT "6 Clicks" Mobility   Outcome Measure  Help needed turning from your back to your side while in a flat bed without using bedrails?: A Lot Help needed moving from lying on your back to sitting on the side of a flat bed without using bedrails?: A Lot Help needed moving to and from a bed to a chair (including a wheelchair)?: A Lot Help needed standing up from a chair using your arms (e.g., wheelchair or bedside chair)?: A Lot Help needed to walk in hospital room?: A Lot Help needed climbing 3-5 steps with a railing? : Total 6 Click Score: 11    End of Session Equipment Utilized During Treatment: Oxygen Activity Tolerance: Patient tolerated treatment well Patient left: in chair;with call bell/phone within reach;with nursing/sitter in room Nurse Communication: Mobility status PT Visit Diagnosis: Unsteadiness on feet (R26.81);Muscle weakness (generalized) (M62.81)     Time: 1610-9604 PT Time Calculation (min) (ACUTE ONLY): 23 min  Charges:  $Therapeutic Exercise: 8-22 mins $Therapeutic Activity: 8-22 mins                     Zenaida Niece, PT, DPT Acute Rehabilitation Pager: [phone]    Zenaida Niece 12/05/2019, 8:29 AM

## 2019-12-05 NOTE — Progress Notes (Signed)
Progress Note    12/05/2019 9:14 AM 11 Days Post-Op  Subjective:  Awake and alert on T piece.  Slightly labored breathing. On beside HD via LUE fistula without complications.  Attendant RN at bedside.  Dressings changed at 0600 with Dopplerable pulses. WOC notes reviewed.  Vitals:   12/05/19 0814 12/05/19 0900  BP:  (!) 150/76  Pulse:  98  Resp:  (!) 23  Temp: 98.1 F (36.7 C)   SpO2:  95%    Physical Exam: Cardiac:  RRR Lungs:  Slightly labored  Extremities:  Dressings in bil knees and ankles dry and intact.  Toes warm to touch  CBC    Component Value Date/Time   WBC 14.7 (H) 12/05/2019 0215   RBC 2.77 (L) 12/05/2019 0215   HGB 8.1 (L) 12/05/2019 0215   HGB 12.3 (L) 12/31/2017 1012   HCT 24.8 (L) 12/05/2019 0215   HCT 37.4 (L) 12/31/2017 1012   PLT 476 (H) 12/05/2019 0215   PLT 176 12/31/2017 1012   MCV 89.5 12/05/2019 0215   MCV 99 (H) 12/31/2017 1012   MCH 29.2 12/05/2019 0215   MCHC 32.7 12/05/2019 0215   RDW 18.4 (H) 12/05/2019 0215   RDW 18.8 (H) 12/31/2017 1012   LYMPHSABS 1.1 12/05/2019 0215   LYMPHSABS 1.0 12/31/2017 1012   MONOABS 1.6 (H) 12/05/2019 0215   EOSABS 0.3 12/05/2019 0215   EOSABS 0.1 12/31/2017 1012   BASOSABS 0.1 12/05/2019 0215   BASOSABS 0.0 12/31/2017 1012    BMET    Component Value Date/Time   NA 131 (L) 12/05/2019 0215   K 5.1 12/05/2019 0215   CL 92 (L) 12/05/2019 0215   CO2 24 12/05/2019 0215   GLUCOSE 116 (H) 12/05/2019 0215   BUN 85 (H) 12/05/2019 0215   CREATININE 6.65 (H) 12/05/2019 0215   CALCIUM 7.4 (L) 12/05/2019 0215   CALCIUM 8.2 (L) 10/21/2010 1420   GFRNONAA 9 (L) 12/05/2019 0215   GFRAA 10 (L) 12/05/2019 0215     Intake/Output Summary (Last 24 hours) at 12/05/2019 0914 Last data filed at 12/05/2019 0900 Gross per 24 hour  Intake 1567.47 ml  Output --  Net 1567.47 ml    HOSPITAL MEDICATIONS Scheduled Meds: . B-complex with vitamin C  1 tablet Per Tube Daily  . chlorhexidine gluconate (MEDLINE  KIT)  15 mL Mouth Rinse BID  . Chlorhexidine Gluconate Cloth  6 each Topical Daily  . Chlorhexidine Gluconate Cloth  6 each Topical Q0600  . Chlorhexidine Gluconate Cloth  6 each Topical Q0600  . Chlorhexidine Gluconate Cloth  6 each Topical Q0600  . [START ON 12/06/2019] darbepoetin (ARANESP) injection - DIALYSIS  100 mcg Intravenous Q Tue-HD  . docusate  100 mg Per Tube Daily  . feeding supplement (PRO-STAT SUGAR FREE 64)  30 mL Per Tube TID  . insulin aspart  0-9 Units Subcutaneous Q4H  . mouth rinse  15 mL Mouth Rinse 10 times per day  . midodrine  10 mg Per Tube Q8H  . pantoprazole sodium  40 mg Per Tube QHS  . sodium chloride flush  10-40 mL Intracatheter Q12H  . white petrolatum   Topical Daily   Continuous Infusions: . sodium chloride Stopped (12/01/19 1424)  . amiodarone 30 mg/hr (12/05/19 0900)  . feeding supplement (VITAL 1.5 CAL) 1,000 mL (12/04/19 1645)   PRN Meds:.sodium chloride, acetaminophen (TYLENOL) oral liquid 160 mg/5 mL, acetaminophen, fentaNYL (SUBLIMAZE) injection, oxyCODONE, prochlorperazine, sodium chloride flush  Assessment:  53 y.o. male is s/p: PEA  arrest/vasopressors with ischemic changes to bi lower extremities.  Plan: -Continue local care and monitoring for infection     , PA-C [facility] [phone] 12/05/2019  9:14 AM 

## 2019-12-05 NOTE — Progress Notes (Signed)
[facility] ROUNDING NOTE   Subjective:   This 53 year old male status post PEA arrest occurred during hemodialysis treatments outpatient 11/16/2019.[name] [name]  This was a witnessed arrest with bystander CPR.  Emergent cricothyroidotomy performed by EMS.  Due to failed intubation with [facility] airway.  Does have a past medical history that is pertinent for polysubstance hypertension stage renal disease ischemic cardiomyopathy-2D echo 11/17/2019 showed ejection fraction less than 20%.   CRRT was initiated 11/18/2019.  Status post intermittent hemodialysis 12/02/2019, this was tolerated 1 L removed.[name] [name]  He is scheduled for dialysis 12/05/2019. Hospital course has been complicated by lower extremity vascular ischemia secondary to cardiac arrest and need for pressors.  He was evaluated by Dr. Annamarie Major.  Appreciate the assistance.  Blood pressure 150/76 pulse 98 temperature 98.1 O2 sats 95% tracheostomy collar FiO2 40%  Sodium 131 potassium 5.1 chloride 92 CO2 24 BUN 85 creatinine 6.65 glucose 116 calcium 7.4 phosphorus 2.8 albumin 2.2   WBC 14.7 hemoglobin 8.1 platelets 476  IV amiodarone Insulin sliding scale Midodrine 10 mg every 8 hours   Objective:  Vital signs in last 24 hours:  Temp:  [98.1 F (36.7 C)-98.6 F (37 C)] 98.1 F (36.7 C) (12/21 0814) Pulse Rate:  [74-120] 100 (12/21 0805) Resp:  [15-26] 21 (12/21 0805) BP: (87-162)/(63-106) 162/104 (12/21 0805) SpO2:  [80 %-100 %] 97 % (12/21 0805) FiO2 (%):  [40 %] 40 % (12/21 0804)  Weight change:  Filed Weights   12/02/19 1740 12/03/19 0500 12/04/19 0500  Weight: 77.6 kg 78.8 kg 77.7 kg    Intake/Output: I/O last 3 completed shifts: In: 1896.5 [I.V.:656.5; NG/GT:1240] Out: 0    Intake/Output this shift:  Total I/O In: 66.7 [I.V.:16.7; NG/GT:50] Out: -   Gen: sitting up on edge of bed on vent via trach CVS: no rub Resp: scattered rhonchi Abd:+BS, soft, NT/Nd Ext: +ischemic changes to bilateral feet with edema,  LUE AVF +T/B  Basic Metabolic Panel: Recent Labs  Lab 11/29/19 2019 11/30/19 2236 12/01/19 0356 12/02/19 0527 12/03/19 0418 12/04/19 0322 12/05/19 0215  NA 133* 135 136 134* 134* 132* 131*  K 3.6 3.3* 3.8 4.3 3.7 4.3 5.1  CL 97* 99 96* 97* 94* 92* 92*  CO2 18* _0 GLUCOSE 140* 106* 128* 115* 118* 113* 116*  BUN 81* 31* 46* 65* 39* 65* 85*  CREATININE 6.61* 2.74* 4.41* 5.94* 4.07* 5.37* 6.65*  CALCIUM 7.7* 7.5* 7.5* 7.5* 7.7* 7.4* 7.4*  MG 2.6* 2.0  --   --   --   --   --   PHOS  --   --  2.3* 2.3* 2.0* 2.5 2.8    Liver Function Tests: Recent Labs  Lab 12/01/19 0356 12/02/19 0527 12/03/19 0418 12/04/19 0322 12/05/19 0215  AST  --   --  19 16  --   ALT  --   --  21 18  --   ALKPHOS  --   --  113 114  --   BILITOT  --   --  0.8 0.9  --   PROT  --   --  6.5 6.5  --   ALBUMIN 2.2* 2.1* 2.2*  2.2* 2.2*  2.1* 2.2*   No results for input(s): LIPASE, AMYLASE in the last 168 hours. No results for input(s): AMMONIA in the last 168 hours.  CBC: Recent Labs  Lab 12/01/19 0356 12/01/19 1500 12/02/19 0527 12/03/19 0418 12/04/19 0322 12/05/19 0215  WBC 13.1*  --  14.5* 13.8* 13.7* 14.7*  NEUTROABS 10.0*  --  11.5* 10.8* 10.7* 11.1*  HGB 6.9* 7.7* 7.5* 7.4* 7.6* 8.1*  HCT 19.4* 22.0* 22.5* 22.7* 23.1* 24.8*  MCV 82.2  --  87.9 90.8 89.5 89.5  PLT 421*  --  423* 428* 457* 476*    Cardiac Enzymes: No results for input(s): CKTOTAL, CKMB, CKMBINDEX, TROPONINI in the last 168 hours.  BNP: Invalid input(s): POCBNP  CBG: Recent Labs  Lab 12/04/19 1607 12/04/19 2003 12/04/19 2353 12/05/19 0339 12/05/19 0812  GLUCAP 112* 118* 103* 101* 75*    Microbiology: Results for orders placed or performed during the hospital encounter of 11/16/19  SARS Coronavirus 2 by RT PCR (hospital order, performed in [facility] hospital lab) Nasopharyngeal Nasopharyngeal Swab     Status: None   Collection Time: 11/16/19 11:41 AM   Specimen: Nasopharyngeal Swab   Result Value Ref Range Status   SARS Coronavirus 2 NEGATIVE NEGATIVE Final    Comment: (NOTE) SARS-CoV-2 target nucleic acids are NOT DETECTED. The SARS-CoV-2 RNA is generally detectable in upper and lower respiratory specimens during the acute phase of infection. The lowest concentration of SARS-CoV-2 viral copies this assay can detect is 250 copies / mL. A negative result does not preclude SARS-CoV-2 infection and should not be used as the sole basis for treatment or other patient management decisions.  A negative result may occur with improper specimen collection / handling, submission of specimen other than nasopharyngeal swab, presence of viral mutation(s) within the areas targeted by this assay, and inadequate number of viral copies (<250 copies / mL). A negative result must be combined with clinical observations, patient history, and epidemiological information. Fact Sheet for Patients:   StrictlyIdeas.no Fact Sheet for Healthcare Providers: BankingDealers.co.za This test is not yet approved or cleared  by the [place] FDA and has been authorized for detection and/or diagnosis of SARS-CoV-2 by FDA under an Emergency Use Authorization (EUA).  This EUA will remain in effect (meaning this test can be used) for the duration of the COVID-19 declaration under Section 564(b)(1) of the Act, 21 U.S.C. section 360bbb-3(b)(1), unless the authorization is terminated or revoked sooner. Performed at [facility], [place] [address], [place], [place] [address]   Culture, blood (routine x 2)     Status: None   Collection Time: 11/16/19  6:03 PM   Specimen: BLOOD RIGHT HAND  Result Value Ref Range Status   Specimen Description BLOOD RIGHT HAND  Final   Special Requests   Final    BOTTLES DRAWN AEROBIC AND ANAEROBIC Blood Culture results may not be optimal due to an inadequate volume of blood received in culture bottles   Culture   Final     NO GROWTH 5 DAYS Performed at [facility], [place] [address] [place], [place] [address]    Report Status 11/21/2019 FINAL  Final  MRSA PCR Screening     Status: None   Collection Time: 11/16/19  6:05 PM   Specimen: Nasal Mucosa; Nasopharyngeal  Result Value Ref Range Status   MRSA by PCR NEGATIVE NEGATIVE Final    Comment:        The GeneXpert MRSA Assay (FDA approved for NASAL specimens only), is one component of a comprehensive MRSA colonization surveillance program. It is not intended to diagnose MRSA infection nor to guide or monitor treatment for MRSA infections. Performed at [facility], [place] [address], [place], [place] [address]   Culture, blood (routine x 2)  Status: None   Collection Time: 11/16/19  6:08 PM   Specimen: BLOOD RIGHT HAND  Result Value Ref Range Status   Specimen Description BLOOD RIGHT HAND  Final   Special Requests   Final    BOTTLES DRAWN AEROBIC ONLY Blood Culture results may not be optimal due to an inadequate volume of blood received in culture bottles   Culture   Final    NO GROWTH 5 DAYS Performed at [facility], [place] [address], [place], [place] [address]    Report Status 11/21/2019 FINAL  Final  Culture, respiratory (non-expectorated)     Status: None   Collection Time: 11/18/19 12:28 PM   Specimen: Tracheal Aspirate; Respiratory  Result Value Ref Range Status   Specimen Description TRACHEAL ASPIRATE  Final   Special Requests NONE  Final   Gram Stain   Final    ABUNDANT WBC PRESENT,BOTH PMN AND MONONUCLEAR ABUNDANT GRAM POSITIVE COCCI ABUNDANT GRAM NEGATIVE RODS FEW GRAM VARIABLE ROD    Culture   Final    ABUNDANT HAEMOPHILUS INFLUENZAE BETA LACTAMASE POSITIVE Performed at [facility], [place] [address] [place], [place] [address]    Report Status 11/20/2019 FINAL  Final  Surgical pcr screen     Status: Abnormal   Collection Time: 11/24/19  7:53 AM   Specimen: Nasal Mucosa; Nasal Swab  Result Value Ref  Range Status   MRSA, PCR NEGATIVE NEGATIVE Final   Staphylococcus aureus POSITIVE (A) NEGATIVE Final    Comment: (NOTE) The Xpert SA Assay (FDA approved for NASAL specimens in patients 74 years of age and older), is one component of a comprehensive surveillance program. It is not intended to diagnose infection nor to guide or monitor treatment. Performed at [facility], [place] [address], [place], [place] [address]   Culture, blood (routine x 2)     Status: None   Collection Time: 11/25/19  3:30 PM   Specimen: BLOOD  Result Value Ref Range Status   Specimen Description BLOOD RIGHT ANTECUBITAL  Final   Special Requests   Final    BOTTLES DRAWN AEROBIC ONLY Blood Culture results may not be optimal due to an inadequate volume of blood received in culture bottles   Culture   Final    NO GROWTH 5 DAYS Performed at [facility], [place] [address], [place], [place] [address]    Report Status 11/30/2019 FINAL  Final  Culture, blood (routine x 2)     Status: None   Collection Time: 11/25/19  3:30 PM   Specimen: BLOOD RIGHT HAND  Result Value Ref Range Status   Specimen Description BLOOD RIGHT HAND  Final   Special Requests   Final    BOTTLES DRAWN AEROBIC AND ANAEROBIC Blood Culture results may not be optimal due to an inadequate volume of blood received in culture bottles   Culture   Final    NO GROWTH 5 DAYS Performed at [facility], [place] [address] [place], [place] [address]    Report Status 11/30/2019 FINAL  Final  Culture, respiratory (non-expectorated)     Status: None   Collection Time: 11/25/19  4:35 PM   Specimen: Tracheal Aspirate; Respiratory  Result Value Ref Range Status   Specimen Description TRACHEAL ASPIRATE  Final   Special Requests NONE  Final   Gram Stain   Final    ABUNDANT WBC PRESENT,BOTH PMN AND MONONUCLEAR FEW GRAM POSITIVE COCCI Performed at [facility], [place]  [address] [place], [place] [address]    Culture FEW STAPHYLOCOCCUS  EPIDERMIDIS  Final   Report Status 11/28/2019 FINAL  Final   Organism ID, Bacteria STAPHYLOCOCCUS EPIDERMIDIS  Final      Susceptibility   Staphylococcus epidermidis - MIC*    CIPROFLOXACIN <=0.5 SENSITIVE Sensitive     ERYTHROMYCIN >=8 RESISTANT Resistant     GENTAMICIN <=0.5 SENSITIVE Sensitive     OXACILLIN >=4 RESISTANT Resistant     TETRACYCLINE <=1 SENSITIVE Sensitive     VANCOMYCIN 2 SENSITIVE Sensitive     TRIMETH/SULFA <=10 SENSITIVE Sensitive     CLINDAMYCIN >=8 RESISTANT Resistant     RIFAMPIN <=0.5 SENSITIVE Sensitive     Inducible Clindamycin NEGATIVE Sensitive     * FEW STAPHYLOCOCCUS EPIDERMIDIS    Coagulation Studies: No results for input(s): LABPROT, INR in the last 72 hours.  Urinalysis: No results for input(s): COLORURINE, LABSPEC, PHURINE, GLUCOSEU, HGBUR, BILIRUBINUR, KETONESUR, PROTEINUR, UROBILINOGEN, NITRITE, LEUKOCYTESUR in the last 72 hours.  Invalid input(s): APPERANCEUR    Imaging: DG CHEST PORT 1 VIEW  Result Date: 12/04/2019 CLINICAL DATA:  Tracheostomy EXAM: PORTABLE CHEST 1 VIEW COMPARISON:  Two days ago FINDINGS: Tracheostomy tube remains well seated. The feeding tube reaches the stomach. Extensive bilateral airspace disease. Cardiopericardial enlargement and vascular pedicle widening. No visible pneumothorax. There is layering pleural effusion. IMPRESSION: Stable hardware positioning, extensive airspace disease, and pleural effusions. Electronically Signed   By: Monte Fantasia M.D.   On: 12/04/2019 08:57   DG Abd Portable 1V  Result Date: 12/05/2019 CLINICAL DATA:  Ileus EXAM: PORTABLE ABDOMEN - 1 VIEW COMPARISON:  November 29, 2019 FINDINGS: Feeding tube tip is in the third portion of the duodenum. There is no bowel dilatation or air-fluid level to suggest bowel obstruction. No free air. There is airspace opacity in the lung bases. IMPRESSION: No bowel obstruction or free air. Feeding tube tip in third portion of duodenum. Infiltrate in lung  bases. Electronically Signed   By: Lowella Grip III M.D.   On: 12/05/2019 06:55     Medications:   . sodium chloride Stopped (12/01/19 1424)  . amiodarone 30 mg/hr (12/05/19 0800)  . feeding supplement (VITAL 1.5 CAL) 1,000 mL (12/04/19 1645)   . B-complex with vitamin C  1 tablet Per Tube Daily  . chlorhexidine gluconate (MEDLINE KIT)  15 mL Mouth Rinse BID  . Chlorhexidine Gluconate Cloth  6 each Topical Daily  . Chlorhexidine Gluconate Cloth  6 each Topical Q0600  . Chlorhexidine Gluconate Cloth  6 each Topical Q0600  . Chlorhexidine Gluconate Cloth  6 each Topical Q0600  . docusate  100 mg Per Tube Daily  . feeding supplement (PRO-STAT SUGAR FREE 64)  30 mL Per Tube TID  . insulin aspart  0-9 Units Subcutaneous Q4H  . mouth rinse  15 mL Mouth Rinse 10 times per day  . midodrine  10 mg Per Tube Q8H  . pantoprazole sodium  40 mg Per Tube QHS  . sodium chloride flush  10-40 mL Intracatheter Q12H  . white petrolatum   Topical Daily   sodium chloride, acetaminophen (TYLENOL) oral liquid 160 mg/5 mL, acetaminophen, fentaNYL (SUBLIMAZE) injection, oxyCODONE, prochlorperazine, sodium chloride flush  Assessment/ Plan:  1. ESRD-s/p HD on 12/02/19 and had drop in BP to 62/46.BP remains soft and may need pressor support vs continued CVVHDif he cannot tolerate IHD.  We will see if he tolerates dialysis 12/05/2019 2. S/p PEA arrest 12/2 and VT arrest 12/8- cardiology IV amiodarone 3. ICHF-  not candidate for ICD due to noncompliance and drug abuse. 4. Cardiogenic shock- off pressors   5. Anemia of CKD- s/p blood transfusion 12/01/19. Last dose of darbepoetin was 11/29/2019 will administer next dose 12/06/2019 received 100 mcg 6. VDRF- s/p emergent cricothyroidotomy. Per PCCM 7. H/o polysubstance abuse 8. SHPTH- hold binders due to low phos and npo.    LOS: [place] _0 _1 :00 AM

## 2019-12-05 NOTE — Consult Note (Addendum)
Palliative care consult note  Reason for consult: Goals of care in light of PEA arrest in conjunction with end-stage renal disease  Palliative consult received.  Chart reviewed including personal review of pertinent labs and imaging.  I saw and examined [name] today.  He is awake and alert with tracheostomy on ventilator support and is able to mouth words and nod head to communicate.  Briefly, he is a 53 year old male with past medical history of end-stage renal disease that occurred during hemodialysis treatment as an outpatient.  Witnessed arrest with quick bystander CPR and emergent cricothyrotomy performed by EMS due to failed intubation in the field.  Since admission to the hospital, he had another episode of V. tach arrest on 12/8, had tracheostomy placed 12/10, and has been working to reestablish on hemodialysis but this is been limited secondary to low pressure.  Additionally, he has had ischemia and blistering of lower extremity and plan is to observe for signs of infection and demarcation with consideration he may require amputation in the future.  I introduced palliative care as specialized medical care for people living with serious illness. It focuses on providing relief from the symptoms and stress of a serious illness. The goal is to improve quality of life for both the patient and the family.  He indicates that he is tired this evening, but does participate in conversation with a very short 1-2 word answers.   We reviewed his clinical course including concerns for his ability to tolerate dialysis.  He expressed understanding but did not elaborate further.    Limited discussion today as Mr. Dearinger was worn out.  Will plan for continued follow-up and conversations as his clinical course progresses.  - Full Code/Full Scope treatment - Initial conversations today regarding long term goals of care.  Will continue to follow and progress conversation based upon his clinical course  this admission.  At this point, he remains invested in plan for continuation of all offered interventions.  Total time: 45 minutes Greater than 50%  of this time was spent counseling and coordinating care related to the above assessment and plan.  Micheline Rough, MD [place] Team [phone]

## 2019-12-06 ENCOUNTER — Inpatient Hospital Stay (HOSPITAL_COMMUNITY): Payer: Medicare Other

## 2019-12-06 DIAGNOSIS — M7989 Other specified soft tissue disorders: Secondary | ICD-10-CM

## 2019-12-06 LAB — RENAL FUNCTION PANEL
Albumin: 2.1 g/dL — ABNORMAL LOW (ref 3.5–5.0)
Anion gap: 16 — ABNORMAL HIGH (ref 5–15)
BUN: 47 mg/dL — ABNORMAL HIGH (ref 6–20)
CO2: 25 mmol/L (ref 22–32)
Calcium: 7.8 mg/dL — ABNORMAL LOW (ref 8.9–10.3)
Chloride: 93 mmol/L — ABNORMAL LOW (ref 98–111)
Creatinine, Ser: 4.52 mg/dL — ABNORMAL HIGH (ref 0.61–1.24)
GFR calc Af Amer: 16 mL/min — ABNORMAL LOW (ref >60)
GFR calc non Af Amer: 14 mL/min — ABNORMAL LOW (ref >60)
Glucose, Bld: 112 mg/dL — ABNORMAL HIGH (ref 70–99)
Phosphorus: 2.3 mg/dL — ABNORMAL LOW (ref 2.5–4.6)
Potassium: 4.5 mmol/L (ref 3.5–5.1)
Sodium: 134 mmol/L — ABNORMAL LOW (ref 135–145)

## 2019-12-06 LAB — CBC WITH DIFFERENTIAL/PLATELET
Abs Immature Granulocytes: 0.22 10*3/uL — ABNORMAL HIGH (ref 0.00–0.07)
Basophils Absolute: 0 10*3/uL (ref 0.0–0.1)
Basophils Relative: 0 %
Eosinophils Absolute: 0.2 10*3/uL (ref 0.0–0.5)
Eosinophils Relative: 2 %
HCT: 22.8 % — ABNORMAL LOW (ref 39.0–52.0)
Hemoglobin: 7.2 g/dL — ABNORMAL LOW (ref 13.0–17.0)
Immature Granulocytes: 2 %
Lymphocytes Relative: 8 %
Lymphs Abs: 0.9 10*3/uL (ref 0.7–4.0)
MCH: 28.8 pg (ref 26.0–34.0)
MCHC: 31.6 g/dL (ref 30.0–36.0)
MCV: 91.2 fL (ref 80.0–100.0)
Monocytes Absolute: 1.3 10*3/uL — ABNORMAL HIGH (ref 0.1–1.0)
Monocytes Relative: 12 %
Neutro Abs: 8.3 10*3/uL — ABNORMAL HIGH (ref 1.7–7.7)
Neutrophils Relative %: 76 %
Platelets: 417 10*3/uL — ABNORMAL HIGH (ref 150–400)
RBC: 2.5 MIL/uL — ABNORMAL LOW (ref 4.22–5.81)
RDW: 18.2 % — ABNORMAL HIGH (ref 11.5–15.5)
WBC: 10.9 10*3/uL — ABNORMAL HIGH (ref 4.0–10.5)
nRBC: 0.5 % — ABNORMAL HIGH (ref 0.0–0.2)

## 2019-12-06 LAB — GLUCOSE, CAPILLARY
Glucose-Capillary: 104 mg/dL — ABNORMAL HIGH (ref 70–99)
Glucose-Capillary: 113 mg/dL — ABNORMAL HIGH (ref 70–99)
Glucose-Capillary: 85 mg/dL (ref 70–99)
Glucose-Capillary: 101 mg/dL — ABNORMAL HIGH (ref 70–99)
Glucose-Capillary: 101 mg/dL — ABNORMAL HIGH (ref 70–99)
Glucose-Capillary: 82 mg/dL (ref 70–99)

## 2019-12-06 LAB — MAGNESIUM: Magnesium: 1.9 mg/dL (ref 1.7–2.4)

## 2019-12-06 MED ORDER — CHLORHEXIDINE GLUCONATE CLOTH 2 % EX PADS
6.0000 | MEDICATED_PAD | Freq: Every day | CUTANEOUS | Status: DC
  Administered 2019-12-06 – 2019-12-14 (×7): 6 via TOPICAL

## 2019-12-06 MED ORDER — AMIODARONE HCL 200 MG PO TABS
400.0000 mg | ORAL_TABLET | Freq: Two times a day (BID) | ORAL | Status: DC
  Administered 2019-12-06 – 2019-12-11 (×12): 400 mg via ORAL
  Filled 2019-12-06 (×12): qty 2

## 2019-12-06 NOTE — Progress Notes (Signed)
Somner, MD called and notified about patient complaining of pain at IV site. Site is edematous. IV team consulted and patient is not able to receive a new PIV in that arm. Opposite arm is restricted.  Somner, MD is to let ground team know that patient is in need of IV access.

## 2019-12-06 NOTE — Progress Notes (Addendum)
Telemetry check AFib/flutter, 80's generally, occ PVCs, infrequent NSVT 3-5 beats, once 13 beats  Transition back to PO amiodarone today Not a device/EP procedure candidate at this time  EP will remain available/follow from afar Please recall if needed  Tommye Standard, PA-C  Mikle Bosworth.D.

## 2019-12-06 NOTE — Progress Notes (Signed)
NAME:  [name] [name], MRN:  [id number], DOB:  1966/10/18, LOS: 66 ADMISSION DATE:  11/16/2019, CONSULTATION DATE:  12/2 REFERRING MD:  Dr. Sedonia Small, CHIEF COMPLAINT:  Post cardiac arrest    Brief History   53yo male presented post PEA arrest which occurred during iHD treatment as outpatient. This was a witnessed arrest with quick bystander CPR. Received 3 rounds of epi, 1g of calcium and 50 mEq of bicarb with ROSC after 15 minutes.   Emergent cricothyrotomy performed by EMS due to failed intubation with [facility] airway.   Past Medical History  NSVT 2017 Nonischemic cardiomyopathy Medical noncompliance Polysubstance abuse Hypertension End-stage renal disease Combined systolic and diastolic congestive heart failure Cocaine abuse Alcohol abuse Prolonged QTC  Significant Hospital Events   12/2 Admitted post cardiac arrest, emergent cric, 33 C TTM 12/8 V. tach arrest 12/10 Cric changed to tracheostomy 12/16 HD, tolerated only 30 minutes of trach collar  12/18 - He is critically ill, remains on vent. Soft blood pressure overnight but off pressors Afebrile. Rx HD  12/19 -  MAP > 65 with slow SBP. Doing PSV via trach. EP wants amio gtt cntinued through 12/21 and then decide on Dc. RN concerned about bilsters on feet and knees. She has called wound care. Not on sedation gtt. Following commands/. No active bleeding. Diarrhea + per RN   Consults:  Nephrology Electrophysiology ENT [place] 12/18   Procedures:  12/10-trach 12/4 RT fem HD cath >> out 12/2 RT [place] CVL >>  Significant Diagnostic Tests:  CTA 12/2-segmental filling defects in the right middle lobe and right lower lobe pulmonary arteries, pneumomediastinum, subcu emphysema CT head 12/8-no acute abnormality  Echocardiogram 11/17/19-LVEF less than 20%, normal LV systolic function    Micro Data:  COVID 12/2 > negative Aspirate 12/04+ for Haemophilus influenza  12/11 Bc >> ng  Antimicrobials:  Unasyn 12/4>>12/7 Ceftriazone  12/7>>12/13  Interim history/subjective:    12/06/2019 -called by RN for increased anxiety when attempted to come off vent on ATC. Pt with increased wob on atc, getting dialysis at this time.   12/20: VVS recommends continued monitoring of LE and allowed necrosis to demarcate. Patient reportedly opposed to idea of amputaiton. Failed ATC and back on full vent. Amio gtt cotinues.  Afebrile.  Renam planning repeat HD v CRRT with pressor support 12/07/19 based on BP. Denies complaints other than LE pain. On TF  12/06/2019 complains of any pain with manipulation of lower extremities.  Currently on full mechanical ventilatory support.  No acute distress Objective   Blood pressure 98/61, pulse 100, temperature 99.1 F (37.3 C), temperature source Oral, resp. rate 20, height 5' 10.5" (1.791 m), weight 78.7 kg, SpO2 95 %.    Vent Mode: PRVC FiO2 (%):  [40 %] 40 % Set Rate:  [20 bmp] 20 bmp Vt Set:  [590 mL] 590 mL PEEP:  [5 cmH20] 5 cmH20 Plateau Pressure:  [20 cmH20-23 cmH20] 21 cmH20   Intake/Output Summary (Last 24 hours) at 12/06/2019 0953 Last data filed at 12/06/2019 0700 Gross per 24 hour  Intake 1516.78 ml  Output 1586 ml  Net -69.22 ml   Filed Weights   12/05/19 0900 12/05/19 1239 12/06/19 0500  Weight: 81.6 kg 80.2 kg 78.7 kg      Recent Labs    12/05/19 0215 12/06/19 0218  HGB 8.1* 7.2*   Examination:  General: Ill-appearing male no acute distress at rest HEENT: Currently on full mechanical ventilatory support via tracheostomy inner cannula noted to be partially way  out replaced Neuro: Grossly intact CV: Irregular heart sounds PULM: Diminished in the bases Vent pressure regulated volume control  FIO2 40% PEEP 5 RATE 20 with 0 overbreathing VT  GI: soft, bsx4 active  GU: Extremities: warm/dry,  edema  Skin: no rashes or lesions       Resolved Hospital Problem list     Assessment & Plan:  # Cardiac arrest- doubt PE but contrast timing poor, venous  duplex negative  Vfib rhythm,  Combined systolic and diastolic CHF, NSVT, prolonged QT Plan Transition to p.o. amiodarone Not a candidate for internal cardiac defibrillator per EP Monitor QTc interval   Afib:  Now on amiodarone p.o. Not on heparin due to low hemoglobin Consider subcu heparin    # Shock - cardiogenic -resolved Plan Monitor  #Acute on chronic respiratory failure, difficult intubation needing cric- s/p  Trach - -Goal is to progress to trach collar,  Hopefully will make more progress after dialysis Plan Trach collar as tolerated  # subsegmental PE:  Questionably restart heparin note hemoglobin 7.2    #anxiety:  As needed Xanax  # ESRD-intermittent HD per renal, last 12/16 and 12/18 Plan Per renal services   # Hx polysustance abuse- cocaine, alcohol, tobacco Plan As needed fentanyl  # Ileus-resolved as documented by K[facility]  #Anemia normocytic:  Recent Labs    12/05/19 0215 12/06/19 0218  HGB 8.1* 7.2*    Plan Monitor and transfuse per protocol  #Ischemic changes of feet -due to low flow, obtain vascular input, now off IV heparin. Seen by VVS 12/18 - VVS consult concerned that patient iat risk for amputation of lower extremity and blisters are signaling ischemia. Recommending supportive care and await demarcation. Patient not interested in amputation Plan  Wound care Vascular vein surgery is following    Best practice:  Diet: TFs Pain/Anxiety/Delirium protocol (if indicated): see above VAP protocol (if indicated): yes, ordered DVT prophylaxis: heparin subcut GI prophylaxis: PPI Glucose control: yes less than 180 Foley: none Mobility: bed rest Code Status: Full  Disposition: ICU considering transfer to progressive care unit with vent capabilities and/or LTAC        LABS    PULMONARY No results for input(s): PHART, PCO2ART, PO2ART, HCO3, TCO2, O2SAT in the last 168 hours.  Invalid input(s): PCO2, PO2  CBC Recent Labs    Lab 12/04/19 0322 12/05/19 0215 12/06/19 0218  HGB 7.6* 8.1* 7.2*  HCT 23.1* 24.8* 22.8*  WBC 13.7* 14.7* 10.9*  PLT 457* 476* 417*    COAGULATION No results for input(s): INR in the last 168 hours.  CARDIAC  No results for input(s): TROPONINI in the last 168 hours. No results for input(s): PROBNP in the last 168 hours.   CHEMISTRY Recent Labs  Lab 11/29/19 2019 11/30/19 2236 12/02/19 0527 12/03/19 0418 12/04/19 0322 12/05/19 0215 12/06/19 0218  NA 133* 135 134* 134* 132* 131* 134*  K 3.6 3.3* 4.3 3.7 4.3 5.1 4.5  CL 97* 99 97* 94* 92* 92* 93*  CO2 18* 25 24 25 24 24 25   GLUCOSE 140* 106* 115* 118* 113* 116* 112*  BUN 81* 31* 65* 39* 65* 85* 47*  CREATININE 6.61* 2.74* 5.94* 4.07* 5.37* 6.65* 4.52*  CALCIUM 7.7* 7.5* 7.5* 7.7* 7.4* 7.4* 7.8*  MG 2.6* 2.0  --   --   --   --  1.9  PHOS  --   --  2.3* 2.0* 2.5 2.8 2.3*   Estimated Creatinine Clearance: 20.3 mL/min (A) (by C-G formula based on  SCr of 4.52 mg/dL (H)).   LIVER Recent Labs  Lab 12/02/19 0527 12/03/19 0418 12/04/19 0322 12/05/19 0215 12/06/19 0218  AST  --  19 16  --   --   ALT  --  21 18  --   --   ALKPHOS  --  113 114  --   --   BILITOT  --  0.8 0.9  --   --   PROT  --  6.5 6.5  --   --   ALBUMIN 2.1* 2.2*  2.2* 2.2*  2.1* 2.2* 2.1*     INFECTIOUS Recent Labs  Lab 12/04/19 0322  LATICACIDVEN 1.5     ENDOCRINE CBG (last 3)  Recent Labs    12/05/19 2348 12/06/19 0345 12/06/19 0829  GLUCAP 104* 104* 113*         IMAGING x48h  - image(s) personally visualized  -   highlighted in bold DG Abd Portable 1V  Result Date: 12/05/2019 CLINICAL DATA:  Ileus EXAM: PORTABLE ABDOMEN - 1 VIEW COMPARISON:  November 29, 2019 FINDINGS: Feeding tube tip is in the third portion of the duodenum. There is no bowel dilatation or air-fluid level to suggest bowel obstruction. No free air. There is airspace opacity in the lung bases. IMPRESSION: No bowel obstruction or free air. Feeding tube  tip in third portion of duodenum. Infiltrate in lung bases. Electronically Signed   By: Lowella Grip III M.D.   On: 12/05/2019 06:55   Richardson Landry [place] Lasseter ACNP Acute Care Nurse Practitioner [place] Please consult Amion 12/06/2019, 9:54 AM

## 2019-12-06 NOTE — Progress Notes (Signed)
Upper extremity venous has been completed.   Preliminary results in CV Proc.   Abram Sander 12/06/2019 2:52 PM

## 2019-12-06 NOTE — Progress Notes (Signed)
[facility] ROUNDING NOTE   Subjective:   This 53 year old male status post PEA arrest occurred during hemodialysis treatments outpatient 11/16/2019.[name] [name]  This was a witnessed arrest with bystander CPR.  Emergent cricothyroidotomy performed by EMS.  Due to failed intubation with [facility] airway.  Does have a past medical history that is pertinent for polysubstance hypertension stage renal disease ischemic cardiomyopathy-2D echo 11/17/2019 showed ejection fraction less than 20%.   CRRT was initiated 11/18/2019.  Status post intermittent hemodialysis 12/02/2019, this was tolerated 1 L removed.[name] [name]  He is scheduled for dialysis 12/05/2019. Hospital course has been complicated by lower extremity vascular ischemia secondary to cardiac arrest and need for pressors.  He was evaluated by Dr. Annamarie Major.  Appreciate the assistance.  Blood pressure 130/76 pulse 91 temperature 98.1 O2 sats 98% 40% FiO2  Sodium 134 potassium 4.5 chloride 93 CO2 25 BUN 47 creatinine 4.52 glucose 112 calcium 7.8 phosphorus 2.3 magnesium 1.9 albumin 2.1 WBC 10.9 hemoglobin 7.2 platelets 417  IV amiodarone Insulin sliding scale Midodrine 10 mg every 8 hours   Objective:  Vital signs in last 24 hours:  Temp:  [98.1 F (36.7 C)-99.4 F (37.4 C)] 99.1 F (37.3 C) (12/22 0831) Pulse Rate:  [77-110] 100 (12/22 0500) Resp:  [18-27] 20 (12/22 0700) BP: (95-140)/(52-101) 98/61 (12/22 0700) SpO2:  [89 %-100 %] 95 % (12/22 0856) FiO2 (%):  [40 %] 40 % (12/22 0856) Weight:  [78.7 kg-80.2 kg] 78.7 kg (12/22 0500)  Weight change:  Filed Weights   12/05/19 0900 12/05/19 1239 12/06/19 0500  Weight: 81.6 kg 80.2 kg 78.7 kg    Intake/Output: I/O last 3 completed shifts: In: 2457.8 [I.V.:547.8; NG/GT:1910] Out: 8676 [Other:1486; Stool:100]   Intake/Output this shift:  No intake/output data recorded.  Gen: sitting up on edge of bed on vent via trach CVS: no rub Resp: scattered rhonchi Abd:+BS, soft, NT/Nd Ext:  +ischemic changes to bilateral feet with edema, LUE AVF +T/B  Basic Metabolic Panel: Recent Labs  Lab 11/29/19 2019 11/30/19 2236 12/02/19 0527 12/03/19 0418 12/04/19 0322 12/05/19 0215 12/06/19 0218  NA 133* 135 134* 134* 132* 131* 134*  K 3.6 3.3* 4.3 3.7 4.3 5.1 4.5  CL 97* 99 97* 94* 92* 92* 93*  CO2 18* 25 24 25 24 24 25   GLUCOSE 140* 106* 115* 118* 113* 116* 112*  BUN 81* 31* 65* 39* 65* 85* 47*  CREATININE 6.61* 2.74* 5.94* 4.07* 5.37* 6.65* 4.52*  CALCIUM 7.7* 7.5* 7.5* 7.7* 7.4* 7.4* 7.8*  MG 2.6* 2.0  --   --   --   --  1.9  PHOS  --   --  2.3* 2.0* 2.5 2.8 2.3*    Liver Function Tests: Recent Labs  Lab 12/02/19 0527 12/03/19 0418 12/04/19 0322 12/05/19 0215 12/06/19 0218  AST  --  19 16  --   --   ALT  --  21 18  --   --   ALKPHOS  --  113 114  --   --   BILITOT  --  0.8 0.9  --   --   PROT  --  6.5 6.5  --   --   ALBUMIN 2.1* 2.2*  2.2* 2.2*  2.1* 2.2* 2.1*   No results for input(s): LIPASE, AMYLASE in the last 168 hours. No results for input(s): AMMONIA in the last 168 hours.  CBC: Recent Labs  Lab 12/02/19 0527 12/03/19 0418 12/04/19 0322 12/05/19 0215 12/06/19 0218  WBC 14.5* 13.8* 13.7* 14.7* 10.9*  NEUTROABS 11.5* 10.8* 10.7* 11.1* 8.3*  HGB 7.5* 7.4* 7.6* 8.1* 7.2*  HCT 22.5* 22.7* 23.1* 24.8* 22.8*  MCV 87.9 90.8 89.5 89.5 91.2  PLT 423* 428* 457* 476* 417*    Cardiac Enzymes: No results for input(s): CKTOTAL, CKMB, CKMBINDEX, TROPONINI in the last 168 hours.  BNP: Invalid input(s): POCBNP  CBG: Recent Labs  Lab 12/05/19 1708 12/05/19 1953 12/05/19 2348 12/06/19 0345 12/06/19 0829  GLUCAP 108* 114* 104* 104* 113*    Microbiology: Results for orders placed or performed during the hospital encounter of 11/16/19  SARS Coronavirus 2 by RT PCR (hospital order, performed in [facility] hospital lab) Nasopharyngeal Nasopharyngeal Swab     Status: None   Collection Time: 11/16/19 11:41 AM   Specimen: Nasopharyngeal Swab   Result Value Ref Range Status   SARS Coronavirus 2 NEGATIVE NEGATIVE Final    Comment: (NOTE) SARS-CoV-2 target nucleic acids are NOT DETECTED. The SARS-CoV-2 RNA is generally detectable in upper and lower respiratory specimens during the acute phase of infection. The lowest concentration of SARS-CoV-2 viral copies this assay can detect is 250 copies / mL. A negative result does not preclude SARS-CoV-2 infection and should not be used as the sole basis for treatment or other patient management decisions.  A negative result may occur with improper specimen collection / handling, submission of specimen other than nasopharyngeal swab, presence of viral mutation(s) within the areas targeted by this assay, and inadequate number of viral copies (<250 copies / mL). A negative result must be combined with clinical observations, patient history, and epidemiological information. Fact Sheet for Patients:   StrictlyIdeas.no Fact Sheet for Healthcare Providers: BankingDealers.co.za This test is not yet approved or cleared  by the [place] FDA and has been authorized for detection and/or diagnosis of SARS-CoV-2 by FDA under an Emergency Use Authorization (EUA).  This EUA will remain in effect (meaning this test can be used) for the duration of the COVID-19 declaration under Section 564(b)(1) of the Act, 21 U.S.C. section 360bbb-3(b)(1), unless the authorization is terminated or revoked sooner. Performed at [facility], [place] [address], [place], [place] [address]   Culture, blood (routine x 2)     Status: None   Collection Time: 11/16/19  6:03 PM   Specimen: BLOOD RIGHT HAND  Result Value Ref Range Status   Specimen Description BLOOD RIGHT HAND  Final   Special Requests   Final    BOTTLES DRAWN AEROBIC AND ANAEROBIC Blood Culture results may not be optimal due to an inadequate volume of blood received in culture bottles   Culture   Final     NO GROWTH 5 DAYS Performed at [facility], [place] [address], [place], [place] [address]    Report Status 11/21/2019 FINAL  Final  MRSA PCR Screening     Status: None   Collection Time: 11/16/19  6:05 PM   Specimen: Nasal Mucosa; Nasopharyngeal  Result Value Ref Range Status   MRSA by PCR NEGATIVE NEGATIVE Final    Comment:        The GeneXpert MRSA Assay (FDA approved for NASAL specimens only), is one component of a comprehensive MRSA colonization surveillance program. It is not intended to diagnose MRSA infection nor to guide or monitor treatment for MRSA infections. Performed at [facility], [place] [address] [place], [place] [address]   Culture, blood (routine x 2)     Status: None   Collection Time: 11/16/19  6:08 PM  Specimen: BLOOD RIGHT HAND  Result Value Ref Range Status   Specimen Description BLOOD RIGHT HAND  Final   Special Requests   Final    BOTTLES DRAWN AEROBIC ONLY Blood Culture results may not be optimal due to an inadequate volume of blood received in culture bottles   Culture   Final    NO GROWTH 5 DAYS Performed at [facility], [place] [address], [place], [place] [address]    Report Status 11/21/2019 FINAL  Final  Culture, respiratory (non-expectorated)     Status: None   Collection Time: 11/18/19 12:28 PM   Specimen: Tracheal Aspirate; Respiratory  Result Value Ref Range Status   Specimen Description TRACHEAL ASPIRATE  Final   Special Requests NONE  Final   Gram Stain   Final    ABUNDANT WBC PRESENT,BOTH PMN AND MONONUCLEAR ABUNDANT GRAM POSITIVE COCCI ABUNDANT GRAM NEGATIVE RODS FEW GRAM VARIABLE ROD    Culture   Final    ABUNDANT HAEMOPHILUS INFLUENZAE BETA LACTAMASE POSITIVE Performed at [facility], [place] [address] [place], [place] [address]    Report Status 11/20/2019 FINAL  Final  Surgical pcr screen     Status: Abnormal   Collection Time: 11/24/19  7:53 AM   Specimen: Nasal Mucosa; Nasal Swab  Result Value Ref  Range Status   MRSA, PCR NEGATIVE NEGATIVE Final   Staphylococcus aureus POSITIVE (A) NEGATIVE Final    Comment: (NOTE) The Xpert SA Assay (FDA approved for NASAL specimens in patients 71 years of age and older), is one component of a comprehensive surveillance program. It is not intended to diagnose infection nor to guide or monitor treatment. Performed at [facility], [place] [address] [place], [place] [address]   Culture, blood (routine x 2)     Status: None   Collection Time: 11/25/19  3:30 PM   Specimen: BLOOD  Result Value Ref Range Status   Specimen Description BLOOD RIGHT ANTECUBITAL  Final   Special Requests   Final    BOTTLES DRAWN AEROBIC ONLY Blood Culture results may not be optimal due to an inadequate volume of blood received in culture bottles   Culture   Final    NO GROWTH 5 DAYS Performed at [facility], [place] [address] [place], [place] [address]    Report Status 11/30/2019 FINAL  Final  Culture, blood (routine x 2)     Status: None   Collection Time: 11/25/19  3:30 PM   Specimen: BLOOD RIGHT HAND  Result Value Ref Range Status   Specimen Description BLOOD RIGHT HAND  Final   Special Requests   Final    BOTTLES DRAWN AEROBIC AND ANAEROBIC Blood Culture results may not be optimal due to an inadequate volume of blood received in culture bottles   Culture   Final    NO GROWTH 5 DAYS Performed at [facility], [place] [address] [place], [place] [address]    Report Status 11/30/2019 FINAL  Final  Culture, respiratory (non-expectorated)     Status: None   Collection Time: 11/25/19  4:35 PM   Specimen: Tracheal Aspirate; Respiratory  Result Value Ref Range Status   Specimen Description TRACHEAL ASPIRATE  Final   Special Requests NONE  Final   Gram Stain   Final    ABUNDANT WBC PRESENT,BOTH PMN AND MONONUCLEAR FEW GRAM POSITIVE COCCI Performed at [facility], [place] [address] [place], [place] [address]    Culture FEW STAPHYLOCOCCUS  EPIDERMIDIS  Final   Report Status 11/28/2019 FINAL  Final   Organism ID, Bacteria STAPHYLOCOCCUS EPIDERMIDIS  Final      Susceptibility   Staphylococcus epidermidis - MIC*    CIPROFLOXACIN <=0.5 SENSITIVE Sensitive     ERYTHROMYCIN >=8 RESISTANT Resistant     GENTAMICIN <=0.5 SENSITIVE Sensitive     OXACILLIN >=4 RESISTANT Resistant     TETRACYCLINE <=1 SENSITIVE Sensitive     VANCOMYCIN 2 SENSITIVE Sensitive     TRIMETH/SULFA <=10 SENSITIVE Sensitive     CLINDAMYCIN >=8 RESISTANT Resistant     RIFAMPIN <=0.5 SENSITIVE Sensitive     Inducible Clindamycin NEGATIVE Sensitive     * FEW STAPHYLOCOCCUS EPIDERMIDIS    Coagulation Studies: No results for input(s): LABPROT, INR in the last 72 hours.  Urinalysis: No results for input(s): COLORURINE, LABSPEC, PHURINE, GLUCOSEU, HGBUR, BILIRUBINUR, KETONESUR, PROTEINUR, UROBILINOGEN, NITRITE, LEUKOCYTESUR in the last 72 hours.  Invalid input(s): APPERANCEUR    Imaging: DG Abd Portable 1V  Result Date: 12/05/2019 CLINICAL DATA:  Ileus EXAM: PORTABLE ABDOMEN - 1 VIEW COMPARISON:  November 29, 2019 FINDINGS: Feeding tube tip is in the third portion of the duodenum. There is no bowel dilatation or air-fluid level to suggest bowel obstruction. No free air. There is airspace opacity in the lung bases. IMPRESSION: No bowel obstruction or free air. Feeding tube tip in third portion of duodenum. Infiltrate in lung bases. Electronically Signed   By: Lowella Grip III M.D.   On: 12/05/2019 06:55     Medications:   . sodium chloride Stopped (12/01/19 1424)  . feeding supplement (VITAL 1.5 CAL) 1,000 mL (12/06/19 0905)   . amiodarone  400 mg Oral BID  . B-complex with vitamin C  1 tablet Per Tube Daily  . chlorhexidine gluconate (MEDLINE KIT)  15 mL Mouth Rinse BID  . Chlorhexidine Gluconate Cloth  6 each Topical Q0600  . darbepoetin (ARANESP) injection - DIALYSIS  100 mcg Intravenous Q Tue-HD  . docusate  100 mg Per Tube Daily  . feeding  supplement (PRO-STAT SUGAR FREE 64)  30 mL Per Tube TID  . heparin injection (subcutaneous)  5,000 Units Subcutaneous Q8H  . insulin aspart  0-9 Units Subcutaneous Q4H  . mouth rinse  15 mL Mouth Rinse 10 times per day  . midodrine  10 mg Per Tube Q8H  . pantoprazole sodium  40 mg Per Tube QHS  . sodium chloride flush  10-40 mL Intracatheter Q12H  . white petrolatum   Topical Daily   sodium chloride, acetaminophen (TYLENOL) oral liquid 160 mg/5 mL, acetaminophen, ALPRAZolam, fentaNYL (SUBLIMAZE) injection, oxyCODONE, prochlorperazine, sodium chloride flush  Assessment/ Plan:  1. ESRD-s/p HD on 12/02/19 and had drop in BP to 62/46.BP remains soft and may need pressor support vs continued CVVHDif he cannot tolerate IHD.  He appears to tolerated dialysis 1.4 L of 12/05/2019 2. S/p PEA arrest 12/2 and VT arrest 12/8- cardiology IV amiodarone 3. ICHF- not candidate for ICD due to noncompliance and drug abuse. 4. Cardiogenic shock- off pressors   5. Anemia of CKD- s/p blood transfusion 12/01/19. Last dose of darbepoetin was 11/29/2019 will administer next dose 12/06/2019 received 100 mcg 6. VDRF- s/p emergent cricothyroidotomy. Per PCCM 7. H/o polysubstance abuse 8. SHPTH- hold binders due to low phos and npo. 9. Ischemic changes to lower extremities being followed by vein and vascular surgery    LOS: Goodnight @TODAY @10 :16 AM

## 2019-12-06 NOTE — Progress Notes (Signed)
Pt without reliable IV access. Discussed with IV team and leadership in CVICU.   Pt without indication for central line.  -L arm restriction with AVF -R arm with great amount of swelling, superficial clot, and recent infiltration -LE with ble wounds  Only access would be central at this time, again not indicated. Pt is cleared for step down but still req vent which places him in ICU status. He is not receiving any IV medications/infusions at this time.   Decision to hold on central line placement and follow for improvement of R arm and reassess for pIV in next 24-28 hours was made.

## 2019-12-06 NOTE — Progress Notes (Signed)
Patient with IV infiltration on right lower arm yesterday, this morning patient with IV filtration on right upper arm. IV amio transitioned to PO. IV team notified of need for vascular access - unable to stick LUE due to dialysis access and RUE very edematous and painful. Dr. Ruthann Cancer notified who stated she would order doppler studies and discuss with her colleagues next steps for vascular access.  Joellen Jersey, RN

## 2019-12-06 NOTE — Progress Notes (Signed)
Patient transported from 2H13 to 2M03 on vent with no problems.  Ashley Mariner RRT

## 2019-12-06 NOTE — Progress Notes (Signed)
Occupational Therapy Treatment Patient Details Name: [name] [name] MRN: [id number] DOB: 02-Jun-1966 Today's Date: 12/06/2019    History of present illness 53yo male presented post PEA arrest which occurred during iHD treatment as outpatient. This was a witnessed arrest with quick bystander CPR. Emergent cricothyrotomy performed by EMS due to failed intubation with [facility] airway. PMH significant for polysubstance abuse, HTN, ESRD, CHF, nonischemic cardiomyopathy. Trach placed on 12/10.   OT comments  Patient supine in bed and eager to get OOB to recliner.  RN present throughout session, assisting with vent management and lines.  Patient requires max assist for bed mobility, max assist +2 safety for transfers to recliner, total assist for LB ADLs.  Limited by pain, in B LEs/feet and R UE, decreased activity tolerance, and generalized weakness.  Elevated R UE on pillow upon exit, noted increased edema with RN reporting multiple IV infiltrates yesterday. VSS on vent, RR up to 40 during transfer but recovers quickly.  Will follow acutely.    Follow Up Recommendations  CIR;Supervision/Assistance - 24 hour    Equipment Recommendations  3 in 1 bedside commode    Recommendations for Other Services      Precautions / Restrictions Precautions Precautions: Fall Precaution Comments: Ventilated Restrictions Weight Bearing Restrictions: No       Mobility Bed Mobility Overal bed mobility: Needs Assistance Bed Mobility: Supine to Sit     Supine to sit: Max assist;HOB elevated     General bed mobility comments: for LB and trunk support to EOB, attempted initation with B LEs but patient but requires support to complete  Transfers Overall transfer level: Needs assistance Equipment used: 1 person hand held assist Transfers: Sit to/from [facility] Sit to Stand: Max assist;+2 safety/equipment Stand pivot transfers: Mod assist;+2 safety/equipment       General transfer  comment: patient requires max assist to power up from EOB, mod assist to pivot to recliner; +2 for safety with RN managing vent     Balance Overall balance assessment: Needs assistance Sitting-balance support: No upper extremity supported;Feet supported Sitting balance-Leahy Scale: Fair Sitting balance - Comments: supervision EOB    Standing balance support: During functional activity;Bilateral upper extremity supported Standing balance-Leahy Scale: Poor Standing balance comment: relaint on BUE and external support at EOB                            ADL either performed or assessed with clinical judgement   ADL Overall ADL's : Needs assistance/impaired                     Lower Body Dressing: Total assistance;Sit to/from stand;+2 for physical assistance   Toilet Transfer: Maximal assistance;+2 for safety/equipment;Stand-pivot Toilet Transfer Details (indicate cue type and reason): simulated to recliner          Functional mobility during ADLs: Maximal assistance;+2 for safety/equipment;Cueing for safety;Cueing for sequencing       Vision       Perception     Praxis      Cognition Arousal/Alertness: Awake/alert Behavior During Therapy: WFL for tasks assessed/performed Overall Cognitive Status: Difficult to assess                                 General Comments: pt on vent via trach, engages appropriately and follows commands         Exercises  Shoulder Instructions       General Comments pt on vent, VSS; RR upto 40s intermittently during transfer but recovers quickly; RN present throughout session and managing vent; RN reports R UE edema and pain from multiple IV infiltrates, repositioned and elevated on pillows for comfort     Pertinent Vitals/ Pain       Pain Assessment: Faces Faces Pain Scale: Hurts little more Pain Location: BLE, R UE  Pain Descriptors / Indicators: Aching;Discomfort Pain Intervention(s): Monitored  during session;Repositioned;Limited activity within patient's tolerance  Home Living                                          Prior Functioning/Environment              Frequency  Min 2X/week        Progress Toward Goals  OT Goals(current goals can now be found in the care plan section)  Progress towards OT goals: Progressing toward goals  Acute Rehab OT Goals Patient Stated Goal: to get stronger OT Goal Formulation: With patient  Plan Discharge plan remains appropriate;Frequency remains appropriate    Co-evaluation                 AM-PAC OT "6 Clicks" Daily Activity     Outcome Measure   Help from another person eating meals?: Total Help from another person taking care of personal grooming?: A Little Help from another person toileting, which includes using toliet, bedpan, or urinal?: A Lot Help from another person bathing (including washing, rinsing, drying)?: A Lot Help from another person to put on and taking off regular upper body clothing?: A Lot Help from another person to put on and taking off regular lower body clothing?: Total 6 Click Score: 11    End of Session Equipment Utilized During Treatment: Gait belt;Other (comment)(vent )  OT Visit Diagnosis: Other abnormalities of gait and mobility (R26.89);Muscle weakness (generalized) (M62.81);Pain Pain - Right/Left: Right Pain - part of body: Arm;Leg;Ankle and joints of foot(B LEs)   Activity Tolerance Patient tolerated treatment well   Patient Left in chair;with call bell/phone within reach;with nursing/sitter in room   Nurse Communication Mobility status        Time: 1610-9604 OT Time Calculation (min): 28 min  Charges: OT General Charges $OT Visit: 1 Visit OT Treatments $Self Care/Home Management : 8-22 mins $Therapeutic Activity: 8-22 mins  Jolaine Artist, OT [place] Pager [phone] Office [phone]    Delight Stare 12/06/2019,  12:45 PM

## 2019-12-07 ENCOUNTER — Inpatient Hospital Stay (HOSPITAL_COMMUNITY): Payer: Medicare Other

## 2019-12-07 DIAGNOSIS — Z515 Encounter for palliative care: Secondary | ICD-10-CM

## 2019-12-07 DIAGNOSIS — I998 Other disorder of circulatory system: Secondary | ICD-10-CM

## 2019-12-07 LAB — CBC WITH DIFFERENTIAL/PLATELET
Abs Immature Granulocytes: 0.2 10*3/uL — ABNORMAL HIGH (ref 0.00–0.07)
Basophils Absolute: 0.1 10*3/uL (ref 0.0–0.1)
Basophils Relative: 1 %
Eosinophils Absolute: 0.2 10*3/uL (ref 0.0–0.5)
Eosinophils Relative: 2 %
HCT: 25 % — ABNORMAL LOW (ref 39.0–52.0)
Hemoglobin: 7.8 g/dL — ABNORMAL LOW (ref 13.0–17.0)
Immature Granulocytes: 2 %
Lymphocytes Relative: 8 %
Lymphs Abs: 1 10*3/uL (ref 0.7–4.0)
MCH: 28.7 pg (ref 26.0–34.0)
MCHC: 31.2 g/dL (ref 30.0–36.0)
MCV: 91.9 fL (ref 80.0–100.0)
Monocytes Absolute: 1.5 10*3/uL — ABNORMAL HIGH (ref 0.1–1.0)
Monocytes Relative: 12 %
Neutro Abs: 9.3 10*3/uL — ABNORMAL HIGH (ref 1.7–7.7)
Neutrophils Relative %: 75 %
Platelets: 415 10*3/uL — ABNORMAL HIGH (ref 150–400)
RBC: 2.72 MIL/uL — ABNORMAL LOW (ref 4.22–5.81)
RDW: 17.8 % — ABNORMAL HIGH (ref 11.5–15.5)
WBC: 12.3 10*3/uL — ABNORMAL HIGH (ref 4.0–10.5)
nRBC: 0.2 % (ref 0.0–0.2)

## 2019-12-07 LAB — BASIC METABOLIC PANEL
Anion gap: 16 — ABNORMAL HIGH (ref 5–15)
BUN: 72 mg/dL — ABNORMAL HIGH (ref 6–20)
CO2: 24 mmol/L (ref 22–32)
Calcium: 7.8 mg/dL — ABNORMAL LOW (ref 8.9–10.3)
Chloride: 90 mmol/L — ABNORMAL LOW (ref 98–111)
Creatinine, Ser: 5.8 mg/dL — ABNORMAL HIGH (ref 0.61–1.24)
GFR calc Af Amer: 12 mL/min — ABNORMAL LOW (ref >60)
GFR calc non Af Amer: 10 mL/min — ABNORMAL LOW (ref >60)
Glucose, Bld: 95 mg/dL (ref 70–99)
Potassium: 5.5 mmol/L — ABNORMAL HIGH (ref 3.5–5.1)
Sodium: 130 mmol/L — ABNORMAL LOW (ref 135–145)

## 2019-12-07 LAB — GLUCOSE, CAPILLARY
Glucose-Capillary: 100 mg/dL — ABNORMAL HIGH (ref 70–99)
Glucose-Capillary: 124 mg/dL — ABNORMAL HIGH (ref 70–99)
Glucose-Capillary: 79 mg/dL (ref 70–99)
Glucose-Capillary: 81 mg/dL (ref 70–99)
Glucose-Capillary: 85 mg/dL (ref 70–99)
Glucose-Capillary: 92 mg/dL (ref 70–99)
Glucose-Capillary: 99 mg/dL (ref 70–99)

## 2019-12-07 LAB — RENAL FUNCTION PANEL
Albumin: 2.1 g/dL — ABNORMAL LOW (ref 3.5–5.0)
Anion gap: 17 — ABNORMAL HIGH (ref 5–15)
BUN: 73 mg/dL — ABNORMAL HIGH (ref 6–20)
CO2: 24 mmol/L (ref 22–32)
Calcium: 7.8 mg/dL — ABNORMAL LOW (ref 8.9–10.3)
Chloride: 90 mmol/L — ABNORMAL LOW (ref 98–111)
Creatinine, Ser: 5.85 mg/dL — ABNORMAL HIGH (ref 0.61–1.24)
GFR calc Af Amer: 12 mL/min — ABNORMAL LOW (ref >60)
GFR calc non Af Amer: 10 mL/min — ABNORMAL LOW (ref >60)
Glucose, Bld: 97 mg/dL (ref 70–99)
Phosphorus: 2.4 mg/dL — ABNORMAL LOW (ref 2.5–4.6)
Potassium: 5.4 mmol/L — ABNORMAL HIGH (ref 3.5–5.1)
Sodium: 131 mmol/L — ABNORMAL LOW (ref 135–145)

## 2019-12-07 LAB — MAGNESIUM: Magnesium: 2.1 mg/dL (ref 1.7–2.4)

## 2019-12-07 LAB — PHOSPHORUS: Phosphorus: 2.4 mg/dL — ABNORMAL LOW (ref 2.5–4.6)

## 2019-12-07 MED ORDER — ALBUMIN HUMAN 25 % IV SOLN
25.0000 g | Freq: Once | INTRAVENOUS | Status: AC
  Administered 2019-12-07: 25 g via INTRAVENOUS
  Filled 2019-12-07: qty 50

## 2019-12-07 MED ORDER — APIXABAN 2.5 MG PO TABS
2.5000 mg | ORAL_TABLET | Freq: Two times a day (BID) | ORAL | Status: DC
  Administered 2019-12-07 – 2019-12-12 (×11): 2.5 mg via ORAL
  Filled 2019-12-07 (×14): qty 1

## 2019-12-07 MED ORDER — DARBEPOETIN ALFA 100 MCG/0.5ML IJ SOSY
100.0000 ug | PREFILLED_SYRINGE | INTRAMUSCULAR | Status: DC
  Administered 2019-12-07 – 2019-12-14 (×2): 100 ug via INTRAVENOUS
  Filled 2019-12-07 (×5): qty 0.5

## 2019-12-07 NOTE — Progress Notes (Addendum)
Telemetry reviewed with Dr. Lovena Le He remains in rate controlled AFib, no VT/NSVT On amiodarone 400mg  PO BID Eliquis started yesterday for a/c (via CCM)  Continue amiodarone  currently the patient not felt to be EP procedure candidate Should he make considerable recovery could revisit.   For now, we will sign off, though remain available please recall if needed  Tommye Standard, PA-C  EP Attending  Chart and tele reviewed. Agree with above. The patient remains in NSR on amiodarone. Continue. With trach and encephalopathy, not a candidate for ICD. WWould continue amiodarone 400 bid . Call for additional issues.  Mikle Bosworth.D.

## 2019-12-07 NOTE — Progress Notes (Addendum)
Progress Note    12/07/2019 8:43 AM   Subjective:  C/o pain  Follow-up ischemic skin changes to bilateral lower extremities secondary to vassopressors at time of PEA arrest.         Vitals:   12/07/19 0756 12/07/19 0800  BP:  121/76  Pulse:  89  Resp:  (!) 21  Temp: 98.2 F (36.8 C)   SpO2:  100%    Physical Exam: Cardiac:  RRR Lungs:  CTA bil Incisions:  n/a Extremities:  Dressings removed and both lower legs inspected.  Bullous lesions noted of anterior aspects of both knees and distal lower legs.  There is some sloughing of skin of these areas without signs of infection.  Also, noted are bullous lesion of of right forefoot and left great toe. Skin intact. He has bilateral triphasic pedal Doppler signals.   CBC    Component Value Date/Time   WBC 12.3 (H) 12/07/2019 0246   RBC 2.72 (L) 12/07/2019 0246   HGB 7.8 (L) 12/07/2019 0246   HGB 12.3 (L) 12/31/2017 1012   HCT 25.0 (L) 12/07/2019 0246   HCT 37.4 (L) 12/31/2017 1012   PLT 415 (H) 12/07/2019 0246   PLT 176 12/31/2017 1012   MCV 91.9 12/07/2019 0246   MCV 99 (H) 12/31/2017 1012   MCH 28.7 12/07/2019 0246   MCHC 31.2 12/07/2019 0246   RDW 17.8 (H) 12/07/2019 0246   RDW 18.8 (H) 12/31/2017 1012   LYMPHSABS 1.0 12/07/2019 0246   LYMPHSABS 1.0 12/31/2017 1012   MONOABS 1.5 (H) 12/07/2019 0246   EOSABS 0.2 12/07/2019 0246   EOSABS 0.1 12/31/2017 1012   BASOSABS 0.1 12/07/2019 0246   BASOSABS 0.0 12/31/2017 1012    BMET    Component Value Date/Time   NA 131 (L) 12/07/2019 0246   NA 130 (L) 12/07/2019 0246   K 5.4 (H) 12/07/2019 0246   K 5.5 (H) 12/07/2019 0246   CL 90 (L) 12/07/2019 0246   CL 90 (L) 12/07/2019 0246   CO2 24 12/07/2019 0246   CO2 24 12/07/2019 0246   GLUCOSE 97 12/07/2019 0246   GLUCOSE 95 12/07/2019 0246   BUN 73 (H) 12/07/2019 0246   BUN 72 (H) 12/07/2019 0246   CREATININE 5.85 (H) 12/07/2019 0246   CREATININE 5.80 (H) 12/07/2019 0246   CALCIUM 7.8 (L) 12/07/2019 0246   CALCIUM 7.8 (L) 12/07/2019 0246   CALCIUM 8.2 (L) 10/21/2010 1420   GFRNONAA 10 (L) 12/07/2019 0246   GFRNONAA 10 (L) 12/07/2019 0246   GFRAA 12 (L) 12/07/2019 0246   GFRAA 12 (L) 12/07/2019 0246     Intake/Output Summary (Last 24 hours) at 12/07/2019 0843 Last data filed at 12/07/2019 0800 Gross per 24 hour  Intake 1339.58 ml  Output 100 ml  Net 1239.58 ml    HOSPITAL MEDICATIONS Scheduled Meds: . amiodarone  400 mg Oral BID  . B-complex with vitamin C  1 tablet Per Tube Daily  . chlorhexidine gluconate (MEDLINE KIT)  15 mL Mouth Rinse BID  . Chlorhexidine Gluconate Cloth  6 each Topical Q0600  . Chlorhexidine Gluconate Cloth  6 each Topical Q0600  . darbepoetin (ARANESP) injection - DIALYSIS  100 mcg Intravenous Q Tue-HD  . docusate  100 mg Per Tube Daily  . feeding supplement (PRO-STAT SUGAR FREE 64)  30 mL Per Tube TID  . heparin injection (subcutaneous)  5,000 Units Subcutaneous Q8H  . insulin aspart  0-9 Units Subcutaneous Q4H  . mouth rinse  15 mL Mouth Rinse  10 times per day  . midodrine  10 mg Per Tube Q8H  . pantoprazole sodium  40 mg Per Tube QHS  . sodium chloride flush  10-40 mL Intracatheter Q12H  . white petrolatum   Topical Daily   Continuous Infusions: . sodium chloride Stopped (12/01/19 1424)  . feeding supplement (VITAL 1.5 CAL) 1,000 mL (12/07/19 0823)   PRN Meds:.sodium chloride, acetaminophen (TYLENOL) oral liquid 160 mg/5 mL, acetaminophen, ALPRAZolam, fentaNYL (SUBLIMAZE) injection, oxyCODONE, prochlorperazine, sodium chloride flush  Assessment:  53 y.o. male is s/p PEA arrest with ischemia to lower extremities secondary to vassopressors.  Ischemia confined to skin as described.    13 Days Post-Op  Plan: -Continue xeroform/Kerlix to both lower extremities.  Monitor for worsening tissue loss/infection -DVT prophylaxis:  Sub q heparin   Risa Grill, PA-C [facility] [phone] 12/07/2019  8:43 AM   I agree with the  above.  I have seen and evaluated the patient.  He continues to have blisters to both feet with excellent Doppler signals.  There are no acute indications for amputation at this time.  We will continue to let his feet demarcate.  Hopefully he will avoid amputation.  Annamarie Major

## 2019-12-07 NOTE — Progress Notes (Signed)
Physical Therapy Treatment Patient Details Name: [name] [name] MRN: [id number] DOB: Feb 04, 1966 Today's Date: 12/07/2019    History of Present Illness 53yo male presented post PEA arrest which occurred during iHD treatment as outpatient. This was a witnessed arrest with quick bystander CPR. Emergent cricothyrotomy performed by EMS due to failed intubation with [facility] airway. PMH significant for polysubstance abuse, HTN, ESRD, CHF, nonischemic cardiomyopathy. Trach placed on 12/10.    PT Comments    Patient on PS/CPAP via trach with sats 95%, RR 22, HR 92 on arrival. Grimacing in pain due to sacral ulcer and wanting to get OOB. Patient able to perform more of the effort of mobility (less physical assist) than previous sessions with RR max 46 (seated EOB and decr to 32 with vc in 45 seconds, decr to 22 in 90 sec); sats >90% throughout and HR max 108. Patient reports he has tolerated sitting up in chair (on blue air cushion up to 1 hour). Educated in pressure relief and trying to sit up for up to 2 hours for benefit of lungs. Feet down on floor to reduce sacral pressure. RN aware of plan and possible need for stedy to return to bed based on fatigue from weaning.     Follow Up Recommendations  CIR;Supervision/Assistance - 24 hour     Equipment Recommendations  (defer to post-acute setting)    Recommendations for Other Services       Precautions / Restrictions Precautions Precautions: Fall Precaution Comments: Ventilated, rectal tube, feeding tube Restrictions Weight Bearing Restrictions: No    Mobility  Bed Mobility Overal bed mobility: Needs Assistance Bed Mobility: Rolling;Sidelying to Sit Rolling: Mod assist;+2 for safety/equipment Sidelying to sit: Mod assist;+2 for safety/equipment;HOB elevated(with rail)       General bed mobility comments: vc for technique (previously has done supine to sit, HOB elevated); assist to elevate torso (complicated by lines and where he could push  with UEs)  Transfers Overall transfer level: Needs assistance Equipment used: 2 person hand held assist Transfers: Sit to/from Stand Sit to Stand: +2 safety/equipment;Mod assist         General transfer comment: attempted stand with bed lowest height and not successful; elevated bed ~4 inches to stand  Ambulation/Gait Ambulation/Gait assistance: Min assist;+2 physical assistance;+2 safety/equipment Gait Distance (Feet): 2 Feet Assistive device: 2 person hand held assist Gait Pattern/deviations: Shuffle Gait velocity: reduced   General Gait Details: short shuffling side steps to turn from bed to recliner; no buckling   Stairs             Wheelchair Mobility    Modified Rankin (Stroke Patients Only)       Balance Overall balance assessment: Needs assistance Sitting-balance support: No upper extremity supported;Feet supported Sitting balance-Leahy Scale: Fair Sitting balance - Comments: supervision EOB  Postural control: (offloading R hip) Standing balance support: During functional activity;Bilateral upper extremity supported Standing balance-Leahy Scale: Poor Standing balance comment: very little support via bil UEs, even during steps to chair                            Cognition Arousal/Alertness: Awake/alert Behavior During Therapy: WFL for tasks assessed/performed Overall Cognitive Status: Difficult to assess                                 General Comments: pt on vent via trach, engages appropriately and follows commands  Exercises General Exercises - Lower Extremity Ankle Circles/Pumps: AROM;Both;10 reps Long Arc Quad: AROM;Both;10 reps;Seated Hip ABduction/ADduction: Both;Strengthening;10 reps;Seated(isometric, resisted by PT) Heel Raises: AROM;Both;5 reps;Seated    General Comments        Pertinent Vitals/Pain Pain Assessment: Faces Faces Pain Scale: Hurts whole lot Pain Location: sacrum (per RN stage II  ulcer) Pain Descriptors / Indicators: Discomfort;Grimacing Pain Intervention(s): Limited activity within patient's tolerance;Monitored during session;Repositioned    Home Living                      Prior Function            PT Goals (current goals can now be found in the care plan section) Acute Rehab PT Goals Patient Stated Goal: to get stronger Time For Goal Achievement: 12/14/19 Potential to Achieve Goals: Fair Progress towards PT goals: Progressing toward goals    Frequency    Min 3X/week      PT Plan Current plan remains appropriate    Co-evaluation              AM-PAC PT "6 Clicks" Mobility   Outcome Measure  Help needed turning from your back to your side while in a flat bed without using bedrails?: A Little Help needed moving from lying on your back to sitting on the side of a flat bed without using bedrails?: A Lot Help needed moving to and from a bed to a chair (including a wheelchair)?: A Lot Help needed standing up from a chair using your arms (e.g., wheelchair or bedside chair)?: A Lot Help needed to walk in hospital room?: A Lot Help needed climbing 3-5 steps with a railing? : Total 6 Click Score: 12    End of Session Equipment Utilized During Treatment: Oxygen Activity Tolerance: Patient tolerated treatment well(first session on PS/CPAP (?)) Patient left: in chair;with call bell/phone within reach;with chair alarm set Nurse Communication: Mobility status;Other (comment)(feet on floor due to sacral pain; ?use stedy if fatigued) PT Visit Diagnosis: Unsteadiness on feet (R26.81);Muscle weakness (generalized) (M62.81)     Time: 9643-8381 PT Time Calculation (min) (ACUTE ONLY): 40 min  Charges:  $Therapeutic Exercise: 23-37 mins $Therapeutic Activity: 8-22 mins                      Arby Barrette, PT Pager [phone]    Rexanne Mano 12/07/2019, 9:49 AM

## 2019-12-07 NOTE — Progress Notes (Signed)
[facility] ROUNDING NOTE   Subjective:   This 53 year old male status post PEA arrest occurred during hemodialysis treatments outpatient 11/16/2019.[name] [name]  This was a witnessed arrest with bystander CPR.  Emergent cricothyroidotomy performed by EMS.  Due to failed intubation with [facility] airway.  Does have a past medical history that is pertinent for polysubstance hypertension stage renal disease ischemic cardiomyopathy-2D echo 11/17/2019 showed ejection fraction less than 20%.   CRRT was initiated 11/18/2019.  Status post intermittent hemodialysis initiated on 12/02/2019.  On 12/21 he underwent dialysis and tolerated 1.4 L removed.  He is scheduled for dialysis 12/07/2019.  Hospital course has been complicated by lower extremity vascular ischemia secondary to cardiac arrest and need for pressors.  He was evaluated by Dr. Annamarie Major.  Appreciate the assistance.  Blood pressure 145/84 pulse 96 temperature 98.2 O2 sats 96% FiO2 40% ventilator  Sodium 131 potassium 5.4 chloride 90 CO2 24 BUN 73 creatinine 5.85 glucose 97 calcium 7.8 phosphorus 2.4 magnesium 2.1 albumin 2.1 WBC 12.3 hemoglobin 7.8 platelets 415  Amiodarone 400 mg twice daily apixaban 2.5 mg twice daily, insulin sliding scale, Protonix 40 mg nightly     Objective:  Vital signs in last 24 hours:  Temp:  [98.2 F (36.8 C)-99.1 F (37.3 C)] 98.2 F (36.8 C) (12/23 0756) Pulse Rate:  [79-103] 89 (12/23 0800) Resp:  [18-42] 21 (12/23 0800) BP: (89-129)/(54-90) 121/76 (12/23 0800) SpO2:  [88 %-100 %] 100 % (12/23 0800) FiO2 (%):  [40 %-60 %] 40 % (12/23 0741) Weight:  [79 kg] 79 kg (12/23 0500)  Weight change: -2.6 kg Filed Weights   12/05/19 1239 12/06/19 0500 12/07/19 0500  Weight: 80.2 kg 78.7 kg 79 kg    Intake/Output: I/O last 3 completed shifts: In: 2083.5 [I.V.:223.5; NG/GT:1860] Out: 200 [Stool:200]   Intake/Output this shift:  Total I/O In: 50 [NG/GT:50] Out: 0   Gen: sitting up on edge of bed on  vent via trach CVS: no rub Resp: scattered rhonchi Abd:+BS, soft, NT/Nd Ext: +ischemic changes to bilateral feet with edema, LUE AVF +T/B  Basic Metabolic Panel: Recent Labs  Lab 11/30/19 2236 12/03/19 0418 12/04/19 0322 12/05/19 0215 12/06/19 0218 12/07/19 0246  NA 135 134* 132* 131* 134* 130*  131*  K 3.3* 3.7 4.3 5.1 4.5 5.5*  5.4*  CL 99 94* 92* 92* 93* 90*  90*  CO2 25 25 24 24 25 24  24   GLUCOSE 106* 118* 113* 116* 112* 95  97  BUN 31* 39* 65* 85* 47* 72*  73*  CREATININE 2.74* 4.07* 5.37* 6.65* 4.52* 5.80*  5.85*  CALCIUM 7.5* 7.7* 7.4* 7.4* 7.8* 7.8*  7.8*  MG 2.0  --   --   --  1.9 2.1  PHOS  --  2.0* 2.5 2.8 2.3* 2.4*  2.4*    Liver Function Tests: Recent Labs  Lab 12/03/19 0418 12/04/19 0322 12/05/19 0215 12/06/19 0218 12/07/19 0246  AST 19 16  --   --   --   ALT 21 18  --   --   --   ALKPHOS 113 114  --   --   --   BILITOT 0.8 0.9  --   --   --   PROT 6.5 6.5  --   --   --   ALBUMIN 2.2*  2.2* 2.2*  2.1* 2.2* 2.1* 2.1*   No results for input(s): LIPASE, AMYLASE in the last 168 hours. No results for input(s): AMMONIA in the last 168 hours.  CBC: Recent Labs  Lab 12/03/19 0418 12/04/19 0322 12/05/19 0215 12/06/19 0218 12/07/19 0246  WBC 13.8* 13.7* 14.7* 10.9* 12.3*  NEUTROABS 10.8* 10.7* 11.1* 8.3* 9.3*  HGB 7.4* 7.6* 8.1* 7.2* 7.8*  HCT 22.7* 23.1* 24.8* 22.8* 25.0*  MCV 90.8 89.5 89.5 91.2 91.9  PLT 428* 457* 476* 417* 415*    Cardiac Enzymes: No results for input(s): CKTOTAL, CKMB, CKMBINDEX, TROPONINI in the last 168 hours.  BNP: Invalid input(s): POCBNP  CBG: Recent Labs  Lab 12/06/19 1839 12/06/19 1930 12/07/19 0003 12/07/19 0437 12/07/19 0719  GLUCAP 101* 82 99 81 92    Microbiology: Results for orders placed or performed during the hospital encounter of 11/16/19  SARS Coronavirus 2 by RT PCR (hospital order, performed in [facility] hospital lab) Nasopharyngeal Nasopharyngeal Swab     Status: None    Collection Time: 11/16/19 11:41 AM   Specimen: Nasopharyngeal Swab  Result Value Ref Range Status   SARS Coronavirus 2 NEGATIVE NEGATIVE Final    Comment: (NOTE) SARS-CoV-2 target nucleic acids are NOT DETECTED. The SARS-CoV-2 RNA is generally detectable in upper and lower respiratory specimens during the acute phase of infection. The lowest concentration of SARS-CoV-2 viral copies this assay can detect is 250 copies / mL. A negative result does not preclude SARS-CoV-2 infection and should not be used as the sole basis for treatment or other patient management decisions.  A negative result may occur with improper specimen collection / handling, submission of specimen other than nasopharyngeal swab, presence of viral mutation(s) within the areas targeted by this assay, and inadequate number of viral copies (<250 copies / mL). A negative result must be combined with clinical observations, patient history, and epidemiological information. Fact Sheet for Patients:   StrictlyIdeas.no Fact Sheet for Healthcare Providers: BankingDealers.co.za This test is not yet approved or cleared  by the [place] FDA and has been authorized for detection and/or diagnosis of SARS-CoV-2 by FDA under an Emergency Use Authorization (EUA).  This EUA will remain in effect (meaning this test can be used) for the duration of the COVID-19 declaration under Section 564(b)(1) of the Act, 21 U.S.C. section 360bbb-3(b)(1), unless the authorization is terminated or revoked sooner. Performed at [facility], [place] [address] [place], [place] [address]   Culture, blood (routine x 2)     Status: None   Collection Time: 11/16/19  6:03 PM   Specimen: BLOOD RIGHT HAND  Result Value Ref Range Status   Specimen Description BLOOD RIGHT HAND  Final   Special Requests   Final    BOTTLES DRAWN AEROBIC AND ANAEROBIC Blood Culture results may not be optimal due to an  inadequate volume of blood received in culture bottles   Culture   Final    NO GROWTH 5 DAYS Performed at [facility], [place] [address], [place], [place] [address]    Report Status 11/21/2019 FINAL  Final  MRSA PCR Screening     Status: None   Collection Time: 11/16/19  6:05 PM   Specimen: Nasal Mucosa; Nasopharyngeal  Result Value Ref Range Status   MRSA by PCR NEGATIVE NEGATIVE Final    Comment:        The GeneXpert MRSA Assay (FDA approved for NASAL specimens only), is one component of a comprehensive MRSA colonization surveillance program. It is not intended to diagnose MRSA infection nor to guide or monitor treatment for MRSA infections. Performed at [facility], [place] [address] [place], [place] [address]  Culture, blood (routine x 2)     Status: None   Collection Time: 11/16/19  6:08 PM   Specimen: BLOOD RIGHT HAND  Result Value Ref Range Status   Specimen Description BLOOD RIGHT HAND  Final   Special Requests   Final    BOTTLES DRAWN AEROBIC ONLY Blood Culture results may not be optimal due to an inadequate volume of blood received in culture bottles   Culture   Final    NO GROWTH 5 DAYS Performed at [facility], [place] [address], [place], [place] [address]    Report Status 11/21/2019 FINAL  Final  Culture, respiratory (non-expectorated)     Status: None   Collection Time: 11/18/19 12:28 PM   Specimen: Tracheal Aspirate; Respiratory  Result Value Ref Range Status   Specimen Description TRACHEAL ASPIRATE  Final   Special Requests NONE  Final   Gram Stain   Final    ABUNDANT WBC PRESENT,BOTH PMN AND MONONUCLEAR ABUNDANT GRAM POSITIVE COCCI ABUNDANT GRAM NEGATIVE RODS FEW GRAM VARIABLE ROD    Culture   Final    ABUNDANT HAEMOPHILUS INFLUENZAE BETA LACTAMASE POSITIVE Performed at [facility], [place] [address], [place], [place] [address]    Report Status 11/20/2019 FINAL  Final  Surgical pcr screen     Status: Abnormal   Collection Time:  11/24/19  7:53 AM   Specimen: Nasal Mucosa; Nasal Swab  Result Value Ref Range Status   MRSA, PCR NEGATIVE NEGATIVE Final   Staphylococcus aureus POSITIVE (A) NEGATIVE Final    Comment: (NOTE) The Xpert SA Assay (FDA approved for NASAL specimens in patients 41 years of age and older), is one component of a comprehensive surveillance program. It is not intended to diagnose infection nor to guide or monitor treatment. Performed at [facility], [place] [address] [place], [place] [address]   Culture, blood (routine x 2)     Status: None   Collection Time: 11/25/19  3:30 PM   Specimen: BLOOD  Result Value Ref Range Status   Specimen Description BLOOD RIGHT ANTECUBITAL  Final   Special Requests   Final    BOTTLES DRAWN AEROBIC ONLY Blood Culture results may not be optimal due to an inadequate volume of blood received in culture bottles   Culture   Final    NO GROWTH 5 DAYS Performed at [facility], [place] [address] [place], [place] [address]    Report Status 11/30/2019 FINAL  Final  Culture, blood (routine x 2)     Status: None   Collection Time: 11/25/19  3:30 PM   Specimen: BLOOD RIGHT HAND  Result Value Ref Range Status   Specimen Description BLOOD RIGHT HAND  Final   Special Requests   Final    BOTTLES DRAWN AEROBIC AND ANAEROBIC Blood Culture results may not be optimal due to an inadequate volume of blood received in culture bottles   Culture   Final    NO GROWTH 5 DAYS Performed at [facility], [place] [address] [place], [place] [address]    Report Status 11/30/2019 FINAL  Final  Culture, respiratory (non-expectorated)     Status: None   Collection Time: 11/25/19  4:35 PM   Specimen: Tracheal Aspirate; Respiratory  Result Value Ref Range Status   Specimen Description TRACHEAL ASPIRATE  Final   Special Requests NONE  Final   Gram Stain   Final    ABUNDANT WBC PRESENT,BOTH PMN AND MONONUCLEAR FEW GRAM POSITIVE  COCCI Performed at [facility],  [place] [address] [place], [place] [address]    Culture FEW STAPHYLOCOCCUS EPIDERMIDIS  Final   Report Status 11/28/2019 FINAL  Final   Organism ID, Bacteria STAPHYLOCOCCUS EPIDERMIDIS  Final      Susceptibility   Staphylococcus epidermidis - MIC*    CIPROFLOXACIN <=0.5 SENSITIVE Sensitive     ERYTHROMYCIN >=8 RESISTANT Resistant     GENTAMICIN <=0.5 SENSITIVE Sensitive     OXACILLIN >=4 RESISTANT Resistant     TETRACYCLINE <=1 SENSITIVE Sensitive     VANCOMYCIN 2 SENSITIVE Sensitive     TRIMETH/SULFA <=10 SENSITIVE Sensitive     CLINDAMYCIN >=8 RESISTANT Resistant     RIFAMPIN <=0.5 SENSITIVE Sensitive     Inducible Clindamycin NEGATIVE Sensitive     * FEW STAPHYLOCOCCUS EPIDERMIDIS    Coagulation Studies: No results for input(s): LABPROT, INR in the last 72 hours.  Urinalysis: No results for input(s): COLORURINE, LABSPEC, PHURINE, GLUCOSEU, HGBUR, BILIRUBINUR, KETONESUR, PROTEINUR, UROBILINOGEN, NITRITE, LEUKOCYTESUR in the last 72 hours.  Invalid input(s): APPERANCEUR    Imaging: AM DG Chest Port 1 View  Result Date: 12/07/2019 CLINICAL DATA:  Respiratory failure EXAM: PORTABLE CHEST 1 VIEW COMPARISON:  Three days ago FINDINGS: Tracheostomy tube remains well seated. The feeding tube at least reaches the stomach. Bilateral airspace disease and layering pleural fluid with cardiomegaly. No evidence of pneumothorax. IMPRESSION: Stable hardware positioning, cardiomegaly, airspace disease, and layering pleural fluid. Electronically Signed   By: Monte Fantasia M.D.   On: 12/07/2019 06:20   VAS [place] UPPER EXTREMITY VENOUS DUPLEX  Result Date: 12/06/2019 UPPER VENOUS STUDY  Indications: Swelling, and Pain Limitations: Bandages, line and patient pain tolerance. Comparison Study: no prior Performing Technologist: Abram Sander RVS  Examination Guidelines: A complete evaluation includes B-mode imaging, spectral Doppler, color Doppler, and power Doppler as needed of all accessible portions of each  vessel. Bilateral testing is considered an integral part of a complete examination. Limited examinations for reoccurring indications may be performed as noted.  Right Findings: +----------+------------+---------+-----------+----------+---------------------+ RIGHT     CompressiblePhasicitySpontaneousProperties       Summary        +----------+------------+---------+-----------+----------+---------------------+ IJV                                                    Not visualized     +----------+------------+---------+-----------+----------+---------------------+ Subclavian    Full       Yes       Yes                                    +----------+------------+---------+-----------+----------+---------------------+ Axillary      Full       Yes       Yes                                    +----------+------------+---------+-----------+----------+---------------------+ Brachial                 Yes       Yes               unable to compress  due to patient pain                                                            tolerance       +----------+------------+---------+-----------+----------+---------------------+ Radial        Full                                                        +----------+------------+---------+-----------+----------+---------------------+ Ulnar         Full                                                        +----------+------------+---------+-----------+----------+---------------------+ Cephalic      None                                    Age Indeterminate   +----------+------------+---------+-----------+----------+---------------------+ Basilic                  Yes       Yes               unable to compress                                                        due to patient pain                                                             tolerance       +----------+------------+---------+-----------+----------+---------------------+  Summary:  Right: No evidence of deep vein thrombosis in the upper extremity. Findings consistent with age indeterminate superficial vein thrombosis involving the right cephalic vein.  *See table(s) above for measurements and observations.  Diagnosing physician: Deitra Mayo MD Electronically signed by Deitra Mayo MD on 12/06/2019 at 5:20:39 PM.    Final      Medications:   . sodium chloride Stopped (12/01/19 1424)  . feeding supplement (VITAL 1.5 CAL) 1,000 mL (12/07/19 0823)   . amiodarone  400 mg Oral BID  . apixaban  2.5 mg Oral BID  . B-complex with vitamin C  1 tablet Per Tube Daily  . chlorhexidine gluconate (MEDLINE KIT)  15 mL Mouth Rinse BID  . Chlorhexidine Gluconate Cloth  6 each Topical Q0600  . Chlorhexidine Gluconate Cloth  6 each Topical Q0600  . darbepoetin (ARANESP) injection - DIALYSIS  100 mcg Intravenous Q Tue-HD  . docusate  100 mg Per Tube Daily  . feeding supplement (PRO-STAT SUGAR FREE 64)  30 mL Per Tube TID  . insulin  aspart  0-9 Units Subcutaneous Q4H  . mouth rinse  15 mL Mouth Rinse 10 times per day  . midodrine  10 mg Per Tube Q8H  . pantoprazole sodium  40 mg Per Tube QHS  . sodium chloride flush  10-40 mL Intracatheter Q12H  . white petrolatum   Topical Daily   sodium chloride, acetaminophen (TYLENOL) oral liquid 160 mg/5 mL, acetaminophen, ALPRAZolam, fentaNYL (SUBLIMAZE) injection, oxyCODONE, prochlorperazine, sodium chloride flush  Assessment/ Plan:  1. ESRD-s/p HD on 12/02/19 and had drop in BP to 62/46.BP remains soft and may need pressor support vs continued CVVHDif he cannot tolerate IHD.  He appears to tolerated dialysis 1.4 L of 12/05/2019 and is scheduled for his next dialysis treatment 12/07/2019 2. S/p PEA arrest 12/2 and VT arrest 12/8- cardiology loaded with amiodarone 400 mg twice daily and started on apixaban 2.5 mg twice  daily 12/06/2019 3. ICHF- not candidate for ICD due to noncompliance and drug abuse. 4. Cardiogenic shock- off pressors   5. Anemia of CKD- s/p blood transfusion 12/01/19. Last dose of darbepoetin  12/06/2019 received 100 mcg 6. VDRF- s/p emergent cricothyroidotomy. Per PCCM 7. H/o polysubstance abuse 8. SHPTH- hold binders due to low phos and npo. 9. Ischemic changes to lower extremities being followed by vein and vascular surgery    LOS: [place] @TODAY @10 :19 AM

## 2019-12-07 NOTE — Progress Notes (Signed)
[facility] PROGRESS NOTE  [name] [name] HEN:[id number] DOB: 06/22/1966 DOA: 11/16/2019 PCP: Jean Rosenthal, MD  Brief summary   53 year old man with PMH of HTN, CHF, Alcohol use, Substance use, ESRD on HD is admitted with PEA arrest at HD unit. Emergent cricothyrotomy performed by EMS due to failed intubation with [facility] airway.  Assessment/Plan:  Cardiac arrest. V fib/arrythmias. In the setting of combined systolic and diastolic CHF, NSVT, prolonged QT -Transition to p.o. amiodarone. Not a candidate for internal cardiac defibrillator per EP. Cont monitoring on tele   A fib: HR is controlled. On amiodarone p.o. Pt was briefly on iv heparin. Then stopped due to low hemoglobin -restarted on apixaban at 2.5 mg by PCCM on 12/23  Acute on chronic respiratory failure, difficult intubation needing cric- s/p  Trach .  -remains on vent. Cont per pulmonology   Subsegmental PE: . Pt was briefly on iv heparin. Then stopped due to low hemoglobin -restarted on apixaban at 2.5 mg by PCCM on 12/23. may need to increase if no s/s of acute bleeds   ESRD-intermittent HD. Cont per nephrology   Hx polysustance abuse- cocaine, alcohol, tobacco. Monitor no s/s of etoh withdrawals   Ileus-resolved as documented by KUB  Anemia normocytic. No s/s of acute bleeding. Plan to restart heprin on 12/24. Monitor Hg, occult stool blood test prn   Ischemic changes of feet -due to low flow, obtain vascular input, now off IV heparin, until 12/17. Seen by VVS 12/18 - VVS consult concerned that patient iat risk for amputation of lower extremity and blisters are signaling ischemia. Recommending supportive care and await demarcation. Patient not interested in amputation   Code Status: full Family Communication: d/w patient, RN (indicate person spoken with, relationship, and if by phone, the number) Disposition Plan: remains inpatient    Consultants: Nephrology Electrophysiology ENT [place] 12/18    Procedures: 12/10-trach 12/4 RT fem HD cath >> out 12/2 RT [place] CVL >>  Antibiotics: Anti-infectives (From admission, onward)   Start     Dose/Rate Route Frequency Ordered Stop   11/22/19 1800  cefTRIAXone (ROCEPHIN) 2 g in sodium chloride 0.9 % 100 mL IVPB     2 g 200 mL/hr over 30 Minutes Intravenous Every 24 hours 11/22/19 0723 11/27/19 1820   11/18/19 1300  Ampicillin-Sulbactam (UNASYN) 3 g in sodium chloride 0.9 % 100 mL IVPB  Status:  Discontinued     3 g 200 mL/hr over 30 Minutes Intravenous Every 8 hours 11/18/19 1230 11/22/19 0722   11/18/19 1200  ampicillin-sulbactam (UNASYN) 1.5 g in sodium chloride 0.9 % 100 mL IVPB  Status:  Discontinued     1.5 g 200 mL/hr over 30 Minutes Intravenous Every 12 hours 11/18/19 1150 11/18/19 1230        (indicate start date, and stop date if known)  HPI/Subjective: Remains on vent. No acute distress. D/w vascular surgery regarding ischemia in LE. Pulmonology is following patient while in ICU  Objective: Vitals:   12/07/19 0756 12/07/19 0800  BP:  121/76  Pulse:  89  Resp:  (!) 21  Temp: 98.2 F (36.8 C)   SpO2:  100%    Intake/Output Summary (Last 24 hours) at 12/07/2019 0954 Last data filed at 12/07/2019 0800 Gross per 24 hour  Intake 1339.58 ml  Output 100 ml  Net 1239.58 ml   Filed Weights   12/05/19 1239 12/06/19 0500 12/07/19 0500  Weight: 80.2 kg 78.7 kg 79 kg    Exam:   General:  No distress   Cardiovascular: s1,s2 rrr  Respiratory: diminished at bases   Abdomen: soft, nt   Musculoskeletal: pedal edema    Data Reviewed: Basic Metabolic Panel: Recent Labs  Lab 11/30/19 2236 12/03/19 0418 12/04/19 0322 12/05/19 0215 12/06/19 0218 12/07/19 0246  NA 135 134* 132* 131* 134* 130*  131*  K 3.3* 3.7 4.3 5.1 4.5 5.5*  5.4*  CL 99 94* 92* 92* 93* 90*  90*  CO2 25 25 24 24 25 24  24  GLUCOSE 106* 118* 113* 116* 112* 95  97  BUN 31* 39* 65* 85* 47* 72*  73*  CREATININE 2.74* 4.07* 5.37* 6.65*  4.52* 5.80*  5.85*  CALCIUM 7.5* 7.7* 7.4* 7.4* 7.8* 7.8*  7.8*  MG 2.0  --   --   --  1.9 2.1  PHOS  --  2.0* 2.5 2.8 2.3* 2.4*  2.4*   Liver Function Tests: Recent Labs  Lab 12/03/19 0418 12/04/19 0322 12/05/19 0215 12/06/19 0218 12/07/19 0246  AST 19 16  --   --   --   ALT 21 18  --   --   --   ALKPHOS 113 114  --   --   --   BILITOT 0.8 0.9  --   --   --   PROT 6.5 6.5  --   --   --   ALBUMIN 2.2*  2.2* 2.2*  2.1* 2.2* 2.1* 2.1*   No results for input(s): LIPASE, AMYLASE in the last 168 hours. No results for input(s): AMMONIA in the last 168 hours. CBC: Recent Labs  Lab 12/03/19 0418 12/04/19 0322 12/05/19 0215 12/06/19 0218 12/07/19 0246  WBC 13.8* 13.7* 14.7* 10.9* 12.3*  NEUTROABS 10.8* 10.7* 11.1* 8.3* 9.3*  HGB 7.4* 7.6* 8.1* 7.2* 7.8*  HCT 22.7* 23.1* 24.8* 22.8* 25.0*  MCV 90.8 89.5 89.5 91.2 91.9  PLT 428* 457* 476* 417* 415*   Cardiac Enzymes: No results for input(s): CKTOTAL, CKMB, CKMBINDEX, TROPONINI in the last 168 hours. BNP (last 3 results) Recent Labs    11/16/19 1356  BNP 962.8*    ProBNP (last 3 results) No results for input(s): PROBNP in the last 8760 hours.  CBG: Recent Labs  Lab 12/06/19 1839 12/06/19 1930 12/07/19 0003 12/07/19 0437 12/07/19 0719  GLUCAP 101* 82 99 81 92    No results found for this or any previous visit (from the past 240 hour(s)).   Studies: AM DG Chest Port 1 View  Result Date: 12/07/2019 CLINICAL DATA:  Respiratory failure EXAM: PORTABLE CHEST 1 VIEW COMPARISON:  Three days ago FINDINGS: Tracheostomy tube remains well seated. The feeding tube at least reaches the stomach. Bilateral airspace disease and layering pleural fluid with cardiomegaly. No evidence of pneumothorax. IMPRESSION: Stable hardware positioning, cardiomegaly, airspace disease, and layering pleural fluid. Electronically Signed   By: Jonathon  Watts M.D.   On: 12/07/2019 06:20   VAS US UPPER EXTREMITY VENOUS DUPLEX  Result Date:  12/06/2019 UPPER VENOUS STUDY  Indications: Swelling, and Pain Limitations: Bandages, line and patient pain tolerance. Comparison Study: no prior Performing Technologist: Megan Riddle RVS  Examination Guidelines: A complete evaluation includes B-mode imaging, spectral Doppler, color Doppler, and power Doppler as needed of all accessible portions of each vessel. Bilateral testing is considered an integral part of a complete examination. Limited examinations for reoccurring indications may be performed as noted.  Right Findings: +----------+------------+---------+-----------+----------+---------------------+ RIGHT     CompressiblePhasicitySpontaneousProperties       Summary        +----------+------------+---------+-----------+----------+---------------------+   IJV                                                    Not visualized     +----------+------------+---------+-----------+----------+---------------------+ Subclavian    Full       Yes       Yes                                    +----------+------------+---------+-----------+----------+---------------------+ Axillary      Full       Yes       Yes                                    +----------+------------+---------+-----------+----------+---------------------+ Brachial                 Yes       Yes               unable to compress                                                        due to patient pain                                                            tolerance       +----------+------------+---------+-----------+----------+---------------------+ Radial        Full                                                        +----------+------------+---------+-----------+----------+---------------------+ Ulnar         Full                                                        +----------+------------+---------+-----------+----------+---------------------+ Cephalic      None                                     Age Indeterminate   +----------+------------+---------+-----------+----------+---------------------+ Basilic                  Yes       Yes               unable to compress                                                          due to patient pain                                                            tolerance       +----------+------------+---------+-----------+----------+---------------------+  Summary:  Right: No evidence of deep vein thrombosis in the upper extremity. Findings consistent with age indeterminate superficial vein thrombosis involving the right cephalic vein.  *See table(s) above for measurements and observations.  Diagnosing physician: Deitra Mayo MD Electronically signed by Deitra Mayo MD on 12/06/2019 at 5:20:39 PM.    Final     Scheduled Meds: . amiodarone  400 mg Oral BID  . apixaban  2.5 mg Oral BID  . B-complex with vitamin C  1 tablet Per Tube Daily  . chlorhexidine gluconate (MEDLINE KIT)  15 mL Mouth Rinse BID  . Chlorhexidine Gluconate Cloth  6 each Topical Q0600  . Chlorhexidine Gluconate Cloth  6 each Topical Q0600  . darbepoetin (ARANESP) injection - DIALYSIS  100 mcg Intravenous Q Tue-HD  . docusate  100 mg Per Tube Daily  . feeding supplement (PRO-STAT SUGAR FREE 64)  30 mL Per Tube TID  . insulin aspart  0-9 Units Subcutaneous Q4H  . mouth rinse  15 mL Mouth Rinse 10 times per day  . midodrine  10 mg Per Tube Q8H  . pantoprazole sodium  40 mg Per Tube QHS  . sodium chloride flush  10-40 mL Intracatheter Q12H  . white petrolatum   Topical Daily   Continuous Infusions: . sodium chloride Stopped (12/01/19 1424)  . feeding supplement (VITAL 1.5 CAL) 1,000 mL (12/07/19 4098)    Active Problems:   Acute on chronic systolic CHF (congestive heart failure) (HCC)   Nonischemic cardiomyopathy (HCC)   Cardiac arrest (HCC)   Paroxysmal atrial fibrillation (HCC)   Acute respiratory failure (HCC)   ESRD  on hemodialysis ([place])   Pressure injury of skin    Time spent: >25 minutes     Kinnie Feil  [facility] Pager [phone]. If 7PM-7AM, please contact night-coverage at [email or web address], password [facility] 12/07/2019, 9:54 AM  LOS: 21 days

## 2019-12-07 NOTE — Progress Notes (Signed)
NAME:  [name] [name], MRN:  [id number], DOB:  October 26, 1966, LOS: 63 ADMISSION DATE:  11/16/2019, CONSULTATION DATE:  12/2 REFERRING MD:  Dr. Sedonia Small, CHIEF COMPLAINT:  Post cardiac arrest    Brief History   53yo male presented post PEA arrest which occurred during iHD treatment as outpatient. This was a witnessed arrest with quick bystander CPR. Received 3 rounds of epi, 1g of calcium and 50 mEq of bicarb with ROSC after 15 minutes.   Emergent cricothyrotomy performed by EMS due to failed intubation with [facility] airway.   Past Medical History  NSVT 2017 Nonischemic cardiomyopathy Medical noncompliance Polysubstance abuse Hypertension End-stage renal disease Combined systolic and diastolic congestive heart failure Cocaine abuse Alcohol abuse Prolonged QTC  Significant Hospital Events   12/2 Admitted post cardiac arrest, emergent cric, 33 C TTM 12/8 V. tach arrest 12/10 Cric changed to tracheostomy 12/16 HD, tolerated only 30 minutes of trach collar  12/18 - He is critically ill, remains on vent. Soft blood pressure overnight but off pressors Afebrile. Rx HD  12/19 -  MAP > 65 with slow SBP. Doing PSV via trach. EP wants amio gtt cntinued through 12/21 and then decide on Dc. RN concerned about bilsters on feet and knees. She has called wound care. Not on sedation gtt. Following commands/. No active bleeding. Diarrhea + per RN   Consults:  Nephrology Electrophysiology ENT [place] 12/18   Procedures:  12/10-trach 12/4 RT fem HD cath >> out 12/2 RT  CVL >>  Significant Diagnostic Tests:  CTA 12/2-segmental filling defects in the right middle lobe and right lower lobe pulmonary arteries, pneumomediastinum, subcu emphysema CT head 12/8-no acute abnormality  Echocardiogram 11/17/19-LVEF less than 20%, normal LV systolic function    Micro Data:  COVID 12/2 > negative Aspirate 12/04+ for Haemophilus influenza  12/11 Bc >> ng  Antimicrobials:  Unasyn 12/4>>12/7 Ceftriazone  12/7>>12/13  Interim history/subjective:    12/07/2019 -called by RN for increased anxiety when attempted to come off vent on ATC. Pt with increased wob on atc, getting dialysis at this time.   12/20: VVS recommends continued monitoring of LE and allowed necrosis to demarcate. Patient reportedly opposed to idea of amputaiton. Failed ATC and back on full vent. Amio gtt cotinues.  Afebrile.  Renam planning repeat HD v CRRT with pressor support 12/08/19 based on BP. Denies complaints other than LE pain. On TF  12/06/2019 complains of any pain with manipulation of lower extremities.  Currently on full mechanical ventilatory support.  No acute distress  12/23 did not tolerate wean well  Objective   Blood pressure 121/76, pulse 89, temperature 98.2 F (36.8 C), temperature source Oral, resp. rate (!) 21, height 5' 10.5" (1.791 m), weight 79 kg, SpO2 100 %.    Vent Mode: PSV;CPAP FiO2 (%):  [40 %-60 %] 40 % Set Rate:  [20 bmp] 20 bmp Vt Set:  [590 mL] 590 mL PEEP:  [5 cmH20] 5 cmH20 Pressure Support:  [10 cmH20] 10 cmH20 Plateau Pressure:  [23 cmH20] 23 cmH20   Intake/Output Summary (Last 24 hours) at 12/07/2019 0915 Last data filed at 12/07/2019 0800 Gross per 24 hour  Intake 1339.58 ml  Output 100 ml  Net 1239.58 ml   Filed Weights   12/05/19 1239 12/06/19 0500 12/07/19 0500  Weight: 80.2 kg 78.7 kg 79 kg      Recent Labs    12/06/19 0218 12/07/19 0246  HGB 7.2* 7.8*   Examination:  General: Acute on chronically ill appearing male, NAD  HEENT: [place]/AT, PERRL, EOM-I and MMM, trach is in a good position Neuro: Alert and interactive, moving all ext to command CV: IRIR, Nl S1/S2 and -M/R/G PULM: Coarse diffusely GI: Soft, NT, ND and +BS Extremities: warm/dry,  edema  Skin: Intact  I reviewed CXR myself, trach is in a good position  Discussed with [facility] Problem list     Assessment & Plan:  # Cardiac arrest- doubt PE but contrast timing poor,  venous duplex negative  Vfib rhythm,  Combined systolic and diastolic CHF, NSVT, prolonged QT Plan Transition to p.o. amiodarone Not a candidate for internal cardiac defibrillator per EP Tele monitoring  Afib:  Now on amiodarone p.o. Not on heparin due to low hemoglobin Consider subcu heparin Restart NOAC  # Shock - cardiogenic -resolved Plan Monitor  #Acute on chronic respiratory failure, difficult intubation needing cric- s/p  Trach - -Goal is to progress to trach collar,  Hopefully will make more progress after dialysis Plan Attempt PS and if tolerated then may consider TC  # subsegmental PE:  Restart   #anxiety:  As needed Xanax  # ESRD-intermittent HD per renal, last 12/16 and 12/18 Plan Per renal services  # Hx polysustance abuse- cocaine, alcohol, tobacco Plan As needed fentanyl  # Ileus-resolved as documented by KUB  #Anemia normocytic:  Recent Labs    12/06/19 0218 12/07/19 0246  HGB 7.2* 7.8*   #Ischemic changes of feet -due to low flow, obtain vascular input, now off IV heparin. Seen by VVS 12/18 - VVS consult concerned that patient iat risk for amputation of lower extremity and blisters are signaling ischemia. Recommending supportive care and await demarcation. Patient not interested in amputation Plan  Wound care Vascular vein surgery is following  LABS    PULMONARY No results for input(s): PHART, PCO2ART, PO2ART, HCO3, TCO2, O2SAT in the last 168 hours.  Invalid input(s): PCO2, PO2  CBC Recent Labs  Lab 12/05/19 0215 12/06/19 0218 12/07/19 0246  HGB 8.1* 7.2* 7.8*  HCT 24.8* 22.8* 25.0*  WBC 14.7* 10.9* 12.3*  PLT 476* 417* 415*    COAGULATION No results for input(s): INR in the last 168 hours.  CARDIAC  No results for input(s): TROPONINI in the last 168 hours. No results for input(s): PROBNP in the last 168 hours.   CHEMISTRY Recent Labs  Lab 11/30/19 2236 12/03/19 0418 12/04/19 0322 12/05/19 0215 12/06/19 0218  12/07/19 0246  NA 135 134* 132* 131* 134* 130*  131*  K 3.3* 3.7 4.3 5.1 4.5 5.5*  5.4*  CL 99 94* 92* 92* 93* 90*  90*  CO2 25 25 24 24 25 24  24   GLUCOSE 106* 118* 113* 116* 112* 95  97  BUN 31* 39* 65* 85* 47* 72*  73*  CREATININE 2.74* 4.07* 5.37* 6.65* 4.52* 5.80*  5.85*  CALCIUM 7.5* 7.7* 7.4* 7.4* 7.8* 7.8*  7.8*  MG 2.0  --   --   --  1.9 2.1  PHOS  --  2.0* 2.5 2.8 2.3* 2.4*  2.4*   Estimated Creatinine Clearance: 15.7 mL/min (A) (by C-G formula based on SCr of 5.85 mg/dL (H)).   LIVER Recent Labs  Lab 12/03/19 0418 12/04/19 0322 12/05/19 0215 12/06/19 0218 12/07/19 0246  AST 19 16  --   --   --   ALT 21 18  --   --   --   ALKPHOS 113 114  --   --   --   BILITOT 0.8 0.9  --   --   --  PROT 6.5 6.5  --   --   --   ALBUMIN 2.2*  2.2* 2.2*  2.1* 2.2* 2.1* 2.1*     INFECTIOUS Recent Labs  Lab 12/04/19 0322  LATICACIDVEN 1.5     ENDOCRINE CBG (last 3)  Recent Labs    12/07/19 0003 12/07/19 0437 12/07/19 0719  GLUCAP 99 81 92   The patient is critically ill with multiple organ systems failure and requires high complexity decision making for assessment and support, frequent evaluation and titration of therapies, application of advanced monitoring technologies and extensive interpretation of multiple databases.   Critical Care Time devoted to patient care services described in this note is  31  Minutes. This time reflects time of care of this signee Dr Jennet Maduro. This critical care time does not reflect procedure time, or teaching time or supervisory time of PA/NP/Med student/Med Resident etc but could involve care discussion time.  Rush Farmer, M.D. [facility] Pulmonary/Critical Care Medicine.

## 2019-12-07 NOTE — Progress Notes (Signed)
Patient placed back on vent on full support. Patient says he cant breathe and sats were in the high 80's. RN made aware.

## 2019-12-07 NOTE — Progress Notes (Signed)
Treatment ended 30 mins early due to intermittent runs of Vtach notified MD Carolin Sicks. Patient stable and vital stable at the end of treatment.

## 2019-12-07 NOTE — Progress Notes (Signed)
Patient placed on ATC 40% tolerating well. Vitals are stable. IC changed and trach care done. RN made aware. RT will continue to monitor.

## 2019-12-07 NOTE — Progress Notes (Signed)
Nutrition Follow-up  DOCUMENTATION CODES:   Not applicable  INTERVENTION:   Continue Tube Feeding:  Vital 1.5 at 50 ml/hr Pro-Stat 30 mL TID Provides 2100 kcals, 126 g of protein and 912 mL of free water  Continue B-complex with C; change to Rena-Vit when/if able to take po  NUTRITION DIAGNOSIS:   Inadequate oral intake related to acute illness as evidenced by NPO status.  Being addressed via TF   GOAL:   Patient will meet greater than or equal to 90% of their needs  Met  MONITOR:   Vent status, Labs, Weight trends, TF tolerance  ASSESSMENT:   53 yo male admitted post cardiac arrest at [facility], required emergent cricothyrotomy in the field. PMH includes ESRD on HD, HTN, CHF, cocaine and EtOH abuse, medical nonadherence  12/02 Admit post Cardiac Arrest, TTM 33 degrees 12/04 Gastric Cortrak placed, CRRT initiated 12/08 Arrested, CRRT to resume 12/10 Cricthothyrotomy changed to [place] 12/11 KUB with ileus pattern, CRRT d/c 12/13 Abd xray with improved ileus 12/15 Cortrak advanced to post-pyloric position  Vascular Surgery following for ischemia to BLE due to vasopressors at time of PEA arrest.   Nephrology following for intermittent HD needs.   Patient remains on ventilator support via trach MV: 8.8 L/min Temp (24hrs), Avg:98.6 F (37 C), Min:98.2 F (36.8 C), Max:99.1 F (37.3 C)  Propofol: none  Tolerating Vital 1.5 TF via Cortrak (post pyloric) at 50 ml/h.  Labs reviewed. sodium 130 (L), phosphorus 2.4 (L), potassium 5.5 (H) CBG's: 81-92 Medications reviewed and include B-complex with C.  I/O +5.9 L since admission Weight trending up over the past week  Diet Order:   Diet Order    None      EDUCATION NEEDS:   Not appropriate for education at this time  Skin:  Skin Assessment: Skin Integrity Issues: Skin Integrity Issues:: Stage II Stage II: sacrum  Last BM:  12/23 rectal tube  Height:   Ht Readings from Last 1 Encounters:  11/16/19 5'  10.5" (1.791 m)    Weight:   Wt Readings from Last 1 Encounters:  12/07/19 79 kg  12/01/19 74.3 kg  BMI:  Body mass index is 24.64 kg/m.  Estimated Nutritional Needs:   Kcal:  2118 kcals  Protein:  120-160 g  Fluid:  1000 mL plus UOP   [employer] MS, RDN, LDN, CNSC [phone] Pager  [phone] Weekend/On-Call Pager

## 2019-12-07 NOTE — Progress Notes (Signed)
   Patient ID: [name] [name], male   DOB: Oct 21, 1966, 53 y.o.   MRN: [id number]   This NP visited patient at the bedside as a follow up for palliative medicine needs and emotional support. Patient was resting comfortably in bed.  He does not have the energy to have conversation with me at this time but gives me permission to call son and set up a meeting.  Spoke with son/Marvin Sar regarding importance of conversation with patient and family to discuss current medical situation, treatment options and anticipatory care needs.  Briefly discussed patient's current medical situation, actions and concerns addressed  He agrees and plans to meet with me in the patient's room at 0900 am for [facility] meeting.  Discussed with charge nurse importance of having the patient's father present too.  She agrees, we will allow for 2 visitors to participate in this important goal setting meeting.   Discussed with bedside RN  Total time spent on the unit was 20 minutes   Greater than 50% of the time was spent in counseling and coordination of care  Wadie Lessen NP  Palliative Medicine Team Team Phone # [phone] Pager [phone]

## 2019-12-08 DIAGNOSIS — Z978 Presence of other specified devices: Secondary | ICD-10-CM

## 2019-12-08 DIAGNOSIS — J9690 Respiratory failure, unspecified, unspecified whether with hypoxia or hypercapnia: Secondary | ICD-10-CM

## 2019-12-08 DIAGNOSIS — Z930 Tracheostomy status: Secondary | ICD-10-CM

## 2019-12-08 LAB — CBC WITH DIFFERENTIAL/PLATELET
Abs Immature Granulocytes: 0.12 10*3/uL — ABNORMAL HIGH (ref 0.00–0.07)
Basophils Absolute: 0.1 10*3/uL (ref 0.0–0.1)
Basophils Relative: 1 %
Eosinophils Absolute: 0.2 10*3/uL (ref 0.0–0.5)
Eosinophils Relative: 1 %
HCT: 22.4 % — ABNORMAL LOW (ref 39.0–52.0)
Hemoglobin: 7.1 g/dL — ABNORMAL LOW (ref 13.0–17.0)
Immature Granulocytes: 1 %
Lymphocytes Relative: 9 %
Lymphs Abs: 1 10*3/uL (ref 0.7–4.0)
MCH: 29 pg (ref 26.0–34.0)
MCHC: 31.7 g/dL (ref 30.0–36.0)
MCV: 91.4 fL (ref 80.0–100.0)
Monocytes Absolute: 1.2 10*3/uL — ABNORMAL HIGH (ref 0.1–1.0)
Monocytes Relative: 11 %
Neutro Abs: 8.3 10*3/uL — ABNORMAL HIGH (ref 1.7–7.7)
Neutrophils Relative %: 77 %
Platelets: 393 10*3/uL (ref 150–400)
RBC: 2.45 MIL/uL — ABNORMAL LOW (ref 4.22–5.81)
RDW: 17.6 % — ABNORMAL HIGH (ref 11.5–15.5)
WBC: 10.8 10*3/uL — ABNORMAL HIGH (ref 4.0–10.5)
nRBC: 0.2 % (ref 0.0–0.2)

## 2019-12-08 LAB — RENAL FUNCTION PANEL
Albumin: 2.3 g/dL — ABNORMAL LOW (ref 3.5–5.0)
Anion gap: 16 — ABNORMAL HIGH (ref 5–15)
BUN: 48 mg/dL — ABNORMAL HIGH (ref 6–20)
CO2: 25 mmol/L (ref 22–32)
Calcium: 8 mg/dL — ABNORMAL LOW (ref 8.9–10.3)
Chloride: 91 mmol/L — ABNORMAL LOW (ref 98–111)
Creatinine, Ser: 4.24 mg/dL — ABNORMAL HIGH (ref 0.61–1.24)
GFR calc Af Amer: 18 mL/min — ABNORMAL LOW (ref >60)
GFR calc non Af Amer: 15 mL/min — ABNORMAL LOW (ref >60)
Glucose, Bld: 108 mg/dL — ABNORMAL HIGH (ref 70–99)
Phosphorus: 1.8 mg/dL — ABNORMAL LOW (ref 2.5–4.6)
Potassium: 4.6 mmol/L (ref 3.5–5.1)
Sodium: 132 mmol/L — ABNORMAL LOW (ref 135–145)

## 2019-12-08 LAB — GLUCOSE, CAPILLARY
Glucose-Capillary: 92 mg/dL (ref 70–99)
Glucose-Capillary: 104 mg/dL — ABNORMAL HIGH (ref 70–99)
Glucose-Capillary: 113 mg/dL — ABNORMAL HIGH (ref 70–99)
Glucose-Capillary: 100 mg/dL — ABNORMAL HIGH (ref 70–99)
Glucose-Capillary: 85 mg/dL (ref 70–99)
Glucose-Capillary: 99 mg/dL (ref 70–99)

## 2019-12-08 LAB — MAGNESIUM: Magnesium: 2 mg/dL (ref 1.7–2.4)

## 2019-12-08 MED ORDER — POTASSIUM & SODIUM PHOSPHATES 280-160-250 MG PO PACK
2.0000 | PACK | Freq: Once | ORAL | Status: AC
  Administered 2019-12-08: 2 via ORAL
  Filled 2019-12-08: qty 2

## 2019-12-08 MED ORDER — SODIUM PHOSPHATES 45 MMOLE/15ML IV SOLN
20.0000 mmol | Freq: Once | INTRAVENOUS | Status: DC
  Filled 2019-12-08: qty 6.67

## 2019-12-08 MED ORDER — CHLORHEXIDINE GLUCONATE CLOTH 2 % EX PADS: 6.0000 | MEDICATED_PAD | Freq: Every day | CUTANEOUS | Status: DC

## 2019-12-08 NOTE — Progress Notes (Signed)
Physical Therapy Treatment Patient Details Name: [name] [name]: [id number] DOB: 09-28-1966 Today's Date: 12/08/2019    History of Present Illness 53yo male presented post PEA arrest which occurred during iHD treatment as outpatient. This was a witnessed arrest with quick bystander CPR. Emergent cricothyrotomy performed by EMS due to failed intubation with [facility] airway. 11/18/19 CRRT initiated; 12/8 v tach arrest; 12/10 crich changed to tracheostomy; 12/16 HD;  lower extremity vascular ischemia secondary to cardiac arrest and need for pressors. Vascular consulted and awaiting demarcation with likely need for amputations. PMH significant for polysubstance abuse, HTN, ESRD, CHF, nonischemic cardiomyopathy. Trach placed on 12/10.    PT Comments    Patient fatigued from being up to EOB x 30 minutes with nursing only 45 minutes prior to PT arrival. He was agreeable to work with PT on getting OOB. He was able to stand x 2 from EOB with min assist of 2 (primarily for lines). Pivotal steps to recliner stronger today with less UE support via bil HHA. (of note, offered to get RW for pt to use for incr security and pt denied need for RW). Continues to make progress.    Follow Up Recommendations  CIR;Supervision/Assistance - 24 hour     Equipment Recommendations  (defer to post-acute setting)    Recommendations for Other Services       Precautions / Restrictions Precautions Precautions: Fall Precaution Comments: Ventilated, rectal tube, feeding tube Restrictions Weight Bearing Restrictions: No    Mobility  Bed Mobility Overal bed mobility: Needs Assistance Bed Mobility: Rolling;Sidelying to Sit Rolling: Mod assist;+2 for safety/equipment Sidelying to sit: +2 for safety/equipment;HOB elevated;Min assist       General bed mobility comments: vc for technique; pt reports chest pain from CPR when trying to reach across to the rail to roll  Transfers Overall transfer level: Needs  assistance Equipment used: 2 person hand held assist Transfers: Sit to/from Stand Sit to Stand: +2 safety/equipment;Min assist;From elevated surface         General transfer comment: stood x 2; RN in to look at his bottom and stood x 75 seconds  Ambulation/Gait Ambulation/Gait assistance: Min assist;+2 physical assistance;+2 safety/equipment Gait Distance (Feet): 2 Feet Assistive device: 2 person hand held assist Gait Pattern/deviations: Shuffle Gait velocity: reduced   General Gait Details: short shuffling side steps to turn from bed to recliner; no buckling   Stairs             Wheelchair Mobility    Modified Rankin (Stroke Patients Only)       Balance Overall balance assessment: Needs assistance Sitting-balance support: No upper extremity supported;Feet supported Sitting balance-Leahy Scale: Fair Sitting balance - Comments: supervision EOB    Standing balance support: During functional activity;Bilateral upper extremity supported Standing balance-Leahy Scale: Poor Standing balance comment: very little support via bil UEs, even during steps to chair                            Cognition Arousal/Alertness: Awake/alert Behavior During Therapy: WFL for tasks assessed/performed Overall Cognitive Status: Difficult to assess                                 General Comments: pt on vent via trach, engages appropriately and follows commands       Exercises General Exercises - Lower Extremity Ankle Circles/Pumps: AROM;Both;5 reps Short Arc Quad: AROM;Strengthening;Both;5 reps;Supine  General Comments General comments (skin integrity, edema, etc.): RN reports that ~45 minutes prior to PT arrival pt sat at EOB with RN x 30 minutes and did some leg exercises      Pertinent Vitals/Pain Pain Assessment: Faces Faces Pain Scale: Hurts whole lot Pain Location: sacrum, bil lower legs Pain Descriptors / Indicators: Discomfort;Grimacing Pain  Intervention(s): Limited activity within patient's tolerance;Monitored during session;Repositioned    Home Living                      Prior Function            PT Goals (current goals can now be found in the care plan section) Acute Rehab PT Goals Patient Stated Goal: to get stronger PT Goal Formulation: With patient Time For Goal Achievement: 12/14/19 Potential to Achieve Goals: Fair Progress towards PT goals: Progressing toward goals    Frequency    Min 3X/week      PT Plan Current plan remains appropriate    Co-evaluation              AM-PAC PT "6 Clicks" Mobility   Outcome Measure  Help needed turning from your back to your side while in a flat bed without using bedrails?: A Little Help needed moving from lying on your back to sitting on the side of a flat bed without using bedrails?: A Lot Help needed moving to and from a bed to a chair (including a wheelchair)?: A Lot Help needed standing up from a chair using your arms (e.g., wheelchair or bedside chair)?: A Lot Help needed to walk in hospital room?: A Lot Help needed climbing 3-5 steps with a railing? : Total 6 Click Score: 12    End of Session Equipment Utilized During Treatment: Oxygen(via vent) Activity Tolerance: Patient tolerated treatment well Patient left: in chair;with call bell/phone within reach;with chair alarm set Nurse Communication: Mobility status;Other (comment)(feet on floor due to sacral and leg pain incr when up) PT Visit Diagnosis: Unsteadiness on feet (R26.81);Muscle weakness (generalized) (M62.81)     Time: 6712-4580 PT Time Calculation (min) (ACUTE ONLY): 31 min  Charges:  $Gait Training: 8-22 mins $Therapeutic Activity: 8-22 mins                      Arby Barrette, PT Pager [phone]    Rexanne Mano 12/08/2019, 12:40 PM

## 2019-12-08 NOTE — Progress Notes (Signed)
EP amiodarone recommendations If no further VT would reduce amiodarone to 400mg  daily after one week.  Tommye Standard, PA-C

## 2019-12-08 NOTE — Progress Notes (Signed)
   Patient ID: [name] [name], male   DOB: 1966-04-26, 53 y.o.   MRN: [id number]   This NP visited patient at the bedside as a follow up for palliative medicine needs and emotional support. Patient was resting comfortably in bed.  He is awake and oriented and communicates with head nods and holding words.  He is emotional and easily agitated  Discussed  with nursing possibility of using a white board to allow the patient to perhaps communicate more effectively by writing.  I then met as scheduled with the patient's father, son and daughter for conversation regarding current medical situation; diagnosis, prognosis, goals of care, treatment option decisions, advanced directive decisions and anticipatory care needs.  We discussed his acute on chronic respiratory failure/patient is currently vent dependent, his end-stage renal disease/ dialysis dependent for 5 years, his cardiac arrest and ischemic changes of his feet. We discussed concept specific to human mortality and the limitations of medical interventions when the body fails to thrive.   Most importantly we explored [name] [name] values and goals of care important to him as a person.  I detailed the difference between an aggressive medical intervention path and a palliative comfort path for this patient at this time in this situation.  All family members verbalized an understanding of the seriousness of the current medical situation.  Patient and family verbalize their openness to all offered and available medical interventions to prolong life at this time.  Discussed with patient/family  the importance of continued conversation with each other  and the  medical providers regarding overall plan of care and treatment options,  ensuring decisions are within the context of the patients values and GOCs.     Family verbalized appreciation for today's conversation.  Discussed with bedside RN and Dr Sheppard Coil  Patient does not have a documented  healthcare power of attorney however patient's father, son and daughter all present today for this meeting will act in unison in the best interest of the patient if at any time he is unable to communicate his wishes himself.  Total time spent on the unit was 60 minutes   Greater than 50% of the time was spent in counseling and coordination of care  Wadie Lessen NP  Palliative Medicine Team Team Phone # [phone] Pager [phone]  This nurse practitioner informed  the patient/family and the attending that I will be out of the hospital until Monday morning.  I will follow-up at that time.

## 2019-12-08 NOTE — Progress Notes (Signed)
NAME:  [name] [name], MRN:  [id number], DOB:  Apr 22, 1966, LOS: 10 ADMISSION DATE:  11/16/2019, CONSULTATION DATE:  12/2 REFERRING MD:  Dr. Sedonia Small, CHIEF COMPLAINT:  Post cardiac arrest    Brief History   53yo male presented post PEA arrest which occurred during iHD treatment as outpatient. This was a witnessed arrest with quick bystander CPR. Received 3 rounds of epi, 1g of calcium and 50 mEq of bicarb with ROSC after 15 minutes.   Emergent cricothyrotomy performed by EMS due to failed intubation with [facility] airway.   Past Medical History  NSVT 2017 Nonischemic cardiomyopathy Medical noncompliance Polysubstance abuse Hypertension End-stage renal disease Combined systolic and diastolic congestive heart failure Cocaine abuse Alcohol abuse Prolonged QTC  Significant Hospital Events   12/2 Admitted post cardiac arrest, emergent cric, 33 C TTM 12/8 V. tach arrest 12/10 Cric changed to tracheostomy 12/16 HD, tolerated only 30 minutes of trach collar  12/18 - He is critically ill, remains on vent. Soft blood pressure overnight but off pressors Afebrile. Rx HD  12/19 -  MAP > 65 with slow SBP. Doing PSV via trach. EP wants amio gtt cntinued through 12/21 and then decide on Dc. RN concerned about bilsters on feet and knees. She has called wound care. Not on sedation gtt. Following commands/. No active bleeding. Diarrhea + per RN   Consults:  Nephrology Electrophysiology ENT [place] 12/18   Procedures:  12/10-trach 12/4 RT fem HD cath >> out 12/2 RT [place] CVL >>  Significant Diagnostic Tests:  CTA 12/2-segmental filling defects in the right middle lobe and right lower lobe pulmonary arteries, pneumomediastinum, subcu emphysema CT head 12/8-no acute abnormality  Echocardiogram 11/17/19-LVEF less than 20%, normal LV systolic function    Micro Data:  COVID 12/2 > negative Aspirate 12/04+ for Haemophilus influenza  12/11 Bc >> ng  Antimicrobials:  Unasyn 12/4>>12/7 Ceftriazone  12/7>>12/13  Interim history/subjective:    12/08/2019 -called by RN for increased anxiety when attempted to come off vent on ATC. Pt with increased wob on atc, getting dialysis at this time.   12/20: VVS recommends continued monitoring of LE and allowed necrosis to demarcate. Patient reportedly opposed to idea of amputaiton. Failed ATC and back on full vent. Amio gtt cotinues.  Afebrile.  Renam planning repeat HD v CRRT with pressor support 12/09/19 based on BP. Denies complaints other than LE pain. On TF  12/06/2019 complains of any pain with manipulation of lower extremities.  Currently on full mechanical ventilatory support.  No acute distress  12/23 did not tolerate wean well  12/24 Not tolerating wean, no events overnight  Objective   Blood pressure 107/72, pulse 97, temperature 99.4 F (37.4 C), temperature source Oral, resp. rate (!) 24, height 5' 10.5" (1.791 m), weight 81 kg, SpO2 99 %.    Vent Mode: PRVC FiO2 (%):  [40 %] 40 % Set Rate:  [20 bmp] 20 bmp Vt Set:  [590 mL] 590 mL PEEP:  [5 cmH20] 5 cmH20 Plateau Pressure:  [20 cmH20-23 cmH20] 20 cmH20   Intake/Output Summary (Last 24 hours) at 12/08/2019 0953 Last data filed at 12/08/2019 0900 Gross per 24 hour  Intake 1740 ml  Output 1816 ml  Net -76 ml   Filed Weights   12/07/19 0500 12/07/19 1743 12/08/19 0500  Weight: 79 kg 80.3 kg 81 kg      Recent Labs    12/07/19 0246 12/08/19 0537  HGB 7.8* 7.1*   Examination:  General: Acute on chronically ill appearing male,  NAD HEENT: [place]/AT, PERRL, EOM-I and MMM, trach is in a good position Neuro: Alert and interactive, moving to command but sluggish CV: IRIR, Nl S1/S2 and -M/R/G PULM: Coarse BS diffusely GI: Soft, NT, ND and +BS Extremities: warm/dry,  edema  Skin: Intact  I reviewed CXR myself, trach is in a good position  Discussed with [facility] Problem list     Assessment & Plan:  # Cardiac arrest- doubt PE but contrast timing  poor, venous duplex negative  Vfib rhythm,  Combined systolic and diastolic CHF, NSVT, prolonged QT Plan Transition to p.o. amiodarone Not a candidate for internal cardiac defibrillator per EP Continue tele monitoring Defer to cards  Afib:  Now on amiodarone p.o. Not on heparin due to low hemoglobin Consider subcu heparin Continue NOAC  # Shock - cardiogenic -resolved Plan Monitor  #Acute on chronic respiratory failure, difficult intubation needing cric- s/p  Trach - -Goal is to progress to trach collar,  Hopefully will make more progress after dialysis Plan Attempt PS again today and advance to TC as able  # subsegmental PE:  Restart   #anxiety:  As needed Xanax  # ESRD-intermittent HD per renal, last 12/16 and 12/18 Plan Per renal services  # Hx polysustance abuse- cocaine, alcohol, tobacco Plan As needed fentanyl  # Ileus-resolved as documented by K[facility]  #Anemia normocytic:  Recent Labs    12/07/19 0246 12/08/19 0537  HGB 7.8* 7.1*   #Ischemic changes of feet -due to low flow, obtain vascular input, now off IV heparin. Seen by VVS 12/18 - VVS consult concerned that patient iat risk for amputation of lower extremity and blisters are signaling ischemia. Recommending supportive care and await demarcation. Patient not interested in amputation Plan  Wound care Vascular vein surgery is following  Discussed with bedside RN and RT  Conversation with the family, they wish to continue full medical care for now as they are seeing signs of improvement.  Will continue conversations regarding plan of care.  LABS    PULMONARY No results for input(s): PHART, PCO2ART, PO2ART, HCO3, TCO2, O2SAT in the last 168 hours.  Invalid input(s): PCO2, PO2  CBC Recent Labs  Lab 12/06/19 0218 12/07/19 0246 12/08/19 0537  HGB 7.2* 7.8* 7.1*  HCT 22.8* 25.0* 22.4*  WBC 10.9* 12.3* 10.8*  PLT 417* 415* 393    COAGULATION No results for input(s): INR in the last 168  hours.  CARDIAC  No results for input(s): TROPONINI in the last 168 hours. No results for input(s): PROBNP in the last 168 hours.   CHEMISTRY Recent Labs  Lab 12/04/19 0322 12/05/19 0215 12/06/19 0218 12/07/19 0246 12/08/19 0537  NA 132* 131* 134* 130*  131* 132*  K 4.3 5.1 4.5 5.5*  5.4* 4.6  CL 92* 92* 93* 90*  90* 91*  CO2 24 24 25 24  24 25   GLUCOSE 113* 116* 112* 95  97 108*  BUN 65* 85* 47* 72*  73* 48*  CREATININE 5.37* 6.65* 4.52* 5.80*  5.85* 4.24*  CALCIUM 7.4* 7.4* 7.8* 7.8*  7.8* 8.0*  MG  --   --  1.9 2.1 2.0  PHOS 2.5 2.8 2.3* 2.4*  2.4* 1.8*   Estimated Creatinine Clearance: 21.6 mL/min (A) (by C-G formula based on SCr of 4.24 mg/dL (H)).   LIVER Recent Labs  Lab 12/03/19 0418 12/04/19 0322 12/05/19 0215 12/06/19 0218 12/07/19 0246 12/08/19 0537  AST 19 16  --   --   --   --  ALT 21 18  --   --   --   --   ALKPHOS 113 114  --   --   --   --   BILITOT 0.8 0.9  --   --   --   --   PROT 6.5 6.5  --   --   --   --   ALBUMIN 2.2*  2.2* 2.2*  2.1* 2.2* 2.1* 2.1* 2.3*     INFECTIOUS Recent Labs  Lab 12/04/19 0322  LATICACIDVEN 1.5     ENDOCRINE CBG (last 3)  Recent Labs    12/07/19 2306 12/08/19 0309 12/08/19 0717  GLUCAP 100* 92 104*   The patient is critically ill with multiple organ systems failure and requires high complexity decision making for assessment and support, frequent evaluation and titration of therapies, application of advanced monitoring technologies and extensive interpretation of multiple databases.   Critical Care Time devoted to patient care services described in this note is  31  Minutes. This time reflects time of care of this signee Dr Jennet Maduro. This critical care time does not reflect procedure time, or teaching time or supervisory time of PA/NP/Med student/Med Resident etc but could involve care discussion time.  Rush Farmer, M.D. [facility] Pulmonary/Critical Care Medicine.

## 2019-12-08 NOTE — Progress Notes (Signed)
[facility] ROUNDING NOTE   Subjective:   This 53 year old male status post PEA arrest occurred during hemodialysis treatments outpatient 11/16/2019.Marland Kitchen  This was a witnessed arrest with bystander CPR.  Emergent cricothyroidotomy performed by EMS.  Due to failed intubation with [facility] airway.  Does have a past medical history that is pertinent for polysubstance hypertension stage renal disease ischemic cardiomyopathy-2D echo 11/17/2019 showed ejection fraction less than 20%.   CRRT was initiated 11/18/2019.  Status post intermittent hemodialysis initiated on 12/02/2019.  On 12/21 he underwent dialysis and tolerated 1.4 L removed.  He is scheduled for dialysis 12/07/2019.  Underwent dialysis 12/07/2019 with 1.5 L removed session was terminated due to runs of V. tach.  Next dialysis will be 12/10/2019  Hospital course has been complicated by lower extremity vascular ischemia secondary to cardiac arrest and need for pressors.  He was evaluated by Dr. Annamarie Major.  Appreciate the assistance. Met with palliative medicine 12/07/2019 appreciate assistance from Ms. Wadie Lessen.  Ongoing discussions.  Blood pressure 119/72 pulse 100 temperature 99.4 O2 sats at 99% FiO2 40%  Sodium 132 potassium 4.6 chloride 91 CO2 25 BUN 48 creatinine 4.24 glucose 108 calcium 8 phosphorus 1.8 magnesium 2.0 albumin 2.3 WBC 10.8 hemoglobin 7.1 platelets 393  Amiodarone 400 mg twice daily apixaban 2.5 mg twice daily, insulin sliding scale, Protonix 40 mg nightly     Objective:  Vital signs in last 24 hours:  Temp:  [97.9 F (36.6 C)-100 F (37.8 C)] 99.4 F (37.4 C) (12/24 0718) Pulse Rate:  [71-97] 97 (12/24 0900) Resp:  [16-27] 24 (12/24 0900) BP: (83-145)/(45-97) 107/72 (12/24 0900) SpO2:  [80 %-100 %] 99 % (12/24 0900) FiO2 (%):  [40 %] 40 % (12/24 0800) Weight:  [80.3 kg-81 kg] 81 kg (12/24 0500)  Weight change: 1.3 kg Filed Weights   12/07/19 0500 12/07/19 1743 12/08/19 0500  Weight: 79 kg  80.3 kg 81 kg    Intake/Output: I/O last 3 completed shifts: In: 2340 [NG/GT:2340] Out: 1816 [Other:1516; Stool:300]   Intake/Output this shift:  Total I/O In: 200 [NG/GT:200] Out: -   Gen: sitting up on edge of bed on vent via trach CVS: no rub Resp: scattered rhonchi Abd:+BS, soft, NT/Nd Ext: +ischemic changes to bilateral feet with edema, LUE AVF +T/B  Basic Metabolic Panel: Recent Labs  Lab 12/04/19 0322 12/05/19 0215 12/06/19 0218 12/07/19 0246 12/08/19 0537  NA 132* 131* 134* 130*  131* 132*  K 4.3 5.1 4.5 5.5*  5.4* 4.6  CL 92* 92* 93* 90*  90* 91*  CO2 _0 GLUCOSE 113* 116* 112* 95  97 108*  BUN 65* 85* 47* 72*  73* 48*  CREATININE 5.37* 6.65* 4.52* 5.80*  5.85* 4.24*  CALCIUM 7.4* 7.4* 7.8* 7.8*  7.8* 8.0*  MG  --   --  1.9 2.1 2.0  PHOS 2.5 2.8 2.3* 2.4*  2.4* 1.8*    Liver Function Tests: Recent Labs  Lab 12/03/19 0418 12/04/19 0322 12/05/19 0215 12/06/19 0218 12/07/19 0246 12/08/19 0537  AST 19 16  --   --   --   --   ALT 21 18  --   --   --   --   ALKPHOS 113 114  --   --   --   --   BILITOT 0.8 0.9  --   --   --   --   PROT 6.5 6.5  --   --   --   --  ALBUMIN 2.2*  2.2* 2.2*  2.1* 2.2* 2.1* 2.1* 2.3*   No results for input(s): LIPASE, AMYLASE in the last 168 hours. No results for input(s): AMMONIA in the last 168 hours.  CBC: Recent Labs  Lab 12/04/19 0322 12/05/19 0215 12/06/19 0218 12/07/19 0246 12/08/19 0537  WBC 13.7* 14.7* 10.9* 12.3* 10.8*  NEUTROABS 10.7* 11.1* 8.3* 9.3* 8.3*  HGB 7.6* 8.1* 7.2* 7.8* 7.1*  HCT 23.1* 24.8* 22.8* 25.0* 22.4*  MCV 89.5 89.5 91.2 91.9 91.4  PLT 457* 476* 417* 415* 393    Cardiac Enzymes: No results for input(s): CKTOTAL, CKMB, CKMBINDEX, TROPONINI in the last 168 hours.  BNP: Invalid input(s): POCBNP  CBG: Recent Labs  Lab 12/07/19 1520 12/07/19 1926 12/07/19 2306 12/08/19 0309 12/08/19 0717  GLUCAP 85 79 100* 92 104*    Microbiology: Results for  orders placed or performed during the hospital encounter of 11/16/19  SARS Coronavirus 2 by RT PCR (hospital order, performed in [facility] hospital lab) Nasopharyngeal Nasopharyngeal Swab     Status: None   Collection Time: 11/16/19 11:41 AM   Specimen: Nasopharyngeal Swab  Result Value Ref Range Status   SARS Coronavirus 2 NEGATIVE NEGATIVE Final    Comment: (NOTE) SARS-CoV-2 target nucleic acids are NOT DETECTED. The SARS-CoV-2 RNA is generally detectable in upper and lower respiratory specimens during the acute phase of infection. The lowest concentration of SARS-CoV-2 viral copies this assay can detect is 250 copies / mL. A negative result does not preclude SARS-CoV-2 infection and should not be used as the sole basis for treatment or other patient management decisions.  A negative result may occur with improper specimen collection / handling, submission of specimen other than nasopharyngeal swab, presence of viral mutation(s) within the areas targeted by this assay, and inadequate number of viral copies (<250 copies / mL). A negative result must be combined with clinical observations, patient history, and epidemiological information. Fact Sheet for Patients:   StrictlyIdeas.no Fact Sheet for Healthcare Providers: BankingDealers.co.za This test is not yet approved or cleared  by the [place] FDA and has been authorized for detection and/or diagnosis of SARS-CoV-2 by FDA under an Emergency Use Authorization (EUA).  This EUA will remain in effect (meaning this test can be used) for the duration of the COVID-19 declaration under Section 564(b)(1) of the Act, 21 U.S.C. section 360bbb-3(b)(1), unless the authorization is terminated or revoked sooner. Performed at [facility], [place] [address] [place], [place] [address]   Culture, blood (routine x 2)     Status: None   Collection Time: 11/16/19  6:03 PM   Specimen: BLOOD  RIGHT HAND  Result Value Ref Range Status   Specimen Description BLOOD RIGHT HAND  Final   Special Requests   Final    BOTTLES DRAWN AEROBIC AND ANAEROBIC Blood Culture results may not be optimal due to an inadequate volume of blood received in culture bottles   Culture   Final    NO GROWTH 5 DAYS Performed at [facility], [place] [address] [place], [place] [address]    Report Status 11/21/2019 FINAL  Final  MRSA PCR Screening     Status: None   Collection Time: 11/16/19  6:05 PM   Specimen: Nasal Mucosa; Nasopharyngeal  Result Value Ref Range Status   MRSA by PCR NEGATIVE NEGATIVE Final    Comment:        The GeneXpert MRSA Assay (FDA approved for NASAL specimens only), is one component of a  comprehensive MRSA colonization surveillance program. It is not intended to diagnose MRSA infection nor to guide or monitor treatment for MRSA infections. Performed at [facility], [place] [address], [place], [place] [address]   Culture, blood (routine x 2)     Status: None   Collection Time: 11/16/19  6:08 PM   Specimen: BLOOD RIGHT HAND  Result Value Ref Range Status   Specimen Description BLOOD RIGHT HAND  Final   Special Requests   Final    BOTTLES DRAWN AEROBIC ONLY Blood Culture results may not be optimal due to an inadequate volume of blood received in culture bottles   Culture   Final    NO GROWTH 5 DAYS Performed at [facility], [place] [address], [place], [place] [address]    Report Status 11/21/2019 FINAL  Final  Culture, respiratory (non-expectorated)     Status: None   Collection Time: 11/18/19 12:28 PM   Specimen: Tracheal Aspirate; Respiratory  Result Value Ref Range Status   Specimen Description TRACHEAL ASPIRATE  Final   Special Requests NONE  Final   Gram Stain   Final    ABUNDANT WBC PRESENT,BOTH PMN AND MONONUCLEAR ABUNDANT GRAM POSITIVE COCCI ABUNDANT GRAM NEGATIVE RODS FEW GRAM VARIABLE ROD    Culture   Final    ABUNDANT HAEMOPHILUS  INFLUENZAE BETA LACTAMASE POSITIVE Performed at [facility], [place] [address] [place], [place] [address]    Report Status 11/20/2019 FINAL  Final  Surgical pcr screen     Status: Abnormal   Collection Time: 11/24/19  7:53 AM   Specimen: Nasal Mucosa; Nasal Swab  Result Value Ref Range Status   MRSA, PCR NEGATIVE NEGATIVE Final   Staphylococcus aureus POSITIVE (A) NEGATIVE Final    Comment: (NOTE) The Xpert SA Assay (FDA approved for NASAL specimens in patients 22 years of age and older), is one component of a comprehensive surveillance program. It is not intended to diagnose infection nor to guide or monitor treatment. Performed at [facility], [place] [address], [place], [place] [address]   Culture, blood (routine x 2)     Status: None   Collection Time: 11/25/19  3:30 PM   Specimen: BLOOD  Result Value Ref Range Status   Specimen Description BLOOD RIGHT ANTECUBITAL  Final   Special Requests   Final    BOTTLES DRAWN AEROBIC ONLY Blood Culture results may not be optimal due to an inadequate volume of blood received in culture bottles   Culture   Final    NO GROWTH 5 DAYS Performed at [facility], [place] [address], [place], [place] [address]    Report Status 11/30/2019 FINAL  Final  Culture, blood (routine x 2)     Status: None   Collection Time: 11/25/19  3:30 PM   Specimen: BLOOD RIGHT HAND  Result Value Ref Range Status   Specimen Description BLOOD RIGHT HAND  Final   Special Requests   Final    BOTTLES DRAWN AEROBIC AND ANAEROBIC Blood Culture results may not be optimal due to an inadequate volume of blood received in culture bottles   Culture   Final    NO GROWTH 5 DAYS Performed at [facility], [place] [address] [place],  [address]    Report Status 11/30/2019 FINAL  Final  Culture, respiratory (non-expectorated)     Status: None   Collection Time: 11/25/19  4:35 PM   Specimen: Tracheal Aspirate; Respiratory  Result Value  Ref Range Status    Specimen Description TRACHEAL ASPIRATE  Final   Special Requests NONE  Final   Gram Stain   Final    ABUNDANT WBC PRESENT,BOTH PMN AND MONONUCLEAR FEW GRAM POSITIVE COCCI Performed at [facility], [place] [address] [place], [place] [address]    Culture FEW STAPHYLOCOCCUS EPIDERMIDIS  Final   Report Status 11/28/2019 FINAL  Final   Organism ID, Bacteria STAPHYLOCOCCUS EPIDERMIDIS  Final      Susceptibility   Staphylococcus epidermidis - MIC*    CIPROFLOXACIN <=0.5 SENSITIVE Sensitive     ERYTHROMYCIN >=8 RESISTANT Resistant     GENTAMICIN <=0.5 SENSITIVE Sensitive     OXACILLIN >=4 RESISTANT Resistant     TETRACYCLINE <=1 SENSITIVE Sensitive     VANCOMYCIN 2 SENSITIVE Sensitive     TRIMETH/SULFA <=10 SENSITIVE Sensitive     CLINDAMYCIN >=8 RESISTANT Resistant     RIFAMPIN <=0.5 SENSITIVE Sensitive     Inducible Clindamycin NEGATIVE Sensitive     * FEW STAPHYLOCOCCUS EPIDERMIDIS    Coagulation Studies: No results for input(s): LABPROT, INR in the last 72 hours.  Urinalysis: No results for input(s): COLORURINE, LABSPEC, PHURINE, GLUCOSEU, HGBUR, BILIRUBINUR, KETONESUR, PROTEINUR, UROBILINOGEN, NITRITE, LEUKOCYTESUR in the last 72 hours.  Invalid input(s): APPERANCEUR    Imaging: AM DG Chest Port 1 View  Result Date: 12/07/2019 CLINICAL DATA:  Respiratory failure EXAM: PORTABLE CHEST 1 VIEW COMPARISON:  Three days ago FINDINGS: Tracheostomy tube remains well seated. The feeding tube at least reaches the stomach. Bilateral airspace disease and layering pleural fluid with cardiomegaly. No evidence of pneumothorax. IMPRESSION: Stable hardware positioning, cardiomegaly, airspace disease, and layering pleural fluid. Electronically Signed   By: Monte Fantasia M.D.   On: 12/07/2019 06:20   VAS [place] UPPER EXTREMITY VENOUS DUPLEX  Result Date: 12/06/2019 UPPER VENOUS STUDY  Indications: Swelling, and Pain Limitations: Bandages, line and patient pain tolerance. Comparison Study:  no prior Performing Technologist: Abram Sander RVS  Examination Guidelines: A complete evaluation includes B-mode imaging, spectral Doppler, color Doppler, and power Doppler as needed of all accessible portions of each vessel. Bilateral testing is considered an integral part of a complete examination. Limited examinations for reoccurring indications may be performed as noted.  Right Findings: +----------+------------+---------+-----------+----------+---------------------+ RIGHT     CompressiblePhasicitySpontaneousProperties       Summary        +----------+------------+---------+-----------+----------+---------------------+ IJV                                                    Not visualized     +----------+------------+---------+-----------+----------+---------------------+ Subclavian    Full       Yes       Yes                                    +----------+------------+---------+-----------+----------+---------------------+ Axillary      Full       Yes       Yes                                    +----------+------------+---------+-----------+----------+---------------------+ Brachial                 Yes  Yes               unable to compress                                                        due to patient pain                                                            tolerance       +----------+------------+---------+-----------+----------+---------------------+ Radial        Full                                                        +----------+------------+---------+-----------+----------+---------------------+ Ulnar         Full                                                        +----------+------------+---------+-----------+----------+---------------------+ Cephalic      None                                    Age Indeterminate   +----------+------------+---------+-----------+----------+---------------------+ Basilic                   Yes       Yes               unable to compress                                                        due to patient pain                                                            tolerance       +----------+------------+---------+-----------+----------+---------------------+  Summary:  Right: No evidence of deep vein thrombosis in the upper extremity. Findings consistent with age indeterminate superficial vein thrombosis involving the right cephalic vein.  *See table(s) above for measurements and observations.  Diagnosing physician: Deitra Mayo MD Electronically signed by Deitra Mayo MD on 12/06/2019 at 5:20:39 PM.    Final      Medications:   . sodium chloride Stopped (12/01/19 1424)  . feeding supplement (VITAL 1.5 CAL) 50 mL/hr at 12/08/19 0900   . amiodarone  400 mg Oral BID  . apixaban  2.5 mg Oral BID  . B-complex with vitamin C  1 tablet  Per Tube Daily  . chlorhexidine gluconate (MEDLINE KIT)  15 mL Mouth Rinse BID  . Chlorhexidine Gluconate Cloth  6 each Topical Q0600  . Chlorhexidine Gluconate Cloth  6 each Topical Q0600  . darbepoetin (ARANESP) injection - DIALYSIS  100 mcg Intravenous Q Wed-HD  . docusate  100 mg Per Tube Daily  . feeding supplement (PRO-STAT SUGAR FREE 64)  30 mL Per Tube TID  . insulin aspart  0-9 Units Subcutaneous Q4H  . mouth rinse  15 mL Mouth Rinse 10 times per day  . midodrine  10 mg Per Tube Q8H  . pantoprazole sodium  40 mg Per Tube QHS  . sodium chloride flush  10-40 mL Intracatheter Q12H  . white petrolatum   Topical Daily   sodium chloride, acetaminophen (TYLENOL) oral liquid 160 mg/5 mL, acetaminophen, ALPRAZolam, fentaNYL (SUBLIMAZE) injection, oxyCODONE, prochlorperazine, sodium chloride flush  Assessment/ Plan:  1. ESRD-s/p HD on 12/02/19 and had drop in BP to 62/46.BP remains soft and may need pressor support vs continued CVVHDif he cannot tolerate IHD.  He had dialysis 12/07/2019 with 1.5 L  removed but terminated early due to V. tach and is scheduled for his next dialysis treatment 12/10/2019 2. S/p PEA arrest 12/2 and VT arrest 12/8- cardiology loaded with amiodarone 400 mg twice daily and started on apixaban 2.5 mg twice daily 12/06/2019 3. ICHF- not candidate for ICD due to noncompliance and drug abuse. 4. Cardiogenic shock- off pressors   5. Anemia of CKD- s/p blood transfusion 12/01/19. Last dose of darbepoetin  12/06/2019 received 100 mcg 6. VDRF- s/p emergent cricothyroidotomy. Per PCCM 7. H/o polysubstance abuse 8. SHPTH- hold binders due to low phos and npo.Will administer 20 mEq sodium phosphate 9. Ischemic changes to lower extremities being followed by vein and vascular surgery    LOS: [place] _0 _1 :22 AM

## 2019-12-08 NOTE — Progress Notes (Signed)
[facility] PROGRESS NOTE  [name] [name] SNK:[id number] DOB: February 04, 1966 DOA: 11/16/2019 PCP: Jean Rosenthal, MD  Brief summary   53 year old man with PMH of HTN, CHF, Alcohol use, Substance use, ESRD on HD is admitted with PEA arrest at HD unit. Emergent cricothyrotomy performed by EMS due to failed intubation with [facility] airway.  Today 12/08/19 Patient is minimally verbal d/t trach in place, he denies pain but dressing around the trach is bothering him so this was loosened slightly and nurse was alerted to change dressing, pt shook head "no" when I asked if there was anything else we could do today. Palliative care is having GOC discussion w/ family today, await update. Stable otherwise.    Assessment/Plan:  Cardiac arrest. V fib/arrythmias. In the setting of combined systolic and diastolic CHF, NSVT, prolonged QT -Transition to p.o. amiodarone. Not a candidate for internal cardiac defibrillator per EP. Cont monitoring on tele   A fib: HR is controlled. On amiodarone p.o. Pt was briefly on iv heparin. Then stopped due to low hemoglobin -restarted on apixaban at 2.5 mg by PCCM on 12/23  Acute on chronic respiratory failure, difficult intubation needing cric- s/p  Trach .  -remains on vent. Cont per pulmonology   Subsegmental PE: . Pt was briefly on iv heparin. Then stopped due to low hemoglobin, -restarted on apixaban at 2.5 mg by PCCM on 12/23. may need to increase if no s/s of acute bleeds    ESRD-intermittent HD. Cont per nephrology   Hx polysustance abuse- cocaine, alcohol, tobacco. Monitor no s/s of etoh withdrawals   Ileus-resolved as documented by [facility]  Anemia normocytic. No s/s of acute bleeding. OK to continue Eliquis for now. Monitor Hg, occult stool blood test prn. Hgb low around 7-8 past few days but not looking worse.   Ischemic changes of feet -due to low flow, Seen by VVS 12/18 - VVS consult concerned that patient is at risk for amputation of lower extremity  and blisters are signaling ischemia. Recommending supportive care and await demarcation. Patient not interested in amputation   Code Status: full Family Communication: d/w patient, RN (indicate person spoken with, relationship, and if by phone, the number) Disposition Plan: remains inpatient, [employer] discussion w/ palliative care is pending    Consultants: Nephrology Electrophysiology ENT [place] 12/18   Procedures: 12/10-trach 12/4 RT fem HD cath >> out 12/2 RT [place] CVL >>  Antibiotics: Anti-infectives (From admission, onward)   Start     Dose/Rate Route Frequency Ordered Stop   11/22/19 1800  cefTRIAXone (ROCEPHIN) 2 g in sodium chloride 0.9 % 100 mL IVPB     2 g 200 mL/hr over 30 Minutes Intravenous Every 24 hours 11/22/19 0723 11/27/19 1820   11/18/19 1300  Ampicillin-Sulbactam (UNASYN) 3 g in sodium chloride 0.9 % 100 mL IVPB  Status:  Discontinued     3 g 200 mL/hr over 30 Minutes Intravenous Every 8 hours 11/18/19 1230 11/22/19 0722   11/18/19 1200  ampicillin-sulbactam (UNASYN) 1.5 g in sodium chloride 0.9 % 100 mL IVPB  Status:  Discontinued     1.5 g 200 mL/hr over 30 Minutes Intravenous Every 12 hours 11/18/19 1150 11/18/19 1230       (indicate start date, and stop date if known)  HPI/Subjective: Remains on vent. No acute distress. D/w vascular surgery regarding ischemia in LE. Pulmonology is following patient while in ICU  Objective: Vitals:   12/08/19 1200 12/08/19 1300  BP: 102/75 135/89  Pulse: (!) 107 100  Resp: (!) 34 (!) 22  Temp:    SpO2: 98% 99%    Intake/Output Summary (Last 24 hours) at 12/08/2019 1314 Last data filed at 12/08/2019 1300 Gross per 24 hour  Intake 1720 ml  Output 1816 ml  Net -96 ml   Filed Weights   12/07/19 0500 12/07/19 1743 12/08/19 0500  Weight: 79 kg 80.3 kg 81 kg    Exam:   General:  No distress   Cardiovascular: s1,s2 rrr  Respiratory: diminished at bases   Abdomen: soft, nt   Musculoskeletal: pedal edema     Data Reviewed: Basic Metabolic Panel: Recent Labs  Lab 12/04/19 0322 12/05/19 0215 12/06/19 0218 12/07/19 0246 12/08/19 0537  NA 132* 131* 134* 130*  131* 132*  K 4.3 5.1 4.5 5.5*  5.4* 4.6  CL 92* 92* 93* 90*  90* 91*  CO2 24 24 25 24  24 25   GLUCOSE 113* 116* 112* 95  97 108*  BUN 65* 85* 47* 72*  73* 48*  CREATININE 5.37* 6.65* 4.52* 5.80*  5.85* 4.24*  CALCIUM 7.4* 7.4* 7.8* 7.8*  7.8* 8.0*  MG  --   --  1.9 2.1 2.0  PHOS 2.5 2.8 2.3* 2.4*  2.4* 1.8*   Liver Function Tests: Recent Labs  Lab 12/03/19 0418 12/04/19 0322 12/05/19 0215 12/06/19 0218 12/07/19 0246 12/08/19 0537  AST 19 16  --   --   --   --   ALT 21 18  --   --   --   --   ALKPHOS 113 114  --   --   --   --   BILITOT 0.8 0.9  --   --   --   --   PROT 6.5 6.5  --   --   --   --   ALBUMIN 2.2*  2.2* 2.2*  2.1* 2.2* 2.1* 2.1* 2.3*   No results for input(s): LIPASE, AMYLASE in the last 168 hours. No results for input(s): AMMONIA in the last 168 hours. CBC: Recent Labs  Lab 12/04/19 0322 12/05/19 0215 12/06/19 0218 12/07/19 0246 12/08/19 0537  WBC 13.7* 14.7* 10.9* 12.3* 10.8*  NEUTROABS 10.7* 11.1* 8.3* 9.3* 8.3*  HGB 7.6* 8.1* 7.2* 7.8* 7.1*  HCT 23.1* 24.8* 22.8* 25.0* 22.4*  MCV 89.5 89.5 91.2 91.9 91.4  PLT 457* 476* 417* 415* 393   Cardiac Enzymes: No results for input(s): CKTOTAL, CKMB, CKMBINDEX, TROPONINI in the last 168 hours. BNP (last 3 results) Recent Labs    11/16/19 1356  BNP 962.8*    ProBNP (last 3 results) No results for input(s): PROBNP in the last 8760 hours.  CBG: Recent Labs  Lab 12/07/19 1926 12/07/19 2306 12/08/19 0309 12/08/19 0717 12/08/19 1100  GLUCAP 79 100* 92 104* 113*    No results found for this or any previous visit (from the past 240 hour(s)).   Studies: AM DG Chest Port 1 View  Result Date: 12/07/2019 CLINICAL DATA:  Respiratory failure EXAM: PORTABLE CHEST 1 VIEW COMPARISON:  Three days ago FINDINGS: Tracheostomy tube  remains well seated. The feeding tube at least reaches the stomach. Bilateral airspace disease and layering pleural fluid with cardiomegaly. No evidence of pneumothorax. IMPRESSION: Stable hardware positioning, cardiomegaly, airspace disease, and layering pleural fluid. Electronically Signed   By: Monte Fantasia M.D.   On: 12/07/2019 06:20   VAS [place] UPPER EXTREMITY VENOUS DUPLEX  Result Date: 12/06/2019 UPPER VENOUS STUDY  Indications: Swelling, and Pain Limitations: Bandages, line and patient pain tolerance.  Comparison Study: no prior Performing Technologist: Abram Sander RVS  Examination Guidelines: A complete evaluation includes B-mode imaging, spectral Doppler, color Doppler, and power Doppler as needed of all accessible portions of each vessel. Bilateral testing is considered an integral part of a complete examination. Limited examinations for reoccurring indications may be performed as noted.  Right Findings: +----------+------------+---------+-----------+----------+---------------------+ RIGHT     CompressiblePhasicitySpontaneousProperties       Summary        +----------+------------+---------+-----------+----------+---------------------+ IJV                                                    Not visualized     +----------+------------+---------+-----------+----------+---------------------+ Subclavian    Full       Yes       Yes                                    +----------+------------+---------+-----------+----------+---------------------+ Axillary      Full       Yes       Yes                                    +----------+------------+---------+-----------+----------+---------------------+ Brachial                 Yes       Yes               unable to compress                                                        due to patient pain                                                            tolerance        +----------+------------+---------+-----------+----------+---------------------+ Radial        Full                                                        +----------+------------+---------+-----------+----------+---------------------+ Ulnar         Full                                                        +----------+------------+---------+-----------+----------+---------------------+ Cephalic      None                                    Age Indeterminate   +----------+------------+---------+-----------+----------+---------------------+ Basilic  Yes       Yes               unable to compress                                                        due to patient pain                                                            tolerance       +----------+------------+---------+-----------+----------+---------------------+  Summary:  Right: No evidence of deep vein thrombosis in the upper extremity. Findings consistent with age indeterminate superficial vein thrombosis involving the right cephalic vein.  *See table(s) above for measurements and observations.  Diagnosing physician: Deitra Mayo MD Electronically signed by Deitra Mayo MD on 12/06/2019 at 5:20:39 PM.    Final     Scheduled Meds: . amiodarone  400 mg Oral BID  . apixaban  2.5 mg Oral BID  . B-complex with vitamin C  1 tablet Per Tube Daily  . chlorhexidine gluconate (MEDLINE KIT)  15 mL Mouth Rinse BID  . Chlorhexidine Gluconate Cloth  6 each Topical Q0600  . Chlorhexidine Gluconate Cloth  6 each Topical Q0600  . darbepoetin (ARANESP) injection - DIALYSIS  100 mcg Intravenous Q Wed-HD  . docusate  100 mg Per Tube Daily  . feeding supplement (PRO-STAT SUGAR FREE 64)  30 mL Per Tube TID  . insulin aspart  0-9 Units Subcutaneous Q4H  . mouth rinse  15 mL Mouth Rinse 10 times per day  . midodrine  10 mg Per Tube Q8H  . pantoprazole sodium  40 mg Per Tube QHS  . sodium  chloride flush  10-40 mL Intracatheter Q12H  . white petrolatum   Topical Daily   Continuous Infusions: . sodium chloride Stopped (12/01/19 1424)  . feeding supplement (VITAL 1.5 CAL) 50 mL/hr at 12/08/19 1300    Active Problems:   Acute on chronic systolic CHF (congestive heart failure) (HCC)   Nonischemic cardiomyopathy (HCC)   Cardiac arrest (HCC)   Paroxysmal atrial fibrillation (HCC)   Acute respiratory failure (HCC)   ESRD on hemodialysis ([place])   Pressure injury of skin   Palliative care by specialist    Time spent: >25 minutes     Emeterio Reeve  [facility]  12/08/2019, 1:14 PM  LOS: 22 days

## 2019-12-09 DIAGNOSIS — G931 Anoxic brain damage, not elsewhere classified: Secondary | ICD-10-CM

## 2019-12-09 DIAGNOSIS — J9621 Acute and chronic respiratory failure with hypoxia: Principal | ICD-10-CM

## 2019-12-09 LAB — GLUCOSE, CAPILLARY
Glucose-Capillary: 104 mg/dL — ABNORMAL HIGH (ref 70–99)
Glucose-Capillary: 104 mg/dL — ABNORMAL HIGH (ref 70–99)
Glucose-Capillary: 104 mg/dL — ABNORMAL HIGH (ref 70–99)
Glucose-Capillary: 105 mg/dL — ABNORMAL HIGH (ref 70–99)
Glucose-Capillary: 97 mg/dL (ref 70–99)
Glucose-Capillary: 99 mg/dL (ref 70–99)

## 2019-12-09 LAB — RENAL FUNCTION PANEL
Albumin: 2.3 g/dL — ABNORMAL LOW (ref 3.5–5.0)
Anion gap: 16 — ABNORMAL HIGH (ref 5–15)
BUN: 70 mg/dL — ABNORMAL HIGH (ref 6–20)
CO2: 25 mmol/L (ref 22–32)
Calcium: 8.2 mg/dL — ABNORMAL LOW (ref 8.9–10.3)
Chloride: 91 mmol/L — ABNORMAL LOW (ref 98–111)
Creatinine, Ser: 5.46 mg/dL — ABNORMAL HIGH (ref 0.61–1.24)
GFR calc Af Amer: 13 mL/min — ABNORMAL LOW (ref >60)
GFR calc non Af Amer: 11 mL/min — ABNORMAL LOW (ref >60)
Glucose, Bld: 99 mg/dL (ref 70–99)
Phosphorus: 2.1 mg/dL — ABNORMAL LOW (ref 2.5–4.6)
Potassium: 5.1 mmol/L (ref 3.5–5.1)
Sodium: 132 mmol/L — ABNORMAL LOW (ref 135–145)

## 2019-12-09 MED ORDER — K PHOS MONO-SOD PHOS DI & MONO 155-852-130 MG PO TABS
250.0000 mg | ORAL_TABLET | Freq: Two times a day (BID) | ORAL | Status: AC
  Administered 2019-12-09 – 2019-12-10 (×3): 250 mg via ORAL
  Filled 2019-12-09 (×3): qty 1

## 2019-12-09 MED ORDER — SODIUM CHLORIDE 0.9% FLUSH: 10.0000 mL | INTRAVENOUS | Status: DC | PRN

## 2019-12-09 MED ORDER — RENA-VITE PO TABS
1.0000 | ORAL_TABLET | Freq: Every day | ORAL | Status: DC
  Administered 2019-12-09 – 2019-12-23 (×15): 1 via ORAL
  Filled 2019-12-09 (×15): qty 1

## 2019-12-09 MED ORDER — SODIUM CHLORIDE 0.9% FLUSH
10.0000 mL | Freq: Two times a day (BID) | INTRAVENOUS | Status: DC
  Administered 2019-12-09 – 2019-12-23 (×20): 10 mL

## 2019-12-09 MED ORDER — SODIUM ZIRCONIUM CYCLOSILICATE 10 G PO PACK
10.0000 g | PACK | Freq: Once | ORAL | Status: AC
  Administered 2019-12-09: 10:00:00 10 g via ORAL
  Filled 2019-12-09: qty 1

## 2019-12-09 MED ORDER — PROCHLORPERAZINE MALEATE 5 MG PO TABS
5.0000 mg | ORAL_TABLET | Freq: Four times a day (QID) | ORAL | Status: DC | PRN
  Administered 2019-12-09 – 2019-12-15 (×7): 5 mg
  Filled 2019-12-09 (×8): qty 1

## 2019-12-09 NOTE — Progress Notes (Signed)
Okay to place midline in right arm per Dr Princella Pellegrini.

## 2019-12-09 NOTE — Progress Notes (Signed)
Patient ID: [name] [name], male   DOB: 09/17/1966, 53 y.o.   MRN: [id number]  [facility] Progress Note   Assessment/ Plan:   1. Status post PEA and V. tach arrest: On amiodarone and now anticoagulation with apixaban.  We will continue to monitor closely with recurrent ventricular tachycardia episodes as he had at his last dialysis treatment.  Not a candidate for implantable defibrillator secondary to prior history of drug abuse and nonadherence.  Cardiogenic shock resolved. 2. ESRD: He is usually on a Monday/Wednesday/Friday schedule and will undergo hemodialysis again tomorrow per holiday schedule.  I will give low-dose Lokelma today.  Delia and his family are working with palliative care to reassess goals of care with some consideration given to withdrawal from dialysis. 3. Anemia: Without overt blood loss, low hemoglobin/hematocrit noted with last Aranesp dose on 12/22. 4. CKD-MBD: With low phosphorus levels noted secondary to limited intake, will give enteral phosphorus. 5. Nutrition: Ongoing tube feeds at this time, restart renal multivitamin. 6. Bilateral lower extremity ischemia: Currently on anticoagulation with apixaban and seen earlier by vascular surgery with likely amputation following demarcation.  Subjective:   Without acute events overnight, sat on the side of his bed with assistance from RN earlier this morning.   Objective:   BP 110/74   Pulse 90   Temp 98.9 F (37.2 C) (Oral)   Resp 20   Ht 5' 10.5" (1.791 m)   Wt 81.4 kg   SpO2 100%   BMI 25.39 kg/m   Physical Exam: Gen: Appears comfortable resting in bed, on ventilator via trach, NGT in situ CVS: Pulse regular rhythm, normal rate, S1 and S2 normal Resp: Anteriorly clear to auscultation, no rales/rhonchi Abd: Soft, mildly distended, bowel sounds normal Ext: Trace lower extremity edema, bilateral leg/ankle wrapped in clean gauze.  Ischemic changes of digits noted.  Left upper arm AV fistula with  thrill.  Labs: BMET Recent Labs  Lab 12/03/19 0418 12/04/19 0322 12/05/19 0215 12/06/19 0218 12/07/19 0246 12/08/19 0537 12/09/19 0320  NA 134* 132* 131* 134* 130*  131* 132* 132*  K 3.7 4.3 5.1 4.5 5.5*  5.4* 4.6 5.1  CL 94* 92* 92* 93* 90*  90* 91* 91*  CO2 _0 GLUCOSE 118* 113* 116* 112* 95  97 108* 99  BUN 39* 65* 85* 47* 72*  73* 48* 70*  CREATININE 4.07* 5.37* 6.65* 4.52* 5.80*  5.85* 4.24* 5.46*  CALCIUM 7.7* 7.4* 7.4* 7.8* 7.8*  7.8* 8.0* 8.2*  PHOS 2.0* 2.5 2.8 2.3* 2.4*  2.4* 1.8* 2.1*   CBC Recent Labs  Lab 12/05/19 0215 12/06/19 0218 12/07/19 0246 12/08/19 0537  WBC 14.7* 10.9* 12.3* 10.8*  NEUTROABS 11.1* 8.3* 9.3* 8.3*  HGB 8.1* 7.2* 7.8* 7.1*  HCT 24.8* 22.8* 25.0* 22.4*  MCV 89.5 91.2 91.9 91.4  PLT 476* 417* 415* 393      Medications:    . amiodarone  400 mg Oral BID  . apixaban  2.5 mg Oral BID  . B-complex with vitamin C  1 tablet Per Tube Daily  . chlorhexidine gluconate (MEDLINE KIT)  15 mL Mouth Rinse BID  . Chlorhexidine Gluconate Cloth  6 each Topical Q0600  . Chlorhexidine Gluconate Cloth  6 each Topical Q0600  . darbepoetin (ARANESP) injection - DIALYSIS  100 mcg Intravenous Q Wed-HD  . docusate  100 mg Per Tube Daily  . feeding supplement (PRO-STAT SUGAR FREE 64)  30 mL Per Tube TID  .  insulin aspart  0-9 Units Subcutaneous Q4H  . mouth rinse  15 mL Mouth Rinse 10 times per day  . midodrine  10 mg Per Tube Q8H  . pantoprazole sodium  40 mg Per Tube QHS  . sodium chloride flush  10-40 mL Intracatheter Q12H  . white petrolatum   Topical Daily   Elmarie Shiley, MD 12/09/2019, 6:46 AM

## 2019-12-09 NOTE — Progress Notes (Signed)
Progress Note  Patient Name: [name] [name] Date of Encounter: 12/09/2019  Primary Cardiologist: Fransico Him, MD   Subjective   Remains with ET tube in place, c/o pain at site.  Inpatient Medications    Scheduled Meds: . amiodarone  400 mg Oral BID  . apixaban  2.5 mg Oral BID  . chlorhexidine gluconate (MEDLINE KIT)  15 mL Mouth Rinse BID  . Chlorhexidine Gluconate Cloth  6 each Topical Q0600  . Chlorhexidine Gluconate Cloth  6 each Topical Q0600  . darbepoetin (ARANESP) injection - DIALYSIS  100 mcg Intravenous Q Wed-HD  . docusate  100 mg Per Tube Daily  . feeding supplement (PRO-STAT SUGAR FREE 64)  30 mL Per Tube TID  . insulin aspart  0-9 Units Subcutaneous Q4H  . mouth rinse  15 mL Mouth Rinse 10 times per day  . midodrine  10 mg Per Tube Q8H  . multivitamin  1 tablet Oral QHS  . pantoprazole sodium  40 mg Per Tube QHS  . phosphorus  250 mg Oral BID  . sodium chloride flush  10-40 mL Intracatheter Q12H  . sodium zirconium cyclosilicate  10 g Oral Once  . white petrolatum   Topical Daily   Continuous Infusions: . sodium chloride Stopped (12/01/19 1424)  . feeding supplement (VITAL 1.5 CAL) 1,000 mL (12/09/19 0509)   PRN Meds: sodium chloride, acetaminophen (TYLENOL) oral liquid 160 mg/5 mL, acetaminophen, ALPRAZolam, fentaNYL (SUBLIMAZE) injection, oxyCODONE, prochlorperazine, sodium chloride flush   Vital Signs    Vitals:   12/09/19 0400 12/09/19 0500 12/09/19 0600 12/09/19 0700  BP: 107/68 108/67 110/74   Pulse: 82 89 90   Resp: 20 20 20    Temp:    99.2 F (37.3 C)  TempSrc:    Axillary  SpO2: 100% 100% 100%   Weight:  81.4 kg    Height:        Intake/Output Summary (Last 24 hours) at 12/09/2019 0917 Last data filed at 12/09/2019 0600 Gross per 24 hour  Intake 1190 ml  Output 200 ml  Net 990 ml   Filed Weights   12/07/19 1743 12/08/19 0500 12/09/19 0500  Weight: 80.3 kg 81 kg 81.4 kg    Telemetry    Atrial fib with a controlled VR -  Personally Reviewed  ECG    none - Personally Reviewed  Physical Exam   GEN: No acute distress.   Neck: 6 cm JVD Cardiac: IRIRR, no murmurs, rubs, or gallops.  Respiratory: Clear to auscultation bilaterally. GI: Soft, nontender, non-distended  MS: No edema; No deformity. Neuro:  Nonfocal  Psych: Normal affect   Labs    Chemistry Recent Labs  Lab 12/03/19 0418 12/04/19 0322 12/07/19 0246 12/08/19 0537 12/09/19 0320  NA 134* 132* 130*  131* 132* 132*  K 3.7 4.3 5.5*  5.4* 4.6 5.1  CL 94* 92* 90*  90* 91* 91*  CO2 25 24 24  24 25 25   GLUCOSE 118* 113* 95  97 108* 99  BUN 39* 65* 72*  73* 48* 70*  CREATININE 4.07* 5.37* 5.80*  5.85* 4.24* 5.46*  CALCIUM 7.7* 7.4* 7.8*  7.8* 8.0* 8.2*  PROT 6.5 6.5  --   --   --   ALBUMIN 2.2*  2.2* 2.2*  2.1* 2.1* 2.3* 2.3*  AST 19 16  --   --   --   ALT 21 18  --   --   --   ALKPHOS 113 114  --   --   --  BILITOT 0.8 0.9  --   --   --   GFRNONAA 16* 11* 10*  10* 15* 11*  GFRAA 18* 13* 12*  12* 18* 13*  ANIONGAP 15 16* 16*  17* 16* 16*     Hematology Recent Labs  Lab 12/06/19 0218 12/07/19 0246 12/08/19 0537  WBC 10.9* 12.3* 10.8*  RBC 2.50* 2.72* 2.45*  HGB 7.2* 7.8* 7.1*  HCT 22.8* 25.0* 22.4*  MCV 91.2 91.9 91.4  MCH 28.8 28.7 29.0  MCHC 31.6 31.2 31.7  RDW 18.2* 17.8* 17.6*  PLT 417* 415* 393    Cardiac EnzymesNo results for input(s): TROPONINI in the last 168 hours. No results for input(s): TROPIPOC in the last 168 hours.   BNPNo results for input(s): BNP, PROBNP in the last 168 hours.   DDimer No results for input(s): DDIMER in the last 168 hours.   Radiology    No results found.  Cardiac Studies     Patient Profile     53 y.o. male admitted with a cardiac arrest, ESRD, emergent, now revised tracheostomy, atrial fib.  Assessment & Plan    1. VT/VF - he has had no recurrent ventricular arrhythmias. Continue amiodarone orally down our NGT. 2. Atrial fib - his VR is well controlled. He  will continue rate control with amiodarone and digoxin. 3. VDRF - weaning has been slow but is ongoing. Unclear if/when he will be able to have tracheostomy removed. Not really a candidate for device implant with trach tube in place.    For questions or updates, please contact [place] Please consult [email or web address] for contact info under Cardiology/STEMI.   Signed, Cristopher Peru, MD  12/09/2019, 9:17 AM  Patient ID: [name], male   DOB: [birth year], 53 y.o.   MRN: [id number]

## 2019-12-09 NOTE — Progress Notes (Signed)
[facility] PROGRESS NOTE  [name] [name] YFV:[id number] DOB: 11/25/1966 DOA: 11/16/2019 PCP: Jean Rosenthal, MD  Brief summary   53 year old man with PMH of HTN, CHF, Alcohol use, Substance use, ESRD on HD is admitted with PEA arrest at HD unit. Emergent cricothyrotomy performed by EMS due to failed intubation with [facility] airway.  12/08/19 Patient is minimally verbal d/t trach in place, he denies pain but dressing around the trach is bothering him so this was loosened slightly and nurse was alerted to change dressing, pt shook head "no" when I asked if there was anything else we could do today. Palliative care is having GOC discussion w/ family today, await update. Stable otherwise.    Today 12/09/19: Goal still to get him weaned off vent and go from there. Pt denies pain. Legs look ok, see phys exam notes   Assessment/Plan:  S/P Cardiac arrest. V fib/arrythmias. In the setting of combined systolic and diastolic CHF, NSVT, prolonged QT -Transition to p.o. amiodarone. Not a candidate for internal cardiac defibrillator per EP. Cont monitoring on tele   A fib: HR is controlled. On amiodarone p.o. Pt was briefly on iv heparin. Then stopped due to low hemoglobin, restarted on apixaban at 2.5 mg by PCCM on 12/23  Acute on chronic respiratory failure, difficult intubation needing cric- s/p  Trach .  -remains on vent. Cont per pulmonology, weaning is goal  Subsegmental PE: . Pt was briefly on iv heparin. Then stopped due to low hemoglobin, -restarted on apixaban at 2.5 mg by PCCM on 12/23.   ESRD-intermittent HD. Cont per nephrology   Hx polysustance abuse- cocaine, alcohol, tobacco. Monitor no s/s of etoh withdrawals   Ileus-resolved as documented by KUB  Anemia normocytic. No s/s of acute bleeding. OK to continue Eliquis for now. Monitor Hg, occult stool blood test prn. Hgb low around 7-8 past few days but not looking worse.   Ischemic changes of feet -due to low flow, Seen by VVS  12/18 - VVS consult concerned that patient is at risk for amputation of lower extremity and blisters are signaling ischemia. Recommending supportive care and await demarcation. Patient not interested in amputation   Code Status: full Family Communication: d/w patient, RN (indicate person spoken with, relationship, and if by phone, the number) Disposition Plan: remains inpatient, [place] discussion w/ palliative care is pending    Consultants: Nephrology Electrophysiology ENT [place] 12/18   Procedures: 12/10-trach 12/4 RT fem HD cath >> out 12/2 RT [place] CVL >>  Antibiotics: Anti-infectives (From admission, onward)   Start     Dose/Rate Route Frequency Ordered Stop   11/22/19 1800  cefTRIAXone (ROCEPHIN) 2 g in sodium chloride 0.9 % 100 mL IVPB     2 g 200 mL/hr over 30 Minutes Intravenous Every 24 hours 11/22/19 0723 11/27/19 1820   11/18/19 1300  Ampicillin-Sulbactam (UNASYN) 3 g in sodium chloride 0.9 % 100 mL IVPB  Status:  Discontinued     3 g 200 mL/hr over 30 Minutes Intravenous Every 8 hours 11/18/19 1230 11/22/19 0722   11/18/19 1200  ampicillin-sulbactam (UNASYN) 1.5 g in sodium chloride 0.9 % 100 mL IVPB  Status:  Discontinued     1.5 g 200 mL/hr over 30 Minutes Intravenous Every 12 hours 11/18/19 1150 11/18/19 1230         HPI/Subjective: Remains on vent. No acute distress.   Objective: Vitals:   12/09/19 0600 12/09/19 0700  BP: 110/74   Pulse: 90   Resp: 20  Temp:  99.2 F (37.3 C)  SpO2: 100%     Intake/Output Summary (Last 24 hours) at 12/09/2019 0715 Last data filed at 12/09/2019 0600 Gross per 24 hour  Intake 1390 ml  Output 200 ml  Net 1190 ml   Filed Weights   12/07/19 1743 12/08/19 0500 12/09/19 0500  Weight: 80.3 kg 81 kg 81.4 kg    Exam:   General:  No distress   Cardiovascular: s1,s2 rrr  Respiratory: diminished at bases   Abdomen: soft, nt   Musculoskeletal: pedal edema minimally  Skin LE: bandages in place not removed, skin  at toes is warm    Data Reviewed: Basic Metabolic Panel: Recent Labs  Lab 12/05/19 0215 12/06/19 0218 12/07/19 0246 12/08/19 0537 12/09/19 0320  NA 131* 134* 130*  131* 132* 132*  K 5.1 4.5 5.5*  5.4* 4.6 5.1  CL 92* 93* 90*  90* 91* 91*  CO2 24 25 24  24 25 25   GLUCOSE 116* 112* 95  97 108* 99  BUN 85* 47* 72*  73* 48* 70*  CREATININE 6.65* 4.52* 5.80*  5.85* 4.24* 5.46*  CALCIUM 7.4* 7.8* 7.8*  7.8* 8.0* 8.2*  MG  --  1.9 2.1 2.0  --   PHOS 2.8 2.3* 2.4*  2.4* 1.8* 2.1*   Liver Function Tests: Recent Labs  Lab 12/03/19 0418 12/04/19 0322 12/05/19 0215 12/06/19 0218 12/07/19 0246 12/08/19 0537 12/09/19 0320  AST 19 16  --   --   --   --   --   ALT 21 18  --   --   --   --   --   ALKPHOS 113 114  --   --   --   --   --   BILITOT 0.8 0.9  --   --   --   --   --   PROT 6.5 6.5  --   --   --   --   --   ALBUMIN 2.2*  2.2* 2.2*  2.1* 2.2* 2.1* 2.1* 2.3* 2.3*   No results for input(s): LIPASE, AMYLASE in the last 168 hours. No results for input(s): AMMONIA in the last 168 hours. CBC: Recent Labs  Lab 12/04/19 0322 12/05/19 0215 12/06/19 0218 12/07/19 0246 12/08/19 0537  WBC 13.7* 14.7* 10.9* 12.3* 10.8*  NEUTROABS 10.7* 11.1* 8.3* 9.3* 8.3*  HGB 7.6* 8.1* 7.2* 7.8* 7.1*  HCT 23.1* 24.8* 22.8* 25.0* 22.4*  MCV 89.5 89.5 91.2 91.9 91.4  PLT 457* 476* 417* 415* 393   Cardiac Enzymes: No results for input(s): CKTOTAL, CKMB, CKMBINDEX, TROPONINI in the last 168 hours. BNP (last 3 results) Recent Labs    11/16/19 1356  BNP 962.8*    ProBNP (last 3 results) No results for input(s): PROBNP in the last 8760 hours.  CBG: Recent Labs  Lab 12/08/19 1516 12/08/19 1913 12/08/19 2306 12/09/19 0309 12/09/19 0709  GLUCAP 100* 85 99 104* 105*    No results found for this or any previous visit (from the past 240 hour(s)).   Studies: No results found.  Scheduled Meds: . amiodarone  400 mg Oral BID  . apixaban  2.5 mg Oral BID  . chlorhexidine  gluconate (MEDLINE KIT)  15 mL Mouth Rinse BID  . Chlorhexidine Gluconate Cloth  6 each Topical Q0600  . Chlorhexidine Gluconate Cloth  6 each Topical Q0600  . darbepoetin (ARANESP) injection - DIALYSIS  100 mcg Intravenous Q Wed-HD  . docusate  100 mg Per Tube Daily  .  feeding supplement (PRO-STAT SUGAR FREE 64)  30 mL Per Tube TID  . insulin aspart  0-9 Units Subcutaneous Q4H  . mouth rinse  15 mL Mouth Rinse 10 times per day  . midodrine  10 mg Per Tube Q8H  . multivitamin  1 tablet Oral QHS  . pantoprazole sodium  40 mg Per Tube QHS  . phosphorus  250 mg Oral BID  . sodium chloride flush  10-40 mL Intracatheter Q12H  . sodium zirconium cyclosilicate  10 g Oral Once  . white petrolatum   Topical Daily   Continuous Infusions: . sodium chloride Stopped (12/01/19 1424)  . feeding supplement (VITAL 1.5 CAL) 1,000 mL (12/09/19 0509)    Active Problems:   Acute on chronic systolic CHF (congestive heart failure) (HCC)   Nonischemic cardiomyopathy (HCC)   Cardiac arrest (HCC)   Paroxysmal atrial fibrillation (HCC)   Acute on chronic respiratory failure with hypoxia (HCC)   ESRD on hemodialysis ([place])   Pressure injury of skin   Palliative care by specialist   Tracheostomy in place [facility])   Endotracheally intubated    Time spent: >25 minutes     Emeterio Reeve  [facility]  12/09/2019, 7:15 AM  LOS: 23 days

## 2019-12-09 NOTE — Progress Notes (Addendum)
NAME:  [name] [name], MRN:  [id number], DOB:  Mar 29, 1966, LOS: 50 ADMISSION DATE:  11/16/2019, CONSULTATION DATE:  12/2 REFERRING MD:  Dr. Sedonia Small, CHIEF COMPLAINT:  Post cardiac arrest    Brief History   53yo male presented post PEA arrest which occurred during iHD treatment as outpatient. This was a witnessed arrest with quick bystander CPR. Received 3 rounds of epi, 1g of calcium and 50 mEq of bicarb with ROSC after 15 minutes.   Emergent cricothyrotomy performed by EMS due to failed intubation with [facility] airway.   Past Medical History  NSVT 2017 Nonischemic cardiomyopathy Medical noncompliance Polysubstance abuse Hypertension End-stage renal disease Combined systolic and diastolic congestive heart failure Cocaine abuse Alcohol abuse Prolonged QTC  Significant Hospital Events   12/2 Admitted post cardiac arrest, emergent cric, 33 C TTM 12/8 V. tach arrest 12/10 Cric changed to tracheostomy 12/16 HD, tolerated only 30 minutes of trach collar 12/18 - He is critically ill, remains on vent. Soft blood pressure overnight but off pressors Afebrile. Rx HD 12/19 -  MAP > 65 with slow SBP. Doing PSV via trach. EP wants amio gtt cntinued through 12/21 and then decide on Dc. RN concerned about bilsters on feet and knees. She has called wound care. Not on sedation gtt. Following commands/. No active bleeding. Diarrhea + per RN 12/20: VVS recommends continued monitoring of LE and allowed necrosis to demarcate. Patient reportedly opposed to idea of amputaiton. Failed ATC and back on full vent. Amio gtt cotinues.  Afebrile.  Renam planning repeat HD v CRRT with pressor support 12/10/19 based on BP. Denies complaints other than LE pain. On TF 12/06/2019 complains of any pain with manipulation of lower extremities.  Currently on full mechanical ventilatory support.  No acute distress 12/23 did not tolerate wean well 12/24 Not tolerating wean, no events overnight 12/09/2019 -called by RN for  increased anxiety when attempted to come off vent on ATC. Pt with increased wob on atc, getting dialysis at this time.   Consults:  Nephrology Electrophysiology ENT [place] 12/18   Procedures:  12/10-trach 12/4 RT fem HD cath >> out 12/2 RT [place] CVL >>  Significant Diagnostic Tests:  CTA 12/2-segmental filling defects in the right middle lobe and right lower lobe pulmonary arteries, pneumomediastinum, subcu emphysema CT head 12/8-no acute abnormality  Echocardiogram 11/17/19-LVEF less than 20%, normal LV systolic function  Micro Data:  COVID 12/2 > negative Aspirate 12/04+ for Haemophilus influenza  12/11 Bc >> ng  Antimicrobials:  Unasyn 12/4>>12/7 Ceftriazone 12/7>>12/13  Interim history/subjective:  Did not tolerate weaning this morning Mouthed "I'm scared" and became tearful during our conversation and exam this morning  Objective   Blood pressure 110/74, pulse 90, temperature 99.2 F (37.3 C), temperature source Axillary, resp. rate 20, height 5' 10.5" (1.791 m), weight 81.4 kg, SpO2 100 %.    Vent Mode: PRVC FiO2 (%):  [40 %] 40 % Set Rate:  [20 bmp] 20 bmp Vt Set:  [590 mL] 590 mL PEEP:  [5 cmH20] 5 cmH20 Pressure Support:  [12 cmH20] 12 cmH20 Plateau Pressure:  [20 cmH20-22 cmH20] 21 cmH20   Intake/Output Summary (Last 24 hours) at 12/09/2019 0749 Last data filed at 12/09/2019 0600 Gross per 24 hour  Intake 1390 ml  Output 200 ml  Net 1190 ml   Filed Weights   12/07/19 1743 12/08/19 0500 12/09/19 0500  Weight: 80.3 kg 81 kg 81.4 kg      Recent Labs    12/07/19 0246 12/08/19 0537  HGB 7.8* 7.1*   Examination:  General: Acute on chronically ill appearing adult M, trach/vent NAD  HEENT: NCAT Pink mmm. Trach secure. Anicteric sclera  Neuro: Awake, alert, following commands. PERRL  CV: RRR s1s2 no rgm  PULM: Diminished bibasilar sounds. Scattered rhonchi. No accessory muscle use  GI: Soft, NT, ND and +BS Extremities: RUE edema Skin: Demarcation,  wounds bilateral feet   Resolved Hospital Problem list   Ileus Cardiogenic shock   Assessment & Plan:   Cardiac arrest- doubt PE but contrast timing poor, venous duplex negative  Vfib rhythm,  Combined systolic and diastolic CHF, NSVT, prolonged QT Plan Not a candidate for ICD per EP  Continue PO amio; if no further VT after 1 week, reduce amiodarone to 400mg  qD  Continue tele Appreciate cardiology assistance and recs   Afib:  Amiodarone  Tele  Continue Noac; not on heparin due to anemia  Consider sq heparin  Acute on chronic respiratory failure  -difficult intubation needing cric in field -now s/p tracheostomy  Plan Continue attempting PS weans with goal to progress to TC   Subsegmental PE:  Apixiban   ESRD Plan Per nephrology Did not tolerate iHD 12/23 due to VT; next attempt will be 12/26   Anxiety As needed Xanax  Hx polysustance abuse- cocaine, alcohol, tobacco Plan Will need cessation counseling   Anemia -chronic illness, critical illness, blood loss related to frequent lab draws Plan Trend CBC prn and transfuse per protocol   Ischemic changes of feet  -due to low flow, obtain vascular input, now off IV heparin. Seen by VVS 12/18 - VVS concerned that patient at risk for amputation of lower extremity and blisters are signaling ischemia. Recommending supportive care and await demarcation. Patient not interested in amputation Plan  Wound care Vascular vein surgery is following  Difficult IV access -At present there is not indication for CVC. If there is an indication, this may need to be evaluated by IR  Plan Recommend changing IV medications to PO  IV team consult   Goals of Care -Palliative Care has been very thoughtful with [place] discussions in setting of decompensated and end-stage chronic conditions and acute/recent critical illness. At present, family wishes for full scope of treatment. Appreciate Palliative Care continuing these discussions    LABS   PULMONARY No results for input(s): PHART, PCO2ART, PO2ART, HCO3, TCO2, O2SAT in the last 168 hours.  Invalid input(s): PCO2, PO2  CBC Recent Labs  Lab 12/06/19 0218 12/07/19 0246 12/08/19 0537  HGB 7.2* 7.8* 7.1*  HCT 22.8* 25.0* 22.4*  WBC 10.9* 12.3* 10.8*  PLT 417* 415* 393    COAGULATION No results for input(s): INR in the last 168 hours.  CARDIAC  No results for input(s): TROPONINI in the last 168 hours. No results for input(s): PROBNP in the last 168 hours.   CHEMISTRY Recent Labs  Lab 12/05/19 0215 12/06/19 0218 12/07/19 0246 12/08/19 0537 12/09/19 0320  NA 131* 134* 130*  131* 132* 132*  K 5.1 4.5 5.5*  5.4* 4.6 5.1  CL 92* 93* 90*  90* 91* 91*  CO2 24 25 24  24 25 25   GLUCOSE 116* 112* 95  97 108* 99  BUN 85* 47* 72*  73* 48* 70*  CREATININE 6.65* 4.52* 5.80*  5.85* 4.24* 5.46*  CALCIUM 7.4* 7.8* 7.8*  7.8* 8.0* 8.2*  MG  --  1.9 2.1 2.0  --   PHOS 2.8 2.3* 2.4*  2.4* 1.8* 2.1*   Estimated Creatinine Clearance: 16.8  mL/min (A) (by C-G formula based on SCr of 5.46 mg/dL (H)).   LIVER Recent Labs  Lab 12/03/19 0418 12/04/19 0322 12/05/19 0215 12/06/19 0218 12/07/19 0246 12/08/19 0537 12/09/19 0320  AST 19 16  --   --   --   --   --   ALT 21 18  --   --   --   --   --   ALKPHOS 113 114  --   --   --   --   --   BILITOT 0.8 0.9  --   --   --   --   --   PROT 6.5 6.5  --   --   --   --   --   ALBUMIN 2.2*  2.2* 2.2*  2.1* 2.2* 2.1* 2.1* 2.3* 2.3*     INFECTIOUS Recent Labs  Lab 12/04/19 0322  LATICACIDVEN 1.5     ENDOCRINE CBG (last 3)  Recent Labs    12/08/19 2306 12/09/19 0309 12/09/19 0709  GLUCAP 99 104* 105*   CRITICAL CARE Performed by: Cristal Generous   Total critical care time: 30 minutes  Critical care time was exclusive of separately billable procedures and treating other patients. Critical care was necessary to treat or prevent imminent or life-threatening deterioration.  Critical care  was time spent personally by me on the following activities: development of treatment plan with patient and/or surrogate as well as nursing, discussions with consultants, evaluation of patient's response to treatment, examination of patient, obtaining history from patient or surrogate, ordering and performing treatments and interventions, ordering and review of laboratory studies, ordering and review of radiographic studies, pulse oximetry and re-evaluation of patient's condition.  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  This morning, failed weaning and had to be placed back on the ventilator.  Coarse BS diffusely on full vent support.  I reviewed CXR myself, trach is in a good position.  Discussed with PCCM-NP.  Will continue full vent support.  Active diureses.  Attempt weaning again this afternoon.  Replace electrolytes.  Restart TF.  Needs placement.  The patient is critically ill with multiple organ systems failure and requires high complexity decision making for assessment and support, frequent evaluation and titration of therapies, application of advanced monitoring technologies and extensive interpretation of multiple databases.   Critical Care Time devoted to patient care services described in this note is  31  Minutes. This time reflects time of care of this signee Dr Jennet Maduro. This critical care time does not reflect procedure time, or teaching time or supervisory time of PA/NP/Med student/Med Resident etc but could involve care discussion time.  Rush Farmer, M.D. [facility] Pulmonary/Critical Care Medicine.

## 2019-12-10 DIAGNOSIS — G931 Anoxic brain damage, not elsewhere classified: Secondary | ICD-10-CM

## 2019-12-10 LAB — CBC
HCT: 24.9 % — ABNORMAL LOW (ref 39.0–52.0)
Hemoglobin: 7.7 g/dL — ABNORMAL LOW (ref 13.0–17.0)
MCH: 27.7 pg (ref 26.0–34.0)
MCHC: 30.9 g/dL (ref 30.0–36.0)
MCV: 89.6 fL (ref 80.0–100.0)
Platelets: 393 10*3/uL (ref 150–400)
RBC: 2.78 MIL/uL — ABNORMAL LOW (ref 4.22–5.81)
RDW: 17.3 % — ABNORMAL HIGH (ref 11.5–15.5)
WBC: 12.7 10*3/uL — ABNORMAL HIGH (ref 4.0–10.5)
nRBC: 0.3 % — ABNORMAL HIGH (ref 0.0–0.2)

## 2019-12-10 LAB — COMPREHENSIVE METABOLIC PANEL
ALT: 14 U/L (ref 0–44)
AST: 14 U/L — ABNORMAL LOW (ref 15–41)
Albumin: 2.3 g/dL — ABNORMAL LOW (ref 3.5–5.0)
Alkaline Phosphatase: 168 U/L — ABNORMAL HIGH (ref 38–126)
Anion gap: 16 — ABNORMAL HIGH (ref 5–15)
BUN: 90 mg/dL — ABNORMAL HIGH (ref 6–20)
CO2: 26 mmol/L (ref 22–32)
Calcium: 7.9 mg/dL — ABNORMAL LOW (ref 8.9–10.3)
Chloride: 91 mmol/L — ABNORMAL LOW (ref 98–111)
Creatinine, Ser: 6.56 mg/dL — ABNORMAL HIGH (ref 0.61–1.24)
GFR calc Af Amer: 10 mL/min — ABNORMAL LOW (ref >60)
GFR calc non Af Amer: 9 mL/min — ABNORMAL LOW (ref >60)
Glucose, Bld: 94 mg/dL (ref 70–99)
Potassium: 5.3 mmol/L — ABNORMAL HIGH (ref 3.5–5.1)
Sodium: 133 mmol/L — ABNORMAL LOW (ref 135–145)
Total Bilirubin: 0.7 mg/dL (ref 0.3–1.2)
Total Protein: 6.7 g/dL (ref 6.5–8.1)

## 2019-12-10 LAB — GLUCOSE, CAPILLARY
Glucose-Capillary: 102 mg/dL — ABNORMAL HIGH (ref 70–99)
Glucose-Capillary: 104 mg/dL — ABNORMAL HIGH (ref 70–99)
Glucose-Capillary: 83 mg/dL (ref 70–99)
Glucose-Capillary: 83 mg/dL (ref 70–99)
Glucose-Capillary: 85 mg/dL (ref 70–99)
Glucose-Capillary: 88 mg/dL (ref 70–99)

## 2019-12-10 LAB — PHOSPHORUS: Phosphorus: 3.2 mg/dL (ref 2.5–4.6)

## 2019-12-10 LAB — MAGNESIUM: Magnesium: 2 mg/dL (ref 1.7–2.4)

## 2019-12-10 NOTE — Progress Notes (Signed)
Patient ID: [name] [name], male   DOB: 1966-08-02, 53 y.o.   MRN: [id number]  [facility] Progress Note   Assessment/ Plan:   1. Status post PEA and V. tach arrest: On amiodarone and now anticoagulation with apixaban.  Monitor closely for recurrent V. tach during dialysis.  Cardiogenic shock is resolved and is not a candidate for ICD placement due to history of drug abuse/nonadherence. 2. ESRD: He is usually on a Monday/Wednesday/Friday schedule and will undergo hemodialysis again today per holiday schedule.  He continues to struggle trying to make decisions regarding long-term goals of care including consideration to stop dialysis. 3. Anemia: Without overt blood loss, low hemoglobin/hematocrit noted with last Aranesp dose on 12/22. 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 Nutrition: Ongoing tube feeds at this time, restart renal multivitamin. 6. Bilateral lower extremity ischemia: Currently on anticoagulation with apixaban and seen earlier by vascular surgery with likely amputation following demarcation.  Subjective:   Without acute events overnight, continues to have intermittent pain from tracheostomy site.   Objective:   BP 111/73   Pulse 74   Temp 98.6 F (37 C) (Oral)   Resp 20   Ht 5' 10.5" (1.791 m)   Wt 82.6 kg   SpO2 96%   BMI 25.76 kg/m   Physical Exam: Gen: Appears comfortable resting in bed, on ventilator via trach, NGT in situ CVS: Pulse regular rhythm, normal rate, S1 and S2 normal Resp: Anteriorly clear to auscultation, no rales/rhonchi.  Asymmetric left neck/upper chest swelling that is soft Abd: Soft, mildly distended, bowel sounds normal Ext: Trace lower extremity edema, bilateral leg/ankle wrapped in clean gauze.  Ischemic changes of digits noted.  Left upper arm AV fistula with hyperpulsatility, intact dressings.  Labs: BMET Recent Labs  Lab 12/04/19 0322 12/05/19 0215  12/06/19 0218 12/07/19 0246 12/08/19 0537 12/09/19 0320 12/10/19 0402  NA 132* 131* 134* 130*  131* 132* 132* 133*  K 4.3 5.1 4.5 5.5*  5.4* 4.6 5.1 5.3*  CL 92* 92* 93* 90*  90* 91* 91* 91*  CO2 24 24 25 24  24 25 25 26   GLUCOSE 113* 116* 112* 95  97 108* 99 94  BUN 65* 85* 47* 72*  73* 48* 70* 90*  CREATININE 5.37* 6.65* 4.52* 5.80*  5.85* 4.24* 5.46* 6.56*  CALCIUM 7.4* 7.4* 7.8* 7.8*  7.8* 8.0* 8.2* 7.9*  PHOS 2.5 2.8 2.3* 2.4*  2.4* 1.8* 2.1* 3.2   CBC Recent Labs  Lab 12/05/19 0215 12/06/19 0218 12/07/19 0246 12/08/19 0537 12/10/19 0402  WBC 14.7* 10.9* 12.3* 10.8* 12.7*  NEUTROABS 11.1* 8.3* 9.3* 8.3*  --   HGB 8.1* 7.2* 7.8* 7.1* 7.7*  HCT 24.8* 22.8* 25.0* 22.4* 24.9*  MCV 89.5 91.2 91.9 91.4 89.6  PLT 476* 417* 415* 393 393      Medications:    . amiodarone  400 mg Oral BID  . apixaban  2.5 mg Oral BID  . chlorhexidine gluconate (MEDLINE KIT)  15 mL Mouth Rinse BID  . Chlorhexidine Gluconate Cloth  6 each Topical Q0600  . Chlorhexidine Gluconate Cloth  6 each Topical Q0600  . darbepoetin (ARANESP) injection - DIALYSIS  100 mcg Intravenous Q Wed-HD  . docusate  100 mg Per Tube Daily  . feeding supplement (PRO-STAT SUGAR FREE 64)  30 mL Per Tube TID  . insulin aspart  0-9 Units Subcutaneous Q4H  . mouth rinse  15 mL Mouth Rinse 10 times per day  .  midodrine  10 mg Per Tube Q8H  . multivitamin  1 tablet Oral QHS  . pantoprazole sodium  40 mg Per Tube QHS  . phosphorus  250 mg Oral BID  . sodium chloride flush  10-40 mL Intracatheter Q12H  . sodium chloride flush  10-40 mL Intracatheter Q12H  . white petrolatum   Topical Daily   Elmarie Shiley, MD 12/10/2019, 7:39 AM

## 2019-12-10 NOTE — Progress Notes (Signed)
[facility]  Subjective  - Shakes his head yes that his feet feel ok   Objective 112/70 92 98.7 F (37.1 C) (Oral) 20 95%  Intake/Output Summary (Last 24 hours) at 12/10/2019 1050 Last data filed at 12/10/2019 0600 Gross per 24 hour  Intake 1150 ml  Output 325 ml  Net 825 ml    Brisk PT/DP signals bilateral lower extremities  Laboratory Lab Results: Recent Labs    12/08/19 0537 12/10/19 0402  WBC 10.8* 12.7*  HGB 7.1* 7.7*  HCT 22.4* 24.9*  PLT 393 393   BMET Recent Labs    12/09/19 0320 12/10/19 0402  NA 132* 133*  K 5.1 5.3*  CL 91* 91*  CO2 25 26  GLUCOSE 99 94  BUN 70* 90*  CREATININE 5.46* 6.56*  CALCIUM 8.2* 7.9*    COAG Lab Results  Component Value Date   INR 1.6 (H) 11/16/2019   INR 1.5 (H) 11/16/2019   INR 1.36 10/27/2018   No results found for: PTT  Assessment/Planning:  53 year old male sustained cardiac arrest previously on pressors and has ischemic changes of his bilateral lower extremities.  He has evidence of bullous lesions to his bilateral lower shins as well as partial-thickness skin changes to the feet.  Very brisk dorsalis pedis and posterior tibial signals bilaterally.  We will continue to allow his feet to demarcate to see if he requires any level of amputation.  No signs of infection.  Marty Heck 12/10/2019 10:50 AM --

## 2019-12-10 NOTE — Progress Notes (Signed)
NAME:  [name] [name], MRN:  [id number], DOB:  Dec 12, 1966, LOS: 39 ADMISSION DATE:  11/16/2019, CONSULTATION DATE:  12/2 REFERRING MD:  Dr. Sedonia Small, CHIEF COMPLAINT:  Post cardiac arrest    Brief History   53yo male presented post PEA arrest which occurred during iHD treatment as outpatient. This was a witnessed arrest with quick bystander CPR. Received 3 rounds of epi, 1g of calcium and 50 mEq of bicarb with ROSC after 15 minutes.   Emergent cricothyrotomy performed by EMS due to failed intubation with [facility] airway.   Past Medical History  NSVT 2017 Nonischemic cardiomyopathy Medical noncompliance Polysubstance abuse Hypertension End-stage renal disease Combined systolic and diastolic congestive heart failure Cocaine abuse Alcohol abuse Prolonged QTC  Significant Hospital Events   12/2 Admitted post cardiac arrest, emergent cric, 33 C TTM 12/8 V. tach arrest 12/10 Cric changed to tracheostomy 12/16 HD, tolerated only 30 minutes of trach collar 12/18 - He is critically ill, remains on vent. Soft blood pressure overnight but off pressors Afebrile. Rx HD 12/19 -  MAP > 65 with slow SBP. Doing PSV via trach. EP wants amio gtt cntinued through 12/21 and then decide on Dc. RN concerned about bilsters on feet and knees. She has called wound care. Not on sedation gtt. Following commands/. No active bleeding. Diarrhea + per RN 12/20: VVS recommends continued monitoring of LE and allowed necrosis to demarcate. Patient reportedly opposed to idea of amputaiton. Failed ATC and back on full vent. Amio gtt cotinues.  Afebrile.  Renam planning repeat HD v CRRT with pressor support 12/10/19 based on BP. Denies complaints other than LE pain. On TF 12/06/2019 complains of any pain with manipulation of lower extremities.  Currently on full mechanical ventilatory support.  No acute distress 12/23 did not tolerate wean well 12/24 Not tolerating wean, no events overnight 12/09/2019 -called by RN for  increased anxiety when attempted to come off vent on ATC. Pt with increased wob on atc, getting dialysis at this time.   Consults:  Nephrology Electrophysiology ENT [place] 12/18   Procedures:  12/10-trach 12/4 RT fem HD cath >> out 12/2 RT [place] CVL >>  Significant Diagnostic Tests:  CTA 12/2-segmental filling defects in the right middle lobe and right lower lobe pulmonary arteries, pneumomediastinum, subcu emphysema CT head 12/8-no acute abnormality  Echocardiogram 11/17/19-LVEF less than 20%, normal LV systolic function  Micro Data:  COVID 12/2 > negative Aspirate 12/04+ for Haemophilus influenza  12/11 Bc >> ng  Antimicrobials:  Unasyn 12/4>>12/7 Ceftriazone 12/7>>12/13  Interim history/subjective:  Failed weaning this AM and desaturated  Objective   Blood pressure 112/70, pulse 74, temperature 98.7 F (37.1 C), temperature source Oral, resp. rate (!) 22, height 5' 10.5" (1.791 m), weight 82.6 kg, SpO2 95 %.    Vent Mode: PSV;CPAP FiO2 (%):  [30 %-40 %] 40 % Set Rate:  [20 bmp] 20 bmp Vt Set:  [590 mL] 590 mL PEEP:  [5 cmH20] 5 cmH20 Pressure Support:  [12 cmH20-15 cmH20] 12 cmH20 Plateau Pressure:  [22 cmH20-25 cmH20] 25 cmH20   Intake/Output Summary (Last 24 hours) at 12/10/2019 1015 Last data filed at 12/10/2019 0600 Gross per 24 hour  Intake 1150 ml  Output 325 ml  Net 825 ml   Filed Weights   12/09/19 0500 12/10/19 0404 12/10/19 0930  Weight: 81.4 kg 82.6 kg 82.6 kg      Recent Labs    12/08/19 0537 12/10/19 0402  HGB 7.1* 7.7*   Examination:  General: Acute on  chronically ill appearing male, moderate respiratory distress HEENT: [place]/AT, PERRL, EOM-I and MMM, trach in place Neuro: Arousable, opens eyes but not following commands CV: RRR, Nl S1/S2 and -M/R/G PULM: Coarse diffusely GI: Soft, NT, ND and +BS Extremities: RUE edema Skin: Demarcation, wounds bilateral feet   I reviewed CXR myself, pulmonary edema and trach in place  Discussed with  bedside RN and RT  Resolved Hospital Problem list   Ileus Cardiogenic shock   Assessment & Plan:   Cardiac arrest- doubt PE but contrast timing poor, venous duplex negative  Vfib rhythm,  Combined systolic and diastolic CHF, NSVT, prolonged QT Plan Not an ICD candidate per EP, discussed Continue PO amio; if no further VT after 1 week, reduce amiodarone to 400mg  qD  Continue tele Appreciate cardiology assistance and recs   Afib:  Amiodarone  Tele  Continue Noac; not on heparin due to anemia  Consider sq heparin  Acute on chronic respiratory failure  -difficult intubation needing cric in field -now s/p tracheostomy  Plan Will attempt high PS today, doubt TC but will attempt again Needs placement in a vent facility if family continues to wish for full code status  Subsegmental PE:  Apixiban   ESRD Plan Per nephrology Did not tolerate iHD 12/23 due to VT; next attempt will be 12/26   Anxiety As needed Xanax  Hx polysustance abuse- cocaine, alcohol, tobacco Plan Will need cessation counseling   Anemia -chronic illness, critical illness, blood loss related to frequent lab draws Plan Trend CBC prn and transfuse per protocol   Ischemic changes of feet  -due to low flow, obtain vascular input, now off IV heparin. Seen by VVS 12/18 - VVS concerned that patient at risk for amputation of lower extremity and blisters are signaling ischemia. Recommending supportive care and await demarcation. Patient not interested in amputation Plan  Wound care Vascular vein surgery is following  Difficult IV access -At present there is not indication for CVC. If there is an indication, this may need to be evaluated by IR  Plan Recommend changing IV medications to PO  IV team consult   Goals of Care -Palliative Care has been very thoughtful with [place] discussions in setting of decompensated and end-stage chronic conditions and acute/recent critical illness. At present, family wishes  for full scope of treatment. Appreciate Palliative Care continuing these discussions   LABS   PULMONARY No results for input(s): PHART, PCO2ART, PO2ART, HCO3, TCO2, O2SAT in the last 168 hours.  Invalid input(s): PCO2, PO2  CBC Recent Labs  Lab 12/07/19 0246 12/08/19 0537 12/10/19 0402  HGB 7.8* 7.1* 7.7*  HCT 25.0* 22.4* 24.9*  WBC 12.3* 10.8* 12.7*  PLT 415* 393 393    COAGULATION No results for input(s): INR in the last 168 hours.  CARDIAC  No results for input(s): TROPONINI in the last 168 hours. No results for input(s): PROBNP in the last 168 hours.   CHEMISTRY Recent Labs  Lab 12/06/19 0218 12/07/19 0246 12/08/19 0537 12/09/19 0320 12/10/19 0402  NA 134* 130*  131* 132* 132* 133*  K 4.5 5.5*  5.4* 4.6 5.1 5.3*  CL 93* 90*  90* 91* 91* 91*  CO2 25 24  24 25 25 26   GLUCOSE 112* 95  97 108* 99 94  BUN 47* 72*  73* 48* 70* 90*  CREATININE 4.52* 5.80*  5.85* 4.24* 5.46* 6.56*  CALCIUM 7.8* 7.8*  7.8* 8.0* 8.2* 7.9*  MG 1.9 2.1 2.0  --   --  PHOS 2.3* 2.4*  2.4* 1.8* 2.1* 3.2   Estimated Creatinine Clearance: 14 mL/min (A) (by C-G formula based on SCr of 6.56 mg/dL (H)).   LIVER Recent Labs  Lab 12/04/19 0322 12/06/19 0218 12/07/19 0246 12/08/19 0537 12/09/19 0320 12/10/19 0402  AST 16  --   --   --   --  14*  ALT 18  --   --   --   --  14  ALKPHOS 114  --   --   --   --  168*  BILITOT 0.9  --   --   --   --  0.7  PROT 6.5  --   --   --   --  6.7  ALBUMIN 2.2*  2.1* 2.1* 2.1* 2.3* 2.3* 2.3*     INFECTIOUS Recent Labs  Lab 12/04/19 0322  LATICACIDVEN 1.5     ENDOCRINE CBG (last 3)  Recent Labs    12/09/19 2337 12/10/19 0342 12/10/19 0715  GLUCAP 104* 83 83   The patient is critically ill with multiple organ systems failure and requires high complexity decision making for assessment and support, frequent evaluation and titration of therapies, application of advanced monitoring technologies and extensive interpretation of  multiple databases.   Critical Care Time devoted to patient care services described in this note is  32  Minutes. This time reflects time of care of this signee Dr Jennet Maduro. This critical care time does not reflect procedure time, or teaching time or supervisory time of PA/NP/Med student/Med Resident etc but could involve care discussion time.  Rush Farmer, M.D. [facility] Pulmonary/Critical Care Medicine.

## 2019-12-10 NOTE — Progress Notes (Signed)
Progress Note  Patient Name: [name] [name] Date of Encounter: 12/10/2019  Primary Cardiologist: Fransico Him, MD   Subjective   ET tube in place. No acute complaints at this time.  Inpatient Medications    Scheduled Meds: . amiodarone  400 mg Oral BID  . apixaban  2.5 mg Oral BID  . chlorhexidine gluconate (MEDLINE KIT)  15 mL Mouth Rinse BID  . Chlorhexidine Gluconate Cloth  6 each Topical Q0600  . Chlorhexidine Gluconate Cloth  6 each Topical Q0600  . darbepoetin (ARANESP) injection - DIALYSIS  100 mcg Intravenous Q Wed-HD  . docusate  100 mg Per Tube Daily  . feeding supplement (PRO-STAT SUGAR FREE 64)  30 mL Per Tube TID  . insulin aspart  0-9 Units Subcutaneous Q4H  . mouth rinse  15 mL Mouth Rinse 10 times per day  . midodrine  10 mg Per Tube Q8H  . multivitamin  1 tablet Oral QHS  . pantoprazole sodium  40 mg Per Tube QHS  . phosphorus  250 mg Oral BID  . sodium chloride flush  10-40 mL Intracatheter Q12H  . sodium chloride flush  10-40 mL Intracatheter Q12H  . white petrolatum   Topical Daily   Continuous Infusions: . sodium chloride Stopped (12/01/19 1424)  . feeding supplement (VITAL 1.5 CAL) 1,000 mL (12/10/19 0530)   PRN Meds: sodium chloride, acetaminophen (TYLENOL) oral liquid 160 mg/5 mL, acetaminophen, ALPRAZolam, fentaNYL (SUBLIMAZE) injection, oxyCODONE, prochlorperazine, sodium chloride flush, sodium chloride flush   Vital Signs    Vitals:   12/10/19 0600 12/10/19 0700 12/10/19 0748 12/10/19 0751  BP: 111/73   116/68  Pulse:      Resp: 20   (!) 22  Temp:  98.6 F (37 C)    TempSrc:  Oral    SpO2:   (!) 86% 95%  Weight:      Height:        Intake/Output Summary (Last 24 hours) at 12/10/2019 0814 Last data filed at 12/10/2019 0600 Gross per 24 hour  Intake 1400 ml  Output 325 ml  Net 1075 ml   Filed Weights   12/08/19 0500 12/09/19 0500 12/10/19 0404  Weight: 81 kg 81.4 kg 82.6 kg    Telemetry    Atrial fibrillation with  controlled ventricular rate- Personally Reviewed  ECG    None- Personally Reviewed  Physical Exam   GEN: ET tube in place. No acute distress. HEENT: normal  Neck: no JVD, carotid bruits, or masses Cardiac: Irregular; no murmurs, rubs, or gallops,no edema  Respiratory:  clear to auscultation bilaterally, normal work of breathing GI: soft, nontender, nondistended, + BS MS: no deformity or atrophy  Skin: warm and dry Neuro:  Strength and sensation are intact Psych: euthymic mood, full affect   Labs    Chemistry Recent Labs  Lab 12/04/19 0322 12/08/19 0537 12/09/19 0320 12/10/19 0402  NA 132* 132* 132* 133*  K 4.3 4.6 5.1 5.3*  CL 92* 91* 91* 91*  CO2 _0 GLUCOSE 113* 108* 99 94  BUN 65* 48* 70* 90*  CREATININE 5.37* 4.24* 5.46* 6.56*  CALCIUM 7.4* 8.0* 8.2* 7.9*  PROT 6.5  --   --  6.7  ALBUMIN 2.2*  2.1* 2.3* 2.3* 2.3*  AST 16  --   --  14*  ALT 18  --   --  14  ALKPHOS 114  --   --  168*  BILITOT 0.9  --   --  0.7  GFRNONAA 11* 15* 11* 9*  GFRAA 13* 18* 13* 10*  ANIONGAP 16* 16* 16* 16*     Hematology Recent Labs  Lab 12/07/19 0246 12/08/19 0537 12/10/19 0402  WBC 12.3* 10.8* 12.7*  RBC 2.72* 2.45* 2.78*  HGB 7.8* 7.1* 7.7*  HCT 25.0* 22.4* 24.9*  MCV 91.9 91.4 89.6  MCH 28.7 29.0 27.7  MCHC 31.2 31.7 30.9  RDW 17.8* 17.6* 17.3*  PLT 415* 393 393    Cardiac EnzymesNo results for input(s): TROPONINI in the last 168 hours. No results for input(s): TROPIPOC in the last 168 hours.   BNPNo results for input(s): BNP, PROBNP in the last 168 hours.   DDimer No results for input(s): DDIMER in the last 168 hours.   Radiology    No results found.  Cardiac Studies     Patient Profile     53 y.o. male admitted with a cardiac arrest, ESRD, emergent, now revised tracheostomy, atrial fib.  Assessment & Plan    1. VT/VF -no recurrent prolonged ventricular arrhythmias. Continue amiodarone orally down the NG tube.  2. Atrial fib -ventricular  rates remain controlled. Continue with rate control with amiodarone and digoxin. 3. VDRF -slow weaning but ongoing. Unclear as to whether or not tracheostomy tube can be removed. Not a candidate for ICD therapy with tracheostomy in place.     For questions or updates, please contact [place] Please consult [email or web address] for contact info under Cardiology/STEMI.   Signed, Spirit Wernli Meredith Leeds, MD  12/10/2019, 8:14 AM  Patient ID: [name] [name], male   DOB: 1966-10-23, 53 y.o.   MRN: [id number]

## 2019-12-10 NOTE — Progress Notes (Signed)
PROGRESS NOTE    [name] [name]  XIP:[id number] DOB: [birth year] DOA: 11/16/2019 PCP: Jean Rosenthal, MD   Brief Narrative:  53 year old man with PMH of HTN, CHF, Alcohol use, Substance use, ESRD on HD is admitted with PEA arrest at HD unit. Immediately witnessed and CPR with ROSC at 15 minutes Emergent cricothyrotomy performed by EMS due to failed intubation with [facility] airway.  12/08/19 Palliative care met with family and discussed options with pt. At this time, they are open to all offered and available medical interventions to prolong life.  Today 12/10/19: Goal still to get him weaned off vent failed trial yesterday am. Pt denies pain. Legs ok, Per VVS, just watching. Good doppler signals at bedside today. Going for Dialysis today due to holiday schedule.  Assessment & Plan:   S/P Cardiac arrest. V fib/arrythmias. In the setting of combined systolic and diastolic CHF, NSVT, prolonged QT -Transition to p.o. amiodarone. Not a candidate for internal cardiac defibrillator per EP. Cont monitoring on tele  EF <20%  A fib: HR is controlled. On amiodarone p.o. Pt was briefly on iv heparin. Then stopped due to low hemoglobin, restarted on apixaban at 2.5 mg by PCCM on 12/23  Acute on chronic respiratory failure, difficult intubation needing cric- s/p Trach .  -remains on vent. Cont per pulmonology, weaning is goal and continues today. At times becomes anxious and fails to completely wean.  + H. Flu 12/4  Subsegmental PE:. Pt was briefly on iv heparin. Then stopped due to low hemoglobin, -restarted on apixaban at 2.5 mg by PCCM on 12/23.   ESRD-intermittent HD. Cont per nephrology--for HD today   Hx polysustance abuse- cocaine, alcohol, tobacco. Monitor no s/s of etoh withdrawals   Ileus-resolved as documented by KUB, Abdomen is soft and non-tender today  Anemia normocytic. No s/s of acute bleeding. OK to continue Eliquis for now. Monitor Hg, occult stool blood test prn. Hgb 7.7  stable.  Ischemic changes of feet -due to low flow, Seen by VVS 12/18 - VVS consult concerned that patient is at risk for amputation of lower extremity and blisters are signaling ischemia. Recommending supportive care and await demarcation. Patient not interested in amputation  Superficial vein Thrombus - 12/22 on Eliquis-swelling continues  DVT prophylaxis: anticoagulated Code Status: Full  Family Communication: Discussed with patient Disposition Plan: remains inpatient, full treatment options for now  Consultants:  Nephrology Cardiology Pulmonary ENT [place] VVS Palliative care PT  Procedures:  12/25 Midline 12/10-trach 12/4 RT fem HD cath >> out 12/2 RT [place] CVL >>  Antimicrobials:             Anti-infectives (From admission, onward)  '[]'$ Expand by [employer]     Dose/Rate Route Frequency Ordered Stop   11/22/19 1800  cefTRIAXone (ROCEPHIN) 2 g in sodium chloride 0.9 % 100 mL IVPB     2 g 200 mL/hr over 30 Minutes Intravenous Every 24 hours 11/22/19 0723 11/27/19 1820   11/18/19 1300  Ampicillin-Sulbactam (UNASYN) 3 g in sodium chloride 0.9 % 100 mL IVPB  Status:  Discontinued     3 g 200 mL/hr over 30 Minutes Intravenous Every 8 hours 11/18/19 1230 11/22/19 0722   11/18/19 1200  ampicillin-sulbactam (UNASYN) 1.5 g in sodium chloride 0.9 % 100 mL IVPB  Status:  Discontinued     1.5 g 200 mL/hr over 30 Minutes Intravenous Every 12 hours 11/18/19 1150 11/18/19 1230        Subjective: On vent, alert, cooperative,  appears happy, no issues this am.  Objective: Vitals:   12/10/19 0600 12/10/19 0700 12/10/19 0748 12/10/19 0751  BP: 111/73   116/68  Pulse:      Resp: 20   (!) 22  Temp:  98.6 F (37 C)    TempSrc:  Oral    SpO2:   (!) 86% 95%  Weight:      Height:        Intake/Output Summary (Last 24 hours) at 12/10/2019 0839 Last data filed at 12/10/2019 0600 Gross per 24 hour  Intake 1400 ml  Output 325 ml  Net 1075 ml   Filed Weights    12/08/19 0500 12/09/19 0500 12/10/19 0404  Weight: 81 kg 81.4 kg 82.6 kg    Examination:  General exam: Appears calm and comfortable  Respiratory system: Clear to auscultation. Respiratory effort normal. Cardiovascular system: S1 & S2 heard, irregular rhythem.  Gastrointestinal system: Abdomen is nondistended, soft and nontender.  Central nervous system: Alert and oriented. No focal neurological deficits. Extremities: bandages in place, some darkness at left great toe, good doppler flow at [facility], moves all extremities, he has swelling in his RUE with a known superficial clot and midline placed yesterday Skin: bandages in place  Data Reviewed: I have personally reviewed following labs and imaging studies  CBC: Recent Labs  Lab 12/04/19 0322 12/05/19 0215 12/06/19 0218 12/07/19 0246 12/08/19 0537 12/10/19 0402  WBC 13.7* 14.7* 10.9* 12.3* 10.8* 12.7*  NEUTROABS 10.7* 11.1* 8.3* 9.3* 8.3*  --   HGB 7.6* 8.1* 7.2* 7.8* 7.1* 7.7*  HCT 23.1* 24.8* 22.8* 25.0* 22.4* 24.9*  MCV 89.5 89.5 91.2 91.9 91.4 89.6  PLT 457* 476* 417* 415* 393 244   Basic Metabolic Panel: Recent Labs  Lab 12/06/19 0218 12/07/19 0246 12/08/19 0537 12/09/19 0320 12/10/19 0402  NA 134* 130*  131* 132* 132* 133*  K 4.5 5.5*  5.4* 4.6 5.1 5.3*  CL 93* 90*  90* 91* 91* 91*  CO2 25 24  24 25 25 26   GLUCOSE 112* 95  97 108* 99 94  BUN 47* 72*  73* 48* 70* 90*  CREATININE 4.52* 5.80*  5.85* 4.24* 5.46* 6.56*  CALCIUM 7.8* 7.8*  7.8* 8.0* 8.2* 7.9*  MG 1.9 2.1 2.0  --   --   PHOS 2.3* 2.4*  2.4* 1.8* 2.1* 3.2   GFR: Estimated Creatinine Clearance: 14 mL/min (A) (by C-G formula based on SCr of 6.56 mg/dL (H)). Liver Function Tests: Recent Labs  Lab 12/04/19 0322 12/06/19 0218 12/07/19 0246 12/08/19 0537 12/09/19 0320 12/10/19 0402  AST 16  --   --   --   --  14*  ALT 18  --   --   --   --  14  ALKPHOS 114  --   --   --   --  168*  BILITOT 0.9  --   --   --   --  0.7  PROT 6.5  --   --    --   --  6.7  ALBUMIN 2.2*  2.1* 2.1* 2.1* 2.3* 2.3* 2.3*   No results for input(s): LIPASE, AMYLASE in the last 168 hours. No results for input(s): AMMONIA in the last 168 hours. Coagulation Profile: No results for input(s): INR, PROTIME in the last 168 hours. Cardiac Enzymes: No results for input(s): CKTOTAL, CKMB, CKMBINDEX, TROPONINI in the last 168 hours. BNP (last 3 results) No results for input(s): PROBNP in the last 8760 hours. HbA1C: No results for  input(s): HGBA1C in the last 72 hours. CBG: Recent Labs  Lab 12/09/19 1516 12/09/19 1924 12/09/19 2337 12/10/19 0342 12/10/19 0715  GLUCAP 99 97 104* 83 83   Lipid Profile: No results for input(s): CHOL, HDL, LDLCALC, TRIG, CHOLHDL, LDLDIRECT in the last 72 hours. Thyroid Function Tests: No results for input(s): TSH, T4TOTAL, FREET4, T3FREE, THYROIDAB in the last 72 hours. Anemia Panel: No results for input(s): VITAMINB12, FOLATE, FERRITIN, TIBC, IRON, RETICCTPCT in the last 72 hours. Urine analysis:    Component Value Date/Time   COLORURINE YELLOW 04/19/2014 0109   APPEARANCEUR CLEAR 04/19/2014 0109   LABSPEC 1.012 04/19/2014 0109   PHURINE 7.0 04/19/2014 0109   GLUCOSEU NEGATIVE 04/19/2014 0109   HGBUR SMALL (A) 04/19/2014 0109   BILIRUBINUR NEGATIVE 04/19/2014 0109   KETONESUR NEGATIVE 04/19/2014 0109   PROTEINUR 100 (A) 04/19/2014 0109   UROBILINOGEN 0.2 04/19/2014 0109   NITRITE NEGATIVE 04/19/2014 0109   LEUKOCYTESUR TRACE (A) 04/19/2014 0109   Scheduled Meds: . amiodarone  400 mg Oral BID  . apixaban  2.5 mg Oral BID  . chlorhexidine gluconate (MEDLINE KIT)  15 mL Mouth Rinse BID  . Chlorhexidine Gluconate Cloth  6 each Topical Q0600  . Chlorhexidine Gluconate Cloth  6 each Topical Q0600  . darbepoetin (ARANESP) injection - DIALYSIS  100 mcg Intravenous Q Wed-HD  . docusate  100 mg Per Tube Daily  . feeding supplement (PRO-STAT SUGAR FREE 64)  30 mL Per Tube TID  . insulin aspart  0-9 Units  Subcutaneous Q4H  . mouth rinse  15 mL Mouth Rinse 10 times per day  . midodrine  10 mg Per Tube Q8H  . multivitamin  1 tablet Oral QHS  . pantoprazole sodium  40 mg Per Tube QHS  . phosphorus  250 mg Oral BID  . sodium chloride flush  10-40 mL Intracatheter Q12H  . sodium chloride flush  10-40 mL Intracatheter Q12H  . white petrolatum   Topical Daily   Continuous Infusions: . sodium chloride Stopped (12/01/19 1424)  . feeding supplement (VITAL 1.5 CAL) 1,000 mL (12/10/19 0530)     LOS: 24 days    Time spent: 35 minutes  Donnamae Jude, MD [facility] Pager [phone]  If 7PM-7AM, please contact night-coverage [email or web address] Password [facility] 12/10/2019, 8:39 AM

## 2019-12-11 DIAGNOSIS — J9691 Respiratory failure, unspecified with hypoxia: Secondary | ICD-10-CM

## 2019-12-11 LAB — RENAL FUNCTION PANEL
Albumin: 2.3 g/dL — ABNORMAL LOW (ref 3.5–5.0)
Anion gap: 14 (ref 5–15)
BUN: 56 mg/dL — ABNORMAL HIGH (ref 6–20)
CO2: 26 mmol/L (ref 22–32)
Calcium: 8.1 mg/dL — ABNORMAL LOW (ref 8.9–10.3)
Chloride: 94 mmol/L — ABNORMAL LOW (ref 98–111)
Creatinine, Ser: 4.49 mg/dL — ABNORMAL HIGH (ref 0.61–1.24)
GFR calc Af Amer: 16 mL/min — ABNORMAL LOW (ref >60)
GFR calc non Af Amer: 14 mL/min — ABNORMAL LOW (ref >60)
Glucose, Bld: 107 mg/dL — ABNORMAL HIGH (ref 70–99)
Phosphorus: 2.8 mg/dL (ref 2.5–4.6)
Potassium: 4.5 mmol/L (ref 3.5–5.1)
Sodium: 134 mmol/L — ABNORMAL LOW (ref 135–145)

## 2019-12-11 LAB — GLUCOSE, CAPILLARY
Glucose-Capillary: 100 mg/dL — ABNORMAL HIGH (ref 70–99)
Glucose-Capillary: 103 mg/dL — ABNORMAL HIGH (ref 70–99)
Glucose-Capillary: 106 mg/dL — ABNORMAL HIGH (ref 70–99)
Glucose-Capillary: 109 mg/dL — ABNORMAL HIGH (ref 70–99)
Glucose-Capillary: 96 mg/dL (ref 70–99)
Glucose-Capillary: 99 mg/dL (ref 70–99)

## 2019-12-11 MED ORDER — AMIODARONE HCL 200 MG PO TABS
400.0000 mg | ORAL_TABLET | Freq: Two times a day (BID) | ORAL | Status: DC
  Administered 2019-12-12 – 2019-12-14 (×5): 400 mg
  Filled 2019-12-11 (×5): qty 2

## 2019-12-11 NOTE — Progress Notes (Signed)
Patient ID: [name] [name], male   DOB: [birth year], 53 y.o.   MRN: [id number]  [facility] Progress Note   Assessment/ Plan:   1. Status post PEA and V. tach arrest: Cardiogenic shock resolved, remains on amiodarone and Eliquis.  No recurrence of ventricular tachycardia yesterday with dialysis.  Not candidate for ICD with tracheostomy in place as well as previous concerns of adherence/drug use. 2. ESRD: He is usually on a Monday/Wednesday/Friday schedule and underwent hemodialysis yesterday per holiday schedule-he will get back onto his usual schedule with hemodialysis tomorrow.  Euvolemic on exam. 3. Anemia: Without overt blood loss, low hemoglobin/hematocrit noted with last Aranesp dose on 12/22. 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 5. Nutrition: Ongoing tube feeds at this time, restart renal multivitamin. 6. Bilateral lower extremity ischemia: Currently on anticoagulation with apixaban and ongoing close evaluation and management by vascular surgery with brisk lower extremity Doppler signals noted; allowing demarcation to decide on need for surgical intervention.  Subjective:   Without acute events overnight, tolerated hemodialysis without problem/V. tach yesterday.   Objective:   BP 115/60   Pulse 82   Temp 99.8 F (37.7 C) (Oral)   Resp 18   Ht 5' 10.5" (1.791 m)   Wt 82.2 kg   SpO2 100%   BMI 25.63 kg/m   Physical Exam: Gen: Appears comfortable resting in bed, tracheostomy in place, NGT in situ. CVS: Pulse regular rhythm, normal rate, S1 and S2 normal Resp: Anteriorly clear to auscultation, no rales/rhonchi.  Asymmetric left neck/upper chest swelling that is soft Abd: Soft, mildly distended, bowel sounds normal Ext: Trace lower extremity edema, bilateral leg/ankle wrapped in clean gauze.  Ischemic changes of digits noted.  Left upper arm AV fistula with hyperpulsatility, intact dressings.  Labs: BMET Recent Labs   Lab 12/05/19 0215 12/06/19 0218 12/07/19 0246 12/08/19 0537 12/09/19 0320 12/10/19 0402 12/11/19 0302  NA 131* 134* 130*  131* 132* 132* 133* 134*  K 5.1 4.5 5.5*  5.4* 4.6 5.1 5.3* 4.5  CL 92* 93* 90*  90* 91* 91* 91* 94*  CO2 24 25 24  24 25 25 26 26   GLUCOSE 116* 112* 95  97 108* 99 94 107*  BUN 85* 47* 72*  73* 48* 70* 90* 56*  CREATININE 6.65* 4.52* 5.80*  5.85* 4.24* 5.46* 6.56* 4.49*  CALCIUM 7.4* 7.8* 7.8*  7.8* 8.0* 8.2* 7.9* 8.1*  PHOS 2.8 2.3* 2.4*  2.4* 1.8* 2.1* 3.2 2.8   CBC Recent Labs  Lab 12/05/19 0215 12/06/19 0218 12/07/19 0246 12/08/19 0537 12/10/19 0402  WBC 14.7* 10.9* 12.3* 10.8* 12.7*  NEUTROABS 11.1* 8.3* 9.3* 8.3*  --   HGB 8.1* 7.2* 7.8* 7.1* 7.7*  HCT 24.8* 22.8* 25.0* 22.4* 24.9*  MCV 89.5 91.2 91.9 91.4 89.6  PLT 476* 417* 415* 393 393      Medications:    . amiodarone  400 mg Oral BID  . apixaban  2.5 mg Oral BID  . chlorhexidine gluconate (MEDLINE KIT)  15 mL Mouth Rinse BID  . Chlorhexidine Gluconate Cloth  6 each Topical Q0600  . darbepoetin (ARANESP) injection - DIALYSIS  100 mcg Intravenous Q Wed-HD  . docusate  100 mg Per Tube Daily  . feeding supplement (PRO-STAT SUGAR FREE 64)  30 mL Per Tube TID  . insulin aspart  0-9 Units Subcutaneous Q4H  . mouth rinse  15 mL Mouth Rinse 10 times per day  . midodrine  10 mg Per Tube Q8H  .  multivitamin  1 tablet Oral QHS  . pantoprazole sodium  40 mg Per Tube QHS  . sodium chloride flush  10-40 mL Intracatheter Q12H  . sodium chloride flush  10-40 mL Intracatheter Q12H  . white petrolatum   Topical Daily   Elmarie Shiley, MD 12/11/2019, 7:22 AM

## 2019-12-11 NOTE — Progress Notes (Signed)
NAME:  Bassem Bernasconi, MRN:  [id number], DOB:  1966-08-27, LOS: 57 ADMISSION DATE:  11/16/2019, CONSULTATION DATE:  12/2 REFERRING MD:  Dr. Sedonia Small, CHIEF COMPLAINT:  Post cardiac arrest    Brief History   53yo male presented post PEA arrest which occurred during iHD treatment as outpatient. This was a witnessed arrest with quick bystander CPR. Received 3 rounds of epi, 1g of calcium and 50 mEq of bicarb with ROSC after 15 minutes.   Emergent cricothyrotomy performed by EMS due to failed intubation with [facility] airway.   Past Medical History  NSVT 2017 Nonischemic cardiomyopathy Medical noncompliance Polysubstance abuse Hypertension End-stage renal disease Combined systolic and diastolic congestive heart failure Cocaine abuse Alcohol abuse Prolonged QTC  Significant Hospital Events   12/2 Admitted post cardiac arrest, emergent cric, 33 C TTM 12/8 V. tach arrest 12/10 Cric changed to tracheostomy 12/16 HD, tolerated only 30 minutes of trach collar 12/18 - He is critically ill, remains on vent. Soft blood pressure overnight but off pressors Afebrile. Rx HD 12/19 -  MAP > 65 with slow SBP. Doing PSV via trach. EP wants amio gtt cntinued through 12/21 and then decide on Dc. RN concerned about bilsters on feet and knees. She has called wound care. Not on sedation gtt. Following commands/. No active bleeding. Diarrhea + per RN 12/20: VVS recommends continued monitoring of LE and allowed necrosis to demarcate. Patient reportedly opposed to idea of amputaiton. Failed ATC and back on full vent. Amio gtt cotinues.  Afebrile.  Renam planning repeat HD v CRRT with pressor support 12/10/19 based on BP. Denies complaints other than LE pain. On TF 12/06/2019 complains of any pain with manipulation of lower extremities.  Currently on full mechanical ventilatory support.  No acute distress 12/23 did not tolerate wean well 12/24 Not tolerating wean, no events overnight 12/09/2019 -called by RN for  increased anxiety when attempted to come off vent on ATC. Pt with increased wob on atc, getting dialysis at this time.   Consults:  Nephrology Electrophysiology ENT [place] 12/18   Procedures:  12/10-trach 12/4 RT fem HD cath >> out 12/2 RT [place] CVL >>  Significant Diagnostic Tests:  CTA 12/2-segmental filling defects in the right middle lobe and right lower lobe pulmonary arteries, pneumomediastinum, subcu emphysema CT head 12/8-no acute abnormality  Echocardiogram 11/17/19-LVEF less than 20%, normal LV systolic function  Micro Data:  COVID 12/2 > negative Aspirate 12/04+ for Haemophilus influenza  12/11 Bc >> ng  Antimicrobials:  Unasyn 12/4>>12/7 Ceftriazone 12/7>>12/13  Interim history/subjective:  Failed CPAP/PS and required placement back on PRVC virtually immediately yesterday and failed again this morning  Objective   Blood pressure 115/60, pulse 82, temperature 98.6 F (37 C), temperature source Oral, resp. rate 18, height 5' 10.5" (1.791 m), weight 82.2 kg, SpO2 100 %.    Vent Mode: PRVC FiO2 (%):  [30 %-40 %] 40 % Set Rate:  [20 bmp] 20 bmp Vt Set:  [590 mL] 590 mL PEEP:  [5 cmH20] 5 cmH20 Pressure Support:  [12 cmH20] 12 cmH20 Plateau Pressure:  [18 cmH20-24 cmH20] 24 cmH20   Intake/Output Summary (Last 24 hours) at 12/11/2019 0737 Last data filed at 12/11/2019 0600 Gross per 24 hour  Intake 1230 ml  Output 1300 ml  Net -70 ml   Filed Weights   12/10/19 0930 12/10/19 1340 12/11/19 0000  Weight: 84.1 kg 83 kg 82.2 kg   Recent Labs    12/10/19 0402  HGB 7.7*   Examination:  General:  Acute on chronic ill appearing male, moderate respiratory distress when changed to 5/5 this AM HEENT: [place]/AT, PERRL, EOM-I and MMM, trach is in a good position Neuro: Arousable and follows some commands but sluggish, non-focal CV: RRR, Nl S1/S2 and -M/R/G PULM: Coarse diffusely GI: Soft, NT, ND and +BS Extremities: RUE edema Skin: Demarcation, wounds bilateral feet    I reviewed CXR myself, trach is in a good position  Discussed with bedside RN and RT  Resolved Hospital Problem list   Ileus Cardiogenic shock   Assessment & Plan:   Cardiac arrest- doubt PE but contrast timing poor, venous duplex negative  VF arrest Combined systolic and diastolic CHF, NSVT, prolonged QT Plan Not a candidate for ICD per EP, may need to reconsider, will question after christmas weekend Continue PO amio; if no further VT after 1 week, reduce amiodarone to 400mg  qD  Tele monitoring Full code Appreciate cardiology assistance and recs   Afib:  Amiodarone  Tele  Continue Noac; not on heparin due to anemia  Consider sq heparin  Acute on chronic respiratory failure  -difficult intubation needing cric in field -now s/p tracheostomy  Plan Will attempt high PS today, doubt TC but will attempt again Patient is not weaning well, will require a vent/dialysis facility, will consult CSW on Monday  Subsegmental PE:  Apixiban   ESRD Plan Per nephrology Tolerated iHD on 12/25, plan again on Monday  BMET in AM Replace electrolytes as indicated  Anxiety As needed Xanax  Hx polysustance abuse- cocaine, alcohol, tobacco Plan Will need cessation counseling but doubt will continue to be an issue given current condition  Anemia -chronic illness, critical illness, blood loss related to frequent lab draws Plan Trend CBC prn and transfuse per protocol  CBC in AM  Ischemic changes of feet  -due to low flow, obtain vascular input, now off IV heparin. Seen by VVS 12/18 - VVS concerned that patient at risk for amputation of lower extremity and blisters are signaling ischemia. Recommending supportive care and await demarcation. Patient not interested in amputation Plan  Wound care involved Vascular vein surgery is following, per note awaiting full demarcation prior to decision on amputation  Difficult IV access -At present there is not indication for CVC. If there  is an indication, this may need to be evaluated by IR  Plan Change all meds to PO IV team consult appreciated  Goals of Care -Palliative Care has been very thoughtful with [place] discussions in setting of decompensated and end-stage chronic conditions and acute/recent critical illness. At present, family wishes for full scope of treatment. Appreciate Palliative Care continuing these discussions   LABS   PULMONARY No results for input(s): PHART, PCO2ART, PO2ART, HCO3, TCO2, O2SAT in the last 168 hours.  Invalid input(s): PCO2, PO2  CBC Recent Labs  Lab 12/07/19 0246 12/08/19 0537 12/10/19 0402  HGB 7.8* 7.1* 7.7*  HCT 25.0* 22.4* 24.9*  WBC 12.3* 10.8* 12.7*  PLT 415* 393 393    COAGULATION No results for input(s): INR in the last 168 hours.  CARDIAC  No results for input(s): TROPONINI in the last 168 hours. No results for input(s): PROBNP in the last 168 hours.   CHEMISTRY Recent Labs  Lab 12/06/19 0218 12/07/19 0246 12/08/19 0537 12/09/19 0320 12/10/19 0402 12/10/19 1103 12/11/19 0302  NA 134* 130*  131* 132* 132* 133*  --  134*  K 4.5 5.5*  5.4* 4.6 5.1 5.3*  --  4.5  CL 93* 90*  90* 91* 91*  91*  --  94*  CO2 25 24  24 25 25 26   --  26  GLUCOSE 112* 95  97 108* 99 94  --  107*  BUN 47* 72*  73* 48* 70* 90*  --  56*  CREATININE 4.52* 5.80*  5.85* 4.24* 5.46* 6.56*  --  4.49*  CALCIUM 7.8* 7.8*  7.8* 8.0* 8.2* 7.9*  --  8.1*  MG 1.9 2.1 2.0  --   --  2.0  --   PHOS 2.3* 2.4*  2.4* 1.8* 2.1* 3.2  --  2.8   Estimated Creatinine Clearance: 20.4 mL/min (A) (by C-G formula based on SCr of 4.49 mg/dL (H)).   LIVER Recent Labs  Lab 12/07/19 0246 12/08/19 0537 12/09/19 0320 12/10/19 0402 12/11/19 0302  AST  --   --   --  14*  --   ALT  --   --   --  14  --   ALKPHOS  --   --   --  168*  --   BILITOT  --   --   --  0.7  --   PROT  --   --   --  6.7  --   ALBUMIN 2.1* 2.3* 2.3* 2.3* 2.3*     INFECTIOUS No results for input(s): LATICACIDVEN,  PROCALCITON in the last 168 hours.   ENDOCRINE CBG (last 3)  Recent Labs    12/10/19 2337 12/11/19 0400 12/11/19 0728  GLUCAP 88 96 109*   The patient is critically ill with multiple organ systems failure and requires high complexity decision making for assessment and support, frequent evaluation and titration of therapies, application of advanced monitoring technologies and extensive interpretation of multiple databases.   Critical Care Time devoted to patient care services described in this note is  31  Minutes. This time reflects time of care of this signee Dr Jennet Maduro. This critical care time does not reflect procedure time, or teaching time or supervisory time of PA/NP/Med student/Med Resident etc but could involve care discussion time.  Rush Farmer, M.D. [facility] Pulmonary/Critical Care Medicine.

## 2019-12-11 NOTE — Progress Notes (Signed)
PROGRESS NOTE    [name] [name]  YDX:[id number] DOB: [birth year] DOA: 11/16/2019 PCP: Jean Rosenthal, MD   Brief Narrative:  53 year old man with PMH of HTN, CHF, Alcohol use, Substance use, ESRD on HD is admitted with PEA arrest at HD unit. Immediately witnessed and CPR with ROSC at 15 minutes Emergent cricothyrotomy performed by EMS due to failed intubation with [facility] airway. 12/08/19 Palliative care met with family and discussed options with pt. At this time, they are open to all offered and available medical interventions to prolong life. Tx to [facility] on 12/23?   Assessment & Plan:   S/P Cardiac arrest. V fib/arrythmias. In the setting of combined systolic and diastolic CHF, NSVT, prolonged QT -Transitioned to  amiodarone. Not a candidate for internal cardiac defibrillator per EP. Cont monitoring on tele  EF <20%  A fib: chronic  -amiodarone p.o.  -apixaban at 2.5 mg by PCCM on 12/23  Acute respiratory failure with hypoxia, difficult intubation needing cric- s/p Trach .  -remains on vent. Cont per pulmonology: At times becomes anxious and fails to completely wean.  + H. Flu 12/4  Subsegmental PE: -apixaban at 2.5 mg by PCCM on 12/23.   ESRD- -HD per renal -M/W/F outpatient   Hx polysustance abuse- cocaine, alcohol, tobacco. Monitor no s/s of etoh withdrawals   Ileus -monitor closely (? Slight distention on exam) +BS today  Anemia normocytic.  -trend -transfuse for <7  Ischemic changes of feet  -due to low flow, Seen by VVS 12/18 - VVS consult concerned that patient is at risk for amputation of lower extremity and blisters are signaling ischemia. Recommending supportive care and await demarcation. Patient not interested in amputation  Superficial vein Thrombus - 12/22 on Eliquis   DVT prophylaxis: aeliquis Code Status: Full  Family Communication: Discussed with patient Disposition Plan: remains inpatient  Consultants:   Nephrology Cardiology Pulmonary ENT [place] VVS Palliative care     Subjective: Having some anxiety this AM  Objective: Vitals:   12/11/19 0700 12/11/19 0756 12/11/19 0800 12/11/19 1033  BP:  (!) 111/55 110/72   Pulse:  84 79   Resp:  (!) 24 (!) 23 (!) 26  Temp: 98.6 F (37 C)     TempSrc: Oral     SpO2:  96% 99% 91%  Weight:      Height:        Intake/Output Summary (Last 24 hours) at 12/11/2019 1312 Last data filed at 12/11/2019 0800 Gross per 24 hour  Intake 1030 ml  Output 1300 ml  Net -270 ml   Filed Weights   12/10/19 0930 12/10/19 1340 12/11/19 0000  Weight: 84.1 kg 83 kg 82.2 kg    Examination: In bed, anxious appearing +BS, slight distention but soft Legs b/l wrapped in guaze Trach in place as well as NG tube  Data Reviewed: I have personally reviewed following labs and imaging studies  CBC: Recent Labs  Lab 12/05/19 0215 12/06/19 0218 12/07/19 0246 12/08/19 0537 12/10/19 0402  WBC 14.7* 10.9* 12.3* 10.8* 12.7*  NEUTROABS 11.1* 8.3* 9.3* 8.3*  --   HGB 8.1* 7.2* 7.8* 7.1* 7.7*  HCT 24.8* 22.8* 25.0* 22.4* 24.9*  MCV 89.5 91.2 91.9 91.4 89.6  PLT 476* 417* 415* 393 720   Basic Metabolic Panel: Recent Labs  Lab 12/06/19 0218 12/07/19 0246 12/08/19 0537 12/09/19 0320 12/10/19 0402 12/10/19 1103 12/11/19 0302  NA 134* 130*  131* 132* 132* 133*  --  134*  K 4.5 5.5*  5.4*  4.6 5.1 5.3*  --  4.5  CL 93* 90*  90* 91* 91* 91*  --  94*  CO2 25 24  24 25 25 26   --  26  GLUCOSE 112* 95  97 108* 99 94  --  107*  BUN 47* 72*  73* 48* 70* 90*  --  56*  CREATININE 4.52* 5.80*  5.85* 4.24* 5.46* 6.56*  --  4.49*  CALCIUM 7.8* 7.8*  7.8* 8.0* 8.2* 7.9*  --  8.1*  MG 1.9 2.1 2.0  --   --  2.0  --   PHOS 2.3* 2.4*  2.4* 1.8* 2.1* 3.2  --  2.8   GFR: Estimated Creatinine Clearance: 20.4 mL/min (A) (by C-G formula based on SCr of 4.49 mg/dL (H)). Liver Function Tests: Recent Labs  Lab 12/07/19 0246 12/08/19 0537 12/09/19 0320  12/10/19 0402 12/11/19 0302  AST  --   --   --  14*  --   ALT  --   --   --  14  --   ALKPHOS  --   --   --  168*  --   BILITOT  --   --   --  0.7  --   PROT  --   --   --  6.7  --   ALBUMIN 2.1* 2.3* 2.3* 2.3* 2.3*   No results for input(s): LIPASE, AMYLASE in the last 168 hours. No results for input(s): AMMONIA in the last 168 hours. Coagulation Profile: No results for input(s): INR, PROTIME in the last 168 hours. Cardiac Enzymes: No results for input(s): CKTOTAL, CKMB, CKMBINDEX, TROPONINI in the last 168 hours. BNP (last 3 results) No results for input(s): PROBNP in the last 8760 hours. HbA1C: No results for input(s): HGBA1C in the last 72 hours. CBG: Recent Labs  Lab 12/10/19 1928 12/10/19 2337 12/11/19 0400 12/11/19 0728 12/11/19 1148  GLUCAP 104* 88 96 109* 103*   Lipid Profile: No results for input(s): CHOL, HDL, LDLCALC, TRIG, CHOLHDL, LDLDIRECT in the last 72 hours. Thyroid Function Tests: No results for input(s): TSH, T4TOTAL, FREET4, T3FREE, THYROIDAB in the last 72 hours. Anemia Panel: No results for input(s): VITAMINB12, FOLATE, FERRITIN, TIBC, IRON, RETICCTPCT in the last 72 hours. Urine analysis:    Component Value Date/Time   COLORURINE YELLOW 04/19/2014 0109   APPEARANCEUR CLEAR 04/19/2014 0109   LABSPEC 1.012 04/19/2014 0109   PHURINE 7.0 04/19/2014 0109   GLUCOSEU NEGATIVE 04/19/2014 0109   HGBUR SMALL (A) 04/19/2014 0109   BILIRUBINUR NEGATIVE 04/19/2014 0109   KETONESUR NEGATIVE 04/19/2014 0109   PROTEINUR 100 (A) 04/19/2014 0109   UROBILINOGEN 0.2 04/19/2014 0109   NITRITE NEGATIVE 04/19/2014 0109   LEUKOCYTESUR TRACE (A) 04/19/2014 0109   Scheduled Meds: . amiodarone  400 mg Oral BID  . apixaban  2.5 mg Oral BID  . chlorhexidine gluconate (MEDLINE KIT)  15 mL Mouth Rinse BID  . Chlorhexidine Gluconate Cloth  6 each Topical Q0600  . darbepoetin (ARANESP) injection - DIALYSIS  100 mcg Intravenous Q Wed-HD  . docusate  100 mg Per Tube  Daily  . feeding supplement (PRO-STAT SUGAR FREE 64)  30 mL Per Tube TID  . insulin aspart  0-9 Units Subcutaneous Q4H  . mouth rinse  15 mL Mouth Rinse 10 times per day  . midodrine  10 mg Per Tube Q8H  . multivitamin  1 tablet Oral QHS  . pantoprazole sodium  40 mg Per Tube QHS  . sodium chloride flush  10-40  mL Intracatheter Q12H  . sodium chloride flush  10-40 mL Intracatheter Q12H  . white petrolatum   Topical Daily   Continuous Infusions: . sodium chloride Stopped (12/01/19 1424)  . feeding supplement (VITAL 1.5 CAL) 1,000 mL (12/11/19 0555)     LOS: 25 days    Time spent: 35 minutes  Geradine Girt, DO [facility]   If 7PM-7AM, please contact night-coverage [email or web address] Password [email or web address] 12/11/2019, 1:12 PM

## 2019-12-12 ENCOUNTER — Inpatient Hospital Stay (HOSPITAL_COMMUNITY): Payer: Medicare Other

## 2019-12-12 DIAGNOSIS — I4819 Other persistent atrial fibrillation: Secondary | ICD-10-CM

## 2019-12-12 DIAGNOSIS — Z515 Encounter for palliative care: Secondary | ICD-10-CM

## 2019-12-12 LAB — CBC
HCT: 23 % — ABNORMAL LOW (ref 39.0–52.0)
Hemoglobin: 7.3 g/dL — ABNORMAL LOW (ref 13.0–17.0)
MCH: 28.3 pg (ref 26.0–34.0)
MCHC: 31.7 g/dL (ref 30.0–36.0)
MCV: 89.1 fL (ref 80.0–100.0)
Platelets: 364 10*3/uL (ref 150–400)
RBC: 2.58 MIL/uL — ABNORMAL LOW (ref 4.22–5.81)
RDW: 17.3 % — ABNORMAL HIGH (ref 11.5–15.5)
WBC: 13.6 10*3/uL — ABNORMAL HIGH (ref 4.0–10.5)
nRBC: 0.1 % (ref 0.0–0.2)

## 2019-12-12 LAB — RENAL FUNCTION PANEL
Albumin: 2.3 g/dL — ABNORMAL LOW (ref 3.5–5.0)
Anion gap: 15 (ref 5–15)
BUN: 76 mg/dL — ABNORMAL HIGH (ref 6–20)
CO2: 27 mmol/L (ref 22–32)
Calcium: 8.3 mg/dL — ABNORMAL LOW (ref 8.9–10.3)
Chloride: 91 mmol/L — ABNORMAL LOW (ref 98–111)
Creatinine, Ser: 5.66 mg/dL — ABNORMAL HIGH (ref 0.61–1.24)
GFR calc Af Amer: 12 mL/min — ABNORMAL LOW (ref >60)
GFR calc non Af Amer: 11 mL/min — ABNORMAL LOW (ref >60)
Glucose, Bld: 107 mg/dL — ABNORMAL HIGH (ref 70–99)
Phosphorus: 3.2 mg/dL (ref 2.5–4.6)
Potassium: 5.1 mmol/L (ref 3.5–5.1)
Sodium: 133 mmol/L — ABNORMAL LOW (ref 135–145)

## 2019-12-12 LAB — GLUCOSE, CAPILLARY
Glucose-Capillary: 103 mg/dL — ABNORMAL HIGH (ref 70–99)
Glucose-Capillary: 105 mg/dL — ABNORMAL HIGH (ref 70–99)
Glucose-Capillary: 101 mg/dL — ABNORMAL HIGH (ref 70–99)
Glucose-Capillary: 105 mg/dL — ABNORMAL HIGH (ref 70–99)
Glucose-Capillary: 102 mg/dL — ABNORMAL HIGH (ref 70–99)

## 2019-12-12 LAB — MAGNESIUM: Magnesium: 2.1 mg/dL (ref 1.7–2.4)

## 2019-12-12 LAB — PHOSPHORUS: Phosphorus: 3.1 mg/dL (ref 2.5–4.6)

## 2019-12-12 MED ORDER — CHLORHEXIDINE GLUCONATE 0.12 % MT SOLN
OROMUCOSAL | Status: AC
  Filled 2019-12-12: qty 15

## 2019-12-12 MED ORDER — APIXABAN 5 MG PO TABS
5.0000 mg | ORAL_TABLET | Freq: Two times a day (BID) | ORAL | Status: DC
  Administered 2019-12-12 – 2019-12-14 (×4): 5 mg via ORAL
  Filled 2019-12-12 (×3): qty 1

## 2019-12-12 NOTE — Progress Notes (Addendum)
Patient ID: [name] [name], male   DOB: Apr 26, 1966, 53 y.o.   MRN: [id number]  [facility] Progress Note   Assessment/ Plan:   1. Status post PEA and V. tach arrest: Cardiogenic shock resolved, remains on amiodarone and Eliquis.  No recurrence of ventricular tachycardia with last dialysis.  Not candidate for ICD with tracheostomy in place as well as previous concerns of adherence/drug use. 2. ESRD: He is usually on a Monday/Wednesday/Friday schedule and underwent hemodialysis yesterday per holiday schedule-he will get back onto his usual schedule with hemodialysis today.  3. Anemia: Without overt blood loss, low hemoglobin/hematocrit noted with last Aranesp dose on 12/22. 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 5. Nutrition: Ongoing tube feeds at this time, restart renal multivitamin. 6. Bilateral lower extremity ischemia: Currently on anticoagulation with apixaban and ongoing close evaluation and management by vascular surgery with brisk lower extremity Doppler signals noted; allowing demarcation to decide on need for surgical intervention. 7. BP/ volume - wt's up, +edema,  ^ UF on HD, get f/u CXR  Kelly Splinter, MD 12/12/2019, 8:30 AM    Subjective:   No new c/o   Objective:   BP 116/72   Pulse 89   Temp 98.1 F (36.7 C) (Oral)   Resp (!) 22   Ht 5' 10.5" (1.791 m)   Wt 83.4 kg   SpO2 99%   BMI 26.01 kg/m   Physical Exam: Gen: Appears comfortable resting in bed, tracheostomy in place, NGT in situ. CVS: Pulse regular rhythm, normal rate, S1 and S2 normal Resp: Anteriorly clear to auscultation, no rales/rhonchi.  Asymmetric left neck/upper chest swelling that is soft Abd: Soft, mildly distended, bowel sounds normal Ext: 2+ RUE edema, +depenent hip edema,  bilateral leg/ankle wrapped in clean gauze.  Ischemic changes of digits noted.  Left upper arm AV fistula with hyperpulsatility, intact dressings.   HD [facility]: [place]  MWF  4h 55mn  74.5kg  2/2.5 bath  340/A1.5   P4  Hep 5000     C3 0.75 qTx     no ESA/Fe     Hb 12s usually    Medications:    . amiodarone  400 mg Per Tube BID  . apixaban  2.5 mg Oral BID  . chlorhexidine gluconate (MEDLINE KIT)  15 mL Mouth Rinse BID  . Chlorhexidine Gluconate Cloth  6 each Topical Q0600  . darbepoetin (ARANESP) injection - DIALYSIS  100 mcg Intravenous Q Wed-HD  . docusate  100 mg Per Tube Daily  . feeding supplement (PRO-STAT SUGAR FREE 64)  30 mL Per Tube TID  . insulin aspart  0-9 Units Subcutaneous Q4H  . mouth rinse  15 mL Mouth Rinse 10 times per day  . midodrine  10 mg Per Tube Q8H  . multivitamin  1 tablet Oral QHS  . pantoprazole sodium  40 mg Per Tube QHS  . sodium chloride flush  10-40 mL Intracatheter Q12H  . sodium chloride flush  10-40 mL Intracatheter Q12H  . white petrolatum   Topical Daily

## 2019-12-12 NOTE — Progress Notes (Signed)
NAME:  [name] [name], MRN:  [id number], DOB:  1966/09/26, LOS: 24 ADMISSION DATE:  11/16/2019, CONSULTATION DATE:  12/2 REFERRING MD:  Dr. Sedonia Small, CHIEF COMPLAINT:  Post cardiac arrest    Brief History   53yo male presented post PEA arrest which occurred during iHD treatment as outpatient. This was a witnessed arrest with quick bystander CPR. Received 3 rounds of epi, 1g of calcium and 50 mEq of bicarb with ROSC after 15 minutes.   Emergent cricothyrotomy performed by EMS due to failed intubation with [facility] airway.   Past Medical History  NSVT 2017 Nonischemic cardiomyopathy Medical noncompliance Polysubstance abuse Hypertension End-stage renal disease Combined systolic and diastolic congestive heart failure Cocaine abuse Alcohol abuse Prolonged QTC  Significant Hospital Events   12/2 Admitted post cardiac arrest, emergent cric, 33 C TTM 12/8 V. tach arrest 12/10 Cric changed to tracheostomy 12/16 HD, tolerated only 30 minutes of trach collar 12/18 - He is critically ill, remains on vent. Soft blood pressure overnight but off pressors Afebrile. Rx HD 12/19 -  MAP > 65 with slow SBP. Doing PSV via trach. EP wants amio gtt cntinued through 12/21 and then decide on Dc. RN concerned about bilsters on feet and knees. She has called wound care. Not on sedation gtt. Following commands/. No active bleeding. Diarrhea + per RN 12/20: VVS recommends continued monitoring of LE and allowed necrosis to demarcate. Patient reportedly opposed to idea of amputaiton. Failed ATC and back on full vent. Amio gtt cotinues.  Afebrile.  Renam planning repeat HD v CRRT with pressor support 12/10/19 based on BP. Denies complaints other than LE pain. On TF 12/06/2019 complains of any pain with manipulation of lower extremities.  Currently on full mechanical ventilatory support.  No acute distress 12/23 did not tolerate wean well 12/24 Not tolerating wean, no events overnight 12/09/2019 -called by RN for  increased anxiety when attempted to come off vent on ATC. Pt with increased wob on atc, getting dialysis at this time. 12/26 Failed PS 12/27 Failed PS   Consults:  Nephrology Electrophysiology ENT [place] 12/18   Procedures:  12/10-trach 12/4 RT fem HD cath >> out 12/2 RT [place] CVL >>  Significant Diagnostic Tests:  CTA 12/2-segmental filling defects in the right middle lobe and right lower lobe pulmonary arteries, pneumomediastinum, subcu emphysema CT head 12/8-no acute abnormality  Echocardiogram 11/17/19-LVEF less than 20%, normal LV systolic function  Micro Data:  COVID 12/2 >> negative Aspirate 12/04 >> + for Haemophilus influenza  12/10 MRSA Nasal swab >> Positive 12/11 [place] >> negative 12/11 Respiratory CX >> Staph Epidermidis  Antimicrobials:  Unasyn 12/4>>12/7 Ceftriazone 12/7>>12/13  Interim history/subjective:  On mechanical ventilation via trach. Nods head appropriately. Has remained on full vent support due to failing PS yesterday and the day before.  Objective   Blood pressure 116/72, pulse 72, temperature 98.3 F (36.8 C), temperature source Oral, resp. rate 20, height 5' 10.5" (1.791 m), weight 83.4 kg, SpO2 99 %.    Vent Mode: PRVC FiO2 (%):  [40 %] 40 % Set Rate:  [20 bmp] 20 bmp Vt Set:  [590 mL] 590 mL PEEP:  [5 cmH20] 5 cmH20 Pressure Support:  [12 cmH20] 12 cmH20 Plateau Pressure:  [26 cmH20-29 cmH20] 27 cmH20   Intake/Output Summary (Last 24 hours) at 12/12/2019 0738 Last data filed at 12/12/2019 0700 Gross per 24 hour  Intake 1250 ml  Output 100 ml  Net 1150 ml   Filed Weights   12/10/19 1340 12/11/19 0000 12/12/19  2952  Weight: 83 kg 82.2 kg 83.4 kg   Recent Labs    12/10/19 0402 12/12/19 0308  HGB 7.7* 7.3*   Physical Exam: General: Well-appearing, no acute distress HENT: [place], AT, OP clear, MMM Neck: Trach in place, c/d/i Eyes: EOMI, no scleral icterus Respiratory: Coarse anterior breath sounds Cardiovascular: irregularly irregular  rate and rhythm, -M/R/G, no JVD GI: BS+, soft, nontender Extremities: LUE AVF, 1+ pitting edema in lower extremities, hyperpigment demarcation Neuro: AAO x4, CNII-XII grossly intact, moves extremities x 4 Skin: Intact, no rashes or bruising Psych: Normal mood, normal affect GU: Rectal pouch in place  Resolved Hospital Problem list   Ileus Cardiogenic shock   Assessment & Plan:   S/p PEA arrest on 12/2 S/p VT/VF cardiac arrest on 12/8 with ongoing NSVT Doubt PE but contrast timing poor, venous duplex negative, prolonged QT Plan -Continue amiodarone. Taper per EP -Not a candidate for ICD per EP. Discuss candidacy of Life Vest with consult team. -Telemetry  Combined systolic and diastolic CHF EF <84% Chronic atrial fibrillation Plan -Amiodarone  -Telemetry -Continue Eliquis  Acute on chronic respiratory failure s/p tracheostomy Difficult intubation needing cric in field s/p exchange for tracheostomy  Remains difficult to wean off vent due to volume status (+9L CFB) in setting of ESRD and heart failure, deconditioning. Will require facility to continue vent weaning. Plan -Attempt PS today -Full vent support -CXR today -Consult CSW for placement  RML and RLL subsegmental PE on 12/2 -Continue Eliquis   ESRD Plan -Hemodialysis schedule per Nephrology. Plan for today. -Trend BMP -Continue midodrine 10 mg TID   Anxiety Plan -As needed Xanax  Hx polysustance abuse- cocaine, alcohol, tobacco Plan -Will need cessation counseling but doubt will continue to be an issue given current condition  Chronic Anemia, multifactorial -chronic illness, critical illness, blood loss related to frequent lab draws Plan -Trend daily CBC -Transfuse for Hg <7 -Aranesp per Nephrology  Ischemic changes of feet  -due to low flow, obtain vascular input, now off IV heparin. Seen by VVS 12/18 - VVS concerned that patient at risk for amputation of lower extremity and blisters are signaling  ischemia. Recommending supportive care and await demarcation. Patient not interested in amputation Plan  -Wound care involved -Vascular vein surgery is following. Appreciate input. Per note awaiting full demarcation prior to decision on amputation  Difficult IV access -At present there is not indication for CVC. If there is an indication, this may need to be evaluated by IR  Plan Change all meds to PO IV team consult appreciated  Goals of Care -Palliative Care has been very thoughtful with [place] discussions in setting of decompensated and end-stage chronic conditions and acute/recent critical illness. At present, family wishes for full scope of treatment. Appreciate Palliative Care continuing these discussions   Will discuss with Case Management for placement options Dialysis today Attempt PS as tolerated on vent  I updated father, [name], on 12/28 on patient's current clinical condition and need for facility placement which will likely be difficult due to his ESRD and trach status.  LABS   PULMONARY No results for input(s): PHART, PCO2ART, PO2ART, HCO3, TCO2, O2SAT in the last 168 hours.  Invalid input(s): PCO2, PO2  CBC Recent Labs  Lab 12/08/19 0537 12/10/19 0402 12/12/19 0308  HGB 7.1* 7.7* 7.3*  HCT 22.4* 24.9* 23.0*  WBC 10.8* 12.7* 13.6*  PLT 393 393 364    COAGULATION No results for input(s): INR in the last 168 hours.  CARDIAC  No results  for input(s): TROPONINI in the last 168 hours. No results for input(s): PROBNP in the last 168 hours.   CHEMISTRY Recent Labs  Lab 12/06/19 0218 12/07/19 0246 12/08/19 0537 12/09/19 0320 12/10/19 0402 12/10/19 1103 12/11/19 0302 12/12/19 0308  NA 134* 130*  131* 132* 132* 133*  --  134* 133*  K 4.5 5.5*  5.4* 4.6 5.1 5.3*  --  4.5 5.1  CL 93* 90*  90* 91* 91* 91*  --  94* 91*  CO2 25 24  24 25 25 26   --  26 27  GLUCOSE 112* 95  97 108* 99 94  --  107* 107*  BUN 47* 72*  73* 48* 70* 90*  --  56* 76*    CREATININE 4.52* 5.80*  5.85* 4.24* 5.46* 6.56*  --  4.49* 5.66*  CALCIUM 7.8* 7.8*  7.8* 8.0* 8.2* 7.9*  --  8.1* 8.3*  MG 1.9 2.1 2.0  --   --  2.0  --  2.1  PHOS 2.3* 2.4*  2.4* 1.8* 2.1* 3.2  --  2.8 3.1  3.2   Estimated Creatinine Clearance: 16.2 mL/min (A) (by C-G formula based on SCr of 5.66 mg/dL (H)).   LIVER Recent Labs  Lab 12/08/19 0537 12/09/19 0320 12/10/19 0402 12/11/19 0302 12/12/19 0308  AST  --   --  14*  --   --   ALT  --   --  14  --   --   ALKPHOS  --   --  168*  --   --   BILITOT  --   --  0.7  --   --   PROT  --   --  6.7  --   --   ALBUMIN 2.3* 2.3* 2.3* 2.3* 2.3*     INFECTIOUS No results for input(s): LATICACIDVEN, PROCALCITON in the last 168 hours.   ENDOCRINE CBG (last 3)  Recent Labs    12/11/19 1928 12/11/19 2335 12/12/19 0343  GLUCAP 99 106* 103*   The patient is critically ill with multiple organ systems failure and requires high complexity decision making for assessment and support, frequent evaluation and titration of therapies, application of advanced monitoring technologies and extensive interpretation of multiple databases.   Critical Care Time devoted to patient care services described in this note is 35 Minutes.   Rodman Pickle, M.D. [facility] Pulmonary/Critical Care Medicine 12/12/2019 8:19 AM   Please see Amion for pager number to reach on-call Pulmonary and Critical Care Team.

## 2019-12-12 NOTE — Progress Notes (Signed)
PROGRESS NOTE    [name] [name]  WIO:[id number] DOB: 1966/02/03 DOA: 11/16/2019 PCP: Jean Rosenthal, MD   Brief Narrative:  53 year old man with PMH of HTN, CHF, Alcohol use, Substance use, ESRD on HD is admitted with PEA arrest at HD unit. Immediately witnessed and CPR with ROSC at 15 minutes Emergent cricothyrotomy performed by EMS due to failed intubation with [facility] airway. 12/08/19 Palliative care met with family and discussed options with pt. At this time, they are open to all offered and available medical interventions to prolong life. Tx to [facility] on 12/23?   Assessment & Plan:   S/P Cardiac arrest. V fib/arrythmias. In the setting of combined systolic and diastolic CHF, NSVT, prolonged QT -Transitioned to  amiodarone. Not a candidate for internal cardiac defibrillator per EP. Cont monitoring on tele  EF <20%  A fib: chronic  -amiodarone p.o.  -apixaban  Acute respiratory failure with hypoxia, difficult intubation needing cric- s/p Trach .  -remains on vent. Cont per pulmonology: At times becomes anxious and fails to completely wean.  + H. Flu 12/4  Subsegmental PE: -apixaban at 2.5 mg by PCCM on 12/23-- will increased to 29m as recommended by pharmacy-- will monitor for bleeding closely  ESRD- -HD per renal -M/W/F outpatient   Hx polysustance abuse- cocaine, alcohol, tobacco. Monitor no s/s of etoh withdrawals   Ileus -monitor closely for re-occurance   Anemia normocytic-- chronic disease? -trend -transfuse for <7  Ischemic changes of feet  -due to low flow, Seen by VVS 12/18 - VVS consult concerned that patient is at risk for amputation of lower extremity and blisters are signaling ischemia. Recommending supportive care and await demarcation  Superficial vein Thrombus - 12/22 on Eliquis for atrial fibrillation   DVT prophylaxis: eliquis Code Status: Full    Consultants:  Nephrology Cardiology Pulmonary [place] Palliative  care     Subjective: Getting HD  Objective: Vitals:   12/12/19 1315 12/12/19 1330 12/12/19 1400 12/12/19 1430  BP: 112/77 115/76 103/77 106/74  Pulse: 85 90 85 89  Resp: (!) _0 (!) 24  Temp:      TempSrc:      SpO2: 98% 98% 100% 96%  Weight:      Height:        Intake/Output Summary (Last 24 hours) at 12/12/2019 1526 Last data filed at 12/12/2019 1400 Gross per 24 hour  Intake 1200 ml  Output 100 ml  Net 1100 ml   Filed Weights   12/10/19 1340 12/11/19 0000 12/12/19 0412  Weight: 83 kg 82.2 kg 83.4 kg    Examination: In bed, getting HD  Data Reviewed: I have personally reviewed following labs and imaging studies  CBC: Recent Labs  Lab 12/06/19 0218 12/07/19 0246 12/08/19 0537 12/10/19 0402 12/12/19 0308  WBC 10.9* 12.3* 10.8* 12.7* 13.6*  NEUTROABS 8.3* 9.3* 8.3*  --   --   HGB 7.2* 7.8* 7.1* 7.7* 7.3*  HCT 22.8* 25.0* 22.4* 24.9* 23.0*  MCV 91.2 91.9 91.4 89.6 89.1  PLT 417* 415* 393 393 3426  Basic Metabolic Panel: Recent Labs  Lab 12/06/19 0218 12/07/19 0246 12/08/19 0537 12/09/19 0320 12/10/19 0402 12/10/19 1103 12/11/19 0302 12/12/19 0308  NA 134* 130*  131* 132* 132* 133*  --  134* 133*  K 4.5 5.5*  5.4* 4.6 5.1 5.3*  --  4.5 5.1  CL 93* 90*  90* 91* 91* 91*  --  94* 91*  CO2 _1 25  25 26  --  26 27  GLUCOSE 112* 95  97 108* 99 94  --  107* 107*  BUN 47* 72*  73* 48* 70* 90*  --  56* 76*  CREATININE 4.52* 5.80*  5.85* 4.24* 5.46* 6.56*  --  4.49* 5.66*  CALCIUM 7.8* 7.8*  7.8* 8.0* 8.2* 7.9*  --  8.1* 8.3*  MG 1.9 2.1 2.0  --   --  2.0  --  2.1  PHOS 2.3* 2.4*  2.4* 1.8* 2.1* 3.2  --  2.8 3.1  3.2   GFR: Estimated Creatinine Clearance: 16.2 mL/min (A) (by C-G formula based on SCr of 5.66 mg/dL (H)). Liver Function Tests: Recent Labs  Lab 12/08/19 0537 12/09/19 0320 12/10/19 0402 12/11/19 0302 12/12/19 0308  AST  --   --  14*  --   --   ALT  --   --  14  --   --   ALKPHOS  --   --  168*  --   --    BILITOT  --   --  0.7  --   --   PROT  --   --  6.7  --   --   ALBUMIN 2.3* 2.3* 2.3* 2.3* 2.3*   No results for input(s): LIPASE, AMYLASE in the last 168 hours. No results for input(s): AMMONIA in the last 168 hours. Coagulation Profile: No results for input(s): INR, PROTIME in the last 168 hours. Cardiac Enzymes: No results for input(s): CKTOTAL, CKMB, CKMBINDEX, TROPONINI in the last 168 hours. BNP (last 3 results) No results for input(s): PROBNP in the last 8760 hours. HbA1C: No results for input(s): HGBA1C in the last 72 hours. CBG: Recent Labs  Lab 12/11/19 2335 12/12/19 0343 12/12/19 0749 12/12/19 1112 12/12/19 1515  GLUCAP 106* 103* 105* 101* 105*   Lipid Profile: No results for input(s): CHOL, HDL, LDLCALC, TRIG, CHOLHDL, LDLDIRECT in the last 72 hours. Thyroid Function Tests: No results for input(s): TSH, T4TOTAL, FREET4, T3FREE, THYROIDAB in the last 72 hours. Anemia Panel: No results for input(s): VITAMINB12, FOLATE, FERRITIN, TIBC, IRON, RETICCTPCT in the last 72 hours. Urine analysis:    Component Value Date/Time   COLORURINE YELLOW 04/19/2014 0109   APPEARANCEUR CLEAR 04/19/2014 0109   LABSPEC 1.012 04/19/2014 0109   PHURINE 7.0 04/19/2014 0109   GLUCOSEU NEGATIVE 04/19/2014 0109   HGBUR SMALL (A) 04/19/2014 0109   BILIRUBINUR NEGATIVE 04/19/2014 0109   KETONESUR NEGATIVE 04/19/2014 0109   PROTEINUR 100 (A) 04/19/2014 0109   UROBILINOGEN 0.2 04/19/2014 0109   NITRITE NEGATIVE 04/19/2014 0109   LEUKOCYTESUR TRACE (A) 04/19/2014 0109   Scheduled Meds: . amiodarone  400 mg Per Tube BID  . apixaban  2.5 mg Oral BID  . chlorhexidine gluconate (MEDLINE KIT)  15 mL Mouth Rinse BID  . Chlorhexidine Gluconate Cloth  6 each Topical Q0600  . darbepoetin (ARANESP) injection - DIALYSIS  100 mcg Intravenous Q Wed-HD  . docusate  100 mg Per Tube Daily  . feeding supplement (PRO-STAT SUGAR FREE 64)  30 mL Per Tube TID  . insulin aspart  0-9 Units Subcutaneous  Q4H  . mouth rinse  15 mL Mouth Rinse 10 times per day  . midodrine  10 mg Per Tube Q8H  . multivitamin  1 tablet Oral QHS  . pantoprazole sodium  40 mg Per Tube QHS  . sodium chloride flush  10-40 mL Intracatheter Q12H  . sodium chloride flush  10-40 mL Intracatheter Q12H  . white petrolatum  Topical Daily   Continuous Infusions: . sodium chloride Stopped (12/01/19 1424)  . feeding supplement (VITAL 1.5 CAL) 1,000 mL (12/12/19 0520)     LOS: 26 days    Time spent: 15 minutes  Geradine Girt, DO [facility]   If 7PM-7AM, please contact night-coverage [email or web address] Password [facility] 12/12/2019, 3:26 PM

## 2019-12-12 NOTE — Progress Notes (Signed)
Pt awake follows some commands  No pain in feet  Bilateral skin infarction in feet dry gangrenous changes tips of toes right foot and one toe left foot.  Will recheck later this week.  No acute need for intervention  Ruta Hinds, MD [facility] [place] Office: [phone]

## 2019-12-12 NOTE — Progress Notes (Addendum)
Electrophysiology Rounding Note  Patient Name: [name] [name] Date of Encounter: 12/12/2019  Primary Cardiologist: Fransico Him, MD Electrophysiologist: New (Previously consulted Dr. Caryl Comes 02/2016, then lost to follow up)  Subjective   Remains on vent support via trach (revised last week). Has failed wean several times and may need LTAC.   No acute complaints at this time. No further sustained VT.   Palliative following and remains full code.   Inpatient Medications    Scheduled Meds: . amiodarone  400 mg Per Tube BID  . apixaban  2.5 mg Oral BID  . chlorhexidine gluconate (MEDLINE KIT)  15 mL Mouth Rinse BID  . Chlorhexidine Gluconate Cloth  6 each Topical Q0600  . darbepoetin (ARANESP) injection - DIALYSIS  100 mcg Intravenous Q Wed-HD  . docusate  100 mg Per Tube Daily  . feeding supplement (PRO-STAT SUGAR FREE 64)  30 mL Per Tube TID  . insulin aspart  0-9 Units Subcutaneous Q4H  . mouth rinse  15 mL Mouth Rinse 10 times per day  . midodrine  10 mg Per Tube Q8H  . multivitamin  1 tablet Oral QHS  . pantoprazole sodium  40 mg Per Tube QHS  . sodium chloride flush  10-40 mL Intracatheter Q12H  . sodium chloride flush  10-40 mL Intracatheter Q12H  . white petrolatum   Topical Daily   Continuous Infusions: . sodium chloride Stopped (12/01/19 1424)  . feeding supplement (VITAL 1.5 CAL) 1,000 mL (12/12/19 0520)   PRN Meds: sodium chloride, acetaminophen (TYLENOL) oral liquid 160 mg/5 mL, acetaminophen, ALPRAZolam, fentaNYL (SUBLIMAZE) injection, oxyCODONE, prochlorperazine, sodium chloride flush, sodium chloride flush   Vital Signs    Vitals:   12/12/19 1000 12/12/19 1100 12/12/19 1115 12/12/19 1117  BP: 121/72 108/70    Pulse: 85 81 81   Resp: _0 Temp:    98.2 F (36.8 C)  TempSrc:    Oral  SpO2: 97% 98% 98%   Weight:      Height:        Intake/Output Summary (Last 24 hours) at 12/12/2019 1129 Last data filed at 12/12/2019 1000 Gross per 24 hour    Intake 1200 ml  Output 100 ml  Net 1100 ml   Filed Weights   12/10/19 1340 12/11/19 0000 12/12/19 0412  Weight: 83 kg 82.2 kg 83.4 kg    Physical Exam    GEN- The patient is chronically ill appearing, alert and oriented x 3 today.   Head- normocephalic, atraumatic Eyes-  Sclera clear, conjunctiva pink Ears- hearing intact Oropharynx- clear Neck- supple. Vent via trach Lungs- Diminished throughout, normal work of breathing Heart- Irregular rate and rhythm, no murmurs, rubs or gallops GI- soft, NT, ND, + BS Extremities- no clubbing, cyanosis, or edema Skin- no rash or lesion Psych- euthymic mood, full affect Neuro- strength and sensation are intact  Labs    CBC Recent Labs    12/10/19 0402 12/12/19 0308  WBC 12.7* 13.6*  HGB 7.7* 7.3*  HCT 24.9* 23.0*  MCV 89.6 89.1  PLT 393 734   Basic Metabolic Panel Recent Labs    12/10/19 1103 12/11/19 0302 12/12/19 0308  NA  --  134* 133*  K  --  4.5 5.1  CL  --  94* 91*  CO2  --  26 27  GLUCOSE  --  107* 107*  BUN  --  56* 76*  CREATININE  --  4.49* 5.66*  CALCIUM  --  8.1* 8.3*  MG  2.0  --  2.1  PHOS  --  2.8 3.1  3.2   Liver Function Tests Recent Labs    12/10/19 0402 12/11/19 0302 12/12/19 0308  AST 14*  --   --   ALT 14  --   --   ALKPHOS 168*  --   --   BILITOT 0.7  --   --   PROT 6.7  --   --   ALBUMIN 2.3* 2.3* 2.3*   No results for input(s): LIPASE, AMYLASE in the last 72 hours. Cardiac Enzymes No results for input(s): CKTOTAL, CKMB, CKMBINDEX, TROPONINI in the last 72 hours.   Telemetry    Afib 70-90s (personally reviewed)  Radiology    DG CHEST PORT 1 VIEW  Result Date: 12/12/2019 CLINICAL DATA:  Hypoxemia EXAM: PORTABLE CHEST 1 VIEW COMPARISON:  12/07/2019 FINDINGS: Tracheostomy tube remains in place. Enteric tube courses below the diaphragm with distal tip beyond the inferior margin of the film. Stable cardiomegaly. Diffuse bilateral airspace opacities which are similar to slightly  progressed compared to prior study. Small bilateral pleural effusions. No pneumothorax. IMPRESSION: 1. Stable to slight progression of diffuse bilateral airspace opacities. Small bilateral pleural effusions. 2. Stable support apparatus. Electronically Signed   By: Davina Poke D.O.   On: 12/12/2019 08:55    Patient Profile     [name] [name] is a 53 y.o. male 53 y.o. male admitted with a cardiac arrest during ESRD, emergent, now revised tracheostomy, atrial fib.  Assessment & Plan    1. VT/VF Continue to have NSVT, but no prolonged VT/VF Continue amiodarone 400 mg BID for now. Will discuss taper with MD Continue eliquis 2.5 mg BID Not candidate for ICD at this time with new tracheostomy, risk of infection, risk of mortality. Will discuss candidacy for Lifevest with MD.   2. Persistent atrial fibrillation Appears to be new s/p arrest Rates remain controlled on amiodarone Would decrease amiodarone to 400 mg daily tomorrow with no recurrent sustained VT.  He is on Eliquis 2.5 mg BID at this time. At this dose he is NOT fully anticoagulated. Further complicated by anemia.  This patients CHA2DS2-VASc would be at least 4 (HTN, CHF, and + PE on arrival).   3. VDRF Slow weaning on-going. May need LTAC per IM.  Unclear as to whether or not he will tolerate tracheostomy tube removal.   4. Chronic systolic CHF, NICM Echo 78/01/9561 with LVEF <20% Chronically depressed EF, normal cors by cath 2017 (40-45% EF 07/2012 -> and in 25-30% range since 2017) Has been lost to cardiac follow up.  Med adjustment limited by hypotension (currently on midodrine) Long standing. Previous history of PVCs (bigeminy on EKG 10/2018) and severe right axis deviation Equalization of filling pressures on cath 02/2016 in the setting of volume overload but could represent restrictive cardiomyopathy (has not had repeat)  5. H/o substance abuse No tox screen this admission  6. RML and RLL subsegmental PE noted on  admission 12/2 He is on Eliquis 2.5 mg BID since 12/07/2019.  7. Chronic anemia Thought to be multifactorial with chronic illness, HD, and frequent lab draws.  On Aricept per nephrology Plan to transfuse for < 7.0 per notes. Hgb 7.3 this am.   For questions or updates, please contact [place] Please consult [email or web address] for contact info under Cardiology/STEMI.  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  The first is the cause of his cardiomyopathy.  2019 ECG showed frequent PVCs raising this is a possible contributor.  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  His ventricular tachycardia is currently being treated by amiodarone.  Alternative therapy would be implantable defibrillator; however, given that he had recurrent in-hospital ventricular tachycardia he would need adjunctive antiarrhythmic therapy.  Given his multiple other comorbidities we will hold off on ICD implantation presently.  I did review it with him as best as I could, with his trach he is nonverbal.  He nodded his head and understanding.  His atrial fibrillation is new and persistent.  Would recommend full dose anticoagulation and cardioversion--will defer regularly orders to nephrology and/or critical care.  I have led to discuss it-[phone].  His catheterization 2017 was  notable for strikingly high filling pressures.  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  Reviewed the case with Dr. DM.  I do not have a good explanation for why he has it severe right axis deviation  His echo failed to demonstrate right ventricular enlargement or hypertrophy; he does have moderate pulmonary hypertension with 5 Wood units in 2017.  But still.

## 2019-12-13 DIAGNOSIS — E8779 Other fluid overload: Secondary | ICD-10-CM

## 2019-12-13 LAB — POCT I-STAT 7, (LYTES, BLD GAS, ICA,H+H)
Acid-Base Excess: 5 mmol/L — ABNORMAL HIGH (ref 0.0–2.0)
Bicarbonate: 28.6 mmol/L — ABNORMAL HIGH (ref 20.0–28.0)
Calcium, Ion: 1.08 mmol/L — ABNORMAL LOW (ref 1.15–1.40)
HCT: 24 % — ABNORMAL LOW (ref 39.0–52.0)
Hemoglobin: 8.2 g/dL — ABNORMAL LOW (ref 13.0–17.0)
O2 Saturation: 99 %
Patient temperature: 98
Potassium: 5.1 mmol/L (ref 3.5–5.1)
Sodium: 130 mmol/L — ABNORMAL LOW (ref 135–145)
TCO2: 30 mmol/L (ref 22–32)
pCO2 arterial: 35.2 mmHg (ref 32.0–48.0)
pH, Arterial: 7.517 — ABNORMAL HIGH (ref 7.350–7.450)
pO2, Arterial: 103 mmHg (ref 83.0–108.0)

## 2019-12-13 LAB — CBC
HCT: 28.6 % — ABNORMAL LOW (ref 39.0–52.0)
Hemoglobin: 8.7 g/dL — ABNORMAL LOW (ref 13.0–17.0)
MCH: 28.2 pg (ref 26.0–34.0)
MCHC: 30.4 g/dL (ref 30.0–36.0)
MCV: 92.6 fL (ref 80.0–100.0)
Platelets: 363 10*3/uL (ref 150–400)
RBC: 3.09 MIL/uL — ABNORMAL LOW (ref 4.22–5.81)
RDW: 17.7 % — ABNORMAL HIGH (ref 11.5–15.5)
WBC: 13.7 10*3/uL — ABNORMAL HIGH (ref 4.0–10.5)
nRBC: 0.1 % (ref 0.0–0.2)

## 2019-12-13 LAB — GLUCOSE, CAPILLARY
Glucose-Capillary: 100 mg/dL — ABNORMAL HIGH (ref 70–99)
Glucose-Capillary: 103 mg/dL — ABNORMAL HIGH (ref 70–99)
Glucose-Capillary: 120 mg/dL — ABNORMAL HIGH (ref 70–99)
Glucose-Capillary: 91 mg/dL (ref 70–99)
Glucose-Capillary: 94 mg/dL (ref 70–99)
Glucose-Capillary: 96 mg/dL (ref 70–99)
Glucose-Capillary: 98 mg/dL (ref 70–99)

## 2019-12-13 LAB — RENAL FUNCTION PANEL
Albumin: 2.6 g/dL — ABNORMAL LOW (ref 3.5–5.0)
Anion gap: 15 (ref 5–15)
BUN: 74 mg/dL — ABNORMAL HIGH (ref 6–20)
CO2: 23 mmol/L (ref 22–32)
Calcium: 8.6 mg/dL — ABNORMAL LOW (ref 8.9–10.3)
Chloride: 94 mmol/L — ABNORMAL LOW (ref 98–111)
Creatinine, Ser: 5.44 mg/dL — ABNORMAL HIGH (ref 0.61–1.24)
GFR calc Af Amer: 13 mL/min — ABNORMAL LOW (ref >60)
GFR calc non Af Amer: 11 mL/min — ABNORMAL LOW (ref >60)
Glucose, Bld: 94 mg/dL (ref 70–99)
Phosphorus: 3.7 mg/dL (ref 2.5–4.6)
Potassium: 6 mmol/L — ABNORMAL HIGH (ref 3.5–5.1)
Sodium: 132 mmol/L — ABNORMAL LOW (ref 135–145)

## 2019-12-13 MED ORDER — CHLORHEXIDINE GLUCONATE CLOTH 2 % EX PADS
6.0000 | MEDICATED_PAD | Freq: Every day | CUTANEOUS | Status: DC
  Administered 2019-12-14 – 2019-12-16 (×3): 6 via TOPICAL

## 2019-12-13 MED ORDER — CHLORHEXIDINE GLUCONATE CLOTH 2 % EX PADS
6.0000 | MEDICATED_PAD | Freq: Every day | CUTANEOUS | Status: DC
  Administered 2019-12-13 – 2019-12-14 (×2): 6 via TOPICAL

## 2019-12-13 MED ORDER — SODIUM POLYSTYRENE SULFONATE 15 GM/60ML PO SUSP
30.0000 g | Freq: Four times a day (QID) | ORAL | Status: DC
  Administered 2019-12-13: 09:00:00 30 g
  Filled 2019-12-13 (×3): qty 120

## 2019-12-13 MED ORDER — HEPARIN SODIUM (PORCINE) 1000 UNIT/ML IJ SOLN
INTRAMUSCULAR | Status: AC
  Administered 2019-12-13: 15:00:00 1000 [IU]
  Filled 2019-12-13: qty 5

## 2019-12-13 NOTE — Progress Notes (Signed)
OT Cancellation Note  Patient Details Name: Yeudiel Mateo MRN: [id number] DOB: Apr 23, 1966   Cancelled Treatment:    Reason Eval/Treat Not Completed: Fatigue/lethargy limiting ability to participate. Became hypoxic with attempted wean and is fluid overloaded with an extra HD treatment planned, RN reports pt is exhausted. Will follow and see as able.   Jolaine Artist, OT Acute Rehabilitation Services Pager [phone] Office [phone]   Delight Stare 12/13/2019, 10:18 AM

## 2019-12-13 NOTE — Progress Notes (Signed)
RT called for pt SATs reading 88-89% and increased WOB. Upon arrival to room, pt was laid supine in bed while RN was performing foley care. SATS were 87-89%. Pt's RR 32, HR stable. Pt placed back on full support via ventilator. RT will continue to monitor.

## 2019-12-13 NOTE — Progress Notes (Signed)
CRITICAL VALUE ALERT  Critical Value: K 6.0   Date & Time Notied: 12/13/2019 0612  Provider Notified: Warren Lacy   Orders Received/Actions taken: No new orders at this time   Gabriel Rainwater, RN

## 2019-12-13 NOTE — Progress Notes (Signed)
Patient ID: [name] [name], male   DOB: 08-Dec-1966, 53 y.o.   MRN: [id number]  [name] Progress Note   Assessment/ Plan:   1. Status post PEA and V. tach arrest: Cardiogenic shock resolved, remains on amiodarone and Eliquis.  No recurrence of ventricular tachycardia with last dialysis.  Not candidate for ICD with tracheostomy in place as well as previous concerns of adherence/drug use. 2. ESRD - cont HD MWF schedule.  3. Anemia: Without overt blood loss, low hemoglobin/hematocrit noted with last Aranesp dose on 12/22. 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 5. Nutrition: Ongoing tube feeds at this time, restart renal multivitamin. 6. Bilateral lower extremity ischemia: seen by vascular surgery with brisk lower extremity Doppler signals noted; allowing demarcation to decide on need for surgical intervention. 7. BP/ volume - +pulm edema on CXR's, up 8- 10kg from lowest wts here (73-75kg), will plan daily HD until CXR clears up and/or unable to UF  [name] Splinter, MD 12/13/2019, 12:32 PM    Subjective:   No new c/o, sat's dropping. 2.5 L off w/ HD yest   Objective:   BP 98/67   Pulse 92   Temp 97.9 F (36.6 C) (Oral)   Resp (!) 22   Ht 5' 10.5" (1.791 m)   Wt 83.1 kg   SpO2 97%   BMI 25.92 kg/m   Physical Exam: Gen: Appears comfortable resting in bed, tracheostomy in place, NGT in situ. CVS: Pulse regular rhythm, normal rate, S1 and S2 normal Resp: Anteriorly clear to auscultation, no rales/rhonchi.  Asymmetric left neck/upper chest swelling that is soft Abd: Soft, mildly distended, bowel sounds normal Ext: 2+ RUE edema, +depenent hip edema,  bilateral leg/ankle wrapped in clean gauze.  Ischemic changes of digits noted.  Left upper arm AV fistula with hyperpulsatility, intact dressings.   HD OP: [facility] MWF  4h 52mn  74.5kg  2/2.5 bath  340/A1.5   P4  Hep 5000     C3 0.75 qTx     no ESA/Fe     Hb 12s usually     Medications:    . amiodarone  400 mg Per Tube BID  . apixaban  5 mg Oral BID  . chlorhexidine gluconate (MEDLINE KIT)  15 mL Mouth Rinse BID  . Chlorhexidine Gluconate Cloth  6 each Topical Q0600  . Chlorhexidine Gluconate Cloth  6 each Topical Q0600  . darbepoetin (ARANESP) injection - DIALYSIS  100 mcg Intravenous Q Wed-HD  . docusate  100 mg Per Tube Daily  . feeding supplement (PRO-STAT SUGAR FREE 64)  30 mL Per Tube TID  . heparin      . insulin aspart  0-9 Units Subcutaneous Q4H  . mouth rinse  15 mL Mouth Rinse 10 times per day  . midodrine  10 mg Per Tube Q8H  . multivitamin  1 tablet Oral QHS  . pantoprazole sodium  40 mg Per Tube QHS  . sodium chloride flush  10-40 mL Intracatheter Q12H  . sodium chloride flush  10-40 mL Intracatheter Q12H  . sodium polystyrene  30 g Per Tube Q6H  . white petrolatum   Topical Daily

## 2019-12-13 NOTE — Progress Notes (Signed)
  EP continues to follow from a distance.   No further sustained VT. Not candidate for ICD.   If needs LTAC, ideally would get [facility] AFTER, when actually ready for discharge home, but this may be difficult to arrange. Will continue to discuss and monitor for further VT and discharge planning.  Note plans for family palliative care meeting 12/14/2019.   [name] [address]" [place], PA-C  12/13/2019

## 2019-12-13 NOTE — Progress Notes (Signed)
NAME:  Trentin Knappenberger, MRN:  [id number], DOB:  1966-03-29, LOS: 20 ADMISSION DATE:  11/16/2019, CONSULTATION DATE:  12/2 REFERRING MD:  Dr. Sedonia Small, CHIEF COMPLAINT:  Post cardiac arrest    Brief History   53yo male presented post PEA arrest which occurred during iHD treatment as outpatient. This was a witnessed arrest with quick bystander CPR. Received 3 rounds of epi, 1g of calcium and 50 mEq of bicarb with ROSC after 15 minutes.   Emergent cricothyrotomy performed by EMS due to failed intubation with [facility] airway.   Past Medical History  NSVT 2017 Nonischemic cardiomyopathy Medical noncompliance Polysubstance abuse Hypertension End-stage renal disease Combined systolic and diastolic congestive heart failure Cocaine abuse Alcohol abuse Prolonged QTC  Significant Hospital Events   12/2 Admitted post cardiac arrest, emergent cric, 33 C TTM 12/8 V. tach arrest 12/10 Cric changed to tracheostomy 12/16 HD, tolerated only 30 minutes of trach collar 12/18 - He is critically ill, remains on vent. Soft blood pressure overnight but off pressors Afebrile. Rx HD 12/19 -  MAP > 65 with slow SBP. Doing PSV via trach. EP wants amio gtt cntinued through 12/21 and then decide on Dc. RN concerned about bilsters on feet and knees. She has called wound care. Not on sedation gtt. Following commands/. No active bleeding. Diarrhea + per RN 12/20: VVS recommends continued monitoring of LE and allowed necrosis to demarcate. Patient reportedly opposed to idea of amputaiton. Failed ATC and back on full vent. Amio gtt cotinues.  Afebrile.  Renam planning repeat HD v CRRT with pressor support 12/10/19 based on BP. Denies complaints other than LE pain. On TF 12/06/2019 complains of any pain with manipulation of lower extremities.  Currently on full mechanical ventilatory support.  No acute distress 12/23 did not tolerate wean well 12/24 Not tolerating wean, no events overnight 12/09/2019 -called by RN for  increased anxiety when attempted to come off vent on ATC. Pt with increased wob on atc, getting dialysis at this time. 12/27-12/29 Failed PS  Consults:  Nephrology Electrophysiology ENT [place] 12/18   Procedures:  12/10-trach 12/4 RT fem HD cath >> out 12/2 RT [place] CVL >>  Significant Diagnostic Tests:  CTA 12/2-segmental filling defects in the right middle lobe and right lower lobe pulmonary arteries, pneumomediastinum, subcu emphysema CT head 12/8-no acute abnormality  Echocardiogram 11/17/19-LVEF less than 20%, normal LV systolic function  Micro Data:  COVID 12/2 >> negative Aspirate 12/04 >> + for Haemophilus influenza  12/10 MRSA Nasal swab >> Positive 12/11 [place] >> negative 12/11 Respiratory CX >> Staph Epidermidis  Antimicrobials:  Unasyn 12/4>>12/7 Ceftriazone 12/7>>12/13  Interim history/subjective:  Received dialysis yesterday. Reports anxiety. CFB +7.4L  Objective   Blood pressure 137/86, pulse 79, temperature 98.5 F (36.9 C), temperature source Oral, resp. rate 17, height 5' 10.5" (1.791 m), weight 83.1 kg, SpO2 100 %.    Vent Mode: PSV;CPAP FiO2 (%):  [40 %-41 %] 40 % Set Rate:  [20 bmp] 20 bmp Vt Set:  [590 mL] 590 mL PEEP:  [5 cmH20] 5 cmH20 Pressure Support:  [18 cmH20] 18 cmH20 Plateau Pressure:  [12 cmH20-27 cmH20] 26 cmH20   Intake/Output Summary (Last 24 hours) at 12/13/2019 0945 Last data filed at 12/13/2019 0800 Gross per 24 hour  Intake 1150 ml  Output 2575 ml  Net -1425 ml   Filed Weights   12/11/19 0000 12/12/19 0412 12/13/19 0413  Weight: 82.2 kg 83.4 kg 83.1 kg   Recent Labs    12/13/19 0339 12/13/19  0458  HGB 8.7* 8.2*   Physical Exam: General: Well-appearing, no acute distress, on mechanical ventilation HENT: [place], AT, OP clear, MMM Neck: Trach in place, c/d/i Eyes: EOMI, no scleral icterus Respiratory: Diminished breath sounds bilaterally. No crackles, wheezing or rales Cardiovascular: RRR, -M/R/G, no JVD GI: BS+, soft,  nontender Extremities:LUE AVF, non-pitting edema Neuro: Awake, alert, follows commands, moves extremities x 4 Skin: Hyperpigmented demarcation in lower extremities bilaterally GU: Rectal pouch in place  Resolved Hospital Problem list   Ileus Cardiogenic shock   Assessment & Plan:   S/p PEA arrest on 12/2 S/p VT/VF cardiac arrest on 12/8 with ongoing NSVT Doubt PE but contrast timing poor, venous duplex negative, prolonged QT Plan -Continue amiodarone. Taper per EP -Not a candidate for ICD per EP. Discuss candidacy of Life Vest with consult team. -Telemetry  Combined systolic and diastolic CHF EF <46% Chronic atrial fibrillation Plan -Amiodarone  -Telemetry -Continue Eliquis -Dialysis for volume removal  Acute on chronic respiratory failure s/p tracheostomy Difficult intubation needing cric in field s/p exchange for tracheostomy  Remains difficult to wean off vent due to volume status (+7L CFB) in setting of ESRD and heart failure, deconditioning. Will require facility to continue vent weaning. Plan -PS as tolerated -Full vent support -Dialysis for volume removal -CXR tomorrow  RML and RLL subsegmental PE on 12/2 -Continue Eliquis   ESRD Plan -Appreciate Nephrology input. Dialysis today -Trend BMP -Continue midodrine 10 mg TID   Ischemic changes of feet  -due to low flow, obtain vascular input, now off IV heparin. Seen by VVS 12/18 - VVS concerned that patient at risk for amputation of lower extremity and blisters are signaling ischemia. Recommending supportive care and await demarcation. Patient not interested in amputation Plan  -Wound care involved -Vascular vein surgery is following. Appreciate input. Last note 12/28 -Continue anticoagulation  Anxiety Plan -As needed Xanax  Hx polysustance abuse- cocaine, alcohol, tobacco Plan -Will need cessation counseling but doubt will continue to be an issue given current condition  Chronic Anemia,  multifactorial -chronic illness, critical illness, blood loss related to frequent lab draws Plan -Trend daily CBC -Transfuse for Hg <7 -Aranesp per Nephrology  Goals of Care -Palliative Care has been very thoughtful with [place] discussions in setting of decompensated and end-stage chronic conditions and acute/recent critical illness. At present, family wishes for full scope of treatment. Appreciate Palliative Care continuing these discussions   Discussed with Social Work for placement options Dialysis today Attempt PS as tolerated  Patient updated on plan at bedside today. I updated father, [name], on 12/28 on patient's current clinical condition and need for facility placement which will likely be difficult due to his ESRD and trach status.  LABS   PULMONARY Recent Labs  Lab 12/13/19 0458  PHART 7.517*  PCO2ART 35.2  PO2ART 103.0  HCO3 28.6*  TCO2 30  O2SAT 99.0    CBC Recent Labs  Lab 12/10/19 0402 12/12/19 0308 12/13/19 0339 12/13/19 0458  HGB 7.7* 7.3* 8.7* 8.2*  HCT 24.9* 23.0* 28.6* 24.0*  WBC 12.7* 13.6* 13.7*  --   PLT 393 364 363  --     COAGULATION No results for input(s): INR in the last 168 hours.  CARDIAC  No results for input(s): TROPONINI in the last 168 hours. No results for input(s): PROBNP in the last 168 hours.   CHEMISTRY Recent Labs  Lab 12/07/19 0246 12/08/19 2703 12/09/19 0320 12/10/19 0402 12/10/19 1103 12/11/19 0302 12/12/19 0308 12/13/19 0339 12/13/19 0458  NA 130*  131* 132* 132* 133*  --  134* 133* 132* 130*  K 5.5*  5.4* 4.6 5.1 5.3*  --  4.5 5.1 6.0* 5.1  CL 90*  90* 91* 91* 91*  --  94* 91* 94*  --   CO2 24  24 25 25 26   --  26 27 23   --   GLUCOSE 95  97 108* 99 94  --  107* 107* 94  --   BUN 72*  73* 48* 70* 90*  --  56* 76* 74*  --   CREATININE 5.80*  5.85* 4.24* 5.46* 6.56*  --  4.49* 5.66* 5.44*  --   CALCIUM 7.8*  7.8* 8.0* 8.2* 7.9*  --  8.1* 8.3* 8.6*  --   MG 2.1 2.0  --   --  2.0  --  2.1  --   --    PHOS 2.4*  2.4* 1.8* 2.1* 3.2  --  2.8 3.1  3.2 3.7  --    Estimated Creatinine Clearance: 16.9 mL/min (A) (by C-G formula based on SCr of 5.44 mg/dL (H)).   LIVER Recent Labs  Lab 12/09/19 0320 12/10/19 0402 12/11/19 0302 12/12/19 0308 12/13/19 0339  AST  --  14*  --   --   --   ALT  --  14  --   --   --   ALKPHOS  --  168*  --   --   --   BILITOT  --  0.7  --   --   --   PROT  --  6.7  --   --   --   ALBUMIN 2.3* 2.3* 2.3* 2.3* 2.6*     INFECTIOUS No results for input(s): LATICACIDVEN, PROCALCITON in the last 168 hours.   ENDOCRINE CBG (last 3)  Recent Labs    12/12/19 2354 12/13/19 0304 12/13/19 0709  GLUCAP 103* 96 98   The patient is critically ill with multiple organ systems failure and requires high complexity decision making for assessment and support, frequent evaluation and titration of therapies, application of advanced monitoring technologies and extensive interpretation of multiple databases.   Critical Care Time devoted to patient care services described in this note is 34 Minutes.  Rodman Pickle, M.D. [facility] Pulmonary/Critical Care Medicine 12/13/2019 9:46 AM   Please see Amion for pager number to reach on-call Pulmonary and Critical Care Team.

## 2019-12-13 NOTE — Progress Notes (Signed)
PT Cancellation Note  Patient Details Name: [name] [name] MRN: [id number] DOB: 1966-03-09   Cancelled Treatment:    Reason Eval/Treat Not Completed: Other (comment). Nurse reports pt is exhausted. Became hypoxic with attempted wean and is fluid overloaded with an extra HD treatment planned. Will follow up at later date.   Shary Decamp [place]ok 12/13/2019, 10:12 AM [place] Pager [phone] Office [phone]

## 2019-12-13 NOTE — Progress Notes (Signed)
   Patient ID: [name] [name], male   DOB: 06/20/1966, 53 y.o.   MRN: [id number]   This NP visited patient at the bedside as a follow up for palliative medicine needs and emotional support. Patient was resting comfortably in bed.  He is awake and oriented and communicates with head nods, mouthing  Words and they provided the patient with paper and pencil and encouraged him to communicate by writing his thoughts, feelings and questions/concerns  Created space and opportunity for patient to explore his thoughts and feeling regarding his current medical situation.  We discussed the seriousness of his current medcial and real/likely  possilbity that he will not retrun to his baseline.  Discussed that individuals have the right to make decsions for themselves regarding accepting or  discontinuing medical interventions, even those that prolong life.  Understandably this is hard conversation, patient tearful. He writes on clip board "I want to see my grandchildren grow "   Emotional support offered and quiet presence.  Patient notes the words "thank you".  This is a very difficult situation and [name] [name] will need ongoing support as he navigates a difficult life experience.  Again  spoke to the patient's father and plan is for follow-up family meeting for tomorrow Wednesday, December 30th at 4 PM  Patient does not have a documented healthcare power of attorney however patient's father, son and daughter all present today for this meeting will act in unison in the best interest of the patient if at any time he is unable to communicate his wishes himself.  Total time spent on the unit was 35 minutes   Greater than 50% of the time was spent in counseling and coordination of care  Wadie Lessen NP  Palliative Medicine Team Team Phone # [phone] Pager [phone]

## 2019-12-14 ENCOUNTER — Inpatient Hospital Stay (HOSPITAL_COMMUNITY): Payer: Medicare Other

## 2019-12-14 DIAGNOSIS — J9611 Chronic respiratory failure with hypoxia: Secondary | ICD-10-CM

## 2019-12-14 DIAGNOSIS — E8770 Fluid overload, unspecified: Secondary | ICD-10-CM

## 2019-12-14 LAB — POCT ACTIVATED CLOTTING TIME
Activated Clotting Time: 147 seconds
Activated Clotting Time: 147 seconds
Activated Clotting Time: 153 seconds
Activated Clotting Time: 153 seconds
Activated Clotting Time: 147 seconds
Activated Clotting Time: 164 seconds
Activated Clotting Time: 175 seconds

## 2019-12-14 LAB — RENAL FUNCTION PANEL
Albumin: 2.3 g/dL — ABNORMAL LOW (ref 3.5–5.0)
Anion gap: 15 (ref 5–15)
BUN: 57 mg/dL — ABNORMAL HIGH (ref 6–20)
CO2: 26 mmol/L (ref 22–32)
Calcium: 8.4 mg/dL — ABNORMAL LOW (ref 8.9–10.3)
Chloride: 92 mmol/L — ABNORMAL LOW (ref 98–111)
Creatinine, Ser: 4.66 mg/dL — ABNORMAL HIGH (ref 0.61–1.24)
GFR calc Af Amer: 16 mL/min — ABNORMAL LOW (ref >60)
GFR calc non Af Amer: 13 mL/min — ABNORMAL LOW (ref >60)
Glucose, Bld: 96 mg/dL (ref 70–99)
Phosphorus: 3.5 mg/dL (ref 2.5–4.6)
Potassium: 4.7 mmol/L (ref 3.5–5.1)
Sodium: 133 mmol/L — ABNORMAL LOW (ref 135–145)

## 2019-12-14 LAB — GLUCOSE, CAPILLARY
Glucose-Capillary: 86 mg/dL (ref 70–99)
Glucose-Capillary: 96 mg/dL (ref 70–99)
Glucose-Capillary: 92 mg/dL (ref 70–99)
Glucose-Capillary: 79 mg/dL (ref 70–99)
Glucose-Capillary: 92 mg/dL (ref 70–99)
Glucose-Capillary: 94 mg/dL (ref 70–99)

## 2019-12-14 LAB — CBC
HCT: 26 % — ABNORMAL LOW (ref 39.0–52.0)
Hemoglobin: 8 g/dL — ABNORMAL LOW (ref 13.0–17.0)
MCH: 27.9 pg (ref 26.0–34.0)
MCHC: 30.8 g/dL (ref 30.0–36.0)
MCV: 90.6 fL (ref 80.0–100.0)
Platelets: 287 10*3/uL (ref 150–400)
RBC: 2.87 MIL/uL — ABNORMAL LOW (ref 4.22–5.81)
RDW: 17.4 % — ABNORMAL HIGH (ref 11.5–15.5)
WBC: 11.1 10*3/uL — ABNORMAL HIGH (ref 4.0–10.5)
nRBC: 0 % (ref 0.0–0.2)

## 2019-12-14 MED ORDER — VITAL 1.5 CAL PO LIQD
1000.0000 mL | ORAL | Status: DC
  Administered 2019-12-15: 08:00:00 1000 mL
  Filled 2019-12-14 (×3): qty 1000

## 2019-12-14 MED ORDER — PRISMASOL BGK 4/2.5 32-4-2.5 MEQ/L REPLACEMENT SOLN
Status: DC
  Filled 2019-12-14 (×5): qty 5000

## 2019-12-14 MED ORDER — ALTEPLASE 2 MG IJ SOLR
2.0000 mg | Freq: Once | INTRAMUSCULAR | Status: DC | PRN
  Filled 2019-12-14: qty 2

## 2019-12-14 MED ORDER — SODIUM CHLORIDE 0.9 % FOR CRRT
INTRAVENOUS_CENTRAL | Status: DC | PRN
  Filled 2019-12-14: qty 1000

## 2019-12-14 MED ORDER — PRO-STAT SUGAR FREE PO LIQD
30.0000 mL | Freq: Every day | ORAL | Status: DC
  Administered 2019-12-14 – 2019-12-23 (×47): 30 mL
  Filled 2019-12-14 (×46): qty 30

## 2019-12-14 MED ORDER — HEPARIN (PORCINE) 25000 UT/250ML-% IV SOLN
1300.0000 [IU]/h | INTRAVENOUS | Status: AC
  Administered 2019-12-14: 22:00:00 1100 [IU]/h via INTRAVENOUS
  Administered 2019-12-16: 700 [IU]/h via INTRAVENOUS
  Administered 2019-12-17: 15:00:00 850 [IU]/h via INTRAVENOUS
  Administered 2019-12-18 – 2019-12-19 (×2): 1100 [IU]/h via INTRAVENOUS
  Filled 2019-12-14 (×5): qty 250

## 2019-12-14 MED ORDER — SODIUM CHLORIDE 0.9 % IV SOLN
250.0000 [IU]/h | INTRAVENOUS | Status: DC
  Filled 2019-12-14: qty 2

## 2019-12-14 MED ORDER — HEPARIN BOLUS VIA INFUSION (CRRT)
1000.0000 [IU] | INTRAVENOUS | Status: DC | PRN
  Administered 2019-12-14 (×3): 1000 [IU] via INTRAVENOUS_CENTRAL
  Filled 2019-12-14: qty 1000

## 2019-12-14 MED ORDER — HEPARIN BOLUS VIA INFUSION (CRRT)
1000.0000 [IU] | INTRAVENOUS | Status: DC | PRN
  Filled 2019-12-14: qty 1000

## 2019-12-14 MED ORDER — PRISMASOL BGK 4/2.5 32-4-2.5 MEQ/L IV SOLN
INTRAVENOUS | Status: DC
  Filled 2019-12-14 (×37): qty 5000

## 2019-12-14 MED ORDER — LORAZEPAM 2 MG/ML IJ SOLN
1.0000 mg | Freq: Once | INTRAMUSCULAR | Status: AC
  Administered 2019-12-14: 1 mg via INTRAVENOUS
  Filled 2019-12-14: qty 1

## 2019-12-14 MED ORDER — SODIUM CHLORIDE 0.9 % IV SOLN
250.0000 [IU]/h | INTRAVENOUS | Status: DC
  Administered 2019-12-14: 23:00:00 1250 [IU]/h via INTRAVENOUS_CENTRAL
  Administered 2019-12-14: 16:00:00 250 [IU]/h via INTRAVENOUS_CENTRAL
  Administered 2019-12-15: 07:00:00 1450 [IU]/h via INTRAVENOUS_CENTRAL
  Filled 2019-12-14 (×2): qty 2

## 2019-12-14 MED ORDER — AMIODARONE HCL 200 MG PO TABS
400.0000 mg | ORAL_TABLET | Freq: Every day | ORAL | Status: DC
  Administered 2019-12-15 – 2019-12-23 (×9): 400 mg
  Filled 2019-12-14 (×9): qty 2

## 2019-12-14 MED ORDER — HEPARIN SODIUM (PORCINE) 1000 UNIT/ML DIALYSIS
1000.0000 [IU] | INTRAMUSCULAR | Status: DC | PRN
  Administered 2019-12-17: 2400 [IU] via INTRAVENOUS_CENTRAL
  Filled 2019-12-14: qty 6
  Filled 2019-12-14: qty 3
  Filled 2019-12-14 (×2): qty 6

## 2019-12-14 MED ORDER — PRISMASOL BGK 4/2.5 32-4-2.5 MEQ/L REPLACEMENT SOLN
Status: DC
  Filled 2019-12-14 (×9): qty 5000

## 2019-12-14 NOTE — Progress Notes (Signed)
ANTICOAGULATION CONSULT NOTE - Initial Consult  Pharmacy Consult for heparin (Holding apixaban) Indication: atrial fibrillation  Allergies  Allergen Reactions  . Losartan Cough    Patient Measurements: Height: 5' 10.5" (179.1 cm) Weight: 177 lb 4 oz (80.4 kg) IBW/kg (Calculated) : 74.15  Vital Signs: Temp: 98.5 F (36.9 C) (12/30 1100) Temp Source: Oral (12/30 1100) BP: 123/46 (12/30 1400) Pulse Rate: 86 (12/30 1200)  Labs: Recent Labs    12/12/19 0308 12/13/19 0339 12/13/19 0458 12/14/19 0440  HGB 7.3* 8.7* 8.2* 8.0*  HCT 23.0* 28.6* 24.0* 26.0*  PLT 364 363  --  287  CREATININE 5.66* 5.44*  --  4.66*    Estimated Creatinine Clearance: 19.7 mL/min (A) (by C-G formula based on SCr of 4.66 mg/dL (H)).  Assessment: 53 yo m with afib on apixaban. Needed to restart CRRT this am, so plan was to leave on apixaban.  However, filter clotted twice when starting, so decision made to hold apixaban and convert to IV heparin  Last apixaban dose 1000 this am; will initiate CRRT heparin for now  CBC stable  Goal of Therapy:  Heparin level 0.3-0.7 units/ml Monitor platelets by anticoagulation protocol: Yes   Plan:  Dc apixaban  Start hep gtt 1100 units/hr @ 2200 Daily HL CBC F/u ability to stop crrt heparin once IV therapeutic   Barth Kirks, PharmD, BCPS, BCCCP Clinical Pharmacist [phone]  Please check AMION for all [place] numbers  12/14/2019 2:22 PM

## 2019-12-14 NOTE — Progress Notes (Signed)
Assisted tele visit to patient with girlfriend, Malachy Mood.  Maryelizabeth Rowan, RN

## 2019-12-14 NOTE — Progress Notes (Signed)
   [facility]  Subjective  - not sure how much he understands   Objective 132/80 74 98.8 F (37.1 C) (Oral) 14 95%  Intake/Output Summary (Last 24 hours) at 12/14/2019 0839 Last data filed at 12/14/2019 0700 Gross per 24 hour  Intake 2750 ml  Output 3016 ml  Net -266 ml   No real change in feet over the last 2 days.  Pt currently not ambulatory for the last week. Some areas of skin infarction and dry gangrene of toes  Assessment/Planning: Will have Dr Trula Slade recheck next week if remains inpt  Otherwise continue to protect feet hopefully some healing with time  Ruta Hinds, MD [facility]: [phone]   Laboratory Lab Results: Recent Labs    12/13/19 0339 12/13/19 0458 12/14/19 0440  WBC 13.7*  --  11.1*  HGB 8.7* 8.2* 8.0*  HCT 28.6* 24.0* 26.0*  PLT 363  --  287   BMET Recent Labs    12/13/19 0339 12/13/19 0458 12/14/19 0440  NA 132* 130* 133*  K 6.0* 5.1 4.7  CL 94*  --  92*  CO2 23  --  26  GLUCOSE 94  --  96  BUN 74*  --  57*  CREATININE 5.44*  --  4.66*  CALCIUM 8.6*  --  8.4*    COAG Lab Results  Component Value Date   INR 1.6 (H) 11/16/2019   INR 1.5 (H) 11/16/2019   INR 1.36 10/27/2018   No results found for: PTT

## 2019-12-14 NOTE — Progress Notes (Signed)
Physical Therapy Treatment Patient Details Name: [name] [name] MRN: [id number] DOB: [birth year] Today's Date: 12/14/2019    History of Present Illness 53yo male presented post PEA arrest which occurred during iHD treatment as outpatient. This was a witnessed arrest with quick bystander CPR. Emergent cricothyrotomy performed by EMS due to failed intubation with [facility] airway. 11/18/19 CRRT initiated; 12/8 v tach arrest; 12/10 crich changed to tracheostomy; 12/16 HD;  lower extremity vascular ischemia secondary to cardiac arrest and need for pressors. Vascular consulted and awaiting demarcation with likely need for amputations. PMH significant for polysubstance abuse, HTN, ESRD, CHF, nonischemic cardiomyopathy. Trach placed on 12/10.    PT Comments    Pt weaning off vent today. Pt tolerated sitting EOB x 10 min today and participated in both UE and LE exercises. Pt mod/maxAX2 for transfer, deferred OOB as pt is weaning and getting prepped for HD line in neck. Pt remains anxious however responded well to PT/OT cues for slowing breathing and literal hand holding with patient. Patient participated in therapy well today and maintained SpO2 >88% on vent, mostly low 90s. PT tolerated well as pt hasn't been up since 12/24. Acute PT to cont to follow and progress mobility.   Follow Up Recommendations  CIR;Supervision/Assistance - 24 hour     Equipment Recommendations       Recommendations for Other Services       Precautions / Restrictions Precautions Precautions: Fall Precaution Comments: ventilated, rectal tube, feeding tube, pt weaning off vent Restrictions Weight Bearing Restrictions: No    Mobility  Bed Mobility Overal bed mobility: Needs Assistance Bed Mobility: Rolling;Sidelying to Sit Rolling: Mod assist Sidelying to sit: +2 for safety/equipment;HOB elevated;Min assist Supine to sit: Max assist;HOB elevated     General bed mobility comments: max directional verbal cues, pt initiated  LE movement and reaching with R UE, modA to achieve sidelying, maxA for trunk elevation modA for managment of LEs, SPO2 >88%, HR 105bpm  Transfers                 General transfer comment: didn't attempt standing due to weaning off vent and getting prepped for central HD line  Ambulation/Gait                 Stairs             Wheelchair Mobility    Modified Rankin (Stroke Patients Only)       Balance Overall balance assessment: Needs assistance Sitting-balance support: No upper extremity supported;Feet supported Sitting balance-Leahy Scale: Fair Sitting balance - Comments: pt able to tolerate sitting x10 min and completed both UE and LE ther ex x 10 reps without LOB       Standing balance comment: uanble to achieve standing today                            Cognition Arousal/Alertness: Awake/alert Behavior During Therapy: Anxious(requires literal hand holding but responsive) Overall Cognitive Status: Within Functional Limits for tasks assessed                                 General Comments: pt able to follow commands 100% of time, pt demonstrating understanding of upcoming care in conversation with NP      Exercises General Exercises - Lower Extremity Ankle Circles/Pumps: AROM;Both;10 reps;Supine Quad Sets: AROM;Both;5 reps;Supine Long Arc Quad: AROM;Both;5 reps;Seated Hip Flexion/Marching: AROM;Both;5 reps;Seated  General Comments General comments (skin integrity, edema, etc.): pt with bilat lower leg dressings due to blisters, pt with bilat feet discoloration, RN suctioned pt while sitting EOB due to excesive coughing      Pertinent Vitals/Pain Pain Assessment: Faces Faces Pain Scale: Hurts little more Pain Location: grimacing with movement and touching of bilat LEs Pain Descriptors / Indicators: Discomfort;Grimacing Pain Intervention(s): Monitored during session    Home Living                       Prior Function            PT Goals (current goals can now be found in the care plan section) Acute Rehab PT Goals PT Goal Formulation: With patient Time For Goal Achievement: 12/28/19 Potential to Achieve Goals: Fair Progress towards PT goals: Progressing toward goals    Frequency    Min 3X/week      PT Plan Current plan remains appropriate    Co-evaluation PT/OT/SLP Co-Evaluation/Treatment: Yes Reason for Co-Treatment: Complexity of the patient's impairments (multi-system involvement) PT goals addressed during session: Mobility/safety with mobility        AM-PAC PT "6 Clicks" Mobility   Outcome Measure  Help needed turning from your back to your side while in a flat bed without using bedrails?: A Lot Help needed moving from lying on your back to sitting on the side of a flat bed without using bedrails?: A Lot Help needed moving to and from a bed to a chair (including a wheelchair)?: A Lot Help needed standing up from a chair using your arms (e.g., wheelchair or bedside chair)?: A Lot Help needed to walk in hospital room?: A Lot Help needed climbing 3-5 steps with a railing? : Total 6 Click Score: 11    End of Session Equipment Utilized During Treatment: Oxygen(vent) Activity Tolerance: Patient tolerated treatment well Patient left: in bed;with call bell/phone within reach Nurse Communication: Mobility status;Other (comment) PT Visit Diagnosis: Unsteadiness on feet (R26.81);Muscle weakness (generalized) (M62.81)     Time: 5697-9480 PT Time Calculation (min) (ACUTE ONLY): 26 min  Charges:  $Therapeutic Activity: 8-22 mins                     Kittie Plater, PT, DPT Acute Rehabilitation Services Pager #: [phone] Office #: [phone]    [place] Lopes 12/14/2019, 9:32 AM

## 2019-12-14 NOTE — Progress Notes (Signed)
Patient ID: [name] [name], male   DOB: [birth year], 53 y.o.   MRN: [id number]  [facility] Progress Note   Assessment/ Plan:   1. Status post PEA and V. tach arrest: Cardiogenic shock resolved, remains on amiodarone and Eliquis.  No recurrence of ventricular tachycardia with last dialysis.  Not candidate for ICD with tracheostomy in place as well as previous concerns of adherence/drug use. 2. Pulm edema/ vol Marland Kitchen - +pulm edema on CXR, worse today, unable to wean vent.  Serial HD not helping now, will d/w CCM about possible CRRT.   3. ESRD - usual HD MWF.  4. Anemia: Without overt blood loss, low hemoglobin/hematocrit noted with last Aranesp dose on 12/22. 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 6. Nutrition: Ongoing tube feeds at this time, restart renal multivitamin. 7. Bilateral lower extremity ischemia: seen by vascular surgery with brisk lower extremity Doppler signals noted; allowing demarcation to decide on need for surgical intervention.   Kelly Splinter, MD 12/14/2019, 10:13 AM    Subjective:   2.3 L off yest however CXR worse today, unable to wean from vent.   Objective:   BP (!) 141/65   Pulse 97   Temp 98.8 F (37.1 C) (Oral)   Resp (!) 29   Ht 5' 10.5" (1.791 m)   Wt 80.4 kg   SpO2 95%   BMI 25.07 kg/m   Physical Exam: Gen: tracheostomy in place, NGT in situ. CVS: Pulse regular rhythm, normal rate, S1 and S2 normal Resp: Anteriorly clear to auscultation, no rales/rhonchi.  Asymmetric left neck/upper chest swelling that is soft Abd: Soft, mildly distended, bowel sounds normal Ext: 2+ RUE edema, +depenent hip edema,  bilateral leg/ankle wrapped in clean gauze.  Ischemic changes of digits noted.  Left upper arm AV fistula with hyperpulsatility, intact dressings.   HD OP: [facility] MWF  4h 50mn  74.5kg  2/2.5 bath  340/A1.5   P4  Hep 5000     C3 0.75 qTx     no ESA/Fe     Hb 12s usually    Medications:     . amiodarone  400 mg Per Tube BID  . apixaban  5 mg Oral BID  . chlorhexidine gluconate (MEDLINE KIT)  15 mL Mouth Rinse BID  . Chlorhexidine Gluconate Cloth  6 each Topical Q0600  . darbepoetin (ARANESP) injection - DIALYSIS  100 mcg Intravenous Q Wed-HD  . docusate  100 mg Per Tube Daily  . feeding supplement (PRO-STAT SUGAR FREE 64)  30 mL Per Tube TID  . insulin aspart  0-9 Units Subcutaneous Q4H  . mouth rinse  15 mL Mouth Rinse 10 times per day  . midodrine  10 mg Per Tube Q8H  . multivitamin  1 tablet Oral QHS  . pantoprazole sodium  40 mg Per Tube QHS  . sodium chloride flush  10-40 mL Intracatheter Q12H  . sodium chloride flush  10-40 mL Intracatheter Q12H  . white petrolatum   Topical Daily

## 2019-12-14 NOTE — Progress Notes (Signed)
Nutrition Follow-up  DOCUMENTATION CODES:   Not applicable  INTERVENTION:    Change Vital 1.5 goal rate to 45 ml/hr   Increase Pro-Stat to 30 mL 5 times per day   Provides 2120 kcals, 148 g of protein and 825 mL of free water   Continue Rena-Vit   NUTRITION DIAGNOSIS:   Inadequate oral intake related to acute illness as evidenced by NPO status.  Being addressed via TF   GOAL:   Patient will meet greater than or equal to 90% of their needs  Met  MONITOR:   Vent status, Labs, Weight trends, TF tolerance  ASSESSMENT:   53 yo male admitted post cardiac arrest at [facility], required emergent cricothyrotomy in the field. PMH includes ESRD on HD, HTN, CHF, cocaine and EtOH abuse, medical nonadherence  12/02 Admit post Cardiac Arrest, TTM 33 degrees 12/04 Gastric Cortrak placed, CRRT initiated 12/08 Arrested, CRRT to resume 12/10 Cricthothyrotomy changed to [place] 12/11 KUB with ileus pattern, CRRT d/c 12/13 Abd xray with improved ileus 12/15 Cortrak advanced to post-pyloric position  Patient with worsening pulmonary edema and volume overload.  CRRT being resumed today.  Vascular Surgery following for ischemia to BLE due to vasopressors at time of PEA arrest. Patient has areas of skin infarction and dry gangrene of toes.   Patient remains on ventilator support via trach MV: 8.6 L/min Temp (24hrs), Avg:98.6 F (37 C), Min:98.2 F (36.8 C), Max:98.8 F (37.1 C)  Propofol: none  Tolerating Vital 1.5 TF via Cortrak (post pyloric) at 50 ml/h with Pro-stat 30 ml TID to provide 2100 kcal, 126 gm protein, 912 ml free water daily.  Labs reviewed. Sodium 133 (L), BUN 57 (H), creatinine 4.66 (H), potassium and phosphorus WNL CBG's: 96-92-79 Medications reviewed and include colace, rena-vit.  I/O +7.8 L since admission No weight available today.  80.4 kg 12/29  Diet Order:   Diet Order    None      EDUCATION NEEDS:   Not appropriate for education at this  time  Skin:  Skin Assessment: Skin Integrity Issues: Skin Integrity Issues:: Stage II Stage II: sacrum  Last BM:  12/30 type 7  Height:   Ht Readings from Last 1 Encounters:  11/16/19 5' 10.5" (1.791 m)    Weight:   Wt Readings from Last 1 Encounters:  12/13/19 80.4 kg  12/01/19 74.3 kg  BMI:  Body mass index is 25.07 kg/m.  Estimated Nutritional Needs:   Kcal:  2118 kcals  Protein:  120-160 g  Fluid:  1000 mL plus UOP   Molli Barrows, RD, LDN, [place] Pager [phone] After Hours Pager [phone]

## 2019-12-14 NOTE — Progress Notes (Signed)
   Patient ID: [name] [name], male   DOB: [birth year], 53 y.o.   MRN: [id number]   This NP visited patient at the bedside as a follow up for palliative medicine needs and emotional support.  Patient was resting comfortably in bed.  He is awake and oriented and communicates with head nods and holding words. Encouraging him to utilize writing board  He is emotional and tearful with anticipation of meeting with is family  I then met as scheduled with the patient's father, son and daughter, and ex-wife [name] [name] for conversation regarding current medical situation; diagnosis, prognosis, goals of care, treatment option decisions, advanced directive decisions and anticipatory care needs.  The family is considerate and thoughtful, and very much love and appreciate this man, [name] [name] [name].  We discussed his acute on chronic respiratory failure/patient is currently vent dependent, his end-stage renal disease/ dialysis dependent for 5 years, his cardiac arrest and ischemic changes of his feet ( which seems to have stabilized).  They understand that he will likely not return to his previous level of wellness.    Most importantly we explored [name]. [name] [name] values and goals of care important to him as a person. They verbalize that the patient would not want to live totally dependant on anything or anyone.  Gathered at the bedside I spoke to [facility] regarding his medical situation.  I detailed the difference between an aggressive medical intervention path and a palliative comfort path for this patient at this time in this situation.  I stressed to [name] [name] that he is the decision maker and both healthcare providers and his family will support him through this difficult time.    He understands and is overwhelmed.    He clearly expresses his desire to live and his openness to all offered and available medical interventions to prolong life at this time.  Ultimately the hope is to liberate him from the  vent, otherwise his ongoing transitions of care and hopeful improvement will be limited.  We all known vent/dilysis beds are limited.   Discussed with patient and family.  Patient is very much engaged with his family.  He writes to them "I love you very much".  Discussed with patient/family  the importance of continued conversation with each other  and the  medical providers regarding overall plan of care and treatment options,  ensuring decisions are within the context of the patients values and GOCs.       Discussed with bedside RN   Patient does not have a documented healthcare power of attorney however patient's father, son and daughter all present today for this meeting will act in unison in the best interest of the patient if at any time he is unable to communicate his wishes himself.  Total time spent on the unit was 60 minutes   Greater than 50% of the time was spent in counseling and coordination of care  [name] Lessen NP  Palliative Medicine Team Team Phone # [phone] Pager [phone]  This nurse practitioner informed  the patient/family and the attending that I will be out of the hospital until Monday morning.  I will follow-up at that time.

## 2019-12-14 NOTE — Progress Notes (Signed)
Occupational Therapy Treatment Patient Details Name: [name] [name] MRN: [id number] DOB: March 17, 1966 Today's Date: 12/14/2019    History of present illness 53yo male presented post PEA arrest which occurred during iHD treatment as outpatient. This was a witnessed arrest with quick bystander CPR. Emergent cricothyrotomy performed by EMS due to failed intubation with [facility] airway. 11/18/19 CRRT initiated; 12/8 v tach arrest; 12/10 crich changed to tracheostomy; 12/16 HD;  lower extremity vascular ischemia secondary to cardiac arrest and need for pressors. Vascular consulted and awaiting demarcation with likely need for amputations. PMH significant for polysubstance abuse, HTN, ESRD, CHF, nonischemic cardiomyopathy. Trach placed on 12/10.   OT comments  Patient seen for OT/PT today while weaning off vent.  Remains anxious with mobility, requires mod-max assist +2 for bed mobility but able to sit EOB with min guard to supervision for 10 minutes.  Engaged in Horace exercises EOB, increased rest breaks and cueing for slowed breathing.  SpO2 maintained >88% throughout session, HR in low 90s throughout session.  Updated dc plan, as patient may need longer term rehab and may need to look at [facility]; will assess next session.  Will follow acutely.     Follow Up Recommendations  Supervision/Assistance - 24 hour(post acute rehab (SNF vs CIR))    Equipment Recommendations  3 in 1 bedside commode    Recommendations for Other Services      Precautions / Restrictions Precautions Precautions: Fall Precaution Comments: ventilated, rectal tube, feeding tube, pt weaning off vent Restrictions Weight Bearing Restrictions: No       Mobility Bed Mobility Overal bed mobility: Needs Assistance Bed Mobility: Rolling;Sidelying to Sit Rolling: Mod assist Sidelying to sit: +2 for safety/equipment;HOB elevated;Min assist Supine to sit: Max assist;HOB elevated     General bed mobility comments: max directional  verbal cues, pt initiated LE movement and reaching with R UE, modA to achieve sidelying, maxA for trunk elevation modA for managment of LEs; returned to supine with max assist as fatigued  Transfers                 General transfer comment: deferred, anxious and weaning off vent    Balance Overall balance assessment: Needs assistance Sitting-balance support: No upper extremity supported;Feet supported Sitting balance-Leahy Scale: Fair Sitting balance - Comments: pt able to tolerate sitting x10 min and completed both UE and LE ther ex x 10 reps without LOB       Standing balance comment: uanble to achieve standing today                           ADL either performed or assessed with clinical judgement   ADL Overall ADL's : Needs assistance/impaired                     Lower Body Dressing: Total assistance;Bed level Lower Body Dressing Details (indicate cue type and reason): to don socks    Toilet Transfer Details (indicate cue type and reason): deferred due to anxiety and weaning          Functional mobility during ADLs: Moderate assistance;Maximal assistance;Cueing for sequencing General ADL Comments: limited ADL engagement due to anxiety, focused on activity tolerance     Vision       Perception     Praxis      Cognition Arousal/Alertness: Awake/alert Behavior During Therapy: Anxious(responsive to deep breaths, encouargement and hand holding) Overall Cognitive Status: Within Functional Limits for tasks assessed  General Comments: pt able to follow commands 100% of time, pt demonstrating understanding of upcoming care in conversation with NP        Exercises Exercises: General Upper Extremity General Exercises - Upper Extremity Shoulder Flexion: AROM;Both;5 reps;Seated Shoulder Extension: AROM;Both;5 reps;Seated Elbow Flexion: AROM;Both;5 reps;Seated Elbow Extension: AROM;Both;5  reps;Seated Digit Composite Flexion: AROM;Both;5 reps;Seated Composite Extension: AROM;Both;5 reps;Seated General Exercises - Lower Extremity Ankle Circles/Pumps: AROM;Both;10 reps;Supine Quad Sets: AROM;Both;5 reps;Supine Long Arc Quad: AROM;Both;5 reps;Seated Hip Flexion/Marching: AROM;Both;5 reps;Seated   Shoulder Instructions       General Comments RN suctioned pt while sitting EOB due to excessive coughting     Pertinent Vitals/ Pain       Pain Assessment: Faces Faces Pain Scale: Hurts little more Pain Location: grimacing with movement and touching of bilat LEs Pain Descriptors / Indicators: Discomfort;Grimacing Pain Intervention(s): Monitored during session;Repositioned  Home Living                                          Prior Functioning/Environment              Frequency  Min 2X/week        Progress Toward Goals  OT Goals(current goals can now be found in the care plan section)  Progress towards OT goals: Not progressing toward goals - comment(medical complications, difficulty weaning )  Acute Rehab OT Goals Patient Stated Goal: to get stronger OT Goal Formulation: With patient Time For Goal Achievement: 12/28/19 Potential to Achieve Goals: Good  Plan Frequency remains appropriate;Discharge plan needs to be updated    Co-evaluation    PT/OT/SLP Co-Evaluation/Treatment: Yes Reason for Co-Treatment: Complexity of the patient's impairments (multi-system involvement) PT goals addressed during session: Mobility/safety with mobility OT goals addressed during session: ADL's and self-care      AM-PAC OT "6 Clicks" Daily Activity     Outcome Measure   Help from another person eating meals?: Total Help from another person taking care of personal grooming?: A Little Help from another person toileting, which includes using toliet, bedpan, or urinal?: A Lot Help from another person bathing (including washing, rinsing, drying)?: A  Lot Help from another person to put on and taking off regular upper body clothing?: A Lot Help from another person to put on and taking off regular lower body clothing?: Total 6 Click Score: 11    End of Session Equipment Utilized During Treatment: Other (comment)(vent)  OT Visit Diagnosis: Other abnormalities of gait and mobility (R26.89);Muscle weakness (generalized) (M62.81);Pain Pain - part of body: Leg;Ankle and joints of foot(B LEs)   Activity Tolerance Patient limited by fatigue   Patient Left in bed;with call bell/phone within reach;with bed alarm set;with nursing/sitter in room   Nurse Communication Mobility status        Time: 0300-9233 OT Time Calculation (min): 27 min  Charges: OT General Charges $OT Visit: 1 Visit OT Treatments $Therapeutic Activity: 8-22 mins  Jolaine Artist, [place] Pager [phone] Office [phone]    Delight Stare 12/14/2019, 12:56 PM

## 2019-12-14 NOTE — Procedures (Signed)
Hemodialysis Catheter Insertion Procedure Note [name] [name] [id number] [birth year]  Procedure: Insertion of Hemodialysis Catheter Indications: Dialysis Access   Procedure Details Consent: Risks of procedure as well as the alternatives and risks of each were explained to the (patient/caregiver).  Consent for procedure obtained. Time Out: Verified patient identification, verified procedure, site/side was marked, verified correct patient position, special equipment/implants available, medications/allergies/relevent history reviewed, required imaging and test results available.  Performed  Maximum sterile technique was used including antiseptics, cap, gloves, gown, hand hygiene, mask and sheet. Skin prep: Chlorhexidine; local anesthetic administered Triple lumen hemodialysis catheter was inserted into right internal jugular vein using the Seldinger technique.  Evaluation Blood flow good Complications: No apparent complications Patient did tolerate procedure well. Chest X-ray ordered to verify placement.  CXR: pending.   Minda Ditto 12/14/2019

## 2019-12-14 NOTE — Progress Notes (Signed)
NAME:  [name] [name], MRN:  [id number], DOB:  Mar 25, 1966, LOS: 74 ADMISSION DATE:  11/16/2019, CONSULTATION DATE:  12/2 REFERRING MD:  Dr. Sedonia Small, CHIEF COMPLAINT:  Post cardiac arrest    Brief History   53yo male presented post PEA arrest which occurred during iHD treatment as outpatient. This was a witnessed arrest with quick bystander CPR. Received 3 rounds of epi, 1g of calcium and 50 mEq of bicarb with ROSC after 15 minutes.   Emergent cricothyrotomy performed by EMS due to failed intubation with [facility] airway.   Past Medical History  NSVT 2017 Nonischemic cardiomyopathy Medical noncompliance Polysubstance abuse Hypertension End-stage renal disease Combined systolic and diastolic congestive heart failure Cocaine abuse Alcohol abuse Prolonged QTC  Significant Hospital Events   12/2 Admitted post cardiac arrest, emergent cric, 33 C TTM 12/8 V. tach arrest 12/10 Cric changed to tracheostomy 12/16 HD, tolerated only 30 minutes of trach collar 12/18 - He is critically ill, remains on vent. Soft blood pressure overnight but off pressors Afebrile. Rx HD 12/19 -  MAP > 65 with slow SBP. Doing PSV via trach. EP wants amio gtt cntinued through 12/21 and then decide on Dc. RN concerned about bilsters on feet and knees. She has called wound care. Not on sedation gtt. Following commands/. No active bleeding. Diarrhea + per RN 12/20: VVS recommends continued monitoring of LE and allowed necrosis to demarcate. Patient reportedly opposed to idea of amputaiton. Failed ATC and back on full vent. Amio gtt cotinues.  Afebrile.  Renam planning repeat HD v CRRT with pressor support 12/10/19 based on BP. Denies complaints other than LE pain. On TF 12/06/2019 complains of any pain with manipulation of lower extremities.  Currently on full mechanical ventilatory support.  No acute distress 12/23 did not tolerate wean well 12/24 Not tolerating wean, no events overnight 12/09/2019 -called by RN for  increased anxiety when attempted to come off vent on ATC. Pt with increased wob on atc, getting dialysis at this time. 12/27-12/29 Failed PS 12/30 - Remains volume overloaded. Place vas cath for CRRT  Consults:  Nephrology Electrophysiology ENT [place] 12/18   Procedures:  12/10-trach 12/4 RT fem HD cath >> out 12/2 RT [place] CVL >> 12/30 Plan for vas cath  Significant Diagnostic Tests:  CTA 12/2-segmental filling defects in the right middle lobe and right lower lobe pulmonary arteries, pneumomediastinum, subcu emphysema CT head 12/8-no acute abnormality  Echocardiogram 11/17/19-LVEF less than 20%, normal LV systolic function  Micro Data:  COVID 12/2 >> negative Aspirate 12/04 >> + for Haemophilus influenza  12/10 MRSA Nasal swab >> Positive 12/11 [place] >> negative 12/11 Respiratory CX >> Staph Epidermidis  Antimicrobials:  Unasyn 12/4>>12/7 Ceftriazone 12/7>>12/13  Interim history/subjective:  Remains on mechanical ventilation. Vas cath placed this morning  Objective   Blood pressure (!) 141/65, pulse 97, temperature 98.8 F (37.1 C), temperature source Oral, resp. rate (!) 29, height 5' 10.5" (1.791 m), weight 80.4 kg, SpO2 95 %.    Vent Mode: PSV;CPAP FiO2 (%):  [40 %] 40 % Set Rate:  [20 bmp] 20 bmp Vt Set:  [590 mL] 590 mL PEEP:  [5 cmH20] 5 cmH20 Pressure Support:  [14 cmH20] 14 cmH20 Plateau Pressure:  [19 cmH20-24 cmH20] 22 cmH20   Intake/Output Summary (Last 24 hours) at 12/14/2019 1018 Last data filed at 12/14/2019 1013 Gross per 24 hour  Intake 2680 ml  Output 3016 ml  Net -336 ml   Filed Weights   12/13/19 0413 12/13/19 1300 12/13/19 1615  Weight: 83.1 kg 82.8 kg 80.4 kg   Recent Labs    12/13/19 0458 12/14/19 0440  HGB 8.2* 8.0*   Physical Exam: General: Well-appearing, no acute distress HENT: [place], AT, OP clear, MMM Neck: Trach in place, c/d/i Eyes: EOMI, no scleral icterus Respiratory: Diminished breath sounds bilaterally.  No crackles, wheezing  or rales Cardiovascular: RRR, -M/R/G, no JVD GI: BS+, soft, nontender Extremities: LUE AVF,-tenderness Neuro: Awake, alert, follows commands, moves extremities x 4 Skin: Hyperpigmented demarcation in lower extremities bilaterally GU: Rectal pouch in place  Resolved Hospital Problem list   Ileus Cardiogenic shock   Assessment & Plan:   S/p PEA arrest on 12/2 S/p VT/VF cardiac arrest on 12/8 with ongoing NSVT Doubt PE but contrast timing poor, venous duplex negative, prolonged QT Plan -Continue amiodarone. Taper per EP -Not a candidate for ICD per EP. Per note from 12/29, Lifevest would only be arranged AFTER discharge home/facility -Telemetry -Palliative meeting scheduled for today  Acute on chronic respiratory failure s/p tracheostomy Difficult intubation needing cric in field s/p exchange for tracheostomy  Remains difficult to wean off vent due to volume status (+7L CFB) in setting of ESRD and heart failure, deconditioning. Will require facility to continue vent weaning. Plan -PS as tolerated -Full vent support -Start CRRT for volume removal once vas cath placed  Combined systolic and diastolic CHF EF <02% Chronic atrial fibrillation Plan -Amiodarone  -Telemetry -Switch anticoagulation to heparin gtt for CRRT clotting -Dialysis for volume removal  RML and RLL subsegmental PE on 12/2 -Heparin gtt  ESRD Plan -Appreciate Nephrology input. Dialysis today -Trend BMP -Continue midodrine 10 mg TID   Ischemic changes of feet  -due to low flow, obtain vascular input, now off IV heparin. Seen by VVS 12/18 - VVS concerned that patient at risk for amputation of lower extremity and blisters are signaling ischemia. Recommending supportive care and await demarcation. Patient not interested in amputation Plan  -Wound care involved -Vascular vein surgery is following. Appreciate input. Last note 12/28 -Continue anticoagulation  Anxiety Plan -As needed Xanax  Hx  polysustance abuse- cocaine, alcohol, tobacco Plan -Will need cessation counseling but doubt will continue to be an issue given current condition  Chronic Anemia, multifactorial -chronic illness, critical illness, blood loss related to frequent lab draws Plan -Trend daily CBC -Transfuse for Hg <7 -Aranesp per Nephrology  Goals of Care -Palliative Care has been very thoughtful with [place] discussions in setting of decompensated and end-stage chronic conditions and acute/recent critical illness. At present, family wishes for full scope of treatment. Appreciate Palliative Care continuing these discussions   Discussed with Social Work for placement options Change from HD to CRRT for more volume removal  Patient updated on plan at bedside today. I updated father, [name], on 12/28 on patient's current clinical condition and need for facility placement which will likely be difficult due to his ESRD and trach status.  LABS   PULMONARY Recent Labs  Lab 12/13/19 0458  PHART 7.517*  PCO2ART 35.2  PO2ART 103.0  HCO3 28.6*  TCO2 30  O2SAT 99.0    CBC Recent Labs  Lab 12/12/19 0308 12/13/19 0339 12/13/19 0458 12/14/19 0440  HGB 7.3* 8.7* 8.2* 8.0*  HCT 23.0* 28.6* 24.0* 26.0*  WBC 13.6* 13.7*  --  11.1*  PLT 364 363  --  287    COAGULATION No results for input(s): INR in the last 168 hours.  CARDIAC  No results for input(s): TROPONINI in the last 168 hours. No results for input(s):  PROBNP in the last 168 hours.   CHEMISTRY Recent Labs  Lab 12/08/19 0537 12/10/19 0402 12/10/19 1103 12/11/19 0302 12/12/19 0308 12/13/19 0339 12/13/19 0458 12/14/19 0440  NA 132* 133*  --  134* 133* 132* 130* 133*  K 4.6 5.3*  --  4.5 5.1 6.0* 5.1 4.7  CL 91* 91*  --  94* 91* 94*  --  92*  CO2 25 26  --  26 27 23   --  26  GLUCOSE 108* 94  --  107* 107* 94  --  96  BUN 48* 90*  --  56* 76* 74*  --  57*  CREATININE 4.24* 6.56*  --  4.49* 5.66* 5.44*  --  4.66*  CALCIUM 8.0* 7.9*  --   8.1* 8.3* 8.6*  --  8.4*  MG 2.0  --  2.0  --  2.1  --   --   --   PHOS 1.8* 3.2  --  2.8 3.1  3.2 3.7  --  3.5   Estimated Creatinine Clearance: 19.7 mL/min (A) (by C-G formula based on SCr of 4.66 mg/dL (H)).   LIVER Recent Labs  Lab 12/10/19 0402 12/11/19 0302 12/12/19 0308 12/13/19 0339 12/14/19 0440  AST 14*  --   --   --   --   ALT 14  --   --   --   --   ALKPHOS 168*  --   --   --   --   BILITOT 0.7  --   --   --   --   PROT 6.7  --   --   --   --   ALBUMIN 2.3* 2.3* 2.3* 2.6* 2.3*     INFECTIOUS No results for input(s): LATICACIDVEN, PROCALCITON in the last 168 hours.   ENDOCRINE CBG (last 3)  Recent Labs    12/13/19 2344 12/14/19 0358 12/14/19 0710  GLUCAP 94 86 96   The patient is critically ill with multiple organ systems failure and requires high complexity decision making for assessment and support, frequent evaluation and titration of therapies, application of advanced monitoring technologies and extensive interpretation of multiple databases.   Critical Care Time devoted to patient care services described in this note is 39 Minutes. This time reflects time of care of this signee Dr. Rodman Pickle.   Rodman Pickle, M.D. [facility] [place] Pulmonary/Critical Care Medicine 12/14/2019 10:19 AM   Please see Amion for pager number to reach on-call Pulmonary and Critical Care Team.

## 2019-12-15 ENCOUNTER — Inpatient Hospital Stay (HOSPITAL_COMMUNITY): Payer: Medicare Other

## 2019-12-15 DIAGNOSIS — Z9911 Dependence on respirator [ventilator] status: Secondary | ICD-10-CM

## 2019-12-15 DIAGNOSIS — K567 Ileus, unspecified: Secondary | ICD-10-CM

## 2019-12-15 DIAGNOSIS — J9611 Chronic respiratory failure with hypoxia: Secondary | ICD-10-CM

## 2019-12-15 DIAGNOSIS — I2699 Other pulmonary embolism without acute cor pulmonale: Secondary | ICD-10-CM

## 2019-12-15 LAB — RENAL FUNCTION PANEL
Albumin: 2.6 g/dL — ABNORMAL LOW (ref 3.5–5.0)
Albumin: 3 g/dL — ABNORMAL LOW (ref 3.5–5.0)
Anion gap: 14 (ref 5–15)
Anion gap: 12 (ref 5–15)
BUN: 47 mg/dL — ABNORMAL HIGH (ref 6–20)
BUN: 37 mg/dL — ABNORMAL HIGH (ref 6–20)
CO2: 24 mmol/L (ref 22–32)
CO2: 26 mmol/L (ref 22–32)
Calcium: 8.3 mg/dL — ABNORMAL LOW (ref 8.9–10.3)
Calcium: 8.9 mg/dL (ref 8.9–10.3)
Chloride: 96 mmol/L — ABNORMAL LOW (ref 98–111)
Chloride: 97 mmol/L — ABNORMAL LOW (ref 98–111)
Creatinine, Ser: 3.36 mg/dL — ABNORMAL HIGH (ref 0.61–1.24)
Creatinine, Ser: 2.64 mg/dL — ABNORMAL HIGH (ref 0.61–1.24)
GFR calc Af Amer: 23 mL/min — ABNORMAL LOW (ref >60)
GFR calc Af Amer: 31 mL/min — ABNORMAL LOW (ref >60)
GFR calc non Af Amer: 20 mL/min — ABNORMAL LOW (ref >60)
GFR calc non Af Amer: 27 mL/min — ABNORMAL LOW (ref >60)
Glucose, Bld: 101 mg/dL — ABNORMAL HIGH (ref 70–99)
Glucose, Bld: 104 mg/dL — ABNORMAL HIGH (ref 70–99)
Phosphorus: 3 mg/dL (ref 2.5–4.6)
Phosphorus: 2.9 mg/dL (ref 2.5–4.6)
Potassium: 5 mmol/L (ref 3.5–5.1)
Potassium: 5.3 mmol/L — ABNORMAL HIGH (ref 3.5–5.1)
Sodium: 134 mmol/L — ABNORMAL LOW (ref 135–145)
Sodium: 135 mmol/L (ref 135–145)

## 2019-12-15 LAB — APTT
aPTT: >200 seconds (ref 24–36)
aPTT: >200 seconds (ref 24–36)
aPTT: 144 seconds — ABNORMAL HIGH (ref 24–36)
aPTT: 93 seconds — ABNORMAL HIGH (ref 24–36)

## 2019-12-15 LAB — POCT ACTIVATED CLOTTING TIME
Activated Clotting Time: 175 seconds
Activated Clotting Time: 180 seconds
Activated Clotting Time: 180 seconds
Activated Clotting Time: 191 seconds
Activated Clotting Time: 191 seconds
Activated Clotting Time: 191 seconds
Activated Clotting Time: 191 seconds
Activated Clotting Time: 202 seconds

## 2019-12-15 LAB — CBC
HCT: 25.5 % — ABNORMAL LOW (ref 39.0–52.0)
Hemoglobin: 7.7 g/dL — ABNORMAL LOW (ref 13.0–17.0)
MCH: 27.7 pg (ref 26.0–34.0)
MCHC: 30.2 g/dL (ref 30.0–36.0)
MCV: 91.7 fL (ref 80.0–100.0)
Platelets: 291 10*3/uL (ref 150–400)
RBC: 2.78 MIL/uL — ABNORMAL LOW (ref 4.22–5.81)
RDW: 17.3 % — ABNORMAL HIGH (ref 11.5–15.5)
WBC: 12.4 10*3/uL — ABNORMAL HIGH (ref 4.0–10.5)
nRBC: 0 % (ref 0.0–0.2)

## 2019-12-15 LAB — GLUCOSE, CAPILLARY
Glucose-Capillary: 83 mg/dL (ref 70–99)
Glucose-Capillary: 82 mg/dL (ref 70–99)
Glucose-Capillary: 94 mg/dL (ref 70–99)
Glucose-Capillary: 79 mg/dL (ref 70–99)
Glucose-Capillary: 67 mg/dL — ABNORMAL LOW (ref 70–99)
Glucose-Capillary: 78 mg/dL (ref 70–99)
Glucose-Capillary: 68 mg/dL — ABNORMAL LOW (ref 70–99)

## 2019-12-15 LAB — MAGNESIUM: Magnesium: 2.3 mg/dL (ref 1.7–2.4)

## 2019-12-15 LAB — HEPARIN LEVEL (UNFRACTIONATED): Heparin Unfractionated: >2.2 IU/mL — ABNORMAL HIGH (ref 0.30–0.70)

## 2019-12-15 MED ORDER — OXYCODONE HCL 5 MG/5ML PO SOLN
7.5000 mg | ORAL | Status: DC | PRN
  Administered 2019-12-15 – 2019-12-23 (×26): 7.5 mg via ORAL
  Filled 2019-12-15 (×26): qty 10

## 2019-12-15 MED ORDER — DEXTROSE 50 % IV SOLN
INTRAVENOUS | Status: AC
  Administered 2019-12-15: 23:00:00 50 mL
  Filled 2019-12-15: qty 50

## 2019-12-15 MED ORDER — DEXTROSE 50 % IV SOLN
INTRAVENOUS | Status: AC
  Administered 2019-12-15: 20:00:00 25 mL
  Filled 2019-12-15: qty 50

## 2019-12-15 MED ORDER — FENTANYL CITRATE (PF) 100 MCG/2ML IJ SOLN
50.0000 ug | INTRAMUSCULAR | Status: DC | PRN
  Administered 2019-12-15 – 2019-12-23 (×24): 50 ug via INTRAVENOUS
  Filled 2019-12-15 (×24): qty 2

## 2019-12-15 NOTE — Progress Notes (Signed)
[place] for heparin  Indication: atrial fibrillation  Allergies  Allergen Reactions  . Losartan Cough    Patient Measurements: Height: 5' 10.5" (179.1 cm) Weight: 177 lb 4 oz (80.4 kg) IBW/kg (Calculated) : 74.15  Vital Signs: Temp: 97.3 F (36.3 C) (12/31 0300) Temp Source: Oral (12/31 0300) BP: 148/88 (12/31 0500) Pulse Rate: 85 (12/31 0400)  Labs: Recent Labs    12/13/19 0339 12/13/19 0458 12/14/19 0440 12/15/19 0241 12/15/19 0506  HGB 8.7* 8.2* 8.0* 7.7*  --   HCT 28.6* 24.0* 26.0* 25.5*  --   PLT 363  --  287 291  --   APTT  --   --   --  >200* >200*  HEPARINUNFRC  --   --   --  >2.20*  --   CREATININE 5.44*  --  4.66* 3.36*  --     Estimated Creatinine Clearance: 27.3 mL/min (A) (by C-G formula based on SCr of 3.36 mg/dL (H)).  Assessment: 52 y.o. male with h/o Afib, Eliquis on hold, for heparin  Goal of Therapy:  Heparin level 0.3-0.7 units/ml Monitor platelets by anticoagulation protocol: Yes   Plan:  Decrease Heparin 900 units/hr APTT in 8 hours  Phillis Knack, PharmD, BCPS  12/15/2019 6:04 AM

## 2019-12-15 NOTE — Progress Notes (Addendum)
CRITICAL VALUE ALERT  Critical Value:  PTT >200   Date & Time Notied:  12/15/2019 ; 1975  Provider Notified: Elink notified  Orders Received/Actions taken: Patient currently on CRRT and was previously on eliquis- added heparin gtt. Will monitor closely. - Pharmacy called and ordered recheck of PTT.   Milford Cage, RN

## 2019-12-15 NOTE — Progress Notes (Signed)
Patient ID: [name] [name], male   DOB: 1966/08/05, 53 y.o.   MRN: [id number]  [facility] Progress Note   Assessment/ Plan:   1. Status post PEA and V. tach arrest: Cardiogenic shock resolved, remains on amiodarone and Eliquis. Not candidate for ICD with tracheostomy in place as well as previous concerns of adherence/drug use. 2. Pulm edema/ vol overload - was not responding to serial HD, now getting CRRT started 12/14/19. 2.6L off yest, cont vol removal and get f/u CXR in am. Still wet.  3. ESRD - usual HD MWF, now CRRT as above 4. Anemia: Without overt blood loss, low hemoglobin/hematocrit noted with last Aranesp dose on 12/22. 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 6. Nutrition: Ongoing tube feeds at this time, restart renal multivitamin. 7. Bilateral lower extremity ischemia: seen by vascular surgery with brisk lower extremity Doppler signals noted; allowing demarcation to decide on need for surgical intervention.  8. Palliative care: appreciate PC's assistance   Kelly Splinter, MD 12/15/2019, 11:58 AM    Subjective:   2.6 L net neg yest and CRRT running well w/o issues. PTT high so in-circuit hep Was dc'd and systemic heparin continued   Objective:   BP (!) 157/100   Pulse (!) 108   Temp (!) 97.3 F (36.3 C) (Oral)   Resp (!) 26   Ht 5' 10.5" (1.791 m)   Wt 76.6 kg   SpO2 100%   BMI 23.89 kg/m   Physical Exam: Gen: tracheostomy in place, NGT in situ. CVS: Pulse regular rhythm, normal rate, S1 and S2 normal Resp: bilat coarse BS Abd: Soft, mildly distended, bowel sounds normal Ext: 2+ RUE edema, +depenent hip edema,  bilateral leg/ankle wrapped in clean gauze.  Ischemic changes of digits noted.  Left upper arm AV fistula with hyperpulsatility, intact dressings.   HD OP: [place] MWF  4h 65mn  74.5kg  2/2.5 bath  340/A1.5   P4  Hep 5000     C3 0.75 qTx     no ESA/Fe     Hb 12s usually    Medications:     . amiodarone  400 mg Per Tube Daily  . chlorhexidine gluconate (MEDLINE KIT)  15 mL Mouth Rinse BID  . Chlorhexidine Gluconate Cloth  6 each Topical Q0600  . darbepoetin (ARANESP) injection - DIALYSIS  100 mcg Intravenous Q Wed-HD  . docusate  100 mg Per Tube Daily  . feeding supplement (PRO-STAT SUGAR FREE 64)  30 mL Per Tube 5 X Daily  . insulin aspart  0-9 Units Subcutaneous Q4H  . mouth rinse  15 mL Mouth Rinse 10 times per day  . midodrine  10 mg Per Tube Q8H  . multivitamin  1 tablet Oral QHS  . pantoprazole sodium  40 mg Per Tube QHS  . sodium chloride flush  10-40 mL Intracatheter Q12H  . sodium chloride flush  10-40 mL Intracatheter Q12H  . white petrolatum   Topical Daily

## 2019-12-15 NOTE — Progress Notes (Signed)
[place] for heparin (Holding apixaban) Indication: atrial fibrillation  Allergies  Allergen Reactions  . Losartan Cough    Patient Measurements: Height: 5' 10.5" (179.1 cm) Weight: 168 lb 14 oz (76.6 kg) IBW/kg (Calculated) : 74.15  Vital Signs: Temp: 97.4 F (36.3 C) (12/31 1948) Temp Source: Oral (12/31 1948) BP: 146/89 (12/31 2000) Pulse Rate: 94 (12/31 1921)  Labs: Recent Labs    12/13/19 0339 12/13/19 0458 12/13/19 0458 12/14/19 0440 12/15/19 0241 12/15/19 0506 12/15/19 1105 12/15/19 1617 12/15/19 2009  HGB 8.7* 8.2*  --  8.0* 7.7*  --   --   --   --   HCT 28.6* 24.0*  --  26.0* 25.5*  --   --   --   --   PLT 363  --   --  287 291  --   --   --   --   APTT  --   --    < >  --  >200* >200* 144*  --  93*  HEPARINUNFRC  --   --   --   --  >2.20*  --   --   --   --   CREATININE 5.44*  --   --  4.66* 3.36*  --   --  2.64*  --    < > = values in this interval not displayed.    Estimated Creatinine Clearance: 34.7 mL/min (A) (by C-G formula based on SCr of 2.64 mg/dL (H)).  Assessment: 53 yo m with afib on apixaban. Needed to restart CRRT this am, so plan was to leave on apixaban.  However, filter clotted twice when starting, so decision made to hold apixaban and convert to IV heparin  Last apixaban dose 1000 12/30  Was started on CRRT yesterday along with both circuit and IV heparin APTT this am was > 200, so dc circuit heparin and stick with IV  APTT still elevated but improved this afternoon - 144s  No issues with circuit or bleeding per RN  PM follow up - aPTT now within goal range.  Goal of Therapy:  Heparin level 0.3-0.7 units/ml  APTT 66 - 102 s Monitor platelets by anticoagulation protocol: Yes   Plan:  Continue IV heparin at current rate. Recheck aPTT with morning labs.  Marguerite Olea, [facility] Clinical Pharmacist Phone [phone]  12/15/2019 8:51 PM    Please check AMION for all  [place] numbers  12/15/2019 8:51 PM

## 2019-12-15 NOTE — Progress Notes (Signed)
Patient with swelling under skin bilaterally on sides of neck/upper shoulder area. Swelling became apparent after HD catheter placed. Chest X ray ordered, did not show subcutaneous air. Continuing to monitor. No improvement over shift.  Milford Cage, RN

## 2019-12-15 NOTE — Progress Notes (Signed)
ANTICOAGULATION CONSULT NOTE - Initial Consult  Pharmacy Consult for heparin (Holding apixaban) Indication: atrial fibrillation  Allergies  Allergen Reactions  . Losartan Cough    Patient Measurements: Height: 5' 10.5" (179.1 cm) Weight: 168 lb 14 oz (76.6 kg) IBW/kg (Calculated) : 74.15  Vital Signs: Temp: 97.3 F (36.3 C) (12/31 1141) Temp Source: Oral (12/31 1141) BP: 169/105 (12/31 1300) Pulse Rate: 97 (12/31 1114)  Labs: Recent Labs    12/13/19 0339 12/13/19 0458 12/14/19 0440 12/15/19 0241 12/15/19 0506 12/15/19 1105  HGB 8.7* 8.2* 8.0* 7.7*  --   --   HCT 28.6* 24.0* 26.0* 25.5*  --   --   PLT 363  --  287 291  --   --   APTT  --   --   --  >200* >200* 144*  HEPARINUNFRC  --   --   --  >2.20*  --   --   CREATININE 5.44*  --  4.66* 3.36*  --   --     Estimated Creatinine Clearance: 27.3 mL/min (A) (by C-G formula based on SCr of 3.36 mg/dL (H)).  Assessment: 53 yo m with afib on apixaban. Needed to restart CRRT this am, so plan was to leave on apixaban.  However, filter clotted twice when starting, so decision made to hold apixaban and convert to IV heparin  Last apixaban dose 1000 12/30  Was started on CRRT yesterday along with both circuit and IV heparin APTT this am was > 200, so dc circuit heparin and stick with IV  APTT still elevated but improved this afternoon - 144s  No issues with circuit or bleeding per RN  Goal of Therapy:  Heparin level 0.3-0.7 units/ml  APTT 66 - 102 s Monitor platelets by anticoagulation protocol: Yes   Plan:  Reduce heparin to 700 units/hr Recheck aPTT at 2000 Daily aptt hl cbc  Barth Kirks, PharmD, BCPS, BCCCP Clinical Pharmacist [phone]  Please check AMION for all [place] numbers  12/15/2019 1:13 PM

## 2019-12-15 NOTE — Progress Notes (Signed)
Discussed with Dr Rayann Heman  EP continues to follow from a distance.   No further sustained VT. Not candidate for ICD.   If he goes to [facility], will need to arrange LifeVest at discharge from that facility.  Will arrange EP follow up in 6 weeks in office.   Chanetta Marshall, NP 12/15/2019 8:52 AM

## 2019-12-15 NOTE — Progress Notes (Signed)
NAME:  [name] [name], MRN:  [id number], DOB:  [birth year], LOS: 4 ADMISSION DATE:  11/16/2019, CONSULTATION DATE:  12/2 REFERRING MD:  Dr. Sedonia Small, CHIEF COMPLAINT:  Post cardiac arrest    Brief History   53yo male presented post PEA arrest which occurred during iHD treatment as outpatient. This was a witnessed arrest with quick bystander CPR. Received 3 rounds of epi, 1g of calcium and 50 mEq of bicarb with ROSC after 15 minutes.   Emergent cricothyrotomy performed by EMS due to failed intubation with [facility] airway.   Past Medical History  NSVT 2017 Nonischemic cardiomyopathy Medical noncompliance Polysubstance abuse Hypertension End-stage renal disease Combined systolic and diastolic congestive heart failure Cocaine abuse Alcohol abuse Prolonged QTC  Significant Hospital Events   12/2 Admitted post cardiac arrest, emergent cric, 33 C TTM 12/8 V. tach arrest 12/10 Cric changed to tracheostomy 12/16 HD, tolerated only 30 minutes of trach collar 12/18 - He is critically ill, remains on vent. Soft blood pressure overnight but off pressors Afebrile. Rx HD 12/19 -  MAP > 65 with slow SBP. Doing PSV via trach. EP wants amio gtt cntinued through 12/21 and then decide on Dc. RN concerned about bilsters on feet and knees. She has called wound care. Not on sedation gtt. Following commands/. No active bleeding. Diarrhea + per RN 12/20: VVS recommends continued monitoring of LE and allowed necrosis to demarcate. Patient reportedly opposed to idea of amputaiton. Failed ATC and back on full vent. Amio gtt cotinues.  Afebrile.  Renam planning repeat HD v CRRT with pressor support 12/10/19 based on BP. Denies complaints other than LE pain. On TF 12/06/2019 complains of any pain with manipulation of lower extremities.  Currently on full mechanical ventilatory support.  No acute distress 12/23 did not tolerate wean well 12/24 Not tolerating wean, no events overnight 12/09/2019 -called by RN for  increased anxiety when attempted to come off vent on ATC. Pt with increased wob on atc, getting dialysis at this time. 12/27-12/29 Failed PS 12/30 - Remains volume overloaded. Place vas cath for CRRT  Consults:  Nephrology Electrophysiology ENT [place] 12/18   Procedures:  12/10-trach 12/4 RT fem HD cath >> out 12/2 RT [place] CVL >> 12/30 trialysis  Significant Diagnostic Tests:  CTA 12/2-segmental filling defects in the right middle lobe and right lower lobe pulmonary arteries, pneumomediastinum, subcu emphysema CT head 12/8-no acute abnormality  Echocardiogram 11/17/19-LVEF less than 20%, normal LV systolic function  Micro Data:  COVID 12/2 >> negative Aspirate 12/04 >> + for Haemophilus influenza  12/10 MRSA Nasal swab >> Positive 12/11 [place] >> negative 12/11 Respiratory CX >> Staph Epidermidis  Antimicrobials:  Unasyn 12/4>>12/7 Ceftriazone 12/7>>12/13  Interim history/subjective:  On MV. Complaining of central CP and abdominal distention this morning.   Objective   Blood pressure (!) 157/100, pulse (!) 108, temperature (!) 97.4 F (36.3 C), temperature source Oral, resp. rate (!) 26, height 5' 10.5" (1.791 m), weight 76.6 kg, SpO2 100 %.    Vent Mode: PSV;CPAP FiO2 (%):  [40 %] 40 % Set Rate:  [20 bmp] 20 bmp Vt Set:  [590 mL] 590 mL PEEP:  [5 cmH20] 5 cmH20 Pressure Support:  [8 GDJ24-26 cmH20] 8 cmH20 Plateau Pressure:  [22 cmH20-27 cmH20] 27 cmH20   Intake/Output Summary (Last 24 hours) at 12/15/2019 1134 Last data filed at 12/15/2019 1100 Gross per 24 hour  Intake 1355.54 ml  Output 4925 ml  Net -3569.46 ml   Filed Weights   12/13/19 1300 12/13/19  1615 12/15/19 0500  Weight: 82.8 kg 80.4 kg 76.6 kg   Recent Labs    12/14/19 0440 12/15/19 0241  HGB 8.0* 7.7*   Physical Exam: General: critically ill appearing gentleman laying in bed in mild distress due to pain HENT: [place]/AT, eyes anicteric, oral mucosa moist Neck: trach in place, inner cannula changed  due to occlusion with thick secretions this morning. Eyes: anicteric, EOMI Respiratory:rhonchi bilaterally, bloody thick secretions from trach Cardiovascular: tachycardic, reg rhythm GI: distended, TTP, tympanitic to percussion. Extremities: foot edema R>L, no clubbing or cyanosis Neuro: awake and alert, moving all extremities, answering y/n quesitons Skin: Hyperpigmented demarcation in lower extremities bilaterally, but no rashes   Resolved Hospital Problem list   Ileus Cardiogenic shock   Assessment & Plan:   S/p PEA arrest on 12/2 S/p VT/VF cardiac arrest on 12/8 with ongoing NSVT Doubt PE but contrast timing poor, venous duplex negative, prolonged QT Plan -Continue oral amiodarone, appreciate EP's recs -Not a candidate for ICD per EP. Per note from 12/29, [facility] would only be arranged AFTER discharge home/facility -con't tele -Palliative meeting on 12/30- appreciate their input. The goal is for vent liberation , and they understand he is unlikely to return to his pre-morbid level if function  Acute on chronic respiratory failure s/p tracheostomy Difficult intubation needing cric in field s/p exchange for tracheostomy  Remains difficult to wean off vent due to volume status (+7L CFB) in setting of ESRD and heart failure, deconditioning. Will require facility to continue vent weaning. Plan -con't PS trials as tolerated- on hold today due to distention -volume management per CRRT  Ileus vs constipation -NGT placed to LIWS -enema, then remove rectal tube -bowel regimen once having BMs -pain control PRN  Combined systolic and diastolic CHF EF <17% Chronic atrial fibrillation Plan -amiodarone -monitor on tele -heparin infusion due to clotting issues with CRRT -CRRT for volume removal  RML and RLL subsegmental PE on 12/2 -heparin infusion  ESRD Plan -appreciate nephro's input -CRRT -Continue midodrine 10 mg TID   Ischemic changes of feet  -due to low flow,  obtain vascular input, now off IV heparin. Seen by VVS 12/18 - VVS concerned that patient at risk for amputation of lower extremity and blisters are signaling ischemia. Recommending supportive care and await demarcation. Patient not interested in amputation Plan  -appreciate wound care & vascular's assistance -Continue anticoagulation  Anxiety Plan -PRN alprazolam  Hx polysustance abuse- cocaine, alcohol, tobacco Plan -counseling when appropriate  Chronic Anemia, multifactorial -chronic illness, critical illness, blood loss related to frequent lab draws Plan -daily CBC -Transfuse for Hb <7 -Aranesp per Nephrology  Goals of Care -Palliative Care has been very thoughtful with [place] discussions in setting of decompensated and end-stage chronic conditions and acute/recent critical illness. At present, family wishes for full scope of treatment. Appreciate Palliative Care continuing these discussions   Discussed with Social Work for placement options Change from HD to CRRT for more volume removal    LABS   PULMONARY Recent Labs  Lab 12/13/19 0458  PHART 7.517*  PCO2ART 35.2  PO2ART 103.0  HCO3 28.6*  TCO2 30  O2SAT 99.0    CBC Recent Labs  Lab 12/13/19 0339 12/13/19 0458 12/14/19 0440 12/15/19 0241  HGB 8.7* 8.2* 8.0* 7.7*  HCT 28.6* 24.0* 26.0* 25.5*  WBC 13.7*  --  11.1* 12.4*  PLT 363  --  287 291    COAGULATION No results for input(s): INR in the last 168 hours.  CARDIAC  No  results for input(s): TROPONINI in the last 168 hours. No results for input(s): PROBNP in the last 168 hours.   CHEMISTRY Recent Labs  Lab 12/10/19 1103 12/11/19 0302 12/12/19 0308 12/13/19 0339 12/13/19 0458 12/14/19 0440 12/15/19 0241  NA  --  134* 133* 132* 130* 133* 134*  K  --  4.5 5.1 6.0* 5.1 4.7 5.0  CL  --  94* 91* 94*  --  92* 96*  CO2  --  26 27 23   --  26 24  GLUCOSE  --  107* 107* 94  --  96 101*  BUN  --  56* 76* 74*  --  57* 47*  CREATININE  --  4.49*  5.66* 5.44*  --  4.66* 3.36*  CALCIUM  --  8.1* 8.3* 8.6*  --  8.4* 8.3*  MG 2.0  --  2.1  --   --   --  2.3  PHOS  --  2.8 3.1  3.2 3.7  --  3.5 3.0   Estimated Creatinine Clearance: 27.3 mL/min (A) (by C-G formula based on SCr of 3.36 mg/dL (H)).   LIVER Recent Labs  Lab 12/10/19 0402 12/11/19 0302 12/12/19 0308 12/13/19 0339 12/14/19 0440 12/15/19 0241  AST 14*  --   --   --   --   --   ALT 14  --   --   --   --   --   ALKPHOS 168*  --   --   --   --   --   BILITOT 0.7  --   --   --   --   --   PROT 6.7  --   --   --   --   --   ALBUMIN 2.3* 2.3* 2.3* 2.6* 2.3* 2.6*     INFECTIOUS No results for input(s): LATICACIDVEN, PROCALCITON in the last 168 hours.   ENDOCRINE CBG (last 3)  Recent Labs    12/14/19 2305 12/15/19 0302 12/15/19 0719  GLUCAP 94 83 82     This patient is critically ill with multiple organ system failure which requires frequent high complexity decision making, assessment, support, evaluation, and titration of therapies. This was completed through the application of advanced monitoring technologies and extensive interpretation of multiple databases. During this encounter critical care time was devoted to patient care services described in this note for 50 minutes.  Julian Hy, DO 12/15/19 11:52 AM [facility]

## 2019-12-16 ENCOUNTER — Inpatient Hospital Stay (HOSPITAL_COMMUNITY): Payer: Medicare Other

## 2019-12-16 DIAGNOSIS — E162 Hypoglycemia, unspecified: Secondary | ICD-10-CM

## 2019-12-16 DIAGNOSIS — J181 Lobar pneumonia, unspecified organism: Secondary | ICD-10-CM

## 2019-12-16 LAB — CBC
HCT: 27.5 % — ABNORMAL LOW (ref 39.0–52.0)
Hemoglobin: 8 g/dL — ABNORMAL LOW (ref 13.0–17.0)
MCH: 27.6 pg (ref 26.0–34.0)
MCHC: 29.1 g/dL — ABNORMAL LOW (ref 30.0–36.0)
MCV: 94.8 fL (ref 80.0–100.0)
Platelets: 342 10*3/uL (ref 150–400)
RBC: 2.9 MIL/uL — ABNORMAL LOW (ref 4.22–5.81)
RDW: 17.7 % — ABNORMAL HIGH (ref 11.5–15.5)
WBC: 17 10*3/uL — ABNORMAL HIGH (ref 4.0–10.5)
nRBC: 0.2 % (ref 0.0–0.2)

## 2019-12-16 LAB — RENAL FUNCTION PANEL
Albumin: 2.8 g/dL — ABNORMAL LOW (ref 3.5–5.0)
Albumin: 2.6 g/dL — ABNORMAL LOW (ref 3.5–5.0)
Anion gap: 15 (ref 5–15)
Anion gap: 15 (ref 5–15)
BUN: 32 mg/dL — ABNORMAL HIGH (ref 6–20)
BUN: 35 mg/dL — ABNORMAL HIGH (ref 6–20)
CO2: 24 mmol/L (ref 22–32)
CO2: 23 mmol/L (ref 22–32)
Calcium: 8.9 mg/dL (ref 8.9–10.3)
Calcium: 8.7 mg/dL — ABNORMAL LOW (ref 8.9–10.3)
Chloride: 97 mmol/L — ABNORMAL LOW (ref 98–111)
Chloride: 97 mmol/L — ABNORMAL LOW (ref 98–111)
Creatinine, Ser: 2.22 mg/dL — ABNORMAL HIGH (ref 0.61–1.24)
Creatinine, Ser: 2.29 mg/dL — ABNORMAL HIGH (ref 0.61–1.24)
GFR calc Af Amer: 38 mL/min — ABNORMAL LOW (ref >60)
GFR calc Af Amer: 37 mL/min — ABNORMAL LOW (ref >60)
GFR calc non Af Amer: 33 mL/min — ABNORMAL LOW (ref >60)
GFR calc non Af Amer: 32 mL/min — ABNORMAL LOW (ref >60)
Glucose, Bld: 79 mg/dL (ref 70–99)
Glucose, Bld: 98 mg/dL (ref 70–99)
Phosphorus: 2.8 mg/dL (ref 2.5–4.6)
Phosphorus: 2.6 mg/dL (ref 2.5–4.6)
Potassium: 5.2 mmol/L — ABNORMAL HIGH (ref 3.5–5.1)
Potassium: 4.6 mmol/L (ref 3.5–5.1)
Sodium: 136 mmol/L (ref 135–145)
Sodium: 135 mmol/L (ref 135–145)

## 2019-12-16 LAB — GLUCOSE, CAPILLARY
Glucose-Capillary: 104 mg/dL — ABNORMAL HIGH (ref 70–99)
Glucose-Capillary: 116 mg/dL — ABNORMAL HIGH (ref 70–99)
Glucose-Capillary: 123 mg/dL — ABNORMAL HIGH (ref 70–99)
Glucose-Capillary: 145 mg/dL — ABNORMAL HIGH (ref 70–99)
Glucose-Capillary: 184 mg/dL — ABNORMAL HIGH (ref 70–99)
Glucose-Capillary: 49 mg/dL — ABNORMAL LOW (ref 70–99)
Glucose-Capillary: 55 mg/dL — ABNORMAL LOW (ref 70–99)
Glucose-Capillary: 55 mg/dL — ABNORMAL LOW (ref 70–99)
Glucose-Capillary: 57 mg/dL — ABNORMAL LOW (ref 70–99)
Glucose-Capillary: 63 mg/dL — ABNORMAL LOW (ref 70–99)
Glucose-Capillary: 70 mg/dL (ref 70–99)
Glucose-Capillary: 76 mg/dL (ref 70–99)
Glucose-Capillary: 82 mg/dL (ref 70–99)
Glucose-Capillary: 88 mg/dL (ref 70–99)

## 2019-12-16 LAB — APTT: aPTT: 70 seconds — ABNORMAL HIGH (ref 24–36)

## 2019-12-16 LAB — MAGNESIUM: Magnesium: 2.6 mg/dL — ABNORMAL HIGH (ref 1.7–2.4)

## 2019-12-16 LAB — HEPARIN LEVEL (UNFRACTIONATED): Heparin Unfractionated: 1.7 IU/mL — ABNORMAL HIGH (ref 0.30–0.70)

## 2019-12-16 MED ORDER — MIDODRINE HCL 5 MG PO TABS
5.0000 mg | ORAL_TABLET | Freq: Three times a day (TID) | ORAL | Status: DC
  Administered 2019-12-16 – 2019-12-20 (×13): 5 mg
  Filled 2019-12-16 (×14): qty 1

## 2019-12-16 MED ORDER — VITAL 1.5 CAL PO LIQD
1000.0000 mL | ORAL | Status: DC
  Administered 2019-12-16: 1000 mL
  Filled 2019-12-16: qty 1000

## 2019-12-16 MED ORDER — DEXTROSE 10 % IV SOLN: INTRAVENOUS | Status: DC

## 2019-12-16 MED ORDER — PIPERACILLIN-TAZOBACTAM 3.375 G IVPB
3.3750 g | Freq: Three times a day (TID) | INTRAVENOUS | Status: DC
  Filled 2019-12-16: qty 50

## 2019-12-16 MED ORDER — DEXTROSE 50 % IV SOLN
INTRAVENOUS | Status: AC
  Administered 2019-12-16: 50 mL
  Filled 2019-12-16: qty 50

## 2019-12-16 MED ORDER — DEXTROSE 50 % IV SOLN
INTRAVENOUS | Status: AC
  Administered 2019-12-17: 50 mL
  Filled 2019-12-16: qty 50

## 2019-12-16 MED ORDER — PIPERACILLIN-TAZOBACTAM 3.375 G IVPB
3.3750 g | Freq: Four times a day (QID) | INTRAVENOUS | Status: DC
  Administered 2019-12-16 – 2019-12-18 (×8): 3.375 g via INTRAVENOUS
  Filled 2019-12-16 (×9): qty 50

## 2019-12-16 MED ORDER — SENNOSIDES-DOCUSATE SODIUM 8.6-50 MG PO TABS
1.0000 | ORAL_TABLET | Freq: Two times a day (BID) | ORAL | Status: DC
  Administered 2019-12-16 – 2019-12-23 (×10): 1
  Filled 2019-12-16 (×11): qty 1

## 2019-12-16 MED ORDER — VANCOMYCIN HCL IN DEXTROSE 1-5 GM/200ML-% IV SOLN
1000.0000 mg | INTRAVENOUS | Status: DC
  Administered 2019-12-16 – 2019-12-18 (×3): 1000 mg via INTRAVENOUS
  Filled 2019-12-16 (×3): qty 200

## 2019-12-16 NOTE — Progress Notes (Signed)
Pharmacy Antibiotic Note  [name] [name] is a 54 y.o. male admitted on 11/16/2019 with pneumonia.  Pharmacy has been consulted for vancomycin and zosyn dosing.  Plan: Patient is ESRD on dialysis but will be initiated on CRRT per nephro. He remains difficult to wean off the vent and will be initiated on abx for PNA dosed for CRRT Zosyn 3.375 q6h Vancomycin 1000mg  IV q24h  Height: 5' 10.5" (179.1 cm) Weight: 158 lb 4.6 oz (71.8 kg) IBW/kg (Calculated) : 74.15  Temp (24hrs), Avg:97.5 F (36.4 C), Min:97.3 F (36.3 C), Max:97.9 F (36.6 C)  Recent Labs  Lab 12/12/19 0308 12/13/19 0339 12/14/19 0440 12/15/19 0241 12/15/19 1617 12/16/19 0301  WBC 13.6* 13.7* 11.1* 12.4*  --  17.0*  CREATININE 5.66* 5.44* 4.66* 3.36* 2.64* 2.22*    Estimated Creatinine Clearance: 40 mL/min (A) (by C-G formula based on SCr of 2.22 mg/dL (H)).    Allergies  Allergen Reactions  . Losartan Cough    Antimicrobials this admission: 12/4-12/8 >> unasyn 12/8-12/13 >> CTX  Dose adjustments this admission: None  Microbiology results: 12/11 BCx: ng x5 days 12/31 Sputum: pending  1/1 MRSA PCR: pending  Thank you for allowing pharmacy to be a part of this patient's care.  Phillis Haggis 12/16/2019 10:27 AM

## 2019-12-16 NOTE — Progress Notes (Signed)
[place] for heparin (Holding apixaban) Indication: atrial fibrillation  Allergies  Allergen Reactions  . Losartan Cough    Patient Measurements: Height: 5' 10.5" (179.1 cm) Weight: 158 lb 4.6 oz (71.8 kg) IBW/kg (Calculated) : 74.15  Vital Signs: Temp: 97.9 F (36.6 C) (01/01 0700) Temp Source: Axillary (01/01 0700) BP: 147/124 (01/01 0700) Pulse Rate: 76 (01/01 0700)  Labs: Recent Labs    12/14/19 0440 12/14/19 0440 12/15/19 0241 12/15/19 1105 12/15/19 1617 12/15/19 2009 12/16/19 0301  HGB 8.0*  --  7.7*  --   --   --  8.0*  HCT 26.0*  --  25.5*  --   --   --  27.5*  PLT 287  --  291  --   --   --  342  APTT  --    < > >200* 144*  --  93* 70*  HEPARINUNFRC  --   --  >2.20*  --   --   --  1.70*  CREATININE 4.66*  --  3.36*  --  2.64*  --  2.22*   < > = values in this interval not displayed.    Estimated Creatinine Clearance: 40 mL/min (A) (by C-G formula based on SCr of 2.22 mg/dL (H)).  Assessment: 54 yo m with afib on apixaban. Needed to restart CRRT this am, so plan was to leave on apixaban.  However, filter clotted twice when starting, so decision made to hold apixaban and convert to IV heparin  Last apixaban dose 1000 12/30  Was started on CRRT yesterday along with both circuit and IV heparin APTT this am was > 200, so dc circuit heparin and stick with IV  APTT still elevated but improved this afternoon - 144s  No issues with circuit or bleeding per RN  PM follow up - aPTT now within goal range.  Goal of Therapy:  Heparin level 0.3-0.7 units/ml  APTT 66 - 102 s Monitor platelets by anticoagulation protocol: Yes   Plan:  Continue IV heparin at current rate of 700 Recheck aPTT, HL with morning labs.  Nicoletta Dress, PharmD PGY2 Infectious Disease Pharmacy Resident   12/16/2019 7:12 AM    Please check AMION for all [place] numbers  12/16/2019 7:12 AM

## 2019-12-16 NOTE — Progress Notes (Addendum)
Shift note: Patient CBG's consistently dropping in absence of tube feedings. Order received to start patient on D10 at 13ml/hr. Patient with new complaint of left foot /heel pain. Very tender to palpation. Still with palpable pulses and warm to touch. Will continue to monitor.   Milford Cage, RN

## 2019-12-16 NOTE — Progress Notes (Signed)
Patient ID: [name] [name], male   DOB: 01/20/1966, 54 y.o.   MRN: [id number]  [facility] Progress Note   Assessment/ Plan:   1. Status post PEA and V. tach arrest: Cardiogenic shock resolved, remains on amiodarone and Eliquis. Not candidate for ICD with tracheostomy in place as well as previous concerns of adherence/drug use. 2. Pulm edema/ vol overload - was not responding to serial HD, now getting CRRT started 12/14/19. 5 L off net yest w/ CRRT, and 10kg down but CXR still wet and edema still on exam. Cont UF w/ CRRT.  3. ESRD - usual HD MWF, now CRRT as above 4. Anemia: Without overt blood loss, low hemoglobin/hematocrit noted with last Aranesp dose on 12/22. 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 6. Nutrition: Ongoing tube feeds at this time, restart renal multivitamin. 7. Bilateral lower extremity ischemia: seen by vascular surgery with brisk lower extremity Doppler signals noted; allowing demarcation to decide on need for surgical intervention.  8. Palliative care: appreciate PC's assistance   Kelly Splinter, MD 12/16/2019, 11:41 AM    Subjective:        5.3L net negative w/ CRRT yest   Objective:   BP (!) 103/56   Pulse 94   Temp 98.7 F (37.1 C) (Axillary)   Resp 20   Ht 5' 10.5" (1.791 m)   Wt 71.8 kg   SpO2 100%   BMI 22.39 kg/m   Physical Exam: Gen: tracheostomy in place, NGT in situ, temp cath for crrt running CVS: Pulse regular rhythm, normal rate, S1 and S2 normal Resp: bilat coarse BS  Abd: Soft, mildly distended, bowel sounds normal Ext: 1-2+ edema, bilateral leg/ankle wrapped in clean gauze.  Ischemic changes of digits noted L AVF+bruit   HD OP: [place] MWF  4h 21mn  74.5kg  2/2.5 bath  340/A1.5   P4  Hep 5000     C3 0.75 qTx     no ESA/Fe     Hb 12s usually    Medications:    . amiodarone  400 mg Per Tube Daily  . chlorhexidine gluconate (MEDLINE KIT)  15 mL Mouth Rinse BID  .  Chlorhexidine Gluconate Cloth  6 each Topical Q0600  . darbepoetin (ARANESP) injection - DIALYSIS  100 mcg Intravenous Q Wed-HD  . docusate  100 mg Per Tube Daily  . feeding supplement (PRO-STAT SUGAR FREE 64)  30 mL Per Tube 5 X Daily  . insulin aspart  0-9 Units Subcutaneous Q4H  . mouth rinse  15 mL Mouth Rinse 10 times per day  . midodrine  5 mg Per Tube Q8H  . multivitamin  1 tablet Oral QHS  . pantoprazole sodium  40 mg Per Tube QHS  . senna-docusate  1 tablet Per Tube BID  . sodium chloride flush  10-40 mL Intracatheter Q12H  . sodium chloride flush  10-40 mL Intracatheter Q12H  . white petrolatum   Topical Daily

## 2019-12-16 NOTE — Progress Notes (Signed)
NAME:  [name] [name], MRN:  [id number], DOB:  August 06, 1966, LOS: 93 ADMISSION DATE:  11/16/2019, CONSULTATION DATE:  12/2 REFERRING MD:  Dr. Sedonia Small, CHIEF COMPLAINT:  Post cardiac arrest    Brief History   54yo male presented post PEA arrest which occurred during iHD treatment as outpatient. This was a witnessed arrest with quick bystander CPR. Received 3 rounds of epi, 1g of calcium and 50 mEq of bicarb with ROSC after 15 minutes.   Emergent cricothyrotomy performed by EMS due to failed intubation with [facility] airway.   Past Medical History  NSVT 2017 Nonischemic cardiomyopathy Medical noncompliance Polysubstance abuse Hypertension End-stage renal disease Combined systolic and diastolic congestive heart failure Cocaine abuse Alcohol abuse Prolonged QTC  Significant Hospital Events   12/2 Admitted post cardiac arrest, emergent cric, 33 C TTM 12/8 V. tach arrest 12/10 Cric changed to tracheostomy 12/16 HD, tolerated only 30 minutes of trach collar 12/18 - He is critically ill, remains on vent. Soft blood pressure overnight but off pressors Afebrile. Rx HD 12/19 -  MAP > 65 with slow SBP. Doing PSV via trach. EP wants amio gtt cntinued through 12/21 and then decide on Dc. RN concerned about bilsters on feet and knees. She has called wound care. Not on sedation gtt. Following commands/. No active bleeding. Diarrhea + per RN 12/20: VVS recommends continued monitoring of LE and allowed necrosis to demarcate. Patient reportedly opposed to idea of amputaiton. Failed ATC and back on full vent. Amio gtt cotinues.  Afebrile.  Renam planning repeat HD v CRRT with pressor support 12/10/19 based on BP. Denies complaints other than LE pain. On TF 12/06/2019 complains of any pain with manipulation of lower extremities.  Currently on full mechanical ventilatory support.  No acute distress 12/23 did not tolerate wean well 12/24 Not tolerating wean, no events overnight 12/09/2019 -called by RN for  increased anxiety when attempted to come off vent on ATC. Pt with increased wob on atc, getting dialysis at this time. 12/27-12/29 Failed PS 12/30 - Remains volume overloaded. Place vas cath for CRRT  Consults:  Nephrology Electrophysiology ENT [place] 12/18   Procedures:  12/10-trach 12/4 RT fem HD cath >> out 12/2 RT [place] CVL >> 12/30 trialysis  Significant Diagnostic Tests:  CTA 12/2-segmental filling defects in the right middle lobe and right lower lobe pulmonary arteries, pneumomediastinum, subcu emphysema CT head 12/8-no acute abnormality  Echocardiogram 11/17/19-LVEF less than 20%, normal LV systolic function  Micro Data:  COVID 12/2 >> negative Aspirate 12/04 >> + for Haemophilus influenza  12/10 MRSA Nasal swab >> Positive 12/11 [place] >> negative 12/11 Respiratory CX >> Staph Epidermidis 12/31 resp culture >>  Antimicrobials:  Unasyn 12/4>>12/7 Ceftriazone 12/7>>12/13  Interim history/subjective:  CP and abdominal pain improved with NGT to LIWS yesterday and overnight. D10W overnight for hypoglycemia while TF off.  Objective   Blood pressure (!) 142/94, pulse 76, temperature 97.9 F (36.6 C), temperature source Axillary, resp. rate (!) 22, height 5' 10.5" (1.791 m), weight 71.8 kg, SpO2 100 %.    Vent Mode: PCV FiO2 (%):  [40 %-50 %] 50 % Set Rate:  [15 bmp-20 bmp] 20 bmp Vt Set:  [470 mL] 470 mL PEEP:  [5 cmH20] 5 cmH20 Plateau Pressure:  [20 cmH20-30 cmH20] 26 cmH20   Intake/Output Summary (Last 24 hours) at 12/16/2019 0920 Last data filed at 12/16/2019 0900 Gross per 24 hour  Intake 873.08 ml  Output 6204 ml  Net -5330.92 ml   [employer]  12/13/19 1615 12/15/19 0500 12/16/19 0500  Weight: 80.4 kg 76.6 kg 71.8 kg   Recent Labs    12/15/19 0241 12/16/19 0301  HGB 7.7* 8.0*   Physical Exam: General: chronically-ill appearing man laying in bed in NAD, watching TV HENT: [place]/AT Neck: trach in place, no erythema Eyes: eyes anicteric, EOMI Respiratory:  rhonchi bilaterally, mild tachypnea, breathing comfortably on the vent. Cardiovascular: tachycardic, regular rhythm, no murmurs GI: still distended and slightly tympanitic, but no longer TTP, + BS Extremities: persistent food edema with post- ischemic/ reperfusion skin changes Neuro: awake and alert, nodding to answer questions. Moving all extremities spontaneously Skin: no rashes, demarcation of feet from previous ischemia   Resolved Hospital Problem list    Cardiogenic shock   Assessment & Plan:   S/p PEA arrest on 12/2 S/p VT/VF cardiac arrest on 12/8 with ongoing NSVT Doubt PE but contrast timing poor, venous duplex negative, prolonged QT Plan -Con't PO amio; appreciate EP's recs -Not a candidate for ICD per EP. Per note from 12/29, [facility] would only be arranged AFTER discharge home/facility -con't telemetry monitoring -Palliative Care meeting with the family on 12/30- appreciate their input. The goal is for vent liberation, and they understand he is unlikely to return to his pre-morbid level of function  Acute on chronic respiratory failure s/p tracheostomy. Likely VAP- increasing WBC, purulent sputum, and increasing O2 requirements. Difficult intubation needing cric in field s/p exchange for tracheostomy  Remains difficult to wean off vent due to volume status (+7L CFB) in setting of ESRD and heart failure, deconditioning. Will require facility to continue vent weaning. Plan -con't to follow sputum cultures from 12/31 -starting broad-spectrum VAP antibiotics today -resume PS trials today -volume management per CRRT  Ileus vs constipation -NGT clamped to determine if he can tolerate trickle TF. If tolerating, can likely remove later today. -adding pericolace BID enterally -trickle TF today, hopefully can titrate back to goal tomorrow -pain control PRN  Combined systolic and diastolic CHF EF <10% Chronic atrial fibrillation Plan -amiodarone per EP -telemetry -heparin  infusion while on CRRT -CRRT for volume removal  RML and RLL subsegmental PE on 12/2 -heparin infusion  ESRD; has LUE fistula Plan -CRRT per nephrology; trialysis can be removed once back on HD.  -decrease midodrine to 5 mg TID   Hypoglycemia -con't D10W; will titrate off as TF tolerance improves  Ischemic changes of feet  -due to low flow, obtain vascular input, now off IV heparin. Seen by VVS 12/18 - VVS concerned that patient at risk for amputation of lower extremity and blisters are signaling ischemia. Recommending supportive care and await demarcation. Patient not interested in amputation Plan  -appreciate wound care & vascular's assistance -Continue anticoagulation  Anxiety Plan -PRN alprazolam  Hx polysustance abuse- cocaine, alcohol, tobacco Plan -counseling when appropriate  Chronic Anemia, multifactorial -chronic illness, critical illness, blood loss related to frequent lab draws Plan -con't to monitor -transfuse for Hb <7 -Aranesp per Nephrology  Goals of Care -Palliative Care has been very thoughtful with [place] discussions in setting of decompensated and end-stage chronic conditions and acute/recent critical illness. At present, family wishes for full scope of treatment. Appreciate Palliative Care continuing these discussions   Discussed with Social Work for placement options previously.    LABS   PULMONARY Recent Labs  Lab 12/13/19 0458  PHART 7.517*  PCO2ART 35.2  PO2ART 103.0  HCO3 28.6*  TCO2 30  O2SAT 99.0    CBC Recent Labs  Lab 12/14/19 0440 12/15/19 0241  12/16/19 0301  HGB 8.0* 7.7* 8.0*  HCT 26.0* 25.5* 27.5*  WBC 11.1* 12.4* 17.0*  PLT 287 291 342    COAGULATION No results for input(s): INR in the last 168 hours.  CARDIAC  No results for input(s): TROPONINI in the last 168 hours. No results for input(s): PROBNP in the last 168 hours.   CHEMISTRY Recent Labs  Lab 12/10/19 1103 12/12/19 0308 12/13/19 0339  12/13/19 0458 12/14/19 0440 12/15/19 0241 12/15/19 1617 12/16/19 0301  NA  --  133* 132* 130* 133* 134* 135 136  K  --  5.1 6.0* 5.1 4.7 5.0 5.3* 5.2*  CL  --  91* 94*  --  92* 96* 97* 97*  CO2  --  27 23  --  26 24 26 24   GLUCOSE  --  107* 94  --  96 101* 104* 79  BUN  --  76* 74*  --  57* 47* 37* 32*  CREATININE  --  5.66* 5.44*  --  4.66* 3.36* 2.64* 2.22*  CALCIUM  --  8.3* 8.6*  --  8.4* 8.3* 8.9 8.9  MG 2.0 2.1  --   --   --  2.3  --  2.6*  PHOS  --  3.1  3.2 3.7  --  3.5 3.0 2.9 2.8   Estimated Creatinine Clearance: 40 mL/min (A) (by C-G formula based on SCr of 2.22 mg/dL (H)).   LIVER Recent Labs  Lab 12/10/19 0402 12/13/19 0339 12/14/19 0440 12/15/19 0241 12/15/19 1617 12/16/19 0301  AST 14*  --   --   --   --   --   ALT 14  --   --   --   --   --   ALKPHOS 168*  --   --   --   --   --   BILITOT 0.7  --   --   --   --   --   PROT 6.7  --   --   --   --   --   ALBUMIN 2.3* 2.6* 2.3* 2.6* 3.0* 2.8*     INFECTIOUS No results for input(s): LATICACIDVEN, PROCALCITON in the last 168 hours.   ENDOCRINE CBG (last 3)  Recent Labs    12/16/19 0308 12/16/19 0347 12/16/19 0710  GLUCAP 55* 123* 70     This patient is critically ill with multiple organ system failure which requires frequent high complexity decision making, assessment, support, evaluation, and titration of therapies. This was completed through the application of advanced monitoring technologies and extensive interpretation of multiple databases. During this encounter critical care time was devoted to patient care services described in this note for 50 minutes.  Julian Hy, DO 12/16/19 9:20 AM [facility]

## 2019-12-16 NOTE — Progress Notes (Signed)
[place] Progress Note Patient Name: Dewight Catino DOB: 1966/12/08 MRN: [id number]   Date of Service  12/16/2019  HPI/Events of Note  Hypoglycemia - Enteral nutrition on hold d/t ileus. Blood glucose = 55.   eICU Interventions  Will order: 1. D10W to run IV at 20 mL/hour.      Intervention Category Major Interventions: Other:  Lysle Dingwall 12/16/2019, 3:18 AM

## 2019-12-16 NOTE — Progress Notes (Signed)
[place] Progress Note Patient Name: [name] [name] DOB: 1966/07/08 MRN: [id number]   Date of Service  12/16/2019  HPI/Events of Note  Hypoglycemia - Blood glucose = 57. Currently on D10W IV infusion at 20 mL/hour.   eICU Interventions  Will order: 1. D10W IV infusion to 50 mL/hour.      Intervention Category Major Interventions: Other:  Lysle Dingwall 12/16/2019, 10:05 PM

## 2019-12-17 ENCOUNTER — Inpatient Hospital Stay (HOSPITAL_COMMUNITY): Payer: Medicare Other

## 2019-12-17 LAB — COMPREHENSIVE METABOLIC PANEL
ALT: 13 U/L (ref 0–44)
AST: 16 U/L (ref 15–41)
Albumin: 2.5 g/dL — ABNORMAL LOW (ref 3.5–5.0)
Alkaline Phosphatase: 196 U/L — ABNORMAL HIGH (ref 38–126)
Anion gap: 10 (ref 5–15)
BUN: 30 mg/dL — ABNORMAL HIGH (ref 6–20)
CO2: 27 mmol/L (ref 22–32)
Calcium: 8.7 mg/dL — ABNORMAL LOW (ref 8.9–10.3)
Chloride: 97 mmol/L — ABNORMAL LOW (ref 98–111)
Creatinine, Ser: 1.89 mg/dL — ABNORMAL HIGH (ref 0.61–1.24)
GFR calc Af Amer: 47 mL/min — ABNORMAL LOW (ref >60)
GFR calc non Af Amer: 40 mL/min — ABNORMAL LOW (ref >60)
Glucose, Bld: 190 mg/dL — ABNORMAL HIGH (ref 70–99)
Potassium: 3.9 mmol/L (ref 3.5–5.1)
Sodium: 134 mmol/L — ABNORMAL LOW (ref 135–145)
Total Bilirubin: 1.1 mg/dL (ref 0.3–1.2)
Total Protein: 8.1 g/dL (ref 6.5–8.1)

## 2019-12-17 LAB — CULTURE, RESPIRATORY W GRAM STAIN

## 2019-12-17 LAB — RENAL FUNCTION PANEL
Albumin: 2.7 g/dL — ABNORMAL LOW (ref 3.5–5.0)
Albumin: 2.4 g/dL — ABNORMAL LOW (ref 3.5–5.0)
Anion gap: 14 (ref 5–15)
Anion gap: 13 (ref 5–15)
BUN: 28 mg/dL — ABNORMAL HIGH (ref 6–20)
BUN: 37 mg/dL — ABNORMAL HIGH (ref 6–20)
CO2: 25 mmol/L (ref 22–32)
CO2: 22 mmol/L (ref 22–32)
Calcium: 8.9 mg/dL (ref 8.9–10.3)
Calcium: 8.8 mg/dL — ABNORMAL LOW (ref 8.9–10.3)
Chloride: 96 mmol/L — ABNORMAL LOW (ref 98–111)
Chloride: 97 mmol/L — ABNORMAL LOW (ref 98–111)
Creatinine, Ser: 1.88 mg/dL — ABNORMAL HIGH (ref 0.61–1.24)
Creatinine, Ser: 2.28 mg/dL — ABNORMAL HIGH (ref 0.61–1.24)
GFR calc Af Amer: 47 mL/min — ABNORMAL LOW (ref >60)
GFR calc Af Amer: 37 mL/min — ABNORMAL LOW (ref >60)
GFR calc non Af Amer: 40 mL/min — ABNORMAL LOW (ref >60)
GFR calc non Af Amer: 32 mL/min — ABNORMAL LOW (ref >60)
Glucose, Bld: 120 mg/dL — ABNORMAL HIGH (ref 70–99)
Glucose, Bld: 128 mg/dL — ABNORMAL HIGH (ref 70–99)
Phosphorus: 2.2 mg/dL — ABNORMAL LOW (ref 2.5–4.6)
Phosphorus: 3 mg/dL (ref 2.5–4.6)
Potassium: 4.3 mmol/L (ref 3.5–5.1)
Potassium: 4.4 mmol/L (ref 3.5–5.1)
Sodium: 135 mmol/L (ref 135–145)
Sodium: 132 mmol/L — ABNORMAL LOW (ref 135–145)

## 2019-12-17 LAB — GLUCOSE, CAPILLARY
Glucose-Capillary: 104 mg/dL — ABNORMAL HIGH (ref 70–99)
Glucose-Capillary: 114 mg/dL — ABNORMAL HIGH (ref 70–99)
Glucose-Capillary: 130 mg/dL — ABNORMAL HIGH (ref 70–99)
Glucose-Capillary: 138 mg/dL — ABNORMAL HIGH (ref 70–99)
Glucose-Capillary: 140 mg/dL — ABNORMAL HIGH (ref 70–99)
Glucose-Capillary: 173 mg/dL — ABNORMAL HIGH (ref 70–99)
Glucose-Capillary: 67 mg/dL — ABNORMAL LOW (ref 70–99)
Glucose-Capillary: 70 mg/dL (ref 70–99)
Glucose-Capillary: 79 mg/dL (ref 70–99)
Glucose-Capillary: 80 mg/dL (ref 70–99)
Glucose-Capillary: 96 mg/dL (ref 70–99)
Glucose-Capillary: 98 mg/dL (ref 70–99)

## 2019-12-17 LAB — CBC WITH DIFFERENTIAL/PLATELET
Abs Immature Granulocytes: 0.22 10*3/uL — ABNORMAL HIGH (ref 0.00–0.07)
Basophils Absolute: 0.1 10*3/uL (ref 0.0–0.1)
Basophils Relative: 1 %
Eosinophils Absolute: 0.5 10*3/uL (ref 0.0–0.5)
Eosinophils Relative: 3 %
HCT: 24.5 % — ABNORMAL LOW (ref 39.0–52.0)
Hemoglobin: 7.3 g/dL — ABNORMAL LOW (ref 13.0–17.0)
Immature Granulocytes: 2 %
Lymphocytes Relative: 8 %
Lymphs Abs: 1.1 10*3/uL (ref 0.7–4.0)
MCH: 27.7 pg (ref 26.0–34.0)
MCHC: 29.8 g/dL — ABNORMAL LOW (ref 30.0–36.0)
MCV: 92.8 fL (ref 80.0–100.0)
Monocytes Absolute: 1.7 10*3/uL — ABNORMAL HIGH (ref 0.1–1.0)
Monocytes Relative: 12 %
Neutro Abs: 10.6 10*3/uL — ABNORMAL HIGH (ref 1.7–7.7)
Neutrophils Relative %: 74 %
Platelets: 343 10*3/uL (ref 150–400)
RBC: 2.64 MIL/uL — ABNORMAL LOW (ref 4.22–5.81)
RDW: 17.5 % — ABNORMAL HIGH (ref 11.5–15.5)
WBC: 14.3 10*3/uL — ABNORMAL HIGH (ref 4.0–10.5)
nRBC: 0.6 % — ABNORMAL HIGH (ref 0.0–0.2)

## 2019-12-17 LAB — APTT
aPTT: 58 seconds — ABNORMAL HIGH (ref 24–36)
aPTT: 50 seconds — ABNORMAL HIGH (ref 24–36)

## 2019-12-17 LAB — MRSA PCR SCREENING: MRSA by PCR: NEGATIVE

## 2019-12-17 LAB — HEPARIN LEVEL (UNFRACTIONATED)
Heparin Unfractionated: 1.1 IU/mL — ABNORMAL HIGH (ref 0.30–0.70)
Heparin Unfractionated: 0.71 IU/mL — ABNORMAL HIGH (ref 0.30–0.70)

## 2019-12-17 LAB — MAGNESIUM: Magnesium: 2.5 mg/dL — ABNORMAL HIGH (ref 1.7–2.4)

## 2019-12-17 MED ORDER — SODIUM CHLORIDE 0.9% IV SOLUTION: Freq: Once | INTRAVENOUS | Status: DC

## 2019-12-17 MED ORDER — CHLORHEXIDINE GLUCONATE CLOTH 2 % EX PADS
6.0000 | MEDICATED_PAD | Freq: Every day | CUTANEOUS | Status: DC
  Administered 2019-12-17 – 2019-12-18 (×2): 6 via TOPICAL

## 2019-12-17 MED ORDER — VITAL 1.5 CAL PO LIQD
1000.0000 mL | ORAL | Status: DC
  Administered 2019-12-17 – 2019-12-23 (×5): 1000 mL
  Filled 2019-12-17 (×10): qty 1000

## 2019-12-17 MED ORDER — NOREPINEPHRINE 4 MG/250ML-% IV SOLN
0.0000 ug/min | INTRAVENOUS | Status: DC
  Administered 2019-12-17 – 2019-12-18 (×2): 2 ug/min via INTRAVENOUS
  Filled 2019-12-17 (×2): qty 250

## 2019-12-17 MED ORDER — HEPARIN BOLUS VIA INFUSION
1000.0000 [IU] | Freq: Once | INTRAVENOUS | Status: AC
  Administered 2019-12-17: 1000 [IU] via INTRAVENOUS
  Filled 2019-12-17: qty 1000

## 2019-12-17 MED ORDER — DEXTROSE 50 % IV SOLN
INTRAVENOUS | Status: AC
  Administered 2019-12-17: 25 mL
  Filled 2019-12-17: qty 50

## 2019-12-17 MED ORDER — ALBUMIN HUMAN 5 % IV SOLN
25.0000 g | Freq: Once | INTRAVENOUS | Status: AC
  Administered 2019-12-17: 16:00:00 25 g via INTRAVENOUS
  Filled 2019-12-17: qty 500

## 2019-12-17 NOTE — Progress Notes (Signed)
NAME:  [name] [name], MRN:  [id number], DOB:  1966-08-28, LOS: 66 ADMISSION DATE:  11/16/2019, CONSULTATION DATE:  12/2 REFERRING MD:  Dr. Sedonia Small, CHIEF COMPLAINT:  Post cardiac arrest    Brief History   54yo male presented post PEA arrest which occurred during iHD treatment as outpatient. This was a witnessed arrest with quick bystander CPR. Received 3 rounds of epi, 1g of calcium and 50 mEq of bicarb with ROSC after 15 minutes.   Emergent cricothyrotomy performed by EMS due to failed intubation with [facility] airway.   Past Medical History  NSVT 2017 Nonischemic cardiomyopathy Medical noncompliance Polysubstance abuse Hypertension End-stage renal disease Combined systolic and diastolic congestive heart failure Cocaine abuse Alcohol abuse Prolonged QTC  Significant Hospital Events   12/2 Admitted post cardiac arrest, emergent cric, 33 C TTM 12/8 V. tach arrest 12/10 Cric changed to tracheostomy 12/16 HD, tolerated only 30 minutes of trach collar 12/18 - He is critically ill, remains on vent. Soft blood pressure overnight but off pressors Afebrile. Rx HD 12/19 -  MAP > 65 with slow SBP. Doing PSV via trach. EP wants amio gtt cntinued through 12/21 and then decide on Dc. RN concerned about bilsters on feet and knees. She has called wound care. Not on sedation gtt. Following commands/. No active bleeding. Diarrhea + per RN 12/20: VVS recommends continued monitoring of LE and allowed necrosis to demarcate. Patient reportedly opposed to idea of amputaiton. Failed ATC and back on full vent. Amio gtt cotinues.  Afebrile.  Renam planning repeat HD v CRRT with pressor support 12/10/19 based on BP. Denies complaints other than LE pain. On TF 12/06/2019 complains of any pain with manipulation of lower extremities.  Currently on full mechanical ventilatory support.  No acute distress 12/23 did not tolerate wean well 12/24 Not tolerating wean, no events overnight 12/09/2019 -called by RN for  increased anxiety when attempted to come off vent on ATC. Pt with increased wob on atc, getting dialysis at this time. 12/27-12/29 Failed PS 12/30 - Remains volume overloaded. Place vas cath for CRRT  1/2: CRRT stopped after filter clotted.  Borderline blood pressure, appears euvolemic.  More hypotensive today. -3.6 L,  (net neg since 12/29, -6.4 L total for hosp)    Consults:  Nephrology Electrophysiology ENT [place] 12/18   Procedures:  12/10-trach 12/4 RT fem HD cath >> out 12/2 RT [place] CVL >> 12/30 trialysis  Significant Diagnostic Tests:  CTA 12/2-segmental filling defects in the right middle lobe and right lower lobe pulmonary arteries, pneumomediastinum, subcu emphysema CT head 12/8-no acute abnormality  Echocardiogram 11/17/19-LVEF less than 20%, normal LV systolic function  Micro Data:  COVID 12/2 >> negative Aspirate 12/04 >> + for Haemophilus influenza  12/10 MRSA Nasal swab >> Positive 12/11 [place] >> negative 12/11 Respiratory CX >> Staph Epidermidis 12/31 resp culture >>  Antimicrobials:  Unasyn 12/4>>12/7 Ceftriazone 12/7>>12/13  Interim history/subjective:  CP and abdominal pain improved with NGT to LIWS yesterday and overnight. D10W overnight for hypoglycemia while TF off.  Objective   Blood pressure (!) 85/60, pulse (!) 103, temperature (!) 97.5 F (36.4 C), temperature source Oral, resp. rate 15, height 5' 10.5" (1.791 m), weight 72 kg, SpO2 (!) 54 %.    Vent Mode: PSV;CPAP FiO2 (%):  [40 %] 40 % Set Rate:  [20 bmp] 20 bmp Vt Set:  [470 mL] 470 mL PEEP:  [5 cmH20] 5 cmH20 Pressure Support:  [8 DJS97-02 cmH20] 8 cmH20 Plateau Pressure:  [18 cmH20-21 cmH20] 18  cmH20   Intake/Output Summary (Last 24 hours) at 12/17/2019 1244 Last data filed at 12/17/2019 1100 Gross per 24 hour  Intake 2228.34 ml  Output 5344 ml  Net -3115.66 ml   [employer] Weights   12/15/19 0500 12/16/19 0500 12/17/19 0356  Weight: 76.6 kg 71.8 kg 72 kg   Recent Labs    12/15/19 0241  12/16/19 0301  HGB 7.7* 8.0*   Physical Exam: General: chronically-ill appearing man laying in bed in NAD, nods yes to question about pain HENT: [place]/AT Neck: trach in place, no erythema, mucous from trach site Eyes: eyes anicteric, EOMI Respiratory: rhonchi bilaterally,breathing comfortably on the vent. Cardiovascular: tachycardic, regular rhythm, no murmurs GI: mild distention, no tenderness,  Extremities: persistent food edema with post- ischemic/ reperfusion skin changes Neuro: awake and alert, nodding to answer questions. Moving all extremities spontaneously Skin: no rashes, demarcation of feet from previous ischemia, dp pulses faint but palpable.    Resolved Hospital Problem list    Cardiogenic shock   Assessment & Plan:   S/p PEA arrest on 12/2 S/p VT/VF cardiac arrest on 12/8 with ongoing NSVT Doubt PE but contrast timing poor, venous duplex negative, prolonged QT Cause unclear - underlying CHF?  Plan -Cont PO amio; appreciate EP's recs -Not a candidate for ICD per EP on 12/29 ("Not candidate for ICD at this time with new tracheostomy, risk of infection, risk of mortality"). Per note from 12/29, [facility] would only be arranged AFTER discharge home/facility -con't telemetry monitoring May need to have them reassess for ICD if becomes more stable, weans from vent.  -Palliative Care meeting with the family on 12/30- appreciate their input. The goal is for vent liberation, and they understand he is unlikely to return to his pre-morbid level of function  Hypotension  on midodrine started 1/1.   Acute on chronic respiratory failure s/p tracheostomy. Likely VAP- increasing WBC, purulent sputum, and increasing O2 requirements. Difficult intubation needing cric in field s/p exchange for tracheostomy  Actually is tolerating PSV trial for long time today, still having copious sputum output from trach.   Sputum growing staph aureus.  Plan -cont zosyn and vanc today,  narrow  tomorrow if no further growth from cultures. More hypotensive today, may be more septic.  -tolerating PS all day today.  -volume management per CRRT  Ileus vs constipation -NGT has been clamped, increasing feeds and remove NG today.  -adding pericolace BID enterally -pain control PRN  Combined systolic and diastolic CHF EF <24% Chronic atrial fibrillation Plan -amiodarone per EP -telemetry -heparin infusion  CRRT stopping today, -6.4 L neg for hospitalization and slightly hypotensive.    RML and RLL subsegmental PE on 12/2 -heparin infusion  ESRD; has LUE fistula Plan -CRRT per nephrology; trialysis can be removed once back on HD.  -cont midodrine to 5 mg TID   Hypoglycemia -con't D10W; will titrate off as TF tolerance improves. Increasing tube feeds today as tolerates.   Ischemic changes of feet  -due to low flow, obtain vascular input, now on IV heparin. Seen by VVS 12/18 - VVS concerned that patient at risk for amputation of lower extremity and blisters are signaling ischemia. Recommending supportive care and await demarcation. Patient not interested in amputation Plan  -appreciate wound care & vascular's assistance -Continue anticoagulation on heparin  Anxiety Plan -PRN alprazolam  Hx polysustance abuse- cocaine, alcohol, tobacco Plan -counseling when appropriate  Chronic Anemia, multifactorial -chronic illness, critical illness, blood loss related to frequent lab draws Plan -con't to monitor -  transfuse for Hb <7 -Aranesp per Nephrology  Hypoalbuminemia 2.7  Goals of Care -Palliative Care has been very thoughtful with [place] discussions in setting of decompensated and end-stage chronic conditions and acute/recent critical illness. At present, family wishes for full scope of treatment. Appreciate Palliative Care continuing these discussions   Discussed with Social Work for placement options previously.    LABS   PULMONARY Recent Labs  Lab 12/13/19 0458   PHART 7.517*  PCO2ART 35.2  PO2ART 103.0  HCO3 28.6*  TCO2 30  O2SAT 99.0    CBC Recent Labs  Lab 12/14/19 0440 12/15/19 0241 12/16/19 0301  HGB 8.0* 7.7* 8.0*  HCT 26.0* 25.5* 27.5*  WBC 11.1* 12.4* 17.0*  PLT 287 291 342    COAGULATION No results for input(s): INR in the last 168 hours.  CARDIAC  No results for input(s): TROPONINI in the last 168 hours. No results for input(s): PROBNP in the last 168 hours.   CHEMISTRY Recent Labs  Lab 12/12/19 0308 12/15/19 0241 12/15/19 1617 12/16/19 0301 12/16/19 1643 12/17/19 0017 12/17/19 0506  NA 133* 134* 135 136 135 134* 135  K 5.1 5.0 5.3* 5.2* 4.6 3.9 4.3  CL 91* 96* 97* 97* 97* 97* 96*  CO2 27 24 26 24 23 27 25   GLUCOSE 107* 101* 104* 79 98 190* 120*  BUN 76* 47* 37* 32* 35* 30* 28*  CREATININE 5.66* 3.36* 2.64* 2.22* 2.29* 1.89* 1.88*  CALCIUM 8.3* 8.3* 8.9 8.9 8.7* 8.7* 8.9  MG 2.1 2.3  --  2.6*  --   --  2.5*  PHOS 3.1  3.2 3.0 2.9 2.8 2.6  --  2.2*   Estimated Creatinine Clearance: 47.3 mL/min (A) (by C-G formula based on SCr of 1.88 mg/dL (H)).   LIVER Recent Labs  Lab 12/15/19 1617 12/16/19 0301 12/16/19 1643 12/17/19 0017 12/17/19 0506  AST  --   --   --  16  --   ALT  --   --   --  13  --   ALKPHOS  --   --   --  196*  --   BILITOT  --   --   --  1.1  --   PROT  --   --   --  8.1  --   ALBUMIN 3.0* 2.8* 2.6* 2.5* 2.7*     INFECTIOUS No results for input(s): LATICACIDVEN, PROCALCITON in the last 168 hours.   ENDOCRINE CBG (last 3)  Recent Labs    12/17/19 0716 12/17/19 0808 12/17/19 1109  GLUCAP 67* 140* 79     This patient is critically ill with multiple organ system failure which requires frequent high complexity decision making, assessment, support, evaluation, and titration of therapies. This was completed through the application of advanced monitoring technologies and extensive interpretation of multiple databases. During this encounter critical care time was devoted to  patient care services described in this note for 45 minutes.  Collier Bullock, MD 12/17/19 12:44 PM [facility]

## 2019-12-17 NOTE — Progress Notes (Signed)
[place] for heparin (Holding apixaban) Indication: atrial fibrillation  Allergies  Allergen Reactions  . Losartan Cough    Patient Measurements: Height: 5' 10.5" (179.1 cm) Weight: 158 lb 11.7 oz (72 kg) IBW/kg (Calculated) : 74.15  Vital Signs: Temp: 98.8 F (37.1 C) (01/02 1500) Temp Source: Axillary (01/02 1500) BP: 107/50 (01/02 1630) Pulse Rate: 98 (01/02 1630)  Labs: Recent Labs    12/15/19 0241 12/15/19 0506 12/16/19 0301 12/17/19 0017 12/17/19 0506 12/17/19 1504  HGB 7.7*  --  8.0*  --   --  7.3*  HCT 25.5*  --  27.5*  --   --  24.5*  PLT 291  --  342  --   --  343  APTT >200*  --  70*  --  58* 50*  HEPARINUNFRC >2.20*  --  1.70*  --  1.10* 0.71*  CREATININE 3.36*   < > 2.22* 1.89* 1.88* 2.28*   < > = values in this interval not displayed.    Estimated Creatinine Clearance: 39 mL/min (A) (by C-G formula based on SCr of 2.28 mg/dL (H)).  Assessment: 54 yo m with afib on apixaban PTA but transitioned to IV heparin. Heparin level remains elevated from effects of apixaban but aPTT if below goal. No bleeding noted.   Goal of Therapy:  Heparin level 0.3-0.7 units/ml  APTT 66 - 102 s Monitor platelets by anticoagulation protocol: Yes   Plan:  Increase heparin gtt to 1100 units/hr Check an 8 hr aPTT Daily aPTT, heparin level and CBC  Salome Arnt, PharmD, BCPS Clinical Pharmacist Please see AMION for all pharmacy numbers 12/17/2019 5:10 PM

## 2019-12-17 NOTE — Progress Notes (Signed)
Dr. Duwayne Heck spoke with Dr. Jonnie Finner and per Dr. Jonnie Finner, we can use the HD catheter for medication administration.

## 2019-12-17 NOTE — Progress Notes (Signed)
Patient ID: [name] [name], male   DOB: 07-13-1966, 54 y.o.   MRN: [id number]  [facility] Progress Note   Assessment/ Plan:   1. Status post PEA and V. tach arrest: Cardiogenic shock resolved, remains on amiodarone and Eliquis. Not candidate for ICD with tracheostomy in place as well as previous concerns of adherence/drug use. 2. Pulm edema/ vol overload - was not responding to serial HD, now getting CRRT started 12/14/19 and has pulled 12L off and wt's are down 10-11 kg however CXR not really much better as I had hoped it would be w/ vol control. BP's dropping now and UF cut back to 100cc/hr, feel we are reaching euvolemia. Will cont CRRT another 24 hrs then resume iHD. Poor prognosis seems still to be the case.  3. ESRD - usual HD MWF, now CRRT as above 4. Anemia: Without overt blood loss, low hemoglobin/hematocrit noted with last Aranesp dose on 12/22. 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 6. Nutrition: Ongoing tube feeds at this time, restart renal multivitamin. 7. Bilateral lower extremity ischemia: seen by vascular surgery with brisk lower extremity Doppler signals noted; allowing demarcation to decide on need for surgical intervention.  8. Palliative care: appreciate PC's assistance   Kelly Splinter, MD 12/17/2019, 9:39 AM    Subjective:        5.3L net negative w/ CRRT yest   Objective:   BP 95/62   Pulse (!) 101   Temp (!) 97.5 F (36.4 C) (Oral)   Resp 15   Ht 5' 10.5" (1.791 m)   Wt 72 kg   SpO2 100%   BMI 22.45 kg/m   Physical Exam: Gen: tracheostomy in place, NGT in situ, temp cath for crrt running CVS: Pulse regular rhythm, normal rate, S1 and S2 normal Resp: bilat coarse BS  Abd: Soft, mildly distended, bowel sounds normal Ext: LE edema resolved,  Ischemic changes of legs noted L AVF+bruit   HD OP: [facility] MWF  4h 21mn  74.5kg  2/2.5 bath  340/A1.5   P4  Hep 5000     C3 0.75 qTx     no ESA/Fe  Hb 12s usually    Medications:    . amiodarone  400 mg Per Tube Daily  . chlorhexidine gluconate (MEDLINE KIT)  15 mL Mouth Rinse BID  . Chlorhexidine Gluconate Cloth  6 each Topical Q0600  . darbepoetin (ARANESP) injection - DIALYSIS  100 mcg Intravenous Q Wed-HD  . docusate  100 mg Per Tube Daily  . feeding supplement (PRO-STAT SUGAR FREE 64)  30 mL Per Tube 5 X Daily  . insulin aspart  0-9 Units Subcutaneous Q4H  . mouth rinse  15 mL Mouth Rinse 10 times per day  . midodrine  5 mg Per Tube Q8H  . multivitamin  1 tablet Oral QHS  . pantoprazole sodium  40 mg Per Tube QHS  . senna-docusate  1 tablet Per Tube BID  . sodium chloride flush  10-40 mL Intracatheter Q12H  . sodium chloride flush  10-40 mL Intracatheter Q12H  . white petrolatum   Topical Daily

## 2019-12-17 NOTE — Progress Notes (Signed)
Per Dr. Duwayne Heck, RN can start increasing tube feeds to patients previous goal of 45cc/hour. Once blood sugars maintain consistently in normal limits, RN can stop Dextrose infusion. If patient tolerates tube feeds at goal, RN can remove NG tube. Will continue to monitor.

## 2019-12-17 NOTE — Progress Notes (Signed)
Per front of chart, Father Phelan Schadt and Son Lowen Barringer are both patients designated visitors. It is agreed with Father that only one person is to come per day. Which days will be determined by son/father. Regular visitation policy applies. Visiting hours are 10am to 8pm. The visitor can stay as long as they want during those hours, but once they leave [facility], their visit is done for that day.  This RN spoke with Surveyor, quantity Maudie Mercury, and these above exceptions will be allowed.

## 2019-12-17 NOTE — Progress Notes (Signed)
[place] for heparin (Holding apixaban) Indication: atrial fibrillation  Allergies  Allergen Reactions  . Losartan Cough    Patient Measurements: Height: 5' 10.5" (179.1 cm) Weight: 158 lb 11.7 oz (72 kg) IBW/kg (Calculated) : 74.15  Vital Signs: Temp: 98 F (36.7 C) (01/02 0400) Temp Source: Oral (01/02 0400) BP: 112/81 (01/02 0700) Pulse Rate: 104 (01/02 0515)  Labs: Recent Labs    12/15/19 0241 12/15/19 0506 12/15/19 2009 12/16/19 0301 12/16/19 1643 12/17/19 0017 12/17/19 0506  HGB 7.7*  --   --  8.0*  --   --   --   HCT 25.5*  --   --  27.5*  --   --   --   PLT 291  --   --  342  --   --   --   APTT >200*  --  93* 70*  --   --  58*  HEPARINUNFRC >2.20*  --   --  1.70*  --   --  1.10*  CREATININE 3.36*   < >  --  2.22* 2.29* 1.89* 1.88*   < > = values in this interval not displayed.    Estimated Creatinine Clearance: 47.3 mL/min (A) (by C-G formula based on SCr of 1.88 mg/dL (H)).  Assessment: 54 yo m with afib on apixaban. Needed to restart CRRT this am, so plan was to leave on apixaban.  However, filter clotted twice when starting, so decision made to hold apixaban and convert to IV heparin  Last apixaban dose 1000 12/30  Was started on CRRT yesterday along with both circuit and IV heparin APTT this am was > 200, so dc circuit heparin and stick with IV  No issues with circuit or bleeding per RN  HL this morning continues to normalize but still falsely elevated at 1.1, will continue to dose off aPTT for now. APTT this AM slightly below goal range at 58, will bolus and increase rate this AM.   Goal of Therapy:  Heparin level 0.3-0.7 units/ml  APTT 66 - 102 s Monitor platelets by anticoagulation protocol: Yes   Plan:  Bolus 1000 units x1 Increase IV heparin to 850 units/hr 8 hour aPTT Recheck aPTT, HL with morning labs.  Nicoletta Dress, PharmD PGY2 Infectious Disease Pharmacy Resident   12/17/2019 7:10  AM    Please check AMION for all [place] numbers  12/17/2019 7:10 AM

## 2019-12-17 NOTE — Progress Notes (Signed)
Hypoglycemic Event  CBG: 67  Treatment: D50 50 mL (25 gm)  Symptoms: None  Follow-up CBG: LTRV:2023 CBG Result:140  Possible Reasons for Event: Inadequate meal intake  Comments/MD notified:Dr. Patsey Berthold notified    Netta Corrigan

## 2019-12-17 NOTE — Progress Notes (Signed)
Per Dr. Jonnie Finner and Dr. Patsey Berthold, RN can stop CRRT when filter clots. RN can pull fluid as blood pressure tolerates until then.

## 2019-12-17 NOTE — Plan of Care (Signed)
  Problem: Elimination: Goal: Will not experience complications related to bowel motility Outcome: Progressing Note: Bowel movement 12/16/2019    Problem: Activity: Goal: Risk for activity intolerance will decrease Outcome: Not Progressing Note: Pt currently bedbound    Problem: Nutrition: Goal: Adequate nutrition will be maintained Outcome: Not Progressing Note: Pt currently on trickle feed at 10cc/hour. Will discuss with healthcare team if we can increase rate.

## 2019-12-18 ENCOUNTER — Inpatient Hospital Stay (HOSPITAL_COMMUNITY): Payer: Medicare Other

## 2019-12-18 LAB — CBC
HCT: 26.2 % — ABNORMAL LOW (ref 39.0–52.0)
Hemoglobin: 8.1 g/dL — ABNORMAL LOW (ref 13.0–17.0)
MCH: 28.6 pg (ref 26.0–34.0)
MCHC: 30.9 g/dL (ref 30.0–36.0)
MCV: 92.6 fL (ref 80.0–100.0)
Platelets: 353 10*3/uL (ref 150–400)
RBC: 2.83 MIL/uL — ABNORMAL LOW (ref 4.22–5.81)
RDW: 17 % — ABNORMAL HIGH (ref 11.5–15.5)
WBC: 16 10*3/uL — ABNORMAL HIGH (ref 4.0–10.5)
nRBC: 0.2 % (ref 0.0–0.2)

## 2019-12-18 LAB — TYPE AND SCREEN
ABO/RH(D): A POS
Antibody Screen: NEGATIVE
Unit division: 0

## 2019-12-18 LAB — BPAM RBC
Blood Product Expiration Date: 202101262359
ISSUE DATE / TIME: 202101022117
Unit Type and Rh: 6200

## 2019-12-18 LAB — RENAL FUNCTION PANEL
Albumin: 2.5 g/dL — ABNORMAL LOW (ref 3.5–5.0)
Anion gap: 13 (ref 5–15)
BUN: 56 mg/dL — ABNORMAL HIGH (ref 6–20)
CO2: 23 mmol/L (ref 22–32)
Calcium: 8.7 mg/dL — ABNORMAL LOW (ref 8.9–10.3)
Chloride: 94 mmol/L — ABNORMAL LOW (ref 98–111)
Creatinine, Ser: 3.25 mg/dL — ABNORMAL HIGH (ref 0.61–1.24)
GFR calc Af Amer: 24 mL/min — ABNORMAL LOW (ref >60)
GFR calc non Af Amer: 21 mL/min — ABNORMAL LOW (ref >60)
Glucose, Bld: 123 mg/dL — ABNORMAL HIGH (ref 70–99)
Phosphorus: 3.9 mg/dL (ref 2.5–4.6)
Potassium: 4.3 mmol/L (ref 3.5–5.1)
Sodium: 130 mmol/L — ABNORMAL LOW (ref 135–145)

## 2019-12-18 LAB — MAGNESIUM: Magnesium: 2.7 mg/dL — ABNORMAL HIGH (ref 1.7–2.4)

## 2019-12-18 LAB — GLUCOSE, CAPILLARY
Glucose-Capillary: 100 mg/dL — ABNORMAL HIGH (ref 70–99)
Glucose-Capillary: 113 mg/dL — ABNORMAL HIGH (ref 70–99)
Glucose-Capillary: 132 mg/dL — ABNORMAL HIGH (ref 70–99)
Glucose-Capillary: 98 mg/dL (ref 70–99)
Glucose-Capillary: 98 mg/dL (ref 70–99)
Glucose-Capillary: 99 mg/dL (ref 70–99)

## 2019-12-18 LAB — PREPARE RBC (CROSSMATCH)

## 2019-12-18 LAB — HEMOGLOBIN AND HEMATOCRIT, BLOOD
HCT: 25.5 % — ABNORMAL LOW (ref 39.0–52.0)
Hemoglobin: 7.8 g/dL — ABNORMAL LOW (ref 13.0–17.0)

## 2019-12-18 LAB — APTT
aPTT: 86 seconds — ABNORMAL HIGH (ref 24–36)
aPTT: 77 seconds — ABNORMAL HIGH (ref 24–36)

## 2019-12-18 LAB — HEPARIN LEVEL (UNFRACTIONATED): Heparin Unfractionated: 0.48 IU/mL (ref 0.30–0.70)

## 2019-12-18 MED ORDER — CHLORHEXIDINE GLUCONATE CLOTH 2 % EX PADS: 6.0000 | MEDICATED_PAD | Freq: Every day | CUTANEOUS | Status: DC

## 2019-12-18 MED ORDER — CEFAZOLIN SODIUM-DEXTROSE 1-4 GM/50ML-% IV SOLN
1.0000 g | INTRAVENOUS | Status: AC
  Administered 2019-12-18 – 2019-12-22 (×5): 1 g via INTRAVENOUS
  Filled 2019-12-18 (×5): qty 50

## 2019-12-18 NOTE — Progress Notes (Signed)
[place] for Heparin (Holding apixaban) Indication: atrial fibrillation  Allergies  Allergen Reactions  . Losartan Cough    Patient Measurements: Height: 5' 10.5" (179.1 cm) Weight: 159 lb 2.8 oz (72.2 kg) IBW/kg (Calculated) : 74.15  Vital Signs: Temp: 98.4 F (36.9 C) (01/03 0715) Temp Source: Oral (01/03 0715) BP: 101/47 (01/03 0838) Pulse Rate: 95 (01/03 0838)  Labs: Recent Labs    12/16/19 0301 12/16/19 0301 12/17/19 0506 12/17/19 1504 12/18/19 0030 12/18/19 0031 12/18/19 0458 12/18/19 1004  HGB 8.0*  --   --  7.3*  --  7.8* 8.1*  --   HCT 27.5*  --   --  24.5*  --  25.5* 26.2*  --   PLT 342  --   --  343  --   --  353  --   APTT 70*   < > 58* 50* 86*  --   --  77*  HEPARINUNFRC 1.70*  --  1.10* 0.71*  --   --   --  0.48  CREATININE 2.22*  --  1.88* 2.28*  --   --  3.25*  --    < > = values in this interval not displayed.    Estimated Creatinine Clearance: 27.5 mL/min (A) (by C-G formula based on SCr of 3.25 mg/dL (H)).  Assessment: 54 yo m with afib on apixaban PTA but transitioned to IV heparin. No bleeding noted. H/H plts are stable  Heparin level 0.48, aPTT 77 both WNL for second time while on 1100 units/hr. Will dose solely on HL from now on  Goal of Therapy:  Heparin level 0.3-0.7 units/ml  APTT 66 - 102 s Monitor platelets by anticoagulation protocol: Yes   Plan:  Cont heparin drip at 1100 units/hr Daily CBC/HL  Nicoletta Dress, PharmD PGY2 Infectious Disease Pharmacy Resident

## 2019-12-18 NOTE — Progress Notes (Signed)
[place] for Heparin (Holding apixaban) Indication: atrial fibrillation  Allergies  Allergen Reactions  . Losartan Cough    Patient Measurements: Height: 5' 10.5" (179.1 cm) Weight: 158 lb 11.7 oz (72 kg) IBW/kg (Calculated) : 74.15  Vital Signs: Temp: 98.9 F (37.2 C) (01/02 2355) Temp Source: Oral (01/02 2355) BP: 104/80 (01/03 0100) Pulse Rate: 96 (01/03 0100)  Labs: Recent Labs    12/15/19 0241 12/15/19 0506 12/16/19 0301 12/17/19 0017 12/17/19 0506 12/17/19 1504 12/18/19 0030 12/18/19 0031  HGB 7.7*  --  8.0*  --   --  7.3*  --  7.8*  HCT 25.5*  --  27.5*  --   --  24.5*  --  25.5*  PLT 291  --  342  --   --  343  --   --   APTT >200*  --  70*  --  58* 50* 86*  --   HEPARINUNFRC >2.20*  --  1.70*  --  1.10* 0.71*  --   --   CREATININE 3.36*   < > 2.22* 1.89* 1.88* 2.28*  --   --    < > = values in this interval not displayed.    Estimated Creatinine Clearance: 39 mL/min (A) (by C-G formula based on SCr of 2.28 mg/dL (H)).  Assessment: 54 yo m with afib on apixaban PTA but transitioned to IV heparin. Heparin level remains elevated from effects of apixaban but aPTT if below goal. No bleeding noted.   1/3 AM update:  APTT therapeutic x 1 after rate increase Hgb low but stable  Goal of Therapy:  Heparin level 0.3-0.7 units/ml  APTT 66 - 102 s Monitor platelets by anticoagulation protocol: Yes   Plan:  Cont heparin drip at 1100 units/hr Confirmatory aPTT/HL at 1000 Daily CBC/HL/aPTT  Narda Bonds, PharmD, BCPS Clinical Pharmacist Phone: [phone]

## 2019-12-18 NOTE — Progress Notes (Signed)
Patient ID: [name] [name], male   DOB: 1966/11/16, 54 y.o.   MRN: [id number]  [facility] Progress Note   Assessment/ Plan:   1. Status post PEA and V. tach arrest: Cardiogenic shock resolved, remains on amiodarone and Eliquis. Not candidate for ICD with tracheostomy in place as well as previous concerns of adherence/drug use. 2. Pulm edema/ vol overload - was not responding to serial HD, got CRRT from 12/30- 12/17/19.  Down 10kg as a result and CXR has cleared up.  3. Resp failure/ sp trach: per CCM 4. Ischemia lower extremities: poss sequelae of shock/ pressors, per VVS allowing demarcation to decide on poss surgical needs  3. ESRD - usual HD MWF. HD tomorrow.  4. Anemia: Without overt blood loss, low hemoglobin/hematocrit noted with last Aranesp dose on 12/22. 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 6. Nutrition: Ongoing tube feeds at this time, restart renal multivitamin. 7. Bilateral lower extremity ischemia: seen by vascular surgery with brisk lower extremity Doppler signals noted; allowing demarcation to decide on need for surgical intervention.  8. Palliative care: appreciate assistance   Kelly Splinter, MD 12/18/2019, 8:55 AM    Subjective:       No new issues overnight   Objective:   BP (!) 101/47   Pulse 95   Temp 98.4 F (36.9 C) (Oral)   Resp (!) 30   Ht 5' 10.5" (1.791 m)   Wt 72.2 kg   SpO2 93%   BMI 22.52 kg/m   Physical Exam: Gen: tracheostomy in place, NGT , temp HD cath CVS: Pulse regular rhythm, normal rate, S1 and S2 normal Resp: bilat coarse BS  Abd: Soft, mildly distended, bowel sounds normal Ext: LE edema resolved,  Ischemic changes of legs noted L AVF+bruit   HD OP: [facility] MWF  4h 32mn  74.5kg  2/2.5 bath  340/A1.5   P4  Hep 5000  L AVF     C3 0.75 qTx     no ESA/Fe     Hb 12s usually    Medications:    . sodium chloride   Intravenous Once  . amiodarone  400 mg Per Tube Daily  .  chlorhexidine gluconate (MEDLINE KIT)  15 mL Mouth Rinse BID  . Chlorhexidine Gluconate Cloth  6 each Topical Q0600  . darbepoetin (ARANESP) injection - DIALYSIS  100 mcg Intravenous Q Wed-HD  . docusate  100 mg Per Tube Daily  . feeding supplement (PRO-STAT SUGAR FREE 64)  30 mL Per Tube 5 X Daily  . insulin aspart  0-9 Units Subcutaneous Q4H  . mouth rinse  15 mL Mouth Rinse 10 times per day  . midodrine  5 mg Per Tube Q8H  . multivitamin  1 tablet Oral QHS  . pantoprazole sodium  40 mg Per Tube QHS  . senna-docusate  1 tablet Per Tube BID  . sodium chloride flush  10-40 mL Intracatheter Q12H  . sodium chloride flush  10-40 mL Intracatheter Q12H  . white petrolatum   Topical Daily

## 2019-12-18 NOTE — Progress Notes (Signed)
NAME:  [name] [name], MRN:  [id number], DOB:  [birth year], LOS: 15 ADMISSION DATE:  11/16/2019, CONSULTATION DATE:  12/2 REFERRING MD:  Dr. Sedonia Small, CHIEF COMPLAINT:  Post cardiac arrest    Brief History   54yo male presented post PEA arrest which occurred during iHD treatment as outpatient. This was a witnessed arrest with quick bystander CPR. Received 3 rounds of epi, 1g of calcium and 50 mEq of bicarb with ROSC after 15 minutes.   Emergent cricothyrotomy performed by EMS due to failed intubation with [facility] airway.   Past Medical History  NSVT 2017 Nonischemic cardiomyopathy Medical noncompliance Polysubstance abuse Hypertension End-stage renal disease Combined systolic and diastolic congestive heart failure Cocaine abuse Alcohol abuse Prolonged QTC  Significant Hospital Events   12/2 Admitted post cardiac arrest, emergent cric, 33 C TTM 12/8 V. tach arrest 12/10 Cric changed to tracheostomy 12/16 HD, tolerated only 30 minutes of trach collar 12/18 - He is critically ill, remains on vent. Soft blood pressure overnight but off pressors Afebrile. Rx HD 12/19 -  MAP > 65 with slow SBP. Doing PSV via trach. EP wants amio gtt cntinued through 12/21 and then decide on Dc. RN concerned about bilsters on feet and knees. She has called wound care. Not on sedation gtt. Following commands/. No active bleeding. Diarrhea + per RN 12/20: VVS recommends continued monitoring of LE and allowed necrosis to demarcate. Patient reportedly opposed to idea of amputaiton. Failed ATC and back on full vent. Amio gtt cotinues.  Afebrile.  Renam planning repeat HD v CRRT with pressor support 12/10/19 based on BP. Denies complaints other than LE pain. On TF 12/06/2019 complains of any pain with manipulation of lower extremities.  Currently on full mechanical ventilatory support.  No acute distress 12/23 did not tolerate wean well 12/24 Not tolerating wean, no events overnight 12/09/2019 -called by RN for  increased anxiety when attempted to come off vent on ATC. Pt with increased wob on atc, getting dialysis at this time. 12/27-12/29 Failed PS 12/30 - Remains volume overloaded. Place vas cath for CRRT  1/2: CRRT stopped after filter clotted.  Borderline blood pressure, appears euvolemic.  More hypotensive today. -3.6 L,  (net neg since 12/29, -6.4 L total for hosp)   1/3 remains on low dose levophed today.   Consults:  Nephrology Electrophysiology ENT Thermal 12/18   Procedures:  12/10-trach 12/4 RT fem HD cath >> out 12/2 RT [place] CVL >> 12/30 trialysis  Significant Diagnostic Tests:  CTA 12/2-segmental filling defects in the right middle lobe and right lower lobe pulmonary arteries, pneumomediastinum, subcu emphysema CT head 12/8-no acute abnormality  Echocardiogram 11/17/19-LVEF less than 20%, normal LV systolic function  Micro Data:  COVID 12/2 >> negative Aspirate 12/04 >> + for Haemophilus influenza  12/10 MRSA Nasal swab >> Positive 12/11 [place] >> negative 12/11 Respiratory CX >> Staph Epidermidis 12/31 resp culture >>  Antimicrobials:  Unasyn 12/4>>12/7 Ceftriazone 12/7>>12/13  Interim history/subjective:  CP and abdominal pain improved with NGT to LIWS yesterday and overnight. D10W overnight for hypoglycemia while TF off.  Objective   Blood pressure (!) 101/47, pulse 95, temperature 98.4 F (36.9 C), temperature source Oral, resp. rate (!) 30, height 5' 10.5" (1.791 m), weight 72.2 kg, SpO2 93 %.    Vent Mode: PRVC FiO2 (%):  [40 %] 40 % Set Rate:  [20 bmp] 20 bmp Vt Set:  [470 mL] 470 mL PEEP:  [5 cmH20] 5 cmH20 Pressure Support:  [8 cmH20] 8 cmH20 Plateau Pressure:  [  15 cmH20-19 cmH20] 15 cmH20   Intake/Output Summary (Last 24 hours) at 12/18/2019 0905 Last data filed at 12/18/2019 0600 Gross per 24 hour  Intake 4106 ml  Output 214 ml  Net 3892 ml   Filed Weights   12/17/19 0356 12/17/19 1700 12/18/19 0500  Weight: 72 kg 72 kg 72.2 kg   Recent Labs     12/18/19 0031 12/18/19 0458  HGB 7.8* 8.1*   Physical Exam: General: chronically-ill appearing man laying in bed in NAD, nods yes to question about pain HENT: [place]/AT Neck: trach in place, no erythema, mucous from trach site Eyes: eyes anicteric, EOMI Respiratory: rhonchi bilaterally,breathing comfortably on the vent. Cardiovascular: tachycardic, regular rhythm, no murmurs GI: mild distention, no tenderness,  Extremities: persistent food edema with post- ischemic/ reperfusion skin changes Neuro: awake and alert, nodding to answer questions. Moving all extremities spontaneously Skin: no rashes, demarcation of feet from previous ischemia, dp pulses faint but palpable.    Resolved Hospital Problem list    Cardiogenic shock   Assessment & Plan:   S/p PEA arrest on 12/2 S/p VT/VF cardiac arrest on 12/8 with ongoing NSVT Doubt PE but contrast timing poor, venous duplex negative, prolonged QT Cause unclear - underlying CHF?  Plan -Cont PO amio; appreciate EP's recs -Not a candidate for ICD per EP on 12/29 ("Not candidate for ICD at this time with new tracheostomy, risk of infection, risk of mortality"). Per note from 12/29, [facility] would only be arranged AFTER discharge home/facility -con't telemetry monitoring May need to have them reassess for ICD if becomes more stable, weans from vent.  -Palliative Care meeting with the family on 12/30- appreciate their input. The goal is for vent liberation, and they understand he is unlikely to return to his pre-morbid level of function  Hypotension  on levophed.  Likely 2/2 sepsis from mssa pna.    Acute on chronic respiratory failure s/p tracheostomy. Likely VAP- increasing WBC, purulent sputum, and increasing O2 requirements. Difficult intubation needing cric in field s/p exchange for tracheostomy  Actually is tolerating PSV trial for long time today, still having copious sputum output from trach.   Sputum growing staph aureus.  Plan -  narrow abtx today.  -tolerating PS all day today.  -volume management per CRRT  Ileus vs constipation -resolved, tolerating feeds.   Combined systolic and diastolic CHF EF <02% Chronic atrial fibrillation Plan -amiodarone per EP -telemetry -heparin infusion  On HD for fluid management. CRRT stopped yesterday.    RML and RLL subsegmental PE on 12/2 -heparin infusion  ESRD; has LUE fistula Plan ; trialysis can be removed once back on HD (currently using for levophed) -cont midodrine to 5 mg TID   Hypoglycemia resolved  Ischemic changes of feet  -due to low flow, obtain vascular input, now on IV heparin. Seen by VVS 12/18 - VVS concerned that patient at risk for amputation of lower extremity and blisters are signaling ischemia. Recommending supportive care and await demarcation. Patient not interested in amputation Plan  -appreciate wound care & vascular's assistance -Continue anticoagulation on heparin  Anxiety Plan -PRN alprazolam  Hx polysustance abuse- cocaine, alcohol, tobacco Plan -counseling when appropriate  Chronic Anemia, multifactorial -chronic illness, critical illness, blood loss related to frequent lab draws Plan -con't to monitor -transfuse for Hb <7 -Aranesp per Nephrology Transfused 1 unit yesterday.   Hypoalbuminemia 2.7  Goals of Care -Palliative Care has been very thoughtful with [place] discussions in setting of decompensated and end-stage chronic conditions and acute/recent  critical illness. At present, family wishes for full scope of treatment. Appreciate Palliative Care continuing these discussions   Discussed with Social Work for placement options previously.    LABS   PULMONARY Recent Labs  Lab 12/13/19 0458  PHART 7.517*  PCO2ART 35.2  PO2ART 103.0  HCO3 28.6*  TCO2 30  O2SAT 99.0    CBC Recent Labs  Lab 12/16/19 0301 12/17/19 1504 12/18/19 0031 12/18/19 0458  HGB 8.0* 7.3* 7.8* 8.1*  HCT 27.5* 24.5* 25.5* 26.2*  WBC  17.0* 14.3*  --  16.0*  PLT 342 343  --  353    COAGULATION No results for input(s): INR in the last 168 hours.  CARDIAC  No results for input(s): TROPONINI in the last 168 hours. No results for input(s): PROBNP in the last 168 hours.   CHEMISTRY Recent Labs  Lab 12/12/19 0308 12/15/19 0241 12/16/19 0301 12/16/19 1643 12/17/19 0017 12/17/19 0506 12/17/19 1504 12/18/19 0458  NA 133* 134* 136 135 134* 135 132* 130*  K 5.1 5.0 5.2* 4.6 3.9 4.3 4.4 4.3  CL 91* 96* 97* 97* 97* 96* 97* 94*  CO2 27 24 24 23 27 25 22 23   GLUCOSE 107* 101* 79 98 190* 120* 128* 123*  BUN 76* 47* 32* 35* 30* 28* 37* 56*  CREATININE 5.66* 3.36* 2.22* 2.29* 1.89* 1.88* 2.28* 3.25*  CALCIUM 8.3* 8.3* 8.9 8.7* 8.7* 8.9 8.8* 8.7*  MG 2.1 2.3 2.6*  --   --  2.5*  --  2.7*  PHOS 3.1  3.2 3.0 2.8 2.6  --  2.2* 3.0 3.9   Estimated Creatinine Clearance: 27.5 mL/min (A) (by C-G formula based on SCr of 3.25 mg/dL (H)).   LIVER Recent Labs  Lab 12/16/19 1643 12/17/19 0017 12/17/19 0506 12/17/19 1504 12/18/19 0458  AST  --  16  --   --   --   ALT  --  13  --   --   --   ALKPHOS  --  196*  --   --   --   BILITOT  --  1.1  --   --   --   PROT  --  8.1  --   --   --   ALBUMIN 2.6* 2.5* 2.7* 2.4* 2.5*     INFECTIOUS No results for input(s): LATICACIDVEN, PROCALCITON in the last 168 hours.   ENDOCRINE CBG (last 3)  Recent Labs    12/18/19 0410 12/18/19 0613 12/18/19 0714  GLUCAP 132* 100* 99     This patient is critically ill with multiple organ system failure which requires frequent high complexity decision making, assessment, support, evaluation, and titration of therapies. This was completed through the application of advanced monitoring technologies and extensive interpretation of multiple databases. During this encounter critical care time was devoted to patient care services described in this note for 45 minutes.  Collier Bullock, MD 12/18/19 9:05 AM [facility]

## 2019-12-18 NOTE — Plan of Care (Signed)
  Problem: Nutrition: Goal: Adequate nutrition will be maintained Outcome: Progressing Note: Pt is tolerating tube feeds at goal   Problem: Elimination: Goal: Will not experience complications related to bowel motility Outcome: Progressing Note: Last bowel movement 12/18/2019    Problem: Safety: Goal: Ability to remain free from injury will improve Outcome: Progressing   Problem: Activity: Goal: Risk for activity intolerance will decrease Outcome: Not Progressing Note: Pt currently bed bound, on ventilator and vasopressors    Problem: Elimination: Goal: Will not experience complications related to urinary retention Outcome: Not Applicable

## 2019-12-19 LAB — GLUCOSE, CAPILLARY
Glucose-Capillary: 98 mg/dL (ref 70–99)
Glucose-Capillary: 76 mg/dL (ref 70–99)
Glucose-Capillary: 89 mg/dL (ref 70–99)
Glucose-Capillary: 88 mg/dL (ref 70–99)
Glucose-Capillary: 105 mg/dL — ABNORMAL HIGH (ref 70–99)
Glucose-Capillary: 82 mg/dL (ref 70–99)

## 2019-12-19 LAB — CBC
HCT: 25.2 % — ABNORMAL LOW (ref 39.0–52.0)
Hemoglobin: 7.8 g/dL — ABNORMAL LOW (ref 13.0–17.0)
MCH: 28.2 pg (ref 26.0–34.0)
MCHC: 31 g/dL (ref 30.0–36.0)
MCV: 91 fL (ref 80.0–100.0)
Platelets: 324 10*3/uL (ref 150–400)
RBC: 2.77 MIL/uL — ABNORMAL LOW (ref 4.22–5.81)
RDW: 17.2 % — ABNORMAL HIGH (ref 11.5–15.5)
WBC: 14 10*3/uL — ABNORMAL HIGH (ref 4.0–10.5)
nRBC: 0 % (ref 0.0–0.2)

## 2019-12-19 LAB — COMPREHENSIVE METABOLIC PANEL
ALT: 12 U/L (ref 0–44)
AST: 11 U/L — ABNORMAL LOW (ref 15–41)
Albumin: 2.3 g/dL — ABNORMAL LOW (ref 3.5–5.0)
Alkaline Phosphatase: 153 U/L — ABNORMAL HIGH (ref 38–126)
Anion gap: 17 — ABNORMAL HIGH (ref 5–15)
BUN: 85 mg/dL — ABNORMAL HIGH (ref 6–20)
CO2: 20 mmol/L — ABNORMAL LOW (ref 22–32)
Calcium: 8.5 mg/dL — ABNORMAL LOW (ref 8.9–10.3)
Chloride: 90 mmol/L — ABNORMAL LOW (ref 98–111)
Creatinine, Ser: 4.79 mg/dL — ABNORMAL HIGH (ref 0.61–1.24)
GFR calc Af Amer: 15 mL/min — ABNORMAL LOW (ref >60)
GFR calc non Af Amer: 13 mL/min — ABNORMAL LOW (ref >60)
Glucose, Bld: 121 mg/dL — ABNORMAL HIGH (ref 70–99)
Potassium: 4.8 mmol/L (ref 3.5–5.1)
Sodium: 127 mmol/L — ABNORMAL LOW (ref 135–145)
Total Bilirubin: 1 mg/dL (ref 0.3–1.2)
Total Protein: 7.4 g/dL (ref 6.5–8.1)

## 2019-12-19 LAB — HEPARIN LEVEL (UNFRACTIONATED)
Heparin Unfractionated: 0.23 IU/mL — ABNORMAL LOW (ref 0.30–0.70)
Heparin Unfractionated: 1.16 IU/mL — ABNORMAL HIGH (ref 0.30–0.70)

## 2019-12-19 MED ORDER — APIXABAN 5 MG PO TABS
5.0000 mg | ORAL_TABLET | Freq: Two times a day (BID) | ORAL | Status: DC
  Administered 2019-12-19 – 2019-12-23 (×10): 5 mg
  Filled 2019-12-19 (×10): qty 1

## 2019-12-19 MED ORDER — CHLORHEXIDINE GLUCONATE CLOTH 2 % EX PADS
6.0000 | MEDICATED_PAD | Freq: Every day | CUTANEOUS | Status: DC
  Administered 2019-12-19 – 2019-12-22 (×4): 6 via TOPICAL

## 2019-12-19 MED ORDER — HEPARIN SODIUM (PORCINE) 1000 UNIT/ML DIALYSIS: 4000.0000 [IU] | Freq: Once | INTRAMUSCULAR | Status: DC

## 2019-12-19 NOTE — Progress Notes (Addendum)
Physical Therapy Treatment Patient Details Name: [name] [name] MRN: [id number] DOB: 01-01-1966 Today's Date: 12/19/2019    History of Present Illness 54yo male presented post PEA arrest which occurred during iHD treatment as outpatient. This was a witnessed arrest with quick bystander CPR. Emergent cricothyrotomy performed by EMS due to failed intubation with [facility] airway. 11/18/19 CRRT initiated; 12/8 v tach arrest; 12/10 crich changed to tracheostomy; 12/16 HD;  lower extremity vascular ischemia secondary to cardiac arrest and need for pressors. Vascular consulted and awaiting demarcation with likely need for amputations. PMH significant for polysubstance abuse, HTN, ESRD, CHF, nonischemic cardiomyopathy. Trach placed on 12/10.    PT Comments    Pt making slow progress. Will need to consider vent SNF or LTACH if he isn't able to wean. Possibly CIR if he weans.    Follow Up Recommendations  SNF;Supervision/Assistance - 24 hour(unless able to wean from vent)     Equipment Recommendations  Other (comment)(TBD at next venue)    Recommendations for Other Services       Precautions / Restrictions Precautions Precautions: Fall Precaution Comments: ventilated, rectal tube, feeding tube, pt weaning off vent Restrictions Weight Bearing Restrictions: No    Mobility  Bed Mobility Overal bed mobility: Needs Assistance Bed Mobility: Supine to Sit;Sit to Supine   Sidelying to sit: Mod assist;+2 for physical assistance Supine to sit: Mod assist;+2 for physical assistance Sit to supine: Mod assist;+2 for physical assistance   General bed mobility comments: Assist to bring legs off of bed, elevate trunk into sitting and bring hips to EOB. Assist to lower trunk and bring legs back up into bed.  Transfers Overall transfer level: Needs assistance Equipment used: 2 person hand held assist Transfers: Sit to/from Stand Sit to Stand: Mod assist;+2 physical assistance         General transfer  comment: Assist to bring hips up and for balance. Incr time to rise  Ambulation/Gait                 Stairs             Wheelchair Mobility    Modified Rankin (Stroke Patients Only)       Balance Overall balance assessment: Needs assistance Sitting-balance support: Bilateral upper extremity supported;Feet supported Sitting balance-Leahy Scale: Poor Sitting balance - Comments: Sat EOB x 10 minutes with min guard and UE support   Standing balance support: Bilateral upper extremity supported Standing balance-Leahy Scale: Poor Standing balance comment: Stood with bil hand held assist x 20 sec                            Cognition Arousal/Alertness: Awake/alert Behavior During Therapy: Anxious Overall Cognitive Status: Within Functional Limits for tasks assessed                                 General Comments: Pt did well asking [place] to wait and calming himself down when coughing on vent      Exercises      General Comments General comments (skin integrity, edema, etc.): Pt with SpO2 to 81% with sitting but poor waveform. Nurse incr vent support and SpO2 incr to > 90%      Pertinent Vitals/Pain Pain Assessment: Faces Faces Pain Scale: Hurts little more Pain Location: grimacing with supine<>sit Pain Descriptors / Indicators: Grimacing Pain Intervention(s): Limited activity within patient's tolerance;Monitored during session;Repositioned  Home Living                      Prior Function            PT Goals (current goals can now be found in the care plan section) Acute Rehab PT Goals PT Goal Formulation: With patient Time For Goal Achievement: 01/02/20 Potential to Achieve Goals: Fair Progress towards PT goals: Goals downgraded-see care plan    Frequency    Min 2X/week      PT Plan Discharge plan needs to be updated;Frequency needs to be updated    Co-evaluation PT/OT/SLP Co-Evaluation/Treatment:  Yes Reason for Co-Treatment: Complexity of the patient's impairments (multi-system involvement);For patient/therapist safety   OT goals addressed during session: Strengthening/ROM      AM-PAC PT "6 Clicks" Mobility   Outcome Measure  Help needed turning from your back to your side while in a flat bed without using bedrails?: A Lot Help needed moving from lying on your back to sitting on the side of a flat bed without using bedrails?: A Lot Help needed moving to and from a bed to a chair (including a wheelchair)?: Total Help needed standing up from a chair using your arms (e.g., wheelchair or bedside chair)?: A Lot Help needed to walk in hospital room?: Total Help needed climbing 3-5 steps with a railing? : Total 6 Click Score: 9    End of Session Equipment Utilized During Treatment: Oxygen(vent) Activity Tolerance: Patient limited by fatigue Patient left: in bed;with call bell/phone within reach;with bed alarm set;Other (comment)(HD present) Nurse Communication: Mobility status PT Visit Diagnosis: Unsteadiness on feet (R26.81);Other abnormalities of gait and mobility (R26.89);Muscle weakness (generalized) (M62.81)     Time: 0071-2197 PT Time Calculation (min) (ACUTE ONLY): 23 min  Charges:  $Therapeutic Activity: 8-22 mins                     [place] Pager [phone] Office [place] 12/19/2019, 1:38 PM

## 2019-12-19 NOTE — Progress Notes (Signed)
Tube feeds turned off d/t patients c/o abdominal pain. Patient has bowel sounds and abdomen is distended and tender.

## 2019-12-19 NOTE — Progress Notes (Signed)
Patient has had multiple bowel movements today (7 loose stools)  [facility] made aware and flexi seal replaced since he already has a order. Will continue to monitor.

## 2019-12-19 NOTE — Progress Notes (Addendum)
[place] for Heparin (Holding apixaban) Indication: atrial fibrillation  Allergies  Allergen Reactions  . Losartan Cough    Patient Measurements: Height: 5' 10.5" (179.1 cm) Weight: 159 lb 2.8 oz (72.2 kg) IBW/kg (Calculated) : 74.15  Vital Signs: Temp: 97.6 F (36.4 C) (01/04 0725) Temp Source: Oral (01/04 0725) BP: 113/67 (01/04 0630) Pulse Rate: 71 (01/04 0630)  Labs: Recent Labs    12/17/19 1504 12/18/19 0030 12/18/19 0031 12/18/19 0458 12/18/19 1004 12/19/19 0432 12/19/19 0446  HGB 7.3*  --  7.8* 8.1*  --  7.8*  --   HCT 24.5*  --  25.5* 26.2*  --  25.2*  --   PLT 343  --   --  353  --  324  --   APTT 50* 86*  --   --  77*  --   --   HEPARINUNFRC 0.71*  --   --   --  0.48 0.23*  --   CREATININE 2.28*  --   --  3.25*  --   --  4.79*    Estimated Creatinine Clearance: 18.6 mL/min (A) (by C-G formula based on SCr of 4.79 mg/dL (H)).  Assessment: 54 yo m with afib on apixaban PTA but transitioned to IV heparin. No bleeding noted. H/H plts are stable  Hep lvl 0.23 - slightly low  CBC stable  Goal of Therapy:  Heparin level 0.3-0.7 units/ml  Monitor platelets by anticoagulation protocol: Yes   Plan:  Increase heparin to 1300 units/hr Convert back to apixaban 5 mg bid  Barth Kirks, PharmD, BCPS, BCCCP Clinical Pharmacist [phone]  Please check AMION for all [place] numbers  12/19/2019 8:06 AM

## 2019-12-19 NOTE — Progress Notes (Signed)
Occupational Therapy Treatment Patient Details Name: [name] [name] MRN: [id number] DOB: 29-May-1966 Today's Date: 12/19/2019    History of present illness 54yo male presented post PEA arrest which occurred during iHD treatment as outpatient. This was a witnessed arrest with quick bystander CPR. Emergent cricothyrotomy performed by EMS due to failed intubation with [facility] airway. 11/18/19 CRRT initiated; 12/8 v tach arrest; 12/10 crich changed to tracheostomy; 12/16 HD;  lower extremity vascular ischemia secondary to cardiac arrest and need for pressors. Vascular consulted and awaiting demarcation with likely need for amputations. PMH significant for polysubstance abuse, HTN, ESRD, CHF, nonischemic cardiomyopathy. Trach placed on 12/10.   OT comments  This 54 yo male admitted with above presents to acute OT with not being as anxious today and working through his coughing while seated EOB as well as motioning for suction when he needed it. He was also able to stand at EOB today. He will continue to benefit from acute OT with follow up on CIR.  Follow Up Recommendations  Supervision/Assistance - 24 hour;CIR    Equipment Recommendations  3 in 1 bedside commode    Recommendations for Other Services Rehab consult    Precautions / Restrictions Precautions Precautions: Fall Precaution Comments: ventilated, rectal tube, feeding tube, pt weaning off vent Restrictions Weight Bearing Restrictions: No       Mobility Bed Mobility Overal bed mobility: Needs Assistance Bed Mobility: Supine to Sit;Sit to Supine   Sidelying to sit: Mod assist;+2 for physical assistance Supine to sit: Mod assist;+2 for physical assistance     General bed mobility comments: A for trunk and legs  Transfers Overall transfer level: Needs assistance Equipment used: 2 person hand held assist Transfers: Sit to/from Stand Sit to Stand: Mod assist;+2 physical assistance         General transfer comment: Pt able to  stand with +2 Mod A with increased time and effort    Balance Overall balance assessment: Needs assistance Sitting-balance support: Bilateral upper extremity supported;Feet supported Sitting balance-Leahy Scale: Fair Sitting balance - Comments: Pt able to sit EOB ~10 minutes with min-mingaurd A and work on his breathing through periods of coughing   Standing balance support: Bilateral upper extremity supported Standing balance-Leahy Scale: Poor Standing balance comment: Pt able to achieve full upright standing with increased time and effort today, but unable to side step up to [place] Patient Visual Report: No change from baseline            Cognition Arousal/Alertness: Awake/alert Behavior During Therapy: Anxious Overall Cognitive Status: Within Functional Limits for tasks assessed                                 General Comments: Pt did well asking [place] to wait and calming himself down when coughing on vent                   Pertinent Vitals/ Pain       Pain Assessment: Faces Faces Pain Scale: Hurts little more Pain Location: grimacing with supine<>sit Pain Descriptors / Indicators: Grimacing Pain Intervention(s): Limited activity within patient's tolerance;Monitored during session;Repositioned         Frequency  Min 2X/week        Progress Toward Goals  OT Goals(current goals can now be found in the care plan section)  Progress towards OT goals: Progressing toward goals     Plan Discharge plan remains appropriate    Co-evaluation    PT/OT/SLP Co-Evaluation/Treatment: Yes Reason for Co-Treatment: Complexity of the patient's impairments (multi-system involvement);For patient/therapist safety   OT goals addressed during session: Strengthening/ROM      AM-PAC OT "6 Clicks" Daily Activity     Outcome Measure   Help from another person eating meals?: Total Help from another person  taking care of personal grooming?: A Lot Help from another person toileting, which includes using toliet, bedpan, or urinal?: A Lot Help from another person bathing (including washing, rinsing, drying)?: A Lot Help from another person to put on and taking off regular upper body clothing?: A Lot Help from another person to put on and taking off regular lower body clothing?: Total 6 Click Score: 10    End of Session Equipment Utilized During Treatment: (on vent)  OT Visit Diagnosis: Other abnormalities of gait and mobility (R26.89);Muscle weakness (generalized) (M62.81);Pain   Activity Tolerance Patient tolerated treatment well   Patient Left in bed;with call bell/phone within reach;with bed alarm set           Time: 7262-0355 OT Time Calculation (min): 23 min  Charges: OT General Charges $OT Visit: 1 Visit OT Treatments $Self Care/Home Management : 8-22 mins  [place]  OTR/L Acute [employer] Pager [phone] Office [phone]      12/19/2019, 12:53 PM

## 2019-12-19 NOTE — Progress Notes (Signed)
[facility] NEPHROLOGY PROGRESS NOTE  Assessment/ Plan: Pt is a 54 y.o. yo male ESRD on HD, status post cardiac arrest and cardiogenic shock.  HD OP: [place] MWF, 4h 95mn 74.5kg 2/2.5 bath 340/A1.5 P4 Hep 5000  L AVF, C3 0.75 qTx, no ESA/Fe  #PEA cardiac arrest/pulmonary edema and cardiogenic shock: On Eliquis and amiodarone.  Not a candidate for ICD with a tracheostomy in place.  # ESRD: MWF.  Required CRRT from 12/30 to 12/17/2019 for fluid overload.  Plan for HD today.  Blood pressure is okay.  # Anemia: Continue Aranesp.  Monitor hemoglobin  # Secondary hyperparathyroidism: Phosphorus level acceptable.  # HTN/volume blood pressure acceptable.  #Hyponatremia, hypervolemic: Manage with dialysis.  #Acute respiratory failure: Has tracheostomy.  On vent with 40% FiO2.  Subjective: Seen and examined in ICU.  Remains intubated.  Able to follow commands.  No new event.  Plan for HD today. Objective Vital signs in last 24 hours: Vitals:   12/19/19 0600 12/19/19 0630 12/19/19 0725 12/19/19 0754  BP: (!) 112/59 113/67    Pulse:  71  89  Resp: (!) 21 19  (!) 26  Temp:   97.6 F (36.4 C)   TempSrc:   Oral   SpO2:  100%  98%  Weight:      Height:       Weight change:   Intake/Output Summary (Last 24 hours) at 12/19/2019 0902 Last data filed at 12/19/2019 0630 Gross per 24 hour  Intake 2248.02 ml  Output 100 ml  Net 2148.02 ml       Labs: Basic Metabolic Panel: Recent Labs  Lab 12/17/19 0506 12/17/19 1504 12/18/19 0458 12/19/19 0446  NA 135 132* 130* 127*  K 4.3 4.4 4.3 4.8  CL 96* 97* 94* 90*  CO2 _0 20*  GLUCOSE 120* 128* 123* 121*  BUN 28* 37* 56* 85*  CREATININE 1.88* 2.28* 3.25* 4.79*  CALCIUM 8.9 8.8* 8.7* 8.5*  PHOS 2.2* 3.0 3.9  --    Liver Function Tests: Recent Labs  Lab 12/17/19 0017 12/17/19 1504 12/18/19 0458 12/19/19 0446  AST 16  --   --  11*  ALT 13  --   --  12  ALKPHOS 196*  --   --  153*  BILITOT 1.1  --   --   1.0  PROT 8.1  --   --  7.4  ALBUMIN 2.5* 2.4* 2.5* 2.3*   No results for input(s): LIPASE, AMYLASE in the last 168 hours. No results for input(s): AMMONIA in the last 168 hours. CBC: Recent Labs  Lab 12/15/19 0241 12/16/19 0301 12/17/19 1504 12/18/19 0031 12/18/19 0458 12/19/19 0432  WBC 12.4* 17.0* 14.3*  --  16.0* 14.0*  NEUTROABS  --   --  10.6*  --   --   --   HGB 7.7* 8.0* 7.3* 7.8* 8.1* 7.8*  HCT 25.5* 27.5* 24.5* 25.5* 26.2* 25.2*  MCV 91.7 94.8 92.8  --  92.6 91.0  PLT 291 342 343  --  353 324   Cardiac Enzymes: No results for input(s): CKTOTAL, CKMB, CKMBINDEX, TROPONINI in the last 168 hours. CBG: Recent Labs  Lab 12/18/19 1106 12/18/19 1922 12/18/19 2344 12/19/19 0346 12/19/19 0722  GLUCAP 113* 98 98 98 76    Iron Studies: No results for input(s): IRON, TIBC, TRANSFERRIN, FERRITIN in the last 72 hours. Studies/Results: DG CHEST PORT 1 VIEW  Result Date: 12/18/2019 CLINICAL DATA:  Pneumonia. EXAM: PORTABLE CHEST 1 VIEW COMPARISON:  December 17, 2019. FINDINGS: Stable cardiomegaly. Endotracheal, nasogastric and feeding tubes are unchanged in position. Right internal jugular catheter is unchanged. No pneumothorax is noted. Stable bibasilar atelectasis or infiltrates are noted with probable small left pleural effusion. Bony thorax is unremarkable. IMPRESSION: Stable support apparatus. Stable bibasilar atelectasis or infiltrates are noted with probable small left pleural effusion. Electronically Signed   By: Marijo Conception M.D.   On: 12/18/2019 09:56   DG CHEST PORT 1 VIEW  Result Date: 12/17/2019 CLINICAL DATA:  Pulmonary infiltrates EXAM: PORTABLE CHEST 1 VIEW COMPARISON:  Chest radiograph 12/16/2019 FINDINGS: Stable support apparatus. Stable cardiomediastinal contours with enlarged heart size. There is improved aeration in the bilateral lungs with persistent mild opacities at the right lung base and consolidation at the left lung base. Possible small left pleural  effusion. No pneumothorax. IMPRESSION: Improved aeration in the bilateral lungs.  Stable support apparatus. Electronically Signed   By: Audie Pinto M.D.   On: 12/17/2019 10:12    Medications: Infusions: . sodium chloride 10 mL/hr at 12/16/19 1142  .  ceFAZolin (ANCEF) IV Stopped (12/18/19 1750)  . feeding supplement (VITAL 1.5 CAL) Stopped (12/19/19 0630)  . heparin 1,300 Units/hr (12/19/19 0810)  . norepinephrine (LEVOPHED) Adult infusion Stopped (12/18/19 1922)    Scheduled Medications: . sodium chloride   Intravenous Once  . amiodarone  400 mg Per Tube Daily  . apixaban  5 mg Per Tube BID  . chlorhexidine gluconate (MEDLINE KIT)  15 mL Mouth Rinse BID  . Chlorhexidine Gluconate Cloth  6 each Topical Q0600  . darbepoetin (ARANESP) injection - DIALYSIS  100 mcg Intravenous Q Wed-HD  . docusate  100 mg Per Tube Daily  . feeding supplement (PRO-STAT SUGAR FREE 64)  30 mL Per Tube 5 X Daily  . insulin aspart  0-9 Units Subcutaneous Q4H  . mouth rinse  15 mL Mouth Rinse 10 times per day  . midodrine  5 mg Per Tube Q8H  . multivitamin  1 tablet Oral QHS  . pantoprazole sodium  40 mg Per Tube QHS  . senna-docusate  1 tablet Per Tube BID  . sodium chloride flush  10-40 mL Intracatheter Q12H  . sodium chloride flush  10-40 mL Intracatheter Q12H  . white petrolatum   Topical Daily    have reviewed scheduled and prn medications.  Physical Exam: General:NAD, comfortable, tracheostomy in place.  Following simple commands Heart:RRR, s1s2 nl Lungs: Bilateral basal coarse sound, no increased work of breathing Abdomen:soft, Non-tender, non-distended Extremities:No edema Dialysis Access: Left AVF has bruit  Roselia Snipe Tanna Furry 12/19/2019,9:02 AM  LOS: 33 days  Pager: [phone]

## 2019-12-19 NOTE — Progress Notes (Signed)
NAME:  [name] [name], MRN:  [id number], DOB:  01-29-1966, LOS: 73 ADMISSION DATE:  11/16/2019, CONSULTATION DATE:  12/2 REFERRING MD:  Dr. Sedonia Small, CHIEF COMPLAINT:  Post cardiac arrest    Brief History   54yo male presented post PEA arrest which occurred during iHD treatment as outpatient. This was a witnessed arrest with quick bystander CPR. Received 3 rounds of epi, 1g of calcium and 50 mEq of bicarb with ROSC after 15 minutes.   Emergent cricothyrotomy performed by EMS due to failed intubation with [facility] airway.   Past Medical History  NSVT 2017 Nonischemic cardiomyopathy Medical noncompliance Polysubstance abuse Hypertension End-stage renal disease Combined systolic and diastolic congestive heart failure Cocaine abuse Alcohol abuse Prolonged QTC  Significant Hospital Events   12/2 Admitted post cardiac arrest, emergent cric, 33 C TTM 12/8 V. tach arrest 12/10 Cric changed to tracheostomy 12/16 HD, tolerated only 30 minutes of trach collar 12/18 - He is critically ill, remains on vent. Soft blood pressure overnight but off pressors Afebrile. Rx HD 12/19 -  MAP > 65 with slow SBP. Doing PSV via trach. EP wants amio gtt cntinued through 12/21 and then decide on Dc. RN concerned about bilsters on feet and knees. She has called wound care. Not on sedation gtt. Following commands/. No active bleeding. Diarrhea + per RN 12/20: VVS recommends continued monitoring of LE and allowed necrosis to demarcate. Patient reportedly opposed to idea of amputaiton. Failed ATC and back on full vent. Amio gtt cotinues.  Afebrile.  Renam planning repeat HD v CRRT with pressor support 12/10/19 based on BP. Denies complaints other than LE pain. On TF 12/06/2019 complains of any pain with manipulation of lower extremities.  Currently on full mechanical ventilatory support.  No acute distress 12/23 did not tolerate wean well 12/24 Not tolerating wean, no events overnight 12/09/2019 -called by RN for  increased anxiety when attempted to come off vent on ATC. Pt with increased wob on atc, getting dialysis at this time. 12/27-12/29 Failed PS 12/30 - Remains volume overloaded. Place vas cath for CRRT  1/2: CRRT stopped after filter clotted.  Borderline blood pressure, appears euvolemic.  More hypotensive today. -3.6 L,  (net neg since 12/29, -6.4 L total for hosp)   1/3 remains on low dose levophed today.   Consults:  Nephrology Electrophysiology ENT [place] 12/18   Procedures:  12/10-trach 12/4 RT fem HD cath >> out 12/2 RT [place] CVL >> 12/30 trialysis  Significant Diagnostic Tests:  CTA 12/2-segmental filling defects in the right middle lobe and right lower lobe pulmonary arteries, pneumomediastinum, subcu emphysema CT head 12/8-no acute abnormality  Echocardiogram 11/17/19-LVEF less than 20%, normal LV systolic function  Micro Data:  COVID 12/2 >> negative Aspirate 12/04 >> + for Haemophilus influenza  12/10 MRSA Nasal swab >> Positive 12/11 [place] >> negative 12/11 Respiratory CX >> Staph Epidermidis 12/31 resp culture >>  Antimicrobials:  Unasyn 12/4>>12/7 Ceftriazone 12/7>>12/13  Interim history/subjective:  Not tolerating weaning this AM No further events overnight  Objective   Blood pressure 113/67, pulse 71, temperature 97.6 F (36.4 C), temperature source Oral, resp. rate 19, height 5' 10.5" (1.791 m), weight 72.2 kg, SpO2 100 %.    Vent Mode: PRVC FiO2 (%):  [40 %] 40 % Set Rate:  [20 bmp] 20 bmp Vt Set:  [470 mL] 470 mL PEEP:  [5 cmH20] 5 cmH20 Pressure Support:  [10 cmH20] 10 cmH20 Plateau Pressure:  [18 cmH20-19 cmH20] 19 cmH20   Intake/Output Summary (Last 24  hours) at 12/19/2019 0801 Last data filed at 12/19/2019 0630 Gross per 24 hour  Intake 2414.07 ml  Output 100 ml  Net 2314.07 ml   Filed Weights   12/17/19 0356 12/17/19 1700 12/18/19 0500  Weight: 72 kg 72 kg 72.2 kg   Recent Labs    12/18/19 0458 12/19/19 0432  HGB 8.1* 7.8*   Physical  Exam: General: Chronically ill appearing male, acutely in respiratory distress on a wean HENT: [place]/AT, PERRL, EOM-I and MMM, trach in place Neck: trach in place Respiratory: Diffuse rhonchi Cardiovascular: Sinus tach, Nl S1/S2 and -M/R/G GI: Soft, NT, ND and +BS Extremities: persistent food edema with post- ischemic/ reperfusion skin changes Neuro: awake and alert, nodding to answer questions. Moving all extremities spontaneously Skin: no rashes, demarcation of feet from previous ischemia, dp pulses faint but palpable.   I reviewed CXR myself, trach is in a good position  Discussed with bedside RN and RT  Resolved Hospital Problem list    Cardiogenic shock   Assessment & Plan:   S/p PEA arrest on 12/2 S/p VT/VF cardiac arrest on 12/8 with ongoing NSVT Doubt PE but contrast timing poor, venous duplex negative, prolonged QT Cause unclear - underlying CHF?  Plan - Continue PO amio for now - Not a candidate for ICD per EP on 12/29 ("Not candidate for ICD at this time with new tracheostomy, risk of infection, risk of mortality"). Per note from 12/29, [facility] would only be arranged AFTER discharge home/facility - Continue tele monitoring - May need to have them reassess for ICD if becomes more stable, weans from vent.  - Palliative following - Needs placement in a vent SNF at this point  Hypotension - on levophed.  - Likely 2/2 sepsis from mssa pna.   Acute on chronic respiratory failure s/p tracheostomy. Likely VAP- increasing WBC, purulent sputum, and increasing O2 requirements. Difficult intubation needing cric in field s/p exchange for tracheostomy  Actually is tolerating PSV trial for long time today, still having copious sputum output from trach.   Sputum growing staph aureus.  Plan - Narrow abtx today.  - Tolerating PS all day today.  - Volume management per HD  Ileus vs constipation - Resolved, tolerating feeds.   Combined systolic and diastolic CHF EF <14% Chronic  atrial fibrillation Plan - Amiodarone per EP - Telemetry - Heparin infusion  - On HD for fluid management. CRRT stopped yesterday.    RML and RLL subsegmental PE on 12/2 - Heparin infusion  ESRD; has LUE fistula Plan - Trialysis can be removed once back on HD (currently using for levophed) - Cont midodrine to 5 mg TID   MSSA Sputum: - Ancef for 1 week, stop date on 1/7  Hypoglycemia - Resolved  Ischemic changes of feet  - Due to low flow, obtain vascular input, now on IV heparin. Seen by VVS 12/18 - VVS concerned that patient at risk for amputation of lower extremity and blisters are signaling ischemia. Recommending supportive care and await demarcation. Patient not interested in amputation Plan  -appreciate wound care & vascular's assistance -Continue anticoagulation on heparin  Anxiety Plan - PRN alprazolam  Hx polysustance abuse- cocaine, alcohol, tobacco Plan - Counseling when appropriate  Chronic Anemia, multifactorial -chronic illness, critical illness, blood loss related to frequent lab draws Plan - Con't to monitor - Transfuse for Hb <7 - Aranesp per Nephrology - Transfused 1 unit yesterday.   Hypoalbuminemia 2.7  Goals of Care - Palliative Care has been very thoughtful with  [place] discussions in setting of decompensated and end-stage chronic conditions and acute/recent critical illness. At present, family wishes for full scope of treatment. Appreciate Palliative Care continuing these discussions   Discussed with Social Work for placement options previously.  LABS   PULMONARY Recent Labs  Lab 12/13/19 0458  PHART 7.517*  PCO2ART 35.2  PO2ART 103.0  HCO3 28.6*  TCO2 30  O2SAT 99.0    CBC Recent Labs  Lab 12/17/19 1504 12/18/19 0031 12/18/19 0458 12/19/19 0432  HGB 7.3* 7.8* 8.1* 7.8*  HCT 24.5* 25.5* 26.2* 25.2*  WBC 14.3*  --  16.0* 14.0*  PLT 343  --  353 324    COAGULATION No results for input(s): INR in the last 168  hours.  CARDIAC  No results for input(s): TROPONINI in the last 168 hours. No results for input(s): PROBNP in the last 168 hours.   CHEMISTRY Recent Labs  Lab 12/15/19 0241 12/16/19 0301 12/16/19 1643 12/17/19 0017 12/17/19 0506 12/17/19 1504 12/18/19 0458 12/19/19 0446  NA 134* 136 135 134* 135 132* 130* 127*  K 5.0 5.2* 4.6 3.9 4.3 4.4 4.3 4.8  CL 96* 97* 97* 97* 96* 97* 94* 90*  CO2 24 24 23 27 25 22 23  20*  GLUCOSE 101* 79 98 190* 120* 128* 123* 121*  BUN 47* 32* 35* 30* 28* 37* 56* 85*  CREATININE 3.36* 2.22* 2.29* 1.89* 1.88* 2.28* 3.25* 4.79*  CALCIUM 8.3* 8.9 8.7* 8.7* 8.9 8.8* 8.7* 8.5*  MG 2.3 2.6*  --   --  2.5*  --  2.7*  --   PHOS 3.0 2.8 2.6  --  2.2* 3.0 3.9  --    Estimated Creatinine Clearance: 18.6 mL/min (A) (by C-G formula based on SCr of 4.79 mg/dL (H)).   LIVER Recent Labs  Lab 12/17/19 0017 12/17/19 0506 12/17/19 1504 12/18/19 0458 12/19/19 0446  AST 16  --   --   --  11*  ALT 13  --   --   --  12  ALKPHOS 196*  --   --   --  153*  BILITOT 1.1  --   --   --  1.0  PROT 8.1  --   --   --  7.4  ALBUMIN 2.5* 2.7* 2.4* 2.5* 2.3*     INFECTIOUS No results for input(s): LATICACIDVEN, PROCALCITON in the last 168 hours.   ENDOCRINE CBG (last 3)  Recent Labs    12/18/19 2344 12/19/19 0346 12/19/19 0722  GLUCAP 98 98 76   The patient is critically ill with multiple organ systems failure and requires high complexity decision making for assessment and support, frequent evaluation and titration of therapies, application of advanced monitoring technologies and extensive interpretation of multiple databases.   Critical Care Time devoted to patient care services described in this note is  31  Minutes. This time reflects time of care of this signee Dr Jennet Maduro. This critical care time does not reflect procedure time, or teaching time or supervisory time of PA/NP/Med student/Med Resident etc but could involve care discussion time.  Rush Farmer, M.D. [facility] Pulmonary/Critical Care Medicine.

## 2019-12-20 LAB — GLUCOSE, CAPILLARY
Glucose-Capillary: 106 mg/dL — ABNORMAL HIGH (ref 70–99)
Glucose-Capillary: 107 mg/dL — ABNORMAL HIGH (ref 70–99)
Glucose-Capillary: 107 mg/dL — ABNORMAL HIGH (ref 70–99)
Glucose-Capillary: 108 mg/dL — ABNORMAL HIGH (ref 70–99)
Glucose-Capillary: 109 mg/dL — ABNORMAL HIGH (ref 70–99)
Glucose-Capillary: 116 mg/dL — ABNORMAL HIGH (ref 70–99)

## 2019-12-20 LAB — HEPARIN LEVEL (UNFRACTIONATED): Heparin Unfractionated: 2.18 IU/mL — ABNORMAL HIGH (ref 0.30–0.70)

## 2019-12-20 LAB — BASIC METABOLIC PANEL
Anion gap: 15 (ref 5–15)
BUN: 58 mg/dL — ABNORMAL HIGH (ref 6–20)
CO2: 24 mmol/L (ref 22–32)
Calcium: 8.4 mg/dL — ABNORMAL LOW (ref 8.9–10.3)
Chloride: 91 mmol/L — ABNORMAL LOW (ref 98–111)
Creatinine, Ser: 3.84 mg/dL — ABNORMAL HIGH (ref 0.61–1.24)
GFR calc Af Amer: 20 mL/min — ABNORMAL LOW (ref >60)
GFR calc non Af Amer: 17 mL/min — ABNORMAL LOW (ref >60)
Glucose, Bld: 107 mg/dL — ABNORMAL HIGH (ref 70–99)
Potassium: 3.9 mmol/L (ref 3.5–5.1)
Sodium: 130 mmol/L — ABNORMAL LOW (ref 135–145)

## 2019-12-20 LAB — CBC
HCT: 23 % — ABNORMAL LOW (ref 39.0–52.0)
Hemoglobin: 7 g/dL — ABNORMAL LOW (ref 13.0–17.0)
MCH: 28.1 pg (ref 26.0–34.0)
MCHC: 30.4 g/dL (ref 30.0–36.0)
MCV: 92.4 fL (ref 80.0–100.0)
Platelets: 324 10*3/uL (ref 150–400)
RBC: 2.49 MIL/uL — ABNORMAL LOW (ref 4.22–5.81)
RDW: 17.4 % — ABNORMAL HIGH (ref 11.5–15.5)
WBC: 10.5 10*3/uL (ref 4.0–10.5)
nRBC: 0.2 % (ref 0.0–0.2)

## 2019-12-20 LAB — MAGNESIUM: Magnesium: 2.5 mg/dL — ABNORMAL HIGH (ref 1.7–2.4)

## 2019-12-20 LAB — PHOSPHORUS: Phosphorus: 4.6 mg/dL (ref 2.5–4.6)

## 2019-12-20 MED ORDER — CHLORHEXIDINE GLUCONATE CLOTH 2 % EX PADS
6.0000 | MEDICATED_PAD | Freq: Every day | CUTANEOUS | Status: DC
  Administered 2019-12-21: 6 via TOPICAL

## 2019-12-20 MED ORDER — MIDODRINE HCL 5 MG PO TABS
5.0000 mg | ORAL_TABLET | Freq: Three times a day (TID) | ORAL | Status: DC
  Administered 2019-12-20 – 2019-12-21 (×4): 5 mg
  Filled 2019-12-20 (×5): qty 1

## 2019-12-20 NOTE — Discharge Instructions (Signed)

## 2019-12-20 NOTE — Progress Notes (Signed)
[facility] NEPHROLOGY PROGRESS NOTE  Assessment/ Plan: Pt is a 54 y.o. yo male ESRD on HD, status post cardiac arrest and cardiogenic shock.  HD OP: [place] MWF, 4h 81mn 74.5kg 2/2.5 bath 340/A1.5 P4 Hep 5000  L AVF, C3 0.75 qTx, no ESA/Fe  #PEA cardiac arrest/pulmonary edema and cardiogenic shock: On Eliquis and amiodarone.  Not a candidate for ICD with a tracheostomy in place.  # ESRD: MWF.  Required CRRT from 12/30 to 12/17/2019 for fluid overload.   Status post HD yesterday with 1620 cc UF, tolerated well.  Plan for next HD tomorrow.  # Anemia: Continue Aranesp.  Monitor hemoglobin, transfuse if hemoglobin less than 7.  # Secondary hyperparathyroidism: Phosphorus level acceptable.  # HTN/volume blood pressure acceptable.  #Hyponatremia, hypervolemic: Manage with dialysis.  #Acute respiratory failure: Has tracheostomy.  On vent with 40% FiO2.  Subjective: Seen and examined in ICU.  Remains intubated.  Able to follow commands.  No new event. Objective Vital signs in last 24 hours: Vitals:   12/20/19 0429 12/20/19 0500 12/20/19 0600 12/20/19 0714  BP: (!) 98/46 98/64 115/68 (!) 113/49  Pulse:  88 83 79  Resp:  _0 Temp:    98.5 F (36.9 C)  TempSrc:    Oral  SpO2: 100% 100% 100% 100%  Weight:      Height:       Weight change:   Intake/Output Summary (Last 24 hours) at 12/20/2019 0842 Last data filed at 12/20/2019 0600 Gross per 24 hour  Intake 751.81 ml  Output 2420 ml  Net -1668.19 ml       Labs: Basic Metabolic Panel: Recent Labs  Lab 12/17/19 1504 12/18/19 0458 12/19/19 0446 12/20/19 0356  NA 132* 130* 127* 130*  K 4.4 4.3 4.8 3.9  CL 97* 94* 90* 91*  CO2 22 23 20* 24  GLUCOSE 128* 123* 121* 107*  BUN 37* 56* 85* 58*  CREATININE 2.28* 3.25* 4.79* 3.84*  CALCIUM 8.8* 8.7* 8.5* 8.4*  PHOS 3.0 3.9  --  4.6   Liver Function Tests: Recent Labs  Lab 12/17/19 0017 12/17/19 1504 12/18/19 0458 12/19/19 0446  AST 16  --    --  11*  ALT 13  --   --  12  ALKPHOS 196*  --   --  153*  BILITOT 1.1  --   --  1.0  PROT 8.1  --   --  7.4  ALBUMIN 2.5* 2.4* 2.5* 2.3*   No results for input(s): LIPASE, AMYLASE in the last 168 hours. No results for input(s): AMMONIA in the last 168 hours. CBC: Recent Labs  Lab 12/16/19 0301 12/17/19 1504 12/18/19 0458 12/19/19 0432 12/20/19 0356  WBC 17.0* 14.3* 16.0* 14.0* 10.5  NEUTROABS  --  10.6*  --   --   --   HGB 8.0* 7.3* 8.1* 7.8* 7.0*  HCT 27.5* 24.5* 26.2* 25.2* 23.0*  MCV 94.8 92.8 92.6 91.0 92.4  PLT 342 343 353 324 324   Cardiac Enzymes: No results for input(s): CKTOTAL, CKMB, CKMBINDEX, TROPONINI in the last 168 hours. CBG: Recent Labs  Lab 12/19/19 1508 12/19/19 1913 12/19/19 2312 12/20/19 0355 12/20/19 0725  GLUCAP 88 105* 82 106* 107*    Iron Studies: No results for input(s): IRON, TIBC, TRANSFERRIN, FERRITIN in the last 72 hours. Studies/Results: DG CHEST PORT 1 VIEW  Result Date: 12/18/2019 CLINICAL DATA:  Pneumonia. EXAM: PORTABLE CHEST 1 VIEW COMPARISON:  December 17, 2019. FINDINGS: Stable cardiomegaly. Endotracheal, nasogastric  and feeding tubes are unchanged in position. Right internal jugular catheter is unchanged. No pneumothorax is noted. Stable bibasilar atelectasis or infiltrates are noted with probable small left pleural effusion. Bony thorax is unremarkable. IMPRESSION: Stable support apparatus. Stable bibasilar atelectasis or infiltrates are noted with probable small left pleural effusion. Electronically Signed   By: Marijo Conception M.D.   On: 12/18/2019 09:56    Medications: Infusions: . sodium chloride 10 mL/hr at 12/20/19 0349  .  ceFAZolin (ANCEF) IV Stopped (12/19/19 1825)  . feeding supplement (VITAL 1.5 CAL) 45 mL/hr at 12/19/19 2300    Scheduled Medications: . sodium chloride   Intravenous Once  . amiodarone  400 mg Per Tube Daily  . apixaban  5 mg Per Tube BID  . chlorhexidine gluconate (MEDLINE KIT)  15 mL Mouth Rinse  BID  . Chlorhexidine Gluconate Cloth  6 each Topical Q0600  . darbepoetin (ARANESP) injection - DIALYSIS  100 mcg Intravenous Q Wed-HD  . docusate  100 mg Per Tube Daily  . feeding supplement (PRO-STAT SUGAR FREE 64)  30 mL Per Tube 5 X Daily  . heparin  4,000 Units Dialysis Once in dialysis  . insulin aspart  0-9 Units Subcutaneous Q4H  . mouth rinse  15 mL Mouth Rinse 10 times per day  . midodrine  5 mg Per Tube Q8H  . multivitamin  1 tablet Oral QHS  . pantoprazole sodium  40 mg Per Tube QHS  . senna-docusate  1 tablet Per Tube BID  . sodium chloride flush  10-40 mL Intracatheter Q12H  . sodium chloride flush  10-40 mL Intracatheter Q12H  . white petrolatum   Topical Daily    have reviewed scheduled and prn medications.  Physical Exam: General: Tracheostomy in place, lying flat, alert.  On vent Heart:RRR, s1s2 nl Lungs: Bilateral basal coarse sound, no increased work of breathing Abdomen:soft, Non-tender, non-distended Extremities:No edema Dialysis Access: Left AVF has bruit  Harika Laidlaw Prasad Jolly Carlini 12/20/2019,8:42 AM  LOS: 34 days  Pager: [phone]

## 2019-12-20 NOTE — Progress Notes (Signed)
[place] Progress Note Patient Name: [name] [name] DOB: [birth year] MRN: [id number]   Date of Service  12/20/2019  HPI/Events of Note  Multiple issuse: 1 Nursing states that patient has new new swelling and 2. Anxiety - Patient sleeping post fentanyl dose on video exam.   eICU Interventions  Will ask ground team to evaluate new neck swelling at bedside.      Intervention Category Major Interventions: Other:;Delirium, psychosis, severe agitation - evaluation and management  Lysle Dingwall 12/20/2019, 8:09 PM

## 2019-12-20 NOTE — Consult Note (Signed)
[place] Nurse Consult Note: Patient receiving care in [facility] 2M03. Reason for Consult: "knee and ankle wounds" Wound type: ischemic. I spoke with Vaughan Basta prior to seeing the patient.  She explained her primary concern is with the "dry dressing" and hydrogel order for the knees.  She explained that the dressings have to be "soaked" to remove them without tearing off skin.  She reports she did not have concerns about the feet/ankles. Pressure Injury POA: Yes/No/NA Measurement: Wound bed: The knee wounds are healing, there are areas of hypopigmentation.   Drainage (amount, consistency, odor) none Periwound: intact Dressing procedure/placement/frequency:   I modified the wound care orders for the knees to have the hydrogel to the wounds, then topped with Xeroform gauze, then kerlex.  This should take care of the inadvertent skin removal that could occur if the dry dressings are not adequately moistened prior to removal. Monitor the wound area(s) for worsening of condition such as: Signs/symptoms of infection,  Increase in size,  Development of or worsening of odor, Development of pain, or increased pain at the affected locations.  Notify the medical team if any of these develop.  Thank you for the consult.  Discussed plan of care with the patient and bedside nurse.  [place] nurse will not follow at this time.  Please re-consult the [place] team if needed.  Val Riles, RN, MSN, CWOCN, CNS-BC, pager [phone]

## 2019-12-20 NOTE — Progress Notes (Signed)
NAME:  [name] [name], MRN:  [id number], DOB:  1966-11-05, LOS: 16 ADMISSION DATE:  11/16/2019, CONSULTATION DATE:  12/2 REFERRING MD:  Dr. Sedonia Small, CHIEF COMPLAINT:  Post cardiac arrest    Brief History   54yo male presented post PEA arrest which occurred during iHD treatment as outpatient. This was a witnessed arrest with quick bystander CPR. Received 3 rounds of epi, 1g of calcium and 50 mEq of bicarb with ROSC after 15 minutes.   Emergent cricothyrotomy performed by EMS due to failed intubation with [facility] airway.   Past Medical History  NSVT 2017 Nonischemic cardiomyopathy Medical noncompliance Polysubstance abuse Hypertension End-stage renal disease Combined systolic and diastolic congestive heart failure Cocaine abuse Alcohol abuse Prolonged QTC  Significant Hospital Events   12/2 Admitted post cardiac arrest, emergent cric, 33 C TTM 12/8 V. tach arrest 12/10 Cric changed to tracheostomy 12/16 HD, tolerated only 30 minutes of trach collar 12/18 - He is critically ill, remains on vent. Soft blood pressure overnight but off pressors Afebrile. Rx HD 12/19 -  MAP > 65 with slow SBP. Doing PSV via trach. EP wants amio gtt cntinued through 12/21 and then decide on Dc. RN concerned about bilsters on feet and knees. She has called wound care. Not on sedation gtt. Following commands/. No active bleeding. Diarrhea + per RN 12/20: VVS recommends continued monitoring of LE and allowed necrosis to demarcate. Patient reportedly opposed to idea of amputaiton. Failed ATC and back on full vent. Amio gtt cotinues.  Afebrile.  Renam planning repeat HD v CRRT with pressor support 12/10/19 based on BP. Denies complaints other than LE pain. On TF 12/06/2019 complains of any pain with manipulation of lower extremities.  Currently on full mechanical ventilatory support.  No acute distress 12/23 did not tolerate wean well 12/24 Not tolerating wean, no events overnight 12/09/2019 -called by RN for  increased anxiety when attempted to come off vent on ATC. Pt with increased wob on atc, getting dialysis at this time. 12/27-12/29 Failed PS 12/30 - Remains volume overloaded. Place vas cath for CRRT  1/2: CRRT stopped after filter clotted.  Borderline blood pressure, appears euvolemic.  More hypotensive today. -3.6 L,  (net neg since 12/29, -6.4 L total for hosp)   1/3 remains on low dose levophed today.   Consults:  Nephrology Electrophysiology ENT [place] 12/18   Procedures:  12/10-trach 12/4 RT fem HD cath >> out 12/2 RT [place] CVL >> 12/30 trialysis  Significant Diagnostic Tests:  CTA 12/2-segmental filling defects in the right middle lobe and right lower lobe pulmonary arteries, pneumomediastinum, subcu emphysema CT head 12/8-no acute abnormality  Echocardiogram 11/17/19-LVEF less than 20%, normal LV systolic function  Micro Data:  COVID 12/2 >> negative Aspirate 12/04 >> + for Haemophilus influenza  12/10 MRSA Nasal swab >> Positive 12/11 [place] >> negative 12/11 Respiratory CX >> Staph Epidermidis 12/31 resp culture >>  Antimicrobials:  Unasyn 12/4>>12/7 Ceftriazone 12/7>>12/13  Interim history/subjective:  No events overnight No new complaints Did not tolerate weaning this AM  Objective   Blood pressure (!) 113/49, pulse 79, temperature 98.5 F (36.9 C), temperature source Oral, resp. rate 20, height 5' 10.5" (1.791 m), weight 68.2 kg, SpO2 100 %.    Vent Mode: PRVC FiO2 (%):  [40 %] 40 % Set Rate:  [20 bmp] 20 bmp Vt Set:  [470 mL] 470 mL PEEP:  [5 cmH20] 5 cmH20 Plateau Pressure:  [15 cmH20-19 cmH20] 18 cmH20   Intake/Output Summary (Last 24 hours) at 12/20/2019  8299 Last data filed at 12/20/2019 0600 Gross per 24 hour  Intake 751.81 ml  Output 2420 ml  Net -1668.19 ml   Filed Weights   12/18/19 0500 12/19/19 1420 12/19/19 1655  Weight: 72.2 kg 70 kg 68.2 kg   Recent Labs    12/19/19 0432 12/20/19 0356  HGB 7.8* 7.0*   Physical Exam: General:  Chronically ill appearing, NAD HENT: [place]/AT, PERRL, EOM-I and MMM, trach in place Neck: trach in place Respiratory: Coarse diffusely Cardiovascular: RRR, Nl S1/S2 and -M/R/G GI: Soft, NT, ND and +BS Extremities: -edema and -tenderness  Neuro: Awake and interactive Skin: no rashes, demarcation of feet from previous ischemia, dp pulses faint but palpable.   I reviewed CXR myself, trach is in a good position  Discussed with RT and RN  Resolved Hospital Problem list    Cardiogenic shock   Assessment & Plan:   S/p PEA arrest on 12/2 S/p VT/VF cardiac arrest on 12/8 with ongoing NSVT Doubt PE but contrast timing poor, venous duplex negative, prolonged QT Cause unclear - underlying CHF?  Plan - Continue PO amio for now - Not a candidate for ICD per EP on 12/29 ("Not candidate for ICD at this time with new tracheostomy, risk of infection, risk of mortality"). Per note from 12/29, [facility] would only be arranged AFTER discharge home/facility - Continue tele monitoring - May need to have them reassess for ICD if becomes more stable, weans from vent.  - Palliative following - Needs placement in a vent SNF at this point  Hypotension - Wean levophed as able for MAP of 65 - Likely 2/2 sepsis from mssa pna.   Acute on chronic respiratory failure s/p tracheostomy. Likely VAP- increasing WBC, purulent sputum, and increasing O2 requirements. Difficult intubation needing cric in field s/p exchange for tracheostomy  Actually is tolerating PSV trial for long time today, still having copious sputum output from trach.   Sputum growing staph aureus.  Plan - Narrow abtx today.  - Continue PS as able, tolerate higher RR - Volume management per HD  Ileus vs constipation - Resolved, tolerating feeds.   Combined systolic and diastolic CHF EF <37% Chronic atrial fibrillation Plan - Amiodarone per EP - Telemetry - Heparin infusion  - On HD for fluid management. CRRT stopped yesterday.    RML  and RLL subsegmental PE on 12/2 - Heparin infusion  ESRD; has LUE fistula Plan - Trialysis can be removed once back on HD (currently using for levophed) - Cont midodrine to 5 mg TID   MSSA Sputum: - Ancef for 1 week, stop date on 1/7  Hypoglycemia - Resolved  Ischemic changes of feet  - Due to low flow, obtain vascular input, now on IV heparin. Seen by VVS 12/18 - VVS concerned that patient at risk for amputation of lower extremity and blisters are signaling ischemia. Recommending supportive care and await demarcation. Patient not interested in amputation Plan  - Appreciate wound care & vascular's assistance - Continue anticoagulation on heparin  Anxiety Plan - PRN alprazolam  Hx polysustance abuse- cocaine, alcohol, tobacco Plan - Counseling when appropriate  Chronic Anemia, multifactorial -chronic illness, critical illness, blood loss related to frequent lab draws Plan - Con't to monitor - Transfuse for Hb <7 - Aranesp per Nephrology - Transfused 1 unit yesterday.   Hypoalbuminemia 2.7  Goals of Care - Palliative Care has been very thoughtful with [place] discussions in setting of decompensated and end-stage chronic conditions and acute/recent critical illness. At present, family  wishes for full scope of treatment. Appreciate Palliative Care continuing these discussions   Continue placing efforts  LABS   PULMONARY No results for input(s): PHART, PCO2ART, PO2ART, HCO3, TCO2, O2SAT in the last 168 hours.  Invalid input(s): PCO2, PO2  CBC Recent Labs  Lab 12/18/19 0458 12/19/19 0432 12/20/19 0356  HGB 8.1* 7.8* 7.0*  HCT 26.2* 25.2* 23.0*  WBC 16.0* 14.0* 10.5  PLT 353 324 324    COAGULATION No results for input(s): INR in the last 168 hours.  CARDIAC  No results for input(s): TROPONINI in the last 168 hours. No results for input(s): PROBNP in the last 168 hours.   CHEMISTRY Recent Labs  Lab 12/15/19 0241 12/16/19 0301 12/16/19 1643  12/17/19 0506 12/17/19 1504 12/18/19 0458 12/19/19 0446 12/20/19 0356  NA 134* 136 135 135 132* 130* 127* 130*  K 5.0 5.2* 4.6 4.3 4.4 4.3 4.8 3.9  CL 96* 97* 97* 96* 97* 94* 90* 91*  CO2 24 24 23 25 22 23  20* 24  GLUCOSE 101* 79 98 120* 128* 123* 121* 107*  BUN 47* 32* 35* 28* 37* 56* 85* 58*  CREATININE 3.36* 2.22* 2.29* 1.88* 2.28* 3.25* 4.79* 3.84*  CALCIUM 8.3* 8.9 8.7* 8.9 8.8* 8.7* 8.5* 8.4*  MG 2.3 2.6*  --  2.5*  --  2.7*  --  2.5*  PHOS 3.0 2.8 2.6 2.2* 3.0 3.9  --  4.6   Estimated Creatinine Clearance: 22 mL/min (A) (by C-G formula based on SCr of 3.84 mg/dL (H)).   LIVER Recent Labs  Lab 12/17/19 0017 12/17/19 0506 12/17/19 1504 12/18/19 0458 12/19/19 0446  AST 16  --   --   --  11*  ALT 13  --   --   --  12  ALKPHOS 196*  --   --   --  153*  BILITOT 1.1  --   --   --  1.0  PROT 8.1  --   --   --  7.4  ALBUMIN 2.5* 2.7* 2.4* 2.5* 2.3*     INFECTIOUS No results for input(s): LATICACIDVEN, PROCALCITON in the last 168 hours.   ENDOCRINE CBG (last 3)  Recent Labs    12/19/19 2312 12/20/19 0355 12/20/19 0725  GLUCAP 82 106* 107*   The patient is critically ill with multiple organ systems failure and requires high complexity decision making for assessment and support, frequent evaluation and titration of therapies, application of advanced monitoring technologies and extensive interpretation of multiple databases.   Critical Care Time devoted to patient care services described in this note is  31  Minutes. This time reflects time of care of this signee Dr Jennet Maduro. This critical care time does not reflect procedure time, or teaching time or supervisory time of PA/NP/Med student/Med Resident etc but could involve care discussion time.  Rush Farmer, M.D. [facility] Pulmonary/Critical Care Medicine.

## 2019-12-20 NOTE — Progress Notes (Signed)
E Link assisted with tele visit with Case Manager [place].

## 2019-12-20 NOTE — Care Management (Addendum)
CM contacted by [facility].  TOC supervisor gave referral to liaison last week.  Facility can accept pt today.  CM spoke with attending - attending in agreement with discharge to [facility] today.  CM spoke with pt via E link, pt father and son all in agreement with pt discharging to [facility] today.    Update:  CM discussed no visitor policy of [facility] - pt informed CM that he will discuss this with his family today.  CM will follow back up with family tomorrow  Update 1600:  Pt and family in agreement with [facility] non visitor policy and  to discharge to [facility] when bed is available. Per [facility] bed is not available but will follow up with CM when one is

## 2019-12-21 DIAGNOSIS — R627 Adult failure to thrive: Secondary | ICD-10-CM

## 2019-12-21 LAB — CBC
HCT: 23.7 % — ABNORMAL LOW (ref 39.0–52.0)
Hemoglobin: 7.3 g/dL — ABNORMAL LOW (ref 13.0–17.0)
MCH: 28.6 pg (ref 26.0–34.0)
MCHC: 30.8 g/dL (ref 30.0–36.0)
MCV: 92.9 fL (ref 80.0–100.0)
Platelets: 313 10*3/uL (ref 150–400)
RBC: 2.55 MIL/uL — ABNORMAL LOW (ref 4.22–5.81)
RDW: 17.4 % — ABNORMAL HIGH (ref 11.5–15.5)
WBC: 10 10*3/uL (ref 4.0–10.5)
nRBC: 0 % (ref 0.0–0.2)

## 2019-12-21 LAB — BASIC METABOLIC PANEL
Anion gap: 16 — ABNORMAL HIGH (ref 5–15)
BUN: 89 mg/dL — ABNORMAL HIGH (ref 6–20)
CO2: 22 mmol/L (ref 22–32)
Calcium: 8.4 mg/dL — ABNORMAL LOW (ref 8.9–10.3)
Chloride: 90 mmol/L — ABNORMAL LOW (ref 98–111)
Creatinine, Ser: 5.51 mg/dL — ABNORMAL HIGH (ref 0.61–1.24)
GFR calc Af Amer: 13 mL/min — ABNORMAL LOW (ref >60)
GFR calc non Af Amer: 11 mL/min — ABNORMAL LOW (ref >60)
Glucose, Bld: 117 mg/dL — ABNORMAL HIGH (ref 70–99)
Potassium: 3.9 mmol/L (ref 3.5–5.1)
Sodium: 128 mmol/L — ABNORMAL LOW (ref 135–145)

## 2019-12-21 LAB — GLUCOSE, CAPILLARY
Glucose-Capillary: 109 mg/dL — ABNORMAL HIGH (ref 70–99)
Glucose-Capillary: 120 mg/dL — ABNORMAL HIGH (ref 70–99)
Glucose-Capillary: 115 mg/dL — ABNORMAL HIGH (ref 70–99)
Glucose-Capillary: 111 mg/dL — ABNORMAL HIGH (ref 70–99)

## 2019-12-21 LAB — MAGNESIUM: Magnesium: 2.6 mg/dL — ABNORMAL HIGH (ref 1.7–2.4)

## 2019-12-21 MED ORDER — LIDOCAINE-PRILOCAINE 2.5-2.5 % EX CREA
1.0000 "application " | TOPICAL_CREAM | CUTANEOUS | Status: DC | PRN
  Filled 2019-12-21: qty 5

## 2019-12-21 MED ORDER — NOREPINEPHRINE 4 MG/250ML-% IV SOLN
0.0000 ug/min | INTRAVENOUS | Status: DC
  Administered 2019-12-21: 2 ug/min via INTRAVENOUS
  Administered 2019-12-21: 12 ug/min via INTRAVENOUS
  Administered 2019-12-22: 2 ug/min via INTRAVENOUS
  Administered 2019-12-23: 3 ug/min via INTRAVENOUS
  Filled 2019-12-21 (×4): qty 250

## 2019-12-21 MED ORDER — LIDOCAINE HCL (PF) 1 % IJ SOLN: 5.0000 mL | INTRAMUSCULAR | Status: DC | PRN

## 2019-12-21 MED ORDER — JUVEN PO PACK
1.0000 | PACK | Freq: Two times a day (BID) | ORAL | Status: DC
  Administered 2019-12-21 – 2019-12-23 (×6): 1
  Filled 2019-12-21 (×7): qty 1

## 2019-12-21 MED ORDER — SODIUM CHLORIDE 0.9 % IV SOLN: 100.0000 mL | INTRAVENOUS | Status: DC | PRN

## 2019-12-21 MED ORDER — DARBEPOETIN ALFA 100 MCG/0.5ML IJ SOSY
100.0000 ug | PREFILLED_SYRINGE | Freq: Once | INTRAMUSCULAR | Status: AC
  Administered 2019-12-21: 100 ug via SUBCUTANEOUS
  Filled 2019-12-21: qty 0.5

## 2019-12-21 MED ORDER — HEPARIN SODIUM (PORCINE) 1000 UNIT/ML DIALYSIS: 1000.0000 [IU] | INTRAMUSCULAR | Status: DC | PRN

## 2019-12-21 MED ORDER — ALTEPLASE 2 MG IJ SOLR
2.0000 mg | Freq: Once | INTRAMUSCULAR | Status: DC | PRN
  Filled 2019-12-21: qty 2

## 2019-12-21 MED ORDER — PENTAFLUOROPROP-TETRAFLUOROETH EX AERO: 1.0000 "application " | INHALATION_SPRAY | CUTANEOUS | Status: DC | PRN

## 2019-12-21 MED ORDER — CLONAZEPAM 0.5 MG PO TBDP
0.5000 mg | ORAL_TABLET | Freq: Once | ORAL | Status: AC
  Administered 2019-12-22: 01:00:00 0.5 mg via ORAL
  Filled 2019-12-21: qty 1

## 2019-12-21 NOTE — Progress Notes (Signed)
Nutrition Follow-up  DOCUMENTATION CODES:   Not applicable  INTERVENTION:    Change Vital 1.5 goal rate to 45 ml/hr   Increase Pro-Stat to 30 mL 5 times per day   Provides 2120 kcals, 148 g of protein and 825 mL of free water   Continue Rena-Vit    Add Juven BID via tube, each packet provides 80 calories, 8 grams of carbohydrate, 2.5  grams of protein (collagen), 7 grams of L-arginine and 7 grams of L-glutamine; supplement contains CaHMB, Vitamins C, E, B12 and Zinc to promote wound healing  NUTRITION DIAGNOSIS:   Inadequate oral intake related to acute illness as evidenced by NPO status.  Ongoing   GOAL:   Patient will meet greater than or equal to 90% of their needs  Met with TF  MONITOR:   Vent status, Labs, Weight trends, TF tolerance  ASSESSMENT:   54 yo male admitted post cardiac arrest at [facility], required emergent cricothyrotomy in the field. PMH includes ESRD on HD, HTN, CHF, cocaine and EtOH abuse, medical nonadherence  12/02 Admit post Cardiac Arrest, TTM 33 degrees 12/04 Gastric Cortrak placed, CRRT initiated 12/08 Arrested, CRRT to resume 12/10 Cricthothyrotomy changed to [place] 12/11 KUB with ileus pattern, CRRT d/c 12/13 Abd xray with improved ileus 12/15 Cortrak advanced to post-pyloric position  CRRT off. S/P second iHD today. Plans for d/c to [facility] when bed available.   Vascular Surgery following for ischemic changes to BLE due to prolonged use of vasopressors at time of PEA arrest. Feet continue to demarcate.  Patient remains on ventilator support via trach MV: 8.6 L/min Temp (24hrs), Avg:98.4 F (36.9 C), Min:98.3 F (36.8 C), Max:98.6 F (37 C)  Propofol: none  Tolerating Vital 1.5 TF via Cortrak (post pyloric) at 45 ml/h with Pro-stat 30 ml 5 times per day to provide 2120 kcal, 148 gm protein, 825 ml free water daily.  Labs reviewed. Sodium 128 (L), BUN 89 (H), creatinine 5.51 (H) CBG's: 109-120 Medications reviewed and  include colace, rena-vit, senokot-s, levophed.  I/O +1.6 L since admission 12/20/18 71 kg  12/13/19 80.4 kg  12/01/19 74.3 kg  Diet Order:   Diet Order    None      EDUCATION NEEDS:   Not appropriate for education at this time  Skin:  Skin Assessment: Skin Integrity Issues: Skin Integrity Issues:: Stage II Stage II: sacrum  Last BM:  12/30 type 7  Height:   Ht Readings from Last 1 Encounters:  12/17/19 5' 10.5" (1.791 m)    Weight:   Wt Readings from Last 1 Encounters:  12/21/19 71 kg    BMI:  Body mass index is 22.14 kg/m.  Estimated Nutritional Needs:   Kcal:  2118 kcals  Protein:  120-160 g  Fluid:  1000 mL plus UOP   Molli Barrows, RD, LDN, [place] Pager [phone] After Hours Pager [phone]

## 2019-12-21 NOTE — Progress Notes (Addendum)
[facility] Progress Note Patient Name: [name] [name] DOB: 1966/04/23 MRN: [id number]   Date of Service  12/21/2019  HPI/Events of Note  Hypotension - BP = 64/48 with MAP = 54.   eICU Interventions  Will order: 1. Norepinephrine IV infusion. Titrate to MAP > 65.  2. Send AM CBC now.      Intervention Category Major Interventions: Hypotension - evaluation and management  Viggo Perko Eugene 12/21/2019, 4:06 AM

## 2019-12-21 NOTE — Progress Notes (Addendum)
  Progress Note    12/21/2019 11:56 AM Hospital Day 35  Subjective:  No complaints; awakes easily  Afebrile x 24 hrs  Vitals:   12/21/19 1123 12/21/19 1149  BP:    Pulse: 85   Resp: 20   Temp:  98.3 F (36.8 C)  SpO2: 95%     Physical Exam: Cardiac:  regular Lungs:  Non labored Extremities:  Palpable DP pulses bilaterally  Left foot   Left lateral     Left medial    Right foot    Right lateral     Right medial    Bilateral knees     CBC    Component Value Date/Time   WBC 10.0 12/21/2019 0418   RBC 2.55 (L) 12/21/2019 0418   HGB 7.3 (L) 12/21/2019 0418   HGB 12.3 (L) 12/31/2017 1012   HCT 23.7 (L) 12/21/2019 0418   HCT 37.4 (L) 12/31/2017 1012   PLT 313 12/21/2019 0418   PLT 176 12/31/2017 1012   MCV 92.9 12/21/2019 0418   MCV 99 (H) 12/31/2017 1012   MCH 28.6 12/21/2019 0418   MCHC 30.8 12/21/2019 0418   RDW 17.4 (H) 12/21/2019 0418   RDW 18.8 (H) 12/31/2017 1012   LYMPHSABS 1.1 12/17/2019 1504   LYMPHSABS 1.0 12/31/2017 1012   MONOABS 1.7 (H) 12/17/2019 1504   EOSABS 0.5 12/17/2019 1504   EOSABS 0.1 12/31/2017 1012   BASOSABS 0.1 12/17/2019 1504   BASOSABS 0.0 12/31/2017 1012    BMET    Component Value Date/Time   NA 128 (L) 12/21/2019 0418   K 3.9 12/21/2019 0418   CL 90 (L) 12/21/2019 0418   CO2 22 12/21/2019 0418   GLUCOSE 117 (H) 12/21/2019 0418   BUN 89 (H) 12/21/2019 0418   CREATININE 5.51 (H) 12/21/2019 0418   CALCIUM 8.4 (L) 12/21/2019 0418   CALCIUM 8.2 (L) 10/21/2010 1420   GFRNONAA 11 (L) 12/21/2019 0418   GFRAA 13 (L) 12/21/2019 0418    INR    Component Value Date/Time   INR 1.6 (H) 11/16/2019 2005     Intake/Output Summary (Last 24 hours) at 12/21/2019 1156 Last data filed at 12/21/2019 1100 Gross per 24 hour  Intake 1002.59 ml  Output 175 ml  Net 827.59 ml     Plan:  54 y.o. male who presented with cardiac arrest and required pressors for extended time now with ischemic changes to [facility]  Day 35  -pt with palpable DP pulses bilaterally -feet continue to demarcate-continue to protect feet and current dressings to wounds with xeroform, ABD and kerlix -pt accepted to [facility] but no beds available at this time.  Will continue to follow while in [facility]. -Dr. Trula Slade to see pt this afternoon.   Leontine Locket, PA-C [facility] [phone] 12/21/2019 11:56 AM  I agree with the above.  No acute indication for amputation.  Continue to leg leggs demarcate  [facility]

## 2019-12-21 NOTE — Progress Notes (Signed)
[facility] NEPHROLOGY PROGRESS NOTE  Assessment/ Plan: Pt is a 54 y.o. yo male ESRD on HD, status post cardiac arrest and cardiogenic shock.  HD OP: [place] MWF, 4h 24mn 74.5kg 2/2.5 bath 340/A1.5 P4 Hep 5000  L AVF, C3 0.75 qTx, no ESA/Fe  #PEA cardiac arrest/pulmonary edema and cardiogenic shock: On Eliquis and amiodarone.  Not a candidate for ICD with a tracheostomy in place.  # ESRD: MWF.  Required CRRT from 12/30 to 12/17/2019 for fluid overload.   He tolerated IHD on 1/4.  Currently on Levophed for hypotension.  The blood pressure systolic was around 1902at bedside.  Continue midodrine.  We will try intermittent hemodialysis with pressor support.  If he does not tolerate IHD then we will need to restart CRRT.  Discussed with ICU nurse.  # Anemia: Continue Aranesp.  Monitor hemoglobin, transfuse if hemoglobin less than 7.  Currently on antibiotics.  # Secondary hyperparathyroidism: Phosphorus level acceptable.  # Hypotension/volume: BP acceptable on Levophed.  #Hyponatremia, hypervolemic: Manage with dialysis.  Hopefully we can take off some fluid.  #Acute respiratory failure: Has tracheostomy.  On vent with 40% FiO2.  Subjective: Seen and examined in ICU.  Overnight event noted.  Started Levophed for hypotension.  Remains on vent.  He is alert.   Objective Vital signs in last 24 hours: Vitals:   12/21/19 0715 12/21/19 0721 12/21/19 0730 12/21/19 0745  BP: 92/67  110/69 97/64  Pulse: 74  (!) 102 94  Resp: (!) 24  (!) 23 (!) 21  Temp:  98.4 F (36.9 C)    TempSrc:  Oral    SpO2: 95%  (!) 88% (!) 88%  Weight:      Height:       Weight change:   Intake/Output Summary (Last 24 hours) at 12/21/2019 0837 Last data filed at 12/21/2019 0600 Gross per 24 hour  Intake 970 ml  Output 175 ml  Net 795 ml       Labs: Basic Metabolic Panel: Recent Labs  Lab 12/17/19 1504 12/18/19 0458 12/19/19 0446 12/20/19 0356 12/21/19 0418  NA 132* 130* 127*  130* 128*  K 4.4 4.3 4.8 3.9 3.9  CL 97* 94* 90* 91* 90*  CO2 22 23 20* 24 22  GLUCOSE 128* 123* 121* 107* 117*  BUN 37* 56* 85* 58* 89*  CREATININE 2.28* 3.25* 4.79* 3.84* 5.51*  CALCIUM 8.8* 8.7* 8.5* 8.4* 8.4*  PHOS 3.0 3.9  --  4.6  --    Liver Function Tests: Recent Labs  Lab 12/17/19 0017 12/17/19 1504 12/18/19 0458 12/19/19 0446  AST 16  --   --  11*  ALT 13  --   --  12  ALKPHOS 196*  --   --  153*  BILITOT 1.1  --   --  1.0  PROT 8.1  --   --  7.4  ALBUMIN 2.5* 2.4* 2.5* 2.3*   No results for input(s): LIPASE, AMYLASE in the last 168 hours. No results for input(s): AMMONIA in the last 168 hours. CBC: Recent Labs  Lab 12/17/19 1504 12/18/19 0458 12/19/19 0432 12/20/19 0356 12/21/19 0418  WBC 14.3* 16.0* 14.0* 10.5 10.0  NEUTROABS 10.6*  --   --   --   --   HGB 7.3* 8.1* 7.8* 7.0* 7.3*  HCT 24.5* 26.2* 25.2* 23.0* 23.7*  MCV 92.8 92.6 91.0 92.4 92.9  PLT 343 353 324 324 313   Cardiac Enzymes: No results for input(s): CKTOTAL, CKMB, CKMBINDEX, TROPONINI in  the last 168 hours. CBG: Recent Labs  Lab 12/20/19 1136 12/20/19 1546 12/20/19 1934 12/20/19 2345 12/21/19 0719  GLUCAP 116* 107* 108* 109* 109*    Iron Studies: No results for input(s): IRON, TIBC, TRANSFERRIN, FERRITIN in the last 72 hours. Studies/Results: No results found.  Medications: Infusions: . sodium chloride 10 mL/hr at 12/20/19 0349  .  ceFAZolin (ANCEF) IV 1 g (12/20/19 1735)  . feeding supplement (VITAL 1.5 CAL) 1,000 mL (12/21/19 0746)  . norepinephrine (LEVOPHED) Adult infusion 4 mcg/min (12/21/19 0600)    Scheduled Medications: . sodium chloride   Intravenous Once  . amiodarone  400 mg Per Tube Daily  . apixaban  5 mg Per Tube BID  . chlorhexidine gluconate (MEDLINE KIT)  15 mL Mouth Rinse BID  . Chlorhexidine Gluconate Cloth  6 each Topical Q0600  . Chlorhexidine Gluconate Cloth  6 each Topical Q0600  . darbepoetin (ARANESP) injection - DIALYSIS  100 mcg Intravenous Q  Wed-HD  . docusate  100 mg Per Tube Daily  . feeding supplement (PRO-STAT SUGAR FREE 64)  30 mL Per Tube 5 X Daily  . heparin  4,000 Units Dialysis Once in dialysis  . insulin aspart  0-9 Units Subcutaneous Q4H  . mouth rinse  15 mL Mouth Rinse 10 times per day  . midodrine  5 mg Per Tube TID WC  . multivitamin  1 tablet Oral QHS  . pantoprazole sodium  40 mg Per Tube QHS  . senna-docusate  1 tablet Per Tube BID  . sodium chloride flush  10-40 mL Intracatheter Q12H  . sodium chloride flush  10-40 mL Intracatheter Q12H  . white petrolatum   Topical Daily    have reviewed scheduled and prn medications.  Physical Exam: Unchanged. General: Tracheostomy in place, lying flat, alert.  On vent Heart:RRR, s1s2 nl Lungs: Bilateral basal coarse sound, no increased work of breathing Abdomen:soft, Non-tender, non-distended Extremities:No edema Dialysis Access: Left AVF has bruit  Emiko Osorto Prasad Yochanan Eddleman 12/21/2019,8:37 AM  LOS: 35 days  Pager: [phone]

## 2019-12-21 NOTE — Progress Notes (Addendum)
NAME:  [name] [name], MRN:  [id number], DOB:  Feb 15, 1966, LOS: 64 ADMISSION DATE:  11/16/2019, CONSULTATION DATE:  12/2 REFERRING MD:  Dr. Sedonia Small, CHIEF COMPLAINT:  Post cardiac arrest    Brief History   54yo male presented post PEA arrest which occurred during iHD treatment as outpatient. This was a witnessed arrest with quick bystander CPR. Received 3 rounds of epi, 1g of calcium and 50 mEq of bicarb with ROSC after 15 minutes.   Emergent cricothyrotomy performed by EMS due to failed intubation with [facility] airway.   Past Medical History  NSVT 2017 Nonischemic cardiomyopathy Medical noncompliance Polysubstance abuse Hypertension End-stage renal disease Combined systolic and diastolic congestive heart failure Cocaine abuse Alcohol abuse Prolonged QTC  Significant Hospital Events   12/2 Admitted post cardiac arrest, emergent cric, 33 C TTM 12/8 V. tach arrest 12/10 Cric changed to tracheostomy 12/16 HD, tolerated only 30 minutes of trach collar 12/18 - He is critically ill, remains on vent. Soft blood pressure overnight but off pressors Afebrile. Rx HD 12/19 -  MAP > 65 with slow SBP. Doing PSV via trach. EP wants amio gtt cntinued through 12/21 and then decide on Dc. RN concerned about bilsters on feet and knees. She has called wound care. Not on sedation gtt. Following commands/. No active bleeding. Diarrhea + per RN 12/20: VVS recommends continued monitoring of LE and allowed necrosis to demarcate. Patient reportedly opposed to idea of amputaiton. Failed ATC and back on full vent. Amio gtt cotinues.  Afebrile.  Renam planning repeat HD v CRRT with pressor support 12/10/19 based on BP. Denies complaints other than LE pain. On TF 12/06/2019 complains of any pain with manipulation of lower extremities.  Currently on full mechanical ventilatory support.  No acute distress 12/23 did not tolerate wean well 12/24 Not tolerating wean, no events overnight 12/09/2019 -called by RN for  increased anxiety when attempted to come off vent on ATC. Pt with increased wob on atc, getting dialysis at this time. 12/27-12/29 Failed PS 12/30 - Remains volume overloaded. Place vas cath for [facility] 1/2: CRRT stopped after filter clotted.  Borderline blood pressure, appears euvolemic.  More hypotensive today. -3.6 L,  (net neg since 12/29, -6.4 L total for hosp) 1/3 remains on low dose levophed today.  1/6 patient has anticipated discharge to [place]:  Nephrology Electrophysiology ENT [place] 12/18   Procedures:  12/10-trach 12/4 RT fem HD cath >> out 12/2 RT [place] CVL >> 12/30 trialysis  Significant Diagnostic Tests:  CTA 12/2-segmental filling defects in the right middle lobe and right lower lobe pulmonary arteries, pneumomediastinum, subcu emphysema CT head 12/8-no acute abnormality  Echocardiogram 11/17/19-LVEF less than 20%, normal LV systolic function  Micro Data:  COVID 12/2 >> negative Aspirate 12/04 >> + for Haemophilus influenza  12/10 MRSA Nasal swab >> Positive 12/11 [place] >> negative 12/11 Respiratory CX >> Staph Epidermidis 12/31 resp culture >> Pan sensitive staph aureus   Antimicrobials:  Unasyn 12/4>>12/7 Ceftriazone 12/7>>12/13 Cefazolin 01/03>>  Interim history/subjective:  Patient weaning today. Lying in bed, denied acute complaints other than wishing for a drink.   Objective   Blood pressure 97/64, pulse 94, temperature 98.4 F (36.9 C), temperature source Oral, resp. rate (!) 21, height 5' 10.5" (1.791 m), weight 68.2 kg, SpO2 (!) 88 %.    Vent Mode: CPAP;PSV FiO2 (%):  [30 %] 30 % Set Rate:  [20 bmp] 20 bmp Vt Set:  [470 mL] 470 mL PEEP:  [5 cmH20] 5 cmH20  Pressure Support:  [8 cmH20-10 cmH20] 10 cmH20 Plateau Pressure:  [19 cmH20-21 cmH20] 19 cmH20   Intake/Output Summary (Last 24 hours) at 12/21/2019 0843 Last data filed at 12/21/2019 0600 Gross per 24 hour  Intake 970 ml  Output 175 ml  Net 795 ml   Filed Weights   12/18/19 0500  12/19/19 1420 12/19/19 1655  Weight: 72.2 kg 70 kg 68.2 kg   Recent Labs    12/20/19 0356 12/21/19 0418  HGB 7.0* 7.3*   Physical Exam: General: A/O x4, in no acute distress, afebrile, nondiaphoretic HEENT: PEERL, EMO intact Cardio: RRR, no mrg's  Pulmonary: CTA bilaterally, no wheezing or crackles  Abdomen: Bowel sounds normal, soft, nontender  MSK: BLE nontender, nonedematous Neuro: Alert, responds well to command, denied pain, nodes that he understands his condition Psych: Appropriate affect, not depressed in appearance, engages well  Resolved Hospital Problem list    Cardiogenic shock   Assessment & Plan:   S/p PEA arrest on 12/2 S/p VT/VF cardiac arrest on 12/8 with ongoing NSVT Doubt PE but contrast timing poor, venous duplex negative, prolonged QT Cause unclear - underlying CHF? Vasovagal during HD? Underlying arrhythmia possible?  Plan - Continue PO amio - Not a candidate for ICD per EP on 12/29 ("Not candidate for ICD at this time with new tracheostomy, risk of infection, risk of mortality"). Per note from 12/29, [facility] would only be arranged AFTER discharge home/facility - Continue tele monitoring - May need to have them reassess for ICD if becomes more stable, weans from vent.  - Palliative following - Needs placement in a vent SNF at this point  Hypotension - Wean levophed as able for MAP of 65. Was restarted overnight due to decreasing MAP. Is to have HD today if BP maintains.  - Likely 2/2 sepsis from MSSA PNA.   Acute on chronic respiratory failure s/p tracheostomy. Likely VAP- increasing WBC, purulent sputum, and increasing O2 requirements. Difficult intubation needing cric in field s/p exchange for tracheostomy  Sputum growing staph aureus.  Plan - Narrowed abtx to Cefazolin for 7 days coverage  - Continue PS as able, tolerate higher RR - Volume management per HD  Ileus vs constipation - Resolved, tolerating feeds.   Combined systolic and  diastolic CHF EF <84% Chronic atrial fibrillation Plan - Amiodarone per EP - Telemetry - Heparin infusion  - On HD for fluid management. CRRT stopped yesterday.    RML and RLL subsegmental PE on 12/2 - Heparin infusion  ESRD; has LUE fistula Plan - Trialysis can be removed once back on HD (currently using for levophed) - Cont midodrine to 5 mg TID   MSSA Sputum: - Ancef for 1 week, stop date on 1/7  Hypoglycemia - Resolved  Ischemic changes of feet  - Due to low flow, obtain vascular input, now on IV heparin. Seen by VVS 12/18 - VVS concerned that patient at risk for amputation of lower extremity and blisters are signaling ischemia. Recommending supportive care and await demarcation. Patient not interested in amputation Plan  - Appreciate wound care & vascular's assistance - Continue anticoagulation on heparin  Anxiety Plan - PRN alprazolam - Consider adding and SSRI given the anti anxiety benefit that is noted within 3-4 days typically   Hx polysustance abuse- cocaine, alcohol, tobacco Plan - Counseling when appropriate  Chronic Anemia, multifactorial -chronic illness, critical illness, blood loss related to frequent lab draws Plan - Con't to monitor - Transfuse for Hb <7 - Aranesp per Nephrology - Transfused  1 unit yesterday.   Hypoalbuminemia 2.7  Goals of Care - Palliative Care has been very thoughtful with [place] discussions in setting of decompensated and end-stage chronic conditions and acute/recent critical illness. At present, family wishes for full scope of treatment. Appreciate Palliative Care continuing these discussions   Continue placing efforts  LABS   PULMONARY No results for input(s): PHART, PCO2ART, PO2ART, HCO3, TCO2, O2SAT in the last 168 hours.  Invalid input(s): PCO2, PO2  CBC Recent Labs  Lab 12/19/19 0432 12/20/19 0356 12/21/19 0418  HGB 7.8* 7.0* 7.3*  HCT 25.2* 23.0* 23.7*  WBC 14.0* 10.5 10.0  PLT 324 324 313     COAGULATION No results for input(s): INR in the last 168 hours.  CARDIAC  No results for input(s): TROPONINI in the last 168 hours. No results for input(s): PROBNP in the last 168 hours.   CHEMISTRY Recent Labs  Lab 12/16/19 0301 12/16/19 1643 12/17/19 0506 12/17/19 1504 12/18/19 0458 12/19/19 0446 12/20/19 0356 12/21/19 0418  NA 136 135 135 132* 130* 127* 130* 128*  K 5.2* 4.6 4.3 4.4 4.3 4.8 3.9 3.9  CL 97* 97* 96* 97* 94* 90* 91* 90*  CO2 24 23 25 22 23  20* 24 22  GLUCOSE 79 98 120* 128* 123* 121* 107* 117*  BUN 32* 35* 28* 37* 56* 85* 58* 89*  CREATININE 2.22* 2.29* 1.88* 2.28* 3.25* 4.79* 3.84* 5.51*  CALCIUM 8.9 8.7* 8.9 8.8* 8.7* 8.5* 8.4* 8.4*  MG 2.6*  --  2.5*  --  2.7*  --  2.5* 2.6*  PHOS 2.8 2.6 2.2* 3.0 3.9  --  4.6  --    Estimated Creatinine Clearance: 15.3 mL/min (A) (by C-G formula based on SCr of 5.51 mg/dL (H)).   LIVER Recent Labs  Lab 12/17/19 0017 12/17/19 0506 12/17/19 1504 12/18/19 0458 12/19/19 0446  AST 16  --   --   --  11*  ALT 13  --   --   --  12  ALKPHOS 196*  --   --   --  153*  BILITOT 1.1  --   --   --  1.0  PROT 8.1  --   --   --  7.4  ALBUMIN 2.5* 2.7* 2.4* 2.5* 2.3*     INFECTIOUS No results for input(s): LATICACIDVEN, PROCALCITON in the last 168 hours.   ENDOCRINE CBG (last 3)  Recent Labs    12/20/19 1934 12/20/19 2345 12/21/19 0719  GLUCAP [place], MD [facility] Internal Medicine, PGY-3 Pager # [phone]  Please see Attending A/P and/or Addendum for final recommendations.  Attending Note:  54 year old male with PEA arrest and anoxic injury who presents to PCCM with respiratory failure s/p tracheostomy.  Overnight, no events, continues to be very anxious with weaning.  On exam, clear lung with trach in place, tolerating PS this AM.  I reviewed CXR myself, trach is in a good position.  Discussed with PCCM-NP.  Will maintain PS trials, attempt to [facility] today.  HD per renal.   Hold sedating medications.  PCCM will continue to manage.  The patient is critically ill with multiple organ systems failure and requires high complexity decision making for assessment and support, frequent evaluation and titration of therapies, application of advanced monitoring technologies and extensive interpretation of multiple databases.   Critical Care Time devoted to patient care services described in this note is  32  Minutes. This time reflects time of care of  this signee Dr Jennet Maduro. This critical care time does not reflect procedure time, or teaching time or supervisory time of PA/NP/Med student/Med Resident etc but could involve care discussion time.  Rush Farmer, M.D. [facility] Pulmonary/Critical Care Medicine.

## 2019-12-21 NOTE — Progress Notes (Signed)
[place] Progress Note Patient Name: [name] [name] DOB: 09/25/1966 MRN: [id number]   Date of Service  12/21/2019  HPI/Events of Note  Anxiety - takes Klonopin 0.5 mg before HD.   eICU Interventions  Will order: 1. Klonopin 0.5 mg PO at 10 PM.      Intervention Category Major Interventions: Delirium, psychosis, severe agitation - evaluation and management  [name] 12/21/2019, 8:04 PM

## 2019-12-21 NOTE — Progress Notes (Signed)
   Patient ID: [name] [name], male   DOB: 01/18/1966, 54 y.o.   MRN: [id number]   This NP visited patient at the bedside as a follow up for palliative medicine needs and emotional support.  Patient appears weaker and thinner each time I visit.   He is awake and oriented and communicates with head nods and mouthing  Words.  He is not utilizing writing board.   He communicates that he has no questions or needs.  Acknowledged how hard this must be for him and he is emotional and tearful. Emotional support offered, quiet presence at bedside.  Again spoke to  [name] [name] and reinforced that he is the decision maker,  regarding his plan of care,  and both healthcare providers and his family will support him through this difficult time.      Ultimately his hope is to liberate him from the vent, making continuous progress for continued quality of care.  I worry that [name] [name] in on a path of continued failure to thrive.  Discussed with patient the importance of continued communication with his family and the  medical providers regarding overall plan of care and treatment options,  ensuring decisions are within the context of the patients values and GOCs.      Total time spent on the unit was 35 minutes   Greater than 50% of the time was spent in counseling and coordination of care  [name] Lessen NP  Palliative Medicine Team Team Phone # [phone] Pager [phone]  This nurse practitioner informed  the patient  that I will be out of the hospital until Monday morning.  I will follow-up at that time.

## 2019-12-22 ENCOUNTER — Inpatient Hospital Stay (HOSPITAL_COMMUNITY): Payer: Medicare Other

## 2019-12-22 DIAGNOSIS — R627 Adult failure to thrive: Secondary | ICD-10-CM

## 2019-12-22 DIAGNOSIS — Z9911 Dependence on respirator [ventilator] status: Secondary | ICD-10-CM

## 2019-12-22 LAB — CULTURE, BLOOD (ROUTINE X 2)
Culture: NO GROWTH
Culture: NO GROWTH

## 2019-12-22 LAB — BASIC METABOLIC PANEL
Anion gap: 13 (ref 5–15)
BUN: 62 mg/dL — ABNORMAL HIGH (ref 6–20)
CO2: 27 mmol/L (ref 22–32)
Calcium: 8.4 mg/dL — ABNORMAL LOW (ref 8.9–10.3)
Chloride: 95 mmol/L — ABNORMAL LOW (ref 98–111)
Creatinine, Ser: 4.02 mg/dL — ABNORMAL HIGH (ref 0.61–1.24)
GFR calc Af Amer: 19 mL/min — ABNORMAL LOW (ref >60)
GFR calc non Af Amer: 16 mL/min — ABNORMAL LOW (ref >60)
Glucose, Bld: 103 mg/dL — ABNORMAL HIGH (ref 70–99)
Potassium: 4.3 mmol/L (ref 3.5–5.1)
Sodium: 135 mmol/L (ref 135–145)

## 2019-12-22 LAB — PHOSPHORUS: Phosphorus: 4.5 mg/dL (ref 2.5–4.6)

## 2019-12-22 LAB — CBC
HCT: 22.6 % — ABNORMAL LOW (ref 39.0–52.0)
Hemoglobin: 7.1 g/dL — ABNORMAL LOW (ref 13.0–17.0)
MCH: 28.6 pg (ref 26.0–34.0)
MCHC: 31.4 g/dL (ref 30.0–36.0)
MCV: 91.1 fL (ref 80.0–100.0)
Platelets: 332 10*3/uL (ref 150–400)
RBC: 2.48 MIL/uL — ABNORMAL LOW (ref 4.22–5.81)
RDW: 17.5 % — ABNORMAL HIGH (ref 11.5–15.5)
WBC: 10.4 10*3/uL (ref 4.0–10.5)
nRBC: 0.5 % — ABNORMAL HIGH (ref 0.0–0.2)

## 2019-12-22 LAB — MAGNESIUM: Magnesium: 2.4 mg/dL (ref 1.7–2.4)

## 2019-12-22 LAB — GLUCOSE, CAPILLARY
Glucose-Capillary: 133 mg/dL — ABNORMAL HIGH (ref 70–99)
Glucose-Capillary: 110 mg/dL — ABNORMAL HIGH (ref 70–99)
Glucose-Capillary: 97 mg/dL (ref 70–99)
Glucose-Capillary: 113 mg/dL — ABNORMAL HIGH (ref 70–99)
Glucose-Capillary: 122 mg/dL — ABNORMAL HIGH (ref 70–99)
Glucose-Capillary: 127 mg/dL — ABNORMAL HIGH (ref 70–99)
Glucose-Capillary: 104 mg/dL — ABNORMAL HIGH (ref 70–99)

## 2019-12-22 MED ORDER — MIDODRINE HCL 5 MG PO TABS
10.0000 mg | ORAL_TABLET | Freq: Three times a day (TID) | ORAL | Status: DC
  Administered 2019-12-22 – 2019-12-23 (×5): 10 mg
  Filled 2019-12-22 (×5): qty 2

## 2019-12-22 MED ORDER — CLONAZEPAM 0.5 MG PO TABS: 0.5000 mg | ORAL_TABLET | Freq: Two times a day (BID) | ORAL | Status: DC | PRN

## 2019-12-22 MED ORDER — CHLORHEXIDINE GLUCONATE CLOTH 2 % EX PADS
6.0000 | MEDICATED_PAD | Freq: Every day | CUTANEOUS | Status: DC
  Administered 2019-12-23: 05:00:00 6 via TOPICAL

## 2019-12-22 MED ORDER — MIDODRINE HCL 5 MG PO TABS
7.5000 mg | ORAL_TABLET | Freq: Three times a day (TID) | ORAL | Status: DC
  Administered 2019-12-22: 7.5 mg
  Filled 2019-12-22: qty 2

## 2019-12-22 MED ORDER — CLONAZEPAM 1 MG PO TABS
1.0000 mg | ORAL_TABLET | Freq: Three times a day (TID) | ORAL | Status: DC | PRN
  Administered 2019-12-22 (×3): 1 mg
  Filled 2019-12-22 (×3): qty 1

## 2019-12-22 MED ORDER — CHLORHEXIDINE GLUCONATE CLOTH 2 % EX PADS: 6.0000 | MEDICATED_PAD | Freq: Every day | CUTANEOUS | Status: DC

## 2019-12-22 NOTE — Plan of Care (Signed)
Assessed patient's neck swelling at bedside.  This appears to not be new.  It is bilateral, nontender, no erythema, no hematoma.  Assessed under ultrasound and saw a prominent vessel, likely venous on the left side, and a conglomerate of vessels on the right side.  Soft tissue also present, likely fatty tissue.  No fluid collections, no abscesses seen, and overall no concerning findings.  Could have collateral vasculature, could be a consequence of known underlying right heart failure.

## 2019-12-22 NOTE — Progress Notes (Addendum)
NAME:  [name] [name], MRN:  [id number], DOB:  September 24, 1966, LOS: 39 ADMISSION DATE:  11/16/2019, CONSULTATION DATE:  12/2 REFERRING MD:  Dr. Sedonia Small, CHIEF COMPLAINT:  Post cardiac arrest    Brief History   54yo male presented post PEA arrest which occurred during iHD treatment as outpatient. This was a witnessed arrest with quick bystander CPR. Received 3 rounds of epi, 1g of calcium and 50 mEq of bicarb with ROSC after 15 minutes.   Emergent cricothyrotomy performed by EMS due to failed intubation with [facility] airway.   Past Medical History  NSVT 2017 Nonischemic cardiomyopathy Medical noncompliance Polysubstance abuse Hypertension End-stage renal disease Combined systolic and diastolic congestive heart failure Cocaine abuse Alcohol abuse Prolonged QTC  Significant Hospital Events   12/2 Admitted post cardiac arrest, emergent cric, 33 C TTM 12/8 V. tach arrest 12/10 Cric changed to tracheostomy 12/16 HD, tolerated only 30 minutes of trach collar 12/18 - He is critically ill, remains on vent. Soft blood pressure overnight but off pressors Afebrile. Rx HD 12/19 -  MAP > 65 with slow SBP. Doing PSV via trach. EP wants amio gtt cntinued through 12/21 and then decide on Dc. RN concerned about bilsters on feet and knees. She has called wound care. Not on sedation gtt. Following commands/. No active bleeding. Diarrhea + per RN 12/20: VVS recommends continued monitoring of LE and allowed necrosis to demarcate. Patient reportedly opposed to idea of amputaiton. Failed ATC and back on full vent. Amio gtt cotinues.  Afebrile.  Renam planning repeat HD v CRRT with pressor support 12/10/19 based on BP. Denies complaints other than LE pain. On TF 12/06/2019 complains of any pain with manipulation of lower extremities.  Currently on full mechanical ventilatory support.  No acute distress 12/23 did not tolerate wean well 12/24 Not tolerating wean, no events overnight 12/09/2019 -called by RN for  increased anxiety when attempted to come off vent on ATC. Pt with increased wob on atc, getting dialysis at this time. 12/27-12/29 Failed PS 12/30 - Remains volume overloaded. Place vas cath for [facility] 1/2: CRRT stopped after filter clotted.  Borderline blood pressure, appears euvolemic.  More hypotensive today. -3.6 L,  (net neg since 12/29, -6.4 L total for hosp) 1/3 remains on low dose levophed today.  1/6 patient has anticipated discharge to [facility]  1/7 unable to wean from vent due to anxiety  Consults:  Nephrology Electrophysiology ENT [place] 12/18   Procedures:  12/10-trach 12/4 RT fem HD cath >> out 12/2 RT [place] CVL >> 12/30 trialysis >>  Significant Diagnostic Tests:  CTA 12/2-segmental filling defects in the right middle lobe and right lower lobe pulmonary arteries, pneumomediastinum, subcu emphysema CT head 12/8-no acute abnormality  Echocardiogram 11/17/19-LVEF less than 20%, normal LV systolic function  Micro Data:  COVID 12/2 >> negative Aspirate 12/04 >> + for Haemophilus influenza  12/10 MRSA Nasal swab >> Positive 12/11 BCX >> negative 12/11 Respiratory CX >> Staph Epidermidis 12/31 resp culture >> Pan sensitive staph aureus   Antimicrobials:  Unasyn 12/4>>12/7 Ceftriazone 12/7>>12/13 Cefazolin 01/03>>  Interim history/subjective:  Patient weaning today. Lying in bed, denied acute complaints other than wishing for a drink.   Objective   Blood pressure (!) 82/68, pulse 89, temperature 99 F (37.2 C), temperature source Oral, resp. rate 20, height 5' 10.5" (1.791 m), weight 69.9 kg, SpO2 100 %.    Vent Mode: PRVC FiO2 (%):  [30 %-50 %] 50 % Set Rate:  [20 bmp] 20 bmp Vt Set:  [  470 mL-590 mL] 590 mL PEEP:  [5 cmH20-10 cmH20] 10 cmH20 Plateau Pressure:  [19 cmH20-22 cmH20] 20 cmH20   Intake/Output Summary (Last 24 hours) at 12/22/2019 0747 Last data filed at 12/22/2019 0650 Gross per 24 hour  Intake 2001.1 ml  Output 1718 ml  Net 283.1 ml   Filed Weights    12/19/19 1655 12/21/19 1200 12/21/19 1530  Weight: 68.2 kg 71 kg 69.9 kg   Recent Labs    12/21/19 0418 12/22/19 0500  HGB 7.3* 7.1*   Physical Exam: General: acute on chronically ill appearing, NAD HEENT: [place]/AT, PERRL, EOM-I and MMM, trach in place Neuro: alert and interactive, moving all ext to command Heart: RRR, Nl S1/S2 and -M/R/G Lung: Coarse BS diffusely Abdomen: Soft, NT, ND and +BS Ext: -edema and -tenderness Skin: Intact  I reviewed CXR myself, trach is in a good position  Discussed with resident  Resolved Hospital Problem list    Cardiogenic shock   Assessment & Plan:  S/p PEA arrest on 12/2 S/p VT/VF cardiac arrest on 12/8 with ongoing NSVT Doubt PE but contrast timing poor, venous duplex negative, prolonged QT Cause unclear - underlying CHF? Vasovagal during HD? Underlying arrhythmia possible?   Plan -Continue PO amio -Not a candidate for ICD per EP on 12/29 ("Not candidate for ICD at this time with new tracheostomy, risk of infection, risk of mortality"). Per note from 12/29, [facility] would only be arranged AFTER discharge home/facility -Continue tele monitoring -May need to have them reassess for ICD if becomes more stable, weans from vent.  -Palliative following -Needs placement in a vent SNF at this point  Hypotension Wean levophed as able for MAP of 65. Was restarted on 01/06 due to decreasing MAP and remains on this due to same.  Likely 2/2 sepsis from MSSA PNA.   Acute on chronic respiratory failure s/p tracheostomy. Likely VAP- increasing WBC, purulent sputum, and increasing O2 requirements. Difficult intubation needing cric in field s/p exchange for tracheostomy  Sputum growing staph aureus.  Plan -Narrowed abtx to Cefazolin for 7 days coverage  -Continue PS as able, tolerate higher RR -Volume management per HD  Atrial Fibrillation: Noted on EKG. Persistent. Likely due to structural cardiac abnormality. Plan -Continue apixaban   Ileus vs  constipation Resolved, tolerating feeds.   Combined systolic and diastolic CHF EF <76% Chronic atrial fibrillation Plan -Amiodarone per EP -Telemetry -Heparin infusion  -On HD for fluid management. CRRT stopped yesterday.    RML and RLL subsegmental PE on 12/2 Heparin infusion  ESRD; has LUE fistula Plan -Trialysis can be removed once back on HD (currently using for levophed) -Cont midodrine increased to 7.5 mg TID   MSSA Sputum: Ancef for 1 week, stop date on 1/7  Hypoglycemia Resolved  Ischemic changes of feet  Due to low flow, obtain vascular input, now on IV heparin. Seen by VVS 12/18 VVS concerned that patient at risk for amputation of lower extremity and blisters are signaling ischemia. Recommending supportive care and await demarcation. Patient not interested in amputation Plan  - Appreciate wound care & vascular's assistance - Continue anticoagulation on heparin  Anxiety Plan - PRN alprazolam - Consider adding and SSRI given the anti anxiety benefit that is noted within 3-4 days typically   Hx polysustance abuse- cocaine, alcohol, tobacco Plan - Counseling when appropriate  Chronic Anemia, multifactorial -chronic illness, critical illness, blood loss related to frequent lab draws Plan - Con't to monitor - Transfuse for Hb <7 - Aranesp per Nephrology -  Transfused 1 unit yesterday.   Hypoalbuminemia 2.7  Goals of Care - Palliative Care has been very thoughtful with [place] discussions in setting of decompensated and end-stage chronic conditions and acute/recent critical illness. At present, family wishes for full scope of treatment. Appreciate Palliative Care continuing these discussions   Continue placing efforts  LABS   PULMONARY No results for input(s): PHART, PCO2ART, PO2ART, HCO3, TCO2, O2SAT in the last 168 hours.  Invalid input(s): PCO2, PO2  CBC Recent Labs  Lab 12/20/19 0356 12/21/19 0418 12/22/19 0500  HGB 7.0* 7.3* 7.1*  HCT 23.0*  23.7* 22.6*  WBC 10.5 10.0 10.4  PLT 324 313 332    COAGULATION No results for input(s): INR in the last 168 hours.  CARDIAC  No results for input(s): TROPONINI in the last 168 hours. No results for input(s): PROBNP in the last 168 hours.   CHEMISTRY Recent Labs  Lab 12/17/19 0506 12/17/19 1504 12/18/19 0458 12/19/19 0446 12/20/19 0356 12/21/19 0418 12/22/19 0500  NA 135 132* 130* 127* 130* 128* 135  K 4.3 4.4 4.3 4.8 3.9 3.9 4.3  CL 96* 97* 94* 90* 91* 90* 95*  CO2 25 22 23  20* 24 22 27   GLUCOSE 120* 128* 123* 121* 107* 117* 103*  BUN 28* 37* 56* 85* 58* 89* 62*  CREATININE 1.88* 2.28* 3.25* 4.79* 3.84* 5.51* 4.02*  CALCIUM 8.9 8.8* 8.7* 8.5* 8.4* 8.4* 8.4*  MG 2.5*  --  2.7*  --  2.5* 2.6* 2.4  PHOS 2.2* 3.0 3.9  --  4.6  --  4.5   Estimated Creatinine Clearance: 21.5 mL/min (A) (by C-G formula based on SCr of 4.02 mg/dL (H)).   LIVER Recent Labs  Lab 12/17/19 0017 12/17/19 0506 12/17/19 1504 12/18/19 0458 12/19/19 0446  AST 16  --   --   --  11*  ALT 13  --   --   --  12  ALKPHOS 196*  --   --   --  153*  BILITOT 1.1  --   --   --  1.0  PROT 8.1  --   --   --  7.4  ALBUMIN 2.5* 2.7* 2.4* 2.5* 2.3*     INFECTIOUS No results for input(s): LATICACIDVEN, PROCALCITON in the last 168 hours.   ENDOCRINE CBG (last 3)  Recent Labs    12/21/19 1144 12/21/19 1525 12/21/19 1914  GLUCAP [place]   Kathi Ludwig, MD [facility] Internal Medicine, PGY-3 Pager # [phone]  Please see Attending A/P and/or Addendum for final recommendations.  Attending Note:  54 year old male s/p cardiac arrest who presents to PCCM with respiratory failure.  Overnight, continues to fail weaning.  On exam, coarse lungs.  I reviewed CXR myself, trach is in a good position.  Discussed with resident.  Will continue weaning efforts.  Attempt to get of levo today.  HD today.  Replace electrolytes.  BMET in AM.  PCCM will continue to manage.  The patient is  critically ill with multiple organ systems failure and requires high complexity decision making for assessment and support, frequent evaluation and titration of therapies, application of advanced monitoring technologies and extensive interpretation of multiple databases.   Critical Care Time devoted to patient care services described in this note is  31  Minutes. This time reflects time of care of this signee Dr Jennet Maduro. This critical care time does not reflect procedure time, or teaching time or supervisory time of PA/NP/Med student/Med Resident etc but  could involve care discussion time.  Rush Farmer, M.D. [facility] Pulmonary/Critical Care Medicine.

## 2019-12-22 NOTE — Progress Notes (Addendum)
[name] [name]  Assessment/ Plan: Pt is a 54 y.o. yo male ESRD on HD, status post cardiac arrest and cardiogenic shock.  HD OP: [facility] M[facility], 4h 12mn 74.5kg 2/2.5 bath 340/A1.5 P4 Hep 5000  L AVF, C3 0.75 qTx, no ESA/Fe  #PEA cardiac arrest/pulmonary edema and cardiogenic shock: On Eliquis and amiodarone.  Per charting not a candidate for ICD with a tracheostomy in place.  # ESRD: MWF.  Required CRRT from 12/30 to 12/17/2019 for fluid overload.   He tolerated IHD on 1/4.  S/p HD on 1/6 with levo. Continue midodrine.   - Continue with IHD with pressor support as needed.  If does not tolerate IHD then we will need to restart CRRT.  - short UF tx today given worsening oxygen requirement if able then plan for HD per MWF schedule  - Obtain AM labs   # Anemia: Continue Aranesp 100 mcg every Wed.  Monitor hemoglobin, transfuse if hemoglobin less than 7.   # Secondary hyperparathyroidism: Phosphorus level acceptable.  update intact PTH with length of stay as below - currently off of activated vit D  # Hypotension/volume: on levo. weight trending down over past couple of weeks    #Hyponatremia, hypervolemic: Manage with dialysis. UF with HD as tolerated  #Acute hypoxic respiratory failure: s/p tracheostomy - on vent per pulm.  Optimize volume as able.  CXR as above   Subjective:  Last HD on 1/6 with 1 kg UF. He has continued on vent in ICU. Neck swelling was evaluated per primary team.  Oxygen sats had dropped overnight - FIO2 was increased from 30 to 100 then later reduced to 60% per MD.  CXR with worsening bilateral airspace opacities.  On levo 2 mcg/min.  Review of systems: Limited by vent/trach Denies air hunger  No n/v   Objective Vital signs in last 24 hours: Vitals:   12/22/19 0010 12/22/19 0327 12/22/19 0345 12/22/19 0415  BP:      Pulse:      Resp:      Temp: 98.6 F (37 C)  99 F (37.2 C)   TempSrc: Oral  Oral   SpO2:  90%  100%   Weight:      Height:       Weight change:   Intake/Output Summary (Last 24 hours) at 12/22/2019 0536 Last data filed at 12/22/2019 0000 Gross per 24 hour  Intake 1847.91 ml  Output 1243 ml  Net 604.91 ml    Labs: Basic Metabolic Panel: Recent Labs  Lab 12/17/19 1504 12/18/19 0458 12/19/19 0446 12/20/19 0356 12/21/19 0418  NA 132* 130* 127* 130* 128*  K 4.4 4.3 4.8 3.9 3.9  CL 97* 94* 90* 91* 90*  CO2 22 23 20* 24 22  GLUCOSE 128* 123* 121* 107* 117*  BUN 37* 56* 85* 58* 89*  CREATININE 2.28* 3.25* 4.79* 3.84* 5.51*  CALCIUM 8.8* 8.7* 8.5* 8.4* 8.4*  PHOS 3.0 3.9  --  4.6  --    Liver Function Tests: Recent Labs  Lab 12/17/19 0017 12/17/19 1504 12/18/19 0458 12/19/19 0446  AST 16  --   --  11*  ALT 13  --   --  12  ALKPHOS 196*  --   --  153*  BILITOT 1.1  --   --  1.0  PROT 8.1  --   --  7.4  ALBUMIN 2.5* 2.4* 2.5* 2.3*   CBC: Recent Labs  Lab 12/17/19 1504 12/18/19 0458 12/19/19 0432 12/20/19  0356 12/21/19 0418  WBC 14.3* 16.0* 14.0* 10.5 10.0  NEUTROABS 10.6*  --   --   --   --   HGB 7.3* 8.1* 7.8* 7.0* 7.3*  HCT 24.5* 26.2* 25.2* 23.0* 23.7*  MCV 92.8 92.6 91.0 92.4 92.9  PLT 343 353 324 324 313   Cardiac Enzymes: No results for input(s): CKTOTAL, CKMB, CKMBINDEX, TROPONINI in the last 168 hours. CBG: Recent Labs  Lab 12/20/19 2345 12/21/19 0719 12/21/19 1144 12/21/19 1525 12/21/19 1914  GLUCAP 109* 109* 120* 115* 111*    Iron Studies: No results for input(s): IRON, TIBC, TRANSFERRIN, FERRITIN in the last 72 hours. Studies/Results: DG CHEST PORT 1 VIEW  Result Date: 12/22/2019 CLINICAL DATA:  53-year-old male with intubation. EXAM: PORTABLE CHEST 1 VIEW COMPARISON:  Chest radiograph dated 12/18/2019. FINDINGS: Tracheostomy with tip above the carina in similar position. A feeding tube extending below the diaphragm with tip beyond the inferior margin of the image. Right IJ central venous line in similar position. Bilateral confluent airspace  densities have worsened since the prior radiograph. There is no pneumothorax. Stable cardiomegaly. No acute osseous pathology. IMPRESSION: 1. Worsening bilateral airspace opacities. 2. Stable support apparatus. Electronically Signed   By: Arash  Radparvar M.D.   On: 12/22/2019 03:57    Medications: Infusions: . sodium chloride 10 mL/hr at 12/20/19 0349  . sodium chloride    . sodium chloride    .  ceFAZolin (ANCEF) IV Stopped (12/21/19 1831)  . feeding supplement (VITAL 1.5 CAL) 1,000 mL (12/21/19 0746)  . norepinephrine (LEVOPHED) Adult infusion 12 mcg/min (12/22/19 0000)    Scheduled Medications: . sodium chloride   Intravenous Once  . amiodarone  400 mg Per Tube Daily  . apixaban  5 mg Per Tube BID  . chlorhexidine gluconate (MEDLINE KIT)  15 mL Mouth Rinse BID  . Chlorhexidine Gluconate Cloth  6 each Topical Q0600  . Chlorhexidine Gluconate Cloth  6 each Topical Q0600  . darbepoetin (ARANESP) injection - DIALYSIS  100 mcg Intravenous Q Wed-HD  . docusate  100 mg Per Tube Daily  . feeding supplement (PRO-STAT SUGAR FREE 64)  30 mL Per Tube 5 X Daily  . heparin  4,000 Units Dialysis Once in dialysis  . insulin aspart  0-9 Units Subcutaneous Q4H  . mouth rinse  15 mL Mouth Rinse 10 times per day  . midodrine  5 mg Per Tube TID WC  . multivitamin  1 tablet Oral QHS  . nutrition supplement (JUVEN)  1 packet Per Tube BID  . pantoprazole sodium  40 mg Per Tube QHS  . senna-docusate  1 tablet Per Tube BID  . sodium chloride flush  10-40 mL Intracatheter Q12H  . sodium chloride flush  10-40 mL Intracatheter Q12H  . white petrolatum   Topical Daily    have reviewed scheduled and prn medications.  Physical Exam:  General: adult male in bed on vent in NAD HEENT Tracheostomy in place; neck swelling - bilateral Heart:RRR, s1s2 nl Lungs: coarse mechanical breath sounds; on 60% FIO2 and 10 PEEP Abdomen:soft, distended NT Extremities:trace to 1+ edema lower extremities Neuro alert and  interactive; shakes head yes/no  Dialysis Access: Left AVF has bruit and thrill   Lori C Foster 12/22/2019,5:36 AM  LOS: 36 days    

## 2019-12-22 NOTE — Progress Notes (Signed)
[facility] Progress Note Patient Name: [name] [name] DOB: 10-Nov-1966 MRN: [id number]   Date of Service  12/22/2019  HPI/Events of Note  Review of CXR reveals Tracheostomy with tip above the carina in similar position. A feeding tube extending below the diaphragm with tip beyond the inferior margin of the image. Right IJ central venous line in similar position. Bilateral confluent airspace densities have worsened since the prior radiograph. There is no pneumothorax. Stable cardiomegaly. No acute osseous pathology.  eICU Interventions  Plan: 1. Increase PEEP to 10 cm H2O.      Intervention Category Major Interventions: Other:  Haydon Kalmar Cornelia Copa 12/22/2019, 4:03 AM

## 2019-12-22 NOTE — Progress Notes (Signed)
RT Note-Patient was found on PS wean, PT here to exercise, sitting on side of bed. PS increased for support. Dialysis being started at this time, remains on wean, continue to monitor.

## 2019-12-22 NOTE — Progress Notes (Signed)
Pt. bp 84/54 with MAP 65. Pt. Levophed imitated by primary nurse Providence Lanius, RN. Dr. Royce Macadamia paged and made aware orders to start HD tx keep MAP >65 increase Levophed as needed to a max of 31mcg/min for HD purposes. Primary nurse made aware.

## 2019-12-22 NOTE — Progress Notes (Signed)
Dr Oletta Darter called to f/u on neck swelling noted by this RN and RT. Dr Oletta Darter will send md to bedside for eval.

## 2019-12-22 NOTE — Progress Notes (Signed)
[facility] Progress Note Patient Name: [name] [name] DOB: 1966-04-16 MRN: [id number]   Date of Service  12/22/2019  HPI/Events of Note  Nursing states that the patient has "new" neck swelling. Did not notice it in the beginning of the shift, however, now notice it. Nursing called last evening with similar observation. Ground team asked to evaluate the patient, felt it was old, however, did not leave a note.   eICU Interventions  Will order: 1. Portable CXR STAT. 2. Will ask ground team to evaluate at bedside. Please leave a note this time.      Intervention Category Major Interventions: Other:  Lysle Dingwall 12/22/2019, 3:36 AM

## 2019-12-22 NOTE — Progress Notes (Signed)
This rn informed by Respiratory Therapist at bedside of pt swelling both sides of neck. Both this RN and RT note soft swelling, no palpable crepitus, breath sounds clear to auscultation, oxygen sats down to 88%. This rn notes pt with frequent episodes of being anxious and  c/o of "not being able to breath". Pt maintains sats of 100% during these episodes. Redbird Smith notified of concern for neck swelling, Kathlee Nations will notify md. RT increased  fio2  to 100%.

## 2019-12-22 NOTE — Progress Notes (Signed)
Physical Therapy Treatment Patient Details Name: [name] [name] MRN: [id number] DOB: Jan 21, 1966 Today's Date: 12/22/2019    History of Present Illness 54yo male presented post PEA arrest which occurred during iHD treatment as outpatient. This was a witnessed arrest with quick bystander CPR. Emergent cricothyrotomy performed by EMS due to failed intubation with [facility] airway. 11/18/19 CRRT initiated; 12/8 v tach arrest; 12/10 crich changed to tracheostomy; 12/16 HD;  lower extremity vascular ischemia secondary to cardiac arrest and need for pressors. Vascular consulted and awaiting demarcation with likely need for amputations. PMH significant for polysubstance abuse, HTN, ESRD, CHF, nonischemic cardiomyopathy. Trach placed on 12/10.    PT Comments    Pt with slow progress. Less anxious today than last treatment.    Follow Up Recommendations  Supervision/Assistance - 24 hour;LTACH(unless able to wean from vent)     Equipment Recommendations  Other (comment)(TBD at next venue)    Recommendations for Other Services       Precautions / Restrictions Precautions Precautions: Fall Precaution Comments: ventilated, rectal tube, feeding tube, pt weaning off vent Restrictions Weight Bearing Restrictions: No    Mobility  Bed Mobility Overal bed mobility: Needs Assistance Bed Mobility: Supine to Sit;Sit to Supine     Supine to sit: Mod assist;+2 for safety/equipment;HOB elevated Sit to supine: Mod assist;+2 for physical assistance   General bed mobility comments: assist to elevate trunk into sitting and bring hips to EOB. Assist to lower trunk and bring legs back up into bed  Transfers Overall transfer level: Needs assistance Equipment used: 2 person hand held assist Transfers: Sit to/from Stand Sit to Stand: Mod assist;+2 physical assistance         General transfer comment: Assist to bring hips up and for balance. Incr time to rise. Performed x 2  Ambulation/Gait                  Stairs             Wheelchair Mobility    Modified Rankin (Stroke Patients Only)       Balance Overall balance assessment: Needs assistance Sitting-balance support: Bilateral upper extremity supported;Feet supported Sitting balance-Leahy Scale: Poor Sitting balance - Comments: Sat EOB x 8 minutes with min guard and UE support   Standing balance support: Bilateral upper extremity supported Standing balance-Leahy Scale: Poor Standing balance comment: Stood with bil hand held assist x 30sec and then x 20 sec                            Cognition Arousal/Alertness: Awake/alert Behavior During Therapy: WFL for tasks assessed/performed Overall Cognitive Status: Difficult to assess                                 General Comments: Pt follows commands and nods/shakes heads to questions. Did not remember standing with [place] the other day      Exercises      General Comments General comments (skin integrity, edema, etc.): VSS. Respiratory therapy present to manage vent settings. Pt on pressure support      Pertinent Vitals/Pain Pain Assessment: Faces Faces Pain Scale: No hurt    Home Living                      Prior Function            PT Goals (current goals can now  be found in the care plan section) Progress towards PT goals: Progressing toward goals    Frequency    Min 2X/week      PT Plan Discharge plan needs to be updated;Frequency needs to be updated    Co-evaluation              AM-PAC PT "6 Clicks" Mobility   Outcome Measure  Help needed turning from your back to your side while in a flat bed without using bedrails?: A Lot Help needed moving from lying on your back to sitting on the side of a flat bed without using bedrails?: A Lot Help needed moving to and from a bed to a chair (including a wheelchair)?: Total Help needed standing up from a chair using your arms (e.g., wheelchair or bedside chair)?:  A Lot Help needed to walk in hospital room?: Total Help needed climbing 3-5 steps with a railing? : Total 6 Click Score: 9    End of Session Equipment Utilized During Treatment: Oxygen(vent) Activity Tolerance: Patient limited by fatigue Patient left: in bed;with call bell/phone within reach;Other (comment);with nursing/sitter in room(HD present) Nurse Communication: Mobility status PT Visit Diagnosis: Unsteadiness on feet (R26.81);Other abnormalities of gait and mobility (R26.89);Muscle weakness (generalized) (M62.81)     Time: 5790-3833 PT Time Calculation (min) (ACUTE ONLY): 17 min  Charges:  $Therapeutic Activity: 8-22 mins                     Noel Pager [phone] [place] 12/22/2019, 11:20 AM

## 2019-12-23 DIAGNOSIS — Z930 Tracheostomy status: Secondary | ICD-10-CM

## 2019-12-23 LAB — BASIC METABOLIC PANEL
Anion gap: 15 (ref 5–15)
BUN: 105 mg/dL — ABNORMAL HIGH (ref 6–20)
CO2: 24 mmol/L (ref 22–32)
Calcium: 8.4 mg/dL — ABNORMAL LOW (ref 8.9–10.3)
Chloride: 92 mmol/L — ABNORMAL LOW (ref 98–111)
Creatinine, Ser: 5.61 mg/dL — ABNORMAL HIGH (ref 0.61–1.24)
GFR calc Af Amer: 12 mL/min — ABNORMAL LOW (ref >60)
GFR calc non Af Amer: 11 mL/min — ABNORMAL LOW (ref >60)
Glucose, Bld: 104 mg/dL — ABNORMAL HIGH (ref 70–99)
Potassium: 4.6 mmol/L (ref 3.5–5.1)
Sodium: 131 mmol/L — ABNORMAL LOW (ref 135–145)

## 2019-12-23 LAB — CBC
HCT: 23.1 % — ABNORMAL LOW (ref 39.0–52.0)
Hemoglobin: 7.4 g/dL — ABNORMAL LOW (ref 13.0–17.0)
MCH: 29.2 pg (ref 26.0–34.0)
MCHC: 32 g/dL (ref 30.0–36.0)
MCV: 91.3 fL (ref 80.0–100.0)
Platelets: 319 10*3/uL (ref 150–400)
RBC: 2.53 MIL/uL — ABNORMAL LOW (ref 4.22–5.81)
RDW: 17.8 % — ABNORMAL HIGH (ref 11.5–15.5)
WBC: 9.3 10*3/uL (ref 4.0–10.5)
nRBC: 0.2 % (ref 0.0–0.2)

## 2019-12-23 LAB — GLUCOSE, CAPILLARY
Glucose-Capillary: 123 mg/dL — ABNORMAL HIGH (ref 70–99)
Glucose-Capillary: 102 mg/dL — ABNORMAL HIGH (ref 70–99)
Glucose-Capillary: 118 mg/dL — ABNORMAL HIGH (ref 70–99)
Glucose-Capillary: 111 mg/dL — ABNORMAL HIGH (ref 70–99)
Glucose-Capillary: 127 mg/dL — ABNORMAL HIGH (ref 70–99)

## 2019-12-23 MED ORDER — DOCUSATE SODIUM 50 MG/5ML PO LIQD: 100.0000 mg | Freq: Every day | ORAL | 0 refills | Status: DC

## 2019-12-23 MED ORDER — QUETIAPINE FUMARATE 25 MG PO TABS: 25.0000 mg | ORAL_TABLET | Freq: Two times a day (BID) | ORAL | 0 refills | Status: DC

## 2019-12-23 MED ORDER — APIXABAN 5 MG PO TABS: 5.0000 mg | ORAL_TABLET | Freq: Two times a day (BID) | ORAL | 0 refills | Status: DC

## 2019-12-23 MED ORDER — QUETIAPINE FUMARATE 50 MG PO TABS
25.0000 mg | ORAL_TABLET | Freq: Two times a day (BID) | ORAL | Status: DC
  Administered 2019-12-23 (×2): 25 mg via ORAL
  Filled 2019-12-23 (×2): qty 1

## 2019-12-23 MED ORDER — JUVEN PO PACK: 1.0000 | PACK | Freq: Two times a day (BID) | ORAL | 0 refills | Status: DC

## 2019-12-23 MED ORDER — DARBEPOETIN ALFA 100 MCG/0.5ML IJ SOSY: 100.0000 ug | PREFILLED_SYRINGE | INTRAMUSCULAR | 0 refills | Status: DC

## 2019-12-23 MED ORDER — PANTOPRAZOLE SODIUM 40 MG PO PACK: 40.0000 mg | PACK | Freq: Every day | ORAL | 0 refills | Status: DC

## 2019-12-23 MED ORDER — PROCHLORPERAZINE MALEATE 5 MG PO TABS: 5.0000 mg | ORAL_TABLET | Freq: Four times a day (QID) | ORAL | 0 refills | Status: DC | PRN

## 2019-12-23 MED ORDER — OXYCODONE HCL 5 MG/5ML PO SOLN: 7.5000 mg | ORAL | 0 refills | Status: DC | PRN

## 2019-12-23 MED ORDER — PRO-STAT SUGAR FREE PO LIQD: 30.0000 mL | Freq: Every day | ORAL | 0 refills | Status: DC

## 2019-12-23 MED ORDER — RENA-VITE PO TABS: 1.0000 | ORAL_TABLET | Freq: Every day | ORAL | 0 refills | Status: DC

## 2019-12-23 MED ORDER — MIDODRINE HCL 10 MG PO TABS: 10.0000 mg | ORAL_TABLET | Freq: Three times a day (TID) | ORAL | 0 refills | Status: DC

## 2019-12-23 MED ORDER — AMIODARONE HCL 200 MG PO TABS: 200.0000 mg | ORAL_TABLET | Freq: Every day | ORAL | 1 refills | Status: DC

## 2019-12-23 MED ORDER — INSULIN ASPART 100 UNIT/ML ~~LOC~~ SOLN: 0.0000 [IU] | SUBCUTANEOUS | 11 refills | Status: DC

## 2019-12-23 MED ORDER — CLONAZEPAM 1 MG PO TABS: 1.0000 mg | ORAL_TABLET | Freq: Three times a day (TID) | ORAL | 0 refills | Status: DC | PRN

## 2019-12-23 MED ORDER — ACETAMINOPHEN 650 MG RE SUPP: 650.0000 mg | Freq: Four times a day (QID) | RECTAL | 0 refills | Status: DC | PRN

## 2019-12-23 MED ORDER — VITAL 1.5 CAL PO LIQD: 1000.0000 mL | ORAL | 0 refills | Status: DC

## 2019-12-23 NOTE — Progress Notes (Signed)
Attempted trach collar trials this AM with patient. Had placed on 5/5 and looked great. Initially he did not want to even try ATC, and I explained to him why It was so important to try. He agreed to try and then had extreme anxiety as soon as I took him off. Asked to be placed back on. I stayed with him for 5 min on ATC and all numbers looked great, but he did want to continue. Currently weaning on 15/5. I feel as if he would do well on a trach collar if he could relax. RT to monitor.

## 2019-12-23 NOTE — Progress Notes (Signed)
Renal Navigator notes patient plan for discharge to [facility] today and notified patient's OP HD clinic[facility] of this plan.  Alphonzo Cruise, [place] Renal Navigator [phone]

## 2019-12-23 NOTE — Progress Notes (Signed)
[facility] NEPHROLOGY PROGRESS NOTE  Assessment/ Plan: Pt is a 54 y.o. yo male ESRD on HD, status post cardiac arrest and cardiogenic shock.  HD OP: [facility] MWF, 4h 55mn 74.5kg 2/2.5 bath 340/A1.5 P4 Hep 5000  L AVF, C3 0.75 qTx, no ESA/Fe  #PEA cardiac arrest/pulmonary edema and cardiogenic shock: On Eliquis and amiodarone.  Per charting not a candidate for ICD with a tracheostomy in place.  # ESRD: MWF.  Required CRRT from 12/30 to 12/17/2019 for fluid overload.   He tolerated IHD on 1/4.  S/p HD on 1/6 with levo.    - Continue with IHD with pressor support as needed - HD today per MWF schedule; s/p UF only tx on 1/7 with 2 kg UF - AM labs are pending   # Anemia: Continue Aranesp 100 mcg every Wed.  Monitor hemoglobin, transfuse if hemoglobin less than 7.   # Secondary hyperparathyroidism: Phosphorus level acceptable.  update intact PTH with length of stay as below - currently off of activated vit D  # Hypotension/volume: on levo. weight trending down over past couple of weeks.  Note on midodrine  #Hyponatremia, hypervolemic: improved with dialysis. UF with HD as tolerated  #Acute hypoxic respiratory failure: s/p tracheostomy - on vent per pulm.  Optimize volume as able   Subjective:  Had UF only on 1/7 with 2kg UF and last full HD on 1/6 with 1 kg UF.  He thinks maybe getting a little better air but per nursing has had some thick secretions.  Has been on levo at 2 mcg/min.  Review of systems: Limited by vent/trach Denies air hunger  No n/v On tube feeds leg/foot pain which has been present for past several weeks/this admission   Objective Vital signs in last 24 hours: Vitals:   12/23/19 0400 12/23/19 0415 12/23/19 0430 12/23/19 0445  BP: (!) 81/63 (!) 72/44 (!) 84/59 (!) 89/58  Pulse: 93 90 80 88  Resp: 20 17 20 20   Temp:      TempSrc:      SpO2: 100% 99% 100% 100%  Weight:      Height:       Weight change: -0.2 kg  Intake/Output Summary  (Last 24 hours) at 12/23/2019 0532 Last data filed at 12/22/2019 1900 Gross per 24 hour  Intake 1015.18 ml  Output 2675 ml  Net -1659.82 ml    Labs: Basic Metabolic Panel: Recent Labs  Lab 12/18/19 0458 12/20/19 0356 12/21/19 0418 12/22/19 0500  NA 130* 130* 128* 135  K 4.3 3.9 3.9 4.3  CL 94* 91* 90* 95*  CO2 23 24 22 27   GLUCOSE 123* 107* 117* 103*  BUN 56* 58* 89* 62*  CREATININE 3.25* 3.84* 5.51* 4.02*  CALCIUM 8.7* 8.4* 8.4* 8.4*  PHOS 3.9 4.6  --  4.5   Liver Function Tests: Recent Labs  Lab 12/17/19 0017 12/17/19 1504 12/18/19 0458 12/19/19 0446  AST 16  --   --  11*  ALT 13  --   --  12  ALKPHOS 196*  --   --  153*  BILITOT 1.1  --   --  1.0  PROT 8.1  --   --  7.4  ALBUMIN 2.5* 2.4* 2.5* 2.3*   CBC: Recent Labs  Lab 12/17/19 1504 12/18/19 0458 12/19/19 0432 12/20/19 0356 12/21/19 0418 12/22/19 0500  WBC 14.3* 16.0* 14.0* 10.5 10.0 10.4  NEUTROABS 10.6*  --   --   --   --   --  HGB 7.3* 8.1* 7.8* 7.0* 7.3* 7.1*  HCT 24.5* 26.2* 25.2* 23.0* 23.7* 22.6*  MCV 92.8 92.6 91.0 92.4 92.9 91.1  PLT 343 353 324 324 313 332   Cardiac Enzymes: No results for input(s): CKTOTAL, CKMB, CKMBINDEX, TROPONINI in the last 168 hours. CBG: Recent Labs  Lab 12/22/19 1134 12/22/19 1506 12/22/19 1916 12/22/19 2313 12/23/19 0309  GLUCAP 113* 122* 127* 104* 123*    Iron Studies: No results for input(s): IRON, TIBC, TRANSFERRIN, FERRITIN in the last 72 hours. Studies/Results: DG CHEST PORT 1 VIEW  Result Date: 12/22/2019 CLINICAL DATA:  53 year old male with intubation. EXAM: PORTABLE CHEST 1 VIEW COMPARISON:  Chest radiograph dated 12/18/2019. FINDINGS: Tracheostomy with tip above the carina in similar position. A feeding tube extending below the diaphragm with tip beyond the inferior margin of the image. Right IJ central venous line in similar position. Bilateral confluent airspace densities have worsened since the prior radiograph. There is no pneumothorax.  Stable cardiomegaly. No acute osseous pathology. IMPRESSION: 1. Worsening bilateral airspace opacities. 2. Stable support apparatus. Electronically Signed   By: Anner Crete M.D.   On: 12/22/2019 03:57    Medications: Infusions: . sodium chloride 10 mL/hr at 12/20/19 0349  . sodium chloride    . sodium chloride    . feeding supplement (VITAL 1.5 CAL) 1,000 mL (12/21/19 0746)  . norepinephrine (LEVOPHED) Adult infusion 3 mcg/min (12/22/19 2000)    Scheduled Medications: . sodium chloride   Intravenous Once  . amiodarone  400 mg Per Tube Daily  . apixaban  5 mg Per Tube BID  . chlorhexidine gluconate (MEDLINE KIT)  15 mL Mouth Rinse BID  . Chlorhexidine Gluconate Cloth  6 each Topical Q0600  . Chlorhexidine Gluconate Cloth  6 each Topical Q0600  . darbepoetin (ARANESP) injection - DIALYSIS  100 mcg Intravenous Q Wed-HD  . docusate  100 mg Per Tube Daily  . feeding supplement (PRO-STAT SUGAR FREE 64)  30 mL Per Tube 5 X Daily  . heparin  4,000 Units Dialysis Once in dialysis  . insulin aspart  0-9 Units Subcutaneous Q4H  . mouth rinse  15 mL Mouth Rinse 10 times per day  . midodrine  10 mg Per Tube TID WC  . multivitamin  1 tablet Oral QHS  . nutrition supplement (JUVEN)  1 packet Per Tube BID  . pantoprazole sodium  40 mg Per Tube QHS  . senna-docusate  1 tablet Per Tube BID  . sodium chloride flush  10-40 mL Intracatheter Q12H  . sodium chloride flush  10-40 mL Intracatheter Q12H  . white petrolatum   Topical Daily    have reviewed scheduled and prn medications.  Physical Exam:  General: adult male in bed on vent in NAD HEENT Tracheostomy in place; neck swelling - bilateral Heart:RRR, s1s2 nl Lungs: coarse mechanical breath sounds; on 40% FIO2 and 5 PEEP Abdomen:soft, distended NT Extremities:trace edema lower extremities Neuro alert and interactive; shakes head yes/no and follows commands Dialysis Access: Left AVF has bruit and thrill   Marlane Hatcher Jerol Rufener 12/23/2019,5:32  AM  LOS: 37 days

## 2019-12-23 NOTE — Care Management Important Message (Signed)
Important Message  Patient Details  Name: [name] [name] MRN: [id number] Date of Birth: 1966-02-24   Medicare Important Message Given:  Yes     Memory Argue 12/23/2019, 9:47 AM   IM TAKEN TO PATIENT ROOM PER REQUEST  FROM NCM

## 2019-12-23 NOTE — Progress Notes (Signed)
Boykin Nearing, pts father, made aware of [facility]. He had no questions.

## 2019-12-23 NOTE — TOC Transition Note (Addendum)
Transition of Care [facility]) - CM/SW Discharge Note   Patient Details  Name: [name] [name] MRN: [id number] Date of Birth: [birth year]  [facility] CM/SW Contact:  Maryclare Labrador, RN Phone Number: 12/23/2019, 9:02 AM   Clinical Narrative:   [facility] pt today .  CM informed father, son, and daughter that pt will transfer to [facility] later today - all remain in agreement.  CM also spoke with pt via E link and informed of bed availability today.  All 3 deny questions and concerns regarding discharge to [facility] today.  [facility] request that pt receives HD today prior to transfer.  CM contacted HD unit and confirmed that pt will be dialyzed some time later today   Dad informed CM that he will come to visit pt at 10am today. Bedside nurse and attending to complete EMTALA   Final next level of care: Long Term Acute Care (LTAC) Barriers to Discharge: Barriers Resolved   Patient Goals and CMS Choice        Discharge Placement              Patient chooses bed at: Other - please specify in the comment section below:([facility]) Patient to be transferred to facility by: Bed Name of family member notified: [name] [name], [place], [place] Patient and family notified of of transfer: 12/23/19  Discharge Plan and Services                                     Social Determinants of Health (SDOH) Interventions     Readmission Risk Interventions No flowsheet data found.

## 2019-12-23 NOTE — Discharge Summary (Addendum)
Physician Discharge Summary         Patient ID: [name] [name] MRN: [id number] DOB/AGE: 1966-11-05 54 y.o.  Admit date: 11/16/2019 Discharge date: 12/23/2019  Discharge Diagnoses:   S/p cardiac arrest ESRD on HD MWF GAD  Panic disorder Ventilator dependent respiratory failure    Discharge summary   54yo male presented post PEA arrest which occurred during iHD treatment as outpatient. This was a witnessed arrest with quick bystander CPR. Received 3 rounds of epi, 1g of calcium and 50 mEq of bicarb with ROSC after 15 minutes.   Emergent cricothyrotomy performed by EMS due to failed intubation with [facility] airway. He required long term ventilation with difficulty weaning from the vent. He will need aggressive physical therapy rehabilitation and continued attempts to wean from the vent. Additionally, he has ischemia of the bilateral lower extremities and will likely require amputation per vascular surgery at some point but currently awaiting demarcation.  Discharge Plan by Active Problems   S/p PEA arrest on 12/2 S/p VT/VF cardiac arrest on 12/8 with ongoing NSVT Doubt PE but contrast timing poor, venous duplex negative, prolonged QT Cause unclear - underlying CHF? Vasovagal during HD? Underlying arrhythmia possible? Plan to continue PO amiodarone and Eliquis. Will hold off on BB and/or ACE/ARB due to hypotension. Not a candidate for ICD per EP on 12/29 ("Not candidate for ICD at this time with new tracheostomy, risk of infection, risk of mortality"). Per note from 12/29, [facility] would only be arranged AFTER discharge home/facility. May need to have them reassess for ICD if becomes more stable, weans from vent.   Hypotension Weaning off of levophed as able for MAP of 65. Was restarted on 01/06 intermittently due to decreasing MAP. Continued to require intermittent pressors to maintain BP but is asymptomatic. We increased his midodrine to 10mg  TID. Has a diagnosis of QTc which is difficult to  reassess due to Atrial fibrillation.   Acute on chronic respiratory failure s/p tracheostomy Difficult intubation needing cric in field s/p exchange for tracheostomy. Has been weaning ever so slowly due to severe anxiety with weaning attempts. He has made marked progress and is aware that his recovery is up to him.  Sputum growing staph aureus for which we narrowed abtx to Cefazolin for 5 days with the last day as of 01/07.   Ileus vs constipation Resolved, tolerating feeds.   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 Persistent. Likely due to structural cardiac abnormality. Continue apixaban and amiodarone. Holding BB and ACE/ARB due to hypotension.   RML and RLL subsegmental PE on 12/2 Continue Eliquis.   ESRD; has LUE fistula Trialysis can be removed once back on HD. Resume iHD.  Ischemic changes of feet  Due to low flow. Seen by VVS 12/18 VVS concerned that patient at risk for amputation of lower extremity and blisters are signaling ischemia. Recommending supportive care and await demarcation. Patient not interested in amputation. Appreciate wound care & vascular's assistance. Continue anticoagulation.  Anxiety Severe, worse with weaning attempts. Consider adding and SSRI given the anti anxiety benefit that is noted within 3-4 days typically   Hx polysustance abuse-  Cocaine, alcohol, tobacco. Please initiate counseling when appropriate  Significant Hospital tests/ studies  12/2 Admitted post cardiac arrest, emergent cric, 33 C TTM 12/8 V. tach arrest 12/10 Cric changed to tracheostomy 12/16 HD, tolerated only 30 minutes of trach collar 12/18 - He is critically ill, remains on vent. Soft blood pressure overnight but off pressors  Afebrile. Rx HD 12/19 -  MAP > 65 with slow SBP. Doing PSV via trach. EP wants amio gtt cntinued through 12/21 and then decide on Dc. RN concerned about bilsters on feet and knees. She has called wound care.  Not on sedation gtt. Following commands/. No active bleeding. Diarrhea + per RN 12/20: VVS recommends continued monitoring of LE and allowed necrosis to demarcate. Patient reportedly opposed to idea of amputaiton. Failed ATC and back on full vent. Amio gtt cotinues.  Afebrile.  Renam planning repeat HD v CRRT with pressor support 12/10/19 based on BP. Denies complaints other than LE pain. On TF 12/06/2019 complains of any pain with manipulation of lower extremities.  Currently on full mechanical ventilatory support.  No acute distress 12/23 did not tolerate wean well 12/24 Not tolerating wean, no events overnight 12/09/2019 -called by RN for increased anxiety when attempted to come off vent on ATC. Pt with increased wob on atc, getting dialysis at this time. 12/27-12/29 Failed PS 12/30 - Remains volume overloaded. Place vas cath for CRRT 1/2- CRRT stopped after filter clotted.  Borderline blood pressure, -3.6 L,  (net neg since 12/29, -6.4 L total for hosp) 1/2- Continued iHD per nephrology 1/3- remains on low dose levophed today.  1/6- patient has anticipated discharge to [facility]  1/7- On vent wean mode most of the day 1/8- Discharge to [facility]  Procedures   12/10-trach >> 12/4 RT fem HD cath >> out 12/2 RT [place] CVL >> 12/30 trialysis >>  Culture data/antimicrobials   COVID 12/2 >> negative Aspirate 12/04 >> + for Haemophilus influenza  12/10 MRSA Nasal swab >> Positive 12/11 [place] >> negative 12/11 Respiratory CX >> Staph Epidermidis 12/31 resp culture >> Pan sensitive staph aureus   Unasyn 12/4>>12/7 Ceftriazone 12/7>>12/13 Cefazolin 01/03>>01/07   Consults  Cardiology EP Nephrology Electrophysiology ENT [place] 12/18  Discharge Exam: BP (!) 84/65   Pulse 89   Temp 98.1 F (36.7 C) (Oral)   Resp 20   Ht 5' 10.5" (1.791 m)   Wt 151 lb 3.8 oz (68.6 kg)   SpO2 98%   BMI 21.39 kg/m   Physical Exam: General: In no acute distress, trached, afebrile, nondiaphoretic HEENT:  PEERL, EMO intact Cardio: RRR, no mrg's  Pulmonary: CTA bilaterally, no wheezing or crackles  Abdomen: Bowel sounds normal, soft, nontender  MSK: BLE with marked discoloration, skin sloughing, necrotic appearing toes but intact distal pulses Neuro: Alert, engages, oriented  Psych: Appropriate affect, anxious, depressed in appearance, engages as well as could be expected  Labs at discharge   Lab Results  Component Value Date   CREATININE 5.61 (H) 12/23/2019   BUN 105 (H) 12/23/2019   NA 131 (L) 12/23/2019   K 4.6 12/23/2019   CL 92 (L) 12/23/2019   CO2 24 12/23/2019   Lab Results  Component Value Date   WBC 9.3 12/23/2019   HGB 7.4 (L) 12/23/2019   HCT 23.1 (L) 12/23/2019   MCV 91.3 12/23/2019   PLT 319 12/23/2019   Lab Results  Component Value Date   ALT 12 12/19/2019   AST 11 (L) 12/19/2019   ALKPHOS 153 (H) 12/19/2019   BILITOT 1.0 12/19/2019   Lab Results  Component Value Date   INR 1.6 (H) 11/16/2019   INR 1.5 (H) 11/16/2019   INR 1.36 10/27/2018    Current radiological studies    DG CHEST PORT 1 VIEW  Result Date: 12/22/2019 CLINICAL DATA:  54 year old male with intubation. EXAM: PORTABLE CHEST  1 VIEW COMPARISON:  Chest radiograph dated 12/18/2019. FINDINGS: Tracheostomy with tip above the carina in similar position. A feeding tube extending below the diaphragm with tip beyond the inferior margin of the image. Right IJ central venous line in similar position. Bilateral confluent airspace densities have worsened since the prior radiograph. There is no pneumothorax. Stable cardiomegaly. No acute osseous pathology. IMPRESSION: 1. Worsening bilateral airspace opacities. 2. Stable support apparatus. Electronically Signed   By: Anner Crete M.D.   On: 12/22/2019 03:57     Disposition:    Discharge disposition: [facility] Not Defined    Guarded. Patient will need aggressive weaning trials and assistance with profound weaning induced  anxiety.  Discharge Instructions     Diet - low sodium heart healthy   Complete by: As directed    Discharge instructions   Complete by: As directed    Patient will need a life vest prior to discharge release from rehab.   Increase activity slowly   Complete by: As directed        Allergies as of 12/23/2019       Reactions   Losartan Cough        Medication List     STOP taking these medications    carvedilol 25 MG tablet Commonly known as: COREG   HYDROcodone-acetaminophen 5-325 MG tablet Commonly known as: Norco   isosorbide mononitrate 60 MG 24 hr tablet Commonly known as: IMDUR       TAKE these medications    acetaminophen 650 MG suppository Commonly known as: TYLENOL Place 1 suppository (650 mg total) rectally every 6 (six) hours as needed for fever.   amiodarone 200 MG tablet Commonly known as: PACERONE Place 1 tablet (200 mg total) into feeding tube daily. Start taking on: December 24, 2019   apixaban 5 MG Tabs tablet Commonly known as: ELIQUIS Place 1 tablet (5 mg total) into feeding tube 2 (two) times daily.   calcium acetate 667 MG capsule Commonly known as: PHOSLO Take 3 capsules (2,001 mg total) by mouth 3 (three) times daily with meals. What changed:   how much to take  when to take this  additional instructions   clonazePAM 1 MG tablet Commonly known as: KLONOPIN Place 1 tablet (1 mg total) into feeding tube 3 (three) times daily as needed (Vent weaning related anxiety). What changed:   medication strength  how much to take  how to take this  when to take this  reasons to take this   Darbepoetin Alfa 100 MCG/0.5ML Sosy injection Commonly known as: ARANESP Inject 0.5 mLs (100 mcg total) into the vein every Wednesday with hemodialysis. Start taking on: December 28, 2019   docusate 50 MG/5ML liquid Commonly known as: COLACE Place 10 mLs (100 mg total) into feeding tube daily. Start taking on: December 24, 2019   feeding  supplement (PRO-STAT SUGAR FREE 64) Liqd Place 30 mLs into feeding tube 5 (five) times daily.   feeding supplement (VITAL 1.5 CAL) Liqd Place 1,000 mLs into feeding tube continuous.   nutrition supplement (JUVEN) Pack Place 1 packet into feeding tube 2 (two) times daily.   insulin aspart 100 UNIT/ML injection Commonly known as: novoLOG Inject 0-9 Units into the skin every 4 (four) hours.   midodrine 10 MG tablet Commonly known as: PROAMATINE Place 1 tablet (10 mg total) into feeding tube 3 (three) times daily with meals.   multivitamin Tabs tablet Take 1 tablet by mouth at bedtime.   oxyCODONE 5  MG/5ML solution Commonly known as: ROXICODONE Take 7.5 mLs (7.5 mg total) by mouth every 4 (four) hours as needed for moderate pain.   pantoprazole sodium 40 mg/20 mL Pack Commonly known as: PROTONIX Place 20 mLs (40 mg total) into feeding tube at bedtime.   prochlorperazine 5 MG tablet Commonly known as: COMPAZINE Place 1 tablet (5 mg total) into feeding tube every 6 (six) hours as needed for nausea or vomiting (hiccups).   QUEtiapine 25 MG tablet Commonly known as: SEROQUEL Take 1 tablet (25 mg total) by mouth 2 (two) times daily.         Follow-up appointment   N/A Discharge Condition:    stable  Signed: Kathi Ludwig, MD [facility] Internal Medicine, PGY-3 Pager # [phone]  Please see Attending A/P and/or Addendum for final recommendations.  Attending Note:  54 year old s/p cardiac arrest, trached, weaning.  Would benefit from LTAC.   40 minutes discharge activity  Patient seen and examined, agree with above note.  I dictated the care and orders written for this patient under my direction.  Rush Farmer, M.D. [facility] Pulmonary/Critical Care Medicine.

## 2019-12-23 NOTE — Progress Notes (Signed)
Report called to [facility] and given to charge nurse, York Cerise.  All of patients belongings placed in belonging bags and transferred with patient. All questions answered from charge nurse. Patient transferred to 5E15 with 2MW charge nurse and RT at bedside. Patient alert and oriented and aware of transfer.

## 2019-12-23 NOTE — Progress Notes (Signed)
Patient will need a life vest prior to discharge from [facility] per EP cardiology.

## 2019-12-24 ENCOUNTER — Inpatient Hospital Stay
Admission: RE | Admit: 2019-12-24 | Discharge: 2020-02-03 | Disposition: A | Discharge status: Rehabilitation Facility | Payer: Medicare Other | Attending: Internal Medicine | Admitting: Internal Medicine

## 2019-12-24 ENCOUNTER — Other Ambulatory Visit (HOSPITAL_COMMUNITY): Payer: Medicare Other

## 2019-12-24 DIAGNOSIS — K567 Ileus, unspecified: Secondary | ICD-10-CM

## 2019-12-24 DIAGNOSIS — T797XXA Traumatic subcutaneous emphysema, initial encounter: Secondary | ICD-10-CM

## 2019-12-24 DIAGNOSIS — I5043 Acute on chronic combined systolic (congestive) and diastolic (congestive) heart failure: Secondary | ICD-10-CM | POA: Diagnosis present

## 2019-12-24 DIAGNOSIS — J189 Pneumonia, unspecified organism: Secondary | ICD-10-CM | POA: Diagnosis not present

## 2019-12-24 DIAGNOSIS — I4891 Unspecified atrial fibrillation: Secondary | ICD-10-CM

## 2019-12-24 DIAGNOSIS — R112 Nausea with vomiting, unspecified: Secondary | ICD-10-CM

## 2019-12-24 DIAGNOSIS — J9690 Respiratory failure, unspecified, unspecified whether with hypoxia or hypercapnia: Secondary | ICD-10-CM | POA: Diagnosis not present

## 2019-12-24 DIAGNOSIS — G9340 Encephalopathy, unspecified: Secondary | ICD-10-CM

## 2019-12-24 DIAGNOSIS — I96 Gangrene, not elsewhere classified: Secondary | ICD-10-CM

## 2019-12-24 DIAGNOSIS — Z931 Gastrostomy status: Secondary | ICD-10-CM

## 2019-12-24 DIAGNOSIS — I482 Chronic atrial fibrillation, unspecified: Secondary | ICD-10-CM | POA: Diagnosis not present

## 2019-12-24 DIAGNOSIS — N186 End stage renal disease: Secondary | ICD-10-CM

## 2019-12-24 DIAGNOSIS — R5381 Other malaise: Secondary | ICD-10-CM

## 2019-12-24 DIAGNOSIS — R188 Other ascites: Secondary | ICD-10-CM

## 2019-12-24 DIAGNOSIS — I959 Hypotension, unspecified: Secondary | ICD-10-CM

## 2019-12-24 DIAGNOSIS — Z9889 Other specified postprocedural states: Secondary | ICD-10-CM

## 2019-12-24 DIAGNOSIS — J9620 Acute and chronic respiratory failure, unspecified whether with hypoxia or hypercapnia: Secondary | ICD-10-CM | POA: Diagnosis present

## 2019-12-24 DIAGNOSIS — R140 Abdominal distension (gaseous): Secondary | ICD-10-CM

## 2019-12-24 DIAGNOSIS — J439 Emphysema, unspecified: Secondary | ICD-10-CM | POA: Diagnosis not present

## 2019-12-24 DIAGNOSIS — Z9911 Dependence on respirator [ventilator] status: Secondary | ICD-10-CM

## 2019-12-24 DIAGNOSIS — R609 Edema, unspecified: Secondary | ICD-10-CM

## 2019-12-24 DIAGNOSIS — I313 Pericardial effusion (noninflammatory): Secondary | ICD-10-CM

## 2019-12-24 DIAGNOSIS — I2699 Other pulmonary embolism without acute cor pulmonale: Secondary | ICD-10-CM | POA: Diagnosis present

## 2019-12-24 DIAGNOSIS — R1915 Other abnormal bowel sounds: Secondary | ICD-10-CM

## 2019-12-24 DIAGNOSIS — I3139 Other pericardial effusion (noninflammatory): Secondary | ICD-10-CM

## 2019-12-24 HISTORY — DX: End stage renal disease: N18.6

## 2019-12-24 HISTORY — DX: Acute and chronic respiratory failure with hypoxia: J96.21

## 2019-12-24 HISTORY — DX: Other pulmonary embolism without acute cor pulmonale: I26.99

## 2019-12-24 HISTORY — DX: End stage renal disease: Z99.2

## 2019-12-24 HISTORY — DX: Acute on chronic combined systolic (congestive) and diastolic (congestive) heart failure: I50.43

## 2019-12-24 HISTORY — DX: Chronic atrial fibrillation, unspecified: I48.20

## 2019-12-24 LAB — COMPREHENSIVE METABOLIC PANEL
ALT: 6 U/L (ref 0–44)
AST: 19 U/L (ref 15–41)
Albumin: 2.5 g/dL — ABNORMAL LOW (ref 3.5–5.0)
Alkaline Phosphatase: 249 U/L — ABNORMAL HIGH (ref 38–126)
Anion gap: 15 (ref 5–15)
BUN: 85 mg/dL — ABNORMAL HIGH (ref 6–20)
CO2: 26 mmol/L (ref 22–32)
Calcium: 8.6 mg/dL — ABNORMAL LOW (ref 8.9–10.3)
Chloride: 91 mmol/L — ABNORMAL LOW (ref 98–111)
Creatinine, Ser: 4.75 mg/dL — ABNORMAL HIGH (ref 0.61–1.24)
GFR calc Af Amer: 15 mL/min — ABNORMAL LOW (ref >60)
GFR calc non Af Amer: 13 mL/min — ABNORMAL LOW (ref >60)
Glucose, Bld: 119 mg/dL — ABNORMAL HIGH (ref 70–99)
Potassium: 4.3 mmol/L (ref 3.5–5.1)
Sodium: 132 mmol/L — ABNORMAL LOW (ref 135–145)
Total Bilirubin: 0.8 mg/dL (ref 0.3–1.2)
Total Protein: 7.6 g/dL (ref 6.5–8.1)

## 2019-12-24 LAB — CBC
HCT: 24.7 % — ABNORMAL LOW (ref 39.0–52.0)
Hemoglobin: 7.5 g/dL — ABNORMAL LOW (ref 13.0–17.0)
MCH: 28 pg (ref 26.0–34.0)
MCHC: 30.4 g/dL (ref 30.0–36.0)
MCV: 92.2 fL (ref 80.0–100.0)
Platelets: 364 10*3/uL (ref 150–400)
RBC: 2.68 MIL/uL — ABNORMAL LOW (ref 4.22–5.81)
RDW: 18.1 % — ABNORMAL HIGH (ref 11.5–15.5)
WBC: 10.2 10*3/uL (ref 4.0–10.5)
nRBC: 0.3 % — ABNORMAL HIGH (ref 0.0–0.2)

## 2019-12-24 LAB — BLOOD GAS, ARTERIAL
Acid-Base Excess: 5.7 mmol/L — ABNORMAL HIGH (ref 0.0–2.0)
Bicarbonate: 28.7 mmol/L — ABNORMAL HIGH (ref 20.0–28.0)
Drawn by: 243969
FIO2: 40
O2 Saturation: 96.2 %
Patient temperature: 36.7
pCO2 arterial: 33.8 mmHg (ref 32.0–48.0)
pH, Arterial: 7.536 — ABNORMAL HIGH (ref 7.350–7.450)
pO2, Arterial: 74 mmHg — ABNORMAL LOW (ref 83.0–108.0)

## 2019-12-24 LAB — PTH, INTACT AND CALCIUM
Calcium, Total (PTH): 8.6 mg/dL — ABNORMAL LOW (ref 8.7–10.2)
PTH: 55 pg/mL (ref 15–65)

## 2019-12-25 ENCOUNTER — Other Ambulatory Visit (HOSPITAL_COMMUNITY): Payer: Medicare Other

## 2019-12-25 ENCOUNTER — Encounter: Payer: Self-pay | Admitting: Internal Medicine

## 2019-12-25 DIAGNOSIS — N186 End stage renal disease: Secondary | ICD-10-CM | POA: Diagnosis not present

## 2019-12-25 DIAGNOSIS — J9621 Acute and chronic respiratory failure with hypoxia: Secondary | ICD-10-CM | POA: Diagnosis not present

## 2019-12-25 DIAGNOSIS — J189 Pneumonia, unspecified organism: Secondary | ICD-10-CM | POA: Diagnosis not present

## 2019-12-25 DIAGNOSIS — I959 Hypotension, unspecified: Secondary | ICD-10-CM | POA: Diagnosis not present

## 2019-12-25 DIAGNOSIS — I5043 Acute on chronic combined systolic (congestive) and diastolic (congestive) heart failure: Secondary | ICD-10-CM

## 2019-12-25 DIAGNOSIS — Z9911 Dependence on respirator [ventilator] status: Secondary | ICD-10-CM

## 2019-12-25 DIAGNOSIS — I2699 Other pulmonary embolism without acute cor pulmonale: Secondary | ICD-10-CM | POA: Diagnosis present

## 2019-12-25 DIAGNOSIS — J9620 Acute and chronic respiratory failure, unspecified whether with hypoxia or hypercapnia: Secondary | ICD-10-CM | POA: Diagnosis present

## 2019-12-25 DIAGNOSIS — Z992 Dependence on renal dialysis: Secondary | ICD-10-CM

## 2019-12-25 DIAGNOSIS — J9690 Respiratory failure, unspecified, unspecified whether with hypoxia or hypercapnia: Secondary | ICD-10-CM | POA: Diagnosis not present

## 2019-12-25 DIAGNOSIS — I482 Chronic atrial fibrillation, unspecified: Secondary | ICD-10-CM | POA: Diagnosis present

## 2019-12-25 LAB — BLOOD GAS, ARTERIAL
Acid-Base Excess: 2 mmol/L (ref 0.0–2.0)
Bicarbonate: 25.2 mmol/L (ref 20.0–28.0)
Drawn by: 243969
FIO2: 35
O2 Saturation: 99.1 %
Patient temperature: 37
pCO2 arterial: 33.8 mmHg (ref 32.0–48.0)
pH, Arterial: 7.485 — ABNORMAL HIGH (ref 7.350–7.450)
pO2, Arterial: 95.1 mmHg (ref 83.0–108.0)

## 2019-12-25 LAB — RENAL FUNCTION PANEL
Albumin: 2.4 g/dL — ABNORMAL LOW (ref 3.5–5.0)
Anion gap: 18 — ABNORMAL HIGH (ref 5–15)
BUN: 115 mg/dL — ABNORMAL HIGH (ref 6–20)
CO2: 23 mmol/L (ref 22–32)
Calcium: 8.6 mg/dL — ABNORMAL LOW (ref 8.9–10.3)
Chloride: 88 mmol/L — ABNORMAL LOW (ref 98–111)
Creatinine, Ser: 5.87 mg/dL — ABNORMAL HIGH (ref 0.61–1.24)
GFR calc Af Amer: 12 mL/min — ABNORMAL LOW (ref >60)
GFR calc non Af Amer: 10 mL/min — ABNORMAL LOW (ref >60)
Glucose, Bld: 127 mg/dL — ABNORMAL HIGH (ref 70–99)
Phosphorus: 5.1 mg/dL — ABNORMAL HIGH (ref 2.5–4.6)
Potassium: 4.7 mmol/L (ref 3.5–5.1)
Sodium: 129 mmol/L — ABNORMAL LOW (ref 135–145)

## 2019-12-25 LAB — OCCULT BLOOD X 1 CARD TO LAB, STOOL: Fecal Occult Bld: NEGATIVE

## 2019-12-25 LAB — CBC
HCT: 23.2 % — ABNORMAL LOW (ref 39.0–52.0)
Hemoglobin: 7.3 g/dL — ABNORMAL LOW (ref 13.0–17.0)
MCH: 28.4 pg (ref 26.0–34.0)
MCHC: 31.5 g/dL (ref 30.0–36.0)
MCV: 90.3 fL (ref 80.0–100.0)
Platelets: 370 10*3/uL (ref 150–400)
RBC: 2.57 MIL/uL — ABNORMAL LOW (ref 4.22–5.81)
RDW: 17.7 % — ABNORMAL HIGH (ref 11.5–15.5)
WBC: 9.9 10*3/uL (ref 4.0–10.5)
nRBC: 0.3 % — ABNORMAL HIGH (ref 0.0–0.2)

## 2019-12-25 LAB — MAGNESIUM: Magnesium: 2.3 mg/dL (ref 1.7–2.4)

## 2019-12-25 LAB — TSH: TSH: 6.933 u[IU]/mL — ABNORMAL HIGH (ref 0.350–4.500)

## 2019-12-25 LAB — HEMOGLOBIN A1C
Hgb A1c MFr Bld: 4.7 % — ABNORMAL LOW (ref 4.8–5.6)
Mean Plasma Glucose: 88.19 mg/dL

## 2019-12-25 NOTE — Consult Note (Signed)
[facility]  Date of Service: 12/25/2019  PULMONARY CRITICAL CARE [name] [name]  JKK:[id number]  DOB: 12-Feb-1966   DOA: 12/24/2019  Referring Physician: Merton Border, MD  HPI: [name] [name] is a 54 y.o. male seen for follow up of Acute on Chronic Respiratory Failure.  Patient with multiple medical problems presented to the hospital because the patient apparently was on hemodialysis and suffered a cardiac arrest.  Patient apparently became unresponsive about 3 hours into his daily dialysis routine.  Immediate CPR was started and was diagnosed with PEA.  Apparently had to have an emergent cricothyroidotomy because patient was not able to be intubated.  Patient was admitted eventually to the intensive care unit.  He had multiple issues while in the critical care unit including hypotension ileus chronic constipation along with his renal failure.  Should be noted apparently patient has a history of polysubstance abuse.  Right now patient is nonverbal.  Patient is on the ventilator on assist control mode with fairly good tidal volumes.  He had failed extubation trials and eventually ended up having to have a tracheostomy.  Now on the vent full support  Review of Systems:  ROS performed and is unremarkable other than noted above.  Past Medical History:  Diagnosis Date  . Abdominal distension 08/10/2018  . Alcohol abuse 11/26/2010   Qualifier: Diagnosis of  By: [name] MD, Pershing Proud  . Anemia due to blood loss, acute 07/15/2012  . CHF (congestive heart failure) ([place])   . Cholecystitis 04/18/2014  . Cigarette smoker    has currently quit  . Cocaine abuse ([place]) 11/26/2010   Qualifier: Diagnosis of  By: [name] MD, Benjamine Mola  has been over a year  . 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 Echo 8/13: Severe LVH, EF 40-45%, inferoposterior akinesis, grade 2 diastolic  dysfunction, moderate LAE, mild RVE, mildly reduced RVSF, mild RAE; cannot rule out R atrial mass-suggest TEE or cardiac MRI  //  B. Echo 3/17: Mild LVH, EF 25-30%, diffuse HK, grade 2 diastolic dysfunction, mild MR, severe LAE,  moderately reduced RVSF, severe RAE, mild TR, moderate PI, PASP 55 mmHg    . Dyspnea   . ESRD (end stage renal disease) on dialysis [facility])    MWF- East  (05/18/2017)  . Hemodialysis patient ([place])    M,W,F  . Hernia, inguinal, right 08/05/2016  . Hypertension   . 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 Hypocalcemia 07/15/2012  . Medical non-compliance   . Non-ischemic cardiomyopathy ([place])    A. R/L HC 3/17: Normal coronary arteries, moderate pulmonary hypertension (PASP 65 mmHg), elevated LV filling pressures (LVEDP 45 mmHg)   . NSVT (nonsustained ventricular tachycardia) ([place]) 02/22/2016  . NSVT (nonsustained ventricular tachycardia) ([place]) 02/22/2016  . Pneumonia 11/12/2017  . Polysubstance abuse ([place])   . Prolonged Q-T interval on ECG 05/18/2017  . Prolonged QT interval 05/18/2017  . Restless leg syndrome   . Solitary pulmonary nodule 10/10/2015   See cxr 10/09/2015 - CT rec 10/10/2015 >>>   . Upper airway cough syndrome 10/09/2015   Off ACEi around 1st Oct 2016  - Sinus CT 10/10/2015 >>>      Past Surgical History:  Procedure Laterality Date  . [place] TRANSPOSITION  01/19/2013   Procedure: BASCILIC VEIN TRANSPOSITION;  Surgeon: Aaron Edelman  Starlyn Skeans, MD;  Location: [facility] OR;  Service: Vascular;  Laterality: Left;  left 2nd stage basilic vein transposition  . CARDIAC CATHETERIZATION N/A 02/25/2016   Procedure: Right/Left Heart Cath and Coronary Angiography;  Surgeon: Burnell Blanks, MD;  Location: [place] CV LAB;  Service: Cardiovascular;  Laterality: N/A;  . INGUINAL HERNIA REPAIR Right 08/05/2016   Procedure: RIGHT INGUINAL HERNIA  REPAIR WITH MESH;  Surgeon: Donnie Mesa, MD;  Location: [place];  Service: General;  Laterality: Right;  . INSERTION OF DIALYSIS CATHETER  07/17/2012   Procedure: INSERTION OF DIALYSIS CATHETER;  Surgeon: Conrad Bronson, MD;  Location: [place];  Service: Vascular;  Laterality: Right;  right internal jugular  . INSERTION OF MESH Right 08/05/2016   Procedure: INSERTION OF MESH;  Surgeon: Donnie Mesa, MD;  Location: [place];  Service: General;  Laterality: Right;  . REVISION OF ARTERIOVENOUS GORETEX GRAFT Left 06/02/5092   Procedure: PLICATION OF ARTERIOVENOUS FISTULA LEFT ARM;  Surgeon: Elam Dutch, MD;  Location: [place];  Service: Vascular;  Laterality: Left;  . REVISON OF ARTERIOVENOUS FISTULA Left 01/06/2017   Procedure: REVISON OF ARTERIOVENOUS FISTULA;  Surgeon: Conrad [place] Bridge, MD;  Location: [place];  Service: Vascular;  Laterality: Left;  . REVISON OF ARTERIOVENOUS FISTULA Left 10/27/2018   Procedure: REVISION PLICATION OF ARTERIOVENOUS FISTULA  LEFT UPPER ARM;  Surgeon: Waynetta Sandy, MD;  Location: [place];  Service: Vascular;  Laterality: Left;  . TRACHEOSTOMY TUBE PLACEMENT N/A 11/24/2019   Procedure: TRACHEOSTOMY;  Surgeon: Izora Gala, MD;  Location: [place];  Service: ENT;  Laterality: N/A;  . UMBILICAL HERNIA REPAIR N/A 08/05/2016   Procedure: [place];  Surgeon: Donnie Mesa, MD;  Location: [place];  Service: General;  Laterality: N/A;  . VENOGRAM N/A 08/09/2012   Procedure: VENOGRAM;  Surgeon: Conrad Monroe, MD;  [facility] C[facility] LAB;  Service: Cardiovascular;  Laterality: N/A;    Social History:    reports that he quit smoking about 14 months ago. His smoking use included cigarettes. He quit after 30.00 years of use. He has never used smokeless tobacco. He reports current alcohol use of about 4.0 standard drinks of alcohol per week. He reports that he does not use drugs.  Family History: Non-Contributory to the present illness  Allergies  Allergen  Reactions  . Losartan Cough    Medications: Reviewed on Rounds  Physical Exam:  Vitals: Temperature 98.4 pulse 79 respiratory 26 blood pressure is 122/67 saturations 100%  Ventilator Settings mode ventilation assist control FiO2 is 35% tidal volume 509 PEEP 5  . General: Comfortable at this time . Eyes: Grossly normal lids, irises & conjunctiva . ENT: grossly tongue is normal . Neck: no obvious mass . Cardiovascular: S1-S2 normal no gallop or rub . Respiratory: No rhonchi no rales are noted at this time . Abdomen: Soft and nontender . Skin: no rash seen on limited exam . Musculoskeletal: not rigid . Psychiatric:unable to assess . Neurologic: no seizure no involuntary movements         Labs on Admission:  Basic Metabolic Panel: Recent Labs  Lab 12/20/19 0356 12/21/19 0418 12/22/19 0500 12/23/19 0323 12/24/19 0539 12/25/19 0350  NA 130* 128* 135 131* 132* 129*  K 3.9 3.9 4.3 4.6 4.3 4.7  CL 91* 90* 95* 92* 91* 88*  CO2 24 22 27 24 26 23   GLUCOSE 107* 117* 103* 104* 119* 127*  BUN 58* 89* 62* 105* 85* 115*  CREATININE 3.84*  5.51* 4.02* 5.61* 4.75* 5.87*  CALCIUM 8.4* 8.4* 8.4* 8.4*  8.6* 8.6* 8.6*  MG 2.5* 2.6* 2.4  --   --  2.3  PHOS 4.6  --  4.5  --   --  5.1*    Recent Labs  Lab 12/24/19 0145 12/25/19 0030  PHART 7.536* 7.485*  PCO2ART 33.8 33.8  PO2ART 74.0* 95.1  HCO3 28.7* 25.2  O2SAT 96.2 99.1    Liver Function Tests: Recent Labs  Lab 12/19/19 0446 12/24/19 0539 12/25/19 0350  AST 11* 19  --   ALT 12 6  --   ALKPHOS 153* 249*  --   BILITOT 1.0 0.8  --   PROT 7.4 7.6  --   ALBUMIN 2.3* 2.5* 2.4*   No results for input(s): LIPASE, AMYLASE in the last 168 hours. No results for input(s): AMMONIA in the last 168 hours.  CBC: Recent Labs  Lab 12/21/19 0418 12/22/19 0500 12/23/19 0323 12/24/19 0539 12/25/19 0350  WBC 10.0 10.4 9.3 10.2 9.9  HGB 7.3* 7.1* 7.4* 7.5* 7.3*  HCT 23.7* 22.6* 23.1* 24.7* 23.2*  MCV 92.9 91.1 91.3 92.2 90.3   PLT 313 332 319 364 370    Cardiac Enzymes: No results for input(s): CKTOTAL, CKMB, CKMBINDEX, TROPONINI in the last 168 hours.  BNP (last 3 results) Recent Labs    11/16/19 1356  BNP 962.8*    ProBNP (last 3 results) No results for input(s): PROBNP in the last 8760 hours.   Radiological Exams on Admission: DG CHEST PORT 1 VIEW  Result Date: 12/25/2019 CLINICAL DATA:  Ventilator dependence.  Pneumonia. EXAM: PORTABLE CHEST 1 VIEW COMPARISON:  12/24/2019 FINDINGS: 0820 hours. The cardio pericardial silhouette is enlarged. Tracheostomy tube remains in place. A feeding tube passes into the stomach although the distal tip position is not included on the film. Right IJ central line tip overlies the right innominate vein. Patchy bilateral airspace disease is similar to prior. No substantial pleural effusion. IMPRESSION: 1. No substantial interval change. 2. Marked enlargement cardiopericardial silhouette. 3. Patchy bilateral airspace opacities without pleural effusion. Electronically Signed   By: Misty Stanley M.D.   On: 12/25/2019 09:21   DG Chest Port 1 View  Result Date: 12/24/2019 CLINICAL DATA:  Subcutaneous emphysema. Ventilator dependence. Pneumonia. EXAM: PORTABLE CHEST 1 VIEW COMPARISON:  December 24, 2019 FINDINGS: The feeding tube terminates below today's film. The right central line terminates in the region of the right brachiocephalic vein, stable. A tracheostomy tube is stable. No pneumothorax. Bilateral pulmonary infiltrates remain, similar in the interval. Stable cardiomegaly. The hila and mediastinum are unchanged. No other acute abnormalities. IMPRESSION: 1. Support apparatus as above. 2. Bilateral pulmonary infiltrates are similar in the interval. Electronically Signed   By: Dorise Bullion III M.D   On: 12/24/2019 11:42   DG CHEST PORT 1 VIEW  Result Date: 12/24/2019 CLINICAL DATA:  54 year old male with respiratory failure. EXAM: PORTABLE ABDOMEN - 1 VIEW; PORTABLE CHEST - 1  VIEW COMPARISON:  Chest radiograph dated 12/22/2019. FINDINGS: Right IJ central venous line with tip in similar position. Tracheostomy with tip above the carina. A feeding tube with tip in the distal stomach. No significant interval change in the appearance of the lungs and cardiomediastinal silhouette. IMPRESSION: No significant interval change in the appearance of the lungs. Feeding tube with tip in the distal stomach. Electronically Signed   By: Anner Crete M.D.   On: 12/24/2019 01:55   DG CHEST PORT 1 VIEW  Result Date: 12/22/2019 CLINICAL DATA:  54 year old male with intubation. EXAM: PORTABLE CHEST 1 VIEW COMPARISON:  Chest radiograph dated 12/18/2019. FINDINGS: Tracheostomy with tip above the carina in similar position. A feeding tube extending below the diaphragm with tip beyond the inferior margin of the image. Right IJ central venous line in similar position. Bilateral confluent airspace densities have worsened since the prior radiograph. There is no pneumothorax. Stable cardiomegaly. No acute osseous pathology. IMPRESSION: 1. Worsening bilateral airspace opacities. 2. Stable support apparatus. Electronically Signed   By: Anner Crete M.D.   On: 12/22/2019 03:57   DG Abd Portable 1V  Result Date: 12/24/2019 CLINICAL DATA:  54 year old male with respiratory failure. EXAM: PORTABLE ABDOMEN - 1 VIEW; PORTABLE CHEST - 1 VIEW COMPARISON:  Chest radiograph dated 12/22/2019. FINDINGS: Right IJ central venous line with tip in similar position. Tracheostomy with tip above the carina. A feeding tube with tip in the distal stomach. No significant interval change in the appearance of the lungs and cardiomediastinal silhouette. IMPRESSION: No significant interval change in the appearance of the lungs. Feeding tube with tip in the distal stomach. Electronically Signed   By: Anner Crete M.D.   On: 12/24/2019 01:55    Assessment/Plan Active Problems:   Acute on chronic respiratory failure with  hypoxia (HCC)   End stage renal disease on dialysis (HCC)   Acute on chronic systolic and diastolic heart failure, NYHA class 4 (HCC)   Chronic atrial fibrillation ([place])   Acute pulmonary embolism without acute cor pulmonale ([place])   1. Acute on chronic respiratory failure with hypoxia patient right now is on full support on assist control mode.  Respiratory therapy will assess the RSB I mechanics and try to begin weaning if patient is able to tolerate. 2. End-stage renal disease patient is on chronic hemodialysis nephrology will be seeing the patient 2 to continue with the chronic dialysis. 3. Combined systolic diastolic heart failure patient has an extremely poor ejection fraction of less than 20% prognostically this is poor along with multisystem involvement including the renal failure 4. Chronic atrial fibrillation currently rate is controlled we will continue with supportive care 5. Pulmonary embolism diagnosed at the acute care facility patient has been on Eliquis which should be continued.  I have personally seen and evaluated the patient, evaluated laboratory and imaging results, formulated the assessment and plan and placed orders. The Patient requires high complexity decision making with multiple systems involvement.  Patient is critically ill in danger of cardiac arrest and death needs ongoing aggressive monitoring Case was discussed on Rounds with the Respiratory Therapy Director and the Respiratory staff Time Spent 23minutes  Saadat A Khan, MD [facility] Pulmonary Critical Care Medicine Sleep Medicine

## 2019-12-26 DIAGNOSIS — I959 Hypotension, unspecified: Secondary | ICD-10-CM | POA: Diagnosis not present

## 2019-12-26 DIAGNOSIS — J9600 Acute respiratory failure, unspecified whether with hypoxia or hypercapnia: Secondary | ICD-10-CM | POA: Diagnosis not present

## 2019-12-26 DIAGNOSIS — I2699 Other pulmonary embolism without acute cor pulmonale: Secondary | ICD-10-CM | POA: Diagnosis not present

## 2019-12-26 DIAGNOSIS — D631 Anemia in chronic kidney disease: Secondary | ICD-10-CM | POA: Diagnosis not present

## 2019-12-26 DIAGNOSIS — I482 Chronic atrial fibrillation, unspecified: Secondary | ICD-10-CM | POA: Diagnosis not present

## 2019-12-26 DIAGNOSIS — J9690 Respiratory failure, unspecified, unspecified whether with hypoxia or hypercapnia: Secondary | ICD-10-CM | POA: Diagnosis not present

## 2019-12-26 DIAGNOSIS — J9621 Acute and chronic respiratory failure with hypoxia: Secondary | ICD-10-CM | POA: Diagnosis not present

## 2019-12-26 DIAGNOSIS — E875 Hyperkalemia: Secondary | ICD-10-CM | POA: Diagnosis not present

## 2019-12-26 DIAGNOSIS — N2581 Secondary hyperparathyroidism of renal origin: Secondary | ICD-10-CM | POA: Diagnosis not present

## 2019-12-26 DIAGNOSIS — N186 End stage renal disease: Secondary | ICD-10-CM | POA: Diagnosis not present

## 2019-12-26 DIAGNOSIS — I5043 Acute on chronic combined systolic (congestive) and diastolic (congestive) heart failure: Secondary | ICD-10-CM | POA: Diagnosis not present

## 2019-12-26 LAB — CBC
HCT: 22.3 % — ABNORMAL LOW (ref 39.0–52.0)
Hemoglobin: 7.2 g/dL — ABNORMAL LOW (ref 13.0–17.0)
MCH: 29 pg (ref 26.0–34.0)
MCHC: 32.3 g/dL (ref 30.0–36.0)
MCV: 89.9 fL (ref 80.0–100.0)
Platelets: 341 10*3/uL (ref 150–400)
RBC: 2.48 MIL/uL — ABNORMAL LOW (ref 4.22–5.81)
RDW: 17.7 % — ABNORMAL HIGH (ref 11.5–15.5)
WBC: 9.2 10*3/uL (ref 4.0–10.5)
nRBC: 0.2 % (ref 0.0–0.2)

## 2019-12-26 LAB — RENAL FUNCTION PANEL
Albumin: 2.5 g/dL — ABNORMAL LOW (ref 3.5–5.0)
Anion gap: 18 — ABNORMAL HIGH (ref 5–15)
BUN: 155 mg/dL — ABNORMAL HIGH (ref 6–20)
CO2: 24 mmol/L (ref 22–32)
Calcium: 8.9 mg/dL (ref 8.9–10.3)
Chloride: 85 mmol/L — ABNORMAL LOW (ref 98–111)
Creatinine, Ser: 6.98 mg/dL — ABNORMAL HIGH (ref 0.61–1.24)
GFR calc Af Amer: 10 mL/min — ABNORMAL LOW (ref >60)
GFR calc non Af Amer: 8 mL/min — ABNORMAL LOW (ref >60)
Glucose, Bld: 102 mg/dL — ABNORMAL HIGH (ref 70–99)
Phosphorus: 4.9 mg/dL — ABNORMAL HIGH (ref 2.5–4.6)
Potassium: 6.1 mmol/L — ABNORMAL HIGH (ref 3.5–5.1)
Sodium: 127 mmol/L — ABNORMAL LOW (ref 135–145)

## 2019-12-26 LAB — HEPATITIS B SURFACE ANTIGEN: Hepatitis B Surface Ag: NONREACTIVE

## 2019-12-26 LAB — HEPATITIS B CORE ANTIBODY, TOTAL: Hep B Core Total Ab: REACTIVE — AB

## 2019-12-26 LAB — POTASSIUM: Potassium: 3.4 mmol/L — ABNORMAL LOW (ref 3.5–5.1)

## 2019-12-26 LAB — HEPATITIS B SURFACE ANTIBODY,QUALITATIVE: Hep B S Ab: REACTIVE — AB

## 2019-12-26 LAB — MAGNESIUM: Magnesium: 2.4 mg/dL (ref 1.7–2.4)

## 2019-12-26 MED FILL — Norepinephrine Bitartrate IV Soln 1 MG/ML (Base Equivalent): INTRAVENOUS | Qty: 4 | Status: AC

## 2019-12-26 MED FILL — Sodium Chloride IV Soln 0.9%: INTRAVENOUS | Qty: 250 | Status: AC

## 2019-12-26 NOTE — Consult Note (Deleted)
[facility] CONSULT NOTE    Date: 12/26/2019                  Patient Name:  [name] [name]  MRN: [id number]  DOB: June 21, 1966  Age / Sex: 54 y.o., male         PCP: Jean Rosenthal, MD                 Service Requesting Consult:  Hospitalist                 Reason for Consult:  Evaluation management of ESRD            History of Present Illness: Patient is a 54 y.o. male with a PMHx of recent cardiac arrest status post hypothermia protocol, ESRD on HD MWF, anemia of chronic kidney disease, secondary hyperparathyroidism, history of polysubstance abuse, hypertension,, chronic systolic heart failure, medical nonadherence who was admitted to [facility] on 12/24/2019 for ongoing management of acute respiratory failure.  He was originally admitted to [facility] on 11/16/2019.  He had a witnessed PEA cardiac arrest towards the end of dialysis treatment.  He had emergent cricothyroidotomy performed.  On 11/22/2019 he had another cardiac arrest.  He was apparently not a candidate for ICD with new tracheostomy and risk of infection.  He also had significant hypotension and has been maintained on midodrine.  For his acute on chronic respiratory failure he is status post tracheostomy placement and still on the ventilator.  He also has severe underlying congestive heart failure with a EF of less than 20%.  In regards to his ESRD he was maintained on CRRT from 12/14/2019 to  12/17/2019.   Medications: Outpatient medications: Medications Prior to Admission  Medication Sig Dispense Refill Last Dose  . acetaminophen (TYLENOL) 650 MG suppository Place 1 suppository (650 mg total) rectally every 6 (six) hours as needed for fever. 12 suppository 0   . Amino Acids-Protein Hydrolys (FEEDING SUPPLEMENT, PRO-STAT SUGAR FREE 64,) LIQD Place 30 mLs into feeding tube 5 (five) times daily. 887 mL 0   . amiodarone (PACERONE) 200 MG tablet Place 1 tablet (200 mg total) into feeding tube daily. 30  tablet 1   . apixaban (ELIQUIS) 5 MG TABS tablet Place 1 tablet (5 mg total) into feeding tube 2 (two) times daily. 60 tablet 0   . calcium acetate (PHOSLO) 667 MG capsule Take 3 capsules (2,001 mg total) by mouth 3 (three) times daily with meals. (Patient taking differently: Take 1,334-3,335 mg by mouth See admin instructions. Take 3-5 capsules (2001 - 3335 mg) by mouth 3 times daily with large meals, take 2 capsules (1334 mg) with snacks) 90 capsule 0   . clonazePAM (KLONOPIN) 1 MG tablet Place 1 tablet (1 mg total) into feeding tube 3 (three) times daily as needed (Vent weaning related anxiety). 90 tablet 0   . [START ON 12/28/2019] Darbepoetin Alfa (ARANESP) 100 MCG/0.5ML SOSY injection Inject 0.5 mLs (100 mcg total) into the vein every Wednesday with hemodialysis. 4.2 mL 0   . docusate (COLACE) 50 MG/5ML liquid Place 10 mLs (100 mg total) into feeding tube daily. 100 mL 0   . insulin aspart (NOVOLOG) 100 UNIT/ML injection Inject 0-9 Units into the skin every 4 (four) hours. 10 mL 11   . midodrine (PROAMATINE) 10 MG tablet Place 1 tablet (10 mg total) into feeding tube 3 (three) times daily with meals. 90 tablet 0   . multivitamin (RENA-VIT) TABS tablet Take 1  tablet by mouth at bedtime. 30 tablet 0   . nutrition supplement, JUVEN, (JUVEN) PACK Place 1 packet into feeding tube 2 (two) times daily. 60 packet 0   . Nutritional Supplements (FEEDING SUPPLEMENT, VITAL 1.5 CAL,) LIQD Place 1,000 mLs into feeding tube continuous. 1000 mL 0   . oxyCODONE (ROXICODONE) 5 MG/5ML solution Take 7.5 mLs (7.5 mg total) by mouth every 4 (four) hours as needed for moderate pain. 473 mL 0   . pantoprazole sodium (PROTONIX) 40 mg/20 mL PACK Place 20 mLs (40 mg total) into feeding tube at bedtime. 600 mL 0   . prochlorperazine (COMPAZINE) 5 MG tablet Place 1 tablet (5 mg total) into feeding tube every 6 (six) hours as needed for nausea or vomiting (hiccups). 30 tablet 0   . QUEtiapine (SEROQUEL) 25 MG tablet Take 1  tablet (25 mg total) by mouth 2 (two) times daily. 60 tablet 0     [name] medications: Levophed drip, Retacrit 10,000 units IV Monday Wednesday Friday, Humalog sliding scale, amiodarone 200 mg daily, calcium acetate 2001 mg 3 times daily, Eliquis 5 mg twice daily, levothyroxine 25 mcg daily, midodrine 10 mg 3 times daily, pantoprazole 40 mg twice daily, prostat 30 cc 5 times per day, Seroquel 25 mg twice daily  Allergies: Allergies  Allergen Reactions  . Losartan Cough      Past Medical History: Past Medical History:  Diagnosis Date  . Abdominal distension 08/10/2018  . Acute on chronic respiratory failure with hypoxia ([place])   . Acute on chronic systolic and diastolic heart failure, NYHA class 4 ([place])   . Acute pulmonary embolism without acute cor pulmonale (HCC)   . Alcohol abuse 11/26/2010   Qualifier: Diagnosis of  By: [name] MD, Pershing Proud  . Anemia due to blood loss, acute 07/15/2012  . CHF (congestive heart failure) ([place])   . Cholecystitis 04/18/2014  . Chronic atrial fibrillation ([place])   . Cigarette smoker    has currently quit  . Cocaine abuse ([place]) 11/26/2010   Qualifier: Diagnosis of  By: [name] MD, Benjamine Mola  has been over a year  . Combined congestive systolic and diastolic heart failure ([place]) 04/18/2014   A. Echo 8/13: Severe LVH, EF 40-45%, inferoposterior akinesis, grade 2 diastolic dysfunction, moderate LAE, mild RVE, mildly reduced RVSF, mild RAE; cannot rule out R atrial mass-suggest TEE or cardiac MRI  //  B. Echo 3/17: Mild LVH, EF 25-30%, diffuse HK, grade 2 diastolic dysfunction, mild MR, severe LAE,  moderately reduced RVSF, severe RAE, mild TR, moderate PI, PASP 55 mmHg    . Dyspnea   . End stage renal disease on dialysis ([place])   . ESRD (end stage renal disease) on dialysis [facility])    MWF- [place] (05/18/2017)  . Hemodialysis patient ([place])    M,W,F  . Hernia, inguinal, right 08/05/2016  . Hypertension   . 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 Hypocalcemia 07/15/2012  . Medical non-compliance   . Non-ischemic cardiomyopathy ([place])    A. R/L HC 3/17: Normal coronary arteries, moderate pulmonary hypertension (PASP 65 mmHg), elevated LV filling pressures (LVEDP 45 mmHg)   . NSVT (nonsustained ventricular tachycardia) ([place]) 02/22/2016  . NSVT (nonsustained ventricular tachycardia) ([place]) 02/22/2016  . Pneumonia 11/12/2017  . Polysubstance abuse ([place])   . Prolonged Q-T interval on ECG 05/18/2017  . Prolonged QT interval 05/18/2017  . Restless  leg syndrome   . Solitary pulmonary nodule 10/10/2015   See cxr 10/09/2015 - CT rec 10/10/2015 >>>   . Upper airway cough syndrome 10/09/2015   Off ACEi around 1st Oct 2016  - Sinus CT 10/10/2015 >>>       Past Surgical History: Past Surgical History:  Procedure Laterality Date  . [place] TRANSPOSITION  01/19/2013   Procedure: BASCILIC VEIN TRANSPOSITION;  Surgeon: Conrad Minneola, MD;  Location: [facility];  Service: Vascular;  Laterality: Left;  left 2nd stage basilic vein transposition  . CARDIAC CATHETERIZATION N/A 02/25/2016   Procedure: Right/Left Heart Cath and Coronary Angiography;  Surgeon: Burnell Blanks, MD;  Location: [facility];  Service: Cardiovascular;  Laterality: N/A;  . INGUINAL HERNIA REPAIR Right 08/05/2016   Procedure: RIGHT INGUINAL HERNIA REPAIR WITH MESH;  Surgeon: Donnie Mesa, MD;  Location: [place];  Service: General;  Laterality: Right;  . INSERTION OF DIALYSIS CATHETER  07/17/2012   Procedure: INSERTION OF DIALYSIS CATHETER;  Surgeon: Conrad Rolling Hills, MD;  Location: [facility];  Service: Vascular;  Laterality: Right;  right internal jugular  . INSERTION OF MESH Right 08/05/2016   Procedure: INSERTION OF MESH;  Surgeon: Donnie Mesa, MD;  Location: [facility];  Service: General;  Laterality: Right;  . REVISION OF ARTERIOVENOUS GORETEX GRAFT Left  5/46/2703   Procedure: PLICATION OF ARTERIOVENOUS FISTULA LEFT ARM;  Surgeon: Elam [place], MD;  Location: [facility];  Service: Vascular;  Laterality: Left;  . REVISON OF ARTERIOVENOUS FISTULA Left 01/06/2017   Procedure: REVISON OF ARTERIOVENOUS FISTULA;  Surgeon: Conrad [place] Rock, MD;  Location: [facility];  Service: Vascular;  Laterality: Left;  . REVISON OF ARTERIOVENOUS FISTULA Left 10/27/2018   Procedure: REVISION PLICATION OF ARTERIOVENOUS FISTULA  LEFT UPPER ARM;  Surgeon: Waynetta [place], MD;  Location: [facility];  Service: Vascular;  Laterality: Left;  . TRACHEOSTOMY TUBE PLACEMENT N/A 11/24/2019   Procedure: TRACHEOSTOMY;  Surgeon: Izora Gala, MD;  Location: [facility];  Service: ENT;  Laterality: N/A;  . UMBILICAL HERNIA REPAIR N/A 08/05/2016   Procedure: [place];  Surgeon: Donnie Mesa, MD;  Location: [facility];  Service: General;  Laterality: N/A;  . VENOGRAM N/A 08/09/2012   Procedure: VENOGRAM;  Surgeon: Conrad Helix, MD;  Location: [facility] C[facility] LAB;  Service: Cardiovascular;  Laterality: N/A;     Family History: Family History  Problem Relation Age of Onset  . Hypertension Mother   . Diabetes Mother   . Renal Disease Mother   . Hypertension Father      Social History: Social History   Socioeconomic History  . Marital status: Single    Spouse name: Not on file  . Number of children: Not on file  . Years of education: Not on file  . Highest education level: Not on file  Occupational History  . Not on file  Tobacco Use  . Smoking status: Former Smoker    Years: 30.00    Types: Cigarettes    Quit date: 10/20/2018    Years since quitting: 1.1  . Smokeless tobacco: Never Used  Substance and Sexual Activity  . Alcohol use: Yes    Alcohol/week: 4.0 standard drinks    Types: 4 Cans of beer per week  . Drug use: No    Types: Marijuana    Comment: 05/18/2017 "qd"  . Sexual activity: Not Currently  Other Topics Concern  . Not on file  Social History  Narrative  . Not  on file   Social Determinants of Health   Financial Resource Strain:   . Difficulty of Paying Living Expenses: Not on file  Food Insecurity:   . Worried About Charity fundraiser in the Last Year: Not on file  . Ran Out of Food in the Last Year: Not on file  Transportation Needs:   . Lack of Transportation (Medical): Not on file  . Lack of Transportation (Non-Medical): Not on file  Physical Activity:   . Days of Exercise per Week: Not on file  . Minutes of Exercise per Session: Not on file  Stress:   . Feeling of Stress : Not on file  Social Connections:   . Frequency of Communication with Friends and Family: Not on file  . Frequency of Social Gatherings with Friends and Family: Not on file  . Attends Religious Services: Not on file  . Active Member of Clubs or Organizations: Not on file  . Attends Archivist Meetings: Not on file  . Marital Status: Not on file  Intimate Partner Violence:   . Fear of [name] or Ex-Partner: Not on file  . Emotionally Abused: Not on file  . Physically Abused: Not on file  . Sexually Abused: Not on file     Review of Systems: Unable to obtain from the patient as he is on the ventilator.  Vital Signs: Temperature 98.4 pulse 80 respirations 20 blood pressure 104/58 Weight trends: There were no vitals filed for this visit.  Physical Exam: General: NAD, laying in bed  Head: Normocephalic, atraumatic.  Eyes: Anicteric, EOMI  Nose: NG in place  Throat: Oropharynx nonerythematous, no exudate appreciated.   Neck: Tracheostomy in place  Lungs:  Normal respiratory effort. Clear to auscultation BL without crackles or wheezes.  Heart: S1S2 no rubs  Abdomen:  BS normoactive. Soft, Nondistended, non-tender.  No masses or organomegaly.  Extremities: No pretibial edema.  Neurologic: Awake, follows simple commads  Skin: No visible rashes, scars.    Lab results: Basic Metabolic Panel: Recent Labs  Lab 12/20/19 0356  12/21/19 0418 12/21/19 0418 12/22/19 0500 12/24/19 0539 12/25/19 0350 12/26/19 0535  NA 130* 128*  --  135 132* 129* 127*  K 3.9 3.9  --  4.3 4.3 4.7 6.1*  CL 91* 90*  --  95* 91* 88* 85*  CO2 24 22  --  27 26 23 24   GLUCOSE 107* 117*  --  103* 119* 127* 102*  BUN 58* 89*  --  62* 85* 115* 155*  CREATININE 3.84* 5.51*  --  4.02* 4.75* 5.87* 6.98*  CALCIUM 8.4* 8.4*   < > 8.4* 8.6* 8.6* 8.9  MG 2.5* 2.6*  --  2.4  --  2.3 2.4  PHOS 4.6  --   --  4.5  --  5.1* 4.9*   < > = values in this interval not displayed.    Liver Function Tests: Recent Labs  Lab 12/24/19 0539 12/25/19 0350 12/26/19 0535  AST 19  --   --   ALT 6  --   --   ALKPHOS 249*  --   --   BILITOT 0.8  --   --   PROT 7.6  --   --   ALBUMIN 2.5* 2.4* 2.5*   No results for input(s): LIPASE, AMYLASE in the last 168 hours. No results for input(s): AMMONIA in the last 168 hours.  CBC: Recent Labs  Lab 12/22/19 0500 12/23/19 0323 12/24/19 0539 12/25/19 0350 12/26/19 0535  WBC 10.4 9.3 10.2 9.9 9.2  HGB 7.1* 7.4* 7.5* 7.3* 7.2*  HCT 22.6* 23.1* 24.7* 23.2* 22.3*  MCV 91.1 91.3 92.2 90.3 89.9  PLT 332 319 364 370 341    Cardiac Enzymes: No results for input(s): CKTOTAL, CKMB, CKMBINDEX, TROPONINI in the last 168 hours.  BNP: Invalid input(s): POCBNP  CBG: Recent Labs  Lab 12/23/19 0309 12/23/19 0719 12/23/19 1132 12/23/19 1528 12/23/19 1945  GLUCAP 123* 102* 118* 111* 127*    Microbiology: Results for orders placed or performed during the hospital encounter of 11/16/19  SARS Coronavirus 2 by RT PCR (hospital order, performed in [facility] hospital lab) Nasopharyngeal Nasopharyngeal Swab     Status: None   Collection Time: 11/16/19 11:41 AM   Specimen: Nasopharyngeal Swab  Result Value Ref Range Status   SARS Coronavirus 2 NEGATIVE NEGATIVE Final    Comment: (NOTE) SARS-CoV-2 target nucleic acids are NOT DETECTED. The SARS-CoV-2 RNA is generally detectable in upper and lower respiratory  specimens during the acute phase of infection. The lowest concentration of SARS-CoV-2 viral copies this assay can detect is 250 copies / mL. A negative result does not preclude SARS-CoV-2 infection and should not be used as the sole basis for treatment or other patient management decisions.  A negative result may occur with improper specimen collection / handling, submission of specimen other than nasopharyngeal swab, presence of viral mutation(s) within the areas targeted by this assay, and inadequate number of viral copies (<250 copies / mL). A negative result must be combined with clinical observations, patient history, and epidemiological information. Fact Sheet for Patients:   StrictlyIdeas.no Fact Sheet for Healthcare Providers: BankingDealers.co.za This test is not yet approved or cleared  by the [place] FDA and has been authorized for detection and/or diagnosis of SARS-CoV-2 by FDA under an Emergency Use Authorization (EUA).  This EUA will remain in effect (meaning this test can be used) for the duration of the COVID-19 declaration under Section 564(b)(1) of the Act, 21 U.S.C. section 360bbb-3(b)(1), unless the authorization is terminated or revoked sooner. Performed at [facility], [place] [address], [place], [place] [address]   Culture, blood (routine x 2)     Status: None   Collection Time: 11/16/19  6:03 PM   Specimen: BLOOD RIGHT HAND  Result Value Ref Range Status   Specimen Description BLOOD RIGHT HAND  Final   Special Requests   Final    BOTTLES DRAWN AEROBIC AND ANAEROBIC Blood Culture results may not be optimal due to an inadequate volume of blood received in culture bottles   Culture   Final    NO GROWTH 5 DAYS Performed at [facility], [place] [address] [place], [name] [address]    Report Status 11/21/2019 FINAL  Final  MRSA PCR Screening     Status: None   Collection Time: 11/16/19  6:05 PM    Specimen: Nasal Mucosa; Nasopharyngeal  Result Value Ref Range Status   MRSA by PCR NEGATIVE NEGATIVE Final    Comment:        The GeneXpert MRSA Assay (FDA approved for NASAL specimens only), is one component of a comprehensive MRSA colonization surveillance program. It is not intended to diagnose MRSA infection nor to guide or monitor treatment for MRSA infections. Performed at [facility], [place] [address] [place], [place] [address]   Culture, blood (routine x 2)     Status: None   Collection Time: 11/16/19  6:08 PM  Specimen: BLOOD RIGHT HAND  Result Value Ref Range Status   Specimen Description BLOOD RIGHT HAND  Final   Special Requests   Final    BOTTLES DRAWN AEROBIC ONLY Blood Culture results may not be optimal due to an inadequate volume of blood received in culture bottles   Culture   Final    NO GROWTH 5 DAYS Performed at [facility], [place] [address] [place], [place] [address]    Report Status 11/21/2019 FINAL  Final  Culture, respiratory (non-expectorated)     Status: None   Collection Time: 11/18/19 12:28 PM   Specimen: Tracheal Aspirate; Respiratory  Result Value Ref Range Status   Specimen Description TRACHEAL ASPIRATE  Final   Special Requests NONE  Final   Gram Stain   Final    ABUNDANT WBC PRESENT,BOTH PMN AND MONONUCLEAR ABUNDANT GRAM POSITIVE COCCI ABUNDANT GRAM NEGATIVE RODS FEW GRAM VARIABLE ROD    Culture   Final    ABUNDANT HAEMOPHILUS INFLUENZAE BETA LACTAMASE POSITIVE Performed at [facility], [place] [address], [place], [place] [address]    Report Status 11/20/2019 FINAL  Final  Surgical pcr screen     Status: Abnormal   Collection Time: 11/24/19  7:53 AM   Specimen: Nasal Mucosa; Nasal Swab  Result Value Ref Range Status   MRSA, PCR NEGATIVE NEGATIVE Final   Staphylococcus aureus POSITIVE (A) NEGATIVE Final    Comment: (NOTE) The Xpert SA Assay (FDA approved for NASAL specimens in patients 70 years of age and older),  is one component of a comprehensive surveillance program. It is not intended to diagnose infection nor to guide or monitor treatment. Performed at [facility], [place] [address], [place], [place] [address]   Culture, blood (routine x 2)     Status: None   Collection Time: 11/25/19  3:30 PM   Specimen: BLOOD  Result Value Ref Range Status   Specimen Description BLOOD RIGHT ANTECUBITAL  Final   Special Requests   Final    BOTTLES DRAWN AEROBIC ONLY Blood Culture results may not be optimal due to an inadequate volume of blood received in culture bottles   Culture   Final    NO GROWTH 5 DAYS Performed at [facility], [place] [address], [place], [place] [address]    Report Status 11/30/2019 FINAL  Final  Culture, blood (routine x 2)     Status: None   Collection Time: 11/25/19  3:30 PM   Specimen: BLOOD RIGHT HAND  Result Value Ref Range Status   Specimen Description BLOOD RIGHT HAND  Final   Special Requests   Final    BOTTLES DRAWN AEROBIC AND ANAEROBIC Blood Culture results may not be optimal due to an inadequate volume of blood received in culture bottles   Culture   Final    NO GROWTH 5 DAYS Performed at [facility], [place] [address], [place], [place] [address]    Report Status 11/30/2019 FINAL  Final  Culture, respiratory (non-expectorated)     Status: None   Collection Time: 11/25/19  4:35 PM   Specimen: Tracheal Aspirate; Respiratory  Result Value Ref Range Status   Specimen Description TRACHEAL ASPIRATE  Final   Special Requests NONE  Final   Gram Stain   Final    ABUNDANT WBC PRESENT,BOTH PMN AND MONONUCLEAR FEW GRAM POSITIVE COCCI Performed at [facility], [place] [address], [place], [place] [address]    Culture FEW STAPHYLOCOCCUS EPIDERMIDIS  Final   Report Status 11/28/2019 FINAL  Final   Organism ID, Bacteria STAPHYLOCOCCUS EPIDERMIDIS  Final      Susceptibility   Staphylococcus epidermidis - MIC*    CIPROFLOXACIN <=0.5 SENSITIVE Sensitive      ERYTHROMYCIN >=8 RESISTANT Resistant     GENTAMICIN <=0.5 SENSITIVE Sensitive     OXACILLIN >=4 RESISTANT Resistant     TETRACYCLINE <=1 SENSITIVE Sensitive     VANCOMYCIN 2 SENSITIVE Sensitive     TRIMETH/SULFA <=10 SENSITIVE Sensitive     CLINDAMYCIN >=8 RESISTANT Resistant     RIFAMPIN <=0.5 SENSITIVE Sensitive     Inducible Clindamycin NEGATIVE Sensitive     * FEW STAPHYLOCOCCUS EPIDERMIDIS  Culture, respiratory (non-expectorated)     Status: None   Collection Time: 12/15/19  4:12 PM   Specimen: Tracheal Aspirate; Respiratory  Result Value Ref Range Status   Specimen Description TRACHEAL ASPIRATE  Final   Special Requests NONE  Final   Gram Stain   Final    MODERATE WBC PRESENT, PREDOMINANTLY PMN MODERATE GRAM POSITIVE COCCI IN PAIRS IN CLUSTERS Performed at [facility], [address], [place], [place] [address]    Culture MODERATE STAPHYLOCOCCUS AUREUS  Final   Report Status 12/17/2019 FINAL  Final   Organism ID, Bacteria STAPHYLOCOCCUS AUREUS  Final      Susceptibility   Staphylococcus aureus - MIC*    CIPROFLOXACIN <=0.5 SENSITIVE Sensitive     ERYTHROMYCIN <=0.25 SENSITIVE Sensitive     GENTAMICIN <=0.5 SENSITIVE Sensitive     OXACILLIN <=0.25 SENSITIVE Sensitive     TETRACYCLINE <=1 SENSITIVE Sensitive     VANCOMYCIN 1 SENSITIVE Sensitive     TRIMETH/SULFA <=10 SENSITIVE Sensitive     CLINDAMYCIN <=0.25 SENSITIVE Sensitive     RIFAMPIN <=0.5 SENSITIVE Sensitive     Inducible Clindamycin NEGATIVE Sensitive     * MODERATE STAPHYLOCOCCUS AUREUS  MRSA PCR Screening     Status: None   Collection Time: 12/17/19 10:25 AM   Specimen: Nasopharyngeal  Result Value Ref Range Status   MRSA by PCR NEGATIVE NEGATIVE Final    Comment:        The GeneXpert MRSA Assay (FDA approved for NASAL specimens only), is one component of a comprehensive MRSA colonization surveillance program. It is not intended to diagnose MRSA infection nor to guide or monitor treatment  for MRSA infections. Performed at [facility], [place] [address] [place], [place] [address]   Culture, blood (routine x 2)     Status: None   Collection Time: 12/17/19  3:15 PM   Specimen: BLOOD  Result Value Ref Range Status   Specimen Description BLOOD RIGHT ANTECUBITAL  Final   Special Requests   Final    BOTTLES DRAWN AEROBIC AND ANAEROBIC Blood Culture results may not be optimal due to an inadequate volume of blood received in culture bottles   Culture   Final    NO GROWTH 5 DAYS Performed at [facility], [place] [address] [place], [place] [address]    Report Status 12/22/2019 FINAL  Final  Culture, blood (routine x 2)     Status: None   Collection Time: 12/17/19  3:20 PM   Specimen: BLOOD RIGHT HAND  Result Value Ref Range Status   Specimen Description BLOOD RIGHT HAND  Final   Special Requests   Final    BOTTLES DRAWN AEROBIC ONLY Blood Culture results may not be optimal due to an inadequate volume of blood received in  culture bottles   Culture   Final    NO GROWTH 5 DAYS Performed at [facility], [place] [address], [place], [place] [address]    Report Status 12/22/2019 FINAL  Final    Coagulation Studies: No results for input(s): LABPROT, INR in the last 72 hours.  Urinalysis: No results for input(s): COLORURINE, LABSPEC, PHURINE, GLUCOSEU, HGBUR, BILIRUBINUR, KETONESUR, PROTEINUR, UROBILINOGEN, NITRITE, LEUKOCYTESUR in the last 72 hours.  Invalid input(s): APPERANCEUR    Imaging: DG CHEST PORT 1 VIEW  Result Date: 12/25/2019 CLINICAL DATA:  Ventilator dependence.  Pneumonia. EXAM: PORTABLE CHEST 1 VIEW COMPARISON:  12/24/2019 FINDINGS: 0820 hours. The cardio pericardial silhouette is enlarged. Tracheostomy tube remains in place. A feeding tube passes into the stomach although the distal tip position is not included on the film. Right IJ central line tip overlies the right innominate vein. Patchy bilateral airspace disease is similar to prior. No  substantial pleural effusion. IMPRESSION: 1. No substantial interval change. 2. Marked enlargement cardiopericardial silhouette. 3. Patchy bilateral airspace opacities without pleural effusion. Electronically Signed   By: Misty Stanley M.D.   On: 12/25/2019 09:21   DG Chest Port 1 View  Result Date: 12/24/2019 CLINICAL DATA:  Subcutaneous emphysema. Ventilator dependence. Pneumonia. EXAM: PORTABLE CHEST 1 VIEW COMPARISON:  December 24, 2019 FINDINGS: The feeding tube terminates below today's film. The right central line terminates in the region of the right brachiocephalic vein, stable. A tracheostomy tube is stable. No pneumothorax. Bilateral pulmonary infiltrates remain, similar in the interval. Stable cardiomegaly. The hila and mediastinum are unchanged. No other acute abnormalities. IMPRESSION: 1. Support apparatus as above. 2. Bilateral pulmonary infiltrates are similar in the interval. Electronically Signed   By: Dorise Bullion III M.D   On: 12/24/2019 11:42      Assessment & Plan: Pt is a 54 y.o. male with a PMHx of recent cardiac arrest status post hypothermia protocol, ESRD on HD MWF, anemia of chronic kidney disease, secondary hyperparathyroidism, history of polysubstance abuse, hypertension,, chronic systolic heart failure, medical nonadherence who was admitted to [facility] on 12/24/2019 for ongoing management of acute respiratory failure.  1.  ESRD on HD MWF.  Estimated dry weight 74.5 kg.  We will plan for hemodialysis treatment today.  Ultrafiltration target 1.5 kg.  2.  Anemia of chronic kidney disease.  Hemoglobin drifting down and currently 7.2.  Patient to be started on Retacrit 10,000 units IV with dialysis while here.  Consider blood transfusion for hemoglobin of 7 or less.  3.  Secondary hyperparathyroidism.  Phosphorus acceptable at 4.9.  Maintain the patient on calcium acetate 3 tablets p.o. 3 times daily with meals.  4.  Hyperkalemia.  Serum potassium high at 6.1.  Patient to be  started on dialysis today.  Continue to monitor serum potassium.  5.  Acute respiratory failure.  Patient still on the ventilator via tracheostomy.

## 2019-12-26 NOTE — Consult Note (Signed)
[facility] CONSULT NOTE    Date: 12/26/2019                  Patient Name:  [name] [name]  MRN: [id number]  DOB: [birth year]  Age / Sex: 54 y.o., male         PCP: Jean Rosenthal, MD                 Service Requesting Consult:  Hospitalist                 Reason for Consult:  Evaluation management of ESRD            History of Present Illness: Patient is a 54 y.o. male with a PMHx of recent cardiac arrest status post hypothermia protocol, ESRD on HD MWF, anemia of chronic kidney disease, secondary hyperparathyroidism, history of polysubstance abuse, hypertension,, chronic systolic heart failure, medical nonadherence who was admitted to [facility] on 12/24/2019 for ongoing management of acute respiratory failure.  He was originally admitted to [facility] on 11/16/2019.  He had a witnessed PEA cardiac arrest towards the end of dialysis treatment.  He had emergent cricothyroidotomy performed.  On 11/22/2019 he had another cardiac arrest.  He was apparently not a candidate for ICD with new tracheostomy and risk of infection.  He also had significant hypotension and has been maintained on midodrine.  For his acute on chronic respiratory failure he is status post tracheostomy placement and still on the ventilator.  He also has severe underlying congestive heart failure with a EF of less than 20%.  In regards to his ESRD he was maintained on CRRT from 12/14/2019 to  12/17/2019.   Medications: Outpatient medications: Medications Prior to Admission  Medication Sig Dispense Refill Last Dose  . acetaminophen (TYLENOL) 650 MG suppository Place 1 suppository (650 mg total) rectally every 6 (six) hours as needed for fever. 12 suppository 0   . Amino Acids-Protein Hydrolys (FEEDING SUPPLEMENT, PRO-STAT SUGAR FREE 64,) LIQD Place 30 mLs into feeding tube 5 (five) times daily. 887 mL 0   . amiodarone (PACERONE) 200 MG tablet Place 1 tablet (200 mg total) into feeding tube daily. 30  tablet 1   . apixaban (ELIQUIS) 5 MG TABS tablet Place 1 tablet (5 mg total) into feeding tube 2 (two) times daily. 60 tablet 0   . calcium acetate (PHOSLO) 667 MG capsule Take 3 capsules (2,001 mg total) by mouth 3 (three) times daily with meals. (Patient taking differently: Take 1,334-3,335 mg by mouth See admin instructions. Take 3-5 capsules (2001 - 3335 mg) by mouth 3 times daily with large meals, take 2 capsules (1334 mg) with snacks) 90 capsule 0   . clonazePAM (KLONOPIN) 1 MG tablet Place 1 tablet (1 mg total) into feeding tube 3 (three) times daily as needed (Vent weaning related anxiety). 90 tablet 0   . [START ON 12/28/2019] Darbepoetin Alfa (ARANESP) 100 MCG/0.5ML SOSY injection Inject 0.5 mLs (100 mcg total) into the vein every Wednesday with hemodialysis. 4.2 mL 0   . docusate (COLACE) 50 MG/5ML liquid Place 10 mLs (100 mg total) into feeding tube daily. 100 mL 0   . insulin aspart (NOVOLOG) 100 UNIT/ML injection Inject 0-9 Units into the skin every 4 (four) hours. 10 mL 11   . midodrine (PROAMATINE) 10 MG tablet Place 1 tablet (10 mg total) into feeding tube 3 (three) times daily with meals. 90 tablet 0   . multivitamin (RENA-VIT) TABS tablet Take 1  tablet by mouth at bedtime. 30 tablet 0   . nutrition supplement, JUVEN, (JUVEN) PACK Place 1 packet into feeding tube 2 (two) times daily. 60 packet 0   . Nutritional Supplements (FEEDING SUPPLEMENT, VITAL 1.5 CAL,) LIQD Place 1,000 mLs into feeding tube continuous. 1000 mL 0   . oxyCODONE (ROXICODONE) 5 MG/5ML solution Take 7.5 mLs (7.5 mg total) by mouth every 4 (four) hours as needed for moderate pain. 473 mL 0   . pantoprazole sodium (PROTONIX) 40 mg/20 mL PACK Place 20 mLs (40 mg total) into feeding tube at bedtime. 600 mL 0   . prochlorperazine (COMPAZINE) 5 MG tablet Place 1 tablet (5 mg total) into feeding tube every 6 (six) hours as needed for nausea or vomiting (hiccups). 30 tablet 0   . QUEtiapine (SEROQUEL) 25 MG tablet Take 1  tablet (25 mg total) by mouth 2 (two) times daily. 60 tablet 0     Current medications: Levophed drip, Retacrit 10,000 units IV Monday Wednesday Friday, Humalog sliding scale, amiodarone 200 mg daily, calcium acetate 2001 mg 3 times daily, Eliquis 5 mg twice daily, levothyroxine 25 mcg daily, midodrine 10 mg 3 times daily, pantoprazole 40 mg twice daily, prostat 30 cc 5 times per day, Seroquel 25 mg twice daily  Allergies: Allergies  Allergen Reactions  . Losartan Cough      Past Medical History: Past Medical History:  Diagnosis Date  . Abdominal distension 08/10/2018  . Acute on chronic respiratory failure with hypoxia ([place])   . Acute on chronic systolic and diastolic heart failure, NYHA class 4 ([place])   . Acute pulmonary embolism without acute cor pulmonale (HCC)   . Alcohol abuse 11/26/2010   Qualifier: Diagnosis of  By: [name] MD, Pershing Proud  . Anemia due to blood loss, acute 07/15/2012  . CHF (congestive heart failure) ([place])   . Cholecystitis 04/18/2014  . Chronic atrial fibrillation ([place])   . Cigarette smoker    has currently quit  . Cocaine abuse ([place]) 11/26/2010   Qualifier: Diagnosis of  By: [name] MD, Benjamine Mola  has been over a year  . Combined congestive systolic and diastolic heart failure ([place]) 04/18/2014   A. Echo 8/13: Severe LVH, EF 40-45%, inferoposterior akinesis, grade 2 diastolic dysfunction, moderate LAE, mild RVE, mildly reduced RVSF, mild RAE; cannot rule out R atrial mass-suggest TEE or cardiac MRI  //  B. Echo 3/17: Mild LVH, EF 25-30%, diffuse HK, grade 2 diastolic dysfunction, mild MR, severe LAE,  moderately reduced RVSF, severe RAE, mild TR, moderate PI, PASP 55 mmHg    . Dyspnea   . End stage renal disease on dialysis ([place])   . ESRD (end stage renal disease) on dialysis [facility])    MWF- [place]  (05/18/2017)  . Hemodialysis patient ([place])    M,W,F  . Hernia, inguinal, right 08/05/2016  . Hypertension   . 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 Hypocalcemia 07/15/2012  . Medical non-compliance   . Non-ischemic cardiomyopathy ([place])    A. R/L HC 3/17: Normal coronary arteries, moderate pulmonary hypertension (PASP 65 mmHg), elevated LV filling pressures (LVEDP 45 mmHg)   . NSVT (nonsustained ventricular tachycardia) ([place]) 02/22/2016  . NSVT (nonsustained ventricular tachycardia) ([place]) 02/22/2016  . Pneumonia 11/12/2017  . Polysubstance abuse ([place])   . Prolonged Q-T interval on ECG 05/18/2017  . Prolonged QT interval 05/18/2017  . Restless  leg syndrome   . Solitary pulmonary nodule 10/10/2015   See cxr 10/09/2015 - CT rec 10/10/2015 >>>   . Upper airway cough syndrome 10/09/2015   Off ACEi around 1st Oct 2016  - Sinus CT 10/10/2015 >>>       Past Surgical History: Past Surgical History:  Procedure Laterality Date  . [place] TRANSPOSITION  01/19/2013   Procedure: BASCILIC VEIN TRANSPOSITION;  Surgeon: Conrad Basye, MD;  Location: [facility];  Service: Vascular;  Laterality: Left;  left 2nd stage basilic vein transposition  . CARDIAC CATHETERIZATION N/A 02/25/2016   Procedure: Right/Left Heart Cath and Coronary Angiography;  Surgeon: Burnell Blanks, MD;  Location: [facility];  Service: Cardiovascular;  Laterality: N/A;  . INGUINAL HERNIA REPAIR Right 08/05/2016   Procedure: RIGHT INGUINAL HERNIA REPAIR WITH MESH;  Surgeon: Donnie Mesa, MD;  Location: [place] [facility];  Service: General;  Laterality: Right;  . INSERTION OF DIALYSIS CATHETER  07/17/2012   Procedure: INSERTION OF DIALYSIS CATHETER;  Surgeon: Conrad [place], MD;  Location: [facility];  Service: Vascular;  Laterality: Right;  right internal jugular  . INSERTION OF MESH Right 08/05/2016   Procedure: INSERTION OF MESH;  Surgeon: Donnie Mesa, MD;  Location: [facility];  Service: General;  Laterality: Right;  . REVISION OF ARTERIOVENOUS GORETEX GRAFT Left  02/03/2541   Procedure: PLICATION OF ARTERIOVENOUS FISTULA LEFT ARM;  Surgeon: Elam Dutch, MD;  Location: [facility];  Service: Vascular;  Laterality: Left;  . REVISON OF ARTERIOVENOUS FISTULA Left 01/06/2017   Procedure: REVISON OF ARTERIOVENOUS FISTULA;  Surgeon: Conrad Valencia, MD;  Location: [facility];  Service: Vascular;  Laterality: Left;  . REVISON OF ARTERIOVENOUS FISTULA Left 10/27/2018   Procedure: REVISION PLICATION OF ARTERIOVENOUS FISTULA  LEFT UPPER ARM;  Surgeon: Waynetta Sandy, MD;  Location: [place];  Service: Vascular;  Laterality: Left;  . TRACHEOSTOMY TUBE PLACEMENT N/A 11/24/2019   Procedure: TRACHEOSTOMY;  Surgeon: Izora Gala, MD;  Location: [facility];  Service: ENT;  Laterality: N/A;  . UMBILICAL HERNIA REPAIR N/A 08/05/2016   Procedure: [place];  Surgeon: Donnie Mesa, MD;  Location: [place];  Service: General;  Laterality: N/A;  . VENOGRAM N/A 08/09/2012   Procedure: VENOGRAM;  Surgeon: Conrad Rome, MD;  Location: [facility];  Service: Cardiovascular;  Laterality: N/A;     Family History: Family History  Problem Relation Age of Onset  . Hypertension Mother   . Diabetes Mother   . Renal Disease Mother   . Hypertension Father      Social History: Social History   Socioeconomic History  . Marital status: Single    Spouse name: Not on file  . Number of children: Not on file  . Years of education: Not on file  . Highest education level: Not on file  Occupational History  . Not on file  Tobacco Use  . Smoking status: Former Smoker    Years: 30.00    Types: Cigarettes    Quit date: 10/20/2018    Years since quitting: 1.1  . Smokeless tobacco: Never Used  Substance and Sexual Activity  . Alcohol use: Yes    Alcohol/week: 4.0 standard drinks    Types: 4 Cans of beer per week  . Drug use: No    Types: Marijuana    Comment: 05/18/2017 "qd"  . Sexual activity: Not Currently  Other Topics Concern  . Not on file  Social History  Narrative  . Not  on file   Social Determinants of Health   Financial Resource Strain:   . Difficulty of Paying Living Expenses: Not on file  Food Insecurity:   . Worried About Charity fundraiser in the Last Year: Not on file  . Ran Out of Food in the Last Year: Not on file  Transportation Needs:   . Lack of Transportation (Medical): Not on file  . Lack of Transportation (Non-Medical): Not on file  Physical Activity:   . Days of Exercise per Week: Not on file  . Minutes of Exercise per Session: Not on file  Stress:   . Feeling of Stress : Not on file  Social Connections:   . Frequency of Communication with Friends and Family: Not on file  . Frequency of Social Gatherings with Friends and Family: Not on file  . Attends Religious Services: Not on file  . Active Member of Clubs or Organizations: Not on file  . Attends Archivist Meetings: Not on file  . Marital Status: Not on file  Intimate Partner Violence:   . Fear of Current or Ex-Partner: Not on file  . Emotionally Abused: Not on file  . Physically Abused: Not on file  . Sexually Abused: Not on file     Review of Systems: Unable to obtain from the patient as he is on the ventilator.  Vital Signs: Temperature 98.4 pulse 80 respirations 20 blood pressure 104/58 Weight trends: There were no vitals filed for this visit.  Physical Exam: General: NAD, laying in bed  Head: Normocephalic, atraumatic.  Eyes: Anicteric, EOMI  Nose: NG in place  Throat: Oropharynx nonerythematous, no exudate appreciated.   Neck: Tracheostomy in place  Lungs:  Normal respiratory effort. Clear to auscultation BL without crackles or wheezes.  Heart: S1S2 no rubs  Abdomen:  BS normoactive. Soft, Nondistended, non-tender.  No masses or organomegaly.  Extremities: No pretibial edema.  Neurologic: Awake, follows simple commads  Skin: No visible rashes, scars.    Lab results: Basic Metabolic Panel: Recent Labs  Lab 12/20/19 0356  12/21/19 0418 12/21/19 0418 12/22/19 0500 12/24/19 0539 12/25/19 0350 12/26/19 0535  NA 130* 128*  --  135 132* 129* 127*  K 3.9 3.9  --  4.3 4.3 4.7 6.1*  CL 91* 90*  --  95* 91* 88* 85*  CO2 24 22  --  27 26 23 24   GLUCOSE 107* 117*  --  103* 119* 127* 102*  BUN 58* 89*  --  62* 85* 115* 155*  CREATININE 3.84* 5.51*  --  4.02* 4.75* 5.87* 6.98*  CALCIUM 8.4* 8.4*   < > 8.4* 8.6* 8.6* 8.9  MG 2.5* 2.6*  --  2.4  --  2.3 2.4  PHOS 4.6  --   --  4.5  --  5.1* 4.9*   < > = values in this interval not displayed.    Liver Function Tests: Recent Labs  Lab 12/24/19 0539 12/25/19 0350 12/26/19 0535  AST 19  --   --   ALT 6  --   --   ALKPHOS 249*  --   --   BILITOT 0.8  --   --   PROT 7.6  --   --   ALBUMIN 2.5* 2.4* 2.5*   No results for input(s): LIPASE, AMYLASE in the last 168 hours. No results for input(s): AMMONIA in the last 168 hours.  CBC: Recent Labs  Lab 12/22/19 0500 12/23/19 0323 12/24/19 0539 12/25/19 0350 12/26/19 0535  WBC 10.4 9.3 10.2 9.9 9.2  HGB 7.1* 7.4* 7.5* 7.3* 7.2*  HCT 22.6* 23.1* 24.7* 23.2* 22.3*  MCV 91.1 91.3 92.2 90.3 89.9  PLT 332 319 364 370 341    Cardiac Enzymes: No results for input(s): CKTOTAL, CKMB, CKMBINDEX, TROPONINI in the last 168 hours.  BNP: Invalid input(s): POCBNP  CBG: Recent Labs  Lab 12/23/19 0309 12/23/19 0719 12/23/19 1132 12/23/19 1528 12/23/19 1945  GLUCAP 123* 102* 118* 111* 127*    Microbiology: Results for orders placed or performed during the hospital encounter of 11/16/19  SARS Coronavirus 2 by RT PCR (hospital order, performed in [facility] hospital lab) Nasopharyngeal Nasopharyngeal Swab     Status: None   Collection Time: 11/16/19 11:41 AM   Specimen: Nasopharyngeal Swab  Result Value Ref Range Status   SARS Coronavirus 2 NEGATIVE NEGATIVE Final    Comment: (NOTE) SARS-CoV-2 target nucleic acids are NOT DETECTED. The SARS-CoV-2 RNA is generally detectable in upper and lower respiratory  specimens during the acute phase of infection. The lowest concentration of SARS-CoV-2 viral copies this assay can detect is 250 copies / mL. A negative result does not preclude SARS-CoV-2 infection and should not be used as the sole basis for treatment or other patient management decisions.  A negative result may occur with improper specimen collection / handling, submission of specimen other than nasopharyngeal swab, presence of viral mutation(s) within the areas targeted by this assay, and inadequate number of viral copies (<250 copies / mL). A negative result must be combined with clinical observations, patient history, and epidemiological information. Fact Sheet for Patients:   StrictlyIdeas.no Fact Sheet for Healthcare Providers: BankingDealers.co.za This test is not yet approved or cleared  by the [place] FDA and has been authorized for detection and/or diagnosis of SARS-CoV-2 by FDA under an Emergency Use Authorization (EUA).  This EUA will remain in effect (meaning this test can be used) for the duration of the COVID-19 declaration under Section 564(b)(1) of the Act, 21 U.S.C. section 360bbb-3(b)(1), unless the authorization is terminated or revoked sooner. Performed at [facility], [place] [address], [place], [place] [address]   Culture, blood (routine x 2)     Status: None   Collection Time: 11/16/19  6:03 PM   Specimen: BLOOD RIGHT HAND  Result Value Ref Range Status   Specimen Description BLOOD RIGHT HAND  Final   Special Requests   Final    BOTTLES DRAWN AEROBIC AND ANAEROBIC Blood Culture results may not be optimal due to an inadequate volume of blood received in culture bottles   Culture   Final    NO GROWTH 5 DAYS Performed at [facility], [place] [address], [place], [place] [address]    Report Status 11/21/2019 FINAL  Final  MRSA PCR Screening     Status: None   Collection Time: 11/16/19  6:05 PM    Specimen: Nasal Mucosa; Nasopharyngeal  Result Value Ref Range Status   MRSA by PCR NEGATIVE NEGATIVE Final    Comment:        The GeneXpert MRSA Assay (FDA approved for NASAL specimens only), is one component of a comprehensive MRSA colonization surveillance program. It is not intended to diagnose MRSA infection nor to guide or monitor treatment for MRSA infections. Performed at [facility], [place] [address], [place], [place] [address]   Culture, blood (routine x 2)     Status: None   Collection Time: 11/16/19  6:08 PM  Specimen: BLOOD RIGHT HAND  Result Value Ref Range Status   Specimen Description BLOOD RIGHT HAND  Final   Special Requests   Final    BOTTLES DRAWN AEROBIC ONLY Blood Culture results may not be optimal due to an inadequate volume of blood received in culture bottles   Culture   Final    NO GROWTH 5 DAYS Performed at [facility], [place] [address] [place], [place] [address]    Report Status 11/21/2019 FINAL  Final  Culture, respiratory (non-expectorated)     Status: None   Collection Time: 11/18/19 12:28 PM   Specimen: Tracheal Aspirate; Respiratory  Result Value Ref Range Status   Specimen Description TRACHEAL ASPIRATE  Final   Special Requests NONE  Final   Gram Stain   Final    ABUNDANT WBC PRESENT,BOTH PMN AND MONONUCLEAR ABUNDANT GRAM POSITIVE COCCI ABUNDANT GRAM NEGATIVE RODS FEW GRAM VARIABLE ROD    Culture   Final    ABUNDANT HAEMOPHILUS INFLUENZAE BETA LACTAMASE POSITIVE Performed at [facility], [place] [address] [place], [place] [address]    Report Status 11/20/2019 FINAL  Final  Surgical pcr screen     Status: Abnormal   Collection Time: 11/24/19  7:53 AM   Specimen: Nasal Mucosa; Nasal Swab  Result Value Ref Range Status   MRSA, PCR NEGATIVE NEGATIVE Final   Staphylococcus aureus POSITIVE (A) NEGATIVE Final    Comment: (NOTE) The Xpert SA Assay (FDA approved for NASAL specimens in patients 52 years of age and older),  is one component of a comprehensive surveillance program. It is not intended to diagnose infection nor to guide or monitor treatment. Performed at [facility], [place] [address] [place], [place] [address]   Culture, blood (routine x 2)     Status: None   Collection Time: 11/25/19  3:30 PM   Specimen: BLOOD  Result Value Ref Range Status   Specimen Description BLOOD RIGHT ANTECUBITAL  Final   Special Requests   Final    BOTTLES DRAWN AEROBIC ONLY Blood Culture results may not be optimal due to an inadequate volume of blood received in culture bottles   Culture   Final    NO GROWTH 5 DAYS Performed at [facility], [place] [address], [place], [place] [address]    Report Status 11/30/2019 FINAL  Final  Culture, blood (routine x 2)     Status: None   Collection Time: 11/25/19  3:30 PM   Specimen: BLOOD RIGHT HAND  Result Value Ref Range Status   Specimen Description BLOOD RIGHT HAND  Final   Special Requests   Final    BOTTLES DRAWN AEROBIC AND ANAEROBIC Blood Culture results may not be optimal due to an inadequate volume of blood received in culture bottles   Culture   Final    NO GROWTH 5 DAYS Performed at [facility], [place] [address] [place],  [address]    Report Status 11/30/2019 FINAL  Final  Culture, respiratory (non-expectorated)     Status: None   Collection Time: 11/25/19  4:35 PM   Specimen: Tracheal Aspirate; Respiratory  Result Value Ref Range Status   Specimen Description TRACHEAL ASPIRATE  Final   Special Requests NONE  Final   Gram Stain   Final    ABUNDANT WBC PRESENT,BOTH PMN AND MONONUCLEAR FEW GRAM POSITIVE COCCI Performed at [facility], [place] [address], [place],  [address]    Culture FEW STAPHYLOCOCCUS EPIDERMIDIS  Final   Report Status 11/28/2019 FINAL  Final   Organism ID, Bacteria STAPHYLOCOCCUS EPIDERMIDIS  Final      Susceptibility   Staphylococcus epidermidis - MIC*    CIPROFLOXACIN <=0.5 SENSITIVE Sensitive      ERYTHROMYCIN >=8 RESISTANT Resistant     GENTAMICIN <=0.5 SENSITIVE Sensitive     OXACILLIN >=4 RESISTANT Resistant     TETRACYCLINE <=1 SENSITIVE Sensitive     VANCOMYCIN 2 SENSITIVE Sensitive     TRIMETH/SULFA <=10 SENSITIVE Sensitive     CLINDAMYCIN >=8 RESISTANT Resistant     RIFAMPIN <=0.5 SENSITIVE Sensitive     Inducible Clindamycin NEGATIVE Sensitive     * FEW STAPHYLOCOCCUS EPIDERMIDIS  Culture, respiratory (non-expectorated)     Status: None   Collection Time: 12/15/19  4:12 PM   Specimen: Tracheal Aspirate; Respiratory  Result Value Ref Range Status   Specimen Description TRACHEAL ASPIRATE  Final   Special Requests NONE  Final   Gram Stain   Final    MODERATE WBC PRESENT, PREDOMINANTLY PMN MODERATE GRAM POSITIVE COCCI IN PAIRS IN CLUSTERS Performed at [facility], [address] [place], [place] [address]    Culture MODERATE STAPHYLOCOCCUS AUREUS  Final   Report Status 12/17/2019 FINAL  Final   Organism ID, Bacteria STAPHYLOCOCCUS AUREUS  Final      Susceptibility   Staphylococcus aureus - MIC*    CIPROFLOXACIN <=0.5 SENSITIVE Sensitive     ERYTHROMYCIN <=0.25 SENSITIVE Sensitive     GENTAMICIN <=0.5 SENSITIVE Sensitive     OXACILLIN <=0.25 SENSITIVE Sensitive     TETRACYCLINE <=1 SENSITIVE Sensitive     VANCOMYCIN 1 SENSITIVE Sensitive     TRIMETH/SULFA <=10 SENSITIVE Sensitive     CLINDAMYCIN <=0.25 SENSITIVE Sensitive     RIFAMPIN <=0.5 SENSITIVE Sensitive     Inducible Clindamycin NEGATIVE Sensitive     * MODERATE STAPHYLOCOCCUS AUREUS  MRSA PCR Screening     Status: None   Collection Time: 12/17/19 10:25 AM   Specimen: Nasopharyngeal  Result Value Ref Range Status   MRSA by PCR NEGATIVE NEGATIVE Final    Comment:        The GeneXpert MRSA Assay (FDA approved for NASAL specimens only), is one component of a comprehensive MRSA colonization surveillance program. It is not intended to diagnose MRSA infection nor to guide or monitor treatment  for MRSA infections. Performed at [facility], [place] [address] [place], [place] [address]   Culture, blood (routine x 2)     Status: None   Collection Time: 12/17/19  3:15 PM   Specimen: BLOOD  Result Value Ref Range Status   Specimen Description BLOOD RIGHT ANTECUBITAL  Final   Special Requests   Final    BOTTLES DRAWN AEROBIC AND ANAEROBIC Blood Culture results may not be optimal due to an inadequate volume of blood received in culture bottles   Culture   Final    NO GROWTH 5 DAYS Performed at [facility], [place] [address] [place],  [address]    Report Status 12/22/2019 FINAL  Final  Culture, blood (routine x 2)     Status: None   Collection Time: 12/17/19  3:20 PM   Specimen: BLOOD RIGHT HAND  Result Value Ref Range Status   Specimen Description BLOOD RIGHT HAND  Final   Special Requests   Final    BOTTLES DRAWN AEROBIC ONLY Blood Culture results may not be optimal due to an inadequate volume of blood received in  culture bottles   Culture   Final    NO GROWTH 5 DAYS Performed at [facility], [place] [address], [place],  [address]    Report Status 12/22/2019 FINAL  Final    Coagulation Studies: No results for input(s): LABPROT, INR in the last 72 hours.  Urinalysis: No results for input(s): COLORURINE, LABSPEC, PHURINE, GLUCOSEU, HGBUR, BILIRUBINUR, KETONESUR, PROTEINUR, UROBILINOGEN, NITRITE, LEUKOCYTESUR in the last 72 hours.  Invalid input(s): APPERANCEUR    Imaging: DG CHEST PORT 1 VIEW  Result Date: 12/25/2019 CLINICAL DATA:  Ventilator dependence.  Pneumonia. EXAM: PORTABLE CHEST 1 VIEW COMPARISON:  12/24/2019 FINDINGS: 0820 hours. The cardio pericardial silhouette is enlarged. Tracheostomy tube remains in place. A feeding tube passes into the stomach although the distal tip position is not included on the film. Right IJ central line tip overlies the right innominate vein. Patchy bilateral airspace disease is similar to prior. No  substantial pleural effusion. IMPRESSION: 1. No substantial interval change. 2. Marked enlargement cardiopericardial silhouette. 3. Patchy bilateral airspace opacities without pleural effusion. Electronically Signed   By: Misty Stanley M.D.   On: 12/25/2019 09:21   DG Chest Port 1 View  Result Date: 12/24/2019 CLINICAL DATA:  Subcutaneous emphysema. Ventilator dependence. Pneumonia. EXAM: PORTABLE CHEST 1 VIEW COMPARISON:  December 24, 2019 FINDINGS: The feeding tube terminates below today's film. The right central line terminates in the region of the right brachiocephalic vein, stable. A tracheostomy tube is stable. No pneumothorax. Bilateral pulmonary infiltrates remain, similar in the interval. Stable cardiomegaly. The hila and mediastinum are unchanged. No other acute abnormalities. IMPRESSION: 1. Support apparatus as above. 2. Bilateral pulmonary infiltrates are similar in the interval. Electronically Signed   By: Dorise Bullion III M.D   On: 12/24/2019 11:42     Assessment & Plan: Pt is a 54 y.o. male with a PMHx of recent cardiac arrest status post hypothermia protocol, ESRD on HD MWF, anemia of chronic kidney disease, secondary hyperparathyroidism, history of polysubstance abuse, hypertension,, chronic systolic heart failure, medical nonadherence who was admitted to [facility] on 12/24/2019 for ongoing management of acute respiratory failure.  1.  ESRD on HD MWF.  Estimated dry weight 74.5 kg.  We will plan for hemodialysis treatment today.  Ultrafiltration target 1.5 kg.  2.  Anemia of chronic kidney disease.  Hemoglobin drifting down and currently 7.2.  Patient to be started on Retacrit 10,000 units IV with dialysis while here.  Consider blood transfusion for hemoglobin of 7 or less.  3.  Secondary hyperparathyroidism.  Phosphorus acceptable at 4.9.  Maintain the patient on calcium acetate 3 tablets p.o. 3 times daily with meals.  4.  Hyperkalemia.  Serum potassium high at 6.1.  Patient to be  started on dialysis today.  Continue to monitor serum potassium.  5.  Acute respiratory failure.  Patient still on the ventilator via tracheostomy.

## 2019-12-26 NOTE — Progress Notes (Signed)
Pulmonary [place]   PULMONARY CRITICAL CARE SERVICE  PROGRESS NOTE  Date of Service: 12/26/2019  [name] [name]  BJY:[id number]  DOB: November 08, 1966   DOA: 12/24/2019  Referring Physician: Merton Border, MD  HPI: [name] [name] is a 54 y.o. male seen for follow up of Acute on Chronic Respiratory Failure.  Patient is on full support right now on assist control mode currently on 30% FiO2 with PEEP of 5  Medications: Reviewed on Rounds  Physical Exam:  Vitals: Temperature 98.4 pulse 80 respiratory rate 20 blood pressure is 104/52 saturations 100%  Ventilator Settings mode ventilation assist control FiO2 30% tidal volume 591 PEEP 5  . General: Comfortable at this time . Eyes: Grossly normal lids, irises & conjunctiva . ENT: grossly tongue is normal . Neck: no obvious mass . Cardiovascular: S1 S2 normal no gallop . Respiratory: Scattered rhonchi expansion is equal . Abdomen: soft . Skin: no rash seen on limited exam . Musculoskeletal: not rigid . Psychiatric:unable to assess . Neurologic: no seizure no involuntary movements         Lab Data:   Basic Metabolic Panel: Recent Labs  Lab 12/20/19 0356 12/21/19 0418 12/22/19 0500 12/23/19 0323 12/24/19 0539 12/25/19 0350 12/26/19 0535  NA 130* 128* 135 131* 132* 129* 127*  K 3.9 3.9 4.3 4.6 4.3 4.7 6.1*  CL 91* 90* 95* 92* 91* 88* 85*  CO2 24 22 27 24 26 23 24   GLUCOSE 107* 117* 103* 104* 119* 127* 102*  BUN 58* 89* 62* 105* 85* 115* 155*  CREATININE 3.84* 5.51* 4.02* 5.61* 4.75* 5.87* 6.98*  CALCIUM 8.4* 8.4* 8.4* 8.4*  8.6* 8.6* 8.6* 8.9  MG 2.5* 2.6* 2.4  --   --  2.3 2.4  PHOS 4.6  --  4.5  --   --  5.1* 4.9*    ABG: Recent Labs  Lab 12/24/19 0145 12/25/19 0030  PHART 7.536* 7.485*  PCO2ART 33.8 33.8  PO2ART 74.0* 95.1  HCO3 28.7* 25.2  O2SAT 96.2 99.1    Liver Function Tests: Recent Labs  Lab 12/24/19 0539 12/25/19 0350 12/26/19 0535  AST 19  --   --   ALT  6  --   --   ALKPHOS 249*  --   --   BILITOT 0.8  --   --   PROT 7.6  --   --   ALBUMIN 2.5* 2.4* 2.5*   No results for input(s): LIPASE, AMYLASE in the last 168 hours. No results for input(s): AMMONIA in the last 168 hours.  CBC: Recent Labs  Lab 12/22/19 0500 12/23/19 0323 12/24/19 0539 12/25/19 0350 12/26/19 0535  WBC 10.4 9.3 10.2 9.9 9.2  HGB 7.1* 7.4* 7.5* 7.3* 7.2*  HCT 22.6* 23.1* 24.7* 23.2* 22.3*  MCV 91.1 91.3 92.2 90.3 89.9  PLT 332 319 364 370 341    Cardiac Enzymes: No results for input(s): CKTOTAL, CKMB, CKMBINDEX, TROPONINI in the last 168 hours.  BNP (last 3 results) Recent Labs    11/16/19 1356  BNP 962.8*    ProBNP (last 3 results) No results for input(s): PROBNP in the last 8760 hours.  Radiological Exams: DG CHEST PORT 1 VIEW  Result Date: 12/25/2019 CLINICAL DATA:  Ventilator dependence.  Pneumonia. EXAM: PORTABLE CHEST 1 VIEW COMPARISON:  12/24/2019 FINDINGS: 0820 hours. The cardio pericardial silhouette is enlarged. Tracheostomy tube remains in place. A feeding tube passes into the stomach although the distal tip position is not included on the film. Right  IJ central line tip overlies the right innominate vein. Patchy bilateral airspace disease is similar to prior. No substantial pleural effusion. IMPRESSION: 1. No substantial interval change. 2. Marked enlargement cardiopericardial silhouette. 3. Patchy bilateral airspace opacities without pleural effusion. Electronically Signed   By: Misty Stanley M.D.   On: 12/25/2019 09:21    Assessment/Plan Active Problems:   Acute on chronic respiratory failure with hypoxia (HCC)   End stage renal disease on dialysis (HCC)   Acute on chronic systolic and diastolic heart failure, NYHA class 4 (HCC)   Chronic atrial fibrillation ([place])   Acute pulmonary embolism without acute cor pulmonale ([place])   1. Acute on chronic respiratory failure hypoxia plan is to continue with assessing for weaning readiness.   Right now is on full support on assist control mode try to check the RSB I advance weaning 2. End-stage renal disease on hemodialysis followed by nephrology will continue with the recommendations 3. Acute on chronic systolic.  Diastolic heart failure monitor fluid status closely 4. Chronic atrial fibrillation rate controlled 5. Acute pulmonary embolism treated   I have personally seen and evaluated the patient, evaluated laboratory and imaging results, formulated the assessment and plan and placed orders. The Patient requires high complexity decision making with multiple systems involvement.  Rounds were done with the Respiratory Therapy Director and Staff therapists and discussed with nursing staff also.  Allyne Gee, MD [facility] Pulmonary Critical Care Medicine Sleep Medicine

## 2019-12-27 DIAGNOSIS — N186 End stage renal disease: Secondary | ICD-10-CM | POA: Diagnosis not present

## 2019-12-27 DIAGNOSIS — I5043 Acute on chronic combined systolic (congestive) and diastolic (congestive) heart failure: Secondary | ICD-10-CM | POA: Diagnosis not present

## 2019-12-27 DIAGNOSIS — I2699 Other pulmonary embolism without acute cor pulmonale: Secondary | ICD-10-CM | POA: Diagnosis not present

## 2019-12-27 DIAGNOSIS — I5021 Acute systolic (congestive) heart failure: Secondary | ICD-10-CM | POA: Diagnosis not present

## 2019-12-27 DIAGNOSIS — I482 Chronic atrial fibrillation, unspecified: Secondary | ICD-10-CM | POA: Diagnosis not present

## 2019-12-27 DIAGNOSIS — J9601 Acute respiratory failure with hypoxia: Secondary | ICD-10-CM | POA: Diagnosis not present

## 2019-12-27 DIAGNOSIS — I959 Hypotension, unspecified: Secondary | ICD-10-CM | POA: Diagnosis not present

## 2019-12-27 DIAGNOSIS — J9621 Acute and chronic respiratory failure with hypoxia: Secondary | ICD-10-CM | POA: Diagnosis not present

## 2019-12-27 DIAGNOSIS — J9690 Respiratory failure, unspecified, unspecified whether with hypoxia or hypercapnia: Secondary | ICD-10-CM | POA: Diagnosis not present

## 2019-12-27 DIAGNOSIS — I4821 Permanent atrial fibrillation: Secondary | ICD-10-CM | POA: Diagnosis not present

## 2019-12-27 NOTE — Progress Notes (Signed)
Pulmonary [place]   PULMONARY CRITICAL CARE SERVICE  PROGRESS NOTE  Date of Service: 12/27/2019  [name] [name]  [id number]:[id number]  DOB: 1966-08-21   DOA: 12/24/2019  Referring Physician: Merton Border, MD  HPI: [name] [name] is a 54 y.o. male seen for follow up of Acute on Chronic Respiratory Failure.  Patient is weaning right now is on pressure support mode has been on 12/5 with good volumes this morning was attempted and after short while was placed back on the ventilator on assist control mode  Medications: Reviewed on Rounds  Physical Exam:  Vitals: Temperature 97.3 pulse 90 respiratory rate 20 blood pressure 96/56 saturations 98%  Ventilator Settings mode assist control FiO2 30% tidal volume 590 PEEP 5  . General: Comfortable at this time . Eyes: Grossly normal lids, irises & conjunctiva . ENT: grossly tongue is normal . Neck: no obvious mass . Cardiovascular: S1 S2 normal no gallop . Respiratory: Scattered rhonchi expansion is equal . Abdomen: soft . Skin: no rash seen on limited exam . Musculoskeletal: not rigid . Psychiatric:unable to assess . Neurologic: no seizure no involuntary movements         Lab Data:   Basic Metabolic Panel: Recent Labs  Lab 12/21/19 0418 12/22/19 0500 12/23/19 0323 12/24/19 0539 12/25/19 0350 12/26/19 0535 12/26/19 1841  NA 128* 135 131* 132* 129* 127*  --   K 3.9 4.3 4.6 4.3 4.7 6.1* 3.4*  CL 90* 95* 92* 91* 88* 85*  --   CO2 22 27 24 26 23 24   --   GLUCOSE 117* 103* 104* 119* 127* 102*  --   BUN 89* 62* 105* 85* 115* 155*  --   CREATININE 5.51* 4.02* 5.61* 4.75* 5.87* 6.98*  --   CALCIUM 8.4* 8.4* 8.4*  8.6* 8.6* 8.6* 8.9  --   MG 2.6* 2.4  --   --  2.3 2.4  --   PHOS  --  4.5  --   --  5.1* 4.9*  --     ABG: Recent Labs  Lab 12/24/19 0145 12/25/19 0030  PHART 7.536* 7.485*  PCO2ART 33.8 33.8  PO2ART 74.0* 95.1  HCO3 28.7* 25.2  O2SAT 96.2 99.1    Liver Function  Tests: Recent Labs  Lab 12/24/19 0539 12/25/19 0350 12/26/19 0535  AST 19  --   --   ALT 6  --   --   ALKPHOS 249*  --   --   BILITOT 0.8  --   --   PROT 7.6  --   --   ALBUMIN 2.5* 2.4* 2.5*   No results for input(s): LIPASE, AMYLASE in the last 168 hours. No results for input(s): AMMONIA in the last 168 hours.  CBC: Recent Labs  Lab 12/22/19 0500 12/23/19 0323 12/24/19 0539 12/25/19 0350 12/26/19 0535  WBC 10.4 9.3 10.2 9.9 9.2  HGB 7.1* 7.4* 7.5* 7.3* 7.2*  HCT 22.6* 23.1* 24.7* 23.2* 22.3*  MCV 91.1 91.3 92.2 90.3 89.9  PLT 332 319 364 370 341    Cardiac Enzymes: No results for input(s): CKTOTAL, CKMB, CKMBINDEX, TROPONINI in the last 168 hours.  BNP (last 3 results) Recent Labs    11/16/19 1356  BNP 962.8*    ProBNP (last 3 results) No results for input(s): PROBNP in the last 8760 hours.  Radiological Exams: No results found.  Assessment/Plan Active Problems:   Acute on chronic respiratory failure with hypoxia (HCC)   End stage renal disease on dialysis (  [place])   Acute on chronic systolic and diastolic heart failure, NYHA class 4 (HCC)   Chronic atrial fibrillation ([place])   Acute pulmonary embolism without acute cor pulmonale ([place])   1. Acute on chronic respiratory failure with hypoxia patient was attempted on weaning on pressure support did not tolerate will have respiratory therapy reassess later today 2. End-stage renal disease on hemodialysis followed by nephrology 3. Acute on chronic systolic heart failure may be complex issue complicating ability to wean 4. Chronic atrial fibrillation rate is controlled 5. Acute pulmonary embolism treated we will continue with supportive care   I have personally seen and evaluated the patient, evaluated laboratory and imaging results, formulated the assessment and plan and placed orders. The Patient requires high complexity decision making with multiple systems involvement.  Rounds were done with the  Respiratory Therapy Director and Staff therapists and discussed with nursing staff also.  Allyne Gee, MD [facility] Pulmonary Critical Care Medicine Sleep Medicine

## 2019-12-27 NOTE — Consult Note (Signed)
Referring Physician:  Axl Rodino is an 54 y.o. male.                       Chief Complaint: Congestive heart failure  HPI: 54 years old black male had cardiac arrest during hemodialysis. CPR was started immediately for PEA. He had emergent cricothyroidotomy for failed [facility] Airway, this was later converted to tracheostomy. He received 3 rounds of epi, 1 g of calcium and 50 meq of bicarb with ROSC after 15 minutes.  He failed extubation trials and mostly needs full ventilator support even after 41 days. He had VT/VF cardiac arrest on 11/22/2019 with ongoing NSVT and need for amiodarone use. He needed pressure support for long time and was not a candidate for ICD per EP. His echocardiogram showed severe LV systolic dysfunction with less than 20 % EF. He is on Midodrine for hypotension and can not tolerate B-blocker or ACE inhibitor. His Hgb level is low at 7.2 to 7.5 g. He has bilateral feet gangrene with lower leg wounds and ischemic changes. He has faint DP pulses bilaterally.   Past Medical History:  Diagnosis Date  . Abdominal distension 08/10/2018  . Acute on chronic respiratory failure with hypoxia ([place])   . Acute on chronic systolic and diastolic heart failure, NYHA class 4 ([place])   . Acute pulmonary embolism without acute cor pulmonale (HCC)   . Alcohol abuse 11/26/2010   Qualifier: Diagnosis of  By: [name] MD, Pershing Proud  . Anemia due to blood loss, acute 07/15/2012  . CHF (congestive heart failure) ([place])   . Cholecystitis 04/18/2014  . Chronic atrial fibrillation ([place])   . Cigarette smoker    has currently quit  . Cocaine abuse ([place]) 11/26/2010   Qualifier: Diagnosis of  By: [name] MD, Benjamine Mola  has been over a year  . 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 Echo 8/13: Severe LVH, EF 40-45%, inferoposterior akinesis, grade 2 diastolic dysfunction, moderate LAE, mild RVE, mildly reduced RVSF, mild RAE; cannot rule out R atrial  mass-suggest TEE or cardiac MRI  //  B. Echo 3/17: Mild LVH, EF 25-30%, diffuse HK, grade 2 diastolic dysfunction, mild MR, severe LAE,  moderately reduced RVSF, severe RAE, mild TR, moderate PI, PASP 55 mmHg    . Dyspnea   . End stage renal disease on dialysis ([place])   . ESRD (end stage renal disease) on dialysis [facility])    MWF- [place] (05/18/2017)  . Hemodialysis patient ([place])    M,W,F  . Hernia, inguinal, right 08/05/2016  . Hypertension   . 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 Hypocalcemia 07/15/2012  . Medical non-compliance   . Non-ischemic cardiomyopathy ([place])    A. R/L HC 3/17: Normal coronary arteries, moderate pulmonary hypertension (PASP 65 mmHg), elevated LV filling pressures (LVEDP 45 mmHg)   . NSVT (nonsustained ventricular tachycardia) ([place]) 02/22/2016  . NSVT (nonsustained ventricular tachycardia) ([place]) 02/22/2016  . Pneumonia 11/12/2017  . Polysubstance abuse ([place])   . Prolonged Q-T interval on ECG 05/18/2017  . Prolonged QT interval 05/18/2017  . Restless leg syndrome   . Solitary pulmonary nodule 10/10/2015   See cxr 10/09/2015 - CT rec 10/10/2015 >>>   . Upper airway cough syndrome 10/09/2015   Off ACEi around 1st Oct 2016  - Sinus CT 10/10/2015 >>>  Past Surgical History:  Procedure Laterality Date  . [place] TRANSPOSITION  01/19/2013   Procedure: BASCILIC VEIN TRANSPOSITION;  Surgeon: Conrad Hephzibah, MD;  Location: [facility];  Service: Vascular;  Laterality: Left;  left 2nd stage basilic vein transposition  . CARDIAC CATHETERIZATION N/A 02/25/2016   Procedure: Right/Left Heart Cath and Coronary Angiography;  Surgeon: Burnell Blanks, MD;  Location: [facility] CV LAB;  Service: Cardiovascular;  Laterality: N/A;  . INGUINAL HERNIA REPAIR Right 08/05/2016   Procedure: RIGHT INGUINAL HERNIA REPAIR WITH MESH;  Surgeon: Donnie Mesa, MD;   Location: [facility];  Service: General;  Laterality: Right;  . INSERTION OF DIALYSIS CATHETER  07/17/2012   Procedure: INSERTION OF DIALYSIS CATHETER;  Surgeon: Conrad Pine Level, MD;  Location: [facility];  Service: Vascular;  Laterality: Right;  right internal jugular  . INSERTION OF MESH Right 08/05/2016   Procedure: INSERTION OF MESH;  Surgeon: Donnie Mesa, MD;  Location: [facility];  Service: General;  Laterality: Right;  . REVISION OF ARTERIOVENOUS GORETEX GRAFT Left 3/55/7322   Procedure: PLICATION OF ARTERIOVENOUS FISTULA LEFT ARM;  Surgeon: Elam Dutch, MD;  Location: [facility];  Service: Vascular;  Laterality: Left;  . REVISON OF ARTERIOVENOUS FISTULA Left 01/06/2017   Procedure: REVISON OF ARTERIOVENOUS FISTULA;  Surgeon: Conrad Tangier, MD;  Location: [place];  Service: Vascular;  Laterality: Left;  . REVISON OF ARTERIOVENOUS FISTULA Left 10/27/2018   Procedure: REVISION PLICATION OF ARTERIOVENOUS FISTULA  LEFT UPPER ARM;  Surgeon: Waynetta Sandy, MD;  Location: [facility];  Service: Vascular;  Laterality: Left;  . TRACHEOSTOMY TUBE PLACEMENT N/A 11/24/2019   Procedure: TRACHEOSTOMY;  Surgeon: Izora Gala, MD;  Location: [facility];  Service: ENT;  Laterality: N/A;  . UMBILICAL HERNIA REPAIR N/A 08/05/2016   Procedure: [place];  Surgeon: Donnie Mesa, MD;  Location: [facility] [place];  Service: General;  Laterality: N/A;  . VENOGRAM N/A 08/09/2012   Procedure: VENOGRAM;  Surgeon: Conrad Alameda, MD;  Location: [facility] CATH LAB;  Service: Cardiovascular;  Laterality: N/A;    Family History  Problem Relation Age of Onset  . Hypertension Mother   . Diabetes Mother   . Renal Disease Mother   . Hypertension Father    Social History:  reports that he quit smoking about 14 months ago. His smoking use included cigarettes. He quit after 30.00 years of use. He has never used smokeless tobacco. He reports current alcohol use of about 4.0 standard drinks of alcohol per week. He reports that he does not  use drugs.  Allergies:  Allergies  Allergen Reactions  . Losartan Cough    Medications Prior to Admission  Medication Sig Dispense Refill  . acetaminophen (TYLENOL) 650 MG suppository Place 1 suppository (650 mg total) rectally every 6 (six) hours as needed for fever. 12 suppository 0  . Amino Acids-Protein Hydrolys (FEEDING SUPPLEMENT, PRO-STAT SUGAR FREE 64,) LIQD Place 30 mLs into feeding tube 5 (five) times daily. 887 mL 0  . amiodarone (PACERONE) 200 MG tablet Place 1 tablet (200 mg total) into feeding tube daily. 30 tablet 1  . apixaban (ELIQUIS) 5 MG TABS tablet Place 1 tablet (5 mg total) into feeding tube 2 (two) times daily. 60 tablet 0  . calcium acetate (PHOSLO) 667 MG capsule Take 3 capsules (2,001 mg total) by mouth 3 (three) times daily with meals. (Patient taking differently: Take 1,334-3,335 mg by mouth See admin instructions. Take 3-5 capsules (2001 - 3335 mg) by mouth  3 times daily with large meals, take 2 capsules (1334 mg) with snacks) 90 capsule 0  . clonazePAM (KLONOPIN) 1 MG tablet Place 1 tablet (1 mg total) into feeding tube 3 (three) times daily as needed (Vent weaning related anxiety). 90 tablet 0  . [START ON 12/28/2019] Darbepoetin Alfa (ARANESP) 100 MCG/0.5ML SOSY injection Inject 0.5 mLs (100 mcg total) into the vein every Wednesday with hemodialysis. 4.2 mL 0  . docusate (COLACE) 50 MG/5ML liquid Place 10 mLs (100 mg total) into feeding tube daily. 100 mL 0  . insulin aspart (NOVOLOG) 100 UNIT/ML injection Inject 0-9 Units into the skin every 4 (four) hours. 10 mL 11  . midodrine (PROAMATINE) 10 MG tablet Place 1 tablet (10 mg total) into feeding tube 3 (three) times daily with meals. 90 tablet 0  . multivitamin (RENA-VIT) TABS tablet Take 1 tablet by mouth at bedtime. 30 tablet 0  . nutrition supplement, JUVEN, (JUVEN) PACK Place 1 packet into feeding tube 2 (two) times daily. 60 packet 0  . Nutritional Supplements (FEEDING SUPPLEMENT, VITAL 1.5 CAL,) LIQD Place  1,000 mLs into feeding tube continuous. 1000 mL 0  . oxyCODONE (ROXICODONE) 5 MG/5ML solution Take 7.5 mLs (7.5 mg total) by mouth every 4 (four) hours as needed for moderate pain. 473 mL 0  . pantoprazole sodium (PROTONIX) 40 mg/20 mL PACK Place 20 mLs (40 mg total) into feeding tube at bedtime. 600 mL 0  . prochlorperazine (COMPAZINE) 5 MG tablet Place 1 tablet (5 mg total) into feeding tube every 6 (six) hours as needed for nausea or vomiting (hiccups). 30 tablet 0  . QUEtiapine (SEROQUEL) 25 MG tablet Take 1 tablet (25 mg total) by mouth 2 (two) times daily. 60 tablet 0    Results for orders placed or performed during the hospital encounter of 12/24/19 (from the past 48 hour(s))  CBC     Status: Abnormal   Collection Time: 12/26/19  5:35 AM  Result Value Ref Range   WBC 9.2 4.0 - 10.5 K/uL   RBC 2.48 (L) 4.22 - 5.81 MIL/uL   Hemoglobin 7.2 (L) 13.0 - 17.0 g/dL   HCT 22.3 (L) 39.0 - 52.0 %   MCV 89.9 80.0 - 100.0 fL   MCH 29.0 26.0 - 34.0 pg   MCHC 32.3 30.0 - 36.0 g/dL   RDW 17.7 (H) 11.5 - 15.5 %   Platelets 341 150 - 400 K/uL   nRBC 0.2 0.0 - 0.2 %    Comment: Performed at [facility], [place] [address], [place], [place] [address]  Renal function panel     Status: Abnormal   Collection Time: 12/26/19  5:35 AM  Result Value Ref Range   Sodium 127 (L) 135 - 145 mmol/L   Potassium 6.1 (H) 3.5 - 5.1 mmol/L    Comment: SPECIMEN HEMOLYZED. HEMOLYSIS MAY AFFECT INTEGRITY OF RESULTS.   Chloride 85 (L) 98 - 111 mmol/L   CO2 24 22 - 32 mmol/L   Glucose, Bld 102 (H) 70 - 99 mg/dL   BUN 155 (H) 6 - 20 mg/dL   Creatinine, Ser 6.98 (H) 0.61 - 1.24 mg/dL   Calcium 8.9 8.9 - 10.3 mg/dL   Phosphorus 4.9 (H) 2.5 - 4.6 mg/dL   Albumin 2.5 (L) 3.5 - 5.0 g/dL   GFR calc non Af Amer 8 (L) >60 mL/min   GFR calc Af Amer 10 (L) >60 mL/min   Anion gap 18 (H) 5 - 15    Comment: Performed  at [facility], [place] [address] [place], [place] [address]  Magnesium     Status: None   Collection  Time: 12/26/19  5:35 AM  Result Value Ref Range   Magnesium 2.4 1.7 - 2.4 mg/dL    Comment: SPECIMEN HEMOLYZED. HEMOLYSIS MAY AFFECT INTEGRITY OF RESULTS. Performed at [facility], [place] [address], [place], [place] [address]   Hepatitis B surface antibody,qualitative     Status: Abnormal   Collection Time: 12/26/19  2:44 PM  Result Value Ref Range   Hep B S Ab Reactive (A) NON REACTIVE    Comment: (NOTE) Consistent with immunity, greater than 9.9 mIU/mL. Performed at [facility], [place] [address], [place], [place] [address]   Hepatitis B core antibody, total     Status: Abnormal   Collection Time: 12/26/19  2:44 PM  Result Value Ref Range   Hep B Core Total Ab Reactive (A) NON REACTIVE    Comment: Performed at [facility] [address], [place], [place] [address]  Hepatitis B surface antigen     Status: None   Collection Time: 12/26/19  2:46 PM  Result Value Ref Range   Hepatitis B Surface Ag NON REACTIVE NON REACTIVE    Comment: Performed at [facility] [address], [place], [place] [address]  Potassium     Status: Abnormal   Collection Time: 12/26/19  6:41 PM  Result Value Ref Range   Potassium 3.4 (L) 3.5 - 5.1 mmol/L    Comment: Performed at [facility] [address], [place],  [address]   No results found.  Review Of Systems Constitutional: No fever, chills, weight loss or gain. Eyes: No vision change, wears glasses. No discharge or pain. Ears: No hearing loss, No tinnitus. Respiratory: No asthma, COPD, pneumonias. Positive shortness of breath. No hemoptysis. Cardiovascular: Positive chest pain, palpitation, no leg edema. Gastrointestinal: No nausea, vomiting, diarrhea, constipation. No GI bleed. No hepatitis. Genitourinary: No dysuria, hematuria, kidney stone. No incontinance. Positive ESRD Neurological: No headache, stroke, seizures.  Psychiatry: No psych facility admission for anxiety, depression, suicide. No  detox. Skin: No rash. Lower extremities discoloration and wounds up to knees. Musculoskeletal: No joint pain, fibromyalgia. No neck pain, back pain. Lymphadenopathy: No lymphadenopathy. Hematology: Positive anemia,  easy bruising.  P: 80, RR: 20, BP: 90/60, O2 sat 96 % on FiO2 of 30 %. There were no vitals taken for this visit. There is no height or weight on file to calculate BMI. General appearance: alert, cooperative, appears stated age and no distress Head: Normocephalic, atraumatic. Eyes: Brown eyes, pale conjunctiva, corneas clear. PERRL, EOM's intact. Neck: No adenopathy, no carotid bruit, no JVD, supple, symmetrical, trach collar applied, thyroid not enlarged. Resp: Rhonchi to auscultation bilaterally. Cardio: Regular rate and rhythm, S1, S2 normal, II/VI systolic murmur, no click, rub or gallop GI: Soft, non-tender; bowel sounds normal; no organomegaly. Extremities: Trace edema, no cyanosis or clubbing. Severe dry gangrene of both feet + dry wounds around knees. Skin: dry.  Neurologic: Alert and oriented X 3, decreased strength.   Assessment/Plan Acute on chronic respiratory failure with hypoxia Severe LV systolic heart failure, HFrEF ESRD Chronic atrial fibrillation H/O PE Bilateral feet gangrene S/P cardiac arrest x 2 NSVT Severe anemia, blood loss and chronic disease  Continue amiodarone. Will repeat limited echocardiogram for LV function. Prognosis is poor overall.  Time spent: Review of old records, Lab, x-rays, EKG, other cardiac tests, examination, discussion with  patient, nurse over 70 minutes.  Birdie Riddle, MD  12/27/2019, 11:20 PM

## 2019-12-28 ENCOUNTER — Ambulatory Visit (HOSPITAL_COMMUNITY): Payer: Medicare Other | Attending: Cardiovascular Disease

## 2019-12-28 DIAGNOSIS — D631 Anemia in chronic kidney disease: Secondary | ICD-10-CM | POA: Diagnosis not present

## 2019-12-28 DIAGNOSIS — E875 Hyperkalemia: Secondary | ICD-10-CM | POA: Diagnosis not present

## 2019-12-28 DIAGNOSIS — R9431 Abnormal electrocardiogram [ECG] [EKG]: Secondary | ICD-10-CM | POA: Diagnosis not present

## 2019-12-28 DIAGNOSIS — I2699 Other pulmonary embolism without acute cor pulmonale: Secondary | ICD-10-CM | POA: Diagnosis not present

## 2019-12-28 DIAGNOSIS — I4821 Permanent atrial fibrillation: Secondary | ICD-10-CM | POA: Diagnosis not present

## 2019-12-28 DIAGNOSIS — J9621 Acute and chronic respiratory failure with hypoxia: Secondary | ICD-10-CM | POA: Diagnosis not present

## 2019-12-28 DIAGNOSIS — I5021 Acute systolic (congestive) heart failure: Secondary | ICD-10-CM | POA: Diagnosis not present

## 2019-12-28 DIAGNOSIS — I2781 Cor pulmonale (chronic): Secondary | ICD-10-CM | POA: Insufficient documentation

## 2019-12-28 DIAGNOSIS — I313 Pericardial effusion (noninflammatory): Secondary | ICD-10-CM | POA: Insufficient documentation

## 2019-12-28 DIAGNOSIS — N186 End stage renal disease: Secondary | ICD-10-CM | POA: Diagnosis not present

## 2019-12-28 DIAGNOSIS — I081 Rheumatic disorders of both mitral and tricuspid valves: Secondary | ICD-10-CM | POA: Insufficient documentation

## 2019-12-28 DIAGNOSIS — Z992 Dependence on renal dialysis: Secondary | ICD-10-CM | POA: Diagnosis not present

## 2019-12-28 DIAGNOSIS — I959 Hypotension, unspecified: Secondary | ICD-10-CM | POA: Diagnosis not present

## 2019-12-28 DIAGNOSIS — I5043 Acute on chronic combined systolic (congestive) and diastolic (congestive) heart failure: Secondary | ICD-10-CM | POA: Diagnosis not present

## 2019-12-28 DIAGNOSIS — N2581 Secondary hyperparathyroidism of renal origin: Secondary | ICD-10-CM | POA: Diagnosis not present

## 2019-12-28 DIAGNOSIS — J9601 Acute respiratory failure with hypoxia: Secondary | ICD-10-CM | POA: Diagnosis not present

## 2019-12-28 DIAGNOSIS — J9690 Respiratory failure, unspecified, unspecified whether with hypoxia or hypercapnia: Secondary | ICD-10-CM | POA: Diagnosis not present

## 2019-12-28 DIAGNOSIS — I482 Chronic atrial fibrillation, unspecified: Secondary | ICD-10-CM | POA: Diagnosis not present

## 2019-12-28 LAB — RENAL FUNCTION PANEL
Albumin: 2.5 g/dL — ABNORMAL LOW (ref 3.5–5.0)
Anion gap: 14 (ref 5–15)
BUN: 87 mg/dL — ABNORMAL HIGH (ref 6–20)
CO2: 24 mmol/L (ref 22–32)
Calcium: 9 mg/dL (ref 8.9–10.3)
Chloride: 93 mmol/L — ABNORMAL LOW (ref 98–111)
Creatinine, Ser: 5.32 mg/dL — ABNORMAL HIGH (ref 0.61–1.24)
GFR calc Af Amer: 13 mL/min — ABNORMAL LOW (ref >60)
GFR calc non Af Amer: 11 mL/min — ABNORMAL LOW (ref >60)
Glucose, Bld: 109 mg/dL — ABNORMAL HIGH (ref 70–99)
Phosphorus: 3.9 mg/dL (ref 2.5–4.6)
Potassium: 3.9 mmol/L (ref 3.5–5.1)
Sodium: 131 mmol/L — ABNORMAL LOW (ref 135–145)

## 2019-12-28 LAB — PREPARE RBC (CROSSMATCH)

## 2019-12-28 LAB — MAGNESIUM: Magnesium: 2 mg/dL (ref 1.7–2.4)

## 2019-12-28 LAB — CBC
HCT: 22.9 % — ABNORMAL LOW (ref 39.0–52.0)
Hemoglobin: 7 g/dL — ABNORMAL LOW (ref 13.0–17.0)
MCH: 28.3 pg (ref 26.0–34.0)
MCHC: 30.6 g/dL (ref 30.0–36.0)
MCV: 92.7 fL (ref 80.0–100.0)
Platelets: 345 10*3/uL (ref 150–400)
RBC: 2.47 MIL/uL — ABNORMAL LOW (ref 4.22–5.81)
RDW: 17.7 % — ABNORMAL HIGH (ref 11.5–15.5)
WBC: 10.3 10*3/uL (ref 4.0–10.5)
nRBC: 0.4 % — ABNORMAL HIGH (ref 0.0–0.2)

## 2019-12-28 NOTE — Progress Notes (Signed)
[facility]   PULMONARY CRITICAL CARE SERVICE  PROGRESS NOTE  Date of Service: 12/28/2019  [name] [name]  YTK:[id number]  DOB: [birth year]   DOA: 12/24/2019  Referring Physician: Merton Border, MD  HPI: [name] [name] is a 54 y.o. male seen for follow up of Acute on Chronic Respiratory Failure.  Patient currently is on full support on assist control mode has been on 30% FiO2.  Mechanics have been poor patient's not been able to tolerate much in the way of weaning.  Medications: Reviewed on Rounds  Physical Exam:  Vitals: Temperature 97.6 pulse 90 respiratory 28 blood pressure 97/55 saturations 100%  Ventilator Settings mode ventilation assist control FiO2 30% tidal volume 608 PEEP 5  . General: Comfortable at this time . Eyes: Grossly normal lids, irises & conjunctiva . ENT: grossly tongue is normal . Neck: no obvious mass . Cardiovascular: S1 S2 normal no gallop . Respiratory: Scattered rhonchi expansion is equal at this time . Abdomen: soft . Skin: no rash seen on limited exam . Musculoskeletal: not rigid . Psychiatric:unable to assess . Neurologic: no seizure no involuntary movements         Lab Data:   Basic Metabolic Panel: Recent Labs  Lab 12/22/19 0500 12/23/19 0323 12/24/19 0539 12/25/19 0350 12/26/19 0535 12/26/19 1841 12/28/19 0533  NA 135 131* 132* 129* 127*  --  131*  K 4.3 4.6 4.3 4.7 6.1* 3.4* 3.9  CL 95* 92* 91* 88* 85*  --  93*  CO2 27 24 26 23 24   --  24  GLUCOSE 103* 104* 119* 127* 102*  --  109*  BUN 62* 105* 85* 115* 155*  --  87*  CREATININE 4.02* 5.61* 4.75* 5.87* 6.98*  --  5.32*  CALCIUM 8.4* 8.4*  8.6* 8.6* 8.6* 8.9  --  9.0  MG 2.4  --   --  2.3 2.4  --  2.0  PHOS 4.5  --   --  5.1* 4.9*  --  3.9    ABG: Recent Labs  Lab 12/24/19 0145 12/25/19 0030  PHART 7.536* 7.485*  PCO2ART 33.8 33.8  PO2ART 74.0* 95.1  HCO3 28.7* 25.2  O2SAT 96.2 99.1    Liver Function Tests: Recent  Labs  Lab 12/24/19 0539 12/25/19 0350 12/26/19 0535 12/28/19 0533  AST 19  --   --   --   ALT 6  --   --   --   ALKPHOS 249*  --   --   --   BILITOT 0.8  --   --   --   PROT 7.6  --   --   --   ALBUMIN 2.5* 2.4* 2.5* 2.5*   No results for input(s): LIPASE, AMYLASE in the last 168 hours. No results for input(s): AMMONIA in the last 168 hours.  CBC: Recent Labs  Lab 12/23/19 0323 12/24/19 0539 12/25/19 0350 12/26/19 0535 12/28/19 0533  WBC 9.3 10.2 9.9 9.2 10.3  HGB 7.4* 7.5* 7.3* 7.2* 7.0*  HCT 23.1* 24.7* 23.2* 22.3* 22.9*  MCV 91.3 92.2 90.3 89.9 92.7  PLT 319 364 370 341 345    Cardiac Enzymes: No results for input(s): CKTOTAL, CKMB, CKMBINDEX, TROPONINI in the last 168 hours.  BNP (last 3 results) Recent Labs    11/16/19 1356  BNP 962.8*    ProBNP (last 3 results) No results for input(s): PROBNP in the last 8760 hours.  Radiological Exams: No results found.  Assessment/Plan Active Problems:  Acute on chronic respiratory failure with hypoxia (HCC)   End stage renal disease on dialysis (HCC)   Acute on chronic systolic and diastolic heart failure, NYHA class 4 (HCC)   Chronic atrial fibrillation (HCC)   Acute pulmonary embolism without acute cor pulmonale ([place])   1. Acute on chronic respiratory failure with hypoxia plan is to continue with checking the RSB I and the mechanics daily.  Wean as tolerated. 2. End-stage renal disease on hemodialysis we will continue with present management 3. Acute on chronic systolic heart failure compensated we will continue to follow 4. Chronic atrial fibrillation rate now rate is controlled 5. Acute pulmonary embolism with cor pulmonale treated continue to follow along   I have personally seen and evaluated the patient, evaluated laboratory and imaging results, formulated the assessment and plan and placed orders. The Patient requires high complexity decision making with multiple systems involvement.  Rounds were done  with the Respiratory Therapy Director and Staff therapists and discussed with nursing staff also.  Allyne Gee, MD [facility] Pulmonary Critical Care Medicine Sleep Medicine

## 2019-12-28 NOTE — Progress Notes (Signed)
*  2D Echocardiogram has been performed.  [name] [name] 12/28/2019, 2:27 PM

## 2019-12-28 NOTE — Progress Notes (Signed)
[facility]  ROUNDING NOTE   Subjective:  Patient due for hemodialysis today. Still has relative hypotension with blood pressure 97/55. Breathing comfortably through tracheostomy at the moment.   Objective:  Vital signs in last 24 hours:  Temperature 97.6 pulse 90 respirations 25 blood pressure 97/55  Physical Exam: General: Chronically ill-appearing  Head: Normocephalic, atraumatic. Moist oral mucosal membranes  Eyes: Anicteric  Neck: Tracheostomy present  Lungs:  Scattered rhonchi  Heart: S1S2 no rubs  Abdomen:  Soft, nontender, bowel sounds present  Extremities: Trace peripheral edema.  Neurologic: Awake, alert, following commands  Skin: Necrotic appearing second and third toe of left foot  Access: Right IJ temporary dialysis catheter    Basic Metabolic Panel: Recent Labs  Lab 12/22/19 0500 12/23/19 0323 12/24/19 0539 12/25/19 0350 12/26/19 0535 12/26/19 1841 12/28/19 0533  NA 135 131* 132* 129* 127*  --  131*  K 4.3 4.6 4.3 4.7 6.1* 3.4* 3.9  CL 95* 92* 91* 88* 85*  --  93*  CO2 27 24 26 23 24   --  24  GLUCOSE 103* 104* 119* 127* 102*  --  109*  BUN 62* 105* 85* 115* 155*  --  87*  CREATININE 4.02* 5.61* 4.75* 5.87* 6.98*  --  5.32*  CALCIUM 8.4* 8.4*  8.6* 8.6* 8.6* 8.9  --  9.0  MG 2.4  --   --  2.3 2.4  --   --   PHOS 4.5  --   --  5.1* 4.9*  --  3.9    Liver Function Tests: Recent Labs  Lab 12/24/19 0539 12/25/19 0350 12/26/19 0535 12/28/19 0533  AST 19  --   --   --   ALT 6  --   --   --   ALKPHOS 249*  --   --   --   BILITOT 0.8  --   --   --   PROT 7.6  --   --   --   ALBUMIN 2.5* 2.4* 2.5* 2.5*   No results for input(s): LIPASE, AMYLASE in the last 168 hours. No results for input(s): AMMONIA in the last 168 hours.  CBC: Recent Labs  Lab 12/23/19 0323 12/24/19 0539 12/25/19 0350 12/26/19 0535 12/28/19 0533  WBC 9.3 10.2 9.9 9.2 10.3  HGB 7.4* 7.5* 7.3* 7.2* 7.0*  HCT 23.1* 24.7* 23.2* 22.3* 22.9*  MCV 91.3 92.2  90.3 89.9 92.7  PLT 319 364 370 341 345    Cardiac Enzymes: No results for input(s): CKTOTAL, CKMB, CKMBINDEX, TROPONINI in the last 168 hours.  BNP: Invalid input(s): POCBNP  CBG: Recent Labs  Lab 12/23/19 0309 12/23/19 0719 12/23/19 1132 12/23/19 1528 12/23/19 1945  GLUCAP 123* 102* 118* 111* 127*    Microbiology: Results for orders placed or performed during the hospital encounter of 11/16/19  SARS Coronavirus 2 by RT PCR (hospital order, performed in [facility] hospital lab) Nasopharyngeal Nasopharyngeal Swab     Status: None   Collection Time: 11/16/19 11:41 AM   Specimen: Nasopharyngeal Swab  Result Value Ref Range Status   SARS Coronavirus 2 NEGATIVE NEGATIVE Final    Comment: (NOTE) SARS-CoV-2 target nucleic acids are NOT DETECTED. The SARS-CoV-2 RNA is generally detectable in upper and lower respiratory specimens during the acute phase of infection. The lowest concentration of SARS-CoV-2 viral copies this assay can detect is 250 copies / mL. A negative result does not preclude SARS-CoV-2 infection and should not be used as the sole basis for treatment or other  patient management decisions.  A negative result may occur with improper specimen collection / handling, submission of specimen other than nasopharyngeal swab, presence of viral mutation(s) within the areas targeted by this assay, and inadequate number of viral copies (<250 copies / mL). A negative result must be combined with clinical observations, patient history, and epidemiological information. Fact Sheet for Patients:   StrictlyIdeas.no Fact Sheet for Healthcare Providers: BankingDealers.co.za This test is not yet approved or cleared  by the [place] FDA and has been authorized for detection and/or diagnosis of SARS-CoV-2 by FDA under an Emergency Use Authorization (EUA).  This EUA will remain in effect (meaning this test can be used) for the  duration of the COVID-19 declaration under Section 564(b)(1) of the Act, 21 U.S.C. section 360bbb-3(b)(1), unless the authorization is terminated or revoked sooner. Performed at [facility], [place] [address] [place], [place] [address]   Culture, blood (routine x 2)     Status: None   Collection Time: 11/16/19  6:03 PM   Specimen: BLOOD RIGHT HAND  Result Value Ref Range Status   Specimen Description BLOOD RIGHT HAND  Final   Special Requests   Final    BOTTLES DRAWN AEROBIC AND ANAEROBIC Blood Culture results may not be optimal due to an inadequate volume of blood received in culture bottles   Culture   Final    NO GROWTH 5 DAYS Performed at [facility], [place] [address] [place], [place] [address]    Report Status 11/21/2019 FINAL  Final  MRSA PCR Screening     Status: None   Collection Time: 11/16/19  6:05 PM   Specimen: Nasal Mucosa; Nasopharyngeal  Result Value Ref Range Status   MRSA by PCR NEGATIVE NEGATIVE Final    Comment:        The GeneXpert MRSA Assay (FDA approved for NASAL specimens only), is one component of a comprehensive MRSA colonization surveillance program. It is not intended to diagnose MRSA infection nor to guide or monitor treatment for MRSA infections. Performed at [facility], [place] [address], [place], [place] [address]   Culture, blood (routine x 2)     Status: None   Collection Time: 11/16/19  6:08 PM   Specimen: BLOOD RIGHT HAND  Result Value Ref Range Status   Specimen Description BLOOD RIGHT HAND  Final   Special Requests   Final    BOTTLES DRAWN AEROBIC ONLY Blood Culture results may not be optimal due to an inadequate volume of blood received in culture bottles   Culture   Final    NO GROWTH 5 DAYS Performed at [facility], [place] [address], [place], [place] [address]    Report Status 11/21/2019 FINAL  Final  Culture, respiratory (non-expectorated)     Status: None   Collection Time: 11/18/19 12:28 PM   Specimen:  Tracheal Aspirate; Respiratory  Result Value Ref Range Status   Specimen Description TRACHEAL ASPIRATE  Final   Special Requests NONE  Final   Gram Stain   Final    ABUNDANT WBC PRESENT,BOTH PMN AND MONONUCLEAR ABUNDANT GRAM POSITIVE COCCI ABUNDANT GRAM NEGATIVE RODS FEW GRAM VARIABLE ROD    Culture   Final    ABUNDANT HAEMOPHILUS INFLUENZAE BETA LACTAMASE POSITIVE Performed at [facility], [place] [address] [place], [place] [address]    Report Status 11/20/2019 FINAL  Final  Surgical pcr screen     Status: Abnormal   Collection Time: 11/24/19  7:53  AM   Specimen: Nasal Mucosa; Nasal Swab  Result Value Ref Range Status   MRSA, PCR NEGATIVE NEGATIVE Final   Staphylococcus aureus POSITIVE (A) NEGATIVE Final    Comment: (NOTE) The Xpert SA Assay (FDA approved for NASAL specimens in patients 3 years of age and older), is one component of a comprehensive surveillance program. It is not intended to diagnose infection nor to guide or monitor treatment. Performed at [facility], [place] [address] [place], [place] [address]   Culture, blood (routine x 2)     Status: None   Collection Time: 11/25/19  3:30 PM   Specimen: BLOOD  Result Value Ref Range Status   Specimen Description BLOOD RIGHT ANTECUBITAL  Final   Special Requests   Final    BOTTLES DRAWN AEROBIC ONLY Blood Culture results may not be optimal due to an inadequate volume of blood received in culture bottles   Culture   Final    NO GROWTH 5 DAYS Performed at [facility], [place] [address] [place], [place] [address]    Report Status 11/30/2019 FINAL  Final  Culture, blood (routine x 2)     Status: None   Collection Time: 11/25/19  3:30 PM   Specimen: BLOOD RIGHT HAND  Result Value Ref Range Status   Specimen Description BLOOD RIGHT HAND  Final   Special Requests   Final    BOTTLES DRAWN AEROBIC AND ANAEROBIC Blood Culture results may not be optimal due to an inadequate volume of blood received in  culture bottles   Culture   Final    NO GROWTH 5 DAYS Performed at [facility], [place] [address] [place], [place] [address]    Report Status 11/30/2019 FINAL  Final  Culture, respiratory (non-expectorated)     Status: None   Collection Time: 11/25/19  4:35 PM   Specimen: Tracheal Aspirate; Respiratory  Result Value Ref Range Status   Specimen Description TRACHEAL ASPIRATE  Final   Special Requests NONE  Final   Gram Stain   Final    ABUNDANT WBC PRESENT,BOTH PMN AND MONONUCLEAR FEW GRAM POSITIVE COCCI Performed at [facility], [place] [address], [place], [place] [address]    Culture FEW STAPHYLOCOCCUS EPIDERMIDIS  Final   Report Status 11/28/2019 FINAL  Final   Organism ID, Bacteria STAPHYLOCOCCUS EPIDERMIDIS  Final      Susceptibility   Staphylococcus epidermidis - MIC*    CIPROFLOXACIN <=0.5 SENSITIVE Sensitive     ERYTHROMYCIN >=8 RESISTANT Resistant     GENTAMICIN <=0.5 SENSITIVE Sensitive     OXACILLIN >=4 RESISTANT Resistant     TETRACYCLINE <=1 SENSITIVE Sensitive     VANCOMYCIN 2 SENSITIVE Sensitive     TRIMETH/SULFA <=10 SENSITIVE Sensitive     CLINDAMYCIN >=8 RESISTANT Resistant     RIFAMPIN <=0.5 SENSITIVE Sensitive     Inducible Clindamycin NEGATIVE Sensitive     * FEW STAPHYLOCOCCUS EPIDERMIDIS  Culture, respiratory (non-expectorated)     Status: None   Collection Time: 12/15/19  4:12 PM   Specimen: Tracheal Aspirate; Respiratory  Result Value Ref Range Status   Specimen Description TRACHEAL ASPIRATE  Final   Special Requests NONE  Final   Gram Stain   Final    MODERATE WBC PRESENT, PREDOMINANTLY PMN MODERATE GRAM POSITIVE COCCI IN PAIRS IN CLUSTERS Performed at [facility], [address] [place], [place] [address]    Culture MODERATE STAPHYLOCOCCUS AUREUS  Final   Report Status 12/17/2019  FINAL  Final   Organism ID, Bacteria STAPHYLOCOCCUS AUREUS  Final      Susceptibility   Staphylococcus aureus - MIC*    CIPROFLOXACIN <=0.5 SENSITIVE  Sensitive     ERYTHROMYCIN <=0.25 SENSITIVE Sensitive     GENTAMICIN <=0.5 SENSITIVE Sensitive     OXACILLIN <=0.25 SENSITIVE Sensitive     TETRACYCLINE <=1 SENSITIVE Sensitive     VANCOMYCIN 1 SENSITIVE Sensitive     TRIMETH/SULFA <=10 SENSITIVE Sensitive     CLINDAMYCIN <=0.25 SENSITIVE Sensitive     RIFAMPIN <=0.5 SENSITIVE Sensitive     Inducible Clindamycin NEGATIVE Sensitive     * MODERATE STAPHYLOCOCCUS AUREUS  MRSA PCR Screening     Status: None   Collection Time: 12/17/19 10:25 AM   Specimen: Nasopharyngeal  Result Value Ref Range Status   MRSA by PCR NEGATIVE NEGATIVE Final    Comment:        The GeneXpert MRSA Assay (FDA approved for NASAL specimens only), is one component of a comprehensive MRSA colonization surveillance program. It is not intended to diagnose MRSA infection nor to guide or monitor treatment for MRSA infections. Performed at [facility], [place] [address], [place], [place] [address]   Culture, blood (routine x 2)     Status: None   Collection Time: 12/17/19  3:15 PM   Specimen: BLOOD  Result Value Ref Range Status   Specimen Description BLOOD RIGHT ANTECUBITAL  Final   Special Requests   Final    BOTTLES DRAWN AEROBIC AND ANAEROBIC Blood Culture results may not be optimal due to an inadequate volume of blood received in culture bottles   Culture   Final    NO GROWTH 5 DAYS Performed at [facility], [place] [address], [place], [place] [address]    Report Status 12/22/2019 FINAL  Final  Culture, blood (routine x 2)     Status: None   Collection Time: 12/17/19  3:20 PM   Specimen: BLOOD RIGHT HAND  Result Value Ref Range Status   Specimen Description BLOOD RIGHT HAND  Final   Special Requests   Final    BOTTLES DRAWN AEROBIC ONLY Blood Culture results may not be optimal due to an inadequate volume of blood received in culture bottles   Culture   Final    NO GROWTH 5 DAYS Performed at [facility], [place] [address]  [place],  [address]    Report Status 12/22/2019 FINAL  Final    Coagulation Studies: No results for input(s): LABPROT, INR in the last 72 hours.  Urinalysis: No results for input(s): COLORURINE, LABSPEC, PHURINE, GLUCOSEU, HGBUR, BILIRUBINUR, KETONESUR, PROTEINUR, UROBILINOGEN, NITRITE, LEUKOCYTESUR in the last 72 hours.  Invalid input(s): APPERANCEUR    Imaging: No results found.   Medications:       Assessment/ Plan:  54 y.o. male with a PMHx of recent cardiac arrest status post hypothermia protocol, ESRD on HD MWF, anemia of chronic kidney disease, secondary hyperparathyroidism, history of polysubstance abuse, hypertension,, chronic systolic heart failure, medical nonadherence who was admitted to [facility] on 12/24/2019 for ongoing management of acute respiratory failure.  1.  ESRD on HD MWF.  Patient due for hemodialysis today.  Orders have been prepared.  Continue to use albumin for blood pressure support hemodialysis treatment.  2.  Anemia of chronic kidney disease.  Hemoglobin down to 7.  Consider blood transfusion but defer to primary team.  3.  Secondary hyperparathyroidism.  Phosphorus 3.9 and at target.  4.  Hyperkalemia.  Serum potassium corrected down to 3.9.  Continue to monitor.  5.  Hypotension.  Continue to use albumin during dialysis for blood pressure support for now.   LOS: 0 Janene Yousuf 1/13/202111:40 AM

## 2019-12-28 NOTE — Consult Note (Signed)
Ref: Jean Rosenthal, MD   Subjective:  Awake. Mild respiratory distress. Bilateral feet gangrene with ulcers. Severe LV systolic dysfunction.  Objective:  Vital Signs in the last 24 hours:    Physical Exam: BP Readings from Last 1 Encounters:  12/23/19 (!) 122/108     Wt Readings from Last 1 Encounters:  12/22/19 68.6 kg    Weight change:  There is no height or weight on file to calculate BMI. HEENT: [place]/AT, Eyes-Brown, Conjunctiva-Pale, Sclera-Non-icteric Neck: No JVD, No bruit, Trachea midline. Lungs:  Clearing, Bilateral. Cardiac:  Regular rhythm, normal S1 and S2, no S3. II/VI systolic murmur. Abdomen:  Soft, non-tender. BS present. Extremities:  Trace edema, bilateral feet gangrene + ulcers present. No clubbing. CNS: AxOx3, Cranial nerves grossly intact, moves all 4 extremities.  Skin: dry.   Intake/Output from previous day: No intake/output data recorded.    Lab Results: BMET    Component Value Date/Time   NA 131 (L) 12/28/2019 0533   NA 127 (L) 12/26/2019 0535   NA 129 (L) 12/25/2019 0350   K 3.9 12/28/2019 0533   K 3.4 (L) 12/26/2019 1841   K 6.1 (H) 12/26/2019 0535   CL 93 (L) 12/28/2019 0533   CL 85 (L) 12/26/2019 0535   CL 88 (L) 12/25/2019 0350   CO2 24 12/28/2019 0533   CO2 24 12/26/2019 0535   CO2 23 12/25/2019 0350   GLUCOSE 109 (H) 12/28/2019 0533   GLUCOSE 102 (H) 12/26/2019 0535   GLUCOSE 127 (H) 12/25/2019 0350   BUN 87 (H) 12/28/2019 0533   BUN 155 (H) 12/26/2019 0535   BUN 115 (H) 12/25/2019 0350   CREATININE 5.32 (H) 12/28/2019 0533   CREATININE 6.98 (H) 12/26/2019 0535   CREATININE 5.87 (H) 12/25/2019 0350   CALCIUM 9.0 12/28/2019 0533   CALCIUM 8.9 12/26/2019 0535   CALCIUM 8.6 (L) 12/25/2019 0350   CALCIUM 8.6 (L) 12/23/2019 0323   CALCIUM 8.2 (L) 10/21/2010 1420   CALCIUM 8.7 06/24/2009 0542   GFRNONAA 11 (L) 12/28/2019 0533   GFRNONAA 8 (L) 12/26/2019 0535   GFRNONAA 10 (L) 12/25/2019 0350   GFRAA 13 (L) 12/28/2019 0533   GFRAA 10 (L) 12/26/2019 0535   GFRAA 12 (L) 12/25/2019 0350   CBC    Component Value Date/Time   WBC 10.3 12/28/2019 0533   RBC 2.47 (L) 12/28/2019 0533   HGB 7.0 (L) 12/28/2019 0533   HGB 12.3 (L) 12/31/2017 1012   HCT 22.9 (L) 12/28/2019 0533   HCT 37.4 (L) 12/31/2017 1012   PLT 345 12/28/2019 0533   PLT 176 12/31/2017 1012   MCV 92.7 12/28/2019 0533   MCV 99 (H) 12/31/2017 1012   MCH 28.3 12/28/2019 0533   MCHC 30.6 12/28/2019 0533   RDW 17.7 (H) 12/28/2019 0533   RDW 18.8 (H) 12/31/2017 1012   LYMPHSABS 1.1 12/17/2019 1504   LYMPHSABS 1.0 12/31/2017 1012   MONOABS 1.7 (H) 12/17/2019 1504   EOSABS 0.5 12/17/2019 1504   EOSABS 0.1 12/31/2017 1012   BASOSABS 0.1 12/17/2019 1504   BASOSABS 0.0 12/31/2017 1012   HEPATIC Function Panel Recent Labs    12/17/19 0017 12/19/19 0446 12/24/19 0539  PROT 8.1 7.4 7.6   HEMOGLOBIN A1C No components found for: HGA1C,  MPG CARDIAC ENZYMES Lab Results  Component Value Date   CKTOTAL 520 (H) 07/16/2012   CKMB 10.7 (HH) 07/16/2012   TROPONINI 0.22 (HH) 11/13/2017   TROPONINI 0.19 (HH) 11/12/2017   TROPONINI 0.26 (HH) 05/19/2017  BNP No results for input(s): PROBNP in the last 8760 hours. TSH Recent Labs    11/16/19 1730 12/25/19 0350  TSH 7.112* 6.933*   CHOLESTEROL No results for input(s): CHOL in the last 8760 hours.  Scheduled Meds: Continuous Infusions: PRN Meds:.  Assessment/Plan: Acute on chronic respiratory failure with hypoxemia Severe LV systolic left heart failure ESRD Chronic atrial fibrillation H/O PE Bilateral feet gangrene  S/P cardiac arrest x 2 NSVT Severe anemia, due to chronic disease  Limited echo for LV function to guide further therapy.   LOS: 0 days   Time spent including chart review, lab review, examination, discussion with patient and physician : 30 min   Dixie Dials  MD  12/28/2019, 9:46 AM

## 2019-12-29 ENCOUNTER — Other Ambulatory Visit (HOSPITAL_COMMUNITY): Payer: Medicare Other

## 2019-12-29 DIAGNOSIS — I5043 Acute on chronic combined systolic (congestive) and diastolic (congestive) heart failure: Secondary | ICD-10-CM | POA: Diagnosis not present

## 2019-12-29 DIAGNOSIS — I2699 Other pulmonary embolism without acute cor pulmonale: Secondary | ICD-10-CM | POA: Diagnosis not present

## 2019-12-29 DIAGNOSIS — I5040 Unspecified combined systolic (congestive) and diastolic (congestive) heart failure: Secondary | ICD-10-CM | POA: Diagnosis not present

## 2019-12-29 DIAGNOSIS — G9340 Encephalopathy, unspecified: Secondary | ICD-10-CM | POA: Diagnosis not present

## 2019-12-29 DIAGNOSIS — J9601 Acute respiratory failure with hypoxia: Secondary | ICD-10-CM | POA: Diagnosis not present

## 2019-12-29 DIAGNOSIS — J9621 Acute and chronic respiratory failure with hypoxia: Secondary | ICD-10-CM | POA: Diagnosis not present

## 2019-12-29 DIAGNOSIS — J9690 Respiratory failure, unspecified, unspecified whether with hypoxia or hypercapnia: Secondary | ICD-10-CM | POA: Diagnosis not present

## 2019-12-29 DIAGNOSIS — R140 Abdominal distension (gaseous): Secondary | ICD-10-CM | POA: Diagnosis not present

## 2019-12-29 DIAGNOSIS — I5021 Acute systolic (congestive) heart failure: Secondary | ICD-10-CM | POA: Diagnosis not present

## 2019-12-29 DIAGNOSIS — I482 Chronic atrial fibrillation, unspecified: Secondary | ICD-10-CM | POA: Diagnosis not present

## 2019-12-29 DIAGNOSIS — N186 End stage renal disease: Secondary | ICD-10-CM | POA: Diagnosis not present

## 2019-12-29 DIAGNOSIS — I4821 Permanent atrial fibrillation: Secondary | ICD-10-CM | POA: Diagnosis not present

## 2019-12-29 LAB — CBC
HCT: 27.7 % — ABNORMAL LOW (ref 39.0–52.0)
Hemoglobin: 8.5 g/dL — ABNORMAL LOW (ref 13.0–17.0)
MCH: 28.2 pg (ref 26.0–34.0)
MCHC: 30.7 g/dL (ref 30.0–36.0)
MCV: 92 fL (ref 80.0–100.0)
Platelets: 333 10*3/uL (ref 150–400)
RBC: 3.01 MIL/uL — ABNORMAL LOW (ref 4.22–5.81)
RDW: 17.1 % — ABNORMAL HIGH (ref 11.5–15.5)
WBC: 11.5 10*3/uL — ABNORMAL HIGH (ref 4.0–10.5)
nRBC: 0.6 % — ABNORMAL HIGH (ref 0.0–0.2)

## 2019-12-29 LAB — TYPE AND SCREEN
ABO/RH(D): A POS
Antibody Screen: NEGATIVE
Unit division: 0

## 2019-12-29 LAB — BPAM RBC
Blood Product Expiration Date: 202101302359
ISSUE DATE / TIME: 202101131311
Unit Type and Rh: 6200

## 2019-12-29 NOTE — Consult Note (Signed)
Ref: Jean Rosenthal, MD   Subjective:  Resting comfortably. Limited echocardiogram showed moderate pericardial effusion without tamponade.  Objective:  Vital Signs in the last 24 hours: P : 103, R: 20, BP: 123/85, saturation 100 % on 30 % FiO2    Physical Exam: BP Readings from Last 1 Encounters:  12/23/19 (!) 122/108     Wt Readings from Last 1 Encounters:  12/22/19 68.6 kg    Weight change:  There is no height or weight on file to calculate BMI. HEENT: [place]/AT, Eyes-Brown, Conjunctiva-Pale, Sclera-Non-icteric Neck: No JVD, No bruit, Trachea midline. Lungs:  Clearing, Bilateral. Cardiac:  Regular rhythm, normal S1 and S2, no S3. II/VI systolic murmur. Abdomen:  Soft, non-tender. BS present. Extremities:  Trace edema and bilateral feet gangrene as before present. No cyanosis. No clubbing. CNS: AxOx3, Cranial nerves grossly intact, moves all 4 extremities.  Skin: dry.   Intake/Output from previous day: No intake/output data recorded.    Lab Results: BMET    Component Value Date/Time   NA 131 (L) 12/28/2019 0533   NA 127 (L) 12/26/2019 0535   NA 129 (L) 12/25/2019 0350   K 3.9 12/28/2019 0533   K 3.4 (L) 12/26/2019 1841   K 6.1 (H) 12/26/2019 0535   CL 93 (L) 12/28/2019 0533   CL 85 (L) 12/26/2019 0535   CL 88 (L) 12/25/2019 0350   CO2 24 12/28/2019 0533   CO2 24 12/26/2019 0535   CO2 23 12/25/2019 0350   GLUCOSE 109 (H) 12/28/2019 0533   GLUCOSE 102 (H) 12/26/2019 0535   GLUCOSE 127 (H) 12/25/2019 0350   BUN 87 (H) 12/28/2019 0533   BUN 155 (H) 12/26/2019 0535   BUN 115 (H) 12/25/2019 0350   CREATININE 5.32 (H) 12/28/2019 0533   CREATININE 6.98 (H) 12/26/2019 0535   CREATININE 5.87 (H) 12/25/2019 0350   CALCIUM 9.0 12/28/2019 0533   CALCIUM 8.9 12/26/2019 0535   CALCIUM 8.6 (L) 12/25/2019 0350   CALCIUM 8.6 (L) 12/23/2019 0323   CALCIUM 8.2 (L) 10/21/2010 1420   CALCIUM 8.7 06/24/2009 0542   GFRNONAA 11 (L) 12/28/2019 0533   GFRNONAA 8 (L) 12/26/2019 0535    GFRNONAA 10 (L) 12/25/2019 0350   GFRAA 13 (L) 12/28/2019 0533   GFRAA 10 (L) 12/26/2019 0535   GFRAA 12 (L) 12/25/2019 0350   CBC    Component Value Date/Time   WBC 11.5 (H) 12/29/2019 1221   RBC 3.01 (L) 12/29/2019 1221   HGB 8.5 (L) 12/29/2019 1221   HGB 12.3 (L) 12/31/2017 1012   HCT 27.7 (L) 12/29/2019 1221   HCT 37.4 (L) 12/31/2017 1012   PLT 333 12/29/2019 1221   PLT 176 12/31/2017 1012   MCV 92.0 12/29/2019 1221   MCV 99 (H) 12/31/2017 1012   MCH 28.2 12/29/2019 1221   MCHC 30.7 12/29/2019 1221   RDW 17.1 (H) 12/29/2019 1221   RDW 18.8 (H) 12/31/2017 1012   LYMPHSABS 1.1 12/17/2019 1504   LYMPHSABS 1.0 12/31/2017 1012   MONOABS 1.7 (H) 12/17/2019 1504   EOSABS 0.5 12/17/2019 1504   EOSABS 0.1 12/31/2017 1012   BASOSABS 0.1 12/17/2019 1504   BASOSABS 0.0 12/31/2017 1012   HEPATIC Function Panel Recent Labs    12/17/19 0017 12/19/19 0446 12/24/19 0539  PROT 8.1 7.4 7.6   HEMOGLOBIN A1C No components found for: HGA1C,  MPG CARDIAC ENZYMES Lab Results  Component Value Date   CKTOTAL 520 (H) 07/16/2012   CKMB 10.7 (HH) 07/16/2012   TROPONINI 0.22 (  HH) 11/13/2017   TROPONINI 0.19 (HH) 11/12/2017   TROPONINI 0.26 (HH) 05/19/2017   BNP No results for input(s): PROBNP in the last 8760 hours. TSH Recent Labs    11/16/19 1730 12/25/19 0350  TSH 7.112* 6.933*   CHOLESTEROL No results for input(s): CHOL in the last 8760 hours.  Scheduled Meds: Continuous Infusions: PRN Meds:.  Assessment/Plan: Moderate pericardial effusion, new Acute on chronic respiratory failure with hypoxemia Severe LV systolic dysfunction ESRD Chronic atrial fibrillation H/O PE Bilateral feet gangrene S/P cardiac arrest x 2 NSVT Severe anemia of chronic disease  Will try colchicine 0.6 mg. daily. Repeat echocardiogram limited, tomorrow, for pericardial effusion   LOS: 0 days   Time spent including chart review, lab review, examination, discussion with patient,  physician : 30 min   Dixie Dials  MD  12/29/2019, 6:54 PM

## 2019-12-29 NOTE — Progress Notes (Signed)
[facility] [place]   PULMONARY CRITICAL CARE SERVICE  PROGRESS NOTE  Date of Service: 12/29/2019  [name] [name]  ALP:[id number]  DOB: Jul 14, 1966   DOA: 12/24/2019  Referring Physician: Merton Border, MD  HPI: [name] [name] is a 54 y.o. male seen for follow up of Acute on Chronic Respiratory Failure.  Patient currently is on full support on assist control mode has been on 30% FiO2 had some issues with assist control switched over to pressure support Seem to tolerate the pressure support better.  However patient still has significant issues with anxiety.  Patient was attempted on spontaneous breathing trial however did not tolerated  Medications: Reviewed on Rounds  Physical Exam:  Vitals: Temperature 96.4 pulse 103 respiratory rate 20 blood pressure is 123/85 saturations 100%  Ventilator Settings mode ventilation assist control FiO2 30% tidal volume is 384  . General: Comfortable at this time . Eyes: Grossly normal lids, irises & conjunctiva . ENT: grossly tongue is normal . Neck: no obvious mass . Cardiovascular: S1 S2 normal no gallop . Respiratory: No rhonchi no rales are noted at this time . Abdomen: soft . Skin: no rash seen on limited exam . Musculoskeletal: not rigid . Psychiatric:unable to assess . Neurologic: no seizure no involuntary movements         Lab Data:   Basic Metabolic Panel: Recent Labs  Lab 12/23/19 0323 12/23/19 0323 12/24/19 0539 12/25/19 0350 12/26/19 0535 12/26/19 1841 12/28/19 0533  NA 131*  --  132* 129* 127*  --  131*  K 4.6   < > 4.3 4.7 6.1* 3.4* 3.9  CL 92*  --  91* 88* 85*  --  93*  CO2 24  --  26 23 24   --  24  GLUCOSE 104*  --  119* 127* 102*  --  109*  BUN 105*  --  85* 115* 155*  --  87*  CREATININE 5.61*  --  4.75* 5.87* 6.98*  --  5.32*  CALCIUM 8.4*  8.6*  --  8.6* 8.6* 8.9  --  9.0  MG  --   --   --  2.3 2.4  --  2.0  PHOS  --   --   --  5.1* 4.9*  --  3.9   < > = values in  this interval not displayed.    ABG: Recent Labs  Lab 12/24/19 0145 12/25/19 0030  PHART 7.536* 7.485*  PCO2ART 33.8 33.8  PO2ART 74.0* 95.1  HCO3 28.7* 25.2  O2SAT 96.2 99.1    Liver Function Tests: Recent Labs  Lab 12/24/19 0539 12/25/19 0350 12/26/19 0535 12/28/19 0533  AST 19  --   --   --   ALT 6  --   --   --   ALKPHOS 249*  --   --   --   BILITOT 0.8  --   --   --   PROT 7.6  --   --   --   ALBUMIN 2.5* 2.4* 2.5* 2.5*   No results for input(s): LIPASE, AMYLASE in the last 168 hours. No results for input(s): AMMONIA in the last 168 hours.  CBC: Recent Labs  Lab 12/23/19 0323 12/24/19 0539 12/25/19 0350 12/26/19 0535 12/28/19 0533  WBC 9.3 10.2 9.9 9.2 10.3  HGB 7.4* 7.5* 7.3* 7.2* 7.0*  HCT 23.1* 24.7* 23.2* 22.3* 22.9*  MCV 91.3 92.2 90.3 89.9 92.7  PLT 319 364 370 341 345    Cardiac Enzymes: No  results for input(s): CKTOTAL, CKMB, CKMBINDEX, TROPONINI in the last 168 hours.  BNP (last 3 results) Recent Labs    11/16/19 1356  BNP 962.8*    ProBNP (last 3 results) No results for input(s): PROBNP in the last 8760 hours.  Radiological Exams: CT HEAD WO CONTRAST  Result Date: 12/29/2019 CLINICAL DATA:  54 year old male with encephalopathy. Tracheostomy. EXAM: CT HEAD WITHOUT CONTRAST TECHNIQUE: Contiguous axial images were obtained from the base of the skull through the vertex without intravenous contrast. COMPARISON:  Head CT 11/22/2019. FINDINGS: Brain: Cerebral volume is stable and within normal limits for age. No midline shift, ventriculomegaly, mass effect, evidence of mass lesion, intracranial hemorrhage or evidence of cortically based acute infarction. Gray-white matter differentiation is within normal limits throughout the brain. No encephalomalacia identified. Vascular: Mild Calcified atherosclerosis at the skull base. No suspicious intracranial vascular hyperdensity. Skull: No acute osseous abnormality identified. Sinuses/Orbits: Increased  tympanic cavity and mastoid opacification since last month. Left petrous apex remains pneumatized. No mastoid coalescence identified. Improved paranasal sinus aeration. Left nasoenteric tube remains in place. Other: Fluid in the pharynx. No acute orbit or scalp soft tissue finding. IMPRESSION: 1. Stable and normal for age non contrast CT appearance of the brain. 2. Increased bilateral middle ear and mastoid opacification, probably related to tracheostomy/fluid in the pharynx. 3. Left nasoenteric tube in place. Electronically Signed   By: Genevie Ann M.D.   On: 12/29/2019 02:51   ECHOCARDIOGRAM LIMITED  Result Date: 12/28/2019   ECHOCARDIOGRAM LIMITED REPORT   Patient Name:   [name] [name] Date of Exam: 12/28/2019 Medical Rec #:  [id number]      Height:       70.5 in Accession #:    [id number]     Weight:       151.2 lb Date of Birth:  Apr 20, 1966      BSA:          1.86 m Patient Age:    5 years       BP:           122/108 mmHg Patient Gender: M              HR:           84 bpm. Exam Location:  Inpatient  Procedure: Limited Echo Indications:     CHF  History:         Patient has prior history of Echocardiogram examinations, most                  recent 11/17/2019. ESRD, Cor pulmonale, dialysis, vent. support,                  resp. failure.  Sonographer:     Dustin Flock RDCS Referring Phys:  [place] Diagnosing Phys: Dixie Dials MD IMPRESSIONS  1. Left ventricular ejection fraction, by visual estimation, is 20 to 25%. The left ventricle has severely decreased function. Left ventricular septal wall thickness was mildly increased. Mildly increased left ventricular posterior wall thickness. There  is mildly increased left ventricular wall thickness.  2. The left ventricle demonstrates global hypokinesis.  3. Global right ventricle has moderately reduced systolic function.  4. Left atrial size was severely dilated.  5. Right atrial size was severely dilated.  6. Moderate pericardial effusion.  7. The  pericardial effusion is circumferential and new.  8. There is excessive respiratory variation in the hepatic vein spectral Doppler velocities.  9. The mitral valve is grossly normal. Moderate to  severe mitral valve regurgitation. 10. The tricuspid valve was grossly normal. Tricuspid valve regurgitation moderate-severe. 11. Tricuspid valve regurgitation moderate-severe. 12. The inferior vena cava is dilated in size with <50% respiratory variability, suggesting right atrial pressure of 15 mmHg. FINDINGS  Left Ventricle: Left ventricular ejection fraction, by visual estimation, is 20 to 25%. The left ventricle has severely decreased function. The left ventricle demonstrates global hypokinesis. The left ventricular internal cavity size was the left ventricle is normal in size. Left ventricular septal wall thickness was mildly increased. Mildly increased left ventricular posterior wall thickness. There is mildly increased left ventricular wall thickness. Right Ventricle: Global RV systolic function is has moderately reduced systolic function. Left Atrium: Left atrial size was severely dilated. Right Atrium: Right atrial size was severely dilated. Pericardium: A moderately sized pericardial effusion is present is seen. A moderately sized pericardial effusion is present. The pericardial effusion is circumferential and new. There is excessive respiratory variation in the hepatic vein spectral Doppler velocities. There is no evidence of cardiac tamponade. Mitral Valve: The mitral valve is grossly normal. Moderate to severe mitral valve regurgitation. Tricuspid Valve: The tricuspid valve is grossly normal. Tricuspid valve regurgitation moderate-severe. Aortic Valve: The aortic valve is tricuspid. Pulmonic Valve: The pulmonic valve was grossly normal. Venous: The inferior vena cava is dilated in size with less than 50% respiratory variability, suggesting right atrial pressure of 15 mmHg.  LEFT VENTRICLE         Normals PLAX 2D  LVIDd:         5.70 cm 3.6 cm LV PW:         1.10 cm 1.4 cm LV IVS:        1.30 cm 1.3 cm  LV Volumes (MOD)             Normals LV area d, A4C:    36.60 cm LV area s, A4C:    32.40 cm LV major d, A4C:   8.16 cm LV major s, A4C:   7.76 cm LV vol d, MOD A4C: 135.0 ml LV vol s, MOD A4C: 113.0 ml LV SV MOD A4C:     135.0 ml  Dixie Dials MD Electronically signed by Dixie Dials MD Signature Date/Time: 12/28/2019/7:04:51 PMThe mitral valve is grossly normal.    Final     Assessment/Plan Active Problems:   Acute on chronic respiratory failure with hypoxia (HCC)   End stage renal disease on dialysis (HCC)   Acute on chronic systolic and diastolic heart failure, NYHA class 4 (HCC)   Chronic atrial fibrillation (HCC)   Acute pulmonary embolism without acute cor pulmonale (HCC)   1. Acute on chronic respiratory failure with hypoxia patient continues on full support on assist control mode currently 30% FiO2.  During rounds with assist over to pressure support we will see how the patient is able to tolerate.  The patient does well plan is to continue to advance the weaning. 2. End-stage renal disease on hemodialysis 3. Acute on chronic systolic heart failure we will continue to monitor fluid status patient is being dialyzed 4. Chronic atrial fibrillation rate controlled 5. Pulmonary embolism treated supportive care   I have personally seen and evaluated the patient, evaluated laboratory and imaging results, formulated the assessment and plan and placed orders. The Patient requires high complexity decision making with multiple systems involvement.  Rounds were done with the Respiratory Therapy Director and Staff therapists and discussed with nursing staff also.  Allyne Gee, MD [facility] Pulmonary Critical Care Medicine  Sleep Medicine

## 2019-12-30 ENCOUNTER — Other Ambulatory Visit (HOSPITAL_COMMUNITY): Payer: Medicare Other

## 2019-12-30 DIAGNOSIS — I482 Chronic atrial fibrillation, unspecified: Secondary | ICD-10-CM | POA: Diagnosis not present

## 2019-12-30 DIAGNOSIS — J9601 Acute respiratory failure with hypoxia: Secondary | ICD-10-CM | POA: Diagnosis not present

## 2019-12-30 DIAGNOSIS — I2699 Other pulmonary embolism without acute cor pulmonale: Secondary | ICD-10-CM | POA: Diagnosis not present

## 2019-12-30 DIAGNOSIS — Z9911 Dependence on respirator [ventilator] status: Secondary | ICD-10-CM | POA: Diagnosis not present

## 2019-12-30 DIAGNOSIS — Z992 Dependence on renal dialysis: Secondary | ICD-10-CM | POA: Diagnosis not present

## 2019-12-30 DIAGNOSIS — N2581 Secondary hyperparathyroidism of renal origin: Secondary | ICD-10-CM | POA: Diagnosis not present

## 2019-12-30 DIAGNOSIS — I5043 Acute on chronic combined systolic (congestive) and diastolic (congestive) heart failure: Secondary | ICD-10-CM | POA: Diagnosis not present

## 2019-12-30 DIAGNOSIS — I959 Hypotension, unspecified: Secondary | ICD-10-CM | POA: Diagnosis not present

## 2019-12-30 DIAGNOSIS — J9690 Respiratory failure, unspecified, unspecified whether with hypoxia or hypercapnia: Secondary | ICD-10-CM | POA: Diagnosis not present

## 2019-12-30 DIAGNOSIS — N186 End stage renal disease: Secondary | ICD-10-CM | POA: Diagnosis not present

## 2019-12-30 DIAGNOSIS — I4821 Permanent atrial fibrillation: Secondary | ICD-10-CM | POA: Diagnosis not present

## 2019-12-30 DIAGNOSIS — J9621 Acute and chronic respiratory failure with hypoxia: Secondary | ICD-10-CM | POA: Diagnosis not present

## 2019-12-30 DIAGNOSIS — D631 Anemia in chronic kidney disease: Secondary | ICD-10-CM | POA: Diagnosis not present

## 2019-12-30 DIAGNOSIS — I5021 Acute systolic (congestive) heart failure: Secondary | ICD-10-CM | POA: Diagnosis not present

## 2019-12-30 DIAGNOSIS — I5040 Unspecified combined systolic (congestive) and diastolic (congestive) heart failure: Secondary | ICD-10-CM | POA: Diagnosis not present

## 2019-12-30 LAB — CBC
HCT: 28.4 % — ABNORMAL LOW (ref 39.0–52.0)
Hemoglobin: 8.8 g/dL — ABNORMAL LOW (ref 13.0–17.0)
MCH: 28.8 pg (ref 26.0–34.0)
MCHC: 31 g/dL (ref 30.0–36.0)
MCV: 92.8 fL (ref 80.0–100.0)
Platelets: 324 10*3/uL (ref 150–400)
RBC: 3.06 MIL/uL — ABNORMAL LOW (ref 4.22–5.81)
RDW: 17.1 % — ABNORMAL HIGH (ref 11.5–15.5)
WBC: 9.8 10*3/uL (ref 4.0–10.5)
nRBC: 0.3 % — ABNORMAL HIGH (ref 0.0–0.2)

## 2019-12-30 LAB — RENAL FUNCTION PANEL
Albumin: 2.7 g/dL — ABNORMAL LOW (ref 3.5–5.0)
Anion gap: 17 — ABNORMAL HIGH (ref 5–15)
BUN: 68 mg/dL — ABNORMAL HIGH (ref 6–20)
CO2: 24 mmol/L (ref 22–32)
Calcium: 9.4 mg/dL (ref 8.9–10.3)
Chloride: 95 mmol/L — ABNORMAL LOW (ref 98–111)
Creatinine, Ser: 5.35 mg/dL — ABNORMAL HIGH (ref 0.61–1.24)
GFR calc Af Amer: 13 mL/min — ABNORMAL LOW (ref >60)
GFR calc non Af Amer: 11 mL/min — ABNORMAL LOW (ref >60)
Glucose, Bld: 104 mg/dL — ABNORMAL HIGH (ref 70–99)
Phosphorus: 3.8 mg/dL (ref 2.5–4.6)
Potassium: 3.7 mmol/L (ref 3.5–5.1)
Sodium: 136 mmol/L (ref 135–145)

## 2019-12-30 NOTE — Progress Notes (Signed)
  Echocardiogram 2D Echocardiogram has been performed.  [name] [name] 12/30/2019, 8:53 AM

## 2019-12-30 NOTE — Progress Notes (Signed)
Pulmonary [place]   PULMONARY CRITICAL CARE SERVICE  PROGRESS NOTE  Date of Service: 12/30/2019  [name] [name]  NWG:[id number]  DOB: Jul 28, 1966   DOA: 12/24/2019  Referring Physician: Merton Border, MD  HPI: [name] [name] is a 54 y.o. male seen for follow up of Acute on Chronic Respiratory Failure.  Patient is on full support on the ventilator.  He is having periodic increased agitation.  Seen by cardiology echocardiogram was performed results of which are pending at this time.  If there is any cardiac issues I think after those are taken care of we should be able to fly wean.  Nephrology also saw the patient for his renal failure  Medications: Reviewed on Rounds  Physical Exam:  Vitals: Temperature 96.8 pulse 90 respiratory 22 blood pressure 100/72 saturations 100%  Ventilator Settings on assist control FiO2 30% tidal volume 610 PEEP 5  . General: Comfortable at this time . Eyes: Grossly normal lids, irises & conjunctiva . ENT: grossly tongue is normal . Neck: no obvious mass . Cardiovascular: S1 S2 normal no gallop . Respiratory: No rhonchi no rales are noted at this time . Abdomen: soft . Skin: no rash seen on limited exam . Musculoskeletal: not rigid . Psychiatric:unable to assess . Neurologic: no seizure no involuntary movements         Lab Data:   Basic Metabolic Panel: Recent Labs  Lab 12/24/19 0539 12/24/19 0539 12/25/19 0350 12/26/19 0535 12/26/19 1841 12/28/19 0533 12/30/19 0351  NA 132*  --  129* 127*  --  131* 136  K 4.3   < > 4.7 6.1* 3.4* 3.9 3.7  CL 91*  --  88* 85*  --  93* 95*  CO2 26  --  23 24  --  24 24  GLUCOSE 119*  --  127* 102*  --  109* 104*  BUN 85*  --  115* 155*  --  87* 68*  CREATININE 4.75*  --  5.87* 6.98*  --  5.32* 5.35*  CALCIUM 8.6*  --  8.6* 8.9  --  9.0 9.4  MG  --   --  2.3 2.4  --  2.0  --   PHOS  --   --  5.1* 4.9*  --  3.9 3.8   < > = values in this interval not displayed.     ABG: Recent Labs  Lab 12/24/19 0145 12/25/19 0030  PHART 7.536* 7.485*  PCO2ART 33.8 33.8  PO2ART 74.0* 95.1  HCO3 28.7* 25.2  O2SAT 96.2 99.1    Liver Function Tests: Recent Labs  Lab 12/24/19 0539 12/25/19 0350 12/26/19 0535 12/28/19 0533 12/30/19 0351  AST 19  --   --   --   --   ALT 6  --   --   --   --   ALKPHOS 249*  --   --   --   --   BILITOT 0.8  --   --   --   --   PROT 7.6  --   --   --   --   ALBUMIN 2.5* 2.4* 2.5* 2.5* 2.7*   No results for input(s): LIPASE, AMYLASE in the last 168 hours. No results for input(s): AMMONIA in the last 168 hours.  CBC: Recent Labs  Lab 12/25/19 0350 12/26/19 0535 12/28/19 0533 12/29/19 1221 12/30/19 0351  WBC 9.9 9.2 10.3 11.5* 9.8  HGB 7.3* 7.2* 7.0* 8.5* 8.8*  HCT 23.2* 22.3* 22.9* 27.7* 28.4*  MCV 90.3 89.9 92.7 92.0 92.8  PLT 370 341 345 333 324    Cardiac Enzymes: No results for input(s): CKTOTAL, CKMB, CKMBINDEX, TROPONINI in the last 168 hours.  BNP (last 3 results) Recent Labs    11/16/19 1356  BNP 962.8*    ProBNP (last 3 results) No results for input(s): PROBNP in the last 8760 hours.  Radiological Exams: DG Abd 1 View  Result Date: 12/29/2019 CLINICAL DATA:  Abdominal distension. EXAM: ABDOMEN - 1 VIEW COMPARISON:  December 24, 2019. FINDINGS: The bowel gas pattern is normal. Distal tip of feeding tube is seen in expected position of second portion of duodenum. No radio-opaque calculi or other significant radiographic abnormality are seen. IMPRESSION: No evidence of bowel obstruction or ileus. Distal tip of feeding tube is seen in expected position of second portion of duodenum. Electronically Signed   By: Marijo Conception M.D.   On: 12/29/2019 15:15   CT HEAD WO CONTRAST  Result Date: 12/29/2019 CLINICAL DATA:  54 year old male with encephalopathy. Tracheostomy. EXAM: CT HEAD WITHOUT CONTRAST TECHNIQUE: Contiguous axial images were obtained from the base of the skull through the vertex without  intravenous contrast. COMPARISON:  Head CT 11/22/2019. FINDINGS: Brain: Cerebral volume is stable and within normal limits for age. No midline shift, ventriculomegaly, mass effect, evidence of mass lesion, intracranial hemorrhage or evidence of cortically based acute infarction. Gray-white matter differentiation is within normal limits throughout the brain. No encephalomalacia identified. Vascular: Mild Calcified atherosclerosis at the skull base. No suspicious intracranial vascular hyperdensity. Skull: No acute osseous abnormality identified. Sinuses/Orbits: Increased tympanic cavity and mastoid opacification since last month. Left petrous apex remains pneumatized. No mastoid coalescence identified. Improved paranasal sinus aeration. Left nasoenteric tube remains in place. Other: Fluid in the pharynx. No acute orbit or scalp soft tissue finding. IMPRESSION: 1. Stable and normal for age non contrast CT appearance of the brain. 2. Increased bilateral middle ear and mastoid opacification, probably related to tracheostomy/fluid in the pharynx. 3. Left nasoenteric tube in place. Electronically Signed   By: Genevie Ann M.D.   On: 12/29/2019 02:51   ECHOCARDIOGRAM LIMITED  Result Date: 12/28/2019   ECHOCARDIOGRAM LIMITED REPORT   Patient Name:   [name] [name] Date of Exam: 12/28/2019 Medical Rec #:  [id number]      Height:       70.5 in Accession #:    [id number]     Weight:       151.2 lb Date of Birth:  February 03, 1966      BSA:          1.86 m Patient Age:    59 years       BP:           122/108 mmHg Patient Gender: M              HR:           84 bpm. Exam Location:  Inpatient  Procedure: Limited Echo Indications:     CHF  History:         Patient has prior history of Echocardiogram examinations, most                  recent 11/17/2019. ESRD, Cor pulmonale, dialysis, vent. support,                  resp. failure.  Sonographer:     Dustin Flock RDCS Referring Phys:  [place] Diagnosing Phys: Dixie Dials MD  IMPRESSIONS  1.  Left ventricular ejection fraction, by visual estimation, is 20 to 25%. The left ventricle has severely decreased function. Left ventricular septal wall thickness was mildly increased. Mildly increased left ventricular posterior wall thickness. There  is mildly increased left ventricular wall thickness.  2. The left ventricle demonstrates global hypokinesis.  3. Global right ventricle has moderately reduced systolic function.  4. Left atrial size was severely dilated.  5. Right atrial size was severely dilated.  6. Moderate pericardial effusion.  7. The pericardial effusion is circumferential and new.  8. There is excessive respiratory variation in the hepatic vein spectral Doppler velocities.  9. The mitral valve is grossly normal. Moderate to severe mitral valve regurgitation. 10. The tricuspid valve was grossly normal. Tricuspid valve regurgitation moderate-severe. 11. Tricuspid valve regurgitation moderate-severe. 12. The inferior vena cava is dilated in size with <50% respiratory variability, suggesting right atrial pressure of 15 mmHg. FINDINGS  Left Ventricle: Left ventricular ejection fraction, by visual estimation, is 20 to 25%. The left ventricle has severely decreased function. The left ventricle demonstrates global hypokinesis. The left ventricular internal cavity size was the left ventricle is normal in size. Left ventricular septal wall thickness was mildly increased. Mildly increased left ventricular posterior wall thickness. There is mildly increased left ventricular wall thickness. Right Ventricle: Global RV systolic function is has moderately reduced systolic function. Left Atrium: Left atrial size was severely dilated. Right Atrium: Right atrial size was severely dilated. Pericardium: A moderately sized pericardial effusion is present is seen. A moderately sized pericardial effusion is present. The pericardial effusion is circumferential and new. There is excessive respiratory  variation in the hepatic vein spectral Doppler velocities. There is no evidence of cardiac tamponade. Mitral Valve: The mitral valve is grossly normal. Moderate to severe mitral valve regurgitation. Tricuspid Valve: The tricuspid valve is grossly normal. Tricuspid valve regurgitation moderate-severe. Aortic Valve: The aortic valve is tricuspid. Pulmonic Valve: The pulmonic valve was grossly normal. Venous: The inferior vena cava is dilated in size with less than 50% respiratory variability, suggesting right atrial pressure of 15 mmHg.  LEFT VENTRICLE         Normals PLAX 2D LVIDd:         5.70 cm 3.6 cm LV PW:         1.10 cm 1.4 cm LV IVS:        1.30 cm 1.3 cm  LV Volumes (MOD)             Normals LV area d, A4C:    36.60 cm LV area s, A4C:    32.40 cm LV major d, A4C:   8.16 cm LV major s, A4C:   7.76 cm LV vol d, MOD A4C: 135.0 ml LV vol s, MOD A4C: 113.0 ml LV SV MOD A4C:     135.0 ml  Dixie Dials MD Electronically signed by Dixie Dials MD Signature Date/Time: 12/28/2019/7:04:51 PMThe mitral valve is grossly normal.    Final     Assessment/Plan Active Problems:   Acute on chronic respiratory failure with hypoxia (HCC)   End stage renal disease on dialysis (HCC)   Acute on chronic systolic and diastolic heart failure, NYHA class 4 (HCC)   Chronic atrial fibrillation (HCC)   Acute pulmonary embolism without acute cor pulmonale (HCC)   1. Acute on chronic respiratory failure with hypoxia patient continues on full support assist control mode will have respiratory therapy switch her over to pressure support if able to tolerate today. 2. End-stage renal disease on  hemodialysis we will continue with present management 3. Acute on chronic systolic and diastolic heart failure compensated 4. Chronic atrial fibrillation rate is controlled at this time 5. Pulmonary embolism treated we will continue with supportive care monitor closely   I have personally seen and evaluated the patient, evaluated  laboratory and imaging results, formulated the assessment and plan and placed orders. The Patient requires high complexity decision making with multiple systems involvement.  Rounds were done with the Respiratory Therapy Director and Staff therapists and discussed with nursing staff also.  Allyne Gee, MD [facility] Pulmonary Critical Care Medicine Sleep Medicine

## 2019-12-30 NOTE — Progress Notes (Signed)
[facility]  ROUNDING NOTE   Subjective:  Patient seen at bedside. Still on the ventilator. Appears to have a pericardial effusion. Echocardiogram to be performed today.   Objective:  Vital signs in last 24 hours:  Temperature 96.8 pulse 90 respirations 22 blood pressure 100/72  Physical Exam: General: Chronically ill-appearing  Head: Normocephalic, atraumatic. Moist oral mucosal membranes  Eyes: Anicteric  Neck: Tracheostomy present  Lungs:  Scattered rhonchi  Heart: S1S2 no rubs  Abdomen:  Soft, nontender, bowel sounds present  Extremities: Trace peripheral edema.  Neurologic: Awake, alert, following commands  Skin: Necrotic appearing second and third toe of left foot  Access: Right IJ temporary dialysis catheter    Basic Metabolic Panel: Recent Labs  Lab 12/24/19 0539 12/24/19 0539 12/25/19 0350 12/25/19 0350 12/26/19 0535 12/26/19 1841 12/28/19 0533 12/30/19 0351  NA 132*  --  129*  --  127*  --  131* 136  K 4.3   < > 4.7  --  6.1* 3.4* 3.9 3.7  CL 91*  --  88*  --  85*  --  93* 95*  CO2 26  --  23  --  24  --  24 24  GLUCOSE 119*  --  127*  --  102*  --  109* 104*  BUN 85*  --  115*  --  155*  --  87* 68*  CREATININE 4.75*  --  5.87*  --  6.98*  --  5.32* 5.35*  CALCIUM 8.6*   < > 8.6*   < > 8.9  --  9.0 9.4  MG  --   --  2.3  --  2.4  --  2.0  --   PHOS  --   --  5.1*  --  4.9*  --  3.9 3.8   < > = values in this interval not displayed.    Liver Function Tests: Recent Labs  Lab 12/24/19 0539 12/25/19 0350 12/26/19 0535 12/28/19 0533 12/30/19 0351  AST 19  --   --   --   --   ALT 6  --   --   --   --   ALKPHOS 249*  --   --   --   --   BILITOT 0.8  --   --   --   --   PROT 7.6  --   --   --   --   ALBUMIN 2.5* 2.4* 2.5* 2.5* 2.7*   No results for input(s): LIPASE, AMYLASE in the last 168 hours. No results for input(s): AMMONIA in the last 168 hours.  CBC: Recent Labs  Lab 12/25/19 0350 12/26/19 0535 12/28/19 0533  12/29/19 1221 12/30/19 0351  WBC 9.9 9.2 10.3 11.5* 9.8  HGB 7.3* 7.2* 7.0* 8.5* 8.8*  HCT 23.2* 22.3* 22.9* 27.7* 28.4*  MCV 90.3 89.9 92.7 92.0 92.8  PLT 370 341 345 333 324    Cardiac Enzymes: No results for input(s): CKTOTAL, CKMB, CKMBINDEX, TROPONINI in the last 168 hours.  BNP: Invalid input(s): POCBNP  CBG: Recent Labs  Lab 12/23/19 1132 12/23/19 1528 12/23/19 1945  GLUCAP 118* 111* 127*    Microbiology: Results for orders placed or performed during the hospital encounter of 11/16/19  SARS Coronavirus 2 by RT PCR (hospital order, performed in [facility] hospital lab) Nasopharyngeal Nasopharyngeal Swab     Status: None   Collection Time: 11/16/19 11:41 AM   Specimen: Nasopharyngeal Swab  Result Value Ref Range Status   SARS Coronavirus  2 NEGATIVE NEGATIVE Final    Comment: (NOTE) SARS-CoV-2 target nucleic acids are NOT DETECTED. The SARS-CoV-2 RNA is generally detectable in upper and lower respiratory specimens during the acute phase of infection. The lowest concentration of SARS-CoV-2 viral copies this assay can detect is 250 copies / mL. A negative result does not preclude SARS-CoV-2 infection and should not be used as the sole basis for treatment or other patient management decisions.  A negative result may occur with improper specimen collection / handling, submission of specimen other than nasopharyngeal swab, presence of viral mutation(s) within the areas targeted by this assay, and inadequate number of viral copies (<250 copies / mL). A negative result must be combined with clinical observations, patient history, and epidemiological information. Fact Sheet for Patients:   StrictlyIdeas.no Fact Sheet for Healthcare Providers: BankingDealers.co.za This test is not yet approved or cleared  by the [place] FDA and has been authorized for detection and/or diagnosis of SARS-CoV-2 by FDA under an Emergency  Use Authorization (EUA).  This EUA will remain in effect (meaning this test can be used) for the duration of the COVID-19 declaration under Section 564(b)(1) of the Act, 21 U.S.C. section 360bbb-3(b)(1), unless the authorization is terminated or revoked sooner. Performed at [facility], [place] [address], [place], [place] [address]   Culture, blood (routine x 2)     Status: None   Collection Time: 11/16/19  6:03 PM   Specimen: BLOOD RIGHT HAND  Result Value Ref Range Status   Specimen Description BLOOD RIGHT HAND  Final   Special Requests   Final    BOTTLES DRAWN AEROBIC AND ANAEROBIC Blood Culture results may not be optimal due to an inadequate volume of blood received in culture bottles   Culture   Final    NO GROWTH 5 DAYS Performed at [facility], [place] [address] [place], [place] [address]    Report Status 11/21/2019 FINAL  Final  MRSA PCR Screening     Status: None   Collection Time: 11/16/19  6:05 PM   Specimen: Nasal Mucosa; Nasopharyngeal  Result Value Ref Range Status   MRSA by PCR NEGATIVE NEGATIVE Final    Comment:        The GeneXpert MRSA Assay (FDA approved for NASAL specimens only), is one component of a comprehensive MRSA colonization surveillance program. It is not intended to diagnose MRSA infection nor to guide or monitor treatment for MRSA infections. Performed at [facility], [place] [address] [place], [place] [address]   Culture, blood (routine x 2)     Status: None   Collection Time: 11/16/19  6:08 PM   Specimen: BLOOD RIGHT HAND  Result Value Ref Range Status   Specimen Description BLOOD RIGHT HAND  Final   Special Requests   Final    BOTTLES DRAWN AEROBIC ONLY Blood Culture results may not be optimal due to an inadequate volume of blood received in culture bottles   Culture   Final    NO GROWTH 5 DAYS Performed at [facility], [place] [address] [place],  [address]    Report Status 11/21/2019 FINAL  Final  Culture,  respiratory (non-expectorated)     Status: None   Collection Time: 11/18/19 12:28 PM   Specimen: Tracheal Aspirate; Respiratory  Result Value Ref Range Status   Specimen Description TRACHEAL ASPIRATE  Final   Special Requests NONE  Final   Gram Stain   Final    ABUNDANT WBC  PRESENT,BOTH PMN AND MONONUCLEAR ABUNDANT GRAM POSITIVE COCCI ABUNDANT GRAM NEGATIVE RODS FEW GRAM VARIABLE ROD    Culture   Final    ABUNDANT HAEMOPHILUS INFLUENZAE BETA LACTAMASE POSITIVE Performed at [facility], [place] [address] [place], [place] [address]    Report Status 11/20/2019 FINAL  Final  Surgical pcr screen     Status: Abnormal   Collection Time: 11/24/19  7:53 AM   Specimen: Nasal Mucosa; Nasal Swab  Result Value Ref Range Status   MRSA, PCR NEGATIVE NEGATIVE Final   Staphylococcus aureus POSITIVE (A) NEGATIVE Final    Comment: (NOTE) The Xpert SA Assay (FDA approved for NASAL specimens in patients 87 years of age and older), is one component of a comprehensive surveillance program. It is not intended to diagnose infection nor to guide or monitor treatment. Performed at [facility], [place] [address], [place], [place] [address]   Culture, blood (routine x 2)     Status: None   Collection Time: 11/25/19  3:30 PM   Specimen: BLOOD  Result Value Ref Range Status   Specimen Description BLOOD RIGHT ANTECUBITAL  Final   Special Requests   Final    BOTTLES DRAWN AEROBIC ONLY Blood Culture results may not be optimal due to an inadequate volume of blood received in culture bottles   Culture   Final    NO GROWTH 5 DAYS Performed at [facility], [place] [address], [place], [place] [address]    Report Status 11/30/2019 FINAL  Final  Culture, blood (routine x 2)     Status: None   Collection Time: 11/25/19  3:30 PM   Specimen: BLOOD RIGHT HAND  Result Value Ref Range Status   Specimen Description BLOOD RIGHT HAND  Final   Special Requests   Final    BOTTLES DRAWN AEROBIC AND  ANAEROBIC Blood Culture results may not be optimal due to an inadequate volume of blood received in culture bottles   Culture   Final    NO GROWTH 5 DAYS Performed at [facility], [place] [address] [place], [place] [address]    Report Status 11/30/2019 FINAL  Final  Culture, respiratory (non-expectorated)     Status: None   Collection Time: 11/25/19  4:35 PM   Specimen: Tracheal Aspirate; Respiratory  Result Value Ref Range Status   Specimen Description TRACHEAL ASPIRATE  Final   Special Requests NONE  Final   Gram Stain   Final    ABUNDANT WBC PRESENT,BOTH PMN AND MONONUCLEAR FEW GRAM POSITIVE COCCI Performed at [facility], [place] [address] [place], [place] [address]    Culture FEW STAPHYLOCOCCUS EPIDERMIDIS  Final   Report Status 11/28/2019 FINAL  Final   Organism ID, Bacteria STAPHYLOCOCCUS EPIDERMIDIS  Final      Susceptibility   Staphylococcus epidermidis - MIC*    CIPROFLOXACIN <=0.5 SENSITIVE Sensitive     ERYTHROMYCIN >=8 RESISTANT Resistant     GENTAMICIN <=0.5 SENSITIVE Sensitive     OXACILLIN >=4 RESISTANT Resistant     TETRACYCLINE <=1 SENSITIVE Sensitive     VANCOMYCIN 2 SENSITIVE Sensitive     TRIMETH/SULFA <=10 SENSITIVE Sensitive     CLINDAMYCIN >=8 RESISTANT Resistant     RIFAMPIN <=0.5 SENSITIVE Sensitive     Inducible Clindamycin NEGATIVE Sensitive     * FEW STAPHYLOCOCCUS EPIDERMIDIS  Culture, respiratory (non-expectorated)     Status: None   Collection Time: 12/15/19  4:12 PM   Specimen: Tracheal Aspirate; Respiratory  Result Value Ref Range Status   Specimen Description TRACHEAL ASPIRATE  Final   Special Requests NONE  Final   Gram Stain   Final    MODERATE WBC PRESENT, PREDOMINANTLY PMN MODERATE GRAM POSITIVE COCCI IN PAIRS IN CLUSTERS Performed at [facility], [address] [place], [place] [address]    Culture MODERATE STAPHYLOCOCCUS AUREUS  Final   Report Status 12/17/2019 FINAL  Final   Organism ID, Bacteria STAPHYLOCOCCUS  AUREUS  Final      Susceptibility   Staphylococcus aureus - MIC*    CIPROFLOXACIN <=0.5 SENSITIVE Sensitive     ERYTHROMYCIN <=0.25 SENSITIVE Sensitive     GENTAMICIN <=0.5 SENSITIVE Sensitive     OXACILLIN <=0.25 SENSITIVE Sensitive     TETRACYCLINE <=1 SENSITIVE Sensitive     VANCOMYCIN 1 SENSITIVE Sensitive     TRIMETH/SULFA <=10 SENSITIVE Sensitive     CLINDAMYCIN <=0.25 SENSITIVE Sensitive     RIFAMPIN <=0.5 SENSITIVE Sensitive     Inducible Clindamycin NEGATIVE Sensitive     * MODERATE STAPHYLOCOCCUS AUREUS  MRSA PCR Screening     Status: None   Collection Time: 12/17/19 10:25 AM   Specimen: Nasopharyngeal  Result Value Ref Range Status   MRSA by PCR NEGATIVE NEGATIVE Final    Comment:        The GeneXpert MRSA Assay (FDA approved for NASAL specimens only), is one component of a comprehensive MRSA colonization surveillance program. It is not intended to diagnose MRSA infection nor to guide or monitor treatment for MRSA infections. Performed at [facility], [place] [address] [place], [place] [address]   Culture, blood (routine x 2)     Status: None   Collection Time: 12/17/19  3:15 PM   Specimen: BLOOD  Result Value Ref Range Status   Specimen Description BLOOD RIGHT ANTECUBITAL  Final   Special Requests   Final    BOTTLES DRAWN AEROBIC AND ANAEROBIC Blood Culture results may not be optimal due to an inadequate volume of blood received in culture bottles   Culture   Final    NO GROWTH 5 DAYS Performed at [facility], [place] [address] [place], [place] [address]    Report Status 12/22/2019 FINAL  Final  Culture, blood (routine x 2)     Status: None   Collection Time: 12/17/19  3:20 PM   Specimen: BLOOD RIGHT HAND  Result Value Ref Range Status   Specimen Description BLOOD RIGHT HAND  Final   Special Requests   Final    BOTTLES DRAWN AEROBIC ONLY Blood Culture results may not be optimal due to an inadequate volume of blood received in culture bottles    Culture   Final    NO GROWTH 5 DAYS Performed at [facility], [place] [address] [place], [place] [address]    Report Status 12/22/2019 FINAL  Final    Coagulation Studies: No results for input(s): LABPROT, INR in the last 72 hours.  Urinalysis: No results for input(s): COLORURINE, LABSPEC, PHURINE, GLUCOSEU, HGBUR, BILIRUBINUR, KETONESUR, PROTEINUR, UROBILINOGEN, NITRITE, LEUKOCYTESUR in the last 72 hours.  Invalid input(s): APPERANCEUR    Imaging: DG Abd 1 View  Result Date: 12/29/2019 CLINICAL DATA:  Abdominal distension. EXAM: ABDOMEN - 1 VIEW COMPARISON:  December 24, 2019. FINDINGS: The bowel gas pattern is normal. Distal tip of feeding tube is seen in expected position of second portion of duodenum. No radio-opaque calculi or other significant radiographic abnormality are seen. IMPRESSION: No evidence of  bowel obstruction or ileus. Distal tip of feeding tube is seen in expected position of second portion of duodenum. Electronically Signed   By: Marijo Conception M.D.   On: 12/29/2019 15:15   CT HEAD WO CONTRAST  Result Date: 12/29/2019 CLINICAL DATA:  54 year old male with encephalopathy. Tracheostomy. EXAM: CT HEAD WITHOUT CONTRAST TECHNIQUE: Contiguous axial images were obtained from the base of the skull through the vertex without intravenous contrast. COMPARISON:  Head CT 11/22/2019. FINDINGS: Brain: Cerebral volume is stable and within normal limits for age. No midline shift, ventriculomegaly, mass effect, evidence of mass lesion, intracranial hemorrhage or evidence of cortically based acute infarction. Gray-white matter differentiation is within normal limits throughout the brain. No encephalomalacia identified. Vascular: Mild Calcified atherosclerosis at the skull base. No suspicious intracranial vascular hyperdensity. Skull: No acute osseous abnormality identified. Sinuses/Orbits: Increased tympanic cavity and mastoid opacification since last month. Left petrous apex remains  pneumatized. No mastoid coalescence identified. Improved paranasal sinus aeration. Left nasoenteric tube remains in place. Other: Fluid in the pharynx. No acute orbit or scalp soft tissue finding. IMPRESSION: 1. Stable and normal for age non contrast CT appearance of the brain. 2. Increased bilateral middle ear and mastoid opacification, probably related to tracheostomy/fluid in the pharynx. 3. Left nasoenteric tube in place. Electronically Signed   By: Genevie Ann M.D.   On: 12/29/2019 02:51   ECHOCARDIOGRAM LIMITED  Result Date: 12/28/2019   ECHOCARDIOGRAM LIMITED REPORT   Patient Name:   [name] [name] Date of Exam: 12/28/2019 Medical Rec #:  [id number]      Height:       70.5 in Accession #:    [id number]     Weight:       151.2 lb Date of Birth:  Dec 05, 1966      BSA:          1.86 m Patient Age:    54 years       BP:           122/108 mmHg Patient Gender: M              HR:           84 bpm. Exam Location:  Inpatient  Procedure: Limited Echo Indications:     CHF  History:         Patient has prior history of Echocardiogram examinations, most                  recent 11/17/2019. ESRD, Cor pulmonale, dialysis, vent. support,                  resp. failure.  Sonographer:     Dustin Flock RDCS Referring Phys:  [place] Diagnosing Phys: Dixie Dials MD IMPRESSIONS  1. Left ventricular ejection fraction, by visual estimation, is 20 to 25%. The left ventricle has severely decreased function. Left ventricular septal wall thickness was mildly increased. Mildly increased left ventricular posterior wall thickness. There  is mildly increased left ventricular wall thickness.  2. The left ventricle demonstrates global hypokinesis.  3. Global right ventricle has moderately reduced systolic function.  4. Left atrial size was severely dilated.  5. Right atrial size was severely dilated.  6. Moderate pericardial effusion.  7. The pericardial effusion is circumferential and new.  8. There is excessive respiratory  variation in the hepatic vein spectral Doppler velocities.  9. The mitral valve is grossly normal. Moderate to severe mitral valve regurgitation. 10. The tricuspid valve was grossly normal.  Tricuspid valve regurgitation moderate-severe. 11. Tricuspid valve regurgitation moderate-severe. 12. The inferior vena cava is dilated in size with <50% respiratory variability, suggesting right atrial pressure of 15 mmHg. FINDINGS  Left Ventricle: Left ventricular ejection fraction, by visual estimation, is 20 to 25%. The left ventricle has severely decreased function. The left ventricle demonstrates global hypokinesis. The left ventricular internal cavity size was the left ventricle is normal in size. Left ventricular septal wall thickness was mildly increased. Mildly increased left ventricular posterior wall thickness. There is mildly increased left ventricular wall thickness. Right Ventricle: Global RV systolic function is has moderately reduced systolic function. Left Atrium: Left atrial size was severely dilated. Right Atrium: Right atrial size was severely dilated. Pericardium: A moderately sized pericardial effusion is present is seen. A moderately sized pericardial effusion is present. The pericardial effusion is circumferential and new. There is excessive respiratory variation in the hepatic vein spectral Doppler velocities. There is no evidence of cardiac tamponade. Mitral Valve: The mitral valve is grossly normal. Moderate to severe mitral valve regurgitation. Tricuspid Valve: The tricuspid valve is grossly normal. Tricuspid valve regurgitation moderate-severe. Aortic Valve: The aortic valve is tricuspid. Pulmonic Valve: The pulmonic valve was grossly normal. Venous: The inferior vena cava is dilated in size with less than 50% respiratory variability, suggesting right atrial pressure of 15 mmHg.  LEFT VENTRICLE         Normals PLAX 2D LVIDd:         5.70 cm 3.6 cm LV PW:         1.10 cm 1.4 cm LV IVS:        1.30 cm  1.3 cm  LV Volumes (MOD)             Normals LV area d, A4C:    36.60 cm LV area s, A4C:    32.40 cm LV major d, A4C:   8.16 cm LV major s, A4C:   7.76 cm LV vol d, MOD A4C: 135.0 ml LV vol s, MOD A4C: 113.0 ml LV SV MOD A4C:     135.0 ml  Dixie Dials MD Electronically signed by Dixie Dials MD Signature Date/Time: 12/28/2019/7:04:51 PMThe mitral valve is grossly normal.    Final      Medications:       Assessment/ Plan:  54 y.o. male with a PMHx of recent cardiac arrest status post hypothermia protocol, ESRD on HD MWF, anemia of chronic kidney disease, secondary hyperparathyroidism, history of polysubstance abuse, hypertension,, chronic systolic heart failure, medical nonadherence who was admitted to [facility] on 12/24/2019 for ongoing management of acute respiratory failure.  1.  ESRD on HD MWF.  We will maintain the patient on hemodialysis on MWF schedule.  Orders have been prepared.  Continue to use albumin for blood pressure support.  2.  Anemia of chronic kidney disease.  Hemoglobin up to 8.8 posttransfusion.  Maintain the patient on Retacrit.  3.  Secondary hyperparathyroidism.  Phosphorus checked today and was 3.8 and at target.  4.  Hyperkalemia.  Resolved post dialysis.  Continue to monitor potassium.  5.  Hypotension.  Continue albumin during dialysis treatments.   LOS: 0 [name] [name] 1/15/20218:20 AM

## 2019-12-31 DIAGNOSIS — J9621 Acute and chronic respiratory failure with hypoxia: Secondary | ICD-10-CM | POA: Diagnosis not present

## 2019-12-31 DIAGNOSIS — Z992 Dependence on renal dialysis: Secondary | ICD-10-CM | POA: Diagnosis not present

## 2019-12-31 DIAGNOSIS — Z9911 Dependence on respirator [ventilator] status: Secondary | ICD-10-CM | POA: Diagnosis not present

## 2019-12-31 DIAGNOSIS — I959 Hypotension, unspecified: Secondary | ICD-10-CM | POA: Diagnosis not present

## 2019-12-31 DIAGNOSIS — N186 End stage renal disease: Secondary | ICD-10-CM | POA: Diagnosis not present

## 2019-12-31 DIAGNOSIS — J9690 Respiratory failure, unspecified, unspecified whether with hypoxia or hypercapnia: Secondary | ICD-10-CM | POA: Diagnosis not present

## 2019-12-31 DIAGNOSIS — I5043 Acute on chronic combined systolic (congestive) and diastolic (congestive) heart failure: Secondary | ICD-10-CM | POA: Diagnosis not present

## 2019-12-31 DIAGNOSIS — I2699 Other pulmonary embolism without acute cor pulmonale: Secondary | ICD-10-CM | POA: Diagnosis not present

## 2019-12-31 DIAGNOSIS — I482 Chronic atrial fibrillation, unspecified: Secondary | ICD-10-CM | POA: Diagnosis not present

## 2019-12-31 NOTE — Progress Notes (Addendum)
[facility]   PULMONARY CRITICAL CARE SERVICE  PROGRESS NOTE  Date of Service: 12/31/2019  [name] [name]  [id number]  DOB: 13-Nov-1966   DOA: 12/24/2019  Referring Physician: Merton Border, MD  HPI: [name] [name] is a 54 y.o. male seen for follow up of Acute on Chronic Respiratory Failure.  Patient had a goal of 4 hours on pressure support today was only able to tolerate 3 before he had to be placed back on full support on the ventilator.  Patient was very anxious.  Medications: Reviewed on Rounds  Physical Exam:  Vitals: Pulse 81 respirations 20 BP 95/55 O2 sat 100% temp 98.2  Ventilator Settings ventilator mode AC VC rate of 20 tidal volume 590 PEEP of 5 and FiO2 40%  . General: Comfortable at this time . Eyes: Grossly normal lids, irises & conjunctiva . ENT: grossly tongue is normal . Neck: no obvious mass . Cardiovascular: S1 S2 normal no gallop . Respiratory: No rales or rhonchi noted . Abdomen: soft . Skin: no rash seen on limited exam . Musculoskeletal: not rigid . Psychiatric:unable to assess . Neurologic: no seizure no involuntary movements         Lab Data:   Basic Metabolic Panel: Recent Labs  Lab 12/25/19 0350 12/26/19 0535 12/26/19 1841 12/28/19 0533 12/30/19 0351  NA 129* 127*  --  131* 136  K 4.7 6.1* 3.4* 3.9 3.7  CL 88* 85*  --  93* 95*  CO2 23 24  --  24 24  GLUCOSE 127* 102*  --  109* 104*  BUN 115* 155*  --  87* 68*  CREATININE 5.87* 6.98*  --  5.32* 5.35*  CALCIUM 8.6* 8.9  --  9.0 9.4  MG 2.3 2.4  --  2.0  --   PHOS 5.1* 4.9*  --  3.9 3.8    ABG: Recent Labs  Lab 12/25/19 0030  PHART 7.485*  PCO2ART 33.8  PO2ART 95.1  HCO3 25.2  O2SAT 99.1    Liver Function Tests: Recent Labs  Lab 12/25/19 0350 12/26/19 0535 12/28/19 0533 12/30/19 0351  ALBUMIN 2.4* 2.5* 2.5* 2.7*   No results for input(s): LIPASE, AMYLASE in the last 168 hours. No results for input(s): AMMONIA in  the last 168 hours.  CBC: Recent Labs  Lab 12/25/19 0350 12/26/19 0535 12/28/19 0533 12/29/19 1221 12/30/19 0351  WBC 9.9 9.2 10.3 11.5* 9.8  HGB 7.3* 7.2* 7.0* 8.5* 8.8*  HCT 23.2* 22.3* 22.9* 27.7* 28.4*  MCV 90.3 89.9 92.7 92.0 92.8  PLT 370 341 345 333 324    Cardiac Enzymes: No results for input(s): CKTOTAL, CKMB, CKMBINDEX, TROPONINI in the last 168 hours.  BNP (last 3 results) Recent Labs    11/16/19 1356  BNP 962.8*    ProBNP (last 3 results) No results for input(s): PROBNP in the last 8760 hours.  Radiological Exams: ECHOCARDIOGRAM LIMITED  Result Date: 12/30/2019   ECHOCARDIOGRAM LIMITED REPORT   Patient Name:   [name] [name] Date of Exam: 12/30/2019 Medical Rec #:  [id number]      Height:       70.5 in Accession #:    [id number]     Weight:       151.2 lb Date of Birth:  1966-05-29      BSA:          1.86 m Patient Age:    45 years       BP:  107/66 mmHg Patient Gender: M              HR:           82 bpm. Exam Location:  Inpatient  Procedure: Limited Echo and Limited Color Doppler Indications:     I31.3 Pericardial effusion (noninflammatory)  History:         Patient has prior history of Echocardiogram examinations, most                  recent 12/28/2019. ESRD. Polysubstance abuse.  Sonographer:     Jonelle Sidle Dance Referring Phys:  [place] Diagnosing Phys: Dixie Dials MD  Sonographer Comments: Echo performed with patient supine and on artificial respirator. IMPRESSIONS  1. Left ventricular ejection fraction, by visual estimation, is 30 to 35%. The left ventricle has moderately decreased function. Left ventricular septal wall thickness was normal.  2. Left ventricular diastolic parameters are indeterminate.  3. Mildly dilated left ventricular internal cavity size.  4. The left ventricle demonstrates global hypokinesis.  5. Global right ventricle has severely reduced systolic function.The right ventricular size is mildly enlarged. no increase in right  ventricular wall thickness.  6. Left atrial size was moderately dilated.  7. Right atrial size was mildly dilated.  8. Moderate pericardial effusion.  9. The pericardial effusion is circumferential. 10. Mild mitral valve regurgitation. 11. The tricuspid valve was normal in structure. Tricuspid valve regurgitation is mild. 12. Tricuspid valve regurgitation is mild. 13. Mildly elevated pulmonary artery systolic pressure. 14. The inferior vena cava is dilated in size with <50% respiratory variability, suggesting right atrial pressure of 15 mmHg. FINDINGS  Left Ventricle: Left ventricular ejection fraction, by visual estimation, is 30 to 35%. The left ventricle has moderately decreased function. The left ventricle demonstrates global hypokinesis. The left ventricular internal cavity size was mildly dilated left ventricle. Left ventricular septal wall thickness was normal. Left ventricular diastolic parameters are indeterminate. Right Ventricle: The right ventricular size is mildly enlarged. No increase in right ventricular wall thickness. Global RV systolic function is has severely reduced systolic function. Left Atrium: Left atrial size was moderately dilated. Right Atrium: Right atrial size was mildly dilated. Right atrial pressure is estimated at 8 mmHg. Pericardium: A moderately sized pericardial effusion is present is seen. A moderately sized pericardial effusion is present. The pericardial effusion is circumferential. There is no evidence of cardiac tamponade. There is a small pleural effusion. Mitral Valve: Mild mitral valve regurgitation. Tricuspid Valve: The tricuspid valve is normal in structure. Tricuspid valve regurgitation is mild. Aortic Valve: The aortic valve is tricuspid. Aortic valve regurgitation is trivial. Pulmonic Valve: The pulmonic valve was grossly normal. Pulmonic valve regurgitation is trivial by color flow Doppler. Pulmonic regurgitation is trivial by color flow Doppler. Aorta: The aortic  root and ascending aorta are structurally normal, with no evidence of dilitation. Venous: The inferior vena cava is dilated in size with less than 50% respiratory variability, suggesting right atrial pressure of 15 mmHg.  LEFT VENTRICLE          Normals PLAX 2D LVIDd:         5.60 cm  3.6 cm LVIDs:         3.90 cm  1.7 cm LV PW:         0.90 cm  1.4 cm LV IVS:        0.80 cm  1.3 cm LVOT diam:     2.00 cm  2.0 cm LV SV:  88 ml    79 ml LV SV Index:   47.60    45 ml/m2 LVOT Area:     3.14 cm 3.14 cm2  RIGHT VENTRICLE          IVC RV Basal diam:  3.50 cm  IVC diam: 2.80 cm RV Mid diam:    3.00 cm LEFT ATRIUM              Index       RIGHT ATRIUM           Index LA diam:        5.00 cm  2.68 cm/m  RA Area:     21.50 cm LA Vol (A2C):   107.0 ml 57.42 ml/m RA Volume:   67.20 ml  36.06 ml/m LA Vol (A4C):   105.0 ml 56.35 ml/m LA Biplane Vol: 107.0 ml 57.42 ml/m   AORTA                 Normals Ao Root diam: 3.40 cm 31 mm Ao Asc diam:  3.30 cm 31 mm  SHUNTS Systemic Diam: 2.00 cm  Dixie Dials MD Electronically signed by Dixie Dials MD Signature Date/Time: 12/30/2019/11:03:35 AM    Final     Assessment/Plan Active Problems:   Acute on chronic respiratory failure with hypoxia (HCC)   End stage renal disease on dialysis (HCC)   Acute on chronic systolic and diastolic heart failure, NYHA class 4 (HCC)   Chronic atrial fibrillation (HCC)   Acute pulmonary embolism without acute cor pulmonale (HCC)   1. Acute on chronic respiratory failure with hypoxia patient continue on full support at this time.  Discussed with RT that patient would likely need some anxiolytic medication for trial to see if this would help him stay on pressure support longer.  Continue supportive measures and pulmonary toilet continue to wean per protocol. 2. End-stage renal disease on hemodialysis we will continue with present management 3. Acute on chronic systolic and diastolic heart failure compensated 4. Chronic atrial  fibrillation rate is controlled at this time 5. Pulmonary embolism treated we will continue with supportive care monitor closely   I have personally seen and evaluated the patient, evaluated laboratory and imaging results, formulated the assessment and plan and placed orders. The Patient requires high complexity decision making with multiple systems involvement.  Rounds were done with the Respiratory Therapy Director and Staff therapists and discussed with nursing staff also.  Allyne Gee, MD [facility] Pulmonary Critical Care Medicine Sleep Medicine

## 2020-01-01 ENCOUNTER — Other Ambulatory Visit (HOSPITAL_COMMUNITY): Payer: Medicare Other

## 2020-01-01 DIAGNOSIS — R188 Other ascites: Secondary | ICD-10-CM | POA: Diagnosis not present

## 2020-01-01 DIAGNOSIS — I959 Hypotension, unspecified: Secondary | ICD-10-CM | POA: Diagnosis not present

## 2020-01-01 DIAGNOSIS — Z992 Dependence on renal dialysis: Secondary | ICD-10-CM | POA: Diagnosis not present

## 2020-01-01 DIAGNOSIS — Z9911 Dependence on respirator [ventilator] status: Secondary | ICD-10-CM | POA: Diagnosis not present

## 2020-01-01 DIAGNOSIS — J9690 Respiratory failure, unspecified, unspecified whether with hypoxia or hypercapnia: Secondary | ICD-10-CM | POA: Diagnosis not present

## 2020-01-01 DIAGNOSIS — I5043 Acute on chronic combined systolic (congestive) and diastolic (congestive) heart failure: Secondary | ICD-10-CM | POA: Diagnosis not present

## 2020-01-01 DIAGNOSIS — N186 End stage renal disease: Secondary | ICD-10-CM | POA: Diagnosis not present

## 2020-01-01 DIAGNOSIS — I2699 Other pulmonary embolism without acute cor pulmonale: Secondary | ICD-10-CM | POA: Diagnosis not present

## 2020-01-01 DIAGNOSIS — J9621 Acute and chronic respiratory failure with hypoxia: Secondary | ICD-10-CM | POA: Diagnosis not present

## 2020-01-01 DIAGNOSIS — I482 Chronic atrial fibrillation, unspecified: Secondary | ICD-10-CM | POA: Diagnosis not present

## 2020-01-01 NOTE — Progress Notes (Signed)
Pulmonary [place]   PULMONARY CRITICAL CARE SERVICE  PROGRESS NOTE  Date of Service: 01/01/2020  [name] [name]  [id number]  DOB: August 07, 1966   DOA: 12/24/2019  Referring Physician: Merton Border, MD  HPI: [name] [name] is a 54 y.o. male seen for follow up of Acute on Chronic Respiratory Failure.  Patient is on full support on pressure support mode at this time on 40% FiO2's requiring a pressure of 18/5  Medications: Reviewed on Rounds  Physical Exam:  Vitals: Temperature 96.6 pulse 86 respiratory 20 blood pressure 116/84 saturations 100%  Ventilator Settings mode ventilation pressure support FiO2 40% tidal volume 454 pressure support 18 PEEP 5  . General: Comfortable at this time . Eyes: Grossly normal lids, irises & conjunctiva . ENT: grossly tongue is normal . Neck: no obvious mass . Cardiovascular: S1 S2 normal no gallop . Respiratory: No rhonchi coarse breath sounds are noted . Abdomen: soft . Skin: no rash seen on limited exam . Musculoskeletal: not rigid . Psychiatric:unable to assess . Neurologic: no seizure no involuntary movements         Lab Data:   Basic Metabolic Panel: Recent Labs  Lab 12/26/19 0535 12/26/19 1841 12/28/19 0533 12/30/19 0351  NA 127*  --  131* 136  K 6.1* 3.4* 3.9 3.7  CL 85*  --  93* 95*  CO2 24  --  24 24  GLUCOSE 102*  --  109* 104*  BUN 155*  --  87* 68*  CREATININE 6.98*  --  5.32* 5.35*  CALCIUM 8.9  --  9.0 9.4  MG 2.4  --  2.0  --   PHOS 4.9*  --  3.9 3.8    ABG: No results for input(s): PHART, PCO2ART, PO2ART, HCO3, O2SAT in the last 168 hours.  Liver Function Tests: Recent Labs  Lab 12/26/19 0535 12/28/19 0533 12/30/19 0351  ALBUMIN 2.5* 2.5* 2.7*   No results for input(s): LIPASE, AMYLASE in the last 168 hours. No results for input(s): AMMONIA in the last 168 hours.  CBC: Recent Labs  Lab 12/26/19 0535 12/28/19 0533 12/29/19 1221 12/30/19 0351  WBC 9.2  10.3 11.5* 9.8  HGB 7.2* 7.0* 8.5* 8.8*  HCT 22.3* 22.9* 27.7* 28.4*  MCV 89.9 92.7 92.0 92.8  PLT 341 345 333 324    Cardiac Enzymes: No results for input(s): CKTOTAL, CKMB, CKMBINDEX, TROPONINI in the last 168 hours.  BNP (last 3 results) Recent Labs    11/16/19 1356  BNP 962.8*    ProBNP (last 3 results) No results for input(s): PROBNP in the last 8760 hours.  Radiological Exams: No results found.  Assessment/Plan Active Problems:   Acute on chronic respiratory failure with hypoxia (HCC)   End stage renal disease on dialysis (HCC)   Acute on chronic systolic and diastolic heart failure, NYHA class 4 (HCC)   Chronic atrial fibrillation (HCC)   Acute pulmonary embolism without acute cor pulmonale ([place])   1. Acute on chronic respiratory failure hypoxia plan is to continue with full support on pressure support mode the goal is for 4 hours today on the wean 2. End-stage renal disease on hemodialysis followed by nephrology 3. Acute on chronic systolic diastolic heart failure echocardiogram was done EF of 30 to 35%. 4. Monitor fluid status closely. 5. Chronic atrial fibrillation rate is controlled at this time 6. Acute pulmonary embolism supportive care we will continue to monitor closely.   I have personally seen and evaluated the patient, evaluated  laboratory and imaging results, formulated the assessment and plan and placed orders. The Patient requires high complexity decision making with multiple systems involvement.  Time 35 minutes Rounds were done with the Respiratory Therapy Director and Staff therapists and discussed with nursing staff also.  Allyne Gee, MD [facility] Pulmonary Critical Care Medicine Sleep Medicine

## 2020-01-02 DIAGNOSIS — Z992 Dependence on renal dialysis: Secondary | ICD-10-CM | POA: Diagnosis not present

## 2020-01-02 DIAGNOSIS — I2699 Other pulmonary embolism without acute cor pulmonale: Secondary | ICD-10-CM | POA: Diagnosis not present

## 2020-01-02 DIAGNOSIS — I5043 Acute on chronic combined systolic (congestive) and diastolic (congestive) heart failure: Secondary | ICD-10-CM | POA: Diagnosis not present

## 2020-01-02 DIAGNOSIS — N2581 Secondary hyperparathyroidism of renal origin: Secondary | ICD-10-CM | POA: Diagnosis not present

## 2020-01-02 DIAGNOSIS — Z9911 Dependence on respirator [ventilator] status: Secondary | ICD-10-CM | POA: Diagnosis not present

## 2020-01-02 DIAGNOSIS — J9690 Respiratory failure, unspecified, unspecified whether with hypoxia or hypercapnia: Secondary | ICD-10-CM | POA: Diagnosis not present

## 2020-01-02 DIAGNOSIS — N186 End stage renal disease: Secondary | ICD-10-CM | POA: Diagnosis not present

## 2020-01-02 DIAGNOSIS — I482 Chronic atrial fibrillation, unspecified: Secondary | ICD-10-CM | POA: Diagnosis not present

## 2020-01-02 DIAGNOSIS — I959 Hypotension, unspecified: Secondary | ICD-10-CM | POA: Diagnosis not present

## 2020-01-02 DIAGNOSIS — J9621 Acute and chronic respiratory failure with hypoxia: Secondary | ICD-10-CM | POA: Diagnosis not present

## 2020-01-02 DIAGNOSIS — D631 Anemia in chronic kidney disease: Secondary | ICD-10-CM | POA: Diagnosis not present

## 2020-01-02 LAB — CBC
HCT: 27.1 % — ABNORMAL LOW (ref 39.0–52.0)
Hemoglobin: 8.2 g/dL — ABNORMAL LOW (ref 13.0–17.0)
MCH: 27.6 pg (ref 26.0–34.0)
MCHC: 30.3 g/dL (ref 30.0–36.0)
MCV: 91.2 fL (ref 80.0–100.0)
Platelets: 283 10*3/uL (ref 150–400)
RBC: 2.97 MIL/uL — ABNORMAL LOW (ref 4.22–5.81)
RDW: 16.8 % — ABNORMAL HIGH (ref 11.5–15.5)
WBC: 7.8 10*3/uL (ref 4.0–10.5)
nRBC: 0 % (ref 0.0–0.2)

## 2020-01-02 LAB — RENAL FUNCTION PANEL
Albumin: 2.7 g/dL — ABNORMAL LOW (ref 3.5–5.0)
Anion gap: 16 — ABNORMAL HIGH (ref 5–15)
BUN: 102 mg/dL — ABNORMAL HIGH (ref 6–20)
CO2: 25 mmol/L (ref 22–32)
Calcium: 9.5 mg/dL (ref 8.9–10.3)
Chloride: 96 mmol/L — ABNORMAL LOW (ref 98–111)
Creatinine, Ser: 6.63 mg/dL — ABNORMAL HIGH (ref 0.61–1.24)
GFR calc Af Amer: 10 mL/min — ABNORMAL LOW (ref >60)
GFR calc non Af Amer: 9 mL/min — ABNORMAL LOW (ref >60)
Glucose, Bld: 94 mg/dL (ref 70–99)
Phosphorus: 3.4 mg/dL (ref 2.5–4.6)
Potassium: 4.2 mmol/L (ref 3.5–5.1)
Sodium: 137 mmol/L (ref 135–145)

## 2020-01-02 NOTE — Progress Notes (Signed)
[facility]  ROUNDING NOTE   Subjective:  Patient seen and evaluated during dialysis treatment. Tolerating well. Norepinephrine being used during dialysis treatment to keep blood pressure up.   Objective:  Vital signs in last 24 hours:  Temperature 97.8 pulse 85 respirations 20 blood pressure 107/72  Physical Exam: General: Chronically ill-appearing  Head: Normocephalic, atraumatic. Moist oral mucosal membranes  Eyes: Anicteric  Neck: Tracheostomy present  Lungs:  Scattered rhonchi  Heart: S1S2 no rubs  Abdomen:  Soft, nontender, bowel sounds present  Extremities: Trace peripheral edema.  Neurologic: Awake, alert, following commands  Skin: Necrotic appearing second and third toe of left foot  Access: Right IJ temporary dialysis catheter    Basic Metabolic Panel: Recent Labs  Lab 12/26/19 1841 12/28/19 0533 12/30/19 0351 01/02/20 0535  NA  --  131* 136 137  K 3.4* 3.9 3.7 4.2  CL  --  93* 95* 96*  CO2  --  24 24 25   GLUCOSE  --  109* 104* 94  BUN  --  87* 68* 102*  CREATININE  --  5.32* 5.35* 6.63*  CALCIUM  --  9.0 9.4 9.5  MG  --  2.0  --   --   PHOS  --  3.9 3.8 3.4    Liver Function Tests: Recent Labs  Lab 12/28/19 0533 12/30/19 0351 01/02/20 0535  ALBUMIN 2.5* 2.7* 2.7*   No results for input(s): LIPASE, AMYLASE in the last 168 hours. No results for input(s): AMMONIA in the last 168 hours.  CBC: Recent Labs  Lab 12/28/19 0533 12/29/19 1221 12/30/19 0351 01/02/20 0535  WBC 10.3 11.5* 9.8 7.8  HGB 7.0* 8.5* 8.8* 8.2*  HCT 22.9* 27.7* 28.4* 27.1*  MCV 92.7 92.0 92.8 91.2  PLT 345 333 324 283    Cardiac Enzymes: No results for input(s): CKTOTAL, CKMB, CKMBINDEX, TROPONINI in the last 168 hours.  BNP: Invalid input(s): POCBNP  CBG: No results for input(s): GLUCAP in the last 168 hours.  Microbiology: Results for orders placed or performed during the hospital encounter of 11/16/19  SARS Coronavirus 2 by RT PCR (hospital  order, performed in [facility] hospital lab) Nasopharyngeal Nasopharyngeal Swab     Status: None   Collection Time: 11/16/19 11:41 AM   Specimen: Nasopharyngeal Swab  Result Value Ref Range Status   SARS Coronavirus 2 NEGATIVE NEGATIVE Final    Comment: (NOTE) SARS-CoV-2 target nucleic acids are NOT DETECTED. The SARS-CoV-2 RNA is generally detectable in upper and lower respiratory specimens during the acute phase of infection. The lowest concentration of SARS-CoV-2 viral copies this assay can detect is 250 copies / mL. A negative result does not preclude SARS-CoV-2 infection and should not be used as the sole basis for treatment or other patient management decisions.  A negative result may occur with improper specimen collection / handling, submission of specimen other than nasopharyngeal swab, presence of viral mutation(s) within the areas targeted by this assay, and inadequate number of viral copies (<250 copies / mL). A negative result must be combined with clinical observations, patient history, and epidemiological information. Fact Sheet for Patients:   StrictlyIdeas.no Fact Sheet for Healthcare Providers: BankingDealers.co.za This test is not yet approved or cleared  by the [place] FDA and has been authorized for detection and/or diagnosis of SARS-CoV-2 by FDA under an Emergency Use Authorization (EUA).  This EUA will remain in effect (meaning this test can be used) for the duration of the COVID-19 declaration under Section 564(b)(1) of the  Act, 21 U.S.C. section 360bbb-3(b)(1), unless the authorization is terminated or revoked sooner. Performed at [facility], [place] [address], [place], [place] [address]   Culture, blood (routine x 2)     Status: None   Collection Time: 11/16/19  6:03 PM   Specimen: BLOOD RIGHT HAND  Result Value Ref Range Status   Specimen Description BLOOD RIGHT HAND  Final   Special Requests    Final    BOTTLES DRAWN AEROBIC AND ANAEROBIC Blood Culture results may not be optimal due to an inadequate volume of blood received in culture bottles   Culture   Final    NO GROWTH 5 DAYS Performed at [facility], [place] [address], [place], [place] [address]    Report Status 11/21/2019 FINAL  Final  MRSA PCR Screening     Status: None   Collection Time: 11/16/19  6:05 PM   Specimen: Nasal Mucosa; Nasopharyngeal  Result Value Ref Range Status   MRSA by PCR NEGATIVE NEGATIVE Final    Comment:        The GeneXpert MRSA Assay (FDA approved for NASAL specimens only), is one component of a comprehensive MRSA colonization surveillance program. It is not intended to diagnose MRSA infection nor to guide or monitor treatment for MRSA infections. Performed at [facility], [place] [address] [place], [place] [address]   Culture, blood (routine x 2)     Status: None   Collection Time: 11/16/19  6:08 PM   Specimen: BLOOD RIGHT HAND  Result Value Ref Range Status   Specimen Description BLOOD RIGHT HAND  Final   Special Requests   Final    BOTTLES DRAWN AEROBIC ONLY Blood Culture results may not be optimal due to an inadequate volume of blood received in culture bottles   Culture   Final    NO GROWTH 5 DAYS Performed at [facility], [place] [address], [place], [place] [address]    Report Status 11/21/2019 FINAL  Final  Culture, respiratory (non-expectorated)     Status: None   Collection Time: 11/18/19 12:28 PM   Specimen: Tracheal Aspirate; Respiratory  Result Value Ref Range Status   Specimen Description TRACHEAL ASPIRATE  Final   Special Requests NONE  Final   Gram Stain   Final    ABUNDANT WBC PRESENT,BOTH PMN AND MONONUCLEAR ABUNDANT GRAM POSITIVE COCCI ABUNDANT GRAM NEGATIVE RODS FEW GRAM VARIABLE ROD    Culture   Final    ABUNDANT HAEMOPHILUS INFLUENZAE BETA LACTAMASE POSITIVE Performed at [facility], [place] [address], [place], [place] [address]     Report Status 11/20/2019 FINAL  Final  Surgical pcr screen     Status: Abnormal   Collection Time: 11/24/19  7:53 AM   Specimen: Nasal Mucosa; Nasal Swab  Result Value Ref Range Status   MRSA, PCR NEGATIVE NEGATIVE Final   Staphylococcus aureus POSITIVE (A) NEGATIVE Final    Comment: (NOTE) The Xpert SA Assay (FDA approved for NASAL specimens in patients 77 years of age and older), is one component of a comprehensive surveillance program. It is not intended to diagnose infection nor to guide or monitor treatment. Performed at [facility], [place] [address], [place], [place] [address]   Culture, blood (routine x 2)     Status: None   Collection Time: 11/25/19  3:30 PM   Specimen: BLOOD  Result Value Ref Range Status   Specimen Description BLOOD RIGHT ANTECUBITAL  Final  Special Requests   Final    BOTTLES DRAWN AEROBIC ONLY Blood Culture results may not be optimal due to an inadequate volume of blood received in culture bottles   Culture   Final    NO GROWTH 5 DAYS Performed at [facility], [place] [address], [place], [place] [address]    Report Status 11/30/2019 FINAL  Final  Culture, blood (routine x 2)     Status: None   Collection Time: 11/25/19  3:30 PM   Specimen: BLOOD RIGHT HAND  Result Value Ref Range Status   Specimen Description BLOOD RIGHT HAND  Final   Special Requests   Final    BOTTLES DRAWN AEROBIC AND ANAEROBIC Blood Culture results may not be optimal due to an inadequate volume of blood received in culture bottles   Culture   Final    NO GROWTH 5 DAYS Performed at [facility], [place] [address] [place], [place] [address]    Report Status 11/30/2019 FINAL  Final  Culture, respiratory (non-expectorated)     Status: None   Collection Time: 11/25/19  4:35 PM   Specimen: Tracheal Aspirate; Respiratory  Result Value Ref Range Status   Specimen Description TRACHEAL ASPIRATE  Final   Special Requests NONE  Final   Gram Stain   Final    ABUNDANT WBC  PRESENT,BOTH PMN AND MONONUCLEAR FEW GRAM POSITIVE COCCI Performed at [facility], [place] [address], [place], [place] [address]    Culture FEW STAPHYLOCOCCUS EPIDERMIDIS  Final   Report Status 11/28/2019 FINAL  Final   Organism ID, Bacteria STAPHYLOCOCCUS EPIDERMIDIS  Final      Susceptibility   Staphylococcus epidermidis - MIC*    CIPROFLOXACIN <=0.5 SENSITIVE Sensitive     ERYTHROMYCIN >=8 RESISTANT Resistant     GENTAMICIN <=0.5 SENSITIVE Sensitive     OXACILLIN >=4 RESISTANT Resistant     TETRACYCLINE <=1 SENSITIVE Sensitive     VANCOMYCIN 2 SENSITIVE Sensitive     TRIMETH/SULFA <=10 SENSITIVE Sensitive     CLINDAMYCIN >=8 RESISTANT Resistant     RIFAMPIN <=0.5 SENSITIVE Sensitive     Inducible Clindamycin NEGATIVE Sensitive     * FEW STAPHYLOCOCCUS EPIDERMIDIS  Culture, respiratory (non-expectorated)     Status: None   Collection Time: 12/15/19  4:12 PM   Specimen: Tracheal Aspirate; Respiratory  Result Value Ref Range Status   Specimen Description TRACHEAL ASPIRATE  Final   Special Requests NONE  Final   Gram Stain   Final    MODERATE WBC PRESENT, PREDOMINANTLY PMN MODERATE GRAM POSITIVE COCCI IN PAIRS IN CLUSTERS Performed at [facility], [address] [place], [place] [address]    Culture MODERATE STAPHYLOCOCCUS AUREUS  Final   Report Status 12/17/2019 FINAL  Final   Organism ID, Bacteria STAPHYLOCOCCUS AUREUS  Final      Susceptibility   Staphylococcus aureus - MIC*    CIPROFLOXACIN <=0.5 SENSITIVE Sensitive     ERYTHROMYCIN <=0.25 SENSITIVE Sensitive     GENTAMICIN <=0.5 SENSITIVE Sensitive     OXACILLIN <=0.25 SENSITIVE Sensitive     TETRACYCLINE <=1 SENSITIVE Sensitive     VANCOMYCIN 1 SENSITIVE Sensitive     TRIMETH/SULFA <=10 SENSITIVE Sensitive     CLINDAMYCIN <=0.25 SENSITIVE Sensitive     RIFAMPIN <=0.5 SENSITIVE Sensitive     Inducible Clindamycin NEGATIVE Sensitive     * MODERATE STAPHYLOCOCCUS AUREUS  MRSA PCR Screening     Status: None    Collection Time: 12/17/19  10:25 AM   Specimen: Nasopharyngeal  Result Value Ref Range Status   MRSA by PCR NEGATIVE NEGATIVE Final    Comment:        The GeneXpert MRSA Assay (FDA approved for NASAL specimens only), is one component of a comprehensive MRSA colonization surveillance program. It is not intended to diagnose MRSA infection nor to guide or monitor treatment for MRSA infections. Performed at [facility], [place] [address] [place], [place] [address]   Culture, blood (routine x 2)     Status: None   Collection Time: 12/17/19  3:15 PM   Specimen: BLOOD  Result Value Ref Range Status   Specimen Description BLOOD RIGHT ANTECUBITAL  Final   Special Requests   Final    BOTTLES DRAWN AEROBIC AND ANAEROBIC Blood Culture results may not be optimal due to an inadequate volume of blood received in culture bottles   Culture   Final    NO GROWTH 5 DAYS Performed at [facility], [place] [address] [place], [place] [address]    Report Status 12/22/2019 FINAL  Final  Culture, blood (routine x 2)     Status: None   Collection Time: 12/17/19  3:20 PM   Specimen: BLOOD RIGHT HAND  Result Value Ref Range Status   Specimen Description BLOOD RIGHT HAND  Final   Special Requests   Final    BOTTLES DRAWN AEROBIC ONLY Blood Culture results may not be optimal due to an inadequate volume of blood received in culture bottles   Culture   Final    NO GROWTH 5 DAYS Performed at [facility], [place] [address], [place], [place] [address]    Report Status 12/22/2019 FINAL  Final    Coagulation Studies: No results for input(s): LABPROT, INR in the last 72 hours.  Urinalysis: No results for input(s): COLORURINE, LABSPEC, PHURINE, GLUCOSEU, HGBUR, BILIRUBINUR, KETONESUR, PROTEINUR, UROBILINOGEN, NITRITE, LEUKOCYTESUR in the last 72 hours.  Invalid input(s): APPERANCEUR    Imaging: [place] ASCITES (ABDOMEN LIMITED)  Result Date: 01/01/2020 CLINICAL DATA:  54 year old male with  ascites. EXAM: LIMITED ABDOMEN ULTRASOUND FOR ASCITES TECHNIQUE: Limited ultrasound survey for ascites was performed in all four abdominal quadrants. COMPARISON:  Abdominal ultrasound dated 05/19/2017. FINDINGS: There is a small amount of ascites in the visualized abdomen. IMPRESSION: Small ascites. Electronically Signed   By: Anner Crete M.D.   On: 01/01/2020 17:33     Medications:       Assessment/ Plan:  54 y.o. male with a PMHx of recent cardiac arrest status post hypothermia protocol, ESRD on HD MWF, anemia of chronic kidney disease, secondary hyperparathyroidism, history of polysubstance abuse, hypertension,, chronic systolic heart failure, medical nonadherence who was admitted to [facility] on 12/24/2019 for ongoing management of acute respiratory failure.  1.  ESRD on HD MWF.  Patient seen and evaluated during dialysis treatment.  Ultrafiltration target 2.5 kg.  Continue to monitor progress closely.  2.  Anemia of chronic kidney disease.  Hemoglobin currently 8.2.  Maintain the patient on Retacrit.  Received blood transfusion last week.  3.  Secondary hyperparathyroidism.  Phosphorus 3.4 and at target.  4.  Hyperkalemia.  Resolved post dialysis.  Potassium currently 4.2 and acceptable.  5.  Hypotension.  Patient seen during dialysis treatment today.  Tolerating well.  Maintain the patient on norepinephrine during dialysis treatment.   LOS: 0 Jenelle Drennon 1/18/20218:05 AM

## 2020-01-02 NOTE — Progress Notes (Signed)
P[facility] [place]   PULMONARY CRITICAL CARE SERVICE  PROGRESS NOTE  Date of Service: 01/02/2020  [name] [name]  WLN:[id number]  DOB: 1966-11-08   DOA: 12/24/2019  Referring Physician: Merton Border, MD  HPI: [name] [name] is a 54 y.o. male seen for follow up of Acute on Chronic Respiratory Failure.  Patient currently is on full support on assist control mode has been on 40% FiO2 with PEEP of 5  Medications: Reviewed on Rounds  Physical Exam:  Vitals: Temperature 97.8 pulse 85 respiratory 20 blood pressure is 107/72 saturations 100%  Ventilator Settings mode ventilation assist control FiO2 40% tidal line 500 PEEP 5  . General: Comfortable at this time . Eyes: Grossly normal lids, irises & conjunctiva . ENT: grossly tongue is normal . Neck: no obvious mass . Cardiovascular: S1 S2 normal no gallop . Respiratory: No rhonchi coarse breath sounds . Abdomen: soft . Skin: no rash seen on limited exam . Musculoskeletal: not rigid . Psychiatric:unable to assess . Neurologic: no seizure no involuntary movements         Lab Data:   Basic Metabolic Panel: Recent Labs  Lab 12/26/19 1841 12/28/19 0533 12/30/19 0351 01/02/20 0535  NA  --  131* 136 137  K 3.4* 3.9 3.7 4.2  CL  --  93* 95* 96*  CO2  --  24 24 25   GLUCOSE  --  109* 104* 94  BUN  --  87* 68* 102*  CREATININE  --  5.32* 5.35* 6.63*  CALCIUM  --  9.0 9.4 9.5  MG  --  2.0  --   --   PHOS  --  3.9 3.8 3.4    ABG: No results for input(s): PHART, PCO2ART, PO2ART, HCO3, O2SAT in the last 168 hours.  Liver Function Tests: Recent Labs  Lab 12/28/19 0533 12/30/19 0351 01/02/20 0535  ALBUMIN 2.5* 2.7* 2.7*   No results for input(s): LIPASE, AMYLASE in the last 168 hours. No results for input(s): AMMONIA in the last 168 hours.  CBC: Recent Labs  Lab 12/28/19 0533 12/29/19 1221 12/30/19 0351 01/02/20 0535  WBC 10.3 11.5* 9.8 7.8  HGB 7.0* 8.5* 8.8* 8.2*  HCT  22.9* 27.7* 28.4* 27.1*  MCV 92.7 92.0 92.8 91.2  PLT 345 333 324 283    Cardiac Enzymes: No results for input(s): CKTOTAL, CKMB, CKMBINDEX, TROPONINI in the last 168 hours.  BNP (last 3 results) Recent Labs    11/16/19 1356  BNP 962.8*    ProBNP (last 3 results) No results for input(s): PROBNP in the last 8760 hours.  Radiological Exams: [place] ASCITES (ABDOMEN LIMITED)  Result Date: 01/01/2020 CLINICAL DATA:  54 year old male with ascites. EXAM: LIMITED ABDOMEN ULTRASOUND FOR ASCITES TECHNIQUE: Limited ultrasound survey for ascites was performed in all four abdominal quadrants. COMPARISON:  Abdominal ultrasound dated 05/19/2017. FINDINGS: There is a small amount of ascites in the visualized abdomen. IMPRESSION: Small ascites. Electronically Signed   By: Anner Crete M.D.   On: 01/01/2020 17:33    Assessment/Plan Active Problems:   Acute on chronic respiratory failure with hypoxia (HCC)   End stage renal disease on dialysis (HCC)   Acute on chronic systolic and diastolic heart failure, NYHA class 4 (HCC)   Chronic atrial fibrillation ([place])   Acute pulmonary embolism without acute cor pulmonale ([place])   1. Acute on chronic respiratory failure hypoxia plan is to continue with full support on the ventilator patient has not been tolerating weaning today 2. End-stage  renal disease on dialysis patient was requiring Levophed because of low blood pressure issues. 3. Acute systolic heart failure right now appears to be compensated prognosis quite guarded 4. Chronic atrial fibrillation rate controlled 5. Acute pulmonary embolism anticoagulation overall patient's prognosis quite guarded   I have personally seen and evaluated the patient, evaluated laboratory and imaging results, formulated the assessment and plan and placed orders. The Patient requires high complexity decision making with multiple systems involvement.  Time 35 minutes patient critically ill in need of pressors Rounds  were done with the Respiratory Therapy Director and Staff therapists and discussed with nursing staff also.  Allyne Gee, MD [facility] Pulmonary Critical Care Medicine Sleep Medicine

## 2020-01-03 DIAGNOSIS — I2699 Other pulmonary embolism without acute cor pulmonale: Secondary | ICD-10-CM | POA: Diagnosis not present

## 2020-01-03 DIAGNOSIS — Z992 Dependence on renal dialysis: Secondary | ICD-10-CM | POA: Diagnosis not present

## 2020-01-03 DIAGNOSIS — I959 Hypotension, unspecified: Secondary | ICD-10-CM | POA: Diagnosis not present

## 2020-01-03 DIAGNOSIS — Z9911 Dependence on respirator [ventilator] status: Secondary | ICD-10-CM | POA: Diagnosis not present

## 2020-01-03 DIAGNOSIS — N186 End stage renal disease: Secondary | ICD-10-CM | POA: Diagnosis not present

## 2020-01-03 DIAGNOSIS — I5043 Acute on chronic combined systolic (congestive) and diastolic (congestive) heart failure: Secondary | ICD-10-CM | POA: Diagnosis not present

## 2020-01-03 DIAGNOSIS — J9690 Respiratory failure, unspecified, unspecified whether with hypoxia or hypercapnia: Secondary | ICD-10-CM | POA: Diagnosis not present

## 2020-01-03 DIAGNOSIS — I482 Chronic atrial fibrillation, unspecified: Secondary | ICD-10-CM | POA: Diagnosis not present

## 2020-01-03 DIAGNOSIS — J9621 Acute and chronic respiratory failure with hypoxia: Secondary | ICD-10-CM | POA: Diagnosis not present

## 2020-01-03 NOTE — Progress Notes (Signed)
Pulmonary [place]   PULMONARY CRITICAL CARE SERVICE  PROGRESS NOTE  Date of Service: 01/03/2020  Burton Gahan  CBS:[id number]  DOB: 1966/05/01   DOA: 12/24/2019  Referring Physician: Merton Border, MD  HPI: Hanley Woerner is a 54 y.o. male seen for follow up of Acute on Chronic Respiratory Failure. Patient is on full support on assist control mode currently on 30% FiO2  Medications: Reviewed on Rounds  Physical Exam:  Vitals: Temperature 97.8 pulse 111 respiratory rate 24 blood pressure is 113/73 saturations 98%  Ventilator Settings on assist control FiO2 30% tidal volume 614 PEEP 5  . General: Comfortable at this time . Eyes: Grossly normal lids, irises & conjunctiva . ENT: grossly tongue is normal . Neck: no obvious mass . Cardiovascular: S1 S2 normal no gallop . Respiratory: No rhonchi no rales are noted at this time . Abdomen: soft . Skin: no rash seen on limited exam . Musculoskeletal: not rigid . Psychiatric:unable to assess . Neurologic: no seizure no involuntary movements         Lab Data:   Basic Metabolic Panel: Recent Labs  Lab 12/28/19 0533 12/30/19 0351 01/02/20 0535  NA 131* 136 137  K 3.9 3.7 4.2  CL 93* 95* 96*  CO2 24 24 25   GLUCOSE 109* 104* 94  BUN 87* 68* 102*  CREATININE 5.32* 5.35* 6.63*  CALCIUM 9.0 9.4 9.5  MG 2.0  --   --   PHOS 3.9 3.8 3.4    ABG: No results for input(s): PHART, PCO2ART, PO2ART, HCO3, O2SAT in the last 168 hours.  Liver Function Tests: Recent Labs  Lab 12/28/19 0533 12/30/19 0351 01/02/20 0535  ALBUMIN 2.5* 2.7* 2.7*   No results for input(s): LIPASE, AMYLASE in the last 168 hours. No results for input(s): AMMONIA in the last 168 hours.  CBC: Recent Labs  Lab 12/28/19 0533 12/29/19 1221 12/30/19 0351 01/02/20 0535  WBC 10.3 11.5* 9.8 7.8  HGB 7.0* 8.5* 8.8* 8.2*  HCT 22.9* 27.7* 28.4* 27.1*  MCV 92.7 92.0 92.8 91.2  PLT 345 333 324 283    Cardiac  Enzymes: No results for input(s): CKTOTAL, CKMB, CKMBINDEX, TROPONINI in the last 168 hours.  BNP (last 3 results) Recent Labs    11/16/19 1356  BNP 962.8*    ProBNP (last 3 results) No results for input(s): PROBNP in the last 8760 hours.  Radiological Exams: [place] ASCITES (ABDOMEN LIMITED)  Result Date: 01/01/2020 CLINICAL DATA:  54 year old male with ascites. EXAM: LIMITED ABDOMEN ULTRASOUND FOR ASCITES TECHNIQUE: Limited ultrasound survey for ascites was performed in all four abdominal quadrants. COMPARISON:  Abdominal ultrasound dated 05/19/2017. FINDINGS: There is a small amount of ascites in the visualized abdomen. IMPRESSION: Small ascites. Electronically Signed   By: Anner Crete M.D.   On: 01/01/2020 17:33    Assessment/Plan Active Problems:   Acute on chronic respiratory failure with hypoxia (HCC)   End stage renal disease on dialysis (HCC)   Acute on chronic systolic and diastolic heart failure, NYHA class 4 (HCC)   Chronic atrial fibrillation ([place])   Acute pulmonary embolism without acute cor pulmonale ([place])   1. Acute on chronic respiratory failure hypoxia plan to continue with full support on assist control mode titrate oxygen down as tolerated patient's mechanics have been poor again not tolerating weaning attempt. 2. End-stage renal disease on hemodialysis we will continue with dialysis recommendations 3. Acute on chronic systolic heart failure we will continue with supportive care monitor fluid  status 4. Chronic atrial fibrillation rate is controlled 5. Pulmonary embolism treated continue to follow   I have personally seen and evaluated the patient, evaluated laboratory and imaging results, formulated the assessment and plan and placed orders. The Patient requires high complexity decision making with multiple systems involvement.  Rounds were done with the Respiratory Therapy Director and Staff therapists and discussed with nursing staff also.  Allyne Gee,  MD [facility] Pulmonary Critical Care Medicine Sleep Medicine

## 2020-01-03 NOTE — Progress Notes (Signed)
  Progress Note    01/03/2020 11:21 AM    Subjective:  Mechanical ventilation via trach.  Alert and mouthing responses to questions.  Off pressors.   Physical Exam: Lungs:  Mechanical ventilation Extremities:  All blisters unroofed from all toes; toes of right foot with healthy underlying skin a healthy wound bed; more extensive wounds of left foot toes macerated from Mepilex border dressing and heavy burden of sloughed necrotic skin; after debridement, mostly healthy wound bed underneath; palpable 2+ DP and PT pulses bilaterally Neurologic: Alert and oriented  CBC    Component Value Date/Time   WBC 7.8 01/02/2020 0535   RBC 2.97 (L) 01/02/2020 0535   HGB 8.2 (L) 01/02/2020 0535   HGB 12.3 (L) 12/31/2017 1012   HCT 27.1 (L) 01/02/2020 0535   HCT 37.4 (L) 12/31/2017 1012   PLT 283 01/02/2020 0535   PLT 176 12/31/2017 1012   MCV 91.2 01/02/2020 0535   MCV 99 (H) 12/31/2017 1012   MCH 27.6 01/02/2020 0535   MCHC 30.3 01/02/2020 0535   RDW 16.8 (H) 01/02/2020 0535   RDW 18.8 (H) 12/31/2017 1012   LYMPHSABS 1.1 12/17/2019 1504   LYMPHSABS 1.0 12/31/2017 1012   MONOABS 1.7 (H) 12/17/2019 1504   EOSABS 0.5 12/17/2019 1504   EOSABS 0.1 12/31/2017 1012   BASOSABS 0.1 12/17/2019 1504   BASOSABS 0.0 12/31/2017 1012    BMET    Component Value Date/Time   NA 137 01/02/2020 0535   K 4.2 01/02/2020 0535   CL 96 (L) 01/02/2020 0535   CO2 25 01/02/2020 0535   GLUCOSE 94 01/02/2020 0535   BUN 102 (H) 01/02/2020 0535   CREATININE 6.63 (H) 01/02/2020 0535   CALCIUM 9.5 01/02/2020 0535   CALCIUM 8.6 (L) 12/23/2019 0323   GFRNONAA 9 (L) 01/02/2020 0535   GFRAA 10 (L) 01/02/2020 0535    INR    Component Value Date/Time   INR 1.6 (H) 11/16/2019 2005    No intake or output data in the 24 hours ending 01/03/20 1121   Assessment/Plan:  54 y.o. male who presented with cardiac arrest and required pressors for extended time now with ischemic changes to BLE  -Bilateral lower  extremities well perfused with easily palpable DP and PT pulses -All blistering of toes have been unroofed; healthy underlying skin with healthy wound bed all toes of right foot -Left foot with more slough and necrotic skin burden; after bedside debridement mostly healthy underlying skin; discussed dressing changes with patient's RN to include placing 2 x 2 between all toes of left foot and changing daily -When mobility increased patient would likely benefit from Dar[place] shoe at least on his left foot to optimize chance for healing -No indication for surgical intervention at this time   Dagoberto Ligas, PA-C [facility] [phone] 01/03/2020 11:21 AM

## 2020-01-04 ENCOUNTER — Other Ambulatory Visit (HOSPITAL_COMMUNITY): Payer: Medicare Other

## 2020-01-04 DIAGNOSIS — I482 Chronic atrial fibrillation, unspecified: Secondary | ICD-10-CM | POA: Diagnosis not present

## 2020-01-04 DIAGNOSIS — I959 Hypotension, unspecified: Secondary | ICD-10-CM | POA: Diagnosis not present

## 2020-01-04 DIAGNOSIS — J9690 Respiratory failure, unspecified, unspecified whether with hypoxia or hypercapnia: Secondary | ICD-10-CM | POA: Diagnosis not present

## 2020-01-04 DIAGNOSIS — I2699 Other pulmonary embolism without acute cor pulmonale: Secondary | ICD-10-CM | POA: Diagnosis not present

## 2020-01-04 DIAGNOSIS — Z4682 Encounter for fitting and adjustment of non-vascular catheter: Secondary | ICD-10-CM | POA: Diagnosis not present

## 2020-01-04 DIAGNOSIS — Z9911 Dependence on respirator [ventilator] status: Secondary | ICD-10-CM | POA: Diagnosis not present

## 2020-01-04 DIAGNOSIS — N2581 Secondary hyperparathyroidism of renal origin: Secondary | ICD-10-CM | POA: Diagnosis not present

## 2020-01-04 DIAGNOSIS — D631 Anemia in chronic kidney disease: Secondary | ICD-10-CM | POA: Diagnosis not present

## 2020-01-04 DIAGNOSIS — I5043 Acute on chronic combined systolic (congestive) and diastolic (congestive) heart failure: Secondary | ICD-10-CM | POA: Diagnosis not present

## 2020-01-04 DIAGNOSIS — Z992 Dependence on renal dialysis: Secondary | ICD-10-CM | POA: Diagnosis not present

## 2020-01-04 DIAGNOSIS — N186 End stage renal disease: Secondary | ICD-10-CM | POA: Diagnosis not present

## 2020-01-04 DIAGNOSIS — J9621 Acute and chronic respiratory failure with hypoxia: Secondary | ICD-10-CM | POA: Diagnosis not present

## 2020-01-04 LAB — CBC
HCT: 30.2 % — ABNORMAL LOW (ref 39.0–52.0)
Hemoglobin: 9.2 g/dL — ABNORMAL LOW (ref 13.0–17.0)
MCH: 27.9 pg (ref 26.0–34.0)
MCHC: 30.5 g/dL (ref 30.0–36.0)
MCV: 91.5 fL (ref 80.0–100.0)
Platelets: 278 10*3/uL (ref 150–400)
RBC: 3.3 MIL/uL — ABNORMAL LOW (ref 4.22–5.81)
RDW: 16.5 % — ABNORMAL HIGH (ref 11.5–15.5)
WBC: 7.1 10*3/uL (ref 4.0–10.5)
nRBC: 0 % (ref 0.0–0.2)

## 2020-01-04 LAB — RENAL FUNCTION PANEL
Albumin: 3 g/dL — ABNORMAL LOW (ref 3.5–5.0)
Anion gap: 15 (ref 5–15)
BUN: 98 mg/dL — ABNORMAL HIGH (ref 6–20)
CO2: 24 mmol/L (ref 22–32)
Calcium: 9.5 mg/dL (ref 8.9–10.3)
Chloride: 95 mmol/L — ABNORMAL LOW (ref 98–111)
Creatinine, Ser: 6.03 mg/dL — ABNORMAL HIGH (ref 0.61–1.24)
GFR calc Af Amer: 11 mL/min — ABNORMAL LOW (ref >60)
GFR calc non Af Amer: 10 mL/min — ABNORMAL LOW (ref >60)
Glucose, Bld: 114 mg/dL — ABNORMAL HIGH (ref 70–99)
Phosphorus: 2.8 mg/dL (ref 2.5–4.6)
Potassium: 3.8 mmol/L (ref 3.5–5.1)
Sodium: 134 mmol/L — ABNORMAL LOW (ref 135–145)

## 2020-01-04 NOTE — Progress Notes (Signed)
Pulmonary [place]   PULMONARY CRITICAL CARE SERVICE  PROGRESS NOTE  Date of Service: 01/04/2020  Guy Seese  IHK:[id number]  DOB: [birth year]   DOA: 12/24/2019  Referring Physician: Merton Border, MD  HPI: [name] [name] is a 54 y.o. male seen for follow up of Acute on Chronic Respiratory Failure.  Patient is on full support right now on assist control mode currently on 30% FiO2 with a PEEP of 5  Medications: Reviewed on Rounds  Physical Exam:  Vitals: Temperature 96.4 pulse 97 respiratory rate 24 blood pressure is 110/78 saturations 100%  Ventilator Settings mode ventilation assist control FiO2 30% tidal line 590 PEEP 5  . General: Comfortable at this time . Eyes: Grossly normal lids, irises & conjunctiva . ENT: grossly tongue is normal . Neck: no obvious mass . Cardiovascular: S1 S2 normal no gallop . Respiratory: No rhonchi no rales are noted at this time . Abdomen: soft . Skin: no rash seen on limited exam . Musculoskeletal: not rigid . Psychiatric:unable to assess . Neurologic: no seizure no involuntary movements         Lab Data:   Basic Metabolic Panel: Recent Labs  Lab 12/30/19 0351 01/02/20 0535 01/04/20 0449  NA 136 137 134*  K 3.7 4.2 3.8  CL 95* 96* 95*  CO2 24 25 24   GLUCOSE 104* 94 114*  BUN 68* 102* 98*  CREATININE 5.35* 6.63* 6.03*  CALCIUM 9.4 9.5 9.5  PHOS 3.8 3.4 2.8    ABG: No results for input(s): PHART, PCO2ART, PO2ART, HCO3, O2SAT in the last 168 hours.  Liver Function Tests: Recent Labs  Lab 12/30/19 0351 01/02/20 0535 01/04/20 0449  ALBUMIN 2.7* 2.7* 3.0*   No results for input(s): LIPASE, AMYLASE in the last 168 hours. No results for input(s): AMMONIA in the last 168 hours.  CBC: Recent Labs  Lab 12/29/19 1221 12/30/19 0351 01/02/20 0535 01/04/20 0449  WBC 11.5* 9.8 7.8 7.1  HGB 8.5* 8.8* 8.2* 9.2*  HCT 27.7* 28.4* 27.1* 30.2*  MCV 92.0 92.8 91.2 91.5  PLT 333 324 283  278    Cardiac Enzymes: No results for input(s): CKTOTAL, CKMB, CKMBINDEX, TROPONINI in the last 168 hours.  BNP (last 3 results) Recent Labs    11/16/19 1356  BNP 962.8*    ProBNP (last 3 results) No results for input(s): PROBNP in the last 8760 hours.  Radiological Exams: CT ABDOMEN WO CONTRAST  Result Date: 01/04/2020 CLINICAL DATA:  Anatomy for possible percutaneous gastrostomy tube placement. EXAM: CT ABDOMEN WITHOUT CONTRAST TECHNIQUE: Multidetector CT imaging of the abdomen was performed following the standard protocol without IV contrast. COMPARISON:  CT dated May 18, 2018. FINDINGS: Lower chest: There are small bilateral pleural effusions.Bibasilar atelectasis is noted. The heart size is significantly enlarged. There is a large partially visualized pericardial effusion. Hepatobiliary: The liver is normal. Cholelithiasis without acute inflammation.There is no biliary ductal dilation. Pancreas: Normal contours without ductal dilatation. No peripancreatic fluid collection. Spleen: The spleen is enlarged measuring approximately 14 cm craniocaudad. Adrenals/Urinary Tract: --Adrenal glands: No adrenal hemorrhage. --Right kidney/ureter: The right kidney is atrophic with innumerable simple and hyperdense cysts. --Left kidney/ureter: The left kidney is atrophic with innumerable simple and hyperdense cysts. --Urinary bladder: Outside the field of view. Stomach/Bowel: --Stomach/Duodenum: There is an enteric feeding tube terminating near the gastric antrum. There is an air-fluid level in the moderately distended stomach. The gastric anatomy is suitable for percutaneous gastrostomy tube placement. --Small bowel: No dilatation or inflammation. --  Colon: The visualized portions of the colon are unremarkable. --Appendix: Outside the field of view. Vascular/Lymphatic: Atherosclerotic calcification is present within the non-aneurysmal abdominal aorta, without hemodynamically significant stenosis. There is a  retroaortic left renal vein, a normal variant. --No retroperitoneal lymphadenopathy. --No mesenteric lymphadenopathy. Other: There are bilateral psoas complex collections. On the right the collection measures approximately 4.4 x 4.7 cm. On the left the collection measures approximately 4.2 x 3.8 cm. There is a small volume of intra-abdominal free fluid some of which appears to be relatively hyperdense. This density is likely artifactual as the density within the gallbladder is increased as well. Musculoskeletal. No acute displaced fractures. IMPRESSION: 1. Anatomy is suitable for percutaneous gastrostomy tube placement. 2. Bilateral psoas collections as detailed above favored to represent hematomas. 3. Large partially visualized pericardial effusion. 4. Small bilateral pleural effusions.  Bibasilar atelectasis. 5. Cardiomegaly. 6. Small volume free fluid in the abdomen. 7. Additional chronic findings as detailed above. These results will be called to the ordering clinician or representative by the Radiologist Assistant, and communication documented in the PACS or zVision Dashboard. Electronically Signed   By: Constance Holster M.D.   On: 01/04/2020 01:56    Assessment/Plan Active Problems:   Acute on chronic respiratory failure with hypoxia (HCC)   End stage renal disease on dialysis (HCC)   Acute on chronic systolic and diastolic heart failure, NYHA class 4 (HCC)   Chronic atrial fibrillation ([facility] [place])   Acute pulmonary embolism without acute cor pulmonale ([place])   1. Acute on chronic respiratory failure hypoxia plan is to continue with full support on assist control titrate oxygen continue pulmonary toilet patient's not been tolerating weaning still 2. End-stage renal disease on hemodialysis we will continue to follow along 3. Acute on chronic systolic heart failure need to monitor fluid status closely diuretics as tolerated 4. Chronic atrial fibrillation rate controlled 5. Pulmonary embolism no change  continue supportive care no active bleeding   I have personally seen and evaluated the patient, evaluated laboratory and imaging results, formulated the assessment and plan and placed orders. The Patient requires high complexity decision making with multiple systems involvement.  Rounds were done with the Respiratory Therapy Director and Staff therapists and discussed with nursing staff also.  Allyne Gee, MD [facility] Pulmonary Critical Care Medicine Sleep Medicine

## 2020-01-04 NOTE — Progress Notes (Signed)
[facility]  ROUNDING NOTE   Subjective:  Patient seen and evaluated during dialysis treatment today. Continues to be on norepinephrine to maintain blood pressure during dialysis treatment.   Objective:  Vital signs in last 24 hours:  Temperature 96.4 pulse 91 respirations 24 blood pressure 110/78  Physical Exam: General: Chronically ill-appearing  Head: Normocephalic, atraumatic. Moist oral mucosal membranes  Eyes: Anicteric  Neck: Tracheostomy present  Lungs:  Scattered rhonchi  Heart: S1S2 no rubs  Abdomen:  Soft, nontender, bowel sounds present  Extremities: Trace peripheral edema.  Neurologic: Awake, alert, following commands  Skin: Right and left toes covered  Access: Right IJ temporary dialysis catheter    Basic Metabolic Panel: Recent Labs  Lab 12/30/19 0351 01/02/20 0535 01/04/20 0449  NA 136 137 134*  K 3.7 4.2 3.8  CL 95* 96* 95*  CO2 24 25 24   GLUCOSE 104* 94 114*  BUN 68* 102* 98*  CREATININE 5.35* 6.63* 6.03*  CALCIUM 9.4 9.5 9.5  PHOS 3.8 3.4 2.8    Liver Function Tests: Recent Labs  Lab 12/30/19 0351 01/02/20 0535 01/04/20 0449  ALBUMIN 2.7* 2.7* 3.0*   No results for input(s): LIPASE, AMYLASE in the last 168 hours. No results for input(s): AMMONIA in the last 168 hours.  CBC: Recent Labs  Lab 12/29/19 1221 12/30/19 0351 01/02/20 0535 01/04/20 0449  WBC 11.5* 9.8 7.8 7.1  HGB 8.5* 8.8* 8.2* 9.2*  HCT 27.7* 28.4* 27.1* 30.2*  MCV 92.0 92.8 91.2 91.5  PLT 333 324 283 278    Cardiac Enzymes: No results for input(s): CKTOTAL, CKMB, CKMBINDEX, TROPONINI in the last 168 hours.  BNP: Invalid input(s): POCBNP  CBG: No results for input(s): GLUCAP in the last 168 hours.  Microbiology: Results for orders placed or performed during the hospital encounter of 11/16/19  SARS Coronavirus 2 by RT PCR (hospital order, performed in [facility] hospital lab) Nasopharyngeal Nasopharyngeal Swab     Status: None   Collection  Time: 11/16/19 11:41 AM   Specimen: Nasopharyngeal Swab  Result Value Ref Range Status   SARS Coronavirus 2 NEGATIVE NEGATIVE Final    Comment: (NOTE) SARS-CoV-2 target nucleic acids are NOT DETECTED. The SARS-CoV-2 RNA is generally detectable in upper and lower respiratory specimens during the acute phase of infection. The lowest concentration of SARS-CoV-2 viral copies this assay can detect is 250 copies / mL. A negative result does not preclude SARS-CoV-2 infection and should not be used as the sole basis for treatment or other patient management decisions.  A negative result may occur with improper specimen collection / handling, submission of specimen other than nasopharyngeal swab, presence of viral mutation(s) within the areas targeted by this assay, and inadequate number of viral copies (<250 copies / mL). A negative result must be combined with clinical observations, patient history, and epidemiological information. Fact Sheet for Patients:   StrictlyIdeas.no Fact Sheet for Healthcare Providers: BankingDealers.co.za This test is not yet approved or cleared  by the [place] FDA and has been authorized for detection and/or diagnosis of SARS-CoV-2 by FDA under an Emergency Use Authorization (EUA).  This EUA will remain in effect (meaning this test can be used) for the duration of the COVID-19 declaration under Section 564(b)(1) of the Act, 21 U.S.C. section 360bbb-3(b)(1), unless the authorization is terminated or revoked sooner. Performed at [facility], [place] [address], [place], [place] [address]   Culture, blood (routine x 2)     Status: None   Collection Time:  11/16/19  6:03 PM   Specimen: BLOOD RIGHT HAND  Result Value Ref Range Status   Specimen Description BLOOD RIGHT HAND  Final   Special Requests   Final    BOTTLES DRAWN AEROBIC AND ANAEROBIC Blood Culture results may not be optimal due to an inadequate  volume of blood received in culture bottles   Culture   Final    NO GROWTH 5 DAYS Performed at [facility], [place] [address] [place], [place] [address]    Report Status 11/21/2019 FINAL  Final  MRSA PCR Screening     Status: None   Collection Time: 11/16/19  6:05 PM   Specimen: Nasal Mucosa; Nasopharyngeal  Result Value Ref Range Status   MRSA by PCR NEGATIVE NEGATIVE Final    Comment:        The GeneXpert MRSA Assay (FDA approved for NASAL specimens only), is one component of a comprehensive MRSA colonization surveillance program. It is not intended to diagnose MRSA infection nor to guide or monitor treatment for MRSA infections. Performed at [facility], [place] [address], [place], [place] [address]   Culture, blood (routine x 2)     Status: None   Collection Time: 11/16/19  6:08 PM   Specimen: BLOOD RIGHT HAND  Result Value Ref Range Status   Specimen Description BLOOD RIGHT HAND  Final   Special Requests   Final    BOTTLES DRAWN AEROBIC ONLY Blood Culture results may not be optimal due to an inadequate volume of blood received in culture bottles   Culture   Final    NO GROWTH 5 DAYS Performed at [facility], [place] [address] [place], [place] [address]    Report Status 11/21/2019 FINAL  Final  Culture, respiratory (non-expectorated)     Status: None   Collection Time: 11/18/19 12:28 PM   Specimen: Tracheal Aspirate; Respiratory  Result Value Ref Range Status   Specimen Description TRACHEAL ASPIRATE  Final   Special Requests NONE  Final   Gram Stain   Final    ABUNDANT WBC PRESENT,BOTH PMN AND MONONUCLEAR ABUNDANT GRAM POSITIVE COCCI ABUNDANT GRAM NEGATIVE RODS FEW GRAM VARIABLE ROD    Culture   Final    ABUNDANT HAEMOPHILUS INFLUENZAE BETA LACTAMASE POSITIVE Performed at [facility], [place] [address], [place], [place] [address]    Report Status 11/20/2019 FINAL  Final  Surgical pcr screen     Status: Abnormal   Collection Time: 11/24/19   7:53 AM   Specimen: Nasal Mucosa; Nasal Swab  Result Value Ref Range Status   MRSA, PCR NEGATIVE NEGATIVE Final   Staphylococcus aureus POSITIVE (A) NEGATIVE Final    Comment: (NOTE) The Xpert SA Assay (FDA approved for NASAL specimens in patients 46 years of age and older), is one component of a comprehensive surveillance program. It is not intended to diagnose infection nor to guide or monitor treatment. Performed at [facility], [place] [address], [place], [place] [address]   Culture, blood (routine x 2)     Status: None   Collection Time: 11/25/19  3:30 PM   Specimen: BLOOD  Result Value Ref Range Status   Specimen Description BLOOD RIGHT ANTECUBITAL  Final   Special Requests   Final    BOTTLES DRAWN AEROBIC ONLY Blood Culture results may not be optimal due to an inadequate volume of blood received in culture bottles   Culture   Final    NO GROWTH 5 DAYS  Performed at [facility], [place] [address] [place], [place] [address]    Report Status 11/30/2019 FINAL  Final  Culture, blood (routine x 2)     Status: None   Collection Time: 11/25/19  3:30 PM   Specimen: BLOOD RIGHT HAND  Result Value Ref Range Status   Specimen Description BLOOD RIGHT HAND  Final   Special Requests   Final    BOTTLES DRAWN AEROBIC AND ANAEROBIC Blood Culture results may not be optimal due to an inadequate volume of blood received in culture bottles   Culture   Final    NO GROWTH 5 DAYS Performed at [facility], [place] [address], [place], [place] [address]    Report Status 11/30/2019 FINAL  Final  Culture, respiratory (non-expectorated)     Status: None   Collection Time: 11/25/19  4:35 PM   Specimen: Tracheal Aspirate; Respiratory  Result Value Ref Range Status   Specimen Description TRACHEAL ASPIRATE  Final   Special Requests NONE  Final   Gram Stain   Final    ABUNDANT WBC PRESENT,BOTH PMN AND MONONUCLEAR FEW GRAM POSITIVE COCCI Performed at [facility], [place] [address] [place], [place] [address]    Culture FEW STAPHYLOCOCCUS EPIDERMIDIS  Final   Report Status 11/28/2019 FINAL  Final   Organism ID, Bacteria STAPHYLOCOCCUS EPIDERMIDIS  Final      Susceptibility   Staphylococcus epidermidis - MIC*    CIPROFLOXACIN <=0.5 SENSITIVE Sensitive     ERYTHROMYCIN >=8 RESISTANT Resistant     GENTAMICIN <=0.5 SENSITIVE Sensitive     OXACILLIN >=4 RESISTANT Resistant     TETRACYCLINE <=1 SENSITIVE Sensitive     VANCOMYCIN 2 SENSITIVE Sensitive     TRIMETH/SULFA <=10 SENSITIVE Sensitive     CLINDAMYCIN >=8 RESISTANT Resistant     RIFAMPIN <=0.5 SENSITIVE Sensitive     Inducible Clindamycin NEGATIVE Sensitive     * FEW STAPHYLOCOCCUS EPIDERMIDIS  Culture, respiratory (non-expectorated)     Status: None   Collection Time: 12/15/19  4:12 PM   Specimen: Tracheal Aspirate; Respiratory  Result Value Ref Range Status   Specimen Description TRACHEAL ASPIRATE  Final   Special Requests NONE  Final   Gram Stain   Final    MODERATE WBC PRESENT, PREDOMINANTLY PMN MODERATE GRAM POSITIVE COCCI IN PAIRS IN CLUSTERS Performed at [facility], [address] [place], [place] [address]    Culture MODERATE STAPHYLOCOCCUS AUREUS  Final   Report Status 12/17/2019 FINAL  Final   Organism ID, Bacteria STAPHYLOCOCCUS AUREUS  Final      Susceptibility   Staphylococcus aureus - MIC*    CIPROFLOXACIN <=0.5 SENSITIVE Sensitive     ERYTHROMYCIN <=0.25 SENSITIVE Sensitive     GENTAMICIN <=0.5 SENSITIVE Sensitive     OXACILLIN <=0.25 SENSITIVE Sensitive     TETRACYCLINE <=1 SENSITIVE Sensitive     VANCOMYCIN 1 SENSITIVE Sensitive     TRIMETH/SULFA <=10 SENSITIVE Sensitive     CLINDAMYCIN <=0.25 SENSITIVE Sensitive     RIFAMPIN <=0.5 SENSITIVE Sensitive     Inducible Clindamycin NEGATIVE Sensitive     * MODERATE STAPHYLOCOCCUS AUREUS  MRSA PCR Screening     Status: None   Collection Time: 12/17/19 10:25 AM   Specimen: Nasopharyngeal  Result Value Ref Range Status   MRSA by  PCR NEGATIVE NEGATIVE Final    Comment:        The GeneXpert MRSA Assay (FDA approved for NASAL specimens only), is one  component of a comprehensive MRSA colonization surveillance program. It is not intended to diagnose MRSA infection nor to guide or monitor treatment for MRSA infections. Performed at [facility], [place] [address] [place], [place] [address]   Culture, blood (routine x 2)     Status: None   Collection Time: 12/17/19  3:15 PM   Specimen: BLOOD  Result Value Ref Range Status   Specimen Description BLOOD RIGHT ANTECUBITAL  Final   Special Requests   Final    BOTTLES DRAWN AEROBIC AND ANAEROBIC Blood Culture results may not be optimal due to an inadequate volume of blood received in culture bottles   Culture   Final    NO GROWTH 5 DAYS Performed at [facility], [place] [address] [place], [place] [address]    Report Status 12/22/2019 FINAL  Final  Culture, blood (routine x 2)     Status: None   Collection Time: 12/17/19  3:20 PM   Specimen: BLOOD RIGHT HAND  Result Value Ref Range Status   Specimen Description BLOOD RIGHT HAND  Final   Special Requests   Final    BOTTLES DRAWN AEROBIC ONLY Blood Culture results may not be optimal due to an inadequate volume of blood received in culture bottles   Culture   Final    NO GROWTH 5 DAYS Performed at [facility], [place] [address], [place],  [address]    Report Status 12/22/2019 FINAL  Final    Coagulation Studies: No results for input(s): LABPROT, INR in the last 72 hours.  Urinalysis: No results for input(s): COLORURINE, LABSPEC, PHURINE, GLUCOSEU, HGBUR, BILIRUBINUR, KETONESUR, PROTEINUR, UROBILINOGEN, NITRITE, LEUKOCYTESUR in the last 72 hours.  Invalid input(s): APPERANCEUR    Imaging: CT ABDOMEN WO CONTRAST  Result Date: 01/04/2020 CLINICAL DATA:  Anatomy for possible percutaneous gastrostomy tube placement. EXAM: CT ABDOMEN WITHOUT CONTRAST TECHNIQUE: Multidetector CT imaging of the  abdomen was performed following the standard protocol without IV contrast. COMPARISON:  CT dated May 18, 2018. FINDINGS: Lower chest: There are small bilateral pleural effusions.Bibasilar atelectasis is noted. The heart size is significantly enlarged. There is a large partially visualized pericardial effusion. Hepatobiliary: The liver is normal. Cholelithiasis without acute inflammation.There is no biliary ductal dilation. Pancreas: Normal contours without ductal dilatation. No peripancreatic fluid collection. Spleen: The spleen is enlarged measuring approximately 14 cm craniocaudad. Adrenals/Urinary Tract: --Adrenal glands: No adrenal hemorrhage. --Right kidney/ureter: The right kidney is atrophic with innumerable simple and hyperdense cysts. --Left kidney/ureter: The left kidney is atrophic with innumerable simple and hyperdense cysts. --Urinary bladder: Outside the field of view. Stomach/Bowel: --Stomach/Duodenum: There is an enteric feeding tube terminating near the gastric antrum. There is an air-fluid level in the moderately distended stomach. The gastric anatomy is suitable for percutaneous gastrostomy tube placement. --Small bowel: No dilatation or inflammation. --Colon: The visualized portions of the colon are unremarkable. --Appendix: Outside the field of view. Vascular/Lymphatic: Atherosclerotic calcification is present within the non-aneurysmal abdominal aorta, without hemodynamically significant stenosis. There is a retroaortic left renal vein, a normal variant. --No retroperitoneal lymphadenopathy. --No mesenteric lymphadenopathy. Other: There are bilateral psoas complex collections. On the right the collection measures approximately 4.4 x 4.7 cm. On the left the collection measures approximately 4.2 x 3.8 cm. There is a small volume of intra-abdominal free fluid some of which appears to be relatively hyperdense. This density is likely artifactual as the density within the gallbladder is increased as  well. Musculoskeletal. No acute displaced fractures. IMPRESSION: 1.  Anatomy is suitable for percutaneous gastrostomy tube placement. 2. Bilateral psoas collections as detailed above favored to represent hematomas. 3. Large partially visualized pericardial effusion. 4. Small bilateral pleural effusions.  Bibasilar atelectasis. 5. Cardiomegaly. 6. Small volume free fluid in the abdomen. 7. Additional chronic findings as detailed above. These results will be called to the ordering clinician or representative by the Radiologist Assistant, and communication documented in the PACS or zVision Dashboard. Electronically Signed   By: Constance Holster M.D.   On: 01/04/2020 01:56     Medications:       Assessment/ Plan:  54 y.o. male with a PMHx of recent cardiac arrest status post hypothermia protocol, ESRD on HD MWF, anemia of chronic kidney disease, secondary hyperparathyroidism, history of polysubstance abuse, hypertension,, chronic systolic heart failure, medical nonadherence who was admitted to [facility] on 12/24/2019 for ongoing management of acute respiratory failure.  1.  ESRD on HD MWF.  Patient seen during dialysis treatment.  Ultrafiltration target 1.5 kg today.  Continue norepinephrine to maintain blood pressure during dialysis treatment.  2.  Anemia of chronic kidney disease.  Hemoglobin up to 9.2.  Maintain the patient on Retacrit.  3.  Secondary hyperparathyroidism.  Phosphorus under good control with a phosphorus of 2.8.  4.  Hyperkalemia.  Potassium now normalized.  5.  Hypotension.  Continue use of albumin as well as norepinephrine during dialysis treatment to maintain blood pressure.   LOS: 0 [name] [name] 1/20/202110:45 AM

## 2020-01-05 ENCOUNTER — Other Ambulatory Visit (HOSPITAL_COMMUNITY): Payer: Medicare Other

## 2020-01-05 DIAGNOSIS — Z9911 Dependence on respirator [ventilator] status: Secondary | ICD-10-CM | POA: Diagnosis not present

## 2020-01-05 DIAGNOSIS — I5040 Unspecified combined systolic (congestive) and diastolic (congestive) heart failure: Secondary | ICD-10-CM | POA: Diagnosis not present

## 2020-01-05 DIAGNOSIS — R1310 Dysphagia, unspecified: Secondary | ICD-10-CM | POA: Diagnosis not present

## 2020-01-05 DIAGNOSIS — I313 Pericardial effusion (noninflammatory): Secondary | ICD-10-CM | POA: Diagnosis not present

## 2020-01-05 DIAGNOSIS — J9621 Acute and chronic respiratory failure with hypoxia: Secondary | ICD-10-CM | POA: Diagnosis not present

## 2020-01-05 DIAGNOSIS — J9690 Respiratory failure, unspecified, unspecified whether with hypoxia or hypercapnia: Secondary | ICD-10-CM | POA: Diagnosis not present

## 2020-01-05 DIAGNOSIS — I482 Chronic atrial fibrillation, unspecified: Secondary | ICD-10-CM | POA: Diagnosis not present

## 2020-01-05 DIAGNOSIS — I4821 Permanent atrial fibrillation: Secondary | ICD-10-CM | POA: Diagnosis not present

## 2020-01-05 DIAGNOSIS — I5043 Acute on chronic combined systolic (congestive) and diastolic (congestive) heart failure: Secondary | ICD-10-CM | POA: Diagnosis not present

## 2020-01-05 DIAGNOSIS — N186 End stage renal disease: Secondary | ICD-10-CM | POA: Diagnosis not present

## 2020-01-05 DIAGNOSIS — Z992 Dependence on renal dialysis: Secondary | ICD-10-CM | POA: Diagnosis not present

## 2020-01-05 DIAGNOSIS — I2699 Other pulmonary embolism without acute cor pulmonale: Secondary | ICD-10-CM | POA: Diagnosis not present

## 2020-01-05 DIAGNOSIS — I5021 Acute systolic (congestive) heart failure: Secondary | ICD-10-CM | POA: Diagnosis not present

## 2020-01-05 NOTE — Progress Notes (Signed)
Patient ID: [name] [name], male   DOB: 04/10/1966, 53 y.o.   MRN: [id number]   2D Echocardiogram has been performed.  Cloria Spring, RCS

## 2020-01-05 NOTE — Consult Note (Signed)
Chief Complaint: Patient was seen in consultation today for percutaneous gastric tube placement at the request of Dr Mariah Milling  Referring Physician(s): Robb Matar [facility]  Supervising Physician: Corrie Mckusick  Patient Status: Select IP  History of Present Illness: [name] [name] is a 53 y.o. male   Polysubstance abuse Cardiac arrest x 2 ESRD Vent/trach Dysphagia FTT Need for long term care  Request for percutaneous gastric tube placement Imaging has been reviewed and procedure approved  Past Medical History:  Diagnosis Date  . Abdominal distension 08/10/2018  . Acute on chronic respiratory failure with hypoxia ([place])   . Acute on chronic systolic and diastolic heart failure, NYHA class 4 ([place])   . Acute pulmonary embolism without acute cor pulmonale (HCC)   . Alcohol abuse 11/26/2010   Qualifier: Diagnosis of  By: [name] MD, Pershing Proud  . Anemia due to blood loss, acute 07/15/2012  . CHF (congestive heart failure) ([place])   . Cholecystitis 04/18/2014  . Chronic atrial fibrillation ([place])   . Cigarette smoker    has currently quit  . Cocaine abuse ([place]) 11/26/2010   Qualifier: Diagnosis of  By: [name] MD, Benjamine Mola  has been over a year  . 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 Echo 8/13: Severe LVH, EF 40-45%, inferoposterior akinesis, grade 2 diastolic dysfunction, moderate LAE, mild RVE, mildly reduced RVSF, mild RAE; cannot rule out R atrial mass-suggest TEE or cardiac MRI  //  B. Echo 3/17: Mild LVH, EF 25-30%, diffuse HK, grade 2 diastolic dysfunction, mild MR, severe LAE,  moderately reduced RVSF, severe RAE, mild TR, moderate PI, PASP 55 mmHg    . Dyspnea   . End stage renal disease on dialysis ([place])   . ESRD (end stage renal disease) on dialysis [facility])    MWF- [place] (05/18/2017)  . Hemodialysis patient ([place])    M,W,F  . Hernia, inguinal, right 08/05/2016  . Hypertension   . 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 Hypocalcemia 07/15/2012  . Medical non-compliance   . Non-ischemic cardiomyopathy ([place])    A. R/L HC 3/17: Normal coronary arteries, moderate pulmonary hypertension (PASP 65 mmHg), elevated LV filling pressures (LVEDP 45 mmHg)   . NSVT (nonsustained ventricular tachycardia) ([place]) 02/22/2016  . NSVT (nonsustained ventricular tachycardia) ([place]) 02/22/2016  . Pneumonia 11/12/2017  . Polysubstance abuse ([place])   . Prolonged Q-T interval on ECG 05/18/2017  . Prolonged QT interval 05/18/2017  . Restless leg syndrome   . Solitary pulmonary nodule 10/10/2015   See cxr 10/09/2015 - CT rec 10/10/2015 >>>   . Upper airway cough syndrome 10/09/2015   Off ACEi around 1st Oct 2016  - Sinus CT 10/10/2015 >>>      Past Surgical History:  Procedure Laterality Date  . [place] TRANSPOSITION  01/19/2013   Procedure: BASCILIC VEIN TRANSPOSITION;  Surgeon: Conrad Gloucester, MD;  Location: [place];  Service: Vascular;  Laterality: Left;  left 2nd stage basilic vein transposition  . CARDIAC CATHETERIZATION N/A 02/25/2016   Procedure: Right/Left Heart Cath and Coronary Angiography;  Surgeon: Burnell Blanks, MD;  Location: [place] CV [facility];  Service: Cardiovascular;  Laterality: N/A;  . INGUINAL HERNIA REPAIR Right 08/05/2016   Procedure: RIGHT INGUINAL HERNIA REPAIR WITH MESH;  Surgeon: Donnie Mesa, MD;  Location: [place];  Service: General;  Laterality: Right;  .  INSERTION OF DIALYSIS CATHETER  07/17/2012   Procedure: INSERTION OF DIALYSIS CATHETER;  Surgeon: Conrad [place], MD;  Location: [place];  Service: Vascular;  Laterality: Right;  right internal jugular  . INSERTION OF MESH Right 08/05/2016   Procedure: INSERTION OF MESH;  Surgeon: Donnie Mesa, MD;  Location: [place];  Service: General;  Laterality: Right;  . REVISION OF ARTERIOVENOUS GORETEX GRAFT Left 9/38/1829   Procedure:  PLICATION OF ARTERIOVENOUS FISTULA LEFT ARM;  Surgeon: Elam Dutch, MD;  Location: [place];  Service: Vascular;  Laterality: Left;  . REVISON OF ARTERIOVENOUS FISTULA Left 01/06/2017   Procedure: REVISON OF ARTERIOVENOUS FISTULA;  Surgeon: Conrad Ledyard, MD;  Location: [place];  Service: Vascular;  Laterality: Left;  . REVISON OF ARTERIOVENOUS FISTULA Left 10/27/2018   Procedure: REVISION PLICATION OF ARTERIOVENOUS FISTULA  LEFT UPPER ARM;  Surgeon: Waynetta Sandy, MD;  Location: [place];  Service: Vascular;  Laterality: Left;  . TRACHEOSTOMY TUBE PLACEMENT N/A 11/24/2019   Procedure: TRACHEOSTOMY;  Surgeon: Izora Gala, MD;  Location: [place];  Service: ENT;  Laterality: N/A;  . UMBILICAL HERNIA REPAIR N/A 08/05/2016   Procedure: [place];  Surgeon: Donnie Mesa, MD;  Location: [place];  Service: General;  Laterality: N/A;  . VENOGRAM N/A 08/09/2012   Procedure: VENOGRAM;  Surgeon: Conrad S[place]town, MD;  Location: [facility] CATH LAB;  Service: Cardiovascular;  Laterality: N/A;    Allergies: Losartan  Medications: Prior to Admission medications   Medication Sig Start Date End Date Taking? Authorizing Provider  acetaminophen (TYLENOL) 650 MG suppository Place 1 suppository (650 mg total) rectally every 6 (six) hours as needed for fever. 12/23/19   Kathi Ludwig, MD  Amino Acids-Protein Hydrolys (FEEDING SUPPLEMENT, PRO-STAT SUGAR FREE 64,) LIQD Place 30 mLs into feeding tube 5 (five) times daily. 12/23/19   Kathi Ludwig, MD  amiodarone (PACERONE) 200 MG tablet Place 1 tablet (200 mg total) into feeding tube daily. 12/24/19   Kathi Ludwig, MD  apixaban (ELIQUIS) 5 MG TABS tablet Place 1 tablet (5 mg total) into feeding tube 2 (two) times daily. 12/23/19   Kathi Ludwig, MD  calcium acetate (PHOSLO) 667 MG capsule Take 3 capsules (2,001 mg total) by mouth 3 (three) times daily with meals. Patient taking differently: Take 1,334-3,335 mg by mouth See admin  instructions. Take 3-5 capsules (2001 - 3335 mg) by mouth 3 times daily with large meals, take 2 capsules (1334 mg) with snacks 04/21/14   Mikhail, [place], DO  clonazePAM (KLONOPIN) 1 MG tablet Place 1 tablet (1 mg total) into feeding tube 3 (three) times daily as needed (Vent weaning related anxiety). 12/23/19   Kathi Ludwig, MD  Darbepoetin Alfa (ARANESP) 100 MCG/0.5ML SOSY injection Inject 0.5 mLs (100 mcg total) into the vein every Wednesday with hemodialysis. 12/28/19   Kathi Ludwig, MD  docusate (COLACE) 50 MG/5ML liquid Place 10 mLs (100 mg total) into feeding tube daily. 12/24/19   Kathi Ludwig, MD  insulin aspart (NOVOLOG) 100 UNIT/ML injection Inject 0-9 Units into the skin every 4 (four) hours. 12/23/19   Kathi Ludwig, MD  midodrine (PROAMATINE) 10 MG tablet Place 1 tablet (10 mg total) into feeding tube 3 (three) times daily with meals. 12/23/19   Kathi Ludwig, MD  multivitamin (RENA-VIT) TABS tablet Take 1 tablet by mouth at bedtime. 12/23/19   Kathi Ludwig, MD  nutrition supplement, JUVEN, (JUVEN) PACK Place 1 packet into feeding tube 2 (two) times daily. 12/23/19   Kathi Ludwig, MD  Nutritional Supplements (FEEDING SUPPLEMENT, VITAL 1.5 CAL,) LIQD Place 1,000 mLs into feeding tube continuous. 12/23/19   Kathi Ludwig, MD  oxyCODONE (ROXICODONE) 5 MG/5ML solution Take 7.5 mLs (7.5 mg total) by mouth every 4 (four) hours as needed for moderate pain. 12/23/19   Kathi Ludwig, MD  pantoprazole sodium (PROTONIX) 40 mg/20 mL PACK Place 20 mLs (40 mg total) into feeding tube at bedtime. 12/23/19   Kathi Ludwig, MD  prochlorperazine (COMPAZINE) 5 MG tablet Place 1 tablet (5 mg total) into feeding tube every 6 (six) hours as needed for nausea or vomiting (hiccups). 12/23/19   Kathi Ludwig, MD  QUEtiapine (SEROQUEL) 25 MG tablet Take 1 tablet (25 mg total) by mouth 2 (two) times daily. 12/23/19   Kathi Ludwig, MD     Family History    Problem Relation Age of Onset  . Hypertension Mother   . Diabetes Mother   . Renal Disease Mother   . Hypertension Father     Social History   Socioeconomic History  . Marital status: Single    Spouse name: Not on file  . Number of children: Not on file  . Years of education: Not on file  . Highest education level: Not on file  Occupational History  . Not on file  Tobacco Use  . Smoking status: Former Smoker    Years: 30.00    Types: Cigarettes    Quit date: 10/20/2018    Years since quitting: 1.2  . Smokeless tobacco: Never Used  Substance and Sexual Activity  . Alcohol use: Yes    Alcohol/week: 4.0 standard drinks    Types: 4 Cans of beer per week  . Drug use: No    Types: Marijuana    Comment: 05/18/2017 "qd"  . Sexual activity: Not Currently  Other Topics Concern  . Not on file  Social History Narrative  . Not on file   Social Determinants of Health   Financial Resource Strain:   . Difficulty of Paying Living Expenses: Not on file  Food Insecurity:   . Worried About Charity fundraiser in the Last Year: Not on file  . Ran Out of Food in the Last Year: Not on file  Transportation Needs:   . Lack of Transportation (Medical): Not on file  . Lack of Transportation (Non-Medical): Not on file  Physical Activity:   . Days of Exercise per Week: Not on file  . Minutes of Exercise per Session: Not on file  Stress:   . Feeling of Stress : Not on file  Social Connections:   . Frequency of Communication with Friends and Family: Not on file  . Frequency of Social Gatherings with Friends and Family: Not on file  . Attends Religious Services: Not on file  . Active Member of Clubs or Organizations: Not on file  . Attends Archivist Meetings: Not on file  . Marital Status: Not on file     Review of Systems: A 12 point ROS discussed and pertinent positives are indicated in the HPI above.  All other systems are negative.   Vital Signs: There were no vitals  taken for this visit.  Physical Exam Vitals reviewed.  Cardiovascular:     Rate and Rhythm: Normal rate and regular rhythm.     Heart sounds: Normal heart sounds.  Pulmonary:     Effort: Pulmonary effort is normal.     Breath sounds: Normal breath sounds.  Abdominal:     Palpations: Abdomen is soft.  Musculoskeletal:        General: Normal range of motion.  Skin:    General: Skin is warm and dry.  Neurological:     Mental Status: He is alert. Mental status is at baseline.  Psychiatric:     Comments: On vent/trach Mouthing answers to questions Seems to understand G tube procedure after explanation Contacting mother for consent per pt     Imaging: CT ABDOMEN WO CONTRAST  Result Date: 01/04/2020 CLINICAL DATA:  Anatomy for possible percutaneous gastrostomy tube placement. EXAM: CT ABDOMEN WITHOUT CONTRAST TECHNIQUE: Multidetector CT imaging of the abdomen was performed following the standard protocol without IV contrast. COMPARISON:  CT dated May 18, 2018. FINDINGS: Lower chest: There are small bilateral pleural effusions.Bibasilar atelectasis is noted. The heart size is significantly enlarged. There is a large partially visualized pericardial effusion. Hepatobiliary: The liver is normal. Cholelithiasis without acute inflammation.There is no biliary ductal dilation. Pancreas: Normal contours without ductal dilatation. No peripancreatic fluid collection. Spleen: The spleen is enlarged measuring approximately 14 cm craniocaudad. Adrenals/Urinary Tract: --Adrenal glands: No adrenal hemorrhage. --Right kidney/ureter: The right kidney is atrophic with innumerable simple and hyperdense cysts. --Left kidney/ureter: The left kidney is atrophic with innumerable simple and hyperdense cysts. --Urinary bladder: Outside the field of view. Stomach/Bowel: --Stomach/Duodenum: There is an enteric feeding tube terminating near the gastric antrum. There is an air-fluid level in the moderately distended  stomach. The gastric anatomy is suitable for percutaneous gastrostomy tube placement. --Small bowel: No dilatation or inflammation. --Colon: The visualized portions of the colon are unremarkable. --Appendix: Outside the field of view. Vascular/Lymphatic: Atherosclerotic calcification is present within the non-aneurysmal abdominal aorta, without hemodynamically significant stenosis. There is a retroaortic left renal vein, a normal variant. --No retroperitoneal lymphadenopathy. --No mesenteric lymphadenopathy. Other: There are bilateral psoas complex collections. On the right the collection measures approximately 4.4 x 4.7 cm. On the left the collection measures approximately 4.2 x 3.8 cm. There is a small volume of intra-abdominal free fluid some of which appears to be relatively hyperdense. This density is likely artifactual as the density within the gallbladder is increased as well. Musculoskeletal. No acute displaced fractures. IMPRESSION: 1. Anatomy is suitable for percutaneous gastrostomy tube placement. 2. Bilateral psoas collections as detailed above favored to represent hematomas. 3. Large partially visualized pericardial effusion. 4. Small bilateral pleural effusions.  Bibasilar atelectasis. 5. Cardiomegaly. 6. Small volume free fluid in the abdomen. 7. Additional chronic findings as detailed above. These results will be called to the ordering clinician or representative by the Radiologist Assistant, and communication documented in the PACS or zVision Dashboard. Electronically Signed   By: Constance Holster M.D.   On: 01/04/2020 01:56   DG Abd 1 View  Result Date: 12/29/2019 CLINICAL DATA:  Abdominal distension. EXAM: ABDOMEN - 1 VIEW COMPARISON:  December 24, 2019. FINDINGS: The bowel gas pattern is normal. Distal tip of feeding tube is seen in expected position of second portion of duodenum. No radio-opaque calculi or other significant radiographic abnormality are seen. IMPRESSION: No evidence of bowel  obstruction or ileus. Distal tip of feeding tube is seen in expected position of second portion of duodenum. Electronically Signed   By: Marijo Conception M.D.   On: 12/29/2019 15:15   DG Abd 1 View  Result Date: 12/15/2019 CLINICAL DATA:  NG tube placement.  Abdominal distension. EXAM: ABDOMEN - 1 VIEW COMPARISON:  12/05/2019 FINDINGS: Nasogastric tube passes below the diaphragm, tip projecting in the expected location of the distal stomach or  first portion of the duodenum. There is increased bowel gas without significant bowel distension. IMPRESSION: 1. Nasogastric tube tip projects in either the distal stomach or first portion of the duodenum Electronically Signed   By: Lajean Manes M.D.   On: 12/15/2019 11:58   CT HEAD WO CONTRAST  Result Date: 12/29/2019 CLINICAL DATA:  54 year old male with encephalopathy. Tracheostomy. EXAM: CT HEAD WITHOUT CONTRAST TECHNIQUE: Contiguous axial images were obtained from the base of the skull through the vertex without intravenous contrast. COMPARISON:  Head CT 11/22/2019. FINDINGS: Brain: Cerebral volume is stable and within normal limits for age. No midline shift, ventriculomegaly, mass effect, evidence of mass lesion, intracranial hemorrhage or evidence of cortically based acute infarction. Gray-white matter differentiation is within normal limits throughout the brain. No encephalomalacia identified. Vascular: Mild Calcified atherosclerosis at the skull base. No suspicious intracranial vascular hyperdensity. Skull: No acute osseous abnormality identified. Sinuses/Orbits: Increased tympanic cavity and mastoid opacification since last month. Left petrous apex remains pneumatized. No mastoid coalescence identified. Improved paranasal sinus aeration. Left nasoenteric tube remains in place. Other: Fluid in the pharynx. No acute orbit or scalp soft tissue finding. IMPRESSION: 1. Stable and normal for age non contrast CT appearance of the brain. 2. Increased bilateral  middle ear and mastoid opacification, probably related to tracheostomy/fluid in the pharynx. 3. Left nasoenteric tube in place. Electronically Signed   By: Genevie Ann M.D.   On: 12/29/2019 02:51   DG CHEST PORT 1 VIEW  Result Date: 12/25/2019 CLINICAL DATA:  Ventilator dependence.  Pneumonia. EXAM: PORTABLE CHEST 1 VIEW COMPARISON:  12/24/2019 FINDINGS: 0820 hours. The cardio pericardial silhouette is enlarged. Tracheostomy tube remains in place. A feeding tube passes into the stomach although the distal tip position is not included on the film. Right IJ central line tip overlies the right innominate vein. Patchy bilateral airspace disease is similar to prior. No substantial pleural effusion. IMPRESSION: 1. No substantial interval change. 2. Marked enlargement cardiopericardial silhouette. 3. Patchy bilateral airspace opacities without pleural effusion. Electronically Signed   By: Misty Stanley M.D.   On: 12/25/2019 09:21   DG Chest Port 1 View  Result Date: 12/24/2019 CLINICAL DATA:  Subcutaneous emphysema. Ventilator dependence. Pneumonia. EXAM: PORTABLE CHEST 1 VIEW COMPARISON:  December 24, 2019 FINDINGS: The feeding tube terminates below today's film. The right central line terminates in the region of the right brachiocephalic vein, stable. A tracheostomy tube is stable. No pneumothorax. Bilateral pulmonary infiltrates remain, similar in the interval. Stable cardiomegaly. The hila and mediastinum are unchanged. No other acute abnormalities. IMPRESSION: 1. Support apparatus as above. 2. Bilateral pulmonary infiltrates are similar in the interval. Electronically Signed   By: Dorise Bullion III M.D   On: 12/24/2019 11:42   DG CHEST PORT 1 VIEW  Result Date: 12/24/2019 CLINICAL DATA:  54 year old male with respiratory failure. EXAM: PORTABLE ABDOMEN - 1 VIEW; PORTABLE CHEST - 1 VIEW COMPARISON:  Chest radiograph dated 12/22/2019. FINDINGS: Right IJ central venous line with tip in similar position.  Tracheostomy with tip above the carina. A feeding tube with tip in the distal stomach. No significant interval change in the appearance of the lungs and cardiomediastinal silhouette. IMPRESSION: No significant interval change in the appearance of the lungs. Feeding tube with tip in the distal stomach. Electronically Signed   By: Anner Crete M.D.   On: 12/24/2019 01:55   DG CHEST PORT 1 VIEW  Result Date: 12/22/2019 CLINICAL DATA:  54 year old male with intubation. EXAM: PORTABLE CHEST 1 VIEW COMPARISON:  Chest radiograph dated 12/18/2019. FINDINGS: Tracheostomy with tip above the carina in similar position. A feeding tube extending below the diaphragm with tip beyond the inferior margin of the image. Right IJ central venous line in similar position. Bilateral confluent airspace densities have worsened since the prior radiograph. There is no pneumothorax. Stable cardiomegaly. No acute osseous pathology. IMPRESSION: 1. Worsening bilateral airspace opacities. 2. Stable support apparatus. Electronically Signed   By: Anner Crete M.D.   On: 12/22/2019 03:57   DG CHEST PORT 1 VIEW  Result Date: 12/18/2019 CLINICAL DATA:  Pneumonia. EXAM: PORTABLE CHEST 1 VIEW COMPARISON:  December 17, 2019. FINDINGS: Stable cardiomegaly. Endotracheal, nasogastric and feeding tubes are unchanged in position. Right internal jugular catheter is unchanged. No pneumothorax is noted. Stable bibasilar atelectasis or infiltrates are noted with probable small left pleural effusion. Bony thorax is unremarkable. IMPRESSION: Stable support apparatus. Stable bibasilar atelectasis or infiltrates are noted with probable small left pleural effusion. Electronically Signed   By: Marijo Conception M.D.   On: 12/18/2019 09:56   DG CHEST PORT 1 VIEW  Result Date: 12/17/2019 CLINICAL DATA:  Pulmonary infiltrates EXAM: PORTABLE CHEST 1 VIEW COMPARISON:  Chest radiograph 12/16/2019 FINDINGS: Stable support apparatus. Stable cardiomediastinal  contours with enlarged heart size. There is improved aeration in the bilateral lungs with persistent mild opacities at the right lung base and consolidation at the left lung base. Possible small left pleural effusion. No pneumothorax. IMPRESSION: Improved aeration in the bilateral lungs.  Stable support apparatus. Electronically Signed   By: Audie Pinto M.D.   On: 12/17/2019 10:12   DG CHEST PORT 1 VIEW  Result Date: 12/16/2019 CLINICAL DATA:  Follow-up pulmonary edema. EXAM: PORTABLE CHEST 1 VIEW COMPARISON:  12/15/19 FINDINGS: The tracheostomy tube tip is above the carina. There is a right IJ catheter with tip projecting over the SVC. A feeding tube and nasogastric tube are in place with tips below the GE junction. Unchanged cardiac enlargement. Lung volumes are low. Bilateral airspace opacities and layering posterior effusions are again noted and appear unchanged. IMPRESSION: 1. No change in aeration to the lungs compared with previous exam. 2. Stable cardiac enlargement 3. Stable support apparatus. Electronically Signed   By: Kerby Moors M.D.   On: 12/16/2019 10:13   DG CHEST PORT 1 VIEW  Result Date: 12/15/2019 CLINICAL DATA:  Abdominal distension EXAM: PORTABLE CHEST 1 VIEW COMPARISON:  12/14/2019 FINDINGS: There has been interval placement of esophagogastric tube, tip and side port below the diaphragm. Other support apparatus including tracheostomy remain unchanged. No other significant change in AP portable examination with extensive heterogeneous airspace opacity, layering bilateral pleural effusions, and cardiomegaly. IMPRESSION: 1. There has been interval placement of esophagogastric tube, tip and side port below the diaphragm. 2. Other support apparatus including tracheostomy remain unchanged. 3. No other significant change in AP portable examination with extensive heterogeneous airspace opacity, layering bilateral pleural effusions, and cardiomegaly. Electronically Signed   By: Eddie Candle M.D.   On: 12/15/2019 12:04   DG CHEST PORT 1 VIEW  Result Date: 12/14/2019 CLINICAL DATA:  Subcutaneous emphysema EXAM: PORTABLE CHEST 1 VIEW COMPARISON:  12/14/2019 at 1129 hours FINDINGS: Cardiomegaly with moderate interstitial edema. Small bilateral pleural effusions. No pneumothorax. Tracheostomy in satisfactory position. Right IJ venous catheter terminates at the mid SVC. Enteric tube courses into the stomach. No subcutaneous emphysema is evident. IMPRESSION: No subcutaneous emphysema is evident. Cardiomegaly with moderate interstitial edema and small bilateral pleural effusions. Stable support apparatus as above. Electronically Signed  By: Julian Hy M.D.   On: 12/14/2019 19:32   DG CHEST PORT 1 VIEW  Result Date: 12/14/2019 CLINICAL DATA:  CHF, nonischemic cardiomyopathy, end stage renal disease and pneumonia. EXAM: PORTABLE CHEST 1 VIEW COMPARISON:  Earlier today FINDINGS: Right IJ catheter tip projects over the SVC. There is a tracheostomy tube with tip above the carina. The feeding tube tip is below the level of the GE junction. Unchanged cardiac enlargement. Diffuse bilateral interstitial and airspace densities are unchanged. IMPRESSION: 1. No change in aeration to the lungs compared with previous exam. 2. Stable support apparatus. 3. Stable cardiac enlargement. Electronically Signed   By: Kerby Moors M.D.   On: 12/14/2019 11:53   DG CHEST PORT 1 VIEW  Result Date: 12/14/2019 CLINICAL DATA:  Hypoxemia. EXAM: PORTABLE CHEST 1 VIEW COMPARISON:  Radiograph 12/12/2019. CT 11/16/2019 FINDINGS: Tracheostomy tube tip at the thoracic inlet. Weighted enteric tube in place, tip below the diaphragm not included in the field of view. Unchanged cardiomegaly. Diffuse bilateral airspace opacities with slight worsening in the interim. Bilateral pleural effusions not significantly changed. No visualized pneumothorax. IMPRESSION: 1. Worsening diffuse bilateral airspace disease, suspicious  for worsening pulmonary edema. 2. Unchanged bilateral pleural effusions. 3. Tracheostomy tube tip at the thoracic inlet. Electronically Signed   By: Keith Rake M.D.   On: 12/14/2019 05:54   DG CHEST PORT 1 VIEW  Result Date: 12/12/2019 CLINICAL DATA:  Hypoxemia EXAM: PORTABLE CHEST 1 VIEW COMPARISON:  12/07/2019 FINDINGS: Tracheostomy tube remains in place. Enteric tube courses below the diaphragm with distal tip beyond the inferior margin of the film. Stable cardiomegaly. Diffuse bilateral airspace opacities which are similar to slightly progressed compared to prior study. Small bilateral pleural effusions. No pneumothorax. IMPRESSION: 1. Stable to slight progression of diffuse bilateral airspace opacities. Small bilateral pleural effusions. 2. Stable support apparatus. Electronically Signed   By: Davina Poke D.O.   On: 12/12/2019 08:55   AM DG Chest Port 1 View  Result Date: 12/07/2019 CLINICAL DATA:  Respiratory failure EXAM: PORTABLE CHEST 1 VIEW COMPARISON:  Three days ago FINDINGS: Tracheostomy tube remains well seated. The feeding tube at least reaches the stomach. Bilateral airspace disease and layering pleural fluid with cardiomegaly. No evidence of pneumothorax. IMPRESSION: Stable hardware positioning, cardiomegaly, airspace disease, and layering pleural fluid. Electronically Signed   By: Monte Fantasia M.D.   On: 12/07/2019 06:20   DG Abd Portable 1V  Result Date: 12/24/2019 CLINICAL DATA:  54 year old male with respiratory failure. EXAM: PORTABLE ABDOMEN - 1 VIEW; PORTABLE CHEST - 1 VIEW COMPARISON:  Chest radiograph dated 12/22/2019. FINDINGS: Right IJ central venous line with tip in similar position. Tracheostomy with tip above the carina. A feeding tube with tip in the distal stomach. No significant interval change in the appearance of the lungs and cardiomediastinal silhouette. IMPRESSION: No significant interval change in the appearance of the lungs. Feeding tube with tip in  the distal stomach. Electronically Signed   By: Anner Crete M.D.   On: 12/24/2019 01:55   [place] ASCITES (ABDOMEN LIMITED)  Result Date: 01/01/2020 CLINICAL DATA:  54 year old male with ascites. EXAM: LIMITED ABDOMEN ULTRASOUND FOR ASCITES TECHNIQUE: Limited ultrasound survey for ascites was performed in all four abdominal quadrants. COMPARISON:  Abdominal ultrasound dated 05/19/2017. FINDINGS: There is a small amount of ascites in the visualized abdomen. IMPRESSION: Small ascites. Electronically Signed   By: Anner Crete M.D.   On: 01/01/2020 17:33   VAS [place] UPPER EXTREMITY VENOUS DUPLEX  Result Date: 12/06/2019  UPPER VENOUS STUDY  Indications: Swelling, and Pain Limitations: Bandages, line and patient pain tolerance. Comparison Study: no prior Performing Technologist: Abram Sander RVS  Examination Guidelines: A complete evaluation includes B-mode imaging, spectral Doppler, color Doppler, and power Doppler as needed of all accessible portions of each vessel. Bilateral testing is considered an integral part of a complete examination. Limited examinations for reoccurring indications may be performed as noted.  Right Findings: +----------+------------+---------+-----------+----------+---------------------+ RIGHT     CompressiblePhasicitySpontaneousProperties       Summary        +----------+------------+---------+-----------+----------+---------------------+ IJV                                                    Not visualized     +----------+------------+---------+-----------+----------+---------------------+ Subclavian    Full       Yes       Yes                                    +----------+------------+---------+-----------+----------+---------------------+ Axillary      Full       Yes       Yes                                    +----------+------------+---------+-----------+----------+---------------------+ Brachial                 Yes       Yes                unable to compress                                                        due to patient pain                                                            tolerance       +----------+------------+---------+-----------+----------+---------------------+ Radial        Full                                                        +----------+------------+---------+-----------+----------+---------------------+ Ulnar         Full                                                        +----------+------------+---------+-----------+----------+---------------------+ Cephalic      None  Age Indeterminate   +----------+------------+---------+-----------+----------+---------------------+ Basilic                  Yes       Yes               unable to compress                                                        due to patient pain                                                            tolerance       +----------+------------+---------+-----------+----------+---------------------+  Summary:  Right: No evidence of deep vein thrombosis in the upper extremity. Findings consistent with age indeterminate superficial vein thrombosis involving the right cephalic vein.  *See table(s) above for measurements and observations.  Diagnosing physician: Deitra Mayo MD Electronically signed by Deitra Mayo MD on 12/06/2019 at 5:20:39 PM.    Final    ECHOCARDIOGRAM LIMITED  Result Date: 12/30/2019   ECHOCARDIOGRAM LIMITED REPORT   Patient Name:   [name] [name] Date of Exam: 12/30/2019 Medical Rec #:  [id number]      Height:       70.5 in Accession #:    [id number]     Weight:       151.2 lb Date of Birth:  05-15-1966      BSA:          1.86 m Patient Age:    51 years       BP:           107/66 mmHg Patient Gender: M              HR:           82 bpm. Exam Location:  Inpatient  Procedure: Limited Echo and Limited Color Doppler  Indications:     I31.3 Pericardial effusion (noninflammatory)  History:         Patient has prior history of Echocardiogram examinations, most                  recent 12/28/2019. ESRD. Polysubstance abuse.  Sonographer:     Jonelle Sidle Dance Referring Phys:  [place] Diagnosing Phys: Dixie Dials MD  Sonographer Comments: Echo performed with patient supine and on artificial respirator. IMPRESSIONS  1. Left ventricular ejection fraction, by visual estimation, is 30 to 35%. The left ventricle has moderately decreased function. Left ventricular septal wall thickness was normal.  2. Left ventricular diastolic parameters are indeterminate.  3. Mildly dilated left ventricular internal cavity size.  4. The left ventricle demonstrates global hypokinesis.  5. Global right ventricle has severely reduced systolic function.The right ventricular size is mildly enlarged. no increase in right ventricular wall thickness.  6. Left atrial size was moderately dilated.  7. Right atrial size was mildly dilated.  8. Moderate pericardial effusion.  9. The pericardial effusion is circumferential. 10. Mild mitral valve regurgitation. 11. The tricuspid valve was normal in structure. Tricuspid valve regurgitation is mild. 12. Tricuspid valve regurgitation is mild. 13. Mildly elevated pulmonary artery systolic pressure. 14.  The inferior vena cava is dilated in size with <50% respiratory variability, suggesting right atrial pressure of 15 mmHg. FINDINGS  Left Ventricle: Left ventricular ejection fraction, by visual estimation, is 30 to 35%. The left ventricle has moderately decreased function. The left ventricle demonstrates global hypokinesis. The left ventricular internal cavity size was mildly dilated left ventricle. Left ventricular septal wall thickness was normal. Left ventricular diastolic parameters are indeterminate. Right Ventricle: The right ventricular size is mildly enlarged. No increase in right ventricular wall thickness.  Global RV systolic function is has severely reduced systolic function. Left Atrium: Left atrial size was moderately dilated. Right Atrium: Right atrial size was mildly dilated. Right atrial pressure is estimated at 8 mmHg. Pericardium: A moderately sized pericardial effusion is present is seen. A moderately sized pericardial effusion is present. The pericardial effusion is circumferential. There is no evidence of cardiac tamponade. There is a small pleural effusion. Mitral Valve: Mild mitral valve regurgitation. Tricuspid Valve: The tricuspid valve is normal in structure. Tricuspid valve regurgitation is mild. Aortic Valve: The aortic valve is tricuspid. Aortic valve regurgitation is trivial. Pulmonic Valve: The pulmonic valve was grossly normal. Pulmonic valve regurgitation is trivial by color flow Doppler. Pulmonic regurgitation is trivial by color flow Doppler. Aorta: The aortic root and ascending aorta are structurally normal, with no evidence of dilitation. Venous: The inferior vena cava is dilated in size with less than 50% respiratory variability, suggesting right atrial pressure of 15 mmHg.  LEFT VENTRICLE          Normals PLAX 2D LVIDd:         5.60 cm  3.6 cm LVIDs:         3.90 cm  1.7 cm LV PW:         0.90 cm  1.4 cm LV IVS:        0.80 cm  1.3 cm LVOT diam:     2.00 cm  2.0 cm LV SV:         88 ml    79 ml LV SV Index:   47.60    45 ml/m2 LVOT Area:     3.14 cm 3.14 cm2  RIGHT VENTRICLE          IVC RV Basal diam:  3.50 cm  IVC diam: 2.80 cm RV Mid diam:    3.00 cm LEFT ATRIUM              Index       RIGHT ATRIUM           Index LA diam:        5.00 cm  2.68 cm/m  RA Area:     21.50 cm LA Vol (A2C):   107.0 ml 57.42 ml/m RA Volume:   67.20 ml  36.06 ml/m LA Vol (A4C):   105.0 ml 56.35 ml/m LA Biplane Vol: 107.0 ml 57.42 ml/m   AORTA                 Normals Ao Root diam: 3.40 cm 31 mm Ao Asc diam:  3.30 cm 31 mm  SHUNTS Systemic Diam: 2.00 cm  Dixie Dials MD Electronically signed by Dixie Dials MD Signature Date/Time: 12/30/2019/11:03:35 AM    Final    ECHOCARDIOGRAM LIMITED  Result Date: 12/28/2019   ECHOCARDIOGRAM LIMITED REPORT   Patient Name:   [name] [name] Date of Exam: 12/28/2019 Medical Rec #:  [id number]      Height:       70.5  in Accession #:    [id number]     Weight:       151.2 lb Date of Birth:  05/22/1966      BSA:          1.86 m Patient Age:    76 years       BP:           122/108 mmHg Patient Gender: M              HR:           84 bpm. Exam Location:  Inpatient  Procedure: Limited Echo Indications:     CHF  History:         Patient has prior history of Echocardiogram examinations, most                  recent 11/17/2019. ESRD, Cor pulmonale, dialysis, vent. support,                  resp. failure.  Sonographer:     Dustin Flock RDCS Referring Phys:  [place] Diagnosing Phys: Dixie Dials MD IMPRESSIONS  1. Left ventricular ejection fraction, by visual estimation, is 20 to 25%. The left ventricle has severely decreased function. Left ventricular septal wall thickness was mildly increased. Mildly increased left ventricular posterior wall thickness. There  is mildly increased left ventricular wall thickness.  2. The left ventricle demonstrates global hypokinesis.  3. Global right ventricle has moderately reduced systolic function.  4. Left atrial size was severely dilated.  5. Right atrial size was severely dilated.  6. Moderate pericardial effusion.  7. The pericardial effusion is circumferential and new.  8. There is excessive respiratory variation in the hepatic vein spectral Doppler velocities.  9. The mitral valve is grossly normal. Moderate to severe mitral valve regurgitation. 10. The tricuspid valve was grossly normal. Tricuspid valve regurgitation moderate-severe. 11. Tricuspid valve regurgitation moderate-severe. 12. The inferior vena cava is dilated in size with <50% respiratory variability, suggesting right atrial pressure of 15 mmHg. FINDINGS  Left  Ventricle: Left ventricular ejection fraction, by visual estimation, is 20 to 25%. The left ventricle has severely decreased function. The left ventricle demonstrates global hypokinesis. The left ventricular internal cavity size was the left ventricle is normal in size. Left ventricular septal wall thickness was mildly increased. Mildly increased left ventricular posterior wall thickness. There is mildly increased left ventricular wall thickness. Right Ventricle: Global RV systolic function is has moderately reduced systolic function. Left Atrium: Left atrial size was severely dilated. Right Atrium: Right atrial size was severely dilated. Pericardium: A moderately sized pericardial effusion is present is seen. A moderately sized pericardial effusion is present. The pericardial effusion is circumferential and new. There is excessive respiratory variation in the hepatic vein spectral Doppler velocities. There is no evidence of cardiac tamponade. Mitral Valve: The mitral valve is grossly normal. Moderate to severe mitral valve regurgitation. Tricuspid Valve: The tricuspid valve is grossly normal. Tricuspid valve regurgitation moderate-severe. Aortic Valve: The aortic valve is tricuspid. Pulmonic Valve: The pulmonic valve was grossly normal. Venous: The inferior vena cava is dilated in size with less than 50% respiratory variability, suggesting right atrial pressure of 15 mmHg.  LEFT VENTRICLE         Normals PLAX 2D LVIDd:         5.70 cm 3.6 cm LV PW:         1.10 cm 1.4 cm LV IVS:  1.30 cm 1.3 cm  LV Volumes (MOD)             Normals LV area d, A4C:    36.60 cm LV area s, A4C:    32.40 cm LV major d, A4C:   8.16 cm LV major s, A4C:   7.76 cm LV vol d, MOD A4C: 135.0 ml LV vol s, MOD A4C: 113.0 ml LV SV MOD A4C:     135.0 ml  Dixie Dials MD Electronically signed by Dixie Dials MD Signature Date/Time: 12/28/2019/7:04:51 PMThe mitral valve is grossly normal.    Final     Labs:  CBC: Recent Labs     12/29/19 1221 12/30/19 0351 01/02/20 0535 01/04/20 0449  WBC 11.5* 9.8 7.8 7.1  HGB 8.5* 8.8* 8.2* 9.2*  HCT 27.7* 28.4* 27.1* 30.2*  PLT 333 324 283 278    COAGS: Recent Labs    11/16/19 1238 11/16/19 1238 11/16/19 2005 11/23/19 0445 12/17/19 0506 12/17/19 1504 12/18/19 0030 12/18/19 1004  INR 1.5*  --  1.6*  --   --   --   --   --   APTT 37*   < > 51*   < > 58* 50* 86* 77*   < > = values in this interval not displayed.    BMP: Recent Labs    12/28/19 0533 12/30/19 0351 01/02/20 0535 01/04/20 0449  NA 131* 136 137 134*  K 3.9 3.7 4.2 3.8  CL 93* 95* 96* 95*  CO2 24 24 25 24   GLUCOSE 109* 104* 94 114*  BUN 87* 68* 102* 98*  CALCIUM 9.0 9.4 9.5 9.5  CREATININE 5.32* 5.35* 6.63* 6.03*  GFRNONAA 11* 11* 9* 10*  GFRAA 13* 13* 10* 11*    LIVER FUNCTION TESTS: Recent Labs    12/10/19 0402 12/11/19 0302 12/17/19 0017 12/17/19 0506 12/19/19 0446 12/19/19 0446 12/24/19 0539 12/25/19 0350 12/28/19 0533 12/30/19 0351 01/02/20 0535 01/04/20 0449  BILITOT 0.7  --  1.1  --  1.0  --  0.8  --   --   --   --   --   AST 14*  --  16  --  11*  --  19  --   --   --   --   --   ALT 14  --  13  --  12  --  6  --   --   --   --   --   ALKPHOS 168*  --  196*  --  153*  --  249*  --   --   --   --   --   PROT 6.7  --  8.1  --  7.4  --  7.6  --   --   --   --   --   ALBUMIN 2.3*   < > 2.5*   < > 2.3*   < > 2.5*   < > 2.5* 2.7* 2.7* 3.0*   < > = values in this interval not displayed.    TUMOR MARKERS: No results for input(s): AFPTM, CEA, CA199, CHROMGRNA in the last 8760 hours.  Assessment and Plan:  Cardiac arrest x 2 ESRD Vent/trach Dysphagia Failure to thrive Will need long term care Scheduled for percutaneous gastric tube placement in IR 1/25 Additional findings on Abd CT does reveal small amount of ascites Risks were discussed with pt I have called mother- but no answer; will discuss with her risks and benefits before procedure LD  Eliquis will be  1/22    Thank you for this interesting consult.  I greatly enjoyed meeting Diana Armijo and look forward to participating in their care.  A copy of this report was sent to the requesting provider on this date.  Electronically Signed: Lavonia Drafts, PA-C 01/05/2020, 9:54 AM   I spent a total of 40 Minutes    in face to face in clinical consultation, greater than 50% of which was counseling/coordinating care for percutaneous gastric tube placement

## 2020-01-05 NOTE — Progress Notes (Signed)
Pulmonary [place]   PULMONARY CRITICAL CARE SERVICE  PROGRESS NOTE  Date of Service: 01/05/2020  Cartel Mauss  IWP:[id number]  DOB: [birth year]   DOA: 12/24/2019  Referring Physician: Merton Border, MD  HPI: [name] [name] is a 54 y.o. male seen for follow up of Acute on Chronic Respiratory Failure.  Patient is currently on assist control mode has been on 28% FiO2 with a PEEP of 5  Medications: Reviewed on Rounds  Physical Exam:  Vitals: Temperature is 96.7 pulse 90 respiratory rate 16 blood pressure is 106/82 saturations 100%  Ventilator Settings mode ventilation assist control FiO2 28% tidal volume 597 PEEP 5  . General: Comfortable at this time . Eyes: Grossly normal lids, irises & conjunctiva . ENT: grossly tongue is normal . Neck: no obvious mass . Cardiovascular: S1 S2 normal no gallop . Respiratory: No rhonchi no rales are noted at this time . Abdomen: soft . Skin: no rash seen on limited exam . Musculoskeletal: not rigid . Psychiatric:unable to assess . Neurologic: no seizure no involuntary movements         Lab Data:   Basic Metabolic Panel: Recent Labs  Lab 12/30/19 0351 01/02/20 0535 01/04/20 0449  NA 136 137 134*  K 3.7 4.2 3.8  CL 95* 96* 95*  CO2 24 25 24   GLUCOSE 104* 94 114*  BUN 68* 102* 98*  CREATININE 5.35* 6.63* 6.03*  CALCIUM 9.4 9.5 9.5  PHOS 3.8 3.4 2.8    ABG: No results for input(s): PHART, PCO2ART, PO2ART, HCO3, O2SAT in the last 168 hours.  Liver Function Tests: Recent Labs  Lab 12/30/19 0351 01/02/20 0535 01/04/20 0449  ALBUMIN 2.7* 2.7* 3.0*   No results for input(s): LIPASE, AMYLASE in the last 168 hours. No results for input(s): AMMONIA in the last 168 hours.  CBC: Recent Labs  Lab 12/29/19 1221 12/30/19 0351 01/02/20 0535 01/04/20 0449  WBC 11.5* 9.8 7.8 7.1  HGB 8.5* 8.8* 8.2* 9.2*  HCT 27.7* 28.4* 27.1* 30.2*  MCV 92.0 92.8 91.2 91.5  PLT 333 324 283 278     Cardiac Enzymes: No results for input(s): CKTOTAL, CKMB, CKMBINDEX, TROPONINI in the last 168 hours.  BNP (last 3 results) Recent Labs    11/16/19 1356  BNP 962.8*    ProBNP (last 3 results) No results for input(s): PROBNP in the last 8760 hours.  Radiological Exams: CT ABDOMEN WO CONTRAST  Result Date: 01/04/2020 CLINICAL DATA:  Anatomy for possible percutaneous gastrostomy tube placement. EXAM: CT ABDOMEN WITHOUT CONTRAST TECHNIQUE: Multidetector CT imaging of the abdomen was performed following the standard protocol without IV contrast. COMPARISON:  CT dated May 18, 2018. FINDINGS: Lower chest: There are small bilateral pleural effusions.Bibasilar atelectasis is noted. The heart size is significantly enlarged. There is a large partially visualized pericardial effusion. Hepatobiliary: The liver is normal. Cholelithiasis without acute inflammation.There is no biliary ductal dilation. Pancreas: Normal contours without ductal dilatation. No peripancreatic fluid collection. Spleen: The spleen is enlarged measuring approximately 14 cm craniocaudad. Adrenals/Urinary Tract: --Adrenal glands: No adrenal hemorrhage. --Right kidney/ureter: The right kidney is atrophic with innumerable simple and hyperdense cysts. --Left kidney/ureter: The left kidney is atrophic with innumerable simple and hyperdense cysts. --Urinary bladder: Outside the field of view. Stomach/Bowel: --Stomach/Duodenum: There is an enteric feeding tube terminating near the gastric antrum. There is an air-fluid level in the moderately distended stomach. The gastric anatomy is suitable for percutaneous gastrostomy tube placement. --Small bowel: No dilatation or inflammation. --Colon: The  visualized portions of the colon are unremarkable. --Appendix: Outside the field of view. Vascular/Lymphatic: Atherosclerotic calcification is present within the non-aneurysmal abdominal aorta, without hemodynamically significant stenosis. There is a  retroaortic left renal vein, a normal variant. --No retroperitoneal lymphadenopathy. --No mesenteric lymphadenopathy. Other: There are bilateral psoas complex collections. On the right the collection measures approximately 4.4 x 4.7 cm. On the left the collection measures approximately 4.2 x 3.8 cm. There is a small volume of intra-abdominal free fluid some of which appears to be relatively hyperdense. This density is likely artifactual as the density within the gallbladder is increased as well. Musculoskeletal. No acute displaced fractures. IMPRESSION: 1. Anatomy is suitable for percutaneous gastrostomy tube placement. 2. Bilateral psoas collections as detailed above favored to represent hematomas. 3. Large partially visualized pericardial effusion. 4. Small bilateral pleural effusions.  Bibasilar atelectasis. 5. Cardiomegaly. 6. Small volume free fluid in the abdomen. 7. Additional chronic findings as detailed above. These results will be called to the ordering clinician or representative by the Radiologist Assistant, and communication documented in the PACS or zVision Dashboard. Electronically Signed   By: Constance Holster M.D.   On: 01/04/2020 01:56   ECHOCARDIOGRAM LIMITED  Result Date: 01/05/2020   ECHOCARDIOGRAM LIMITED REPORT   Patient Name:   [name] [name] Date of Exam: 01/05/2020 Medical Rec #:  [id number]      Height:       70.5 in Accession #:    [id number]     Weight:       151.2 lb Date of Birth:  [birth year]      BSA:          1.86 m Patient Age:    79 years       BP:           122/108 mmHg Patient Gender: M              HR:           82 bpm. Exam Location:  Inpatient  Procedure: Limited Echo and Limited Color Doppler Indications:     I31.3 Pericardial effusion  History:         Patient has prior history of Echocardiogram examinations, most                  recent 12/30/2019. ESRD. Polysubstance abuse.  Sonographer:     Jonelle Sidle Dance Referring Phys:  [place] Diagnosing Phys: Dixie Dials  MD  Sonographer Comments: Echo performed with patient supine and on artificial respirator. IMPRESSIONS  1. Left ventricular ejection fraction, by visual estimation, is 40 to 45%. The left ventricle has moderately decreased function. Left ventricular septal wall thickness was normal. Normal left ventricular posterior wall thickness.  2. Moderate hypokinesis of the left ventricular, entire inferior wall.  3. Moderate hypokinesis of the left ventricular, entire lateral wall.  4. Global right ventricle has moderately reduced systolic function.The right ventricular size is mildly enlarged.  5. Left atrial size was severely dilated.  6. Right atrial size was moderately dilated.  7. Moderate pericardial effusion.  8. Moderate pleural effusion in both left and right lateral regions.  9. The pericardial effusion is circumferential. 10. The mitral valve is myxomatous. Mild mitral valve regurgitation. 11. Mildly elevated pulmonary artery systolic pressure. 12. The inferior vena cava is dilated in size with <50% respiratory variability, suggesting right atrial pressure of 15 mmHg. 13. No significant change from prior study. FINDINGS  Left Ventricle: Left ventricular ejection fraction, by visual estimation, is  40 to 45%. The left ventricle has moderately decreased function. Moderate hypokinesis of the left ventricular, entire inferior wall. Moderate hypokinesis of the left ventricular, entire lateral wall. The left ventricle demonstrates global hypokinesis. Left ventricular septal wall thickness was normal. Normal left ventricular posterior wall thickness. Left ventricular diastolic parameters are indeterminate. Right Ventricle: The right ventricular size is mildly enlarged. Global RV systolic function is has moderately reduced systolic function. Left Atrium: Left atrial size was severely dilated. Right Atrium: Right atrial size was moderately dilated. Right atrial pressure is estimated at 8 mmHg. Pericardium: A moderately sized  pericardial effusion is present is seen. A moderately sized pericardial effusion is present. The pericardial effusion is circumferential. The pericardial effusion appears to contain focal strands. There is no evidence of cardiac tamponade. There is a moderate pleural effusion in both left and right lateral regions. Mitral Valve: The mitral valve is myxomatous. Mild mitral valve regurgitation. Tricuspid Valve: The tricuspid valve is normal in structure. Tricuspid valve regurgitation is mild. Aortic Valve: Mild aortic valve sclerosis is present, with no evidence of aortic valve stenosis. Pulmonic Valve: The pulmonic valve was grossly normal. Aorta: There is moderate, atheroma plaque involving the ascending aorta. Venous: The inferior vena cava is dilated in size with less than 50% respiratory variability, suggesting right atrial pressure of 15 mmHg.  LEFT VENTRICLE          Normals PLAX 2D LVIDd:         5.97 cm  3.6 cm LVIDs:         4.61 cm  1.7 cm LV PW:         0.95 cm  1.4 cm LV IVS:        0.96 cm  1.3 cm LVOT diam:     2.20 cm  2.0 cm LV SV:         80 ml    79 ml LV SV Index:   43.46    45 ml/m2 LVOT Area:     3.80 cm 3.14 cm2  RIGHT VENTRICLE          IVC RV Basal diam:  3.36 cm  IVC diam: 2.93 cm RV Mid diam:    2.52 cm LEFT ATRIUM              Index       RIGHT ATRIUM           Index LA diam:        5.00 cm  2.68 cm/m  RA Area:     25.40 cm LA Vol (A2C):   139.0 ml 74.60 ml/m RA Volume:   89.50 ml  48.03 ml/m LA Vol (A4C):   113.0 ml 60.64 ml/m LA Biplane Vol: 125.0 ml 67.08 ml/m   AORTA                 Normals Ao Root diam: 3.30 cm 31 mm Ao Asc diam:  2.90 cm 31 mm  SHUNTS Systemic Diam: 2.20 cm  Dixie Dials MD Electronically signed by Dixie Dials MD Signature Date/Time: 01/05/2020/10:37:17 AMThe mitral valve is myxomatous.    Final     Assessment/Plan Active Problems:   Acute on chronic respiratory failure with hypoxia (HCC)   End stage renal disease on dialysis (HCC)   Acute on chronic  systolic and diastolic heart failure, NYHA class 4 (HCC)   Chronic atrial fibrillation (HCC)   Acute pulmonary embolism without acute cor pulmonale ([place])   1. Acute on chronic respiratory failure  hypoxia plan continue with full support on the ventilator patient has been not tolerating weaning at all.  Had an echocardiogram done this morning will await results 2. End-stage renal disease on hemodialysis followed by nephrology 3. Acute on chronic systolic and diastolic heart failure echocardiogram repeated will follow up 4. Chronic atrial fibrillation rate controlled 5. Pulmonary embolism treated we will continue with supportive care   I have personally seen and evaluated the patient, evaluated laboratory and imaging results, formulated the assessment and plan and placed orders. The Patient requires high complexity decision making with multiple systems involvement.  Rounds were done with the Respiratory Therapy Director and Staff therapists and discussed with nursing staff also.  Allyne Gee, MD [facility] Pulmonary Critical Care Medicine Sleep Medicine

## 2020-01-06 DIAGNOSIS — I5040 Unspecified combined systolic (congestive) and diastolic (congestive) heart failure: Secondary | ICD-10-CM | POA: Diagnosis not present

## 2020-01-06 DIAGNOSIS — D631 Anemia in chronic kidney disease: Secondary | ICD-10-CM | POA: Diagnosis not present

## 2020-01-06 DIAGNOSIS — Z9911 Dependence on respirator [ventilator] status: Secondary | ICD-10-CM | POA: Diagnosis not present

## 2020-01-06 DIAGNOSIS — N186 End stage renal disease: Secondary | ICD-10-CM | POA: Diagnosis not present

## 2020-01-06 DIAGNOSIS — N2581 Secondary hyperparathyroidism of renal origin: Secondary | ICD-10-CM | POA: Diagnosis not present

## 2020-01-06 DIAGNOSIS — I2699 Other pulmonary embolism without acute cor pulmonale: Secondary | ICD-10-CM | POA: Diagnosis not present

## 2020-01-06 DIAGNOSIS — J9621 Acute and chronic respiratory failure with hypoxia: Secondary | ICD-10-CM | POA: Diagnosis not present

## 2020-01-06 DIAGNOSIS — I5021 Acute systolic (congestive) heart failure: Secondary | ICD-10-CM | POA: Diagnosis not present

## 2020-01-06 DIAGNOSIS — I959 Hypotension, unspecified: Secondary | ICD-10-CM | POA: Diagnosis not present

## 2020-01-06 DIAGNOSIS — Z992 Dependence on renal dialysis: Secondary | ICD-10-CM | POA: Diagnosis not present

## 2020-01-06 DIAGNOSIS — I5043 Acute on chronic combined systolic (congestive) and diastolic (congestive) heart failure: Secondary | ICD-10-CM | POA: Diagnosis not present

## 2020-01-06 DIAGNOSIS — I482 Chronic atrial fibrillation, unspecified: Secondary | ICD-10-CM | POA: Diagnosis not present

## 2020-01-06 DIAGNOSIS — I313 Pericardial effusion (noninflammatory): Secondary | ICD-10-CM | POA: Diagnosis not present

## 2020-01-06 DIAGNOSIS — I4821 Permanent atrial fibrillation: Secondary | ICD-10-CM | POA: Diagnosis not present

## 2020-01-06 DIAGNOSIS — J9690 Respiratory failure, unspecified, unspecified whether with hypoxia or hypercapnia: Secondary | ICD-10-CM | POA: Diagnosis not present

## 2020-01-06 LAB — RENAL FUNCTION PANEL
Albumin: 3 g/dL — ABNORMAL LOW (ref 3.5–5.0)
Anion gap: 16 — ABNORMAL HIGH (ref 5–15)
BUN: 97 mg/dL — ABNORMAL HIGH (ref 6–20)
CO2: 23 mmol/L (ref 22–32)
Calcium: 9.6 mg/dL (ref 8.9–10.3)
Chloride: 94 mmol/L — ABNORMAL LOW (ref 98–111)
Creatinine, Ser: 5.73 mg/dL — ABNORMAL HIGH (ref 0.61–1.24)
GFR calc Af Amer: 12 mL/min — ABNORMAL LOW (ref >60)
GFR calc non Af Amer: 11 mL/min — ABNORMAL LOW (ref >60)
Glucose, Bld: 98 mg/dL (ref 70–99)
Phosphorus: 2 mg/dL — ABNORMAL LOW (ref 2.5–4.6)
Potassium: 4 mmol/L (ref 3.5–5.1)
Sodium: 133 mmol/L — ABNORMAL LOW (ref 135–145)

## 2020-01-06 LAB — CBC
HCT: 29 % — ABNORMAL LOW (ref 39.0–52.0)
Hemoglobin: 8.9 g/dL — ABNORMAL LOW (ref 13.0–17.0)
MCH: 27.6 pg (ref 26.0–34.0)
MCHC: 30.7 g/dL (ref 30.0–36.0)
MCV: 90.1 fL (ref 80.0–100.0)
Platelets: 274 10*3/uL (ref 150–400)
RBC: 3.22 MIL/uL — ABNORMAL LOW (ref 4.22–5.81)
RDW: 16.5 % — ABNORMAL HIGH (ref 11.5–15.5)
WBC: 7 10*3/uL (ref 4.0–10.5)
nRBC: 0 % (ref 0.0–0.2)

## 2020-01-06 NOTE — Progress Notes (Signed)
[facility]  ROUNDING NOTE   Subjective:  Patient seen and evaluated during hemodialysis. Ultrafiltration target reduced to 1 kg.    Objective:  Vital signs in last 24 hours:  Temperature 97.4 pulse 81 respirations 20 blood pressure 102/70  Physical Exam: General: Chronically ill-appearing  Head: Normocephalic, atraumatic. Moist oral mucosal membranes  Eyes: Anicteric  Neck: Tracheostomy present  Lungs:  Scattered rhonchi  Heart: S1S2 no rubs  Abdomen:  Soft, nontender, bowel sounds present, distended  Extremities: Trace peripheral edema.  Neurologic: Awake, alert, following commands  Skin: Right and left toes covered  Access: Right IJ temporary dialysis catheter    Basic Metabolic Panel: Recent Labs  Lab 01/02/20 0535 01/04/20 0449 01/06/20 0512  NA 137 134* 133*  K 4.2 3.8 4.0  CL 96* 95* 94*  CO2 25 24 23   GLUCOSE 94 114* 98  BUN 102* 98* 97*  CREATININE 6.63* 6.03* 5.73*  CALCIUM 9.5 9.5 9.6  PHOS 3.4 2.8 2.0*    Liver Function Tests: Recent Labs  Lab 01/02/20 0535 01/04/20 0449 01/06/20 0512  ALBUMIN 2.7* 3.0* 3.0*   No results for input(s): LIPASE, AMYLASE in the last 168 hours. No results for input(s): AMMONIA in the last 168 hours.  CBC: Recent Labs  Lab 01/02/20 0535 01/04/20 0449 01/06/20 0512  WBC 7.8 7.1 7.0  HGB 8.2* 9.2* 8.9*  HCT 27.1* 30.2* 29.0*  MCV 91.2 91.5 90.1  PLT 283 278 274    Cardiac Enzymes: No results for input(s): CKTOTAL, CKMB, CKMBINDEX, TROPONINI in the last 168 hours.  BNP: Invalid input(s): POCBNP  CBG: No results for input(s): GLUCAP in the last 168 hours.  Microbiology: Results for orders placed or performed during the hospital encounter of 11/16/19  SARS Coronavirus 2 by RT PCR (hospital order, performed in [facility] hospital lab) Nasopharyngeal Nasopharyngeal Swab     Status: None   Collection Time: 11/16/19 11:41 AM   Specimen: Nasopharyngeal Swab  Result Value Ref Range Status   SARS Coronavirus 2 NEGATIVE NEGATIVE Final    Comment: (NOTE) SARS-CoV-2 target nucleic acids are NOT DETECTED. The SARS-CoV-2 RNA is generally detectable in upper and lower respiratory specimens during the acute phase of infection. The lowest concentration of SARS-CoV-2 viral copies this assay can detect is 250 copies / mL. A negative result does not preclude SARS-CoV-2 infection and should not be used as the sole basis for treatment or other patient management decisions.  A negative result may occur with improper specimen collection / handling, submission of specimen other than nasopharyngeal swab, presence of viral mutation(s) within the areas targeted by this assay, and inadequate number of viral copies (<250 copies / mL). A negative result must be combined with clinical observations, patient history, and epidemiological information. Fact Sheet for Patients:   StrictlyIdeas.no Fact Sheet for Healthcare Providers: BankingDealers.co.za This test is not yet approved or cleared  by the [place] FDA and has been authorized for detection and/or diagnosis of SARS-CoV-2 by FDA under an Emergency Use Authorization (EUA).  This EUA will remain in effect (meaning this test can be used) for the duration of the COVID-19 declaration under Section 564(b)(1) of the Act, 21 U.S.C. section 360bbb-3(b)(1), unless the authorization is terminated or revoked sooner. Performed at [facility], [place] [address], [place], [place] [address]   Culture, blood (routine x 2)     Status: None   Collection Time: 11/16/19  6:03 PM   Specimen: BLOOD RIGHT HAND  Result Value Ref  Range Status   Specimen Description BLOOD RIGHT HAND  Final   Special Requests   Final    BOTTLES DRAWN AEROBIC AND ANAEROBIC Blood Culture results may not be optimal due to an inadequate volume of blood received in culture bottles   Culture   Final    NO GROWTH 5 DAYS Performed at  [facility], [place] [address], [place], [place] [address]    Report Status 11/21/2019 FINAL  Final  MRSA PCR Screening     Status: None   Collection Time: 11/16/19  6:05 PM   Specimen: Nasal Mucosa; Nasopharyngeal  Result Value Ref Range Status   MRSA by PCR NEGATIVE NEGATIVE Final    Comment:        The GeneXpert MRSA Assay (FDA approved for NASAL specimens only), is one component of a comprehensive MRSA colonization surveillance program. It is not intended to diagnose MRSA infection nor to guide or monitor treatment for MRSA infections. Performed at [facility], [place] [address], [place], [place] [address]   Culture, blood (routine x 2)     Status: None   Collection Time: 11/16/19  6:08 PM   Specimen: BLOOD RIGHT HAND  Result Value Ref Range Status   Specimen Description BLOOD RIGHT HAND  Final   Special Requests   Final    BOTTLES DRAWN AEROBIC ONLY Blood Culture results may not be optimal due to an inadequate volume of blood received in culture bottles   Culture   Final    NO GROWTH 5 DAYS Performed at [facility], [place] [address], [place], [place] [address]    Report Status 11/21/2019 FINAL  Final  Culture, respiratory (non-expectorated)     Status: None   Collection Time: 11/18/19 12:28 PM   Specimen: Tracheal Aspirate; Respiratory  Result Value Ref Range Status   Specimen Description TRACHEAL ASPIRATE  Final   Special Requests NONE  Final   Gram Stain   Final    ABUNDANT WBC PRESENT,BOTH PMN AND MONONUCLEAR ABUNDANT GRAM POSITIVE COCCI ABUNDANT GRAM NEGATIVE RODS FEW GRAM VARIABLE ROD    Culture   Final    ABUNDANT HAEMOPHILUS INFLUENZAE BETA LACTAMASE POSITIVE Performed at [facility], [place] [address] [place], [place] [address]    Report Status 11/20/2019 FINAL  Final  Surgical pcr screen     Status: Abnormal   Collection Time: 11/24/19  7:53 AM   Specimen: Nasal Mucosa; Nasal Swab  Result Value Ref Range Status   MRSA, PCR NEGATIVE  NEGATIVE Final   Staphylococcus aureus POSITIVE (A) NEGATIVE Final    Comment: (NOTE) The Xpert SA Assay (FDA approved for NASAL specimens in patients 26 years of age and older), is one component of a comprehensive surveillance program. It is not intended to diagnose infection nor to guide or monitor treatment. Performed at [facility], [place] [address] [place], [place] [address]   Culture, blood (routine x 2)     Status: None   Collection Time: 11/25/19  3:30 PM   Specimen: BLOOD  Result Value Ref Range Status   Specimen Description BLOOD RIGHT ANTECUBITAL  Final   Special Requests   Final    BOTTLES DRAWN AEROBIC ONLY Blood Culture results may not be optimal due to an inadequate volume of blood received in culture bottles   Culture   Final    NO GROWTH 5 DAYS Performed at [facility], [place] [address] [place], [place] [address]  Report Status 11/30/2019 FINAL  Final  Culture, blood (routine x 2)     Status: None   Collection Time: 11/25/19  3:30 PM   Specimen: BLOOD RIGHT HAND  Result Value Ref Range Status   Specimen Description BLOOD RIGHT HAND  Final   Special Requests   Final    BOTTLES DRAWN AEROBIC AND ANAEROBIC Blood Culture results may not be optimal due to an inadequate volume of blood received in culture bottles   Culture   Final    NO GROWTH 5 DAYS Performed at [facility], [place] [address] [place], [place] [address]    Report Status 11/30/2019 FINAL  Final  Culture, respiratory (non-expectorated)     Status: None   Collection Time: 11/25/19  4:35 PM   Specimen: Tracheal Aspirate; Respiratory  Result Value Ref Range Status   Specimen Description TRACHEAL ASPIRATE  Final   Special Requests NONE  Final   Gram Stain   Final    ABUNDANT WBC PRESENT,BOTH PMN AND MONONUCLEAR FEW GRAM POSITIVE COCCI Performed at [facility], [place] [address] [place] [address], [place], [place] [address]    Culture FEW STAPHYLOCOCCUS EPIDERMIDIS  Final   Report Status  11/28/2019 FINAL  Final   Organism ID, Bacteria STAPHYLOCOCCUS EPIDERMIDIS  Final      Susceptibility   Staphylococcus epidermidis - MIC*    CIPROFLOXACIN <=0.5 SENSITIVE Sensitive     ERYTHROMYCIN >=8 RESISTANT Resistant     GENTAMICIN <=0.5 SENSITIVE Sensitive     OXACILLIN >=4 RESISTANT Resistant     TETRACYCLINE <=1 SENSITIVE Sensitive     VANCOMYCIN 2 SENSITIVE Sensitive     TRIMETH/SULFA <=10 SENSITIVE Sensitive     CLINDAMYCIN >=8 RESISTANT Resistant     RIFAMPIN <=0.5 SENSITIVE Sensitive     Inducible Clindamycin NEGATIVE Sensitive     * FEW STAPHYLOCOCCUS EPIDERMIDIS  Culture, respiratory (non-expectorated)     Status: None   Collection Time: 12/15/19  4:12 PM   Specimen: Tracheal Aspirate; Respiratory  Result Value Ref Range Status   Specimen Description TRACHEAL ASPIRATE  Final   Special Requests NONE  Final   Gram Stain   Final    MODERATE WBC PRESENT, PREDOMINANTLY PMN MODERATE GRAM POSITIVE COCCI IN PAIRS IN CLUSTERS Performed at [facility], [address] [place], [place] [address]    Culture MODERATE STAPHYLOCOCCUS AUREUS  Final   Report Status 12/17/2019 FINAL  Final   Organism ID, Bacteria STAPHYLOCOCCUS AUREUS  Final      Susceptibility   Staphylococcus aureus - MIC*    CIPROFLOXACIN <=0.5 SENSITIVE Sensitive     ERYTHROMYCIN <=0.25 SENSITIVE Sensitive     GENTAMICIN <=0.5 SENSITIVE Sensitive     OXACILLIN <=0.25 SENSITIVE Sensitive     TETRACYCLINE <=1 SENSITIVE Sensitive     VANCOMYCIN 1 SENSITIVE Sensitive     TRIMETH/SULFA <=10 SENSITIVE Sensitive     CLINDAMYCIN <=0.25 SENSITIVE Sensitive     RIFAMPIN <=0.5 SENSITIVE Sensitive     Inducible Clindamycin NEGATIVE Sensitive     * MODERATE STAPHYLOCOCCUS AUREUS  MRSA PCR Screening     Status: None   Collection Time: 12/17/19 10:25 AM   Specimen: Nasopharyngeal  Result Value Ref Range Status   MRSA by PCR NEGATIVE NEGATIVE Final    Comment:        The GeneXpert MRSA Assay (FDA approved for  NASAL specimens only), is one component of a comprehensive MRSA colonization surveillance program. It is not intended to diagnose MRSA infection  nor to guide or monitor treatment for MRSA infections. Performed at [facility], [place] [address] [place], [place] [address]   Culture, blood (routine x 2)     Status: None   Collection Time: 12/17/19  3:15 PM   Specimen: BLOOD  Result Value Ref Range Status   Specimen Description BLOOD RIGHT ANTECUBITAL  Final   Special Requests   Final    BOTTLES DRAWN AEROBIC AND ANAEROBIC Blood Culture results may not be optimal due to an inadequate volume of blood received in culture bottles   Culture   Final    NO GROWTH 5 DAYS Performed at [facility], [place] [address], [place], [place] [address]    Report Status 12/22/2019 FINAL  Final  Culture, blood (routine x 2)     Status: None   Collection Time: 12/17/19  3:20 PM   Specimen: BLOOD RIGHT HAND  Result Value Ref Range Status   Specimen Description BLOOD RIGHT HAND  Final   Special Requests   Final    BOTTLES DRAWN AEROBIC ONLY Blood Culture results may not be optimal due to an inadequate volume of blood received in culture bottles   Culture   Final    NO GROWTH 5 DAYS Performed at [facility], [place] [address], [place], [place] [address]    Report Status 12/22/2019 FINAL  Final    Coagulation Studies: No results for input(s): LABPROT, INR in the last 72 hours.  Urinalysis: No results for input(s): COLORURINE, LABSPEC, PHURINE, GLUCOSEU, HGBUR, BILIRUBINUR, KETONESUR, PROTEINUR, UROBILINOGEN, NITRITE, LEUKOCYTESUR in the last 72 hours.  Invalid input(s): APPERANCEUR    Imaging: ECHOCARDIOGRAM LIMITED  Result Date: 01/05/2020   ECHOCARDIOGRAM LIMITED REPORT   Patient Name:   LAVONTA TILLIS Date of Exam: 01/05/2020 Medical Rec #:  [id number]      Height:       70.5 in Accession #:    [id number]     Weight:       151.2 lb Date of Birth:  04-Apr-1966      BSA:          1.86 m  Patient Age:    54 years       BP:           122/108 mmHg Patient Gender: M              HR:           82 bpm. Exam Location:  Inpatient  Procedure: Limited Echo and Limited Color Doppler Indications:     I31.3 Pericardial effusion  History:         Patient has prior history of Echocardiogram examinations, most                  recent 12/30/2019. ESRD. Polysubstance abuse.  Sonographer:     Jonelle Sidle Dance Referring Phys:  [place] Diagnosing Phys: Dixie Dials MD  Sonographer Comments: Echo performed with patient supine and on artificial respirator. IMPRESSIONS  1. Left ventricular ejection fraction, by visual estimation, is 40 to 45%. The left ventricle has moderately decreased function. Left ventricular septal wall thickness was normal. Normal left ventricular posterior wall thickness.  2. Moderate hypokinesis of the left ventricular, entire inferior wall.  3. Moderate hypokinesis of the left ventricular, entire lateral wall.  4. Global right ventricle has moderately reduced systolic function.The right ventricular size is mildly enlarged.  5. Left atrial size was severely dilated.  6. Right atrial size was  moderately dilated.  7. Moderate pericardial effusion.  8. Moderate pleural effusion in both left and right lateral regions.  9. The pericardial effusion is circumferential. 10. The mitral valve is myxomatous. Mild mitral valve regurgitation. 11. Mildly elevated pulmonary artery systolic pressure. 12. The inferior vena cava is dilated in size with <50% respiratory variability, suggesting right atrial pressure of 15 mmHg. 13. No significant change from prior study. FINDINGS  Left Ventricle: Left ventricular ejection fraction, by visual estimation, is 40 to 45%. The left ventricle has moderately decreased function. Moderate hypokinesis of the left ventricular, entire inferior wall. Moderate hypokinesis of the left ventricular, entire lateral wall. The left ventricle demonstrates global hypokinesis. Left  ventricular septal wall thickness was normal. Normal left ventricular posterior wall thickness. Left ventricular diastolic parameters are indeterminate. Right Ventricle: The right ventricular size is mildly enlarged. Global RV systolic function is has moderately reduced systolic function. Left Atrium: Left atrial size was severely dilated. Right Atrium: Right atrial size was moderately dilated. Right atrial pressure is estimated at 8 mmHg. Pericardium: A moderately sized pericardial effusion is present is seen. A moderately sized pericardial effusion is present. The pericardial effusion is circumferential. The pericardial effusion appears to contain focal strands. There is no evidence of cardiac tamponade. There is a moderate pleural effusion in both left and right lateral regions. Mitral Valve: The mitral valve is myxomatous. Mild mitral valve regurgitation. Tricuspid Valve: The tricuspid valve is normal in structure. Tricuspid valve regurgitation is mild. Aortic Valve: Mild aortic valve sclerosis is present, with no evidence of aortic valve stenosis. Pulmonic Valve: The pulmonic valve was grossly normal. Aorta: There is moderate, atheroma plaque involving the ascending aorta. Venous: The inferior vena cava is dilated in size with less than 50% respiratory variability, suggesting right atrial pressure of 15 mmHg.  LEFT VENTRICLE          Normals PLAX 2D LVIDd:         5.97 cm  3.6 cm LVIDs:         4.61 cm  1.7 cm LV PW:         0.95 cm  1.4 cm LV IVS:        0.96 cm  1.3 cm LVOT diam:     2.20 cm  2.0 cm LV SV:         80 ml    79 ml LV SV Index:   43.46    45 ml/m2 LVOT Area:     3.80 cm 3.14 cm2  RIGHT VENTRICLE          IVC RV Basal diam:  3.36 cm  IVC diam: 2.93 cm RV Mid diam:    2.52 cm LEFT ATRIUM              Index       RIGHT ATRIUM           Index LA diam:        5.00 cm  2.68 cm/m  RA Area:     25.40 cm LA Vol (A2C):   139.0 ml 74.60 ml/m RA Volume:   89.50 ml  48.03 ml/m LA Vol (A4C):   113.0 ml  60.64 ml/m LA Biplane Vol: 125.0 ml 67.08 ml/m   AORTA                 Normals Ao Root diam: 3.30 cm 31 mm Ao Asc diam:  2.90 cm 31 mm  SHUNTS Systemic Diam: 2.20 cm  Dixie Dials MD  Electronically signed by Dixie Dials MD Signature Date/Time: 01/05/2020/10:37:17 AMThe mitral valve is myxomatous.    Final      Medications:       Assessment/ Plan:  54 y.o. male with a PMHx of recent cardiac arrest status post hypothermia protocol, ESRD on HD MWF, anemia of chronic kidney disease, secondary hyperparathyroidism, history of polysubstance abuse, hypertension,, chronic systolic heart failure, medical nonadherence who was admitted to [facility] on 12/24/2019 for ongoing management of acute respiratory failure.  1.  ESRD on HD MWF.  Patient seen during dialysis treatment today.  Tolerating well.  Reduced ultrafiltration target of 1 kg.  Patient on norepinephrine during dialysis treatment.  2.  Anemia of chronic kidney disease.  Hemoglobin currently 8.9.  Maintain the patient on Retacrit.  3.  Secondary hyperparathyroidism.  Phosphorus a bit lower today at 2.0.  Reevaluate phosphorus on Monday..  4.  Hyperkalemia.  Potassium currently 4.0 and acceptable.  5.  Hypotension.  Patient maintained on norepinephrine during dialysis treatment and we also use albumin to help support blood pressure..   LOS: 0 Doreene Forrey 1/22/20218:19 AM

## 2020-01-06 NOTE — Progress Notes (Signed)
Pulmonary [place]   PULMONARY CRITICAL CARE SERVICE  PROGRESS NOTE  Date of Service: 01/06/2020  [name] [name]  LGX:[id number]  DOB: 1966-10-01   DOA: 12/24/2019  Referring Physician: Merton Border, MD  HPI: [name] [name] is a 54 y.o. male seen for follow up of Acute on Chronic Respiratory Failure.  Patient at this time is on full support on assist control mode has been on 20% FiO2 with a PEEP of 5  Medications: Reviewed on Rounds  Physical Exam:  Vitals: Temperature is 98.2 pulse 50 respiratory 19 blood pressure is 124/65 saturations 98%  Ventilator Settings mode ventilation assist control FiO2 28% tidal volume 590 PEEP 5  . General: Comfortable at this time . Eyes: Grossly normal lids, irises & conjunctiva . ENT: grossly tongue is normal . Neck: no obvious mass . Cardiovascular: S1 S2 normal no gallop . Respiratory: Coarse breath sounds with few scattered rhonchi . Abdomen: soft . Skin: no rash seen on limited exam . Musculoskeletal: not rigid . Psychiatric:unable to assess . Neurologic: no seizure no involuntary movements         Lab Data:   Basic Metabolic Panel: Recent Labs  Lab 01/02/20 0535 01/04/20 0449 01/06/20 0512  NA 137 134* 133*  K 4.2 3.8 4.0  CL 96* 95* 94*  CO2 25 24 23   GLUCOSE 94 114* 98  BUN 102* 98* 97*  CREATININE 6.63* 6.03* 5.73*  CALCIUM 9.5 9.5 9.6  PHOS 3.4 2.8 2.0*    ABG: No results for input(s): PHART, PCO2ART, PO2ART, HCO3, O2SAT in the last 168 hours.  Liver Function Tests: Recent Labs  Lab 01/02/20 0535 01/04/20 0449 01/06/20 0512  ALBUMIN 2.7* 3.0* 3.0*   No results for input(s): LIPASE, AMYLASE in the last 168 hours. No results for input(s): AMMONIA in the last 168 hours.  CBC: Recent Labs  Lab 01/02/20 0535 01/04/20 0449 01/06/20 0512  WBC 7.8 7.1 7.0  HGB 8.2* 9.2* 8.9*  HCT 27.1* 30.2* 29.0*  MCV 91.2 91.5 90.1  PLT 283 278 274    Cardiac Enzymes: No  results for input(s): CKTOTAL, CKMB, CKMBINDEX, TROPONINI in the last 168 hours.  BNP (last 3 results) Recent Labs    11/16/19 1356  BNP 962.8*    ProBNP (last 3 results) No results for input(s): PROBNP in the last 8760 hours.  Radiological Exams: ECHOCARDIOGRAM LIMITED  Result Date: 01/05/2020   ECHOCARDIOGRAM LIMITED REPORT   Patient Name:   [name] [name] Date of Exam: 01/05/2020 Medical Rec #:  [id number]      Height:       70.5 in Accession #:    [id number]     Weight:       151.2 lb Date of Birth:  09-Apr-1966      BSA:          1.86 m Patient Age:    69 years       BP:           122/108 mmHg Patient Gender: M              HR:           82 bpm. Exam Location:  Inpatient  Procedure: Limited Echo and Limited Color Doppler Indications:     I31.3 Pericardial effusion  History:         Patient has prior history of Echocardiogram examinations, most  recent 12/30/2019. ESRD. Polysubstance abuse.  Sonographer:     Jonelle Sidle Dance Referring Phys:  [place] Diagnosing Phys: Dixie Dials MD  Sonographer Comments: Echo performed with patient supine and on artificial respirator. IMPRESSIONS  1. Left ventricular ejection fraction, by visual estimation, is 40 to 45%. The left ventricle has moderately decreased function. Left ventricular septal wall thickness was normal. Normal left ventricular posterior wall thickness.  2. Moderate hypokinesis of the left ventricular, entire inferior wall.  3. Moderate hypokinesis of the left ventricular, entire lateral wall.  4. Global right ventricle has moderately reduced systolic function.The right ventricular size is mildly enlarged.  5. Left atrial size was severely dilated.  6. Right atrial size was moderately dilated.  7. Moderate pericardial effusion.  8. Moderate pleural effusion in both left and right lateral regions.  9. The pericardial effusion is circumferential. 10. The mitral valve is myxomatous. Mild mitral valve regurgitation. 11. Mildly  elevated pulmonary artery systolic pressure. 12. The inferior vena cava is dilated in size with <50% respiratory variability, suggesting right atrial pressure of 15 mmHg. 13. No significant change from prior study. FINDINGS  Left Ventricle: Left ventricular ejection fraction, by visual estimation, is 40 to 45%. The left ventricle has moderately decreased function. Moderate hypokinesis of the left ventricular, entire inferior wall. Moderate hypokinesis of the left ventricular, entire lateral wall. The left ventricle demonstrates global hypokinesis. Left ventricular septal wall thickness was normal. Normal left ventricular posterior wall thickness. Left ventricular diastolic parameters are indeterminate. Right Ventricle: The right ventricular size is mildly enlarged. Global RV systolic function is has moderately reduced systolic function. Left Atrium: Left atrial size was severely dilated. Right Atrium: Right atrial size was moderately dilated. Right atrial pressure is estimated at 8 mmHg. Pericardium: A moderately sized pericardial effusion is present is seen. A moderately sized pericardial effusion is present. The pericardial effusion is circumferential. The pericardial effusion appears to contain focal strands. There is no evidence of cardiac tamponade. There is a moderate pleural effusion in both left and right lateral regions. Mitral Valve: The mitral valve is myxomatous. Mild mitral valve regurgitation. Tricuspid Valve: The tricuspid valve is normal in structure. Tricuspid valve regurgitation is mild. Aortic Valve: Mild aortic valve sclerosis is present, with no evidence of aortic valve stenosis. Pulmonic Valve: The pulmonic valve was grossly normal. Aorta: There is moderate, atheroma plaque involving the ascending aorta. Venous: The inferior vena cava is dilated in size with less than 50% respiratory variability, suggesting right atrial pressure of 15 mmHg.  LEFT VENTRICLE          Normals PLAX 2D LVIDd:          5.97 cm  3.6 cm LVIDs:         4.61 cm  1.7 cm LV PW:         0.95 cm  1.4 cm LV IVS:        0.96 cm  1.3 cm LVOT diam:     2.20 cm  2.0 cm LV SV:         80 ml    79 ml LV SV Index:   43.46    45 ml/m2 LVOT Area:     3.80 cm 3.14 cm2  RIGHT VENTRICLE          IVC RV Basal diam:  3.36 cm  IVC diam: 2.93 cm RV Mid diam:    2.52 cm LEFT ATRIUM  Index       RIGHT ATRIUM           Index LA diam:        5.00 cm  2.68 cm/m  RA Area:     25.40 cm LA Vol (A2C):   139.0 ml 74.60 ml/m RA Volume:   89.50 ml  48.03 ml/m LA Vol (A4C):   113.0 ml 60.64 ml/m LA Biplane Vol: 125.0 ml 67.08 ml/m   AORTA                 Normals Ao Root diam: 3.30 cm 31 mm Ao Asc diam:  2.90 cm 31 mm  SHUNTS Systemic Diam: 2.20 cm  Dixie Dials MD Electronically signed by Dixie Dials MD Signature Date/Time: 01/05/2020/10:37:17 AMThe mitral valve is myxomatous.    Final     Assessment/Plan Active Problems:   Acute on chronic respiratory failure with hypoxia (HCC)   End stage renal disease on dialysis (HCC)   Acute on chronic systolic and diastolic heart failure, NYHA class 4 (HCC)   Chronic atrial fibrillation (HCC)   Acute pulmonary embolism without acute cor pulmonale ([place])   1. Acute on chronic respiratory failure with hypoxia plan continue with full support on the ventilator for now 2. End-stage renal disease on hemodialysis followed by nephrology 3. Acute on chronic systolic heart failure we will follow cardiology recommendations continue supportive care 4. Chronic atrial fibrillation rate controlled 5. Pulmonary embolism treated with follow   I have personally seen and evaluated the patient, evaluated laboratory and imaging results, formulated the assessment and plan and placed orders. The Patient requires high complexity decision making with multiple systems involvement.  Rounds were done with the Respiratory Therapy Director and Staff therapists and discussed with nursing staff also.  Allyne Gee, MD  [facility] Pulmonary Critical Care Medicine Sleep Medicine

## 2020-01-06 NOTE — Consult Note (Signed)
Ref: Jean Rosenthal, MD   Subjective:  Awake. Going through hemodialysis. Repeat echo shows moderate pericardial effusion, essentially unchanged from before.   Objective:  Vital Signs in the last 24 hours:    Physical Exam: BP Readings from Last 1 Encounters:  12/23/19 (!) 122/108     Wt Readings from Last 1 Encounters:  12/22/19 68.6 kg    Weight change:  There is no height or weight on file to calculate BMI. HEENT: [place]/AT, Eyes-Brown,  Conjunctiva-Pink, Sclera-Non-icteric Neck: No JVD, No bruit, Trachea midline. Lungs:  Clearing, Bilateral. Cardiac:  Regular rhythm, normal S1 and S2, no S3. II/VI systolic murmur. Abdomen:  Soft, non-tender. BS present. Extremities:  No edema present. No cyanosis. No clubbing. Bilateral feet gangrene. CNS: AxOx3, Cranial nerves grossly intact, moves all 4 extremities.  Skin: dry.   Intake/Output from previous day: No intake/output data recorded.    Lab Results: BMET    Component Value Date/Time   NA 133 (L) 01/06/2020 0512   NA 134 (L) 01/04/2020 0449   NA 137 01/02/2020 0535   K 4.0 01/06/2020 0512   K 3.8 01/04/2020 0449   K 4.2 01/02/2020 0535   CL 94 (L) 01/06/2020 0512   CL 95 (L) 01/04/2020 0449   CL 96 (L) 01/02/2020 0535   CO2 23 01/06/2020 0512   CO2 24 01/04/2020 0449   CO2 25 01/02/2020 0535   GLUCOSE 98 01/06/2020 0512   GLUCOSE 114 (H) 01/04/2020 0449   GLUCOSE 94 01/02/2020 0535   BUN 97 (H) 01/06/2020 0512   BUN 98 (H) 01/04/2020 0449   BUN 102 (H) 01/02/2020 0535   CREATININE 5.73 (H) 01/06/2020 0512   CREATININE 6.03 (H) 01/04/2020 0449   CREATININE 6.63 (H) 01/02/2020 0535   CALCIUM 9.6 01/06/2020 0512   CALCIUM 9.5 01/04/2020 0449   CALCIUM 9.5 01/02/2020 0535   CALCIUM 8.6 (L) 12/23/2019 0323   CALCIUM 8.2 (L) 10/21/2010 1420   CALCIUM 8.7 06/24/2009 0542   GFRNONAA 11 (L) 01/06/2020 0512   GFRNONAA 10 (L) 01/04/2020 0449   GFRNONAA 9 (L) 01/02/2020 0535   GFRAA 12 (L) 01/06/2020 0512   GFRAA 11  (L) 01/04/2020 0449   GFRAA 10 (L) 01/02/2020 0535   CBC    Component Value Date/Time   WBC 7.0 01/06/2020 0512   RBC 3.22 (L) 01/06/2020 0512   HGB 8.9 (L) 01/06/2020 0512   HGB 12.3 (L) 12/31/2017 1012   HCT 29.0 (L) 01/06/2020 0512   HCT 37.4 (L) 12/31/2017 1012   PLT 274 01/06/2020 0512   PLT 176 12/31/2017 1012   MCV 90.1 01/06/2020 0512   MCV 99 (H) 12/31/2017 1012   MCH 27.6 01/06/2020 0512   MCHC 30.7 01/06/2020 0512   RDW 16.5 (H) 01/06/2020 0512   RDW 18.8 (H) 12/31/2017 1012   LYMPHSABS 1.1 12/17/2019 1504   LYMPHSABS 1.0 12/31/2017 1012   MONOABS 1.7 (H) 12/17/2019 1504   EOSABS 0.5 12/17/2019 1504   EOSABS 0.1 12/31/2017 1012   BASOSABS 0.1 12/17/2019 1504   BASOSABS 0.0 12/31/2017 1012   HEPATIC Function Panel Recent Labs    12/17/19 0017 12/19/19 0446 12/24/19 0539  PROT 8.1 7.4 7.6   HEMOGLOBIN A1C No components found for: HGA1C,  MPG CARDIAC ENZYMES Lab Results  Component Value Date   CKTOTAL 520 (H) 07/16/2012   CKMB 10.7 (HH) 07/16/2012   TROPONINI 0.22 (HH) 11/13/2017   TROPONINI 0.19 (HH) 11/12/2017   TROPONINI 0.26 (HH) 05/19/2017   BNP No  results for input(s): PROBNP in the last 8760 hours. TSH Recent Labs    11/16/19 1730 12/25/19 0350  TSH 7.112* 6.933*   CHOLESTEROL No results for input(s): CHOL in the last 8760 hours.  Scheduled Meds: Continuous Infusions: PRN Meds:.  Assessment/Plan: Moderate pericardial effusion, stable Acute on chronic respiratory failure with hypoxemia Moderate LV systolic dysfunction ESRD Chronic atrial fibrillation H/O PE Bilateral feet gangrene S/P cardiac arrest x 2 NSVT Anemia of chronic disease  Monitor pericardial effusion. If unchanged after 4-6 weeks consider pericardiocentesis.   LOS: 0 days   Time spent including chart review, lab review, examination, discussion with patient and nurse : 30 min   Dixie Dials  MD  01/06/2020, 12:26 PM

## 2020-01-07 DIAGNOSIS — J9690 Respiratory failure, unspecified, unspecified whether with hypoxia or hypercapnia: Secondary | ICD-10-CM | POA: Diagnosis not present

## 2020-01-07 DIAGNOSIS — N186 End stage renal disease: Secondary | ICD-10-CM | POA: Diagnosis not present

## 2020-01-07 DIAGNOSIS — Z992 Dependence on renal dialysis: Secondary | ICD-10-CM | POA: Diagnosis not present

## 2020-01-07 DIAGNOSIS — Z9911 Dependence on respirator [ventilator] status: Secondary | ICD-10-CM | POA: Diagnosis not present

## 2020-01-07 DIAGNOSIS — I2699 Other pulmonary embolism without acute cor pulmonale: Secondary | ICD-10-CM | POA: Diagnosis not present

## 2020-01-07 DIAGNOSIS — I482 Chronic atrial fibrillation, unspecified: Secondary | ICD-10-CM | POA: Diagnosis not present

## 2020-01-07 DIAGNOSIS — I5043 Acute on chronic combined systolic (congestive) and diastolic (congestive) heart failure: Secondary | ICD-10-CM | POA: Diagnosis not present

## 2020-01-07 DIAGNOSIS — J9621 Acute and chronic respiratory failure with hypoxia: Secondary | ICD-10-CM | POA: Diagnosis not present

## 2020-01-07 DIAGNOSIS — I5040 Unspecified combined systolic (congestive) and diastolic (congestive) heart failure: Secondary | ICD-10-CM | POA: Diagnosis not present

## 2020-01-07 NOTE — Progress Notes (Signed)
Pulmonary [place]   PULMONARY CRITICAL CARE SERVICE  PROGRESS NOTE  Date of Service: 01/07/2020  [name] [name]  [id number]  DOB: August 26, 1966   DOA: 12/24/2019  Referring Physician: Merton Border, MD  HPI: Leovardo [name] is a 54 y.o. male seen for follow up of Acute on Chronic Respiratory Failure.  Patient is currently on full support on assist control mode has been consistently failing attempts at weaning.  Remains agitated confused at times  Medications: Reviewed on Rounds  Physical Exam:  Vitals: Temperature 95.3 pulse 91 respiratory 20 blood pressure is 136/65 saturations 100%  Ventilator Settings mode ventilation assist control FiO2 28% tidal volume 606 PEEP 5  . General: Comfortable at this time . Eyes: Grossly normal lids, irises & conjunctiva . ENT: grossly tongue is normal . Neck: no obvious mass . Cardiovascular: S1 S2 normal no gallop . Respiratory: No rhonchi coarse breath sounds are noted . Abdomen: soft . Skin: no rash seen on limited exam . Musculoskeletal: not rigid . Psychiatric:unable to assess . Neurologic: no seizure no involuntary movements         Lab Data:   Basic Metabolic Panel: Recent Labs  Lab 01/02/20 0535 01/04/20 0449 01/06/20 0512  NA 137 134* 133*  K 4.2 3.8 4.0  CL 96* 95* 94*  CO2 25 24 23   GLUCOSE 94 114* 98  BUN 102* 98* 97*  CREATININE 6.63* 6.03* 5.73*  CALCIUM 9.5 9.5 9.6  PHOS 3.4 2.8 2.0*    ABG: No results for input(s): PHART, PCO2ART, PO2ART, HCO3, O2SAT in the last 168 hours.  Liver Function Tests: Recent Labs  Lab 01/02/20 0535 01/04/20 0449 01/06/20 0512  ALBUMIN 2.7* 3.0* 3.0*   No results for input(s): LIPASE, AMYLASE in the last 168 hours. No results for input(s): AMMONIA in the last 168 hours.  CBC: Recent Labs  Lab 01/02/20 0535 01/04/20 0449 01/06/20 0512  WBC 7.8 7.1 7.0  HGB 8.2* 9.2* 8.9*  HCT 27.1* 30.2* 29.0*  MCV 91.2 91.5 90.1  PLT 283  278 274    Cardiac Enzymes: No results for input(s): CKTOTAL, CKMB, CKMBINDEX, TROPONINI in the last 168 hours.  BNP (last 3 results) Recent Labs    11/16/19 1356  BNP 962.8*    ProBNP (last 3 results) No results for input(s): PROBNP in the last 8760 hours.  Radiological Exams: No results found.  Assessment/Plan Active Problems:   Acute on chronic respiratory failure with hypoxia (HCC)   End stage renal disease on dialysis (HCC)   Acute on chronic systolic and diastolic heart failure, NYHA class 4 (HCC)   Chronic atrial fibrillation (HCC)   Acute pulmonary embolism without acute cor pulmonale ([place])   1. Acute on chronic respiratory failure hypoxia plan is to continue on full support on assist control mode continue pulmonary toilet supportive care 2. End-stage renal disease on hemodialysis 3. Acute on chronic systolic heart failure compensated 4. Chronic atrial fibrillation rate controlled 5. Acute pulmonary embolism treated   I have personally seen and evaluated the patient, evaluated laboratory and imaging results, formulated the assessment and plan and placed orders. The Patient requires high complexity decision making with multiple systems involvement.  Rounds were done with the Respiratory Therapy Director and Staff therapists and discussed with nursing staff also.  Allyne Gee, MD [facility] Pulmonary Critical Care Medicine Sleep Medicine

## 2020-01-08 DIAGNOSIS — Z992 Dependence on renal dialysis: Secondary | ICD-10-CM | POA: Diagnosis not present

## 2020-01-08 DIAGNOSIS — I5040 Unspecified combined systolic (congestive) and diastolic (congestive) heart failure: Secondary | ICD-10-CM | POA: Diagnosis not present

## 2020-01-08 DIAGNOSIS — Z9911 Dependence on respirator [ventilator] status: Secondary | ICD-10-CM | POA: Diagnosis not present

## 2020-01-08 DIAGNOSIS — I5043 Acute on chronic combined systolic (congestive) and diastolic (congestive) heart failure: Secondary | ICD-10-CM | POA: Diagnosis not present

## 2020-01-08 DIAGNOSIS — I482 Chronic atrial fibrillation, unspecified: Secondary | ICD-10-CM | POA: Diagnosis not present

## 2020-01-08 DIAGNOSIS — N186 End stage renal disease: Secondary | ICD-10-CM | POA: Diagnosis not present

## 2020-01-08 DIAGNOSIS — I2699 Other pulmonary embolism without acute cor pulmonale: Secondary | ICD-10-CM | POA: Diagnosis not present

## 2020-01-08 DIAGNOSIS — J9621 Acute and chronic respiratory failure with hypoxia: Secondary | ICD-10-CM | POA: Diagnosis not present

## 2020-01-08 DIAGNOSIS — J9690 Respiratory failure, unspecified, unspecified whether with hypoxia or hypercapnia: Secondary | ICD-10-CM | POA: Diagnosis not present

## 2020-01-08 NOTE — Progress Notes (Addendum)
Pulmonary [place]   PULMONARY CRITICAL CARE SERVICE  PROGRESS NOTE  Date of Service: 01/08/2020  [name] [name]  [id number]  DOB: May 11, 1966   DOA: 12/24/2019  Referring Physician: Merton Border, MD  HPI: [name] [name] is a 54 y.o. male seen for follow up of Acute on Chronic Respiratory Failure.  Patient medical support ventilator assist control mode rate of 20 with FiO2 28%.  Satting well no fever distress.  Medications: Reviewed on Rounds  Physical Exam:  Vitals: Pulse 95 respirations 19 BP 102/75 O2 sat 100% temp 94.6  Ventilator Settings ventilator mode AC VC rate of 20 tidal line 590 PEEP of 5 FiO2 28%  . General: Comfortable at this time . Eyes: Grossly normal lids, irises & conjunctiva . ENT: grossly tongue is normal . Neck: no obvious mass . Cardiovascular: S1 S2 normal no gallop . Respiratory: No rales or rhonchi noted . Abdomen: soft . Skin: no rash seen on limited exam . Musculoskeletal: not rigid . Psychiatric:unable to assess . Neurologic: no seizure no involuntary movements         Lab Data:   Basic Metabolic Panel: Recent Labs  Lab 01/02/20 0535 01/04/20 0449 01/06/20 0512  NA 137 134* 133*  K 4.2 3.8 4.0  CL 96* 95* 94*  CO2 25 24 23   GLUCOSE 94 114* 98  BUN 102* 98* 97*  CREATININE 6.63* 6.03* 5.73*  CALCIUM 9.5 9.5 9.6  PHOS 3.4 2.8 2.0*    ABG: No results for input(s): PHART, PCO2ART, PO2ART, HCO3, O2SAT in the last 168 hours.  Liver Function Tests: Recent Labs  Lab 01/02/20 0535 01/04/20 0449 01/06/20 0512  ALBUMIN 2.7* 3.0* 3.0*   No results for input(s): LIPASE, AMYLASE in the last 168 hours. No results for input(s): AMMONIA in the last 168 hours.  CBC: Recent Labs  Lab 01/02/20 0535 01/04/20 0449 01/06/20 0512  WBC 7.8 7.1 7.0  HGB 8.2* 9.2* 8.9*  HCT 27.1* 30.2* 29.0*  MCV 91.2 91.5 90.1  PLT 283 278 274    Cardiac Enzymes: No results for input(s): CKTOTAL, CKMB,  CKMBINDEX, TROPONINI in the last 168 hours.  BNP (last 3 results) Recent Labs    11/16/19 1356  BNP 962.8*    ProBNP (last 3 results) No results for input(s): PROBNP in the last 8760 hours.  Radiological Exams: No results found.  Assessment/Plan Active Problems:   Acute on chronic respiratory failure with hypoxia (HCC)   End stage renal disease on dialysis (HCC)   Acute on chronic systolic and diastolic heart failure, NYHA class 4 (HCC)   Chronic atrial fibrillation (HCC)   Acute pulmonary embolism without acute cor pulmonale ([place])   1. Acute on chronic respiratory failure hypoxia plan is to continue on full support on assist control mode continue pulmonary toilet supportive care 2. End-stage renal disease on hemodialysis 3. Acute on chronic systolic heart failure compensated 4. Chronic atrial fibrillation rate controlled 5. Acute pulmonary embolism treated   I have personally seen and evaluated the patient, evaluated laboratory and imaging results, formulated the assessment and plan and placed orders. The Patient requires high complexity decision making with multiple systems involvement.  Rounds were done with the Respiratory Therapy Director and Staff therapists and discussed with nursing staff also.  Allyne Gee, MD [facility] Pulmonary Critical Care Medicine Sleep Medicine

## 2020-01-09 ENCOUNTER — Other Ambulatory Visit (HOSPITAL_COMMUNITY): Payer: Medicare Other

## 2020-01-09 DIAGNOSIS — Z9911 Dependence on respirator [ventilator] status: Secondary | ICD-10-CM | POA: Diagnosis not present

## 2020-01-09 DIAGNOSIS — Z992 Dependence on renal dialysis: Secondary | ICD-10-CM | POA: Diagnosis not present

## 2020-01-09 DIAGNOSIS — I5040 Unspecified combined systolic (congestive) and diastolic (congestive) heart failure: Secondary | ICD-10-CM | POA: Diagnosis not present

## 2020-01-09 DIAGNOSIS — N186 End stage renal disease: Secondary | ICD-10-CM | POA: Diagnosis not present

## 2020-01-09 DIAGNOSIS — D631 Anemia in chronic kidney disease: Secondary | ICD-10-CM | POA: Diagnosis not present

## 2020-01-09 DIAGNOSIS — I482 Chronic atrial fibrillation, unspecified: Secondary | ICD-10-CM | POA: Diagnosis not present

## 2020-01-09 DIAGNOSIS — J9621 Acute and chronic respiratory failure with hypoxia: Secondary | ICD-10-CM | POA: Diagnosis not present

## 2020-01-09 DIAGNOSIS — E871 Hypo-osmolality and hyponatremia: Secondary | ICD-10-CM | POA: Diagnosis not present

## 2020-01-09 DIAGNOSIS — J9690 Respiratory failure, unspecified, unspecified whether with hypoxia or hypercapnia: Secondary | ICD-10-CM | POA: Diagnosis not present

## 2020-01-09 DIAGNOSIS — I2699 Other pulmonary embolism without acute cor pulmonale: Secondary | ICD-10-CM | POA: Diagnosis not present

## 2020-01-09 DIAGNOSIS — J9600 Acute respiratory failure, unspecified whether with hypoxia or hypercapnia: Secondary | ICD-10-CM | POA: Diagnosis not present

## 2020-01-09 DIAGNOSIS — I5043 Acute on chronic combined systolic (congestive) and diastolic (congestive) heart failure: Secondary | ICD-10-CM | POA: Diagnosis not present

## 2020-01-09 DIAGNOSIS — N2581 Secondary hyperparathyroidism of renal origin: Secondary | ICD-10-CM | POA: Diagnosis not present

## 2020-01-09 HISTORY — PX: IR GASTROSTOMY TUBE MOD SED: IMG625

## 2020-01-09 LAB — CBC
HCT: 31.4 % — ABNORMAL LOW (ref 39.0–52.0)
Hemoglobin: 9.7 g/dL — ABNORMAL LOW (ref 13.0–17.0)
MCH: 27.6 pg (ref 26.0–34.0)
MCHC: 30.9 g/dL (ref 30.0–36.0)
MCV: 89.5 fL (ref 80.0–100.0)
Platelets: 263 10*3/uL (ref 150–400)
RBC: 3.51 MIL/uL — ABNORMAL LOW (ref 4.22–5.81)
RDW: 16.7 % — ABNORMAL HIGH (ref 11.5–15.5)
WBC: 6.1 10*3/uL (ref 4.0–10.5)
nRBC: 0 % (ref 0.0–0.2)

## 2020-01-09 LAB — RENAL FUNCTION PANEL
Albumin: 3 g/dL — ABNORMAL LOW (ref 3.5–5.0)
Anion gap: 18 — ABNORMAL HIGH (ref 5–15)
BUN: 101 mg/dL — ABNORMAL HIGH (ref 6–20)
CO2: 24 mmol/L (ref 22–32)
Calcium: 9.3 mg/dL (ref 8.9–10.3)
Chloride: 91 mmol/L — ABNORMAL LOW (ref 98–111)
Creatinine, Ser: 6.97 mg/dL — ABNORMAL HIGH (ref 0.61–1.24)
GFR calc Af Amer: 10 mL/min — ABNORMAL LOW (ref >60)
GFR calc non Af Amer: 8 mL/min — ABNORMAL LOW (ref >60)
Glucose, Bld: 88 mg/dL (ref 70–99)
Phosphorus: 3 mg/dL (ref 2.5–4.6)
Potassium: 3.8 mmol/L (ref 3.5–5.1)
Sodium: 133 mmol/L — ABNORMAL LOW (ref 135–145)

## 2020-01-09 MED ORDER — FENTANYL CITRATE (PF) 100 MCG/2ML IJ SOLN
INTRAMUSCULAR | Status: AC | PRN
  Administered 2020-01-09: 25 ug via INTRAVENOUS
  Administered 2020-01-09: 50 ug via INTRAVENOUS
  Administered 2020-01-09: 25 ug via INTRAVENOUS

## 2020-01-09 MED ORDER — GLUCAGON HCL RDNA (DIAGNOSTIC) 1 MG IJ SOLR
INTRAMUSCULAR | Status: AC | PRN
  Administered 2020-01-09: .5 mg via INTRAVENOUS

## 2020-01-09 MED ORDER — FENTANYL CITRATE (PF) 100 MCG/2ML IJ SOLN
INTRAMUSCULAR | Status: AC
  Filled 2020-01-09: qty 2

## 2020-01-09 MED ORDER — MIDAZOLAM HCL 2 MG/2ML IJ SOLN
INTRAMUSCULAR | Status: AC | PRN
  Administered 2020-01-09: 0.5 mg via INTRAVENOUS
  Administered 2020-01-09: 1 mg via INTRAVENOUS

## 2020-01-09 MED ORDER — LIDOCAINE HCL 1 % IJ SOLN
INTRAMUSCULAR | Status: AC
  Filled 2020-01-09: qty 20

## 2020-01-09 MED ORDER — SODIUM CHLORIDE 0.9 % IV SOLN
INTRAVENOUS | Status: AC | PRN
  Administered 2020-01-09: 10 mL/h via INTRAVENOUS

## 2020-01-09 MED ORDER — GLUCAGON HCL RDNA (DIAGNOSTIC) 1 MG IJ SOLR
INTRAMUSCULAR | Status: AC
  Filled 2020-01-09: qty 1

## 2020-01-09 MED ORDER — IOHEXOL 300 MG/ML  SOLN
50.0000 mL | Freq: Once | INTRAMUSCULAR | Status: AC | PRN
  Administered 2020-01-09: 15 mL

## 2020-01-09 MED ORDER — MIDAZOLAM HCL 2 MG/2ML IJ SOLN
INTRAMUSCULAR | Status: AC
  Filled 2020-01-09: qty 2

## 2020-01-09 MED ORDER — LIDOCAINE HCL (PF) 1 % IJ SOLN
INTRAMUSCULAR | Status: AC | PRN
  Administered 2020-01-09: 10 mL

## 2020-01-09 MED ORDER — LIDOCAINE VISCOUS HCL 2 % MT SOLN
OROMUCOSAL | Status: AC
  Filled 2020-01-09: qty 15

## 2020-01-09 NOTE — Progress Notes (Signed)
[facility] [place]   PULMONARY CRITICAL CARE SERVICE  PROGRESS NOTE  Date of Service: 01/09/2020  [name] [name]  DJT:[id number]  DOB: Mar 13, 1966   DOA: 12/24/2019  Referring Physician: Merton Border, MD  HPI: [name] [name] is a 54 y.o. male seen for follow up of Acute on Chronic Respiratory Failure.  Patient is currently on full support on assist control mode has been on 28% FiO2 went down today for a PEG placement which was done without any issues now is back on the ventilator  Medications: Reviewed on Rounds  Physical Exam:  Vitals: Temperature 96.2 pulse 94 respiratory 20 blood pressure is 104/71 saturations 100%  Ventilator Settings mode of ventilation assist control with FiO2 28% tidal volume 665 PEEP 5  . General: Comfortable at this time . Eyes: Grossly normal lids, irises & conjunctiva . ENT: grossly tongue is normal . Neck: no obvious mass . Cardiovascular: S1 S2 normal no gallop . Respiratory: No rhonchi coarse breath sounds . Abdomen: soft . Skin: no rash seen on limited exam . Musculoskeletal: not rigid . Psychiatric:unable to assess . Neurologic: no seizure no involuntary movements         Lab Data:   Basic Metabolic Panel: Recent Labs  Lab 01/04/20 0449 01/06/20 0512 01/09/20 0536  NA 134* 133* 133*  K 3.8 4.0 3.8  CL 95* 94* 91*  CO2 24 23 24   GLUCOSE 114* 98 88  BUN 98* 97* 101*  CREATININE 6.03* 5.73* 6.97*  CALCIUM 9.5 9.6 9.3  PHOS 2.8 2.0* 3.0    ABG: No results for input(s): PHART, PCO2ART, PO2ART, HCO3, O2SAT in the last 168 hours.  Liver Function Tests: Recent Labs  Lab 01/04/20 0449 01/06/20 0512 01/09/20 0536  ALBUMIN 3.0* 3.0* 3.0*   No results for input(s): LIPASE, AMYLASE in the last 168 hours. No results for input(s): AMMONIA in the last 168 hours.  CBC: Recent Labs  Lab 01/04/20 0449 01/06/20 0512 01/09/20 0536  WBC 7.1 7.0 6.1  HGB 9.2* 8.9* 9.7*  HCT 30.2* 29.0*  31.4*  MCV 91.5 90.1 89.5  PLT 278 274 263    Cardiac Enzymes: No results for input(s): CKTOTAL, CKMB, CKMBINDEX, TROPONINI in the last 168 hours.  BNP (last 3 results) Recent Labs    11/16/19 1356  BNP 962.8*    ProBNP (last 3 results) No results for input(s): PROBNP in the last 8760 hours.  Radiological Exams: IR GASTROSTOMY TUBE MOD SED  Result Date: 01/09/2020 INDICATION: Acute respiratory failure, ventilated, end-stage renal disease, dysphagia none nutrition EXAM: FLUOROSCOPIC 20 FRENCH PULL-THROUGH GASTROSTOMY Date:  01/09/2020 01/09/2020 1:21 pm Radiologist:  Jerilynn Mages. Daryll Brod, MD Guidance:  Ultrasound fluoroscopic MEDICATIONS: Ancef 2 g within 1 hour of the procedure; Antibiotics were administered within 1 hour of the procedure. Glucagon 0.5 mg IV ANESTHESIA/SEDATION: Versed 1.5 mg IV; Fentanyl 100 mcg IV Moderate Sedation Time:  10 minutes The patient was continuously monitored during the procedure by the interventional radiology nurse under my direct supervision. CONTRAST:  19mL OMNIPAQUE IOHEXOL 300 MG/ML SOLN - administered into the gastric lumen. FLUOROSCOPY TIME:  Fluoroscopy Time: 2 minutes 24 seconds (94 mGy). COMPLICATIONS: None immediate. PROCEDURE: Informed consent was obtained from the patient following explanation of the procedure, risks, benefits and alternatives. The patient understands, agrees and consents for the procedure. All questions were addressed. A time out was performed. Maximal barrier sterile technique utilized including caps, mask, sterile gowns, sterile gloves, large sterile drape, hand hygiene, and betadine prep. The  left upper quadrant was sterilely prepped and draped. An oral gastric catheter was inserted into the stomach under fluoroscopy. The existing nasogastric feeding tube was removed. Air was injected into the stomach for insufflation and visualization under fluoroscopy. The air distended stomach was confirmed beneath the anterior abdominal wall in the  frontal and lateral projections. Under sterile conditions and local anesthesia, a 54 gauge trocar needle was utilized to access the stomach percutaneously beneath the left subcostal margin. Needle position was confirmed within the stomach under biplane fluoroscopy. Contrast injection confirmed position also. A single T tack was deployed for gastropexy. Over an Amplatz guide wire, a 9-French sheath was inserted into the stomach. A snare device was utilized to capture the oral gastric catheter. The snare device was pulled retrograde from the stomach up the esophagus and out the oropharynx. The 20-French pull-through gastrostomy was connected to the snare device and pulled antegrade through the oropharynx down the esophagus into the stomach and then through the percutaneous tract external to the patient. The gastrostomy was assembled externally. Contrast injection confirms position in the stomach. Images were obtained for documentation. The patient tolerated procedure well. No immediate complication. IMPRESSION: Fluoroscopic insertion of a 20-French "pull-through" gastrostomy. Electronically Signed   By: Jerilynn Mages.  Shick M.D.   On: 01/09/2020 13:33    Assessment/Plan Active Problems:   Acute on chronic respiratory failure with hypoxia (HCC)   End stage renal disease on dialysis (HCC)   Acute on chronic systolic and diastolic heart failure, NYHA class 4 (HCC)   Chronic atrial fibrillation ([place])   Acute pulmonary embolism without acute cor pulmonale ([place])   1. Acute on chronic respiratory failure hypoxia plan is to continue with full support on assist control mode currently on 28% FiO2 good volumes are noted.  Respiratory therapy will reassess the RSB I and the mechanics and try to wean the patient again. 2. End-stage renal disease on dialysis we will continue with supportive care nephrology is following along 3. Acute on chronic systolic and diastolic heart failure compensated we will continue with supportive  care 4. Chronic atrial fibrillation rate controlled we will continue to follow 5. Acute pulmonary embolism treated we will monitor closely   I have personally seen and evaluated the patient, evaluated laboratory and imaging results, formulated the assessment and plan and placed orders. The Patient requires high complexity decision making with multiple systems involvement.  Rounds were done with the Respiratory Therapy Director and Staff therapists and discussed with nursing staff also.  Allyne Gee, MD [facility] Pulmonary Critical Care Medicine Sleep Medicine

## 2020-01-09 NOTE — Progress Notes (Signed)
[facility]  ROUNDING NOTE   Subjective:  Patient seen and evaluated during hemodialysis. Currently off norepinephrine.     Objective:  Vital signs in last 24 hours:  Temperature 96.2 pulse 94 respirations 20 blood pressure 104/71  Physical Exam: General: Chronically ill-appearing  Head: Normocephalic, atraumatic. Moist oral mucosal membranes  Eyes: Anicteric  Neck: Tracheostomy present  Lungs:  Scattered rhonchi  Heart: S1S2 no rubs  Abdomen:  Soft, nontender, bowel sounds present, distended  Extremities: Trace peripheral edema.  Neurologic: Awake, alert, following commands  Skin: Right and left toes covered  Access: Right IJ temporary dialysis catheter    Basic Metabolic Panel: Recent Labs  Lab 01/04/20 0449 01/06/20 0512 01/09/20 0536  NA 134* 133* 133*  K 3.8 4.0 3.8  CL 95* 94* 91*  CO2 24 23 24   GLUCOSE 114* 98 88  BUN 98* 97* 101*  CREATININE 6.03* 5.73* 6.97*  CALCIUM 9.5 9.6 9.3  PHOS 2.8 2.0* 3.0    Liver Function Tests: Recent Labs  Lab 01/04/20 0449 01/06/20 0512 01/09/20 0536  ALBUMIN 3.0* 3.0* 3.0*   No results for input(s): LIPASE, AMYLASE in the last 168 hours. No results for input(s): AMMONIA in the last 168 hours.  CBC: Recent Labs  Lab 01/04/20 0449 01/06/20 0512 01/09/20 0536  WBC 7.1 7.0 6.1  HGB 9.2* 8.9* 9.7*  HCT 30.2* 29.0* 31.4*  MCV 91.5 90.1 89.5  PLT 278 274 263    Cardiac Enzymes: No results for input(s): CKTOTAL, CKMB, CKMBINDEX, TROPONINI in the last 168 hours.  BNP: Invalid input(s): POCBNP  CBG: No results for input(s): GLUCAP in the last 168 hours.  Microbiology: Results for orders placed or performed during the hospital encounter of 11/16/19  SARS Coronavirus 2 by RT PCR (hospital order, performed in [facility] hospital lab) Nasopharyngeal Nasopharyngeal Swab     Status: None   Collection Time: 11/16/19 11:41 AM   Specimen: Nasopharyngeal Swab  Result Value Ref Range Status   SARS  Coronavirus 2 NEGATIVE NEGATIVE Final    Comment: (NOTE) SARS-CoV-2 target nucleic acids are NOT DETECTED. The SARS-CoV-2 RNA is generally detectable in upper and lower respiratory specimens during the acute phase of infection. The lowest concentration of SARS-CoV-2 viral copies this assay can detect is 250 copies / mL. A negative result does not preclude SARS-CoV-2 infection and should not be used as the sole basis for treatment or other patient management decisions.  A negative result may occur with improper specimen collection / handling, submission of specimen other than nasopharyngeal swab, presence of viral mutation(s) within the areas targeted by this assay, and inadequate number of viral copies (<250 copies / mL). A negative result must be combined with clinical observations, patient history, and epidemiological information. Fact Sheet for Patients:   StrictlyIdeas.no Fact Sheet for Healthcare Providers: BankingDealers.co.za This test is not yet approved or cleared  by the [place] FDA and has been authorized for detection and/or diagnosis of SARS-CoV-2 by FDA under an Emergency Use Authorization (EUA).  This EUA will remain in effect (meaning this test can be used) for the duration of the COVID-19 declaration under Section 564(b)(1) of the Act, 21 U.S.C. section 360bbb-3(b)(1), unless the authorization is terminated or revoked sooner. Performed at [facility], [place] [address] [place], [place] [address]   Culture, blood (routine x 2)     Status: None   Collection Time: 11/16/19  6:03 PM   Specimen: BLOOD RIGHT HAND  Result Value Ref Range  Status   Specimen Description BLOOD RIGHT HAND  Final   Special Requests   Final    BOTTLES DRAWN AEROBIC AND ANAEROBIC Blood Culture results may not be optimal due to an inadequate volume of blood received in culture bottles   Culture   Final    NO GROWTH 5 DAYS Performed at  [facility], [place] [address], [place], [place] [address]    Report Status 11/21/2019 FINAL  Final  MRSA PCR Screening     Status: None   Collection Time: 11/16/19  6:05 PM   Specimen: Nasal Mucosa; Nasopharyngeal  Result Value Ref Range Status   MRSA by PCR NEGATIVE NEGATIVE Final    Comment:        The GeneXpert MRSA Assay (FDA approved for NASAL specimens only), is one component of a comprehensive MRSA colonization surveillance program. It is not intended to diagnose MRSA infection nor to guide or monitor treatment for MRSA infections. Performed at [facility], [place] [address] [place], [place] [address]   Culture, blood (routine x 2)     Status: None   Collection Time: 11/16/19  6:08 PM   Specimen: BLOOD RIGHT HAND  Result Value Ref Range Status   Specimen Description BLOOD RIGHT HAND  Final   Special Requests   Final    BOTTLES DRAWN AEROBIC ONLY Blood Culture results may not be optimal due to an inadequate volume of blood received in culture bottles   Culture   Final    NO GROWTH 5 DAYS Performed at [facility], [place] [address], [place], [place] [address]    Report Status 11/21/2019 FINAL  Final  Culture, respiratory (non-expectorated)     Status: None   Collection Time: 11/18/19 12:28 PM   Specimen: Tracheal Aspirate; Respiratory  Result Value Ref Range Status   Specimen Description TRACHEAL ASPIRATE  Final   Special Requests NONE  Final   Gram Stain   Final    ABUNDANT WBC PRESENT,BOTH PMN AND MONONUCLEAR ABUNDANT GRAM POSITIVE COCCI ABUNDANT GRAM NEGATIVE RODS FEW GRAM VARIABLE ROD    Culture   Final    ABUNDANT HAEMOPHILUS INFLUENZAE BETA LACTAMASE POSITIVE Performed at [facility], [place] [address] [place], [place] [address]    Report Status 11/20/2019 FINAL  Final  Surgical pcr screen     Status: Abnormal   Collection Time: 11/24/19  7:53 AM   Specimen: Nasal Mucosa; Nasal Swab  Result Value Ref Range Status   MRSA, PCR NEGATIVE  NEGATIVE Final   Staphylococcus aureus POSITIVE (A) NEGATIVE Final    Comment: (NOTE) The Xpert SA Assay (FDA approved for NASAL specimens in patients 42 years of age and older), is one component of a comprehensive surveillance program. It is not intended to diagnose infection nor to guide or monitor treatment. Performed at [facility], [place] [address], [place], [place] [address]   Culture, blood (routine x 2)     Status: None   Collection Time: 11/25/19  3:30 PM   Specimen: BLOOD  Result Value Ref Range Status   Specimen Description BLOOD RIGHT ANTECUBITAL  Final   Special Requests   Final    BOTTLES DRAWN AEROBIC ONLY Blood Culture results may not be optimal due to an inadequate volume of blood received in culture bottles   Culture   Final    NO GROWTH 5 DAYS Performed at [facility], [place] [address], [place], [place] [address]  Report Status 11/30/2019 FINAL  Final  Culture, blood (routine x 2)     Status: None   Collection Time: 11/25/19  3:30 PM   Specimen: BLOOD RIGHT HAND  Result Value Ref Range Status   Specimen Description BLOOD RIGHT HAND  Final   Special Requests   Final    BOTTLES DRAWN AEROBIC AND ANAEROBIC Blood Culture results may not be optimal due to an inadequate volume of blood received in culture bottles   Culture   Final    NO GROWTH 5 DAYS Performed at [facility], [place] [address], [place], [place] [address]    Report Status 11/30/2019 FINAL  Final  Culture, respiratory (non-expectorated)     Status: None   Collection Time: 11/25/19  4:35 PM   Specimen: Tracheal Aspirate; Respiratory  Result Value Ref Range Status   Specimen Description TRACHEAL ASPIRATE  Final   Special Requests NONE  Final   Gram Stain   Final    ABUNDANT WBC PRESENT,BOTH PMN AND MONONUCLEAR FEW GRAM POSITIVE COCCI Performed at [facility], [place] [address] [place], [place] [address]    Culture FEW STAPHYLOCOCCUS EPIDERMIDIS  Final   Report Status  11/28/2019 FINAL  Final   Organism ID, Bacteria STAPHYLOCOCCUS EPIDERMIDIS  Final      Susceptibility   Staphylococcus epidermidis - MIC*    CIPROFLOXACIN <=0.5 SENSITIVE Sensitive     ERYTHROMYCIN >=8 RESISTANT Resistant     GENTAMICIN <=0.5 SENSITIVE Sensitive     OXACILLIN >=4 RESISTANT Resistant     TETRACYCLINE <=1 SENSITIVE Sensitive     VANCOMYCIN 2 SENSITIVE Sensitive     TRIMETH/SULFA <=10 SENSITIVE Sensitive     CLINDAMYCIN >=8 RESISTANT Resistant     RIFAMPIN <=0.5 SENSITIVE Sensitive     Inducible Clindamycin NEGATIVE Sensitive     * FEW STAPHYLOCOCCUS EPIDERMIDIS  Culture, respiratory (non-expectorated)     Status: None   Collection Time: 12/15/19  4:12 PM   Specimen: Tracheal Aspirate; Respiratory  Result Value Ref Range Status   Specimen Description TRACHEAL ASPIRATE  Final   Special Requests NONE  Final   Gram Stain   Final    MODERATE WBC PRESENT, PREDOMINANTLY PMN MODERATE GRAM POSITIVE COCCI IN PAIRS IN CLUSTERS Performed at [facility], [address] [place], [place] [address]    Culture MODERATE STAPHYLOCOCCUS AUREUS  Final   Report Status 12/17/2019 FINAL  Final   Organism ID, Bacteria STAPHYLOCOCCUS AUREUS  Final      Susceptibility   Staphylococcus aureus - MIC*    CIPROFLOXACIN <=0.5 SENSITIVE Sensitive     ERYTHROMYCIN <=0.25 SENSITIVE Sensitive     GENTAMICIN <=0.5 SENSITIVE Sensitive     OXACILLIN <=0.25 SENSITIVE Sensitive     TETRACYCLINE <=1 SENSITIVE Sensitive     VANCOMYCIN 1 SENSITIVE Sensitive     TRIMETH/SULFA <=10 SENSITIVE Sensitive     CLINDAMYCIN <=0.25 SENSITIVE Sensitive     RIFAMPIN <=0.5 SENSITIVE Sensitive     Inducible Clindamycin NEGATIVE Sensitive     * MODERATE STAPHYLOCOCCUS AUREUS  MRSA PCR Screening     Status: None   Collection Time: 12/17/19 10:25 AM   Specimen: Nasopharyngeal  Result Value Ref Range Status   MRSA by PCR NEGATIVE NEGATIVE Final    Comment:        The GeneXpert MRSA Assay (FDA approved for  NASAL specimens only), is one component of a comprehensive MRSA colonization surveillance program. It is not intended to diagnose MRSA infection  nor to guide or monitor treatment for MRSA infections. Performed at [facility], [place] [address], [place], [place] [address]   Culture, blood (routine x 2)     Status: None   Collection Time: 12/17/19  3:15 PM   Specimen: BLOOD  Result Value Ref Range Status   Specimen Description BLOOD RIGHT ANTECUBITAL  Final   Special Requests   Final    BOTTLES DRAWN AEROBIC AND ANAEROBIC Blood Culture results may not be optimal due to an inadequate volume of blood received in culture bottles   Culture   Final    NO GROWTH 5 DAYS Performed at [facility], [place] [address], [place], [place] [address]    Report Status 12/22/2019 FINAL  Final  Culture, blood (routine x 2)     Status: None   Collection Time: 12/17/19  3:20 PM   Specimen: BLOOD RIGHT HAND  Result Value Ref Range Status   Specimen Description BLOOD RIGHT HAND  Final   Special Requests   Final    BOTTLES DRAWN AEROBIC ONLY Blood Culture results may not be optimal due to an inadequate volume of blood received in culture bottles   Culture   Final    NO GROWTH 5 DAYS Performed at [facility], [place] [address] [place], [place] [address]    Report Status 12/22/2019 FINAL  Final    Coagulation Studies: No results for input(s): LABPROT, INR in the last 72 hours.  Urinalysis: No results for input(s): COLORURINE, LABSPEC, PHURINE, GLUCOSEU, HGBUR, BILIRUBINUR, KETONESUR, PROTEINUR, UROBILINOGEN, NITRITE, LEUKOCYTESUR in the last 72 hours.  Invalid input(s): APPERANCEUR    Imaging: No results found.   Medications:       Assessment/ Plan:  54 y.o. male with a PMHx of recent cardiac arrest status post hypothermia protocol, ESRD on HD MWF, anemia of chronic kidney disease, secondary hyperparathyroidism, history of polysubstance abuse, hypertension,, chronic systolic  heart failure, medical nonadherence who was admitted to [facility] on 12/24/2019 for ongoing management of acute respiratory failure.  1.  ESRD on HD MWF.  Patient seen during dialysis treatment.  Currently not requiring norepinephrine.  Monitor blood pressure trend closely during dialysis treatment.  2.  Anemia of chronic kidney disease.  Hemoglobin up to 9.7.  Maintain the patient on Retacrit.  3.  Secondary hyperparathyroidism.  Phosphorus acceptable at 3.0.  4.  Hyperkalemia.  Resolved.  5.  Hypotension.  Continue to monitor blood pressure closely during dialysis treatment.  Not requiring norepinephrine at the moment.  We have also written for albumin for blood pressure support during dialysis treatment.   LOS: 0 Glori Machnik 1/25/20218:16 AM

## 2020-01-09 NOTE — Procedures (Signed)
Ventilated, dysphagia  S/p fluoro 20 fr Gtube  No comp Stable ebl 0 Full use tomorrow Full report in pacs

## 2020-01-10 ENCOUNTER — Other Ambulatory Visit (HOSPITAL_COMMUNITY): Payer: Medicare Other

## 2020-01-10 DIAGNOSIS — N186 End stage renal disease: Secondary | ICD-10-CM | POA: Diagnosis not present

## 2020-01-10 DIAGNOSIS — I2699 Other pulmonary embolism without acute cor pulmonale: Secondary | ICD-10-CM | POA: Diagnosis not present

## 2020-01-10 DIAGNOSIS — Z9911 Dependence on respirator [ventilator] status: Secondary | ICD-10-CM | POA: Diagnosis not present

## 2020-01-10 DIAGNOSIS — K567 Ileus, unspecified: Secondary | ICD-10-CM | POA: Diagnosis not present

## 2020-01-10 DIAGNOSIS — I482 Chronic atrial fibrillation, unspecified: Secondary | ICD-10-CM | POA: Diagnosis not present

## 2020-01-10 DIAGNOSIS — J9690 Respiratory failure, unspecified, unspecified whether with hypoxia or hypercapnia: Secondary | ICD-10-CM | POA: Diagnosis not present

## 2020-01-10 DIAGNOSIS — Z992 Dependence on renal dialysis: Secondary | ICD-10-CM | POA: Diagnosis not present

## 2020-01-10 DIAGNOSIS — R079 Chest pain, unspecified: Secondary | ICD-10-CM | POA: Diagnosis not present

## 2020-01-10 DIAGNOSIS — I5043 Acute on chronic combined systolic (congestive) and diastolic (congestive) heart failure: Secondary | ICD-10-CM | POA: Diagnosis not present

## 2020-01-10 DIAGNOSIS — J9621 Acute and chronic respiratory failure with hypoxia: Secondary | ICD-10-CM | POA: Diagnosis not present

## 2020-01-10 DIAGNOSIS — R221 Localized swelling, mass and lump, neck: Secondary | ICD-10-CM | POA: Diagnosis not present

## 2020-01-10 DIAGNOSIS — I5040 Unspecified combined systolic (congestive) and diastolic (congestive) heart failure: Secondary | ICD-10-CM | POA: Diagnosis not present

## 2020-01-10 LAB — RENAL FUNCTION PANEL
Albumin: 3.1 g/dL — ABNORMAL LOW (ref 3.5–5.0)
Anion gap: 16 — ABNORMAL HIGH (ref 5–15)
BUN: 47 mg/dL — ABNORMAL HIGH (ref 6–20)
CO2: 21 mmol/L — ABNORMAL LOW (ref 22–32)
Calcium: 9.3 mg/dL (ref 8.9–10.3)
Chloride: 98 mmol/L (ref 98–111)
Creatinine, Ser: 4.82 mg/dL — ABNORMAL HIGH (ref 0.61–1.24)
GFR calc Af Amer: 15 mL/min — ABNORMAL LOW (ref >60)
GFR calc non Af Amer: 13 mL/min — ABNORMAL LOW (ref >60)
Glucose, Bld: 79 mg/dL (ref 70–99)
Phosphorus: 2.6 mg/dL (ref 2.5–4.6)
Potassium: 4.1 mmol/L (ref 3.5–5.1)
Sodium: 135 mmol/L (ref 135–145)

## 2020-01-10 LAB — CBC
HCT: 31.4 % — ABNORMAL LOW (ref 39.0–52.0)
Hemoglobin: 9.6 g/dL — ABNORMAL LOW (ref 13.0–17.0)
MCH: 27.3 pg (ref 26.0–34.0)
MCHC: 30.6 g/dL (ref 30.0–36.0)
MCV: 89.2 fL (ref 80.0–100.0)
Platelets: 229 10*3/uL (ref 150–400)
RBC: 3.52 MIL/uL — ABNORMAL LOW (ref 4.22–5.81)
RDW: 16.8 % — ABNORMAL HIGH (ref 11.5–15.5)
WBC: 5.3 10*3/uL (ref 4.0–10.5)
nRBC: 0 % (ref 0.0–0.2)

## 2020-01-10 LAB — MAGNESIUM: Magnesium: 1.8 mg/dL (ref 1.7–2.4)

## 2020-01-10 NOTE — Progress Notes (Signed)
Pulmonary [place]   PULMONARY CRITICAL CARE SERVICE  PROGRESS NOTE  Date of Service: 01/10/2020  [name] [name]  CWC:[id number]  DOB: 1966/04/02   DOA: 12/24/2019  Referring Physician: Merton Border, MD  HPI: [name] [name] is a 54 y.o. male seen for follow up of Acute on Chronic Respiratory Failure.  This morning the patient was weaning was on pressure support mode on 28% FiO2 pressure support of 12 with a PEEP of 5  Medications: Reviewed on Rounds  Physical Exam:  Vitals: Temperature 97.2 pulse 81 respiratory 20 blood pressure is 101/90 saturations 100%  Ventilator Settings mode ventilation pressure support FiO2 28% pressure 12 PEEP 5  . General: Comfortable at this time . Eyes: Grossly normal lids, irises & conjunctiva . ENT: grossly tongue is normal . Neck: no obvious mass . Cardiovascular: S1 S2 normal no gallop . Respiratory: No rhonchi coarse breath sounds are noted . Abdomen: soft . Skin: no rash seen on limited exam . Musculoskeletal: not rigid . Psychiatric:unable to assess . Neurologic: no seizure no involuntary movements         Lab Data:   Basic Metabolic Panel: Recent Labs  Lab 01/04/20 0449 01/06/20 0512 01/09/20 0536 01/10/20 0547  NA 134* 133* 133* 135  K 3.8 4.0 3.8 4.1  CL 95* 94* 91* 98  CO2 24 23 24  21*  GLUCOSE 114* 98 88 79  BUN 98* 97* 101* 47*  CREATININE 6.03* 5.73* 6.97* 4.82*  CALCIUM 9.5 9.6 9.3 9.3  PHOS 2.8 2.0* 3.0 2.6    ABG: No results for input(s): PHART, PCO2ART, PO2ART, HCO3, O2SAT in the last 168 hours.  Liver Function Tests: Recent Labs  Lab 01/04/20 0449 01/06/20 0512 01/09/20 0536 01/10/20 0547  ALBUMIN 3.0* 3.0* 3.0* 3.1*   No results for input(s): LIPASE, AMYLASE in the last 168 hours. No results for input(s): AMMONIA in the last 168 hours.  CBC: Recent Labs  Lab 01/04/20 0449 01/06/20 0512 01/09/20 0536 01/10/20 0547  WBC 7.1 7.0 6.1 5.3  HGB 9.2* 8.9*  9.7* 9.6*  HCT 30.2* 29.0* 31.4* 31.4*  MCV 91.5 90.1 89.5 89.2  PLT 278 274 263 229    Cardiac Enzymes: No results for input(s): CKTOTAL, CKMB, CKMBINDEX, TROPONINI in the last 168 hours.  BNP (last 3 results) Recent Labs    11/16/19 1356  BNP 962.8*    ProBNP (last 3 results) No results for input(s): PROBNP in the last 8760 hours.  Radiological Exams: IR GASTROSTOMY TUBE MOD SED  Result Date: 01/09/2020 INDICATION: Acute respiratory failure, ventilated, end-stage renal disease, dysphagia none nutrition EXAM: FLUOROSCOPIC 20 FRENCH PULL-THROUGH GASTROSTOMY Date:  01/09/2020 01/09/2020 1:21 pm Radiologist:  Jerilynn Mages. Daryll Brod, MD Guidance:  Ultrasound fluoroscopic MEDICATIONS: Ancef 2 g within 1 hour of the procedure; Antibiotics were administered within 1 hour of the procedure. Glucagon 0.5 mg IV ANESTHESIA/SEDATION: Versed 1.5 mg IV; Fentanyl 100 mcg IV Moderate Sedation Time:  10 minutes The patient was continuously monitored during the procedure by the interventional radiology nurse under my direct supervision. CONTRAST:  52mL OMNIPAQUE IOHEXOL 300 MG/ML SOLN - administered into the gastric lumen. FLUOROSCOPY TIME:  Fluoroscopy Time: 2 minutes 24 seconds (94 mGy). COMPLICATIONS: None immediate. PROCEDURE: Informed consent was obtained from the patient following explanation of the procedure, risks, benefits and alternatives. The patient understands, agrees and consents for the procedure. All questions were addressed. A time out was performed. Maximal barrier sterile technique utilized including caps, mask, sterile gowns, sterile gloves,  large sterile drape, hand hygiene, and betadine prep. The left upper quadrant was sterilely prepped and draped. An oral gastric catheter was inserted into the stomach under fluoroscopy. The existing nasogastric feeding tube was removed. Air was injected into the stomach for insufflation and visualization under fluoroscopy. The air distended stomach was confirmed  beneath the anterior abdominal wall in the frontal and lateral projections. Under sterile conditions and local anesthesia, a 62 gauge trocar needle was utilized to access the stomach percutaneously beneath the left subcostal margin. Needle position was confirmed within the stomach under biplane fluoroscopy. Contrast injection confirmed position also. A single T tack was deployed for gastropexy. Over an Amplatz guide wire, a 9-French sheath was inserted into the stomach. A snare device was utilized to capture the oral gastric catheter. The snare device was pulled retrograde from the stomach up the esophagus and out the oropharynx. The 20-French pull-through gastrostomy was connected to the snare device and pulled antegrade through the oropharynx down the esophagus into the stomach and then through the percutaneous tract external to the patient. The gastrostomy was assembled externally. Contrast injection confirms position in the stomach. Images were obtained for documentation. The patient tolerated procedure well. No immediate complication. IMPRESSION: Fluoroscopic insertion of a 20-French "pull-through" gastrostomy. Electronically Signed   By: Jerilynn Mages.  Shick M.D.   On: 01/09/2020 13:33    Assessment/Plan Active Problems:   Acute on chronic respiratory failure with hypoxia (HCC)   End stage renal disease on dialysis (HCC)   Acute on chronic systolic and diastolic heart failure, NYHA class 4 (HCC)   Chronic atrial fibrillation ([place])   Acute pulmonary embolism without acute cor pulmonale ([place])   1. Acute on chronic respiratory failure hypoxia continue with pressure support mode on 28% FiO2 currently is on level of 12/5 2. End-stage renal disease followed by nephrology for dialysis 3. Acute on chronic diastolic heart failure right now appears to be compensated monitor fluid status closely 4. Chronic atrial fibrillation rate is controlled 5. Pulmonary embolism treated we will continue with supportive  care   I have personally seen and evaluated the patient, evaluated laboratory and imaging results, formulated the assessment and plan and placed orders. The Patient requires high complexity decision making with multiple systems involvement.  Rounds were done with the Respiratory Therapy Director and Staff therapists and discussed with nursing staff also.  Allyne Gee, MD [facility] Pulmonary Critical Care Medicine Sleep Medicine

## 2020-01-11 DIAGNOSIS — N186 End stage renal disease: Secondary | ICD-10-CM | POA: Diagnosis not present

## 2020-01-11 DIAGNOSIS — I4821 Permanent atrial fibrillation: Secondary | ICD-10-CM | POA: Diagnosis not present

## 2020-01-11 DIAGNOSIS — I5021 Acute systolic (congestive) heart failure: Secondary | ICD-10-CM | POA: Diagnosis not present

## 2020-01-11 DIAGNOSIS — I313 Pericardial effusion (noninflammatory): Secondary | ICD-10-CM | POA: Diagnosis not present

## 2020-01-11 DIAGNOSIS — D631 Anemia in chronic kidney disease: Secondary | ICD-10-CM | POA: Diagnosis not present

## 2020-01-11 DIAGNOSIS — I2699 Other pulmonary embolism without acute cor pulmonale: Secondary | ICD-10-CM | POA: Diagnosis not present

## 2020-01-11 DIAGNOSIS — J9621 Acute and chronic respiratory failure with hypoxia: Secondary | ICD-10-CM | POA: Diagnosis not present

## 2020-01-11 DIAGNOSIS — I959 Hypotension, unspecified: Secondary | ICD-10-CM | POA: Diagnosis not present

## 2020-01-11 DIAGNOSIS — N2581 Secondary hyperparathyroidism of renal origin: Secondary | ICD-10-CM | POA: Diagnosis not present

## 2020-01-11 DIAGNOSIS — I5040 Unspecified combined systolic (congestive) and diastolic (congestive) heart failure: Secondary | ICD-10-CM | POA: Diagnosis not present

## 2020-01-11 DIAGNOSIS — J9690 Respiratory failure, unspecified, unspecified whether with hypoxia or hypercapnia: Secondary | ICD-10-CM | POA: Diagnosis not present

## 2020-01-11 DIAGNOSIS — Z9911 Dependence on respirator [ventilator] status: Secondary | ICD-10-CM | POA: Diagnosis not present

## 2020-01-11 DIAGNOSIS — I482 Chronic atrial fibrillation, unspecified: Secondary | ICD-10-CM | POA: Diagnosis not present

## 2020-01-11 DIAGNOSIS — Z992 Dependence on renal dialysis: Secondary | ICD-10-CM | POA: Diagnosis not present

## 2020-01-11 DIAGNOSIS — I5043 Acute on chronic combined systolic (congestive) and diastolic (congestive) heart failure: Secondary | ICD-10-CM | POA: Diagnosis not present

## 2020-01-11 LAB — CBC
HCT: 29.8 % — ABNORMAL LOW (ref 39.0–52.0)
Hemoglobin: 9.3 g/dL — ABNORMAL LOW (ref 13.0–17.0)
MCH: 27.5 pg (ref 26.0–34.0)
MCHC: 31.2 g/dL (ref 30.0–36.0)
MCV: 88.2 fL (ref 80.0–100.0)
Platelets: 196 10*3/uL (ref 150–400)
RBC: 3.38 MIL/uL — ABNORMAL LOW (ref 4.22–5.81)
RDW: 16.5 % — ABNORMAL HIGH (ref 11.5–15.5)
WBC: 4 10*3/uL (ref 4.0–10.5)
nRBC: 0 % (ref 0.0–0.2)

## 2020-01-11 LAB — POTASSIUM: Potassium: 3.8 mmol/L (ref 3.5–5.1)

## 2020-01-11 LAB — RENAL FUNCTION PANEL
Albumin: 2.9 g/dL — ABNORMAL LOW (ref 3.5–5.0)
Anion gap: 15 (ref 5–15)
BUN: 45 mg/dL — ABNORMAL HIGH (ref 6–20)
CO2: 22 mmol/L (ref 22–32)
Calcium: 9 mg/dL (ref 8.9–10.3)
Chloride: 94 mmol/L — ABNORMAL LOW (ref 98–111)
Creatinine, Ser: 4.85 mg/dL — ABNORMAL HIGH (ref 0.61–1.24)
GFR calc Af Amer: 15 mL/min — ABNORMAL LOW (ref >60)
GFR calc non Af Amer: 13 mL/min — ABNORMAL LOW (ref >60)
Glucose, Bld: 83 mg/dL (ref 70–99)
Phosphorus: 2.4 mg/dL — ABNORMAL LOW (ref 2.5–4.6)
Potassium: 3.4 mmol/L — ABNORMAL LOW (ref 3.5–5.1)
Sodium: 131 mmol/L — ABNORMAL LOW (ref 135–145)

## 2020-01-11 LAB — MAGNESIUM: Magnesium: 1.8 mg/dL (ref 1.7–2.4)

## 2020-01-11 NOTE — Progress Notes (Signed)
Pulmonary [place]   PULMONARY CRITICAL CARE SERVICE  PROGRESS NOTE  Date of Service: 01/11/2020  [name] [name]  [id number]  DOB: [birth year]   DOA: 12/24/2019  Referring Physician: Merton Border, MD  HPI: [name] [name] is a 54 y.o. male seen for follow up of Acute on Chronic Respiratory Failure.  Patient is on full support currently on assist control mode was able to do about 10 hours of pressure support yesterday without any major issues.  Patient will be resumed on weaning today.  Medications: Reviewed on Rounds  Physical Exam:  Vitals: Temperature is 96.7 pulse 88 respiratory rate 20 blood pressure is 117/82 saturations 100%  Ventilator Settings mode ventilation assist control FiO2 20% tidal volumes 540 PEEP 5  . General: Comfortable at this time . Eyes: Grossly normal lids, irises & conjunctiva . ENT: grossly tongue is normal . Neck: no obvious mass . Cardiovascular: S1 S2 normal no gallop . Respiratory: No rhonchi no rales are noted at this time . Abdomen: soft . Skin: no rash seen on limited exam . Musculoskeletal: not rigid . Psychiatric:unable to assess . Neurologic: no seizure no involuntary movements         Lab Data:   Basic Metabolic Panel: Recent Labs  Lab 01/06/20 0512 01/09/20 0536 01/10/20 0547 01/11/20 0553  NA 133* 133* 135 131*  K 4.0 3.8 4.1 3.4*  CL 94* 91* 98 94*  CO2 23 24 21* 22  GLUCOSE 98 88 79 83  BUN 97* 101* 47* 45*  CREATININE 5.73* 6.97* 4.82* 4.85*  CALCIUM 9.6 9.3 9.3 9.0  MG  --   --  1.8  --   PHOS 2.0* 3.0 2.6 2.4*    ABG: No results for input(s): PHART, PCO2ART, PO2ART, HCO3, O2SAT in the last 168 hours.  Liver Function Tests: Recent Labs  Lab 01/06/20 0512 01/09/20 0536 01/10/20 0547 01/11/20 0553  ALBUMIN 3.0* 3.0* 3.1* 2.9*   No results for input(s): LIPASE, AMYLASE in the last 168 hours. No results for input(s): AMMONIA in the last 168 hours.  CBC: Recent  Labs  Lab 01/06/20 0512 01/09/20 0536 01/10/20 0547 01/11/20 0553  WBC 7.0 6.1 5.3 4.0  HGB 8.9* 9.7* 9.6* 9.3*  HCT 29.0* 31.4* 31.4* 29.8*  MCV 90.1 89.5 89.2 88.2  PLT 274 263 229 196    Cardiac Enzymes: No results for input(s): CKTOTAL, CKMB, CKMBINDEX, TROPONINI in the last 168 hours.  BNP (last 3 results) Recent Labs    11/16/19 1356  BNP 962.8*    ProBNP (last 3 results) No results for input(s): PROBNP in the last 8760 hours.  Radiological Exams: DG Neck Soft Tissue  Result Date: 01/10/2020 CLINICAL DATA:  54 year old male with neck swelling.  Tracheostomy. EXAM: NECK SOFT TISSUES - 1+ VIEW COMPARISON:  Portable chest today reported separately. FINDINGS: Portable AP and lateral views of the neck soft tissues. Tracheostomy positioning appears satisfactory. There is a right IJ approach multi lumen central line catheter with no adverse features identified. Visualized tracheal air column is within normal limits. There is nonspecific prevertebral and/or retropharyngeal soft tissue thickening on the lateral, up to 11 millimeters at the C2 level. Other pharyngeal contours appear within normal limits. Negative lung apices.  No acute osseous abnormality identified. IMPRESSION: 1. Abnormal but nonspecific prevertebral/retropharyngeal soft tissue thickening. Neck CT (IV contrast preferred) would further characterize. 2. Otherwise negative radiographic appearance of the neck. Tracheostomy tube and right IJ central line with no adverse features. Electronically Signed  By: Genevie Ann M.D.   On: 01/10/2020 20:19   DG Abd 1 View  Result Date: 01/10/2020 CLINICAL DATA:  54 year old male with ileus. EXAM: ABDOMEN - 1 VIEW COMPARISON:  CT Abdomen and Pelvis 01/04/2020 and earlier. FINDINGS: Portable AP supine view at 1959 hours. Percutaneous gastrostomy tube has been placed and enteric tube removed from the recent CT. Bowel gas pattern otherwise is stable and nonobstructed. No acute osseous  abnormality identified. Stable abdominal and pelvic visceral contours. Lung bases not included. IMPRESSION: 1. Percutaneous gastrostomy tube placed since the CT on 01/04/2020. 2. Bowel-gas pattern appears stable and within normal limits, not suggestive of ileus at this time. Electronically Signed   By: Genevie Ann M.D.   On: 01/10/2020 20:14   IR GASTROSTOMY TUBE MOD SED  Result Date: 01/09/2020 INDICATION: Acute respiratory failure, ventilated, end-stage renal disease, dysphagia none nutrition EXAM: FLUOROSCOPIC 20 FRENCH PULL-THROUGH GASTROSTOMY Date:  01/09/2020 01/09/2020 1:21 pm Radiologist:  Jerilynn Mages. Daryll Brod, MD Guidance:  Ultrasound fluoroscopic MEDICATIONS: Ancef 2 g within 1 hour of the procedure; Antibiotics were administered within 1 hour of the procedure. Glucagon 0.5 mg IV ANESTHESIA/SEDATION: Versed 1.5 mg IV; Fentanyl 100 mcg IV Moderate Sedation Time:  10 minutes The patient was continuously monitored during the procedure by the interventional radiology nurse under my direct supervision. CONTRAST:  2mL OMNIPAQUE IOHEXOL 300 MG/ML SOLN - administered into the gastric lumen. FLUOROSCOPY TIME:  Fluoroscopy Time: 2 minutes 24 seconds (94 mGy). COMPLICATIONS: None immediate. PROCEDURE: Informed consent was obtained from the patient following explanation of the procedure, risks, benefits and alternatives. The patient understands, agrees and consents for the procedure. All questions were addressed. A time out was performed. Maximal barrier sterile technique utilized including caps, mask, sterile gowns, sterile gloves, large sterile drape, hand hygiene, and betadine prep. The left upper quadrant was sterilely prepped and draped. An oral gastric catheter was inserted into the stomach under fluoroscopy. The existing nasogastric feeding tube was removed. Air was injected into the stomach for insufflation and visualization under fluoroscopy. The air distended stomach was confirmed beneath the anterior abdominal  wall in the frontal and lateral projections. Under sterile conditions and local anesthesia, a 67 gauge trocar needle was utilized to access the stomach percutaneously beneath the left subcostal margin. Needle position was confirmed within the stomach under biplane fluoroscopy. Contrast injection confirmed position also. A single T tack was deployed for gastropexy. Over an Amplatz guide wire, a 9-French sheath was inserted into the stomach. A snare device was utilized to capture the oral gastric catheter. The snare device was pulled retrograde from the stomach up the esophagus and out the oropharynx. The 20-French pull-through gastrostomy was connected to the snare device and pulled antegrade through the oropharynx down the esophagus into the stomach and then through the percutaneous tract external to the patient. The gastrostomy was assembled externally. Contrast injection confirms position in the stomach. Images were obtained for documentation. The patient tolerated procedure well. No immediate complication. IMPRESSION: Fluoroscopic insertion of a 20-French "pull-through" gastrostomy. Electronically Signed   By: Jerilynn Mages.  Shick M.D.   On: 01/09/2020 13:33   DG Chest Port 1 View  Result Date: 01/10/2020 CLINICAL DATA:  54 year old male with chest pain and shortness of breath. EXAM: PORTABLE CHEST 1 VIEW COMPARISON:  CT Abdomen and Pelvis 01/04/2020. Portable chest 12/25/2019. Chest CTA 11/16/2019. FINDINGS: Portable AP semi upright view at 1954 hours. Cardiac silhouette appears improved from prior studies where a substantial pericardial effusion was present. Other mediastinal contours are  within normal limits. Tracheostomy tube projects over the midline. Right IJ central line appear stable. Enteric feeding tube has been removed. Low lung volumes with dense bibasilar opacity greater on the left. No pneumothorax or pulmonary edema. Paucity of bowel gas in the upper abdomen. Chronic right lateral rib fractures.  IMPRESSION: 1. Suspect regressed pericardial effusion since 01/04/2020. 2. Low lung volumes with bilateral lower lung collapse or consolidation. Electronically Signed   By: Genevie Ann M.D.   On: 01/10/2020 20:17    Assessment/Plan Active Problems:   Acute on chronic respiratory failure with hypoxia (HCC)   End stage renal disease on dialysis (HCC)   Acute on chronic systolic and diastolic heart failure, NYHA class 4 (HCC)   Chronic atrial fibrillation ([place])   Acute pulmonary embolism without acute cor pulmonale ([place])   1. Acute on chronic respiratory failure hypoxia plan is to continue to make attempts at weaning to 10 hours of pressure support yesterday as already noted we will try to advance further 2. End-stage renal disease on hemodialysis continue supportive care 3. Acute on chronic diastolic heart failure monitor fluid status 4. Chronic atrial fibrillation rate controlled 5. Acute pulmonary embolism treated we will continue follow-up   I have personally seen and evaluated the patient, evaluated laboratory and imaging results, formulated the assessment and plan and placed orders. The Patient requires high complexity decision making with multiple systems involvement.  Rounds were done with the Respiratory Therapy Director and Staff therapists and discussed with nursing staff also.  Allyne Gee, MD [facility] Pulmonary Critical Care Medicine Sleep Medicine

## 2020-01-11 NOTE — Progress Notes (Signed)
[facility]  ROUNDING NOTE   Subjective:  Patient seen during dialysis treatment. Tolerating well. Patient having neck swelling. Tracheostomy securing strap appeared to be tight.     Objective:  Vital signs in last 24 hours:  Temperature 96.7 pulse 85 respirations 20 blood pressure 117/82  Physical Exam: General: Chronically ill-appearing  Head: Normocephalic, atraumatic. Moist oral mucosal membranes  Eyes: Anicteric  Neck: Tracheostomy present, neck swelling noted  Lungs:  Scattered rhonchi  Heart: S1S2 no rubs  Abdomen:  Soft, nontender, bowel sounds present, distended  Extremities: Trace peripheral edema.  Neurologic: Awake, alert, following commands  Skin: Right and left toes covered  Access: Right IJ temporary dialysis catheter    Basic Metabolic Panel: Recent Labs  Lab 01/06/20 0512 01/09/20 0536 01/10/20 0547  NA 133* 133* 135  K 4.0 3.8 4.1  CL 94* 91* 98  CO2 23 24 21*  GLUCOSE 98 88 79  BUN 97* 101* 47*  CREATININE 5.73* 6.97* 4.82*  CALCIUM 9.6 9.3 9.3  MG  --   --  1.8  PHOS 2.0* 3.0 2.6    Liver Function Tests: Recent Labs  Lab 01/06/20 0512 01/09/20 0536 01/10/20 0547  ALBUMIN 3.0* 3.0* 3.1*   No results for input(s): LIPASE, AMYLASE in the last 168 hours. No results for input(s): AMMONIA in the last 168 hours.  CBC: Recent Labs  Lab 01/06/20 0512 01/09/20 0536 01/10/20 0547 01/11/20 0553  WBC 7.0 6.1 5.3 4.0  HGB 8.9* 9.7* 9.6* 9.3*  HCT 29.0* 31.4* 31.4* 29.8*  MCV 90.1 89.5 89.2 88.2  PLT 274 263 229 196    Cardiac Enzymes: No results for input(s): CKTOTAL, CKMB, CKMBINDEX, TROPONINI in the last 168 hours.  BNP: Invalid input(s): POCBNP  CBG: No results for input(s): GLUCAP in the last 168 hours.  Microbiology: Results for orders placed or performed during the hospital encounter of 11/16/19  SARS Coronavirus 2 by RT PCR (hospital order, performed in [facility] hospital lab) Nasopharyngeal  Nasopharyngeal Swab     Status: None   Collection Time: 11/16/19 11:41 AM   Specimen: Nasopharyngeal Swab  Result Value Ref Range Status   SARS Coronavirus 2 NEGATIVE NEGATIVE Final    Comment: (NOTE) SARS-CoV-2 target nucleic acids are NOT DETECTED. The SARS-CoV-2 RNA is generally detectable in upper and lower respiratory specimens during the acute phase of infection. The lowest concentration of SARS-CoV-2 viral copies this assay can detect is 250 copies / mL. A negative result does not preclude SARS-CoV-2 infection and should not be used as the sole basis for treatment or other patient management decisions.  A negative result may occur with improper specimen collection / handling, submission of specimen other than nasopharyngeal swab, presence of viral mutation(s) within the areas targeted by this assay, and inadequate number of viral copies (<250 copies / mL). A negative result must be combined with clinical observations, patient history, and epidemiological information. Fact Sheet for Patients:   StrictlyIdeas.no Fact Sheet for Healthcare Providers: BankingDealers.co.za This test is not yet approved or cleared  by the [place] FDA and has been authorized for detection and/or diagnosis of SARS-CoV-2 by FDA under an Emergency Use Authorization (EUA).  This EUA will remain in effect (meaning this test can be used) for the duration of the COVID-19 declaration under Section 564(b)(1) of the Act, 21 U.S.C. section 360bbb-3(b)(1), unless the authorization is terminated or revoked sooner. Performed at [facility], [place] [address], [place], [place] [address]   Culture, blood (  routine x 2)     Status: None   Collection Time: 11/16/19  6:03 PM   Specimen: BLOOD RIGHT HAND  Result Value Ref Range Status   Specimen Description BLOOD RIGHT HAND  Final   Special Requests   Final    BOTTLES DRAWN AEROBIC AND ANAEROBIC Blood Culture  results may not be optimal due to an inadequate volume of blood received in culture bottles   Culture   Final    NO GROWTH 5 DAYS Performed at [facility], [place] [address] [place], [place] [address]    Report Status 11/21/2019 FINAL  Final  MRSA PCR Screening     Status: None   Collection Time: 11/16/19  6:05 PM   Specimen: Nasal Mucosa; Nasopharyngeal  Result Value Ref Range Status   MRSA by PCR NEGATIVE NEGATIVE Final    Comment:        The GeneXpert MRSA Assay (FDA approved for NASAL specimens only), is one component of a comprehensive MRSA colonization surveillance program. It is not intended to diagnose MRSA infection nor to guide or monitor treatment for MRSA infections. Performed at [facility], [place] [address], [place], [place] [address]   Culture, blood (routine x 2)     Status: None   Collection Time: 11/16/19  6:08 PM   Specimen: BLOOD RIGHT HAND  Result Value Ref Range Status   Specimen Description BLOOD RIGHT HAND  Final   Special Requests   Final    BOTTLES DRAWN AEROBIC ONLY Blood Culture results may not be optimal due to an inadequate volume of blood received in culture bottles   Culture   Final    NO GROWTH 5 DAYS Performed at [facility], [place] [address], [place], [place] [address]    Report Status 11/21/2019 FINAL  Final  Culture, respiratory (non-expectorated)     Status: None   Collection Time: 11/18/19 12:28 PM   Specimen: Tracheal Aspirate; Respiratory  Result Value Ref Range Status   Specimen Description TRACHEAL ASPIRATE  Final   Special Requests NONE  Final   Gram Stain   Final    ABUNDANT WBC PRESENT,BOTH PMN AND MONONUCLEAR ABUNDANT GRAM POSITIVE COCCI ABUNDANT GRAM NEGATIVE RODS FEW GRAM VARIABLE ROD    Culture   Final    ABUNDANT HAEMOPHILUS INFLUENZAE BETA LACTAMASE POSITIVE Performed at [facility], [place] [address], [place], [place] [address]    Report Status 11/20/2019 FINAL  Final  Surgical pcr screen      Status: Abnormal   Collection Time: 11/24/19  7:53 AM   Specimen: Nasal Mucosa; Nasal Swab  Result Value Ref Range Status   MRSA, PCR NEGATIVE NEGATIVE Final   Staphylococcus aureus POSITIVE (A) NEGATIVE Final    Comment: (NOTE) The Xpert SA Assay (FDA approved for NASAL specimens in patients 58 years of age and older), is one component of a comprehensive surveillance program. It is not intended to diagnose infection nor to guide or monitor treatment. Performed at [facility], [place] [address], [place], [place] [address]   Culture, blood (routine x 2)     Status: None   Collection Time: 11/25/19  3:30 PM   Specimen: BLOOD  Result Value Ref Range Status   Specimen Description BLOOD RIGHT ANTECUBITAL  Final   Special Requests   Final    BOTTLES DRAWN AEROBIC ONLY Blood Culture results may not be optimal due to an inadequate volume of blood received in culture bottles  Culture   Final    NO GROWTH 5 DAYS Performed at [facility], [place] [address], [place], [place] [address]    Report Status 11/30/2019 FINAL  Final  Culture, blood (routine x 2)     Status: None   Collection Time: 11/25/19  3:30 PM   Specimen: BLOOD RIGHT HAND  Result Value Ref Range Status   Specimen Description BLOOD RIGHT HAND  Final   Special Requests   Final    BOTTLES DRAWN AEROBIC AND ANAEROBIC Blood Culture results may not be optimal due to an inadequate volume of blood received in culture bottles   Culture   Final    NO GROWTH 5 DAYS Performed at [facility], [place] [address], [place], [place] [address]    Report Status 11/30/2019 FINAL  Final  Culture, respiratory (non-expectorated)     Status: None   Collection Time: 11/25/19  4:35 PM   Specimen: Tracheal Aspirate; Respiratory  Result Value Ref Range Status   Specimen Description TRACHEAL ASPIRATE  Final   Special Requests NONE  Final   Gram Stain   Final    ABUNDANT WBC PRESENT,BOTH PMN AND MONONUCLEAR FEW GRAM POSITIVE  COCCI Performed at [facility], [place] [address], [place], [place] [address]    Culture FEW STAPHYLOCOCCUS EPIDERMIDIS  Final   Report Status 11/28/2019 FINAL  Final   Organism ID, Bacteria STAPHYLOCOCCUS EPIDERMIDIS  Final      Susceptibility   Staphylococcus epidermidis - MIC*    CIPROFLOXACIN <=0.5 SENSITIVE Sensitive     ERYTHROMYCIN >=8 RESISTANT Resistant     GENTAMICIN <=0.5 SENSITIVE Sensitive     OXACILLIN >=4 RESISTANT Resistant     TETRACYCLINE <=1 SENSITIVE Sensitive     VANCOMYCIN 2 SENSITIVE Sensitive     TRIMETH/SULFA <=10 SENSITIVE Sensitive     CLINDAMYCIN >=8 RESISTANT Resistant     RIFAMPIN <=0.5 SENSITIVE Sensitive     Inducible Clindamycin NEGATIVE Sensitive     * FEW STAPHYLOCOCCUS EPIDERMIDIS  Culture, respiratory (non-expectorated)     Status: None   Collection Time: 12/15/19  4:12 PM   Specimen: Tracheal Aspirate; Respiratory  Result Value Ref Range Status   Specimen Description TRACHEAL ASPIRATE  Final   Special Requests NONE  Final   Gram Stain   Final    MODERATE WBC PRESENT, PREDOMINANTLY PMN MODERATE GRAM POSITIVE COCCI IN PAIRS IN CLUSTERS Performed at [facility], [address], [place], [place] [address]    Culture MODERATE STAPHYLOCOCCUS AUREUS  Final   Report Status 12/17/2019 FINAL  Final   Organism ID, Bacteria STAPHYLOCOCCUS AUREUS  Final      Susceptibility   Staphylococcus aureus - MIC*    CIPROFLOXACIN <=0.5 SENSITIVE Sensitive     ERYTHROMYCIN <=0.25 SENSITIVE Sensitive     GENTAMICIN <=0.5 SENSITIVE Sensitive     OXACILLIN <=0.25 SENSITIVE Sensitive     TETRACYCLINE <=1 SENSITIVE Sensitive     VANCOMYCIN 1 SENSITIVE Sensitive     TRIMETH/SULFA <=10 SENSITIVE Sensitive     CLINDAMYCIN <=0.25 SENSITIVE Sensitive     RIFAMPIN <=0.5 SENSITIVE Sensitive     Inducible Clindamycin NEGATIVE Sensitive     * MODERATE STAPHYLOCOCCUS AUREUS  MRSA PCR Screening     Status: None   Collection Time: 12/17/19 10:25 AM   Specimen:  Nasopharyngeal  Result Value Ref Range Status   MRSA by PCR NEGATIVE NEGATIVE Final    Comment:        The  GeneXpert MRSA Assay (FDA approved for NASAL specimens only), is one component of a comprehensive MRSA colonization surveillance program. It is not intended to diagnose MRSA infection nor to guide or monitor treatment for MRSA infections. Performed at [facility], [place] [address] [place], [place] [address]   Culture, blood (routine x 2)     Status: None   Collection Time: 12/17/19  3:15 PM   Specimen: BLOOD  Result Value Ref Range Status   Specimen Description BLOOD RIGHT ANTECUBITAL  Final   Special Requests   Final    BOTTLES DRAWN AEROBIC AND ANAEROBIC Blood Culture results may not be optimal due to an inadequate volume of blood received in culture bottles   Culture   Final    NO GROWTH 5 DAYS Performed at [facility], [place] [address], [place], [place] [address]    Report Status 12/22/2019 FINAL  Final  Culture, blood (routine x 2)     Status: None   Collection Time: 12/17/19  3:20 PM   Specimen: BLOOD RIGHT HAND  Result Value Ref Range Status   Specimen Description BLOOD RIGHT HAND  Final   Special Requests   Final    BOTTLES DRAWN AEROBIC ONLY Blood Culture results may not be optimal due to an inadequate volume of blood received in culture bottles   Culture   Final    NO GROWTH 5 DAYS Performed at [facility], [place] [address] [place], [place] [address]    Report Status 12/22/2019 FINAL  Final    Coagulation Studies: No results for input(s): LABPROT, INR in the last 72 hours.  Urinalysis: No results for input(s): COLORURINE, LABSPEC, PHURINE, GLUCOSEU, HGBUR, BILIRUBINUR, KETONESUR, PROTEINUR, UROBILINOGEN, NITRITE, LEUKOCYTESUR in the last 72 hours.  Invalid input(s): APPERANCEUR    Imaging: DG Neck Soft Tissue  Result Date: 01/10/2020 CLINICAL DATA:  54 year old male with neck swelling.  Tracheostomy. EXAM: NECK SOFT TISSUES - 1+  VIEW COMPARISON:  Portable chest today reported separately. FINDINGS: Portable AP and lateral views of the neck soft tissues. Tracheostomy positioning appears satisfactory. There is a right IJ approach multi lumen central line catheter with no adverse features identified. Visualized tracheal air column is within normal limits. There is nonspecific prevertebral and/or retropharyngeal soft tissue thickening on the lateral, up to 11 millimeters at the C2 level. Other pharyngeal contours appear within normal limits. Negative lung apices.  No acute osseous abnormality identified. IMPRESSION: 1. Abnormal but nonspecific prevertebral/retropharyngeal soft tissue thickening. Neck CT (IV contrast preferred) would further characterize. 2. Otherwise negative radiographic appearance of the neck. Tracheostomy tube and right IJ central line with no adverse features. Electronically Signed   By: Genevie Ann M.D.   On: 01/10/2020 20:19   DG Abd 1 View  Result Date: 01/10/2020 CLINICAL DATA:  54 year old male with ileus. EXAM: ABDOMEN - 1 VIEW COMPARISON:  CT Abdomen and Pelvis 01/04/2020 and earlier. FINDINGS: Portable AP supine view at 1959 hours. Percutaneous gastrostomy tube has been placed and enteric tube removed from the recent CT. Bowel gas pattern otherwise is stable and nonobstructed. No acute osseous abnormality identified. Stable abdominal and pelvic visceral contours. Lung bases not included. IMPRESSION: 1. Percutaneous gastrostomy tube placed since the CT on 01/04/2020. 2. Bowel-gas pattern appears stable and within normal limits, not suggestive of ileus at this time. Electronically Signed   By: Genevie Ann M.D.   On: 01/10/2020 20:14   IR GASTROSTOMY TUBE MOD SED  Result Date: 01/09/2020 INDICATION: Acute respiratory  failure, ventilated, end-stage renal disease, dysphagia none nutrition EXAM: FLUOROSCOPIC [place] Date:  01/09/2020 01/09/2020 1:21 pm Radiologist:  M. Daryll Brod, MD Guidance:   Ultrasound fluoroscopic MEDICATIONS: Ancef 2 g within 1 hour of the procedure; Antibiotics were administered within 1 hour of the procedure. Glucagon 0.5 mg IV ANESTHESIA/SEDATION: Versed 1.5 mg IV; Fentanyl 100 mcg IV Moderate Sedation Time:  10 minutes The patient was continuously monitored during the procedure by the interventional radiology nurse under my direct supervision. CONTRAST:  54mL OMNIPAQUE IOHEXOL 300 MG/ML SOLN - administered into the gastric lumen. FLUOROSCOPY TIME:  Fluoroscopy Time: 2 minutes 24 seconds (94 mGy). COMPLICATIONS: None immediate. PROCEDURE: Informed consent was obtained from the patient following explanation of the procedure, risks, benefits and alternatives. The patient understands, agrees and consents for the procedure. All questions were addressed. A time out was performed. Maximal barrier sterile technique utilized including caps, mask, sterile gowns, sterile gloves, large sterile drape, hand hygiene, and betadine prep. The left upper quadrant was sterilely prepped and draped. An oral gastric catheter was inserted into the stomach under fluoroscopy. The existing nasogastric feeding tube was removed. Air was injected into the stomach for insufflation and visualization under fluoroscopy. The air distended stomach was confirmed beneath the anterior abdominal wall in the frontal and lateral projections. Under sterile conditions and local anesthesia, a 3 gauge trocar needle was utilized to access the stomach percutaneously beneath the left subcostal margin. Needle position was confirmed within the stomach under biplane fluoroscopy. Contrast injection confirmed position also. A single T tack was deployed for gastropexy. Over an Amplatz guide wire, a 9-French sheath was inserted into the stomach. A snare device was utilized to capture the oral gastric catheter. The snare device was pulled retrograde from the stomach up the esophagus and out the oropharynx. The 20-French pull-through  gastrostomy was connected to the snare device and pulled antegrade through the oropharynx down the esophagus into the stomach and then through the percutaneous tract external to the patient. The gastrostomy was assembled externally. Contrast injection confirms position in the stomach. Images were obtained for documentation. The patient tolerated procedure well. No immediate complication. IMPRESSION: Fluoroscopic insertion of a 20-French "pull-through" gastrostomy. Electronically Signed   By: Jerilynn Mages.  Shick M.D.   On: 01/09/2020 13:33   DG Chest Port 1 View  Result Date: 01/10/2020 CLINICAL DATA:  54 year old male with chest pain and shortness of breath. EXAM: PORTABLE CHEST 1 VIEW COMPARISON:  CT Abdomen and Pelvis 01/04/2020. Portable chest 12/25/2019. Chest CTA 11/16/2019. FINDINGS: Portable AP semi upright view at 1954 hours. Cardiac silhouette appears improved from prior studies where a substantial pericardial effusion was present. Other mediastinal contours are within normal limits. Tracheostomy tube projects over the midline. Right IJ central line appear stable. Enteric feeding tube has been removed. Low lung volumes with dense bibasilar opacity greater on the left. No pneumothorax or pulmonary edema. Paucity of bowel gas in the upper abdomen. Chronic right lateral rib fractures. IMPRESSION: 1. Suspect regressed pericardial effusion since 01/04/2020. 2. Low lung volumes with bilateral lower lung collapse or consolidation. Electronically Signed   By: Genevie Ann M.D.   On: 01/10/2020 20:17     Medications:       Assessment/ Plan:  54 y.o. male with a PMHx of recent cardiac arrest status post hypothermia protocol, ESRD on HD MWF, anemia of chronic kidney disease, secondary hyperparathyroidism, history of polysubstance abuse, hypertension,, chronic systolic heart failure, medical nonadherence who was admitted to [facility] on 12/24/2019 for  ongoing management of acute respiratory failure.  1.  ESRD on HD MWF.   Patient seen during dialysis treatment.  Tolerating well.  Not requiring norepinephrine today.  Continue to monitor.  2.  Anemia of chronic kidney disease.  Hemoglobin 9.3.  Maintain the patient on Retacrit.  3.  Secondary hyperparathyroidism.  Serum phosphorus currently 2.6.  Continue to periodically monitor.  4.  Hyperkalemia.  Resolved.  5.  Hypotension.  We will maintain the patient on albumin.  He also has an order for as needed norepinephrine during dialysis treatment.   LOS: 0 Waylin Dorko 1/27/20218:24 AM

## 2020-01-11 NOTE — Consult Note (Signed)
Ref: Jean Rosenthal, MD   Subjective:  F/U pericardial effusion. Resting comfortably.  Objective:  Vital Signs in the last 24 hours:    Physical Exam: BP Readings from Last 1 Encounters:  01/09/20 104/88     Wt Readings from Last 1 Encounters:  12/22/19 68.6 kg    Weight change:  There is no height or weight on file to calculate BMI. HEENT: [place]/AT, Eyes-Brown, Conjunctiva-Pink, Sclera-Non-icteric Neck: No JVD, No bruit, Trachea midline. Lungs:  Clearing, Bilateral. Cardiac:  Irregular rhythm, normal S1 and S2, no S3. II/VI systolic murmur. Abdomen:  Soft, non-tender. BS present. Extremities:  No edema present. No cyanosis. No clubbing. Bilateral feet gangrene. CNS: AxOx3, Cranial nerves grossly intact, moves all 4 extremities.  Skin: dry.   Intake/Output from previous day: No intake/output data recorded.    Lab Results: BMET    Component Value Date/Time   NA 131 (L) 01/11/2020 0553   NA 135 01/10/2020 0547   NA 133 (L) 01/09/2020 0536   K 3.4 (L) 01/11/2020 0553   K 4.1 01/10/2020 0547   K 3.8 01/09/2020 0536   CL 94 (L) 01/11/2020 0553   CL 98 01/10/2020 0547   CL 91 (L) 01/09/2020 0536   CO2 22 01/11/2020 0553   CO2 21 (L) 01/10/2020 0547   CO2 24 01/09/2020 0536   GLUCOSE 83 01/11/2020 0553   GLUCOSE 79 01/10/2020 0547   GLUCOSE 88 01/09/2020 0536   BUN 45 (H) 01/11/2020 0553   BUN 47 (H) 01/10/2020 0547   BUN 101 (H) 01/09/2020 0536   CREATININE 4.85 (H) 01/11/2020 0553   CREATININE 4.82 (H) 01/10/2020 0547   CREATININE 6.97 (H) 01/09/2020 0536   CALCIUM 9.0 01/11/2020 0553   CALCIUM 9.3 01/10/2020 0547   CALCIUM 9.3 01/09/2020 0536   CALCIUM 8.6 (L) 12/23/2019 0323   CALCIUM 8.2 (L) 10/21/2010 1420   CALCIUM 8.7 06/24/2009 0542   GFRNONAA 13 (L) 01/11/2020 0553   GFRNONAA 13 (L) 01/10/2020 0547   GFRNONAA 8 (L) 01/09/2020 0536   GFRAA 15 (L) 01/11/2020 0553   GFRAA 15 (L) 01/10/2020 0547   GFRAA 10 (L) 01/09/2020 0536   CBC    Component Value  Date/Time   WBC 4.0 01/11/2020 0553   RBC 3.38 (L) 01/11/2020 0553   HGB 9.3 (L) 01/11/2020 0553   HGB 12.3 (L) 12/31/2017 1012   HCT 29.8 (L) 01/11/2020 0553   HCT 37.4 (L) 12/31/2017 1012   PLT 196 01/11/2020 0553   PLT 176 12/31/2017 1012   MCV 88.2 01/11/2020 0553   MCV 99 (H) 12/31/2017 1012   MCH 27.5 01/11/2020 0553   MCHC 31.2 01/11/2020 0553   RDW 16.5 (H) 01/11/2020 0553   RDW 18.8 (H) 12/31/2017 1012   LYMPHSABS 1.1 12/17/2019 1504   LYMPHSABS 1.0 12/31/2017 1012   MONOABS 1.7 (H) 12/17/2019 1504   EOSABS 0.5 12/17/2019 1504   EOSABS 0.1 12/31/2017 1012   BASOSABS 0.1 12/17/2019 1504   BASOSABS 0.0 12/31/2017 1012   HEPATIC Function Panel Recent Labs    12/17/19 0017 12/19/19 0446 12/24/19 0539  PROT 8.1 7.4 7.6   HEMOGLOBIN A1C No components found for: HGA1C,  MPG CARDIAC ENZYMES Lab Results  Component Value Date   CKTOTAL 520 (H) 07/16/2012   CKMB 10.7 (HH) 07/16/2012   TROPONINI 0.22 (HH) 11/13/2017   TROPONINI 0.19 (HH) 11/12/2017   TROPONINI 0.26 (HH) 05/19/2017   BNP No results for input(s): PROBNP in the last 8760 hours. TSH Recent Labs  11/16/19 1730 12/25/19 0350  TSH 7.112* 6.933*   CHOLESTEROL No results for input(s): CHOL in the last 8760 hours.  Scheduled Meds: Continuous Infusions: PRN Meds:.  Assessment/Plan: Moderate pericardial effusion, stable Acute on chronic respiratory failure with hypoxemia Moderate LV systolic dysfunction ESRD Chronic atrial fibrillation H/O PE Bilateral feet gangrene S/P cardiac arrest x 2 NSVT Anemia of chronic disease.  Limited echocardiogram for pericardial effusion ordered.   LOS: 0 days   Time spent including chart review, lab review, examination, discussion with patient's treating doctor : 30 min   Dixie Dials  MD  01/11/2020, 4:07 PM

## 2020-01-12 ENCOUNTER — Other Ambulatory Visit (HOSPITAL_COMMUNITY): Payer: Medicare Other

## 2020-01-12 DIAGNOSIS — J9690 Respiratory failure, unspecified, unspecified whether with hypoxia or hypercapnia: Secondary | ICD-10-CM | POA: Diagnosis not present

## 2020-01-12 DIAGNOSIS — I5040 Unspecified combined systolic (congestive) and diastolic (congestive) heart failure: Secondary | ICD-10-CM | POA: Diagnosis not present

## 2020-01-12 DIAGNOSIS — Z9911 Dependence on respirator [ventilator] status: Secondary | ICD-10-CM | POA: Diagnosis not present

## 2020-01-12 DIAGNOSIS — J9621 Acute and chronic respiratory failure with hypoxia: Secondary | ICD-10-CM | POA: Diagnosis not present

## 2020-01-12 DIAGNOSIS — I5043 Acute on chronic combined systolic (congestive) and diastolic (congestive) heart failure: Secondary | ICD-10-CM | POA: Diagnosis not present

## 2020-01-12 DIAGNOSIS — N186 End stage renal disease: Secondary | ICD-10-CM | POA: Diagnosis not present

## 2020-01-12 DIAGNOSIS — I2699 Other pulmonary embolism without acute cor pulmonale: Secondary | ICD-10-CM | POA: Diagnosis not present

## 2020-01-12 DIAGNOSIS — J90 Pleural effusion, not elsewhere classified: Secondary | ICD-10-CM | POA: Diagnosis not present

## 2020-01-12 DIAGNOSIS — I482 Chronic atrial fibrillation, unspecified: Secondary | ICD-10-CM | POA: Diagnosis not present

## 2020-01-12 DIAGNOSIS — Z992 Dependence on renal dialysis: Secondary | ICD-10-CM | POA: Diagnosis not present

## 2020-01-12 DIAGNOSIS — R221 Localized swelling, mass and lump, neck: Secondary | ICD-10-CM | POA: Diagnosis not present

## 2020-01-12 HISTORY — PX: IR THORACENTESIS ASP PLEURAL SPACE W/IMG GUIDE: IMG5380

## 2020-01-12 MED ORDER — IOHEXOL 300 MG/ML  SOLN
75.0000 mL | Freq: Once | INTRAMUSCULAR | Status: AC | PRN
  Administered 2020-01-12: 75 mL via INTRAVENOUS

## 2020-01-12 MED ORDER — LIDOCAINE HCL 1 % IJ SOLN
INTRAMUSCULAR | Status: DC | PRN
  Administered 2020-01-12: 10 mL

## 2020-01-12 MED ORDER — LIDOCAINE HCL 1 % IJ SOLN
INTRAMUSCULAR | Status: AC
  Filled 2020-01-12: qty 20

## 2020-01-12 NOTE — Progress Notes (Signed)
[facility] [place]   PULMONARY CRITICAL CARE SERVICE  PROGRESS NOTE  Date of Service: 01/12/2020  [name] [name]  IRS:[id number]  DOB: May 19, 1966   DOA: 12/24/2019  Referring Physician: Merton Border, MD  HPI: [name] [name] is a 54 y.o. male seen for follow up of Acute on Chronic Respiratory Failure.  Patient is pressure support mode has been on 28% FiO2 right now is comfortable without distress at this time the goal for the wean today is 16 hours  Medications: Reviewed on Rounds  Physical Exam:  Vitals: Temperature 96.4 pulse 87 respiratory rate 20 blood pressure is 134/92 saturations 100%  Ventilator Settings mode ventilation pressure support FiO2 28% pressure of 14 PEEP 5 tidal volume 494  . General: Comfortable at this time . Eyes: Grossly normal lids, irises & conjunctiva . ENT: grossly tongue is normal . Neck: no obvious mass . Cardiovascular: S1 S2 normal no gallop . Respiratory: No rhonchi no rales are noted at this time . Abdomen: soft . Skin: no rash seen on limited exam . Musculoskeletal: not rigid . Psychiatric:unable to assess . Neurologic: no seizure no involuntary movements         Lab Data:   Basic Metabolic Panel: Recent Labs  Lab 01/06/20 0512 01/09/20 0536 01/10/20 0547 01/11/20 0553 01/11/20 2136  NA 133* 133* 135 131*  --   K 4.0 3.8 4.1 3.4* 3.8  CL 94* 91* 98 94*  --   CO2 23 24 21* 22  --   GLUCOSE 98 88 79 83  --   BUN 97* 101* 47* 45*  --   CREATININE 5.73* 6.97* 4.82* 4.85*  --   CALCIUM 9.6 9.3 9.3 9.0  --   MG  --   --  1.8  --  1.8  PHOS 2.0* 3.0 2.6 2.4*  --     ABG: No results for input(s): PHART, PCO2ART, PO2ART, HCO3, O2SAT in the last 168 hours.  Liver Function Tests: Recent Labs  Lab 01/06/20 0512 01/09/20 0536 01/10/20 0547 01/11/20 0553  ALBUMIN 3.0* 3.0* 3.1* 2.9*   No results for input(s): LIPASE, AMYLASE in the last 168 hours. No results for input(s): AMMONIA in  the last 168 hours.  CBC: Recent Labs  Lab 01/06/20 0512 01/09/20 0536 01/10/20 0547 01/11/20 0553  WBC 7.0 6.1 5.3 4.0  HGB 8.9* 9.7* 9.6* 9.3*  HCT 29.0* 31.4* 31.4* 29.8*  MCV 90.1 89.5 89.2 88.2  PLT 274 263 229 196    Cardiac Enzymes: No results for input(s): CKTOTAL, CKMB, CKMBINDEX, TROPONINI in the last 168 hours.  BNP (last 3 results) Recent Labs    11/16/19 1356  BNP 962.8*    ProBNP (last 3 results) No results for input(s): PROBNP in the last 8760 hours.  Radiological Exams: DG Neck Soft Tissue  Result Date: 01/10/2020 CLINICAL DATA:  54 year old male with neck swelling.  Tracheostomy. EXAM: NECK SOFT TISSUES - 1+ VIEW COMPARISON:  Portable chest today reported separately. FINDINGS: Portable AP and lateral views of the neck soft tissues. Tracheostomy positioning appears satisfactory. There is a right IJ approach multi lumen central line catheter with no adverse features identified. Visualized tracheal air column is within normal limits. There is nonspecific prevertebral and/or retropharyngeal soft tissue thickening on the lateral, up to 11 millimeters at the C2 level. Other pharyngeal contours appear within normal limits. Negative lung apices.  No acute osseous abnormality identified. IMPRESSION: 1. Abnormal but nonspecific prevertebral/retropharyngeal soft tissue thickening. Neck CT (IV contrast  preferred) would further characterize. 2. Otherwise negative radiographic appearance of the neck. Tracheostomy tube and right IJ central line with no adverse features. Electronically Signed   By: Genevie Ann M.D.   On: 01/10/2020 20:19   DG Abd 1 View  Result Date: 01/10/2020 CLINICAL DATA:  54 year old male with ileus. EXAM: ABDOMEN - 1 VIEW COMPARISON:  CT Abdomen and Pelvis 01/04/2020 and earlier. FINDINGS: Portable AP supine view at 1959 hours. Percutaneous gastrostomy tube has been placed and enteric tube removed from the recent CT. Bowel gas pattern otherwise is stable and  nonobstructed. No acute osseous abnormality identified. Stable abdominal and pelvic visceral contours. Lung bases not included. IMPRESSION: 1. Percutaneous gastrostomy tube placed since the CT on 01/04/2020. 2. Bowel-gas pattern appears stable and within normal limits, not suggestive of ileus at this time. Electronically Signed   By: Genevie Ann M.D.   On: 01/10/2020 20:14   CT SOFT TISSUE NECK W CONTRAST  Result Date: 01/12/2020 CLINICAL DATA:  Initial evaluation for neck swelling, tracheostomy. EXAM: CT NECK WITH CONTRAST TECHNIQUE: Multidetector CT imaging of the neck was performed using the standard protocol following the bolus administration of intravenous contrast. CONTRAST:  61mL OMNIPAQUE IOHEXOL 300 MG/ML  SOLN COMPARISON:  Prior radiograph from 01/10/2020. FINDINGS: Pharynx and larynx: Examination degraded by motion artifact. Oral cavity grossly within normal limits without discrete mass or collection. No acute inflammatory changes seen about the dentition. [place] tonsils grossly within normal limits. No discrete tonsillar or peritonsillar abscess. Nasopharynx grossly within normal limits. There is apparent circumferential mucosal thickening involving the oropharynx and supraglottic larynx, nonspecific, but could reflect sequelae of acute pharyngitis. Hazy inflammatory stranding seen within the adjacent retropharyngeal/prevertebral soft tissues which are somewhat full in appearance, corresponding with abnormality seen on prior radiograph. No frank retropharyngeal collection or abscess. Supraglottic airway is narrowed to 2 mm in diameter at its most narrow point (series 3, image 39). Epiglottis itself grossly within normal limits. Glottis within normal limits. Tracheostomy tube in place within the subglottic trachea. No definite complication or adverse features on this motion degraded exam. Subglottic trachea itself is widely patent. Salivary glands: Salivary glands including the parotid and submandibular  glands are grossly within normal limits. Thyroid: Thyroid grossly within normal limits on this motion degraded exam. Lymph nodes: No definite pathologically enlarged lymph nodes identified within the neck. Multiple prominent supraclavicular nodes noted bilaterally, measuring up to the upper limits of normal at 10 mm, indeterminate. Mildly enlarged right paratracheal node measures 12 mm in short axis. Findings are indeterminate, but could be reactive. Vascular: Right IJ approach central venous catheter in place without complication. Normal intravascular enhancement seen throughout the neck. Vascular calcifications noted about the carotid bifurcations without definite stenosis. Both ICAs partially medialized into the retropharyngeal space. Limited intracranial: Unremarkable. Visualized orbits: Not included on this exam. Mastoids and visualized paranasal sinuses: Visualized maxillary sinuses are clear. Chronic appearing bilateral mastoid effusions partially visualized. Skeleton: No definite acute osseous abnormality seen on this motion degraded exam. No discrete or worrisome osseous lesions. Upper chest: Large layering bilateral pleural effusions, right greater than left, partially visualized. Associated dependent atelectatic changes seen within both lungs. Visualized upper esophagus mildly patulous and gas-filled. Other: None. IMPRESSION: 1. Motion degraded exam. 2. Circumferential mucosal thickening involving the oropharynx and supraglottic larynx, nonspecific, but could reflect sequelae of acute pharyngitis. Hazy inflammatory stranding with fullness within the adjacent retropharyngeal/prevertebral soft tissues corresponding with abnormality seen on prior radiograph. No frank retropharyngeal collection or abscess. Secondary narrowing of the  supraglottic airway to 2 mm in diameter at its most narrow point. Tracheostomy tube in place without complication with wide patency of the subglottic airway. 3. Large layering  bilateral pleural effusions, right greater than left, with associated atelectasis. 4. Mildly enlarged right paratracheal and supraclavicular lymph nodes, indeterminate, but may be reactive. Electronically Signed   By: Jeannine Boga M.D.   On: 01/12/2020 03:41   DG Chest Port 1 View  Result Date: 01/10/2020 CLINICAL DATA:  54 year old male with chest pain and shortness of breath. EXAM: PORTABLE CHEST 1 VIEW COMPARISON:  CT Abdomen and Pelvis 01/04/2020. Portable chest 12/25/2019. Chest CTA 11/16/2019. FINDINGS: Portable AP semi upright view at 1954 hours. Cardiac silhouette appears improved from prior studies where a substantial pericardial effusion was present. Other mediastinal contours are within normal limits. Tracheostomy tube projects over the midline. Right IJ central line appear stable. Enteric feeding tube has been removed. Low lung volumes with dense bibasilar opacity greater on the left. No pneumothorax or pulmonary edema. Paucity of bowel gas in the upper abdomen. Chronic right lateral rib fractures. IMPRESSION: 1. Suspect regressed pericardial effusion since 01/04/2020. 2. Low lung volumes with bilateral lower lung collapse or consolidation. Electronically Signed   By: Genevie Ann M.D.   On: 01/10/2020 20:17    Assessment/Plan Active Problems:   Acute on chronic respiratory failure with hypoxia (HCC)   End stage renal disease on dialysis (HCC)   Acute on chronic systolic and diastolic heart failure, NYHA class 4 (HCC)   Chronic atrial fibrillation ([place])   Acute pulmonary embolism without acute cor pulmonale ([place])   1. Acute on chronic respiratory failure with hypoxia plan is to continue with wean on pressure support patient currently is on 14/5 with a 16-hour goal 2. End-stage renal disease on hemodialysis 3. Acute on chronic systolic heart failure fluid balance needs to be monitored closely 4. Chronic atrial fibrillation rate controlled 5. Acute pulmonary embolism no changes  supportive care   I have personally seen and evaluated the patient, evaluated laboratory and imaging results, formulated the assessment and plan and placed orders. The Patient requires high complexity decision making with multiple systems involvement.  Rounds were done with the Respiratory Therapy Director and Staff therapists and discussed with nursing staff also.  Allyne Gee, MD [facility] Pulmonary Critical Care Medicine Sleep Medicine

## 2020-01-12 NOTE — Procedures (Signed)
PROCEDURE SUMMARY:  Successful US guided right thoracentesis. Yielded 250 mL of amber fluid. Pt tolerated procedure well. No immediate complications.  Specimen was not sent for labs. CXR ordered.  EBL < 5 mL  Docia Barrier PA-C 01/12/2020 4:51 PM

## 2020-01-13 ENCOUNTER — Other Ambulatory Visit (HOSPITAL_COMMUNITY): Payer: Medicare Other

## 2020-01-13 DIAGNOSIS — N186 End stage renal disease: Secondary | ICD-10-CM | POA: Diagnosis not present

## 2020-01-13 DIAGNOSIS — I5043 Acute on chronic combined systolic (congestive) and diastolic (congestive) heart failure: Secondary | ICD-10-CM | POA: Diagnosis not present

## 2020-01-13 DIAGNOSIS — I4821 Permanent atrial fibrillation: Secondary | ICD-10-CM | POA: Diagnosis not present

## 2020-01-13 DIAGNOSIS — I482 Chronic atrial fibrillation, unspecified: Secondary | ICD-10-CM | POA: Diagnosis not present

## 2020-01-13 DIAGNOSIS — I5040 Unspecified combined systolic (congestive) and diastolic (congestive) heart failure: Secondary | ICD-10-CM | POA: Diagnosis not present

## 2020-01-13 DIAGNOSIS — J9690 Respiratory failure, unspecified, unspecified whether with hypoxia or hypercapnia: Secondary | ICD-10-CM | POA: Diagnosis not present

## 2020-01-13 DIAGNOSIS — I959 Hypotension, unspecified: Secondary | ICD-10-CM | POA: Diagnosis not present

## 2020-01-13 DIAGNOSIS — Z9911 Dependence on respirator [ventilator] status: Secondary | ICD-10-CM | POA: Diagnosis not present

## 2020-01-13 DIAGNOSIS — I5021 Acute systolic (congestive) heart failure: Secondary | ICD-10-CM | POA: Diagnosis not present

## 2020-01-13 DIAGNOSIS — I313 Pericardial effusion (noninflammatory): Secondary | ICD-10-CM | POA: Diagnosis not present

## 2020-01-13 DIAGNOSIS — D631 Anemia in chronic kidney disease: Secondary | ICD-10-CM | POA: Diagnosis not present

## 2020-01-13 DIAGNOSIS — I2699 Other pulmonary embolism without acute cor pulmonale: Secondary | ICD-10-CM | POA: Diagnosis not present

## 2020-01-13 DIAGNOSIS — Z992 Dependence on renal dialysis: Secondary | ICD-10-CM | POA: Diagnosis not present

## 2020-01-13 DIAGNOSIS — J9621 Acute and chronic respiratory failure with hypoxia: Secondary | ICD-10-CM | POA: Diagnosis not present

## 2020-01-13 DIAGNOSIS — N2581 Secondary hyperparathyroidism of renal origin: Secondary | ICD-10-CM | POA: Diagnosis not present

## 2020-01-13 LAB — CBC
HCT: 33 % — ABNORMAL LOW (ref 39.0–52.0)
Hemoglobin: 10.1 g/dL — ABNORMAL LOW (ref 13.0–17.0)
MCH: 27.2 pg (ref 26.0–34.0)
MCHC: 30.6 g/dL (ref 30.0–36.0)
MCV: 88.7 fL (ref 80.0–100.0)
Platelets: 228 10*3/uL (ref 150–400)
RBC: 3.72 MIL/uL — ABNORMAL LOW (ref 4.22–5.81)
RDW: 16.4 % — ABNORMAL HIGH (ref 11.5–15.5)
WBC: 3.6 10*3/uL — ABNORMAL LOW (ref 4.0–10.5)
nRBC: 0 % (ref 0.0–0.2)

## 2020-01-13 LAB — RENAL FUNCTION PANEL
Albumin: 3 g/dL — ABNORMAL LOW (ref 3.5–5.0)
Anion gap: 14 (ref 5–15)
BUN: 53 mg/dL — ABNORMAL HIGH (ref 6–20)
CO2: 24 mmol/L (ref 22–32)
Calcium: 8.9 mg/dL (ref 8.9–10.3)
Chloride: 93 mmol/L — ABNORMAL LOW (ref 98–111)
Creatinine, Ser: 5.42 mg/dL — ABNORMAL HIGH (ref 0.61–1.24)
GFR calc Af Amer: 13 mL/min — ABNORMAL LOW (ref >60)
GFR calc non Af Amer: 11 mL/min — ABNORMAL LOW (ref >60)
Glucose, Bld: 158 mg/dL — ABNORMAL HIGH (ref 70–99)
Phosphorus: 4.4 mg/dL (ref 2.5–4.6)
Potassium: 4.7 mmol/L (ref 3.5–5.1)
Sodium: 131 mmol/L — ABNORMAL LOW (ref 135–145)

## 2020-01-13 LAB — MAGNESIUM: Magnesium: 1.9 mg/dL (ref 1.7–2.4)

## 2020-01-13 NOTE — Progress Notes (Signed)
[facility]  ROUNDING NOTE   Subjective:  Patient seen and evaluated during dialysis treatment. Tolerating well. Currently on Levophed however nursing instructed to wean this off. Still on the ventilator.  Objective:  Vital signs in last 24 hours:  Temperature 96.4 pulse 80 respirations 22 blood pressure 110/80  Physical Exam: General: Chronically ill-appearing  Head: Normocephalic, atraumatic. Moist oral mucosal membranes  Eyes: Anicteric  Neck: Tracheostomy present, neck swelling noted  Lungs:  Scattered rhonchi  Heart: S1S2 no rubs  Abdomen:  Soft, nontender, bowel sounds present, distended  Extremities: Trace peripheral edema.  Neurologic: Awake, alert, following commands  Skin: Right and left toes covered  Access: Right IJ temporary dialysis catheter    Basic Metabolic Panel: Recent Labs  Lab 01/09/20 0536 01/10/20 0547 01/11/20 0553 01/11/20 2136  NA 133* 135 131*  --   K 3.8 4.1 3.4* 3.8  CL 91* 98 94*  --   CO2 24 21* 22  --   GLUCOSE 88 79 83  --   BUN 101* 47* 45*  --   CREATININE 6.97* 4.82* 4.85*  --   CALCIUM 9.3 9.3 9.0  --   MG  --  1.8  --  1.8  PHOS 3.0 2.6 2.4*  --     Liver Function Tests: Recent Labs  Lab 01/09/20 0536 01/10/20 0547 01/11/20 0553  ALBUMIN 3.0* 3.1* 2.9*   No results for input(s): LIPASE, AMYLASE in the last 168 hours. No results for input(s): AMMONIA in the last 168 hours.  CBC: Recent Labs  Lab 01/09/20 0536 01/10/20 0547 01/11/20 0553 01/13/20 0742  WBC 6.1 5.3 4.0 3.6*  HGB 9.7* 9.6* 9.3* 10.1*  HCT 31.4* 31.4* 29.8* 33.0*  MCV 89.5 89.2 88.2 88.7  PLT 263 229 196 228    Cardiac Enzymes: No results for input(s): CKTOTAL, CKMB, CKMBINDEX, TROPONINI in the last 168 hours.  BNP: Invalid input(s): POCBNP  CBG: No results for input(s): GLUCAP in the last 168 hours.  Microbiology: Results for orders placed or performed during the hospital encounter of 11/16/19  SARS Coronavirus 2 by RT  PCR (hospital order, performed in [facility] hospital lab) Nasopharyngeal Nasopharyngeal Swab     Status: None   Collection Time: 11/16/19 11:41 AM   Specimen: Nasopharyngeal Swab  Result Value Ref Range Status   SARS Coronavirus 2 NEGATIVE NEGATIVE Final    Comment: (NOTE) SARS-CoV-2 target nucleic acids are NOT DETECTED. The SARS-CoV-2 RNA is generally detectable in upper and lower respiratory specimens during the acute phase of infection. The lowest concentration of SARS-CoV-2 viral copies this assay can detect is 250 copies / mL. A negative result does not preclude SARS-CoV-2 infection and should not be used as the sole basis for treatment or other patient management decisions.  A negative result may occur with improper specimen collection / handling, submission of specimen other than nasopharyngeal swab, presence of viral mutation(s) within the areas targeted by this assay, and inadequate number of viral copies (<250 copies / mL). A negative result must be combined with clinical observations, patient history, and epidemiological information. Fact Sheet for Patients:   StrictlyIdeas.no Fact Sheet for Healthcare Providers: BankingDealers.co.za This test is not yet approved or cleared  by the [place] FDA and has been authorized for detection and/or diagnosis of SARS-CoV-2 by FDA under an Emergency Use Authorization (EUA).  This EUA will remain in effect (meaning this test can be used) for the duration of the COVID-19 declaration under Section 564(b)(1) of the  Act, 21 U.S.C. section 360bbb-3(b)(1), unless the authorization is terminated or revoked sooner. Performed at [facility], [place] [address] [place], [place] [address]   Culture, blood (routine x 2)     Status: None   Collection Time: 11/16/19  6:03 PM   Specimen: BLOOD RIGHT HAND  Result Value Ref Range Status   Specimen Description BLOOD RIGHT HAND  Final    Special Requests   Final    BOTTLES DRAWN AEROBIC AND ANAEROBIC Blood Culture results may not be optimal due to an inadequate volume of blood received in culture bottles   Culture   Final    NO GROWTH 5 DAYS Performed at [facility], [place] [address] [place], [place] [address]    Report Status 11/21/2019 FINAL  Final  MRSA PCR Screening     Status: None   Collection Time: 11/16/19  6:05 PM   Specimen: Nasal Mucosa; Nasopharyngeal  Result Value Ref Range Status   MRSA by PCR NEGATIVE NEGATIVE Final    Comment:        The GeneXpert MRSA Assay (FDA approved for NASAL specimens only), is one component of a comprehensive MRSA colonization surveillance program. It is not intended to diagnose MRSA infection nor to guide or monitor treatment for MRSA infections. Performed at [facility], [place] [address] [place], [place] [address]   Culture, blood (routine x 2)     Status: None   Collection Time: 11/16/19  6:08 PM   Specimen: BLOOD RIGHT HAND  Result Value Ref Range Status   Specimen Description BLOOD RIGHT HAND  Final   Special Requests   Final    BOTTLES DRAWN AEROBIC ONLY Blood Culture results may not be optimal due to an inadequate volume of blood received in culture bottles   Culture   Final    NO GROWTH 5 DAYS Performed at [facility], [place] [address] [place], [place] [address]    Report Status 11/21/2019 FINAL  Final  Culture, respiratory (non-expectorated)     Status: None   Collection Time: 11/18/19 12:28 PM   Specimen: Tracheal Aspirate; Respiratory  Result Value Ref Range Status   Specimen Description TRACHEAL ASPIRATE  Final   Special Requests NONE  Final   Gram Stain   Final    ABUNDANT WBC PRESENT,BOTH PMN AND MONONUCLEAR ABUNDANT GRAM POSITIVE COCCI ABUNDANT GRAM NEGATIVE RODS FEW GRAM VARIABLE ROD    Culture   Final    ABUNDANT HAEMOPHILUS INFLUENZAE BETA LACTAMASE POSITIVE Performed at [facility], [place] [address], [place],  [place] [address]    Report Status 11/20/2019 FINAL  Final  Surgical pcr screen     Status: Abnormal   Collection Time: 11/24/19  7:53 AM   Specimen: Nasal Mucosa; Nasal Swab  Result Value Ref Range Status   MRSA, PCR NEGATIVE NEGATIVE Final   Staphylococcus aureus POSITIVE (A) NEGATIVE Final    Comment: (NOTE) The Xpert SA Assay (FDA approved for NASAL specimens in patients 16 years of age and older), is one component of a comprehensive surveillance program. It is not intended to diagnose infection nor to guide or monitor treatment. Performed at [facility], [place] [address], [place], [place] [address]   Culture, blood (routine x 2)     Status: None   Collection Time: 11/25/19  3:30 PM   Specimen: BLOOD  Result Value Ref Range Status   Specimen Description BLOOD RIGHT ANTECUBITAL  Final  Special Requests   Final    BOTTLES DRAWN AEROBIC ONLY Blood Culture results may not be optimal due to an inadequate volume of blood received in culture bottles   Culture   Final    NO GROWTH 5 DAYS Performed at [facility], [place] [address], [place], [place] [address]    Report Status 11/30/2019 FINAL  Final  Culture, blood (routine x 2)     Status: None   Collection Time: 11/25/19  3:30 PM   Specimen: BLOOD RIGHT HAND  Result Value Ref Range Status   Specimen Description BLOOD RIGHT HAND  Final   Special Requests   Final    BOTTLES DRAWN AEROBIC AND ANAEROBIC Blood Culture results may not be optimal due to an inadequate volume of blood received in culture bottles   Culture   Final    NO GROWTH 5 DAYS Performed at [facility], [place] [address], [place], [place] [address]    Report Status 11/30/2019 FINAL  Final  Culture, respiratory (non-expectorated)     Status: None   Collection Time: 11/25/19  4:35 PM   Specimen: Tracheal Aspirate; Respiratory  Result Value Ref Range Status   Specimen Description TRACHEAL ASPIRATE  Final   Special Requests NONE  Final   Gram Stain   Final     ABUNDANT WBC PRESENT,BOTH PMN AND MONONUCLEAR FEW GRAM POSITIVE COCCI Performed at [facility], [place] [address] [place], [place] [address]    Culture FEW STAPHYLOCOCCUS EPIDERMIDIS  Final   Report Status 11/28/2019 FINAL  Final   Organism ID, Bacteria STAPHYLOCOCCUS EPIDERMIDIS  Final      Susceptibility   Staphylococcus epidermidis - MIC*    CIPROFLOXACIN <=0.5 SENSITIVE Sensitive     ERYTHROMYCIN >=8 RESISTANT Resistant     GENTAMICIN <=0.5 SENSITIVE Sensitive     OXACILLIN >=4 RESISTANT Resistant     TETRACYCLINE <=1 SENSITIVE Sensitive     VANCOMYCIN 2 SENSITIVE Sensitive     TRIMETH/SULFA <=10 SENSITIVE Sensitive     CLINDAMYCIN >=8 RESISTANT Resistant     RIFAMPIN <=0.5 SENSITIVE Sensitive     Inducible Clindamycin NEGATIVE Sensitive     * FEW STAPHYLOCOCCUS EPIDERMIDIS  Culture, respiratory (non-expectorated)     Status: None   Collection Time: 12/15/19  4:12 PM   Specimen: Tracheal Aspirate; Respiratory  Result Value Ref Range Status   Specimen Description TRACHEAL ASPIRATE  Final   Special Requests NONE  Final   Gram Stain   Final    MODERATE WBC PRESENT, PREDOMINANTLY PMN MODERATE GRAM POSITIVE COCCI IN PAIRS IN CLUSTERS Performed at [facility], [address] [place], [place] [address]    Culture MODERATE STAPHYLOCOCCUS AUREUS  Final   Report Status 12/17/2019 FINAL  Final   Organism ID, Bacteria STAPHYLOCOCCUS AUREUS  Final      Susceptibility   Staphylococcus aureus - MIC*    CIPROFLOXACIN <=0.5 SENSITIVE Sensitive     ERYTHROMYCIN <=0.25 SENSITIVE Sensitive     GENTAMICIN <=0.5 SENSITIVE Sensitive     OXACILLIN <=0.25 SENSITIVE Sensitive     TETRACYCLINE <=1 SENSITIVE Sensitive     VANCOMYCIN 1 SENSITIVE Sensitive     TRIMETH/SULFA <=10 SENSITIVE Sensitive     CLINDAMYCIN <=0.25 SENSITIVE Sensitive     RIFAMPIN <=0.5 SENSITIVE Sensitive     Inducible Clindamycin NEGATIVE Sensitive     * MODERATE STAPHYLOCOCCUS AUREUS  MRSA PCR Screening      Status: None   Collection Time: 12/17/19  10:25 AM   Specimen: Nasopharyngeal  Result Value Ref Range Status   MRSA by PCR NEGATIVE NEGATIVE Final    Comment:        The GeneXpert MRSA Assay (FDA approved for NASAL specimens only), is one component of a comprehensive MRSA colonization surveillance program. It is not intended to diagnose MRSA infection nor to guide or monitor treatment for MRSA infections. Performed at [facility], [place] [address], [place], [place] [address]   Culture, blood (routine x 2)     Status: None   Collection Time: 12/17/19  3:15 PM   Specimen: BLOOD  Result Value Ref Range Status   Specimen Description BLOOD RIGHT ANTECUBITAL  Final   Special Requests   Final    BOTTLES DRAWN AEROBIC AND ANAEROBIC Blood Culture results may not be optimal due to an inadequate volume of blood received in culture bottles   Culture   Final    NO GROWTH 5 DAYS Performed at [facility], [place] [address], [place], [place] [address]    Report Status 12/22/2019 FINAL  Final  Culture, blood (routine x 2)     Status: None   Collection Time: 12/17/19  3:20 PM   Specimen: BLOOD RIGHT HAND  Result Value Ref Range Status   Specimen Description BLOOD RIGHT HAND  Final   Special Requests   Final    BOTTLES DRAWN AEROBIC ONLY Blood Culture results may not be optimal due to an inadequate volume of blood received in culture bottles   Culture   Final    NO GROWTH 5 DAYS Performed at [facility], [place] [address] [place],  [address]    Report Status 12/22/2019 FINAL  Final    Coagulation Studies: No results for input(s): LABPROT, INR in the last 72 hours.  Urinalysis: No results for input(s): COLORURINE, LABSPEC, PHURINE, GLUCOSEU, HGBUR, BILIRUBINUR, KETONESUR, PROTEINUR, UROBILINOGEN, NITRITE, LEUKOCYTESUR in the last 72 hours.  Invalid input(s): APPERANCEUR    Imaging: DG Chest 1 View  Result Date: 01/12/2020 CLINICAL DATA:  Post thoracentesis  RIGHT side. EXAM: CHEST  1 VIEW COMPARISON:  Radiograph 01/10/2020 FINDINGS: Central venous line with tip in the mid SVC. There are bilateral moderate effusions not changed from prior. No Pneumothorax evident following thoracentesis. Metallic bar midline over the radiograph. Cardiomegaly. Central venous congestion. IMPRESSION: 1. No pneumothorax following thoracentesis. 2. Bilateral small to moderate effusions. Electronically Signed   By: Suzy Bouchard M.D.   On: 01/12/2020 16:23   CT SOFT TISSUE NECK W CONTRAST  Result Date: 01/12/2020 CLINICAL DATA:  Initial evaluation for neck swelling, tracheostomy. EXAM: CT NECK WITH CONTRAST TECHNIQUE: Multidetector CT imaging of the neck was performed using the standard protocol following the bolus administration of intravenous contrast. CONTRAST:  18mL OMNIPAQUE IOHEXOL 300 MG/ML  SOLN COMPARISON:  Prior radiograph from 01/10/2020. FINDINGS: Pharynx and larynx: Examination degraded by motion artifact. Oral cavity grossly within normal limits without discrete mass or collection. No acute inflammatory changes seen about the dentition. [place]atine tonsils grossly within normal limits. No discrete tonsillar or peritonsillar abscess. Nasopharynx grossly within normal limits. There is apparent circumferential mucosal thickening involving the oropharynx and supraglottic larynx, nonspecific, but could reflect sequelae of acute pharyngitis. Hazy inflammatory stranding seen within the adjacent retropharyngeal/prevertebral soft tissues which are somewhat full in appearance, corresponding with abnormality seen on prior radiograph. No frank retropharyngeal collection or abscess. Supraglottic airway is narrowed to 2 mm in diameter at its most narrow point (series 3, image  39). Epiglottis itself grossly within normal limits. Glottis within normal limits. Tracheostomy tube in place within the subglottic trachea. No definite complication or adverse features on this motion degraded exam.  Subglottic trachea itself is widely patent. Salivary glands: Salivary glands including the parotid and submandibular glands are grossly within normal limits. Thyroid: Thyroid grossly within normal limits on this motion degraded exam. Lymph nodes: No definite pathologically enlarged lymph nodes identified within the neck. Multiple prominent supraclavicular nodes noted bilaterally, measuring up to the upper limits of normal at 10 mm, indeterminate. Mildly enlarged right paratracheal node measures 12 mm in short axis. Findings are indeterminate, but could be reactive. Vascular: Right IJ approach central venous catheter in place without complication. Normal intravascular enhancement seen throughout the neck. Vascular calcifications noted about the carotid bifurcations without definite stenosis. Both ICAs partially medialized into the retropharyngeal space. Limited intracranial: Unremarkable. Visualized orbits: Not included on this exam. Mastoids and visualized paranasal sinuses: Visualized maxillary sinuses are clear. Chronic appearing bilateral mastoid effusions partially visualized. Skeleton: No definite acute osseous abnormality seen on this motion degraded exam. No discrete or worrisome osseous lesions. Upper chest: Large layering bilateral pleural effusions, right greater than left, partially visualized. Associated dependent atelectatic changes seen within both lungs. Visualized upper esophagus mildly patulous and gas-filled. Other: None. IMPRESSION: 1. Motion degraded exam. 2. Circumferential mucosal thickening involving the oropharynx and supraglottic larynx, nonspecific, but could reflect sequelae of acute pharyngitis. Hazy inflammatory stranding with fullness within the adjacent retropharyngeal/prevertebral soft tissues corresponding with abnormality seen on prior radiograph. No frank retropharyngeal collection or abscess. Secondary narrowing of the supraglottic airway to 2 mm in diameter at its most narrow  point. Tracheostomy tube in place without complication with wide patency of the subglottic airway. 3. Large layering bilateral pleural effusions, right greater than left, with associated atelectasis. 4. Mildly enlarged right paratracheal and supraclavicular lymph nodes, indeterminate, but may be reactive. Electronically Signed   By: Jeannine Boga M.D.   On: 01/12/2020 03:41   IR THORACENTESIS ASP PLEURAL SPACE W/IMG GUIDE  Result Date: 01/12/2020 INDICATION: Patient with history of cardiac arrest, respiratory failure now with bilateral pleural effusions. Request is made for therapeutic thoracentesis. EXAM: ULTRASOUND GUIDED THERAPEUTIC RIGHT THORACENTESIS MEDICATIONS: 10 mL 1% lidocaine COMPLICATIONS: None immediate. PROCEDURE: An ultrasound guided thoracentesis was thoroughly discussed with the patient and questions answered. The benefits, risks, alternatives and complications were also discussed. The patient understands and wishes to proceed with the procedure. Written consent was obtained. Ultrasound was performed to localize and mark a small but adequate pocket of fluid in the right chest. The area was then prepped and draped in the normal sterile fashion. 1% Lidocaine was used for local anesthesia. Under ultrasound guidance a 6 Fr Safe-T-Centesis catheter was introduced. Thoracentesis was performed. The catheter was removed and a dressing applied. FINDINGS: A total of approximately 250 mL of amber fluid was removed. Samples were sent to the laboratory as requested by the clinical team. IMPRESSION: Successful ultrasound guided therapeutic right thoracentesis yielding 250 mL of pleural fluid. Read by: Brynda Greathouse PA-C Electronically Signed   By: Markus Daft M.D.   On: 01/12/2020 16:56     Medications:       Assessment/ Plan:  54 y.o. male with a PMHx of recent cardiac arrest status post hypothermia protocol, ESRD on HD MWF, anemia of chronic kidney disease, secondary hyperparathyroidism,  history of polysubstance abuse, hypertension,, chronic systolic heart failure, medical nonadherence who was admitted to [facility] on 12/24/2019 for ongoing management of  acute respiratory failure.  1.  ESRD on HD MWF.  Patient seen and evaluated during dialysis treatment.  Currently on norepinephrine however we have instructed nursing to discontinue this.  2.  Anemia of chronic kidney disease.  Hemoglobin currently 10.1.  Maintain the patient on Retacrit.  3.  Secondary hyperparathyroidism.  Repeat serum phosphorus today.  4.  Hyperkalemia.  Resolved.  5.  Hypotension.  Patient on albumin as well as norepinephrine.  We have instructed to wean norepinephrine today during the dialysis treatment.   LOS: 0 [name] [name] 1/29/20218:54 AM

## 2020-01-13 NOTE — Progress Notes (Signed)
  Echocardiogram 2D Echocardiogram has been performed.  [name] [name] 01/13/2020, 5:52 PM

## 2020-01-13 NOTE — Progress Notes (Signed)
Pulmonary [place]   PULMONARY CRITICAL CARE SERVICE  PROGRESS NOTE  Date of Service: 01/13/2020  [name] [name]  TMH:[id number]  DOB: 01-09-1966   DOA: 12/24/2019  Referring Physician: Merton Border, MD  HPI: [name] [name] is a 54 y.o. male seen for follow up of Acute on Chronic Respiratory Failure.  Patient is on full support on pressure support mode at this time is supposed to do T collar was being dialyzed this morning so will be started on T collar after dialysis  Medications: Reviewed on Rounds  Physical Exam:  Vitals: Temperature 96.4 pulse 80 respiratory 22 blood pressure is 110/80 saturations 100%  Ventilator Settings mode ventilation pressure support FiO2 28% pressure support 12 PEEP 5  . General: Comfortable at this time . Eyes: Grossly normal lids, irises & conjunctiva . ENT: grossly tongue is normal . Neck: no obvious mass . Cardiovascular: S1 S2 normal no gallop . Respiratory: Scattered rhonchi expansion is equal at this time . Abdomen: soft . Skin: no rash seen on limited exam . Musculoskeletal: not rigid . Psychiatric:unable to assess . Neurologic: no seizure no involuntary movements         Lab Data:   Basic Metabolic Panel: Recent Labs  Lab 01/09/20 0536 01/10/20 0547 01/11/20 0553 01/11/20 2136 01/13/20 0742  NA 133* 135 131*  --  131*  K 3.8 4.1 3.4* 3.8 4.7  CL 91* 98 94*  --  93*  CO2 24 21* 22  --  24  GLUCOSE 88 79 83  --  158*  BUN 101* 47* 45*  --  53*  CREATININE 6.97* 4.82* 4.85*  --  5.42*  CALCIUM 9.3 9.3 9.0  --  8.9  MG  --  1.8  --  1.8  --   PHOS 3.0 2.6 2.4*  --  4.4    ABG: No results for input(s): PHART, PCO2ART, PO2ART, HCO3, O2SAT in the last 168 hours.  Liver Function Tests: Recent Labs  Lab 01/09/20 0536 01/10/20 0547 01/11/20 0553 01/13/20 0742  ALBUMIN 3.0* 3.1* 2.9* 3.0*   No results for input(s): LIPASE, AMYLASE in the last 168 hours. No results for input(s):  AMMONIA in the last 168 hours.  CBC: Recent Labs  Lab 01/09/20 0536 01/10/20 0547 01/11/20 0553 01/13/20 0742  WBC 6.1 5.3 4.0 3.6*  HGB 9.7* 9.6* 9.3* 10.1*  HCT 31.4* 31.4* 29.8* 33.0*  MCV 89.5 89.2 88.2 88.7  PLT 263 229 196 228    Cardiac Enzymes: No results for input(s): CKTOTAL, CKMB, CKMBINDEX, TROPONINI in the last 168 hours.  BNP (last 3 results) Recent Labs    11/16/19 1356  BNP 962.8*    ProBNP (last 3 results) No results for input(s): PROBNP in the last 8760 hours.  Radiological Exams: DG Chest 1 View  Result Date: 01/12/2020 CLINICAL DATA:  Post thoracentesis RIGHT side. EXAM: CHEST  1 VIEW COMPARISON:  Radiograph 01/10/2020 FINDINGS: Central venous line with tip in the mid SVC. There are bilateral moderate effusions not changed from prior. No Pneumothorax evident following thoracentesis. Metallic bar midline over the radiograph. Cardiomegaly. Central venous congestion. IMPRESSION: 1. No pneumothorax following thoracentesis. 2. Bilateral small to moderate effusions. Electronically Signed   By: Suzy Bouchard M.D.   On: 01/12/2020 16:23   CT SOFT TISSUE NECK W CONTRAST  Result Date: 01/12/2020 CLINICAL DATA:  Initial evaluation for neck swelling, tracheostomy. EXAM: CT NECK WITH CONTRAST TECHNIQUE: Multidetector CT imaging of the neck was performed using the  standard protocol following the bolus administration of intravenous contrast. CONTRAST:  58mL OMNIPAQUE IOHEXOL 300 MG/ML  SOLN COMPARISON:  Prior radiograph from 01/10/2020. FINDINGS: Pharynx and larynx: Examination degraded by motion artifact. Oral cavity grossly within normal limits without discrete mass or collection. No acute inflammatory changes seen about the dentition. [place] tonsils grossly within normal limits. No discrete tonsillar or peritonsillar abscess. Nasopharynx grossly within normal limits. There is apparent circumferential mucosal thickening involving the oropharynx and supraglottic larynx,  nonspecific, but could reflect sequelae of acute pharyngitis. Hazy inflammatory stranding seen within the adjacent retropharyngeal/prevertebral soft tissues which are somewhat full in appearance, corresponding with abnormality seen on prior radiograph. No frank retropharyngeal collection or abscess. Supraglottic airway is narrowed to 2 mm in diameter at its most narrow point (series 3, image 39). Epiglottis itself grossly within normal limits. Glottis within normal limits. Tracheostomy tube in place within the subglottic trachea. No definite complication or adverse features on this motion degraded exam. Subglottic trachea itself is widely patent. Salivary glands: Salivary glands including the parotid and submandibular glands are grossly within normal limits. Thyroid: Thyroid grossly within normal limits on this motion degraded exam. Lymph nodes: No definite pathologically enlarged lymph nodes identified within the neck. Multiple prominent supraclavicular nodes noted bilaterally, measuring up to the upper limits of normal at 10 mm, indeterminate. Mildly enlarged right paratracheal node measures 12 mm in short axis. Findings are indeterminate, but could be reactive. Vascular: Right IJ approach central venous catheter in place without complication. Normal intravascular enhancement seen throughout the neck. Vascular calcifications noted about the carotid bifurcations without definite stenosis. Both ICAs partially medialized into the retropharyngeal space. Limited intracranial: Unremarkable. Visualized orbits: Not included on this exam. Mastoids and visualized paranasal sinuses: Visualized maxillary sinuses are clear. Chronic appearing bilateral mastoid effusions partially visualized. Skeleton: No definite acute osseous abnormality seen on this motion degraded exam. No discrete or worrisome osseous lesions. Upper chest: Large layering bilateral pleural effusions, right greater than left, partially visualized. Associated  dependent atelectatic changes seen within both lungs. Visualized upper esophagus mildly patulous and gas-filled. Other: None. IMPRESSION: 1. Motion degraded exam. 2. Circumferential mucosal thickening involving the oropharynx and supraglottic larynx, nonspecific, but could reflect sequelae of acute pharyngitis. Hazy inflammatory stranding with fullness within the adjacent retropharyngeal/prevertebral soft tissues corresponding with abnormality seen on prior radiograph. No frank retropharyngeal collection or abscess. Secondary narrowing of the supraglottic airway to 2 mm in diameter at its most narrow point. Tracheostomy tube in place without complication with wide patency of the subglottic airway. 3. Large layering bilateral pleural effusions, right greater than left, with associated atelectasis. 4. Mildly enlarged right paratracheal and supraclavicular lymph nodes, indeterminate, but may be reactive. Electronically Signed   By: Jeannine Boga M.D.   On: 01/12/2020 03:41   IR THORACENTESIS ASP PLEURAL SPACE W/IMG GUIDE  Result Date: 01/12/2020 INDICATION: Patient with history of cardiac arrest, respiratory failure now with bilateral pleural effusions. Request is made for therapeutic thoracentesis. EXAM: ULTRASOUND GUIDED THERAPEUTIC RIGHT THORACENTESIS MEDICATIONS: 10 mL 1% lidocaine COMPLICATIONS: None immediate. PROCEDURE: An ultrasound guided thoracentesis was thoroughly discussed with the patient and questions answered. The benefits, risks, alternatives and complications were also discussed. The patient understands and wishes to proceed with the procedure. Written consent was obtained. Ultrasound was performed to localize and mark a small but adequate pocket of fluid in the right chest. The area was then prepped and draped in the normal sterile fashion. 1% Lidocaine was used for local anesthesia. Under ultrasound guidance a  6 Fr Safe-T-Centesis catheter was introduced. Thoracentesis was performed. The  catheter was removed and a dressing applied. FINDINGS: A total of approximately 250 mL of amber fluid was removed. Samples were sent to the laboratory as requested by the clinical team. IMPRESSION: Successful ultrasound guided therapeutic right thoracentesis yielding 250 mL of pleural fluid. Read by: Brynda Greathouse PA-C Electronically Signed   By: Markus Daft M.D.   On: 01/12/2020 16:56    Assessment/Plan Active Problems:   Acute on chronic respiratory failure with hypoxia (HCC)   End stage renal disease on dialysis (HCC)   Acute on chronic systolic and diastolic heart failure, NYHA class 4 (HCC)   Chronic atrial fibrillation ([place])   Acute pulmonary embolism without acute cor pulmonale ([place])   1. Acute on chronic respiratory failure with hypoxia plan is to continue with pressure support for now and once dialysis is completed we will place on T collar 2. End-stage renal disease on hemodialysis continue with supportive care 3. Acute on chronic diastolic heart failure monitor fluid status 4. Chronic atrial fibrillation rate controlled 5. Acute pulmonary embolism treated continue to monitor   I have personally seen and evaluated the patient, evaluated laboratory and imaging results, formulated the assessment and plan and placed orders. The Patient requires high complexity decision making with multiple systems involvement.  Rounds were done with the Respiratory Therapy Director and Staff therapists and discussed with nursing staff also.  Allyne Gee, MD [facility] Pulmonary Critical Care Medicine Sleep Medicine

## 2020-01-14 DIAGNOSIS — I5043 Acute on chronic combined systolic (congestive) and diastolic (congestive) heart failure: Secondary | ICD-10-CM | POA: Diagnosis not present

## 2020-01-14 DIAGNOSIS — I482 Chronic atrial fibrillation, unspecified: Secondary | ICD-10-CM | POA: Diagnosis not present

## 2020-01-14 DIAGNOSIS — J9690 Respiratory failure, unspecified, unspecified whether with hypoxia or hypercapnia: Secondary | ICD-10-CM | POA: Diagnosis not present

## 2020-01-14 DIAGNOSIS — Z992 Dependence on renal dialysis: Secondary | ICD-10-CM | POA: Diagnosis not present

## 2020-01-14 DIAGNOSIS — I5040 Unspecified combined systolic (congestive) and diastolic (congestive) heart failure: Secondary | ICD-10-CM | POA: Diagnosis not present

## 2020-01-14 DIAGNOSIS — Z9911 Dependence on respirator [ventilator] status: Secondary | ICD-10-CM | POA: Diagnosis not present

## 2020-01-14 DIAGNOSIS — I2699 Other pulmonary embolism without acute cor pulmonale: Secondary | ICD-10-CM | POA: Diagnosis not present

## 2020-01-14 DIAGNOSIS — N186 End stage renal disease: Secondary | ICD-10-CM | POA: Diagnosis not present

## 2020-01-14 DIAGNOSIS — J9621 Acute and chronic respiratory failure with hypoxia: Secondary | ICD-10-CM | POA: Diagnosis not present

## 2020-01-14 LAB — BASIC METABOLIC PANEL
Anion gap: 18 — ABNORMAL HIGH (ref 5–15)
BUN: 53 mg/dL — ABNORMAL HIGH (ref 6–20)
CO2: 24 mmol/L (ref 22–32)
Calcium: 9 mg/dL (ref 8.9–10.3)
Chloride: 92 mmol/L — ABNORMAL LOW (ref 98–111)
Creatinine, Ser: 4.41 mg/dL — ABNORMAL HIGH (ref 0.61–1.24)
GFR calc Af Amer: 17 mL/min — ABNORMAL LOW (ref >60)
GFR calc non Af Amer: 14 mL/min — ABNORMAL LOW (ref >60)
Glucose, Bld: 150 mg/dL — ABNORMAL HIGH (ref 70–99)
Potassium: 4 mmol/L (ref 3.5–5.1)
Sodium: 134 mmol/L — ABNORMAL LOW (ref 135–145)

## 2020-01-14 LAB — MAGNESIUM: Magnesium: 2 mg/dL (ref 1.7–2.4)

## 2020-01-14 NOTE — Progress Notes (Addendum)
Pulmonary [place]   PULMONARY CRITICAL CARE SERVICE  PROGRESS NOTE  Date of Service: 01/14/2020  [name] [name]  WRU:[id number]  DOB: 1965/12/18   DOA: 12/24/2019  Referring Physician: Merton Border, MD  HPI: [name] [name] is a 54 y.o. male seen for follow up of Acute on Chronic Respiratory Failure.  Patient failed wean to aerosol trach collar today twice.  He was tachycardic in the 130s.  He is resting on pressure support no fever or distress.  Medications: Reviewed on Rounds  Physical Exam:  Vitals: Pulse 90 respiration 28 BP 130/88 O2 sat 99% temp 97.3  Ventilator Settings per support 12/5 FiO2 28%  . General: Comfortable at this time . Eyes: Grossly normal lids, irises & conjunctiva . ENT: grossly tongue is normal . Neck: no obvious mass . Cardiovascular: S1 S2 normal no gallop . Respiratory: No rales or rhonchi noted . Abdomen: soft . Skin: no rash seen on limited exam . Musculoskeletal: not rigid . Psychiatric:unable to assess . Neurologic: no seizure no involuntary movements         Lab Data:   Basic Metabolic Panel: Recent Labs  Lab 01/09/20 0536 01/09/20 0536 01/10/20 0547 01/11/20 0553 01/11/20 2136 01/13/20 0742 01/13/20 0954 01/14/20 1045  NA 133*  --  135 131*  --  131*  --  134*  K 3.8   < > 4.1 3.4* 3.8 4.7  --  4.0  CL 91*  --  98 94*  --  93*  --  92*  CO2 24  --  21* 22  --  24  --  24  GLUCOSE 88  --  79 83  --  158*  --  150*  BUN 101*  --  47* 45*  --  53*  --  53*  CREATININE 6.97*  --  4.82* 4.85*  --  5.42*  --  4.41*  CALCIUM 9.3  --  9.3 9.0  --  8.9  --  9.0  MG  --   --  1.8  --  1.8  --  1.9 2.0  PHOS 3.0  --  2.6 2.4*  --  4.4  --   --    < > = values in this interval not displayed.    ABG: No results for input(s): PHART, PCO2ART, PO2ART, HCO3, O2SAT in the last 168 hours.  Liver Function Tests: Recent Labs  Lab 01/09/20 0536 01/10/20 0547 01/11/20 0553 01/13/20 0742   ALBUMIN 3.0* 3.1* 2.9* 3.0*   No results for input(s): LIPASE, AMYLASE in the last 168 hours. No results for input(s): AMMONIA in the last 168 hours.  CBC: Recent Labs  Lab 01/09/20 0536 01/10/20 0547 01/11/20 0553 01/13/20 0742  WBC 6.1 5.3 4.0 3.6*  HGB 9.7* 9.6* 9.3* 10.1*  HCT 31.4* 31.4* 29.8* 33.0*  MCV 89.5 89.2 88.2 88.7  PLT 263 229 196 228    Cardiac Enzymes: No results for input(s): CKTOTAL, CKMB, CKMBINDEX, TROPONINI in the last 168 hours.  BNP (last 3 results) Recent Labs    11/16/19 1356  BNP 962.8*    ProBNP (last 3 results) No results for input(s): PROBNP in the last 8760 hours.  Radiological Exams: DG Chest 1 View  Result Date: 01/12/2020 CLINICAL DATA:  Post thoracentesis RIGHT side. EXAM: CHEST  1 VIEW COMPARISON:  Radiograph 01/10/2020 FINDINGS: Central venous line with tip in the mid SVC. There are bilateral moderate effusions not changed from prior. No Pneumothorax evident following thoracentesis.  Metallic bar midline over the radiograph. Cardiomegaly. Central venous congestion. IMPRESSION: 1. No pneumothorax following thoracentesis. 2. Bilateral small to moderate effusions. Electronically Signed   By: Suzy Bouchard M.D.   On: 01/12/2020 16:23   IR THORACENTESIS ASP PLEURAL SPACE W/IMG GUIDE  Result Date: 01/12/2020 INDICATION: Patient with history of cardiac arrest, respiratory failure now with bilateral pleural effusions. Request is made for therapeutic thoracentesis. EXAM: ULTRASOUND GUIDED THERAPEUTIC RIGHT THORACENTESIS MEDICATIONS: 10 mL 1% lidocaine COMPLICATIONS: None immediate. PROCEDURE: An ultrasound guided thoracentesis was thoroughly discussed with the patient and questions answered. The benefits, risks, alternatives and complications were also discussed. The patient understands and wishes to proceed with the procedure. Written consent was obtained. Ultrasound was performed to localize and mark a small but adequate pocket of fluid in the  right chest. The area was then prepped and draped in the normal sterile fashion. 1% Lidocaine was used for local anesthesia. Under ultrasound guidance a 6 Fr Safe-T-Centesis catheter was introduced. Thoracentesis was performed. The catheter was removed and a dressing applied. FINDINGS: A total of approximately 250 mL of amber fluid was removed. Samples were sent to the laboratory as requested by the clinical team. IMPRESSION: Successful ultrasound guided therapeutic right thoracentesis yielding 250 mL of pleural fluid. Read by: Brynda Greathouse PA-C Electronically Signed   By: Markus Daft M.D.   On: 01/12/2020 16:56    Assessment/Plan Active Problems:   Acute on chronic respiratory failure with hypoxia (HCC)   End stage renal disease on dialysis (HCC)   Acute on chronic systolic and diastolic heart failure, NYHA class 4 (HCC)   Chronic atrial fibrillation ([place])   Acute pulmonary embolism without acute cor pulmonale ([place])   1. Acute on chronic respiratory failure with hypoxia patient continue to attempt to wean to aerosol trach collar.  Continue aggressive pulmonary toilet supportive measures. 2. End-stage renal disease on hemodialysis continue with supportive care 3. Acute on chronic diastolic heart failure monitor fluid status 4. Chronic atrial fibrillation rate controlled 5. Acute pulmonary embolism treated continue to monitor   I have personally seen and evaluated the patient, evaluated laboratory and imaging results, formulated the assessment and plan and placed orders. The Patient requires high complexity decision making with multiple systems involvement.  Rounds were done with the Respiratory Therapy Director and Staff therapists and discussed with nursing staff also.  Allyne Gee, MD [facility] Pulmonary Critical Care Medicine Sleep Medicine

## 2020-01-15 DIAGNOSIS — I2699 Other pulmonary embolism without acute cor pulmonale: Secondary | ICD-10-CM | POA: Diagnosis not present

## 2020-01-15 DIAGNOSIS — J9690 Respiratory failure, unspecified, unspecified whether with hypoxia or hypercapnia: Secondary | ICD-10-CM | POA: Diagnosis not present

## 2020-01-15 DIAGNOSIS — N186 End stage renal disease: Secondary | ICD-10-CM | POA: Diagnosis not present

## 2020-01-15 DIAGNOSIS — I482 Chronic atrial fibrillation, unspecified: Secondary | ICD-10-CM | POA: Diagnosis not present

## 2020-01-15 DIAGNOSIS — J9621 Acute and chronic respiratory failure with hypoxia: Secondary | ICD-10-CM | POA: Diagnosis not present

## 2020-01-15 DIAGNOSIS — I5043 Acute on chronic combined systolic (congestive) and diastolic (congestive) heart failure: Secondary | ICD-10-CM | POA: Diagnosis not present

## 2020-01-15 DIAGNOSIS — Z992 Dependence on renal dialysis: Secondary | ICD-10-CM | POA: Diagnosis not present

## 2020-01-15 DIAGNOSIS — Z9911 Dependence on respirator [ventilator] status: Secondary | ICD-10-CM | POA: Diagnosis not present

## 2020-01-15 DIAGNOSIS — I5040 Unspecified combined systolic (congestive) and diastolic (congestive) heart failure: Secondary | ICD-10-CM | POA: Diagnosis not present

## 2020-01-15 LAB — ECHOCARDIOGRAM LIMITED

## 2020-01-15 NOTE — Progress Notes (Addendum)
Pulmonary [place]   PULMONARY CRITICAL CARE SERVICE  PROGRESS NOTE  Date of Service: 01/15/2020  [name] [name]  EUM:[id number]  DOB: 05-08-1966   DOA: 12/24/2019  Referring Physician: Merton Border, MD  HPI: [name] [name] is a 54 y.o. male seen for follow up of Acute on Chronic Respiratory Failure.  Patient failed aerosol trach collar today and will do 16 hours on pressure support 28% at this time.  Satting well no distress.  Medications: Reviewed on Rounds  Physical Exam:  Vitals: Pulse 88 respirations 21 BP 107/81 O2 sat 100% temp 95.1  Ventilator Settings pressure support 12/5 FiO2 28%  . General: Comfortable at this time . Eyes: Grossly normal lids, irises & conjunctiva . ENT: grossly tongue is normal . Neck: no obvious mass . Cardiovascular: S1 S2 normal no gallop . Respiratory: No rales rhonchi noted . Abdomen: soft . Skin: no rash seen on limited exam . Musculoskeletal: not rigid . Psychiatric:unable to assess . Neurologic: no seizure no involuntary movements         Lab Data:   Basic Metabolic Panel: Recent Labs  Lab 01/09/20 0536 01/09/20 0536 01/10/20 0547 01/11/20 0553 01/11/20 2136 01/13/20 0742 01/13/20 0954 01/14/20 1045  NA 133*  --  135 131*  --  131*  --  134*  K 3.8   < > 4.1 3.4* 3.8 4.7  --  4.0  CL 91*  --  98 94*  --  93*  --  92*  CO2 24  --  21* 22  --  24  --  24  GLUCOSE 88  --  79 83  --  158*  --  150*  BUN 101*  --  47* 45*  --  53*  --  53*  CREATININE 6.97*  --  4.82* 4.85*  --  5.42*  --  4.41*  CALCIUM 9.3  --  9.3 9.0  --  8.9  --  9.0  MG  --   --  1.8  --  1.8  --  1.9 2.0  PHOS 3.0  --  2.6 2.4*  --  4.4  --   --    < > = values in this interval not displayed.    ABG: No results for input(s): PHART, PCO2ART, PO2ART, HCO3, O2SAT in the last 168 hours.  Liver Function Tests: Recent Labs  Lab 01/09/20 0536 01/10/20 0547 01/11/20 0553 01/13/20 0742  ALBUMIN 3.0* 3.1*  2.9* 3.0*   No results for input(s): LIPASE, AMYLASE in the last 168 hours. No results for input(s): AMMONIA in the last 168 hours.  CBC: Recent Labs  Lab 01/09/20 0536 01/10/20 0547 01/11/20 0553 01/13/20 0742  WBC 6.1 5.3 4.0 3.6*  HGB 9.7* 9.6* 9.3* 10.1*  HCT 31.4* 31.4* 29.8* 33.0*  MCV 89.5 89.2 88.2 88.7  PLT 263 229 196 228    Cardiac Enzymes: No results for input(s): CKTOTAL, CKMB, CKMBINDEX, TROPONINI in the last 168 hours.  BNP (last 3 results) Recent Labs    11/16/19 1356  BNP 962.8*    ProBNP (last 3 results) No results for input(s): PROBNP in the last 8760 hours.  Radiological Exams: ECHOCARDIOGRAM LIMITED  Result Date: 01/15/2020   ECHOCARDIOGRAM LIMITED REPORT   Patient Name:   [name] [name] Date of Exam: 01/13/2020 Medical Rec #:  [id number]      Height:       70.5 in Accession #:    [id number]     Weight:  151.2 lb Date of Birth:  [birth year]      BSA:          1.86 m Patient Age:    4 years       BP:           109/52 mmHg Patient Gender: M              HR:           92 bpm. Exam Location:  Inpatient  Procedure: Limited Color Doppler, Cardiac Doppler and Limited Echo Indications:     pericardial effusion  History:         Patient has prior history of Echocardiogram examinations, most                  recent 01/05/2020.  Sonographer:     Roy Referring Phys:  [place] Diagnosing Phys: Dixie Dials MD IMPRESSIONS  1. Left ventricular ejection fraction, by visual estimation, is 30 to 35%. Left ventricular septal wall thickness was normal. Normal left ventricular posterior wall thickness. There is no increased left ventricular wall thickness.  2. Severe hypokinesis of the left ventricular, entire inferoseptal wall and inferior wall.  3. Left ventricular diastolic parameters are indeterminate.  4. Global right ventricle has moderately reduced systolic function.The right ventricular size is moderately enlarged. no increase in right  ventricular wall thickness.  5. Left atrial size was severely dilated.  6. Right atrial size was moderately dilated.  7. Small pericardial effusion.  8. The pericardial effusion is circumferential.  9. Mild thickening of the anterior mitral valve leaflet(s). 10. Moderate mitral annular calcification. 11. The mitral valve is degenerative. Moderate to severe mitral valve regurgitation. 12. The tricuspid valve was normal in structure. Tricuspid valve regurgitation moderate. 13. Mild aortic valve sclerosis without stenosis. 14. Moderate plaque invoving the ascending aorta. 15. Mildly elevated pulmonary artery systolic pressure. 16. The inferior vena cava is dilated in size with >50% respiratory variability, suggesting right atrial pressure of 8 mmHg. 17. Significant decrease in pericardial effusion from prior study last week. FINDINGS  Left Ventricle: Left ventricular ejection fraction, by visual estimation, is 30 to 35%. Severe hypokinesis of the left ventricular, entire inferoseptal wall and inferior wall. Left ventricular septal wall thickness was normal. Normal left ventricular posterior wall thickness. There is no increased left ventricular wall thickness. Left ventricular diastolic parameters are indeterminate. Right Ventricle: The right ventricular size is moderately enlarged. No increase in right ventricular wall thickness. Global RV systolic function is has moderately reduced systolic function. The tricuspid regurgitant velocity is 2.39 m/s, and with an assumed right atrial pressure of 8 mmHg, the estimated right ventricular systolic pressure is mildly elevated at 30.8 mmHg. Left Atrium: Left atrial size was severely dilated. There is mild echo contrast. Right Atrium: Right atrial size was moderately dilated. Right atrial pressure is estimated at 8 mmHg. Pericardium: A small pericardial effusion is present is seen. A small pericardial effusion is present. The pericardial effusion is circumferential. There is no  evidence of cardiac tamponade. There is a small pleural effusion in the left lateral region. Mitral Valve: The mitral valve is degenerative in appearance. There is mild thickening of the anterior mitral valve leaflet(s). Mildly decreased mobility of the mitral valve leaflets. Moderate mitral annular calcification. Moderate to severe mitral valve  regurgitation. Tricuspid Valve: The tricuspid valve is normal in structure. Tricuspid valve regurgitation moderate. Aortic Valve: The aortic valve is tricuspid. . There is mild thickening and mild calcification  of the aortic valve. Aortic valve regurgitation is trivial. Mild aortic valve sclerosis is present, with no evidence of aortic valve stenosis. There is mild thickening of the aortic valve. There is mild calcification of the aortic valve. Moderate aortic valve annular calcification. Pulmonic Valve: The pulmonic valve was normal in structure. Pulmonic valve regurgitation is trivial by color flow Doppler. Pulmonic regurgitation is trivial by color flow Doppler. Aorta: The aortic root and ascending aorta are structurally normal, with no evidence of dilitation. There is moderate, atheroma plaque involving the ascending aorta. Venous: The inferior vena cava is dilated in size with greater than 50% respiratory variability, suggesting right atrial pressure of 8 mmHg. Shunts: No atrial level shunt detected by color flow Doppler.  LEFT VENTRICLE          Normals PLAX 2D LVIDd:         5.80 cm  3.6 cm LVIDs:         4.90 cm  1.7 cm LV PW:         1.10 cm  1.4 cm LV IVS:        0.90 cm  1.3 cm LVOT diam:     2.30 cm  2.0 cm LV SV:         54 ml    79 ml LV SV Index:   29.15    45 ml/m2 LVOT Area:     4.15 cm 3.14 cm2  LEFT ATRIUM         Index LA diam:    5.20 cm 2.79 cm/m   AORTA                 Normals Ao Root diam: 3.30 cm 31 mm TRICUSPID VALVE             Normals TR Peak grad:   22.8 mmHg TR Vmax:        239.00 cm/s 288 cm/s  SHUNTS Systemic Diam: 2.30 cm  Dixie Dials MD  Electronically signed by Dixie Dials MD Signature Date/Time: 01/15/2020/11:10:19 AMThe mitral valve is degenerative in appearance.    Final     Assessment/Plan Active Problems:   Acute on chronic respiratory failure with hypoxia (HCC)   End stage renal disease on dialysis (HCC)   Acute on chronic systolic and diastolic heart failure, NYHA class 4 (HCC)   Chronic atrial fibrillation (HCC)   Acute pulmonary embolism without acute cor pulmonale ([place])   1. Acute on chronic respiratory failure with hypoxia patient continue to attempt to wean to aerosol trach collar.  Continue aggressive pulmonary toilet supportive measures. 2. End-stage renal disease on hemodialysis continue with supportive care 3. Acute on chronic diastolic heart failure monitor fluid status 4. Chronic atrial fibrillation rate controlled 5. Acute pulmonary embolism treated continue to monitorEarly   I have personally seen and evaluated the patient, evaluated laboratory and imaging results, formulated the assessment and plan and placed orders. The Patient requires high complexity decision making with multiple systems involvement.  Rounds were done with the Respiratory Therapy Director and Staff therapists and discussed with nursing staff also.  Allyne Gee, MD [facility] Pulmonary Critical Care Medicine Sleep Medicine

## 2020-01-16 DIAGNOSIS — I4821 Permanent atrial fibrillation: Secondary | ICD-10-CM | POA: Diagnosis not present

## 2020-01-16 DIAGNOSIS — Z9911 Dependence on respirator [ventilator] status: Secondary | ICD-10-CM | POA: Diagnosis not present

## 2020-01-16 DIAGNOSIS — D631 Anemia in chronic kidney disease: Secondary | ICD-10-CM | POA: Diagnosis not present

## 2020-01-16 DIAGNOSIS — I5043 Acute on chronic combined systolic (congestive) and diastolic (congestive) heart failure: Secondary | ICD-10-CM | POA: Diagnosis not present

## 2020-01-16 DIAGNOSIS — I482 Chronic atrial fibrillation, unspecified: Secondary | ICD-10-CM | POA: Diagnosis not present

## 2020-01-16 DIAGNOSIS — N2581 Secondary hyperparathyroidism of renal origin: Secondary | ICD-10-CM | POA: Diagnosis not present

## 2020-01-16 DIAGNOSIS — J9621 Acute and chronic respiratory failure with hypoxia: Secondary | ICD-10-CM | POA: Diagnosis not present

## 2020-01-16 DIAGNOSIS — I959 Hypotension, unspecified: Secondary | ICD-10-CM | POA: Diagnosis not present

## 2020-01-16 DIAGNOSIS — Z992 Dependence on renal dialysis: Secondary | ICD-10-CM | POA: Diagnosis not present

## 2020-01-16 DIAGNOSIS — J9690 Respiratory failure, unspecified, unspecified whether with hypoxia or hypercapnia: Secondary | ICD-10-CM | POA: Diagnosis not present

## 2020-01-16 DIAGNOSIS — J9601 Acute respiratory failure with hypoxia: Secondary | ICD-10-CM | POA: Diagnosis not present

## 2020-01-16 DIAGNOSIS — N186 End stage renal disease: Secondary | ICD-10-CM | POA: Diagnosis not present

## 2020-01-16 DIAGNOSIS — I2699 Other pulmonary embolism without acute cor pulmonale: Secondary | ICD-10-CM | POA: Diagnosis not present

## 2020-01-16 DIAGNOSIS — I5021 Acute systolic (congestive) heart failure: Secondary | ICD-10-CM | POA: Diagnosis not present

## 2020-01-16 DIAGNOSIS — I5040 Unspecified combined systolic (congestive) and diastolic (congestive) heart failure: Secondary | ICD-10-CM | POA: Diagnosis not present

## 2020-01-16 LAB — CBC
HCT: 37.6 % — ABNORMAL LOW (ref 39.0–52.0)
Hemoglobin: 11.6 g/dL — ABNORMAL LOW (ref 13.0–17.0)
MCH: 27.2 pg (ref 26.0–34.0)
MCHC: 30.9 g/dL (ref 30.0–36.0)
MCV: 88.1 fL (ref 80.0–100.0)
Platelets: 277 10*3/uL (ref 150–400)
RBC: 4.27 MIL/uL (ref 4.22–5.81)
RDW: 16.5 % — ABNORMAL HIGH (ref 11.5–15.5)
WBC: 6.4 10*3/uL (ref 4.0–10.5)
nRBC: 0 % (ref 0.0–0.2)

## 2020-01-16 LAB — RENAL FUNCTION PANEL
Albumin: 3.2 g/dL — ABNORMAL LOW (ref 3.5–5.0)
Anion gap: 21 — ABNORMAL HIGH (ref 5–15)
BUN: 119 mg/dL — ABNORMAL HIGH (ref 6–20)
CO2: 22 mmol/L (ref 22–32)
Calcium: 9 mg/dL (ref 8.9–10.3)
Chloride: 90 mmol/L — ABNORMAL LOW (ref 98–111)
Creatinine, Ser: 6.29 mg/dL — ABNORMAL HIGH (ref 0.61–1.24)
GFR calc Af Amer: 11 mL/min — ABNORMAL LOW (ref >60)
GFR calc non Af Amer: 9 mL/min — ABNORMAL LOW (ref >60)
Glucose, Bld: 125 mg/dL — ABNORMAL HIGH (ref 70–99)
Phosphorus: 3.8 mg/dL (ref 2.5–4.6)
Potassium: 4.2 mmol/L (ref 3.5–5.1)
Sodium: 133 mmol/L — ABNORMAL LOW (ref 135–145)

## 2020-01-16 LAB — MAGNESIUM: Magnesium: 2.4 mg/dL (ref 1.7–2.4)

## 2020-01-16 NOTE — Progress Notes (Signed)
[facility]  ROUNDING NOTE   Subjective:  Patient seen at bedside. Currently on dialysis. Appears to be having frequent PVCs. Potassium acceptable at 4.2. Repeat magnesium pending.  Yesterday magnesium was 2.0.  Objective:  Vital signs in last 24 hours:  Temperature 96.6 pulse 61 respirations 21 blood pressure 150/72  Physical Exam: General: Chronically ill-appearing  Head: Normocephalic, atraumatic. Moist oral mucosal membranes  Eyes: Anicteric  Neck: Tracheostomy present, neck swelling noted  Lungs:  Scattered rhonchi  Heart: S1S2 no rubs irregular  Abdomen:  Soft, nontender, bowel sounds present, distended  Extremities: Trace peripheral edema.  Neurologic: Awake, alert, following commands  Skin: Right and left toes covered  Access: Right IJ temporary dialysis catheter    Basic Metabolic Panel: Recent Labs  Lab 01/10/20 0547 01/10/20 0547 01/11/20 0553 01/11/20 0553 01/11/20 2136 01/13/20 0742 01/13/20 0954 01/14/20 1045 01/16/20 0550  NA 135  --  131*  --   --  131*  --  134* 133*  K 4.1   < > 3.4*  --  3.8 4.7  --  4.0 4.2  CL 98  --  94*  --   --  93*  --  92* 90*  CO2 21*  --  22  --   --  24  --  24 22  GLUCOSE 79  --  83  --   --  158*  --  150* 125*  BUN 47*  --  45*  --   --  53*  --  53* 119*  CREATININE 4.82*  --  4.85*  --   --  5.42*  --  4.41* 6.29*  CALCIUM 9.3   < > 9.0   < >  --  8.9  --  9.0 9.0  MG 1.8  --   --   --  1.8  --  1.9 2.0  --   PHOS 2.6  --  2.4*  --   --  4.4  --   --  3.8   < > = values in this interval not displayed.    Liver Function Tests: Recent Labs  Lab 01/10/20 0547 01/11/20 0553 01/13/20 0742 01/16/20 0550  ALBUMIN 3.1* 2.9* 3.0* 3.2*   No results for input(s): LIPASE, AMYLASE in the last 168 hours. No results for input(s): AMMONIA in the last 168 hours.  CBC: Recent Labs  Lab 01/10/20 0547 01/11/20 0553 01/13/20 0742 01/16/20 0550  WBC 5.3 4.0 3.6* 6.4  HGB 9.6* 9.3* 10.1* 11.6*  HCT  31.4* 29.8* 33.0* 37.6*  MCV 89.2 88.2 88.7 88.1  PLT 229 196 228 277    Cardiac Enzymes: No results for input(s): CKTOTAL, CKMB, CKMBINDEX, TROPONINI in the last 168 hours.  BNP: Invalid input(s): POCBNP  CBG: No results for input(s): GLUCAP in the last 168 hours.  Microbiology: Results for orders placed or performed during the hospital encounter of 11/16/19  SARS Coronavirus 2 by RT PCR (hospital order, performed in [facility] hospital lab) Nasopharyngeal Nasopharyngeal Swab     Status: None   Collection Time: 11/16/19 11:41 AM   Specimen: Nasopharyngeal Swab  Result Value Ref Range Status   SARS Coronavirus 2 NEGATIVE NEGATIVE Final    Comment: (NOTE) SARS-CoV-2 target nucleic acids are NOT DETECTED. The SARS-CoV-2 RNA is generally detectable in upper and lower respiratory specimens during the acute phase of infection. The lowest concentration of SARS-CoV-2 viral copies this assay can detect is 250 copies / mL. A negative result does not preclude  SARS-CoV-2 infection and should not be used as the sole basis for treatment or other patient management decisions.  A negative result may occur with improper specimen collection / handling, submission of specimen other than nasopharyngeal swab, presence of viral mutation(s) within the areas targeted by this assay, and inadequate number of viral copies (<250 copies / mL). A negative result must be combined with clinical observations, patient history, and epidemiological information. Fact Sheet for Patients:   StrictlyIdeas.no Fact Sheet for Healthcare Providers: BankingDealers.co.za This test is not yet approved or cleared  by the [place] FDA and has been authorized for detection and/or diagnosis of SARS-CoV-2 by FDA under an Emergency Use Authorization (EUA).  This EUA will remain in effect (meaning this test can be used) for the duration of the COVID-19 declaration under  Section 564(b)(1) of the Act, 21 U.S.C. section 360bbb-3(b)(1), unless the authorization is terminated or revoked sooner. Performed at [facility], [place] [address] [place], [place] [address]   Culture, blood (routine x 2)     Status: None   Collection Time: 11/16/19  6:03 PM   Specimen: BLOOD RIGHT HAND  Result Value Ref Range Status   Specimen Description BLOOD RIGHT HAND  Final   Special Requests   Final    BOTTLES DRAWN AEROBIC AND ANAEROBIC Blood Culture results may not be optimal due to an inadequate volume of blood received in culture bottles   Culture   Final    NO GROWTH 5 DAYS Performed at [facility], [place] [address] [place], [place] [address]    Report Status 11/21/2019 FINAL  Final  MRSA PCR Screening     Status: None   Collection Time: 11/16/19  6:05 PM   Specimen: Nasal Mucosa; Nasopharyngeal  Result Value Ref Range Status   MRSA by PCR NEGATIVE NEGATIVE Final    Comment:        The GeneXpert MRSA Assay (FDA approved for NASAL specimens only), is one component of a comprehensive MRSA colonization surveillance program. It is not intended to diagnose MRSA infection nor to guide or monitor treatment for MRSA infections. Performed at [facility], [place] [address] [place], [place] [address]   Culture, blood (routine x 2)     Status: None   Collection Time: 11/16/19  6:08 PM   Specimen: BLOOD RIGHT HAND  Result Value Ref Range Status   Specimen Description BLOOD RIGHT HAND  Final   Special Requests   Final    BOTTLES DRAWN AEROBIC ONLY Blood Culture results may not be optimal due to an inadequate volume of blood received in culture bottles   Culture   Final    NO GROWTH 5 DAYS Performed at [facility], [place] [address] [place], [place] [address]    Report Status 11/21/2019 FINAL  Final  Culture, respiratory (non-expectorated)     Status: None   Collection Time: 11/18/19 12:28 PM   Specimen: Tracheal Aspirate; Respiratory  Result  Value Ref Range Status   Specimen Description TRACHEAL ASPIRATE  Final   Special Requests NONE  Final   Gram Stain   Final    ABUNDANT WBC PRESENT,BOTH PMN AND MONONUCLEAR ABUNDANT GRAM POSITIVE COCCI ABUNDANT GRAM NEGATIVE RODS FEW GRAM VARIABLE ROD    Culture   Final    ABUNDANT HAEMOPHILUS INFLUENZAE BETA LACTAMASE POSITIVE Performed at [facility], [place] [address] [place], [place] [address]    Report Status 11/20/2019 FINAL  Final  Surgical  pcr screen     Status: Abnormal   Collection Time: 11/24/19  7:53 AM   Specimen: Nasal Mucosa; Nasal Swab  Result Value Ref Range Status   MRSA, PCR NEGATIVE NEGATIVE Final   Staphylococcus aureus POSITIVE (A) NEGATIVE Final    Comment: (NOTE) The Xpert SA Assay (FDA approved for NASAL specimens in patients 55 years of age and older), is one component of a comprehensive surveillance program. It is not intended to diagnose infection nor to guide or monitor treatment. Performed at [facility], [place] [address], [place], [place] [address]   Culture, blood (routine x 2)     Status: None   Collection Time: 11/25/19  3:30 PM   Specimen: BLOOD  Result Value Ref Range Status   Specimen Description BLOOD RIGHT ANTECUBITAL  Final   Special Requests   Final    BOTTLES DRAWN AEROBIC ONLY Blood Culture results may not be optimal due to an inadequate volume of blood received in culture bottles   Culture   Final    NO GROWTH 5 DAYS Performed at [facility], [place] [address] [place], [place] [address]    Report Status 11/30/2019 FINAL  Final  Culture, blood (routine x 2)     Status: None   Collection Time: 11/25/19  3:30 PM   Specimen: BLOOD RIGHT HAND  Result Value Ref Range Status   Specimen Description BLOOD RIGHT HAND  Final   Special Requests   Final    BOTTLES DRAWN AEROBIC AND ANAEROBIC Blood Culture results may not be optimal due to an inadequate volume of blood received in culture bottles   Culture   Final    NO  GROWTH 5 DAYS Performed at [facility], [place] [address] [place], [place] [address]    Report Status 11/30/2019 FINAL  Final  Culture, respiratory (non-expectorated)     Status: None   Collection Time: 11/25/19  4:35 PM   Specimen: Tracheal Aspirate; Respiratory  Result Value Ref Range Status   Specimen Description TRACHEAL ASPIRATE  Final   Special Requests NONE  Final   Gram Stain   Final    ABUNDANT WBC PRESENT,BOTH PMN AND MONONUCLEAR FEW GRAM POSITIVE COCCI Performed at [facility], [place] [address], [place], [place] [address]    Culture FEW STAPHYLOCOCCUS EPIDERMIDIS  Final   Report Status 11/28/2019 FINAL  Final   Organism ID, Bacteria STAPHYLOCOCCUS EPIDERMIDIS  Final      Susceptibility   Staphylococcus epidermidis - MIC*    CIPROFLOXACIN <=0.5 SENSITIVE Sensitive     ERYTHROMYCIN >=8 RESISTANT Resistant     GENTAMICIN <=0.5 SENSITIVE Sensitive     OXACILLIN >=4 RESISTANT Resistant     TETRACYCLINE <=1 SENSITIVE Sensitive     VANCOMYCIN 2 SENSITIVE Sensitive     TRIMETH/SULFA <=10 SENSITIVE Sensitive     CLINDAMYCIN >=8 RESISTANT Resistant     RIFAMPIN <=0.5 SENSITIVE Sensitive     Inducible Clindamycin NEGATIVE Sensitive     * FEW STAPHYLOCOCCUS EPIDERMIDIS  Culture, respiratory (non-expectorated)     Status: None   Collection Time: 12/15/19  4:12 PM   Specimen: Tracheal Aspirate; Respiratory  Result Value Ref Range Status   Specimen Description TRACHEAL ASPIRATE  Final   Special Requests NONE  Final   Gram Stain   Final    MODERATE WBC PRESENT, PREDOMINANTLY PMN MODERATE GRAM POSITIVE COCCI IN PAIRS IN CLUSTERS Performed at [facility], [address] [place], [place]  [address]    Culture MODERATE STAPHYLOCOCCUS AUREUS  Final   Report Status 12/17/2019 FINAL  Final   Organism ID, Bacteria STAPHYLOCOCCUS AUREUS  Final      Susceptibility   Staphylococcus aureus - MIC*    CIPROFLOXACIN <=0.5 SENSITIVE Sensitive     ERYTHROMYCIN <=0.25  SENSITIVE Sensitive     GENTAMICIN <=0.5 SENSITIVE Sensitive     OXACILLIN <=0.25 SENSITIVE Sensitive     TETRACYCLINE <=1 SENSITIVE Sensitive     VANCOMYCIN 1 SENSITIVE Sensitive     TRIMETH/SULFA <=10 SENSITIVE Sensitive     CLINDAMYCIN <=0.25 SENSITIVE Sensitive     RIFAMPIN <=0.5 SENSITIVE Sensitive     Inducible Clindamycin NEGATIVE Sensitive     * MODERATE STAPHYLOCOCCUS AUREUS  MRSA PCR Screening     Status: None   Collection Time: 12/17/19 10:25 AM   Specimen: Nasopharyngeal  Result Value Ref Range Status   MRSA by PCR NEGATIVE NEGATIVE Final    Comment:        The GeneXpert MRSA Assay (FDA approved for NASAL specimens only), is one component of a comprehensive MRSA colonization surveillance program. It is not intended to diagnose MRSA infection nor to guide or monitor treatment for MRSA infections. Performed at [facility], [place] [address] [place], [place] [address]   Culture, blood (routine x 2)     Status: None   Collection Time: 12/17/19  3:15 PM   Specimen: BLOOD  Result Value Ref Range Status   Specimen Description BLOOD RIGHT ANTECUBITAL  Final   Special Requests   Final    BOTTLES DRAWN AEROBIC AND ANAEROBIC Blood Culture results may not be optimal due to an inadequate volume of blood received in culture bottles   Culture   Final    NO GROWTH 5 DAYS Performed at [facility], [place] [address], [place], [place] [address]    Report Status 12/22/2019 FINAL  Final  Culture, blood (routine x 2)     Status: None   Collection Time: 12/17/19  3:20 PM   Specimen: BLOOD RIGHT HAND  Result Value Ref Range Status   Specimen Description BLOOD RIGHT HAND  Final   Special Requests   Final    BOTTLES DRAWN AEROBIC ONLY Blood Culture results may not be optimal due to an inadequate volume of blood received in culture bottles   Culture   Final    NO GROWTH 5 DAYS Performed at [facility], [place] [address], [place], [place] [address]    Report Status  12/22/2019 FINAL  Final    Coagulation Studies: No results for input(s): LABPROT, INR in the last 72 hours.  Urinalysis: No results for input(s): COLORURINE, LABSPEC, PHURINE, GLUCOSEU, HGBUR, BILIRUBINUR, KETONESUR, PROTEINUR, UROBILINOGEN, NITRITE, LEUKOCYTESUR in the last 72 hours.  Invalid input(s): APPERANCEUR    Imaging: No results found.   Medications:       Assessment/ Plan:  53 y.o. male with a PMHx of recent cardiac arrest status post hypothermia protocol, ESRD on HD MWF, anemia of chronic kidney disease, secondary hyperparathyroidism, history of polysubstance abuse, hypertension,, chronic systolic heart failure, medical nonadherence who was admitted to [facility] on 12/24/2019 for ongoing management of acute respiratory failure.  1.  ESRD on HD MWF.  Patient seen during dialysis treatment today.  Having frequent PVCs.  Potassium acceptable at 4.2.  We are repeating serum magnesium both serum magnesium was normal yesterday 2.0.  UF currently off.  2.  Anemia of chronic kidney disease.  Hemoglobin up to 11.6.  Continue to monitor closely.  3.  Secondary hyperparathyroidism.  Phosphorus 3.8 and at target.  4.  Hyperkalemia.  Resolved.  5.  Hypotension.  Currently off of norepinephrine during dialysis treatment.  We have written for as needed albumin as well.   LOS: 0 Zanovia Rotz 2/1/20218:22 AM

## 2020-01-16 NOTE — Progress Notes (Signed)
Pulmonary Critical Care Medicine [place]   PULMONARY CRITICAL CARE SERVICE  PROGRESS NOTE  Date of Service: 01/16/2020  Haidar Muse  TTS:[id number]  DOB: 02/10/1966   DOA: 12/24/2019  Referring Physician: Merton Border, MD  HPI: [name] [name] is a 54 y.o. male seen for follow up of Acute on Chronic Respiratory Failure.  Patient is on full support right now on pressure support mode resting mode 12/5 supposed to be able to try T collar possibly today.  Apparently overnight patient was left on the pressure support mode and actually did well.  When the patient was placed on the regular vent settings he did not tolerate  Medications: Reviewed on Rounds  Physical Exam:  Vitals: Temperature 96.8 pulse 90 respiratory rate 22 blood pressure is 118/79 saturations 99%  Ventilator Settings mode ventilation pressure support FiO2 is 30% pressure support 12/5  . General: Comfortable at this time . Eyes: Grossly normal lids, irises & conjunctiva . ENT: grossly tongue is normal . Neck: no obvious mass . Cardiovascular: S1 S2 normal no gallop . Respiratory: No rhonchi no rales are noted at this time . Abdomen: soft . Skin: no rash seen on limited exam . Musculoskeletal: not rigid . Psychiatric:unable to assess . Neurologic: no seizure no involuntary movements         Lab Data:   Basic Metabolic Panel: Recent Labs  Lab 01/10/20 0547 01/10/20 0547 01/11/20 0553 01/11/20 2136 01/13/20 0742 01/13/20 0954 01/14/20 1045 01/16/20 0550  NA 135  --  131*  --  131*  --  134* 133*  K 4.1   < > 3.4* 3.8 4.7  --  4.0 4.2  CL 98  --  94*  --  93*  --  92* 90*  CO2 21*  --  22  --  24  --  24 22  GLUCOSE 79  --  83  --  158*  --  150* 125*  BUN 47*  --  45*  --  53*  --  53* 119*  CREATININE 4.82*  --  4.85*  --  5.42*  --  4.41* 6.29*  CALCIUM 9.3  --  9.0  --  8.9  --  9.0 9.0  MG 1.8  --   --  1.8  --  1.9 2.0 2.4  PHOS 2.6  --  2.4*  --  4.4  --   --  3.8   < > =  values in this interval not displayed.    ABG: No results for input(s): PHART, PCO2ART, PO2ART, HCO3, O2SAT in the last 168 hours.  Liver Function Tests: Recent Labs  Lab 01/10/20 0547 01/11/20 0553 01/13/20 0742 01/16/20 0550  ALBUMIN 3.1* 2.9* 3.0* 3.2*   No results for input(s): LIPASE, AMYLASE in the last 168 hours. No results for input(s): AMMONIA in the last 168 hours.  CBC: Recent Labs  Lab 01/10/20 0547 01/11/20 0553 01/13/20 0742 01/16/20 0550  WBC 5.3 4.0 3.6* 6.4  HGB 9.6* 9.3* 10.1* 11.6*  HCT 31.4* 29.8* 33.0* 37.6*  MCV 89.2 88.2 88.7 88.1  PLT 229 196 228 277    Cardiac Enzymes: No results for input(s): CKTOTAL, CKMB, CKMBINDEX, TROPONINI in the last 168 hours.  BNP (last 3 results) Recent Labs    11/16/19 1356  BNP 962.8*    ProBNP (last 3 results) No results for input(s): PROBNP in the last 8760 hours.  Radiological Exams: No results found.  Assessment/Plan Active Problems:   Acute on chronic  respiratory failure with hypoxia (HCC)   End stage renal disease on dialysis ([place])   Acute on chronic systolic and diastolic heart failure, NYHA class 4 (HCC)   Chronic atrial fibrillation (HCC)   Acute pulmonary embolism without acute cor pulmonale ([place])   1. Acute on chronic respiratory failure with hypoxia plan is to continue with pressure support mode weaning instead of placing back on full rate for now.  Also will try on T collar trials today as discussed on rounds 2. End-stage renal disease on hemodialysis followed by nephrology 3. Acute on chronic systolic heart failure continue with present management 4. Chronic atrial fibrillation rate is controlled 5. Acute pulmonary embolism no changes are noted at this time   I have personally seen and evaluated the patient, evaluated laboratory and imaging results, formulated the assessment and plan and placed orders. The Patient requires high complexity decision making with multiple systems  involvement.  Rounds were done with the Respiratory Therapy Director and Staff therapists and discussed with nursing staff also.  Allyne Gee, MD [facility] Pulmonary Critical Care Medicine Sleep Medicine

## 2020-01-17 DIAGNOSIS — I5040 Unspecified combined systolic (congestive) and diastolic (congestive) heart failure: Secondary | ICD-10-CM | POA: Diagnosis not present

## 2020-01-17 DIAGNOSIS — N186 End stage renal disease: Secondary | ICD-10-CM | POA: Diagnosis not present

## 2020-01-17 DIAGNOSIS — J9621 Acute and chronic respiratory failure with hypoxia: Secondary | ICD-10-CM | POA: Diagnosis not present

## 2020-01-17 DIAGNOSIS — I5043 Acute on chronic combined systolic (congestive) and diastolic (congestive) heart failure: Secondary | ICD-10-CM | POA: Diagnosis not present

## 2020-01-17 DIAGNOSIS — Z992 Dependence on renal dialysis: Secondary | ICD-10-CM | POA: Diagnosis not present

## 2020-01-17 DIAGNOSIS — Z9911 Dependence on respirator [ventilator] status: Secondary | ICD-10-CM | POA: Diagnosis not present

## 2020-01-17 DIAGNOSIS — J9690 Respiratory failure, unspecified, unspecified whether with hypoxia or hypercapnia: Secondary | ICD-10-CM | POA: Diagnosis not present

## 2020-01-17 DIAGNOSIS — I2699 Other pulmonary embolism without acute cor pulmonale: Secondary | ICD-10-CM | POA: Diagnosis not present

## 2020-01-17 DIAGNOSIS — I482 Chronic atrial fibrillation, unspecified: Secondary | ICD-10-CM | POA: Diagnosis not present

## 2020-01-17 NOTE — Consult Note (Signed)
Late entry. Ref: Jean Rosenthal, MD   Subjective:  Resting comfortably. Undergoing hemodialysis. Echocardiogram showes 50 % reduction in pericardial effusion.  Objective:  Vital Signs in the last 24 hours:  P: 90, R: 22, BP: 118/79, Saturation: 99 %  Physical Exam: BP Readings from Last 1 Encounters:  01/09/20 104/88     Wt Readings from Last 1 Encounters:  12/22/19 68.6 kg    Weight change:  There is no height or weight on file to calculate BMI. HEENT: [place]/AT, Eyes-Brown, PERL, EOMI, Conjunctiva-Pink, Sclera-Non-icteric Neck: No JVD, No bruit, Trachea midline. Lungs:  Clearing, Bilateral. Cardiac:  Irregular rhythm, normal S1 and S2, no S3. II/VI systolic murmur. Abdomen:  Soft, non-tender. BS present. Extremities:  No edema present. No cyanosis. No clubbing. Bilateral feet gangrene. CNS: AxOx2, Cranial nerves grossly intact, moves all 4 extremities.  Skin: Warm and dry.   Intake/Output from previous day: No intake/output data recorded.    Lab Results: BMET    Component Value Date/Time   NA 133 (L) 01/16/2020 0550   NA 134 (L) 01/14/2020 1045   NA 131 (L) 01/13/2020 0742   K 4.2 01/16/2020 0550   K 4.0 01/14/2020 1045   K 4.7 01/13/2020 0742   CL 90 (L) 01/16/2020 0550   CL 92 (L) 01/14/2020 1045   CL 93 (L) 01/13/2020 0742   CO2 22 01/16/2020 0550   CO2 24 01/14/2020 1045   CO2 24 01/13/2020 0742   GLUCOSE 125 (H) 01/16/2020 0550   GLUCOSE 150 (H) 01/14/2020 1045   GLUCOSE 158 (H) 01/13/2020 0742   BUN 119 (H) 01/16/2020 0550   BUN 53 (H) 01/14/2020 1045   BUN 53 (H) 01/13/2020 0742   CREATININE 6.29 (H) 01/16/2020 0550   CREATININE 4.41 (H) 01/14/2020 1045   CREATININE 5.42 (H) 01/13/2020 0742   CALCIUM 9.0 01/16/2020 0550   CALCIUM 9.0 01/14/2020 1045   CALCIUM 8.9 01/13/2020 0742   CALCIUM 8.6 (L) 12/23/2019 0323   CALCIUM 8.2 (L) 10/21/2010 1420   CALCIUM 8.7 06/24/2009 0542   GFRNONAA 9 (L) 01/16/2020 0550   GFRNONAA 14 (L) 01/14/2020 1045   GFRNONAA 11 (L) 01/13/2020 0742   GFRAA 11 (L) 01/16/2020 0550   GFRAA 17 (L) 01/14/2020 1045   GFRAA 13 (L) 01/13/2020 0742   CBC    Component Value Date/Time   WBC 6.4 01/16/2020 0550   RBC 4.27 01/16/2020 0550   HGB 11.6 (L) 01/16/2020 0550   HGB 12.3 (L) 12/31/2017 1012   HCT 37.6 (L) 01/16/2020 0550   HCT 37.4 (L) 12/31/2017 1012   PLT 277 01/16/2020 0550   PLT 176 12/31/2017 1012   MCV 88.1 01/16/2020 0550   MCV 99 (H) 12/31/2017 1012   MCH 27.2 01/16/2020 0550   MCHC 30.9 01/16/2020 0550   RDW 16.5 (H) 01/16/2020 0550   RDW 18.8 (H) 12/31/2017 1012   LYMPHSABS 1.1 12/17/2019 1504   LYMPHSABS 1.0 12/31/2017 1012   MONOABS 1.7 (H) 12/17/2019 1504   EOSABS 0.5 12/17/2019 1504   EOSABS 0.1 12/31/2017 1012   BASOSABS 0.1 12/17/2019 1504   BASOSABS 0.0 12/31/2017 1012   HEPATIC Function Panel Recent Labs    12/17/19 0017 12/19/19 0446 12/24/19 0539  PROT 8.1 7.4 7.6   HEMOGLOBIN A1C No components found for: HGA1C,  MPG CARDIAC ENZYMES Lab Results  Component Value Date   CKTOTAL 520 (H) 07/16/2012   CKMB 10.7 (HH) 07/16/2012   TROPONINI 0.22 (HH) 11/13/2017   TROPONINI 0.19 (HH) 11/12/2017  TROPONINI 0.26 (HH) 05/19/2017   BNP No results for input(s): PROBNP in the last 8760 hours. TSH Recent Labs    11/16/19 1730 12/25/19 0350  TSH 7.112* 6.933*   CHOLESTEROL No results for input(s): CHOL in the last 8760 hours.  Scheduled Meds: Continuous Infusions: PRN Meds:.lidocaine  Assessment/Plan: Mild pericardial effusion Acute on chronic respiratory failure with hypoxemia Moderate LV systolic function ESRD Chronic atrial fibrillation H/O PE Bilateral feet gangrene S/P cardiac arrest x 2 NSVT Anemia of chronic disease  Continue medical treatment.   LOS: 0 days   Time spent including chart review, lab review, examination, discussion with patient :  min   Dixie Dials  MD  01/17/2020, 10:19 AM

## 2020-01-17 NOTE — Progress Notes (Signed)
Pulmonary Critical Care Medicine [place]   PULMONARY CRITICAL CARE SERVICE  PROGRESS NOTE  Date of Service: 01/17/2020  [name] [name]  [id number]  DOB: May 02, 1966   DOA: 12/24/2019  Referring Physician: Merton Border, MD  HPI: [name] [name] is a 54 y.o. male seen for follow up of Acute on Chronic Respiratory Failure.  This morning patient is on pressure support mode has been on 28% FiO2 good saturations are noted.  Not tolerating T collar however  Medications: Reviewed on Rounds  Physical Exam:  Vitals: Temperature 96.2 pulse 90 respiratory rate 20 blood pressure is 130/82 saturations 98%  Ventilator Settings mode of ventilation pressure support FiO2 28% pressure 12 PEEP 5 tidal volume 383  . General: Comfortable at this time . Eyes: Grossly normal lids, irises & conjunctiva . ENT: grossly tongue is normal . Neck: no obvious mass . Cardiovascular: S1 S2 normal no gallop . Respiratory: Scattered rhonchi expansion is equal . Abdomen: soft . Skin: no rash seen on limited exam . Musculoskeletal: not rigid . Psychiatric:unable to assess . Neurologic: no seizure no involuntary movements         Lab Data:   Basic Metabolic Panel: Recent Labs  Lab 01/11/20 0553 01/11/20 2136 01/13/20 0742 01/13/20 0954 01/14/20 1045 01/16/20 0550  NA 131*  --  131*  --  134* 133*  K 3.4* 3.8 4.7  --  4.0 4.2  CL 94*  --  93*  --  92* 90*  CO2 22  --  24  --  24 22  GLUCOSE 83  --  158*  --  150* 125*  BUN 45*  --  53*  --  53* 119*  CREATININE 4.85*  --  5.42*  --  4.41* 6.29*  CALCIUM 9.0  --  8.9  --  9.0 9.0  MG  --  1.8  --  1.9 2.0 2.4  PHOS 2.4*  --  4.4  --   --  3.8    ABG: No results for input(s): PHART, PCO2ART, PO2ART, HCO3, O2SAT in the last 168 hours.  Liver Function Tests: Recent Labs  Lab 01/11/20 0553 01/13/20 0742 01/16/20 0550  ALBUMIN 2.9* 3.0* 3.2*   No results for input(s): LIPASE, AMYLASE in the last 168 hours. No results for  input(s): AMMONIA in the last 168 hours.  CBC: Recent Labs  Lab 01/11/20 0553 01/13/20 0742 01/16/20 0550  WBC 4.0 3.6* 6.4  HGB 9.3* 10.1* 11.6*  HCT 29.8* 33.0* 37.6*  MCV 88.2 88.7 88.1  PLT 196 228 277    Cardiac Enzymes: No results for input(s): CKTOTAL, CKMB, CKMBINDEX, TROPONINI in the last 168 hours.  BNP (last 3 results) Recent Labs    11/16/19 1356  BNP 962.8*    ProBNP (last 3 results) No results for input(s): PROBNP in the last 8760 hours.  Radiological Exams: No results found.  Assessment/Plan Active Problems:   Acute on chronic respiratory failure with hypoxia (HCC)   End stage renal disease on dialysis (HCC)   Acute on chronic systolic and diastolic heart failure, NYHA class 4 (HCC)   Chronic atrial fibrillation (HCC)   Acute pulmonary embolism without acute cor pulmonale ([place])   1. Acute on chronic respiratory failure hypoxia plan is to continue with pressure support mode patient's right now on 20% FiO2 12/5 pressure support levels will reassess for T collar weaning 2. End-stage renal disease on hemodialysis followed by nephrology 3. Acute on chronic systolic and diastolic heart failure compensated we  will continue with present management 4. Chronic atrial fibrillation rate controlled 5. Acute pulmonary embolism treated we will continue to monitor   I have personally seen and evaluated the patient, evaluated laboratory and imaging results, formulated the assessment and plan and placed orders. The Patient requires high complexity decision making with multiple systems involvement.  Rounds were done with the Respiratory Therapy Director and Staff therapists and discussed with nursing staff also.  Allyne Gee, MD [facility] Pulmonary Critical Care Medicine Sleep Medicine

## 2020-01-17 NOTE — Progress Notes (Signed)
[facility]  Subjective  - Nodes yes and no to questions.  Mechanical ventilation with trac.   Objective 104/88 (!) 101   20 100% No intake or output data in the 24 hours ending 01/17/20 0838  Right GT tip superficial wound with red base, left second toe with tip supuerficial wound.  Sensation and motor intact with palpable DP pulses.     Assessment/Planning: 54 y.o. male who presented with cardiac arrest and required pressorsfor extended time now with ischemic changes to BLE  Well healed feet and toes recovered with palpable pulses B after prolonged pressor use.   Activity as tolerates no indication for vascular intervention.  Call us if we are needed in the future.   Roxy Horseman 01/17/2020 8:38 AM --  Laboratory Lab Results: Recent Labs    01/16/20 0550  WBC 6.4  HGB 11.6*  HCT 37.6*  PLT 277   BMET Recent Labs    01/14/20 1045 01/16/20 0550  NA 134* 133*  K 4.0 4.2  CL 92* 90*  CO2 24 22  GLUCOSE 150* 125*  BUN 53* 119*  CREATININE 4.41* 6.29*  CALCIUM 9.0 9.0    COAG Lab Results  Component Value Date   INR 1.6 (H) 11/16/2019   INR 1.5 (H) 11/16/2019   INR 1.36 10/27/2018   No results found for: PTT

## 2020-01-18 DIAGNOSIS — I959 Hypotension, unspecified: Secondary | ICD-10-CM | POA: Diagnosis not present

## 2020-01-18 DIAGNOSIS — J9690 Respiratory failure, unspecified, unspecified whether with hypoxia or hypercapnia: Secondary | ICD-10-CM | POA: Diagnosis not present

## 2020-01-18 DIAGNOSIS — Z992 Dependence on renal dialysis: Secondary | ICD-10-CM | POA: Diagnosis not present

## 2020-01-18 DIAGNOSIS — I5021 Acute systolic (congestive) heart failure: Secondary | ICD-10-CM | POA: Diagnosis not present

## 2020-01-18 DIAGNOSIS — I5043 Acute on chronic combined systolic (congestive) and diastolic (congestive) heart failure: Secondary | ICD-10-CM | POA: Diagnosis not present

## 2020-01-18 DIAGNOSIS — N186 End stage renal disease: Secondary | ICD-10-CM | POA: Diagnosis not present

## 2020-01-18 DIAGNOSIS — N2581 Secondary hyperparathyroidism of renal origin: Secondary | ICD-10-CM | POA: Diagnosis not present

## 2020-01-18 DIAGNOSIS — I482 Chronic atrial fibrillation, unspecified: Secondary | ICD-10-CM | POA: Diagnosis not present

## 2020-01-18 DIAGNOSIS — I5040 Unspecified combined systolic (congestive) and diastolic (congestive) heart failure: Secondary | ICD-10-CM | POA: Diagnosis not present

## 2020-01-18 DIAGNOSIS — I4821 Permanent atrial fibrillation: Secondary | ICD-10-CM | POA: Diagnosis not present

## 2020-01-18 DIAGNOSIS — J9621 Acute and chronic respiratory failure with hypoxia: Secondary | ICD-10-CM | POA: Diagnosis not present

## 2020-01-18 DIAGNOSIS — Z9911 Dependence on respirator [ventilator] status: Secondary | ICD-10-CM | POA: Diagnosis not present

## 2020-01-18 DIAGNOSIS — I313 Pericardial effusion (noninflammatory): Secondary | ICD-10-CM | POA: Diagnosis not present

## 2020-01-18 DIAGNOSIS — I2699 Other pulmonary embolism without acute cor pulmonale: Secondary | ICD-10-CM | POA: Diagnosis not present

## 2020-01-18 DIAGNOSIS — D631 Anemia in chronic kidney disease: Secondary | ICD-10-CM | POA: Diagnosis not present

## 2020-01-18 LAB — RENAL FUNCTION PANEL
Albumin: 3.5 g/dL (ref 3.5–5.0)
Anion gap: 23 — ABNORMAL HIGH (ref 5–15)
BUN: 108 mg/dL — ABNORMAL HIGH (ref 6–20)
CO2: 22 mmol/L (ref 22–32)
Calcium: 8.9 mg/dL (ref 8.9–10.3)
Chloride: 92 mmol/L — ABNORMAL LOW (ref 98–111)
Creatinine, Ser: 5.75 mg/dL — ABNORMAL HIGH (ref 0.61–1.24)
GFR calc Af Amer: 12 mL/min — ABNORMAL LOW (ref >60)
GFR calc non Af Amer: 10 mL/min — ABNORMAL LOW (ref >60)
Glucose, Bld: 128 mg/dL — ABNORMAL HIGH (ref 70–99)
Phosphorus: 3.6 mg/dL (ref 2.5–4.6)
Potassium: 4 mmol/L (ref 3.5–5.1)
Sodium: 137 mmol/L (ref 135–145)

## 2020-01-18 LAB — CBC
HCT: 37.8 % — ABNORMAL LOW (ref 39.0–52.0)
Hemoglobin: 11.5 g/dL — ABNORMAL LOW (ref 13.0–17.0)
MCH: 26.7 pg (ref 26.0–34.0)
MCHC: 30.4 g/dL (ref 30.0–36.0)
MCV: 87.9 fL (ref 80.0–100.0)
Platelets: 312 10*3/uL (ref 150–400)
RBC: 4.3 MIL/uL (ref 4.22–5.81)
RDW: 17.4 % — ABNORMAL HIGH (ref 11.5–15.5)
WBC: 7.6 10*3/uL (ref 4.0–10.5)
nRBC: 1.6 % — ABNORMAL HIGH (ref 0.0–0.2)

## 2020-01-18 NOTE — Consult Note (Signed)
Ref: Jean Rosenthal, MD   Subjective:  Awake. Monitor shows short burst of VT and frequent VPCs.  Objective:  Vital Signs in the last 24 hours:    Physical Exam: BP Readings from Last 1 Encounters:  01/09/20 104/88     Wt Readings from Last 1 Encounters:  12/22/19 68.6 kg    Weight change:  There is no height or weight on file to calculate BMI. HEENT: [place]/AT, Eyes-Brown, PERL, EOMI, Conjunctiva-Pink, Sclera-Non-icteric Neck: No JVD, No bruit, Trachea midline. Lungs:  Clearing, Bilateral. Cardiac:  Irregular rhythm, normal S1 and S2, no S3. II/VI systolic murmur. Abdomen:  Soft, non-tender. BS present. Extremities:  No edema present. No cyanosis. No clubbing. Bilateral feet gangrene CNS: AxOx3, Cranial nerves grossly intact, moves all 4 extremities.  Skin: Warm and dry.   Intake/Output from previous day: No intake/output data recorded.    Lab Results: BMET    Component Value Date/Time   NA 137 01/18/2020 0500   NA 133 (L) 01/16/2020 0550   NA 134 (L) 01/14/2020 1045   K 4.0 01/18/2020 0500   K 4.2 01/16/2020 0550   K 4.0 01/14/2020 1045   CL 92 (L) 01/18/2020 0500   CL 90 (L) 01/16/2020 0550   CL 92 (L) 01/14/2020 1045   CO2 22 01/18/2020 0500   CO2 22 01/16/2020 0550   CO2 24 01/14/2020 1045   GLUCOSE 128 (H) 01/18/2020 0500   GLUCOSE 125 (H) 01/16/2020 0550   GLUCOSE 150 (H) 01/14/2020 1045   BUN 108 (H) 01/18/2020 0500   BUN 119 (H) 01/16/2020 0550   BUN 53 (H) 01/14/2020 1045   CREATININE 5.75 (H) 01/18/2020 0500   CREATININE 6.29 (H) 01/16/2020 0550   CREATININE 4.41 (H) 01/14/2020 1045   CALCIUM 8.9 01/18/2020 0500   CALCIUM 9.0 01/16/2020 0550   CALCIUM 9.0 01/14/2020 1045   CALCIUM 8.6 (L) 12/23/2019 0323   CALCIUM 8.2 (L) 10/21/2010 1420   CALCIUM 8.7 06/24/2009 0542   GFRNONAA 10 (L) 01/18/2020 0500   GFRNONAA 9 (L) 01/16/2020 0550   GFRNONAA 14 (L) 01/14/2020 1045   GFRAA 12 (L) 01/18/2020 0500   GFRAA 11 (L) 01/16/2020 0550   GFRAA 17 (L)  01/14/2020 1045   CBC    Component Value Date/Time   WBC 7.6 01/18/2020 0500   RBC 4.30 01/18/2020 0500   HGB 11.5 (L) 01/18/2020 0500   HGB 12.3 (L) 12/31/2017 1012   HCT 37.8 (L) 01/18/2020 0500   HCT 37.4 (L) 12/31/2017 1012   PLT 312 01/18/2020 0500   PLT 176 12/31/2017 1012   MCV 87.9 01/18/2020 0500   MCV 99 (H) 12/31/2017 1012   MCH 26.7 01/18/2020 0500   MCHC 30.4 01/18/2020 0500   RDW 17.4 (H) 01/18/2020 0500   RDW 18.8 (H) 12/31/2017 1012   LYMPHSABS 1.1 12/17/2019 1504   LYMPHSABS 1.0 12/31/2017 1012   MONOABS 1.7 (H) 12/17/2019 1504   EOSABS 0.5 12/17/2019 1504   EOSABS 0.1 12/31/2017 1012   BASOSABS 0.1 12/17/2019 1504   BASOSABS 0.0 12/31/2017 1012   HEPATIC Function Panel Recent Labs    12/17/19 0017 12/19/19 0446 12/24/19 0539  PROT 8.1 7.4 7.6   HEMOGLOBIN A1C No components found for: HGA1C,  MPG CARDIAC ENZYMES Lab Results  Component Value Date   CKTOTAL 520 (H) 07/16/2012   CKMB 10.7 (HH) 07/16/2012   TROPONINI 0.22 (HH) 11/13/2017   TROPONINI 0.19 (HH) 11/12/2017   TROPONINI 0.26 (HH) 05/19/2017   BNP No results for  input(s): PROBNP in the last 8760 hours. TSH Recent Labs    11/16/19 1730 12/25/19 0350  TSH 7.112* 6.933*   CHOLESTEROL No results for input(s): CHOL in the last 8760 hours.  Scheduled Meds: Continuous Infusions: PRN Meds:.lidocaine  Assessment/Plan: Acute on chronic respiratory failure with hypoxemia Moderate LV systolic dysfunction Mild pericardial effusion ESRD NSVT S/P cardiac arrest x 2 H/O PE Bilateral feet gangrene Anemia of chronic disease Chronic atrial fibrillation  Increase metoprolol dose. Continue amiodarone 200 mg. twice daily.   LOS: 0 days   Time spent including chart review, lab review, examination, discussion with patient and nurse : 30 min   Dixie Dials  MD  01/18/2020, 9:55 AM

## 2020-01-18 NOTE — Progress Notes (Addendum)
Pulmonary Critical Care Medicine [place]   PULMONARY CRITICAL CARE SERVICE  PROGRESS NOTE  Date of Service: 01/18/2020  [name] [name]  PYP:[id number]  DOB: [birth year]   DOA: 12/24/2019  Referring Physician: Merton Border, MD  HPI: [name] [name] is a 54 y.o. male seen for follow up of Acute on Chronic Respiratory Failure.  Patient currently is on pressure support mode on 28% FiO2 12/5  Medications: Reviewed on Rounds  Physical Exam:  Vitals: Temperature is 96.5 pulse 88 respiratory rate 17 blood pressure is 116/78 saturations 100%  Ventilator Settings mode ventilation pressure support FiO2 28% pressure 12 PEEP 5  . General: Comfortable at this time . Eyes: Grossly normal lids, irises & conjunctiva . ENT: grossly tongue is normal . Neck: no obvious mass . Cardiovascular: S1 S2 normal no gallop . Respiratory: No rhonchi no rales are noted at this time . Abdomen: soft . Skin: no rash seen on limited exam . Musculoskeletal: not rigid . Psychiatric:unable to assess . Neurologic: no seizure no involuntary movements         Lab Data:   Basic Metabolic Panel: Recent Labs  Lab 01/11/20 2136 01/13/20 0742 01/13/20 0954 01/14/20 1045 01/16/20 0550 01/18/20 0500  NA  --  131*  --  134* 133* 137  K 3.8 4.7  --  4.0 4.2 4.0  CL  --  93*  --  92* 90* 92*  CO2  --  24  --  24 22 22   GLUCOSE  --  158*  --  150* 125* 128*  BUN  --  53*  --  53* 119* 108*  CREATININE  --  5.42*  --  4.41* 6.29* 5.75*  CALCIUM  --  8.9  --  9.0 9.0 8.9  MG 1.8  --  1.9 2.0 2.4  --   PHOS  --  4.4  --   --  3.8 3.6    ABG: No results for input(s): PHART, PCO2ART, PO2ART, HCO3, O2SAT in the last 168 hours.  Liver Function Tests: Recent Labs  Lab 01/13/20 0742 01/16/20 0550 01/18/20 0500  ALBUMIN 3.0* 3.2* 3.5   No results for input(s): LIPASE, AMYLASE in the last 168 hours. No results for input(s): AMMONIA in the last 168 hours.  CBC: Recent Labs  Lab  01/13/20 0742 01/16/20 0550 01/18/20 0500  WBC 3.6* 6.4 7.6  HGB 10.1* 11.6* 11.5*  HCT 33.0* 37.6* 37.8*  MCV 88.7 88.1 87.9  PLT 228 277 312    Cardiac Enzymes: No results for input(s): CKTOTAL, CKMB, CKMBINDEX, TROPONINI in the last 168 hours.  BNP (last 3 results) Recent Labs    11/16/19 1356  BNP 962.8*    ProBNP (last 3 results) No results for input(s): PROBNP in the last 8760 hours.  Radiological Exams: No results found.  Assessment/Plan Active Problems:   Acute on chronic respiratory failure with hypoxia (HCC)   End stage renal disease on dialysis (HCC)   Acute on chronic systolic and diastolic heart failure, NYHA class 4 (HCC)   Chronic atrial fibrillation (HCC)   Acute pulmonary embolism without acute cor pulmonale ([place])   1. Acute on chronic respiratory failure hypoxemia lengthy discussion during rounds and were going to try him on T collar direct and also use the PMV so that he can actually hear his voice in the possibly speak to his family.  If he is able to tolerate this well go straight to the T collar and possibly capping. 2. End-stage renal  disease on hemodialysis continue with supportive care 3. Acute on chronic systolic heart failure right now is compensated we will monitor 4. Chronic atrial fibrillation rate controlled 5. Acute pulmonary embolism treated   I have personally seen and evaluated the patient, evaluated laboratory and imaging results, formulated the assessment and plan and placed orders. The Patient requires high complexity decision making with multiple systems involvement.  Rounds were done with the Respiratory Therapy Director and Staff therapists and discussed with nursing staff also.  Allyne Gee, MD [facility] Pulmonary Critical Care Medicine Sleep Medicine

## 2020-01-18 NOTE — Progress Notes (Signed)
[facility]  ROUNDING NOTE   Subjective:  Patient seen and evaluated during dialysis treatment. Appears more stable today as he is not having runs of ventricular tachycardia. Patient on amiodarone 200 mg twice daily and metoprolol dose also to be increased.  Objective:  Vital signs in last 24 hours:  Temperature 96.5 pulse 88 respiration 17 blood pressure 116/98  Physical Exam: General: Chronically ill-appearing  Head: Normocephalic, atraumatic. Moist oral mucosal membranes  Eyes: Anicteric  Neck: Tracheostomy present, neck swelling noted  Lungs:  Scattered rhonchi  Heart: S1S2 no rubs irregular  Abdomen:  Soft, nontender, bowel sounds present, distended  Extremities: Trace peripheral edema.  Neurologic: Awake, alert, following commands  Skin: Right and left toes covered  Access: Right IJ temporary dialysis catheter    Basic Metabolic Panel: Recent Labs  Lab 01/11/20 2136 01/13/20 0742 01/13/20 0742 01/13/20 0954 01/14/20 1045 01/16/20 0550 01/18/20 0500  NA  --  131*  --   --  134* 133* 137  K 3.8 4.7  --   --  4.0 4.2 4.0  CL  --  93*  --   --  92* 90* 92*  CO2  --  24  --   --  24 22 22   GLUCOSE  --  158*  --   --  150* 125* 128*  BUN  --  53*  --   --  53* 119* 108*  CREATININE  --  5.42*  --   --  4.41* 6.29* 5.75*  CALCIUM  --  8.9   < >  --  9.0 9.0 8.9  MG 1.8  --   --  1.9 2.0 2.4  --   PHOS  --  4.4  --   --   --  3.8 3.6   < > = values in this interval not displayed.    Liver Function Tests: Recent Labs  Lab 01/13/20 0742 01/16/20 0550 01/18/20 0500  ALBUMIN 3.0* 3.2* 3.5   No results for input(s): LIPASE, AMYLASE in the last 168 hours. No results for input(s): AMMONIA in the last 168 hours.  CBC: Recent Labs  Lab 01/13/20 0742 01/16/20 0550 01/18/20 0500  WBC 3.6* 6.4 7.6  HGB 10.1* 11.6* 11.5*  HCT 33.0* 37.6* 37.8*  MCV 88.7 88.1 87.9  PLT 228 277 312    Cardiac Enzymes: No results for input(s): CKTOTAL, CKMB,  CKMBINDEX, TROPONINI in the last 168 hours.  BNP: Invalid input(s): POCBNP  CBG: No results for input(s): GLUCAP in the last 168 hours.  Microbiology: Results for orders placed or performed during the hospital encounter of 11/16/19  SARS Coronavirus 2 by RT PCR (hospital order, performed in [facility] hospital lab) Nasopharyngeal Nasopharyngeal Swab     Status: None   Collection Time: 11/16/19 11:41 AM   Specimen: Nasopharyngeal Swab  Result Value Ref Range Status   SARS Coronavirus 2 NEGATIVE NEGATIVE Final    Comment: (NOTE) SARS-CoV-2 target nucleic acids are NOT DETECTED. The SARS-CoV-2 RNA is generally detectable in upper and lower respiratory specimens during the acute phase of infection. The lowest concentration of SARS-CoV-2 viral copies this assay can detect is 250 copies / mL. A negative result does not preclude SARS-CoV-2 infection and should not be used as the sole basis for treatment or other patient management decisions.  A negative result may occur with improper specimen collection / handling, submission of specimen other than nasopharyngeal swab, presence of viral mutation(s) within the areas targeted by this assay, and  inadequate number of viral copies (<250 copies / mL). A negative result must be combined with clinical observations, patient history, and epidemiological information. Fact Sheet for Patients:   StrictlyIdeas.no Fact Sheet for Healthcare Providers: BankingDealers.co.za This test is not yet approved or cleared  by the [place] FDA and has been authorized for detection and/or diagnosis of SARS-CoV-2 by FDA under an Emergency Use Authorization (EUA).  This EUA will remain in effect (meaning this test can be used) for the duration of the COVID-19 declaration under Section 564(b)(1) of the Act, 21 U.S.C. section 360bbb-3(b)(1), unless the authorization is terminated or revoked sooner. Performed at  [facility], [place] [address] [place], [place] [address]   Culture, blood (routine x 2)     Status: None   Collection Time: 11/16/19  6:03 PM   Specimen: BLOOD RIGHT HAND  Result Value Ref Range Status   Specimen Description BLOOD RIGHT HAND  Final   Special Requests   Final    BOTTLES DRAWN AEROBIC AND ANAEROBIC Blood Culture results may not be optimal due to an inadequate volume of blood received in culture bottles   Culture   Final    NO GROWTH 5 DAYS Performed at [facility], [place] [address], [place], [place] [address]    Report Status 11/21/2019 FINAL  Final  MRSA PCR Screening     Status: None   Collection Time: 11/16/19  6:05 PM   Specimen: Nasal Mucosa; Nasopharyngeal  Result Value Ref Range Status   MRSA by PCR NEGATIVE NEGATIVE Final    Comment:        The GeneXpert MRSA Assay (FDA approved for NASAL specimens only), is one component of a comprehensive MRSA colonization surveillance program. It is not intended to diagnose MRSA infection nor to guide or monitor treatment for MRSA infections. Performed at [facility], [place] [address] [place], [place] [address]   Culture, blood (routine x 2)     Status: None   Collection Time: 11/16/19  6:08 PM   Specimen: BLOOD RIGHT HAND  Result Value Ref Range Status   Specimen Description BLOOD RIGHT HAND  Final   Special Requests   Final    BOTTLES DRAWN AEROBIC ONLY Blood Culture results may not be optimal due to an inadequate volume of blood received in culture bottles   Culture   Final    NO GROWTH 5 DAYS Performed at [facility], [place] [address] [place], [place] [address]    Report Status 11/21/2019 FINAL  Final  Culture, respiratory (non-expectorated)     Status: None   Collection Time: 11/18/19 12:28 PM   Specimen: Tracheal Aspirate; Respiratory  Result Value Ref Range Status   Specimen Description TRACHEAL ASPIRATE  Final   Special Requests NONE  Final   Gram Stain   Final    ABUNDANT WBC  PRESENT,BOTH PMN AND MONONUCLEAR ABUNDANT GRAM POSITIVE COCCI ABUNDANT GRAM NEGATIVE RODS FEW GRAM VARIABLE ROD    Culture   Final    ABUNDANT HAEMOPHILUS INFLUENZAE BETA LACTAMASE POSITIVE Performed at [facility], [place] [address], [place],  [address]    Report Status 11/20/2019 FINAL  Final  Surgical pcr screen     Status: Abnormal   Collection Time: 11/24/19  7:53 AM   Specimen: Nasal Mucosa; Nasal Swab  Result Value Ref Range Status   MRSA, PCR NEGATIVE NEGATIVE Final   Staphylococcus aureus POSITIVE (A) NEGATIVE Final    Comment: (NOTE)  The Xpert SA Assay (FDA approved for NASAL specimens in patients 72 years of age and older), is one component of a comprehensive surveillance program. It is not intended to diagnose infection nor to guide or monitor treatment. Performed at [facility], [place] [address], [place], [place] [address]   Culture, blood (routine x 2)     Status: None   Collection Time: 11/25/19  3:30 PM   Specimen: BLOOD  Result Value Ref Range Status   Specimen Description BLOOD RIGHT ANTECUBITAL  Final   Special Requests   Final    BOTTLES DRAWN AEROBIC ONLY Blood Culture results may not be optimal due to an inadequate volume of blood received in culture bottles   Culture   Final    NO GROWTH 5 DAYS Performed at [facility], [place] [address], [place], [place] [address]    Report Status 11/30/2019 FINAL  Final  Culture, blood (routine x 2)     Status: None   Collection Time: 11/25/19  3:30 PM   Specimen: BLOOD RIGHT HAND  Result Value Ref Range Status   Specimen Description BLOOD RIGHT HAND  Final   Special Requests   Final    BOTTLES DRAWN AEROBIC AND ANAEROBIC Blood Culture results may not be optimal due to an inadequate volume of blood received in culture bottles   Culture   Final    NO GROWTH 5 DAYS Performed at [facility], [place] [address] [place], [place] [address]    Report Status 11/30/2019 FINAL  Final  Culture,  respiratory (non-expectorated)     Status: None   Collection Time: 11/25/19  4:35 PM   Specimen: Tracheal Aspirate; Respiratory  Result Value Ref Range Status   Specimen Description TRACHEAL ASPIRATE  Final   Special Requests NONE  Final   Gram Stain   Final    ABUNDANT WBC PRESENT,BOTH PMN AND MONONUCLEAR FEW GRAM POSITIVE COCCI Performed at [facility], [place] [address] [place], [place] [address]    Culture FEW STAPHYLOCOCCUS EPIDERMIDIS  Final   Report Status 11/28/2019 FINAL  Final   Organism ID, Bacteria STAPHYLOCOCCUS EPIDERMIDIS  Final      Susceptibility   Staphylococcus epidermidis - MIC*    CIPROFLOXACIN <=0.5 SENSITIVE Sensitive     ERYTHROMYCIN >=8 RESISTANT Resistant     GENTAMICIN <=0.5 SENSITIVE Sensitive     OXACILLIN >=4 RESISTANT Resistant     TETRACYCLINE <=1 SENSITIVE Sensitive     VANCOMYCIN 2 SENSITIVE Sensitive     TRIMETH/SULFA <=10 SENSITIVE Sensitive     CLINDAMYCIN >=8 RESISTANT Resistant     RIFAMPIN <=0.5 SENSITIVE Sensitive     Inducible Clindamycin NEGATIVE Sensitive     * FEW STAPHYLOCOCCUS EPIDERMIDIS  Culture, respiratory (non-expectorated)     Status: None   Collection Time: 12/15/19  4:12 PM   Specimen: Tracheal Aspirate; Respiratory  Result Value Ref Range Status   Specimen Description TRACHEAL ASPIRATE  Final   Special Requests NONE  Final   Gram Stain   Final    MODERATE WBC PRESENT, PREDOMINANTLY PMN MODERATE GRAM POSITIVE COCCI IN PAIRS IN CLUSTERS Performed at [facility], [address], [place], [place] [address]    Culture MODERATE STAPHYLOCOCCUS AUREUS  Final   Report Status 12/17/2019 FINAL  Final   Organism ID, Bacteria STAPHYLOCOCCUS AUREUS  Final      Susceptibility   Staphylococcus aureus - MIC*    CIPROFLOXACIN <=0.5 SENSITIVE Sensitive  ERYTHROMYCIN <=0.25 SENSITIVE Sensitive     GENTAMICIN <=0.5 SENSITIVE Sensitive     OXACILLIN <=0.25 SENSITIVE Sensitive     TETRACYCLINE <=1 SENSITIVE Sensitive      VANCOMYCIN 1 SENSITIVE Sensitive     TRIMETH/SULFA <=10 SENSITIVE Sensitive     CLINDAMYCIN <=0.25 SENSITIVE Sensitive     RIFAMPIN <=0.5 SENSITIVE Sensitive     Inducible Clindamycin NEGATIVE Sensitive     * MODERATE STAPHYLOCOCCUS AUREUS  MRSA PCR Screening     Status: None   Collection Time: 12/17/19 10:25 AM   Specimen: Nasopharyngeal  Result Value Ref Range Status   MRSA by PCR NEGATIVE NEGATIVE Final    Comment:        The GeneXpert MRSA Assay (FDA approved for NASAL specimens only), is one component of a comprehensive MRSA colonization surveillance program. It is not intended to diagnose MRSA infection nor to guide or monitor treatment for MRSA infections. Performed at [facility], [place] [address], [place], [place] [address]   Culture, blood (routine x 2)     Status: None   Collection Time: 12/17/19  3:15 PM   Specimen: BLOOD  Result Value Ref Range Status   Specimen Description BLOOD RIGHT ANTECUBITAL  Final   Special Requests   Final    BOTTLES DRAWN AEROBIC AND ANAEROBIC Blood Culture results may not be optimal due to an inadequate volume of blood received in culture bottles   Culture   Final    NO GROWTH 5 DAYS Performed at [facility], [place] [address], [place],  [address]    Report Status 12/22/2019 FINAL  Final  Culture, blood (routine x 2)     Status: None   Collection Time: 12/17/19  3:20 PM   Specimen: BLOOD RIGHT HAND  Result Value Ref Range Status   Specimen Description BLOOD RIGHT HAND  Final   Special Requests   Final    BOTTLES DRAWN AEROBIC ONLY Blood Culture results may not be optimal due to an inadequate volume of blood received in culture bottles   Culture   Final    NO GROWTH 5 DAYS Performed at [facility], [place] [address], [place],  [address]    Report Status 12/22/2019 FINAL  Final    Coagulation Studies: No results for input(s): LABPROT, INR in the last 72 hours.  Urinalysis: No results for input(s):  COLORURINE, LABSPEC, PHURINE, GLUCOSEU, HGBUR, BILIRUBINUR, KETONESUR, PROTEINUR, UROBILINOGEN, NITRITE, LEUKOCYTESUR in the last 72 hours.  Invalid input(s): APPERANCEUR    Imaging: No results found.   Medications:       Assessment/ Plan:  54 y.o. male with a PMHx of recent cardiac arrest status post hypothermia protocol, ESRD on HD MWF, anemia of chronic kidney disease, secondary hyperparathyroidism, history of polysubstance abuse, hypertension,, chronic systolic heart failure, medical nonadherence who was admitted to [facility] on 12/24/2019 for ongoing management of acute respiratory failure.  1.  ESRD on HD MWF.  Patient seen and evaluated during hemodialysis.  Tolerating well.  We will plan to complete dialysis treatment today.  Tolerating treatments much better today as he is having less ventricular tachycardia.  2.  Anemia of chronic kidney disease.  Hemoglobin 11.5.  Maintain the patient on Epogen.  3.  Secondary hyperparathyroidism.  Phosphorus 3.6 and acceptable.  4.  Hyperkalemia.  Resolved.  5.  Hypotension.  Improved.  No longer requiring norepinephrine during dialysis treatment.   LOS: 0 Jayleana Colberg 2/3/202110:40 AM

## 2020-01-19 DIAGNOSIS — Z992 Dependence on renal dialysis: Secondary | ICD-10-CM | POA: Diagnosis not present

## 2020-01-19 DIAGNOSIS — I5040 Unspecified combined systolic (congestive) and diastolic (congestive) heart failure: Secondary | ICD-10-CM | POA: Diagnosis not present

## 2020-01-19 DIAGNOSIS — N186 End stage renal disease: Secondary | ICD-10-CM | POA: Diagnosis not present

## 2020-01-19 DIAGNOSIS — I482 Chronic atrial fibrillation, unspecified: Secondary | ICD-10-CM | POA: Diagnosis not present

## 2020-01-19 DIAGNOSIS — J9621 Acute and chronic respiratory failure with hypoxia: Secondary | ICD-10-CM | POA: Diagnosis not present

## 2020-01-19 DIAGNOSIS — J9690 Respiratory failure, unspecified, unspecified whether with hypoxia or hypercapnia: Secondary | ICD-10-CM | POA: Diagnosis not present

## 2020-01-19 DIAGNOSIS — I2699 Other pulmonary embolism without acute cor pulmonale: Secondary | ICD-10-CM | POA: Diagnosis not present

## 2020-01-19 DIAGNOSIS — I5043 Acute on chronic combined systolic (congestive) and diastolic (congestive) heart failure: Secondary | ICD-10-CM | POA: Diagnosis not present

## 2020-01-19 DIAGNOSIS — Z9911 Dependence on respirator [ventilator] status: Secondary | ICD-10-CM | POA: Diagnosis not present

## 2020-01-19 LAB — BLOOD GAS, ARTERIAL
Acid-base deficit: 0.5 mmol/L (ref 0.0–2.0)
Bicarbonate: 24.9 mmol/L (ref 20.0–28.0)
FIO2: 35
O2 Saturation: 85.5 %
Patient temperature: 36.4
pCO2 arterial: 48.8 mmHg — ABNORMAL HIGH (ref 32.0–48.0)
pH, Arterial: 7.324 — ABNORMAL LOW (ref 7.350–7.450)
pO2, Arterial: 55.6 mmHg — ABNORMAL LOW (ref 83.0–108.0)

## 2020-01-19 NOTE — Progress Notes (Signed)
Pulmonary Critical Care Medicine [facility]   PULMONARY CRITICAL CARE SERVICE  PROGRESS NOTE  Date of Service: 01/19/2020  Irvine Glorioso  FYB:[id number]  DOB: 03-04-1966   DOA: 12/24/2019  Referring Physician: Merton Border, MD  HPI: Endi Lagman is a 54 y.o. male seen for follow up of Acute on Chronic Respiratory Failure.  Patient is on T collar right now has been on 35% FiO2 good saturations are noted  Medications: Reviewed on Rounds  Physical Exam:  Vitals: Temperature is 98.6 pulse 90 respiratory 21 blood pressure is 123/45 saturations 99%  Ventilator Settings off the ventilator on T collar with an FiO2 of 35%  . General: Comfortable at this time . Eyes: Grossly normal lids, irises & conjunctiva . ENT: grossly tongue is normal . Neck: no obvious mass . Cardiovascular: S1 S2 normal no gallop . Respiratory: No rhonchi no rales are noted at this time . Abdomen: soft . Skin: no rash seen on limited exam . Musculoskeletal: not rigid . Psychiatric:unable to assess . Neurologic: no seizure no involuntary movements         Lab Data:   Basic Metabolic Panel: Recent Labs  Lab 01/13/20 0742 01/13/20 0954 01/14/20 1045 01/16/20 0550 01/18/20 0500  NA 131*  --  134* 133* 137  K 4.7  --  4.0 4.2 4.0  CL 93*  --  92* 90* 92*  CO2 24  --  24 22 22   GLUCOSE 158*  --  150* 125* 128*  BUN 53*  --  53* 119* 108*  CREATININE 5.42*  --  4.41* 6.29* 5.75*  CALCIUM 8.9  --  9.0 9.0 8.9  MG  --  1.9 2.0 2.4  --   PHOS 4.4  --   --  3.8 3.6    ABG: No results for input(s): PHART, PCO2ART, PO2ART, HCO3, O2SAT in the last 168 hours.  Liver Function Tests: Recent Labs  Lab 01/13/20 0742 01/16/20 0550 01/18/20 0500  ALBUMIN 3.0* 3.2* 3.5   No results for input(s): LIPASE, AMYLASE in the last 168 hours. No results for input(s): AMMONIA in the last 168 hours.  CBC: Recent Labs  Lab 01/13/20 0742 01/16/20 0550 01/18/20 0500  WBC 3.6* 6.4 7.6  HGB  10.1* 11.6* 11.5*  HCT 33.0* 37.6* 37.8*  MCV 88.7 88.1 87.9  PLT 228 277 312    Cardiac Enzymes: No results for input(s): CKTOTAL, CKMB, CKMBINDEX, TROPONINI in the last 168 hours.  BNP (last 3 results) Recent Labs    11/16/19 1356  BNP 962.8*    ProBNP (last 3 results) No results for input(s): PROBNP in the last 8760 hours.  Radiological Exams: No results found.  Assessment/Plan Active Problems:   Acute on chronic respiratory failure with hypoxia (HCC)   End stage renal disease on dialysis (HCC)   Acute on chronic systolic and diastolic heart failure, NYHA class 4 (HCC)   Chronic atrial fibrillation (HCC)   Acute pulmonary embolism without acute cor pulmonale ([place])   1. Acute on chronic respiratory failure hypoxia plan is to continue with T collar wean on 35% FiO2 if he continues to do well with consider changing the trach out and beginning capping the 2. End-stage renal disease on hemodialysis 3. Acute on chronic systolic heart failure compensated 4. Chronic atrial fibrillation rate controlled 5. Acute pulmonary embolism note changes are noted treated   I have personally seen and evaluated the patient, evaluated laboratory and imaging results, formulated the assessment and plan and placed  orders. The Patient requires high complexity decision making with multiple systems involvement.  Rounds were done with the Respiratory Therapy Director and Staff therapists and discussed with nursing staff also.  Allyne Gee, MD [facility] Pulmonary Critical Care Medicine Sleep Medicine

## 2020-01-20 DIAGNOSIS — Z9911 Dependence on respirator [ventilator] status: Secondary | ICD-10-CM | POA: Diagnosis not present

## 2020-01-20 DIAGNOSIS — I5043 Acute on chronic combined systolic (congestive) and diastolic (congestive) heart failure: Secondary | ICD-10-CM | POA: Diagnosis not present

## 2020-01-20 DIAGNOSIS — I2699 Other pulmonary embolism without acute cor pulmonale: Secondary | ICD-10-CM | POA: Diagnosis not present

## 2020-01-20 DIAGNOSIS — D631 Anemia in chronic kidney disease: Secondary | ICD-10-CM | POA: Diagnosis not present

## 2020-01-20 DIAGNOSIS — I482 Chronic atrial fibrillation, unspecified: Secondary | ICD-10-CM | POA: Diagnosis not present

## 2020-01-20 DIAGNOSIS — N186 End stage renal disease: Secondary | ICD-10-CM | POA: Diagnosis not present

## 2020-01-20 DIAGNOSIS — J9690 Respiratory failure, unspecified, unspecified whether with hypoxia or hypercapnia: Secondary | ICD-10-CM | POA: Diagnosis not present

## 2020-01-20 DIAGNOSIS — J9621 Acute and chronic respiratory failure with hypoxia: Secondary | ICD-10-CM | POA: Diagnosis not present

## 2020-01-20 DIAGNOSIS — I5040 Unspecified combined systolic (congestive) and diastolic (congestive) heart failure: Secondary | ICD-10-CM | POA: Diagnosis not present

## 2020-01-20 DIAGNOSIS — I959 Hypotension, unspecified: Secondary | ICD-10-CM | POA: Diagnosis not present

## 2020-01-20 DIAGNOSIS — Z992 Dependence on renal dialysis: Secondary | ICD-10-CM | POA: Diagnosis not present

## 2020-01-20 DIAGNOSIS — N2581 Secondary hyperparathyroidism of renal origin: Secondary | ICD-10-CM | POA: Diagnosis not present

## 2020-01-20 LAB — RENAL FUNCTION PANEL
Albumin: 3.6 g/dL (ref 3.5–5.0)
Anion gap: 19 — ABNORMAL HIGH (ref 5–15)
BUN: 143 mg/dL — ABNORMAL HIGH (ref 6–20)
CO2: 23 mmol/L (ref 22–32)
Calcium: 9.1 mg/dL (ref 8.9–10.3)
Chloride: 94 mmol/L — ABNORMAL LOW (ref 98–111)
Creatinine, Ser: 6.06 mg/dL — ABNORMAL HIGH (ref 0.61–1.24)
GFR calc Af Amer: 11 mL/min — ABNORMAL LOW (ref >60)
GFR calc non Af Amer: 10 mL/min — ABNORMAL LOW (ref >60)
Glucose, Bld: 115 mg/dL — ABNORMAL HIGH (ref 70–99)
Phosphorus: 4.1 mg/dL (ref 2.5–4.6)
Potassium: 4.5 mmol/L (ref 3.5–5.1)
Sodium: 136 mmol/L (ref 135–145)

## 2020-01-20 LAB — CBC
HCT: 41.5 % (ref 39.0–52.0)
Hemoglobin: 12.7 g/dL — ABNORMAL LOW (ref 13.0–17.0)
MCH: 27.5 pg (ref 26.0–34.0)
MCHC: 30.6 g/dL (ref 30.0–36.0)
MCV: 90 fL (ref 80.0–100.0)
Platelets: 270 10*3/uL (ref 150–400)
RBC: 4.61 MIL/uL (ref 4.22–5.81)
RDW: 18.6 % — ABNORMAL HIGH (ref 11.5–15.5)
WBC: 8.3 10*3/uL (ref 4.0–10.5)
nRBC: 6.2 % — ABNORMAL HIGH (ref 0.0–0.2)

## 2020-01-20 LAB — PATHOLOGIST SMEAR REVIEW

## 2020-01-20 NOTE — Progress Notes (Signed)
[facility]  ROUNDING NOTE   Subjective:  Patient seen and evaluated at bedside. Due for dialysis treatment today. Still on the ventilator at the moment. Otherwise appears to be resting comfortably.  Objective:  Vital signs in last 24 hours:  Temperature 98.6 pulse 84 respirations 21 blood pressure 132/85  Physical Exam: General: Chronically ill-appearing  Head: Normocephalic, atraumatic. Moist oral mucosal membranes  Eyes: Anicteric  Neck: Tracheostomy present  Lungs:  Scattered rhonchi  Heart: S1S2 no rubs irregular  Abdomen:  Soft, nontender, bowel sounds present, distended  Extremities: Trace peripheral edema.  Neurologic: Awake, alert, following commands  Skin: Warm/dry, normal skin turgor  Access: Right IJ temporary dialysis catheter    Basic Metabolic Panel: Recent Labs  Lab 01/13/20 0954 01/14/20 1045 01/14/20 1045 01/16/20 0550 01/18/20 0500 01/20/20 0544  NA  --  134*  --  133* 137 136  K  --  4.0  --  4.2 4.0 4.5  CL  --  92*  --  90* 92* 94*  CO2  --  24  --  22 22 23   GLUCOSE  --  150*  --  125* 128* 115*  BUN  --  53*  --  119* 108* 143*  CREATININE  --  4.41*  --  6.29* 5.75* 6.06*  CALCIUM  --  9.0   < > 9.0 8.9 9.1  MG 1.9 2.0  --  2.4  --   --   PHOS  --   --   --  3.8 3.6 4.1   < > = values in this interval not displayed.    Liver Function Tests: Recent Labs  Lab 01/16/20 0550 01/18/20 0500 01/20/20 0544  ALBUMIN 3.2* 3.5 3.6   No results for input(s): LIPASE, AMYLASE in the last 168 hours. No results for input(s): AMMONIA in the last 168 hours.  CBC: Recent Labs  Lab 01/16/20 0550 01/18/20 0500 01/20/20 0544  WBC 6.4 7.6 8.3  HGB 11.6* 11.5* 12.7*  HCT 37.6* 37.8* 41.5  MCV 88.1 87.9 90.0  PLT 277 312 270    Cardiac Enzymes: No results for input(s): CKTOTAL, CKMB, CKMBINDEX, TROPONINI in the last 168 hours.  BNP: Invalid input(s): POCBNP  CBG: No results for input(s): GLUCAP in the last 168  hours.  Microbiology: Results for orders placed or performed during the hospital encounter of 11/16/19  SARS Coronavirus 2 by RT PCR (hospital order, performed in [facility] hospital lab) Nasopharyngeal Nasopharyngeal Swab     Status: None   Collection Time: 11/16/19 11:41 AM   Specimen: Nasopharyngeal Swab  Result Value Ref Range Status   SARS Coronavirus 2 NEGATIVE NEGATIVE Final    Comment: (NOTE) SARS-CoV-2 target nucleic acids are NOT DETECTED. The SARS-CoV-2 RNA is generally detectable in upper and lower respiratory specimens during the acute phase of infection. The lowest concentration of SARS-CoV-2 viral copies this assay can detect is 250 copies / mL. A negative result does not preclude SARS-CoV-2 infection and should not be used as the sole basis for treatment or other patient management decisions.  A negative result may occur with improper specimen collection / handling, submission of specimen other than nasopharyngeal swab, presence of viral mutation(s) within the areas targeted by this assay, and inadequate number of viral copies (<250 copies / mL). A negative result must be combined with clinical observations, patient history, and epidemiological information. Fact Sheet for Patients:   StrictlyIdeas.no Fact Sheet for Healthcare Providers: BankingDealers.co.za This test is not yet approved  or cleared  by the [place] and has been authorized for detection and/or diagnosis of SARS-CoV-2 by FDA under an Emergency Use Authorization (EUA).  This EUA will remain in effect (meaning this test can be used) for the duration of the COVID-19 declaration under Section 564(b)(1) of the Act, 21 U.S.C. section 360bbb-3(b)(1), unless the authorization is terminated or revoked sooner. Performed at [facility], [place] [address], [place], [place] [address]   Culture, blood (routine x 2)     Status: None   Collection Time:  11/16/19  6:03 PM   Specimen: BLOOD RIGHT HAND  Result Value Ref Range Status   Specimen Description BLOOD RIGHT HAND  Final   Special Requests   Final    BOTTLES DRAWN AEROBIC AND ANAEROBIC Blood Culture results may not be optimal due to an inadequate volume of blood received in culture bottles   Culture   Final    NO GROWTH 5 DAYS Performed at [facility], [place] [address], [place], [place] [address]    Report Status 11/21/2019 FINAL  Final  MRSA PCR Screening     Status: None   Collection Time: 11/16/19  6:05 PM   Specimen: Nasal Mucosa; Nasopharyngeal  Result Value Ref Range Status   MRSA by PCR NEGATIVE NEGATIVE Final    Comment:        The GeneXpert MRSA Assay (FDA approved for NASAL specimens only), is one component of a comprehensive MRSA colonization surveillance program. It is not intended to diagnose MRSA infection nor to guide or monitor treatment for MRSA infections. Performed at [facility], [place] [address], [place], [place] [address]   Culture, blood (routine x 2)     Status: None   Collection Time: 11/16/19  6:08 PM   Specimen: BLOOD RIGHT HAND  Result Value Ref Range Status   Specimen Description BLOOD RIGHT HAND  Final   Special Requests   Final    BOTTLES DRAWN AEROBIC ONLY Blood Culture results may not be optimal due to an inadequate volume of blood received in culture bottles   Culture   Final    NO GROWTH 5 DAYS Performed at [facility], [place] [address] [place], [place] [address]    Report Status 11/21/2019 FINAL  Final  Culture, respiratory (non-expectorated)     Status: None   Collection Time: 11/18/19 12:28 PM   Specimen: Tracheal Aspirate; Respiratory  Result Value Ref Range Status   Specimen Description TRACHEAL ASPIRATE  Final   Special Requests NONE  Final   Gram Stain   Final    ABUNDANT WBC PRESENT,BOTH PMN AND MONONUCLEAR ABUNDANT GRAM POSITIVE COCCI ABUNDANT GRAM NEGATIVE RODS FEW GRAM VARIABLE ROD    Culture    Final    ABUNDANT HAEMOPHILUS INFLUENZAE BETA LACTAMASE POSITIVE Performed at [facility], [place] [address] [place],  [address]    Report Status 11/20/2019 FINAL  Final  Surgical pcr screen     Status: Abnormal   Collection Time: 11/24/19  7:53 AM   Specimen: Nasal Mucosa; Nasal Swab  Result Value Ref Range Status   MRSA, PCR NEGATIVE NEGATIVE Final   Staphylococcus aureus POSITIVE (A) NEGATIVE Final    Comment: (NOTE) The Xpert SA Assay (FDA approved for NASAL specimens in patients 35 years of age and older), is one component of a comprehensive surveillance program. It is not intended to diagnose infection nor to guide or monitor treatment. Performed at [facility], [place] [address], [place], [place] [address]   Culture, blood (routine x 2)     Status: None   Collection Time: 11/25/19  3:30 PM   Specimen: BLOOD  Result Value Ref Range Status   Specimen Description BLOOD RIGHT ANTECUBITAL  Final   Special Requests   Final    BOTTLES DRAWN AEROBIC ONLY Blood Culture results may not be optimal due to an inadequate volume of blood received in culture bottles   Culture   Final    NO GROWTH 5 DAYS Performed at [facility], [place] [address], [place], [place] [address]    Report Status 11/30/2019 FINAL  Final  Culture, blood (routine x 2)     Status: None   Collection Time: 11/25/19  3:30 PM   Specimen: BLOOD RIGHT HAND  Result Value Ref Range Status   Specimen Description BLOOD RIGHT HAND  Final   Special Requests   Final    BOTTLES DRAWN AEROBIC AND ANAEROBIC Blood Culture results may not be optimal due to an inadequate volume of blood received in culture bottles   Culture   Final    NO GROWTH 5 DAYS Performed at [facility], [place] [address], [place], [place] [address]    Report Status 11/30/2019 FINAL  Final  Culture, respiratory (non-expectorated)     Status: None   Collection Time: 11/25/19  4:35 PM   Specimen: Tracheal Aspirate; Respiratory   Result Value Ref Range Status   Specimen Description TRACHEAL ASPIRATE  Final   Special Requests NONE  Final   Gram Stain   Final    ABUNDANT WBC PRESENT,BOTH PMN AND MONONUCLEAR FEW GRAM POSITIVE COCCI Performed at [facility], [place] [address] [place], [place] [address]    Culture FEW STAPHYLOCOCCUS EPIDERMIDIS  Final   Report Status 11/28/2019 FINAL  Final   Organism ID, Bacteria STAPHYLOCOCCUS EPIDERMIDIS  Final      Susceptibility   Staphylococcus epidermidis - MIC*    CIPROFLOXACIN <=0.5 SENSITIVE Sensitive     ERYTHROMYCIN >=8 RESISTANT Resistant     GENTAMICIN <=0.5 SENSITIVE Sensitive     OXACILLIN >=4 RESISTANT Resistant     TETRACYCLINE <=1 SENSITIVE Sensitive     VANCOMYCIN 2 SENSITIVE Sensitive     TRIMETH/SULFA <=10 SENSITIVE Sensitive     CLINDAMYCIN >=8 RESISTANT Resistant     RIFAMPIN <=0.5 SENSITIVE Sensitive     Inducible Clindamycin NEGATIVE Sensitive     * FEW STAPHYLOCOCCUS EPIDERMIDIS  Culture, respiratory (non-expectorated)     Status: None   Collection Time: 12/15/19  4:12 PM   Specimen: Tracheal Aspirate; Respiratory  Result Value Ref Range Status   Specimen Description TRACHEAL ASPIRATE  Final   Special Requests NONE  Final   Gram Stain   Final    MODERATE WBC PRESENT, PREDOMINANTLY PMN MODERATE GRAM POSITIVE COCCI IN PAIRS IN CLUSTERS Performed at [facility], [address], [place], [place] [address]    Culture MODERATE STAPHYLOCOCCUS AUREUS  Final   Report Status 12/17/2019 FINAL  Final   Organism ID, Bacteria STAPHYLOCOCCUS AUREUS  Final      Susceptibility   Staphylococcus aureus - MIC*    CIPROFLOXACIN <=0.5 SENSITIVE Sensitive     ERYTHROMYCIN <=0.25 SENSITIVE Sensitive     GENTAMICIN <=0.5 SENSITIVE Sensitive     OXACILLIN <=0.25 SENSITIVE Sensitive     TETRACYCLINE <=1 SENSITIVE Sensitive     VANCOMYCIN 1 SENSITIVE Sensitive     TRIMETH/SULFA <=  10 SENSITIVE Sensitive     CLINDAMYCIN <=0.25 SENSITIVE Sensitive      RIFAMPIN <=0.5 SENSITIVE Sensitive     Inducible Clindamycin NEGATIVE Sensitive     * MODERATE STAPHYLOCOCCUS AUREUS  MRSA PCR Screening     Status: None   Collection Time: 12/17/19 10:25 AM   Specimen: Nasopharyngeal  Result Value Ref Range Status   MRSA by PCR NEGATIVE NEGATIVE Final    Comment:        The GeneXpert MRSA Assay (FDA approved for NASAL specimens only), is one component of a comprehensive MRSA colonization surveillance program. It is not intended to diagnose MRSA infection nor to guide or monitor treatment for MRSA infections. Performed at [facility], [place] [address] [place], [place] [address]   Culture, blood (routine x 2)     Status: None   Collection Time: 12/17/19  3:15 PM   Specimen: BLOOD  Result Value Ref Range Status   Specimen Description BLOOD RIGHT ANTECUBITAL  Final   Special Requests   Final    BOTTLES DRAWN AEROBIC AND ANAEROBIC Blood Culture results may not be optimal due to an inadequate volume of blood received in culture bottles   Culture   Final    NO GROWTH 5 DAYS Performed at [facility], [place] [address], [place], [place] [address]    Report Status 12/22/2019 FINAL  Final  Culture, blood (routine x 2)     Status: None   Collection Time: 12/17/19  3:20 PM   Specimen: BLOOD RIGHT HAND  Result Value Ref Range Status   Specimen Description BLOOD RIGHT HAND  Final   Special Requests   Final    BOTTLES DRAWN AEROBIC ONLY Blood Culture results may not be optimal due to an inadequate volume of blood received in culture bottles   Culture   Final    NO GROWTH 5 DAYS Performed at [facility], [place] [address] [place],  [address]    Report Status 12/22/2019 FINAL  Final    Coagulation Studies: No results for input(s): LABPROT, INR in the last 72 hours.  Urinalysis: No results for input(s): COLORURINE, LABSPEC, PHURINE, GLUCOSEU, HGBUR, BILIRUBINUR, KETONESUR, PROTEINUR, UROBILINOGEN, NITRITE, LEUKOCYTESUR in the  last 72 hours.  Invalid input(s): APPERANCEUR    Imaging: No results found.   Medications:       Assessment/ Plan:  54 y.o. male with a PMHx of recent cardiac arrest status post hypothermia protocol, ESRD on HD MWF, anemia of chronic kidney disease, secondary hyperparathyroidism, history of polysubstance abuse, hypertension,, chronic systolic heart failure, medical nonadherence who was admitted to [facility] on 12/24/2019 for ongoing management of acute respiratory failure.  1.  ESRD on HD MWF.  Patient due for dialysis treatment today.  Orders have been prepared.  Continue dialysis on MWF schedule.  2.  Anemia of chronic kidney disease.  Hemoglobin 12.7.  Hold further Retacrit.  3.  Secondary hyperparathyroidism.  Phosphorus 4.1 and acceptable.  4.  Hyperkalemia.  Resolved.  5.  Hypotension.  Maintain the patient on albumin with dialysis treatments.   LOS: 0 [name] [name] 2/5/20218:18 AM

## 2020-01-20 NOTE — Progress Notes (Signed)
[name]   PULMONARY CRITICAL CARE SERVICE  PROGRESS NOTE  Date of Service: 01/20/2020  [name]  QMG:[id number]  DOB: 21-Nov-1966   DOA: 12/24/2019  Referring Physician: Merton Border, MD  HPI: [name] [name] is a 54 y.o. male seen for follow up of Acute on Chronic Respiratory Failure.  Patient right now is on pressure support has been on 28% FiO2 yesterday did about 8 hours off the ventilator on T collar this morning being dialyzed  Medications: Reviewed on Rounds  Physical Exam:  Vitals: Temperature 98.4 pulse 84 respiratory 21 blood pressure is 132/85 saturations 96%  Ventilator Settings on pressure support FiO2 28% tidal volume 484 pressure support 12 PEEP 5  . General: Comfortable at this time . Eyes: Grossly normal lids, irises & conjunctiva . ENT: grossly tongue is normal . Neck: no obvious mass . Cardiovascular: S1 S2 normal no gallop . Respiratory: Coarse rhonchi expansion is equal . Abdomen: soft . Skin: no rash seen on limited exam . Musculoskeletal: not rigid . Psychiatric:unable to assess . Neurologic: no seizure no involuntary movements         Lab Data:   Basic Metabolic Panel: Recent Labs  Lab 01/13/20 0954 01/14/20 1045 01/16/20 0550 01/18/20 0500 01/20/20 0544  NA  --  134* 133* 137 136  K  --  4.0 4.2 4.0 4.5  CL  --  92* 90* 92* 94*  CO2  --  24 22 22 23   GLUCOSE  --  150* 125* 128* 115*  BUN  --  53* 119* 108* 143*  CREATININE  --  4.41* 6.29* 5.75* 6.06*  CALCIUM  --  9.0 9.0 8.9 9.1  MG 1.9 2.0 2.4  --   --   PHOS  --   --  3.8 3.6 4.1    ABG: Recent Labs  Lab 01/19/20 1508  PHART 7.324*  PCO2ART 48.8*  PO2ART 55.6*  HCO3 24.9  O2SAT 85.5    Liver Function Tests: Recent Labs  Lab 01/16/20 0550 01/18/20 0500 01/20/20 0544  ALBUMIN 3.2* 3.5 3.6   No results for input(s): LIPASE, AMYLASE in the last 168 hours. No results for input(s): AMMONIA in the last 168  hours.  CBC: Recent Labs  Lab 01/16/20 0550 01/18/20 0500 01/20/20 0544  WBC 6.4 7.6 8.3  HGB 11.6* 11.5* 12.7*  HCT 37.6* 37.8* 41.5  MCV 88.1 87.9 90.0  PLT 277 312 270    Cardiac Enzymes: No results for input(s): CKTOTAL, CKMB, CKMBINDEX, TROPONINI in the last 168 hours.  BNP (last 3 results) Recent Labs    11/16/19 1356  BNP 962.8*    ProBNP (last 3 results) No results for input(s): PROBNP in the last 8760 hours.  Radiological Exams: No results found.  Assessment/Plan Active Problems:   Acute on chronic respiratory failure with hypoxia (HCC)   End stage renal disease on dialysis (HCC)   Acute on chronic systolic and diastolic heart failure, NYHA class 4 (HCC)   Chronic atrial fibrillation (HCC)   Acute pulmonary embolism without acute cor pulmonale ([place])   1. Acute on chronic respiratory failure hypoxia plan is to continue with the weaning yesterday was able to do 8 hours on T collar today we will advance further 2. End-stage renal disease on hemodialysis 3. Acute on chronic systolic and diastolic heart failure no change 4. Chronic atrial fibrillation rate controlled 5. Pulmonary embolism no change continue with supportive care   I have personally seen and  evaluated the patient, evaluated laboratory and imaging results, formulated the assessment and plan and placed orders. The Patient requires high complexity decision making with multiple systems involvement.  Rounds were done with the Respiratory Therapy Director and Staff therapists and discussed with nursing staff also.  Allyne Gee, MD [facility] Pulmonary Critical Care Medicine Sleep Medicine

## 2020-01-21 DIAGNOSIS — J9690 Respiratory failure, unspecified, unspecified whether with hypoxia or hypercapnia: Secondary | ICD-10-CM | POA: Diagnosis not present

## 2020-01-21 DIAGNOSIS — I2699 Other pulmonary embolism without acute cor pulmonale: Secondary | ICD-10-CM | POA: Diagnosis not present

## 2020-01-21 DIAGNOSIS — I482 Chronic atrial fibrillation, unspecified: Secondary | ICD-10-CM | POA: Diagnosis not present

## 2020-01-21 DIAGNOSIS — I5043 Acute on chronic combined systolic (congestive) and diastolic (congestive) heart failure: Secondary | ICD-10-CM | POA: Diagnosis not present

## 2020-01-21 DIAGNOSIS — I5040 Unspecified combined systolic (congestive) and diastolic (congestive) heart failure: Secondary | ICD-10-CM | POA: Diagnosis not present

## 2020-01-21 DIAGNOSIS — N186 End stage renal disease: Secondary | ICD-10-CM | POA: Diagnosis not present

## 2020-01-21 DIAGNOSIS — J9621 Acute and chronic respiratory failure with hypoxia: Secondary | ICD-10-CM | POA: Diagnosis not present

## 2020-01-21 DIAGNOSIS — Z9911 Dependence on respirator [ventilator] status: Secondary | ICD-10-CM | POA: Diagnosis not present

## 2020-01-21 DIAGNOSIS — Z992 Dependence on renal dialysis: Secondary | ICD-10-CM | POA: Diagnosis not present

## 2020-01-21 NOTE — Progress Notes (Addendum)
Pulmonary Critical Care Medicine [place]   PULMONARY CRITICAL CARE SERVICE  PROGRESS NOTE  Date of Service: 01/21/2020  [name] [name]  HMC:[id number]  DOB: [birth year]   DOA: 12/24/2019  Referring Physician: Merton Border, MD  HPI: [name] [name] is a 54 y.o. male seen for follow up of Acute on Chronic Respiratory Failure.  Patient remains on 35% aerosol trach collar for 16-hour goal today currently satting well no distress.  Medications: Reviewed on Rounds  Physical Exam:  Vitals: Pulse 93 respirations 19 BP 130/82 O2 sat 100% temp 96  Ventilator Settings ATC 35%  . General: Comfortable at this time . Eyes: Grossly normal lids, irises & conjunctiva . ENT: grossly tongue is normal . Neck: no obvious mass . Cardiovascular: S1 S2 normal no gallop . Respiratory: No rales or rhonchi noted . Abdomen: soft . Skin: no rash seen on limited exam . Musculoskeletal: not rigid . Psychiatric:unable to assess . Neurologic: no seizure no involuntary movements         Lab Data:   Basic Metabolic Panel: Recent Labs  Lab 01/16/20 0550 01/18/20 0500 01/20/20 0544  NA 133* 137 136  K 4.2 4.0 4.5  CL 90* 92* 94*  CO2 22 22 23   GLUCOSE 125* 128* 115*  BUN 119* 108* 143*  CREATININE 6.29* 5.75* 6.06*  CALCIUM 9.0 8.9 9.1  MG 2.4  --   --   PHOS 3.8 3.6 4.1    ABG: Recent Labs  Lab 01/19/20 1508  PHART 7.324*  PCO2ART 48.8*  PO2ART 55.6*  HCO3 24.9  O2SAT 85.5    Liver Function Tests: Recent Labs  Lab 01/16/20 0550 01/18/20 0500 01/20/20 0544  ALBUMIN 3.2* 3.5 3.6   No results for input(s): LIPASE, AMYLASE in the last 168 hours. No results for input(s): AMMONIA in the last 168 hours.  CBC: Recent Labs  Lab 01/16/20 0550 01/18/20 0500 01/20/20 0544  WBC 6.4 7.6 8.3  HGB 11.6* 11.5* 12.7*  HCT 37.6* 37.8* 41.5  MCV 88.1 87.9 90.0  PLT 277 312 270    Cardiac Enzymes: No results for input(s): CKTOTAL, CKMB, CKMBINDEX, TROPONINI in the  last 168 hours.  BNP (last 3 results) Recent Labs    11/16/19 1356  BNP 962.8*    ProBNP (last 3 results) No results for input(s): PROBNP in the last 8760 hours.  Radiological Exams: No results found.  Assessment/Plan Active Problems:   Acute on chronic respiratory failure with hypoxia (HCC)   End stage renal disease on dialysis (HCC)   Acute on chronic systolic and diastolic heart failure, NYHA class 4 (HCC)   Chronic atrial fibrillation (HCC)   Acute pulmonary embolism without acute cor pulmonale ([place])   1. Acute on chronic respiratory failure hypoxia plan is to continue with the weaning yesterday was able to do 16 hours on T collar today we will advance further 2. End-stage renal disease on hemodialysis 3. Acute on chronic systolic and diastolic heart failure no change 4. Chronic atrial fibrillation rate controlled 5. Pulmonary embolism no change continue with supportive care   I have personally seen and evaluated the patient, evaluated laboratory and imaging results, formulated the assessment and plan and placed orders. The Patient requires high complexity decision making with multiple systems involvement.  Rounds were done with the Respiratory Therapy Director and Staff therapists and discussed with nursing staff also.  Allyne Gee, MD [facility] Pulmonary Critical Care Medicine Sleep Medicine

## 2020-01-22 DIAGNOSIS — I2699 Other pulmonary embolism without acute cor pulmonale: Secondary | ICD-10-CM | POA: Diagnosis not present

## 2020-01-22 DIAGNOSIS — Z992 Dependence on renal dialysis: Secondary | ICD-10-CM | POA: Diagnosis not present

## 2020-01-22 DIAGNOSIS — N186 End stage renal disease: Secondary | ICD-10-CM | POA: Diagnosis not present

## 2020-01-22 DIAGNOSIS — I5040 Unspecified combined systolic (congestive) and diastolic (congestive) heart failure: Secondary | ICD-10-CM | POA: Diagnosis not present

## 2020-01-22 DIAGNOSIS — Z9911 Dependence on respirator [ventilator] status: Secondary | ICD-10-CM | POA: Diagnosis not present

## 2020-01-22 DIAGNOSIS — I5043 Acute on chronic combined systolic (congestive) and diastolic (congestive) heart failure: Secondary | ICD-10-CM | POA: Diagnosis not present

## 2020-01-22 DIAGNOSIS — I482 Chronic atrial fibrillation, unspecified: Secondary | ICD-10-CM | POA: Diagnosis not present

## 2020-01-22 DIAGNOSIS — J9690 Respiratory failure, unspecified, unspecified whether with hypoxia or hypercapnia: Secondary | ICD-10-CM | POA: Diagnosis not present

## 2020-01-22 DIAGNOSIS — J9621 Acute and chronic respiratory failure with hypoxia: Secondary | ICD-10-CM | POA: Diagnosis not present

## 2020-01-22 NOTE — Progress Notes (Addendum)
Pulmonary Critical Care Medicine [place]   PULMONARY CRITICAL CARE SERVICE  PROGRESS NOTE  Date of Service: 01/22/2020  [name] [name]  CNO:[id number]  DOB: 23-Sep-1966   DOA: 12/24/2019  Referring Physician: Merton Border, MD  HPI: [name] [name] is a 54 y.o. male seen for follow up of Acute on Chronic Respiratory Failure.  Patient continues on aerosol trach collar 35% FiO2 with a 20-hour goal today currently satting well no distress.  Medications: Reviewed on Rounds  Physical Exam:  Vitals: Pulse 86 respirations 22 BP 109/69 O2 sat 100% 6.6  Ventilator Settings 35% ATC  . General: Comfortable at this time . Eyes: Grossly normal lids, irises & conjunctiva . ENT: grossly tongue is normal . Neck: no obvious mass . Cardiovascular: S1 S2 normal no gallop . Respiratory: No rales or rhonchi noted . Abdomen: soft . Skin: no rash seen on limited exam . Musculoskeletal: not rigid . Psychiatric:unable to assess . Neurologic: no seizure no involuntary movements         Lab Data:   Basic Metabolic Panel: Recent Labs  Lab 01/16/20 0550 01/18/20 0500 01/20/20 0544  NA 133* 137 136  K 4.2 4.0 4.5  CL 90* 92* 94*  CO2 22 22 23   GLUCOSE 125* 128* 115*  BUN 119* 108* 143*  CREATININE 6.29* 5.75* 6.06*  CALCIUM 9.0 8.9 9.1  MG 2.4  --   --   PHOS 3.8 3.6 4.1    ABG: Recent Labs  Lab 01/19/20 1508  PHART 7.324*  PCO2ART 48.8*  PO2ART 55.6*  HCO3 24.9  O2SAT 85.5    Liver Function Tests: Recent Labs  Lab 01/16/20 0550 01/18/20 0500 01/20/20 0544  ALBUMIN 3.2* 3.5 3.6   No results for input(s): LIPASE, AMYLASE in the last 168 hours. No results for input(s): AMMONIA in the last 168 hours.  CBC: Recent Labs  Lab 01/16/20 0550 01/18/20 0500 01/20/20 0544  WBC 6.4 7.6 8.3  HGB 11.6* 11.5* 12.7*  HCT 37.6* 37.8* 41.5  MCV 88.1 87.9 90.0  PLT 277 312 270    Cardiac Enzymes: No results for input(s): CKTOTAL, CKMB, CKMBINDEX, TROPONINI  in the last 168 hours.  BNP (last 3 results) Recent Labs    11/16/19 1356  BNP 962.8*    ProBNP (last 3 results) No results for input(s): PROBNP in the last 8760 hours.  Radiological Exams: No results found.  Assessment/Plan Active Problems:   Acute on chronic respiratory failure with hypoxia (HCC)   End stage renal disease on dialysis (HCC)   Acute on chronic systolic and diastolic heart failure, NYHA class 4 (HCC)   Chronic atrial fibrillation (HCC)   Acute pulmonary embolism without acute cor pulmonale ([place])   1. Acute on chronic respiratory failure hypoxia plan is to continue with the weaning yesterday was able to do 20 hours on T collar today we will advance further 2. End-stage renal disease on hemodialysis 3. Acute on chronic systolic and diastolic heart failure no change 4. Chronic atrial fibrillation rate controlled 5. Pulmonary embolism no change continue with supportive care   I have personally seen and evaluated the patient, evaluated laboratory and imaging results, formulated the assessment and plan and placed orders. The Patient requires high complexity decision making with multiple systems involvement.  Rounds were done with the Respiratory Therapy Director and Staff therapists and discussed with nursing staff also.  Allyne Gee, MD [facility] Pulmonary Critical Care Medicine Sleep Medicine

## 2020-01-23 DIAGNOSIS — N186 End stage renal disease: Secondary | ICD-10-CM | POA: Diagnosis not present

## 2020-01-23 DIAGNOSIS — I5043 Acute on chronic combined systolic (congestive) and diastolic (congestive) heart failure: Secondary | ICD-10-CM | POA: Diagnosis not present

## 2020-01-23 DIAGNOSIS — J9690 Respiratory failure, unspecified, unspecified whether with hypoxia or hypercapnia: Secondary | ICD-10-CM | POA: Diagnosis not present

## 2020-01-23 DIAGNOSIS — I5040 Unspecified combined systolic (congestive) and diastolic (congestive) heart failure: Secondary | ICD-10-CM | POA: Diagnosis not present

## 2020-01-23 DIAGNOSIS — D631 Anemia in chronic kidney disease: Secondary | ICD-10-CM | POA: Diagnosis not present

## 2020-01-23 DIAGNOSIS — I959 Hypotension, unspecified: Secondary | ICD-10-CM | POA: Diagnosis not present

## 2020-01-23 DIAGNOSIS — J9621 Acute and chronic respiratory failure with hypoxia: Secondary | ICD-10-CM | POA: Diagnosis not present

## 2020-01-23 DIAGNOSIS — Z992 Dependence on renal dialysis: Secondary | ICD-10-CM | POA: Diagnosis not present

## 2020-01-23 DIAGNOSIS — I482 Chronic atrial fibrillation, unspecified: Secondary | ICD-10-CM | POA: Diagnosis not present

## 2020-01-23 DIAGNOSIS — N2581 Secondary hyperparathyroidism of renal origin: Secondary | ICD-10-CM | POA: Diagnosis not present

## 2020-01-23 DIAGNOSIS — I2699 Other pulmonary embolism without acute cor pulmonale: Secondary | ICD-10-CM | POA: Diagnosis not present

## 2020-01-23 DIAGNOSIS — Z9911 Dependence on respirator [ventilator] status: Secondary | ICD-10-CM | POA: Diagnosis not present

## 2020-01-23 LAB — RENAL FUNCTION PANEL
Albumin: 3.7 g/dL (ref 3.5–5.0)
Anion gap: 23 — ABNORMAL HIGH (ref 5–15)
BUN: 148 mg/dL — ABNORMAL HIGH (ref 6–20)
CO2: 21 mmol/L — ABNORMAL LOW (ref 22–32)
Calcium: 9 mg/dL (ref 8.9–10.3)
Chloride: 88 mmol/L — ABNORMAL LOW (ref 98–111)
Creatinine, Ser: 6.86 mg/dL — ABNORMAL HIGH (ref 0.61–1.24)
GFR calc Af Amer: 10 mL/min — ABNORMAL LOW (ref >60)
GFR calc non Af Amer: 8 mL/min — ABNORMAL LOW (ref >60)
Glucose, Bld: 117 mg/dL — ABNORMAL HIGH (ref 70–99)
Phosphorus: 5.5 mg/dL — ABNORMAL HIGH (ref 2.5–4.6)
Potassium: 4.4 mmol/L (ref 3.5–5.1)
Sodium: 132 mmol/L — ABNORMAL LOW (ref 135–145)

## 2020-01-23 LAB — CBC
HCT: 40.1 % (ref 39.0–52.0)
Hemoglobin: 12.6 g/dL — ABNORMAL LOW (ref 13.0–17.0)
MCH: 27.7 pg (ref 26.0–34.0)
MCHC: 31.4 g/dL (ref 30.0–36.0)
MCV: 88.1 fL (ref 80.0–100.0)
Platelets: 211 10*3/uL (ref 150–400)
RBC: 4.55 MIL/uL (ref 4.22–5.81)
RDW: 19.2 % — ABNORMAL HIGH (ref 11.5–15.5)
WBC: 10.8 10*3/uL — ABNORMAL HIGH (ref 4.0–10.5)
nRBC: 2.6 % — ABNORMAL HIGH (ref 0.0–0.2)

## 2020-01-23 LAB — HEPATITIS B SURFACE ANTIGEN: Hepatitis B Surface Ag: NONREACTIVE

## 2020-01-23 NOTE — Progress Notes (Signed)
[facility]  ROUNDING NOTE   Subjective:  Patient seen and evaluated during dialysis treatment. Tolerating well. Sitting up in chair. Blood flow rate 400.  Objective:  Vital signs in last 24 hours:  Temperature 95 pulse 87 respirations 14 blood pressure 121/93  Physical Exam: General: Chronically ill-appearing  Head: Normocephalic, atraumatic. Moist oral mucosal membranes  Eyes: Anicteric  Neck: Tracheostomy present  Lungs:  Scattered rhonchi  Heart: S1S2 no rubs irregular  Abdomen:  Soft, nontender, bowel sounds present, distended  Extremities: Trace peripheral edema.  Neurologic: Awake, alert, following commands  Skin: Warm/dry, normal skin turgor  Access: Right IJ temporary dialysis catheter    Basic Metabolic Panel: Recent Labs  Lab 01/18/20 0500 01/20/20 0544 01/23/20 0458  NA 137 136 132*  K 4.0 4.5 4.4  CL 92* 94* 88*  CO2 22 23 21*  GLUCOSE 128* 115* 117*  BUN 108* 143* 148*  CREATININE 5.75* 6.06* 6.86*  CALCIUM 8.9 9.1 9.0  PHOS 3.6 4.1 5.5*    Liver Function Tests: Recent Labs  Lab 01/18/20 0500 01/20/20 0544 01/23/20 0458  ALBUMIN 3.5 3.6 3.7   No results for input(s): LIPASE, AMYLASE in the last 168 hours. No results for input(s): AMMONIA in the last 168 hours.  CBC: Recent Labs  Lab 01/18/20 0500 01/20/20 0544 01/23/20 0458  WBC 7.6 8.3 10.8*  HGB 11.5* 12.7* 12.6*  HCT 37.8* 41.5 40.1  MCV 87.9 90.0 88.1  PLT 312 270 211    Cardiac Enzymes: No results for input(s): CKTOTAL, CKMB, CKMBINDEX, TROPONINI in the last 168 hours.  BNP: Invalid input(s): POCBNP  CBG: No results for input(s): GLUCAP in the last 168 hours.  Microbiology: Results for orders placed or performed during the hospital encounter of 11/16/19  SARS Coronavirus 2 by RT PCR (hospital order, performed in [facility] hospital lab) Nasopharyngeal Nasopharyngeal Swab     Status: None   Collection Time: 11/16/19 11:41 AM   Specimen: Nasopharyngeal  Swab  Result Value Ref Range Status   SARS Coronavirus 2 NEGATIVE NEGATIVE Final    Comment: (NOTE) SARS-CoV-2 target nucleic acids are NOT DETECTED. The SARS-CoV-2 RNA is generally detectable in upper and lower respiratory specimens during the acute phase of infection. The lowest concentration of SARS-CoV-2 viral copies this assay can detect is 250 copies / mL. A negative result does not preclude SARS-CoV-2 infection and should not be used as the sole basis for treatment or other patient management decisions.  A negative result may occur with improper specimen collection / handling, submission of specimen other than nasopharyngeal swab, presence of viral mutation(s) within the areas targeted by this assay, and inadequate number of viral copies (<250 copies / mL). A negative result must be combined with clinical observations, patient history, and epidemiological information. Fact Sheet for Patients:   StrictlyIdeas.no Fact Sheet for Healthcare Providers: BankingDealers.co.za This test is not yet approved or cleared  by the [place] FDA and has been authorized for detection and/or diagnosis of SARS-CoV-2 by FDA under an Emergency Use Authorization (EUA).  This EUA will remain in effect (meaning this test can be used) for the duration of the COVID-19 declaration under Section 564(b)(1) of the Act, 21 U.S.C. section 360bbb-3(b)(1), unless the authorization is terminated or revoked sooner. Performed at [facility], [place] [address] [place], [place] [address]   Culture, blood (routine x 2)     Status: None   Collection Time: 11/16/19  6:03 PM   Specimen: BLOOD RIGHT HAND  Result Value Ref Range Status   Specimen Description BLOOD RIGHT HAND  Final   Special Requests   Final    BOTTLES DRAWN AEROBIC AND ANAEROBIC Blood Culture results may not be optimal due to an inadequate volume of blood received in culture bottles   Culture    Final    NO GROWTH 5 DAYS Performed at [place] [address] [place], [place] [address]    Report Status 11/21/2019 FINAL  Final  MRSA PCR Screening     Status: None   Collection Time: 11/16/19  6:05 PM   Specimen: Nasal Mucosa; Nasopharyngeal  Result Value Ref Range Status   MRSA by PCR NEGATIVE NEGATIVE Final    Comment:        The GeneXpert MRSA Assay (FDA approved for NASAL specimens only), is one component of a comprehensive MRSA colonization surveillance program. It is not intended to diagnose MRSA infection nor to guide or monitor treatment for MRSA infections. Performed at [facility], [place] [address] [place], [place] [address]   Culture, blood (routine x 2)     Status: None   Collection Time: 11/16/19  6:08 PM   Specimen: BLOOD RIGHT HAND  Result Value Ref Range Status   Specimen Description BLOOD RIGHT HAND  Final   Special Requests   Final    BOTTLES DRAWN AEROBIC ONLY Blood Culture results may not be optimal due to an inadequate volume of blood received in culture bottles   Culture   Final    NO GROWTH 5 DAYS Performed at [facility], [place] [address] [place], [place] [address]    Report Status 11/21/2019 FINAL  Final  Culture, respiratory (non-expectorated)     Status: None   Collection Time: 11/18/19 12:28 PM   Specimen: Tracheal Aspirate; Respiratory  Result Value Ref Range Status   Specimen Description TRACHEAL ASPIRATE  Final   Special Requests NONE  Final   Gram Stain   Final    ABUNDANT WBC PRESENT,BOTH PMN AND MONONUCLEAR ABUNDANT GRAM POSITIVE COCCI ABUNDANT GRAM NEGATIVE RODS FEW GRAM VARIABLE ROD    Culture   Final    ABUNDANT HAEMOPHILUS INFLUENZAE BETA LACTAMASE POSITIVE Performed at [facility], [place] [address], [place], [place] [address]    Report Status 11/20/2019 FINAL  Final  Surgical pcr screen     Status: Abnormal   Collection Time: 11/24/19  7:53 AM   Specimen: Nasal Mucosa; Nasal Swab  Result  Value Ref Range Status   MRSA, PCR NEGATIVE NEGATIVE Final   Staphylococcus aureus POSITIVE (A) NEGATIVE Final    Comment: (NOTE) The Xpert SA Assay (FDA approved for NASAL specimens in patients 72 years of age and older), is one component of a comprehensive surveillance program. It is not intended to diagnose infection nor to guide or monitor treatment. Performed at [facility], [place] [address], [place], [place] [address]   Culture, blood (routine x 2)     Status: None   Collection Time: 11/25/19  3:30 PM   Specimen: BLOOD  Result Value Ref Range Status   Specimen Description BLOOD RIGHT ANTECUBITAL  Final   Special Requests   Final    BOTTLES DRAWN AEROBIC ONLY Blood Culture results may not be optimal due to an inadequate volume of blood received in culture bottles   Culture   Final    NO GROWTH 5 DAYS Performed at [facility], [place],  [place] [address]    Report Status 11/30/2019 FINAL  Final  Culture, blood (routine x 2)     Status: None   Collection Time: 11/25/19  3:30 PM   Specimen: BLOOD RIGHT HAND  Result Value Ref Range Status   Specimen Description BLOOD RIGHT HAND  Final   Special Requests   Final    BOTTLES DRAWN AEROBIC AND ANAEROBIC Blood Culture results may not be optimal due to an inadequate volume of blood received in culture bottles   Culture   Final    NO GROWTH 5 DAYS Performed at [facility], [place] [address], [place], [place] [address]    Report Status 11/30/2019 FINAL  Final  Culture, respiratory (non-expectorated)     Status: None   Collection Time: 11/25/19  4:35 PM   Specimen: Tracheal Aspirate; Respiratory  Result Value Ref Range Status   Specimen Description TRACHEAL ASPIRATE  Final   Special Requests NONE  Final   Gram Stain   Final    ABUNDANT WBC PRESENT,BOTH PMN AND MONONUCLEAR FEW GRAM POSITIVE COCCI Performed at [facility], [place] [address], [place], [place] [address]    Culture FEW  STAPHYLOCOCCUS EPIDERMIDIS  Final   Report Status 11/28/2019 FINAL  Final   Organism ID, Bacteria STAPHYLOCOCCUS EPIDERMIDIS  Final      Susceptibility   Staphylococcus epidermidis - MIC*    CIPROFLOXACIN <=0.5 SENSITIVE Sensitive     ERYTHROMYCIN >=8 RESISTANT Resistant     GENTAMICIN <=0.5 SENSITIVE Sensitive     OXACILLIN >=4 RESISTANT Resistant     TETRACYCLINE <=1 SENSITIVE Sensitive     VANCOMYCIN 2 SENSITIVE Sensitive     TRIMETH/SULFA <=10 SENSITIVE Sensitive     CLINDAMYCIN >=8 RESISTANT Resistant     RIFAMPIN <=0.5 SENSITIVE Sensitive     Inducible Clindamycin NEGATIVE Sensitive     * FEW STAPHYLOCOCCUS EPIDERMIDIS  Culture, respiratory (non-expectorated)     Status: None   Collection Time: 12/15/19  4:12 PM   Specimen: Tracheal Aspirate; Respiratory  Result Value Ref Range Status   Specimen Description TRACHEAL ASPIRATE  Final   Special Requests NONE  Final   Gram Stain   Final    MODERATE WBC PRESENT, PREDOMINANTLY PMN MODERATE GRAM POSITIVE COCCI IN PAIRS IN CLUSTERS Performed at [facility], [address] [place], [place] [address]    Culture MODERATE STAPHYLOCOCCUS AUREUS  Final   Report Status 12/17/2019 FINAL  Final   Organism ID, Bacteria STAPHYLOCOCCUS AUREUS  Final      Susceptibility   Staphylococcus aureus - MIC*    CIPROFLOXACIN <=0.5 SENSITIVE Sensitive     ERYTHROMYCIN <=0.25 SENSITIVE Sensitive     GENTAMICIN <=0.5 SENSITIVE Sensitive     OXACILLIN <=0.25 SENSITIVE Sensitive     TETRACYCLINE <=1 SENSITIVE Sensitive     VANCOMYCIN 1 SENSITIVE Sensitive     TRIMETH/SULFA <=10 SENSITIVE Sensitive     CLINDAMYCIN <=0.25 SENSITIVE Sensitive     RIFAMPIN <=0.5 SENSITIVE Sensitive     Inducible Clindamycin NEGATIVE Sensitive     * MODERATE STAPHYLOCOCCUS AUREUS  MRSA PCR Screening     Status: None   Collection Time: 12/17/19 10:25 AM   Specimen: Nasopharyngeal  Result Value Ref Range Status   MRSA by PCR NEGATIVE NEGATIVE Final    Comment:         The GeneXpert MRSA Assay (FDA approved for NASAL specimens only), is one component of a comprehensive MRSA colonization surveillance program. It is not  intended to diagnose MRSA infection nor to guide or monitor treatment for MRSA infections. Performed at [facility], [place] [address] [place], [place] [address]   Culture, blood (routine x 2)     Status: None   Collection Time: 12/17/19  3:15 PM   Specimen: BLOOD  Result Value Ref Range Status   Specimen Description BLOOD RIGHT ANTECUBITAL  Final   Special Requests   Final    BOTTLES DRAWN AEROBIC AND ANAEROBIC Blood Culture results may not be optimal due to an inadequate volume of blood received in culture bottles   Culture   Final    NO GROWTH 5 DAYS Performed at [facility], [place] [address] [place], [place] [address]    Report Status 12/22/2019 FINAL  Final  Culture, blood (routine x 2)     Status: None   Collection Time: 12/17/19  3:20 PM   Specimen: BLOOD RIGHT HAND  Result Value Ref Range Status   Specimen Description BLOOD RIGHT HAND  Final   Special Requests   Final    BOTTLES DRAWN AEROBIC ONLY Blood Culture results may not be optimal due to an inadequate volume of blood received in culture bottles   Culture   Final    NO GROWTH 5 DAYS Performed at [facility], [place] [address], [place], [place] [address]    Report Status 12/22/2019 FINAL  Final    Coagulation Studies: No results for input(s): LABPROT, INR in the last 72 hours.  Urinalysis: No results for input(s): COLORURINE, LABSPEC, PHURINE, GLUCOSEU, HGBUR, BILIRUBINUR, KETONESUR, PROTEINUR, UROBILINOGEN, NITRITE, LEUKOCYTESUR in the last 72 hours.  Invalid input(s): APPERANCEUR    Imaging: No results found.   Medications:       Assessment/ Plan:  54 y.o. male with a PMHx of recent cardiac arrest status post hypothermia protocol, ESRD on HD MWF, anemia of chronic kidney disease, secondary hyperparathyroidism, history of  polysubstance abuse, hypertension,, chronic systolic heart failure, medical nonadherence who was admitted to [facility] on 12/24/2019 for ongoing management of acute respiratory failure.  1.  ESRD on HD MWF.  Patient seen during dialysis treatment.  Tolerating well.  Blood flow rate 400.  2.  Anemia of chronic kidney disease.  Hemoglobin currently 12.6.  Retacrit held.  3.  Secondary hyperparathyroidism.  Phosphorus this a.m. 5.5 and acceptable.  4.  Hyperkalemia.  Resolved.  5.  Hypotension.  Continue albumin with dialysis.   LOS: 0 Nuha Degner 2/8/20218:42 AM

## 2020-01-23 NOTE — Progress Notes (Signed)
Pulmonary [place]   PULMONARY CRITICAL CARE SERVICE  PROGRESS NOTE  Date of Service: 01/23/2020  Khalee Mazo  EGB:[id number]  DOB: 1966-03-13   DOA: 12/24/2019  Referring Physician: Merton Border, MD  HPI: Zyden Suman is a 54 y.o. male seen for follow up of Acute on Chronic Respiratory Failure.  Patient is on T collar currently on 35% FiO2 good saturations are noted at this time  Medications: Reviewed on Rounds  Physical Exam:  Vitals: Temperature is 95.0 pulse 87 respiratory rate 14 blood pressure is 121/93 saturations 96%  Ventilator Settings on T collar FiO2 35%  . General: Comfortable at this time . Eyes: Grossly normal lids, irises & conjunctiva . ENT: grossly tongue is normal . Neck: no obvious mass . Cardiovascular: S1 S2 normal no gallop . Respiratory: No rhonchi coarse breath sounds . Abdomen: soft . Skin: no rash seen on limited exam . Musculoskeletal: not rigid . Psychiatric:unable to assess . Neurologic: no seizure no involuntary movements         Lab Data:   Basic Metabolic Panel: Recent Labs  Lab 01/18/20 0500 01/20/20 0544 01/23/20 0458  NA 137 136 132*  K 4.0 4.5 4.4  CL 92* 94* 88*  CO2 22 23 21*  GLUCOSE 128* 115* 117*  BUN 108* 143* 148*  CREATININE 5.75* 6.06* 6.86*  CALCIUM 8.9 9.1 9.0  PHOS 3.6 4.1 5.5*    ABG: Recent Labs  Lab 01/19/20 1508  PHART 7.324*  PCO2ART 48.8*  PO2ART 55.6*  HCO3 24.9  O2SAT 85.5    Liver Function Tests: Recent Labs  Lab 01/18/20 0500 01/20/20 0544 01/23/20 0458  ALBUMIN 3.5 3.6 3.7   No results for input(s): LIPASE, AMYLASE in the last 168 hours. No results for input(s): AMMONIA in the last 168 hours.  CBC: Recent Labs  Lab 01/18/20 0500 01/20/20 0544 01/23/20 0458  WBC 7.6 8.3 10.8*  HGB 11.5* 12.7* 12.6*  HCT 37.8* 41.5 40.1  MCV 87.9 90.0 88.1  PLT 312 270 211    Cardiac Enzymes: No results for input(s): CKTOTAL, CKMB,  CKMBINDEX, TROPONINI in the last 168 hours.  BNP (last 3 results) Recent Labs    11/16/19 1356  BNP 962.8*    ProBNP (last 3 results) No results for input(s): PROBNP in the last 8760 hours.  Radiological Exams: No results found.  Assessment/Plan Active Problems:   Acute on chronic respiratory failure with hypoxia (HCC)   End stage renal disease on dialysis (HCC)   Acute on chronic systolic and diastolic heart failure, NYHA class 4 (HCC)   Chronic atrial fibrillation (HCC)   Acute pulmonary embolism without acute cor pulmonale ([place])   1. Acute on chronic respiratory failure with hypoxia plan is to continue to wean on T collar trials goal today is for 20 hours we will try to advance as tolerated 2. End-stage renal disease on hemodialysis we will continue with supportive care 3. Acute on chronic systolic heart failure monitor fluid status right now is compensated 4. Chronic atrial fibrillation rate controlled 5. Acute pulmonary embolism no change we will support   I have personally seen and evaluated the patient, evaluated laboratory and imaging results, formulated the assessment and plan and placed orders. The Patient requires high complexity decision making with multiple systems involvement.  Rounds were done with the Respiratory Therapy Director and Staff therapists and discussed with nursing staff also.  Allyne Gee, MD [facility] Pulmonary Critical Care Medicine Sleep Medicine

## 2020-01-24 DIAGNOSIS — I2699 Other pulmonary embolism without acute cor pulmonale: Secondary | ICD-10-CM | POA: Diagnosis not present

## 2020-01-24 DIAGNOSIS — I482 Chronic atrial fibrillation, unspecified: Secondary | ICD-10-CM | POA: Diagnosis not present

## 2020-01-24 DIAGNOSIS — I5040 Unspecified combined systolic (congestive) and diastolic (congestive) heart failure: Secondary | ICD-10-CM | POA: Diagnosis not present

## 2020-01-24 DIAGNOSIS — N186 End stage renal disease: Secondary | ICD-10-CM | POA: Diagnosis not present

## 2020-01-24 DIAGNOSIS — I5043 Acute on chronic combined systolic (congestive) and diastolic (congestive) heart failure: Secondary | ICD-10-CM | POA: Diagnosis not present

## 2020-01-24 DIAGNOSIS — J9621 Acute and chronic respiratory failure with hypoxia: Secondary | ICD-10-CM | POA: Diagnosis not present

## 2020-01-24 DIAGNOSIS — Z992 Dependence on renal dialysis: Secondary | ICD-10-CM | POA: Diagnosis not present

## 2020-01-24 DIAGNOSIS — J9690 Respiratory failure, unspecified, unspecified whether with hypoxia or hypercapnia: Secondary | ICD-10-CM | POA: Diagnosis not present

## 2020-01-24 DIAGNOSIS — Z9911 Dependence on respirator [ventilator] status: Secondary | ICD-10-CM | POA: Diagnosis not present

## 2020-01-24 NOTE — Progress Notes (Signed)
Pulmonary [place]   PULMONARY CRITICAL CARE SERVICE  PROGRESS NOTE  Date of Service: 01/24/2020  [name] [name]  ZWC:[id number]  DOB: Dec 19, 1965   DOA: 12/24/2019  Referring Physician: Merton Border, MD  HPI: [name] [name] is a 54 y.o. male seen for follow up of Acute on Chronic Respiratory Failure.  Patient is on T collar currently on 28% FiO2 good saturations are noted at this time  Medications: Reviewed on Rounds  Physical Exam:  Vitals: Temperature 97.3 pulse 86 respiratory 26 blood pressure is 118/88 saturations 100%  Ventilator Settings on T collar with an FiO2 28%  . General: Comfortable at this time . Eyes: Grossly normal lids, irises & conjunctiva . ENT: grossly tongue is normal . Neck: no obvious mass . Cardiovascular: S1 S2 normal no gallop . Respiratory: Scattered rhonchi expansion is equal . Abdomen: soft . Skin: no rash seen on limited exam . Musculoskeletal: not rigid . Psychiatric:unable to assess . Neurologic: no seizure no involuntary movements         Lab Data:   Basic Metabolic Panel: Recent Labs  Lab 01/18/20 0500 01/20/20 0544 01/23/20 0458  NA 137 136 132*  K 4.0 4.5 4.4  CL 92* 94* 88*  CO2 22 23 21*  GLUCOSE 128* 115* 117*  BUN 108* 143* 148*  CREATININE 5.75* 6.06* 6.86*  CALCIUM 8.9 9.1 9.0  PHOS 3.6 4.1 5.5*    ABG: Recent Labs  Lab 01/19/20 1508  PHART 7.324*  PCO2ART 48.8*  PO2ART 55.6*  HCO3 24.9  O2SAT 85.5    Liver Function Tests: Recent Labs  Lab 01/18/20 0500 01/20/20 0544 01/23/20 0458  ALBUMIN 3.5 3.6 3.7   No results for input(s): LIPASE, AMYLASE in the last 168 hours. No results for input(s): AMMONIA in the last 168 hours.  CBC: Recent Labs  Lab 01/18/20 0500 01/20/20 0544 01/23/20 0458  WBC 7.6 8.3 10.8*  HGB 11.5* 12.7* 12.6*  HCT 37.8* 41.5 40.1  MCV 87.9 90.0 88.1  PLT 312 270 211    Cardiac Enzymes: No results for input(s): CKTOTAL, CKMB,  CKMBINDEX, TROPONINI in the last 168 hours.  BNP (last 3 results) Recent Labs    11/16/19 1356  BNP 962.8*    ProBNP (last 3 results) No results for input(s): PROBNP in the last 8760 hours.  Radiological Exams: No results found.  Assessment/Plan Active Problems:   Acute on chronic respiratory failure with hypoxia (HCC)   End stage renal disease on dialysis (HCC)   Acute on chronic systolic and diastolic heart failure, NYHA class 4 (HCC)   Chronic atrial fibrillation (HCC)   Acute pulmonary embolism without acute cor pulmonale ([place])   1. Acute on chronic respiratory failure hypoxia plan is to continue with T collar trials titrate oxygen continue pulmonary toilet 2. End-stage renal disease on hemodialysis per nephrology recommendations 3. Acute systolic heart failure monitor fluid status closely 4. Acute pulmonary embolism treated 5. Chronic atrial fibrillation rate controlled   I have personally seen and evaluated the patient, evaluated laboratory and imaging results, formulated the assessment and plan and placed orders. The Patient requires high complexity decision making with multiple systems involvement.  Rounds were done with the Respiratory Therapy Director and Staff therapists and discussed with nursing staff also.  Allyne Gee, MD [facility] Pulmonary Critical Care Medicine Sleep Medicine

## 2020-01-25 DIAGNOSIS — J9621 Acute and chronic respiratory failure with hypoxia: Secondary | ICD-10-CM | POA: Diagnosis not present

## 2020-01-25 DIAGNOSIS — I2699 Other pulmonary embolism without acute cor pulmonale: Secondary | ICD-10-CM | POA: Diagnosis not present

## 2020-01-25 DIAGNOSIS — Z992 Dependence on renal dialysis: Secondary | ICD-10-CM | POA: Diagnosis not present

## 2020-01-25 DIAGNOSIS — I482 Chronic atrial fibrillation, unspecified: Secondary | ICD-10-CM | POA: Diagnosis not present

## 2020-01-25 DIAGNOSIS — N186 End stage renal disease: Secondary | ICD-10-CM | POA: Diagnosis not present

## 2020-01-25 DIAGNOSIS — I5040 Unspecified combined systolic (congestive) and diastolic (congestive) heart failure: Secondary | ICD-10-CM | POA: Diagnosis not present

## 2020-01-25 DIAGNOSIS — Z9911 Dependence on respirator [ventilator] status: Secondary | ICD-10-CM | POA: Diagnosis not present

## 2020-01-25 DIAGNOSIS — J9690 Respiratory failure, unspecified, unspecified whether with hypoxia or hypercapnia: Secondary | ICD-10-CM | POA: Diagnosis not present

## 2020-01-25 DIAGNOSIS — I5043 Acute on chronic combined systolic (congestive) and diastolic (congestive) heart failure: Secondary | ICD-10-CM | POA: Diagnosis not present

## 2020-01-25 LAB — CBC
HCT: 42 % (ref 39.0–52.0)
Hemoglobin: 12.6 g/dL — ABNORMAL LOW (ref 13.0–17.0)
MCH: 27.3 pg (ref 26.0–34.0)
MCHC: 30 g/dL (ref 30.0–36.0)
MCV: 90.9 fL (ref 80.0–100.0)
Platelets: 214 10*3/uL (ref 150–400)
RBC: 4.62 MIL/uL (ref 4.22–5.81)
RDW: 19.9 % — ABNORMAL HIGH (ref 11.5–15.5)
WBC: 15.1 10*3/uL — ABNORMAL HIGH (ref 4.0–10.5)
nRBC: 1.9 % — ABNORMAL HIGH (ref 0.0–0.2)

## 2020-01-25 LAB — RENAL FUNCTION PANEL
Albumin: 3.7 g/dL (ref 3.5–5.0)
Anion gap: 20 — ABNORMAL HIGH (ref 5–15)
BUN: 109 mg/dL — ABNORMAL HIGH (ref 6–20)
CO2: 23 mmol/L (ref 22–32)
Calcium: 9 mg/dL (ref 8.9–10.3)
Chloride: 93 mmol/L — ABNORMAL LOW (ref 98–111)
Creatinine, Ser: 6.48 mg/dL — ABNORMAL HIGH (ref 0.61–1.24)
GFR calc Af Amer: 11 mL/min — ABNORMAL LOW (ref >60)
GFR calc non Af Amer: 9 mL/min — ABNORMAL LOW (ref >60)
Glucose, Bld: 113 mg/dL — ABNORMAL HIGH (ref 70–99)
Phosphorus: 5.5 mg/dL — ABNORMAL HIGH (ref 2.5–4.6)
Potassium: 4.2 mmol/L (ref 3.5–5.1)
Sodium: 136 mmol/L (ref 135–145)

## 2020-01-25 NOTE — Progress Notes (Signed)
[facility]  ROUNDING NOTE   Subjective:  Patient seen and evaluated at bedside. Blood pressure was low during dialysis treatment today. Did receive albumin.  Objective:  Vital signs in last 24 hours:  Temperature 97.1 pulse 86 respirations 20 blood pressure 90/77  Physical Exam: General: Chronically ill-appearing  Head: Normocephalic, atraumatic. Moist oral mucosal membranes  Eyes: Anicteric  Neck: Tracheostomy present  Lungs:  Scattered rhonchi, nomral effort  Heart: S1S2 no rubs irregular  Abdomen:  Soft, nontender, bowel sounds present, distended  Extremities: 1+ peripheral edema.  Neurologic: Awake, alert, following commands  Skin: Warm/dry, normal skin turgor  Access: LUE AV access    Basic Metabolic Panel: Recent Labs  Lab 01/20/20 0544 01/23/20 0458 01/25/20 0500  NA 136 132* 136  K 4.5 4.4 4.2  CL 94* 88* 93*  CO2 23 21* 23  GLUCOSE 115* 117* 113*  BUN 143* 148* PENDING  CREATININE 6.06* 6.86* 6.48*  CALCIUM 9.1 9.0 9.0  PHOS 4.1 5.5* 5.5*    Liver Function Tests: Recent Labs  Lab 01/20/20 0544 01/23/20 0458 01/25/20 0500  ALBUMIN 3.6 3.7 3.7   No results for input(s): LIPASE, AMYLASE in the last 168 hours. No results for input(s): AMMONIA in the last 168 hours.  CBC: Recent Labs  Lab 01/20/20 0544 01/23/20 0458 01/25/20 0500  WBC 8.3 10.8* 15.1*  HGB 12.7* 12.6* 12.6*  HCT 41.5 40.1 42.0  MCV 90.0 88.1 90.9  PLT 270 211 214    Cardiac Enzymes: No results for input(s): CKTOTAL, CKMB, CKMBINDEX, TROPONINI in the last 168 hours.  BNP: Invalid input(s): POCBNP  CBG: No results for input(s): GLUCAP in the last 168 hours.  Microbiology: Results for orders placed or performed during the hospital encounter of 11/16/19  SARS Coronavirus 2 by RT PCR (hospital order, performed in [facility] hospital lab) Nasopharyngeal Nasopharyngeal Swab     Status: None   Collection Time: 11/16/19 11:41 AM   Specimen: Nasopharyngeal Swab   Result Value Ref Range Status   SARS Coronavirus 2 NEGATIVE NEGATIVE Final    Comment: (NOTE) SARS-CoV-2 target nucleic acids are NOT DETECTED. The SARS-CoV-2 RNA is generally detectable in upper and lower respiratory specimens during the acute phase of infection. The lowest concentration of SARS-CoV-2 viral copies this assay can detect is 250 copies / mL. A negative result does not preclude SARS-CoV-2 infection and should not be used as the sole basis for treatment or other patient management decisions.  A negative result may occur with improper specimen collection / handling, submission of specimen other than nasopharyngeal swab, presence of viral mutation(s) within the areas targeted by this assay, and inadequate number of viral copies (<250 copies / mL). A negative result must be combined with clinical observations, patient history, and epidemiological information. Fact Sheet for Patients:   StrictlyIdeas.no Fact Sheet for Healthcare Providers: BankingDealers.co.za This test is not yet approved or cleared  by the [place] FDA and has been authorized for detection and/or diagnosis of SARS-CoV-2 by FDA under an Emergency Use Authorization (EUA).  This EUA will remain in effect (meaning this test can be used) for the duration of the COVID-19 declaration under Section 564(b)(1) of the Act, 21 U.S.C. section 360bbb-3(b)(1), unless the authorization is terminated or revoked sooner. Performed at [facility], [place] [address] [place],  [address]   Culture, blood (routine x 2)     Status: None   Collection Time: 11/16/19  6:03 PM   Specimen: BLOOD RIGHT HAND  Result Value Ref Range Status   Specimen Description BLOOD RIGHT HAND  Final   Special Requests   Final    BOTTLES DRAWN AEROBIC AND ANAEROBIC Blood Culture results may not be optimal due to an inadequate volume of blood received in culture bottles   Culture   Final     NO GROWTH 5 DAYS Performed at [place] [address] [place], [place] [address]    Report Status 11/21/2019 FINAL  Final  MRSA PCR Screening     Status: None   Collection Time: 11/16/19  6:05 PM   Specimen: Nasal Mucosa; Nasopharyngeal  Result Value Ref Range Status   MRSA by PCR NEGATIVE NEGATIVE Final    Comment:        The GeneXpert MRSA Assay (FDA approved for NASAL specimens only), is one component of a comprehensive MRSA colonization surveillance program. It is not intended to diagnose MRSA infection nor to guide or monitor treatment for MRSA infections. Performed at [facility], [place] [address] [place], [place] [address]   Culture, blood (routine x 2)     Status: None   Collection Time: 11/16/19  6:08 PM   Specimen: BLOOD RIGHT HAND  Result Value Ref Range Status   Specimen Description BLOOD RIGHT HAND  Final   Special Requests   Final    BOTTLES DRAWN AEROBIC ONLY Blood Culture results may not be optimal due to an inadequate volume of blood received in culture bottles   Culture   Final    NO GROWTH 5 DAYS Performed at [facility], [place] [address], [place], [place] [address]    Report Status 11/21/2019 FINAL  Final  Culture, respiratory (non-expectorated)     Status: None   Collection Time: 11/18/19 12:28 PM   Specimen: Tracheal Aspirate; Respiratory  Result Value Ref Range Status   Specimen Description TRACHEAL ASPIRATE  Final   Special Requests NONE  Final   Gram Stain   Final    ABUNDANT WBC PRESENT,BOTH PMN AND MONONUCLEAR ABUNDANT GRAM POSITIVE COCCI ABUNDANT GRAM NEGATIVE RODS FEW GRAM VARIABLE ROD    Culture   Final    ABUNDANT HAEMOPHILUS INFLUENZAE BETA LACTAMASE POSITIVE Performed at [facility], [place] [address] [place], [place] [address]    Report Status 11/20/2019 FINAL  Final  Surgical pcr screen     Status: Abnormal   Collection Time: 11/24/19  7:53 AM   Specimen: Nasal Mucosa; Nasal Swab  Result Value Ref  Range Status   MRSA, PCR NEGATIVE NEGATIVE Final   Staphylococcus aureus POSITIVE (A) NEGATIVE Final    Comment: (NOTE) The Xpert SA Assay (FDA approved for NASAL specimens in patients 66 years of age and older), is one component of a comprehensive surveillance program. It is not intended to diagnose infection nor to guide or monitor treatment. Performed at [facility], [place] [address], [place], [place] [address]   Culture, blood (routine x 2)     Status: None   Collection Time: 11/25/19  3:30 PM   Specimen: BLOOD  Result Value Ref Range Status   Specimen Description BLOOD RIGHT ANTECUBITAL  Final   Special Requests   Final    BOTTLES DRAWN AEROBIC ONLY Blood Culture results may not be optimal due to an inadequate volume of blood received in culture bottles   Culture   Final    NO GROWTH 5 DAYS Performed at [facility], [place],  [place] [address]    Report Status 11/30/2019 FINAL  Final  Culture, blood (routine x 2)     Status: None   Collection Time: 11/25/19  3:30 PM   Specimen: BLOOD RIGHT HAND  Result Value Ref Range Status   Specimen Description BLOOD RIGHT HAND  Final   Special Requests   Final    BOTTLES DRAWN AEROBIC AND ANAEROBIC Blood Culture results may not be optimal due to an inadequate volume of blood received in culture bottles   Culture   Final    NO GROWTH 5 DAYS Performed at [facility], [place] [address] [place], [place] [address]    Report Status 11/30/2019 FINAL  Final  Culture, respiratory (non-expectorated)     Status: None   Collection Time: 11/25/19  4:35 PM   Specimen: Tracheal Aspirate; Respiratory  Result Value Ref Range Status   Specimen Description TRACHEAL ASPIRATE  Final   Special Requests NONE  Final   Gram Stain   Final    ABUNDANT WBC PRESENT,BOTH PMN AND MONONUCLEAR FEW GRAM POSITIVE COCCI Performed at [facility], [place] [address] [place], [place] [address]    Culture FEW STAPHYLOCOCCUS  EPIDERMIDIS  Final   Report Status 11/28/2019 FINAL  Final   Organism ID, Bacteria STAPHYLOCOCCUS EPIDERMIDIS  Final      Susceptibility   Staphylococcus epidermidis - MIC*    CIPROFLOXACIN <=0.5 SENSITIVE Sensitive     ERYTHROMYCIN >=8 RESISTANT Resistant     GENTAMICIN <=0.5 SENSITIVE Sensitive     OXACILLIN >=4 RESISTANT Resistant     TETRACYCLINE <=1 SENSITIVE Sensitive     VANCOMYCIN 2 SENSITIVE Sensitive     TRIMETH/SULFA <=10 SENSITIVE Sensitive     CLINDAMYCIN >=8 RESISTANT Resistant     RIFAMPIN <=0.5 SENSITIVE Sensitive     Inducible Clindamycin NEGATIVE Sensitive     * FEW STAPHYLOCOCCUS EPIDERMIDIS  Culture, respiratory (non-expectorated)     Status: None   Collection Time: 12/15/19  4:12 PM   Specimen: Tracheal Aspirate; Respiratory  Result Value Ref Range Status   Specimen Description TRACHEAL ASPIRATE  Final   Special Requests NONE  Final   Gram Stain   Final    MODERATE WBC PRESENT, PREDOMINANTLY PMN MODERATE GRAM POSITIVE COCCI IN PAIRS IN CLUSTERS Performed at [facility], [address], [place], [place] [address]    Culture MODERATE STAPHYLOCOCCUS AUREUS  Final   Report Status 12/17/2019 FINAL  Final   Organism ID, Bacteria STAPHYLOCOCCUS AUREUS  Final      Susceptibility   Staphylococcus aureus - MIC*    CIPROFLOXACIN <=0.5 SENSITIVE Sensitive     ERYTHROMYCIN <=0.25 SENSITIVE Sensitive     GENTAMICIN <=0.5 SENSITIVE Sensitive     OXACILLIN <=0.25 SENSITIVE Sensitive     TETRACYCLINE <=1 SENSITIVE Sensitive     VANCOMYCIN 1 SENSITIVE Sensitive     TRIMETH/SULFA <=10 SENSITIVE Sensitive     CLINDAMYCIN <=0.25 SENSITIVE Sensitive     RIFAMPIN <=0.5 SENSITIVE Sensitive     Inducible Clindamycin NEGATIVE Sensitive     * MODERATE STAPHYLOCOCCUS AUREUS  MRSA PCR Screening     Status: None   Collection Time: 12/17/19 10:25 AM   Specimen: Nasopharyngeal  Result Value Ref Range Status   MRSA by PCR NEGATIVE NEGATIVE Final    Comment:        The  GeneXpert MRSA Assay (FDA approved for NASAL specimens only), is one component of a comprehensive MRSA colonization surveillance program. It is not  intended to diagnose MRSA infection nor to guide or monitor treatment for MRSA infections. Performed at [facility], [place] [address], [place], [place] [address]   Culture, blood (routine x 2)     Status: None   Collection Time: 12/17/19  3:15 PM   Specimen: BLOOD  Result Value Ref Range Status   Specimen Description BLOOD RIGHT ANTECUBITAL  Final   Special Requests   Final    BOTTLES DRAWN AEROBIC AND ANAEROBIC Blood Culture results may not be optimal due to an inadequate volume of blood received in culture bottles   Culture   Final    NO GROWTH 5 DAYS Performed at [facility], [place] [address], [place], [place] [address]    Report Status 12/22/2019 FINAL  Final  Culture, blood (routine x 2)     Status: None   Collection Time: 12/17/19  3:20 PM   Specimen: BLOOD RIGHT HAND  Result Value Ref Range Status   Specimen Description BLOOD RIGHT HAND  Final   Special Requests   Final    BOTTLES DRAWN AEROBIC ONLY Blood Culture results may not be optimal due to an inadequate volume of blood received in culture bottles   Culture   Final    NO GROWTH 5 DAYS Performed at [facility], [place] [address], [place], [place] [address]    Report Status 12/22/2019 FINAL  Final    Coagulation Studies: No results for input(s): LABPROT, INR in the last 72 hours.  Urinalysis: No results for input(s): COLORURINE, LABSPEC, PHURINE, GLUCOSEU, HGBUR, BILIRUBINUR, KETONESUR, PROTEINUR, UROBILINOGEN, NITRITE, LEUKOCYTESUR in the last 72 hours.  Invalid input(s): APPERANCEUR    Imaging: No results found.   Medications:       Assessment/ Plan:  54 y.o. male with a PMHx of recent cardiac arrest status post hypothermia protocol, ESRD on HD MWF, anemia of chronic kidney disease, secondary hyperparathyroidism, history of polysubstance  abuse, hypertension,, chronic systolic heart failure, medical nonadherence who was admitted to [facility] on 12/24/2019 for ongoing management of acute respiratory failure.  1.  ESRD on HD MWF.  Patient seen after dialysis treatment today.  Hypotension was noted during the dialysis treatment.  Albumin use x2.  We will plan for hemodialysis again on Friday.  2.  Anemia of chronic kidney disease.  Hemoglobin 12.6.  Patient continues to be off of Retacrit.  3.  Secondary hyperparathyroidism.  Phosphorus 5.5 and at target.  4.  Hyperkalemia.  Resolved.  5.  Hypotension.  Patient had persistent hypotension during dialysis treatment today.  Previously required norepinephrine during dialysis treatments.  Continue to monitor blood pressure trend.   LOS: 0 [name] [name] 2/10/202111:39 AM

## 2020-01-25 NOTE — Progress Notes (Addendum)
[facility]   PULMONARY CRITICAL CARE SERVICE  PROGRESS NOTE  Date of Service: 01/25/2020  [name] [name]  HGD:[id number]   DOB: [birth year]   DOA: 12/24/2019  Referring Physician: Merton Border, MD  HPI: [name] [name] is a 54 y.o. male seen for follow up of Acute on Chronic Respiratory Failure.  He remains on aerosol trach collar for 72-hour goal at this time 28%.  Medications: Reviewed on Rounds  Physical Exam:  Vitals: Per pulse 86 respiration 20 BP 90/77 O2 sat 98% temp 97.1  Ventilator Settings ATC 28%  . General: Comfortable at this time . Eyes: Grossly normal lids, irises & conjunctiva . ENT: grossly tongue is normal . Neck: no obvious mass . Cardiovascular: S1 S2 normal no gallop . Respiratory: Coarse breath sounds. . Abdomen: soft . Skin: no rash seen on limited exam . Musculoskeletal: not rigid . Psychiatric:unable to assess . Neurologic: no seizure no involuntary movements         Lab Data:   Basic Metabolic Panel: Recent Labs  Lab 01/20/20 0544 01/23/20 0458 01/25/20 0500  NA 136 132* 136  K 4.5 4.4 4.2  CL 94* 88* 93*  CO2 23 21* 23  GLUCOSE 115* 117* 113*  BUN 143* 148* 109*  CREATININE 6.06* 6.86* 6.48*  CALCIUM 9.1 9.0 9.0  PHOS 4.1 5.5* 5.5*    ABG: Recent Labs  Lab 01/19/20 1508  PHART 7.324*  PCO2ART 48.8*  PO2ART 55.6*  HCO3 24.9  O2SAT 85.5    Liver Function Tests: Recent Labs  Lab 01/20/20 0544 01/23/20 0458 01/25/20 0500  ALBUMIN 3.6 3.7 3.7   No results for input(s): LIPASE, AMYLASE in the last 168 hours. No results for input(s): AMMONIA in the last 168 hours.  CBC: Recent Labs  Lab 01/20/20 0544 01/23/20 0458 01/25/20 0500  WBC 8.3 10.8* 15.1*  HGB 12.7* 12.6* 12.6*  HCT 41.5 40.1 42.0  MCV 90.0 88.1 90.9  PLT 270 211 214    Cardiac Enzymes: No results for input(s): CKTOTAL, CKMB, CKMBINDEX, TROPONINI in the last 168 hours.  BNP (last 3 results) Recent  Labs    11/16/19 1356  BNP 962.8*    ProBNP (last 3 results) No results for input(s): PROBNP in the last 8760 hours.  Radiological Exams: No results found.  Assessment/Plan Active Problems:   Acute on chronic respiratory failure with hypoxia (HCC)   End stage renal disease on dialysis (HCC)   Acute on chronic systolic and diastolic heart failure, NYHA class 4 (HCC)   Chronic atrial fibrillation (HCC)   Acute pulmonary embolism without acute cor pulmonale ([place])   1. Acute on chronic respiratory failure hypoxia plan is to continue with T collar trials titrate oxygen continue pulmonary toilet 2. End-stage renal disease on hemodialysis per nephrology recommendations 3. Acute systolic heart failure monitor fluid status closely 4. Acute pulmonary embolism treated 5. Chronic atrial fibrillation rate controlled   I have personally seen and evaluated the patient, evaluated laboratory and imaging results, formulated the assessment and plan and placed orders. The Patient requires high complexity decision making with multiple systems involvement.  Rounds were done with the Respiratory Therapy Director and Staff therapists and discussed with nursing staff also.  Allyne Gee, MD [facility] Pulmonary Critical Care Medicine Sleep Medicine

## 2020-01-26 DIAGNOSIS — I5040 Unspecified combined systolic (congestive) and diastolic (congestive) heart failure: Secondary | ICD-10-CM | POA: Diagnosis not present

## 2020-01-26 DIAGNOSIS — I5043 Acute on chronic combined systolic (congestive) and diastolic (congestive) heart failure: Secondary | ICD-10-CM | POA: Diagnosis not present

## 2020-01-26 DIAGNOSIS — Z9911 Dependence on respirator [ventilator] status: Secondary | ICD-10-CM | POA: Diagnosis not present

## 2020-01-26 DIAGNOSIS — J9690 Respiratory failure, unspecified, unspecified whether with hypoxia or hypercapnia: Secondary | ICD-10-CM | POA: Diagnosis not present

## 2020-01-26 DIAGNOSIS — Z992 Dependence on renal dialysis: Secondary | ICD-10-CM | POA: Diagnosis not present

## 2020-01-26 DIAGNOSIS — I482 Chronic atrial fibrillation, unspecified: Secondary | ICD-10-CM | POA: Diagnosis not present

## 2020-01-26 DIAGNOSIS — N186 End stage renal disease: Secondary | ICD-10-CM | POA: Diagnosis not present

## 2020-01-26 DIAGNOSIS — I2699 Other pulmonary embolism without acute cor pulmonale: Secondary | ICD-10-CM | POA: Diagnosis not present

## 2020-01-26 DIAGNOSIS — J9621 Acute and chronic respiratory failure with hypoxia: Secondary | ICD-10-CM | POA: Diagnosis not present

## 2020-01-26 LAB — BLOOD GAS, ARTERIAL
Acid-Base Excess: 2.6 mmol/L — ABNORMAL HIGH (ref 0.0–2.0)
Bicarbonate: 27.4 mmol/L (ref 20.0–28.0)
FIO2: 28
O2 Saturation: 94.5 %
Patient temperature: 36.7
pCO2 arterial: 48 mmHg (ref 32.0–48.0)
pH, Arterial: 7.373 (ref 7.350–7.450)
pO2, Arterial: 73.7 mmHg — ABNORMAL LOW (ref 83.0–108.0)

## 2020-01-26 NOTE — Progress Notes (Signed)
[facility]   PULMONARY CRITICAL CARE SERVICE  PROGRESS NOTE  Date of Service: 01/26/2020  [name] [name]  ZDG:[id number]  DOB: [birth year]   DOA: 12/24/2019  Referring Physician: Merton Border, MD  HPI: [name] [name] is a 54 y.o. male seen for follow up of Acute on Chronic Respiratory Failure.  Patient currently is on T collar has been on 28% FiO2 now for 72 hours  Medications: Reviewed on Rounds  Physical Exam:  Vitals: Temperature 97.1 pulse 87 respiratory rate 29 blood pressure is 102/71 saturations 96%  Ventilator Settings on T collar FiO2 28% goal of 72 hours today  . General: Comfortable at this time . Eyes: Grossly normal lids, irises & conjunctiva . ENT: grossly tongue is normal . Neck: no obvious mass . Cardiovascular: S1 S2 normal no gallop . Respiratory: Coarse breath sounds no rhonchi or rales . Abdomen: soft . Skin: no rash seen on limited exam . Musculoskeletal: not rigid . Psychiatric:unable to assess . Neurologic: no seizure no involuntary movements         Lab Data:   Basic Metabolic Panel: Recent Labs  Lab 01/20/20 0544 01/23/20 0458 01/25/20 0500  NA 136 132* 136  K 4.5 4.4 4.2  CL 94* 88* 93*  CO2 23 21* 23  GLUCOSE 115* 117* 113*  BUN 143* 148* 109*  CREATININE 6.06* 6.86* 6.48*  CALCIUM 9.1 9.0 9.0  PHOS 4.1 5.5* 5.5*    ABG: Recent Labs  Lab 01/19/20 1508  PHART 7.324*  PCO2ART 48.8*  PO2ART 55.6*  HCO3 24.9  O2SAT 85.5    Liver Function Tests: Recent Labs  Lab 01/20/20 0544 01/23/20 0458 01/25/20 0500  ALBUMIN 3.6 3.7 3.7   No results for input(s): LIPASE, AMYLASE in the last 168 hours. No results for input(s): AMMONIA in the last 168 hours.  CBC: Recent Labs  Lab 01/20/20 0544 01/23/20 0458 01/25/20 0500  WBC 8.3 10.8* 15.1*  HGB 12.7* 12.6* 12.6*  HCT 41.5 40.1 42.0  MCV 90.0 88.1 90.9  PLT 270 211 214    Cardiac Enzymes: No results for input(s):  CKTOTAL, CKMB, CKMBINDEX, TROPONINI in the last 168 hours.  BNP (last 3 results) Recent Labs    11/16/19 1356  BNP 962.8*    ProBNP (last 3 results) No results for input(s): PROBNP in the last 8760 hours.  Radiological Exams: No results found.  Assessment/Plan Active Problems:   Acute on chronic respiratory failure with hypoxia (HCC)   End stage renal disease on dialysis (HCC)   Acute on chronic systolic and diastolic heart failure, NYHA class 4 (HCC)   Chronic atrial fibrillation (HCC)   Acute pulmonary embolism without acute cor pulmonale ([place])   1. Acute on chronic respiratory failure with hypoxia patient right now is on T collar has been on 28% FiO2 we will proceed to changing the tracheostomy out today to a cuffless type trach 2. End-stage renal disease on hemodialysis continue with nephrology recommendations 3. Acute on chronic systolic heart failure right now is compensated 4. Chronic atrial fibrillation rate controlled 5. Acute pulmonary embolism new changes noted patient has been treated we will continue to follow   I have personally seen and evaluated the patient, evaluated laboratory and imaging results, formulated the assessment and plan and placed orders. The Patient requires high complexity decision making with multiple systems involvement.  Rounds were done with the Respiratory Therapy Director and Staff therapists and discussed with nursing staff also.  Allyne Gee,  MD [facility] Pulmonary Critical Care Medicine Sleep Medicine

## 2020-01-27 DIAGNOSIS — I5040 Unspecified combined systolic (congestive) and diastolic (congestive) heart failure: Secondary | ICD-10-CM | POA: Diagnosis not present

## 2020-01-27 DIAGNOSIS — I4821 Permanent atrial fibrillation: Secondary | ICD-10-CM | POA: Diagnosis not present

## 2020-01-27 DIAGNOSIS — J9690 Respiratory failure, unspecified, unspecified whether with hypoxia or hypercapnia: Secondary | ICD-10-CM | POA: Diagnosis not present

## 2020-01-27 DIAGNOSIS — I2699 Other pulmonary embolism without acute cor pulmonale: Secondary | ICD-10-CM | POA: Diagnosis not present

## 2020-01-27 DIAGNOSIS — Z9911 Dependence on respirator [ventilator] status: Secondary | ICD-10-CM | POA: Diagnosis not present

## 2020-01-27 DIAGNOSIS — I313 Pericardial effusion (noninflammatory): Secondary | ICD-10-CM | POA: Diagnosis not present

## 2020-01-27 DIAGNOSIS — I959 Hypotension, unspecified: Secondary | ICD-10-CM | POA: Diagnosis not present

## 2020-01-27 DIAGNOSIS — Z992 Dependence on renal dialysis: Secondary | ICD-10-CM | POA: Diagnosis not present

## 2020-01-27 DIAGNOSIS — J9601 Acute respiratory failure with hypoxia: Secondary | ICD-10-CM | POA: Diagnosis not present

## 2020-01-27 DIAGNOSIS — N2581 Secondary hyperparathyroidism of renal origin: Secondary | ICD-10-CM | POA: Diagnosis not present

## 2020-01-27 DIAGNOSIS — D631 Anemia in chronic kidney disease: Secondary | ICD-10-CM | POA: Diagnosis not present

## 2020-01-27 DIAGNOSIS — I5043 Acute on chronic combined systolic (congestive) and diastolic (congestive) heart failure: Secondary | ICD-10-CM | POA: Diagnosis not present

## 2020-01-27 DIAGNOSIS — N186 End stage renal disease: Secondary | ICD-10-CM | POA: Diagnosis not present

## 2020-01-27 DIAGNOSIS — J9621 Acute and chronic respiratory failure with hypoxia: Secondary | ICD-10-CM | POA: Diagnosis not present

## 2020-01-27 DIAGNOSIS — I482 Chronic atrial fibrillation, unspecified: Secondary | ICD-10-CM | POA: Diagnosis not present

## 2020-01-27 LAB — RENAL FUNCTION PANEL
Albumin: 3.9 g/dL (ref 3.5–5.0)
Anion gap: 21 — ABNORMAL HIGH (ref 5–15)
BUN: 106 mg/dL — ABNORMAL HIGH (ref 6–20)
CO2: 23 mmol/L (ref 22–32)
Calcium: 9.3 mg/dL (ref 8.9–10.3)
Chloride: 93 mmol/L — ABNORMAL LOW (ref 98–111)
Creatinine, Ser: 6.31 mg/dL — ABNORMAL HIGH (ref 0.61–1.24)
GFR calc Af Amer: 11 mL/min — ABNORMAL LOW (ref >60)
GFR calc non Af Amer: 9 mL/min — ABNORMAL LOW (ref >60)
Glucose, Bld: 117 mg/dL — ABNORMAL HIGH (ref 70–99)
Phosphorus: 4.3 mg/dL (ref 2.5–4.6)
Potassium: 4 mmol/L (ref 3.5–5.1)
Sodium: 137 mmol/L (ref 135–145)

## 2020-01-27 LAB — MAGNESIUM: Magnesium: 2.2 mg/dL (ref 1.7–2.4)

## 2020-01-27 NOTE — Consult Note (Signed)
Ref: Jean Rosenthal, MD   Subjective:  Awake. Mild respiratory distress at rest.  Monitor shows SR with several episodes of VPCs and non sustained VT.   Objective:  Vital Signs in the last 24 hours:  T-97.1 degree F. P: 86, R: 20 and BP: 90/77  Physical Exam: BP Readings from Last 1 Encounters:  01/09/20 104/88     Wt Readings from Last 1 Encounters:  12/22/19 68.6 kg    Weight change:  There is no height or weight on file to calculate BMI. HEENT: [place]/AT, Eyes-Brown, PERL, EOMI, Conjunctiva-Pink, Sclera-Non-icteric Neck: No JVD, No bruit, Trachea midline. Lungs:  Clear, Bilateral. Cardiac:  Regular rhythm, normal S1 and S2, no S3. II/VI systolic murmur. Abdomen:  Soft, non-tender. BS present. Extremities:  1 + edema present. No cyanosis. No clubbing. Left foot and ankle has heavy dressing. Right foot chronic gangrene. CNS: AxOx3, Cranial nerves grossly intact, moves all 4 extremities.  Skin: Warm and dry.   Intake/Output from previous day: No intake/output data recorded.    Lab Results: BMET    Component Value Date/Time   NA 137 01/27/2020 1336   NA 136 01/25/2020 0500   NA 132 (L) 01/23/2020 0458   K 4.0 01/27/2020 1336   K 4.2 01/25/2020 0500   K 4.4 01/23/2020 0458   CL 93 (L) 01/27/2020 1336   CL 93 (L) 01/25/2020 0500   CL 88 (L) 01/23/2020 0458   CO2 23 01/27/2020 1336   CO2 23 01/25/2020 0500   CO2 21 (L) 01/23/2020 0458   GLUCOSE 117 (H) 01/27/2020 1336   GLUCOSE 113 (H) 01/25/2020 0500   GLUCOSE 117 (H) 01/23/2020 0458   BUN 106 (H) 01/27/2020 1336   BUN 109 (H) 01/25/2020 0500   BUN 148 (H) 01/23/2020 0458   CREATININE 6.31 (H) 01/27/2020 1336   CREATININE 6.48 (H) 01/25/2020 0500   CREATININE 6.86 (H) 01/23/2020 0458   CALCIUM 9.3 01/27/2020 1336   CALCIUM 9.0 01/25/2020 0500   CALCIUM 9.0 01/23/2020 0458   CALCIUM 8.6 (L) 12/23/2019 0323   CALCIUM 8.2 (L) 10/21/2010 1420   CALCIUM 8.7 06/24/2009 0542   GFRNONAA 9 (L) 01/27/2020 1336    GFRNONAA 9 (L) 01/25/2020 0500   GFRNONAA 8 (L) 01/23/2020 0458   GFRAA 11 (L) 01/27/2020 1336   GFRAA 11 (L) 01/25/2020 0500   GFRAA 10 (L) 01/23/2020 0458   CBC    Component Value Date/Time   WBC 15.1 (H) 01/25/2020 0500   RBC 4.62 01/25/2020 0500   HGB 12.6 (L) 01/25/2020 0500   HGB 12.3 (L) 12/31/2017 1012   HCT 42.0 01/25/2020 0500   HCT 37.4 (L) 12/31/2017 1012   PLT 214 01/25/2020 0500   PLT 176 12/31/2017 1012   MCV 90.9 01/25/2020 0500   MCV 99 (H) 12/31/2017 1012   MCH 27.3 01/25/2020 0500   MCHC 30.0 01/25/2020 0500   RDW 19.9 (H) 01/25/2020 0500   RDW 18.8 (H) 12/31/2017 1012   LYMPHSABS 1.1 12/17/2019 1504   LYMPHSABS 1.0 12/31/2017 1012   MONOABS 1.7 (H) 12/17/2019 1504   EOSABS 0.5 12/17/2019 1504   EOSABS 0.1 12/31/2017 1012   BASOSABS 0.1 12/17/2019 1504   BASOSABS 0.0 12/31/2017 1012   HEPATIC Function Panel Recent Labs    12/17/19 0017 12/19/19 0446 12/24/19 0539  PROT 8.1 7.4 7.6   HEMOGLOBIN A1C No components found for: HGA1C,  MPG CARDIAC ENZYMES Lab Results  Component Value Date   CKTOTAL 520 (H) 07/16/2012  CKMB 10.7 (HH) 07/16/2012   TROPONINI 0.22 (HH) 11/13/2017   TROPONINI 0.19 (HH) 11/12/2017   TROPONINI 0.26 (HH) 05/19/2017   BNP No results for input(s): PROBNP in the last 8760 hours. TSH Recent Labs    11/16/19 1730 12/25/19 0350  TSH 7.112* 6.933*   CHOLESTEROL No results for input(s): CHOL in the last 8760 hours.  Scheduled Meds: See list Continuous Infusions: PRN Meds:.lidocaine  Assessment/Plan: Acute on chronic respiratory failure with hypoxemia Moderate LV systolic dysfunction Mild pericardial effusion ESRD NSVT S/P cardiac arrest x 2 H/O PE Bilateral feet gangrene Anemia of chronic disease Chronic atrial fibrillation  Increase metoprolol dose as tolerated. Continue amiodarone.   LOS: 0 days   Time spent including chart review, lab review, examination, discussion with patient and nurse : 30  min   Dixie Dials  MD  01/27/2020, 8:28 PM

## 2020-01-27 NOTE — Progress Notes (Addendum)
Pulmonary [place]   PULMONARY CRITICAL CARE SERVICE  PROGRESS NOTE  Date of Service: 01/27/2020  [name] [name]  STM:[id number]  DOB: 03-17-1966   DOA: 12/24/2019  Referring Physician: Merton Border, MD  HPI: [name] [name] is a 54 y.o. male seen for follow up of Acute on Chronic Respiratory Failure.  Patient will continue on aerosol trach collar 35% this time has completed 72 hours on trach collar no fever distress.  Medications: Reviewed on Rounds  Physical Exam:  Vitals: Pulse 84 respirations 18 BP 121/81 O2 sat 94% temp 96.4  Ventilator Settings 35% ATC  . General: Comfortable at this time . Eyes: Grossly normal lids, irises & conjunctiva . ENT: grossly tongue is normal . Neck: no obvious mass . Cardiovascular: S1 S2 normal no gallop . Respiratory: No rales or rhonchi noted . Abdomen: soft . Skin: no rash seen on limited exam . Musculoskeletal: not rigid . Psychiatric:unable to assess . Neurologic: no seizure no involuntary movements         Lab Data:   Basic Metabolic Panel: Recent Labs  Lab 01/23/20 0458 01/25/20 0500 01/27/20 1336  NA 132* 136 137  K 4.4 4.2 4.0  CL 88* 93* 93*  CO2 21* 23 23  GLUCOSE 117* 113* 117*  BUN 148* 109* 106*  CREATININE 6.86* 6.48* 6.31*  CALCIUM 9.0 9.0 9.3  MG  --   --  2.2  PHOS 5.5* 5.5* 4.3    ABG: Recent Labs  Lab 01/26/20 1123  PHART 7.373  PCO2ART 48.0  PO2ART 73.7*  HCO3 27.4  O2SAT 94.5    Liver Function Tests: Recent Labs  Lab 01/23/20 0458 01/25/20 0500 01/27/20 1336  ALBUMIN 3.7 3.7 3.9   No results for input(s): LIPASE, AMYLASE in the last 168 hours. No results for input(s): AMMONIA in the last 168 hours.  CBC: Recent Labs  Lab 01/23/20 0458 01/25/20 0500  WBC 10.8* 15.1*  HGB 12.6* 12.6*  HCT 40.1 42.0  MCV 88.1 90.9  PLT 211 214    Cardiac Enzymes: No results for input(s): CKTOTAL, CKMB, CKMBINDEX, TROPONINI in the last 168  hours.  BNP (last 3 results) Recent Labs    11/16/19 1356  BNP 962.8*    ProBNP (last 3 results) No results for input(s): PROBNP in the last 8760 hours.  Radiological Exams: No results found.  Assessment/Plan Active Problems:   Acute on chronic respiratory failure with hypoxia (HCC)   End stage renal disease on dialysis (HCC)   Acute on chronic systolic and diastolic heart failure, NYHA class 4 (HCC)   Chronic atrial fibrillation (HCC)   Acute pulmonary embolism without acute cor pulmonale ([place])   1. Acute on chronic respiratory failure with hypoxia patient continue to wean following aerosol trach collar currently 85%.  Continue supportive measures and pulmonary toilet 2. End-stage renal disease on hemodialysis continue with nephrology recommendations 3. Acute on chronic systolic heart failure right now is compensated 4. Chronic atrial fibrillation rate controlled 5. Acute pulmonary embolism new changes noted patient has been treated we will continue to follow   I have personally seen and evaluated the patient, evaluated laboratory and imaging results, formulated the assessment and plan and placed orders. The Patient requires high complexity decision making with multiple systems involvement.  Rounds were done with the Respiratory Therapy Director and Staff therapists and discussed with nursing staff also.  Allyne Gee, MD [facility] Pulmonary Critical Care Medicine Sleep Medicine

## 2020-01-27 NOTE — Progress Notes (Signed)
[facility]  ROUNDING NOTE   Subjective:  Patient due for hemodialysis today. Orders have been prepared. Currently on the ventilator.  Objective:  Vital signs in last 24 hours:  Temperature 97.1 pulse 86 respirations 20 blood pressure 90/77  Physical Exam: General: Chronically ill-appearing  Head: Normocephalic, atraumatic. Moist oral mucosal membranes  Eyes: Anicteric  Neck: Tracheostomy present  Lungs:  Scattered rhonchi, vent assisted  Heart: S1S2 no rubs irregular  Abdomen:  Soft, nontender, bowel sounds present, distended  Extremities: 1+ peripheral edema.  Neurologic: Awake, alert, following commands  Skin: Warm/dry, normal skin turgor  Access: LUE AV access    Basic Metabolic Panel: Recent Labs  Lab 01/23/20 0458 01/25/20 0500  NA 132* 136  K 4.4 4.2  CL 88* 93*  CO2 21* 23  GLUCOSE 117* 113*  BUN 148* 109*  CREATININE 6.86* 6.48*  CALCIUM 9.0 9.0  PHOS 5.5* 5.5*    Liver Function Tests: Recent Labs  Lab 01/23/20 0458 01/25/20 0500  ALBUMIN 3.7 3.7   No results for input(s): LIPASE, AMYLASE in the last 168 hours. No results for input(s): AMMONIA in the last 168 hours.  CBC: Recent Labs  Lab 01/23/20 0458 01/25/20 0500  WBC 10.8* 15.1*  HGB 12.6* 12.6*  HCT 40.1 42.0  MCV 88.1 90.9  PLT 211 214    Cardiac Enzymes: No results for input(s): CKTOTAL, CKMB, CKMBINDEX, TROPONINI in the last 168 hours.  BNP: Invalid input(s): POCBNP  CBG: No results for input(s): GLUCAP in the last 168 hours.  Microbiology: Results for orders placed or performed during the hospital encounter of 11/16/19  SARS Coronavirus 2 by RT PCR (hospital order, performed in [facility] hospital lab) Nasopharyngeal Nasopharyngeal Swab     Status: None   Collection Time: 11/16/19 11:41 AM   Specimen: Nasopharyngeal Swab  Result Value Ref Range Status   SARS Coronavirus 2 NEGATIVE NEGATIVE Final    Comment: (NOTE) SARS-CoV-2 target nucleic acids are NOT  DETECTED. The SARS-CoV-2 RNA is generally detectable in upper and lower respiratory specimens during the acute phase of infection. The lowest concentration of SARS-CoV-2 viral copies this assay can detect is 250 copies / mL. A negative result does not preclude SARS-CoV-2 infection and should not be used as the sole basis for treatment or other patient management decisions.  A negative result may occur with improper specimen collection / handling, submission of specimen other than nasopharyngeal swab, presence of viral mutation(s) within the areas targeted by this assay, and inadequate number of viral copies (<250 copies / mL). A negative result must be combined with clinical observations, patient history, and epidemiological information. Fact Sheet for Patients:   StrictlyIdeas.no Fact Sheet for Healthcare Providers: BankingDealers.co.za This test is not yet approved or cleared  by the [place] FDA and has been authorized for detection and/or diagnosis of SARS-CoV-2 by FDA under an Emergency Use Authorization (EUA).  This EUA will remain in effect (meaning this test can be used) for the duration of the COVID-19 declaration under Section 564(b)(1) of the Act, 21 U.S.C. section 360bbb-3(b)(1), unless the authorization is terminated or revoked sooner. Performed at [facility], [place] [address] [place], [place] [address]   Culture, blood (routine x 2)     Status: None   Collection Time: 11/16/19  6:03 PM   Specimen: BLOOD RIGHT HAND  Result Value Ref Range Status   Specimen Description BLOOD RIGHT HAND  Final   Special Requests   Final  BOTTLES DRAWN AEROBIC AND ANAEROBIC Blood Culture results may not be optimal due to an inadequate volume of blood received in culture bottles   Culture   Final    NO GROWTH 5 DAYS Performed at [facility], [place] [address], [place], [place] [address]    Report Status 11/21/2019 FINAL   Final  MRSA PCR Screening     Status: None   Collection Time: 11/16/19  6:05 PM   Specimen: Nasal Mucosa; Nasopharyngeal  Result Value Ref Range Status   MRSA by PCR NEGATIVE NEGATIVE Final    Comment:        The GeneXpert MRSA Assay (FDA approved for NASAL specimens only), is one component of a comprehensive MRSA colonization surveillance program. It is not intended to diagnose MRSA infection nor to guide or monitor treatment for MRSA infections. Performed at [facility], [place] [address] [place], [place] [address]   Culture, blood (routine x 2)     Status: None   Collection Time: 11/16/19  6:08 PM   Specimen: BLOOD RIGHT HAND  Result Value Ref Range Status   Specimen Description BLOOD RIGHT HAND  Final   Special Requests   Final    BOTTLES DRAWN AEROBIC ONLY Blood Culture results may not be optimal due to an inadequate volume of blood received in culture bottles   Culture   Final    NO GROWTH 5 DAYS Performed at [facility], [place] [address], [place], [place] [address]    Report Status 11/21/2019 FINAL  Final  Culture, respiratory (non-expectorated)     Status: None   Collection Time: 11/18/19 12:28 PM   Specimen: Tracheal Aspirate; Respiratory  Result Value Ref Range Status   Specimen Description TRACHEAL ASPIRATE  Final   Special Requests NONE  Final   Gram Stain   Final    ABUNDANT WBC PRESENT,BOTH PMN AND MONONUCLEAR ABUNDANT GRAM POSITIVE COCCI ABUNDANT GRAM NEGATIVE RODS FEW GRAM VARIABLE ROD    Culture   Final    ABUNDANT HAEMOPHILUS INFLUENZAE BETA LACTAMASE POSITIVE Performed at [facility], [place] [address] [place], [place] [address]    Report Status 11/20/2019 FINAL  Final  Surgical pcr screen     Status: Abnormal   Collection Time: 11/24/19  7:53 AM   Specimen: Nasal Mucosa; Nasal Swab  Result Value Ref Range Status   MRSA, PCR NEGATIVE NEGATIVE Final   Staphylococcus aureus POSITIVE (A) NEGATIVE Final    Comment: (NOTE) The Xpert  SA Assay (FDA approved for NASAL specimens in patients 32 years of age and older), is one component of a comprehensive surveillance program. It is not intended to diagnose infection nor to guide or monitor treatment. Performed at [facility], [place] [address], [place], [place] [address]   Culture, blood (routine x 2)     Status: None   Collection Time: 11/25/19  3:30 PM   Specimen: BLOOD  Result Value Ref Range Status   Specimen Description BLOOD RIGHT ANTECUBITAL  Final   Special Requests   Final    BOTTLES DRAWN AEROBIC ONLY Blood Culture results may not be optimal due to an inadequate volume of blood received in culture bottles   Culture   Final    NO GROWTH 5 DAYS Performed at [facility], [place] [address], [place], [place] [address]    Report Status 11/30/2019 FINAL  Final  Culture, blood (routine x 2)     Status: None  Collection Time: 11/25/19  3:30 PM   Specimen: BLOOD RIGHT HAND  Result Value Ref Range Status   Specimen Description BLOOD RIGHT HAND  Final   Special Requests   Final    BOTTLES DRAWN AEROBIC AND ANAEROBIC Blood Culture results may not be optimal due to an inadequate volume of blood received in culture bottles   Culture   Final    NO GROWTH 5 DAYS Performed at [facility], [place] [address] [place], [place] [address]    Report Status 11/30/2019 FINAL  Final  Culture, respiratory (non-expectorated)     Status: None   Collection Time: 11/25/19  4:35 PM   Specimen: Tracheal Aspirate; Respiratory  Result Value Ref Range Status   Specimen Description TRACHEAL ASPIRATE  Final   Special Requests NONE  Final   Gram Stain   Final    ABUNDANT WBC PRESENT,BOTH PMN AND MONONUCLEAR FEW GRAM POSITIVE COCCI Performed at [facility], [place] [address], [place], [place] [address]    Culture FEW STAPHYLOCOCCUS EPIDERMIDIS  Final   Report Status 11/28/2019 FINAL  Final   Organism ID, Bacteria STAPHYLOCOCCUS EPIDERMIDIS  Final      Susceptibility    Staphylococcus epidermidis - MIC*    CIPROFLOXACIN <=0.5 SENSITIVE Sensitive     ERYTHROMYCIN >=8 RESISTANT Resistant     GENTAMICIN <=0.5 SENSITIVE Sensitive     OXACILLIN >=4 RESISTANT Resistant     TETRACYCLINE <=1 SENSITIVE Sensitive     VANCOMYCIN 2 SENSITIVE Sensitive     TRIMETH/SULFA <=10 SENSITIVE Sensitive     CLINDAMYCIN >=8 RESISTANT Resistant     RIFAMPIN <=0.5 SENSITIVE Sensitive     Inducible Clindamycin NEGATIVE Sensitive     * FEW STAPHYLOCOCCUS EPIDERMIDIS  Culture, respiratory (non-expectorated)     Status: None   Collection Time: 12/15/19  4:12 PM   Specimen: Tracheal Aspirate; Respiratory  Result Value Ref Range Status   Specimen Description TRACHEAL ASPIRATE  Final   Special Requests NONE  Final   Gram Stain   Final    MODERATE WBC PRESENT, PREDOMINANTLY PMN MODERATE GRAM POSITIVE COCCI IN PAIRS IN CLUSTERS Performed at [facility], [address] [place], [place] [address]    Culture MODERATE STAPHYLOCOCCUS AUREUS  Final   Report Status 12/17/2019 FINAL  Final   Organism ID, Bacteria STAPHYLOCOCCUS AUREUS  Final      Susceptibility   Staphylococcus aureus - MIC*    CIPROFLOXACIN <=0.5 SENSITIVE Sensitive     ERYTHROMYCIN <=0.25 SENSITIVE Sensitive     GENTAMICIN <=0.5 SENSITIVE Sensitive     OXACILLIN <=0.25 SENSITIVE Sensitive     TETRACYCLINE <=1 SENSITIVE Sensitive     VANCOMYCIN 1 SENSITIVE Sensitive     TRIMETH/SULFA <=10 SENSITIVE Sensitive     CLINDAMYCIN <=0.25 SENSITIVE Sensitive     RIFAMPIN <=0.5 SENSITIVE Sensitive     Inducible Clindamycin NEGATIVE Sensitive     * MODERATE STAPHYLOCOCCUS AUREUS  MRSA PCR Screening     Status: None   Collection Time: 12/17/19 10:25 AM   Specimen: Nasopharyngeal  Result Value Ref Range Status   MRSA by PCR NEGATIVE NEGATIVE Final    Comment:        The GeneXpert MRSA Assay (FDA approved for NASAL specimens only), is one component of a comprehensive MRSA colonization surveillance program. It is  not intended to diagnose MRSA infection nor to guide or monitor treatment for MRSA infections. Performed at [facility], [place],  [place] [address]   Culture, blood (routine x 2)     Status: None   Collection Time: 12/17/19  3:15 PM   Specimen: BLOOD  Result Value Ref Range Status   Specimen Description BLOOD RIGHT ANTECUBITAL  Final   Special Requests   Final    BOTTLES DRAWN AEROBIC AND ANAEROBIC Blood Culture results may not be optimal due to an inadequate volume of blood received in culture bottles   Culture   Final    NO GROWTH 5 DAYS Performed at [facility], [place] [address] [place], [place] [address]    Report Status 12/22/2019 FINAL  Final  Culture, blood (routine x 2)     Status: None   Collection Time: 12/17/19  3:20 PM   Specimen: BLOOD RIGHT HAND  Result Value Ref Range Status   Specimen Description BLOOD RIGHT HAND  Final   Special Requests   Final    BOTTLES DRAWN AEROBIC ONLY Blood Culture results may not be optimal due to an inadequate volume of blood received in culture bottles   Culture   Final    NO GROWTH 5 DAYS Performed at [facility], [place] [address] [place], [place] [address]    Report Status 12/22/2019 FINAL  Final    Coagulation Studies: No results for input(s): LABPROT, INR in the last 72 hours.  Urinalysis: No results for input(s): COLORURINE, LABSPEC, PHURINE, GLUCOSEU, HGBUR, BILIRUBINUR, KETONESUR, PROTEINUR, UROBILINOGEN, NITRITE, LEUKOCYTESUR in the last 72 hours.  Invalid input(s): APPERANCEUR    Imaging: No results found.   Medications:       Assessment/ Plan:  54 y.o. male with a PMHx of recent cardiac arrest status post hypothermia protocol, ESRD on HD MWF, anemia of chronic kidney disease, secondary hyperparathyroidism, history of polysubstance abuse, hypertension,, chronic systolic heart failure, medical nonadherence who was admitted to [facility] on 12/24/2019 for ongoing management of acute  respiratory failure.  1.  ESRD on HD MWF.  Patient is due for hemodialysis today.  Orders have been prepared.  2.  Anemia of chronic kidney disease.  Hemoglobin 12.6.  Continue to monitor closely.  3.  Secondary hyperparathyroidism.  Phosphorus remains within target range at 5.5.  Repeat phosphorus on Monday.  4.  Hyperkalemia.  Resolved.  5.  Hypotension.  Maintain the patient on albumin with dialysis treatments.   LOS: 0 Steffany Schoenfelder 2/12/20218:29 AM

## 2020-01-28 DIAGNOSIS — I5043 Acute on chronic combined systolic (congestive) and diastolic (congestive) heart failure: Secondary | ICD-10-CM | POA: Diagnosis not present

## 2020-01-28 DIAGNOSIS — I2699 Other pulmonary embolism without acute cor pulmonale: Secondary | ICD-10-CM | POA: Diagnosis not present

## 2020-01-28 DIAGNOSIS — Z9911 Dependence on respirator [ventilator] status: Secondary | ICD-10-CM | POA: Diagnosis not present

## 2020-01-28 DIAGNOSIS — I5040 Unspecified combined systolic (congestive) and diastolic (congestive) heart failure: Secondary | ICD-10-CM | POA: Diagnosis not present

## 2020-01-28 DIAGNOSIS — N186 End stage renal disease: Secondary | ICD-10-CM | POA: Diagnosis not present

## 2020-01-28 DIAGNOSIS — J9621 Acute and chronic respiratory failure with hypoxia: Secondary | ICD-10-CM | POA: Diagnosis not present

## 2020-01-28 DIAGNOSIS — I482 Chronic atrial fibrillation, unspecified: Secondary | ICD-10-CM | POA: Diagnosis not present

## 2020-01-28 DIAGNOSIS — Z992 Dependence on renal dialysis: Secondary | ICD-10-CM | POA: Diagnosis not present

## 2020-01-28 DIAGNOSIS — J9690 Respiratory failure, unspecified, unspecified whether with hypoxia or hypercapnia: Secondary | ICD-10-CM | POA: Diagnosis not present

## 2020-01-28 LAB — CBC
HCT: 41.9 % (ref 39.0–52.0)
Hemoglobin: 12.6 g/dL — ABNORMAL LOW (ref 13.0–17.0)
MCH: 27.8 pg (ref 26.0–34.0)
MCHC: 30.1 g/dL (ref 30.0–36.0)
MCV: 92.5 fL (ref 80.0–100.0)
Platelets: 102 10*3/uL — ABNORMAL LOW (ref 150–400)
RBC: 4.53 MIL/uL (ref 4.22–5.81)
RDW: 20.5 % — ABNORMAL HIGH (ref 11.5–15.5)
WBC: 11.6 10*3/uL — ABNORMAL HIGH (ref 4.0–10.5)
nRBC: 1.1 % — ABNORMAL HIGH (ref 0.0–0.2)

## 2020-01-28 LAB — MAGNESIUM: Magnesium: 2 mg/dL (ref 1.7–2.4)

## 2020-01-28 NOTE — Progress Notes (Addendum)
Pulmonary [place]   PULMONARY CRITICAL CARE SERVICE  PROGRESS NOTE  Date of Service: 01/28/2020  [name] [name]  CVE:[id number]  DOB: 1966/01/22   DOA: 12/24/2019  Referring Physician: Merton Border, MD  HPI: [name] [name] is a 54 y.o. male seen for follow up of Acute on Chronic Respiratory Failure.  Patient remains on 30% ATC satting well no fever or distress at this time.  Medications: Reviewed on Rounds  Physical Exam:  Vitals: Pulse 89 respirations 15 BP 108/86 O2 sat 96% temp 98.4  Ventilator Settings 30% ATC  . General: Comfortable at this time . Eyes: Grossly normal lids, irises & conjunctiva . ENT: grossly tongue is normal . Neck: no obvious mass . Cardiovascular: S1 S2 normal no gallop . Respiratory: No rales or rhonchi noted . Abdomen: soft . Skin: no rash seen on limited exam . Musculoskeletal: not rigid . Psychiatric:unable to assess . Neurologic: no seizure no involuntary movements         Lab Data:   Basic Metabolic Panel: Recent Labs  Lab 01/23/20 0458 01/25/20 0500 01/27/20 1336 01/28/20 0707  NA 132* 136 137  --   K 4.4 4.2 4.0  --   CL 88* 93* 93*  --   CO2 21* 23 23  --   GLUCOSE 117* 113* 117*  --   BUN 148* 109* 106*  --   CREATININE 6.86* 6.48* 6.31*  --   CALCIUM 9.0 9.0 9.3  --   MG  --   --  2.2 2.0  PHOS 5.5* 5.5* 4.3  --     ABG: Recent Labs  Lab 01/26/20 1123  PHART 7.373  PCO2ART 48.0  PO2ART 73.7*  HCO3 27.4  O2SAT 94.5    Liver Function Tests: Recent Labs  Lab 01/23/20 0458 01/25/20 0500 01/27/20 1336  ALBUMIN 3.7 3.7 3.9   No results for input(s): LIPASE, AMYLASE in the last 168 hours. No results for input(s): AMMONIA in the last 168 hours.  CBC: Recent Labs  Lab 01/23/20 0458 01/25/20 0500 01/28/20 0707  WBC 10.8* 15.1* 11.6*  HGB 12.6* 12.6* 12.6*  HCT 40.1 42.0 41.9  MCV 88.1 90.9 92.5  PLT 211 214 102*    Cardiac Enzymes: No results for input(s):  CKTOTAL, CKMB, CKMBINDEX, TROPONINI in the last 168 hours.  BNP (last 3 results) Recent Labs    11/16/19 1356  BNP 962.8*    ProBNP (last 3 results) No results for input(s): PROBNP in the last 8760 hours.  Radiological Exams: No results found.  Assessment/Plan Active Problems:   Acute on chronic respiratory failure with hypoxia (HCC)   End stage renal disease on dialysis (HCC)   Acute on chronic systolic and diastolic heart failure, NYHA class 4 (HCC)   Chronic atrial fibrillation (HCC)   Acute pulmonary embolism without acute cor pulmonale ([place])   1. Acute on chronic respiratory failure with hypoxia patient continues on ATC 30% at this time continue rest and pulmonary toilet supportive measures. 2. End-stage renal disease on hemodialysis continue with nephrology recommendations 3. Acute on chronic systolic heart failure right now is compensated 4. Chronic atrial fibrillation rate controlled 5. Acute pulmonary embolism new changes noted patient has been treated we will continue to follow   I have personally seen and evaluated the patient, evaluated laboratory and imaging results, formulated the assessment and plan and placed orders. The Patient requires high complexity decision making with multiple systems involvement.  Rounds were done with the Respiratory  Therapy Director and Staff therapists and discussed with nursing staff also.  Allyne Gee, MD [facility] Pulmonary Critical Care Medicine Sleep Medicine

## 2020-01-29 ENCOUNTER — Other Ambulatory Visit (HOSPITAL_COMMUNITY): Payer: Medicare Other

## 2020-01-29 DIAGNOSIS — I2699 Other pulmonary embolism without acute cor pulmonale: Secondary | ICD-10-CM | POA: Diagnosis not present

## 2020-01-29 DIAGNOSIS — I5040 Unspecified combined systolic (congestive) and diastolic (congestive) heart failure: Secondary | ICD-10-CM | POA: Diagnosis not present

## 2020-01-29 DIAGNOSIS — N186 End stage renal disease: Secondary | ICD-10-CM | POA: Diagnosis not present

## 2020-01-29 DIAGNOSIS — J9621 Acute and chronic respiratory failure with hypoxia: Secondary | ICD-10-CM | POA: Diagnosis not present

## 2020-01-29 DIAGNOSIS — I5043 Acute on chronic combined systolic (congestive) and diastolic (congestive) heart failure: Secondary | ICD-10-CM | POA: Diagnosis not present

## 2020-01-29 DIAGNOSIS — Z992 Dependence on renal dialysis: Secondary | ICD-10-CM | POA: Diagnosis not present

## 2020-01-29 DIAGNOSIS — Z9911 Dependence on respirator [ventilator] status: Secondary | ICD-10-CM | POA: Diagnosis not present

## 2020-01-29 DIAGNOSIS — R140 Abdominal distension (gaseous): Secondary | ICD-10-CM | POA: Diagnosis not present

## 2020-01-29 DIAGNOSIS — I482 Chronic atrial fibrillation, unspecified: Secondary | ICD-10-CM | POA: Diagnosis not present

## 2020-01-29 DIAGNOSIS — J9690 Respiratory failure, unspecified, unspecified whether with hypoxia or hypercapnia: Secondary | ICD-10-CM | POA: Diagnosis not present

## 2020-01-29 NOTE — Progress Notes (Addendum)
Pulmonary [place]   PULMONARY CRITICAL CARE SERVICE  PROGRESS NOTE  Date of Service: 01/29/2020  [name] [name]  GOT:[id number]  DOB: 03-12-1966   DOA: 12/24/2019  Referring Physician: Merton Border, MD  HPI: [name] [name] is a 54 y.o. male seen for follow up of Acute on Chronic Respiratory Failure.  Patient remains on 30% aerosol trach collar using PMV with no difficulty.  Medications: Reviewed on Rounds  Physical Exam:  Vitals: Pulse 90 respirations 23 BP 118/96 O2 sat 97% temp 96.6  Ventilator Settings 30% ATC  . General: Comfortable at this time . Eyes: Grossly normal lids, irises & conjunctiva . ENT: grossly tongue is normal . Neck: no obvious mass . Cardiovascular: S1 S2 normal no gallop . Respiratory: No rales or rhonchi noted . Abdomen: soft . Skin: no rash seen on limited exam . Musculoskeletal: not rigid . Psychiatric:unable to assess . Neurologic: no seizure no involuntary movements         Lab Data:   Basic Metabolic Panel: Recent Labs  Lab 01/23/20 0458 01/25/20 0500 01/27/20 1336 01/28/20 0707  NA 132* 136 137  --   K 4.4 4.2 4.0  --   CL 88* 93* 93*  --   CO2 21* 23 23  --   GLUCOSE 117* 113* 117*  --   BUN 148* 109* 106*  --   CREATININE 6.86* 6.48* 6.31*  --   CALCIUM 9.0 9.0 9.3  --   MG  --   --  2.2 2.0  PHOS 5.5* 5.5* 4.3  --     ABG: Recent Labs  Lab 01/26/20 1123  PHART 7.373  PCO2ART 48.0  PO2ART 73.7*  HCO3 27.4  O2SAT 94.5    Liver Function Tests: Recent Labs  Lab 01/23/20 0458 01/25/20 0500 01/27/20 1336  ALBUMIN 3.7 3.7 3.9   No results for input(s): LIPASE, AMYLASE in the last 168 hours. No results for input(s): AMMONIA in the last 168 hours.  CBC: Recent Labs  Lab 01/23/20 0458 01/25/20 0500 01/28/20 0707  WBC 10.8* 15.1* 11.6*  HGB 12.6* 12.6* 12.6*  HCT 40.1 42.0 41.9  MCV 88.1 90.9 92.5  PLT 211 214 102*    Cardiac Enzymes: No results for input(s):  CKTOTAL, CKMB, CKMBINDEX, TROPONINI in the last 168 hours.  BNP (last 3 results) Recent Labs    11/16/19 1356  BNP 962.8*    ProBNP (last 3 results) No results for input(s): PROBNP in the last 8760 hours.  Radiological Exams: DG Abd 1 View  Result Date: 01/29/2020 CLINICAL DATA:  Abdominal distension. EXAM: ABDOMEN - 1 VIEW COMPARISON:  01/04/2020 FINDINGS: There is a gastrostomy tube within the left upper quadrant of the abdomen. Unchanged in position. No dilated loops of small bowel identified. Normal caliber air-filled loop of transverse colon, sigmoid colon and rectum noted. IMPRESSION: Nonobstructive bowel gas pattern. Electronically Signed   By: Kerby Moors M.D.   On: 01/29/2020 10:05    Assessment/Plan Active Problems:   Acute on chronic respiratory failure with hypoxia (HCC)   End stage renal disease on dialysis (HCC)   Acute on chronic systolic and diastolic heart failure, NYHA class 4 (HCC)   Chronic atrial fibrillation ([place])   Acute pulmonary embolism without acute cor pulmonale ([place])   1. Acute on chronic respiratory failure with hypoxia patient continues on ATC 30% continue pulmonary toilet and supportive measures. 2. End-stage renal disease on hemodialysis continue with nephrology recommendations 3. Acute on chronic systolic  heart failure right now is compensated 4. Chronic atrial fibrillation rate controlled 5. Acute pulmonary embolism new changes noted patient has been treated we will continue to follow   I have personally seen and evaluated the patient, evaluated laboratory and imaging results, formulated the assessment and plan and placed orders. The Patient requires high complexity decision making with multiple systems involvement.  Rounds were done with the Respiratory Therapy Director and Staff therapists and discussed with nursing staff also.  Allyne Gee, MD [facility] Pulmonary Critical Care Medicine Sleep Medicine

## 2020-01-30 ENCOUNTER — Ambulatory Visit: Payer: Medicare Other | Admitting: Student

## 2020-01-30 DIAGNOSIS — I5043 Acute on chronic combined systolic (congestive) and diastolic (congestive) heart failure: Secondary | ICD-10-CM | POA: Diagnosis not present

## 2020-01-30 DIAGNOSIS — I2699 Other pulmonary embolism without acute cor pulmonale: Secondary | ICD-10-CM | POA: Diagnosis not present

## 2020-01-30 DIAGNOSIS — J9621 Acute and chronic respiratory failure with hypoxia: Secondary | ICD-10-CM | POA: Diagnosis not present

## 2020-01-30 DIAGNOSIS — Z9911 Dependence on respirator [ventilator] status: Secondary | ICD-10-CM | POA: Diagnosis not present

## 2020-01-30 DIAGNOSIS — Z992 Dependence on renal dialysis: Secondary | ICD-10-CM | POA: Diagnosis not present

## 2020-01-30 DIAGNOSIS — I959 Hypotension, unspecified: Secondary | ICD-10-CM | POA: Diagnosis not present

## 2020-01-30 DIAGNOSIS — J9690 Respiratory failure, unspecified, unspecified whether with hypoxia or hypercapnia: Secondary | ICD-10-CM | POA: Diagnosis not present

## 2020-01-30 DIAGNOSIS — N186 End stage renal disease: Secondary | ICD-10-CM | POA: Diagnosis not present

## 2020-01-30 DIAGNOSIS — D631 Anemia in chronic kidney disease: Secondary | ICD-10-CM | POA: Diagnosis not present

## 2020-01-30 DIAGNOSIS — I482 Chronic atrial fibrillation, unspecified: Secondary | ICD-10-CM | POA: Diagnosis not present

## 2020-01-30 DIAGNOSIS — I5040 Unspecified combined systolic (congestive) and diastolic (congestive) heart failure: Secondary | ICD-10-CM | POA: Diagnosis not present

## 2020-01-30 DIAGNOSIS — N2581 Secondary hyperparathyroidism of renal origin: Secondary | ICD-10-CM | POA: Diagnosis not present

## 2020-01-30 LAB — RENAL FUNCTION PANEL
Albumin: 3.9 g/dL (ref 3.5–5.0)
Anion gap: 24 — ABNORMAL HIGH (ref 5–15)
BUN: 115 mg/dL — ABNORMAL HIGH (ref 6–20)
CO2: 21 mmol/L — ABNORMAL LOW (ref 22–32)
Calcium: 9.2 mg/dL (ref 8.9–10.3)
Chloride: 90 mmol/L — ABNORMAL LOW (ref 98–111)
Creatinine, Ser: 6.57 mg/dL — ABNORMAL HIGH (ref 0.61–1.24)
GFR calc Af Amer: 10 mL/min — ABNORMAL LOW (ref >60)
GFR calc non Af Amer: 9 mL/min — ABNORMAL LOW (ref >60)
Glucose, Bld: 100 mg/dL — ABNORMAL HIGH (ref 70–99)
Phosphorus: 4.5 mg/dL (ref 2.5–4.6)
Potassium: 3.7 mmol/L (ref 3.5–5.1)
Sodium: 135 mmol/L (ref 135–145)

## 2020-01-30 LAB — CBC
HCT: 40 % (ref 39.0–52.0)
Hemoglobin: 12.5 g/dL — ABNORMAL LOW (ref 13.0–17.0)
MCH: 27.4 pg (ref 26.0–34.0)
MCHC: 31.3 g/dL (ref 30.0–36.0)
MCV: 87.5 fL (ref 80.0–100.0)
Platelets: 142 10*3/uL — ABNORMAL LOW (ref 150–400)
RBC: 4.57 MIL/uL (ref 4.22–5.81)
RDW: 20.4 % — ABNORMAL HIGH (ref 11.5–15.5)
WBC: 10 10*3/uL (ref 4.0–10.5)
nRBC: 1.4 % — ABNORMAL HIGH (ref 0.0–0.2)

## 2020-01-30 NOTE — Progress Notes (Signed)
[facility]  ROUNDING NOTE   Subjective:  Patient seen and evaluated during hemodialysis. Tolerating well. Blood flow rate 400.  Objective:  Vital signs in last 24 hours:  Temperature 97.2 pulse 90 respirations 16 blood pressure 125/76  Physical Exam: General: Chronically ill-appearing  Head: Normocephalic, atraumatic. Moist oral mucosal membranes  Eyes: Anicteric  Neck: Tracheostomy present  Lungs:  Scattered rhonchi, vent assisted  Heart: S1S2 no rubs irregular  Abdomen:  Soft, nontender, bowel sounds present, distended  Extremities: 1+ peripheral edema.  Neurologic: Awake, alert, following commands  Skin: Warm/dry, normal skin turgor  Access: LUE AV access    Basic Metabolic Panel: Recent Labs  Lab 01/25/20 0500 01/27/20 1336 01/28/20 0707 01/30/20 0810  NA 136 137  --  135  K 4.2 4.0  --  3.7  CL 93* 93*  --  90*  CO2 23 23  --  21*  GLUCOSE 113* 117*  --  100*  BUN 109* 106*  --  115*  CREATININE 6.48* 6.31*  --  6.57*  CALCIUM 9.0 9.3  --  9.2  MG  --  2.2 2.0  --   PHOS 5.5* 4.3  --  4.5    Liver Function Tests: Recent Labs  Lab 01/25/20 0500 01/27/20 1336 01/30/20 0810  ALBUMIN 3.7 3.9 3.9   No results for input(s): LIPASE, AMYLASE in the last 168 hours. No results for input(s): AMMONIA in the last 168 hours.  CBC: Recent Labs  Lab 01/25/20 0500 01/28/20 0707 01/30/20 0810  WBC 15.1* 11.6* 10.0  HGB 12.6* 12.6* 12.5*  HCT 42.0 41.9 40.0  MCV 90.9 92.5 87.5  PLT 214 102* 142*    Cardiac Enzymes: No results for input(s): CKTOTAL, CKMB, CKMBINDEX, TROPONINI in the last 168 hours.  BNP: Invalid input(s): POCBNP  CBG: No results for input(s): GLUCAP in the last 168 hours.  Microbiology: Results for orders placed or performed during the hospital encounter of 11/16/19  SARS Coronavirus 2 by RT PCR (hospital order, performed in [facility] hospital lab) Nasopharyngeal Nasopharyngeal Swab     Status: None   Collection Time:  11/16/19 11:41 AM   Specimen: Nasopharyngeal Swab  Result Value Ref Range Status   SARS Coronavirus 2 NEGATIVE NEGATIVE Final    Comment: (NOTE) SARS-CoV-2 target nucleic acids are NOT DETECTED. The SARS-CoV-2 RNA is generally detectable in upper and lower respiratory specimens during the acute phase of infection. The lowest concentration of SARS-CoV-2 viral copies this assay can detect is 250 copies / mL. A negative result does not preclude SARS-CoV-2 infection and should not be used as the sole basis for treatment or other patient management decisions.  A negative result may occur with improper specimen collection / handling, submission of specimen other than nasopharyngeal swab, presence of viral mutation(s) within the areas targeted by this assay, and inadequate number of viral copies (<250 copies / mL). A negative result must be combined with clinical observations, patient history, and epidemiological information. Fact Sheet for Patients:   StrictlyIdeas.no Fact Sheet for Healthcare Providers: BankingDealers.co.za This test is not yet approved or cleared  by the [place] FDA and has been authorized for detection and/or diagnosis of SARS-CoV-2 by FDA under an Emergency Use Authorization (EUA).  This EUA will remain in effect (meaning this test can be used) for the duration of the COVID-19 declaration under Section 564(b)(1) of the Act, 21 U.S.C. section 360bbb-3(b)(1), unless the authorization is terminated or revoked sooner. Performed at [facility],  [address], [place], [place] [address]   Culture, blood (routine x 2)     Status: None   Collection Time: 11/16/19  6:03 PM   Specimen: BLOOD RIGHT HAND  Result Value Ref Range Status   Specimen Description BLOOD RIGHT HAND  Final   Special Requests   Final    BOTTLES DRAWN AEROBIC AND ANAEROBIC Blood Culture results may not be optimal due to an inadequate volume of  blood received in culture bottles   Culture   Final    NO GROWTH 5 DAYS Performed at [facility], [place] [address] [place], [place] [address]    Report Status 11/21/2019 FINAL  Final  MRSA PCR Screening     Status: None   Collection Time: 11/16/19  6:05 PM   Specimen: Nasal Mucosa; Nasopharyngeal  Result Value Ref Range Status   MRSA by PCR NEGATIVE NEGATIVE Final    Comment:        The GeneXpert MRSA Assay (FDA approved for NASAL specimens only), is one component of a comprehensive MRSA colonization surveillance program. It is not intended to diagnose MRSA infection nor to guide or monitor treatment for MRSA infections. Performed at [facility], [place] [address] [place], [place] [address]   Culture, blood (routine x 2)     Status: None   Collection Time: 11/16/19  6:08 PM   Specimen: BLOOD RIGHT HAND  Result Value Ref Range Status   Specimen Description BLOOD RIGHT HAND  Final   Special Requests   Final    BOTTLES DRAWN AEROBIC ONLY Blood Culture results may not be optimal due to an inadequate volume of blood received in culture bottles   Culture   Final    NO GROWTH 5 DAYS Performed at [facility], [place] [address], [place], [place] [address]    Report Status 11/21/2019 FINAL  Final  Culture, respiratory (non-expectorated)     Status: None   Collection Time: 11/18/19 12:28 PM   Specimen: Tracheal Aspirate; Respiratory  Result Value Ref Range Status   Specimen Description TRACHEAL ASPIRATE  Final   Special Requests NONE  Final   Gram Stain   Final    ABUNDANT WBC PRESENT,BOTH PMN AND MONONUCLEAR ABUNDANT GRAM POSITIVE COCCI ABUNDANT GRAM NEGATIVE RODS FEW GRAM VARIABLE ROD    Culture   Final    ABUNDANT HAEMOPHILUS INFLUENZAE BETA LACTAMASE POSITIVE Performed at [facility], [place] [address] [place], [place] [address]    Report Status 11/20/2019 FINAL  Final  Surgical pcr screen     Status: Abnormal   Collection Time: 11/24/19  7:53 AM    Specimen: Nasal Mucosa; Nasal Swab  Result Value Ref Range Status   MRSA, PCR NEGATIVE NEGATIVE Final   Staphylococcus aureus POSITIVE (A) NEGATIVE Final    Comment: (NOTE) The Xpert SA Assay (FDA approved for NASAL specimens in patients 73 years of age and older), is one component of a comprehensive surveillance program. It is not intended to diagnose infection nor to guide or monitor treatment. Performed at [facility], [place] [address], [place], [place] [address]   Culture, blood (routine x 2)     Status: None   Collection Time: 11/25/19  3:30 PM   Specimen: BLOOD  Result Value Ref Range Status   Specimen Description BLOOD RIGHT ANTECUBITAL  Final   Special Requests   Final    BOTTLES DRAWN AEROBIC ONLY Blood Culture results may not be optimal  due to an inadequate volume of blood received in culture bottles   Culture   Final    NO GROWTH 5 DAYS Performed at [facility], [place] [address], [place], [place] [address]    Report Status 11/30/2019 FINAL  Final  Culture, blood (routine x 2)     Status: None   Collection Time: 11/25/19  3:30 PM   Specimen: BLOOD RIGHT HAND  Result Value Ref Range Status   Specimen Description BLOOD RIGHT HAND  Final   Special Requests   Final    BOTTLES DRAWN AEROBIC AND ANAEROBIC Blood Culture results may not be optimal due to an inadequate volume of blood received in culture bottles   Culture   Final    NO GROWTH 5 DAYS Performed at [facility], [place] [address] [place], [place] [address]    Report Status 11/30/2019 FINAL  Final  Culture, respiratory (non-expectorated)     Status: None   Collection Time: 11/25/19  4:35 PM   Specimen: Tracheal Aspirate; Respiratory  Result Value Ref Range Status   Specimen Description TRACHEAL ASPIRATE  Final   Special Requests NONE  Final   Gram Stain   Final    ABUNDANT WBC PRESENT,BOTH PMN AND MONONUCLEAR FEW GRAM POSITIVE COCCI Performed at [facility], [place] [address],  [place], [place] [address]    Culture FEW STAPHYLOCOCCUS EPIDERMIDIS  Final   Report Status 11/28/2019 FINAL  Final   Organism ID, Bacteria STAPHYLOCOCCUS EPIDERMIDIS  Final      Susceptibility   Staphylococcus epidermidis - MIC*    CIPROFLOXACIN <=0.5 SENSITIVE Sensitive     ERYTHROMYCIN >=8 RESISTANT Resistant     GENTAMICIN <=0.5 SENSITIVE Sensitive     OXACILLIN >=4 RESISTANT Resistant     TETRACYCLINE <=1 SENSITIVE Sensitive     VANCOMYCIN 2 SENSITIVE Sensitive     TRIMETH/SULFA <=10 SENSITIVE Sensitive     CLINDAMYCIN >=8 RESISTANT Resistant     RIFAMPIN <=0.5 SENSITIVE Sensitive     Inducible Clindamycin NEGATIVE Sensitive     * FEW STAPHYLOCOCCUS EPIDERMIDIS  Culture, respiratory (non-expectorated)     Status: None   Collection Time: 12/15/19  4:12 PM   Specimen: Tracheal Aspirate; Respiratory  Result Value Ref Range Status   Specimen Description TRACHEAL ASPIRATE  Final   Special Requests NONE  Final   Gram Stain   Final    MODERATE WBC PRESENT, PREDOMINANTLY PMN MODERATE GRAM POSITIVE COCCI IN PAIRS IN CLUSTERS Performed at [facility], [address] [place], [place] [address]    Culture MODERATE STAPHYLOCOCCUS AUREUS  Final   Report Status 12/17/2019 FINAL  Final   Organism ID, Bacteria STAPHYLOCOCCUS AUREUS  Final      Susceptibility   Staphylococcus aureus - MIC*    CIPROFLOXACIN <=0.5 SENSITIVE Sensitive     ERYTHROMYCIN <=0.25 SENSITIVE Sensitive     GENTAMICIN <=0.5 SENSITIVE Sensitive     OXACILLIN <=0.25 SENSITIVE Sensitive     TETRACYCLINE <=1 SENSITIVE Sensitive     VANCOMYCIN 1 SENSITIVE Sensitive     TRIMETH/SULFA <=10 SENSITIVE Sensitive     CLINDAMYCIN <=0.25 SENSITIVE Sensitive     RIFAMPIN <=0.5 SENSITIVE Sensitive     Inducible Clindamycin NEGATIVE Sensitive     * MODERATE STAPHYLOCOCCUS AUREUS  MRSA PCR Screening     Status: None   Collection Time: 12/17/19 10:25 AM   Specimen: Nasopharyngeal  Result Value Ref Range Status   MRSA by PCR  NEGATIVE NEGATIVE  Final    Comment:        The GeneXpert MRSA Assay (FDA approved for NASAL specimens only), is one component of a comprehensive MRSA colonization surveillance program. It is not intended to diagnose MRSA infection nor to guide or monitor treatment for MRSA infections. Performed at [facility], [place] [address] [place], [place] [address]   Culture, blood (routine x 2)     Status: None   Collection Time: 12/17/19  3:15 PM   Specimen: BLOOD  Result Value Ref Range Status   Specimen Description BLOOD RIGHT ANTECUBITAL  Final   Special Requests   Final    BOTTLES DRAWN AEROBIC AND ANAEROBIC Blood Culture results may not be optimal due to an inadequate volume of blood received in culture bottles   Culture   Final    NO GROWTH 5 DAYS Performed at [facility], [place] [address], [place], [place] [address]    Report Status 12/22/2019 FINAL  Final  Culture, blood (routine x 2)     Status: None   Collection Time: 12/17/19  3:20 PM   Specimen: BLOOD RIGHT HAND  Result Value Ref Range Status   Specimen Description BLOOD RIGHT HAND  Final   Special Requests   Final    BOTTLES DRAWN AEROBIC ONLY Blood Culture results may not be optimal due to an inadequate volume of blood received in culture bottles   Culture   Final    NO GROWTH 5 DAYS Performed at [facility], [place] [address] [place], [place] [address]    Report Status 12/22/2019 FINAL  Final    Coagulation Studies: No results for input(s): LABPROT, INR in the last 72 hours.  Urinalysis: No results for input(s): COLORURINE, LABSPEC, PHURINE, GLUCOSEU, HGBUR, BILIRUBINUR, KETONESUR, PROTEINUR, UROBILINOGEN, NITRITE, LEUKOCYTESUR in the last 72 hours.  Invalid input(s): APPERANCEUR    Imaging: DG Abd 1 View  Result Date: 01/29/2020 CLINICAL DATA:  Abdominal distension. EXAM: ABDOMEN - 1 VIEW COMPARISON:  01/04/2020 FINDINGS: There is a gastrostomy tube within the left upper quadrant of the  abdomen. Unchanged in position. No dilated loops of small bowel identified. Normal caliber air-filled loop of transverse colon, sigmoid colon and rectum noted. IMPRESSION: Nonobstructive bowel gas pattern. Electronically Signed   By: Kerby Moors M.D.   On: 01/29/2020 10:05     Medications:       Assessment/ Plan:  54 y.o. male with a PMHx of recent cardiac arrest status post hypothermia protocol, ESRD on HD MWF, anemia of chronic kidney disease, secondary hyperparathyroidism, history of polysubstance abuse, hypertension,, chronic systolic heart failure, medical nonadherence who was admitted to [facility] on 12/24/2019 for ongoing management of acute respiratory failure.  1.  ESRD on HD MWF.  Patient seen and evaluated during dialysis treatment.  Tolerating well.  Blood flow rate 400.  Complete dialysis today and next dialysis will be on Wednesday.  2.  Anemia of chronic kidney disease.  Hemoglobin currently 12.5.  3.  Secondary hyperparathyroidism.  Phosphorus 4.5 and acceptable.  4.  Hyperkalemia.  Resolved.  5.  Hypotension.  Continue albumin with dialysis..   LOS: 0 [name] [name] 2/15/20219:35 AM

## 2020-01-30 NOTE — Progress Notes (Signed)
[facility] [place]   PULMONARY CRITICAL CARE SERVICE  PROGRESS NOTE  Date of Service: 01/30/2020  [name] [name]  IZT:[id number]  DOB: 20-Apr-1966   DOA: 12/24/2019  Referring Physician: Merton Border, MD  HPI: [name] [name] is a 54 y.o. male seen for follow up of Acute on Chronic Respiratory Failure.  Patient at this time is on T collar has been on 28% FiO2 doing fine with PMV we should be able to start capping trials  Medications: Reviewed on Rounds  Physical Exam:  Vitals: Temperature is 97.2 pulse 90 respiratory rate 16 blood pressure is 125/76 saturations 97%  Ventilator Settings off the ventilator on T collar and currently on 28% FiO2  . General: Comfortable at this time . Eyes: Grossly normal lids, irises & conjunctiva . ENT: grossly tongue is normal . Neck: no obvious mass . Cardiovascular: S1 S2 normal no gallop . Respiratory: No rhonchi no rales are noted at this time . Abdomen: soft . Skin: no rash seen on limited exam . Musculoskeletal: not rigid . Psychiatric:unable to assess . Neurologic: no seizure no involuntary movements         Lab Data:   Basic Metabolic Panel: Recent Labs  Lab 01/25/20 0500 01/27/20 1336 01/28/20 0707 01/30/20 0810  NA 136 137  --  135  K 4.2 4.0  --  3.7  CL 93* 93*  --  90*  CO2 23 23  --  21*  GLUCOSE 113* 117*  --  100*  BUN 109* 106*  --  115*  CREATININE 6.48* 6.31*  --  6.57*  CALCIUM 9.0 9.3  --  9.2  MG  --  2.2 2.0  --   PHOS 5.5* 4.3  --  4.5    ABG: Recent Labs  Lab 01/26/20 1123  PHART 7.373  PCO2ART 48.0  PO2ART 73.7*  HCO3 27.4  O2SAT 94.5    Liver Function Tests: Recent Labs  Lab 01/25/20 0500 01/27/20 1336 01/30/20 0810  ALBUMIN 3.7 3.9 3.9   No results for input(s): LIPASE, AMYLASE in the last 168 hours. No results for input(s): AMMONIA in the last 168 hours.  CBC: Recent Labs  Lab 01/25/20 0500 01/28/20 0707 01/30/20 0810  WBC 15.1*  11.6* 10.0  HGB 12.6* 12.6* 12.5*  HCT 42.0 41.9 40.0  MCV 90.9 92.5 87.5  PLT 214 102* 142*    Cardiac Enzymes: No results for input(s): CKTOTAL, CKMB, CKMBINDEX, TROPONINI in the last 168 hours.  BNP (last 3 results) Recent Labs    11/16/19 1356  BNP 962.8*    ProBNP (last 3 results) No results for input(s): PROBNP in the last 8760 hours.  Radiological Exams: DG Abd 1 View  Result Date: 01/29/2020 CLINICAL DATA:  Abdominal distension. EXAM: ABDOMEN - 1 VIEW COMPARISON:  01/04/2020 FINDINGS: There is a gastrostomy tube within the left upper quadrant of the abdomen. Unchanged in position. No dilated loops of small bowel identified. Normal caliber air-filled loop of transverse colon, sigmoid colon and rectum noted. IMPRESSION: Nonobstructive bowel gas pattern. Electronically Signed   By: Kerby Moors M.D.   On: 01/29/2020 10:05    Assessment/Plan Active Problems:   Acute on chronic respiratory failure with hypoxia (HCC)   End stage renal disease on dialysis (HCC)   Acute on chronic systolic and diastolic heart failure, NYHA class 4 (HCC)   Chronic atrial fibrillation ([place])   Acute pulmonary embolism without acute cor pulmonale ([place])   1. Acute on chronic respiratory  failure with hypoxia continue with capping trials as ordered titrate oxygen continue pulmonary toilet 2. End-stage renal disease on hemodialysis 3. Acute on chronic systolic and diastolic heart failure follow fluid status diuretics as tolerated. 4. Chronic atrial fibrillation rate is controlled 5. Acute pulmonary embolism no changes noted at this time   I have personally seen and evaluated the patient, evaluated laboratory and imaging results, formulated the assessment and plan and placed orders. The Patient requires high complexity decision making with multiple systems involvement.  Rounds were done with the Respiratory Therapy Director and Staff therapists and discussed with nursing staff also.  Allyne Gee, MD [facility] Pulmonary Critical Care Medicine Sleep Medicine

## 2020-01-31 DIAGNOSIS — Z992 Dependence on renal dialysis: Secondary | ICD-10-CM | POA: Diagnosis not present

## 2020-01-31 DIAGNOSIS — I482 Chronic atrial fibrillation, unspecified: Secondary | ICD-10-CM | POA: Diagnosis not present

## 2020-01-31 DIAGNOSIS — I2699 Other pulmonary embolism without acute cor pulmonale: Secondary | ICD-10-CM | POA: Diagnosis not present

## 2020-01-31 DIAGNOSIS — I5040 Unspecified combined systolic (congestive) and diastolic (congestive) heart failure: Secondary | ICD-10-CM | POA: Diagnosis not present

## 2020-01-31 DIAGNOSIS — J9621 Acute and chronic respiratory failure with hypoxia: Secondary | ICD-10-CM | POA: Diagnosis not present

## 2020-01-31 DIAGNOSIS — N186 End stage renal disease: Secondary | ICD-10-CM | POA: Diagnosis not present

## 2020-01-31 DIAGNOSIS — I5043 Acute on chronic combined systolic (congestive) and diastolic (congestive) heart failure: Secondary | ICD-10-CM | POA: Diagnosis not present

## 2020-01-31 DIAGNOSIS — J9690 Respiratory failure, unspecified, unspecified whether with hypoxia or hypercapnia: Secondary | ICD-10-CM | POA: Diagnosis not present

## 2020-01-31 DIAGNOSIS — Z9911 Dependence on respirator [ventilator] status: Secondary | ICD-10-CM | POA: Diagnosis not present

## 2020-01-31 NOTE — Progress Notes (Signed)
Pulmonary [place]   PULMONARY CRITICAL CARE SERVICE  PROGRESS NOTE  Date of Service: 01/31/2020  [name] [name]  KDT:[id number]  DOB: 1966-04-12   DOA: 12/24/2019  Referring Physician: Merton Border, MD  HPI: [name] [name] is a 54 y.o. male seen for follow up of Acute on Chronic Respiratory Failure.  Patient currently is capping today will be 24 hours and right now patient is on room air  Medications: Reviewed on Rounds  Physical Exam:  Vitals: Temperature 96.4 pulse 88 respiratory 19 blood pressure is 114/80 saturations 100%  Ventilator Settings capping on room air  . General: Comfortable at this time . Eyes: Grossly normal lids, irises & conjunctiva . ENT: grossly tongue is normal . Neck: no obvious mass . Cardiovascular: S1 S2 normal no gallop . Respiratory: No rhonchi no rales are noted . Abdomen: soft . Skin: no rash seen on limited exam . Musculoskeletal: not rigid . Psychiatric:unable to assess . Neurologic: no seizure no involuntary movements         Lab Data:   Basic Metabolic Panel: Recent Labs  Lab 01/25/20 0500 01/27/20 1336 01/28/20 0707 01/30/20 0810  NA 136 137  --  135  K 4.2 4.0  --  3.7  CL 93* 93*  --  90*  CO2 23 23  --  21*  GLUCOSE 113* 117*  --  100*  BUN 109* 106*  --  115*  CREATININE 6.48* 6.31*  --  6.57*  CALCIUM 9.0 9.3  --  9.2  MG  --  2.2 2.0  --   PHOS 5.5* 4.3  --  4.5    ABG: Recent Labs  Lab 01/26/20 1123  PHART 7.373  PCO2ART 48.0  PO2ART 73.7*  HCO3 27.4  O2SAT 94.5    Liver Function Tests: Recent Labs  Lab 01/25/20 0500 01/27/20 1336 01/30/20 0810  ALBUMIN 3.7 3.9 3.9   No results for input(s): LIPASE, AMYLASE in the last 168 hours. No results for input(s): AMMONIA in the last 168 hours.  CBC: Recent Labs  Lab 01/25/20 0500 01/28/20 0707 01/30/20 0810  WBC 15.1* 11.6* 10.0  HGB 12.6* 12.6* 12.5*  HCT 42.0 41.9 40.0  MCV 90.9 92.5 87.5  PLT 214 102*  142*    Cardiac Enzymes: No results for input(s): CKTOTAL, CKMB, CKMBINDEX, TROPONINI in the last 168 hours.  BNP (last 3 results) Recent Labs    11/16/19 1356  BNP 962.8*    ProBNP (last 3 results) No results for input(s): PROBNP in the last 8760 hours.  Radiological Exams: No results found.  Assessment/Plan Active Problems:   Acute on chronic respiratory failure with hypoxia (HCC)   End stage renal disease on dialysis (HCC)   Acute on chronic systolic and diastolic heart failure, NYHA class 4 (HCC)   Chronic atrial fibrillation (HCC)   Acute pulmonary embolism without acute cor pulmonale ([place])   1. Acute on chronic respiratory failure with hypoxia plan is to continue with Dialysis patient is on room air today will be 24 hours as already noted 2. End-stage renal failure on hemodialysis will be continued 3. Acute on chronic systolic heart failure compensated 4. Chronic atrial fibrillation rate is controlled 5. Acute pulmonary embolism no changes at this time we will continue with supportive care   I have personally seen and evaluated the patient, evaluated laboratory and imaging results, formulated the assessment and plan and placed orders. The Patient requires high complexity decision making with multiple systems involvement.  Rounds were done with the Respiratory Therapy Director and Staff therapists and discussed with nursing staff also.  Allyne Gee, MD [facility] Pulmonary Critical Care Medicine Sleep Medicine

## 2020-02-01 DIAGNOSIS — Z9911 Dependence on respirator [ventilator] status: Secondary | ICD-10-CM | POA: Diagnosis not present

## 2020-02-01 DIAGNOSIS — N186 End stage renal disease: Secondary | ICD-10-CM | POA: Diagnosis not present

## 2020-02-01 DIAGNOSIS — J9690 Respiratory failure, unspecified, unspecified whether with hypoxia or hypercapnia: Secondary | ICD-10-CM | POA: Diagnosis not present

## 2020-02-01 DIAGNOSIS — I5043 Acute on chronic combined systolic (congestive) and diastolic (congestive) heart failure: Secondary | ICD-10-CM | POA: Diagnosis not present

## 2020-02-01 DIAGNOSIS — I5040 Unspecified combined systolic (congestive) and diastolic (congestive) heart failure: Secondary | ICD-10-CM | POA: Diagnosis not present

## 2020-02-01 DIAGNOSIS — I959 Hypotension, unspecified: Secondary | ICD-10-CM | POA: Diagnosis not present

## 2020-02-01 DIAGNOSIS — I2699 Other pulmonary embolism without acute cor pulmonale: Secondary | ICD-10-CM | POA: Diagnosis not present

## 2020-02-01 DIAGNOSIS — N2581 Secondary hyperparathyroidism of renal origin: Secondary | ICD-10-CM | POA: Diagnosis not present

## 2020-02-01 DIAGNOSIS — J9621 Acute and chronic respiratory failure with hypoxia: Secondary | ICD-10-CM | POA: Diagnosis not present

## 2020-02-01 DIAGNOSIS — Z992 Dependence on renal dialysis: Secondary | ICD-10-CM | POA: Diagnosis not present

## 2020-02-01 DIAGNOSIS — I482 Chronic atrial fibrillation, unspecified: Secondary | ICD-10-CM | POA: Diagnosis not present

## 2020-02-01 DIAGNOSIS — D631 Anemia in chronic kidney disease: Secondary | ICD-10-CM | POA: Diagnosis not present

## 2020-02-01 LAB — CBC
HCT: 39.2 % (ref 39.0–52.0)
Hemoglobin: 12.1 g/dL — ABNORMAL LOW (ref 13.0–17.0)
MCH: 27.7 pg (ref 26.0–34.0)
MCHC: 30.9 g/dL (ref 30.0–36.0)
MCV: 89.7 fL (ref 80.0–100.0)
Platelets: 128 10*3/uL — ABNORMAL LOW (ref 150–400)
RBC: 4.37 MIL/uL (ref 4.22–5.81)
RDW: 20.7 % — ABNORMAL HIGH (ref 11.5–15.5)
WBC: 9.7 10*3/uL (ref 4.0–10.5)
nRBC: 0.9 % — ABNORMAL HIGH (ref 0.0–0.2)

## 2020-02-01 LAB — RENAL FUNCTION PANEL
Albumin: 3.7 g/dL (ref 3.5–5.0)
Anion gap: 16 — ABNORMAL HIGH (ref 5–15)
BUN: 78 mg/dL — ABNORMAL HIGH (ref 6–20)
CO2: 25 mmol/L (ref 22–32)
Calcium: 9 mg/dL (ref 8.9–10.3)
Chloride: 91 mmol/L — ABNORMAL LOW (ref 98–111)
Creatinine, Ser: 5.94 mg/dL — ABNORMAL HIGH (ref 0.61–1.24)
GFR calc Af Amer: 12 mL/min — ABNORMAL LOW (ref >60)
GFR calc non Af Amer: 10 mL/min — ABNORMAL LOW (ref >60)
Glucose, Bld: 112 mg/dL — ABNORMAL HIGH (ref 70–99)
Phosphorus: 2.9 mg/dL (ref 2.5–4.6)
Potassium: 3.4 mmol/L — ABNORMAL LOW (ref 3.5–5.1)
Sodium: 132 mmol/L — ABNORMAL LOW (ref 135–145)

## 2020-02-01 NOTE — Progress Notes (Signed)
[facility]  ROUNDING NOTE   Subjective:  Patient seen and evaluated at bedside. Due for dialysis today. Tracheostomy capped.  Objective:  Vital signs in last 24 hours:  Temperature 95.8 pulse 87 respirations 24 blood pressure 115/84  Physical Exam: General: Chronically ill-appearing  Head: Normocephalic, atraumatic. Moist oral mucosal membranes  Eyes: Anicteric  Neck: Tracheostomy present, capped  Lungs:  Scattered rhonchi, vent assisted  Heart: S1S2 no rubs irregular  Abdomen:  Soft, nontender, bowel sounds present, distended  Extremities: 1+ peripheral edema.  Neurologic: Awake, alert, following commands  Skin: Warm/dry, normal skin turgor  Access: LUE AV access    Basic Metabolic Panel: Recent Labs  Lab 01/27/20 1336 01/28/20 0707 01/30/20 0810  NA 137  --  135  K 4.0  --  3.7  CL 93*  --  90*  CO2 23  --  21*  GLUCOSE 117*  --  100*  BUN 106*  --  115*  CREATININE 6.31*  --  6.57*  CALCIUM 9.3  --  9.2  MG 2.2 2.0  --   PHOS 4.3  --  4.5    Liver Function Tests: Recent Labs  Lab 01/27/20 1336 01/30/20 0810  ALBUMIN 3.9 3.9   No results for input(s): LIPASE, AMYLASE in the last 168 hours. No results for input(s): AMMONIA in the last 168 hours.  CBC: Recent Labs  Lab 01/28/20 0707 01/30/20 0810  WBC 11.6* 10.0  HGB 12.6* 12.5*  HCT 41.9 40.0  MCV 92.5 87.5  PLT 102* 142*    Cardiac Enzymes: No results for input(s): CKTOTAL, CKMB, CKMBINDEX, TROPONINI in the last 168 hours.  BNP: Invalid input(s): POCBNP  CBG: No results for input(s): GLUCAP in the last 168 hours.  Microbiology: Results for orders placed or performed during the hospital encounter of 11/16/19  SARS Coronavirus 2 by RT PCR (hospital order, performed in [facility] hospital lab) Nasopharyngeal Nasopharyngeal Swab     Status: None   Collection Time: 11/16/19 11:41 AM   Specimen: Nasopharyngeal Swab  Result Value Ref Range Status   SARS Coronavirus 2 NEGATIVE  NEGATIVE Final    Comment: (NOTE) SARS-CoV-2 target nucleic acids are NOT DETECTED. The SARS-CoV-2 RNA is generally detectable in upper and lower respiratory specimens during the acute phase of infection. The lowest concentration of SARS-CoV-2 viral copies this assay can detect is 250 copies / mL. A negative result does not preclude SARS-CoV-2 infection and should not be used as the sole basis for treatment or other patient management decisions.  A negative result may occur with improper specimen collection / handling, submission of specimen other than nasopharyngeal swab, presence of viral mutation(s) within the areas targeted by this assay, and inadequate number of viral copies (<250 copies / mL). A negative result must be combined with clinical observations, patient history, and epidemiological information. Fact Sheet for Patients:   StrictlyIdeas.no Fact Sheet for Healthcare Providers: BankingDealers.co.za This test is not yet approved or cleared  by the [place] FDA and has been authorized for detection and/or diagnosis of SARS-CoV-2 by FDA under an Emergency Use Authorization (EUA).  This EUA will remain in effect (meaning this test can be used) for the duration of the COVID-19 declaration under Section 564(b)(1) of the Act, 21 U.S.C. section 360bbb-3(b)(1), unless the authorization is terminated or revoked sooner. Performed at [facility], [place] [address] [place], [place] [address]   Culture, blood (routine x 2)     Status: None   Collection  Time: 11/16/19  6:03 PM   Specimen: BLOOD RIGHT HAND  Result Value Ref Range Status   Specimen Description BLOOD RIGHT HAND  Final   Special Requests   Final    BOTTLES DRAWN AEROBIC AND ANAEROBIC Blood Culture results may not be optimal due to an inadequate volume of blood received in culture bottles   Culture   Final    NO GROWTH 5 DAYS Performed at [facility],  [place] [address], [place], [place] [address]    Report Status 11/21/2019 FINAL  Final  MRSA PCR Screening     Status: None   Collection Time: 11/16/19  6:05 PM   Specimen: Nasal Mucosa; Nasopharyngeal  Result Value Ref Range Status   MRSA by PCR NEGATIVE NEGATIVE Final    Comment:        The GeneXpert MRSA Assay (FDA approved for NASAL specimens only), is one component of a comprehensive MRSA colonization surveillance program. It is not intended to diagnose MRSA infection nor to guide or monitor treatment for MRSA infections. Performed at [facility], [place] [address] [place], [place] [address]   Culture, blood (routine x 2)     Status: None   Collection Time: 11/16/19  6:08 PM   Specimen: BLOOD RIGHT HAND  Result Value Ref Range Status   Specimen Description BLOOD RIGHT HAND  Final   Special Requests   Final    BOTTLES DRAWN AEROBIC ONLY Blood Culture results may not be optimal due to an inadequate volume of blood received in culture bottles   Culture   Final    NO GROWTH 5 DAYS Performed at [facility], [place] [address] [place], [place] [address]    Report Status 11/21/2019 FINAL  Final  Culture, respiratory (non-expectorated)     Status: None   Collection Time: 11/18/19 12:28 PM   Specimen: Tracheal Aspirate; Respiratory  Result Value Ref Range Status   Specimen Description TRACHEAL ASPIRATE  Final   Special Requests NONE  Final   Gram Stain   Final    ABUNDANT WBC PRESENT,BOTH PMN AND MONONUCLEAR ABUNDANT GRAM POSITIVE COCCI ABUNDANT GRAM NEGATIVE RODS FEW GRAM VARIABLE ROD    Culture   Final    ABUNDANT HAEMOPHILUS INFLUENZAE BETA LACTAMASE POSITIVE Performed at [facility], [place] [address] [place], [place] [address]    Report Status 11/20/2019 FINAL  Final  Surgical pcr screen     Status: Abnormal   Collection Time: 11/24/19  7:53 AM   Specimen: Nasal Mucosa; Nasal Swab  Result Value Ref Range Status   MRSA, PCR NEGATIVE NEGATIVE Final    Staphylococcus aureus POSITIVE (A) NEGATIVE Final    Comment: (NOTE) The Xpert SA Assay (FDA approved for NASAL specimens in patients 12 years of age and older), is one component of a comprehensive surveillance program. It is not intended to diagnose infection nor to guide or monitor treatment. Performed at [facility], [place] [address], [place], [place] [address]   Culture, blood (routine x 2)     Status: None   Collection Time: 11/25/19  3:30 PM   Specimen: BLOOD  Result Value Ref Range Status   Specimen Description BLOOD RIGHT ANTECUBITAL  Final   Special Requests   Final    BOTTLES DRAWN AEROBIC ONLY Blood Culture results may not be optimal due to an inadequate volume of blood received in culture bottles   Culture   Final    NO GROWTH 5  DAYS Performed at [facility], [place] [address], [place], [place] [address]    Report Status 11/30/2019 FINAL  Final  Culture, blood (routine x 2)     Status: None   Collection Time: 11/25/19  3:30 PM   Specimen: BLOOD RIGHT HAND  Result Value Ref Range Status   Specimen Description BLOOD RIGHT HAND  Final   Special Requests   Final    BOTTLES DRAWN AEROBIC AND ANAEROBIC Blood Culture results may not be optimal due to an inadequate volume of blood received in culture bottles   Culture   Final    NO GROWTH 5 DAYS Performed at [facility], [place] [address] [place], [place] [address]    Report Status 11/30/2019 FINAL  Final  Culture, respiratory (non-expectorated)     Status: None   Collection Time: 11/25/19  4:35 PM   Specimen: Tracheal Aspirate; Respiratory  Result Value Ref Range Status   Specimen Description TRACHEAL ASPIRATE  Final   Special Requests NONE  Final   Gram Stain   Final    ABUNDANT WBC PRESENT,BOTH PMN AND MONONUCLEAR FEW GRAM POSITIVE COCCI Performed at [facility], [place] [address] [place], [place] [address]    Culture FEW STAPHYLOCOCCUS EPIDERMIDIS  Final   Report Status 11/28/2019 FINAL  Final    Organism ID, Bacteria STAPHYLOCOCCUS EPIDERMIDIS  Final      Susceptibility   Staphylococcus epidermidis - MIC*    CIPROFLOXACIN <=0.5 SENSITIVE Sensitive     ERYTHROMYCIN >=8 RESISTANT Resistant     GENTAMICIN <=0.5 SENSITIVE Sensitive     OXACILLIN >=4 RESISTANT Resistant     TETRACYCLINE <=1 SENSITIVE Sensitive     VANCOMYCIN 2 SENSITIVE Sensitive     TRIMETH/SULFA <=10 SENSITIVE Sensitive     CLINDAMYCIN >=8 RESISTANT Resistant     RIFAMPIN <=0.5 SENSITIVE Sensitive     Inducible Clindamycin NEGATIVE Sensitive     * FEW STAPHYLOCOCCUS EPIDERMIDIS  Culture, respiratory (non-expectorated)     Status: None   Collection Time: 12/15/19  4:12 PM   Specimen: Tracheal Aspirate; Respiratory  Result Value Ref Range Status   Specimen Description TRACHEAL ASPIRATE  Final   Special Requests NONE  Final   Gram Stain   Final    MODERATE WBC PRESENT, PREDOMINANTLY PMN MODERATE GRAM POSITIVE COCCI IN PAIRS IN CLUSTERS Performed at [facility], [address], [place], [place] [address]    Culture MODERATE STAPHYLOCOCCUS AUREUS  Final   Report Status 12/17/2019 FINAL  Final   Organism ID, Bacteria STAPHYLOCOCCUS AUREUS  Final      Susceptibility   Staphylococcus aureus - MIC*    CIPROFLOXACIN <=0.5 SENSITIVE Sensitive     ERYTHROMYCIN <=0.25 SENSITIVE Sensitive     GENTAMICIN <=0.5 SENSITIVE Sensitive     OXACILLIN <=0.25 SENSITIVE Sensitive     TETRACYCLINE <=1 SENSITIVE Sensitive     VANCOMYCIN 1 SENSITIVE Sensitive     TRIMETH/SULFA <=10 SENSITIVE Sensitive     CLINDAMYCIN <=0.25 SENSITIVE Sensitive     RIFAMPIN <=0.5 SENSITIVE Sensitive     Inducible Clindamycin NEGATIVE Sensitive     * MODERATE STAPHYLOCOCCUS AUREUS  MRSA PCR Screening     Status: None   Collection Time: 12/17/19 10:25 AM   Specimen: Nasopharyngeal  Result Value Ref Range Status   MRSA by PCR NEGATIVE NEGATIVE Final    Comment:        The GeneXpert MRSA Assay (FDA approved for NASAL specimens only),  is  one component of a comprehensive MRSA colonization surveillance program. It is not intended to diagnose MRSA infection nor to guide or monitor treatment for MRSA infections. Performed at [facility], [place] [address], [place], [place] [address]   Culture, blood (routine x 2)     Status: None   Collection Time: 12/17/19  3:15 PM   Specimen: BLOOD  Result Value Ref Range Status   Specimen Description BLOOD RIGHT ANTECUBITAL  Final   Special Requests   Final    BOTTLES DRAWN AEROBIC AND ANAEROBIC Blood Culture results may not be optimal due to an inadequate volume of blood received in culture bottles   Culture   Final    NO GROWTH 5 DAYS Performed at [facility], [place] [address] [place], [place] [address]    Report Status 12/22/2019 FINAL  Final  Culture, blood (routine x 2)     Status: None   Collection Time: 12/17/19  3:20 PM   Specimen: BLOOD RIGHT HAND  Result Value Ref Range Status   Specimen Description BLOOD RIGHT HAND  Final   Special Requests   Final    BOTTLES DRAWN AEROBIC ONLY Blood Culture results may not be optimal due to an inadequate volume of blood received in culture bottles   Culture   Final    NO GROWTH 5 DAYS Performed at [facility], [place] [address], [place], [place] [address]    Report Status 12/22/2019 FINAL  Final    Coagulation Studies: No results for input(s): LABPROT, INR in the last 72 hours.  Urinalysis: No results for input(s): COLORURINE, LABSPEC, PHURINE, GLUCOSEU, HGBUR, BILIRUBINUR, KETONESUR, PROTEINUR, UROBILINOGEN, NITRITE, LEUKOCYTESUR in the last 72 hours.  Invalid input(s): APPERANCEUR    Imaging: No results found.   Medications:       Assessment/ Plan:  54 y.o. male with a PMHx of recent cardiac arrest status post hypothermia protocol, ESRD on HD MWF, anemia of chronic kidney disease, secondary hyperparathyroidism, history of polysubstance abuse, hypertension,, chronic systolic heart failure, medical  nonadherence who was admitted to [facility] on 12/24/2019 for ongoing management of acute respiratory failure.  1.  ESRD on HD MWF.  Patient due for hemodialysis today.  Orders have been prepared.  Ultrafiltration target 1.5 kg.  2.  Anemia of chronic kidney disease.   Lab Results  Component Value Date   HGB 12.5 (L) 01/30/2020  Patient off of Retacrit.   3.  Secondary hyperparathyroidism.   Lab Results  Component Value Date   CALCIUM 9.2 01/30/2020   PHOS 4.5 01/30/2020  Phosphorus at target of 4.5.  Continue to monitor.  4.  Hyperkalemia.  Resolved.  5.  Hypotension.  Continue albumin with dialysis treatments.    LOS: 0 [name] [name] 2/17/20218:11 AM

## 2020-02-01 NOTE — Progress Notes (Signed)
[facility] [place]   PULMONARY CRITICAL CARE SERVICE  PROGRESS NOTE  Date of Service: 02/01/2020  [name] [name]  RWE:[id number]  DOB: 04/19/1966   DOA: 12/24/2019  Referring Physician: Merton Border, MD  HPI: [name] [name] is a 54 y.o. male seen for follow up of Acute on Chronic Respiratory Failure.  Patient is capping with a goal of 48 hours has been capping doing well for about 1 L  Medications: Reviewed on Rounds  Physical Exam:  Vitals: Temperature is 96.7 pulse 87 respiratory rate is 24 blood pressure is 115/84 saturations 100%  Ventilator Settings capping on 1 L O2  . General: Comfortable at this time . Eyes: Grossly normal lids, irises & conjunctiva . ENT: grossly tongue is normal . Neck: no obvious mass . Cardiovascular: S1 S2 normal no gallop . Respiratory: No rhonchi no rales are noted at this time . Abdomen: soft . Skin: no rash seen on limited exam . Musculoskeletal: not rigid . Psychiatric:unable to assess . Neurologic: no seizure no involuntary movements         Lab Data:   Basic Metabolic Panel: Recent Labs  Lab 01/27/20 1336 01/28/20 0707 01/30/20 0810  NA 137  --  135  K 4.0  --  3.7  CL 93*  --  90*  CO2 23  --  21*  GLUCOSE 117*  --  100*  BUN 106*  --  115*  CREATININE 6.31*  --  6.57*  CALCIUM 9.3  --  9.2  MG 2.2 2.0  --   PHOS 4.3  --  4.5    ABG: Recent Labs  Lab 01/26/20 1123  PHART 7.373  PCO2ART 48.0  PO2ART 73.7*  HCO3 27.4  O2SAT 94.5    Liver Function Tests: Recent Labs  Lab 01/27/20 1336 01/30/20 0810  ALBUMIN 3.9 3.9   No results for input(s): LIPASE, AMYLASE in the last 168 hours. No results for input(s): AMMONIA in the last 168 hours.  CBC: Recent Labs  Lab 01/28/20 0707 01/30/20 0810  WBC 11.6* 10.0  HGB 12.6* 12.5*  HCT 41.9 40.0  MCV 92.5 87.5  PLT 102* 142*    Cardiac Enzymes: No results for input(s): CKTOTAL, CKMB, CKMBINDEX, TROPONINI in the last  168 hours.  BNP (last 3 results) Recent Labs    11/16/19 1356  BNP 962.8*    ProBNP (last 3 results) No results for input(s): PROBNP in the last 8760 hours.  Radiological Exams: No results found.  Assessment/Plan Active Problems:   Acute on chronic respiratory failure with hypoxia (HCC)   End stage renal disease on dialysis (HCC)   Acute on chronic systolic and diastolic heart failure, NYHA class 4 (HCC)   Chronic atrial fibrillation (HCC)   Acute pulmonary embolism without acute cor pulmonale ([place])   1. Acute on chronic respiratory failure with hypoxia plan is to continue with capping trials as ordered titrate oxygen continue pulmonary toilet 2. End-stage renal disease on hemodialysis we will continue to follow 3. Acute on chronic systolic heart failure appears to be compensated 4. Chronic atrial fibrillation rate controlled 5. Acute pulmonary embolism no change we will continue with supportive care   I have personally seen and evaluated the patient, evaluated laboratory and imaging results, formulated the assessment and plan and placed orders. The Patient requires high complexity decision making with multiple systems involvement.  Rounds were done with the Respiratory Therapy Director and Staff therapists and discussed with nursing staff also.  Saadat  Richardson Dopp, MD [facility] Pulmonary Critical Care Medicine Sleep Medicine

## 2020-02-02 DIAGNOSIS — I482 Chronic atrial fibrillation, unspecified: Secondary | ICD-10-CM | POA: Diagnosis not present

## 2020-02-02 DIAGNOSIS — I313 Pericardial effusion (noninflammatory): Secondary | ICD-10-CM | POA: Diagnosis not present

## 2020-02-02 DIAGNOSIS — J9621 Acute and chronic respiratory failure with hypoxia: Secondary | ICD-10-CM | POA: Diagnosis not present

## 2020-02-02 DIAGNOSIS — Z992 Dependence on renal dialysis: Secondary | ICD-10-CM | POA: Diagnosis not present

## 2020-02-02 DIAGNOSIS — I5040 Unspecified combined systolic (congestive) and diastolic (congestive) heart failure: Secondary | ICD-10-CM | POA: Diagnosis not present

## 2020-02-02 DIAGNOSIS — J9690 Respiratory failure, unspecified, unspecified whether with hypoxia or hypercapnia: Secondary | ICD-10-CM | POA: Diagnosis not present

## 2020-02-02 DIAGNOSIS — I2699 Other pulmonary embolism without acute cor pulmonale: Secondary | ICD-10-CM | POA: Diagnosis not present

## 2020-02-02 DIAGNOSIS — I5043 Acute on chronic combined systolic (congestive) and diastolic (congestive) heart failure: Secondary | ICD-10-CM | POA: Diagnosis not present

## 2020-02-02 DIAGNOSIS — Z9911 Dependence on respirator [ventilator] status: Secondary | ICD-10-CM | POA: Diagnosis not present

## 2020-02-02 DIAGNOSIS — I4821 Permanent atrial fibrillation: Secondary | ICD-10-CM | POA: Diagnosis not present

## 2020-02-02 DIAGNOSIS — J9601 Acute respiratory failure with hypoxia: Secondary | ICD-10-CM | POA: Diagnosis not present

## 2020-02-02 DIAGNOSIS — N186 End stage renal disease: Secondary | ICD-10-CM | POA: Diagnosis not present

## 2020-02-02 NOTE — PMR Pre-admission (Addendum)
PMR Admission Coordinator Pre-Admission Assessment  Patient: [name] [name] is an 54 y.o., male MRN: [id number] DOB: 1966-03-18 Height:   Weight:    Insurance Information HMO:     PPO:      PCP:      IPA:      80/20:      OTHER:  PRIMARY: Medicare a and b      Policy#: [id number]      Subscriber: pt Benefits:  Phone #: passport one online     Name: 2/19 Eff. Date: 10/15/2012     Deduct: $1484      Out of Pocket Max: none      Life Max: none CIR: 100%      SNF: 20 full days Outpatient: 80%     Co-Pay: 20% Home Health: 100%      Co-Pay: none DME: 80%     Co-Pay: 20% Providers: pt choice  SECONDARY: Medicaid [place] access      Policy#: [id number]      Subscriber: pt Benefits:  Phone #: passport one online     Name: 2/19 Eff. Date: active    MADQN  The "Data Collection Information Summary" for patients in Inpatient Rehabilitation Facilities with attached "Privacy Act [place] Records" was provided and verbally reviewed with: Patient and Family  Emergency Contact Information Contact Information    Name Relation Home Work [place], [name] Father   [phone]   [place], Tangen   [phone]   Mikiah, Demond Daughter   [phone]   [place] Mother   [phone]      Current Medical History  Patient Admitting Diagnosis: Debility post VDRF after cardiac arrest  History of Present Illness:  [name] [name] is a 54 year old male with history of ESRD--HD MWF, CHF, CAF, anemia of chronic disease, NICM, polysubstance abuse; who was originally admitted from his HD center on 11/16/2019 with PEA arrest 3 hours into his HD session. He received CPR with ACLS protocol and had ROSC after 15 minutes. Emergent cricothyrotomy performed by EMS due to failed intubation. He was treated with cooling protocol and 2 D echo revealed EF 15-20% with septal flattening c/w RV pressure and volume overload and also found to be in A fib with tracheal aspirate positive for H Flu.  CT  chest showed possible subsegmental PE on right and left as well as large left pleural effusion.Marland Kitchen  He required pressors with CRRT, was started on IV heparin and has been treated with multiple rounds of antibiotics.  He had recurrent VF arrest on 02/08 treated with lidocaine and amiodarone gtt.   Dr. Allred/Cards recommended supportive care--patient not a candidate for advanced CHF therapies or ICD due to history of substance abuse as well as poor overall prognosis and no plans for ischemic work up. Cricothyrotomy converted to tracheostomy 12/10 by Dr. Constance Holster on  Hospital course significant for issues with volume overload with hypotension and VDRF. He was discharged to [facility] on 12/23/2019 for vent wean and medical management.   Pericardial effusion monitored with repeat echo showing moderate size effusion without tamponade and he was treated with a course of colchicine. Gastrostomy tube placed by Dr. Annamaria Boots on 01/25.  He developed neck edema on 01/27 and CT neck showed circumferential mucosal thickening involving oropharynx, supraglottic larynx likely due to acute pharyngitis and with supraglottic airway narrowed to 2 mm at narrowest point also incidental findings of large layering bilateral pleural effusions R>L partially visualized. He was treated with course of Augmentin and steroids.  He also underwent thoracocentesis of 250 cc ambler fluid from right lung on 1/28.  He continues to be NPO with tube feeds ongoing but has had difficulty tolerating this due to persistent abdominal distension as well as high residuals. Abdominal ultrasound showed minimal ascites and multiple KUB's have been negative for ileus. His tube feed slowly increased to 40 cc/hr by 02/16 and he is tolerating this without discomfort. He was weaned to [facility] 02/09, capping trials were started on 02/15 and he was de cannulated without difficulty on 02/17. Anxiety issues with reports of panic disorder managed with scheduled Klonopin and ativan prn.  Seroquel discontinued due to concern of EPS and on cogentin .    Patient's medical record from [facility]  has been reviewed by the rehabilitation admission coordinator and physician.  Past Medical History  Past Medical History:  Diagnosis Date  . Abdominal distension 08/10/2018  . Acute on chronic respiratory failure with hypoxia ([place])   . Acute on chronic systolic and diastolic heart failure, NYHA class 4 ([place])   . Acute pulmonary embolism without acute cor pulmonale (HCC)   . Alcohol abuse 11/26/2010   Qualifier: Diagnosis of  By: [name] MD, Pershing Proud  . Anemia due to blood loss, acute 07/15/2012  . CHF (congestive heart failure) ([place])   . Cholecystitis 04/18/2014  . Chronic atrial fibrillation ([place])   . Cigarette smoker    has currently quit  . Cocaine abuse ([place]) 11/26/2010   Qualifier: Diagnosis of  By: [name] MD, Benjamine Mola  has been over a year  . Combined congestive systolic and diastolic heart failure ([place]) 04/18/2014   A. Echo 8/13: Severe LVH, EF 40-45%, inferoposterior akinesis, grade 2 diastolic dysfunction, moderate LAE, mild RVE, mildly reduced RVSF, mild RAE; cannot rule out R atrial mass-suggest TEE or cardiac MRI  //  B. Echo 3/17: Mild LVH, EF 25-30%, diffuse HK, grade 2 diastolic dysfunction, mild MR, severe LAE,  moderately reduced RVSF, severe RAE, mild TR, moderate PI, PASP 55 mmHg    . Dyspnea   . End stage renal disease on dialysis ([place])   . ESRD (end stage renal disease) on dialysis [facility])    MWF- [place] (05/18/2017)  . Hemodialysis patient ([place])    M,W,F  . Hernia, inguinal, right 08/05/2016  . Hypertension   . 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 Hypocalcemia 07/15/2012  . Medical non-compliance   . Non-ischemic cardiomyopathy ([place])    A. R/L HC 3/17: Normal coronary arteries, moderate pulmonary  hypertension (PASP 65 mmHg), elevated LV filling pressures (LVEDP 45 mmHg)   . NSVT (nonsustained ventricular tachycardia) ([place]) 02/22/2016  . NSVT (nonsustained ventricular tachycardia) ([place]) 02/22/2016  . Pneumonia 11/12/2017  . Polysubstance abuse ([place])   . Prolonged Q-T interval on ECG 05/18/2017  . Prolonged QT interval 05/18/2017  . Restless leg syndrome   . Solitary pulmonary nodule 10/10/2015   See cxr 10/09/2015 - CT rec 10/10/2015 >>>   . Upper airway cough syndrome 10/09/2015   Off ACEi around 1st Oct 2016  - Sinus CT 10/10/2015 >>>      Family History   family history includes Diabetes in his mother; Hypertension in his father and mother; Renal Disease in his mother.  Prior Rehab/Hospitalizations Has the patient had prior rehab or hospitalizations prior to admission? Yes  Has the patient had major surgery during 100 days  prior to admission? Yes   Current Medications See MAR  Patients Current Diet:  Diet Order            Diet NPO time specified  Diet effective midnight             NPO except ice chips. MBS pending on admit 02/03/2020  Precautions / Restrictions   Fall precautions  Has the patient had 2 or more falls or a fall with injury in the past year? No  Prior Activity Level  Independent with mobility and ADLS pta; did not drive. Disabled Outpatient Hemodialysis MWF [facility]. Dad, [name] transported to outpatient dialysis  Prior Functional Level Self Care: Did the patient need help bathing, dressing, using the toilet or eating? Independent  Indoor Mobility: Did the patient need assistance with walking from room to room (with or without device)? Independent  Stairs: Did the patient need assistance with internal or external stairs (with or without device)? Independent  Functional Cognition: Did the patient need help planning regular tasks such as shopping or remembering to take medications? Independent  Home Assistive Devices / Equipment  None  Prior  Device Use: Indicate devices/aids used by the patient prior to current illness, exacerbation or injury? None of the above   Prior Functional Level Current Functional Level  Bed Mobility   Independent Min guard to edge of bed  Transfers   Independent   Min guard   Mobility - Walk/Wheelchair   Independent   Min guard assist with RW; min to mod assist without AD   Upper Body Dressing   Independent   Min assist   Lower Body Dressing   Independent   Mod assist   Grooming   Independent   supervision   Eating/Drinking    Independent  NPO; ice chips only; supervision   Toilet Transfer   Independent   Min guard to min   Bladder Continence    continent   continent   Bowel Management   Continent   continent   Stair Climbing   Independent   not attempted   Communication   Independent   supervision. De cannulated 2/18   Memory   Intact  decreased safety awareness and not aware of deficits   Driving    did not drive    Special needs/care consideration BiPAP/CPAP  CPM  Continuous Drip IV  Dialysis MWF [facility] center pta; Mom Peter Congo is at [facility]; I contacted Garth Bigness, renal navigator to request per family to have both patient and his Mom set up for [facility] dialysis center. Pt's Dad, [name] provides transportation to OP dialysis for both Life Vest  Oxygen room air Special Bed  Trach Size decannulation 02/02/2020 Wound Vac  Skin right toes #1 and #2 with ischemic changes; left foot and toes with ischemic changes ; WOC at [facility] following Has PEG and has been NPO to date. MBS penidng on admission. Bowel mgmt: continent Bladder mgmt:  continent Diabetic mgmt:  Behavioral consideration anxiety issues with reports of panic disorder Chemo/radiation  Designated visitor is his [place]. Has a son and daughter who are involved only by phone. They do not assist with his physical care pta for he did not need any assist besides transportation to  OP dialysis   Previous Home Environment   One level home with 2 step entry with rails. Tub shower combo. He lives with his parents, [name] and Peter Congo. Mom is also on OP dialysis. [name], Dad provides  transportation to dialysis. He has son and daughter but they do not assist in his care.  Discharge Living Setting  To return home to live with his parents as before admit  Social/Family/Support Systems  Lives with his parents [name] an Peter Congo. Peter Congo also on OP dialysis. Patient did not drive, but walked a lot pta. Son and daughter not involved in his actual day to day care pta.  Goals/Additional Needs  Mod I to supervision with PT, OT, and SLP ELOS 7 to 14 days  Decrease burden of Care through IP rehab admission:   Possible need for SNF placement upon discharge:   Patient Condition: I have reviewed medical records from [facility] , spoken with CSW, and patient, daughter and family member Dad and Mom. I met with patient at the bedside for inpatient rehabilitation assessment.  Patient will benefit from ongoing PT, OT and SLP, can actively participate in 3 hours of therapy a day 5 days of the week, and can make measurable gains during the admission.  Patient will also benefit from the coordinated team approach during an Inpatient Acute Rehabilitation admission.  The patient will receive intensive therapy as well as Rehabilitation physician, nursing, social worker, and care management interventions.  Due to bladder management, bowel management, safety, skin/wound care, disease management, medication administration, pain management and patient education the patient requires 24 hour a day rehabilitation nursing.  The patient is currently min guard  Mobility and to min to mod assist with adls .  Discharge setting and therapy post discharge at home with home health is anticipated.  Patient has agreed to participate in the Acute Inpatient Rehabilitation Program and will admit today.  Preadmission  Screen Completed By:  Cleatrice Burke, 02/02/2020 9:16 PM ______________________________________________________________________   Discussed status with Dr. Posey Pronto  on  02/03/2020 at 1135 and received approval for admission today.  Admission Coordinator:  Cleatrice Burke, RN, time  2025 Date  02/03/2020   Assessment/Plan: Diagnosis: Encephalopathy 1. Does the need for close, 24 hr/day Medical supervision in concert with the patient's rehab needs make it unreasonable for this patient to be served in a less intensive setting? Yes 2. Co-Morbidities requiring supervision/potential complications: ESRD--HD MWF, CHF, CAF, anemia of chronic disease, NICM, polysubstance abuse, dysphagia s/p PEG 3. Due to safety, skin/wound care, disease management, medication administration, pain management and patient education, does the patient require 24 hr/day rehab nursing? Yes 4. Does the patient require coordinated care of a physician, rehab nurse, PT, OT, and SLP to address physical and functional deficits in the context of the above medical diagnosis(es)? Yes Addressing deficits in the following areas: balance, endurance, locomotion, strength, transferring, bathing, dressing, toileting, swallowing and psychosocial support 5. Can the patient actively participate in an intensive therapy program of at least 3 hrs of therapy 5 days a week? Yes 6. The potential for patient to make measurable gains while on inpatient rehab is excellent 7. Anticipated functional outcomes upon discharge from inpatient rehab: modified independent and supervision PT, modified independent and supervision OT, modified independent SLP 8. Estimated rehab length of stay to reach the above functional goals is: 7-11 days. 9. Anticipated discharge destination: Home 10. Overall Rehab/Functional Prognosis: good  MD Signature Delice Lesch, MD, ABPMR

## 2020-02-02 NOTE — Consult Note (Signed)
Ref: Jean Rosenthal, MD   Subjective:  Frequent non-sustained VT. Mild hypokalemia and hyponatremia on blood work. CBC is stable with chronic mild thrombocytopenia.  He is capping tracheostomy tube and sats 99 % on 1 L oxygen by nasal cannula.  Objective:  Vital Signs in the last 24 hours:  P: (6, R: 23, BP: 113/77.  Physical Exam: BP Readings from Last 1 Encounters:  01/09/20 104/88     Wt Readings from Last 1 Encounters:  12/22/19 68.6 kg    Weight change:  There is no height or weight on file to calculate BMI. HEENT: [place]/AT, Eyes-Brown, PERL, EOMI, Conjunctiva-Pink, Sclera-Non-icteric Neck: No JVD, No bruit, Trachea midline. Lungs:  Clear, Bilateral. Cardiac:  Regular rhythm, normal S1 and S2, no S3. II/VI systolic murmur. Positive rub over precordium in sitting position. Abdomen:  Soft, non-tender. BS present. Extremities:  Trace ankle edema present. No cyanosis. No clubbing. Bil. Feet gangrene. CNS: AxOx3, Cranial nerves grossly intact, moves all 4 extremities.  Skin: Warm and dry.   Intake/Output from previous day: No intake/output data recorded.    Lab Results: BMET    Component Value Date/Time   NA 132 (L) 02/01/2020 1138   NA 135 01/30/2020 0810   NA 137 01/27/2020 1336   K 3.4 (L) 02/01/2020 1138   K 3.7 01/30/2020 0810   K 4.0 01/27/2020 1336   CL 91 (L) 02/01/2020 1138   CL 90 (L) 01/30/2020 0810   CL 93 (L) 01/27/2020 1336   CO2 25 02/01/2020 1138   CO2 21 (L) 01/30/2020 0810   CO2 23 01/27/2020 1336   GLUCOSE 112 (H) 02/01/2020 1138   GLUCOSE 100 (H) 01/30/2020 0810   GLUCOSE 117 (H) 01/27/2020 1336   BUN 78 (H) 02/01/2020 1138   BUN 115 (H) 01/30/2020 0810   BUN 106 (H) 01/27/2020 1336   CREATININE 5.94 (H) 02/01/2020 1138   CREATININE 6.57 (H) 01/30/2020 0810   CREATININE 6.31 (H) 01/27/2020 1336   CALCIUM 9.0 02/01/2020 1138   CALCIUM 9.2 01/30/2020 0810   CALCIUM 9.3 01/27/2020 1336   CALCIUM 8.6 (L) 12/23/2019 0323   CALCIUM 8.2 (L)  10/21/2010 1420   CALCIUM 8.7 06/24/2009 0542   GFRNONAA 10 (L) 02/01/2020 1138   GFRNONAA 9 (L) 01/30/2020 0810   GFRNONAA 9 (L) 01/27/2020 1336   GFRAA 12 (L) 02/01/2020 1138   GFRAA 10 (L) 01/30/2020 0810   GFRAA 11 (L) 01/27/2020 1336   CBC    Component Value Date/Time   WBC 9.7 02/01/2020 1138   RBC 4.37 02/01/2020 1138   HGB 12.1 (L) 02/01/2020 1138   HGB 12.3 (L) 12/31/2017 1012   HCT 39.2 02/01/2020 1138   HCT 37.4 (L) 12/31/2017 1012   PLT 128 (L) 02/01/2020 1138   PLT 176 12/31/2017 1012   MCV 89.7 02/01/2020 1138   MCV 99 (H) 12/31/2017 1012   MCH 27.7 02/01/2020 1138   MCHC 30.9 02/01/2020 1138   RDW 20.7 (H) 02/01/2020 1138   RDW 18.8 (H) 12/31/2017 1012   LYMPHSABS 1.1 12/17/2019 1504   LYMPHSABS 1.0 12/31/2017 1012   MONOABS 1.7 (H) 12/17/2019 1504   EOSABS 0.5 12/17/2019 1504   EOSABS 0.1 12/31/2017 1012   BASOSABS 0.1 12/17/2019 1504   BASOSABS 0.0 12/31/2017 1012   HEPATIC Function Panel Recent Labs    12/17/19 0017 12/19/19 0446 12/24/19 0539  PROT 8.1 7.4 7.6   HEMOGLOBIN A1C No components found for: HGA1C,  MPG CARDIAC ENZYMES Lab Results  Component  Value Date   CKTOTAL 520 (H) 07/16/2012   CKMB 10.7 (HH) 07/16/2012   TROPONINI 0.22 (HH) 11/13/2017   TROPONINI 0.19 (HH) 11/12/2017   TROPONINI 0.26 (HH) 05/19/2017   BNP No results for input(s): PROBNP in the last 8760 hours. TSH Recent Labs    11/16/19 1730 12/25/19 0350  TSH 7.112* 6.933*   CHOLESTEROL No results for input(s): CHOL in the last 8760 hours.  Scheduled Meds: Continuous Infusions: PRN Meds:.lidocaine  Assessment/Plan:  Acute on chronic respiratory failure with hypoxemia Moderate LV systolic dysfunction Mild pericardial effusion NSVT S/P cardiac arrest x 2 H/O PE Bilateral feet gangrene Anemia of chronic disease Chronic atrial fibrillation  Continue medical treatment. Limited echocardiogram tomorrow for pericardial effusion.    LOS: 0 days   Time  spent including chart review, lab review, examination, discussion with patient :  min   Dixie Dials  MD  02/02/2020, 9:17 PM

## 2020-02-02 NOTE — Progress Notes (Signed)
[facility]   PULMONARY CRITICAL CARE SERVICE  PROGRESS NOTE  Date of Service: 02/02/2020  Renald Haithcock  MAU:[id number]  DOB: 1966-01-12   DOA: 12/24/2019  Referring Physician: Merton Border, MD  HPI: [name] [name] is a 54 y.o. male seen for follow up of Acute on Chronic Respiratory Failure.  Patient is capping right now on 1 L oxygen has now been capping for over 72 hours  Medications: Reviewed on Rounds  Physical Exam:  Vitals: Temperature is 98.0 pulse 96 respiratory 23 blood pressure is 113/77 saturations 99%  Ventilator Settings capping on 1 L oxygen  . General: Comfortable at this time . Eyes: Grossly normal lids, irises & conjunctiva . ENT: grossly tongue is normal . Neck: no obvious mass . Cardiovascular: S1 S2 normal no gallop . Respiratory: No rhonchi no rales are noted at this time . Abdomen: soft . Skin: no rash seen on limited exam . Musculoskeletal: not rigid . Psychiatric:unable to assess . Neurologic: no seizure no involuntary movements         Lab Data:   Basic Metabolic Panel: Recent Labs  Lab 01/27/20 1336 01/28/20 0707 01/30/20 0810 02/01/20 1138  NA 137  --  135 132*  K 4.0  --  3.7 3.4*  CL 93*  --  90* 91*  CO2 23  --  21* 25  GLUCOSE 117*  --  100* 112*  BUN 106*  --  115* 78*  CREATININE 6.31*  --  6.57* 5.94*  CALCIUM 9.3  --  9.2 9.0  MG 2.2 2.0  --   --   PHOS 4.3  --  4.5 2.9    ABG: No results for input(s): PHART, PCO2ART, PO2ART, HCO3, O2SAT in the last 168 hours.  Liver Function Tests: Recent Labs  Lab 01/27/20 1336 01/30/20 0810 02/01/20 1138  ALBUMIN 3.9 3.9 3.7   No results for input(s): LIPASE, AMYLASE in the last 168 hours. No results for input(s): AMMONIA in the last 168 hours.  CBC: Recent Labs  Lab 01/28/20 0707 01/30/20 0810 02/01/20 1138  WBC 11.6* 10.0 9.7  HGB 12.6* 12.5* 12.1*  HCT 41.9 40.0 39.2  MCV 92.5 87.5 89.7  PLT 102* 142* 128*     Cardiac Enzymes: No results for input(s): CKTOTAL, CKMB, CKMBINDEX, TROPONINI in the last 168 hours.  BNP (last 3 results) Recent Labs    11/16/19 1356  BNP 962.8*    ProBNP (last 3 results) No results for input(s): PROBNP in the last 8760 hours.  Radiological Exams: No results found.  Assessment/Plan Active Problems:   Acute on chronic respiratory failure with hypoxia (HCC)   End stage renal disease on dialysis (HCC)   Acute on chronic systolic and diastolic heart failure, NYHA class 4 (HCC)   Chronic atrial fibrillation (HCC)   Acute pulmonary embolism without acute cor pulmonale ([place])   1. Acute on chronic respiratory failure with hypoxia plan is to continue with the weaning process and proceed to decannulation 2. End-stage renal disease on hemodialysis 3. Acute on chronic systolic heart failure compensated 4. Chronic atrial fibrillation rate controlled 5. Acute pulmonary embolism treated   I have personally seen and evaluated the patient, evaluated laboratory and imaging results, formulated the assessment and plan and placed orders. The Patient requires high complexity decision making with multiple systems involvement.  Rounds were done with the Respiratory Therapy Director and Staff therapists and discussed with nursing staff also.  Allyne Gee, MD [facility] Pulmonary  Critical Care Medicine Sleep Medicine

## 2020-02-03 ENCOUNTER — Inpatient Hospital Stay (HOSPITAL_COMMUNITY)
Admission: RE | Admit: 2020-02-03 | Discharge: 2020-02-27 | DRG: 945 | Disposition: A | Discharge status: Home Health | Payer: Medicare Other | Source: Intra-hospital | Attending: Physical Medicine & Rehabilitation | Admitting: Physical Medicine & Rehabilitation

## 2020-02-03 ENCOUNTER — Encounter (HOSPITAL_COMMUNITY): Payer: Self-pay | Admitting: Physical Medicine & Rehabilitation

## 2020-02-03 ENCOUNTER — Other Ambulatory Visit (HOSPITAL_COMMUNITY): Payer: Medicare Other

## 2020-02-03 DIAGNOSIS — N2581 Secondary hyperparathyroidism of renal origin: Secondary | ICD-10-CM | POA: Diagnosis present

## 2020-02-03 DIAGNOSIS — Z86711 Personal history of pulmonary embolism: Secondary | ICD-10-CM

## 2020-02-03 DIAGNOSIS — G9341 Metabolic encephalopathy: Secondary | ICD-10-CM | POA: Diagnosis present

## 2020-02-03 DIAGNOSIS — G9340 Encephalopathy, unspecified: Secondary | ICD-10-CM

## 2020-02-03 DIAGNOSIS — N281 Cyst of kidney, acquired: Secondary | ICD-10-CM | POA: Diagnosis not present

## 2020-02-03 DIAGNOSIS — I132 Hypertensive heart and chronic kidney disease with heart failure and with stage 5 chronic kidney disease, or end stage renal disease: Secondary | ICD-10-CM | POA: Diagnosis present

## 2020-02-03 DIAGNOSIS — R0602 Shortness of breath: Secondary | ICD-10-CM

## 2020-02-03 DIAGNOSIS — F411 Generalized anxiety disorder: Secondary | ICD-10-CM

## 2020-02-03 DIAGNOSIS — R400 Somnolence: Secondary | ICD-10-CM | POA: Diagnosis not present

## 2020-02-03 DIAGNOSIS — I120 Hypertensive chronic kidney disease with stage 5 chronic kidney disease or end stage renal disease: Secondary | ICD-10-CM | POA: Diagnosis not present

## 2020-02-03 DIAGNOSIS — F410 Panic disorder [episodic paroxysmal anxiety] without agoraphobia: Secondary | ICD-10-CM | POA: Diagnosis present

## 2020-02-03 DIAGNOSIS — Z992 Dependence on renal dialysis: Secondary | ICD-10-CM | POA: Diagnosis not present

## 2020-02-03 DIAGNOSIS — E8889 Other specified metabolic disorders: Secondary | ICD-10-CM | POA: Diagnosis present

## 2020-02-03 DIAGNOSIS — I2699 Other pulmonary embolism without acute cor pulmonale: Secondary | ICD-10-CM | POA: Diagnosis not present

## 2020-02-03 DIAGNOSIS — Z87891 Personal history of nicotine dependence: Secondary | ICD-10-CM

## 2020-02-03 DIAGNOSIS — E8770 Fluid overload, unspecified: Secondary | ICD-10-CM | POA: Diagnosis not present

## 2020-02-03 DIAGNOSIS — I482 Chronic atrial fibrillation, unspecified: Secondary | ICD-10-CM | POA: Diagnosis not present

## 2020-02-03 DIAGNOSIS — N186 End stage renal disease: Secondary | ICD-10-CM

## 2020-02-03 DIAGNOSIS — I96 Gangrene, not elsewhere classified: Secondary | ICD-10-CM

## 2020-02-03 DIAGNOSIS — J984 Other disorders of lung: Secondary | ICD-10-CM | POA: Diagnosis not present

## 2020-02-03 DIAGNOSIS — J90 Pleural effusion, not elsewhere classified: Secondary | ICD-10-CM | POA: Diagnosis not present

## 2020-02-03 DIAGNOSIS — I959 Hypotension, unspecified: Secondary | ICD-10-CM

## 2020-02-03 DIAGNOSIS — R5383 Other fatigue: Secondary | ICD-10-CM | POA: Diagnosis not present

## 2020-02-03 DIAGNOSIS — I472 Ventricular tachycardia: Secondary | ICD-10-CM | POA: Diagnosis present

## 2020-02-03 DIAGNOSIS — Z79899 Other long term (current) drug therapy: Secondary | ICD-10-CM

## 2020-02-03 DIAGNOSIS — J9611 Chronic respiratory failure with hypoxia: Secondary | ICD-10-CM | POA: Diagnosis present

## 2020-02-03 DIAGNOSIS — R1310 Dysphagia, unspecified: Secondary | ICD-10-CM | POA: Diagnosis present

## 2020-02-03 DIAGNOSIS — R140 Abdominal distension (gaseous): Secondary | ICD-10-CM

## 2020-02-03 DIAGNOSIS — R4189 Other symptoms and signs involving cognitive functions and awareness: Secondary | ICD-10-CM | POA: Diagnosis present

## 2020-02-03 DIAGNOSIS — I672 Cerebral atherosclerosis: Secondary | ICD-10-CM | POA: Diagnosis not present

## 2020-02-03 DIAGNOSIS — G479 Sleep disorder, unspecified: Secondary | ICD-10-CM | POA: Diagnosis not present

## 2020-02-03 DIAGNOSIS — K5900 Constipation, unspecified: Secondary | ICD-10-CM | POA: Diagnosis present

## 2020-02-03 DIAGNOSIS — R0902 Hypoxemia: Secondary | ICD-10-CM

## 2020-02-03 DIAGNOSIS — I5042 Chronic combined systolic (congestive) and diastolic (congestive) heart failure: Secondary | ICD-10-CM | POA: Diagnosis present

## 2020-02-03 DIAGNOSIS — J9621 Acute and chronic respiratory failure with hypoxia: Secondary | ICD-10-CM | POA: Diagnosis not present

## 2020-02-03 DIAGNOSIS — Z931 Gastrostomy status: Secondary | ICD-10-CM

## 2020-02-03 DIAGNOSIS — I313 Pericardial effusion (noninflammatory): Secondary | ICD-10-CM

## 2020-02-03 DIAGNOSIS — I509 Heart failure, unspecified: Secondary | ICD-10-CM | POA: Diagnosis not present

## 2020-02-03 DIAGNOSIS — J918 Pleural effusion in other conditions classified elsewhere: Secondary | ICD-10-CM | POA: Diagnosis present

## 2020-02-03 DIAGNOSIS — I7389 Other specified peripheral vascular diseases: Secondary | ICD-10-CM | POA: Diagnosis present

## 2020-02-03 DIAGNOSIS — I3139 Other pericardial effusion (noninflammatory): Secondary | ICD-10-CM

## 2020-02-03 DIAGNOSIS — N250 Renal osteodystrophy: Secondary | ICD-10-CM | POA: Diagnosis not present

## 2020-02-03 DIAGNOSIS — J9811 Atelectasis: Secondary | ICD-10-CM | POA: Diagnosis present

## 2020-02-03 DIAGNOSIS — J9690 Respiratory failure, unspecified, unspecified whether with hypoxia or hypercapnia: Secondary | ICD-10-CM | POA: Diagnosis not present

## 2020-02-03 DIAGNOSIS — F329 Major depressive disorder, single episode, unspecified: Secondary | ICD-10-CM | POA: Diagnosis present

## 2020-02-03 DIAGNOSIS — I5020 Unspecified systolic (congestive) heart failure: Secondary | ICD-10-CM | POA: Diagnosis not present

## 2020-02-03 DIAGNOSIS — J811 Chronic pulmonary edema: Secondary | ICD-10-CM

## 2020-02-03 DIAGNOSIS — Z841 Family history of disorders of kidney and ureter: Secondary | ICD-10-CM

## 2020-02-03 DIAGNOSIS — Z9911 Dependence on respirator [ventilator] status: Secondary | ICD-10-CM | POA: Diagnosis not present

## 2020-02-03 DIAGNOSIS — R5381 Other malaise: Secondary | ICD-10-CM | POA: Diagnosis present

## 2020-02-03 DIAGNOSIS — I4891 Unspecified atrial fibrillation: Secondary | ICD-10-CM | POA: Diagnosis not present

## 2020-02-03 DIAGNOSIS — I428 Other cardiomyopathies: Secondary | ICD-10-CM | POA: Diagnosis present

## 2020-02-03 DIAGNOSIS — I953 Hypotension of hemodialysis: Secondary | ICD-10-CM | POA: Diagnosis not present

## 2020-02-03 DIAGNOSIS — I4821 Permanent atrial fibrillation: Secondary | ICD-10-CM | POA: Diagnosis present

## 2020-02-03 DIAGNOSIS — Z09 Encounter for follow-up examination after completed treatment for conditions other than malignant neoplasm: Secondary | ICD-10-CM

## 2020-02-03 DIAGNOSIS — D631 Anemia in chronic kidney disease: Secondary | ICD-10-CM | POA: Diagnosis present

## 2020-02-03 DIAGNOSIS — R1312 Dysphagia, oropharyngeal phase: Secondary | ICD-10-CM | POA: Diagnosis not present

## 2020-02-03 DIAGNOSIS — Z7901 Long term (current) use of anticoagulants: Secondary | ICD-10-CM

## 2020-02-03 DIAGNOSIS — I5043 Acute on chronic combined systolic (congestive) and diastolic (congestive) heart failure: Secondary | ICD-10-CM | POA: Diagnosis not present

## 2020-02-03 DIAGNOSIS — R109 Unspecified abdominal pain: Secondary | ICD-10-CM

## 2020-02-03 DIAGNOSIS — E876 Hypokalemia: Secondary | ICD-10-CM | POA: Diagnosis present

## 2020-02-03 DIAGNOSIS — Z794 Long term (current) use of insulin: Secondary | ICD-10-CM

## 2020-02-03 DIAGNOSIS — Z9981 Dependence on supplemental oxygen: Secondary | ICD-10-CM

## 2020-02-03 DIAGNOSIS — Z833 Family history of diabetes mellitus: Secondary | ICD-10-CM

## 2020-02-03 DIAGNOSIS — G4700 Insomnia, unspecified: Secondary | ICD-10-CM | POA: Diagnosis present

## 2020-02-03 DIAGNOSIS — J8410 Pulmonary fibrosis, unspecified: Secondary | ICD-10-CM | POA: Diagnosis not present

## 2020-02-03 DIAGNOSIS — K8020 Calculus of gallbladder without cholecystitis without obstruction: Secondary | ICD-10-CM | POA: Diagnosis not present

## 2020-02-03 DIAGNOSIS — R05 Cough: Secondary | ICD-10-CM | POA: Diagnosis not present

## 2020-02-03 DIAGNOSIS — Z8249 Family history of ischemic heart disease and other diseases of the circulatory system: Secondary | ICD-10-CM

## 2020-02-03 LAB — RENAL FUNCTION PANEL
Albumin: 3.5 g/dL (ref 3.5–5.0)
Anion gap: 18 — ABNORMAL HIGH (ref 5–15)
BUN: 78 mg/dL — ABNORMAL HIGH (ref 6–20)
CO2: 24 mmol/L (ref 22–32)
Calcium: 8.9 mg/dL (ref 8.9–10.3)
Chloride: 93 mmol/L — ABNORMAL LOW (ref 98–111)
Creatinine, Ser: 5.75 mg/dL — ABNORMAL HIGH (ref 0.61–1.24)
GFR calc Af Amer: 12 mL/min — ABNORMAL LOW (ref >60)
GFR calc non Af Amer: 10 mL/min — ABNORMAL LOW (ref >60)
Glucose, Bld: 106 mg/dL — ABNORMAL HIGH (ref 70–99)
Phosphorus: 2.5 mg/dL (ref 2.5–4.6)
Potassium: 4 mmol/L (ref 3.5–5.1)
Sodium: 135 mmol/L (ref 135–145)

## 2020-02-03 LAB — CBC
HCT: 40.3 % (ref 39.0–52.0)
Hemoglobin: 12.3 g/dL — ABNORMAL LOW (ref 13.0–17.0)
MCH: 27.2 pg (ref 26.0–34.0)
MCHC: 30.5 g/dL (ref 30.0–36.0)
MCV: 89.2 fL (ref 80.0–100.0)
Platelets: 114 10*3/uL — ABNORMAL LOW (ref 150–400)
RBC: 4.52 MIL/uL (ref 4.22–5.81)
RDW: 20.9 % — ABNORMAL HIGH (ref 11.5–15.5)
WBC: 8.2 10*3/uL (ref 4.0–10.5)
nRBC: 0.5 % — ABNORMAL HIGH (ref 0.0–0.2)

## 2020-02-03 LAB — NOVEL CORONAVIRUS, NAA (HOSP ORDER, SEND-OUT TO REF LAB; TAT 18-24 HRS): SARS-CoV-2, NAA: NOT DETECTED

## 2020-02-03 MED ORDER — NEPRO/CARBSTEADY PO LIQD
1000.0000 mL | ORAL | Status: DC
  Filled 2020-02-03 (×2): qty 1000

## 2020-02-03 MED ORDER — GUAIFENESIN-DM 100-10 MG/5ML PO SYRP: 5.0000 mL | ORAL_SOLUTION | Freq: Four times a day (QID) | ORAL | Status: DC | PRN

## 2020-02-03 MED ORDER — PREDNISONE 10 MG PO TABS
10.0000 mg | ORAL_TABLET | Freq: Every day | ORAL | Status: DC
  Administered 2020-02-04 – 2020-02-12 (×9): 10 mg
  Filled 2020-02-03 (×9): qty 1

## 2020-02-03 MED ORDER — NAPHAZOLINE-GLYCERIN 0.012-0.2 % OP SOLN: 1.0000 [drp] | Freq: Four times a day (QID) | OPHTHALMIC | Status: DC | PRN

## 2020-02-03 MED ORDER — FOLIC ACID 1 MG PO TABS
1.0000 mg | ORAL_TABLET | Freq: Every day | ORAL | Status: DC
  Administered 2020-02-04 – 2020-02-12 (×9): 1 mg
  Filled 2020-02-03 (×9): qty 1

## 2020-02-03 MED ORDER — COLCHICINE 0.6 MG PO TABS
0.6000 mg | ORAL_TABLET | Freq: Every day | ORAL | Status: DC
  Administered 2020-02-04 – 2020-02-12 (×9): 0.6 mg
  Filled 2020-02-03 (×9): qty 1

## 2020-02-03 MED ORDER — FREE WATER
100.0000 mL | Freq: Two times a day (BID) | Status: DC
  Administered 2020-02-03: 50 mL
  Administered 2020-02-04 – 2020-02-14 (×21): 100 mL
  Administered 2020-02-14: 30 mL
  Administered 2020-02-15 (×2): 100 mL
  Administered 2020-02-16: 30 mL
  Administered 2020-02-16 – 2020-02-27 (×18): 100 mL

## 2020-02-03 MED ORDER — APIXABAN 2.5 MG PO TABS
2.5000 mg | ORAL_TABLET | Freq: Two times a day (BID) | ORAL | Status: DC
  Administered 2020-02-03 – 2020-02-12 (×18): 2.5 mg
  Filled 2020-02-03 (×18): qty 1

## 2020-02-03 MED ORDER — METOCLOPRAMIDE HCL 5 MG PO TABS
5.0000 mg | ORAL_TABLET | Freq: Three times a day (TID) | ORAL | Status: DC
  Administered 2020-02-04 – 2020-02-05 (×4): 5 mg
  Filled 2020-02-03 (×4): qty 1

## 2020-02-03 MED ORDER — ORAL CARE MOUTH RINSE
15.0000 mL | Freq: Two times a day (BID) | OROMUCOSAL | Status: DC
  Administered 2020-02-03 – 2020-02-26 (×30): 15 mL via OROMUCOSAL

## 2020-02-03 MED ORDER — METOPROLOL TARTRATE 25 MG PO TABS
25.0000 mg | ORAL_TABLET | Freq: Three times a day (TID) | ORAL | Status: DC
  Administered 2020-02-03 – 2020-02-12 (×8): 25 mg
  Filled 2020-02-03 (×14): qty 1

## 2020-02-03 MED ORDER — SERTRALINE HCL 50 MG PO TABS
25.0000 mg | ORAL_TABLET | Freq: Every day | ORAL | Status: DC
  Administered 2020-02-04 – 2020-02-07 (×4): 25 mg
  Filled 2020-02-03 (×5): qty 1

## 2020-02-03 MED ORDER — MILK AND MOLASSES ENEMA
1.0000 | Freq: Every day | RECTAL | Status: DC | PRN
  Filled 2020-02-03: qty 240

## 2020-02-03 MED ORDER — PRO-STAT SUGAR FREE PO LIQD
30.0000 mL | Freq: Two times a day (BID) | ORAL | Status: DC
  Administered 2020-02-03 – 2020-02-12 (×18): 30 mL
  Filled 2020-02-03 (×18): qty 30

## 2020-02-03 MED ORDER — DIPHENHYDRAMINE HCL 12.5 MG/5ML PO ELIX
12.5000 mg | ORAL_SOLUTION | Freq: Four times a day (QID) | ORAL | Status: DC | PRN
  Administered 2020-02-03 – 2020-02-05 (×3): 25 mg
  Filled 2020-02-03 (×3): qty 10

## 2020-02-03 MED ORDER — VITAMIN C 500 MG/5ML PO SYRP
500.0000 mg | ORAL_SOLUTION | Freq: Two times a day (BID) | ORAL | Status: DC
  Administered 2020-02-03 – 2020-02-12 (×18): 500 mg
  Filled 2020-02-03 (×18): qty 5

## 2020-02-03 MED ORDER — MIDODRINE HCL 5 MG PO TABS
10.0000 mg | ORAL_TABLET | Freq: Three times a day (TID) | ORAL | Status: DC
  Administered 2020-02-04 – 2020-02-12 (×16): 10 mg
  Filled 2020-02-03 (×18): qty 2

## 2020-02-03 MED ORDER — LEVOTHYROXINE SODIUM 25 MCG PO TABS
25.0000 ug | ORAL_TABLET | Freq: Every day | ORAL | Status: DC
  Administered 2020-02-04 – 2020-02-11 (×8): 25 ug
  Filled 2020-02-03 (×9): qty 1

## 2020-02-03 MED ORDER — BENZTROPINE MESYLATE 1 MG PO TABS
1.0000 mg | ORAL_TABLET | Freq: Two times a day (BID) | ORAL | Status: DC
  Administered 2020-02-03 – 2020-02-12 (×18): 1 mg
  Filled 2020-02-03 (×18): qty 1

## 2020-02-03 MED ORDER — PROCHLORPERAZINE MALEATE 5 MG PO TABS
5.0000 mg | ORAL_TABLET | Freq: Four times a day (QID) | ORAL | Status: DC | PRN
  Administered 2020-02-05: 5 mg
  Filled 2020-02-03: qty 1

## 2020-02-03 MED ORDER — RENA-VITE PO TABS
1.0000 | ORAL_TABLET | Freq: Every day | ORAL | Status: DC
  Administered 2020-02-03 – 2020-02-11 (×9): 1
  Filled 2020-02-03 (×9): qty 1

## 2020-02-03 MED ORDER — VITAMIN C 500 MG/5ML PO SYRP: 500.0000 mg | ORAL_SOLUTION | Freq: Two times a day (BID) | ORAL | Status: DC

## 2020-02-03 MED ORDER — THIAMINE HCL 100 MG PO TABS
100.0000 mg | ORAL_TABLET | Freq: Every day | ORAL | Status: DC
  Administered 2020-02-04 – 2020-02-12 (×9): 100 mg
  Filled 2020-02-03 (×9): qty 1

## 2020-02-03 MED ORDER — OXYCODONE HCL 5 MG PO TABS
5.0000 mg | ORAL_TABLET | Freq: Four times a day (QID) | ORAL | Status: DC | PRN
  Administered 2020-02-03 – 2020-02-05 (×2): 5 mg
  Filled 2020-02-03 (×2): qty 1

## 2020-02-03 MED ORDER — BISACODYL 10 MG RE SUPP: 10.0000 mg | Freq: Every day | RECTAL | Status: DC | PRN

## 2020-02-03 MED ORDER — MELATONIN 3 MG PO TABS
3.0000 mg | ORAL_TABLET | Freq: Every day | ORAL | Status: DC
  Administered 2020-02-03 – 2020-02-11 (×8): 3 mg
  Filled 2020-02-03 (×9): qty 1

## 2020-02-03 MED ORDER — ACETAMINOPHEN 325 MG PO TABS
325.0000 mg | ORAL_TABLET | ORAL | Status: DC | PRN
  Administered 2020-02-05 – 2020-02-12 (×12): 650 mg
  Filled 2020-02-03 (×13): qty 2

## 2020-02-03 MED ORDER — NON FORMULARY: 3.0000 mg | Freq: Every day | Status: DC

## 2020-02-03 MED ORDER — LORAZEPAM 0.5 MG PO TABS
0.5000 mg | ORAL_TABLET | Freq: Two times a day (BID) | ORAL | Status: DC | PRN
  Administered 2020-02-03 – 2020-02-04 (×2): 0.5 mg
  Filled 2020-02-03 (×2): qty 1

## 2020-02-03 MED ORDER — TRAMADOL HCL 50 MG PO TABS
50.0000 mg | ORAL_TABLET | Freq: Four times a day (QID) | ORAL | Status: DC | PRN
  Administered 2020-02-03 – 2020-02-12 (×12): 50 mg
  Filled 2020-02-03 (×13): qty 1

## 2020-02-03 MED ORDER — CLONAZEPAM 0.5 MG PO TABS
0.5000 mg | ORAL_TABLET | Freq: Three times a day (TID) | ORAL | Status: DC
  Administered 2020-02-03 – 2020-02-04 (×3): 0.5 mg
  Filled 2020-02-03 (×4): qty 1

## 2020-02-03 NOTE — Progress Notes (Signed)
Patient arrived to unit with father and settled into room. No complaints of pain. Oriented to unit routines. Patient had been told by prior floor staff that he passed his swallow test and would be able to eat, this also stated during report. Unable to find results from [facility] in chart, orders still NPO at this time. Requested clarification from MD. Will wait for orders until initiating PO intake.

## 2020-02-03 NOTE — Progress Notes (Signed)
[facility]  ROUNDING NOTE   Subjective:  Patient has now been successfully decannulated. Breathing comfortably. Due for dialysis today.  Objective:  Vital signs in last 24 hours:  Temperature 98.4 pulse 86 respirations 18 blood pressure 117/80  Physical Exam: General: Chronically ill-appearing  Head: Normocephalic, atraumatic. Moist oral mucosal membranes  Eyes: Anicteric  Neck: Decannulated  Lungs:  Clear bilateral, normal effort  Heart: S1S2 no rubs irregular  Abdomen:  Soft, nontender, bowel sounds present, distended  Extremities: 1+ peripheral edema.  Neurologic: Awake, alert, following commands  Skin: Warm/dry, normal skin turgor  Access: LUE AV access    Basic Metabolic Panel: Recent Labs  Lab 01/27/20 1336 01/27/20 1336 01/28/20 0707 01/30/20 0810 02/01/20 1138 02/03/20 0653  NA 137  --   --  135 132* 135  K 4.0  --   --  3.7 3.4* 4.0  CL 93*  --   --  90* 91* 93*  CO2 23  --   --  21* 25 24  GLUCOSE 117*  --   --  100* 112* 106*  BUN 106*  --   --  115* 78* 78*  CREATININE 6.31*  --   --  6.57* 5.94* 5.75*  CALCIUM 9.3   < >  --  9.2 9.0 8.9  MG 2.2  --  2.0  --   --   --   PHOS 4.3  --   --  4.5 2.9 2.5   < > = values in this interval not displayed.    Liver Function Tests: Recent Labs  Lab 01/27/20 1336 01/30/20 0810 02/01/20 1138 02/03/20 0653  ALBUMIN 3.9 3.9 3.7 3.5   No results for input(s): LIPASE, AMYLASE in the last 168 hours. No results for input(s): AMMONIA in the last 168 hours.  CBC: Recent Labs  Lab 01/28/20 0707 01/30/20 0810 02/01/20 1138 02/03/20 0653  WBC 11.6* 10.0 9.7 8.2  HGB 12.6* 12.5* 12.1* 12.3*  HCT 41.9 40.0 39.2 40.3  MCV 92.5 87.5 89.7 89.2  PLT 102* 142* 128* 114*    Cardiac Enzymes: No results for input(s): CKTOTAL, CKMB, CKMBINDEX, TROPONINI in the last 168 hours.  BNP: Invalid input(s): POCBNP  CBG: No results for input(s): GLUCAP in the last 168 hours.  Microbiology: Results  for orders placed or performed during the hospital encounter of 12/24/19  Novel Coronavirus, NAA (hospital order; send-out to ref lab)     Status: None   Collection Time: 02/02/20  3:15 PM   Specimen: Nasopharyngeal Swab; Respiratory  Result Value Ref Range Status   SARS-CoV-2, NAA NOT DETECTED NOT DETECTED Final    Comment: (NOTE) This nucleic acid amplification test was developed and its performance characteristics determined by [employer]. Nucleic acid amplification tests include RT-PCR and TMA. This test has not been FDA cleared or approved. This test has been authorized by FDA under an Emergency Use Authorization (EUA). This test is only authorized for the duration of time the declaration that circumstances exist justifying the authorization of the emergency use of in vitro diagnostic tests for detection of SARS-CoV-2 virus and/or diagnosis of COVID-19 infection under section 564(b)(1) of the Act, 21 U.S.C. 478GNF-6(O) (1), unless the authorization is terminated or revoked sooner. When diagnostic testing is negative, the possibility of a false negative result should be considered in the context of a patient's recent exposures and the presence of clinical signs and symptoms consistent with COVID-19. An individual without symptoms of COVID- 19 and who is not shedding  SARS-CoV-2  virus would expect to have a negative (not detected) result in this assay. Performed At: [facility] [address] [place], [place] [id number] Rush Farmer MD KT:[id number]    [place]  Final    Comment: Performed at [facility], [place] [address] [place], [place] [address]    Coagulation Studies: No results for input(s): LABPROT, INR in the last 72 hours.  Urinalysis: No results for input(s): COLORURINE, LABSPEC, PHURINE, GLUCOSEU, HGBUR, BILIRUBINUR, KETONESUR, PROTEINUR, UROBILINOGEN, NITRITE, LEUKOCYTESUR in the last 72 hours.  Invalid input(s):  APPERANCEUR    Imaging: No results found.   Medications:       Assessment/ Plan:  54 y.o. male with a PMHx of recent cardiac arrest status post hypothermia protocol, ESRD on HD MWF, anemia of chronic kidney disease, secondary hyperparathyroidism, history of polysubstance abuse, hypertension,, chronic systolic heart failure, medical nonadherence who was admitted to [facility] on 12/24/2019 for ongoing management of acute respiratory failure.  1.  ESRD on HD MWF.  Patient doing much better.  Now decannulated.  Due for dialysis today.  2.  Anemia of chronic kidney disease.   Lab Results  Component Value Date   HGB 12.3 (L) 02/03/2020  Hemoglobin 12.3.  Continue to monitor off of Retacrit.   3.  Secondary hyperparathyroidism.   Lab Results  Component Value Date   CALCIUM 8.9 02/03/2020   PHOS 2.5 02/03/2020  Phosphorus now 2.5.  Continue to monitor.  4.  Hyperkalemia.  Resolved.  5.  Hypotension.  Continue albumin with dialysis treatments.    LOS: 0 [place] Kniskern 2/19/20219:13 AM

## 2020-02-03 NOTE — H&P (Addendum)
Physical Medicine and Rehabilitation Admission H&P    CC: Encephalopathy with debility.   HPI:  [name] [name] is a 54 year old male with history of ESRD--HD MWF, CHF, CAF, anemia of chronic disease, NICM, polysubstance abuse; who was originally admitted from his HD center with PEA arrest 3 hours into his HD session.  History taken from chart review due to behavior/cognition.  He received CPR with ACLS protocol and had ROSC after 15 minutes. Emergent cricothyrotomy performed by EMS due to failed intubation. He was treated with cooling protocol.  Echocardiogram showed ejection fraction of 15-20% with septal flattening c/w RV pressure and volume overload and also found to be in A fib with tracheal aspirate positive for H Flu.  CT chest showed possible subsegmental PE on right and left as well as large left pleural effusion.Marland Kitchen  He required vasopressors with CRRT and was started on IV heparin and has been treated with multiple rounds of antibiotics.  He had recurrent VF arrest on 01/23/2020 and was treated with lidocaine and amiodarone gtt.   Dr. Allred/Cards recommended supportive care--patient not a candidate for advanced CHF therapies or ICD due to history of substance abuse as well as poor overall prognosis and no plans for ischemic work up. Cricothyrotomy converted to tracheostomy 11/24/2019 by ENT.  Hospital course further complicated by volume overload with volume overload with hypotension and VDRF. He was discharged to [facility] on 12/23/2019 for vent wean and medical management.   Pericardial effusion monitored with repeat echo showing moderate size effusion without tamponade and he was treated with colchicine.  Gastrostomy tube was placed on 01/09/2020 by Dr. Annamaria Boots.  He developed neck edema on 01/27 and CT neck showed circumferential mucosal thickening involving oropharynx, supraglottic larynx likely due to acute pharyngitis and with supraglottic airway narrowed to 2 mm at narrowest point also incidental  findings of large layering bilateral pleural effusions R>L partially visualized. He was treated with course of Augmentin and steroids. He also underwent thoracocentesis of 250 cc ambler fluid from right lung on 1/28.  He continues to be NPO with tube feeds ongoing but has had difficulty tolerating this due to persistent abdominal distension as well as high residuals. Abdominal ultrasound showed minimal ascites and multiple KUBs have been negative for ileus. His tube feed slowly increased to 40 cc/hr by 02/16 and he is tolerating this without discomfort. He was weaned to [facility] 02/09, capping trials were started on 02/15 and he was decannulated without difficulty on 02/01/2020.  Anxiety issues with reports of panic disorder managed with scheduled Klonopin and ativan prn. Seroquel discontinued due to concern of EPS and on cogentin. Patient with cognitive deficits as well as debility due to ABI with multiple medical issues.  Therapy ongoing and CIR recommended due to functional decline. Please see preadmission assessment from earlier today as well.  Review of Systems  Constitutional: Positive for malaise/fatigue. Negative for chills and fever.  HENT: Negative.   Eyes: Negative for blurred vision.  Respiratory: Positive for cough.        DOE  Cardiovascular: Negative for chest pain and leg swelling.  Gastrointestinal: Negative for nausea and vomiting.  Musculoskeletal: Negative for myalgias.  Neurological: Positive for weakness. Negative for headaches.  All other systems reviewed and are negative.  Past Medical History:  Diagnosis Date  . Abdominal distension 08/10/2018  . Acute on chronic respiratory failure with hypoxia ([place])   . Acute on chronic systolic and diastolic heart failure, NYHA class 4 ([place])   .  Acute pulmonary embolism without acute cor pulmonale (HCC)   . Alcohol abuse 11/26/2010   Qualifier: Diagnosis of  By: [name] MD, Pershing Proud  . Anemia due to blood loss, acute 07/15/2012   . CHF (congestive heart failure) ([place])   . Cholecystitis 04/18/2014  . Chronic atrial fibrillation ([place])   . Cigarette smoker    has currently quit  . Cocaine abuse ([place]) 11/26/2010   Qualifier: Diagnosis of  By: [name] MD, Benjamine Mola  has been over a year  . Combined congestive systolic and diastolic heart failure ([place]) 04/18/2014   A. Echo 8/13: Severe LVH, EF 40-45%, inferoposterior akinesis, grade 2 diastolic dysfunction, moderate LAE, mild RVE, mildly reduced RVSF, mild RAE; cannot rule out R atrial mass-suggest TEE or cardiac MRI  //  B. Echo 3/17: Mild LVH, EF 25-30%, diffuse HK, grade 2 diastolic dysfunction, mild MR, severe LAE,  moderately reduced RVSF, severe RAE, mild TR, moderate PI, PASP 55 mmHg    . Dyspnea   . End stage renal disease on dialysis ([place])   . ESRD (end stage renal disease) on dialysis [facility])    MWF- [place] (05/18/2017)  . Hemodialysis patient ([place])    M,W,F  . Hernia, inguinal, right 08/05/2016  . Hypertension   . 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 Hypocalcemia 07/15/2012  . Medical non-compliance   . Non-ischemic cardiomyopathy ([place])    A. R/L HC 3/17: Normal coronary arteries, moderate pulmonary hypertension (PASP 65 mmHg), elevated LV filling pressures (LVEDP 45 mmHg)   . NSVT (nonsustained ventricular tachycardia) ([place]) 02/22/2016  . NSVT (nonsustained ventricular tachycardia) ([place]) 02/22/2016  . Pneumonia 11/12/2017  . Polysubstance abuse ([place])   . Prolonged Q-T interval on ECG 05/18/2017  . Prolonged QT interval 05/18/2017  . Restless leg syndrome   . Solitary pulmonary nodule 10/10/2015   See cxr 10/09/2015 - CT rec 10/10/2015 >>>   . Upper airway cough syndrome 10/09/2015   Off ACEi around 1st Oct 2016  - Sinus CT 10/10/2015 >>>      Past Surgical History:  Procedure Laterality Date  . [place] TRANSPOSITION  01/19/2013     Procedure: BASCILIC VEIN TRANSPOSITION;  Surgeon: Conrad [place], MD;  Location: [facility];  Service: Vascular;  Laterality: Left;  left 2nd stage basilic vein transposition  . CARDIAC CATHETERIZATION N/A 02/25/2016   Procedure: Right/Left Heart Cath and Coronary Angiography;  Surgeon: Burnell Blanks, MD;  Location: [place] CV LAB;  Service: Cardiovascular;  Laterality: N/A;  . INGUINAL HERNIA REPAIR Right 08/05/2016   Procedure: RIGHT INGUINAL HERNIA REPAIR WITH MESH;  Surgeon: Donnie Mesa, MD;  Location: [facility];  Service: General;  Laterality: Right;  . INSERTION OF DIALYSIS CATHETER  07/17/2012   Procedure: INSERTION OF DIALYSIS CATHETER;  Surgeon: Conrad Winigan, MD;  Location: [facility];  Service: Vascular;  Laterality: Right;  right internal jugular  . INSERTION OF MESH Right 08/05/2016   Procedure: INSERTION OF MESH;  Surgeon: Donnie Mesa, MD;  Location: ;  Service: General;  Laterality: Right;  . IR GASTROSTOMY TUBE MOD SED  01/09/2020  . IR THORACENTESIS ASP PLEURAL SPACE W/IMG GUIDE  01/12/2020  . REVISION OF ARTERIOVENOUS GORETEX GRAFT Left 5/57/3220   Procedure: PLICATION OF ARTERIOVENOUS FISTULA LEFT ARM;  Surgeon: Elam Dutch, MD;  Location: [facility];  Service: Vascular;  Laterality: Left;  . REVISON  OF ARTERIOVENOUS FISTULA Left 01/06/2017   Procedure: REVISON OF ARTERIOVENOUS FISTULA;  Surgeon: Conrad [place], MD;  Location: [facility];  Service: Vascular;  Laterality: Left;  . REVISON OF ARTERIOVENOUS FISTULA Left 10/27/2018   Procedure: REVISION PLICATION OF ARTERIOVENOUS FISTULA  LEFT UPPER ARM;  Surgeon: Waynetta Sandy, MD;  Location: [facility];  Service: Vascular;  Laterality: Left;  . TRACHEOSTOMY TUBE PLACEMENT N/A 11/24/2019   Procedure: TRACHEOSTOMY;  Surgeon: Izora Gala, MD;  Location: [place];  Service: ENT;  Laterality: N/A;  . UMBILICAL HERNIA REPAIR N/A 08/05/2016   Procedure: [place];  Surgeon: Donnie Mesa, MD;  Location: [place];   Service: General;  Laterality: N/A;  . VENOGRAM N/A 08/09/2012   Procedure: VENOGRAM;  Surgeon: Conrad [place], MD;  Location: [facility] CATH LAB;  Service: Cardiovascular;  Laterality: N/A;    Family History  Problem Relation Age of Onset  . Hypertension Mother   . Diabetes Mother   . Renal Disease Mother   . Hypertension Father     Social History:  Lives with parents. Mother HD dependent. Per reports that he quit smoking about 15 months ago. His smoking use included cigarettes. He quit after 30.00 years of use. He has never used smokeless tobacco. He reports current alcohol use of about 4.0 standard drinks of alcohol per week. He reports that he does not use drugs.    Allergies  Allergen Reactions  . Losartan Cough    Medications Prior to Admission  Medication Sig Dispense Refill  . acetaminophen (TYLENOL) 650 MG suppository Place 1 suppository (650 mg total) rectally every 6 (six) hours as needed for fever. 12 suppository 0  . Amino Acids-Protein Hydrolys (FEEDING SUPPLEMENT, PRO-STAT SUGAR FREE 64,) LIQD Place 30 mLs into feeding tube 5 (five) times daily. 887 mL 0  . amiodarone (PACERONE) 200 MG tablet Place 1 tablet (200 mg total) into feeding tube daily. 30 tablet 1  . apixaban (ELIQUIS) 5 MG TABS tablet Place 1 tablet (5 mg total) into feeding tube 2 (two) times daily. 60 tablet 0  . calcium acetate (PHOSLO) 667 MG capsule Take 3 capsules (2,001 mg total) by mouth 3 (three) times daily with meals. (Patient taking differently: Take 1,334-3,335 mg by mouth See admin instructions. Take 3-5 capsules (2001 - 3335 mg) by mouth 3 times daily with large meals, take 2 capsules (1334 mg) with snacks) 90 capsule 0  . clonazePAM (KLONOPIN) 1 MG tablet Place 1 tablet (1 mg total) into feeding tube 3 (three) times daily as needed (Vent weaning related anxiety). 90 tablet 0  . Darbepoetin Alfa (ARANESP) 100 MCG/0.5ML SOSY injection Inject 0.5 mLs (100 mcg total) into the vein every Wednesday with  hemodialysis. 4.2 mL 0  . docusate (COLACE) 50 MG/5ML liquid Place 10 mLs (100 mg total) into feeding tube daily. 100 mL 0  . insulin aspart (NOVOLOG) 100 UNIT/ML injection Inject 0-9 Units into the skin every 4 (four) hours. 10 mL 11  . midodrine (PROAMATINE) 10 MG tablet Place 1 tablet (10 mg total) into feeding tube 3 (three) times daily with meals. 90 tablet 0  . multivitamin (RENA-VIT) TABS tablet Take 1 tablet by mouth at bedtime. 30 tablet 0  . nutrition supplement, JUVEN, (JUVEN) PACK Place 1 packet into feeding tube 2 (two) times daily. 60 packet 0  . Nutritional Supplements (FEEDING SUPPLEMENT, VITAL 1.5 CAL,) LIQD Place 1,000 mLs into feeding tube continuous. 1000 mL 0  . oxyCODONE (ROXICODONE) 5 MG/5ML solution  Take 7.5 mLs (7.5 mg total) by mouth every 4 (four) hours as needed for moderate pain. 473 mL 0  . pantoprazole sodium (PROTONIX) 40 mg/20 mL PACK Place 20 mLs (40 mg total) into feeding tube at bedtime. 600 mL 0  . prochlorperazine (COMPAZINE) 5 MG tablet Place 1 tablet (5 mg total) into feeding tube every 6 (six) hours as needed for nausea or vomiting (hiccups). 30 tablet 0  . QUEtiapine (SEROQUEL) 25 MG tablet Take 1 tablet (25 mg total) by mouth 2 (two) times daily. 60 tablet 0    Drug Regimen Review  Drug regimen was reviewed and remains appropriate with no significant issues identified  Home: Home Living Living Arrangements: (lives with Mom and Dad) Available Help at Discharge: Family, Available 24 hours/day Type of Home: House Home Access: Stairs to enter [employer] of Steps: 2 Entrance Stairs-Rails: Can reach both Home Layout: One level Bathroom Shower/Tub: Optometrist: Yes Home Equipment: None  Lives With: (parents)   Functional History: Prior Function Level of Independence: Independent  Functional Status:  Mobility:          ADL:    Cognition:   Cognition Difficult to assess  due to: Tracheostomy   Blood pressure 104/88, pulse (!) 101, resp. rate 20, SpO2 100 %. Physical Exam  Nursing note and vitals reviewed. Constitutional: He appears well-developed.  Frail  HENT:  Head: Normocephalic and atraumatic.  Eyes: EOM are normal. Scleral icterus is present.  Neck:  +Stoma  Respiratory: Effort normal. No stridor.  Intermittent productive cough with thick red tinged  expectorate.   GI: He exhibits distension.  +PEG in place --dry with minimal crusted drainage.   Musculoskeletal:     Comments: B/l toes with tenderness  Neurological: He is alert.  Oriented to self and place "[facility]". Able to state age and DOB. He was unable to recall details of admission, month or his medical history.  Motor: 3+-4 -/5 throughout Dysphonia  Skin:  Scattered healing lesions on BLE and BUE.  Bilateral toes with gangrene  Psychiatric: His affect is blunt. His speech is delayed. He is slowed.    Results for orders placed or performed during the hospital encounter of 12/24/19 (from the past 48 hour(s))  CBC     Status: Abnormal   Collection Time: 02/01/20 11:38 AM  Result Value Ref Range   WBC 9.7 4.0 - 10.5 K/uL   RBC 4.37 4.22 - 5.81 MIL/uL   Hemoglobin 12.1 (L) 13.0 - 17.0 g/dL   HCT 39.2 39.0 - 52.0 %   MCV 89.7 80.0 - 100.0 fL   MCH 27.7 26.0 - 34.0 pg   MCHC 30.9 30.0 - 36.0 g/dL   RDW 20.7 (H) 11.5 - 15.5 %   Platelets 128 (L) 150 - 400 K/uL   nRBC 0.9 (H) 0.0 - 0.2 %    Comment: Performed at [facility], [address] [place], [place] [address]  Renal function panel     Status: Abnormal   Collection Time: 02/01/20 11:38 AM  Result Value Ref Range   Sodium 132 (L) 135 - 145 mmol/L   Potassium 3.4 (L) 3.5 - 5.1 mmol/L   Chloride 91 (L) 98 - 111 mmol/L   CO2 25 22 - 32 mmol/L   Glucose, Bld 112 (H) 70 - 99 mg/dL   BUN 78 (H) 6 - 20 mg/dL   Creatinine, Ser 5.94 (H) 0.61 - 1.24 mg/dL   Calcium  9.0 8.9 - 10.3 mg/dL   Phosphorus 2.9 2.5 - 4.6  mg/dL   Albumin 3.7 3.5 - 5.0 g/dL   GFR calc non Af Amer 10 (L) >60 mL/min   GFR calc Af Amer 12 (L) >60 mL/min   Anion gap 16 (H) 5 - 15    Comment: Performed at [facility] [address], [place], [place] [address]  Novel Coronavirus, NAA (hospital order; send-out to ref lab)     Status: None   Collection Time: 02/02/20  3:15 PM   Specimen: Nasopharyngeal Swab; Respiratory  Result Value Ref Range   SARS-CoV-2, NAA NOT DETECTED NOT DETECTED    Comment: (NOTE) This nucleic acid amplification test was developed and its performance characteristics determined by [employer], [employer] and [employer]. Nucleic acid amplification tests include RT-PCR and TMA. This test has not been FDA cleared or approved. This test has been authorized by FDA under an Emergency Use Authorization (EUA). This test is only authorized for the duration of time the declaration that circumstances exist justifying the authorization of the emergency use of in vitro diagnostic tests for detection of SARS-CoV-2 virus and/or diagnosis of COVID-19 infection under section 564(b)(1) of the Act, 21 U.S.C. 027OZD-6(U) (1), unless the authorization is terminated or revoked sooner. When diagnostic testing is negative, the possibility of a false negative result should be considered in the context of a patient's recent exposures and the presence of clinical signs and symptoms consistent with COVID-19. An individual without symptoms of COVID- 19 and who is not shedding SARS-CoV-2  virus would expect to have a negative (not detected) result in this assay. Performed At: [facility] [place], [place] [id number] Rush Farmer MD ZD:[id number]    Coronavirus Source NASOPHARYNGEAL     Comment: Performed at [facility], [place] [address] [place], [place] [address]  CBC     Status: Abnormal   Collection Time: 02/03/20  6:53 AM  Result Value Ref Range   WBC 8.2 4.0 - 10.5 K/uL   RBC 4.52 4.22 - 5.81 MIL/uL    Hemoglobin 12.3 (L) 13.0 - 17.0 g/dL   HCT 40.3 39.0 - 52.0 %   MCV 89.2 80.0 - 100.0 fL   MCH 27.2 26.0 - 34.0 pg   MCHC 30.5 30.0 - 36.0 g/dL   RDW 20.9 (H) 11.5 - 15.5 %   Platelets 114 (L) 150 - 400 K/uL    Comment: REPEATED TO VERIFY PLATELET COUNT CONFIRMED BY SMEAR Immature Platelet Fraction may be clinically indicated, consider ordering this additional test [id number]    nRBC 0.5 (H) 0.0 - 0.2 %    Comment: Performed at [facility], [place] [address], [place], [place] [address]  Renal function panel     Status: Abnormal   Collection Time: 02/03/20  6:53 AM  Result Value Ref Range   Sodium 135 135 - 145 mmol/L   Potassium 4.0 3.5 - 5.1 mmol/L   Chloride 93 (L) 98 - 111 mmol/L   CO2 24 22 - 32 mmol/L   Glucose, Bld 106 (H) 70 - 99 mg/dL   BUN 78 (H) 6 - 20 mg/dL   Creatinine, Ser 5.75 (H) 0.61 - 1.24 mg/dL   Calcium 8.9 8.9 - 10.3 mg/dL   Phosphorus 2.5 2.5 - 4.6 mg/dL   Albumin 3.5 3.5 - 5.0 g/dL   GFR calc non Af Amer 10 (L) >60 mL/min   GFR calc Af Amer 12 (L) >60 mL/min   Anion gap  18 (H) 5 - 15    Comment: Performed at [facility], [place] [address], [place], [place] [address]   No results found.     Medical Problem List and Plan: 1.  Deficits with mobility, endurance, self-care secondary to encephalopathy with debility.  -patient may not shower  -ELOS/Goals: 7-11 days/supervision/mod I  Admit to CIR 2.  Antithrombotics: -PE/anticoagulation:  Pharmaceutical: Other (comment)--on low dose Eliquis  -antiplatelet therapy:  N/A 3. Pain Management: Oxycodone or tramadol prn 4. Mood: LCSW to follow for evaluation and support.   -antipsychotic agents: N/A 5. Neuropsych: This patient is not fully capable of making decisions on his own behalf. 6. Skin/Wound Care: Dry dressing to bilateral feet.   Monitor stoma for healing 7. Fluids/Electrolytes/Nutrition: Strict I/O.    Daily weights. 8. A fib/NSVT: On amiodarone and metoprolol for rate control.    Monitor with increased activity. 9.  ESRD: HD MWF at the end of the day to help with activity tolerance.  10. Pericardial effusion: Has been on colchicine since 1/19 and has been weaned to prednisone 10 mg daily. Last echo 1/29 shows decrease to small effusion. 11. Hypotension: Continue midodrine 10 mg tid.  Monitor with increased exertion. 12. Dysphagia: Tolerating Nephro with Reglan 5 mg tid to help with residuals.  S/p PEG  Advance diet as tolerated. 13. Dry gangrene bilateral feet: Needs to wear shoes for protection. Dry dressing. Maintain adequate nutritional status.  Monitor demarcation/healing. 14. Anxiety/depression: On Zoloft 25 mg/day with klonopin tid.  Team support.   Bary Leriche, PA-C 02/03/2020  I have personally performed a face to face diagnostic evaluation, including, but not limited to relevant history and physical exam findings, of this patient and developed relevant assessment and plan.  Additionally, I have reviewed and concur with the physician assistant's documentation above.  Delice Lesch, MD, ABPMR

## 2020-02-03 NOTE — H&P (Signed)
Physical Medicine and Rehabilitation Admission H&P    CC: Encephalopathy with debility.   HPI:  [name] [name] is a 54 year old male with history of ESRD--HD MWF, CHF, CAF, anemia of chronic disease, NICM, polysubstance abuse; who was originally admitted from his HD center with PEA arrest 3 hours into his HD session.  History taken from chart review due to behavior/cognition.  He received CPR with ACLS protocol and had ROSC after 15 minutes. Emergent cricothyrotomy performed by EMS due to failed intubation. He was treated with cooling protocol.  Echocardiogram showed ejection fraction of 15-20% with septal flattening c/w RV pressure and volume overload and also found to be in A fib with tracheal aspirate positive for H Flu.  CT chest showed possible subsegmental PE on right and left as well as large left pleural effusion.Marland Kitchen  He required vasopressors with CRRT and was started on IV heparin and has been treated with multiple rounds of antibiotics.  He had recurrent VF arrest on 01/23/2020 and was treated with lidocaine and amiodarone gtt.   Dr. Allred/Cards recommended supportive care--patient not a candidate for advanced CHF therapies or ICD due to history of substance abuse as well as poor overall prognosis and no plans for ischemic work up. Cricothyrotomy converted to tracheostomy 11/24/2019 by ENT.  Hospital course further complicated by volume overload with volume overload with hypotension and VDRF. He was discharged to [facility] on 12/23/2019 for vent wean and medical management.   Pericardial effusion monitored with repeat echo showing moderate size effusion without tamponade and he was treated with colchicine.  Gastrostomy tube was placed on 01/09/2020 by Dr. Annamaria Boots.  He developed neck edema on 01/27 and CT neck showed circumferential mucosal thickening involving oropharynx, supraglottic larynx likely due to acute pharyngitis and with supraglottic airway narrowed to 2 mm at narrowest point also incidental  findings of large layering bilateral pleural effusions R>L partially visualized. He was treated with course of Augmentin and steroids. He also underwent thoracocentesis of 250 cc ambler fluid from right lung on 1/28.  He continues to be NPO with tube feeds ongoing but has had difficulty tolerating this due to persistent abdominal distension as well as high residuals. Abdominal ultrasound showed minimal ascites and multiple KUBs have been negative for ileus. His tube feed slowly increased to 40 cc/hr by 02/16 and he is tolerating this without discomfort. He was weaned to [facility] 02/09, capping trials were started on 02/15 and he was decannulated without difficulty on 02/01/2020.  Anxiety issues with reports of panic disorder managed with scheduled Klonopin and ativan prn. Seroquel discontinued due to concern of EPS and on cogentin. Patient with cognitive deficits as well as debility due to ABI with multiple medical issues.  Therapy ongoing and CIR recommended due to functional decline. Please see preadmission assessment from earlier today as well.  Review of Systems  Constitutional: Positive for malaise/fatigue. Negative for chills and fever.  HENT: Negative.   Eyes: Negative for blurred vision.  Respiratory: Positive for cough.        DOE  Cardiovascular: Negative for chest pain and leg swelling.  Gastrointestinal: Negative for nausea and vomiting.  Musculoskeletal: Negative for myalgias.  Neurological: Positive for weakness. Negative for headaches.  All other systems reviewed and are negative.  Past Medical History:  Diagnosis Date  . Abdominal distension 08/10/2018  . Acute on chronic respiratory failure with hypoxia ([place])   . Acute on chronic systolic and diastolic heart failure, NYHA class 4 ([place])   .  Acute pulmonary embolism without acute cor pulmonale (HCC)   . Alcohol abuse 11/26/2010   Qualifier: Diagnosis of  By: [name] MD, Pershing Proud  . Anemia due to blood loss, acute 07/15/2012   . CHF (congestive heart failure) ([place])   . Cholecystitis 04/18/2014  . Chronic atrial fibrillation ([place])   . Cigarette smoker    has currently quit  . Cocaine abuse ([place]) 11/26/2010   Qualifier: Diagnosis of  By: [name] MD, Benjamine Mola  has been over a year  . Combined congestive systolic and diastolic heart failure ([place]) 04/18/2014   A. Echo 8/13: Severe LVH, EF 40-45%, inferoposterior akinesis, grade 2 diastolic dysfunction, moderate LAE, mild RVE, mildly reduced RVSF, mild RAE; cannot rule out R atrial mass-suggest TEE or cardiac MRI  //  B. Echo 3/17: Mild LVH, EF 25-30%, diffuse HK, grade 2 diastolic dysfunction, mild MR, severe LAE,  moderately reduced RVSF, severe RAE, mild TR, moderate PI, PASP 55 mmHg    . Dyspnea   . End stage renal disease on dialysis ([place])   . ESRD (end stage renal disease) on dialysis [facility])    MWF- [place] (05/18/2017)  . Hemodialysis patient ([place])    M,W,F  . Hernia, inguinal, right 08/05/2016  . Hypertension   . 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 Hypocalcemia 07/15/2012  . Medical non-compliance   . Non-ischemic cardiomyopathy ([place])    A. R/L HC 3/17: Normal coronary arteries, moderate pulmonary hypertension (PASP 65 mmHg), elevated LV filling pressures (LVEDP 45 mmHg)   . NSVT (nonsustained ventricular tachycardia) ([place]) 02/22/2016  . NSVT (nonsustained ventricular tachycardia) ([place]) 02/22/2016  . Pneumonia 11/12/2017  . Polysubstance abuse ([place])   . Prolonged Q-T interval on ECG 05/18/2017  . Prolonged QT interval 05/18/2017  . Restless leg syndrome   . Solitary pulmonary nodule 10/10/2015   See cxr 10/09/2015 - CT rec 10/10/2015 >>>   . Upper airway cough syndrome 10/09/2015   Off ACEi around 1st Oct 2016  - Sinus CT 10/10/2015 >>>      Past Surgical History:  Procedure Laterality Date  . [place] TRANSPOSITION  01/19/2013     Procedure: BASCILIC VEIN TRANSPOSITION;  Surgeon: Conrad Maunawili, MD;  Location: [facility] [place];  Service: Vascular;  Laterality: Left;  left 2nd stage basilic vein transposition  . CARDIAC CATHETERIZATION N/A 02/25/2016   Procedure: Right/Left Heart Cath and Coronary Angiography;  Surgeon: Burnell Blanks, MD;  Location: [place] CV LAB;  Service: Cardiovascular;  Laterality: N/A;  . INGUINAL HERNIA REPAIR Right 08/05/2016   Procedure: RIGHT INGUINAL HERNIA REPAIR WITH MESH;  Surgeon: Donnie Mesa, MD;  Location: [place];  Service: General;  Laterality: Right;  . INSERTION OF DIALYSIS CATHETER  07/17/2012   Procedure: INSERTION OF DIALYSIS CATHETER;  Surgeon: Conrad Havre, MD;  Location: [place];  Service: Vascular;  Laterality: Right;  right internal jugular  . INSERTION OF MESH Right 08/05/2016   Procedure: INSERTION OF MESH;  Surgeon: Donnie Mesa, MD;  Location: [place];  Service: General;  Laterality: Right;  . IR GASTROSTOMY TUBE MOD SED  01/09/2020  . IR THORACENTESIS ASP PLEURAL SPACE W/IMG GUIDE  01/12/2020  . REVISION OF ARTERIOVENOUS GORETEX GRAFT Left 06/12/4764   Procedure: PLICATION OF ARTERIOVENOUS FISTULA LEFT ARM;  Surgeon: Elam Dutch, MD;  Location: [place];  Service: Vascular;  Laterality: Left;  . REVISON  OF ARTERIOVENOUS FISTULA Left 01/06/2017   Procedure: REVISON OF ARTERIOVENOUS FISTULA;  Surgeon: Conrad Cloverdale, MD;  Location: [place];  Service: Vascular;  Laterality: Left;  . REVISON OF ARTERIOVENOUS FISTULA Left 10/27/2018   Procedure: REVISION PLICATION OF ARTERIOVENOUS FISTULA  LEFT UPPER ARM;  Surgeon: Waynetta Sandy, MD;  Location: [place];  Service: Vascular;  Laterality: Left;  . TRACHEOSTOMY TUBE PLACEMENT N/A 11/24/2019   Procedure: TRACHEOSTOMY;  Surgeon: Izora Gala, MD;  Location: [place];  Service: ENT;  Laterality: N/A;  . UMBILICAL HERNIA REPAIR N/A 08/05/2016   Procedure: [place];  Surgeon: Donnie Mesa, MD;  Location: [place];   Service: General;  Laterality: N/A;  . VENOGRAM N/A 08/09/2012   Procedure: VENOGRAM;  Surgeon: Conrad Altenburg, MD;  Location: [facility] CATH LAB;  Service: Cardiovascular;  Laterality: N/A;    Family History  Problem Relation Age of Onset  . Hypertension Mother   . Diabetes Mother   . Renal Disease Mother   . Hypertension Father     Social History:  Lives with parents. Mother HD dependent. Per reports that he quit smoking about 15 months ago. His smoking use included cigarettes. He quit after 30.00 years of use. He has never used smokeless tobacco. He reports current alcohol use of about 4.0 standard drinks of alcohol per week. He reports that he does not use drugs.    Allergies  Allergen Reactions  . Losartan Cough    Medications Prior to Admission  Medication Sig Dispense Refill  . acetaminophen (TYLENOL) 650 MG suppository Place 1 suppository (650 mg total) rectally every 6 (six) hours as needed for fever. 12 suppository 0  . Amino Acids-Protein Hydrolys (FEEDING SUPPLEMENT, PRO-STAT SUGAR FREE 64,) LIQD Place 30 mLs into feeding tube 5 (five) times daily. 887 mL 0  . amiodarone (PACERONE) 200 MG tablet Place 1 tablet (200 mg total) into feeding tube daily. 30 tablet 1  . apixaban (ELIQUIS) 5 MG TABS tablet Place 1 tablet (5 mg total) into feeding tube 2 (two) times daily. 60 tablet 0  . calcium acetate (PHOSLO) 667 MG capsule Take 3 capsules (2,001 mg total) by mouth 3 (three) times daily with meals. (Patient taking differently: Take 1,334-3,335 mg by mouth See admin instructions. Take 3-5 capsules (2001 - 3335 mg) by mouth 3 times daily with large meals, take 2 capsules (1334 mg) with snacks) 90 capsule 0  . clonazePAM (KLONOPIN) 1 MG tablet Place 1 tablet (1 mg total) into feeding tube 3 (three) times daily as needed (Vent weaning related anxiety). 90 tablet 0  . Darbepoetin Alfa (ARANESP) 100 MCG/0.5ML SOSY injection Inject 0.5 mLs (100 mcg total) into the vein every Wednesday with  hemodialysis. 4.2 mL 0  . docusate (COLACE) 50 MG/5ML liquid Place 10 mLs (100 mg total) into feeding tube daily. 100 mL 0  . insulin aspart (NOVOLOG) 100 UNIT/ML injection Inject 0-9 Units into the skin every 4 (four) hours. 10 mL 11  . midodrine (PROAMATINE) 10 MG tablet Place 1 tablet (10 mg total) into feeding tube 3 (three) times daily with meals. 90 tablet 0  . multivitamin (RENA-VIT) TABS tablet Take 1 tablet by mouth at bedtime. 30 tablet 0  . nutrition supplement, JUVEN, (JUVEN) PACK Place 1 packet into feeding tube 2 (two) times daily. 60 packet 0  . Nutritional Supplements (FEEDING SUPPLEMENT, VITAL 1.5 CAL,) LIQD Place 1,000 mLs into feeding tube continuous. 1000 mL 0  . oxyCODONE (ROXICODONE) 5 MG/5ML solution  Take 7.5 mLs (7.5 mg total) by mouth every 4 (four) hours as needed for moderate pain. 473 mL 0  . pantoprazole sodium (PROTONIX) 40 mg/20 mL PACK Place 20 mLs (40 mg total) into feeding tube at bedtime. 600 mL 0  . prochlorperazine (COMPAZINE) 5 MG tablet Place 1 tablet (5 mg total) into feeding tube every 6 (six) hours as needed for nausea or vomiting (hiccups). 30 tablet 0  . QUEtiapine (SEROQUEL) 25 MG tablet Take 1 tablet (25 mg total) by mouth 2 (two) times daily. 60 tablet 0    Drug Regimen Review  Drug regimen was reviewed and remains appropriate with no significant issues identified  Home: Home Living Living Arrangements: (lives with Mom and Dad) Available Help at Discharge: Family, Available 24 hours/day Type of Home: House Home Access: Stairs to enter [employer] of Steps: 2 Entrance Stairs-Rails: Can reach both Home Layout: One level Bathroom Shower/Tub: Optometrist: Yes Home Equipment: None  Lives With: (parents)   Functional History: Prior Function Level of Independence: Independent  Functional Status:  Mobility:          ADL:    Cognition:   Cognition Difficult to assess  due to: Tracheostomy   Blood pressure 104/88, pulse (!) 101, resp. rate 20, SpO2 100 %. Physical Exam  Nursing note and vitals reviewed. Constitutional: He appears well-developed.  Frail  HENT:  Head: Normocephalic and atraumatic.  Eyes: EOM are normal. Scleral icterus is present.  Neck:  +Stoma  Respiratory: Effort normal. No stridor.  Intermittent productive cough with thick red tinged  expectorate.   GI: He exhibits distension.  +PEG in place --dry with minimal crusted drainage.   Musculoskeletal:     Comments: B/l toes with tenderness  Neurological: He is alert.  Oriented to self and place "[facility]". Able to state age and DOB. He was unable to recall details of admission, month or his medical history.  Motor: 3+-4 -/5 throughout Dysphonia  Skin:  Scattered healing lesions on BLE and BUE.  Bilateral toes with gangrene  Psychiatric: His affect is blunt. His speech is delayed. He is slowed.    Results for orders placed or performed during the hospital encounter of 12/24/19 (from the past 48 hour(s))  CBC     Status: Abnormal   Collection Time: 02/01/20 11:38 AM  Result Value Ref Range   WBC 9.7 4.0 - 10.5 K/uL   RBC 4.37 4.22 - 5.81 MIL/uL   Hemoglobin 12.1 (L) 13.0 - 17.0 g/dL   HCT 39.2 39.0 - 52.0 %   MCV 89.7 80.0 - 100.0 fL   MCH 27.7 26.0 - 34.0 pg   MCHC 30.9 30.0 - 36.0 g/dL   RDW 20.7 (H) 11.5 - 15.5 %   Platelets 128 (L) 150 - 400 K/uL   nRBC 0.9 (H) 0.0 - 0.2 %    Comment: Performed at [facility], [address] [place], [place] [address]  Renal function panel     Status: Abnormal   Collection Time: 02/01/20 11:38 AM  Result Value Ref Range   Sodium 132 (L) 135 - 145 mmol/L   Potassium 3.4 (L) 3.5 - 5.1 mmol/L   Chloride 91 (L) 98 - 111 mmol/L   CO2 25 22 - 32 mmol/L   Glucose, Bld 112 (H) 70 - 99 mg/dL   BUN 78 (H) 6 - 20 mg/dL   Creatinine, Ser 5.94 (H) 0.61 - 1.24 mg/dL   Calcium  9.0 8.9 - 10.3 mg/dL   Phosphorus 2.9 2.5 - 4.6  mg/dL   Albumin 3.7 3.5 - 5.0 g/dL   GFR calc non Af Amer 10 (L) >60 mL/min   GFR calc Af Amer 12 (L) >60 mL/min   Anion gap 16 (H) 5 - 15    Comment: Performed at [facility] [address], [place], [place] [address]  Novel Coronavirus, NAA (hospital order; send-out to ref lab)     Status: None   Collection Time: 02/02/20  3:15 PM   Specimen: Nasopharyngeal Swab; Respiratory  Result Value Ref Range   SARS-CoV-2, NAA NOT DETECTED NOT DETECTED    Comment: (NOTE) This nucleic acid amplification test was developed and its performance characteristics determined by [employer]. Nucleic acid amplification tests include RT-PCR and TMA. This test has not been FDA cleared or approved. This test has been authorized by FDA under an Emergency Use Authorization (EUA). This test is only authorized for the duration of time the declaration that circumstances exist justifying the authorization of the emergency use of in vitro diagnostic tests for detection of SARS-CoV-2 virus and/or diagnosis of COVID-19 infection under section 564(b)(1) of the Act, 21 U.S.C. 397QBH-4(L) (1), unless the authorization is terminated or revoked sooner. When diagnostic testing is negative, the possibility of a false negative result should be considered in the context of a patient's recent exposures and the presence of clinical signs and symptoms consistent with COVID-19. An individual without symptoms of COVID- 19 and who is not shedding SARS-CoV-2  virus would expect to have a negative (not detected) result in this assay. Performed At: [facility] [place], [place] [id number] Rush Farmer MD [id number]    Coronavirus Source NASOPHARYNGEAL     Comment: Performed at [facility], [place] [address] [place], [place] [address]  CBC     Status: Abnormal   Collection Time: 02/03/20  6:53 AM  Result Value Ref Range   WBC 8.2 4.0 - 10.5 K/uL   RBC 4.52 4.22 - 5.81 MIL/uL    Hemoglobin 12.3 (L) 13.0 - 17.0 g/dL   HCT 40.3 39.0 - 52.0 %   MCV 89.2 80.0 - 100.0 fL   MCH 27.2 26.0 - 34.0 pg   MCHC 30.5 30.0 - 36.0 g/dL   RDW 20.9 (H) 11.5 - 15.5 %   Platelets 114 (L) 150 - 400 K/uL    Comment: REPEATED TO VERIFY PLATELET COUNT CONFIRMED BY SMEAR Immature Platelet Fraction may be clinically indicated, consider ordering this additional test [id number]    nRBC 0.5 (H) 0.0 - 0.2 %    Comment: Performed at [facility], [place] [address] [place], [place] [address]  Renal function panel     Status: Abnormal   Collection Time: 02/03/20  6:53 AM  Result Value Ref Range   Sodium 135 135 - 145 mmol/L   Potassium 4.0 3.5 - 5.1 mmol/L   Chloride 93 (L) 98 - 111 mmol/L   CO2 24 22 - 32 mmol/L   Glucose, Bld 106 (H) 70 - 99 mg/dL   BUN 78 (H) 6 - 20 mg/dL   Creatinine, Ser 5.75 (H) 0.61 - 1.24 mg/dL   Calcium 8.9 8.9 - 10.3 mg/dL   Phosphorus 2.5 2.5 - 4.6 mg/dL   Albumin 3.5 3.5 - 5.0 g/dL   GFR calc non Af Amer 10 (L) >60 mL/min   GFR calc Af Amer 12 (L) >60 mL/min   Anion gap  18 (H) 5 - 15    Comment: Performed at [facility], [place] [address] [place], [place] [address]   No results found.     Medical Problem List and Plan: 1.  Deficits with mobility, endurance, self-care secondary to encephalopathy with debility.  -patient may not shower  -ELOS/Goals: 7-11 days/supervision/mod I  Admit to CIR 2.  Antithrombotics: -PE/anticoagulation:  Pharmaceutical: Other (comment)--on low dose Eliquis  -antiplatelet therapy:  N/A 3. Pain Management: Oxycodone or tramadol prn 4. Mood: LCSW to follow for evaluation and support.   -antipsychotic agents: N/A 5. Neuropsych: This patient is not fully capable of making decisions on his own behalf. 6. Skin/Wound Care: Dry dressing to bilateral feet.   Monitor stoma for healing 7. Fluids/Electrolytes/Nutrition: Strict I/O.    Daily weights. 8. A fib/NSVT: On amiodarone and metoprolol for rate control.    Monitor with increased activity. 9.  ESRD: HD MWF at the end of the day to help with activity tolerance.  10. Pericardial effusion: Has been on colchicine since 1/19 and has been weaned to prednisone 10 mg daily. Last echo 1/29 shows decrease to small effusion. 11. Hypotension: Continue midodrine 10 mg tid.  Monitor with increased exertion. 12. Dysphagia: Tolerating Nephro with Reglan 5 mg tid to help with residuals.  S/p PEG  Advance diet as tolerated. 13. Dry gangrene bilateral feet: Needs to wear shoes for protection. Dry dressing. Maintain adequate nutritional status.  Monitor demarcation/healing. 14. Anxiety/depression: On Zoloft 25 mg/day with klonopin tid.  Team support.   Bary Leriche, PA-C 02/03/2020  I have personally performed a face to face diagnostic evaluation, including, but not limited to relevant history and physical exam findings, of this patient and developed relevant assessment and plan.  Additionally, I have reviewed and concur with the physician assistant's documentation above.  Delice Lesch, MD, ABPMR  The patient's status has not changed. The original post admission physician evaluation remains appropriate, and any changes from the pre-admission screening or documentation from the acute chart are noted above.   Delice Lesch, MD, ABPMR

## 2020-02-03 NOTE — Progress Notes (Signed)
Pulmonary [place]   PULMONARY CRITICAL CARE SERVICE  PROGRESS NOTE  Date of Service: 02/03/2020  [name] [name]  NUU:[id number]  DOB: 1966/10/20   DOA: 12/24/2019  Referring Physician: Merton Border, MD  HPI: [name] [name] is a 54 y.o. male seen for follow up of Acute on Chronic Respiratory Failure.  Doing very well patient was decannulated right now is on room air scheduled for discharge to rehab facility  Medications: Reviewed on Rounds  Physical Exam:  Vitals: Temperature is 98.4 pulse 87 respiratory 18 blood pressure is 117/80 saturations 97%  Ventilator Settings decannulated on room air  . General: Comfortable at this time . Eyes: Grossly normal lids, irises & conjunctiva . ENT: grossly tongue is normal . Neck: no obvious mass . Cardiovascular: S1 S2 normal no gallop . Respiratory: No rhonchi no rales are noted . Abdomen: soft . Skin: no rash seen on limited exam . Musculoskeletal: not rigid . Psychiatric:unable to assess . Neurologic: no seizure no involuntary movements         Lab Data:   Basic Metabolic Panel: Recent Labs  Lab 01/27/20 1336 01/28/20 0707 01/30/20 0810 02/01/20 1138 02/03/20 0653  NA 137  --  135 132* 135  K 4.0  --  3.7 3.4* 4.0  CL 93*  --  90* 91* 93*  CO2 23  --  21* 25 24  GLUCOSE 117*  --  100* 112* 106*  BUN 106*  --  115* 78* 78*  CREATININE 6.31*  --  6.57* 5.94* 5.75*  CALCIUM 9.3  --  9.2 9.0 8.9  MG 2.2 2.0  --   --   --   PHOS 4.3  --  4.5 2.9 2.5    ABG: No results for input(s): PHART, PCO2ART, PO2ART, HCO3, O2SAT in the last 168 hours.  Liver Function Tests: Recent Labs  Lab 01/27/20 1336 01/30/20 0810 02/01/20 1138 02/03/20 0653  ALBUMIN 3.9 3.9 3.7 3.5   No results for input(s): LIPASE, AMYLASE in the last 168 hours. No results for input(s): AMMONIA in the last 168 hours.  CBC: Recent Labs  Lab 01/28/20 0707 01/30/20 0810 02/01/20 1138 02/03/20 0653  WBC  11.6* 10.0 9.7 8.2  HGB 12.6* 12.5* 12.1* 12.3*  HCT 41.9 40.0 39.2 40.3  MCV 92.5 87.5 89.7 89.2  PLT 102* 142* 128* 114*    Cardiac Enzymes: No results for input(s): CKTOTAL, CKMB, CKMBINDEX, TROPONINI in the last 168 hours.  BNP (last 3 results) Recent Labs    11/16/19 1356  BNP 962.8*    ProBNP (last 3 results) No results for input(s): PROBNP in the last 8760 hours.  Radiological Exams: No results found.  Assessment/Plan Active Problems:   Acute on chronic respiratory failure with hypoxia (HCC)   End stage renal disease on dialysis (HCC)   Acute on chronic systolic and diastolic heart failure, NYHA class 4 (HCC)   Chronic atrial fibrillation (HCC)   Acute pulmonary embolism without acute cor pulmonale ([place])   1. Acute on chronic respiratory failure with hypoxia plan is to continue with supportive care patient does not require oxygen right now has been doing very well overnight.  He will be transition to rehab acute care rehab 2. End-stage renal disease on hemodialysis followed by nephrology 3. Acute on chronic systolic heart failure monitor fluid status 4. Chronic atrial fibrillation rate controlled 5. Acute pulmonary embolism no change continue with supportive care   I have personally seen and evaluated the patient,  evaluated laboratory and imaging results, formulated the assessment and plan and placed orders. The Patient requires high complexity decision making with multiple systems involvement.  Rounds were done with the Respiratory Therapy Director and Staff therapists and discussed with nursing staff also.  Allyne Gee, MD [facility] Pulmonary Critical Care Medicine Sleep Medicine

## 2020-02-03 NOTE — Progress Notes (Signed)
[name] Arn, MD  Physician  Physical Medicine and Rehabilitation  PMR Pre-admission  Signed  Date of Service:  02/02/2020  9:16 PM      Related encounter: Admission (Current) from 12/24/2019 in [place] all [x] Manual[x] Template[x] Copied  Added by: [x] Cristina Gong, RN[x] [name] Arn, MD  [] Hover for details PMR Admission Coordinator Pre-Admission Assessment   Patient: [name] [name] is an 54 y.o., male MRN: [id number] DOB: [birth year] Height:   Weight:     Insurance Information HMO:     PPO:      PCP:      IPA:      80/20:      OTHER:  PRIMARY: Medicare a and b      Policy#: [id number]      Subscriber: pt Benefits:  Phone #: passport one online     Name: 2/19 Eff. Date: 10/15/2012     Deduct: $1484      Out of Pocket Max: none      Life Max: none CIR: 100%      SNF: 20 full days Outpatient: 80%     Co-Pay: 20% Home Health: 100%      Co-Pay: none DME: 80%     Co-Pay: 20% Providers: pt choice  SECONDARY: Medicaid [place] access      Policy#: [id number]      Subscriber: pt Benefits:  Phone #: passport one online     Name: 2/19 Eff. Date: active    MADQN   The "Data Collection Information Summary" for patients in Inpatient Rehabilitation Facilities with attached "Privacy Act [place] Records" was provided and verbally reviewed with: Patient and Family   Emergency Contact Information         Contact Information     Name Relation Home Work [name] [name], [name] [name] Father     [phone]    [name] [name], [name] [name]     [phone]    [name] [name], [place] Daughter     [phone]    [name] [name] Mother     [phone]         Current Medical History  Patient Admitting Diagnosis: Debility post VDRF after cardiac arrest   History of Present Illness:  [name] [name] is a 54 year old male with history of ESRD--HD MWF, CHF, CAF, anemia of chronic disease, NICM, polysubstance abuse; who was originally  admitted from his HD center on 11/16/2019 with PEA arrest 3 hours into his HD session. He received CPR with ACLS protocol and had ROSC after 15 minutes. Emergent cricothyrotomy performed by EMS due to failed intubation. He was treated with cooling protocol and 2 D echo revealed EF 15-20% with septal flattening c/w RV pressure and volume overload and also found to be in A fib with tracheal aspirate positive for H Flu.  CT chest showed possible subsegmental PE on right and left as well as large left pleural effusion.Marland Kitchen  He required pressors with CRRT, was started on IV heparin and has been treated with multiple rounds of antibiotics.  He had recurrent VF arrest on 02/08 treated with lidocaine and amiodarone gtt.   Dr. Allred/Cards recommended supportive care--patient not a candidate for advanced CHF therapies or ICD due to history of substance abuse as well as poor overall prognosis and no plans for ischemic work up. Cricothyrotomy converted to tracheostomy 12/10 by Dr. Constance Holster on  Hospital course significant for issues with volume overload with hypotension and VDRF.  He was discharged to [facility] on 12/23/2019 for vent wean and medical management.     Pericardial effusion monitored with repeat echo showing moderate size effusion without tamponade and he was treated with a course of colchicine. Gastrostomy tube placed by Dr. Annamaria Boots on 01/25.  He developed neck edema on 01/27 and CT neck showed circumferential mucosal thickening involving oropharynx, supraglottic larynx likely due to acute pharyngitis and with supraglottic airway narrowed to 2 mm at narrowest point also incidental findings of large layering bilateral pleural effusions R>L partially visualized. He was treated with course of Augmentin and steroids. He also underwent thoracocentesis of 250 cc ambler fluid from right lung on 1/28.  He continues to be NPO with tube feeds ongoing but has had difficulty tolerating this due to persistent abdominal distension as well  as high residuals. Abdominal ultrasound showed minimal ascites and multiple KUB's have been negative for ileus. His tube feed slowly increased to 40 cc/hr by 02/16 and he is tolerating this without discomfort. He was weaned to [facility] 02/09, capping trials were started on 02/15 and he was de cannulated without difficulty on 02/17. Anxiety issues with reports of panic disorder managed with scheduled Klonopin and ativan prn. Seroquel discontinued due to concern of EPS and on cogentin .   Patient's medical record from [facility]  has been reviewed by the rehabilitation admission coordinator and physician.   Past Medical History      Past Medical History:  Diagnosis Date  . Abdominal distension 08/10/2018  . Acute on chronic respiratory failure with hypoxia ([place])    . Acute on chronic systolic and diastolic heart failure, NYHA class 4 ([place])    . Acute pulmonary embolism without acute cor pulmonale (HCC)    . Alcohol abuse 11/26/2010    Qualifier: Diagnosis of  By: [name] MD, Pershing Proud  . Anemia due to blood loss, acute 07/15/2012  . CHF (congestive heart failure) ([place])    . Cholecystitis 04/18/2014  . Chronic atrial fibrillation ([place])    . Cigarette smoker      has currently quit  . Cocaine abuse ([place]) 11/26/2010    Qualifier: Diagnosis of  By: [name] MD, Benjamine Mola  has been over a year  . Combined congestive systolic and diastolic heart failure ([place]) 04/18/2014    A. Echo 8/13: Severe LVH, EF 40-45%, inferoposterior akinesis, grade 2 diastolic dysfunction, moderate LAE, mild RVE, mildly reduced RVSF, mild RAE; cannot rule out R atrial mass-suggest TEE or cardiac MRI  //  B. Echo 3/17: Mild LVH, EF 25-30%, diffuse HK, grade 2 diastolic dysfunction, mild MR, severe LAE,  moderately reduced RVSF, severe RAE, mild TR, moderate PI, PASP 55 mmHg    . Dyspnea    . End stage renal disease on dialysis ([place])    . ESRD (end stage renal disease) on dialysis [facility])      MWF- [place] (05/18/2017)  . Hemodialysis patient ([place])      M,W,F  . Hernia, inguinal, right 08/05/2016  . Hypertension    . 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 Hypocalcemia 07/15/2012  . Medical non-compliance    . Non-ischemic cardiomyopathy ([place])      A. R/L HC 3/17: Normal coronary arteries, moderate pulmonary hypertension (PASP 65 mmHg), elevated LV filling pressures (LVEDP 45 mmHg)   . NSVT (nonsustained ventricular tachycardia) ([place]) 02/22/2016  .  NSVT (nonsustained ventricular tachycardia) ([place]) 02/22/2016  . Pneumonia 11/12/2017  . Polysubstance abuse ([place])    . Prolonged Q-T interval on ECG 05/18/2017  . Prolonged QT interval 05/18/2017  . Restless leg syndrome    . Solitary pulmonary nodule 10/10/2015    See cxr 10/09/2015 - CT rec 10/10/2015 >>>   . Upper airway cough syndrome 10/09/2015    Off ACEi around 1st Oct 2016  - Sinus CT 10/10/2015 >>>        Family History   family history includes Diabetes in his mother; Hypertension in his father and mother; Renal Disease in his mother.   Prior Rehab/Hospitalizations Has the patient had prior rehab or hospitalizations prior to admission? Yes   Has the patient had major surgery during 100 days prior to admission? Yes              Current Medications See MAR   Patients Current Diet:     Diet Order                      Diet NPO time specified  Diet effective midnight                 NPO except ice chips. MBS pending on admit 02/03/2020   Precautions / Restrictions   Fall precautions   Has the patient had 2 or more falls or a fall with injury in the past year? No   Prior Activity Level  Independent with mobility and ADLS pta; did not drive. Disabled Outpatient Hemodialysis MWF [facility]. Dad, [name] transported to outpatient dialysis   Prior Functional Level Self Care: Did the patient need  help bathing, dressing, using the toilet or eating? Independent   Indoor Mobility: Did the patient need assistance with walking from room to room (with or without device)? Independent   Stairs: Did the patient need assistance with internal or external stairs (with or without device)? Independent   Functional Cognition: Did the patient need help planning regular tasks such as shopping or remembering to take medications? Independent   Home Assistive Devices / Equipment  None   Prior Device Use: Indicate devices/aids used by the patient prior to current illness, exacerbation or injury? None of the above     Prior Functional Level Current Functional Level  Bed Mobility    Independent Min guard to edge of bed  Transfers    Independent    Min guard    Mobility - Walk/Wheelchair    Independent    Min guard assist with RW; min to mod assist without AD    Upper Body Dressing    Independent    Min assist    Lower Body Dressing    Independent    Mod assist    Grooming    Independent    supervision    Eating/Drinking   Independent  NPO; ice chips only; supervision    Toilet Transfer    Independent    Min guard to min    Bladder Continence     continent    continent    Bowel Management    Continent    continent    Stair Climbing    Independent    not attempted    Communication    Independent    supervision. De cannulated 2/18    Memory    Intact  decreased safety awareness and not aware of deficits    Driving     did not drive  Special needs/care consideration BiPAP/CPAP  CPM  Continuous Drip IV  Dialysis MWF [facility] center pta; Mom Peter Congo is at [place] dialysis [facility]; I contacted Garth Bigness, renal navigator to request per family to have both patient and his Mom set up for [place] dialysis center. Pt's Dad, [name] [name] provides transportation to OP dialysis for both [employer]  Oxygen room air Special Bed  Trach Size decannulation 02/02/2020 Wound  Vac  Skin right toes #1 and #2 with ischemic changes; left foot and toes with ischemic changes ; WOC at [place] following Has PEG and has been NPO to date. MBS penidng on admission. Bowel mgmt: continent Bladder mgmt:  continent Diabetic mgmt:  Behavioral consideration anxiety issues with reports of panic disorder Chemo/radiation  Designated visitor is his [place]. Has a son and daughter who are involved only by phone. They do not assist with his physical care pta for he did not need any assist besides transportation to OP dialysis    Previous Home Environment   One level home with 2 step entry with rails. Tub shower combo. He lives with his parents, [name] [name] and Peter Congo. Mom is also on OP dialysis. [name], Dad provides transportation to dialysis. He has son and daughter but they do not assist in his care.   Discharge Living Setting  To return home to live with his parents as before admit   Social/Family/Support Systems  Lives with his parents [name] [name] an Peter Congo. Peter Congo also on OP dialysis. Patient did not drive, but walked a lot pta. Son and daughter not involved in his actual day to day care pta.   Goals/Additional Needs  Mod I to supervision with PT, OT, and SLP ELOS 7 to 14 days   Decrease burden of Care through IP rehab admission:    Possible need for SNF placement upon discharge:    Patient Condition: I have reviewed medical records from [place] , spoken with CSW, and patient, daughter and family member Dad and Mom. I met with patient at the bedside for inpatient rehabilitation assessment.  Patient will benefit from ongoing PT, OT and SLP, can actively participate in 3 hours of therapy a day 5 days of the week, and can make measurable gains during the admission.  Patient will also benefit from the coordinated team approach during an Inpatient Acute Rehabilitation admission.  The patient will receive intensive therapy as well as Rehabilitation physician, nursing, social  worker, and care management interventions.  Due to bladder management, bowel management, safety, skin/wound care, disease management, medication administration, pain management and patient education the patient requires 24 hour a day rehabilitation nursing.  The patient is currently min guard  Mobility and to min to mod assist with adls .  Discharge setting and therapy post discharge at home with home health is anticipated.  Patient has agreed to participate in the Acute Inpatient Rehabilitation Program and will admit today.   Preadmission Screen Completed By:  Cleatrice Burke, 02/02/2020 9:16 PM ______________________________________________________________________   Discussed status with Dr. Posey Pronto  on  02/03/2020 at 1135 and received approval for admission today.   Admission Coordinator:  Cleatrice Burke, RN, time  0626 Date  02/03/2020    Assessment/Plan: Diagnosis: CIM 1. Does the need for close, 24 hr/day Medical supervision in concert with the patient's rehab needs make it unreasonable for this patient to be served in a less intensive setting? Yes 2. Co-Morbidities requiring supervision/potential complications: ESRD--HD MWF, CHF, CAF, anemia of chronic disease, NICM,  polysubstance abuse, dysphagia s/p PEG 3. Due to safety, skin/wound care, disease management, medication administration, pain management and patient education, does the patient require 24 hr/day rehab nursing? Yes 4. Does the patient require coordinated care of a physician, rehab nurse, PT, OT, and SLP to address physical and functional deficits in the context of the above medical diagnosis(es)? Yes Addressing deficits in the following areas: balance, endurance, locomotion, strength, transferring, bathing, dressing, toileting, swallowing and psychosocial support 5. Can the patient actively participate in an intensive therapy program of at least 3 hrs of therapy 5 days a week? Yes 6. The potential for patient to make  measurable gains while on inpatient rehab is excellent 7. Anticipated functional outcomes upon discharge from inpatient rehab: modified independent and supervision PT, modified independent and supervision OT, modified independent SLP 8. Estimated rehab length of stay to reach the above functional goals is: 7-11 days. 9. Anticipated discharge destination: Home 10. Overall Rehab/Functional Prognosis: good   MD Signature Delice Lesch, MD, ABPMR        Revision History

## 2020-02-04 ENCOUNTER — Other Ambulatory Visit: Payer: Self-pay

## 2020-02-04 ENCOUNTER — Inpatient Hospital Stay (HOSPITAL_COMMUNITY): Payer: Medicare Other

## 2020-02-04 ENCOUNTER — Inpatient Hospital Stay (HOSPITAL_COMMUNITY): Payer: Medicare Other | Admitting: Speech Pathology

## 2020-02-04 ENCOUNTER — Inpatient Hospital Stay (HOSPITAL_COMMUNITY): Payer: Medicare Other | Admitting: Physical Therapy

## 2020-02-04 DIAGNOSIS — R1310 Dysphagia, unspecified: Secondary | ICD-10-CM

## 2020-02-04 DIAGNOSIS — Z992 Dependence on renal dialysis: Secondary | ICD-10-CM

## 2020-02-04 DIAGNOSIS — G9340 Encephalopathy, unspecified: Secondary | ICD-10-CM

## 2020-02-04 DIAGNOSIS — N186 End stage renal disease: Secondary | ICD-10-CM

## 2020-02-04 DIAGNOSIS — I953 Hypotension of hemodialysis: Secondary | ICD-10-CM

## 2020-02-04 DIAGNOSIS — R400 Somnolence: Secondary | ICD-10-CM

## 2020-02-04 DIAGNOSIS — G479 Sleep disorder, unspecified: Secondary | ICD-10-CM

## 2020-02-04 DIAGNOSIS — F411 Generalized anxiety disorder: Secondary | ICD-10-CM

## 2020-02-04 DIAGNOSIS — R5383 Other fatigue: Secondary | ICD-10-CM

## 2020-02-04 DIAGNOSIS — R5381 Other malaise: Principal | ICD-10-CM

## 2020-02-04 LAB — COMPREHENSIVE METABOLIC PANEL
ALT: 26 U/L (ref 0–44)
AST: 23 U/L (ref 15–41)
Albumin: 3.9 g/dL (ref 3.5–5.0)
Alkaline Phosphatase: 119 U/L (ref 38–126)
Anion gap: 16 — ABNORMAL HIGH (ref 5–15)
BUN: 63 mg/dL — ABNORMAL HIGH (ref 6–20)
CO2: 27 mmol/L (ref 22–32)
Calcium: 8.9 mg/dL (ref 8.9–10.3)
Chloride: 91 mmol/L — ABNORMAL LOW (ref 98–111)
Creatinine, Ser: 4.59 mg/dL — ABNORMAL HIGH (ref 0.61–1.24)
GFR calc Af Amer: 16 mL/min — ABNORMAL LOW (ref >60)
GFR calc non Af Amer: 14 mL/min — ABNORMAL LOW (ref >60)
Glucose, Bld: 103 mg/dL — ABNORMAL HIGH (ref 70–99)
Potassium: 4.1 mmol/L (ref 3.5–5.1)
Sodium: 134 mmol/L — ABNORMAL LOW (ref 135–145)
Total Bilirubin: 0.8 mg/dL (ref 0.3–1.2)
Total Protein: 7.2 g/dL (ref 6.5–8.1)

## 2020-02-04 MED ORDER — CLONAZEPAM 0.5 MG PO TABS
0.5000 mg | ORAL_TABLET | Freq: Once | ORAL | Status: AC
  Administered 2020-02-04: 0.5 mg via ORAL
  Filled 2020-02-04 (×2): qty 1

## 2020-02-04 MED ORDER — NON FORMULARY: 1.5000 mg | Freq: Every day | Status: DC

## 2020-02-04 MED ORDER — NEPRO/CARBSTEADY PO LIQD
1000.0000 mL | ORAL | Status: DC
  Administered 2020-02-06: 1000 mL
  Filled 2020-02-04 (×3): qty 1000

## 2020-02-04 MED ORDER — NEPRO/CARBSTEADY PO LIQD
237.0000 mL | Freq: Two times a day (BID) | ORAL | Status: DC
  Administered 2020-02-04 – 2020-02-14 (×12): 237 mL via ORAL

## 2020-02-04 NOTE — Evaluation (Signed)
Occupational Therapy Assessment and Plan  Patient Details  Name: [name] [name] MRN: [id number] Date of Birth: [birth year]  OT Diagnosis: abnormal posture, cognitive deficits and muscle weakness (generalized) Rehab Potential:   ELOS: 7-10   Today's Date: 02/04/2020 OT Individual Time: 0800-0900 OT Individual Time Calculation (min): 60 min     Problem List:  Patient Active Problem List   Diagnosis Date Noted  . Encephalopathy   . Debility   . ESRD on dialysis ([place])   . Dry gangrene ([place])   . Hypotension   . S/P percutaneous endoscopic gastrostomy (PEG) tube placement ([place])   . Pericardial effusion   . Atrial fibrillation ([place])   . Acute on chronic respiratory failure with hypoxia ([place])   . End stage renal disease on dialysis ([place])   . Acute on chronic systolic and diastolic heart failure, NYHA class 4 ([place])   . Chronic atrial fibrillation ([place])   . Acute pulmonary embolism without acute cor pulmonale (HCC)   . Tracheostomy status ([place])   . Adult failure to thrive   . Ventilator dependent ([place])   . Chronic respiratory failure with hypoxia ([place])   . Anoxic encephalopathy ([place])   . Tracheostomy in place [facility])   . Endotracheally intubated   . Palliative care by specialist   . Pressure injury of skin 11/19/2019  . Acute respiratory failure ([place])   . ESRD on hemodialysis ([place])   . Cardiac arrest ([place]) 11/16/2019  . Paroxysmal atrial fibrillation (HCC)   . Superficial venous thrombosis of right arm 11/05/2018  . Nonischemic cardiomyopathy ([place]) 11/03/2018  . Essential hypertension 11/03/2018  . ESRD needing dialysis ([place]) 10/26/2018  . Prolonged Q-T interval on ECG 05/18/2017  . Hernia, inguinal, right 08/05/2016  . Acute on chronic systolic CHF (congestive heart failure) ([place])   . Solitary pulmonary nodule 10/10/2015  . Chronic combined systolic and diastolic heart failure ([place]) 04/18/2014  . Anemia due to blood loss, acute 07/15/2012  . Hypocalcemia 07/15/2012  .  Cigarette smoker 11/26/2010    Past Medical History:  Past Medical History:  Diagnosis Date  . Abdominal distension 08/10/2018  . Acute on chronic respiratory failure with hypoxia ([place])   . Acute on chronic systolic and diastolic heart failure, NYHA class 4 ([place])   . Acute pulmonary embolism without acute cor pulmonale (HCC)   . Alcohol abuse 11/26/2010   Qualifier: Diagnosis of  By: [name] MD, Pershing Proud  . Anemia due to blood loss, acute 07/15/2012  . CHF (congestive heart failure) ([place])   . Cholecystitis 04/18/2014  . Chronic atrial fibrillation ([place])   . Cigarette smoker    has currently quit  . Cocaine abuse ([place]) 11/26/2010   Qualifier: Diagnosis of  By: [name] MD, Benjamine Mola  has been over a year  . Combined congestive systolic and diastolic heart failure ([place]) 04/18/2014   A. Echo 8/13: Severe LVH, EF 40-45%, inferoposterior akinesis, grade 2 diastolic dysfunction, moderate LAE, mild RVE, mildly reduced RVSF, mild RAE; cannot rule out R atrial mass-suggest TEE or cardiac MRI  //  B. Echo 3/17: Mild LVH, EF 25-30%, diffuse HK, grade 2 diastolic dysfunction, mild MR, severe LAE,  moderately reduced RVSF, severe RAE, mild TR, moderate PI, PASP 55 mmHg    . Dyspnea   . End stage renal disease on dialysis ([place])   . ESRD (end stage renal disease) on dialysis [facility])    MWF- [place] (05/18/2017)  . Hemodialysis patient ([place])    M,W,F  . Hernia, inguinal, right  08/05/2016  . Hypertension   . 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 Hypocalcemia 07/15/2012  . Medical non-compliance   . Non-ischemic cardiomyopathy ([place])    A. R/L HC 3/17: Normal coronary arteries, moderate pulmonary hypertension (PASP 65 mmHg), elevated LV filling pressures (LVEDP 45 mmHg)   . NSVT (nonsustained ventricular tachycardia) ([place]) 02/22/2016  . NSVT (nonsustained ventricular  tachycardia) ([place]) 02/22/2016  . Pneumonia 11/12/2017  . Polysubstance abuse ([place])   . Prolonged Q-T interval on ECG 05/18/2017  . Prolonged QT interval 05/18/2017  . Restless leg syndrome   . Solitary pulmonary nodule 10/10/2015   See cxr 10/09/2015 - CT rec 10/10/2015 >>>   . Upper airway cough syndrome 10/09/2015   Off ACEi around 1st Oct 2016  - Sinus CT 10/10/2015 >>>     Past Surgical History:  Past Surgical History:  Procedure Laterality Date  . [place] TRANSPOSITION  01/19/2013   Procedure: BASCILIC VEIN TRANSPOSITION;  Surgeon: Conrad Willis, MD;  Location: [place];  Service: Vascular;  Laterality: Left;  left 2nd stage basilic vein transposition  . CARDIAC CATHETERIZATION N/A 02/25/2016   Procedure: Right/Left Heart Cath and Coronary Angiography;  Surgeon: Burnell Blanks, MD;  Location: [place] CV LAB;  Service: Cardiovascular;  Laterality: N/A;  . INGUINAL HERNIA REPAIR Right 08/05/2016   Procedure: RIGHT INGUINAL HERNIA REPAIR WITH MESH;  Surgeon: Donnie Mesa, MD;  Location: [place];  Service: General;  Laterality: Right;  . INSERTION OF DIALYSIS CATHETER  07/17/2012   Procedure: INSERTION OF DIALYSIS CATHETER;  Surgeon: Conrad Hayti, MD;  Location: [place];  Service: Vascular;  Laterality: Right;  right internal jugular  . INSERTION OF MESH Right 08/05/2016   Procedure: INSERTION OF MESH;  Surgeon: Donnie Mesa, MD;  Location: [place];  Service: General;  Laterality: Right;  . IR GASTROSTOMY TUBE MOD SED  01/09/2020  . IR THORACENTESIS ASP PLEURAL SPACE W/IMG GUIDE  01/12/2020  . REVISION OF ARTERIOVENOUS GORETEX GRAFT Left 2/77/8242   Procedure: PLICATION OF ARTERIOVENOUS FISTULA LEFT ARM;  Surgeon: Elam Dutch, MD;  Location: [place];  Service: Vascular;  Laterality: Left;  . REVISON OF ARTERIOVENOUS FISTULA Left 01/06/2017   Procedure: REVISON OF ARTERIOVENOUS FISTULA;  Surgeon: Conrad Duncan, MD;  Location: [place];  Service: Vascular;  Laterality: Left;  . REVISON OF  ARTERIOVENOUS FISTULA Left 10/27/2018   Procedure: REVISION PLICATION OF ARTERIOVENOUS FISTULA  LEFT UPPER ARM;  Surgeon: Waynetta Sandy, MD;  Location: [place];  Service: Vascular;  Laterality: Left;  . TRACHEOSTOMY TUBE PLACEMENT N/A 11/24/2019   Procedure: TRACHEOSTOMY;  Surgeon: Izora Gala, MD;  Location: [facility];  Service: ENT;  Laterality: N/A;  . UMBILICAL HERNIA REPAIR N/A 08/05/2016   Procedure: [place];  Surgeon: Donnie Mesa, MD;  Location: [place];  Service: General;  Laterality: N/A;  . VENOGRAM N/A 08/09/2012   Procedure: VENOGRAM;  Surgeon: Conrad [place] Sterling, MD;  [facility] C[facility] LAB;  Service: Cardiovascular;  Laterality: N/A;    Assessment & Plan Clinical Impression: Rossie Bretado is a 54 year old male with history of ESRD--HD MWF, CHF, CAF, anemia of chronic disease, NICM, polysubstance abuse; who was originally admitted from his HD center with PEA arrest 3 hours into his HD session.  History taken from chart review due to behavior/cognition.  He received CPR with ACLS  protocol and had ROSC after 15 minutes. Emergent cricothyrotomy performed by EMS due to failed intubation. He was treated with cooling protocol.  Echocardiogram showed ejection fraction of 15-20% with septal flattening c/w RV pressure and volume overload and also found to be in A fib with tracheal aspirate positive for H Flu.  CT chest showed possible subsegmental PE on right and left as well as large left pleural effusion.Marland Kitchen  He required vasopressors with CRRT and was started on IV heparin and has been treated with multiple rounds of antibiotics.  He had recurrent VF arrest on 01/23/2020 and was treated with lidocaine and amiodarone gtt.   Dr. Allred/Cards recommended supportive care--patient not a candidate for advanced CHF therapies or ICD due to history of substance abuse as well as poor overall prognosis and no plans for ischemic work up. Cricothyrotomy converted to tracheostomy 11/24/2019  by ENT.  Hospital course further complicated by volume overload with volume overload with hypotension and VDRF. He was discharged to [facility] on 12/23/2019 for vent wean and medical management.   Pericardial effusion monitored with repeat echo showing moderate size effusion without tamponade and he was treated with colchicine.  Gastrostomy tube was placed on 01/09/2020 by Dr. Annamaria Boots.  He developed neck edema on 01/27 and CT neck showed circumferential mucosal thickening involving oropharynx, supraglottic larynx likely due to acute pharyngitis and with supraglottic airway narrowed to 2 mm at narrowest point also incidental findings of large layering bilateral pleural effusions R>L partially visualized. He was treated with course of Augmentin and steroids. He also underwent thoracocentesis of 250 cc ambler fluid from right lung on 1/28.  He continues to be NPO with tube feeds ongoing but has had difficulty tolerating this due to persistent abdominal distension as well as high residuals. Abdominal ultrasound showed minimal ascites and multiple KUBs have been negative for ileus. His tube feed slowly increased to 40 cc/hr by 02/16 and he is tolerating this without discomfort. He was weaned to [facility] 02/09, capping trials were started on 02/15 and he was decannulated without difficulty on 02/01/2020.  Anxiety issues with reports of panic disorder managed with scheduled Klonopin and ativan prn. Seroquel discontinued due to concern of EPS and on cogentin. Patient with cognitive deficits as well as debility due to ABI with multiple medical issues.  Therapy ongoing and CIR recommended due to functional decline. Please see preadmission assessment from earlier today as well.  Patient currently requires min with basic self-care skills secondary to muscle weakness, decreased cardiorespiratoy endurance, decreased attention, decreased awareness, decreased problem solving, decreased safety awareness, decreased memory and delayed processing  and decreased sitting balance, decreased standing balance, decreased postural control and decreased balance strategies.  Prior to hospitalization, patient could complete BADL/IADL with independent .  Patient will benefit from skilled intervention to decrease level of assist with basic self-care skills and increase independence with basic self-care skills prior to discharge home with care partner.  Anticipate patient will require 24 hour supervision and follow up home health.  OT - End of Session Activity Tolerance: Tolerates 10 - 20 min activity with multiple rests Endurance Deficit: Yes OT Assessment Rehab Potential (ACUTE ONLY): Fair OT Barriers to Discharge: Medical stability OT Patient demonstrates impairments in the following area(s): Balance;Cognition;Behavior;Endurance;Motor;Safety;Sensory;Skin Integrity OT Basic ADL's Functional Problem(s): Grooming;Bathing;Dressing;Toileting;Eating OT Transfers Functional Problem(s): Toilet;Tub/Shower OT Plan OT Intensity: Minimum of 1-2 x/day, 45 to 90 minutes OT Frequency: 5 out of 7 days OT Duration/Estimated Length of Stay: 7-10 OT Treatment/Interventions: Balance/vestibular training;Discharge planning;Pain management;Self Care/advanced ADL  retraining;Therapeutic Activities;UE/LE Coordination activities;Visual/perceptual remediation/compensation;Therapeutic Exercise;Skin care/wound managment;Patient/family education;Functional mobility training;Disease mangement/prevention;Cognitive remediation/compensation;Community reintegration;DME/adaptive equipment instruction;Neuromuscular re-education;Psychosocial support;Splinting/orthotics;UE/LE Strength taining/ROM;Wheelchair propulsion/positioning OT Self Feeding Anticipated Outcome(s): S OT Basic Self-Care Anticipated Outcome(s): S OT Toileting Anticipated Outcome(s): S OT Bathroom Transfers Anticipated Outcome(s): S OT Recommendation Recommendations for Other Services: Neuropsych consult Patient  destination: Home Follow Up Recommendations: Home health OT Equipment Recommended: 3 in 1 bedside comode;Tub/shower seat;To be determined   Skilled Therapeutic Intervention 1;1. Pt received EOB lethargic and frequently closing eyes and swaying in all directions but never losing balance. Pt agreeable to bathing/dressing EOB with MIN A overall for standing balance. Pt with improved arousal after bathing UB. Set up grooming EOB. Pt able to cross into figure 4 to don socks/pants, but requires A to don B darco shoes. All bathing and dressing completed on RA, but O2 87% after dressing.  Pt ambulates with RW on 2L O2 with sats remaining >95% to [facility] over toilet and to recliner with CGA. Exited session with pt seated in recliner, call light in reach and all needs met, eyes closed.  OT Evaluation Precautions/Restrictions  Restrictions Weight Bearing Restrictions: No General Chart Reviewed: Yes Family/Caregiver Present: No Vital Signs Therapy Vitals Pulse Rate: (!) 104 BP: 100/80 Oxygen Therapy SpO2: 92 % Pain Pain Assessment Pain Scale: Faces Pain Score: 0-No pain Faces Pain Scale: No hurt Home Living/Prior Functioning Home Living Available Help at Discharge: Family, Available 24 hours/day Type of Home: House Home Access: Stairs to enter [employer] of Steps: 2 Entrance Stairs-Rails: Can reach both Home Layout: One level Bathroom Shower/Tub: Government social research officer Accessibility: Yes Additional Comments: needs TTB or shower seat IADL History Homemaking Responsibilities: No Prior Function Level of Independence: Independent with basic ADLs, Independent with homemaking with ambulation  Able to Take Stairs?: Yes Driving: No ADL   Vision Baseline Vision/History: No visual deficits Vision Assessment?: No apparent visual deficits Perception  Perception: Within Functional Limits Praxis Praxis: Intact Cognition Overall Cognitive Status:  Impaired/Different from baseline Arousal/Alertness: Lethargic Orientation Level: Place;Situation;Person Person: Oriented Place: Oriented Situation: Oriented Year: 2021 Month: February Day of Week: Incorrect Memory: Impaired Immediate Memory Recall: Sock;Blue;Bed Memory Recall Sock: Not able to recall Memory Recall Bed: Without Cue Awareness: Impaired Problem Solving: Impaired Safety/Judgment: Impaired Sensation Sensation Light Touch: Impaired by gross assessment Coordination Gross Motor Movements are Fluid and Coordinated: No Fine Motor Movements are Fluid and Coordinated: No Motor  Motor Motor: Abnormal postural alignment and control Mobility  Transfers Sit to Stand: Minimal Assistance - Patient > 75%;Moderate Assistance - Patient 50-74% Stand to Sit: Minimal Assistance - Patient > 75%  Trunk/Postural Assessment  Cervical Assessment Cervical Assessment: (head forward) Thoracic Assessment Thoracic Assessment: (rounded shoulders) Lumbar Assessment Lumbar Assessment: (post pelvic tilt) Postural Control Postural Control: Deficits on evaluation Righting Reactions: delayed Protective Responses: delayed  Balance Balance Balance Assessed: Yes Dynamic Sitting Balance Dynamic Sitting - Level of Assistance: 4: Min assist Dynamic Sitting Balance - Compensations: decreased arousal increased needs for A at EOB fading to S when Static Standing Balance Static Standing - Level of Assistance: 4: Min assist Static Standing - Comment/# of Minutes: dressing Extremity/Trunk Assessment RUE Assessment RUE Assessment: Exceptions to [facility] General Strength Comments: generalized weakness LUE Assessment LUE Assessment: Exceptions to [facility] General Strength Comments: generalized weakness     Refer to Care Plan for Long Term Goals  Recommendations for other services: Neuropsych   Discharge Criteria: Patient will be discharged from OT if patient refuses treatment 3 consecutive times  without medical reason, if treatment goals not met, if there is a change in medical status, if patient makes no progress towards goals or if patient is discharged from hospital.  The above assessment, treatment plan, treatment alternatives and goals were discussed and mutually agreed upon: by patient  Tonny Branch 02/04/2020, 8:56 AM

## 2020-02-04 NOTE — Evaluation (Addendum)
Speech Language Pathology Assessment and Plan  Patient Details  Name: [name] [name] MRN: [id number] Date of Birth: [birth year]  SLP Diagnosis: Cognitive Impairments;Dysphagia;Speech and Language deficits  Rehab Potential: Fair ELOS: 10-14 days    Today's Date: 02/04/2020 SLP Individual Time: 7106-2694 SLP Individual Time Calculation (min): 55 min   Problem List:  Patient Active Problem List   Diagnosis Date Noted  . Lethargy   . Sleep disturbance   . Generalized anxiety disorder   . Dysphagia   . Somnolence   . Encephalopathy   . Debility   . ESRD on dialysis ([place])   . Dry gangrene ([place])   . Hypotension   . S/P percutaneous endoscopic gastrostomy (PEG) tube placement ([place])   . Pericardial effusion   . Atrial fibrillation ([place])   . Acute on chronic respiratory failure with hypoxia ([place])   . End stage renal disease on dialysis ([place])   . Acute on chronic systolic and diastolic heart failure, NYHA class 4 ([place])   . Chronic atrial fibrillation ([place])   . Acute pulmonary embolism without acute cor pulmonale (HCC)   . Tracheostomy status ([place])   . Adult failure to thrive   . Ventilator dependent ([place])   . Chronic respiratory failure with hypoxia ([place])   . Anoxic encephalopathy ([place])   . Tracheostomy in place [facility])   . Endotracheally intubated   . Palliative care by specialist   . Pressure injury of skin 11/19/2019  . Acute respiratory failure ([place])   . ESRD on hemodialysis ([place])   . Cardiac arrest ([place]) 11/16/2019  . Paroxysmal atrial fibrillation (HCC)   . Superficial venous thrombosis of right arm 11/05/2018  . Nonischemic cardiomyopathy ([place]) 11/03/2018  . Essential hypertension 11/03/2018  . ESRD needing dialysis ([place]) 10/26/2018  . Prolonged Q-T interval on ECG 05/18/2017  . Hernia, inguinal, right 08/05/2016  . Acute on chronic systolic CHF (congestive heart failure) ([place])   . Solitary pulmonary nodule 10/10/2015  . Chronic combined systolic and diastolic heart  failure ([place]) 04/18/2014  . Anemia due to blood loss, acute 07/15/2012  . Hypocalcemia 07/15/2012  . Cigarette smoker 11/26/2010   Past Medical History:  Past Medical History:  Diagnosis Date  . Abdominal distension 08/10/2018  . Acute on chronic respiratory failure with hypoxia ([place])   . Acute on chronic systolic and diastolic heart failure, NYHA class 4 ([place])   . Acute pulmonary embolism without acute cor pulmonale (HCC)   . Alcohol abuse 11/26/2010   Qualifier: Diagnosis of  By: [name] MD, Pershing Proud  . Anemia due to blood loss, acute 07/15/2012  . CHF (congestive heart failure) ([place])   . Cholecystitis 04/18/2014  . Chronic atrial fibrillation ([place])   . Cigarette smoker    has currently quit  . Cocaine abuse ([place]) 11/26/2010   Qualifier: Diagnosis of  By: [name] MD, Benjamine Mola  has been over a year  . Combined congestive systolic and diastolic heart failure ([place]) 04/18/2014   A. Echo 8/13: Severe LVH, EF 40-45%, inferoposterior akinesis, grade 2 diastolic dysfunction, moderate LAE, mild RVE, mildly reduced RVSF, mild RAE; cannot rule out R atrial mass-suggest TEE or cardiac MRI  //  B. Echo 3/17: Mild LVH, EF 25-30%, diffuse HK, grade 2 diastolic dysfunction, mild MR, severe LAE,  moderately reduced RVSF, severe RAE, mild TR, moderate PI, PASP 55 mmHg    . Dyspnea   . End stage renal disease on dialysis ([place])   . ESRD (end stage renal disease) on dialysis ([place])  MWF- [employer] (05/18/2017)  . Hemodialysis patient ([place])    M,W,F  . Hernia, inguinal, right 08/05/2016  . Hypertension   . 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 Hypocalcemia 07/15/2012  . Medical non-compliance   . Non-ischemic cardiomyopathy ([place])    A. R/L HC 3/17: Normal coronary arteries, moderate pulmonary hypertension (PASP 65 mmHg), elevated LV filling pressures (LVEDP 45 mmHg)    . NSVT (nonsustained ventricular tachycardia) ([place]) 02/22/2016  . NSVT (nonsustained ventricular tachycardia) ([place]) 02/22/2016  . Pneumonia 11/12/2017  . Polysubstance abuse ([place])   . Prolonged Q-T interval on ECG 05/18/2017  . Prolonged QT interval 05/18/2017  . Restless leg syndrome   . Solitary pulmonary nodule 10/10/2015   See cxr 10/09/2015 - CT rec 10/10/2015 >>>   . Upper airway cough syndrome 10/09/2015   Off ACEi around 1st Oct 2016  - Sinus CT 10/10/2015 >>>     Past Surgical History:  Past Surgical History:  Procedure Laterality Date  . [place] TRANSPOSITION  01/19/2013   Procedure: BASCILIC VEIN TRANSPOSITION;  Surgeon: Conrad Dimmit, MD;  Location: [facility];  Service: Vascular;  Laterality: Left;  left 2nd stage basilic vein transposition  . CARDIAC CATHETERIZATION N/A 02/25/2016   Procedure: Right/Left Heart Cath and Coronary Angiography;  Surgeon: Burnell Blanks, MD;  Location: [facility] CV LAB;  Service: Cardiovascular;  Laterality: N/A;  . INGUINAL HERNIA REPAIR Right 08/05/2016   Procedure: RIGHT INGUINAL HERNIA REPAIR WITH MESH;  Surgeon: Donnie Mesa, MD;  Location: [facility];  Service: General;  Laterality: Right;  . INSERTION OF DIALYSIS CATHETER  07/17/2012   Procedure: INSERTION OF DIALYSIS CATHETER;  Surgeon: Conrad Wilton, MD;  Location: [facility];  Service: Vascular;  Laterality: Right;  right internal jugular  . INSERTION OF MESH Right 08/05/2016   Procedure: INSERTION OF MESH;  Surgeon: Donnie Mesa, MD;  Location: [facility];  Service: General;  Laterality: Right;  . IR GASTROSTOMY TUBE MOD SED  01/09/2020  . IR THORACENTESIS ASP PLEURAL SPACE W/IMG GUIDE  01/12/2020  . REVISION OF ARTERIOVENOUS GORETEX GRAFT Left 6/65/9935   Procedure: PLICATION OF ARTERIOVENOUS FISTULA LEFT ARM;  Surgeon: Elam Dutch, MD;  Location: [facility];  Service: Vascular;  Laterality: Left;  . REVISON OF ARTERIOVENOUS FISTULA Left 01/06/2017   Procedure: REVISON OF ARTERIOVENOUS FISTULA;   Surgeon: Conrad Muhlenberg, MD;  Location: [facility];  Service: Vascular;  Laterality: Left;  . REVISON OF ARTERIOVENOUS FISTULA Left 10/27/2018   Procedure: REVISION PLICATION OF ARTERIOVENOUS FISTULA  LEFT UPPER ARM;  Surgeon: Waynetta Sandy, MD;  Location: [facility];  Service: Vascular;  Laterality: Left;  . TRACHEOSTOMY TUBE PLACEMENT N/A 11/24/2019   Procedure: TRACHEOSTOMY;  Surgeon: Izora Gala, MD;  Location: [facility];  Service: ENT;  Laterality: N/A;  . UMBILICAL HERNIA REPAIR N/A 08/05/2016   Procedure: [place];  Surgeon: Donnie Mesa, MD;  Location: [facility];  Service: General;  Laterality: N/A;  . VENOGRAM N/A 08/09/2012   Procedure: VENOGRAM;  Surgeon: Conrad [place], MD;  Location[facility] C[facility];  Service: Cardiovascular;  Laterality: N/A;    Assessment / Plan / Recommendation Clinical Impression Patient is a 54 year old male with history of ESRD--HD MWF, CHF, CAF, anemia of chronic disease, NICM, polysubstance abuse; who was originally admitted from his HD center with PEA arrest 3 hours  into his HD session.  He received CPR with ACLS protocol and had ROSC after 15 minutes. Emergent cricothyrotomy performed by EMS due to failed intubation. He was treated with cooling protocol.  Echocardiogram showed ejection fraction of 15-20% with septal flattening c/w RV pressure and volume overload and also found to be in A fib with tracheal aspirate positive for H Flu. CT chest showed possible subsegmental PE on right and left as well as large left pleural effusion. Dr. Allred/Cards recommended supportive care--patient not a candidate for advanced CHF therapies or ICD due to history of substance abuse as well as poor overall prognosis and no plans for ischemic work up. Cricothyrotomy converted to tracheostomy 11/24/2019 by ENT.  Hospital course further complicated by volume overload with volume overload with hypotension and VDRF. He was discharged to [facility] on 12/23/2019 for vent wean and  medical management. Gastrostomy tube was placed on 01/09/2020 by Dr. Annamaria Boots.  He developed neck edema on 01/27 and CT neck showed circumferential mucosal thickening involving oropharynx, supraglottic larynx likely due to acute pharyngitis and with supraglottic airway narrowed to 2 mm at narrowest point also incidental findings of large layering bilateral pleural effusions R>L partially visualized. He was treated with course of Augmentin and steroids. He was weaned to [facility] 02/09, capping trials were started on 02/15 and he was decannulated without difficulty on 02/01/2020. Patient with cognitive deficits as well as debility due to ABI with multiple medical issues.  Therapy ongoing and CIR recommended due to functional decline. Patient admitted 02/03/20.   Patient demonstrates moderate cognitive impairments, however, family is not present to confirm baseline level of cognitive functioning. Patient demonstrates impairments in sustained attention, intellectual awareness, functional problem solving and recall which impacts his safety with functional and familiar tasks. Patient demonstrated inconsistent levels of arousal throughout the session with restlessness, impulsivity and low frustration tolerance. Due to prolonged intubation, patient also demonstrates a hoarse vocal quality which can impact overall speech intelligibility. Patient demonstrates prolonged mastication of solid textures which appeare further exacerbated by lethargy, however, no overt s/s of aspiration were noted. Patient demonstrates throat clearing and intermittent explosive coughing episodes with thin liquids despite use of a cup or straw and Max cues for a slow rate/small sips. Overt s/s of aspiration were eliminated with cup sips of nectar-thick liquids. Due to patient's inconsistent level of arousal, cognitive functioning and overall impulsivity, recommend patient downgrade to Dys. 3 textures with nectar-thick liquids via cup with full supervision to  ensure use of swallowing compensatory strategies. Patient would benefit from skilled SLP intervention to maximize his cognitive and swallowing function prior to discharge.    Skilled Therapeutic Interventions          Administered a cognitive-linguistic evaluation and BSE, please see above for details.   SLP Assessment  Patient will need skilled Speech Lanaguage Pathology Services during CIR admission    Recommendations  SLP Diet Recommendations: Dysphagia 3 (Mech soft);Nectar Liquid Administration via: Cup Medication Administration: Whole meds with puree Supervision: Patient able to self feed;Full supervision/cueing for compensatory strategies Compensations: Minimize environmental distractions;Slow rate;Small sips/bites Postural Changes and/or Swallow Maneuvers: Seated upright 90 degrees Oral Care Recommendations: Oral care BID Recommendations for Other Services: Neuropsych consult Patient destination: Home Follow up Recommendations: Home Health SLP;24 hour supervision/assistance Equipment Recommended: To be determined    SLP Frequency 1 to 3 out of 7 days   SLP Duration  SLP Intensity  SLP Treatment/Interventions 7-10 days  Minumum of 1-2 x/day, 30 to 90 minutes  Cognitive remediation/compensation;Dysphagia/aspiration  precaution training;Speech/Language facilitation;Internal/external aids;Cueing hierarchy;Environmental controls;Therapeutic Activities;Patient/family education;Functional tasks    Pain Pain Assessment Pain Score: 0-No pain  Prior Functioning Type of Home: House Available Help at Discharge: Family;Available 24 hours/day  SLP Evaluation Cognition Overall Cognitive Status: Impaired/Different from baseline Arousal/Alertness: Lethargic Orientation Level: Oriented X4 Attention: Sustained Sustained Attention: Impaired Sustained Attention Impairment: Verbal basic;Functional basic Memory: Impaired Memory Impairment: Decreased short term memory;Decreased recall of  new information Decreased Short Term Memory: Verbal basic;Functional basic Immediate Memory Recall: Sock;Blue;Bed Memory Recall Sock: Not able to recall Memory Recall Bed: Without Cue Awareness: Impaired Awareness Impairment: Intellectual impairment Problem Solving: Impaired Problem Solving Impairment: Verbal basic;Functional basic Behaviors: Poor frustration tolerance Safety/Judgment: Impaired  Comprehension Auditory Comprehension Overall Auditory Comprehension: Appears within functional limits for tasks assessed Visual Recognition/Discrimination Discrimination: Not tested Reading Comprehension Reading Status: Not tested Expression Expression Primary Mode of Expression: Verbal Verbal Expression Overall Verbal Expression: Appears within functional limits for tasks assessed Written Expression Dominant Hand: Right Written Expression: Not tested Oral Motor Oral Motor/Sensory Function Overall Oral Motor/Sensory Function: Within functional limits Motor Speech Overall Motor Speech: Impaired Respiration: Impaired Level of Impairment: Word Phonation: Hoarse;Breathy Resonance: Within functional limits Articulation: Within functional limitis Intelligibility: Intelligibility reduced Word: 75-100% accurate Phrase: 75-100% accurate Sentence: 75-100% accurate Motor Planning: Witnin functional limits Effective Techniques: Increased vocal intensity  Bedside Swallowing Assessment General Date of Onset: 01/05/20 Previous Swallow Assessment: MBS on 2/19: Recommended regular textures with thin liuqids Diet Prior to this Study: Regular;Thin liquids Temperature Spikes Noted: No Respiratory Status: Room air History of Recent Intubation: Yes Behavior/Cognition: Requires cueing;Impulsive;Lethargic/Drowsy Oral Cavity - Dentition: Adequate natural dentition Self-Feeding Abilities: Able to feed self Patient Positioning: Upright in chair/Tumbleform Baseline Vocal Quality:  Breathy;Hoarse Volitional Cough: Weak Volitional Swallow: Able to elicit  Ice Chips Ice chips: Not tested Thin Liquid Thin Liquid: Impaired Presentation: Cup;Self Fed;Straw Pharyngeal  Phase Impairments: Suspected delayed Swallow;Throat Clearing - Immediate;Cough - Immediate Nectar Thick Nectar Thick Liquid: Impaired Presentation: Cup;Self Fed Pharyngeal Phase Impairments: Suspected delayed Swallow Honey Thick Honey Thick Liquid: Not tested Puree Puree: Impaired Presentation: Self Fed;Spoon Pharyngeal Phase Impairments: Suspected delayed Swallow Solid Solid: Impaired Presentation: Self Fed;Spoon Oral Phase Impairments: Impaired mastication Oral Phase Functional Implications: Prolonged oral transit;Impaired mastication BSE Assessment Risk for Aspiration Impact on safety and function: Mild aspiration risk;Moderate aspiration risk Other Related Risk Factors: Lethargy;Cognitive impairment;Prolonged intubation;Deconditioning;Decreased respiratory status  Short Term Goals: Week 1: SLP Short Term Goal 1 (Week 1): STGs=LTGs due to ELOS  Refer to Care Plan for Long Term Goals  Recommendations for other services: Neuropsych  Discharge Criteria: Patient will be discharged from SLP if patient refuses treatment 3 consecutive times without medical reason, if treatment goals not met, if there is a change in medical status, if patient makes no progress towards goals or if patient is discharged from hospital.  The above assessment, treatment plan, treatment alternatives and goals were discussed and mutually agreed upon: by patient  Trevia Nop 02/04/2020, 12:14 PM

## 2020-02-04 NOTE — Progress Notes (Signed)
Pt is very anxiety and stating that he cant breath. Pt stating " I am scared, please dont leave ". But asked this writer to hold hand while he set in the chair. Pt quickly fell asleep in chair. This Probation officer let go of hand and tried to leave room, pt woke up and stated "please dont leave", this writer gave pt prn ativan to see if that would help. Pt is not wanting to be alone. This Probation officer encouraged pt that everything would be okay, and that he should try and relax and sleep.

## 2020-02-04 NOTE — Progress Notes (Signed)
[facility] PHYSICAL MEDICINE & REHABILITATION PROGRESS NOTE  Subjective/Complaints: Patient seen sitting up at the edge of his bed this morning, attempting to work with therapy.  Discussed with nursing, patient did not sleep well overnight due to anxiety.  Patient barely unable to maintain sitting balance due to swelling from lethargy.  Discussed cautious progression with therapies.  Discussed diet for with nursing -advanced to regular thins after MBS.  ROS: Limited due to lethargy  Objective: Vital Signs: Blood pressure 100/80, pulse (!) 104, temperature (!) 96.9 F (36.1 C), temperature source Axillary, resp. rate 20, weight 62.6 kg, SpO2 92 %. No results found. Recent Labs    02/01/20 1138 02/03/20 0653  WBC 9.7 8.2  HGB 12.1* 12.3*  HCT 39.2 40.3  PLT 128* 114*   Recent Labs    02/01/20 1138 02/03/20 0653  NA 132* 135  K 3.4* 4.0  CL 91* 93*  CO2 25 24  GLUCOSE 112* 106*  BUN 78* 78*  CREATININE 5.94* 5.75*  CALCIUM 9.0 8.9    Physical Exam: BP 100/80   Pulse (!) 104   Temp (!) 96.9 F (36.1 C) (Axillary)   Resp 20   Wt 62.6 kg   SpO2 92%   BMI 19.52 kg/m  Constitutional: No distress . Vital signs reviewed.  Cachectic. HENT: Normocephalic.  Atraumatic.  + Stoma. Eyes: EOMI. No discharge.  Scleral icterus Cardiovascular: No JVD. Respiratory: Normal effort.  No stridor. GI: Distended.  + PEG. Skin: See above.   Bilateral toes with gangrene Psych: Flat, limited due to lethargy Musc: No edema in extremities.  No tenderness in extremities. Neurological: Extremely lethargic Motor: 3+-4 -/5 throughout (?  Limited due to lethargy) Dysphonia, unchanged  Assessment/Plan: 1. Functional deficits secondary to encephalopathy which require 3+ hours per day of interdisciplinary therapy in a comprehensive inpatient rehab setting.  Physiatrist is providing close team supervision and 24 hour management of active medical problems listed below.  Physiatrist and rehab  team continue to assess barriers to discharge/monitor patient progress toward functional and medical goals  Care Tool:  Bathing              Bathing assist       Upper Body Dressing/Undressing Upper body dressing   What is the patient wearing?: Hospital gown only    Upper body assist      Lower Body Dressing/Undressing Lower body dressing      What is the patient wearing?: Pants     Lower body assist Assist for lower body dressing: Maximal Assistance - Patient 25 - 49%     Toileting Toileting    Toileting assist Assist for toileting: Moderate Assistance - Patient 50 - 74%     Transfers Chair/bed transfer  Transfers assist     Chair/bed transfer assist level: Maximal Assistance - Patient 25 - 49%     Locomotion Ambulation   Ambulation assist              Walk 10 feet activity   Assist           Walk 50 feet activity   Assist           Walk 150 feet activity   Assist           Walk 10 feet on uneven surface  activity   Assist           Wheelchair     Assist  Wheelchair 50 feet with 2 turns activity    Assist            Wheelchair 150 feet activity     Assist            Medical Problem List and Plan: 1.  Deficits with mobility, endurance, self-care secondary to encephalopathy with debility.  Begin CIR evaluations as tolerated 2.  Antithrombotics: -PE/anticoagulation:  Pharmaceutical: Other (comment)--on low dose Eliquis             -antiplatelet therapy:  N/A 3. Pain Management: Oxycodone or tramadol prn 4. Mood: LCSW to follow for evaluation and support.              -antipsychotic agents: N/A 5. Neuropsych: This patient is not fully capable of making decisions on his own behalf. 6. Skin/Wound Care: Dry dressing to bilateral feet.              Monitor stoma for healing 7. Fluids/Electrolytes/Nutrition: Strict I/O.   8. A fib/NSVT: On amiodarone and metoprolol for  rate control.              Monitor with increased activity. 9.  ESRD: HD MWF at the end of the day to help with activity tolerance.  Filed Weights   02/04/20 0440  Weight: 62.6 kg  10. Pericardial effusion: Has been on colchicine since 1/19 and has been weaned to prednisone 10 mg daily. Last echo 1/29 shows decrease to small effusion. 11. Hypotension: Continue midodrine 10 mg tid.             Monitor with increased mobility 12. Dysphagia: Improving  Tolerating Nephro with Reglan 5 mg tid to help with residuals, will consider weaning             S/p PEG             Advanced to regular thins 13. Dry gangrene bilateral feet: Needs to wear shoes for protection. Dry dressing. Maintain adequate nutritional status.  Monitor demarcation/healing. 14. Anxiety/depression: On Zoloft 25 mg/day with klonopin tid.  Team support.  See #15  ECG ordered for prolonged QTC-we will consider Seroquel based on results 15.  Extremely lethargic/AMS  Unable to keep eyes open and swaying when attempting to sit  Secondary to lack of sleep overnight +/- electrolyte abnormalities   See #14  Melatonin started on 2/20  CMP ordered  LOS: 1 days A FACE TO FACE EVALUATION WAS PERFORMED  Ambermarie Honeyman Lorie Phenix 02/04/2020, 10:27 AM

## 2020-02-04 NOTE — Plan of Care (Signed)
  Problem: RH Balance Goal: LTG Patient will maintain dynamic sitting balance (PT) Description: LTG:  Patient will maintain dynamic sitting balance with assistance during mobility activities (PT) Flowsheets (Taken 02/04/2020 1535) LTG: Pt will maintain dynamic sitting balance during mobility activities with:: Independent with assistive device  Goal: LTG Patient will maintain dynamic standing balance (PT) Description: LTG:  Patient will maintain dynamic standing balance with assistance during mobility activities (PT) Flowsheets (Taken 02/04/2020 1535) LTG: Pt will maintain dynamic standing balance during mobility activities with:: Supervision/Verbal cueing   Problem: Sit to Stand Goal: LTG:  Patient will perform sit to stand with assistance level (PT) Description: LTG:  Patient will perform sit to stand with assistance level (PT) Flowsheets (Taken 02/04/2020 1535) LTG: PT will perform sit to stand in preparation for functional mobility with assistance level: Supervision/Verbal cueing   Problem: RH Bed Mobility Goal: LTG Patient will perform bed mobility with assist (PT) Description: LTG: Patient will perform bed mobility with assistance, with/without cues (PT). Flowsheets (Taken 02/04/2020 1535) LTG: Pt will perform bed mobility with assistance level of: Supervision/Verbal cueing   Problem: RH Bed to Chair Transfers Goal: LTG Patient will perform bed/chair transfers w/assist (PT) Description: LTG: Patient will perform bed to chair transfers with assistance (PT). Flowsheets (Taken 02/04/2020 1535) LTG: Pt will perform Bed to Chair Transfers with assistance level: Contact Guard/Touching assist   Problem: RH Car Transfers Goal: LTG Patient will perform car transfers with assist (PT) Description: LTG: Patient will perform car transfers with assistance (PT). Flowsheets (Taken 02/04/2020 1535) LTG: Pt will perform car transfers with assist:: Minimal Assistance - Patient > 75%   Problem: RH  Ambulation Goal: LTG Patient will ambulate in controlled environment (PT) Description: LTG: Patient will ambulate in a controlled environment, # of feet with assistance (PT). Flowsheets (Taken 02/04/2020 1535) LTG: Pt will ambulate in controlled environ  assist needed:: Supervision/Verbal cueing LTG: Ambulation distance in controlled environment: 122ft with LRAD Goal: LTG Patient will ambulate in home environment (PT) Description: LTG: Patient will ambulate in home environment, # of feet with assistance (PT). Flowsheets (Taken 02/04/2020 1535) LTG: Pt will ambulate in home environ  assist needed:: Supervision/Verbal cueing LTG: Ambulation distance in home environment: 72ft with LRAD   Problem: RH Wheelchair Mobility Goal: LTG Patient will propel w/c in controlled environment (PT) Description: LTG: Patient will propel wheelchair in controlled environment, # of feet with assist (PT) Flowsheets (Taken 02/04/2020 1535) LTG: Pt will propel w/c in controlled environ  assist needed:: Supervision/Verbal cueing LTG: Propel w/c distance in controlled environment: 133ft

## 2020-02-04 NOTE — Evaluation (Signed)
Physical Therapy Assessment and Plan  Patient Details  Name: [name] [name] MRN: [id number] Date of Birth: [birth year]  PT Diagnosis: Abnormal posture, Abnormality of gait, Coordination disorder, Difficulty walking, Impaired cognition, Impaired sensation and Muscle weakness Rehab Potential: Good ELOS: 10-14 days   Today's Date: 02/04/2020 PT Individual Time: 1300-1400 PT Individual Time Calculation (min): 60 min    Problem List:  Patient Active Problem List   Diagnosis Date Noted  . Lethargy   . Sleep disturbance   . Generalized anxiety disorder   . Dysphagia   . Somnolence   . Encephalopathy   . Debility   . ESRD on dialysis ([place])   . Dry gangrene ([place])   . Hypotension   . S/P percutaneous endoscopic gastrostomy (PEG) tube placement ([place])   . Pericardial effusion   . Atrial fibrillation ([place])   . Acute on chronic respiratory failure with hypoxia ([place])   . End stage renal disease on dialysis ([place])   . Acute on chronic systolic and diastolic heart failure, NYHA class 4 ([place])   . Chronic atrial fibrillation ([place])   . Acute pulmonary embolism without acute cor pulmonale (HCC)   . Tracheostomy status ([place])   . Adult failure to thrive   . Ventilator dependent ([place])   . Chronic respiratory failure with hypoxia ([place])   . Anoxic encephalopathy ([place])   . Tracheostomy in place [facility])   . Endotracheally intubated   . Palliative care by specialist   . Pressure injury of skin 11/19/2019  . Acute respiratory failure ([place])   . ESRD on hemodialysis ([place])   . Cardiac arrest ([place]) 11/16/2019  . Paroxysmal atrial fibrillation (HCC)   . Superficial venous thrombosis of right arm 11/05/2018  . Nonischemic cardiomyopathy ([place]) 11/03/2018  . Essential hypertension 11/03/2018  . ESRD needing dialysis ([place]) 10/26/2018  . Prolonged Q-T interval on ECG 05/18/2017  . Hernia, inguinal, right 08/05/2016  . Acute on chronic systolic CHF (congestive heart failure) ([place])   . Solitary pulmonary  nodule 10/10/2015  . Chronic combined systolic and diastolic heart failure ([place]) 04/18/2014  . Anemia due to blood loss, acute 07/15/2012  . Hypocalcemia 07/15/2012  . Cigarette smoker 11/26/2010    Past Medical History:  Past Medical History:  Diagnosis Date  . Abdominal distension 08/10/2018  . Acute on chronic respiratory failure with hypoxia ([place])   . Acute on chronic systolic and diastolic heart failure, NYHA class 4 ([place])   . Acute pulmonary embolism without acute cor pulmonale (HCC)   . Alcohol abuse 11/26/2010   Qualifier: Diagnosis of  By: [name] MD, Pershing Proud  . Anemia due to blood loss, acute 07/15/2012  . CHF (congestive heart failure) ([place])   . Cholecystitis 04/18/2014  . Chronic atrial fibrillation ([place])   . Cigarette smoker    has currently quit  . Cocaine abuse ([place]) 11/26/2010   Qualifier: Diagnosis of  By: [name] MD, Benjamine Mola  has been over a year  . Combined congestive systolic and diastolic heart failure ([place]) 04/18/2014   A. Echo 8/13: Severe LVH, EF 40-45%, inferoposterior akinesis, grade 2 diastolic dysfunction, moderate LAE, mild RVE, mildly reduced RVSF, mild RAE; cannot rule out R atrial mass-suggest TEE or cardiac MRI  //  B. Echo 3/17: Mild LVH, EF 25-30%, diffuse HK, grade 2 diastolic dysfunction, mild MR, severe LAE,  moderately reduced RVSF, severe RAE, mild TR, moderate PI, PASP 55 mmHg    . Dyspnea   . End stage renal disease on dialysis ([place])   .  ESRD (end stage renal disease) on dialysis [facility])    MWF- [place] (05/18/2017)  . Hemodialysis patient ([place])    M,W,F  . Hernia, inguinal, right 08/05/2016  . Hypertension   . 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 Hypocalcemia 07/15/2012  . Medical non-compliance   . Non-ischemic cardiomyopathy ([place])    A. R/L HC 3/17: Normal coronary arteries, moderate pulmonary  hypertension (PASP 65 mmHg), elevated LV filling pressures (LVEDP 45 mmHg)   . NSVT (nonsustained ventricular tachycardia) ([place]) 02/22/2016  . NSVT (nonsustained ventricular tachycardia) ([place]) 02/22/2016  . Pneumonia 11/12/2017  . Polysubstance abuse ([place])   . Prolonged Q-T interval on ECG 05/18/2017  . Prolonged QT interval 05/18/2017  . Restless leg syndrome   . Solitary pulmonary nodule 10/10/2015   See cxr 10/09/2015 - CT rec 10/10/2015 >>>   . Upper airway cough syndrome 10/09/2015   Off ACEi around 1st Oct 2016  - Sinus CT 10/10/2015 >>>     Past Surgical History:  Past Surgical History:  Procedure Laterality Date  . [place] TRANSPOSITION  01/19/2013   Procedure: BASCILIC VEIN TRANSPOSITION;  Surgeon: Conrad [place] Grove, MD;  Location: [place];  Service: Vascular;  Laterality: Left;  left 2nd stage basilic vein transposition  . CARDIAC CATHETERIZATION N/A 02/25/2016   Procedure: Right/Left Heart Cath and Coronary Angiography;  Surgeon: Burnell Blanks, MD;  Location: [facility] CV LAB;  Service: Cardiovascular;  Laterality: N/A;  . INGUINAL HERNIA REPAIR Right 08/05/2016   Procedure: RIGHT INGUINAL HERNIA REPAIR WITH MESH;  Surgeon: Donnie Mesa, MD;  Location: [place];  Service: General;  Laterality: Right;  . INSERTION OF DIALYSIS CATHETER  07/17/2012   Procedure: INSERTION OF DIALYSIS CATHETER;  Surgeon: Conrad Champion, MD;  Location: [place];  Service: Vascular;  Laterality: Right;  right internal jugular  . INSERTION OF MESH Right 08/05/2016   Procedure: INSERTION OF MESH;  Surgeon: Donnie Mesa, MD;  Location: [place];  Service: General;  Laterality: Right;  . IR GASTROSTOMY TUBE MOD SED  01/09/2020  . IR THORACENTESIS ASP PLEURAL SPACE W/IMG GUIDE  01/12/2020  . REVISION OF ARTERIOVENOUS GORETEX GRAFT Left 1/95/9747   Procedure: PLICATION OF ARTERIOVENOUS FISTULA LEFT ARM;  Surgeon: Elam Dutch, MD;  Location: [facility];  Service: Vascular;  Laterality: Left;  . REVISON OF ARTERIOVENOUS  FISTULA Left 01/06/2017   Procedure: REVISON OF ARTERIOVENOUS FISTULA;  Surgeon: Conrad Meridian, MD;  Location: [facility];  Service: Vascular;  Laterality: Left;  . REVISON OF ARTERIOVENOUS FISTULA Left 10/27/2018   Procedure: REVISION PLICATION OF ARTERIOVENOUS FISTULA  LEFT UPPER ARM;  Surgeon: Waynetta Sandy, MD;  Location: [facility];  Service: Vascular;  Laterality: Left;  . TRACHEOSTOMY TUBE PLACEMENT N/A 11/24/2019   Procedure: TRACHEOSTOMY;  Surgeon: Izora Gala, MD;  Location: [place];  Service: ENT;  Laterality: N/A;  . UMBILICAL HERNIA REPAIR N/A 08/05/2016   Procedure: [place];  Surgeon: Donnie Mesa, MD;  Location: [place];  Service: General;  Laterality: N/A;  . VENOGRAM N/A 08/09/2012   Procedure: VENOGRAM;  Surgeon: Conrad [place], MD;  Location: [facility] CATH LAB;  Service: Cardiovascular;  Laterality: N/A;    Assessment & Plan Clinical Impression: Patient is a 54 year old male with history of ESRD--HD MWF, CHF, CAF, anemia of chronic disease, NICM, polysubstance abuse; who was originally admitted  from his HD center with PEA arrest 3 hours into his HD session.  History taken from chart review due to behavior/cognition.  He received CPR with ACLS protocol and had ROSC after 15 minutes. Emergent cricothyrotomy performed by EMS due to failed intubation. He was treated with cooling protocol.  Echocardiogram showed ejection fraction of 15-20% with septal flattening c/w RV pressure and volume overload and also found to be in A fib with tracheal aspirate positive for H Flu.  CT chest showed possible subsegmental PE on right and left as well as large left pleural effusion.Marland Kitchen  He required vasopressors with CRRT and was started on IV heparin and has been treated with multiple rounds of antibiotics.  He had recurrent VF arrest on 01/23/2020 and was treated with lidocaine and amiodarone gtt.   Dr. Allred/Cards recommended supportive care--patient not a candidate for advanced CHF therapies  or ICD due to history of substance abuse as well as poor overall prognosis and no plans for ischemic work up. Cricothyrotomy converted to tracheostomy 11/24/2019 by ENT.  Hospital course further complicated by volume overload with volume overload with hypotension and VDRF. He was discharged to [facility] on 12/23/2019 for vent wean and medical management.   Pericardial effusion monitored with repeat echo showing moderate size effusion without tamponade and he was treated with colchicine.  Gastrostomy tube was placed on 01/09/2020 by Dr. Annamaria Boots.  He developed neck edema on 01/27 and CT neck showed circumferential mucosal thickening involving oropharynx, supraglottic larynx likely due to acute pharyngitis and with supraglottic airway narrowed to 2 mm at narrowest point also incidental findings of large layering bilateral pleural effusions R>L partially visualized. He was treated with course of Augmentin and steroids. He also underwent thoracocentesis of 250 cc ambler fluid from right lung on 1/28.  He continues to be NPO with tube feeds ongoing but has had difficulty tolerating this due to persistent abdominal distension as well as high residuals. Abdominal ultrasound showed minimal ascites and multiple KUBs have been negative for ileus. His tube feed slowly increased to 40 cc/hr by 02/16 and he is tolerating this without discomfort. He was weaned to [facility] 02/09, capping trials were started on 02/15 and he was decannulated without difficulty on 02/01/2020.  Anxiety issues with reports of panic disorder managed with scheduled Klonopin and ativan prn. Seroquel discontinued due to concern of EPS and on cogentin. Patient with cognitive deficits as well as debility due to ABI with multiple medical issues.  Patient transferred to CIR on 02/03/2020 .   Patient currently requires mod with mobility secondary to muscle weakness and muscle joint tightness, decreased cardiorespiratoy endurance and decreased oxygen support, decreased  initiation, decreased attention, decreased awareness, decreased problem solving, decreased safety awareness, decreased memory and delayed processing and decreased sitting balance, decreased standing balance, decreased postural control and decreased balance strategies.  Prior to hospitalization, patient was independent  with mobility and lived with   in a House home.  Home access is 2Stairs to enter.  Patient will benefit from skilled PT intervention to maximize safe functional mobility, minimize fall risk and decrease caregiver burden for planned discharge home with 24 hour supervision.  Anticipate patient will benefit from follow up [place] at discharge.  PT - End of Session Activity Tolerance: Tolerates 10 - 20 min activity with multiple rests Endurance Deficit: Yes PT Assessment Rehab Potential (ACUTE/IP ONLY): Good PT Barriers to Discharge: Medical stability;Home environment access/layout;Trach;Wound Care;Hemodialysis;Medication compliance;Behavior PT Patient demonstrates impairments in the following area(s): Balance;Behavior;Edema;Endurance;Motor;Nutrition;Pain;Perception;Safety;Sensory;Skin Integrity PT Transfers Functional  Problem(s): Bed Mobility;Bed to Chair;Car;Furniture;Floor PT Locomotion Functional Problem(s): Ambulation;Wheelchair Mobility;Stairs PT Plan PT Intensity: Minimum of 1-2 x/day ,45 to 90 minutes PT Frequency: 5 out of 7 days PT Duration Estimated Length of Stay: 10-14 days PT Treatment/Interventions: Ambulation/gait training;Discharge planning;Functional mobility training;Psychosocial support;Therapeutic Activities;Visual/perceptual remediation/compensation;Wheelchair propulsion/positioning;Therapeutic Exercise;Skin care/wound management;Neuromuscular re-education;Disease management/prevention;Balance/vestibular training;Cognitive remediation/compensation;Pain management;DME/adaptive equipment instruction;UE/LE Strength taining/ROM;Splinting/orthotics;UE/LE Coordination  activities;Stair training;Patient/family education;Functional electrical stimulation;Community reintegration PT Transfers Anticipated Outcome(s): supervision assist with LRAD . PT Locomotion Anticipated Outcome(s): Ambulatory with LRAd at house hold level PT Recommendation Recommendations for Other Services: Neuropsych consult Follow Up Recommendations: Home health PT Patient destination: Home Equipment Recommended: Rolling walker with 5" wheels;Wheelchair (measurements);Wheelchair cushion (measurements)  Skilled Therapeutic Intervention Pt received sitting in WC and agreeable to PT. Father present to confirm home set up. PT instructed patient in PT Evaluation and initiated treatment intervention; see below for results. PT educated patient in [place], rehab potential, rehab goals, and discharge recommendations.   Gait training with RW x 52f as listed below with min assist. Throughout treatment pt required mod assist to power up from [facility] In[facility] to gluteal/quad weakness. Also required mod assist for car transfers for BLE management due to hip flexion weakness. WC mobility x [id number] listed below with moderate cues for increased shoulder ROM and attention to task. Pt unable to perform stair management due to fear of falling and BLE weakness as this day. Patient returned to [facility] and [facility] and all needs met.       PT Evaluation Precautions/Restrictions   fall. Peg. Removed trach. [place]co shoes.  General   Vital SignsTherapy Vitals Temp: 97.8 F (36.6 C) Pulse Rate: 87 Resp: 18 BP: 93/63 Patient Position (if appropriate): Sitting Oxygen Therapy SpO2: 97 % O2 Device: Room Air Pain   denies Home Living/Prior Functioning Home Living Available Help at Discharge: Family;Available 24 hours/day Type of Home: House Home Access: Stairs to enter [employer]f Steps: 2 Entrance Stairs-Rails: Can reach both Home Layout: One level Bathroom Shower/Tub:  TuChiropodistStandard Bathroom Accessibility: Yes Additional Comments: father states there are no steps to enter house. all 1 level. Prior Function Level of Independence: Independent with basic ADLs;Independent with homemaking with ambulation  Able to Take Stairs?: Yes Driving: No Comments: hx of polysubstance abuse Vision/Perception  Perception Perception: Within Functional Limits Praxis Praxis: Impaired Praxis Impairment Details: Initiation Praxis-Other Comments: delayed initiation  Cognition Overall Cognitive Status: Impaired/Different from baseline Arousal/Alertness: Lethargic Orientation Level: Oriented X4 Attention: Sustained Sustained Attention: Impaired Sustained Attention Impairment: Verbal basic;Functional basic Memory: Impaired Memory Impairment: Decreased short term memory;Decreased recall of new information Decreased Short Term Memory: Verbal basic;Functional basic Awareness: Impaired Awareness Impairment: Intellectual impairment Problem Solving: Impaired Problem Solving Impairment: Verbal basic;Functional basic Behaviors: Poor frustration tolerance Safety/Judgment: Impaired Sensation Sensation Light Touch: Impaired by gross assessment Additional Comments: limited sensation in bil feet. Coordination Gross Motor Movements are Fluid and Coordinated: No(limited by weakness in BLE) Fine Motor Movements are Fluid and Coordinated: No Heel Shin Test: limited by weakness in BLE Motor  Motor Motor: Abnormal postural alignment and control  Mobility Bed Mobility Bed Mobility: Rolling Right;Rolling Left;Sit to Supine;Supine to Sit Rolling Right: Minimal Assistance - Patient > 75% Rolling Left: Minimal Assistance - Patient > 75% Supine to Sit: Minimal Assistance - Patient > 75% Sit to Supine: Minimal Assistance - Patient > 75% Transfers Transfers: Sit to Stand;Stand to Sit;Stand Pivot Transfers Sit to Stand: Moderate Assistance - Patient  50-74%  Stand to Sit: Moderate Assistance - Patient 50-74% Stand Pivot Transfers: Moderate Assistance - Patient 50 - 74% Stand Pivot Transfer Details: Verbal cues for safe use of DME/AE;Manual facilitation for weight shifting;Verbal cues for sequencing Transfer (Assistive device): Rolling walker Locomotion  Gait Ambulation: Yes Gait Assistance: Minimal Assistance - Patient > 75% Gait Distance (Feet): 50 Feet Assistive device: Rolling walker Gait Assistance Details: Verbal cues for technique;Verbal cues for precautions/safety;Verbal cues for gait pattern;Verbal cues for safe use of DME/AE Gait Gait: Yes Gait Pattern: Impaired Gait Pattern: Decreased stride length;Narrow base of support;Right foot flat;Left foot flat;Lateral hip instability Stairs / Additional Locomotion Stairs: No Wheelchair Mobility Wheelchair Mobility: Yes Wheelchair Assistance: Minimal assistance - Patient >75% Wheelchair Propulsion: Both upper extremities Wheelchair Parts Management: Needs assistance Distance: 75  Trunk/Postural Assessment  Cervical Assessment Cervical Assessment: Exceptions to WFL(head forward) Thoracic Assessment Thoracic Assessment: Exceptions to WFL(rounded shoulders) Lumbar Assessment Lumbar Assessment: Exceptions to WFL(post pelvic tilt) Postural Control Postural Control: Deficits on evaluation Righting Reactions: delayed Protective Responses: delayed  Balance Balance Balance Assessed: Yes Static Sitting Balance Static Sitting - Balance Support: No upper extremity supported Static Sitting - Level of Assistance: 5: Stand by assistance Dynamic Sitting Balance Dynamic Sitting - Level of Assistance: 4: Min assist Dynamic Sitting Balance - Compensations: decreased arousal increased needs for A at EOB fading to S when Static Standing Balance Static Standing - Balance Support: Bilateral upper extremity supported Static Standing - Level of Assistance: 4: Min assist Dynamic Standing  Balance Dynamic Standing - Balance Support: Bilateral upper extremity supported;During functional activity Dynamic Standing - Level of Assistance: 4: Min assist;3: Mod assist Extremity Assessment      RLE Assessment RLE Assessment: Exceptions to [facility] General Strength Comments: grossly 4-/5 proximal to distal except hip flexion 2+/5. hip extension 3+/5. LLE Assessment LLE Assessment: Exceptions to [facility] General Strength Comments: grossly 4-/5 proximal to distal except hip flexion 2+/5. hip extension and ankle DF 3+/5.    Refer to Care Plan for Long Term Goals  Recommendations for other services: Neuropsych  Discharge Criteria: Patient will be discharged from PT if patient refuses treatment 3 consecutive times without medical reason, if treatment goals not met, if there is a change in medical status, if patient makes no progress towards goals or if patient is discharged from hospital.  The above assessment, treatment plan, treatment alternatives and goals were discussed and mutually agreed upon: by patient  Lorie Phenix 02/04/2020, 3:25 PM

## 2020-02-04 NOTE — Plan of Care (Signed)
  Problem: Consults Goal: RH GENERAL PATIENT EDUCATION Description: See Patient Education module for education specifics. Outcome: Progressing   Problem: RH BOWEL ELIMINATION Goal: RH STG MANAGE BOWEL WITH ASSISTANCE Description: STG Manage Bowel with Assistance. mod Outcome: Progressing   Problem: RH SKIN INTEGRITY Goal: RH STG SKIN FREE OF INFECTION/BREAKDOWN Description: Free of breakdown and infection with mod assist Outcome: Progressing Goal: RH STG ABLE TO PERFORM INCISION/WOUND CARE W/ASSISTANCE Description: STG Able To Perform Incision/Wound Care With Assistance. Mod Outcome: Progressing   Problem: RH SAFETY Goal: RH STG ADHERE TO SAFETY PRECAUTIONS W/ASSISTANCE/DEVICE Description: STG Adhere to Safety Precautions With Assistance/Device. Mod Outcome: Progressing   Problem: RH PAIN MANAGEMENT Goal: RH STG PAIN MANAGED AT OR BELOW PT'S PAIN GOAL Description: Less than 4 Outcome: Progressing

## 2020-02-04 NOTE — Consult Note (Signed)
[facility] Renal Consultation Note  Indication for Consultation:  Management of ESRD/hemodialysis; anemia, hypertension/volume and secondary hyperparathyroidism  HPI: [name] [name] is a 54 y.o. male with  ESRD--HD MWF ([facility]), CHF, CAF, anemia of chronic disease, NICM, polysubstance abuse;  originally admitted from his HD center with PEA arrest 3 hours into his HD session.  History taken from chart as pt  Has poor mentation/cognation.   Noted he received CPR and had ROSC after 15 minutes. Emergent cricothyrotomy performed by EMS due to failed intubation /treated with cooling protocol had  Echo = EF  of 15-20% with septal flattening c/w RV pressure and volume overload / also  in A fib / tracheal aspirate positive for H Flu.  CT chest showed possible subsegmental PE on right and left as well as large left pleural effusion/ required vasopressors with CRRT/started on IV heparin and has been treated with multiple rounds of antibiotics.  He had recurrent VF arrest on 01/23/2020 Tx with  lidocaine and amiodarone gtt.   Dr. Allred/Cards recommended supportive care--patient not a candidate for advanced CHF therapies or ICD due to history of substance abuse as well as poor overall prognosis and no plans for ischemic work up. Cricothyrotomy converted to tracheostomy 11/24/2019 by ENT.  Hospital course further complicated by volume overload with volume overload with hypotension and VDRF. He was discharged to [facility] on 12/23/2019 for vent wean and medical management.   Pericardial effusion monitored with repeat echo showing moderate size effusion without tamponade and he was treated with colchicine.  Gastrostomy tube was placed on 01/09/2020 by Dr. Annamaria Boots.  He developed neck edema on 01/27 and CT = due to acute pharyngitis and with supraglottic airway narrowed to 2 mm at narrowest point also incidental findings of large layering bilateral pleural effusions R>L partially visualized. He was treated with course of  Augmentin and steroids. He also underwent thoracocentesis of 250 cc ambler fluid from right lung on 1/28.  He continues to be NPO with tube feeds ongoing but has had difficulty tolerating this due to persistent abdominal distension as well as high residuals. Abdominal ultrasound showed minimal ascites and multiple KUBs have been negative for ileus. His tube feed slowly increased to 40 cc/hr by 02/16 and he is tolerating this without discomfort. He was weaned to [facility] 02/09, capping trials were started on 02/15 and he was decannulated without difficulty on 02/01/2020.  Anxiety issues with reports of panic disorder managed with scheduled Klonopin and ativan prn. Seroquel discontinued due to concern of EPS and on cogentin. Patient with cognitive deficits as well as debility due to ABI with multiple medical issues and transferred to [facility]  02/02/2020 . He had HD last on Friday  02/03/20  Wt 62.6 kg with admit to CIR.  Currently sitting upright in Wheelchair trying to take strap off his chest that is securing him . He denies sob or CP. Able to oriented to [place] and thinks today is Friday . Recognized me from op dialysis .      Past Medical History:  Diagnosis Date  . Abdominal distension 08/10/2018  . Acute on chronic respiratory failure with hypoxia ([place])   . Acute on chronic systolic and diastolic heart failure, NYHA class 4 ([place])   . Acute pulmonary embolism without acute cor pulmonale (HCC)   . Alcohol abuse 11/26/2010   Qualifier: Diagnosis of  By: [name] MD, Pershing Proud  . Anemia due to blood loss, acute 07/15/2012  . CHF (congestive heart failure) ([place])   .  Cholecystitis 04/18/2014  . Chronic atrial fibrillation ([place])   . Cigarette smoker    has currently quit  . Cocaine abuse ([place]) 11/26/2010   Qualifier: Diagnosis of  By: [name] MD, Benjamine Mola  has been over a year  . Combined congestive systolic and diastolic heart failure ([place]) 04/18/2014   A. Echo 8/13: Severe LVH, EF 40-45%,  inferoposterior akinesis, grade 2 diastolic dysfunction, moderate LAE, mild RVE, mildly reduced RVSF, mild RAE; cannot rule out R atrial mass-suggest TEE or cardiac MRI  //  B. Echo 3/17: Mild LVH, EF 25-30%, diffuse HK, grade 2 diastolic dysfunction, mild MR, severe LAE,  moderately reduced RVSF, severe RAE, mild TR, moderate PI, PASP 55 mmHg    . Dyspnea   . End stage renal disease on dialysis ([place])   . ESRD (end stage renal disease) on dialysis [facility])    MWF- [place] (05/18/2017)  . Hemodialysis patient ([employer])    M,W,F  . Hernia, inguinal, right 08/05/2016  . Hypertension   . 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 Hypocalcemia 07/15/2012  . Medical non-compliance   . Non-ischemic cardiomyopathy ([place])    A. R/L HC 3/17: Normal coronary arteries, moderate pulmonary hypertension (PASP 65 mmHg), elevated LV filling pressures (LVEDP 45 mmHg)   . NSVT (nonsustained ventricular tachycardia) ([place]) 02/22/2016  . NSVT (nonsustained ventricular tachycardia) ([place]) 02/22/2016  . Pneumonia 11/12/2017  . Polysubstance abuse ([place])   . Prolonged Q-T interval on ECG 05/18/2017  . Prolonged QT interval 05/18/2017  . Restless leg syndrome   . Solitary pulmonary nodule 10/10/2015   See cxr 10/09/2015 - CT rec 10/10/2015 >>>   . Upper airway cough syndrome 10/09/2015   Off ACEi around 1st Oct 2016  - Sinus CT 10/10/2015 >>>      Past Surgical History:  Procedure Laterality Date  . [place] TRANSPOSITION  01/19/2013   Procedure: BASCILIC VEIN TRANSPOSITION;  Surgeon: Conrad [place], MD;  Location: [place];  Service: Vascular;  Laterality: Left;  left 2nd stage basilic vein transposition  . CARDIAC CATHETERIZATION N/A 02/25/2016   Procedure: Right/Left Heart Cath and Coronary Angiography;  Surgeon: Burnell Blanks, MD;  Location: [place] CV LAB;  Service: Cardiovascular;   Laterality: N/A;  . INGUINAL HERNIA REPAIR Right 08/05/2016   Procedure: RIGHT INGUINAL HERNIA REPAIR WITH MESH;  Surgeon: Donnie Mesa, MD;  Location: [place];  Service: General;  Laterality: Right;  . INSERTION OF DIALYSIS CATHETER  07/17/2012   Procedure: INSERTION OF DIALYSIS CATHETER;  Surgeon: Conrad [place], MD;  Location: [place];  Service: Vascular;  Laterality: Right;  right internal jugular  . INSERTION OF MESH Right 08/05/2016   Procedure: INSERTION OF MESH;  Surgeon: Donnie Mesa, MD;  Location: [place];  Service: General;  Laterality: Right;  . IR GASTROSTOMY TUBE MOD SED  01/09/2020  . IR THORACENTESIS ASP PLEURAL SPACE W/IMG GUIDE  01/12/2020  . REVISION OF ARTERIOVENOUS GORETEX GRAFT Left 08/16/4096   Procedure: PLICATION OF ARTERIOVENOUS FISTULA LEFT ARM;  Surgeon: Elam Dutch, MD;  Location: [place];  Service: Vascular;  Laterality: Left;  . REVISON OF ARTERIOVENOUS FISTULA Left 01/06/2017   Procedure: REVISON OF ARTERIOVENOUS FISTULA;  Surgeon: Conrad [place], MD;  Location: [place];  Service: Vascular;  Laterality: Left;  . REVISON OF ARTERIOVENOUS FISTULA Left 10/27/2018   Procedure: REVISION PLICATION OF ARTERIOVENOUS FISTULA  LEFT UPPER ARM;  Surgeon: Waynetta Sandy, MD;  Location: [place];  Service: Vascular;  Laterality: Left;  . TRACHEOSTOMY TUBE PLACEMENT N/A 11/24/2019   Procedure: TRACHEOSTOMY;  Surgeon: Izora Gala, MD;  Location: [place];  Service: ENT;  Laterality: N/A;  . UMBILICAL HERNIA REPAIR N/A 08/05/2016   Procedure: [place];  Surgeon: Donnie Mesa, MD;  Location: [place];  Service: General;  Laterality: N/A;  . VENOGRAM N/A 08/09/2012   Procedure: VENOGRAM;  Surgeon: Conrad [place], MD;  Location: [facility] CATH LAB;  Service: Cardiovascular;  Laterality: N/A;      Family History  Problem Relation Age of Onset  . Hypertension Mother   . Diabetes Mother   . Renal Disease Mother   . Hypertension Father       reports that he quit smoking  about 15 months ago. His smoking use included cigarettes. He quit after 30.00 years of use. He has never used smokeless tobacco. He reports current alcohol use of about 4.0 standard drinks of alcohol per week. He reports that he does not use drugs.   Allergies  Allergen Reactions  . Losartan Cough    Prior to Admission medications   Medication Sig Start Date End Date Taking? Authorizing Provider  acetaminophen (TYLENOL) 650 MG suppository Place 1 suppository (650 mg total) rectally every 6 (six) hours as needed for fever. 12/23/19   Kathi Ludwig, MD  Amino Acids-Protein Hydrolys (FEEDING SUPPLEMENT, PRO-STAT SUGAR FREE 64,) LIQD Place 30 mLs into feeding tube 5 (five) times daily. 12/23/19   Kathi Ludwig, MD  amiodarone (PACERONE) 200 MG tablet Place 1 tablet (200 mg total) into feeding tube daily. 12/24/19   Kathi Ludwig, MD  apixaban (ELIQUIS) 5 MG TABS tablet Place 1 tablet (5 mg total) into feeding tube 2 (two) times daily. 12/23/19   Kathi Ludwig, MD  calcium acetate (PHOSLO) 667 MG capsule Take 3 capsules (2,001 mg total) by mouth 3 (three) times daily with meals. Patient taking differently: Take 1,334-3,335 mg by mouth See admin instructions. Take 3-5 capsules (2001 - 3335 mg) by mouth 3 times daily with large meals, take 2 capsules (1334 mg) with snacks 04/21/14   Mikhail, [place], DO  clonazePAM (KLONOPIN) 1 MG tablet Place 1 tablet (1 mg total) into feeding tube 3 (three) times daily as needed (Vent weaning related anxiety). 12/23/19   Kathi Ludwig, MD  Darbepoetin Alfa (ARANESP) 100 MCG/0.5ML SOSY injection Inject 0.5 mLs (100 mcg total) into the vein every Wednesday with hemodialysis. 12/28/19   Kathi Ludwig, MD  docusate (COLACE) 50 MG/5ML liquid Place 10 mLs (100 mg total) into feeding tube daily. 12/24/19   Kathi Ludwig, MD  insulin aspart (NOVOLOG) 100 UNIT/ML injection Inject 0-9 Units into the skin every 4 (four) hours. 12/23/19   Kathi Ludwig, MD  midodrine (PROAMATINE) 10 MG tablet Place 1 tablet (10 mg total) into feeding tube 3 (three) times daily with meals. 12/23/19   Kathi Ludwig, MD  multivitamin (RENA-VIT) TABS tablet Take 1 tablet by mouth at bedtime. 12/23/19   Kathi Ludwig, MD  nutrition supplement, JUVEN, (JUVEN) PACK Place 1 packet into feeding tube 2 (two) times daily. 12/23/19   Kathi Ludwig, MD  Nutritional Supplements (FEEDING SUPPLEMENT, VITAL 1.5 CAL,) LIQD Place 1,000 mLs into feeding tube continuous. 12/23/19   Kathi Ludwig, MD  oxyCODONE (ROXICODONE) 5 MG/5ML solution Take 7.5 mLs (7.5 mg total) by mouth every 4 (four) hours as needed for moderate pain. 12/23/19   Harbrecht,  Lawrence, MD  pantoprazole sodium (PROTONIX) 40 mg/20 mL PACK Place 20 mLs (40 mg total) into feeding tube at bedtime. 12/23/19   Kathi Ludwig, MD  prochlorperazine (COMPAZINE) 5 MG tablet Place 1 tablet (5 mg total) into feeding tube every 6 (six) hours as needed for nausea or vomiting (hiccups). 12/23/19   Kathi Ludwig, MD  QUEtiapine (SEROQUEL) 25 MG tablet Take 1 tablet (25 mg total) by mouth 2 (two) times daily. 12/23/19   Kathi Ludwig, MD     Results for orders placed or performed during the hospital encounter of 12/24/19 (from the past 48 hour(s))  Novel Coronavirus, NAA (hospital order; send-out to ref lab)     Status: None   Collection Time: 02/02/20  3:15 PM   Specimen: Nasopharyngeal Swab; Respiratory  Result Value Ref Range   SARS-CoV-2, NAA NOT DETECTED NOT DETECTED    Comment: (NOTE) This nucleic acid amplification test was developed and its performance characteristics determined by [employer]. Nucleic acid amplification tests include RT-PCR and TMA. This test has not been FDA cleared or approved. This test has been authorized by FDA under an Emergency Use Authorization (EUA). This test is only authorized for the duration of time the declaration that circumstances exist  justifying the authorization of the emergency use of in vitro diagnostic tests for detection of SARS-CoV-2 virus and/or diagnosis of COVID-19 infection under section 564(b)(1) of the Act, 21 U.S.C. 950DTO-6(Z) (1), unless the authorization is terminated or revoked sooner. When diagnostic testing is negative, the possibility of a false negative result should be considered in the context of a patient's recent exposures and the presence of clinical signs and symptoms consistent with COVID-19. An individual without symptoms of COVID- 19 and who is not shedding SARS-CoV-2  virus would expect to have a negative (not detected) result in this assay. Performed At: [facility] [place], [place] [id number] Rush Farmer MD PJ:[id number]    Coronavirus Source NASOPHARYNGEAL     Comment: Performed at [facility], [place] [address] [place], [place] [address]  CBC     Status: Abnormal   Collection Time: 02/03/20  6:53 AM  Result Value Ref Range   WBC 8.2 4.0 - 10.5 K/uL   RBC 4.52 4.22 - 5.81 MIL/uL   Hemoglobin 12.3 (L) 13.0 - 17.0 g/dL   HCT 40.3 39.0 - 52.0 %   MCV 89.2 80.0 - 100.0 fL   MCH 27.2 26.0 - 34.0 pg   MCHC 30.5 30.0 - 36.0 g/dL   RDW 20.9 (H) 11.5 - 15.5 %   Platelets 114 (L) 150 - 400 K/uL    Comment: REPEATED TO VERIFY PLATELET COUNT CONFIRMED BY SMEAR Immature Platelet Fraction may be clinically indicated, consider ordering this additional test [id number]    nRBC 0.5 (H) 0.0 - 0.2 %    Comment: Performed at [facility], [place] [address], [place], [place] [address]  Renal function panel     Status: Abnormal   Collection Time: 02/03/20  6:53 AM  Result Value Ref Range   Sodium 135 135 - 145 mmol/L   Potassium 4.0 3.5 - 5.1 mmol/L   Chloride 93 (L) 98 - 111 mmol/L   CO2 24 22 - 32 mmol/L   Glucose, Bld 106 (H) 70 - 99 mg/dL   BUN 78 (H) 6 - 20 mg/dL   Creatinine, Ser 5.75 (H) 0.61 - 1.24 mg/dL   Calcium 8.9 8.9 - 10.3 mg/dL   Phosphorus  2.5 2.5 -  4.6 mg/dL   Albumin 3.5 3.5 - 5.0 g/dL   GFR calc non Af Amer 10 (L) >60 mL/min   GFR calc Af Amer 12 (L) >60 mL/min   Anion gap 18 (H) 5 - 15    Comment: Performed at [facility] [address], , [place] [address]   ROS: see hpi  Pt poor historian   Physical Exam: Vitals:   02/04/20 0308 02/04/20 0808  BP: 110/86 100/80  Pulse: 86 (!) 104  Resp: 20   Temp: (!) 96.9 F (36.1 C)   SpO2: 100% 92%     General: thin chronically ill appearing aam  HEENT: [place] , EOMI  ,MM dry  Neck: , supple ,  +stoma /t bandage intact dry/clean Heart: RRR no m,r,g  Lungs: slightly deceased resp effort Abdomen: BS pos soft  PEG in place , NT Extremities: trace bipedal edema  Skin: Bilat toes with dry gangrene / scattered healing lesions BLE,BUE Neuro: moves all extrem to requests, alert   Dialysis Access: Pos bruit LUA AVF   OLD OP Dialysis Orders: from op unit = [facility]   on MWF . EDW 74.5 kg  HD Bath 2k 2.5 ca   Time 4hr 43min  Heparin 5000. Access LUA AVF    Calcitriol 0.71mcg po /HD  NO OP esa / Venofer     Assessment/Plan 1. ESRD -  HD MWF  schedule  2. Deconditioning  Sp PEA  Extended  hosp. = plans per  CIR    3. Hypertension/volume  -bp stable currently  on midodrine/ metop  At [facility] was getting Alb with hd ,  4. Anemia  - hgb 12.3 no esa needs ,monitor  5. Metabolic bone disease - phos 2.5  ca 8.9  Needs pth  Hold vit d until back, 6. Nutrition - on peg feeding  Alb 3.9  7. CM  Ef 20% 8. HO Afib on eliquis  9. HO PE- on eliquis  10. Ho anxiety - meds per admit   Ernest Haber, PA-C [place] [phone] 02/04/2020, 9:33 AM

## 2020-02-04 NOTE — Progress Notes (Signed)
Initial Nutrition Assessment  DOCUMENTATION CODES:   Not applicable  INTERVENTION:   Tube Feeding:  Change to Nocturnal TF Nepro at 50 ml/hr x 12 hours Pro-Stat 30 mL BID Provides 1280 kcals, 79 g of protein and 438 mL of free water Meets 50% calorie needs, 70% protein needs Follow po intake and make adjustments as needed  Nepro Shake po BID, each supplement provides 425 kcal and 19 grams protein  Continue Rena-Vit   NUTRITION DIAGNOSIS:   Increased nutrient needs related to acute illness, chronic illness as evidenced by estimated needs.  GOAL:   Patient will meet greater than or equal to 90% of their needs  MONITOR:   PO intake, Supplement acceptance, Labs, Weight trends, TF tolerance  REASON FOR ASSESSMENT:   Consult Enteral/tube feeding initiation and management, Assessment of nutrition requirement/status  ASSESSMENT:   54 yo male admitted to CIR from [facility] after prolonged hospitalization post cardiac arrest on 11/16/2019 requiring trach and PEG. PMH includes ESRD on HD, CHF, HTN, cocaine abuse, medical non-adherence.  RD working remotely.  12/2 Admit to [name] post Cardiac Arrest 1/08 Discharged and Admitted with [facility] 1/25 G tube placed 2/17 De-Cannulated  2/19 Admit to CIR, Renal diet initiated 2/20 Diet downgraded to Dysphagia 3, Nectar  Pt has been receiving Nepro at [facility] via PEG tube; previously NPO.   Current TF regimen: Nepro at 50 ml/hr, Pro-Stat 30 mL daily, free water 100 mL q 2 hours. Provides 2360 kcals, 127 g of protein and 1076 mL of free water  Recorded po intake 75-100%  Old outpatient EDW prior to admission in December was 74.5 kg; Current weight 62.6 kg; unsure of EDW at this time.   Dry gangrene bilateral feet  Labs: sodium 134 (L), potassium wdl, phosphorus 3.9 (wdl) Meds: thiamine, prednisone, Rena-Vit, reglan, folic acid   Diet Order:   Diet Order            DIET DYS 3 Room service appropriate? Yes;  Fluid consistency: Nectar Thick  Diet effective now              EDUCATION NEEDS:      Skin:  Skin Assessment: Skin Integrity Issues: Skin Integrity Issues:: Other (Comment) Other: bilateral feet with dry gangrene  Last BM:  2/20  Height:   Ht Readings from Last 1 Encounters:  12/17/19 5' 10.5" (1.791 m)    Weight:   Wt Readings from Last 1 Encounters:  02/04/20 62.6 kg    BMI:  Body mass index is 19.52 kg/m.  Estimated Nutritional Needs:   Kcal:  2200-2400 kcals  Protein:  110-120 g  Fluid:  1000 mL plus UOP    [name] MS, RDN, LDN, CNSC RD Pager Number and Weekend/On-Call After Hours Pager Located in [place]

## 2020-02-05 ENCOUNTER — Inpatient Hospital Stay (HOSPITAL_COMMUNITY): Payer: Medicare Other

## 2020-02-05 LAB — COMPREHENSIVE METABOLIC PANEL
ALT: 19 U/L (ref 0–44)
AST: 15 U/L (ref 15–41)
Albumin: 3.7 g/dL (ref 3.5–5.0)
Alkaline Phosphatase: 120 U/L (ref 38–126)
Anion gap: 18 — ABNORMAL HIGH (ref 5–15)
BUN: 101 mg/dL — ABNORMAL HIGH (ref 6–20)
CO2: 22 mmol/L (ref 22–32)
Calcium: 9.1 mg/dL (ref 8.9–10.3)
Chloride: 96 mmol/L — ABNORMAL LOW (ref 98–111)
Creatinine, Ser: 6.01 mg/dL — ABNORMAL HIGH (ref 0.61–1.24)
GFR calc Af Amer: 12 mL/min — ABNORMAL LOW (ref >60)
GFR calc non Af Amer: 10 mL/min — ABNORMAL LOW (ref >60)
Glucose, Bld: 124 mg/dL — ABNORMAL HIGH (ref 70–99)
Potassium: 4.7 mmol/L (ref 3.5–5.1)
Sodium: 136 mmol/L (ref 135–145)
Total Bilirubin: 0.7 mg/dL (ref 0.3–1.2)
Total Protein: 7.3 g/dL (ref 6.5–8.1)

## 2020-02-05 LAB — CBC
HCT: 42.7 % (ref 39.0–52.0)
Hemoglobin: 13.2 g/dL (ref 13.0–17.0)
MCH: 27.6 pg (ref 26.0–34.0)
MCHC: 30.9 g/dL (ref 30.0–36.0)
MCV: 89.3 fL (ref 80.0–100.0)
Platelets: 120 10*3/uL — ABNORMAL LOW (ref 150–400)
RBC: 4.78 MIL/uL (ref 4.22–5.81)
RDW: 21 % — ABNORMAL HIGH (ref 11.5–15.5)
WBC: 12.1 10*3/uL — ABNORMAL HIGH (ref 4.0–10.5)
nRBC: 0.2 % (ref 0.0–0.2)

## 2020-02-05 MED ORDER — CLONAZEPAM 0.5 MG PO TABS
1.0000 mg | ORAL_TABLET | Freq: Three times a day (TID) | ORAL | Status: DC
  Administered 2020-02-05 – 2020-02-10 (×12): 1 mg
  Filled 2020-02-05 (×12): qty 2

## 2020-02-05 MED ORDER — METOCLOPRAMIDE HCL 5 MG PO TABS
5.0000 mg | ORAL_TABLET | Freq: Every day | ORAL | Status: DC
  Administered 2020-02-06: 5 mg
  Filled 2020-02-05: qty 1

## 2020-02-05 MED ORDER — CLONAZEPAM 0.5 MG PO TABS: 1.0000 mg | ORAL_TABLET | Freq: Three times a day (TID) | ORAL | Status: DC

## 2020-02-05 MED ORDER — LORAZEPAM 0.5 MG PO TABS
1.0000 mg | ORAL_TABLET | Freq: Once | ORAL | Status: AC
  Administered 2020-02-05: 1 mg via ORAL
  Filled 2020-02-05: qty 2

## 2020-02-05 NOTE — Progress Notes (Signed)
Pt refused tube feeding due to eating most of meals. Pt educated and encouraged to eat as much as possible.

## 2020-02-05 NOTE — Progress Notes (Signed)
Patient's X ray result came back, MD notified, she ordered CBC and BMP. Lab result came back, MD notified but no new order given. We continue to monitor.

## 2020-02-05 NOTE — Progress Notes (Addendum)
Addendum 10:58pm: Patient remains afebrile and saturating well, but extremely anxious asking for nurse presence constantly. As per notes and nurse reports, this was similar to last night. 0.5mg  prn Ativan did not help last night; I ordered 1mg  one-time dose now for his anxiety. He is limited on what agents he can receive due to prolonged QT interval.   Addendum 6:53pm: I received a call from RN stating that patient had CXR today ordered by nephrology and read suggests possible pneumonia. I have personally reviewed CXR, nephrology note, and physiatry note. Appears that the atelectasis/airspace disease that radiology suggests may represent pneumonia was also present on prior CXR. Patient remains afebrile with stable vital signs and not requiring increasing oxygen. He appears comfortable as per RN. Ordered CBC and BMP which shows increase in WBC from 8 to 12, but is otherwise stable. Patient will have labs drawn with dialysis tomorrow and WBC can be trended, but seems that crackles on nephrologist's lung examination are more likely from pulmonary edema. Nephrology note indicates likewise. If patient does become febrile, there is low threshold to start antibiotics for possible pneumonia. Plan discussed with RN.   [place] [place] PHYSICAL MEDICINE & REHABILITATION PROGRESS NOTE  Subjective/Complaints: Patient seen sitting up in his chair this morning eating breakfast.  He states he did not sleep well overnight.  Discussed with nursing, patient did not sleep well overnight due to anxiety.  Constantly requires presents of nursing.  ROS: Denies CP, SOB, N/V/D  Objective: Vital Signs: Blood pressure 101/82, pulse 84, temperature (!) 97.4 F (36.3 C), temperature source Oral, resp. rate 18, weight 63.1 kg, SpO2 100 %. No results found. Recent Labs    02/03/20 0653  WBC 8.2  HGB 12.3*  HCT 40.3  PLT 114*   Recent Labs    02/03/20 0653 02/04/20 1231  NA 135 134*  K 4.0 4.1  CL 93* 91*  CO2 24 27   GLUCOSE 106* 103*  BUN 78* 63*  CREATININE 5.75* 4.59*  CALCIUM 8.9 8.9    Physical Exam: BP 101/82 (BP Location: Right Arm)   Pulse 84   Temp (!) 97.4 F (36.3 C) (Oral)   Resp 18   Wt 63.1 kg   SpO2 100%   BMI 19.68 kg/m  Constitutional: No distress . Vital signs reviewed.  Cachectic. HENT: Normocephalic.  Atraumatic.  + Stoma. Eyes: EOMI. No discharge.  Scleral wrists Cardiovascular: No JVD. Respiratory: Normal effort.  No stridor. GI: Non-distended.  + PEG. Skin: See above. Bilateral toes with gangrene Psych: Flat. Musc: No edema in extremities.  No tenderness in extremities. Neurological: Alert Motor: 3+-4 -/5 throughout (?  Full participation) Dysphonia, stable  Assessment/Plan: 1. Functional deficits secondary to encephalopathy which require 3+ hours per day of interdisciplinary therapy in a comprehensive inpatient rehab setting.  Physiatrist is providing close team supervision and 24 hour management of active medical problems listed below.  Physiatrist and rehab team continue to assess barriers to discharge/monitor patient progress toward functional and medical goals  Care Tool:  Bathing    Body parts bathed by patient: Right arm, Left arm, Chest, Abdomen, Front perineal area, Right lower leg, Left lower leg, Buttocks, Right upper leg, Left upper leg         Bathing assist Assist Level: Minimal Assistance - Patient > 75%     Upper Body Dressing/Undressing Upper body dressing   What is the patient wearing?: Pull over shirt    Upper body assist Assist Level: Contact Guard/Touching assist  Lower Body Dressing/Undressing Lower body dressing      What is the patient wearing?: Pants     Lower body assist Assist for lower body dressing: Moderate Assistance - Patient 50 - 74%     Toileting Toileting    Toileting assist Assist for toileting: Minimal Assistance - Patient > 75%     Transfers Chair/bed transfer  Transfers assist      Chair/bed transfer assist level: Moderate Assistance - Patient 50 - 74%     Locomotion Ambulation   Ambulation assist      Assist level: Minimal Assistance - Patient > 75% Assistive device: Hand held assist Max distance: 25   Walk 10 feet activity   Assist     Assist level: Minimal Assistance - Patient > 75% Assistive device: Walker-rolling   Walk 50 feet activity   Assist    Assist level: Minimal Assistance - Patient > 75% Assistive device: Walker-rolling    Walk 150 feet activity   Assist Walk 150 feet activity did not occur: Safety/medical concerns         Walk 10 feet on uneven surface  activity   Assist Walk 10 feet on uneven surfaces activity did not occur: Safety/medical concerns         Wheelchair     Assist Will patient use wheelchair at discharge?: Yes Type of Wheelchair: Manual    Wheelchair assist level: Minimal Assistance - Patient > 75% Max wheelchair distance: 75    Wheelchair 50 feet with 2 turns activity    Assist        Assist Level: Minimal Assistance - Patient > 75%   Wheelchair 150 feet activity     Assist     Assist Level: Moderate Assistance - Patient 50 - 74%      Medical Problem List and Plan: 1.  Deficits with mobility, endurance, self-care secondary to encephalopathy with debility.  Continue 2.  Antithrombotics: -PE/anticoagulation:  Pharmaceutical: Other (comment)--on low dose Eliquis             -antiplatelet therapy:  N/A 3. Pain Management: Oxycodone or tramadol prn 4. Mood: LCSW to follow for evaluation and support.    See #14             -antipsychotic agents: N/A   See #14 5. Neuropsych: This patient is not fully capable of making decisions on his own behalf. 6. Skin/Wound Care: Dry dressing to bilateral feet.              Monitor stoma for healing 7. Fluids/Electrolytes/Nutrition: Strict I/O.   8. A fib/NSVT: On amiodarone and metoprolol for rate control.              Rate  controlled on 2/21  Monitor with increased activity. 9.  ESRD: HD MWF at the end of the day to help with activity tolerance.  Filed Weights   02/04/20 0440 02/05/20 0339  Weight: 62.6 kg 63.1 kg  10. Pericardial effusion: Has been on colchicine since 1/19 and has been weaned to prednisone 10 mg daily. Last echo 1/29 shows decrease to small effusion. 11. Hypotension: Continue midodrine 10 mg tid.  Relatively controlled on 2/21             Monitor with increased mobility 12. Dysphagia:   Tolerating Nephro   Reglan 5 mg tid to help with residuals, decreased to daily on 2/21, continue to wean as tolerated             S/p PEG  Advanced to regular thins, downgraded again to D3 nectars after evaluation on 2/21, will continue to monitor with increased alertness and advance diet again as tolerated.  Discussed with therapies. 13. Dry gangrene bilateral feet: Needs to wear shoes for protection. Dry dressing. Maintain adequate nutritional status.  Monitor demarcation/healing. 14. Anxiety/depression: On Zoloft 25 mg/day.  Team support.  ECG reviewed showing prolonged QT  Resulting in sleep disturbance-constantly needs somebody to be with him  Request neuropsych eval  Continue melatonin  CMP reviewed-no obvious association with lethargy  Klonopin increased to 1 mg 3 times daily and at bedtime-monitor for lethargy  Will hold off on Seroquel at present due to QTC  LOS: 2 days A FACE TO FACE EVALUATION WAS PERFORMED  Ankit Lorie Phenix 02/05/2020, 7:48 AM

## 2020-02-05 NOTE — Progress Notes (Signed)
Subjective:  Pt stated wat to sit on edge of bed to help feet fell better.Father in room  Objective Vital signs in last 24 hours: Vitals:   02/04/20 2218 02/05/20 0339 02/05/20 0920 02/05/20 1322  BP:  101/82 95/81 96/69   Pulse: 86 84 (!) 104 99  Resp:  18  18  Temp:    (!) 97.5 F (36.4 C)  TempSrc:      SpO2: 100% 100%  100%  Weight:  63.1 kg     Weight change: 0.5 kg  Physical Exam: General:alert ,cacheic  appearing male NAD Heart: RRR, no m, r g  Lungs: Crackles , 97-86 -RM O2 sat  Abdomen: BS  Pos ,PEG tube , soft , NT Extremities: no edema, Bilat feet in bandages   Dialysis Access:pos bruit LUA AVF   OLD OP Dialysis Orders: from op unit = [facility]   on MWF . EDW 74.5 kg  HD Bath 2k 2.5 ca   Time 4hr 15min  Heparin 5000. Access LUA AVF    Calcitriol 0.16mcg po /HD  NO OP esa / Venofer     Problem/Plan  1. ESRD -  HD MWF  schedule  2. Deconditioning  Sp PEA  Extended  hosp. = plans per  CIR    3. Hypertension/volume  -bp stable currently  on midodrine/ metop  At [facility] was getting Alb with hd ,CXR  Today  With Pulm edema  With O2 sat okay  , attempt 2.5 - 3 liter uf on hd tomor    4. Anemia  - hgb 12.3( 2/19) no esa needs ,monitor  5. Metabolic bone disease - phos 2.5  ca 8.9  Needs pth lab, Hold vit d until back, 6. Nutrition - on peg feeding  Alb 3.9  7. CM  Ef 20% 8. HO Afib on eliquis  9. HO PE- on eliquis  10. HO pericardial effusion = On colchicine (since 1/19/21and weaned Prednisone 10mg  q d) 11. Dysphagia - peg tube feeds and down graded again to D3 nectars after evaluation on 2/21   12. Ho anxiety - meds per admit   Ernest Haber, PA-C [facility] [phone] 02/05/2020,2:03 PM  LOS: 2 days   Labs: Basic Metabolic Panel: Recent Labs  Lab 01/30/20 0810 01/30/20 0810 02/01/20 1138 02/03/20 0653 02/04/20 1231  NA 135   < > 132* 135 134*  K 3.7   < > 3.4* 4.0 4.1  CL 90*   < > 91* 93* 91*  CO2 21*   < > 25 24 27   GLUCOSE  100*   < > 112* 106* 103*  BUN 115*   < > 78* 78* 63*  CREATININE 6.57*   < > 5.94* 5.75* 4.59*  CALCIUM 9.2   < > 9.0 8.9 8.9  PHOS 4.5  --  2.9 2.5  --    < > = values in this interval not displayed.   Liver Function Tests: Recent Labs  Lab 02/01/20 1138 02/03/20 0653 02/04/20 1231  AST  --   --  23  ALT  --   --  26  ALKPHOS  --   --  119  BILITOT  --   --  0.8  PROT  --   --  7.2  ALBUMIN 3.7 3.5 3.9   No results for input(s): LIPASE, AMYLASE in the last 168 hours. No results for input(s): AMMONIA in the last 168 hours. CBC: Recent Labs  Lab 01/30/20 0810 02/01/20 1138 02/03/20  0653  WBC 10.0 9.7 8.2  HGB 12.5* 12.1* 12.3*  HCT 40.0 39.2 40.3  MCV 87.5 89.7 89.2  PLT 142* 128* 114*   Cardiac Enzymes: No results for input(s): CKTOTAL, CKMB, CKMBINDEX, TROPONINI in the last 168 hours. CBG: No results for input(s): GLUCAP in the last 168 hours.  Studies/Results: DG Chest 2 View  Result Date: 02/05/2020 CLINICAL DATA:  Shortness of breath and cough EXAM: CHEST - 2 VIEW COMPARISON:  Chest radiograph dated 01/12/2020 FINDINGS: The cardiac silhouette is obscured but appears unchanged. Vascular calcifications are seen in the aortic arch. Bibasilar atelectasis/airspace disease appears similar to prior exam. Small bilateral pleural effusions are noted. Diffuse bilateral interstitial opacities likely represent pulmonary edema. There is no pneumothorax. IMPRESSION: Cardiomegaly and pulmonary edema. Small bilateral pleural effusions with associated atelectasis/airspace disease may represent pneumonia. Electronically Signed   By: Zerita Boers M.D.   On: 02/05/2020 12:16   Medications:  . apixaban  2.5 mg Per Tube BID  . ascorbic acid  500 mg Per Tube BID  . benztropine  1 mg Per Tube BID  . clonazePAM  1 mg Per Tube TID WC & HS  . colchicine  0.6 mg Per Tube Daily  . feeding supplement (NEPRO CARB STEADY)  1,000 mL Per Tube Q24H  . feeding supplement (NEPRO CARB STEADY)   237 mL Oral BID BM  . feeding supplement (PRO-STAT SUGAR FREE 64)  30 mL Per Tube BID  . folic acid  1 mg Per Tube Daily  . free water  100 mL Per Tube BID  . levothyroxine  25 mcg Per Tube Q0600  . mouth rinse  15 mL Mouth Rinse BID  . Melatonin  3 mg Per Tube QHS  . [START ON 02/06/2020] metoCLOPramide  5 mg Per Tube Daily  . metoprolol tartrate  25 mg Per Tube TID WC  . midodrine  10 mg Per Tube TID WC  . multivitamin  1 tablet Per Tube QHS  . predniSONE  10 mg Per Tube Q breakfast  . sertraline  25 mg Per Tube Daily  . thiamine  100 mg Per Tube Daily

## 2020-02-05 NOTE — Progress Notes (Signed)
Pt is very restless and agitated, in and out of bed, complaining of SOB. Patient vitals are WDL and O2 is 100  this writer called and got a one time dose of ativan.

## 2020-02-06 ENCOUNTER — Inpatient Hospital Stay (HOSPITAL_COMMUNITY): Payer: Medicare Other | Admitting: Physical Therapy

## 2020-02-06 ENCOUNTER — Inpatient Hospital Stay (HOSPITAL_COMMUNITY): Payer: Medicare Other | Admitting: Speech Pathology

## 2020-02-06 ENCOUNTER — Inpatient Hospital Stay (HOSPITAL_COMMUNITY): Payer: Medicare Other | Admitting: Occupational Therapy

## 2020-02-06 DIAGNOSIS — R1312 Dysphagia, oropharyngeal phase: Secondary | ICD-10-CM

## 2020-02-06 LAB — RENAL FUNCTION PANEL
Albumin: 3.8 g/dL (ref 3.5–5.0)
Anion gap: 18 — ABNORMAL HIGH (ref 5–15)
BUN: 126 mg/dL — ABNORMAL HIGH (ref 6–20)
CO2: 21 mmol/L — ABNORMAL LOW (ref 22–32)
Calcium: 9.1 mg/dL (ref 8.9–10.3)
Chloride: 94 mmol/L — ABNORMAL LOW (ref 98–111)
Creatinine, Ser: 6.92 mg/dL — ABNORMAL HIGH (ref 0.61–1.24)
GFR calc Af Amer: 10 mL/min — ABNORMAL LOW (ref >60)
GFR calc non Af Amer: 8 mL/min — ABNORMAL LOW (ref >60)
Glucose, Bld: 146 mg/dL — ABNORMAL HIGH (ref 70–99)
Phosphorus: 4 mg/dL (ref 2.5–4.6)
Potassium: 5.5 mmol/L — ABNORMAL HIGH (ref 3.5–5.1)
Sodium: 133 mmol/L — ABNORMAL LOW (ref 135–145)

## 2020-02-06 LAB — CBC
HCT: 43.1 % (ref 39.0–52.0)
Hemoglobin: 13.1 g/dL (ref 13.0–17.0)
MCH: 27.1 pg (ref 26.0–34.0)
MCHC: 30.4 g/dL (ref 30.0–36.0)
MCV: 89 fL (ref 80.0–100.0)
Platelets: 151 10*3/uL (ref 150–400)
RBC: 4.84 MIL/uL (ref 4.22–5.81)
RDW: 20.9 % — ABNORMAL HIGH (ref 11.5–15.5)
WBC: 10.6 10*3/uL — ABNORMAL HIGH (ref 4.0–10.5)
nRBC: 0 % (ref 0.0–0.2)

## 2020-02-06 MED ORDER — ALBUMIN HUMAN 25 % IV SOLN
INTRAVENOUS | Status: AC
  Administered 2020-02-06: 25 g
  Filled 2020-02-06: qty 100

## 2020-02-06 MED ORDER — OXYCODONE HCL 5 MG PO TABS
2.5000 mg | ORAL_TABLET | Freq: Four times a day (QID) | ORAL | Status: DC | PRN
  Administered 2020-02-07 – 2020-02-11 (×9): 2.5 mg
  Filled 2020-02-06 (×9): qty 1

## 2020-02-06 NOTE — Progress Notes (Signed)
Occupational Therapy Session Note  Patient Details  Name: [name] [name] MRN: [id number] Date of Birth: Sep 02, 1966  Today's Date: 02/06/2020 OT Individual Time: 6073-7106 OT Individual Time Calculation (min): 70 min    Short Term Goals: Week 1:  OT Short Term Goal 1 (Week 1): STG=LTG  Skilled Therapeutic Interventions/Progress Updates:    Treatment session with focus on initiation, sequencing, and endurance during self-care retraining and self-feeding tasks.  Pt received in bed with RN administering morning meds.  Pt lethargic throughout session, requiring increased time to initiate movement and responses to questions/commands.  Required up to max assist for bed mobility due to decreased initiation and lethargy.  Pt with minimal activation, ? Lethargy vs motivation vs initiation impairments.  Declined bathing, but agreeable to changing clothes and completing grooming tasks.  Setup assist to complete oral care seated EOB with cues for swallowing strategy.  Sit > stand during LB dressing with mod assist, cues for proper hand placement on bed/RW, and therapist completing total assist with clothing management.  Returned to bed per pt request.  However once positioned in chair position to allow for upright to eat breakfast, pt requested to transfer to recliner. Max assist again to come to sitting EOB and then stand pivot transfer with RW mod assist to recliner.  Therapist opened containers and provided mod cues for initiation and sequencing during self-feeding.  Pt remained up with nurse tech to finish providing supervision for self-feeding.  Therapy Documentation Precautions:  Restrictions Weight Bearing Restrictions: No General:   Vital Signs: Therapy Vitals Pulse Rate: 86 BP: 113/88 Pain: Pain Assessment Pain Scale: 0-10 Pain Score: 5  Faces Pain Scale: Hurts a little bit Pain Type: Acute pain Pain Location: Foot Pain Orientation: Right;Left Pain Intervention(s): Medication (See  eMAR);Repositioned   Therapy/Group: Individual Therapy  Simonne Come 02/06/2020, 9:05 AM

## 2020-02-06 NOTE — Progress Notes (Signed)
Physical Therapy Session Note  Patient Details  Name: [name] [name] MRN: [id number] Date of Birth: 1966-04-30  Today's Date: 02/06/2020 PT Individual Time: 1050-1115 PT Individual Time Calculation (min): 25 min   Short Term Goals: Week 1:  PT Short Term Goal 1 (Week 1): Pt will transfer to and from [facility] with min assist. PT Short Term Goal 2 (Week 1): Pt will ambulate 13ft with min assist and RW PT Short Term Goal 3 (Week 1): Pt will attend to task >5 mintues with only min cues from PT PT Short Term Goal 4 (Week 1): Pt will performed bed mobility with CGA  Skilled Therapeutic Interventions/Progress Updates:  Pt received in recliner with father present, pt nodding off to sleep with father reporting pt has been doing this. Father [name]) reports pt sleeps poorly at night at home - nurse made aware & PT suggested sleep chart & requested if pt could have something to help him sleep. [name] reports 1 level home with "one & a half" steps to enter without rails, with [name] & therapist discussing benefits of rail installation with [name] planning to look into it. [name] also cares for wife who is on dialysis & uses RW at baseline. Therapist assisted pt with returning to bed as pt very fatigued. Pt requires max assist for sit>stand, mod assist & extra time for stand pivot with RW. Pt requires max assist for sit>supine for BLE management & +2 for scooting to [facility]. Pt reports he wants to perform exercises & performs BLE hip adduction pillow squeezes with significant cuing to attend to task but pt falling asleep. Discussed potential change in schedule to 15/7 with [name], as well as safety measures to prevent falls (3 rails up unless he requests 4, and use of bed alarm on moderate setting). Pt left in bed with alarm set, call bell in reach, [name] leaving. Nurse made aware of pt's fatigue.  Pt on 2L/min supplemental oxygen via nasal cannula, SpO2 = 100%, HR = 80 bpm.  Therapy Documentation Precautions:   Precautions Precautions: Fall Precaution Comments: Peg, Darco shoes Restrictions Weight Bearing Restrictions: No   General: PT Amount of Missed Time (min): 50 Minutes PT Missed Treatment Reason: Patient fatigue  Pain: No c/o or behaviors demonstrating pain.    Therapy/Group: Individual Therapy  [name] 02/06/2020, 12:04 PM

## 2020-02-06 NOTE — Progress Notes (Signed)
Speech Language Pathology Daily Session Note  Patient Details  Name: [name] [name] MRN: [id number] Date of Birth: 08-20-1966  Today's Date: 02/06/2020 SLP Individual Time: 8756-4332 SLP Individual Time Calculation (min): 30 min  Missed Time: 15 minutes, fatigue   Short Term Goals: Week 1: SLP Short Term Goal 1 (Week 1): STGs=LTGs due to ELOS  Skilled Therapeutic Interventions: Skilled treatment session focused on cognitive and dysphagia goals. Upon arrival, patient was lethargic while upright in the recliner with his father present. [name] asking questions in regards to patient's current lethargy, etc. PA aware. SLP facilitated session by providing Max A verbal cues for initiation and sustained attention during oral care via the suction toothbrush. Patient consumed trails of ice chips without overt s/s of aspiration but required Max multimodal cues to attend to task. [name] educated in regards to patient's current swallowing and cognitive impairments and goals of skilled SLP intervention. He verbalized understanding. Patient unable to stay awake to participate in therapy, therefore, patient missed remaining 15 minutes of session. Patient left upright in recliner with alarm on and all needs within reach. Continue with current plan of care.      Pain No/Denies Pain   Therapy/Group: Individual Therapy  Rosemaria Inabinet 02/06/2020, 11:28 AM

## 2020-02-06 NOTE — Care Management (Signed)
[facility] Individual Statement of Services  Patient Name:  [name] [name]  Date:  02/06/2020  Welcome to the [facility].  Our goal is to provide you with an individualized program based on your diagnosis and situation, designed to meet your specific needs.  With this comprehensive rehabilitation program, you will be expected to participate in at least 3 hours of rehabilitation therapies Monday-Friday, with modified therapy programming on the weekends.  Your rehabilitation program will include the following services:  Physical Therapy (PT), Occupational Therapy (OT), Speech Therapy (ST), 24 hour per day rehabilitation nursing, Therapeutic Recreaction (TR), Neuropsychology, Case Management (Social Worker), Rehabilitation Medicine, Nutrition Services, Pharmacy Services and Other  Weekly team conferences will be held on Tuesdays  to discuss your progress.  Your Social Worker will talk with you frequently to get your input and to update you on team discussions.  Team conferences with you and your family in attendance may also be held.  Expected length of stay: 10-14 days   Overall anticipated outcome: Minimal Assistance  Depending on your progress and recovery, your program may change. Your Social Worker will coordinate services and will keep you informed of any changes. Your Social Worker's name and contact numbers are listed  below.  The following services may also be recommended but are not provided by the [facility] will be made to provide these services after discharge if needed.  Arrangements include referral to agencies that provide these services.  Your insurance has been verified to be:  Medicare A/B and Medicaid  Your primary doctor is:  Nathanial Rancher  Pertinent information will be  shared with your doctor and your insurance company.  Social Worker:  Loralee Pacas  Information discussed with and copy given to patient by: Rana Snare, 02/06/2020, 10:28 AM

## 2020-02-06 NOTE — Progress Notes (Signed)
Nutrition Follow-up  DOCUMENTATION CODES:   Not applicable  INTERVENTION:  Continue with Nocturnal tube feeding: Nepro at 50 ml/hr x 12 hours Pro-Stat 30 mL BID Provides 1280 kcals, 79 g of protein and 438 mL of free water Meets 50% calorie needs, 70% protein needs Follow po intake and make adjustments as needed  Continue Nepro Shake po BID, each supplement provides 425 kcal and 19 grams protein  Continue Rena-Vit  NUTRITION DIAGNOSIS:   Increased nutrient needs related to acute illness, chronic illness as evidenced by estimated needs.  Ongoing.   GOAL:   Patient will meet greater than or equal to 90% of their needs  Progressing.  MONITOR:   PO intake, Supplement acceptance, Labs, Weight trends, TF tolerance  REASON FOR ASSESSMENT:   Consult Enteral/tube feeding initiation and management, Assessment of nutrition requirement/status  ASSESSMENT:   54 yo male admitted to CIR from [facility] after prolonged hospitalization post cardiac arrest on 11/16/2019 requiring trach and PEG. PMH includes ESRD on HD, CHF, HTN, cocaine abuse, medical non-adherence.   12/2 Admit to [name] post [facility]iac Arrest 1/08 Discharged and Admitted with [facility] 1/25 G tube placed 2/17 De-Cannulated  2/19 Admit to CIR, Renal diet initiated 2/20 Diet downgraded to Dysphagia 3, Nectar  RD attempted to meet with pt, pt unable to stay awake during RD interview and unable to answer RD questions.  RN reached out to RD to clarify orders as pt has orders for PO diet and tube feeding. Over the weekend, RD put in orders for pt to receive nocturnal tube feeding to help meet calorie/protein needs. Per RN, this has not been given the last two nights (first night MD instructed to hold and second night pt refused); per RN, pt has only been receiving PO Nepro shake (ordered for BID) and PO diet. Discussed with RN and since pt has been so drowsy today and not taking much PO, will continue with plan for  nocturnal feedings in hopes that pt accepts. RD will continue to follow and monitor PO intake and will discontinue nocturnal feeding if/when PO intake is consistently adequate.  Current tube feeding orders: 150ml free water BID, Nepro at 50 ml/hr x 12 hours, Pro-Stat 30 mL BID  PO intake: 15-100% x last 7 recorded meals (67% average intake)   Medications reviewed and include: Vitamin C, Nepro BID, Folvite, rena-vit, deltasone, thiamine  Labs reviewed: Na 133 (L), K+ 5.5 (H)  Old outpatient EDW prior to admission in December was 74.5 kg; Current weight 62.6 kg; unsure of EDW at this time.   Dry gangrene bilateral feet  Diet Order:   Diet Order            DIET DYS 3 Room service appropriate? Yes; Fluid consistency: Nectar Thick  Diet effective now              EDUCATION NEEDS:   Not appropriate for education at this time  Skin:  Skin Assessment: Skin Integrity Issues: Skin Integrity Issues:: Other (Comment) Other: bilateral feet with dry gangrene  Last BM:  2/20  Height:   Ht Readings from Last 1 Encounters:  12/17/19 5' 10.5" (1.791 m)    Weight:   Wt Readings from Last 1 Encounters:  02/06/20 65.9 kg    Ideal Body Weight:     BMI:  Body mass index is 20.55 kg/m.  Estimated Nutritional Needs:   Kcal:  2200-2400 kcals  Protein:  110-120 g  Fluid:  1000 mL plus UOP  [name] Ina, MS, RD, LDN RD pager number and weekend/on-call pager number located in [place].

## 2020-02-06 NOTE — Progress Notes (Signed)
[facility] PHYSICAL MEDICINE & REHABILITATION PROGRESS NOTE  Subjective/Complaints: Up in chair trying to eat. Not much appetite. Wants "ten ice chips." anxiety noted over weekend. Occasional associated sob.   ROS: Limited due to cognitive/behavioral    Objective: Vital Signs: Blood pressure 113/88, pulse 86, temperature (!) 97.2 F (36.2 C), resp. rate 18, weight 64.7 kg, SpO2 100 %. DG Chest 2 View  Result Date: 02/05/2020 CLINICAL DATA:  Shortness of breath and cough EXAM: CHEST - 2 VIEW COMPARISON:  Chest radiograph dated 01/12/2020 FINDINGS: The cardiac silhouette is obscured but appears unchanged. Vascular calcifications are seen in the aortic arch. Bibasilar atelectasis/airspace disease appears similar to prior exam. Small bilateral pleural effusions are noted. Diffuse bilateral interstitial opacities likely represent pulmonary edema. There is no pneumothorax. IMPRESSION: Cardiomegaly and pulmonary edema. Small bilateral pleural effusions with associated atelectasis/airspace disease may represent pneumonia. Electronically Signed   By: Zerita Boers M.D.   On: 02/05/2020 12:16   Recent Labs    02/05/20 1748  WBC 12.1*  HGB 13.2  HCT 42.7  PLT 120*   Recent Labs    02/04/20 1231 02/05/20 1748  NA 134* 136  K 4.1 4.7  CL 91* 96*  CO2 27 22  GLUCOSE 103* 124*  BUN 63* 101*  CREATININE 4.59* 6.01*  CALCIUM 8.9 9.1    Physical Exam: BP 113/88   Pulse 86   Temp (!) 97.2 F (36.2 C)   Resp 18   Wt 64.7 kg   SpO2 100%   BMI 20.18 kg/m  Constitutional: No distress . Vital signs reviewed. lethargic HEENT: EOMI, oral membranes moist Neck: trach stoma closed, dysphonic Cardiovascular: RRR without murmur. No JVD    Respiratory: CTA Bilaterally without wheezes or rales. Normal effort    GI: BS +, non-tender, non-distended, PEG intact Skin: See above. Bilateral toes with gangrene--no changes Psych: flat, delayed processing Musc: No edema in extremities.  No  tenderness in extremities. Neurological: awake, slow to process.  Motor: 3+-4 -/5 moves all 4's. Fed himself.   Assessment/Plan: 1. Functional deficits secondary to encephalopathy which require 3+ hours per day of interdisciplinary therapy in a comprehensive inpatient rehab setting.  Physiatrist is providing close team supervision and 24 hour management of active medical problems listed below.  Physiatrist and rehab team continue to assess barriers to discharge/monitor patient progress toward functional and medical goals  Care Tool:  Bathing  Bathing activity did not occur: Refused Body parts bathed by patient: Right arm, Left arm, Chest, Abdomen, Front perineal area, Right lower leg, Left lower leg, Buttocks, Right upper leg, Left upper leg   Body parts bathed by helper: (everything but face)     Bathing assist Assist Level: Total Assistance - Patient < 25%     Upper Body Dressing/Undressing Upper body dressing   What is the patient wearing?: Pull over shirt    Upper body assist Assist Level: Minimal Assistance - Patient > 75%    Lower Body Dressing/Undressing Lower body dressing      What is the patient wearing?: Pants, Underwear/pull up     Lower body assist Assist for lower body dressing: Total Assistance - Patient < 25%     Toileting Toileting    Toileting assist Assist for toileting: Moderate Assistance - Patient 50 - 74%     Transfers Chair/bed transfer  Transfers assist     Chair/bed transfer assist level: Moderate Assistance - Patient 50 - 74%     Locomotion Ambulation   Ambulation assist  Assist level: Minimal Assistance - Patient > 75% Assistive device: Hand held assist Max distance: 25   Walk 10 feet activity   Assist     Assist level: Minimal Assistance - Patient > 75% Assistive device: Walker-rolling   Walk 50 feet activity   Assist    Assist level: Minimal Assistance - Patient > 75% Assistive device: Walker-rolling     Walk 150 feet activity   Assist Walk 150 feet activity did not occur: Safety/medical concerns         Walk 10 feet on uneven surface  activity   Assist Walk 10 feet on uneven surfaces activity did not occur: Safety/medical concerns         Wheelchair     Assist Will patient use wheelchair at discharge?: Yes Type of Wheelchair: Manual    Wheelchair assist level: Minimal Assistance - Patient > 75% Max wheelchair distance: 75    Wheelchair 50 feet with 2 turns activity    Assist        Assist Level: Minimal Assistance - Patient > 75%   Wheelchair 150 feet activity     Assist     Assist Level: Moderate Assistance - Patient 50 - 74%      Medical Problem List and Plan: 1.  Deficits with mobility, endurance, self-care secondary to encephalopathy with debility.  -Continue CIR therapies including PT, OT, and SLP  2.  Antithrombotics: -PE/anticoagulation:  Pharmaceutical: Other (comment)--on low dose Eliquis             -antiplatelet therapy:  N/A 3. Pain Management: Oxycodone or tramadol prn 4. Mood: LCSW to follow for evaluation and support.    See #14             -antipsychotic agents: N/A   See #14 5. Neuropsych: This patient is not fully capable of making decisions on his own behalf. 6. Skin/Wound Care: Dry dressing to bilateral feet.              Monitor stoma for healing 7. Fluids/Electrolytes/Nutrition: Strict I/O.   8. A fib/NSVT: On amiodarone and metoprolol for rate control.              Rate controlled on 2/22  Monitor with increased activity. 9.  ESRD: HD MWF at the end of the day to help with activity tolerance.   -volume/weight mgt per nephrology [facility] Weights   02/04/20 0440 02/05/20 0339 02/06/20 0309  Weight: 62.6 kg 63.1 kg 64.7 kg  10. Pericardial effusion: Has been on colchicine since 1/19 and has been weaned to prednisone 10 mg daily. Last echo 1/29 shows decrease to small effusion. 11. Hypotension: Continue midodrine  10 mg tid.  Relatively controlled on 2/21             Monitor with increased mobility 12. Dysphagia:   Tolerating Nephro    S/p PEG             Advanced to regular thins, downgraded again to D3 nectars after evaluation on 2/21.   2/22--dc reglan today and observe for TF tolerance 13. Dry gangrene bilateral feet: Needs to wear shoes for protection. Dry dressing. Maintain adequate nutritional status.  continue to Monitor demarcation/healing. 14. Anxiety/depression: On Zoloft 25 mg/day.  Team support.  ECG with prolonged QT  Continue melatonin  Klonopin increased to 1 mg 3 times daily and at bedtime-likely lethargic partly d/t this  Will hold off on Seroquel at present due to QTC  2/22 appears calm, over-medicated---monitor for participation  with therapies  LOS: 3 days A FACE TO FACE EVALUATION WAS PERFORMED  Meredith Staggers 02/06/2020, 9:24 AM

## 2020-02-06 NOTE — IPOC Note (Signed)
Overall Plan of Care [facility]) Patient Details Name: [name] [name] MRN: [id number] DOB: 12-24-1965  Admitting Diagnosis: Encephalopathy  Hospital Problems: Principal Problem:   Encephalopathy Active Problems:   Debility   Lethargy   Sleep disturbance   Generalized anxiety disorder   Dysphagia   Somnolence     Functional Problem List: Nursing Behavior, Endurance, Medication Management, Motor, Nutrition, Pain, Perception, Safety, Skin Integrity  PT Balance, Behavior, Edema, Endurance, Motor, Nutrition, Pain, Perception, Safety, Sensory, Skin Integrity  OT Balance, Cognition, Behavior, Endurance, Motor, Safety, Sensory, Skin Integrity  SLP Behavior, Cognition, Nutrition  TR         Basic ADL's: OT Grooming, Bathing, Dressing, Toileting, Eating     Advanced  ADL's: OT       Transfers: PT Bed Mobility, Bed to Chair, Car, [employer], Futures trader, Metallurgist: PT Ambulation, Emergency planning/management officer, Stairs     Additional Impairments: OT    SLP Swallowing, Communication, Social Cognition expression Social Interaction, Problem Solving, Attention, Awareness, Memory  TR      Anticipated Outcomes Item Anticipated Outcome  Self Feeding S  Swallowing  Min A   Basic self-care  S  Toileting  S   Bathroom Transfers S  Bowel/Bladder  remain continent of bowel and maintain regular emptying pattern  Transfers  supervision assist with LRAD .  Locomotion  Ambulatory with LRAd at house hold level  Communication  Min A  Cognition  Min A  Pain  less than 4  Safety/Judgment  remain free of falls, infection and skin breakdown   Therapy Plan: PT Intensity: Minimum of 1-2 x/day ,45 to 90 minutes PT Frequency: 5 out of 7 days PT Duration Estimated Length of Stay: 10-14 days OT Intensity: Minimum of 1-2 x/day, 45 to 90 minutes OT Frequency: 5 out of 7 days OT Duration/Estimated Length of Stay: 7-10 SLP Intensity: Minumum of 1-2 x/day, 30 to 90 minutes SLP  Frequency: 1 to 3 out of 7 days SLP Duration/Estimated Length of Stay: 7-10 days   Due to the current state of emergency, patients may not be receiving their 3-hours of Medicare-mandated therapy.   Team Interventions: Nursing Interventions Patient/Family Education, Disease Management/Prevention, Skin Care/Wound Management, Medication Management, Discharge Planning, Psychosocial Support  PT interventions Ambulation/gait training, Discharge planning, Functional mobility training, Psychosocial support, Therapeutic Activities, Visual/perceptual remediation/compensation, Wheelchair propulsion/positioning, Therapeutic Exercise, Skin care/wound management, Neuromuscular re-education, Disease management/prevention, Medical illustrator training, Cognitive remediation/compensation, Pain management, DME/adaptive equipment instruction, UE/LE Strength taining/ROM, Splinting/orthotics, UE/LE Coordination activities, Stair training, Patient/family education, Functional electrical stimulation, Community reintegration  OT Interventions Training and development officer, Discharge planning, Pain management, Self Care/advanced ADL retraining, Therapeutic Activities, UE/LE Coordination activities, Visual/perceptual remediation/compensation, Therapeutic Exercise, Skin care/wound managment, Patient/family education, Functional mobility training, Disease mangement/prevention, Cognitive remediation/compensation, Academic librarian, Engineer, drilling, Neuromuscular re-education, Psychosocial support, Splinting/orthotics, UE/LE Strength taining/ROM, Wheelchair propulsion/positioning  SLP Interventions Cognitive remediation/compensation, Dysphagia/aspiration precaution training, Speech/Language facilitation, Internal/external aids, Cueing hierarchy, Environmental controls, Therapeutic Activities, Patient/family education, Functional tasks  TR Interventions    SW/CM Interventions     Barriers to Discharge MD   Medical stability  Nursing      PT Medical stability, Home environment access/layout, Trach, Wound Care, Hemodialysis, Medication compliance, Behavior    OT Medical stability    SLP      SW       Team Discharge Planning: Destination: PT-Home ,OT- Home , SLP-Home Projected Follow-up: PT-Home health PT, OT-  Home health OT, SLP-Home Health SLP, 24 hour supervision/assistance Projected Equipment Needs:  PT-Rolling walker with 5" wheels, Wheelchair (measurements), Wheelchair cushion (measurements), OT- 3 in 1 bedside comode, Tub/shower seat, To be determined, SLP-To be determined Equipment Details: PT- , OT-  Patient/family involved in discharge planning: PT- Patient,  OT-Patient, SLP-Patient  MD ELOS: 10-14 days Medical Rehab Prognosis:  Good Assessment: The patient has been admitted for CIR therapies with the diagnosis of encephalopathy/debility. The team will be addressing functional mobility, strength, stamina, balance, safety, adaptive techniques and equipment, self-care, bowel and bladder mgt, patient and caregiver education, NMR, arousal, cognition, communication, swallowing, ego support. Goals have been set at supervision to min assist with self-care and mobility and min assist with cognition and communication, supervision with swallowing..   Due to the current state of emergency, patients may not be receiving their 3 hours per day of Medicare-mandated therapy.    Meredith Staggers, MD, FAAPMR      See Team Conference Notes for weekly updates to the plan of care

## 2020-02-06 NOTE — Progress Notes (Signed)
Patient has been sleepy today. Therapy and dialysis this afternoon. Patient very tired during dialysis. Patient return to floor, BP and RR on the low side, will answer simple questions then go back to sleep. Dan Angiulli aware orders to hold lopressor. Clonazepam, or melatonin tonight.

## 2020-02-06 NOTE — Progress Notes (Signed)
Physical Therapy Note  Patient Details  Name: [name] [name] MRN: [id number] Date of Birth: 12-29-1965 Today's Date: 02/06/2020    Pt's plan of care adjusted to 15/7 after speaking with care team and discussed with MD as pt currently unable to tolerate current therapy schedule with OT, PT, and SLP.     [name] 02/06/2020, 11:24 AM

## 2020-02-06 NOTE — Progress Notes (Signed)
Pt has  drainage from peg tube , drainage is clear thick with spots of blood on the gauze that is around the area.

## 2020-02-06 NOTE — Progress Notes (Signed)
Inpatient Rehabilitation  Patient information reviewed and entered into eRehab system by Anvay Tennis M. Syria Kestner, M.A., CCC/SLP, PPS Coordinator.  Information including medical coding, functional ability and quality indicators will be reviewed and updated through discharge.    

## 2020-02-06 NOTE — Progress Notes (Signed)
[facility] Progress Note   Subjective:  Seen in room - drowsy and hospitalist reports has been like this all weekend. Denies CP/dyspnea. For HD later today.  Objective Vitals:   02/05/20 1948 02/05/20 2022 02/06/20 0309 02/06/20 0735  BP: 104/86  (!) 108/98 113/88  Pulse: 62  88 86  Resp: (!) 22 20 18    Temp: (!) 96.2 F (35.7 C)  (!) 97.2 F (36.2 C)   TempSrc: Axillary     SpO2: 94%  100%   Weight:   64.7 kg    Physical Exam General: Frail man, lethargic. Heart: RRR; no murmur Lungs: TCAB Abdomen: soft, non-tender Extremities: No LE edema Dialysis Access: LUE AVF + thrill  Additional Objective Labs: Basic Metabolic Panel: Recent Labs  Lab 02/01/20 1138 02/01/20 1138 02/03/20 0653 02/04/20 1231 02/05/20 1748  NA 132*   < > 135 134* 136  K 3.4*   < > 4.0 4.1 4.7  CL 91*   < > 93* 91* 96*  CO2 25   < > 24 27 22   GLUCOSE 112*   < > 106* 103* 124*  BUN 78*   < > 78* 63* 101*  CREATININE 5.94*   < > 5.75* 4.59* 6.01*  CALCIUM 9.0   < > 8.9 8.9 9.1  PHOS 2.9  --  2.5  --   --    < > = values in this interval not displayed.   Liver Function Tests: Recent Labs  Lab 02/03/20 0653 02/04/20 1231 02/05/20 1748  AST  --  23 15  ALT  --  26 19  ALKPHOS  --  119 120  BILITOT  --  0.8 0.7  PROT  --  7.2 7.3  ALBUMIN 3.5 3.9 3.7   CBC: Recent Labs  Lab 02/01/20 1138 02/03/20 0653 02/05/20 1748  WBC 9.7 8.2 12.1*  HGB 12.1* 12.3* 13.2  HCT 39.2 40.3 42.7  MCV 89.7 89.2 89.3  PLT 128* 114* 120*   DG Chest 2 View  Result Date: 02/05/2020 CLINICAL DATA:  Shortness of breath and cough EXAM: CHEST - 2 VIEW COMPARISON:  Chest radiograph dated 01/12/2020 FINDINGS: The cardiac silhouette is obscured but appears unchanged. Vascular calcifications are seen in the aortic arch. Bibasilar atelectasis/airspace disease appears similar to prior exam. Small bilateral pleural effusions are noted. Diffuse bilateral interstitial opacities likely represent  pulmonary edema. There is no pneumothorax. IMPRESSION: Cardiomegaly and pulmonary edema. Small bilateral pleural effusions with associated atelectasis/airspace disease may represent pneumonia. Electronically Signed   By: Zerita Boers M.D.   On: 02/05/2020 12:16   Medications:  . apixaban  2.5 mg Per Tube BID  . ascorbic acid  500 mg Per Tube BID  . benztropine  1 mg Per Tube BID  . clonazePAM  1 mg Per Tube TID WC & HS  . colchicine  0.6 mg Per Tube Daily  . feeding supplement (NEPRO CARB STEADY)  1,000 mL Per Tube Q24H  . feeding supplement (NEPRO CARB STEADY)  237 mL Oral BID BM  . feeding supplement (PRO-STAT SUGAR FREE 64)  30 mL Per Tube BID  . folic acid  1 mg Per Tube Daily  . free water  100 mL Per Tube BID  . levothyroxine  25 mcg Per Tube Q0600  . mouth rinse  15 mL Mouth Rinse BID  . Melatonin  3 mg Per Tube QHS  . metoprolol tartrate  25 mg Per Tube TID WC  . midodrine  10 mg  Per Tube TID WC  . multivitamin  1 tablet Per Tube QHS  . predniSONE  10 mg Per Tube Q breakfast  . sertraline  25 mg Per Tube Daily  . thiamine  100 mg Per Tube Daily    Dialysis Orders: Prev orders - will need to change significantly on d/c MWF at [facility], 4:15hr, EDW 74.5kg, 2K/2.5Ca, AVF, heparin 5K - Calcitriol 0.51mcg PO q HD. No ESA or venofer.  Assessment/Plan: 1. Deconditioning: S/p prolonged [facility] admit (12/2-12/23/19) s/p cardiac arrest, then [facility] admit until 2/21. Now CIR. 2. ESRD: Continue HD per MWF schedule. For HD today. 3. HTN/volume: BP ok, currently on metoprolol + midodrine. 3L UFG ordered for HD today 4. Anemia: Hgb 13.2 - no ESA needed. 5. Secondary hyperparathyroidism: Ca/Phos ok. No binders/VDRA. 6. Nutrition/dysphagia: TFs + nectars 7. Hx cardiac arrest  8. Hx PE: On Eliquis 9. A-fib: On Eliquis + amiodarone 10. Hx pericardial effusion -> now on prednisone 10mg  QD. 11.  Anxiety + lethargy: Meds per primary. 12. HFrEF (<20%) 13. Hx resp failure with vent/trach: Trach  decannulated/healed.   Veneta Penton, PA-C 02/06/2020, 10:02 AM  [facility] Pager: [phone]

## 2020-02-06 NOTE — Plan of Care (Signed)
  Problem: Consults Goal: RH GENERAL PATIENT EDUCATION Description: See Patient Education module for education specifics. Outcome: Progressing   Problem: RH BOWEL ELIMINATION Goal: RH STG MANAGE BOWEL WITH ASSISTANCE Description: STG Manage Bowel with Assistance. mod Outcome: Progressing   Problem: RH SKIN INTEGRITY Goal: RH STG SKIN FREE OF INFECTION/BREAKDOWN Description: Free of breakdown and infection with mod assist Outcome: Progressing Goal: RH STG ABLE TO PERFORM INCISION/WOUND CARE W/ASSISTANCE Description: STG Able To Perform Incision/Wound Care With Assistance. Mod Outcome: Progressing   Problem: RH SAFETY Goal: RH STG ADHERE TO SAFETY PRECAUTIONS W/ASSISTANCE/DEVICE Description: STG Adhere to Safety Precautions With Assistance/Device. Mod Outcome: Progressing   Problem: RH PAIN MANAGEMENT Goal: RH STG PAIN MANAGED AT OR BELOW PT'S PAIN GOAL Description: Less than 4 Outcome: Progressing

## 2020-02-07 ENCOUNTER — Inpatient Hospital Stay (HOSPITAL_COMMUNITY): Payer: Medicare Other | Admitting: Occupational Therapy

## 2020-02-07 ENCOUNTER — Inpatient Hospital Stay (HOSPITAL_COMMUNITY): Payer: Medicare Other | Admitting: Speech Pathology

## 2020-02-07 ENCOUNTER — Inpatient Hospital Stay (HOSPITAL_COMMUNITY): Payer: Medicare Other | Admitting: Physical Therapy

## 2020-02-07 MED ORDER — QUETIAPINE FUMARATE 25 MG PO TABS
25.0000 mg | ORAL_TABLET | Freq: Every day | ORAL | Status: DC
  Administered 2020-02-07 – 2020-02-19 (×13): 25 mg via ORAL
  Filled 2020-02-07 (×13): qty 1

## 2020-02-07 MED ORDER — AMIODARONE HCL 200 MG PO TABS
200.0000 mg | ORAL_TABLET | Freq: Every day | ORAL | Status: DC
  Administered 2020-02-07 – 2020-02-12 (×4): 200 mg
  Filled 2020-02-07 (×5): qty 1

## 2020-02-07 MED ORDER — CHLORHEXIDINE GLUCONATE CLOTH 2 % EX PADS
6.0000 | MEDICATED_PAD | Freq: Every day | CUTANEOUS | Status: DC
  Administered 2020-02-07: 6 via TOPICAL

## 2020-02-07 NOTE — Progress Notes (Addendum)
Physical Therapy Session Note  Patient Details  Name: [name] [name] MRN: [id number] Date of Birth: 1966-09-15  Today's Date: 02/07/2020 PT Individual Time: 0349-1791 PT Individual Time Calculation (min): 42 min   Short Term Goals: Week 1:  PT Short Term Goal 1 (Week 1): Pt will transfer to and from [facility] with min assist. PT Short Term Goal 2 (Week 1): Pt will ambulate 45ft with min assist and RW PT Short Term Goal 3 (Week 1): Pt will attend to task >5 mintues with only min cues from PT PT Short Term Goal 4 (Week 1): Pt will performed bed mobility with CGA  Skilled Therapeutic Interventions/Progress Updates:  Pt received in w/c & agreeable to tx. Therapist dons B darco shoes. Pt dons sweatshirt with min assist to pull shirt over back. Sit<>stand with max assist with cuing for hand placement to push up on RW. Gait x 50 ft + 60 ft with RW & min assist with cuing for B shoulder relaxation as they're elevated with poor return demo. Pt with narrow step width BLE and decreased foot clearance that improves with distance. Provided pt with taller RW with improved upright posture. Pt left in w/c with chair alarm donned, call bell in reach. Pt on 2L/min supplemental oxygen via nasal cannula during session. Pt with c/o SOB after gait with therapist educating him on pursed lip breathing. SpO2 remained >90% throughout session. HR ranged 95-100 bpm.   Therapy Documentation Precautions:  Precautions Precautions: Fall Precaution Comments: Peg, Darco shoes Restrictions Weight Bearing Restrictions: No  Pain: Pt c/o BLE being "cold" & B feet tender to touch - nurse reports pt is premedicated.  Therapy/Group: Individual Therapy  [name] [name] 02/07/2020, 4:10 PM

## 2020-02-07 NOTE — Progress Notes (Signed)
Occupational Therapy Session Note  Patient Details  Name: [name] MRN: [id number] Date of Birth: 08/25/1966  Today's Date: 02/07/2020 OT Individual Time: 1100-1130 OT Individual Time Calculation (min): 30 min    Short Term Goals: Week 1:  OT Short Term Goal 1 (Week 1): STG=LTG  Skilled Therapeutic Interventions/Progress Updates:    Patient seated in recliner, cooperative.  On 2L O2 via [place].  Completed sit to stand with min/mod A - able to maintain with CGA, SPT to w/c min A.  Completed UB exercises with cues.  W/c mobility with max A.  Sit to stand from w/c with mod A, completed weight shift activity and returned to sitting position with min A,  He remained seated in w/c with seat belt alarm set, telesitter and call bell/tray table in reach.    Therapy Documentation Precautions:  Precautions Precautions: Fall Precaution Comments: Peg, Darco shoes Restrictions Weight Bearing Restrictions: No General:   Vital Signs: Therapy Vitals Pulse Rate: 97 BP: 99/84 Pain: Pain Assessment Pain Scale: 0-10 Pain Score: 6  Pain Type: Acute pain Pain Location: Foot Pain Orientation: Right;Left Pain Descriptors / Indicators: Discomfort Pain Intervention(s): Repositioned   Therapy/Group: Individual Therapy  Carlos Levering 02/07/2020, 12:09 PM

## 2020-02-07 NOTE — Progress Notes (Addendum)
  EP continues to follow at a distance.  He does not appear to have been on amiodarone since moving back from [facility].  Will resume at 200 mg daily; I am unable to tell if he was getting it there.   He is not currently a candidate for ICD with unclear prognosis, ESRD, and gangrenous toes. Therefore, would not place a Lifevest at this time with no good end-point.     Discussed above with Dr. Rayann Heman.  We will follow up closely as an outpatient.  He is currently scheduled to see me 3/15.  We can move this again if he is still in rehab at that point.    [name] [address]" [place], PA-C  02/07/2020 3:08 PM

## 2020-02-07 NOTE — Progress Notes (Signed)
Speech Language Pathology Daily Session Note  Patient Details  Name: [name] [name] MRN: [id number] Date of Birth: [birth year]  Today's Date: 02/07/2020 SLP Individual Time: 0900-1000 SLP Individual Time Calculation (min): 60 min  Short Term Goals: Week 1: SLP Short Term Goal 1 (Week 1): STGs=LTGs due to ELOS  Skilled Therapeutic Interventions: Skilled treatment session focused on dysphagia and cognitive goals. SLP facilitated session by providing trials of ice chips and thin liquids after oral care. Patient consumed trials of ice chips and controlled sips of thin liquids via cup without overt s/s of aspiration, however, when patient was required to monitor the bolus size, patient with consistent overt s/s of aspiration. Recommend patient continue current diet with trials of thin with SLP. SLP also facilitated session by administering the [place]. Patient scored 25/30 points with a score of 26 or above considered normal with deficits in short-term memory which SLP suspects is baseline. Suspect improved cognitive functioning is due to patient's increased alertness/arousal. Patient also completed a basic money management task with overall supervision level verbal cues. Patient left upright in recliner with alarm on and all needs within reach. Continue with current plan of care.      Pain Pain Assessment Pain Scale: 0-10 Pain Score: 8  Pain Type: Acute pain Pain Location: Foot Pain Orientation: Left;Right Pain Descriptors / Indicators: Aching  Therapy/Group: Individual Therapy  Ellory Khurana 02/07/2020, 10:03 AM

## 2020-02-07 NOTE — Progress Notes (Signed)
[place] PHYSICAL MEDICINE & REHABILITATION PROGRESS NOTE  Subjective/Complaints: Pt came back from HD at first sedated, then he was restless. Pt states he couldn't settle himself. Expressed that he was anxious  ROS: Limited due to cognitive/behavioral    Objective: Vital Signs: Blood pressure 99/84, pulse 97, temperature 97.6 F (36.4 C), resp. rate 20, height 5' 10.5" (1.791 m), weight 62.2 kg, SpO2 100 %. DG Chest 2 View  Result Date: 02/05/2020 CLINICAL DATA:  Shortness of breath and cough EXAM: CHEST - 2 VIEW COMPARISON:  Chest radiograph dated 01/12/2020 FINDINGS: The cardiac silhouette is obscured but appears unchanged. Vascular calcifications are seen in the aortic arch. Bibasilar atelectasis/airspace disease appears similar to prior exam. Small bilateral pleural effusions are noted. Diffuse bilateral interstitial opacities likely represent pulmonary edema. There is no pneumothorax. IMPRESSION: Cardiomegaly and pulmonary edema. Small bilateral pleural effusions with associated atelectasis/airspace disease may represent pneumonia. Electronically Signed   By: Zerita Boers M.D.   On: 02/05/2020 12:16   Recent Labs    02/05/20 1748 02/06/20 1322  WBC 12.1* 10.6*  HGB 13.2 13.1  HCT 42.7 43.1  PLT 120* 151   Recent Labs    02/05/20 1748 02/06/20 1322  NA 136 133*  K 4.7 5.5*  CL 96* 94*  CO2 22 21*  GLUCOSE 124* 146*  BUN 101* 126*  CREATININE 6.01* 6.92*  CALCIUM 9.1 9.1    Physical Exam: BP 99/84   Pulse 97   Temp 97.6 F (36.4 C)   Resp 20   Ht 5' 10.5" (1.791 m)   Wt 62.2 kg   SpO2 100%   BMI 19.40 kg/m  Constitutional: No distress . Vital signs reviewed. HEENT: EOMI, oral membranes moist Neck: supple, trach site closed Cardiovascular: RRR without murmur. No JVD    Respiratory: CTA Bilaterally without wheezes or rales. Normal effort    GI: BS +, non-tender, non-distended, PEG site clean Skin: See above. Bilateral toes with gangrene,  stable Psych:flat, disengaged Musc: No edema in extremities.  No tenderness in extremities. Neurological: more alert. Still slow to process.  Motor: 3+-4 -/5 moves all 4's.     Assessment/Plan: 1. Functional deficits secondary to encephalopathy which require 3+ hours per day of interdisciplinary therapy in a comprehensive inpatient rehab setting.  Physiatrist is providing close team supervision and 24 hour management of active medical problems listed below.  Physiatrist and rehab team continue to assess barriers to discharge/monitor patient progress toward functional and medical goals  Care Tool:  Bathing  Bathing activity did not occur: Refused Body parts bathed by patient: Right arm, Left arm, Chest, Abdomen, Front perineal area, Right lower leg, Left lower leg, Buttocks, Right upper leg, Left upper leg   Body parts bathed by helper: (everything but face)     Bathing assist Assist Level: Total Assistance - Patient < 25%     Upper Body Dressing/Undressing Upper body dressing   What is the patient wearing?: Pull over shirt    Upper body assist Assist Level: Minimal Assistance - Patient > 75%    Lower Body Dressing/Undressing Lower body dressing      What is the patient wearing?: Underwear/pull up, Pants     Lower body assist Assist for lower body dressing: Total Assistance - Patient < 25%     Toileting Toileting    Toileting assist Assist for toileting: Moderate Assistance - Patient 50 - 74%     Transfers Chair/bed transfer  Transfers assist     Chair/bed transfer assist  level: Moderate Assistance - Patient 50 - 74%     Locomotion Ambulation   Ambulation assist      Assist level: Minimal Assistance - Patient > 75% Assistive device: Hand held assist Max distance: 25   Walk 10 feet activity   Assist     Assist level: Minimal Assistance - Patient > 75% Assistive device: Walker-rolling   Walk 50 feet activity   Assist    Assist level:  Minimal Assistance - Patient > 75% Assistive device: Walker-rolling    Walk 150 feet activity   Assist Walk 150 feet activity did not occur: Safety/medical concerns         Walk 10 feet on uneven surface  activity   Assist Walk 10 feet on uneven surfaces activity did not occur: Safety/medical concerns         Wheelchair     Assist Will patient use wheelchair at discharge?: Yes Type of Wheelchair: Manual    Wheelchair assist level: Minimal Assistance - Patient > 75% Max wheelchair distance: 75    Wheelchair 50 feet with 2 turns activity    Assist        Assist Level: Minimal Assistance - Patient > 75%   Wheelchair 150 feet activity     Assist     Assist Level: Moderate Assistance - Patient 50 - 74%      Medical Problem List and Plan: 1.  Deficits with mobility, endurance, self-care secondary to encephalopathy with debility.  -Continue CIR therapies including PT, OT, and SLP   -team conference today 2.  Antithrombotics: -PE/anticoagulation:  Pharmaceutical: Other (comment)--on low dose Eliquis             -antiplatelet therapy:  N/A 3. Pain Management: Oxycodone or tramadol prn 4. Mood: LCSW to follow for evaluation and support.    See #14             -antipsychotic agents: N/A   See #14 5. Neuropsych: This patient is not fully capable of making decisions on his own behalf. 6. Skin/Wound Care: Dry dressing to bilateral feet.              stoma closing. PEG site intact 7. Fluids/Electrolytes/Nutrition:   2/23 stopped TF  -monitor intake.   8. A fib/NSVT: On amiodarone and metoprolol for rate control.              Rate controlled on 2/23  Monitor with increased activity. 9.  ESRD: HD MWF at the end of the day to help with activity tolerance.   -volume/weight mgt per nephrology [facility] Weights   02/06/20 0309 02/06/20 1307 02/06/20 1650  Weight: 64.7 kg 65.9 kg 62.2 kg  10. Pericardial effusion: Has been on colchicine since 1/19 and has  been weaned to prednisone 10 mg daily. Last echo 1/29 shows decrease to small effusion. 11. Hypotension: Continue midodrine 10 mg tid.  Relatively controlled on 2/23             Monitor with increased mobility 12. Dysphagia:   Tolerating Nephro    S/p PEG             Advanced to regular thins, downgraded again to D3 nectars after evaluation on 2/21.   2/22--dc'ed reglan, following for TF tolerance  2/23--stop TF. I think he's capable of sustaining on his own. Ate 100% breakfast today 13. Dry gangrene bilateral feet: Needs to wear shoes for protection. Dry dressing. Maintain adequate nutritional status.  continue to Monitor demarcation/healing. 14. Anxiety/depression: On  Zoloft 25 mg/day.  Team support.  ECG with prolonged QTC 512  Continue melatonin  Klonopin increased to 1 mg 3 times daily and at bedtime-likely lethargic partly d/t this  2/23 Will add low dose seroquel 25mg  for HS confusion/insomnia   -check EKG AM 2/24  LOS: 4 days A FACE TO FACE EVALUATION WAS PERFORMED  Meredith Staggers 02/07/2020, 9:25 AM

## 2020-02-07 NOTE — Plan of Care (Signed)
  Problem: Consults Goal: RH GENERAL PATIENT EDUCATION Description: See Patient Education module for education specifics. Outcome: Progressing   Problem: RH BOWEL ELIMINATION Goal: RH STG MANAGE BOWEL WITH ASSISTANCE Description: STG Manage Bowel with Assistance. mod Outcome: Progressing   Problem: RH SKIN INTEGRITY Goal: RH STG SKIN FREE OF INFECTION/BREAKDOWN Description: Free of breakdown and infection with mod assist Outcome: Progressing Goal: RH STG ABLE TO PERFORM INCISION/WOUND CARE W/ASSISTANCE Description: STG Able To Perform Incision/Wound Care With Assistance. Mod Outcome: Progressing   Problem: RH SAFETY Goal: RH STG ADHERE TO SAFETY PRECAUTIONS W/ASSISTANCE/DEVICE Description: STG Adhere to Safety Precautions With Assistance/Device. Mod Outcome: Progressing   Problem: RH PAIN MANAGEMENT Goal: RH STG PAIN MANAGED AT OR BELOW PT'S PAIN GOAL Description: Less than 4 Outcome: Progressing

## 2020-02-07 NOTE — Progress Notes (Signed)
Pt has been restless and awake all night. Pt continue to try and get out of bed alone, pt educated multiple times by nurses and nurse techs. Pt gets agitated when pt doesn't get their way. Pt encouraged to try and relax and stay calm to help with anxiety.

## 2020-02-07 NOTE — Patient Care Conference (Addendum)
Inpatient RehabilitationTeam Conference and Plan of Care Update Date: 02/07/2020   Time: 10:40 AM    Patient Name: [name] [name]      Medical Record Number: [id number]  Date of Birth: 1966/02/17 Sex: Male         Room/Bed: 4M02C/4M02C-01 Payor Info: Payor: MEDICARE / Plan: MEDICARE PART A AND B / Product Type: *No Product type* /    Admit Date/Time:  02/03/2020  5:40 PM  Primary Diagnosis:  Encephalopathy  Patient Active Problem List   Diagnosis Date Noted  . Lethargy   . Sleep disturbance   . Generalized anxiety disorder   . Dysphagia   . Somnolence   . Encephalopathy   . Debility   . ESRD on dialysis ([place])   . Dry gangrene ([place])   . Hypotension   . S/P percutaneous endoscopic gastrostomy (PEG) tube placement ([place])   . Pericardial effusion   . Atrial fibrillation ([place])   . Acute on chronic respiratory failure with hypoxia ([place])   . End stage renal disease on dialysis ([place])   . Acute on chronic systolic and diastolic heart failure, NYHA class 4 ([place])   . Chronic atrial fibrillation ([place])   . Acute pulmonary embolism without acute cor pulmonale (HCC)   . Tracheostomy status ([place])   . Adult failure to thrive   . Ventilator dependent ([place])   . Chronic respiratory failure with hypoxia ([place])   . Anoxic encephalopathy ([place])   . Tracheostomy in place [facility])   . Endotracheally intubated   . Palliative care by specialist   . Pressure injury of skin 11/19/2019  . Acute respiratory failure ([place])   . ESRD on hemodialysis ([place])   . Cardiac arrest ([place]) 11/16/2019  . Paroxysmal atrial fibrillation (HCC)   . Superficial venous thrombosis of right arm 11/05/2018  . Nonischemic cardiomyopathy ([place]) 11/03/2018  . Essential hypertension 11/03/2018  . ESRD needing dialysis ([place]) 10/26/2018  . Prolonged Q-T interval on ECG 05/18/2017  . Hernia, inguinal, right 08/05/2016  . Acute on chronic systolic CHF (congestive heart failure) ([place])   . Solitary pulmonary nodule 10/10/2015  .  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 Anemia due to blood loss, acute 07/15/2012  . Hypocalcemia 07/15/2012  . Cigarette smoker 11/26/2010    Expected Discharge Date: Expected Discharge Date: 02/21/20  Team Members Present: Physician leading conference: Dr. Alger Simons Social Worker Present: Loralee Pacas, LCSW) Nurse Present: Other (comment)(Amanda Archie, RN) Case Manager: Karene Fry, RN PT Present: Lavone Nian, PT OT Present: Cherylynn Ridges, OT SLP Present: Weston Anna, SLP PPS Coordinator present : Gunnar Fusi, SLP     Current Status/Progress Goal Weekly Team Focus  Bowel/Bladder   pt is continent of B&B, LMB 2/23. pt is anuria  assess B&B qshift and prn  encourage pt to continue to use call light when needing the rest room   Swallow/Nutrition/ Hydration   Dys. 3 textures with nectar-thick liquids, Min-Mod A  Min A  use of swallowing compensatory strategies, trials of thin   ADL's   min-max assist self-care due to lethargy and decreased initiation, min-mod assist sit > stand and stand pivot transfer.  Participation greatly limited by lethargy, impaired initiation, and awareness of impairments  Supervision overall, Min A for cognition  activity tolerance, standing balance, endurance, ADL retraining, functional mobility, safety awareness, pt/family education   Mobility   limited by fatigue but can ambulate up to 50 ft with RW min assist, mod<>max assist sit<>stand, mod assist  stand pivot, max assist sit>supine  supervision overall except CGA bed<>chair & min assist car transfer & stairs with rails per home setup  activity tolerance, transfers, bed mobility, gait, endurance, stair negotiation, balance, d/c planning, pt education   Communication   Mod A  Min A  use of an increased vocal intensity   Safety/Cognition/ Behavioral Observations  Mod A  Min A  recall with use of strategies, problem solving, awareness   Pain   pt  complains of pain in left and right foot, prn meds keep controlled  keep pain under a 3  assess pain qshift an prn   Skin   pt has a pegtube on left side of stomach, some scraps on both knees generlaized bruising LA fistula  assess skin prn and qshift  keep skin from new breakdown    Rehab Goals Patient on target to meet rehab goals: Yes *See Care Plan and progress notes for long and short-term goals.     Barriers to Discharge  Current Status/Progress Possible Resolutions Date Resolved   Nursing                  PT  Medical stability;Home environment access/layout;Wound Care;Hemodialysis;Behavior;New oxygen;Lack of/limited family support  steps without rails to enter home, unsure if father can provide physical assist at d/c as he already assists his wife              OT                  SLP                SW Lack of/limited family support;Medical stability;Decreased caregiver support [name] transports him to/from dialysis, as well as his mother. Limited caregiver support.            Discharge Planning/Teaching Needs:  D/C to home with his parents  Family education as recommended by therapt   Team Discussion: 54 yo with ESRD, polysubstance abuse, PEA, cardiac arrest, anoxic/metabolic encephalopathy, PEG for dysphagia, more confusion at night, meds adjusted, need to limit pain meds.  RN up in chair, agitated more easily, taking off clothes, fidgety, is taking his meds, BM today.  OT restless, min/mod ADLs, min/mod to stand, S goals, min A cognition goals.  PT lethargic yesterday, mod/max transfers, min A 50', goals min a stairs and car, CGA goals for standing and walking.  SLP better today, family manages patient, downgrade to nectar thick, coughing today.   Revisions to Treatment Plan: N/A     Medical Summary Current Status: PEA cardiac arrest with subsequent multiple medical issues, debility, encephalopathy. has ongoing confusion, waxin and waning cognitive statues Weekly  Focus/Goal: improve sleep/wake and cognition. maximize nutrition.  Barriers to Discharge: Behavior;Medical stability   Possible Resolutions to Barriers: see medical progress notes   Continued Need for Acute Rehabilitation Level of Care: The patient requires daily medical management by a physician with specialized training in physical medicine and rehabilitation for the following reasons: Direction of a multidisciplinary physical rehabilitation program to maximize functional independence : Yes Medical management of patient stability for increased activity during participation in an intensive rehabilitation regime.: Yes Analysis of laboratory values and/or radiology reports with any subsequent need for medication adjustment and/or medical intervention. : Yes   I attest that I was present, lead the team conference, and concur with the assessment and plan of the team.   Jodell Cipro M 02/09/2020, 10:19 AM   Team conference was held via web/ teleconference  due to COVID - 19

## 2020-02-07 NOTE — Progress Notes (Signed)
Occupational Therapy Session Note  Patient Details  Name: [name] [name] MRN: [id number] Date of Birth: 1966/01/10  Today's Date: 02/07/2020 OT Individual Time: 2072-1828 OT Individual Time Calculation (min): 44 min   Short Term Goals: Week 1:  OT Short Term Goal 1 (Week 1): STG=LTG  Skilled Therapeutic Interventions/Progress Updates:    Pt greeted sitting in recliner eating breakfast with nurse tech, handoff to OT. Pt able to finish eating breakfast with supervision. Pt able to locate items on all 4 quadrants of tray. Pt reported pain in B feet at 8/10 and declined standing 2/2 pain. OT requested pain meds and nursing notified. Pt agreeable to wash face and brush teeth from recliner. Recliner pushed to the sink with pt able to reach forward to turn on water and complete grooming tasks with supervision. Nursing then entered to administer medication. OT discussed PLOF and activities pt enjoyed previous to hospital stay. Pt reported he enjoyed bowling. OT educated pt on Wii Bowling activity and pt thought this would be enjoyable. OT will try to play Wii bowling with pt at next session. Pt left seated in recliner with call bell in reach and needs met.   Therapy Documentation Precautions:  Precautions Precautions: Fall Precaution Comments: Peg, [place] shoes Restrictions Weight Bearing Restrictions: No Vital Signs: Therapy Vitals Pulse Rate: 97 BP: 99/84 Pain: Pain Assessment Pain Scale: 0-10 Pain Score: 8  Pain Type: Acute pain Pain Location: Foot Pain Orientation: Left;Right Pain Descriptors / Indicators: Aching   Therapy/Group: Individual Therapy  Valma Cava 02/07/2020, 8:28 AM

## 2020-02-07 NOTE — Progress Notes (Signed)
[facility] Progress Note   Subjective:   Seen in room - more awake today despite reporting poor sleep. Denies CP or dyspnea.  Objective Vitals:   02/06/20 2234 02/06/20 2235 02/07/20 0302 02/07/20 0813  BP: 91/76  102/78 99/84  Pulse: 89 89 91 97  Resp:      Temp:   97.6 F (36.4 C)   TempSrc:      SpO2:  97% 100%   Weight:      Height:       Physical Exam General: Frail man, more awake today. Heart: RRR; no murmur Lungs: Clear in upper lobes, faint crackles in L base Abdomen: soft, non-tender. PEG in place Extremities: No LE edema Dialysis Access: LUE AVF + thrill  Additional Objective Labs: Basic Metabolic Panel: Recent Labs  Lab 02/01/20 1138 02/01/20 1138 02/03/20 0653 02/03/20 0653 02/04/20 1231 02/05/20 1748 02/06/20 1322  NA 132*   < > 135   < > 134* 136 133*  K 3.4*   < > 4.0   < > 4.1 4.7 5.5*  CL 91*   < > 93*   < > 91* 96* 94*  CO2 25   < > 24   < > 27 22 21*  GLUCOSE 112*   < > 106*   < > 103* 124* 146*  BUN 78*   < > 78*   < > 63* 101* 126*  CREATININE 5.94*   < > 5.75*   < > 4.59* 6.01* 6.92*  CALCIUM 9.0   < > 8.9   < > 8.9 9.1 9.1  PHOS 2.9  --  2.5  --   --   --  4.0   < > = values in this interval not displayed.   Liver Function Tests: Recent Labs  Lab 02/04/20 1231 02/05/20 1748 02/06/20 1322  AST 23 15  --   ALT 26 19  --   ALKPHOS 119 120  --   BILITOT 0.8 0.7  --   PROT 7.2 7.3  --   ALBUMIN 3.9 3.7 3.8   CBC: Recent Labs  Lab 02/01/20 1138 02/01/20 1138 02/03/20 0653 02/05/20 1748 02/06/20 1322  WBC 9.7   < > 8.2 12.1* 10.6*  HGB 12.1*   < > 12.3* 13.2 13.1  HCT 39.2   < > 40.3 42.7 43.1  MCV 89.7  --  89.2 89.3 89.0  PLT 128*   < > 114* 120* 151   < > = values in this interval not displayed.   Studies/Results: DG Chest 2 View  Result Date: 02/05/2020 CLINICAL DATA:  Shortness of breath and cough EXAM: CHEST - 2 VIEW COMPARISON:  Chest radiograph dated 01/12/2020 FINDINGS: The cardiac silhouette  is obscured but appears unchanged. Vascular calcifications are seen in the aortic arch. Bibasilar atelectasis/airspace disease appears similar to prior exam. Small bilateral pleural effusions are noted. Diffuse bilateral interstitial opacities likely represent pulmonary edema. There is no pneumothorax. IMPRESSION: Cardiomegaly and pulmonary edema. Small bilateral pleural effusions with associated atelectasis/airspace disease may represent pneumonia. Electronically Signed   By: Zerita Boers M.D.   On: 02/05/2020 12:16   Medications:  . apixaban  2.5 mg Per Tube BID  . ascorbic acid  500 mg Per Tube BID  . benztropine  1 mg Per Tube BID  . clonazePAM  1 mg Per Tube TID WC & HS  . colchicine  0.6 mg Per Tube Daily  . feeding supplement (NEPRO CARB STEADY)  1,000 mL  Per Tube Q24H  . feeding supplement (NEPRO CARB STEADY)  237 mL Oral BID BM  . feeding supplement (PRO-STAT SUGAR FREE 64)  30 mL Per Tube BID  . folic acid  1 mg Per Tube Daily  . free water  100 mL Per Tube BID  . levothyroxine  25 mcg Per Tube Q0600  . mouth rinse  15 mL Mouth Rinse BID  . Melatonin  3 mg Per Tube QHS  . metoprolol tartrate  25 mg Per Tube TID WC  . midodrine  10 mg Per Tube TID WC  . multivitamin  1 tablet Per Tube QHS  . predniSONE  10 mg Per Tube Q breakfast  . sertraline  25 mg Per Tube Daily  . thiamine  100 mg Per Tube Daily    Dialysis Orders: Prev orders - will need to change significantly on d/c MWF at [facility], 4:15hr, EDW 74.5kg, 2K/2.5Ca, AVF, heparin 5K - Calcitriol 0.93mcg PO q HD. No ESA or venofer.  Assessment/Plan: 1. Deconditioning: S/p prolonged [facility] admit (12/2-12/23/19) s/p cardiac arrest, then [facility] admit until 2/21. Now CIR. 2. ESRD: Continue HD per MWF schedule. Next HD 2/24. 3. HTN/volume: BP ok, currently on metoprolol + midodrine. 4. Anemia: Hgb 13.2 - no ESA needed. 5. Secondary hyperparathyroidism: Ca/Phos ok. No binders/VDRA. 6. Nutrition/dysphagia: TFs + nectars 7. Hx cardiac  arrest  8. Hx PE: On Eliquis 9. A-fib: On Eliquis + amiodarone 10. Hx pericardial effusion -> now on prednisone 10mg  QD. 11.  Anxiety + lethargy: Meds per primary. 12. HFrEF (<20%) 13. Hx resp failure with vent/trach: Trach decannulated/healed.   Veneta Penton, PA-C 02/07/2020, 9:47 AM  [facility] Pager: [phone]

## 2020-02-07 NOTE — Progress Notes (Signed)
Patients son brought him 114.00 dollars today. The Nurse tech Fransisco Beau took it and security lock it up for safe keeping. Patient is aware and voice mail left for son on money where about's and that he can pick it back up if warranted.  Patient continues to be upset about wanting the money so he order take out today. Patient educated on dysphagia diet, thicken liquids, and specialized renal diet. Patient verbalized understanding at the time, but a few minutes later he repeated the same conversation with this Probation officer. Will continue to educate patient on diet and meals in the hospital as needed.

## 2020-02-07 NOTE — Progress Notes (Signed)
Patient is having pain to left foot 10/10. Writer spoke with Marlowe Sh[place]. PA who stated to give a now dose of tramadol since it is due about 1830 anyway.

## 2020-02-08 ENCOUNTER — Inpatient Hospital Stay (HOSPITAL_COMMUNITY): Payer: Medicare Other

## 2020-02-08 ENCOUNTER — Inpatient Hospital Stay (HOSPITAL_COMMUNITY): Payer: Medicare Other | Admitting: Physical Therapy

## 2020-02-08 ENCOUNTER — Inpatient Hospital Stay (HOSPITAL_COMMUNITY): Payer: Medicare Other | Admitting: Speech Pathology

## 2020-02-08 LAB — RENAL FUNCTION PANEL
Albumin: 3.7 g/dL (ref 3.5–5.0)
Anion gap: 20 — ABNORMAL HIGH (ref 5–15)
BUN: 100 mg/dL — ABNORMAL HIGH (ref 6–20)
CO2: 26 mmol/L (ref 22–32)
Calcium: 9.4 mg/dL (ref 8.9–10.3)
Chloride: 89 mmol/L — ABNORMAL LOW (ref 98–111)
Creatinine, Ser: 6.25 mg/dL — ABNORMAL HIGH (ref 0.61–1.24)
GFR calc Af Amer: 11 mL/min — ABNORMAL LOW (ref >60)
GFR calc non Af Amer: 9 mL/min — ABNORMAL LOW (ref >60)
Glucose, Bld: 108 mg/dL — ABNORMAL HIGH (ref 70–99)
Phosphorus: 3.6 mg/dL (ref 2.5–4.6)
Potassium: 4.8 mmol/L (ref 3.5–5.1)
Sodium: 135 mmol/L (ref 135–145)

## 2020-02-08 LAB — CBC
HCT: 40.4 % (ref 39.0–52.0)
Hemoglobin: 12.3 g/dL — ABNORMAL LOW (ref 13.0–17.0)
MCH: 27.2 pg (ref 26.0–34.0)
MCHC: 30.4 g/dL (ref 30.0–36.0)
MCV: 89.4 fL (ref 80.0–100.0)
Platelets: 143 10*3/uL — ABNORMAL LOW (ref 150–400)
RBC: 4.52 MIL/uL (ref 4.22–5.81)
RDW: 21.2 % — ABNORMAL HIGH (ref 11.5–15.5)
WBC: 10.2 10*3/uL (ref 4.0–10.5)
nRBC: 0 % (ref 0.0–0.2)

## 2020-02-08 MED ORDER — GABAPENTIN 250 MG/5ML PO SOLN
100.0000 mg | Freq: Three times a day (TID) | ORAL | Status: DC
  Administered 2020-02-08 – 2020-02-11 (×10): 100 mg
  Filled 2020-02-08 (×15): qty 2

## 2020-02-08 MED ORDER — MIDODRINE HCL 5 MG PO TABS
ORAL_TABLET | ORAL | Status: AC
  Filled 2020-02-08: qty 1

## 2020-02-08 NOTE — Progress Notes (Signed)
Pt states chest pain is gone.  Resting in bed call light within reach

## 2020-02-08 NOTE — Progress Notes (Signed)
[facility] Progress Note   Subjective:  Seen in room - drowsy again and breathing heavily. Just moved from bed to chair - unclear how much role this is playing. Denies CP or other complaints today. For HD later today.   Objective Vitals:   02/07/20 2000 02/08/20 0500 02/08/20 0502 02/08/20 0950  BP: (!) 116/102  106/84 105/79  Pulse: (!) 105  (!) 49 91  Resp:   18   Temp: 97.8 F (36.6 C)  97.9 F (36.6 C)   TempSrc:      SpO2: 100%  98%   Weight:  67.3 kg    Height:       Physical Exam General:Frail man, slightly lethargic again Heart:RRR; no murmur Lungs: CTA throughout, but slight work of breathing today. Abdomen:soft, non-tender. PEG in place Extremities:No LE edema Dialysis Access:LUE AVF + thrill   Additional Objective Labs: Basic Metabolic Panel: Recent Labs  Lab 02/01/20 1138 02/01/20 1138 02/03/20 0653 02/03/20 0653 02/04/20 1231 02/05/20 1748 02/06/20 1322  NA 132*   < > 135   < > 134* 136 133*  K 3.4*   < > 4.0   < > 4.1 4.7 5.5*  CL 91*   < > 93*   < > 91* 96* 94*  CO2 25   < > 24   < > 27 22 21*  GLUCOSE 112*   < > 106*   < > 103* 124* 146*  BUN 78*   < > 78*   < > 63* 101* 126*  CREATININE 5.94*   < > 5.75*   < > 4.59* 6.01* 6.92*  CALCIUM 9.0   < > 8.9   < > 8.9 9.1 9.1  PHOS 2.9  --  2.5  --   --   --  4.0   < > = values in this interval not displayed.   Liver Function Tests: Recent Labs  Lab 02/04/20 1231 02/05/20 1748 02/06/20 1322  AST 23 15  --   ALT 26 19  --   ALKPHOS 119 120  --   BILITOT 0.8 0.7  --   PROT 7.2 7.3  --   ALBUMIN 3.9 3.7 3.8   CBC: Recent Labs  Lab 02/01/20 1138 02/01/20 1138 02/03/20 0653 02/05/20 1748 02/06/20 1322  WBC 9.7   < > 8.2 12.1* 10.6*  HGB 12.1*   < > 12.3* 13.2 13.1  HCT 39.2   < > 40.3 42.7 43.1  MCV 89.7  --  89.2 89.3 89.0  PLT 128*   < > 114* 120* 151   < > = values in this interval not displayed.   Medications:  . amiodarone  200 mg Per Tube Daily  .  apixaban  2.5 mg Per Tube BID  . ascorbic acid  500 mg Per Tube BID  . benztropine  1 mg Per Tube BID  . clonazePAM  1 mg Per Tube TID WC & HS  . colchicine  0.6 mg Per Tube Daily  . feeding supplement (NEPRO CARB STEADY)  237 mL Oral BID BM  . feeding supplement (PRO-STAT SUGAR FREE 64)  30 mL Per Tube BID  . folic acid  1 mg Per Tube Daily  . free water  100 mL Per Tube BID  . gabapentin  100 mg Per Tube Q8H  . levothyroxine  25 mcg Per Tube Q0600  . mouth rinse  15 mL Mouth Rinse BID  . Melatonin  3 mg Per Tube QHS  .  metoprolol tartrate  25 mg Per Tube TID WC  . midodrine  10 mg Per Tube TID WC  . multivitamin  1 tablet Per Tube QHS  . predniSONE  10 mg Per Tube Q breakfast  . QUEtiapine  25 mg Oral QHS  . thiamine  100 mg Per Tube Daily    Dialysis Orders: Prev orders - will need to change significantly on d/c MWF at [facility], 4:15hr, EDW 74.5kg, 2K/2.5Ca, AVF, heparin 5K - Calcitriol 0.45mcg PO q HD. No ESA or venofer.  Assessment/Plan: 1.Deconditioning: S/p prolonged [facility] admit (12/2-12/23/19) s/p cardiac arrest, then [facility] until 2/21. Now CIR. 2. ESRD:Continue HD per MWF schedule. For HD today - may need to take him a little earlier than scheduled d/t heavy breathing - will d/w HD staff. Will order CXR as well. 3. HTN/volume:BP ok, currently on metoprolol + midodrine. 4. Anemia:Hgb 13.1 - no ESA needed. 5. Secondary hyperparathyroidism:Ca/Phos ok. No binders/VDRA. 6. Nutrition/dysphagia: TFs + nectars 7. Hx cardiac arrest  8. Hx PE: On Eliquis 9. A-fib: On Eliquis + amiodarone 10. Hx pericardial effusion -> now on prednisone 10mg  QD. 11. Anxiety + lethargy: Meds per primary. 12. HFrEF (<20%) 13. Hx resp failure with vent/trach: Trach decannulated/healed.  Veneta Penton, PA-C 02/08/2020, 10:30 AM  [facility] Pager: [phone]

## 2020-02-08 NOTE — Progress Notes (Signed)
[facility] MEDICINE [facility] REHABILITATION PROGRESS NOTE  Subjective/Complaints: As per RN, patient had chest pain overnight that self-resolved. He denies chest pain this morning.  He has no complaints this morning. Therapist notes he has increased sensitivity in gangrenous toes that improves when he wears socks.   ROS: Limited due to cognitive/behavioral    Objective: Vital Signs: Blood pressure 106/84, pulse (!) 49, temperature 97.9 F (36.6 C), resp. rate 18, height 5' 10.5" (1.791 m), weight 67.3 kg, SpO2 98 %. No results found. Recent Labs    02/05/20 1748 02/06/20 1322  WBC 12.1* 10.6*  HGB 13.2 13.1  HCT 42.7 43.1  PLT 120* 151   Recent Labs    02/05/20 1748 02/06/20 1322  NA 136 133*  K 4.7 5.5*  CL 96* 94*  CO2 22 21*  GLUCOSE 124* 146*  BUN 101* 126*  CREATININE 6.01* 6.92*  CALCIUM 9.1 9.1    Physical Exam: BP 106/84 (BP Location: Right Arm)   Pulse (!) 49   Temp 97.9 F (36.6 C)   Resp 18   Ht 5' 10.5" (1.791 m)   Wt 67.3 kg   SpO2 98%   BMI 20.99 kg/m  Constitutional: No distress . Vital signs reviewed. Sitting up at bedside trying to remove pants, working with OT HEENT: EOMI, oral membranes moist Neck: supple, trach site closed Cardiovascular: RRR without murmur. No JVD    Respiratory: CTA Bilaterally without wheezes or rales. Normal effort    GI: BS +, non-tender, non-distended, PEG site clean Skin: See above. Bilateral toes with gangrene, stable Psych:flat, disengaged Musc: No edema in extremities.  No tenderness in extremities. Neurological: more alert. Still slow to process.  Motor: 3+-4 -/5 moves all 4's.    Assessment/Plan: 1. Functional deficits secondary to encephalopathy which require 3+ hours per day of interdisciplinary therapy in a comprehensive inpatient rehab setting.  Physiatrist is providing close team supervision and 24 hour management of active medical problems listed below.  Physiatrist and rehab team continue  to assess barriers to discharge/monitor patient progress toward functional and medical goals  Care Tool:  Bathing  Bathing activity did not occur: Refused Body parts bathed by patient: Right arm, Left arm, Chest, Abdomen, Front perineal area, Right lower leg, Left lower leg, Buttocks, Right upper leg, Left upper leg   Body parts bathed by helper: (everything but face)     Bathing assist Assist Level: Total Assistance - Patient < 25%     Upper Body Dressing/Undressing Upper body dressing   What is the patient wearing?: Pull over shirt    Upper body assist Assist Level: Minimal Assistance - Patient > 75%    Lower Body Dressing/Undressing Lower body dressing      What is the patient wearing?: Underwear/pull up, Pants     Lower body assist Assist for lower body dressing: Total Assistance - Patient < 25%     Toileting Toileting    Toileting assist Assist for toileting: Moderate Assistance - Patient 50 - 74%     Transfers Chair/bed transfer  Transfers assist     Chair/bed transfer assist level: Moderate Assistance - Patient 50 - 74%     Locomotion Ambulation   Ambulation assist      Assist level: Minimal Assistance - Patient > 75% Assistive device: Walker-rolling Max distance: 60 ft   Walk 10 feet activity   Assist     Assist level: Minimal Assistance - Patient > 75% Assistive device: Walker-rolling   Walk 50 feet  activity   Assist    Assist level: Minimal Assistance - Patient > 75% Assistive device: Walker-rolling    Walk 150 feet activity   Assist Walk 150 feet activity did not occur: Safety/medical concerns         Walk 10 feet on uneven surface  activity   Assist Walk 10 feet on uneven surfaces activity did not occur: Safety/medical concerns         Wheelchair     Assist Will patient use wheelchair at discharge?: Yes Type of Wheelchair: Manual    Wheelchair assist level: Minimal Assistance - Patient > 75% Max  wheelchair distance: 75    Wheelchair 50 feet with 2 turns activity    Assist        Assist Level: Minimal Assistance - Patient > 75%   Wheelchair 150 feet activity     Assist     Assist Level: Moderate Assistance - Patient 50 - 74%      Medical Problem List and Plan: 1.  Deficits with mobility, endurance, self-care secondary to encephalopathy with debility.  -Continue CIR therapies including PT, OT, and SLP  2.  Antithrombotics: -PE/anticoagulation:  Pharmaceutical: Other (comment)--on low dose Eliquis             -antiplatelet therapy:  N/A 3. Pain Management: Oxycodone or tramadol prn.   2/24: Therapist notes that Mr. Haran has been having hypersensitivity of his gangrenous toes. Will add low dose Gabapentin 100mg  TID to help with pain-- low dose due to ESRD.  4. Mood: LCSW to follow for evaluation and support.    See #14             -antipsychotic agents: N/A   See #14 5. Neuropsych: This patient is not fully capable of making decisions on his own behalf. 6. Skin/Wound Care: Dry dressing to bilateral feet.              stoma closing. PEG site intact 7. Fluids/Electrolytes/Nutrition:   2/23 stopped TF  -monitor intake.   8. A fib/NSVT: On amiodarone and metoprolol for rate control.              Rate controlled on 2/23.  2/24: Bradycardic to 49 this morning, previously had been in high 80s and 90s with a high of 105.   Monitor with increased activity. 9.  ESRD: HD MWF at the end of the day to help with activity tolerance.   -volume/weight mgt per nephrology Filed Weights   02/06/20 1307 02/06/20 1650 02/08/20 0500  Weight: 65.9 kg 62.2 kg 67.3 kg  10. Pericardial effusion: Has been on colchicine since 1/19 and has been weaned to prednisone 10 mg daily. Last echo 1/29 shows decrease to small effusion. 11. Hypotension: Continue midodrine 10 mg tid.  Relatively controlled on 2/23, 2/24             Monitor with increased mobility 12. Dysphagia:   Tolerating  Nephro    S/p PEG             Advanced to regular thins, downgraded again to D3 nectars after evaluation on 2/21.   2/22--dc'ed reglan, following for TF tolerance  2/23--stop TF. I think he's capable of sustaining on his own. Ate 100% breakfast today 13. Dry gangrene bilateral feet: Needs to wear shoes for protection. Dry dressing. Maintain adequate nutritional status.  continue to Monitor demarcation/healing. 14. Anxiety/depression: On Zoloft 25 mg/day.  Team support.  ECG with prolonged QTC 512  Continue melatonin  Klonopin  increased to 1 mg 3 times daily and at bedtime-likely lethargic partly d/t this  2/23 Will add low dose seroquel 25mg  for HS confusion/insomnia  2/24: EKG personally reviewed and shows qTC of 504. I will discontinue Seroquel given prolonged qTC.  15. Insomnia: As per father, he sleeps late at night at baseline.   LOS: 5 days A FACE TO FACE EVALUATION WAS PERFORMED  Martha Clan P Sharion Grieves 02/08/2020, 8:12 AM

## 2020-02-08 NOTE — Progress Notes (Signed)
Patient ID: [name] [name], male   DOB: 02-02-1966, 54 y.o.   MRN: [id number]   02/06/20- Pt off floor for dialysis.  02/07/20- SW unable to see pt due to procedure.    02/08/20- SW went by room to meet pt. Pt was getting Xray. 2nd attempt, pt off floor for dialysis.  Loralee Pacas, MSW, [place] Office: [phone] Cell: [phone] Fax: [phone]

## 2020-02-08 NOTE — Progress Notes (Addendum)
Physical Therapy Session Note  Patient Details  Name: [name] [name] MRN: [id number] Date of Birth: 11-Dec-1966  Today's Date: 02/08/2020 PT Individual Time: 2641-5830 PT Individual Time Calculation (min): 40 min   Short Term Goals: Week 1:  PT Short Term Goal 1 (Week 1): Pt will transfer to and from [facility] with min assist. PT Short Term Goal 2 (Week 1): Pt will ambulate 58ft with min assist and RW PT Short Term Goal 3 (Week 1): Pt will attend to task >5 mintues with only min cues from PT PT Short Term Goal 4 (Week 1): Pt will performed bed mobility with CGA  Skilled Therapeutic Interventions/Progress Updates:  Pt received in bed & agreeable to tx. Pants around ankle of L leg, socks of RLE off with pt reporting he got tangled. Supine>sitting EOB with supervision, hospital bed features & extra time. Therapist attempts to assist pt with donning pants & socks but BLE extremely tender to touch so pt threaded pants over RLE with min assist, donned 2 socks on R foot with supervision and significantly extra time (~12 minutes) with cuing to place LE in figure 4 position. Therapist dons B darco shoes with max cuing for pt to elevate BLE more for increased ease of task. Sit>stand with mod assist with pt requiring mod assist for standing balance 2/2 posterior lean while pt attempts to pull pants over hips, ultimately requiring assistance. Pt ambulates ~3 ft to recliner with RW & min assist. Pt declines further ambulation until he receives pain medication. Provided pt with breakfast tray since he had not yet eaten, with cuing to hold beverage cup with one hand for increased ease of opening it with pt requiring extra time to do so. Pt consumed breakfast with min cuing for small bites. Pt performed BLE Long arc quads (1 set x 5 reps each LE) for strengthening. Pt left in recliner with chair alarm donned, call bell & phone in lap, all needs in reach.  Pt on 2L/min supplemental oxygen via nasal cannula throughout  session.  Therapy Documentation Precautions:  Precautions Precautions: Fall Precaution Comments: Peg, Darco shoes Restrictions Weight Bearing Restrictions: No   Pain: C/o unrated pain in toes on B feet - pain meds requested from nurse, nurse & MD made aware of pt's c/o  Therapy/Group: Individual Therapy  [name] 02/08/2020, 9:08 AM

## 2020-02-08 NOTE — Progress Notes (Signed)
Pt states he is having mid sternal chest pain. Pt is unable to give description of pain. Denies any radiating skin is warm and dry. VSS pt's respiratory status is at baseline pt has O2 Lane at 2L  Lungs clear diminished. Pt SOB with exertion denies any pain with inspiration. Pt states he thinks he did work his upper body in therapy. Pt given pain medication.

## 2020-02-08 NOTE — Progress Notes (Signed)
Speech Language Pathology Daily Session Note  Patient Details  Name: [name] [name] MRN: [id number] Date of Birth: 05-15-1966  Today's Date: 02/08/2020 SLP Individual Time: 1015-1030 SLP Individual Time Calculation (min): 15 min and Today's Date: 02/08/2020 SLP Missed Time: 30 Minutes Missed Time Reason: Patient fatigue  Short Term Goals: Week 1: SLP Short Term Goal 1 (Week 1): STGs=LTGs due to ELOS  Skilled Therapeutic Interventions: Skilled treatment session focused on cognitive goals.  Upon arrival, patient was asleep in the recliner and required Max A multimodal cues for arousal. SLP facilitated session by providing Max A verbal cues for sustained attention to oral care via the suction toothbrush in preparation for trials of ice chips. However, patient unable to maintain appropriate amount of arousal needed for safe PO intake. Consistent Max A multimodal cues were needed for arousal, therefore, patient missed 30 minutes of skilled SLP intervention despite X 2 attempts to be rescheduled by SLP. RN aware. Patient left upright in recliner with alarm on and all needs within reach. Continue with current plan of care.      Pain Pain Assessment Pain Scale: 0-10 Pain Score: 0-No pain  Therapy/Group: Individual Therapy  Adrine Hayworth 02/08/2020, 2:22 PM

## 2020-02-08 NOTE — Progress Notes (Signed)
Occupational Therapy Session Note  Patient Details  Name: [name] [name] MRN: [id number] Date of Birth: 03-07-1966  Today's Date: 02/08/2020 OT Individual Time: 1100-1200 OT Individual Time Calculation (min): 60 min    Short Term Goals: Week 1:  OT Short Term Goal 1 (Week 1): STG=LTG  Skilled Therapeutic Interventions/Progress Updates:    1:1.  Pt with significantly decreased arousal but vitals WNL. Pt only able to keep eyes open and reply in 1 word or phrase. Pt preferred music played to improve arousal with minimal impact. Pt completes stand pivot trasnfer with MAX A for power up and min A for pivot steps to w/c. Pt self initates washing face and attempts to brush teeth but falls asleep scanning for toothpaste. Escorted out in hall to gym and sit to stand with MOD A at high low table. Pt completes pipe tree activity in seated with max VC/TC for arousal and pt able to put together simple figure with VC for pacing. Exited session with pt returned to bed and trasnported down to dialysis  Therapy Documentation Precautions:  Precautions Precautions: Fall Precaution Comments: Peg, Darco shoes Restrictions Weight Bearing Restrictions: No General:   Vital Signs: Therapy Vitals Pulse Rate: 91 BP: 105/79 Patient Position (if appropriate): Sitting Oxygen Therapy SpO2: 98 % O2 Device: Nasal Cannula O2 Flow Rate (L/min): 2 L/min Patient Activity (if Appropriate): In chair Pain: Pain Assessment Pain Scale: 0-10 Pain Score: 3  Pain Type: Chronic pain Pain Location: Foot Pain Orientation: Right;Left Pain Descriptors / Indicators: Aching;Discomfort Pain Frequency: Constant Pain Onset: On-going ADL:   Vision   Perception    Praxis   Exercises:   Other Treatments:     Therapy/Group: Individual Therapy  Tonny Branch 02/08/2020, 12:04 PM

## 2020-02-09 ENCOUNTER — Inpatient Hospital Stay (HOSPITAL_COMMUNITY): Payer: Medicare Other | Admitting: Occupational Therapy

## 2020-02-09 ENCOUNTER — Inpatient Hospital Stay (HOSPITAL_COMMUNITY): Payer: Medicare Other | Admitting: Speech Pathology

## 2020-02-09 ENCOUNTER — Inpatient Hospital Stay (HOSPITAL_COMMUNITY): Payer: Medicare Other | Admitting: Physical Therapy

## 2020-02-09 NOTE — Progress Notes (Signed)
Occupational Therapy Session Note  Patient Details  Name: [name] [name] MRN: [id number] Date of Birth: 09/30/1966  Today's Date: 02/09/2020 OT Individual Time: 1300-1330 OT Individual Time Calculation (min): 30 min   Short Term Goals: Week 1:  OT Short Term Goal 1 (Week 1): STG=LTG  Skilled Therapeutic Interventions/Progress Updates:    Pt greeted sitting in recliner with father present. Pt requested that OT change drain sponge, which OT did with some drainage noted from PEG site. Pt then completed sit<>stand with mod A from lower recliner. Pt ambulated to wc in the hallway with RW and CGA-2L of O2 throughout session. Pt brought to therapy gym and completed 5 mins on [employer] arm bike on level 1. Pt returned to room and completed stand-pivot back to recilner with min A and RW. Pt left seated in recliner with alarm belt on, call bell in reach, and needs met.   Therapy Documentation Precautions:  Precautions Precautions: Fall Precaution Comments: Peg, Darco shoes Restrictions Weight Bearing Restrictions: No Pain:   denies pain right now  Therapy/Group: Individual Therapy  Valma Cava 02/09/2020, 1:22 PM

## 2020-02-09 NOTE — Progress Notes (Signed)
[facility] Progress Note   Subjective:  Seen in room - resting quietly. Denies CP or dyspnea. Dialyzed yesterday - 2L UF.  Objective Vitals:   02/08/20 1810 02/08/20 2032 02/09/20 0500 02/09/20 0523  BP: 107/85 103/77  93/67  Pulse: 96 91  92  Resp:  18    Temp: 98.6 F (37 C) 97.9 F (36.6 C)  (!) 97.5 F (36.4 C)  TempSrc: Oral     SpO2: 98% 100%  100%  Weight:   62.8 kg   Height:       Physical Exam General:Frail man,slightly lethargic Heart:RRR; no murmur Lungs:Rales in L base only - clear upper lobes Abdomen:soft, non-tender. PEG in place Extremities:No LE edema Dialysis Access:LUE AVF + thrill  Additional Objective Labs: Basic Metabolic Panel: Recent Labs  Lab 02/03/20 0653 02/04/20 1231 02/05/20 1748 02/06/20 1322 02/08/20 1240  NA 135   < > 136 133* 135  K 4.0   < > 4.7 5.5* 4.8  CL 93*   < > 96* 94* 89*  CO2 24   < > 22 21* 26  GLUCOSE 106*   < > 124* 146* 108*  BUN 78*   < > 101* 126* 100*  CREATININE 5.75*   < > 6.01* 6.92* 6.25*  CALCIUM 8.9   < > 9.1 9.1 9.4  PHOS 2.5  --   --  4.0 3.6   < > = values in this interval not displayed.   Liver Function Tests: Recent Labs  Lab 02/04/20 1231 02/04/20 1231 02/05/20 1748 02/06/20 1322 02/08/20 1240  AST 23  --  15  --   --   ALT 26  --  19  --   --   ALKPHOS 119  --  120  --   --   BILITOT 0.8  --  0.7  --   --   PROT 7.2  --  7.3  --   --   ALBUMIN 3.9   < > 3.7 3.8 3.7   < > = values in this interval not displayed.   CBC: Recent Labs  Lab 02/03/20 0653 02/03/20 0653 02/05/20 1748 02/06/20 1322 02/08/20 1254  WBC 8.2   < > 12.1* 10.6* 10.2  HGB 12.3*   < > 13.2 13.1 12.3*  HCT 40.3   < > 42.7 43.1 40.4  MCV 89.2  --  89.3 89.0 89.4  PLT 114*   < > 120* 151 143*   < > = values in this interval not displayed.   Studies/Results: DG CHEST PORT 1 VIEW  Result Date: 02/08/2020 CLINICAL DATA:  54 year old male with history of shortness of breath. EXAM: PORTABLE  CHEST 1 VIEW COMPARISON:  Chest x-ray 02/05/2020. FINDINGS: Peripheral line in the right hemithorax favored to represent skin fold artifact. Elevation of the right hemidiaphragm. Bibasilar opacities (right greater than left), which may reflect areas of atelectasis and/or consolidation. Cephalization of the pulmonary vasculature with ill-defined opacities and areas of interstitial prominence throughout the lungs bilaterally. Moderate cardiomegaly. Upper mediastinal contours are within normal limits. Aortic atherosclerosis. IMPRESSION: 1. Appearance the chest suggests congestive heart failure, as above. 2. Bibasilar areas of atelectasis and/or consolidation. 3. Probable skin fold artifact projecting over the right hemithorax laterally. Follow-up standing PA and lateral chest radiograph is recommended to ensure the resolution of this finding and exclude pneumothorax. Electronically Signed   By: Vinnie Langton M.D.   On: 02/08/2020 12:29   Medications:  . amiodarone  200 mg Per Tube  Daily  . apixaban  2.5 mg Per Tube BID  . ascorbic acid  500 mg Per Tube BID  . benztropine  1 mg Per Tube BID  . clonazePAM  1 mg Per Tube TID WC & HS  . colchicine  0.6 mg Per Tube Daily  . feeding supplement (NEPRO CARB STEADY)  237 mL Oral BID BM  . feeding supplement (PRO-STAT SUGAR FREE 64)  30 mL Per Tube BID  . folic acid  1 mg Per Tube Daily  . free water  100 mL Per Tube BID  . gabapentin  100 mg Per Tube Q8H  . levothyroxine  25 mcg Per Tube Q0600  . mouth rinse  15 mL Mouth Rinse BID  . Melatonin  3 mg Per Tube QHS  . metoprolol tartrate  25 mg Per Tube TID WC  . midodrine  10 mg Per Tube TID WC  . multivitamin  1 tablet Per Tube QHS  . predniSONE  10 mg Per Tube Q breakfast  . QUEtiapine  25 mg Oral QHS  . thiamine  100 mg Per Tube Daily    Dialysis Orders: Prev orders - will need to change significantly on d/c MWF at [facility], 4:15hr, EDW 74.5kg, 2K/2.5Ca, AVF, heparin 5K - Calcitriol 0.87mcg PO q HD.  No ESA or venofer.  Assessment/Plan: 1.Deconditioning: S/p prolonged [facility] admit (12/2-12/23/19) s/p cardiac arrest, then [facility] until 2/21. Now CIR. 2. ESRD:Continue HD per MWF schedule - next tomorrow. 3. HTN/volume:BP ok, currently on metoprolol + midodrine. CXR 2/24 with CHF changes - UF prn. 4. Anemia:Hgb 12.3 - no ESA needed. 5. Secondary hyperparathyroidism:Ca/Phos ok. No binders/VDRA. 6. Nutrition/dysphagia: TFs + nectars 7. Hx cardiac arrest  8. Hx PE: On Eliquis 9. A-fib: On Eliquis + amiodarone 10. Hx pericardial effusion -> colchicine + prednisone 10mg  QD. 11. Anxiety + lethargy: Meds per primary. 12. HFrEF (<20%) 13. Hx resp failure with vent/trach: Trach decannulated/healed.  Veneta Penton, PA-C 02/09/2020, 9:59 AM  [employer]

## 2020-02-09 NOTE — Progress Notes (Signed)
Speech Language Pathology Daily Session Note  Patient Details  Name: [name] [name] MRN: [id number] Date of Birth: 1966-07-01  Today's Date: 02/09/2020 SLP Individual Time: 1015-1110 SLP Individual Time Calculation (min): 55 min  Short Term Goals: Week 1: SLP Short Term Goal 1 (Week 1): STGs=LTGs due to ELOS  Skilled Therapeutic Interventions: Skilled treatment session focused on dysphagia and speech goals. Patient performed oral care at the sink with overall supervision. Patient consumed trials of ice chips without overt s/s of aspiration. Patient demonstrated one overt cough after sips of thin liquid via cup. Due to patient's consistent overt s/s of aspiration and current respiratory function, recommend repeat MBS tomorrow to assess swallow function. Patient verbalized understanding and agreement. SLP also facilitated session by providing Mod A verbal cues for use of an increased vocal intensity to improve intelligibility to ~75% on the phone with a familiar communication partner. Patient left upright in recliner with alarm on and all needs within reach. Continue with current plan of care.      Pain No/Denies Pain   Therapy/Group: Individual Therapy  Manroop Jakubowicz 02/09/2020, 3:10 PM

## 2020-02-09 NOTE — Progress Notes (Signed)
Patient states "my feet are getting worse and they hurt more than ever before". Wound care performed. Both big toes are black and bleeding. Surrounding toes are inflamed and tissue is sloughing off. Thoroughly cleaned toes and put gauze in between toes. Pain medication given. Will continue plan of care. [place]

## 2020-02-09 NOTE — Progress Notes (Signed)
Social Work Assessment and Plan   Patient Details  Name: [name] [name] MRN: [id number] Date of Birth: 1966/06/01  Today's Date: 02/09/2020  Problem List:  Patient Active Problem List   Diagnosis Date Noted  . Lethargy   . Sleep disturbance   . Generalized anxiety disorder   . Dysphagia   . Somnolence   . Encephalopathy   . Debility   . ESRD on dialysis ([place])   . Dry gangrene ([place])   . Hypotension   . S/P percutaneous endoscopic gastrostomy (PEG) tube placement ([place])   . Pericardial effusion   . Atrial fibrillation ([place])   . Acute on chronic respiratory failure with hypoxia ([place])   . End stage renal disease on dialysis ([place])   . Acute on chronic systolic and diastolic heart failure, NYHA class 4 ([place])   . Chronic atrial fibrillation ([place])   . Acute pulmonary embolism without acute cor pulmonale (HCC)   . Tracheostomy status ([place])   . Adult failure to thrive   . Ventilator dependent ([place])   . Chronic respiratory failure with hypoxia ([place])   . Anoxic encephalopathy ([place])   . Tracheostomy in place [facility])   . Endotracheally intubated   . Palliative care by specialist   . Pressure injury of skin 11/19/2019  . Acute respiratory failure ([place])   . ESRD on hemodialysis ([place])   . Cardiac arrest ([place]) 11/16/2019  . Paroxysmal atrial fibrillation (HCC)   . Superficial venous thrombosis of right arm 11/05/2018  . Nonischemic cardiomyopathy ([place]) 11/03/2018  . Essential hypertension 11/03/2018  . ESRD needing dialysis ([place]) 10/26/2018  . Prolonged Q-T interval on ECG 05/18/2017  . Hernia, inguinal, right 08/05/2016  . Acute on chronic systolic CHF (congestive heart failure) ([place])   . Solitary pulmonary nodule 10/10/2015  . Chronic combined systolic and diastolic heart failure ([place]) 04/18/2014  . Anemia due to blood loss, acute 07/15/2012  . Hypocalcemia 07/15/2012  . Cigarette smoker 11/26/2010   Past Medical History:  Past Medical History:  Diagnosis Date  . Abdominal  distension 08/10/2018  . Acute on chronic respiratory failure with hypoxia ([place])   . Acute on chronic systolic and diastolic heart failure, NYHA class 4 ([place])   . Acute pulmonary embolism without acute cor pulmonale (HCC)   . Alcohol abuse 11/26/2010   Qualifier: Diagnosis of  By: [name] MD, Pershing Proud  . Anemia due to blood loss, acute 07/15/2012  . CHF (congestive heart failure) ([place])   . Cholecystitis 04/18/2014  . Chronic atrial fibrillation ([place])   . Cigarette smoker    has currently quit  . Cocaine abuse ([place]) 11/26/2010   Qualifier: Diagnosis of  By: [name] MD, Benjamine Mola  has been over a year  . Combined congestive systolic and diastolic heart failure ([place]) 04/18/2014   A. Echo 8/13: Severe LVH, EF 40-45%, inferoposterior akinesis, grade 2 diastolic dysfunction, moderate LAE, mild RVE, mildly reduced RVSF, mild RAE; cannot rule out R atrial mass-suggest TEE or cardiac MRI  //  B. Echo 3/17: Mild LVH, EF 25-30%, diffuse HK, grade 2 diastolic dysfunction, mild MR, severe LAE,  moderately reduced RVSF, severe RAE, mild TR, moderate PI, PASP 55 mmHg    . Dyspnea   . End stage renal disease on dialysis ([place])   . ESRD (end stage renal disease) on dialysis [facility])    MWF- [place] (05/18/2017)  . Hemodialysis patient ([place])    M,W,F  . Hernia, inguinal, right 08/05/2016  . Hypertension   . 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 Hypocalcemia 07/15/2012  . Medical non-compliance   . Non-ischemic cardiomyopathy ([place])    A. R/L HC 3/17: Normal coronary arteries, moderate pulmonary hypertension (PASP 65 mmHg), elevated LV filling pressures (LVEDP 45 mmHg)   . NSVT (nonsustained ventricular tachycardia) ([place]) 02/22/2016  . NSVT (nonsustained ventricular tachycardia) ([place]) 02/22/2016  . Pneumonia 11/12/2017  . Polysubstance abuse ([place])   . Prolonged Q-T interval on  ECG 05/18/2017  . Prolonged QT interval 05/18/2017  . Restless leg syndrome   . Solitary pulmonary nodule 10/10/2015   See cxr 10/09/2015 - CT rec 10/10/2015 >>>   . Upper airway cough syndrome 10/09/2015   Off ACEi around 1st Oct 2016  - Sinus CT 10/10/2015 >>>     Past Surgical History:  Past Surgical History:  Procedure Laterality Date  . [place] TRANSPOSITION  01/19/2013   Procedure: BASCILIC VEIN TRANSPOSITION;  Surgeon: Conrad Stapleton, MD;  Location: [place];  Service: Vascular;  Laterality: Left;  left 2nd stage basilic vein transposition  . CARDIAC CATHETERIZATION N/A 02/25/2016   Procedure: Right/Left Heart Cath and Coronary Angiography;  Surgeon: Burnell Blanks, MD;  Location: [place] CV LAB;  Service: Cardiovascular;  Laterality: N/A;  . INGUINAL HERNIA REPAIR Right 08/05/2016   Procedure: RIGHT INGUINAL HERNIA REPAIR WITH MESH;  Surgeon: Donnie Mesa, MD;  Location: [place];  Service: General;  Laterality: Right;  . INSERTION OF DIALYSIS CATHETER  07/17/2012   Procedure: INSERTION OF DIALYSIS CATHETER;  Surgeon: Conrad Sherwood, MD;  Location: [place];  Service: Vascular;  Laterality: Right;  right internal jugular  . INSERTION OF MESH Right 08/05/2016   Procedure: INSERTION OF MESH;  Surgeon: Donnie Mesa, MD;  Location: [place];  Service: General;  Laterality: Right;  . IR GASTROSTOMY TUBE MOD SED  01/09/2020  . IR THORACENTESIS ASP PLEURAL SPACE W/IMG GUIDE  01/12/2020  . REVISION OF ARTERIOVENOUS GORETEX GRAFT Left 03/23/7352   Procedure: PLICATION OF ARTERIOVENOUS FISTULA LEFT ARM;  Surgeon: Elam Dutch, MD;  Location: [place];  Service: Vascular;  Laterality: Left;  . REVISON OF ARTERIOVENOUS FISTULA Left 01/06/2017   Procedure: REVISON OF ARTERIOVENOUS FISTULA;  Surgeon: Conrad Lake Ripley, MD;  Location: [place];  Service: Vascular;  Laterality: Left;  . REVISON OF ARTERIOVENOUS FISTULA Left 10/27/2018   Procedure: REVISION PLICATION OF ARTERIOVENOUS FISTULA  LEFT UPPER ARM;   Surgeon: Waynetta Sandy, MD;  Location: [place];  Service: Vascular;  Laterality: Left;  . TRACHEOSTOMY TUBE PLACEMENT N/A 11/24/2019   Procedure: TRACHEOSTOMY;  Surgeon: Izora Gala, MD;  Location: [place];  Service: ENT;  Laterality: N/A;  . UMBILICAL HERNIA REPAIR N/A 08/05/2016   Procedure: [place];  Surgeon: Donnie Mesa, MD;  Location: [place];  Service: General;  Laterality: N/A;  . VENOGRAM N/A 08/09/2012   Procedure: VENOGRAM;  Surgeon: Conrad Willis, MD;  Location: [facility] CATH LAB;  Service: Cardiovascular;  Laterality: N/A;   Social History:  reports that he quit smoking about 15 months ago. His smoking use included cigarettes. He quit after 30.00 years of use. He has never used smokeless tobacco. He reports current alcohol use of about 4.0 standard drinks of alcohol per week. He reports that he does not use drugs.  Family / Support Systems Marital Status: Single Patient Roles: Parent Spouse/Significant Other: Single Children: 2 adult  children-no current relationship Other Supports: Pt mother and father Anticipated Caregiver: Pt father to assist with transportation to/from medical appointments Ability/Limitations of Caregiver: Pt mother is able to provide supervision only due to own health problems (also on HD) Caregiver Availability: 24/7 Family Dynamics: Pt lives with his parents. Pt father transports pt and his wife to/from dialysis and manages many of the house needs.  Social History Preferred language: English Religion: Non-Denominational Cultural Background: Pt worked in Biomedical scientist. Unsure on length of time Education: high school Read: Yes Write: Yes Employment Status: Disabled Public relations account executive Issues: Denies Guardian/Conservator: N/A   Abuse/Neglect Abuse/Neglect Assessment Can Be Completed: Yes Physical Abuse: Denies Verbal Abuse: Denies Sexual Abuse: Denies Exploitation of patient/patient's resources: Denies Self-Neglect:  Denies  Emotional Status Pt's affect, behavior and adjustment status: Pt was lethargic during assessment, and requried extra time to communicate with SW. Pt appears to be adjusting to his condition. Recent Psychosocial Issues: None Psychiatric History: Denies Substance Abuse History: Denies. Pt reports that he quit drinking months ago.Unsure on amount of use; pt denies recreational drug use.  Patient / Family Perceptions, Expectations & Goals Pt/Family understanding of illness & functional limitations: Pt and family have a general understanding of pt condition. Premorbid pt/family roles/activities: Pt father was transportating to/from medical appointments. Anticipated changes in roles/activities/participation: No changes Pt/family expectations/goals: "get strong."  [employer]: None Premorbid Home Care/DME Agencies: None Transportation available at discharge: Father to Scientist, clinical (histocompatibility and immunogenetics) referrals recommended: Neuropsychology  Discharge Planning Living Arrangements: Alone Support Systems: Parent Type of Residence: Private residence Insurance Resources: [employer], [employer] (specify county) Museum/gallery curator Resources: SSD Financial Screen Referred: No Living Expenses: Own Money Management: Patient Does the patient have any problems obtaining your medications?: No Sw Barriers to Discharge: Lack of/limited family support, Medical stability, Decreased caregiver support Sw Barriers to Discharge Comments: [name] transports him to/from dialysis, as well as his mother. Limited caregiver support. Social Work Anticipated Follow Up Needs: [place] Additional Notes/Comments: Plans to d/c to home.  Clinical Impression SW met with pt in room to introduce self, explain role, and discuss discharge process. Pt was lethargic during assessment in which he required more time, and often fell asleep or did not answer SW questions. Pt gave SW permission to discuss  discharge plan with his mother.   SW called pt mother [name] [name] [phone]) to discuss discharge plan. She expressed concerns related to pt father being exhausted about constant transportation for her and pt to dialysis. She stated while he is not complaining, she wanted toknow if there was anyway to reduce the burden by pt transferring to her dialysis center- [facility] 1st shift. SW stated can discuss with renal navigator, however, pt will have to be in agreement. SW discussed other natural supports. Mother considering speaking with pt son [name] [name] since pt states he may be able to help. Mother states relationship is strained and she is unsure. SW encouraged her reaching out to grandson [name] [name]. She also states pt has used [facility] in the past for transportation services. SW provided d/c date, and will continue to communicate with updates.   SW spoke with [employer] [phone]) to discuss above. Reports she will call [place] to see if this is an option. Requests follow up on pt d/c date.  *SW received message from [place] reporting that [facility] does not admit patient's who are relatives in the event one were to have a cardiac event (I.e. code). Doctor, general practice will  Not return until  02/20/20 in which the concerns related to transportation can be discussed further. See note on 02/09/20.   SW will provide updates to pt and mother on above.   Loralee Pacas, MSW, [place] Office: [phone] Cell: [phone] Fax: [phone] 02/09/2020, 4:32 PM

## 2020-02-09 NOTE — Progress Notes (Signed)
Renal Navigator received call from CIR CSW/A. Barbra Sarks stating patient's mother's request to have patient's OP HD clinic and scheduled changed to her's, in order to make transportation easier for the family. Renal Navigator contacted Clinic Manager at patient's mother's OP HD clinic/[facility] to see if this request is a possibility. She states that his acceptance will be up to Dr. Deterding/Medical Director, who is out of the office until February 20, 2020, but that family members are not generally put on the same shift in the event that one codes, the other one does not witness it.  Per CIR CSW, patient's projected CIR discharge date is February 21, 2020. Therefore, if family is requesting to change his clinic to [facility], they will need to make this request after discharge. Renal Navigator spoke briefly with patient in room, who is in agreement of changing clinics. Navigator informed him that [facility] would make this decision and he is out of the office. Navigator told patient that this request can be made once he is discharge as patients transfer from clinic to clinic and transfer do not have to be made from the hospital. Renal Navigator left message for patient's mother to return call to discuss.   Alphonzo Cruise, [place] Renal Navigator [phone]

## 2020-02-09 NOTE — Progress Notes (Signed)
Physical Therapy Session Note  Patient Details  Name: [name] [name] MRN: [id number] Date of Birth: 1966-01-21  Today's Date: 02/09/2020 PT Individual Time: 0901-1000 PT Individual Time Calculation (min): 59 min   Short Term Goals: Week 1:  PT Short Term Goal 1 (Week 1): Pt will transfer to and from [facility] with min assist. PT Short Term Goal 2 (Week 1): Pt will ambulate 34ft with min assist and RW PT Short Term Goal 3 (Week 1): Pt will attend to task >5 mintues with only min cues from PT PT Short Term Goal 4 (Week 1): Pt will performed bed mobility with CGA  Skilled Therapeutic Interventions/Progress Updates: Pt presents supine in bed, very drowsy, but does eventually agree to therapy.  Pt transfers sup to sit w/ supervision and use of side rail.  Pt scoots to EOB stating pain bilateral feet 8/10.  Nursing called and gives pain meds, as well as cleaning feeding tube while sitting.  Pt assisted w/ doffing and then donning clean pants.  Pt donned slipper socks in crossed leg position w/ increased time after initiated by PT.  Pt changed T-shirt w/ set-up.  Pt required mod A multiple trials of sit to stand  Transfers to pull up pants and manage clothing.  PT placed bilateral Darco shoes.  Pt transferred to recliner chair w/ min A and RW approx. 4'.  Pt performed LE therex to increase strength.  Pt performed 3 x 10 AP, hip flexion and LAQ w/ manual stretch of HS to increase knee ext.  Pt required monitoring of O2 sats on 2 LPM w/ O2 sats remaining > 90% through all trials, increasing to > 95% within 1'.  Pt amb up to 35' w/ RWand min A, including turns to return to recliner chair.  Pt states pain decreasing throughout therapy session, but unable to quantify.  Pt left in recliner chair w/ chair alarm on and all needs in reach.     Therapy Documentation Precautions:  Precautions Precautions: Fall Precaution Comments: Peg, Darco shoes Restrictions Weight Bearing Restrictions: No General:   Vital  Signs:  Pain: pt states pain to bilateral feet of 8/10 but decreased after meds and activity. Pain Assessment Pain Scale: 0-10 Pain Score: 8  Pain Type: Chronic pain Pain Location: Foot Pain Orientation: Right;Left Pain Descriptors / Indicators: Aching;Discomfort Pain Frequency: Constant Pain Onset: On-going Patients Stated Pain Goal: 2 Pain Intervention(s): Medication (See eMAR) Mobility:      Therapy/Group: Individual Therapy  [name] 02/09/2020, 10:24 AM

## 2020-02-09 NOTE — Progress Notes (Addendum)
[place] PHYSICAL MEDICINE & REHABILITATION PROGRESS NOTE  Subjective/Complaints: Pt denies any new issues. Says he slept. Denies pain, problems breathing currently.   ROS: Limited due to cognitive/behavioral    Objective: Vital Signs: Blood pressure 93/67, pulse 92, temperature (!) 97.5 F (36.4 C), resp. rate 18, height 5' 10.5" (1.791 m), weight 62.8 kg, SpO2 100 %. DG CHEST PORT 1 VIEW  Result Date: 02/08/2020 CLINICAL DATA:  54 year old male with history of shortness of breath. EXAM: PORTABLE CHEST 1 VIEW COMPARISON:  Chest x-ray 02/05/2020. FINDINGS: Peripheral line in the right hemithorax favored to represent skin fold artifact. Elevation of the right hemidiaphragm. Bibasilar opacities (right greater than left), which may reflect areas of atelectasis and/or consolidation. Cephalization of the pulmonary vasculature with ill-defined opacities and areas of interstitial prominence throughout the lungs bilaterally. Moderate cardiomegaly. Upper mediastinal contours are within normal limits. Aortic atherosclerosis. IMPRESSION: 1. Appearance the chest suggests congestive heart failure, as above. 2. Bibasilar areas of atelectasis and/or consolidation. 3. Probable skin fold artifact projecting over the right hemithorax laterally. Follow-up standing PA and lateral chest radiograph is recommended to ensure the resolution of this finding and exclude pneumothorax. Electronically Signed   By: Vinnie Langton M.D.   On: 02/08/2020 12:29   Recent Labs    02/06/20 1322 02/08/20 1254  WBC 10.6* 10.2  HGB 13.1 12.3*  HCT 43.1 40.4  PLT 151 143*   Recent Labs    02/06/20 1322 02/08/20 1240  NA 133* 135  K 5.5* 4.8  CL 94* 89*  CO2 21* 26  GLUCOSE 146* 108*  BUN 126* 100*  CREATININE 6.92* 6.25*  CALCIUM 9.1 9.4    Physical Exam: BP 93/67 (BP Location: Right Arm)   Pulse 92   Temp (!) 97.5 F (36.4 C)   Resp 18   Ht 5' 10.5" (1.791 m)   Wt 62.8 kg   SpO2 100%   BMI 19.58 kg/m   Constitutional: No distress . Vital signs reviewed. HEENT: EOMI, oral membranes moist Neck: supple Cardiovascular: RRR without murmur. No JVD    Respiratory/Chest: decreased effort. Crackles at bases    GI/Abdomen: BS +, non-tender, non-distended, PEG with mild dc Ext: no edema Skin: See above. Bilateral toes gangrenous Psych:flat, disengaged Musc: No edema in extremities.  No tenderness in extremities. Neurological: alert, dysarthric speech. Slow to process Motor: 3+-4 -/5 moves all 4's.    Assessment/Plan: 1. Functional deficits secondary to encephalopathy which require 3+ hours per day of interdisciplinary therapy in a comprehensive inpatient rehab setting.  Physiatrist is providing close team supervision and 24 hour management of active medical problems listed below.  Physiatrist and rehab team continue to assess barriers to discharge/monitor patient progress toward functional and medical goals  Care Tool:  Bathing  Bathing activity did not occur: Refused Body parts bathed by patient: Right arm, Left arm, Chest, Abdomen, Front perineal area, Right lower leg, Left lower leg, Buttocks, Right upper leg, Left upper leg   Body parts bathed by helper: (everything but face)     Bathing assist Assist Level: Total Assistance - Patient < 25%     Upper Body Dressing/Undressing Upper body dressing   What is the patient wearing?: Pull over shirt    Upper body assist Assist Level: Minimal Assistance - Patient > 75%    Lower Body Dressing/Undressing Lower body dressing      What is the patient wearing?: Underwear/pull up, Pants     Lower body assist Assist for lower body dressing: Total  Assistance - Patient < 25%     Toileting Toileting    Toileting assist Assist for toileting: Moderate Assistance - Patient 50 - 74%     Transfers Chair/bed transfer  Transfers assist     Chair/bed transfer assist level: Minimal Assistance - Patient > 75%      Locomotion Ambulation   Ambulation assist      Assist level: Minimal Assistance - Patient > 75% Assistive device: Walker-rolling Max distance: 60 ft   Walk 10 feet activity   Assist     Assist level: Minimal Assistance - Patient > 75% Assistive device: Walker-rolling   Walk 50 feet activity   Assist    Assist level: Minimal Assistance - Patient > 75% Assistive device: Walker-rolling    Walk 150 feet activity   Assist Walk 150 feet activity did not occur: Safety/medical concerns         Walk 10 feet on uneven surface  activity   Assist Walk 10 feet on uneven surfaces activity did not occur: Safety/medical concerns         Wheelchair     Assist Will patient use wheelchair at discharge?: Yes Type of Wheelchair: Manual    Wheelchair assist level: Minimal Assistance - Patient > 75% Max wheelchair distance: 75    Wheelchair 50 feet with 2 turns activity    Assist        Assist Level: Minimal Assistance - Patient > 75%   Wheelchair 150 feet activity     Assist     Assist Level: Moderate Assistance - Patient 50 - 74%      Medical Problem List and Plan: 1.  Deficits with mobility, endurance, self-care secondary to encephalopathy with debility.  -Continue CIR therapies including PT, OT, and SLP   -pt with baseline cognitive deficits 2.  Antithrombotics: -PE/anticoagulation:  Pharmaceutical: Other (comment)--on low dose Eliquis             -antiplatelet therapy:  N/A 3. Pain Management: Oxycodone or tramadol prn.   2/24: Therapist notes that Mr. Eckstein has been having hypersensitivity of his gangrenous toes. Will add low dose Gabapentin 100mg  TID to help with pain-- low dose due to ESRD.  2/25 continue gabapentin---watch for neurosedation  4. Mood: LCSW to follow for evaluation and support.    See #14             -antipsychotic agents: N/A   See #14 5. Neuropsych: This patient is not fully capable of making decisions on his  own behalf. 6. Skin/Wound Care: Dry dressing to bilateral feet.              stoma closing. PEG site intact 7. Fluids/Electrolytes/Nutrition:   2/23 stopped TF  -monitor intake.   8. A fib/NSVT: On amiodarone and metoprolol for rate control.              Rate in 90's on 2/25   -isolated bradycardia yesterday   Monitor with increased activity.  -intermittent SOB, CXR demonstrates CHF. Nephrology working on volume mgt with HD 9.  ESRD: HD MWF at the end of the day to help with activity tolerance.   -volume/weight mgt per nephrology, given cxr findings Filed Weights   02/08/20 1204 02/08/20 1632 02/09/20 0500  Weight: 63.8 kg 62.9 kg 62.8 kg  10. Pericardial effusion: Has been on colchicine since 1/19 and has been weaned to prednisone 10 mg daily. Last echo 1/29 shows decrease to small effusion. 11. Hypotension: Continue midodrine 10 mg tid.  BP's still soft 2/25 12. Dysphagia:   Tolerating Nephro    S/p PEG             Advanced to regular thins, downgraded again to D3 nectars after evaluation on 2/21.   2/22--dc'ed reglan, following for TF tolerance  2/23--stopped TF.    2/25 seems to be eating 50-100% of meals, dc h2o flushes 13. Dry gangrene bilateral feet: Needs to wear shoes for protection. Dry dressing. Maintain adequate nutritional status.  following for demarcation/healing. 14. Anxiety/depression: On Zoloft 25 mg/day.  Team support.  ECG with prolonged QTC 512  Continue melatonin  Klonopin increased to 1 mg 3 times daily and at bedtime-likely lethargic partly d/t this  2/23 Will add low dose seroquel 25mg  for HS confusion/insomnia  2/24: seroquel stopped d/t ongoing QTC. It was actually lower than last EKG. Think this can be used at low dose as long as we're monitoring  15. Insomnia: Per father, he sleeps late at night at baseline.   LOS: 6 days A FACE TO FACE EVALUATION WAS PERFORMED  Meredith Staggers 02/09/2020, 8:46 AM

## 2020-02-10 ENCOUNTER — Encounter (HOSPITAL_COMMUNITY): Payer: Medicare Other | Admitting: Speech Pathology

## 2020-02-10 ENCOUNTER — Inpatient Hospital Stay (HOSPITAL_COMMUNITY): Payer: Medicare Other | Admitting: Occupational Therapy

## 2020-02-10 ENCOUNTER — Inpatient Hospital Stay (HOSPITAL_COMMUNITY): Payer: Medicare Other

## 2020-02-10 ENCOUNTER — Inpatient Hospital Stay (HOSPITAL_COMMUNITY): Payer: Medicare Other | Admitting: Physical Therapy

## 2020-02-10 DIAGNOSIS — N186 End stage renal disease: Secondary | ICD-10-CM

## 2020-02-10 DIAGNOSIS — Z992 Dependence on renal dialysis: Secondary | ICD-10-CM

## 2020-02-10 DIAGNOSIS — I96 Gangrene, not elsewhere classified: Secondary | ICD-10-CM

## 2020-02-10 LAB — RENAL FUNCTION PANEL
Albumin: 3.5 g/dL (ref 3.5–5.0)
Albumin: 3.9 g/dL (ref 3.5–5.0)
Anion gap: 17 — ABNORMAL HIGH (ref 5–15)
Anion gap: 18 — ABNORMAL HIGH (ref 5–15)
BUN: 80 mg/dL — ABNORMAL HIGH (ref 6–20)
BUN: 26 mg/dL — ABNORMAL HIGH (ref 6–20)
CO2: 24 mmol/L (ref 22–32)
CO2: 26 mmol/L (ref 22–32)
Calcium: 8.9 mg/dL (ref 8.9–10.3)
Calcium: 8.8 mg/dL — ABNORMAL LOW (ref 8.9–10.3)
Chloride: 95 mmol/L — ABNORMAL LOW (ref 98–111)
Chloride: 94 mmol/L — ABNORMAL LOW (ref 98–111)
Creatinine, Ser: 5.59 mg/dL — ABNORMAL HIGH (ref 0.61–1.24)
Creatinine, Ser: 3.09 mg/dL — ABNORMAL HIGH (ref 0.61–1.24)
GFR calc Af Amer: 13 mL/min — ABNORMAL LOW
GFR calc Af Amer: 26 mL/min — ABNORMAL LOW
GFR calc non Af Amer: 11 mL/min — ABNORMAL LOW
GFR calc non Af Amer: 22 mL/min — ABNORMAL LOW
Glucose, Bld: 116 mg/dL — ABNORMAL HIGH (ref 70–99)
Glucose, Bld: 130 mg/dL — ABNORMAL HIGH (ref 70–99)
Phosphorus: 3 mg/dL (ref 2.5–4.6)
Phosphorus: 2.1 mg/dL — ABNORMAL LOW (ref 2.5–4.6)
Potassium: 5.5 mmol/L — ABNORMAL HIGH (ref 3.5–5.1)
Potassium: 4.3 mmol/L (ref 3.5–5.1)
Sodium: 136 mmol/L (ref 135–145)
Sodium: 138 mmol/L (ref 135–145)

## 2020-02-10 LAB — CBC
HCT: 41.1 % (ref 39.0–52.0)
HCT: 43.8 % (ref 39.0–52.0)
Hemoglobin: 12.3 g/dL — ABNORMAL LOW (ref 13.0–17.0)
Hemoglobin: 13.1 g/dL (ref 13.0–17.0)
MCH: 27.3 pg (ref 26.0–34.0)
MCH: 27.5 pg (ref 26.0–34.0)
MCHC: 29.9 g/dL — ABNORMAL LOW (ref 30.0–36.0)
MCHC: 29.9 g/dL — ABNORMAL LOW (ref 30.0–36.0)
MCV: 91.3 fL (ref 80.0–100.0)
MCV: 92 fL (ref 80.0–100.0)
Platelets: 167 10*3/uL (ref 150–400)
Platelets: 154 K/uL (ref 150–400)
RBC: 4.5 MIL/uL (ref 4.22–5.81)
RBC: 4.76 MIL/uL (ref 4.22–5.81)
RDW: 21.2 % — ABNORMAL HIGH (ref 11.5–15.5)
RDW: 21.4 % — ABNORMAL HIGH (ref 11.5–15.5)
WBC: 6.9 10*3/uL (ref 4.0–10.5)
WBC: 5.4 K/uL (ref 4.0–10.5)
nRBC: 0 % (ref 0.0–0.2)
nRBC: 0 % (ref 0.0–0.2)

## 2020-02-10 NOTE — Progress Notes (Signed)
Speech Language Pathology Weekly Progress and Session Note  Patient Details  Name: Bj Morlock MRN: [id number] Date of Birth: Dec 04, 1966  Beginning of progress report period: February 03, 2020 End of progress report period: February 10, 2020  New Short Term Goals: Week 2: SLP Short Term Goal 1 (Week 2): Patient will consume current diet with minimal overt s/s of aspiration with Mod I for use of swallowing compensatory strategies. SLP Short Term Goal 2 (Week 2): Patient wil utilize an increased vocal intensity at the phrase level to achieve ~75% intelligibility with Min A verbal cues. SLP Short Term Goal 3 (Week 2): Patient will recall new, daily information with Min verbal cues for use of compensatory strategies. SLP Short Term Goal 5 (Week 2): Patient will demonstrate sustained attention to functional tasks for 45 minutes with supevision verbal cues for redirection.  Weekly Progress Updates: Patient has made functional gains this reporting period. Patient had a MBS today which showed only flash penetration with thin liquids, therefore, patient will be upgraded to regular textures with thin liquids. Patient also demonstrates improved arousal with increased attention and problem solving with functional tasks. However, overall Mod A verbal cues are required for recall of new information (baseline memory deficits) and for use of an increased vocal intensity secondary to dysphonia. Patient and family education ongoing. Patient would benefit from continued skilled SLP intervention to maximize his speech, cognitive and swallowing function prior to discharge.      Intensity: Minumum of 1-2 x/day, 30 to 90 minutes Frequency: 3 to 5 out of 7 days Duration/Length of Stay: 02/21/20 Treatment/Interventions: Cognitive remediation/compensation;Dysphagia/aspiration precaution training;Speech/Language facilitation;Internal/external aids;Cueing hierarchy;Environmental controls;Therapeutic  Activities;Patient/family education;Functional tasks     Xaidyn Kepner, [place] 02/10/2020, 6:48 AM

## 2020-02-10 NOTE — Progress Notes (Addendum)
Physical Therapy Weekly Progress Note  Patient Details  Name: [name] [name] MRN: [id number] Date of Birth: 05-02-1966  Beginning of progress report period: February 04, 2020 End of progress report period: February 10, 2020  Today's Date: 02/10/2020  Patient has met 3 of 4 short term goals.  Pt is making fair progress towards LTG's and has required as little as CGA for sit>stand and stand pivot transfers with RW on this date, and completes bed mobility with supervision. Pt is able to ambulate a maximum of 60 ft with RW & min assist. Pt would benefit from continued skilled PT treatment to focus on transfers, gait, stair negotiation as pt has steps without rails to enter house, endurance, strength & balance.   Patient continues to demonstrate the following deficits muscle weakness, decreased cardiorespiratoy endurance and decreased oxygen support, decreased coordination, decreased awareness, decreased problem solving, decreased safety awareness and decreased memory, and decreased standing balance, decreased postural control and decreased balance strategies and therefore will continue to benefit from skilled PT intervention to increase functional independence with mobility.  Patient progressing toward long term goals..  Continue plan of care. Car transfer goal upgraded to [place].  PT Short Term Goals Week 1:  PT Short Term Goal 1 (Week 1): Pt will transfer to and from [facility] with min assist. PT Short Term Goal 1 - Progress (Week 1): Met PT Short Term Goal 2 (Week 1): Pt will ambulate 71f with min assist and RW PT Short Term Goal 2 - Progress (Week 1): Progressing toward goal PT Short Term Goal 3 (Week 1): Pt will attend to task >5 mintues with only min cues from PT PT Short Term Goal 3 - Progress (Week 1): Met PT Short Term Goal 4 (Week 1): Pt will performed bed mobility with CGA PT Short Term Goal 4 - Progress (Week 1): Met Week 2:  PT Short Term Goal 1 (Week 2): Pt will negotiate 4 steps with B  rails with min assist. PT Short Term Goal 2 (Week 2): Pt will complete car transfer with LRAD & min assist. PT Short Term Goal 3 (Week 2): Pt will ambulate 100 ft with LRAD & min assist.   Therapy Documentation Precautions:  Precautions Precautions: Fall Precaution Comments: Peg, Darco shoes Restrictions Weight Bearing Restrictions: No  Therapy/Group: Individual Therapy  VWaunita Schooner2/26/2021, 12:26 PM

## 2020-02-10 NOTE — Progress Notes (Signed)
[facility] Progress Note   Subjective: Seen on HD. Asking to eat while on HD otherwise no C/Os   Objective Vitals:   02/10/20 0316 02/10/20 0500 02/10/20 1145 02/10/20 1151  BP: 107/84  111/86 99/84  Pulse: 93  88 86  Resp: 18  16 16   Temp: 97.9 F (36.6 C)  98.5 F (36.9 C) 98.5 F (36.9 C)  TempSrc:   Oral Oral  SpO2: 100%   98%  Weight:  62.9 kg  64.4 kg  Height:       Physical Exam General: Chronically ill appearing male in NAD Heart: S1,S2, RRR Lungs: Bilateral breath sounds decreased in bases. CTAB Abdomen: S, NT. Peg with drsg CDI Extremities:no LE edema Dialysis Access: L AVF Cannulated at present   Additional Objective Labs: Basic Metabolic Panel: Recent Labs  Lab 02/05/20 1748 02/06/20 1322 02/08/20 1240  NA 136 133* 135  K 4.7 5.5* 4.8  CL 96* 94* 89*  CO2 22 21* 26  GLUCOSE 124* 146* 108*  BUN 101* 126* 100*  CREATININE 6.01* 6.92* 6.25*  CALCIUM 9.1 9.1 9.4  PHOS  --  4.0 3.6   Liver Function Tests: Recent Labs  Lab 02/04/20 1231 02/04/20 1231 02/05/20 1748 02/06/20 1322 02/08/20 1240  AST 23  --  15  --   --   ALT 26  --  19  --   --   ALKPHOS 119  --  120  --   --   BILITOT 0.8  --  0.7  --   --   PROT 7.2  --  7.3  --   --   ALBUMIN 3.9   < > 3.7 3.8 3.7   < > = values in this interval not displayed.   No results for input(s): LIPASE, AMYLASE in the last 168 hours. CBC: Recent Labs  Lab 02/05/20 1748 02/06/20 1322 02/08/20 1254  WBC 12.1* 10.6* 10.2  HGB 13.2 13.1 12.3*  HCT 42.7 43.1 40.4  MCV 89.3 89.0 89.4  PLT 120* 151 143*   Blood Culture    Component Value Date/Time   SDES BLOOD RIGHT HAND 12/17/2019 1520   SPECREQUEST  12/17/2019 1520    BOTTLES DRAWN AEROBIC ONLY Blood Culture results may not be optimal due to an inadequate volume of blood received in culture bottles   CULT  12/17/2019 1520    NO GROWTH 5 DAYS Performed at [facility], [place] [address], [place], [place] [address]    REPTSTATUS 12/22/2019 FINAL 12/17/2019 1520    Cardiac Enzymes: No results for input(s): CKTOTAL, CKMB, CKMBINDEX, TROPONINI in the last 168 hours. CBG: No results for input(s): GLUCAP in the last 168 hours. Iron Studies: No results for input(s): IRON, TIBC, TRANSFERRIN, FERRITIN in the last 72 hours. @lablastinr3 @ Studies/Results: DG Swallowing Func-Speech Pathology  Result Date: 02/10/2020 Objective Swallowing Evaluation: Type of Study: MBS-Modified Barium Swallow Study  Patient Details Name: Perfecto Purdy MRN: [id number] Date of Birth: Jan 08, 1966 Today's Date: 02/10/2020 Time: No data recorded-No data recorded No data recorded Past Medical History: Past Medical History: Diagnosis Date . Abdominal distension 08/10/2018 . Acute on chronic respiratory failure with hypoxia ([place])  . Acute on chronic systolic and diastolic heart failure, NYHA class 4 ([place])  . Acute pulmonary embolism without acute cor pulmonale (HCC)  . Alcohol abuse 11/26/2010  Qualifier: Diagnosis of  By: [name] MD, Pershing Proud . Anemia due to blood loss, acute 07/15/2012 . CHF (congestive heart failure) ([place])  . Cholecystitis 04/18/2014 .  Chronic atrial fibrillation ([place])  . Cigarette smoker   has currently quit . Cocaine abuse ([place]) 11/26/2010  Qualifier: Diagnosis of  By: [name] MD, Benjamine Mola  has been over a year . 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 Echo 8/13: Severe LVH, EF 40-45%, inferoposterior akinesis, grade 2 diastolic dysfunction, moderate LAE, mild RVE, mildly reduced RVSF, mild RAE; cannot rule out R atrial mass-suggest TEE or cardiac MRI  //  B. Echo 3/17: Mild LVH, EF 25-30%, diffuse HK, grade 2 diastolic dysfunction, mild MR, severe LAE,  moderately reduced RVSF, severe RAE, mild TR, moderate PI, PASP 55 mmHg   . Dyspnea  . End stage renal disease on dialysis ([place])  . ESRD (end stage renal disease) on dialysis [facility])   MWF- [place] (05/18/2017) . Hemodialysis patient  ([place])   M,W,F . Hernia, inguinal, right 08/05/2016 . Hypertension  . 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 Hypocalcemia 07/15/2012 . Medical non-compliance  . Non-ischemic cardiomyopathy ([place])   A. R/L HC 3/17: Normal coronary arteries, moderate pulmonary hypertension (PASP 65 mmHg), elevated LV filling pressures (LVEDP 45 mmHg)  . NSVT (nonsustained ventricular tachycardia) ([place]) 02/22/2016 . NSVT (nonsustained ventricular tachycardia) ([place]) 02/22/2016 . Pneumonia 11/12/2017 . Polysubstance abuse ([place])  . Prolonged Q-T interval on ECG 05/18/2017 . Prolonged QT interval 05/18/2017 . Restless leg syndrome  . Solitary pulmonary nodule 10/10/2015  See cxr 10/09/2015 - CT rec 10/10/2015 >>>  . Upper airway cough syndrome 10/09/2015  Off ACEi around 1st Oct 2016  - Sinus CT 10/10/2015 >>>   Past Surgical History: Past Surgical History: Procedure Laterality Date . [place] TRANSPOSITION  01/19/2013  Procedure: BASCILIC VEIN TRANSPOSITION;  Surgeon: Conrad Holmes, MD;  Location: [place];  Service: Vascular;  Laterality: Left;  left 2nd stage basilic vein transposition . CARDIAC CATHETERIZATION N/A 02/25/2016  Procedure: Right/Left Heart Cath and Coronary Angiography;  Surgeon: Burnell Blanks, MD;  Location: [place] CV LAB;  Service: Cardiovascular;  Laterality: N/A; . INGUINAL HERNIA REPAIR Right 08/05/2016  Procedure: RIGHT INGUINAL HERNIA REPAIR WITH MESH;  Surgeon: Donnie Mesa, MD;  Location: [place];  Service: General;  Laterality: Right; . INSERTION OF DIALYSIS CATHETER  07/17/2012  Procedure: INSERTION OF DIALYSIS CATHETER;  Surgeon: Conrad Widener, MD;  Location: [place];  Service: Vascular;  Laterality: Right;  right internal jugular . INSERTION OF MESH Right 08/05/2016  Procedure: INSERTION OF MESH;  Surgeon: Donnie Mesa, MD;  Location: [place];  Service: General;  Laterality: Right; . IR GASTROSTOMY  TUBE MOD SED  01/09/2020 . IR THORACENTESIS ASP PLEURAL SPACE W/IMG GUIDE  01/12/2020 . REVISION OF ARTERIOVENOUS GORETEX GRAFT Left 04/02/3789  Procedure: PLICATION OF ARTERIOVENOUS FISTULA LEFT ARM;  Surgeon: Elam Dutch, MD;  Location: [place];  Service: Vascular;  Laterality: Left; . REVISON OF ARTERIOVENOUS FISTULA Left 01/06/2017  Procedure: REVISON OF ARTERIOVENOUS FISTULA;  Surgeon: Conrad Muleshoe, MD;  Location: [place];  Service: Vascular;  Laterality: Left; . REVISON OF ARTERIOVENOUS FISTULA Left 10/27/2018  Procedure: REVISION PLICATION OF ARTERIOVENOUS FISTULA  LEFT UPPER ARM;  Surgeon: Waynetta Sandy, MD;  Location: [place];  Service: Vascular;  Laterality: Left; . TRACHEOSTOMY TUBE PLACEMENT N/A 11/24/2019  Procedure: TRACHEOSTOMY;  Surgeon: Izora Gala, MD;  Location: [place];  Service: ENT;  Laterality: N/A; . UMBILICAL HERNIA REPAIR N/A 08/05/2016  Procedure: [place]  WITH MESH;  Surgeon: Donnie Mesa, MD;  Location: [place];  Service: General;  Laterality: N/A; . VENOGRAM N/A 08/09/2012  Procedure: VENOGRAM;  Surgeon: Conrad [place], MD;  Location: [facility] CATH LAB;  Service: Cardiovascular;  Laterality: N/A; HPI: See H&P  No data recorded Assessment / Plan / Recommendation CHL IP CLINICAL IMPRESSIONS 02/10/2020 Clinical Impression Patient demonstrates a mild pharyngeal dysphagia characterized by inconsistent timing when triggering his swallow resulting in intermittent flash penetration. No episodes of aspiration noted. Patient's oral phase also appeared [facility]. Therefore, recommend patient upgrade to regular textures with thin liquids. Patient verbalize understanding and agreement. SLP Visit Diagnosis Dysphagia, pharyngeal phase (R13.13) Attention and concentration deficit following -- Frontal lobe and executive function deficit following -- Impact on safety and function Mild aspiration risk   CHL IP TREATMENT RECOMMENDATION 02/10/2020 Treatment Recommendations Therapy as outlined in  treatment plan below   No flowsheet data found. CHL IP DIET RECOMMENDATION 02/10/2020 SLP Diet Recommendations Regular solids;Thin liquid Liquid Administration via Cup;Straw Medication Administration Whole meds with puree Compensations Minimize environmental distractions;Slow rate;Small sips/bites Postural Changes Seated upright at 90 degrees   CHL IP OTHER RECOMMENDATIONS 02/10/2020 Recommended Consults -- Oral Care Recommendations Oral care BID Other Recommendations --   CHL IP FOLLOW UP RECOMMENDATIONS 02/10/2020 Follow up Recommendations Home health SLP   CHL IP FREQUENCY AND DURATION 02/10/2020 Speech Therapy Frequency (ACUTE ONLY) min 3x week Treatment Duration 2 weeks      CHL IP ORAL PHASE 02/10/2020 Oral Phase WFL Oral - Pudding Teaspoon -- Oral - Pudding Cup -- Oral - Honey Teaspoon -- Oral - Honey Cup -- Oral - Nectar Teaspoon -- Oral - Nectar Cup -- Oral - Nectar Straw -- Oral - Thin Teaspoon -- Oral - Thin Cup -- Oral - Thin Straw -- Oral - Puree -- Oral - Mech Soft -- Oral - Regular -- Oral - Multi-Consistency -- Oral - Pill -- Oral Phase - Comment --  CHL IP PHARYNGEAL PHASE 02/10/2020 Pharyngeal Phase Impaired Pharyngeal- Pudding Teaspoon -- Pharyngeal -- Pharyngeal- Pudding Cup -- Pharyngeal -- Pharyngeal- Honey Teaspoon -- Pharyngeal -- Pharyngeal- Honey Cup -- Pharyngeal -- Pharyngeal- Nectar Teaspoon -- Pharyngeal -- Pharyngeal- Nectar Cup -- Pharyngeal -- Pharyngeal- Nectar Straw -- Pharyngeal -- Pharyngeal- Thin Teaspoon WFL Pharyngeal -- Pharyngeal- Thin Cup WFL Pharyngeal -- Pharyngeal- Thin Straw Delayed swallow initiation-pyriform sinuses;Penetration/Aspiration during swallow Pharyngeal Material enters airway, remains ABOVE vocal cords then ejected out Pharyngeal- Puree -- Pharyngeal -- Pharyngeal- Mechanical Soft -- Pharyngeal -- Pharyngeal- Regular -- Pharyngeal -- Pharyngeal- Multi-consistency -- Pharyngeal -- Pharyngeal- Pill -- Pharyngeal -- Pharyngeal Comment --  CHL IP CERVICAL ESOPHAGEAL  PHASE 02/10/2020 Cervical Esophageal Phase WFL Pudding Teaspoon -- Pudding Cup -- Honey Teaspoon -- Honey Cup -- Nectar Teaspoon -- Nectar Cup -- Nectar Straw -- Thin Teaspoon -- Thin Cup -- Thin Straw -- Puree -- Mechanical Soft -- Regular -- Multi-consistency -- Pill -- Cervical Esophageal Comment -- PAYNE, COURTNEY 02/10/2020, 9:41 AM  Weston Anna, MA, CCC-SLP [phone]             Medications:  . amiodarone  200 mg Per Tube Daily  . apixaban  2.5 mg Per Tube BID  . ascorbic acid  500 mg Per Tube BID  . benztropine  1 mg Per Tube BID  . clonazePAM  1 mg Per Tube TID WC & HS  . colchicine  0.6 mg Per Tube Daily  . feeding supplement (NEPRO CARB STEADY)  237 mL Oral BID BM  . feeding  supplement (PRO-STAT SUGAR FREE 64)  30 mL Per Tube BID  . folic acid  1 mg Per Tube Daily  . free water  100 mL Per Tube BID  . gabapentin  100 mg Per Tube Q8H  . levothyroxine  25 mcg Per Tube Q0600  . mouth rinse  15 mL Mouth Rinse BID  . Melatonin  3 mg Per Tube QHS  . metoprolol tartrate  25 mg Per Tube TID WC  . midodrine  10 mg Per Tube TID WC  . multivitamin  1 tablet Per Tube QHS  . predniSONE  10 mg Per Tube Q breakfast  . QUEtiapine  25 mg Oral QHS  . thiamine  100 mg Per Tube Daily     Dialysis Orders: Prev orders - will need to change significantly on d/c MWF at [facility], 4:15hr, EDW 74.5kg, 2K/2.5Ca, AVF, heparin 5K - Calcitriol 0.70mcg PO q HD. No ESA or venofer.  Assessment/Plan: 1.Deconditioning: S/p prolonged [facility] admit (12/2-12/23/19) s/p cardiac arrest, then [facility] admit until 2/21. Now CIR. 2. ESRD:Continue HD per MWF schedule - HD today on schedule 3. HTN/volume:BP ok, currently on metoprolol + midodrine. CXR 2/24 with CHF changes - UF prn. 4. Anemia:Hgb 12.3- no ESA needed. 5. Secondary hyperparathyroidism:Ca/Phos ok. No binders/VDRA. 6. Nutrition/dysphagia: TFs + nectars 7. Hx cardiac arrest  8. Hx PE: On Eliquis 9. A-fib: On Eliquis + amiodarone 10. Hx pericardial  effusion -> colchicine + prednisone 10mg  QD. 11. Anxiety + lethargy: Meds per primary. 12. HFrEF (<20%) 13. Hx resp failure with vent/trach: Trach decannulated/healed  Jimmye Norman. Milyn Stapleton NP-C 02/10/2020, 12:33 PM  [employer] [phone]

## 2020-02-10 NOTE — Progress Notes (Signed)
[facility] PHYSICAL MEDICINE & REHABILITATION PROGRESS NOTE  Subjective/Complaints: Up in chair watching the news. Says he slept. Both feet hurt, especially right side.   ROS: Patient denies fever, rash, sore throat, blurred vision, nausea, vomiting, diarrhea, cough, shortness of breath or chest pain,  headache, or mood change.    Objective: Vital Signs: Blood pressure 107/84, pulse 93, temperature 97.9 F (36.6 C), resp. rate 18, height 5' 10.5" (1.791 m), weight 62.9 kg, SpO2 100 %. DG CHEST PORT 1 VIEW  Result Date: 02/08/2020 CLINICAL DATA:  54 year old male with history of shortness of breath. EXAM: PORTABLE CHEST 1 VIEW COMPARISON:  Chest x-ray 02/05/2020. FINDINGS: Peripheral line in the right hemithorax favored to represent skin fold artifact. Elevation of the right hemidiaphragm. Bibasilar opacities (right greater than left), which may reflect areas of atelectasis and/or consolidation. Cephalization of the pulmonary vasculature with ill-defined opacities and areas of interstitial prominence throughout the lungs bilaterally. Moderate cardiomegaly. Upper mediastinal contours are within normal limits. Aortic atherosclerosis. IMPRESSION: 1. Appearance the chest suggests congestive heart failure, as above. 2. Bibasilar areas of atelectasis and/or consolidation. 3. Probable skin fold artifact projecting over the right hemithorax laterally. Follow-up standing PA and lateral chest radiograph is recommended to ensure the resolution of this finding and exclude pneumothorax. Electronically Signed   By: Vinnie Langton M.D.   On: 02/08/2020 12:29   DG Swallowing Func-Speech Pathology  Result Date: 02/10/2020 Objective Swallowing Evaluation: Type of Study: MBS-Modified Barium Swallow Study  Patient Details Name: [name] [name] MRN: [id number] Date of Birth: Mar 03, 1966 Today's Date: 02/10/2020 Time: No data recorded-No data recorded No data recorded Past Medical History: Past Medical History:  Diagnosis Date . Abdominal distension 08/10/2018 . Acute on chronic respiratory failure with hypoxia ([place])  . Acute on chronic systolic and diastolic heart failure, NYHA class 4 ([place])  . Acute pulmonary embolism without acute cor pulmonale (HCC)  . Alcohol abuse 11/26/2010  Qualifier: Diagnosis of  By: [name] MD, Pershing Proud . Anemia due to blood loss, acute 07/15/2012 . CHF (congestive heart failure) ([place])  . Cholecystitis 04/18/2014 . Chronic atrial fibrillation ([place])  . Cigarette smoker   has currently quit . Cocaine abuse ([place]) 11/26/2010  Qualifier: Diagnosis of  By: [name] MD, Benjamine Mola  has been over a year . 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 Echo 8/13: Severe LVH, EF 40-45%, inferoposterior akinesis, grade 2 diastolic dysfunction, moderate LAE, mild RVE, mildly reduced RVSF, mild RAE; cannot rule out R atrial mass-suggest TEE or cardiac MRI  //  B. Echo 3/17: Mild LVH, EF 25-30%, diffuse HK, grade 2 diastolic dysfunction, mild MR, severe LAE,  moderately reduced RVSF, severe RAE, mild TR, moderate PI, PASP 55 mmHg   . Dyspnea  . End stage renal disease on dialysis ([place])  . ESRD (end stage renal disease) on dialysis [facility])   MWF- [place] (05/18/2017) . Hemodialysis patient ([place])   M,W,F . Hernia, inguinal, right 08/05/2016 . Hypertension  . 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 Hypocalcemia 07/15/2012 . Medical non-compliance  . Non-ischemic cardiomyopathy ([place])   A. R/L HC 3/17: Normal coronary arteries, moderate pulmonary hypertension (PASP 65 mmHg), elevated LV filling pressures (LVEDP 45 mmHg)  . NSVT (nonsustained ventricular tachycardia) ([place]) 02/22/2016 . NSVT (nonsustained ventricular tachycardia) ([place]) 02/22/2016 . Pneumonia 11/12/2017 . Polysubstance abuse ([place])  . Prolonged Q-T interval on ECG 05/18/2017 .  Prolonged QT interval  05/18/2017 . Restless leg syndrome  . Solitary pulmonary nodule 10/10/2015  See cxr 10/09/2015 - CT rec 10/10/2015 >>>  . Upper airway cough syndrome 10/09/2015  Off ACEi around 1st Oct 2016  - Sinus CT 10/10/2015 >>>   Past Surgical History: Past Surgical History: Procedure Laterality Date . [place] TRANSPOSITION  01/19/2013  Procedure: BASCILIC VEIN TRANSPOSITION;  Surgeon: Conrad Hiko, MD;  Location: [place];  Service: Vascular;  Laterality: Left;  left 2nd stage basilic vein transposition . CARDIAC CATHETERIZATION N/A 02/25/2016  Procedure: Right/Left Heart Cath and Coronary Angiography;  Surgeon: Burnell Blanks, MD;  Location: [place] CV LAB;  Service: Cardiovascular;  Laterality: N/A; . INGUINAL HERNIA REPAIR Right 08/05/2016  Procedure: RIGHT INGUINAL HERNIA REPAIR WITH MESH;  Surgeon: Donnie Mesa, MD;  Location: [place];  Service: General;  Laterality: Right; . INSERTION OF DIALYSIS CATHETER  07/17/2012  Procedure: INSERTION OF DIALYSIS CATHETER;  Surgeon: Conrad Squ[place], MD;  Location: [place];  Service: Vascular;  Laterality: Right;  right internal jugular . INSERTION OF MESH Right 08/05/2016  Procedure: INSERTION OF MESH;  Surgeon: Donnie Mesa, MD;  Location: [place];  Service: General;  Laterality: Right; . IR GASTROSTOMY TUBE MOD SED  01/09/2020 . IR THORACENTESIS ASP PLEURAL SPACE W/IMG GUIDE  01/12/2020 . REVISION OF ARTERIOVENOUS GORETEX GRAFT Left 08/01/2992  Procedure: PLICATION OF ARTERIOVENOUS FISTULA LEFT ARM;  Surgeon: Elam Dutch, MD;  Location: [place];  Service: Vascular;  Laterality: Left; . REVISON OF ARTERIOVENOUS FISTULA Left 01/06/2017  Procedure: REVISON OF ARTERIOVENOUS FISTULA;  Surgeon: Conrad Corydon, MD;  Location: [place];  Service: Vascular;  Laterality: Left; . REVISON OF ARTERIOVENOUS FISTULA Left 10/27/2018  Procedure: REVISION PLICATION OF ARTERIOVENOUS FISTULA  LEFT UPPER ARM;  Surgeon: Waynetta Sandy, MD;  Location: [place];  Service: Vascular;  Laterality: Left;  . TRACHEOSTOMY TUBE PLACEMENT N/A 11/24/2019  Procedure: TRACHEOSTOMY;  Surgeon: Izora Gala, MD;  Location: [place];  Service: ENT;  Laterality: N/A; . UMBILICAL HERNIA REPAIR N/A 08/05/2016  Procedure: [place];  Surgeon: Donnie Mesa, MD;  Location: [place];  Service: General;  Laterality: N/A; . VENOGRAM N/A 08/09/2012  Procedure: VENOGRAM;  Surgeon: Conrad , MD;  Location: [facility] CATH LAB;  Service: Cardiovascular;  Laterality: N/A; HPI: See H&P  No data recorded Assessment / Plan / Recommendation CHL IP CLINICAL IMPRESSIONS 02/10/2020 Clinical Impression Patient demonstrates a mild pharyngeal dysphagia characterized by inconsistent timing when triggering his swallow resulting in intermittent flash penetration. No episodes of aspiration noted. Patient's oral phase also appeared [facility]. Therefore, recommend patient upgrade to regular textures with thin liquids. Patient verbalize understanding and agreement. SLP Visit Diagnosis Dysphagia, pharyngeal phase (R13.13) Attention and concentration deficit following -- Frontal lobe and executive function deficit following -- Impact on safety and function Mild aspiration risk   CHL IP TREATMENT RECOMMENDATION 02/10/2020 Treatment Recommendations Therapy as outlined in treatment plan below   No flowsheet data found. CHL IP DIET RECOMMENDATION 02/10/2020 SLP Diet Recommendations Regular solids;Thin liquid Liquid Administration via Cup;Straw Medication Administration Whole meds with puree Compensations Minimize environmental distractions;Slow rate;Small sips/bites Postural Changes Seated upright at 90 degrees   CHL IP OTHER RECOMMENDATIONS 02/10/2020 Recommended Consults -- Oral Care Recommendations Oral care BID Other Recommendations --   CHL IP FOLLOW UP RECOMMENDATIONS 02/10/2020 Follow up Recommendations Home health SLP   CHL IP FREQUENCY AND DURATION 02/10/2020 Speech Therapy Frequency (ACUTE ONLY) min 3x week Treatment Duration 2 weeks  CHL IP ORAL  PHASE 02/10/2020 Oral Phase WFL Oral - Pudding Teaspoon -- Oral - Pudding Cup -- Oral - Honey Teaspoon -- Oral - Honey Cup -- Oral - Nectar Teaspoon -- Oral - Nectar Cup -- Oral - Nectar Straw -- Oral - Thin Teaspoon -- Oral - Thin Cup -- Oral - Thin Straw -- Oral - Puree -- Oral - Mech Soft -- Oral - Regular -- Oral - Multi-Consistency -- Oral - Pill -- Oral Phase - Comment --  CHL IP PHARYNGEAL PHASE 02/10/2020 Pharyngeal Phase Impaired Pharyngeal- Pudding Teaspoon -- Pharyngeal -- Pharyngeal- Pudding Cup -- Pharyngeal -- Pharyngeal- Honey Teaspoon -- Pharyngeal -- Pharyngeal- Honey Cup -- Pharyngeal -- Pharyngeal- Nectar Teaspoon -- Pharyngeal -- Pharyngeal- Nectar Cup -- Pharyngeal -- Pharyngeal- Nectar Straw -- Pharyngeal -- Pharyngeal- Thin Teaspoon WFL Pharyngeal -- Pharyngeal- Thin Cup WFL Pharyngeal -- Pharyngeal- Thin Straw Delayed swallow initiation-pyriform sinuses;Penetration/Aspiration during swallow Pharyngeal Material enters airway, remains ABOVE vocal cords then ejected out Pharyngeal- Puree -- Pharyngeal -- Pharyngeal- Mechanical Soft -- Pharyngeal -- Pharyngeal- Regular -- Pharyngeal -- Pharyngeal- Multi-consistency -- Pharyngeal -- Pharyngeal- Pill -- Pharyngeal -- Pharyngeal Comment --  CHL IP CERVICAL ESOPHAGEAL PHASE 02/10/2020 Cervical Esophageal Phase WFL Pudding Teaspoon -- Pudding Cup -- Honey Teaspoon -- Honey Cup -- Nectar Teaspoon -- Nectar Cup -- Nectar Straw -- Thin Teaspoon -- Thin Cup -- Thin Straw -- Puree -- Mechanical Soft -- Regular -- Multi-consistency -- Pill -- Cervical Esophageal Comment -- PAYNE, COURTNEY 02/10/2020, 9:41 AM  Weston Anna, MA, CCC-SLP [phone]             Recent Labs    02/08/20 1254  WBC 10.2  HGB 12.3*  HCT 40.4  PLT 143*   Recent Labs    02/08/20 1240  NA 135  K 4.8  CL 89*  CO2 26  GLUCOSE 108*  BUN 100*  CREATININE 6.25*  CALCIUM 9.4    Physical Exam: BP 107/84 (BP Location: Right Arm)   Pulse 93   Temp 97.9 F (36.6 C)   Resp  18   Ht 5' 10.5" (1.791 m)   Wt 62.9 kg   SpO2 100%   BMI 19.62 kg/m  Constitutional: No distress . Vital signs reviewed. HEENT: EOMI, oral membranes moist Neck: supple Cardiovascular: RRR without murmur. No JVD    Respiratory/Chest: CTA Bilaterally without wheezes or rales. Normal effort    GI/Abdomen: BS +, non-tender, non-distended Ext: sl edema R>L feet Skin:   Bilateral toes gangrenous, both feet cool, especially right Psych: flat but more engaging Musc: No edema in extremities.  No tenderness in extremities. Neurological: alert, dysarthric speech. Processing better. Better awareness Motor: 3+-4 -/5 moves all 4's.    Assessment/Plan: 1. Functional deficits secondary to encephalopathy which require 3+ hours per day of interdisciplinary therapy in a comprehensive inpatient rehab setting.  Physiatrist is providing close team supervision and 24 hour management of active medical problems listed below.  Physiatrist and rehab team continue to assess barriers to discharge/monitor patient progress toward functional and medical goals  Care Tool:  Bathing  Bathing activity did not occur: Refused Body parts bathed by patient: Right arm, Left arm, Chest, Abdomen, Front perineal area, Right lower leg, Left lower leg, Buttocks, Right upper leg, Left upper leg   Body parts bathed by helper: (everything but face)     Bathing assist Assist Level: Total Assistance - Patient < 25%     Upper Body Dressing/Undressing Upper body dressing   What is the  patient wearing?: Pull over shirt    Upper body assist Assist Level: Minimal Assistance - Patient > 75%    Lower Body Dressing/Undressing Lower body dressing      What is the patient wearing?: Underwear/pull up, Pants     Lower body assist Assist for lower body dressing: Total Assistance - Patient < 25%     Toileting Toileting    Toileting assist Assist for toileting: Moderate Assistance - Patient 50 - 74%      Transfers Chair/bed transfer  Transfers assist     Chair/bed transfer assist level: Minimal Assistance - Patient > 75%     Locomotion Ambulation   Ambulation assist      Assist level: Minimal Assistance - Patient > 75% Assistive device: Walker-rolling Max distance: 35'   Walk 10 feet activity   Assist     Assist level: Minimal Assistance - Patient > 75% Assistive device: Walker-rolling   Walk 50 feet activity   Assist    Assist level: Minimal Assistance - Patient > 75% Assistive device: Walker-rolling    Walk 150 feet activity   Assist Walk 150 feet activity did not occur: Safety/medical concerns         Walk 10 feet on uneven surface  activity   Assist Walk 10 feet on uneven surfaces activity did not occur: Safety/medical concerns         Wheelchair     Assist Will patient use wheelchair at discharge?: Yes Type of Wheelchair: Manual    Wheelchair assist level: Minimal Assistance - Patient > 75% Max wheelchair distance: 75    Wheelchair 50 feet with 2 turns activity    Assist        Assist Level: Minimal Assistance - Patient > 75%   Wheelchair 150 feet activity     Assist     Assist Level: Moderate Assistance - Patient 50 - 74%      Medical Problem List and Plan: 1.  Deficits with mobility, endurance, self-care secondary to encephalopathy with debility, PVD with gangrenous toes.  -Continue CIR therapies including PT, OT, and SLP   -pt with baseline cognitive deficits 2.  Antithrombotics: -PE/anticoagulation:  Pharmaceutical: Other (comment)--on low dose Eliquis             -antiplatelet therapy:  N/A 3. Pain Management: Oxycodone or tramadol prn.   2/24: Therapist notes that Mr. Ciano has been having hypersensitivity of his gangrenous toes. Will add low dose Gabapentin 100mg  TID to help with pain-- low dose due to ESRD.  2/26 continue gabapentin---seems to be tolerating 4. Mood: LCSW to follow for evaluation  and support.    See #14             -antipsychotic agents: N/A   See #14 5. Neuropsych: This patient is not fully capable of making decisions on his own behalf. 6. Skin/Wound Care: Dry dressing to bilateral feet.              stoma closing. PEG site intact 7. Fluids/Electrolytes/Nutrition:   2/23 stopped TF  -monitor intake.   8. A fib/NSVT: On amiodarone and metoprolol for rate control.              Rate in 90's on 2/25   -isolated bradycardia yesterday   Monitor with increased activity.  -intermittent SOB, CXR demonstrates CHF. Nephrology working on volume mgt with HD 9.  ESRD: HD MWF at the end of the day to help with activity tolerance.   -volume/weight mgt per  nephrology, given cxr findings Filed Weights   02/08/20 1632 02/09/20 0500 02/10/20 0500  Weight: 62.9 kg 62.8 kg 62.9 kg  10. Pericardial effusion: Has been on colchicine since 1/19 and has been weaned to prednisone 10 mg daily. Last echo 1/29 shows decrease to small effusion. 11. Hypotension: Continue midodrine 10 mg tid.  BP's still soft 2/26 12. Dysphagia:   Eating fairly well now  2/26- had MBS today---upgrade to regular/thins 13. Dry gangrene bilateral feet: Needs to wear shoes for protection. Dry dressing. Maintain adequate nutritional status.    -he is having ongoing pain in feet, right more than left.   -both feet cool, especially right  -wounds with some improvement  -will ask vascular surgery to follow up before he leaves.   -darco shoes to help offload--really needs type with toe cutout 14. Anxiety/depression: On Zoloft 25 mg/day.  Team support.  ECG with prolonged QTC 512  Continue melatonin  Klonopin increased to 1 mg 3 times daily and at bedtime-likely lethargic partly d/t this  2/23 Will add low dose seroquel 25mg  for HS confusion/insomnia  2/24: seroquel stopped d/t ongoing QTC. It was actually lower than last EKG. Think this can be used at low dose as long as we're monitoring  15. Insomnia: Per  father, he sleeps late at night at baseline.   -slept well last night LOS: 7 days A FACE TO FACE EVALUATION WAS PERFORMED  Meredith Staggers 02/10/2020, 10:39 AM

## 2020-02-10 NOTE — Plan of Care (Signed)
  Problem: RH Car Transfers Goal: LTG Patient will perform car transfers with assist (PT) Description: LTG: Patient will perform car transfers with assistance (PT). Flowsheets (Taken 02/10/2020 1226) LTG: Pt will perform car transfers with assist:: (upgrade 2/2 progress) Contact Guard/Touching assist Note: Upgrade 2/2 progress

## 2020-02-10 NOTE — Progress Notes (Signed)
Occupational Therapy Weekly Progress Note  Patient Details  Name: [name] [name] MRN: [id number] Date of Birth: 12-Nov-1966  Beginning of progress report period: February 04, 2020 End of progress report period: February 10, 2020  Today's Date: 02/10/2020 OT Individual Time: 2780-0447 OT Individual Time Calculation (min): 45 min    Pt is making steady progress towards OT goals.  Short term goals not set initially 2/2 ELOS, however, pt with lethargy that limited his participation in therapy at the beginning of the week. For the last few days, pt has been much more alert and participating well in therapies. Pt was at an overall min/mod A for sit<>stands within BADLs, and CGA for ambulation with RW. Pt still needs up to mod A LB bathing/dressing, but UB bathing/dressing is at min/supervision. Continue current POC.  Patient continues to demonstrate the following deficits: muscle weakness, decreased cardiorespiratoy endurance and decreased oxygen support, abnormal tone and unbalanced muscle activation, decreased initiation, decreased problem solving, decreased safety awareness and decreased memory and decreased sitting balance, decreased standing balance, decreased postural control, hemiplegia and decreased balance strategies and therefore will continue to benefit from skilled OT intervention to enhance overall performance with BADL and Reduce care partner burden.  See Patient's Care Plan for progression toward long term goals.  Patient progressing toward long term goals..  Continue plan of care.    Skilled Therapeutic Interventions/Progress Updates:    Pt greeted sitting in recliner finishing breakfast with nurse tech present, handoff to OT. Pt finished last few bites of food and did not need any physical assist for self-feeding, even opening containers this am. Pt agreeable to wash up at the sink. Pt ambulated to the sink with min A sit<>stand w/ RW and CGA to ambulate to the sink. Pt tolerated  standing for 10 minutes to wash lower body and upper trunk. Worked on LB dressing strategies using figure 4 position, but pt still needed min/mod A overall. Improved sit<>stand with repetition. Oxygen removed while pt washing upper body and changing shirt. SpO2 down to 87 on room air during activity. 2L O2 placed for the rest of ADLs with SpO2 above 90. Pt brushed teeth at the sink with supervision. Stand-pivot back to recliner with min A. Pt left with chair alarm on, call bell in reach, and needs met.    Therapy Documentation Precautions:  Precautions Precautions: Fall Precaution Comments: Peg, Darco shoes Restrictions Weight Bearing Restrictions: No Pain: Pain Assessment Pain Scale: 0-10 Pain Score: 0-No pain  Therapy/Group: Individual Therapy  Valma Cava 02/10/2020, 3:13 PM

## 2020-02-10 NOTE — Progress Notes (Addendum)
  Progress Note    02/10/2020 2:12 PM   Subjective:  Pt seen on HD; c/o pain in his feet.   Vitals:   02/10/20 1330 02/10/20 1400  BP: 102/77 100/74  Pulse: 98 99  Resp:    Temp:    SpO2:      Physical Exam: Extremities:    Left foot 02/10/2020  Left foot 02/10/2020  Right foot 02/10/2020  Right foot 02/10/2020     CBC    Component Value Date/Time   WBC 6.9 02/10/2020 1149   RBC 4.50 02/10/2020 1149   HGB 12.3 (L) 02/10/2020 1149   HGB 12.3 (L) 12/31/2017 1012   HCT 41.1 02/10/2020 1149   HCT 37.4 (L) 12/31/2017 1012   PLT 167 02/10/2020 1149   PLT 176 12/31/2017 1012   MCV 91.3 02/10/2020 1149   MCV 99 (H) 12/31/2017 1012   MCH 27.3 02/10/2020 1149   MCHC 29.9 (L) 02/10/2020 1149   RDW 21.2 (H) 02/10/2020 1149   RDW 18.8 (H) 12/31/2017 1012   LYMPHSABS 1.1 12/17/2019 1504   LYMPHSABS 1.0 12/31/2017 1012   MONOABS 1.7 (H) 12/17/2019 1504   EOSABS 0.5 12/17/2019 1504   EOSABS 0.1 12/31/2017 1012   BASOSABS 0.1 12/17/2019 1504   BASOSABS 0.0 12/31/2017 1012    BMET    Component Value Date/Time   NA 136 02/10/2020 1149   K 5.5 (H) 02/10/2020 1149   CL 95 (L) 02/10/2020 1149   CO2 24 02/10/2020 1149   GLUCOSE 116 (H) 02/10/2020 1149   BUN 80 (H) 02/10/2020 1149   CREATININE 5.59 (H) 02/10/2020 1149   CALCIUM 8.9 02/10/2020 1149   CALCIUM 8.6 (L) 12/23/2019 0323   GFRNONAA 11 (L) 02/10/2020 1149   GFRAA 13 (L) 02/10/2020 1149    INR    Component Value Date/Time   INR 1.6 (H) 11/16/2019 2005     Intake/Output Summary (Last 24 hours) at 02/10/2020 1412 Last data filed at 02/10/2020 0904 Gross per 24 hour  Intake 360 ml  Output --  Net 360 ml     Assessment/Plan:  54 y.o. male who presented with cardiac arrest and required pressorsfor extended time now with ischemic changes to BLE  -pt now in CIR from [facility].   -he has more swelling in his feet and I am unable to palpate his pulses.   -feet are improving but there are still some  gangrenous areas to the great toe and 2nd toe on the left and tip of the right great toe.   -would recommend elevating feet when in bed to help with swelling.- -Dr. Trula Slade to evaluate pt later today .  Doppler not available in HD.   -of note, not able to edit picture descriptions as two of them are labeled right and are actually the left foot.   Leontine Locket, PA-C [facility] [phone] 02/10/2020 2:12 PM   I agree with the above.  I have seen and examined the patient.  His toes have significantly improved, but he does have some drainage from his right great toe.  He has palpable DP pulses on both feet.  He is refusing amputation at this time..  I feel we can continue to monitor his toes for now as long as he does not have fevers, an elevated WBC or cellulitis.  Annamarie Major

## 2020-02-10 NOTE — Progress Notes (Addendum)
Physical Therapy Session Note  Patient Details  Name: [name] [name] MRN: [id number] Date of Birth: Oct 08, 1966  Today's Date: 02/10/2020 PT Individual Time: 1101-1111 PT Individual Time Calculation (min): 10 min   Short Term Goals: Week 1:  PT Short Term Goal 1 (Week 1): Pt will transfer to and from [facility] with min assist. PT Short Term Goal 2 (Week 1): Pt will ambulate 69ft with min assist and RW PT Short Term Goal 3 (Week 1): Pt will attend to task >5 mintues with only min cues from PT PT Short Term Goal 4 (Week 1): Pt will performed bed mobility with CGA  Skilled Therapeutic Interventions/Progress Updates:  Pt received in recliner with LCSW in room. Pt agreeable to tx when transporter arrived to take pt to dialysis. Pt declines donning darco shoes for stand pivot back to bed. Pt transfers sit>stand and stand pivot recliner>bed with RW & CGA. Pt transfers sit>supine with supervision, HOB elevated, extra time & using BUE to assist BLE onto bed. Pt is able to roll L<>R with bed rails, HOB elevated, and supervision and scoots to [facility] with use of bed rails. Pt left in bed on 2L/min supplemental oxygen in care of transporter.   No formal c/o pain but pt indicates B feet are still very sensitive.  Therapy Documentation Precautions:  Precautions Precautions: Fall Precaution Comments: Peg, Darco shoes Restrictions Weight Bearing Restrictions: No   General: PT Amount of Missed Time (min): 50 Minutes PT Missed Treatment Reason: (pt being taken off unit for dialysis)     Therapy/Group: Individual Therapy  [name] 02/10/2020, 11:24 AM

## 2020-02-10 NOTE — Progress Notes (Signed)
Modified Barium Swallow Progress Note  Patient Details  Name: [name] [name] MRN: [id number] Date of Birth: 07-19-1966  Today's Date: 02/10/2020  Modified Barium Swallow completed.  Full report located under Chart Review in the Imaging Section.  Brief recommendations include the following:  Clinical Impression  Patient demonstrates a mild pharyngeal dysphagia characterized by inconsistent timing when triggering his swallow resulting in intermittent flash penetration. No episodes of aspiration noted. Patient's oral phase also appeared [facility]. Therefore, recommend patient upgrade to regular textures with thin liquids. Patient verbalize understanding and agreement.   Swallow Evaluation Recommendations       SLP Diet Recommendations: Regular solids;Thin liquid   Liquid Administration via: Cup;Straw   Medication Administration: Whole meds with puree   Supervision: Patient able to self feed;Intermittent supervision to cue for compensatory strategies   Compensations: Minimize environmental distractions;Slow rate;Small sips/bites   Postural Changes: Seated upright at 90 degrees   Oral Care Recommendations: Oral care BID        Charnay Nazario 02/10/2020,9:33 AM

## 2020-02-11 ENCOUNTER — Inpatient Hospital Stay (HOSPITAL_COMMUNITY): Payer: Medicare Other | Admitting: Occupational Therapy

## 2020-02-11 ENCOUNTER — Inpatient Hospital Stay (HOSPITAL_COMMUNITY): Payer: Medicare Other

## 2020-02-11 ENCOUNTER — Inpatient Hospital Stay (HOSPITAL_COMMUNITY): Payer: Medicare Other | Admitting: Speech Pathology

## 2020-02-11 MED ORDER — CLONAZEPAM 0.5 MG PO TABS
1.0000 mg | ORAL_TABLET | Freq: Three times a day (TID) | ORAL | Status: DC
  Administered 2020-02-12: 1 mg
  Filled 2020-02-11: qty 2

## 2020-02-11 NOTE — Progress Notes (Signed)
Pt has been awake the entire shift. Pt has requested to move from bed to chair and back 6xs throughout shift. Peg tube dressing has been replaced 3xs this shift as pt continues to pull at it until it comes off. Pt c/o foot pain, prn medication is only mildly effective. Pt is noncompliant with keeping feet elevated, using ice compresses, and continues to rub dressings to loosen them. Klonopin scheduled at qhs was ineffective. Bed/chair alarm and Telesitter necessary for pt safety d/t impulsiveness.

## 2020-02-11 NOTE — Progress Notes (Signed)
Occupational Therapy Session Note  Patient Details  Name: [name] [name] MRN: [id number] Date of Birth: 1966-10-30  Today's Date: 02/11/2020 OT Individual Time: 8811-0315 OT Individual Time Calculation (min): 55 min    Short Term Goals: Week 2:  OT Short Term Goal 1 (Week 2): Pt will complete sit<>stand with no more than CGA within BADL tasks OT Short Term Goal 2 (Week 2): Pt will complete LB dressing with no more than min A OT Short Term Goal 3 (Week 2): Pt will don [facility] boots with no more than min A  Skilled Therapeutic Interventions/Progress Updates:    1:1. Pt received in recliner awake with no lethargy this session. All mobility completed with CGA only to hold pants from falling off since pt has lost so much weight with VC for RW management and reach back. Pt completes bathing at sink level with setup for UB and CGA for peri area in standing. Pt dons new shirt with set up and elects to keep same pants. DARCO shoes donned at beginning of session with MOD VC for problem solving crossing into figure 4. Pt stands with 3# wrist weights on BUE to completes standing ball toss to rebounder to improve standing balance and coordination of BUE required for BADLs. Exited session with pt ambulating to recliner in room, call light in reach and belt alarm on  Therapy Documentation Precautions:  Precautions Precautions: Fall Precaution Comments: Peg, Darco shoes Restrictions Weight Bearing Restrictions: No General:   Vital Signs:   Pain: Pain Assessment Pain Scale: 0-10 Pain Score: 4  ADL:   Vision   Perception    Praxis   Exercises:   Other Treatments:     Therapy/Group: Individual Therapy  Tonny Branch 02/11/2020, 8:00 AM

## 2020-02-11 NOTE — Progress Notes (Addendum)
Speech Language Pathology Daily Session Note  Patient Details  Name: [name] [name] MRN: [id number] Date of Birth: [birth year]  Today's Date: 02/11/2020 SLP Individual Time: 1015-1110 SLP Individual Time Calculation (min): 55 min  Short Term Goals: Week 2: SLP Short Term Goal 1 (Week 2): Patient will consume current diet with minimal overt s/s of aspiration with Mod I for use of swallowing compensatory strategies. SLP Short Term Goal 2 (Week 2): Patient wil utilize an increased vocal intensity at the phrase level to achieve ~75% intelligibility with Min A verbal cues. SLP Short Term Goal 3 (Week 2): Patient will recall new, daily information with Min verbal cues for use of compensatory strategies. SLP Short Term Goal 5 (Week 2): Patient will demonstrate sustained attention to functional tasks for 45 minutes with supevision verbal cues for redirection.  Skilled Therapeutic Interventions: Patient received skilled SLP services targeting cognitive goals. Patient participated in an attention task sorting a deck of cards by color/number with 80% accuracy and min verbal cues. Patient participated in a familiar card game recalling the rules of the card game with min verbal cues. Patient benefited from min verbal cues for re-direction during card game to attend to task. SLP facilitated note taking as an external compensatory memory strategy with patient creating a grocery list of 10 items he needs at the store with min verbal cues. Patient required mod verbal cues to identify 3 tasks he completed with therapy yesterday. At the end of therapy session patient was upright in the chair, chair alarm activated, and all needs within reach.  Pain Pain Assessment Pain Scale: Faces Faces Pain Scale: No hurt   Therapy/Group: Individual Therapy  Cristy Folks 02/11/2020, 11:14 AM

## 2020-02-11 NOTE — Progress Notes (Signed)
[place] PHYSICAL MEDICINE & REHABILITATION PROGRESS NOTE  Subjective/Complaints: RN reports patient was up the better portion of the night confused, anxious, etc. Klonopin ineffective. telesitter used  ROS: Limited due to cognitive/behavioral    Objective: Vital Signs: Blood pressure 105/75, pulse 100, temperature 98.4 F (36.9 C), resp. rate 17, height 5' 10.5" (1.791 m), weight 62.9 kg, SpO2 100 %. DG Swallowing Func-Speech Pathology  Result Date: 02/10/2020 Objective Swallowing Evaluation: Type of Study: MBS-Modified Barium Swallow Study  Patient Details Name: [name] [name] MRN: [id number] Date of Birth: [birth year] Today's Date: 02/10/2020 Time: No data recorded-No data recorded No data recorded Past Medical History: Past Medical History: Diagnosis Date . Abdominal distension 08/10/2018 . Acute on chronic respiratory failure with hypoxia ([place])  . Acute on chronic systolic and diastolic heart failure, NYHA class 4 ([place])  . Acute pulmonary embolism without acute cor pulmonale (HCC)  . Alcohol abuse 11/26/2010  Qualifier: Diagnosis of  By: [name] MD, Pershing Proud . Anemia due to blood loss, acute 07/15/2012 . CHF (congestive heart failure) ([place])  . Cholecystitis 04/18/2014 . Chronic atrial fibrillation ([place])  . Cigarette smoker   has currently quit . Cocaine abuse ([place]) 11/26/2010  Qualifier: Diagnosis of  By: [name] MD, Benjamine Mola  has been over a year . 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 Echo 8/13: Severe LVH, EF 40-45%, inferoposterior akinesis, grade 2 diastolic dysfunction, moderate LAE, mild RVE, mildly reduced RVSF, mild RAE; cannot rule out R atrial mass-suggest TEE or cardiac MRI  //  B. Echo 3/17: Mild LVH, EF 25-30%, diffuse HK, grade 2 diastolic dysfunction, mild MR, severe LAE,  moderately reduced RVSF, severe RAE, mild TR, moderate PI, PASP 55 mmHg   . Dyspnea  . End stage renal disease on dialysis ([place])  . ESRD (end stage renal  disease) on dialysis [facility])   MWF- East  (05/18/2017) . Hemodialysis patient ([place])   M,W,F . Hernia, inguinal, right 08/05/2016 . Hypertension  . 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 Hypocalcemia 07/15/2012 . Medical non-compliance  . Non-ischemic cardiomyopathy ([place])   A. R/L HC 3/17: Normal coronary arteries, moderate pulmonary hypertension (PASP 65 mmHg), elevated LV filling pressures (LVEDP 45 mmHg)  . NSVT (nonsustained ventricular tachycardia) ([place]) 02/22/2016 . NSVT (nonsustained ventricular tachycardia) ([place]) 02/22/2016 . Pneumonia 11/12/2017 . Polysubstance abuse ([place])  . Prolonged Q-T interval on ECG 05/18/2017 . Prolonged QT interval 05/18/2017 . Restless leg syndrome  . Solitary pulmonary nodule 10/10/2015  See cxr 10/09/2015 - CT rec 10/10/2015 >>>  . Upper airway cough syndrome 10/09/2015  Off ACEi around 1st Oct 2016  - Sinus CT 10/10/2015 >>>   Past Surgical History: Past Surgical History: Procedure Laterality Date . [place] TRANSPOSITION  01/19/2013  Procedure: BASCILIC VEIN TRANSPOSITION;  Surgeon: Conrad Sonterra, MD;  Location: [place];  Service: Vascular;  Laterality: Left;  left 2nd stage basilic vein transposition . CARDIAC CATHETERIZATION N/A 02/25/2016  Procedure: Right/Left Heart Cath and Coronary Angiography;  Surgeon: Burnell Blanks, MD;  Location: [place] CV LAB;  Service: Cardiovascular;  Laterality: N/A; . INGUINAL HERNIA REPAIR Right 08/05/2016  Procedure: RIGHT INGUINAL HERNIA REPAIR WITH MESH;  Surgeon: Donnie Mesa, MD;  Location: [place];  Service: General;  Laterality: Right; . INSERTION OF DIALYSIS CATHETER  07/17/2012  Procedure: INSERTION OF DIALYSIS CATHETER;  Surgeon: Conrad [place]town, MD;  Location: [place];  Service: Vascular;  Laterality: Right;  right internal jugular . INSERTION OF MESH Right 08/05/2016  Procedure: INSERTION OF MESH;  Surgeon: Donnie Mesa, MD;  Location: [place];  Service: General;  Laterality: Right; . IR GASTROSTOMY TUBE MOD SED  01/09/2020 . IR THORACENTESIS ASP PLEURAL SPACE W/IMG GUIDE  01/12/2020 . REVISION OF ARTERIOVENOUS GORETEX GRAFT Left 7/82/9562  Procedure: PLICATION OF ARTERIOVENOUS FISTULA LEFT ARM;  Surgeon: Elam Dutch, MD;  Location: [place];  Service: Vascular;  Laterality: Left; . REVISON OF ARTERIOVENOUS FISTULA Left 01/06/2017  Procedure: REVISON OF ARTERIOVENOUS FISTULA;  Surgeon: Conrad Butler, MD;  Location: [place];  Service: Vascular;  Laterality: Left; . REVISON OF ARTERIOVENOUS FISTULA Left 10/27/2018  Procedure: REVISION PLICATION OF ARTERIOVENOUS FISTULA  LEFT UPPER ARM;  Surgeon: Waynetta Sandy, MD;  Location: [place];  Service: Vascular;  Laterality: Left; . TRACHEOSTOMY TUBE PLACEMENT N/A 11/24/2019  Procedure: TRACHEOSTOMY;  Surgeon: Izora Gala, MD;  Location: [place];  Service: ENT;  Laterality: N/A; . UMBILICAL HERNIA REPAIR N/A 08/05/2016  Procedure: [place];  Surgeon: Donnie Mesa, MD;  Location: [place];  Service: General;  Laterality: N/A; . VENOGRAM N/A 08/09/2012  Procedure: VENOGRAM;  Surgeon: Conrad Holliday, MD;  Location: [facility] CATH LAB;  Service: Cardiovascular;  Laterality: N/A; HPI: See H&P  No data recorded Assessment / Plan / Recommendation CHL IP CLINICAL IMPRESSIONS 02/10/2020 Clinical Impression Patient demonstrates a mild pharyngeal dysphagia characterized by inconsistent timing when triggering his swallow resulting in intermittent flash penetration. No episodes of aspiration noted. Patient's oral phase also appeared [facility]. Therefore, recommend patient upgrade to regular textures with thin liquids. Patient verbalize understanding and agreement. SLP Visit Diagnosis Dysphagia, pharyngeal phase (R13.13) Attention and concentration deficit following -- Frontal lobe and executive function deficit following -- Impact on safety and function Mild aspiration risk   CHL IP  TREATMENT RECOMMENDATION 02/10/2020 Treatment Recommendations Therapy as outlined in treatment plan below   No flowsheet data found. CHL IP DIET RECOMMENDATION 02/10/2020 SLP Diet Recommendations Regular solids;Thin liquid Liquid Administration via Cup;Straw Medication Administration Whole meds with puree Compensations Minimize environmental distractions;Slow rate;Small sips/bites Postural Changes Seated upright at 90 degrees   CHL IP OTHER RECOMMENDATIONS 02/10/2020 Recommended Consults -- Oral Care Recommendations Oral care BID Other Recommendations --   CHL IP FOLLOW UP RECOMMENDATIONS 02/10/2020 Follow up Recommendations Home health SLP   CHL IP FREQUENCY AND DURATION 02/10/2020 Speech Therapy Frequency (ACUTE ONLY) min 3x week Treatment Duration 2 weeks      CHL IP ORAL PHASE 02/10/2020 Oral Phase WFL Oral - Pudding Teaspoon -- Oral - Pudding Cup -- Oral - Honey Teaspoon -- Oral - Honey Cup -- Oral - Nectar Teaspoon -- Oral - Nectar Cup -- Oral - Nectar Straw -- Oral - Thin Teaspoon -- Oral - Thin Cup -- Oral - Thin Straw -- Oral - Puree -- Oral - Mech Soft -- Oral - Regular -- Oral - Multi-Consistency -- Oral - Pill -- Oral Phase - Comment --  CHL IP PHARYNGEAL PHASE 02/10/2020 Pharyngeal Phase Impaired Pharyngeal- Pudding Teaspoon -- Pharyngeal -- Pharyngeal- Pudding Cup -- Pharyngeal -- Pharyngeal- Honey Teaspoon -- Pharyngeal -- Pharyngeal- Honey Cup -- Pharyngeal -- Pharyngeal- Nectar Teaspoon -- Pharyngeal -- Pharyngeal- Nectar Cup -- Pharyngeal -- Pharyngeal- Nectar Straw -- Pharyngeal -- Pharyngeal- Thin Teaspoon WFL Pharyngeal -- Pharyngeal- Thin Cup WFL Pharyngeal -- Pharyngeal- Thin Straw Delayed swallow initiation-pyriform sinuses;Penetration/Aspiration during swallow Pharyngeal Material enters airway, remains ABOVE vocal cords then ejected out  Pharyngeal- Puree -- Pharyngeal -- Pharyngeal- Mechanical Soft -- Pharyngeal -- Pharyngeal- Regular -- Pharyngeal -- Pharyngeal- Multi-consistency -- Pharyngeal --  Pharyngeal- Pill -- Pharyngeal -- Pharyngeal Comment --  CHL IP CERVICAL ESOPHAGEAL PHASE 02/10/2020 Cervical Esophageal Phase WFL Pudding Teaspoon -- Pudding Cup -- Honey Teaspoon -- Honey Cup -- Nectar Teaspoon -- Nectar Cup -- Nectar Straw -- Thin Teaspoon -- Thin Cup -- Thin Straw -- Puree -- Mechanical Soft -- Regular -- Multi-consistency -- Pill -- Cervical Esophageal Comment -- PAYNE, COURTNEY 02/10/2020, 9:41 AM  Weston Anna, MA, CCC-SLP [phone]             Recent Labs    02/10/20 1149 02/10/20 1836  WBC 6.9 5.4  HGB 12.3* 13.1  HCT 41.1 43.8  PLT 167 154   Recent Labs    02/10/20 1149 02/10/20 1930  NA 136 138  K 5.5* 4.3  CL 95* 94*  CO2 24 26  GLUCOSE 116* 130*  BUN 80* 26*  CREATININE 5.59* 3.09*  CALCIUM 8.9 8.8*    Physical Exam: BP 105/75   Pulse 100   Temp 98.4 F (36.9 C)   Resp 17   Ht 5' 10.5" (1.791 m)   Wt 62.9 kg   SpO2 100%   BMI 19.62 kg/m  Constitutional: No distress . Vital signs reviewed. HEENT: EOMI, oral membranes moist Neck: supple Cardiovascular: RRR without murmur. No JVD    Respiratory/Chest: CTA Bilaterally without wheezes or rales. Normal effort    GI/Abdomen: BS +, non-tender, non-distended Ext: feet both cool Skin:   Bilateral toes gangrenous, both feet cool, especially right--no changes Psych: flat but more engaging Musc: No edema in extremities.  No tenderness in extremities. Neurological: alert, dysarthric speech. Processing better. Better awareness Motor: 3+-4 -/5 moves all 4's.    Assessment/Plan: 1. Functional deficits secondary to encephalopathy which require 3+ hours per day of interdisciplinary therapy in a comprehensive inpatient rehab setting.  Physiatrist is providing close team supervision and 24 hour management of active medical problems listed below.  Physiatrist and rehab team continue to assess barriers to discharge/monitor patient progress toward functional and medical goals  Care Tool:  Bathing   Bathing activity did not occur: Refused Body parts bathed by patient: Right arm, Left arm, Chest, Abdomen, Front perineal area, Right lower leg, Left lower leg, Buttocks, Right upper leg, Left upper leg   Body parts bathed by helper: Abdomen, Right lower leg, Left lower leg     Bathing assist Assist Level: Total Assistance - Patient < 25%     Upper Body Dressing/Undressing Upper body dressing Upper body dressing/undressing activity did not occur (including orthotics): N/A What is the patient wearing?: Pull over shirt    Upper body assist Assist Level: Minimal Assistance - Patient > 75%    Lower Body Dressing/Undressing Lower body dressing    Lower body dressing activity did not occur: N/A What is the patient wearing?: Underwear/pull up, Pants(sweat pants)     Lower body assist Assist for lower body dressing: Total Assistance - Patient < 25%     Toileting Toileting    Toileting assist Assist for toileting: Moderate Assistance - Patient 50 - 74%     Transfers Chair/bed transfer  Transfers assist     Chair/bed transfer assist level: Contact Guard/Touching assist     Locomotion Ambulation   Ambulation assist   Ambulation activity did not occur: N/A  Assist level: Minimal Assistance - Patient > 75% Assistive device: Walker-rolling Max distance: 72'  Walk 10 feet activity   Assist     Assist level: Minimal Assistance - Patient > 75% Assistive device: Walker-rolling   Walk 50 feet activity   Assist    Assist level: Minimal Assistance - Patient > 75% Assistive device: Walker-rolling    Walk 150 feet activity   Assist Walk 150 feet activity did not occur: Safety/medical concerns         Walk 10 feet on uneven surface  activity   Assist Walk 10 feet on uneven surfaces activity did not occur: Safety/medical concerns         Wheelchair     Assist Will patient use wheelchair at discharge?: Yes Type of Wheelchair: Manual Wheelchair  activity did not occur: N/A  Wheelchair assist level: Minimal Assistance - Patient > 75% Max wheelchair distance: 75    Wheelchair 50 feet with 2 turns activity    Assist        Assist Level: Minimal Assistance - Patient > 75%   Wheelchair 150 feet activity     Assist     Assist Level: Moderate Assistance - Patient 50 - 74%      Medical Problem List and Plan: 1.  Deficits with mobility, endurance, self-care secondary to encephalopathy with debility, PVD with gangrenous toes.  -Continue CIR therapies including PT, OT, and SLP   -pt with baseline cognitive deficits 2.  Antithrombotics: -PE/anticoagulation:  Pharmaceutical: Other (comment)--on low dose Eliquis             -antiplatelet therapy:  N/A 3. Pain Management: Oxycodone or tramadol prn.   2/24: Therapist notes that Mr. Kolton has been having hypersensitivity of his gangrenous toes. Will add low dose Gabapentin 100mg  TID to help with pain-- low dose due to ESRD.  2/27 continue gabapentin---seems to be tolerating 4. Mood: LCSW to follow for evaluation and support.    See #14             -antipsychotic agents: N/A   See #14 5. Neuropsych: This patient is not fully capable of making decisions on his own behalf. 6. Skin/Wound Care: Dry dressing to bilateral feet.              stoma closing. PEG site intact 7. Fluids/Electrolytes/Nutrition:   2/23 stopped TF  -monitor intake.   8. A fib/NSVT: On amiodarone and metoprolol for rate control.              Rate in 90's on 2/25   -isolated bradycardia yesterday   Monitor with increased activity.  -intermittent SOB, CXR demonstrates CHF. Nephrology working on volume mgt with HD 9.  ESRD: HD MWF at the end of the day to help with activity tolerance.   -volume/weight mgt per nephrology, given cxr findings Filed Weights   02/10/20 1151 02/10/20 1545 02/11/20 0500  Weight: 64.4 kg 61.9 kg 62.9 kg  10. Pericardial effusion: Has been on colchicine since 1/19 and has  been weaned to prednisone 10 mg daily. Last echo 1/29 shows decrease to small effusion. 11. Hypotension: Continue midodrine 10 mg tid.  BP's still soft 2/26 12. Dysphagia:   Eating fairly well now  2/26- had MBS today---upgrade to regular/thins 13. Dry gangrene bilateral feet: Needs to wear shoes for protection. Dry dressing. Maintain adequate nutritional status.    -he is having ongoing pain in feet, right more than left.   -both feet cool, especially right  -wounds with some improvement but demonstrate ongoing gangrene as well  -vascular PA has seen patient,  attending to follow up  -continue local care, elevation 14. Anxiety/depression: On Zoloft 25 mg/day.  Team support.  ECG with prolonged QTC 512  Continue melatonin  Klonopin increased to 1 mg 3 times daily ---dc bedtime dose-likely lethargic partly d/t this---avoid excessive use 15. Insomnia: Per father, he sleeps late at night at baseline.   -2/27 still having difficulties more often than not at night d/t confusion/agitation/anxiety   -resume seroquel 25mg  qhs   -QTC actually was DECREASED after giving seroquel the other day.   LOS: 8 days A FACE TO FACE EVALUATION WAS PERFORMED  Meredith Staggers 02/11/2020, 9:16 AM

## 2020-02-11 NOTE — Progress Notes (Addendum)
[facility] Progress Note   Subjective:  Awake/conversant/calm this morning, although see hospitalist note indicating that was up most of the night with confusion/agitation. No CP/dyspnea.  Objective Vitals:   02/10/20 1545 02/10/20 2000 02/11/20 0349 02/11/20 0500  BP: 103/80 104/75 105/75   Pulse: 99 99 100   Resp: 16 17 17    Temp: 98.1 F (36.7 C) 97.7 F (36.5 C) 98.4 F (36.9 C)   TempSrc: Oral     SpO2: 97% 100% 100%   Weight: 61.9 kg   62.9 kg  Height:       Physical Exam General: More well appearing, NAD  Heart: RRR; no murmur Lungs: CTAB Abdomen: soft, non-tender. PEG in place. Extremities: No LE edema above boots - also per VVS note, there is pedal edema. Dialysis Access: L AVF + thrill  Additional Objective Labs: Basic Metabolic Panel: Recent Labs  Lab 02/08/20 1240 02/10/20 1149 02/10/20 1930  NA 135 136 138  K 4.8 5.5* 4.3  CL 89* 95* 94*  CO2 26 24 26   GLUCOSE 108* 116* 130*  BUN 100* 80* 26*  CREATININE 6.25* 5.59* 3.09*  CALCIUM 9.4 8.9 8.8*  PHOS 3.6 3.0 2.1*   Liver Function Tests: Recent Labs  Lab 02/04/20 1231 02/04/20 1231 02/05/20 1748 02/06/20 1322 02/08/20 1240 02/10/20 1149 02/10/20 1930  AST 23  --  15  --   --   --   --   ALT 26  --  19  --   --   --   --   ALKPHOS 119  --  120  --   --   --   --   BILITOT 0.8  --  0.7  --   --   --   --   PROT 7.2  --  7.3  --   --   --   --   ALBUMIN 3.9   < > 3.7   < > 3.7 3.5 3.9   < > = values in this interval not displayed.   CBC: Recent Labs  Lab 02/05/20 1748 02/05/20 1748 02/06/20 1322 02/06/20 1322 02/08/20 1254 02/10/20 1149 02/10/20 1836  WBC 12.1*   < > 10.6*   < > 10.2 6.9 5.4  HGB 13.2   < > 13.1   < > 12.3* 12.3* 13.1  HCT 42.7   < > 43.1   < > 40.4 41.1 43.8  MCV 89.3  --  89.0  --  89.4 91.3 92.0  PLT 120*   < > 151   < > 143* 167 154   < > = values in this interval not displayed.    Medications:  . amiodarone  200 mg Per Tube Daily  .  apixaban  2.5 mg Per Tube BID  . ascorbic acid  500 mg Per Tube BID  . benztropine  1 mg Per Tube BID  . clonazePAM  1 mg Per Tube TID WC  . colchicine  0.6 mg Per Tube Daily  . feeding supplement (NEPRO CARB STEADY)  237 mL Oral BID BM  . feeding supplement (PRO-STAT SUGAR FREE 64)  30 mL Per Tube BID  . folic acid  1 mg Per Tube Daily  . free water  100 mL Per Tube BID  . gabapentin  100 mg Per Tube Q8H  . levothyroxine  25 mcg Per Tube Q0600  . mouth rinse  15 mL Mouth Rinse BID  . Melatonin  3 mg Per Tube QHS  .  metoprolol tartrate  25 mg Per Tube TID WC  . midodrine  10 mg Per Tube TID WC  . multivitamin  1 tablet Per Tube QHS  . predniSONE  10 mg Per Tube Q breakfast  . QUEtiapine  25 mg Oral QHS  . thiamine  100 mg Per Tube Daily    Dialysis Orders: Prev orders - will need to change significantly on d/c MWF at [facility], 4:15hr, EDW 74.5kg, 2K/2.5Ca, AVF, heparin 5K - Calcitriol 0.55mcg PO q HD. No ESA or venofer.  Assessment/Plan: 1.Deconditioning: S/p prolonged [facility] admit (12/2-12/23/19) s/p cardiac arrest, then [facility] until 2/21. Now CIR. 2. ESRD:Continue HD per MWF schedule- next HD 3/1. 3. HTN/volume:BP ok, currently on metoprolol + midodrine.CXR 2/24 with CHF changes - UF prn. Tolerated 2.5 L net UF yest on HD w/ stable BP's > 100.  Cont to lower vol w/ next HD Monday. Will repeat CXR Sunday am.  4. Anemia:Hgb13.1- no ESA needed. 5. Secondary hyperparathyroidism:Ca/Phos ok. No binders/VDRA. 6. Nutrition/dysphagia: TFs + nectars 7. Hx cardiac arrest  8. Hx PE: On Eliquis 9. A-fib: On Eliquis + amiodarone 10. Hx pericardial effusion ->colchicine +prednisone 10mg  QD. 11. Anxiety + lethargy: Meds per primary. 12. HFrEF (<20%) 13. Hx resp failure with vent/trach: Trach decannulated/healed  14. Toe gangrene: S/p prolonged pressor use/ischemic changes to BLE - slightly improved. VVS following  Veneta Penton, PA-C 02/11/2020, 9:30 AM  [facility]  Pt seen, examined and agree w assess/plan as above with additions as indicated.  [facility] 02/11/2020, 7:08 PM

## 2020-02-11 NOTE — Progress Notes (Signed)
Occupational Therapy Session Note  Patient Details  Name: Dillan Candela MRN: [id number] Date of Birth: 1966-07-14  Today's Date: 02/11/2020 OT Individual Time: 3785-8850 OT Individual Time Calculation (min): 31 min   Short Term Goals: Week 2:  OT Short Term Goal 1 (Week 2): Pt will complete sit<>stand with no more than CGA within BADL tasks OT Short Term Goal 2 (Week 2): Pt will complete LB dressing with no more than min A OT Short Term Goal 3 (Week 2): Pt will don [facility] boots with no more than min A  Skilled Therapeutic Interventions/Progress Updates:    Pt greeted in the recliner, asleep, easily woken. He then asked OT to help him open up his container of cake. Min A for this. Pt with no c/o pain and he declined toileting. Satting at 99% on 2L. When he was finished eating, pt ambulated with RW to the sink with CGA, assist for mgt his 02 tubing. He completed oral care while standing and then returned his recliner to apply lotion to upper arms and legs and also change pants. Pt required Max A to doff Darco shoes and increased assist to don pants due to time constraints. CGA for standing balance while pt elevated pants over hips and tied them. When pt returned to sitting 02 sats were 98-99% still. Left pt sitting in the recliner with all needs within reach, in care of RN.   Therapy Documentation Precautions:  Precautions Precautions: Fall Precaution Comments: Peg, Darco shoes Restrictions Weight Bearing Restrictions: No Vital Signs: Therapy Vitals Pulse Rate: 97 Resp: 20 BP: (!) 86/66 Patient Position (if appropriate): Sitting Oxygen Therapy SpO2: 100 % O2 Device: Nasal Cannula Pain: Pt reported pain in feet last night but since then pain has resolved   ADL:      Therapy/Group: Individual Therapy  Adaia Matthies A Devell Parkerson 02/11/2020, 4:39 PM

## 2020-02-12 ENCOUNTER — Inpatient Hospital Stay (HOSPITAL_COMMUNITY): Payer: Medicare Other | Admitting: Physical Therapy

## 2020-02-12 ENCOUNTER — Inpatient Hospital Stay (HOSPITAL_COMMUNITY): Payer: Medicare Other | Admitting: Occupational Therapy

## 2020-02-12 ENCOUNTER — Inpatient Hospital Stay (HOSPITAL_COMMUNITY): Payer: Medicare Other

## 2020-02-12 MED ORDER — LEVOTHYROXINE SODIUM 25 MCG PO TABS
25.0000 ug | ORAL_TABLET | Freq: Every day | ORAL | Status: DC
  Administered 2020-02-13 – 2020-02-27 (×15): 25 ug via ORAL
  Filled 2020-02-12 (×15): qty 1

## 2020-02-12 MED ORDER — ACETAMINOPHEN 325 MG PO TABS
325.0000 mg | ORAL_TABLET | ORAL | Status: DC | PRN
  Administered 2020-02-13 – 2020-02-27 (×19): 650 mg via ORAL
  Filled 2020-02-12 (×21): qty 2

## 2020-02-12 MED ORDER — BENZTROPINE MESYLATE 1 MG PO TABS
1.0000 mg | ORAL_TABLET | Freq: Two times a day (BID) | ORAL | Status: DC
  Administered 2020-02-12 – 2020-02-27 (×30): 1 mg via ORAL
  Filled 2020-02-12 (×32): qty 1

## 2020-02-12 MED ORDER — FOLIC ACID 1 MG PO TABS
1.0000 mg | ORAL_TABLET | Freq: Every day | ORAL | Status: DC
  Administered 2020-02-13 – 2020-02-27 (×15): 1 mg via ORAL
  Filled 2020-02-12 (×15): qty 1

## 2020-02-12 MED ORDER — MIDODRINE HCL 5 MG PO TABS
10.0000 mg | ORAL_TABLET | Freq: Three times a day (TID) | ORAL | Status: DC
  Administered 2020-02-12 – 2020-02-27 (×44): 10 mg via ORAL
  Filled 2020-02-12 (×45): qty 2

## 2020-02-12 MED ORDER — PRO-STAT SUGAR FREE PO LIQD
30.0000 mL | Freq: Two times a day (BID) | ORAL | Status: DC
  Administered 2020-02-12 – 2020-02-27 (×30): 30 mL via ORAL
  Filled 2020-02-12 (×30): qty 30

## 2020-02-12 MED ORDER — APIXABAN 2.5 MG PO TABS
2.5000 mg | ORAL_TABLET | Freq: Two times a day (BID) | ORAL | Status: DC
  Administered 2020-02-12 – 2020-02-27 (×30): 2.5 mg via ORAL
  Filled 2020-02-12 (×30): qty 1

## 2020-02-12 MED ORDER — MELATONIN 3 MG PO TABS
3.0000 mg | ORAL_TABLET | Freq: Every day | ORAL | Status: DC
  Administered 2020-02-12 – 2020-02-26 (×14): 3 mg via ORAL
  Filled 2020-02-12 (×16): qty 1

## 2020-02-12 MED ORDER — METOPROLOL TARTRATE 25 MG PO TABS
25.0000 mg | ORAL_TABLET | Freq: Three times a day (TID) | ORAL | Status: DC
  Administered 2020-02-12 – 2020-02-24 (×10): 25 mg via ORAL
  Filled 2020-02-12 (×19): qty 1

## 2020-02-12 MED ORDER — CLONAZEPAM 0.5 MG PO TABS
1.0000 mg | ORAL_TABLET | Freq: Three times a day (TID) | ORAL | Status: DC
  Administered 2020-02-12 – 2020-02-19 (×21): 1 mg via ORAL
  Filled 2020-02-12 (×23): qty 2

## 2020-02-12 MED ORDER — THIAMINE HCL 100 MG PO TABS
100.0000 mg | ORAL_TABLET | Freq: Every day | ORAL | Status: DC
  Administered 2020-02-13 – 2020-02-27 (×15): 100 mg via ORAL
  Filled 2020-02-12 (×15): qty 1

## 2020-02-12 MED ORDER — GUAIFENESIN-DM 100-10 MG/5ML PO SYRP
5.0000 mL | ORAL_SOLUTION | Freq: Four times a day (QID) | ORAL | Status: DC | PRN
  Administered 2020-02-18 (×3): 10 mL via ORAL
  Filled 2020-02-12 (×3): qty 10

## 2020-02-12 MED ORDER — GABAPENTIN 250 MG/5ML PO SOLN
100.0000 mg | Freq: Three times a day (TID) | ORAL | Status: DC
  Administered 2020-02-12 – 2020-02-23 (×31): 100 mg via ORAL
  Filled 2020-02-12 (×34): qty 2

## 2020-02-12 MED ORDER — RENA-VITE PO TABS
1.0000 | ORAL_TABLET | Freq: Every day | ORAL | Status: DC
  Administered 2020-02-12 – 2020-02-26 (×15): 1 via ORAL
  Filled 2020-02-12 (×15): qty 1

## 2020-02-12 MED ORDER — AMIODARONE HCL 200 MG PO TABS
200.0000 mg | ORAL_TABLET | Freq: Every day | ORAL | Status: DC
  Administered 2020-02-13 – 2020-02-27 (×15): 200 mg via ORAL
  Filled 2020-02-12 (×15): qty 1

## 2020-02-12 MED ORDER — PROCHLORPERAZINE MALEATE 5 MG PO TABS: 5.0000 mg | ORAL_TABLET | Freq: Four times a day (QID) | ORAL | Status: DC | PRN

## 2020-02-12 MED ORDER — TRAMADOL HCL 50 MG PO TABS
50.0000 mg | ORAL_TABLET | Freq: Four times a day (QID) | ORAL | Status: DC | PRN
  Administered 2020-02-13 – 2020-02-27 (×32): 50 mg via ORAL
  Filled 2020-02-12 (×33): qty 1

## 2020-02-12 MED ORDER — VITAMIN C 500 MG/5ML PO SYRP
500.0000 mg | ORAL_SOLUTION | Freq: Two times a day (BID) | ORAL | Status: DC
  Administered 2020-02-12 – 2020-02-27 (×30): 500 mg via ORAL
  Filled 2020-02-12 (×32): qty 5

## 2020-02-12 MED ORDER — OXYCODONE HCL 5 MG PO TABS
2.5000 mg | ORAL_TABLET | Freq: Four times a day (QID) | ORAL | Status: DC | PRN
  Administered 2020-02-12 – 2020-02-19 (×14): 2.5 mg via ORAL
  Filled 2020-02-12 (×14): qty 1

## 2020-02-12 MED ORDER — PREDNISONE 10 MG PO TABS
10.0000 mg | ORAL_TABLET | Freq: Every day | ORAL | Status: DC
  Administered 2020-02-13 – 2020-02-27 (×15): 10 mg via ORAL
  Filled 2020-02-12 (×15): qty 1

## 2020-02-12 MED ORDER — COLCHICINE 0.6 MG PO TABS
0.6000 mg | ORAL_TABLET | Freq: Every day | ORAL | Status: DC
  Administered 2020-02-13 – 2020-02-27 (×15): 0.6 mg via ORAL
  Filled 2020-02-12 (×15): qty 1

## 2020-02-12 MED ORDER — HEPARIN SODIUM (PORCINE) 1000 UNIT/ML DIALYSIS: 20.0000 [IU]/kg | INTRAMUSCULAR | Status: DC | PRN

## 2020-02-12 NOTE — Progress Notes (Signed)
Occupational Therapy Session Note  Patient Details  Name: [name] [name] MRN: [id number] Date of Birth: Jan 25, 1966  Today's Date: 02/12/2020 OT Individual Time: 0905-1000 OT Individual Time Calculation (min): 55 min    Short Term Goals: Week 1:  OT Short Term Goal 1 (Week 1): STG=LTG  Skilled Therapeutic Interventions/Progress Updates:    patient seated in recliner, alert and pleasant.  O2 2L via [place], saturation 96%.  (checked t/o session remains above 90% after standing and mobility tasks)   Sit to stand CS.  Ambulation in room and to/from therapy gym with CGA.  Completed seated and standing dynamic balance and conditioning activities with brief rest break between.  Completed dynavision in stance - 2 minutes, whole screen:  Trial 1 = 1.52 sec, trial 2 = 1.38 sec. Reviewed breathing and energy conservation techniques.  Returned to recliner at close of session, call bell and tray table in reach.    Therapy Documentation Precautions:  Precautions Precautions: Fall Precaution Comments: Peg, Darco shoes Restrictions Weight Bearing Restrictions: No General:   Vital Signs: Therapy Vitals Temp: 98.5 F (36.9 C) Pulse Rate: 96 Resp: 18 BP: 109/81 Patient Position (if appropriate): Sitting Oxygen Therapy SpO2: 98 %  Therapy/Group: Individual Therapy  Carlos Levering 02/12/2020, 7:40 AM

## 2020-02-12 NOTE — Plan of Care (Signed)
  Problem: Consults Goal: RH GENERAL PATIENT EDUCATION Description: See Patient Education module for education specifics. Outcome: Progressing   Problem: RH BOWEL ELIMINATION Goal: RH STG MANAGE BOWEL WITH ASSISTANCE Description: STG Manage Bowel with Assistance. mod Outcome: Progressing   Problem: RH SKIN INTEGRITY Goal: RH STG SKIN FREE OF INFECTION/BREAKDOWN Description: Free of breakdown and infection with mod assist Outcome: Progressing Goal: RH STG ABLE TO PERFORM INCISION/WOUND CARE W/ASSISTANCE Description: STG Able To Perform Incision/Wound Care With Assistance. Mod Outcome: Progressing   Problem: RH SAFETY Goal: RH STG ADHERE TO SAFETY PRECAUTIONS W/ASSISTANCE/DEVICE Description: STG Adhere to Safety Precautions With Assistance/Device. Mod Outcome: Progressing   Problem: RH PAIN MANAGEMENT Goal: RH STG PAIN MANAGED AT OR BELOW PT'S PAIN GOAL Description: Less than 4 Outcome: Progressing

## 2020-02-12 NOTE — Progress Notes (Signed)
[place] PHYSICAL MEDICINE & REHABILITATION PROGRESS NOTE  Subjective/Complaints: Seems to have had a little better night. Pain in feet still keep him up.   ROS: Patient denies fever, rash, sore throat, blurred vision, nausea, vomiting, diarrhea, cough, shortness of breath or chest pain, joint or back pain, headache, or mood change.   Objective: Vital Signs: Blood pressure 103/89, pulse (!) 103, temperature 98.5 F (36.9 C), resp. rate 18, height 5' 10.5" (1.791 m), weight 62.9 kg, SpO2 98 %. DG CHEST PORT 1 VIEW  Result Date: 02/12/2020 CLINICAL DATA:  Cough and shortness of breath. EXAM: PORTABLE CHEST 1 VIEW COMPARISON:  02/08/2020 and prior studies FINDINGS: Cardiomegaly, bilateral interstitial/airspace opacities and RIGHT LOWER lung consolidation/atelectasis again noted. Bilateral pleural effusions are unchanged. There is no evidence of pneumothorax. IMPRESSION: Unchanged chest radiograph with bilateral interstitial/airspace opacities, RIGHT LOWER lung consolidation/atelectasis and pleural effusions. Electronically Signed   By: Margarette Canada M.D.   On: 02/12/2020 09:34   Recent Labs    02/10/20 1149 02/10/20 1836  WBC 6.9 5.4  HGB 12.3* 13.1  HCT 41.1 43.8  PLT 167 154   Recent Labs    02/10/20 1149 02/10/20 1930  NA 136 138  K 5.5* 4.3  CL 95* 94*  CO2 24 26  GLUCOSE 116* 130*  BUN 80* 26*  CREATININE 5.59* 3.09*  CALCIUM 8.9 8.8*    Physical Exam: BP 103/89   Pulse (!) 103   Temp 98.5 F (36.9 C)   Resp 18   Ht 5' 10.5" (1.791 m)   Wt 62.9 kg   SpO2 98%   BMI 19.62 kg/m  Constitutional: No distress . Vital signs reviewed. HEENT: EOMI, oral membranes moist Neck: supple Cardiovascular: RRR without murmur. No JVD    Respiratory/Chest: CTA Bilaterally without wheezes or rales. Normal effort    GI/Abdomen: BS +, non-tender, non-distended Ext: both feet cool to touch Skin:   Bilateral toes gangrenous, right more involved than left, esp great toe Psych:  flat but somewhat engaging Musc: No edema in extremities.  No tenderness in extremities. Neurological: alert, dysarthric speech. Processing better. Better awareness Motor: 3-4 -/5 moves all 4's.    Assessment/Plan: 1. Functional deficits secondary to encephalopathy which require 3+ hours per day of interdisciplinary therapy in a comprehensive inpatient rehab setting.  Physiatrist is providing close team supervision and 24 hour management of active medical problems listed below.  Physiatrist and rehab team continue to assess barriers to discharge/monitor patient progress toward functional and medical goals  Care Tool:  Bathing  Bathing activity did not occur: Refused Body parts bathed by patient: Right arm, Left arm, Chest, Abdomen, Front perineal area, Right lower leg, Left lower leg, Buttocks, Right upper leg, Left upper leg   Body parts bathed by helper: Abdomen, Right lower leg, Left lower leg     Bathing assist Assist Level: Total Assistance - Patient < 25%     Upper Body Dressing/Undressing Upper body dressing Upper body dressing/undressing activity did not occur (including orthotics): N/A What is the patient wearing?: Pull over shirt    Upper body assist Assist Level: Minimal Assistance - Patient > 75%    Lower Body Dressing/Undressing Lower body dressing    Lower body dressing activity did not occur: N/A What is the patient wearing?: Underwear/pull up, Pants(sweat pants)     Lower body assist Assist for lower body dressing: Total Assistance - Patient < 25%     Toileting Toileting    Toileting assist Assist for toileting: Moderate  Assistance - Patient 50 - 74%     Transfers Chair/bed transfer  Transfers assist     Chair/bed transfer assist level: Contact Guard/Touching assist     Locomotion Ambulation   Ambulation assist   Ambulation activity did not occur: N/A  Assist level: Minimal Assistance - Patient > 75% Assistive device: Walker-rolling Max  distance: 35'   Walk 10 feet activity   Assist     Assist level: Minimal Assistance - Patient > 75% Assistive device: Walker-rolling   Walk 50 feet activity   Assist    Assist level: Minimal Assistance - Patient > 75% Assistive device: Walker-rolling    Walk 150 feet activity   Assist Walk 150 feet activity did not occur: Safety/medical concerns         Walk 10 feet on uneven surface  activity   Assist Walk 10 feet on uneven surfaces activity did not occur: Safety/medical concerns         Wheelchair     Assist Will patient use wheelchair at discharge?: Yes Type of Wheelchair: Manual Wheelchair activity did not occur: N/A  Wheelchair assist level: Minimal Assistance - Patient > 75% Max wheelchair distance: 75    Wheelchair 50 feet with 2 turns activity    Assist        Assist Level: Minimal Assistance - Patient > 75%   Wheelchair 150 feet activity     Assist     Assist Level: Moderate Assistance - Patient 50 - 74%      Medical Problem List and Plan: 1.  Deficits with mobility, endurance, self-care secondary to encephalopathy with debility, PVD with gangrenous toes.  -Continue CIR therapies including PT, OT, and SLP   -pt with baseline cognitive deficits 2.  Antithrombotics: -PE/anticoagulation:  Pharmaceutical: Other (comment)--on low dose Eliquis             -antiplatelet therapy:  N/A 3. Pain Management: Oxycodone or tramadol prn.   2/24: Therapist notes that Mr. Latona has been having hypersensitivity of his gangrenous toes. Will add low dose Gabapentin 100mg  TID to help with pain-- low dose due to ESRD.  2/28 continue gabapentin---seems to be tolerating so far 4. Mood: LCSW to follow for evaluation and support.    See #14             -antipsychotic agents: N/A   See #14 5. Neuropsych: This patient is not fully capable of making decisions on his own behalf. 6. Skin/Wound Care: Dry dressing to bilateral feet.               stoma closing. PEG site intact 7. Fluids/Electrolytes/Nutrition:   2/23 stopped TF  -monitor intake.   8. A fib/NSVT: On amiodarone and metoprolol for rate control.              Rate in 90's on 2/25   -isolated bradycardia yesterday   Monitor with increased activity.  -intermittent SOB, CXR demonstrates CHF. Nephrology working on volume mgt with HD 9.  ESRD: HD MWF at the end of the day to help with activity tolerance.   -volume/weight mgt per nephrology, given cxr findings Filed Weights   02/10/20 1151 02/10/20 1545 02/11/20 0500  Weight: 64.4 kg 61.9 kg 62.9 kg  10. Pericardial effusion: Has been on colchicine since 1/19 and has been weaned to prednisone 10 mg daily. Last echo 1/29 shows decrease to small effusion. 11. Hypotension: Continue midodrine 10 mg tid.  BP's still soft 2/26 12. Dysphagia:   Eating fairly  well now  2/26- had MBS today---upgrade to regular/thins 13. Dry gangrene bilateral feet: Needs to wear shoes for protection. Dry dressing. Maintain adequate nutritional status.    -he is having ongoing pain in feet, right more than left.   -both feet cool, especially right  -wounds with some improvement but demonstrate ongoing gangrene as well  -vascular PA has seen patient. Waiting f/u by attending MD  -continue local care, elevation, pain mgt for now 14. Anxiety/depression: On Zoloft 25 mg/day.  Team support.  ECG with prolonged QTC 512  Continue melatonin  Klonopin increased to 1 mg 3 times daily ---dc bedtime dose-likely lethargic partly d/t this---avoid excessive use 15. Insomnia: Per father, he sleeps late at night at baseline.   -2/27 still having difficulties more often than not at night d/t confusion/agitation/anxiety   -resumed seroquel 25mg  qhs   -QTC actually was DECREASED last week after giving seroquel the other day.    -recheck QTC on Tuesday  LOS: 9 days A FACE TO FACE EVALUATION WAS PERFORMED  Meredith Staggers 02/12/2020, 9:45 AM

## 2020-02-12 NOTE — Progress Notes (Signed)
Physical Therapy Session Note  Patient Details  Name: [name] [name] MRN: [id number] Date of Birth: 03/10/1966  Today's Date: 02/12/2020 PT Individual Time: 1420-1523 PT Individual Time Calculation (min): 63 min   Short Term Goals: Week 2:  PT Short Term Goal 1 (Week 2): Pt will negotiate 4 steps with B rails with min assist. PT Short Term Goal 2 (Week 2): Pt will complete car transfer with LRAD & min assist. PT Short Term Goal 3 (Week 2): Pt will ambulate 100 ft with LRAD & min assist.  Skilled Therapeutic Interventions/Progress Updates:  Pt presented in bed sleeping and easily aroused. Pt agreeable to therapy. PTA donned Darco shoes total A for time management. Performed stand pivot transfer to w/c requiring minA for STS from EOB and CGA for transfer. Pt transported to rehab gym for energy conservation. Performed stand pivot to high/low mat CGA overall. Pt participated in several bouts of horseshoes for standing tolerance and balance. Pt performed with RW and demonstrated overall fair balance with moderate reaching challenges with RW and no LOB. Pt was also able to perform a bout with LLE on 2in step reaching with contralateral UE. Pt ambulated approx 80f to stairs and performed x 1 step with bilateral rails and minA however significant fatigue after step requiring seated rest. Pt verbalized some frustration and poor endurance with PTA providing encouragement and emotional support. Pt ambulated approx 863fwith CGA from nsg station to room and pt sat in recliner. Pt left with belt alarm on, call bell within reach and current needs met with NT present to take vitals.      Therapy Documentation Precautions:  Precautions Precautions: Fall Precaution Comments: Peg, Darco shoes Restrictions Weight Bearing Restrictions: No General:   Vital Signs: Therapy Vitals Pulse Rate: 84 Resp: 19 BP: (!) 88/75 Patient Position (if appropriate): Sitting Oxygen Therapy SpO2: 97 % O2 Device: Nasal  Cannula Pain: Pain Assessment Pain Score: Asleep    Therapy/Group: Individual Therapy  Geovanna Simko  Rudell Marlowe, PTA  02/12/2020, 3:36 PM

## 2020-02-12 NOTE — Progress Notes (Signed)
[name] [name] Progress Note   Subjective:  Seen in room - no overnight c/o. Short-term memory still poor. No CP/dyspnea.  Objective Vitals:   02/11/20 1440 02/11/20 2011 02/12/20 0358 02/12/20 0756  BP: (!) 86/66 101/86 109/81 103/89  Pulse: 97 95 96 (!) 103  Resp: 20 17 18    Temp:  98.6 F (37 C) 98.5 F (36.9 C)   TempSrc:      SpO2: 100% 99% 98%   Weight:      Height:       Physical Exam General: Well appearing, NAD  Heart: RRR; no murmur Lungs: Coarse air movement in B bases without overt rales Abdomen: soft, non-tender. PEG in place. Extremities: No LE edema above boots - but per VVS note, there is pedal edema. Dialysis Access: L AVF + thrill  Additional Objective Labs: Basic Metabolic Panel: Recent Labs  Lab 02/08/20 1240 02/10/20 1149 02/10/20 1930  NA 135 136 138  K 4.8 5.5* 4.3  CL 89* 95* 94*  CO2 26 24 26   GLUCOSE 108* 116* 130*  BUN 100* 80* 26*  CREATININE 6.25* 5.59* 3.09*  CALCIUM 9.4 8.9 8.8*  PHOS 3.6 3.0 2.1*   Liver Function Tests: Recent Labs  Lab 02/05/20 1748 02/06/20 1322 02/08/20 1240 02/10/20 1149 02/10/20 1930  AST 15  --   --   --   --   ALT 19  --   --   --   --   ALKPHOS 120  --   --   --   --   BILITOT 0.7  --   --   --   --   PROT 7.3  --   --   --   --   ALBUMIN 3.7   < > 3.7 3.5 3.9   < > = values in this interval not displayed.   CBC: Recent Labs  Lab 02/05/20 1748 02/05/20 1748 02/06/20 1322 02/06/20 1322 02/08/20 1254 02/10/20 1149 02/10/20 1836  WBC 12.1*   < > 10.6*   < > 10.2 6.9 5.4  HGB 13.2   < > 13.1   < > 12.3* 12.3* 13.1  HCT 42.7   < > 43.1   < > 40.4 41.1 43.8  MCV 89.3  --  89.0  --  89.4 91.3 92.0  PLT 120*   < > 151   < > 143* 167 154   < > = values in this interval not displayed.   Studies/Results: DG CHEST PORT 1 VIEW  Result Date: 02/12/2020 CLINICAL DATA:  Cough and shortness of breath. EXAM: PORTABLE CHEST 1 VIEW COMPARISON:  02/08/2020 and prior studies FINDINGS:  Cardiomegaly, bilateral interstitial/airspace opacities and RIGHT LOWER lung consolidation/atelectasis again noted. Bilateral pleural effusions are unchanged. There is no evidence of pneumothorax. IMPRESSION: Unchanged chest radiograph with bilateral interstitial/airspace opacities, RIGHT LOWER lung consolidation/atelectasis and pleural effusions. Electronically Signed   By: Margarette Canada M.D.   On: 02/12/2020 09:34   Medications:  . [START ON 02/13/2020] amiodarone  200 mg Oral Daily  . apixaban  2.5 mg Oral BID  . ascorbic acid  500 mg Oral BID  . benztropine  1 mg Oral BID  . clonazePAM  1 mg Oral TID WC  . [START ON 02/13/2020] colchicine  0.6 mg Oral Daily  . feeding supplement (NEPRO CARB STEADY)  237 mL Oral BID BM  . feeding supplement (PRO-STAT SUGAR FREE 64)  30 mL Oral BID  . [START ON 02/16/3298] folic acid  1 mg Oral Daily  . free water  100 mL Per Tube BID  . gabapentin  100 mg Oral Q8H  . [START ON 02/13/2020] levothyroxine  25 mcg Oral Q0600  . mouth rinse  15 mL Mouth Rinse BID  . Melatonin  3 mg Oral QHS  . metoprolol tartrate  25 mg Oral TID WC  . midodrine  10 mg Oral TID WC  . multivitamin  1 tablet Oral QHS  . [START ON 02/13/2020] predniSONE  10 mg Oral Q breakfast  . QUEtiapine  25 mg Oral QHS  . [START ON 02/13/2020] thiamine  100 mg Oral Daily    Dialysis Orders: Prev orders - will need to change significantly on d/c MWF at [facility], 4:15hr, EDW 74.5kg, 2K/2.5Ca, AVF, heparin 5K - Calcitriol 0.41mcg PO q HD. No ESA or venofer.  Assessment/Plan: 1.Deconditioning: S/p prolonged [facility] admit (12/2-12/23/19) s/p cardiac arrest, then [facility] until 2/21. Now CIR. 2. ESRD:Continue HD per MWF schedule-next HD 3/1. 3. HTN/volume:BP ok, currently on metoprolol + midodrine.CXR 2/24 and 2/28 with persistent pulm edema. Will maximize UP goal tomorrow. 4. Anemia:Hgb13.1- no ESA needed. 5. Secondary hyperparathyroidism:Ca ok, Phos low side. No binders/VDRA. 6.  Nutrition/dysphagia: TFs + nectars 7. Hx cardiac arrest  8. Hx PE: On Eliquis 9. A-fib: On Eliquis + amiodarone 10. Hx pericardial effusion ->colchicine +prednisone 10mg  QD. 11. Anxiety + lethargy: Meds per primary. 12. HFrEF (<20%) 13. Hx resp failure with vent/trach: Trach decannulated/healed  14. Toe gangrene: S/p prolonged pressor use/ischemic changes to BLE - slightly improved. VVS following  Veneta Penton, PA-C 02/12/2020, 9:47 AM  [employer]

## 2020-02-13 ENCOUNTER — Inpatient Hospital Stay (HOSPITAL_COMMUNITY): Payer: Medicare Other | Admitting: Physical Therapy

## 2020-02-13 ENCOUNTER — Inpatient Hospital Stay (HOSPITAL_COMMUNITY): Payer: Medicare Other | Admitting: Speech Pathology

## 2020-02-13 ENCOUNTER — Inpatient Hospital Stay (HOSPITAL_COMMUNITY): Payer: Medicare Other | Admitting: Occupational Therapy

## 2020-02-13 DIAGNOSIS — I120 Hypertensive chronic kidney disease with stage 5 chronic kidney disease or end stage renal disease: Secondary | ICD-10-CM | POA: Diagnosis not present

## 2020-02-13 DIAGNOSIS — N186 End stage renal disease: Secondary | ICD-10-CM | POA: Diagnosis not present

## 2020-02-13 DIAGNOSIS — Z992 Dependence on renal dialysis: Secondary | ICD-10-CM | POA: Diagnosis not present

## 2020-02-13 LAB — CBC
HCT: 40.8 % (ref 39.0–52.0)
Hemoglobin: 12.3 g/dL — ABNORMAL LOW (ref 13.0–17.0)
MCH: 27.2 pg (ref 26.0–34.0)
MCHC: 30.1 g/dL (ref 30.0–36.0)
MCV: 90.3 fL (ref 80.0–100.0)
Platelets: 196 10*3/uL (ref 150–400)
RBC: 4.52 MIL/uL (ref 4.22–5.81)
RDW: 21 % — ABNORMAL HIGH (ref 11.5–15.5)
WBC: 6.8 10*3/uL (ref 4.0–10.5)
nRBC: 0 % (ref 0.0–0.2)

## 2020-02-13 LAB — RENAL FUNCTION PANEL
Albumin: 3.6 g/dL (ref 3.5–5.0)
Anion gap: 18 — ABNORMAL HIGH (ref 5–15)
BUN: 124 mg/dL — ABNORMAL HIGH (ref 6–20)
CO2: 24 mmol/L (ref 22–32)
Calcium: 8.6 mg/dL — ABNORMAL LOW (ref 8.9–10.3)
Chloride: 91 mmol/L — ABNORMAL LOW (ref 98–111)
Creatinine, Ser: 7.06 mg/dL — ABNORMAL HIGH (ref 0.61–1.24)
GFR calc Af Amer: 9 mL/min — ABNORMAL LOW (ref >60)
GFR calc non Af Amer: 8 mL/min — ABNORMAL LOW (ref >60)
Glucose, Bld: 116 mg/dL — ABNORMAL HIGH (ref 70–99)
Phosphorus: 2.7 mg/dL (ref 2.5–4.6)
Potassium: 4.8 mmol/L (ref 3.5–5.1)
Sodium: 133 mmol/L — ABNORMAL LOW (ref 135–145)

## 2020-02-13 MED ORDER — TRAMADOL HCL 50 MG PO TABS
ORAL_TABLET | ORAL | Status: AC
  Administered 2020-02-13: 50 mg via ORAL
  Filled 2020-02-13: qty 1

## 2020-02-13 NOTE — Progress Notes (Signed)
Occupational Therapy Session Note  Patient Details  Name: [name] [name] MRN: [id number] Date of Birth: 1966/11/28  Today's Date: 02/13/2020 OT Individual Time: 0034-9179 OT Individual Time Calculation (min): 23 min   Short Term Goals: Week 2:  OT Short Term Goal 1 (Week 2): Pt will complete sit<>stand with no more than CGA within BADL tasks OT Short Term Goal 2 (Week 2): Pt will complete LB dressing with no more than min A OT Short Term Goal 3 (Week 2): Pt will don [facility] boots with no more than min A  Skilled Therapeutic Interventions/Progress Updates:    Pt greeted sitting in recliner and agreeable to OT treatment session. Pt on 2L of O2 throughout session. Worked on pt donning his own Darco shoes with increased time and figure 4 position. Min verbal cues for location of Velcro strapping. Pt then completed sit<>stand from recliner with CGA.Pt ambulated to the sink with RW and CGA. Pt needed min/CGA for balance at the sink with verbal cues for equal stance position as pt was standing with split stance and looisng balance. Pt tolerated standing for 6 mins, then ambulated to the bathroom to place soiled wash cloth in dirty clothes bin. Pt then ambulated another  5 feet in hallway before running out of O2 cord, so pt returned to recliner to rest. Nursing then entered for dressing change to B feet. Pt missed 7 minutes of OT session 2/2 nursing care.   Therapy Documentation Precautions:  Precautions Precautions: Fall Precaution Comments: Peg, Darco shoes Restrictions Weight Bearing Restrictions: No General: General OT Amount of Missed Time: 7 Minutes(dressing change) Vital Signs: Therapy Vitals Pulse Rate: 98 BP: 98/84 Pain: Pain Assessment Pain Scale: 0-10 Pain Score: Asleep  Therapy/Group: Individual Therapy  Valma Cava 02/13/2020, 8:01 AM

## 2020-02-13 NOTE — Progress Notes (Signed)
Physical Therapy Session Note  Patient Details  Name: [name] [name] MRN: [id number] Date of Birth: 11-19-1966  Today's Date: 02/13/2020 PT Individual Time: 1008-1101 PT Individual Time Calculation (min): 53 min   Short Term Goals: Week 2:  PT Short Term Goal 1 (Week 2): Pt will negotiate 4 steps with B rails with min assist. PT Short Term Goal 2 (Week 2): Pt will complete car transfer with LRAD & min assist. PT Short Term Goal 3 (Week 2): Pt will ambulate 100 ft with LRAD & min assist.  Skilled Therapeutic Interventions/Progress Updates:  Pt received in recliner & agreeable to tx. Pt on 2L/min supplemental oxygen via nasal canula & SpO2 >90% throughout session.   Sit<>stand and stand pivot transfers with RW & CGA. Gait x 100 ft + 100 ft with RW & CGA with cuing for forward vs downward gaze & to relax B shoulders as they're both elevated with fair demo. Pt with c/o knee buckling x 1 time but not observed by this PT.   Discussed changing schedule from 15/7 back to regular schedule & pt agreeable. Discussed need for pt to safely negotiate stairs prior to d/c home with pt reporting his father installed ramps -- PT to f/u with pt's father to confirm this.   Standing balance tasks including standing with normal & narrow BOS without BUE support, narrow BOS without BUE support & eyes closed, & staggered stance without BUE support & eyes closed with min assist for balance.   Pt left in recliner with chair alarm donned, call bell & all needs in reach.   Therapy Documentation Precautions:  Precautions Precautions: Fall Precaution Comments: Peg, Darco shoes Restrictions Weight Bearing Restrictions: No  Pain: No formal c/o pain during session.    Therapy/Group: Individual Therapy  [name] 02/13/2020, 12:34 PM

## 2020-02-13 NOTE — Progress Notes (Signed)
PHYSICAL MEDICINE & REHABILITATION PROGRESS NOTE  Subjective/Complaints: Denies SOB. Has some pain in feet but is getting relief from Oxycodone.  Slept well last night.  Appreciate vascular surgery follow-up this morning.   ROS: Patient denies fever, rash, sore throat, blurred vision, nausea, vomiting, diarrhea, cough, shortness of breath or chest pain, joint or back pain, headache, or mood change.   Objective: Vital Signs: Blood pressure 98/84, pulse 98, temperature 98.5 F (36.9 C), resp. rate 19, height 5' 10.5" (1.791 m), weight 66.2 kg, SpO2 96 %. DG CHEST PORT 1 VIEW  Result Date: 02/12/2020 CLINICAL DATA:  Cough and shortness of breath. EXAM: PORTABLE CHEST 1 VIEW COMPARISON:  02/08/2020 and prior studies FINDINGS: Cardiomegaly, bilateral interstitial/airspace opacities and RIGHT LOWER lung consolidation/atelectasis again noted. Bilateral pleural effusions are unchanged. There is no evidence of pneumothorax. IMPRESSION: Unchanged chest radiograph with bilateral interstitial/airspace opacities, RIGHT LOWER lung consolidation/atelectasis and pleural effusions. Electronically Signed   By: Margarette Canada M.D.   On: 02/12/2020 09:34   Recent Labs    02/10/20 1149 02/10/20 1836  WBC 6.9 5.4  HGB 12.3* 13.1  HCT 41.1 43.8  PLT 167 154   Recent Labs    02/10/20 1149 02/10/20 1930  NA 136 138  K 5.5* 4.3  CL 95* 94*  CO2 24 26  GLUCOSE 116* 130*  BUN 80* 26*  CREATININE 5.59* 3.09*  CALCIUM 8.9 8.8*    Physical Exam: BP 98/84   Pulse 98   Temp 98.5 F (36.9 C)   Resp 19   Ht 5' 10.5" (1.791 m)   Wt 66.2 kg   SpO2 96%   BMI 20.64 kg/m  Constitutional: No distress . Vital signs reviewed. Sitting up in chair, appears calm.  HEENT: EOMI, oral membranes moist Neck: supple Cardiovascular: RRR without murmur. No JVD    Respiratory/Chest: CTA Bilaterally without wheezes or rales. Normal effort    GI/Abdomen: BS +, non-tender, non-distended Ext: both feet  cool to touch Skin:   Bilateral toes gangrenous, right more involved than left, esp great toe Psych: flat but somewhat engaging Musc: No edema in extremities.  No tenderness in extremities. Neurological: alert, dysarthric speech. Processing better. Better awareness Motor: 3-4 -/5 moves all 4's.    Assessment/Plan: 1. Functional deficits secondary to encephalopathy which require 3+ hours per day of interdisciplinary therapy in a comprehensive inpatient rehab setting.  Physiatrist is providing close team supervision and 24 hour management of active medical problems listed below.  Physiatrist and rehab team continue to assess barriers to discharge/monitor patient progress toward functional and medical goals  Care Tool:  Bathing  Bathing activity did not occur: Refused Body parts bathed by patient: Buttocks, Front perineal area   Body parts bathed by helper: Abdomen, Right lower leg, Left lower leg     Bathing assist Assist Level: Supervision/Verbal cueing     Upper Body Dressing/Undressing Upper body dressing Upper body dressing/undressing activity did not occur (including orthotics): N/A What is the patient wearing?: Pull over shirt    Upper body assist Assist Level: Moderate Assistance - Patient 50 - 74%    Lower Body Dressing/Undressing Lower body dressing    Lower body dressing activity did not occur: N/A What is the patient wearing?: Underwear/pull up, Pants     Lower body assist Assist for lower body dressing: Moderate Assistance - Patient 50 - 74%     Toileting Toileting    Toileting assist Assist for toileting: Minimal Assistance - Patient > 75%  Transfers Chair/bed transfer  Transfers assist     Chair/bed transfer assist level: Minimal Assistance - Patient > 75%     Locomotion Ambulation   Ambulation assist   Ambulation activity did not occur: N/A  Assist level: Contact Guard/Touching assist Assistive device: Walker-rolling Max distance:  63ft   Walk 10 feet activity   Assist     Assist level: Contact Guard/Touching assist Assistive device: Walker-rolling   Walk 50 feet activity   Assist    Assist level: Contact Guard/Touching assist Assistive device: Walker-rolling    Walk 150 feet activity   Assist Walk 150 feet activity did not occur: Safety/medical concerns         Walk 10 feet on uneven surface  activity   Assist Walk 10 feet on uneven surfaces activity did not occur: Safety/medical concerns         Wheelchair     Assist Will patient use wheelchair at discharge?: Yes Type of Wheelchair: Manual Wheelchair activity did not occur: N/A  Wheelchair assist level: Minimal Assistance - Patient > 75% Max wheelchair distance: 75    Wheelchair 50 feet with 2 turns activity    Assist        Assist Level: Minimal Assistance - Patient > 75%   Wheelchair 150 feet activity     Assist     Assist Level: Moderate Assistance - Patient 50 - 74%      Medical Problem List and Plan: 1.  Deficits with mobility, endurance, self-care secondary to encephalopathy with debility, PVD with gangrenous toes.  -Continue CIR therapies including PT, OT, and SLP   -pt with baseline cognitive deficits 2.  Antithrombotics: -PE/anticoagulation:  Pharmaceutical: Other (comment)--on low dose Eliquis             -antiplatelet therapy:  N/A 3. Pain Management: Oxycodone or tramadol prn.   2/24: Therapist notes that Mr. Leoni has been having hypersensitivity of his gangrenous toes. Will add low dose Gabapentin 100mg  TID to help with pain-- low dose due to ESRD.  2/28 continue gabapentin---seems to be tolerating so far  3/1: Pain is well controlled.  4. Mood: LCSW to follow for evaluation and support.    See #14             -antipsychotic agents: N/A   See #14 5. Neuropsych: This patient is not fully capable of making decisions on his own behalf. 6. Skin/Wound Care: Dry dressing to bilateral feet.               stoma closing. PEG site intact 7. Fluids/Electrolytes/Nutrition:   2/23 stopped TF  -monitor intake.   8. A fib/NSVT: On amiodarone and metoprolol for rate control.              Rate in 90's on 2/25   -isolated bradycardia yesterday   3/1: Flowsheet reviewed; rate in 90s. Will not increase rate control agents given lowe BP.   Monitor with increased activity.  -intermittent SOB, CXR demonstrates CHF. Nephrology working on volume mgt with HD 9.  ESRD: HD MWF at the end of the day to help with activity tolerance.   -volume/weight mgt per nephrology, given cxr findings Filed Weights   02/11/20 0500 02/12/20 0500 02/13/20 0312  Weight: 62.9 kg 62.8 kg 66.2 kg  10. Pericardial effusion: Has been on colchicine since 1/19 and has been weaned to prednisone 10 mg daily. Last echo 1/29 shows decrease to small effusion. 11. Hypotension: Continue midodrine 10 mg tid.  BP's still  soft 2/26, 3/1 12. Dysphagia:   Eating fairly well now  2/26- had MBS today---upgrade to regular/thins 13. Dry gangrene bilateral feet: Needs to wear shoes for protection. Dry dressing. Maintain adequate nutritional status.    -he is having ongoing pain in feet, right more than left.   -both feet cool, especially right  -wounds with some improvement but demonstrate ongoing gangrene as well  -vascular PA has seen patient. Waiting f/u by attending MD  -continue local care, elevation, pain mgt for now 14. Anxiety/depression: On Zoloft 25 mg/day.  Team support.  ECG with prolonged QTC 512  Continue melatonin  Klonopin increased to 1 mg 3 times daily ---dc bedtime dose-likely lethargic partly d/t this---avoid excessive use 15. Insomnia: Per father, he sleeps late at night at baseline.   -2/27 still having difficulties more often than not at night d/t confusion/agitation/anxiety   -resumed seroquel 25mg  qhs   -QTC actually was DECREASED last week after giving seroquel the other day.    -recheck QTC on  Tuesday  3/1: Patient states that he slept well last night. Nursing notes recommend sleep-wake chart; will place order given previous issues/questionable patient reliability.    LOS: 10 days A FACE TO FACE EVALUATION WAS PERFORMED  Clide Deutscher Cedrik Heindl 02/13/2020, 8:48 AM

## 2020-02-13 NOTE — Progress Notes (Signed)
MEWS score of 2 this is baseline for patient. He just returned from dialysis, up in his chair eating dinner at this time. Holding lopressor due to BP and givning midodrine at this time. Charge Nurse is aware

## 2020-02-13 NOTE — Progress Notes (Signed)
Speech Language Pathology Daily Session Note  Patient Details  Name: Emmanuelle Coxe MRN: [id number] Date of Birth: July 20, 1966  Today's Date: 02/13/2020 SLP Individual Time: 0830-0900 SLP Individual Time Calculation (min): 30 min  Short Term Goals: Week 2: SLP Short Term Goal 1 (Week 2): Patient will consume current diet with minimal overt s/s of aspiration with Mod I for use of swallowing compensatory strategies. SLP Short Term Goal 2 (Week 2): Patient wil utilize an increased vocal intensity at the phrase level to achieve ~75% intelligibility with Min A verbal cues. SLP Short Term Goal 3 (Week 2): Patient will recall new, daily information with Min verbal cues for use of compensatory strategies. SLP Short Term Goal 5 (Week 2): Patient will demonstrate sustained attention to functional tasks for 45 minutes with supevision verbal cues for redirection.  Skilled Therapeutic Interventions: Skilled treatment session focused on dysphagia and speech goals. SLP facilitated session by providing skilled observation with thin liquids via cup. Patient consumed large sips without overt s/s of aspiration. Recommend patient continue thin liquids. SLP also facilitated session by providing Mod-Max A multimodal cues for use of diaphragmatic breathing during sustained phonation of of the vowel /a/. Patient with severely impaired breath support and could sustain phonation for ~2-3 seconds. Patient left upright in recliner with alarm on and all needs within reach. Continue with current plan of care.      Pain No/Denies Pain   Therapy/Group: Individual Therapy  Maleigh Bagot 02/13/2020, 2:11 PM

## 2020-02-13 NOTE — Progress Notes (Signed)
Nutrition Follow-up  **RD working remotely**  DOCUMENTATION CODES:   Not applicable  INTERVENTION:   Continue Nepro Shake po BID, each supplement provides 425 kcal and 19 grams protein  Snacks TID  Continue MVI daily   NUTRITION DIAGNOSIS:   Increased nutrient needs related to acute illness, chronic illness as evidenced by estimated needs.  Ongoing.  GOAL:   Patient will meet greater than or equal to 90% of their needs  Progressing.  MONITOR:   PO intake, Supplement acceptance, Labs, Weight trends, TF tolerance  REASON FOR ASSESSMENT:   Consult Enteral/tube feeding initiation and management, Assessment of nutrition requirement/status  ASSESSMENT:   54 yo male admitted to CIR from [facility] after prolonged hospitalization post cardiac arrest on 11/16/2019 requiring trach and PEG. PMH includes ESRD on HD, CHF, HTN, cocaine abuse, medical non-adherence.  12/2 [name] Arrest 1/08 Discharged and Admitted with [facility] 1/25 G tube placed 2/17 De-Cannulated  2/19 Admit to CIR, Renal diet initiated 2/20 Diet downgraded to Dysphagia 3, Nectar 2/26 Renal diet ordered with thin liquids  Noted that nocturnal tube feeding was stopped by MD on 2/23.  Pt reports appetite is good and that he is drinking the Nepro shakes. Pt states he is "starving."  PO intake: 75-100% x last 8 recorded meals (93% average intake)  Medications reviewed and include: Vitamin C, Nepro BID, pro-stat BID, Folvite, 161ml free water BID, Rena-vit, Deltasone, thiamine  Labs reviewed: Na 133 (L)  Last HD: 2/26 Net UF: 2564ml  Plan for HD today.  Old outpatient EDW prior to admission in December was 74.5 kg; Current weight 62.6 kg; unsure of EDW at this time.  Dry gangrene bilateral feet  Diet Order:   Diet Order            Diet renal with fluid restriction Fluid restriction: 1200 mL Fluid; Room service appropriate? Yes; Fluid consistency: Thin  Diet effective  now              EDUCATION NEEDS:   Not appropriate for education at this time  Skin:  Skin Assessment: Skin Integrity Issues: Skin Integrity Issues:: Other (Comment) Other: bilateral feet with dry gangrene  Last BM:  2/28  Height:   Ht Readings from Last 1 Encounters:  02/06/20 5' 10.5" (1.791 m)    Weight:   Wt Readings from Last 1 Encounters:  02/13/20 66.2 kg    BMI:  Body mass index is 20.64 kg/m.  Estimated Nutritional Needs:   Kcal:  2200-2400 kcals  Protein:  110-120 g  Fluid:  1000 mL plus UOP   Larkin Ina, MS, RD, LDN RD pager number and weekend/on-call pager number located in [place].

## 2020-02-13 NOTE — Progress Notes (Signed)
Social Work Patient ID: [name] [name], male   DOB: [birth year], 54 y.o.   MRN: [id number]   SW submitted Medicaid transportation [facility]) referral. Reports a caseworker will reach out to him to complete a transportation assessment so he can become enrolled for Medicaid transportation.    Loralee Pacas, MSW, [employer] Office: [phone] Cell: [phone] Fax: [phone]

## 2020-02-13 NOTE — Progress Notes (Signed)
[facility]  Subjective  - no new complaints.  He states his toes do hurt, but it is tolerable.   Objective 98/84 98 98.5 F (36.9 C) 19 96%  Intake/Output Summary (Last 24 hours) at 02/13/2020 0819 Last data filed at 02/13/2020 2836 Gross per 24 hour  Intake 660 ml  Output --  Net 660 ml    Left GT and second toe tips dry gangrene Right foot tips healing with pink tips Dry guaze was placed between toes, then socks Lungs with O2 [place] support non labored breathing   Assessment/Planning: 54 y.o.malewho presented with cardiac arrest and required pressorsfor extended time now with ischemic changes to BLE  His toes were macerated.  I placed dry guaze only between the toes. Activity as tolerates.  No sign of infection with left GT and second toe tips dry gangrene.  If he develops infection or sever intolerable pain he will require toe amputations.     Roxy Horseman 02/13/2020 8:19 AM --  Laboratory Lab Results: Recent Labs    02/10/20 1149 02/10/20 1836  WBC 6.9 5.4  HGB 12.3* 13.1  HCT 41.1 43.8  PLT 167 154   BMET Recent Labs    02/10/20 1149 02/10/20 1930  NA 136 138  K 5.5* 4.3  CL 95* 94*  CO2 24 26  GLUCOSE 116* 130*  BUN 80* 26*  CREATININE 5.59* 3.09*  CALCIUM 8.9 8.8*    COAG Lab Results  Component Value Date   INR 1.6 (H) 11/16/2019   INR 1.5 (H) 11/16/2019   INR 1.36 10/27/2018   No results found for: PTT

## 2020-02-13 NOTE — Progress Notes (Signed)
[facility] Progress Note   Subjective:   Pt seen in room, resting in recliner. Wants to get up with walker, advised to wait for nurse/PT assistance. Denies SOB, orthopnea, CP, abdominal pain, N/V/D. On O2  Objective Vitals:   02/12/20 1526 02/12/20 1948 02/13/20 0312 02/13/20 0758  BP: (!) 88/75 104/84 (!) 85/66 98/84  Pulse: 84 87 90 98  Resp: 19 19 19    Temp:  98.3 F (36.8 C) 98.5 F (36.9 C)   TempSrc:      SpO2: 97% (!) 88% 96%   Weight:   66.2 kg   Height:       Physical Exam General: Well developed male, alert and in NAD Heart: RRR, no murmurs, rubs or gallops Lungs: Crackles b/l bases that clear with coughing.  Abdomen: Abdomen soft, non-tender, +BS. PEG in place with small amount of dried blood on dressing Extremities: Trace edema L ankle, boots in place Dialysis Access: L AVF + thrill  Additional Objective Labs: Basic Metabolic Panel: Recent Labs  Lab 02/10/20 1149 02/10/20 1930 02/13/20 0847  NA 136 138 133*  K 5.5* 4.3 4.8  CL 95* 94* 91*  CO2 24 26 24   GLUCOSE 116* 130* 116*  BUN 80* 26* 124*  CREATININE 5.59* 3.09* 7.06*  CALCIUM 8.9 8.8* 8.6*  PHOS 3.0 2.1* 2.7   Liver Function Tests: Recent Labs  Lab 02/10/20 1149 02/10/20 1930 02/13/20 0847  ALBUMIN 3.5 3.9 3.6   CBC: Recent Labs  Lab 02/06/20 1322 02/06/20 1322 02/08/20 1254 02/08/20 1254 02/10/20 1149 02/10/20 1836 02/13/20 0847  WBC 10.6*   < > 10.2   < > 6.9 5.4 6.8  HGB 13.1   < > 12.3*   < > 12.3* 13.1 12.3*  HCT 43.1   < > 40.4   < > 41.1 43.8 40.8  MCV 89.0  --  89.4  --  91.3 92.0 90.3  PLT 151   < > 143*   < > 167 154 196   < > = values in this interval not displayed.   Blood Culture    Component Value Date/Time   SDES BLOOD RIGHT HAND 12/17/2019 1520   SPECREQUEST  12/17/2019 1520    BOTTLES DRAWN AEROBIC ONLY Blood Culture results may not be optimal due to an inadequate volume of blood received in culture bottles   CULT  12/17/2019 1520    NO  GROWTH 5 DAYS Performed at [facility], [place] [address], [place], [place] [address]    REPTSTATUS 12/22/2019 FINAL 12/17/2019 1520     Studies/Results: DG CHEST PORT 1 VIEW  Result Date: 02/12/2020 CLINICAL DATA:  Cough and shortness of breath. EXAM: PORTABLE CHEST 1 VIEW COMPARISON:  02/08/2020 and prior studies FINDINGS: Cardiomegaly, bilateral interstitial/airspace opacities and RIGHT LOWER lung consolidation/atelectasis again noted. Bilateral pleural effusions are unchanged. There is no evidence of pneumothorax. IMPRESSION: Unchanged chest radiograph with bilateral interstitial/airspace opacities, RIGHT LOWER lung consolidation/atelectasis and pleural effusions. Electronically Signed   By: Margarette Canada M.D.   On: 02/12/2020 09:34   Medications:  . amiodarone  200 mg Oral Daily  . apixaban  2.5 mg Oral BID  . ascorbic acid  500 mg Oral BID  . benztropine  1 mg Oral BID  . clonazePAM  1 mg Oral TID WC  . colchicine  0.6 mg Oral Daily  . feeding supplement (NEPRO CARB STEADY)  237 mL Oral BID BM  . feeding supplement (PRO-STAT SUGAR FREE 64)  30 mL  Oral BID  . folic acid  1 mg Oral Daily  . free water  100 mL Per Tube BID  . gabapentin  100 mg Oral Q8H  . levothyroxine  25 mcg Oral Q0600  . mouth rinse  15 mL Mouth Rinse BID  . Melatonin  3 mg Oral QHS  . metoprolol tartrate  25 mg Oral TID WC  . midodrine  10 mg Oral TID WC  . multivitamin  1 tablet Oral QHS  . predniSONE  10 mg Oral Q breakfast  . QUEtiapine  25 mg Oral QHS  . thiamine  100 mg Oral Daily    Dialysis Orders: Prev orders - will need to change significantly on d/c MWF at [facility], 4:15hr, EDW 74.5kg, 2K/2.5Ca, AVF, heparin 5K - Calcitriol 0.85mcg PO q HD. No ESA or venofer.  Assessment/Plan: 1.Deconditioning: S/p prolonged [facility] admit (12/2-12/23/19) s/p cardiac arrest, then [facility] admit until 2/21. Now CIR. 2. ESRD:Continue HD per MWF schedule-next HD today. K+ controlled.  3. HTN/volume:BP soft,  currently on metoprolol for rate control + midodrine.CXR 2/24 and 2/28 with persistent pulm edema. Will maximize UF goal with HD today as BP tolerates 4. Anemia:Hgb12.3.- no ESA needed. 5. Secondary hyperparathyroidism:Ca ok, Phos 2.7. No binders/VDRA. 6. Nutrition/dysphagia: TFs + nectars 7. Hx cardiac arrest  8. Hx PE: On Eliquis 9. A-fib: On Eliquis + amiodarone 10. Hx pericardial effusion ->colchicine +prednisone 10mg  QD. 11. Anxiety + lethargy: Meds per primary. 12. HFrEF (<20%) 13. Hx resp failure with vent/trach: Trach decannulated/healed 14. Toe gangrene: S/p prolonged pressor use/ischemic changes to BLE - slightly improved. VVS following  Anice Paganini, PA-C 02/13/2020, 10:31 AM  [facility] Pager: [phone]

## 2020-02-14 ENCOUNTER — Inpatient Hospital Stay (HOSPITAL_COMMUNITY): Payer: Medicare Other | Admitting: Physical Therapy

## 2020-02-14 ENCOUNTER — Inpatient Hospital Stay (HOSPITAL_COMMUNITY): Payer: Medicare Other | Admitting: Occupational Therapy

## 2020-02-14 ENCOUNTER — Inpatient Hospital Stay (HOSPITAL_COMMUNITY): Payer: Medicare Other | Admitting: Speech Pathology

## 2020-02-14 LAB — RENAL FUNCTION PANEL
Albumin: 3.5 g/dL (ref 3.5–5.0)
Anion gap: 18 — ABNORMAL HIGH (ref 5–15)
BUN: 77 mg/dL — ABNORMAL HIGH (ref 6–20)
CO2: 26 mmol/L (ref 22–32)
Calcium: 8.7 mg/dL — ABNORMAL LOW (ref 8.9–10.3)
Chloride: 92 mmol/L — ABNORMAL LOW (ref 98–111)
Creatinine, Ser: 5.34 mg/dL — ABNORMAL HIGH (ref 0.61–1.24)
GFR calc Af Amer: 13 mL/min — ABNORMAL LOW (ref >60)
GFR calc non Af Amer: 11 mL/min — ABNORMAL LOW (ref >60)
Glucose, Bld: 127 mg/dL — ABNORMAL HIGH (ref 70–99)
Phosphorus: 3.6 mg/dL (ref 2.5–4.6)
Potassium: 4.1 mmol/L (ref 3.5–5.1)
Sodium: 136 mmol/L (ref 135–145)

## 2020-02-14 MED ORDER — CHLORHEXIDINE GLUCONATE CLOTH 2 % EX PADS
6.0000 | MEDICATED_PAD | Freq: Every day | CUTANEOUS | Status: DC
  Administered 2020-02-14 – 2020-02-15 (×2): 6 via TOPICAL

## 2020-02-14 NOTE — Progress Notes (Signed)
Speech Language Pathology Daily Session Note  Patient Details  Name: [name] [name] MRN: [id number] Date of Birth: December 08, 1966  Today's Date: 02/14/2020 SLP Individual Time: 1100-1125 SLP Individual Time Calculation (min): 25 min  Short Term Goals: Week 2: SLP Short Term Goal 1 (Week 2): Patient will consume current diet with minimal overt s/s of aspiration with Mod I for use of swallowing compensatory strategies. SLP Short Term Goal 2 (Week 2): Patient wil utilize an increased vocal intensity at the phrase level to achieve ~75% intelligibility with Min A verbal cues. SLP Short Term Goal 3 (Week 2): Patient will recall new, daily information with Min verbal cues for use of compensatory strategies. SLP Short Term Goal 5 (Week 2): Patient will demonstrate sustained attention to functional tasks for 45 minutes with supevision verbal cues for redirection.  Skilled Therapeutic Interventions: Skilled treatment session focused on cognitive and speech goals. SLP facilitated session by providing Min A verbal cues for recall of speech intelligibility strategies and diaphragmatic breathing exercises. Patient utilized diaphragmatic breathing at the phrase level with Min verbal cues to achieve ~90% intelligibility. Patient independently recalled events from previous therapy session and required extra time and Min verbal cues for safety awareness in regard to transfer to bed. Patient left sitting EOB with alarm on awaiting blood work. Continue with current plan of are.      Pain No/Denies Pain   Therapy/Group: Individual Therapy  Jung Yurchak 02/14/2020, 11:34 AM

## 2020-02-14 NOTE — Progress Notes (Signed)
[facility] Progress Note   Subjective:   Patient seen in room. BP soft with possible syncopal episode this AM. Pt reports feeling well and denies CP, palpitations, dizziness, SOB, abdominal pain, N/V/D.   Objective Vitals:   02/14/20 0325 02/14/20 0615 02/14/20 0748 02/14/20 0839  BP: 103/80 (!) 125/103 (!) 88/63 (!) 88/72  Pulse: 98 84 98 97  Resp: 20     Temp: 97.7 F (36.5 C)     TempSrc: Axillary     SpO2: 100% 96%  97%  Weight: 63.8 kg     Height:       Physical Exam General: Well developed male, alert and in NAD Heart: RRR, no murmurs, rubs or gallops Lungs: Lungs CTA without wheezing, rhonchi or rales Abdomen: Abdomen soft, non-tender, +BS. PEG in place  Extremities: No ankle edema, feet in boots Dialysis Access: L AVF + thrill  Additional Objective Labs: Basic Metabolic Panel: Recent Labs  Lab 02/10/20 1149 02/10/20 1930 02/13/20 0847  NA 136 138 133*  K 5.5* 4.3 4.8  CL 95* 94* 91*  CO2 24 26 24   GLUCOSE 116* 130* 116*  BUN 80* 26* 124*  CREATININE 5.59* 3.09* 7.06*  CALCIUM 8.9 8.8* 8.6*  PHOS 3.0 2.1* 2.7   Liver Function Tests: Recent Labs  Lab 02/10/20 1149 02/10/20 1930 02/13/20 0847  ALBUMIN 3.5 3.9 3.6   CBC: Recent Labs  Lab 02/08/20 1254 02/08/20 1254 02/10/20 1149 02/10/20 1836 02/13/20 0847  WBC 10.2   < > 6.9 5.4 6.8  HGB 12.3*   < > 12.3* 13.1 12.3*  HCT 40.4   < > 41.1 43.8 40.8  MCV 89.4  --  91.3 92.0 90.3  PLT 143*   < > 167 154 196   < > = values in this interval not displayed.   Blood Culture    Component Value Date/Time   SDES BLOOD RIGHT HAND 12/17/2019 1520   SPECREQUEST  12/17/2019 1520    BOTTLES DRAWN AEROBIC ONLY Blood Culture results may not be optimal due to an inadequate volume of blood received in culture bottles   CULT  12/17/2019 1520    NO GROWTH 5 DAYS Performed at [facility], [place] [address] [place], [place] [address]    REPTSTATUS 12/22/2019 FINAL 12/17/2019 1520     Medications:  . amiodarone  200 mg Oral Daily  . apixaban  2.5 mg Oral BID  . ascorbic acid  500 mg Oral BID  . benztropine  1 mg Oral BID  . clonazePAM  1 mg Oral TID WC  . colchicine  0.6 mg Oral Daily  . feeding supplement (PRO-STAT SUGAR FREE 64)  30 mL Oral BID  . folic acid  1 mg Oral Daily  . free water  100 mL Per Tube BID  . gabapentin  100 mg Oral Q8H  . levothyroxine  25 mcg Oral Q0600  . mouth rinse  15 mL Mouth Rinse BID  . Melatonin  3 mg Oral QHS  . metoprolol tartrate  25 mg Oral TID WC  . midodrine  10 mg Oral TID WC  . multivitamin  1 tablet Oral QHS  . predniSONE  10 mg Oral Q breakfast  . QUEtiapine  25 mg Oral QHS  . thiamine  100 mg Oral Daily    Dialysis Orders: Prev orders - will need to change significantly on d/c MWF at [facility], 4:15hr, EDW 74.5kg, 2K/2.5Ca, AVF, heparin 5K - Calcitriol 0.85mcg PO q HD. No  ESA or venofer.  Assessment/Plan: 1.Deconditioning: S/p prolonged [facility] admit (12/2-12/23/19) s/p cardiac arrest, then [facility] admit until 2/21. Now CIR. 2. ESRD:Continue HD per MWF schedule-next 3/3. K+ controlled. Of note, BUN 124 before HD yesterday. Repeat labs today, will run full 4hr 15 min as schedule allows.  3. HTN/volume:BP low this AM with possible syncopal episode, currently on metoprolol for rate control + midodrine.CXR 2/24and 2/28withpersistent pulm edema and patient still on O2. 3L off with HD yesterday but BP not tolerating, will aim for 2-2.5L tomorrow.  4. Anemia:Hgb12.3.- no ESA needed. 5. Secondary hyperparathyroidism:[place], Phos 2.7.No binders/VDRA. 6. Nutrition/dysphagia: TFs + nectars 7. Hx cardiac arrest  8. Hx PE: On Eliquis 9. A-fib: On Eliquis + amiodarone 10. Hx pericardial effusion ->colchicine +prednisone 10mg  QD. 11. Anxiety + lethargy: Meds per primary. 12. HFrEF (<20%) 13. Hx resp failure with vent/trach: Trach decannulated/healed 14. Toe gangrene: S/p prolonged pressor use/ischemic changes to BLE    Anice Paganini, PA-C  [place], PA-C 02/14/2020, 11:24 AM  [facility] Pager: [phone]

## 2020-02-14 NOTE — Progress Notes (Addendum)
[place] PHYSICAL MEDICINE & REHABILITATION PROGRESS NOTE  Subjective/Complaints: Still with bilateral foot/toe pain Woke up early this morning feeling anxious.  bp's soft in PT this morning--denies dizziness, sob  ROS: Patient denies fever, rash, sore throat, blurred vision, nausea, vomiting, diarrhea, cough, shortness of breath or chest pain,  Headache .    Objective: Vital Signs: Blood pressure (!) 88/72, pulse 97, temperature 97.7 F (36.5 C), temperature source Axillary, resp. rate 20, height 5' 10.5" (1.791 m), weight 63.8 kg, SpO2 97 %. No results found. Recent Labs    02/13/20 0847  WBC 6.8  HGB 12.3*  HCT 40.8  PLT 196   Recent Labs    02/13/20 0847  NA 133*  K 4.8  CL 91*  CO2 24  GLUCOSE 116*  BUN 124*  CREATININE 7.06*  CALCIUM 8.6*    Physical Exam: BP (!) 88/72 (BP Location: Right Arm)   Pulse 97   Temp 97.7 F (36.5 C) (Axillary)   Resp 20   Ht 5' 10.5" (1.791 m)   Wt 63.8 kg   SpO2 97%   BMI 19.90 kg/m  Constitutional: No distress . Vital signs reviewed. HEENT: EOMI, oral membranes moist Neck: supple Cardiovascular: RRR without murmur. No JVD    Respiratory/Chest: CTA Bilaterally without wheezes or rales. Normal effort    GI/Abdomen: BS +, non-tender, non-distended Ext: no clubbing, tr LE edema Skin:   Bilateral toes gangrenous, right more involved than left, esp great toe--stable appearance Psych: fairly engaging today Musc: No edema in extremities.  No tenderness in extremities. Neurological: alert, dysarthric speech. Processing better. Better awareness Motor: 3-4 -/5 moves all 4's.    Assessment/Plan: 1. Functional deficits secondary to encephalopathy which require 3+ hours per day of interdisciplinary therapy in a comprehensive inpatient rehab setting.  Physiatrist is providing close team supervision and 24 hour management of active medical problems listed below.  Physiatrist and rehab team continue to assess barriers to  discharge/monitor patient progress toward functional and medical goals  Care Tool:  Bathing  Bathing activity did not occur: Refused Body parts bathed by patient: Buttocks, Front perineal area   Body parts bathed by helper: Abdomen, Right lower leg, Left lower leg     Bathing assist Assist Level: Supervision/Verbal cueing     Upper Body Dressing/Undressing Upper body dressing Upper body dressing/undressing activity did not occur (including orthotics): N/A What is the patient wearing?: Pull over shirt    Upper body assist Assist Level: Moderate Assistance - Patient 50 - 74%    Lower Body Dressing/Undressing Lower body dressing    Lower body dressing activity did not occur: N/A What is the patient wearing?: Underwear/pull up, Pants     Lower body assist Assist for lower body dressing: Moderate Assistance - Patient 50 - 74%     Toileting Toileting    Toileting assist Assist for toileting: Minimal Assistance - Patient > 75%     Transfers Chair/bed transfer  Transfers assist     Chair/bed transfer assist level: Contact Guard/Touching assist     Locomotion Ambulation   Ambulation assist   Ambulation activity did not occur: N/A  Assist level: Contact Guard/Touching assist Assistive device: Walker-rolling Max distance: 100 ft   Walk 10 feet activity   Assist     Assist level: Contact Guard/Touching assist Assistive device: Walker-rolling   Walk 50 feet activity   Assist    Assist level: Contact Guard/Touching assist Assistive device: Walker-rolling    Walk 150 feet activity  Assist Walk 150 feet activity did not occur: Safety/medical concerns         Walk 10 feet on uneven surface  activity   Assist Walk 10 feet on uneven surfaces activity did not occur: Safety/medical concerns         Wheelchair     Assist Will patient use wheelchair at discharge?: Yes Type of Wheelchair: Manual Wheelchair activity did not occur:  N/A  Wheelchair assist level: Supervision/Verbal cueing, Maximal Assistance - Patient 25 - 49% Max wheelchair distance: 75 ft    Wheelchair 50 feet with 2 turns activity    Assist        Assist Level: Supervision/Verbal cueing, Maximal Assistance - Patient 25 - 49%   Wheelchair 150 feet activity     Assist     Assist Level: Moderate Assistance - Patient 50 - 74%      Medical Problem List and Plan: 1.  Deficits with mobility, endurance, self-care secondary to encephalopathy with debility, PVD with gangrenous toes.  -Continue CIR therapies including PT, OT, and SLP   -pt with baseline cognitive deficits  -team conference today  -ELOS 3/9 2.  Antithrombotics: -PE/anticoagulation:  Pharmaceutical: Other (comment)--on low dose Eliquis             -antiplatelet therapy:  N/A 3. Pain Management: Oxycodone or tramadol prn.   2/24: Therapist notes that Mr. Livecchi has been having hypersensitivity of his gangrenous toes. Will add low dose Gabapentin 100mg  TID to help with pain-- low dose due to ESRD.  2/28 continue gabapentin---seems to be tolerating so far  3/1: Pain is well controlled.  4. Mood: LCSW to follow for evaluation and support.    See #14             -antipsychotic agents: N/A   See #14 5. Neuropsych: This patient is not fully capable of making decisions on his own behalf. 6. Skin/Wound Care: Dry dressing to bilateral feet.              stoma closing. PEG site intact 7. Fluids/Electrolytes/Nutrition:   2/23 stopped TF  -monitor intake.   8. A fib/NSVT: On amiodarone and metoprolol for rate control.              Rate in 90's on 2/25   -isolated bradycardia yesterday   3/2---rates remain in 90's   -see #9.   Monitor with increased activity.  -intermittent SOB, CXR demonstrates CHF. Nephrology working on volume mgt with HD 9.  ESRD: HD MWF at the end of the day to help with activity tolerance.   -volume/weight mgt per nephrology, weights down  3/2 may need  to reduce volume taken off in HD [facility] Weights   02/13/20 1222 02/13/20 1636 02/14/20 0325  Weight: 67.4 kg 63.6 kg 63.8 kg  10. Pericardial effusion: Has been on colchicine since 1/19 and has been weaned to prednisone 10 mg daily. Last echo 1/29 shows decrease to small effusion. 11. Hypotension: Continue midodrine 10 mg tid.  BP's still soft 2/26, 3/1 12. Dysphagia:   Eating fairly well now  2/26- had MBS today---upgrade to regular/thins 13. Dry gangrene bilateral feet: Needs to wear shoes for protection. Dry dressing. Maintain adequate nutritional status.    -he is having ongoing pain in feet, right more than left.   -both feet cool, especially right  -wounds appear stable to improved but demonstrate ongoing gangrene as well  3/2 -vascular following patient. May need toe amps eventually  -continue local care, pain  mgt 14. Anxiety/depression: On Zoloft 25 mg/day.  Team support.  ECG with prolonged QTC 512  Continue melatonin  Klonopin increased to 1 mg 3 times daily ---dc bedtime dose-likely lethargic partly d/t this---avoid excessive use 15. Insomnia: Per father, he sleeps late at night at baseline.   -2/27 still having difficulties more often than not at night d/t confusion/agitation/anxiety   -resumed seroquel 25mg  qhs   -QTC actually was DECREASED last week after giving seroquel the other day.       3/2 sleep generally improved   -has baseline insomnia   -pain and anxiety often inhibit sleep   -QTC further decreased on today's EKG which I reviewed  LOS: 11 days A FACE TO FACE EVALUATION WAS PERFORMED  Meredith Staggers 02/14/2020, 10:50 AM

## 2020-02-14 NOTE — Progress Notes (Signed)
Writer spoke with Dr Naaman Plummer in regards to patients BP being 102/79, pulse 98, Patient is scheduled lopressor 25mg . Dr Naaman Plummer stated to hold this dose.

## 2020-02-14 NOTE — Progress Notes (Signed)
Pt slept mostly through the night. At [place] pt requested to soak bilat feet with normal saline. Sat pt up on edge of bed, pt fell back onto bed and his body stiffened for a few seconds. Pt stated he felt very dizzy and sick, Assist PT to high fowlers and assessed VS. VS stable. Pt possible had a hypotensive episode.  Pt refused to lay in bed and wanted to stay on edge of bed. Stayed with pt for 30 min and did dressing change to bilat feet. Pt stood up to walk around the room and he did well. Pt up in chair. Chair alarm in place. No signs of distress at this time. Call light in reach.

## 2020-02-14 NOTE — Progress Notes (Signed)
Occupational Therapy Session Note  Patient Details  Name: [name] [name] MRN: [id number] Date of Birth: 1966/01/02  Today's Date: 02/14/2020 OT Individual Time: 1320-1400 OT Individual Time Calculation (min): 40 min  and Today's Date: 02/14/2020 OT Missed Time: 20 Minutes Missed Time Reason: Other (comment)(Pt eating lunch)  Short Term Goals: Week 2:  OT Short Term Goal 1 (Week 2): Pt will complete sit<>stand with no more than CGA within BADL tasks OT Short Term Goal 2 (Week 2): Pt will complete LB dressing with no more than min A OT Short Term Goal 3 (Week 2): Pt will don [facility] boots with no more than min A  Skilled Therapeutic Interventions/Progress Updates:    Pt greeted transferring to recliner with nursing staff. OT took over and provided supervision for pivot to recliner. Pt able to open conatiners for lunch including open mayo and ranch dressing packets. OT left pt to finish eating as he demonstrated proficiency setting up meal. OT provided pt with Darco shoes and pt was able to achieve figure 4 position to don them with min verbal cues for Velcro positioning. Pt ambulated to therapy gym with RW and close supervision with intermittent CGA when turning to sit down on therapy mat to rest. Pt on 2L of O2 throughout session. Pt completed 5 mins on [employer] arm bike for UB strength/endurance. Pt ambulated back to room in similar fashion and was left seated in recliner with chair alarm on, call bell in reach, and needs met.   Therapy Documentation Precautions:  Precautions Precautions: Fall Precaution Comments: Peg, Darco shoes Restrictions Weight Bearing Restrictions: No General: General OT Amount of Missed Time: 20 Minutes Pain:   Pt reports pain in B LE, but no number given  Therapy/Group: Individual Therapy  Valma Cava 02/14/2020, 3:27 PM

## 2020-02-14 NOTE — Progress Notes (Signed)
Renal Navigator received returned call from patient's mother/[name] [name] from a message left last week in regards to patient's OP HD clinic/schedule. Navigator explained to [name] [name] that she and patient will not be permitted to attend the same shift at the same clinic and that patient would need to wait until after discharge from [place], since discharge is planned for 02/21/20, in order to request transfer from [facility] clinic to [facility] clinic due to the fact that [place] will not be back in the office to consider transfer request until 02/20/20 and this request cannot delay CIR discharge. [name] [name] was understanding for the most part, but stated questions about reasoning for patient to not be allowed to be on the same shift as she is, as it would prove much easier for their family in regards to transportation for her husband who takes her to [facility] on TTS and patient to [place] on MWF. Renal Navigator validated her concerns and attempted to explain clinics' rationale as not wanting family members present if the other one codes during HD. Navigator explained that she has the ability to request to discuss this further with the clinic staff, but that this is not Renal Navigator's decision. Renal Navigator offered to speak with [facility] clinic to see if patient's schedule could be moved to a TTS schedule (with times not exactly the same as [name] [name]'s at [facility]), so that patient's husband would only be providing transportation 3 days per week instead of 6, explaining that they could further discuss this matter after patient's discharge from CIR. Patient's mother seemed satisfied. Renal Navigator spoke with [facility] clinic staff who provided a 12:30pm seat time on TTS. Patient's mother was appreciative and Navigator asked her to speak with patient about this and call Navigator back tomorrow with an answer on whether this would help the family. Navigator Secondary school teacher of Operations/A. Owens Shark of the situation.   Alphonzo Cruise, [place] Renal Navigator [phone]

## 2020-02-14 NOTE — Patient Care Conference (Signed)
Inpatient RehabilitationTeam Conference and Plan of Care Update Date: 02/14/2020   Time: 10:40 AM   Patient Name: [name] [name]      Medical Record Number: [id number]  Date of Birth: 1966/09/17 Sex: Male         Room/Bed: 4M02C/4M02C-01 Payor Info: Payor: MEDICARE / Plan: MEDICARE PART A AND B / Product Type: *No Product type* /    Admit Date/Time:  02/03/2020  5:40 PM  Primary Diagnosis:  Encephalopathy  Patient Active Problem List   Diagnosis Date Noted  . Lethargy   . Sleep disturbance   . Generalized anxiety disorder   . Dysphagia   . Somnolence   . Encephalopathy   . Debility   . ESRD on dialysis ([place])   . Dry gangrene ([place])   . Hypotension   . S/P percutaneous endoscopic gastrostomy (PEG) tube placement ([place])   . Pericardial effusion   . Atrial fibrillation ([place])   . Acute on chronic respiratory failure with hypoxia ([place])   . End stage renal disease on dialysis ([place])   . Acute on chronic systolic and diastolic heart failure, NYHA class 4 ([place])   . Chronic atrial fibrillation ([place])   . Acute pulmonary embolism without acute cor pulmonale (HCC)   . Tracheostomy status ([place])   . Adult failure to thrive   . Ventilator dependent ([place])   . Chronic respiratory failure with hypoxia ([place])   . 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 Tracheostomy in place [facility])   . Endotracheally intubated   . Palliative care by specialist   . Pressure injury of skin 11/19/2019  . Acute respiratory failure ([place])   . ESRD on hemodialysis ([place])   . Cardiac arrest ([place]) 11/16/2019  . Paroxysmal atrial fibrillation (HCC)   . Superficial venous thrombosis of right arm 11/05/2018  . Nonischemic cardiomyopathy ([place]) 11/03/2018  . Essential hypertension 11/03/2018  . ESRD needing dialysis ([place]) 10/26/2018  . Prolonged Q-T interval on ECG 05/18/2017  . Hernia, inguinal, right 08/05/2016  . Acute on chronic systolic CHF (congestive heart failure) ([place])   . Solitary pulmonary nodule 10/10/2015  . Chronic  combined systolic and diastolic heart failure ([place]) 04/18/2014  . Anemia due to blood loss, acute 07/15/2012  . Hypocalcemia 07/15/2012  . Cigarette smoker 11/26/2010    Expected Discharge Date: Expected Discharge Date: 02/21/20  Team Members Present: Physician leading conference: Dr. Alger Simons Care Coodinator Present: Loralee Pacas, LCSWA;Genie Jeffren Dombek, RN, MSN Nurse Present: Other (comment)(Amanda Archie, RN) PT Present: Lavone Nian, PT OT Present: Cherylynn Ridges, OT SLP Present: Weston Anna, SLP PPS Coordinator present : Ileana Ladd, Burna Mortimer, SLP     Current Status/Progress Goal Weekly Team Focus  Bowel/Bladder   Pt is continent of B&B, LBM 02/13/20. Pt is anuric r/t dialysis  Maintain continence of Bowel  Assess continence every shift/ PRN   Swallow/Nutrition/ Hydration   Regular textures with thin liquids, Mod I  Min A  Goal met   ADL's   Min A overall  Supervision overall  activity tolerance, endurance, self-care retraining, safety awareness, dc planning   Mobility   CGA sit<>stand & stand pivot with RW, CGA gait up to 100 ft with RW, min assist x 1 step with B rails  supervision overall except CGA bed<>chair & min assist car transfer & stairs with rails per home setup  activity tolerance, transfers, bed mobility, gait, endurance, stairs, balance, d/c planning, pt education   Communication   Min-Mod A  Min A  use of speech  intelligibility goals   Safety/Cognition/ Behavioral Observations  Min-Mod A  Min A  attention, awareness, recall with use of strategies.   Pain   Patient complains of pain 8 out of 10 to bilat feet, Pt asks for PRN pain meds  Keep pain 3 or less  Assess pain every shift/PRN, Give PRN pain meds when requested   Skin   Pt has pegtube to left side of abdomen with minimal serosangineous drainage, abrasions to bilat knees, open wounds to bilat toes and generalized brusing to left AV fistula  Prevent further skin breakdown  Assess skin  every shift/PRN, Continue with BID dressing changes to bilat toes    Rehab Goals Patient on target to meet rehab goals: Yes *See Care Plan and progress notes for long and short-term goals.     Barriers to Discharge  Current Status/Progress Possible Resolutions Date Resolved   Nursing                  PT  Medical stability;Home environment access/layout;Wound Care;Hemodialysis;New oxygen;Lack of/limited family support;Behavior  steps without rails to enter home, unsure if father can provide physical assist at d/c              OT                  SLP                SW Decreased caregiver support;Lack of/limited family support Pt father provides assistance to pt and his wife to/from dialysis M-F.            Discharge Planning/Teaching Needs:  Discharge to home with support from pt mother (supervision), and pt father to assist with transportation to/from appointments.  Family education as recommended by rehab therapy team.   Team Discussion: Anxiety, BP soft, nephrology following, HD M-W-F schedule, TEDs, binder prn, may need toe amputations as outpatient, feet are cold.  RN wound care to toes, BP low, eating well, TFs stopped, impulsive, gets OOB by himself.  OT min a overall, S goals.  PT S transfers, CGA gait, putting ramp at house, min a car transfers.  SLP Reg thins, working on [employer] and voice.  Per patient, Dad is his only support.   Revisions to Treatment Plan: N/A     Medical Summary Current Status: sleeping a little better. feet/toes still painful. bp low after HD. more alert, intake better Weekly Focus/Goal: bp, pain, vascular mgt, nutrition. wound care  Barriers to Discharge: Medical stability   Possible Resolutions to Barriers: see medical progress notes   Continued Need for Acute Rehabilitation Level of Care: The patient requires daily medical management by a physician with specialized training in physical medicine and rehabilitation for the following  reasons: Direction of a multidisciplinary physical rehabilitation program to maximize functional independence : Yes Medical management of patient stability for increased activity during participation in an intensive rehabilitation regime.: Yes Analysis of laboratory values and/or radiology reports with any subsequent need for medication adjustment and/or medical intervention. : Yes   I attest that I was present, lead the team conference, and concur with the assessment and plan of the team.   Jodell Cipro M 02/14/2020, 4:14 PM   Team conference was held via web/ teleconference due to [place] - 19

## 2020-02-14 NOTE — Progress Notes (Signed)
Renal Navigator met with patient at bedside in CIR. He appeared very tired, but welcomed Navigator in to the room. Navigator updated patient on discussion with his mother yesterday and plan to switch patient to TTS schedule at [place] to help ease transportation burden on his father (who also takes patient's mother to OP HD at [facility] TTS first shift).  Renal Navigator asked patient in a very straight forward way, "where do you want to go for outpatient dialysis." Patient responded, "I want to be off [place]. I want to go to [facility]." Renal Navigator knows that patient will do whatever is easiest for his parents, but thanked him for sharing what his wishes are, and commented this to him. He is comfortable with plan to change to TTS schedule. Renal Navigator explained that we can do this now and that he and his mother can still discuss transfer to [facility] in the future if that is still the route they want to take once Medical Director at [facility] returns to the office. Patient in agreement. Renal Navigator left message for patient's mother to return call to discuss above. Renal Navigator spoke with [facility] clinic staff to inform that patient would like to change to TTS schedule. It may even be possible to put patient on first shift, slightly later than his mother's [place] time, and they are evaluating this. Clinic staff to call Navigator back after discussing with Clinic Manager. Renal Navigator to follow up and will provide written letter to patient with new schedule once confirmed.   Alphonzo Cruise, [place] Renal Navigator [phone]

## 2020-02-14 NOTE — Progress Notes (Addendum)
Physical Therapy Session Note  Patient Details  Name: [name] [name] MRN: [id number] Date of Birth: 06-17-1966  Today's Date: 02/14/2020 PT Individual Time: 7782-4235 and 1000-1027 PT Individual Time Calculation (min): 54 min and 27 min  Short Term Goals: Week 2:  PT Short Term Goal 1 (Week 2): Pt will negotiate 4 steps with B rails with min assist. PT Short Term Goal 2 (Week 2): Pt will complete car transfer with LRAD & min assist. PT Short Term Goal 3 (Week 2): Pt will ambulate 100 ft with LRAD & min assist.  Skilled Therapeutic Interventions/Progress Updates:  Treatment 1: Pt received in recliner & agreeable to tx. Pt dons darco shoes sitting in recliner with min assist for straps around ankles. Sit<>stand and stand pivot to w/c with RW & supervision. Contacted pt's father [name]) via telephone who confirms they are installing ramps at his house so pt will not have to negotiate stairs at home. Discussed car transfer with pt reporting his father has a car & a truck with therapist recommending pt ride home in car. Pt completes car transfer at sedan simulated height with min assist to boost up out of car 2/2 low seat height. Pt c/o "my feet are killing me" - nurse made aware & administered meds. Vitals checked: BP = 88/72 mmHg (RUE) & nurse aware of low BP already this AM, HR = 97 bpm, SpO2 = 97% on 2L/min but pt reporting "I feel good". Pt returned to room & to recliner in same manner as noted prior. Therapist donned ace wraps to distal LE for BP management. Pt performed the following BLE strengthening exercises: long arc quads, seated hip flexion (minimal floor clearance RLE, unable to clear floor LLE), and hip adduction pillow squeezes with instructional cuing for technique. Pt left in recliner with chair alarm donned & all needs in reach.   Treatment 2: Pt received in recliner with nasal cannula off & therapist donned it with pt on 2L/min supplemental oxygen. SpO2 > or = 90% throughout session. BP  at rest = 107/75 mmHg (RUE, sitting). Pt transfers sit<>stand and stand pivot recliner<>w/c with RW & supervision. In gym, pt negotiates 8 steps (3") with B rails and min assist for strengthening with more weakness in LLE compared to RLE noted, & pt self selecting step over step pattern. Pt propels w/c gym>room with BUE with supervision for clear hallways but max assist for doorways & small spaces in room. Pt completes 5x sit<>stand from recliner for strengthening. Pt left in recliner with chair alarm donned & all needs in reach, nurse in room.  Pt did not voice any c/o pain during session.  Therapy Documentation Precautions:  Precautions Precautions: Fall Precaution Comments: Peg, Darco shoes Restrictions Weight Bearing Restrictions: No   Therapy/Group: Individual Therapy  [name] 02/14/2020, 10:50 AM

## 2020-02-15 ENCOUNTER — Inpatient Hospital Stay (HOSPITAL_COMMUNITY): Payer: Medicare Other | Admitting: Speech Pathology

## 2020-02-15 ENCOUNTER — Inpatient Hospital Stay (HOSPITAL_COMMUNITY): Payer: Medicare Other

## 2020-02-15 ENCOUNTER — Inpatient Hospital Stay (HOSPITAL_COMMUNITY): Payer: Medicare Other | Admitting: Physical Therapy

## 2020-02-15 LAB — CBC
HCT: 40.6 % (ref 39.0–52.0)
Hemoglobin: 12.4 g/dL — ABNORMAL LOW (ref 13.0–17.0)
MCH: 27.1 pg (ref 26.0–34.0)
MCHC: 30.5 g/dL (ref 30.0–36.0)
MCV: 88.6 fL (ref 80.0–100.0)
Platelets: 229 10*3/uL (ref 150–400)
RBC: 4.58 MIL/uL (ref 4.22–5.81)
RDW: 21.2 % — ABNORMAL HIGH (ref 11.5–15.5)
WBC: 7.4 10*3/uL (ref 4.0–10.5)
nRBC: 0.4 % — ABNORMAL HIGH (ref 0.0–0.2)

## 2020-02-15 LAB — RENAL FUNCTION PANEL
Albumin: 3.6 g/dL (ref 3.5–5.0)
Anion gap: 19 — ABNORMAL HIGH (ref 5–15)
BUN: 112 mg/dL — ABNORMAL HIGH (ref 6–20)
CO2: 23 mmol/L (ref 22–32)
Calcium: 8.7 mg/dL — ABNORMAL LOW (ref 8.9–10.3)
Chloride: 91 mmol/L — ABNORMAL LOW (ref 98–111)
Creatinine, Ser: 6.75 mg/dL — ABNORMAL HIGH (ref 0.61–1.24)
GFR calc Af Amer: 10 mL/min — ABNORMAL LOW (ref >60)
GFR calc non Af Amer: 9 mL/min — ABNORMAL LOW (ref >60)
Glucose, Bld: 112 mg/dL — ABNORMAL HIGH (ref 70–99)
Phosphorus: 4.9 mg/dL — ABNORMAL HIGH (ref 2.5–4.6)
Potassium: 4.9 mmol/L (ref 3.5–5.1)
Sodium: 133 mmol/L — ABNORMAL LOW (ref 135–145)

## 2020-02-15 MED ORDER — MIDODRINE HCL 5 MG PO TABS: 10.0000 mg | ORAL_TABLET | Freq: Once | ORAL | Status: AC | PRN

## 2020-02-15 MED ORDER — MIDODRINE HCL 5 MG PO TABS
ORAL_TABLET | ORAL | Status: AC
  Administered 2020-02-15: 10 mg via ORAL
  Filled 2020-02-15: qty 2

## 2020-02-15 MED ORDER — HEPARIN SODIUM (PORCINE) 1000 UNIT/ML DIALYSIS: 20.0000 [IU]/kg | INTRAMUSCULAR | Status: DC | PRN

## 2020-02-15 MED ORDER — HEPARIN SODIUM (PORCINE) 1000 UNIT/ML IJ SOLN
INTRAMUSCULAR | Status: AC
  Administered 2020-02-15: 1300 [IU] via INTRAVENOUS_CENTRAL
  Filled 2020-02-15: qty 2

## 2020-02-15 NOTE — Progress Notes (Signed)
Speech Language Pathology Daily Session Note  Patient Details  Name: Saleh Ulbrich MRN: [id number] Date of Birth: 12/25/1965  Today's Date: 02/15/2020 SLP Individual Time: 0710-0805 SLP Individual Time Calculation (min): 55 min  Short Term Goals: Week 2: SLP Short Term Goal 1 (Week 2): Patient will consume current diet with minimal overt s/s of aspiration with Mod I for use of swallowing compensatory strategies. SLP Short Term Goal 2 (Week 2): Patient wil utilize an increased vocal intensity at the phrase level to achieve ~75% intelligibility with Min A verbal cues. SLP Short Term Goal 3 (Week 2): Patient will recall new, daily information with Min verbal cues for use of compensatory strategies. SLP Short Term Goal 5 (Week 2): Patient will demonstrate sustained attention to functional tasks for 45 minutes with supevision verbal cues for redirection.  Skilled Therapeutic Interventions: Skilled treatment session focused on dysphagia and cognitive goals. SLP facilitated session by providing skilled observation with breakfast meal of regular textures with thin liquids. Patient consumed meal without overt s/s of aspiration and was overall Mod I for use of swallowing compensatory strategies. Recommend patient continue current diet. Patient asking questions that were answered by SLP in previous session, therefore, SLP initiated the use of a memory notebook to maximize recall of timeline for hospitalization and events from previous therapy sessions. Patient was lethargic throughout session and required Mod verbal cues for arousal. Patient left upright in wheelchair with alarm on and all needs within reach. Continue with current plan of care.        Pain Pain Assessment Pain Score: 7   Therapy/Group: Individual Therapy  Shondrika Hoque 02/15/2020, 8:10 AM

## 2020-02-15 NOTE — Progress Notes (Addendum)
Physical Therapy Weekly Progress Note  Patient Details  Name: [name] [name] MRN: [id number] Date of Birth: [birth year]  Beginning of progress report period: February 10, 2020 End of progress report period: February 15, 2020  Today's Date: 02/15/2020 PT Individual Time: 1101-1157 PT Individual Time Calculation (min): 56 min   Patient has met 3 of 3 short term goals.  Pt is making good progress towards LTGs. Pt currently requires supervision for sit<>stand and stand pivot transfers with RW and can ambulate with RW & CGA. Pt would benefit from continued skilled PT treatment to progress independence with all mobility & to focus on strengthening, endurance, & balance training.   Patient continues to demonstrate the following deficits muscle weakness, decreased cardiorespiratoy endurance and decreased oxygen support, decreased coordination, decreased awareness, decreased problem solving and decreased safety awareness, and decreased standing balance, decreased postural control and decreased balance strategies and therefore will continue to benefit from skilled PT intervention to increase functional independence with mobility.  Patient progressing toward long term goals..  Continue plan of care.  PT Short Term Goals Week 2:  PT Short Term Goal 1 (Week 2): Pt will negotiate 4 steps with B rails with min assist. PT Short Term Goal 1 - Progress (Week 2): Met PT Short Term Goal 2 (Week 2): Pt will complete car transfer with LRAD & min assist. PT Short Term Goal 2 - Progress (Week 2): Met PT Short Term Goal 3 (Week 2): Pt will ambulate 100 ft with LRAD & min assist. PT Short Term Goal 3 - Progress (Week 2): Met Week 3:  PT Short Term Goal 1 (Week 3): STG = LTG due to estimated d/c date.  Skilled Therapeutic Interventions/Progress Updates:  Pt received asleep in recliner with nasal cannula off with therapist donning it (nurse made aware) & pt awakening with therapist also providing wet cloth for pt to wipe  face to increase alertness. SpO2 = 99% on 2L/min, HR = 92 bpm, BP = 97/75 mmHg (RUE) so therapist applied ace wraps to BLE for BP management. Pt agreeable to tx and transfers sit>stand & stand pivot to w/c with RW & close supervision. Stand pivot w/c<>nu-step with RW & CGA. Pt utilized nu-step on level 1 x 10 minutes with all four extremities with task focusing on cardiopulmonary endurance training & global strengthening; pt frequently nodding off requiring max cuing to increase alertness & attend to task. Played music to attempt to increase pt's alertness during activity. Pt returned to w/c & BP = 89/76 mmHg (RUE). Pt propels w/c dayroom>room with supervision<>min assist for hallways, up to max assist for doorways with cuing to attend to obstacles on L. Pt transfers to bed with RW & CGA and sit>supine with supervision with use of BUE to assist BLE into bed. Pt left in bed with alarm set & all needs in reach, nurse made aware of pt's lethargy & low BP.  Pt remains lethargic throughout session, reporting he did not sleep well last night.   Therapy Documentation Precautions:  Precautions Precautions: Fall Precaution Comments: Peg, Darco shoes Restrictions Weight Bearing Restrictions: No   Pain: 8/10 pain in B feet - nurse made aware & administered pain meds.   Therapy/Group: Individual Therapy  [name] 02/15/2020, 12:06 PM

## 2020-02-15 NOTE — Progress Notes (Signed)
Occupational Therapy Session Note  Patient Details  Name: [name] [name] MRN: [id number] Date of Birth: [birth year]  Today's Date: 02/15/2020 OT Individual Time: 4193-7902 OT Individual Time Calculation (min): 56 min    Short Term Goals: Week 1:  OT Short Term Goal 1 (Week 1): STG=LTG  Skilled Therapeutic Interventions/Progress Updates:      Therapy Documentation 1:1. Pt received in recliner agreeable OT. tp completes amublatory transfer to stand at sink with RW. Pt completes grooming with S/set up in standing at sink to improve endurance. Edu re walk in shower options/RW posterior method and pt able to complete with CGA fading to S for stepping forward/backward over threshold with RW. Pt completes 2x5 sit to stand at [facility] at 23" (min A) then decreasing to 21" (MOD A) with no UE support to power up with MAX VC for anterior trunk flexion. Exited session with p tseated in recliner, call light in reach and all needs met.  Precautions:  Precautions Precautions: Fall Precaution Comments: Peg, Darco shoes Restrictions Weight Bearing Restrictions: No General:   Vital Signs: Therapy Vitals Pulse Rate: 95 BP: 105/81 Pain: Pain Assessment Pain Scale: 0-10 Pain Score: 3  Pain Type: Chronic pain Pain Location: Foot Pain Orientation: Right;Left Pain Descriptors / Indicators: Aching Pain Frequency: Constant Pain Onset: On-going Patients Stated Pain Goal: 2 Pain Intervention(s): Medication (See eMAR) ADL:   Vision   Perception    Praxis   Exercises:   Other Treatments:     Therapy/Group: Individual Therapy  Tonny Branch 02/15/2020, 10:12 AM

## 2020-02-15 NOTE — Progress Notes (Signed)
Renal Navigator called patient's mother to follow up. She is agreeable to patient's return to [place] and schedule moved to first shift TTS. She is appreciative. Navigator to follow up with [facility] clinic to confirm schedule change.  Alphonzo Cruise, [place] Renal Navigator [phone]

## 2020-02-15 NOTE — Progress Notes (Signed)
Pt noted very restless towards end of HD tx. Pt noted attempting to get out of bed unassisted, observed leaning against siderail with legs off of bed. Upon attempt to place legs in bed, blood noted coming from fistula site, blood noted on bed and patient's clothes. Area redressed and patient cleaned up. No adverse effects noted. Vital signs stable. Pt irritable at this time, stating "all I need to go is out of this bed!". Pt encouraged to stay in bed until returning to room, pt compliant at this time. Staff at bedside.

## 2020-02-15 NOTE — Progress Notes (Signed)
Social Work Patient ID: [name] [name], male   DOB: January 05, 1966, 54 y.o.   MRN: [id number]   02/14/2020- Per medical team, [facility] d/c on 02/21/2020.  02/15/2020-SW called pt mother [name] [name] to discuss DME recommendations (w/c, RW) and HHPT/OT/ST. Pt has access to RW if there is a cost.  SW did inform on Medicaid transportation referral made. SW encouraged her to complete with pt once he is discharged from hospital. Family will be able to provide 24/7 care. SW inquired if she has spoken to patient's son [name]. No updates at this time if he will be assisting.  *SW followed up with [facility] Navigator to confirm pt d/c date. Reports pt and pt mother have been encouraged to explore changing dialysis sites once he discharges from the hospital. SW met with pt in room to discuss above. Pt was in/out of sleep during conversation. Pt aware SW will order: w/c and RW. SW unsure on what information pt retained.   Loralee Pacas, MSW, [place] Office: [phone] Cell: [phone] Fax: [phone]

## 2020-02-15 NOTE — Progress Notes (Signed)
[place] PHYSICAL MEDICINE & REHABILITATION PROGRESS NOTE  Subjective/Complaints: [name] [name] continues to complain of pain in his bilateral feet, does not feel that this is fully controlled by pain medications.  Slept very well last night. Moving bowels regularly.   ROS: Patient denies fever, rash, sore throat, blurred vision, nausea, vomiting, diarrhea, cough, shortness of breath or chest pain,  Headache .    Objective: Vital Signs: Blood pressure 105/81, pulse 95, temperature 97.8 F (36.6 C), temperature source Oral, resp. rate 18, height 5' 10.5" (1.791 m), weight 64.1 kg, SpO2 95 %. No results found. Recent Labs    02/13/20 0847  WBC 6.8  HGB 12.3*  HCT 40.8  PLT 196   Recent Labs    02/13/20 0847 02/14/20 1127  NA 133* 136  K 4.8 4.1  CL 91* 92*  CO2 24 26  GLUCOSE 116* 127*  BUN 124* 77*  CREATININE 7.06* 5.34*  CALCIUM 8.6* 8.7*    Physical Exam: BP 105/81   Pulse 95   Temp 97.8 F (36.6 C) (Oral)   Resp 18   Ht 5' 10.5" (1.791 m)   Wt 64.1 kg   SpO2 95%   BMI 19.99 kg/m  Constitutional: No distress . Vital signs reviewed. Sitting up in bed comfortably.  HEENT: EOMI, oral membranes moist Neck: supple Cardiovascular: RRR without murmur. No JVD    Respiratory/Chest: CTA Bilaterally without wheezes or rales. Normal effort    GI/Abdomen: BS +, non-tender, non-distended Ext: no clubbing, tr LE edema Skin:   Bilateral toes gangrenous, right more involved than left, esp great toe--stable appearance Psych: fairly engaging today Musc: No edema in extremities.  No tenderness in extremities. Neurological: alert, dysarthric speech. Processing better. Better awareness Motor: 3-4 -/5 moves all 4's.    Assessment/Plan: 1. Functional deficits secondary to encephalopathy which require 3+ hours per day of interdisciplinary therapy in a comprehensive inpatient rehab setting.  Physiatrist is providing close team supervision and 24 hour management of active  medical problems listed below.  Physiatrist and rehab team continue to assess barriers to discharge/monitor patient progress toward functional and medical goals  Care Tool:  Bathing  Bathing activity did not occur: Refused Body parts bathed by patient: Buttocks, Front perineal area   Body parts bathed by helper: Abdomen, Right lower leg, Left lower leg     Bathing assist Assist Level: Supervision/Verbal cueing     Upper Body Dressing/Undressing Upper body dressing Upper body dressing/undressing activity did not occur (including orthotics): N/A What is the patient wearing?: Pull over shirt    Upper body assist Assist Level: Moderate Assistance - Patient 50 - 74%    Lower Body Dressing/Undressing Lower body dressing    Lower body dressing activity did not occur: N/A What is the patient wearing?: Underwear/pull up, Pants     Lower body assist Assist for lower body dressing: Moderate Assistance - Patient 50 - 74%     Toileting Toileting    Toileting assist Assist for toileting: Minimal Assistance - Patient > 75%     Transfers Chair/bed transfer  Transfers assist     Chair/bed transfer assist level: Contact Guard/Touching assist     Locomotion Ambulation   Ambulation assist   Ambulation activity did not occur: N/A  Assist level: Contact Guard/Touching assist Assistive device: Walker-rolling Max distance: 100 ft   Walk 10 feet activity   Assist     Assist level: Contact Guard/Touching assist Assistive device: Walker-rolling   Walk 50 feet activity  Assist    Assist level: Contact Guard/Touching assist Assistive device: Walker-rolling    Walk 150 feet activity   Assist Walk 150 feet activity did not occur: Safety/medical concerns         Walk 10 feet on uneven surface  activity   Assist Walk 10 feet on uneven surfaces activity did not occur: Safety/medical concerns         Wheelchair     Assist Will patient use wheelchair  at discharge?: Yes Type of Wheelchair: Manual Wheelchair activity did not occur: N/A  Wheelchair assist level: Supervision/Verbal cueing, Maximal Assistance - Patient 25 - 49% Max wheelchair distance: 75 ft    Wheelchair 50 feet with 2 turns activity    Assist        Assist Level: Supervision/Verbal cueing, Maximal Assistance - Patient 25 - 49%   Wheelchair 150 feet activity     Assist     Assist Level: Moderate Assistance - Patient 50 - 74%      Medical Problem List and Plan: 1.  Deficits with mobility, endurance, self-care secondary to encephalopathy with debility, PVD with gangrenous toes.  -Continue CIR therapies including PT, OT, and SLP   -pt with baseline cognitive deficits  -ELOS 3/9 2.  Antithrombotics: -PE/anticoagulation:  Pharmaceutical: Other (comment)--on low dose Eliquis             -antiplatelet therapy:  N/A 3. Pain Management: Oxycodone or tramadol prn.   2/24: Therapist notes that [name] [name] has been having hypersensitivity of his gangrenous toes. Will add low dose Gabapentin 100mg  TID to help with pain-- low dose due to ESRD.  2/28 continue gabapentin---seems to be tolerating so far  3/1: Pain is well controlled.   3/3: Complains of continued burning pain in toes, persistent despite medication. Will increase Gabapentin to 400mg  TID. Creatinine decreased on labs today.  4. Mood: LCSW to follow for evaluation and support.    See #14             -antipsychotic agents: N/A   See #14 5. Neuropsych: This patient is not fully capable of making decisions on his own behalf. 6. Skin/Wound Care: Dry dressing to bilateral feet.              stoma closing. PEG site intact 7. Fluids/Electrolytes/Nutrition:   2/23 stopped TF  -monitor intake.   8. A fib/NSVT: On amiodarone and metoprolol for rate control.              Rate in 90's on 2/25   -isolated bradycardia yesterday   3/2--3/3: rates remain in 90's   -see #9.   Monitor with increased  activity.  -intermittent SOB, CXR demonstrates CHF. Nephrology working on volume mgt with HD 9.  ESRD: HD MWF at the end of the day to help with activity tolerance.   -volume/weight mgt per nephrology, weights down  3/2 may need to reduce volume taken off in HD [facility] Weights   02/13/20 1636 02/14/20 0325 02/15/20 0348  Weight: 63.6 kg 63.8 kg 64.1 kg  10. Pericardial effusion: Has been on colchicine since 1/19 and has been weaned to prednisone 10 mg daily. Last echo 1/29 shows decrease to small effusion. 11. Hypotension: Continue midodrine 10 mg tid.  BP's still soft 2/26, 3/1, 3/3 12. Dysphagia:   Eating fairly well now  2/26- had MBS today---upgrade to regular/thins 13. Dry gangrene bilateral feet: Needs to wear shoes for protection. Dry dressing. Maintain adequate nutritional status.    -he is  having ongoing pain in feet, right more than left.   -both feet cool, especially right  -wounds appear stable to improved but demonstrate ongoing gangrene as well  3/2 -vascular following patient. May need toe amps eventually  -continue local care, pain mgt 14. Anxiety/depression: On Zoloft 25 mg/day.  Team support.  ECG with prolonged QTC 512  Continue melatonin  Klonopin increased to 1 mg 3 times daily ---dc bedtime dose-likely lethargic partly d/t this---avoid excessive use 15. Insomnia: Per father, he sleeps late at night at baseline.   -2/27 still having difficulties more often than not at night d/t confusion/agitation/anxiety   -resumed seroquel 25mg  qhs   -QTC actually was DECREASED last week after giving seroquel the other day.       3/2 sleep generally improved   -has baseline insomnia   -pain and anxiety often inhibit sleep   -QTC further decreased on today's EKG which I reviewed  LOS: 12 days A FACE TO FACE EVALUATION WAS PERFORMED  [name] [name] P [name] [name] 02/15/2020, 9:25 AM

## 2020-02-15 NOTE — Progress Notes (Addendum)
[facility] Progress Note   Subjective:   Pt seen in room. Reports ongoing foot pain. Denies SOB, CP, palpitations, abdominal pain, N/V/D. Requests shorter HD with lower UF goal.   Objective Vitals:   02/14/20 1745 02/14/20 1932 02/15/20 0348 02/15/20 0824  BP: 108/79 101/74 (!) 114/95 105/81  Pulse: 96 63 97 95  Resp:   18   Temp:  98.5 F (36.9 C) 97.8 F (36.6 C)   TempSrc:   Oral   SpO2: 97% 98% 95%   Weight:   64.1 kg   Height:       Physical Exam General: Well developed male, sleeping but rouses to voice, NAD Heart: RRR, no murmurs, rubs or gallops Lungs: CTA bilaterally without wheezing, rhonchi or rales Abdomen: Non-tender, +BS, +PEG Extremities: No edema bilateral lower extremities, feet in boots Dialysis Access: L AVF + thrill  Additional Objective Labs: Basic Metabolic Panel: Recent Labs  Lab 02/10/20 1930 02/13/20 0847 02/14/20 1127  NA 138 133* 136  K 4.3 4.8 4.1  CL 94* 91* 92*  CO2 26 24 26   GLUCOSE 130* 116* 127*  BUN 26* 124* 77*  CREATININE 3.09* 7.06* 5.34*  CALCIUM 8.8* 8.6* 8.7*  PHOS 2.1* 2.7 3.6   Liver Function Tests: Recent Labs  Lab 02/10/20 1930 02/13/20 0847 02/14/20 1127  ALBUMIN 3.9 3.6 3.5   No results for input(s): LIPASE, AMYLASE in the last 168 hours. CBC: Recent Labs  Lab 02/08/20 1254 02/08/20 1254 02/10/20 1149 02/10/20 1836 02/13/20 0847  WBC 10.2   < > 6.9 5.4 6.8  HGB 12.3*   < > 12.3* 13.1 12.3*  HCT 40.4   < > 41.1 43.8 40.8  MCV 89.4  --  91.3 92.0 90.3  PLT 143*   < > 167 154 196   < > = values in this interval not displayed.   Blood Culture    Component Value Date/Time   SDES BLOOD RIGHT HAND 12/17/2019 1520   SPECREQUEST  12/17/2019 1520    BOTTLES DRAWN AEROBIC ONLY Blood Culture results may not be optimal due to an inadequate volume of blood received in culture bottles   CULT  12/17/2019 1520    NO GROWTH 5 DAYS Performed at [facility], [place] [address],  [place], [place] [address]    REPTSTATUS 12/22/2019 FINAL 12/17/2019 1520    Medications:  . amiodarone  200 mg Oral Daily  . apixaban  2.5 mg Oral BID  . ascorbic acid  500 mg Oral BID  . benztropine  1 mg Oral BID  . Chlorhexidine Gluconate Cloth  6 each Topical Q0600  . clonazePAM  1 mg Oral TID WC  . colchicine  0.6 mg Oral Daily  . feeding supplement (PRO-STAT SUGAR FREE 64)  30 mL Oral BID  . folic acid  1 mg Oral Daily  . free water  100 mL Per Tube BID  . gabapentin  100 mg Oral Q8H  . levothyroxine  25 mcg Oral Q0600  . mouth rinse  15 mL Mouth Rinse BID  . Melatonin  3 mg Oral QHS  . metoprolol tartrate  25 mg Oral TID WC  . midodrine  10 mg Oral TID WC  . multivitamin  1 tablet Oral QHS  . predniSONE  10 mg Oral Q breakfast  . QUEtiapine  25 mg Oral QHS  . thiamine  100 mg Oral Daily    Dialysis Orders: Prev orders - will need to change significantly on  d/c MWF at [facility], 4:15hr, EDW 74.5kg, 2K/2.5Ca, AVF, heparin 5K - Calcitriol 0.40mcg PO q HD. No ESA or venofer.  Assessment/Plan: 1.Deconditioning: S/p prolonged [facility] admit (12/2-12/23/19) s/p cardiac arrest, then [facility] until 2/21. Now CIR. 2. ESRD:Continue HD per MWF schedule-next 3/3. K+ controlled. Cr/BUN improved today, requests 3.5hr treatemtn. 3. HTN/volume:BPlow after last HD with possible syncopal episode, currently on metoprololfor rate control+ midodrine.CXR 2/24and 2/28withpersistent pulm edema and patient still on O2. 3L off with HD yesterday but BP not tolerating, will aim for 1.5L today.   4. Anemia:Hgb12.3.- no ESA needed. 5. Secondary hyperparathyroidism:Caat goa, phos 3.6. No binders/VDRA. 6. Hx cardiac arrest  7. Hx PE: On Eliquis 8. A-fib: On Eliquis + amiodarone 9. Hx pericardial effusion ->colchicine +prednisone 10mg  QD. 10. Anxiety + lethargy: Meds per primary. 11. HFrEF (<20%) 12. Hx resp failure with vent/trach: Trach decannulated/healed 13. Toe gangrene: S/p  prolonged pressor use/ischemic changes to BLE. On gabapentin 100mg  TID which is an appropriate renal dose, caution using higher doses.   Anice Paganini, PA-C 02/15/2020, 11:04 AM  [facility] Pager: [phone]  Nephrology attending: Patient was seen and examined at bedside.  Chart and lab results reviewed.  I agree with assessment and plan as outlined above.  ESRD on HD MWF with prolonged hospitalization after cardiac arrest now getting inpatient rehab.  Plan for HD today.  We will reduce UF goal to 1.5 kg as he became hypotensive last time and his volume status looks okay.  Patient requesting to decrease the treatment time.  Katheran James, MD [facility].

## 2020-02-15 NOTE — Progress Notes (Addendum)
Patient agitated upon arrival from dialysis, walking around the room wanting to get dressed and upset about not having a clean shirt. Writer was with patient for several minutes monitoring him up and about. Patient sat in his chair and allowed this writer to do foot measurements, but was not allowed to touch his feet. All measurements are approximated because of this. Patient also refused treatment to bilateral feet this eveing.   Left foot wounds measure - left great toe (under side of great toe) 4cm X3cm, beefy red wound bed with black eschar to top of toe. Under side of 2nd toe left foot- 1cm X 1.5cm- beefy red wound bed, surrounding skin non blanchable.  Under 3rd toe 0.4cm X 1.5cm, wound bed is beefy red, surrounding skin nonblanchable  Right foot great toe-( under side of great toe) 1.5cm X 1cm, wound bed is beefy red, top is black eschar   Right 2nd toe under side, 1cm X 1cm, wound bed is beefy red, surrounding skin nonblanchable  Right 5th toe underside 2cm X 3 Cm beefy red wound bed surrounding most of the toe

## 2020-02-16 ENCOUNTER — Inpatient Hospital Stay (HOSPITAL_COMMUNITY): Payer: Medicare Other | Admitting: Speech Pathology

## 2020-02-16 ENCOUNTER — Inpatient Hospital Stay (HOSPITAL_COMMUNITY): Payer: Medicare Other | Admitting: Occupational Therapy

## 2020-02-16 ENCOUNTER — Inpatient Hospital Stay (HOSPITAL_COMMUNITY): Payer: Medicare Other

## 2020-02-16 MED ORDER — CHLORHEXIDINE GLUCONATE CLOTH 2 % EX PADS
6.0000 | MEDICATED_PAD | Freq: Every day | CUTANEOUS | Status: DC
  Administered 2020-02-16: 6 via TOPICAL

## 2020-02-16 NOTE — Progress Notes (Signed)
Physical Therapy Session Note  Patient Details  Name: [name] [name] MRN: [id number] Date of Birth: March 05, 1966  Today's Date: 02/16/2020 PT Individual Time: 1400-1500 PT Individual Time Calculation (min): 60 min   Short Term Goals: Week 1:  PT Short Term Goal 1 (Week 1): Pt will transfer to and from [facility] with min assist. PT Short Term Goal 1 - Progress (Week 1): Met PT Short Term Goal 2 (Week 1): Pt will ambulate 46f with min assist and RW PT Short Term Goal 2 - Progress (Week 1): Progressing toward goal PT Short Term Goal 3 (Week 1): Pt will attend to task >5 mintues with only min cues from PT PT Short Term Goal 3 - Progress (Week 1): Met PT Short Term Goal 4 (Week 1): Pt will performed bed mobility with CGA PT Short Term Goal 4 - Progress (Week 1): Met Week 2:  PT Short Term Goal 1 (Week 2): Pt will negotiate 4 steps with B rails with min assist. PT Short Term Goal 1 - Progress (Week 2): Met PT Short Term Goal 2 (Week 2): Pt will complete car transfer with LRAD & min assist. PT Short Term Goal 2 - Progress (Week 2): Met PT Short Term Goal 3 (Week 2): Pt will ambulate 100 ft with LRAD & min assist. PT Short Term Goal 3 - Progress (Week 2): Met Week 3:  PT Short Term Goal 1 (Week 3): STG = LTG due to estimated d/c date.  Skilled Therapeutic Interventions/Progress Updates:    PAIN Pt initially OOB in recliner and agreeable to treatment session with focus on  Balance, endurance, problem solving, global strength.  Pt wearing Darco shoes.    Pt requested to donn clean shirt prior to session.  Pt able to remove jacket and donn clean shirt w/set up only.  STS from recliner w/cga, SPT to wc w/CGA w/RW, additional time. wc propulsion x944fw/bilat UE's w/min assist to negotiate obstacles, very slow progression. STS from wc w/cga, cues.  Stood at bedside table and performed pipe puzzles of increasing complexity, needed pieces provided by therapist before each puzzle.    Pt stood 2-4 min x 5  sets w/cga to min assist required w/fatigue while completing each task.  Performed for LE strengthening and endurance, global strengthening  Required 2-3 min recovery between each standing effort and falls asleep during break.  HR 88-94, 02sats 92-97% on 2L02.    wc propulsion x 2524f/bilat UEs and min assist. STS from wc and gait x30f20f room w/cga, slow cadence, turn/sit to recliner w/cga and cues for hand positiioning to recliner to control descent due to decreased eccentric control.  Pt requesting graham cracker snack, cleared w/nursing and provided to pt.   Pt left oob in recliner, father present w/chair alarm set and needs in reach.    Therapy Documentation Precautions:  Precautions Precautions: Fall Precaution Comments: Peg, Darco shoes Restrictions Weight Bearing Restrictions: No   Therapy/Group: Individual Therapy  BarbCallie Fielding  McGregor/2021, 3:12 PM

## 2020-02-16 NOTE — Progress Notes (Signed)
Speech Language Pathology Weekly Progress and Session Note  Patient Details  Name: [name] [name] MRN: [id number] Date of Birth: Jul 22, 1966  Beginning of progress report period: February 10, 2020 End of progress report period: February 17, 2020  Today's Date: 02/16/2020 SLP Individual Time: 0034-9179 SLP Individual Time Calculation (min): 55 min  Short Term Goals: Week 2: SLP Short Term Goal 1 (Week 2): Patient will consume current diet with minimal overt s/s of aspiration with Mod I for use of swallowing compensatory strategies. SLP Short Term Goal 1 - Progress (Week 2): Met SLP Short Term Goal 2 (Week 2): Patient wil utilize an increased vocal intensity at the phrase level to achieve ~75% intelligibility with Min A verbal cues. SLP Short Term Goal 2 - Progress (Week 2): Met SLP Short Term Goal 3 (Week 2): Patient will recall new, daily information with Min verbal cues for use of compensatory strategies. SLP Short Term Goal 3 - Progress (Week 2): Not met SLP Short Term Goal 5 (Week 2): Patient will demonstrate sustained attention to functional tasks for 45 minutes with supevision verbal cues for redirection. SLP Short Term Goal 5 - Progress (Week 2): Met    New Short Term Goals: Week 3: SLP Short Term Goal 1 (Week 3): STGs=LTGs due ELOS  Weekly Progress Updates: Patient has made functional gains and has met 3 of 4 STGs this reporting period. Currently, patient is consuming regular textures with thin liquids with minimal overt s/s of aspiration and overall Mod I for use of swallowing compensatory strategies. Patient requires overall Min A verbal cues for sustained attention which is exacerbated by fatigue at times and basic problem solving. Increased cueing is needed for recall of functional information, even though patient has baseline memory deficits, suspect deficits are exacerbated by fatigue and decreased attention/arousal with tasks. Patient also demonstrates improved vocal intensity and  overall speech intelligibility but requires overall Min A for consistent use of intelligibility strategies. Patient and family education ongoing. Patient would benefit from continued skilled SLP intervention to maximize his cognitive and speech function prior to discharge.      Intensity: Minumum of 1-2 x/day, 30 to 90 minutes Frequency: 3 to 5 out of 7 days Duration/Length of Stay: 02/21/20 Treatment/Interventions: Cognitive remediation/compensation;Dysphagia/aspiration precaution training;Speech/Language facilitation;Internal/external aids;Cueing hierarchy;Environmental controls;Therapeutic Activities;Patient/family education;Functional tasks   Daily Session  Skilled Therapeutic Interventions:  Skilled treatment session focused on cognitive goals. SLP facilitated session by providing Min-Mod A verbal cues for recall and complex problem solving during a novel card task. Patient demonstrated selective attention to task for ~45 minutes with overall Mod I. Patient left upright in recliner with alarm on and all needs within reach. Continue with current plan of care.      Pain Pain Assessment Pain Scale: 0-10 Pain Score: Asleep Pain Type: Chronic pain;Neuropathic pain;Intractable pain Pain Location: Foot Pain Orientation: Right;Left Pain Descriptors / Indicators: Aching;Grimacing;Guarding;Restless;Stabbing Pain Frequency: Constant Pain Onset: On-going Patients Stated Pain Goal: 2 Pain Intervention(s): Medication (See eMAR)  Therapy/Group: Individual Therapy  Mistina Coatney 02/16/2020, 6:46 AM

## 2020-02-16 NOTE — Progress Notes (Signed)
Social Work Patient ID: [name], male   DOB: 01-Jun-1966, 54 y.o.   MRN: [id number]    SW followed up with [facility] Navigator to discuss note on 02/16/20 with regard to change in dialysis time to T/TR/S at 7:10am. States unsure if pt will have dialysis on date of discharge and will know more on Monday. Reports schedule left for pt. SW updated medical team.   SW met with pt and pt dad in room to discuss HHA preference. Pt was leaving for therapy when SW arrived. SW talked with dad about change in pt dialysis schedule. He is more than willing to continue to assist pt with his care needs until he gets stronger. Intends to follow up with Medicaid transportation to see if they are able to help. SW informed on ordering DME for pt, and that pt will more than likely d/c with home o2. SW encouraged follow-up if needed.   DME recs: w/c and RW. SW sent order to [place] via parachute.    Loralee Pacas, MSW, [place] Office: [phone] Cell: [phone] Fax: [phone]

## 2020-02-16 NOTE — Progress Notes (Signed)
Pt is refusing wound care, assistance with care, and is non-compliant with safety precautions. This nurse and the NTs made multiple attempts throughout the shift to discuss pt care without cooperation from the pt.

## 2020-02-16 NOTE — Progress Notes (Addendum)
This nurse spoke top Pam PA regarding patient BP of 82/71 and heart rate per dinamap of 110. Per Pam recheck heart rate manually and hold lopressor at this time. Apical pulse 97 . Checked manually.

## 2020-02-16 NOTE — Progress Notes (Addendum)
[facility] Progress Note   Subjective:  Patient seen in room, sitting in chair. Denies SOB, cough, CP, palpitations, dizziness, abdominal pain, N/V/D. Agrees to trial HD in recliner tomorrow in preparation for expected discharge next week.    Objective Vitals:   02/15/20 1830 02/15/20 1934 02/16/20 0352 02/16/20 0904  BP: 94/67 96/75 90/70  92/73  Pulse: 96 94 99 96  Resp:  18 20   Temp:  97.6 F (36.4 C) 97.8 F (36.6 C)   TempSrc:  Oral Axillary   SpO2:  98% 96%   Weight:   65.1 kg   Height:       Physical Exam General: Well developed male, alert and in NAD Heart: RRR, no murmurs, rubs or gallops Lungs: CTA bilaterally without wheezing, rhonchi or rales Abdomen: Non-tender, +BS, +PEG Extremities: No edema bilateral lower extremities, feet in boots Dialysis Access: L AVF + thrill   Additional Objective Labs: Basic Metabolic Panel: Recent Labs  Lab 02/13/20 0847 02/14/20 1127 02/15/20 1310  NA 133* 136 133*  K 4.8 4.1 4.9  CL 91* 92* 91*  CO2 24 26 23   GLUCOSE 116* 127* 112*  BUN 124* 77* 112*  CREATININE 7.06* 5.34* 6.75*  CALCIUM 8.6* 8.7* 8.7*  PHOS 2.7 3.6 4.9*   Liver Function Tests: Recent Labs  Lab 02/13/20 0847 02/14/20 1127 02/15/20 1310  ALBUMIN 3.6 3.5 3.6   No results for input(s): LIPASE, AMYLASE in the last 168 hours. CBC: Recent Labs  Lab 02/10/20 1149 02/10/20 1149 02/10/20 1836 02/13/20 0847 02/15/20 1311  WBC 6.9   < > 5.4 6.8 7.4  HGB 12.3*   < > 13.1 12.3* 12.4*  HCT 41.1   < > 43.8 40.8 40.6  MCV 91.3  --  92.0 90.3 88.6  PLT 167   < > 154 196 229   < > = values in this interval not displayed.   Blood Culture    Component Value Date/Time   SDES BLOOD RIGHT HAND 12/17/2019 1520   SPECREQUEST  12/17/2019 1520    BOTTLES DRAWN AEROBIC ONLY Blood Culture results may not be optimal due to an inadequate volume of blood received in culture bottles   CULT  12/17/2019 1520    NO GROWTH 5 DAYS Performed at  [facility], [place] [address], [place], [place] [address]    REPTSTATUS 12/22/2019 FINAL 12/17/2019 1520    Medications:  . amiodarone  200 mg Oral Daily  . apixaban  2.5 mg Oral BID  . ascorbic acid  500 mg Oral BID  . benztropine  1 mg Oral BID  . Chlorhexidine Gluconate Cloth  6 each Topical Q0600  . clonazePAM  1 mg Oral TID WC  . colchicine  0.6 mg Oral Daily  . feeding supplement (PRO-STAT SUGAR FREE 64)  30 mL Oral BID  . folic acid  1 mg Oral Daily  . free water  100 mL Per Tube BID  . gabapentin  100 mg Oral Q8H  . levothyroxine  25 mcg Oral Q0600  . mouth rinse  15 mL Mouth Rinse BID  . Melatonin  3 mg Oral QHS  . metoprolol tartrate  25 mg Oral TID WC  . midodrine  10 mg Oral TID WC  . multivitamin  1 tablet Oral QHS  . predniSONE  10 mg Oral Q breakfast  . QUEtiapine  25 mg Oral QHS  . thiamine  100 mg Oral Daily    Dialysis Orders: Prev orders -  will need to change significantly on d/c MWF at [facility], 4:15hr, EDW 74.5kg, 2K/2.5Ca, AVF, heparin 5K - Calcitriol 0.43mcg PO q HD. No ESA or venofer.  Assessment/Plan: 1. Deconditioning: S/p prolonged [facility] admit (12/2-12/23/19) s/p cardiac arrest, then [facility] until 2/21. Now CIR. 2. ESRD:Continue HD per MWF schedule but will be changing to TTS at discharge, may require HD prior to d/c on 3/9 depending on labs-next3/5. K+ controlled.  3. HTN/volume:BPlow, currently on metoprololfor rate control+ midodrine.CXR 2/24and 2/28withpersistent pulm edemaand patient still on O2. BP dropping with HD despite extra midodrine dose, limiting UF.  4. Anemia:Hgb12.4.- no ESA needed. 5. Secondary hyperparathyroidism:Caat goal, phos 4.9. No binders/VDRA. 6. Hx cardiac arrest  7. Hx PE: On Eliquis 8. A-fib: On Eliquis + amiodarone 9. Hx pericardial effusion:colchicine +prednisone 10mg  QD. 10. Anxiety + lethargy: Meds per primary. 11. HFrEF (<20%) 12. 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 On gabapentin 100mg  TID which is an appropriate renal dose, caution using higher doses.    Anice Paganini, PA-C 02/16/2020, 10:28 AM  [facility] Pager: [phone]  Nephrology attending: Patient was seen and examined at bedside.  Chart and lab results reviewed.  I agree with assessment and plan as outlined above.  ESRD on HD MWF with prolonged hospitalization after cardiac arrest now getting inpatient rehab.  Plan for HD tomorrow and then will need to switch back to TTS schedule for discharge planning.  Katheran James, MD [facility].

## 2020-02-16 NOTE — Progress Notes (Signed)
Renal Navigator spoke with [name] who states patient's schedule has been changed to TTS 7:30am, with a 7:10am arrival time.  Renal Navigator will provide new schedule letter to patient. With discharge planned for 02/21/20, patient will return to OP HD clinic on Thursday, 02/23/20. Clinic notified and Navigator will confirm next week at patient's discharge.  [name] [name], [name] [phone]

## 2020-02-16 NOTE — Progress Notes (Signed)
Occupational Therapy Weekly Progress Note  Patient Details  Name: [name] [name] MRN: [id number] Date of Birth: 11/23/1966  Beginning of progress report period: February 04, 2020 End of progress report period: February 16, 2020  Today's Date: 02/16/2020 OT Individual Time: 0734-0831 OT Individual Time Calculation (min): 57 min   Patient has met 3 of 3 short term goals.  Pt is making steady progress towards OT goals. PT is at an overall supervision/CGA level for BADL Tasks. PT continues to be limited by endurance, but this has greatly improved over the week. Continue current POC.  Patient continues to demonstrate the following deficits: muscle weakness, decreased cardiorespiratoy endurance and decreased oxygen support, decreased initiation, decreased attention, decreased awareness, decreased problem solving and decreased safety awareness and decreased sitting balance, decreased standing balance, decreased postural control and decreased balance strategies and therefore will continue to benefit from skilled OT intervention to enhance overall performance with BADL and Reduce care partner burden.  Patient progressing toward long term goals..  Continue plan of care.  OT Short Term Goals Week 2:  OT Short Term Goal 1 (Week 2): Pt will complete sit<>stand with no more than CGA within BADL tasks OT Short Term Goal 1 - Progress (Week 2): Met OT Short Term Goal 2 (Week 2): Pt will complete LB dressing with no more than min A OT Short Term Goal 2 - Progress (Week 2): Met OT Short Term Goal 3 (Week 2): Pt will don DARCO boots with no more than min A OT Short Term Goal 3 - Progress (Week 2): Met Week 3:  OT Short Term Goal 1 (Week 3): LTG=STG 2/2 ELOS  Skilled Therapeutic Interventions/Progress Updates:    Pt greeted seated in recliner finishing breakfast and agreeable to OT treatment session. OT offered pt to shower, but he refused. OT discussed activity tolerance needed for standing showering and high risk  of falls in shower with recommendation from OT to sit down on shower seat of BSC when showering at home. Pt stated " I am fine, showering is easy, I don't need to sit." OT encouraged pt to practice showering here in hospital to prepare for home dc but he refused. OT also educated on possibility that B LEs may need to be covered for shower 2/2 necrosis and he may have to sit down. Pt did not want to walk more about showering and became frustrated with OT recommendations. Pt agreeable to bushed teeth and washing face at the sink. Pt needed increased time and min verbal cues to don Darco shoes. Pt then ambulated to the sink w RW and close supervision. Pt tolerated standing for toothbrushing and facewashing task with close supervision and intermittent CGA for mild posterior LOBs. Pt took seated rest break, then ambulated to therapy gym w/ close supervision and 2L of O2. UB strength and endurance with 5 mins x2 on [employer] arm bike. OT played pt preferred Jazz music during there-ex. Pt then ambulated back to room in similar fashion and left seated in recliner with alarm belt on, call bell in reach, and needs met.  Therapy Documentation Precautions:  Precautions Precautions: Fall Precaution Comments: Peg, Darco shoes Restrictions Weight Bearing Restrictions: No Pain: Pain Assessment Pain Score: Asleep   Therapy/Group: Individual Therapy   S  02/16/2020, 8:15 AM   

## 2020-02-16 NOTE — Plan of Care (Signed)
  Problem: Consults Goal: RH GENERAL PATIENT EDUCATION Description: See Patient Education module for education specifics. Outcome: Progressing   Problem: RH BOWEL ELIMINATION Goal: RH STG MANAGE BOWEL WITH ASSISTANCE Description: STG Manage Bowel with Assistance. mod Outcome: Progressing   Problem: RH SKIN INTEGRITY Goal: RH STG SKIN FREE OF INFECTION/BREAKDOWN Description: Free of breakdown and infection with mod assist Outcome: Progressing Goal: RH STG ABLE TO PERFORM INCISION/WOUND CARE W/ASSISTANCE Description: STG Able To Perform Incision/Wound Care With Assistance. Mod Outcome: Progressing   Problem: RH SAFETY Goal: RH STG ADHERE TO SAFETY PRECAUTIONS W/ASSISTANCE/DEVICE Description: STG Adhere to Safety Precautions With Assistance/Device. Mod Outcome: Progressing   Problem: RH PAIN MANAGEMENT Goal: RH STG PAIN MANAGED AT OR BELOW PT'S PAIN GOAL Description: Less than 4 Outcome: Progressing

## 2020-02-16 NOTE — Progress Notes (Signed)
[place] PHYSICAL MEDICINE & REHABILITATION PROGRESS NOTE  Subjective/Complaints: Ongoing foot/toe pain. Pain meds help. Slept pretty well last night  ROS: Patient denies fever, rash, sore throat, blurred vision, nausea, vomiting, diarrhea, cough, shortness of breath or chest pain , headache, or mood change.   Objective: Vital Signs: Blood pressure 92/73, pulse 96, temperature 97.8 F (36.6 C), temperature source Axillary, resp. rate 20, height 5' 10.5" (1.791 m), weight 65.1 kg, SpO2 96 %. No results found. Recent Labs    02/15/20 1311  WBC 7.4  HGB 12.4*  HCT 40.6  PLT 229   Recent Labs    02/14/20 1127 02/15/20 1310  NA 136 133*  K 4.1 4.9  CL 92* 91*  CO2 26 23  GLUCOSE 127* 112*  BUN 77* 112*  CREATININE 5.34* 6.75*  CALCIUM 8.7* 8.7*    Physical Exam: BP 92/73 (BP Location: Right Arm)   Pulse 96   Temp 97.8 F (36.6 C) (Axillary)   Resp 20   Ht 5' 10.5" (1.791 m)   Wt 65.1 kg   SpO2 96%   BMI 20.30 kg/m  Constitutional: No distress . Vital signs reviewed. HEENT: EOMI, oral membranes moist Neck: supple Cardiovascular: RRR without murmur. No JVD    Respiratory/Chest: CTA Bilaterally without wheezes or rales. Normal effort    GI/Abdomen: BS +, non-tender, non-distended Ext: no clubbing, cyanosis  Skin:   Bilateral toes gangrenous, right more involved than left, esp great toe still. No changes. Sl edema in feet Psych: flat but engages Musc: No edema in extremities.  No tenderness in extremities. Neurological: alert, dysarthric speech. Processing better. Better awareness Motor: 3-4 -/5 moves all 4's.    Assessment/Plan: 1. Functional deficits secondary to encephalopathy which require 3+ hours per day of interdisciplinary therapy in a comprehensive inpatient rehab setting.  Physiatrist is providing close team supervision and 24 hour management of active medical problems listed below.  Physiatrist and rehab team continue to assess barriers to  discharge/monitor patient progress toward functional and medical goals  Care Tool:  Bathing  Bathing activity did not occur: Refused Body parts bathed by patient: Buttocks, Front perineal area   Body parts bathed by helper: Abdomen, Right lower leg, Left lower leg     Bathing assist Assist Level: Supervision/Verbal cueing     Upper Body Dressing/Undressing Upper body dressing Upper body dressing/undressing activity did not occur (including orthotics): N/A What is the patient wearing?: Pull over shirt    Upper body assist Assist Level: Moderate Assistance - Patient 50 - 74%    Lower Body Dressing/Undressing Lower body dressing    Lower body dressing activity did not occur: N/A What is the patient wearing?: Underwear/pull up, Pants     Lower body assist Assist for lower body dressing: Moderate Assistance - Patient 50 - 74%     Toileting Toileting    Toileting assist Assist for toileting: Minimal Assistance - Patient > 75%     Transfers Chair/bed transfer  Transfers assist     Chair/bed transfer assist level: Contact Guard/Touching assist     Locomotion Ambulation   Ambulation assist   Ambulation activity did not occur: N/A  Assist level: Contact Guard/Touching assist Assistive device: Walker-rolling Max distance: 100 ft   Walk 10 feet activity   Assist     Assist level: Contact Guard/Touching assist Assistive device: Walker-rolling   Walk 50 feet activity   Assist    Assist level: Contact Guard/Touching assist Assistive device: Walker-rolling    Walk 150  feet activity   Assist Walk 150 feet activity did not occur: Safety/medical concerns         Walk 10 feet on uneven surface  activity   Assist Walk 10 feet on uneven surfaces activity did not occur: Safety/medical concerns         Wheelchair     Assist Will patient use wheelchair at discharge?: Yes Type of Wheelchair: Manual Wheelchair activity did not occur:  N/A  Wheelchair assist level: Minimal Assistance - Patient > 75% Max wheelchair distance: 150 ft    Wheelchair 50 feet with 2 turns activity    Assist        Assist Level: Minimal Assistance - Patient > 75%   Wheelchair 150 feet activity     Assist     Assist Level: Minimal Assistance - Patient > 75%      Medical Problem List and Plan: 1.  Deficits with mobility, endurance, self-care secondary to encephalopathy with debility, PVD with gangrenous toes.  -Continue CIR therapies including PT, OT, and SLP   -pt with baseline cognitive deficits  -ELOS 3/9 2.  Antithrombotics: -PE/anticoagulation:  Pharmaceutical: Other (comment)--on low dose Eliquis             -antiplatelet therapy:  N/A 3. Pain Management: Oxycodone or tramadol prn.   2/24: Therapist notes that Mr. Earwood has been having hypersensitivity of his gangrenous toes. Will add low dose Gabapentin 100mg  TID to help with pain-- low dose due to ESRD.  2/28 continue gabapentin---seems to be tolerating so far  3/1: Pain is well controlled.   3/3: gabapentin increased slightly for foot/toe pain.  3/4 pain levels fairly stable, ?some improvement last night.  4. Mood: LCSW to follow for evaluation and support.    See #14             -antipsychotic agents: N/A   See #14 5. Neuropsych: This patient is not fully capable of making decisions on his own behalf. 6. Skin/Wound Care: Dry dressing to bilateral feet.              trach stoma closed. PEG site intact   7. Fluids/Electrolytes/Nutrition:   2/23 stopped TF  -monitor intake.   8. A fib/NSVT: On amiodarone and metoprolol for rate control.              Rate in 90's on 2/25   -isolated bradycardia yesterday   3/2--3/3: rates remain in 90's   -see #9.   Monitor with increased activity.  -intermittent SOB, CXR demonstrates CHF. Nephrology working on volume mgt with HD 9.  ESRD: HD MWF at the end of the day to help with activity tolerance.   -volume/weight mgt  per nephrology,    3/3 weights stable Filed Weights   02/15/20 0348 02/15/20 1245 02/16/20 0352  Weight: 64.1 kg 65.5 kg 65.1 kg  10. Pericardial effusion: Has been on colchicine since 1/19 and has been weaned to prednisone 10 mg daily. Last echo 1/29 shows decrease to small effusion. 11. Hypotension: Continue midodrine 10 mg tid.  BP's still soft 2/26, 3/1, 3/3. A little better 3/4 12. Dysphagia:   Eating fairly well now  2/26- had MBS ---upgraded to regular/thins 13. Dry gangrene bilateral feet: Needs to wear shoes for protection. Dry dressing. Maintain adequate nutritional status.    -he is having ongoing pain in feet, right more than left.   -both feet cool, especially right  -wounds appear stable to improved but demonstrate ongoing gangrene as well  3/2-4 -vascular following patient. May need toe amps eventually  -continue local care, pain mgt 14. Anxiety/depression: On Zoloft 25 mg/day.  Team support.  ECG with prolonged QTC 512  Continue melatonin  Klonopin increased to 1 mg 3 times daily ---dc bedtime dose-likely lethargic partly d/t this---avoid excessive use 15. Insomnia: Per father, he sleeps late at night at baseline.   -2/27 still having difficulties more often than not at night d/t confusion/agitation/anxiety   -resumed seroquel 25mg  qhs   -QTC actually was DECREASED last week after giving seroquel the other day.       3/2 sleep generally improved   -has baseline insomnia   -pain and anxiety often inhibit sleep   -QTC further decreased on today's EKG which I reviewed  LOS: 13 days A FACE TO FACE EVALUATION WAS PERFORMED  Meredith Staggers 02/16/2020, 9:20 AM

## 2020-02-17 ENCOUNTER — Inpatient Hospital Stay (HOSPITAL_COMMUNITY): Payer: Medicare Other

## 2020-02-17 ENCOUNTER — Inpatient Hospital Stay (HOSPITAL_COMMUNITY): Payer: Medicare Other | Admitting: Occupational Therapy

## 2020-02-17 ENCOUNTER — Inpatient Hospital Stay (HOSPITAL_COMMUNITY): Payer: Medicare Other | Admitting: Speech Pathology

## 2020-02-17 ENCOUNTER — Inpatient Hospital Stay (HOSPITAL_COMMUNITY): Payer: Medicare Other | Admitting: Physical Therapy

## 2020-02-17 LAB — RENAL FUNCTION PANEL
Albumin: 3.9 g/dL (ref 3.5–5.0)
Anion gap: 16 — ABNORMAL HIGH (ref 5–15)
BUN: 40 mg/dL — ABNORMAL HIGH (ref 6–20)
CO2: 25 mmol/L (ref 22–32)
Calcium: 8.6 mg/dL — ABNORMAL LOW (ref 8.9–10.3)
Chloride: 95 mmol/L — ABNORMAL LOW (ref 98–111)
Creatinine, Ser: 3.05 mg/dL — ABNORMAL HIGH (ref 0.61–1.24)
GFR calc Af Amer: 26 mL/min — ABNORMAL LOW (ref >60)
GFR calc non Af Amer: 23 mL/min — ABNORMAL LOW (ref >60)
Glucose, Bld: 102 mg/dL — ABNORMAL HIGH (ref 70–99)
Phosphorus: 2.6 mg/dL (ref 2.5–4.6)
Potassium: 3.7 mmol/L (ref 3.5–5.1)
Sodium: 136 mmol/L (ref 135–145)

## 2020-02-17 LAB — CBC
HCT: 40.4 % (ref 39.0–52.0)
Hemoglobin: 12.7 g/dL — ABNORMAL LOW (ref 13.0–17.0)
MCH: 27.4 pg (ref 26.0–34.0)
MCHC: 31.4 g/dL (ref 30.0–36.0)
MCV: 87.3 fL (ref 80.0–100.0)
Platelets: 254 10*3/uL (ref 150–400)
RBC: 4.63 MIL/uL (ref 4.22–5.81)
RDW: 21.5 % — ABNORMAL HIGH (ref 11.5–15.5)
WBC: 8.6 10*3/uL (ref 4.0–10.5)
nRBC: 0.7 % — ABNORMAL HIGH (ref 0.0–0.2)

## 2020-02-17 MED ORDER — HEPARIN SODIUM (PORCINE) 1000 UNIT/ML DIALYSIS
20.0000 [IU]/kg | INTRAMUSCULAR | Status: DC | PRN
  Filled 2020-02-17: qty 2

## 2020-02-17 MED ORDER — MILK AND MOLASSES ENEMA
1.0000 | Freq: Once | RECTAL | Status: DC
  Filled 2020-02-17: qty 240

## 2020-02-17 NOTE — Progress Notes (Signed)
Physical Therapy Session Note  Patient Details  Name: [name] [name] MRN: [id number] Date of Birth: 08/27/1966  Today's Date: 02/17/2020 PT Individual Time: 0865-7846 and 1035-1102 PT Individual Time Calculation (min): 16 min and 27 min  Short Term Goals: Week 3:  PT Short Term Goal 1 (Week 3): STG = LTG due to estimated d/c date.  Skilled Therapeutic Interventions/Progress Updates:  Treatment 1: Pt in recliner & agreeable to tx. Vitals checked, SpO2 > or = 91% on 2L/min supplemental oxygen via nasal cannula, HR = 87 bpm, BP = 100/69 mmHg (RUE) so therapist donned B ace wraps for further BP management since pt with recent hx of low BP. Pt's father [name]) then arrived, and therapist educated him on DME recommendations (w/c for community use, RW for household mobility), recommendation of close supervision at d/c, as well as remove throw rugs 2/2 tripping hazards, and anticipate that pt will be on supplemental oxygen at d/c. Transportation services then arrived to take pt down for xray so pt assisted back to bed, sit<>stand, stand pivot recliner>w/c with RW & close supervision with therapist reiterating importance of close supervision to pt's father. Pt requires encouragement to transfer BLE into bed and eventually does so with extra time and use of BUE to assist BLE. Pt then left in bed in care of transporter. Will attempt to return shortly to complete session & caregiver training. Pain: pt only c/o pain when therapist doffed chair alarm belt 2/2 Peg tube, no c/o pain once finished doffing belt  Treatment 2: Pt received in recliner & agreeable to tx, pt's father [name]) present for session. Therapist educated pt & father on pt's low BP & utilization of ace wraps on BLE for BP management, need for pt to wear [facility] shoes to protect B feet at home, and HHPT f/u. Also encouraged pt to attempt tasks on his own at home, such as get legs in/out of bed without assistance and encouraged pt to walk frequently  throughout the day vs lying in bed/sitting in w/c & losing strength gained. Pt transfers sit<>stand with supervision and ambulates 170 ft with RW & close supervision with frequent standing rest breaks 2/2 fatigue with pt demonstrating forward lean onto RW with cuing but poor return demo for upright posture. Pt then performs BLE long arc quads & seated hip flexion for strengthening with pt able to almost achieve full knee extension & able to clear BLE from floor during hip flexion which is an improvement; therapist does provide instructional cuing for proper exercise technique. Vitals checked: BP 95/78 mmHg (RUE), HR = 86 bpm, SpO2 = 96% on 2L/min supplemental oxygen - nurse made aware of pt's low BP. Pt left in recliner with chair alarm donned, call bell in reach, & father present. [name] does not voice any questions/concerns re: pt's d/c home this coming week. No formal c/o pain reported by pt during session.  Therapy Documentation Precautions:  Precautions Precautions: Fall Precaution Comments: Peg, Darco shoes Restrictions Weight Bearing Restrictions: No   General: PT Amount of Missed Time (min): 27 Minutes PT Missed Treatment Reason: Xray   Therapy/Group: Individual Therapy  [name] 02/17/2020, 12:25 PM

## 2020-02-17 NOTE — Progress Notes (Signed)
Occupational Therapy Session Note  Patient Details  Name: [name] [name] MRN: [id number] Date of Birth: [birth year]  Today's Date: 02/17/2020 OT Individual Time: 7972-8206 OT Individual Time Calculation (min): 73 min   Short Term Goals: Week 3:  OT Short Term Goal 1 (Week 3): LTG=STG 2/2 ELOS  Skilled Therapeutic Interventions/Progress Updates:    Pt greeted sitting in recliner with shirt off, messing with his PEG tube site. Pt frustrated that it is bleeding and oozing. OT explained that tube will likely need to be in at least 6 weeks before it can be removed, but stated pt could ask Dr. Naaman Plummer when he rounded this morning. OT changed dressing. OT explained that pt could shower with PEG tube in, that he would just need to re-dress it afterwards. Pt declined to shower or complete any bathing/dressing tasks stating he had already washed up. OT placed tape on PEG tube to secure it to stomach and keep it from pulling. OT handed pt his shirt and he donned shirt with supervision and verbal cues for O2 management. OT educated pt further on O2 management as he will likely be going home on oxygen. OT explained correct orientation of nasal canula as well. Pts nephrology Dr. entered and pt stated he did not want to do dialysis today. Explained importance of doing dialysis to keep toxins from building up. Pt easily frustrated with all OT questions and education this morning. Pt donned Darco shoes with supervision and verbal cues. He then ambulated to the sink to brush teeth and was face. Verbal cues for B LE positioning to improve standing balance. Nursing entered to administer meds, then pt ambulated to therapy gym. UB there-ex w/ 3 sets of 10 chest press, bicep curls, and straight arm raises. Pt ambulated back to room and left seated in recliner with alarm belt on, call bell in reach, and needs met.  Therapy Documentation Precautions:  Precautions Precautions: Fall Precaution Comments: Peg, Darco  shoes Restrictions Weight Bearing Restrictions: No Pain:  Pt reports pain in B LE's, no number stated.   Therapy/Group: Individual Therapy  Valma Cava 02/17/2020, 8:19 AM

## 2020-02-17 NOTE — Progress Notes (Signed)
Speech Language Pathology Daily Session Note  Patient Details  Name: Winifred Balogh MRN: [id number] Date of Birth: Dec 14, 1966  Today's Date: 02/17/2020 SLP Individual Time: 0900-0940 SLP Individual Time Calculation (min): 40 min  Short Term Goals: Week 3: SLP Short Term Goal 1 (Week 3): STGs=LTGs due ELOS  Skilled Therapeutic Interventions: Skilled treatment session focused on cognitive goals. Upon arrival, patient was awake in the recliner but appeared lethargic. SLP facilitated session by providing Max A verbal cues for recall and Mod A verbal cues for problem solving and sustained attention to a previously learned card task.  Patient also required Max A multimodal cues for use of an increased vocal intensity at the phrase level, suspect due to fatigue. Patient left upright in recliner with alarm on and all needs within reach. Continue with current plan of care.      Pain No/Denies Pain   Therapy/Group: Individual Therapy  Estrellita Lasky 02/17/2020, 10:44 AM

## 2020-02-17 NOTE — Plan of Care (Signed)
  Problem: Consults Goal: RH GENERAL PATIENT EDUCATION Description: See Patient Education module for education specifics. Outcome: Progressing   Problem: RH BOWEL ELIMINATION Goal: RH STG MANAGE BOWEL WITH ASSISTANCE Description: STG Manage Bowel with Assistance. mod Outcome: Progressing   Problem: RH SKIN INTEGRITY Goal: RH STG ABLE TO PERFORM INCISION/WOUND CARE W/ASSISTANCE Description: STG Able To Perform Incision/Wound Care With Assistance. Mod Outcome: Not Progressing   Problem: RH SAFETY Goal: RH STG ADHERE TO SAFETY PRECAUTIONS W/ASSISTANCE/DEVICE Description: STG Adhere to Safety Precautions With Assistance/Device. Mod Outcome: Progressing

## 2020-02-17 NOTE — Progress Notes (Addendum)
[facility] Progress Note   Subjective:   Patient seen and examined at chairside in dialysis.  Reports being tired today but tolerating dialysis in chair so far.  Denies SOB, CP, n/v/d, abdominal pain, fever and chills.   Objective Vitals:   02/17/20 0249 02/17/20 0635 02/17/20 0818 02/17/20 1420  BP: 101/72 90/76 92/69  (P) 96/73  Pulse: 87 85 (!) 59 (P) 83  Resp: 20 20  (P) 20  Temp: 97.6 F (36.4 C)   (P) 97.9 F (36.6 C)  TempSrc:    (P) Oral  SpO2: 92% 98%  (P) 92%  Weight:      Height:       Physical Exam General:Chronically ill appearing male, in NAD Heart:RRR, no mrg Lungs:CTAB anterolaterally  Abdomen:soft, NTND, +BS, +PEG Extremities:No edema, feet in boots Dialysis Access: LU AVF +t/b   Filed Weights   02/15/20 0348 02/15/20 1245 02/16/20 0352  Weight: 64.1 kg 65.5 kg 65.1 kg    Intake/Output Summary (Last 24 hours) at 02/17/2020 1445 Last data filed at 02/17/2020 1300 Gross per 24 hour  Intake 822 ml  Output -  Net 822 ml    Additional Objective Labs: Basic Metabolic Panel: Recent Labs  Lab 02/13/20 0847 02/14/20 1127 02/15/20 1310  NA 133* 136 133*  K 4.8 4.1 4.9  CL 91* 92* 91*  CO2 24 26 23   GLUCOSE 116* 127* 112*  BUN 124* 77* 112*  CREATININE 7.06* 5.34* 6.75*  CALCIUM 8.6* 8.7* 8.7*  PHOS 2.7 3.6 4.9*   Liver Function Tests: Recent Labs  Lab 02/13/20 0847 02/14/20 1127 02/15/20 1310  ALBUMIN 3.6 3.5 3.6   CBC: Recent Labs  Lab 02/10/20 1836 02/13/20 0847 02/15/20 1311  WBC 5.4 6.8 7.4  HGB 13.1 12.3* 12.4*  HCT 43.8 40.8 40.6  MCV 92.0 90.3 88.6  PLT 154 196 229   Blood Culture    Component Value Date/Time   SDES BLOOD RIGHT HAND 12/17/2019 1520   SPECREQUEST  12/17/2019 1520    BOTTLES DRAWN AEROBIC ONLY Blood Culture results may not be optimal due to an inadequate volume of blood received in culture bottles   CULT  12/17/2019 1520    NO GROWTH 5 DAYS Performed at [facility], [place] [address] [place], [place] [address]    REPTSTATUS 12/22/2019 FINAL 12/17/2019 1520   Studies/Results: DG Abd 1 View  Result Date: 02/17/2020 CLINICAL DATA:  Abdominal pain, constipation EXAM: ABDOMEN - 1 VIEW COMPARISON:  None. FINDINGS: There is a gastrostomy tube present. Large amount of mottled material distending the stomach which may reflect large amount of inspissated post feed residuals. Moderate amount of stool in the ascending colon. There is no evidence of pneumoperitoneum, portal venous gas or pneumatosis. There are no pathologic calcifications along the expected course of the ureters. The osseous structures are unremarkable. IMPRESSION: Large amount of mottled material distending the stomach which may reflect large amount of inspissated post feed residuals. Electronically Signed   By: Kathreen Devoid   On: 02/17/2020 10:43    Medications:  . amiodarone  200 mg Oral Daily  . apixaban  2.5 mg Oral BID  . ascorbic acid  500 mg Oral BID  . benztropine  1 mg Oral BID  . Chlorhexidine Gluconate Cloth  6 each Topical Q0600  . clonazePAM  1 mg Oral TID WC  . colchicine  0.6 mg Oral Daily  . feeding supplement (PRO-STAT SUGAR FREE 64)  30 mL Oral BID  .  folic acid  1 mg Oral Daily  . free water  100 mL Per Tube BID  . gabapentin  100 mg Oral Q8H  . levothyroxine  25 mcg Oral Q0600  . mouth rinse  15 mL Mouth Rinse BID  . Melatonin  3 mg Oral QHS  . metoprolol tartrate  25 mg Oral TID WC  . midodrine  10 mg Oral TID WC  . milk and molasses  1 enema Rectal Once  . multivitamin  1 tablet Oral QHS  . predniSONE  10 mg Oral Q breakfast  . QUEtiapine  25 mg Oral QHS  . thiamine  100 mg Oral Daily    Dialysis Orders: Prev orders - will need to change significantly on d/c MWF at [facility], 4:15hr, EDW 74.5kg, 2K/2.5Ca, AVF, heparin 5K - Calcitriol 0.69mcg PO q HD. No ESA or venofer.  Assessment/Plan: 1. Deconditioning: S/p prolonged [facility] admit (12/2-12/23/19) s/p cardiac arrest, then [facility] admit  until 2/21. Now CIR. 2. ESRD:Continue HD per MWF schedule but will be changing to TTS at discharge, may require HD prior to d/c on 3/9 depending on labs.  HD today per regular schedule. K+ controlled. 3. HTN/volume:BPlow, currently on metoprololfor rate control+ midodrine.CXR 2/24and 2/28withpersistent pulm edemaand patient still on O2. BP dropping with HD despite extra midodrine dose, limiting UF.  4. Anemia:last Hgb12.4.- no ESA needed. 5. Secondary hyperparathyroidism:Caat goal, phos 4.9.No binders/VDRA. 6. Hx cardiac arrest 7. Hx PE: On Eliquis 8. A-fib: On Eliquis + amiodarone 9. Hx pericardial effusion:colchicine +prednisone 10mg  QD. 10. Anxiety + lethargy: Meds per primary. 11. HFrEF (<20%) 12. 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 On gabapentin 100mg  TID which is an appropriate renal dose, caution using higher doses.  Jen Mow, PA-C [facility] Pager: [phone] 02/17/2020,2:45 PM  LOS: 14 days   Nephrology attending: Patient was seen and examined.  Chart reviewed.  I agree with assessment as outlined above.  ESRD on HD, plan for dialysis today per schedule.  Inpatient rehab ongoing.  Continue current medication.  Monitor phosphorus level.  Katheran James, MD [facility].

## 2020-02-17 NOTE — Progress Notes (Signed)
Nutrition Follow-up  DOCUMENTATION CODES:   Not applicable  INTERVENTION:   Continue Snacks TID  Continue MVI daily   NUTRITION DIAGNOSIS:   Increased nutrient needs related to acute illness, chronic illness as evidenced by estimated needs.  Ongoing.  GOAL:   Patient will meet greater than or equal to 90% of their needs  Progressing.   MONITOR:   PO intake, Supplement acceptance, Labs, Weight trends, TF tolerance  REASON FOR ASSESSMENT:   Consult Enteral/tube feeding initiation and management, Assessment of nutrition requirement/status  ASSESSMENT:   54 yo male admitted to CIR from [facility] after prolonged hospitalization post cardiac arrest on 11/16/2019 requiring trach and PEG. PMH includes ESRD on HD, CHF, HTN, cocaine abuse, medical non-adherence.  12/2 Admit to [name] Cardiac Arrest 1/08 Discharged and Admitted with [facility] 1/25 G tube placed 2/17 De-Cannulated  2/19 Admit to CIR, Renal diet initiated 2/20 Diet downgraded to Dysphagia 3, Nectar 2/26 Renal diet ordered with thin liquids  Per MD, pt's abdomen distended today with mild discomfort; KUB ordered.   Pt providing one-word responses to RD questions; states appetite is fine.   PO intake: 75-100% x last 8 recorded meals (90% average intake)  Current weight: 65.1 kg Admission weight: 62.6 kg Pt noted to have mild pitting edema.   Last HD: 02/15/20 Net UF: 524ml Plan for HD today.   Medications reviewed and include: Vitamin C, 63ml Pro-stat BID, Folvite, Milk and molasses enema, Rena-vit, Deltasone, Thiamine  Labs reviewed: Sodium 133, Phosphorus 4.9  NUTRITION - FOCUSED PHYSICAL EXAM:    Most Recent Value  Orbital Region  No depletion  Upper Arm Region  No depletion  Thoracic and Lumbar Region  No depletion  Buccal Region  No depletion  Temple Region  No depletion  Clavicle Bone Region  No depletion  Clavicle and Acromion Bone Region  No depletion  Scapular Bone Region  No  depletion  Dorsal Hand  No depletion  Patellar Region  No depletion  Anterior Thigh Region  No depletion  Posterior Calf Region  No depletion  Edema (RD Assessment)  Mild  Hair  Reviewed  Eyes  Reviewed  Mouth  Reviewed  Skin  Reviewed  Nails  Reviewed       Diet Order:   Diet Order            Diet renal with fluid restriction Fluid restriction: 1200 mL Fluid; Room service appropriate? Yes; Fluid consistency: Thin  Diet effective now              EDUCATION NEEDS:   Not appropriate for education at this time  Skin:  Skin Assessment: Skin Integrity Issues: Skin Integrity Issues:: Other (Comment) Other: bilateral feet with dry gangrene; open area to bottom of 2nd & 3rd toe on left foot, 2nd & 5th toe on right foot  Last BM:  3/4  Height:   Ht Readings from Last 1 Encounters:  02/06/20 5' 10.5" (1.791 m)    Weight:   Wt Readings from Last 1 Encounters:  02/16/20 65.1 kg     BMI:  Body mass index is 20.3 kg/m.  Estimated Nutritional Needs:   Kcal:  2200-2400 kcals  Protein:  110-120 g  Fluid:  1000 mL plus UOP    Larkin Ina, MS, RD, LDN RD pager number and weekend/on-call pager number located in [place].

## 2020-02-17 NOTE — Progress Notes (Addendum)
[place] PHYSICAL MEDICINE & REHABILITATION PROGRESS NOTE  Subjective/Complaints: C/o blood around PEG. Some discomfort. Having gas (burped when I came in). Foot pain ongoing  ROS: Patient denies fever, rash, sore throat, blurred vision, nausea, vomiting, diarrhea, cough, shortness of breath or chest pain, joint or back pain, headache, or mood change.    Objective: Vital Signs: Blood pressure 92/69, pulse (!) 59, temperature 97.6 F (36.4 C), resp. rate 20, height 5' 10.5" (1.791 m), weight 65.1 kg, SpO2 98 %. No results found. Recent Labs    02/15/20 1311  WBC 7.4  HGB 12.4*  HCT 40.6  PLT 229   Recent Labs    02/14/20 1127 02/15/20 1310  NA 136 133*  K 4.1 4.9  CL 92* 91*  CO2 26 23  GLUCOSE 127* 112*  BUN 77* 112*  CREATININE 5.34* 6.75*  CALCIUM 8.7* 8.7*    Physical Exam: BP 92/69 (BP Location: Right Arm)   Pulse (!) 59   Temp 97.6 F (36.4 C)   Resp 20   Ht 5' 10.5" (1.791 m)   Wt 65.1 kg   SpO2 98%   BMI 20.30 kg/m  Constitutional: No distress . Vital signs reviewed. HEENT: EOMI, oral membranes moist Neck: supple Cardiovascular: RRR without murmur. No JVD    Respiratory/Chest: CTA Bilaterally without wheezes or rales. Normal effort    GI/Abdomen: BS +, sl bloody drainage around PEG, distended, tender  Skin:   Bilateral toes gangrenous, right more involved than left, esp great toe still. No changes. Sl edema in feet ongoing Psych: flat but engages Musc: No edema in extremities.  No tenderness in extremities. Neurological: alert, dysarthric speech. Processing better. Better awareness Motor: 4- to /5 in all 4's with pain inhibition at the feet  Assessment/Plan: 1. Functional deficits secondary to encephalopathy which require 3+ hours per day of interdisciplinary therapy in a comprehensive inpatient rehab setting.  Physiatrist is providing close team supervision and 24 hour management of active medical problems listed below.  Physiatrist and  rehab team continue to assess barriers to discharge/monitor patient progress toward functional and medical goals  Care Tool:  Bathing  Bathing activity did not occur: Refused Body parts bathed by patient: Buttocks, Front perineal area   Body parts bathed by helper: Abdomen, Right lower leg, Left lower leg     Bathing assist Assist Level: Supervision/Verbal cueing     Upper Body Dressing/Undressing Upper body dressing Upper body dressing/undressing activity did not occur (including orthotics): N/A What is the patient wearing?: Pull over shirt    Upper body assist Assist Level: Moderate Assistance - Patient 50 - 74%    Lower Body Dressing/Undressing Lower body dressing    Lower body dressing activity did not occur: N/A What is the patient wearing?: Underwear/pull up, Pants     Lower body assist Assist for lower body dressing: Moderate Assistance - Patient 50 - 74%     Toileting Toileting    Toileting assist Assist for toileting: Minimal Assistance - Patient > 75%     Transfers Chair/bed transfer  Transfers assist     Chair/bed transfer assist level: Contact Guard/Touching assist     Locomotion Ambulation   Ambulation assist   Ambulation activity did not occur: N/A  Assist level: Contact Guard/Touching assist Assistive device: Walker-rolling Max distance: 30   Walk 10 feet activity   Assist     Assist level: Contact Guard/Touching assist Assistive device: Walker-rolling   Walk 50 feet activity   Assist  Assist level: Contact Guard/Touching assist Assistive device: Walker-rolling    Walk 150 feet activity   Assist Walk 150 feet activity did not occur: Safety/medical concerns         Walk 10 feet on uneven surface  activity   Assist Walk 10 feet on uneven surfaces activity did not occur: Safety/medical concerns         Wheelchair     Assist Will patient use wheelchair at discharge?: Yes Type of Wheelchair:  Manual Wheelchair activity did not occur: N/A  Wheelchair assist level: Minimal Assistance - Patient > 75% Max wheelchair distance: 150 ft    Wheelchair 50 feet with 2 turns activity    Assist        Assist Level: Minimal Assistance - Patient > 75%   Wheelchair 150 feet activity     Assist     Assist Level: Minimal Assistance - Patient > 75%      Medical Problem List and Plan: 1.  Deficits with mobility, endurance, self-care secondary to encephalopathy with debility, PVD with gangrenous toes.  -Continue CIR therapies including PT, OT, and SLP   -pt with baseline cognitive deficits  -ELOS 3/9 2.  Antithrombotics: -PE/anticoagulation:  Pharmaceutical: Other (comment)--on low dose Eliquis             -antiplatelet therapy:  N/A 3. Pain Management: Oxycodone or tramadol prn.   2/24: Therapist notes that [name] [name] has been having hypersensitivity of his gangrenous toes. Will add low dose Gabapentin 100mg  TID to help with pain-- low dose due to ESRD.  2/28 continue gabapentin---seems to be tolerating so far  3/1: Pain is well controlled.   3/3: gabapentin increased slightly for foot/toe pain.  3/5  Foot/toe pain seems better controlled  4. Mood: LCSW to follow for evaluation and support.    See #14             -antipsychotic agents: N/A   See #14 5. Neuropsych: This patient is not fully capable of making decisions on his own behalf. 6. Skin/Wound Care: Dry dressing to bilateral feet.              trach stoma closed. PEG site intact   7. Fluids/Electrolytes/Nutrition:   2/23 stopped TF  -monitor intake.   8. A fib/NSVT: On amiodarone and metoprolol for rate control.              Rate in 90's on 2/25   -isolated bradycardia yesterday   3/2--3/3: rates remain in 90's   -see #9.   Monitor with increased activity.  -intermittent SOB, CXR demonstrates CHF. Nephrology working on volume mgt with HD 9.  ESRD: HD MWF at the end of the day to help with activity  tolerance.   -volume/weight mgt per nephrology,    3/3 weights stable Filed Weights   02/15/20 0348 02/15/20 1245 02/16/20 0352  Weight: 64.1 kg 65.5 kg 65.1 kg  10. Pericardial effusion: Has been on colchicine since 1/19 and has been weaned to prednisone 10 mg daily. Last echo 1/29 shows decrease to small effusion. 11. Hypotension: Continue midodrine 10 mg tid.  BP's still soft 2/26, 3/1, 3/3. A little better 3/4 12. Dysphagia/PEG:   Eating fairly well now  2/26- had MBS ---upgraded to regular/thins  3/5: abdomen distended today with mild discomfort. has had two medium bm's over last 2 days.    -check kub given distention   -advised patient that he may have some discharge from around PEG. Change dressings as  needed 13. Dry gangrene bilateral feet: Needs to wear shoes for protection. Dry dressing. Maintain adequate nutritional status.    -he is having ongoing pain in feet, right more than left.   -both feet cool, especially right  -wounds appear stable to improved but demonstrate ongoing gangrene as well  3/2-4 -vascular following patient. May need toe amps eventually  -continue local care, pain mgt 14. Anxiety/depression: On Zoloft 25 mg/day.  Team support.  ECG with prolonged QTC 512  Continue melatonin  Klonopin increased  1 mg 3 times daily 15. Insomnia: Per father, he sleeps late at night at baseline.   -2/27 still having difficulties more often than not at night d/t confusion/agitation/anxiety   -resumed seroquel 25mg  qhs   -QTC actually was DECREASED last week after giving seroquel the other day.       3/2 sleep generally improved   -has baseline insomnia   -pain and anxiety often inhibit sleep   -QTC further decreased on  EKG    LOS: 14 days A FACE TO FACE EVALUATION WAS PERFORMED  Meredith Staggers 02/17/2020, 9:03 AM

## 2020-02-17 NOTE — Plan of Care (Signed)
  Problem: RH Expression Communication Goal: LTG Patient will increase speech intelligibility (SLP) Description: LTG: Patient will increase speech intelligibility at word/phrase/conversation level with cues, % of the time (SLP) Flowsheets (Taken 02/17/2020 1046) LTG: Patient will increase speech intelligibility (SLP): Moderate Assistance - Patient 50 - 74% Level: Phrase Note: Downgraded due to lack of progress, CMP    Problem: RH Memory Goal: LTG Patient will use memory compensatory aids to (SLP) Description: LTG:  Patient will use memory compensatory aids to recall biographical/new, daily complex information with cues (SLP) Flowsheets (Taken 02/17/2020 1046) LTG: Patient will use memory compensatory aids to (SLP): Moderate Assistance - Patient 50 - 74% Note: Downgraded due to lack of progress, CMP    Problem: RH Attention Goal: LTG Patient will demonstrate this level of attention during functional activites (SLP) Description: LTG:  Patient will will demonstrate this level of attention during functional activites (SLP) Flowsheets (Taken 02/17/2020 1046) LTG: Patient will demonstrate this level of attention during cognitive/linguistic activities with assistance of (SLP): Moderate Assistance - Patient 50 - 74% Note: Downgraded due to lack of progress, CMP

## 2020-02-17 NOTE — Progress Notes (Addendum)
Pt was very drowsy at the beginning of shift and became more awake around midnight. Pt slept on and off throughout the night.  Pt continues to c/o shortness of breathe when in bed but complains when [facility] is increased. Pt states he feels better when he's upright in the chair or sitting on edge of bed. Pt coughs on and off producing minimal thick tan sputum. Pt O2 sats stable on 2 L of oxygen [place].  Moderate bloody drainage noted to peg tube with distended abdomen requiring 4 gauze changes.   Measured wounds to left foot.  Left big toe black 4 cm x 3 cm, then each little toe blackened and bloody 1 cm X 1 cm. Pt refused right foot measurements.   Call light in reach. No signs of distress at this time. Pt states he feels "okay"

## 2020-02-17 NOTE — Progress Notes (Signed)
Patient off unit at approximately 1400 for dialysis

## 2020-02-18 ENCOUNTER — Inpatient Hospital Stay (HOSPITAL_COMMUNITY): Payer: Medicare Other | Admitting: Occupational Therapy

## 2020-02-18 MED ORDER — DOCUSATE SODIUM 50 MG/5ML PO LIQD
100.0000 mg | Freq: Three times a day (TID) | ORAL | Status: DC
  Administered 2020-02-18 – 2020-02-27 (×20): 100 mg via ORAL
  Filled 2020-02-18 (×23): qty 10

## 2020-02-18 MED ORDER — MAGNESIUM CITRATE PO SOLN
1.0000 | Freq: Once | ORAL | Status: DC
  Filled 2020-02-18: qty 296

## 2020-02-18 MED ORDER — SENNA 8.6 MG PO TABS
2.0000 | ORAL_TABLET | Freq: Every day | ORAL | Status: DC
  Administered 2020-02-18 – 2020-02-26 (×9): 17.2 mg via ORAL
  Filled 2020-02-18 (×9): qty 2

## 2020-02-18 MED ORDER — FUROSEMIDE 20 MG PO TABS
20.0000 mg | ORAL_TABLET | Freq: Once | ORAL | Status: AC
  Administered 2020-02-18: 20 mg via ORAL
  Filled 2020-02-18: qty 1

## 2020-02-18 NOTE — Progress Notes (Signed)
Patient complaining of being short of breath this evening. BP 101/90, HR 99, oxygen 97 on 2L [place]. Appears more labored with activity. MD Raulkar notified. New orders received. Order to hold Metoprolol. Continue to monitor.

## 2020-02-18 NOTE — Plan of Care (Signed)
  Problem: Consults Goal: RH GENERAL PATIENT EDUCATION Description: See Patient Education module for education specifics. Outcome: Progressing   Problem: RH BOWEL ELIMINATION Goal: RH STG MANAGE BOWEL WITH ASSISTANCE Description: STG Manage Bowel with Assistance. mod Outcome: Progressing   Problem: RH SKIN INTEGRITY Goal: RH STG SKIN FREE OF INFECTION/BREAKDOWN Description: Free of breakdown and infection with mod assist Outcome: Progressing Goal: RH STG ABLE TO PERFORM INCISION/WOUND CARE W/ASSISTANCE Description: STG Able To Perform Incision/Wound Care With Assistance. Mod Outcome: Progressing   Problem: RH SAFETY Goal: RH STG ADHERE TO SAFETY PRECAUTIONS W/ASSISTANCE/DEVICE Description: STG Adhere to Safety Precautions With Assistance/Device. Mod Outcome: Progressing   Problem: RH PAIN MANAGEMENT Goal: RH STG PAIN MANAGED AT OR BELOW PT'S PAIN GOAL Description: Less than 4 Outcome: Progressing

## 2020-02-18 NOTE — Progress Notes (Signed)
[place] PHYSICAL MEDICINE & REHABILITATION PROGRESS NOTE  Subjective/Complaints: Looks much more uncomfortable than usual. Belly breathing. KUB shows large amount of stool.  Refused enema last night. Has loose stools, likely leaking around a hard mass. Willing to try mag citrate.   ROS: Patient denies fever, rash, sore throat, blurred vision, nausea, vomiting, diarrhea, cough, shortness of breath or chest pain, joint or back pain, headache, or mood change.    Objective: Vital Signs: Blood pressure 107/71, pulse 89, temperature 97.9 F (36.6 C), resp. rate 16, height 5' 10.5" (1.791 m), weight 64.5 kg, SpO2 96 %. DG Abd 1 View  Result Date: 02/17/2020 CLINICAL DATA:  Abdominal pain, constipation EXAM: ABDOMEN - 1 VIEW COMPARISON:  None. FINDINGS: There is a gastrostomy tube present. Large amount of mottled material distending the stomach which may reflect large amount of inspissated post feed residuals. Moderate amount of stool in the ascending colon. There is no evidence of pneumoperitoneum, portal venous gas or pneumatosis. There are no pathologic calcifications along the expected course of the ureters. The osseous structures are unremarkable. IMPRESSION: Large amount of mottled material distending the stomach which may reflect large amount of inspissated post feed residuals. Electronically Signed   By: Kathreen Devoid   On: 02/17/2020 10:43   Recent Labs    02/15/20 1311 02/17/20 1606  WBC 7.4 8.6  HGB 12.4* 12.7*  HCT 40.6 40.4  PLT 229 254   Recent Labs    02/15/20 1310 02/17/20 1606  NA 133* 136  K 4.9 3.7  CL 91* 95*  CO2 23 25  GLUCOSE 112* 102*  BUN 112* 40*  CREATININE 6.75* 3.05*  CALCIUM 8.7* 8.6*    Physical Exam: BP 107/71   Pulse 89   Temp 97.9 F (36.6 C)   Resp 16   Ht 5' 10.5" (1.791 m)   Wt 64.5 kg   SpO2 96%   BMI 20.11 kg/m  Constitutional: In distress . Vital signs reviewed. HEENT: EOMI, oral membranes moist Neck: supple Cardiovascular:  RRR without murmur. No JVD    Respiratory/Chest: CTA Bilaterally without wheezes or rales. Belly breathing.   GI/Abdomen: BS +, sl bloody drainage around PEG, distended, tender  Skin:   Bilateral toes gangrenous, right more involved than left, esp great toe still. No changes. Sl edema in feet ongoing Psych: flat but engages Musc: No edema in extremities.  No tenderness in extremities. Neurological: alert, dysarthric speech. Processing better. Better awareness Motor: 4- to /5 in all 4's with pain inhibition at the feet  Assessment/Plan: 1. Functional deficits secondary to encephalopathy which require 3+ hours per day of interdisciplinary therapy in a comprehensive inpatient rehab setting.  Physiatrist is providing close team supervision and 24 hour management of active medical problems listed below.  Physiatrist and rehab team continue to assess barriers to discharge/monitor patient progress toward functional and medical goals  Care Tool:  Bathing  Bathing activity did not occur: Refused Body parts bathed by patient: Buttocks, Front perineal area   Body parts bathed by helper: Abdomen, Right lower leg, Left lower leg     Bathing assist Assist Level: Supervision/Verbal cueing     Upper Body Dressing/Undressing Upper body dressing Upper body dressing/undressing activity did not occur (including orthotics): N/A What is the patient wearing?: Pull over shirt    Upper body assist Assist Level: Moderate Assistance - Patient 50 - 74%    Lower Body Dressing/Undressing Lower body dressing    Lower body dressing activity did not occur: N/A  What is the patient wearing?: Underwear/pull up, Pants     Lower body assist Assist for lower body dressing: Moderate Assistance - Patient 50 - 74%     Toileting Toileting    Toileting assist Assist for toileting: Minimal Assistance - Patient > 75%     Transfers Chair/bed transfer  Transfers assist     Chair/bed transfer assist level:  Supervision/Verbal cueing     Locomotion Ambulation   Ambulation assist   Ambulation activity did not occur: N/A  Assist level: Supervision/Verbal cueing Assistive device: Walker-rolling Max distance: 170 ft   Walk 10 feet activity   Assist     Assist level: Supervision/Verbal cueing Assistive device: Walker-rolling   Walk 50 feet activity   Assist    Assist level: Supervision/Verbal cueing Assistive device: Walker-rolling    Walk 150 feet activity   Assist Walk 150 feet activity did not occur: Safety/medical concerns  Assist level: Supervision/Verbal cueing Assistive device: Walker-rolling    Walk 10 feet on uneven surface  activity   Assist Walk 10 feet on uneven surfaces activity did not occur: Safety/medical concerns         Wheelchair     Assist Will patient use wheelchair at discharge?: Yes Type of Wheelchair: Manual Wheelchair activity did not occur: N/A  Wheelchair assist level: Minimal Assistance - Patient > 75% Max wheelchair distance: 150 ft    Wheelchair 50 feet with 2 turns activity    Assist        Assist Level: Minimal Assistance - Patient > 75%   Wheelchair 150 feet activity     Assist     Assist Level: Minimal Assistance - Patient > 75%      Medical Problem List and Plan: 1.  Deficits with mobility, endurance, self-care secondary to encephalopathy with debility, PVD with gangrenous toes.  -Continue CIR therapies including PT, OT, and SLP   -pt with baseline cognitive deficits  -ELOS 3/9 2.  Antithrombotics: -PE/anticoagulation:  Pharmaceutical: Other (comment)--on low dose Eliquis             -antiplatelet therapy:  N/A 3. Pain Management: Oxycodone or tramadol prn.   2/24: Therapist notes that Mr. Vineyard has been having hypersensitivity of his gangrenous toes. Will add low dose Gabapentin 100mg  TID to help with pain-- low dose due to ESRD.  2/28 continue gabapentin---seems to be tolerating so  far  3/1: Pain is well controlled.   3/3: gabapentin increased slightly for foot/toe pain.  3/5  Foot/toe pain seems better controlled  4. Mood: LCSW to follow for evaluation and support.    See #14             -antipsychotic agents: N/A   See #14 5. Neuropsych: This patient is not fully capable of making decisions on his own behalf. 6. Skin/Wound Care: Dry dressing to bilateral feet.              trach stoma closed. PEG site intact   7. Fluids/Electrolytes/Nutrition:   2/23 stopped TF  -monitor intake.   8. A fib/NSVT: On amiodarone and metoprolol for rate control.              Rate in 90's on 2/25   -isolated bradycardia yesterday   3/2--3/3: rates remain in 90's   -see #9.   3/6: rate in 80s  Monitor with increased activity.  -intermittent SOB, CXR demonstrates CHF. Nephrology working on volume mgt with HD 9.  ESRD: HD MWF at the end of the  day to help with activity tolerance.   -volume/weight mgt per nephrology,    3/3 weights stable  3/6: weight decreased Filed Weights   02/16/20 0352 02/17/20 1826 02/18/20 0455  Weight: 65.1 kg 67.1 kg 64.5 kg  10. Pericardial effusion: Has been on colchicine since 1/19 and has been weaned to prednisone 10 mg daily. Last echo 1/29 shows decrease to small effusion. 11. Hypotension: Continue midodrine 10 mg tid.  BP's still soft 2/26, 3/1, 3/3. A little better 3/4  3/6: continue to be very soft.  12. Dysphagia/PEG:   Eating fairly well now  2/26- had MBS ---upgraded to regular/thins  3/5: abdomen distended today with mild discomfort. has had two medium bm's over last 2 days.    -check kub given distention   -advised patient that he may have some discharge from around PEG. Change dressings as needed  3/6: likely having loose stools around constipated mass; KUB shows retained stool in bowel and questionable undigested food in stomach. Refused enema. Provided education about why he needs it but continues to refuse. Will order mag citrate to  which he is agreeable.  13. Dry gangrene bilateral feet: Needs to wear shoes for protection. Dry dressing. Maintain adequate nutritional status.    -he is having ongoing pain in feet, right more than left.   -both feet cool, especially right  -wounds appear stable to improved but demonstrate ongoing gangrene as well  3/2-4 -vascular following patient. May need toe amps eventually  -continue local care, pain mgt  3/6: Patient continues to pick at wounds despite education not to.  14. Anxiety/depression: On Zoloft 25 mg/day.  Team support.  ECG with prolonged QTC 512  Continue melatonin  Klonopin increased  1 mg 3 times daily 15. Insomnia: Per father, he sleeps late at night at baseline.   -2/27 still having difficulties more often than not at night d/t confusion/agitation/anxiety   -resumed seroquel 25mg  qhs   -QTC actually was DECREASED last week after giving seroquel the other day.       3/2 sleep generally improved   -has baseline insomnia   -pain and anxiety often inhibit sleep   -QTC further decreased on  EKG    LOS: 15 days A FACE TO FACE EVALUATION WAS PERFORMED  Faizon Capozzi P Rosealee Recinos 02/18/2020, 11:56 AM

## 2020-02-18 NOTE — Significant Event (Signed)
Pt is refusing "measurement" of bilateral foot/toe wounds. Pt is refusing dressing changes to bilateral foot/toe wounds. Pt continues to pick at bilateral feet and toes. This nurse discussed possibility of further infection. Pt verbalizes understanding but continues to undress wounds and pick at them.

## 2020-02-18 NOTE — Progress Notes (Signed)
Occupational Therapy Session Note  Patient Details  Name: [name] [name] MRN: [id number] Date of Birth: 02/01/1966  Today's Date: 02/18/2020 OT Individual Time: 1345-1410 OT Individual Time Calculation (min): 25 min  and Today's Date: 02/18/2020 OT Missed Time: 20 Minutes Missed Time Reason: Patient fatigue   Short Term Goals: Week 2:  OT Short Term Goal 1 (Week 2): Pt will complete sit<>stand with no more than CGA within BADL tasks OT Short Term Goal 1 - Progress (Week 2): Met OT Short Term Goal 2 (Week 2): Pt will complete LB dressing with no more than min A OT Short Term Goal 2 - Progress (Week 2): Met OT Short Term Goal 3 (Week 2): Pt will don [facility] boots with no more than min A OT Short Term Goal 3 - Progress (Week 2): Met  Skilled Therapeutic Interventions/Progress Updates:   Pt greeted asleep in recliner, pt breathing heavy and sleeping very soundly. Per nursing, pt has been very lethargic all day. OT able to wake pt for brief periods, but pt often dozing back off and had difficulty maintaining alertness. Pt reported his feet felt wet and cold 2/2 spilling water on them and wanted to change socks. OT provided pt with clean socks and he needed increased time and verbal cues to maintain alertness to don new socks in figure 4 position at min A level. Pt requested some coffee. OT checked with nursing who said pt could have coffee. OT made coffee and went to bring to pt and he was already asleep again. PT left in recliner with call bell in reach and needs met.   Therapy Documentation Precautions:  Precautions Precautions: Fall Precaution Comments: Peg, [place] shoes Restrictions Weight Bearing Restrictions: No General: General OT Amount of Missed Time: 20 Minutes Pain: Pain Assessment Pt reports pain in toes, but unable to give number   Therapy/Group: Individual Therapy  Valma Cava 02/18/2020, 2:36 PM

## 2020-02-18 NOTE — Progress Notes (Signed)
Patient refusing mag citrate- reports "I went twice yesterday". Patient denies having loose bowel moments early this morning (despite 2 beings documented by NT) 1 formed bowel moment charted yesterday in dialysis. Patient educated on abdominal distention and need for additional laxatives to effectively empty out. Dr Ranell Patrick notified of patient refusal. Continue to monitor.

## 2020-02-18 NOTE — Progress Notes (Addendum)
[facility] Progress Note   Subjective:   Patient seen in room. O2 nasal cannula in hands, replaced in nares. Patient denies SOB, CP, dizziness, palpitations, abdominal pain, N/V/D. Reports ongoing foot pain. Appears tired this morning.   Objective Vitals:   02/17/20 1838 02/17/20 1942 02/18/20 0455 02/18/20 0654  BP: 99/73 90/70 (!) 86/70 107/71  Pulse: 93 92 89   Resp: 18 18 16    Temp: 97.9 F (36.6 C) (!) 97.5 F (36.4 C) 97.9 F (36.6 C)   TempSrc:      SpO2: 96% 97% 96%   Weight:   64.5 kg   Height:       Physical Exam General: Chronically ill appearing male, sleeping but rouses to verbal stimuli. NAD Heart: RRR, no murmurs, rubs or gallops Lungs: CTA bilaterally without wheezing, rhonchi or rales Abdomen: Soft, non-tender, +BS Extremities: No edema b/l ankles, feet in boots Dialysis Access:  LUE AVF + bruit  Additional Objective Labs: Basic Metabolic Panel: Recent Labs  Lab 02/14/20 1127 02/15/20 1310 02/17/20 1606  NA 136 133* 136  K 4.1 4.9 3.7  CL 92* 91* 95*  CO2 26 23 25   GLUCOSE 127* 112* 102*  BUN 77* 112* 40*  CREATININE 5.34* 6.75* 3.05*  CALCIUM 8.7* 8.7* 8.6*  PHOS 3.6 4.9* 2.6   Liver Function Tests: Recent Labs  Lab 02/14/20 1127 02/15/20 1310 02/17/20 1606  ALBUMIN 3.5 3.6 3.9   No results for input(s): LIPASE, AMYLASE in the last 168 hours. CBC: Recent Labs  Lab 02/13/20 0847 02/15/20 1311 02/17/20 1606  WBC 6.8 7.4 8.6  HGB 12.3* 12.4* 12.7*  HCT 40.8 40.6 40.4  MCV 90.3 88.6 87.3  PLT 196 229 254   Blood Culture    Component Value Date/Time   SDES BLOOD RIGHT HAND 12/17/2019 1520   SPECREQUEST  12/17/2019 1520    BOTTLES DRAWN AEROBIC ONLY Blood Culture results may not be optimal due to an inadequate volume of blood received in culture bottles   CULT  12/17/2019 1520    NO GROWTH 5 DAYS Performed at [facility], [place] [address], [place], [place] [address]    REPTSTATUS 12/22/2019 FINAL  12/17/2019 1520     Studies/Results: DG Abd 1 View  Result Date: 02/17/2020 CLINICAL DATA:  Abdominal pain, constipation EXAM: ABDOMEN - 1 VIEW COMPARISON:  None. FINDINGS: There is a gastrostomy tube present. Large amount of mottled material distending the stomach which may reflect large amount of inspissated post feed residuals. Moderate amount of stool in the ascending colon. There is no evidence of pneumoperitoneum, portal venous gas or pneumatosis. There are no pathologic calcifications along the expected course of the ureters. The osseous structures are unremarkable. IMPRESSION: Large amount of mottled material distending the stomach which may reflect large amount of inspissated post feed residuals. Electronically Signed   By: Kathreen Devoid   On: 02/17/2020 10:43   Medications:  . amiodarone  200 mg Oral Daily  . apixaban  2.5 mg Oral BID  . ascorbic acid  500 mg Oral BID  . benztropine  1 mg Oral BID  . Chlorhexidine Gluconate Cloth  6 each Topical Q0600  . clonazePAM  1 mg Oral TID WC  . colchicine  0.6 mg Oral Daily  . feeding supplement (PRO-STAT SUGAR FREE 64)  30 mL Oral BID  . folic acid  1 mg Oral Daily  . free water  100 mL Per Tube BID  . gabapentin  100 mg Oral  Q8H  . levothyroxine  25 mcg Oral Q0600  . magnesium citrate  1 Bottle Oral Once  . mouth rinse  15 mL Mouth Rinse BID  . Melatonin  3 mg Oral QHS  . metoprolol tartrate  25 mg Oral TID WC  . midodrine  10 mg Oral TID WC  . milk and molasses  1 enema Rectal Once  . multivitamin  1 tablet Oral QHS  . predniSONE  10 mg Oral Q breakfast  . QUEtiapine  25 mg Oral QHS  . thiamine  100 mg Oral Daily    Dialysis Orders: Prev orders - will need to change significantly on d/c MWF at [facility], 4:15hr, EDW 74.5kg, 2K/2.5Ca, AVF, heparin 5K - Calcitriol 0.66mcg PO q HD. No ESA or venofer.   Assessment/Plan: 1.Deconditioning: S/p prolonged [facility] admit (12/2-12/23/19) s/p cardiac arrest, then [facility] until 2/21. Now  CIR. 2. ESRD:Continue HD per MWF schedulebut will be changing to TTS at discharge, may require HD prior to d/c on 3/9 depending on labs. Tolerated HD in recliner on 3/5, reportedly had slight infiltration when shifting to get on bed pain but fistula looks ok this AM. K+ controlled. 3. HTN/volume:BPlow, currently on metoprololfor rate control+ midodrine.CXR 2/24and 2/28withpersistent pulm edemaand patient still on O2.BP dropping with HD despite extra midodrine dose, limiting UF.UF as tolerated.   4. Anemia:last Hgb12.7.- no ESA needed. 5. Secondary hyperparathyroidism:Caat goal, phosvariable, 2.6 on 3/5.No binders/VDRA. 6. Hx cardiac arrest 7. Hx PE: On Eliquis 8. A-fib: On Eliquis + amiodarone 9. Hx pericardial effusion:colchicine +prednisone 10mg  QD. 10. Anxiety + lethargy: Meds per primary. 11. HFrEF (<20%) 12. 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 On gabapentin 100mg  TID which is an appropriate renal dose, caution using higher doses. 14. Constipation: large stool burdern on KUB 3/5. Refused enema and was given mag citrate. No abdominal pain at present. Recommend minimizing magnesium containing products as able in ESRD patients.   Anice Paganini, PA-C 02/18/2020, 12:13 PM  [facility] Pager: [phone]  Nephrology attending: Patient was seen and examined at bedside.  Chart reviewed.  I agree with assessment and plan as outlined above.  ESRD on HD currently ongoing inpatient rehab.  Please avoid magnesium or phosphate-containing edema because of ESRD.  Dulcolax, MiraLAX or lactulose can be used as needed for constipation.  Plan for next HD on 3/8.  Katheran James, MD [facility].

## 2020-02-19 ENCOUNTER — Inpatient Hospital Stay (HOSPITAL_COMMUNITY): Payer: Medicare Other | Admitting: Physical Therapy

## 2020-02-19 MED ORDER — DM-GUAIFENESIN ER 30-600 MG PO TB12
1.0000 | ORAL_TABLET | Freq: Two times a day (BID) | ORAL | Status: DC
  Administered 2020-02-19 – 2020-02-27 (×17): 1 via ORAL
  Filled 2020-02-19 (×17): qty 1

## 2020-02-19 MED ORDER — CHLORHEXIDINE GLUCONATE CLOTH 2 % EX PADS: 6.0000 | MEDICATED_PAD | Freq: Every day | CUTANEOUS | Status: DC

## 2020-02-19 NOTE — Progress Notes (Signed)
Physical Therapy Session Note  Patient Details  Name: [name] [name] MRN: [id number] Date of Birth: 11/29/1966  Today's Date: 02/19/2020 PT Individual Time: 0902-1016 PT Individual Time Calculation (min): 74 min   Short Term Goals: Week 1:  PT Short Term Goal 1 (Week 1): Pt will transfer to and from [facility] with min assist. PT Short Term Goal 1 - Progress (Week 1): Met PT Short Term Goal 2 (Week 1): Pt will ambulate 61f with min assist and RW PT Short Term Goal 2 - Progress (Week 1): Progressing toward goal PT Short Term Goal 3 (Week 1): Pt will attend to task >5 mintues with only min cues from PT PT Short Term Goal 3 - Progress (Week 1): Met PT Short Term Goal 4 (Week 1): Pt will performed bed mobility with CGA PT Short Term Goal 4 - Progress (Week 1): Met  Skilled Therapeutic Interventions/Progress Updates:  Pt was seen bedside in the am sitting up in bedside chair. Pt's LE wrapped with ace wraps to assist with BP. BP sitting up in chair 11/81, HR 93 and O2 sat 92% on 2 liter nasal canula. Pt performed multiple sit to stand transfers with S and rolling walker as pt fatigued occasionally required more than one attempt to stand or verbal cues for technique. Increased work of breathing noted with ambulation. Pt ambulated 30, 70 and 125 x 2 with rolling walker and S with increased rest breaks between walks. At end of treatment ace wraps removed and pt left sitting up in recliner with leg elevated and call bell within reach.   Therapy Documentation Precautions:  Precautions Precautions: Fall Precaution Comments: Peg, Darco shoes Restrictions Weight Bearing Restrictions: No General:   Pain: No c/o pain.   Therapy/Group: Individual Therapy  MDub Amis3/06/2020, 12:14 PM

## 2020-02-19 NOTE — Progress Notes (Signed)
[facility] PHYSICAL MEDICINE & REHABILITATION PROGRESS NOTE  Subjective/Complaints: Looking and feeling much better this morning. No longer complains of SOB. Complains of some phlegm in throat. Slight cough. Denies throat pain. Refused enema and mag citrate but was willing to take Senna and Docusate. Denies abdominal pain.  ROS: Patient denies fever, rash, sore throat, blurred vision, nausea, vomiting, diarrhea, cough, shortness of breath or chest pain, joint or back pain, headache, or mood change.    Objective: Vital Signs: Blood pressure 102/83, pulse 99, temperature 98.1 F (36.7 C), temperature source Axillary, resp. rate 18, height 5' 10.5" (1.791 m), weight 64.6 kg, SpO2 92 %. DG Abd 1 View  Result Date: 02/17/2020 CLINICAL DATA:  Abdominal pain, constipation EXAM: ABDOMEN - 1 VIEW COMPARISON:  None. FINDINGS: There is a gastrostomy tube present. Large amount of mottled material distending the stomach which may reflect large amount of inspissated post feed residuals. Moderate amount of stool in the ascending colon. There is no evidence of pneumoperitoneum, portal venous gas or pneumatosis. There are no pathologic calcifications along the expected course of the ureters. The osseous structures are unremarkable. IMPRESSION: Large amount of mottled material distending the stomach which may reflect large amount of inspissated post feed residuals. Electronically Signed   By: Kathreen Devoid   On: 02/17/2020 10:43   Recent Labs    02/17/20 1606  WBC 8.6  HGB 12.7*  HCT 40.4  PLT 254   Recent Labs    02/17/20 1606  NA 136  K 3.7  CL 95*  CO2 25  GLUCOSE 102*  BUN 40*  CREATININE 3.05*  CALCIUM 8.6*    Physical Exam: BP 102/83   Pulse 99   Temp 98.1 F (36.7 C) (Axillary)   Resp 18   Ht 5' 10.5" (1.791 m)   Wt 64.6 kg   SpO2 92%   BMI 20.14 kg/m  Constitutional: In distress . Vital signs reviewed.  HEENT: EOMI, oral membranes moist Neck: supple Cardiovascular: RRR  without murmur. No JVD    Respiratory/Chest: CTA Bilaterally without wheezes or rales. Breathing much more comfortably. GI/Abdomen: BS +, sl bloody drainage around PEG, distended, tender  Skin:   Bilateral toes gangrenous, right more involved than left, esp great toe still. No changes. Sl edema in feet ongoing Psych: flat but engages Musc: No edema in extremities.  No tenderness in extremities. Neurological: alert, dysarthric speech. Processing better. Better awareness Motor: 4- to /5 in all 4's with pain inhibition at the feet  Assessment/Plan: 1. Functional deficits secondary to encephalopathy which require 3+ hours per day of interdisciplinary therapy in a comprehensive inpatient rehab setting.  Physiatrist is providing close team supervision and 24 hour management of active medical problems listed below.  Physiatrist and rehab team continue to assess barriers to discharge/monitor patient progress toward functional and medical goals  Care Tool:  Bathing  Bathing activity did not occur: Refused Body parts bathed by patient: Buttocks, Front perineal area   Body parts bathed by helper: Abdomen, Right lower leg, Left lower leg     Bathing assist Assist Level: Supervision/Verbal cueing     Upper Body Dressing/Undressing Upper body dressing Upper body dressing/undressing activity did not occur (including orthotics): N/A What is the patient wearing?: Pull over shirt    Upper body assist Assist Level: Moderate Assistance - Patient 50 - 74%    Lower Body Dressing/Undressing Lower body dressing    Lower body dressing activity did not occur: N/A What is the patient wearing?: Underwear/pull  up, Pants     Lower body assist Assist for lower body dressing: Moderate Assistance - Patient 50 - 74%     Toileting Toileting    Toileting assist Assist for toileting: Minimal Assistance - Patient > 75%     Transfers Chair/bed transfer  Transfers assist     Chair/bed transfer  assist level: Supervision/Verbal cueing     Locomotion Ambulation   Ambulation assist   Ambulation activity did not occur: N/A  Assist level: Supervision/Verbal cueing Assistive device: Walker-rolling Max distance: 170 ft   Walk 10 feet activity   Assist     Assist level: Supervision/Verbal cueing Assistive device: Walker-rolling   Walk 50 feet activity   Assist    Assist level: Supervision/Verbal cueing Assistive device: Walker-rolling    Walk 150 feet activity   Assist Walk 150 feet activity did not occur: Safety/medical concerns  Assist level: Supervision/Verbal cueing Assistive device: Walker-rolling    Walk 10 feet on uneven surface  activity   Assist Walk 10 feet on uneven surfaces activity did not occur: Safety/medical concerns         Wheelchair     Assist Will patient use wheelchair at discharge?: Yes Type of Wheelchair: Manual Wheelchair activity did not occur: N/A  Wheelchair assist level: Minimal Assistance - Patient > 75% Max wheelchair distance: 150 ft    Wheelchair 50 feet with 2 turns activity    Assist        Assist Level: Minimal Assistance - Patient > 75%   Wheelchair 150 feet activity     Assist     Assist Level: Minimal Assistance - Patient > 75%      Medical Problem List and Plan: 1.  Deficits with mobility, endurance, self-care secondary to encephalopathy with debility, PVD with gangrenous toes.  -Continue CIR therapies including PT, OT, and SLP   -pt with baseline cognitive deficits  -ELOS 3/9 2.  Antithrombotics: -PE/anticoagulation:  Pharmaceutical: Other (comment)--on low dose Eliquis             -antiplatelet therapy:  N/A 3. Pain Management: Oxycodone or tramadol prn.   2/24: Therapist notes that Mr. Werts has been having hypersensitivity of his gangrenous toes. Will add low dose Gabapentin 100mg  TID to help with pain-- low dose due to ESRD.  2/28 continue gabapentin---seems to be  tolerating so far  3/1: Pain is well controlled.   3/3: gabapentin increased slightly for foot/toe pain.  3/5  Foot/toe pain seems better controlled   3/7: well controlled.  4. Mood: LCSW to follow for evaluation and support.    See #14             -antipsychotic agents: N/A   See #14 5. Neuropsych: This patient is not fully capable of making decisions on his own behalf. 6. Skin/Wound Care: Dry dressing to bilateral feet.              trach stoma closed. PEG site intact   7. Fluids/Electrolytes/Nutrition:   2/23 stopped TF  -monitor intake.   8. A fib/NSVT: On amiodarone and metoprolol for rate control.              Rate in 90's on 2/25   -isolated bradycardia yesterday   3/2--3/3: rates remain in 90's   -see #9.   3/6: rate in 80s  Monitor with increased activity.  -intermittent SOB, CXR demonstrates CHF. Nephrology working on volume mgt with HD 9.  ESRD: HD MWF at the end of the day  to help with activity tolerance.   -volume/weight mgt per nephrology,    3/3 weights stable  3/6: weight decreased Filed Weights   02/17/20 1826 02/18/20 0455 02/19/20 0337  Weight: 67.1 kg 64.5 kg 64.6 kg  10. Pericardial effusion: Has been on colchicine since 1/19 and has been weaned to prednisone 10 mg daily. Last echo 1/29 shows decrease to small effusion.  3/7: Yesterday evening he was complaining of increased shortness of breath and his breathing appeared more labored on examination yesterday. HD has not been able to remove as much fluid due to his hypotension. Gave 20mg  Lasix and his breathing is much improved today, appears comfortably. EKG obtained to assess qTC given Lasix administration, qTC is 432, much improved from prior.  11. Hypotension: Continue midodrine 10 mg tid.  BP's still soft 2/26, 3/1, 3/3. A little better 3/4  3/6: continue to be very soft.  12. Dysphagia/PEG:   Eating fairly well now  2/26- had MBS ---upgraded to regular/thins  3/5: abdomen distended today with mild  discomfort. has had two medium bm's over last 2 days.    -check kub given distention   -advised patient that he may have some discharge from around PEG. Change dressings as needed  3/6: likely having loose stools around constipated mass; KUB shows retained stool in bowel and questionable undigested food in stomach. Refused enema. Provided education about why he needs it but continues to refuse. Will order mag citrate to which he is agreeable.   3/7: refused mag citrate and enema yesterday. He is willing to take Senna HS and Docusate TID.  13. Dry gangrene bilateral feet: Needs to wear shoes for protection. Dry dressing. Maintain adequate nutritional status.    -he is having ongoing pain in feet, right more than left.   -both feet cool, especially right  -wounds appear stable to improved but demonstrate ongoing gangrene as well  3/2-4 -vascular following patient. May need toe amps eventually  -continue local care, pain mgt  3/6: Patient continues to pick at wounds despite education not to.  14. Anxiety/depression: On Zoloft 25 mg/day.  Team support.  ECG with prolonged QTC 512  Continue melatonin  Klonopin increased  1 mg 3 times daily 15. Insomnia: Per father, he sleeps late at night at baseline.   -2/27 still having difficulties more often than not at night d/t confusion/agitation/anxiety   -resumed seroquel 25mg  qhs   -QTC actually was DECREASED last week after giving seroquel the other day.       3/2 sleep generally improved   -has baseline insomnia   -pain and anxiety often inhibit sleep   -QTC further decreased on  EKG   16. Phlegm with slight cough: 3/7 started Mucinex.    LOS: 16 days A FACE TO FACE EVALUATION WAS PERFORMED  Clide Deutscher Khayman Kirsch 02/19/2020, 9:29 AM

## 2020-02-19 NOTE — Progress Notes (Addendum)
[facility] Progress Note   Subjective:   Patient seen in room, father at bedside. Reports SOB yesterday, improved today but still reporting orthopnea and "phlegm" in throat. Denies CP, palpitations, dizziness, abdominal pain, N/V/D. Foot pain improved.  Objective Vitals:   02/18/20 1759 02/18/20 2018 02/19/20 0337 02/19/20 0854  BP: 101/90 106/80 101/87 102/83  Pulse: 99 95 (!) 58 99  Resp:  16 18   Temp:  97.7 F (36.5 C) 98.1 F (36.7 C)   TempSrc:   Axillary   SpO2: 97% 93% 92%   Weight:   64.6 kg   Height:       Physical Exam General: Well developed, chronically ill appearing male, alert and in NAD Heart: RRR, no murmurs, rubs or gallops Lungs: Decreased breath sounds bilateral bases. No wheezing, rhonchi or rales Abdomen: Soft, non-tender, non-distended, +BS Extremities: trace edema bilateral lower extremities Dialysis Access: LUE AVF + bruit  Additional Objective Labs: Basic Metabolic Panel: Recent Labs  Lab 02/14/20 1127 02/15/20 1310 02/17/20 1606  NA 136 133* 136  K 4.1 4.9 3.7  CL 92* 91* 95*  CO2 26 23 25   GLUCOSE 127* 112* 102*  BUN 77* 112* 40*  CREATININE 5.34* 6.75* 3.05*  CALCIUM 8.7* 8.7* 8.6*  PHOS 3.6 4.9* 2.6   Liver Function Tests: Recent Labs  Lab 02/14/20 1127 02/15/20 1310 02/17/20 1606  ALBUMIN 3.5 3.6 3.9   CBC: Recent Labs  Lab 02/13/20 0847 02/15/20 1311 02/17/20 1606  WBC 6.8 7.4 8.6  HGB 12.3* 12.4* 12.7*  HCT 40.8 40.6 40.4  MCV 90.3 88.6 87.3  PLT 196 229 254   Blood Culture    Component Value Date/Time   SDES BLOOD RIGHT HAND 12/17/2019 1520   SPECREQUEST  12/17/2019 1520    BOTTLES DRAWN AEROBIC ONLY Blood Culture results may not be optimal due to an inadequate volume of blood received in culture bottles   CULT  12/17/2019 1520    NO GROWTH 5 DAYS Performed at [facility], [place] [address], [place], [place] [address]    REPTSTATUS 12/22/2019 FINAL 12/17/2019 1520    Medications:  .  amiodarone  200 mg Oral Daily  . apixaban  2.5 mg Oral BID  . ascorbic acid  500 mg Oral BID  . benztropine  1 mg Oral BID  . Chlorhexidine Gluconate Cloth  6 each Topical Q0600  . clonazePAM  1 mg Oral TID WC  . colchicine  0.6 mg Oral Daily  . dextromethorphan-guaiFENesin  1 tablet Oral BID  . docusate  100 mg Oral TID  . feeding supplement (PRO-STAT SUGAR FREE 64)  30 mL Oral BID  . folic acid  1 mg Oral Daily  . free water  100 mL Per Tube BID  . gabapentin  100 mg Oral Q8H  . levothyroxine  25 mcg Oral Q0600  . magnesium citrate  1 Bottle Oral Once  . mouth rinse  15 mL Mouth Rinse BID  . Melatonin  3 mg Oral QHS  . metoprolol tartrate  25 mg Oral TID WC  . midodrine  10 mg Oral TID WC  . multivitamin  1 tablet Oral QHS  . predniSONE  10 mg Oral Q breakfast  . QUEtiapine  25 mg Oral QHS  . senna  2 tablet Oral QHS  . thiamine  100 mg Oral Daily    Dialysis Orders: Prev orders - will need to change significantly on d/c MWF at [facility], 4:15hr, EDW 74.5kg, 2K/2.5Ca,  AVF, heparin 5K - Calcitriol 0.35mcg PO q HD. No ESA or venofer.  Assessment/Plan: 1.Deconditioning: S/p prolonged [facility] admit (12/2-12/23/19) s/p cardiac arrest, then [facility] until 2/21. Now CIR. 2. ESRD:On MWF schedulebut will be changing to TTS at discharge, volume up today, will plan extra HD tomorrow before changing to TTS schedule. Tolerated HD in recliner on 3/5.K+ controlled. 3. HTN/volume:BPlow, currently on metoprololfor rate control+ midodrine.CXR 2/24and 2/28withpersistent pulm edemaand patient still on O2.Patient does appear volume overloaded today. Will attempt UFG 2.5-3L tomorrow then resume TTS schedule. BP has been dropping with HD despite extra midodrine dose, limiting UF. 4. Anemia:lastHgb12.7.- no ESA needed. 5. Secondary hyperparathyroidism:Caat goal, phosvariable, 2.6 on 3/5.No binders/VDRA. 6. Hx cardiac arrest 7. Hx PE: On Eliquis 8. A-fib: On Eliquis + amiodarone 9.  Hx pericardial effusion:colchicine +prednisone 10mg  QD. 10. Anxiety + lethargy: Meds per primary. 11. HFrEF (<20%) 12. 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 On gabapentin 100mg  TID which is an appropriate renal dose, caution using higher doses. 14. Constipation: large stool burdern on KUB 3/5. Refused enema and was given mag citrate. No abdominal pain at present. Recommend avoiding magnesium containing products as able in ESRD patients.    Anice Paganini, PA-C 02/19/2020, 10:45 AM  [facility] Pager: [phone]  Nephrology attending: Patient was seen and examined at bedside.  Chart reviewed.  I agree with assessment and plan as outlined above. ESRD on HD with prolonged hospitalization after cardiac arrest now inpatient rehab.  He has outpatient HD arranged for TTS schedule therefore I agree with extra treatment tomorrow and then back to TTS schedule.  Katheran James, MD [facility].

## 2020-02-19 NOTE — Plan of Care (Signed)
  Problem: Consults Goal: RH GENERAL PATIENT EDUCATION Description: See Patient Education module for education specifics. Outcome: Progressing   Problem: RH SKIN INTEGRITY Goal: RH STG SKIN FREE OF INFECTION/BREAKDOWN Description: Free of breakdown and infection with mod assist Outcome: Progressing Goal: RH STG ABLE TO PERFORM INCISION/WOUND CARE W/ASSISTANCE Description: STG Able To Perform Incision/Wound Care With Assistance. Mod Outcome: Progressing   Problem: RH SAFETY Goal: RH STG ADHERE TO SAFETY PRECAUTIONS W/ASSISTANCE/DEVICE Description: STG Adhere to Safety Precautions With Assistance/Device. Mod Outcome: Progressing   Problem: RH BOWEL ELIMINATION Goal: RH STG MANAGE BOWEL WITH ASSISTANCE Description: STG Manage Bowel with Assistance. mod Outcome: Not Progressing   Problem: RH PAIN MANAGEMENT Goal: RH STG PAIN MANAGED AT OR BELOW PT'S PAIN GOAL Description: Less than 4 Outcome: Not Progressing

## 2020-02-20 ENCOUNTER — Inpatient Hospital Stay (HOSPITAL_COMMUNITY): Payer: Medicare Other | Admitting: Occupational Therapy

## 2020-02-20 ENCOUNTER — Inpatient Hospital Stay (HOSPITAL_COMMUNITY): Payer: Medicare Other | Admitting: Speech Pathology

## 2020-02-20 ENCOUNTER — Inpatient Hospital Stay (HOSPITAL_COMMUNITY): Payer: Medicare Other

## 2020-02-20 LAB — COMPREHENSIVE METABOLIC PANEL
ALT: 63 U/L — ABNORMAL HIGH (ref 0–44)
AST: 61 U/L — ABNORMAL HIGH (ref 15–41)
Albumin: 3.7 g/dL (ref 3.5–5.0)
Alkaline Phosphatase: 148 U/L — ABNORMAL HIGH (ref 38–126)
Anion gap: 27 — ABNORMAL HIGH (ref 5–15)
BUN: 118 mg/dL — ABNORMAL HIGH (ref 6–20)
CO2: 16 mmol/L — ABNORMAL LOW (ref 22–32)
Calcium: 8.7 mg/dL — ABNORMAL LOW (ref 8.9–10.3)
Chloride: 90 mmol/L — ABNORMAL LOW (ref 98–111)
Creatinine, Ser: 7.95 mg/dL — ABNORMAL HIGH (ref 0.61–1.24)
GFR calc Af Amer: 8 mL/min — ABNORMAL LOW (ref >60)
GFR calc non Af Amer: 7 mL/min — ABNORMAL LOW (ref >60)
Glucose, Bld: 67 mg/dL — ABNORMAL LOW (ref 70–99)
Potassium: 7.3 mmol/L (ref 3.5–5.1)
Sodium: 133 mmol/L — ABNORMAL LOW (ref 135–145)
Total Bilirubin: 1.4 mg/dL — ABNORMAL HIGH (ref 0.3–1.2)
Total Protein: 7.3 g/dL (ref 6.5–8.1)

## 2020-02-20 LAB — CBC WITH DIFFERENTIAL/PLATELET
Abs Immature Granulocytes: 0.41 10*3/uL — ABNORMAL HIGH (ref 0.00–0.07)
Basophils Absolute: 0.1 10*3/uL (ref 0.0–0.1)
Basophils Relative: 0 %
Eosinophils Absolute: 0 10*3/uL (ref 0.0–0.5)
Eosinophils Relative: 0 %
HCT: 46.1 % (ref 39.0–52.0)
Hemoglobin: 13.9 g/dL (ref 13.0–17.0)
Immature Granulocytes: 3 %
Lymphocytes Relative: 8 %
Lymphs Abs: 1.2 10*3/uL (ref 0.7–4.0)
MCH: 27.6 pg (ref 26.0–34.0)
MCHC: 30.2 g/dL (ref 30.0–36.0)
MCV: 91.7 fL (ref 80.0–100.0)
Monocytes Absolute: 1.4 10*3/uL — ABNORMAL HIGH (ref 0.1–1.0)
Monocytes Relative: 9 %
Neutro Abs: 12.7 10*3/uL — ABNORMAL HIGH (ref 1.7–7.7)
Neutrophils Relative %: 80 %
Platelets: 293 10*3/uL (ref 150–400)
RBC: 5.03 MIL/uL (ref 4.22–5.81)
RDW: 22.5 % — ABNORMAL HIGH (ref 11.5–15.5)
WBC: 15.8 10*3/uL — ABNORMAL HIGH (ref 4.0–10.5)
nRBC: 3 % — ABNORMAL HIGH (ref 0.0–0.2)

## 2020-02-20 LAB — GLUCOSE, CAPILLARY
Glucose-Capillary: 99 mg/dL (ref 70–99)
Glucose-Capillary: 84 mg/dL (ref 70–99)

## 2020-02-20 LAB — HEPATITIS B SURFACE ANTIGEN: Hepatitis B Surface Ag: NONREACTIVE

## 2020-02-20 LAB — AMMONIA: Ammonia: 27 umol/L (ref 9–35)

## 2020-02-20 MED ORDER — GLUCOSE 40 % PO GEL
1.0000 | Freq: Once | ORAL | Status: AC
  Administered 2020-02-20: 37.5 g via ORAL
  Filled 2020-02-20: qty 1

## 2020-02-20 MED ORDER — CLONAZEPAM 0.5 MG PO TABS
0.5000 mg | ORAL_TABLET | Freq: Three times a day (TID) | ORAL | Status: DC
  Administered 2020-02-20 – 2020-02-21 (×2): 0.5 mg via ORAL
  Filled 2020-02-20 (×3): qty 1

## 2020-02-20 MED ORDER — SODIUM ZIRCONIUM CYCLOSILICATE 10 G PO PACK: 10.0000 g | PACK | Freq: Once | ORAL | Status: DC

## 2020-02-20 MED ORDER — IOHEXOL 300 MG/ML  SOLN
100.0000 mL | Freq: Once | INTRAMUSCULAR | Status: AC | PRN
  Administered 2020-02-20: 100 mL via INTRAVENOUS
  Filled 2020-02-20: qty 100

## 2020-02-20 MED ORDER — PIPERACILLIN-TAZOBACTAM IN DEX 2-0.25 GM/50ML IV SOLN
2.2500 g | Freq: Three times a day (TID) | INTRAVENOUS | Status: AC
  Administered 2020-02-20 – 2020-02-26 (×17): 2.25 g via INTRAVENOUS
  Filled 2020-02-20 (×19): qty 50

## 2020-02-20 MED ORDER — CHLORHEXIDINE GLUCONATE CLOTH 2 % EX PADS: 6.0000 | MEDICATED_PAD | Freq: Every day | CUTANEOUS | Status: DC

## 2020-02-20 NOTE — Progress Notes (Signed)
Speech Language Pathology Daily Session Note  Patient Details  Name: [name] [name] MRN: [id number] Date of Birth: Aug 29, 1966  Today's Date: 02/20/2020 SLP Individual Time: 0930-1025 SLP Individual Time Calculation (min): 55 min  Short Term Goals: Week 3: SLP Short Term Goal 1 (Week 3): STGs=LTGs due ELOS  Skilled Therapeutic Interventions: Skilled treatment session focused on cognitive goals. Upon arrival, patient appeared lethargic while upright in the wheelchair. RN present and obtained patient's blood pressure. Patient's blood pressure appeared slightly higher than normal, but RN unable to obtain a manual pressure. PA aware. Patient unable to maintain arousal for more than 60 seconds at a time and required Max encouragement for PO intake. Patient consumed 4 oz of thin liquids without overt s/s of aspiration be declined solids. Suspect due to distention of his abdomen, PA aware. PA also requested to remove supplemental O2, however, saturations were at 85% so O2 was reapplied. [name] present towards end of session and educated on current swallowing function, diet recommendations, medication administration and compensatory strategies. He verbalized understanding. Patient left upright in wheelchair with all needs within reach and father present. Continue with current plan of care.      Pain No/Denies Pain   Therapy/Group: Individual Therapy  Demetra Moya 02/20/2020, 3:10 PM

## 2020-02-20 NOTE — Plan of Care (Signed)
  Problem: RH BOWEL ELIMINATION Goal: RH STG MANAGE BOWEL WITH ASSISTANCE Description: STG Manage Bowel with Assistance. mod Outcome: Progressing   Problem: RH SKIN INTEGRITY Goal: RH STG SKIN FREE OF INFECTION/BREAKDOWN Description: Free of breakdown and infection with mod assist Outcome: Progressing Goal: RH STG ABLE TO PERFORM INCISION/WOUND CARE W/ASSISTANCE Description: STG Able To Perform Incision/Wound Care With Assistance. Mod Outcome: Not Progressing

## 2020-02-20 NOTE — Progress Notes (Signed)
[facility] PHYSICAL MEDICINE & REHABILITATION PROGRESS NOTE  Subjective/Complaints:  Pt reports head "spinning"- but when nursing came in, said his head wasn't spinning.  Per CNA, he isn't eating well- 1-3 bites, then done.   Pt had HD Saturday.  Denies any issues.   Refused enema over weekend and again this AM. Even though abd distended, said it's been distended like that for "months".        ROS: Pt denies SOB, CP, N/V/C/D however nursing said LBM Saturday.   Objective: Vital Signs: Blood pressure (!) 125/113, pulse 65, temperature (!) 96.6 F (35.9 C), temperature source Axillary, resp. rate 20, height 5' 10.5" (1.791 m), weight 69.8 kg, SpO2 96 %. No results found. Recent Labs    02/17/20 1606  WBC 8.6  HGB 12.7*  HCT 40.4  PLT 254   Recent Labs    02/17/20 1606  NA 136  K 3.7  CL 95*  CO2 25  GLUCOSE 102*  BUN 40*  CREATININE 3.05*  CALCIUM 8.6*    Physical Exam: BP (!) 125/113 (BP Location: Right Arm)   Pulse 65 Comment: manually rechecked  Temp (!) 96.6 F (35.9 C) (Axillary)   Resp 20   Ht 5' 10.5" (1.791 m)   Wt 69.8 kg   SpO2 96%   BMI 21.77 kg/m  Constitutional: sitting up in bedside chair; head lolling around, very sleepy initially; then woke up; speaking sporadically, NAD HEENT: EOMI, oral membranes moist Neck: supple Cardiovascular: RRR Respiratory/Chest: CTA B/L- no accessory muscle use GI/Abdomen: hypoactive BS; moderately distended; NT;  Dark brownish/reddish bloody drainage- like from hematoma- draining around PEG- not in PEG tube Skin:   B/L toes gangrenous Psych: very flat; sleepy initially Musc: No edema in extremities.  No tenderness in extremities. Neurological: alert, dysarthric speech. Processing better. Better awareness Motor: 4- to /5 in all 4's with pain inhibition at the feet  Assessment/Plan: 1. Functional deficits secondary to encephalopathy which require 3+ hours per day of interdisciplinary therapy in a  comprehensive inpatient rehab setting.  Physiatrist is providing close team supervision and 24 hour management of active medical problems listed below.  Physiatrist and rehab team continue to assess barriers to discharge/monitor patient progress toward functional and medical goals  Care Tool:  Bathing  Bathing activity did not occur: Refused Body parts bathed by patient: Buttocks, Front perineal area   Body parts bathed by helper: Abdomen, Right lower leg, Left lower leg     Bathing assist Assist Level: Supervision/Verbal cueing     Upper Body Dressing/Undressing Upper body dressing Upper body dressing/undressing activity did not occur (including orthotics): N/A What is the patient wearing?: Pull over shirt    Upper body assist Assist Level: Moderate Assistance - Patient 50 - 74%    Lower Body Dressing/Undressing Lower body dressing    Lower body dressing activity did not occur: N/A What is the patient wearing?: Underwear/pull up, Pants     Lower body assist Assist for lower body dressing: Moderate Assistance - Patient 50 - 74%     Toileting Toileting    Toileting assist Assist for toileting: Minimal Assistance - Patient > 75%     Transfers Chair/bed transfer  Transfers assist     Chair/bed transfer assist level: Supervision/Verbal cueing     Locomotion Ambulation   Ambulation assist   Ambulation activity did not occur: N/A  Assist level: Supervision/Verbal cueing Assistive device: Walker-rolling Max distance: 125   Walk 10 feet activity   Assist  Assist level: Supervision/Verbal cueing Assistive device: Walker-rolling   Walk 50 feet activity   Assist    Assist level: Supervision/Verbal cueing Assistive device: Walker-rolling    Walk 150 feet activity   Assist Walk 150 feet activity did not occur: Safety/medical concerns  Assist level: Supervision/Verbal cueing Assistive device: Walker-rolling    Walk 10 feet on uneven surface   activity   Assist Walk 10 feet on uneven surfaces activity did not occur: Safety/medical concerns         Wheelchair     Assist Will patient use wheelchair at discharge?: Yes Type of Wheelchair: Manual Wheelchair activity did not occur: N/A  Wheelchair assist level: Minimal Assistance - Patient > 75% Max wheelchair distance: 150 ft    Wheelchair 50 feet with 2 turns activity    Assist        Assist Level: Minimal Assistance - Patient > 75%   Wheelchair 150 feet activity     Assist     Assist Level: Minimal Assistance - Patient > 75%      Medical Problem List and Plan: 1.  Deficits with mobility, endurance, self-care secondary to encephalopathy with debility, PVD with gangrenous toes.  -Continue CIR therapies including PT, OT, and SLP   -pt with baseline cognitive deficits  -ELOS 3/9 2.  Antithrombotics: -PE/anticoagulation:  Pharmaceutical: Other (comment)--on low dose Eliquis             -antiplatelet therapy:  N/A 3. Pain Management: Oxycodone or tramadol prn.   2/24: Therapist notes that Mr. Waymire has been having hypersensitivity of his gangrenous toes. Will add low dose Gabapentin 100mg  TID to help with pain-- low dose due to ESRD.  2/28 continue gabapentin---seems to be tolerating so far  3/1: Pain is well controlled.   3/3: gabapentin increased slightly for foot/toe pain.  3/5  Foot/toe pain seems better controlled   3/7: well controlled.  4. Mood: LCSW to follow for evaluation and support.    See #14             -antipsychotic agents: N/A   See #14 5. Neuropsych: This patient is not fully capable of making decisions on his own behalf. 6. Skin/Wound Care: Dry dressing to bilateral feet.              trach stoma closed. PEG site intact   7. Fluids/Electrolytes/Nutrition:   2/23 stopped TF  -monitor intake.   8. A fib/NSVT: On amiodarone and metoprolol for rate control.              Rate in 90's on 2/25   -isolated bradycardia yesterday    3/2--3/3: rates remain in 90's   -see #9.   3/6: rate in 80s  Monitor with increased activity.  -intermittent SOB, CXR demonstrates CHF. Nephrology working on volume mgt with HD 9.  ESRD: HD MWF at the end of the day to help with activity tolerance.   -volume/weight mgt per nephrology,    3/3 weights stable  3/6: weight decreased Filed Weights   02/18/20 0455 02/19/20 0337 02/20/20 0500  Weight: 64.5 kg 64.6 kg 69.8 kg  10. Pericardial effusion: Has been on colchicine since 1/19 and has been weaned to prednisone 10 mg daily. Last echo 1/29 shows decrease to small effusion.  3/7: Yesterday evening he was complaining of increased shortness of breath and his breathing appeared more labored on examination yesterday. HD has not been able to remove as much fluid due to his hypotension. Gave 20mg  Lasix  and his breathing is much improved today, appears comfortably. EKG obtained to assess qTC given Lasix administration, qTC is 432, much improved from prior.  3/8- Weight up 10 lbs in 1 day- will follow trend, since 10 lbs change unlikely.  11. Hypotension: Continue midodrine 10 mg tid.  BP's still soft 2/26, 3/1, 3/3. A little better 3/4  3/6: continue to be very soft.   3/8- BP 125/113- 120/94- BP doing better -con't regimen. 12. Dysphagia/PEG:   Eating fairly well now  2/26- had MBS ---upgraded to regular/thins  3/5: abdomen distended today with mild discomfort. has had two medium bm's over last 2 days.    -check kub given distention   -advised patient that he may have some discharge from around PEG. Change dressings as needed  3/6: likely having loose stools around constipated mass; KUB shows retained stool in bowel and questionable undigested food in stomach. Refused enema. Provided education about why he needs it but continues to refuse. Will order mag citrate to which he is agreeable.   3/7: refused mag citrate and enema yesterday. He is willing to take Senna HS and Docusate TID.   3/8- pt  having dark brown/red bloody drainage around PEG_ like from hematoma- will have IR take a look at it today. LBM Saturday. Still refusing bowel meds. Not eating well, likely due to distension- refuses bowel meds, so have no way to fix distension. 13. Dry gangrene bilateral feet: Needs to wear shoes for protection. Dry dressing. Maintain adequate nutritional status.    -he is having ongoing pain in feet, right more than left.   -both feet cool, especially right  -wounds appear stable to improved but demonstrate ongoing gangrene as well  3/2-4 -vascular following patient. May need toe amps eventually  -continue local care, pain mgt  3/6: Patient continues to pick at wounds despite education not to.  14. Anxiety/depression: On Zoloft 25 mg/day.  Team support.  ECG with prolonged QTC 512  Continue melatonin  Klonopin increased  1 mg 3 times daily 15. Insomnia: Per father, he sleeps late at night at baseline.   -2/27 still having difficulties more often than not at night d/t confusion/agitation/anxiety   -resumed seroquel 25mg  qhs   -QTC actually was DECREASED last week after giving seroquel the other day.       3/2 sleep generally improved   -has baseline insomnia   -pain and anxiety often inhibit sleep   -QTC further decreased on  EKG   16. Phlegm with slight cough: 3/7 started Mucinex. 17. Is supposed to leave tomorrow- not sure able to- will check with PA/IR to see if can fix it prior to d/c.    LOS: 17 days A FACE TO FACE EVALUATION WAS PERFORMED  Sirinity Outland 02/20/2020, 9:07 AM

## 2020-02-20 NOTE — Progress Notes (Signed)
Medical Consultation   [name]  IZT:[id number]  DOB: May 21, 1966  DOA: 02/03/2020  PCP: Jean Rosenthal, MD   Requesting physician: Olin Hauser love  Reason for consultation: Acute encephalopathy/abdominal distention   History of Present Illness: [name] [name] is an 54 y.o. 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  He required long-term ventilation with weaning from vent.  Hospital service was consulted for new onset of altered mental status and abdominal distention.  He was discharged by P[facility]M on 12/23/2019 [facility] and currently has been in rehabilitation for several weeks.  Hospital service was called regarding patient's altered mental status that has been going on since this morning and abdominal distention that has been going on since about 3 days.  Patient seen and examined in his room.  His primary RN was present at the bedside.  Patient was too lethargic to hold conversation however he was able to answer a few questions.  He denied any complaint but then endorsed having some shortness of breath and abdominal pain upon asking.  Per RN, patient did not have a bowel movement on Friday so several medications were ordered for him which he had refused which included Colace, senna and also refused enema and mag citrate.  Apparently, he has been having liquidy stools but with increased discomfort and has not been eating well.  I was also told that currently he is on 2 L of nasal correction which has been placed since Friday but looking back in the chart, he  seems to be requiring oxygen on and off.  He also has a PEG tube which was draining serosanguineous fluid and then blood for which he was evaluated by IR today.  G-tube is flushing and this was aspirated without difficulty.  Patient is a stable from IR perspective.  Abdominal x-ray was done on 02/17/2020 which showed large amount of mottled material distending the stomach which may reflect large amount of insufflated post feed residuals.   Review of Systems:  ROS As per HPI otherwise negative  Past Medical History: Past Medical History:  Diagnosis Date  . Abdominal distension 08/10/2018  . Acute on chronic respiratory failure with hypoxia ([place])   . Acute on chronic systolic and diastolic heart failure, NYHA class 4 ([place])   . Acute pulmonary embolism without acute cor pulmonale (HCC)   . Alcohol abuse 11/26/2010   Qualifier: Diagnosis of  By: [name] MD, Pershing Proud  . Anemia due to blood loss, acute 07/15/2012  . CHF (congestive heart failure) ([place])   . Cholecystitis 04/18/2014  . Chronic atrial fibrillation ([place])   . Cigarette smoker    has currently quit  . Cocaine abuse ([place]) 11/26/2010   Qualifier: Diagnosis of  By: [name] MD, Benjamine Mola  has been over a year  . Combined congestive systolic and diastolic heart failure ([place]) 04/18/2014   A. Echo 8/13: Severe LVH, EF 40-45%, inferoposterior akinesis, grade 2 diastolic dysfunction, moderate LAE, mild RVE, mildly reduced RVSF, mild RAE; cannot rule out R atrial mass-suggest TEE or cardiac MRI  //  B. Echo 3/17: Mild LVH, EF  25-30%, diffuse HK, grade 2 diastolic dysfunction, mild MR, severe LAE,  moderately reduced RVSF, severe RAE, mild TR, moderate PI, PASP 55 mmHg    . Dyspnea   . End stage renal disease on dialysis ([place])   . ESRD (end stage renal disease) on dialysis [facility])    MWF- [place] (05/18/2017)  . Hemodialysis patient ([place])    M,W,F  . Hernia, inguinal, right 08/05/2016  . Hypertension   . 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 Hypocalcemia 07/15/2012  . Medical non-compliance   . Non-ischemic cardiomyopathy ([place])    A. R/L HC 3/17: Normal coronary arteries, moderate pulmonary hypertension (PASP 65 mmHg), elevated LV filling pressures (LVEDP 45 mmHg)   . NSVT (nonsustained ventricular tachycardia) ([place]) 02/22/2016  . NSVT (nonsustained ventricular tachycardia) ([place]) 02/22/2016  . Pneumonia 11/12/2017  . Polysubstance abuse ([place])   . Prolonged Q-T interval on ECG 05/18/2017  . Prolonged QT interval 05/18/2017  . Restless leg syndrome   . Solitary pulmonary nodule 10/10/2015   See cxr 10/09/2015 - CT rec 10/10/2015 >>>   . Upper airway cough syndrome 10/09/2015   Off ACEi around 1st Oct 2016  - Sinus CT 10/10/2015 >>>      Past Surgical History: Past Surgical History:  Procedure Laterality Date  . [place] TRANSPOSITION  01/19/2013   Procedure: BASCILIC VEIN TRANSPOSITION;  Surgeon: Conrad Frizzleburg, MD;  Location: [facility];  Service: Vascular;  Laterality: Left;  left 2nd stage basilic vein transposition  . CARDIAC CATHETERIZATION N/A 02/25/2016   Procedure: Right/Left Heart Cath and Coronary Angiography;  Surgeon: Burnell Blanks, MD;  Location: [facility] CV LAB;  Service: Cardiovascular;  Laterality: N/A;  . INGUINAL HERNIA REPAIR Right 08/05/2016   Procedure: RIGHT INGUINAL HERNIA REPAIR WITH MESH;  Surgeon: Donnie Mesa, MD;  Location: [facility];  Service: General;  Laterality: Right;  . INSERTION OF DIALYSIS CATHETER  07/17/2012   Procedure: INSERTION OF DIALYSIS CATHETER;  Surgeon: Conrad Bangor, MD;  Location: [facility];  Service: Vascular;  Laterality: Right;  right internal jugular  . INSERTION OF MESH Right 08/05/2016   Procedure: INSERTION OF MESH;  Surgeon: Donnie Mesa, MD;  Location: [facility];  Service: General;  Laterality: Right;  . IR GASTROSTOMY TUBE MOD SED  01/09/2020  .  IR THORACENTESIS ASP PLEURAL SPACE W/IMG GUIDE  01/12/2020  . REVISION OF ARTERIOVENOUS GORETEX GRAFT Left 9/83/3825   Procedure: PLICATION OF ARTERIOVENOUS FISTULA LEFT ARM;  Surgeon: Elam Dutch, MD;  Location: [facility];  Service: Vascular;  Laterality: Left;  . REVISON OF ARTERIOVENOUS FISTULA Left 01/06/2017   Procedure: REVISON OF ARTERIOVENOUS FISTULA;  Surgeon: Conrad Deenwood, MD;  Location: [facility];  Service: Vascular;  Laterality: Left;  . REVISON OF ARTERIOVENOUS FISTULA Left 10/27/2018   Procedure: REVISION PLICATION OF ARTERIOVENOUS FISTULA  LEFT UPPER ARM;  Surgeon: Waynetta Sandy, MD;  Location: [facility];  Service: Vascular;  Laterality: Left;  . TRACHEOSTOMY TUBE PLACEMENT N/A 11/24/2019   Procedure: TRACHEOSTOMY;  Surgeon: Izora Gala, MD;  Location: [facility];  Service: ENT;  Laterality: N/A;  . UMBILICAL HERNIA REPAIR N/A 08/05/2016   Procedure: [place];  Surgeon: Donnie Mesa, MD;  Location: [facility];  Service: General;  Laterality: N/A;  . VENOGRAM N/A 08/09/2012   Procedure: VENOGRAM;  Surgeon: Conrad Azle, MD;  Location:  [facility] CATH LAB;  Service: Cardiovascular;  Laterality: N/A;     Allergies:   Allergies  Allergen Reactions  . Losartan Cough     Social History:  reports that he quit smoking about 16 months ago. His smoking use included cigarettes. He quit after 30.00 years of use. He has never used smokeless tobacco. He reports current alcohol use of about 4.0 standard drinks of alcohol per week. He reports that he does not use drugs.   Family History: Family History  Problem Relation Age of Onset  . Hypertension Mother   . Diabetes Mother   . Renal Disease Mother   . Hypertension Father      Physical Exam: Vitals:   02/20/20 0500 02/20/20 0629 02/20/20 1010 02/20/20 1137  BP:  (!) 125/113 122/88 97/73  Pulse:  65 (!) 57 63  Resp:  20  16  Temp:  (!) 96.6 F (35.9 C)    TempSrc:  Axillary    SpO2:  96%  (!) 89%  Weight:  69.8 kg     Height:        Constitutional: Lethargic Eyes: PERLA, EOMI, irises appear normal, anicteric sclera,  ENMT: external ears and nose appear normal,             Lips appears normal, oropharynx mucosa, tongue, posterior pharynx appear normal  Neck: neck appears normal, no masses, normal ROM, no thyromegaly, no JVD  CVS: S1-S2 clear, no murmur rubs or gallops, no LE edema, normal pedal pulses  Respiratory: Diminished breath sounds with possible faint crackles, unable to hear probably due to patient's inability to take deep breaths. Abdomen: soft moderately distended and mildly generalized tenderness, diminished bowel sounds, no hepatosplenomegaly, no hernias  Musculoskeletal: : no cyanosis, clubbing or edema noted bilaterally                      Skin: no rashes or lesions or ulcers, no induration or nodules    Data reviewed:  I have personally reviewed following labs and imaging studies Labs:  CBC: Recent Labs  Lab 02/15/20 1311 02/17/20 1606  WBC 7.4 8.6  HGB 12.4* 12.7*  HCT 40.6 40.4  MCV 88.6 87.3  PLT 229 973    Basic Metabolic Panel: Recent Labs  Lab 02/14/20 1127 02/14/20 1127 02/15/20 1310 02/17/20 1606  NA 136  --  133* 136  K 4.1   < > 4.9 3.7  CL 92*  --  91* 95*  CO2 26  --  23 25  GLUCOSE 127*  --  112* 102*  BUN 77*  --  112* 40*  CREATININE 5.34*  --  6.75* 3.05*  CALCIUM 8.7*  --  8.7* 8.6*  PHOS 3.6  --  4.9* 2.6   < > = values in this interval not displayed.   GFR Estimated Creatinine Clearance: 28.3 mL/min (A) (by C-G formula based on SCr of 3.05 mg/dL (H)). Liver Function Tests: Recent Labs  Lab 02/14/20 1127 02/15/20 1310 02/17/20 1606  ALBUMIN 3.5 3.6 3.9   No results for input(s): LIPASE, AMYLASE in the last 168 hours. No results for input(s): AMMONIA in the last 168 hours. Coagulation profile No results for input(s): INR, PROTIME in the last 168 hours.  Cardiac Enzymes: No results for input(s): CKTOTAL, CKMB, CKMBINDEX,  TROPONINI in the last 168 hours. BNP: Invalid input(s): POCBNP CBG: No results for input(s): GLUCAP in the last 168 hours. D-Dimer No results for input(s): DDIMER in the last 72 hours.  Hgb A1c No results for input(s): HGBA1C in the last 72 hours. Lipid Profile No results for input(s): CHOL, HDL, LDLCALC, TRIG, CHOLHDL, LDLDIRECT in the last 72 hours. Thyroid function studies No results for input(s): TSH, T4TOTAL, T3FREE, THYROIDAB in the last 72 hours.  Invalid input(s): FREET3 Anemia work up No results for input(s): VITAMINB12, FOLATE, FERRITIN, TIBC, IRON, RETICCTPCT in the last 72 hours. Urinalysis    Component Value Date/Time   COLORURINE YELLOW 04/19/2014 0109   APPEARANCEUR CLEAR 04/19/2014 0109   LABSPEC 1.012 04/19/2014 0109   PHURINE 7.0 04/19/2014 0109   GLUCOSEU NEGATIVE 04/19/2014 0109   HGBUR SMALL (A) 04/19/2014 0109   BILIRUBINUR NEGATIVE 04/19/2014 0109   KETONESUR NEGATIVE 04/19/2014 0109   PROTEINUR 100 (A) 04/19/2014 0109   UROBILINOGEN 0.2 04/19/2014 0109   NITRITE NEGATIVE 04/19/2014 0109   LEUKOCYTESUR TRACE (A) 04/19/2014 0109     Microbiology No results found for this or any previous visit (from the past 240 hour(s)).     Inpatient Medications:   Scheduled Meds: . amiodarone  200 mg Oral Daily  . apixaban  2.5 mg Oral BID  . ascorbic acid  500 mg Oral BID  . benztropine  1 mg Oral BID  . [START ON 02/21/2020] Chlorhexidine Gluconate Cloth  6 each Topical Q0600  . clonazePAM  0.5 mg Oral TID WC  . colchicine  0.6 mg Oral Daily  . dextromethorphan-guaiFENesin  1 tablet Oral BID  . docusate  100 mg Oral TID  . feeding supplement (PRO-STAT SUGAR FREE 64)  30 mL Oral BID  . folic acid  1 mg Oral Daily  . free water  100 mL Per Tube BID  . gabapentin  100 mg Oral Q8H  . levothyroxine  25 mcg Oral Q0600  . mouth rinse  15 mL Mouth Rinse BID  . Melatonin  3 mg Oral QHS  . metoprolol tartrate  25 mg Oral TID WC  . midodrine  10 mg Oral TID WC    . multivitamin  1 tablet Oral QHS  . predniSONE  10 mg Oral Q breakfast  . QUEtiapine  25 mg Oral QHS  . senna  2 tablet Oral QHS  . thiamine  100 mg Oral Daily   Continuous Infusions:   Radiological Exams on Admission: No results found.  Impression/Recommendations Principal Problem:   Encephalopathy Active Problems:   Debility   Lethargy   Sleep disturbance   Generalized anxiety disorder   Dysphagia   Somnolence  Acute encephalopathy: Source unknown but most likely either he is retaining CO2 as he is now on oxygen and has some crackles or due to acute hepatic encephalopathy.  Does not carry a history of liver cirrhosis but carries a long history of alcoholism.  Will check ammonia level as well as ABG, CBC and CMP.  He is also going to have chest x-ray ordered by primary team.  Would recommend holding all sedating medications until he is more clear.  These medications include tramadol, Seroquel, Klonopin.   Abdominal pain/distention: Patient is going to have CT abdomen and pelvis very soon.  I suspect some ascites.  G-tube assessed by IR this morning which is functioning fine per IR.  Will follow up on CT abdomen.  ESRD on HD: He is due for his HD today.  He was going this morning however due to lethargy this has been postponed for this afternoon.  I would strongly suggest that he gets his HD today as he seems to be  fluid overloaded.  Permanent atrial fibrillation: Rates controlled.  He is on amiodarone, metoprolol and Eliquis.  Continue current management.  Thank you for this consultation.  Our [facility] hospitalist team will follow the patient with you.   Time Spent: 50 minutes  Darliss Cheney M.D. [facility] 02/20/2020, 12:38 PM   If 7PM-7AM, please contact night-coverage [email or web address]

## 2020-02-20 NOTE — Progress Notes (Signed)
Patient on colace tid and Senna bid. He refused enema and mag citrate--has been having liquid stools but with increase discomfort and not eating minimal amount for past 24 hours. Is lethargic and hypoxic. Lungs with decreased sounds on right and rales to left mid back. Abdomen with increased distension and patient lethargic today. BP has been erratic. Per radiology not PEG related. KUB showed inspissated feeds which were d/c on 2/23. Was eating well till yesterday. He also have history of enlarged spleen--14 cm and intra-abdominal fluid per CT 1/20. Will repeat to rule out worsening/acute changes. Discussed with Dr. Valorie Roosevelt also get hospitalist for input.

## 2020-02-20 NOTE — Progress Notes (Signed)
Physical Therapy Session Note  Patient Details  Name: [name] [name] MRN: [id number] Date of Birth: [birth year]  Today's Date: 02/20/2020 PT Individual Time: 0800-0859 PT Individual Time Calculation (min): 59 min   Short Term Goals: Week 2:  PT Short Term Goal 1 (Week 2): Pt will negotiate 4 steps with B rails with min assist. PT Short Term Goal 1 - Progress (Week 2): Met PT Short Term Goal 2 (Week 2): Pt will complete car transfer with LRAD & min assist. PT Short Term Goal 2 - Progress (Week 2): Met PT Short Term Goal 3 (Week 2): Pt will ambulate 100 ft with LRAD & min assist. PT Short Term Goal 3 - Progress (Week 2): Met Week 3:  PT Short Term Goal 1 (Week 3): STG = LTG due to estimated d/c date.  Skilled Therapeutic Interventions/Progress Updates:   Received pt sitting in recliner, RN assisting pt back to bed, PT took over with care and assisted pt to return to bed. Pt agreeable to therapy and denied any pain during session. RN reported MD just in and pt reporting dizziness; MD suggested returning to bed. However, pt currently denied any dizziness and requesting to get back  into chair. Pt rolled to L and transferred sidelying<>sitting EOB with supervision. Therapist donned bilateral ace wraps with total assist. Pt transferred stand<>pivot with RW supervision to Loop. Therapist donned bilateral Darco shoes max A. O2 sat rest 88% and HR 49 bpm on 3L O2 via [place]. Pt denied SOB or pain, just reporting fatigue and constantly closing eyes throughout session; stated he did not sleep well last night. Attempted to transport pt out of room in [facility] for therapy, however pt refusing to leave room and also refused to ambulate. Therapist educated pt on importance of ambulating to obtain O2 sat level to justify home O2 use prior to discharge tomorrow. Therapist suggested practicing car transfer and stair navigation to prepare for discharge, however pt again refusing. Therapist notified RN, MD, and CSW on pt's refusal  to participate. After extensive convincing with PT and CSW, pt agreed to ambulate to obtain O2 sats only. Pt ambulated 54f with RW supervision on RA, O2 sat dropped to 77% and pt reported needing to sit and rest. O2 sat increasing to 83% with verbal cues for pursed lip breathing and therapist increased O2 to 3L and O2 sat again increasing to 95% within 1 minute seated rest break. [facility] while on 3L O2 and O2 sat at 99%. Concluded session with pt sitting in WC, needs within reach, and seatbelt alarm on. RN aware of pt's current status.   Patient saturations on room air at rest = 75% Patient saturations on room air while ambulating = 77% Patient saturations on 3 Liters of oxygen while ambulating = 99%  Briefly explain why patient needs home oxygen:  While sitting on RA, pt demonstrates increased SOB and decreased ability to participate in functional mobility. At rest on RA pt's O2 sat 75% and remaining at 75% despite cues for breathing techniques. While ambulating on RA, O2 sat decreased to 77% and pt reporting increased SOB requiring immediate seated rest break. With 3L O2, pt O2 sat 99% and pt with no c/o of SOB, fatigue, and able to safety ambulate to wheelchair.   Therapy Documentation Precautions:  Precautions Precautions: Fall Precaution Comments: Peg, Darco shoes Restrictions Weight Bearing Restrictions: No   Therapy/Group: Individual Therapy AnAlfonse AlpersT, DPT  02/20/2020, 10:02 AM

## 2020-02-20 NOTE — Progress Notes (Addendum)
[facility] Progress Note   Subjective:   Patient seen in room. Poor appetite today, reports ongoing SOB. Denies CP, palpitations, abdominal pain, N/V.  Objective Vitals:   02/19/20 2321 02/20/20 0500 02/20/20 0629 02/20/20 1010  BP:   (!) 125/113 122/88  Pulse: 68  65 (!) 57  Resp:   20   Temp:   (!) 96.6 F (35.9 C)   TempSrc:   Axillary   SpO2: 100%  96%   Weight:  69.8 kg    Height:       Physical Exam General: Chronically ill appearing male, alert and inNAD Heart: RRR, no murmurs, rubs or gallops Lungs: + rales left side to mid back, no wheezing/rhonchi/rales right Abdomen: Soft, round, +BS, +peg tube, non-tender to palpation Extremities: b/l lower extremities wrapped, no edema noted Dialysis Access: LUE AVF +bruit  Additional Objective Labs: Basic Metabolic Panel: Recent Labs  Lab 02/14/20 1127 02/15/20 1310 02/17/20 1606  NA 136 133* 136  K 4.1 4.9 3.7  CL 92* 91* 95*  CO2 26 23 25   GLUCOSE 127* 112* 102*  BUN 77* 112* 40*  CREATININE 5.34* 6.75* 3.05*  CALCIUM 8.7* 8.7* 8.6*  PHOS 3.6 4.9* 2.6   Liver Function Tests: Recent Labs  Lab 02/14/20 1127 02/15/20 1310 02/17/20 1606  ALBUMIN 3.5 3.6 3.9   No results for input(s): LIPASE, AMYLASE in the last 168 hours. CBC: Recent Labs  Lab 02/15/20 1311 02/17/20 1606  WBC 7.4 8.6  HGB 12.4* 12.7*  HCT 40.6 40.4  MCV 88.6 87.3  PLT 229 254   Blood Culture    Component Value Date/Time   SDES BLOOD RIGHT HAND 12/17/2019 1520   SPECREQUEST  12/17/2019 1520    BOTTLES DRAWN AEROBIC ONLY Blood Culture results may not be optimal due to an inadequate volume of blood received in culture bottles   CULT  12/17/2019 1520    NO GROWTH 5 DAYS Performed at [facility], [place] [address], [place], [place] [address]    REPTSTATUS 12/22/2019 FINAL 12/17/2019 1520    Medications:  . amiodarone  200 mg Oral Daily  . apixaban  2.5 mg Oral BID  . ascorbic acid  500 mg Oral BID  .  benztropine  1 mg Oral BID  . Chlorhexidine Gluconate Cloth  6 each Topical Q0600  . clonazePAM  0.5 mg Oral TID WC  . colchicine  0.6 mg Oral Daily  . dextromethorphan-guaiFENesin  1 tablet Oral BID  . docusate  100 mg Oral TID  . feeding supplement (PRO-STAT SUGAR FREE 64)  30 mL Oral BID  . folic acid  1 mg Oral Daily  . free water  100 mL Per Tube BID  . gabapentin  100 mg Oral Q8H  . levothyroxine  25 mcg Oral Q0600  . magnesium citrate  1 Bottle Oral Once  . mouth rinse  15 mL Mouth Rinse BID  . Melatonin  3 mg Oral QHS  . metoprolol tartrate  25 mg Oral TID WC  . midodrine  10 mg Oral TID WC  . multivitamin  1 tablet Oral QHS  . predniSONE  10 mg Oral Q breakfast  . QUEtiapine  25 mg Oral QHS  . senna  2 tablet Oral QHS  . thiamine  100 mg Oral Daily    Dialysis Orders: Prev orders - will need to change significantly on d/c MWF at [facility], 4:15hr, EDW 74.5kg, 2K/2.5Ca, AVF, heparin 5K - Calcitriol 0.63mcg PO q HD.  No ESA or venofer.  Assessment/Plan: 1.Deconditioning: S/p prolonged [facility] admit (12/2-12/23/19) s/p cardiac arrest, then [facility] admit until 2/21. Now CIR. Decreased appetite today, sepsis work up underway per primary including CXR and CT abdomen. Hold HD today until work up completed.  2. ESRD:On MWF schedulebut will be changing to TTS at discharge, volume up today, planned for extra HD today for volume before changing to TTS schedule.Tolerated HD in recliner on 3/5.K+ controlled. 3. HTN/volume:BPlow, currently on metoprololfor rate control+ midodrine.CXR 2/24and 2/28withpersistent pulm edemaand patient still on O2.Patient does appear volume overloaded today. Will attempt UFG 2.5-3L today pending work up as above, then resume TTS schedule. BP has been dropping with HD despite extra midodrine dose, limiting UF. 4. Anemia:lastHgb12.7.- no ESA needed. 5. Secondary hyperparathyroidism:Caat goal, phosvariable, 2.6 on 3/5.No binders/VDRA. 6. Hx cardiac  arrest 7. Hx PE: On Eliquis 8. A-fib: On Eliquis + amiodarone 9. Hx pericardial effusion:colchicine +prednisone 10mg  QD. 10. Anxiety + lethargy: Meds per primary. 11. HFrEF (<20%) 12. 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 On gabapentin 100mg  TID which is an appropriate renal dose, caution using higher doses. 14. Constipation: large stool burdern on KUB 3/5. Refused enema and was given mag citrate. No abdominal pain at present. Recommend avoiding magnesium containing products as able in ESRD patients.   Anice Paganini, PA-C 02/20/2020, 11:34 AM  [facility] Pager: [phone]  Pt seen, examined and agree w A/P as above. Pt looking worse this am and ^d lethargy, bradycardia and K+ returned > 7.0.  Taken to dialysis and hopefully w/ HD some of this issues will resolve. CXR showing worsening bibasilar airspace disease, does not have typical appearance of pulm edema, consider CT chest to r/o PNA /consolidation after HD if stable enough. Pt chronically ill and at risk of complications w/ today's HD and I have d/w pt's father and his daughter.  Kelly Splinter  MD 02/20/2020, 4:07 PM

## 2020-02-20 NOTE — Progress Notes (Signed)
RT NOTE: RT attempted to obtain ABG X2  but was unsuccessful. RN notified. Patient is now leaving with transport to go to dialysis. RT will continue to monitor.

## 2020-02-20 NOTE — Progress Notes (Signed)
Social Work Patient ID: [name] [name], male   DOB: 10-27-1966, 54 y.o.   MRN: [id number]   SW waiting on follow-up with regard to o2 recommendations from medical team.  SW met with pt in room while in therapy to follow up about HHA. Pt could not recall what SW was referring too. SW informed pt will follow-up with his mother.   SW received phone call from pt mother [name] [phone] inquired about pt d/c tomorrow and concerns related to transportation since she has dialysis tomorrow. SW informed that time of pick up was not an issue, and to let us know the plan. Reports no HHA preference. SW will follow-up once an agency is selected.   SW waiting on updates from [name] about referral for HHPT/OT/ST.  *Pt referral accepted for [facility] [phone]. Delay in [facility] for speech therapy.   Medical team reports possible delay in d/c tomorrow for pt. SW met with pt and pt father in room to discuss d/c plan. SW informed on above conversation with his mother. Pt father states if pt is not leaving tomorrow he would like for someone to call him and give him an update since he is primary transportation and has an appt tomorrow at [place] indicated there will be follow-up. SW confirms DME in pt room.   SW updated [employer] [phone] on above possible delay in d/c. Will follow up if pt requires dialysis tomorrow prior to d/c. SW updated [name] to inform on possible delay in d/c tomorrow due to medical concerns. SW requested SN and CNA as well for pt. SW waiting on follow-up about addtl services.   Will need to update pt d/c instructions if addtl services are added.   Loralee Pacas, MSW, [place] Office: [phone] Cell: [phone] Fax: [phone]

## 2020-02-20 NOTE — Progress Notes (Addendum)
This nurse spoke to [facility] regarding critical potassium of 7.3. Per Pam order stat EKG and call dialysis to have patient transported. Per Melissa in hemodialysis, it will be approximately 30 minutes before there will be an opening for the patient. Will continue to monitor.attempyed to give patient juice and peanut butter and graham crackers or applesauce. Patient tppk a few sips of juice , refused peanut butter , graham crackers and applesauce. Per Dr. Letta Pate give glucose gel.

## 2020-02-20 NOTE — Progress Notes (Signed)
Pharmacy Antibiotic Note  [name] [name] is a 54 y.o. male admitted on 02/03/2020 with pneumonia.  Pharmacy has been consulted for zosyn dosing in HD patient.  Chest Xray today with possible PNA, bilateral pleural effusion.  WBC rising.  Plan: Zosyn 2.25g IV q 8  Hrs. F/u cultures, clinical course.  Height: 5' 10.5" (179.1 cm) Weight: 150 lb 5.7 oz (68.2 kg) IBW/kg (Calculated) : 74.15  Temp (24hrs), Avg:96.3 F (35.7 C), Min:96 F (35.6 C), Max:96.6 F (35.9 C)  Recent Labs  Lab 02/14/20 1127 02/15/20 1310 02/15/20 1311 02/17/20 1606 02/20/20 1201  WBC  --   --  7.4 8.6 15.8*  CREATININE 5.34* 6.75*  --  3.05* 7.95*    Estimated Creatinine Clearance: 10.6 mL/min (A) (by C-G formula based on SCr of 7.95 mg/dL (H)).    Allergies  Allergen Reactions  . Losartan Cough    Thank you for allowing pharmacy to be a part of this patient's care.  Marguerite Olea, [facility] Clinical Pharmacist Phone [phone]  02/20/2020 5:07 PM

## 2020-02-20 NOTE — Progress Notes (Signed)
Occupational Therapy Session Note  Patient Details  Name: [name] [name] MRN: [id number] Date of Birth: Dec 12, 1966  Today's Date: 02/20/2020 OT Individual Time:  - 60 minutes missed      Short Term Goals: Week 3:  OT Short Term Goal 1 (Week 3): LTG=STG 2/2 ELOS  Skilled Therapeutic Interventions/Progress Updates:    Pt seen for scheduled OT with PA present in the room. She requested to hold therapy due to medical reasons. 60 minutes missed.   Therapy Documentation Precautions:  Precautions Precautions: Fall Precaution Comments: Peg, Darco shoes Restrictions Weight Bearing Restrictions: No Vital Signs: Therapy Vitals Temp: (!) 96.6 F (35.9 C) Temp Source: Axillary Pulse Rate: 65(manually rechecked) Resp: 20 BP: (!) 125/113 Patient Position (if appropriate): Sitting Oxygen Therapy SpO2: 96 % O2 Device: Nasal Cannula Pain: Pain Assessment Pain Scale: 0-10 Pain Score: 4  Pain Type: Acute pain Pain Location: Foot Pain Orientation: Right;Left Pain Descriptors / Indicators: Aching;Constant Pain Frequency: Constant Pain Onset: On-going Patients Stated Pain Goal: 0 Pain Intervention(s): Medication (See eMAR);Repositioned;Elevated extremity ADL:       Therapy/Group: Individual Therapy  Cherae Marton A Landri Dorsainvil 02/20/2020, 7:24 AM

## 2020-02-20 NOTE — Progress Notes (Signed)
Patient seen at bedside per Team's request for serosanguinous drainage around gastrostomy tube exit site. 54 y.o, male inpatient. History of ESRD, polysubstance abuse admitted after PEA arrest at dialysis center.Patient is on elqius for PE and a fib. IR placed a 20 Fr pull through gastrostomy tube on 1.25.21. Xray from 3.5.21 Shows residual feeds in stomach. Patient abdomen is visably distending with seosanginous drainage around exit site. Flange appropriately positioned. G tube is able to be flushed and aspirated without difficulty. Patient denies any abdominal pain, nausea or vomiting.  Patient to stable from IR perscpective s/p  further plans per primary Team please call IR with questions or concerns. P. Love PA aware.

## 2020-02-20 NOTE — Consult Note (Signed)
Medical Consultation (this is a copy of my earlier note which was selected as progress note by error)   [name] [name]  JHE:[id number]  DOB: Jul 06, 1966  DOA: 02/03/2020  PCP: Jean Rosenthal, MD   Requesting physician: Olin Hauser love  Reason for consultation: Acute encephalopathy/abdominal distention   History of Present Illness: [name] [name] is an 54 y.o. 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  He required long-term ventilation with weaning from vent.  Hospital service was consulted for new onset of altered mental status and abdominal distention.  He was discharged by P[facility] on 12/[facility]/2019 [facility] and currently has been in rehabilitation for several weeks.  Hospital service was called regarding patient's altered mental status that has been going on since this morning and abdominal distention that has been going on since about 3 days.  Patient seen and examined in his room.  His primary RN was present at the bedside.  Patient was too lethargic to hold conversation however he was able to answer a few questions.  He denied any complaint but then endorsed having some shortness of breath and abdominal pain upon asking.  Per RN, patient did not have a bowel movement on Friday so several medications were ordered for him which he had refused which included Colace, senna and also refused enema and mag citrate.  Apparently, he has been having liquidy stools but with increased discomfort and has not been eating well.  I was also told that currently he is on 2 L of nasal correction which  has been placed since Friday but looking back in the chart, he seems to be requiring oxygen on and off.  He also has a PEG tube which was draining serosanguineous fluid and then blood for which he was evaluated by IR today.  G-tube is flushing and this was aspirated without difficulty.  Patient is a stable from IR perspective.  Abdominal x-ray was done on 02/17/2020 which showed large amount of mottled material distending the stomach which may reflect large amount of insufflated post feed residuals.   Review of Systems:  ROS As per HPI otherwise negative  Past Medical History: Past Medical History:  Diagnosis Date  . Abdominal distension 08/10/2018  . Acute on chronic respiratory failure with hypoxia ([place])   . Acute on chronic systolic and diastolic heart failure, NYHA class 4 ([place])   . Acute pulmonary embolism without acute cor pulmonale (HCC)   . Alcohol abuse 11/26/2010   Qualifier: Diagnosis of  By: [name] MD, Pershing Proud  . Anemia due to blood loss, acute 07/15/2012  . CHF (congestive heart failure) ([place])   . Cholecystitis 04/18/2014  . Chronic atrial fibrillation ([place])   . Cigarette smoker    has currently quit  . Cocaine abuse ([place]) 11/26/2010   Qualifier: Diagnosis of  By: [name] MD, Benjamine Mola  has been over a year  . Combined congestive systolic and diastolic heart failure ([place]) 04/18/2014   A. Echo 8/13: Severe LVH, EF 40-45%, inferoposterior akinesis, grade 2 diastolic dysfunction, moderate LAE, mild RVE, mildly reduced RVSF, mild RAE; cannot rule out R atrial mass-suggest TEE or cardiac MRI  //  B. Echo 3/17: Mild  LVH, EF 25-30%, diffuse HK, grade 2 diastolic dysfunction, mild MR, severe LAE,  moderately reduced RVSF, severe RAE, mild TR, moderate PI, PASP 55 mmHg    . Dyspnea   . End stage renal disease on dialysis ([place])   . ESRD (end stage renal disease) on dialysis [facility])    MWF- [place] (05/18/2017)  . Hemodialysis patient ([place])    M,W,F  . Hernia, inguinal,  right 08/05/2016  . Hypertension   . 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 Hypocalcemia 07/15/2012  . Medical non-compliance   . Non-ischemic cardiomyopathy ([place])    A. R/L HC 3/17: Normal coronary arteries, moderate pulmonary hypertension (PASP 65 mmHg), elevated LV filling pressures (LVEDP 45 mmHg)   . NSVT (nonsustained ventricular tachycardia) ([place]) 02/22/2016  . NSVT (nonsustained ventricular tachycardia) ([place]) 02/22/2016  . Pneumonia 11/12/2017  . Polysubstance abuse ([place])   . Prolonged Q-T interval on ECG 05/18/2017  . Prolonged QT interval 05/18/2017  . Restless leg syndrome   . Solitary pulmonary nodule 10/10/2015   See cxr 10/09/2015 - CT rec 10/10/2015 >>>   . Upper airway cough syndrome 10/09/2015   Off ACEi around 1st Oct 2016  - Sinus CT 10/10/2015 >>>      Past Surgical History: Past Surgical History:  Procedure Laterality Date  . [place] TRANSPOSITION  01/19/2013   Procedure: BASCILIC VEIN TRANSPOSITION;  Surgeon: Conrad Lafferty, MD;  Location: [facility];  Service: Vascular;  Laterality: Left;  left 2nd stage basilic vein transposition  . CARDIAC CATHETERIZATION N/A 02/25/2016   Procedure: Right/Left Heart Cath and Coronary Angiography;  Surgeon: Burnell Blanks, MD;  Location: [facility] CV LAB;  Service: Cardiovascular;  Laterality: N/A;  . INGUINAL HERNIA REPAIR Right 08/05/2016   Procedure: RIGHT INGUINAL HERNIA REPAIR WITH MESH;  Surgeon: Donnie Mesa, MD;  Location: [place];  Service: General;  Laterality: Right;  . INSERTION OF DIALYSIS CATHETER  07/17/2012   Procedure: INSERTION OF DIALYSIS CATHETER;  Surgeon: Conrad Ridgecrest, MD;  Location: [facility];  Service: Vascular;  Laterality: Right;  right internal jugular  . INSERTION OF MESH Right 08/05/2016   Procedure: INSERTION OF MESH;  Surgeon: Donnie Mesa, MD;  Location: [facility];  Service: General;   Laterality: Right;  . IR GASTROSTOMY TUBE MOD SED  01/09/2020  . IR THORACENTESIS ASP PLEURAL SPACE W/IMG GUIDE  01/12/2020  . REVISION OF ARTERIOVENOUS GORETEX GRAFT Left 7/51/0258   Procedure: PLICATION OF ARTERIOVENOUS FISTULA LEFT ARM;  Surgeon: Elam Dutch, MD;  Location: [facility];  Service: Vascular;  Laterality: Left;  . REVISON OF ARTERIOVENOUS FISTULA Left 01/06/2017   Procedure: REVISON OF ARTERIOVENOUS FISTULA;  Surgeon: Conrad Lebam, MD;  Location: [facility];  Service: Vascular;  Laterality: Left;  . REVISON OF ARTERIOVENOUS FISTULA Left 10/27/2018   Procedure: REVISION PLICATION OF ARTERIOVENOUS FISTULA  LEFT UPPER ARM;  Surgeon: Waynetta Sandy, MD;  Location: [place] [facility];  Service: Vascular;  Laterality: Left;  . TRACHEOSTOMY TUBE PLACEMENT N/A 11/24/2019   Procedure: TRACHEOSTOMY;  Surgeon: Izora Gala, MD;  Location: [facility];  Service: ENT;  Laterality: N/A;  . UMBILICAL HERNIA REPAIR N/A 08/05/2016   Procedure: [place];  Surgeon: Donnie Mesa, MD;  Location: [place];  Service: General;  Laterality: N/A;  . VENOGRAM N/A 08/09/2012   Procedure: VENOGRAM;  Surgeon: Conrad Mead, MD;  Location: [place] CATH LAB;  Service: Cardiovascular;  Laterality: N/A;     Allergies:   Allergies  Allergen Reactions  . Losartan Cough     Social History:  reports that he quit smoking about 16 months ago. His smoking use included cigarettes. He quit after 30.00 years of use. He has never used smokeless tobacco. He reports current alcohol use of about 4.0 standard drinks of alcohol per week. He reports that he does not use drugs.   Family History: Family History  Problem Relation Age of Onset  . Hypertension Mother   . Diabetes Mother   . Renal Disease Mother   . Hypertension Father      Physical Exam: Vitals:   02/20/20 1522 02/20/20 1530 02/20/20 1600 02/20/20 1605  BP: (!) 102/56 (!) 106/45 (!) 111/54 (!) 104/40  Pulse: 60 (!) 49 69 82  Resp:   15 16   Temp:      TempSrc:      SpO2:      Weight:      Height:        Constitutional: Lethargic Eyes: PERLA, EOMI, irises appear normal, anicteric sclera,  ENMT: external ears and nose appear normal,             Lips appears normal, oropharynx mucosa, tongue, posterior pharynx appear normal  Neck: neck appears normal, no masses, normal ROM, no thyromegaly, no JVD  CVS: S1-S2 clear, no murmur rubs or gallops, no LE edema, normal pedal pulses  Respiratory: Diminished breath sounds with possible faint crackles, unable to hear probably due to patient's inability to take deep breaths. Abdomen: soft moderately distended and mildly generalized tenderness, diminished bowel sounds, no hepatosplenomegaly, no hernias  Musculoskeletal: : no cyanosis, clubbing or edema noted bilaterally                      Skin: no rashes or lesions or ulcers, no induration or nodules    Data reviewed:  I have personally reviewed following labs and imaging studies Labs:  CBC: Recent Labs  Lab 02/15/20 1311 02/17/20 1606 02/20/20 1201  WBC 7.4 8.6 15.8*  NEUTROABS  --   --  12.7*  HGB 12.4* 12.7* 13.9  HCT 40.6 40.4 46.1  MCV 88.6 87.3 91.7  PLT 229 254 096    Basic Metabolic Panel: Recent Labs  Lab 02/14/20 1127 02/14/20 1127 02/15/20 1310 02/15/20 1310 02/17/20 1606 02/20/20 1201  NA 136  --  133*  --  136 133*  K 4.1   < > 4.9   < > 3.7 7.3*  CL 92*  --  91*  --  95* 90*  CO2 26  --  23  --  25 16*  GLUCOSE 127*  --  112*  --  102* 67*  BUN 77*  --  112*  --  40* 118*  CREATININE 5.34*  --  6.75*  --  3.05* 7.95*  CALCIUM 8.7*  --  8.7*  --  8.6* 8.7*  PHOS 3.6  --  4.9*  --  2.6  --    < > = values in this interval not displayed.   GFR Estimated Creatinine Clearance: 10.6 mL/min (A) (by C-G formula based on SCr of 7.95 mg/dL (H)). Liver Function Tests: Recent Labs  Lab 02/14/20 1127 02/15/20 1310 02/17/20 1606 02/20/20 1201  AST  --   --   --  61*  ALT  --   --   --  63*  ALKPHOS   --   --   --  148*  BILITOT  --   --   --  1.4*  PROT  --   --   --  7.3  ALBUMIN 3.5 3.6 3.9 3.7   No results for input(s): LIPASE, AMYLASE in the last 168 hours. Recent Labs  Lab 02/20/20 1431  AMMONIA 27   Coagulation profile No results for input(s): INR, PROTIME in the last 168 hours.  Cardiac Enzymes: No results for input(s): CKTOTAL, CKMB, CKMBINDEX, TROPONINI in the last 168 hours. BNP: Invalid input(s): POCBNP CBG: Recent Labs  Lab 02/20/20 1601  GLUCAP 99   D-Dimer No results for input(s): DDIMER in the last 72 hours. Hgb A1c No results for input(s): HGBA1C in the last 72 hours. Lipid Profile No results for input(s): CHOL, HDL, LDLCALC, TRIG, CHOLHDL, LDLDIRECT in the last 72 hours. Thyroid function studies No results for input(s): TSH, T4TOTAL, T3FREE, THYROIDAB in the last 72 hours.  Invalid input(s): FREET3 Anemia work up No results for input(s): VITAMINB12, FOLATE, FERRITIN, TIBC, IRON, RETICCTPCT in the last 72 hours. Urinalysis    Component Value Date/Time   COLORURINE YELLOW 04/19/2014 0109   APPEARANCEUR CLEAR 04/19/2014 0109   LABSPEC 1.012 04/19/2014 0109   PHURINE 7.0 04/19/2014 0109   GLUCOSEU NEGATIVE 04/19/2014 0109   HGBUR SMALL (A) 04/19/2014 0109   BILIRUBINUR NEGATIVE 04/19/2014 0109   KETONESUR NEGATIVE 04/19/2014 0109   PROTEINUR 100 (A) 04/19/2014 0109   UROBILINOGEN 0.2 04/19/2014 0109   NITRITE NEGATIVE 04/19/2014 0109   LEUKOCYTESUR TRACE (A) 04/19/2014 0109     Microbiology No results found for this or any previous visit (from the past 240 hour(s)).     Inpatient Medications:   Scheduled Meds: . amiodarone  200 mg Oral Daily  . apixaban  2.5 mg Oral BID  . ascorbic acid  500 mg Oral BID  . benztropine  1 mg Oral BID  . [START ON 02/21/2020] Chlorhexidine Gluconate Cloth  6 each Topical Q0600  . clonazePAM  0.5 mg Oral TID WC  . colchicine  0.6 mg Oral Daily  . dextromethorphan-guaiFENesin  1 tablet Oral BID  .  docusate  100 mg Oral TID  . feeding supplement (PRO-STAT SUGAR FREE 64)  30 mL Oral BID  . folic acid  1 mg Oral Daily  . free water  100 mL Per Tube BID  . gabapentin  100 mg Oral Q8H  . levothyroxine  25 mcg Oral Q0600  . mouth rinse  15 mL Mouth Rinse BID  . Melatonin  3 mg Oral QHS  . metoprolol tartrate  25 mg Oral TID WC  . midodrine  10 mg Oral TID WC  . multivitamin  1 tablet Oral QHS  . predniSONE  10 mg Oral Q breakfast  . QUEtiapine  25 mg Oral QHS  . senna  2 tablet Oral QHS  . thiamine  100 mg Oral Daily   Continuous Infusions:   Radiological Exams on Admission: DG CHEST PORT 1 VIEW  Result Date: 02/20/2020 CLINICAL DATA:  Lethargy. Decreased blood pressure. Shortness of breath. EXAM: PORTABLE CHEST 1 VIEW COMPARISON:  02/12/2020 FINDINGS: Midline trachea. Cardiomegaly accentuated by AP portable technique. Small right pleural effusion. No pneumothorax. Moderate interstitial edema. Probable right hemidiaphragm elevation with adjacent right base airspace disease, similar. Mild left lower lobe airspace disease is not significantly changed. IMPRESSION: Moderate, increased congestive heart failure. Small right pleural effusion with right greater than left base airspace disease, similar.  Most likely atelectasis. Pneumonia at the right lung base cannot be excluded. Electronically Signed   By: Abigail Miyamoto M.D.   On: 02/20/2020 16:26    Impression/Recommendations Principal Problem:   Encephalopathy Active Problems:   Debility   Lethargy   Sleep disturbance   Generalized anxiety disorder   Dysphagia   Somnolence  Acute encephalopathy: Source unknown but most likely either he is retaining CO2 as he is now on oxygen and has some crackles or due to acute hepatic encephalopathy.  Does not carry a history of liver cirrhosis but carries a long history of alcoholism.  Will check ammonia level as well as ABG, CBC and CMP.  He is also going to have chest x-ray ordered by primary team.   Would recommend holding all sedating medications until he is more clear.  These medications include tramadol, Seroquel, Klonopin.   Abdominal pain/distention: Patient is going to have CT abdomen and pelvis very soon.  I suspect some ascites.  G-tube assessed by IR this morning which is functioning fine per IR.  Will follow up on CT abdomen.  ESRD on HD: He is due for his HD today.  He was going this morning however due to lethargy this has been postponed for this afternoon.  I would strongly suggest that he gets his HD today as he seems to be fluid overloaded.  Permanent atrial fibrillation: Rates controlled.  He is on amiodarone, metoprolol and Eliquis.  Continue current management.  Thank you for this consultation.  Our [facility] hospitalist team will follow the patient with you.   Time Spent: 50 minutes  Darliss Cheney M.D. [facility] 02/20/2020, 4:37 PM   If 7PM-7AM, please contact night-coverage [email or web address]   Addendum at 4:40 PM: Patient chest x-ray is completed which shows pulmonary edema, congestive heart failure and possibly pneumonia, mostly in the right side with bilateral pleural effusion.  He has leukocytosis of 15.  This indicates possible pneumonia.  We will start him on Zosyn.  Also received a text message from nephrologist Dr. Jonnie Finner who recommends doing CT chest to have a better look at his chest.  CT abdomen is still pending.

## 2020-02-20 NOTE — Progress Notes (Signed)
This nurse spoke to [place] in dialysis. Patient very lethargic, Did not eat breakfast, or lunch. Patient did drink Nepro. Per Minna Merritts they will not get patient at this time, but will call when they are ready.

## 2020-02-21 ENCOUNTER — Inpatient Hospital Stay (HOSPITAL_COMMUNITY): Payer: Medicare Other

## 2020-02-21 ENCOUNTER — Inpatient Hospital Stay (HOSPITAL_COMMUNITY): Payer: Medicare Other | Admitting: Occupational Therapy

## 2020-02-21 ENCOUNTER — Inpatient Hospital Stay (HOSPITAL_COMMUNITY): Payer: Medicare Other | Admitting: Speech Pathology

## 2020-02-21 ENCOUNTER — Inpatient Hospital Stay (HOSPITAL_COMMUNITY): Payer: Medicare Other | Admitting: Physical Therapy

## 2020-02-21 HISTORY — PX: IR THORACENTESIS ASP PLEURAL SPACE W/IMG GUIDE: IMG5380

## 2020-02-21 LAB — CBC WITH DIFFERENTIAL/PLATELET
Abs Immature Granulocytes: 0.3 10*3/uL — ABNORMAL HIGH (ref 0.00–0.07)
Basophils Absolute: 0.1 10*3/uL (ref 0.0–0.1)
Basophils Relative: 0 %
Eosinophils Absolute: 0 10*3/uL (ref 0.0–0.5)
Eosinophils Relative: 0 %
HCT: 40.8 % (ref 39.0–52.0)
Hemoglobin: 12.7 g/dL — ABNORMAL LOW (ref 13.0–17.0)
Immature Granulocytes: 2 %
Lymphocytes Relative: 3 %
Lymphs Abs: 0.5 10*3/uL — ABNORMAL LOW (ref 0.7–4.0)
MCH: 27.5 pg (ref 26.0–34.0)
MCHC: 31.1 g/dL (ref 30.0–36.0)
MCV: 88.5 fL (ref 80.0–100.0)
Monocytes Absolute: 1.2 10*3/uL — ABNORMAL HIGH (ref 0.1–1.0)
Monocytes Relative: 8 %
Neutro Abs: 13.2 10*3/uL — ABNORMAL HIGH (ref 1.7–7.7)
Neutrophils Relative %: 87 %
Platelets: 219 10*3/uL (ref 150–400)
RBC: 4.61 MIL/uL (ref 4.22–5.81)
RDW: 22.4 % — ABNORMAL HIGH (ref 11.5–15.5)
WBC: 15.2 10*3/uL — ABNORMAL HIGH (ref 4.0–10.5)
nRBC: 1.7 % — ABNORMAL HIGH (ref 0.0–0.2)

## 2020-02-21 LAB — CBC
HCT: 40.8 % (ref 39.0–52.0)
Hemoglobin: 12.7 g/dL — ABNORMAL LOW (ref 13.0–17.0)
MCH: 27.6 pg (ref 26.0–34.0)
MCHC: 31.1 g/dL (ref 30.0–36.0)
MCV: 88.7 fL (ref 80.0–100.0)
Platelets: 218 10*3/uL (ref 150–400)
RBC: 4.6 MIL/uL (ref 4.22–5.81)
RDW: 22.6 % — ABNORMAL HIGH (ref 11.5–15.5)
WBC: 14.2 10*3/uL — ABNORMAL HIGH (ref 4.0–10.5)
nRBC: 2.1 % — ABNORMAL HIGH (ref 0.0–0.2)

## 2020-02-21 LAB — RENAL FUNCTION PANEL
Albumin: 3.6 g/dL (ref 3.5–5.0)
Anion gap: 24 — ABNORMAL HIGH (ref 5–15)
BUN: 64 mg/dL — ABNORMAL HIGH (ref 6–20)
CO2: 18 mmol/L — ABNORMAL LOW (ref 22–32)
Calcium: 8.6 mg/dL — ABNORMAL LOW (ref 8.9–10.3)
Chloride: 91 mmol/L — ABNORMAL LOW (ref 98–111)
Creatinine, Ser: 5.36 mg/dL — ABNORMAL HIGH (ref 0.61–1.24)
GFR calc Af Amer: 13 mL/min — ABNORMAL LOW (ref >60)
GFR calc non Af Amer: 11 mL/min — ABNORMAL LOW (ref >60)
Glucose, Bld: 100 mg/dL — ABNORMAL HIGH (ref 70–99)
Phosphorus: 6.3 mg/dL — ABNORMAL HIGH (ref 2.5–4.6)
Potassium: 5.1 mmol/L (ref 3.5–5.1)
Sodium: 133 mmol/L — ABNORMAL LOW (ref 135–145)

## 2020-02-21 LAB — BASIC METABOLIC PANEL
Anion gap: 22 — ABNORMAL HIGH (ref 5–15)
BUN: 57 mg/dL — ABNORMAL HIGH (ref 6–20)
CO2: 21 mmol/L — ABNORMAL LOW (ref 22–32)
Calcium: 8.8 mg/dL — ABNORMAL LOW (ref 8.9–10.3)
Chloride: 91 mmol/L — ABNORMAL LOW (ref 98–111)
Creatinine, Ser: 5 mg/dL — ABNORMAL HIGH (ref 0.61–1.24)
GFR calc Af Amer: 14 mL/min — ABNORMAL LOW (ref >60)
GFR calc non Af Amer: 12 mL/min — ABNORMAL LOW (ref >60)
Glucose, Bld: 83 mg/dL (ref 70–99)
Potassium: 4.8 mmol/L (ref 3.5–5.1)
Sodium: 134 mmol/L — ABNORMAL LOW (ref 135–145)

## 2020-02-21 MED ORDER — LIDOCAINE HCL (PF) 1 % IJ SOLN
INTRAMUSCULAR | Status: DC | PRN
  Administered 2020-02-21: 10 mL

## 2020-02-21 MED ORDER — LIDOCAINE HCL 1 % IJ SOLN
INTRAMUSCULAR | Status: AC
  Filled 2020-02-21: qty 20

## 2020-02-21 MED ORDER — TRAMADOL HCL 50 MG PO TABS
ORAL_TABLET | ORAL | Status: AC
  Administered 2020-02-21: 50 mg via ORAL
  Filled 2020-02-21: qty 1

## 2020-02-21 MED ORDER — CHLORHEXIDINE GLUCONATE CLOTH 2 % EX PADS
6.0000 | MEDICATED_PAD | Freq: Every day | CUTANEOUS | Status: DC
  Administered 2020-02-22: 6 via TOPICAL

## 2020-02-21 NOTE — Progress Notes (Signed)
Physical Therapy Session Note  Patient Details  Name: [name] [name] MRN: [id number] Date of Birth: [birth year]  Today's Date: 02/21/2020  Patient in recliner in room upon PT arrival. Patient very lethargic, requiring several cues to converse with PT and open his eyes. Patient reported 10/10 B foot pain, RN aware and provided pain medicine with PT in room. Offered for patient to perform seated exercises to avoid weight bearing on LEs and/or to assist patient to the bed to elevate LEs. Patient refused all activity and did not participate in exercises when initiated by therapist. Educated on importance of participating with therapy in preparation for d/c, patient continued to refuse despite encouragement. Patient missed 30 min of skilled PT due to B foot pain and lethargy, RN made aware and reports that this behavior has been consistent during patient's stay. Will attempt to make up missed time as able.  [name] [name] PT, DPT  02/21/2020, 11:21 AM

## 2020-02-21 NOTE — Progress Notes (Signed)
Patient seen and examined, database reviewed.  Patient was sitting in chair, short of breath.  Rehab had requested medical consultation yesterday due to shortness of breath.  He is end-stage renal disease and is on hemodialysis.   He is receiving an extra HD session today.  CT scan on 3/8 showed large right and small left pleural effusions with edema.  I placed consultation with IR for right-sided thoracentesis, this was accomplished later this afternoon with removal of 450 cc.  Patient seen again at bedside, his shortness of breath has significantly improved.  He remains deconditioned.  I will sign off at this time., please call us back if questions.  (Exam reflects am findings) General exam: Alert, awake, oriented x 3, sitting in chair at bedside. Respiratory system: Decreased BS bilateral bases and up to mid lung fields on right. Increased respiratory effort. Cardiovascular system:RRR. No murmurs, rubs, gallops. Gastrointestinal system: Abdomen is nondistended, soft and nontender. No organomegaly or masses felt. Normal bowel sounds heard. Central nervous system: Alert and oriented. No focal neurological deficits, generally weak and deconditioned. Extremities: Bilateral lower extremities are wrapped in clean dressing. Psychiatry:  Mood & affect appropriate.     Domingo Mend, MD [facility] [phone]

## 2020-02-21 NOTE — Progress Notes (Addendum)
Per [place] in dialysis. They will pick patient up within an hour This nurse spoke to Patient regarding cleaning feet and applying dressing . Patient allowed this nurse to clean right foot, apply some gauze between toes and then refused the rest of the dressing change.  Dressing changed to G-tube site at 0850 and 1350. Serosanguineous drainage.

## 2020-02-21 NOTE — Progress Notes (Addendum)
[facility] Progress Note   Subjective:   Patient seen in room. Reports feeling better after dialysis but still appears fatigued. Denies SOB, CP, dizziness, abdominal pain, N/V/D. Ongoing b/l foot pain.   Objective Vitals:   02/20/20 1850 02/20/20 2030 02/21/20 0524 02/21/20 0844  BP: 109/75 106/80 109/88 (!) 117/95  Pulse: 72 76 72 73  Resp: (!) 24 20 20    Temp: 98.9 F (37.2 C) 98.3 F (36.8 C) 98.6 F (37 C)   TempSrc: Oral Oral    SpO2: 97% 96% 94%   Weight: 65.6 kg  67.2 kg   Height:       Physical Exam General: Well developed, chronically ill appearing male in NAD Heart: RRR, no murmurs, rubs or gallops Lungs: + rales left lower lobe, decreased breath sounds right Abdomen:Soft, round, +BS, +peg tube, non-tender to palpation Extremities: no edema noted bilateral lower extremities Dialysis Access: LUE AVF + bruit  Additional Objective Labs: Basic Metabolic Panel: Recent Labs  Lab 02/15/20 1310 02/15/20 1310 02/17/20 1606 02/20/20 1201 02/21/20 0532  NA 133*   < > 136 133* 134*  K 4.9   < > 3.7 7.3* 4.8  CL 91*   < > 95* 90* 91*  CO2 23   < > 25 16* 21*  GLUCOSE 112*   < > 102* 67* 83  BUN 112*   < > 40* 118* 57*  CREATININE 6.75*   < > 3.05* 7.95* 5.00*  CALCIUM 8.7*   < > 8.6* 8.7* 8.8*  PHOS 4.9*  --  2.6  --   --    < > = values in this interval not displayed.   Liver Function Tests: Recent Labs  Lab 02/15/20 1310 02/17/20 1606 02/20/20 1201  AST  --   --  61*  ALT  --   --  63*  ALKPHOS  --   --  148*  BILITOT  --   --  1.4*  PROT  --   --  7.3  ALBUMIN 3.6 3.9 3.7   CBC: Recent Labs  Lab 02/15/20 1311 02/15/20 1311 02/17/20 1606 02/20/20 1201 02/21/20 0532  WBC 7.4   < > 8.6 15.8* 15.2*  NEUTROABS  --   --   --  12.7* 13.2*  HGB 12.4*   < > 12.7* 13.9 12.7*  HCT 40.6   < > 40.4 46.1 40.8  MCV 88.6  --  87.3 91.7 88.5  PLT 229   < > 254 293 219   < > = values in this interval not displayed.   Blood Culture     Component Value Date/Time   SDES BLOOD RIGHT HAND 12/17/2019 1520   SPECREQUEST  12/17/2019 1520    BOTTLES DRAWN AEROBIC ONLY Blood Culture results may not be optimal due to an inadequate volume of blood received in culture bottles   CULT  12/17/2019 1520    NO GROWTH 5 DAYS Performed at [facility], [place] [address], [place], [place] [address]    REPTSTATUS 12/22/2019 FINAL 12/17/2019 1520   CBG: Recent Labs  Lab 02/20/20 1601 02/20/20 1830  GLUCAP 99 84    Studies/Results: CT CHEST W CONTRAST  Result Date: 02/20/2020 CLINICAL DATA:  Hypoxemia. Abdominal pain. End-stage renal disease. EXAM: CT CHEST, ABDOMEN, AND PELVIS WITH CONTRAST TECHNIQUE: Multidetector CT imaging of the chest, abdomen and pelvis was performed following the standard protocol during bolus administration of intravenous contrast. CONTRAST:  134mL OMNIPAQUE IOHEXOL 300 MG/ML  SOLN COMPARISON:  Multiple exams, including CT abdomen from 01/04/2020 and CT chest from 11/16/2019 FINDINGS: CT CHEST FINDINGS Cardiovascular: Moderate to prominent cardiomegaly. Atherosclerotic calcification of the aortic arch. Enlarged main pulmonary artery indicating pulmonary arterial hypertension. Prominent left upper extremity venous structures, favoring left upper extremity av fistula. Large pericardial effusion shown on the CT abdomen from 01/04/2020 has resolved. Mediastinum/Nodes: Scattered small level V lymph nodes in the lower neck. No pathologic thoracic adenopathy. Gas-filled upper esophagus. Lungs/Pleura: Large right and small left pleural effusions, with a notable grade passive atelectasis in the right lung. No appreciable enhancement along the pleural margins to differentiate between exudative in transudative etiology. Bilateral hazy alveolar opacities in both lungs suspicious for edema. Atelectasis and airway plugging in the superior segment left lower lobe with a possible cavitary process in the bandlike density in the left  lower lobe superiorly as shown on image 73/4. There is some air trapping in the upper portion of the superior segment left lower lobe. Musculoskeletal: Degenerative glenohumeral arthropathy, right greater than left. Osteoid and early callus formation along the multiple bilateral anterior rib fractures related to prior CPR. Nondisplaced healing transverse sternal fractures are present in the upper mid, and lower sternal body as shown on image 134/6. CT ABDOMEN PELVIS FINDINGS Hepatobiliary: The liver is initially imaged prior to portal vein opacification. Small gallstone in the gallbladder. Mild gallbladder wall thickening. Difficult to exclude early cirrhosis based on hepatic morphology. Pancreas: Unremarkable Spleen: Unremarkable Adrenals/Urinary Tract: The adrenal glands appear normal. Innumerable renal cysts of varying complexity, without enlargement of the kidneys, favoring acquired cystic disease of dialysis. Stomach/Bowel: The stomach is mildly distended with food material. Peg tube noted. No dilated small or large bowel identified. Vascular/Lymphatic: Mild aortoiliac atherosclerotic vascular disease. Scattered small likely reactive periaortic lymph nodes. Right external iliac node measuring 0.8 cm in short axis on image 100/3 has a fatty hilum. No overtly pathologic adenopathy is identified. Patent portal vein and SMV. Patent splenic vein. Reproductive: Unremarkable Other: Scattered ascites in the abdomen or pelvis. Musculoskeletal: Indirect left inguinal hernia contains adipose tissue and fluid. 3 mm degenerative retrolisthesis of L5-S1 with associated degenerative disc disease. Notably reduced size of the psoas muscle collections IMPRESSION: 1. Large right and small left pleural effusions with passive atelectasis in the right lung. 2. Bilateral hazy alveolar opacities in both lungs suspicious for edema. 3. Atelectasis and airway plugging in the superior segment left lower lobe with a possible cavitary  process in the bandlike density in the left lower lobe superiorly. 4. Moderate to prominent cardiomegaly. 5. Enlarged main pulmonary artery indicating pulmonary arterial hypertension. 6. Scattered ascites in the abdomen or pelvis. 7. Innumerable renal cysts of varying complexity, favoring acquired cystic disease of dialysis. 8. Healing transverse sternal fractures. 9. Cholelithiasis. 10. Indirect left inguinal hernia contains adipose tissue and fluid. 11. Degenerative glenohumeral arthropathy, right greater than left. 12. Prominent left upper extremity venous structures, favoring left upper extremity av fistula. 13. Notably reduced size of the psoas muscle densities shown on the 01/04/2020 exam, likely reflecting resolving psoas muscle hematomas. Aortic Atherosclerosis (ICD10-I70.0). Electronically Signed   By: Van Clines M.D.   On: 02/20/2020 20:13   CT ABDOMEN PELVIS W CONTRAST  Result Date: 02/20/2020 CLINICAL DATA:  Hypoxemia. Abdominal pain. End-stage renal disease. EXAM: CT CHEST, ABDOMEN, AND PELVIS WITH CONTRAST TECHNIQUE: Multidetector CT imaging of the chest, abdomen and pelvis was performed following the standard protocol during bolus administration of intravenous contrast. CONTRAST:  157mL OMNIPAQUE IOHEXOL 300 MG/ML  SOLN COMPARISON:  Multiple exams, including CT abdomen from 01/04/2020 and CT chest from 11/16/2019 FINDINGS: CT CHEST FINDINGS Cardiovascular: Moderate to prominent cardiomegaly. Atherosclerotic calcification of the aortic arch. Enlarged main pulmonary artery indicating pulmonary arterial hypertension. Prominent left upper extremity venous structures, favoring left upper extremity av fistula. Large pericardial effusion shown on the CT abdomen from 01/04/2020 has resolved. Mediastinum/Nodes: Scattered small level V lymph nodes in the lower neck. No pathologic thoracic adenopathy. Gas-filled upper esophagus. Lungs/Pleura: Large right and small left pleural effusions, with a  notable grade passive atelectasis in the right lung. No appreciable enhancement along the pleural margins to differentiate between exudative in transudative etiology. Bilateral hazy alveolar opacities in both lungs suspicious for edema. Atelectasis and airway plugging in the superior segment left lower lobe with a possible cavitary process in the bandlike density in the left lower lobe superiorly as shown on image 73/4. There is some air trapping in the upper portion of the superior segment left lower lobe. Musculoskeletal: Degenerative glenohumeral arthropathy, right greater than left. Osteoid and early callus formation along the multiple bilateral anterior rib fractures related to prior CPR. Nondisplaced healing transverse sternal fractures are present in the upper mid, and lower sternal body as shown on image 134/6. CT ABDOMEN PELVIS FINDINGS Hepatobiliary: The liver is initially imaged prior to portal vein opacification. Small gallstone in the gallbladder. Mild gallbladder wall thickening. Difficult to exclude early cirrhosis based on hepatic morphology. Pancreas: Unremarkable Spleen: Unremarkable Adrenals/Urinary Tract: The adrenal glands appear normal. Innumerable renal cysts of varying complexity, without enlargement of the kidneys, favoring acquired cystic disease of dialysis. Stomach/Bowel: The stomach is mildly distended with food material. Peg tube noted. No dilated small or large bowel identified. Vascular/Lymphatic: Mild aortoiliac atherosclerotic vascular disease. Scattered small likely reactive periaortic lymph nodes. Right external iliac node measuring 0.8 cm in short axis on image 100/3 has a fatty hilum. No overtly pathologic adenopathy is identified. Patent portal vein and SMV. Patent splenic vein. Reproductive: Unremarkable Other: Scattered ascites in the abdomen or pelvis. Musculoskeletal: Indirect left inguinal hernia contains adipose tissue and fluid. 3 mm degenerative retrolisthesis of L5-S1  with associated degenerative disc disease. Notably reduced size of the psoas muscle collections IMPRESSION: 1. Large right and small left pleural effusions with passive atelectasis in the right lung. 2. Bilateral hazy alveolar opacities in both lungs suspicious for edema. 3. Atelectasis and airway plugging in the superior segment left lower lobe with a possible cavitary process in the bandlike density in the left lower lobe superiorly. 4. Moderate to prominent cardiomegaly. 5. Enlarged main pulmonary artery indicating pulmonary arterial hypertension. 6. Scattered ascites in the abdomen or pelvis. 7. Innumerable renal cysts of varying complexity, favoring acquired cystic disease of dialysis. 8. Healing transverse sternal fractures. 9. Cholelithiasis. 10. Indirect left inguinal hernia contains adipose tissue and fluid. 11. Degenerative glenohumeral arthropathy, right greater than left. 12. Prominent left upper extremity venous structures, favoring left upper extremity av fistula. 13. Notably reduced size of the psoas muscle densities shown on the 01/04/2020 exam, likely reflecting resolving psoas muscle hematomas. Aortic Atherosclerosis (ICD10-I70.0). Electronically Signed   By: Van Clines M.D.   On: 02/20/2020 20:13   DG CHEST PORT 1 VIEW  Result Date: 02/20/2020 CLINICAL DATA:  Lethargy. Decreased blood pressure. Shortness of breath. EXAM: PORTABLE CHEST 1 VIEW COMPARISON:  02/12/2020 FINDINGS: Midline trachea. Cardiomegaly accentuated by AP portable technique. Small right pleural effusion. No pneumothorax. Moderate interstitial edema. Probable right hemidiaphragm elevation with adjacent right base airspace disease, similar. Mild left lower  lobe airspace disease is not significantly changed. IMPRESSION: Moderate, increased congestive heart failure. Small right pleural effusion with right greater than left base airspace disease, similar. Most likely atelectasis. Pneumonia at the right lung base cannot be  excluded. Electronically Signed   By: Abigail Miyamoto M.D.   On: 02/20/2020 16:26   Medications: . piperacillin-tazobactam (ZOSYN)  IV 2.25 g (02/21/20 0556)   . amiodarone  200 mg Oral Daily  . apixaban  2.5 mg Oral BID  . ascorbic acid  500 mg Oral BID  . benztropine  1 mg Oral BID  . Chlorhexidine Gluconate Cloth  6 each Topical Q0600  . clonazePAM  0.5 mg Oral TID WC  . colchicine  0.6 mg Oral Daily  . dextromethorphan-guaiFENesin  1 tablet Oral BID  . docusate  100 mg Oral TID  . feeding supplement (PRO-STAT SUGAR FREE 64)  30 mL Oral BID  . folic acid  1 mg Oral Daily  . free water  100 mL Per Tube BID  . gabapentin  100 mg Oral Q8H  . levothyroxine  25 mcg Oral Q0600  . mouth rinse  15 mL Mouth Rinse BID  . Melatonin  3 mg Oral QHS  . metoprolol tartrate  25 mg Oral TID WC  . midodrine  10 mg Oral TID WC  . multivitamin  1 tablet Oral QHS  . predniSONE  10 mg Oral Q breakfast  . senna  2 tablet Oral QHS  . thiamine  100 mg Oral Daily    Dialysis Orders: Prev orders - will need to change significantly on d/c MWF at [facility], 4:15hr, EDW 74.5kg, 2K/2.5Ca, AVF, heparin 5K - Calcitriol 0.55mcg PO q HD. No ESA or venofer.  Assessment/Plan: 1. .Deconditioning: S/p prolonged [facility] admit (12/2-12/23/19) s/p cardiac arrest, then [facility] admit until 2/21. Now CIR with worsening lethargy past several days, see below. 2. Lethargy/acute encephalopathy: Developed worsening lethargy and decreased PO intake on 3/7. K+ 7.3 on 3/8, underwent urgent HD. Somewhat improved after HD, still appearing fatigued today.  Possible component of CO2 retention- CT chest showing large effusion and diffuse edema, planned for HD and thoracentesis today. Internal med recommending holding sedating agents.   3. ESRD:  Previously MWF schedule but changing to TTS at discharge. Last HD 3/8. Had hyperkalemia with K+ >7 on 3/8, K+ now 4.8 after HD. HD today then will continue TTS schedule.  4. HTN/volume:  CXR 2/24and  2/28withpersistent pulm edema. CT 3/8 showed large right and small left pleural effusions with edema. IR consulted for thoracentesis. Soft BP limiting UF with HD, tolerated 1.5L yesterday and planned for HD again today with UF as tolerated. On scheduled midodrine TID.  5. Anemia: Hgb 12.7, no ESA indicated.  6. Secondary hyperparathyroidism:Caat goal, phosvariable, 2.6 on 3/5.No binders/VDRA. 7. Hx cardiac arrest 8. Hx PE: On Eliquis 9. A-fib: On Eliquis + amiodarone 10. Hx pericardial effusion:colchicine +prednisone 10mg  QD. 11. HFrEF (<20%): volume management as above 12. 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 On gabapentin 100mg  TID which is an appropriate renal dose, caution using higher doses. 14. Constipation: large stool burdern on KUB 3/5. Refused enema and was given mag citrate. No abdominal pain at present. Recommendavoidingmagnesium containing products as able in ESRD patients.  Anice Paganini, PA-C 02/21/2020, 11:46 AM  [facility] Pager: [phone]  Pt seen, examined and agree w A/P as above. Chest CT showed pleural effusions and sig edema. Plan  HD today , may need extra HD.  Kelly Splinter  MD 02/21/2020, 12:12 PM

## 2020-02-21 NOTE — Plan of Care (Signed)
  Problem: Consults Goal: RH GENERAL PATIENT EDUCATION Description: See Patient Education module for education specifics. Outcome: Progressing   Problem: RH SKIN INTEGRITY Goal: RH STG ABLE TO PERFORM INCISION/WOUND CARE W/ASSISTANCE Description: STG Able To Perform Incision/Wound Care With Assistance. Mod Outcome: Not Progressing   Problem: RH SAFETY Goal: RH STG ADHERE TO SAFETY PRECAUTIONS W/ASSISTANCE/DEVICE Description: STG Adhere to Safety Precautions With Assistance/Device. Mod Outcome: Not Progressing

## 2020-02-21 NOTE — Progress Notes (Signed)
Occupational Therapy Session Note  Patient Details  Name: [name] [name] MRN: [id number] Date of Birth: 05-Nov-1966  Today's Date: 02/21/2020 OT Individual Time: 1287-8676 OT Individual Time Calculation (min): 40 min   Short Term Goals: Week 3:  OT Short Term Goal 1 (Week 3): LTG=STG 2/2 ELOS  Skilled Therapeutic Interventions/Progress Updates:    Pt greeted sitting in recliner eating breakfast. Pt had spilled eggs on his lap and was trying to scoop them out of lap with his spoon.  OT provided pt with paper towel to place dropped eggs into. OT then had pt stand to wipe off his lap from more eggs. Sit<>stand from recliner with increased time and effort and CGA to achieve stand. Pt was then able to remove 1 UE from recliner arm rest to brush off lap. Pt returned to sitting and finished eating egg sandwich. Nutritional services entered to get meal orders. OT worked on pt talking loudly to nutritional services to order meals. Pt frustrated bc he is supposed to leave today and upset when OT told him he should order meals in case he does not leave for medical reasons. Pt even more frustrated, but finished ordering meals with OT assistance. OT had pt don his Darco shoes and explained that we would have conference today an discuss therapy goals and medical status. Pt left seated in recliner with chair alarm on, call bell in reach, and needs met.   Therapy Documentation Precautions:  Precautions Precautions: Fall Precaution Comments: Peg, Darco shoes Restrictions Weight Bearing Restrictions: (P) No Pain: Pt reports pain in B toes, but unable to give number, Rest and repositioned for comfort  Therapy/Group: Individual Therapy  Valma Cava 02/21/2020, 8:19 AM

## 2020-02-21 NOTE — Progress Notes (Signed)
Social Work Patient ID: [name] [name], male   DOB: 05/26/1966, 54 y.o.   MRN: [id number]   Per medical team, pt will not discharge today due to medical concerns. Unsure on d/c date at his time. Pt will have dialysis today after speaking with nursing.   SW called pt father [name] [name] [phone]) to provide updates. SW spoke with [name]. [phone]) to inform on pt delay in d/c. Reports pt new dialysis scheduled TTRS will begin today since pt is not discharging. SW will update on pt discharge. SW informed [name] on above. Reports pt will now have PT/OT/ST/SN and waiting on updates if they can add an aide for pt.    Loralee Pacas, MSW, [place] Office: [phone] Cell: [phone] Fax: [phone]

## 2020-02-21 NOTE — Progress Notes (Signed)
Patient just arrived back from hemodialysis with transport.

## 2020-02-21 NOTE — Progress Notes (Signed)
Speech Language Pathology Daily Session Note  Patient Details  Name: [name] [name] MRN: [id number] Date of Birth: 1966/04/06  Today's Date: 02/21/2020 SLP Individual Time: 0900-0950 SLP Individual Time Calculation (min): 50 min  Missed Time: 10 minutes, lethargy/fatigue   Short Term Goals: Week 3: SLP Short Term Goal 1 (Week 3): STGs=LTGs due ELOS  Skilled Therapeutic Interventions: Skilled treatment session focused on cognitive goals. Upon arrival, patient was upright in the recliner but appeared lethargic. SLP facilitated session by providing total A for recall of events from yesterday including going to dialysis. Patient asking questions in regard why he can't go home today, PA aware and came and educated patient on current medical issues, he verbalized understanding but will need reinforcement of information. SLP also initiated a re-evaluation of his current cognitive functioning in which patient demonstrated deficits in attention and recall in which patient was unable to complete due to lethargy. Patient unable to maintain arousal, therefore, patient missed remaining 10 minutes of session. Patient left upright in recliner with alarm on and all needs within reach. Continue with current plan of care.      Pain No/Denies Pain   Therapy/Group: Individual Therapy  Tiarra Anastacio 02/21/2020, 10:05 AM

## 2020-02-21 NOTE — Progress Notes (Signed)
Physical Therapy Note  Patient Details  Name: [name] [name] MRN: [id number] Date of Birth: 1966-04-23 Today's Date: 02/21/2020    Attempted to see pt for scheduled PT session but nurse reports pt is leaving the unit for thoracentesis and then dialysis. Pt missed 60 minutes of skilled PT treatment.    [name] 02/21/2020, 3:21 PM

## 2020-02-21 NOTE — Procedures (Signed)
PROCEDURE SUMMARY:  Successful US guided right thoracentesis. Yielded 450 mL of amber fluid. Patient tolerated procedure well. No immediate complications. EBL = trace   Post procedure chest X-ray reveals no pneumothorax  Hollyann Pablo S Terrica Duecker PA-C 02/21/2020 4:13 PM

## 2020-02-21 NOTE — Progress Notes (Signed)
Patient to thoracentesis
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
## 2020-02-21 NOTE — Progress Notes (Signed)
Occupational Therapy Note  Patient Details  Name: [name] [name] MRN: [id number] Date of Birth: [birth year]  Attempted to make up missed time with pt, however pt received upright in recliner lethargic and with c/o pain pain in feet.  Encouraged pt to engage in any functional task or even reposition to decrease pain in feet.  Pt declined any attempts.  Encouraged return to bed to allow RN to change dressings and in preparation for HD.  Pt again declined, remained upright in recliner with seat belt alarm on and all needs in reach.  Simonne Come 02/21/2020, 11:58 AM

## 2020-02-22 ENCOUNTER — Inpatient Hospital Stay (HOSPITAL_COMMUNITY): Payer: Medicare Other | Admitting: Occupational Therapy

## 2020-02-22 ENCOUNTER — Inpatient Hospital Stay (HOSPITAL_COMMUNITY): Payer: Medicare Other | Admitting: Physical Therapy

## 2020-02-22 ENCOUNTER — Inpatient Hospital Stay (HOSPITAL_COMMUNITY): Payer: Medicare Other | Admitting: Speech Pathology

## 2020-02-22 ENCOUNTER — Inpatient Hospital Stay (HOSPITAL_COMMUNITY): Payer: Medicare Other

## 2020-02-22 ENCOUNTER — Telehealth: Payer: Self-pay

## 2020-02-22 LAB — CBC
HCT: 38.9 % — ABNORMAL LOW (ref 39.0–52.0)
Hemoglobin: 12.2 g/dL — ABNORMAL LOW (ref 13.0–17.0)
MCH: 28 pg (ref 26.0–34.0)
MCHC: 31.4 g/dL (ref 30.0–36.0)
MCV: 89.2 fL (ref 80.0–100.0)
Platelets: 167 10*3/uL (ref 150–400)
RBC: 4.36 MIL/uL (ref 4.22–5.81)
RDW: 23 % — ABNORMAL HIGH (ref 11.5–15.5)
WBC: 16 10*3/uL — ABNORMAL HIGH (ref 4.0–10.5)
nRBC: 1.6 % — ABNORMAL HIGH (ref 0.0–0.2)

## 2020-02-22 LAB — RENAL FUNCTION PANEL
Albumin: 3 g/dL — ABNORMAL LOW (ref 3.5–5.0)
Anion gap: 16 — ABNORMAL HIGH (ref 5–15)
BUN: 34 mg/dL — ABNORMAL HIGH (ref 6–20)
CO2: 23 mmol/L (ref 22–32)
Calcium: 8.5 mg/dL — ABNORMAL LOW (ref 8.9–10.3)
Chloride: 94 mmol/L — ABNORMAL LOW (ref 98–111)
Creatinine, Ser: 4.13 mg/dL — ABNORMAL HIGH (ref 0.61–1.24)
GFR calc Af Amer: 18 mL/min — ABNORMAL LOW (ref >60)
GFR calc non Af Amer: 16 mL/min — ABNORMAL LOW (ref >60)
Glucose, Bld: 126 mg/dL — ABNORMAL HIGH (ref 70–99)
Phosphorus: 5.3 mg/dL — ABNORMAL HIGH (ref 2.5–4.6)
Potassium: 4.1 mmol/L (ref 3.5–5.1)
Sodium: 133 mmol/L — ABNORMAL LOW (ref 135–145)

## 2020-02-22 MED ORDER — CHLORHEXIDINE GLUCONATE CLOTH 2 % EX PADS
6.0000 | MEDICATED_PAD | Freq: Every day | CUTANEOUS | Status: DC
  Administered 2020-02-22 – 2020-02-23 (×2): 6 via TOPICAL

## 2020-02-22 NOTE — Progress Notes (Signed)
Speech Language Pathology Daily Session Note  Patient Details  Name: Tywon Niday MRN: [id number] Date of Birth: 26-Feb-1966  Today's Date: 02/22/2020 SLP Individual Time: 0900-0930 SLP Individual Time Calculation (min): 30 min and Today's Date: 02/22/2020 SLP Missed Time: 30 Minutes Missed Time Reason: Patient fatigue  Short Term Goals: Week 3: SLP Short Term Goal 1 (Week 3): STGs=LTGs due ELOS  Skilled Therapeutic Interventions: Skilled treatment session focused on cognitive goals. Upon arrival, patient was asleep in bed. Patient required Mod verbal tactile and verbal cues for arousal but was unable to maintain his eyes being open for more than 30-60 second intervals. Patient had not eaten breakfast and reported that he would be willing to eat breakfast. However, despite more than a reasonable amount of time, Max verbal cues and multiple attempts, patient unable to sit EOB due to pain in his feet. RN aware and administered medications. Patient declined sitting upright in bed and reported he no longer wanted his breakfast. Patient unable to efficiently participate in treatment session due to fatigue, therefore, patient missed remaining 30 minutes of session. Of note, patient with increased vocal intensity today and was ~90% intelligible at the phrase level.      Pain Pain Assessment Pain Scale: 0-10 Pain Score: 6  Pain Type: Acute pain Pain Location: Foot Pain Orientation: Right;Left Pain Descriptors / Indicators: Aching Pain Frequency: Intermittent Pain Onset: On-going Pain Intervention(s): Medication (See eMAR);Repositioned  Therapy/Group: Individual Therapy  Donis Kotowski 02/22/2020, 9:44 AM

## 2020-02-22 NOTE — Progress Notes (Signed)
Physical Therapy Note  Patient Details  Name: [name] [name] MRN: [id number] Date of Birth: February 18, 1966 Today's Date: 02/22/2020    Pt's plan of care adjusted back to 15/7 after speaking with care team as pt currently unable to tolerate current therapy schedule with OT, PT, and SLP.     [name] 02/22/2020, 11:16 AM

## 2020-02-22 NOTE — Progress Notes (Signed)
Renal Navigator appreciates update from CIR CSW/A. Barbra Sarks with new tentative d/c date of Monday, 3/15. Renal Navigator informed OP HD clinic that patient will not be back to his clinic tomorrow, as initially thought, and provided them with new tentative re-start date in order to provide continuity of care.  Renal Navigator to continue following through discharge.  Alphonzo Cruise, [place] Renal Navigator [phone]

## 2020-02-22 NOTE — Progress Notes (Signed)
Soaps suds enema given per order. Pt had medium bowel movement. No signs of distress.
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
## 2020-02-22 NOTE — Progress Notes (Signed)
Occupational Therapy Session Note  Patient Details  Name: [name] [name] MRN: [id number] Date of Birth: [birth year]  Today's Date: 02/22/2020 OT Individual Time: 6314-9702 OT Individual Time Calculation (min): 38 min  20 missed minutes secondary to lethargy and pain  Short Term Goals: Week 3:  OT Short Term Goal 1 (Week 3): LTG=STG 2/2 ELOS  Skilled Therapeutic Interventions/Progress Updates:    Upon entering the room, pt supine in bed and sleeping soundly. RN reports pt coming back to unit late last night from dialysis and did not sleep last night. Pt initially refusing OT intervention but agreeable to attempt to sit on EOB to eat breakfast. Pt required mod A to roll onto L side and mod A to come to EOB. Pt required min guard for balance sitting EOB with posterior bias noted. Pt initiated opening top of breakfast tray but then declined to eat or drink any of his food. Pt declined ordering something else and reported just wanting to return to bed. Pt did not want therapist to touch LEs to assist with getting him back into bed. Pt attempting for 15 minutes to return to supine secondary to this before agreeable to allowing therapist to assist. Mod A for B LEs and then scooting pt back up in bed. Bed alarm activated. Pt remains very lethargic throughout session. Call bell within reach upon exiting the room.   Therapy Documentation Precautions:  Precautions Precautions: Fall Precaution Comments: Peg, Darco shoes Restrictions Weight Bearing Restrictions: No General: General OT Amount of Missed Time: 20 Minutes   Therapy/Group: Individual Therapy  Gypsy Decant 02/22/2020, 8:41 AM

## 2020-02-22 NOTE — Patient Care Conference (Signed)
Inpatient RehabilitationTeam Conference and Plan of Care Update Date: 02/21/2020   Time: 10:40 AM    Patient Name: [name] [name]      Medical Record Number: [id number]  Date of Birth: [birth year] Sex: Male         Room/Bed: 4M02C/4M02C-01 Payor Info: Payor: MEDICARE / Plan: MEDICARE PART A AND B / Product Type: *No Product type* /    Admit Date/Time:  02/03/2020  5:40 PM  Primary Diagnosis:  Encephalopathy  Patient Active Problem List   Diagnosis Date Noted  . Lethargy   . Sleep disturbance   . Generalized anxiety disorder   . Dysphagia   . Somnolence   . Encephalopathy   . Debility   . ESRD on dialysis ([place])   . Dry gangrene ([place])   . Hypotension   . S/P percutaneous endoscopic gastrostomy (PEG) tube placement ([place])   . Pericardial effusion   . Atrial fibrillation ([place])   . Acute on chronic respiratory failure with hypoxia ([place])   . End stage renal disease on dialysis ([place])   . Acute on chronic systolic and diastolic heart failure, NYHA class 4 ([place])   . Chronic atrial fibrillation ([place])   . Acute pulmonary embolism without acute cor pulmonale (HCC)   . Tracheostomy status ([place])   . Adult failure to thrive   . Ventilator dependent ([place])   . Chronic respiratory failure with hypoxia ([place])   . Anoxic encephalopathy ([place])   . Tracheostomy in place [facility])   . Endotracheally intubated   . Palliative care by specialist   . Pressure injury of skin 11/19/2019  . Acute respiratory failure ([place])   . ESRD on hemodialysis ([place])   . Cardiac arrest ([place]) 11/16/2019  . Paroxysmal atrial fibrillation (HCC)   . Superficial venous thrombosis of right arm 11/05/2018  . Nonischemic cardiomyopathy ([place]) 11/03/2018  . Essential hypertension 11/03/2018  . ESRD needing dialysis ([place]) 10/26/2018  . Prolonged Q-T interval on ECG 05/18/2017  . Hernia, inguinal, right 08/05/2016  . Acute on chronic systolic CHF (congestive heart failure) ([place])   . Solitary pulmonary nodule 10/10/2015  .  Chronic combined systolic and diastolic heart failure ([place]) 04/18/2014  . Anemia due to blood loss, acute 07/15/2012  . Hypocalcemia 07/15/2012  . Cigarette smoker 11/26/2010    Expected Discharge Date: Expected Discharge Date: 02/24/20  Team Members Present: Physician leading conference: Dr. Leeroy Cha Care Coodinator Present: Loralee Pacas, LCSWA;Genie Shaheen Mende, RN, MSN Nurse Present: Other (comment)(Marie Poynter, RN) PT Present: Lavone Nian, PT OT Present: Cherylynn Ridges, OT SLP Present: Weston Anna, SLP PPS Coordinator present : Gunnar Fusi, Novella Olive, PT     Current Status/Progress Goal Weekly Team Focus  Bowel/Bladder   Pt is continent of B&B. LBM 3/10. Pt is anuric r/t dialysis  Maintain continence of Bowel  assess continence every shift/prn   Swallow/Nutrition/ Hydration             ADL's   Supervision overall  Supervision  dc planning, activity tolerance, pt/family education,   Mobility   supervision transfers & gait, min assist car transfer, min assist stair negotiation, medical issues this week & lethargic  supervision overall except CGA bed<>chair & min assist car transfer & stairs with rails per home setup  activity tolerance, transfers, bed mobility, gait, endurance, stairs, balance, d/c planning, pt/family education   Communication   Min-Mod A  Mod A  increased vocal intensity   Safety/Cognition/ Behavioral Observations  Min-Mod A  Min A  attention, recall with  use of strategies, safety   Pain   c/o pain to bilateral feel  Keep pain 3 or less  assess q shift/prn   Skin   PEG tube LUQ, abrasions bilateral knees, gangrenous toes with wound care orders, Left AV fistula  Prevent further skin breakdown  assess q shift/prn    Rehab Goals Patient on target to meet rehab goals: Yes *See Care Plan and progress notes for long and short-term goals.     Barriers to Discharge  Current Status/Progress Possible Resolutions Date Resolved    Nursing                  PT  Medical stability;Wound Care;Hemodialysis;New oxygen;Lack of/limited family support;Behavior                 OT                  SLP                SW Decreased caregiver support;Lack of/limited family support;Hemodialysis Pt father is the primary support with transportation for both pt and his wife to/from dialysis. Pt new diaylsis schedule is  T/TR/S            Discharge Planning/Teaching Needs:  D/c to his parents home.  Family education as recommended by therapy   Team Discussion: Pleural effusion R>L, may tap today, ? Needs thoracentesis, HD again today, abd distended, enema today after HD.  RN more alert, has tube drainage, BM today, father at bedside.  OT looks better, S goals, sit to stand S.  PT S walker last Friday, was at goal level.  SLP cognition worse, poor recall fatigued, cannot stay awake, change in HD to T-TH-Sat.  SW to call about fam ed with Dad.  Anticipate DC ?02/24/20   Revisions to Treatment Plan: N/A     Medical Summary Current Status: Short of breath, abdomen distended, hyperkalemic, hospitalist and nephrology following. Weekly Focus/Goal: Thoracentesis by IR today, HD today, enema after HD--he is finally agreeable.  Barriers to Discharge: Medical stability;Hemodialysis;Behavior   Possible Resolutions to Barriers: Continued medical management: requires thoracentesis today, additional HD session, enema for large stool burden.   Continued Need for Acute Rehabilitation Level of Care: The patient requires daily medical management by a physician with specialized training in physical medicine and rehabilitation for the following reasons: Direction of a multidisciplinary physical rehabilitation program to maximize functional independence : Yes Medical management of patient stability for increased activity during participation in an intensive rehabilitation regime.: Yes Analysis of laboratory values and/or radiology reports with any  subsequent need for medication adjustment and/or medical intervention. : Yes   I attest that I was present, lead the team conference, and concur with the assessment and plan of the team.   Retta Diones 02/23/2020, 11:01 AM   Team conference was held via web/ teleconference due to [place] - 19

## 2020-02-22 NOTE — Telephone Encounter (Signed)
[name] called because he received a message in regards to an upcoming appointment with Dr. Radford Pax. [name] thought it was for him but after discussing further we figured out the message must have been for his son. He states his son is currently hospitalized and is supposed to be discharged on Monday. He had an appointment scheduled with Oda Kilts, PA for Monday that I have cancelled. The patient will call back after being discharged to arrange appointments.

## 2020-02-22 NOTE — Progress Notes (Signed)
Physical Therapy Note  Patient Details  Name: Kelden Lavallee MRN: [id number] Date of Birth: January 07, 1966 Today's Date: 02/22/2020   Attempted to see pt to make up missed time, however pt off unit for HD and unavailable. Will attempt to make up time as schedule allows.   Betsey Holiday Lyn Hollingshead PT, DPT   02/22/2020, 3:17 PM

## 2020-02-22 NOTE — Progress Notes (Signed)
Social Work Patient ID: [name] [name], male   DOB: 04-25-1966, 55 y.o.   MRN: [id number]  Per medical team, pt will now have daily dialysis with a possible d/c date on Monday. D/c need and date TBD.  SW informed therapy team on changes. SW called [place] [phone]), [name] [phone]), and pt  father [name] [phone]) to inform on above.   Loralee Pacas, MSW, [place] Office: [phone] Cell: [phone] Fax: [phone]

## 2020-02-22 NOTE — Plan of Care (Signed)
  Problem: RH BOWEL ELIMINATION Goal: RH STG MANAGE BOWEL WITH ASSISTANCE Description: STG Manage Bowel with Assistance. mod Outcome: Progressing   Problem: RH SKIN INTEGRITY Goal: RH STG SKIN FREE OF INFECTION/BREAKDOWN Description: Free of breakdown and infection with mod assist Outcome: Progressing Goal: RH STG ABLE TO PERFORM INCISION/WOUND CARE W/ASSISTANCE Description: STG Able To Perform Incision/Wound Care With Assistance. Mod Outcome: Progressing Flowsheets (Taken 02/22/2020 1636) STG: Pt will be able to perform incision/wound care with assistance: 2-Maximum assistance   Problem: RH SAFETY Goal: RH STG ADHERE TO SAFETY PRECAUTIONS W/ASSISTANCE/DEVICE Description: STG Adhere to Safety Precautions With Assistance/Device. Mod Outcome: Progressing Flowsheets (Taken 02/22/2020 1636) STG:Pt will adhere to safety precautions with assistance/device: 3-Moderate assistance   Problem: RH PAIN MANAGEMENT Goal: RH STG PAIN MANAGED AT OR BELOW PT'S PAIN GOAL Description: Less than 4 Outcome: Progressing

## 2020-02-22 NOTE — Progress Notes (Signed)
Physical Therapy Weekly Progress Note  Patient Details  Name: [name] [name] MRN: [id number] Date of Birth: 1966/12/10  Beginning of progress report period: February 15, 2020 End of progress report period: February 22, 2020  Today's Date: 02/22/2020    Pt was making good progress towards LTG's and was prepared to d/c at supervision level with RW but then experienced medical decline. Pt has now experienced a significant medical decline with new plans for daily dialysis. Pt's schedule has been changed to 15/7. Pt would benefit from skilled PT treatment to focus on strengthening, endurance, transfers, bed mobility, & gait when pt can participate more.   Patient continues to demonstrate the following deficits muscle weakness, decreased cardiorespiratoy endurance and decreased oxygen support, decreased coordination, decreased awareness, decreased problem solving, decreased safety awareness and decreased memory, and decreased standing balance and decreased balance strategies and therefore will continue to benefit from skilled PT intervention to increase functional independence with mobility.  Patient was progressing towards LTG's until he experienced a medical decline..  Continue plan of care.  PT Short Term Goals Week 3:  PT Short Term Goal 1 (Week 3): STG = LTG due to estimated d/c date. Week 4:  PT Short Term Goal 1 (Week 4): STG = LTG due to estimated d/c date.  Skilled Therapeutic Interventions/Progress Updates:  Pt received asleep in bed with father present but stating he does not believe pt is aware of his presence. Therapist & pt's father provided dependent assist with repositioning pt in bed & scooting to [name]. Pt does grimace when therapist touches his BLE to reposition them. Nasal cannula in 1 nostril & therapist adjusted it, SPO2 = 96-97% on 2L/min, HR = 78 bpm. Therapist educated pt's father on change in schedule to 15/7 and potential to QD if necessary, but hopes that pt will perk up once he  medically improves. Also educated him on pt's d/c date TBD. Pt left in bed with alarm set, call bell in reach, father present in room. Pt missed 75 minutes of skilled PT treatment 2/2 inability to functionally participate 2/2 lethargy.  Therapy Documentation Precautions:  Precautions Precautions: Fall Precaution Comments: Peg, [place] shoes Restrictions Weight Bearing Restrictions: No   General: PT Amount of Missed Time (min): 75 Minutes PT Missed Treatment Reason: Patient fatigue   Therapy/Group: Individual Therapy  [name] 02/22/2020, 11:05 AM

## 2020-02-22 NOTE — Progress Notes (Addendum)
[facility] Progress Note   Subjective:   Patient seen in room, working with speech therapy. Underwent thoracentesis on 3/9 with 450 cc fluid removed. Still reporting dyspnea. Denies CP, dizziness, abdominal pain, N/V. Had BM with enema last night.   Objective Vitals:   02/21/20 2200 02/21/20 2207 02/21/20 2252 02/22/20 0415  BP: (!) 127/91 127/60 (!) 128/98 113/78  Pulse: 68 66 82 96  Resp:  18 20 18   Temp:  97.8 F (36.6 C) 98 F (36.7 C) 98.6 F (37 C)  TempSrc:  Oral Oral Oral  SpO2:  100% 91% 95%  Weight:  66.7 kg  67 kg  Height:       Physical Exam General: Well developed, chronically ill appearing male in NAD Heart: RRR, no murmurs, rubs or gallops Lungs: + rales left lower lobe, decreased breath sounds right Abdomen:Soft, nondistended, +BS, +peg tube, non-tender to palpation Extremities: trace noted bilateral lower extremities Dialysis Access: LUE AVF + bruit  Additional Objective Labs: Basic Metabolic Panel: Recent Labs  Lab 02/15/20 1310 02/15/20 1310 02/17/20 1606 02/17/20 1606 02/20/20 1201 02/21/20 0532 02/21/20 1322  NA 133*   < > 136   < > 133* 134* 133*  K 4.9   < > 3.7   < > 7.3* 4.8 5.1  CL 91*   < > 95*   < > 90* 91* 91*  CO2 23   < > 25   < > 16* 21* 18*  GLUCOSE 112*   < > 102*   < > 67* 83 100*  BUN 112*   < > 40*   < > 118* 57* 64*  CREATININE 6.75*   < > 3.05*   < > 7.95* 5.00* 5.36*  CALCIUM 8.7*   < > 8.6*   < > 8.7* 8.8* 8.6*  PHOS 4.9*  --  2.6  --   --   --  6.3*   < > = values in this interval not displayed.   Liver Function Tests: Recent Labs  Lab 02/17/20 1606 02/20/20 1201 02/21/20 1322  AST  --  61*  --   ALT  --  63*  --   ALKPHOS  --  148*  --   BILITOT  --  1.4*  --   PROT  --  7.3  --   ALBUMIN 3.9 3.7 3.6   CBC: Recent Labs  Lab 02/15/20 1311 02/15/20 1311 02/17/20 1606 02/17/20 1606 02/20/20 1201 02/21/20 0532 02/21/20 1322  WBC 7.4   < > 8.6   < > 15.8* 15.2* 14.2*  NEUTROABS  --   --    --   --  12.7* 13.2*  --   HGB 12.4*   < > 12.7*   < > 13.9 12.7* 12.7*  HCT 40.6   < > 40.4   < > 46.1 40.8 40.8  MCV 88.6  --  87.3  --  91.7 88.5 88.7  PLT 229   < > 254   < > 293 219 218   < > = values in this interval not displayed.   Blood Culture    Component Value Date/Time   SDES BLOOD RIGHT HAND 12/17/2019 1520   SPECREQUEST  12/17/2019 1520    BOTTLES DRAWN AEROBIC ONLY Blood Culture results may not be optimal due to an inadequate volume of blood received in culture bottles   CULT  12/17/2019 1520    NO GROWTH 5 DAYS Performed at [facility], [place]  [address], [place], [place] [address]    REPTSTATUS 12/22/2019 FINAL 12/17/2019 1520    CBG: Recent Labs  Lab 02/20/20 1601 02/20/20 1830  GLUCAP 99 84    Studies/Results: DG Chest 1 View  Result Date: 02/21/2020 CLINICAL DATA:  Status post right thoracentesis, 450 cc of right side. EXAM: CHEST  1 VIEW COMPARISON:  Radiograph and CT yesterday. FINDINGS: Decreased right pleural effusion after thoracentesis. Improving aeration of the right lung base. Persistent right pleural fluid which tracks into the minor fissure. No visualized pneumothorax. Cardiomegaly and minimal left pleural effusion again seen. Interstitial thickening suspicious for pulmonary edema. No new airspace disease. IMPRESSION: 1. Decreased right pleural effusion after thoracentesis with improving aeration at the right lung base. No visualized pneumothorax. 2. Stable cardiomegaly, probable small left pleural effusion and interstitial thickening likely edema. Electronically Signed   By: Keith Rake M.D.   On: 02/21/2020 15:12   CT CHEST W CONTRAST  Result Date: 02/20/2020 CLINICAL DATA:  Hypoxemia. Abdominal pain. End-stage renal disease. EXAM: CT CHEST, ABDOMEN, AND PELVIS WITH CONTRAST TECHNIQUE: Multidetector CT imaging of the chest, abdomen and pelvis was performed following the standard protocol during bolus administration of intravenous contrast.  CONTRAST:  113mL OMNIPAQUE IOHEXOL 300 MG/ML  SOLN COMPARISON:  Multiple exams, including CT abdomen from 01/04/2020 and CT chest from 11/16/2019 FINDINGS: CT CHEST FINDINGS Cardiovascular: Moderate to prominent cardiomegaly. Atherosclerotic calcification of the aortic arch. Enlarged main pulmonary artery indicating pulmonary arterial hypertension. Prominent left upper extremity venous structures, favoring left upper extremity av fistula. Large pericardial effusion shown on the CT abdomen from 01/04/2020 has resolved. Mediastinum/Nodes: Scattered small level V lymph nodes in the lower neck. No pathologic thoracic adenopathy. Gas-filled upper esophagus. Lungs/Pleura: Large right and small left pleural effusions, with a notable grade passive atelectasis in the right lung. No appreciable enhancement along the pleural margins to differentiate between exudative in transudative etiology. Bilateral hazy alveolar opacities in both lungs suspicious for edema. Atelectasis and airway plugging in the superior segment left lower lobe with a possible cavitary process in the bandlike density in the left lower lobe superiorly as shown on image 73/4. There is some air trapping in the upper portion of the superior segment left lower lobe. Musculoskeletal: Degenerative glenohumeral arthropathy, right greater than left. Osteoid and early callus formation along the multiple bilateral anterior rib fractures related to prior CPR. Nondisplaced healing transverse sternal fractures are present in the upper mid, and lower sternal body as shown on image 134/6. CT ABDOMEN PELVIS FINDINGS Hepatobiliary: The liver is initially imaged prior to portal vein opacification. Small gallstone in the gallbladder. Mild gallbladder wall thickening. Difficult to exclude early cirrhosis based on hepatic morphology. Pancreas: Unremarkable Spleen: Unremarkable Adrenals/Urinary Tract: The adrenal glands appear normal. Innumerable renal cysts of varying  complexity, without enlargement of the kidneys, favoring acquired cystic disease of dialysis. Stomach/Bowel: The stomach is mildly distended with food material. Peg tube noted. No dilated small or large bowel identified. Vascular/Lymphatic: Mild aortoiliac atherosclerotic vascular disease. Scattered small likely reactive periaortic lymph nodes. Right external iliac node measuring 0.8 cm in short axis on image 100/3 has a fatty hilum. No overtly pathologic adenopathy is identified. Patent portal vein and SMV. Patent splenic vein. Reproductive: Unremarkable Other: Scattered ascites in the abdomen or pelvis. Musculoskeletal: Indirect left inguinal hernia contains adipose tissue and fluid. 3 mm degenerative retrolisthesis of L5-S1 with associated degenerative disc disease. Notably reduced size of the psoas muscle collections IMPRESSION: 1. Large right and small left pleural effusions with  passive atelectasis in the right lung. 2. Bilateral hazy alveolar opacities in both lungs suspicious for edema. 3. Atelectasis and airway plugging in the superior segment left lower lobe with a possible cavitary process in the bandlike density in the left lower lobe superiorly. 4. Moderate to prominent cardiomegaly. 5. Enlarged main pulmonary artery indicating pulmonary arterial hypertension. 6. Scattered ascites in the abdomen or pelvis. 7. Innumerable renal cysts of varying complexity, favoring acquired cystic disease of dialysis. 8. Healing transverse sternal fractures. 9. Cholelithiasis. 10. Indirect left inguinal hernia contains adipose tissue and fluid. 11. Degenerative glenohumeral arthropathy, right greater than left. 12. Prominent left upper extremity venous structures, favoring left upper extremity av fistula. 13. Notably reduced size of the psoas muscle densities shown on the 01/04/2020 exam, likely reflecting resolving psoas muscle hematomas. Aortic Atherosclerosis (ICD10-I70.0). Electronically Signed   By: Van Clines M.D.   On: 02/20/2020 20:13   CT ABDOMEN PELVIS W CONTRAST  Result Date: 02/20/2020 CLINICAL DATA:  Hypoxemia. Abdominal pain. End-stage renal disease. EXAM: CT CHEST, ABDOMEN, AND PELVIS WITH CONTRAST TECHNIQUE: Multidetector CT imaging of the chest, abdomen and pelvis was performed following the standard protocol during bolus administration of intravenous contrast. CONTRAST:  174mL OMNIPAQUE IOHEXOL 300 MG/ML  SOLN COMPARISON:  Multiple exams, including CT abdomen from 01/04/2020 and CT chest from 11/16/2019 FINDINGS: CT CHEST FINDINGS Cardiovascular: Moderate to prominent cardiomegaly. Atherosclerotic calcification of the aortic arch. Enlarged main pulmonary artery indicating pulmonary arterial hypertension. Prominent left upper extremity venous structures, favoring left upper extremity av fistula. Large pericardial effusion shown on the CT abdomen from 01/04/2020 has resolved. Mediastinum/Nodes: Scattered small level V lymph nodes in the lower neck. No pathologic thoracic adenopathy. Gas-filled upper esophagus. Lungs/Pleura: Large right and small left pleural effusions, with a notable grade passive atelectasis in the right lung. No appreciable enhancement along the pleural margins to differentiate between exudative in transudative etiology. Bilateral hazy alveolar opacities in both lungs suspicious for edema. Atelectasis and airway plugging in the superior segment left lower lobe with a possible cavitary process in the bandlike density in the left lower lobe superiorly as shown on image 73/4. There is some air trapping in the upper portion of the superior segment left lower lobe. Musculoskeletal: Degenerative glenohumeral arthropathy, right greater than left. Osteoid and early callus formation along the multiple bilateral anterior rib fractures related to prior CPR. Nondisplaced healing transverse sternal fractures are present in the upper mid, and lower sternal body as shown on image 134/6. CT  ABDOMEN PELVIS FINDINGS Hepatobiliary: The liver is initially imaged prior to portal vein opacification. Small gallstone in the gallbladder. Mild gallbladder wall thickening. Difficult to exclude early cirrhosis based on hepatic morphology. Pancreas: Unremarkable Spleen: Unremarkable Adrenals/Urinary Tract: The adrenal glands appear normal. Innumerable renal cysts of varying complexity, without enlargement of the kidneys, favoring acquired cystic disease of dialysis. Stomach/Bowel: The stomach is mildly distended with food material. Peg tube noted. No dilated small or large bowel identified. Vascular/Lymphatic: Mild aortoiliac atherosclerotic vascular disease. Scattered small likely reactive periaortic lymph nodes. Right external iliac node measuring 0.8 cm in short axis on image 100/3 has a fatty hilum. No overtly pathologic adenopathy is identified. Patent portal vein and SMV. Patent splenic vein. Reproductive: Unremarkable Other: Scattered ascites in the abdomen or pelvis. Musculoskeletal: Indirect left inguinal hernia contains adipose tissue and fluid. 3 mm degenerative retrolisthesis of L5-S1 with associated degenerative disc disease. Notably reduced size of the psoas muscle collections IMPRESSION: 1. Large right and small left pleural effusions with  passive atelectasis in the right lung. 2. Bilateral hazy alveolar opacities in both lungs suspicious for edema. 3. Atelectasis and airway plugging in the superior segment left lower lobe with a possible cavitary process in the bandlike density in the left lower lobe superiorly. 4. Moderate to prominent cardiomegaly. 5. Enlarged main pulmonary artery indicating pulmonary arterial hypertension. 6. Scattered ascites in the abdomen or pelvis. 7. Innumerable renal cysts of varying complexity, favoring acquired cystic disease of dialysis. 8. Healing transverse sternal fractures. 9. Cholelithiasis. 10. Indirect left inguinal hernia contains adipose tissue and fluid. 11.  Degenerative glenohumeral arthropathy, right greater than left. 12. Prominent left upper extremity venous structures, favoring left upper extremity av fistula. 13. Notably reduced size of the psoas muscle densities shown on the 01/04/2020 exam, likely reflecting resolving psoas muscle hematomas. Aortic Atherosclerosis (ICD10-I70.0). Electronically Signed   By: Van Clines M.D.   On: 02/20/2020 20:13   DG CHEST PORT 1 VIEW  Result Date: 02/20/2020 CLINICAL DATA:  Lethargy. Decreased blood pressure. Shortness of breath. EXAM: PORTABLE CHEST 1 VIEW COMPARISON:  02/12/2020 FINDINGS: Midline trachea. Cardiomegaly accentuated by AP portable technique. Small right pleural effusion. No pneumothorax. Moderate interstitial edema. Probable right hemidiaphragm elevation with adjacent right base airspace disease, similar. Mild left lower lobe airspace disease is not significantly changed. IMPRESSION: Moderate, increased congestive heart failure. Small right pleural effusion with right greater than left base airspace disease, similar. Most likely atelectasis. Pneumonia at the right lung base cannot be excluded. Electronically Signed   By: Abigail Miyamoto M.D.   On: 02/20/2020 16:26   IR THORACENTESIS ASP PLEURAL SPACE W/IMG GUIDE  Result Date: 02/21/2020 INDICATION: Shortness of breath with right pleural effusion. Request for therapeutic thoracentesis. EXAM: ULTRASOUND GUIDED RIGHT THORACENTESIS MEDICATIONS: 1% lidocaine 10 mL COMPLICATIONS: None immediate. PROCEDURE: An ultrasound guided thoracentesis was thoroughly discussed with the patient and questions answered. The benefits, risks, alternatives and complications were also discussed. The patient understands and wishes to proceed with the procedure. Written consent was obtained. Ultrasound was performed to localize and mark an adequate pocket of fluid in the right chest. The area was then prepped and draped in the normal sterile fashion. 1% Lidocaine was used for  local anesthesia. Under ultrasound guidance a 6 Fr Safe-T-Centesis catheter was introduced. Thoracentesis was performed. The catheter was removed and a dressing applied. FINDINGS: A total of approximately 450 mL of amber fluid was removed. IMPRESSION: Successful ultrasound guided right thoracentesis yielding 450 mL of pleural fluid. No pneumothorax on post-procedure chest x-ray. Read by: Gareth Eagle, PA-C Electronically Signed   By: Corrie Mckusick D.O.   On: 02/21/2020 16:13   Medications: . piperacillin-tazobactam (ZOSYN)  IV 2.25 g (02/22/20 0553)   . amiodarone  200 mg Oral Daily  . apixaban  2.5 mg Oral BID  . ascorbic acid  500 mg Oral BID  . benztropine  1 mg Oral BID  . Chlorhexidine Gluconate Cloth  6 each Topical Q0600  . colchicine  0.6 mg Oral Daily  . dextromethorphan-guaiFENesin  1 tablet Oral BID  . docusate  100 mg Oral TID  . feeding supplement (PRO-STAT SUGAR FREE 64)  30 mL Oral BID  . folic acid  1 mg Oral Daily  . free water  100 mL Per Tube BID  . gabapentin  100 mg Oral Q8H  . levothyroxine  25 mcg Oral Q0600  . mouth rinse  15 mL Mouth Rinse BID  . Melatonin  3 mg Oral QHS  . metoprolol tartrate  25 mg Oral TID WC  . midodrine  10 mg Oral TID WC  . multivitamin  1 tablet Oral QHS  . predniSONE  10 mg Oral Q breakfast  . senna  2 tablet Oral QHS  . thiamine  100 mg Oral Daily    Dialysis Orders: Prev orders - will need to change significantly on d/c MWF at [facility], 4:15hr, EDW 74.5kg, 2K/2.5Ca, AVF, heparin 5K - Calcitriol 0.34mcg PO q HD. No ESA or venofer.  Assessment/Plan: 1. .Deconditioning: S/p prolonged [facility] admit (12/2-12/23/19) s/p cardiac arrest, then [facility] admit until 2/21. Now CIR with worsening lethargy past several days, see below. 2. Lethargy/acute encephalopathy: Developed worsening lethargy and decreased PO intake on 3/7. K+ 7.3 on 3/8, underwent urgent HD. Somewhat improved after HD, still appearing fatigued today.  Possible component of CO2  retention- CT chest showing large effusion and diffuse edema, underwent thoracentesis on 3/9 and receiving serial HD for volume.  3. HTN/vol overload/ pulm edema:  CXR 2/24and 2/28withpersistent pulm edema. CT 3/8 showed large right and small left pleural effusions with edema. Thoracentesis on 3/9 with 450 cc fluid removed. Soft BP limiting UF with HD, net UF 1L yesterday. HD again today and tomorrow for volume. On scheduled midodrine TID. Pt sig pulm edema per CXR and CT chest, also effusion which was drained yest. Will need daily HD until better, will need to permanently lower BP thresholds on HD to get volume off.  Pt is borderline unstable due to these volume issues. Watch closely.  4.  ESRD:  Previously MWF schedule but changing to TTS at discharge. Last HD 3/8. Had hyperkalemia with K+ >7 on 3/8, K+ now 5.1. Planned for HD today and tomorrow.  5. Anemia: Hgb 13.2 , no ESA indicated.  6. Secondary hyperparathyroidism:Caat goal, phosvariable, 2.6 on 3/5 > 6.3. Trend phos.No binders/VDRA. 7. Hx cardiac arrest 8. Hx PE: On Eliquis 9. A-fib: On Eliquis + amiodarone 10. Hx pericardial effusion:colchicine +prednisone 10mg  QD. 11. HFrEF (<20%): volume management as above 12. 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 On gabapentin 100mg  TID which is an appropriate renal dose, caution using higher doses. 14. Constipation: large stool burdern on KUB 3/5. No abdominal pain at present. Recommendavoidingmagnesium containing products as able in ESRD patients.  Anice Paganini, PA-C 02/22/2020, 11:17 AM  [facility] Pager: [phone]  Pt seen, examined and agree w assess/plan as above with additions as indicated.  [facility] 02/22/2020, 11:40 AM

## 2020-02-23 ENCOUNTER — Inpatient Hospital Stay (HOSPITAL_COMMUNITY): Payer: Medicare Other | Admitting: Physical Therapy

## 2020-02-23 ENCOUNTER — Inpatient Hospital Stay (HOSPITAL_COMMUNITY): Payer: Medicare Other | Admitting: Occupational Therapy

## 2020-02-23 ENCOUNTER — Inpatient Hospital Stay (HOSPITAL_COMMUNITY): Payer: Medicare Other | Admitting: Speech Pathology

## 2020-02-23 MED ORDER — CHLORHEXIDINE GLUCONATE CLOTH 2 % EX PADS
6.0000 | MEDICATED_PAD | Freq: Every day | CUTANEOUS | Status: DC
  Administered 2020-02-25: 6 via TOPICAL

## 2020-02-23 NOTE — Progress Notes (Signed)
[facility] Progress Note   Subjective:   Patient seen in room. Tolerated 2.5L UF with HD yesterday. Much more alert today, eating ice. Discussed importance of fluid restrictions. Patient reports SOB is improving. Denies CP, dizziness, abdominal pain, N/V.  Objective Vitals:   02/22/20 1614 02/22/20 2247 02/23/20 0550 02/23/20 0557  BP: 100/64 94/75  (!) 139/109  Pulse: 75 67  71  Resp: 20 19  19   Temp: 98.5 F (36.9 C) 97.6 F (36.4 C)  (!) 97.4 F (36.3 C)  TempSrc: Oral   Oral  SpO2: 99% 98%  90%  Weight: 61.7 kg  64.7 kg   Height:       Physical Exam General: well developed, chronically ill appearing male, alert and in NAD Heart: RRR, no murmurs, rubs or gallops Lungs: Decreased breath sounds RLE, scattered crackles LLE, respirations unlabored Abdomen: Soft, non-tender, +BS, +PEG Extremities: 1+ edema bilateral lower extremities Dialysis Access:  RUE AVF +Bruit  Additional Objective Labs: Basic Metabolic Panel: Recent Labs  Lab 02/17/20 1606 02/20/20 1201 02/21/20 0532 02/21/20 1322 02/22/20 1319  NA 136   < > 134* 133* 133*  K 3.7   < > 4.8 5.1 4.1  CL 95*   < > 91* 91* 94*  CO2 25   < > 21* 18* 23  GLUCOSE 102*   < > 83 100* 126*  BUN 40*   < > 57* 64* 34*  CREATININE 3.05*   < > 5.00* 5.36* 4.13*  CALCIUM 8.6*   < > 8.8* 8.6* 8.5*  PHOS 2.6  --   --  6.3* 5.3*   < > = values in this interval not displayed.   Liver Function Tests: Recent Labs  Lab 02/20/20 1201 02/21/20 1322 02/22/20 1319  AST 61*  --   --   ALT 63*  --   --   ALKPHOS 148*  --   --   BILITOT 1.4*  --   --   PROT 7.3  --   --   ALBUMIN 3.7 3.6 3.0*   No results for input(s): LIPASE, AMYLASE in the last 168 hours. CBC: Recent Labs  Lab 02/17/20 1606 02/17/20 1606 02/20/20 1201 02/20/20 1201 02/21/20 0532 02/21/20 1322 02/22/20 1319  WBC 8.6   < > 15.8*   < > 15.2* 14.2* 16.0*  NEUTROABS  --   --  12.7*  --  13.2*  --   --   HGB 12.7*   < > 13.9   < >  12.7* 12.7* 12.2*  HCT 40.4   < > 46.1   < > 40.8 40.8 38.9*  MCV 87.3  --  91.7  --  88.5 88.7 89.2  PLT 254   < > 293   < > 219 218 167   < > = values in this interval not displayed.   Blood Culture    Component Value Date/Time   SDES BLOOD RIGHT HAND 12/17/2019 1520   SPECREQUEST  12/17/2019 1520    BOTTLES DRAWN AEROBIC ONLY Blood Culture results may not be optimal due to an inadequate volume of blood received in culture bottles   CULT  12/17/2019 1520    NO GROWTH 5 DAYS Performed at [facility], [place] [address], [place], [place] [address]    REPTSTATUS 12/22/2019 FINAL 12/17/2019 1520    Cardiac Enzymes: No results for input(s): CKTOTAL, CKMB, CKMBINDEX, TROPONINI in the last 168 hours. CBG: Recent Labs  Lab 02/20/20 1601 02/20/20 1830  GLUCAP 99 84   Iron Studies: No results for input(s): IRON, TIBC, TRANSFERRIN, FERRITIN in the last 72 hours. @lablastinr3 @ Studies/Results: DG Chest 1 View  Result Date: 02/21/2020 CLINICAL DATA:  Status post right thoracentesis, 450 cc of right side. EXAM: CHEST  1 VIEW COMPARISON:  Radiograph and CT yesterday. FINDINGS: Decreased right pleural effusion after thoracentesis. Improving aeration of the right lung base. Persistent right pleural fluid which tracks into the minor fissure. No visualized pneumothorax. Cardiomegaly and minimal left pleural effusion again seen. Interstitial thickening suspicious for pulmonary edema. No new airspace disease. IMPRESSION: 1. Decreased right pleural effusion after thoracentesis with improving aeration at the right lung base. No visualized pneumothorax. 2. Stable cardiomegaly, probable small left pleural effusion and interstitial thickening likely edema. Electronically Signed   By: Keith Rake M.D.   On: 02/21/2020 15:12   CT HEAD WO CONTRAST  Result Date: 02/22/2020 CLINICAL DATA:  Lethargy EXAM: CT HEAD WITHOUT CONTRAST TECHNIQUE: Contiguous axial images were obtained from the base of the  skull through the vertex without intravenous contrast. COMPARISON:  12/29/2019 FINDINGS: Brain: The brain shows a normal appearance without evidence of malformation, atrophy, old or acute small or large vessel infarction, mass lesion, hemorrhage, hydrocephalus or extra-axial collection. Vascular: There is atherosclerotic calcification of the major vessels at the base of the brain. Skull: Normal. No traumatic finding. No focal bone lesion. Sinuses/Orbits: Sinuses are clear. Orbits appear normal. Mastoids are clear. Other: Resolution of previously seen mastoid effusions. IMPRESSION: Normal head CT except for atherosclerotic calcification of the major vessels at the base of the brain. Resolution/improvement of previously seen mastoid effusions. Electronically Signed   By: Nelson Chimes M.D.   On: 02/22/2020 19:10   IR THORACENTESIS ASP PLEURAL SPACE W/IMG GUIDE  Result Date: 02/21/2020 INDICATION: Shortness of breath with right pleural effusion. Request for therapeutic thoracentesis. EXAM: ULTRASOUND GUIDED RIGHT THORACENTESIS MEDICATIONS: 1% lidocaine 10 mL COMPLICATIONS: None immediate. PROCEDURE: An ultrasound guided thoracentesis was thoroughly discussed with the patient and questions answered. The benefits, risks, alternatives and complications were also discussed. The patient understands and wishes to proceed with the procedure. Written consent was obtained. Ultrasound was performed to localize and mark an adequate pocket of fluid in the right chest. The area was then prepped and draped in the normal sterile fashion. 1% Lidocaine was used for local anesthesia. Under ultrasound guidance a 6 Fr Safe-T-Centesis catheter was introduced. Thoracentesis was performed. The catheter was removed and a dressing applied. FINDINGS: A total of approximately 450 mL of amber fluid was removed. IMPRESSION: Successful ultrasound guided right thoracentesis yielding 450 mL of pleural fluid. No pneumothorax on post-procedure chest  x-ray. Read by: Gareth Eagle, PA-C Electronically Signed   By: Corrie Mckusick D.O.   On: 02/21/2020 16:13   Medications: . piperacillin-tazobactam (ZOSYN)  IV 2.25 g (02/23/20 0603)   . amiodarone  200 mg Oral Daily  . apixaban  2.5 mg Oral BID  . ascorbic acid  500 mg Oral BID  . benztropine  1 mg Oral BID  . Chlorhexidine Gluconate Cloth  6 each Topical Q0600  . colchicine  0.6 mg Oral Daily  . dextromethorphan-guaiFENesin  1 tablet Oral BID  . docusate  100 mg Oral TID  . feeding supplement (PRO-STAT SUGAR FREE 64)  30 mL Oral BID  . folic acid  1 mg Oral Daily  . free water  100 mL Per Tube BID  . levothyroxine  25 mcg Oral Q0600  . mouth rinse  15 mL  Mouth Rinse BID  . Melatonin  3 mg Oral QHS  . metoprolol tartrate  25 mg Oral TID WC  . midodrine  10 mg Oral TID WC  . multivitamin  1 tablet Oral QHS  . predniSONE  10 mg Oral Q breakfast  . senna  2 tablet Oral QHS  . thiamine  100 mg Oral Daily    Dialysis Orders: Prev orders - will need to change significantly on d/c MWF at [facility], 4:15hr, EDW 74.5kg, 2K/2.5Ca, AVF, heparin 5K - Calcitriol 0.69mcg PO q HD. No ESA or venofer.   Assessment/Plan: 1..Deconditioning:S/p prolonged [facility] admit (12/2-12/23/19) s/p cardiac arrest, then [facility] admit until 2/21. Now CIR with worsening lethargy past several days, see below. 2. Lethargy/acute encephalopathy:Developed worsening lethargy and decreased PO intake on 3/7. K+ 7.3 on 3/8, underwent urgent HD. Somewhat improved after HD, still appearing fatigued today. Possible component of CO2 retention- CT chest showing large effusion and diffuse edema, underwent thoracentesis on 3/9 and receiving serial HD for volume.  3. HTN/vol overload/ pulm edema:CXR 2/24and 2/28withpersistent pulm edema. CT 3/8 showed large right and small left pleural effusions with edema. Thoracentesis on 3/9 with 450 cc fluid removed. Soft BP limiting UF with HD, has been getting serial HD for volume this week and  tolerated 2.5L UF yesterday, appears much improved today. Further UF with HD today, discussed fluid restrictions. 4.  ESRD:Previously MWF schedule but changing to TTS at discharge. Last HD 3/8. Had hyperkalemia with K+ >7 on 3/8, K+ now 5.1. Planned for daily HD until edema resolved.  5. Anemia:Hgb 12.2 , no ESA indicated.  6. Secondary hyperparathyroidism:Caat goal, phos5.3. Trend phos.No binders/VDRA. 7. Hx cardiac arrest 8. Hx PE: On Eliquis 9. A-fib: On Eliquis + amiodarone 10. Hx pericardial effusion:colchicine +prednisone 10mg  QD. 11. HFrEF (<20%): volume management as above 12. 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 On gabapentin 100mg  TID which is an appropriate renal dose, caution using higher doses. 14.Constipation:large stool burdern on KUB 3/5. No abdominal pain at present. Recommendavoidingmagnesium containing products as able in ESRD patients.  Anice Paganini, PA-C 02/23/2020, 11:17 AM  [facility] Pager: [phone]

## 2020-02-23 NOTE — Progress Notes (Signed)
Speech Language Pathology Weekly Progress and Session Note  Patient Details  Name: Mandela Bello MRN: [id number] Date of Birth: Aug 01, 1966  Beginning of progress report period: February 16, 2020 End of progress report period: February 23, 2020  Today's Date: 02/23/2020 SLP Individual Time: 0630-1601 SLP Individual Time Calculation (min): 25 min  Short Term Goals: Week 3: SLP Short Term Goal 1 (Week 3): STGs=LTGs due ELOS    New Short Term Goals: Week 4: SLP Short Term Goal 1 (Week 4): STGs=LTGs due ELOS  Weekly Progress Updates: Patient has made minimal gains and has had a cognitive decline secondary to multiple medical issues. Currently, patient is extremely lethargic and requires overall Max A multimodal cues for arousal. Due to patient's decreased arousal, Max A multimodal cues are needed to perform all functional tasks in regards to attention, initiation, recall and problem solving. Patient remains on regular textures with thin liquids without overt s/s of aspiration but with minimal PO intake. Patient demonstrates improved vocal intensity after the thoracentesis that was done on 3/9. Patient and family education ongoing. Patient would benefit from continued skilled SLP intervention to maximize his speech and cognitive functioning prior to discharge.      Intensity: Minumum of 1-2 x/day, 30 to 90 minutes Frequency: 3 to 5 out of 7 days Duration/Length of Stay: TBD due to medical issues Treatment/Interventions: Cognitive remediation/compensation;Speech/Language facilitation;Internal/external aids;Cueing hierarchy;Environmental controls;Therapeutic Activities;Patient/family education;Functional tasks   Daily Session  Skilled Therapeutic Interventions: Skilled treatment session focused on cognitive goals. Upon arrival, patient was awake while upright in bed. Patient requested to transfer to the recliner and required extra time and overall Min A verbal cues for safety with task. SLP also  provided Mod verbal cues for use of an increased vocal intensity at the phrase level to achieve ~75% intelligibility. Patient needed to blow his nose multiple times throughout the session and required constant cueing to donn his oxygen after completion of task due to poor working memory. Patient left upright in recliner with alarm on and all needs within reach. Continue with current plan of care.      Pain No/Denies Pain   Therapy/Group: Individual Therapy  Shaena Parkerson 02/23/2020, 6:26 AM

## 2020-02-23 NOTE — Progress Notes (Signed)
Occupational Therapy Weekly Progress Note  Patient Details  Name: [name] [name] MRN: [id number] Date of Birth: 01/07/1966  Beginning of progress report period: February 04, 2020 End of progress report period: February 23, 2020  Today's Date: 02/23/2020 OT Individual Time: 1105-1200 OT Individual Time Calculation (min): 55 min   Pt continues to make progress towards OT goals, but had a set-back this week 2/2 medical issues with new pneumonia and LOS needing to be extended 2/2 medical issues. Short term goals not set due to plan for dc this week. Pt has missed some therapies 2/2 lethargy and pain, but still requires min/supervision A for BADL tasks. Continue current POC.  Patient continues to demonstrate the following deficits: muscle weakness, decreased cardiorespiratoy endurance and decreased oxygen support, decreased awareness, decreased problem solving, decreased safety awareness, decreased memory and delayed processing and decreased sitting balance, decreased standing balance, decreased postural control, hemiplegia and decreased balance strategies and therefore will continue to benefit from skilled OT intervention to enhance overall performance with BADL and Reduce care partner burden.  See Patient's Care Plan for progression toward long term goals.  Patient progressing toward long term goals..  Continue plan of care.  Skilled Therapeutic Interventions/Progress Updates:    Pt greeted sitting in recliner asleep, but easy to wake and agreeable to OT treatment session. Pt agreeable to wash up and change clothes. Pt completed stand-pivot to the recliner with increased time and CGA. Pt brought to the sink and worked on sit<>stands and standing balance/endurance within BADLs. Sit<>stands with min A, progressing to CGA with practice. Pt tolerated standing for 3 minutes while washing lower body. CGA for balance. UB dressing completed from sitting in wc. Pt needed increased time and effort for all BADL  tasks. Pt also on 2L of O2 throughout session. OT also worked on pt managing his own nasal canula during ADLs as pt will be going home on oxygen. Pt pivoted back to recliner at end of session with alarm belt on, call bell in reach, and needs met.  Therapy Documentation Precautions:  Precautions Precautions: Fall Precaution Comments: Peg, Darco shoes Restrictions Weight Bearing Restrictions: No Pain:     Pt reports 9/10 pain to B LEs. OT repositioned for comfort.   Therapy/Group: Individual Therapy   S  02/23/2020, 12:23 PM  

## 2020-02-23 NOTE — Progress Notes (Signed)
Physical Therapy Session Note  Patient Details  Name: Travarus Trudo MRN: [id number] Date of Birth: 01/22/1966  Today's Date: 02/23/2020 PT Individual Time: 0800-0900 PT Individual Time Calculation (min): 60 min   Short Term Goals: Week 4:  PT Short Term Goal 1 (Week 4): STG = LTG due to estimated d/c date.  Skilled Therapeutic Interventions/Progress Updates:    Pt received seated in bed receiving AM meds from RN. Pt agreeable to therapy session. Pt reports 8/10 pain in B feet, received gabapentin prior to start of therapy session. MD in room to assess pt during session and discussed pain management with patient. Pt is mod A for sitting in bed to sitting EOB for trunk control and some LE management. Pt is dependent to don Darco shoes while seated EOB. Pt on 2L O2 at rest, SpO2 99%. Sit to stand with min A to RW from elevated bed. Ambulation x 100 ft with RW at Supervision level. Pt with decreased gait speed due to pain in B feet. Pt returned to sitting EOB. SpO2 following ambulation 86% while on 2L O2, returns to 90% (+) with seated rest break and pursed lip breathing techniques. Seated BLE strengthening therex x 10 reps: marches, LAQ. Pt is then able to take a few steps towards [facility] with RW and Supervision. Sit to supine with mod A for BLE management. Pt left seated in bed with HOB elevated, needs in reach, bed alarm in place at end of session.  Therapy Documentation Precautions:  Precautions Precautions: Fall Precaution Comments: Peg, Darco shoes Restrictions Weight Bearing Restrictions: No    Therapy/Group: Individual Therapy   Excell Seltzer, PT, DPT  02/23/2020, 12:37 PM

## 2020-02-23 NOTE — Progress Notes (Signed)
Social Work Patient ID: [name] [name], male   DOB: Apr 24, 1966, 54 y.o.   MRN: [id number]   SW received updates from [name][facility] who reported that after continued review of patient care needs, the branch has declined to accept pt for [facility] services. A referral will be sent to [facility] who are willing to accept pt for care needs at discharge.   *SW spoke with [facility] (p: [phone]/f:[phone]) stating they are able to accept pt for HHPT/OT/ST/CNA/SN. SW faxed orders. SW indicated will provide updates on if pt d/c on 3/15.  Loralee Pacas, MSW, [place] Office: [phone] Cell: [phone] Fax: [phone]

## 2020-02-23 NOTE — Progress Notes (Signed)
Nutrition Follow-up  DOCUMENTATION CODES:   Not applicable  INTERVENTION:   Continue snacks TID  Continue MVI daily    NUTRITION DIAGNOSIS:   Increased nutrient needs related to acute illness, chronic illness as evidenced by estimated needs.  Ongoing.  GOAL:   Patient will meet greater than or equal to 90% of their needs  Progressing.   MONITOR:   PO intake, Supplement acceptance, Labs, Weight trends, TF tolerance  REASON FOR ASSESSMENT:   Consult Enteral/tube feeding initiation and management, Assessment of nutrition requirement/status  ASSESSMENT:   54 yo male admitted to CIR from [facility] after prolonged hospitalization post cardiac arrest on 11/16/2019 requiring trach and PEG. PMH includes ESRD on HD, CHF, HTN, cocaine abuse, medical non-adherence.  12/2 Admit to [name] post [facility]iac Arrest 1/08 Discharged and Admitted with [facility] 1/25 G tube placed 2/17 De-Cannulated  2/19 Admit to CIR, Renal diet initiated 2/20 Diet downgraded to Dysphagia 3, Nectar 2/26 Renal diet ordered with thin liquids 3/5 s/p KUB, large stool burden  3/7 K+ 7.3 requiring urgent dialysis 3/9 thoracentesis, 450cc removed  Discussed pt with RN.  Pt unavailable at time of RD visit. Pt noted to have developed worsening lethargy and decreased PO intake on 3/7 with K+ 7.3, required urgent dialysis. Pt continued to have decreased PO intake after HD due to lethargy. PO intake is 100% x last 2 recorded meals, appears to be improving.   Pt fluid overloaded requiring strict I/Os and daily HD until edema is resolved.   Pt is below EDW. Of note, pt has been below EDW since admission.   EDW: 74.5 kg (per Nephrology) Current weight: 64.9 kg Admit weight: 62.6 kg  Last HD: 3/10 Net UF: 2533mL  PO intake: 0-100% x last 7 recorded meals (42% average intake)  Medications reviewed and include: Vitamin C, colace, 44ml Pro-stat BID, Folvite, 132ml free water BID, Rena-vit, Deltasone,  Senokot, Thiamine, IV Abx  Labs reviewed: Sodium 133,  Phosphorus 5.3   Diet Order:   Diet Order            Diet renal with fluid restriction Fluid restriction: 1200 mL Fluid; Room service appropriate? Yes; Fluid consistency: Thin  Diet effective now              EDUCATION NEEDS:   Not appropriate for education at this time  Skin:  Skin Assessment: Skin Integrity Issues: Skin Integrity Issues:: Other (Comment) Other: bilateral feet with dry gangrene; open area to bottom of 2nd & 3rd toe on left foot, 2nd & 5th toe on right foot  Last BM:  02/22/20  Height:   Ht Readings from Last 1 Encounters:  02/06/20 5' 10.5" (1.791 m)    Weight:   Wt Readings from Last 1 Encounters:  02/23/20 64.9 kg    BMI:  Body mass index is 20.24 kg/m.  Estimated Nutritional Needs:   Kcal:  2200-2400 kcals  Protein:  110-120 g  Fluid:  1000 mL plus UOP   Larkin Ina, MS, RD, LDN RD pager number and weekend/on-call pager number located in [place].

## 2020-02-23 NOTE — Progress Notes (Signed)
[facility] PHYSICAL MEDICINE & REHABILITATION PROGRESS NOTE  Subjective/Complaints: [name] [name] is looking much better this morning but states that he is having pain in both feet and cannot participate in therapy.  Breathing appears improved. Discussed yesterday's CT Head and laboratory results with him.     ROS: Pt denies SOB, CP, N/V/C/D however nursing said LBM Saturday.   Objective: Vital Signs: Blood pressure (!) 139/109, pulse 71, temperature (!) 97.4 F (36.3 C), temperature source Oral, resp. rate 19, height 5' 10.5" (1.791 m), weight 64.7 kg, SpO2 90 %. DG Chest 1 View  Result Date: 02/21/2020 CLINICAL DATA:  Status post right thoracentesis, 450 cc of right side. EXAM: CHEST  1 VIEW COMPARISON:  Radiograph and CT yesterday. FINDINGS: Decreased right pleural effusion after thoracentesis. Improving aeration of the right lung base. Persistent right pleural fluid which tracks into the minor fissure. No visualized pneumothorax. Cardiomegaly and minimal left pleural effusion again seen. Interstitial thickening suspicious for pulmonary edema. No new airspace disease. IMPRESSION: 1. Decreased right pleural effusion after thoracentesis with improving aeration at the right lung base. No visualized pneumothorax. 2. Stable cardiomegaly, probable small left pleural effusion and interstitial thickening likely edema. Electronically Signed   By: Keith Rake M.D.   On: 02/21/2020 15:12   CT HEAD WO CONTRAST  Result Date: 02/22/2020 CLINICAL DATA:  Lethargy EXAM: CT HEAD WITHOUT CONTRAST TECHNIQUE: Contiguous axial images were obtained from the base of the skull through the vertex without intravenous contrast. COMPARISON:  12/29/2019 FINDINGS: Brain: The brain shows a normal appearance without evidence of malformation, atrophy, old or acute small or large vessel infarction, mass lesion, hemorrhage, hydrocephalus or extra-axial collection. Vascular: There is atherosclerotic calcification of the  major vessels at the base of the brain. Skull: Normal. No traumatic finding. No focal bone lesion. Sinuses/Orbits: Sinuses are clear. Orbits appear normal. Mastoids are clear. Other: Resolution of previously seen mastoid effusions. IMPRESSION: Normal head CT except for atherosclerotic calcification of the major vessels at the base of the brain. Resolution/improvement of previously seen mastoid effusions. Electronically Signed   By: Nelson Chimes M.D.   On: 02/22/2020 19:10   IR THORACENTESIS ASP PLEURAL SPACE W/IMG GUIDE  Result Date: 02/21/2020 INDICATION: Shortness of breath with right pleural effusion. Request for therapeutic thoracentesis. EXAM: ULTRASOUND GUIDED RIGHT THORACENTESIS MEDICATIONS: 1% lidocaine 10 mL COMPLICATIONS: None immediate. PROCEDURE: An ultrasound guided thoracentesis was thoroughly discussed with the patient and questions answered. The benefits, risks, alternatives and complications were also discussed. The patient understands and wishes to proceed with the procedure. Written consent was obtained. Ultrasound was performed to localize and mark an adequate pocket of fluid in the right chest. The area was then prepped and draped in the normal sterile fashion. 1% Lidocaine was used for local anesthesia. Under ultrasound guidance a 6 Fr Safe-T-Centesis catheter was introduced. Thoracentesis was performed. The catheter was removed and a dressing applied. FINDINGS: A total of approximately 450 mL of amber fluid was removed. IMPRESSION: Successful ultrasound guided right thoracentesis yielding 450 mL of pleural fluid. No pneumothorax on post-procedure chest x-ray. Read by: Gareth Eagle, PA-C Electronically Signed   By: Corrie Mckusick D.O.   On: 02/21/2020 16:13   Recent Labs    02/21/20 1322 02/22/20 1319  WBC 14.2* 16.0*  HGB 12.7* 12.2*  HCT 40.8 38.9*  PLT 218 167   Recent Labs    02/21/20 1322 02/22/20 1319  NA 133* 133*  K 5.1 4.1  CL 91* 94*  CO2 18* 23  GLUCOSE 100* 126*   BUN 64* 34*  CREATININE 5.36* 4.13*  CALCIUM 8.6* 8.5*    Physical Exam: BP (!) 139/109 (BP Location: Right Arm)   Pulse 71   Temp (!) 97.4 F (36.3 C) (Oral)   Resp 19   Ht 5' 10.5" (1.791 m)   Wt 64.7 kg   SpO2 90%   BMI 20.19 kg/m  Constitutional: Sitting up at EOB with improved attention. HEENT: EOMI, oral membranes moist Neck: supple Cardiovascular: RRR Respiratory/Chest: CTA B/L- no accessory muscle use. Breathing comfortably.  GI/Abdomen: hypoactive BS; moderately distended; NT;  Dark brownish/reddish bloody drainage- like from hematoma- draining around PEG- not in PEG tube Skin:   B/L toes gangrenous Psych: very flat; sleepy initially Musc: No edema in extremities.  No tenderness in extremities. Neurological: alert, dysarthric speech. Processing better. Better awareness Motor: 4- to /5 in all 4's with pain inhibition at the feet  Assessment/Plan: 1. Functional deficits secondary to encephalopathy which require 3+ hours per day of interdisciplinary therapy in a comprehensive inpatient rehab setting.  Physiatrist is providing close team supervision and 24 hour management of active medical problems listed below.  Physiatrist and rehab team continue to assess barriers to discharge/monitor patient progress toward functional and medical goals  Care Tool:  Bathing  Bathing activity did not occur: Refused Body parts bathed by patient: Buttocks, Front perineal area   Body parts bathed by helper: Abdomen, Right lower leg, Left lower leg     Bathing assist Assist Level: Supervision/Verbal cueing     Upper Body Dressing/Undressing Upper body dressing Upper body dressing/undressing activity did not occur (including orthotics): N/A What is the patient wearing?: Pull over shirt    Upper body assist Assist Level: Moderate Assistance - Patient 50 - 74%    Lower Body Dressing/Undressing Lower body dressing    Lower body dressing activity did not occur: N/A What is  the patient wearing?: Underwear/pull up, Pants     Lower body assist Assist for lower body dressing: Moderate Assistance - Patient 50 - 74%     Toileting Toileting    Toileting assist Assist for toileting: Minimal Assistance - Patient > 75%     Transfers Chair/bed transfer  Transfers assist     Chair/bed transfer assist level: Supervision/Verbal cueing     Locomotion Ambulation   Ambulation assist   Ambulation activity did not occur: N/A  Assist level: Supervision/Verbal cueing Assistive device: Walker-rolling Max distance: 93ft   Walk 10 feet activity   Assist     Assist level: Supervision/Verbal cueing Assistive device: Walker-rolling   Walk 50 feet activity   Assist    Assist level: Supervision/Verbal cueing Assistive device: Walker-rolling    Walk 150 feet activity   Assist Walk 150 feet activity did not occur: Safety/medical concerns  Assist level: Supervision/Verbal cueing Assistive device: Walker-rolling    Walk 10 feet on uneven surface  activity   Assist Walk 10 feet on uneven surfaces activity did not occur: Safety/medical concerns         Wheelchair     Assist Will patient use wheelchair at discharge?: Yes Type of Wheelchair: Manual Wheelchair activity did not occur: N/A  Wheelchair assist level: Minimal Assistance - Patient > 75% Max wheelchair distance: 150 ft    Wheelchair 50 feet with 2 turns activity    Assist        Assist Level: Minimal Assistance - Patient > 75%   Wheelchair 150 feet activity     Assist  Assist Level: Minimal Assistance - Patient > 75%      Medical Problem List and Plan: 1.  Deficits with mobility, endurance, self-care secondary to encephalopathy with debility, PVD with gangrenous toes.  -Continue CIR therapies including PT, OT, and SLP   -pt with baseline cognitive deficits  -ELOS 3/9 2.  Antithrombotics: -PE/anticoagulation:  Pharmaceutical: Other (comment)--on low  dose Eliquis             -antiplatelet therapy:  N/A 3. Pain Management: Oxycodone or tramadol prn.   2/24: Therapist notes that [name] [name] has been having hypersensitivity of his gangrenous toes. Will add low dose Gabapentin 100mg  TID to help with pain-- low dose due to ESRD.  2/28 continue gabapentin---seems to be tolerating so far  3/1: Pain is well controlled.   3/3: gabapentin increased slightly for foot/toe pain.  3/5  Foot/toe pain seems better controlled   3/7: well controlled.   3/10: complaining of pain this morning. Gabapentin has been discontinued due to sedative potential given patient's renal function. Can encourage Tramadol use; received this morning.  4. Mood: LCSW to follow for evaluation and support.    See #14             -antipsychotic agents: N/A   See #14 5. Neuropsych: This patient is not fully capable of making decisions on his own behalf. 6. Skin/Wound Care: Dry dressing to bilateral feet.              trach stoma closed. PEG site intact 7. Fluids/Electrolytes/Nutrition:   2/23 stopped TF  -monitor intake.   8. A fib/NSVT: On amiodarone and metoprolol for rate control.              Rate in 90's on 2/25   -isolated bradycardia yesterday   3/2--3/3: rates remain in 90's   -see #9.   3/6: rate in 80s  3/11: rate in 60s-70s.   Monitor with increased activity.  -intermittent SOB, CXR demonstrates CHF. Nephrology working on volume mgt with HD 9.  ESRD: HD MWF at the end of the day to help with activity tolerance.   -volume/weight mgt per nephrology,    3/3 weights stable  3/6: weight decreased  3/9: HD again today at noon  3/11: Labs reviewed. Cr and K+ improved with dialysis.  Filed Weights   02/22/20 1301 02/22/20 1614 02/23/20 0550  Weight: 64.4 kg 61.7 kg 64.7 kg  10. Pericardial effusion: Has been on colchicine since 1/19 and has been weaned to prednisone 10 mg daily. Last echo 1/29 shows decrease to small effusion.  3/7: Yesterday evening he was  complaining of increased shortness of breath and his breathing appeared more labored on examination yesterday. HD has not been able to remove as much fluid due to his hypotension. Gave 20mg  Lasix and his breathing is much improved today, appears comfortably. EKG obtained to assess qTC given Lasix administration, qTC is 432, much improved from prior.  3/8- Weight up 10 lbs in 1 day- will follow trend, since 10 lbs change unlikely.   3/9: Discussed with hospitalist Dr. Jerilee Hoh, she will schedule for thoracentesis with IR today.   3/10: Yesterday patient underwent successful US guided righ thoracentesis which yielded 462mL of amber fluid. Post-procedure XR reveals no pneumothorax.  11. Hypotension: Continue midodrine 10 mg tid.  BP's still soft 2/26, 3/1, 3/3. A little better 3/4  3/6: continue to be very soft.   3/8- BP 125/113- 120/94- BP doing better -con't regimen.  3/11: well controlled.  12. Dysphagia/PEG:   Eating fairly well now  2/26- had MBS ---upgraded to regular/thins  3/5: abdomen distended today with mild discomfort. has had two medium bm's over last 2 days.    -check kub given distention   -advised patient that he may have some discharge from around PEG. Change dressings as needed  3/6: likely having loose stools around constipated mass; KUB shows retained stool in bowel and questionable undigested food in stomach. Refused enema. Provided education about why he needs it but continues to refuse. Will order mag citrate to which he is agreeable.   3/7: refused mag citrate and enema yesterday. He is willing to take Senna HS and Docusate TID.   3/8- pt having dark brown/red bloody drainage around PEG_ like from hematoma- will have IR take a look at it today. LBM Saturday. Still refusing bowel meds. Not eating well, likely due to distension- refuses bowel meds, so have no way to fix distension.  3/10: Finally amenable to enema 3/9 and had medium sized BM yesterday.  13. Dry gangrene  bilateral feet: Needs to wear shoes for protection. Dry dressing. Maintain adequate nutritional status.    -he is having ongoing pain in feet, right more than left.   -both feet cool, especially right  -wounds appear stable to improved but demonstrate ongoing gangrene as well  3/2-4 -vascular following patient. May need toe amps eventually  -continue local care, pain mgt  3/6: Patient continues to pick at wounds despite education not to.  14. Anxiety/depression: On Zoloft 25 mg/day.  Team support.  ECG with prolonged QTC 512  Continue melatonin  Klonopin increased  1 mg 3 times daily 15. Insomnia: Per father, he sleeps late at night at baseline.   -2/27 still having difficulties more often than not at night d/t confusion/agitation/anxiety   -resumed seroquel 25mg  qhs   -QTC actually was DECREASED last week after giving seroquel the other day.   3/2 sleep generally improved   -has baseline insomnia   -pain and anxiety often inhibit sleep   -QTC further decreased on  EKG   16. Phlegm with slight cough: 3/7 started Mucinex. 17. Encephalopathy: CT Head obtained yesterday, personally reviewed and shows improvement from prior. Mental status improved today.  18. Disposition: Unstable for discharge today. Plan for DC on Monday. Father was updated via phone.    LOS: 20 days A FACE TO FACE EVALUATION WAS PERFORMED  [name] [name] 02/23/2020, 9:25 AM

## 2020-02-23 NOTE — Progress Notes (Signed)
Pharmacy Antibiotic Note  [name] [name] is a 54 y.o. male admitted on 02/03/2020 with pneumonia.  Pharmacy has been consulted for zosyn dosing in HD patient.   Today is D4 of Zosyn for empiric PNA coverage. The patient is afebrile, WBC up to 16 (chronic steroids). ESRD - dosing remains appropriate.  Plan: - Continue Zosyn 2.25g IV every 8 hours - Consider addressing LOT - consider stop date at 5-7d? - Will continue to follow HD schedule/duration, culture results, LOT, and antibiotic de-escalation plans   Height: 5' 10.5" (179.1 cm) Weight: 142 lb 11.2 oz (64.7 kg) IBW/kg (Calculated) : 74.15  Temp (24hrs), Avg:98.2 F (36.8 C), Min:97.4 F (36.3 C), Max:99.1 F (37.3 C)  Recent Labs  Lab 02/17/20 1606 02/20/20 1201 02/21/20 0532 02/21/20 1322 02/22/20 1319  WBC 8.6 15.8* 15.2* 14.2* 16.0*  CREATININE 3.05* 7.95* 5.00* 5.36* 4.13*    Estimated Creatinine Clearance: 19.4 mL/min (A) (by C-G formula based on SCr of 4.13 mg/dL (H)).    Allergies  Allergen Reactions  . Losartan Cough    Thank you for allowing pharmacy to be a part of this patient's care.  Alycia Rossetti, PharmD, BCPS Clinical Pharmacist Clinical phone for 02/23/2020: [phone] 02/23/2020 10:20 AM   **Pharmacist phone directory can now be found on [email or web address] ([email or web address]).  Listed under [facility].

## 2020-02-24 ENCOUNTER — Inpatient Hospital Stay (HOSPITAL_COMMUNITY): Payer: Medicare Other

## 2020-02-24 ENCOUNTER — Inpatient Hospital Stay (HOSPITAL_COMMUNITY): Payer: Medicare Other | Admitting: Occupational Therapy

## 2020-02-24 ENCOUNTER — Inpatient Hospital Stay (HOSPITAL_COMMUNITY): Payer: Medicare Other | Admitting: Speech Pathology

## 2020-02-24 ENCOUNTER — Inpatient Hospital Stay (HOSPITAL_COMMUNITY): Payer: Medicare Other | Admitting: Physical Therapy

## 2020-02-24 LAB — RENAL FUNCTION PANEL
Albumin: 2.9 g/dL — ABNORMAL LOW (ref 3.5–5.0)
Anion gap: 18 — ABNORMAL HIGH (ref 5–15)
BUN: 33 mg/dL — ABNORMAL HIGH (ref 6–20)
CO2: 23 mmol/L (ref 22–32)
Calcium: 8.6 mg/dL — ABNORMAL LOW (ref 8.9–10.3)
Chloride: 95 mmol/L — ABNORMAL LOW (ref 98–111)
Creatinine, Ser: 3.97 mg/dL — ABNORMAL HIGH (ref 0.61–1.24)
GFR calc Af Amer: 19 mL/min — ABNORMAL LOW (ref >60)
GFR calc non Af Amer: 16 mL/min — ABNORMAL LOW (ref >60)
Glucose, Bld: 141 mg/dL — ABNORMAL HIGH (ref 70–99)
Phosphorus: 3.7 mg/dL (ref 2.5–4.6)
Potassium: 2.8 mmol/L — ABNORMAL LOW (ref 3.5–5.1)
Sodium: 136 mmol/L (ref 135–145)

## 2020-02-24 LAB — CBC
HCT: 42.9 % (ref 39.0–52.0)
Hemoglobin: 12.8 g/dL — ABNORMAL LOW (ref 13.0–17.0)
MCH: 27.5 pg (ref 26.0–34.0)
MCHC: 29.8 g/dL — ABNORMAL LOW (ref 30.0–36.0)
MCV: 92.1 fL (ref 80.0–100.0)
Platelets: 194 10*3/uL (ref 150–400)
RBC: 4.66 MIL/uL (ref 4.22–5.81)
RDW: 23.5 % — ABNORMAL HIGH (ref 11.5–15.5)
WBC: 11.2 10*3/uL — ABNORMAL HIGH (ref 4.0–10.5)
nRBC: 1.3 % — ABNORMAL HIGH (ref 0.0–0.2)

## 2020-02-24 NOTE — Progress Notes (Signed)
Speech Language Pathology Daily Session Note  Patient Details  Name: Zacchaeus Halm MRN: [id number] Date of Birth: 1966-08-30  Today's Date: 02/24/2020 SLP Individual Time: 0725-0755 SLP Individual Time Calculation (min): 30 min  Short Term Goals: Week 3: SLP Short Term Goal 1 (Week 3): STGs=LTGs due ELOS  Skilled Therapeutic Interventions: Skilled treatment session focused on cognitive goals. Upon arrival, patient was awake and alert while sitting EOB. Darco shoes were donned and patient transferred to the recliner per his request with supervision. SLP re-administered a cognitive assessment (MoCA-BASIC) to assess current cognitive functioning. Patient scored 27/30 points with a score of 26 or above considered normal. Patient demonstrated mild deficits in short-term recall, however, patient with memory deficits at baseline. Patient left upright in recliner with alarm on and all needs within reach. Continue with current plan of care.      Pain No/Denies Pain   Therapy/Group: Individual Therapy  Florella Mcneese 02/24/2020, 11:05 AM

## 2020-02-24 NOTE — Progress Notes (Signed)
Physical Therapy Session Note  Patient Details  Name: [name] [name] MRN: [id number] Date of Birth: 1966-10-01  Today's Date: 02/24/2020 PT Individual Time: 3244-0102 PT Individual Time Calculation (min): 54 min   Short Term Goals: Week 3:  PT Short Term Goal 1 (Week 3): STG = LTG due to estimated d/c date.  Skilled Therapeutic Interventions/Progress Updates:  Pt received in recliner, awake & alert, with father present. Educated pt's father [name]) on w/c leg rest management & how to fold up w/c, as well as how to fold up RW. Pt dons B darco shoes with only min assist to manage velcro on R shoe - therapist educates pt & [name] on importance of pt wearing darco shoes at home for B foot protection. Pt transfers sit>stand with supervision from recliner and ambulates room>ortho gym with RW & close supervision with significant forward lean & heavy BUE reliance on RW despite ongoing education/cuing to ambulate within base of AD with upright posture, forward vs downward gaze & to relax B shoulders. Pt completes car transfer with assistance from father with cuing for hand placement and eccentric control with stand>sit and assistance from [place] for sit>stand from low sedan simulated seat height. [name] reports he will bring sedan to pick pt up in & does not voice any other questions/concerns re: pt d/c home. [name] exits session. Pt transfers w/c<>nustep with RW & min assist for sit>stand, CGA stand pivot. Pt utilized nu-step on level 1 x 10 minutes with all four extremities with cuing but poor demo of increased speed with focus on cardiopulmonary endurance training & global strengthening. Pt returned to room and completes w/c>recliner with RW & CGA with extra time. At end of session pt left in recliner with chair alarm donned, all needs in reach.  Pt on 2L/min supplemental oxygen via nasal cannula during session, SpO2 >90% throughout. Educated pt on need to use supplemental oxygen at all times.  Therapy  Documentation Precautions:  Precautions Precautions: Fall Precaution Comments: Peg, Darco shoes Restrictions Weight Bearing Restrictions: No  Pain: Pt reports unrated pain in B feet - rest breaks provided PRN & pain meds requested from nursing staff.   Therapy/Group: Individual Therapy  [name] 02/24/2020, 12:22 PM

## 2020-02-24 NOTE — Progress Notes (Signed)
[facility] PHYSICAL MEDICINE & REHABILITATION PROGRESS NOTE  Subjective/Complaints: Mr. Seitzinger is looking much better this morning.  He is having some constipation and is willing to get an enema.  He had a nice visit with his dad yesterday.  Feels that his breathing is better this morning.    ROS: Pt denies SOB, CP, N/V/C/D however nursing said LBM Saturday.   Objective: Vital Signs: Blood pressure 105/85, pulse 73, temperature 98.2 F (36.8 C), temperature source Oral, resp. rate 18, height 5' 10.5" (1.791 m), weight 62.6 kg, SpO2 95 %. CT HEAD WO CONTRAST  Result Date: 02/22/2020 CLINICAL DATA:  Lethargy EXAM: CT HEAD WITHOUT CONTRAST TECHNIQUE: Contiguous axial images were obtained from the base of the skull through the vertex without intravenous contrast. COMPARISON:  12/29/2019 FINDINGS: Brain: The brain shows a normal appearance without evidence of malformation, atrophy, old or acute small or large vessel infarction, mass lesion, hemorrhage, hydrocephalus or extra-axial collection. Vascular: There is atherosclerotic calcification of the major vessels at the base of the brain. Skull: Normal. No traumatic finding. No focal bone lesion. Sinuses/Orbits: Sinuses are clear. Orbits appear normal. Mastoids are clear. Other: Resolution of previously seen mastoid effusions. IMPRESSION: Normal head CT except for atherosclerotic calcification of the major vessels at the base of the brain. Resolution/improvement of previously seen mastoid effusions. Electronically Signed   By: Nelson Chimes M.D.   On: 02/22/2020 19:10   Recent Labs    02/21/20 1322 02/22/20 1319  WBC 14.2* 16.0*  HGB 12.7* 12.2*  HCT 40.8 38.9*  PLT 218 167   Recent Labs    02/21/20 1322 02/22/20 1319  NA 133* 133*  K 5.1 4.1  CL 91* 94*  CO2 18* 23  GLUCOSE 100* 126*  BUN 64* 34*  CREATININE 5.36* 4.13*  CALCIUM 8.6* 8.5*    Physical Exam: BP 105/85 (BP Location: Right Arm)   Pulse 73   Temp 98.2 F (36.8  C) (Oral)   Resp 18   Ht 5' 10.5" (1.791 m)   Wt 62.6 kg   SpO2 95%   BMI 19.52 kg/m  Constitutional: Sitting up at EOB with improved attention. Seated comfortably in chair.  HEENT: EOMI, oral membranes moist Neck: supple Cardiovascular: RRR Respiratory/Chest: CTA B/L- no accessory muscle use. Breathing comfortably.  GI/Abdomen: hypoactive BS; moderately distended; NT;  Dark brownish/reddish bloody drainage- like from hematoma- draining around PEG- not in PEG tube Skin:   B/L toes gangrenous Psych: More awake today  Musc: No edema in extremities.  No tenderness in extremities. Neurological: alert, dysarthric speech. Processing better. Better awareness Motor: 4- to /5 in all 4's with pain inhibition at the feet  Assessment/Plan: 1. Functional deficits secondary to encephalopathy which require 3+ hours per day of interdisciplinary therapy in a comprehensive inpatient rehab setting.  Physiatrist is providing close team supervision and 24 hour management of active medical problems listed below.  Physiatrist and rehab team continue to assess barriers to discharge/monitor patient progress toward functional and medical goals  Care Tool:  Bathing  Bathing activity did not occur: Refused Body parts bathed by patient: Right arm, Left arm, Chest, Abdomen, Front perineal area, Buttocks, Right upper leg, Left upper leg, Right lower leg, Left lower leg, Face   Body parts bathed by helper: Abdomen, Right lower leg, Left lower leg     Bathing assist Assist Level: Contact Guard/Touching assist     Upper Body Dressing/Undressing Upper body dressing Upper body dressing/undressing activity did not occur (including orthotics): N/A What  is the patient wearing?: Pull over shirt    Upper body assist Assist Level: Supervision/Verbal cueing    Lower Body Dressing/Undressing Lower body dressing    Lower body dressing activity did not occur: N/A What is the patient wearing?: Pants     Lower  body assist Assist for lower body dressing: Contact Guard/Touching assist     Toileting Toileting    Toileting assist Assist for toileting: Minimal Assistance - Patient > 75%     Transfers Chair/bed transfer  Transfers assist     Chair/bed transfer assist level: Contact Guard/Touching assist     Locomotion Ambulation   Ambulation assist   Ambulation activity did not occur: N/A  Assist level: Supervision/Verbal cueing Assistive device: Walker-rolling Max distance: 100'   Walk 10 feet activity   Assist     Assist level: Supervision/Verbal cueing Assistive device: Walker-rolling   Walk 50 feet activity   Assist    Assist level: Supervision/Verbal cueing Assistive device: Walker-rolling    Walk 150 feet activity   Assist Walk 150 feet activity did not occur: Safety/medical concerns  Assist level: Supervision/Verbal cueing Assistive device: Walker-rolling    Walk 10 feet on uneven surface  activity   Assist Walk 10 feet on uneven surfaces activity did not occur: Safety/medical concerns         Wheelchair     Assist Will patient use wheelchair at discharge?: Yes Type of Wheelchair: Manual Wheelchair activity did not occur: N/A  Wheelchair assist level: Minimal Assistance - Patient > 75% Max wheelchair distance: 150 ft    Wheelchair 50 feet with 2 turns activity    Assist        Assist Level: Minimal Assistance - Patient > 75%   Wheelchair 150 feet activity     Assist     Assist Level: Minimal Assistance - Patient > 75%      Medical Problem List and Plan: 1.  Deficits with mobility, endurance, self-care secondary to encephalopathy with debility, PVD with gangrenous toes.  -Continue CIR therapies including PT, OT, and SLP   -pt with baseline cognitive deficits  -ELOS 3/9 2.  Antithrombotics: -PE/anticoagulation:  Pharmaceutical: Other (comment)--on low dose Eliquis             -antiplatelet therapy:  N/A 3. Pain  Management: Oxycodone or tramadol prn.   2/24: Therapist notes that Mr. Breon has been having hypersensitivity of his gangrenous toes. Will add low dose Gabapentin 100mg  TID to help with pain-- low dose due to ESRD.  2/28 continue gabapentin---seems to be tolerating so far  3/1: Pain is well controlled.   3/3: gabapentin increased slightly for foot/toe pain.  3/5  Foot/toe pain seems better controlled   3/7: well controlled.   3/10: complaining of pain this morning. Gabapentin has been discontinued due to sedative potential given patient's renal function. Can encourage Tramadol use; received this morning.   3/11: Pain appears better controlled this morning.  4. Mood: LCSW to follow for evaluation and support.    See #14             -antipsychotic agents: N/A   See #14 5. Neuropsych: This patient is not fully capable of making decisions on his own behalf. 6. Skin/Wound Care: Dry dressing to bilateral feet.              trach stoma closed. PEG site intact 7. Fluids/Electrolytes/Nutrition:   2/23 stopped TF  -monitor intake.   8. A fib/NSVT: On amiodarone and metoprolol for  rate control.              Rate in 90's on 2/25   -isolated bradycardia yesterday   3/2--3/3: rates remain in 90's   -see #9.   3/6: rate in 80s  3/11: rate in 60s-70s.   Monitor with increased activity.  -intermittent SOB, CXR demonstrates CHF. Nephrology working on volume mgt with HD  3/12: HR in 50s to 60s.  9.  ESRD: HD MWF at the end of the day to help with activity tolerance.   -volume/weight mgt per nephrology,    3/3 weights stable  3/6: weight decreased  3/9: HD again today at noon  3/11: Labs reviewed. Cr and K+ improved with dialysis.  Filed Weights   02/23/20 1323 02/23/20 1733 02/24/20 0500  Weight: 64.9 kg 62.5 kg 62.6 kg  10. Pericardial effusion: Has been on colchicine since 1/19 and has been weaned to prednisone 10 mg daily. Last echo 1/29 shows decrease to small effusion.  3/7: Yesterday  evening he was complaining of increased shortness of breath and his breathing appeared more labored on examination yesterday. HD has not been able to remove as much fluid due to his hypotension. Gave 20mg  Lasix and his breathing is much improved today, appears comfortably. EKG obtained to assess qTC given Lasix administration, qTC is 432, much improved from prior.  3/8- Weight up 10 lbs in 1 day- will follow trend, since 10 lbs change unlikely.   3/9: Discussed with hospitalist Dr. Jerilee Hoh, she will schedule for thoracentesis with IR today.   3/10: Yesterday patient underwent successful US guided righ thoracentesis which yielded 411mL of amber fluid. Post-procedure XR reveals no pneumothorax.   3/11: breathing more comfortable.  11. Hypotension: Continue midodrine 10 mg tid.  BP's still soft 2/26, 3/1, 3/3. A little better 3/4  3/6: continue to be very soft.   3/8- BP 125/113- 120/94- BP doing better -con't regimen.  3/11: well controlled.  12. Dysphagia/PEG:   Eating fairly well now  2/26- had MBS ---upgraded to regular/thins  3/5: abdomen distended today with mild discomfort. has had two medium bm's over last 2 days.    -check kub given distention   -advised patient that he may have some discharge from around PEG. Change dressings as needed  3/6: likely having loose stools around constipated mass; KUB shows retained stool in bowel and questionable undigested food in stomach. Refused enema. Provided education about why he needs it but continues to refuse. Will order mag citrate to which he is agreeable.   3/7: refused mag citrate and enema yesterday. He is willing to take Senna HS and Docusate TID.   3/8- pt having dark brown/red bloody drainage around PEG_ like from hematoma- will have IR take a look at it today. LBM Saturday. Still refusing bowel meds. Not eating well, likely due to distension- refuses bowel meds, so have no way to fix distension.  3/10: Finally amenable to enema 3/9 and had  medium sized BM yesterday.   3/12: With some abdominal pain this morning; willing to get another enema. Ordered soap suds enema for this morning given renal function.  13. Dry gangrene bilateral feet: Needs to wear shoes for protection. Dry dressing. Maintain adequate nutritional status.    -he is having ongoing pain in feet, right more than left.   -both feet cool, especially right  -wounds appear stable to improved but demonstrate ongoing gangrene as well  3/2-4 -vascular following patient. May need toe amps eventually  -continue  local care, pain mgt  3/6: Patient continues to pick at wounds despite education not to.  14. Anxiety/depression: On Zoloft 25 mg/day.  Team support.  ECG with prolonged QTC 512  Continue melatonin  Klonopin increased  1 mg 3 times daily 15. Insomnia: Per father, he sleeps late at night at baseline.   -2/27 still having difficulties more often than not at night d/t confusion/agitation/anxiety   -resumed seroquel 25mg  qhs   -QTC actually was DECREASED last week after giving seroquel the other day.   3/2 sleep generally improved   -has baseline insomnia   -pain and anxiety often inhibit sleep   -QTC further decreased on  EKG   16. Phlegm with slight cough: 3/7 started Mucinex. 17. Encephalopathy: CT Head obtained yesterday, personally reviewed and shows improvement from prior. Mental status improved today.  18. Disposition: Unstable for discharge today. Plan for DC on Monday. Father was updated via phone.    LOS: 21 days A FACE TO FACE EVALUATION WAS PERFORMED  Janit Cutter P Jaelan Rasheed 02/24/2020, 9:00 AM

## 2020-02-24 NOTE — Progress Notes (Signed)
Occupational Therapy Session Note  Patient Details  Name: [name] [name] MRN: [id number] Date of Birth: 1966/05/28  Today's Date: 02/24/2020 OT Individual Time: 2301-7209 OT Individual Time Calculation (min): 55 min    Short Term Goals: Week 1:  OT Short Term Goal 1 (Week 1): STG=LTG  Skilled Therapeutic Interventions/Progress Updates:    Pt greeted seated in recliner, awake, and agreeable to OT treatment session. Pt declined bathing/dressing this morning, but wanted to brush his teeth. Pt needed min A to achieve sit<>stand from lower recliner. Pt then ambulated with supervision to the sink. Pt tolerated standing for 2 minutes while brushing his teeth, then returned to sitting to rest. Pt on 2L of O2 throughout session. Pt declined to walk to the gym 2/2 pain in B toes. OT propelled pt to gym in wc. Pt completed 5 mins x2 on [employer] arm bike on level 1 with 2 minute rest break in between sets. OT played pt preferred music during there-ex. Worked on standing balance/edurance with standing and dancing/swaying to the music. Pt returned to room and pivoted back to recliner with CGA. Pt left seated with alarm belt on, call bell in reach and needs met.   Therapy Documentation Precautions:  Precautions Precautions: Fall Precaution Comments: Peg, Darco shoes Restrictions Weight Bearing Restrictions: No Pain:  Pt reports 8/10 pain to B toes. OT repostioned and rest for comfort.    Therapy/Group: Individual Therapy  Valma Cava 02/24/2020, 12:59 PM

## 2020-02-24 NOTE — Progress Notes (Addendum)
[name] [name] Progress Note   Subjective: Up in chair eating. Refusing to come to HD unless he can eat.   Objective Vitals:   02/23/20 1733 02/23/20 1924 02/24/20 0440 02/24/20 0500  BP: 95/61 (!) 82/58 105/85   Pulse: 63 66 73   Resp: 18 16 18    Temp: 97.8 F (36.6 C) 98.2 F (36.8 C) 98.2 F (36.8 C)   TempSrc: Oral Oral Oral   SpO2: 97% 93% 95%   Weight: 62.5 kg   62.6 kg  Height:       Physical Exam General: Chronically ill appearing male in NAD Heart: S1,S2 RRR Lungs: Bilateral breath sounds decreased in bases with bibasilar crackles Abdomen: S, NT Peg intact Extremities:  Trace BLE edema Dialysis Access: R AVF + bruit  Additional Objective Labs: Basic Metabolic Panel: Recent Labs  Lab 02/17/20 1606 02/20/20 1201 02/21/20 0532 02/21/20 1322 02/22/20 1319  NA 136   < > 134* 133* 133*  K 3.7   < > 4.8 5.1 4.1  CL 95*   < > 91* 91* 94*  CO2 25   < > 21* 18* 23  GLUCOSE 102*   < > 83 100* 126*  BUN 40*   < > 57* 64* 34*  CREATININE 3.05*   < > 5.00* 5.36* 4.13*  CALCIUM 8.6*   < > 8.8* 8.6* 8.5*  PHOS 2.6  --   --  6.3* 5.3*   < > = values in this interval not displayed.   Liver Function Tests: Recent Labs  Lab 02/20/20 1201 02/21/20 1322 02/22/20 1319  AST 61*  --   --   ALT 63*  --   --   ALKPHOS 148*  --   --   BILITOT 1.4*  --   --   PROT 7.3  --   --   ALBUMIN 3.7 3.6 3.0*   No results for input(s): LIPASE, AMYLASE in the last 168 hours. CBC: Recent Labs  Lab 02/17/20 1606 02/17/20 1606 02/20/20 1201 02/20/20 1201 02/21/20 0532 02/21/20 1322 02/22/20 1319  WBC 8.6   < > 15.8*   < > 15.2* 14.2* 16.0*  NEUTROABS  --   --  12.7*  --  13.2*  --   --   HGB 12.7*   < > 13.9   < > 12.7* 12.7* 12.2*  HCT 40.4   < > 46.1   < > 40.8 40.8 38.9*  MCV 87.3  --  91.7  --  88.5 88.7 89.2  PLT 254   < > 293   < > 219 218 167   < > = values in this interval not displayed.   Blood Culture    Component Value Date/Time   SDES BLOOD  RIGHT HAND 12/17/2019 1520   SPECREQUEST  12/17/2019 1520    BOTTLES DRAWN AEROBIC ONLY Blood Culture results may not be optimal due to an inadequate volume of blood received in culture bottles   CULT  12/17/2019 1520    NO GROWTH 5 DAYS Performed at [facility], [place] [address] [place], [place] [address]    REPTSTATUS 12/22/2019 FINAL 12/17/2019 1520    Cardiac Enzymes: No results for input(s): CKTOTAL, CKMB, CKMBINDEX, TROPONINI in the last 168 hours. CBG: Recent Labs  Lab 02/20/20 1601 02/20/20 1830  GLUCAP 99 84   Iron Studies: No results for input(s): IRON, TIBC, TRANSFERRIN, FERRITIN in the last 72 hours. @lablastinr3 @ Studies/Results: CT HEAD WO CONTRAST  Result Date: 02/22/2020  CLINICAL DATA:  Lethargy EXAM: CT HEAD WITHOUT CONTRAST TECHNIQUE: Contiguous axial images were obtained from the base of the skull through the vertex without intravenous contrast. COMPARISON:  12/29/2019 FINDINGS: Brain: The brain shows a normal appearance without evidence of malformation, atrophy, old or acute small or large vessel infarction, mass lesion, hemorrhage, hydrocephalus or extra-axial collection. Vascular: There is atherosclerotic calcification of the major vessels at the base of the brain. Skull: Normal. No traumatic finding. No focal bone lesion. Sinuses/Orbits: Sinuses are clear. Orbits appear normal. Mastoids are clear. Other: Resolution of previously seen mastoid effusions. IMPRESSION: Normal head CT except for atherosclerotic calcification of the major vessels at the base of the brain. Resolution/improvement of previously seen mastoid effusions. Electronically Signed   By: Nelson Chimes M.D.   On: 02/22/2020 19:10   Medications: . piperacillin-tazobactam (ZOSYN)  IV 2.25 g (02/24/20 0502)   . amiodarone  200 mg Oral Daily  . apixaban  2.5 mg Oral BID  . ascorbic acid  500 mg Oral BID  . benztropine  1 mg Oral BID  . Chlorhexidine Gluconate Cloth  6 each Topical Q0600  .  colchicine  0.6 mg Oral Daily  . dextromethorphan-guaiFENesin  1 tablet Oral BID  . docusate  100 mg Oral TID  . feeding supplement (PRO-STAT SUGAR FREE 64)  30 mL Oral BID  . folic acid  1 mg Oral Daily  . free water  100 mL Per Tube BID  . levothyroxine  25 mcg Oral Q0600  . mouth rinse  15 mL Mouth Rinse BID  . Melatonin  3 mg Oral QHS  . metoprolol tartrate  25 mg Oral TID WC  . midodrine  10 mg Oral TID WC  . multivitamin  1 tablet Oral QHS  . predniSONE  10 mg Oral Q breakfast  . senna  2 tablet Oral QHS  . thiamine  100 mg Oral Daily     Dialysis Orders: Prev orders - will need to change significantly on d/c MWF at [facility], 4:15hr, EDW 74.5kg, 2K/2.5Ca, AVF, heparin 5K - Calcitriol 0.47mcg PO q HD. No ESA or venofer.   Assessment/Plan: 1. HTN/voloverload/ pulm edema:CXR 2/24and 2/28withpersistent pulm edema. CT 3/8 showed large right and small left pleural effusions with edema.Thoracentesis on 3/9 with 450 cc fluid removed.Soft BP limiting UF with HD,has been getting serial HD for volume this week and tolerated 2.5L UF yesterday, appears much improved today. Further UF with HD today, discussed fluid restrictions. Getting fluid off but not in large amts, will repeat CXR tonight, has had daily HD x 4 with 8 L removed in total and wt's are down about 5kg. Prob need to lower UF threshold to 75 or 80 SBP.  2 Deconditioning:S/p prolonged [facility] admit (12/2-12/23/19) s/p cardiac arrest, then [facility] until 2/21. Now CIR  3. Lethargy/ AMS: comes and goes 4.ESRD:Previously MWF schedule but changing to TTS at discharge. Last HD 3/8. Had hyperkalemia with K+ >7 on 3/8, K+ now5.1.Planned for daily HD until edema resolved.  5. Anemia:Hgb 12.2, no ESA indicated.  6. Secondary hyperparathyroidism:Caat goal, phos5.3. Trend phos.No binders/VDRA. 7. Hx cardiac arrest 8. Hx PE: On Eliquis 9. A-fib: On Eliquis + amiodarone 10. Hx pericardial effusion:colchicine +prednisone  10mg  QD. 11. HFrEF (<20%): volume management as above 12. 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 On gabapentin 100mg  TID which is an appropriate renal dose, caution using higher doses. 14.Constipation:large stool burdern on KUB 3/5.  No abdominal pain at present. Recommendavoidingmagnesium containing products as able in ESRD patients.  Rita H. Brown NP-C 02/24/2020, 12:37 PM  [name] [phone]  Pt seen, examined and agree w assess/plan as above with additions as indicated.  [facility] 02/24/2020, 3:05 PM

## 2020-02-25 ENCOUNTER — Inpatient Hospital Stay (HOSPITAL_COMMUNITY): Payer: Medicare Other | Admitting: Occupational Therapy

## 2020-02-25 ENCOUNTER — Inpatient Hospital Stay (HOSPITAL_COMMUNITY): Payer: Medicare Other

## 2020-02-25 NOTE — Progress Notes (Signed)
Occupational Therapy Session Note  Patient Details  Name: [name] [name] MRN: [id number] Date of Birth: July 17, 1966  Today's Date: 02/25/2020 OT Individual Time: 1101-1158 OT Individual Time Calculation (min): 57 min   Short Term Goals: Week 3:  OT Short Term Goal 1 (Week 3): LTG=STG 2/2 ELOS  Skilled Therapeutic Interventions/Progress Updates:    Pt greeted in the recliner, premedicated for foot pain per RN. He declined participating in bathing, dressing, or toileting tasks, appearing somewhat offended when OT asked if these self care needs were met. Explained to pt that OTs ask self care questions daily for therapeutic and hygiene purposes. Pt had his [place] removed, satting at 93% on RA. CGA for stand pivot<w/c without device after he donned his Darco shoes. Sats post transfer 78%, so his [place] was reapplied and pt was on 2L via portable tank for remainder of tx. He said he wanted to visit a nurse on the 5th floor who impacted his care at [facility]. Pt opting to propel w/c vs ambulate. Worked on UB strengthening during self propulsion, functional cognition by having pt remember RN's name and unit where she worked, also using hallway signs to pathfind his way there. Pt arrived at [facility] unit and asked for Mimi the nurse. She was off for the day, but staff called her on the phone so pt could talk to her briefly. Several staff members recognized pt and spoke with him which appeared to brighten pts affect. Pt verbalized gratefulness for the therapeutic care he received from the [facility] team. Afterwards pt continued to use external aides to find his way back to the central elevators, needing mod cues from OT. Once he returned to the room and completed stand pivot<recliner, he completed bilateral UE exercises using 4# bar 10-15 reps 2 sets with verbal guidance. Pt required rest breaks in between exercises due to fatigue. At end of session pt was left in the recliner, safety belt fastened, satting at  100% via room 02, all needs within reach, once again very grateful for the opportunity to visit the [facility] nursing team during tx.      Therapy Documentation Precautions:  Precautions Precautions: Fall Precaution Comments: Peg, Darco shoes Restrictions Weight Bearing Restrictions: No Pain: Pain Assessment Pain Score: 10-Worst pain ever ADL:        Therapy/Group: Individual Therapy  [name] A Dynesha Woolen 02/25/2020, 12:50 PM

## 2020-02-25 NOTE — Progress Notes (Addendum)
[facility] Progress Note   Subjective: Standing at sink with therapist. Looks much better. No C/Os. Low K+ noted.   Objective Vitals:   02/24/20 1614 02/24/20 1935 02/25/20 0542 02/25/20 0825  BP: 109/69 99/75 102/62 (!) 87/72  Pulse: 65 62 68 62  Resp: 18 17 19    Temp: (!) 97.5 F (36.4 C) 97.9 F (36.6 C) 98.8 F (37.1 C)   TempSrc: Oral     SpO2: 95% 100% 98%   Weight: 60.7 kg  60.2 kg   Height:       Physical Exam General: Chronically ill appearing male in NAD Heart: S1,S2 RRR Lungs: Bilateral breath sounds decreased R base, otherwise CTAB Abdomen: Slightly protuberant, peg intact Extremities: Trace BLE edema Dialysis Access: R AVF + bruit   Additional Objective Labs: Basic Metabolic Panel: Recent Labs  Lab 02/21/20 1322 02/22/20 1319 02/24/20 1324  NA 133* 133* 136  K 5.1 4.1 2.8*  CL 91* 94* 95*  CO2 18* 23 23  GLUCOSE 100* 126* 141*  BUN 64* 34* 33*  CREATININE 5.36* 4.13* 3.97*  CALCIUM 8.6* 8.5* 8.6*  PHOS 6.3* 5.3* 3.7   Liver Function Tests: Recent Labs  Lab 02/20/20 1201 02/20/20 1201 02/21/20 1322 02/22/20 1319 02/24/20 1324  AST 61*  --   --   --   --   ALT 63*  --   --   --   --   ALKPHOS 148*  --   --   --   --   BILITOT 1.4*  --   --   --   --   PROT 7.3  --   --   --   --   ALBUMIN 3.7   < > 3.6 3.0* 2.9*   < > = values in this interval not displayed.   No results for input(s): LIPASE, AMYLASE in the last 168 hours. CBC: Recent Labs  Lab 02/20/20 1201 02/20/20 1201 02/21/20 0532 02/21/20 0532 02/21/20 1322 02/22/20 1319 02/24/20 1324  WBC 15.8*   < > 15.2*   < > 14.2* 16.0* 11.2*  NEUTROABS 12.7*  --  13.2*  --   --   --   --   HGB 13.9   < > 12.7*   < > 12.7* 12.2* 12.8*  HCT 46.1   < > 40.8   < > 40.8 38.9* 42.9  MCV 91.7  --  88.5  --  88.7 89.2 92.1  PLT 293   < > 219   < > 218 167 194   < > = values in this interval not displayed.   Blood Culture    Component Value Date/Time   SDES BLOOD  RIGHT HAND 12/17/2019 1520   SPECREQUEST  12/17/2019 1520    BOTTLES DRAWN AEROBIC ONLY Blood Culture results may not be optimal due to an inadequate volume of blood received in culture bottles   CULT  12/17/2019 1520    NO GROWTH 5 DAYS Performed at [facility], [place] [address] [place], [place] [address]    REPTSTATUS 12/22/2019 FINAL 12/17/2019 1520    Cardiac Enzymes: No results for input(s): CKTOTAL, CKMB, CKMBINDEX, TROPONINI in the last 168 hours. CBG: Recent Labs  Lab 02/20/20 1601 02/20/20 1830  GLUCAP 99 84   Iron Studies: No results for input(s): IRON, TIBC, TRANSFERRIN, FERRITIN in the last 72 hours. @lablastinr3 @ Studies/Results: DG CHEST PORT 1 VIEW  Result Date: 02/24/2020 CLINICAL DATA:  Follow-up pleural effusion EXAM: PORTABLE CHEST 1  VIEW COMPARISON:  02/21/2020 FINDINGS: Cardiac shadow is enlarged but stable. Right basilar atelectasis and effusion are again seen and slightly increased when compared with the prior exam. Calcified granuloma is noted in the right lung stable from the prior study. No bony abnormality is seen. IMPRESSION: Slight increase in right-sided pleural effusion and basilar atelectasis. Electronically Signed   By: Inez Catalina M.D.   On: 02/24/2020 19:18   Medications: . piperacillin-tazobactam (ZOSYN)  IV 2.25 g (02/25/20 0554)   . amiodarone  200 mg Oral Daily  . apixaban  2.5 mg Oral BID  . ascorbic acid  500 mg Oral BID  . benztropine  1 mg Oral BID  . Chlorhexidine Gluconate Cloth  6 each Topical Q0600  . colchicine  0.6 mg Oral Daily  . dextromethorphan-guaiFENesin  1 tablet Oral BID  . docusate  100 mg Oral TID  . feeding supplement (PRO-STAT SUGAR FREE 64)  30 mL Oral BID  . folic acid  1 mg Oral Daily  . free water  100 mL Per Tube BID  . levothyroxine  25 mcg Oral Q0600  . mouth rinse  15 mL Mouth Rinse BID  . Melatonin  3 mg Oral QHS  . metoprolol tartrate  25 mg Oral TID WC  . midodrine  10 mg Oral TID WC  .  multivitamin  1 tablet Oral QHS  . predniSONE  10 mg Oral Q breakfast  . senna  2 tablet Oral QHS  . thiamine  100 mg Oral Daily     Dialysis Orders: Prev orders - will need to change significantly on d/c MWF at [facility], 4:15hr, EDW 74.5kg,450/Autoflow 1.5 2K/2.5Ca, AVF,  -heparin 5000 units IV TIW - Calcitriol 0.45mcg PO q HD.  -No ESA or venofer.   Assessment/Plan: 1. HTN/voloverload/ pulm edema:CXR 2/24and 2/28withpersistent pulm edema. CT 3/8 showed large right and small left pleural effusions with edema.Thoracentesis on 3/9 with 450 cc fluid removed.Soft BP limiting UF with HD,has been getting serial HD for volume this week and tolerated 2.5L UF yesterday, appears much improved today. Further UF with HD today, discussed fluid restrictions. Getting fluid off but not in large amts. Repeated  CXR 02/24/20 after having daily HD x 4 with 8 L removed in total and wt's are down about 5kg. CXR shows Slight increase in right-sided pleural effusion and basilar atelectasis. Lower UF threshold to 75 or 80 SBP. Down to 60kg and most of edema now gone, had 4HD in a row. BP's are soft but for him 80- 100 syst is okay. Will dc metoprolol which is ordered 25 tid but he is not getting it, had only 1-2 doses in the last week. HR's are good in 60- 80 range.  2 Deconditioning:S/p prolonged [facility] admit (12/2-12/23/19) s/p cardiac arrest, then [facility] admit until 2/21. Now CIR  3. Lethargy/ AMS: comes and goes 4.ESRD:Previously MWF schedule but changing to TTS at discharge. Last HD 3/8. Had hyperkalemia with K+ >7 on 3/8, K+ now5.1.Planned fordaily HD until edema resolved.Short HD today to get back on schedule.   5. Anemia:Hgb 12.8, no ESA indicated.  6. Secondary hyperparathyroidism:Caat goal, phos5.3. Trend phos.No binders/VDRA. 7. Hx cardiac arrest 8. Hx PE: On Eliquis 9. A-fib: On Eliquis + amiodarone 10. Hx pericardial effusion:colchicine +prednisone 10mg  QD. 11. HFrEF (<20%): volume  management as above 12. 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 On gabapentin 100mg  TID which is an appropriate renal dose, caution using  higher doses. 14.Constipation:large stool burdern on KUB 3/5. No abdominal pain at present. Recommendavoidingmagnesium containing products as able in ESRD patients. 15. BMD: Labs at goals.  16. Hypokalemia-K+ 2.8 yesterday. Rechecking today. Use 4.0 K bath  Rita H. Brown NP-C 02/25/2020, 10:12 AM  [facility] [phone]  Pt seen, examined and agree w assess/plan as above with additions as indicated.  [facility] 02/25/2020, 11:43 AM

## 2020-02-25 NOTE — Progress Notes (Addendum)
Physical Therapy Session Note  Patient Details  Name: [name] [name] MRN: [id number] Date of Birth: [birth year]  Today's Date: 02/25/2020 PT Individual Time: 0915-1015 PT Individual Time Calculation (min): 60 min   Short Term Goals: Week 1:  PT Short Term Goal 1 (Week 1): Pt will transfer to and from [facility] with min assist. PT Short Term Goal 1 - Progress (Week 1): Met PT Short Term Goal 2 (Week 1): Pt will ambulate 67f with min assist and RW PT Short Term Goal 2 - Progress (Week 1): Progressing toward goal PT Short Term Goal 3 (Week 1): Pt will attend to task >5 mintues with only min cues from PT PT Short Term Goal 3 - Progress (Week 1): Met PT Short Term Goal 4 (Week 1): Pt will performed bed mobility with CGA PT Short Term Goal 4 - Progress (Week 1): Met Week 2:  PT Short Term Goal 1 (Week 2): Pt will negotiate 4 steps with B rails with min assist. PT Short Term Goal 1 - Progress (Week 2): Met PT Short Term Goal 2 (Week 2): Pt will complete car transfer with LRAD & min assist. PT Short Term Goal 2 - Progress (Week 2): Met PT Short Term Goal 3 (Week 2): Pt will ambulate 100 ft with LRAD & min assist. PT Short Term Goal 3 - Progress (Week 2): Met Week 3:  PT Short Term Goal 1 (Week 3): STG = LTG due to estimated d/c date.  Skilled Therapeutic Interventions/Progress Updates:    PAIN Pt initially supine and with encouragement agreeable to treatment session with focus on am ADLs.  Later agreed to gait and therex. Supine to sit on edge of bed w/supervision.  Therapist assisted pt w/obtaining clean clothers from bureau, pt takes extra time choosing clothing items.   Pt removes pajama top and donns clean shirt w/supervision, much additional time, moving very slowly.  Pt requesting therapist leave from room while he performs lower body dressing.  Explained falls risk and need for assist in standing.  Pt agreed to peform dressing in sitting w/therapist turning away for modesty, however, pt attempts  to stand without assist and therapist provides mod assist w/first STS effort.  Pt cooperative w/assist from this point forward.   Pt insisted on donning pants fully in standing w/walker despite encouragment to perform in sitting/complete in standing, requires max assist to do so, cga for balance using RW.  Stand to sit w/cga and donns Darco shoes very slowly and w/min assist for fastening velcro properly.   STS from bed w/min assist and gait 158fto sink where he stood x 10 min w RW and supervision and performed oral hygiene/washes face and hands w/supervision.  MD in to see pt briefly while standing.  Pt turned 180 and maintained standing w/walker while MD performed ascultation.   Gait 10070f/RW and supervision, therapist assisting w/managing 02.  Turn/sit to wc w/supervision. Pt declined further functional mobility but agreed to cardiovascular conditioning.   transported to gym and performed SPT to Nustep w/RW and supervision. NuStep x 10 min w/exts x 4 for total steps 330 at L3. SPT to wc w/supervision w/RW. Pt transported back to room.  In standing performed reaching forward and to L/R below waist level w/min assist. SPT to recliner w/min assist w/o AD. Pt left oob in recliner w/chair alarm set and needs in reach.   Therapy Documentation Precautions:  Precautions Precautions: Fall Precaution Comments: Peg, Darco shoes Restrictions Weight Bearing Restrictions: No  Therapy/Group: Individual Therapy  Callie Fielding, [place] 02/25/2020, 10:30 AM

## 2020-02-25 NOTE — Progress Notes (Signed)
[name] [name]  Subjective/Complaints:  [name] [name] reports no issues- admits abd feels better  And is tolerating HD.  On O2 2L- sats 96-98%  Per Nephrology, can d/c Metoprolol due to soft BP/ has only received 2-3x in last week.  Still getting daily HD- short run today to get back on schedule Also K+ is 2.8 yesterday afternoon- just assessed- Renal wants to recheck and run on 4.0 K+ bath.    2 BMs overnight- medium in each case.    ROS: [name] [name] denies SOB (on O2), no CP, no Abd pain and no N/V/C/D  Objective: Vital Signs: Blood pressure (!) 87/72, pulse 62, temperature 98.8 F (37.1 C), resp. rate 19, height 5' 10.5" (1.791 m), weight 60.2 kg, SpO2 98 %. DG CHEST PORT 1 VIEW  Result Date: 02/24/2020 CLINICAL DATA:  Follow-up pleural effusion EXAM: PORTABLE CHEST 1 VIEW COMPARISON:  02/21/2020 FINDINGS: Cardiac shadow is enlarged but stable. Right basilar atelectasis and effusion are again seen and slightly increased when compared with the prior exam. Calcified granuloma is noted in the right lung stable from the prior study. No bony abnormality is seen. IMPRESSION: Slight increase in right-sided pleural effusion and basilar atelectasis. Electronically Signed   By: Inez Catalina M.D.   On: 02/24/2020 19:18   Recent Labs    02/24/20 1324  WBC 11.2*  HGB 12.8*  HCT 42.9  PLT 194   Recent Labs    02/24/20 1324  NA 136  K 2.8*  CL 95*  CO2 23  GLUCOSE 141*  BUN 33*  CREATININE 3.97*  CALCIUM 8.6*    Physical Exam: BP (!) 87/72 (BP Location: Right Arm)   Pulse 62   Temp 98.8 F (37.1 C)   Resp 19   Ht 5' 10.5" (1.791 m)   Wt 60.2 kg   SpO2 98%   BMI 18.77 kg/m  Constitutional: Slaying sprawled on bed; appears comfortable, watching TV, NAD HEENT: wearing O2 by [place] 2L- nose dry but mouth moist Neck: supple Cardiovascular: RRR- borderline bradycardia on exam Respiratory/Chest: coarse at bases, but otherwise CTA B/L- overall better-  good air movement overall.  GI/Abdomen: soft, much less distended- maybe mild now; NT; hypoactive BS; PEG in place- no drainage seen today Skin:   B/L toes gangrenous Psych: relaxed, appropriate Musc: No edema in extremities.  No tenderness in extremities. Neurological: Ox2- not sure of day Motor: 4- to /5 in all 4's with pain inhibition at the feet  Assessment/Plan: 1. Functional deficits secondary to encephalopathy which require 3+ hours per day of interdisciplinary therapy in a comprehensive inpatient rehab setting.  Physiatrist is providing close team supervision and 24 hour management of active medical problems listed below.  Physiatrist and rehab team continue to assess barriers to discharge/monitor patient progress toward functional and medical goals  Care Tool:  Bathing  Bathing activity did not occur: Refused Body parts bathed by patient: Right arm, Left arm, Chest, Abdomen, Front perineal area, Buttocks, Right upper leg, Left upper leg, Right lower leg, Left lower leg, Face   Body parts bathed by helper: Abdomen, Right lower leg, Left lower leg     Bathing assist Assist Level: Contact Guard/Touching assist     Upper Body Dressing/Undressing Upper body dressing Upper body dressing/undressing activity did not occur (including orthotics): N/A What is the patient wearing?: Pull over shirt    Upper body assist Assist Level: Supervision/Verbal cueing    Lower Body Dressing/Undressing Lower body dressing  Lower body dressing activity did not occur: N/A What is the patient wearing?: Pants     Lower body assist Assist for lower body dressing: Contact Guard/Touching assist     Toileting Toileting    Toileting assist Assist for toileting: Minimal Assistance - Patient > 75%     Transfers Chair/bed transfer  Transfers assist     Chair/bed transfer assist level: Minimal Assistance - Patient > 75%(no AD)     Locomotion Ambulation   Ambulation assist    Ambulation activity did not occur: N/A  Assist level: Supervision/Verbal cueing Assistive device: Walker-rolling Max distance: 100   Walk 10 feet activity   Assist     Assist level: Supervision/Verbal cueing Assistive device: Walker-rolling   Walk 50 feet activity   Assist    Assist level: Supervision/Verbal cueing Assistive device: Walker-rolling    Walk 150 feet activity   Assist Walk 150 feet activity did not occur: Safety/medical concerns  Assist level: Supervision/Verbal cueing Assistive device: Walker-rolling    Walk 10 feet on uneven surface  activity   Assist Walk 10 feet on uneven surfaces activity did not occur: Safety/medical concerns         Wheelchair     Assist Will patient use wheelchair at discharge?: Yes Type of Wheelchair: Manual Wheelchair activity did not occur: N/A  Wheelchair assist level: Minimal Assistance - Patient > 75% Max wheelchair distance: 150 ft    Wheelchair 50 feet with 2 turns activity    Assist        Assist Level: Minimal Assistance - Patient > 75%   Wheelchair 150 feet activity     Assist     Assist Level: Minimal Assistance - Patient > 75%      Medical Problem List and Plan: 1.  Deficits with mobility, endurance, self-care secondary to encephalopathy with debility, PVD with gangrenous toes.  -Continue CIR therapies including [name] [name], OT, and SLP   -[name] [name] with baseline cognitive deficits  -ELOS 3/9 2.  Antithrombotics: -PE/anticoagulation:  Pharmaceutical: Other (comment)--on low dose Eliquis             -antiplatelet therapy:  N/A 3. Pain Management: Oxycodone or tramadol prn.   2/24: Therapist notes that [name] [name] has been having hypersensitivity of his gangrenous toes. Will add low dose Gabapentin 100mg  TID to help with pain-- low dose due to ESRD.  2/28 continue gabapentin---seems to be tolerating so far  3/1: Pain is well controlled.   3/3: gabapentin increased slightly for foot/toe  pain.  3/5  Foot/toe pain seems better controlled   3/7: well controlled.   3/10: complaining of pain this morning. Gabapentin has been discontinued due to sedative potential given patient's renal function. Can encourage Tramadol use; received this morning.   3/11: Pain appears better controlled this morning.  4. Mood: LCSW to follow for evaluation and support.    See #14             -antipsychotic agents: N/A   See #14 5. Neuropsych: This patient is not fully capable of making decisions on his own behalf. 6. Skin/Wound Care: Dry dressing to bilateral feet.              trach stoma closed. PEG site intact 7. Fluids/Electrolytes/Nutrition:   2/23 stopped TF  -monitor intake.   8. A fib/NSVT: On amiodarone and metoprolol for rate control.              Rate in 90's on 2/25   -isolated bradycardia yesterday  3/2--3/3: rates remain in 90's   -see #9.   3/6: rate in 80s  3/11: rate in 60s-70s.  3/13- Metoprolol to be d/c'd by Renal since BP soft and rates in 60s without meds.    Monitor with increased activity.  -intermittent SOB, CXR demonstrates CHF. Nephrology working on volume mgt with HD  3/12: HR in 50s to 60s.  9.  ESRD: HD MWF at the end of the day to help with activity tolerance.   -volume/weight mgt per nephrology,    3/3 weights stable  3/6: weight decreased  3/9: HD again today at noon  3/11: Labs reviewed. Cr and K+ improved with dialysis.  3/13- K+ was 2.8- renal plans a 4.0 K+ bath- will recheck later today- still getting daily HD.   Filed Weights   02/24/20 1258 02/24/20 1614 02/25/20 0542  Weight: 63.6 kg 60.7 kg 60.2 kg  10. Pericardial effusion: Has been on colchicine since 1/19 and has been weaned to prednisone 10 mg daily. Last echo 1/29 shows decrease to small effusion.  3/7: Yesterday evening he was complaining of increased shortness of breath and his breathing appeared more labored on examination yesterday. HD has not been able to remove as much fluid due to  his hypotension. Gave 20mg  Lasix and his breathing is much improved today, appears comfortably. EKG obtained to assess qTC given Lasix administration, qTC is 432, much improved from prior.  3/8- Weight up 10 lbs in 1 day- will follow trend, since 10 lbs change unlikely.   3/9: Discussed with hospitalist Dr. Jerilee Hoh, she will schedule for thoracentesis with IR today.   3/10: Yesterday patient underwent successful US guided righ thoracentesis which yielded 440mL of amber fluid. Post-procedure XR reveals no pneumothorax.   3/11: breathing more comfortable.  11. Hypotension: Continue midodrine 10 mg tid.  BP's still soft 2/26, 3/1, 3/3. A little better 3/4  3/6: continue to be very soft.   3/8- BP 125/113- 120/94- BP doing better -con't regimen.  3/11: well controlled.   3/13- stopped metoprolol due to soft BPs 12. Dysphagia/PEG:   Eating fairly well now  2/26- had MBS ---upgraded to regular/thins  3/5: abdomen distended today with mild discomfort. has had two medium bm's over last 2 days.    -check kub given distention   -advised patient that he may have some discharge from around PEG. Change dressings as needed  3/6: likely having loose stools around constipated mass; KUB shows retained stool in bowel and questionable undigested food in stomach. Refused enema. Provided education about why he needs it but continues to refuse. Will order mag citrate to which he is agreeable.   3/7: refused mag citrate and enema yesterday. He is willing to take Senna HS and Docusate TID.   3/8- [name] [name] having dark brown/red bloody drainage around PEG_ like from hematoma- will have IR take a look at it today. LBM Saturday. Still refusing bowel meds. Not eating well, likely due to distension- refuses bowel meds, so have no way to fix distension.  3/10: Finally amenable to enema 3/9 and had medium sized BM yesterday.   3/12: With some abdominal pain this morning; willing to get another enema. Ordered soap suds enema for  this morning given renal function.  13. Dry gangrene bilateral feet: Needs to wear shoes for protection. Dry dressing. Maintain adequate nutritional status.    -he is having ongoing pain in feet, right more than left.   -both feet cool, especially right  -wounds appear stable to  improved but demonstrate ongoing gangrene as well  3/2-4 -vascular following patient. May need toe amps eventually  -continue local care, pain mgt  3/6: Patient continues to pick at wounds despite education not to.  14. Anxiety/depression: On Zoloft 25 mg/day.  Team support.  ECG with prolonged QTC 512  Continue melatonin  Klonopin increased  1 mg 3 times daily 15. Insomnia: Per father, he sleeps late at night at baseline.   -2/27 still having difficulties more often than not at night d/t confusion/agitation/anxiety   -resumed seroquel 25mg  qhs   -QTC actually was DECREASED last week after giving seroquel the other day.   3/2 sleep generally improved   -has baseline insomnia   -pain and anxiety often inhibit sleep   -QTC further decreased on  EKG   16. Phlegm with slight cough: 3/7 started Mucinex. 17. Encephalopathy: CT Head obtained yesterday, personally reviewed and shows improvement from prior. Mental status improved today.  18. Disposition: Unstable for discharge today. Plan for DC on Monday. Father was updated via phone.    LOS: 22 days A FACE TO FACE EVALUATION WAS PERFORMED  [name] [name] 02/25/2020, 1:20 PM

## 2020-02-25 NOTE — Plan of Care (Signed)
  Problem: Consults Goal: RH GENERAL PATIENT EDUCATION Description: See Patient Education module for education specifics. Outcome: Progressing   Problem: RH BOWEL ELIMINATION Goal: RH STG MANAGE BOWEL WITH ASSISTANCE Description: STG Manage Bowel with Assistance. mod Outcome: Progressing   Problem: RH SKIN INTEGRITY Goal: RH STG ABLE TO PERFORM INCISION/WOUND CARE W/ASSISTANCE Description: STG Able To Perform Incision/Wound Care With Assistance. Mod Outcome: Not Progressing   Problem: RH SAFETY Goal: RH STG ADHERE TO SAFETY PRECAUTIONS W/ASSISTANCE/DEVICE Description: STG Adhere to Safety Precautions With Assistance/Device. Mod Outcome: Progressing

## 2020-02-26 ENCOUNTER — Inpatient Hospital Stay (HOSPITAL_COMMUNITY): Payer: Medicare Other | Admitting: Speech Pathology

## 2020-02-26 ENCOUNTER — Inpatient Hospital Stay (HOSPITAL_COMMUNITY): Payer: Medicare Other | Admitting: Occupational Therapy

## 2020-02-26 ENCOUNTER — Inpatient Hospital Stay (HOSPITAL_COMMUNITY): Payer: Medicare Other

## 2020-02-26 MED ORDER — PREGABALIN 50 MG PO CAPS
50.0000 mg | ORAL_CAPSULE | Freq: Every day | ORAL | Status: DC
  Administered 2020-02-26: 50 mg via ORAL
  Filled 2020-02-26: qty 1

## 2020-02-26 MED ORDER — HYDRALAZINE HCL 10 MG PO TABS
10.0000 mg | ORAL_TABLET | Freq: Three times a day (TID) | ORAL | Status: DC | PRN
  Administered 2020-02-26: 10 mg via ORAL
  Filled 2020-02-26 (×2): qty 1

## 2020-02-26 NOTE — Progress Notes (Signed)
Physical Therapy Session Note  Patient Details  Name: [name] [name] MRN: [id number] Date of Birth: 25-Dec-1965  Today's Date: 02/26/2020 PT Individual Time: 1100-1200 PT Individual Time Calculation (min): 60 min   Short Term Goals: Week 1:  PT Short Term Goal 1 (Week 1): Pt will transfer to and from [facility] with min assist. PT Short Term Goal 1 - Progress (Week 1): Met PT Short Term Goal 2 (Week 1): Pt will ambulate 36f with min assist and RW PT Short Term Goal 2 - Progress (Week 1): Progressing toward goal PT Short Term Goal 3 (Week 1): Pt will attend to task >5 mintues with only min cues from PT PT Short Term Goal 3 - Progress (Week 1): Met PT Short Term Goal 4 (Week 1): Pt will performed bed mobility with CGA PT Short Term Goal 4 - Progress (Week 1): Met Week 2:  PT Short Term Goal 1 (Week 2): Pt will negotiate 4 steps with B rails with min assist. PT Short Term Goal 1 - Progress (Week 2): Met PT Short Term Goal 2 (Week 2): Pt will complete car transfer with LRAD & min assist. PT Short Term Goal 2 - Progress (Week 2): Met PT Short Term Goal 3 (Week 2): Pt will ambulate 100 ft with LRAD & min assist. PT Short Term Goal 3 - Progress (Week 2): Met Week 3:  PT Short Term Goal 1 (Week 3): STG = LTG due to estimated d/c date.  Skilled Therapeutic Interventions/Progress Updates:    PAIN 9/10 feet, nursing provided tylenol, treatment to tolerance.  Pt initially OOB in recliner and with encouragement from father agreeable to treatment session with focus on functional mobility training in prep for planned dc 3/15.  darco shoes donned by therapist.  STS from recliner w/supervision.  Gait 259fto wc placed in hall w/supervision, therapist managing 02 set at 2L.  wc mobility:  propells 15031fncluding 2 turns w/supervision only, therapist follows w/02tank. Propels slowly.  Gait: [id number] and supervision, slow cadence, assist for 02 tank only.  Picking up object from floor in standing - pt c/o  increased pain in bilat feet, deferred  Stairs:  Ascends/descends 4 stairs w/2 rails w/min assist for safety/balance, cues for sequencing.  Car transfer:  Performed by patient using rw w/supervision only. Moves slowly  Gait 10ft72funeven surface:  Supervision w/RW, slow cadence  Bed mobility - supervision for all  Bed to wc, wc to/from recliner supervision w/RW  At end of session, pt transported to room, wc to recliner w/supervision w/rw.  Pt left oob in recliner w/chair alarm set and needs in reach.  Good participation in session today.  Motivated by planned dc home 3/15.   Therapy Documentation Precautions:  Precautions Precautions: Fall Precaution Comments: Peg, Darco shoes Restrictions Weight Bearing Restrictions: No    Therapy/Group: Individual Therapy  BarbaCallie Fielding  [place]/2021, 12:22 PM

## 2020-02-26 NOTE — Progress Notes (Signed)
[place] PHYSICAL MEDICINE & REHABILITATION PROGRESS NOTE  Subjective/Complaints:   Pt reports cannot focus right now on SLP work because feet hurt so much- describes as nerve pain, not aching, throbbing, but burning- would like to change meds     ROS:  Pt denies SOB- still on O2, abd pain, CP, N/V/C/D, and vision changes   Objective: Vital Signs: Blood pressure (!) 115/99, pulse 69, temperature 98.3 F (36.8 C), resp. rate 19, height 5' 10.5" (1.791 m), weight 61.7 kg, SpO2 96 %. DG CHEST PORT 1 VIEW  Result Date: 02/24/2020 CLINICAL DATA:  Follow-up pleural effusion EXAM: PORTABLE CHEST 1 VIEW COMPARISON:  02/21/2020 FINDINGS: Cardiac shadow is enlarged but stable. Right basilar atelectasis and effusion are again seen and slightly increased when compared with the prior exam. Calcified granuloma is noted in the right lung stable from the prior study. No bony abnormality is seen. IMPRESSION: Slight increase in right-sided pleural effusion and basilar atelectasis. Electronically Signed   By: Inez Catalina M.D.   On: 02/24/2020 19:18   Recent Labs    02/24/20 1324  WBC 11.2*  HGB 12.8*  HCT 42.9  PLT 194   Recent Labs    02/24/20 1324  NA 136  K 2.8*  CL 95*  CO2 23  GLUCOSE 141*  BUN 33*  CREATININE 3.97*  CALCIUM 8.6*    Physical Exam: BP (!) 115/99 (BP Location: Right Arm)   Pulse 69   Temp 98.3 F (36.8 C)   Resp 19   Ht 5' 10.5" (1.791 m)   Wt 61.7 kg   SpO2 96%   BMI 19.24 kg/m  Constitutional: pt sitting up EOB with SLP doing memory work, NAD HEENT: wearing O2 by [place]: supple Cardiovascular: RRR- no M/R/G Respiratory/Chest: CTA B/L- better air movement GI/Abdomen: soft, much less distended- maybe mild now; NT; hypoactive BS; PEG in place- no drainage seen today Skin:   B/L toes gangrenous- cannot see- already wrapped- and won't let me unwrap Psych: appears in pain; perseverating on topic.  Musc: No edema in extremities.  No tenderness in  extremities. Neurological: Ox2-3- knew month, not day Motor: 4- to /5 in all 4's with pain inhibition at the feet  Assessment/Plan: 1. Functional deficits secondary to encephalopathy which require 3+ hours per day of interdisciplinary therapy in a comprehensive inpatient rehab setting.  Physiatrist is providing close team supervision and 24 hour management of active medical problems listed below.  Physiatrist and rehab team continue to assess barriers to discharge/monitor patient progress toward functional and medical goals  Care Tool:  Bathing  Bathing activity did not occur: Refused Body parts bathed by patient: Right arm, Left arm, Chest, Abdomen, Front perineal area, Buttocks, Right upper leg, Left upper leg, Right lower leg, Left lower leg, Face   Body parts bathed by helper: Abdomen, Right lower leg, Left lower leg     Bathing assist Assist Level: Contact Guard/Touching assist     Upper Body Dressing/Undressing Upper body dressing Upper body dressing/undressing activity did not occur (including orthotics): Refused What is the patient wearing?: Pull over shirt    Upper body assist Assist Level: Supervision/Verbal cueing    Lower Body Dressing/Undressing Lower body dressing    Lower body dressing activity did not occur: Refused What is the patient wearing?: Pants     Lower body assist Assist for lower body dressing: Contact Guard/Touching assist     Toileting Toileting Toileting Activity did not occur (Clothing management and hygiene only): Refused  Toileting assist Assist for toileting: Minimal Assistance - Patient > 75%     Transfers Chair/bed transfer  Transfers assist     Chair/bed transfer assist level: Minimal Assistance - Patient > 75%(no AD)     Locomotion Ambulation   Ambulation assist   Ambulation activity did not occur: N/A  Assist level: Supervision/Verbal cueing Assistive device: Walker-rolling Max distance: 100   Walk 10 feet  activity   Assist     Assist level: Supervision/Verbal cueing Assistive device: Walker-rolling   Walk 50 feet activity   Assist    Assist level: Supervision/Verbal cueing Assistive device: Walker-rolling    Walk 150 feet activity   Assist Walk 150 feet activity did not occur: Safety/medical concerns  Assist level: Supervision/Verbal cueing Assistive device: Walker-rolling    Walk 10 feet on uneven surface  activity   Assist Walk 10 feet on uneven surfaces activity did not occur: Safety/medical concerns         Wheelchair     Assist Will patient use wheelchair at discharge?: Yes Type of Wheelchair: Manual Wheelchair activity did not occur: N/A  Wheelchair assist level: Minimal Assistance - Patient > 75% Max wheelchair distance: 150 ft    Wheelchair 50 feet with 2 turns activity    Assist        Assist Level: Minimal Assistance - Patient > 75%   Wheelchair 150 feet activity     Assist     Assist Level: Minimal Assistance - Patient > 75%      Medical Problem List and Plan: 1.  Deficits with mobility, endurance, self-care secondary to encephalopathy with debility, PVD with gangrenous toes.  -Continue CIR therapies including PT, OT, and SLP   -pt with baseline cognitive deficits  -ELOS 3/9 2.  Antithrombotics: -PE/anticoagulation:  Pharmaceutical: Other (comment)--on low dose Eliquis             -antiplatelet therapy:  N/A 3. Pain Management: Oxycodone or tramadol prn.   2/24: Therapist notes that Mr. Tsutsui has been having hypersensitivity of his gangrenous toes. Will add low dose Gabapentin 100mg  TID to help with pain-- low dose due to ESRD.  2/28 continue gabapentin---seems to be tolerating so far  3/1: Pain is well controlled.   3/3: gabapentin increased slightly for foot/toe pain.  3/5  Foot/toe pain seems better controlled   3/7: well controlled.   3/10: complaining of pain this morning. Gabapentin has been discontinued due to  sedative potential given patient's renal function. Can encourage Tramadol use; received this morning.   3/11: Pain appears better controlled this morning.   3/14- will add Lyrica- usually less sedating than gabapentin 50 mg QHS for nerve pain- can titrate up only slightly in future due to ESRD/HD 4. Mood: LCSW to follow for evaluation and support.    See #14             -antipsychotic agents: N/A   See #14 5. Neuropsych: This patient is not fully capable of making decisions on his own behalf. 6. Skin/Wound Care: Dry dressing to bilateral feet.              trach stoma closed. PEG site intact 7. Fluids/Electrolytes/Nutrition:   2/23 stopped TF  -monitor intake.   8. A fib/NSVT: On amiodarone and metoprolol for rate control.              Rate in 90's on 2/25   -isolated bradycardia yesterday   3/2--3/3: rates remain in 90's   -see #9.  3/6: rate in 80s  3/11: rate in 60s-70s.  3/13- Metoprolol to be d/c'd by Renal since BP soft and rates in 60s without meds.    Monitor with increased activity.  -intermittent SOB, CXR demonstrates CHF. Nephrology working on volume mgt with HD  3/12: HR in 50s to 60s.  9.  ESRD: HD MWF at the end of the day to help with activity tolerance.   -volume/weight mgt per nephrology,    3/3 weights stable  3/6: weight decreased  3/9: HD again today at noon  3/11: Labs reviewed. Cr and K+ improved with dialysis.  3/13- K+ was 2.8- renal plans a 4.0 K+ bath- will recheck later today- still getting daily HD.   3/14- weight up 1.5 kg in 1 day - breathing stable- will see how does today.   Filed Weights   02/24/20 1614 02/25/20 0542 02/26/20 0427  Weight: 60.7 kg 60.2 kg 61.7 kg  10. Pericardial effusion: Has been on colchicine since 1/19 and has been weaned to prednisone 10 mg daily. Last echo 1/29 shows decrease to small effusion.  3/7: Yesterday evening he was complaining of increased shortness of breath and his breathing appeared more labored on examination  yesterday. HD has not been able to remove as much fluid due to his hypotension. Gave 20mg  Lasix and his breathing is much improved today, appears comfortably. EKG obtained to assess qTC given Lasix administration, qTC is 432, much improved from prior.  3/8- Weight up 10 lbs in 1 day- will follow trend, since 10 lbs change unlikely.   3/9: Discussed with hospitalist Dr. Jerilee Hoh, she will schedule for thoracentesis with IR today.   3/10: Yesterday patient underwent successful US guided righ thoracentesis which yielded 491mL of amber fluid. Post-procedure XR reveals no pneumothorax.   3/11: breathing more comfortable.  11. Hypotension: Continue midodrine 10 mg tid.  BP's still soft 2/26, 3/1, 3/3. A little better 3/4  3/6: continue to be very soft.   3/8- BP 125/113- 120/94- BP doing better -con't regimen.  3/11: well controlled.   3/13- stopped metoprolol due to soft BPs 12. Dysphagia/PEG:   Eating fairly well now  2/26- had MBS ---upgraded to regular/thins  3/5: abdomen distended today with mild discomfort. has had two medium bm's over last 2 days.    -check kub given distention   -advised patient that he may have some discharge from around PEG. Change dressings as needed  3/6: likely having loose stools around constipated mass; KUB shows retained stool in bowel and questionable undigested food in stomach. Refused enema. Provided education about why he needs it but continues to refuse. Will order mag citrate to which he is agreeable.   3/7: refused mag citrate and enema yesterday. He is willing to take Senna HS and Docusate TID.   3/8- pt having dark brown/red bloody drainage around PEG_ like from hematoma- will have IR take a look at it today. LBM Saturday. Still refusing bowel meds. Not eating well, likely due to distension- refuses bowel meds, so have no way to fix distension.  3/10: Finally amenable to enema 3/9 and had medium sized BM yesterday.   3/12: With some abdominal pain this  morning; willing to get another enema. Ordered soap suds enema for this morning given renal function.  13. Dry gangrene bilateral feet: Needs to wear shoes for protection. Dry dressing. Maintain adequate nutritional status.    -he is having ongoing pain in feet, right more than left.   -both feet cool,  especially right  -wounds appear stable to improved but demonstrate ongoing gangrene as well  3/2-4 -vascular following patient. May need toe amps eventually  -continue local care, pain mgt  3/6: Patient continues to pick at wounds despite education not to.  14. Anxiety/depression: On Zoloft 25 mg/day.  Team support.  ECG with prolonged QTC 512  Continue melatonin  Klonopin increased  1 mg 3 times daily 15. Insomnia: Per father, he sleeps late at night at baseline.   -2/27 still having difficulties more often than not at night d/t confusion/agitation/anxiety   -resumed seroquel 25mg  qhs   -QTC actually was DECREASED last week after giving seroquel the other day.   3/2 sleep generally improved   -has baseline insomnia   -pain and anxiety often inhibit sleep   -QTC further decreased on  EKG   16. Phlegm with slight cough: 3/7 started Mucinex. 17. Encephalopathy: CT Head obtained yesterday, personally reviewed and shows improvement from prior. Mental status improved today.  18. Disposition: Unstable for discharge today. Plan for DC on Monday. Father was updated via phone.   3/14- d/c planned for tomorrow   LOS: 23 days A FACE TO [place] 02/26/2020, 9:56 AM

## 2020-02-26 NOTE — Progress Notes (Signed)
Per dialysis patient will not have dialysis today

## 2020-02-26 NOTE — Progress Notes (Signed)
Speech Language Pathology Daily Session Note  Patient Details  Name: Himmat Enberg MRN: [id number] Date of Birth: Feb 21, 1966  Today's Date: 02/26/2020 SLP Individual Time: 0732-0816 SLP Individual Time Calculation (min): 44 min  Short Term Goals: Week 3: SLP Short Term Goal 1 (Week 3): STGs=LTGs due ELOS  Skilled Therapeutic Interventions: Pt was seen for skilled ST targeting cognitive goals. Pt was somewhat unsettled by unfamiliarity of this SLP (not pt's primary), requiring extra time to build rapport prior to structured tasks. Pt required Min A verbal cues to identify 1 cognitive impairment targeted in ST session (short term memory). He completed a novel semi-complex card task with overall Supervision A verbal cues for problem solving and recall, Min A for redirection to task (somewhat distracted by foot pain). Min A question cues also provided for initiation to request pain medication from his RN. Pt left sitting edge of bed with alarm set and needs within reach. Continue per current plan of care.         Pain Pain Assessment Pain Scale: 0-10 Pain Score: 9  Pain Type: Acute pain Pain Location: Foot Pain Orientation: Right;Left Pain Descriptors / Indicators: Aching Pain Frequency: Constant Pain Onset: On-going Patients Stated Pain Goal: 0 Pain Intervention(s): Medication (See eMAR)  Therapy/Group: Individual Therapy  Arbutus Leas 02/26/2020, 8:00 AM

## 2020-02-26 NOTE — Progress Notes (Signed)
This nurse notified Dr.Lovorn that patient did not have dialysis yesterday and will not have dialysis today. Current BP 148/120 and heart rate 68. New orders given for hydralazine. Will continue to monitor.

## 2020-02-26 NOTE — Progress Notes (Signed)
Occupational Therapy Session Note  Patient Details  Name: [name] [name] MRN: [id number] Date of Birth: 10/31/1966  Today's Date: 02/26/2020 OT Individual Time: 9458-5929 OT Individual Time Calculation (min): 55 min   Short Term Goals: Week 3:  OT Short Term Goal 1 (Week 3): LTG=STG 2/2 ELOS  Skilled Therapeutic Interventions/Progress Updates:    Pt greeted EOB, reporting increase in bilateral foot pain this morning but premedicated for it. Declining participation in self care tasks and ambulation during session. Agreeable to engage in novel therapeutic activities. Stand pivot<w/c completed with supervision assist after he donned Darco shoes. Pt was then escorted down to the dayroom. To focus on UE strengthening, functional cognition via new learning, and activity tolerance pt engaged in [employer] boxing and wii bowling. Pt was given several opportunities to stand during session however he declined due to foot pain. Pt required mod vcs during these activities to recall discussed controls and instructions. Afterwards he self propelled back to the room, needing mod cuing for pathfinding. Stand pivot<recliner completed with supervision assist. Left him with all needs within reach and safety belt fastened, hooked back up to room 02 and satting at 94% on 2L. He is looking forward to visiting with nurse Mimi today!   Therapy Documentation Precautions:  Precautions Precautions: Fall Precaution Comments: Peg, Darco shoes Restrictions Weight Bearing Restrictions: No Pain: OT recommended elevating B LEs when he was sitting in recliner to help with pain mgt, however pt declined  Pain Assessment Pain Scale: 0-10 Pain Score: 9  Pain Type: Acute pain Pain Location: Foot Pain Orientation: Right;Left Pain Descriptors / Indicators: Aching Pain Frequency: Constant Pain Onset: On-going Pain Intervention(s): Medication (See eMAR) ADL:       Therapy/Group: Individual Therapy  Markesia Crilly A  Kayelee Herbig 02/26/2020, 12:26 PM

## 2020-02-27 ENCOUNTER — Telehealth: Payer: Self-pay

## 2020-02-27 ENCOUNTER — Inpatient Hospital Stay (HOSPITAL_COMMUNITY): Payer: Medicare Other | Admitting: Occupational Therapy

## 2020-02-27 ENCOUNTER — Ambulatory Visit: Payer: Medicare Other | Admitting: Student

## 2020-02-27 ENCOUNTER — Inpatient Hospital Stay (HOSPITAL_COMMUNITY): Payer: Medicare Other | Admitting: Physical Therapy

## 2020-02-27 ENCOUNTER — Inpatient Hospital Stay (HOSPITAL_COMMUNITY): Payer: Medicare Other | Admitting: Speech Pathology

## 2020-02-27 MED ORDER — VITAMIN C 250 MG PO TABS: 250.0000 mg | ORAL_TABLET | Freq: Two times a day (BID) | ORAL | Status: AC

## 2020-02-27 MED ORDER — DM-GUAIFENESIN ER 30-600 MG PO TB12: 1.0000 | ORAL_TABLET | Freq: Two times a day (BID) | ORAL | 0 refills | Status: DC

## 2020-02-27 MED ORDER — TRAMADOL HCL 50 MG PO TABS: 50.0000 mg | ORAL_TABLET | Freq: Four times a day (QID) | ORAL | 0 refills | Status: DC | PRN

## 2020-02-27 MED ORDER — MIDODRINE HCL 10 MG PO TABS: 10.0000 mg | ORAL_TABLET | Freq: Three times a day (TID) | ORAL | 1 refills | Status: AC

## 2020-02-27 MED ORDER — ACETAMINOPHEN 325 MG PO TABS: 325.0000 mg | ORAL_TABLET | ORAL | Status: AC | PRN

## 2020-02-27 MED ORDER — PREDNISONE 10 MG PO TABS: 10.0000 mg | ORAL_TABLET | Freq: Every day | ORAL | 1 refills | Status: AC

## 2020-02-27 MED ORDER — PREGABALIN 50 MG PO CAPS: 50.0000 mg | ORAL_CAPSULE | Freq: Every day | ORAL | 1 refills | Status: DC

## 2020-02-27 MED ORDER — RENA-VITE PO TABS: 1.0000 | ORAL_TABLET | Freq: Every day | ORAL | 0 refills | Status: DC

## 2020-02-27 MED ORDER — PROCHLORPERAZINE MALEATE 5 MG PO TABS: 5.0000 mg | ORAL_TABLET | Freq: Four times a day (QID) | ORAL | 0 refills | Status: AC | PRN

## 2020-02-27 MED ORDER — DOCUSATE SODIUM 100 MG PO CAPS: 100.0000 mg | ORAL_CAPSULE | Freq: Every day | ORAL | 2 refills | Status: DC

## 2020-02-27 MED ORDER — AMIODARONE HCL 200 MG PO TABS: 200.0000 mg | ORAL_TABLET | Freq: Every day | ORAL | 1 refills | Status: AC

## 2020-02-27 MED ORDER — FREE WATER: 100.0000 mL | Freq: Two times a day (BID) | Status: DC

## 2020-02-27 MED ORDER — MELATONIN 3 MG PO TABS: 3.0000 mg | ORAL_TABLET | Freq: Every day | ORAL | 0 refills | Status: AC

## 2020-02-27 MED ORDER — PRO-STAT SUGAR FREE PO LIQD: 30.0000 mL | Freq: Two times a day (BID) | ORAL | 0 refills | Status: DC

## 2020-02-27 MED ORDER — APIXABAN 2.5 MG PO TABS: 2.5000 mg | ORAL_TABLET | Freq: Two times a day (BID) | ORAL | 1 refills | Status: DC

## 2020-02-27 MED ORDER — SENNOSIDES-DOCUSATE SODIUM 8.6-50 MG PO TABS: 1.0000 | ORAL_TABLET | Freq: Every day | ORAL | 0 refills | Status: DC

## 2020-02-27 MED ORDER — COLCHICINE 0.6 MG PO TABS: 0.6000 mg | ORAL_TABLET | Freq: Every day | ORAL | 1 refills | Status: AC

## 2020-02-27 MED ORDER — FOLIC ACID 1 MG PO TABS: 1.0000 mg | ORAL_TABLET | Freq: Every day | ORAL | 1 refills | Status: AC

## 2020-02-27 MED ORDER — LEVOTHYROXINE SODIUM 25 MCG PO TABS: 25.0000 ug | ORAL_TABLET | Freq: Every day | ORAL | 1 refills | Status: AC

## 2020-02-27 MED ORDER — BENZTROPINE MESYLATE 1 MG PO TABS: 1.0000 mg | ORAL_TABLET | Freq: Two times a day (BID) | ORAL | 1 refills | Status: AC

## 2020-02-27 MED ORDER — THIAMINE HCL 100 MG PO TABS: 100.0000 mg | ORAL_TABLET | Freq: Every day | ORAL | 1 refills | Status: AC

## 2020-02-27 NOTE — Progress Notes (Signed)
Speech Language Pathology Discharge Summary  Patient Details  Name: [name] [name] MRN: [id number] Date of Birth: [birth year]   Patient has met 8 of 8 long term goals.  Patient to discharge at overall Min;Mod level.   Reasons goals not met: N/A   Clinical Impression/Discharge Summary: Patient has made functional gains and has met 8 of 8 LTGs this admission. Currently, patient is consuming regular textures with thin liquids with minimal overt s/s of aspiration and is overall Mod I for use of swallowing compensatory strategies. Patient continues to demonstrate a hoarse vocal quality but improve intelligibility with use of an increased vocal intensity and overall Min A verbal cues. Patient also requires overall Min-Mod A verbal cues to complete functional and familiar tasks safely in regards to problem solving, recall with use of compensatory strategies, awareness and attention. Patient and family education is complete and patient will discharge home with 24 hour supervision from family. Patient would benefit from f/u home health SLP services to maximize his cognitive and speech function in order to reduce caregiver burden.   Care Partner:  Caregiver Able to Provide Assistance: Yes     Recommendation:  Home Health SLP;24 hour supervision/assistance  Rationale for SLP Follow Up: Maximize functional communication;Maximize cognitive function and independence;Reduce caregiver burden   Equipment: N/A   Reasons for discharge: Discharged from hospital;Treatment goals met   Patient/Family Agrees with Progress Made and Goals Achieved: Yes    [name] 02/27/2020, 2:27 PM

## 2020-02-27 NOTE — Plan of Care (Signed)
  Problem: Consults Goal: RH GENERAL PATIENT EDUCATION Description: See Patient Education module for education specifics. 02/27/2020 1934 by Amanda Cockayne, LPN Outcome: Completed/Met 02/27/2020 1104 by Amanda Cockayne, LPN Outcome: Progressing   Problem: RH BOWEL ELIMINATION Goal: RH STG MANAGE BOWEL WITH ASSISTANCE Description: STG Manage Bowel with Assistance. mod 02/27/2020 1934 by Toy Cookey F, LPN Outcome: Completed/Met 02/27/2020 1104 by Amanda Cockayne, LPN Outcome: Progressing   Problem: RH SKIN INTEGRITY Goal: RH STG SKIN FREE OF INFECTION/BREAKDOWN Description: Free of breakdown and infection with mod assist 02/27/2020 1934 by Amanda Cockayne, LPN Outcome: Completed/Met 02/27/2020 1104 by Amanda Cockayne, LPN Outcome: Progressing Goal: RH STG ABLE TO PERFORM INCISION/WOUND CARE W/ASSISTANCE Description: STG Able To Perform Incision/Wound Care With Assistance. Mod 02/27/2020 1934 by Amanda Cockayne, LPN Outcome: Completed/Met 02/27/2020 1104 by Amanda Cockayne, LPN Outcome: Progressing   Problem: RH SAFETY Goal: RH STG ADHERE TO SAFETY PRECAUTIONS W/ASSISTANCE/DEVICE Description: STG Adhere to Safety Precautions With Assistance/Device. Mod 02/27/2020 1934 by Amanda Cockayne, LPN Outcome: Completed/Met 02/27/2020 1104 by Amanda Cockayne, LPN Outcome: Progressing   Problem: RH PAIN MANAGEMENT Goal: RH STG PAIN MANAGED AT OR BELOW PT'S PAIN GOAL Description: Less than 4 02/27/2020 1934 by Amanda Cockayne, LPN Outcome: Completed/Met 02/27/2020 1104 by Amanda Cockayne, LPN Outcome: Progressing

## 2020-02-27 NOTE — Progress Notes (Signed)
Occupational Therapy Discharge Summary  Patient Details  Name: [name] [name] MRN: [id number] Date of Birth: September 20, 1966  Today's Date: 02/27/2020 OT Individual Time: 1100-1125 OT Individual Time Calculation (min): 25 min   Pt greeted sitting in recliner with father present on RA. Pt SpO2 at 89-90% at rest. OT educated on deep breathing techniques and O2 increased to 92-93%. OT educated on home bathroom transfers and BADL modifications in home environment. Pt frustrated and just wants to go home. OT educated on need to obtain O2 sats and pt agreeable. Pt ambulated 50f with RW supervision on RA, O2 sat dropped to 83% and pt reported needing to sit and rest. O2 sat increasing to 87% with verbal cues for pursed lip breathing. Pt placed on 2L of O2 and walked another 10 ft with SpO2 maintained at 95% and above.   Patient saturations on room air at rest = 89-90% Patient saturations on room air while ambulating = 83-85% Patient saturations on 2 Liters of oxygen while ambulating = 95%  Pt left seated in wc at end of session with alarm belt on, call bell in reach, and needs met.    Patient has met 13 of 13 long term goals due to improved activity tolerance, improved balance, postural control, ability to compensate for deficits, improved attention, improved awareness and improved coordination.  Patient to discharge at overall Supervision level.  Patient's care partner is independent to provide the necessary physical assistance at discharge.    Reasons goals not met: n/a   Recommendation:  Patient will benefit from ongoing skilled OT services in home health setting to continue to advance functional skills in the area of BADL.  Equipment: wc, RW  Reasons for discharge: treatment goals met and discharge from hospital  Patient/family agrees with progress made and goals achieved: Yes  OT Discharge Precautions/Restrictions  Precautions Precautions: Fall Precaution Comments:  Darco  shoes Restrictions Weight Bearing Restrictions: No Pain Pain Assessment Pain Scale: 0-10 Pain Score: 0-No pain ADL ADL Eating: Independent Grooming: Supervision/safety Upper Body Bathing: Supervision/safety Lower Body Bathing: Supervision/safety Upper Body Dressing: Supervision/safety Lower Body Dressing: Supervision/safety Toileting: Supervision/safety Toilet Transfer: Close supervision Perception  Perception: Within Functional Limits Cognition Orientation Level: Oriented to person;Oriented to place;Oriented to situation Memory: Impaired Awareness: Impaired Problem Solving: Impaired Behaviors: Poor frustration tolerance Safety/Judgment: Impaired Sensation Sensation Light Touch: Impaired by gross assessment Additional Comments: limited sensation in bil feet. Coordination Gross Motor Movements are Fluid and Coordinated: No Fine Motor Movements are Fluid and Coordinated: No Coordination and Movement Description: decreased smoothness and accuracy-improved since eval Mobility  Bed Mobility Supine to Sit: Supervision/Verbal cueing Sit to Supine: Supervision/Verbal cueing Transfers Sit to Stand: Supervision/Verbal cueing Stand to Sit: Supervision/Verbal cueing  Balance Balance Balance Assessed: Yes Static Sitting Balance Static Sitting - Balance Support: No upper extremity supported Static Sitting - Level of Assistance: 6: Modified independent (Device/Increase time) Dynamic Sitting Balance Dynamic Sitting - Balance Support: No upper extremity supported Dynamic Sitting - Level of Assistance: 6: Modified independent (Device/Increase time) Static Standing Balance Static Standing - Balance Support: During functional activity Static Standing - Level of Assistance: 5: Stand by assistance Dynamic Standing Balance Dynamic Standing - Balance Support: During functional activity Dynamic Standing - Level of Assistance: 5: Stand by assistance Extremity/Trunk Assessment RUE  Assessment RUE Assessment: Exceptions to [facility]General Strength Comments: generalized weakness 4/5 LUE Assessment LUE Assessment: Exceptions to WLafayette General End[facility] IncGeneral Strength Comments:  4/5   EDaneen SchickDoe 02/27/2020, 11:57 AM

## 2020-02-27 NOTE — Discharge Summary (Signed)
Physician Discharge Summary  Patient ID: [name] [name] MRN: [id number] DOB/AGE: 52-Dec-1967 52 y.o.  Admit date: 02/03/2020 Discharge date: 02/27/2020  Discharge Diagnoses:  Principal Problem:   Encephalopathy Active Problems:   Chronic combined systolic and diastolic heart failure (HCC)   ESRD needing dialysis (HCC)   Debility   Dry gangrene (HCC)--multiple toes   S/P percutaneous endoscopic gastrostomy (PEG) tube placement (HCC)   Sleep disturbance   Generalized anxiety disorder   Dysphagia   Pleural effusion   Supplemental oxygen dependent   Discharged Condition:  Stable   Significant Diagnostic Studies: DG Chest 1 View  Result Date: 02/21/2020 CLINICAL DATA:  Status post right thoracentesis, 450 cc of right side. EXAM: CHEST  1 VIEW COMPARISON:  Radiograph and CT yesterday. FINDINGS: Decreased right pleural effusion after thoracentesis. Improving aeration of the right lung base. Persistent right pleural fluid which tracks into the minor fissure. No visualized pneumothorax. Cardiomegaly and minimal left pleural effusion again seen. Interstitial thickening suspicious for pulmonary edema. No new airspace disease. IMPRESSION: 1. Decreased right pleural effusion after thoracentesis with improving aeration at the right lung base. No visualized pneumothorax. 2. Stable cardiomegaly, probable small left pleural effusion and interstitial thickening likely edema. Electronically Signed   By: Keith Rake M.D.   On: 02/21/2020 15:12   DG Chest 2 View  Result Date: 02/05/2020 CLINICAL DATA:  Shortness of breath and cough EXAM: CHEST - 2 VIEW COMPARISON:  Chest radiograph dated 01/12/2020 FINDINGS: The cardiac silhouette is obscured but appears unchanged. Vascular calcifications are seen in the aortic arch. Bibasilar atelectasis/airspace disease appears similar to prior exam. Small bilateral pleural effusions are noted. Diffuse bilateral interstitial opacities likely represent pulmonary edema.  There is no pneumothorax. IMPRESSION: Cardiomegaly and pulmonary edema. Small bilateral pleural effusions with associated atelectasis/airspace disease may represent pneumonia. Electronically Signed   By: Zerita Boers M.D.   On: 02/05/2020 12:16   DG Abd 1 View  Result Date: 02/17/2020 CLINICAL DATA:  Abdominal pain, constipation EXAM: ABDOMEN - 1 VIEW COMPARISON:  None. FINDINGS: There is a gastrostomy tube present. Large amount of mottled material distending the stomach which may reflect large amount of inspissated post feed residuals. Moderate amount of stool in the ascending colon. There is no evidence of pneumoperitoneum, portal venous gas or pneumatosis. There are no pathologic calcifications along the expected course of the ureters. The osseous structures are unremarkable. IMPRESSION: Large amount of mottled material distending the stomach which may reflect large amount of inspissated post feed residuals. Electronically Signed   By: Kathreen Devoid   On: 02/17/2020 10:43   CT HEAD WO CONTRAST  Result Date: 02/22/2020 CLINICAL DATA:  Lethargy EXAM: CT HEAD WITHOUT CONTRAST TECHNIQUE: Contiguous axial images were obtained from the base of the skull through the vertex without intravenous contrast. COMPARISON:  12/29/2019 FINDINGS: Brain: The brain shows a normal appearance without evidence of malformation, atrophy, old or acute small or large vessel infarction, mass lesion, hemorrhage, hydrocephalus or extra-axial collection. Vascular: There is atherosclerotic calcification of the major vessels at the base of the brain. Skull: Normal. No traumatic finding. No focal bone lesion. Sinuses/Orbits: Sinuses are clear. Orbits appear normal. Mastoids are clear. Other: Resolution of previously seen mastoid effusions. IMPRESSION: Normal head CT except for atherosclerotic calcification of the major vessels at the base of the brain. Resolution/improvement of previously seen mastoid effusions. Electronically Signed   By:  Nelson Chimes M.D.   On: 02/22/2020 19:10   CT CHEST W CONTRAST  Result Date: 02/20/2020 CLINICAL  DATA:  Hypoxemia. Abdominal pain. End-stage renal disease. EXAM: CT CHEST, ABDOMEN, AND PELVIS WITH CONTRAST TECHNIQUE: Multidetector CT imaging of the chest, abdomen and pelvis was performed following the standard protocol during bolus administration of intravenous contrast. CONTRAST:  165mL OMNIPAQUE IOHEXOL 300 MG/ML  SOLN COMPARISON:  Multiple exams, including CT abdomen from 01/04/2020 and CT chest from 11/16/2019 FINDINGS: CT CHEST FINDINGS Cardiovascular: Moderate to prominent cardiomegaly. Atherosclerotic calcification of the aortic arch. Enlarged main pulmonary artery indicating pulmonary arterial hypertension. Prominent left upper extremity venous structures, favoring left upper extremity av fistula. Large pericardial effusion shown on the CT abdomen from 01/04/2020 has resolved. Mediastinum/Nodes: Scattered small level V lymph nodes in the lower neck. No pathologic thoracic adenopathy. Gas-filled upper esophagus. Lungs/Pleura: Large right and small left pleural effusions, with a notable grade passive atelectasis in the right lung. No appreciable enhancement along the pleural margins to differentiate between exudative in transudative etiology. Bilateral hazy alveolar opacities in both lungs suspicious for edema. Atelectasis and airway plugging in the superior segment left lower lobe with a possible cavitary process in the bandlike density in the left lower lobe superiorly as shown on image 73/4. There is some air trapping in the upper portion of the superior segment left lower lobe. Musculoskeletal: Degenerative glenohumeral arthropathy, right greater than left. Osteoid and early callus formation along the multiple bilateral anterior rib fractures related to prior CPR. Nondisplaced healing transverse sternal fractures are present in the upper mid, and lower sternal body as shown on image 134/6. CT ABDOMEN  PELVIS FINDINGS Hepatobiliary: The liver is initially imaged prior to portal vein opacification. Small gallstone in the gallbladder. Mild gallbladder wall thickening. Difficult to exclude early cirrhosis based on hepatic morphology. Pancreas: Unremarkable Spleen: Unremarkable Adrenals/Urinary Tract: The adrenal glands appear normal. Innumerable renal cysts of varying complexity, without enlargement of the kidneys, favoring acquired cystic disease of dialysis. Stomach/Bowel: The stomach is mildly distended with food material. Peg tube noted. No dilated small or large bowel identified. Vascular/Lymphatic: Mild aortoiliac atherosclerotic vascular disease. Scattered small likely reactive periaortic lymph nodes. Right external iliac node measuring 0.8 cm in short axis on image 100/3 has a fatty hilum. No overtly pathologic adenopathy is identified. Patent portal vein and SMV. Patent splenic vein. Reproductive: Unremarkable Other: Scattered ascites in the abdomen or pelvis. Musculoskeletal: Indirect left inguinal hernia contains adipose tissue and fluid. 3 mm degenerative retrolisthesis of L5-S1 with associated degenerative disc disease. Notably reduced size of the psoas muscle collections IMPRESSION: 1. Large right and small left pleural effusions with passive atelectasis in the right lung. 2. Bilateral hazy alveolar opacities in both lungs suspicious for edema. 3. Atelectasis and airway plugging in the superior segment left lower lobe with a possible cavitary process in the bandlike density in the left lower lobe superiorly. 4. Moderate to prominent cardiomegaly. 5. Enlarged main pulmonary artery indicating pulmonary arterial hypertension. 6. Scattered ascites in the abdomen or pelvis. 7. Innumerable renal cysts of varying complexity, favoring acquired cystic disease of dialysis. 8. Healing transverse sternal fractures. 9. Cholelithiasis. 10. Indirect left inguinal hernia contains adipose tissue and fluid. 11.  Degenerative glenohumeral arthropathy, right greater than left. 12. Prominent left upper extremity venous structures, favoring left upper extremity av fistula. 13. Notably reduced size of the psoas muscle densities shown on the 01/04/2020 exam, likely reflecting resolving psoas muscle hematomas. Aortic Atherosclerosis (ICD10-I70.0). Electronically Signed   By: Van Clines M.D.   On: 02/20/2020 20:13   CT ABDOMEN PELVIS W CONTRAST  Result Date: 02/20/2020 CLINICAL  DATA:  Hypoxemia. Abdominal pain. End-stage renal disease. EXAM: CT CHEST, ABDOMEN, AND PELVIS WITH CONTRAST TECHNIQUE: Multidetector CT imaging of the chest, abdomen and pelvis was performed following the standard protocol during bolus administration of intravenous contrast. CONTRAST:  169mL OMNIPAQUE IOHEXOL 300 MG/ML  SOLN COMPARISON:  Multiple exams, including CT abdomen from 01/04/2020 and CT chest from 11/16/2019 FINDINGS: CT CHEST FINDINGS Cardiovascular: Moderate to prominent cardiomegaly. Atherosclerotic calcification of the aortic arch. Enlarged main pulmonary artery indicating pulmonary arterial hypertension. Prominent left upper extremity venous structures, favoring left upper extremity av fistula. Large pericardial effusion shown on the CT abdomen from 01/04/2020 has resolved. Mediastinum/Nodes: Scattered small level V lymph nodes in the lower neck. No pathologic thoracic adenopathy. Gas-filled upper esophagus. Lungs/Pleura: Large right and small left pleural effusions, with a notable grade passive atelectasis in the right lung. No appreciable enhancement along the pleural margins to differentiate between exudative in transudative etiology. Bilateral hazy alveolar opacities in both lungs suspicious for edema. Atelectasis and airway plugging in the superior segment left lower lobe with a possible cavitary process in the bandlike density in the left lower lobe superiorly as shown on image 73/4. There is some air trapping in the upper  portion of the superior segment left lower lobe. Musculoskeletal: Degenerative glenohumeral arthropathy, right greater than left. Osteoid and early callus formation along the multiple bilateral anterior rib fractures related to prior CPR. Nondisplaced healing transverse sternal fractures are present in the upper mid, and lower sternal body as shown on image 134/6. CT ABDOMEN PELVIS FINDINGS Hepatobiliary: The liver is initially imaged prior to portal vein opacification. Small gallstone in the gallbladder. Mild gallbladder wall thickening. Difficult to exclude early cirrhosis based on hepatic morphology. Pancreas: Unremarkable Spleen: Unremarkable Adrenals/Urinary Tract: The adrenal glands appear normal. Innumerable renal cysts of varying complexity, without enlargement of the kidneys, favoring acquired cystic disease of dialysis. Stomach/Bowel: The stomach is mildly distended with food material. Peg tube noted. No dilated small or large bowel identified. Vascular/Lymphatic: Mild aortoiliac atherosclerotic vascular disease. Scattered small likely reactive periaortic lymph nodes. Right external iliac node measuring 0.8 cm in short axis on image 100/3 has a fatty hilum. No overtly pathologic adenopathy is identified. Patent portal vein and SMV. Patent splenic vein. Reproductive: Unremarkable Other: Scattered ascites in the abdomen or pelvis. Musculoskeletal: Indirect left inguinal hernia contains adipose tissue and fluid. 3 mm degenerative retrolisthesis of L5-S1 with associated degenerative disc disease. Notably reduced size of the psoas muscle collections IMPRESSION: 1. Large right and small left pleural effusions with passive atelectasis in the right lung. 2. Bilateral hazy alveolar opacities in both lungs suspicious for edema. 3. Atelectasis and airway plugging in the superior segment left lower lobe with a possible cavitary process in the bandlike density in the left lower lobe superiorly. 4. Moderate to prominent  cardiomegaly. 5. Enlarged main pulmonary artery indicating pulmonary arterial hypertension. 6. Scattered ascites in the abdomen or pelvis. 7. Innumerable renal cysts of varying complexity, favoring acquired cystic disease of dialysis. 8. Healing transverse sternal fractures. 9. Cholelithiasis. 10. Indirect left inguinal hernia contains adipose tissue and fluid. 11. Degenerative glenohumeral arthropathy, right greater than left. 12. Prominent left upper extremity venous structures, favoring left upper extremity av fistula. 13. Notably reduced size of the psoas muscle densities shown on the 01/04/2020 exam, likely reflecting resolving psoas muscle hematomas. Aortic Atherosclerosis (ICD10-I70.0). Electronically Signed   By: Van Clines M.D.   On: 02/20/2020 20:13   DG CHEST PORT 1 VIEW  Result Date: 02/24/2020 CLINICAL  DATA:  Follow-up pleural effusion EXAM: PORTABLE CHEST 1 VIEW COMPARISON:  02/21/2020 FINDINGS: Cardiac shadow is enlarged but stable. Right basilar atelectasis and effusion are again seen and slightly increased when compared with the prior exam. Calcified granuloma is noted in the right lung stable from the prior study. No bony abnormality is seen. IMPRESSION: Slight increase in right-sided pleural effusion and basilar atelectasis. Electronically Signed   By: Inez Catalina M.D.   On: 02/24/2020 19:18   DG CHEST PORT 1 VIEW  Result Date: 02/20/2020 CLINICAL DATA:  Lethargy. Decreased blood pressure. Shortness of breath. EXAM: PORTABLE CHEST 1 VIEW COMPARISON:  02/12/2020 FINDINGS: Midline trachea. Cardiomegaly accentuated by AP portable technique. Small right pleural effusion. No pneumothorax. Moderate interstitial edema. Probable right hemidiaphragm elevation with adjacent right base airspace disease, similar. Mild left lower lobe airspace disease is not significantly changed. IMPRESSION: Moderate, increased congestive heart failure. Small right pleural effusion with right greater than left  base airspace disease, similar. Most likely atelectasis. Pneumonia at the right lung base cannot be excluded. Electronically Signed   By: Abigail Miyamoto M.D.   On: 02/20/2020 16:26   DG CHEST PORT 1 VIEW  Result Date: 02/12/2020 CLINICAL DATA:  Cough and shortness of breath. EXAM: PORTABLE CHEST 1 VIEW COMPARISON:  02/08/2020 and prior studies FINDINGS: Cardiomegaly, bilateral interstitial/airspace opacities and RIGHT LOWER lung consolidation/atelectasis again noted. Bilateral pleural effusions are unchanged. There is no evidence of pneumothorax. IMPRESSION: Unchanged chest radiograph with bilateral interstitial/airspace opacities, RIGHT LOWER lung consolidation/atelectasis and pleural effusions. Electronically Signed   By: Margarette Canada M.D.   On: 02/12/2020 09:34   DG CHEST PORT 1 VIEW  Result Date: 02/08/2020 CLINICAL DATA:  54 year old male with history of shortness of breath. EXAM: PORTABLE CHEST 1 VIEW COMPARISON:  Chest x-ray 02/05/2020. FINDINGS: Peripheral line in the right hemithorax favored to represent skin fold artifact. Elevation of the right hemidiaphragm. Bibasilar opacities (right greater than left), which may reflect areas of atelectasis and/or consolidation. Cephalization of the pulmonary vasculature with ill-defined opacities and areas of interstitial prominence throughout the lungs bilaterally. Moderate cardiomegaly. Upper mediastinal contours are within normal limits. Aortic atherosclerosis. IMPRESSION: 1. Appearance the chest suggests congestive heart failure, as above. 2. Bibasilar areas of atelectasis and/or consolidation. 3. Probable skin fold artifact projecting over the right hemithorax laterally. Follow-up standing PA and lateral chest radiograph is recommended to ensure the resolution of this finding and exclude pneumothorax. Electronically Signed   By: Vinnie Langton M.D.   On: 02/08/2020 12:29   DG Swallowing Func-Speech Pathology  Result Date: 02/10/2020 Objective  Swallowing Evaluation: Type of Study: MBS-Modified Barium Swallow Study  Patient Details Name: [name] [name] MRN: [id number] Date of Birth: March 08, 1966 Today's Date: 02/10/2020 Time: No data recorded-No data recorded No data recorded Past Medical History: Past Medical History: Diagnosis Date . Abdominal distension 08/10/2018 . Acute on chronic respiratory failure with hypoxia ([place])  . Acute on chronic systolic and diastolic heart failure, NYHA class 4 ([place])  . Acute pulmonary embolism without acute cor pulmonale (HCC)  . Alcohol abuse 11/26/2010  Qualifier: Diagnosis of  By: [name] MD, Pershing Proud . Anemia due to blood loss, acute 07/15/2012 . CHF (congestive heart failure) ([place])  . Cholecystitis 04/18/2014 . Chronic atrial fibrillation ([place])  . Cigarette smoker   has currently quit . Cocaine abuse ([place]) 11/26/2010  Qualifier: Diagnosis of  By: [name] MD, Benjamine Mola  has been over a year . Combined congestive systolic and diastolic heart failure ([place]) 04/18/2014  A. Echo 8/13:  Severe LVH, EF 40-45%, inferoposterior akinesis, grade 2 diastolic dysfunction, moderate LAE, mild RVE, mildly reduced RVSF, mild RAE; cannot rule out R atrial mass-suggest TEE or cardiac MRI  //  B. Echo 3/17: Mild LVH, EF 25-30%, diffuse HK, grade 2 diastolic dysfunction, mild MR, severe LAE,  moderately reduced RVSF, severe RAE, mild TR, moderate PI, PASP 55 mmHg   . Dyspnea  . End stage renal disease on dialysis ([place])  . ESRD (end stage renal disease) on dialysis [facility])   MWF- [place] (05/18/2017) . Hemodialysis patient ([place])   M,W,F . Hernia, inguinal, right 08/05/2016 . Hypertension  . 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 Hypocalcemia 07/15/2012 . Medical non-compliance  . Non-ischemic cardiomyopathy ([place])   A. R/L HC 3/17: Normal coronary arteries, moderate pulmonary hypertension (PASP 65 mmHg), elevated LV  filling pressures (LVEDP 45 mmHg)  . NSVT (nonsustained ventricular tachycardia) ([place]) 02/22/2016 . NSVT (nonsustained ventricular tachycardia) ([place]) 02/22/2016 . Pneumonia 11/12/2017 . Polysubstance abuse ([place])  . Prolonged Q-T interval on ECG 05/18/2017 . Prolonged QT interval 05/18/2017 . Restless leg syndrome  . Solitary pulmonary nodule 10/10/2015  See cxr 10/09/2015 - CT rec 10/10/2015 >>>  . Upper airway cough syndrome 10/09/2015  Off ACEi around 1st Oct 2016  - Sinus CT 10/10/2015 >>>   Past Surgical History: Past Surgical History: Procedure Laterality Date . [place] TRANSPOSITION  01/19/2013  Procedure: BASCILIC VEIN TRANSPOSITION;  Surgeon: Conrad Rodeo, MD;  Location: [place];  Service: Vascular;  Laterality: Left;  left 2nd stage basilic vein transposition . CARDIAC CATHETERIZATION N/A 02/25/2016  Procedure: Right/Left Heart Cath and Coronary Angiography;  Surgeon: Burnell Blanks, MD;  Location: [place] CV LAB;  Service: Cardiovascular;  Laterality: N/A; . INGUINAL HERNIA REPAIR Right 08/05/2016  Procedure: RIGHT INGUINAL HERNIA REPAIR WITH MESH;  Surgeon: Donnie Mesa, MD;  Location: [place];  Service: General;  Laterality: Right; . INSERTION OF DIALYSIS CATHETER  07/17/2012  Procedure: INSERTION OF DIALYSIS CATHETER;  Surgeon: Conrad Erda, MD;  Location: [place];  Service: Vascular;  Laterality: Right;  right internal jugular . INSERTION OF MESH Right 08/05/2016  Procedure: INSERTION OF MESH;  Surgeon: Donnie Mesa, MD;  Location: [place];  Service: General;  Laterality: Right; . IR GASTROSTOMY TUBE MOD SED  01/09/2020 . IR THORACENTESIS ASP PLEURAL SPACE W/IMG GUIDE  01/12/2020 . REVISION OF ARTERIOVENOUS GORETEX GRAFT Left [id number]/[id number]  Procedure: PLICATION OF ARTERIOVENOUS FISTULA LEFT ARM;  Surgeon: Elam Dutch, MD;  Location: [place];  Service: Vascular;  Laterality: Left; . REVISON OF ARTERIOVENOUS FISTULA Left 01/06/2017  Procedure: REVISON OF ARTERIOVENOUS FISTULA;  Surgeon: Conrad Neville, MD;   Location: [place];  Service: Vascular;  Laterality: Left; . REVISON OF ARTERIOVENOUS FISTULA Left 10/27/2018  Procedure: REVISION PLICATION OF ARTERIOVENOUS FISTULA  LEFT UPPER ARM;  Surgeon: Waynetta Sandy, MD;  Location: [place];  Service: Vascular;  Laterality: Left; . TRACHEOSTOMY TUBE PLACEMENT N/A 11/24/2019  Procedure: TRACHEOSTOMY;  Surgeon: Izora Gala, MD;  Location: [place];  Service: ENT;  Laterality: N/A; . UMBILICAL HERNIA REPAIR N/A 08/05/2016  Procedure: [place];  Surgeon: Donnie Mesa, MD;  Location: [place];  Service: General;  Laterality: N/A; . VENOGRAM N/A 08/09/2012  Procedure: VENOGRAM;  Surgeon: Conrad Westby, MD;  Location[facility] CATH LAB;  Service: Cardiovascular;  Laterality: N/A; HPI: See H&P  No data  recorded Assessment / Plan / Recommendation CHL IP CLINICAL IMPRESSIONS 02/10/2020 Clinical Impression Patient demonstrates a mild pharyngeal dysphagia characterized by inconsistent timing when triggering his swallow resulting in intermittent flash penetration. No episodes of aspiration noted. Patient's oral phase also appeared [facility]. Therefore, recommend patient upgrade to regular textures with thin liquids. Patient verbalize understanding and agreement. SLP Visit Diagnosis Dysphagia, pharyngeal phase (R13.13) Attention and concentration deficit following -- Frontal lobe and executive function deficit following -- Impact on safety and function Mild aspiration risk   CHL IP TREATMENT RECOMMENDATION 02/10/2020 Treatment Recommendations Therapy as outlined in treatment plan below   No flowsheet data found. CHL IP DIET RECOMMENDATION 02/10/2020 SLP Diet Recommendations Regular solids;Thin liquid Liquid Administration via Cup;Straw Medication Administration Whole meds with puree Compensations Minimize environmental distractions;Slow rate;Small sips/bites Postural Changes Seated upright at 90 degrees   CHL IP OTHER RECOMMENDATIONS 02/10/2020 Recommended Consults -- Oral Care  Recommendations Oral care BID Other Recommendations --   CHL IP FOLLOW UP RECOMMENDATIONS 02/10/2020 Follow up Recommendations Home health SLP   CHL IP FREQUENCY AND DURATION 02/10/2020 Speech Therapy Frequency (ACUTE ONLY) min 3x week Treatment Duration 2 weeks      CHL IP ORAL PHASE 02/10/2020 Oral Phase WFL Oral - Pudding Teaspoon -- Oral - Pudding Cup -- Oral - Honey Teaspoon -- Oral - Honey Cup -- Oral - Nectar Teaspoon -- Oral - Nectar Cup -- Oral - Nectar Straw -- Oral - Thin Teaspoon -- Oral - Thin Cup -- Oral - Thin Straw -- Oral - Puree -- Oral - Mech Soft -- Oral - Regular -- Oral - Multi-Consistency -- Oral - Pill -- Oral Phase - Comment --  CHL IP PHARYNGEAL PHASE 02/10/2020 Pharyngeal Phase Impaired Pharyngeal- Pudding Teaspoon -- Pharyngeal -- Pharyngeal- Pudding Cup -- Pharyngeal -- Pharyngeal- Honey Teaspoon -- Pharyngeal -- Pharyngeal- Honey Cup -- Pharyngeal -- Pharyngeal- Nectar Teaspoon -- Pharyngeal -- Pharyngeal- Nectar Cup -- Pharyngeal -- Pharyngeal- Nectar Straw -- Pharyngeal -- Pharyngeal- Thin Teaspoon WFL Pharyngeal -- Pharyngeal- Thin Cup WFL Pharyngeal -- Pharyngeal- Thin Straw Delayed swallow initiation-pyriform sinuses;Penetration/Aspiration during swallow Pharyngeal Material enters airway, remains ABOVE vocal cords then ejected out Pharyngeal- Puree -- Pharyngeal -- Pharyngeal- Mechanical Soft -- Pharyngeal -- Pharyngeal- Regular -- Pharyngeal -- Pharyngeal- Multi-consistency -- Pharyngeal -- Pharyngeal- Pill -- Pharyngeal -- Pharyngeal Comment --  CHL IP CERVICAL ESOPHAGEAL PHASE 02/10/2020 Cervical Esophageal Phase WFL Pudding Teaspoon -- Pudding Cup -- Honey Teaspoon -- Honey Cup -- Nectar Teaspoon -- Nectar Cup -- Nectar Straw -- Thin Teaspoon -- Thin Cup -- Thin Straw -- Puree -- Mechanical Soft -- Regular -- Multi-consistency -- Pill -- Cervical Esophageal Comment -- PAYNE, COURTNEY 02/10/2020, 9:41 AM  Weston Anna, MA, CCC-SLP [phone]             IR THORACENTESIS ASP PLEURAL  SPACE W/IMG GUIDE  Result Date: 02/21/2020 INDICATION: Shortness of breath with right pleural effusion. Request for therapeutic thoracentesis. EXAM: ULTRASOUND GUIDED RIGHT THORACENTESIS MEDICATIONS: 1% lidocaine 10 mL COMPLICATIONS: None immediate. PROCEDURE: An ultrasound guided thoracentesis was thoroughly discussed with the patient and questions answered. The benefits, risks, alternatives and complications were also discussed. The patient understands and wishes to proceed with the procedure. Written consent was obtained. Ultrasound was performed to localize and mark an adequate pocket of fluid in the right chest. The area was then prepped and draped in the normal sterile fashion. 1% Lidocaine was used for local anesthesia. Under ultrasound guidance a 6 Fr Safe-T-Centesis catheter was introduced. Thoracentesis was performed. The  catheter was removed and a dressing applied. FINDINGS: A total of approximately 450 mL of amber fluid was removed. IMPRESSION: Successful ultrasound guided right thoracentesis yielding 450 mL of pleural fluid. No pneumothorax on post-procedure chest x-ray. Read by: Gareth Eagle, PA-C Electronically Signed   By: Corrie Mckusick D.O.   On: 02/21/2020 16:13    Labs:  Basic Metabolic Panel: BMP Latest Ref Rng & Units 02/24/2020 02/22/2020 02/21/2020  Glucose 70 - 99 mg/dL 141(H) 126(H) 100(H)  BUN 6 - 20 mg/dL 33(H) 34(H) 64(H)  Creatinine 0.61 - 1.24 mg/dL 3.97(H) 4.13(H) 5.36(H)  Sodium 135 - 145 mmol/L 136 133(L) 133(L)  Potassium 3.5 - 5.1 mmol/L 2.8(L) 4.1 5.1  Chloride 98 - 111 mmol/L 95(L) 94(L) 91(L)  CO2 22 - 32 mmol/L 23 23 18(L)  Calcium 8.9 - 10.3 mg/dL 8.6(L) 8.5(L) 8.6(L)   CBC: Recent Labs  Lab 02/22/20 1319 02/24/20 1324  WBC 16.0* 11.2*  HGB 12.2* 12.8*  HCT 38.9* 42.9  MCV 89.2 92.1  PLT 167 194    CBG: No results for input(s): GLUCAP in the last 168 hours.  Brief HPI:   [name] [name] is a 54 y.o. male with history of ESRD- HD MWF, CHF, CAF, NICM,  who was originally admitted from his hemodialysis center on 11/16/19 with PEA arrest 3 hour into his HD session.  He underwent CPR with ACLS protocol and required emergent cricotomy due to failed intubation.  He was found to have A. fib with RVR was positive for H. influenzae and CT chest showed possible subsegmental PE right and left as well as a large left pleural effusion.  He was treated with CRRT started on IV heparin as well as multiple rounds of antibiotics.  He had recurrent V. fib arrest on 12/08 and hospital course significant for issues with volume overload as well as hypotension.  Dr. Rayann Heman with recommended supportive care as patient not candidate for advanced CHF therapies or ICD due to history of substance abuse and poor overall prognosis.  Cricotomy was converted to tracheostomy and he was discharged to [facility] on 12/23/2019 for vent wean and medical management.  Gastrostomy tube was placed on 01/27 by Dr. Shelton Silvas.  Colchicine was added due to pericardial effusion and repeat echo showed no evidence of tamponade.  He did develop neck edema due to circumferential mucous cell thickening likely due to to pharyngitis and also was found to have large layering bilateral pleural effusions right greater than left.  He was treated with course of Augmentin and steroids and underwent thoracocentesis of 250 cc of amber fluid from right lung on 01/28.  He has had issues with abdominal distention with high residuals and was started on trickle feeds and slowly advanced to 40 cc/h.  He tolerated extubation and was decannulated without difficulty on 02/17.  He had issues with anxiety, cognitive deficits, dysphagia--remained n.p.o. as well as debility due to ABI with multiple medical issues.  Therapy was ongoing and CIR was recommended due to functional decline  Hospital Course: [name] [name] was admitted to rehab 02/03/2020 for inpatient therapies to consist of PT, ST and OT at least three hours five days a week. Past  admission physiatrist, therapy team and rehab RN have worked together to provide customized collaborative inpatient rehab.  Speech therapy has followed for dysphagia treatment and he was started on p.o. with diet advanced to regular textures.  Nutritional supplements have been offered between meals for protein calorie malnutrition and to promote wound healing.  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  Vascular was consulted for input and recommended continuing with dressing changes with gauze between each dose to keep area dry.  Darco shoes were ordered for offloading.  He did report increase in pain in his feet with increase in activity.  Gabapentin as well as tramadol were added for pain management.  His blood pressures and heart rate were monitored on TID basis and metoprolol was discontinued due to low blood pressures.  He continues amiodarone for rate control and is on midodrine for blood pressure support.  Hemodialysis was changed to TTS to help with transportation issues.  On 03/08, patient was reported to have had poor po intake for 24-48 hours, had significant abdominal distension with discomfort, had increase in hypoxia with DOE and lethargy. Labs done revealed leukocytosis with WBC-15.8 and Chest x-ray showed moderate increase in congestive heart failure with question of pneumonia right base.    Hospitalist was consulted for input and recommended full work up. He was started on Zosyn empirically and completed 5 day course treatment. Marland Kitchen  BMET showed hyperkalemia with potassium at 7.3.   CT chest and abdomen was ordered for work-up and showed large right and small left pleural effusions with bilateral hazy alveolar opacities suspicious for edema and scattered ascites in abdomen or pelvis. He was also dialyzed daily for 6 days to help manage fluid overload and to address electrolyte abnormalities.  Hypokalemia of 3/12 was supplemented with 4.0K bath.     Interventional radiology was consulted and he underwent thoracocentesis of 450 cc of amber fluid from right lung on 03/09 and follow-up chest x-ray was negative for pneumothorax.  Lethargy has resolved and mentation is back to baseline.  Leucocytosis is resolving and he has been afebrile. His p.o. intake has improved and abdominal distention has resolved.  Low dose Lyrica added due to neuropathy andTramadol was resumed on prn basis to help with pain management. He is tolerating this without side effects.  Peripheral edema has greatly improved and weight is 141.5 lbs at discharge. He continues to requires supplemental oxygen due to hypoxia with activity.  He has made good gains during his rehab stay and is currently at min assist to supervision level.  He will continue to receive follow up HHPT, Melbeta, HHST, CNA and SN by [facility] after discharge   Rehab course: During patient's stay in rehab weekly team conferences were held to monitor patient's progress, set goals and discuss barriers to discharge. At admission, patient required min assist with basic ADL task and mod assist with mobility. He demonstrated moderate cognitive impairments affecting attention, problem-solving and recall.  He also had inconsistent level of arousal with impulsivity and low frustration tolerance.  He showed overt signs or symptoms of aspiration requiring max cues for safety with intake. He has had improvement in activity tolerance, balance, postural control as well as ability to compensate for deficits. He is able to complete ADL tasks with supervision.  He requires supervision for transfers and to ambulate 21' with RW.  He is able to utilize safe swallow strategies independently and requires min verbal cues for increased vocal intensity to compensate for hoarse vocal quality.  He requires min to mod verbal cues to complete function and familiar tasks safely.  Family education was completed with his father regarding all  aspects of care and safety  Disposition: Home  Diet: Renal diet. 1200 cc FR.   Special Instructions: 1.  Cleanse  around PEG site with soap and water.  Pat dry keep a dry dressing.  Change daily. 2.  Cleanse toes with normal saline.  Pat dry.  Place dry dressing between each toes.  Wear offloading shoes when ambulating 3.  Continue to use 2 L oxygen with activity.  May remove briefly at rest.   Allergies as of 02/27/2020      Reactions   Losartan Cough      Medication List    STOP taking these medications   acetaminophen 650 MG suppository Commonly known as: TYLENOL Replaced by: acetaminophen 325 MG tablet   calcium acetate 667 MG capsule Commonly known as: PHOSLO   clonazePAM 1 MG tablet Commonly known as: KLONOPIN   Darbepoetin Alfa 100 MCG/0.5ML Sosy injection Commonly known as: ARANESP   docusate 50 MG/5ML liquid Commonly known as: COLACE Replaced by: docusate sodium 100 MG capsule   feeding supplement (VITAL 1.5 CAL) Liqd   insulin aspart 100 UNIT/ML injection Commonly known as: novoLOG   nutrition supplement (JUVEN) Pack   oxyCODONE 5 MG/5ML solution Commonly known as: ROXICODONE   pantoprazole sodium 40 mg/20 mL Pack Commonly known as: PROTONIX   QUEtiapine 25 MG tablet Commonly known as: SEROQUEL     TAKE these medications   acetaminophen 325 MG tablet Commonly known as: TYLENOL Take 1-2 tablets (325-650 mg total) by mouth every 4 (four) hours as needed for mild pain. Replaces: acetaminophen 650 MG suppository   amiodarone 200 MG tablet Commonly known as: PACERONE Take 1 tablet (200 mg total) by mouth daily. What changed: how to take this   apixaban 2.5 MG Tabs tablet Commonly known as: ELIQUIS Take 1 tablet (2.5 mg total) by mouth 2 (two) times daily. What changed:   medication strength  how much to take  how to take this   benztropine 1 MG tablet Commonly known as: COGENTIN Take 1 tablet (1 mg total) by mouth 2 (two) times daily.    colchicine 0.6 MG tablet Take 1 tablet (0.6 mg total) by mouth daily.   dextromethorphan-guaiFENesin 30-600 MG 12hr tablet Commonly known as: MUCINEX DM Take 1 tablet by mouth 2 (two) times daily.   docusate sodium 100 MG capsule Commonly known as: Colace Take 1 capsule (100 mg total) by mouth daily. Replaces: docusate 50 MG/5ML liquid   feeding supplement (PRO-STAT SUGAR FREE 64) Liqd Take 30 mLs by mouth 2 (two) times daily. What changed:   how to take this  when to take this   folic acid 1 MG tablet Commonly known as: FOLVITE Take 1 tablet (1 mg total) by mouth daily.   free water Soln Place 100 mLs into feeding tube 2 (two) times daily. Use filtered or bottled water (No distilled water)   levothyroxine 25 MCG tablet Commonly known as: SYNTHROID Take 1 tablet (25 mcg total) by mouth daily at 6 (six) AM.   Melatonin 3 MG Tabs Take 1 tablet (3 mg total) by mouth at bedtime. To help with sleep   midodrine 10 MG tablet Commonly known as: PROAMATINE Take 1 tablet (10 mg total) by mouth 3 (three) times daily with meals. What changed: how to take this   multivitamin Tabs tablet Take 1 tablet by mouth at bedtime.   predniSONE 10 MG tablet Commonly known as: DELTASONE Take 1 tablet (10 mg total) by mouth daily with breakfast.   pregabalin 50 MG capsule Commonly known as: LYRICA Take 1 capsule (50 mg total) by mouth at bedtime.   prochlorperazine 5  MG tablet Commonly known as: COMPAZINE Take 1 tablet (5 mg total) by mouth every 6 (six) hours as needed for nausea or vomiting. What changed:   how to take this  reasons to take this   senna-docusate 8.6-50 MG tablet Commonly known as: Senokot-S Take 1 tablet by mouth at bedtime.   thiamine 100 MG tablet Take 1 tablet (100 mg total) by mouth daily.   traMADol 50 MG tablet--28 pills. Commonly known as: ULTRAM Take 1 tablet (50 mg total) by mouth every 6 (six) hours as needed for moderate pain or severe pain.    vitamin C 250 MG tablet Commonly known as: ASCORBIC ACID Take 1 tablet (250 mg total) by mouth 2 (two) times daily.      Follow-up Information    Meredith Staggers, MD Follow up.   Specialty: Physical Medicine and Rehabilitation Why: Office will call you with follow up appointment Contact information: [address] [place] [address] [phone]        Greggory Keen, MD Follow up.   Specialties: Interventional Radiology, Radiology Why: Call for PEG removal  Contact information: [place] [place] [address] [phone]        Dixie Dials, MD. Call.   Specialty: Cardiology Why: for follow up --A fib/cardiac issues Contact information: [place] [address] [phone]        Elam Dutch, MD Follow up.   Specialties: Vascular Surgery, Cardiology Contact information: [address] [place] [address] [phone]           Signed: Bary Leriche 02/28/2020, 3:44 PM

## 2020-02-27 NOTE — Telephone Encounter (Signed)
Returned call to [employer]. States patient is coming out of Rehab and will be receiving [place] services. Drew requests name of Attending. Gave today's Attending, Dr. Evette Doffing. Hubbard Hartshorn, BSN, RN-BC

## 2020-02-27 NOTE — Discharge Instructions (Signed)
Inpatient Rehab Discharge Instructions  [name] [name] Discharge date and time: 02/27/20    Activities/Precautions/ Functional Status: Activity: no lifting, driving, or strenuous exercise till cleared by MD. [place] DMV law does not allow you to drive for 6 months after cardiac arrest.  Diet: renal diet--limit fluids to 5 cups a day.  Wound Care: Cleanse toes with normal saline. Pat dry and keep a dressing between the toes. Use off loading shoes when up/walking.   ---wash around PEG site with soap and water. Pat dry. Keep dry dressing under flange. Drainage will resolve. Contact Dr. Annamaria Boots if you develop any problems with your incision/wound--redness, swelling, increase in pain, drainage or if you develop fever or chills.    Functional status:  ___ No restrictions     ___ Walk up steps independently _X__ 24/7 supervision/assistance   ___ Walk up steps with assistance ___ Intermittent supervision/assistance  ___ Bathe/dress independently ___ Walk with walker     ___ Bathe/dress with assistance ___ Walk Independently    ___ Shower independently ___ Walk with assistance    _X__ Shower with assistance _X__ No alcohol     ___ Return to work/school ________  Special Instructions: 1. Need to wear oxygen at all times--can take off for a few minutes when at rest. Continue to use the flutter valve. Remember to limit your fluid intake.    COMMUNITY REFERRALS UPON DISCHARGE:   Home Health:   PT     OT     ST    RN     Aide                Agency: [facility] Phone: [phone]  *Please expect follow-up within two days of your discharge to schedule your home visit. If you have not received follow-up, be sure to contact the branch directly.    Medical Equipment/Items Ordered: wheelchair, rolling walker                                                 Agency/Supplier: [facility] PATIENT/FAMILY: Be sure to follow-up with [facility]  Transportation to complete phone assessment [phone]     My questions have been answered and I understand these instructions. I will adhere to these goals and the provided educational materials after my discharge from the hospital.  Patient/Caregiver Signature _______________________________ Date __________  Clinician Signature _______________________________________ Date __________  Please bring this form and your medication list with you to all your follow-up doctor's appointments.

## 2020-02-27 NOTE — Progress Notes (Signed)
[facility] Progress Note   Dialysis Orders: MWF at [facility], 4:15hr, EDW 74.5kg, 2K/2.5Ca, AVF, heparin 5K - Calcitriol 0.56mcg PO q HD. No ESA or venofer  Assessment/Plan: 1. Voloverload/ pulm edema:CXR 2/24and 2/28withpersistent pulm edema. CT 3/8 showed large right and small left pleural effusions with edema.Thoracentesis on 3/9 with 450 cc fluid removed.Soft BP limiting UF with HD,has been getting serial HD for volume this week and tolerated CXR post HD 3/12 showed slight increase in right sided pleural effusion with post HD wt 60.7 - weight today 64.3 - ok to wait until Tuesday for new outpt schedule - Midodrine for BP support 2 Deconditioning:S/p prolonged [facility] admit (12/2-12/23/19) s/p cardiac arrest, then [facility] admit until 2/21. Now CIR  3. Lethargy/ AMS: comes and goes 4.ESRD:Previously MWF schedule but changing to TTS at discharge.K low on Friday due to serial HD last week  5. Anemia:Hgb 12s no ESA indicated.  6. Secondary hyperparathyroidism:Ca/P ok No binders/VDRA.- will follow as outpt  7. Hx cardiac arrest12/2020 8. Hx PE: On Eliquis 9. A-fib: On Eliquis + amiodarone/ 10. Hx pericardial effusion:colchicine +prednisone 10mg  QD. 11. HFrEF (<20%): volume management as above 12. 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 On gabapentin 100mg  TID which is an appropriate renal dose, caution using higher doses. 14.Constipation:large stool burdern on KUB 3/5. No abdominal pain at present. Recommendavoidingmagnesium containing products as able in ESRD patients.  [name], PA-C [facility]per [phone] 02/27/2020,1:11 PM  LOS: 24 days   Subjective:   For d/c today. D/w Father who will be taking pt to dialysis at [place] and his wife to dialysis at [facility] - same day same ship.  [name] [name] doesn't remember me.  Weight this am 64.3 kg up almost 4 kg  since 3/12.   Objective Vitals:   02/27/20 0500 02/27/20 0935 02/27/20 1000 02/27/20 1015  BP: (!) 141/103 (!) 141/114 (!) 131/107   Pulse: 73 72 82   Resp:      Temp:      TempSrc:      SpO2:    93%  Weight:      Height:       Physical Exam General: NAD Heart: RRR Lungs: dim bases Abdomen: soft NT PEG in place  Extremities: no sig LE edema Dialysis Access:  Right AVF + bruit   Additional Objective Labs: Basic Metabolic Panel: Recent Labs  Lab 02/21/20 1322 02/22/20 1319 02/24/20 1324  NA 133* 133* 136  K 5.1 4.1 2.8*  CL 91* 94* 95*  CO2 18* 23 23  GLUCOSE 100* 126* 141*  BUN 64* 34* 33*  CREATININE 5.36* 4.13* 3.97*  CALCIUM 8.6* 8.5* 8.6*  PHOS 6.3* 5.3* 3.7   Liver Function Tests: Recent Labs  Lab 02/21/20 1322 02/22/20 1319 02/24/20 1324  ALBUMIN 3.6 3.0* 2.9*   No results for input(s): LIPASE, AMYLASE in the last 168 hours. CBC: Recent Labs  Lab 02/21/20 0532 02/21/20 0532 02/21/20 1322 02/22/20 1319 02/24/20 1324  WBC 15.2*   < > 14.2* 16.0* 11.2*  NEUTROABS 13.2*  --   --   --   --   HGB 12.7*   < > 12.7* 12.2* 12.8*  HCT 40.8   < > 40.8 38.9* 42.9  MCV 88.5  --  88.7 89.2 92.1  PLT 219   < > 218 167 194   < > = values in this interval not displayed.   Blood Culture  Component Value Date/Time   SDES BLOOD RIGHT HAND 12/17/2019 1520   SPECREQUEST  12/17/2019 1520    BOTTLES DRAWN AEROBIC ONLY Blood Culture results may not be optimal due to an inadequate volume of blood received in culture bottles   CULT  12/17/2019 1520    NO GROWTH 5 DAYS Performed at [facility], [place] [address], [place], [place] [address]    REPTSTATUS 12/22/2019 FINAL 12/17/2019 1520    Cardiac Enzymes: No results for input(s): CKTOTAL, CKMB, CKMBINDEX, TROPONINI in the last 168 hours. CBG: Recent Labs  Lab 02/20/20 1601 02/20/20 1830  GLUCAP 99 84   Iron Studies: No results for input(s): IRON, TIBC, TRANSFERRIN, FERRITIN in the last 72  hours. Lab Results  Component Value Date   INR 1.6 (H) 11/16/2019   INR 1.5 (H) 11/16/2019   INR 1.36 10/27/2018   Studies/Results: No results found. Medications:  . amiodarone  200 mg Oral Daily  . apixaban  2.5 mg Oral BID  . ascorbic acid  500 mg Oral BID  . benztropine  1 mg Oral BID  . Chlorhexidine Gluconate Cloth  6 each Topical Q0600  . colchicine  0.6 mg Oral Daily  . dextromethorphan-guaiFENesin  1 tablet Oral BID  . docusate  100 mg Oral TID  . feeding supplement (PRO-STAT SUGAR FREE 64)  30 mL Oral BID  . folic acid  1 mg Oral Daily  . free water  100 mL Per Tube BID  . levothyroxine  25 mcg Oral Q0600  . mouth rinse  15 mL Mouth Rinse BID  . Melatonin  3 mg Oral QHS  . midodrine  10 mg Oral TID WC  . multivitamin  1 tablet Oral QHS  . predniSONE  10 mg Oral Q breakfast  . pregabalin  50 mg Oral QHS  . senna  2 tablet Oral QHS  . thiamine  100 mg Oral Daily

## 2020-02-27 NOTE — Progress Notes (Signed)
Speech Language Pathology Daily Session Note  Patient Details  Name: [name] [name] MRN: [id number] Date of Birth: 1966-11-18  Today's Date: 02/27/2020 SLP Individual Time: 0725-0805 SLP Individual Time Calculation (min): 40 min  Short Term Goals: Week 4: SLP Short Term Goal 1 (Week 4): STGs=LTGs due ELOS  Skilled Therapeutic Interventions: Skilled treatment session focused on cognitive goals. Upon arrival, patient was upright in the recliner and reported his feet hurt. Darco shoes were not on and SLP re-educated patient on importance of wearing shoes while transferring or ambulation. Patient verbalized understanding and required extra time and Min-Mod verbal cues for problem solving while donning shoes. Patient requested to perform basic self-care tasks at the sink and required Min verbal cues for problem solving and safety, especially while managing his nasal cannula. Patient left upright in recliner with alarm on and all needs within reach. Continue with current plan of care.      Pain Pain Assessment Pain Scale: 0-10 Pain Score: 0-No pain  Therapy/Group: Individual Therapy  Gesselle Fitzsimons 02/27/2020, 12:04 PM

## 2020-02-27 NOTE — Progress Notes (Signed)
Social Work Discharge Note   The overall goal for the admission was met for:   Discharge location: Yes. Pt returned to home with his parents.   Length of Stay: Yes. 24 days.  Discharge activity level: Yes. Supervision.  Home/community participation: Yes. Limited.  Services provided included: MD, RD, PT, OT, SLP, RN, CM, TR, Pharmacy, Neuropsych and SW  Financial Services: Medicare and Medicaid  Follow-up services arranged: Home Health: [facility] for PT/OT/ST/CNA/SN, DME: [employer] for oxygen, w/c and RW and Patient/Family request agency HH: [facility], DME: N/A  Comments (or additional information): Contact pt mother Peter Congo [phone] or father [name] [phone]  Patient/Family verbalized understanding of follow-up arrangements: Yes  Individual responsible for coordination of the follow-up plan: Pt will have assistance from father with transportation to/from dialysis. Pt and family encouraged to follow-up with Medicaid transportation.    Confirmed correct DME delivered: Rana Snare 02/27/2020    Rana Snare

## 2020-02-27 NOTE — Progress Notes (Signed)
Social Work Patient ID: [name] [name], male   DOB: 1966/03/18, 54 y.o.   MRN: [id number]   SW spoke with [facility]/Dialysis Coordinator [phone]) to discuss if pt will d/c today. Reports pt is on schedule to be dialyzed but will follow-up once there is more information from physician.   *SW received updates from PA-Pam Love, pt will d/c today and will not be dialyzed. SW left message for Terri Piedra, and  SW spoke with Drew/[facility] (p: [phone]/[phone]) to inform on above.   No further SW intervention.  Loralee Pacas, MSW, [place] Office: [phone] Cell: [phone] Fax: [phone]

## 2020-02-27 NOTE — Plan of Care (Signed)
  Problem: Consults Goal: RH GENERAL PATIENT EDUCATION Description: See Patient Education module for education specifics. Outcome: Progressing   Problem: RH BOWEL ELIMINATION Goal: RH STG MANAGE BOWEL WITH ASSISTANCE Description: STG Manage Bowel with Assistance. mod Outcome: Progressing   Problem: RH SKIN INTEGRITY Goal: RH STG SKIN FREE OF INFECTION/BREAKDOWN Description: Free of breakdown and infection with mod assist Outcome: Progressing Goal: RH STG ABLE TO PERFORM INCISION/WOUND CARE W/ASSISTANCE Description: STG Able To Perform Incision/Wound Care With Assistance. Mod Outcome: Progressing   Problem: RH SAFETY Goal: RH STG ADHERE TO SAFETY PRECAUTIONS W/ASSISTANCE/DEVICE Description: STG Adhere to Safety Precautions With Assistance/Device. Mod Outcome: Progressing   Problem: RH PAIN MANAGEMENT Goal: RH STG PAIN MANAGED AT OR BELOW PT'S PAIN GOAL Description: Less than 4 Outcome: Progressing

## 2020-02-27 NOTE — Progress Notes (Signed)
Patient up and down during intervals of shift,sitting on bedside and up to BR with staff, Noted fluctuation with VS( B/P and HR) patient states he doesn't need the po Apresoline but just need time to be left alone,reassurance and support provided with follow up of VS. Administered prn medications for c/o pain to bilateral feet and legs, attempts to assistance patient in repositioning resulting in agitativet attitude and verbal cussing, patient verbalized his concerns regarding his current medical condition," just want to get better". Reinforce some patient current medical plans and plans for discharging home today, He became more receptive and apologize for his behavior, Dressing removed by patient from his feet x2 and redressed, also from [place] site and reapplied, Some tan/brown drainage around G-Tube noted. Plan dialysis today Tele monitoring in place, Continue to monitor and assist.

## 2020-02-27 NOTE — Plan of Care (Signed)
  Problem: RH Balance Goal: LTG: Patient will maintain dynamic sitting balance (OT) Description: LTG:  Patient will maintain dynamic sitting balance with assistance during activities of daily living (OT) Outcome: Completed/Met Goal: LTG Patient will maintain dynamic standing with ADLs (OT) Description: LTG:  Patient will maintain dynamic standing balance with assist during activities of daily living (OT)  Outcome: Completed/Met   Problem: RH Eating Goal: LTG Patient will perform eating w/assist, cues/equip (OT) Description: LTG: Patient will perform eating with assist, with/without cues using equipment (OT) Outcome: Completed/Met   Problem: RH Grooming Goal: LTG Patient will perform grooming w/assist,cues/equip (OT) Description: LTG: Patient will perform grooming with assist, with/without cues using equipment (OT) Outcome: Completed/Met   Problem: RH Bathing Goal: LTG Patient will bathe all body parts with assist levels (OT) Description: LTG: Patient will bathe all body parts with assist levels (OT) Outcome: Completed/Met   Problem: RH Dressing Goal: LTG Patient will perform upper body dressing (OT) Description: LTG Patient will perform upper body dressing with assist, with/without cues (OT). Outcome: Completed/Met Goal: LTG Patient will perform lower body dressing w/assist (OT) Description: LTG: Patient will perform lower body dressing with assist, with/without cues in positioning using equipment (OT) Outcome: Completed/Met   Problem: RH Toileting Goal: LTG Patient will perform toileting task (3/3 steps) with assistance level (OT) Description: LTG: Patient will perform toileting task (3/3 steps) with assistance level (OT)  Outcome: Completed/Met   Problem: RH Toilet Transfers Goal: LTG Patient will perform toilet transfers w/assist (OT) Description: LTG: Patient will perform toilet transfers with assist, with/without cues using equipment (OT) Outcome: Completed/Met    Problem: RH Tub/Shower Transfers Goal: LTG Patient will perform tub/shower transfers w/assist (OT) Description: LTG: Patient will perform tub/shower transfers with assist, with/without cues using equipment (OT) Outcome: Completed/Met   Problem: RH Memory Goal: LTG Patient will demonstrate ability for day to day recall/carry over during activities of daily living with assistance level (OT) Description: LTG:  Patient will demonstrate ability for day to day recall/carry over during activities of daily living with assistance level (OT). Outcome: Completed/Met   Problem: RH Attention Goal: LTG Patient will demonstrate this level of attention during functional activites (OT) Description: LTG:  Patient will demonstrate this level of attention during functional activites  (OT) Outcome: Completed/Met   Problem: RH Awareness Goal: LTG: Patient will demonstrate awareness during functional activites type of (OT) Description: LTG: Patient will demonstrate awareness during functional activites type of (OT) Outcome: Completed/Met

## 2020-02-27 NOTE — Telephone Encounter (Signed)
Drew with [facility] requesting attending name to sign orders with Dr. Eileen Stanford, please call back.

## 2020-02-27 NOTE — Progress Notes (Signed)
Physical Therapy Discharge Summary  Patient Details  Name: [name] [name] MRN: [id number] Date of Birth: [birth year]  Today's Date: 02/27/2020 PT Individual Time: 0905-0959 PT Individual Time Calculation (min): 54 min    Patient has met 10 of 11 long term goals due to improved activity tolerance, improved balance, improved postural control, increased strength and ability to compensate for deficits.  Patient to discharge at an ambulatory level supervision with RW.   Patient's care partner is independent to provide the necessary physical and cognitive assistance at discharge.  Reasons goals not met: requires supervision for dynamic sitting balance 2/2 impaired balance, cognition & safety awareness  Recommendation:  Patient will benefit from ongoing skilled PT services in home health setting to continue to advance safe functional mobility, address ongoing impairments in endurance, balance, strength, gait, stair negotiation, cognition, and minimize fall risk.  Equipment: RW, w/c  Reasons for discharge: treatment goals met and discharge from hospital  Patient/family agrees with progress made and goals achieved: Yes   Skilled PT Treatment: Pt received in recliner & agreeable to tx. Pt asking to go upstairs to get his money with therapist redirecting him to scheduled PT session. Pt also asking about d/c and MD informing pt he will discuss this with him later. Pt transfers sit<>stand and ambulates short distance in room to w/c with RW & supervision. Transported pt to gym via w/c dependent assist for time management. Pt ambulates 100 ft + 100 ft with RW & close supervision with ongoing cuing for upright posture & forward gaze & to relax B shoulders as they're both elevated but poor return demo. Pt performs BLE Long arc quads 2 sets x 15 reps with cuing for increased ROM with focus on BLE strengthening. Pt then performs standing PNF with 1 kg weighted ball and min assist for standing balance. Vitals  checked, see below. Nurse made aware of pt's elevated diastolic BP but cleared pt to continue to participate in therapy to his tolerance. Pt propels w/c gym>room with BUE & supervision and returns to recliner in same manner noted prior. Pt left in recliner with chair alarm donned, call bell in reach.  No formal c/o pain during session.  PT Discharge Precautions/Restrictions Precautions Precautions: Fall Precaution Comments:  Darco shoes Restrictions Weight Bearing Restrictions: No   Vital Signs Therapy Vitals Pulse Rate: 72 BP: (!) 141/114 (R wrist) Patient Position (if appropriate): Sitting  Vision/Perception  No visual deficits at baseline. No apparent visual deficits. Perception Perception: Within Functional Limits   Cognition Overall Cognitive Status: Impaired/Different from baseline Arousal/Alertness: Awake/alert Orientation Level: Oriented X4 Memory: Impaired Memory Impairment: Decreased short term memory;Decreased recall of new information Decreased Short Term Memory: Verbal basic;Functional basic Awareness: Impaired Awareness Impairment: Emergent impairment Problem Solving: Impaired Problem Solving Impairment: Verbal basic;Functional basic Behaviors: Poor frustration tolerance Safety/Judgment: Impaired  Sensation Sensation Light Touch: (not formally tested, impaired by gross assessment) Additional Comments: limited sensation in bil feet. Coordination Gross Motor Movements are Fluid and Coordinated: No Fine Motor Movements are Fluid and Coordinated: No Heel Shin Test: limited by weakness in BLE  Motor  Motor Motor: Abnormal postural alignment and control Motor - Discharge Observations: generalized deconditioning/weakness   Mobility Bed Mobility Bed Mobility: Rolling Right;Rolling Left;Supine to Sit;Sit to Supine Rolling Right: Supervision/verbal cueing Rolling Left: Supervision/Verbal cueing Supine to Sit: Supervision/Verbal cueing Sit to Supine:  Supervision/Verbal cueing Transfers Transfers: Sit to Stand;Stand to Sit;Stand Pivot Transfers Sit to Stand: Supervision/Verbal cueing Stand to Sit: Supervision/Verbal cueing Stand Pivot Transfers: Supervision/Verbal cueing Stand  Pivot Transfer Details: Verbal cues for sequencing;Verbal cues for precautions/safety Transfer (Assistive device): Rolling walker  Locomotion  Gait Ambulation: Yes Gait Assistance: Supervision/Verbal cueing Gait Distance (Feet): (170 ft max during admission) Assistive device: Rolling walker Gait Assistance Details: cuing to ambulate within base of AD, forward vs downward gaze, relaxed shoulders Gait Gait: Yes Gait Pattern: Impaired Gait Pattern: Decreased stride length;Narrow base of support Gait velocity: decreased Stairs / Additional Locomotion Stairs: Yes Stairs Assistance: Minimal Assistance - Patient > 75% Stair Management Technique: Two rails Number of Stairs: 4 Wheelchair Mobility Wheelchair Mobility: Yes Wheelchair Assistance: Chartered loss adjuster: Both upper extremities Wheelchair Parts Management: Needs assistance Distance: 150 ft   Trunk/Postural Assessment  Cervical Assessment Cervical Assessment: Exceptions to WFL(forward head) Thoracic Assessment Thoracic Assessment: Exceptions to WFL(rounded shoulders) Lumbar Assessment Lumbar Assessment: Exceptions to WFL(posterior pelvic tilt) Postural Control Postural Control: Deficits on evaluation Righting Reactions: delayed Protective Responses: delayed   Balance Balance Balance Assessed: Yes Static Sitting Balance Static Sitting - Balance Support: No upper extremity supported Static Sitting - Level of Assistance: 6: Modified independent (Device/Increase time) Dynamic Sitting Balance Dynamic Sitting - Balance Support: No upper extremity supported Dynamic Sitting - Level of Assistance: 5: Stand by assistance  Static Standing Balance Static Standing -  Balance Support: During functional activity;Bilateral upper extremity supported Static Standing - Level of Assistance: 5: Stand by assistance Dynamic Standing Balance Dynamic Standing - Balance Support: During functional activity;Bilateral upper extremity supported Dynamic Standing - Level of Assistance: 5: Stand by assistance  Extremity Assessment  RUE Assessment RUE Assessment: Exceptions to [facility] General Strength Comments: generalized weakness 4/5 LUE Assessment LUE Assessment: Exceptions to [facility] General Strength Comments:  4/5 RLE Assessment RLE Assessment: Exceptions to [facility] General Strength Comments: hip flexion 3/5, knee extension 3/5 all tested in sitting LLE Assessment LLE Assessment: Exceptions to [facility] General Strength Comments: hip flexion 3-/5, knee extension 3-/5 all tested in sitting    [name] 02/27/2020, 3:56 PM

## 2020-02-27 NOTE — Progress Notes (Signed)
Patient was given D/C instructions by Algis Liming, Pa. All equipment and belongings have been packed and sent home with patient. Patient was sent home with oxygen. Patient teaching for wound care and peg tube dressings have been explained and demonstrated with patients father. Father was able to demonstrate and teach back instructions. No concern at this time. Amanda Cockayne, LPN

## 2020-02-27 NOTE — Progress Notes (Signed)
[facility] PHYSICAL MEDICINE & REHABILITATION PROGRESS NOTE  Subjective/Complaints: Asking if he can go home today. Feet still hurt but able to ambulate with PT.   ROS: Patient denies fever, rash, sore throat, blurred vision, nausea, vomiting, diarrhea, cough, shortness of breath or chest pain (although he was on 2L oxygen this morning), joint or back pain, headache, or mood change.     Objective: Vital Signs: Blood pressure (!) 131/107, pulse 82, temperature 98.8 F (37.1 C), temperature source Oral, resp. rate 19, height 5' 10.5" (1.791 m), weight 62.5 kg, SpO2 93 %. No results found. Recent Labs    02/24/20 1324  WBC 11.2*  HGB 12.8*  HCT 42.9  PLT 194   Recent Labs    02/24/20 1324  NA 136  K 2.8*  CL 95*  CO2 23  GLUCOSE 141*  BUN 33*  CREATININE 3.97*  CALCIUM 8.6*    Physical Exam: BP (!) 131/107   Pulse 82   Temp 98.8 F (37.1 C) (Oral)   Resp 19   Ht 5' 10.5" (1.791 m)   Wt 62.5 kg   SpO2 93%   BMI 19.49 kg/m  Constitutional: No distress . Vital signs reviewed. HEENT: EOMI, oral membranes moist Neck: supple Cardiovascular: RRR without murmur. No JVD    Respiratory/Chest: CTA Bilaterally without wheezes or rales. Normal effort but on 2L oxygen  GI/Abdomen: BS +, non-tender, non-distended, PEG Ext: no clubbing, cyanosis, or edema Skin:   B/L toes gangrenous per baseline Psych: appears in pain; perseverating on topic.  Musc: No edema in extremities.  No tenderness in extremities. Neurological: Ox2-3- knew month, not day Motor: 4- to /5 in all 4's with pain inhibition in both feet.  Assessment/Plan: 1. Functional deficits secondary to encephalopathy which require 3+ hours per day of interdisciplinary therapy in a comprehensive inpatient rehab setting.  Physiatrist is providing close team supervision and 24 hour management of active medical problems listed below.  Physiatrist and rehab team continue to assess barriers to discharge/monitor  patient progress toward functional and medical goals  Care Tool:  Bathing  Bathing activity did not occur: Refused Body parts bathed by patient: Right arm, Left arm, Chest, Abdomen, Front perineal area, Buttocks, Right upper leg, Left upper leg, Right lower leg, Left lower leg, Face   Body parts bathed by helper: Abdomen, Right lower leg, Left lower leg     Bathing assist Assist Level: Contact Guard/Touching assist     Upper Body Dressing/Undressing Upper body dressing Upper body dressing/undressing activity did not occur (including orthotics): Refused What is the patient wearing?: Pull over shirt    Upper body assist Assist Level: Supervision/Verbal cueing    Lower Body Dressing/Undressing Lower body dressing    Lower body dressing activity did not occur: Refused What is the patient wearing?: Pants     Lower body assist Assist for lower body dressing: Contact Guard/Touching assist     Toileting Toileting Toileting Activity did not occur (Clothing management and hygiene only): Refused  Toileting assist Assist for toileting: Minimal Assistance - Patient > 75%     Transfers Chair/bed transfer  Transfers assist     Chair/bed transfer assist level: Supervision/Verbal cueing     Locomotion Ambulation   Ambulation assist   Ambulation activity did not occur: N/A  Assist level: Supervision/Verbal cueing Assistive device: Walker-rolling Max distance: 130   Walk 10 feet activity   Assist     Assist level: Supervision/Verbal cueing Assistive device: Walker-rolling   Walk 50 feet activity  Assist    Assist level: Supervision/Verbal cueing Assistive device: Walker-rolling    Walk 150 feet activity   Assist Walk 150 feet activity did not occur: Safety/medical concerns(pt unable to exceed 178ft today due to limited endurance)  Assist level: Supervision/Verbal cueing Assistive device: Walker-rolling    Walk 10 feet on uneven surface   activity   Assist Walk 10 feet on uneven surfaces activity did not occur: Safety/medical concerns   Assist level: Supervision/Verbal cueing Assistive device: Aeronautical engineer Will patient use wheelchair at discharge?: Yes Type of Wheelchair: Manual Wheelchair activity did not occur: N/A  Wheelchair assist level: Supervision/Verbal cueing Max wheelchair distance: 150    Wheelchair 50 feet with 2 turns activity    Assist        Assist Level: Supervision/Verbal cueing   Wheelchair 150 feet activity     Assist     Assist Level: Supervision/Verbal cueing      Medical Problem List and Plan: 1.  Deficits with mobility, endurance, self-care secondary to encephalopathy with debility, PVD with gangrenous toes.  -Continue CIR therapies including PT, OT, and SLP   -pt with baseline cognitive deficits  - home today    -Patient to see me or Dr. Ranell Patrick in the office for transitional care encounter in 1-2 weeks.  2.  Antithrombotics: -PE/anticoagulation:  Pharmaceutical: Other (comment)--on low dose Eliquis             -antiplatelet therapy:  N/A 3. Pain Management: Oxycodone or tramadol prn.   2/24: Therapist notes that Mr. Waynick has been having hypersensitivity of his gangrenous toes. Will add low dose Gabapentin 100mg  TID to help with pain-- low dose due to ESRD.  2/28 continue gabapentin---seems to be tolerating so far  3/1: Pain is well controlled.   3/3: gabapentin increased slightly for foot/toe pain.  3/5  Foot/toe pain seems better controlled   3/7: well controlled.   3/10: complaining of pain this morning. Gabapentin has been discontinued due to sedative potential given patient's renal function. Can encourage Tramadol use; received this morning.   3/11: Pain appears better controlled this morning.   3/14- added Lyrica in place of gabapentin (for less sedation) 4. Mood: LCSW to follow for evaluation and support.    See #14              -antipsychotic agents: N/A   See #14 5. Neuropsych: This patient is not fully capable of making decisions on his own behalf. 6. Skin/Wound Care: Dry dressing to bilateral feet.              trach stoma closed. PEG site intact 7. Fluids/Electrolytes/Nutrition:   2/23 stopped TF  -monitor intake.   8. A fib/NSVT: On amiodarone and metoprolol for rate control.              Rate in 90's on 2/25   -isolated bradycardia yesterday   3/2--3/3: rates remain in 90's   -see #9.   3/6: rate in 80s  3/11: rate in 60s-70s.  3/13- Metoprolol to be d/c'd by Renal since BP soft and rates in 60s without meds.    Monitor with increased activity.  -intermittent SOB, CXR demonstrates CHF. Nephrology working on volume mgt with HD  3/15 HR controlled 9.  ESRD: HD MWF at the end of the day to help with activity tolerance.   -volume/weight mgt per nephrology,    3/3 weights stable  3/6: weight decreased  3/9: HD again  today at noon  3/11: Labs reviewed. Cr and K+ improved with dialysis.  3/13- K+ was 2.8- renal plans a 4.0 K+ bath- will recheck later today- still getting daily HD.   3/15: weight up 2kg from 3/13---for HD tomorrow at outpt center [facility] Weights   02/25/20 0542 02/26/20 0427 02/27/20 0345  Weight: 60.2 kg 61.7 kg 62.5 kg  10. Pericardial effusion: Has been on colchicine since 1/19 and has been weaned to prednisone 10 mg daily. Last echo 1/29 shows decrease to small effusion.  3/7: Yesterday evening he was complaining of increased shortness of breath and his breathing appeared more labored on examination yesterday. HD has not been able to remove as much fluid due to his hypotension. Gave 20mg  Lasix and his breathing is much improved today, appears comfortably. EKG obtained to assess qTC given Lasix administration, qTC is 432, much improved from prior.  3/8- Weight up 10 lbs in 1 day- will follow trend, since 10 lbs change unlikely.   3/9: Discussed with hospitalist Dr. Jerilee Hoh, she will  schedule for thoracentesis with IR today.   3/10: Yesterday patient underwent successful US guided righ thoracentesis which yielded 435mL of amber fluid. Post-procedure XR reveals no pneumothorax.   3/11-15: breathing more comfortable.    -oxygen removed today---93% on RA 11. Hypotension: Continue midodrine 10 mg tid.  BP's still soft 2/26, 3/1, 3/3. A little better 3/4  3/6: continue to be very soft.   3/8- BP 125/113- 120/94- BP doing better -con't regimen.  3/11: well controlled.   3/13- stopped metoprolol due to soft BPs 12. Dysphagia/PEG:   Eating fairly well now  2/26- had MBS ---upgraded to regular/thins  3/5: abdomen distended today with mild discomfort. has had two medium bm's over last 2 days.    -check kub given distention   -advised patient that he may have some discharge from around PEG. Change dressings as needed  3/6: likely having loose stools around constipated mass; KUB shows retained stool in bowel and questionable undigested food in stomach. Refused enema. Provided education about why he needs it but continues to refuse. Will order mag citrate to which he is agreeable.   3/7: refused mag citrate and enema yesterday. He is willing to take Senna HS and Docusate TID.   3/8- pt having dark brown/red bloody drainage around PEG_ like from hematoma- will have IR take a look at it today. LBM Saturday. Still refusing bowel meds. Not eating well, likely due to distension- refuses bowel meds, so have no way to fix distension.  3/10: Finally amenable to enema 3/9 and had medium sized BM yesterday.   3/15: abdominal pain and constipation better with more regular bm's.    -continue docusate and senna 13. Dry gangrene bilateral feet: Needs to wear shoes for protection. Dry dressing. Maintain adequate nutritional status.    -he is having ongoing pain in feet, right more than left.   -both feet cool, especially right  -wounds appear stable to improved but demonstrate ongoing gangrene as  well  3/2-4 -vascular following patient. May need toe amps eventually  -continue local care, pain mgt  3/15 surgery to follow up as outpt 14. Anxiety/depression: On Zoloft 25 mg/day.  Team support.  ECG with prolonged QTC 512  Continue melatonin  Klonopin increased  1 mg 3 times daily 15. Insomnia: Per father, he sleeps late at night at baseline.   -2/27 still having difficulties more often than not at night d/t confusion/agitation/anxiety   -resumed seroquel 25mg  qhs   -  QTC actually was DECREASED last week after giving seroquel the other day.   3/2 sleep generally improved   -has baseline insomnia   -pain and anxiety often inhibit sleep   -QTC further decreased on  EKG   16. Phlegm with slight cough: 3/7 started Mucinex. 17. Encephalopathy: CT Head obtained yesterday, personally reviewed and shows improvement from prior. Mental status improved today.      LOS: 24 days A FACE TO [place] 02/27/2020, 10:31 AM

## 2020-02-28 DIAGNOSIS — Z9981 Dependence on supplemental oxygen: Secondary | ICD-10-CM

## 2020-02-28 DIAGNOSIS — N2581 Secondary hyperparathyroidism of renal origin: Secondary | ICD-10-CM | POA: Diagnosis not present

## 2020-02-28 DIAGNOSIS — Z992 Dependence on renal dialysis: Secondary | ICD-10-CM | POA: Diagnosis not present

## 2020-02-28 DIAGNOSIS — N186 End stage renal disease: Secondary | ICD-10-CM | POA: Diagnosis not present

## 2020-02-28 DIAGNOSIS — D509 Iron deficiency anemia, unspecified: Secondary | ICD-10-CM | POA: Diagnosis not present

## 2020-02-28 DIAGNOSIS — J90 Pleural effusion, not elsewhere classified: Secondary | ICD-10-CM

## 2020-02-29 ENCOUNTER — Telehealth: Payer: Self-pay | Admitting: Registered Nurse

## 2020-02-29 NOTE — Telephone Encounter (Signed)
Transitional Care call Transitional Questions Answered by Ms. Peter Congo ( Mother)  Patient name: [name] [name] DOB: 15-Dec-1966 1. Are you/is patient experiencing any problems since coming home? No a. Are there any questions regarding any aspect of care? No 2. Are there any questions regarding medications administration/dosing? No a. Are meds being taken as prescribed? Yes b. "Patient should review meds with caller to confirm" Medication List Reviewed 3. Have there been any falls? No 4. Has Home Health been to the house and/or have they contacted you? Yes: [facility]. a. If not, have you tried to contact them? NA b. Can we help you contact them? NA 5. Are bowels and bladder emptying properly? Bowels are moving/ ESRD Tues/Thurs/ Sat a. Are there any unexpected incontinence issues? No b. If applicable, is patient following bowel/bladder programs? NA 6. Any fevers, problems with breathing, unexpected pain? No 7. Are there any skin problems or new areas of breakdown? No 8. Has the patient/family member arranged specialty MD follow up (ie cardiology/neurology/renal/surgical/etc.)?  Mr. And Mrs. Asare was instructed to call Dr. Oneida Alar to schedule an appointment, they verbalize understanding.  a. Can we help arrange? No 9. Does the patient need any other services or support that we can help arrange? No 10. Are caregivers following through as expected in assisting the patient? Yes 11. Has the patient quit smoking, drinking alcohol, or using drugs as recommended? (                        )  Appointment date/time 03/07/2020  arrival time 9:40 for 10:00 appointment with Dr. Ranell Patrick at [place]

## 2020-03-01 ENCOUNTER — Telehealth: Payer: Self-pay | Admitting: Internal Medicine

## 2020-03-01 DIAGNOSIS — N2581 Secondary hyperparathyroidism of renal origin: Secondary | ICD-10-CM | POA: Diagnosis not present

## 2020-03-01 DIAGNOSIS — Z992 Dependence on renal dialysis: Secondary | ICD-10-CM | POA: Diagnosis not present

## 2020-03-01 DIAGNOSIS — N186 End stage renal disease: Secondary | ICD-10-CM | POA: Diagnosis not present

## 2020-03-01 DIAGNOSIS — D509 Iron deficiency anemia, unspecified: Secondary | ICD-10-CM | POA: Diagnosis not present

## 2020-03-01 NOTE — Telephone Encounter (Signed)
Patient has not been seen at [facility] since Nov 2019. Spoke with today's Attending, Dr. Daryll Drown and Abigail Butts notified that as  patient has had significant changes in his medical condition since last visit we will be unable to give [facility] orders or referrals. Advised to contact Dr. Naaman Plummer office. Hubbard Hartshorn, BSN, RN-BC

## 2020-03-01 NOTE — Telephone Encounter (Signed)
Rec'd phone call from [facility] requesting VO for [facility] as the patient has just been D/C. RN Abigail Butts also requesting a Referral to a [place]. Please call back.

## 2020-03-03 DIAGNOSIS — Z992 Dependence on renal dialysis: Secondary | ICD-10-CM | POA: Diagnosis not present

## 2020-03-03 DIAGNOSIS — D509 Iron deficiency anemia, unspecified: Secondary | ICD-10-CM | POA: Diagnosis not present

## 2020-03-03 DIAGNOSIS — N186 End stage renal disease: Secondary | ICD-10-CM | POA: Diagnosis not present

## 2020-03-03 DIAGNOSIS — N2581 Secondary hyperparathyroidism of renal origin: Secondary | ICD-10-CM | POA: Diagnosis not present

## 2020-03-05 ENCOUNTER — Telehealth: Payer: Self-pay | Admitting: *Deleted

## 2020-03-05 ENCOUNTER — Telehealth: Payer: Self-pay

## 2020-03-05 NOTE — Telephone Encounter (Signed)
Pt's mother requesting to speak with a nurse about meds, pleases call back.

## 2020-03-05 NOTE — Telephone Encounter (Signed)
Patient left a message asking if Reesa Chew, PA could call back.  Patients wife is wondering if patient would be able to tolerate a portable oxygen machine? Patient discharged from hospital.. Assigned to Dr. Naaman Plummer.  First visit is with Dr. Ranell Patrick on 03/07/2020

## 2020-03-05 NOTE — Telephone Encounter (Signed)
Called pt's mother - stated pt was discharged from physical med and rehab; wants to know if pt should be taking a binder (calcium acetate) along with the "medicines on this list". Upon review of chart, pt was discharge on 3/15; discharge summary per Reesa Chew PA. Informed pt and pt's mother to call their office ( phone # given) to clarify medications.

## 2020-03-06 ENCOUNTER — Telehealth (HOSPITAL_COMMUNITY): Payer: Self-pay | Admitting: Physical Medicine and Rehabilitation

## 2020-03-06 DIAGNOSIS — D509 Iron deficiency anemia, unspecified: Secondary | ICD-10-CM | POA: Diagnosis not present

## 2020-03-06 DIAGNOSIS — N186 End stage renal disease: Secondary | ICD-10-CM | POA: Diagnosis not present

## 2020-03-06 DIAGNOSIS — N2581 Secondary hyperparathyroidism of renal origin: Secondary | ICD-10-CM | POA: Diagnosis not present

## 2020-03-06 DIAGNOSIS — Z992 Dependence on renal dialysis: Secondary | ICD-10-CM | POA: Diagnosis not present

## 2020-03-06 NOTE — Telephone Encounter (Signed)
Called patient's mother back. Informed her that patient was not on binders due to his poor intake. Nephrology will determine renal meds. 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 Do not want to wet it or debride it. Also advised her to set follow up with [facility]  who will continue to provide input on care.

## 2020-03-06 NOTE — Telephone Encounter (Signed)
I left message with patient's wife to discuss issue further, provided my call back number.

## 2020-03-07 ENCOUNTER — Encounter: Payer: Self-pay | Admitting: Physical Medicine and Rehabilitation

## 2020-03-07 ENCOUNTER — Other Ambulatory Visit: Payer: Self-pay

## 2020-03-07 ENCOUNTER — Encounter
Payer: Medicare Other | Attending: Physical Medicine and Rehabilitation | Admitting: Physical Medicine and Rehabilitation

## 2020-03-07 VITALS — BP 128/92 | HR 89 | Temp 97.8°F | Ht 69.0 in | Wt 141.0 lb

## 2020-03-07 DIAGNOSIS — R5381 Other malaise: Secondary | ICD-10-CM | POA: Diagnosis not present

## 2020-03-07 DIAGNOSIS — I96 Gangrene, not elsewhere classified: Secondary | ICD-10-CM | POA: Diagnosis not present

## 2020-03-07 MED ORDER — PREGABALIN 50 MG PO CAPS: 50.0000 mg | ORAL_CAPSULE | Freq: Every day | ORAL | 1 refills | Status: AC

## 2020-03-07 MED ORDER — TRAMADOL HCL 50 MG PO TABS: 50.0000 mg | ORAL_TABLET | Freq: Four times a day (QID) | ORAL | 0 refills | Status: AC | PRN

## 2020-03-07 NOTE — Progress Notes (Signed)
Subjective:    Patient ID: [name] [name], male    DOB: [birth year], 54 y.o.   MRN: [id number]  HPI  [name] [name] is a 54 year old man who presents for transitional care follow-up after CIR admission for debility.  He has not received any home therapy yet. He has a nurse that comes twice per week and performs wound care those days. His father performs wound care on other days. He has had caregiver training in the hospital and has been following the instructions he received, but needs he needs the input of an additional wound care nurse given the severity of his son's wounds.  [name] [name] has been ambulating around his home a few times per day in order to use the bathroom. He has pain in bilateral feet, similar to what he experienced in the hospital. He has been taking Tramadol 50mg  q6H, sometimes a little less frequently, but usually on this schedule. He took some Oxycodone that his mother had and his father asks if this is ok.   All other medications reviewed and he does not require additional refills. He has a PCP and his father has tried to schedule PCP follow-up; awaiting for a call back.   He has been breathing comfortably.  He has scheduled vascular surgery and IR follow-ups for removal of PEG tube.   Pain Inventory Average Pain 10 Pain Right Now 10 My pain is stabbing  In the last 24 hours, has pain interfered with the following? General activity 10 Relation with others 9 Enjoyment of life 10 What TIME of day is your pain at its worst? daytime Sleep (in general) Fair  Pain is worse with: unsure Pain improves with: medication Relief from Meds: 8  Mobility walk with assistance  Function disabled: date disabled .  Neuro/Psych trouble walking  Prior Studies Any changes since last visit?  no  Physicians involved in your care Any changes since last visit?  no   Family History  Problem Relation Age of Onset  . Hypertension Mother   . Diabetes Mother   . Renal  Disease Mother   . Hypertension Father    Social History   Socioeconomic History  . Marital status: Single    Spouse name: Not on file  . Number of children: Not on file  . Years of education: Not on file  . Highest education level: Not on file  Occupational History  . Not on file  Tobacco Use  . Smoking status: Former Smoker    Years: 30.00    Types: Cigarettes    Quit date: 10/20/2018    Years since quitting: 1.3  . Smokeless tobacco: Never Used  Substance and Sexual Activity  . Alcohol use: Yes    Alcohol/week: 4.0 standard drinks    Types: 4 Cans of beer per week  . Drug use: No    Types: Marijuana    Comment: 05/18/2017 "qd"  . Sexual activity: Not Currently  Other Topics Concern  . Not on file  Social History Narrative  . Not on file   Social Determinants of Health   Financial Resource Strain:   . Difficulty of Paying Living Expenses:   Food Insecurity:   . Worried About Charity fundraiser in the Last Year:   . Arboriculturist in the Last Year:   Transportation Needs:   . Film/video editor (Medical):   Marland Kitchen Lack of Transportation (Non-Medical):   Physical Activity:   . Days of Exercise per Week:   .  Minutes of Exercise per Session:   Stress:   . Feeling of Stress :   Social Connections:   . Frequency of Communication with Friends and Family:   . Frequency of Social Gatherings with Friends and Family:   . Attends Religious Services:   . Active Member of Clubs or Organizations:   . Attends Archivist Meetings:   Marland Kitchen Marital Status:    Past Surgical History:  Procedure Laterality Date  . [place] TRANSPOSITION  01/19/2013   Procedure: BASCILIC VEIN TRANSPOSITION;  Surgeon: Conrad Middlebush, MD;  Location: [facility];  Service: Vascular;  Laterality: Left;  left 2nd stage basilic vein transposition  . CARDIAC CATHETERIZATION N/A 02/25/2016   Procedure: Right/Left Heart Cath and Coronary Angiography;  Surgeon: Burnell Blanks, MD;  Location: [facility] CV LAB;  Service: Cardiovascular;  Laterality: N/A;  . INGUINAL HERNIA REPAIR Right 08/05/2016   Procedure: RIGHT INGUINAL HERNIA REPAIR WITH MESH;  Surgeon: Donnie Mesa, MD;  Location: [facility];  Service: General;  Laterality: Right;  . INSERTION OF DIALYSIS CATHETER  07/17/2012   Procedure: INSERTION OF DIALYSIS CATHETER;  Surgeon: Conrad Blencoe, MD;  Location: [facility];  Service: Vascular;  Laterality: Right;  right internal jugular  . INSERTION OF MESH Right 08/05/2016   Procedure: INSERTION OF MESH;  Surgeon: Donnie Mesa, MD;  Location: [facility];  Service: General;  Laterality: Right;  . IR GASTROSTOMY TUBE MOD SED  01/09/2020  . IR THORACENTESIS ASP PLEURAL SPACE W/IMG GUIDE  01/12/2020  . IR THORACENTESIS ASP PLEURAL SPACE W/IMG GUIDE  02/21/2020  . REVISION OF ARTERIOVENOUS GORETEX GRAFT Left 1/61/0960   Procedure: PLICATION OF ARTERIOVENOUS FISTULA LEFT ARM;  Surgeon: Elam Dutch, MD;  Location: [facility];  Service: Vascular;  Laterality: Left;  . REVISON OF ARTERIOVENOUS FISTULA Left 01/06/2017   Procedure: REVISON OF ARTERIOVENOUS FISTULA;  Surgeon: Conrad [place], MD;  Location: [facility];  Service: Vascular;  Laterality: Left;  . REVISON OF ARTERIOVENOUS FISTULA Left 10/27/2018   Procedure: REVISION PLICATION OF ARTERIOVENOUS FISTULA  LEFT UPPER ARM;  Surgeon: Waynetta Sandy, MD;  Location: [facility];  Service: Vascular;  Laterality: Left;  . TRACHEOSTOMY TUBE PLACEMENT N/A 11/24/2019   Procedure: TRACHEOSTOMY;  Surgeon: Izora Gala, MD;  Location: [facility];  Service: ENT;  Laterality: N/A;  . UMBILICAL HERNIA REPAIR N/A 08/05/2016   Procedure: [place];  Surgeon: Donnie Mesa, MD;  Location: [facility];  Service: General;  Laterality: N/A;  . VENOGRAM N/A 08/09/2012   Procedure: VENOGRAM;  Surgeon: Conrad Sunray, MD;  [facility] C[facility];  Service: Cardiovascular;  Laterality: N/A;   Past Medical History:  Diagnosis Date  . Abdominal distension 08/10/2018  .  Acute on chronic respiratory failure with hypoxia ([place])   . Acute on chronic systolic and diastolic heart failure, NYHA class 4 ([place])   . Acute pulmonary embolism without acute cor pulmonale (HCC)   . Alcohol abuse 11/26/2010   Qualifier: Diagnosis of  By: [name] MD, Pershing Proud  . Anemia due to blood loss, acute 07/15/2012  . CHF (congestive heart failure) ([place])   . Cholecystitis 04/18/2014  . Chronic atrial fibrillation ([place])   . Cigarette smoker    has currently quit  . Cocaine abuse ([place]) 11/26/2010   Qualifier: Diagnosis of  By: [name] MD, Benjamine Mola  has been over a year  . 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 Echo 8/13: Severe  LVH, EF 40-45%, inferoposterior akinesis, grade 2 diastolic dysfunction, moderate LAE, mild RVE, mildly reduced RVSF, mild RAE; cannot rule out R atrial mass-suggest TEE or cardiac MRI  //  B. Echo 3/17: Mild LVH, EF 25-30%, diffuse HK, grade 2 diastolic dysfunction, mild MR, severe LAE,  moderately reduced RVSF, severe RAE, mild TR, moderate PI, PASP 55 mmHg    . Dyspnea   . End stage renal disease on dialysis ([place])   . ESRD (end stage renal disease) on dialysis [facility])    MWF- [place] (05/18/2017)  . Hemodialysis patient ([place])    M,W,F  . Hernia, inguinal, right 08/05/2016  . Hypertension   . 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 Hypocalcemia 07/15/2012  . Medical non-compliance   . Non-ischemic cardiomyopathy ([place])    A. R/L HC 3/17: Normal coronary arteries, moderate pulmonary hypertension (PASP 65 mmHg), elevated LV filling pressures (LVEDP 45 mmHg)   . NSVT (nonsustained ventricular tachycardia) ([place]) 02/22/2016  . NSVT (nonsustained ventricular tachycardia) ([place]) 02/22/2016  . Pneumonia 11/12/2017  . Polysubstance abuse ([place])   . Prolonged Q-T interval on ECG 05/18/2017  .  Prolonged QT interval 05/18/2017  . Restless leg syndrome   . Solitary pulmonary nodule 10/10/2015   See cxr 10/09/2015 - CT rec 10/10/2015 >>>   . Upper airway cough syndrome 10/09/2015   Off ACEi around 1st Oct 2016  - Sinus CT 10/10/2015 >>>     BP (!) 128/92   Pulse 89   Temp 97.8 F (36.6 C)   Ht 5\' 9"  (1.753 m)   Wt 141 lb (64 kg)   SpO2 98%   BMI 20.82 kg/m   Opioid Risk Score:   Fall Risk Score:  `1  Depression screen PHQ 2/9  Depression screen [facility] 2/9 11/05/2018 09/17/2018 08/10/2018 12/31/2017 03/25/2011  Decreased Interest 2 1 0 1 0  Down, Depressed, Hopeless 0 0 0 0 0  PHQ - 2 Score 2 1 0 1 0  Altered sleeping 2 3 - - -  Tired, decreased energy 2 2 - - -  Change in appetite 2 2 - - -  Feeling bad or failure about yourself  0 0 - - -  Trouble concentrating 0 0 - - -  Moving slowly or fidgety/restless 0 0 - - -  Suicidal thoughts 0 0 - - -  PHQ-9 Score 8 8 - - -  Difficult doing work/chores Not difficult at all Not difficult at all - - -  Some recent data might be hidden     Review of Systems  Constitutional: Negative.   HENT: Negative.   Eyes: Negative.   Respiratory: Negative.   Cardiovascular: Negative.   Gastrointestinal: Negative.   Endocrine: Negative.   Genitourinary: Negative.   Musculoskeletal: Positive for arthralgias and gait problem.  Skin: Negative.   Allergic/Immunologic: Negative.   Hematological: Negative.   Psychiatric/Behavioral: Negative.   All other systems reviewed and are negative.      Objective:   Physical Exam Constitutional: No distress . Vital signs reviewed. Seated in wheelchair comfortably. HEENT: EOMI, oral membranes moist Neck: supple Cardiovascular: RRR without murmur. No JVD    Respiratory/Chest: CTA Bilaterally without wheezes or rales. Normal effort but on 2L oxygen. GI/Abdomen: BS +, non-tender, non-distended, PEG Ext: no clubbing, cyanosis, or edema Skin:   B/L toes gangrenous per baseline. Father able to  perform  dressing changes appropriately.  Psych: appears in pain; perseverating on topic.  Musc: No edema in extremities.  No tenderness in extremities. Neurological: Ox2-3- knew month, not day Motor: 4- to /5 in all 4's with pain inhibition in both feet.     Assessment & Plan:  1. Deficits with mobility, endurance, self-care secondary to encephalopathy with debility, PVD with gangrenous toes.             -Patient needs home therapy but is not receiving it. I will follow-up with SW regarding how we may help with this.  -He is receiving visiting nurse services twice per week but given the severity of his wounds would also benefit from wound care nurse visitation. Will discuss with SW as well.        2. Antithrombotics:  -PE/anticoagulation: Continue Eliquis 2.5 BID. Patient does not require refill.  3. Pain Management:                -Provided refills for Tramadol and Lyrica. Pain contract signed and UDS ordered.   -Advised that he should not use his mother's Oxycodone or any medications prescribed to other people and his father expressed understanding.   4. Follow-up: He has appropriate follow-up scheduled with PCP, vascular surgery, and IR for PEG removal.  5. Skin/Wound Care: Dry dressing to bilateral feet.   Observed father undressing and dressing wound and he is doing a good job. Would benefit from additional [place] nurse follow-up as well. See above.   All questions were answered. RTC in 1 month to assess progress with above interventions.

## 2020-03-07 NOTE — Telephone Encounter (Signed)
See Bary Leriche telephone encounter 3/23.

## 2020-03-07 NOTE — Telephone Encounter (Signed)
Contacted patients wife and informed that Dr. Ranell Patrick left  A message and a call back number on her answering service

## 2020-03-08 ENCOUNTER — Encounter: Payer: Medicare Other | Admitting: Physical Medicine and Rehabilitation

## 2020-03-08 ENCOUNTER — Telehealth: Payer: Self-pay

## 2020-03-08 DIAGNOSIS — D509 Iron deficiency anemia, unspecified: Secondary | ICD-10-CM | POA: Diagnosis not present

## 2020-03-08 DIAGNOSIS — N2581 Secondary hyperparathyroidism of renal origin: Secondary | ICD-10-CM | POA: Diagnosis not present

## 2020-03-08 DIAGNOSIS — Z992 Dependence on renal dialysis: Secondary | ICD-10-CM | POA: Diagnosis not present

## 2020-03-08 DIAGNOSIS — N186 End stage renal disease: Secondary | ICD-10-CM | POA: Diagnosis not present

## 2020-03-08 NOTE — Telephone Encounter (Signed)
Per clinic physician, pt states he has not received home therapies. SW spoke with liaison [facility] *[phone]) who will follow-up with branch to get insight. SW waiting on follow-up.

## 2020-03-09 ENCOUNTER — Other Ambulatory Visit: Payer: Self-pay

## 2020-03-09 ENCOUNTER — Telehealth: Payer: Self-pay | Admitting: *Deleted

## 2020-03-09 ENCOUNTER — Ambulatory Visit (INDEPENDENT_AMBULATORY_CARE_PROVIDER_SITE_OTHER): Payer: Medicare Other | Admitting: Family Medicine

## 2020-03-09 VITALS — BP 132/94

## 2020-03-09 DIAGNOSIS — M25579 Pain in unspecified ankle and joints of unspecified foot: Secondary | ICD-10-CM

## 2020-03-09 NOTE — Telephone Encounter (Signed)
Received call from [facility] PT with [facility] requesting VO for [facility] PT. Patient has not been seen since 10/2018. VO denied and advised to contact Dr. Charm Barges office. Please see phone note from 03/01/2020. Hubbard Hartshorn, BSN, RN-BC

## 2020-03-09 NOTE — Patient Instructions (Signed)
Here is the information for the wound care office.  You can call them to schedule an appt, you don't need a referral.  [place] [address] [place], [place] [address] [phone]

## 2020-03-09 NOTE — Telephone Encounter (Signed)
Thanks

## 2020-03-09 NOTE — Progress Notes (Signed)
Patient inadvertently presented to our clinic thinking this was the wound care clinic.  We have provided him the appropriate wound care contact information and directed patient where to go.  We will no charge him for this visit.

## 2020-03-10 DIAGNOSIS — N186 End stage renal disease: Secondary | ICD-10-CM | POA: Diagnosis not present

## 2020-03-10 DIAGNOSIS — D509 Iron deficiency anemia, unspecified: Secondary | ICD-10-CM | POA: Diagnosis not present

## 2020-03-10 DIAGNOSIS — N2581 Secondary hyperparathyroidism of renal origin: Secondary | ICD-10-CM | POA: Diagnosis not present

## 2020-03-10 DIAGNOSIS — Z992 Dependence on renal dialysis: Secondary | ICD-10-CM | POA: Diagnosis not present

## 2020-03-12 ENCOUNTER — Encounter (HOSPITAL_BASED_OUTPATIENT_CLINIC_OR_DEPARTMENT_OTHER): Payer: Medicare Other | Attending: Physician Assistant | Admitting: Internal Medicine

## 2020-03-12 ENCOUNTER — Other Ambulatory Visit: Payer: Self-pay

## 2020-03-12 DIAGNOSIS — J90 Pleural effusion, not elsewhere classified: Secondary | ICD-10-CM | POA: Diagnosis not present

## 2020-03-12 DIAGNOSIS — N186 End stage renal disease: Secondary | ICD-10-CM | POA: Diagnosis not present

## 2020-03-12 DIAGNOSIS — Z87891 Personal history of nicotine dependence: Secondary | ICD-10-CM | POA: Diagnosis not present

## 2020-03-12 DIAGNOSIS — Z992 Dependence on renal dialysis: Secondary | ICD-10-CM | POA: Diagnosis not present

## 2020-03-12 DIAGNOSIS — Z8249 Family history of ischemic heart disease and other diseases of the circulatory system: Secondary | ICD-10-CM | POA: Diagnosis not present

## 2020-03-12 DIAGNOSIS — I70235 Atherosclerosis of native arteries of right leg with ulceration of other part of foot: Secondary | ICD-10-CM | POA: Diagnosis not present

## 2020-03-12 DIAGNOSIS — I428 Other cardiomyopathies: Secondary | ICD-10-CM | POA: Insufficient documentation

## 2020-03-12 DIAGNOSIS — L98499 Non-pressure chronic ulcer of skin of other sites with unspecified severity: Secondary | ICD-10-CM | POA: Insufficient documentation

## 2020-03-12 DIAGNOSIS — I132 Hypertensive heart and chronic kidney disease with heart failure and with stage 5 chronic kidney disease, or end stage renal disease: Secondary | ICD-10-CM | POA: Diagnosis not present

## 2020-03-12 DIAGNOSIS — I509 Heart failure, unspecified: Secondary | ICD-10-CM | POA: Diagnosis not present

## 2020-03-12 DIAGNOSIS — I70263 Atherosclerosis of native arteries of extremities with gangrene, bilateral legs: Secondary | ICD-10-CM | POA: Insufficient documentation

## 2020-03-12 DIAGNOSIS — Z833 Family history of diabetes mellitus: Secondary | ICD-10-CM | POA: Insufficient documentation

## 2020-03-12 DIAGNOSIS — I70261 Atherosclerosis of native arteries of extremities with gangrene, right leg: Secondary | ICD-10-CM | POA: Diagnosis not present

## 2020-03-12 DIAGNOSIS — Z7901 Long term (current) use of anticoagulants: Secondary | ICD-10-CM | POA: Insufficient documentation

## 2020-03-12 DIAGNOSIS — Z809 Family history of malignant neoplasm, unspecified: Secondary | ICD-10-CM | POA: Insufficient documentation

## 2020-03-12 DIAGNOSIS — I70262 Atherosclerosis of native arteries of extremities with gangrene, left leg: Secondary | ICD-10-CM | POA: Diagnosis not present

## 2020-03-12 DIAGNOSIS — I313 Pericardial effusion (noninflammatory): Secondary | ICD-10-CM | POA: Insufficient documentation

## 2020-03-12 DIAGNOSIS — I4891 Unspecified atrial fibrillation: Secondary | ICD-10-CM | POA: Diagnosis not present

## 2020-03-12 DIAGNOSIS — I70245 Atherosclerosis of native arteries of left leg with ulceration of other part of foot: Secondary | ICD-10-CM | POA: Diagnosis not present

## 2020-03-12 NOTE — Progress Notes (Addendum)
[name] [name], [name] [name] ([id number]) Visit Report for 03/12/2020 Chief Complaint Document Details Patient Name: Date of Service: [name] [name] 03/12/2020 10:30 AM Medical Record [id number] Patient Account Number: [id number] Date of Birth/Sex: Treating RN: 07-29-1966 (54 y.o. Ernestene Mention Primary Care Provider: Nathanial Rancher Other Clinician: Referring Provider: Treating Provider/Extender:Tyrika Newman, Deirdre Pippins, OBED Weeks in Treatment: 0 Information Obtained from: Patient Chief Complaint 03/12/2020; patient is here for review of gangrenous changes in multiple toes of his bilateral feet Electronic Signature(s) Signed: 03/12/2020 5:31:36 PM By: Linton Ham MD Entered By: Linton Ham on 03/12/2020 12:58:03 -------------------------------------------------------------------------------- HPI Details Patient Name: Date of Service: [name] [name] 03/12/2020 10:30 AM Medical Record Number:[id number] Patient Account Number: [id number] Date of Birth/Sex: Treating RN: [birth year] (54 y.o. Ernestene Mention Primary Care Provider: Nathanial Rancher Other Clinician: Referring Provider: Treating Provider/Extender:Alif Petrak, Deirdre Pippins, OBED Weeks in Treatment: 0 History of Present Illness HPI Description: ADMISSION 03/12/2020 This is an unfortunate 54 year old man who has multiple medical issues. He has a nonischemic cardiomyopathy which is severe with an ejection fraction of 20%. He also has chronic renal failure on dialysis Tuesday Thursday and Saturday. He is not a diabetic. The problem apparently started on 11/16/2019 he had a cardiac arrest at dialysis which was apparently a PEA cardiac arrest. He received CPR from prolonged period of time. He was admitted to hospital found to have PE and A. fib. He also had a ventricular fib cardiac arrest during the hospitalization and was noted to have a pericardial effusion. During the course of a long stay I think in the ICU he was noted to  have gangrenous changes of his toes. He was seen by Dr. Trula Slade on 12/18. At the time it was felt that he had brisk Doppler signals. He was also either at that time or before on pressors which included Levophed. He apparently refused amputation of his toes. He went on to spend a prolonged period of time in rehab noted have gangrene of the toes as well. He has home health changing the dressing which is father also does which is a Betadine wet-to-dry. Past medical history includes a nonischemic cardiomyopathy, chronic renal failure on dialysis, pleural effusion, atrial fibrillation on Eliquis, polysubstance abuse. ABIs in our clinic were 1.1 on the right and 0.76 on the left Electronic Signature(s) Signed: 03/12/2020 5:31:36 PM By: Linton Ham MD Entered By: Linton Ham on 03/12/2020 13:01:58 -------------------------------------------------------------------------------- Physical Exam Details Patient Name: Date of Service: [name] [name] 03/12/2020 10:30 AM Medical Record Number:[id number] Patient Account Number: [id number] Date of Birth/Sex: Treating RN: 10-Aug-1966 (54 y.o. Ernestene Mention Primary Care Provider: Nathanial Rancher Other Clinician: Referring Provider: Treating Provider/Extender:Raeanna Soberanes, Deirdre Pippins, OBED Weeks in Treatment: 0 Constitutional Patient is hypertensive.. Pulse regular and within target range for patient.Marland Kitchen Respirations regular, non-labored and within target range.. Temperature is normal and within the target range for the patient.Marland Kitchen Appears in no distress. Respiratory Appears dyspneic. Oxygen on. Decreased air entry and dullness percussion in the right lower lobe compatible I think with the pleural effusion.. Cardiovascular Heart rhythm and rate regular, without murmur or gallop. No murmurs he appeared to be euvolemic.Marland Kitchen Femoral pulses were palpable through his jeans. I could not feel popliteal pulses on either side. Dorsalis pedis pulse on the left I could  not feel anything on the right.. Lymphatic Popliteal and inguinal areas of no adenopathy. Psychiatric appears at normal baseline. Notes Wound exam; every toe has dry gangrene at the tip. There is some more viable wound area proximally on some of the toes where probably  some of the eschar has come off. His forefeet bilaterally are very cold. He has dry areas on the anterior aspect of both knees which was trauma at the time of his initial arrest. I am not even sure that these are open. Electronic Signature(s) Signed: 03/12/2020 5:31:36 PM By: Linton Ham MD Entered By: Linton Ham on 03/12/2020 13:04:54 -------------------------------------------------------------------------------- Physician Orders Details Patient Name: Date of Service: [name] [name] 03/12/2020 10:30 AM Medical Record Number:[id number] Patient Account Number: [id number] Date of Birth/Sex: Treating RN: August 31, 1966 (54 y.o. Ernestene Mention Primary Care Provider: Nathanial Rancher Other Clinician: Referring Provider: Treating Provider/Extender:Josph Norfleet, Deirdre Pippins, OBED Weeks in Treatment: 0 Verbal / Phone Orders: No Diagnosis Coding Follow-up Appointments Return Appointment in 1 week. Dressing Change Frequency Change dressing every day. - both feet Wound Cleansing Clean wound with Normal Saline. Primary Wound Dressing Other: - wrap toes with betadine moistened gauze both feet Secondary Dressing Kerlix/Rolled Gauze - both feet Dry Gauze Additional Orders / Instructions Other: - follow up with Dr. Oneida Alar at [facility] for wound care. - [facility] to change twice a week. family and/or patient to change all other days. Electronic Signature(s) Signed: 03/14/2020 5:34:24 PM By: Deon Pilling Signed: 03/20/2020 6:13:52 PM By: Linton Ham MD Previous Signature: 03/12/2020 5:31:36 PM Version By: Linton Ham MD Previous Signature: 03/12/2020  5:31:53 PM Version By: Baruch Gouty RN, BSN Entered By: Deon Pilling on 03/14/2020 17:27:54 -------------------------------------------------------------------------------- Problem List Details Patient Name: Date of Service: MAURISIO, RUDDY 03/12/2020 10:30 AM Medical Record Number:[id number] Patient Account Number: [id number] Date of Birth/Sex: Treating RN: [birth year] (54 y.o. Ernestene Mention Primary Care Provider: Nathanial Rancher Other Clinician: Referring Provider: Treating Provider/Extender:Maxmilian Trostel, Deirdre Pippins, OBED Weeks in Treatment: 0 Active Problems ICD-10 Evaluated Encounter Code Description Active Date Today Diagnosis I70.235 Atherosclerosis of native arteries of right leg with 03/12/2020 No Yes ulceration of other part of foot I70.245 Atherosclerosis of native arteries of left leg with 03/12/2020 No Yes ulceration of other part of foot I70.261 Atherosclerosis of native arteries of extremities with 03/12/2020 No Yes gangrene, right leg I70.262 Atherosclerosis of native arteries of extremities with 03/12/2020 No Yes gangrene, left leg I42.9 Cardiomyopathy, unspecified 03/12/2020 No Yes Inactive Problems Resolved Problems Electronic Signature(s) Signed: 03/12/2020 5:31:36 PM By: Linton Ham MD Entered By: Linton Ham on 03/12/2020 12:00:39 -------------------------------------------------------------------------------- Progress Note Details Patient Name: Date of Service: D'ARCY, ABRAHA 03/12/2020 10:30 AM Medical Record Number:[id number] Patient Account Number: [id number] Date of Birth/Sex: Treating RN: 24-Mar-1966 (54 y.o. Ernestene Mention Primary Care Provider: Nathanial Rancher Other Clinician: Referring Provider: Treating Provider/Extender:Kanaya Gunnarson, Deirdre Pippins, OBED Weeks in Treatment: 0 Subjective Chief Complaint Information obtained from Patient 03/12/2020; patient is here for review of gangrenous changes in multiple toes of his bilateral feet History of  Present Illness (HPI) ADMISSION 03/12/2020 This is an unfortunate 54 year old man who has multiple medical issues. He has a nonischemic cardiomyopathy which is severe with an ejection fraction of 20%. He also has chronic renal failure on dialysis Tuesday Thursday and Saturday. He is not a diabetic. The problem apparently started on 11/16/2019 he had a cardiac arrest at dialysis which was apparently a PEA cardiac arrest. He received CPR from prolonged period of time. He was admitted to hospital found to have PE and A. fib. He also had a ventricular fib cardiac arrest during the hospitalization and was noted to have a pericardial effusion. During the course of a long stay I think in the ICU he was noted  to have gangrenous changes of his toes. He was seen by Dr. Trula Slade on 12/18. At the time it was felt that he had brisk Doppler signals. He was also either at that time or before on pressors which included Levophed. He apparently refused amputation of his toes. He went on to spend a prolonged period of time in rehab noted have gangrene of the toes as well. He has home health changing the dressing which is father also does which is a Betadine wet-to-dry. Past medical history includes a nonischemic cardiomyopathy, chronic renal failure on dialysis, pleural effusion, atrial fibrillation on Eliquis, polysubstance abuse. ABIs in our clinic were 1.1 on the right and 0.76 on the left Patient History Unable to Obtain Patient History due to Altered Mental Status. Information obtained from Patient. Allergies losartan Family History Cancer - Paternal Grandparents,Father, Diabetes - Mother, Heart Disease - Mother, Hypertension - Mother,Father,Paternal Grandparents, Kidney Disease - Mother, Seizures - Mother, No family history of Hereditary Spherocytosis, Lung Disease, Stroke, Thyroid Problems, Tuberculosis. Social History Former smoker, Marital Status - Single, Alcohol Use - Never, Drug Use - Prior History -  pot, Caffeine Use - Moderate. Medical History Eyes Denies history of Cataracts, Glaucoma, Optic Neuritis Ear/Nose/Mouth/Throat Denies history of Chronic sinus problems/congestion, Middle ear problems Hematologic/Lymphatic Denies history of Anemia, Hemophilia, Human Immunodeficiency Virus, Lymphedema, Sickle Cell Disease Respiratory Denies history of Aspiration, Asthma, Chronic Obstructive Pulmonary Disease (COPD), Pneumothorax, Sleep Apnea, Tuberculosis Cardiovascular Patient has history of Arrhythmia - a fib, Congestive Heart Failure, Hypertension Denies history of Angina, Coronary Artery Disease, Deep Vein Thrombosis, Hypotension, Myocardial Infarction, Peripheral Arterial Disease, Peripheral Venous Disease, Phlebitis, Vasculitis Gastrointestinal Denies history of Cirrhosis , Colitis, Crohnoos, Hepatitis A, Hepatitis B, Hepatitis C Endocrine Denies history of Type I Diabetes, Type II Diabetes Genitourinary Patient has history of End Stage Renal Disease Immunological Denies history of Lupus Erythematosus, Raynaudoos, Scleroderma Integumentary (Skin) Denies history of History of Burn Musculoskeletal Denies history of Gout, Rheumatoid Arthritis, Osteoarthritis, Osteomyelitis Neurologic Denies history of Dementia, Neuropathy, Quadriplegia, Paraplegia, Seizure Disorder Oncologic Denies history of Received Chemotherapy, Received Radiation Psychiatric Denies history of Anorexia/bulimia, Confinement Anxiety Review of Systems (ROS) Constitutional Symptoms (General Health) Denies complaints or symptoms of Fatigue, Fever, Chills, Marked Weight Change. Eyes Denies complaints or symptoms of Dry Eyes, Vision Changes, Glasses / Contacts. Ear/Nose/Mouth/Throat Denies complaints or symptoms of Chronic sinus problems or rhinitis. Respiratory Denies complaints or symptoms of Chronic or frequent coughs, Shortness of Breath. Cardiovascular Denies complaints or symptoms of Chest  pain. Gastrointestinal Denies complaints or symptoms of Frequent diarrhea, Nausea, Vomiting. Endocrine Denies complaints or symptoms of Heat/cold intolerance. Genitourinary Denies complaints or symptoms of Frequent urination. Integumentary (Skin) Complains or has symptoms of Wounds. Musculoskeletal Denies complaints or symptoms of Muscle Pain, Muscle Weakness. Neurologic Denies complaints or symptoms of Numbness/parasthesias. Psychiatric Denies complaints or symptoms of Claustrophobia, Suicidal. Objective Constitutional Patient is hypertensive.. Pulse regular and within target range for patient.Marland Kitchen Respirations regular, non-labored and within target range.. Temperature is normal and within the target range for the patient.Marland Kitchen Appears in no distress. Vitals Time Taken: 10:37 AM, Height: 69 in, Source: Stated, Weight: 141 lbs, Source: Stated, BMI: 20.8, Temperature: 98.1 F, Pulse: 98 bpm, Respiratory Rate: 18 breaths/min, Blood Pressure: 145/89 mmHg. Respiratory Appears dyspneic. Oxygen on. Decreased air entry and dullness percussion in the right lower lobe compatible I think with the pleural effusion.. Cardiovascular Heart rhythm and rate regular, without murmur or gallop. No murmurs he appeared to be euvolemic.Marland Kitchen Femoral pulses were palpable through his jeans. I  could not feel popliteal pulses on either side. Dorsalis pedis pulse on the left I could not feel anything on the right.. Lymphatic Popliteal and inguinal areas of no adenopathy. Psychiatric appears at normal baseline. General Notes: Wound exam; every toe has dry gangrene at the tip. There is some more viable wound area proximally on some of the toes where probably some of the eschar has come off. His forefeet bilaterally are very cold. ooHe has dry areas on the anterior aspect of both knees which was trauma at the time of his initial arrest. I am not even sure that these are open. Integumentary (Hair, Skin) Wound #1 status  is Open. Original cause of wound was Gradually Appeared. The wound is located on the Left Foot. The wound measures 7.5cm length x 12cm width x 0.1cm depth; 70.686cm^2 area and 7.069cm^3 volume. There is Fat Layer (Subcutaneous Tissue) Exposed exposed. There is no tunneling or undermining noted. There is a medium amount of serosanguineous drainage noted. There is small (1-33%) red, pink granulation within the wound bed. There is a large (67-100%) amount of necrotic tissue within the wound bed including Eschar and Adherent Slough. Wound #2 status is Open. Original cause of wound was Gradually Appeared. The wound is located on the Right Foot. The wound measures 2.5cm length x 9cm width x 0.1cm depth; 17.671cm^2 area and 1.767cm^3 volume. There is Fat Layer (Subcutaneous Tissue) Exposed exposed. There is no tunneling or undermining noted. There is a medium amount of serosanguineous drainage noted. There is small (1-33%) red, pink granulation within the wound bed. There is a large (67-100%) amount of necrotic tissue within the wound bed including Eschar and Adherent Slough. Wound #3 status is Open. Original cause of wound was Gradually Appeared. The wound is located on the Right Knee. The wound measures 2cm length x 4cm width x 0.1cm depth; 6.283cm^2 area and 0.628cm^3 volume. There is no tunneling or undermining noted. There is a none present amount of drainage noted. There is a large (67-100%) amount of necrotic tissue within the wound bed including Eschar. Wound #4 status is Open. Original cause of wound was Trauma. The wound is located on the Left Knee. The wound measures 1.9cm length x 1.8cm width x 0.1cm depth; 2.686cm^2 area and 0.269cm^3 volume. There is no tunneling or undermining noted. There is a none present amount of drainage noted. There is no granulation within the wound bed. There is a large (67-100%) amount of necrotic tissue within the wound bed including Eschar. 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 Dressing Change Frequency: Change dressing every day. - both feet Wound Cleansing: Clean wound with Normal Saline. Primary Wound Dressing: Other: - wrap toes with betadine moistened gauze both feet Secondary Dressing: Kerlix/Rolled Gauze - both feet Dry Gauze Additional Orders / Instructions: Other: - follow up with [facility]. [facility] at [facility]: [facility] for wound care. - [facility] 1. I do not disagree with the Betadine wet-to-dry. Our clinic worked hard to get him an appointment on 3/31 with vascular 2. Although his Doppler signals in the hospital were good and his ABIs are really not too bad in our clinic  his feet are very cold and the patient is in a lot of pain not just in his toes I suspect there is more of an ischemic issue here then just distal microvascular. 3. I wonder if vascular will see him as a candidate for an angiogram given the issues he has had recently. He rates his pain is constant at 8 out of 10 making it difficult for him to rest at night. 4. I could see no evidence of congestive heart failure at the bedside. 5. The areas on the patella were abrasions. I did not debride these. There may not be an open wound here. Interestingly both of these areas were cool as well 6. I will see him back next week after review by vascular. The major question I have is does he have macrovascular disease which seems supported by my physical exam but not so much by other objective evidence. At the time in the hospital I  think with Levophed and possible micro emboli I think this was felt to be the etiology of this but at the bedside today it seems like there is more I spent 45 minutes on the review of this patient's records, face-to-face evaluation including physical examination and preparation of this record Electronic Signature(s) Signed: 03/12/2020 5:31:36 PM By: Linton Ham MD Entered By: Linton Ham on 03/12/2020 13:09:18 -------------------------------------------------------------------------------- HxROS Details Patient Name: Date of Service: DAUD, CAYER 03/12/2020 10:30 AM Medical Record Number:[id number] Patient Account Number: [id number] Date of Birth/Sex: Treating RN: [birth year] (53 y.o. Jerilynn Mages) Carlene Coria Primary Care Provider: Nathanial Rancher Other Clinician: Referring Provider: Treating Provider/Extender:Jd Mccaster, Deirdre Pippins, OBED Weeks in Treatment: 0 Unable to Obtain Patient History due to Altered Mental Status Information Obtained From Patient Constitutional Symptoms (General Health) Complaints and Symptoms: Negative for: Fatigue; Fever; Chills; Marked Weight Change Eyes Complaints and Symptoms: Negative for: Dry Eyes; Vision Changes; Glasses / Contacts Medical History: Negative for: Cataracts; Glaucoma; Optic Neuritis Ear/Nose/Mouth/Throat Complaints and Symptoms: Negative for: Chronic sinus problems or rhinitis Medical History: Negative for: Chronic sinus problems/congestion; Middle ear problems Respiratory Complaints and Symptoms: Negative for: Chronic or frequent coughs; Shortness of Breath Medical History: Negative for: Aspiration; Asthma; Chronic Obstructive Pulmonary Disease (COPD); Pneumothorax; Sleep Apnea; Tuberculosis Cardiovascular Complaints and Symptoms: Negative for: Chest pain Medical History: Positive for: Arrhythmia - a fib; Congestive Heart Failure; Hypertension Negative for: Angina; Coronary Artery Disease; Deep Vein Thrombosis; Hypotension;  Myocardial Infarction; Peripheral Arterial Disease; Peripheral Venous Disease; Phlebitis; Vasculitis Gastrointestinal Complaints and Symptoms: Negative for: Frequent diarrhea; Nausea; Vomiting Medical History: Negative for: Cirrhosis ; Colitis; Crohns; Hepatitis A; Hepatitis B; Hepatitis C Endocrine Complaints and Symptoms: Negative for: Heat/cold intolerance Medical History: Negative for: Type I Diabetes; Type II Diabetes Genitourinary Complaints and Symptoms: Negative for: Frequent urination Medical History: Positive for: End Stage Renal Disease Integumentary (Skin) Complaints and Symptoms: Positive for: Wounds Medical History: Negative for: History of Burn Musculoskeletal Complaints and Symptoms: Negative for: Muscle Pain; Muscle Weakness Medical History: Negative for: Gout; Rheumatoid Arthritis; Osteoarthritis; Osteomyelitis Neurologic Complaints and Symptoms: Negative for: Numbness/parasthesias Medical History: Negative for: Dementia; Neuropathy; Quadriplegia; Paraplegia; Seizure Disorder Psychiatric Complaints and Symptoms: Negative for: Claustrophobia; Suicidal Medical History: Negative for: Anorexia/bulimia; Confinement Anxiety Hematologic/Lymphatic Medical History: Negative for: Anemia; Hemophilia; Human Immunodeficiency Virus; Lymphedema; Sickle Cell Disease Immunological Medical History: Negative for: Lupus Erythematosus; Raynauds; Scleroderma Oncologic Medical History: Negative for: Received Chemotherapy; Received Radiation Immunizations Pneumococcal Vaccine: Received Pneumococcal Vaccination: No Implantable Devices None Family and Social History Cancer: Yes - Paternal Grandparents,Father; Diabetes: Yes - Mother; Heart Disease:  Yes - Mother; Hereditary Spherocytosis: No; Hypertension: Yes - Mother,Father,Paternal Grandparents; Kidney Disease: Yes - Mother; Lung Disease: No; Seizures: Yes - Mother; Stroke: No; Thyroid Problems: No; Tuberculosis: No;  Former smoker; Marital Status - Single; Alcohol Use: Never; Drug Use: Prior History - pot; Caffeine Use: Moderate; Financial Concerns: No; Food, Clothing or Shelter Needs: No; Support System Lacking: No; Transportation Concerns: No Electronic Signature(s) Signed: 03/12/2020 5:30:47 PM By: Carlene Coria RN Signed: 03/12/2020 5:31:36 PM By: Linton Ham MD Entered By: Carlene Coria on 03/12/2020 10:46:49 -------------------------------------------------------------------------------- SuperBill Details Patient Name: Date of Service: CHUN, SELLEN 03/12/2020 Medical Record Number:[id number] Patient Account Number: [id number] Date of Birth/Sex: Treating RN: 03-Feb-1966 (54 y.o. Ernestene Mention Primary Care Provider: Nathanial Rancher Other Clinician: Referring Provider: Treating Provider/Extender:Sulamita Lafountain, Deirdre Pippins, OBED Weeks in Treatment: 0 Diagnosis Coding ICD-10 Codes Code Description I70.235 Atherosclerosis of native arteries of right leg with ulceration of other part of foot I70.245 Atherosclerosis of native arteries of left leg with ulceration of other part of foot I70.261 Atherosclerosis of native arteries of extremities with gangrene, right leg I70.262 Atherosclerosis of native arteries of extremities with gangrene, left leg I42.9 Cardiomyopathy, unspecified Facility Procedures CPT4 Code: [id number] Description: 76283 - WOUND CARE VISIT-LEV 5 EST PT Modifier: Quantity: 1 Physician Procedures CPT4 Code Description: [id number] - WC PHYS LEVEL 4 - NEW PT ICD-10 Diagnosis Description I70.235 Atherosclerosis of native arteries of right leg with ulcer I70.262 Atherosclerosis of native arteries of extremities with gan I70.245 Atherosclerosis of  native arteries of left leg with ulcera I70.261 Atherosclerosis of native arteries of extremities with gan Modifier: ation of other pa grene, left leg tion of other par grene, right leg Quantity: 1 rt of foot t of foot Electronic  Signature(s) Signed: 03/14/2020 6:50:57 PM By: Baruch Gouty RN, BSN Signed: 03/20/2020 6:13:52 PM By: Linton Ham MD Previous Signature: 03/12/2020 5:31:36 PM Version By: Linton Ham MD Entered By: Baruch Gouty on 03/13/2020 14:48:48

## 2020-03-12 NOTE — Progress Notes (Addendum)
ARRIE, ZUERCHER ([id number]) Visit Report for 03/12/2020 Allergy List Details Patient Name: Date of Service: [name] [name], [name] [name] 03/12/2020 10:30 AM Medical Record [id number] Patient Account Number: [id number] Date of Birth/Sex: Treating RN: 02/28/1966 (54 y.o. Jerilynn Mages) Carlene Coria Primary Care Lunabelle Oatley: Nathanial Rancher Other Clinician: Referring Keghan Mcfarren: Treating Codee Bloodworth/Extender:Robson, Deirdre Pippins, OBED Weeks in Treatment: 0 Allergies Active Allergies losartan Allergy Notes Electronic Signature(s) Signed: 03/12/2020 5:30:47 PM By: Carlene Coria RN Entered By: Carlene Coria on 03/12/2020 10:38:54 -------------------------------------------------------------------------------- [place] Information Details Patient Name: Date of Service: JOSSIAH, SMOAK 03/12/2020 10:30 AM Medical Record Number:[id number] Patient Account Number: [id number] Date of Birth/Sex: Treating RN: Sep 18, 1966 (54 y.o. Jerilynn Mages) Carlene Coria Primary Care Declynn Lopresti: Nathanial Rancher Other Clinician: Referring Trinidad Petron: Treating Kayloni Rocco/Extender:Robson, Deirdre Pippins, OBED Weeks in Treatment: 0 Visit Information Patient Arrived: Ambulatory Arrival Time: 10:36 Accompanied By: father Transfer Assistance: None Patient Identification Verified: Yes Secondary Verification Process Yes Completed: Patient Has Alerts: Yes Patient Alerts: Patient on Blood Thinner Electronic Signature(s) Signed: 03/12/2020 5:30:47 PM By: Carlene Coria RN Entered By: Carlene Coria on 03/12/2020 10:37:13 -------------------------------------------------------------------------------- Clinic Level of Care Assessment Details Patient Name: Date of Service: ORIAN, FIGUEIRA 03/12/2020 10:30 AM Medical Record Number:[id number] Patient Account Number: [id number] Date of Birth/Sex: Treating RN: Feb 08, 1966 (54 y.o. Ernestene Mention Primary Care Caralynn Gelber: Nathanial Rancher Other Clinician: Referring Alaisa Moffitt: Treating Kajol Crispen/Extender:Robson, Deirdre Pippins,  OBED Weeks in Treatment: 0 Clinic Level of Care Assessment Items TOOL 2 Quantity Score []  - Use when only an EandM is performed on the INITIAL visit 0 ASSESSMENTS - Nursing Assessment / Reassessment X - General Physical Exam (combine w/ comprehensive assessment (listed just below) 1 20 when performed on new pt. evals) X - Comprehensive Assessment (HX, ROS, Risk Assessments, Wounds Hx, etc.) 1 25 ASSESSMENTS - Wound and Skin Assessment / Reassessment []  - Simple Wound Assessment / Reassessment - one wound 0 X - Complex Wound Assessment / Reassessment - multiple wounds 4 5 []  - Dermatologic / Skin Assessment (not related to wound area) 0 ASSESSMENTS - Ostomy and/or Continence Assessment and Care []  - Incontinence Assessment and Management 0 []  - Ostomy Care Assessment and Management (repouching, etc.) 0 PROCESS - Coordination of Care X - Simple Patient / Family Education for ongoing care 1 15 []  - Complex (extensive) Patient / Family Education for ongoing care 0 X - Staff obtains Programmer, systems, Records, Test Results / Process Orders 1 10 []  - Staff telephones HHA, Nursing Homes / Clarify orders / etc 0 []  - Routine Transfer to another Facility (non-emergent condition) 0 []  - Routine Hospital Admission (non-emergent condition) 0 X - New Admissions / Biomedical engineer / Ordering NPWT, Apligraf, etc. 1 15 []  - Emergency Hospital Admission (emergent condition) 0 X - Simple Discharge Coordination 1 10 []  - Complex (extensive) Discharge Coordination 0 PROCESS - Special Needs []  - Pediatric / Minor Patient Management 0 []  - Isolation Patient Management 0 []  - Hearing / Language / Visual special needs 0 []  - Assessment of Community assistance (transportation, D/C planning, etc.) 0 []  - Additional assistance / Altered mentation 0 []  - Support Surface(s) Assessment (bed, cushion, seat, etc.) 0 INTERVENTIONS - Wound Cleansing / Measurement X - Wound Imaging (photographs - any number of  wounds) 1 5 []  - Wound Tracing (instead of photographs) 0 []  - Simple Wound Measurement - one wound 0 X - Complex Wound Measurement - multiple wounds 4 5 []  - Simple Wound Cleansing - one wound 0 X - Complex Wound Cleansing - multiple wounds 4 5 INTERVENTIONS - Wound Dressings X -  Small Wound Dressing one or multiple wounds 4 10 []  - Medium Wound Dressing one or multiple wounds 0 []  - Large Wound Dressing one or multiple wounds 0 []  - Application of Medications - injection 0 INTERVENTIONS - Miscellaneous []  - External ear exam 0 []  - Specimen Collection (cultures, biopsies, blood, body fluids, etc.) 0 []  - Specimen(s) / Culture(s) sent or taken to Lab for analysis 0 []  - Patient Transfer (multiple staff / Harrel Lemon Lift / Similar devices) 0 []  - Simple Staple / Suture removal (25 or less) 0 []  - Complex Staple / Suture removal (26 or more) 0 []  - Hypo / Hyperglycemic Management (close monitor of Blood Glucose) 0 []  - Ankle / Brachial Index (ABI) - do not check if billed separately 0 Has the patient been seen at the hospital within the last three years: Yes Total Score: 200 Level Of Care: New/Established - Level 5 Electronic Signature(s) Signed: 03/12/2020 5:31:53 PM By: Baruch Gouty RN, BSN Entered By: Baruch Gouty on 03/12/2020 11:42:52 -------------------------------------------------------------------------------- Encounter Discharge Information Details Patient Name: Date of Service: TRIGO, WINTERBOTTOM 03/12/2020 10:30 AM Medical Record Number:[id number] Patient Account Number: [id number] Date of Birth/Sex: Treating RN: 04-Jul-1966 (54 y.o. Marvis Repress Primary Care Kamdyn Colborn: Nathanial Rancher Other Clinician: Referring Ell Tiso: Treating Ebonee Stober/Extender:Robson, Deirdre Pippins, OBED Weeks in Treatment: 0 Encounter Discharge Information Items Discharge Condition: Stable Ambulatory Status: Wheelchair Discharge Destination: Home Transportation: Private Auto Accompanied By:  family member Schedule Follow-up Appointment: Yes Clinical Summary of Care: Patient Declined Electronic Signature(s) Signed: 03/12/2020 5:06:29 PM By: Kela Millin Entered By: Kela Millin on 03/12/2020 11:59:53 -------------------------------------------------------------------------------- Lower Extremity Assessment Details Patient Name: Date of Service: MARTEN, ILES 03/12/2020 10:30 AM Medical Record Number:[id number] Patient Account Number: [id number] Date of Birth/Sex: Treating RN: 19-Jan-1966 (53 y.o. Jerilynn Mages) Carlene Coria Primary Care Iyan Flett: Nathanial Rancher Other Clinician: Referring Zeniah Briney: Treating Masami Plata/Extender:Robson, Deirdre Pippins, OBED Weeks in Treatment: 0 Edema Assessment Assessed: [Left: No] [Right: No] E[Left: dema] [Right: :] Calf Left: Right: Point of Measurement: 42 cm From Medial Instep 30.5 cm 30 cm Ankle Left: Right: Point of Measurement: 11 cm From Medial Instep 24 cm 22 cm Vascular Assessment Blood Pressure: Brachial: [Left:145] [Right:145] Ankle: [Left:Dorsalis Pedis: 110 0.76] [Right:Dorsalis Pedis: 160 1.10] Electronic Signature(s) Signed: 03/12/2020 5:30:47 PM By: Carlene Coria RN Entered By: Carlene Coria on 03/12/2020 11:10:56 -------------------------------------------------------------------------------- Multi Wound Chart Details Patient Name: Date of Service: GARRELL, FLAGG 03/12/2020 10:30 AM Medical Record Number:[id number] Patient Account Number: [id number] Date of Birth/Sex: Treating RN: [birth year] (54 y.o. Ernestene Mention Primary Care Pansey Pinheiro: Nathanial Rancher Other Clinician: Referring Aniko Finnigan: Treating Jerika Wales/Extender:Robson, Deirdre Pippins, OBED Weeks in Treatment: 0 Vital Signs Height(in): 69 Pulse(bpm): 27 Weight(lbs): 141 Blood Pressure(mmHg): 145/89 Body Mass Index(BMI): 21 Temperature(F): 98.1 Respiratory 18 Rate(breaths/min): Photos: [1:No Photos] [2:No Photos] [3:No Photos] Wound Location: [1:Left Foot]  [2:Right Foot] [3:Right Knee] Wounding Event: [1:Gradually Appeared] [2:Gradually Appeared] [3:Gradually Appeared] Primary Etiology: [1:Venous Leg Ulcer] [2:Venous Leg Ulcer] [3:Trauma, Other] Comorbid History: [1:Arrhythmia, Congestive Heart Failure, Hypertension, Heart Failure, Hypertension, Heart Failure, Hypertension, End Stage Renal Disease End Stage Renal Disease End Stage Renal Disease] [2:Arrhythmia, Congestive] [3:Arrhythmia,  Congestive] Date Acquired: [1:02/10/2020] [2:02/10/2020] [3:02/03/2020] Weeks of Treatment: [1:0] [2:0] [3:0] Wound Status: [1:Open] [2:Open] [3:Open] Clustered Wound: [1:Yes] [2:Yes] [3:No] Measurements L x W x D 7.5x12x0.1 [2:2.5x9x0.1] [3:2x4x0.1] (cm) Area (cm) : [1:70.686] [2:17.671] [3:6.283] Volume (cm) : [1:7.069] [2:1.767] [3:0.628] % Reduction in Area: [1:0.00%] [2:0.00%] [3:0.00%] % Reduction in Volume: [1:0.00%] [2:0.00%] [3:0.00%] Classification: [1:Full Thickness Without Exposed Support Structures Exposed Support Structures Exposed  Support Structures] [2:Full Thickness Without] [3:Full Thickness Without] Exudate Amount: [1:Medium] [2:Medium] [3:None Present] Exudate Type: [1:Serosanguineous] [2:Serosanguineous] [3:N/A] Exudate Color: [1:red, brown] [2:red, brown] [3:N/A] Granulation Amount: [1:Small (1-33%)] [2:Small (1-33%)] [3:N/A] Granulation Quality: [1:Red, Pink] [2:Red, Pink] [3:N/A] Necrotic Amount: [1:Large (67-100%)] [2:Large (67-100%)] [3:N/A] Necrotic Tissue: [1:Eschar, Adherent Slough Eschar, [place] Exposed Structures: [1:Fat Layer (Subcutaneous Fat Layer (Subcutaneous Fascia: No Tissue) Exposed: Yes Fascia: No Tendon: No Muscle: No Joint: No Bone: No] [2:Tissue) Exposed: Yes Fascia: No Tendon: No Muscle: No Joint: No Bone: No] [3:Fat Layer (Subcutaneous Tissue)  Exposed: No Tendon: No Muscle: No Joint: No Bone: No] Epithelialization: [1:None 4] [2:None N/A] [3:None N/A] Photos: [1:No Photos] [2:N/A] [3:N/A] Wound  Location: [1:Left Knee] [2:N/A] [3:N/A] Wounding Event: [1:Trauma] [2:N/A] [3:N/A] Primary Etiology: [1:Trauma, Other] [2:N/A] [3:N/A] Comorbid History: [1:Arrhythmia, Congestive Heart Failure, Hypertension, End Stage Renal Disease] [2:N/A] [3:N/A] Date Acquired: [1:02/03/2020] [2:N/A] [3:N/A] Weeks of Treatment: [1:0] [2:N/A] [3:N/A] Wound Status: [1:Open] [2:N/A] [3:N/A] Clustered Wound: [1:No] [2:N/A] [3:N/A] Measurements L x W x D 1.9x1.8x0.1 [2:N/A] [3:N/A] (cm) Area (cm) : [1:2.686] [2:N/A] [3:N/A] Volume (cm) : [1:0.269] [2:N/A] [3:N/A] % Reduction in Area: [1:0.00%] [2:N/A] [3:N/A] % Reduction in Volume: 0.00% [2:N/A] [3:N/A] Classification: [1:Full Thickness Without Exposed Support Structures] [2:N/A] [3:N/A] Exudate Amount: [1:None Present] [2:N/A] [3:N/A] Exudate Type: [1:N/A] [2:N/A] [3:N/A] Exudate Color: [1:N/A] [2:N/A] [3:N/A] Granulation Amount: [1:None Present (0%)] [2:N/A] [3:N/A] Granulation Quality: [1:N/A] [2:N/A] [3:N/A] Necrotic Amount: [1:Large (67-100%)] [2:N/A] [3:N/A] Necrotic Tissue: [1:Eschar] [2:N/A] [3:N/A] Exposed Structures: [1:Fascia: No Fat Layer (Subcutaneous Tissue) Exposed: No Tendon: No Muscle: No Joint: No Bone: No None] [2:N/A N/A] [3:N/A N/A] Treatment Notes Wound #1 (Left Foot) 1. Cleanse With Wound Cleanser 3. Primary Dressing Applied Other primary dressing (specifiy in notes) 4. Secondary Dressing Dry Gauze Roll Gauze Other secondary dressing (specify in notes) Notes paint with betadine to all, lightly moistened 4x4 between toes and toes/kerlix/netting Wound #2 (Right Foot) 1. Cleanse With Wound Cleanser 3. Primary Dressing Applied Other primary dressing (specifiy in notes) 4. Secondary Dressing Dry Gauze Roll Gauze Other secondary dressing (specify in notes) Notes paint with betadine to all, lightly moistened 4x4 between toes and toes/kerlix/netting Wound #3 (Right Knee) 1. Cleanse With Wound Cleanser 3. Primary Dressing  Applied Other primary dressing (specifiy in notes) 4. Secondary Dressing Dry Gauze Roll Gauze Other secondary dressing (specify in notes) Notes paint with betadine to all, lightly moistened 4x4 between toes and toes/kerlix/netting Wound #4 (Left Knee) 1. Cleanse With Wound Cleanser 3. Primary Dressing Applied Other primary dressing (specifiy in notes) 4. Secondary Dressing Dry Gauze Roll Gauze Other secondary dressing (specify in notes) Notes paint with betadine to all, lightly moistened 4x4 between toes and toes/kerlix/netting Electronic Signature(s) Signed: 03/12/2020 5:31:36 PM By: Linton Ham MD Signed: 03/12/2020 5:31:53 PM By: Baruch Gouty RN, BSN Entered By: Linton Ham on 03/12/2020 12:57:41 -------------------------------------------------------------------------------- Multi-Disciplinary Care Plan Details Patient Name: Date of Service: COLBEY, WIRTANEN 03/12/2020 10:30 AM Medical Record Number:[id number] Patient Account Number: [id number] Date of Birth/Sex: Treating RN: 1966/08/09 (54 y.o. Ernestene Mention Primary Care Kc Sedlak: Nathanial Rancher Other Clinician: Referring Tyeisha Dinan: Treating Oluwateniola Leitch/Extender:Robson, Deirdre Pippins, OBED Weeks in Treatment: 0 Active Inactive Abuse / Safety / Falls / Self Care Management Nursing Diagnoses: Potential for falls Goals: Patient/caregiver will verbalize/demonstrate measures taken to prevent injury and/or falls Date Initiated: 03/12/2020 Target Resolution Date: 04/11/2020 Goal Status: Active Interventions: Assess fall risk on admission and as needed Assess impairment of mobility on admission and as needed per policy Notes: Necrotic Tissue Nursing Diagnoses:  Impaired tissue integrity related to necrotic/devitalized tissue Knowledge deficit related to management of necrotic/devitalized tissue Goals: Necrotic/devitalized tissue will be minimized in the wound bed Date Initiated: 03/12/2020 Target Resolution Date:  04/09/2020 Goal Status: Active Patient/caregiver will verbalize understanding of reason and process for debridement of necrotic tissue Date Initiated: 03/12/2020 Target Resolution Date: 04/09/2020 Goal Status: Active Interventions: Assess patient pain level pre-, during and post procedure and prior to discharge Provide education on necrotic tissue and debridement process Treatment Activities: Apply topical anesthetic as ordered : 03/12/2020 Notes: Tissue Oxygenation Nursing Diagnoses: Actual ineffective tissue perfusion; peripheral (select once diagnosis is confirmed) Knowledge deficit related to disease process and management Potential alteration in peripheral tissue perfusion (select prior to confirmation of diagnosis) Goals: Patient/caregiver will verbalize understanding of disease process and disease management Date Initiated: 03/12/2020 Target Resolution Date: 04/09/2020 Goal Status: Active Interventions: Assess patient understanding of disease process and management upon diagnosis and as needed Assess peripheral arterial status upon admission and as needed Provide education on tissue oxygenation and ischemia Notes: Wound/Skin Impairment Nursing Diagnoses: Impaired tissue integrity Knowledge deficit related to ulceration/compromised skin integrity Goals: Patient/caregiver will verbalize understanding of skin care regimen Date Initiated: 03/12/2020 Target Resolution Date: 04/09/2020 Goal Status: Active Ulcer/skin breakdown will have a volume reduction of 30% by week 4 Date Initiated: 03/12/2020 Target Resolution Date: 04/09/2020 Goal Status: Active Interventions: Assess patient/caregiver ability to obtain necessary supplies Assess patient/caregiver ability to perform ulcer/skin care regimen upon admission and as needed Assess ulceration(s) every visit Provide education on ulcer and skin care Treatment Activities: Skin care regimen initiated : 03/12/2020 Topical wound  management initiated : 03/12/2020 Notes: Electronic Signature(s) Signed: 03/12/2020 5:31:53 PM By: Baruch Gouty RN, BSN Entered By: Baruch Gouty on 03/12/2020 11:39:43 -------------------------------------------------------------------------------- Pain Assessment Details Patient Name: Date of Service: KIING, DEAKIN 03/12/2020 10:30 AM Medical Record Number:[id number] Patient Account Number: [id number] Date of Birth/Sex: Treating RN: 12/21/1965 (53 y.o. Jerilynn Mages) Carlene Coria Primary Care Adem Costlow: Other Clinician: Nathanial Rancher Referring Emeka Lindner: Treating Micco Bourbeau/Extender:Robson, Deirdre Pippins, OBED Weeks in Treatment: 0 Active Problems Location of Pain Severity and Description of Pain Patient Has Paino Yes Site Locations With Dressing Change: Yes Duration of the Pain. Constant / Intermittento Constant Rate the pain. Current Pain Level: 8 Worst Pain Level: 10 Least Pain Level: 2 Tolerable Pain Level: 5 Character of Pain Describe the Pain: Aching, Throbbing Pain Management and Medication Current Pain Management: Medication: Yes Cold Application: No Rest: Yes Massage: No Activity: No T.E.N.S.: No Heat Application: No Leg drop or elevation: No Is the Current Pain Management Adequate: Inadequate How does your wound impact your activities of daily livingo Sleep: Yes Bathing: No Appetite: No Relationship With Others: No Bladder Continence: No Emotions: No Bowel Continence: No Work: No Toileting: No Drive: No Dressing: No Hobbies: No Electronic Signature(s) Signed: 03/12/2020 5:30:47 PM By: Carlene Coria RN Entered By: Carlene Coria on 03/12/2020 11:19:12 -------------------------------------------------------------------------------- Patient/Caregiver Education Details Patient Name: Date of Service: [name] 3/29/2021andnbsp10:30 AM Medical Record Number:[id number] Patient Account Number: [id number] Date of Birth/Gender: 20-May-1966 (53 y.o. M) Treating RN:  Baruch Gouty Primary Care Physician: Nathanial Rancher Other Clinician: Referring Physician: Treating Physician/Extender:Robson, Deirdre Pippins, OBED Weeks in Treatment: 0 Education Assessment Education Provided To: Patient Education Topics Provided Nutrition: Handouts: Nutrition Methods: Explain/Verbal, Printed Responses: Reinforcements needed, State content correctly Tissue Oxygenation: Handouts: Peripheral Arterial Disease and Related Ulcers Methods: Explain/Verbal, Printed Responses: Reinforcements needed, State content correctly Welcome To [facility] [place]: Handouts: Welcome To [facility] Methods: Explain/Verbal, Printed Responses: Reinforcements  needed Wound/Skin Impairment: Handouts: Caring for Your Ulcer, Skin Care Do's and Dont's Methods: Explain/Verbal, Printed Responses: Reinforcements needed, State content correctly Electronic Signature(s) Signed: 03/12/2020 5:31:53 PM By: Baruch Gouty RN, BSN Entered By: Baruch Gouty on 03/12/2020 11:41:34 -------------------------------------------------------------------------------- Wound Assessment Details Patient Name: Date of Service: TYTAN, SANDATE 03/12/2020 10:30 AM Medical Record Number:[id number] Patient Account Number: [id number] Date of Birth/Sex: Treating RN: Nov 14, 1966 (53 y.o. Jerilynn Mages) Carlene Coria Primary Care Aveion Nguyen: Nathanial Rancher Other Clinician: Referring Bao Coreas: Treating Taunya Goral/Extender:Robson, Deirdre Pippins, OBED Weeks in Treatment: 0 Wound Status Wound Number: 1 Primary Venous Leg Ulcer Etiology: Wound Location: Left Foot Wound Open Wounding Event: Gradually Appeared Status: Date Acquired: 02/10/2020 Comorbid Arrhythmia, Congestive Heart Failure, Weeks Of Treatment: 0 History: Hypertension, End Stage Renal Disease Clustered Wound: Yes Photos Photo Uploaded By: Mikeal Hawthorne on 03/16/2020 10:05:48 Wound Measurements Length: (cm) 7.5 % Reduct Width: (cm) 12 % Reduct Depth: (cm)  0.1 Epitheli Area: (cm) 70.686 Tunneli Volume: (cm) 7.069 Undermi Wound Description Classification: Full Thickness Without Exposed Support Foul Od Structures Slough/ Exudate Medium Amount: Exudate Serosanguineous Type: Exudate red, brown Color: Wound Bed Granulation Amount: Small (1-33%) Granulation Quality: Red, Pink Fascia Necrotic Amount: Large (67-100%) Fat Lay Necrotic Quality: Eschar, Adherent Slough Tendon Muscle Joint E Bone Ex or After Cleansing: No Fibrino Yes Exposed Structure Exposed: No er (Subcutaneous Tissue) Exposed: Yes Exposed: No Exposed: No xposed: No posed: No ion in Area: 0% ion in Volume: 0% alization: None ng: No ning: No Electronic Signature(s) Signed: 03/12/2020 5:30:47 PM By: Carlene Coria RN Entered By: Carlene Coria on 03/12/2020 11:17:47 -------------------------------------------------------------------------------- Wound Assessment Details Patient Name: Date of Service: ANTWAINE, BOOMHOWER 03/12/2020 10:30 AM Medical Record Number:[id number] Patient Account Number: [id number] Date of Birth/Sex: Treating RN: March 22, 1966 (53 y.o. Jerilynn Mages) Carlene Coria Primary Care Dilynn Munroe: Nathanial Rancher Other Clinician: Referring Deboraha Goar: Treating Pantelis Elgersma/Extender:Robson, Deirdre Pippins, OBED Weeks in Treatment: 0 Wound Status Wound Number: 2 Primary Venous Leg Ulcer Etiology: Wound Location: Right Foot Wound Open Wounding Event: Gradually Appeared Status: Date Acquired: 02/10/2020 Comorbid Arrhythmia, Congestive Heart Failure, Weeks Of Treatment: 0 History: Hypertension, End Stage Renal Disease Clustered Wound: Yes Photos Photo Uploaded By: Mikeal Hawthorne on 03/16/2020 10:05:48 Wound Measurements Length: (cm) 2.5 % Reduct Width: (cm) 9 % Reduct Depth: (cm) 0.1 Epitheli Area: (cm) 17.671 Tunneli Volume: (cm) 1.767 Undermi Wound Description Classification: Full Thickness Without Exposed Support Foul Od Structures Slough/ Exudate  Medium Amount: Exudate Serosanguineous Type: Exudate red, brown Color: Wound Bed Granulation Amount: Small (1-33%) Granulation Quality: Red, Pink Fascia Necrotic Amount: Large (67-100%) Fat Lay Necrotic Quality: Eschar, Adherent Slough Tendon Muscle Joint E Bone Ex or After Cleansing: No Fibrino Yes Exposed Structure Exposed: No er (Subcutaneous Tissue) Exposed: Yes Exposed: No Exposed: No xposed: No posed: No ion in Area: 0% ion in Volume: 0% alization: None ng: No ning: No Electronic Signature(s) Signed: 03/12/2020 5:30:47 PM By: Carlene Coria RN Entered By: Carlene Coria on 03/12/2020 11:17:48 -------------------------------------------------------------------------------- Wound Assessment Details Patient Name: Date of Service: [place], [place] 03/12/2020 10:30 AM Medical Record Number:[id number] Patient Account Number: [id number] Date of Birth/Sex: Treating RN: 05/03/1966 (53 y.o. Jerilynn Mages) Carlene Coria Primary Care Trameka Dorough: Other Clinician: Nathanial Rancher Referring Ardice Boyan: Treating Kayson Bullis/Extender:Robson, Deirdre Pippins, OBED Weeks in Treatment: 0 Wound Status Wound Number: 3 Primary Trauma, Other Etiology: Wound Location: Right Knee Wound Open Wounding Event: Gradually Appeared Status: Date Acquired: 02/03/2020 Comorbid Arrhythmia, Congestive Heart Failure, Weeks Of Treatment: 0 History: Hypertension, End Stage Renal Disease Clustered Wound: No Photos Wound Measurements Length: (cm) 2 % Reduct Width: (  cm) 4 % Reduct Depth: (cm) 0.1 Epitheli Area: (cm) 6.283 Tunneli Volume: (cm) 0.628 Undermi Wound Description Classification: Full Thickness Without Exposed Support Structures Exudate None Present Amount: Wound Bed Necrotic Amount: Large (67-100%) Necrotic Quality: Eschar Foul Odor After Cleansing: No Slough/Fibrino Yes Exposed Structure Fascia Exposed: No Fat Layer (Subcutaneous Tissue) Exposed: No Tendon Exposed: No Muscle Exposed: No Joint  Exposed: No Bone Exposed: No ion in Area: 0% ion in Volume: 0% alization: None ng: No ning: No Electronic Signature(s) Signed: 03/16/2020 4:56:09 PM By: Carlene Coria RN Signed: 03/19/2020 3:38:17 PM By: Mikeal Hawthorne EMT/HBOT Previous Signature: 03/12/2020 5:30:47 PM Version By: Carlene Coria RN Entered By: Mikeal Hawthorne on 03/16/2020 10:07:33 -------------------------------------------------------------------------------- Wound Assessment Details Patient Name: Date of Service: LEAF, KERNODLE 03/12/2020 10:30 AM Medical Record Number:[id number] Patient Account Number: [id number] Date of Birth/Sex: Treating RN: 06/24/1966 (53 y.o. Jerilynn Mages) Carlene Coria Primary Care Leanora Murin: Nathanial Rancher Other Clinician: Referring Constance Hackenberg: Treating Shauntay Brunelli/Extender:Robson, Deirdre Pippins, OBED Weeks in Treatment: 0 Wound Status Wound Number: 4 Primary Trauma, Other Etiology: Wound Location: Left Knee Wound Open Wounding Event: Trauma Status: Date Acquired: 02/03/2020 Comorbid Arrhythmia, Congestive Heart Failure, Weeks Of Treatment: 0 History: Hypertension, End Stage Renal Disease Clustered Wound: No Photos Wound Measurements Length: (cm) 1.9 % Reduct Width: (cm) 1.8 % Reduct Depth: (cm) 0.1 Epitheli Area: (cm) 2.686 Tunneli Volume: (cm) 0.269 Undermi Wound Description Full Thickness Without Exposed Support Classification: Structures Exudate None Present Amount: Wound Bed Granulation Amount: None Present (0%) Necrotic Amount: Large (67-100%) Necrotic Quality: Eschar Foul Odor After Cleansing: No Slough/Fibrino Yes Exposed Structure Fascia Exposed: No Fat Layer (Subcutaneous Tissue) Exposed: No Tendon Exposed: No Muscle Exposed: No Joint Exposed: No Bone Exposed: No ion in Area: 0% ion in Volume: 0% alization: None ng: No ning: No Electronic Signature(s) Signed: 03/16/2020 4:56:09 PM By: Carlene Coria RN Signed: 03/19/2020 3:38:17 PM By: Mikeal Hawthorne EMT/HBOT Previous Signature:  03/12/2020 5:30:47 PM Version By: Carlene Coria RN Entered By: Mikeal Hawthorne on 03/16/2020 10:07:06 -------------------------------------------------------------------------------- Vitals Details Patient Name: Date of Service: HOSEY, BURMESTER 03/12/2020 10:30 AM Medical Record Number:[id number] Patient Account Number: [id number] Date of Birth/Sex: Treating RN: 03-Feb-1966 (53 y.o. Jerilynn Mages) Dolores Lory, [place] Primary Care Velicia Dejager: Nathanial Rancher Other Clinician: Referring Mohd. Derflinger: Treating Semira Stoltzfus/Extender:Robson, Deirdre Pippins, OBED Weeks in Treatment: 0 Vital Signs Time Taken: 10:37 Temperature (F): 98.1 Height (in): 69 Pulse (bpm): 98 Source: Stated Respiratory Rate (breaths/min): 18 Weight (lbs): 141 Blood Pressure (mmHg): 145/89 Source: Stated Reference Range: 80 - 120 mg / dl Body Mass Index (BMI): 20.8 Electronic Signature(s) Signed: 03/12/2020 5:30:47 PM By: Carlene Coria RN Entered By: Carlene Coria on 03/12/2020 10:38:37

## 2020-03-12 NOTE — Progress Notes (Signed)
[name], [name] ([id number]) Visit Report for 03/12/2020 Abuse/Suicide Risk Screen Details Patient Name: Date of Service: [name] [name], [name] [name] 03/12/2020 10:30 AM Medical Record [id number] Patient Account Number: [id number] Date of Birth/Sex: Treating RN: 01/18/1966 (54 y.o. [name] [name]) Carlene Coria Primary Care Gilma Bessette: Nathanial Rancher Other Clinician: Referring Kashif Pooler: Treating Versie Soave/Extender:Robson, Deirdre Pippins, OBED Weeks in Treatment: 0 Abuse/Suicide Risk Screen Items Answer ABUSE RISK SCREEN: Has anyone close to you tried to hurt or harm you recentlyo No Do you feel uncomfortable with anyone in your familyo No Has anyone forced you do things that you didnt want to doo No Electronic Signature(s) Signed: 03/12/2020 5:30:47 PM By: Carlene Coria RN Entered By: Carlene Coria on 03/12/2020 10:47:13 -------------------------------------------------------------------------------- Activities of Daily Living Details Patient Name: Date of Service: [name] [name], [name] [name] 03/12/2020 10:30 AM Medical Record Number:[id number] Patient Account Number: [id number] Date of Birth/Sex: Treating RN: 1966/10/20 (53 y.o. [name] [name]) Carlene Coria Primary Care Pike Scantlebury: Nathanial Rancher Other Clinician: Referring Kishia Shackett: Treating Mylan Schwarz/Extender:Robson, Deirdre Pippins, OBED Weeks in Treatment: 0 Activities of Daily Living Items Answer Activities of Daily Living (Please select one for each item) Drive Automobile Not Able Take Medications Need Assistance Use Telephone Need Assistance Care for Appearance Need Assistance Use Toilet Need Assistance Bath / Shower Need Assistance Dress Self Need Assistance Feed Self Need Assistance Walk Need Assistance Get In / Out Bed Need Assistance Housework Need Assistance Prepare Meals Need Assistance Handle Money Need Assistance Shop for Self Need Assistance Electronic Signature(s) Signed: 03/12/2020 5:30:47 PM By: Carlene Coria RN Entered By: Carlene Coria on 03/12/2020  10:47:53 -------------------------------------------------------------------------------- Education Screening Details Patient Name: Date of Service: [name], [name] 03/12/2020 10:30 AM Medical Record Number:[id number] Patient Account Number: [id number] Date of Birth/Sex: Treating RN: 1966/07/02 (53 y.o. [name] [name]) Carlene Coria Primary Care Vilda Zollner: Nathanial Rancher Other Clinician: Referring Annjanette Wertenberger: Treating Seddrick Flax/Extender:Robson, Deirdre Pippins, OBED Weeks in Treatment: 0 Primary Learner Assessed: Patient Learning Preferences/Education Level/Primary Language Learning Preference: Explanation Highest Education Level: High School Preferred Language: English Cognitive Barrier Language Barrier: No Translator Needed: No Memory Deficit: No Emotional Barrier: No Cultural/Religious Beliefs Affecting Medical Care: No Physical Barrier Impaired Vision: No Impaired Hearing: No Decreased Hand dexterity: No Knowledge/Comprehension Knowledge Level: Medium Comprehension Level: High Ability to understand written High instructions: Ability to understand verbal High instructions: Motivation Anxiety Level: Calm Cooperation: Cooperative Education Importance: Acknowledges Need Interest in Health Problems: Asks Questions Perception: Coherent Willingness to Engage in Self- High Management Activities: Readiness to Engage in Self- High Management Activities: Electronic Signature(s) Signed: 03/12/2020 5:30:47 PM By: Carlene Coria RN Entered By: Carlene Coria on 03/12/2020 10:48:20 -------------------------------------------------------------------------------- Fall Risk Assessment Details Patient Name: Date of Service: [name] [name], [name] [name] 03/12/2020 10:30 AM Medical Record Number:[id number] Patient Account Number: [id number] Date of Birth/Sex: Treating RN: 02/12/1966 (53 y.o. [name] [name]) Carlene Coria Primary Care [place] Steidle: Nathanial Rancher Other Clinician: Referring Ronan Dion: Treating Jisela Merlino/Extender:Robson,  Deirdre Pippins, OBED Weeks in Treatment: 0 Fall Risk Assessment Items Have you had 2 or more falls in the last 12 monthso 0 Yes Have you had any fall that resulted in injury in the last 12 monthso 0 Yes FALLS RISK SCREEN History of falling - immediate or within 3 months 25 Yes Secondary diagnosis (Do you have 2 or more medical diagnoseso) 0 No Ambulatory aid None/bed rest/wheelchair/nurse 0 No Crutches/cane/walker 0 No Furniture 0 No Intravenous therapy Access/Saline/Heparin Lock 0 No Weak (short steps with or without shuffle, stooped but able to lift head 0 No while walking, may seek support from furniture) Impaired (short steps with shuffle, may have difficulty arising from chair, 0 No  head down, impaired balance) Mental Status Oriented to own ability 0 No Overestimates or forgets limitations 0 No Risk Level: Medium Risk Score: 25 Electronic Signature(s) Signed: 03/12/2020 5:30:47 PM By: Carlene Coria RN Entered By: Carlene Coria on 03/12/2020 10:48:47 -------------------------------------------------------------------------------- Foot Assessment Details Patient Name: Date of Service: [name] [name], [name] [name] 03/12/2020 10:30 AM Medical Record Number:[id number] Patient Account Number: [id number] Date of Birth/Sex: Treating RN: Jul 07, 1966 (53 y.o. [name] [name]) Carlene Coria Primary Care Giovannina Mun: Nathanial Rancher Other Clinician: Referring Jonny Longino: Treating Athalie Newhard/Extender:Robson, Deirdre Pippins, OBED Weeks in Treatment: 0 Foot Assessment Items Site Locations + = Sensation present, - = Sensation absent, C = Callus, U = Ulcer R = Redness, W = Warmth, M = Maceration, PU = Pre-ulcerative lesion F = Fissure, S = Swelling, D = Dryness Assessment Right: Left: Other Deformity: No No Prior Foot Ulcer: No No Prior Amputation: No No Charcot Joint: No No Ambulatory Status: Ambulatory With Help Assistance Device: Wheelchair Gait: Administrator, arts) Signed: 03/12/2020 5:30:47 PM By: Carlene Coria RN Entered By: Carlene Coria on 03/12/2020 10:52:07 -------------------------------------------------------------------------------- Nutrition Risk Screening Details Patient Name: Date of Service: [name] [name], [name] [name] 03/12/2020 10:30 AM Medical Record Number:[id number] Patient Account Number: [id number] Date of Birth/Sex: Treating RN: [birth year] (53 y.o. [name] [name]) Carlene Coria Primary Care Joi Leyva: Nathanial Rancher Other Clinician: Referring Johnathin Vanderschaaf: Treating Majel Giel/Extender:Robson, Deirdre Pippins, OBED Weeks in Treatment: 0 Height (in): 69 Weight (lbs): 141 Body Mass Index (BMI): 20.8 Nutrition Risk Screening Items Score Screening NUTRITION RISK SCREEN: I have an illness or condition that made me change the kind and/or 0 No amount of food I eat I eat fewer than two meals per day 0 No I eat few fruits and vegetables, or milk products 0 No I have three or more drinks of beer, liquor or wine almost every day 0 No I have tooth or mouth problems that make it hard for me to eat 0 No I don't always have enough money to buy the food I need 0 No I eat alone most of the time 0 No I take three or more different prescribed or over-the-counter drugs a day 1 Yes 2 Yes Without wanting to, I have lost or gained 10 pounds in the last six months I am not always physically able to shop, cook and/or feed myself 2 Yes Nutrition Protocols Good Risk Protocol Moderate Risk Protocol Provide education on High Risk Proctocol 0 nutrition Risk Level: Moderate Risk Score: 5 Electronic Signature(s) Signed: 03/12/2020 5:30:47 PM By: Carlene Coria RN Entered By: Carlene Coria on 03/12/2020 10:49:46

## 2020-03-13 ENCOUNTER — Telehealth: Payer: Self-pay | Admitting: *Deleted

## 2020-03-13 ENCOUNTER — Other Ambulatory Visit: Payer: Self-pay | Admitting: *Deleted

## 2020-03-13 DIAGNOSIS — N186 End stage renal disease: Secondary | ICD-10-CM | POA: Diagnosis not present

## 2020-03-13 DIAGNOSIS — R230 Cyanosis: Secondary | ICD-10-CM

## 2020-03-13 DIAGNOSIS — Z992 Dependence on renal dialysis: Secondary | ICD-10-CM | POA: Diagnosis not present

## 2020-03-13 DIAGNOSIS — D509 Iron deficiency anemia, unspecified: Secondary | ICD-10-CM | POA: Diagnosis not present

## 2020-03-13 DIAGNOSIS — N2581 Secondary hyperparathyroidism of renal origin: Secondary | ICD-10-CM | POA: Diagnosis not present

## 2020-03-13 NOTE — Telephone Encounter (Signed)
[name], [place], [place] left a message asking for verbal orders for HHPT 1wk1, 2wk6. Reviewed Dr. Dondra Spry last clinic note and telephone conversation with SW Loralee Pacas.  Verbal orders given per office protocol

## 2020-03-14 ENCOUNTER — Telehealth: Payer: Self-pay

## 2020-03-14 ENCOUNTER — Encounter: Payer: Self-pay | Admitting: Vascular Surgery

## 2020-03-14 ENCOUNTER — Ambulatory Visit (INDEPENDENT_AMBULATORY_CARE_PROVIDER_SITE_OTHER): Payer: Medicare Other | Admitting: Vascular Surgery

## 2020-03-14 ENCOUNTER — Other Ambulatory Visit: Payer: Self-pay

## 2020-03-14 ENCOUNTER — Ambulatory Visit (HOSPITAL_COMMUNITY)
Admission: RE | Admit: 2020-03-14 | Discharge: 2020-03-14 | Disposition: A | Discharge status: Home / Self Care | Payer: Medicare Other | Source: Ambulatory Visit | Attending: Surgery | Admitting: Surgery

## 2020-03-14 VITALS — BP 141/105 | HR 100 | Temp 97.4°F | Resp 20 | Ht 69.0 in | Wt 141.0 lb

## 2020-03-14 DIAGNOSIS — I96 Gangrene, not elsewhere classified: Secondary | ICD-10-CM

## 2020-03-14 DIAGNOSIS — R230 Cyanosis: Secondary | ICD-10-CM

## 2020-03-14 NOTE — Telephone Encounter (Signed)
SW received phone call from [place] with [facility] who inquired about when pt PEG tub e(placed on 01/09/2020) can be removed. SW informed being unaware on who ordered the PEG tube as not visible to SW. SW encouraged follow-up with Dr. Broadus [name] while in [place], and informed on upcoming appt 4/23 at 1am with Dr. Warnell Forester. SW encouraged her to also call vascular and interventional radiology to see if stere are any further updates. No further SW intervention.

## 2020-03-14 NOTE — Progress Notes (Signed)
Patient name: [name] [name] MRN: [id number] DOB: 1966/06/23 Sex: male  REASON FOR VISIT:   Gangrenous toes.  HPI:   [name] [name] is a pleasant 54 y.o. male who had a cardiac arrest in December of last year and was out for reportedly 26 minutes.  He was resuscitated and was on pressors for some time in the intensive care unit.  He was seen in consultation by Dr. Trula Slade on 12/21/2019.  He had extensive wounds of both feet but palpable dorsalis pedis pulses.  We made recommendations on wound care and wanted to let the toes demarcate.  [facility] and is now home with his father.  He is on home O2.  He is markedly debilitated.  He was seen at the wound care center and was having significant pain in his toes and therefore was added on today.  The patient complains of significant pain in all the toes in both feet.  His father has been doing the dressing changes on most days with the wound care team doing that twice a week.  He is limited to a wheelchair currently on home O2.  Current Outpatient Medications  Medication Sig Dispense Refill  . acetaminophen (TYLENOL) 325 MG tablet Take 1-2 tablets (325-650 mg total) by mouth every 4 (four) hours as needed for mild pain.    Marland Kitchen amiodarone (PACERONE) 200 MG tablet Take 1 tablet (200 mg total) by mouth daily. 30 tablet 1  . apixaban (ELIQUIS) 2.5 MG TABS tablet Take 1 tablet (2.5 mg total) by mouth 2 (two) times daily. 60 tablet 1  . benztropine (COGENTIN) 1 MG tablet Take 1 tablet (1 mg total) by mouth 2 (two) times daily. 60 tablet 1  . colchicine 0.6 MG tablet Take 1 tablet (0.6 mg total) by mouth daily. 30 tablet 1  . dextromethorphan-guaiFENesin (MUCINEX DM) 30-600 MG 12hr tablet Take 1 tablet by mouth 2 (two) times daily. 14 tablet 0  . docusate sodium (COLACE) 100 MG capsule Take 1 capsule (100 mg total) by mouth daily. 30 capsule 2  . folic acid (FOLVITE) 1 MG tablet Take 1 tablet (1 mg total) by mouth  daily. 30 tablet 1  . levothyroxine (SYNTHROID) 25 MCG tablet Take 1 tablet (25 mcg total) by mouth daily at 6 (six) AM. 30 tablet 1  . Melatonin 3 MG TABS Take 1 tablet (3 mg total) by mouth at bedtime. To help with sleep  0  . midodrine (PROAMATINE) 10 MG tablet Take 1 tablet (10 mg total) by mouth 3 (three) times daily with meals. 90 tablet 1  . multivitamin (RENA-VIT) TABS tablet Take 1 tablet by mouth at bedtime. 30 tablet 0  . predniSONE (DELTASONE) 10 MG tablet Take 1 tablet (10 mg total) by mouth daily with breakfast. 30 tablet 1  . pregabalin (LYRICA) 50 MG capsule Take 1 capsule (50 mg total) by mouth at bedtime. 30 capsule 1  . prochlorperazine (COMPAZINE) 5 MG tablet Take 1 tablet (5 mg total) by mouth every 6 (six) hours as needed for nausea or vomiting. 30 tablet 0  . senna-docusate (SENOKOT-S) 8.6-50 MG tablet Take 1 tablet by mouth at bedtime. 30 tablet 0  . thiamine 100 MG tablet Take 1 tablet (100 mg total) by mouth daily. 30 tablet 1  . traMADol (ULTRAM) 50 MG tablet Take 1 tablet (50 mg total) by mouth every 6 (six) hours as needed for moderate pain or severe pain. 120 tablet 0  .  vitamin C (ASCORBIC ACID) 250 MG tablet Take 1 tablet (250 mg total) by mouth 2 (two) times daily.    . Water For Irrigation, Sterile (FREE WATER) SOLN Place 100 mLs into feeding tube 2 (two) times daily. Use filtered or bottled water (No distilled water)     No current facility-administered medications for this visit.    REVIEW OF SYSTEMS:  [X]  denotes positive finding, [ ]  denotes negative finding Vascular    Leg swelling    Cardiac    Chest pain or chest pressure:    Shortness of breath upon exertion:    Short of breath when lying flat:    Irregular heart rhythm:    Constitutional    Fever or chills:     PHYSICAL EXAM:   Vitals:   03/14/20 1528  BP: (!) 141/105  Pulse: 100  Resp: 20  Temp: (!) 97.4 F (36.3 C)  SpO2: 100%  Weight: 141 lb (64 kg)  Height: 5\' 9"  (1.753 m)     GENERAL: The patient is a well-nourished male, in no acute distress. The vital signs are documented above. CARDIOVASCULAR: There is a regular rate and rhythm. PULMONARY: There is good air exchange bilaterally without wheezing or rales. VASCULAR: I do not detect carotid bruits. Physical exam was limited as the patient is in a wheelchair and was unable to get up on the table.  I could easily palpate femoral pulses which were normal.  I could not assess his popliteal pulses as he was in jeans and could not get those off.  I could not palpate pedal pulses although I was able to obtain fairly brisk Doppler signals in the dorsalis pedis and posterior tibial positions bilaterally.  Although the noninvasive study here in the office today show triphasic signals in the posterior tibial and dorsalis pedis positions I was unable to reproduce that with my Doppler. He has extensive gangrenous changes to all the toes on both feet as documented below.       DATA:   ARTERIAL DOPPLER STUDY: I have independently interpreted his arterial Doppler study today.  On the right side he has a triphasic dorsalis pedis and posterior tibial signal.  ABIs 100%.  Toe pressures could not be obtained.  On the left side he has a triphasic dorsalis pedis and posterior tibial signal.  ABI is 100%.  Toe pressures could not be obtained.  MEDICAL ISSUES:   GANGRENOUS TOES BOTH FEET: Patient has gangrenous changes to all the toes on both feet and is having significant pain.  Based on his noninvasive studies he should have adequate circulation to heal a TMA although on my exam I could not palpate pedal pulses which were present previously.  This may be secondary to limitations of examining the patient in a wheelchair.  Given the severity of his pain I offered bilateral transmetatarsal amputations given the extent of the wounds and also his pain.  However, the patient feels strongly about not amputating his toes at this point.   Therefore he would like to continue with dressing changes which I think is reasonable.  He is followed at the wound care center and his father does an excellent job of doing his dressing changes daily.  We will have him follow-up with Dr. Trula Slade who saw him in the hospital to follow this closely.  Certainly if the toes progress he could require transmetatarsal amputations.   Deitra Mayo [facility] [phone]

## 2020-03-15 ENCOUNTER — Telehealth: Payer: Self-pay | Admitting: Internal Medicine

## 2020-03-15 DIAGNOSIS — I120 Hypertensive chronic kidney disease with stage 5 chronic kidney disease or end stage renal disease: Secondary | ICD-10-CM | POA: Diagnosis not present

## 2020-03-15 DIAGNOSIS — Z992 Dependence on renal dialysis: Secondary | ICD-10-CM | POA: Diagnosis not present

## 2020-03-15 DIAGNOSIS — N186 End stage renal disease: Secondary | ICD-10-CM | POA: Diagnosis not present

## 2020-03-15 DIAGNOSIS — N2581 Secondary hyperparathyroidism of renal origin: Secondary | ICD-10-CM | POA: Diagnosis not present

## 2020-03-15 NOTE — Telephone Encounter (Signed)
Per Benjamine Mola @ [facility] VO [phone]

## 2020-03-15 NOTE — Telephone Encounter (Signed)
Received TC from [employer] at [facility] requesting VO for patient.  Informed Benjamine Mola that pt has not been seen since 10/2018 and [facility] would not be able to give any VO.  Advised to contact Dr. Naaman Plummer office.  (Please see telephone note from 03/01/20). SChaplin, RN,BSN

## 2020-03-17 DIAGNOSIS — Z992 Dependence on renal dialysis: Secondary | ICD-10-CM | POA: Diagnosis not present

## 2020-03-17 DIAGNOSIS — N2581 Secondary hyperparathyroidism of renal origin: Secondary | ICD-10-CM | POA: Diagnosis not present

## 2020-03-17 DIAGNOSIS — N186 End stage renal disease: Secondary | ICD-10-CM | POA: Diagnosis not present

## 2020-03-19 ENCOUNTER — Other Ambulatory Visit: Payer: Self-pay

## 2020-03-19 ENCOUNTER — Encounter (HOSPITAL_BASED_OUTPATIENT_CLINIC_OR_DEPARTMENT_OTHER): Payer: Medicare Other | Attending: Internal Medicine | Admitting: Internal Medicine

## 2020-03-19 ENCOUNTER — Other Ambulatory Visit (HOSPITAL_COMMUNITY): Payer: Self-pay | Admitting: Interventional Radiology

## 2020-03-19 DIAGNOSIS — Z992 Dependence on renal dialysis: Secondary | ICD-10-CM | POA: Insufficient documentation

## 2020-03-19 DIAGNOSIS — I429 Cardiomyopathy, unspecified: Secondary | ICD-10-CM | POA: Diagnosis not present

## 2020-03-19 DIAGNOSIS — I4891 Unspecified atrial fibrillation: Secondary | ICD-10-CM | POA: Diagnosis not present

## 2020-03-19 DIAGNOSIS — N186 End stage renal disease: Secondary | ICD-10-CM | POA: Insufficient documentation

## 2020-03-19 DIAGNOSIS — I70263 Atherosclerosis of native arteries of extremities with gangrene, bilateral legs: Secondary | ICD-10-CM | POA: Insufficient documentation

## 2020-03-19 DIAGNOSIS — I70261 Atherosclerosis of native arteries of extremities with gangrene, right leg: Secondary | ICD-10-CM | POA: Diagnosis not present

## 2020-03-19 DIAGNOSIS — Z7901 Long term (current) use of anticoagulants: Secondary | ICD-10-CM | POA: Insufficient documentation

## 2020-03-19 DIAGNOSIS — Z931 Gastrostomy status: Secondary | ICD-10-CM

## 2020-03-19 DIAGNOSIS — I70262 Atherosclerosis of native arteries of extremities with gangrene, left leg: Secondary | ICD-10-CM | POA: Diagnosis not present

## 2020-03-19 NOTE — Progress Notes (Signed)
EBERT, FORRESTER ([id number]) Visit Report for 03/19/2020 HPI Details Patient Name: Date of Service: RIEL, HIRSCHMAN 03/19/2020 2:00 PM Medical Record [id number] Patient Account Number: [id number] Date of Birth/Sex: Treating RN: January 25, 1966 (54 y.o. Janyth Contes Primary Care Provider: Nathanial Rancher Other Clinician: Referring Provider: Treating Provider/Extender:Vonte Rossin, Deirdre Pippins, OBED Weeks in Treatment: 1 History of Present Illness HPI Description: ADMISSION 03/12/2020 This is an unfortunate 54 year old man who has multiple medical issues. He has a nonischemic cardiomyopathy which is severe with an ejection fraction of 20%. He also has chronic renal failure on dialysis Tuesday Thursday and Saturday. He is not a diabetic. The problem apparently started on 11/16/2019 he had a cardiac arrest at dialysis which was apparently a PEA cardiac arrest. He received CPR from prolonged period of time. He was admitted to hospital found to have PE and A. fib. He also had a ventricular fib cardiac arrest during the hospitalization and was noted to have a pericardial effusion. During the course of a long stay I think in the ICU he was noted to have gangrenous changes of his toes. He was seen by Dr. Trula Slade on 12/18. At the time it was felt that he had brisk Doppler signals. He was also either at that time or before on pressors which included Levophed. He apparently refused amputation of his toes. He went on to spend a prolonged period of time in rehab noted have gangrene of the toes as well. He has home health changing the dressing which is father also does which is a Betadine wet-to-dry. Past medical history includes a nonischemic cardiomyopathy, chronic renal failure on dialysis, pleural effusion, atrial fibrillation on Eliquis, polysubstance abuse. ABIs in our clinic were 1.1 on the right and 0.76 on the left 4/5; the patient saw Dr. Doren Custard on 03/14/2020. He has a follow-up with Dr. Ramon Dredge him on  4/5. He had arterial Dopplers done on that day. His ABIs were 100%. He had triphasic dorsalis pedis and posterior tibial. On the left side he had triphasic dorsalis pedis and posterior tibial signal ABI at 100%. Toe pressures could not be obtained on either side. 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 There is extensive wounds on his dorsal first toe bilaterally. Left greater than right. His forefoot is cold and extremely painful. Interestingly he also has eschared areas on both patellae which are also cold. Electronic Signature(s) Signed: 03/19/2020 5:24:04 PM By: Linton Ham MD Entered By: Linton Ham on 03/19/2020 15:12:11 -------------------------------------------------------------------------------- Physical Exam Details Patient Name: Date of Service: CLEVEN, JANSMA 03/19/2020 2:00 PM Medical Record Number:[id number] Patient Account Number: [id number] Date of Birth/Sex: Treating RN: 1966/05/23 (54 y.o. Janyth Contes Primary Care Provider: Nathanial Rancher Other Clinician: Referring Provider: Treating Provider/Extender:Hisashi Amadon, Deirdre Pippins, OBED Weeks in Treatment: 1 Constitutional Sitting or standing Blood Pressure is within target range for patient.. Pulse regular and within target range for patient.Marland Kitchen Respirations regular, non-labored and within target range.. Temperature is normal and within the target range for the patient.Marland Kitchen Appears in no distress however extremely frail. Oxygen on. Cardiovascular I could not feel his femoral or popliteal pulse. Dorsalis pedis pulses were palpable. But not the posterior tibia. Notes Wound exam; every toe has dry gangrene. Some more viable wound areas on the dorsal left and right first. His forefeet bilaterally are very cold. Dry excoriated areas on the anterior part of both patella. I am not sure these are open but these areas are also very cool which is somewhat puzzling. Electronic Signature(s) Signed:  03/19/2020 5:24:04 PM By: Linton Ham MD Entered By: Linton Ham on 03/19/2020 15:13:38 -------------------------------------------------------------------------------- Physician Orders Details Patient Name: Date of Service: DETRAVION, TESTER 03/19/2020 2:00 PM Medical Record Number:[id number] Patient Account Number: [id number] Date of Birth/Sex: Treating RN: 04/01/1966 (54 y.o. 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 Dressing Change Frequency Change dressing every day. - all wounds Wound Cleansing Clean wound with Normal Saline. Primary Wound Dressing Wound #1 Left Foot Other: - wrap toes with betadine moistened gauze Wound #2 Right Foot Other: - wrap toes with betadine moistened gauze Wound #3 Right Knee Other: - paint with betadine Wound #4 Left Knee Other: - paint with betadine Secondary Dressing Wound #1 Left Foot Kerlix/Rolled Gauze Dry Gauze Wound #2 Right Foot Kerlix/Rolled Gauze Dry [facility] skilled nursing for wound care. - [facility] to change twice a week. family and/or patient to change all other days. Electronic Signature(s) Signed: 03/19/2020 5:09:30 PM By: Levan Hurst RN, BSN Signed: 03/19/2020 5:24:04 PM By: Linton Ham MD Entered By: Levan Hurst on 03/19/2020  14:38:50 -------------------------------------------------------------------------------- Problem List Details Patient Name: Date of Service: CHESTON, COURY 03/19/2020 2:00 PM Medical Record Number:[id number] Patient Account Number: [id number] Date of Birth/Sex: Treating RN: 10-18-1966 (54 y.o. Janyth Contes Primary Care Provider: Nathanial Rancher Other Clinician: Referring Provider: Treating Provider/Extender:Brittnae Aschenbrenner, Deirdre Pippins, OBED Weeks in Treatment: 1 Active Problems ICD-10 Evaluated Encounter Code Description Active Date Today Diagnosis I70.235 Atherosclerosis of native arteries of right leg with 03/12/2020 No Yes ulceration of other part of foot I70.245 Atherosclerosis of native arteries of left leg with 03/12/2020 No Yes ulceration of other part of foot I70.261 Atherosclerosis of native arteries of extremities with 03/12/2020 No Yes gangrene, right leg I70.262 Atherosclerosis of native arteries of extremities with 03/12/2020 No Yes gangrene, left leg I42.9 Cardiomyopathy, unspecified 03/12/2020 No Yes Inactive Problems Resolved Problems Electronic Signature(s) Signed: 03/19/2020 5:24:04 PM By: Linton Ham MD Entered By: Linton Ham on 03/19/2020 15:10:01 -------------------------------------------------------------------------------- Progress Note Details Patient Name: Date of Service: TRUONG, DELCASTILLO 03/19/2020 2:00 PM Medical Record Number:[id number] Patient Account Number: [id number] Date of Birth/Sex: Treating RN: 1966-01-17 (54 y.o. Janyth Contes Primary Care Provider: Nathanial Rancher Other Clinician: Referring Provider: Treating Provider/Extender:Josealfredo Adkins, Deirdre Pippins, OBED Weeks in Treatment: 1 Subjective History of Present Illness (HPI) ADMISSION 03/12/2020 This is an unfortunate 54 year old man who has multiple medical issues. He has a nonischemic cardiomyopathy which is severe with an ejection fraction of 20%. He also has chronic renal failure on  dialysis Tuesday Thursday and Saturday. He is not a diabetic. The problem apparently started on 11/16/2019 he had a cardiac arrest at dialysis which was apparently a PEA cardiac arrest. He received CPR from prolonged period of time. He was admitted to hospital found to have PE and A. fib. He also had a ventricular fib cardiac arrest during the hospitalization and was noted to have a pericardial effusion. During the course of a long stay I think in the ICU he was noted to have  gangrenous changes of his toes. He was seen by Dr. Trula Slade on 12/18. At the time it was felt that he had brisk Doppler signals. He was also either at that time or before on pressors which included Levophed. He apparently refused amputation of his toes. He went on to spend a prolonged period of time in rehab noted have gangrene of the toes as well. He has home health changing the dressing which is father also does which is a Betadine wet-to-dry. Past medical history includes a nonischemic cardiomyopathy, chronic renal failure on dialysis, pleural effusion, atrial fibrillation on Eliquis, polysubstance abuse. ABIs in our clinic were 1.1 on the right and 0.76 on the left 4/5; the patient saw Dr. Doren Custard on 03/14/2020. He has a follow-up with Dr. Ramon Dredge him on 4/5. He had arterial Dopplers done on that day. His ABIs were 100%. He had triphasic dorsalis pedis and posterior tibial. On the left side he had triphasic dorsalis pedis and posterior tibial signal ABI at 100%. Toe pressures could not be obtained on either side. 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 There is extensive wounds on his dorsal first toe bilaterally. Left greater than right. His forefoot is cold and extremely painful. Interestingly he also has eschared areas on both patellae which are also cold. Objective Constitutional Sitting or standing Blood Pressure is within target range for patient.. Pulse regular and within target range  for patient.Marland Kitchen Respirations regular, non-labored and within target range.. Temperature is normal and within the target range for the patient.Marland Kitchen Appears in no distress however extremely frail. Oxygen on. Vitals Time Taken: 1:25 PM, Height: 69 in, Weight: 141 lbs, BMI: 20.8, Temperature: 97.6 F, Pulse: 67 bpm, Respiratory Rate: 19 breaths/min, Blood Pressure: 114/77 mmHg. Cardiovascular I could not feel his femoral or popliteal pulse. Dorsalis pedis pulses were palpable. But not the posterior tibia. General Notes: Wound exam; every toe has dry gangrene. Some more viable wound areas on the dorsal left and right first. His forefeet bilaterally are very cold. Dry excoriated areas on the anterior part of both patella. I am not sure these are open but these areas are also very cool which is somewhat puzzling. Integumentary (Hair, Skin) Wound #1 status is Open. Original cause of wound was Gradually Appeared. The wound is located on the Left Foot. The wound measures 8cm length x 12cm width x 0.1cm depth; 75.398cm^2 area and 7.54cm^3 volume. There is Fat Layer (Subcutaneous Tissue) Exposed exposed. There is no tunneling or undermining noted. There is a medium amount of serous drainage noted. The wound margin is distinct with the outline attached to the wound base. There is small (1-33%) red, pink granulation within the wound bed. There is a large (67-100%) amount of necrotic tissue within the wound bed including Eschar and Adherent Slough. Wound #2 status is Open. Original cause of wound was Gradually Appeared. The wound is located on the Right Foot. The wound measures 4cm length x 11cm width x 0.1cm depth; 34.558cm^2 area and 3.456cm^3 volume. There is Fat Layer (Subcutaneous Tissue) Exposed exposed. There is no tunneling or undermining noted. There is a medium amount of serous drainage noted. The wound margin is distinct with the outline attached to the wound base. There is small (1-33%) red, pink  granulation within the wound bed. There is a large (67-100%) amount of necrotic tissue within the wound bed including Eschar and Adherent Slough. Wound #3 status is Open. Original cause of wound was Gradually Appeared. The wound  is located on the Right Knee. The wound measures 2.5cm length x 4cm width x 0.1cm depth; 7.854cm^2 area and 0.785cm^3 volume. There is no tunneling or undermining noted. There is a none present amount of drainage noted. The wound margin is distinct with the outline attached to the wound base. There is no granulation within the wound bed. There is a large (67-100%) amount of necrotic tissue within the wound bed including Eschar. Wound #4 status is Open. Original cause of wound was Trauma. The wound is located on the Left Knee. The wound measures 2cm length x 3cm width x 0.1cm depth; 4.712cm^2 area and 0.471cm^3 volume. There is no tunneling or undermining noted. There is a none present amount of drainage noted. The wound margin is distinct with the outline attached to the wound base. There is no granulation within the wound bed. There is a large (67-100%) amount of necrotic tissue within the wound bed including Eschar. 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 Dressing Change Frequency: Change dressing every day. - all wounds Wound Cleansing: Clean wound with Normal Saline. Primary Wound Dressing: Wound #1 Left Foot: Other: - wrap toes with betadine moistened gauze Wound #2 Right Foot: Other: - wrap toes with betadine moistened gauze Wound #3 Right Knee: Other: -  paint with betadine Wound #4 Left Knee: Other: - paint with betadine Secondary Dressing: Wound #1 Left Foot: Kerlix/Rolled Gauze Dry Gauze Wound #2 Right Foot: Kerlix/Rolled Gauze Dry Gauze Home Health: [facility] skilled [facility] for wound care. - [facility] to change twice a week. family and/or patient to change all other days. 1. We are using Betadine with dry gauze 2. I went over with him Dr. Mee Hives rationale and offering a transmetatarsal amputation. Neither one of them seem really receptive but I am not really sure they understood the rationale at all. I emphasized the point that the pain seems to be excruciating [ranked a 10 out of 10] however they I think you are looking for something that probably is not going to happen. I am doubtful that he has enough blood flow in his toes to heal any of thiso Microvascular ischemia 3. They see Dr. Trula Slade on Monday. Beyond that I am hopeful that he will be able to explain things a little better than I was able to today. Electronic Signature(s) Signed: 03/19/2020 5:24:04 PM By: Linton Ham MD Entered By: Linton Ham on 03/19/2020 15:15:39 -------------------------------------------------------------------------------- SuperBill Details Patient Name: Date of Service: RAIN, FRIEDT 03/19/2020 Medical Record [id number] Patient Account Number: [id number] Date of Birth/Sex: Treating RN: Aug 10, 1966 (54 y.o. 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, right leg I70.262  Atherosclerosis of native arteries of extremities with gangrene, left leg I42.9 Cardiomyopathy, unspecified Facility Procedures CPT4 Code: [id number] Description: 09735 - WOUND CARE VISIT-LEV 5 EST PT Modifier: Quantity: 1 Physician Procedures CPT4 Code  Description: [id number] - WC PHYS LEVEL 3 - EST PT ICD-10 Diagnosis Description I70.261 Atherosclerosis of native arteries of extremities with gangre I70.262 Atherosclerosis of native arteries of extremities with gangre Modifier: ne, right leg ne, left leg Quantity: 1 Electronic Signature(s) Signed: 03/19/2020 5:24:04 PM By: Linton Ham MD Entered By: Linton Ham on 03/19/2020 15:15:57

## 2020-03-19 NOTE — Progress Notes (Signed)
[name] [name] ([id number]) Visit Report for 03/19/2020 Arrival Information Details Patient Name: Date of Service: [name] [name], [name] [name] 03/19/2020 2:00 PM Medical Record [id number] Patient Account Number: [id number] Date of Birth/Sex: Treating RN: 08-04-1966 (54 y.o. Marvis Repress Primary Care Abdallah Hern: Nathanial Rancher Other Clinician: Referring Breniya Goertzen: Treating Siani Utke/Extender:Robson, Deirdre Pippins, OBED Weeks in Treatment: 1 Visit Information History Since Last Visit Added or deleted any medications: No Patient Arrived: Wheel Chair Any new allergies or adverse reactions: No Arrival Time: 13:29 Had a fall or experienced change in No Accompanied By: family member activities of daily living that may affect Transfer Assistance: EasyPivot Patient risk of falls: Lift Signs or symptoms of abuse/neglect since last No Patient Identification Verified: Yes visito Secondary Verification Process Yes Hospitalized since last visit: No Completed: Implantable device outside of the clinic excluding No Patient Has Alerts: Yes cellular tissue based products placed in the center Patient Alerts: Patient on Blood since last visit: Thinner Has Dressing in Place as Prescribed: Yes Pain Present Now: Yes Electronic Signature(s) Signed: 03/19/2020 4:55:48 PM By: Kela Millin Entered By: Kela Millin on 03/19/2020 13:39:53 -------------------------------------------------------------------------------- Clinic Level of Care Assessment Details Patient Name: Date of Service: [name] [name], [name] [name] 03/19/2020 2:00 PM Medical Record Number:[id number] Patient Account Number: [id number] Date of Birth/Sex: Treating RN: 1966-12-15 (55 y.o. [name] [name] Primary Care Domenique Quest: Nathanial Rancher Other Clinician: Referring Antigone Crowell: Treating Teniola Tseng/Extender:Robson, Deirdre Pippins, OBED Weeks in Treatment: 1 Clinic Level of Care Assessment Items TOOL 4 Quantity Score X - Use when only an EandM is  performed on FOLLOW-UP visit 1 0 ASSESSMENTS - Nursing Assessment / Reassessment X - Reassessment of Co-morbidities (includes updates in patient status) 1 10 X - Reassessment of Adherence to Treatment Plan 1 5 ASSESSMENTS - Wound and Skin Assessment / Reassessment []  - Simple Wound Assessment / Reassessment - one wound 0 X - Complex Wound Assessment / Reassessment - multiple wounds 4 5 []  - Dermatologic / Skin Assessment (not related to wound area) 0 ASSESSMENTS - Focused Assessment []  - Circumferential Edema Measurements - multi extremities 0 []  - Nutritional Assessment / Counseling / Intervention 0 X - Lower Extremity Assessment (monofilament, tuning fork, pulses) 1 5 []  - Peripheral Arterial Disease Assessment (using hand held doppler) 0 ASSESSMENTS - Ostomy and/or Continence Assessment and Care []  - Incontinence Assessment and Management 0 []  - Ostomy Care Assessment and Management (repouching, etc.) 0 PROCESS - Coordination of Care X - Simple Patient / Family Education for ongoing care 1 15 []  - Complex (extensive) Patient / Family Education for ongoing care 0 X - Staff obtains Programmer, systems, Records, Test Results / Process Orders 1 10 X - Staff telephones HHA, Nursing Homes / Clarify orders / etc 1 10 []  - Routine Transfer to another Facility (non-emergent condition) 0 []  - Routine Hospital Admission (non-emergent condition) 0 []  - New Admissions / Biomedical engineer / Ordering NPWT, Apligraf, etc. 0 []  - Emergency Hospital Admission (emergent condition) 0 X - Simple Discharge Coordination 1 10 []  - Complex (extensive) Discharge Coordination 0 PROCESS - Special Needs []  - Pediatric / Minor Patient Management 0 []  - Isolation Patient Management 0 []  - Hearing / Language / Visual special needs 0 []  - Assessment of Community assistance (transportation, D/C planning, etc.) 0 []  - Additional assistance / Altered mentation 0 []  - Support Surface(s) Assessment (bed, cushion, seat,  etc.) 0 INTERVENTIONS - Wound Cleansing / Measurement []  - Simple Wound Cleansing - one wound 0 X - Complex Wound Cleansing - multiple wounds 4 5 X -  Wound Imaging (photographs - any number of wounds) 1 5 []  - Wound Tracing (instead of photographs) 0 []  - Simple Wound Measurement - one wound 0 X - Complex Wound Measurement - multiple wounds 4 5 INTERVENTIONS - Wound Dressings []  - Small Wound Dressing one or multiple wounds 0 X - Medium Wound Dressing one or multiple wounds 2 15 []  - Large Wound Dressing one or multiple wounds 0 X - Application of Medications - topical 1 5 []  - Application of Medications - injection 0 INTERVENTIONS - Miscellaneous []  - External ear exam 0 []  - Specimen Collection (cultures, biopsies, blood, body fluids, etc.) 0 []  - Specimen(s) / Culture(s) sent or taken to Lab for analysis 0 []  - Patient Transfer (multiple staff / Civil Service fast streamer / Similar devices) 0 []  - Simple Staple / Suture removal (25 or less) 0 []  - Complex Staple / Suture removal (26 or more) 0 []  - Hypo / Hyperglycemic Management (close monitor of Blood Glucose) 0 []  - Ankle / Brachial Index (ABI) - do not check if billed separately 0 X - Vital Signs 1 5 Has the patient been seen at the hospital within the last three years: Yes Total Score: 170 Level Of Care: New/Established - Level 5 Electronic Signature(s) Signed: 03/19/2020 5:09:30 PM By: Levan Hurst RN, BSN Entered By: Levan Hurst on 03/19/2020 14:40:02 -------------------------------------------------------------------------------- Encounter Discharge Information Details Patient Name: Date of Service: [name] [name] 03/19/2020 2:00 PM Medical Record Number:[id number] Patient Account Number: [id number] Date of Birth/Sex: Treating RN: [birth year] (54 y.o. Marvis Repress Primary Care Lauryl Seyer: Nathanial Rancher Other Clinician: Referring Loleta Frommelt: Treating Korayma Hagwood/Extender:Robson, Deirdre Pippins, OBED Weeks in Treatment: 1 Encounter  Discharge Information Items Discharge Condition: Stable Ambulatory Status: Wheelchair Discharge Destination: Home Transportation: Private Auto Accompanied By: family member Schedule Follow-up Appointment: Yes Clinical Summary of Care: Patient Declined Electronic Signature(s) Signed: 03/19/2020 4:55:48 PM By: Kela Millin Entered By: Kela Millin on 03/19/2020 14:46:30 -------------------------------------------------------------------------------- Lower Extremity Assessment Details Patient Name: Date of Service: CARTEL, MAUSS 03/19/2020 2:00 PM Medical Record Number:[id number] Patient Account Number: [id number] Date of Birth/Sex: Treating RN: 26-May-1966 (54 y.o. Marvis Repress Primary Care Nyzier Boivin: Nathanial Rancher Other Clinician: Referring Eydan Chianese: Treating Easton Sivertson/Extender:Robson, Deirdre Pippins, OBED Weeks in Treatment: 1 Edema Assessment Assessed: [Left: No] [Right: No] E[Left: dema] [Right: :] Calf Left: Right: Point of Measurement: 42 cm From Medial Instep 3 cm 31.5 cm Ankle Left: Right: Point of Measurement: 11 cm From Medial Instep 24 cm 22.5 cm Vascular Assessment Pulses: Dorsalis Pedis Palpable: [Left:No] [Right:No] Electronic Signature(s) Signed: 03/19/2020 4:55:48 PM By: Kela Millin Entered By: Kela Millin on 03/19/2020 13:39:36 -------------------------------------------------------------------------------- Multi Wound Chart Details Patient Name: Date of Service: [name] [name], [name] [name] 03/19/2020 2:00 PM Medical Record Number:[id number] Patient Account Number: [id number] Date of Birth/Sex: Treating RN: 08-20-1966 (55 y.o. [name] [name] Primary Care Ronnett Pullin: Nathanial Rancher Other Clinician: Referring Lorissa Kishbaugh: Treating Debarah Mccumbers/Extender:Robson, Deirdre Pippins, OBED Weeks in Treatment: 1 Vital Signs Height(in): 69 Pulse(bpm): 36 Weight(lbs): 141 Blood Pressure(mmHg): 114/77 Body Mass Index(BMI): 21 Temperature(F): 97.6 Respiratory  19 Rate(breaths/min): Photos: [1:No Photos] [2:No Photos] [3:No Photos] Wound Location: [1:Left Foot] [2:Right Foot] [3:Right Knee] Wounding Event: [1:Gradually Appeared] [2:Gradually Appeared] [3:Gradually Appeared] Primary Etiology: [1:Venous Leg Ulcer] [2:Venous Leg Ulcer] [3:Trauma, Other] Comorbid History: [1:Arrhythmia, Congestive Heart Failure, Hypertension, Heart Failure, Hypertension, Heart Failure, Hypertension, End Stage Renal Disease End Stage Renal Disease End Stage Renal Disease] [2:Arrhythmia, Congestive] [3:Arrhythmia,  Congestive] Date Acquired: [1:02/10/2020] [2:02/10/2020] [3:02/03/2020] Weeks of Treatment: [1:1] [2:1] [3:1] Wound Status: [1:Open] [2:Open] [3:Open] Clustered Wound: [1:Yes] [  2:Yes] [3:No] Measurements L x W x D 8x12x0.1 [2:4x11x0.1] [3:2.5x4x0.1] (cm) Area (cm) : [1:75.398] [2:34.558] [3:7.854] Volume (cm) : [1:7.54] [2:3.456] [3:0.785] % Reduction in Area: [1:-6.70%] [2:-95.60%] [3:-25.00%] % Reduction in Volume: -6.70% [2:-95.60%] [3:-25.00%] Classification: [1:Full Thickness Without Exposed Support Structures Exposed Support Structures Exposed Support Structures] [2:Full Thickness Without] [3:Full Thickness Without] Exudate Amount: [1:Medium] [2:Medium] [3:None Present] Exudate Type: [1:Serous] [2:Serous] [3:N/A] Exudate Color: [1:amber] [2:amber] [3:N/A] Wound Margin: [1:Distinct, outline attached Distinct, outline attached Distinct, outline attached] Granulation Amount: [1:Small (1-33%)] [2:Small (1-33%)] [3:None Present (0%)] Granulation Quality: [1:Red, Pink] [2:Red, Pink] [3:N/A] Necrotic Amount: [1:Large (67-100%)] [2:Large (67-100%)] [3:Large (67-100%)] Necrotic Tissue: [1:Eschar, Adherent Slough [place], [place] Exposed Structures: [1:Fat Layer (Subcutaneous Fat Layer (Subcutaneous Fascia: No Tissue) Exposed: Yes Fascia: No Tendon: No Muscle: No Joint: No Bone: No] [2:Tissue) Exposed: Yes Fascia: No Tendon: No Muscle: No Joint:  No Bone: No] [3:Fat Layer (Subcutaneous Tissue)  Exposed: No Tendon: No Muscle: No Joint: No Bone: No] Epithelialization: [1:None 4] [2:None N/A] [3:None N/A] Photos: [1:No Photos] [2:N/A] [3:N/A] Wound Location: [1:Left Knee] [2:N/A] [3:N/A] Wounding Event: [1:Trauma] [2:N/A] [3:N/A] Primary Etiology: [1:Trauma, Other] [2:N/A] [3:N/A] Comorbid History: [1:Arrhythmia, Congestive Heart Failure, Hypertension, End Stage Renal Disease] [2:N/A] [3:N/A] Date Acquired: [1:02/03/2020] [2:N/A] [3:N/A] Weeks of Treatment: [1:1] [2:N/A] [3:N/A] Wound Status: [1:Open] [2:N/A] [3:N/A] Clustered Wound: [1:No] [2:N/A] [3:N/A] Measurements L x W x D 2x3x0.1 [2:N/A] [3:N/A] (cm) Area (cm) : [1:4.712] [2:N/A] [3:N/A] Volume (cm) : [1:0.471] [2:N/A] [3:N/A] % Reduction in Area: [1:-75.40%] [2:N/A] [3:N/A] % Reduction in Volume: -75.10% [2:N/A] [3:N/A] Classification: [1:Full Thickness Without Exposed Support Structures] [2:N/A] [3:N/A] Exudate Amount: [1:None Present] [2:N/A] [3:N/A] Exudate Type: [1:N/A] [2:N/A] [3:N/A] Exudate Color: [1:N/A] [2:N/A] [3:N/A] Wound Margin: [1:Distinct, outline attached N/A] [3:N/A] Granulation Amount: [1:None Present (0%)] [2:N/A] [3:N/A] Granulation Quality: [1:N/A] [2:N/A] [3:N/A] Necrotic Amount: [1:Large (67-100%)] [2:N/A] [3:N/A] Necrotic Tissue: [1:Eschar] [2:N/A] [3:N/A] Exposed Structures: [1:Fascia: No Fat Layer (Subcutaneous Tissue) Exposed: No Tendon: No Muscle: No Joint: No Bone: No None] [2:N/A N/A] [3:N/A N/A] Treatment Notes Wound #1 (Left Foot) 1. Cleanse With Wound Cleanser 3. Primary Dressing Applied Other primary dressing (specifiy in notes) 4. Secondary Dressing Roll Gauze 5. Secured With Tape Notes betadine moistened gauze Wound #2 (Right Foot) 1. Cleanse With Wound Cleanser 3. Primary Dressing Applied Other primary dressing (specifiy in notes) 4. Secondary Dressing Roll Gauze 5. Secured With Tape Notes betadine moistened  gauze Wound #3 (Right Knee) 1. Cleanse With Wound Cleanser 3. Primary Dressing Applied Other primary dressing (specifiy in notes) 4. Secondary Dressing Roll Gauze 5. Secured With Tape Notes betadine moistened gauze Wound #4 (Left Knee) 1. Cleanse With Wound Cleanser 3. Primary Dressing Applied Other primary dressing (specifiy in notes) 4. Secondary Dressing Roll Gauze 5. Secured With Tape Notes betadine moistened gauze Electronic Signature(s) Signed: 03/19/2020 5:09:30 PM By: Levan Hurst RN, BSN Signed: 03/19/2020 5:24:04 PM By: Linton Ham MD Entered By: Linton Ham on 03/19/2020 15:10:32 -------------------------------------------------------------------------------- Multi-Disciplinary Care Plan Details Patient Name: Date of Service: NEIMAN, ROOTS 03/19/2020 2:00 PM Medical Record Number:[id number] Patient Account Number: [id number] Date of Birth/Sex: Treating RN: October 30, 1966 (54 y.o. [name] [name] Primary Care Sohana Austell: Nathanial Rancher Other Clinician: Referring Adaijah Endres: Treating Hebah Bogosian/Extender:Robson, Deirdre Pippins, OBED Weeks in Treatment: 1 Active Inactive Abuse / Safety / Falls / Self Care Management Nursing Diagnoses: Potential for falls Goals: Patient/caregiver will verbalize/demonstrate measures taken to prevent injury and/or falls Date Initiated: 03/12/2020 Target Resolution Date: 04/11/2020 Goal Status: Active Interventions: Assess fall risk on admission and as needed  Assess impairment of mobility on admission and as needed per policy Notes: Necrotic Tissue Nursing Diagnoses: Impaired tissue integrity related to necrotic/devitalized tissue Knowledge deficit related to management of necrotic/devitalized tissue Goals: Necrotic/devitalized tissue will be minimized in the wound bed Date Initiated: 03/12/2020 Target Resolution Date: 04/09/2020 Goal Status: Active Patient/caregiver will verbalize understanding of reason and process for  debridement of necrotic tissue Date Initiated: 03/12/2020 Target Resolution Date: 04/09/2020 Goal Status: Active Interventions: Assess patient pain level pre-, during and post procedure and prior to discharge Provide education on necrotic tissue and debridement process Treatment Activities: Apply topical anesthetic as ordered : 03/12/2020 Notes: Tissue Oxygenation Nursing Diagnoses: Actual ineffective tissue perfusion; peripheral (select once diagnosis is confirmed) Knowledge deficit related to disease process and management Potential alteration in peripheral tissue perfusion (select prior to confirmation of diagnosis) Goals: Patient/caregiver will verbalize understanding of disease process and disease management Date Initiated: 03/12/2020 Target Resolution Date: 04/09/2020 Goal Status: Active Interventions: Assess patient understanding of disease process and management upon diagnosis and as needed Assess peripheral arterial status upon admission and as needed Provide education on tissue oxygenation and ischemia Notes: Wound/Skin Impairment Nursing Diagnoses: Impaired tissue integrity Knowledge deficit related to ulceration/compromised skin integrity Goals: Patient/caregiver will verbalize understanding of skin care regimen Date Initiated: 03/12/2020 Target Resolution Date: 04/09/2020 Goal Status: Active Ulcer/skin breakdown will have a volume reduction of 30% by week 4 Date Initiated: 03/12/2020 Target Resolution Date: 04/09/2020 Goal Status: Active Interventions: Assess patient/caregiver ability to obtain necessary supplies Assess patient/caregiver ability to perform ulcer/skin care regimen upon admission and as needed Assess ulceration(s) every visit Provide education on ulcer and skin care Treatment Activities: Skin care regimen initiated : 03/12/2020 Topical wound management initiated : 03/12/2020 Notes: Electronic Signature(s) Signed: 03/19/2020 5:09:30 PM By: Levan Hurst  RN, BSN Entered By: Levan Hurst on 03/19/2020 14:37:52 -------------------------------------------------------------------------------- Pain Assessment Details Patient Name: Date of Service: MATTIS, FEATHERLY 03/19/2020 2:00 PM Medical Record Number:[id number] Patient Account Number: [id number] Date of Birth/Sex: Treating RN: [birth year] (54 y.o. Marvis Repress Primary Care Hanako Tipping: Nathanial Rancher Other Clinician: Referring Ryley Bachtel: Treating Iyla Balzarini/Extender:Robson, Deirdre Pippins, OBED Weeks in Treatment: 1 Active Problems Location of Pain Severity and Description of Pain Patient Has Paino Yes Site Locations Pain Location: Generalized Pain, Pain in Ulcers With Dressing Change: Yes Duration of the Pain. Constant / Intermittento Constant Rate the pain. Current Pain Level: 10 Worst Pain Level: 10 Least Pain Level: 7 Tolerable Pain Level: 5 Character of Pain Describe the Pain: Aching, Burning, Shooting, Splitting, Stabbing Pain Management and Medication Current Pain Management: How does your wound impact your activities of daily livingo Sleep: Yes Appetite: Yes Electronic Signature(s) Signed: 03/19/2020 4:55:48 PM By: Kela Millin Entered By: Kela Millin on 03/19/2020 13:40:34 -------------------------------------------------------------------------------- Patient/Caregiver Education Details Patient Name: Date of Service: [name] 4/5/2021andnbsp2:00 PM Medical Record [id number] Patient Account Number: [id number] Date of Birth/Gender: 08-17-1966 (53 y.o. M) Treating RN: Levan Hurst Primary Care Physician: Nathanial Rancher Other Clinician: Referring Physician: Treating Physician/Extender:Robson, Orlie Pollen in Treatment: 1 Education Assessment Education Provided To: Patient Education Topics Provided Wound/Skin Impairment: Methods: Explain/Verbal Responses: State content correctly Electronic Signature(s) Signed: 03/19/2020 5:09:30  PM By: Levan Hurst RN, BSN Entered By: Levan Hurst on 03/19/2020 14:39:28 -------------------------------------------------------------------------------- Wound Assessment Details Patient Name: Date of Service: [name] [name], [name] [name] 03/19/2020 2:00 PM Medical Record Number:[id number] Patient Account Number: [id number] Date of Birth/Sex: Treating RN: 12-Jan-1966 (54 y.o. Marvis Repress Primary Care Pranavi Aure: Nathanial Rancher Other Clinician: Referring Tyneka Scafidi: Treating Kumiko Fishman/Extender:Robson, Deirdre Pippins, OBED Weeks in Treatment:  1 Wound Status Wound Number: 1 Primary Venous Leg Ulcer Etiology: Wound Location: Left Foot Wound Open Wounding Event: Gradually Appeared Status: Date Acquired: 02/10/2020 Comorbid Arrhythmia, Congestive Heart Failure, Weeks Of Treatment: 1 History: Hypertension, End Stage Renal Disease Clustered Wound: Yes Wound Measurements Length: (cm) 8 % Reduct Width: (cm) 12 % Reduct Depth: (cm) 0.1 Epitheli Area: (cm) 75.398 Tunneli Volume: (cm) 7.54 Undermi Wound Description Full Thickness Without Exposed Support Foul Odo Classification: Structures Slough/F Wound Distinct, outline attached Margin: Exudate Medium Amount: Exudate Serous Type: Exudate amber Color: Wound Bed Granulation Amount: Small (1-33%) Granulation Quality: Red, Pink Fascia E Necrotic Amount: Large (67-100%) Fat Laye Necrotic Quality: Eschar, Adherent Slough Tendon E Muscle E Joint Ex Bone Exp r After Cleansing: No ibrino Yes Exposed Structure xposed: No r (Subcutaneous Tissue) Exposed: Yes xposed: No xposed: No posed: No osed: No ion in Area: -6.7% ion in Volume: -6.7% alization: None ng: No ning: No Treatment Notes Wound #1 (Left Foot) 1. Cleanse With Wound Cleanser 3. Primary Dressing Applied Other primary dressing (specifiy in notes) 4. Secondary Dressing Roll Gauze 5. Secured With Tape Notes betadine moistened gauze Electronic  Signature(s) Signed: 03/19/2020 4:55:48 PM By: Kela Millin Entered By: Kela Millin on 03/19/2020 13:43:42 -------------------------------------------------------------------------------- Wound Assessment Details Patient Name: Date of Service: [name] [name], [name] [name] 03/19/2020 2:00 PM Medical Record Number:[id number] Patient Account Number: [id number] Date of Birth/Sex: Treating RN: 02/20/1966 (54 y.o. Marvis Repress Primary Care Cookie Pore: Nathanial Rancher Other Clinician: Referring Coraleigh Sheeran: Treating Temiloluwa Laredo/Extender:Robson, Deirdre Pippins, OBED Weeks in Treatment: 1 Wound Status Wound Number: 2 Primary Venous Leg Ulcer Etiology: Wound Location: Right Foot Wound Open Wounding Event: Gradually Appeared Status: Date Acquired: 02/10/2020 Comorbid Arrhythmia, Congestive Heart Failure, Weeks Of Treatment: 1 History: Hypertension, End Stage Renal Disease Clustered Wound: Yes Wound Measurements Length: (cm) 4 % Reduct Width: (cm) 11 % Reduct Depth: (cm) 0.1 Epitheli Area: (cm) 34.558 Tunneli Volume: (cm) 3.456 Undermi Wound Description Classification: Full Thickness Without Exposed Support Foul Odo Structures Slough/F Wound Distinct, outline attached Margin: Exudate Medium Amount: Exudate Serous Type: Exudate amber Color: Wound Bed Granulation Amount: Small (1-33%) Granulation Quality: Red, Pink Fascia E Necrotic Amount: Large (67-100%) Fat Laye Necrotic Quality: Eschar, Adherent Slough Tendon E Muscle E Joint Ex Bone Exp r After Cleansing: No ibrino Yes Exposed Structure xposed: No r (Subcutaneous Tissue) Exposed: Yes xposed: No xposed: No posed: No osed: No ion in Area: -95.6% ion in Volume: -95.6% alization: None ng: No ning: No Treatment Notes Wound #2 (Right Foot) 1. Cleanse With Wound Cleanser 3. Primary Dressing Applied Other primary dressing (specifiy in notes) 4. Secondary Dressing Roll Gauze 5. Secured With Tape Notes betadine  moistened gauze Electronic Signature(s) Signed: 03/19/2020 4:55:48 PM By: Kela Millin Entered By: Kela Millin on 03/19/2020 13:44:19 -------------------------------------------------------------------------------- Wound Assessment Details Patient Name: Date of Service: [name] [name], [name] [name] 03/19/2020 2:00 PM Medical Record Number:[id number] Patient Account Number: [id number] Date of Birth/Sex: Treating RN: May 26, 1966 (54 y.o. Marvis Repress Primary Care Wilferd Ritson: Nathanial Rancher Other Clinician: Referring Avalyn Molino: Treating Ermias Tomeo/Extender:Robson, Deirdre Pippins, OBED Weeks in Treatment: 1 Wound Status Wound Number: 3 Primary Trauma, Other Etiology: Wound Location: Right Knee Wound Open Wounding Event: Gradually Appeared Status: Date Acquired: 02/03/2020 Comorbid Arrhythmia, Congestive Heart Failure, Weeks Of Treatment: 1 History: Hypertension, End Stage Renal Disease Clustered Wound: No Wound Measurements Length: (cm) 2.5 % Reduct Width: (cm) 4 % Reduct Depth: (cm) 0.1 Epitheli Area: (cm) 7.854 Tunneli Volume: (cm) 0.785 Undermi Wound Description Classification: Full Thickness Without Exposed Support Foul Odo Structures Slough/F  Wound Distinct, outline attached Margin: Exudate None Present Amount: Wound Bed Granulation Amount: None Present (0%) Necrotic Amount: Large (67-100%) Fascia Ex Necrotic Quality: Eschar Fat Layer Tendon Ex Muscle Ex Joint Exp Bone Expo r After Cleansing: No ibrino No Exposed Structure posed: No (Subcutaneous Tissue) Exposed: No posed: No posed: No osed: No sed: No ion in Area: -25% ion in Volume: -25% alization: None ng: No ning: No Treatment Notes Wound #3 (Right Knee) 1. Cleanse With Wound Cleanser 3. Primary Dressing Applied Other primary dressing (specifiy in notes) 4. Secondary Dressing Roll Gauze 5. Secured With Tape Notes betadine moistened gauze Electronic Signature(s) Signed: 03/19/2020 4:55:48 PM By:  Kela Millin Entered By: Kela Millin on 03/19/2020 13:45:01 -------------------------------------------------------------------------------- Wound Assessment Details Patient Name: Date of Service: JAVARIS, [name] [name] 03/19/2020 2:00 PM Medical Record Number:[id number] Patient Account Number: [id number] Date of Birth/Sex: Treating RN: 1966-11-01 (54 y.o. Marvis Repress Primary Care Amedio Bowlby: Nathanial Rancher Other Clinician: Referring Johntavius Shepard: Treating Erick Murin/Extender:Robson, Deirdre Pippins, OBED Weeks in Treatment: 1 Wound Status Wound Number: 4 Primary Trauma, Other Etiology: Wound Location: Left Knee Wound Open Wounding Event: Trauma Status: Date Acquired: 02/03/2020 Comorbid Arrhythmia, Congestive Heart Failure, Weeks Of Treatment: 1 History: Hypertension, End Stage Renal Disease Clustered Wound: No Wound Measurements Length: (cm) 2 % Reductio Width: (cm) 3 % Reductio Depth: (cm) 0.1 Epithelial Area: (cm) 4.712 Tunneling Volume: (cm) 0.471 Undermi Wound Description Classification: Full Thickness Without Exposed Support Foul Odo Structures Slough/F Wound Distinct, outline attached Margin: Exudate None Present Amount: Wound Bed Granulation Amount: None Present (0%) Necrotic Amount: Large (67-100%) Fascia E Necrotic Quality: Eschar Fat Laye Tendon E Muscle E Joint Ex Bone Exp r After Cleansing: No ibrino No Exposed Structure xposed: No r (Subcutaneous Tissue) Exposed: No xposed: No xposed: No posed: No osed: No n in Area: -75.4% n in Volume: -75.1% ization: None : No ning: No Treatment Notes Wound #4 (Left Knee) 1. Cleanse With Wound Cleanser 3. Primary Dressing Applied Other primary dressing (specifiy in notes) 4. Secondary Dressing Roll Gauze 5. Secured With Tape Notes betadine moistened gauze Electronic Signature(s) Signed: 03/19/2020 4:55:48 PM By: Kela Millin Entered By: Kela Millin on 03/19/2020  13:45:39 -------------------------------------------------------------------------------- Vitals Details Patient Name: Date of Service: [name] [name], [name] [name] 03/19/2020 2:00 PM Medical Record Number:[id number] Patient Account Number: [id number] Date of Birth/Sex: Treating RN: [id number] (54 y.o. Marvis Repress Primary Care Payson Crumby: Nathanial Rancher Other Clinician: Referring Evann Koelzer: Treating Dearies Meikle/Extender:Robson, Deirdre Pippins, OBED Weeks in Treatment: 1 Vital Signs Time Taken: 13:25 Temperature (F): 97.6 Height (in): 69 Pulse (bpm): 67 Weight (lbs): 141 Respiratory Rate (breaths/min): 19 Body Mass Index (BMI): 20.8 Blood Pressure (mmHg): 114/77 Reference Range: 80 - 120 mg / dl Electronic Signature(s) Signed: 03/19/2020 4:55:48 PM By: Kela Millin Entered By: Kela Millin on 03/19/2020 13:31:50

## 2020-03-20 ENCOUNTER — Encounter (HOSPITAL_COMMUNITY): Payer: Self-pay

## 2020-03-20 ENCOUNTER — Other Ambulatory Visit: Payer: Self-pay | Admitting: Physical Medicine and Rehabilitation

## 2020-03-20 ENCOUNTER — Other Ambulatory Visit (HOSPITAL_COMMUNITY): Payer: Self-pay | Admitting: Interventional Radiology

## 2020-03-20 ENCOUNTER — Other Ambulatory Visit: Payer: Self-pay

## 2020-03-20 ENCOUNTER — Telehealth: Payer: Self-pay | Admitting: *Deleted

## 2020-03-20 ENCOUNTER — Ambulatory Visit (HOSPITAL_COMMUNITY)
Admission: RE | Admit: 2020-03-20 | Discharge: 2020-03-20 | Disposition: A | Discharge status: Home / Self Care | Payer: Medicare Other | Source: Ambulatory Visit | Attending: Interventional Radiology | Admitting: Interventional Radiology

## 2020-03-20 DIAGNOSIS — N2581 Secondary hyperparathyroidism of renal origin: Secondary | ICD-10-CM | POA: Diagnosis not present

## 2020-03-20 DIAGNOSIS — Z931 Gastrostomy status: Secondary | ICD-10-CM

## 2020-03-20 DIAGNOSIS — Z992 Dependence on renal dialysis: Secondary | ICD-10-CM | POA: Diagnosis not present

## 2020-03-20 DIAGNOSIS — N186 End stage renal disease: Secondary | ICD-10-CM | POA: Diagnosis not present

## 2020-03-20 HISTORY — PX: IR PATIENT EVAL TECH 0-60 MINS: IMG5564

## 2020-03-20 NOTE — Telephone Encounter (Signed)
[name] [name], HHST left a message asking for verbal orders for HHST 1wk1, 2wk3.  Medical record reviewed. Social work note reviewed.  Verbal orders given per office protocol.

## 2020-03-20 NOTE — Procedures (Signed)
Pt arrived in IR today for evaluation and possible exchange of gastrostomy tube.  Patient and family member did not want tube changed. Patient has been eating by mouth for at least 2-3 weeks and wanted tube removed.  I explained to the patient that we could not removed the tube without an order. I called the internal medicine clinic to ask for an order and was told the patient would need to return to the clinic to see a provider as they have not been there since discharged from the hospital. I offered to change dressing for the patient.  I removed dressing, cinched down the bumper as it was approximately 2 inched above the skin insertion site. New dressing was applied. Patient and family understood the need to see PCP prior to removal of tube.  The phone number was given to them to call for that appointment.

## 2020-03-21 ENCOUNTER — Encounter (HOSPITAL_BASED_OUTPATIENT_CLINIC_OR_DEPARTMENT_OTHER): Payer: Medicare Other | Admitting: Physician Assistant

## 2020-03-21 ENCOUNTER — Ambulatory Visit (INDEPENDENT_AMBULATORY_CARE_PROVIDER_SITE_OTHER): Payer: Medicare Other | Admitting: Internal Medicine

## 2020-03-21 VITALS — BP 123/101 | HR 80 | Temp 98.0°F | Ht 69.0 in | Wt 150.0 lb

## 2020-03-21 DIAGNOSIS — J9611 Chronic respiratory failure with hypoxia: Secondary | ICD-10-CM | POA: Diagnosis not present

## 2020-03-21 DIAGNOSIS — N186 End stage renal disease: Secondary | ICD-10-CM | POA: Diagnosis not present

## 2020-03-21 DIAGNOSIS — Z86711 Personal history of pulmonary embolism: Secondary | ICD-10-CM | POA: Diagnosis not present

## 2020-03-21 DIAGNOSIS — R1310 Dysphagia, unspecified: Secondary | ICD-10-CM

## 2020-03-21 DIAGNOSIS — Z8674 Personal history of sudden cardiac arrest: Secondary | ICD-10-CM

## 2020-03-21 DIAGNOSIS — Z931 Gastrostomy status: Secondary | ICD-10-CM | POA: Diagnosis not present

## 2020-03-21 DIAGNOSIS — Z87891 Personal history of nicotine dependence: Secondary | ICD-10-CM

## 2020-03-21 DIAGNOSIS — I472 Ventricular tachycardia: Secondary | ICD-10-CM | POA: Diagnosis not present

## 2020-03-21 DIAGNOSIS — Z8701 Personal history of pneumonia (recurrent): Secondary | ICD-10-CM | POA: Diagnosis not present

## 2020-03-21 DIAGNOSIS — R1312 Dysphagia, oropharyngeal phase: Secondary | ICD-10-CM

## 2020-03-21 DIAGNOSIS — Z9981 Dependence on supplemental oxygen: Secondary | ICD-10-CM | POA: Diagnosis not present

## 2020-03-21 DIAGNOSIS — I5022 Chronic systolic (congestive) heart failure: Secondary | ICD-10-CM | POA: Diagnosis not present

## 2020-03-21 NOTE — Progress Notes (Signed)
CC: PEG tube removal  HPI:  [name] is a 54 y.o. M with a significant PMH of PEA arrest on 11/16/2019 during his HD session requiring emergent cricotomy in addition to CPR and ACLS. Pt had a subsequent prolonged hospitalization in the ICU complicated by VF arrest on 11/22/2019, exchange to tracheostomy for acute on chronic respiratory failure, subsegmental PE's, HFrEF (EF 30-35% with severe LV hypokinesis), and bilateral LE ischemic changes of the feet. Pt was discharged to [facility] on 1/8 for vent management and continued care. A G-tube was placed on 1/25. He tolerated extubation and was decannulated on 2/17. He was admitted to CIR on 2/19 due to functional decline. Over his prolonged course, pt had recurrent issues with volume overload s/p multiple thoracentesis and serial HD sessions, recurrent pneumonias treated with multiple antibiotic courses, anxiety, dysphagia, and cognitive deficits. On discharge, pt's lethargy resolved and he was at his cognitive baseline. Pt still requires supplemental oxygen (2L) due to hypoxia with activity. His weight was 141.5lbs at discharge on 3/15. Instructed to continue follow-up with vascular surgery, cardiology, PM&R, and IR for PEG removal. Per CIR, pt to receive HHPT, HHOT, HHST, CNA, and SN by [facility].  Pt presents today to get IR order for PEG removal.   Past Medical History:  Diagnosis Date  . Abdominal distension 08/10/2018  . Acute on chronic respiratory failure with hypoxia ([place])   . Acute on chronic systolic and diastolic heart failure, NYHA class 4 ([place])   . Acute pulmonary embolism without acute cor pulmonale ([facility])   . Alcohol abuse 11/26/2010   Qualifier: Diagnosis of  By: [name] MD, Pershing Proud  . Anemia due to blood loss, acute 07/15/2012  . CHF (congestive heart failure) ([place])   . Cholecystitis 04/18/2014  . Chronic atrial fibrillation ([place])   . Cigarette smoker    has currently quit  .  Cocaine abuse ([place]) 11/26/2010   Qualifier: Diagnosis of  By: [name] MD, Benjamine Mola  has been over a year  . 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 Echo 8/13: Severe LVH, EF 40-45%, inferoposterior akinesis, grade 2 diastolic dysfunction, moderate LAE, mild RVE, mildly reduced RVSF, mild RAE; cannot rule out R atrial mass-suggest TEE or cardiac MRI  //  B. Echo 3/17: Mild LVH, EF 25-30%, diffuse HK, grade 2 diastolic dysfunction, mild MR, severe LAE,  moderately reduced RVSF, severe RAE, mild TR, moderate PI, PASP 55 mmHg    . Dyspnea   . End stage renal disease on dialysis ([place])   . ESRD (end stage renal disease) on dialysis [facility])    MWF- [place] (05/18/2017)  . Hemodialysis patient ([place])    M,W,F  . Hernia, inguinal, right 08/05/2016  . Hypertension   . 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 Hypocalcemia 07/15/2012  . Medical non-compliance   . Non-ischemic cardiomyopathy ([place])    A. R/L HC 3/17: Normal coronary arteries, moderate pulmonary hypertension (PASP 65 mmHg), elevated LV filling pressures (LVEDP 45 mmHg)   . NSVT (nonsustained ventricular tachycardia) ([place]) 02/22/2016  . NSVT (nonsustained ventricular tachycardia) ([place]) 02/22/2016  . Pneumonia 11/12/2017  . Polysubstance abuse ([place])   . Prolonged Q-T interval on ECG 05/18/2017  . Prolonged QT interval 05/18/2017  . Restless leg syndrome   . Solitary pulmonary nodule 10/10/2015   See cxr 10/09/2015 -  CT rec 10/10/2015 >>>   . Upper airway cough syndrome 10/09/2015   Off ACEi around 1st Oct 2016  - Sinus CT 10/10/2015 >>>     Review of Systems:   Review of Systems  Constitutional: Negative for chills and fever.  Respiratory: Positive for shortness of breath (on excertion, at baseline since discharge). Negative for cough.   Cardiovascular: Negative for chest pain,  palpitations and leg swelling.  Gastrointestinal: Negative for abdominal pain, constipation, diarrhea, nausea and vomiting.       PEG tube leaking  Musculoskeletal:       Chronic bilateral feet pain  Skin:       Chronic bilateral feet wounds   Physical Exam:  Vitals:   03/21/20 1345  BP: (!) 123/101  Pulse: 80  Temp: 98 F (36.7 C)  TempSrc: Oral  SpO2: 100%  Weight: 150 lb (68 kg)  Height: 5\' 9"  (1.753 m)   Physical Exam Constitutional:      General: He is not in acute distress.    Appearance: He is ill-appearing.  Cardiovascular:     Rate and Rhythm: Normal rate and regular rhythm.     Heart sounds: Normal heart sounds.  Pulmonary:     Comments: Normal respiratory effort on 2L [name] [name]. Scattered course breath sounds throughout with mildly decreased breath sounds in the R base. Abdominal:     General: Abdomen is flat. Bowel sounds are normal. There is no distension.     Palpations: Abdomen is soft.     Tenderness: There is no abdominal tenderness.     Comments: PEG tube in place with surrounding bandages.  Musculoskeletal:     Comments: Bilateral feet wrapped in ACE bandages.  Skin:    General: Skin is warm and dry.  Neurological:     Mental Status: He is alert. Mental status is at baseline.  Psychiatric:        Behavior: Behavior is agitated.     Comments: Pt threatening to pull out PEG tube in the office    Assessment & Plan:   See Encounters Tab for problem based charting.  Patient discussed with Dr. Daryll Drown

## 2020-03-21 NOTE — Patient Instructions (Addendum)
Mr. Heyer,  It was nice meeting you today!  An order for your tube removal has been faxed to interventional radiology. Please call [phone] Caryl Pina) to confirm an appointment time for it's removal. It can hopefull be done on Friday, as you will need to not eat anything before your procedure.  Please follow-up with Dr. Eileen Stanford your primary care provider in June.  Thank you for letting us be a part of your care!

## 2020-03-22 DIAGNOSIS — N186 End stage renal disease: Secondary | ICD-10-CM | POA: Diagnosis not present

## 2020-03-22 DIAGNOSIS — N2581 Secondary hyperparathyroidism of renal origin: Secondary | ICD-10-CM | POA: Diagnosis not present

## 2020-03-22 DIAGNOSIS — Z992 Dependence on renal dialysis: Secondary | ICD-10-CM | POA: Diagnosis not present

## 2020-03-23 ENCOUNTER — Emergency Department (HOSPITAL_COMMUNITY)
Admission: EM | Admit: 2020-03-23 | Discharge: 2020-03-23 | Disposition: A | Discharge status: Home / Self Care | Payer: Medicare Other | Attending: Emergency Medicine | Admitting: Emergency Medicine

## 2020-03-23 ENCOUNTER — Encounter (HOSPITAL_COMMUNITY): Payer: Self-pay

## 2020-03-23 ENCOUNTER — Other Ambulatory Visit: Payer: Self-pay

## 2020-03-23 ENCOUNTER — Emergency Department (HOSPITAL_COMMUNITY): Payer: Medicare Other

## 2020-03-23 ENCOUNTER — Ambulatory Visit (HOSPITAL_COMMUNITY): Admission: RE | Admit: 2020-03-23 | Payer: Medicare Other | Source: Ambulatory Visit

## 2020-03-23 DIAGNOSIS — W010XXA Fall on same level from slipping, tripping and stumbling without subsequent striking against object, initial encounter: Secondary | ICD-10-CM | POA: Diagnosis not present

## 2020-03-23 DIAGNOSIS — Z992 Dependence on renal dialysis: Secondary | ICD-10-CM | POA: Diagnosis not present

## 2020-03-23 DIAGNOSIS — N186 End stage renal disease: Secondary | ICD-10-CM | POA: Insufficient documentation

## 2020-03-23 DIAGNOSIS — I5042 Chronic combined systolic (congestive) and diastolic (congestive) heart failure: Secondary | ICD-10-CM | POA: Diagnosis not present

## 2020-03-23 DIAGNOSIS — Z87891 Personal history of nicotine dependence: Secondary | ICD-10-CM | POA: Insufficient documentation

## 2020-03-23 DIAGNOSIS — Z7901 Long term (current) use of anticoagulants: Secondary | ICD-10-CM | POA: Diagnosis not present

## 2020-03-23 DIAGNOSIS — S52571A Other intraarticular fracture of lower end of right radius, initial encounter for closed fracture: Secondary | ICD-10-CM | POA: Diagnosis not present

## 2020-03-23 DIAGNOSIS — Y999 Unspecified external cause status: Secondary | ICD-10-CM | POA: Diagnosis not present

## 2020-03-23 DIAGNOSIS — S52611A Displaced fracture of right ulna styloid process, initial encounter for closed fracture: Secondary | ICD-10-CM | POA: Diagnosis not present

## 2020-03-23 DIAGNOSIS — S6991XA Unspecified injury of right wrist, hand and finger(s), initial encounter: Secondary | ICD-10-CM | POA: Diagnosis present

## 2020-03-23 DIAGNOSIS — R52 Pain, unspecified: Secondary | ICD-10-CM | POA: Diagnosis not present

## 2020-03-23 DIAGNOSIS — M25531 Pain in right wrist: Secondary | ICD-10-CM | POA: Diagnosis not present

## 2020-03-23 DIAGNOSIS — Z79899 Other long term (current) drug therapy: Secondary | ICD-10-CM | POA: Diagnosis not present

## 2020-03-23 DIAGNOSIS — Y929 Unspecified place or not applicable: Secondary | ICD-10-CM | POA: Diagnosis not present

## 2020-03-23 DIAGNOSIS — S62101A Fracture of unspecified carpal bone, right wrist, initial encounter for closed fracture: Secondary | ICD-10-CM | POA: Diagnosis not present

## 2020-03-23 DIAGNOSIS — I132 Hypertensive heart and chronic kidney disease with heart failure and with stage 5 chronic kidney disease, or end stage renal disease: Secondary | ICD-10-CM | POA: Insufficient documentation

## 2020-03-23 DIAGNOSIS — M25539 Pain in unspecified wrist: Secondary | ICD-10-CM | POA: Diagnosis not present

## 2020-03-23 DIAGNOSIS — S52501A Unspecified fracture of the lower end of right radius, initial encounter for closed fracture: Secondary | ICD-10-CM | POA: Diagnosis not present

## 2020-03-23 DIAGNOSIS — Y939 Activity, unspecified: Secondary | ICD-10-CM | POA: Insufficient documentation

## 2020-03-23 MED ORDER — HYDROCODONE-ACETAMINOPHEN 5-325 MG PO TABS
2.0000 | ORAL_TABLET | Freq: Once | ORAL | Status: AC
  Administered 2020-03-23: 18:00:00 2 via ORAL
  Filled 2020-03-23: qty 2

## 2020-03-23 MED ORDER — HYDROCODONE-ACETAMINOPHEN 5-325 MG PO TABS: 1.0000 | ORAL_TABLET | Freq: Four times a day (QID) | ORAL | 0 refills | Status: DC | PRN

## 2020-03-23 NOTE — ED Notes (Signed)
Called for patient multiple times and no answer.

## 2020-03-23 NOTE — Assessment & Plan Note (Signed)
Pt presents today to get IR order for PEG removal. Per chart review it was placed at [facility] on 1/25. On review of CIR documentation, pt's tube feeds were stopped on 2/23. He was upgraded to regular textures and thin liquids on 2/26 by SLP. Pt was told to call IR after discharge from CIR for PEG tube removal. Pt's father states that they arrived at IR on 4/6, but there was no order instructing for removal. On examination today, pt denies abdominal pain, nausea, vomiting, dysphagia, or poor PO intake. He is adamant about removing the PEG ASAP as it has been leaking on his clothes and bed sheets.   Called IR in the office today. Was instructed that they couldn't remove the tube without an order and they had offered an exchange since it was leaking but pt decline. Stated that they could likely get the pt in later this week with an order placed today. Discussed with pt and pt's father that the appropriate order had been placed and they would be called to schedule an appointment time. Pt's father expressed understanding. Reiterated to pt that under no circumstances should he remove the tube himself due to risk of infection, bleeding, or perforation.   - Gastrotomy tube removal order placed to IR - pt to follow-up with PCP in June - continue to keep follow-ups with cardiology, wound care, vascular surgery, and PM&R

## 2020-03-23 NOTE — ED Provider Notes (Signed)
[facility] EMERGENCY DEPARTMENT Provider Note   CSN: [id number] Arrival date & time: 03/23/20  1256     History Chief Complaint  Patient presents with  . Fall    [name] [name] is a 54 y.o. male.  HPI   54 year old male the past medical history of CHF, chronic atrial fibrillation on Eliqus, alcohol abuse/polysubstance abuse, HTN, ESRD on HD MWF, presenting to the emergency department following a nonsyncopal fall brought in by EMS from home.  Patient reports that he tried to stop himself with an outstretched right upper extremity immediately complaining afterwards of right wrist pain and deformity.  Patient denies head trauma or loss of consciousness.  Patient denies any neck or back pain.  Patient received 100 fentanyl in route to the hospital with EMS.  Patient denies any head trauma or loss of consciousness.  Patient remembers the entire fall.  Patient denies pain anywhere else to his body including his head or his neck.    Past Medical History:  Diagnosis Date  . Abdominal distension 08/10/2018  . Acute on chronic respiratory failure with hypoxia ([place])   . Acute on chronic systolic and diastolic heart failure, NYHA class 4 ([place])   . Acute pulmonary embolism without acute cor pulmonale (HCC)   . Alcohol abuse 11/26/2010   Qualifier: Diagnosis of  By: [name] MD, Pershing Proud  . Anemia due to blood loss, acute 07/15/2012  . CHF (congestive heart failure) ([place])   . Cholecystitis 04/18/2014  . Chronic atrial fibrillation ([place])   . Cigarette smoker    has currently quit  . Cocaine abuse ([place]) 11/26/2010   Qualifier: Diagnosis of  By: [name] MD, Benjamine Mola  has been over a year  . Combined congestive systolic and diastolic heart failure ([employer]) 04/18/2014   A. Echo 8/13: Severe LVH, EF 40-45%, inferoposterior akinesis, grade 2 diastolic dysfunction, moderate LAE, mild RVE, mildly reduced RVSF, mild RAE; cannot rule out R atrial mass-suggest TEE or cardiac MRI   //  B. Echo 3/17: Mild LVH, EF 25-30%, diffuse HK, grade 2 diastolic dysfunction, mild MR, severe LAE,  moderately reduced RVSF, severe RAE, mild TR, moderate PI, PASP 55 mmHg    . Dyspnea   . End stage renal disease on dialysis ([place])   . ESRD (end stage renal disease) on dialysis [facility])    MWF- [place] (05/18/2017)  . Hemodialysis patient ([place])    M,W,F  . Hernia, inguinal, right 08/05/2016  . Hypertension   . 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 Hypocalcemia 07/15/2012  . Medical non-compliance   . Non-ischemic cardiomyopathy ([place])    A. R/L HC 3/17: Normal coronary arteries, moderate pulmonary hypertension (PASP 65 mmHg), elevated LV filling pressures (LVEDP 45 mmHg)   . NSVT (nonsustained ventricular tachycardia) ([place]) 02/22/2016  . NSVT (nonsustained ventricular tachycardia) ([place]) 02/22/2016  . Pneumonia 11/12/2017  . Polysubstance abuse ([place])   . Prolonged Q-T interval on ECG 05/18/2017  . Prolonged QT interval 05/18/2017  . Restless leg syndrome   . Solitary pulmonary nodule 10/10/2015   See cxr 10/09/2015 - CT rec 10/10/2015 >>>   . Upper airway cough syndrome 10/09/2015   Off ACEi around 1st Oct 2016  - Sinus CT 10/10/2015 >>>      Patient Active Problem List   Diagnosis Date Noted  . Pleural effusion 02/28/2020  . Supplemental oxygen dependent 02/28/2020  .  Sleep disturbance   . Generalized anxiety disorder   . Dysphagia   . Encephalopathy   . Debility   . ESRD on dialysis ([place])   . Dry gangrene (HCC)--multiple toes   . Hypotension   . S/P percutaneous endoscopic gastrostomy (PEG) tube placement ([place])   . Pericardial effusion   . Atrial fibrillation ([place])   . Acute on chronic respiratory failure with hypoxia ([place])   . End stage renal disease on dialysis ([place])   . Acute on chronic systolic and diastolic heart failure, NYHA class 4 ([place])   .  Chronic atrial fibrillation ([place])   . Acute pulmonary embolism without acute cor pulmonale (HCC)   . Tracheostomy status ([place])   . Adult failure to thrive   . Ventilator dependent ([place])   . Chronic respiratory failure with hypoxia ([place])   . Anoxic encephalopathy ([place])   . Tracheostomy in place [facility] ([place]   . Endotracheally intubated   . Palliative care by specialist   . Pressure injury of skin 11/19/2019  . Acute respiratory failure ([place])   . ESRD on hemodialysis ([place])   . Cardiac arrest ([place]) 11/16/2019  . Paroxysmal atrial fibrillation (HCC)   . Superficial venous thrombosis of right arm 11/05/2018  . Nonischemic cardiomyopathy ([place]) 11/03/2018  . Essential hypertension 11/03/2018  . ESRD needing dialysis ([place]) 10/26/2018  . Prolonged Q-T interval on ECG 05/18/2017  . Hernia, inguinal, right 08/05/2016  . Acute on chronic systolic CHF (congestive heart failure) ([place])   . Solitary pulmonary nodule 10/10/2015  . Chronic combined systolic and diastolic heart failure ([place]) 04/18/2014  . Anemia due to blood loss, acute 07/15/2012  . Hypocalcemia 07/15/2012  . Cigarette smoker 11/26/2010    Past Surgical History:  Procedure Laterality Date  . [place] TRANSPOSITION  01/19/2013   Procedure: BASCILIC VEIN TRANSPOSITION;  Surgeon: Conrad Castalia, MD;  Location: [facility];  Service: Vascular;  Laterality: Left;  left 2nd stage basilic vein transposition  . CARDIAC CATHETERIZATION N/A 02/25/2016   Procedure: Right/Left Heart Cath and Coronary Angiography;  Surgeon: Burnell Blanks, MD;  Location: [facility] CV LAB;  Service: Cardiovascular;  Laterality: N/A;  . INGUINAL HERNIA REPAIR Right 08/05/2016   Procedure: RIGHT INGUINAL HERNIA REPAIR WITH MESH;  Surgeon: Donnie Mesa, MD;  Location: [facility];  Service: General;  Laterality: Right;  . INSERTION OF DIALYSIS CATHETER  07/17/2012   Procedure: INSERTION OF DIALYSIS CATHETER;  Surgeon: Conrad Fleming, MD;  Location: [facility];  Service: Vascular;   Laterality: Right;  right internal jugular  . INSERTION OF MESH Right 08/05/2016   Procedure: INSERTION OF MESH;  Surgeon: Donnie Mesa, MD;  Location: [facility];  Service: General;  Laterality: Right;  . IR GASTROSTOMY TUBE MOD SED  01/09/2020  . IR PATIENT EVAL TECH 0-60 MINS  03/20/2020  . IR THORACENTESIS ASP PLEURAL SPACE W/IMG GUIDE  01/12/2020  . IR THORACENTESIS ASP PLEURAL SPACE W/IMG GUIDE  02/21/2020  . REVISION OF ARTERIOVENOUS GORETEX GRAFT Left 2/99/3716   Procedure: PLICATION OF ARTERIOVENOUS FISTULA LEFT ARM;  Surgeon: Elam Dutch, MD;  Location: [facility];  Service: Vascular;  Laterality: Left;  . REVISON OF ARTERIOVENOUS FISTULA Left 01/06/2017   Procedure: REVISON OF ARTERIOVENOUS FISTULA;  Surgeon: Conrad , MD;  Location: [facility];  Service: Vascular;  Laterality: Left;  . REVISON OF ARTERIOVENOUS FISTULA Left 10/27/2018   Procedure: REVISION PLICATION OF ARTERIOVENOUS FISTULA  LEFT UPPER ARM;  Surgeon: Waynetta Sandy, MD;  [facility];  Service: Vascular;  Laterality: Left;  . TRACHEOSTOMY TUBE PLACEMENT N/A 11/24/2019   Procedure: TRACHEOSTOMY;  Surgeon: [place]ora Gala, MD;  Location: [facility];  Service: ENT;  Laterality: N/A;  . UMBILICAL HERNIA REPAIR N/A 08/05/2016   Procedure: [place];  Surgeon: Donnie Mesa, MD;  Location: [facility];  Service: General;  Laterality: N/A;  . VENOGRAM N/A 08/09/2012   Procedure: VENOGRAM;  Surgeon: Conrad Duque, MD;  Location: [facility] CATH LAB;  Service: Cardiovascular;  Laterality: N/A;       Family History  Problem Relation Age of Onset  . Hypertension Mother   . Diabetes Mother   . Renal Disease Mother   . Hypertension Father     Social History   Tobacco Use  . Smoking status: Former Smoker    Years: 30.00    Types: Cigarettes    Quit date: 10/20/2018    Years since quitting: 1.4  . Smokeless tobacco: Never Used  Substance Use Topics  . Alcohol use: Yes    Alcohol/week: 4.0 standard drinks     Types: 4 Cans of beer per week  . Drug use: No    Types: Marijuana    Comment: 05/18/2017 "qd"    Home Medications Prior to Admission medications   Medication Sig Start Date End Date Taking? Authorizing Provider  acetaminophen (TYLENOL) 325 MG tablet Take 1-2 tablets (325-650 mg total) by mouth every 4 (four) hours as needed for mild pain. 02/27/20  Yes Love, Ivan Anchors, PA-C  amiodarone (PACERONE) 200 MG tablet Take 1 tablet (200 mg total) by mouth daily. 02/27/20  Yes Love, Ivan Anchors, PA-C  apixaban (ELIQUIS) 2.5 MG TABS tablet Take 1 tablet (2.5 mg total) by mouth 2 (two) times daily. 02/27/20  Yes Love, Ivan Anchors, PA-C  benztropine (COGENTIN) 1 MG tablet Take 1 tablet (1 mg total) by mouth 2 (two) times daily. 02/27/20  Yes Love, Ivan Anchors, PA-C  colchicine 0.6 MG tablet Take 1 tablet (0.6 mg total) by mouth daily. 02/28/20  Yes Love, Ivan Anchors, PA-C  dextromethorphan-guaiFENesin (MUCINEX DM) 30-600 MG 12hr tablet Take 1 tablet by mouth 2 (two) times daily. 02/27/20  Yes Love, Ivan Anchors, PA-C  docusate sodium (COLACE) 100 MG capsule Take 1 capsule (100 mg total) by mouth daily. 02/27/20 02/26/21 Yes Love, Ivan Anchors, PA-C  folic acid (FOLVITE) 1 MG tablet Take 1 tablet (1 mg total) by mouth daily. 02/28/20  Yes Love, Ivan Anchors, PA-C  levothyroxine (SYNTHROID) 25 MCG tablet Take 1 tablet (25 mcg total) by mouth daily at 6 (six) AM. 02/28/20  Yes Love, Ivan Anchors, PA-C  Melatonin 3 MG TABS Take 1 tablet (3 mg total) by mouth at bedtime. To help with sleep 02/27/20  Yes Love, Ivan Anchors, PA-C  midodrine (PROAMATINE) 10 MG tablet Take 1 tablet (10 mg total) by mouth 3 (three) times daily with meals. 02/27/20  Yes Love, Ivan Anchors, PA-C  multivitamin (RENA-VIT) TABS tablet Take 1 tablet by mouth at bedtime. 02/27/20  Yes Love, Ivan Anchors, PA-C  predniSONE (DELTASONE) 10 MG tablet Take 1 tablet (10 mg total) by mouth daily with breakfast. 02/28/20  Yes Love, Ivan Anchors, PA-C  pregabalin (LYRICA) 50 MG capsule Take 1 capsule (50 mg  total) by mouth at bedtime. 03/07/20  Yes Raulkar, Clide Deutscher, MD  senna-docusate (SENOKOT-S) 8.6-50 MG tablet Take 1 tablet by mouth at bedtime. 02/27/20  Yes Love, Ivan Anchors, PA-C  thiamine 100 MG tablet Take 1 tablet (100  mg total) by mouth daily. 02/28/20  Yes Love, Ivan Anchors, PA-C  traMADol (ULTRAM) 50 MG tablet Take 1 tablet (50 mg total) by mouth every 6 (six) hours as needed for moderate pain or severe pain. 03/07/20  Yes Raulkar, Clide Deutscher, MD  vitamin C (ASCORBIC ACID) 250 MG tablet Take 1 tablet (250 mg total) by mouth 2 (two) times daily. 02/27/20  Yes Love, Ivan Anchors, PA-C  Water For Irrigation, Sterile (FREE WATER) SOLN Place 100 mLs into feeding tube 2 (two) times daily. Use filtered or bottled water (No distilled water) 02/27/20  Yes Love, Ivan Anchors, PA-C  HYDROcodone-acetaminophen (NORCO/VICODIN) 5-325 MG tablet Take 1 tablet by mouth every 6 (six) hours as needed for severe pain. 03/23/20   Filbert Berthold, MD  prochlorperazine (COMPAZINE) 5 MG tablet Take 1 tablet (5 mg total) by mouth every 6 (six) hours as needed for nausea or vomiting. Patient not taking: Reported on 03/23/2020 02/27/20   Bary Leriche, PA-C    Allergies    Losartan  Review of Systems   Review of Systems  Constitutional: Negative for activity change, appetite change, chills, diaphoresis, fatigue and fever.  HENT: Negative for congestion and rhinorrhea.   Respiratory: Negative for cough, shortness of breath and wheezing.   Cardiovascular: Negative for chest pain.  Gastrointestinal: Negative for abdominal distention, abdominal pain, diarrhea, nausea and vomiting.  Genitourinary: Negative for decreased urine volume, difficulty urinating, dysuria, frequency and urgency.  Musculoskeletal: Positive for arthralgias and joint swelling. Negative for gait problem.  Skin: Negative for color change and wound.  Neurological: Negative for dizziness, syncope, weakness, light-headedness and headaches.  All other systems reviewed and  are negative.   Physical Exam Updated Vital Signs BP 104/89 (BP Location: Right Arm)   Pulse 74   Temp 98.2 F (36.8 C) (Oral)   Resp 16   SpO2 99%   Physical Exam Vitals and nursing note reviewed.  Constitutional:      General: He is not in acute distress.    Appearance: Normal appearance. He is normal weight. He is not ill-appearing.  HENT:     Head: Normocephalic.     Right Ear: External ear normal.     Left Ear: External ear normal.     Nose: Nose normal.     Mouth/Throat:     Mouth: Mucous membranes are moist.     Pharynx: Oropharynx is clear.  Eyes:     Extraocular Movements: Extraocular movements intact.  Cardiovascular:     Rate and Rhythm: Normal rate and regular rhythm.     Pulses: Normal pulses.     Heart sounds: Normal heart sounds.  Pulmonary:     Effort: Pulmonary effort is normal. No respiratory distress.     Breath sounds: Normal breath sounds. No wheezing or rhonchi.  Abdominal:     General: Bowel sounds are normal.     Palpations: Abdomen is soft.     Tenderness: There is no abdominal tenderness. There is no guarding.  Musculoskeletal:     Cervical back: Normal range of motion.     Right lower leg: No edema.     Left lower leg: No edema.     Comments: Right wrist deformity, swelling, tenderness.  2+ radial and ulnar pulses bilaterally.  Right forearm and hand compartments soft throughout  Skin:    General: Skin is warm and dry.     Capillary Refill: Capillary refill takes less than 2 seconds.  Neurological:     General: No  focal deficit present.     Mental Status: He is alert and oriented to person, place, and time. Mental status is at baseline.  Psychiatric:        Mood and Affect: Mood normal.     ED Results / Procedures / Treatments   Labs (all labs ordered are listed, but only abnormal results are displayed) Labs Reviewed - No data to display  EKG None  Radiology DG Wrist Complete Right  Result Date: 03/23/2020 CLINICAL DATA:   Fall. EXAM: RIGHT WRIST - COMPLETE 3+ VIEW COMPARISON:  None. FINDINGS: There is an acute and comminuted intra-articular fracture involving the distal radius. There is impaction of the fracture fragments as well as dorsal angulation of the distal fracture fragments. Ulnar styloid fracture is also noted. IMPRESSION: 1. Acute, comminuted intra-articular fracture involves the distal radius with impaction and dorsal angulation. 2. Ulnar styloid fracture. Electronically Signed   By: Kerby Moors M.D.   On: 03/23/2020 13:48    Procedures Procedures (including critical care time)  Medications Ordered in ED Medications  HYDROcodone-acetaminophen (NORCO/VICODIN) 5-325 MG per tablet 2 tablet (has no administration in time range)    ED Course  I have reviewed the triage vital signs and the nursing notes.  Pertinent labs & imaging results that were available during my care of the patient were reviewed by me and considered in my medical decision making (see chart for details).    MDM Rules/Calculators/A&P                      54 year old male the past medical history of CHF on 2L [place] baseline, chronic atrial fibrillation on eliquis, alcohol abuse/polysubstance abuse, HTN, ESRD on HD MWF, presenting to the emergency department following a nonsyncopal fall brought in by EMS from home.    Radiographs of the right wrist obtained in the triage process demonstrated an acute comminuted intra-articular fracture of the distal right radius with impaction and dorsal angulation of distal fracture fragments.  Concomitant ulnar styloid fracture.  Neurovascular intact in all major nerve distributions distally.  Discussed images with orthopedic surgery, Dr. Fredna Dow, who recommended placement of the patient in a sugar tong splint and discharged with a plan for follow-up early next week for operative planning and  Management.  Treat the patient's pain with Norco in the emergency department.  Provided patient with  prescription for Norco to manage his pain in the outpatient setting as well.  The plan for this patient was discussed with Dr. Regenia Skeeter, who voiced agreement and who oversaw evaluation and treatment of this patient.  Final Clinical Impression(s) / ED Diagnoses Final diagnoses:  Closed fracture of right wrist, initial encounter    Rx / DC Orders ED Discharge Orders         Ordered    Ambulatory referral to Hand Surgery     03/23/20 1652    HYDROcodone-acetaminophen (NORCO/VICODIN) 5-325 MG tablet  Every 6 hours PRN     03/23/20 1654           Filbert Berthold, MD 03/23/20 1656    Sherwood Gambler, MD 03/26/20 1708

## 2020-03-23 NOTE — ED Triage Notes (Signed)
Pt from home with ems for mechanical fall, dialysis pt. Tried to stop himself with him arms, deformity noted to right wrist, denies LOC or neck/back pain. Pt alert. 20G RAC, 171fent given en route. VSS

## 2020-03-23 NOTE — ED Notes (Signed)
Splint applied by ortho tech.  Family at bedside for dc.

## 2020-03-23 NOTE — Progress Notes (Signed)
Orthopedic Tech Progress Note Patient Details:  [name] [name] [birth year] [id number]  Ortho Devices Type of Ortho Device: Sugartong splint, Arm sling, Ace wrap Ortho Device/Splint Location: right Ortho Device/Splint Interventions: Application   Post Interventions Patient Tolerated: Well Instructions Provided: Care of device   [name] [name] 03/23/2020, 5:56 PM

## 2020-03-24 DIAGNOSIS — Z992 Dependence on renal dialysis: Secondary | ICD-10-CM | POA: Diagnosis not present

## 2020-03-24 DIAGNOSIS — N186 End stage renal disease: Secondary | ICD-10-CM | POA: Diagnosis not present

## 2020-03-24 DIAGNOSIS — N2581 Secondary hyperparathyroidism of renal origin: Secondary | ICD-10-CM | POA: Diagnosis not present

## 2020-03-26 ENCOUNTER — Encounter: Payer: Self-pay | Admitting: Surgery

## 2020-03-26 ENCOUNTER — Other Ambulatory Visit: Payer: Self-pay

## 2020-03-26 ENCOUNTER — Ambulatory Visit (INDEPENDENT_AMBULATORY_CARE_PROVIDER_SITE_OTHER): Payer: Medicare Other | Admitting: Surgery

## 2020-03-26 VITALS — BP 146/83 | HR 92 | Temp 98.2°F | Resp 20 | Ht 69.0 in | Wt 150.0 lb

## 2020-03-26 DIAGNOSIS — I96 Gangrene, not elsewhere classified: Secondary | ICD-10-CM | POA: Diagnosis not present

## 2020-03-26 NOTE — Telephone Encounter (Signed)
Extension 2wk3. Orders approved and given.

## 2020-03-26 NOTE — Progress Notes (Signed)
[facility]  Patient name: [name] [name] MRN: [id number] DOB: 1966-07-04 Sex: male   REASON FOR VISIT:    Follow up  [place] ILLNESS:    [name] [name] is a 54 y.o. male who I initially saw in the hospital on 12/02/2019 for ischemic changes to all of his toes.  At that time he was 2 weeks post PEA arrest which required emergent cricothyroidotomy which was later converted to a tracheostomy.  It was felt that his ischemic changes were due to management with pressors.  He had palpable pedal pulses.  [facility].  He has had some drainage from his wounds but they were slowly improving.  The patient has been refusing amputation.  He was last seen by Dr. Scot Dock 2 weeks ago.  He is now out of the hospital at home with his father.  He is on home oxygen.  He has been evaluated at the wound center.  He is having significant pain in his toes.   PAST MEDICAL HISTORY:   Past Medical History:  Diagnosis Date  . Abdominal distension 08/10/2018  . Acute on chronic respiratory failure with hypoxia ([place])   . Acute on chronic systolic and diastolic heart failure, NYHA class 4 ([place])   . Acute pulmonary embolism without acute cor pulmonale (HCC)   . Alcohol abuse 11/26/2010   Qualifier: Diagnosis of  By: [name] MD, Pershing Proud  . Anemia due to blood loss, acute 07/15/2012  . CHF (congestive heart failure) ([place])   . Cholecystitis 04/18/2014  . Chronic atrial fibrillation ([place])   . Cigarette smoker    has currently quit  . Cocaine abuse ([place]) 11/26/2010   Qualifier: Diagnosis of  By: [name] MD, Benjamine Mola  has been over a year  . 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 Echo 8/13: Severe LVH, EF 40-45%, inferoposterior akinesis, grade 2 diastolic dysfunction, moderate LAE, mild RVE, mildly reduced RVSF, mild RAE; cannot rule out  R atrial mass-suggest TEE or cardiac MRI  //  B. Echo 3/17: Mild LVH, EF 25-30%, diffuse HK, grade 2 diastolic dysfunction, mild MR, severe LAE,  moderately reduced RVSF, severe RAE, mild TR, moderate PI, PASP 55 mmHg    . Dyspnea   . End stage renal disease on dialysis ([place])   . ESRD (end stage renal disease) on dialysis [facility])    MWF- [place] (05/18/2017)  . Hemodialysis patient ([place])    M,W,F  . Hernia, inguinal, right 08/05/2016  . Hypertension   . 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 Hypocalcemia 07/15/2012  . Medical non-compliance   . Non-ischemic cardiomyopathy ([place])    A. R/L HC 3/17: Normal coronary arteries, moderate pulmonary hypertension (PASP 65 mmHg), elevated LV filling pressures (LVEDP 45 mmHg)   . NSVT (nonsustained ventricular tachycardia) ([place]) 02/22/2016  . NSVT (nonsustained ventricular tachycardia) ([place]) 02/22/2016  . Pneumonia 11/12/2017  . Polysubstance abuse ([place])   . Prolonged Q-T interval on ECG 05/18/2017  . Prolonged QT interval 05/18/2017  . Restless leg syndrome   . Solitary pulmonary nodule 10/10/2015   See cxr 10/09/2015 - CT rec 10/10/2015 >>>   . Upper airway cough syndrome 10/09/2015   Off ACEi around 1st Oct 2016  - Sinus CT 10/10/2015 >>>  FAMILY HISTORY:   Family History  Problem Relation Age of Onset  . Hypertension Mother   . Diabetes Mother   . Renal Disease Mother   . Hypertension Father     SOCIAL HISTORY:   Social History   Tobacco Use  . Smoking status: Former Smoker    Years: 30.00    Types: Cigarettes    Quit date: 10/20/2018    Years since quitting: 1.4  . Smokeless tobacco: Never Used  Substance Use Topics  . Alcohol use: Yes    Alcohol/week: 4.0 standard drinks    Types: 4 Cans of beer per week     ALLERGIES:   Allergies  Allergen Reactions  . Losartan Cough     CURRENT  MEDICATIONS:   Current Outpatient Medications  Medication Sig Dispense Refill  . acetaminophen (TYLENOL) 325 MG tablet Take 1-2 tablets (325-650 mg total) by mouth every 4 (four) hours as needed for mild pain.    Marland Kitchen amiodarone (PACERONE) 200 MG tablet Take 1 tablet (200 mg total) by mouth daily. 30 tablet 1  . apixaban (ELIQUIS) 2.5 MG TABS tablet Take 1 tablet (2.5 mg total) by mouth 2 (two) times daily. 60 tablet 1  . benztropine (COGENTIN) 1 MG tablet Take 1 tablet (1 mg total) by mouth 2 (two) times daily. 60 tablet 1  . colchicine 0.6 MG tablet Take 1 tablet (0.6 mg total) by mouth daily. 30 tablet 1  . dextromethorphan-guaiFENesin (MUCINEX DM) 30-600 MG 12hr tablet Take 1 tablet by mouth 2 (two) times daily. 14 tablet 0  . docusate sodium (COLACE) 100 MG capsule Take 1 capsule (100 mg total) by mouth daily. 30 capsule 2  . folic acid (FOLVITE) 1 MG tablet Take 1 tablet (1 mg total) by mouth daily. 30 tablet 1  . HYDROcodone-acetaminophen (NORCO/VICODIN) 5-325 MG tablet Take 1 tablet by mouth every 6 (six) hours as needed for severe pain. 14 tablet 0  . levothyroxine (SYNTHROID) 25 MCG tablet Take 1 tablet (25 mcg total) by mouth daily at 6 (six) AM. 30 tablet 1  . Melatonin 3 MG TABS Take 1 tablet (3 mg total) by mouth at bedtime. To help with sleep  0  . midodrine (PROAMATINE) 10 MG tablet Take 1 tablet (10 mg total) by mouth 3 (three) times daily with meals. 90 tablet 1  . multivitamin (RENA-VIT) TABS tablet Take 1 tablet by mouth at bedtime. 30 tablet 0  . predniSONE (DELTASONE) 10 MG tablet Take 1 tablet (10 mg total) by mouth daily with breakfast. 30 tablet 1  . pregabalin (LYRICA) 50 MG capsule Take 1 capsule (50 mg total) by mouth at bedtime. 30 capsule 1  . prochlorperazine (COMPAZINE) 5 MG tablet Take 1 tablet (5 mg total) by mouth every 6 (six) hours as needed for nausea or vomiting. 30 tablet 0  . senna-docusate (SENOKOT-S) 8.6-50 MG tablet Take 1 tablet by mouth at bedtime. 30  tablet 0  . thiamine 100 MG tablet Take 1 tablet (100 mg total) by mouth daily. 30 tablet 1  . traMADol (ULTRAM) 50 MG tablet Take 1 tablet (50 mg total) by mouth every 6 (six) hours as needed for moderate pain or severe pain. 120 tablet 0  . vitamin C (ASCORBIC ACID) 250 MG tablet Take 1 tablet (250 mg total) by mouth 2 (two) times daily.    . Water For Irrigation, Sterile (FREE WATER) SOLN Place 100 mLs into feeding tube 2 (two) times daily. Use filtered or bottled water (No  distilled water)     No current facility-administered medications for this visit.    REVIEW OF SYSTEMS:   [X]  denotes positive finding, [ ]  denotes negative finding Cardiac  Comments:  Chest pain or chest pressure:    Shortness of breath upon exertion:    Short of breath when lying flat:    Irregular heart rhythm:        Vascular    Pain in calf, thigh, or hip brought on by ambulation:    Pain in feet at night that wakes you up from your sleep:     Blood clot in your veins:    Leg swelling:         Pulmonary    Oxygen at home: x   Productive cough:     Wheezing:         Neurologic    Sudden weakness in arms or legs:     Sudden numbness in arms or legs:     Sudden onset of difficulty speaking or slurred speech:    Temporary loss of vision in one eye:     Problems with dizziness:         Gastrointestinal    Blood in stool:     Vomited blood:         Genitourinary    Burning when urinating:     Blood in urine:        Psychiatric    Major depression:         Hematologic    Bleeding problems:    Problems with blood clotting too easily:        Skin    Rashes or ulcers: x       Constitutional    Fever or chills:      PHYSICAL EXAM:   Vitals:   03/26/20 1553  BP: (!) 146/83  Pulse: 92  Resp: 20  Temp: 98.2 F (36.8 C)  SpO2: 95%  Weight: 150 lb (68 kg)  Height: 5\' 9"  (1.753 m)    GENERAL: The patient is a well-nourished male, in no acute distress. The vital signs are documented  above. CARDIAC: There is a regular rate and rhythm.  PULMONARY: Non-labored respirations MUSCULOSKELETAL: There are no major deformities or cyanosis. NEUROLOGIC: No focal weakness or paresthesias are detected. SKIN: See photo below PSYCHIATRIC: The patient has a normal affect.    STUDIES:   I have reviewed his vascular lab studies from 2 weeks ago with the following findings: +-------+-----------+-----------+------------+------------+  ABI/TBIToday's ABIToday's TBIPrevious ABIPrevious TBI  +-------+-----------+-----------+------------+------------+  Right 1.31                        +-------+-----------+-----------+------------+------------+  Left  1.22                        +-------+-----------+-----------+------------+------------+   MEDICAL ISSUES:   Ischemic test bilaterally: The patient has normal ABIs.  His ischemic changes are secondary to pressors from his PEA arrest at the end of 2020.  He is still refusing amputation.  He has no signs of infection.  We will continue with wound care.  He is already being seen at the wound center.  I will have him follow-up with me in 1 month.    Leia Alf, MD, FACS [facility] [phone] Pager [phone]

## 2020-03-27 DIAGNOSIS — Z992 Dependence on renal dialysis: Secondary | ICD-10-CM | POA: Diagnosis not present

## 2020-03-27 DIAGNOSIS — N2581 Secondary hyperparathyroidism of renal origin: Secondary | ICD-10-CM | POA: Diagnosis not present

## 2020-03-27 DIAGNOSIS — N186 End stage renal disease: Secondary | ICD-10-CM | POA: Diagnosis not present

## 2020-03-28 ENCOUNTER — Ambulatory Visit (HOSPITAL_COMMUNITY)
Admission: RE | Admit: 2020-03-28 | Discharge: 2020-03-28 | Disposition: A | Discharge status: Home / Self Care | Payer: Medicare Other | Source: Ambulatory Visit | Attending: Internal Medicine | Admitting: Internal Medicine

## 2020-03-28 ENCOUNTER — Other Ambulatory Visit: Payer: Self-pay

## 2020-03-28 DIAGNOSIS — Z431 Encounter for attention to gastrostomy: Secondary | ICD-10-CM | POA: Insufficient documentation

## 2020-03-28 DIAGNOSIS — S52571A Other intraarticular fracture of lower end of right radius, initial encounter for closed fracture: Secondary | ICD-10-CM | POA: Diagnosis not present

## 2020-03-28 DIAGNOSIS — R1312 Dysphagia, oropharyngeal phase: Secondary | ICD-10-CM

## 2020-03-28 DIAGNOSIS — Z931 Gastrostomy status: Secondary | ICD-10-CM | POA: Diagnosis not present

## 2020-03-28 HISTORY — PX: IR GASTROSTOMY TUBE REMOVAL: IMG5492

## 2020-03-29 DIAGNOSIS — N2581 Secondary hyperparathyroidism of renal origin: Secondary | ICD-10-CM | POA: Diagnosis not present

## 2020-03-29 DIAGNOSIS — N186 End stage renal disease: Secondary | ICD-10-CM | POA: Diagnosis not present

## 2020-03-29 DIAGNOSIS — Z992 Dependence on renal dialysis: Secondary | ICD-10-CM | POA: Diagnosis not present

## 2020-03-29 NOTE — Progress Notes (Signed)
Internal Medicine Clinic Attending  Case discussed with Dr. Jones at the time of the visit.  We reviewed the resident's history and exam and pertinent patient test results.  I agree with the assessment, diagnosis, and plan of care documented in the resident's note.  

## 2020-03-30 DIAGNOSIS — G9340 Encephalopathy, unspecified: Secondary | ICD-10-CM | POA: Diagnosis not present

## 2020-03-31 DIAGNOSIS — Z992 Dependence on renal dialysis: Secondary | ICD-10-CM | POA: Diagnosis not present

## 2020-03-31 DIAGNOSIS — N186 End stage renal disease: Secondary | ICD-10-CM | POA: Diagnosis not present

## 2020-03-31 DIAGNOSIS — N2581 Secondary hyperparathyroidism of renal origin: Secondary | ICD-10-CM | POA: Diagnosis not present

## 2020-04-02 ENCOUNTER — Encounter (HOSPITAL_BASED_OUTPATIENT_CLINIC_OR_DEPARTMENT_OTHER): Payer: Medicare Other | Admitting: Internal Medicine

## 2020-04-02 ENCOUNTER — Other Ambulatory Visit: Payer: Self-pay

## 2020-04-02 DIAGNOSIS — I70262 Atherosclerosis of native arteries of extremities with gangrene, left leg: Secondary | ICD-10-CM | POA: Diagnosis not present

## 2020-04-02 DIAGNOSIS — I4891 Unspecified atrial fibrillation: Secondary | ICD-10-CM | POA: Diagnosis not present

## 2020-04-02 DIAGNOSIS — Z992 Dependence on renal dialysis: Secondary | ICD-10-CM | POA: Diagnosis not present

## 2020-04-02 DIAGNOSIS — N186 End stage renal disease: Secondary | ICD-10-CM | POA: Diagnosis not present

## 2020-04-02 DIAGNOSIS — I70245 Atherosclerosis of native arteries of left leg with ulceration of other part of foot: Secondary | ICD-10-CM | POA: Diagnosis not present

## 2020-04-02 DIAGNOSIS — I70261 Atherosclerosis of native arteries of extremities with gangrene, right leg: Secondary | ICD-10-CM | POA: Diagnosis not present

## 2020-04-02 DIAGNOSIS — Z7901 Long term (current) use of anticoagulants: Secondary | ICD-10-CM | POA: Diagnosis not present

## 2020-04-02 DIAGNOSIS — I70263 Atherosclerosis of native arteries of extremities with gangrene, bilateral legs: Secondary | ICD-10-CM | POA: Diagnosis not present

## 2020-04-02 DIAGNOSIS — I70235 Atherosclerosis of native arteries of right leg with ulceration of other part of foot: Secondary | ICD-10-CM | POA: Diagnosis not present

## 2020-04-02 NOTE — Progress Notes (Signed)
DEVONNE, LALANI ([id number]) Visit Report for 04/02/2020 Arrival Information Details Patient Name: Date of Service: JSHON, IBE 04/02/2020 2:00 PM Medical Record [id number] Patient Account Number: [id number] Date of Birth/Sex: Treating RN: 03-04-1966 (54 y.o. Marvis Repress Primary Care Taquisha Phung: Nathanial Rancher Other Clinician: Referring Govanni Plemons: Treating Ijeoma Loor/Extender:Robson, Deirdre Pippins, OBED Weeks in Treatment: 3 Visit Information History Since Last Visit Added or deleted any medications: No Patient Arrived: Wheel Chair Any new allergies or adverse reactions: No Arrival Time: 14:44 Had a fall or experienced change in No Accompanied By: son activities of daily living that may affect Transfer Assistance: EasyPivot Patient risk of falls: Lift Signs or symptoms of abuse/neglect since last No Patient Identification Verified: Yes visito Secondary Verification Process Yes Hospitalized since last visit: No Completed: Implantable device outside of the clinic excluding No Patient Has Alerts: Yes cellular tissue based products placed in the center Patient Alerts: Patient on Blood since last visit: Thinner Has Dressing in Place as Prescribed: Yes Pain Present Now: Yes Electronic Signature(s) Signed: 04/02/2020 5:36:36 PM By: Kela Millin Entered By: Kela Millin on 04/02/2020 14:44:31 -------------------------------------------------------------------------------- Clinic Level of Care Assessment Details Patient Name: Date of Service: AKSH, SWART 04/02/2020 2:00 PM Medical Record Number:[id number] Patient Account Number: [id number] Date of Birth/Sex: Treating RN: 01-29-1966 (54 y.o. Janyth Contes Primary Care Jaishawn Witzke: Nathanial Rancher Other Clinician: Referring Jyquan Kenley: Treating Georgenia Salim/Extender:Robson, Deirdre Pippins, OBED Weeks in Treatment: 3 Clinic Level of Care Assessment Items TOOL 4 Quantity Score X - Use when only an EandM is performed  on FOLLOW-UP visit 1 0 ASSESSMENTS - Nursing Assessment / Reassessment X - Reassessment of Co-morbidities (includes updates in patient status) 1 10 X - Reassessment of Adherence to Treatment Plan 1 5 ASSESSMENTS - Wound and Skin Assessment / Reassessment []  - Simple Wound Assessment / Reassessment - one wound 0 X - Complex Wound Assessment / Reassessment - multiple wounds 4 5 []  - Dermatologic / Skin Assessment (not related to wound area) 0 ASSESSMENTS - Focused Assessment []  - Circumferential Edema Measurements - multi extremities 0 []  - Nutritional Assessment / Counseling / Intervention 0 X - Lower Extremity Assessment (monofilament, tuning fork, pulses) 1 5 []  - Peripheral Arterial Disease Assessment (using hand held doppler) 0 ASSESSMENTS - Ostomy and/or Continence Assessment and Care []  - Incontinence Assessment and Management 0 []  - Ostomy Care Assessment and Management (repouching, etc.) 0 PROCESS - Coordination of Care X - Simple Patient / Family Education for ongoing care 1 15 []  - Complex (extensive) Patient / Family Education for ongoing care 0 X - Staff obtains Programmer, systems, Records, Test Results / Process Orders 1 10 X - Staff telephones HHA, Nursing Homes / Clarify orders / etc 1 10 []  - Routine Transfer to another Facility (non-emergent condition) 0 []  - Routine Hospital Admission (non-emergent condition) 0 []  - New Admissions / Biomedical engineer / Ordering NPWT, Apligraf, etc. 0 []  - Emergency Hospital Admission (emergent condition) 0 X - Simple Discharge Coordination 1 10 []  - Complex (extensive) Discharge Coordination 0 PROCESS - Special Needs []  - Pediatric / Minor Patient Management 0 []  - Isolation Patient Management 0 []  - Hearing / Language / Visual special needs 0 []  - Assessment of Community assistance (transportation, D/C planning, etc.) 0 []  - Additional assistance / Altered mentation 0 []  - Support Surface(s) Assessment (bed, cushion, seat, etc.)  0 INTERVENTIONS - Wound Cleansing / Measurement []  - Simple Wound Cleansing - one wound 0 X - Complex Wound Cleansing - multiple wounds 4 5 X - Wound  Imaging (photographs - any number of wounds) 1 5 []  - Wound Tracing (instead of photographs) 0 []  - Simple Wound Measurement - one wound 0 X - Complex Wound Measurement - multiple wounds 4 5 INTERVENTIONS - Wound Dressings X - Small Wound Dressing one or multiple wounds 2 10 X - Medium Wound Dressing one or multiple wounds 2 15 []  - Large Wound Dressing one or multiple wounds 0 X - Application of Medications - topical 1 5 []  - Application of Medications - injection 0 INTERVENTIONS - Miscellaneous []  - External ear exam 0 []  - Specimen Collection (cultures, biopsies, blood, body fluids, etc.) 0 []  - Specimen(s) / Culture(s) sent or taken to Lab for analysis 0 []  - Patient Transfer (multiple staff / Harrel Lemon Lift / Similar devices) 0 []  - Simple Staple / Suture removal (25 or less) 0 []  - Complex Staple / Suture removal (26 or more) 0 []  - Hypo / Hyperglycemic Management (close monitor of Blood Glucose) 0 []  - Ankle / Brachial Index (ABI) - do not check if billed separately 0 X - Vital Signs 1 5 Has the patient been seen at the hospital within the last three years: Yes Total Score: 190 Level Of Care: New/Established - Level 5 Electronic Signature(s) Signed: 04/02/2020 6:02:31 PM By: Levan Hurst RN, BSN Entered By: Levan Hurst on 04/02/2020 15:25:57 -------------------------------------------------------------------------------- Encounter Discharge Information Details Patient Name: Date of Service: HILARIO, ROBARTS 04/02/2020 2:00 PM Medical Record Number:[id number] Patient Account Number: [id number] Date of Birth/Sex: Treating RN: 08-14-1966 (54 y.o. Janyth Contes Primary Care Sherley Leser: Nathanial Rancher Other Clinician: Referring Mario Coronado: Treating Prateek Knipple/Extender:Robson, Deirdre Pippins, OBED Weeks in Treatment: 3 Encounter  Discharge Information Items Discharge Condition: Stable Ambulatory Status: Wheelchair Discharge Destination: Home Transportation: Private Auto Accompanied By: brother Schedule Follow-up Appointment: Yes Clinical Summary of Care: Electronic Signature(s) Signed: 04/02/2020 5:42:42 PM By: Deon Pilling Entered By: Deon Pilling on 04/02/2020 15:46:00 -------------------------------------------------------------------------------- Lower Extremity Assessment Details Patient Name: Date of Service: HARVE, SPRADLEY 04/02/2020 2:00 PM Medical Record Number:[id number] Patient Account Number: [id number] Date of Birth/Sex: Treating RN: Mar 10, 1966 (54 y.o. Marvis Repress Primary Care Nafis Farnan: Nathanial Rancher Other Clinician: Referring Jahniah Pallas: Treating Frantz Quattrone/Extender:Robson, Deirdre Pippins, OBED Weeks in Treatment: 3 Edema Assessment Assessed: [Left: No] [Right: No] E[Left: dema] [Right: :] Calf Left: Right: Point of Measurement: 42 cm From Medial Instep 31.5 cm 31 cm Ankle Left: Right: Point of Measurement: 11 cm From Medial Instep 23 cm 21.5 cm Vascular Assessment Pulses: Dorsalis Pedis Palpable: [Left:No] [Right:No] Electronic Signature(s) Signed: 04/02/2020 5:36:36 PM By: Kela Millin Entered By: Kela Millin on 04/02/2020 14:46:48 -------------------------------------------------------------------------------- Multi Wound Chart Details Patient Name: Date of Service: DENNISON, MCDAID 04/02/2020 2:00 PM Medical Record Number:[id number] Patient Account Number: [id number] Date of Birth/Sex: Treating RN: 12/27/1965 (54 y.o. Janyth Contes Primary Care Paxton Kanaan: Nathanial Rancher Other Clinician: Referring Cletis Muma: Treating Joni Colegrove/Extender:Robson, Deirdre Pippins, OBED Weeks in Treatment: 3 Vital Signs Height(in): 69 Pulse(bpm): 79 Weight(lbs): 141 Blood Pressure(mmHg): 132/89 Body Mass Index(BMI): 21 Temperature(F): 98.2 Respiratory 21 Rate(breaths/min): Photos:  [1:No Photos] [2:No Photos] [3:No Photos] Wound Location: [1:Left Foot] [2:Right Foot] [3:Right Knee] Wounding Event: [1:Gradually Appeared] [2:Gradually Appeared] [3:Gradually Appeared] Primary Etiology: [1:Venous Leg Ulcer] [2:Venous Leg Ulcer] [3:Trauma, Other] Comorbid History: [1:Arrhythmia, Congestive Heart Failure, Hypertension, Heart Failure, Hypertension, Heart Failure, Hypertension, End Stage Renal Disease End Stage Renal Disease End Stage Renal Disease] [2:Arrhythmia, Congestive] [3:Arrhythmia,  Congestive] Date Acquired: [1:02/10/2020] [2:02/10/2020] [3:02/03/2020] Weeks of Treatment: [1:3] [2:3] [3:3] Wound Status: [1:Open] [2:Open] [3:Open] Clustered Wound: [1:Yes] [2:Yes] [3:No] Measurements  L x W x D 7.5x12.4x0.1 [2:4.1x13x0.1] [3:2.6x4x0.2] (cm) Area (cm) : [1:73.042] [2:41.862] [3:8.168] Volume (cm) : [1:7.304] [2:4.186] [3:1.634] % Reduction in Area: [1:-3.30%] [2:-136.90%] [3:-30.00%] % Reduction in Volume: -3.30% [2:-136.90%] [3:-160.20%] Classification: [1:Full Thickness Without Exposed Support Structures Exposed Support Structures Exposed Support Structures] [2:Full Thickness Without] [3:Full Thickness Without] Exudate Amount: [1:Medium] [2:Medium] [3:Small] Exudate Type: [1:Serous] [2:Serous] [3:Serous] Exudate Color: [1:amber] [2:amber] [3:amber] Wound Margin: [1:Distinct, outline attached Distinct, outline attached Distinct, outline attached] Granulation Amount: [1:Small (1-33%)] [2:Small (1-33%)] [3:None Present (0%)] Granulation Quality: [1:Pink] [2:Pink] [3:N/A] Necrotic Amount: [1:Large (67-100%)] [2:Large (67-100%)] [3:Large (67-100%)] Necrotic Tissue: [1:Eschar, Adherent Slough Eschar, Adherent Slough Eschar, Adherent Slough] Exposed Structures: [1:Fat Layer (Subcutaneous Fat Layer (Subcutaneous Fascia: No Tissue) Exposed: Yes Fascia: No Tendon: No Muscle: No Joint: No Bone: No] [2:Tissue) Exposed: Yes Fascia: No Tendon: No Muscle: No Joint: No Bone: No]  [3:Fat Layer (Subcutaneous Tissue)  Exposed: No Tendon: No Muscle: No Joint: No Bone: No] Epithelialization: [1:None 4] [2:None N/A] [3:None N/A] Photos: [1:No Photos] [2:N/A] [3:N/A] Wound Location: [1:Left Knee] [2:N/A] [3:N/A] Wounding Event: [1:Trauma] [2:N/A] [3:N/A] Primary Etiology: [1:Trauma, Other] [2:N/A] [3:N/A] Comorbid History: [1:Arrhythmia, Congestive Heart Failure, Hypertension, End Stage Renal Disease] [2:N/A] [3:N/A] Date Acquired: [1:02/03/2020] [2:N/A] [3:N/A] Weeks of Treatment: [1:3] [2:N/A] [3:N/A] Wound Status: [1:Open] [2:N/A] [3:N/A] Clustered Wound: [1:No] [2:N/A] [3:N/A] Measurements L x W x D 1.6x2x0.2 [2:N/A] [3:N/A] (cm) Area (cm) : [1:2.513] [2:N/A] [3:N/A] Volume (cm) : [1:0.503] [2:N/A] [3:N/A] % Reduction in Area: [1:6.40%] [2:N/A] [3:N/A] % Reduction in Volume: -87.00% [2:N/A] [3:N/A] Classification: [1:Full Thickness Without Exposed Support Structures] [2:N/A] [3:N/A] Exudate Amount: [1:Small] [2:N/A] [3:N/A] Exudate Type: [1:Serous] [2:N/A] [3:N/A] Exudate Color: [1:amber] [2:N/A] [3:N/A] Wound Margin: [1:Distinct, outline attached N/A] [3:N/A] Granulation Amount: [1:None Present (0%)] [2:N/A] [3:N/A] Granulation Quality: [1:N/A] [2:N/A] [3:N/A] Necrotic Amount: [1:Large (67-100%)] [2:N/A] [3:N/A] Necrotic Tissue: [1:Eschar, Adherent Slough N/A] [3:N/A] Exposed Structures: [1:Fascia: No Fat Layer (Subcutaneous Tissue) Exposed: No Tendon: No Muscle: No Joint: No Bone: No None] [2:N/A N/A] [3:N/A N/A] Treatment Notes Electronic Signature(s) Signed: 04/02/2020 5:34:04 PM By: Linton Ham MD Signed: 04/02/2020 6:02:31 PM By: Levan Hurst RN, BSN Entered By: Linton Ham on 04/02/2020 15:32:52 -------------------------------------------------------------------------------- [place] Details Patient Name: Date of Service: TARRON, KROLAK 04/02/2020 2:00 PM Medical Record Number:[id number] Patient Account Number:  [id number] Date of Birth/Sex: Treating RN: January 26, 1966 (54 y.o. Janyth Contes Primary Care Daaron Dimarco: Nathanial Rancher Other Clinician: Referring Sherita Decoste: Treating Travanti Mcmanus/Extender:Robson, Deirdre Pippins, OBED Weeks in Treatment: 3 Active Inactive Abuse / Safety / Falls / Self Care Management Nursing Diagnoses: Potential for falls Goals: Patient/caregiver will verbalize/demonstrate measures taken to prevent injury and/or falls Date Initiated: 03/12/2020 Target Resolution Date: 04/11/2020 Goal Status: Active Interventions: Assess fall risk on admission and as needed Assess impairment of mobility on admission and as needed per policy Notes: Necrotic Tissue Nursing Diagnoses: Impaired tissue integrity related to necrotic/devitalized tissue Knowledge deficit related to management of necrotic/devitalized tissue Goals: Necrotic/devitalized tissue will be minimized in the wound bed Date Initiated: 03/12/2020 Target Resolution Date: 04/09/2020 Goal Status: Active Patient/caregiver will verbalize understanding of reason and process for debridement of necrotic tissue Date Initiated: 03/12/2020 Target Resolution Date: 04/09/2020 Goal Status: Active Interventions: Assess patient pain level pre-, during and post procedure and prior to discharge Provide education on necrotic tissue and debridement process Treatment Activities: Apply topical anesthetic as ordered : 03/12/2020 Notes: Tissue Oxygenation Nursing Diagnoses: Actual ineffective tissue perfusion; peripheral (select once diagnosis is confirmed) Knowledge deficit related to disease process and management Potential alteration in  peripheral tissue perfusion (select prior to confirmation of diagnosis) Goals: Patient/caregiver will verbalize understanding of disease process and disease management Date Initiated: 03/12/2020 Target Resolution Date: 04/09/2020 Goal Status: Active Interventions: Assess patient understanding of disease process  and management upon diagnosis and as needed Assess peripheral arterial status upon admission and as needed Provide education on tissue oxygenation and ischemia Notes: Wound/Skin Impairment Nursing Diagnoses: Impaired tissue integrity Knowledge deficit related to ulceration/compromised skin integrity Goals: Patient/caregiver will verbalize understanding of skin care regimen Date Initiated: 03/12/2020 Target Resolution Date: 04/09/2020 Goal Status: Active Ulcer/skin breakdown will have a volume reduction of 30% by week 4 Date Initiated: 03/12/2020 Target Resolution Date: 04/09/2020 Goal Status: Active Interventions: Assess patient/caregiver ability to obtain necessary supplies Assess patient/caregiver ability to perform ulcer/skin care regimen upon admission and as needed Assess ulceration(s) every visit Provide education on ulcer and skin care Treatment Activities: Skin care regimen initiated : 03/12/2020 Topical wound management initiated : 03/12/2020 Notes: Electronic Signature(s) Signed: 04/02/2020 6:02:31 PM By: Levan Hurst RN, BSN Entered By: Levan Hurst on 04/02/2020 15:24:57 -------------------------------------------------------------------------------- Pain Assessment Details Patient Name: Date of Service: KENAN, MOODIE 04/02/2020 2:00 PM Medical Record Number:[id number] Patient Account Number: [id number] Date of Birth/Sex: Treating RN: 06-Dec-1966 (54 y.o. Marvis Repress Primary Care Kandra Graven: Nathanial Rancher Other Clinician: Referring Meriam Chojnowski: Treating Darrion Macaulay/Extender:Robson, Deirdre Pippins, OBED Weeks in Treatment: 3 Active Problems Location of Pain Severity and Description of Pain Patient Has Paino Yes Site Locations Pain Location: Generalized Pain, Pain in Ulcers With Dressing Change: Yes Duration of the Pain. Constant / Intermittento Constant Rate the pain. Current Pain Level: 8 Worst Pain Level: 10 Least Pain Level: 8 Tolerable Pain Level:  6 Character of Pain Describe the Pain: Aching, Burning, Shooting, Stabbing Pain Management and Medication Current Pain Management: Electronic Signature(s) Signed: 04/02/2020 5:36:36 PM By: Kela Millin Entered By: Kela Millin on 04/02/2020 14:45:29 -------------------------------------------------------------------------------- Patient/Caregiver Education Details Patient Name: Date of Service: [name] 4/19/2021andnbsp2:00 PM Medical Record [id number] Patient Account Number: [id number] Date of Birth/Gender: July 08, 1966 (54 y.o. M) Treating RN: Levan Hurst Primary Care Physician: Nathanial Rancher Other Clinician: Referring Physician: Treating Physician/Extender:Robson, Orlie Pollen in Treatment: 3 Education Assessment Education Provided To: Patient Education Topics Provided Tissue Oxygenation: Methods: Explain/Verbal Responses: State content correctly Wound/Skin Impairment: Methods: Explain/Verbal Responses: State content correctly Electronic Signature(s) Signed: 04/02/2020 6:02:31 PM By: Levan Hurst RN, BSN Entered By: Levan Hurst on 04/02/2020 15:25:17 -------------------------------------------------------------------------------- Wound Assessment Details Patient Name: Date of Service: JAYR, LUPERCIO 04/02/2020 2:00 PM Medical Record Number:[id number] Patient Account Number: [id number] Date of Birth/Sex: Treating RN: [birth year] (54 y.o. Marvis Repress Primary Care Nakyra Bourn: Nathanial Rancher Other Clinician: Referring Clarnce Homan: Treating Aura Bibby/Extender:Robson, Deirdre Pippins, OBED Weeks in Treatment: 3 Wound Status Wound Number: 1 Primary Venous Leg Ulcer Etiology: Wound Location: Left Foot Wound Open Wounding Event: Gradually Appeared Status: Date Acquired: 02/10/2020 Comorbid Arrhythmia, Congestive Heart Failure, Weeks Of Treatment: 3 History: Hypertension, End Stage Renal Disease Clustered Wound: Yes Wound  Measurements Length: (cm) 7.5 % Reduct Width: (cm) 12.4 % Reduct Depth: (cm) 0.1 Epitheli Area: (cm) 73.042 Tunneli Volume: (cm) 7.304 Undermi Wound Description Classification: Full Thickness Without Exposed Support Foul Odo Structures Slough/F Wound Distinct, outline attached Margin: Exudate Medium Amount: Exudate Serous Type: Exudate amber Color: Wound Bed Granulation Amount: Small (1-33%) Granulation Quality: Pink Fascia E Necrotic Amount: Large (67-100%) Fat Laye Necrotic Quality: Eschar, Adherent Slough Tendon E Muscle E Joint Ex Bone Exp r After Cleansing: No ibrino Yes Exposed Structure xposed: No r (Subcutaneous Tissue) Exposed: Yes  xposed: No xposed: No posed: No osed: No ion in Area: -3.3% ion in Volume: -3.3% alization: None ng: No ning: No Treatment Notes Wound #1 (Left Foot) 1. Cleanse With Wound Cleanser Soap and water 3. Primary Dressing Applied Other primary dressing (specifiy in notes) 4. Secondary Dressing Dry Gauze Roll Gauze 5. Secured With Medipore tape Notes betadine as primary. netting. Electronic Signature(s) Signed: 04/02/2020 5:36:36 PM By: Kela Millin Entered By: Kela Millin on 04/02/2020 14:51:47 -------------------------------------------------------------------------------- Wound Assessment Details Patient Name: Date of Service: SIMPSON, PAULOS 04/02/2020 2:00 PM Medical Record Number:[id number] Patient Account Number: [id number] Date of Birth/Sex: Treating RN: Apr 30, 1966 (54 y.o. Marvis Repress Primary Care Westin Knotts: Nathanial Rancher Other Clinician: Referring Montrail Mehrer: Treating Demarkis Gheen/Extender:Robson, Deirdre Pippins, OBED Weeks in Treatment: 3 Wound Status Wound Number: 2 Primary Venous Leg Ulcer Etiology: Wound Location: Right Foot Wound Open Wounding Event: Gradually Appeared Status: Date Acquired: 02/10/2020 Comorbid Arrhythmia, Congestive Heart Failure, Weeks Of Treatment: 3 History:  Hypertension, End Stage Renal Disease Clustered Wound: Yes Wound Measurements Length: (cm) 4.1 % Reduc Width: (cm) 13 % Reduc Depth: (cm) 0.1 Epithel Area: (cm) 41.862 Tunnel Volume: (cm) 4.186 Underm Wound Description Classification: Full Thickness Without Exposed Support Structures Wound Distinct, outline attached Margin: Exudate Medium Amount: Exudate Serous Type: Exudate amber Color: Wound Bed Granulation Amount: Small (1-33%) Granulation Quality: Pink Necrotic Amount: Large (67-100%) Necrotic Quality: Eschar, Adherent Slough Foul Odor After Cleansing: No Slough/Fibrino Yes Exposed Structure Fascia Exposed: No Fat Layer (Subcutaneous Tissue) Exposed: Yes Tendon Exposed: No Muscle Exposed: No Joint Exposed: No Bone Exposed: No tion in Area: -136.9% tion in Volume: -136.9% ialization: None ing: No ining: No Treatment Notes Wound #2 (Right Foot) 1. Cleanse With Wound Cleanser Soap and water 3. Primary Dressing Applied Other primary dressing (specifiy in notes) 4. Secondary Dressing Dry Gauze Roll Gauze 5. Secured With Medipore tape Notes betadine as primary. netting. Electronic Signature(s) Signed: 04/02/2020 5:36:36 PM By: Kela Millin Entered By: Kela Millin on 04/02/2020 14:52:09 -------------------------------------------------------------------------------- Wound Assessment Details Patient Name: Date of Service: Gannon, WALRATH 04/02/2020 2:00 PM Medical Record Number:[id number] Patient Account Number: [id number] Date of Birth/Sex: Treating RN: 1966/03/10 (54 y.o. Marvis Repress Primary Care Hatsuko Bizzarro: Nathanial Rancher Other Clinician: Referring Ura Hausen: Treating Jodell Weitman/Extender:Robson, Deirdre Pippins, OBED Weeks in Treatment: 3 Wound Status Wound Number: 3 Primary Trauma, Other Etiology: Wound Location: Right Knee Wound Open Wounding Event: Gradually Appeared Status: Date Acquired: 02/03/2020 Comorbid Arrhythmia, Congestive  Heart Failure, Weeks Of Treatment: 3 History: Hypertension, End Stage Renal Disease Clustered Wound: No Wound Measurements Length: (cm) 2.6 % Reduc Width: (cm) 4 % Reduc Depth: (cm) 0.2 Epithel Area: (cm) 8.168 Tunnel Volume: (cm) 1.634 Underm Wound Description Classification: Full Thickness Without Exposed Support Foul Odo Structures Slough/F Wound Distinct, outline attached Margin: Exudate Small Amount: Exudate Serous Type: Exudate amber Color: Wound Bed Granulation Amount: None Present (0%) Necrotic Amount: Large (67-100%) Fascia E Necrotic Quality: Eschar, Adherent Slough Fat Laye Tendon E Muscle E Joint Ex Bone Exp r After Cleansing: No ibrino Yes Exposed Structure xposed: No r (Subcutaneous Tissue) Exposed: No xposed: No xposed: No posed: No osed: No tion in Area: -30% tion in Volume: -160.2% ialization: None ing: No ining: No Treatment Notes Wound #3 (Right Knee) 1. Cleanse With Wound Cleanser 2. Periwound Care Skin Prep 3. Primary Dressing Applied Collegen AG Hydrogel or K-Y Jelly 4. Secondary Dressing Foam Border Dressing 5. Secured With Office manager) Signed: 04/02/2020 5:36:36 PM By: Kela Millin Entered By: Kela Millin on 04/02/2020 14:52:47 --------------------------------------------------------------------------------  Wound Assessment Details Patient Name: Date of Service: DALANTE, MINUS 04/02/2020 2:00 PM Medical Record [id number] Patient Account Number: [id number] Date of Birth/Sex: Treating RN: [birth year] (54 y.o. Marvis Repress Primary Care Taylor Spilde: Nathanial Rancher Other Clinician: Referring Kaulin Chaves: Treating Kayliah Tindol/Extender:Robson, Deirdre Pippins, OBED Weeks in Treatment: 3 Wound Status Wound Number: 4 Primary Trauma, Other Etiology: Wound Location: Left Knee Wound Open Wounding Event: Trauma Status: Date Acquired: 02/03/2020 Comorbid Arrhythmia, Congestive Heart  Failure, Weeks Of Treatment: 3 History: Hypertension, End Stage Renal Disease Clustered Wound: No Wound Measurements Length: (cm) 1.6 % Reduct Width: (cm) 2 % Reduct Depth: (cm) 0.2 Epitheli Area: (cm) 2.513 Tunneli Volume: (cm) 0.503 Undermi Wound Description Full Thickness Without Exposed Support Foul Odo Classification: Structures Slough/F Wound Distinct, outline attached Margin: Exudate Small Amount: Exudate Serous Type: Exudate amber Color: Wound Bed Granulation Amount: None Present (0%) Necrotic Amount: Large (67-100%) Fascia E Necrotic Quality: Eschar, Adherent Slough Fat Laye Tendon E Muscle E Joint Ex Bone Exp r After Cleansing: No ibrino Yes Exposed Structure xposed: No r (Subcutaneous Tissue) Exposed: No xposed: No xposed: No posed: No osed: No ion in Area: 6.4% ion in Volume: -87% alization: None ng: No ning: No Treatment Notes Wound #4 (Left Knee) 1. Cleanse With Wound Cleanser 2. Periwound Care Skin Prep 3. Primary Dressing Applied Collegen AG Hydrogel or K-Y Jelly 4. Secondary Dressing Foam Border Dressing 5. Secured With Office manager) Signed: 04/02/2020 5:36:36 PM By: Kela Millin Entered By: Kela Millin on 04/02/2020 14:53:03 -------------------------------------------------------------------------------- Vitals Details Patient Name: Date of Service: HIEP, OLLIS 04/02/2020 2:00 PM Medical Record Number:[id number] Patient Account Number: [id number] Date of Birth/Sex: Treating RN: 08/23/1966 (54 y.o. Marvis Repress Primary Care Antonietta Lansdowne: Nathanial Rancher Other Clinician: Referring Paz Fuentes: Treating Loudon Krakow/Extender:Robson, Deirdre Pippins, OBED Weeks in Treatment: 3 Vital Signs Time Taken: 14:44 Temperature (F): 98.2 Height (in): 69 Pulse (bpm): 69 Weight (lbs): 141 Respiratory Rate (breaths/min): 21 Body Mass Index (BMI): 20.8 Blood Pressure (mmHg): 132/89 Reference Range:  80 - 120 mg / dl Electronic Signature(s) Signed: 04/02/2020 5:36:36 PM By: Kela Millin Entered By: Kela Millin on 04/02/2020 14:44:57

## 2020-04-02 NOTE — Progress Notes (Signed)
[name] [name], [name] [name] ([id number]) Visit Report for 04/02/2020 HPI Details Patient Name: Date of Service: [name] [name], [name] [name] 04/02/2020 2:00 PM Medical Record [id number] Patient Account Number: [id number] Date of Birth/Sex: Treating RN: [birth year] (54 y.o. Janyth Contes Primary Care Provider: Nathanial Rancher Other Clinician: Referring Provider: Treating Provider/Extender:Lawanna Cecere, Deirdre Pippins, OBED Weeks in Treatment: 3 History of Present Illness HPI Description: ADMISSION 03/12/2020 This is an unfortunate 54 year old man who has multiple medical issues. He has a nonischemic cardiomyopathy which is severe with an ejection fraction of 20%. He also has chronic renal failure on dialysis Tuesday Thursday and Saturday. He is not a diabetic. The problem apparently started on 11/16/2019 he had a cardiac arrest at dialysis which was apparently a PEA cardiac arrest. He received CPR from prolonged period of time. He was admitted to hospital found to have PE and A. fib. He also had a ventricular fib cardiac arrest during the hospitalization and was noted to have a pericardial effusion. During the course of a long stay I think in the ICU he was noted to have gangrenous changes of his toes. He was seen by Dr. Trula Slade on 12/18. At the time it was felt that he had brisk Doppler signals. He was also either at that time or before on pressors which included Levophed. He apparently refused amputation of his toes. He went on to spend a prolonged period of time in rehab noted have gangrene of the toes as well. He has home health changing the dressing which is father also does which is a Betadine wet-to-dry. Past medical history includes a nonischemic cardiomyopathy, chronic renal failure on dialysis, pleural effusion, atrial fibrillation on Eliquis, polysubstance abuse. ABIs in our clinic were 1.1 on the right and 0.76 on the left 4/5; the patient saw Dr. Doren Custard on 03/14/2020. He has a follow-up with Dr. Ramon Dredge him on  4/5. He had arterial Dopplers done on that day. His ABIs were 100%. He had triphasic dorsalis pedis and posterior tibial. On the left side he had triphasic dorsalis pedis and posterior tibial signal ABI at 100%. Toe pressures could not be obtained on either side. 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 There is extensive wounds on his dorsal first toe bilaterally. Left greater than right. His forefoot is cold and extremely painful. Interestingly he also has eschared areas on both patellae which are also cold. 4/16 the patient saw Dr. Trula Slade on 4/12. He noted normal ABIs on the right of 1.31 and on the left at 1.22. He felt his ischemic changes were secondary to pressors from his PEA cardiac arrest. The patient was still refusing amputation. He has ischemic change in all of his toes. On the left there is been some exposure of healthy tissue on the right there is pretty much full gangrenous change over his the majority of his toes. He still has a lot of pain in this area Electronic Signature(s) Signed: 04/02/2020 5:34:04 PM By: Linton Ham MD Entered By: Linton Ham on 04/02/2020 15:34:24 -------------------------------------------------------------------------------- Physical Exam Details Patient Name: Date of Service: [name] [name], [name] [name] 04/02/2020 2:00 PM Medical Record Number:[id number] Patient Account Number: [id number] Date of Birth/Sex: Treating RN: 04-28-1966 (54 y.o. Janyth Contes Primary Care Provider: Nathanial Rancher Other Clinician: Referring Provider: Treating Provider/Extender:Ninamarie Keel, Deirdre Pippins, OBED Weeks in Treatment: 3 Constitutional Sitting or standing Blood Pressure is within target range for patient.. Pulse regular and within target range for patient.Marland Kitchen Respirations regular, non-labored and within target range.. Temperature is normal and within the target  range for the patient.Marland Kitchen Appears in no distress. Cardiovascular Pedal pulses  are palpable. Notes Wound exam; every toe has dry gangrene. On the left side there is some tissue that is left open at the base of several of his toes especially the left first toe. On the right there is still almost complete gangrenous change. Some of the eschar is separating on the left but not the right. He has open areas on both patella. I have never really understood this whether these were pressure ulcers at the original part of his original injuries or not. The eschar here is separated. We are going to use silver collagen on the wound area Electronic Signature(s) Signed: 04/02/2020 5:34:04 PM By: Linton Ham MD Entered By: Linton Ham on 04/02/2020 15:36:16 -------------------------------------------------------------------------------- Physician Orders Details Patient Name: Date of Service: [name] [name], [name] [name] 04/02/2020 2:00 PM Medical Record Number:[id number] Patient Account Number: [id number] Date of Birth/Sex: Treating RN: [birth year] (54 y.o. 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 Dressing Change Frequency Change dressing every day. - all wounds Wound Cleansing Clean wound with Normal Saline. Primary Wound Dressing Wound #1 Left Foot Other: - wrap toes with betadine moistened gauze Wound #2 Right Foot Other: - wrap toes with  betadine moistened gauze Wound #3 Right Knee Silver Collagen - moisten with hydrogel or KY jelly Wound #4 Left Knee Silver Collagen - moisten with hydrogel or KY jelly Secondary Dressing Wound #1 Left Foot Kerlix/Rolled Gauze Dry Gauze Wound #2 Right Foot Kerlix/Rolled Gauze Dry Gauze Wound #3 Right Knee Foam Border - or dry gauze and kerlix Wound #4 Left Knee Foam Border - or dry gauze and kerlix [facility] for wound care. - [facility] to change twice a week. family and/or patient to change all other days. Electronic Signature(s) Signed: 04/02/2020 5:34:04 PM By: Linton Ham MD Signed: 04/02/2020 6:02:31 PM By: Levan Hurst RN, BSN Entered By: Levan Hurst on 04/02/2020 15:18:46 -------------------------------------------------------------------------------- Problem List Details Patient Name: Date of Service: [name] [name], [name] [name] 04/02/2020 2:00 PM Medical Record Number:[id number] Patient Account Number: [id number] Date of Birth/Sex: Treating RN: December 09, 1966 (54 y.o. Janyth Contes Primary Care Provider: Nathanial Rancher Other Clinician: Referring Provider: Treating Provider/Extender:Janda Cargo, Deirdre Pippins, OBED Weeks in Treatment: 3 Active Problems ICD-10 Evaluated Encounter Code Description Active Date Today Diagnosis I70.235 Atherosclerosis of native arteries of right leg with 03/12/2020 No Yes ulceration of other part of foot I70.245 Atherosclerosis of native arteries of left leg with 03/12/2020 No Yes ulceration of other part of foot I70.261 Atherosclerosis of native arteries of extremities with 03/12/2020 No Yes gangrene, right leg I70.262 Atherosclerosis of native arteries of extremities with 03/12/2020 No Yes gangrene, left leg I42.9 Cardiomyopathy, unspecified 03/12/2020 No Yes Inactive Problems Resolved Problems Electronic Signature(s) Signed: 04/02/2020 5:34:04 PM By: Linton Ham MD Entered By: Linton Ham  on 04/02/2020 15:32:44 -------------------------------------------------------------------------------- Progress Note Details Patient Name: Date of Service: [name] [name], [name] [name] 04/02/2020 2:00 PM Medical Record [id number] Patient Account Number: [id number] Date of Birth/Sex: Treating RN: 1966-08-05 (  54 y.o. Janyth Contes Primary Care Provider: Nathanial Rancher Other Clinician: Referring Provider: Treating Provider/Extender:Shacarra Choe, Deirdre Pippins, OBED Weeks in Treatment: 3 Subjective History of Present Illness (HPI) ADMISSION 03/12/2020 This is an unfortunate 54 year old man who has multiple medical issues. He has a nonischemic cardiomyopathy which is severe with an ejection fraction of 20%. He also has chronic renal failure on dialysis Tuesday Thursday and Saturday. He is not a diabetic. The problem apparently started on 11/16/2019 he had a cardiac arrest at dialysis which was apparently a PEA cardiac arrest. He received CPR from prolonged period of time. He was admitted to hospital found to have PE and A. fib. He also had a ventricular fib cardiac arrest during the hospitalization and was noted to have a pericardial effusion. During the course of a long stay I think in the ICU he was noted to have gangrenous changes of his toes. He was seen by Dr. Trula Slade on 12/18. At the time it was felt that he had brisk Doppler signals. He was also either at that time or before on pressors which included Levophed. He apparently refused amputation of his toes. He went on to spend a prolonged period of time in rehab noted have gangrene of the toes as well. He has home health changing the dressing which is father also does which is a Betadine wet-to-dry. Past medical history includes a nonischemic cardiomyopathy, chronic renal failure on dialysis, pleural effusion, atrial fibrillation on Eliquis, polysubstance abuse. ABIs in our clinic were 1.1 on the right and 0.76 on the left 4/5; the patient saw Dr.  Doren Custard on 03/14/2020. He has a follow-up with Dr. Ramon Dredge him on 4/5. He had arterial Dopplers done on that day. His ABIs were 100%. He had triphasic dorsalis pedis and posterior tibial. On the left side he had triphasic dorsalis pedis and posterior tibial signal ABI at 100%. Toe pressures could not be obtained on either side. 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 There is extensive wounds on his dorsal first toe bilaterally. Left greater than right. His forefoot is cold and extremely painful. Interestingly he also has eschared areas on both patellae which are also cold. 4/16 the patient saw Dr. Trula Slade on 4/12. He noted normal ABIs on the right of 1.31 and on the left at 1.22. He felt his ischemic changes were secondary to pressors from his PEA cardiac arrest. The patient was still refusing amputation. He has ischemic change in all of his toes. On the left there is been some exposure of healthy tissue on the right there is pretty much full gangrenous change over his the majority of his toes. He still has a lot of pain in this area Objective Constitutional Sitting or standing Blood Pressure is within target range for patient.. Pulse regular and within target range for patient.Marland Kitchen Respirations regular, non-labored and within target range.. Temperature is normal and within the target range for the patient.Marland Kitchen Appears in no distress. Vitals Time Taken: 2:44 PM, Height: 69 in, Weight: 141 lbs, BMI: 20.8, Temperature: 98.2 F, Pulse: 69 bpm, Respiratory Rate: 21 breaths/min, Blood Pressure: 132/89 mmHg. Cardiovascular Pedal pulses are palpable. General Notes: Wound exam; every toe has dry gangrene. On the left side there is some tissue that is left open at the base of several of his toes especially the left first toe. On the right there is still almost complete gangrenous change. Some of the eschar is separating on the left but not the right. ooHe  has open areas on both  patella. I have never really understood this whether these were pressure ulcers at the original part of his original injuries or not. The eschar here is separated. We are going to use silver collagen on the wound area Integumentary (Hair, Skin) Wound #1 status is Open. Original cause of wound was Gradually Appeared. The wound is located on the Left Foot. The wound measures 7.5cm length x 12.4cm width x 0.1cm depth; 73.042cm^2 area and 7.304cm^3 volume. There is Fat Layer (Subcutaneous Tissue) Exposed exposed. There is no tunneling or undermining noted. There is a medium amount of serous drainage noted. The wound margin is distinct with the outline attached to the wound base. There is small (1-33%) pink granulation within the wound bed. There is a large (67-100%) amount of necrotic tissue within the wound bed including Eschar and Adherent Slough. Wound #2 status is Open. Original cause of wound was Gradually Appeared. The wound is located on the Right Foot. The wound measures 4.1cm length x 13cm width x 0.1cm depth; 41.862cm^2 area and 4.186cm^3 volume. There is Fat Layer (Subcutaneous Tissue) Exposed exposed. There is no tunneling or undermining noted. There is a medium amount of serous drainage noted. The wound margin is distinct with the outline attached to the wound base. There is small (1-33%) pink granulation within the wound bed. There is a large (67-100%) amount of necrotic tissue within the wound bed including Eschar and Adherent Slough. Wound #3 status is Open. Original cause of wound was Gradually Appeared. The wound is located on the Right Knee. The wound measures 2.6cm length x 4cm width x 0.2cm depth; 8.168cm^2 area and 1.634cm^3 volume. There is no tunneling or undermining noted. There is a small amount of serous drainage noted. The wound margin is distinct with the outline attached to the wound base. There is no granulation within the wound bed. There is a large (67-100%) amount of  necrotic tissue within the wound bed including Eschar and Adherent Slough. Wound #4 status is Open. Original cause of wound was Trauma. The wound is located on the Left Knee. The wound measures 1.6cm length x 2cm width x 0.2cm depth; 2.513cm^2 area and 0.503cm^3 volume. There is no tunneling or undermining noted. There is a small amount of serous drainage noted. The wound margin is distinct with the outline attached to the wound base. There is no granulation within the wound bed. There is a large (67-100%) amount of necrotic tissue within the wound bed including Eschar and Adherent Slough. 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 Dressing Change Frequency: Change dressing every day. - all wounds Wound Cleansing: Clean wound with Normal Saline. Primary Wound Dressing: Wound #1 Left Foot: Other: - wrap toes with betadine moistened gauze Wound #2 Right Foot: Other: - wrap toes with betadine moistened gauze Wound #3 Right Knee: Silver Collagen - moisten with hydrogel or KY jelly Wound #4 Left Knee: Silver Collagen - moisten with hydrogel or KY jelly Secondary Dressing: Wound #1 Left Foot: Kerlix/Rolled Gauze Dry Gauze Wound #2 Right Foot: Kerlix/Rolled Gauze Dry Gauze Wound #3 Right Knee: Foam Border - or dry gauze and kerlix Wound #4 Left Knee: Foam Border -  or dry gauze and kerlix Home Health: [facility] for wound care. - [facility] to change twice a week. family and/or patient to change all other days. 1. The areas on the left and right patella  area. Were going to change to silver collagen 2. On the ischemic toes continue with Betadine gauze 3. Vascular surgery thinks that these were due to pressors he received at the end of December at the time of his PEA arrest. I wonder whether he has microvascular disease as well. 4. As stated by Dr. Trula Slade and he refuses to consider amputations and neck continues today. His brother emphasized a quote from Dr. Ramon Dredge him that these "might heal". While I think that is unlikely that is what they are holding onto. Electronic Signature(s) Signed: 04/02/2020 5:34:04 PM By: Linton Ham MD Entered By: Linton Ham on 04/02/2020 15:37:57 -------------------------------------------------------------------------------- SuperBill Details Patient Name: Date of Service: [name] [name], [name] [name] 04/02/2020 Medical Record [id number] Patient Account Number: [id number] Date of Birth/Sex: Treating RN: 03/07/1966 (54 y.o. 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, left leg I42.9 Cardiomyopathy, unspecified Facility Procedures CPT4 Code: [id number] Description: 84696 - WOUND CARE VISIT-LEV 5 EST PT Modifier: Quantity: 1 Physician Procedures CPT4 Code Description: [id number] 32440 - WC PHYS LEVEL 3 - EST PT ICD-10 Diagnosis Description I70.235 Atherosclerosis of native arteries of right leg with ulceratio I70.245 Atherosclerosis of native arteries of left leg with ulceration I70.261  Atherosclerosis of  native arteries of extremities with gangren I70.262 Atherosclerosis of native arteries of extremities with gangren Modifier: n of other pa of other par e, right leg e, left leg Quantity: 1 rt of foot t of foot Electronic Signature(s) Signed: 04/02/2020 5:34:04 PM By: Linton Ham MD Entered By: Linton Ham on 04/02/2020 15:38:30

## 2020-04-03 DIAGNOSIS — N186 End stage renal disease: Secondary | ICD-10-CM | POA: Diagnosis not present

## 2020-04-03 DIAGNOSIS — N2581 Secondary hyperparathyroidism of renal origin: Secondary | ICD-10-CM | POA: Diagnosis not present

## 2020-04-03 DIAGNOSIS — Z992 Dependence on renal dialysis: Secondary | ICD-10-CM | POA: Diagnosis not present

## 2020-04-04 DIAGNOSIS — S52571D Other intraarticular fracture of lower end of right radius, subsequent encounter for closed fracture with routine healing: Secondary | ICD-10-CM | POA: Diagnosis not present

## 2020-04-05 DIAGNOSIS — N186 End stage renal disease: Secondary | ICD-10-CM | POA: Diagnosis not present

## 2020-04-05 DIAGNOSIS — N2581 Secondary hyperparathyroidism of renal origin: Secondary | ICD-10-CM | POA: Diagnosis not present

## 2020-04-05 DIAGNOSIS — Z992 Dependence on renal dialysis: Secondary | ICD-10-CM | POA: Diagnosis not present

## 2020-04-06 ENCOUNTER — Encounter
Payer: Medicare Other | Attending: Physical Medicine and Rehabilitation | Admitting: Physical Medicine and Rehabilitation

## 2020-04-06 ENCOUNTER — Other Ambulatory Visit: Payer: Self-pay

## 2020-04-06 ENCOUNTER — Encounter: Payer: Self-pay | Admitting: Physical Medicine and Rehabilitation

## 2020-04-06 VITALS — BP 87/58 | HR 66 | Temp 97.5°F | Ht 69.0 in | Wt 150.0 lb

## 2020-04-06 DIAGNOSIS — M25579 Pain in unspecified ankle and joints of unspecified foot: Secondary | ICD-10-CM | POA: Diagnosis not present

## 2020-04-06 DIAGNOSIS — I96 Gangrene, not elsewhere classified: Secondary | ICD-10-CM

## 2020-04-06 DIAGNOSIS — R5381 Other malaise: Secondary | ICD-10-CM

## 2020-04-06 MED ORDER — RENA-VITE PO TABS: 1.0000 | ORAL_TABLET | Freq: Every day | ORAL | 0 refills | Status: AC

## 2020-04-06 MED ORDER — OXYCODONE-ACETAMINOPHEN 7.5-325 MG PO TABS: 1.0000 | ORAL_TABLET | ORAL | 0 refills | Status: AC | PRN

## 2020-04-06 MED ORDER — DOCUSATE SODIUM 100 MG PO CAPS: 100.0000 mg | ORAL_CAPSULE | Freq: Every day | ORAL | 2 refills | Status: AC

## 2020-04-06 MED ORDER — AMITRIPTYLINE HCL 10 MG PO TABS: 10.0000 mg | ORAL_TABLET | Freq: Every day | ORAL | 3 refills | Status: DC

## 2020-04-06 MED ORDER — SENNOSIDES-DOCUSATE SODIUM 8.6-50 MG PO TABS: 1.0000 | ORAL_TABLET | Freq: Every day | ORAL | 0 refills | Status: AC

## 2020-04-06 NOTE — Progress Notes (Signed)
Subjective:    Patient ID: [name] [name], male    DOB: 12-03-1966, 54 y.o.   MRN: [id number]  HPI  [name] [name] was last seen on 3/24 and presents for 1 month follow-up of debility.   At last visit he had not yet received any home health therapy. I had followed up with SW and he is now receiving HHT twice per week with both physical and occupational therapy. He has a nurse that comes twice per week and performs wound care those days. His father performs wound care on other days. His father has had caregiver training in the hospital and has been following the instructions he received. He has been told by wound care nurse that the wounds are healing well, but still feels nervous. [name] [name] feels he would develop deep depression if he were to require amputation.   [name] [name] has been ambulating around his home a few times per day in order to use the bathroom. His ambulation is very limited due to the pain in his bilateral feet. He has also broken his right arm after falling 2 weeks prior. He has been taking Tramadol 50mg  q6H, but does not find it to be beneficial. He has felt benefit from Oxycodone in the past but his father is worried about possible addiction.  He is sleeping very poorly at night.     All other medications reviewed and he does not require additional refills, except for his two OTC laxatives and his multivitamin. He has a PCP and his father has tried to schedule PCP follow-up to no avail since discharge. He says the last time he has seen his PCP was last year.    Pain Inventory Average Pain 10 Pain Right Now 10 My pain is constant, sharp, burning, tingling and aching  In the last 24 hours, has pain interfered with the following? General activity 0 Relation with others 0 Enjoyment of life 0 What TIME of day is your pain at its worst? all Sleep (in general) Poor  Pain is worse with: walking, bending, sitting, standing and some activites Pain improves with: medication Relief  from Meds: 5  Mobility walk with assistance ability to climb steps?  no do you drive?  no use a wheelchair needs help with transfers  Function disabled: date disabled 8 yrs ago I need assistance with the following:  dressing, bathing, toileting, meal prep, household duties and shopping  Neuro/Psych trouble walking  Prior Studies Any changes since last visit?  yes  [place] and broke right arm when reaching for walker-- sees hand a rehab MD on [employer] involved in your care Any changes since last visit?  no   Family History  Problem Relation Age of Onset  . Hypertension Mother   . Diabetes Mother   . Renal Disease Mother   . Hypertension Father    Social History   Socioeconomic History  . Marital status: Single    Spouse name: Not on file  . Number of children: Not on file  . Years of education: Not on file  . Highest education level: Not on file  Occupational History  . Not on file  Tobacco Use  . Smoking status: Former Smoker    Years: 30.00    Types: Cigarettes    Quit date: 10/20/2018    Years since quitting: 1.4  . Smokeless tobacco: Never Used  Substance and Sexual Activity  . Alcohol use: Yes    Alcohol/week: 4.0 standard drinks  Types: 4 Cans of beer per week  . Drug use: No    Types: Marijuana    Comment: 05/18/2017 "qd"  . Sexual activity: Not Currently  Other Topics Concern  . Not on file  Social History Narrative  . Not on file   Social Determinants of Health   Financial Resource Strain:   . Difficulty of Paying Living Expenses:   Food Insecurity:   . Worried About Charity fundraiser in the Last Year:   . Arboriculturist in the Last Year:   Transportation Needs:   . Film/video editor (Medical):   Marland Kitchen Lack of Transportation (Non-Medical):   Physical Activity:   . Days of Exercise per Week:   . Minutes of Exercise per Session:   Stress:   . Feeling of Stress :   Social Connections:   . Frequency of Communication with  Friends and Family:   . Frequency of Social Gatherings with Friends and Family:   . Attends Religious Services:   . Active Member of Clubs or Organizations:   . Attends Archivist Meetings:   Marland Kitchen Marital Status:    Past Surgical History:  Procedure Laterality Date  . [place] TRANSPOSITION  01/19/2013   Procedure: BASCILIC VEIN TRANSPOSITION;  Surgeon: Conrad [place]ville, MD;  Location: [facility];  Service: Vascular;  Laterality: Left;  left 2nd stage basilic vein transposition  . CARDIAC CATHETERIZATION N/A 02/25/2016   Procedure: Right/Left Heart Cath and Coronary Angiography;  Surgeon: Burnell Blanks, MD;  Location: [facility] CV LAB;  Service: Cardiovascular;  Laterality: N/A;  . INGUINAL HERNIA REPAIR Right 08/05/2016   Procedure: RIGHT INGUINAL HERNIA REPAIR WITH MESH;  Surgeon: Donnie Mesa, MD;  Location: [facility];  Service: General;  Laterality: Right;  . INSERTION OF DIALYSIS CATHETER  07/17/2012   Procedure: INSERTION OF DIALYSIS CATHETER;  Surgeon: Conrad Pocahontas, MD;  Location: [facility];  Service: Vascular;  Laterality: Right;  right internal jugular  . INSERTION OF MESH Right 08/05/2016   Procedure: INSERTION OF MESH;  Surgeon: Donnie Mesa, MD;  Location: [facility];  Service: General;  Laterality: Right;  . IR GASTROSTOMY TUBE MOD SED  01/09/2020  . IR GASTROSTOMY TUBE REMOVAL  03/28/2020  . IR PATIENT EVAL TECH 0-60 MINS  03/20/2020  . IR THORACENTESIS ASP PLEURAL SPACE W/IMG GUIDE  01/12/2020  . IR THORACENTESIS ASP PLEURAL SPACE W/IMG GUIDE  02/21/2020  . REVISION OF ARTERIOVENOUS GORETEX GRAFT Left 0/16/0109   Procedure: PLICATION OF ARTERIOVENOUS FISTULA LEFT ARM;  Surgeon: Elam Dutch, MD;  Location: [facility];  Service: Vascular;  Laterality: Left;  . REVISON OF ARTERIOVENOUS FISTULA Left 01/06/2017   Procedure: REVISON OF ARTERIOVENOUS FISTULA;  Surgeon: Conrad [place], MD;  Location: [facility];  Service: Vascular;  Laterality: Left;  . REVISON OF ARTERIOVENOUS FISTULA Left  10/27/2018   Procedure: REVISION PLICATION OF ARTERIOVENOUS FISTULA  LEFT UPPER ARM;  Surgeon: Waynetta Sandy, MD;  Location: [facility];  Service: Vascular;  Laterality: Left;  . TRACHEOSTOMY TUBE PLACEMENT N/A 11/24/2019   Procedure: TRACHEOSTOMY;  Surgeon: Izora Gala, MD;  Location: [facility];  Service: ENT;  Laterality: N/A;  . UMBILICAL HERNIA REPAIR N/A 08/05/2016   Procedure: [place];  Surgeon: Donnie Mesa, MD;  Location: [facility];  Service: General;  Laterality: N/A;  . VENOGRAM N/A 08/09/2012   Procedure: VENOGRAM;  Surgeon: Conrad [place], MD;  Location: [facility] CATH LAB;  Service: Cardiovascular;  Laterality: N/A;   Past Medical History:  Diagnosis Date  . Abdominal distension 08/10/2018  . Acute on chronic respiratory failure with hypoxia ([place])   . Acute on chronic systolic and diastolic heart failure, NYHA class 4 ([place])   . Acute pulmonary embolism without acute cor pulmonale (HCC)   . Alcohol abuse 11/26/2010   Qualifier: Diagnosis of  By: [name] MD, Pershing Proud  . Anemia due to blood loss, acute 07/15/2012  . CHF (congestive heart failure) ([place])   . Cholecystitis 04/18/2014  . Chronic atrial fibrillation ([place])   . Cigarette smoker    has currently quit  . Cocaine abuse ([place]) 11/26/2010   Qualifier: Diagnosis of  By: [name] MD, Benjamine Mola  has been over a year  . 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 Echo 8/13: Severe LVH, EF 40-45%, inferoposterior akinesis, grade 2 diastolic dysfunction, moderate LAE, mild RVE, mildly reduced RVSF, mild RAE; cannot rule out R atrial mass-suggest TEE or cardiac MRI  //  B. Echo 3/17: Mild LVH, EF 25-30%, diffuse HK, grade 2 diastolic dysfunction, mild MR, severe LAE,  moderately reduced RVSF, severe RAE, mild TR, moderate PI, PASP 55 mmHg    . Dyspnea   . End stage renal disease on dialysis ([place])   . ESRD (end stage renal disease) on dialysis [facility])    MWF- [place]  (05/18/2017)  . Hemodialysis patient ([place])    M,W,F  . Hernia, inguinal, right 08/05/2016  . Hypertension   . 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 Hypocalcemia 07/15/2012  . Medical non-compliance   . Non-ischemic cardiomyopathy ([place])    A. R/L HC 3/17: Normal coronary arteries, moderate pulmonary hypertension (PASP 65 mmHg), elevated LV filling pressures (LVEDP 45 mmHg)   . NSVT (nonsustained ventricular tachycardia) ([place]) 02/22/2016  . NSVT (nonsustained ventricular tachycardia) ([place]) 02/22/2016  . Pneumonia 11/12/2017  . Polysubstance abuse ([place])   . Prolonged Q-T interval on ECG 05/18/2017  . Prolonged QT interval 05/18/2017  . Restless leg syndrome   . Solitary pulmonary nodule 10/10/2015   See cxr 10/09/2015 - CT rec 10/10/2015 >>>   . Upper airway cough syndrome 10/09/2015   Off ACEi around 1st Oct 2016  - Sinus CT 10/10/2015 >>>     BP (!) 87/58   Pulse 66   Temp (!) 97.5 F (36.4 C)   Ht 5\' 9"  (1.753 m) Comment: last recorded  Wt 150 lb (68 kg) Comment: last recorded  SpO2 98%   BMI 22.15 kg/m   Opioid Risk Score:   Fall Risk Score:  `1  Depression screen PHQ 2/9  Depression screen [facility] 2/9 04/06/2020 03/21/2020 11/05/2018 09/17/2018 08/10/2018 12/31/2017  Decreased Interest 0 0 2 1 0 1  Down, Depressed, Hopeless 0 0 0 0 0 0  PHQ - 2 Score 0 0 2 1 0 1  Altered sleeping - - 2 3 - -  Tired, decreased energy - - 2 2 - -  Change in appetite - - 2 2 - -  Feeling bad or failure about yourself  - - 0 0 - -  Trouble concentrating - - 0 0 - -  Moving slowly or fidgety/restless - - 0 0 - -  Suicidal thoughts - - 0 0 - -  PHQ-9 Score - - 8 8 - -  Difficult doing work/chores - -  Not difficult at all Not difficult at all - -  Some recent data might be hidden   Review of Systems  Constitutional: Positive for unexpected weight change.  HENT: Negative.   Eyes:  Negative.   Respiratory: Positive for apnea, cough, shortness of breath and wheezing.   Cardiovascular: Positive for leg swelling.  Gastrointestinal: Negative.   Endocrine: Negative.   Genitourinary:       Anuric--dialysis Tuesday Thursday and saturday  Musculoskeletal: Positive for gait problem.  Skin: Positive for rash.  Hematological: Bruises/bleeds easily.       Eliquis  Psychiatric/Behavioral: Negative.   All other systems reviewed and are negative.      Objective:   Physical Exam Constitutional: No distress. Vital signs reviewed. Seated in wheelchair comfortably. HEENT: EOMI, oral membranes moist. Hoarse voice.  Neck: supple Cardiovascular: RRR without murmur. No JVD    Respiratory/Chest: CTA Bilaterally without wheezes or rales. Normal effort but on 2L oxygen. GI/Abdomen: BS +, non-tender, non-distended, PEG Ext: no clubbing, cyanosis, or edema Skin:   B/L toes gangrenous per baseline. Father able to perform dressing changes appropriately.  Psych: appears in pain; perseverating on topic.  Musc: No edema in extremities.  No tenderness in extremities. Neurological: Now OAx3.  Motor: 4- to /5 in all 4's with pain inhibition in both feet.     Assessment & Plan:   1.  Deficits with mobility, endurance, self-care secondary to encephalopathy with debility, PVD with gangrenous toes.             -Continue home therapies and nursing care.               2.  Antithrombotics:             -PE/anticoagulation: Continue Eliquis 2.5 BID. Patient does not require refill.   3. Pain Management:                -Pain contract signed last visit and UDS obtained; evaluated and contains expected metabolites.              -Stop Tramadol since not helping. Start Percocet 7.5mg  q4H as needed for severe pain. Do not use on dialysis days as can cause hypotension.  -Start Amitriptyline 10mg  HS for pain control and insomnia.   4. Constipation: Provided refills for Senna and Colace.    5.  Skin/Wound Care: Dry dressing to bilateral feet.              Observed father undressing and dressing wound and he is doing a good job. Continue daily dressings changes and surgical follow-up.              Forty minutes of face to face patient care time were spent during this visit addressing pain management, skin and wound care, insomnia, possible need for amputations. All questions were encouraged and answered. RTC in 1 month to assess progress with above interventions.

## 2020-04-07 DIAGNOSIS — N2581 Secondary hyperparathyroidism of renal origin: Secondary | ICD-10-CM | POA: Diagnosis not present

## 2020-04-07 DIAGNOSIS — N186 End stage renal disease: Secondary | ICD-10-CM | POA: Diagnosis not present

## 2020-04-07 DIAGNOSIS — Z992 Dependence on renal dialysis: Secondary | ICD-10-CM | POA: Diagnosis not present

## 2020-04-10 DIAGNOSIS — N2581 Secondary hyperparathyroidism of renal origin: Secondary | ICD-10-CM | POA: Diagnosis not present

## 2020-04-10 DIAGNOSIS — N186 End stage renal disease: Secondary | ICD-10-CM | POA: Diagnosis not present

## 2020-04-10 DIAGNOSIS — Z992 Dependence on renal dialysis: Secondary | ICD-10-CM | POA: Diagnosis not present

## 2020-04-11 ENCOUNTER — Telehealth: Payer: Self-pay | Admitting: *Deleted

## 2020-04-11 NOTE — Telephone Encounter (Signed)
Received call from [facility], PT with [facility] reporting that prior to any activity patient had O2 Sat of 87% on 2 L of oxygen that increased to 92% with deep breathing. Patient consistently dropped to 83-85% with each exercise that required extended rest to increase to 90%. Patient appeared fatigued but not in any distress. No SHOB or panting noted. Staes she did not feel he needed to be seen in ED but may require increase in O2. Please advise. Hubbard Hartshorn, BSN, RN-BC

## 2020-04-11 NOTE — Telephone Encounter (Signed)
Spoke with pt-he has dialysis tomorrow and will not be able to be seen tomorrow. Pt denies any acute shortness of breath. During conversation, the call was dropped. CMA called back and spoke with pt's father (caregiver). Pt scheduled to be seen in [facility] on Fri 4/30 and will go to ED if breathing worsens.  Discussed with Dr Dareen Piano.Despina Hidden Cassady4/28/20213:04 PM

## 2020-04-11 NOTE — Telephone Encounter (Signed)
Please have patient follow up in [facility] tomorrow. Thank you

## 2020-04-12 DIAGNOSIS — N186 End stage renal disease: Secondary | ICD-10-CM | POA: Diagnosis not present

## 2020-04-12 DIAGNOSIS — Z992 Dependence on renal dialysis: Secondary | ICD-10-CM | POA: Diagnosis not present

## 2020-04-12 DIAGNOSIS — N2581 Secondary hyperparathyroidism of renal origin: Secondary | ICD-10-CM | POA: Diagnosis not present

## 2020-04-13 ENCOUNTER — Other Ambulatory Visit: Payer: Self-pay

## 2020-04-13 ENCOUNTER — Ambulatory Visit: Payer: Medicare Other | Admitting: Internal Medicine

## 2020-04-13 ENCOUNTER — Telehealth: Payer: Self-pay | Admitting: Physical Medicine and Rehabilitation

## 2020-04-13 ENCOUNTER — Inpatient Hospital Stay (HOSPITAL_COMMUNITY)
Admission: EM | Admit: 2020-04-13 | Discharge: 2020-04-19 | DRG: 640 | Disposition: A | Discharge status: Hospice | Payer: Medicare Other | Attending: Pulmonary Disease | Admitting: Pulmonary Disease

## 2020-04-13 ENCOUNTER — Emergency Department (HOSPITAL_COMMUNITY): Payer: Medicare Other

## 2020-04-13 ENCOUNTER — Encounter (HOSPITAL_COMMUNITY): Payer: Self-pay | Admitting: Emergency Medicine

## 2020-04-13 ENCOUNTER — Inpatient Hospital Stay (HOSPITAL_COMMUNITY): Payer: Medicare Other

## 2020-04-13 ENCOUNTER — Encounter (HOSPITAL_COMMUNITY): Payer: Self-pay | Admitting: Critical Care Medicine

## 2020-04-13 ENCOUNTER — Encounter: Payer: Self-pay | Admitting: Internal Medicine

## 2020-04-13 VITALS — BP 104/76 | HR 81 | Temp 97.6°F | Wt 158.1 lb

## 2020-04-13 DIAGNOSIS — I420 Dilated cardiomyopathy: Secondary | ICD-10-CM | POA: Diagnosis present

## 2020-04-13 DIAGNOSIS — J918 Pleural effusion in other conditions classified elsewhere: Secondary | ICD-10-CM | POA: Diagnosis present

## 2020-04-13 DIAGNOSIS — E872 Acidosis: Secondary | ICD-10-CM | POA: Diagnosis present

## 2020-04-13 DIAGNOSIS — Z7952 Long term (current) use of systemic steroids: Secondary | ICD-10-CM | POA: Diagnosis not present

## 2020-04-13 DIAGNOSIS — J449 Chronic obstructive pulmonary disease, unspecified: Secondary | ICD-10-CM | POA: Diagnosis present

## 2020-04-13 DIAGNOSIS — Z86711 Personal history of pulmonary embolism: Secondary | ICD-10-CM | POA: Diagnosis not present

## 2020-04-13 DIAGNOSIS — Z20822 Contact with and (suspected) exposure to covid-19: Secondary | ICD-10-CM | POA: Diagnosis present

## 2020-04-13 DIAGNOSIS — Z992 Dependence on renal dialysis: Secondary | ICD-10-CM

## 2020-04-13 DIAGNOSIS — G8929 Other chronic pain: Secondary | ICD-10-CM | POA: Diagnosis present

## 2020-04-13 DIAGNOSIS — N250 Renal osteodystrophy: Secondary | ICD-10-CM | POA: Diagnosis not present

## 2020-04-13 DIAGNOSIS — J9690 Respiratory failure, unspecified, unspecified whether with hypoxia or hypercapnia: Secondary | ICD-10-CM | POA: Diagnosis not present

## 2020-04-13 DIAGNOSIS — Z79899 Other long term (current) drug therapy: Secondary | ICD-10-CM | POA: Diagnosis not present

## 2020-04-13 DIAGNOSIS — N2581 Secondary hyperparathyroidism of renal origin: Secondary | ICD-10-CM | POA: Diagnosis present

## 2020-04-13 DIAGNOSIS — I4891 Unspecified atrial fibrillation: Secondary | ICD-10-CM | POA: Diagnosis not present

## 2020-04-13 DIAGNOSIS — Z79890 Hormone replacement therapy (postmenopausal): Secondary | ICD-10-CM

## 2020-04-13 DIAGNOSIS — Z66 Do not resuscitate: Secondary | ICD-10-CM | POA: Diagnosis not present

## 2020-04-13 DIAGNOSIS — I472 Ventricular tachycardia: Secondary | ICD-10-CM | POA: Diagnosis present

## 2020-04-13 DIAGNOSIS — Z7901 Long term (current) use of anticoagulants: Secondary | ICD-10-CM

## 2020-04-13 DIAGNOSIS — R0602 Shortness of breath: Secondary | ICD-10-CM | POA: Diagnosis not present

## 2020-04-13 DIAGNOSIS — D631 Anemia in chronic kidney disease: Secondary | ICD-10-CM | POA: Diagnosis not present

## 2020-04-13 DIAGNOSIS — D7589 Other specified diseases of blood and blood-forming organs: Secondary | ICD-10-CM | POA: Diagnosis not present

## 2020-04-13 DIAGNOSIS — G2581 Restless legs syndrome: Secondary | ICD-10-CM | POA: Diagnosis present

## 2020-04-13 DIAGNOSIS — E8770 Fluid overload, unspecified: Principal | ICD-10-CM | POA: Diagnosis present

## 2020-04-13 DIAGNOSIS — I509 Heart failure, unspecified: Secondary | ICD-10-CM | POA: Diagnosis not present

## 2020-04-13 DIAGNOSIS — G9340 Encephalopathy, unspecified: Secondary | ICD-10-CM | POA: Diagnosis present

## 2020-04-13 DIAGNOSIS — I340 Nonrheumatic mitral (valve) insufficiency: Secondary | ICD-10-CM | POA: Diagnosis not present

## 2020-04-13 DIAGNOSIS — F809 Developmental disorder of speech and language, unspecified: Secondary | ICD-10-CM | POA: Diagnosis present

## 2020-04-13 DIAGNOSIS — I440 Atrioventricular block, first degree: Secondary | ICD-10-CM | POA: Diagnosis present

## 2020-04-13 DIAGNOSIS — J9600 Acute respiratory failure, unspecified whether with hypoxia or hypercapnia: Secondary | ICD-10-CM | POA: Diagnosis not present

## 2020-04-13 DIAGNOSIS — J9622 Acute and chronic respiratory failure with hypercapnia: Secondary | ICD-10-CM | POA: Diagnosis not present

## 2020-04-13 DIAGNOSIS — I132 Hypertensive heart and chronic kidney disease with heart failure and with stage 5 chronic kidney disease, or end stage renal disease: Secondary | ICD-10-CM | POA: Diagnosis present

## 2020-04-13 DIAGNOSIS — E039 Hypothyroidism, unspecified: Secondary | ICD-10-CM | POA: Diagnosis not present

## 2020-04-13 DIAGNOSIS — I5042 Chronic combined systolic (congestive) and diastolic (congestive) heart failure: Secondary | ICD-10-CM | POA: Diagnosis present

## 2020-04-13 DIAGNOSIS — I5084 End stage heart failure: Secondary | ICD-10-CM | POA: Diagnosis present

## 2020-04-13 DIAGNOSIS — I5082 Biventricular heart failure: Secondary | ICD-10-CM | POA: Diagnosis present

## 2020-04-13 DIAGNOSIS — I482 Chronic atrial fibrillation, unspecified: Secondary | ICD-10-CM | POA: Diagnosis present

## 2020-04-13 DIAGNOSIS — J90 Pleural effusion, not elsewhere classified: Secondary | ICD-10-CM | POA: Diagnosis not present

## 2020-04-13 DIAGNOSIS — J9601 Acute respiratory failure with hypoxia: Secondary | ICD-10-CM | POA: Diagnosis not present

## 2020-04-13 DIAGNOSIS — N186 End stage renal disease: Secondary | ICD-10-CM | POA: Diagnosis not present

## 2020-04-13 DIAGNOSIS — J9811 Atelectasis: Secondary | ICD-10-CM | POA: Diagnosis not present

## 2020-04-13 DIAGNOSIS — I480 Paroxysmal atrial fibrillation: Secondary | ICD-10-CM | POA: Diagnosis present

## 2020-04-13 DIAGNOSIS — J810 Acute pulmonary edema: Secondary | ICD-10-CM | POA: Diagnosis not present

## 2020-04-13 DIAGNOSIS — J9621 Acute and chronic respiratory failure with hypoxia: Secondary | ICD-10-CM | POA: Diagnosis present

## 2020-04-13 DIAGNOSIS — Z515 Encounter for palliative care: Secondary | ICD-10-CM | POA: Diagnosis not present

## 2020-04-13 DIAGNOSIS — Z87891 Personal history of nicotine dependence: Secondary | ICD-10-CM

## 2020-04-13 DIAGNOSIS — Z9119 Patient's noncompliance with other medical treatment and regimen: Secondary | ICD-10-CM

## 2020-04-13 DIAGNOSIS — Z5320 Procedure and treatment not carried out because of patient's decision for unspecified reasons: Secondary | ICD-10-CM | POA: Diagnosis present

## 2020-04-13 DIAGNOSIS — D638 Anemia in other chronic diseases classified elsewhere: Secondary | ICD-10-CM | POA: Diagnosis present

## 2020-04-13 DIAGNOSIS — J9620 Acute and chronic respiratory failure, unspecified whether with hypoxia or hypercapnia: Secondary | ICD-10-CM

## 2020-04-13 DIAGNOSIS — Z9115 Patient's noncompliance with renal dialysis: Secondary | ICD-10-CM | POA: Diagnosis not present

## 2020-04-13 DIAGNOSIS — J9602 Acute respiratory failure with hypercapnia: Secondary | ICD-10-CM | POA: Diagnosis not present

## 2020-04-13 DIAGNOSIS — I5023 Acute on chronic systolic (congestive) heart failure: Secondary | ICD-10-CM | POA: Diagnosis not present

## 2020-04-13 DIAGNOSIS — I4510 Unspecified right bundle-branch block: Secondary | ICD-10-CM | POA: Diagnosis present

## 2020-04-13 HISTORY — PX: IR THORACENTESIS ASP PLEURAL SPACE W/IMG GUIDE: IMG5380

## 2020-04-13 LAB — CBC WITH DIFFERENTIAL/PLATELET
Abs Immature Granulocytes: 0 10*3/uL (ref 0.00–0.07)
Basophils Absolute: 0 10*3/uL (ref 0.0–0.1)
Basophils Relative: 0 %
Eosinophils Absolute: 0.1 10*3/uL (ref 0.0–0.5)
Eosinophils Relative: 1 %
HCT: 44.9 % (ref 39.0–52.0)
Hemoglobin: 12.7 g/dL — ABNORMAL LOW (ref 13.0–17.0)
Lymphocytes Relative: 10 %
Lymphs Abs: 1.1 10*3/uL (ref 0.7–4.0)
MCH: 29.1 pg (ref 26.0–34.0)
MCHC: 28.3 g/dL — ABNORMAL LOW (ref 30.0–36.0)
MCV: 102.7 fL — ABNORMAL HIGH (ref 80.0–100.0)
Monocytes Absolute: 1.1 10*3/uL — ABNORMAL HIGH (ref 0.1–1.0)
Monocytes Relative: 10 %
Neutro Abs: 8.8 10*3/uL — ABNORMAL HIGH (ref 1.7–7.7)
Neutrophils Relative %: 79 %
Platelets: 221 10*3/uL (ref 150–400)
RBC: 4.37 MIL/uL (ref 4.22–5.81)
RDW: 25.9 % — ABNORMAL HIGH (ref 11.5–15.5)
WBC: 11.1 10*3/uL — ABNORMAL HIGH (ref 4.0–10.5)
nRBC: 1.1 % — ABNORMAL HIGH (ref 0.0–0.2)
nRBC: 2 /100 WBC — ABNORMAL HIGH

## 2020-04-13 LAB — COMPREHENSIVE METABOLIC PANEL
ALT: 19 U/L (ref 0–44)
AST: 16 U/L (ref 15–41)
Albumin: 3.2 g/dL — ABNORMAL LOW (ref 3.5–5.0)
Alkaline Phosphatase: 124 U/L (ref 38–126)
Anion gap: 16 — ABNORMAL HIGH (ref 5–15)
BUN: 36 mg/dL — ABNORMAL HIGH (ref 6–20)
CO2: 31 mmol/L (ref 22–32)
Calcium: 8.7 mg/dL — ABNORMAL LOW (ref 8.9–10.3)
Chloride: 93 mmol/L — ABNORMAL LOW (ref 98–111)
Creatinine, Ser: 5.81 mg/dL — ABNORMAL HIGH (ref 0.61–1.24)
GFR calc Af Amer: 12 mL/min — ABNORMAL LOW (ref >60)
GFR calc non Af Amer: 10 mL/min — ABNORMAL LOW (ref >60)
Glucose, Bld: 118 mg/dL — ABNORMAL HIGH (ref 70–99)
Potassium: 4.3 mmol/L (ref 3.5–5.1)
Sodium: 140 mmol/L (ref 135–145)
Total Bilirubin: 0.8 mg/dL (ref 0.3–1.2)
Total Protein: 6.4 g/dL — ABNORMAL LOW (ref 6.5–8.1)

## 2020-04-13 LAB — POCT I-STAT EG7
Acid-Base Excess: 2 mmol/L (ref 0.0–2.0)
Bicarbonate: 32.6 mmol/L — ABNORMAL HIGH (ref 20.0–28.0)
Calcium, Ion: 1.08 mmol/L — ABNORMAL LOW (ref 1.15–1.40)
HCT: 48 % (ref 39.0–52.0)
Hemoglobin: 16.3 g/dL (ref 13.0–17.0)
O2 Saturation: 99 %
Potassium: 4.3 mmol/L (ref 3.5–5.1)
Sodium: 137 mmol/L (ref 135–145)
TCO2: 35 mmol/L — ABNORMAL HIGH (ref 22–32)
pCO2, Ven: 79 mmHg (ref 44.0–60.0)
pH, Ven: 7.223 — ABNORMAL LOW (ref 7.250–7.430)
pO2, Ven: 153 mmHg — ABNORMAL HIGH (ref 32.0–45.0)

## 2020-04-13 LAB — RESPIRATORY PANEL BY RT PCR (FLU A&B, COVID)
Influenza A by PCR: NEGATIVE
Influenza B by PCR: NEGATIVE
SARS Coronavirus 2 by RT PCR: NEGATIVE

## 2020-04-13 LAB — TROPONIN I (HIGH SENSITIVITY)
Troponin I (High Sensitivity): 230 ng/L (ref <18)
Troponin I (High Sensitivity): 242 ng/L (ref <18)

## 2020-04-13 LAB — HIV ANTIBODY (ROUTINE TESTING W REFLEX): HIV Screen 4th Generation wRfx: NONREACTIVE

## 2020-04-13 MED ORDER — THIAMINE HCL 100 MG PO TABS
100.0000 mg | ORAL_TABLET | Freq: Every day | ORAL | Status: DC
  Administered 2020-04-13 – 2020-04-19 (×7): 100 mg via ORAL
  Filled 2020-04-13 (×7): qty 1

## 2020-04-13 MED ORDER — SODIUM CHLORIDE 0.9% FLUSH: 3.0000 mL | Freq: Once | INTRAVENOUS | Status: DC

## 2020-04-13 MED ORDER — DOCUSATE SODIUM 100 MG PO CAPS
100.0000 mg | ORAL_CAPSULE | Freq: Every day | ORAL | Status: DC
  Administered 2020-04-14 – 2020-04-19 (×6): 100 mg via ORAL
  Filled 2020-04-13 (×6): qty 1

## 2020-04-13 MED ORDER — AMIODARONE HCL 200 MG PO TABS
200.0000 mg | ORAL_TABLET | Freq: Every day | ORAL | Status: DC
  Administered 2020-04-14 – 2020-04-17 (×4): 200 mg via ORAL
  Filled 2020-04-13 (×4): qty 1

## 2020-04-13 MED ORDER — DM-GUAIFENESIN ER 30-600 MG PO TB12
1.0000 | ORAL_TABLET | Freq: Two times a day (BID) | ORAL | Status: DC
  Administered 2020-04-13 – 2020-04-17 (×8): 1 via ORAL
  Filled 2020-04-13 (×10): qty 1

## 2020-04-13 MED ORDER — FOLIC ACID 1 MG PO TABS
1.0000 mg | ORAL_TABLET | Freq: Every day | ORAL | Status: DC
  Administered 2020-04-14 – 2020-04-19 (×6): 1 mg via ORAL
  Filled 2020-04-13 (×6): qty 1

## 2020-04-13 MED ORDER — PREDNISONE 10 MG PO TABS
10.0000 mg | ORAL_TABLET | Freq: Every day | ORAL | Status: DC
  Administered 2020-04-14 – 2020-04-19 (×6): 10 mg via ORAL
  Filled 2020-04-13 (×6): qty 1

## 2020-04-13 MED ORDER — LIDOCAINE HCL (PF) 1 % IJ SOLN
INTRAMUSCULAR | Status: DC | PRN
  Administered 2020-04-13: 10 mL

## 2020-04-13 MED ORDER — MIDODRINE HCL 5 MG PO TABS
10.0000 mg | ORAL_TABLET | Freq: Three times a day (TID) | ORAL | Status: DC
  Administered 2020-04-14 – 2020-04-19 (×13): 10 mg via ORAL
  Filled 2020-04-13 (×15): qty 2

## 2020-04-13 MED ORDER — COLCHICINE 0.6 MG PO TABS: 0.6000 mg | ORAL_TABLET | Freq: Every day | ORAL | Status: DC

## 2020-04-13 MED ORDER — PREGABALIN 50 MG PO CAPS
50.0000 mg | ORAL_CAPSULE | Freq: Every day | ORAL | Status: DC
  Administered 2020-04-13 – 2020-04-18 (×5): 50 mg via ORAL
  Filled 2020-04-13: qty 1
  Filled 2020-04-13 (×4): qty 2

## 2020-04-13 MED ORDER — AMITRIPTYLINE HCL 10 MG PO TABS
10.0000 mg | ORAL_TABLET | Freq: Every day | ORAL | Status: DC
  Administered 2020-04-13 – 2020-04-16 (×4): 10 mg via ORAL
  Filled 2020-04-13 (×5): qty 1

## 2020-04-13 MED ORDER — ACETAMINOPHEN 650 MG RE SUPP: 650.0000 mg | Freq: Four times a day (QID) | RECTAL | Status: DC | PRN

## 2020-04-13 MED ORDER — ACETAMINOPHEN 325 MG PO TABS
ORAL_TABLET | ORAL | Status: AC
  Filled 2020-04-13: qty 2

## 2020-04-13 MED ORDER — LEVOTHYROXINE SODIUM 25 MCG PO TABS
25.0000 ug | ORAL_TABLET | Freq: Every day | ORAL | Status: DC
  Administered 2020-04-14 – 2020-04-19 (×5): 25 ug via ORAL
  Filled 2020-04-13 (×5): qty 1

## 2020-04-13 MED ORDER — BENZTROPINE MESYLATE 1 MG PO TABS
1.0000 mg | ORAL_TABLET | Freq: Two times a day (BID) | ORAL | Status: DC
  Administered 2020-04-13 – 2020-04-17 (×8): 1 mg via ORAL
  Filled 2020-04-13 (×11): qty 1

## 2020-04-13 MED ORDER — CHLORHEXIDINE GLUCONATE CLOTH 2 % EX PADS: 6.0000 | MEDICATED_PAD | Freq: Every day | CUTANEOUS | Status: DC

## 2020-04-13 MED ORDER — PROCHLORPERAZINE MALEATE 5 MG PO TABS
5.0000 mg | ORAL_TABLET | Freq: Four times a day (QID) | ORAL | Status: DC | PRN
  Filled 2020-04-13: qty 1

## 2020-04-13 MED ORDER — ASCORBIC ACID 500 MG PO TABS
250.0000 mg | ORAL_TABLET | Freq: Two times a day (BID) | ORAL | Status: DC
  Administered 2020-04-13 – 2020-04-19 (×11): 250 mg via ORAL
  Filled 2020-04-13 (×12): qty 1

## 2020-04-13 MED ORDER — LIDOCAINE HCL 1 % IJ SOLN
INTRAMUSCULAR | Status: AC
Start: 2020-04-13 — End: 2020-04-14
  Filled 2020-04-13: qty 20

## 2020-04-13 MED ORDER — ACETAMINOPHEN 325 MG PO TABS: 650.0000 mg | ORAL_TABLET | Freq: Four times a day (QID) | ORAL | Status: DC | PRN

## 2020-04-13 NOTE — ED Notes (Signed)
Patient transported to xray at this time; xray will bring patient to room.

## 2020-04-13 NOTE — Procedures (Signed)
Patient seen on Hemodialysis. BP (!) 126/57   Pulse 85   Temp 97.8 F (36.6 C) (Oral)   Resp 18   SpO2 98%   QB 400, UF goal 4L Tolerating treatment without complaints at this time.   [name] Shiley MD [facility]. Office # [phone] Pager # [phone] 7:05 PM

## 2020-04-13 NOTE — Progress Notes (Signed)
VAST consulted to obtain IV access and labwork. Pt with restricted left arm d/t AV fistula and lower right arm in cast d/t broken wrist. Assessed upper right arm utilizing [place]. Only vessel visualized and appropriate for USGIV used to place 1.88 inch 20g PIV. Advised ER nurse.

## 2020-04-13 NOTE — Progress Notes (Signed)
IV team attempting IV access. Pt with full abdomen and wheezing while breathing. Sat's initially in low 90's.  Once IV placed, pt with sats in upper 80's and increased work of breathing. Charge nurse notified.

## 2020-04-13 NOTE — Telephone Encounter (Signed)
[name] [name] needs to omit a therapy visit this week.  Please call her at [phone].

## 2020-04-13 NOTE — ED Notes (Signed)
RN attempted IV- unsuccessful. IV team now at bedside.

## 2020-04-13 NOTE — ED Notes (Signed)
Taken to IR

## 2020-04-13 NOTE — Progress Notes (Signed)
Patient transported to ER 6 liters of Oxygen.  Able to answer questions.  Accompanied by family member.  Sander Nephew, RN 04/13/2020 9:38 AM.

## 2020-04-13 NOTE — Consult Note (Addendum)
[facility] Renal Consultation Note    Indication for Consultation:  Management of ESRD/hemodialysis, anemia, hypertension/volume, and secondary hyperparathyroidism.  HPI: [name] [name] is a 54 y.o. male with a PMH including ESRD on dialysis, prolonged hospitalization 11/2019-01/2020 s/p cardiac arrest, weaned from trach, R wrist fracture, HTN, CHF, prolonged QT, PE, and a. Fib on eliquis who presented to the ED for SOB. Patient is on O2 2L at baseline. PT saw the patient today and noted worsening SOB and increases his O2 to 2.5L. On presentation, O2 sat 96% on 2L however patient developed increased work of breathing requiring high flow nasal cannula. BiPAP was recommended however patient refused as he reportedly could not tolerate it. Initial labs showed K+ 4.3, Na 140, BUN 36, Cr 5.81, CO2 31, Ca 8.7, Hgb 12.7, WBC 11.1. COVID 19 test negative. CXR with bilateral moderate pleural effusions, cardiomegaly, and pulmonary vascular congestion. Review of outpatient records reveals patient has not been able to meet EDW due to hypotension during dialysis. Extra dialysis sessions were recommended for the past two weeks but it does not appear patient attended any extra HD.  Patient seen in the ED with critical care NP at bedside. Discussed that he is volume overloaded and needs extra HD and possible thoracentesis for fluid management. However, he became agitated as he did not want to stay to wait for dialysis. Verbalized that he does not like dialysis and was not happy about needing an extra dialysis session today. He stated he was going to leave and come back in 1 hour. Explained that we are not able to hold a dialysis seat for him but we strongly recommend he stay and receive care. Explained risks of leaving AMA including possibly death. Patient declined physical exam and began taking off EKG leads. Fortunately, ED contacted me shortly after and reported he agreed to stay for dialysis. Will plan for  HD as soon as possible.   Past Medical History:  Diagnosis Date  . Abdominal distension 08/10/2018  . Acute on chronic respiratory failure with hypoxia ([place])   . Acute on chronic systolic and diastolic heart failure, NYHA class 4 ([place])   . Acute pulmonary embolism without acute cor pulmonale (HCC)   . Alcohol abuse 11/26/2010   Qualifier: Diagnosis of  By: [name] MD, Pershing Proud  . Anemia due to blood loss, acute 07/15/2012  . CHF (congestive heart failure) ([place])   . Cholecystitis 04/18/2014  . Chronic atrial fibrillation ([place])   . Cigarette smoker    has currently quit  . Cocaine abuse ([place]) 11/26/2010   Qualifier: Diagnosis of  By: [name] MD, Benjamine Mola  has been over a year  . 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 Echo 8/13: Severe LVH, EF 40-45%, inferoposterior akinesis, grade 2 diastolic dysfunction, moderate LAE, mild RVE, mildly reduced RVSF, mild RAE; cannot rule out R atrial mass-suggest TEE or cardiac MRI  //  B. Echo 3/17: Mild LVH, EF 25-30%, diffuse HK, grade 2 diastolic dysfunction, mild MR, severe LAE,  moderately reduced RVSF, severe RAE, mild TR, moderate PI, PASP 55 mmHg    . Dyspnea   . End stage renal disease on dialysis ([place])   . ESRD (end stage renal disease) on dialysis [facility])    MWF- [place] (05/18/2017)  . Hemodialysis patient ([place])    M,W,F  . Hernia, inguinal, right 08/05/2016  . Hypertension   . 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 Hypocalcemia 07/15/2012  . Medical non-compliance   . Non-ischemic cardiomyopathy ([place])    A. R/L HC 3/17: Normal coronary arteries, moderate pulmonary hypertension (PASP 65 mmHg), elevated LV filling pressures (LVEDP 45 mmHg)   . NSVT (nonsustained ventricular tachycardia) ([place]) 02/22/2016  . NSVT (nonsustained ventricular tachycardia) ([place]) 02/22/2016  .  Pneumonia 11/12/2017  . Polysubstance abuse ([place])   . Prolonged Q-T interval on ECG 05/18/2017  . Prolonged QT interval 05/18/2017  . Restless leg syndrome   . Solitary pulmonary nodule 10/10/2015   See cxr 10/09/2015 - CT rec 10/10/2015 >>>   . Upper airway cough syndrome 10/09/2015   Off ACEi around 1st Oct 2016  - Sinus CT 10/10/2015 >>>     Past Surgical History:  Procedure Laterality Date  . [employer] TRANSPOSITION  01/19/2013   Procedure: BASCILIC VEIN TRANSPOSITION;  Surgeon: Conrad Camp Hill, MD;  Location: [place];  Service: Vascular;  Laterality: Left;  left 2nd stage basilic vein transposition  . CARDIAC CATHETERIZATION N/A 02/25/2016   Procedure: Right/Left Heart Cath and Coronary Angiography;  Surgeon: Burnell Blanks, MD;  Location: [place] CV LAB;  Service: Cardiovascular;  Laterality: N/A;  . INGUINAL HERNIA REPAIR Right 08/05/2016   Procedure: RIGHT INGUINAL HERNIA REPAIR WITH MESH;  Surgeon: Donnie Mesa, MD;  Location: [place];  Service: General;  Laterality: Right;  . INSERTION OF DIALYSIS CATHETER  07/17/2012   Procedure: INSERTION OF DIALYSIS CATHETER;  Surgeon: Conrad El Camino Angosto, MD;  Location: [place];  Service: Vascular;  Laterality: Right;  right internal jugular  . INSERTION OF MESH Right 08/05/2016   Procedure: INSERTION OF MESH;  Surgeon: Donnie Mesa, MD;  Location: [place];  Service: General;  Laterality: Right;  . IR GASTROSTOMY TUBE MOD SED  01/09/2020  . IR GASTROSTOMY TUBE REMOVAL  03/28/2020  . IR PATIENT EVAL TECH 0-60 MINS  03/20/2020  . IR THORACENTESIS ASP PLEURAL SPACE W/IMG GUIDE  01/12/2020  . IR THORACENTESIS ASP PLEURAL SPACE W/IMG GUIDE  02/21/2020  . REVISION OF ARTERIOVENOUS GORETEX GRAFT Left 09/07/2682   Procedure: PLICATION OF ARTERIOVENOUS FISTULA LEFT ARM;  Surgeon: Elam Dutch, MD;  Location: [place];  Service: Vascular;  Laterality: Left;  . REVISON OF ARTERIOVENOUS FISTULA Left 01/06/2017   Procedure: REVISON OF ARTERIOVENOUS FISTULA;  Surgeon:  Conrad [place]ville, MD;  Location: [place];  Service: Vascular;  Laterality: Left;  . REVISON OF ARTERIOVENOUS FISTULA Left 10/27/2018   Procedure: REVISION PLICATION OF ARTERIOVENOUS FISTULA  LEFT UPPER ARM;  Surgeon: Waynetta Sandy, MD;  Location: [place];  Service: Vascular;  Laterality: Left;  . TRACHEOSTOMY TUBE PLACEMENT N/A 11/24/2019   Procedure: TRACHEOSTOMY;  Surgeon: Izora Gala, MD;  Location: [place];  Service: ENT;  Laterality: N/A;  . UMBILICAL HERNIA REPAIR N/A 08/05/2016   Procedure: [place];  Surgeon: Donnie Mesa, MD;  Location: [place];  Service: General;  Laterality: N/A;  . VENOGRAM N/A 08/09/2012   Procedure: VENOGRAM;  Surgeon: Conrad La Vergne, MD;  Location: [facility] CATH LAB;  Service: Cardiovascular;  Laterality: N/A;   Family History  Problem Relation Age of Onset  . Hypertension Mother   . Diabetes Mother   . Renal Disease Mother   . Hypertension Father    Social History:  reports that he quit smoking about 17 months ago. His smoking use included cigarettes. He quit after 30.00 years of use.  He has never used smokeless tobacco. He reports current alcohol use of about 4.0 standard drinks of alcohol per week. He reports that he does not use drugs.  ROS: As per HPI otherwise negative.    Physical Exam: Vitals:   04/13/20 1215 04/13/20 1230 04/13/20 1245 04/13/20 1300  BP: (!) 148/133 (!) 138/115 (!) 154/129 (!) 172/109  Pulse:  (!) 30 70   Resp:      Temp:      TempSrc:      SpO2:  95% 94%       Patient declined full physical exam by this provider.  General: Well developed, chronically ill appearing male seated on bedside Head: Normocephalic, atraumatic, Lungs: On O2 10L, able to speak short sentences, respirations somewhat labored  Abdomen: Appears distended Lower extremities: Legs appear swollen, pitting edema per ED notes  Neuro: Alert. Moves all extremities spontaneously. Psych:  Responds to questions appropriately with a normal  affect. Dialysis Access: LUE AVF  Allergies  Allergen Reactions  . Losartan Cough, Anaphylaxis and Other (See Comments)   Prior to Admission medications   Medication Sig Start Date End Date Taking? Authorizing Provider  acetaminophen (TYLENOL) 325 MG tablet Take 1-2 tablets (325-650 mg total) by mouth every 4 (four) hours as needed for mild pain. 02/27/20   Love, Ivan Anchors, PA-C  amiodarone (PACERONE) 200 MG tablet Take 1 tablet (200 mg total) by mouth daily. 02/27/20   Love, Ivan Anchors, PA-C  amitriptyline (ELAVIL) 10 MG tablet Take 1 tablet (10 mg total) by mouth at bedtime. 04/06/20   Raulkar, Clide Deutscher, MD  apixaban (ELIQUIS) 2.5 MG TABS tablet Take 1 tablet (2.5 mg total) by mouth 2 (two) times daily. 02/27/20   Love, Ivan Anchors, PA-C  benztropine (COGENTIN) 1 MG tablet Take 1 tablet (1 mg total) by mouth 2 (two) times daily. 02/27/20   Love, Ivan Anchors, PA-C  Calcium Acetate 668 (169 Ca) MG TABS Take by mouth. 03/13/20   [provider]  colchicine 0.6 MG tablet Take 1 tablet (0.6 mg total) by mouth daily. 02/28/20   Love, Ivan Anchors, PA-C  dextromethorphan-guaiFENesin (MUCINEX DM) 30-600 MG 12hr tablet Take 1 tablet by mouth 2 (two) times daily. 02/27/20   Love, Ivan Anchors, PA-C  docusate sodium (COLACE) 100 MG capsule Take 1 capsule (100 mg total) by mouth daily. 04/06/20 04/06/21  Izora Ribas, MD  folic acid (FOLVITE) 1 MG tablet Take 1 tablet (1 mg total) by mouth daily. 02/28/20   Love, Ivan Anchors, PA-C  levothyroxine (SYNTHROID) 25 MCG tablet Take 1 tablet (25 mcg total) by mouth daily at 6 (six) AM. 02/28/20   Love, Ivan Anchors, PA-C  Melatonin 3 MG TABS Take 1 tablet (3 mg total) by mouth at bedtime. To help with sleep 02/27/20   Love, Ivan Anchors, PA-C  midodrine (PROAMATINE) 10 MG tablet Take 1 tablet (10 mg total) by mouth 3 (three) times daily with meals. 02/27/20   Love, Ivan Anchors, PA-C  multivitamin (RENA-VIT) TABS tablet Take 1 tablet by mouth at bedtime. 04/06/20   Raulkar, Clide Deutscher, MD   oxyCODONE-acetaminophen (PERCOCET) 7.5-325 MG tablet Take 1 tablet by mouth every 4 (four) hours as needed for severe pain. 04/06/20   Raulkar, Clide Deutscher, MD  predniSONE (DELTASONE) 10 MG tablet Take 1 tablet (10 mg total) by mouth daily with breakfast. 02/28/20   Love, Ivan Anchors, PA-C  pregabalin (LYRICA) 50 MG capsule Take 1 capsule (50 mg total) by mouth at bedtime. 03/07/20  Izora Ribas, MD  prochlorperazine (COMPAZINE) 5 MG tablet Take 1 tablet (5 mg total) by mouth every 6 (six) hours as needed for nausea or vomiting. 02/27/20   Love, Ivan Anchors, PA-C  senna-docusate (SENOKOT-S) 8.6-50 MG tablet Take 1 tablet by mouth at bedtime. 04/06/20   Raulkar, Clide Deutscher, MD  thiamine 100 MG tablet Take 1 tablet (100 mg total) by mouth daily. 02/28/20   Love, Ivan Anchors, PA-C  traMADol (ULTRAM) 50 MG tablet Take 1 tablet (50 mg total) by mouth every 6 (six) hours as needed for moderate pain or severe pain. 03/07/20   Raulkar, Clide Deutscher, MD  vitamin C (ASCORBIC ACID) 250 MG tablet Take 1 tablet (250 mg total) by mouth 2 (two) times daily. 02/27/20   Bary Leriche, PA-C   Current Facility-Administered Medications  Medication Dose Route Frequency Provider Last Rate Last Admin  . [START ON 04/14/2020] Chlorhexidine Gluconate Cloth 2 % PADS 6 each  6 each Topical Q0600 Shambria Camerer G, PA-C      . sodium chloride flush (NS) 0.9 % injection 3 mL  3 mL Intravenous Once Trifan, Carola Rhine, MD       Current Outpatient Medications  Medication Sig Dispense Refill  . acetaminophen (TYLENOL) 325 MG tablet Take 1-2 tablets (325-650 mg total) by mouth every 4 (four) hours as needed for mild pain.    Marland Kitchen amiodarone (PACERONE) 200 MG tablet Take 1 tablet (200 mg total) by mouth daily. 30 tablet 1  . amitriptyline (ELAVIL) 10 MG tablet Take 1 tablet (10 mg total) by mouth at bedtime. 30 tablet 3  . apixaban (ELIQUIS) 2.5 MG TABS tablet Take 1 tablet (2.5 mg total) by mouth 2 (two) times daily. 60 tablet 1  . benztropine  (COGENTIN) 1 MG tablet Take 1 tablet (1 mg total) by mouth 2 (two) times daily. 60 tablet 1  . Calcium Acetate 668 (169 Ca) MG TABS Take by mouth.    . colchicine 0.6 MG tablet Take 1 tablet (0.6 mg total) by mouth daily. 30 tablet 1  . dextromethorphan-guaiFENesin (MUCINEX DM) 30-600 MG 12hr tablet Take 1 tablet by mouth 2 (two) times daily. 14 tablet 0  . docusate sodium (COLACE) 100 MG capsule Take 1 capsule (100 mg total) by mouth daily. 30 capsule 2  . folic acid (FOLVITE) 1 MG tablet Take 1 tablet (1 mg total) by mouth daily. 30 tablet 1  . levothyroxine (SYNTHROID) 25 MCG tablet Take 1 tablet (25 mcg total) by mouth daily at 6 (six) AM. 30 tablet 1  . Melatonin 3 MG TABS Take 1 tablet (3 mg total) by mouth at bedtime. To help with sleep  0  . midodrine (PROAMATINE) 10 MG tablet Take 1 tablet (10 mg total) by mouth 3 (three) times daily with meals. 90 tablet 1  . multivitamin (RENA-VIT) TABS tablet Take 1 tablet by mouth at bedtime. 30 tablet 0  . oxyCODONE-acetaminophen (PERCOCET) 7.5-325 MG tablet Take 1 tablet by mouth every 4 (four) hours as needed for severe pain. 30 tablet 0  . predniSONE (DELTASONE) 10 MG tablet Take 1 tablet (10 mg total) by mouth daily with breakfast. 30 tablet 1  . pregabalin (LYRICA) 50 MG capsule Take 1 capsule (50 mg total) by mouth at bedtime. 30 capsule 1  . prochlorperazine (COMPAZINE) 5 MG tablet Take 1 tablet (5 mg total) by mouth every 6 (six) hours as needed for nausea or vomiting. 30 tablet 0  . senna-docusate (SENOKOT-S) 8.6-50 MG tablet  Take 1 tablet by mouth at bedtime. 30 tablet 0  . thiamine 100 MG tablet Take 1 tablet (100 mg total) by mouth daily. 30 tablet 1  . traMADol (ULTRAM) 50 MG tablet Take 1 tablet (50 mg total) by mouth every 6 (six) hours as needed for moderate pain or severe pain. 120 tablet 0  . vitamin C (ASCORBIC ACID) 250 MG tablet Take 1 tablet (250 mg total) by mouth 2 (two) times daily.     Labs: Basic Metabolic Panel: Recent  Labs  Lab 04/13/20 1044 04/13/20 1225  NA 140 137  K 4.3 4.3  CL 93*  --   CO2 31  --   GLUCOSE 118*  --   BUN 36*  --   CREATININE 5.81*  --   CALCIUM 8.7*  --    Liver Function Tests: Recent Labs  Lab 04/13/20 1044  AST 16  ALT 19  ALKPHOS 124  BILITOT 0.8  PROT 6.4*  ALBUMIN 3.2*   CBC: Recent Labs  Lab 04/13/20 1044 04/13/20 1225  WBC 11.1*  --   NEUTROABS 8.8*  --   HGB 12.7* 16.3  HCT 44.9 48.0  MCV 102.7*  --   PLT 221  --    Studies/Results: DG Chest 2 View  Result Date: 04/13/2020 CLINICAL DATA:  Shortness of breath EXAM: CHEST - 2 VIEW COMPARISON:  February 24, 2020 FINDINGS: There are sizable pleural effusions bilaterally. There is atelectatic change in the lung bases. There is cardiomegaly with pulmonary vascular congestion. There is aortic atherosclerosis. No bone lesions. No adenopathy evident. IMPRESSION: Cardiomegaly with pulmonary vascular congestion. Sizable pleural effusions bilaterally. Suspect a degree of congestive heart failure. Bibasilar atelectasis. Aortic Atherosclerosis (ICD10-I70.0). Electronically Signed   By: Lowella Grip III M.D.   On: 04/13/2020 10:11    Dialysis Orders:  Center: [facility]  on TTS. 180NRe Time: 4 hours 15 min, BFR 450, DFR Auto 1.5, 2L/2Ca, Prof 4, AVF 15g Heparin 5000 unit bolus Calcitriol 0.38mcg PO q HD Calcium acetate 3 tab TID   Assessment/Plan: 1.  Shortness of breath: secondary to pulmonary edema and bilateral pleural effusions. Patient is edematous appearing and 10kg over his outpatient EDW. Full volume removal has not been achieved outpatient due to hypotension, however BP is elevated today. Will plan for urgent HD with 5L UF as tolerated. May need thoracentesis as well. Will discuss fluid limitations once patient is stabilized. 2.  ESRD:  TTS schedule, extra HD today as above then resume TTS schedule. Will not use heparin in case thoracentesis is needed.  3.  Hypertension/volume: BP  elevated, volume overloaded. Plan for serial HD today and tomorrow for volume removal as tolerated. 4.  Anemia: Hgb 16.3. No ESA indicated.  5.  Metabolic bone disease: Calcium at goal. Continue calcitriol and calcium acetate,  6. Nutrition:  Alb 3.2. Patient feels he is gaining body weight. Will reassess EDW once respiratory status improved.   Addendum: Pt had thoracentesis before HD. Reports breathing is improved. Weight stable at 71.7kg, BP 140s/70s at start of treatment. UF goal adjusted to 3-4L as tolerated.  Anice Paganini, PA-C 04/13/2020, 1:45 PM  [facility] Pager: [phone]

## 2020-04-13 NOTE — Telephone Encounter (Signed)
[name] [name], ST/[facility] called to omit 1 visit for this week due to patient having doctor. Will discharge after 2 visits next week.

## 2020-04-13 NOTE — Consult Note (Incomplete Revision)
[facility] ?Renal Consultation Note  ?  ?Indication for Consultation:  Management of ESRD/hemodialysis, anemia, hypertension/volume, and secondary hyperparathyroidism. ? ?HPI: [name] [name] is a 54 y.o. male with a PMH including ESRD on dialysis, prolonged hospitalization 11/2019-01/2020 s/p cardiac arrest, weaned from trach, R wrist fracture, HTN, CHF, prolonged QT, PE, and a. Fib on eliquis who presented to the ED for SOB. Patient is on O2 2L at baseline. PT saw the patient today and noted worsening SOB and increases his O2 to 2.5L. On presentation, O2 sat 96% on 2L however patient developed increased work of breathing requiring high flow nasal cannula. BiPAP was recommended however patient refused as he reportedly could not tolerate it. Initial labs showed K+ 4.3, Na 140, BUN 36, Cr 5.81, CO2 31, Ca 8.7, Hgb 12.7, WBC 11.1. COVID 19 test negative. CXR with bilateral moderate pleural effusions, cardiomegaly, and pulmonary vascular congestion. Review of outpatient records reveals patient has not been able to meet EDW due to hypotension during dialysis. Extra dialysis sessions were recommended for the past two weeks but it does not appear patient attended any extra HD. ? ? Patient seen in the ED with critical care NP at bedside. Discussed that he is volume overloaded and needs extra HD and possible thoracentesis for fluid management. However, he became agitated as he did not want to stay to wait for dialysis. Verbalized that he does not like dialysis and was not happy about needing an extra dialysis session today. He stated he was going to leave and come back in 1 hour. Explained that we are not able to hold a dialysis seat for him but we strongly recommend he stay and receive care. Explained risks of leaving AMA including possibly death. Patient declined physical exam and began taking off EKG leads. Fortunately, ED contacted me shortly after and reported he agreed to stay for dialysis. Will plan for HD as soon as possible.  ? ?Past Medical History:

## 2020-04-13 NOTE — Progress Notes (Signed)
Went to evaluate patient he was hypoxemic and had difficulty staying awake, on chart review large pleural effusions on recent imaging.  He was taking slow shallow breaths. Sent pt to ED immediately for further management.

## 2020-04-13 NOTE — Progress Notes (Signed)
Patient arrived to [facility] from dialysis via transport. Patient is alert and oriented. Refusing staff from doing full head to toe assessment. States he has a wound on both feet but he will not let RN assess. He states wound is really painful and does not want anyone touching them at the moment. Multiple scabs noted on both anterior  Legs and knee. Fistula on left upper arm present thrill and bruit. MD notified of arrival to unit and refusing of care. Currently on 4L [place] sating 100%. Will continue to monitor patient.

## 2020-04-13 NOTE — ED Provider Notes (Signed)
[facility] EMERGENCY DEPARTMENT Provider Note   CSN: [id number] Arrival date & time: 04/13/20  2248     History Chief Complaint  Patient presents with  . Shortness of Breath    [name] [name] is a 54 y.o. male presenting for evaluation of SOB.   Pt states for the past 2 wks, he has been more SOB than normal. This is present with exertion and when laying flat. He is on 2 L via [place] at baseline. His PT has notices hypoxia during recent session. He denies fever, chills, cough, CP, n/v, abd pain. He is ESRD on dialysis, goes tues/thu/Sa. Last session was on Thursday (yesterday) normal amount of fluid was taken. He does not have leg pain, dad states his legs are slightly more swollen than normal.   Additional history obtained from chart review. Pt had a cardiac arrest 11/16/2019, was admitted multiple times through Feb 20201. He was on a trach, but weaned off and is now on O2 via [place].  He recently fell and broke his R wrist. He has a h/o HTN, polysubstance abuse, CHF, prolonged QT, PE, noncompliance, ESRD on dialysis, alcohol abuse, a fib on eliquis.   HPI     Past Medical History:  Diagnosis Date  . Abdominal distension 08/10/2018  . Acute on chronic respiratory failure with hypoxia ([place])   . Acute on chronic systolic and diastolic heart failure, NYHA class 4 ([place])   . Acute pulmonary embolism without acute cor pulmonale (HCC)   . Alcohol abuse 11/26/2010   Qualifier: Diagnosis of  By: [name] MD, Pershing Proud  . Anemia due to blood loss, acute 07/15/2012  . CHF (congestive heart failure) ([place])   . Cholecystitis 04/18/2014  . Chronic atrial fibrillation ([place])   . Cigarette smoker    has currently quit  . Cocaine abuse ([place]) 11/26/2010   Qualifier: Diagnosis of  By: [name] MD, Benjamine Mola  has been over a year  . 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 Echo 8/13: Severe LVH, EF 40-45%, inferoposterior akinesis, grade 2  diastolic dysfunction, moderate LAE, mild RVE, mildly reduced RVSF, mild RAE; cannot rule out R atrial mass-suggest TEE or cardiac MRI  //  B. Echo 3/17: Mild LVH, EF 25-30%, diffuse HK, grade 2 diastolic dysfunction, mild MR, severe LAE,  moderately reduced RVSF, severe RAE, mild TR, moderate PI, PASP 55 mmHg    . Dyspnea   . End stage renal disease on dialysis ([place])   . ESRD (end stage renal disease) on dialysis [facility])    MWF- [place] (05/18/2017)  . Hemodialysis patient ([place])    M,W,F  . Hernia, inguinal, right 08/05/2016  . Hypertension   . 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 Hypocalcemia 07/15/2012  . Medical non-compliance   . Non-ischemic cardiomyopathy ([place])    A. R/L HC 3/17: Normal coronary arteries, moderate pulmonary hypertension (PASP 65 mmHg), elevated LV filling pressures (LVEDP 45 mmHg)   . NSVT (nonsustained ventricular tachycardia) ([place]) 02/22/2016  . NSVT (nonsustained ventricular tachycardia) ([place]) 02/22/2016  . Pneumonia 11/12/2017  . Polysubstance abuse ([place])   . Prolonged Q-T interval on ECG 05/18/2017  . Prolonged QT interval 05/18/2017  . Restless leg syndrome   . Solitary pulmonary nodule 10/10/2015   See cxr 10/09/2015 - CT rec 10/10/2015 >>>   . Upper airway cough syndrome 10/09/2015  Off ACEi around 1st Oct 2016  - Sinus CT 10/10/2015 >>>      Patient Active Problem List   Diagnosis Date Noted  . Pleural effusion 02/28/2020  . Supplemental oxygen dependent 02/28/2020  . Sleep disturbance   . Generalized anxiety disorder   . Dysphagia   . Encephalopathy   . Debility   . ESRD on dialysis ([place])   . Dry gangrene (HCC)--multiple toes   . Hypotension   . S/P percutaneous endoscopic gastrostomy (PEG) tube placement ([place])   . Pericardial effusion   . Atrial fibrillation ([place])   . Acute on chronic respiratory failure with hypoxia ([place])    . End stage renal disease on dialysis ([place])   . Acute on chronic systolic and diastolic heart failure, NYHA class 4 ([place])   . Chronic atrial fibrillation ([place])   . Acute pulmonary embolism without acute cor pulmonale (HCC)   . Tracheostomy status ([place])   . Adult failure to thrive   . Ventilator dependent ([place])   . Chronic respiratory failure with hypoxia ([place])   . Anoxic encephalopathy ([place])   . Tracheostomy in place [facility])   . Endotracheally intubated   . Palliative care by specialist   . Pressure injury of skin 11/19/2019  . Acute respiratory failure ([place])   . ESRD on hemodialysis ([place])   . Cardiac arrest ([place]) 11/16/2019  . Paroxysmal atrial fibrillation (HCC)   . Superficial venous thrombosis of right arm 11/05/2018  . Nonischemic cardiomyopathy ([place]) 11/03/2018  . Essential hypertension 11/03/2018  . ESRD needing dialysis ([place]) 10/26/2018  . Prolonged Q-T interval on ECG 05/18/2017  . Hernia, inguinal, right 08/05/2016  . Acute on chronic systolic CHF (congestive heart failure) ([place])   . Solitary pulmonary nodule 10/10/2015  . Chronic combined systolic and diastolic heart failure ([place]) 04/18/2014  . Anemia due to blood loss, acute 07/15/2012  . Hypocalcemia 07/15/2012  . Cigarette smoker 11/26/2010    Past Surgical History:  Procedure Laterality Date  . [place] TRANSPOSITION  01/19/2013   Procedure: BASCILIC VEIN TRANSPOSITION;  Surgeon: Conrad [place], MD;  Location: [place];  Service: Vascular;  Laterality: Left;  left 2nd stage basilic vein transposition  . CARDIAC CATHETERIZATION N/A 02/25/2016   Procedure: Right/Left Heart Cath and Coronary Angiography;  Surgeon: Burnell Blanks, MD;  Location: [facility] CV LAB;  Service: Cardiovascular;  Laterality: N/A;  . INGUINAL HERNIA REPAIR Right 08/05/2016   Procedure: RIGHT INGUINAL HERNIA REPAIR WITH MESH;  Surgeon: Donnie Mesa, MD;  Location: [place];  Service: General;  Laterality: Right;  . INSERTION OF DIALYSIS  CATHETER  07/17/2012   Procedure: INSERTION OF DIALYSIS CATHETER;  Surgeon: Conrad [place], MD;  Location: [facility];  Service: Vascular;  Laterality: Right;  right internal jugular  . INSERTION OF MESH Right 08/05/2016   Procedure: INSERTION OF MESH;  Surgeon: Donnie Mesa, MD;  Location: [place];  Service: General;  Laterality: Right;  . IR GASTROSTOMY TUBE MOD SED  01/09/2020  . IR GASTROSTOMY TUBE REMOVAL  03/28/2020  . IR PATIENT EVAL TECH 0-60 MINS  03/20/2020  . IR THORACENTESIS ASP PLEURAL SPACE W/IMG GUIDE  01/12/2020  . IR THORACENTESIS ASP PLEURAL SPACE W/IMG GUIDE  02/21/2020  . REVISION OF ARTERIOVENOUS GORETEX GRAFT Left 7/51/0258   Procedure: PLICATION OF ARTERIOVENOUS FISTULA LEFT ARM;  Surgeon: Elam Dutch, MD;  Location: [place];  Service: Vascular;  Laterality: Left;  . REVISON OF ARTERIOVENOUS FISTULA Left 01/06/2017   Procedure: REVISON OF  ARTERIOVENOUS FISTULA;  Surgeon: Conrad [place], MD;  Location: [place];  Service: Vascular;  Laterality: Left;  . REVISON OF ARTERIOVENOUS FISTULA Left 10/27/2018   Procedure: REVISION PLICATION OF ARTERIOVENOUS FISTULA  LEFT UPPER ARM;  Surgeon: Waynetta Sandy, MD;  Location: [facility];  Service: Vascular;  Laterality: Left;  . TRACHEOSTOMY TUBE PLACEMENT N/A 11/24/2019   Procedure: TRACHEOSTOMY;  Surgeon: Izora Gala, MD;  Location: [place];  Service: ENT;  Laterality: N/A;  . UMBILICAL HERNIA REPAIR N/A 08/05/2016   Procedure: [place];  Surgeon: Donnie Mesa, MD;  Location: [place];  Service: General;  Laterality: N/A;  . VENOGRAM N/A 08/09/2012   Procedure: VENOGRAM;  Surgeon: Conrad Viborg, MD;  Location: [facility] CATH LAB;  Service: Cardiovascular;  Laterality: N/A;       Family History  Problem Relation Age of Onset  . Hypertension Mother   . Diabetes Mother   . Renal Disease Mother   . Hypertension Father     Social History   Tobacco Use  . Smoking status: Former Smoker    Years: 30.00    Types: Cigarettes     Quit date: 10/20/2018    Years since quitting: 1.4  . Smokeless tobacco: Never Used  Substance Use Topics  . Alcohol use: Yes    Alcohol/week: 4.0 standard drinks    Types: 4 Cans of beer per week  . Drug use: No    Types: Marijuana    Comment: 05/18/2017 "qd"    Home Medications Prior to Admission medications   Medication Sig Start Date End Date Taking? Authorizing Provider  acetaminophen (TYLENOL) 325 MG tablet Take 1-2 tablets (325-650 mg total) by mouth every 4 (four) hours as needed for mild pain. 02/27/20   Love, Ivan Anchors, PA-C  amiodarone (PACERONE) 200 MG tablet Take 1 tablet (200 mg total) by mouth daily. 02/27/20   Love, Ivan Anchors, PA-C  amitriptyline (ELAVIL) 10 MG tablet Take 1 tablet (10 mg total) by mouth at bedtime. 04/06/20   Raulkar, Clide Deutscher, MD  apixaban (ELIQUIS) 2.5 MG TABS tablet Take 1 tablet (2.5 mg total) by mouth 2 (two) times daily. 02/27/20   Love, Ivan Anchors, PA-C  benztropine (COGENTIN) 1 MG tablet Take 1 tablet (1 mg total) by mouth 2 (two) times daily. 02/27/20   Love, Ivan Anchors, PA-C  Calcium Acetate 668 (169 Ca) MG TABS Take by mouth. 03/13/20   [provider]  colchicine 0.6 MG tablet Take 1 tablet (0.6 mg total) by mouth daily. 02/28/20   Love, Ivan Anchors, PA-C  dextromethorphan-guaiFENesin (MUCINEX DM) 30-600 MG 12hr tablet Take 1 tablet by mouth 2 (two) times daily. 02/27/20   Love, Ivan Anchors, PA-C  docusate sodium (COLACE) 100 MG capsule Take 1 capsule (100 mg total) by mouth daily. 04/06/20 04/06/21  Izora Ribas, MD  folic acid (FOLVITE) 1 MG tablet Take 1 tablet (1 mg total) by mouth daily. 02/28/20   Love, Ivan Anchors, PA-C  levothyroxine (SYNTHROID) 25 MCG tablet Take 1 tablet (25 mcg total) by mouth daily at 6 (six) AM. 02/28/20   Love, Ivan Anchors, PA-C  Melatonin 3 MG TABS Take 1 tablet (3 mg total) by mouth at bedtime. To help with sleep 02/27/20   Love, Ivan Anchors, PA-C  midodrine (PROAMATINE) 10 MG tablet Take 1 tablet (10 mg total) by mouth 3 (three)  times daily with meals. 02/27/20   Love, Ivan Anchors, PA-C  multivitamin (RENA-VIT) TABS tablet Take 1 tablet  by mouth at bedtime. 04/06/20   Raulkar, Clide Deutscher, MD  oxyCODONE-acetaminophen (PERCOCET) 7.5-325 MG tablet Take 1 tablet by mouth every 4 (four) hours as needed for severe pain. 04/06/20   Raulkar, Clide Deutscher, MD  predniSONE (DELTASONE) 10 MG tablet Take 1 tablet (10 mg total) by mouth daily with breakfast. 02/28/20   Love, Ivan Anchors, PA-C  pregabalin (LYRICA) 50 MG capsule Take 1 capsule (50 mg total) by mouth at bedtime. 03/07/20   Raulkar, Clide Deutscher, MD  prochlorperazine (COMPAZINE) 5 MG tablet Take 1 tablet (5 mg total) by mouth every 6 (six) hours as needed for nausea or vomiting. 02/27/20   Love, Ivan Anchors, PA-C  senna-docusate (SENOKOT-S) 8.6-50 MG tablet Take 1 tablet by mouth at bedtime. 04/06/20   Raulkar, Clide Deutscher, MD  thiamine 100 MG tablet Take 1 tablet (100 mg total) by mouth daily. 02/28/20   Love, Ivan Anchors, PA-C  traMADol (ULTRAM) 50 MG tablet Take 1 tablet (50 mg total) by mouth every 6 (six) hours as needed for moderate pain or severe pain. 03/07/20   Raulkar, Clide Deutscher, MD  vitamin C (ASCORBIC ACID) 250 MG tablet Take 1 tablet (250 mg total) by mouth 2 (two) times daily. 02/27/20   Love, Ivan Anchors, PA-C    Allergies    Losartan  Review of Systems   Review of Systems  Respiratory: Positive for shortness of breath.   Hematological: Bruises/bleeds easily.  All other systems reviewed and are negative.   Physical Exam Updated Vital Signs BP (!) 172/109   Pulse 70   Temp (!) 97.5 F (36.4 C) (Oral)   Resp 12   SpO2 94%   Physical Exam Vitals and nursing note reviewed.  Constitutional:      Appearance: He is well-developed. He is ill-appearing.     Comments: Appears chronically ill. Easily winded (when going from [facility] to bed)  HENT:     Head: Normocephalic and atraumatic.  Eyes:     Conjunctiva/sclera: Conjunctivae normal.     Pupils: Pupils are equal, round, and reactive  to light.  Cardiovascular:     Rate and Rhythm: Normal rate and regular rhythm.     Pulses: Normal pulses.  Pulmonary:     Effort: Pulmonary effort is normal. No respiratory distress.     Breath sounds: Rales present. No wheezing.     Comments: Rales in the bases. No respiratory distress. Winded with little movement.  Abdominal:     General: There is no distension.     Palpations: Abdomen is soft. There is no mass.     Tenderness: There is no abdominal tenderness. There is no guarding or rebound.  Musculoskeletal:        General: Normal range of motion.     Cervical back: Normal range of motion and neck supple.     Right lower leg: Edema present.     Left lower leg: Edema present.     Comments: 2+ pitting edema bilaterally. No calf tenderness R forearm in splint  Skin:    General: Skin is warm and dry.     Capillary Refill: Capillary refill takes less than 2 seconds.  Neurological:     Mental Status: He is alert and oriented to person, place, and time.     ED Results / Procedures / Treatments   Labs (all labs ordered are listed, but only abnormal results are displayed) Labs Reviewed  CBC WITH DIFFERENTIAL/PLATELET - Abnormal; Notable for the following components:  Result Value   WBC 11.1 (*)    Hemoglobin 12.7 (*)    MCV 102.7 (*)    MCHC 28.3 (*)    RDW 25.9 (*)    nRBC 1.1 (*)    Neutro Abs 8.8 (*)    Monocytes Absolute 1.1 (*)    nRBC 2 (*)    All other components within normal limits  COMPREHENSIVE METABOLIC PANEL - Abnormal; Notable for the following components:   Chloride 93 (*)    Glucose, Bld 118 (*)    BUN 36 (*)    Creatinine, Ser 5.81 (*)    Calcium 8.7 (*)    Total Protein 6.4 (*)    Albumin 3.2 (*)    GFR calc non Af Amer 10 (*)    GFR calc Af Amer 12 (*)    Anion gap 16 (*)    All other components within normal limits  POCT I-STAT EG7 - Abnormal; Notable for the following components:   pH, Ven 7.223 (*)    pCO2, Ven 79.0 (*)    pO2, Ven  153.0 (*)    Bicarbonate 32.6 (*)    TCO2 35 (*)    Calcium, Ion 1.08 (*)    All other components within normal limits  TROPONIN I (HIGH SENSITIVITY) - Abnormal; Notable for the following components:   Troponin I (High Sensitivity) 230 (*)    All other components within normal limits  TROPONIN I (HIGH SENSITIVITY) - Abnormal; Notable for the following components:   Troponin I (High Sensitivity) 242 (*)    All other components within normal limits  RESPIRATORY PANEL BY RT PCR (FLU A&B, COVID)  I-STAT VENOUS BLOOD GAS, ED    EKG EKG Interpretation  Date/Time:  Friday April 13 2020 09:39:02 EDT Ventricular Rate:  84 PR Interval:  332 QRS Duration: 112 QT Interval:  390 QTC Calculation: 460 R Axis:   176 Text Interpretation: Sinus rhythm with first degree AV block, PVC, no STEMI, no significant change from march 2021 ecg Confirmed by Octaviano Glow [phone]) on 04/13/2020 10:07:53 AM   Radiology DG Chest 2 View  Result Date: 04/13/2020 CLINICAL DATA:  Shortness of breath EXAM: CHEST - 2 VIEW COMPARISON:  February 24, 2020 FINDINGS: There are sizable pleural effusions bilaterally. There is atelectatic change in the lung bases. There is cardiomegaly with pulmonary vascular congestion. There is aortic atherosclerosis. No bone lesions. No adenopathy evident. IMPRESSION: Cardiomegaly with pulmonary vascular congestion. Sizable pleural effusions bilaterally. Suspect a degree of congestive heart failure. Bibasilar atelectasis. Aortic Atherosclerosis (ICD10-I70.0). Electronically Signed   By: Lowella Grip III M.D.   On: 04/13/2020 10:11    Procedures .Critical Care Performed by: Franchot Heidelberg, PA-C Authorized by: Franchot Heidelberg, PA-C   Critical care provider statement:    Critical care time (minutes):  70   Critical care time was exclusive of:  Separately billable procedures and treating other patients and teaching time   Critical care was necessary to treat or prevent imminent  or life-threatening deterioration of the following conditions:  Respiratory failure   Critical care was time spent personally by me on the following activities:  Blood draw for specimens, development of treatment plan with patient or surrogate, discussions with consultants, evaluation of patient's response to treatment, examination of patient, obtaining history from patient or surrogate, ordering and performing treatments and interventions, ordering and review of laboratory studies, ordering and review of radiographic studies, pulse oximetry, re-evaluation of patient's condition and review of old charts   I  assumed direction of critical care for this patient from another provider in my specialty: no   Comments:     Patient presenting for respiratory failure.  Worsening mental status in the ER, attempted to place BiPAP but patient refused.  Patient on high flow nasal cannula.  I have a high concern this patient may decompensate quickly.  Required multiple consult calls and repeat evaluation.   (including critical care time)  Medications Ordered in ED Medications  sodium chloride flush (NS) 0.9 % injection 3 mL (has no administration in time range)  Chlorhexidine Gluconate Cloth 2 % PADS 6 each (has no administration in time range)    ED Course  I have reviewed the triage vital signs and the nursing notes.  Pertinent labs & imaging results that were available during my care of the patient were reviewed by me and considered in my medical decision making (see chart for details).  Clinical Course as of Apr 13 1406  Fri Apr 13, 3153  [id number] 54 year old male with a very complicated unfortunate medical history of recent cardiac arrest, congestive heart failure, end-stage renal disease on dialysis, also on anticoagulation, presenting to emergency department with shortness of breath.  The patient reports progressively worsening shortness of breath for 2 weeks.  He has attended all his dialysis sessions as  scheduled.  He was found to be hypoxic requiring supplemental oxygen.  His x-ray shows bilateral moderate pleural effusions.  I suspect is likely the cause of his shortness of breath.  It is unclear what is causing these pleural effusions.  His labs are still pending.  He is stable on supplemental oxygen.  I suspect he will likely need admission and therapeutic thoracentesis.  The patient tells me he is willing to stay in the hospital.  His father is also present at the bedside.   [MT]  1220 Trop chronically elevated in setting of ESRD   [MT]  1221 Notified by nursing about concern for increased work of breathing.  Patient had appeared more lethargic to them.  We did advise respiratory come down initiate BiPAP.  However the patient adamantly refused BiPAP.  When I went in to see him he was sitting up and able to talk to me in the same short sentences that I initially saw him.  Is very upset his father been asked to leave the room (this was a precaution until his covid test returned given the bipap situation).  However for now we will continue with high flow nasal cannula as the patient is unwilling to wear his BiPAP.  I separately spoke to his father outside expressed my concerns about his son's respiratory status, explaining that bipap is a stablizing measure to prevent a need for intubation later.  We'll proceed with HFNC until his venous gas returns.  Crit care consulted and will come see patient as well     [MT]  1255 CO2 is elevated and the patient does have mild acidosis 7.22, but he is sitting up on the edge of his bed talking to his father and appears to be mentating well for the time being.  He is still not willing to wear bipap, despite being told this can lead to intubation or even death.  However he appears to have a preserved respiratory drive, we'll keep a close eye on him   [MT]  1406 After multiple discussions with different providers including crit care and nephrology, patient agreeable with  staying in the hospital. He'll be admitted to the IM team  for stepdown unit per our recommendations   [MT]    Clinical Course User Index [MT] Trifan, Carola Rhine, MD   MDM Rules/Calculators/A&P                      Patient presenting for evaluation of worsening shortness of breath. On exam, patient is now winded mildly short of breath. Hospitalist patient stable on his home O2. Rales in lower lobes. Pt with concerning recent medical history. He does appear fluid overloaded, despite going to dialysis yesterday. Will obtain labs and chest x-ray. Concern for pneumonia, CHF\fluid overload, ACS. Less likely PE, as patient is on Eliquis. Case discussed with attending, Dr. Langston Masker evaluated the patient.  Chest x-ray viewed interpreted by me, concerning for bilateral pleural effusion and pulmonary congestion. Per radiology, this is increased in previous. Labs show mild leukocytosis of 11, otherwise electrolytes are stable. Trop elevated in the setting of CKD and ESRD, improved from previous. Doubt ACS. Will consult with IR for drainage of pleural effusion.   Discussed with Dr. Kathlene Cote from interventional radiology, who agrees for pt to get an IR thoracentesis. He will be scheduled.   Informed by RN that patient is becoming more lethargic. On reassessment, patient is much more sleepy than when he first arrived. This is concerning in the setting of respiratory failure. Will have patient be started on BiPAP and obtain VBG.  Patient refused BiPAP and became very agitated. Instead, placed on high flow nasal cannula. I am concerned about his ability to remain stable on this level of oxygen, and fear he may decompensate quickly.  VBG shows acidosis. Will consult with critical care and nephrology.   Discussed with Dr. Posey Pronto from nephrology, pt to be evaluated.   Patient evaluated by critical care nurse practitioner. Patient continues to refuse BiPAP, and at first is refusing admission, but eventually agrees.  PCCM will consult, recommends admission to medicine.  Patient was evaluated by nephrology PA, orders for dialysis are placed.  Discussed with Internal Medicine, pt to be admitted.   Final Clinical Impression(s) / ED Diagnoses Final diagnoses:  Pleural effusion  Acute on chronic respiratory failure, unspecified whether with hypoxia or hypercapnia [facility])    Rx / DC Orders ED Discharge Orders    None       Franchot Heidelberg, PA-C 04/13/20 1412    Wyvonnia Dusky, MD 04/13/20 1719

## 2020-04-13 NOTE — ED Notes (Signed)
Patient resting in bed; noted some difficulty to arouse. Sp02 in mid 90s w/ supplemental o2 at 6L via [place]; BP and HR WNL. RR at 15/min with noted increased work of breathing and accessory muscle use. ER provider and RT notified.

## 2020-04-13 NOTE — Procedures (Signed)
PROCEDURE SUMMARY:  Successful US guided right therapeutic thoracentesis. Yielded 400 mL of amber fluid. Pt tolerated procedure well. No immediate complications.  Specimen was not sent for labs. CXR ordered.  EBL < 5 mL  Docia Barrier PA-C 04/13/2020 4:37 PM

## 2020-04-13 NOTE — Progress Notes (Signed)
Internal Medicine Clinic Attending  Case discussed with Dr. Winfrey  at the time of the visit.  We reviewed the resident's history and exam and pertinent patient test results.  I agree with the assessment, diagnosis, and plan of care documented in the resident's note.  

## 2020-04-13 NOTE — ED Triage Notes (Signed)
Pt from home with his father/caregiver who provides most of the history. He states that pt wears 2L O2 at baseline. PT came out today and thought patient seemed more sob so she turned O2 up to 2.5L and advised them to have patient checked out. pts father reports pt has seemed more sob for the past week or two. Was d/ced from hospital about 1 month ago after being on vent/having pneumonia. O2 sats 96% on 2L in triage, respirations labored.

## 2020-04-13 NOTE — ED Provider Notes (Signed)
.  Critical Care Performed by: Wyvonnia Dusky, MD Authorized by: Wyvonnia Dusky, MD   Critical care provider statement:    Critical care time (minutes):  45   Critical care was necessary to treat or prevent imminent or life-threatening deterioration of the following conditions:  Respiratory failure   Critical care was time spent personally by me on the following activities:  Discussions with consultants, evaluation of patient's response to treatment, examination of patient, ordering and performing treatments and interventions, ordering and review of laboratory studies, ordering and review of radiographic studies, pulse oximetry, re-evaluation of patient's condition, obtaining history from patient or surrogate and review of old charts Comments:     In additional to critical care provided by PA provider, I also spent 45 minutes evaluating and re-evaluating patient the bedside for acute respiratory failure, reviewing labs and imaging, and discussing further management and goals of care with both the patient and his father      Wyvonnia Dusky, MD 04/13/20 1721

## 2020-04-13 NOTE — Progress Notes (Signed)
RT note: RT attempted to place patient on BIPAP after explaining what he was going to do and why. Patient  tolerated it less than 30 seconds and said he can't stand that and asked what I was doing. He refused to wear BIPAP. Patient was severely SOB and unable to complete sentences. RT placed patient on 10L HFNC and notified MD and RN.

## 2020-04-13 NOTE — H&P (Addendum)
Date: 04/13/2020               [name] [name] Name:  [name] [name] MRN: [id number]  DOB: 02-Feb-1966 Age / Sex: 54 y.o., male   PCP: Jean Rosenthal, MD              Medical Service: Internal Medicine Teaching Service              Attending Physician: Dr. Bartholomew Crews, MD    First Contact: Nanetta Batty, MS3 Pager: [phone]  Second Contact: Dr. Jeanmarie Hubert Pager: [phone]  Third Contact Dr. Lonia Skinner Pager: [phone]       After Hours (After 5p/  First Contact Pager: [phone]  weekends / holidays): Second Contact Pager: [phone]   Chief Complaint: Shortness of Breath  History of Present Illness:  [name] [name] is a 54 year old male with a past medical history significant for hypertension, congestive heart failure, previous cardiac arrest requiring extended hospitalization (11/2019-01/2020), chronic hypoxic respiratory failure, pulmonary embolism, end-stage renal disease on dialysis, prolonged QT interval, right wrist fracture, hypocalcemia, and atrial fibrillation on Eliquis who presented to the emergency department with shortness of breath. [name] [name]'s father (caregiver) was bedside and contributed to the past medical history.  [name] [name] was seen on Wednesday by physical therapy who recommend that he be seen due to worsening shortness of breath and increases in his home oxygen therapy to 2.5L (2.0L baseline). [name] [name] and his father reports the shortness of breath has worsened over the past two weeks. [name] [name] is difficulty speaking in complete sentence without having to take a breath. [name] [name] reports significant orthopnea, without any chest pain or chest tightness. He says that this is causing sleep disturbances and that he has to sleep upright in his chair ro with his head elevated.   On ROS, [name] [name] denies any fever, lightheadedness, chills. [name] [name] reports that he has noticed peripheral edema in his lower extremities and some abdominal distention. He also states that he has a  chronic cough that produces "yellow" sputum.  Chest X-ray was obtained in the emergency department, which revealed bilateral pleural effusions and cardiomegaly with pulmonary vascular congestion. While in the emergency department, the [name] [name]'s hypoxia worsened requiring increased O2 support. [name] [name] refused BiPAP, but O2 was able to be maintained at 10L on high-flow nasal canula.  Interventional radiology consulted for aspiration of pleural effusions in the emergency department. Nephrology was consulted for hemodialysis in the emergency department.    Outpatient Meds:   - Acetaminophen: 325mg , oral; 1-2 tablets every 4 hours PRN - Amioderone: 200 mg, oral; 1 tablet once daily - Amitriptyline: 10 mg, oral; 1 tablet daily at bedtime - Apixiban: 2.5 mg, oral; 1 tablet twice daily - Benztropine: 1 mg, oral; 1 tablet twice daily - Calcium Acetate 668 (169 Ca) mg tablets, oral - Colchicine: 0.6 mg, oral; 1 tablet once daily - Dextromethorphan-guaifenesin: 30-600 mg 12 hr tablet, oral; 1 tablet twice daily - Docusate sodium: 100 mg, oral; 1 capsule once daily - Folic Acid: 1 mg, oral; 1 tablet once daily - Levothyroxine: 25 mcg, oral; 1 tablet once daily at 6 am - Melatonin: 3 mg, oral; 1 tablet once daily at bedtime - Midodrine: 10 mg, oral; 1 tablet three times daily - Multivitamin: oral; 1 tablet once daily at bedtime - Oxycodone-acetaminophen: 7.5-325 mg, oral; 1 tablet every 4 hours PRN - Prednisone: 10 mg, oral; 1 tablet once daily with breakfast - Pregabalin: 50 mg, oral; 1 capsule once daily at bedtime - Prochlorperazine: 5  mg, oral; 1 tablet every 6 hours PRN - Senna-docusate: 8.6-50 mg, oral; 1 tablet once daily at bedtime - Thiamine: 100 mg, oral; 1 tablet once daily - Vitamin C: 250 mg, oral; 1 tablet twice daily Per chart [name] [name] is taking Tramadol, 50 mg oral every 6 hours PRN, but [name] [name]'s father (caregiver) states that this was changed to something "stronger"  *[name] [name] and  father unaware of medication list, med list per EPIC*  Allergies: Allergies as of 04/13/2020 - Review Complete 04/13/2020  Allergen Reaction Noted  . Losartan Cough, Anaphylaxis, and Other (See Comments) 08/01/2016   Past Medical History:  Diagnosis Date  . Abdominal distension 08/10/2018  . Acute on chronic respiratory failure with hypoxia ([place])   . Acute on chronic systolic and diastolic heart failure, NYHA class 4 ([place])   . Acute pulmonary embolism without acute cor pulmonale (H[facility])   . Alcohol abuse 11/26/2010   Qualifier: Diagnosis of  By: [name] MD, Pershing Proud  . Anemia due to blood loss, acute 07/15/2012  . CHF (congestive heart failure) ([place])   . Cholecystitis 04/18/2014  . Chronic atrial fibrillation ([place])   . Cigarette smoker    has currently quit  . Cocaine abuse ([place]) 11/26/2010   Qualifier: Diagnosis of  By: [name] MD, Benjamine Mola  has been over a year  . Combined congestive systolic and diastolic heart failure ([place]) 04/18/2014   A. Echo 8/13: Severe LVH, EF 40-45%, inferoposterior akinesis, grade 2 diastolic dysfunction, moderate LAE, mild RVE, mildly reduced RVSF, mild RAE; cannot rule out R atrial mass-suggest TEE or cardiac MRI  //  B. Echo 3/17: Mild LVH, EF 25-30%, diffuse HK, grade 2 diastolic dysfunction, mild MR, severe LAE,  moderately reduced RVSF, severe RAE, mild TR, moderate PI, PASP 55 mmHg    . Dyspnea   . End stage renal disease on dialysis ([place])   . ESRD (end stage renal disease) on dialysis [facility])    MWF- [place] (05/18/2017)  . Hemodialysis [name] [name] ([place])    M,W,F  . Hernia, inguinal, right 08/05/2016  . Hypertension   . 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 Hypocalcemia 07/15/2012  . Medical non-compliance   . Non-ischemic cardiomyopathy ([place])    A. R/L HC 3/17: Normal coronary arteries, moderate pulmonary hypertension  (PASP 65 mmHg), elevated LV filling pressures (LVEDP 45 mmHg)   . NSVT (nonsustained ventricular tachycardia) ([place]) 02/22/2016  . NSVT (nonsustained ventricular tachycardia) ([place]) 02/22/2016  . Pneumonia 11/12/2017  . Polysubstance abuse ([place])   . Prolonged Q-T interval on ECG 05/18/2017  . Prolonged QT interval 05/18/2017  . Restless leg syndrome   . Solitary pulmonary nodule 10/10/2015   See cxr 10/09/2015 - CT rec 10/10/2015 >>>   . Upper airway cough syndrome 10/09/2015   Off ACEi around 1st Oct 2016  - Sinus CT 10/10/2015 >>>      Family History: Patients mother has a history of diabetes, hypertension, and renal disease. Father has history of hypertension.  Social History: [name] [name] states that he does not drink alcohol or use any illegal or illicit drugs. [name] [name] does not currently use any tobacco products, but once smoked cigarettes. [name] [name] states when he did smoke, he would smoke 1 pack every 2 weeks and was unsure the duration of smoking.  Review of Systems: A complete ROS was negative except as per HPI.  Physical Exam: Blood pressure Marland Kitchen)  142/108, pulse 99, temperature (!) 97.5 F (36.4 C), temperature source Oral, resp. rate 18, SpO2 97 %.  General: Well developed, chronically ill appearing male seated on bedside Head: Normocephalic, atraumatic, Respiratory: On O2 10L nasal canula, speaks in short sentences, respirations somewhat labored. Diminished breath sounds throughout, especially in right and left lower lobes.  Abdomen: Distended, non-tender to palpation. Normoactive bowel sounds. Bandage over mid-epigastric region. Cardiovascular: S1 normal, S2 normal. RRR. 2+ pitting edema up to the knees, bilaterally.  Neuro: Alert. Moves all extremities spontaneously. Psych:  Responds to questions appropriately with a normal affect. Skin: Right-sided chest puncture from thoracentesis. No active bleeding   Assessment & Plan by Problem: Active Problems:   Acute respiratory failure  [facility])  # Acute Respiratory Failure Dryden Tapley is a 54 year old male with a past medical history significant for hypertension, congestive heart failure, previous cardiac arrest requiring extended hospitalization (11/2019-01/2020), chronic hypoxic respiratory failure, pulmonary embolism, end-stage renal disease on dialysis, prolonged QT interval, right wrist fracture, hypocalcemia, and atrial fibrillation on Eliquis who presented to the emergency department with shortness of breath. [name] [name]'s oxygen supplementation had to be titrated up to 10L nasal canula (2L baseline). [name] [name] reports orthopnea. Chest X-ray revealed significant bilateral pleural effusions and cardiomegaly with pulmonary vascular congestion. Peripheral edema 2+, bilaterally in lower extremities up to the knees on physical examination indicating fluid overload. Acute respiratory failure likely due to pleural effusions secondary to volume overload from end-stage renal disease. IR drained 400 mL amber fluid from right pleural effusion, fluid not sent for analysis. Plan: - Will likely need drainage of left-sided pleural effusion tomorrow - Monitor SpO2 with pulse oximetry - Maintain O2 saturation greater than 90% - Fluid restriction - Continuous cardiac monitoring - Daily Weights - Monitor Intake and Output  # End-stage Renal Disease Per chart review, [name] [name] has been unable to achieve full volume removal with hemodialysis outpatient. [name] [name] is 10 kg over outpatient estimated dry weight with peripheral edema suggesting fluid overload. Plan: - Hemodialysis. Nephrology was consulted in emergency department and plan to perform urgent hemodialysis today. - Comprehensive metabolic panel to monitor electrolytes and BUN/creatinine.  # History of Cardiac Arrest [name] [name] has a history of cardiac arrest that resulted in prolonged hospital admission. Troponin are elevated but without significant delta. [name] [name] without chest pain or  significant signs of acute ischemia on ECG. Elevation is likely secondary to volume overload.  Plan: - If [name] [name] develops symptoms concerning for acute coronary syndrome, will repeat troponins and ECG.  # HFrEF, EF 30-35% # Elevated blood pressure [name] [name] has a history of hypertension but is currently on midodrine as outpatient. Acute elevation in blood pressure likely due to volume overload. Anticipate pressures will decline following HD, will continue midodrine Plan: - Hemodialysis to mitigate volume overload. Nephrology consulted and plan to do hemodialysis today. - Continue midodrine  # Atrial fibrillation [name] [name] has history of atrial fibrillation with outpatient Eliquis for clotting prevention. Plan: - Continue Eliquis for clot prevention - Continue amiodarone 200 mg daily  # Anemia [name] [name]'s initial labs reported Hgb 11.1, but has since increased to 16.3. Plan: - CBC with differential to monitor hemoglobin.  # Bilateral foot wounds [name] [name] reports bilateral foot wounds that occasionally drain. [name] [name]'s father maintains wound care with dressings. Plan: -Consult wound care for evaluation and management of wounds. -Continue amitriptyline 10 mg + pregabalin 50 mg at bedtime for chronic pain  # Pericardial effusion: Continue home colchicine # EPS symptoms: Previously on seroquel, developed EPS symptoms, started  on benztropine. Continue benztropine # Hypothyroidism: Continue home levothyroxine 25 mcg daily. Last TSH elevated at 6.9 in January 2021  DVT prophylaxis: On therapeutic anticoagulation with eliquis Code Status: Full Dispo: Admit [name] [name] to Inpatient with expected length of stay greater than 2 midnights.  Signed: Nanetta Batty, Medical Student 04/13/2020, 3:44 PM   Attestation for Student Documentation:  I personally was present and performed or re-performed the history, physical exam and medical decision-making activities of this service and have verified that  the service and findings are accurately documented in the student's note.  Jeanmarie Hubert, MD 04/13/2020, 6:56 PM

## 2020-04-13 NOTE — Consult Note (Signed)
NAME:  [name] [name], MRN:  [id number], DOB:  [birth year], LOS: 0 ADMISSION DATE:  04/13/2020, CONSULTATION DATE:  04/13/20 REFERRING MD:  Langston Masker - EM, CHIEF COMPLAINT:  SOB  Brief History   54 yo presenting with worsening SOB, hypoxia. ESRD pt who has been recommended for extra runs of iHD outpatient but refuses. CXR with bilateral pleural effusions, pulmonary edema.   History of present illness   54 yo M PMH ESRD on T/R/Sa HD, HTN, CHF, PE and  Afib (now on eliquis), prior cardiac arrest with prolonged hospitalization (12/20-2/21), prior tracheostomy status who now wears 2LNC at baseline, who presented to [facility] 4/30 with worsening SOB. WOB has been progressive x 2weeks, and is worse when laying flat thus causing sleep disturbances. Today, patient was referred to seek medical attn by PT who noted pt was SOB. Presented to IM clinic  who noted patient was somnolent, hypoxic, SOB and referred to ED. Prior to presentation, patient tried increasing home O2 to 2.5L without significant improvement. Denied cough, wheezing, chest pain, unilateral extremity swelling. Endorsed mild BLE edema.  In ED, pt SpO2 96% on 2.5L with increased respiratory effort. CXR with pulmonary edema, bilateral pleural effusions. In ED, worsening hypoxia requiring increasing O2 support, to BiPAP (briefly attempted, pt refused), before arriving at 10L HFNC.   Seen in ED and joined by nephrology PA. Pt is 10Kg up from dry weight and iHD session is recommended. Patient is frustrated to learn this, weighs leaving AMA. Risks of leaving AMA discussed thoroughly with patient by myself and nephro PA, including risk of death. Patient ultimately agrees to stay in ED/agrees to admission for treatment.   Past Medical History  Cardiac arrest ESRD HTN CHF Polysubstance use disorder PE Afib  NICM  Prolonged qtc RLS Anemia Acute on chronic respiratory failure requiring tracheostomy, now decannulated  Significant Hospital Events   4/30  presents to ED with SOB, hypoxia. Found to be volume overloaded. Wishes to leave AMA but ultimately agrees to stay  Consults:  Nephrology PCCM   Procedures:    Significant Diagnostic Tests:  CXR 4/30> bilateral pleural effusions. Pulmonary edema, vascular congestion. Bibasilar atelectasis.   Micro Data:  4/30 SARS Cov2> neg 4/30 Flu A/B> neg   Antimicrobials:    Interim history/subjective:  Long conversation at bedside with pt and father. Pt is very frustrated with SOB and is frustrated with state of current chronic health problems as they contribute to acute problems.   Frustrated, wishes to leave AMA. Recommended that pt stay in ED and allow medical team opportunity to help him feel better and help alleviate some of the burden of current SOB. Discussed that leaving AMA presents great risk of worsening hypoxia, worsening SOB, death. Patient given time to consider the weight of this decision and ultimately agrees to stay in ED, requests food.   Objective   Blood pressure (!) 145/101, pulse 85, temperature (!) 97.5 F (36.4 C), temperature source Oral, resp. rate 12, SpO2 96 %.       No intake or output data in the 24 hours ending 04/13/20 1239 There were no vitals filed for this visit.  Examination: General: Chronically ill appearing middle aged M, seated in ED stretcher with increased respiratory effort.  HENT: NCAT Pink mmm. Healed stoma. Mildly icteric sclera.  Lungs: Diminished bibasilar lung sounds. Bilateral crackles. Symmetrical chest expansion.  Cardiovascular: RRR with intermittent PVC on monitor.  Abdomen: Protuberant, soft, non-tender Extremities: RUE wrapped. No obvious joint deformity LUE BLE. No cyanosis or  clubbing  Neuro: AAOx3 following commands. Delayed speech pattern.  GU: defer  Skin: dry/peeling palms of hand. Clean, dry, warm Psych: frustrated mood and congruent affect.   Resolved Hospital Problem list     Assessment & Plan:   Acute respiratory  failure with hypoxia and hypercarbia Bilateral Pleural Effusions Pulmonary edema -in setting of ESRD with volume overload, CHF -outpatient extra runs of HD have been recommended but pt declines   -COVID-19 pending P -Most beneficial therapy we can offer at this juncture is volume removal with HD -if remains symptomatic after adequate volume optimizations, could potentially reconsider thoracentesis however risks/benefits of this procedure would need to be evaluated again at that time. At present, risks outweigh benefit and there are less invasive options (HD for volume removal) that we should first explore  -Titrate O2 for goal > 92 -IS  -Would limit CNS depressing medications and recommend reviewing home meds prior to discharge   ESRD T/R/Sa iHD -pt seems to be struggling with totality of managing ESRD and is not very open to dynamic nature of managing chronic illness. He is frustrated that despite attending T/R/Sa HD, he may need additional HD sessions  P -HD plans per nephro  Hx Afib Hx PE -on eliquis P -cardiac monitoring -if thora becomes indicated, consider timing with eliquis   Best practice:  Diet: fluid restriction  Pain/Anxiety/Delirium protocol (if indicated): na VAP protocol (if indicated): na DVT prophylaxis: per primary  GI prophylaxis: na  Glucose control: monitor  Mobility: PT  Code Status: Full  Family Communication: Long conversation with pt and father at bedside in ED 4/30  Disposition: Admit to IM service   Labs   CBC: Recent Labs  Lab 04/13/20 1044 04/13/20 1225  WBC 11.1*  --   NEUTROABS 8.8*  --   HGB 12.7* 16.3  HCT 44.9 48.0  MCV 102.7*  --   PLT 221  --     Basic Metabolic Panel: Recent Labs  Lab 04/13/20 1044 04/13/20 1225  NA 140 137  K 4.3 4.3  CL 93*  --   CO2 31  --   GLUCOSE 118*  --   BUN 36*  --   CREATININE 5.81*  --   CALCIUM 8.7*  --    GFR: Estimated Creatinine Clearance: 15 mL/min (A) (by C-G formula based on SCr  of 5.81 mg/dL (H)). Recent Labs  Lab 04/13/20 1044  WBC 11.1*    Liver Function Tests: Recent Labs  Lab 04/13/20 1044  AST 16  ALT 19  ALKPHOS 124  BILITOT 0.8  PROT 6.4*  ALBUMIN 3.2*   No results for input(s): LIPASE, AMYLASE in the last 168 hours. No results for input(s): AMMONIA in the last 168 hours.  ABG    Component Value Date/Time   PHART 7.373 01/26/2020 1123   PCO2ART 48.0 01/26/2020 1123   PO2ART 73.7 (L) 01/26/2020 1123   HCO3 32.6 (H) 04/13/2020 1225   TCO2 35 (H) 04/13/2020 1225   ACIDBASEDEF 0.5 01/19/2020 1508   O2SAT 99.0 04/13/2020 1225     Coagulation Profile: No results for input(s): INR, PROTIME in the last 168 hours.  Cardiac Enzymes: No results for input(s): CKTOTAL, CKMB, CKMBINDEX, TROPONINI in the last 168 hours.  HbA1C: Hgb A1c MFr Bld  Date/Time Value Ref Range Status  12/25/2019 03:50 AM 4.7 (L) 4.8 - 5.6 % Final    Comment:    (NOTE) Pre diabetes:          5.7%-6.4% Diabetes:              >  6.4% Glycemic control for   <7.0% adults with diabetes   12/02/2019 05:27 AM 6.2 (H) 4.8 - 5.6 % Final    Comment:    (NOTE) Pre diabetes:          5.7%-6.4% Diabetes:              >6.4% Glycemic control for   <7.0% adults with diabetes     CBG: No results for input(s): GLUCAP in the last 168 hours.  Review of Systems:   As per HPI   Past Medical History  He,  has a past medical history of Abdominal distension (08/10/2018), Acute on chronic respiratory failure with hypoxia (HCC), Acute on chronic systolic and diastolic heart failure, NYHA class 4 ([place]), Acute pulmonary embolism without acute cor pulmonale ([place]), Alcohol abuse (11/26/2010), Anemia due to blood loss, acute (07/15/2012), CHF (congestive heart failure) ([place]), Cholecystitis (04/18/2014), Chronic atrial fibrillation ([place]), Cigarette smoker, Cocaine abuse ([place]) (11/26/2010), Combined congestive systolic and diastolic heart failure ([place]) (04/18/2014), Dyspnea, End stage renal disease  on dialysis ([place]), ESRD (end stage renal disease) on dialysis [facility]), Hemodialysis patient ([place]), Hernia, inguinal, right (08/05/2016), Hypertension, 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) (02/22/2016), NSVT (nonsustained ventricular tachycardia) ([place]) (02/22/2016), Pneumonia (11/12/2017), Polysubstance abuse ([place]), Prolonged Q-T interval on ECG (05/18/2017), Prolonged QT interval (05/18/2017), Restless leg syndrome, Solitary pulmonary nodule (10/10/2015), and Upper airway cough syndrome (10/09/2015).   Surgical History    Past Surgical History:  Procedure Laterality Date  . [place] TRANSPOSITION  01/19/2013   Procedure: BASCILIC VEIN TRANSPOSITION;  Surgeon: Conrad Stephen, MD;  Location: [place];  Service: Vascular;  Laterality: Left;  left 2nd stage basilic vein transposition  . CARDIAC CATHETERIZATION N/A 02/25/2016   Procedure: Right/Left Heart Cath and Coronary Angiography;  Surgeon: Burnell Blanks, MD;  Location: [place] CV LAB;  Service: Cardiovascular;  Laterality: N/A;  . INGUINAL HERNIA REPAIR Right 08/05/2016   Procedure: RIGHT INGUINAL HERNIA REPAIR WITH MESH;  Surgeon: Donnie Mesa, MD;  Location: [place];  Service: General;  Laterality: Right;  . INSERTION OF DIALYSIS CATHETER  07/17/2012   Procedure: INSERTION OF DIALYSIS CATHETER;  Surgeon: Conrad [place], MD;  Location: [place];  Service: Vascular;  Laterality: Right;  right internal jugular  . INSERTION OF MESH Right 08/05/2016   Procedure: INSERTION OF MESH;  Surgeon: Donnie Mesa, MD;  Location: [place];  Service: General;  Laterality: Right;  . IR GASTROSTOMY TUBE MOD SED  01/09/2020  . IR GASTROSTOMY TUBE REMOVAL  03/28/2020  . IR PATIENT EVAL TECH 0-60 MINS  03/20/2020  . IR THORACENTESIS ASP PLEURAL SPACE W/IMG GUIDE  01/12/2020  . IR  THORACENTESIS ASP PLEURAL SPACE W/IMG GUIDE  02/21/2020  . REVISION OF ARTERIOVENOUS GORETEX GRAFT Left 02/09/3334   Procedure: PLICATION OF ARTERIOVENOUS FISTULA LEFT ARM;  Surgeon: Elam Dutch, MD;  Location: [place];  Service: Vascular;  Laterality: Left;  . REVISON OF ARTERIOVENOUS FISTULA Left 01/06/2017   Procedure: REVISON OF ARTERIOVENOUS FISTULA;  Surgeon: Conrad Camargo, MD;  Location: [place];  Service: Vascular;  Laterality: Left;  . REVISON OF ARTERIOVENOUS FISTULA Left 10/27/2018   Procedure: REVISION PLICATION OF ARTERIOVENOUS FISTULA  LEFT UPPER ARM;  Surgeon: Waynetta Sandy, MD;  Location: [place];  Service: Vascular;  Laterality: Left;  . TRACHEOSTOMY TUBE PLACEMENT N/A 11/24/2019   Procedure: TRACHEOSTOMY;  Surgeon: Izora Gala,  MD;  Location: [place];  Service: ENT;  Laterality: N/A;  . UMBILICAL HERNIA REPAIR N/A 08/05/2016   Procedure: [place];  Surgeon: Donnie Mesa, MD;  Location: [name];  Service: General;  Laterality: N/A;  . VENOGRAM N/A 08/09/2012   Procedure: VENOGRAM;  Surgeon: Conrad [place], MD;  Location: [facility] C[facility] LAB;  Service: Cardiovascular;  Laterality: N/A;     Social History   reports that he quit smoking about 17 months ago. His smoking use included cigarettes. He quit after 30.00 years of use. He has never used smokeless tobacco. He reports current alcohol use of about 4.0 standard drinks of alcohol per week. He reports that he does not use drugs.   Family History   His family history includes Diabetes in his mother; Hypertension in his father and mother; Renal Disease in his mother.   Allergies Allergies  Allergen Reactions  . Losartan Cough, Anaphylaxis and Other (See Comments)     Home Medications  Prior to Admission medications   Medication Sig Start Date End Date Taking? Authorizing Provider  acetaminophen (TYLENOL) 325 MG tablet Take 1-2 tablets (325-650 mg total) by mouth every 4 (four) hours as needed for mild  pain. 02/27/20   Love, Ivan Anchors, PA-C  amiodarone (PACERONE) 200 MG tablet Take 1 tablet (200 mg total) by mouth daily. 02/27/20   Love, Ivan Anchors, PA-C  amitriptyline (ELAVIL) 10 MG tablet Take 1 tablet (10 mg total) by mouth at bedtime. 04/06/20   Raulkar, Clide Deutscher, MD  apixaban (ELIQUIS) 2.5 MG TABS tablet Take 1 tablet (2.5 mg total) by mouth 2 (two) times daily. 02/27/20   Love, Ivan Anchors, PA-C  benztropine (COGENTIN) 1 MG tablet Take 1 tablet (1 mg total) by mouth 2 (two) times daily. 02/27/20   Love, Ivan Anchors, PA-C  Calcium Acetate 668 (169 Ca) MG TABS Take by mouth. 03/13/20   [provider]  colchicine 0.6 MG tablet Take 1 tablet (0.6 mg total) by mouth daily. 02/28/20   Love, Ivan Anchors, PA-C  dextromethorphan-guaiFENesin (MUCINEX DM) 30-600 MG 12hr tablet Take 1 tablet by mouth 2 (two) times daily. 02/27/20   Love, Ivan Anchors, PA-C  docusate sodium (COLACE) 100 MG capsule Take 1 capsule (100 mg total) by mouth daily. 04/06/20 04/06/21  Izora Ribas, MD  folic acid (FOLVITE) 1 MG tablet Take 1 tablet (1 mg total) by mouth daily. 02/28/20   Love, Ivan Anchors, PA-C  levothyroxine (SYNTHROID) 25 MCG tablet Take 1 tablet (25 mcg total) by mouth daily at 6 (six) AM. 02/28/20   Love, Ivan Anchors, PA-C  Melatonin 3 MG TABS Take 1 tablet (3 mg total) by mouth at bedtime. To help with sleep 02/27/20   Love, Ivan Anchors, PA-C  midodrine (PROAMATINE) 10 MG tablet Take 1 tablet (10 mg total) by mouth 3 (three) times daily with meals. 02/27/20   Love, Ivan Anchors, PA-C  multivitamin (RENA-VIT) TABS tablet Take 1 tablet by mouth at bedtime. 04/06/20   Raulkar, Clide Deutscher, MD  oxyCODONE-acetaminophen (PERCOCET) 7.5-325 MG tablet Take 1 tablet by mouth every 4 (four) hours as needed for severe pain. 04/06/20   Raulkar, Clide Deutscher, MD  predniSONE (DELTASONE) 10 MG tablet Take 1 tablet (10 mg total) by mouth daily with breakfast. 02/28/20   Love, Ivan Anchors, PA-C  pregabalin (LYRICA) 50 MG capsule Take 1 capsule (50 mg total) by  mouth at bedtime. 03/07/20   Raulkar, Clide Deutscher, MD  prochlorperazine (COMPAZINE) 5 MG  tablet Take 1 tablet (5 mg total) by mouth every 6 (six) hours as needed for nausea or vomiting. 02/27/20   Love, Ivan Anchors, PA-C  senna-docusate (SENOKOT-S) 8.6-50 MG tablet Take 1 tablet by mouth at bedtime. 04/06/20   Raulkar, Clide Deutscher, MD  thiamine 100 MG tablet Take 1 tablet (100 mg total) by mouth daily. 02/28/20   Love, Ivan Anchors, PA-C  traMADol (ULTRAM) 50 MG tablet Take 1 tablet (50 mg total) by mouth every 6 (six) hours as needed for moderate pain or severe pain. 03/07/20   Raulkar, Clide Deutscher, MD  vitamin C (ASCORBIC ACID) 250 MG tablet Take 1 tablet (250 mg total) by mouth 2 (two) times daily. 02/27/20   Love, Ivan Anchors, PA-C     Eliseo Gum MSN, AGACNP-BC [place] [phone] If no answer, [phone] 04/13/2020, 2:44 PM

## 2020-04-14 DIAGNOSIS — J9601 Acute respiratory failure with hypoxia: Secondary | ICD-10-CM

## 2020-04-14 DIAGNOSIS — Z9115 Patient's noncompliance with renal dialysis: Secondary | ICD-10-CM

## 2020-04-14 DIAGNOSIS — E039 Hypothyroidism, unspecified: Secondary | ICD-10-CM

## 2020-04-14 DIAGNOSIS — D7589 Other specified diseases of blood and blood-forming organs: Secondary | ICD-10-CM

## 2020-04-14 DIAGNOSIS — Z992 Dependence on renal dialysis: Secondary | ICD-10-CM

## 2020-04-14 LAB — MRSA PCR SCREENING: MRSA by PCR: POSITIVE — AB

## 2020-04-14 LAB — HEPATITIS B SURFACE ANTIGEN: Hepatitis B Surface Ag: NONREACTIVE

## 2020-04-14 MED ORDER — CHLORHEXIDINE GLUCONATE CLOTH 2 % EX PADS
6.0000 | MEDICATED_PAD | Freq: Every day | CUTANEOUS | Status: DC
  Administered 2020-04-14 – 2020-04-17 (×4): 6 via TOPICAL

## 2020-04-14 MED ORDER — MUPIROCIN 2 % EX OINT
1.0000 "application " | TOPICAL_OINTMENT | Freq: Two times a day (BID) | CUTANEOUS | Status: AC
  Administered 2020-04-14 – 2020-04-18 (×10): 1 via NASAL
  Filled 2020-04-14 (×2): qty 22

## 2020-04-14 MED ORDER — RENA-VITE PO TABS
1.0000 | ORAL_TABLET | Freq: Every day | ORAL | Status: DC
  Administered 2020-04-14 – 2020-04-18 (×4): 1 via ORAL
  Filled 2020-04-14 (×4): qty 1

## 2020-04-14 MED ORDER — OXYCODONE-ACETAMINOPHEN 5-325 MG PO TABS
1.0000 | ORAL_TABLET | Freq: Once | ORAL | Status: AC
  Administered 2020-04-14: 1 via ORAL
  Filled 2020-04-14: qty 1

## 2020-04-14 MED ORDER — OXYCODONE-ACETAMINOPHEN 7.5-325 MG PO TABS
1.0000 | ORAL_TABLET | ORAL | Status: DC | PRN
  Administered 2020-04-14 – 2020-04-18 (×6): 1 via ORAL
  Filled 2020-04-14 (×7): qty 1

## 2020-04-14 MED ORDER — PRO-STAT SUGAR FREE PO LIQD
30.0000 mL | Freq: Two times a day (BID) | ORAL | Status: DC
  Administered 2020-04-14 – 2020-04-17 (×7): 30 mL via ORAL
  Filled 2020-04-14 (×7): qty 30

## 2020-04-14 MED ORDER — NEPRO/CARBSTEADY PO LIQD
237.0000 mL | Freq: Two times a day (BID) | ORAL | Status: DC
  Administered 2020-04-14 – 2020-04-17 (×3): 237 mL via ORAL

## 2020-04-14 MED ORDER — APIXABAN 2.5 MG PO TABS
2.5000 mg | ORAL_TABLET | Freq: Two times a day (BID) | ORAL | Status: DC
  Administered 2020-04-14 – 2020-04-19 (×10): 2.5 mg via ORAL
  Filled 2020-04-14 (×12): qty 1

## 2020-04-14 NOTE — Progress Notes (Signed)
Patient is back from dialysis and refused RN to do anything for him. NT called to go fix his leads. [name] is at bedside at this time. Will continue to monitor patient.

## 2020-04-14 NOTE — Progress Notes (Signed)
Brief Pulm Note  Seems like getting better with volume removal.  Nothing else to add, call if questions.  Erskine Emery MD PCCM

## 2020-04-14 NOTE — Progress Notes (Signed)
Patient ID: [name] [name], male   DOB: 02/20/1966, 54 y.o.   MRN: [id number] [facility] Progress Note   Assessment/ Plan:   1.  Shortness of breath: This is secondary to combination of pulmonary edema and pleural effusions; improved overnight with hemodialysis and right-sided thoracentesis.  He remains on supplemental oxygen at 4 L a minute via nasal cannula.  Based on review of the x-rays, may need left-sided thoracentesis as well. 2.  ESRD:  He is usually on a TTS dialysis schedule and underwent emergent hemodialysis yesterday for volume unloading, will undertake hemodialysis today to continue his outpatient schedule with efforts at continued ultrafiltration/lowering dry weight.  3.  Hypertension/volume:  With elevated blood pressure predialysis with intradialytic blood pressure drop limiting ultrafiltration; remains volume overloaded and we will attempt gradual lowering of dry weight as tolerated. 4.  Anemia:  Hemoglobin and hematocrit currently at goal without ESA.  5.  Metabolic bone disease:  Continue calcium acetate for phosphorus binding and calcitriol for PTH control. 6. Nutrition:   Improving appetite/weight following his prolonged discharge from the hospital, reevaluate EDW.   Subjective:   Events from overnight noted with right-sided thoracentesis yielding 0.4 L serous appearing fluid and emergent hemodialysis with ultrafiltration of 2.8 L.  Behavioral issues overnight.   Objective:   BP (!) 136/102 (BP Location: Right Arm)   Pulse 97   Temp 98.4 F (36.9 C) (Oral)   Resp 18   Wt 68.3 kg   SpO2 99%   BMI 22.24 kg/m   Physical Exam: Gen: Appears uncomfortable, chronically ill resting in bed. CVS: Pulse regular rhythm, normal rate, S1 and S2 normal Resp: Diminished breath sounds over bases without distinct rales or rhonchi Abd: Soft, obese, nontender Ext: 2+ bilateral pitting lower extremity edema  Labs: BMET Recent Labs  Lab 04/13/20 1044 04/13/20 1225   NA 140 137  K 4.3 4.3  CL 93*  --   CO2 31  --   GLUCOSE 118*  --   BUN 36*  --   CREATININE 5.81*  --   CALCIUM 8.7*  --    CBC Recent Labs  Lab 04/13/20 1044 04/13/20 1225  WBC 11.1*  --   NEUTROABS 8.8*  --   HGB 12.7* 16.3  HCT 44.9 48.0  MCV 102.7*  --   PLT 221  --       Medications:    . amiodarone  200 mg Oral Daily  . amitriptyline  10 mg Oral QHS  . benztropine  1 mg Oral BID  . Chlorhexidine Gluconate Cloth  6 each Topical Daily  . dextromethorphan-guaiFENesin  1 tablet Oral BID  . docusate sodium  100 mg Oral Daily  . feeding supplement (NEPRO CARB STEADY)  237 mL Oral BID BM  . folic acid  1 mg Oral Daily  . levothyroxine  25 mcg Oral QAC breakfast  . midodrine  10 mg Oral TID WC  . mupirocin ointment  1 application Nasal BID  . predniSONE  10 mg Oral Q breakfast  . pregabalin  50 mg Oral QHS  . sodium chloride flush  3 mL Intravenous Once  . thiamine  100 mg Oral Daily  . vitamin C  250 mg Oral BID   Elmarie Shiley, MD 04/14/2020, 8:33 AM

## 2020-04-14 NOTE — Progress Notes (Signed)
Patient refused Am labs. Pulled off tele leads and pulse ox. Dr Madilyn Fireman paged and notified.

## 2020-04-14 NOTE — Progress Notes (Signed)
Initial Nutrition Assessment  DOCUMENTATION CODES:   Not applicable  INTERVENTION:  Continue Nepro po BID,  each supplement provides 425 kcal and 19 grams protein Prostat 30 ml BID, each supplement provides 100 kcal and 15 grams of protein Renavit po daily   NUTRITION DIAGNOSIS:   Increased nutrient needs related to chronic illness, wound healing(ESRD on HD; chronic BLE wounds) as evidenced by estimated needs.    GOAL:   Patient will meet greater than or equal to 90% of their needs    MONITOR:   PO intake, Supplement acceptance, I & O's, Labs, Skin, Weight trends  REASON FOR ASSESSMENT:   Malnutrition Screening Tool    ASSESSMENT:  RD working remotely.  54 year old male with past medical history significant for HTN, CHF, previous cardiac arrest requiring extended hospitalization (12/20-2/21), chronic hypoxic respiratory failure, pulmonary embolism, ESRD on HD, hypocalcemia and atrial fibrillation presented due to SOB and increases in home oxygen therapy from 2 to 2.5 L, noted having difficulty with speaking in complete sentences without having to take a breath, endorses significant orhtopnea as well as peripheral edema, abdominal distention, and chronic cough productive of yellow sputum. CXR revealed bilateral pleural effusions and cardiomegaly with pulmonary vascular congestion, worsening hypoxia in ED requiring increased O2 support maintained on 10L HFNC, and IR consulted in ED for aspiration of pleural effusions.  Patient admitted on 4/30 for acute hypoxic respiratory failure secondary to volume overload.  4/30 Right thoracentesis with 400 ml removed;  HD Net UF 4L  Patient noted with improved respiratory status overnight with volume removal, plans for repeat HD today. Patient breathing comfortably on 4L [place]. Patient noted with improving appetite, per flowsheets he consumed 100% of breakfast this morning and is provided Nepro Shake BID per medications.   Non-pitting RUE,  Moderate pitting BLE edema noted per RN assessment. EDW: 61.5 kg (135.3 lbs) HD TTS Current wt 150.26 lbs  Per notes: -weight improving s/p prolonged admission -unable to meet outpt EDW d/t hypotension during HD, patient did not attend recommended extra sessions -reevaluate EDW once respiratory status improves per nephrology -pt refusing wound care for LE wounds, wound care consulted -continue to wean supplemental O2 as able -dispo pending further improvement in respiratory failure  Medications reviewed and include: colace, folic acid, prednisone, B1, Vit C, lyrica, congentin, elavil, pacerone 4/30 labs reviewed  NUTRITION - FOCUSED PHYSICAL EXAM: Unable to complete at this time, RD working remotely.  Diet Order:   Diet Order            Diet renal with fluid restriction Fluid restriction: 1200 mL Fluid; Room service appropriate? Yes; Fluid consistency: Thin  Diet effective now              EDUCATION NEEDS:   No education needs have been identified at this time  Skin:  Skin Assessment: Skin Integrity Issues: Skin Integrity Issues:: Other (Comment) Other: chronic bilateral LE wounds  Last BM:  PTA  Height:   Ht Readings from Last 1 Encounters:  04/06/20 5\' 9"  (1.753 m)    Weight:   Wt Readings from Last 1 Encounters:  04/14/20 68.3 kg    BMI:  Body mass index is 22.24 kg/m.  Estimated Nutritional Needs:   Kcal:  3846-6599  Protein:  110-120  Fluid:  1.2 L/day per MD   Lajuan Lines, RD, LDN Clinical Nutrition After Hours/Weekend Pager # in [place]

## 2020-04-14 NOTE — Discharge Instructions (Signed)

## 2020-04-14 NOTE — Progress Notes (Signed)
UPDATE NOTE  Called and spoke with pt's father with whom he lives with. Discussed dyspnea likely 2/2 volume overload and is improving with volume removal.  Father notes some concern regarding confusion when the pt first woke up this morning and did not know where he was. Relayed that pt did not appear confused when we rounded on him this morning but will keep an eye on it. All questions were answered.  [name], MD 04/14/20  12:45 PM

## 2020-04-14 NOTE — Progress Notes (Signed)
Patient states "I don't feel good take me off" informed patient I will check his BP to see if a normal saline bolus is required; however, I informed the patient I am in the process of rinsing him back. Patient states hurry up and take me off this machine now and attempted to get off stretcher. Complete rinse back and informed patient I am removing fistula needles from LAVF. Patient allowed needles to be removed then started scooting to the bottom of the stretcher stating he was going to walk to his room. I was able to call for assistance from a nursing staff on [place] to assist with monitoring patient. Patient took off O2 nasal cannula and pulled off EKG leads refusing further assessment or care. Patient calmed down after 45 minutes sitting on stretcher with siderails up allowing transport to take him to his room. Patient allowed me to put O26lpm via nasal cannula on him to transport to room. Report given to Hermina Barters RN

## 2020-04-14 NOTE — Progress Notes (Addendum)
NAME:  [name] [name], MRN:  [id number], DOB:  05/27/1966, LOS: 1 ADMISSION DATE:  04/13/2020   Brief History  54 yo male with hypertension, CHF, chronic hypoxic respiratory failure, HD dependent ESRD, afib and recent hospitalization 12/20-2/21 following cardiac arrest who was admitted IMTS on 04/13/20 for acute hypoxic respiratory failure 2/2 volume overload.  Subjective  No overnight events. Patient reports that he is feeling fine today. He feels like he did okay when he had his oxygen off. He reports that his feet are bothering him. He reports that he came in because he was having issues laying down at home, he reports that he still was not able to lay down flat last night. He reports that he had some fluid taken off last yesterday.   Significant Hospital Events   4/30 admission for hypoxic resp failure>right thoracentesis (442mL); HD 5/1 improved respiratory status overnight with volume removal. Neph planning for another run of HD today.  Objective   Blood pressure (!) 147/79, pulse 98, temperature (!) 97.5 F (36.4 C), temperature source Oral, resp. rate 19, weight 68.3 kg, SpO2 93 %.     Intake/Output Summary (Last 24 hours) at 04/14/2020 0454 Last data filed at 04/13/2020 1604 Gross per 24 hour  Intake --  Output 400 ml  Net -400 ml   Filed Weights   04/14/20 0439  Weight: 68.3 kg    Examination: GENERAL: chronically ill appearing CARDIAC: heart RRR.  PULMONARY: breathing comfortably on nasal canula 2L. Able to speak full sentences. Lung sounds diminished bilaterally > at the bases NEURO: a/o.  SKIN: wraps to bilateral lower extremities.   Consults:  Nephrology IR  Significant Diagnostic Tests:  4/30 CXR>>cardiomegaly with vasc congestion. Sizeable pleural effusions b/l. Bibasilar atelectasis.  Micro Data:  MRSA +  Antimicrobials:  n/a  Labs    CBC Latest Ref Rng & Units 04/13/2020 04/13/2020 02/24/2020  WBC 4.0 - 10.5 K/uL - 11.1(H) 11.2(H)  Hemoglobin 13.0 -  17.0 g/dL 16.3 12.7(L) 12.8(L)  Hematocrit 39.0 - 52.0 % 48.0 44.9 42.9  Platelets 150 - 400 K/uL - 221 194   BMP Latest Ref Rng & Units 04/13/2020 04/13/2020 02/24/2020  Glucose 70 - 99 mg/dL - 118(H) 141(H)  BUN 6 - 20 mg/dL - 36(H) 33(H)  Creatinine 0.61 - 1.24 mg/dL - 5.81(H) 3.97(H)  Sodium 135 - 145 mmol/L 137 140 136  Potassium 3.5 - 5.1 mmol/L 4.3 4.3 2.8(L)  Chloride 98 - 111 mmol/L - 93(L) 95(L)  CO2 22 - 32 mmol/L - 31 23  Calcium 8.9 - 10.3 mg/dL - 8.7(L) 8.6(L)    Summary  54 yo male who is chronically ill with combined heart failure (EF 30-35%),  afib, HD dependent ESRD who was admitted to IMTS on 4/30 for acute hypoxic resp failure 2/2 hypervolemia.  Assessment & Plan:  Active Problems:   Acute respiratory failure (HCC)  Acute on chronic hypercapnic hypoxic respiratory failure with respiratory acidosis 2/2 pulm edema>hypervolemia in the setting of HD dependent ESRD and HFrEF. 2L supplemental O2 requirement at baseline. 10L supplemental O2 requirement on admission. underwent thoracentesis and HD last night. Now on 4L [place].  Large Bilateral pleural effusions. S/p right thoracentesis 4/30--432mL off. Given his improvement in respiratory status with HD yesterday and liklihood for reaccumulation of fluid, would hold off on thoracentesis at this time. Suspect to see improvement with another HD session today. Plan Continue to wean supplemental O2 as able  HD dependent ESRD--TTS schedule. Nephrology on board. Underwent extra HD on  day of admission--UF 4L. Plan: management per nephrology--planning for another HD session today. calitriol and calcium acetate. Continue midodrine.  Chronic HFrEF (EF 30-35%).  Plan: tele monitoring. Fluid restriction. Daily weights. Strict I/O Atrial fibrillation. eliquis held on admission. Will restart eliquis today. Continue  amiodarone 200mg  daily History of small pericardial effusion in the setting of PEA cardiac arrest (jan 2021). Has been on  colchicine and prednisone for this. Will curbside cardiology to see if this is necessary to continue at this time. Hypothyroidism. Continue synthroid.  Chronic Bilateral LE wounds. Pt refusing wound care. Plan: wound care consulted. Allow father to change dressings. Restarted home percocet prn.   Best practice:  CODE STATUS: FULL Diet: renal, fl restriction DVT for prophylaxis: eliquis Social considerations/Family communication: father Dispo: pending further improvement in resp failure   [name] Hansen, MD [facility] PGY-1 PAGER #: [phone] 04/14/20  4:54 AM

## 2020-04-14 NOTE — Progress Notes (Signed)
Date: 04/14/2020  Patient name: [name] [name]  Medical record number: [id number]  Date of birth: 20-Feb-1966   I have seen and evaluated [name] [name] and discussed their care with the Residency Team. [name] is a 54 year old gentleman with biventricular heart failure, chronic hypoxic respiratory failure, and end-stage renal disease on hemodialysis. He presented to the clinic on April 30 and was found to be hypoxic and somnolent and was transported to the ED for further work-up and treatment. Dr. Benjamine Mola documented his HPI on his admission note. Today, the patient states he has orthopnea and is unable to lie flat. He is concerned about the amount of fluid removed from his right lung via thoracentesis yesterday. He is also worried about the condition of his feet bilaterally.  He was found to be in acute on chronic hypoxic hypercapnic respiratory failure likely due to gross volume overload secondary to his ESRD (Dr. Posey Pronto indicates nonadherence to HD sessions, signing off early) and heart failure. Patient had HD yesterday, the day of admission, with 2.8 L removed. Following HD, his oxygen requirements decreased significantly.  This morning, he remains in mild respiratory distress. His O2 sat is 93% on room air and currently he is on 4 L with O2 sats of 99%. He has multiple concerns about the care he has received, going back as far as January.  Vitals:   04/14/20 0439 04/14/20 0816  BP: (!) 147/79 (!) 136/102  Pulse: 98 97  Resp: 19 18  Temp: (!) 97.5 F (36.4 C) 98.4 F (36.9 C)  SpO2: 93% 99%  Gen sitting side of bed, very dyspneic, speech limited to 2-3 word phrases.  No tachypnea.  No increased accessory muscle usage. Lungs crackles in the L base, decreased in the right base Neuro abnormal movement right hands question asterixis but did not do formal testing  Pertinent labs CO2 31 Gap 16 Albumin 3.2 Hemoglobin 12.7 MCV 103 RDW 26 TSH in January was 7 Vitamin B12 was 520 in 2013  HIV negative earlier this year  I personally viewed the CXR images and confirmed my reading with the official read. Bilateral pleural effusions, right greater than left  I personally viewed the EKG and confirmed my reading with the official read. Sinus rhythm with first-degree AV block, RAD, no ischemic changes  Assessment and Plan: I have seen and evaluated the patient as outlined above. I agree with the formulated Assessment and Plan as detailed in the residents' note, with the following changes: [name] is a 54 year old gentleman with biventricular heart failure, chronic hypoxic respiratory failure, and end-stage renal disease on hemodialysis. He was admitted yesterday for acute on chronic hypoxic hypercapnic respiratory failure secondary to volume overload due to ESRD and biventricular heart failure. He has made improvements in oxygen requirements with 1 session of HD on the day of admission. Additional HD is planned for today.  1. Acute on chronic hypercapnic hypoxic respiratory failure secondary to HD nonadherence and biventricular failure - overall improvement in oxygen requirements but still in mild respiratory distress. For additional HD today. HD is complicated by hypotension during hemodialysis which is limiting renal's ability to lower his dry weight. - HD per renal - cont to wean oxygen - daily weights and I's and O's  2. Bilateral pleural effusions - he had a right thoracentesis on the day of admission with 400 mL removed. The etiology is his volume overload and will follow improvement with additional HD sessions and volume removal.  3. Macrocytosis with an elevated RDW -  the macrocytosis is new but the elevated RDW was chronic. None of his medications explain this. Would repeat vitamin B12 and TSH, and interpreting the TSH in the setting of an acute illness. Will need to ask about alcohol intake.  4. Hypothyroidism - I do not see this diagnosis on his problem list or past medical  history but he is on Synthroid. Continue Synthroid and check TSH but due to his acute illness, it may need to be repeated in the outpatient setting.  Other medical problems per Dr. Imagene Gurney daily note  Bartholomew Crews, MD 5/1/202110:59 AM

## 2020-04-14 NOTE — Progress Notes (Signed)
Notified per Central Telemetry of patient having a 7 beat run of nonsustained wide complex tachycardia. Patient denies any complaints of chest pain/discomfort. No overt signs of distress noted. Vital signs as noted in flow sheet documentation.

## 2020-04-15 LAB — CBC WITH DIFFERENTIAL/PLATELET
Abs Immature Granulocytes: 0.19 10*3/uL — ABNORMAL HIGH (ref 0.00–0.07)
Basophils Absolute: 0.1 10*3/uL (ref 0.0–0.1)
Basophils Relative: 1 %
Eosinophils Absolute: 0.1 10*3/uL (ref 0.0–0.5)
Eosinophils Relative: 1 %
HCT: 43.7 % (ref 39.0–52.0)
Hemoglobin: 12.8 g/dL — ABNORMAL LOW (ref 13.0–17.0)
Immature Granulocytes: 2 %
Lymphocytes Relative: 8 %
Lymphs Abs: 1 10*3/uL (ref 0.7–4.0)
MCH: 29.6 pg (ref 26.0–34.0)
MCHC: 29.3 g/dL — ABNORMAL LOW (ref 30.0–36.0)
MCV: 100.9 fL — ABNORMAL HIGH (ref 80.0–100.0)
Monocytes Absolute: 1.2 10*3/uL — ABNORMAL HIGH (ref 0.1–1.0)
Monocytes Relative: 10 %
Neutro Abs: 9.2 10*3/uL — ABNORMAL HIGH (ref 1.7–7.7)
Neutrophils Relative %: 78 %
Platelets: 196 10*3/uL (ref 150–400)
RBC: 4.33 MIL/uL (ref 4.22–5.81)
RDW: 25.6 % — ABNORMAL HIGH (ref 11.5–15.5)
WBC: 11.7 10*3/uL — ABNORMAL HIGH (ref 4.0–10.5)
nRBC: 0.6 % — ABNORMAL HIGH (ref 0.0–0.2)

## 2020-04-15 LAB — BASIC METABOLIC PANEL
Anion gap: 14 (ref 5–15)
BUN: 37 mg/dL — ABNORMAL HIGH (ref 6–20)
CO2: 28 mmol/L (ref 22–32)
Calcium: 9 mg/dL (ref 8.9–10.3)
Chloride: 93 mmol/L — ABNORMAL LOW (ref 98–111)
Creatinine, Ser: 4.73 mg/dL — ABNORMAL HIGH (ref 0.61–1.24)
GFR calc Af Amer: 15 mL/min — ABNORMAL LOW (ref >60)
GFR calc non Af Amer: 13 mL/min — ABNORMAL LOW (ref >60)
Glucose, Bld: 109 mg/dL — ABNORMAL HIGH (ref 70–99)
Potassium: 4.3 mmol/L (ref 3.5–5.1)
Sodium: 135 mmol/L (ref 135–145)

## 2020-04-15 LAB — HEPATITIS B CORE ANTIBODY, TOTAL: Hep B Core Total Ab: REACTIVE — AB

## 2020-04-15 LAB — HEPATITIS B SURFACE ANTIBODY,QUALITATIVE: Hep B S Ab: REACTIVE — AB

## 2020-04-15 LAB — VITAMIN B12: Vitamin B-12: 504 pg/mL (ref 180–914)

## 2020-04-15 LAB — TSH: TSH: 7.508 u[IU]/mL — ABNORMAL HIGH (ref 0.350–4.500)

## 2020-04-15 NOTE — Progress Notes (Addendum)
Subjective:  [name] [name] is a 54 year old man with a past medical history significant for for hypertension, HFrEF, chronic hypoxic respiratory failure, hemodialysis dependent end-stage renal disease, atrial fibrillation, and a recent prolonged hospitalization (11/2019-01/2020) who was admitted to IMTS on 04/13/2020 for acute hypoxic respiratory failure.   Mr. Henney was sitting on the edge of the bed, and in no apparent distress. Patient reports that he is feeling better today. He states that he still has orthopnea, but that it has decreased in severity since yesterday. Patient had an episode of nonsustained wide complex tachycardia last night. Patient denies any chest pain or palpitations.  Patient has been reported to refuse lab draw and wound care. We discussed the importance of these interventions to the patient, to which he agreed to labs and wound care.   Objective:  Vital signs in last 24 hours: Vitals:   04/14/20 2120 04/15/20 0509 04/15/20 0600 04/15/20 0822  BP: 118/87 (!) 149/98 123/74 135/85  Pulse: 86 73  76  Resp: 20 16 16 16   Temp: 97.9 F (36.6 C) 97.8 F (36.6 C)  97.6 F (36.4 C)  TempSrc: Oral Oral  Oral  SpO2: 99% 93% 100% 94%  Weight:  66.8 kg     Weight change: -0.012 kg  Intake/Output Summary (Last 24 hours) at 04/15/2020 0859 Last data filed at 04/15/2020 0759 Gross per 24 hour  Intake 480 ml  Output 3116 ml  Net -2636 ml   Physical Exam: General:Well developed, chronically ill appearing male seated on bedside Head:Normocephalic, atraumatic, Respiratory:On O2 4L nasal canula, able to speak in short sentences. Mildly diminished right-sided breath sounds, left-sided lung sounds appreciated with lung crackles heard over left base. Abdomen: Mildly distended, non-tender to palpation. Normoactive bowel sounds. Bandage over mid-epigastric region. Cardiovascular: S1 normal, S2 normal. RRR. 2+ pitting edema in lower extremities, bilaterally. Neuro: Alert.  Moves all extremities spontaneously. Psych: Responds to questions appropriately with a normal affect. Skin: Right-sided chest puncture from thoracentesis. No active bleeding.  Assessment/Plan:  Active Problems:   Acute respiratory failure ([place])  # Acute Respiratory Failure # Bilateral Pleural Effusions Daltyn Degroat is a 54 year old male with a past medical history significant for hypertension, congestive heart failure, previous cardiac arrest requiring extended hospitalization (11/2019-01/2020), chronic hypoxic respiratory failure, pulmonary embolism, end-stage renal disease on dialysis, prolonged QT interval, right wrist fracture, hypocalcemia, and atrial fibrillation on Eliquis who presented to the emergency department with shortness of breath. Patient is on 4L oxygen supplementation via nasal canula. On 04/13/2020 patient underwent right thoracentesis that removed 400 mL and hemodialysis. Net input output on 04/13/2020 was  -3.2 L. On 04/14/2020, patient underwent hemodialysis. Net input/output on 04/14/2020 was -3.1 L. This episode of acute on chronic acute respiratory failure is likely secondary to volume overload. Patient seems to be improving with fluid removal interventions. However, patient is still requiring additional supplemental oxygen above baseline. We will continue to attempt to wean oxygen to baseline levels. Plan: - Monitor SpO2 with pulse oximetry - Maintain O2 saturation greater than 90% - Continue to wean supplemental O2 to baseline level of 2L - Fluid restriction - Continuous cardiac monitoring - Daily Weights - Monitor Intake and Output  # End-stage Renal Disease Patient has undergone two rounds of hemodialysis since admission. Plan: - Management per nephrology.  - Comprehensive metabolic panel to monitor electrolytes and BUN/creatinine. - Continue calcitriol and calcium acetate - Continue midodrine  # HFrEF, EF 30-35% Plan: - Telemetry monitoring - Fluid  restriction - Daily weights - Strict I/Os   # Atrial fibrillation Patient has history of atrial fibrillation with outpatient Eliquis for clotting prevention. Plan: - Continue Eliquis for clot prevention - Continue amiodarone 200 mg daily  # Anemia Patient's initial labs reported Hgb 11.1, but has since increased to 16.3. Macrocytosis with elevated RDW observed from labs. Patient denies alcohol use.  Plan: - CBC with differential to monitor hemoglobin. - Check B12 levels.  # Bilateral foot wounds Patient reports bilateral foot wounds that occasionally drain. [name] maintains wound care with dressings. Patient had history of refusing wound care. After discussion this morning, he has agreed to allow for inpatient wound care. Plan: - Wound care consulted. - Continue amitriptyline 10 mg + pregabalin 50 mg at bedtime for chronic pain  # History of Cardiac Arrest Patient has a history of cardiac arrest that resulted in prolonged hospital admission. Troponin were elevated on admission but without significant delta. Patient without chest pain or significant signs of acute ischemia on ECG. Elevation is likely secondary to volume overload.  Plan: - If patient develops symptoms concerning for acute coronary syndrome, will repeat troponins and ECG.  # History of pericardial effusion:  Plan: - Continue home colchicine - Continue home prednisone. Will see if this needs to be continued.  # History of EPS symptoms: Previously on seroquel, developed EPS symptoms, started on benztropine. Plan: - Continue benztropine  # Hypothyroidism: Continue home levothyroxine 25 mcg daily. Last TSH elevated at 6.9 in January 2021. Plan: - Recheck TSH - Continue synthroid   # Nutrition Patient has a extensive past medical history of chronic illness and recent prolonged hospitalization. Would likely benefit from improving appetite/weight following his recent discharge from previous hospital  admission. Plan: - Management per nutrition consult.  DVT prophylaxis: On therapeutic anticoagulation with eliquis Code Status: Full Dispo: Admit patient to Inpatient with expected length of stay greater than 2 midnights.    LOS: 2 days   Nanetta Batty, Medical Student 04/15/2020, 8:59 AM

## 2020-04-15 NOTE — Progress Notes (Signed)
Patient ID: [name] [name], male   DOB: 07-Jul-1966, 54 y.o.   MRN: [id number] [facility] Progress Note   Assessment/ Plan:   1.  Shortness of breath: This is secondary to combination of pulmonary edema and pleural effusions; improved overnight with hemodialysis and right-sided thoracentesis.  Continues to require supplemental oxygenation at higher rate than usual outpatient 2 L/minute via [place]; unclear if needs additional left-sided thoracentesis. 2.  ESRD:  He is usually on a TTS dialysis schedule and underwent his scheduled hemodialysis yesterday after emergent dialysis on Friday-net ultrafiltration to date 6.3 L with 0.4 L thoracentesis from right side.  Oxygenation requirements improving.   If still here in the hospital, will order for dialysis again tomorrow [place] expresses hesitation of this). 3.  Hypertension/volume:  He is hypervolemic and the effort will continue for efforts at trying to unload volume while limiting intradialytic hypotension. 4.  Anemia:  Hemoglobin and hematocrit currently at goal without ESA.  5.  Metabolic bone disease:  Continue calcium acetate for phosphorus binding and calcitriol for PTH control. 6. Nutrition:   Improving appetite/weight following his prolonged discharge from the hospital, reevaluate EDW.   Subjective:   He does not recall that he had dialysis yesterday-reports that his breathing is somewhat better.   Objective:   BP 123/74 (BP Location: Right Leg)   Pulse 73   Temp 97.8 F (36.6 C) (Oral)   Resp 16   Wt 66.8 kg   SpO2 100%   BMI 21.75 kg/m   Physical Exam: Gen: Appears chronically ill, leaning forward with short shallow breaths. CVS: Pulse regular rhythm, normal rate, S1 and S2 normal Resp: Diminished breath sounds over bases with fine rales right base. Abd: Soft, obese, nontender Ext: 2+ bilateral pitting lower extremity edema  Labs: BMET Recent Labs  Lab 04/13/20 1044 04/13/20 1225  NA 140 137  K 4.3 4.3  CL  93*  --   CO2 31  --   GLUCOSE 118*  --   BUN 36*  --   CREATININE 5.81*  --   CALCIUM 8.7*  --    CBC Recent Labs  Lab 04/13/20 1044 04/13/20 1225  WBC 11.1*  --   NEUTROABS 8.8*  --   HGB 12.7* 16.3  HCT 44.9 48.0  MCV 102.7*  --   PLT 221  --       Medications:    . amiodarone  200 mg Oral Daily  . amitriptyline  10 mg Oral QHS  . apixaban  2.5 mg Oral BID  . benztropine  1 mg Oral BID  . Chlorhexidine Gluconate Cloth  6 each Topical Daily  . dextromethorphan-guaiFENesin  1 tablet Oral BID  . docusate sodium  100 mg Oral Daily  . feeding supplement (NEPRO CARB STEADY)  237 mL Oral BID BM  . feeding supplement (PRO-STAT SUGAR FREE 64)  30 mL Oral BID  . folic acid  1 mg Oral Daily  . levothyroxine  25 mcg Oral QAC breakfast  . midodrine  10 mg Oral TID WC  . multivitamin  1 tablet Oral QHS  . mupirocin ointment  1 application Nasal BID  . predniSONE  10 mg Oral Q breakfast  . pregabalin  50 mg Oral QHS  . sodium chloride flush  3 mL Intravenous Once  . thiamine  100 mg Oral Daily  . vitamin C  250 mg Oral BID   Elmarie Shiley, MD 04/15/2020, 8:08 AM

## 2020-04-15 NOTE — Progress Notes (Signed)
Pt refused IV insertion

## 2020-04-16 ENCOUNTER — Encounter (HOSPITAL_BASED_OUTPATIENT_CLINIC_OR_DEPARTMENT_OTHER): Payer: Medicare Other | Admitting: Internal Medicine

## 2020-04-16 LAB — CBC
HCT: 45.3 % (ref 39.0–52.0)
Hemoglobin: 13.1 g/dL (ref 13.0–17.0)
MCH: 29.4 pg (ref 26.0–34.0)
MCHC: 28.9 g/dL — ABNORMAL LOW (ref 30.0–36.0)
MCV: 101.6 fL — ABNORMAL HIGH (ref 80.0–100.0)
Platelets: 198 10*3/uL (ref 150–400)
RBC: 4.46 MIL/uL (ref 4.22–5.81)
RDW: 25.2 % — ABNORMAL HIGH (ref 11.5–15.5)
WBC: 12.1 10*3/uL — ABNORMAL HIGH (ref 4.0–10.5)
nRBC: 0.7 % — ABNORMAL HIGH (ref 0.0–0.2)

## 2020-04-16 LAB — BASIC METABOLIC PANEL
Anion gap: 16 — ABNORMAL HIGH (ref 5–15)
BUN: 66 mg/dL — ABNORMAL HIGH (ref 6–20)
CO2: 27 mmol/L (ref 22–32)
Calcium: 9 mg/dL (ref 8.9–10.3)
Chloride: 92 mmol/L — ABNORMAL LOW (ref 98–111)
Creatinine, Ser: 6.31 mg/dL — ABNORMAL HIGH (ref 0.61–1.24)
GFR calc Af Amer: 11 mL/min — ABNORMAL LOW (ref >60)
GFR calc non Af Amer: 9 mL/min — ABNORMAL LOW (ref >60)
Glucose, Bld: 90 mg/dL (ref 70–99)
Potassium: 4.9 mmol/L (ref 3.5–5.1)
Sodium: 135 mmol/L (ref 135–145)

## 2020-04-16 MED ORDER — POVIDONE-IODINE 10 % EX SOLN
Freq: Two times a day (BID) | CUTANEOUS | Status: DC
  Administered 2020-04-18: 1 via TOPICAL
  Filled 2020-04-16 (×2): qty 118

## 2020-04-16 NOTE — Progress Notes (Signed)
Pt noted very agitated and restless at this time. Pt requesting to speak with his parents. Pt informed that he was in the hospital and his parents are not here. This nurse attempted to assist pt with making a phone call on his cell phone, however pulled arm away and yelled "leave me alone, get me out of here!" Pt became increasingly agitated and began pulling himself down to lower end of bed. This nurse attempted to reorient patient and pt continues to yell "I don't know what is going on but I'm getting out of here!" This nurse informed nurse that she would take patient off but she wanted to let the doctor know first and requested that patient wait a few minutes, pt compliant and laid back on bed. As this nurse walked away to speak with MD, pt began pulling at tele leads and has again thrown legs over siderail. This nurse returned to bedside and called for assistance from staff. Pt repositioned back into the bed, pt continues yelling "what is going on lady, I got to get out of here!" Pt then grabbed blood lines, this nurse grabbed lines as well and held to stabilize. Pt reassured that this nurse would discontinue tx, if pt allowed the gathering of supplies. Pt agreed to lie still while this nurse removed needles. Dr.Schertz notified and agreed to terminate tx at this time. Post tx-pt allowed vital signs, however refused temp. Check. Again becoming agitated, yelling at this writer, "I don't understand this, am I dying, what is the problem?" Attempts at reorientation unsuccessful. Dr. Jonnie Finner again at bedside with attempts at reorientation, pt calmer however continues to ask "am I going to die?" Report given to primary nurse, pt transferred back to room.

## 2020-04-16 NOTE — Consult Note (Signed)
[facility] Nurse Consult Note: Patient receiving care in [place].  Assisted with wound assessment by primary RN. 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 Pressure Injury POA: Yes/No/NA Measurement: All toes and forefeet bilaterally with black eschar, yellow, pink wound beds. Wound bed: Drainage (amount, consistency, odor) none Periwound:intact Dressing procedure/placement/frequency: I have continued the therapy ordered by the outpatient [place] or betadine moistened gauze, dry gauze, secure with roll gauze; to be performed twice daily. Monitor the wound area(s) for worsening of condition such as: Signs/symptoms of infection,  Increase in size,  Development of or worsening of odor, Development of pain, or increased pain at the affected locations.  Notify the medical team if any of these develop.  Thank you for the consult.  Discussed plan of care with the patient and bedside nurse.  [facility] nurse will not follow at this time.  Please re-consult the [facility] team if needed.  Val Riles, RN, MSN, CWOCN, CNS-BC, pager [phone]

## 2020-04-16 NOTE — Plan of Care (Signed)

## 2020-04-16 NOTE — Progress Notes (Addendum)
Patient ID: [name] [name], male   DOB: Aug 29, 1966, 54 y.o.   MRN: [id number] [facility] Progress Note   Assessment/ Plan:   1.  Shortness of breath: This is secondary to combination of pulmonary edema and pleural effusions; improved with hemodialysis x 2 and right-sided thoracentesis.  Continues to require supplemental oxygenation at higher rate than usual outpatient 2 L/minute via [place]; unclear if needs additional left-sided thoracentesis. 2.  ESRD:  He is usually on a TTS dialysis schedule and underwent his scheduled hemodialysis yesterday after emergent dialysis on Friday-net ultrafiltration to date 6.3 L with 0.4 L thoracentesis from right side.  Oxygenation requirements improving. Getting HD today off schedule.  Only got about 50% of HD today due to hypotension and AMS then followed by agitation so we had to stop the session. No HD tomorrow.  3.  Hypertension/volume: did not tolerate any UF on HD this am. Has some ankle edema still. Up 3-4 kg by wt's. HD x 2 here w/ 3 L off each time.  4.  Anemia:  Hemoglobin and hematocrit currently at goal without ESA.  5.  Metabolic bone disease:  Continue calcium acetate for phosphorus binding and calcitriol for PTH control. 6. Nutrition:   Improving appetite/weight following his prolonged discharge from the hospital, reevaluate EDW.    Kelly Splinter, MD 04/16/2020, 4:12 PM    Subjective:   Pt seen on HD, was hypotensive and poorly responsive. Improved some w/ cutting UF Off and giving saline but then became agitated and we had to stop early.    Objective:   BP (!) 126/48   Pulse (!) 55   Temp 98 F (36.7 C) (Axillary)   Resp 16   Wt 65.2 kg   SpO2 97%   BMI 21.23 kg/m    OP HD: [place] [facility]  4h 5min  61.5kg  450/1.5  P4  2/2 bath  AVF 15g  Hep 5000   - Calcitriol 0.50mcg PO q HD   - Calcium acetate 3 tab TID   Physical Exam: Gen: Appears chronically ill, nasal O2, no distress CVS: Pulse regular rhythm, normal rate, S1 and S2  normal Resp: Diminished breath sounds over bases with fine rales right base. Abd: Soft, obese, nontender Ext: 1+ bilat ankle edema, no hip or dependent edema Neuro: confused and stuporous on HD this am, more alert at the time of writing this note  Labs: BMET Recent Labs  Lab 04/13/20 1044 04/13/20 1225 04/15/20 1018 04/16/20 0615  NA 140 137 135 135  K 4.3 4.3 4.3 4.9  CL 93*  --  93* 92*  CO2 31  --  28 27  GLUCOSE 118*  --  109* 90  BUN 36*  --  37* 66*  CREATININE 5.81*  --  4.73* 6.31*  CALCIUM 8.7*  --  9.0 9.0   CBC Recent Labs  Lab 04/13/20 1044 04/13/20 1225 04/15/20 1018 04/16/20 0615  WBC 11.1*  --  11.7* 12.1*  NEUTROABS 8.8*  --  9.2*  --   HGB 12.7* 16.3 12.8* 13.1  HCT 44.9 48.0 43.7 45.3  MCV 102.7*  --  100.9* 101.6*  PLT 221  --  196 198      Medications:    . amiodarone  200 mg Oral Daily  . amitriptyline  10 mg Oral QHS  . apixaban  2.5 mg Oral BID  . benztropine  1 mg Oral BID  . Chlorhexidine Gluconate Cloth  6 each Topical Daily  . dextromethorphan-guaiFENesin  1 tablet Oral BID  . docusate sodium  100 mg Oral Daily  . feeding supplement (NEPRO CARB STEADY)  237 mL Oral BID BM  . feeding supplement (PRO-STAT SUGAR FREE 64)  30 mL Oral BID  . folic acid  1 mg Oral Daily  . levothyroxine  25 mcg Oral QAC breakfast  . midodrine  10 mg Oral TID WC  . multivitamin  1 tablet Oral QHS  . mupirocin ointment  1 application Nasal BID  . povidone-iodine   Topical BID  . predniSONE  10 mg Oral Q breakfast  . pregabalin  50 mg Oral QHS  . sodium chloride flush  3 mL Intravenous Once  . thiamine  100 mg Oral Daily  . vitamin C  250 mg Oral BID

## 2020-04-16 NOTE — Progress Notes (Signed)
Patient refuses CHG wipes for MRSA history. Patient has been non compliant all night and verbally aggressive. Patient has removed cast from right arm.

## 2020-04-16 NOTE — Plan of Care (Signed)

## 2020-04-16 NOTE — Progress Notes (Signed)
Subjective: [name] [name] is a 54 year old man with a past medical history significant for for hypertension, HFrEF, chronic hypoxic respiratory failure, hemodialysis dependent end-stage renal disease, atrial fibrillation, and a recent prolonged hospitalization (11/2019-01/2020) who was admitted to IMTS on 04/13/2020 for acute hypoxic respiratory failure.   [name] [name] was evaluated and examined this morning with his father present bedside. Patient was sitting upright at the edge of the bed this morning. Patient states that he slept well last night, and has not been experiencing any orthopnea. He denies any dyspnea. Patient is currently on 4 L nasal canula supplemental oxygen, up from his 2 L baseline prior to admission.  Patient reports that his feet are painful following wound care this morning, but that there have been no changes from baseline.  Objective:  Vital signs in last 24 hours: Vitals:   04/15/20 2012 04/15/20 2340 04/16/20 0400 04/16/20 0444  BP: 135/71 132/77  112/78  Pulse: 66 66  65  Resp:  18  20  Temp: 97.9 F (36.6 C) 98.2 F (36.8 C)  97.9 F (36.6 C)  TempSrc: Axillary Oral  Oral  SpO2: 96% 97%  98%  Weight:   66.4 kg    Weight change: -1.939 kg  Intake/Output Summary (Last 24 hours) at 04/16/2020 1037 Last data filed at 04/16/2020 0855 Gross per 24 hour  Intake 460 ml  Output 0 ml  Net 460 ml   General:Well developed, chronically ill appearing male seated on bedside Head:Normocephalic, atraumatic, Respiratory:On O2 4Lnasal canula,able to speak in short sentences. Mildly diminished right-sided breath sounds throughout. Abdomen:Mildly distended, non-tender to palpation. Normoactive bowel sounds. Bandage over mid-epigastric region. Cardiovascular:S1 normal, S2 normal. RRR. Neuro: Alert. Moves all extremities spontaneously. Psych: Responds to questions appropriately with a normal affect.   Assessment/Plan:  Active Problems:   Acute respiratory  failure (HCC)  #Acute Respiratory Failure # Bilateral Pleural Effusions [name] [name] is a 54 year old male with a pastmedical history significant for hypertension, congestive heart failure, previous cardiac arrest requiring extended hospitalization (11/2019-01/2020), chronic hypoxic respiratory failure, pulmonary embolism, end-stage renal disease on dialysis, prolonged QT interval, right wrist fracture, hypocalcemia, and atrial fibrillation on Eliquis who presented to the emergency department with shortness of breath.Patient is on 4L oxygen supplementation via nasal canula. On 04/13/2020 patient underwent right thoracentesis that removed 400 mL and hemodialysis. Net input/ouput since admission is -5.2 L. This episode of acute on chronic acute respiratory failure is likely secondary to volume overload. Patient seems to be improving with fluid removal interventions. However, patient is still requiring additional supplemental oxygen above baseline. We will continue to attempt to wean oxygen to baseline levels. Plan: - Monitor SpO2 with pulse oximetry - Maintain O2 saturation greater than 90% - Continue to wean supplemental O2 to baseline level of 2L - Fluid restriction - Continuous cardiac monitoring - Daily Weights - Monitor Intake and Output  #End-stage Renal Disease Patient has undergone two rounds of hemodialysis since admission. Plan: - Management per nephrology. - Plan for dialysis today with goals towards discharge following dialysis session. - Continue calcitriol and calcium acetate - Continue midodrine  # HFrEF, EF 30-35% Plan: - Telemetry monitoring - Fluid restriction - Daily weights - Strict I/Os   #Atrial fibrillation Patient has history of atrial fibrillation with outpatient Eliquis for clotting prevention. Plan: - Continue Eliquis for clot prevention - Continue amiodarone 200 mg daily   #Bilateral foot wounds Patient reports bilateral foot wounds that  occasionally drain. [name] maintains wound  care with dressings. Patient had history of refusing wound care. After discussion this morning, he has agreed to allow for inpatient wound care. Plan: - Continue to allow father to change dressings. - Continue amitriptyline 10 mg + pregabalin 50 mg at bedtime for chronic pain  # History of pericardial effusion:  Plan: - Continue home colchicine - Continue home prednisone.  # History of EPS symptoms: Previously on seroquel, developed EPS symptoms, started on benztropine. Plan: - Continue benztropine  # Hypothyroidism: Continue home levothyroxine 25 mcg daily. TSH obtained yesterday and elevated at 7.508. Plan: - Recheck TSH outpatient. - Continue synthroid   # Nutrition Patient has a extensive past medical history of chronic illness and recent prolonged hospitalization. Would likely benefit from improving appetite/weight following his recent discharge from previous hospital admission. Plan: - Management per nutrition consult.  DVT prophylaxis:On therapeutic anticoagulation with eliquis Code Status: Full Dispo: Admit patient toInpatient with expected length of stay greater than 2 midnights.    LOS: 3 days   Nanetta Batty, Medical Student 04/16/2020, 10:37 AM

## 2020-04-16 NOTE — Consult Note (Signed)
   [facility] Inpatient Consult   04/16/2020  [name] [name] 01-03-1966 [id number]   Patient screened for extreme high risk score for unplanned readmission score  and for hospitalizations to check if potential [place].  Review of patient's medical record reveals patient is in the Embedded practice with [facility]  Primary Care Provider is Jean Rosenthal, MD, Residency program.  This office provides for the Transition of Care [TOC] follow up and the practice could provide chronic care management with the Embedded team. Plan:  Follow up with inpatient [facility] team for needs and alert of the Embedded practice patient. Continue to follow progress and disposition to assess for follow up with the Embedded team members as needed.    Please place a [facility] Care Management consult as appropriate and for questions contact:   Natividad Brood, RN BSN [facility] Liaison  [phone] business mobile phone Toll free office [phone]  Fax number: [phone] [email or web address]

## 2020-04-16 NOTE — Progress Notes (Signed)
Orthopedic Tech Progress Note Patient Details:  [name] [name] [birth year] [id number]  Patient ID: [name] [name], male   DOB: [birth year], 54 y.o.   MRN: [id number] rn called for me to check splint. Splint was on right.  Karolee Stamps 04/16/2020, 9:03 AM

## 2020-04-17 ENCOUNTER — Inpatient Hospital Stay (HOSPITAL_COMMUNITY): Payer: Medicare Other

## 2020-04-17 DIAGNOSIS — I5082 Biventricular heart failure: Secondary | ICD-10-CM | POA: Diagnosis not present

## 2020-04-17 DIAGNOSIS — J9620 Acute and chronic respiratory failure, unspecified whether with hypoxia or hypercapnia: Secondary | ICD-10-CM

## 2020-04-17 DIAGNOSIS — J90 Pleural effusion, not elsewhere classified: Secondary | ICD-10-CM | POA: Diagnosis not present

## 2020-04-17 DIAGNOSIS — J9621 Acute and chronic respiratory failure with hypoxia: Secondary | ICD-10-CM | POA: Diagnosis not present

## 2020-04-17 DIAGNOSIS — N186 End stage renal disease: Secondary | ICD-10-CM | POA: Diagnosis not present

## 2020-04-17 DIAGNOSIS — J9622 Acute and chronic respiratory failure with hypercapnia: Secondary | ICD-10-CM

## 2020-04-17 LAB — RENAL FUNCTION PANEL
Albumin: 3.1 g/dL — ABNORMAL LOW (ref 3.5–5.0)
Albumin: 3.4 g/dL — ABNORMAL LOW (ref 3.5–5.0)
Anion gap: 15 (ref 5–15)
Anion gap: 15 (ref 5–15)
BUN: 72 mg/dL — ABNORMAL HIGH (ref 6–20)
BUN: 59 mg/dL — ABNORMAL HIGH (ref 6–20)
CO2: 24 mmol/L (ref 22–32)
CO2: 27 mmol/L (ref 22–32)
Calcium: 8.4 mg/dL — ABNORMAL LOW (ref 8.9–10.3)
Calcium: 8.1 mg/dL — ABNORMAL LOW (ref 8.9–10.3)
Chloride: 93 mmol/L — ABNORMAL LOW (ref 98–111)
Chloride: 92 mmol/L — ABNORMAL LOW (ref 98–111)
Creatinine, Ser: 5.75 mg/dL — ABNORMAL HIGH (ref 0.61–1.24)
Creatinine, Ser: 5.18 mg/dL — ABNORMAL HIGH (ref 0.61–1.24)
GFR calc Af Amer: 12 mL/min — ABNORMAL LOW (ref >60)
GFR calc Af Amer: 14 mL/min — ABNORMAL LOW (ref >60)
GFR calc non Af Amer: 10 mL/min — ABNORMAL LOW (ref >60)
GFR calc non Af Amer: 12 mL/min — ABNORMAL LOW (ref >60)
Glucose, Bld: 115 mg/dL — ABNORMAL HIGH (ref 70–99)
Glucose, Bld: 100 mg/dL — ABNORMAL HIGH (ref 70–99)
Phosphorus: 7.5 mg/dL — ABNORMAL HIGH (ref 2.5–4.6)
Phosphorus: 6.7 mg/dL — ABNORMAL HIGH (ref 2.5–4.6)
Potassium: 4.5 mmol/L (ref 3.5–5.1)
Potassium: 4.1 mmol/L (ref 3.5–5.1)
Sodium: 132 mmol/L — ABNORMAL LOW (ref 135–145)
Sodium: 134 mmol/L — ABNORMAL LOW (ref 135–145)

## 2020-04-17 LAB — ECHOCARDIOGRAM LIMITED: Weight: 2374.4 oz

## 2020-04-17 LAB — MAGNESIUM
Magnesium: 2.2 mg/dL (ref 1.7–2.4)
Magnesium: 2.4 mg/dL (ref 1.7–2.4)

## 2020-04-17 LAB — CBC
HCT: 39.1 % (ref 39.0–52.0)
Hemoglobin: 11.4 g/dL — ABNORMAL LOW (ref 13.0–17.0)
MCH: 29.5 pg (ref 26.0–34.0)
MCHC: 29.2 g/dL — ABNORMAL LOW (ref 30.0–36.0)
MCV: 101 fL — ABNORMAL HIGH (ref 80.0–100.0)
Platelets: 192 10*3/uL (ref 150–400)
RBC: 3.87 MIL/uL — ABNORMAL LOW (ref 4.22–5.81)
RDW: 24.7 % — ABNORMAL HIGH (ref 11.5–15.5)
WBC: 10.4 10*3/uL (ref 4.0–10.5)
nRBC: 0.5 % — ABNORMAL HIGH (ref 0.0–0.2)

## 2020-04-17 LAB — TROPONIN I (HIGH SENSITIVITY)
Troponin I (High Sensitivity): 186 ng/L (ref <18)
Troponin I (High Sensitivity): 201 ng/L (ref <18)

## 2020-04-17 LAB — GLUCOSE, CAPILLARY: Glucose-Capillary: 131 mg/dL — ABNORMAL HIGH (ref 70–99)

## 2020-04-17 LAB — MRSA PCR SCREENING: MRSA by PCR: POSITIVE — AB

## 2020-04-17 MED ORDER — AMIODARONE HCL 200 MG PO TABS
200.0000 mg | ORAL_TABLET | Freq: Two times a day (BID) | ORAL | Status: DC
  Administered 2020-04-18 – 2020-04-19 (×3): 200 mg via ORAL
  Filled 2020-04-17 (×3): qty 1

## 2020-04-17 MED ORDER — HEPARIN SODIUM (PORCINE) 1000 UNIT/ML DIALYSIS: 2000.0000 [IU] | INTRAMUSCULAR | Status: DC | PRN

## 2020-04-17 MED ORDER — CALCIUM GLUCONATE-NACL 1-0.675 GM/50ML-% IV SOLN
1.0000 g | Freq: Once | INTRAVENOUS | Status: AC
  Administered 2020-04-17: 1000 mg via INTRAVENOUS
  Filled 2020-04-17: qty 50

## 2020-04-17 MED ORDER — CHLORHEXIDINE GLUCONATE CLOTH 2 % EX PADS: 6.0000 | MEDICATED_PAD | Freq: Every day | CUTANEOUS | Status: DC

## 2020-04-17 MED ORDER — SODIUM BICARBONATE-DEXTROSE 150-5 MEQ/L-% IV SOLN
150.0000 meq | INTRAVENOUS | Status: DC
  Filled 2020-04-17: qty 1000

## 2020-04-17 MED ORDER — ORAL CARE MOUTH RINSE
15.0000 mL | Freq: Two times a day (BID) | OROMUCOSAL | Status: DC
  Administered 2020-04-17 – 2020-04-19 (×2): 15 mL via OROMUCOSAL

## 2020-04-17 MED ORDER — LIDOCAINE HCL (CARDIAC) PF 100 MG/5ML IV SOSY
1.0000 mg/kg | PREFILLED_SYRINGE | Freq: Once | INTRAVENOUS | Status: AC
  Administered 2020-04-17: 70.8 mg via INTRAVENOUS

## 2020-04-17 MED ORDER — PRO-STAT SUGAR FREE PO LIQD
30.0000 mL | Freq: Three times a day (TID) | ORAL | Status: DC
  Administered 2020-04-18 (×2): 30 mL via ORAL
  Filled 2020-04-17 (×3): qty 30

## 2020-04-17 MED ORDER — LIDOCAINE HCL (CARDIAC) PF 100 MG/5ML IV SOSY
PREFILLED_SYRINGE | INTRAVENOUS | Status: AC
  Filled 2020-04-17: qty 5

## 2020-04-17 MED ORDER — METOPROLOL TARTRATE 5 MG/5ML IV SOLN: 5.0000 mg | Freq: Once | INTRAVENOUS | Status: DC

## 2020-04-17 MED ORDER — CALCITRIOL 0.5 MCG PO CAPS
0.7500 ug | ORAL_CAPSULE | ORAL | Status: DC
  Administered 2020-04-19: 0.75 ug via ORAL
  Filled 2020-04-17: qty 1

## 2020-04-17 MED ORDER — MAGNESIUM SULFATE 2 GM/50ML IV SOLN
2.0000 g | Freq: Once | INTRAVENOUS | Status: AC
  Administered 2020-04-17: 2 g via INTRAVENOUS
  Filled 2020-04-17: qty 50

## 2020-04-17 MED ORDER — CHLORHEXIDINE GLUCONATE CLOTH 2 % EX PADS
6.0000 | MEDICATED_PAD | Freq: Every day | CUTANEOUS | Status: DC
  Administered 2020-04-18: 6 via TOPICAL

## 2020-04-17 MED ORDER — SODIUM CHLORIDE 0.9 % IV SOLN
250.0000 mL | INTRAVENOUS | Status: DC
  Administered 2020-04-17 – 2020-04-19 (×2): 250 mL via INTRAVENOUS

## 2020-04-17 MED ORDER — AMIODARONE IV BOLUS ONLY 150 MG/100ML
150.0000 mg | Freq: Once | INTRAVENOUS | Status: AC
  Administered 2020-04-17: 150 mg via INTRAVENOUS
  Filled 2020-04-17: qty 100

## 2020-04-17 MED ORDER — METOPROLOL TARTRATE 5 MG/5ML IV SOLN
2.5000 mg | Freq: Four times a day (QID) | INTRAVENOUS | Status: DC
  Administered 2020-04-17 – 2020-04-19 (×7): 2.5 mg via INTRAVENOUS
  Filled 2020-04-17 (×7): qty 5

## 2020-04-17 MED ORDER — AMIODARONE HCL IN DEXTROSE 360-4.14 MG/200ML-% IV SOLN
INTRAVENOUS | Status: AC
  Filled 2020-04-17: qty 200

## 2020-04-17 MED ORDER — NOREPINEPHRINE 4 MG/250ML-% IV SOLN: 2.0000 ug/min | INTRAVENOUS | Status: DC

## 2020-04-17 MED ORDER — CALCIUM ACETATE (PHOS BINDER) 667 MG PO CAPS
1334.0000 mg | ORAL_CAPSULE | Freq: Three times a day (TID) | ORAL | Status: DC
  Administered 2020-04-18 – 2020-04-19 (×3): 1334 mg via ORAL
  Filled 2020-04-17 (×6): qty 2

## 2020-04-17 NOTE — Progress Notes (Signed)
  Echocardiogram 2D Echocardiogram has been performed.  Jennette Dubin 04/17/2020, 10:33 AM

## 2020-04-17 NOTE — Progress Notes (Signed)
Paged to bedside for runs of NSVT:  Patient with nonischemic cardiomyopathy, biventricular failure, chronic A. Fib (on amiodarone and Eliquis), COPD, end-stage renal disease, cocaine use disorder and anemia who is in the hospital for shortness of breath and frequent nonsustained VT.  Apparently in the evening today while receiving HD had multiple runs of nonsustained ventricular tachycardia.  He has been treated with 2 g of magnesium sulfate and IV lidocaine.  -Potassium 4.1, magnesium 2.4 -Last QTc 485 -Levophed listed in EMR, however patient is not receiving it.  Not on any pressors -Currently on amiodarone and lidocaine  PLAN: Discontinue any QT prolonging medications Consider use of either IV metoprolol or IV esmolol to blunt any adrenergic stimulation for VT Patient is already tachycardic so no current role for overdrive pacing or isoproterenol Continue Amiodarone   [name] Mcgowen Laymond Purser, [name] [name], [facility] Overnight cardiology coverage

## 2020-04-17 NOTE — Hospital Course (Signed)
5/4 am-13 beat run of Vtach; CXR 5/3-planning for discharge but became hypotensive and aggitated in HD so remained hospitalized

## 2020-04-17 NOTE — Progress Notes (Signed)
Telephone call to Dr. Johnney Ou Nephrology informed of pt status poorly tolerating HD with freq, V-tach, goal decreased to 1564ml drew stat lab mg as requested. Dr. Johnney Ou states to cont HD to clean blood and remove fluid as tolerated unless pt becomes unstable not tolerating HD with intervention.

## 2020-04-17 NOTE — Progress Notes (Addendum)
PCCM Interval Note  Paged to bedside to evaluate patient with NSVT. PCCM Md has ordered 2g Mag, and Lidocaine due to prolonged qtc.  Pt is chronically ill, ESRD hx cardiac arrest, NICM with biventricular failure, substance abuse. He is actively receiving iHD.   Cardiology has been paged Nephrology has been paged    NSVT P -awaiting return from cardiology and nephrology.  -Long term, pt with very poor prognosis.  -Is not able to tolerate HD well outpatient (admitted acutely with hypoxia due to volume overload) and is not tolerating well inpatient with rhythm changes. D/w nephrology, if pt does not improve with metop per cards plan, low threshold to cease HD due to instability -Appreciate cardiology re-evaluating patient with further recs re NSVT  -BMP, mag phos are pending  -Add on ionized calcium -After discussion with cardiology, will try low dose metop 2.5mg . If tolerates well from BP perspective and if beneficial, can try this q6hr.  -If does not tolerate, reach back out to Dr. Doylene Canard; we are exhausting our options -I will call family to update on patient's poor tolerance and limited options   Eliseo Gum MSN, AGACNP-BC [place]  [phone] 04/17/2020, 10:05 PM

## 2020-04-17 NOTE — Progress Notes (Signed)
Family paged the nurses station concerning the patient's clammy skin. AD responded, assessed and notified rapid response and teaching services. Unable to retrieve ABG from RT and applied BIPAP on patient. Transfer order for patient to [id number]. Rapid response nurse assist with transfer and report given to Silva Bandy, RN on 63M.

## 2020-04-17 NOTE — Consult Note (Signed)
Referring Physician: Ezzie Dural, MD/Dr. Kyshon Tolliver is an 54 y.o. male.                       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  His chest x-ray is positive for recurrent pleural effusion.  His limited echocardiogram today shows biventricular failure with LV EF of 25 to 30 % and generalized LV and RV hypokinesia. He is not a candidate for ICD due to chronic wound infections and lack of optimal heart failure treatment.  His EKG shows sinus rhythm, 1st degree AV block, RBBB, Possible anterior and inferior infarcts.  Past Medical History:  Diagnosis Date  . Abdominal distension 08/10/2018  . Acute on chronic respiratory failure with hypoxia ([place])   . Acute on chronic systolic and diastolic heart failure, NYHA class 4 ([place])   . Acute pulmonary embolism without acute cor pulmonale (HCC)   . Alcohol abuse 11/26/2010   Qualifier: Diagnosis of  By: [name] MD, Pershing Proud  . Anemia due to blood loss, acute 07/15/2012  . CHF (congestive heart failure) ([place])   . Cholecystitis 04/18/2014  . Chronic atrial fibrillation ([place])   . Cigarette smoker    has currently quit  . Cocaine abuse ([place]) 11/26/2010   Qualifier: Diagnosis of  By: [name] MD, Benjamine Mola  has been over a year  . Combined congestive systolic and diastolic heart failure ([place]) 04/18/2014   A. Echo 8/13: Severe LVH, EF 40-45%, inferoposterior akinesis, grade 2 diastolic dysfunction, moderate LAE, mild RVE,  mildly reduced RVSF, mild RAE; cannot rule out R atrial mass-suggest TEE or cardiac MRI  //  B. Echo 3/17: Mild LVH, EF 25-30%, diffuse HK, grade 2 diastolic dysfunction, mild MR, severe LAE,  moderately reduced RVSF, severe RAE, mild TR, moderate PI, PASP 55 mmHg    . Dyspnea   . End stage renal disease on dialysis ([place])   . ESRD (end stage renal disease) on dialysis [facility])    MWF- [place] (05/18/2017)  . Hemodialysis patient ([place])    M,W,F  . Hernia, inguinal, right 08/05/2016  . Hypertension   . 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 Hypocalcemia 07/15/2012  . Medical non-compliance   . Non-ischemic cardiomyopathy ([place])    A. R/L HC 3/17: Normal coronary arteries, moderate pulmonary hypertension (PASP 65 mmHg), elevated LV filling pressures (LVEDP 45 mmHg)   . NSVT (nonsustained ventricular tachycardia) ([place]) 02/22/2016  . NSVT (nonsustained ventricular tachycardia) ([place]) 02/22/2016  . Pneumonia 11/12/2017  . Polysubstance abuse ([place])   . Prolonged Q-T interval on ECG 05/18/2017  . Prolonged QT interval 05/18/2017  . Restless leg syndrome   . Solitary pulmonary nodule 10/10/2015   See cxr 10/09/2015 - CT rec 10/10/2015 >>>   . Upper airway cough syndrome 10/09/2015   Off ACEi around 1st Oct 2016  - Sinus CT 10/10/2015 >>>        Past Surgical History:  Procedure Laterality Date  . [place]  TRANSPOSITION  01/19/2013   Procedure: BASCILIC VEIN TRANSPOSITION;  Surgeon: Conrad [place], MD;  Location: [place] [facility];  Service: Vascular;  Laterality: Left;  left 2nd stage basilic vein transposition  . CARDIAC CATHETERIZATION N/A 02/25/2016   Procedure: Right/Left Heart Cath and Coronary Angiography;  Surgeon: Burnell Blanks, MD;  Location: [facility] CV LAB;  Service: Cardiovascular;  Laterality: N/A;  . INGUINAL HERNIA REPAIR Right 08/05/2016   Procedure: RIGHT  INGUINAL HERNIA REPAIR WITH MESH;  Surgeon: Donnie Mesa, MD;  Location: [place];  Service: General;  Laterality: Right;  . INSERTION OF DIALYSIS CATHETER  07/17/2012   Procedure: INSERTION OF DIALYSIS CATHETER;  Surgeon: Conrad [place], MD;  Location: [place];  Service: Vascular;  Laterality: Right;  right internal jugular  . INSERTION OF MESH Right 08/05/2016   Procedure: INSERTION OF MESH;  Surgeon: Donnie Mesa, MD;  Location: [facility];  Service: General;  Laterality: Right;  . IR GASTROSTOMY TUBE MOD SED  01/09/2020  . IR GASTROSTOMY TUBE REMOVAL  03/28/2020  . IR PATIENT EVAL TECH 0-60 MINS  03/20/2020  . IR THORACENTESIS ASP PLEURAL SPACE W/IMG GUIDE  01/12/2020  . IR THORACENTESIS ASP PLEURAL SPACE W/IMG GUIDE  02/21/2020  . IR THORACENTESIS ASP PLEURAL SPACE W/IMG GUIDE  04/13/2020  . REVISION OF ARTERIOVENOUS GORETEX GRAFT Left 1/61/0960   Procedure: PLICATION OF ARTERIOVENOUS FISTULA LEFT ARM;  Surgeon: Elam Dutch, MD;  Location: [facility];  Service: Vascular;  Laterality: Left;  . REVISON OF ARTERIOVENOUS FISTULA Left 01/06/2017   Procedure: REVISON OF ARTERIOVENOUS FISTULA;  Surgeon: Conrad Elbert, MD;  Location: [facility];  Service: Vascular;  Laterality: Left;  . REVISON OF ARTERIOVENOUS FISTULA Left 10/27/2018   Procedure: REVISION PLICATION OF ARTERIOVENOUS FISTULA  LEFT UPPER ARM;  Surgeon: Waynetta Sandy, MD;  Location: [facility];  Service: Vascular;  Laterality: Left;  . TRACHEOSTOMY TUBE PLACEMENT N/A 11/24/2019   Procedure: TRACHEOSTOMY;  Surgeon: Izora Gala, MD;  Location: [facility];  Service: ENT;  Laterality: N/A;  . UMBILICAL HERNIA REPAIR N/A 08/05/2016   Procedure: [place];  Surgeon: Donnie Mesa, MD;  Location: [place];  Service: General;  Laterality: N/A;  . VENOGRAM N/A 08/09/2012   Procedure: VENOGRAM;  Surgeon: Conrad Buchanan, MD;  Location: [facility] CATH LAB;  Service: Cardiovascular;  Laterality: N/A;    Family History  Problem Relation Age of Onset  .  Hypertension Mother   . Diabetes Mother   . Renal Disease Mother   . Hypertension Father    Social History:  reports that he quit smoking about 17 months ago. His smoking use included cigarettes. He quit after 30.00 years of use. He has never used smokeless tobacco. He reports current alcohol use of about 4.0 standard drinks of alcohol per week. He reports that he does not use drugs.  Allergies:  Allergies  Allergen Reactions  . Losartan Cough, Anaphylaxis and Other (See Comments)    Medications Prior to Admission  Medication Sig Dispense Refill  . acetaminophen (TYLENOL) 325 MG tablet Take 1-2 tablets (325-650 mg total) by mouth every 4 (four) hours as needed for mild pain.    Marland Kitchen amiodarone (PACERONE) 200 MG tablet Take 1 tablet (200 mg total) by mouth daily. 30 tablet 1  . apixaban (ELIQUIS) 2.5 MG TABS tablet Take 1 tablet (2.5 mg total) by mouth 2 (two) times daily. 60 tablet 1  . benztropine (COGENTIN) 1 MG tablet Take 1 tablet (1 mg total) by  mouth 2 (two) times daily. 60 tablet 1  . colchicine 0.6 MG tablet Take 1 tablet (0.6 mg total) by mouth daily. 30 tablet 1  . dextromethorphan-guaiFENesin (MUCINEX DM) 30-600 MG 12hr tablet Take 1 tablet by mouth 2 (two) times daily. 14 tablet 0  . docusate sodium (COLACE) 100 MG capsule Take 1 capsule (100 mg total) by mouth daily. 30 capsule 2  . folic acid (FOLVITE) 1 MG tablet Take 1 tablet (1 mg total) by mouth daily. 30 tablet 1  . levothyroxine (SYNTHROID) 25 MCG tablet Take 1 tablet (25 mcg total) by mouth daily at 6 (six) AM. 30 tablet 1  . Melatonin 3 MG TABS Take 1 tablet (3 mg total) by mouth at bedtime. To help with sleep  0  . midodrine (PROAMATINE) 10 MG tablet Take 1 tablet (10 mg total) by mouth 3 (three) times daily with meals. 90 tablet 1  . multivitamin (RENA-VIT) TABS tablet Take 1 tablet by mouth at bedtime. 30 tablet 0  . oxyCODONE-acetaminophen (PERCOCET) 7.5-325 MG tablet Take 1 tablet by mouth every 4 (four) hours as  needed for severe pain. 30 tablet 0  . predniSONE (DELTASONE) 10 MG tablet Take 1 tablet (10 mg total) by mouth daily with breakfast. 30 tablet 1  . pregabalin (LYRICA) 50 MG capsule Take 1 capsule (50 mg total) by mouth at bedtime. 30 capsule 1  . prochlorperazine (COMPAZINE) 5 MG tablet Take 1 tablet (5 mg total) by mouth every 6 (six) hours as needed for nausea or vomiting. 30 tablet 0  . senna-docusate (SENOKOT-S) 8.6-50 MG tablet Take 1 tablet by mouth at bedtime. 30 tablet 0  . thiamine 100 MG tablet Take 1 tablet (100 mg total) by mouth daily. 30 tablet 1  . traMADol (ULTRAM) 50 MG tablet Take 1 tablet (50 mg total) by mouth every 6 (six) hours as needed for moderate pain or severe pain. 120 tablet 0  . vitamin C (ASCORBIC ACID) 250 MG tablet Take 1 tablet (250 mg total) by mouth 2 (two) times daily.    Marland Kitchen amitriptyline (ELAVIL) 10 MG tablet Take 1 tablet (10 mg total) by mouth at bedtime. 30 tablet 3  . Calcium Acetate 668 (169 Ca) MG TABS Take 1 tablet by mouth daily.       Results for orders placed or performed during the hospital encounter of 04/13/20 (from the past 48 hour(s))  CBC with Differential     Status: Abnormal   Collection Time: 04/15/20 10:18 AM  Result Value Ref Range   WBC 11.7 (H) 4.0 - 10.5 K/uL   RBC 4.33 4.22 - 5.81 MIL/uL   Hemoglobin 12.8 (L) 13.0 - 17.0 g/dL    Comment: REPEATED TO VERIFY   HCT 43.7 39.0 - 52.0 %   MCV 100.9 (H) 80.0 - 100.0 fL   MCH 29.6 26.0 - 34.0 pg   MCHC 29.3 (L) 30.0 - 36.0 g/dL   RDW 25.6 (H) 11.5 - 15.5 %   Platelets 196 150 - 400 K/uL   nRBC 0.6 (H) 0.0 - 0.2 %   Neutrophils Relative % 78 %   Neutro Abs 9.2 (H) 1.7 - 7.7 K/uL   Lymphocytes Relative 8 %   Lymphs Abs 1.0 0.7 - 4.0 K/uL   Monocytes Relative 10 %   Monocytes Absolute 1.2 (H) 0.1 - 1.0 K/uL   Eosinophils Relative 1 %   Eosinophils Absolute 0.1 0.0 - 0.5 K/uL   Basophils Relative 1 %   Basophils Absolute  0.1 0.0 - 0.1 K/uL   Immature Granulocytes 2 %   Abs  Immature Granulocytes 0.19 (H) 0.00 - 0.07 K/uL   Polychromasia PRESENT     Comment: Performed at [facility] [address], [place], [place] [address]  Basic metabolic panel Once     Status: Abnormal   Collection Time: 04/15/20 10:18 AM  Result Value Ref Range   Sodium 135 135 - 145 mmol/L   Potassium 4.3 3.5 - 5.1 mmol/L   Chloride 93 (L) 98 - 111 mmol/L   CO2 28 22 - 32 mmol/L   Glucose, Bld 109 (H) 70 - 99 mg/dL    Comment: Glucose reference range applies only to samples taken after fasting for at least 8 hours.   BUN 37 (H) 6 - 20 mg/dL   Creatinine, Ser 4.73 (H) 0.61 - 1.24 mg/dL   Calcium 9.0 8.9 - 10.3 mg/dL   GFR calc non Af Amer 13 (L) >60 mL/min   GFR calc Af Amer 15 (L) >60 mL/min   Anion gap 14 5 - 15    Comment: Performed at [facility] [address], [place], [place] [address]  TSH Once     Status: Abnormal   Collection Time: 04/15/20 10:18 AM  Result Value Ref Range   TSH 7.508 (H) 0.350 - 4.500 uIU/mL    Comment: Performed by a 3rd Generation assay with a functional sensitivity of <=0.01 uIU/mL. Performed at [facility], [place] [address] [place], [place] [address]   Vitamin B12     Status: None   Collection Time: 04/15/20 10:18 AM  Result Value Ref Range   Vitamin B-12 504 180 - 914 pg/mL    Comment: (NOTE) This assay is not validated for testing neonatal or myeloproliferative syndrome specimens for Vitamin B12 levels. Performed at [facility], [place] [address], [place], [place] [address]   CBC     Status: Abnormal   Collection Time: 04/16/20  6:15 AM  Result Value Ref Range   WBC 12.1 (H) 4.0 - 10.5 K/uL   RBC 4.46 4.22 - 5.81 MIL/uL   Hemoglobin 13.1 13.0 - 17.0 g/dL   HCT 45.3 39.0 - 52.0 %   MCV 101.6 (H) 80.0 - 100.0 fL   MCH 29.4 26.0 - 34.0 pg   MCHC 28.9 (L) 30.0 - 36.0 g/dL   RDW 25.2 (H) 11.5 - 15.5 %   Platelets 198 150 - 400 K/uL   nRBC 0.7 (H) 0.0 - 0.2 %    Comment: Performed at [facility] [address], [place], [place] [address]  Basic metabolic panel     Status: Abnormal   Collection Time: 04/16/20  6:15 AM  Result Value Ref Range   Sodium 135 135 - 145 mmol/L   Potassium 4.9 3.5 - 5.1 mmol/L   Chloride 92 (L) 98 - 111 mmol/L   CO2 27 22 - 32 mmol/L   Glucose, Bld 90 70 - 99 mg/dL    Comment: Glucose reference range applies only to samples taken after fasting for at least 8 hours.   BUN 66 (H) 6 - 20 mg/dL   Creatinine, Ser 6.31 (H) 0.61 - 1.24 mg/dL   Calcium 9.0 8.9 - 10.3 mg/dL   GFR calc non Af Amer 9 (L) >60 mL/min   GFR calc Af Amer 11 (L) >60 mL/min   Anion gap 16 (H) 5 - 15    Comment: Performed at  [facility], [place] [address] [place], [place] [address]  Renal function panel     Status: Abnormal   Collection Time: 04/17/20  8:16 AM  Result Value Ref Range   Sodium 132 (L) 135 - 145 mmol/L   Potassium 4.5 3.5 - 5.1 mmol/L   Chloride 93 (L) 98 - 111 mmol/L   CO2 24 22 - 32 mmol/L   Glucose, Bld 115 (H) 70 - 99 mg/dL    Comment: Glucose reference range applies only to samples taken after fasting for at least 8 hours.   BUN 72 (H) 6 - 20 mg/dL   Creatinine, Ser 5.75 (H) 0.61 - 1.24 mg/dL   Calcium 8.4 (L) 8.9 - 10.3 mg/dL   Phosphorus 7.5 (H) 2.5 - 4.6 mg/dL   Albumin 3.1 (L) 3.5 - 5.0 g/dL   GFR calc non Af Amer 10 (L) >60 mL/min   GFR calc Af Amer 12 (L) >60 mL/min   Anion gap 15 5 - 15    Comment: Performed at [facility] [address] [place], [place] [address]  Magnesium     Status: None   Collection Time: 04/17/20  8:16 AM  Result Value Ref Range   Magnesium 2.2 1.7 - 2.4 mg/dL    Comment: Performed at [facility] [address], [place], [place] [address]  CBC     Status: Abnormal   Collection Time: 04/17/20  8:26 AM  Result Value Ref Range   WBC 10.4 4.0 - 10.5 K/uL   RBC 3.87 (L) 4.22 - 5.81 MIL/uL   Hemoglobin 11.4 (L) 13.0 - 17.0 g/dL   HCT 39.1 39.0 - 52.0 %   MCV 101.0 (H) 80.0 - 100.0 fL   MCH 29.5 26.0 - 34.0 pg    MCHC 29.2 (L) 30.0 - 36.0 g/dL   RDW 24.7 (H) 11.5 - 15.5 %   Platelets 192 150 - 400 K/uL   nRBC 0.5 (H) 0.0 - 0.2 %    Comment: Performed at [facility], [place] [address], [place], [place] [address]   DG CHEST PORT 1 VIEW  Result Date: 04/17/2020 CLINICAL DATA:  Short of breath. EXAM: PORTABLE CHEST 1 VIEW COMPARISON:  04/13/2020 FINDINGS: Cardiac enlargement. Moderate to large right pleural effusion is again noted and appears unchanged. Associated diminished aeration to the right base noted. Atelectasis versus infiltrate noted in the retrocardiac left base. IMPRESSION: 1. Persistent right pleural effusion. 2. Medial left base atelectasis versus infiltrate. Electronically Signed   By: Kerby Moors M.D.   On: 04/17/2020 09:04    Review Of Systems Constitutional: Positive fever, chills, weight loss and gain. Eyes: No vision change, wears glasses. No discharge or pain. Ears: No hearing loss, No tinnitus. Respiratory: No asthma,Positive COPD, pneumonias, shortness of breath. No hemoptysis. Cardiovascular: Positive chest pain, palpitation, leg edema. Gastrointestinal: No nausea, vomiting, diarrhea, constipation. No GI bleed. No hepatitis. Genitourinary: No dysuria, hematuria, kidney stone. No incontinance. ESRD. Neurological: No headache, stroke, seizures.  Psychiatry: No psych facility admission for anxiety, depression, suicide. No detox. Skin: No rash. Musculoskeletal: Positive joint pain, fibromyalgia. No neck pain, back pain. Bilateral lower legs wounds/gangrene of toes Lymphadenopathy: No lymphadenopathy. Hematology: No anemia or easy bruising.   Blood pressure 102/72, pulse 99, temperature 98.6 F (37 C), temperature source Oral, resp. rate 18, weight 67.3 kg, SpO2 90 %. Body mass index is 21.91 kg/m. General appearance: alert, cooperative, appears stated age and severe respiratory distress in supine position  Head: Normocephalic, atraumatic. Eyes: Brown eyes, pink  conjunctiva, corneas clear. PERRL, EOM's intact. Neck: No adenopathy, no carotid bruit, + JVD, supple, symmetrical, trachea midline and thyroid not enlarged. Resp: Basal crackles to auscultation bilaterally. Cardio: Irregular rate and rhythm, S1, S2 normal, III/VI systolic murmur, no click, rub or gallop GI: Soft, non-tender; bowel sounds normal; no organomegaly. Positive ascities. Extremities: Trace edema, no cyanosis or clubbing. Lower leg and feet wounds. Skin: Warm and dry.  Neurologic: Alert and oriented X 3.  Assessment/Plan Dilated, non-ischemic cardiomyopathy Acute on chronic Left ventricular systolic failure, HFrEF Chronic RV failure Dilated LA and RA Mild MR and moderate to severe TR COPD ESRD Paroxysmal atrial fibrillation, CHA2DS2VASc score of 3 H/O PE H/O Bilateral feet gangrene S/P PEA cardiac arrest Non-sustained VT  Increase amiodarone to 200 mg. Twice daily x 3 days.  Cannot tolerate B-blocker due to CHF and COPD. RV failure makes it difficult for further aggressive diuresis. Chronic oxygen supplementation for advanced COPD. Hemodialysis or ultrafiltration as tolerated per renal. Long term prognosis remains poor.  Time spent: Review of old records, Lab, x-rays, EKG, other cardiac tests, examination, discussion with patient, family, nurse, referring doctor over 70 minutes.  Birdie Riddle, MD  04/17/2020, 10:15 AM

## 2020-04-17 NOTE — Progress Notes (Addendum)
CCMD called due to pt having 13 beat run of vtach. Pt sitting on side of bed with no distress noted. MD on call paged.

## 2020-04-17 NOTE — Progress Notes (Signed)
Patient ID: [name] [name], male   DOB: May 28, 1966, 54 y.o.   MRN: [id number] [facility] Progress Note   Assessment/ Plan:   1.  Shortness of breath: This is secondary to combination of pulmonary edema and pleural effusions; improved with hemodialysis x 2 and right-sided thoracentesis, but CXR today showing persistent effusion on R > L. On nasal O2. Next HD tomorrow as below. 2.  NSVT - starting IV amio.   3.  HFrEF - EF 30-35% 4.  Atrial fib - on eliquis and amio po 5.  Hx of pericardial effusion - on home prednisone 6.  ESRD:  usual HD TTS.  Had 2 good HD sessions w/ 6L off, yest didn't get much fluid off d/t hypotension. Plan HD tomorrow, off schedule, cont to lower volume if BP's will tolerate.  Switched to renal diet, needs tight fluid restriction.   7.  Hypertension/volume: no sig edema but up several kg still. HD tomorrow, will try 2.5- 3 L off. Getting midodrine 10 tid.  8.  Anemia:  Hemoglobin and hematocrit currently at goal without ESA.  9.  Metabolic bone disease:  resume phoslo for phos 7.5, Ca okay at 8.4. calcitriol for PTH control. 10. Nutrition:   Improving appetite/weight following his prolonged discharge from the hospital, reevaluate EDW.    Kelly Splinter, MD 04/17/2020, 2:04 PM    Subjective:   Pt seen in room today. No new c/o's.  Having more NSVT.    Objective:   BP (!) 131/116 (BP Location: Left Leg)   Pulse 81   Temp 98.1 F (36.7 C) (Oral)   Resp 16   Wt 67.3 kg   SpO2 97%   BMI 21.91 kg/m    OP HD: [facility]  4h 13min  61.5kg  450/1.5  P4  2/2 bath  AVF 15g  Hep 5000   - Calcitriol 0.64mcg PO q HD   - Calcium acetate 3 tab TID   Physical Exam: Gen: Appears chronically ill, nasal O2, no distress CVS: Pulse regular rhythm, normal rate, S1 and S2 normal Resp: Diminished breath sounds over bases with fine rales right base. Abd: Soft, obese, nontender Ext: 1+ bilat ankle edema, no hip or dependent edema Neuro: confused and stuporous on HD  this am, more alert at the time of writing this note  Labs: BMET Recent Labs  Lab 04/13/20 1044 04/13/20 1225 04/15/20 1018 04/16/20 0615 04/17/20 0816  NA 140 137 135 135 132*  K 4.3 4.3 4.3 4.9 4.5  CL 93*  --  93* 92* 93*  CO2 31  --  28 27 24   GLUCOSE 118*  --  109* 90 115*  BUN 36*  --  37* 66* 72*  CREATININE 5.81*  --  4.73* 6.31* 5.75*  CALCIUM 8.7*  --  9.0 9.0 8.4*  PHOS  --   --   --   --  7.5*   CBC Recent Labs  Lab 04/13/20 1044 04/13/20 1044 04/13/20 1225 04/15/20 1018 04/16/20 0615 04/17/20 0826  WBC 11.1*  --   --  11.7* 12.1* 10.4  NEUTROABS 8.8*  --   --  9.2*  --   --   HGB 12.7*   < > 16.3 12.8* 13.1 11.4*  HCT 44.9   < > 48.0 43.7 45.3 39.1  MCV 102.7*  --   --  100.9* 101.6* 101.0*  PLT 221  --   --  196 198 192   < > = values in this interval  not displayed.      Medications:    . amiodarone  200 mg Oral BID  . amitriptyline  10 mg Oral QHS  . apixaban  2.5 mg Oral BID  . benztropine  1 mg Oral BID  . Chlorhexidine Gluconate Cloth  6 each Topical Daily  . dextromethorphan-guaiFENesin  1 tablet Oral BID  . docusate sodium  100 mg Oral Daily  . feeding supplement (NEPRO CARB STEADY)  237 mL Oral BID BM  . feeding supplement (PRO-STAT SUGAR FREE 64)  30 mL Oral BID  . folic acid  1 mg Oral Daily  . levothyroxine  25 mcg Oral QAC breakfast  . midodrine  10 mg Oral TID WC  . multivitamin  1 tablet Oral QHS  . mupirocin ointment  1 application Nasal BID  . povidone-iodine   Topical BID  . predniSONE  10 mg Oral Q breakfast  . pregabalin  50 mg Oral QHS  . sodium chloride flush  3 mL Intravenous Once  . thiamine  100 mg Oral Daily  . vitamin C  250 mg Oral BID

## 2020-04-17 NOTE — Progress Notes (Signed)
CCMD called due to pt having several runs of nonsustained V Tach. Pt is in bed, asleep. MD paged.

## 2020-04-17 NOTE — Progress Notes (Signed)
[facility] Progress Note Patient Name: [name] [name] DOB: [birth year] MRN: [id number]   Date of Service  04/17/2020  HPI/Events of Note  Patient having frequent runs of NSVT - Patient is on Amiodarone PO. However, he has a history of prologed QT interval. Therefore, reluctant to use more Amiodarone. Cardiology following the patient.   eICU Interventions  Will order: 1. Lidocaine 1 mg/kg IV bolus now.  2. K+ and Mg++ levels STAT. 3. Magnesium 2 gm IV now. 4. Will request that nursing call cardiology for further management.      Intervention Category Major Interventions: Arrhythmia - evaluation and management  Shala Baumbach Cornelia Copa 04/17/2020, 9:19 PM

## 2020-04-17 NOTE — Progress Notes (Signed)
Nutrition Follow-up  DOCUMENTATION CODES:   Not applicable  INTERVENTION:   -D/c Nepro Shake po BID, each supplement provides 425 kcal and 19 grams protein -Increase 30 ml Prostat TID, each supplement provides 100 kcals and 15 grams protein -Continue renal MVI daily -Magic cup TID with meals, each supplement provides 290 kcal and 9 grams of protein  NUTRITION DIAGNOSIS:   Increased nutrient needs related to chronic illness, wound healing(ESRD on HD; chronic BLE wounds) as evidenced by estimated needs.  Ongoing  GOAL:   Patient will meet greater than or equal to 90% of their needs  Progressing   MONITOR:   PO intake, Supplement acceptance, I & O's, Labs, Skin, Weight trends  REASON FOR ASSESSMENT:   Malnutrition Screening Tool    ASSESSMENT:   54 year old male with past medical history significant for HTN, CHF, previous cardiac arrest requiring extended hospitalization (12/20-2/21), chronic hypoxic respiratory failure, pulmonary embolism, ESRD on HD, hypocalcemia and atrial fibrillation presented due to SOB and increases in home oxygen therapy from 2 to 2.5 L, noted having difficulty with speaking in complete sentences without having to take a breath, endorses significant orhtopnea as well as peripheral edema, abdominal distention, and chronic cough productive of yellow sputum. CXR revealed bilateral pleural effusions and cardiomegaly with pulmonary vascular congestion, worsening hypoxia in ED requiring increased O2 support maintained on 10L HFNC, and IR consulted in ED for aspiration of pleural effusions.  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   Pt lying in bed, in obvious pain and discomfort at time of visit.   Per nephrology notes, plan for HD tomorrow. HD sessions have been cut short  secondary to hypotension. EDW 61.5 kg.   Meal tray on tray table; noted unattempted. Pt with variable meal intake- PO 10-100%.   Pt is taking Prostat supplements, however, acceptance of Nepro supplements I variable. Pt has been placed on a tight fluid restriction (1 L) per nephrology.   Medications reviewed and include colace, folvite, thiamine, vitamin C, and prednisone.   Labs reviewed: Na: 132, Phos: 7.5. K and Mg WDL.   NUTRITION - FOCUSED PHYSICAL EXAM:    Most Recent Value  Orbital Region  No depletion  Upper Arm Region  No depletion  Thoracic and Lumbar Region  Unable to assess  Buccal Region  No depletion  Temple Region  No depletion  Clavicle Bone Region  No depletion  Clavicle and Acromion Bone Region  No depletion  Scapular Bone Region  No depletion  Dorsal Hand  No depletion  Patellar Region  No depletion  Anterior Thigh Region  No depletion  Posterior Calf Region  No depletion  Edema (RD Assessment)  Mild  Hair  Reviewed  Eyes  Reviewed  Mouth  Reviewed  Skin  Reviewed  Nails  Reviewed       Diet Order:   Diet Order            Diet renal with fluid restriction Fluid restriction: Other (see comments); Room service appropriate? Yes; Fluid consistency: Thin  Diet effective now              EDUCATION NEEDS:   No education needs have been identified at this time  Skin:  Skin Assessment: Skin Integrity Issues: Skin Integrity Issues:: Other (  Comment) Other: dry gangrene to all toes on bilateral feet  Last BM:  04/15/20  Height:   Ht Readings from Last 1 Encounters:  04/06/20 5\' 9"  (1.753 m)    Weight:   Wt Readings from Last 1 Encounters:  04/17/20 67.3 kg   BMI:  Body mass index is 21.91 kg/m.  Estimated Nutritional Needs:   Kcal:  1950-2150  Protein:  110-125 grams  Fluid:  1 L    Loistine Chance, RD, LDN, [place] Registered Dietitian II Certified Diabetes Care and Education Specialist Please refer to [facility] for RD and/or RD  on-call/weekend/after hours pager

## 2020-04-17 NOTE — Progress Notes (Signed)
Subjective:  [name] [name] is a 54 year old man with a past medical history significant for for hypertension, HFrEF, chronic hypoxic respiratory failure, hemodialysis dependent end-stage renal disease, atrial fibrillation, and a recent prolonged hospitalization (11/2019-01/2020) who was admitted to IMTS on 04/13/2020 for acute hypoxic respiratory failure.   Overnight events: Patient was unable to undergo full dialysis session yesterday due to hypotension and confusion, per nephrology note. Patient had several episodes of nonsustained ventricular tachycardia beginning at 5:30 am this morning.  [name] [name] was evaluated and examined this morning bedside. Patient was sitting upright on the edge of the bed, in no apparent distress. Father was present bedside. Patient denies any chest pain, palpitations, dyspnea, or orthopnea.   Objective:  Vital signs in last 24 hours: Vitals:   04/16/20 1957 04/17/20 0556 04/17/20 0559 04/17/20 0839  BP: (!) 133/115 (!) 144/107  102/72  Pulse: 68 95  99  Resp:  20  18  Temp:  98.2 F (36.8 C)  98.6 F (37 C)  TempSrc:  Oral  Oral  SpO2:  97%  90%  Weight:   67.3 kg    Weight change: -1.161 kg  Intake/Output Summary (Last 24 hours) at 04/17/2020 1122 Last data filed at 04/17/2020 0816 Gross per 24 hour  Intake 480 ml  Output 371 ml  Net 109 ml   Physical Exam: General:Well developed, chronically ill appearing male seated upright on edge of bed. Head:Normocephalic, atraumatic. Respiratory:On supplemental O2 5Lnasal canula,able to speak in short sentences. Mildly diminished right-sided breath sounds, left-sided lung sounds appreciated with lung crackles heard over base of lungs, bilaterally. Abdomen:Mildly distended, non-tender to palpation. Normoactive bowel sounds. Bandage over mid-epigastric region. Cardiovascular:S1 normal, S2 normal. Irregular rate and rhythm. No click, rub, or gallop. Neuro: Alert. Moves all extremities  spontaneously. Psych: Responds to questions appropriately with a normal affect. Skin: Warm and dry. Bilateral foot wounds.   Assessment/Plan:  Active Problems:   Acute respiratory failure (HCC)   Acute on chronic respiratory failure (HCC)  [name] [name] is a 54 year old male with a pastmedical history significant for hypertension, congestive heart failure, previous cardiac arrest requiring extended hospitalization (11/2019-01/2020), chronic hypoxic respiratory failure, pulmonary embolism, end-stage renal disease on dialysis, prolonged QT interval, right wrist fracture, hypocalcemia, and atrial fibrillation on Eliquis who presented to the emergency department with shortness of breath.  # Arrhythmia: Nonsustained Ventricular Tachycardia Patient has had several episodes of nonsustained ventricular tachycardia beginning at 6:50 am this morning. Plan: - Obtain Renal Function Panel and Magnesium labs. - Cardiology consulted. Per cardiology, recommend 1 time bolus 150 mg amiodarone IV. Will refer to cardiology for management with amiodarone. - Echocardiogram to assess for potential pericardial effusion as explanation for arrhythmia. - Electrocardiogram. - Check troponin levels.    #Acute Respiratory Failure # Bilateral Pleural Effusions Patientis on 5L oxygen supplementation via nasal canula.On 04/13/2020 patient underwent right thoracentesis that removed 400 mL and hemodialysis. Net input/ouput since admission is approximately -5.0 L. This episode of acute on chronic acute respiratory failure is likely secondary to volume overload. Patient seems to be improving with fluid removal interventions. However, patient has been unable to undergo full dialysis session due to hypotension and confusion. Plan: - Obtain chest x-ray to assess progression of bilateral pleural effusions and volume status.  - Monitor SpO2 with pulse oximetry - Maintain O2 saturation greater than 90% - Continue to wean  supplemental O2 to baseline level of 2L as tolerated. - Fluid restriction - Continuous cardiac monitoring - Daily  Weights - Monitor Intake and Output  #End-stage Renal Disease Patient was unable to complete full dialysis session yesterday due to hypotension and confusion. Plan: -Management per nephrology. - Consider palliative consult given patient has been unable to tolerate dialysis. - Continue calcitriol and calcium acetate - Continue midodrine  # HFrEF, EF 30-35% Plan: - Telemetry monitoring - Fluid restriction - Daily weights - Strict I/Os   #Atrial fibrillation Patient has history of atrial fibrillation with outpatient Eliquis for clotting prevention. Plan: - Continue Eliquis for clot prevention - Is currently receiving one time 150 mg IV administration of amiodarone. Was already taking amiodarone 200 mg daily for atrial fibrillation. Will refer to cardiology for amiodarone dosage and administration.   #Bilateral foot wounds Patient reports bilateral foot wounds that occasionally drain. [name] maintains wound care with dressings. Patient had history of refusing wound care. After discussion this morning, he has agreed to allow for inpatient wound care. Plan: -Continue to allow father to change dressings. - Continue amitriptyline 10 mg + pregabalin 50 mg at bedtime for chronic pain  #History of pericardial effusion: Plan: -Continue home colchicine - Continue home prednisone.  #History ofEPS symptoms: Previously on seroquel, developed EPS symptoms, started on benztropine. Plan: -Continue benztropine  # Hypothyroidism: Continue home levothyroxine 25 mcg daily. TSH obtained yesterday and elevated at 7.508. Plan: - Recheck TSH outpatient. - Continue synthroid   # Nutrition Patient has a extensive past medical history of chronic illness and recent prolonged hospitalization. Would likely benefit from improving appetite/weight following  his recent discharge from previous hospital admission. Plan: - Management per nutrition consult.  DVT prophylaxis:On therapeutic anticoagulation with eliquis Code Status: Full Dispo: Admit patient toInpatient with expected length of stay greater than 2 midnights.     LOS: 4 days   Nanetta Batty, Medical Student 04/17/2020, 11:22 AM

## 2020-04-17 NOTE — Progress Notes (Signed)
Patient arrived to 87mo5 on Bipap alert and trying to talk and restless. Patient changed to 5L nasal cannula so he can talk with his family at bedside

## 2020-04-17 NOTE — Significant Event (Signed)
Rapid Response Event Note  Overview: Neurologic - Decreased LOC  Initial Focused Assessment: Called by 3E AD about patient having decreased LOC and diaphoresis. Staff had already checked a blood sugar - it was normal. Upon arrival IMTS was present. Patient was very lethargic, occasionally answers questions, able to follow commands when frequently prompted. Can move all extremities to command. HR 90 AF with bursts of NSVT. SBP 80 - 100s on LLE (cannot obtain on BUE). When I arrived, patient was on VM and saturations were only 83%, I transitioned the patient to a NRB 15L. Lung sounds - crackles throughout - wet lung sounds, RR 16-22. Patient has been twitching, appears uremic and perhaps hypercarbic (ABG ordered). Abdomen distended. Chronic wounds down legs and on bilateral feet. PCCM consulted and came to the bedside  Interventions: -- STAT ABG -- unsuccessful x 2 -- STAT BIPAP -- mental status   Plan of Care: -- Transferred to 2M04 for pressors and CRRT on BIPAP.   Event Summary:  Start Time 1602  Arrival Time 1603 End Time 1735  Inessa Wardrop R

## 2020-04-17 NOTE — Progress Notes (Addendum)
NAME:  [name] [name], MRN:  [id number], DOB:  1966-08-11, LOS: 4 ADMISSION DATE:  04/13/2020, CONSULTATION DATE:  04/13/20 REFERRING MD:  Langston Masker - EM, CHIEF COMPLAINT:  SOB  Brief History   54 yo presenting with worsening SOB, hypoxia. ESRD pt who has been recommended for extra runs of iHD outpatient but refuses. CXR with bilateral pleural effusions, pulmonary edema.   History of present illness   54 yo M PMH ESRD on T/R/Sa HD, HTN, CHF, PE and  Afib (now on eliquis), prior cardiac arrest with prolonged hospitalization (12/20-2/21), prior tracheostomy status who now wears 2LNC at baseline, who presented to [facility]/30 with worsening SOB. WOB has been progressive x 2weeks, and is worse when laying flat thus causing sleep disturbances. Today, patient was referred to seek medical attn by PT who noted pt was SOB. Presented to IM clinic  who noted patient was somnolent, hypoxic, SOB and referred to ED. Prior to presentation, patient tried increasing home O2 to 2.5L without significant improvement. Denied cough, wheezing, chest pain, unilateral extremity swelling. Endorsed mild BLE edema.  In ED, pt SpO2 96% on 2.5L with increased respiratory effort. CXR with pulmonary edema, bilateral pleural effusions. In ED, worsening hypoxia requiring increasing O2 support, to BiPAP (briefly attempted, pt refused), before arriving at 10L HFNC.   Has has since been admitted to the hospitalist service and has undergone HD x 2, however, sessions have been limited by AMS and hypotension. He underwent R thoracentesis for pleual effusion with 433mL out.  Felt to be due to volume overload. Then on 5/4 his course became complicated by NSVT. Cardiology was consulted and increased his amiodarone dose along with an IV bolus. As the day progressed he became progressively dyspneic, hypoxemic, and encephalopathic. He was placed on BiPAP, but level of consciousness did not improve. PCCM was consulted.    Past Medical History  Cardiac  arrest ESRD HTN CHF Polysubstance use disorder PE Afib  NICM  Prolonged qtc RLS Anemia Acute on chronic respiratory failure requiring tracheostomy, now decannulated   Significant Hospital Events   4/30 presents to ED with SOB, hypoxia. Found to be volume overloaded. Wishes to leave AMA but ultimately agrees to stay  Consults:  Nephrology PCCM   Procedures:    Significant Diagnostic Tests:  CXR 4/30> bilateral pleural effusions. Pulmonary edema, vascular congestion. Bibasilar atelectasis.  ECho 5/4 > LVEF 30-35%. LV global HK. RVSF moderately reduced. Severe dilation of RA.   Micro Data:  4/30 SARS Cov2> neg 4/30 Flu A/B> neg   Antimicrobials:    Interim history/subjective:  Recently started on BiPAP  Objective   Blood pressure 90/71, pulse 87, temperature 97.6 F (36.4 C), temperature source Axillary, resp. rate 20, weight 67.3 kg, SpO2 99 %.        Intake/Output Summary (Last 24 hours) at 04/17/2020 1743 Last data filed at 04/17/2020 1553 Gross per 24 hour  Intake 700 ml  Output --  Net 700 ml   Filed Weights   04/16/20 0400 04/16/20 1300 04/17/20 0559  Weight: 66.4 kg 65.2 kg 67.3 kg    Examination:  General:  Ill appearing AAM comfortable on BiPAP HEENT: full face mask, pupils 3/reative, +JVD Neuro:  Will awaken intermittently and open eyes to voice, otherwise, not following commands/ somnolent  CV: irir, LUE AVF- dressing intact PULM:  Non labored on BiPAP 12/5, getting TV 300-500, coarse bs and diminished in bases GI: protuberant, does not seem tender Extremities: warm/dry, +1 LE edema, dry wound to RLE,  bilateral feet with dressing Skin: no rashes   Resolved Hospital Problem list     Assessment & Plan:   Acute respiratory failure with hypoxia and hypercarbia Bilateral Pleural Effusions: s/p thoracentesis 4/30. Felt to be due to volume overload.  Pulmonary edema -in setting of ESRD with volume overload, he has been unable to fully tolerate HD.    P - Transfer to ICU - Continue BiPAP as tolerated. Mental status improving.  - At risk for intubation - Volume removal with HD - Follow CXR - Effusions should improve with volume removal  ESRD T/R/Sa iHD P - per nephrology - HD tonight. Has been limited by hypotension historically, but MAP now 109. Can give peripheral pressors if needed for HD tonight. Continue midodrine TID.  Will attempt iHD, may need CRRT if does not tolerate.  Son has been consented and agrees to [facility] if needed.  - may need PMT consult if patient continues to not tolerate iHD with HFrEF  NSVT: - Amiodarone increased and IV bolus by cardiology - Cardiology following.  - Follow electrolytes  Hx Afib Hx PE Hx pericardial effusion P -Cardiac monitoring -continue Eliquis  -Continue home prednisone, consider stress dosing in the setting of hypotension and acute illness   Best practice:  Diet: NPO Pain/Anxiety/Delirium protocol (if indicated): na VAP protocol (if indicated): na DVT prophylaxis: Eliquis GI prophylaxis: na  Glucose control: monitor  Mobility: PT  Code Status: Full  Family Communication: Patient's son and daughter at length at bedside.  They are conflicted over patient's wishes given he went through so much since December and dont wish to see him suffer.  For now, they wish to continue all aggressive measures.   Disposition: ICU, critically ill.   Labs   CBC: Recent Labs  Lab 04/13/20 1044 04/13/20 1225 04/15/20 1018 04/16/20 0615 04/17/20 0826  WBC 11.1*  --  11.7* 12.1* 10.4  NEUTROABS 8.8*  --  9.2*  --   --   HGB 12.7* 16.3 12.8* 13.1 11.4*  HCT 44.9 48.0 43.7 45.3 39.1  MCV 102.7*  --  100.9* 101.6* 101.0*  PLT 221  --  196 198 253    Basic Metabolic Panel: Recent Labs  Lab 04/13/20 1044 04/13/20 1225 04/15/20 1018 04/16/20 0615 04/17/20 0816  NA 140 137 135 135 132*  K 4.3 4.3 4.3 4.9 4.5  CL 93*  --  93* 92* 93*  CO2 31  --  28 27 24   GLUCOSE 118*  --  109* 90  115*  BUN 36*  --  37* 66* 72*  CREATININE 5.81*  --  4.73* 6.31* 5.75*  CALCIUM 8.7*  --  9.0 9.0 8.4*  MG  --   --   --   --  2.2  PHOS  --   --   --   --  7.5*   GFR: Estimated Creatinine Clearance: 14.5 mL/min (A) (by C-G formula based on SCr of 5.75 mg/dL (H)). Recent Labs  Lab 04/13/20 1044 04/15/20 1018 04/16/20 0615 04/17/20 0826  WBC 11.1* 11.7* 12.1* 10.4    Liver Function Tests: Recent Labs  Lab 04/13/20 1044 04/17/20 0816  AST 16  --   ALT 19  --   ALKPHOS 124  --   BILITOT 0.8  --   PROT 6.4*  --   ALBUMIN 3.2* 3.1*   No results for input(s): LIPASE, AMYLASE in the last 168 hours. No results for input(s): AMMONIA in the last 168 hours.  ABG  Component Value Date/Time   PHART 7.373 01/26/2020 1123   PCO2ART 48.0 01/26/2020 1123   PO2ART 73.7 (L) 01/26/2020 1123   HCO3 32.6 (H) 04/13/2020 1225   TCO2 35 (H) 04/13/2020 1225   ACIDBASEDEF 0.5 01/19/2020 1508   O2SAT 99.0 04/13/2020 1225     Coagulation Profile: No results for input(s): INR, PROTIME in the last 168 hours.  Cardiac Enzymes: No results for input(s): CKTOTAL, CKMB, CKMBINDEX, TROPONINI in the last 168 hours.  HbA1C: Hgb A1c MFr Bld  Date/Time Value Ref Range Status  12/25/2019 03:50 AM 4.7 (L) 4.8 - 5.6 % Final    Comment:    (NOTE) Pre diabetes:          5.7%-6.4% Diabetes:              >6.4% Glycemic control for   <7.0% adults with diabetes   12/02/2019 05:27 AM 6.2 (H) 4.8 - 5.6 % Final    Comment:    (NOTE) Pre diabetes:          5.7%-6.4% Diabetes:              >6.4% Glycemic control for   <7.0% adults with diabetes     CBG: Recent Labs  Lab 04/17/20 1556  GLUCAP 131*    CCT:   [place], MSN, AGACNP-BC [facility] 04/17/2020, 6:29 PM  See Amion for personal pager PCCM on call pager [phone]

## 2020-04-17 NOTE — Progress Notes (Signed)
Update note:   Paged by bedside RN this morning for repeat runs of nonsustained VTach.  Pt assessed at bedside by myself and Dr. Jeryl Columbia.  Pt denies symtoms at this time. No significant changes in PE this morning. Tele reviewed indicating several nonsustained runs of Vtach starting around 0530 this morning. Longest run 13 beats.  Remains on 4L supplemental O2 at 97%. Vitals:   04/16/20 1957 04/17/20 0556 04/17/20 0559 04/17/20 0839  BP: (!) 133/115 (!) 144/107  102/72  Pulse: 68 95  99  Temp:  98.2 F (36.8 C)  98.6 F (37 C)  Resp:  20  18  Weight:   67.3 kg   SpO2:  97%  90%   General: in NAD HEENT: [place]  Cardiac: tachycardic rate, irreg rhythm. Mild LE edema Pulm: lung sounds diminished bilaterally Abd: largely distended Neuro: alert and oriented. Myoclonus in upper extremities  BMP Latest Ref Rng & Units 04/17/2020 04/16/2020 04/15/2020  Glucose 70 - 99 mg/dL 115(H) 90 109(H)  BUN 6 - 20 mg/dL 72(H) 66(H) 37(H)  Creatinine 0.61 - 1.24 mg/dL 5.75(H) 6.31(H) 4.73(H)  Sodium 135 - 145 mmol/L 132(L) 135 135  Potassium 3.5 - 5.1 mmol/L 4.5 4.9 4.3  Chloride 98 - 111 mmol/L 93(L) 92(L) 93(L)  CO2 22 - 32 mmol/L 24 27 28   Calcium 8.9 - 10.3 mg/dL 8.4(L) 9.0 9.0     Assessment: increasing frequency of Vtach runs.  No significant electrolyte derangements on labs.  EKG obtained and revealing RBBB, 1st degree AVB, possible anterior and inferior infarcts. Cardiology, Dr. Doylene Canard, consulted noting that pt is not a candidate for ICD at this time due to his chronic wounds and lack of optimalized heart failure regimen. Limited echocardiogram revealing biventricular failure with LVEF of 25-30% with generalized LV and RV hypokinesis per cardiology note.  Plan: Unfortunately, Mr. Lagrow has a very poor long term prognosis. Given the increased frequency of his VTach runs, I would not be surprised if he arrests again at some point in the near future.  He may be, unfortunately, be reaching a  point where he would not tolerate dialyzing due to his cardiac function given his recent inability to tolerate full HD sessions. -will increase amio to 200mg  bid  -nephrology discussing goals of care--awaiting their note regarding the discussion. If he were to undergo cardiac arrest, unfortunately, I do not foresee him having favorable outcomes due to his numerous comorbidities and recent cardiac arrests in dec/jan.  -will continue to monitor closely throughout the day  [name], MD 04/17/20  0830 AM   Addendum #1: revisited tele monitor after rounds this morning. Tanna Furry runs becoming increasingly more frequent--now almost every minute. Unfortunately, no significant changes in management at this time. Will continue to follow along with cardiology recommendations. 11:36 AM

## 2020-04-17 NOTE — Progress Notes (Signed)
PCCM Family Communication Note  Discussed ongoing decline, NSVT, poor tolerance of iHD this evening. Discussed code status, family is very clear that they wish for full code.    Eliseo Gum MSN, AGACNP-BC [place] [phone] If no answer, [phone] 04/17/2020, 11:12 PM

## 2020-04-17 NOTE — Progress Notes (Signed)
UPDATE NOTE  Spoke with Dr. Doylene Canard regarding increased frequency of VTach runs. He is recommending 1x bolus of amiodarone 150mg  IV. Order placed.  [name], MD  04/17/20  12:11 PM

## 2020-04-18 ENCOUNTER — Other Ambulatory Visit: Payer: Self-pay | Admitting: Physical Medicine and Rehabilitation

## 2020-04-18 DIAGNOSIS — J9602 Acute respiratory failure with hypercapnia: Secondary | ICD-10-CM

## 2020-04-18 DIAGNOSIS — I472 Ventricular tachycardia: Secondary | ICD-10-CM | POA: Diagnosis not present

## 2020-04-18 DIAGNOSIS — N186 End stage renal disease: Secondary | ICD-10-CM | POA: Diagnosis not present

## 2020-04-18 DIAGNOSIS — J810 Acute pulmonary edema: Secondary | ICD-10-CM | POA: Diagnosis not present

## 2020-04-18 DIAGNOSIS — J9601 Acute respiratory failure with hypoxia: Secondary | ICD-10-CM | POA: Diagnosis not present

## 2020-04-18 MED ORDER — CHLORHEXIDINE GLUCONATE CLOTH 2 % EX PADS: 6.0000 | MEDICATED_PAD | Freq: Every day | CUTANEOUS | Status: DC

## 2020-04-18 MED ORDER — MIDAZOLAM HCL 2 MG/2ML IJ SOLN
1.0000 mg | Freq: Once | INTRAMUSCULAR | Status: AC
  Administered 2020-04-18: 1 mg via INTRAVENOUS

## 2020-04-18 MED ORDER — MIDAZOLAM HCL 2 MG/2ML IJ SOLN
INTRAMUSCULAR | Status: AC
  Filled 2020-04-18: qty 2

## 2020-04-18 NOTE — TOC Transition Note (Addendum)
Transition of Care [facility]) - CM/SW Discharge Note   Patient Details  Name: [name] [name] MRN: [id number] Date of Birth: 07-Mar-1966  Transition of Care [facility]) CM/SW Contact:  Maryclare Labrador, RN Phone Number: 04/18/2020, 4:11 PM   Clinical Narrative:    CM acknowledges consult for home with hospice.  CM was able to camera in to pts 75M room to discuss home with hospice option with the pt and his family at bedside.  CM introduced herself and explained home with hospice option. CM offered hospice choice per medicare.gov - pt/family chose [facility].  Pt informed CM that he is already on 2 liters continuous oxygen supplied by [employer] -  Per attending pt will need 5 liters at discharge.  Pt informed CM that he plans to discharge home today with or without equipment set up by hospice including oxygen. CM made referral with Maryann with [facility], CM communicated pt's urgency to get home today if possible.  Maryanne confirmed with [employer] that pt's current home concentrator has 5 liter continuous flow capacity.   Maryanne to follow up with CM regarding if pt is accepted to hospice.  CM communicated with [facility] that will also need hospital bed and 3:1 in addition to home oxygen.    Update 1630:  :  [facility] requests confirmation regarding continuation or discontinuation of HD - in order for pt to be accepted by hospice pt/family will have to agree that pt will no longer continue with HD.    [facility] liaison spoke with pt's father via phone (father at pts bedside).   This evening and will follow up with pt and family in the am.  Attending aware and plans to move forward with home with hospice   discharge tomorrow once discharge plan is finalized.   Per bedside nurse pt and family in agreement with discharge tomorrow     Final next level of care: Home w Hospice Care     Patient Goals and CMS Choice   CMS Medicare.gov Compare Post Acute Care list provided to:: Patient Choice offered to / list  presented to : Patient, Adult Children, Parent(significant other)  Discharge Placement                       Discharge Plan and Services                            [place] Date [place]: 04/18/20 Time [place]: [place] Representative spoke with at [place]: [place] ([place]) Interventions     Readmission Risk Interventions No flowsheet data found.

## 2020-04-18 NOTE — Consult Note (Addendum)
Ref: Jean Rosenthal, MD   Subjective:  Resting comfortably. Monitor shows sinus rhythm with few VPCs.  Echocardiogram shows moderate to severe LV systolic dysfunction without pericardial effusion. Moderate RV systolic dysfunction.  Appreciate renal consult and treatment for fluid overload.  Objective:  Vital Signs in the last 24 hours: Temp:  [97.6 F (36.4 C)-98.9 F (37.2 C)] 98 F (36.7 C) (05/05 1102) Pulse Rate:  [64-121] 106 (05/05 1000) Cardiac Rhythm: Atrial fibrillation (05/05 0800) Resp:  [10-28] 18 (05/05 1103) BP: (90-153)/(48-139) 105/57 (05/05 1103) SpO2:  [71 %-100 %] 80 % (05/05 1000) FiO2 (%):  [40 %] 40 % (05/05 0000) Weight:  [68.7 kg-68.8 kg] 68.7 kg (05/04 2315)  Physical Exam: BP Readings from Last 1 Encounters:  04/18/20 (!) 105/57     Wt Readings from Last 1 Encounters:  04/17/20 68.7 kg    Weight change: 3.6 kg Body mass index is 22.37 kg/m. HEENT: [place]/AT, Eyes-Brown, PERL, EOMI, Conjunctiva-Pink, Sclera-Non-icteric Neck: No JVD, No bruit, Trachea midline. Lungs:  Clear, Bilateral. Cardiac:  Regular rhythm, normal S1 and S2, no S3. II/VI systolic murmur. Abdomen:  Soft, non-tender. BS present. Extremities:  No edema present. No cyanosis. No clubbing. CNS: AxOx1, Cranial nerves grossly intact.  Skin: Warm and dry.   Intake/Output from previous day: 05/04 0701 - 05/05 0700 In: 549.3 [P.O.:240; I.V.:172; IV Piggyback:107.3] Out: 625     Lab Results: BMET    Component Value Date/Time   NA 134 (L) 04/17/2020 2122   NA 132 (L) 04/17/2020 0816   NA 135 04/16/2020 0615   K 4.1 04/17/2020 2122   K 4.5 04/17/2020 0816   K 4.9 04/16/2020 0615   CL 92 (L) 04/17/2020 2122   CL 93 (L) 04/17/2020 0816   CL 92 (L) 04/16/2020 0615   CO2 27 04/17/2020 2122   CO2 24 04/17/2020 0816   CO2 27 04/16/2020 0615   GLUCOSE 100 (H) 04/17/2020 2122   GLUCOSE 115 (H) 04/17/2020 0816   GLUCOSE 90 04/16/2020 0615   BUN 59 (H) 04/17/2020 2122   BUN 72 (H)  04/17/2020 0816   BUN 66 (H) 04/16/2020 0615   CREATININE 5.18 (H) 04/17/2020 2122   CREATININE 5.75 (H) 04/17/2020 0816   CREATININE 6.31 (H) 04/16/2020 0615   CALCIUM 8.1 (L) 04/17/2020 2122   CALCIUM 8.4 (L) 04/17/2020 0816   CALCIUM 9.0 04/16/2020 0615   CALCIUM 8.6 (L) 12/23/2019 0323   CALCIUM 8.2 (L) 10/21/2010 1420   CALCIUM 8.7 06/24/2009 0542   GFRNONAA 12 (L) 04/17/2020 2122   GFRNONAA 10 (L) 04/17/2020 0816   GFRNONAA 9 (L) 04/16/2020 0615   GFRAA 14 (L) 04/17/2020 2122   GFRAA 12 (L) 04/17/2020 0816   GFRAA 11 (L) 04/16/2020 0615   CBC    Component Value Date/Time   WBC 10.4 04/17/2020 0826   RBC 3.87 (L) 04/17/2020 0826   HGB 11.4 (L) 04/17/2020 0826   HGB 12.3 (L) 12/31/2017 1012   HCT 39.1 04/17/2020 0826   HCT 37.4 (L) 12/31/2017 1012   PLT 192 04/17/2020 0826   PLT 176 12/31/2017 1012   MCV 101.0 (H) 04/17/2020 0826   MCV 99 (H) 12/31/2017 1012   MCH 29.5 04/17/2020 0826   MCHC 29.2 (L) 04/17/2020 0826   RDW 24.7 (H) 04/17/2020 0826   RDW 18.8 (H) 12/31/2017 1012   LYMPHSABS 1.0 04/15/2020 1018   LYMPHSABS 1.0 12/31/2017 1012   MONOABS 1.2 (H) 04/15/2020 1018   EOSABS 0.1 04/15/2020 1018   EOSABS  0.1 12/31/2017 1012   BASOSABS 0.1 04/15/2020 1018   BASOSABS 0.0 12/31/2017 1012   HEPATIC Function Panel Recent Labs    02/05/20 1748 02/20/20 1201 04/13/20 1044  PROT 7.3 7.3 6.4*   HEMOGLOBIN A1C No components found for: HGA1C,  MPG CARDIAC ENZYMES Lab Results  Component Value Date   CKTOTAL 520 (H) 07/16/2012   CKMB 10.7 (HH) 07/16/2012   TROPONINI 0.22 (HH) 11/13/2017   TROPONINI 0.19 (HH) 11/12/2017   TROPONINI 0.26 (HH) 05/19/2017   BNP No results for input(s): PROBNP in the last 8760 hours. TSH Recent Labs    11/16/19 1730 12/25/19 0350 04/15/20 1018  TSH 7.112* 6.933* 7.508*   CHOLESTEROL No results for input(s): CHOL in the last 8760 hours.  Scheduled Meds: . amiodarone  200 mg Oral BID  . apixaban  2.5 mg Oral BID  .  [START ON 04/19/2020] calcitRIOL  0.75 mcg Oral Q T,Th,Sa-HD  . calcium acetate  1,334 mg Oral TID WC  . Chlorhexidine Gluconate Cloth  6 each Topical Q0600  . docusate sodium  100 mg Oral Daily  . feeding supplement (PRO-STAT SUGAR FREE 64)  30 mL Oral TID BM  . folic acid  1 mg Oral Daily  . levothyroxine  25 mcg Oral QAC breakfast  . mouth rinse  15 mL Mouth Rinse BID  . metoprolol tartrate  2.5 mg Intravenous Q6H  . midodrine  10 mg Oral TID WC  . multivitamin  1 tablet Oral QHS  . mupirocin ointment  1 application Nasal BID  . povidone-iodine   Topical BID  . predniSONE  10 mg Oral Q breakfast  . pregabalin  50 mg Oral QHS  . sodium chloride flush  3 mL Intravenous Once  . thiamine  100 mg Oral Daily  . vitamin C  250 mg Oral BID   Continuous Infusions: . sodium chloride 10 mL/hr at 04/18/20 1100  . norepinephrine (LEVOPHED) Adult infusion Stopped (04/17/20 2200)   PRN Meds:.acetaminophen **OR** acetaminophen, heparin, lidocaine (PF), oxyCODONE-acetaminophen, prochlorperazine  Assessment/Plan: Dilated non-ischemic cardiomyopathy Acute on chronic left ventricular systolic failure, HFrEF Chronic RV systolic failure Dilated LA and RA Mild MR Moderate TR COPD ESRD Paroxysmal atrial fibrillation Non sustained VT H/O PE H/O bilateral feet wounds and gangrene S/P PEA cardiac arrest  Continue medical treatment/   LOS: 5 days   Time spent including chart review, lab review, examination, discussion with patient :25  min   Dixie Dials  MD  04/18/2020, 2:01 PM

## 2020-04-18 NOTE — Progress Notes (Signed)
Called to patient's bedside to discuss goals of care.  Patient and 2 family was including his adult son at bedside.  Reviewed that he has end-stage renal disease and end-stage heart failure.  He is not able to tolerate dialysis successfully as an outpatient.  He has 2 essentially terminal diagnoses.  I did relay to the patient that he is going to die at some point from both of these.  Patient is adamant on wanting to go home.  I do not believe this patient has the ability to make his own medical decisions.  He does not have good insight on his complicated medical issues.  However I do feel his family understands.  We reviewed that if he were to go home without hospice or palliative care services as family would end up having to call 911 and he would come right back to the ICU which is where he does not want to be.  He is taking off telemetry leads, pulse oximetry and oxygen.  He is refusing care.  Family seems to understand that [name] is going to die whether to the hospital or at home.  I discussed with him that going home would require additional services and to allow me to let him get home with the support of hospice.  Realistically this will be arranged by tomorrow morning.  I have let him know that going home with hospice means letting his body die natural death and not continuing dialysis.  He is currently still a full code.

## 2020-04-18 NOTE — Progress Notes (Signed)
UPDATE NOTE:  Paged by bedside RN regarding increased somulence and diaphoresis.  Myself and Dr. Jeryl Columbia assessed pt at bedside.  Pt's father at bedside. Pt appearing significant more ill than previous. Severely encephalopathic/somulent. VSS. Repeated episodes of Vtach that is now occurring 1-2x/min.  Physical exam Gen: critically ill appearing Lungs: non-rebreather in place. Diminished lung sounds bilaterally. Cardiac: nonpalpable radial pulses. Faint brachial pulses. Irregular rhythm. Tachycardic rate. Extremities cool. Abd: severely distended. Bs active Neuro: solmulent. Opens eyes to voice and occasionally will shake or nod his head.   Assessment: suspect this is a result of progression of his heart and renal failure. Unable to remove volume at HD yesterday due to hypotension. While in the room, O2 sats continued to drop and he was placed on BiPAP. RT was also called for obtaining an ABG however were unsuccessful. BP began dropping to 80s-90s/40s-60s Critical care consulted and evaluated pt at bedside. Will admit pt to ICU. Called nephrology>Dr. Jonnie Finner came to bedside noting that pt will likely need CRRT with possible pressor support.  I had a fairly lengthy conversation with pt's father and son. Discussed that he is high risk for cardiac arrest and that it is unlikely he would recover well if that happened. Father notes that he is unable to makes decisions for pt and that son is primary caregiver. Son did briefly inquire about the possibility of pt returning home "to live out his days". We discussed that it is unlikely that he would have sufficient reserve for meaningful recovery. Additionally, given the underlying issue, cardiac arrest carries high risk for progression of his heart failure  Plan: Crit care agrees to transfer to ICU--appreciate their assistance. Please feel free to reach out for any questions or when pt is ready to transfer out of ICU  [name],  MD 04/17/20 1600

## 2020-04-18 NOTE — Progress Notes (Signed)
Hydrologist [facility]) Hospital Liaison: RN note     Notified by Transition of Care Manger, Elenor Quinones, RN of patient/family request for [facility] services at home after discharge. Chart and patient information under review by [facility] physician. Hospice eligibility pending currently.     Writer spoke with patient's father to initiate education related to hospice philosophy, services and team approach to care. [name] verbalized understanding of information given. Per discussion, plan is for discharge to home by private car on 5/6.  Patient will need prescriptions for discharge comfort medications.   DME needs have been discussed, patient currently has the following equipment in the home: oxygen and W/C.  Patient/family requests the following DME for delivery to the home:  Oxygen and hospital bed. [place] equipment manager has been notified and will contact DME provider to arrange delivery to the home. Home address has been verified and is correct in the chart.  [name] is the family member to contact to arrange time of delivery.      [facility] aware of the above. Please notify [facility] when patient is ready to leave the unit at discharge. (Call [phone] or [phone] after 5pm.) [facility] information and contact numbers given to [name].       Please call with any hospice related questions.      Thank you for this referral.     Farrel Gordon, RN, [facility] (listed on [place] under [place])   [phone]

## 2020-04-18 NOTE — Progress Notes (Signed)
Reading Progress Note Patient Name: [name] [name] DOB: 11-03-1966 MRN: [id number]   Date of Service  04/18/2020  HPI/Events of Note  Agitation   eICU Interventions  Will order: 1. Versed 1 mg IV X 1 now.      Intervention Category Major Interventions: Delirium, psychosis, severe agitation - evaluation and management  [name] 04/18/2020, 5:46 AM

## 2020-04-18 NOTE — Progress Notes (Signed)
NAME:  [name] [name], MRN:  [id number], DOB:  [birth year], LOS: 5 ADMISSION DATE:  04/13/2020, CONSULTATION DATE:  04/13/20 REFERRING MD:  Langston Masker - EM, CHIEF COMPLAINT:  SOB  Brief History   54 yo presenting with worsening SOB, hypoxia. ESRD pt who has been recommended for extra runs of iHD outpatient but refuses. CXR with bilateral pleural effusions, pulmonary edema.   History of present illness   54 yo M PMH ESRD on T/R/Sa HD, HTN, CHF, PE and  Afib (now on eliquis), prior cardiac arrest with prolonged hospitalization (12/20-2/21), prior tracheostomy status who now wears 2LNC at baseline, who presented to [facility] 4/30 with worsening SOB. WOB has been progressive x 2weeks, and is worse when laying flat thus causing sleep disturbances. Today, patient was referred to seek medical attn by PT who noted pt was SOB. Presented to IM clinic  who noted patient was somnolent, hypoxic, SOB and referred to ED. Prior to presentation, patient tried increasing home O2 to 2.5L without significant improvement. Denied cough, wheezing, chest pain, unilateral extremity swelling. Endorsed mild BLE edema.  In ED, pt SpO2 96% on 2.5L with increased respiratory effort. CXR with pulmonary edema, bilateral pleural effusions. In ED, worsening hypoxia requiring increasing O2 support, to BiPAP (briefly attempted, pt refused), before arriving at 10L HFNC.   Has has since been admitted to the hospitalist service and has undergone HD x 2, however, sessions have been limited by AMS and hypotension. He underwent R thoracentesis for pleual effusion with 451mL out.  Felt to be due to volume overload. Then on 5/4 his course became complicated by NSVT. Cardiology was consulted and increased his amiodarone dose along with an IV bolus. As the day progressed he became progressively dyspneic, hypoxemic, and encephalopathic. He was placed on BiPAP, but level of consciousness did not improve. PCCM was consulted.    Past Medical History  Cardiac  arrest ESRD HTN CHF Polysubstance use disorder PE Afib  NICM  Prolonged qtc RLS Anemia Acute on chronic respiratory failure requiring tracheostomy, now decannulated   Significant Hospital Events   4/30 presents to ED with SOB, hypoxia. Found to be volume overloaded. Wishes to leave AMA but ultimately agrees to stay  Consults:  Nephrology PCCM   Procedures:    Significant Diagnostic Tests:  CXR 4/30> bilateral pleural effusions. Pulmonary edema, vascular congestion. Bibasilar atelectasis.  ECho 5/4 > LVEF 30-35%. LV global HK. RVSF moderately reduced. Severe dilation of RA.   Micro Data:  4/30 SARS Cov2> neg 4/30 Flu A/B> neg   Antimicrobials:    Interim history/subjective:  Not in acute distress.  Currently off BiPAP.  Objective   Blood pressure (!) 110/93, pulse 81, temperature 98 F (36.7 C), temperature source Axillary, resp. rate (!) 26, weight 68.7 kg, SpO2 98 %.    FiO2 (%):  [40 %] 40 %   Intake/Output Summary (Last 24 hours) at 04/18/2020 0944 Last data filed at 04/18/2020 0700 Gross per 24 hour  Intake 309.25 ml  Output 625 ml  Net -315.75 ml   Filed Weights   04/17/20 0559 04/17/20 2023 04/17/20 2315  Weight: 67.3 kg 68.8 kg 68.7 kg    Examination:  General: Somewhat stunned male he is able to vocalize. HEENT: Mild JVD is appreciated Neuro: Somewhat stunned in appearance but can follow commands CV: Heart sounds are distant PULM: Decreased breath sounds in the bases Utilize BiPAP during the night GI: soft, bsx4 active   Extremities: Right lower extremity status post transmetatarsal resection Skin: Multiple  areas of breakdown   Resolved Hospital Problem list     Assessment & Plan:   Acute respiratory failure with hypoxia and hypercarbia Bilateral Pleural Effusions: s/p thoracentesis 4/30. Felt to be due to volume overload.  Pulmonary edema -in setting of ESRD with volume overload, he has been unable to fully tolerate HD.   P Currently  intensive care unit  BiPAP although intermittently he refuses He is at risk for intubation Hemodialysis should improve pleural effusions Consider palliation   ESRD T/R/Sa iHD P Per nephrology hopefully we continue intermittent hemodialysis as blood pressure allows. Continue midodrine May need temporary hemodialysis catheter continue to evaluate daily Consider palliative medicine consult    NSVT: Amiodarone per cardiology Appreciate cardiology's input Monitor electrolytes   Hx Afib Hx PE Hx pericardial effusion P Cardiac monitoring Continue Eliquis Continue home prednisone if hypotensive may utilize stress steroids  Best practice:  Diet: NPO Pain/Anxiety/Delirium protocol (if indicated): na VAP protocol (if indicated): na DVT prophylaxis: Eliquis GI prophylaxis: na  Glucose control: monitor  Mobility: PT  Code Status: Full  Family Communication: Patient's son and daughter at length at bedside.  They are conflicted over patient's wishes given he went through so much since December and dont wish to see him suffer.  For now, they wish to continue all aggressive measures.  Update daily Disposition: ICU, critically ill.   Labs   CBC: Recent Labs  Lab 04/13/20 1044 04/13/20 1225 04/15/20 1018 04/16/20 0615 04/17/20 0826  WBC 11.1*  --  11.7* 12.1* 10.4  NEUTROABS 8.8*  --  9.2*  --   --   HGB 12.7* 16.3 12.8* 13.1 11.4*  HCT 44.9 48.0 43.7 45.3 39.1  MCV 102.7*  --  100.9* 101.6* 101.0*  PLT 221  --  196 198 106    Basic Metabolic Panel: Recent Labs  Lab 04/13/20 1044 04/13/20 1044 04/13/20 1225 04/15/20 1018 04/16/20 0615 04/17/20 0816 04/17/20 2122  NA 140   < > 137 135 135 132* 134*  K 4.3   < > 4.3 4.3 4.9 4.5 4.1  CL 93*  --   --  93* 92* 93* 92*  CO2 31  --   --  28 27 24 27   GLUCOSE 118*  --   --  109* 90 115* 100*  BUN 36*  --   --  37* 66* 72* 59*  CREATININE 5.81*  --   --  4.73* 6.31* 5.75* 5.18*  CALCIUM 8.7*  --   --  9.0 9.0 8.4* 8.1*   MG  --   --   --   --   --  2.2 2.4  PHOS  --   --   --   --   --  7.5* 6.7*   < > = values in this interval not displayed.   GFR: Estimated Creatinine Clearance: 16.4 mL/min (A) (by C-G formula based on SCr of 5.18 mg/dL (H)). Recent Labs  Lab 04/13/20 1044 04/15/20 1018 04/16/20 0615 04/17/20 0826  WBC 11.1* 11.7* 12.1* 10.4    Liver Function Tests: Recent Labs  Lab 04/13/20 1044 04/17/20 0816 04/17/20 2122  AST 16  --   --   ALT 19  --   --   ALKPHOS 124  --   --   BILITOT 0.8  --   --   PROT 6.4*  --   --   ALBUMIN 3.2* 3.1* 3.4*   No results for input(s): LIPASE, AMYLASE in the last 168 hours. No  results for input(s): AMMONIA in the last 168 hours.  ABG    Component Value Date/Time   PHART 7.373 01/26/2020 1123   PCO2ART 48.0 01/26/2020 1123   PO2ART 73.7 (L) 01/26/2020 1123   HCO3 32.6 (H) 04/13/2020 1225   TCO2 35 (H) 04/13/2020 1225   ACIDBASEDEF 0.5 01/19/2020 1508   O2SAT 99.0 04/13/2020 1225     Coagulation Profile: No results for input(s): INR, PROTIME in the last 168 hours.  Cardiac Enzymes: No results for input(s): CKTOTAL, CKMB, CKMBINDEX, TROPONINI in the last 168 hours.  HbA1C: Hgb A1c MFr Bld  Date/Time Value Ref Range Status  12/25/2019 03:50 AM 4.7 (L) 4.8 - 5.6 % Final    Comment:    (NOTE) Pre diabetes:          5.7%-6.4% Diabetes:              >6.4% Glycemic control for   <7.0% adults with diabetes   12/02/2019 05:27 AM 6.2 (H) 4.8 - 5.6 % Final    Comment:    (NOTE) Pre diabetes:          5.7%-6.4% Diabetes:              >6.4% Glycemic control for   <7.0% adults with diabetes     CBG: Recent Labs  Lab 04/17/20 1556  GLUCAP 131*    CCT:   [place] Amethyst Gainer ACNP Acute Care Nurse Practitioner [place] Please consult [place] 04/18/2020, 9:45 AM

## 2020-04-18 NOTE — Progress Notes (Signed)
Patient ID: [name] [name], male   DOB: December 22, 1965, 54 y.o.   MRN: [id number] [facility] Progress Note   Assessment/ Plan:   1.  Shortness of breath: bilat effusions and some degree of pulm edema. Tolerated large UF x 1st 2 HD sessions, but not tolerating much UF the last 2 HD sessions. Getting bipap prn.  2.  Vtach - weren't able to complete HD yest due to VT 3.  HFrEF - EF 30-35% 4.  Atrial fib - on eliquis and amio po 5.  Hx of pericardial effusion - on home prednisone 6.  ESRD:  usual HD TTS. Marginal CRRT candidate. Will continue to do HD if pt's BP and cardiac rhythm will tolerate.  Poor prognosis overall. Would support transition to comfort care, have d/w pt's son.  7.  BP/volume: soft BP's , on midodrine 10 tid. Up several kg over dry 8.  Anemia:  Hemoglobin and hematocrit currently at goal without ESA.  9.  Metabolic bone disease:  resume phoslo for phos 7.5, Ca okay at 8.4. calcitriol for PTH control. 10. Nutrition:   Improving appetite/weight following his prolonged discharge from the hospital, reevaluate EDW.    Kelly Splinter, MD 04/18/2020, 1:13 PM    Subjective:   HD shortened last night d/t VT   Objective:   BP (!) 105/57   Pulse (!) 106   Temp 98 F (36.7 C) (Oral)   Resp 18   Wt 68.7 kg   SpO2 (!) 80%   BMI 22.37 kg/m    OP HD: [place] TTS  4h 39min  61.5kg  450/1.5  P4  2/2 bath  AVF 15g  Hep 5000   - Calcitriol 0.34mcg PO q HD   - Calcium acetate 3 tab TID   Physical Exam: Gen: Appears chronically ill, nasal O2, no distress, some ^wob CVS: Pulse regular rhythm, normal rate, S1 and S2 normal Resp: Diminished breath sounds over bases with fine rales right base. Abd: Soft, obese, nontender Ext: 1+ bilat ankle edema, no hip or dependent edema Neuro: NF, Ox 3  Labs: BMET Recent Labs  Lab 04/13/20 1044 04/13/20 1225 04/15/20 1018 04/16/20 0615 04/17/20 0816 04/17/20 2122  NA 140 137 135 135 132* 134*  K 4.3 4.3 4.3 4.9 4.5 4.1  CL 93*   --  93* 92* 93* 92*  CO2 31  --  28 27 24 27   GLUCOSE 118*  --  109* 90 115* 100*  BUN 36*  --  37* 66* 72* 59*  CREATININE 5.81*  --  4.73* 6.31* 5.75* 5.18*  CALCIUM 8.7*  --  9.0 9.0 8.4* 8.1*  PHOS  --   --   --   --  7.5* 6.7*   CBC Recent Labs  Lab 04/13/20 1044 04/13/20 1044 04/13/20 1225 04/15/20 1018 04/16/20 0615 04/17/20 0826  WBC 11.1*  --   --  11.7* 12.1* 10.4  NEUTROABS 8.8*  --   --  9.2*  --   --   HGB 12.7*   < > 16.3 12.8* 13.1 11.4*  HCT 44.9   < > 48.0 43.7 45.3 39.1  MCV 102.7*  --   --  100.9* 101.6* 101.0*  PLT 221  --   --  196 198 192   < > = values in this interval not displayed.      Medications:    . amiodarone  200 mg Oral BID  . apixaban  2.5 mg Oral BID  . [START ON 04/19/2020]  calcitRIOL  0.75 mcg Oral Q T,Th,Sa-HD  . calcium acetate  1,334 mg Oral TID WC  . Chlorhexidine Gluconate Cloth  6 each Topical Q0600  . docusate sodium  100 mg Oral Daily  . feeding supplement (PRO-STAT SUGAR FREE 64)  30 mL Oral TID BM  . folic acid  1 mg Oral Daily  . levothyroxine  25 mcg Oral QAC breakfast  . mouth rinse  15 mL Mouth Rinse BID  . metoprolol tartrate  2.5 mg Intravenous Q6H  . midodrine  10 mg Oral TID WC  . multivitamin  1 tablet Oral QHS  . mupirocin ointment  1 application Nasal BID  . povidone-iodine   Topical BID  . predniSONE  10 mg Oral Q breakfast  . pregabalin  50 mg Oral QHS  . sodium chloride flush  3 mL Intravenous Once  . thiamine  100 mg Oral Daily  . vitamin C  250 mg Oral BID

## 2020-04-18 NOTE — Progress Notes (Signed)
Assisted tele visit to patient with case management.  Thomos Domine, Aliene Beams, RN

## 2020-04-18 NOTE — Progress Notes (Signed)
Pt. Taking medical equipment off and states he "wants to go home". I explained to the patient that it would take some time to get things in place for him to go home safely, however he refused to wear tele monitor and BP cuff. Explained to the patient that if he left now then he would be leaving AMA and he stated he, "wanted to go home and be with his family and be comfortable". Dr. Shearon Stalls paged to come to bedside to talk with patient and his family about what his options are as far as goals of care go.

## 2020-04-19 ENCOUNTER — Other Ambulatory Visit: Payer: Self-pay | Admitting: Physical Medicine and Rehabilitation

## 2020-04-19 ENCOUNTER — Inpatient Hospital Stay (HOSPITAL_COMMUNITY): Payer: Medicare Other

## 2020-04-19 ENCOUNTER — Telehealth: Payer: Self-pay | Admitting: *Deleted

## 2020-04-19 DIAGNOSIS — J9622 Acute and chronic respiratory failure with hypercapnia: Secondary | ICD-10-CM | POA: Diagnosis not present

## 2020-04-19 DIAGNOSIS — J9621 Acute and chronic respiratory failure with hypoxia: Secondary | ICD-10-CM | POA: Diagnosis not present

## 2020-04-19 LAB — BASIC METABOLIC PANEL
Anion gap: 17 — ABNORMAL HIGH (ref 5–15)
BUN: 72 mg/dL — ABNORMAL HIGH (ref 6–20)
CO2: 24 mmol/L (ref 22–32)
Calcium: 8.4 mg/dL — ABNORMAL LOW (ref 8.9–10.3)
Chloride: 91 mmol/L — ABNORMAL LOW (ref 98–111)
Creatinine, Ser: 6.11 mg/dL — ABNORMAL HIGH (ref 0.61–1.24)
GFR calc Af Amer: 11 mL/min — ABNORMAL LOW (ref >60)
GFR calc non Af Amer: 10 mL/min — ABNORMAL LOW (ref >60)
Glucose, Bld: 98 mg/dL (ref 70–99)
Potassium: 5.5 mmol/L — ABNORMAL HIGH (ref 3.5–5.1)
Sodium: 132 mmol/L — ABNORMAL LOW (ref 135–145)

## 2020-04-19 LAB — MAGNESIUM: Magnesium: 2.7 mg/dL — ABNORMAL HIGH (ref 1.7–2.4)

## 2020-04-19 LAB — CALCIUM, IONIZED: Calcium, Ionized, Serum: 4.2 mg/dL — ABNORMAL LOW (ref 4.5–5.6)

## 2020-04-19 LAB — PHOSPHORUS: Phosphorus: 8.2 mg/dL — ABNORMAL HIGH (ref 2.5–4.6)

## 2020-04-19 NOTE — Consult Note (Signed)
Ref: Jean Rosenthal, MD   Subjective:  Awake. Sitting up. Patient is now comfort care and waiting for hospice at home. Moitor : Sinus rhythm.  Objective:  Vital Signs in the last 24 hours: Temp:  [96.5 F (35.8 C)-97.7 F (36.5 C)] 97.7 F (36.5 C) (05/06 1221) Pulse Rate:  [79-99] 99 (05/06 1100) Cardiac Rhythm: Atrial fibrillation (05/06 0818) Resp:  [11-25] 14 (05/06 1100) BP: (97-146)/(69-115) 112/83 (05/06 1100) SpO2:  [92 %-99 %] 92 % (05/06 1100)  Physical Exam: BP Readings from Last 1 Encounters:  04/19/20 112/83     Wt Readings from Last 1 Encounters:  04/17/20 68.7 kg    Weight change:  Body mass index is 22.37 kg/m. HEENT: [place]/AT, Eyes-Brown, PERL, EOMI, Conjunctiva-Pink, Sclera-Non-icteric Neck: No JVD, No bruit, Trachea midline. Lungs:  Clearing, Bilateral. Cardiac:  Regular rhythm, normal S1 and S2, no S3. II/VI systolic murmur. Abdomen:  Soft, non-tender. BS present. Extremities:  1 + edema present. No cyanosis. No clubbing. Chronic bilateral feet wounds. CNS: AxOx1, Cranial nerves grossly intact, moves all 4 extremities.  Skin: Warm and dry.   Intake/Output from previous day: 05/05 0701 - 05/06 0700 In: 209.8 [I.V.:209.8] Out: -     Lab Results: BMET    Component Value Date/Time   NA 132 (L) 04/19/2020 0328   NA 134 (L) 04/17/2020 2122   NA 132 (L) 04/17/2020 0816   K 5.5 (H) 04/19/2020 0328   K 4.1 04/17/2020 2122   K 4.5 04/17/2020 0816   CL 91 (L) 04/19/2020 0328   CL 92 (L) 04/17/2020 2122   CL 93 (L) 04/17/2020 0816   CO2 24 04/19/2020 0328   CO2 27 04/17/2020 2122   CO2 24 04/17/2020 0816   GLUCOSE 98 04/19/2020 0328   GLUCOSE 100 (H) 04/17/2020 2122   GLUCOSE 115 (H) 04/17/2020 0816   BUN 72 (H) 04/19/2020 0328   BUN 59 (H) 04/17/2020 2122   BUN 72 (H) 04/17/2020 0816   CREATININE 6.11 (H) 04/19/2020 0328   CREATININE 5.18 (H) 04/17/2020 2122   CREATININE 5.75 (H) 04/17/2020 0816   CALCIUM 8.4 (L) 04/19/2020 0328   CALCIUM 8.1  (L) 04/17/2020 2122   CALCIUM 8.4 (L) 04/17/2020 0816   CALCIUM 8.6 (L) 12/23/2019 0323   CALCIUM 8.2 (L) 10/21/2010 1420   CALCIUM 8.7 06/24/2009 0542   GFRNONAA 10 (L) 04/19/2020 0328   GFRNONAA 12 (L) 04/17/2020 2122   GFRNONAA 10 (L) 04/17/2020 0816   GFRAA 11 (L) 04/19/2020 0328   GFRAA 14 (L) 04/17/2020 2122   GFRAA 12 (L) 04/17/2020 0816   CBC    Component Value Date/Time   WBC 10.4 04/17/2020 0826   RBC 3.87 (L) 04/17/2020 0826   HGB 11.4 (L) 04/17/2020 0826   HGB 12.3 (L) 12/31/2017 1012   HCT 39.1 04/17/2020 0826   HCT 37.4 (L) 12/31/2017 1012   PLT 192 04/17/2020 0826   PLT 176 12/31/2017 1012   MCV 101.0 (H) 04/17/2020 0826   MCV 99 (H) 12/31/2017 1012   MCH 29.5 04/17/2020 0826   MCHC 29.2 (L) 04/17/2020 0826   RDW 24.7 (H) 04/17/2020 0826   RDW 18.8 (H) 12/31/2017 1012   LYMPHSABS 1.0 04/15/2020 1018   LYMPHSABS 1.0 12/31/2017 1012   MONOABS 1.2 (H) 04/15/2020 1018   EOSABS 0.1 04/15/2020 1018   EOSABS 0.1 12/31/2017 1012   BASOSABS 0.1 04/15/2020 1018   BASOSABS 0.0 12/31/2017 1012   HEPATIC Function Panel Recent Labs    02/05/20 1748  02/20/20 1201 04/13/20 1044  PROT 7.3 7.3 6.4*   HEMOGLOBIN A1C No components found for: HGA1C,  MPG CARDIAC ENZYMES Lab Results  Component Value Date   CKTOTAL 520 (H) 07/16/2012   CKMB 10.7 (HH) 07/16/2012   TROPONINI 0.22 (HH) 11/13/2017   TROPONINI 0.19 (HH) 11/12/2017   TROPONINI 0.26 (HH) 05/19/2017   BNP No results for input(s): PROBNP in the last 8760 hours. TSH Recent Labs    11/16/19 1730 12/25/19 0350 04/15/20 1018  TSH 7.112* 6.933* 7.508*   CHOLESTEROL No results for input(s): CHOL in the last 8760 hours.  Scheduled Meds: . amiodarone  200 mg Oral BID  . apixaban  2.5 mg Oral BID  . calcitRIOL  0.75 mcg Oral Q T,Th,Sa-HD  . calcium acetate  1,334 mg Oral TID WC  . Chlorhexidine Gluconate Cloth  6 each Topical Q0600  . docusate sodium  100 mg Oral Daily  . feeding supplement (PRO-STAT  SUGAR FREE 64)  30 mL Oral TID BM  . folic acid  1 mg Oral Daily  . levothyroxine  25 mcg Oral QAC breakfast  . mouth rinse  15 mL Mouth Rinse BID  . metoprolol tartrate  2.5 mg Intravenous Q6H  . midodrine  10 mg Oral TID WC  . multivitamin  1 tablet Oral QHS  . povidone-iodine   Topical BID  . predniSONE  10 mg Oral Q breakfast  . pregabalin  50 mg Oral QHS  . sodium chloride flush  3 mL Intravenous Once  . thiamine  100 mg Oral Daily  . vitamin C  250 mg Oral BID   Continuous Infusions: . sodium chloride 10 mL/hr at 04/19/20 0800  . 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 Reconsult as needed.   LOS: 6 days   Time spent including chart review, lab review, examination, discussion with patient and family : 25  min   Dixie Dials  MD  04/19/2020, 2:51 PM

## 2020-04-19 NOTE — Progress Notes (Signed)
Patients father given DNR form and discharge instructions. Hospice set up for home meeting at 6:30 tonight. Patient placed on his home o2 with no questions or concerns. Will take out to car via [place].

## 2020-04-19 NOTE — Progress Notes (Addendum)
NAME:  [name] [name], MRN:  [id number], DOB:  09/22/1966, LOS: 6 ADMISSION DATE:  04/13/2020, CONSULTATION DATE:  04/13/20 REFERRING MD:  Langston Masker - EM, CHIEF COMPLAINT:  SOB  Brief History   54 yo presenting with worsening SOB, hypoxia. ESRD pt who has been recommended for extra runs of iHD outpatient but refuses. CXR with bilateral pleural effusions, pulmonary edema.   History of present illness   54 yo M PMH ESRD on T/R/Sa HD, HTN, CHF, PE and  Afib (now on eliquis), prior cardiac arrest with prolonged hospitalization (12/20-2/21), prior tracheostomy status who now wears 2LNC at baseline, who presented to [facility]30 with worsening SOB. WOB has been progressive x 2weeks, and is worse when laying flat thus causing sleep disturbances. Today, patient was referred to seek medical attn by PT who noted pt was SOB. Presented to IM clinic  who noted patient was somnolent, hypoxic, SOB and referred to ED. Prior to presentation, patient tried increasing home O2 to 2.5L without significant improvement. Denied cough, wheezing, chest pain, unilateral extremity swelling. Endorsed mild BLE edema.  In ED, pt SpO2 96% on 2.5L with increased respiratory effort. CXR with pulmonary edema, bilateral pleural effusions. In ED, worsening hypoxia requiring increasing O2 support, to BiPAP (briefly attempted, pt refused), before arriving at 10L HFNC.   Has has since been admitted to the hospitalist service and has undergone HD x 2, however, sessions have been limited by AMS and hypotension. He underwent R thoracentesis for pleual effusion with 447m out.  Felt to be due to volume overload. Then on 5/4 his course became complicated by NSVT. Cardiology was consulted and increased his amiodarone dose along with an IV bolus. As the day progressed he became progressively dyspneic, hypoxemic, and encephalopathic. He was placed on BiPAP, but level of consciousness did not improve. PCCM was consulted.    Past Medical History  Cardiac  arrest ESRD HTN CHF Polysubstance use disorder PE Afib  NICM  Prolonged qtc RLS Anemia Acute on chronic respiratory failure requiring tracheostomy, now decannulated   Significant Hospital Events   4/30 presents to ED with SOB, hypoxia. Found to be volume overloaded. Wishes to leave AMA but ultimately agrees to stay  Consults:  Nephrology PCCM   Procedures:    Significant Diagnostic Tests:  CXR 4/30> bilateral pleural effusions. Pulmonary edema, vascular congestion. Bibasilar atelectasis.  ECho 5/4 > LVEF 30-35%. LV global HK. RVSF moderately reduced. Severe dilation of RA.   Micro Data:  4/30 SARS Cov2> neg 4/30 Flu A/B> neg   Antimicrobials:    Interim history/subjective:  Plan is for discharge home today with home hospice.  Unfortunately he is currently full code.  I had a discussion with the father at the bedside at 0 968he is protocol daughter and son reiterate the possibility of changing to a DNR prior to discharge home  Objective   Blood pressure 120/75, pulse 84, temperature (!) 97.5 F (36.4 C), temperature source Oral, resp. rate 15, weight 68.7 kg, SpO2 96 %.        Intake/Output Summary (Last 24 hours) at 04/19/2020 0911 Last data filed at 04/19/2020 0800 Gross per 24 hour  Intake 229.77 ml  Output -  Net 229.77 ml   Filed Weights   04/17/20 0559 04/17/20 2023 04/17/20 2315  Weight: 67.3 kg 68.8 kg 68.7 kg    Examination:  General: Frail male who is poorly responsive HEENT: Positive JVD Neuro: Lethargic CV: Heart sounds are distant PULM: Decreased in the bases  Abd: soft, bsx4 active   Extremities: Right metatarsal wound weeping Skin: Positive edema multiple areas of    Resolved Hospital Problem list     Assessment & Plan:   Acute respiratory failure with hypoxia and hypercarbia Bilateral Pleural Effusions: s/p thoracentesis 4/30. Felt to be due to volume overload.  Pulmonary edema -in setting of ESRD with volume overload, he has  been unable to fully tolerate HD.   P Plan is for discharge home today with home hospice    ESRD T/R/Sa iHD P Per nephrology HD not available due to inability to tolerate.   NSVT: Amiodarone per cardiology   Hx Afib Hx PE Hx pericardial effusion P Continue Eliquis Home prednisone  Best practice:  Diet: NPO Pain/Anxiety/Delirium protocol (if indicated): na VAP protocol (if indicated): na DVT prophylaxis: Eliquis GI prophylaxis: na  Glucose control: monitor  Mobility: PT  Code Status: Full  Family Communication: MD met with family 04/18/2020 discussed with patient is to going home with home hospice.  The patient has poor insight of his illness.  The family does want to bring him home with home hospice.Marland Kitchen  Unfortunately he remains a full code at this time discussions with family.  04/19/2020 ongoing discussion with father is Botox to patient the daughter concerning changing to a DNR status.  Await input from family. Disposition: 04/19/2020 plan is to discharge home today with home hospice.  04/19/2020 all family at bedside.  They are adamant about full CODE STATUS on discharge.  Labs   CBC: Recent Labs  Lab 04/13/20 1044 04/13/20 1225 04/15/20 1018 04/16/20 0615 04/17/20 0826  WBC 11.1*  --  11.7* 12.1* 10.4  NEUTROABS 8.8*  --  9.2*  --   --   HGB 12.7* 16.3 12.8* 13.1 11.4*  HCT 44.9 48.0 43.7 45.3 39.1  MCV 102.7*  --  100.9* 101.6* 101.0*  PLT 221  --  196 198 197    Basic Metabolic Panel: Recent Labs  Lab 04/15/20 1018 04/16/20 0615 04/17/20 0816 04/17/20 2122 04/19/20 0328  NA 135 135 132* 134* 132*  K 4.3 4.9 4.5 4.1 5.5*  CL 93* 92* 93* 92* 91*  CO2 28 27 24 27 24   GLUCOSE 109* 90 115* 100* 98  BUN 37* 66* 72* 59* 72*  CREATININE 4.73* 6.31* 5.75* 5.18* 6.11*  CALCIUM 9.0 9.0 8.4* 8.1* 8.4*  MG  --   --  2.2 2.4 2.7*  PHOS  --   --  7.5* 6.7* 8.2*   GFR: Estimated Creatinine Clearance: 13.9 mL/min (A) (by C-G formula based on SCr of 6.11 mg/dL  (H)). Recent Labs  Lab 04/13/20 1044 04/15/20 1018 04/16/20 0615 04/17/20 0826  WBC 11.1* 11.7* 12.1* 10.4    Liver Function Tests: Recent Labs  Lab 04/13/20 1044 04/17/20 0816 04/17/20 2122  AST 16  --   --   ALT 19  --   --   ALKPHOS 124  --   --   BILITOT 0.8  --   --   PROT 6.4*  --   --   ALBUMIN 3.2* 3.1* 3.4*   No results for input(s): LIPASE, AMYLASE in the last 168 hours. No results for input(s): AMMONIA in the last 168 hours.  ABG    Component Value Date/Time   PHART 7.373 01/26/2020 1123   PCO2ART 48.0 01/26/2020 1123   PO2ART 73.7 (L) 01/26/2020 1123   HCO3 32.6 (H) 04/13/2020 1225   TCO2 35 (H) 04/13/2020 1225   ACIDBASEDEF 0.5 01/19/2020  1508   O2SAT 99.0 04/13/2020 1225     Coagulation Profile: No results for input(s): INR, PROTIME in the last 168 hours.  Cardiac Enzymes: No results for input(s): CKTOTAL, CKMB, CKMBINDEX, TROPONINI in the last 168 hours.  HbA1C: Hgb A1c MFr Bld  Date/Time Value Ref Range Status  12/25/2019 03:50 AM 4.7 (L) 4.8 - 5.6 % Final    Comment:    (NOTE) Pre diabetes:          5.7%-6.4% Diabetes:              >6.4% Glycemic control for   <7.0% adults with diabetes   12/02/2019 05:27 AM 6.2 (H) 4.8 - 5.6 % Final    Comment:    (NOTE) Pre diabetes:          5.7%-6.4% Diabetes:              >6.4% Glycemic control for   <7.0% adults with diabetes     CBG: Recent Labs  Lab 04/17/20 1556  GLUCAP 131*    CCT:   [place]  ACNP Acute Care Nurse Practitioner Maryanna Shape Pulmonary/Critical Care Please consult [place] 04/19/2020, 9:11 AM

## 2020-04-19 NOTE — Progress Notes (Signed)
Manufacturing engineer [facility]) Hospital Liaison note  This RN met with pt, father [name], and daughter at bedside to continue education on hospice services at home after discharge. All are in agreement that pt will dc HD at this time; pt desires to be home for EOL.  Pt has been accepted for services with [facility] and is set up with an admission appt this evening a 630pm.  Hospital bed with over bed table, O2 concentrator up to 10L/min and 3:1 are set to be delivered around 1pm per [name].  Plan at this time is for pt to dc home via private vehicle today.  This RN coordinated this plan of care with CM Sam.  Please provide prescriptions at discharge as needed to ensure ongoing symptom management.    [facility] information and contact numbers given to both father [name] and daughter.  Above information shared with TOC CM Sam. Please do not hesitate to call with any questions or concerns.  Thank you for the opportunity to participate in this pt's care.  Domenic Moras, BSN, [place]  [phone] (24h on call)

## 2020-04-19 NOTE — Progress Notes (Signed)
Patient ID: [name] [name], male   DOB: 09-24-1966, 54 y.o.   MRN: [id number] [facility] Progress Note   Assessment/ Plan:   1.  Shortness of breath: bilat effusions and some degree of pulm edema. Tolerated large UF x 1st 2 HD sessions, but not tolerating UF the last 2 HD sessions. Getting bipap prn. Pt now is going w/ home hospice, no further dialysis. Will sign off.  2.  Vtach - weren't able to complete HD 5/4 due to VT 3.  HFrEF - EF 30-35% 4.  Atrial fib - on eliquis and amio po 5.  Hx of pericardial effusion - on home prednisone 6.  ESRD:  usual HD TTS. As above, no further HD.  7.  BP/volume: soft BP's , on midodrine 10 tid   Kelly Splinter, MD 04/19/2020, 12:57 PM    Subjective:   No new issues, going home today w/ hospice   Objective:   BP 112/83   Pulse 99   Temp 97.7 F (36.5 C) (Oral)   Resp 14   Wt 68.7 kg   SpO2 92%   BMI 22.37 kg/m    OP HD: [place] TTS  4h 56min  61.5kg  450/1.5  P4  2/2 bath  AVF 15g  Hep 5000   - Calcitriol 0.64mcg PO q HD   - Calcium acetate 3 tab TID   Physical Exam: Gen: Appears chronically ill, nasal O2, no distress, some ^wob CVS: Pulse regular rhythm, normal rate, S1 and S2 normal Resp: Diminished breath sounds over bases with fine rales right base. Abd: Soft, obese, nontender Ext: 1+ bilat ankle edema, no hip or dependent edema Neuro: NF, Ox 3  Labs: BMET Recent Labs  Lab 04/13/20 1044 04/13/20 1225 04/15/20 1018 04/16/20 0615 04/17/20 0816 04/17/20 2122 04/19/20 0328  NA 140 137 135 135 132* 134* 132*  K 4.3 4.3 4.3 4.9 4.5 4.1 5.5*  CL 93*  --  93* 92* 93* 92* 91*  CO2 31  --  28 27 24 27 24   GLUCOSE 118*  --  109* 90 115* 100* 98  BUN 36*  --  37* 66* 72* 59* 72*  CREATININE 5.81*  --  4.73* 6.31* 5.75* 5.18* 6.11*  CALCIUM 8.7*  --  9.0 9.0 8.4* 8.1* 8.4*  PHOS  --   --   --   --  7.5* 6.7* 8.2*   CBC Recent Labs  Lab 04/13/20 1044 04/13/20 1044 04/13/20 1225 04/15/20 1018 04/16/20 0615  04/17/20 0826  WBC 11.1*  --   --  11.7* 12.1* 10.4  NEUTROABS 8.8*  --   --  9.2*  --   --   HGB 12.7*   < > 16.3 12.8* 13.1 11.4*  HCT 44.9   < > 48.0 43.7 45.3 39.1  MCV 102.7*  --   --  100.9* 101.6* 101.0*  PLT 221  --   --  196 198 192   < > = values in this interval not displayed.      Medications:    . amiodarone  200 mg Oral BID  . apixaban  2.5 mg Oral BID  . calcitRIOL  0.75 mcg Oral Q T,Th,Sa-HD  . calcium acetate  1,334 mg Oral TID WC  . Chlorhexidine Gluconate Cloth  6 each Topical Q0600  . docusate sodium  100 mg Oral Daily  . feeding supplement (PRO-STAT SUGAR FREE 64)  30 mL Oral TID BM  . folic acid  1 mg Oral Daily  .  levothyroxine  25 mcg Oral QAC breakfast  . mouth rinse  15 mL Mouth Rinse BID  . metoprolol tartrate  2.5 mg Intravenous Q6H  . midodrine  10 mg Oral TID WC  . multivitamin  1 tablet Oral QHS  . povidone-iodine   Topical BID  . predniSONE  10 mg Oral Q breakfast  . pregabalin  50 mg Oral QHS  . sodium chloride flush  3 mL Intravenous Once  . thiamine  100 mg Oral Daily  . vitamin C  250 mg Oral BID

## 2020-04-19 NOTE — TOC Progression Note (Signed)
Transition of Care [facility]) - Progression Note    Patient Details  Name: [name] [name] MRN: [id number] Date of Birth: Mar 26, 1966  Transition of Care [facility]) CM/SW Contact  Audel Coakley, Abelino Derrick, RN Phone Number: 04/19/2020, 10:05 AM  Clinical Narrative:   CM spoke with [place] liaison this am.  Liaison will meet pt and family at bedside today at 11am  Update:  [facility] has accepted pt for home with hospice.  Home equipment to be delivered during 1 o'clock hour. [facility] is scheduled for home visit today at 6:30pm.   Pts family plans to take pt via private vehicle - pt's father confirms he has adequate supply of portable oxygen for ride home.  No other CM needs - CM signing off          Expected Discharge Plan and [place] Date [place]: 04/18/20 Time [place]: 1538 Representative spoke with at [place]: [place] ([place]) Interventions    Readmission Risk Interventions No flowsheet data found.

## 2020-04-19 NOTE — Telephone Encounter (Signed)
Received call from [facility] at [facility] stating patient will be discharged from hospital today or tomorrow with home hospice. Requesting Provider who will be signing comfort care orders. Relayed Dr. Nelia Shi info. Hubbard Hartshorn, BSN, RN-BC

## 2020-04-19 NOTE — Discharge Summary (Signed)
Physician Discharge Summary  Patient ID: [name] [name] MRN: [id number] DOB/AGE: 52-29-1967 52 y.o.  Admit date: 04/13/2020 Discharge date: 04/19/2020  Problem List Active Problems:   Acute respiratory failure (HCC)   Acute on chronic respiratory failure (HCC)  HPI: Chief Complaint: Shortness of Breath  History of Present Illness:  [name] [name] is a 54 year old male with a past medical history significant for hypertension, congestive heart failure, previous cardiac arrest requiring extended hospitalization (11/2019-01/2020), chronic hypoxic respiratory failure, pulmonary embolism, end-stage renal disease on dialysis, prolonged QT interval, right wrist fracture, hypocalcemia, and atrial fibrillation on Eliquis who presented to the emergency department with shortness of breath. [name] (caregiver) was bedside and contributed to the past medical history.  [name] [name] was seen on Wednesday by physical therapy who recommend that he be seen due to worsening shortness of breath and increases in his home oxygen therapy to 2.5L (2.0L baseline). Patient and his father reports the shortness of breath has worsened over the past two weeks. Patient is difficulty speaking in complete sentence without having to take a breath. Patient reports significant orthopnea, without any chest pain or chest tightness. He says that this is causing sleep disturbances and that he has to sleep upright in his chair ro with his head elevated.   On ROS, patient denies any fever, lightheadedness, chills. Patient reports that he has noticed peripheral edema in his lower extremities and some abdominal distention. He also states that he has a chronic cough that produces "yellow" sputum.  Chest X-ray was obtained in the emergency department, which revealed bilateral pleural effusions and cardiomegaly with pulmonary vascular congestion. While in the emergency department, the patient's hypoxia worsened requiring increased O2  support. Patient refused BiPAP, but O2 was able to be maintained at 10L on high-flow nasal canula.  Interventional radiology consulted for aspiration of pleural effusions in the emergency department. Nephrology was consulted for hemodialysis in the emergency department.   Hospital Course:   Assessment & Plan:   Acute respiratory failure with hypoxia and hypercarbia Bilateral Pleural Effusions: s/p thoracentesis 4/30. Felt to be due to volume overload.  Pulmonary edema -in setting of ESRD with volume overload, he has been unable to fully tolerate HD.   P Discharge to home with home hospice Goal of care is now comfort Full DNR    ESRD T/R/Sa iHD P Per nephrology HD not available due to inability to tolerate. Goal of care is comfort    NSVT: Amiodarone    Hx Afib Hx PE Hx pericardial effusion P Continue Eliquis   Best practice:  Diet: NPO Pain/Anxiety/Delirium protocol (if indicated): na VAP protocol (if indicated): na DVT prophylaxis: Eliquis GI prophylaxis: na  Glucose control: monitor  Mobility: PT  Code Status: Full  Family Communication: MD met with family 04/18/2020 discussed with patient is to going home with home hospice.  The patient has poor insight of his illness.  The family does want to bring him home with home hospice.Marland Kitchen  Unfortunately he remains a full code at this time discussions with family.  04/19/2020 ongoing discussion with father is Botox to patient the daughter concerning changing to a DNR status.  Await input from family. Disposition: 04/19/2020 plan is to discharge home today with home hospice.  04/19/2020 all family at bedside.  They are adamant about full CODE STATUS on discharge.  04/19/2020 1254 hrs. patient has been made a DNR per Dr. Lake Bells will be discharged home to home hospice without hemodialysis.  Home hospice will follow  and is on medications as needed for comfort   Labs at discharge Lab Results  Component Value Date    CREATININE 6.11 (H) 04/19/2020   BUN 72 (H) 04/19/2020   NA 132 (L) 04/19/2020   K 5.5 (H) 04/19/2020   CL 91 (L) 04/19/2020   CO2 24 04/19/2020   Lab Results  Component Value Date   WBC 10.4 04/17/2020   HGB 11.4 (L) 04/17/2020   HCT 39.1 04/17/2020   MCV 101.0 (H) 04/17/2020   PLT 192 04/17/2020   Lab Results  Component Value Date   ALT 19 04/13/2020   AST 16 04/13/2020   ALKPHOS 124 04/13/2020   BILITOT 0.8 04/13/2020   Lab Results  Component Value Date   INR 1.6 (H) 11/16/2019   INR 1.5 (H) 11/16/2019   INR 1.36 10/27/2018    Current radiology studies DG Chest Port 1 View  Result Date: 04/19/2020 CLINICAL DATA:  Respiratory failure. EXAM: PORTABLE CHEST 1 VIEW COMPARISON:  Apr 17, 2020. FINDINGS: Stable cardiomegaly. No pneumothorax is noted. Moderate right pleural effusion is noted with increased right upper lobe opacity concerning for edema or pneumonia. Stable left perihilar and basilar opacity is noted concerning for atelectasis or infiltrate. Bony thorax is unremarkable. IMPRESSION: Stable moderate right pleural effusion is noted with increased right upper lobe opacity concerning for edema or pneumonia. Stable left perihilar and basilar opacity is noted concerning for atelectasis or infiltrate. Electronically Signed   By: Marijo Conception M.D.   On: 04/19/2020 07:59    Disposition:  Discharge to home hospice.  Goal of care is comfort.  Hospice will arrange for medications for dyspnea and anxiety.   Allergies as of 04/19/2020      Reactions   Losartan Cough, Anaphylaxis, Other (See Comments)      Medication List    STOP taking these medications   amitriptyline 10 MG tablet Commonly known as: ELAVIL   apixaban 2.5 MG Tabs tablet Commonly known as: ELIQUIS   dextromethorphan-guaiFENesin 30-600 MG 12hr tablet Commonly known as: MUCINEX DM   predniSONE 10 MG tablet Commonly known as: DELTASONE     TAKE these medications   acetaminophen 325 MG  tablet Commonly known as: TYLENOL Take 1-2 tablets (325-650 mg total) by mouth every 4 (four) hours as needed for mild pain.   amiodarone 200 MG tablet Commonly known as: PACERONE Take 1 tablet (200 mg total) by mouth daily.   benztropine 1 MG tablet Commonly known as: COGENTIN Take 1 tablet (1 mg total) by mouth 2 (two) times daily.   Calcium Acetate 668 (169 Ca) MG Tabs Take 1 tablet by mouth daily.   colchicine 0.6 MG tablet Take 1 tablet (0.6 mg total) by mouth daily.   docusate sodium 100 MG capsule Commonly known as: Colace Take 1 capsule (100 mg total) by mouth daily.   folic acid 1 MG tablet Commonly known as: FOLVITE Take 1 tablet (1 mg total) by mouth daily.   levothyroxine 25 MCG tablet Commonly known as: SYNTHROID Take 1 tablet (25 mcg total) by mouth daily at 6 (six) AM.   melatonin 3 MG Tabs tablet Take 1 tablet (3 mg total) by mouth at bedtime. To help with sleep   midodrine 10 MG tablet Commonly known as: PROAMATINE Take 1 tablet (10 mg total) by mouth 3 (three) times daily with meals.   multivitamin Tabs tablet Take 1 tablet by mouth at bedtime.   oxyCODONE-acetaminophen 7.5-325 MG tablet Commonly known as: Percocet Take  1 tablet by mouth every 4 (four) hours as needed for severe pain.   pregabalin 50 MG capsule Commonly known as: LYRICA Take 1 capsule (50 mg total) by mouth at bedtime.   prochlorperazine 5 MG tablet Commonly known as: COMPAZINE Take 1 tablet (5 mg total) by mouth every 6 (six) hours as needed for nausea or vomiting.   senna-docusate 8.6-50 MG tablet Commonly known as: Senokot-S Take 1 tablet by mouth at bedtime.   thiamine 100 MG tablet Take 1 tablet (100 mg total) by mouth daily.   traMADol 50 MG tablet Commonly known as: ULTRAM Take 1 tablet (50 mg total) by mouth every 6 (six) hours as needed for moderate pain or severe pain.   vitamin C 250 MG tablet Commonly known as: ASCORBIC ACID Take 1 tablet (250 mg total) by  mouth 2 (two) times daily.         Discharged Condition: poor  Time spent on discharge  40 minutes.  Vital signs at Discharge. Temp:  [96.5 F (35.8 C)-97.7 F (36.5 C)] 97.7 F (36.5 C) (05/06 1221) Pulse Rate:  [79-99] 99 (05/06 1100) Resp:  [11-25] 14 (05/06 1100) BP: (97-150)/(69-120) 112/83 (05/06 1100) SpO2:  [92 %-99 %] 92 % (05/06 1100) Office follow up Special Information or instructions. Signed: Richardson Landry Chanae Gemma ACNP Acute Care Nurse Practitioner [place] Please consult [place] 04/19/2020, 12:57 PM

## 2020-04-20 NOTE — Telephone Encounter (Signed)
Hi Helen,  I have been following his progression. Regarding the comfort care orders, I was under the impression that the [facility] signs them.   Thanks.

## 2020-04-22 IMAGING — DX DG ABDOMEN 1V
1 series · 1 of 1 positions shown · non-contrast
Comparison: 12/05/2019

CLINICAL DATA: NG tube placement.  Abdominal distension.

EXAM:
ABDOMEN - 1 VIEW

[abdomen kub]
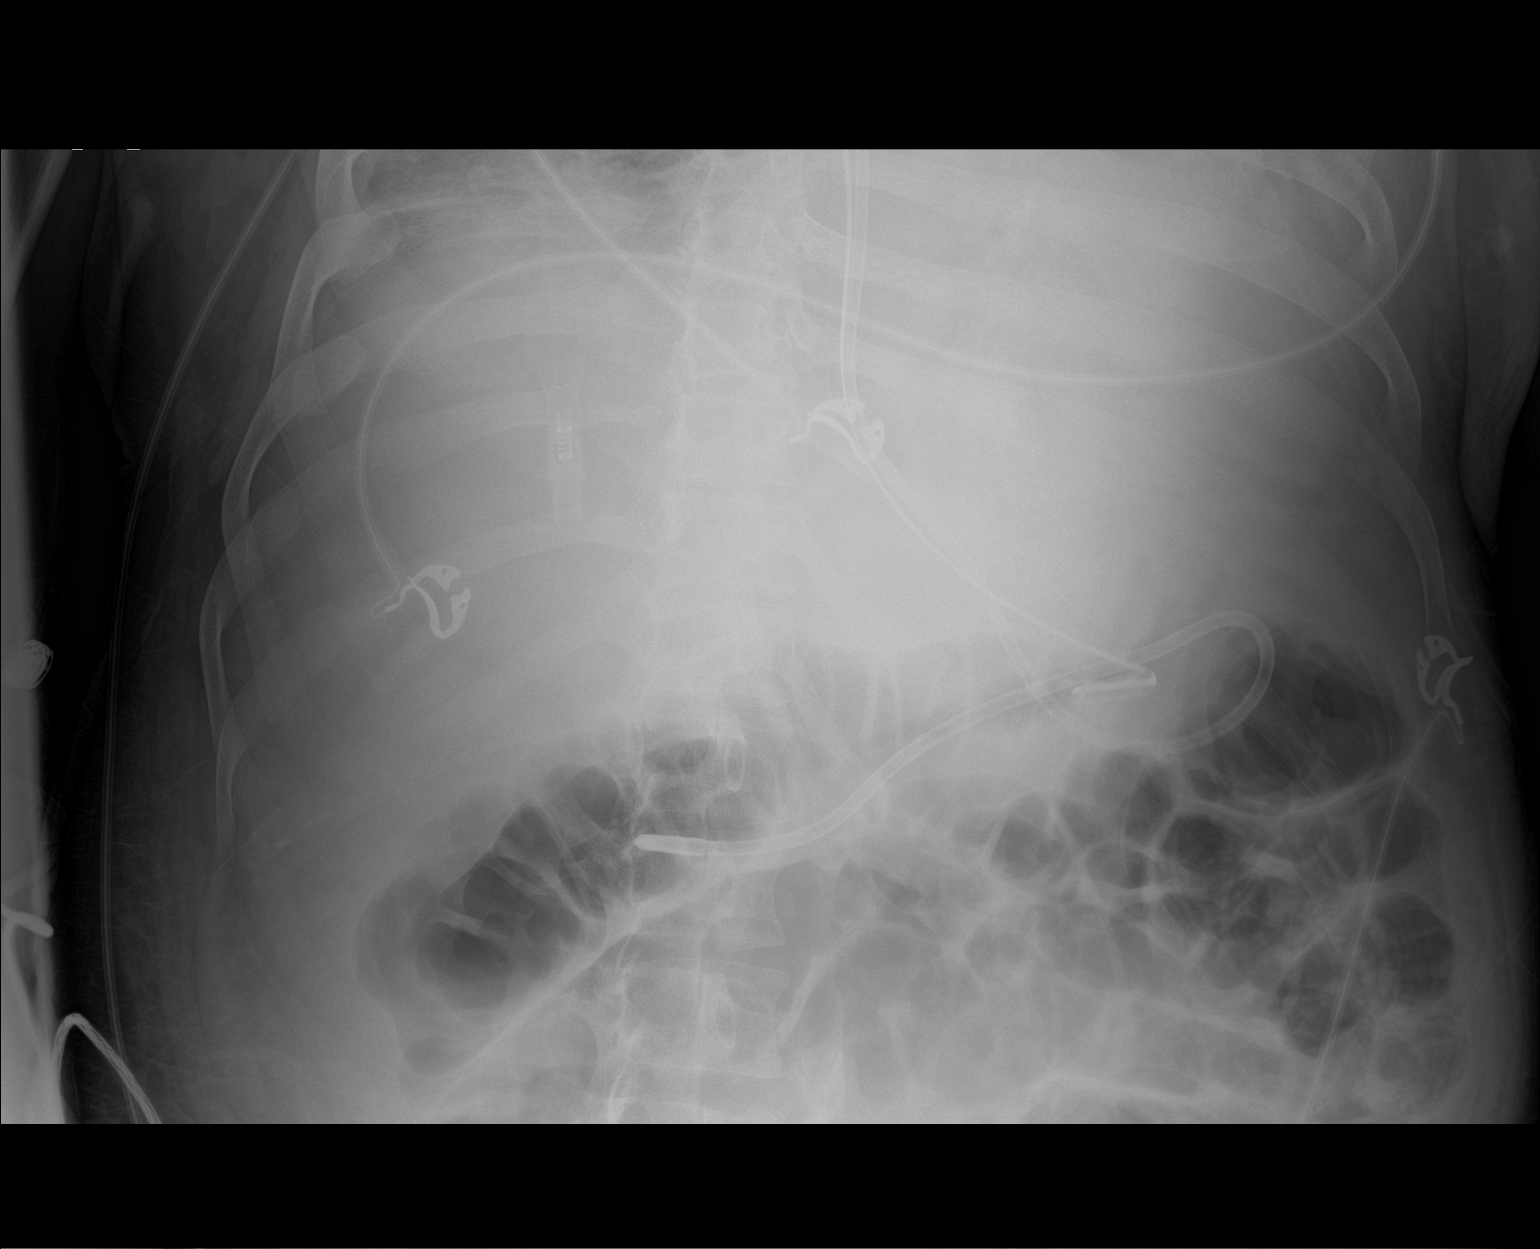

[1 of 1 positions shown; findings below may reference images not displayed]

FINDINGS: Nasogastric tube passes below the diaphragm, tip projecting in the
expected location of the distal stomach or first portion of the
duodenum.

There is increased bowel gas without significant bowel distension.
IMPRESSION: 1. Nasogastric tube tip projects in either the distal stomach or
first portion of the duodenum

## 2020-04-22 IMAGING — DX DG CHEST 1V PORT
1 series · 1 of 1 positions shown · non-contrast
Comparison: 12/14/2019

CLINICAL DATA: Abdominal distension

EXAM:
PORTABLE CHEST 1 VIEW

[chest ap]
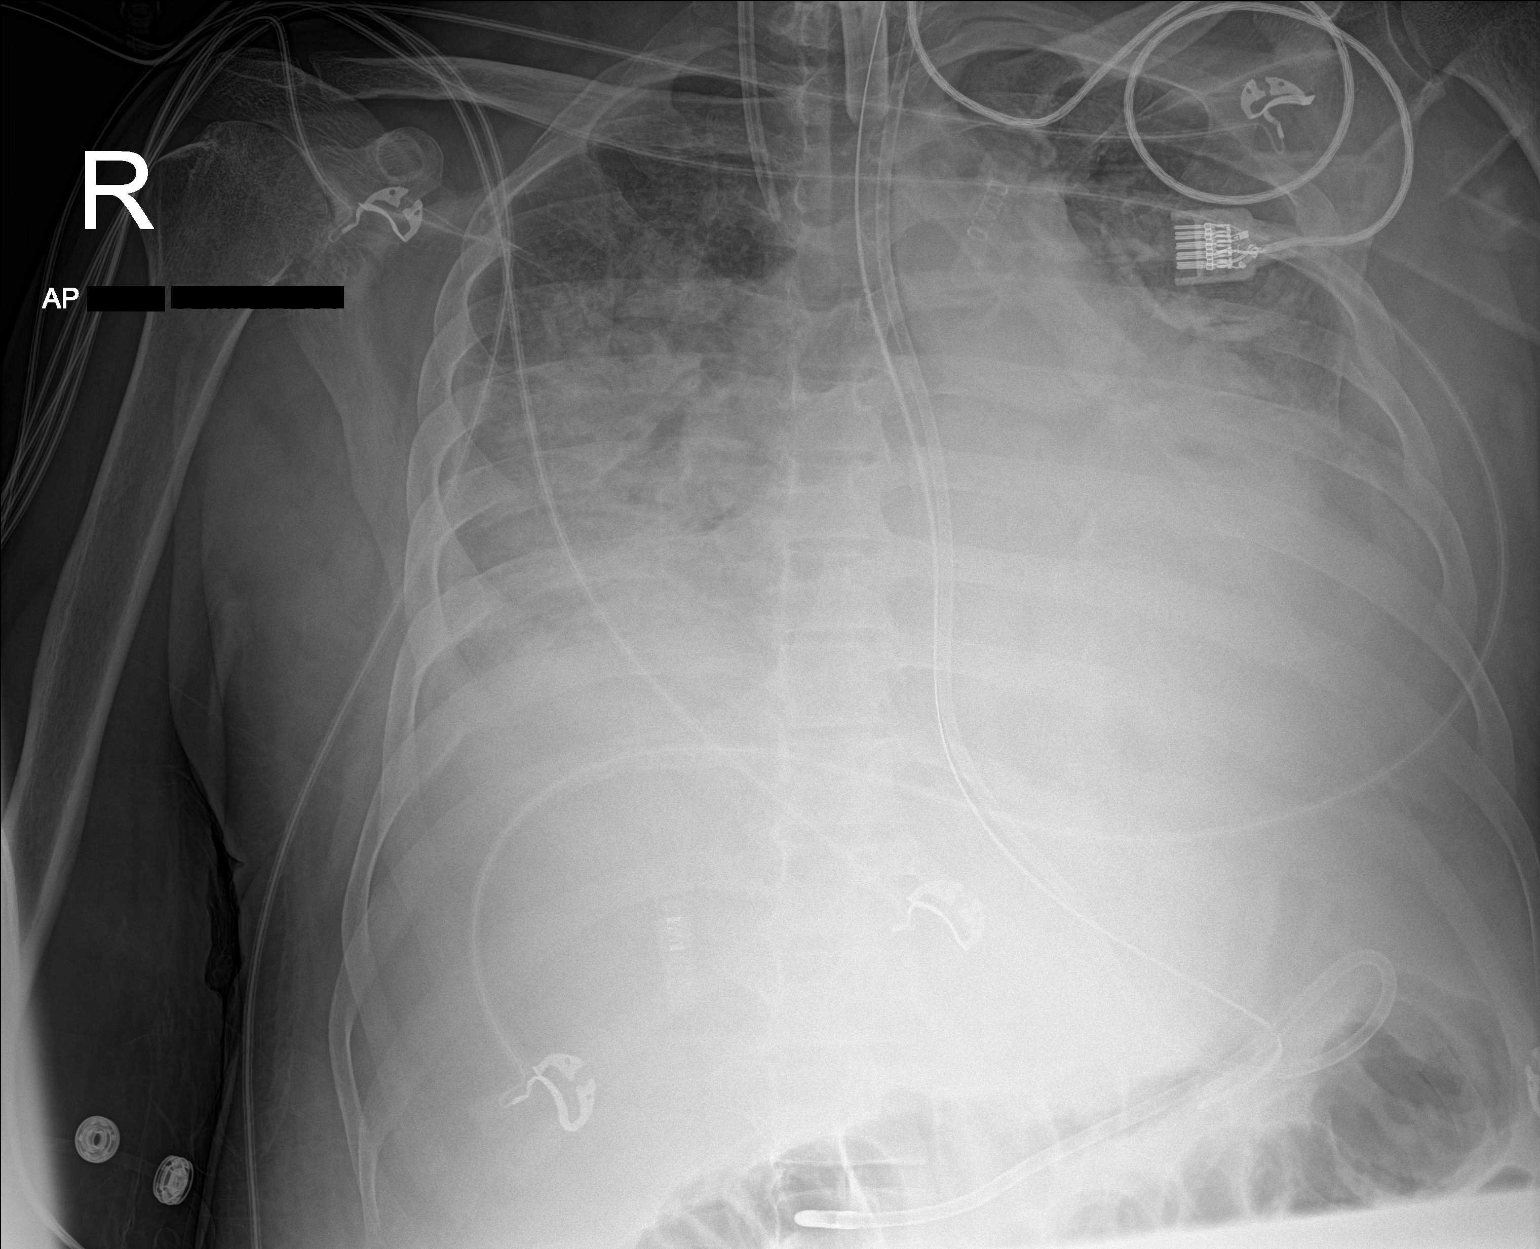

[1 of 1 positions shown; findings below may reference images not displayed]

FINDINGS: There has been interval placement of esophagogastric tube, tip and
side port below the diaphragm. Other support apparatus including
tracheostomy remain unchanged. No other significant change in AP
portable examination with extensive heterogeneous airspace opacity,
layering bilateral pleural effusions, and cardiomegaly.
IMPRESSION: 1. There has been interval placement of esophagogastric tube, tip
and side port below the diaphragm.
2. Other support apparatus including tracheostomy remain unchanged.
3. No other significant change in AP portable examination with
extensive heterogeneous airspace opacity, layering bilateral pleural
effusions, and cardiomegaly.

## 2020-04-23 IMAGING — DX DG CHEST 1V PORT
1 series · 1 of 1 positions shown · non-contrast
Comparison: 12/15/19

CLINICAL DATA: Follow-up pulmonary edema.

EXAM:
PORTABLE CHEST 1 VIEW

[chest]
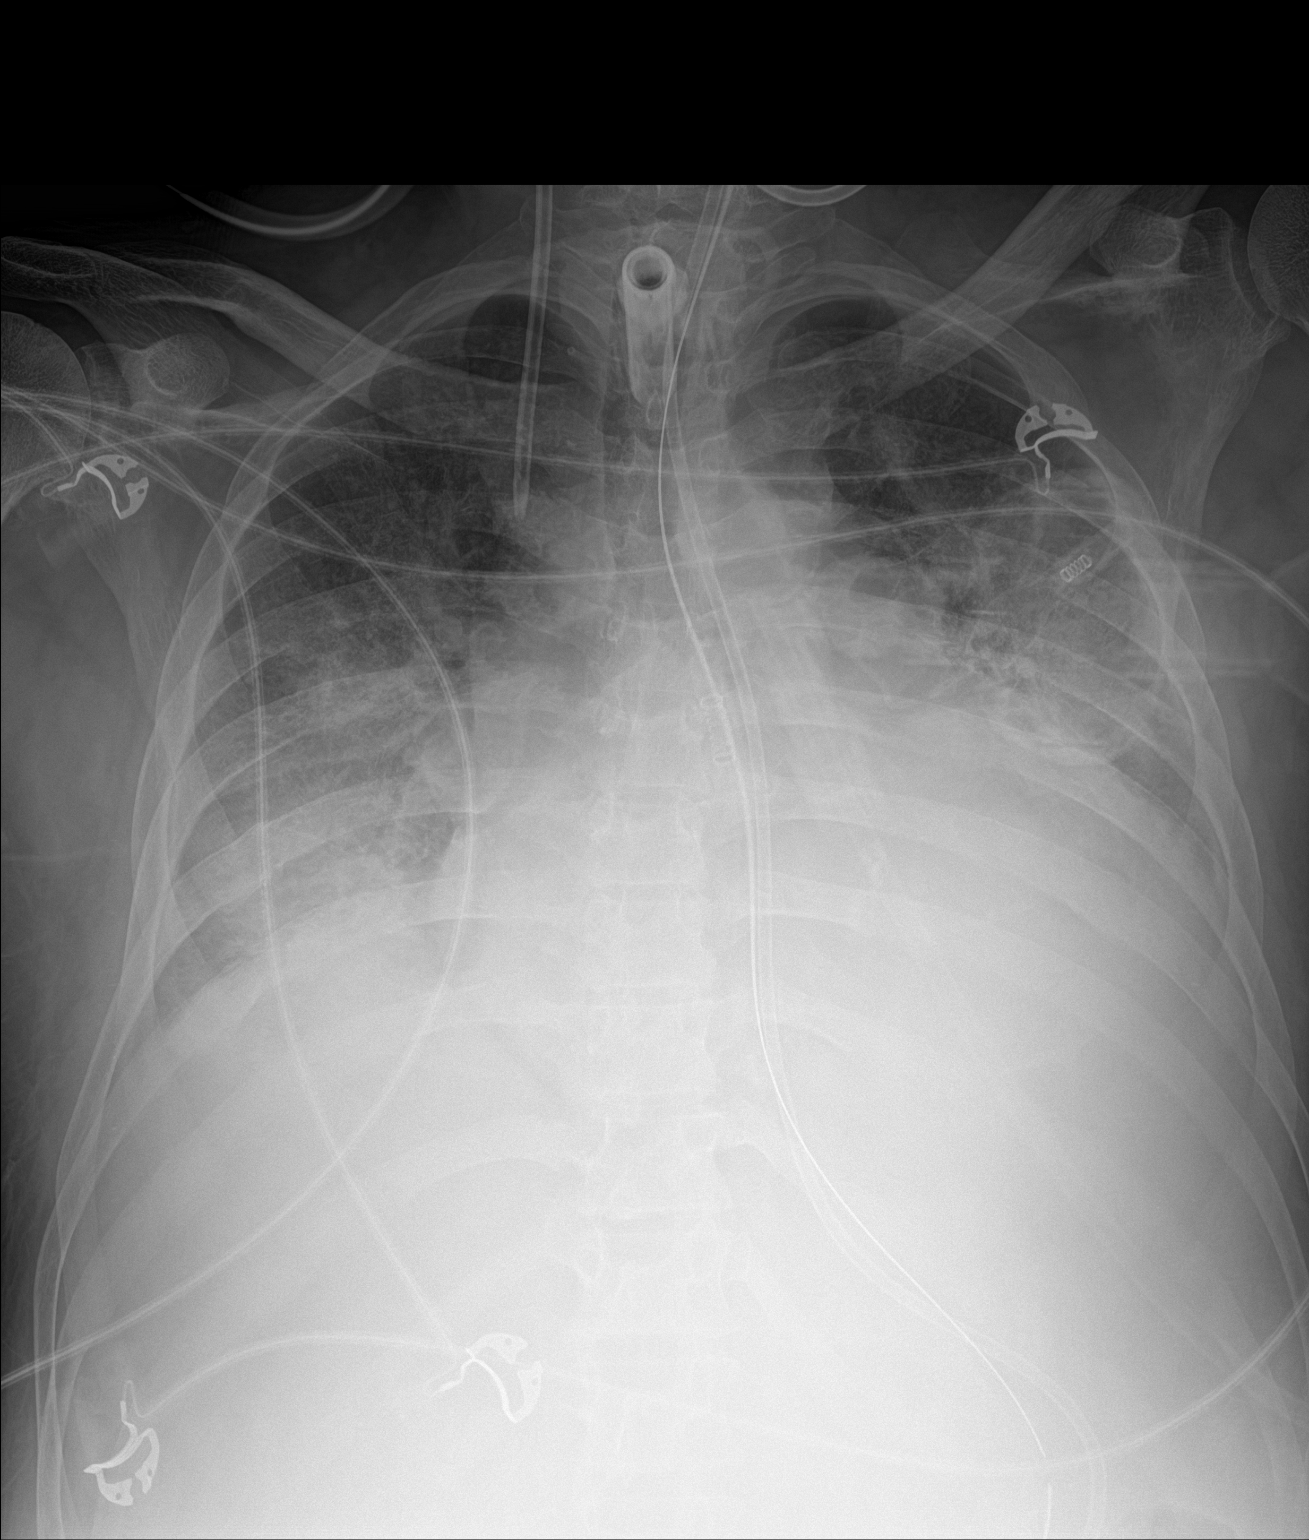

[1 of 1 positions shown; findings below may reference images not displayed]

FINDINGS: The tracheostomy tube tip is above the carina. There is a right IJ
catheter with tip projecting over the SVC. A feeding tube and
nasogastric tube are in place with tips below the GE junction.
Unchanged cardiac enlargement. Lung volumes are low. Bilateral
airspace opacities and layering posterior effusions are again noted
and appear unchanged.
IMPRESSION: 1. No change in aeration to the lungs compared with previous exam.
2. Stable cardiac enlargement
3. Stable support apparatus.

## 2020-04-23 NOTE — Telephone Encounter (Signed)
Ok thanks. Is there a number that I can reach her ?

## 2020-04-23 NOTE — Telephone Encounter (Signed)
Dr. Eileen Stanford,  The person who called asking for Provider info is from the [facility].

## 2020-04-27 NOTE — Telephone Encounter (Signed)
Farrel Gordon, RN, [facility] (listed on [employer] under [place])   [phone]

## 2020-04-30 ENCOUNTER — Ambulatory Visit: Payer: Medicare Other | Admitting: Surgery

## 2020-05-07 ENCOUNTER — Encounter
Payer: Medicare Other | Attending: Physical Medicine and Rehabilitation | Admitting: Physical Medicine and Rehabilitation

## 2020-05-07 DIAGNOSIS — I96 Gangrene, not elsewhere classified: Secondary | ICD-10-CM | POA: Insufficient documentation

## 2020-05-07 DIAGNOSIS — R5381 Other malaise: Secondary | ICD-10-CM | POA: Insufficient documentation

## 2020-05-15 DEATH — deceased

## 2020-08-20 IMAGING — CR DG CHEST 1V
1 series · 1 of 1 positions shown · non-contrast
Comparison: 04/13/2020

CLINICAL DATA: Status post RIGHT thoracentesis

EXAM:
CHEST  1 VIEW

[chest ap]
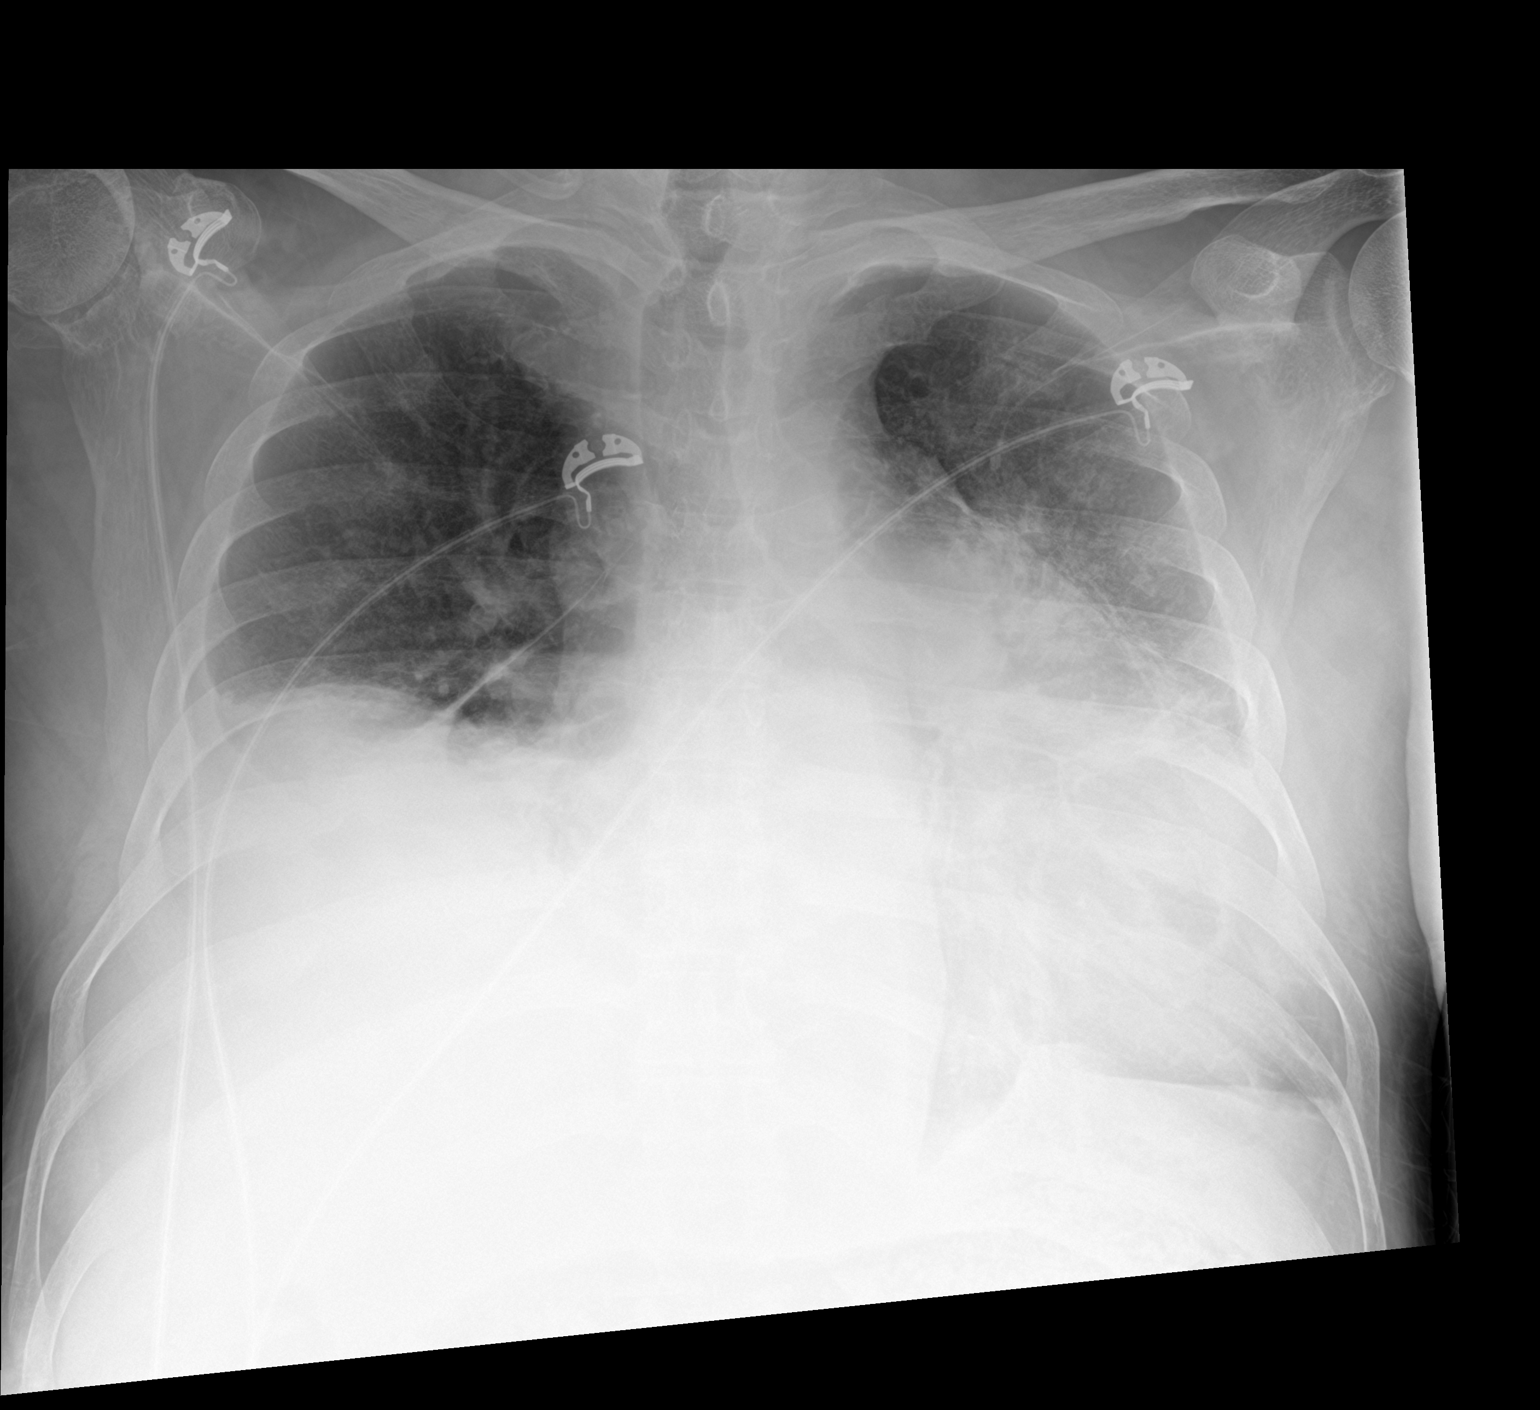

[1 of 1 positions shown; findings below may reference images not displayed]

FINDINGS: RIGHT pleural effusion appears decreased. There is no evidence of
pneumothorax.

Bilateral LOWER lung opacity/atelectasis again noted.

No other changes identified.
IMPRESSION: Decreased RIGHT pleural effusion. No evidence of pneumothorax.

## 2020-08-26 IMAGING — DX DG CHEST 1V PORT
1 series · 1 of 1 positions shown · non-contrast
Comparison: April 17, 2020.

CLINICAL DATA: Respiratory failure.

EXAM:
PORTABLE CHEST 1 VIEW

[chest]
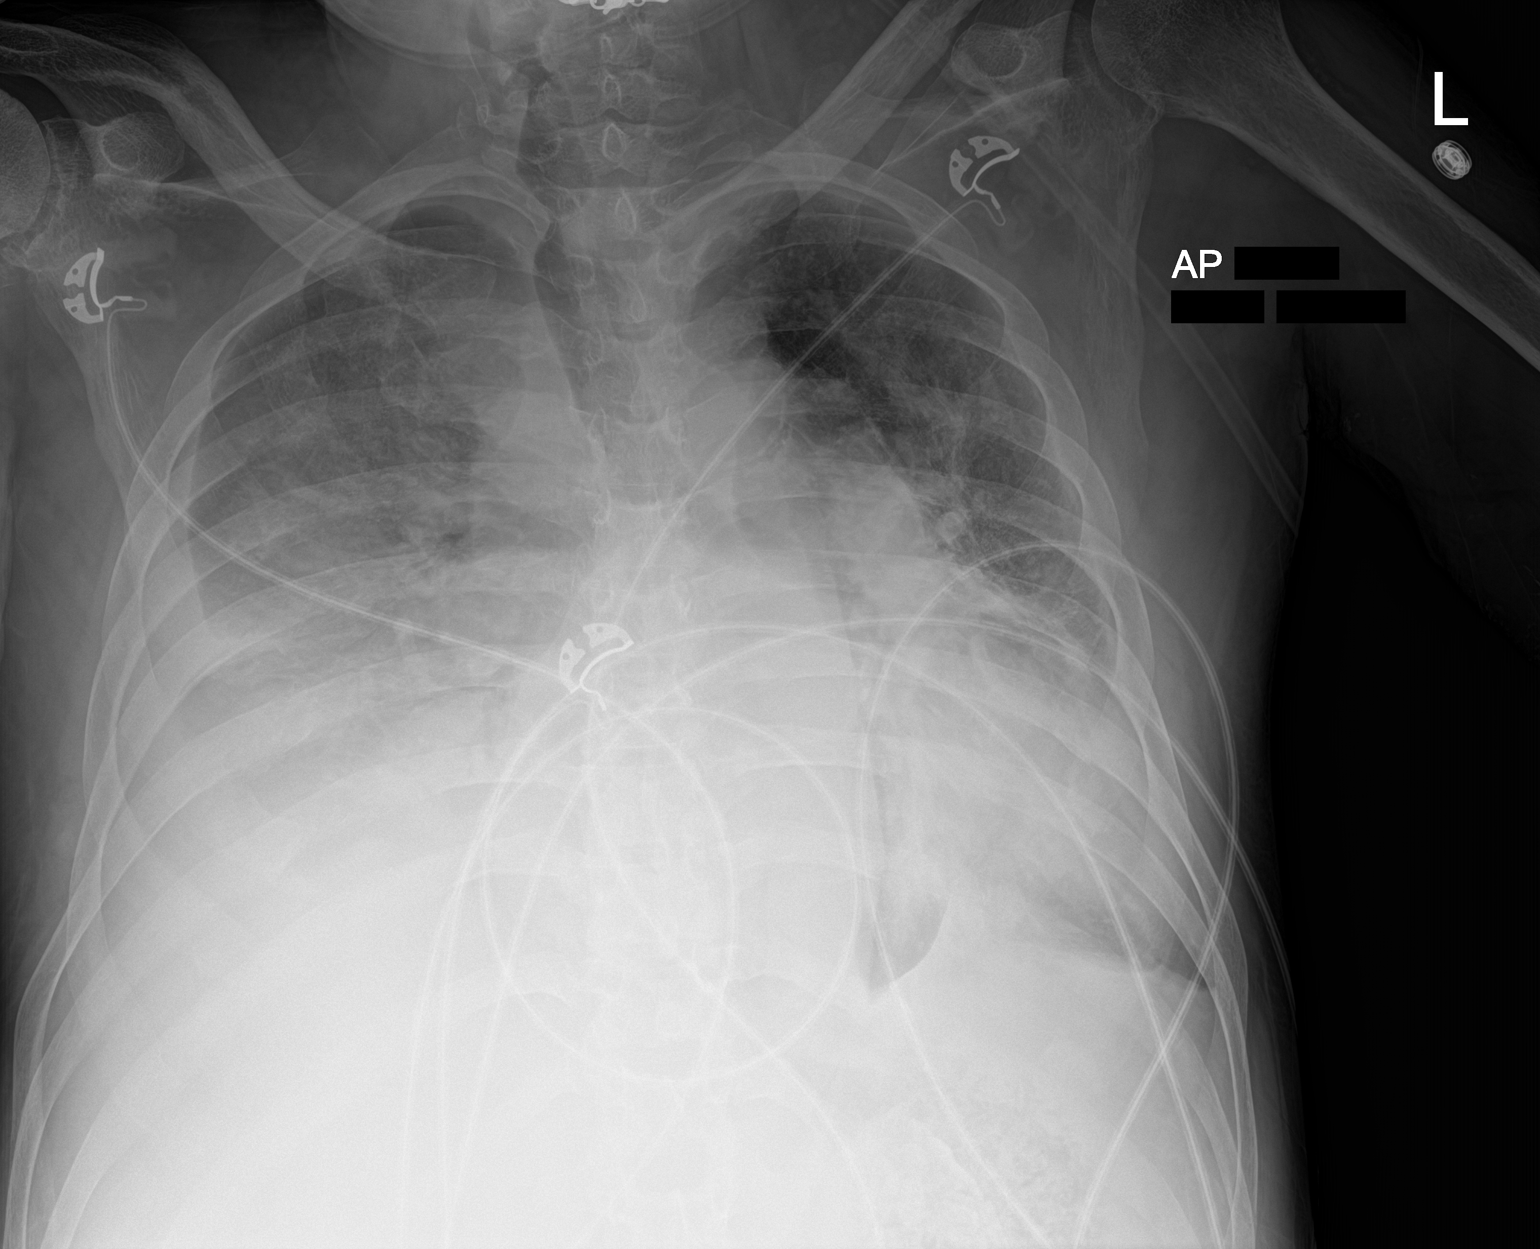

[1 of 1 positions shown; findings below may reference images not displayed]

FINDINGS: Stable cardiomegaly. No pneumothorax is noted. Moderate right
pleural effusion is noted with increased right upper lobe opacity
concerning for edema or pneumonia. Stable left perihilar and basilar
opacity is noted concerning for atelectasis or infiltrate. Bony
thorax is unremarkable.
IMPRESSION: Stable moderate right pleural effusion is noted with increased right
upper lobe opacity concerning for edema or pneumonia. Stable left
perihilar and basilar opacity is noted concerning for atelectasis or
infiltrate.
